# Patient Record
Sex: Female | Born: 1950
Health system: Southern US, Community
[De-identification: ages and names within clinical notes are randomized; demographics above are authoritative.]

## PROBLEM LIST (undated history)

## (undated) DIAGNOSIS — I4510 Unspecified right bundle-branch block: Secondary | ICD-10-CM

## (undated) DIAGNOSIS — F32A Depression, unspecified: Secondary | ICD-10-CM

## (undated) DIAGNOSIS — F329 Major depressive disorder, single episode, unspecified: Secondary | ICD-10-CM

## (undated) DIAGNOSIS — D649 Anemia, unspecified: Secondary | ICD-10-CM

## (undated) DIAGNOSIS — E785 Hyperlipidemia, unspecified: Secondary | ICD-10-CM

## (undated) DIAGNOSIS — R011 Cardiac murmur, unspecified: Secondary | ICD-10-CM

## (undated) DIAGNOSIS — R7303 Prediabetes: Secondary | ICD-10-CM

## (undated) DIAGNOSIS — G5600 Carpal tunnel syndrome, unspecified upper limb: Secondary | ICD-10-CM

## (undated) DIAGNOSIS — T7840XA Allergy, unspecified, initial encounter: Secondary | ICD-10-CM

## (undated) DIAGNOSIS — J381 Polyp of vocal cord and larynx: Secondary | ICD-10-CM

## (undated) DIAGNOSIS — F17200 Nicotine dependence, unspecified, uncomplicated: Secondary | ICD-10-CM

## (undated) DIAGNOSIS — M1990 Unspecified osteoarthritis, unspecified site: Secondary | ICD-10-CM

## (undated) DIAGNOSIS — K449 Diaphragmatic hernia without obstruction or gangrene: Secondary | ICD-10-CM

## (undated) DIAGNOSIS — K644 Residual hemorrhoidal skin tags: Secondary | ICD-10-CM

## (undated) DIAGNOSIS — K648 Other hemorrhoids: Secondary | ICD-10-CM

## (undated) DIAGNOSIS — F419 Anxiety disorder, unspecified: Secondary | ICD-10-CM

## (undated) DIAGNOSIS — I739 Peripheral vascular disease, unspecified: Secondary | ICD-10-CM

## (undated) DIAGNOSIS — I10 Essential (primary) hypertension: Secondary | ICD-10-CM

## (undated) DIAGNOSIS — K5790 Diverticulosis of intestine, part unspecified, without perforation or abscess without bleeding: Secondary | ICD-10-CM

## (undated) DIAGNOSIS — K5900 Constipation, unspecified: Secondary | ICD-10-CM

## (undated) DIAGNOSIS — D126 Benign neoplasm of colon, unspecified: Secondary | ICD-10-CM

## (undated) DIAGNOSIS — K219 Gastro-esophageal reflux disease without esophagitis: Secondary | ICD-10-CM

## (undated) HISTORY — DX: Diverticulosis of intestine, part unspecified, without perforation or abscess without bleeding: K57.90

## (undated) HISTORY — DX: Diaphragmatic hernia without obstruction or gangrene: K44.9

## (undated) HISTORY — PX: POLYPECTOMY: SHX149

## (undated) HISTORY — DX: Hyperlipidemia, unspecified: E78.5

## (undated) HISTORY — DX: Gastro-esophageal reflux disease without esophagitis: K21.9

## (undated) HISTORY — DX: Unspecified right bundle-branch block: I45.10

## (undated) HISTORY — DX: Depression, unspecified: F32.A

## (undated) HISTORY — DX: Unspecified osteoarthritis, unspecified site: M19.90

## (undated) HISTORY — DX: Benign neoplasm of colon, unspecified: D12.6

## (undated) HISTORY — DX: Essential (primary) hypertension: I10

## (undated) HISTORY — PX: COLONOSCOPY: SHX174

## (undated) HISTORY — PX: JOINT REPLACEMENT: SHX530

## (undated) HISTORY — DX: Allergy, unspecified, initial encounter: T78.40XA

## (undated) HISTORY — DX: Nicotine dependence, unspecified, uncomplicated: F17.200

## (undated) HISTORY — DX: Constipation, unspecified: K59.00

## (undated) HISTORY — PX: TUBAL LIGATION: SHX77

## (undated) HISTORY — DX: Cardiac murmur, unspecified: R01.1

## (undated) HISTORY — DX: Anemia, unspecified: D64.9

## (undated) HISTORY — PX: OTHER SURGICAL HISTORY: SHX169

## (undated) HISTORY — DX: Major depressive disorder, single episode, unspecified: F32.9

## (undated) HISTORY — DX: Peripheral vascular disease, unspecified: I73.9

## (undated) HISTORY — DX: Residual hemorrhoidal skin tags: K64.4

## (undated) HISTORY — DX: Other hemorrhoids: K64.8

## (undated) HISTORY — PX: HEMORRHOID SURGERY: SHX153

## (undated) HISTORY — DX: Polyp of vocal cord and larynx: J38.1

## (undated) HISTORY — DX: Anxiety disorder, unspecified: F41.9

---

## 1992-06-30 HISTORY — PX: ABDOMINAL HYSTERECTOMY: SHX81

## 2001-08-11 ENCOUNTER — Encounter: Admission: RE | Admit: 2001-08-11 | Discharge: 2001-08-11 | Payer: Self-pay | Admitting: Family Medicine

## 2001-08-11 ENCOUNTER — Encounter: Payer: Self-pay | Admitting: Family Medicine

## 2003-04-13 ENCOUNTER — Encounter: Admission: RE | Admit: 2003-04-13 | Discharge: 2003-04-13 | Payer: Self-pay | Admitting: Family Medicine

## 2003-04-13 ENCOUNTER — Encounter: Payer: Self-pay | Admitting: Family Medicine

## 2003-05-19 ENCOUNTER — Encounter: Admission: RE | Admit: 2003-05-19 | Discharge: 2003-05-19 | Payer: Self-pay | Admitting: Gastroenterology

## 2006-08-28 ENCOUNTER — Ambulatory Visit: Payer: Self-pay

## 2006-08-28 ENCOUNTER — Encounter: Payer: Self-pay | Admitting: Cardiology

## 2007-07-01 HISTORY — PX: COLONOSCOPY W/ POLYPECTOMY: SHX1380

## 2007-08-11 ENCOUNTER — Emergency Department (HOSPITAL_COMMUNITY): Admission: EM | Admit: 2007-08-11 | Discharge: 2007-08-12 | Payer: Self-pay | Admitting: Emergency Medicine

## 2007-08-22 ENCOUNTER — Ambulatory Visit (HOSPITAL_COMMUNITY): Admission: RE | Admit: 2007-08-22 | Discharge: 2007-08-22 | Payer: Self-pay | Admitting: Orthopaedic Surgery

## 2008-06-02 ENCOUNTER — Ambulatory Visit (HOSPITAL_COMMUNITY): Admission: RE | Admit: 2008-06-02 | Discharge: 2008-06-02 | Payer: Self-pay | Admitting: *Deleted

## 2008-06-02 ENCOUNTER — Encounter (INDEPENDENT_AMBULATORY_CARE_PROVIDER_SITE_OTHER): Payer: Self-pay | Admitting: *Deleted

## 2009-07-06 ENCOUNTER — Encounter
Admission: RE | Admit: 2009-07-06 | Discharge: 2009-07-06 | Payer: Self-pay | Admitting: Physical Medicine and Rehabilitation

## 2009-08-16 ENCOUNTER — Inpatient Hospital Stay (HOSPITAL_COMMUNITY): Admission: RE | Admit: 2009-08-16 | Discharge: 2009-08-18 | Payer: Self-pay | Admitting: Neurosurgery

## 2009-08-16 HISTORY — PX: SPINE SURGERY: SHX786

## 2010-09-19 LAB — BASIC METABOLIC PANEL
BUN: 3 mg/dL — ABNORMAL LOW (ref 6–23)
CO2: 32 mEq/L (ref 19–32)
Calcium: 9.9 mg/dL (ref 8.4–10.5)
Chloride: 99 mEq/L (ref 96–112)
Creatinine, Ser: 0.83 mg/dL (ref 0.4–1.2)
GFR calc Af Amer: >60 mL/min (ref >60)
GFR calc non Af Amer: >60 mL/min (ref >60)
Glucose, Bld: 104 mg/dL — ABNORMAL HIGH (ref 70–99)
Potassium: 3.5 mEq/L (ref 3.5–5.1)
Sodium: 138 mEq/L (ref 135–145)

## 2010-09-19 LAB — CBC
HCT: 40.4 % (ref 36.0–46.0)
Hemoglobin: 13.6 g/dL (ref 12.0–15.0)
MCHC: 33.7 g/dL (ref 30.0–36.0)
MCV: 86.6 fL (ref 78.0–100.0)
Platelets: 286 10*3/uL (ref 150–400)
RBC: 4.66 MIL/uL (ref 3.87–5.11)
RDW: 13 % (ref 11.5–15.5)
WBC: 8.3 10*3/uL (ref 4.0–10.5)

## 2010-09-19 LAB — TYPE AND SCREEN
ABO/RH(D): B POS
Antibody Screen: NEGATIVE

## 2010-09-19 LAB — ABO/RH: ABO/RH(D): B POS

## 2010-11-12 NOTE — Op Note (Signed)
NAMEIQRA, Hannah [name]               ACCOUNT NO.:  [id number]   MEDICAL RECORD NO.:  [id number]          PATIENT TYPE:  AMB   LOCATION:  DAY                          FACILITY:  [facility]   PHYSICIAN:  Alfonse Ras, MD   DATE OF BIRTH:  Dec 06, 1950   DATE OF PROCEDURE:  DATE OF DISCHARGE:                               OPERATIVE REPORT   PREOPERATIVE DIAGNOSES:  Fibroepithelial polyp and grade 3 internal  hemorrhoids with prolapse.   PROCEDURE:  Excision of hemorrhoidal polyp and PPH rectopexy.   SURGEON:  Alfonse Ras, M.D.   ANESTHESIA:  General.   ASSISTANT:  Lennie Muckle, M.D.   DESCRIPTION:  After informed consent was granted, the patient was taken  to the operating room and general anesthesia was induced.  Perianal and  rectal prep were undertaken.  Anal dilatation was accomplished to three  fingerbreadths and the hemorrhoidal bundles were injected with 0.25  Marcaine with Wydase.  The large posterior hemorrhoidal polyp was  clamped at its base and excised.  This was oversewn with a running 3-0  chromic suture.   A 2-0 Prolene purse-string suture was placed in submucosa approximately  5 cm proximal to the dentate line and circumferentially.  PPH stapler  was introduced and the purse-string was tied down around the anvil.  This was closed down, held in place for 45 seconds.  The vagina was  checked.  There was no evidence of rectovaginal involvement with a  stapler.  This was fired.  Staple line was inspected, was felt to be at  least 2.5 cm proximal to the dentate line.  Adequate hemostasis was  ensured.  Gelfoam packing was placed.  Internal and external sphincter  muscles were injected with the remaining 0.25% Marcaine.  The patient  tolerated the procedure well, went to PACU in good condition.      Alfonse Ras, MD  Electronically Signed     KRE/MEDQ  D:  06/02/2008  T:  06/02/2008  Job:  [id number]

## 2011-04-04 LAB — BASIC METABOLIC PANEL
BUN: 5 mg/dL — ABNORMAL LOW (ref 6–23)
CO2: 30 mEq/L (ref 19–32)
Calcium: 9.1 mg/dL (ref 8.4–10.5)
Chloride: 102 mEq/L (ref 96–112)
Creatinine, Ser: 0.73 mg/dL (ref 0.4–1.2)
GFR calc Af Amer: >60 mL/min (ref >60)
GFR calc non Af Amer: >60 mL/min (ref >60)
Glucose, Bld: 113 mg/dL — ABNORMAL HIGH (ref 70–99)
Potassium: 2.7 mEq/L — CL (ref 3.5–5.1)
Sodium: 140 mEq/L (ref 135–145)

## 2011-04-04 LAB — HEMOGLOBIN AND HEMATOCRIT, BLOOD
HCT: 38.9 % (ref 36.0–46.0)
Hemoglobin: 12.7 g/dL (ref 12.0–15.0)

## 2011-06-26 ENCOUNTER — Ambulatory Visit (INDEPENDENT_AMBULATORY_CARE_PROVIDER_SITE_OTHER): Payer: PRIVATE HEALTH INSURANCE

## 2011-06-26 DIAGNOSIS — M2340 Loose body in knee, unspecified knee: Secondary | ICD-10-CM

## 2011-06-26 DIAGNOSIS — M25569 Pain in unspecified knee: Secondary | ICD-10-CM

## 2011-10-01 ENCOUNTER — Ambulatory Visit (INDEPENDENT_AMBULATORY_CARE_PROVIDER_SITE_OTHER): Payer: PRIVATE HEALTH INSURANCE | Admitting: Emergency Medicine

## 2011-10-01 VITALS — BP 166/87 | HR 97 | Temp 98.2°F | Resp 18 | Ht 61.0 in | Wt 124.5 lb

## 2011-10-01 DIAGNOSIS — J40 Bronchitis, not specified as acute or chronic: Secondary | ICD-10-CM

## 2011-10-01 DIAGNOSIS — J209 Acute bronchitis, unspecified: Secondary | ICD-10-CM

## 2011-10-01 DIAGNOSIS — J029 Acute pharyngitis, unspecified: Secondary | ICD-10-CM

## 2011-10-01 LAB — POCT INFLUENZA A/B
Influenza A, POC: NEGATIVE
Influenza B, POC: NEGATIVE

## 2011-10-01 LAB — POCT RAPID STREP A (OFFICE): Rapid Strep A Screen: NEGATIVE

## 2011-10-01 MED ORDER — LEVOFLOXACIN 500 MG PO TABS: 500.0000 mg | ORAL_TABLET | Freq: Every day | ORAL | Status: AC

## 2011-10-01 MED ORDER — BENZONATATE 200 MG PO CAPS: 200.0000 mg | ORAL_CAPSULE | Freq: Three times a day (TID) | ORAL | Status: AC | PRN

## 2011-10-01 NOTE — Progress Notes (Signed)
  Subjective:    Patient ID: Hannah [name], female    DOB: Dec 14, 1950, 61 y.o.   MRN: [id number]  HPI patient enters with a one-week history of head congestion sore throat myalgias. She states she does not think she's had a fever but she has had chills. She feels weak no energy with a dry nonproductive cough.    Review of Systems noncontributory except as relates to the present illness     Objective:   Physical Exam  Constitutional: She appears well-developed and well-nourished.  HENT:  Right Ear: External ear normal.  Left Ear: External ear normal.  Eyes: Pupils are equal, round, and reactive to light.  Neck: Normal range of motion. No tracheal deviation present. No thyromegaly present.       Patient has bilateral anterior cervical nodes and a few shotty posterior cervical nodes  Cardiovascular: Normal rate and regular rhythm.   Pulmonary/Chest: No respiratory distress. She has no wheezes. She has no rales.  Lymphadenopathy:    She has cervical adenopathy.          Assessment & Plan:

## 2011-11-07 ENCOUNTER — Ambulatory Visit (INDEPENDENT_AMBULATORY_CARE_PROVIDER_SITE_OTHER): Payer: PRIVATE HEALTH INSURANCE | Admitting: Family Medicine

## 2011-11-07 ENCOUNTER — Ambulatory Visit: Payer: PRIVATE HEALTH INSURANCE

## 2011-11-07 VITALS — BP 168/83 | HR 85 | Temp 98.6°F | Resp 18 | Ht 61.25 in | Wt 124.0 lb

## 2011-11-07 DIAGNOSIS — M1710 Unilateral primary osteoarthritis, unspecified knee: Secondary | ICD-10-CM

## 2011-11-07 DIAGNOSIS — M79642 Pain in left hand: Secondary | ICD-10-CM

## 2011-11-07 DIAGNOSIS — M25569 Pain in unspecified knee: Secondary | ICD-10-CM

## 2011-11-07 DIAGNOSIS — M79609 Pain in unspecified limb: Secondary | ICD-10-CM

## 2011-11-07 DIAGNOSIS — IMO0002 Reserved for concepts with insufficient information to code with codable children: Secondary | ICD-10-CM

## 2011-11-07 DIAGNOSIS — M179 Osteoarthritis of knee, unspecified: Secondary | ICD-10-CM

## 2011-11-07 DIAGNOSIS — M25559 Pain in unspecified hip: Secondary | ICD-10-CM

## 2011-11-07 MED ORDER — ROBAXIN 500 MG PO TABS: 500.0000 mg | ORAL_TABLET | Freq: Two times a day (BID) | ORAL | Status: AC | PRN

## 2011-11-07 NOTE — Progress Notes (Signed)
[facility]:  Office Visit  Chief Complaint:  Chief Complaint  Patient presents with  . Hip Pain    fell on right side, is in a lot of pain    HPI: Hannah [name] is a 61 y.o. female who complains of  : 1. Bilateral hip pain s/p fall 2. Bilateral knee pain x 3 weeks s/p fall. 3. Left hand pain s/p fall  She fell out of SUV trying to get in b/c too tall. No weakness. + numbness on left hand. + pain, throbiing, constant. Worse when walking. H/o Oa but not osteoporosis.   Past Medical History  Diagnosis Date  . Anxiety   . Depression   . Arthritis   . Hypertension    Past Surgical History  Procedure Date  . Spine surgery    History   Social History  . Marital Status: Divorced    Spouse Name: N/A    Number of Children: N/A  . Years of Education: N/A   Social History Main Topics  . Smoking status: Current Everyday Smoker -- 0.2 packs/day for 20 years    Types: Cigarettes  . Smokeless tobacco: Never Used  . Alcohol Use: No  . Drug Use: No  . Sexually Active: None   Other Topics Concern  . None   Social History Narrative  . None   Family History  Problem Relation Age of Onset  . Cancer Father    Allergies  Allergen Reactions  . Amlodipine   . Chantix (Varenicline Tartrate) Other (See Comments)    Tongue swell,sob  . Clarithromycin Rash  . Lisinopril Hives  . Simvastatin Hives  . Wellbutrin (Bupropion Hcl) Hives   Prior to Admission medications   Medication Sig Start Date End Date Taking? Authorizing Provider  ALPRAZolam Prudy Feeler) 0.5 MG tablet Take 0.5 mg by mouth at bedtime as needed.   Yes Historical Provider, MD  divalproex (DEPAKOTE) 500 MG DR tablet Take 500 mg by mouth 1 day or 1 dose.   Yes Historical Provider, MD  estradiol (ESTRACE) 1 MG tablet Take 1 mg by mouth daily.   Yes Historical Provider, MD  fenofibrate 54 MG tablet Take 54 mg by mouth daily.   Yes Historical Provider, MD  lamoTRIgine (LAMICTAL) 150 MG tablet Take  150 mg by mouth daily.   Yes Historical Provider, MD  PARoxetine (PAXIL) 10 MG tablet Take 5 mg by mouth every morning.   Yes Historical Provider, MD     ROS: The patient denies fevers, chills, night sweats, unintentional weight loss, chest pain, palpitations, wheezing, dyspnea on exertion, nausea, vomiting, abdominal pain, dysuria, hematuria, melena, numbness, weakness, or tingling. + knee pain  All other systems have been reviewed and were otherwise negative with the exception of those mentioned in the HPI and as above.    PHYSICAL EXAM: Filed Vitals:   11/07/11 1533  BP: 168/83  Pulse: 85  Temp: 98.6 F (37 C)  Resp: 18   Filed Vitals:   11/07/11 1533  Height: 5' 1.25" (1.556 m)  Weight: 124 lb (56.246 kg)   Body mass index is 23.24 kg/(m^2).  General: Alert, no acute distress HEENT:  Normocephalic, atraumatic, oropharynx patent.  Cardiovascular:  Regular rate and rhythm, no rubs murmurs or gallops.  No Carotid bruits, radial pulse intact. No pedal edema.  Respiratory: Clear to auscultation bilaterally.  No wheezes, rales, or rhonchi.  No cyanosis, no use of accessory musculature GI: No organomegaly, abdomen is soft and non-tender, positive  bowel sounds.  No masses. Skin: No rashes. Neurologic: Facial musculature symmetric. Psychiatric: Patient is appropriate throughout our interaction. Lymphatic: No cervical lymphadenopathy Musculoskeletal: Gait intact. Bilateral knee: No effusion, decrease PROM/AROM in flexion, extension due to pain, + anterior knee tenderness on palpation specifically medial compartment, neg McMurray/jt line tenderness, Neg Lachmans Neurovascualrly intact: post tib a/BP , senstation, 5/5 strength. 2/2 DTr intact   LABS:    EKG/XRAY:   Primary read interpreted by Dr. Conley Rolls at [facility]. No fx/dislocation  DJD of knee bilaterally, djd of hip, sacroilitis  ASSESSMENT/PLAN: Encounter Diagnoses  Name Primary?  . Hip pain Yes  . Knee pain   . Hand pain,  left   . Osteoarthritis of knee    Knee and hand pain related to OA Patient verbally consented to bilateral knee injections. Risks and benefits d/w patient. No h/o diabetes. 1 ml of Depomedrol and 2 ml of 1% lidocaine without epi.  Per injection. Lateral knee approach. Prepped with betadine and alcohol. No complications.   Rx for Robaxen 500 mg BID prn She still has some hydrocodone from Dr. Renae Fickle, will not prescribe any narcotic meds.  Advise to check BP at home, if continues to be greater than 140/90 consistently then need f/u for BP meds.   Hamilton Capri PHUONG, DO 11/09/2011 6:39 AM

## 2011-12-29 ENCOUNTER — Ambulatory Visit (INDEPENDENT_AMBULATORY_CARE_PROVIDER_SITE_OTHER): Payer: PRIVATE HEALTH INSURANCE | Admitting: Internal Medicine

## 2011-12-29 VITALS — BP 154/96 | HR 84 | Temp 98.9°F | Resp 16 | Ht 62.0 in | Wt 126.0 lb

## 2011-12-29 DIAGNOSIS — Z720 Tobacco use: Secondary | ICD-10-CM

## 2011-12-29 DIAGNOSIS — J029 Acute pharyngitis, unspecified: Secondary | ICD-10-CM

## 2011-12-29 DIAGNOSIS — R490 Dysphonia: Secondary | ICD-10-CM

## 2011-12-29 DIAGNOSIS — J329 Chronic sinusitis, unspecified: Secondary | ICD-10-CM

## 2011-12-29 DIAGNOSIS — I10 Essential (primary) hypertension: Secondary | ICD-10-CM

## 2011-12-29 DIAGNOSIS — D649 Anemia, unspecified: Secondary | ICD-10-CM

## 2011-12-29 LAB — POCT CBC
Granulocyte percent: 46.9 %G (ref 37–80)
HCT, POC: 38.5 % (ref 37.7–47.9)
Hemoglobin: 11.3 g/dL — AB (ref 12.2–16.2)
Lymph, poc: 2.8 (ref 0.6–3.4)
MCH, POC: 25.7 pg — AB (ref 27–31.2)
MCHC: 29.4 g/dL — AB (ref 31.8–35.4)
MCV: 87.8 fL (ref 80–97)
MID (cbc): 0.5 (ref 0–0.9)
MPV: 8.3 fL (ref 0–99.8)
POC Granulocyte: 2.9 (ref 2–6.9)
POC LYMPH PERCENT: 45.1 %L (ref 10–50)
POC MID %: 8 %M (ref 0–12)
Platelet Count, POC: 269 10*3/uL (ref 142–424)
RBC: 4.39 M/uL (ref 4.04–5.48)
RDW, POC: 14.9 %
WBC: 6.1 10*3/uL (ref 4.6–10.2)

## 2011-12-29 LAB — POCT RAPID STREP A (OFFICE): Rapid Strep A Screen: NEGATIVE

## 2011-12-29 MED ORDER — AMOXICILLIN 875 MG PO TABS: 875.0000 mg | ORAL_TABLET | Freq: Two times a day (BID) | ORAL | Status: AC

## 2011-12-29 MED ORDER — BISOPROLOL-HYDROCHLOROTHIAZIDE 5-6.25 MG PO TABS: 1.0000 | ORAL_TABLET | Freq: Every day | ORAL | Status: DC

## 2011-12-29 NOTE — Progress Notes (Signed)
  Subjective:    Patient ID: Hannah [name], female    DOB: [birth year], 61 y.o.   MRN: [id number]  HPIHas a five-day history of headache with fullness in the ears and mild dizziness, nasal congestion with postnasal drainage, and mild sore throat. She also is concerned about prolonged worse with which was identified 4 years ago as being secondary to vocal polyps. She has not followed up with ear nose and throat and would like to see him again . Continues to smoke.  She has a history of hypertension but she discontinued her medicines last year.  She has a history of diagnosis of mood disorder treated with Depakote Lamictal and Paxil, but she was so lethargic on the medicine she stopped those last year as well and has actually done very well since then.    Review of SystemsThere is no problem list on file for this patient. No fever chills or night sweats No weight loss No cough No palpitations chest pain No peripheral edema       Objective:   Physical Exam In no acute distress Blood pressure 154/96 HEENT= pupils equal round reactive to light and accommodation with intact extraocular movements  TMs clear/nares Boggy with increased purulence/throat slightly injected No thyromegaly or lymphadenopathy Chest clear Heart regular with 2/6 systolic ejection murmur along the left sternal border and radiating to the carotids/she says this has been present for a long time/the chart reveals bilateral carotid stenosis with left worse than right/workup in [facility] has shown moderate aortic regurg with a sclerotic aortic valve and mild mitral regurg  No edema Neurological intact      Assessment & Plan:  Problem #1 sinusitis-With headache and pharyngitis Amoxicillin  Problem #2 hypertension Ziac 5/6.25  Problem #3 focal polyps Will arrange followup with Dr. Pollyann Kennedy  Problem #4 aortic regurg/carotid stenosis He is to followup with Dr. Alanda Amass  Problem #5 continued  nicotine abuse  2 followup here for blood pressure in 3 weeks

## 2011-12-30 ENCOUNTER — Telehealth: Payer: Self-pay

## 2011-12-30 NOTE — Telephone Encounter (Signed)
Ok for note today 

## 2011-12-30 NOTE — Telephone Encounter (Signed)
OOW note in pick up drawer, [facility] notifying patient.

## 2011-12-30 NOTE — Telephone Encounter (Signed)
Is it ok to give a work note.

## 2011-12-30 NOTE — Telephone Encounter (Signed)
PT STATES SHE WAS SEEN YESTERDAY AND DIDN'T GO TO WORK TODAY BECAUSE SHE STILL WASN'T FEELING GOOD NEED TO HAVE AN OOW NOTE. PLEASE CALL [phone]

## 2012-08-18 ENCOUNTER — Encounter: Payer: Self-pay | Admitting: Emergency Medicine

## 2012-10-08 ENCOUNTER — Ambulatory Visit: Payer: PRIVATE HEALTH INSURANCE

## 2012-10-08 ENCOUNTER — Ambulatory Visit (INDEPENDENT_AMBULATORY_CARE_PROVIDER_SITE_OTHER): Payer: PRIVATE HEALTH INSURANCE | Admitting: Emergency Medicine

## 2012-10-08 VITALS — BP 157/74 | HR 72 | Temp 98.2°F | Resp 18 | Ht 61.5 in | Wt 121.8 lb

## 2012-10-08 DIAGNOSIS — M25559 Pain in unspecified hip: Secondary | ICD-10-CM

## 2012-10-08 DIAGNOSIS — M25551 Pain in right hip: Secondary | ICD-10-CM

## 2012-10-08 DIAGNOSIS — M1990 Unspecified osteoarthritis, unspecified site: Secondary | ICD-10-CM | POA: Insufficient documentation

## 2012-10-08 DIAGNOSIS — I10 Essential (primary) hypertension: Secondary | ICD-10-CM

## 2012-10-08 DIAGNOSIS — M25569 Pain in unspecified knee: Secondary | ICD-10-CM

## 2012-10-08 DIAGNOSIS — J329 Chronic sinusitis, unspecified: Secondary | ICD-10-CM

## 2012-10-08 DIAGNOSIS — F39 Unspecified mood [affective] disorder: Secondary | ICD-10-CM | POA: Insufficient documentation

## 2012-10-08 DIAGNOSIS — M25561 Pain in right knee: Secondary | ICD-10-CM

## 2012-10-08 MED ORDER — BISOPROLOL-HYDROCHLOROTHIAZIDE 5-6.25 MG PO TABS: 1.0000 | ORAL_TABLET | Freq: Every day | ORAL | Status: DC

## 2012-10-08 MED ORDER — HYDROCODONE-ACETAMINOPHEN 5-325 MG PO TABS: 1.0000 | ORAL_TABLET | Freq: Four times a day (QID) | ORAL | Status: DC | PRN

## 2012-10-08 NOTE — Progress Notes (Signed)
  Subjective:    Patient ID: Hannah [name], female    DOB: 1950/12/08, 62 y.o.   MRN: [id number]  HPI Patient presents with pain in right knee x 2 weeks. Patient states there is fluid on her knee that causes the pain. She is also experiencing pain in her right hip from a fall one month ago. Patient has chronic problems with her right knee. She had an injection done in December by Dr. Renae Fickle and had fluid drawn off her knee as well as in injection of cortisone. She now has increasing knee pain but is having more pain in her lateral hip following her fall a month ago.  Review of Systems     Objective:   Physical Exam There is significant degenerative changes of both knees. There is inability to get full extension of the right knee there is a small amount of fluid in the knee itself. Drawer sign is negative McMurray testing is negative There is limited internal and external rotation of the right hip. There is significant tenderness over the trochanteric bursa.  The medial knee was prepped with Betadine numbed with ethyl chloride as well as 1 cc of 2% plain a 22-gauge needle was inserted and 10 cc of slightly yellow somewhat thickened fluid was removed and sent for analysis the   [facility] reading (PRIMARY) by  Dr.Kinza Gouveia there are degenerative changes around the right hip no fracture is seen.   Assessment & Plan:  Patient will take 3 Advil twice a day. I did give her #20 Norco to have her pain she will followup with Dr. Renae Fickle the end of May and have consideration for repeat steroid injection

## 2012-10-09 ENCOUNTER — Telehealth: Payer: Self-pay

## 2012-10-09 LAB — SYNOVIAL FLUID PANEL
Crystals, Fluid: NONE SEEN
Eosinophils-Synovial: 0 % (ref 0–1)
Glucose, Synovial Fluid: 74 mg/dL
Lymphocytes-Synovial Fld: 95 % — ABNORMAL HIGH (ref 0–20)
Monocyte/Macrophage: 0 % — ABNORMAL LOW (ref 50–90)
Neutrophil, Synovial: 5 % (ref 0–25)
Total protein, fluid: <3 g/dL
WBC, Synovial: 695 cu mm — ABNORMAL HIGH (ref 0–200)

## 2012-10-09 NOTE — Telephone Encounter (Signed)
Per pt seen by Dr Cleta Alberts, has questions about problem sweating with very bad odor. Please call Pt back to discuss. [phone]

## 2012-10-10 NOTE — Telephone Encounter (Signed)
Spoke with pt advised message from Dr. Daub. Pt understood. 

## 2012-10-10 NOTE — Telephone Encounter (Signed)
LMOM to CB. Dr Cleta Alberts states he can see her for this if she will RTC. It is sometimes caused by the medications people take. This would require another office visit to discuss.

## 2012-10-12 LAB — BODY FLUID CULTURE
Gram Stain: NONE SEEN
Organism ID, Bacteria: NO GROWTH

## 2012-11-24 ENCOUNTER — Encounter: Payer: Self-pay | Admitting: Emergency Medicine

## 2012-11-29 ENCOUNTER — Telehealth: Payer: Self-pay | Admitting: Family Medicine

## 2012-11-29 MED ORDER — ESTRADIOL 1 MG PO TABS: 1.0000 mg | ORAL_TABLET | Freq: Every day | ORAL | Status: DC

## 2012-11-29 NOTE — Telephone Encounter (Signed)
Rx Refilled  

## 2012-12-15 ENCOUNTER — Encounter: Payer: Self-pay | Admitting: Family Medicine

## 2012-12-15 ENCOUNTER — Ambulatory Visit (INDEPENDENT_AMBULATORY_CARE_PROVIDER_SITE_OTHER): Payer: PRIVATE HEALTH INSURANCE | Admitting: Family Medicine

## 2012-12-15 VITALS — BP 120/80 | HR 68 | Temp 97.7°F | Resp 18 | Ht 60.0 in | Wt 122.0 lb

## 2012-12-15 DIAGNOSIS — Z124 Encounter for screening for malignant neoplasm of cervix: Secondary | ICD-10-CM

## 2012-12-15 DIAGNOSIS — Z08 Encounter for follow-up examination after completed treatment for malignant neoplasm: Secondary | ICD-10-CM

## 2012-12-15 DIAGNOSIS — F17200 Nicotine dependence, unspecified, uncomplicated: Secondary | ICD-10-CM

## 2012-12-15 DIAGNOSIS — K5909 Other constipation: Secondary | ICD-10-CM

## 2012-12-15 DIAGNOSIS — Z720 Tobacco use: Secondary | ICD-10-CM

## 2012-12-15 DIAGNOSIS — K5900 Constipation, unspecified: Secondary | ICD-10-CM

## 2012-12-15 DIAGNOSIS — B977 Papillomavirus as the cause of diseases classified elsewhere: Secondary | ICD-10-CM

## 2012-12-15 DIAGNOSIS — Z01419 Encounter for gynecological examination (general) (routine) without abnormal findings: Secondary | ICD-10-CM

## 2012-12-15 DIAGNOSIS — Z13820 Encounter for screening for osteoporosis: Secondary | ICD-10-CM

## 2012-12-15 NOTE — Patient Instructions (Addendum)
Medical release for her psychiatrist- for labs and last office visit  Bone Density to be set up We will call if more labs are needed Work on the smoking Try senna-kot in the evening F/U 6 months

## 2012-12-16 DIAGNOSIS — B977 Papillomavirus as the cause of diseases classified elsewhere: Secondary | ICD-10-CM | POA: Insufficient documentation

## 2012-12-16 DIAGNOSIS — K5909 Other constipation: Secondary | ICD-10-CM | POA: Insufficient documentation

## 2012-12-16 DIAGNOSIS — K5900 Constipation, unspecified: Secondary | ICD-10-CM | POA: Insufficient documentation

## 2012-12-16 NOTE — Assessment & Plan Note (Signed)
Discussed tobacco cessation 

## 2012-12-16 NOTE — Assessment & Plan Note (Signed)
She has used a few meds, often will not take in AM due to her job Advised sennakot

## 2012-12-16 NOTE — Assessment & Plan Note (Signed)
Pap Smear with HPV to be done

## 2012-12-16 NOTE — Progress Notes (Signed)
  Subjective:    Patient ID: Hannah [name], female    DOB: 1951/04/06, 62 y.o.   MRN: [id number]  HPI  Pt here for GYN exam. History of hysterectomy secondary to fibroids and bleeding. She has had positive HPV and has continued to have yearly PAP exams. Last year PAP Smear normal , +High risk HPV Mammogram is UTD, immunizations UTD Pt reports colonoscopy , records show she needs in 2-3 years Currently being seen by pyschiatry, medications reviewed. She had fasting labs done by psych 4 weeks ago per report. Due for Bone Density  Review of Systems    GEN- denies fatigue, fever, weight loss,weakness, recent illness HEENT- denies eye drainage, change in vision, nasal discharge, CVS- denies chest pain, palpitations RESP- denies SOB, cough, wheeze ABD- denies N/V, change in stools, abd pain, +constipation GU- denies dysuria, hematuria, dribbling, incontinence MSK- denies joint pain, muscle aches, injury Neuro- denies headache, dizziness, syncope, seizure activity      Objective:   Physical Exam  GEN- NAD, alert and oriented x3 HEENT- PERRL, EOMI, non injected sclera, pink conjunctiva, MMM, oropharynx clear Neck- Supple, no thyromegaly CVS- RRR, ?soft 2/6 SEM- sternal border  RESP-CTAB ABD-NABS,soft,NT,ND Breast- normal symmetry, no nipple inversion,no nipple drainage, no nodules or lumps felt Nodes- no axillary nodes GU- normal external genitalia, vaginal mucosa pink and moist no lesions seen, cervix and uterus absent, ovaries not palpable, urethra normal position, bladder in tact  EXT- No edema Pulses- Radial, DP- 2+         Assessment & Plan:    GYN Exam- PAP Smear done on vaginal cuff, High RIsk HPV surveillance. Mammo UTD  Send for Bone Density. Obtain labs from her psychiatrist, if Lipids missing pt will return to have done. Non fasting today. I can not locate her colonoscopy. Needs colonoscopy in 2015 based on previous notes

## 2012-12-17 ENCOUNTER — Other Ambulatory Visit: Payer: Self-pay | Admitting: Family Medicine

## 2012-12-17 DIAGNOSIS — E785 Hyperlipidemia, unspecified: Secondary | ICD-10-CM

## 2012-12-17 LAB — PAP IG AND HPV HIGH-RISK: HPV DNA High Risk: NOT DETECTED

## 2012-12-20 ENCOUNTER — Other Ambulatory Visit: Payer: Self-pay | Admitting: Family Medicine

## 2012-12-20 DIAGNOSIS — Z1211 Encounter for screening for malignant neoplasm of colon: Secondary | ICD-10-CM

## 2012-12-31 ENCOUNTER — Ambulatory Visit (INDEPENDENT_AMBULATORY_CARE_PROVIDER_SITE_OTHER): Payer: PRIVATE HEALTH INSURANCE | Admitting: Emergency Medicine

## 2012-12-31 VITALS — BP 118/68 | HR 60 | Temp 98.0°F | Resp 17 | Ht 61.5 in | Wt 122.0 lb

## 2012-12-31 DIAGNOSIS — F0630 Mood disorder due to known physiological condition, unspecified: Secondary | ICD-10-CM

## 2012-12-31 DIAGNOSIS — R5381 Other malaise: Secondary | ICD-10-CM

## 2012-12-31 DIAGNOSIS — R400 Somnolence: Secondary | ICD-10-CM

## 2012-12-31 DIAGNOSIS — I10 Essential (primary) hypertension: Secondary | ICD-10-CM

## 2012-12-31 DIAGNOSIS — R5383 Other fatigue: Secondary | ICD-10-CM

## 2012-12-31 DIAGNOSIS — D649 Anemia, unspecified: Secondary | ICD-10-CM

## 2012-12-31 DIAGNOSIS — R404 Transient alteration of awareness: Secondary | ICD-10-CM

## 2012-12-31 LAB — POCT CBC
Granulocyte percent: 54.7 %G (ref 37–80)
HCT, POC: 36.6 % — AB (ref 37.7–47.9)
Hemoglobin: 10.8 g/dL — AB (ref 12.2–16.2)
Lymph, poc: 1.8 (ref 0.6–3.4)
MCH, POC: 27.1 pg (ref 27–31.2)
MCHC: 29.5 g/dL — AB (ref 31.8–35.4)
MCV: 91.8 fL (ref 80–97)
MID (cbc): 0.4 (ref 0–0.9)
MPV: 8 fL (ref 0–99.8)
POC Granulocyte: 2.7 (ref 2–6.9)
POC LYMPH PERCENT: 37.3 %L (ref 10–50)
POC MID %: 8 %M (ref 0–12)
Platelet Count, POC: 213 10*3/uL (ref 142–424)
RBC: 3.99 M/uL — AB (ref 4.04–5.48)
RDW, POC: 13.9 %
WBC: 4.9 10*3/uL (ref 4.6–10.2)

## 2012-12-31 LAB — COMPREHENSIVE METABOLIC PANEL
ALT: 12 U/L (ref 0–35)
AST: 18 U/L (ref 0–37)
Albumin: 4.3 g/dL (ref 3.5–5.2)
Alkaline Phosphatase: 59 U/L (ref 39–117)
BUN: 8 mg/dL (ref 6–23)
CO2: 32 mEq/L (ref 19–32)
Calcium: 9.7 mg/dL (ref 8.4–10.5)
Chloride: 105 mEq/L (ref 96–112)
Creat: 0.92 mg/dL (ref 0.50–1.10)
Glucose, Bld: 103 mg/dL — ABNORMAL HIGH (ref 70–99)
Potassium: 4.6 mEq/L (ref 3.5–5.3)
Sodium: 144 mEq/L (ref 135–145)
Total Bilirubin: 0.3 mg/dL (ref 0.3–1.2)
Total Protein: 6.6 g/dL (ref 6.0–8.3)

## 2012-12-31 LAB — T4, FREE: Free T4: 0.99 ng/dL (ref 0.80–1.80)

## 2012-12-31 LAB — TSH: TSH: 1.062 u[IU]/mL (ref 0.350–4.500)

## 2012-12-31 LAB — GLUCOSE, POCT (MANUAL RESULT ENTRY): POC Glucose: 102 mg/dl — AB (ref 70–99)

## 2012-12-31 MED ORDER — PAROXETINE HCL 10 MG PO TABS: 10.0000 mg | ORAL_TABLET | ORAL | Status: DC

## 2012-12-31 NOTE — Progress Notes (Signed)
  Subjective:    Patient ID: Hannah [name], female    DOB: 26-Jun-1951, 62 y.o.   MRN: [id number]  HPI patient carries a diagnosis of mood disorder she enters today for a checkup. Her biggest problem is that she is extremely sleepy through the day. She has been able to get off of chronic pain medication use. She is sleepy all day. For a long time she was addicted to pain medications. She is currently under the care of Dr. Tomasa Rand for her mood disorder and family is concerned that she is too sleepy during the day due to her medications the.    Review of Systems     Objective:   Physical Exam patient is alert and cooperative in no distress neck is supple. Chest clear heart regular rate no murmurs.  Results for orders placed in visit on 12/31/12  POCT CBC      Result Value Range   WBC 4.9  4.6 - 10.2 K/uL   Lymph, poc 1.8  0.6 - 3.4   POC LYMPH PERCENT 37.3  10 - 50 %L   MID (cbc) 0.4  0 - 0.9   POC MID % 8.0  0 - 12 %M   POC Granulocyte 2.7  2 - 6.9   Granulocyte percent 54.7  37 - 80 %G   RBC 3.99 (*) 4.04 - 5.48 M/uL   Hemoglobin 10.8 (*) 12.2 - 16.2 g/dL   HCT, POC 78.2 (*) 95.6 - 47.9 %   MCV 91.8  80 - 97 fL   MCH, POC 27.1  27 - 31.2 pg   MCHC 29.5 (*) 31.8 - 35.4 g/dL   RDW, POC 21.3     Platelet Count, POC 213  142 - 424 K/uL   MPV 8.0  0 - 99.8 fL  GLUCOSE, POCT (MANUAL RESULT ENTRY)      Result Value Range   POC Glucose 102 (*) 70 - 99 mg/dl        Assessment & Plan:  Patient has daytime somnolence. I suspect these are medication related. She would like a second opinion regarding her diagnosis of a mood disorder. Referral made to Dr. Donell Beers for his attenuation. In the interim we'll go ahead and check routine labs decrease Paxil to 10 mg a day. She is slightly anemic at 10.8 have advised her to take him all the vitamin with iron one a day. She's going to schedule a complete physical .

## 2013-01-04 ENCOUNTER — Other Ambulatory Visit: Payer: Self-pay | Admitting: Family Medicine

## 2013-01-04 DIAGNOSIS — Z13820 Encounter for screening for osteoporosis: Secondary | ICD-10-CM

## 2013-01-27 ENCOUNTER — Other Ambulatory Visit: Payer: Self-pay | Admitting: Family Medicine

## 2013-01-27 DIAGNOSIS — E2839 Other primary ovarian failure: Secondary | ICD-10-CM

## 2013-02-09 ENCOUNTER — Encounter: Payer: Self-pay | Admitting: Internal Medicine

## 2013-02-09 ENCOUNTER — Ambulatory Visit (INDEPENDENT_AMBULATORY_CARE_PROVIDER_SITE_OTHER): Payer: PRIVATE HEALTH INSURANCE | Admitting: Family Medicine

## 2013-02-09 VITALS — BP 122/68 | HR 50 | Temp 97.9°F | Resp 16 | Ht 61.5 in | Wt 120.0 lb

## 2013-02-09 DIAGNOSIS — M436 Torticollis: Secondary | ICD-10-CM

## 2013-02-09 DIAGNOSIS — R51 Headache: Secondary | ICD-10-CM

## 2013-02-09 MED ORDER — CYCLOBENZAPRINE HCL 5 MG PO TABS: 5.0000 mg | ORAL_TABLET | Freq: Three times a day (TID) | ORAL | Status: DC | PRN

## 2013-02-09 MED ORDER — PREDNISONE 20 MG PO TABS: 20.0000 mg | ORAL_TABLET | Freq: Three times a day (TID) | ORAL | Status: DC

## 2013-02-09 NOTE — Progress Notes (Signed)
62 yo woman with 4 days of right sided headache radiating into right shoulder.  It began upon awakening, no trauma.  Objective:  NAD Neck supple, but stiff and she cannot rotate well to the right. Reflexes in upper extremities are normal Neck is tender at insertion of right SCM muscle  Assessment:  Torticollis. Plan:   Torticollis, acute - Plan: cyclobenzaprine (FLEXERIL) 5 MG tablet, predniSONE (DELTASONE) 20 MG tablet  Signed, Elvina Sidle, MD

## 2013-02-23 ENCOUNTER — Encounter: Payer: Self-pay | Admitting: *Deleted

## 2013-04-29 ENCOUNTER — Telehealth: Payer: Self-pay | Admitting: Family Medicine

## 2013-04-29 NOTE — Telephone Encounter (Signed)
Patient needs her Estradial 1 mg refilled.     [facility] [phone]

## 2013-05-02 ENCOUNTER — Other Ambulatory Visit: Payer: Self-pay | Admitting: Family Medicine

## 2013-05-03 ENCOUNTER — Telehealth: Payer: Self-pay

## 2013-05-03 NOTE — Telephone Encounter (Signed)
PT STATES THAT SHE NO LONGER HAS INSURANCE COVERAGE DUE TO THE LOSS OF HER JOB RECENTLY, SHE WOULD LIKE TO HAVE A REFILL ON estradiol (ESTRACE) 1 MG tablet.  [phone]   [facility] [address]

## 2013-05-05 MED ORDER — ESTRADIOL 1 MG PO TABS: 1.0000 mg | ORAL_TABLET | Freq: Every day | ORAL | Status: DC

## 2013-05-05 NOTE — Telephone Encounter (Signed)
Meds ordered this encounter  Medications  . estradiol (ESTRACE) 1 MG tablet    Sig: Take 1 tablet (1 mg total) by mouth daily.    Dispense:  30 tablet    Refill:  1    Order Specific Question:  Supervising Provider    Answer:  DOOLITTLE, ROBERT P [[id number]]

## 2013-05-05 NOTE — Telephone Encounter (Signed)
Please advise on the request/ the message taken on 11/4 was not routed until today.

## 2013-05-05 NOTE — Telephone Encounter (Signed)
Pt is calling back to check on status of her medication request call back number is [phone]

## 2013-05-06 ENCOUNTER — Encounter: Payer: Self-pay | Admitting: Internal Medicine

## 2013-07-19 ENCOUNTER — Ambulatory Visit (INDEPENDENT_AMBULATORY_CARE_PROVIDER_SITE_OTHER): Payer: No Typology Code available for payment source | Admitting: Internal Medicine

## 2013-07-19 VITALS — BP 110/64 | HR 65 | Temp 98.3°F | Resp 18 | Ht 60.5 in | Wt 122.0 lb

## 2013-07-19 DIAGNOSIS — L089 Local infection of the skin and subcutaneous tissue, unspecified: Secondary | ICD-10-CM

## 2013-07-19 DIAGNOSIS — N760 Acute vaginitis: Secondary | ICD-10-CM

## 2013-07-19 DIAGNOSIS — F17200 Nicotine dependence, unspecified, uncomplicated: Secondary | ICD-10-CM

## 2013-07-19 DIAGNOSIS — R42 Dizziness and giddiness: Secondary | ICD-10-CM

## 2013-07-19 DIAGNOSIS — L723 Sebaceous cyst: Secondary | ICD-10-CM

## 2013-07-19 LAB — POCT WET PREP WITH KOH
KOH Prep POC: NEGATIVE
RBC Wet Prep HPF POC: NEGATIVE
Trichomonas, UA: NEGATIVE
WBC Wet Prep HPF POC: NEGATIVE
Yeast Wet Prep HPF POC: NEGATIVE

## 2013-07-19 MED ORDER — DOXYCYCLINE HYCLATE 100 MG PO TABS: 100.0000 mg | ORAL_TABLET | Freq: Two times a day (BID) | ORAL | Status: DC

## 2013-07-19 MED ORDER — METRONIDAZOLE 0.75 % VA GEL: 1.0000 | Freq: Two times a day (BID) | VAGINAL | Status: DC

## 2013-07-19 NOTE — Progress Notes (Signed)
   Subjective:    Patient ID: Hannah [name], female    DOB: Sep 07, 1950, 63 y.o.   MRN: [id number]  HPI 63 y.o. Female presents to clinic for a tender bump on the outside of her vaginal area.Area is drainning. Is also having trouble with discharge.  Having trouble with dizziness when bending over and going to sit up. Believes it to be due to sinuses. Continues to smoke No drainage, hearing loss. No syncope or falling.   Review of Systems     Objective:   Physical Exam  Vitals reviewed. Constitutional: She is oriented to person, place, and time. She appears well-developed and well-nourished. No distress.  HENT:  Head: Normocephalic.  Right Ear: External ear normal.  Left Ear: External ear normal.  Nose: Nose normal.  Mouth/Throat: Oropharynx is clear and moist.  Eyes: EOM are normal. Pupils are equal, round, and reactive to light.  Neck: Normal range of motion. Neck supple.  Cardiovascular: Normal rate, regular rhythm and normal heart sounds.   No murmur heard. Pulmonary/Chest: Effort normal and breath sounds normal.  Abdominal: Hernia confirmed negative in the right inguinal area and confirmed negative in the left inguinal area.  Genitourinary:    Pelvic exam was performed with patient in the knee-chest position. There is tenderness and lesion on the right labia. There is no rash or injury on the right labia. There is no rash, tenderness, lesion or injury on the left labia. No erythema, tenderness or bleeding around the vagina. No foreign body around the vagina. Vaginal discharge found.  2 sebaceous cysts Mobile cystic , mild tender Less than 1 cm  Musculoskeletal: Normal range of motion.  Lymphadenopathy:    She has no cervical adenopathy.       Right: No inguinal adenopathy present.       Left: No inguinal adenopathy present.  Neurological: She is alert and oriented to person, place, and time. She exhibits normal muscle tone. Coordination abnormal.  Skin: Rash noted.    Psychiatric: She has a normal mood and affect.    Results for orders placed in visit on 07/19/13  POCT WET PREP WITH KOH      Result Value Range   Trichomonas, UA Negative     Clue Cells Wet Prep HPF POC tntc     Epithelial Wet Prep HPF POC tntc     Yeast Wet Prep HPF POC neg     Bacteria Wet Prep HPF POC 2+     RBC Wet Prep HPF POC neg     WBC Wet Prep HPF POC neg     KOH Prep POC Negative           Assessment & Plan:  See family doctor to evaluate dizzyness further See gyn to consider removal of labial cysts Warm compresses and doxycycline for infected sebaceous cyst Vaginitis

## 2013-07-19 NOTE — Progress Notes (Signed)
   Subjective:    Patient ID: Hannah [name], female    DOB: [birth year], 63 y.o.   MRN: [id number]  HPI    Review of Systems     Objective:   Physical Exam        Assessment & Plan:

## 2013-07-19 NOTE — Patient Instructions (Signed)
Epidermal Cyst An epidermal cyst is sometimes called a sebaceous cyst, epidermal inclusion cyst, or infundibular cyst. These cysts usually contain a substance that looks "pasty" or "cheesy" and may have a bad smell. This substance is a protein called keratin. Epidermal cysts are usually found on the face, neck, or trunk. They may also occur in the vaginal area or other parts of the genitalia of both men and women. Epidermal cysts are usually small, painless, slow-growing bumps or lumps that move freely under the skin. It is important not to try to pop them. This may cause an infection and lead to tenderness and swelling. CAUSES  Epidermal cysts may be caused by a deep penetrating injury to the skin or a plugged hair follicle, often associated with acne. SYMPTOMS  Epidermal cysts can become inflamed and cause:  Redness.  Tenderness.  Increased temperature of the skin over the bumps or lumps.  Grayish-white, bad smelling material that drains from the bump or lump. DIAGNOSIS  Epidermal cysts are easily diagnosed by your caregiver during an exam. Rarely, a tissue sample (biopsy) may be taken to rule out other conditions that may resemble epidermal cysts. TREATMENT   Epidermal cysts often get better and disappear on their own. They are rarely ever cancerous.  If a cyst becomes infected, it may become inflamed and tender. This may require opening and draining the cyst. Treatment with antibiotics may be necessary. When the infection is gone, the cyst may be removed with minor surgery.  Small, inflamed cysts can often be treated with antibiotics or by injecting steroid medicines.  Sometimes, epidermal cysts become large and bothersome. If this happens, surgical removal in your caregiver's office may be necessary. HOME CARE INSTRUCTIONS  Only take over-the-counter or prescription medicines as directed by your caregiver.  Take your antibiotics as directed. Finish them even if you start to feel  better. SEEK MEDICAL CARE IF:   Your cyst becomes tender, red, or swollen.  Your condition is not improving or is getting worse.  You have any other questions or concerns. MAKE SURE YOU:  Understand these instructions.  Will watch your condition.  Will get help right away if you are not doing well or get worse. Document Released: 05/17/2004 Document Revised: 09/08/2011 Document Reviewed: 12/23/2010 [facility] Patient Information 2014 [place], [place]. Vaginitis Vaginitis is an inflammation of the vagina. It is most often caused by a change in the normal balance of the bacteria and yeast that live in the vagina. This change in balance causes an overgrowth of certain bacteria or yeast, which causes the inflammation. There are different types of vaginitis, but the most common types are:  Bacterial vaginosis.  Yeast infection (candidiasis).  Trichomoniasis vaginitis. This is a sexually transmitted infection (STI).  Viral vaginitis.  Atropic vaginitis.  Allergic vaginitis. CAUSES  The cause depends on the type of vaginitis. Vaginitis can be caused by:  Bacteria (bacterial vaginosis).  Yeast (yeast infection).  A parasite (trichomoniasis vaginitis)  A virus (viral vaginitis).  Low hormone levels (atrophic vaginitis). Low hormone levels can occur during pregnancy, breastfeeding, or after menopause.  Irritants, such as bubble baths, scented tampons, and feminine sprays (allergic vaginitis). Other factors can change the normal balance of the yeast and bacteria that live in the vagina. These include:  Antibiotic medicines.  Poor hygiene.  Diaphragms, vaginal sponges, spermicides, birth control pills, and intrauterine devices (IUD).  Sexual intercourse.  Infection.  Uncontrolled diabetes.  A weakened immune system. SYMPTOMS  Symptoms can vary depending on the cause  of the vaginitis. Common symptoms include:  Abnormal vaginal discharge.  The discharge is white, gray,  or yellow with bacterial vaginosis.  The discharge is thick, white, and cheesy with a yeast infection.  The discharge is frothy and yellow or greenish with trichomoniasis.  A bad vaginal odor.  The odor is fishy with bacterial vaginosis.  Vaginal itching, pain, or swelling.  Painful intercourse.  Pain or burning when urinating. Sometimes, there are no symptoms. TREATMENT  Treatment will vary depending on the type of infection.   Bacterial vaginosis and trichomoniasis are often treated with antibiotic creams or pills.  Yeast infections are often treated with antifungal medicines, such as vaginal creams or suppositories.  Viral vaginitis has no cure, but symptoms can be treated with medicines that relieve discomfort. Your sexual partner should be treated as well.  Atrophic vaginitis may be treated with an estrogen cream, pill, suppository, or vaginal ring. If vaginal dryness occurs, lubricants and moisturizing creams may help. You may be told to avoid scented soaps, sprays, or douches.  Allergic vaginitis treatment involves quitting the use of the product that is causing the problem. Vaginal creams can be used to treat the symptoms. HOME CARE INSTRUCTIONS   Take all medicines as directed by your caregiver.  Keep your genital area clean and dry. Avoid soap and only rinse the area with water.  Avoid douching. It can remove the healthy bacteria in the vagina.  Do not use tampons or have sexual intercourse until your vaginitis has been treated. Use sanitary pads while you have vaginitis.  Wipe from front to back. This avoids the spread of bacteria from the rectum to the vagina.  Let air reach your genital area.  Wear cotton underwear to decrease moisture buildup.  Avoid wearing underwear while you sleep until your vaginitis is gone.  Avoid tight pants and underwear or nylons without a cotton panel.  Take off wet clothing (especially bathing suits) as soon as possible.  Use  mild, non-scented products. Avoid using irritants, such as:  Scented feminine sprays.  Fabric softeners.  Scented detergents.  Scented tampons.  Scented soaps or bubble baths.  Practice safe sex and use condoms. Condoms may prevent the spread of trichomoniasis and viral vaginitis. SEEK MEDICAL CARE IF:   You have abdominal pain.  You have a fever or persistent symptoms for more than 2 3 days.  You have a fever and your symptoms suddenly get worse. Document Released: 04/13/2007 Document Revised: 03/10/2012 Document Reviewed: 11/27/2011 [facility] Patient Information 2014 [place].

## 2013-07-20 ENCOUNTER — Telehealth: Payer: Self-pay

## 2013-07-20 NOTE — Telephone Encounter (Signed)
Dr Elder Cyphers please advise.

## 2013-07-20 NOTE — Telephone Encounter (Signed)
PT STATES SHE WAS SEEN BY DR GUEST YESTERDAY AND FORGOT TO ASK FOR THE Hannah [name]. PLEASE CALL PT AT [phone]

## 2013-07-26 ENCOUNTER — Telehealth: Payer: Self-pay

## 2013-07-26 NOTE — Telephone Encounter (Signed)
Patient would like a refill on her estradiol she forgot to ask dr guest when she was here last [phone]

## 2013-07-26 NOTE — Telephone Encounter (Signed)
Patient calling saying it has been over a week since she asked for a refill for her ALPRAZolam Duanne Moron) 0.5 MG tablet She has also contacted her pharmacy [facility] on [address] to send [place] a request. Please advise, patient see's Guest,Daub and Lau[place].  - she is currently scheduling an appt with the appt center I had just transferred her there after we spoke.  Best: [phone]

## 2013-07-27 MED ORDER — ESTRADIOL 1 MG PO TABS: 1.0000 mg | ORAL_TABLET | Freq: Every day | ORAL | Status: DC

## 2013-07-27 MED ORDER — ALPRAZOLAM 0.5 MG PO TABS: 0.5000 mg | ORAL_TABLET | Freq: Every evening | ORAL | Status: DC | PRN

## 2013-07-27 NOTE — Telephone Encounter (Signed)
Pt will make an appt with Dr. Everlene Farrier in the next 30 days. Refill sent

## 2013-07-27 NOTE — Telephone Encounter (Signed)
Refill called in- ok per Dr. Elder Cyphers.

## 2013-07-27 NOTE — Telephone Encounter (Signed)
Ok to rf? 

## 2013-07-27 NOTE — Telephone Encounter (Signed)
Called in script to the pharmacy. Pt advised.

## 2013-08-24 ENCOUNTER — Ambulatory Visit (INDEPENDENT_AMBULATORY_CARE_PROVIDER_SITE_OTHER): Payer: Commercial Managed Care - HMO | Admitting: Emergency Medicine

## 2013-08-24 VITALS — BP 110/60 | HR 73 | Temp 97.9°F | Resp 16 | Ht 59.5 in | Wt 124.0 lb

## 2013-08-24 DIAGNOSIS — N949 Unspecified condition associated with female genital organs and menstrual cycle: Secondary | ICD-10-CM

## 2013-08-24 DIAGNOSIS — N898 Other specified noninflammatory disorders of vagina: Secondary | ICD-10-CM

## 2013-08-24 DIAGNOSIS — B9689 Other specified bacterial agents as the cause of diseases classified elsewhere: Secondary | ICD-10-CM

## 2013-08-24 DIAGNOSIS — A499 Bacterial infection, unspecified: Secondary | ICD-10-CM

## 2013-08-24 DIAGNOSIS — R300 Dysuria: Secondary | ICD-10-CM

## 2013-08-24 DIAGNOSIS — N760 Acute vaginitis: Secondary | ICD-10-CM

## 2013-08-24 LAB — POCT URINALYSIS DIPSTICK
Bilirubin, UA: NEGATIVE
Blood, UA: NEGATIVE
Glucose, UA: NEGATIVE
Ketones, UA: NEGATIVE
Leukocytes, UA: NEGATIVE
Nitrite, UA: NEGATIVE
Protein, UA: NEGATIVE
Spec Grav, UA: <=1.005
Urobilinogen, UA: 0.2
pH, UA: 6

## 2013-08-24 LAB — POCT UA - MICROSCOPIC ONLY
Bacteria, U Microscopic: NEGATIVE
Casts, Ur, LPF, POC: NEGATIVE
Crystals, Ur, HPF, POC: NEGATIVE
Mucus, UA: NEGATIVE
RBC, urine, microscopic: NEGATIVE
Yeast, UA: NEGATIVE

## 2013-08-24 LAB — POCT WET PREP WITH KOH
KOH Prep POC: NEGATIVE
RBC Wet Prep HPF POC: NEGATIVE
Trichomonas, UA: NEGATIVE
Yeast Wet Prep HPF POC: NEGATIVE

## 2013-08-24 MED ORDER — ALBUTEROL SULFATE HFA 108 (90 BASE) MCG/ACT IN AERS: 2.0000 | INHALATION_SPRAY | RESPIRATORY_TRACT | Status: DC | PRN

## 2013-08-24 MED ORDER — PROMETHAZINE-CODEINE 6.25-10 MG/5ML PO SYRP: 5.0000 mL | ORAL_SOLUTION | Freq: Four times a day (QID) | ORAL | Status: DC | PRN

## 2013-08-24 MED ORDER — AZITHROMYCIN 250 MG PO TABS: ORAL_TABLET | ORAL | Status: DC

## 2013-08-24 MED ORDER — FLUTICASONE PROPIONATE 50 MCG/ACT NA SUSP: 2.0000 | Freq: Every day | NASAL | Status: DC

## 2013-08-24 MED ORDER — CLINDAMYCIN HCL 300 MG PO CAPS: 300.0000 mg | ORAL_CAPSULE | Freq: Two times a day (BID) | ORAL | Status: DC

## 2013-08-24 NOTE — Patient Instructions (Addendum)
Bacterial Vaginosis Bacterial vaginosis is a vaginal infection that occurs when the normal balance of bacteria in the vagina is disrupted. It results from an overgrowth of certain bacteria. This is the most common vaginal infection in women of childbearing age. Treatment is important to prevent complications, especially in pregnant women, as it can cause a premature delivery. CAUSES  Bacterial vaginosis is caused by an increase in harmful bacteria that are normally present in smaller amounts in the vagina. Several different kinds of bacteria can cause bacterial vaginosis. However, the reason that the condition develops is not fully understood. RISK FACTORS Certain activities or behaviors can put you at an increased risk of developing bacterial vaginosis, including:  Having a new sex partner or multiple sex partners.  Douching.  Using an intrauterine device (IUD) for contraception. Women do not get bacterial vaginosis from toilet seats, bedding, swimming pools, or contact with objects around them. SIGNS AND SYMPTOMS  Some women with bacterial vaginosis have no signs or symptoms. Common symptoms include:  Grey vaginal discharge.  A fishlike odor with discharge, especially after sexual intercourse.  Itching or burning of the vagina and vulva.  Burning or pain with urination. DIAGNOSIS  Your health care provider will take a medical history and examine the vagina for signs of bacterial vaginosis. A sample of vaginal fluid may be taken. Your health care provider will look at this sample under a microscope to check for bacteria and abnormal cells. A vaginal pH test may also be done.  TREATMENT  Bacterial vaginosis may be treated with antibiotic medicines. These may be given in the form of a pill or a vaginal cream. A second round of antibiotics may be prescribed if the condition comes back after treatment.  HOME CARE INSTRUCTIONS   Only take over-the-counter or prescription medicines as  directed by your health care provider.  If antibiotic medicine was prescribed, take it as directed. Make sure you finish it even if you start to feel better.  Do not have sex until treatment is completed.  Tell all sexual partners that you have a vaginal infection. They should see their health care provider and be treated if they have problems, such as a mild rash or itching.  Practice safe sex by using condoms and only having one sex partner. SEEK MEDICAL CARE IF:   Your symptoms are not improving after 3 days of treatment.  You have increased discharge or pain.  You have a fever. MAKE SURE YOU:   Understand these instructions.  Will watch your condition.  Will get help right away if you are not doing well or get worse. FOR MORE INFORMATION  Centers for Disease Control and Prevention, Division of STD Prevention: Appraiser[employer]raud[employer]fi American Sexual Health Association (ASHA): www.ashastd.org  Document Released: 06/16/2005 Document Revised: 04/06/2013 Document Reviewed: 01/26/2013 [facility] Patient Information 2014 [place]. Metered Dose Inhaler (No Spacer Used) Inhaled medicines are the basis of asthma treatment and other breathing problems. Inhaled medicine can only be effective if used properly. Good technique assures that the medicine reaches the lungs. Metered dose inhalers (MDIs) are used to deliver a variety of inhaled medicines. These include quick relief or rescue medicines (such as bronchodilators) and controller medicines (such as corticosteroids). The medicine is delivered by pushing down on a metal canister to release a set amount of spray. If you are using different kinds of inhalers, use your quick relief medicine to open the airways 10 15 minutes before using a steroid if instructed to do so by your  health care provider. If you are unsure which inhalers to use and the order of using them, ask your health care provider, nurse, or respiratory therapist. HOW TO USE THE  INHALER 1. Remove cap from inhaler. 2. If you are using the inhaler for the first time, you will need to prime it. Shake the inhaler for 5 seconds and release four puffs into the air, away from your face. Ask your health care provider or pharmacist if you have questions about priming your inhaler. 3. Shake inhaler for 5 seconds before each breath in (inhalation). 4. Position the inhaler so that the top of the canister faces up. 5. Put your index finger on the top of the medicine canister. Your thumb supports the bottom of the inhaler. 6. Open your mouth. 7. Either place the inhaler between your teeth and place your lips tightly around the mouthpiece, or hold the inhaler 1 2 inches away from your open mouth. If you are unsure of which technique to use, ask your health care provider. 8. Breathe out (exhale) normally and as completely as possible. 9. Press the canister down with the index finger to release the medicine. 10. At the same time as the canister is pressed, inhale deeply and slowly until the lungs are completely filled. This should take 4 6 seconds. Keep your tongue down. 11. Hold the medicine in your lungs for up to 5 10 seconds (10 seconds is best). This helps the medicine get into the small airways of your lungs. 12. Breathe out slowly, through pursed lips. Whistling is an example of pursed lips. 13. Wait at least 1 minute between puffs. Continue with the above steps until you have taken the number of puffs your health care provider has ordered. Do not use the inhaler more than your health care provider directs you to. 14. Replace cap on inhaler. 15. Follow the directions from your health care provider or the inhaler insert for cleaning the inhaler. If you are using a steroid inhaler, rinse your mouth with water after your last puff, gargle, and spit out the water. Do not swallow the water. AVOID:  Inhaling before or after starting the spray of medicine. It takes practice to coordinate  your breathing with triggering the spray.  Inhaling through the nose (rather than the mouth) when triggering the spray. HOW TO DETERMINE IF YOUR INHALER IS FULL OR NEARLY EMPTY You cannot know when an inhaler is empty by shaking it. A few inhalers are now being made with dose counters. Ask your health care provider for a prescription that has a dose counter if you feel you need that extra help. If your inhaler does not have a counter, ask your health care provider to help you determine the date you need to refill your inhaler. Write the refill date on a calendar or your inhaler canister. Refill your inhaler 7 10 days before it runs out. Be sure to keep an adequate supply of medicine. This includes making sure it is not expired, and you have a spare inhaler.  SEEK MEDICAL CARE IF:   Symptoms are only partially relieved with your inhaler.  You are having trouble using your inhaler.  You experience some increase in phlegm. SEEK IMMEDIATE MEDICAL CARE IF:   You feel little or no relief with your inhalers. You are still wheezing and are feeling shortness of breath or tightness in your chest or both.  You have dizziness, headaches, or fast heart rate.  You have chills, fever, or night sweats.  There is a noticeable increase in phlegm production, or there is blood in the phlegm. Document Released: 04/13/2007 Document Revised: 02/16/2013 Document Reviewed: 12/02/2012 [facility] Patient Information 2014 [place], [place].

## 2013-08-24 NOTE — Progress Notes (Signed)
[facility] [address] [place] [address] [phone]  Date:  08/24/2013   Name:  Hannah [name]   DOB:  12-18-1950   MRN:  [id number]  PCP:  Vic Blackbird, MD    Chief Complaint: Cyst   History of Present Illness:  Hannah [name] is a 63 y.o. very pleasant female patient who presents with the following:  Patient is concerned that she has two "cysts" on her perineum these were treated with doxy last month and are unchanged.  They are not painful or tender.  She is concerned by there mere presence.  After examination of the masses was concluded she expressed concerns about a discharge and said she could not have an odor.  Treated last month for BV She offered that she has dysuria with no frequency or urgency.  No fever or chills.   Has nasal congestion and mucoid drainage.  Post nasal mucoid drainage.  No sore throat.  Has a cough productive some purulent sputum.  With wheezing.  Never treated for wheezing or asthma previously.   No improvement with over the counter medications or other home remedies. Denies other complaint or health concern today.   Patient Active Problem List   Diagnosis Date Noted  . High risk HPV infection 12/16/2012  . Chronic constipation 12/16/2012  . Arthritis 10/08/2012  . Unspecified episodic mood disorder 10/08/2012  . Nicotine abuse 12/29/2011    Past Medical History  Diagnosis Date  . Anxiety   . Depression   . Arthritis   . Hypertension   . Allergy   . Heart murmur     Past Surgical History  Procedure Laterality Date  . Spine surgery    . Abdominal hysterectomy    . Tubal ligation      History  Substance Use Topics  . Smoking status: Current Every Day Smoker -- 0.50 packs/day for 40 years    Types: Cigarettes  . Smokeless tobacco: Never Used  . Alcohol Use: No    Family History  Problem Relation Age of Onset  . Cancer Father   . Diabetes Sister   . Stroke Maternal Grandfather     Allergies   Allergen Reactions  . Amlodipine   . Chantix [Varenicline Tartrate] Other (See Comments)    Tongue swell,sob  . Clarithromycin Rash  . Lisinopril Hives  . Simvastatin Hives  . Wellbutrin [Bupropion Hcl] Hives    Medication list has been reviewed and updated.  Current Outpatient Prescriptions on File Prior to Visit  Medication Sig Dispense Refill  . ALPRAZolam (XANAX) 0.5 MG tablet Take 1 tablet (0.5 mg total) by mouth at bedtime as needed for sleep.  30 tablet  0  . bisoprolol-hydrochlorothiazide (ZIAC) 5-6.25 MG per tablet Take 1 tablet by mouth daily.  90 tablet  3  . cholecalciferol (VITAMIN D) 1000 UNITS tablet Take 1,000 Units by mouth daily.      Marland Kitchen estradiol (ESTRACE) 1 MG tablet Take 1 tablet (1 mg total) by mouth daily.  30 tablet  0  . HYDROcodone-acetaminophen (NORCO) 5-325 MG per tablet Take 1 tablet by mouth every 6 (six) hours as needed for pain.  30 tablet  0  . lamoTRIgine (LAMICTAL) 150 MG tablet Take 150 mg by mouth daily.      Marland Kitchen omeprazole (PRILOSEC) 40 MG capsule Take 40 mg by mouth daily.      . cyclobenzaprine (FLEXERIL) 5 MG tablet Take 1 tablet (5 mg total) by mouth 3 (  three) times daily as needed for muscle spasms.  30 tablet  1  . divalproex (DEPAKOTE) 500 MG DR tablet Take 500 mg by mouth 3 (three) times daily.      Marland Kitchen doxycycline (VIBRA-TABS) 100 MG tablet Take 1 tablet (100 mg total) by mouth 2 (two) times daily.  20 tablet  0  . meloxicam (MOBIC) 15 MG tablet Take 15 mg by mouth daily.      . metroNIDAZOLE (METROGEL VAGINAL) 0.75 % vaginal gel Place 1 Applicatorful vaginally 2 (two) times daily.  70 g  0  . PARoxetine (PAXIL) 10 MG tablet Take 1 tablet (10 mg total) by mouth every morning.  30 tablet  11  . predniSONE (DELTASONE) 20 MG tablet Take 1 tablet (20 mg total) by mouth 3 (three) times daily.  6 tablet  0   No current facility-administered medications on file prior to visit.    Review of Systems:  As per HPI, otherwise negative.    Physical  Examination: Filed Vitals:   08/24/13 1335  BP: 110/60  Pulse: 73  Temp: 97.9 F (36.6 C)  Resp: 16   Filed Vitals:   08/24/13 1335  Height: 4' 11.5" (1.511 m)  Weight: 124 lb (56.246 kg)   Body mass index is 24.64 kg/(m^2). Ideal Body Weight: Weight in (lb) to have BMI = 25: 125.6  GEN: WDWN, NAD, Non-toxic, A & O x 3 HEENT: Atraumatic, Normocephalic. Neck supple. No masses, No LAD. Ears and Nose: No external deformity. CV: RRR, No M/G/R. No JVD. No thrill. No extra heart sounds. PULM: CTA B, no wheezes, crackles, rhonchi. No retractions. No resp. distress. No accessory muscle use. ABD: S, NT, ND, +BS. No rebound. No HSM. EXTR: No c/c/e NEURO Normal gait.  PSYCH: Normally interactive. Conversant. Not depressed or anxious appearing.  Calm demeanor.    Assessment and Plan: Seasonal allergic rhinitis flonase Bronchitis zpak Phen c cod   Signed,  Ellison Carwin, MD   Results for orders placed in visit on 08/24/13  POCT WET PREP WITH KOH      Result Value Ref Range   Trichomonas, UA Negative     Clue Cells Wet Prep HPF POC 5-10     Epithelial Wet Prep HPF POC 10-15     Yeast Wet Prep HPF POC neg     Bacteria Wet Prep HPF POC 3+     RBC Wet Prep HPF POC neg     WBC Wet Prep HPF POC 0-1     KOH Prep POC Negative    POCT URINALYSIS DIPSTICK      Result Value Ref Range   Color, UA light yellow     Clarity, UA clear     Glucose, UA neg     Bilirubin, UA neg     Ketones, UA neg     Spec Grav, UA <=1.005     Blood, UA neg     pH, UA 6.0     Protein, UA neg     Urobilinogen, UA 0.2     Nitrite, UA neg     Leukocytes, UA Negative    POCT UA - MICROSCOPIC ONLY      Result Value Ref Range   WBC, Ur, HPF, POC 0-1     RBC, urine, microscopic neg     Bacteria, U Microscopic neg     Mucus, UA neg     Epithelial cells, urine per micros 0-4     Crystals, Ur, HPF, POC neg  Casts, Ur, LPF, POC neg     Yeast, UA neg

## 2013-08-26 ENCOUNTER — Telehealth: Payer: Self-pay

## 2013-08-26 DIAGNOSIS — L709 Acne, unspecified: Secondary | ICD-10-CM

## 2013-08-26 NOTE — Telephone Encounter (Signed)
Orders Placed This Encounter  Procedures  . Ambulatory referral to Dermatology    Referral Priority:  Routine    Referral Type:  Consultation    Referral Reason:  Specialty Services Required    Referred to Provider:  Simona Huh, MD    Requested Specialty:  Dermatology    Number of Visits Requested:  1

## 2013-08-26 NOTE — Telephone Encounter (Signed)
Pt requests referral for [facility]ology for her acne. She is an established pt at his office, but her [facility] insurance requires a referral.  Pt best: [phone] or [phone]  bf

## 2013-08-28 NOTE — Telephone Encounter (Signed)
Pt.notified

## 2013-09-05 ENCOUNTER — Other Ambulatory Visit: Payer: Self-pay | Admitting: Internal Medicine

## 2013-10-10 ENCOUNTER — Telehealth: Payer: Self-pay

## 2013-10-10 NOTE — Telephone Encounter (Signed)
Lm pt needs to RTC for referral to ortho.

## 2013-10-10 NOTE — Telephone Encounter (Signed)
Pt is needing an orthopedic referral for bilateral knee pain

## 2013-10-11 ENCOUNTER — Other Ambulatory Visit: Payer: Self-pay | Admitting: Family Medicine

## 2013-10-13 ENCOUNTER — Other Ambulatory Visit: Payer: Self-pay | Admitting: Internal Medicine

## 2013-10-15 ENCOUNTER — Ambulatory Visit (INDEPENDENT_AMBULATORY_CARE_PROVIDER_SITE_OTHER): Payer: Commercial Managed Care - HMO | Admitting: Emergency Medicine

## 2013-10-15 VITALS — BP 130/80 | HR 61 | Temp 98.5°F | Resp 16 | Ht 60.0 in | Wt 128.4 lb

## 2013-10-15 DIAGNOSIS — M25561 Pain in right knee: Secondary | ICD-10-CM

## 2013-10-15 DIAGNOSIS — Z1231 Encounter for screening mammogram for malignant neoplasm of breast: Secondary | ICD-10-CM

## 2013-10-15 DIAGNOSIS — B977 Papillomavirus as the cause of diseases classified elsewhere: Secondary | ICD-10-CM

## 2013-10-15 DIAGNOSIS — E781 Pure hyperglyceridemia: Secondary | ICD-10-CM

## 2013-10-15 DIAGNOSIS — F411 Generalized anxiety disorder: Secondary | ICD-10-CM

## 2013-10-15 DIAGNOSIS — I6529 Occlusion and stenosis of unspecified carotid artery: Secondary | ICD-10-CM

## 2013-10-15 DIAGNOSIS — R6889 Other general symptoms and signs: Secondary | ICD-10-CM

## 2013-10-15 DIAGNOSIS — F419 Anxiety disorder, unspecified: Secondary | ICD-10-CM

## 2013-10-15 DIAGNOSIS — M25569 Pain in unspecified knee: Secondary | ICD-10-CM

## 2013-10-15 DIAGNOSIS — I10 Essential (primary) hypertension: Secondary | ICD-10-CM

## 2013-10-15 DIAGNOSIS — R011 Cardiac murmur, unspecified: Secondary | ICD-10-CM

## 2013-10-15 DIAGNOSIS — M25562 Pain in left knee: Secondary | ICD-10-CM

## 2013-10-15 LAB — LIPID PANEL
Cholesterol: 300 mg/dL — ABNORMAL HIGH (ref 0–200)
HDL: 29 mg/dL — ABNORMAL LOW (ref >39)
Total CHOL/HDL Ratio: 10.3 Ratio
Triglycerides: 828 mg/dL — ABNORMAL HIGH (ref <150)

## 2013-10-15 LAB — COMPREHENSIVE METABOLIC PANEL
ALT: 19 U/L (ref 0–35)
AST: 25 U/L (ref 0–37)
Albumin: 4.2 g/dL (ref 3.5–5.2)
Alkaline Phosphatase: 62 U/L (ref 39–117)
BUN: 6 mg/dL (ref 6–23)
CO2: 28 mEq/L (ref 19–32)
Calcium: 9.8 mg/dL (ref 8.4–10.5)
Chloride: 103 mEq/L (ref 96–112)
Creat: 0.69 mg/dL (ref 0.50–1.10)
Glucose, Bld: 98 mg/dL (ref 70–99)
Potassium: 4.1 mEq/L (ref 3.5–5.3)
Sodium: 140 mEq/L (ref 135–145)
Total Bilirubin: 0.3 mg/dL (ref 0.2–1.2)
Total Protein: 6.6 g/dL (ref 6.0–8.3)

## 2013-10-15 LAB — POCT CBC
Granulocyte percent: 52.5 %G (ref 37–80)
HCT, POC: 36.8 % — AB (ref 37.7–47.9)
Hemoglobin: 11.7 g/dL — AB (ref 12.2–16.2)
Lymph, poc: 2.5 (ref 0.6–3.4)
MCH, POC: 27.1 pg (ref 27–31.2)
MCHC: 31.8 g/dL (ref 31.8–35.4)
MCV: 85.3 fL (ref 80–97)
MID (cbc): 0.5 (ref 0–0.9)
MPV: 8 fL (ref 0–99.8)
POC Granulocyte: 3.3 (ref 2–6.9)
POC LYMPH PERCENT: 39.6 %L (ref 10–50)
POC MID %: 7.9 %M (ref 0–12)
Platelet Count, POC: 293 10*3/uL (ref 142–424)
RBC: 4.32 M/uL (ref 4.04–5.48)
RDW, POC: 13.9 %
WBC: 6.2 10*3/uL (ref 4.6–10.2)

## 2013-10-15 LAB — T4, FREE: Free T4: 0.93 ng/dL (ref 0.80–1.80)

## 2013-10-15 LAB — TSH: TSH: 0.923 u[IU]/mL (ref 0.350–4.500)

## 2013-10-15 MED ORDER — BISOPROLOL-HYDROCHLOROTHIAZIDE 5-6.25 MG PO TABS: 1.0000 | ORAL_TABLET | Freq: Every day | ORAL | Status: DC

## 2013-10-15 NOTE — Telephone Encounter (Signed)
Pt seen in office today.

## 2013-10-15 NOTE — Patient Instructions (Signed)
He had multiple appointments in the process of being made. As we discussed you cannot take your hormone tablet 2 I would encourage you to stop smoking please make an appointment at 104 for a physical to

## 2013-10-15 NOTE — Telephone Encounter (Signed)
Patient needs estradiol (ESTRACE) 1 MG tablet  until she can come in on Monday   [facility] - [address]   [phone]

## 2013-10-15 NOTE — Progress Notes (Signed)
Subjective:    Patient ID: Hannah [name], female    DOB: 1951-05-10, 63 y.o.   MRN: [id number]  HPI  Chief Complaint  Patient presents with  . medicaiton refills    estriadol and ziac 90 day supply  . referrals    orthopedic specialist, gynecology   . pt feels cold at all times-concerned    This chart was scribed for Hannah Queen, MD by Thea Alken, ED Scribe. This patient was seen in room 4 and the patient's care was started at 11:46 AM.  HPI Comments: Hannah [name] is a 63 y.o. female who presents to the [facility] requesting a referral  to Dr. Eddie Dibbles the orthopedist for bilateral knee pain. Pt reports that she is looking an Materials engineer and is requesting for Dr.Dove. Pt reports that she has been take xanax and estradiol. Pt states that she has ran out of estradiol and is requesting more. She reports h/o of hysterectomy and that she has been on estradiol since 1994. She report taking it daily. Pt reports last mammogram 1 year ago. Otherwise pt reports that she has been doing well.   Pt is also complaining of constant coldness in her bilateral hands, feet and nose.    Past Medical History  Diagnosis Date  . Anxiety   . Depression   . Arthritis   . Hypertension   . Allergy   . Heart murmur    Allergies  Allergen Reactions  . Amlodipine   . Chantix [Varenicline Tartrate] Other (See Comments)    Tongue swell,sob  . Clarithromycin Rash  . Lisinopril Hives  . Simvastatin Hives  . Wellbutrin [Bupropion Hcl] Hives   Prior to Admission medications   Medication Sig Start Date End Date Taking? Authorizing Provider  albuterol (PROVENTIL HFA;VENTOLIN HFA) 108 (90 BASE) MCG/ACT inhaler Inhale 2 puffs into the lungs every 4 (four) hours as needed for wheezing or shortness of breath (cough, shortness of breath or wheezing.). 08/24/13   Ellison Carwin, MD  ALPRAZolam Duanne Moron) 0.5 MG tablet Take 1 tablet (0.5 mg total) by mouth at bedtime as needed for sleep. 07/27/13    Orma Flaming, MD  azithromycin (ZITHROMAX) 250 MG tablet Take 2 tabs PO x 1 dose, then 1 tab PO QD x 4 days 08/24/13   Ellison Carwin, MD  bisoprolol-hydrochlorothiazide [facility]) 5-6.25 MG per tablet Take 1 tablet by mouth daily. 10/08/12 10/08/13  Darlyne Russian, MD  cholecalciferol (VITAMIN D) 1000 UNITS tablet Take 1,000 Units by mouth daily.    Historical Provider, MD  clindamycin (CLEOCIN) 300 MG capsule Take 1 capsule (300 mg total) by mouth 2 (two) times daily. 08/24/13   Ellison Carwin, MD  cyclobenzaprine (FLEXERIL) 5 MG tablet Take 1 tablet (5 mg total) by mouth 3 (three) times daily as needed for muscle spasms. 02/09/13   Robyn Haber, MD  divalproex (DEPAKOTE) 500 MG DR tablet Take 500 mg by mouth 3 (three) times daily.    Historical Provider, MD  doxycycline (VIBRA-TABS) 100 MG tablet Take 1 tablet (100 mg total) by mouth 2 (two) times daily. 07/19/13   Orma Flaming, MD  estradiol (ESTRACE) 1 MG tablet TAKE ONE TABLET BY MOUTH ONCE DAILY--PT NEEDS OFFICE VISIT FOR ADDITIONAL REFILLS    Ryan M Dunn, PA-C  fluticasone (FLONASE) 50 MCG/ACT nasal spray Place 2 sprays into both nostrils daily. 08/24/13   Ellison Carwin, MD  HYDROcodone-acetaminophen (NORCO) 5-325 MG per tablet Take 1 tablet by mouth every 6 (  six) hours as needed for pain. 10/08/12   Darlyne Russian, MD  lamoTRIgine (LAMICTAL) 150 MG tablet Take 150 mg by mouth daily.    Historical Provider, MD  meloxicam (MOBIC) 15 MG tablet Take 15 mg by mouth daily.    Historical Provider, MD  metroNIDAZOLE (METROGEL VAGINAL) 0.75 % vaginal gel Place 1 Applicatorful vaginally 2 (two) times daily. 07/19/13   Orma Flaming, MD  omeprazole (PRILOSEC) 40 MG capsule Take 40 mg by mouth daily.    Historical Provider, MD  PARoxetine (PAXIL) 10 MG tablet Take 1 tablet (10 mg total) by mouth every morning. 12/31/12   Darlyne Russian, MD  predniSONE (DELTASONE) 20 MG tablet Take 1 tablet (20 mg total) by mouth 3 (three) times daily. 02/09/13   Robyn Haber, MD  promethazine-codeine [facility] WITH CODEINE) 6.25-10 MG/5ML syrup Take 5-10 mLs by mouth every 6 (six) hours as needed. 08/24/13   Ellison Carwin, MD   Review of Systems  Endocrine: Positive for cold intolerance.      Objective:   Physical Exam CONSTITUTIONAL: Well developed/well nourished HEAD: Normocephalic/atraumatic EYES: EOMI/PERRL ENMT: Mucous membranes moist NECK: supple no meningeal signs SPINE:entire spine nontender CV: S1/S2 noted, no murmurs/rubs/gallops noted there is a 2/6 systolic murmur at the base of the heart. LUNGS: Lungs are clear to auscultation bilaterally, no apparent distress ABDOMEN: soft, nontender, no rebound or guarding GU:no cva tenderness NEURO: Pt is awake/alert, moves all extremitiesx4 EXTREMITIES: pulses normal, full ROM SKIN: warm, color normal PSYCH: no abnormalities of mood noted   EKG right bundle branch block unchanged from previous with signs of LVH Assessment & Plan:  I told the patient she could not be on hormone replacement. She is a smoker and has known carotid disease by ultrasound. She has not followed up with the cardiologist or had a followup carotid study. Followup will be made at [facility] for reevaluation of this and repeat carotid studies. She was also referred to orthopedist for her knees in today and she went specialist she went checkup for known HPV. Mammogram was also ordered  I personally performed the services described in this documentation, which was scribed in my presence. The recorded information has been reviewed and is accurate.

## 2013-10-17 NOTE — Progress Notes (Signed)
Left a message for patient to return call to schedule an appointment.

## 2013-10-19 ENCOUNTER — Telehealth: Payer: Self-pay | Admitting: Family Medicine

## 2013-10-19 NOTE — Progress Notes (Signed)
Spoke with patient and she is going to come into [address] to see Dr. Everlene Farrier instead of waiting for appointment in June

## 2013-10-19 NOTE — Telephone Encounter (Signed)
Home number was busy x 2.  Left message on machine to call back on cell

## 2013-10-19 NOTE — Telephone Encounter (Signed)
The record says she is allergic to statins and so I did not put her on medication. It would make more sense to make an appointment with one of the other providers at [facility] because I will be out of the [place] for the next 3 weeks. If she wants to see me after she sees a cardiologist I would be happy to do a physical on her but it will be 6-8 weeks from now

## 2013-10-19 NOTE — Telephone Encounter (Signed)
Patient called concerning cholesterol.  She would like to know if she should be on medicine for this issue?  She would like to know if it is ok to schedule a CPE with you after cardiology appointment.  She stated that she is still feeling bad and I told her she is welcome to come to the walk in clinic.  Also gave her your hours.  Please advise.

## 2013-10-20 ENCOUNTER — Telehealth: Payer: Self-pay

## 2013-10-20 NOTE — Telephone Encounter (Signed)
Pt notified. Will call back for appt.  Unable to see any notes on any of the referrals we did on 10/15/13. We did 3. Can you please check on the status of these please. Thanks

## 2013-10-20 NOTE — Telephone Encounter (Signed)
Patient called stated she need medication Fenofibrate, Stated Dr. Jacelyn Grip prescribe medication but he is at another clinic. [phone]

## 2013-10-21 ENCOUNTER — Telehealth: Payer: Self-pay

## 2013-10-21 NOTE — Telephone Encounter (Signed)
Spoke to [place] regarding med refill.  After reviewing chart, Fenofibrate not on patient's current medlist.  Called patient advised to contact Dr. Jodi Mourning office for refill.  Patient said she would do so.

## 2013-10-21 NOTE — Telephone Encounter (Signed)
Spoke to patient's daughter.  She said her mother had been called with lab results.  She did not understand the results.  I reviewed patient's chart and advised that cholestrol was over 300 and that Dr. Everlene Farrier wanted patient to see the cardiologist to discuss this.  Assured her that a copy of the paperwork had been sent over and that referral had been made.  They are just waiting for a call with the appointment time.  She was very Patent attorney.

## 2013-10-21 NOTE — Telephone Encounter (Signed)
Daughter is very pleasant.  She requesting information so she can provide adequate care for her mom.   [phone]

## 2013-10-24 ENCOUNTER — Ambulatory Visit: Payer: Commercial Managed Care - HMO | Admitting: Family Medicine

## 2013-11-01 ENCOUNTER — Telehealth: Payer: Self-pay

## 2013-11-01 NOTE — Telephone Encounter (Signed)
Patient called me back. Since starting clindomyacin states she is having diarrhea and getting bumps on her tongue.  She only took the medication one time and discontinued it because of the side effects.  She is requesting a new antibiotic.

## 2013-11-01 NOTE — Telephone Encounter (Signed)
The clindamycin was prescribed to her in February.  Why is she taking this now?  If she having symptoms that she feels warrant an antibiotic, she should RTC

## 2013-11-01 NOTE — Telephone Encounter (Signed)
PT STATES THE CLEOCIN SHE WAS GIVEN IS MAKING HER SICK, SHE CAN'T TAKE IT. PLEASE CALL PT AT [place]

## 2013-11-01 NOTE — Telephone Encounter (Signed)
LMVM to CB. 

## 2013-11-02 NOTE — Telephone Encounter (Signed)
Patient states she is feeling better today.  She seemed hesitant to RTC.  Told her if her symptoms return to RTC.

## 2013-11-09 ENCOUNTER — Institutional Professional Consult (permissible substitution): Payer: Self-pay | Admitting: Cardiology

## 2013-11-10 ENCOUNTER — Ambulatory Visit (INDEPENDENT_AMBULATORY_CARE_PROVIDER_SITE_OTHER): Payer: Commercial Managed Care - HMO | Admitting: Cardiology

## 2013-11-10 ENCOUNTER — Encounter: Payer: Self-pay | Admitting: Cardiology

## 2013-11-10 VITALS — BP 130/66 | HR 58 | Ht 62.5 in | Wt 125.1 lb

## 2013-11-10 DIAGNOSIS — E7800 Pure hypercholesterolemia, unspecified: Secondary | ICD-10-CM

## 2013-11-10 DIAGNOSIS — R011 Cardiac murmur, unspecified: Secondary | ICD-10-CM

## 2013-11-10 DIAGNOSIS — I6529 Occlusion and stenosis of unspecified carotid artery: Secondary | ICD-10-CM

## 2013-11-10 NOTE — Patient Instructions (Signed)
The current medical regimen is effective;  continue present plan and medications.  Your physician has requested that you have a carotid duplex. This test is an ultrasound of the carotid arteries in your neck. It looks at blood flow through these arteries that supply the brain with blood. Allow one hour for this exam. There are no restrictions or special instructions.  Your physician has requested that you have an echocardiogram. Echocardiography is a painless test that uses sound waves to create images of your heart. It provides your doctor with information about the size and shape of your heart and how well your heart's chambers and valves are working. This procedure takes approximately one hour. There are no restrictions for this procedure.  You have been referred to the [facility].  Follow up with Dr Percival Spanish in 3 months.

## 2013-11-10 NOTE — Progress Notes (Signed)
HPI The patient presents for evaluation of multiple cardiovascular risk factors plus the history apparently of carotid stenosis plus a murmur. She reports having seen another cardiology group some years ago but doesn't recall the workup. It is mentioned that she might have some carotid stenosis but I don't have this result. I do have a stress echocardiogram from 2008 that demonstrated no evidence of wall motion abnormality. She reports that she does well but she does smoke. She has a very elevated triglyceride and total cholesterol. She is somewhat limited in activities because of knee pain and she might have surgery in the future. She does get around to do such things as vacuuming.  The patient denies any new symptoms such as chest discomfort, neck or arm discomfort. There has been no new shortness of breath, PND or orthopnea. There have been no reported palpitations, presyncope or syncope.  Allergies  Allergen Reactions  . Amlodipine   . Chantix [Varenicline Tartrate] Other (See Comments)    Tongue swell,sob  . Clarithromycin Rash  . Lisinopril Hives  . Simvastatin Hives  . Wellbutrin [Bupropion Hcl] Hives    Current Outpatient Prescriptions  Medication Sig Dispense Refill  . ALPRAZolam (XANAX) 0.5 MG tablet Take 1 tablet (0.5 mg total) by mouth at bedtime as needed for sleep.  30 tablet  0  . aspirin 81 MG tablet Take 81 mg by mouth daily.      . bisoprolol-hydrochlorothiazide (ZIAC) 5-6.25 MG per tablet Take 1 tablet by mouth daily.  90 tablet  3  . cholecalciferol (VITAMIN D) 1000 UNITS tablet Take 1,000 Units by mouth daily.      Marland Kitchen estradiol (ESTRACE) 1 MG tablet TAKE ONE TABLET BY MOUTH ONCE DAILY--PT NEEDS OFFICE VISIT FOR ADDITIONAL REFILLS  30 tablet  11  . Guaifenesin (MUCINEX MAXIMUM STRENGTH) 1200 MG TB12 Take 1,200 mg by mouth every 12 (twelve) hours.      Marland Kitchen HYDROcodone-acetaminophen (NORCO) 5-325 MG per tablet Take 1 tablet by mouth every 6 (six) hours as needed for pain.  30  tablet  0  . omeprazole (PRILOSEC) 40 MG capsule Take 40 mg by mouth daily.       No current facility-administered medications for this visit.    Past Medical History  Diagnosis Date  . Anxiety   . Depression   . Arthritis   . Hypertension   . Allergy   . Heart murmur     Past Surgical History  Procedure Laterality Date  . Spine surgery    . Abdominal hysterectomy    . Tubal ligation      Family History  Problem Relation Age of Onset  . Cancer Father   . Diabetes Sister   . Stroke Maternal Grandfather     History   Social History  . Marital Status: Divorced    Spouse Name: N/A    Number of Children: N/A  . Years of Education: N/A   Occupational History  . Not on file.   Social History Main Topics  . Smoking status: Current Every Day Smoker -- 0.50 packs/day for 40 years    Types: Cigarettes  . Smokeless tobacco: Never Used  . Alcohol Use: No  . Drug Use: No  . Sexual Activity: No   Other Topics Concern  . Not on file   Social History Narrative  . No narrative on file    ROS:  Positive for palpitations, reflux, difficulty swallowing, any pain. Otherwise as stated in the HPI and negative for  all other systems.  PHYSICAL EXAM BP 130/66  Pulse 58  Ht 5' 2.5" (1.588 m)  Wt 125 lb 1.9 oz (56.754 kg)  BMI 22.51 kg/m2 GENERAL:  Well appearing HEENT:  Pupils equal round and reactive, fundi not visualized, oral mucosa unremarkable NECK:  No jugular venous distention, waveform within normal limits, carotid upstroke brisk and symmetric, soft right systolic bruit versus transmitted murmur no thyromegaly LYMPHATICS:  No cervical, inguinal adenopathy LUNGS:  Clear to auscultation bilaterally BACK:  No CVA tenderness CHEST:  Unremarkable HEART:  PMI not displaced or sustained,S1 and S2 within normal limits, no S3, no S4, no clicks, no rubs, 2/6 apical systolic murmur radiating slightly out the aortic outflow tract murmurs ABD:  Flat, positive bowel sounds  normal in frequency in pitch, no bruits, no rebound, no guarding, no midline pulsatile mass, no hepatomegaly, no splenomegaly EXT:  2 plus pulses throughout, no edema, no cyanosis no clubbing SKIN:  No rashes no nodules NEURO:  Cranial nerves II through XII grossly intact, motor grossly intact throughout PSYCH:  Cognitively intact, oriented to person place and time   EKG:  Sinus rhythm, rate 57, right bundle branch block, left anterior fascicular block, no acute ST-T wave changes.  ASSESSMENT AND PLAN  MURMUR:  This is consistent with mild aortic stenosis/sclerosis. She will get an echocardiogram.  RBBB:  We discussed this.  CAROTID STENOSIS:  She will have carotid Dopplers to followup on this previous diagnosis.  DYSLIPIDEMIA:  She will need to be seen in the lipid clinic.  TOBACCO:  She does not want to quit smoking.    RISK REDUCTION:  I will consider routine screening and she can do an exercise treadmill test in the future given her multiple risk factors.

## 2013-11-21 ENCOUNTER — Telehealth: Payer: Self-pay | Admitting: Emergency Medicine

## 2013-11-21 NOTE — Telephone Encounter (Signed)
Patient got a VM on Friday instructing her to call back and ask for the clinical TL. Patient is not sure what this is about since she did not call the office recently with any concerns.   [phone]

## 2013-11-23 ENCOUNTER — Encounter: Payer: Self-pay | Admitting: *Deleted

## 2013-11-23 DIAGNOSIS — M1990 Unspecified osteoarthritis, unspecified site: Secondary | ICD-10-CM | POA: Insufficient documentation

## 2013-11-23 DIAGNOSIS — G5603 Carpal tunnel syndrome, bilateral upper limbs: Secondary | ICD-10-CM | POA: Insufficient documentation

## 2013-11-29 ENCOUNTER — Ambulatory Visit: Payer: 59 | Admitting: Pharmacist

## 2013-11-29 ENCOUNTER — Ambulatory Visit (HOSPITAL_BASED_OUTPATIENT_CLINIC_OR_DEPARTMENT_OTHER): Payer: Medicare HMO | Admitting: Cardiology

## 2013-11-29 ENCOUNTER — Ambulatory Visit (INDEPENDENT_AMBULATORY_CARE_PROVIDER_SITE_OTHER): Payer: Commercial Managed Care - HMO | Admitting: Pharmacist

## 2013-11-29 ENCOUNTER — Ambulatory Visit (HOSPITAL_COMMUNITY): Payer: Medicare HMO | Attending: Cardiology | Admitting: Cardiology

## 2013-11-29 VITALS — Wt 128.0 lb

## 2013-11-29 DIAGNOSIS — R011 Cardiac murmur, unspecified: Secondary | ICD-10-CM

## 2013-11-29 DIAGNOSIS — E1169 Type 2 diabetes mellitus with other specified complication: Secondary | ICD-10-CM | POA: Insufficient documentation

## 2013-11-29 DIAGNOSIS — I6529 Occlusion and stenosis of unspecified carotid artery: Secondary | ICD-10-CM | POA: Diagnosis not present

## 2013-11-29 DIAGNOSIS — E782 Mixed hyperlipidemia: Secondary | ICD-10-CM | POA: Insufficient documentation

## 2013-11-29 DIAGNOSIS — Z79899 Other long term (current) drug therapy: Secondary | ICD-10-CM

## 2013-11-29 MED ORDER — OMEPRAZOLE 40 MG PO CPDR: 40.0000 mg | DELAYED_RELEASE_CAPSULE | Freq: Every day | ORAL | Status: DC

## 2013-11-29 MED ORDER — FENOFIBRATE 160 MG PO TABS: ORAL_TABLET | ORAL | Status: DC

## 2013-11-29 NOTE — Assessment & Plan Note (Addendum)
Patient and I discussed risk of elevated TG and non-HDL and the importance of reducing her TG.  We discussed possible culprits of her elevated TG, including diet, genetics, and estrogen use.  She is due to see her gynecologist next week, and I will have her discuss changing oral estradiol to transdermal form in hopes of further reducing TG (I sent a staff message to Dr. Hulan Fray regarding this).  Patient agreeable to restart fenofibrate again since she tolerated it well, and TG are now > 800 mg/dL.  She has already made a lot of dietary improvement over past 4 weeks, so will continue with this diet until we recheck blood work in 2 months.   Plan: 1.  Start taking fenofibrate 160 mg once daily with your largest meal of the day. 2.  I will send a note to Dr. Hulan Fray about considering an estrogen patch instead of estrogen pill as this may help reduce triglycerides.  You see her in 10 days. 3.  Continue to avoid bacon, eggs, sausage in diet.  Limit desserts to no more than 2-3 times per week. 4.  Recheck cholesterol and liver in 2 months (01/30/14 - fasting labs at Dr. Rosezella [place] office on [address]) and see Ysidro Evert and Dr. Percival Spanish 1 week later on 02/06/14 at 9:00 am.

## 2013-11-29 NOTE — Progress Notes (Signed)
Patient is a pleasant 63 y.o. AAF who was referred to lipid clinic by Dr. Percival Spanish due to h/o elevated TG and non-HDL.  Patient states she has had allergic reactions to statins in the past (hives / dizziness).  She took fenofibrate in the past, but she took herself off this as she was trying to reduce medications she was taking.  She states she tolerated fenofibrate well.  She tried fish oil in the past, but had trouble swallowing these large capsules (never tried liquid fish oil or krill oil).  Her TG were > 800 mg/dL recently, and at that time she was told to cut out the eggs/sausage/bacon in her diet as she was eating this almost every morning.  Patient has been on oral estradiol for the past 20 years since her hysterectomy.  She tells me she tried to come off recently, but due to hot flashes, had to go right back on again.  She recently started using the nicotine patch as well as she is trying to quit smoking.  Failed Zyban and Chantix in the past due to side effects.  Patient has a h/o reflux and would like a 60 days supply of omeprazole refilled until she sees her PCP in 2 months.  RF:  Carotid stenosis, HTN, tobacco use (currently on NRT with patch), age, low HDL - LDL goal preferably < 100, and non-HDL goal < 130 Meds:  Not on lipid lowering meds currently. Intolerant:  Lipitor (dizziness), Zocor (hives)  Diet:  Previously ate eggs/sausage/bacon every morning, but in 09/2013 she stopped eating this.  Now eats fruit most mornings or toast.  She eats a sandwich most days for lunch, and a low fat dinner.  She eats out 2-3 times per week.  She does admit to eating desserts 2-3 times per week.  Rarely drinks soda.  Doesn't drink alcohol. Social history:  In process of trying to quit smoking.  On nicotine patch.  Doesn't drink alcohol. Exercise:  No routine exercise, but does all her chores and babysits for her 57 year old grandchild.  Labs: 09/2013:  TC 300, TG 828, HDL 29, LDL not calc, non-HDL 271, TSH  normal, glucose normal, LFTs normal (not on lipid lowering therapy.  On oral estradiol daily)  Current Outpatient Prescriptions  Medication Sig Dispense Refill  . ALPRAZolam (XANAX) 0.5 MG tablet Take 1 tablet (0.5 mg total) by mouth at bedtime as needed for sleep.  30 tablet  0  . aspirin 81 MG tablet Take 81 mg by mouth daily.      . bisoprolol-hydrochlorothiazide (ZIAC) 5-6.25 MG per tablet Take 1 tablet by mouth daily.  90 tablet  3  . cholecalciferol (VITAMIN D) 1000 UNITS tablet Take 1,000 Units by mouth daily.      Marland Kitchen estradiol (ESTRACE) 1 MG tablet TAKE ONE TABLET BY MOUTH ONCE DAILY--PT NEEDS OFFICE VISIT FOR ADDITIONAL REFILLS  30 tablet  11  . Guaifenesin (MUCINEX MAXIMUM STRENGTH) 1200 MG TB12 Take 1,200 mg by mouth every 12 (twelve) hours.      Marland Kitchen HYDROcodone-acetaminophen (NORCO) 5-325 MG per tablet Take 1 tablet by mouth every 6 (six) hours as needed for pain.  30 tablet  0  . omeprazole (PRILOSEC) 40 MG capsule Take 40 mg by mouth daily.       No current facility-administered medications for this visit.   Allergies  Allergen Reactions  . Amlodipine   . Chantix [Varenicline Tartrate] Other (See Comments)    Tongue swell,sob  . Clarithromycin Rash  .  Lisinopril Hives  . Simvastatin Hives  . Wellbutrin [Bupropion Hcl] Hives  . Lipitor [Atorvastatin]     Dizziness per patient   Family History  Problem Relation Age of Onset  . Cancer Father   . Diabetes Sister   . Stroke Maternal Grandfather

## 2013-11-29 NOTE — Progress Notes (Signed)
Echo performed. 

## 2013-11-29 NOTE — Patient Instructions (Signed)
1.  Start taking fenofibrate 160 mg once daily with your largest meal of the day. 2.  I will send a note to Dr. Hulan Fray about considering an estrogen patch instead of estrogen pill as this may help reduce triglycerides.  You see her in 10 days. 3.  Continue to avoid bacon, eggs, sausage in diet.  Limit desserts to no more than 2-3 times per week. 4.  Recheck cholesterol and liver in 2 months (01/30/14 - fasting labs at Dr. Rosezella Florida office on [address]) and see Hannah [name] and Dr. Percival Spanish 1 week later on 02/06/14 at 9:00 am.

## 2013-11-29 NOTE — Progress Notes (Signed)
Carotid duplex complete 

## 2013-12-01 ENCOUNTER — Encounter: Payer: Self-pay | Admitting: Family Medicine

## 2013-12-09 ENCOUNTER — Encounter: Payer: Self-pay | Admitting: Obstetrics & Gynecology

## 2013-12-09 ENCOUNTER — Ambulatory Visit (INDEPENDENT_AMBULATORY_CARE_PROVIDER_SITE_OTHER): Payer: Commercial Managed Care - HMO | Admitting: Family Medicine

## 2013-12-09 ENCOUNTER — Ambulatory Visit (INDEPENDENT_AMBULATORY_CARE_PROVIDER_SITE_OTHER): Payer: Commercial Managed Care - HMO | Admitting: Obstetrics & Gynecology

## 2013-12-09 VITALS — BP 126/74 | HR 66 | Temp 98.0°F | Resp 16 | Ht 61.5 in | Wt 129.2 lb

## 2013-12-09 VITALS — BP 117/67 | HR 70 | Temp 98.2°F | Ht 62.0 in | Wt 129.0 lb

## 2013-12-09 DIAGNOSIS — Z0000 Encounter for general adult medical examination without abnormal findings: Secondary | ICD-10-CM

## 2013-12-09 DIAGNOSIS — I10 Essential (primary) hypertension: Secondary | ICD-10-CM

## 2013-12-09 DIAGNOSIS — H6690 Otitis media, unspecified, unspecified ear: Secondary | ICD-10-CM

## 2013-12-09 MED ORDER — BISOPROLOL-HYDROCHLOROTHIAZIDE 5-6.25 MG PO TABS: 1.0000 | ORAL_TABLET | Freq: Every day | ORAL | Status: DC

## 2013-12-09 MED ORDER — ALPRAZOLAM 0.5 MG PO TABS: 0.5000 mg | ORAL_TABLET | Freq: Every evening | ORAL | Status: DC | PRN

## 2013-12-09 MED ORDER — AMOXICILLIN-POT CLAVULANATE 875-125 MG PO TABS: 1.0000 | ORAL_TABLET | Freq: Two times a day (BID) | ORAL | Status: DC

## 2013-12-09 NOTE — Progress Notes (Signed)
Subjective:    Hannah [name] is a 63 y.o. female who presents for an annual exam. The patient has no complaints today. The patient is not currently sexually active. GYN screening history: last pap: was normal. The patient wears seatbelts: yes. The patient participates in regular exercise: no. Has the patient ever been transfused or tattooed?: no. The patient reports that there is not domestic violence in her life.   Menstrual History: OB History   Grav Para Term Preterm Abortions TAB SAB Ect Mult Living                  Menarche age: 72  No LMP recorded. Patient has had a hysterectomy.    The following portions of the patient's history were reviewed and updated as appropriate: allergies, current medications, past family history, past medical history, past social history, past surgical history and problem list.  Review of Systems A comprehensive review of systems was negative.    Objective:    BP 117/67  Pulse 70  Temp(Src) 98.2 F (36.8 C) (Oral)  Ht 5\' 2"  (1.575 m)  Wt 129 lb (58.514 kg)  BMI 23.59 kg/m2  General Appearance:    Alert, cooperative, no distress, appears stated age  Head:    Normocephalic, without obvious abnormality, atraumatic  Eyes:    PERRL, conjunctiva/corneas clear, EOM's intact, fundi    benign, both eyes  Ears:    Normal TM's and external ear canals, both ears  Nose:   Nares normal, septum midline, mucosa normal, no drainage    or sinus tenderness  Throat:   Lips, mucosa, and tongue normal; teeth and gums normal  Neck:   Supple, symmetrical, trachea midline, no adenopathy;    thyroid:  no enlargement/tenderness/nodules; no carotid   bruit or JVD  Back:     Symmetric, no curvature, ROM normal, no CVA tenderness  Lungs:     Clear to auscultation bilaterally, respirations unlabored  Chest Wall:    No tenderness or deformity   Heart:    Regular rate and rhythm, S1 and S2 normal, no murmur, rub   or gallop  Breast Exam:    No tenderness, masses, or  nipple abnormality  Abdomen:     Soft, non-tender, bowel sounds active all four quadrants,    no masses, no organomegaly  Genitalia:    Normal female without lesion, discharge or tenderness, vulvovaginal atrophy, bimanual exam normal with no masses     Extremities:   Extremities normal, atraumatic, no cyanosis or edema  Pulses:   2+ and symmetric all extremities  Skin:   Skin color, texture, turgor normal, no rashes or lesions  Lymph nodes:   Cervical, supraclavicular, and axillary nodes normal  Neurologic:   CNII-XII intact, normal strength, sensation and reflexes    throughout  .    Assessment:    Healthy female exam.    Plan:     Breast self exam technique reviewed and patient encouraged to perform self-exam monthly. Mammogram.

## 2013-12-09 NOTE — Progress Notes (Signed)
Patient asked that I not schedule her mammogram because she usually has her PCP schedule this. Patient states that she will talk to her PCP and get it scheduled.

## 2013-12-09 NOTE — Progress Notes (Signed)
Patient ID: Hannah [name] MRN: [id number], DOB: March 03, 1951, 63 y.o. Date of Encounter: 12/09/2013, 10:39 AM  Primary Physician: Vic Blackbird, MD  Chief Complaint:  Chief Complaint  Patient presents with  . Sore Throat    x 2 days  . Otalgia    x 3 days  . Medication Refill    HPI: 63 y.o. year old female presents with 14 day history of otalgia. Symptoms began with nasal congestion, sore throat, and cough. Afebrile. Nasal congestion thick and green/yellow. Cough is  Sounds productive. Ear painful and  feels full, leading to sensation of muffled hearing. No drainage or discharge from affected ear. Has tried OTC cold preps without success. No GI complaints.  No sick contacts, recent antibiotics, or recent travels.   Here with otalgia Also needs refills on BP med and alprazolam  Past Medical History  Diagnosis Date  . Anxiety   . Depression   . Arthritis   . Hypertension   . Allergy   . Heart murmur      Home Meds: Prior to Admission medications   Medication Sig Start Date End Date Taking? Authorizing Provider  ALPRAZolam Duanne Moron) 0.5 MG tablet Take 1 tablet (0.5 mg total) by mouth at bedtime as needed for sleep. 07/27/13  Yes Orma Flaming, MD  aspirin 81 MG tablet Take 81 mg by mouth daily.   Yes Historical Provider, MD  bisoprolol-hydrochlorothiazide (ZIAC) 5-6.25 MG per tablet Take 1 tablet by mouth daily. 10/15/13 10/15/14 Yes Darlyne Russian, MD  cholecalciferol (VITAMIN D) 1000 UNITS tablet Take 1,000 Units by mouth daily.   Yes Historical Provider, MD  estradiol (ESTRACE) 1 MG tablet TAKE ONE TABLET BY MOUTH ONCE DAILY--PT NEEDS OFFICE VISIT FOR ADDITIONAL REFILLS   Yes Ryan M Dunn, PA-C  fenofibrate 160 MG tablet Take 1 tablet by mouth daily with largest meal of the day 11/29/13  Yes Minus Breeding, MD  Guaifenesin [facility] MAXIMUM STRENGTH) 1200 MG TB12 Take 1,200 mg by mouth every 12 (twelve) hours.   Yes Historical Provider, MD  HYDROcodone-acetaminophen (NORCO)  5-325 MG per tablet Take 1 tablet by mouth every 6 (six) hours as needed for pain. 10/08/12  Yes Darlyne Russian, MD  omeprazole (PRILOSEC) 40 MG capsule Take 1 capsule (40 mg total) by mouth daily. 11/29/13  Yes Minus Breeding, MD    Allergies:  Allergies  Allergen Reactions  . Amlodipine   . Chantix [Varenicline Tartrate] Other (See Comments)    Tongue swell,sob  . Clarithromycin Rash  . Lisinopril Hives  . Simvastatin Hives  . Wellbutrin [Bupropion Hcl] Hives  . Lipitor [Atorvastatin]     Dizziness per patient    History   Social History  . Marital Status: Divorced    Spouse Name: N/A    Number of Children: N/A  . Years of Education: N/A   Occupational History  . Not on file.   Social History Main Topics  . Smoking status: Former Smoker -- 0.50 packs/day for 40 years    Types: Cigarettes    Quit date: 11/18/2013  . Smokeless tobacco: Never Used  . Alcohol Use: No  . Drug Use: No  . Sexual Activity: No   Other Topics Concern  . Not on file   Social History Narrative  . No narrative on file     Review of Systems: Constitutional: negative for chills, fever, night sweats or weight changes HEENT: see above Respiratory: negative for hemoptysis, wheezing, or shortness of breath Abdominal: negative  for abdominal pain, nausea, vomiting or diarrhea Dermatological: negative for rash Neurologic: negative for headache   Physical Exam: Blood pressure 126/74, pulse 66, temperature 98 F (36.7 C), temperature source Oral, resp. rate 16, height 5' 1.5" (1.562 m), weight 129 lb 3.2 oz (58.605 kg), SpO2 100.00%., Body mass index is 24.02 kg/(m^2). General: Well developed, well nourished, in no acute distress. Head: Normocephalic, atraumatic, eyes without discharge, sclera non-icteric, nares are congested. Bilateral auditory canals clear. right TM erythematous, dull, and bulging with purulent effusion behind. No perforation visualized. Contralateral TM pearly grey with reflective  cone of light, no effusion or perforation visualized but there is what appears to be a white Q-tip in the canal. Oral cavity moist, dentition normal. Posterior pharynx with post nasal drip and mild erythema. No peritonsillar abscess or tonsillar exudate. Neck: Supple. No thyromegaly. Full ROM. No lymphadenopathy. Lungs: Clear bilaterally to auscultation without wheezes, rales, or rhonchi. Breathing is unlabored.  Heart: RRR with S1 S2. No murmurs, rubs, or gallops appreciated. Abdomen: Soft, non-tender, non-distended with normoactive bowel sounds. No hepatosplenomegaly. No rebound/guarding. No obvious abdominal masses. McBurney's, Rovsing's, Iliopsoas, and table jar all negative. Msk:  Strength and tone normal for age. Extremities: No clubbing or cyanosis. No edema. Neuro: Alert and oriented X 3. Moves all extremities spontaneously. CNII-XII grossly in tact. Psych:  Responds to questions appropriately with a normal affect.     ASSESSMENT AND PLAN:  63 y.o. year old female with acute otitis media of both ear without perforation. Otitis media - Plan: amoxicillin-clavulanate (AUGMENTIN) 875-125 MG per tablet  Unspecified essential hypertension - Plan: bisoprolol-hydrochlorothiazide (ZIAC) 5-6.25 MG per tablet  Alprazolam refilled for Dr. Casimiro Needle - -Mucinex for children -Tylenol prn -Rest/fluids -RTC precautions -RTC 3 days if no improvement  Signed, Robyn Haber, md 12/09/2013 10:39 AM

## 2013-12-09 NOTE — Progress Notes (Signed)
Patient states she is due for her annual check up today. Requests to be tested for trich as she was told she had it last year and was given treatment but it has been worrying her for the entire year about whether or not she still has it. States she was never symptomatic.

## 2013-12-19 ENCOUNTER — Ambulatory Visit (INDEPENDENT_AMBULATORY_CARE_PROVIDER_SITE_OTHER): Payer: Commercial Managed Care - HMO | Admitting: Family Medicine

## 2013-12-19 VITALS — BP 110/68 | HR 60 | Temp 98.0°F | Resp 18 | Ht 61.0 in | Wt 128.8 lb

## 2013-12-19 DIAGNOSIS — J0110 Acute frontal sinusitis, unspecified: Secondary | ICD-10-CM

## 2013-12-19 DIAGNOSIS — J0111 Acute recurrent frontal sinusitis: Secondary | ICD-10-CM

## 2013-12-19 MED ORDER — AMOXICILLIN 400 MG/5ML PO SUSR: ORAL | Status: DC

## 2013-12-19 NOTE — Patient Instructions (Signed)
Use the amoxicillin as directed.  Let me know if you are not feeling better soon!

## 2013-12-19 NOTE — Progress Notes (Signed)
[facility] [address] [place] [address] [phone]  Date:  12/19/2013   Name:  Hannah [name]   DOB:  March 06, 1951   MRN:  [id number]  PCP:  Vic Blackbird, MD    Chief Complaint: Otalgia   History of Present Illness:  Hannah [name] is a 63 y.o. very pleasant female patient who presents with the following:  She was here on 6/12 with OM and was started on augmentin.  It also says she had a q-tip in her left ear canal (or could have meant just appearance like a qtip?). She states that "my head hurts, its feels like it's 'this big.'" She took maybe 2 of the augmentin but they were too big to swallow, and crushing them seemed to upset her stomach.  Her left ear is still sore, but she now hurts more in her face.  No fever, some cough.  She did have a ST last night.  She is not blowing much material out of her nose No GI symptms.  She would like to use a liquid medication if needed  Patient Active Problem List   Diagnosis Date Noted  . Mixed hyperlipidemia 11/29/2013  . Carpal tunnel syndrome, bilateral 11/23/2013  . DJD (degenerative joint disease) 11/23/2013  . Murmur 11/10/2013  . Acne 08/26/2013  . High risk HPV infection 12/16/2012  . Chronic constipation 12/16/2012  . Arthritis 10/08/2012  . Unspecified episodic mood disorder 10/08/2012  . Nicotine abuse 12/29/2011    Past Medical History  Diagnosis Date  . Anxiety   . Depression   . Arthritis   . Hypertension   . Allergy   . Heart murmur     Past Surgical History  Procedure Laterality Date  . Spine surgery    . Abdominal hysterectomy    . Tubal ligation      History  Substance Use Topics  . Smoking status: Former Smoker -- 0.50 packs/day for 40 years    Types: Cigarettes    Quit date: 11/18/2013  . Smokeless tobacco: Never Used  . Alcohol Use: No    Family History  Problem Relation Age of Onset  . Cancer Father   . Diabetes Sister   . Stroke Maternal Grandfather      Allergies  Allergen Reactions  . Amlodipine   . Chantix [Varenicline Tartrate] Other (See Comments)    Tongue swell,sob  . Clarithromycin Rash  . Lisinopril Hives  . Simvastatin Hives  . Wellbutrin [Bupropion Hcl] Hives  . Lipitor [Atorvastatin]     Dizziness per patient    Medication list has been reviewed and updated.  Current Outpatient Prescriptions on File Prior to Visit  Medication Sig Dispense Refill  . ALPRAZolam (XANAX) 0.5 MG tablet Take 1 tablet (0.5 mg total) by mouth at bedtime as needed for sleep.  30 tablet  5  . aspirin 81 MG tablet Take 81 mg by mouth daily.      . bisoprolol-hydrochlorothiazide (ZIAC) 5-6.25 MG per tablet Take 1 tablet by mouth daily.  90 tablet  3  . cholecalciferol (VITAMIN D) 1000 UNITS tablet Take 1,000 Units by mouth daily.      Marland Kitchen estradiol (ESTRACE) 1 MG tablet TAKE ONE TABLET BY MOUTH ONCE DAILY--PT NEEDS OFFICE VISIT FOR ADDITIONAL REFILLS  30 tablet  11  . fenofibrate 160 MG tablet Take 1 tablet by mouth daily with largest meal of the day  30 tablet  5  . Guaifenesin (MUCINEX MAXIMUM STRENGTH) 1200  MG TB12 Take 1,200 mg by mouth every 12 (twelve) hours.      Marland Kitchen HYDROcodone-acetaminophen (NORCO) 5-325 MG per tablet Take 1 tablet by mouth every 6 (six) hours as needed for pain.  30 tablet  0  . omeprazole (PRILOSEC) 40 MG capsule Take 1 capsule (40 mg total) by mouth daily.  30 capsule  1   No current facility-administered medications on file prior to visit.    Review of Systems:  As per HPI- otherwise negative.   Physical Examination: Filed Vitals:   12/19/13 1149  BP: 110/68  Pulse: 60  Temp: 98 F (36.7 C)  Resp: 18   Filed Vitals:   12/19/13 1149  Height: 5\' 1"  (1.549 m)  Weight: 128 lb 12.8 oz (58.423 kg)   Body mass index is 24.35 kg/(m^2). Ideal Body Weight: Weight in (lb) to have BMI = 25: 132  GEN: WDWN, NAD, Non-toxic, A & O x 3, looks well HEENT: Atraumatic, Normocephalic. Neck supple. No masses, No LAD.   Bilateral TM wnl, oropharynx normal.  PEERL,EOMI.  Sinuses are tender to percussion- frontal Ears and Nose: No external deformity. CV: RRR, No M/G/R. No JVD. No thrill. No extra heart sounds. PULM: CTA B, no wheezes, crackles, rhonchi. No retractions. No resp. distress. No accessory muscle use. ABD: S, NT, ND EXTR: No c/c/e NEURO Normal gait.  PSYCH: Normally interactive. Conversant. Not depressed or anxious appearing.  Calm demeanor.    Assessment and Plan: Acute recurrent frontal sinusitis - Plan: amoxicillin (AMOXIL) 400 MG/5ML suspension  She is unable to tolerate augmentin.  Will try liquid amoxicillin as above for acute sinusitis.   Meds ordered this encounter  Medications  . DISCONTD: amoxicillin (AMOXIL) 400 MG/5ML suspension    Sig: Take 10 mg twice a day for 10 days    Dispense:  200 mL    Refill:  0  . amoxicillin (AMOXIL) 400 MG/5ML suspension    Sig: Take 10 ml twice a day for 10 days    Dispense:  200 mL    Refill:  0   Called pharm and clarified- 10mg , not 10 mg BID  Signed Lamar Blinks, MD

## 2013-12-23 ENCOUNTER — Other Ambulatory Visit: Payer: Self-pay

## 2013-12-23 MED ORDER — OMEPRAZOLE 40 MG PO CPDR: 40.0000 mg | DELAYED_RELEASE_CAPSULE | Freq: Every day | ORAL | Status: DC

## 2013-12-29 ENCOUNTER — Emergency Department (HOSPITAL_COMMUNITY)
Admission: EM | Admit: 2013-12-29 | Discharge: 2013-12-29 | Disposition: A | Discharge status: Home / Self Care | Payer: Medicare HMO | Attending: Emergency Medicine | Admitting: Emergency Medicine

## 2013-12-29 ENCOUNTER — Encounter (HOSPITAL_COMMUNITY): Payer: Self-pay | Admitting: Emergency Medicine

## 2013-12-29 DIAGNOSIS — Z9109 Other allergy status, other than to drugs and biological substances: Secondary | ICD-10-CM

## 2013-12-29 DIAGNOSIS — Z7982 Long term (current) use of aspirin: Secondary | ICD-10-CM | POA: Insufficient documentation

## 2013-12-29 DIAGNOSIS — F329 Major depressive disorder, single episode, unspecified: Secondary | ICD-10-CM | POA: Insufficient documentation

## 2013-12-29 DIAGNOSIS — Z792 Long term (current) use of antibiotics: Secondary | ICD-10-CM | POA: Insufficient documentation

## 2013-12-29 DIAGNOSIS — M129 Arthropathy, unspecified: Secondary | ICD-10-CM | POA: Insufficient documentation

## 2013-12-29 DIAGNOSIS — J309 Allergic rhinitis, unspecified: Secondary | ICD-10-CM | POA: Insufficient documentation

## 2013-12-29 DIAGNOSIS — R011 Cardiac murmur, unspecified: Secondary | ICD-10-CM | POA: Insufficient documentation

## 2013-12-29 DIAGNOSIS — F3289 Other specified depressive episodes: Secondary | ICD-10-CM | POA: Insufficient documentation

## 2013-12-29 DIAGNOSIS — I10 Essential (primary) hypertension: Secondary | ICD-10-CM | POA: Insufficient documentation

## 2013-12-29 DIAGNOSIS — F411 Generalized anxiety disorder: Secondary | ICD-10-CM | POA: Insufficient documentation

## 2013-12-29 DIAGNOSIS — Z79899 Other long term (current) drug therapy: Secondary | ICD-10-CM | POA: Insufficient documentation

## 2013-12-29 DIAGNOSIS — Z87891 Personal history of nicotine dependence: Secondary | ICD-10-CM | POA: Insufficient documentation

## 2013-12-29 MED ORDER — FEXOFENADINE HCL 180 MG PO TABS: 180.0000 mg | ORAL_TABLET | Freq: Every day | ORAL | Status: DC

## 2013-12-29 NOTE — Discharge Instructions (Signed)
Allergies °Allergies may happen from anything your body is sensitive to. This may be food, medicines, pollens, chemicals, and nearly anything around you in everyday life that produces allergens. An allergen is anything that causes an allergy producing substance. Heredity is often a factor in causing these problems. This means you may have some of the same allergies as your parents. °Food allergies happen in all age groups. Food allergies are some of the most severe and life threatening. Some common food allergies are cow's milk, seafood, eggs, nuts, wheat, and soybeans. °SYMPTOMS  °· Swelling around the mouth. °· An itchy red rash or hives. °· Vomiting or diarrhea. °· Difficulty breathing. °SEVERE ALLERGIC REACTIONS ARE LIFE-THREATENING. °This reaction is called anaphylaxis. It can cause the mouth and throat to swell and cause difficulty with breathing and swallowing. In severe reactions only a trace amount of food (for example, peanut oil in a salad) may cause death within seconds. °Seasonal allergies occur in all age groups. These are seasonal because they usually occur during the same season every year. They may be a reaction to molds, grass pollens, or tree pollens. Other causes of problems are house dust mite allergens, pet dander, and mold spores. The symptoms often consist of nasal congestion, a runny itchy nose associated with sneezing, and tearing itchy eyes. There is often an associated itching of the mouth and ears. The problems happen when you come in contact with pollens and other allergens. Allergens are the particles in the air that the body reacts to with an allergic reaction. This causes you to release allergic antibodies. Through a chain of events, these eventually cause you to release histamine into the blood stream. Although it is meant to be protective to the body, it is this release that causes your discomfort. This is why you were given anti-histamines to feel better.  If you are unable to  pinpoint the offending allergen, it may be determined by skin or blood testing. Allergies cannot be cured but can be controlled with medicine. °Hay fever is a collection of all or some of the seasonal allergy problems. It may often be treated with simple over-the-counter medicine such as diphenhydramine. Take medicine as directed. Do not drink alcohol or drive while taking this medicine. Check with your caregiver or package insert for child dosages. °If these medicines are not effective, there are many new medicines your caregiver can prescribe. Stronger medicine such as nasal spray, eye drops, and corticosteroids may be used if the first things you try do not work well. Other treatments such as immunotherapy or desensitizing injections can be used if all else fails. Follow up with your caregiver if problems continue. These seasonal allergies are usually not life threatening. They are generally more of a nuisance that can often be handled using medicine. °HOME CARE INSTRUCTIONS  °· If unsure what causes a reaction, keep a diary of foods eaten and symptoms that follow. Avoid foods that cause reactions. °· If hives or rash are present: °¨ Take medicine as directed. °¨ You may use an over-the-counter antihistamine (diphenhydramine) for hives and itching as needed. °¨ Apply cold compresses (cloths) to the skin or take baths in cool water. Avoid hot baths or showers. Heat will make a rash and itching worse. °· If you are severely allergic: °¨ Following a treatment for a severe reaction, hospitalization is often required for closer follow-up. °¨ Wear a medic-alert bracelet or necklace stating the allergy. °¨ You and your family must learn how to give adrenaline or use   an anaphylaxis kit.  If you have had a severe reaction, always carry your anaphylaxis kit or EpiPen with you. Use this medicine as directed by your caregiver if a severe reaction is occurring. Failure to do so could have a fatal outcome. SEEK MEDICAL  CARE IF:  You suspect a food allergy. Symptoms generally happen within 30 minutes of eating a food.  Your symptoms have not gone away within 2 days or are getting worse.  You develop new symptoms.  You want to retest yourself or your child with a food or drink you think causes an allergic reaction. Never do this if an anaphylactic reaction to that food or drink has happened before. Only do this under the care of a caregiver. SEEK IMMEDIATE MEDICAL CARE IF:   You have difficulty breathing, are wheezing, or have a tight feeling in your chest or throat.  You have a swollen mouth, or you have hives, swelling, or itching all over your body.  You have had a severe reaction that has responded to your anaphylaxis kit or an EpiPen. These reactions may return when the medicine has worn off. These reactions should be considered life threatening. MAKE SURE YOU:   Understand these instructions.  Will watch your condition.  Will get help right away if you are not doing well or get worse. Document Released: 09/09/2002 Document Revised: 10/11/2012 Document Reviewed: 02/14/2008 [facility] Patient Information 2015 [place], [place]. This information is not intended to replace advice given to you by your health care provider. Make sure you discuss any questions you have with your health care provider. Stop the antibiotic your ears do not appear infected at this time Make an appointment with Dr. Benjamine Mola for further evaluation of the your allergy symptoms, ear discomfort, and enlarged taste buds on the posterior portion of your tongue

## 2013-12-29 NOTE — ED Provider Notes (Signed)
CSN: [id number]     Arrival date & time 12/29/13  2121 History   First MD Initiated Contact with Patient 12/29/13 2137     This chart was scribed for non-physician practitioner, Junius Creamer, [place] working with Jasper Riling. Alvino Chapel, MD by Forrestine Him, ED Scribe. This patient was seen in room [place] and the patient's care was started at 10:07 PM.   Chief Complaint  Patient presents with  . Oral Swelling   The history is provided by the patient. No language interpreter was used.    HPI Comments: ADAHLIA STEMBRIDGE is a 63 y.o. Female with a HTN, Arthritis, or Anxiety who presents to the Emergency Department complaining of oral swelling onset this morning after brushing her teeth. She describes this as "bumps to the back of the tongue". She also mentions some trouble swallowing onset several weeks. She admits to a previous episode 09/2013. Currently she is on a 10 day course of Amoxicillin for L otalgia with 1 week remaining of her prescription. At this time she denies any fever or chills. She has no other pertinent past medical history. No other concerns this visit.  Past Medical History  Diagnosis Date  . Anxiety   . Depression   . Arthritis   . Hypertension   . Allergy   . Heart murmur    Past Surgical History  Procedure Laterality Date  . Spine surgery    . Abdominal hysterectomy    . Tubal ligation     Family History  Problem Relation Age of Onset  . Cancer Father   . Diabetes Sister   . Stroke Maternal Grandfather    History  Substance Use Topics  . Smoking status: Former Smoker -- 0.50 packs/day for 40 years    Types: Cigarettes    Quit date: 11/18/2013  . Smokeless tobacco: Never Used  . Alcohol Use: No   OB History   Grav Para Term Preterm Abortions TAB SAB Ect Mult Living                 Review of Systems  Constitutional: Negative for fever and chills.  HENT: Positive for trouble swallowing.        Several enlarged papillae on tongue      Allergies   Amlodipine; Chantix; Clarithromycin; Lisinopril; Simvastatin; Wellbutrin; and Lipitor  Home Medications   Prior to Admission medications   Medication Sig Start Date End Date Taking? Authorizing Provider  ALPRAZolam Duanne Moron) 0.5 MG tablet Take 1 tablet (0.5 mg total) by mouth at bedtime as needed for sleep. 12/09/13   Robyn Haber, MD  amoxicillin (AMOXIL) 400 MG/5ML suspension Take 10 ml twice a day for 10 days 12/19/13   Darreld Mclean, MD  aspirin 81 MG tablet Take 81 mg by mouth daily.    Historical Provider, MD  bisoprolol-hydrochlorothiazide [facility]) 5-6.25 MG per tablet Take 1 tablet by mouth daily. 12/09/13 12/09/14  Robyn Haber, MD  cholecalciferol (VITAMIN D) 1000 UNITS tablet Take 1,000 Units by mouth daily.    Historical Provider, MD  estradiol (ESTRACE) 1 MG tablet TAKE ONE TABLET BY MOUTH ONCE DAILY--PT NEEDS OFFICE VISIT FOR ADDITIONAL REFILLS    Ryan M Dunn, PA-C  fenofibrate 160 MG tablet Take 1 tablet by mouth daily with largest meal of the day 11/29/13   Minus Breeding, MD  fexofenadine (ALLEGRA) 180 MG tablet Take 1 tablet (180 mg total) by mouth daily. 12/29/13   Garald Balding, NP  Guaifenesin [facility] MAXIMUM STRENGTH) 1200 MG TB12  Take 1,200 mg by mouth every 12 (twelve) hours.    Historical Provider, MD  HYDROcodone-acetaminophen (NORCO) 5-325 MG per tablet Take 1 tablet by mouth every 6 (six) hours as needed for pain. 10/08/12   Darlyne Russian, MD  omeprazole (PRILOSEC) 40 MG capsule Take 1 capsule (40 mg total) by mouth daily. 12/23/13   Minus Breeding, MD   Triage Vitals: BP 122/68  Pulse 55  Temp(Src) 98.2 F (36.8 C) (Oral)  SpO2 100%   Physical Exam  Nursing note and vitals reviewed. Constitutional: She is oriented to person, place, and time. She appears well-developed and well-nourished.  HENT:  Head: Normocephalic.  Several enlarged papillae on tongue  Eyes: EOM are normal.  Neck: Normal range of motion.  Pulmonary/Chest: Effort normal.  Abdominal: She  exhibits no distension.  Musculoskeletal: Normal range of motion.  Neurological: She is alert and oriented to person, place, and time.  Psychiatric: She has a normal mood and affect.    ED Course  Procedures (including critical care time)  DIAGNOSTIC STUDIES: Oxygen Saturation is 100% on RA, Normal by my interpretation.    COORDINATION OF CARE: 11:54 PM-Discussed treatment plan with pt at bedside and pt agreed to plan.     Labs Review Labs Reviewed - No data to display  Imaging Review No results found.   EKG Interpretation None      MDM   Final diagnoses:  Allergy to environmental factors      I personally performed the services described in this documentation, which was scribed in my presence. The recorded information has been reviewed and is accurate.    Garald Balding, NP 12/29/13 2354

## 2013-12-29 NOTE — ED Notes (Signed)
Patient states that she was brushing her teeth today and notice bumps to the back of her tongue. She states this occurred back in April while she was taking an antibiotic. She stopped taking the medication and they went away. She is not currently on any medications at this time. She denies pain.

## 2013-12-29 NOTE — ED Notes (Signed)
MD at bedside. 

## 2013-12-30 NOTE — ED Provider Notes (Signed)
Medical screening examination/treatment/procedure(s) were performed by non-physician practitioner and as supervising physician I was immediately available for consultation/collaboration.   EKG Interpretation None       Jasper Riling. Alvino Chapel, MD 12/30/13 [phone]

## 2014-01-17 ENCOUNTER — Encounter: Payer: Self-pay | Admitting: Cardiology

## 2014-01-18 ENCOUNTER — Telehealth: Payer: Self-pay | Admitting: Cardiology

## 2014-01-18 NOTE — Telephone Encounter (Signed)
Please advise this pt. On an appt

## 2014-01-18 NOTE — Telephone Encounter (Signed)
New problem   Pt is having knee sx 02/13/14 and Dr Percival Spanish wanted to see pt before sx. Pt had to cancel 02/06/14 appt due to change in physician sched, please advise patient about an appt.

## 2014-01-30 ENCOUNTER — Encounter (HOSPITAL_COMMUNITY): Payer: Self-pay | Admitting: Pharmacy Technician

## 2014-01-30 ENCOUNTER — Other Ambulatory Visit (INDEPENDENT_AMBULATORY_CARE_PROVIDER_SITE_OTHER): Payer: Commercial Managed Care - HMO

## 2014-01-30 DIAGNOSIS — Z79899 Other long term (current) drug therapy: Secondary | ICD-10-CM

## 2014-01-30 DIAGNOSIS — E782 Mixed hyperlipidemia: Secondary | ICD-10-CM

## 2014-01-30 LAB — LIPID PANEL
Cholesterol: 196 mg/dL (ref 0–200)
HDL: 35.3 mg/dL — ABNORMAL LOW (ref >39.00)
NonHDL: 160.7
Total CHOL/HDL Ratio: 6
Triglycerides: 271 mg/dL — ABNORMAL HIGH (ref 0.0–149.0)
VLDL: 54.2 mg/dL — ABNORMAL HIGH (ref 0.0–40.0)

## 2014-01-30 LAB — HEPATIC FUNCTION PANEL
ALT: 19 U/L (ref 0–35)
AST: 28 U/L (ref 0–37)
Albumin: 4.2 g/dL (ref 3.5–5.2)
Alkaline Phosphatase: 41 U/L (ref 39–117)
Bilirubin, Direct: 0 mg/dL (ref 0.0–0.3)
Total Bilirubin: 0.5 mg/dL (ref 0.2–1.2)
Total Protein: 7 g/dL (ref 6.0–8.3)

## 2014-01-30 LAB — LDL CHOLESTEROL, DIRECT: Direct LDL: 115.5 mg/dL

## 2014-02-01 ENCOUNTER — Telehealth: Payer: Self-pay | Admitting: Cardiovascular Disease

## 2014-02-02 ENCOUNTER — Other Ambulatory Visit: Payer: Self-pay | Admitting: Orthopedic Surgery

## 2014-02-02 ENCOUNTER — Encounter: Payer: Commercial Managed Care - HMO | Admitting: Emergency Medicine

## 2014-02-02 NOTE — Telephone Encounter (Signed)
Closed encounter °

## 2014-02-06 ENCOUNTER — Ambulatory Visit (INDEPENDENT_AMBULATORY_CARE_PROVIDER_SITE_OTHER): Payer: Commercial Managed Care - HMO | Admitting: Pharmacist

## 2014-02-06 ENCOUNTER — Ambulatory Visit: Payer: 59 | Admitting: Cardiology

## 2014-02-06 DIAGNOSIS — Z79899 Other long term (current) drug therapy: Secondary | ICD-10-CM

## 2014-02-06 DIAGNOSIS — E782 Mixed hyperlipidemia: Secondary | ICD-10-CM

## 2014-02-06 MED ORDER — PRAVASTATIN SODIUM 20 MG PO TABS: 20.0000 mg | ORAL_TABLET | Freq: Every evening | ORAL | Status: DC

## 2014-02-06 NOTE — Assessment & Plan Note (Signed)
Her triglycerides have dropped from 828 mg/dL to 271 mg/dL since adding fenofibrate and improved diet.  She is tolerating regimen well.  Her non-HDL is 160 with goal of < 130.  Given h/o carotid stenosis, would like to get her on statin, but given failed simvastatin and lipitor, and generic preferred, will have her start low-moderate dose pravastatin 20 mg once daily.  Patient will call me if she develops side effects from this agent.  Will recheck lipid / liver in 3 months, and see me the following week.

## 2014-02-06 NOTE — Patient Instructions (Signed)
1.  Start pravastatin 20 mg once daily in the evening.   2.  Continue fenofibrate 160 mg once daily with largest meal of the day. 3.  Continue to avoid bacon/eggs/sausage/fried foods. 4.  Recheck blood work (cholesterol and liver) in 3 months (05/11/14 fasting labs - show up anytime after 7:30 am) and see Ysidro Evert a few days later on 05/16/14 at 9:00 am.  [phone]

## 2014-02-06 NOTE — Progress Notes (Signed)
Patient is a pleasant 63 y.o. AAF who was referred to lipid clinic by Dr. Percival Spanish due to h/o elevated TG and non-HDL.  Patient states she has had allergic reactions to statins in the past (hives / dizziness).  She started fenofibrate 160 mg qd 11/2013 due to TG of 800 mg/dL at that time.  She tried fish oil in the past, but had trouble swallowing these large capsules (never tried liquid fish oil or krill oil).  She was told to cut out the eggs/sausage/bacon in her diet as she was eating this almost every morning, and she has done this over past few months.  Now eating oatmeal most mornings.    Patient has been on oral estradiol for the past 20 years since her hysterectomy.  She tells me she tried to come off recently, but due to hot flashes, had to go right back on again.  She has cut her tobacco use down from 1 ppd down to 1/4 ppd.  Failed Zyban and Chantix in the past due to side effects.  Tried the patch, but non-compliant due to skin breaking out in a rash. Knee surgery next week.  RF:  Carotid stenosis, HTN, tobacco use (1/4 ppd), age, low HDL - LDL goal preferably < 100, and non-HDL goal < 130 Meds:  Fenofibrate 160 mg qd. Intolerant:  Lipitor (dizziness), Zocor (hives)  Diet:  Previously ate eggs/sausage/bacon every morning, but in 09/2013 she stopped eating this.  Now eats fruit most mornings or toast.  She eats a sandwich most days for lunch, and a low fat dinner.  She eats out 2-3 times per week.  She does admit to eating desserts 2-3 times per week.  Rarely drinks soda.  Doesn't drink alcohol. Social history:  In process of trying to quit smoking.  On nicotine patch.  Smoking 1/4 ppd.  Doesn't drink alcohol. Exercise:  No routine exercise.  Having total knee replacement next week.  Labs: 01/2014:  TC 196, TG 271, HDL 35, LDL 115, non-HDL 160, LFTs normal (fenofibrate 160 mg qd) 09/2013:  TC 300, TG 828, HDL 29, LDL not calc, non-HDL 271, TSH normal, glucose normal, LFTs normal (not on lipid  lowering therapy.  On oral estradiol daily)  Current Outpatient Prescriptions  Medication Sig Dispense Refill  . ALPRAZolam (XANAX) 0.5 MG tablet Take 0.5 mg by mouth at bedtime as needed for sleep.      Marland Kitchen aspirin 81 MG tablet Take 81 mg by mouth daily.      . bisoprolol-hydrochlorothiazide (ZIAC) 5-6.25 MG per tablet Take 1 tablet by mouth daily.  90 tablet  3  . cholecalciferol (VITAMIN D) 1000 UNITS tablet Take 1,000 Units by mouth daily.      Marland Kitchen estradiol (ESTRACE) 1 MG tablet Take 1 mg by mouth daily.      . fenofibrate 160 MG tablet Take 160 mg by mouth daily.      . fexofenadine (ALLEGRA) 180 MG tablet Take 180 mg by mouth daily.      Marland Kitchen HYDROcodone-acetaminophen (NORCO) 5-325 MG per tablet Take 1 tablet by mouth every 6 (six) hours as needed for pain.  30 tablet  0  . omeprazole (PRILOSEC) 40 MG capsule Take 40 mg by mouth daily.       No current facility-administered medications for this visit.   Allergies  Allergen Reactions  . Amlodipine   . Chantix [Varenicline Tartrate] Other (See Comments)    Tongue swell,sob  . Clarithromycin Rash  . Lisinopril Hives  .  Simvastatin Hives  . Wellbutrin [Bupropion Hcl] Hives  . Lipitor [Atorvastatin]     Dizziness per patient   Family History  Problem Relation Age of Onset  . Cancer Father   . Diabetes Sister   . Stroke Maternal Grandfather

## 2014-02-07 NOTE — Pre-Procedure Instructions (Signed)
BRAELYNN BENNING  02/07/2014   Your procedure is scheduled on:  Monday, August 17th  Report to [facility] at 1030 AM.  Call this number if you have problems the morning of surgery: [phone]   Remember:   Do not eat food or drink liquids after midnight.   Take these medicines the morning of surgery with A SIP OF WATER: prilosec, pain medication if needed   Do not wear jewelry, make-up or nail polish.  Do not wear lotions, powders, or perfumes. You may wear deodorant.  Do not shave 48 hours prior to surgery. Men may shave face and neck.  Do not bring valuables to the hospital.  [facility] is not responsible  for any belongings or valuables.               Contacts, dentures or bridgework may not be worn into surgery.  Leave suitcase in the car. After surgery it may be brought to your room.  For patients admitted to the hospital, discharge time is determined by your  treatment team.               Patients discharged the day of surgery will not be allowed to drive home.  Please read over the following fact sheets that you were given: Pain Booklet, Coughing and Deep Breathing, Blood Transfusion Information, MRSA Information and Surgical Site Infection Prevention [place] - Preparing for Surgery  Before surgery, you can play an important role.  Because skin is not sterile, your skin needs to be as free of germs as possible.  You can reduce the number of germs on you skin by washing with CHG (chlorahexidine gluconate) soap before surgery.  CHG is an antiseptic cleaner which kills germs and bonds with the skin to continue killing germs even after washing.  Please DO NOT use if you have an allergy to CHG or antibacterial soaps.  If your skin becomes reddened/irritated stop using the CHG and inform your nurse when you arrive at Short Stay.  Do not shave (including legs and underarms) for at least 48 hours prior to the first CHG shower.  You may shave your  face.  Please follow these instructions carefully:   1.  Shower with CHG Soap the night before surgery and the morning of Surgery.  2.  If you choose to wash your hair, wash your hair first as usual with your normal shampoo.  3.  After you shampoo, rinse your hair and body thoroughly to remove the shampoo.  4.  Use CHG as you would any other liquid soap.  You can apply CHG directly to the skin and wash gently with scrungie or a clean washcloth.  5.  Apply the CHG Soap to your body ONLY FROM THE NECK DOWN.  Do not use on open wounds or open sores.  Avoid contact with your eyes, ears, mouth and genitals (private parts).  Wash genitals (private parts) with your normal soap.  6.  Wash thoroughly, paying special attention to the area where your surgery will be performed.  7.  Thoroughly rinse your body with warm water from the neck down.  8.  DO NOT shower/wash with your normal soap after using and rinsing off the CHG Soap.  9.  Pat yourself dry with a clean towel.            10.  Wear clean pajamas.            11.  Place clean sheets  on your bed the night of your first shower and do not sleep with pets.  Day of Surgery  Do not apply any lotions/deoderants the morning of surgery.  Please wear clean clothes to the hospital/surgery center.

## 2014-02-08 ENCOUNTER — Encounter: Payer: Self-pay | Admitting: Cardiology

## 2014-02-08 ENCOUNTER — Encounter (HOSPITAL_COMMUNITY)
Admission: RE | Admit: 2014-02-08 | Discharge: 2014-02-08 | Disposition: A | Discharge status: Home / Self Care | Payer: Medicare HMO | Source: Ambulatory Visit | Attending: Orthopedic Surgery | Admitting: Orthopedic Surgery

## 2014-02-08 ENCOUNTER — Ambulatory Visit (INDEPENDENT_AMBULATORY_CARE_PROVIDER_SITE_OTHER): Payer: Commercial Managed Care - HMO | Admitting: Cardiology

## 2014-02-08 ENCOUNTER — Other Ambulatory Visit (HOSPITAL_COMMUNITY): Payer: Commercial Managed Care - HMO

## 2014-02-08 ENCOUNTER — Encounter (HOSPITAL_COMMUNITY): Payer: Self-pay

## 2014-02-08 VITALS — BP 128/70 | HR 59 | Ht 62.0 in | Wt 130.8 lb

## 2014-02-08 DIAGNOSIS — M170 Bilateral primary osteoarthritis of knee: Secondary | ICD-10-CM

## 2014-02-08 DIAGNOSIS — I739 Peripheral vascular disease, unspecified: Secondary | ICD-10-CM | POA: Insufficient documentation

## 2014-02-08 DIAGNOSIS — F17200 Nicotine dependence, unspecified, uncomplicated: Secondary | ICD-10-CM | POA: Insufficient documentation

## 2014-02-08 DIAGNOSIS — Z01812 Encounter for preprocedural laboratory examination: Secondary | ICD-10-CM | POA: Insufficient documentation

## 2014-02-08 DIAGNOSIS — I10 Essential (primary) hypertension: Secondary | ICD-10-CM | POA: Insufficient documentation

## 2014-02-08 DIAGNOSIS — Z01818 Encounter for other preprocedural examination: Secondary | ICD-10-CM

## 2014-02-08 DIAGNOSIS — I4510 Unspecified right bundle-branch block: Secondary | ICD-10-CM

## 2014-02-08 DIAGNOSIS — M1710 Unilateral primary osteoarthritis, unspecified knee: Secondary | ICD-10-CM

## 2014-02-08 DIAGNOSIS — K219 Gastro-esophageal reflux disease without esophagitis: Secondary | ICD-10-CM

## 2014-02-08 DIAGNOSIS — R000 Tachycardia, unspecified: Secondary | ICD-10-CM | POA: Diagnosis not present

## 2014-02-08 DIAGNOSIS — E782 Mixed hyperlipidemia: Secondary | ICD-10-CM

## 2014-02-08 HISTORY — DX: Carpal tunnel syndrome, unspecified upper limb: G56.00

## 2014-02-08 LAB — COMPREHENSIVE METABOLIC PANEL
ALT: 16 U/L (ref 0–35)
AST: 22 U/L (ref 0–37)
Albumin: 3.9 g/dL (ref 3.5–5.2)
Alkaline Phosphatase: 57 U/L (ref 39–117)
Anion gap: 11 (ref 5–15)
BUN: 6 mg/dL (ref 6–23)
CO2: 25 mEq/L (ref 19–32)
Calcium: 9.6 mg/dL (ref 8.4–10.5)
Chloride: 102 mEq/L (ref 96–112)
Creatinine, Ser: 0.79 mg/dL (ref 0.50–1.10)
GFR calc Af Amer: >90 mL/min (ref >90)
GFR calc non Af Amer: 86 mL/min — ABNORMAL LOW (ref >90)
Glucose, Bld: 105 mg/dL — ABNORMAL HIGH (ref 70–99)
Potassium: 3.9 mEq/L (ref 3.7–5.3)
Sodium: 138 mEq/L (ref 137–147)
Total Bilirubin: <0.2 mg/dL — ABNORMAL LOW (ref 0.3–1.2)
Total Protein: 6.6 g/dL (ref 6.0–8.3)

## 2014-02-08 LAB — CBC WITH DIFFERENTIAL/PLATELET
Basophils Absolute: 0 10*3/uL (ref 0.0–0.1)
Basophils Relative: 0 % (ref 0–1)
Eosinophils Absolute: 0.2 10*3/uL (ref 0.0–0.7)
Eosinophils Relative: 3 % (ref 0–5)
HCT: 35.9 % — ABNORMAL LOW (ref 36.0–46.0)
Hemoglobin: 11.5 g/dL — ABNORMAL LOW (ref 12.0–15.0)
Lymphocytes Relative: 54 % — ABNORMAL HIGH (ref 12–46)
Lymphs Abs: 2.8 10*3/uL (ref 0.7–4.0)
MCH: 27 pg (ref 26.0–34.0)
MCHC: 32 g/dL (ref 30.0–36.0)
MCV: 84.3 fL (ref 78.0–100.0)
Monocytes Absolute: 0.4 10*3/uL (ref 0.1–1.0)
Monocytes Relative: 8 % (ref 3–12)
Neutro Abs: 1.8 10*3/uL (ref 1.7–7.7)
Neutrophils Relative %: 35 % — ABNORMAL LOW (ref 43–77)
Platelets: 252 10*3/uL (ref 150–400)
RBC: 4.26 MIL/uL (ref 3.87–5.11)
RDW: 13.1 % (ref 11.5–15.5)
WBC: 5.2 10*3/uL (ref 4.0–10.5)

## 2014-02-08 LAB — SURGICAL PCR SCREEN
MRSA, PCR: NEGATIVE
Staphylococcus aureus: NEGATIVE

## 2014-02-08 LAB — APTT: aPTT: 36 seconds (ref 24–37)

## 2014-02-08 LAB — PROTIME-INR
INR: 1.06 (ref 0.00–1.49)
Prothrombin Time: 13.8 seconds (ref 11.6–15.2)

## 2014-02-08 LAB — TYPE AND SCREEN
ABO/RH(D): B POS
Antibody Screen: NEGATIVE

## 2014-02-08 MED ORDER — OMEPRAZOLE 40 MG PO CPDR: DELAYED_RELEASE_CAPSULE | ORAL | Status: DC

## 2014-02-08 NOTE — Patient Instructions (Addendum)
  You are cleared for surgery-02/13/14  Your physician wants you to follow-up in 3 months Dr Percival Spanish.  You will receive a reminder letter in the mail two months in advance. If you don't receive a letter, please call our office to schedule the follow-up appointment.

## 2014-02-08 NOTE — Assessment & Plan Note (Signed)
Pt to have Lt TKR 8/17 with Dr Berenice Primas

## 2014-02-08 NOTE — Progress Notes (Signed)
02/08/2014 Hannah [name]   [birth year]  [id number]  Primary Physicia DAUB, Lina Sayre, MD Primary Cardiologist: Dr Percival Spanish   HPI: 63 y/o African American female, previously followed by Dr Rollene Fare, now followed by Dr Percival Spanish. She has a history of dyslipidemia and is now followed at the lipid clinic. She has bilateral moderate carotid stenosis by doppler. Echo 12/12/13 showed Nl LVF with mild to moderate AR and mild MR. She has a chronic RBBB. She has no history of CAD or MI. She has no chest pain or unusual dyspnea. She is here for a follow visit and tells me she is having Lt TKR Monday by Dr Berenice Primas.    Current Outpatient Prescriptions  Medication Sig Dispense Refill  . ALPRAZolam (XANAX) 0.5 MG tablet Take 0.5 mg by mouth at bedtime as needed for sleep.      Marland Kitchen aspirin 81 MG tablet Take 81 mg by mouth daily.      . bisoprolol-hydrochlorothiazide (ZIAC) 5-6.25 MG per tablet Take 1 tablet by mouth daily.  90 tablet  3  . cholecalciferol (VITAMIN D) 1000 UNITS tablet Take 1,000 Units by mouth daily.      Marland Kitchen estradiol (ESTRACE) 1 MG tablet Take 1 mg by mouth daily.      . fenofibrate 160 MG tablet Take 160 mg by mouth daily.      . fexofenadine (ALLEGRA) 180 MG tablet Take 180 mg by mouth daily.      Marland Kitchen HYDROcodone-acetaminophen (NORCO) 5-325 MG per tablet Take 1 tablet by mouth every 6 (six) hours as needed for pain.  30 tablet  0  . omeprazole (PRILOSEC) 40 MG capsule TAKE 2 TABLETS DAILY      . pravastatin (PRAVACHOL) 20 MG tablet Take 1 tablet (20 mg total) by mouth every evening.  30 tablet  6   No current facility-administered medications for this visit.    Allergies  Allergen Reactions  . Amlodipine   . Chantix [Varenicline Tartrate] Other (See Comments)    Tongue swell,sob  . Clarithromycin Rash  . Lisinopril Hives  . Simvastatin Hives  . Wellbutrin [Bupropion Hcl] Hives  . Lipitor [Atorvastatin]     Dizziness per patient    History   Social History  . Marital Status:  Divorced    Spouse Name: N/A    Number of Children: N/A  . Years of Education: N/A   Occupational History  . Not on file.   Social History Main Topics  . Smoking status: Light Tobacco Smoker -- 0.50 packs/day for 40 years    Types: Cigarettes  . Smokeless tobacco: Never Used     Comment: PACK WILL LAST 4 DAYS  . Alcohol Use: No  . Drug Use: No  . Sexual Activity: No   Other Topics Concern  . Not on file   Social History Narrative  . No narrative on file     Review of Systems: General: negative for chills, fever, night sweats or weight changes.  Cardiovascular: negative for chest pain, dyspnea on exertion, edema, orthopnea, palpitations, paroxysmal nocturnal dyspnea or shortness of breath Dermatological: negative for rash Respiratory: negative for cough or wheezing Urologic: negative for hematuria Abdominal: negative for nausea, vomiting, diarrhea, bright red blood per rectum, melena, or hematemesis Neurologic: negative for visual changes, syncope, or dizziness All other systems reviewed and are otherwise negative except as noted above.    Blood pressure 128/70, pulse 59, height 5\' 2"  (1.575 m), weight 130 lb 12.8 oz (59.33 kg).  General  appearance: alert, cooperative and no distress Neck: no JVD and soft Lt carotid bruit Lungs: clear to auscultation bilaterally Heart: regular rate and rhythm and 2/6 systolic murmur Extremities: no edema  EKG NSR, SB, RBBB, LAD, LVH by voltage  ASSESSMENT AND PLAN:   Pre-operative clearance Pt to have Lt TKR 8/17 with Dr Berenice Primas  HTN (hypertension) Controlled  Mixed hyperlipidemia She is now being seen in the lipid clinic  PVD - bilateral 60-79% carotid strenosis No symptoms  RBBB Chronic  DJD (degenerative joint disease) .   PLAN  Discussed with Dr Sallyanne Kuster. She has multiple cardiovascular risk factors. He feels since she is asymptomatic and knee replacement is a relatively low risk  surgery she could have this without  a pre op Myoview. Dr Percival Spanish will see her in follow up in 3 months.  Allie Ousley KPA-C 02/08/2014 8:57 AM

## 2014-02-08 NOTE — Assessment & Plan Note (Signed)
No symptoms 

## 2014-02-08 NOTE — Assessment & Plan Note (Signed)
Controlled.  

## 2014-02-08 NOTE — Assessment & Plan Note (Signed)
Chronic. 

## 2014-02-08 NOTE — Progress Notes (Signed)
Urine specimen spilled out..will need a new sample the dos.  DA

## 2014-02-08 NOTE — Assessment & Plan Note (Signed)
She is now being seen in the lipid clinic

## 2014-02-09 ENCOUNTER — Other Ambulatory Visit: Payer: Self-pay | Admitting: *Deleted

## 2014-02-09 ENCOUNTER — Other Ambulatory Visit (HOSPITAL_COMMUNITY): Payer: Commercial Managed Care - HMO

## 2014-02-09 MED ORDER — OMEPRAZOLE 40 MG PO CPDR: 80.0000 mg | DELAYED_RELEASE_CAPSULE | Freq: Every day | ORAL | Status: DC

## 2014-02-12 MED ORDER — CEFAZOLIN SODIUM-DEXTROSE 2-3 GM-% IV SOLR
2.0000 g | INTRAVENOUS | Status: AC
  Administered 2014-02-13: 2 g via INTRAVENOUS

## 2014-02-12 MED ORDER — CHLORHEXIDINE GLUCONATE 4 % EX LIQD
60.0000 mL | Freq: Once | CUTANEOUS | Status: DC
  Filled 2014-02-12: qty 60

## 2014-02-13 ENCOUNTER — Inpatient Hospital Stay (HOSPITAL_COMMUNITY)
Admission: RE | Admit: 2014-02-13 | Discharge: 2014-02-15 | DRG: 470 | Disposition: A | Discharge status: Home Health | Payer: Medicare HMO | Source: Ambulatory Visit | Attending: Orthopedic Surgery | Admitting: Orthopedic Surgery

## 2014-02-13 ENCOUNTER — Encounter (HOSPITAL_COMMUNITY): Payer: Medicare HMO | Admitting: Vascular Surgery

## 2014-02-13 ENCOUNTER — Inpatient Hospital Stay (HOSPITAL_COMMUNITY): Payer: Medicare HMO | Admitting: Certified Registered Nurse Anesthetist

## 2014-02-13 ENCOUNTER — Encounter (HOSPITAL_COMMUNITY)
Admission: RE | Disposition: A | Discharge status: Home Health | Payer: Self-pay | Source: Ambulatory Visit | Attending: Orthopedic Surgery

## 2014-02-13 ENCOUNTER — Encounter (HOSPITAL_COMMUNITY): Payer: Self-pay | Admitting: Certified Registered Nurse Anesthetist

## 2014-02-13 DIAGNOSIS — G8918 Other acute postprocedural pain: Secondary | ICD-10-CM | POA: Diagnosis not present

## 2014-02-13 DIAGNOSIS — Z96659 Presence of unspecified artificial knee joint: Secondary | ICD-10-CM | POA: Insufficient documentation

## 2014-02-13 DIAGNOSIS — M1710 Unilateral primary osteoarthritis, unspecified knee: Principal | ICD-10-CM | POA: Diagnosis present

## 2014-02-13 DIAGNOSIS — F17200 Nicotine dependence, unspecified, uncomplicated: Secondary | ICD-10-CM | POA: Diagnosis present

## 2014-02-13 DIAGNOSIS — G5600 Carpal tunnel syndrome, unspecified upper limb: Secondary | ICD-10-CM | POA: Diagnosis present

## 2014-02-13 DIAGNOSIS — F329 Major depressive disorder, single episode, unspecified: Secondary | ICD-10-CM | POA: Diagnosis present

## 2014-02-13 DIAGNOSIS — I739 Peripheral vascular disease, unspecified: Secondary | ICD-10-CM | POA: Diagnosis present

## 2014-02-13 DIAGNOSIS — Z9851 Tubal ligation status: Secondary | ICD-10-CM | POA: Diagnosis not present

## 2014-02-13 DIAGNOSIS — K5900 Constipation, unspecified: Secondary | ICD-10-CM | POA: Diagnosis present

## 2014-02-13 DIAGNOSIS — F411 Generalized anxiety disorder: Secondary | ICD-10-CM | POA: Diagnosis present

## 2014-02-13 DIAGNOSIS — Z7982 Long term (current) use of aspirin: Secondary | ICD-10-CM

## 2014-02-13 DIAGNOSIS — I658 Occlusion and stenosis of other precerebral arteries: Secondary | ICD-10-CM | POA: Diagnosis present

## 2014-02-13 DIAGNOSIS — Z833 Family history of diabetes mellitus: Secondary | ICD-10-CM | POA: Diagnosis not present

## 2014-02-13 DIAGNOSIS — I6529 Occlusion and stenosis of unspecified carotid artery: Secondary | ICD-10-CM | POA: Diagnosis not present

## 2014-02-13 DIAGNOSIS — F3289 Other specified depressive episodes: Secondary | ICD-10-CM | POA: Diagnosis present

## 2014-02-13 DIAGNOSIS — I4510 Unspecified right bundle-branch block: Secondary | ICD-10-CM | POA: Diagnosis present

## 2014-02-13 DIAGNOSIS — K219 Gastro-esophageal reflux disease without esophagitis: Secondary | ICD-10-CM | POA: Diagnosis present

## 2014-02-13 DIAGNOSIS — E782 Mixed hyperlipidemia: Secondary | ICD-10-CM | POA: Diagnosis present

## 2014-02-13 DIAGNOSIS — Z79899 Other long term (current) drug therapy: Secondary | ICD-10-CM

## 2014-02-13 DIAGNOSIS — M25569 Pain in unspecified knee: Secondary | ICD-10-CM | POA: Diagnosis present

## 2014-02-13 DIAGNOSIS — M1712 Unilateral primary osteoarthritis, left knee: Secondary | ICD-10-CM | POA: Diagnosis present

## 2014-02-13 DIAGNOSIS — I10 Essential (primary) hypertension: Secondary | ICD-10-CM | POA: Diagnosis present

## 2014-02-13 DIAGNOSIS — Z823 Family history of stroke: Secondary | ICD-10-CM

## 2014-02-13 DIAGNOSIS — Z888 Allergy status to other drugs, medicaments and biological substances status: Secondary | ICD-10-CM | POA: Diagnosis not present

## 2014-02-13 HISTORY — PX: TOTAL KNEE ARTHROPLASTY: SHX125

## 2014-02-13 SURGERY — ARTHROPLASTY, KNEE, TOTAL
Anesthesia: General | Laterality: Left

## 2014-02-13 MED ORDER — METOCLOPRAMIDE HCL 5 MG PO TABS
5.0000 mg | ORAL_TABLET | Freq: Three times a day (TID) | ORAL | Status: DC | PRN
  Filled 2014-02-13: qty 2

## 2014-02-13 MED ORDER — OXYCODONE HCL 5 MG PO TABS
ORAL_TABLET | ORAL | Status: AC
  Filled 2014-02-13: qty 1

## 2014-02-13 MED ORDER — MIDAZOLAM HCL 5 MG/5ML IJ SOLN
INTRAMUSCULAR | Status: DC | PRN
  Administered 2014-02-13: 1 mg via INTRAVENOUS

## 2014-02-13 MED ORDER — SODIUM CHLORIDE 0.9 % IJ SOLN
INTRAMUSCULAR | Status: DC | PRN
  Administered 2014-02-13 (×4): 10 mL

## 2014-02-13 MED ORDER — MIDAZOLAM HCL 2 MG/2ML IJ SOLN
INTRAMUSCULAR | Status: AC
  Administered 2014-02-13: 1.5 mg
  Filled 2014-02-13: qty 2

## 2014-02-13 MED ORDER — ONDANSETRON HCL 4 MG/2ML IJ SOLN: 4.0000 mg | Freq: Four times a day (QID) | INTRAMUSCULAR | Status: DC | PRN

## 2014-02-13 MED ORDER — BISACODYL 10 MG RE SUPP: 10.0000 mg | Freq: Every day | RECTAL | Status: DC | PRN

## 2014-02-13 MED ORDER — SENNOSIDES-DOCUSATE SODIUM 8.6-50 MG PO TABS: 1.0000 | ORAL_TABLET | Freq: Every evening | ORAL | Status: DC | PRN

## 2014-02-13 MED ORDER — METHOCARBAMOL 500 MG PO TABS
500.0000 mg | ORAL_TABLET | Freq: Four times a day (QID) | ORAL | Status: DC | PRN
  Administered 2014-02-13 – 2014-02-15 (×6): 500 mg via ORAL
  Filled 2014-02-13 (×5): qty 1

## 2014-02-13 MED ORDER — HYDROMORPHONE HCL PF 1 MG/ML IJ SOLN
0.2500 mg | INTRAMUSCULAR | Status: DC | PRN
  Administered 2014-02-13 (×3): 0.5 mg via INTRAVENOUS

## 2014-02-13 MED ORDER — FENTANYL CITRATE 0.05 MG/ML IJ SOLN
INTRAMUSCULAR | Status: AC
  Administered 2014-02-13: 75 ug
  Filled 2014-02-13: qty 2

## 2014-02-13 MED ORDER — MENTHOL 3 MG MT LOZG: 1.0000 | LOZENGE | OROMUCOSAL | Status: DC | PRN

## 2014-02-13 MED ORDER — ARTIFICIAL TEARS OP OINT
TOPICAL_OINTMENT | OPHTHALMIC | Status: AC
  Filled 2014-02-13: qty 3.5

## 2014-02-13 MED ORDER — ONDANSETRON HCL 4 MG PO TABS: 4.0000 mg | ORAL_TABLET | Freq: Four times a day (QID) | ORAL | Status: DC | PRN

## 2014-02-13 MED ORDER — DOCUSATE SODIUM 100 MG PO CAPS
100.0000 mg | ORAL_CAPSULE | Freq: Two times a day (BID) | ORAL | Status: DC
  Administered 2014-02-13 – 2014-02-15 (×4): 100 mg via ORAL
  Filled 2014-02-13 (×4): qty 1

## 2014-02-13 MED ORDER — ASPIRIN EC 325 MG PO TBEC
325.0000 mg | DELAYED_RELEASE_TABLET | Freq: Two times a day (BID) | ORAL | Status: DC
  Administered 2014-02-14 – 2014-02-15 (×3): 325 mg via ORAL
  Filled 2014-02-13 (×5): qty 1

## 2014-02-13 MED ORDER — FLEET ENEMA 7-19 GM/118ML RE ENEM: 1.0000 | ENEMA | Freq: Once | RECTAL | Status: AC | PRN

## 2014-02-13 MED ORDER — HYDROMORPHONE HCL PF 1 MG/ML IJ SOLN: 1.0000 mg | INTRAMUSCULAR | Status: DC | PRN

## 2014-02-13 MED ORDER — BUPIVACAINE LIPOSOME 1.3 % IJ SUSP
20.0000 mL | Freq: Once | INTRAMUSCULAR | Status: AC
  Administered 2014-02-13: 20 mL
  Filled 2014-02-13: qty 20

## 2014-02-13 MED ORDER — LIDOCAINE HCL 1 % IJ SOLN
INTRAMUSCULAR | Status: DC | PRN
  Administered 2014-02-13: 70 mg via INTRADERMAL

## 2014-02-13 MED ORDER — EPHEDRINE SULFATE 50 MG/ML IJ SOLN
INTRAMUSCULAR | Status: DC | PRN
  Administered 2014-02-13: 10 mg via INTRAVENOUS
  Administered 2014-02-13 (×2): 5 mg via INTRAVENOUS

## 2014-02-13 MED ORDER — METHOCARBAMOL 500 MG PO TABS
ORAL_TABLET | ORAL | Status: AC
  Filled 2014-02-13: qty 1

## 2014-02-13 MED ORDER — MIDAZOLAM HCL 2 MG/2ML IJ SOLN
INTRAMUSCULAR | Status: AC
  Filled 2014-02-13: qty 2

## 2014-02-13 MED ORDER — OXYCODONE HCL 5 MG PO TABS
5.0000 mg | ORAL_TABLET | ORAL | Status: DC | PRN
  Administered 2014-02-13 – 2014-02-15 (×10): 10 mg via ORAL
  Filled 2014-02-13 (×10): qty 2

## 2014-02-13 MED ORDER — PHENYLEPHRINE 40 MCG/ML (10ML) SYRINGE FOR IV PUSH (FOR BLOOD PRESSURE SUPPORT)
PREFILLED_SYRINGE | INTRAVENOUS | Status: AC
  Filled 2014-02-13: qty 10

## 2014-02-13 MED ORDER — LACTATED RINGERS IV SOLN
INTRAVENOUS | Status: DC | PRN
  Administered 2014-02-13 (×2): via INTRAVENOUS

## 2014-02-13 MED ORDER — ASPIRIN EC 325 MG PO TBEC
325.0000 mg | DELAYED_RELEASE_TABLET | Freq: Every day | ORAL | Status: DC
  Filled 2014-02-13: qty 1

## 2014-02-13 MED ORDER — HYDROMORPHONE HCL PF 1 MG/ML IJ SOLN
INTRAMUSCULAR | Status: AC
  Filled 2014-02-13: qty 1

## 2014-02-13 MED ORDER — BUPIVACAINE-EPINEPHRINE (PF) 0.5% -1:200000 IJ SOLN
INTRAMUSCULAR | Status: DC | PRN
  Administered 2014-02-13: 25 mL via PERINEURAL

## 2014-02-13 MED ORDER — ACETAMINOPHEN 650 MG RE SUPP: 650.0000 mg | Freq: Four times a day (QID) | RECTAL | Status: DC | PRN

## 2014-02-13 MED ORDER — LIDOCAINE HCL (CARDIAC) 20 MG/ML IV SOLN
INTRAVENOUS | Status: AC
  Filled 2014-02-13: qty 5

## 2014-02-13 MED ORDER — PROMETHAZINE HCL 25 MG/ML IJ SOLN: 6.2500 mg | INTRAMUSCULAR | Status: DC | PRN

## 2014-02-13 MED ORDER — PROPOFOL 10 MG/ML IV BOLUS
INTRAVENOUS | Status: AC
  Filled 2014-02-13: qty 20

## 2014-02-13 MED ORDER — LACTATED RINGERS IV SOLN
INTRAVENOUS | Status: DC
  Administered 2014-02-13: 12:00:00 via INTRAVENOUS

## 2014-02-13 MED ORDER — PHENOL 1.4 % MT LIQD: 1.0000 | OROMUCOSAL | Status: DC | PRN

## 2014-02-13 MED ORDER — TRANEXAMIC ACID 100 MG/ML IV SOLN
1000.0000 mg | INTRAVENOUS | Status: DC | PRN
  Administered 2014-02-13: 1000 mg via INTRAVENOUS

## 2014-02-13 MED ORDER — ZOLPIDEM TARTRATE 5 MG PO TABS: 5.0000 mg | ORAL_TABLET | Freq: Every evening | ORAL | Status: DC | PRN

## 2014-02-13 MED ORDER — OXYCODONE HCL 5 MG/5ML PO SOLN: 5.0000 mg | Freq: Once | ORAL | Status: AC | PRN

## 2014-02-13 MED ORDER — ONDANSETRON HCL 4 MG/2ML IJ SOLN
INTRAMUSCULAR | Status: AC
  Filled 2014-02-13: qty 2

## 2014-02-13 MED ORDER — ALUM & MAG HYDROXIDE-SIMETH 200-200-20 MG/5ML PO SUSP
30.0000 mL | ORAL | Status: DC | PRN
  Administered 2014-02-13: 30 mL via ORAL
  Filled 2014-02-13: qty 30

## 2014-02-13 MED ORDER — FENTANYL CITRATE 0.05 MG/ML IJ SOLN
INTRAMUSCULAR | Status: AC
  Filled 2014-02-13: qty 5

## 2014-02-13 MED ORDER — TRANEXAMIC ACID 100 MG/ML IV SOLN
1000.0000 mg | INTRAVENOUS | Status: DC
  Filled 2014-02-13: qty 10

## 2014-02-13 MED ORDER — METHOCARBAMOL 1000 MG/10ML IJ SOLN: 500.0000 mg | Freq: Four times a day (QID) | INTRAVENOUS | Status: DC | PRN

## 2014-02-13 MED ORDER — 0.9 % SODIUM CHLORIDE (POUR BTL) OPTIME
TOPICAL | Status: DC | PRN
  Administered 2014-02-13: 1000 mL

## 2014-02-13 MED ORDER — ARTIFICIAL TEARS OP OINT
TOPICAL_OINTMENT | OPHTHALMIC | Status: DC | PRN
  Administered 2014-02-13: 1 via OPHTHALMIC

## 2014-02-13 MED ORDER — FENTANYL CITRATE 0.05 MG/ML IJ SOLN
INTRAMUSCULAR | Status: DC | PRN
  Administered 2014-02-13 (×3): 25 ug via INTRAVENOUS
  Administered 2014-02-13 (×2): 50 ug via INTRAVENOUS
  Administered 2014-02-13: 25 ug via INTRAVENOUS

## 2014-02-13 MED ORDER — METOCLOPRAMIDE HCL 5 MG/ML IJ SOLN: 5.0000 mg | Freq: Three times a day (TID) | INTRAMUSCULAR | Status: DC | PRN

## 2014-02-13 MED ORDER — PROPOFOL 10 MG/ML IV BOLUS
INTRAVENOUS | Status: DC | PRN
  Administered 2014-02-13: 100 mg via INTRAVENOUS

## 2014-02-13 MED ORDER — DIPHENHYDRAMINE HCL 12.5 MG/5ML PO ELIX: 12.5000 mg | ORAL_SOLUTION | ORAL | Status: DC | PRN

## 2014-02-13 MED ORDER — ACETAMINOPHEN 500 MG PO TABS
1000.0000 mg | ORAL_TABLET | Freq: Four times a day (QID) | ORAL | Status: AC
  Administered 2014-02-13 – 2014-02-14 (×2): 1000 mg via ORAL
  Filled 2014-02-13 (×2): qty 2

## 2014-02-13 MED ORDER — SODIUM CHLORIDE 0.9 % IR SOLN
Status: DC | PRN
  Administered 2014-02-13: 3000 mL

## 2014-02-13 MED ORDER — OXYCODONE HCL 5 MG PO TABS
5.0000 mg | ORAL_TABLET | Freq: Once | ORAL | Status: AC | PRN
  Administered 2014-02-13: 5 mg via ORAL

## 2014-02-13 MED ORDER — SODIUM CHLORIDE 0.45 % IV SOLN
INTRAVENOUS | Status: DC
  Administered 2014-02-13: 18:00:00 via INTRAVENOUS

## 2014-02-13 MED ORDER — ACETAMINOPHEN 325 MG PO TABS: 650.0000 mg | ORAL_TABLET | Freq: Four times a day (QID) | ORAL | Status: DC | PRN

## 2014-02-13 MED ORDER — ONDANSETRON HCL 4 MG/2ML IJ SOLN
INTRAMUSCULAR | Status: DC | PRN
  Administered 2014-02-13: 4 mg via INTRAVENOUS

## 2014-02-13 MED ORDER — FERROUS SULFATE 325 (65 FE) MG PO TABS
325.0000 mg | ORAL_TABLET | Freq: Three times a day (TID) | ORAL | Status: DC
  Filled 2014-02-13 (×2): qty 1

## 2014-02-13 SURGICAL SUPPLY — 60 items
16" UNIV BASIC KNEE SPLINT ×2 IMPLANT
BANDAGE ESMARK 6X9 LF (GAUZE/BANDAGES/DRESSINGS) ×1 IMPLANT
BENZOIN TINCTURE PRP APPL 2/3 (GAUZE/BANDAGES/DRESSINGS) ×2 IMPLANT
BLADE SAGITTAL 25.0X1.19X90 (BLADE) ×2 IMPLANT
BLADE SAW SAG 90X13X1.27 (BLADE) ×2 IMPLANT
BNDG ESMARK 6X9 LF (GAUZE/BANDAGES/DRESSINGS) ×2
BOWL SMART MIX CTS (DISPOSABLE) ×2 IMPLANT
CAPT RP KNEE ×2 IMPLANT
CEMENT HV SMART SET (Cement) ×4 IMPLANT
CLSR STERI-STRIP ANTIMIC 1/2X4 (GAUZE/BANDAGES/DRESSINGS) ×2 IMPLANT
COVER SURGICAL LIGHT HANDLE (MISCELLANEOUS) ×2 IMPLANT
CUFF TOURNIQUET SINGLE 24IN (TOURNIQUET CUFF) ×2 IMPLANT
CUFF TOURNIQUET SINGLE 34IN LL (TOURNIQUET CUFF) IMPLANT
CUFF TOURNIQUET SINGLE 44IN (TOURNIQUET CUFF) IMPLANT
DRAPE EXTREMITY T 121X128X90 (DRAPE) ×2 IMPLANT
DRAPE U-SHAPE 47X51 STRL (DRAPES) ×2 IMPLANT
DRSG PAD ABDOMINAL 8X10 ST (GAUZE/BANDAGES/DRESSINGS) ×2 IMPLANT
DURAPREP 26ML APPLICATOR (WOUND CARE) ×4 IMPLANT
ELECT REM PT RETURN 9FT ADLT (ELECTROSURGICAL) ×2
ELECTRODE REM PT RTRN 9FT ADLT (ELECTROSURGICAL) ×1 IMPLANT
EVACUATOR 1/8 PVC DRAIN (DRAIN) ×2 IMPLANT
FACESHIELD WRAPAROUND (MASK) ×4 IMPLANT
GAUZE SPONGE 4X4 12PLY STRL (GAUZE/BANDAGES/DRESSINGS) ×2 IMPLANT
GAUZE XEROFORM 5X9 LF (GAUZE/BANDAGES/DRESSINGS) ×2 IMPLANT
GLOVE BIOGEL PI IND STRL 8 (GLOVE) ×2 IMPLANT
GLOVE BIOGEL PI INDICATOR 8 (GLOVE) ×2
GLOVE ECLIPSE 7.5 STRL STRAW (GLOVE) ×4 IMPLANT
GOWN STRL REUS W/ TWL LRG LVL3 (GOWN DISPOSABLE) ×2 IMPLANT
GOWN STRL REUS W/ TWL XL LVL3 (GOWN DISPOSABLE) ×2 IMPLANT
GOWN STRL REUS W/TWL LRG LVL3 (GOWN DISPOSABLE) ×2
GOWN STRL REUS W/TWL XL LVL3 (GOWN DISPOSABLE) ×2
HANDPIECE INTERPULSE COAX TIP (DISPOSABLE) ×1
HOOD PEEL AWAY FACE SHEILD DIS (HOOD) ×4 IMPLANT
IMMOBILIZER KNEE 20 (SOFTGOODS) ×2 IMPLANT
IMMOBILIZER KNEE 22 UNIV (SOFTGOODS) IMPLANT
KIT BASIN OR (CUSTOM PROCEDURE TRAY) ×2 IMPLANT
KIT ROOM TURNOVER OR (KITS) ×2 IMPLANT
MANIFOLD NEPTUNE II (INSTRUMENTS) ×2 IMPLANT
NEEDLE HYPO 25GX1X1/2 BEV (NEEDLE) ×2 IMPLANT
NS IRRIG 1000ML POUR BTL (IV SOLUTION) ×2 IMPLANT
PACK TOTAL JOINT (CUSTOM PROCEDURE TRAY) ×2 IMPLANT
PAD ARMBOARD 7.5X6 YLW CONV (MISCELLANEOUS) ×2 IMPLANT
PAD CAST 4YDX4 CTTN HI CHSV (CAST SUPPLIES) ×1 IMPLANT
PADDING CAST COTTON 4X4 STRL (CAST SUPPLIES) ×1
SET HNDPC FAN SPRY TIP SCT (DISPOSABLE) ×1 IMPLANT
SPONGE GAUZE 4X4 12PLY STER LF (GAUZE/BANDAGES/DRESSINGS) ×2 IMPLANT
STAPLER VISISTAT 35W (STAPLE) ×2 IMPLANT
STRIP CLOSURE SKIN 1/2X4 (GAUZE/BANDAGES/DRESSINGS) ×2 IMPLANT
SUCTION FRAZIER TIP 10 FR DISP (SUCTIONS) ×2 IMPLANT
SUT MNCRL AB 3-0 PS2 18 (SUTURE) ×2 IMPLANT
SUT VIC AB 0 CTB1 27 (SUTURE) ×4 IMPLANT
SUT VIC AB 1 CT1 27 (SUTURE) ×2
SUT VIC AB 1 CT1 27XBRD ANBCTR (SUTURE) ×2 IMPLANT
SUT VIC AB 2-0 CTB1 (SUTURE) ×4 IMPLANT
SYR 50ML LL SCALE MARK (SYRINGE) ×2 IMPLANT
SYR CONTROL 10ML LL (SYRINGE) IMPLANT
TOWEL OR 17X24 6PK STRL BLUE (TOWEL DISPOSABLE) ×2 IMPLANT
TOWEL OR 17X26 10 PK STRL BLUE (TOWEL DISPOSABLE) ×2 IMPLANT
TRAY FOLEY CATH 16FRSI W/METER (SET/KITS/TRAYS/PACK) IMPLANT
WATER STERILE IRR 1000ML POUR (IV SOLUTION) IMPLANT

## 2014-02-13 NOTE — Progress Notes (Signed)
Orthopedic Tech Progress Note Patient Details:  Hannah [name] [birth year] [id number]  CPM Left Knee CPM Left Knee: On Left Knee Flexion (Degrees): 60 Left Knee Extension (Degrees): 0 Additional Comments: Trapeze bar and foot roll   [name] 02/13/2014, 4:03 PM

## 2014-02-13 NOTE — Progress Notes (Signed)
Orthopedic Tech Progress Note Patient Details:  Hannah [name] 07/05/1950 [id number] Ordered by Dr. Berenice Primas Ortho Devices Type of Ortho Device: Knee Immobilizer Ortho Device/Splint Interventions: Ordered   Braulio Bosch 02/13/2014, 3:05 PM

## 2014-02-13 NOTE — H&P (Signed)
TOTAL KNEE ADMISSION H&P  Patient is being admitted for left total knee arthroplasty.  Subjective:  Chief Complaint:left knee pain.  HPI: Hannah [name], 63 y.o. female, has a history of pain and functional disability in the left knee due to arthritis and has failed non-surgical conservative treatments for greater than 12 weeks to includeNSAID's and/or analgesics, corticosteriod injections, viscosupplementation injections, use of assistive devices, weight reduction as appropriate and activity modification.  Onset of symptoms was gradual, starting 5 years ago with gradually worsening course since that time. The patient noted no past surgery on the left knee(s).  Patient currently rates pain in the left knee(s) at 8 out of 10 with activity. Patient has night pain, worsening of pain with activity and weight bearing, pain that interferes with activities of daily living, pain with passive range of motion, crepitus and joint swelling.  Patient has evidence of subchondral sclerosis and joint space narrowing by imaging studies. This patient has had failure of all reasonable conservative care. There is no active infection.  Patient Active Problem List   Diagnosis Date Noted  . RBBB 02/08/2014  . HTN (hypertension) 02/08/2014  . PVD - bilateral 60-79% carotid strenosis 02/08/2014  . Pre-operative clearance 02/08/2014  . Mixed hyperlipidemia 11/29/2013  . Carpal tunnel syndrome, bilateral 11/23/2013  . DJD (degenerative joint disease) 11/23/2013  . Murmur- mild -mod AR, mild MR 11/10/2013  . Acne 08/26/2013  . High risk HPV infection 12/16/2012  . Chronic constipation 12/16/2012  . Arthritis 10/08/2012  . Unspecified episodic mood disorder 10/08/2012  . Nicotine abuse 12/29/2011   Past Medical History  Diagnosis Date  . Anxiety   . Depression   . Arthritis   . Hypertension   . Allergy   . Heart murmur     mild-moderate AR  . PVD (peripheral vascular disease)     moderate carotid disease   . Dyslipidemia   . RBBB   . Smoker   . Carpal tunnel syndrome     Past Surgical History  Procedure Laterality Date  . Spine surgery  08/16/2009  . Abdominal hysterectomy  1994  . Tubal ligation    . Colonoscopy w/ polypectomy  2009  . Hemorrhoid surgery      Prescriptions prior to admission  Medication Sig Dispense Refill  . ALPRAZolam (XANAX) 0.5 MG tablet Take 0.5 mg by mouth at bedtime as needed for sleep.      Marland Kitchen aspirin 81 MG tablet Take 81 mg by mouth daily.      . bisoprolol-hydrochlorothiazide (ZIAC) 5-6.25 MG per tablet Take 1 tablet by mouth daily.  90 tablet  3  . cholecalciferol (VITAMIN D) 1000 UNITS tablet Take 1,000 Units by mouth daily.      Marland Kitchen estradiol (ESTRACE) 1 MG tablet Take 1 mg by mouth daily.      . fenofibrate 160 MG tablet Take 160 mg by mouth daily.      . fexofenadine (ALLEGRA) 180 MG tablet Take 180 mg by mouth daily.      Marland Kitchen HYDROcodone-acetaminophen (NORCO) 5-325 MG per tablet Take 1 tablet by mouth every 6 (six) hours as needed for pain.  30 tablet  0  . omeprazole (PRILOSEC) 40 MG capsule Take 2 capsules (80 mg total) by mouth daily.  60 capsule  6  . pravastatin (PRAVACHOL) 20 MG tablet Take 1 tablet (20 mg total) by mouth every evening.  30 tablet  6   Allergies  Allergen Reactions  . Amlodipine   . Chantix [Varenicline  Tartrate] Other (See Comments)    Tongue swell,sob  . Clarithromycin Rash  . Lisinopril Hives  . Simvastatin Hives  . Wellbutrin [Bupropion Hcl] Hives  . Lipitor [Atorvastatin]     Dizziness per patient    History  Substance Use Topics  . Smoking status: Light Tobacco Smoker -- 0.50 packs/day for 40 years    Types: Cigarettes  . Smokeless tobacco: Never Used     Comment: PACK WILL LAST 4 DAYS  . Alcohol Use: No    Family History  Problem Relation Age of Onset  . Cancer Father   . Diabetes Sister   . Stroke Maternal Grandfather      ROS ROS: I have reviewed the patient's review of systems thoroughly and there are  no positive responses as relates to the HPI.  Objective:  Physical Exam  Vital signs in last 24 hours: Temp:  [98.5 F (36.9 C)] 98.5 F (36.9 C) (08/17 1033) Pulse Rate:  [53-74] 69 (08/17 1210) Resp:  [15-19] 19 (08/17 1210) BP: (120-145)/(56-72) 120/56 mmHg (08/17 1210) SpO2:  [95 %-100 %] 99 % (08/17 1210) Well-developed well-nourished patient in no acute distress. Alert and oriented x3 HEENT:within normal limits Cardiac: Regular rate and rhythm Pulmonary: Lungs clear to auscultation Abdomen: Soft and nontender.  Normal active bowel sounds  Musculoskeletal: (Left knee: Significant pain to range of motion.  Is no instability.  There is grinding through range of motion.  There is trace effusion. Labs: Recent Results (from the past 2160 hour(s))  LIPID PANEL     Status: Abnormal   Collection Time    01/30/14  8:48 AM      Result Value Ref Range   Cholesterol 196  0 - 200 mg/dL   Comment: ATP III Classification       Desirable:  < 200 mg/dL               Borderline High:  200 - 239 mg/dL          High:  > = 240 mg/dL   Triglycerides 271.0 (*) 0.0 - 149.0 mg/dL   Comment: Normal:  <150 mg/dLBorderline High:  150 - 199 mg/dL   HDL 35.30 (*) >39.00 mg/dL   VLDL 54.2 (*) 0.0 - 40.0 mg/dL   Total CHOL/HDL Ratio 6     Comment:                Men          Women1/2 Average Risk     3.4          3.3Average Risk          5.0          4.42X Average Risk          9.6          7.13X Average Risk          15.0          11.0                       NonHDL 160.70     Comment: NOTE:  Non-HDL goal should be 30 mg/dL higher than patient's LDL goal (i.e. LDL goal of < 70 mg/dL, would have non-HDL goal of < 100 mg/dL)  HEPATIC FUNCTION PANEL     Status: None   Collection Time    01/30/14  8:48 AM      Result Value Ref Range   Total Bilirubin 0.5  0.2 - 1.2 mg/dL   Bilirubin, Direct 0.0  0.0 - 0.3 mg/dL   Alkaline Phosphatase 41  39 - 117 U/L   AST 28  0 - 37 U/L   ALT 19  0 - 35 U/L   Total  Protein 7.0  6.0 - 8.3 g/dL   Albumin 4.2  3.5 - 5.2 g/dL  LDL CHOLESTEROL, DIRECT     Status: None   Collection Time    01/30/14  8:48 AM      Result Value Ref Range   Direct LDL 115.5     Comment: Optimal:  <100 mg/dLNear or Above Optimal:  100-129 mg/dLBorderline High:  130-159 mg/dLHigh:  160-189 mg/dLVery High:  >190 mg/dL  SURGICAL PCR SCREEN     Status: None   Collection Time    02/08/14 11:48 AM      Result Value Ref Range   MRSA, PCR NEGATIVE  NEGATIVE   Staphylococcus aureus NEGATIVE  NEGATIVE   Comment:            The Xpert SA Assay (FDA     approved for NASAL specimens     in patients over 25 years of age),     is one component of     a comprehensive surveillance     program.  Test performance has     been validated by [employer] for patients greater     than or equal to 64 year old.     It is not intended     to diagnose infection nor to     guide or monitor treatment.  APTT     Status: None   Collection Time    02/08/14 11:48 AM      Result Value Ref Range   aPTT 36  24 - 37 seconds  CBC WITH DIFFERENTIAL     Status: Abnormal   Collection Time    02/08/14 11:48 AM      Result Value Ref Range   WBC 5.2  4.0 - 10.5 K/uL   RBC 4.26  3.87 - 5.11 MIL/uL   Hemoglobin 11.5 (*) 12.0 - 15.0 g/dL   HCT 35.9 (*) 36.0 - 46.0 %   MCV 84.3  78.0 - 100.0 fL   MCH 27.0  26.0 - 34.0 pg   MCHC 32.0  30.0 - 36.0 g/dL   RDW 13.1  11.5 - 15.5 %   Platelets 252  150 - 400 K/uL   Neutrophils Relative % 35 (*) 43 - 77 %   Neutro Abs 1.8  1.7 - 7.7 K/uL   Lymphocytes Relative 54 (*) 12 - 46 %   Lymphs Abs 2.8  0.7 - 4.0 K/uL   Monocytes Relative 8  3 - 12 %   Monocytes Absolute 0.4  0.1 - 1.0 K/uL   Eosinophils Relative 3  0 - 5 %   Eosinophils Absolute 0.2  0.0 - 0.7 K/uL   Basophils Relative 0  0 - 1 %   Basophils Absolute 0.0  0.0 - 0.1 K/uL  COMPREHENSIVE METABOLIC PANEL     Status: Abnormal   Collection Time    02/08/14 11:48 AM      Result Value Ref Range    Sodium 138  137 - 147 mEq/L   Potassium 3.9  3.7 - 5.3 mEq/L   Chloride 102  96 - 112 mEq/L   CO2 25  19 - 32 mEq/L   Glucose, Bld 105 (*) 70 -  99 mg/dL   BUN 6  6 - 23 mg/dL   Creatinine, Ser 0.79  0.50 - 1.10 mg/dL   Calcium 9.6  8.4 - 10.5 mg/dL   Total Protein 6.6  6.0 - 8.3 g/dL   Albumin 3.9  3.5 - 5.2 g/dL   AST 22  0 - 37 U/L   ALT 16  0 - 35 U/L   Alkaline Phosphatase 57  39 - 117 U/L   Total Bilirubin <0.2 (*) 0.3 - 1.2 mg/dL   GFR calc non Af Amer 86 (*) >90 mL/min   GFR calc Af Amer >90  >90 mL/min   Comment: (NOTE)     The eGFR has been calculated using the CKD EPI equation.     This calculation has not been validated in all clinical situations.     eGFR's persistently <90 mL/min signify possible Chronic Kidney     Disease.   Anion gap 11  5 - 15  PROTIME-INR     Status: None   Collection Time    02/08/14 11:48 AM      Result Value Ref Range   Prothrombin Time 13.8  11.6 - 15.2 seconds   INR 1.06  0.00 - 1.49  TYPE AND SCREEN     Status: None   Collection Time    02/08/14 11:50 AM      Result Value Ref Range   ABO/RH(D) B POS     Antibody Screen NEG     Sample Expiration 02/22/2014      Estimated body mass index is 23.55 kg/(m^2) as calculated from the following:   Height as of 02/08/14: $RemoveBef'5\' 2"'MwKuGeEmEl$  (1.575 m).   Weight as of 12/19/13: 128 lb 12.8 oz (58.423 kg).   Imaging Review Plain radiographs demonstrate severe degenerative joint disease of the left knee(s). The overall alignment ismild varus. The bone quality appears to be good for age and reported activity level.  Assessment/Plan:  End stage arthritis, left knee   The patient history, physical examination, clinical judgment of the provider and imaging studies are consistent with end stage degenerative joint disease of the left knee(s) and total knee arthroplasty is deemed medically necessary. The treatment options including medical management, injection therapy arthroscopy and arthroplasty were discussed at  length. The risks and benefits of total knee arthroplasty were presented and reviewed. The risks due to aseptic loosening, infection, stiffness, patella tracking problems, thromboembolic complications and other imponderables were discussed. The patient acknowledged the explanation, agreed to proceed with the plan and consent was signed. Patient is being admitted for inpatient treatment for surgery, pain control, PT, OT, prophylactic antibiotics, VTE prophylaxis, progressive ambulation and ADL's and discharge planning. The patient is planning to be discharged home with home health services

## 2014-02-13 NOTE — Anesthesia Preprocedure Evaluation (Addendum)
Anesthesia Evaluation  Patient identified by MRN, date of birth, ID band Patient awake    Reviewed: Allergy & Precautions, H&P , NPO status , Patient's Chart, lab work & pertinent test results  History of Anesthesia Complications Negative for: history of anesthetic complications  Airway Mallampati: I  Neck ROM: Full    Dental  (+) Edentulous Lower, Edentulous Upper, Dental Advisory Given   Pulmonary neg pulmonary ROS, Current Smoker,  breath sounds clear to auscultation        Cardiovascular hypertension, + Peripheral Vascular Disease + dysrhythmias + Valvular Problems/Murmurs AI and MR Rhythm:Regular Rate:Normal + Systolic murmurs    Neuro/Psych    GI/Hepatic GERD-  ,  Endo/Other    Renal/GU      Musculoskeletal   Abdominal   Peds  Hematology   Anesthesia Other Findings   Reproductive/Obstetrics                         Anesthesia Physical Anesthesia Plan  ASA: II  Anesthesia Plan:    Post-op Pain Management:    Induction: Intravenous  Airway Management Planned: Oral ETT and LMA  Additional Equipment:   Intra-op Plan:   Post-operative Plan: Extubation in OR  Informed Consent: I have reviewed the patients History and Physical, chart, labs and discussed the procedure including the risks, benefits and alternatives for the proposed anesthesia with the patient or authorized representative who has indicated his/her understanding and acceptance.   Dental advisory given  Plan Discussed with: CRNA and Surgeon  Anesthesia Plan Comments:         Anesthesia Quick Evaluation

## 2014-02-13 NOTE — Transfer of Care (Signed)
Immediate Anesthesia Transfer of Care Note  Patient: Hannah [name]  Procedure(s) Performed: Procedure(s): TOTAL KNEE ARTHROPLASTY (Left)  Patient Location: PACU  Anesthesia Type:General  Level of Consciousness: awake, oriented, patient cooperative and lethargic  Airway & Oxygen Therapy: Patient Spontanous Breathing and Patient connected to nasal cannula oxygen  Post-op Assessment: Report given to PACU RN and Post -op Vital signs reviewed and stable  Post vital signs: Reviewed and stable  Complications: No apparent anesthesia complications

## 2014-02-13 NOTE — Anesthesia Procedure Notes (Addendum)
Anesthesia Regional Block:  Femoral nerve block  Pre-Anesthetic Checklist: ,, timeout performed, Correct Patient, Correct Site, Correct Laterality, Correct Procedure, Correct Position, site marked, Risks and benefits discussed, at surgeon's request and post-op pain management  Laterality: Left and Upper  Prep: chloraprep       Needles:  Injection technique: Single-shot  Needle Type: Echogenic Needle     Needle Length: 9cm 9 cm Needle Gauge: 22 and 22 G  Needle insertion depth: 4 cm   Additional Needles:  Procedures: ultrasound guided (picture in chart) and nerve stimulator Femoral nerve block  Nerve Stimulator or Paresthesia:  Response: Twitch elicited, 0.8 mA,   Additional Responses:   Narrative:  Start time: 02/13/2014 11:40 AM End time: 02/13/2014 11:55 AM Injection made incrementally with aspirations every 5 mL.  Performed by: Personally  Anesthesiologist: Bartolo Darter, MD  Additional Notes: BP cuff, EKG monitors applied. Sedation begun. Femoral artery palpated for location of nerve. After nerve location anesthetic injected incrementally, slowly , and after neg aspirations. Tolerated well.   Procedure Name: LMA Insertion Date/Time: 02/13/2014 12:58 PM Performed by: Ned Grace Pre-anesthesia Checklist: Patient identified, Patient being monitored, Emergency Drugs available, Timeout performed and Suction available Patient Re-evaluated:Patient Re-evaluated prior to inductionOxygen Delivery Method: Circle system utilized Preoxygenation: Pre-oxygenation with 100% oxygen Intubation Type: IV induction Ventilation: Mask ventilation without difficulty LMA: LMA inserted LMA Size: 4.0 Number of attempts: 1 Placement Confirmation: breath sounds checked- equal and bilateral and positive ETCO2 Tube secured with: Tape

## 2014-02-13 NOTE — Anesthesia Postprocedure Evaluation (Signed)
  Anesthesia Post-op Note  Patient: Hannah [name]  Procedure(s) Performed: Procedure(s): TOTAL KNEE ARTHROPLASTY (Left)  Patient Location: PACU  Anesthesia Type:General and Regional  Level of Consciousness: awake  Airway and Oxygen Therapy: Patient Spontanous Breathing  Post-op Pain: moderate  Post-op Assessment: Post-op Vital signs reviewed, Patient's Cardiovascular Status Stable, Respiratory Function Stable, Patent Airway, No signs of Nausea or vomiting and Pain level controlled  Post-op Vital Signs: Reviewed and stable  Last Vitals:  Filed Vitals:   02/13/14 1707  BP: 156/74  Pulse: 81  Temp: 36.4 C  Resp: 18    Complications: No apparent anesthesia complications

## 2014-02-13 NOTE — Progress Notes (Signed)
Patient unable to give urine sample.  DA

## 2014-02-13 NOTE — Op Note (Signed)
Hannah [name], Hannah [name]               ACCOUNT NO.:  [id number]  MEDICAL RECORD NO.:  [id number]  LOCATION:  5N06C                        FACILITY:  [facility]  PHYSICIAN:  Alta Corning, M.D.   DATE OF BIRTH:  [birth year]  DATE OF PROCEDURE:  02/13/2014 DATE OF DISCHARGE:                              OPERATIVE REPORT   PREOPERATIVE DIAGNOSIS:  End-stage degenerative joint disease, left knee.  POSTOPERATIVE DIAGNOSIS:  End-stage degenerative joint disease, left knee.  PRINCIPAL PROCEDURE:  Left total knee replacement with a Sigma system, size 2.5 femur, size 3 tibia, size 10-mm bridging bearing, and a 35-mm all-polyethylene patella.  SURGEON:  Alta Corning, M.D.  ASSISTANT:  Gary Fleet, P.A.  ANESTHESIA:  General.  BRIEF HISTORY:  Hannah [name] is a 63 year old female with long history of significant complaints of left knee pain.  She had been treated conservatively for prolonged period of time by Dr. Eddie Dibbles with injection therapy, activity modification, viscous supplementation, and physical therapy.  After failure of all conservative care and x-ray showing bone- on-bone change, she was sent to me for evaluation of possible total knee replacement.  At that point, we had a long discussion of treatment, but felt that total knee replacement was appropriate.  She had significant inability to flex the knee past about 90 degrees, and we discussed with her preoperatively that she would likely have the same issue postoperatively.  She was brought to the operating room for this procedure.  DESCRIPTION OF PROCEDURE:  The patient was brought to the operating room.  After adequate anesthesia was obtained with general anesthetic, the patient was placed supine on the operating table.  The left leg was then prepped and draped in usual sterile fashion.  Following this, the leg was exsanguinated, blood pressure tourniquet was inflated to 350 mmHg.  Following this, a midline incision was made in  the subcutaneous tissue, down the level of the extensor mechanism, and a median parapatellar arthrotomy was undertaken.  Once this was done, attention was turned to the knee where a synovectomy was performed followed by removal of anterior and posterior cruciates, medial and lateral meniscus, retropatellar fat pad, and synovium in the anterior aspect of the femur.  Once this was done, attention was turned to the tibia, which cut perpendicular to the long axis.  Attention was then turned to the femur, which was cut with a 5-degree valgus inclination and 10 mm of distal bone was taken at this point.  At this point, the femur was measured to a size 2.5, and anterior and posterior cuts were made, chamfers and box.  Attention was turned to the tibia, sized to a 3, it was drilled and keeled, and following this, attention was turned back to the knee where the trial components were put in place, size 2.5 femur, size 3 tibia with a 10-mm bridging bearing, and the patella was cut down to a level of 13 mm and a 35 paddle was chosen and then lugs were drilled.  Once this was completed, the knee was put through a range of motion.  Excellent stability and range of motion were achieved at this point.  The knee had the components removed at this  point, copiously and thoroughly lavaged and suctioned dry.  The posterior bone was removed as well as losing the posterior capsule as she was certainly tight posterolateral.  Once this was done, attention was turned back to the knee where the trial components were removed.  The knee was copiously and thoroughly lavaged, suctioned dry, and then the final components were cemented in place, size 3 tibia, followed by 2.5 femur, followed by a 10-mm bridging bearing trial and the 35 all poly patella was placed and held with a clamp.  All excess bone cements were removed with cement tools and the cement was allowed to completely harden.  Once it was completely hardened,  the knee was put through a range of motion and excellent stability had been achieved.  The trial component was removed and the final poly was opened and placed.  Medium Hemovac VAC drain was placed.  Medial parapatellar arthrotomy was closed with 1 Vicryl running, skin with 0 and 2-0 Vicryl and 3-0 Monocryl subcuticular. Benzoin and Steri-Strips were applied.  Sterile compressive dressing was applied and the patient was taken to the recovery room, she was noted to be in satisfactory condition.  Estimated blood loss for the procedure was minimal.     Alta Corning, M.D.     Corliss Skains  D:  02/13/2014  T:  02/13/2014  Job:  [id number]

## 2014-02-13 NOTE — Brief Op Note (Signed)
02/13/2014  2:53 PM  PATIENT:  Hannah [name]  63 y.o. female  PRE-OPERATIVE DIAGNOSIS:  DEGENERATIVE JOINT DISEASE L KNEE  POST-OPERATIVE DIAGNOSIS:  DEGENERATIVE JOINT DISEASE L KNEE  PROCEDURE:  Procedure(s): TOTAL KNEE ARTHROPLASTY (Left)  SURGEON:  Surgeon(s) and Role:    * Alta Corning, MD - Primary  PHYSICIAN ASSISTANT:   ASSISTANTS: justin   ANESTHESIA:   general  EBL:  Total I/O In: 1000 [I.V.:1000] Out: -   BLOOD ADMINISTERED:none  DRAINS: (1) Hemovact drain(s) in the l knee with  Suction Open   LOCAL MEDICATIONS USED:  OTHER experel  SPECIMEN:  No Specimen  DISPOSITION OF SPECIMEN:  N/A  COUNTS:  YES  TOURNIQUET:   Total Tourniquet Time Documented: Thigh (Left) - 79 minutes Total: Thigh (Left) - 79 minutes   DICTATION: .Other Dictation: Dictation Number [id number]  PLAN OF CARE: Admit to inpatient   PATIENT DISPOSITION:  PACU - hemodynamically stable.   Delay start of Pharmacological VTE agent (>24hrs) due to surgical blood loss or risk of bleeding: no

## 2014-02-14 LAB — CBC
HCT: 27.8 % — ABNORMAL LOW (ref 36.0–46.0)
Hemoglobin: 9.1 g/dL — ABNORMAL LOW (ref 12.0–15.0)
MCH: 27.2 pg (ref 26.0–34.0)
MCHC: 32.7 g/dL (ref 30.0–36.0)
MCV: 83 fL (ref 78.0–100.0)
Platelets: 210 10*3/uL (ref 150–400)
RBC: 3.35 MIL/uL — ABNORMAL LOW (ref 3.87–5.11)
RDW: 13.2 % (ref 11.5–15.5)
WBC: 7.6 10*3/uL (ref 4.0–10.5)

## 2014-02-14 LAB — BASIC METABOLIC PANEL
Anion gap: 9 (ref 5–15)
BUN: 7 mg/dL (ref 6–23)
CO2: 27 mEq/L (ref 19–32)
Calcium: 8.8 mg/dL (ref 8.4–10.5)
Chloride: 102 mEq/L (ref 96–112)
Creatinine, Ser: 0.7 mg/dL (ref 0.50–1.10)
GFR calc Af Amer: >90 mL/min (ref >90)
GFR calc non Af Amer: 89 mL/min — ABNORMAL LOW (ref >90)
Glucose, Bld: 134 mg/dL — ABNORMAL HIGH (ref 70–99)
Potassium: 3.6 mEq/L — ABNORMAL LOW (ref 3.7–5.3)
Sodium: 138 mEq/L (ref 137–147)

## 2014-02-14 MED ORDER — OXYCODONE-ACETAMINOPHEN 5-325 MG PO TABS: 1.0000 | ORAL_TABLET | ORAL | Status: DC | PRN

## 2014-02-14 MED ORDER — METHOCARBAMOL 750 MG PO TABS: 750.0000 mg | ORAL_TABLET | Freq: Four times a day (QID) | ORAL | Status: DC

## 2014-02-14 MED ORDER — ASPIRIN EC 325 MG PO TBEC: 325.0000 mg | DELAYED_RELEASE_TABLET | Freq: Two times a day (BID) | ORAL | Status: DC

## 2014-02-14 NOTE — Evaluation (Signed)
Occupational Therapy Evaluation Patient Details Name: Hannah [name] MRN: [id number] DOB: 1950-11-04 Today's Date: 02/14/2014    History of Present Illness 63 y.o. female s/p left total knee arthroplasty. Hx of RBBB, HTN, and PVD.   Clinical Impression   Pt admitted with the above diagnoses and presents with below problem list. Pt will benefit from continued acute OT to address the below listed deficits and maximize independence with basic ADLs prior to d/c home with family. PTA pt reports she was independent with ADLs. Pt currently at min guard level for LB ADLs. Pt's daughter will provide 24 hour supervision/assistance. ADL education provided.     Follow Up Recommendations  Supervision/Assistance - 24 hour;No OT follow up    Equipment Recommendations  Other (comment) (pt has recommended equipment)    Recommendations for Other Services       Precautions / Restrictions Precautions Precautions: Knee Precaution Comments: reviewed knee precautions Required Braces or Orthoses: Knee Immobilizer - Left Knee Immobilizer - Left: On except when in CPM Restrictions Weight Bearing Restrictions: Yes LLE Weight Bearing: Weight bearing as tolerated      Mobility Bed Mobility               General bed mobility comments: in recliner  Transfers Overall transfer level: Needs assistance Equipment used: Rolling walker (2 wheeled) Transfers: Sit to/from Stand Sit to Stand: Min guard              Balance Overall balance assessment: Needs assistance Sitting-balance support: Bilateral upper extremity supported;Feet supported Sitting balance-Leahy Scale: Good     Standing balance support: Bilateral upper extremity supported;During functional activity Standing balance-Leahy Scale: Fair                              ADL Overall ADL's : Needs assistance/impaired Eating/Feeding: Set up;Sitting   Grooming: Set up;Sitting;Standing   Upper Body Bathing: Set  up;Sitting   Lower Body Bathing: Min guard;With adaptive equipment;Sit to/from stand   Upper Body Dressing : Set up;Sitting   Lower Body Dressing: Min guard;With adaptive equipment;Sit to/from stand   Toilet Transfer: Min guard;Ambulation;RW (3n1 over toilet)   Toileting- Clothing Manipulation and Hygiene: Min guard;Sit to/from stand   Tub/ Shower Transfer: Min guard;Ambulation;3 in 1;Rolling walker   Functional mobility during ADLs: Min guard;Rolling walker General ADL Comments: Educated pt and daughter on techniques and AE for safe completion of ADLs. Pt's daughter practiced donning straps on KI. Educated on KI and zero knee     Vision                     Perception     Praxis      Pertinent Vitals/Pain Pain Assessment: 0-10 Pain Score: 8  Pain Location: left knee Pain Descriptors / Indicators: Aching Pain Intervention(s): Limited activity within patient's tolerance;Monitored during session;Repositioned;Utilized relaxation techniques     Hand Dominance Right   Extremity/Trunk Assessment Upper Extremity Assessment Upper Extremity Assessment: Overall WFL for tasks assessed   Lower Extremity Assessment Lower Extremity Assessment: Defer to PT evaluation   Cervical / Trunk Assessment Cervical / Trunk Assessment: Normal   Communication Communication Communication: No difficulties   Cognition Arousal/Alertness: Awake/alert Behavior During Therapy: WFL for tasks assessed/performed Overall Cognitive Status: Within Functional Limits for tasks assessed                     General Comments       Exercises  Shoulder Instructions      Home Living Family/patient expects to be discharged to:: Private residence Living Arrangements: Children Available Help at Discharge: Family;Available 24 hours/day Type of Home: Apartment Home Access: Level entry     Home Layout: One level     Bathroom Shower/Tub: Tub/shower unit;Curtain Shower/tub  characteristics: Architectural technologist: Handicapped height Bathroom Accessibility: Yes How Accessible: Accessible via walker Home Equipment: [place] - 2 wheels;Bedside commode          Prior Functioning/Environment Level of Independence: Independent             OT Diagnosis: Acute pain   OT Problem List: Decreased activity tolerance;Impaired balance (sitting and/or standing);Decreased knowledge of use of DME or AE;Decreased knowledge of precautions;Pain   OT Treatment/Interventions: Self-care/ADL training;Therapeutic exercise;DME and/or AE instruction;Therapeutic activities;Patient/family education;Balance training    OT Goals(Current goals can be found in the care plan section) Acute Rehab OT Goals Patient Stated Goal: walk better/faster, go home OT Goal Formulation: With patient Time For Goal Achievement: 02/21/14 Potential to Achieve Goals: Good ADL Goals Pt Will Perform Lower Body Bathing: with modified independence;with adaptive equipment;sit to/from stand Pt Will Perform Lower Body Dressing: with modified independence;with adaptive equipment;sit to/from stand Pt Will Transfer to Toilet: with modified independence;ambulating (3n1 over toilet) Pt Will Perform Toileting - Clothing Manipulation and hygiene: with modified independence;with adaptive equipment;sit to/from stand Pt Will Perform Tub/Shower Transfer: with modified independence;ambulating;3 in 1;rolling walker  OT Frequency: Min 2X/week   Barriers to D/C:            Co-evaluation              End of Session Equipment Utilized During Treatment: Gait belt;Rolling walker;Left knee immobilizer  Activity Tolerance: Patient tolerated treatment well Patient left: in chair;with call bell/phone within reach;with family/visitor present   Time: 6967-8938 OT Time Calculation (min): 27 min Charges:  OT General Charges $OT Visit: 1 Procedure OT Evaluation $Initial OT Evaluation Tier I: 1 Procedure OT  Treatments $Self Care/Home Management : 8-22 mins G-Codes:    Hortencia Pilar 2014/02/22, 4:05 PM

## 2014-02-14 NOTE — Progress Notes (Signed)
Utilization review completed.  

## 2014-02-14 NOTE — Progress Notes (Signed)
Physical Therapy Treatment Patient Details Name: Hannah [name] MRN: [id number] DOB: [birth year] Today's Date: 02/14/2014    History of Present Illness 63 y.o. female s/p left total knee arthroplasty. Hx of RBBB, HTN, and PVD.    PT Comments    Patient is progressing well towards physical therapy goals, ambulating up to 425 feet with supervision while using a rolling walker. Tolerating therapeutic exercises well and is motivated to return home today if able. All education has been reviewed and I feel she is adequate for d/c from a mobility standpoint when medically ready. Patient will continue to benefit from skilled physical therapy services at home with HHPT to further improve independence with functional mobility.    Follow Up Recommendations  Home health PT;Supervision for mobility/OOB     Equipment Recommendations  None recommended by PT    Recommendations for Other Services OT consult     Precautions / Restrictions Precautions Precautions: Knee Precaution Comments: reviewed knee precautions Required Braces or Orthoses: Knee Immobilizer - Left Knee Immobilizer - Left: On except when in CPM Restrictions Weight Bearing Restrictions: Yes LLE Weight Bearing: Weight bearing as tolerated    Mobility  Bed Mobility               General bed mobility comments: in recliner  Transfers Overall transfer level: Needs assistance Equipment used: Rolling walker (2 wheeled) Transfers: Sit to/from Stand Sit to Stand: Supervision         General transfer comment: Supervision for safety with VC for hand placement. performed from reclining chair. No loss of balance upon standing  Ambulation/Gait Ambulation/Gait assistance: Supervision Ambulation Distance (Feet): 425 Feet Assistive device: Rolling walker (2 wheeled) Gait Pattern/deviations: Step-through pattern;Decreased step length - right;Decreased stance time - left;Antalgic   Gait velocity interpretation: Below normal  speed for age/gender General Gait Details: VC for improving step symmetry and forward gaze.  Able to continuously push RW. No buckling with knee immobilizer in place. Occasional cues for walker placement.   Stairs            Wheelchair Mobility    Modified Rankin (Stroke Patients Only)       Balance Overall balance assessment: Needs assistance Sitting-balance support: Bilateral upper extremity supported;Feet supported Sitting balance-Leahy Scale: Good     Standing balance support: Bilateral upper extremity supported;During functional activity Standing balance-Leahy Scale: Fair                      Cognition Arousal/Alertness: Awake/alert Behavior During Therapy: WFL for tasks assessed/performed Overall Cognitive Status: Within Functional Limits for tasks assessed                      Exercises Total Joint Exercises Ankle Circles/Pumps: AROM;Both;10 reps;Seated Quad Sets: AROM;Both;10 reps;Seated Heel Slides: AAROM;Left;10 reps;Supine Straight Leg Raises: Left;10 reps;Supine;Strengthening Long Arc Quad: AROM;Left;10 reps;Seated Knee Flexion: AROM;Left;10 reps;Seated Goniometric ROM: 6-79 degrees left knee flexion    General Comments        Pertinent Vitals/Pain Pain Assessment: 0-10 Pain Score: 8  Pain Location: left knee Pain Descriptors / Indicators: Aching Pain Intervention(s): Limited activity within patient's tolerance;Monitored during session;Repositioned    Home Living Family/patient expects to be discharged to:: Private residence Living Arrangements: Children Available Help at Discharge: Family;Available 24 hours/day Type of Home: Apartment Home Access: Level entry   Home Layout: One level Home Equipment: Walker - 2 wheels;Bedside commode      Prior Function Level of Independence: Independent  PT Goals (current goals can now be found in the care plan section) Acute Rehab PT Goals Patient Stated Goal: go home PT  Goal Formulation: With patient Time For Goal Achievement: 02/21/14 Potential to Achieve Goals: Good Progress towards PT goals: Progressing toward goals    Frequency  7X/week    PT Plan Current plan remains appropriate    Co-evaluation             End of Session Equipment Utilized During Treatment: Left knee immobilizer Activity Tolerance: Patient tolerated treatment well Patient left: in chair;with call bell/phone within reach;with family/visitor present     Time: 8412-8208 PT Time Calculation (min): 28 min  Charges:  $Gait Training: 8-22 mins $Therapeutic Exercise: 8-22 mins                    G Codes:      [employer], [place]  [name] 02/14/2014, 4:21 PM

## 2014-02-14 NOTE — Progress Notes (Signed)
Subjective: 1 Day Post-Op Procedure(s) (LRB): TOTAL KNEE ARTHROPLASTY (Left) Patient reports pain as mild.    Objective: Vital signs in last 24 hours: Temp:  [97.5 F (36.4 C)-98.9 F (37.2 C)] 98.9 F (37.2 C) (08/18 0511) Pulse Rate:  [53-95] 88 (08/18 0511) Resp:  [14-19] 18 (08/17 1707) BP: (120-167)/(56-88) 126/66 mmHg (08/18 0511) SpO2:  [95 %-100 %] 98 % (08/18 0511) Weight:  [130 lb (58.968 kg)] 130 lb (58.968 kg) (08/17 1741)  Intake/Output from previous day: 08/17 0701 - 08/18 0700 In: 2650 [P.O.:240; I.V.:2410] Out: 760 [Drains:760] Intake/Output this shift:     Recent Labs  02/14/14 0350  HGB 9.1*    Recent Labs  02/14/14 0350  WBC 7.6  RBC 3.35*  HCT 27.8*  PLT 210    Recent Labs  02/14/14 0350  NA 138  K 3.6*  CL 102  CO2 27  BUN 7  CREATININE 0.70  GLUCOSE 134*  CALCIUM 8.8   No results found for this basename: LABPT, INR,  in the last 72 hours  Neurologically intact ABD soft Neurovascular intact Sensation intact distally Intact pulses distally No cellulitis present Compartment soft  Assessment/Plan: 1 Day Post-Op Procedure(s) (LRB): TOTAL KNEE ARTHROPLASTY (Left) Advance diet Up with therapy Discharge home with home health later today if clears therapy or tomorrow if needs extra therapy  Kami Kube L 02/14/2014, 9:24 AM

## 2014-02-14 NOTE — Care Management Note (Signed)
CARE MANAGEMENT NOTE 02/14/2014  Patient:  VICKII, VOLLAND   Account Number:  [id number]  Date Initiated:  02/14/2014  Documentation initiated by:  Ricki Miller  Subjective/Objective Assessment:   63 yr old female s/p left total knee arthroplasty.     Action/Plan:   Case manager spoke with patient concerning home health and DME needs at discharge. Patient preoperatively setup with [facility],no changes. Patient states she has support at discharge.   Anticipated DC Date:  02/15/2014   Anticipated DC Plan:  [place]  CM consult      [facility] Choice  HOME HEALTH  DURABLE MEDICAL EQUIPMENT   Choice offered to / List presented to:  C-1 Patient   DME arranged  3-N-1  [place]  CPM      DME agency  [employer]     [place] arranged  HH-2 PT      [facility] agency  [place]   Status of service:  Completed, signed off Medicare Important Message given?  NA - LOS <3 / Initial given by admissions (If response is "NO", the following Medicare IM given date fields will be blank) Date Medicare IM given:   Medicare IM given by:   Date Additional Medicare IM given:   Additional Medicare IM given by:    Discharge Disposition:  [place]  Per UR Regulation:  Reviewed for med. necessity/level of care/duration of stay

## 2014-02-14 NOTE — Progress Notes (Signed)
Dr Berenice Primas called on cell left message pt felt she was not ready to leave today

## 2014-02-14 NOTE — Evaluation (Signed)
Physical Therapy Evaluation Patient Details Name: Hannah [name] MRN: [id number] DOB: 1951-01-12 Today's Date: 02/14/2014   History of Present Illness  63 y.o. female s/p left total knee arthroplasty. Hx of RBBB, HTN, and PVD.  Clinical Impression  Pt is s/p left TKA resulting in the deficits listed below (see PT Problem List). Ambulates up to 180 feet with min guard while using a rolling walker. Will follow up this afternoon and feel that she will be ready for d/c from a mobility standpoint when medically ready. Pt will benefit from skilled PT to increase their independence and safety with mobility to allow discharge to the venue listed below.     Follow Up Recommendations Home health PT;Supervision for mobility/OOB    Equipment Recommendations  None recommended by PT    Recommendations for Other Services OT consult     Precautions / Restrictions Precautions Precautions: Knee Precaution Comments: Reviewed knee precautions and use of Zero knee. Required Braces or Orthoses: Knee Immobilizer - Left Knee Immobilizer - Left: On except when in CPM Restrictions Weight Bearing Restrictions: Yes LLE Weight Bearing: Weight bearing as tolerated      Mobility  Bed Mobility                  Transfers Overall transfer level: Needs assistance Equipment used: Rolling walker (2 wheeled) Transfers: Sit to/from Stand Sit to Stand: Supervision         General transfer comment: Supervision for safety with VC for hand placement. performed from reclining chair. Good stability upon standing.  Ambulation/Gait Ambulation/Gait assistance: Min guard Ambulation Distance (Feet): 180 Feet Assistive device: Rolling walker (2 wheeled) Gait Pattern/deviations: Step-through pattern;Decreased step length - right;Decreased stance time - left;Antalgic   Gait velocity interpretation: Below normal speed for age/gender General Gait Details: Mildly antalgic gait pattern. Educated on safe DME use  and patient was able to progress to step-through gait pattern while continuously pushing RW. VC for forward gaze.  Stairs            Wheelchair Mobility    Modified Rankin (Stroke Patients Only)       Balance Overall balance assessment: Needs assistance Sitting-balance support: No upper extremity supported;Feet supported Sitting balance-Leahy Scale: Good     Standing balance support: No upper extremity supported Standing balance-Leahy Scale: Fair                               Pertinent Vitals/Pain Pain Assessment: 0-10 Pain Score: 8  Pain Location: left knee Pain Intervention(s): Limited activity within patient's tolerance;Monitored during session;Repositioned    Home Living Family/patient expects to be discharged to:: Private residence Living Arrangements: Children Available Help at Discharge: Family;Available 24 hours/day Type of Home: Apartment Home Access: Level entry     Home Layout: One level Home Equipment: Walker - 2 wheels;Bedside commode      Prior Function Level of Independence: Independent with assistive device(s)         Comments: Occasional use of cane     Hand Dominance   Dominant Hand: Right    Extremity/Trunk Assessment   Upper Extremity Assessment: Defer to OT evaluation           Lower Extremity Assessment: LLE deficits/detail   LLE Deficits / Details: decreased strength and ROM as expected post op     Communication   Communication: No difficulties  Cognition Arousal/Alertness: Awake/alert Behavior During Therapy: WFL for tasks assessed/performed Overall Cognitive Status: Within  Functional Limits for tasks assessed                      General Comments General comments (skin integrity, edema, etc.): Reviewed use of knee immobilizer and incentive spirometer. SpO2 maintained 100% on room air throughout therapy session.    Exercises Total Joint Exercises Ankle Circles/Pumps: AROM;Both;10  reps;Seated Quad Sets: AROM;Both;10 reps;Seated      Assessment/Plan    PT Assessment Patient needs continued PT services  PT Diagnosis Difficulty walking;Abnormality of gait;Acute pain   PT Problem List Decreased strength;Decreased range of motion;Decreased activity tolerance;Decreased balance;Decreased mobility;Decreased knowledge of use of DME;Pain  PT Treatment Interventions DME instruction;Gait training;Functional mobility training;Therapeutic activities;Therapeutic exercise;Balance training;Neuromuscular re-education;Patient/family education;Modalities   PT Goals (Current goals can be found in the Care Plan section) Acute Rehab PT Goals Patient Stated Goal: go home PT Goal Formulation: With patient Time For Goal Achievement: 02/21/14 Potential to Achieve Goals: Good    Frequency 7X/week   Barriers to discharge        Co-evaluation               End of Session   Activity Tolerance: Patient tolerated treatment well Patient left: in chair;with call bell/phone within reach Nurse Communication: Mobility status         Time: 3300-7622 PT Time Calculation (min): 27 min   Charges:   PT Evaluation $Initial PT Evaluation Tier I: 1 Procedure PT Treatments $Gait Training: 8-22 mins   PT G Codes:        Elayne Snare, [place]   [name] 02/14/2014, 12:44 PM

## 2014-02-15 ENCOUNTER — Encounter (HOSPITAL_COMMUNITY): Payer: Self-pay | Admitting: Orthopedic Surgery

## 2014-02-15 LAB — CBC
HCT: 27.5 % — ABNORMAL LOW (ref 36.0–46.0)
Hemoglobin: 9 g/dL — ABNORMAL LOW (ref 12.0–15.0)
MCH: 27 pg (ref 26.0–34.0)
MCHC: 32.7 g/dL (ref 30.0–36.0)
MCV: 82.6 fL (ref 78.0–100.0)
Platelets: 227 10*3/uL (ref 150–400)
RBC: 3.33 MIL/uL — ABNORMAL LOW (ref 3.87–5.11)
RDW: 13.2 % (ref 11.5–15.5)
WBC: 9.6 10*3/uL (ref 4.0–10.5)

## 2014-02-15 NOTE — Discharge Instructions (Signed)
Total Knee Replacement Total knee replacement is a procedure to replace your knee joint with an artificial knee joint (prosthetic knee joint). The purpose of this surgery is to reduce pain and improve your knee function. LET [facility] CARE PROVIDER KNOW ABOUT:   Any allergies you have.  All medicines you are taking, including vitamins, herbs, eye drops, creams, and over-the-counter medicines.  Previous problems you or members of your family have had with the use of anesthetics.  Any blood disorders you have.  Previous surgeries you have had.  Medical conditions you have. RISKS AND COMPLICATIONS  Generally, total knee replacement is a safe procedure. However, problems can occur and include:  Loss of range of motion of the knee or instability.  Loosening of the prosthesis.  Infection.  Persistent pain. BEFORE THE PROCEDURE   Do not eat or drink anything after midnight on the night before the procedure or as directed by your health care provider.  Ask your health care provider about changing or stopping your regular medicines. This is especially important if you are taking diabetes medicines or blood thinners. PROCEDURE   Just before the procedure, you will receive medicine that will make you drowsy (sedative). This will be given through a tube that is inserted into one of your veins (IV tube).  Then you will be given one of the following:  A medicine injected into your spine that numbs your body below the waist (spinal anesthetic).  A medicine that makes you fall asleep (general anesthetic).  You may also receive medicine to block feeling in your leg (nerve block) to help ease pain after surgery.  An incision will be made in your knee. Your surgeon will take out any damaged cartilage and bone by sawing off the damaged surfaces.  The surgeon will then put a new metal liner over the sawed-off portion of your thigh bone (femur) and a plastic liner over the sawed-off portion  of one of the bones of your lower leg (tibia). This is to restore alignment and function to your knee. A plastic piece is often used to restore the surface of your knee cap. AFTER THE PROCEDURE   You will be taken to the recovery area.  You may have drainage tubes to drain excess fluid from your knee. These tubes attach to a device that removes these fluids.  Once you are awake, stable, and taking fluids well, you will be taken to your hospital room.  You will receive physical therapy as prescribed by your health care provider.  Your surgeon may recommend that you spend time (usually an additional 10-14 days) in an extended-care facility to help you begin walking again and improve your range of motion before you go home.  You may also be prescribed blood-thinning medicine to decrease your risk of developing blood clots in your leg. Document Released: 09/22/2000 Document Revised: 10/31/2013 Document Reviewed: 07/27/2011 [facility] Patient Information 2015 [place], [place]. This information is not intended to replace advice given to you by your health care provider. Make sure you discuss any questions you have with your health care provider.

## 2014-02-15 NOTE — Progress Notes (Signed)
Physical Therapy Treatment Patient Details Name: Hannah [name] MRN: [id number] DOB: [birth year] Today's Date: 02/15/2014    History of Present Illness 63 y.o. female s/p left total knee arthroplasty. Hx of RBBB, HTN, and PVD.    PT Comments    Patient progressing well with mobility this morning. Ambulated without KI with no knee buckling. Patient eager to DC home this morning.   Follow Up Recommendations  Home health PT;Supervision for mobility/OOB     Equipment Recommendations  None recommended by PT    Recommendations for Other Services       Precautions / Restrictions Precautions Precautions: Knee Knee Immobilizer - Left: On except when in CPM Restrictions Weight Bearing Restrictions: Yes LLE Weight Bearing: Weight bearing as tolerated    Mobility  Bed Mobility               General bed mobility comments: in recliner  Transfers Overall transfer level: Modified independent                  Ambulation/Gait Ambulation/Gait assistance: Supervision Ambulation Distance (Feet): 500 Feet Assistive device: Rolling walker (2 wheeled) Gait Pattern/deviations: Step-through pattern;Decreased stride length     General Gait Details: Some cues for safety with RW otherwise mobillizing well.    Stairs            Wheelchair Mobility    Modified Rankin (Stroke Patients Only)       Balance                                    Cognition Arousal/Alertness: Awake/alert Behavior During Therapy: WFL for tasks assessed/performed Overall Cognitive Status: Within Functional Limits for tasks assessed                      Exercises Total Joint Exercises Quad Sets: AROM;Both;10 reps Heel Slides: AAROM;Left;10 reps Hip ABduction/ADduction: AAROM;Left;10 reps Straight Leg Raises: Left;10 reps;Strengthening Long Arc Quad: AAROM;Left;10 reps    General Comments        Pertinent Vitals/Pain Pain Score: 7  Pain Location: L  knee Pain Descriptors / Indicators: Constant Pain Intervention(s): Patient requesting pain meds-RN notified;Monitored during session    Home Living                      Prior Function            PT Goals (current goals can now be found in the care plan section) Progress towards PT goals: Progressing toward goals    Frequency  7X/week    PT Plan Current plan remains appropriate    Co-evaluation             End of Session   Activity Tolerance: Patient tolerated treatment well Patient left: in chair;with call bell/phone within reach     Time: 0750-0815 PT Time Calculation (min): 25 min  Charges:  $Gait Training: 8-22 mins $Therapeutic Exercise: 8-22 mins                    G Codes:      Jacqualyn Posey 02/15/2014, 8:57 AM 02/15/2014 Jacqualyn Posey PTA [phone] pager [phone] office

## 2014-02-15 NOTE — Progress Notes (Signed)
Subjective: 2 Days Post-Op Procedure(s) (LRB): TOTAL KNEE ARTHROPLASTY (Left) Patient reports pain as mild.    Objective: Vital signs in last 24 hours: Temp:  [98.2 F (36.8 C)-98.3 F (36.8 C)] 98.2 F (36.8 C) (08/19 0526) Pulse Rate:  [93] 93 (08/19 0526) Resp:  [16-18] 17 (08/19 0526) BP: (125-128)/(66-69) 128/66 mmHg (08/19 0526) SpO2:  [96 %-98 %] 96 % (08/19 0526)  Intake/Output from previous day: 08/18 0701 - 08/19 0700 In: 8159 [P.O.:600; I.V.:1050] Out: -  Intake/Output this shift:     Recent Labs  02/14/14 0350 02/15/14 0410  HGB 9.1* 9.0*    Recent Labs  02/14/14 0350 02/15/14 0410  WBC 7.6 9.6  RBC 3.35* 3.33*  HCT 27.8* 27.5*  PLT 210 227    Recent Labs  02/14/14 0350  NA 138  K 3.6*  CL 102  CO2 27  BUN 7  CREATININE 0.70  GLUCOSE 134*  CALCIUM 8.8   No results found for this basename: LABPT, INR,  in the last 72 hours  Neurologically intact ABD soft Neurovascular intact Sensation intact distally Intact pulses distally No cellulitis present Compartment soft  Assessment/Plan: 2 Days Post-Op Procedure(s) (LRB): TOTAL KNEE ARTHROPLASTY (Left) Advance diet Up with therapy Discharge home with home health Dressing change prior to d/c  Hannah [name] L 02/15/2014, 9:31 AM

## 2014-02-19 DIAGNOSIS — M1712 Unilateral primary osteoarthritis, left knee: Secondary | ICD-10-CM | POA: Diagnosis present

## 2014-02-19 NOTE — Discharge Summary (Signed)
Patient ID: Hannah [name] MRN: [id number] DOB/AGE: 1950-11-11 63 y.o.  Admit date: 02/13/2014 Discharge date: 02/15/2014 Admission Diagnoses:  Principal Problem:   Osteoarthritis of left knee   Discharge Diagnoses:  Same  Past Medical History  Diagnosis Date  . Anxiety   . Depression   . Arthritis   . Hypertension   . Allergy   . Heart murmur     mild-moderate AR  . PVD (peripheral vascular disease)     moderate carotid disease  . Dyslipidemia   . RBBB   . Smoker   . Carpal tunnel syndrome     Surgeries: Procedure(s): Left TOTAL KNEE ARTHROPLASTY on 02/13/2014    Discharged Condition: Improved  Hospital Course: Hannah [name] is an 63 y.o. female who was admitted 02/13/2014 for operative treatment ofOsteoarthritis of left knee. Patient has severe unremitting pain that affects sleep, daily activities, and work/hobbies. After pre-op clearance the patient was taken to the operating room on 02/13/2014 and underwent  Procedure(s): Left TOTAL KNEE ARTHROPLASTY.    Patient was given perioperative antibiotics:  Anti-infectives   Start     Dose/Rate Route Frequency Ordered Stop   02/13/14 0600  ceFAZolin (ANCEF) IVPB 2 g/50 mL premix     2 g 100 mL/hr over 30 Minutes Intravenous On call to O.R. 02/12/14 1342 02/13/14 1300       Patient was given sequential compression devices, early ambulation, and chemoprophylaxis to prevent DVT.  Patient benefited maximally from hospital stay and there were no complications.    Recent vital signs: See chart for details.   Recent laboratory studies: See chart for details. Laboratory values were stable and reviewed. Hemoglobin & Hematocrit     Component Value Date/Time   HGB 9.0* 02/15/2014 0410   HGB 11.7* 10/15/2013 1313   HCT 27.5* 02/15/2014 0410   HCT 36.8* 10/15/2013 1313       Discharge Medications:     Medication List    STOP taking these medications       aspirin 81 MG tablet  Replaced by:  aspirin EC 325 MG tablet       TAKE these medications       ALPRAZolam 0.5 MG tablet  Commonly known as:  XANAX  Take 0.5 mg by mouth at bedtime as needed for sleep.     aspirin EC 325 MG tablet  Take 1 tablet (325 mg total) by mouth 2 (two) times daily.     bisoprolol-hydrochlorothiazide 5-6.25 MG per tablet  Commonly known as:  ZIAC  Take 1 tablet by mouth daily.     cholecalciferol 1000 UNITS tablet  Commonly known as:  VITAMIN D  Take 1,000 Units by mouth daily.     estradiol 1 MG tablet  Commonly known as:  ESTRACE  Take 1 mg by mouth daily.     fenofibrate 160 MG tablet  Take 160 mg by mouth daily.     fexofenadine 180 MG tablet  Commonly known as:  ALLEGRA  Take 180 mg by mouth daily.     HYDROcodone-acetaminophen 5-325 MG per tablet  Commonly known as:  NORCO  Take 1 tablet by mouth every 6 (six) hours as needed for pain.     methocarbamol 750 MG tablet  Commonly known as:  ROBAXIN-750  Take 1 tablet (750 mg total) by mouth 4 (four) times daily.     omeprazole 40 MG capsule  Commonly known as:  PRILOSEC  Take 2 capsules (80 mg total) by mouth daily.  oxyCODONE-acetaminophen 5-325 MG per tablet  Commonly known as:  ROXICET  Take 1-2 tablets by mouth every 4 (four) hours as needed for severe pain.     pravastatin 20 MG tablet  Commonly known as:  PRAVACHOL  Take 1 tablet (20 mg total) by mouth every evening.        Diagnostic Studies: Dg Chest 2 View  02/08/2014   CLINICAL DATA:  Preoperative respect or exam for total knee arthroplasty. Hypertension. Heart murmur. Smoking history.  EXAM: CHEST  2 VIEW  COMPARISON:  06/02/2008  FINDINGS: Heart size is normal. Mediastinal shadows are normal. The lungs are clear. No effusions. Ordinary degenerative changes affect the spine.  IMPRESSION: No active cardiopulmonary disease.   Electronically Signed   By: Nelson Chimes M.D.   On: 02/08/2014 14:51    Disposition: 06-Home-Health Care Svc        Follow-up Information   Follow up  with [place]. (Someone from [facility] will contact you concerning start date and time for therapy.)    Specialty:  Home Health Services   Contact information:   [address] [place] [address] [phone]       Follow up with GRAVES,JOHN L, MD In 2 weeks.   Specialty:  Orthopedic Surgery   Contact information:   [place] [address] [phone]        Signed: Erlene Senters 02/19/2014, 10:54 AM

## 2014-05-10 ENCOUNTER — Encounter: Payer: Self-pay | Admitting: *Deleted

## 2014-05-11 ENCOUNTER — Other Ambulatory Visit (INDEPENDENT_AMBULATORY_CARE_PROVIDER_SITE_OTHER): Payer: Commercial Managed Care - HMO | Admitting: *Deleted

## 2014-05-11 DIAGNOSIS — Z79899 Other long term (current) drug therapy: Secondary | ICD-10-CM

## 2014-05-11 DIAGNOSIS — E782 Mixed hyperlipidemia: Secondary | ICD-10-CM

## 2014-05-11 LAB — LIPID PANEL
Cholesterol: 188 mg/dL (ref 0–200)
HDL: 33 mg/dL — ABNORMAL LOW (ref >39.00)
NonHDL: 155
Total CHOL/HDL Ratio: 6
Triglycerides: 255 mg/dL — ABNORMAL HIGH (ref 0.0–149.0)
VLDL: 51 mg/dL — ABNORMAL HIGH (ref 0.0–40.0)

## 2014-05-11 LAB — HEPATIC FUNCTION PANEL
ALT: 14 U/L (ref 0–35)
AST: 21 U/L (ref 0–37)
Albumin: 3.7 g/dL (ref 3.5–5.2)
Alkaline Phosphatase: 59 U/L (ref 39–117)
Bilirubin, Direct: 0 mg/dL (ref 0.0–0.3)
Total Bilirubin: 0.2 mg/dL (ref 0.2–1.2)
Total Protein: 7.2 g/dL (ref 6.0–8.3)

## 2014-05-11 LAB — LDL CHOLESTEROL, DIRECT: Direct LDL: 102.7 mg/dL

## 2014-05-16 ENCOUNTER — Encounter: Payer: Self-pay | Admitting: Cardiology

## 2014-05-16 ENCOUNTER — Ambulatory Visit (INDEPENDENT_AMBULATORY_CARE_PROVIDER_SITE_OTHER): Payer: Commercial Managed Care - HMO | Admitting: Pharmacist

## 2014-05-16 ENCOUNTER — Ambulatory Visit (INDEPENDENT_AMBULATORY_CARE_PROVIDER_SITE_OTHER): Payer: Commercial Managed Care - HMO | Admitting: Cardiology

## 2014-05-16 VITALS — BP 126/74 | HR 58 | Ht 62.0 in | Wt 122.6 lb

## 2014-05-16 VITALS — Wt 123.5 lb

## 2014-05-16 DIAGNOSIS — E782 Mixed hyperlipidemia: Secondary | ICD-10-CM

## 2014-05-16 DIAGNOSIS — I4510 Unspecified right bundle-branch block: Secondary | ICD-10-CM

## 2014-05-16 DIAGNOSIS — I739 Peripheral vascular disease, unspecified: Secondary | ICD-10-CM

## 2014-05-16 NOTE — Progress Notes (Signed)
Patient is a pleasant 63 y.o. AAF who was referred to lipid clinic by Dr. Percival Spanish due to h/o elevated TG and non-HDL.  Patient states she has had allergic reactions to statins in the past (hives / dizziness).  She started fenofibrate 160 mg qd 11/2013 due to TG of 800 mg/dL at that time.  She tried fish oil in the past, but had trouble swallowing these large capsules (never tried liquid fish oil or krill oil).  At the last visit, we added pravastatin 20mg  daily.  Pt states she is doing well with it.  She has body aches and pains all the time that she attributes more to arthritis than anything else.  She had a total knee replacement in August.      Patient has been on oral estradiol for the past 20 years since her hysterectomy.  She has cut her tobacco use down from 1 ppd down to 1 pack every 5-6 days.  Failed Zyban and Chantix in the past due to side effects.  Tried the patch, but non-compliant due to skin breaking out in a rash.   RF:  Carotid stenosis, HTN, tobacco use (1/4 ppd), age, low HDL - LDL goal preferably < 100, and non-HDL goal < 130 Meds:  Fenofibrate 160 mg qd, pravastatin 20mg   Intolerant:  Lipitor (dizziness), Zocor (hives)  Diet:  Previously ate eggs/sausage/bacon every morning, but in 09/2013 she stopped eating this.  Now eats fruit most mornings or toast.  She eats a sandwich most days for lunch, and a low fat dinner.  She eats out 2-3 times per week.  She does admit to eating desserts 2-3 times per week.  Rarely drinks soda.  Doesn't drink alcohol.  She has started eating [place] stew with the cooler weather.  Social history:  Smoking 1/5 ppd.  Doesn't drink alcohol. Exercise: Recently had TKR.  [facility] finished [facility]   Labs: 03/2014: TC 188, TG 255, HDL 33, LDL 103, non-HDL 155, LFTs normal (fenofibrate 160mg  and pravastatin 20mg ) 01/2014:  TC 196, TG 271, HDL 35, LDL 115, non-HDL 160, LFTs normal (fenofibrate 160  mg qd) 09/2013:  TC 300, TG 828, HDL 29, LDL not calc, non-HDL 271, TSH normal, glucose normal, LFTs normal (not on lipid lowering therapy.  On oral estradiol daily)  Current Outpatient Prescriptions  Medication Sig Dispense Refill  . ALPRAZolam (XANAX) 0.5 MG tablet Take 0.5 mg by mouth at bedtime as needed for sleep.    Marland Kitchen aspirin EC 325 MG tablet Take 1 tablet (325 mg total) by mouth 2 (two) times daily. 30 tablet 0  . bisoprolol-hydrochlorothiazide (ZIAC) 5-6.25 MG per tablet Take 1 tablet by mouth daily. 90 tablet 3  . cholecalciferol (VITAMIN D) 1000 UNITS tablet Take 1,000 Units by mouth daily.    Marland Kitchen estradiol (ESTRACE) 1 MG tablet Take 1 mg by mouth daily.    . fenofibrate 160 MG tablet Take 160 mg by mouth daily.    . fexofenadine (ALLEGRA) 180 MG tablet Take 180 mg by mouth daily.    Marland Kitchen HYDROcodone-acetaminophen (NORCO) 5-325 MG per tablet Take 1 tablet by mouth every 6 (six) hours as needed for pain. 30 tablet 0  . methocarbamol (ROBAXIN-750) 750 MG tablet Take 1 tablet (750 mg total) by mouth 4 (four) times daily. 60 tablet 0  . omeprazole (PRILOSEC) 40 MG capsule Take 2 capsules (80 mg total) by mouth daily. 60 capsule 6  .  oxyCODONE-acetaminophen (ROXICET) 5-325 MG per tablet Take 1-2 tablets by mouth every 4 (four) hours as needed for severe pain. 50 tablet 0  . pravastatin (PRAVACHOL) 20 MG tablet Take 1 tablet (20 mg total) by mouth every evening. 30 tablet 6   No current facility-administered medications for this visit.   Allergies  Allergen Reactions  . Amlodipine   . Chantix [Varenicline Tartrate] Other (See Comments)    Tongue swell,sob  . Clarithromycin Rash  . Lisinopril Hives  . Simvastatin Hives  . Wellbutrin [Bupropion Hcl] Hives  . Lipitor [Atorvastatin]     Dizziness per patient   Family History  Problem Relation Age of Onset  . Cancer Father   . Diabetes Sister   . Stroke Maternal Grandfather

## 2014-05-16 NOTE — Assessment & Plan Note (Signed)
Pt's LDL improved slightly with addition of pravastatin.  She is right at her goal of <100.  Her TG remain elevated at 255 but much better than >800.  Discussed increasing pravastatin to 40mg  but will not do at this time given she is having some discomfort and unsure if it is medication related or not.  She is okay with staying on pravastatin at this time and given her history of intolerances, will not push the dose at this time.  Encouraged lifestyle improvements.  Will recheck labs in 6 months.

## 2014-05-16 NOTE — Patient Instructions (Signed)
Continue your current medications.  Your cholesterol is going in the right direction.   Try to increase the number of fresh fruits and vegetables in your diet and limit the starches and sugars.   [facility] 3-4 [facility] of the week.    Recheck labs in 6 months.

## 2014-05-16 NOTE — Patient Instructions (Signed)
Schedule Carotid Dopplers for 05/2014   Your physician wants you to follow-up in: 1 year. You will receive a reminder letter in the mail two months in advance. If you don't receive a letter, please call our office to schedule the follow-up appointment.

## 2014-05-16 NOTE — Progress Notes (Signed)
HPI The patient presents for follow up of PVD and a murmur.  She had mild sclerosis on her aortic valve with mild aortic  Insufficiency. She did have carotid artery disease as described below. Since I last saw her she has had knee surgery. She was seen preoperatively for this but she did not the further cardiovascular test. She did well with this. She's doing physical therapy.   The patient denies any new symptoms such as chest discomfort, neck or arm discomfort. There has been no new shortness of breath, PND or orthopnea. There have been no reported palpitations, presyncope or syncope.  Allergies  Allergen Reactions  . Amlodipine   . Chantix [Varenicline Tartrate] Other (See Comments)    Tongue swell,sob  . Clarithromycin Rash  . Lisinopril Hives  . Simvastatin Hives  . Wellbutrin [Bupropion Hcl] Hives  . Lipitor [Atorvastatin]     Dizziness per patient    Current Outpatient Prescriptions  Medication Sig Dispense Refill  . ALPRAZolam (XANAX) 0.5 MG tablet Take 0.5 mg by mouth at bedtime as needed for sleep.    Marland Kitchen aspirin 81 MG tablet Take 81 mg by mouth daily.    . bisoprolol-hydrochlorothiazide (ZIAC) 5-6.25 MG per tablet Take 1 tablet by mouth daily. 90 tablet 3  . cholecalciferol (VITAMIN D) 1000 UNITS tablet Take 1,000 Units by mouth daily.    Marland Kitchen estradiol (ESTRACE) 1 MG tablet Take 1 mg by mouth daily.    . fenofibrate 160 MG tablet Take 160 mg by mouth daily.    . fexofenadine (ALLEGRA) 180 MG tablet Take 180 mg by mouth daily.    Marland Kitchen HYDROcodone-acetaminophen (NORCO) 5-325 MG per tablet Take 1 tablet by mouth every 6 (six) hours as needed for pain. 30 tablet 0  . methocarbamol (ROBAXIN-750) 750 MG tablet Take 1 tablet (750 mg total) by mouth 4 (four) times daily. 60 tablet 0  . omeprazole (PRILOSEC) 40 MG capsule Take 2 capsules (80 mg total) by mouth daily. 60 capsule 6  . pravastatin (PRAVACHOL) 20 MG tablet Take 1 tablet (20 mg total) by mouth every evening. 30 tablet 6   No  current facility-administered medications for this visit.    Past Medical History  Diagnosis Date  . Anxiety   . Depression   . Arthritis   . Hypertension   . Allergy   . Heart murmur     mild-moderate AR  . PVD (peripheral vascular disease)     moderate carotid disease  . Dyslipidemia   . RBBB   . Smoker   . Carpal tunnel syndrome     Past Surgical History  Procedure Laterality Date  . Spine surgery  08/16/2009  . Abdominal hysterectomy  1994  . Tubal ligation    . Colonoscopy w/ polypectomy  2009  . Hemorrhoid surgery    . Total knee arthroplasty Left 02/13/2014    Procedure: TOTAL KNEE ARTHROPLASTY;  Surgeon: Alta Corning, MD;  Location: [place];  Service: Orthopedics;  Laterality: Left;     ROS:  Positive for palpitations, reflux, difficulty swallowing, any pain. Otherwise as stated in the HPI and negative for all other systems.  PHYSICAL EXAM BP 126/74 mmHg  Pulse 58  Ht 5\' 2"  (1.575 m)  Wt 122 lb 9.6 oz (55.611 kg)  BMI 22.42 kg/m2 GENERAL:  Well appearing NECK:  No jugular venous distention, waveform within normal limits, carotid upstroke brisk and symmetric, soft right systolic bruit versus transmitted murmur no thyromegaly and LUNGS:  Clear to  auscultation bilaterally CHEST:  Unremarkable HEART:  PMI not displaced or sustained,S1 and S2 within normal limits, no S3, no S4, no clicks, no rubs, 2/6 apical systolic murmur radiating slightly out the aortic outflow tract murmurs ABD:  Flat, positive bowel sounds normal in frequency in pitch, no bruits, no rebound, no guarding, no midline pulsatile mass, no hepatomegaly, no splenomegaly EXT:  2 plus pulses upper with decreased DP/PT bilaterally, no edema, no cyanosis no clubbing  EKG:  Sinus rhythm, rate 58, right bundle branch block, left anterior fascicular block, no acute ST-T wave changes.  ASSESSMENT AND PLAN  MURMUR:  This is mild aortic sclerosis. We will follow this clinically  RBBB:   No further  evaluation is indicated.   CAROTID STENOSIS:  She will have carotid Dopplers to followup.  This is scheduled for Dec.  DYSLIPIDEMIA:  She has had much improvement with follow up in our lipid clinic.  TOBACCO:  She will now think about quitting and we discussed specifics.

## 2014-05-22 ENCOUNTER — Telehealth: Payer: Self-pay | Admitting: Cardiology

## 2014-05-22 NOTE — Telephone Encounter (Signed)
Spoke with pt.  She now thinks all of her arthritis symptoms are related to her pravastatin.  She took her last dose on Friday and states her symptoms are better now.  Will have her stay off medication and continue fenofibrate only.  Will not be able to add any additional therapy at this time so her LDL will likely increase.  She is aware of this.  Will follow up as previously planned.

## 2014-05-22 NOTE — Telephone Encounter (Signed)
New msg    Patient is having side effects from medication and not sure what to do. Please call at [phone]

## 2014-05-30 ENCOUNTER — Ambulatory Visit (INDEPENDENT_AMBULATORY_CARE_PROVIDER_SITE_OTHER): Payer: Commercial Managed Care - HMO | Admitting: Emergency Medicine

## 2014-05-30 ENCOUNTER — Encounter: Payer: Self-pay | Admitting: Emergency Medicine

## 2014-05-30 VITALS — BP 110/74 | HR 59 | Temp 98.0°F | Resp 16 | Ht 61.0 in | Wt 121.2 lb

## 2014-05-30 DIAGNOSIS — E781 Pure hyperglyceridemia: Secondary | ICD-10-CM

## 2014-05-30 DIAGNOSIS — F419 Anxiety disorder, unspecified: Secondary | ICD-10-CM

## 2014-05-30 DIAGNOSIS — M25562 Pain in left knee: Secondary | ICD-10-CM

## 2014-05-30 DIAGNOSIS — Z13228 Encounter for screening for other metabolic disorders: Secondary | ICD-10-CM

## 2014-05-30 DIAGNOSIS — I10 Essential (primary) hypertension: Secondary | ICD-10-CM

## 2014-05-30 DIAGNOSIS — M25561 Pain in right knee: Secondary | ICD-10-CM

## 2014-05-30 DIAGNOSIS — Z889 Allergy status to unspecified drugs, medicaments and biological substances status: Secondary | ICD-10-CM

## 2014-05-30 DIAGNOSIS — Z23 Encounter for immunization: Secondary | ICD-10-CM

## 2014-05-30 DIAGNOSIS — Z1239 Encounter for other screening for malignant neoplasm of breast: Secondary | ICD-10-CM

## 2014-05-30 DIAGNOSIS — Z1211 Encounter for screening for malignant neoplasm of colon: Secondary | ICD-10-CM

## 2014-05-30 DIAGNOSIS — Z87898 Personal history of other specified conditions: Secondary | ICD-10-CM

## 2014-05-30 DIAGNOSIS — Z9109 Other allergy status, other than to drugs and biological substances: Secondary | ICD-10-CM

## 2014-05-30 DIAGNOSIS — Z130 Encounter for screening for diseases of the blood and blood-forming organs and certain disorders involving the immune mechanism: Secondary | ICD-10-CM

## 2014-05-30 DIAGNOSIS — Z1231 Encounter for screening mammogram for malignant neoplasm of breast: Secondary | ICD-10-CM

## 2014-05-30 DIAGNOSIS — Z9189 Other specified personal risk factors, not elsewhere classified: Secondary | ICD-10-CM

## 2014-05-30 LAB — COMPLETE METABOLIC PANEL WITH GFR
ALT: 11 U/L (ref 0–35)
AST: 19 U/L (ref 0–37)
Albumin: 4.3 g/dL (ref 3.5–5.2)
Alkaline Phosphatase: 56 U/L (ref 39–117)
BUN: 7 mg/dL (ref 6–23)
CO2: 32 mEq/L (ref 19–32)
Calcium: 10 mg/dL (ref 8.4–10.5)
Chloride: 103 mEq/L (ref 96–112)
Creat: 0.86 mg/dL (ref 0.50–1.10)
GFR, Est African American: 82 mL/min
GFR, Est Non African American: 71 mL/min
Glucose, Bld: 109 mg/dL — ABNORMAL HIGH (ref 70–99)
Potassium: 4.4 mEq/L (ref 3.5–5.3)
Sodium: 142 mEq/L (ref 135–145)
Total Bilirubin: 0.3 mg/dL (ref 0.2–1.2)
Total Protein: 6.7 g/dL (ref 6.0–8.3)

## 2014-05-30 LAB — CBC
HCT: 35.7 % — ABNORMAL LOW (ref 36.0–46.0)
Hemoglobin: 11.3 g/dL — ABNORMAL LOW (ref 12.0–15.0)
MCH: 24.6 pg — ABNORMAL LOW (ref 26.0–34.0)
MCHC: 31.7 g/dL (ref 30.0–36.0)
MCV: 77.8 fL — ABNORMAL LOW (ref 78.0–100.0)
MPV: 9.8 fL (ref 9.4–12.4)
Platelets: 328 10*3/uL (ref 150–400)
RBC: 4.59 MIL/uL (ref 3.87–5.11)
RDW: 14.5 % (ref 11.5–15.5)
WBC: 6.5 10*3/uL (ref 4.0–10.5)

## 2014-05-30 MED ORDER — VITAMIN D 1000 UNITS PO TABS: 1000.0000 [IU] | ORAL_TABLET | Freq: Every day | ORAL | Status: DC

## 2014-05-30 MED ORDER — BISOPROLOL-HYDROCHLOROTHIAZIDE 5-6.25 MG PO TABS: 1.0000 | ORAL_TABLET | Freq: Every day | ORAL | Status: DC

## 2014-05-30 MED ORDER — ALPRAZOLAM 0.5 MG PO TABS: 0.5000 mg | ORAL_TABLET | Freq: Every evening | ORAL | Status: DC | PRN

## 2014-05-30 MED ORDER — PRAVASTATIN SODIUM 20 MG PO TABS: 20.0000 mg | ORAL_TABLET | Freq: Every evening | ORAL | Status: DC

## 2014-05-30 MED ORDER — FENOFIBRATE 160 MG PO TABS: 160.0000 mg | ORAL_TABLET | Freq: Every day | ORAL | Status: DC

## 2014-05-30 MED ORDER — OMEPRAZOLE 40 MG PO CPDR: 80.0000 mg | DELAYED_RELEASE_CAPSULE | Freq: Every day | ORAL | Status: DC

## 2014-05-30 MED ORDER — FEXOFENADINE HCL 180 MG PO TABS: 180.0000 mg | ORAL_TABLET | Freq: Every day | ORAL | Status: DC

## 2014-05-30 MED ORDER — ZOSTER VACCINE LIVE 19400 UNT/0.65ML ~~LOC~~ SOLR: 0.6500 mL | Freq: Once | SUBCUTANEOUS | Status: DC

## 2014-05-30 MED ORDER — ESTRADIOL 1 MG PO TABS: 1.0000 mg | ORAL_TABLET | ORAL | Status: DC

## 2014-05-30 NOTE — Progress Notes (Deleted)
MRN: [id number] DOB: 1951/04/27  Subjective:   Hannah [name] is a 63 y.o. female with PMH listed below is here today for a complete physical exam.  August knee replacement, which is going fine.  TDAP ne -Off for a week.  August started the statin therapy, stopped for a week but is now back on it since 2 days ago. -Estradiol: She has taken it for more than 20 years following the hysterectomy.  She had tried to stop using it, but mood changes -Appetite change, not hungry.  Eating subs.   -Sleep: Okay xanax at bedtime, works for about 4-5 hrs and then back up.    4 cigarettes smoking for a while, 21 mg patches, but has not used them yet.  Estrace, estradiol for 2 months.  Hot flashes.  2 months.  1 mg since hysterectomy.   Prior to Admission medications   Medication Sig Start Date End Date Taking? Authorizing Provider  ALPRAZolam Duanne Moron) 0.5 MG tablet Take 0.5 mg by mouth at bedtime as needed for sleep. 12/09/13   Robyn Haber, MD  aspirin 81 MG tablet Take 81 mg by mouth daily.    Historical Provider, MD  bisoprolol-hydrochlorothiazide [facility]) 5-6.25 MG per tablet Take 1 tablet by mouth daily. 12/09/13 12/09/14  Robyn Haber, MD  cholecalciferol (VITAMIN D) 1000 UNITS tablet Take 1,000 Units by mouth daily.    Historical Provider, MD  estradiol (ESTRACE) 1 MG tablet Take 1 mg by mouth daily.    Historical Provider, MD  fenofibrate 160 MG tablet Take 160 mg by mouth daily.    Historical Provider, MD  fexofenadine (ALLEGRA) 180 MG tablet Take 180 mg by mouth daily.    Historical Provider, MD  HYDROcodone-acetaminophen (NORCO) 5-325 MG per tablet Take 1 tablet by mouth every 6 (six) hours as needed for pain. 10/08/12   Darlyne Russian, MD  methocarbamol (ROBAXIN-750) 750 MG tablet Take 1 tablet (750 mg total) by mouth 4 (four) times daily. 02/14/14   Alta Corning, MD  omeprazole (PRILOSEC) 40 MG capsule Take 2 capsules (80 mg total) by mouth daily. 02/09/14   Minus Breeding, MD  pravastatin  (PRAVACHOL) 20 MG tablet Take 1 tablet (20 mg total) by mouth every evening. 02/06/14   Minus Breeding, MD    Allergies  Allergen Reactions  . Amlodipine   . Chantix [Varenicline Tartrate] Other (See Comments)    Tongue swell,sob  . Clarithromycin Rash  . Lisinopril Hives  . Simvastatin Hives  . Wellbutrin [Bupropion Hcl] Hives  . Lipitor [Atorvastatin]     Dizziness per patient    Past Medical History  Diagnosis Date  . Anxiety   . Depression   . Arthritis   . Hypertension   . Allergy   . Heart murmur     mild-moderate AR  . PVD (peripheral vascular disease)     moderate carotid disease  . Dyslipidemia   . RBBB   . Smoker   . Carpal tunnel syndrome     Past Surgical History  Procedure Laterality Date  . Spine surgery  08/16/2009  . Abdominal hysterectomy  1994  . Tubal ligation    . Colonoscopy w/ polypectomy  2009  . Hemorrhoid surgery    . Total knee arthroplasty Left 02/13/2014    Procedure: TOTAL KNEE ARTHROPLASTY;  Surgeon: Alta Corning, MD;  Location: [place];  Service: Orthopedics;  Laterality: Left;    ROS   Objective:   Vitals: There were no vitals taken  for this visit.  Physical Exam    No results found for this or any previous visit (from the past 24 hour(s)).   Assessment and Plan :  63 year old female with PMH listed above is here today for a three month follow up.  Had a lengthy discussion of the cardiovascular and cancer risk of continuing the estradiol.  Patient refuses to be off of the estrace.  Patient is willing to decrease the estrace to taking every other day with much counseling.  She is fully aware of the risk, and would rather be on the medication to help her qol.    Screening for metabolic disorder COMPLETE METABOLIC PANEL WITH GFR  Screening for deficiency anemia - Plan: CBC  Need for Tdap vaccination - Plan: Tdap vaccine greater than or equal to 7yo IM  Need for prophylactic vaccination against Streptococcus pneumoniae  (pneumococcus) - Plan: Pneumococcal conjugate vaccine 13-valent IM  Screening mammogram for high-risk patient Need for shingles vaccine  zoster vaccine live, PF, (ZOSTAVAX) 03212 UNT/0.65ML injection,  Screening for breast cancer - Plan: MM Digital Screening  At risk for bone density loss - Plan: DG Bone Density, cholecalciferol (VITAMIN D) 1000 UNITS tablet  Essential hypertension - Plan: bisoprolol-hydrochlorothiazide (ZIAC) 5-6.25 MG per tablet  Essential hypertension, benign  Hypertriglyceridemia - Plan: fenofibrate 160 MG tablet, pravastatin (PRAVACHOL) 20 MG tablet  Knee pain, bilateral  Anxiety - Plan: ALPRAZolam (XANAX) 0.5 MG tablet  Multiple allergies - Plan: fexofenadine (ALLEGRA) 180 MG tablet  Screening for colon cancer - Plan: Ambulatory referral to Gastroenterology   Ivar Drape, PA-C [facility] 12/1/20151:08 PM     Colonoscopy-name of provider  Estrace pneumo Need tdap pneumococcal Shingles Declines flu vaccine

## 2014-05-30 NOTE — Progress Notes (Addendum)
MRN: [id number] DOB: 07/06/1950  Subjective:   Hannah [name] is a 63 y.o. female with PMH listed below is here today for follow up.  She states that she has no current concerns at this time.    Knee Pain: She had her left knee replaced about four months ago, and reports that it is feeling better, and she is progressing with home physical therapy independently.  She is followed by ortho with this.    Hyperlipidemia/Atherosclerosis: She has returned to taking the statin therapy after one week non-compliant.  She initially stopped due to increase of pain and immobile stiffness that came on acutely.  She thought it was due to the statin, but after discussion of the benefits and no alternatives, she restarted 2 days ago.    Hormone Replacement: She is currently on estradiol 1mg , after her hysterectomy 20 years ago.  She attempted to stop taking the medication for 2 mos., but states that the mood changes/hotflashes/odor were so bad, that she could not stop taking the medication.  Diet: Eating subs; she has less of an appetite, and prefers to eat what she likes when she eats.     Sleep: Okay.  Xanax at bedtime, works for about 4-5 hrs and then back up.       Prior to Admission medications   Medication Sig Start Date End Date Taking? Authorizing Provider  ALPRAZolam Duanne Moron) 0.5 MG tablet Take 0.5 mg by mouth at bedtime as needed for sleep. 12/09/13   Robyn Haber, MD  aspirin 81 MG tablet Take 81 mg by mouth daily.    Historical Provider, MD  bisoprolol-hydrochlorothiazide [facility]) 5-6.25 MG per tablet Take 1 tablet by mouth daily. 12/09/13 12/09/14  Robyn Haber, MD  cholecalciferol (VITAMIN D) 1000 UNITS tablet Take 1,000 Units by mouth daily.    Historical Provider, MD  estradiol (ESTRACE) 1 MG tablet Take 1 mg by mouth daily.    Historical Provider, MD  fenofibrate 160 MG tablet Take 160 mg by mouth daily.    Historical Provider, MD  fexofenadine (ALLEGRA) 180 MG tablet Take 180 mg by  mouth daily.    Historical Provider, MD  HYDROcodone-acetaminophen (NORCO) 5-325 MG per tablet Take 1 tablet by mouth every 6 (six) hours as needed for pain. 10/08/12   Darlyne Russian, MD  methocarbamol (ROBAXIN-750) 750 MG tablet Take 1 tablet (750 mg total) by mouth 4 (four) times daily. 02/14/14   Alta Corning, MD  omeprazole (PRILOSEC) 40 MG capsule Take 2 capsules (80 mg total) by mouth daily. 02/09/14   Minus Breeding, MD  pravastatin (PRAVACHOL) 20 MG tablet Take 1 tablet (20 mg total) by mouth every evening. 02/06/14   Minus Breeding, MD    Allergies  Allergen Reactions  . Amlodipine   . Chantix [Varenicline Tartrate] Other (See Comments)    Tongue swell,sob  . Clarithromycin Rash  . Lisinopril Hives  . Simvastatin Hives  . Wellbutrin [Bupropion Hcl] Hives  . Lipitor [Atorvastatin]     Dizziness per patient    Past Medical History  Diagnosis Date  . Anxiety   . Depression   . Arthritis   . Hypertension   . Allergy   . Heart murmur     mild-moderate AR  . PVD (peripheral vascular disease)     moderate carotid disease  . Dyslipidemia   . RBBB   . Smoker   . Carpal tunnel syndrome     Past Surgical History  Procedure Laterality Date  .  Spine surgery  08/16/2009  . Abdominal hysterectomy  1994  . Tubal ligation    . Colonoscopy w/ polypectomy  2009  . Hemorrhoid surgery    . Total knee arthroplasty Left 02/13/2014    Procedure: TOTAL KNEE ARTHROPLASTY;  Surgeon: Alta Corning, MD;  Location: [place];  Service: Orthopedics;  Laterality: Left;    ROS  ROS otherwise unremarkable unless listed above Objective:   Vitals: BP 110/74 mmHg  Pulse 59  Temp(Src) 98 F (36.7 C) (Oral)  Resp 16  Ht 5\' 1"  (1.549 m)  Wt 121 lb 3.2 oz (54.976 kg)  BMI 22.91 kg/m2  SpO2 100%  Physical Exam  Constitutional: She is well-developed, well-nourished, and in no distress. No distress.  Eyes: Pupils are equal, round, and reactive to light.  Neck: Carotid bruit is present (1+  Carotid bruit appreciated bilaterally).  Cardiovascular: Regular rhythm.   Murmur heard. Pulses:      Radial pulses are 2+ on the right side, and 2+ on the left side.       Dorsalis pedis pulses are 2+ on the right side, and 2+ on the left side.  Pulmonary/Chest: Breath sounds normal. No accessory muscle usage. No apnea. No respiratory distress. She has no decreased breath sounds. She has no wheezes. She has no rhonchi.  Abdominal: Soft. Normal appearance. She exhibits no distension, no abdominal bruit and no mass. Bowel sounds are hyperactive. There is no splenomegaly or hepatomegaly. There is no tenderness.  Lymphadenopathy:       Head (right side): No submandibular and no occipital adenopathy present.       Head (left side): No submandibular and no occipital adenopathy present.    She has cervical adenopathy.       Right cervical: No posterior cervical adenopathy present.      Left cervical: No posterior cervical adenopathy present.       Right: No supraclavicular adenopathy present.       Left: No supraclavicular adenopathy present.  Skin: She is not diaphoretic.      Results for orders placed or performed in visit on 05/30/14 (from the past 24 hour(s))  COMPLETE METABOLIC PANEL WITH GFR     Status: Abnormal   Collection Time: 05/30/14 10:13 AM  Result Value Ref Range   Sodium 142 135 - 145 mEq/L   Potassium 4.4 3.5 - 5.3 mEq/L   Chloride 103 96 - 112 mEq/L   CO2 32 19 - 32 mEq/L   Glucose, Bld 109 (H) 70 - 99 mg/dL   BUN 7 6 - 23 mg/dL   Creat 0.86 0.50 - 1.10 mg/dL   Total Bilirubin 0.3 0.2 - 1.2 mg/dL   Alkaline Phosphatase 56 39 - 117 U/L   AST 19 0 - 37 U/L   ALT 11 0 - 35 U/L   Total Protein 6.7 6.0 - 8.3 g/dL   Albumin 4.3 3.5 - 5.2 g/dL   Calcium 10.0 8.4 - 10.5 mg/dL   GFR, Est African American 82 mL/min   GFR, Est Non African American 71 mL/min   Narrative   Performed at:  [place], [address]                 , [place] [address]  CBC     Status: None (Preliminary result)   Collection Time: 05/30/14 10:13 AM  Result Value Ref Range  WBC  4.0 - 10.5 K/uL   RBC  3.87 - 5.11 MIL/uL   Hemoglobin  12.0 - 15.0 g/dL   HCT  36.0 - 46.0 %   MCV  78.0 - 100.0 fL   MCH  26.0 - 34.0 pg   MCHC  30.0 - 36.0 g/dL   RDW  11.5 - 15.5 %   Platelets  150 - 400 K/uL   MPV  9.4 - 12.4 fL   Narrative   Performed at:  [address], [address]                [place], [place] [address]     Assessment and Plan :  63 year old female with PMH listed above is here today for a three month follow up.   -Had a lengthy discussion of the cardiovascular and cancer risk of continuing the estradiol.  Patient refuses to be off of the estrace.  Patient is willing to decrease the estrace to taking every other day with much counseling.  She is fully aware of the risk, and would rather be on the medication to help her qol.    Need for Tdap vaccination  Tdap vaccine greater than or equal to 7yo IM Need for prophylactic vaccination against Streptococcus pneumoniae (pneumococcus)  Pneumococcal conjugate vaccine 13-valent IM Need for shingles vaccine  zoster vaccine live, PF, (ZOSTAVAX) 12458 UNT/0.65ML injection, Screening for colon cancer  Ambulatory referral to Gastroenterology Screening mammogram for high-risk patient Screening for breast cancer MM Digital Screening Screening for metabolic disorder COMPLETE METABOLIC PANEL WITH GFR Screening for deficiency anemia CBC At risk for bone density loss  DG Bone Density, cholecalciferol (VITAMIN D) 1000 UNITS tablet  Essential hypertension  bisoprolol-hydrochlorothiazide (ZIAC) 5-6.25 MG per tablet  Hypertriglyceridemia  -Discussed appropriate diet with patient, both verbally and written handout.   fenofibrate 160 MG tablet, pravastatin (PRAVACHOL) 20 MG tablet  Knee pain, bilateral Stable, followed by ortho   Anxiety Stable    ALPRAZolam (XANAX) 0.5 MG tablet  Multiple allergies  fexofenadine (ALLEGRA) 180 MG tablet  Ivar Drape, PA-C [facility] 12/1/20152:19 PM     Colonoscopy-name of provider  Estrace pneumo Need tdap pneumococcal Shingles Declines flu vaccine

## 2014-05-30 NOTE — Patient Instructions (Signed)
Health Maintenance Adopting a healthy lifestyle and getting preventive care can go a long way to promote health and wellness. Talk with your health care provider about what schedule of regular examinations is right for you. This is a good chance for you to check in with your provider about disease prevention and staying healthy. In between checkups, there are plenty of things you can do on your own. Experts have done a lot of research about which lifestyle changes and preventive measures are most likely to keep you healthy. Ask your health care provider for more information. WEIGHT AND DIET  Eat a healthy diet  Be sure to include plenty of vegetables, fruits, low-fat dairy products, and lean protein.  Do not eat a lot of foods high in solid fats, added sugars, or salt.  Get regular exercise. This is one of the most important things you can do for your health.  Most adults should exercise for at least 150 minutes each week. The exercise should increase your heart rate and make you sweat (moderate-intensity exercise).  Most adults should also do strengthening exercises at least twice a week. This is in addition to the moderate-intensity exercise.  Maintain a healthy weight  Body mass index (BMI) is a measurement that can be used to identify possible weight problems. It estimates body fat based on height and weight. Your health care provider can help determine your BMI and help you achieve or maintain a healthy weight.  For females 25 years of age and older:   A BMI below 18.5 is considered underweight.  A BMI of 18.5 to 24.9 is normal.  A BMI of 25 to 29.9 is considered overweight.  A BMI of 30 and above is considered obese.  Watch levels of cholesterol and blood lipids  You should start having your blood tested for lipids and cholesterol at 65 years of age, then have this test every 5 years.  You may need to have your cholesterol levels checked more often if:  Your lipid or  cholesterol levels are high.  You are older than 65 years of age.  You are at high risk for heart disease.  CANCER SCREENING   Lung Cancer  Lung cancer screening is recommended for adults 97-92 years old who are at high risk for lung cancer because of a history of smoking.  A yearly low-dose CT scan of the lungs is recommended for people who:  Currently smoke.  Have quit within the past 15 years.  Have at least a 30-pack-year history of smoking. A pack year is smoking an average of one pack of cigarettes a day for 1 year.  Yearly screening should continue until it has been 15 years since you quit.  Yearly screening should stop if you develop a health problem that would prevent you from having lung cancer treatment.  Breast Cancer  Practice breast self-awareness. This means understanding how your breasts normally appear and feel.  It also means doing regular breast self-exams. Let your health care provider know about any changes, no matter how small.  If you are in your 20s or 30s, you should have a clinical breast exam (CBE) by a health care provider every 1-3 years as part of a regular health exam.  If you are 76 or older, have a CBE every year. Also consider having a breast X-ray (mammogram) every year.  If you have a family history of breast cancer, talk to your health care provider about genetic screening.  If you are  at high risk for breast cancer, talk to your health care provider about having an MRI and a mammogram every year.  Breast cancer gene (BRCA) assessment is recommended for women who have family members with BRCA-related cancers. BRCA-related cancers include:  Breast.  Ovarian.  Tubal.  Peritoneal cancers.  Results of the assessment will determine the need for genetic counseling and BRCA1 and BRCA2 testing. Cervical Cancer Routine pelvic examinations to screen for cervical cancer are no longer recommended for nonpregnant women who are considered low  risk for cancer of the pelvic organs (ovaries, uterus, and vagina) and who do not have symptoms. A pelvic examination may be necessary if you have symptoms including those associated with pelvic infections. Ask your health care provider if a screening pelvic exam is right for you.   The Pap test is the screening test for cervical cancer for women who are considered at risk.  If you had a hysterectomy for a problem that was not cancer or a condition that could lead to cancer, then you no longer need Pap tests.  If you are older than 65 years, and you have had normal Pap tests for the past 10 years, you no longer need to have Pap tests.  If you have had past treatment for cervical cancer or a condition that could lead to cancer, you need Pap tests and screening for cancer for at least 20 years after your treatment.  If you no longer get a Pap test, assess your risk factors if they change (such as having a new sexual partner). This can affect whether you should start being screened again.  Some women have medical problems that increase their chance of getting cervical cancer. If this is the case for you, your health care provider may recommend more frequent screening and Pap tests.  The human papillomavirus (HPV) test is another test that may be used for cervical cancer screening. The HPV test looks for the virus that can cause cell changes in the cervix. The cells collected during the Pap test can be tested for HPV.  The HPV test can be used to screen women 30 years of age and older. Getting tested for HPV can extend the interval between normal Pap tests from three to five years.  An HPV test also should be used to screen women of any age who have unclear Pap test results.  After 65 years of age, women should have HPV testing as often as Pap tests.  Colorectal Cancer  This type of cancer can be detected and often prevented.  Routine colorectal cancer screening usually begins at 65 years of  age and continues through 65 years of age.  Your health care provider may recommend screening at an earlier age if you have risk factors for colon cancer.  Your health care provider may also recommend using home test kits to check for hidden blood in the stool.  A small camera at the end of a tube can be used to examine your colon directly (sigmoidoscopy or colonoscopy). This is done to check for the earliest forms of colorectal cancer.  Routine screening usually begins at age 50.  Direct examination of the colon should be repeated every 5-10 years through 65 years of age. However, you may need to be screened more often if early forms of precancerous polyps or small growths are found. Skin Cancer  Check your skin from head to toe regularly.  Tell your health care provider about any new moles or changes in   moles, especially if there is a change in a mole's shape or color.  Also tell your health care provider if you have a mole that is larger than the size of a pencil eraser.  Always use sunscreen. Apply sunscreen liberally and repeatedly throughout the day.  Protect yourself by wearing long sleeves, pants, a wide-brimmed hat, and sunglasses whenever you are outside. HEART DISEASE, DIABETES, AND HIGH BLOOD PRESSURE   Have your blood pressure checked at least every 1-2 years. High blood pressure causes heart disease and increases the risk of stroke.  If you are between 75 years and 42 years old, ask your health care provider if you should take aspirin to prevent strokes.  Have regular diabetes screenings. This involves taking a blood sample to check your fasting blood sugar level.  If you are at a normal weight and have a low risk for diabetes, have this test once every three years after 65 years of age.  If you are overweight and have a high risk for diabetes, consider being tested at a younger age or more often. PREVENTING INFECTION  Hepatitis B  If you have a higher risk for  hepatitis B, you should be screened for this virus. You are considered at high risk for hepatitis B if:  You were born in a country where hepatitis B is common. Ask your health care provider which countries are considered high risk.  Your parents were born in a high-risk country, and you have not been immunized against hepatitis B (hepatitis B vaccine).  You have HIV or AIDS.  You use needles to inject street drugs.  You live with someone who has hepatitis B.  You have had sex with someone who has hepatitis B.  You get hemodialysis treatment.  You take certain medicines for conditions, including cancer, organ transplantation, and autoimmune conditions. Hepatitis C  Blood testing is recommended for:  Everyone born from [birth year] through 1967.  Anyone with known risk factors for hepatitis C. Sexually transmitted infections (STIs)  You should be screened for sexually transmitted infections (STIs) including gonorrhea and chlamydia if:  You are sexually active and are younger than 65 years of age.  You are older than 65 years of age and your health care provider tells you that you are at risk for this type of infection.  Your sexual activity has changed since you were last screened and you are at an increased risk for chlamydia or gonorrhea. Ask your health care provider if you are at risk.  If you do not have HIV, but are at risk, it may be recommended that you take a prescription medicine daily to prevent HIV infection. This is called pre-exposure prophylaxis (PrEP). You are considered at risk if:  You are sexually active and do not regularly use condoms or know the HIV status of your partner(s).  You take drugs by injection.  You are sexually active with a partner who has HIV. Talk with your health care provider about whether you are at high risk of being infected with HIV. If you choose to begin PrEP, you should first be tested for HIV. You should then be tested every 3 months for  as long as you are taking PrEP.  PREGNANCY   If you are premenopausal and you may become pregnant, ask your health care provider about preconception counseling.  If you may become pregnant, take 400 to 800 micrograms (mcg) of folic acid every day.  If you want to prevent pregnancy, talk to your  health care provider about birth control (contraception). OSTEOPOROSIS AND MENOPAUSE   Osteoporosis is a disease in which the bones lose minerals and strength with aging. This can result in serious bone fractures. Your risk for osteoporosis can be identified using a bone density scan.  If you are 62 years of age or older, or if you are at risk for osteoporosis and fractures, ask your health care provider if you should be screened.  Ask your health care provider whether you should take a calcium or vitamin D supplement to lower your risk for osteoporosis.  Menopause may have certain physical symptoms and risks.  Hormone replacement therapy may reduce some of these symptoms and risks. Talk to your health care provider about whether hormone replacement therapy is right for you.  HOME CARE INSTRUCTIONS   Schedule regular health, dental, and eye exams.  Stay current with your immunizations.   Do not use any tobacco products including cigarettes, chewing tobacco, or electronic cigarettes.  If you are pregnant, do not drink alcohol.  If you are breastfeeding, limit how much and how often you drink alcohol.  Limit alcohol intake to no more than 1 drink per day for nonpregnant women. One drink equals 12 ounces of beer, 5 ounces of wine, or 1 ounces of hard liquor.  Do not use street drugs.  Do not share needles.  Ask your health care provider for help if you need support or information about quitting drugs.  Tell your health care provider if you often feel depressed.  Tell your health care provider if you have ever been abused or do not feel safe at home. Document Released: 12/30/2010  Document Revised: 10/31/2013 Document Reviewed: 05/18/2013 [facility] Patient Information 2015 [place], [place]. This information is not intended to replace advice given to you by your health care provider. Make sure you discuss any questions you have with your health care provider.   High Cholesterol High cholesterol refers to having a high level of cholesterol in your blood. Cholesterol is a white, waxy, fat-like protein that your body needs in small amounts. Your liver makes all the cholesterol you need. Excess cholesterol comes from the food you eat. Cholesterol travels in your bloodstream through your blood vessels. If you have high cholesterol, deposits (plaque) may build up on the walls of your blood vessels. This makes the arteries narrower and stiffer. Plaque increases your risk of heart attack and stroke. Work with your health care provider to keep your cholesterol levels in a healthy range. RISK FACTORS Several things can make you more likely to have high cholesterol. These include:   Eating foods high in animal fat (saturated fat) or cholesterol.  Being overweight.  Not getting enough exercise.  Having a family history of high cholesterol. SIGNS AND SYMPTOMS High cholesterol does not cause symptoms. DIAGNOSIS  Your health care provider can do a blood test to check whether you have high cholesterol. If you are older than 20, your health care provider may check your cholesterol every 4-6 years. You may be checked more often if you already have high cholesterol or other risk factors for heart disease. The blood test for cholesterol measures the following:  Bad cholesterol (LDL cholesterol). This is the type of cholesterol that causes heart disease. This number should be less than 100.  Good cholesterol (HDL cholesterol). This type helps protect against heart disease. A healthy level of HDL cholesterol is 60 or higher.  Total cholesterol. This is the combined number of LDL cholesterol  and  HDL cholesterol. A healthy number is less than 200. TREATMENT  High cholesterol can be treated with diet changes, lifestyle changes, and medicine.   Diet changes may include eating more whole grains, fruits, vegetables, nuts, and fish. You may also have to cut back on red meat and foods with a lot of added sugar.  Lifestyle changes may include getting at least 40 minutes of aerobic exercise three times a week. Aerobic exercises include walking, biking, and swimming. Aerobic exercise along with a healthy diet can help you maintain a healthy weight. Lifestyle changes may also include quitting smoking.  If diet and lifestyle changes are not enough to lower your cholesterol, your health care provider may prescribe a statin medicine. This medicine has been shown to lower cholesterol and also lower the risk of heart disease. HOME CARE INSTRUCTIONS  Only take over-the-counter or prescription medicines as directed by your health care provider.   Follow a healthy diet as directed by your health care provider. For instance:   Eat chicken (without skin), fish, veal, shellfish, ground [place] breast, and round or loin cuts of red meat.  Do not eat fried foods and fatty meats, such as hot dogs and salami.   Eat plenty of fruits, such as apples.   Eat plenty of vegetables, such as broccoli, potatoes, and carrots.   Eat beans, peas, and lentils.   Eat grains, such as barley, rice, couscous, and bulgur wheat.   Eat pasta without cream sauces.   Use skim or nonfat milk and low-fat or nonfat yogurt and cheeses. Do not eat or drink whole milk, cream, ice cream, egg yolks, and hard cheeses.   Do not eat stick margarine or tub margarines that contain trans fats (also called partially hydrogenated oils).   Do not eat cakes, cookies, crackers, or other baked goods that contain trans fats.   Do not eat saturated tropical oils, such as coconut and palm oil.   Exercise as directed by your  health care provider. Increase your activity level with activities such as gardening or walking.   Keep all follow-up appointments.  SEEK MEDICAL CARE IF:  You are struggling to maintain a healthy diet or weight.  You need help starting an exercise program.  You need help to stop smoking. SEEK IMMEDIATE MEDICAL CARE IF:  You have chest pain.  You have trouble breathing. Document Released: 06/16/2005 Document Revised: 10/31/2013 Document Reviewed: 04/08/2013 [facility] Patient Information 2015 [name], [place]. This information is not intended to replace advice given to you by your health care provider. Make sure you discuss any questions you have with your health care provider.

## 2014-06-02 ENCOUNTER — Telehealth: Payer: Self-pay | Admitting: Emergency Medicine

## 2014-06-02 ENCOUNTER — Other Ambulatory Visit: Payer: Self-pay | Admitting: Emergency Medicine

## 2014-06-02 DIAGNOSIS — Z9189 Other specified personal risk factors, not elsewhere classified: Secondary | ICD-10-CM

## 2014-06-02 DIAGNOSIS — Z1231 Encounter for screening mammogram for malignant neoplasm of breast: Secondary | ICD-10-CM

## 2014-06-02 NOTE — Telephone Encounter (Signed)
Call patient and let her know her hemoglobin is improved from 3 months ago. She still needs to follow-up with the cardiologist and GI specialist.

## 2014-06-05 NOTE — Telephone Encounter (Signed)
LM for rtn call. 

## 2014-06-06 NOTE — Telephone Encounter (Signed)
Pt has been advised and will be calling to schedule these appt.

## 2014-06-13 ENCOUNTER — Ambulatory Visit (HOSPITAL_COMMUNITY)
Admission: RE | Admit: 2014-06-13 | Discharge: 2014-06-13 | Disposition: A | Discharge status: Home / Self Care | Payer: Commercial Managed Care - HMO | Source: Ambulatory Visit | Attending: Cardiology | Admitting: Cardiology

## 2014-06-13 DIAGNOSIS — I779 Disorder of arteries and arterioles, unspecified: Secondary | ICD-10-CM

## 2014-06-13 DIAGNOSIS — I6523 Occlusion and stenosis of bilateral carotid arteries: Secondary | ICD-10-CM | POA: Diagnosis not present

## 2014-06-13 DIAGNOSIS — I4510 Unspecified right bundle-branch block: Secondary | ICD-10-CM | POA: Diagnosis present

## 2014-06-13 DIAGNOSIS — I739 Peripheral vascular disease, unspecified: Secondary | ICD-10-CM

## 2014-06-13 NOTE — Progress Notes (Signed)
Carotid Duplex Completed. Stable bilateral 50-69% stenosis. [name], BS, RDMS, RVT

## 2014-06-15 ENCOUNTER — Telehealth: Payer: Self-pay

## 2014-06-15 NOTE — Telephone Encounter (Signed)
Spoke to patient regarding flu shot.  Patient states she does not take the flu shot.

## 2014-06-19 ENCOUNTER — Telehealth: Payer: Self-pay | Admitting: Cardiology

## 2014-06-19 NOTE — Telephone Encounter (Signed)
Spoke with pt.  She states she has been able to continue pravastatin 20mg  daily with no problems.

## 2014-06-19 NOTE — Telephone Encounter (Signed)
New Msg    Pt calling, states she is still taking medication pravastatin 20 mg. States she wanted to get this info to [address] in case she runs out.

## 2014-07-14 ENCOUNTER — Encounter: Payer: Self-pay | Admitting: Internal Medicine

## 2014-08-30 ENCOUNTER — Telehealth: Payer: Self-pay | Admitting: Physician Assistant

## 2014-08-30 NOTE — Telephone Encounter (Signed)
Omeprazole 40mg  needs a prior authorization to be able to maintain getting this drug.  Please address

## 2014-08-31 NOTE — Telephone Encounter (Signed)
I spoke with Dr Everlene Farrier about this a couple of weeks ago. She is on Omeprazole 40mg  but she takes two a day to equal 80mg . Dr Everlene Farrier states he did not prescribe her that dose and he believes her GI doctor.did, so they would have to do the authorization. I tried to call patient.

## 2014-08-31 NOTE — Telephone Encounter (Signed)
I called the pharmacy and they say they are not getting a reject message, it is just saying the refill is too soon.

## 2014-09-06 ENCOUNTER — Telehealth: Payer: Self-pay

## 2014-09-06 DIAGNOSIS — I10 Essential (primary) hypertension: Secondary | ICD-10-CM

## 2014-09-06 DIAGNOSIS — E781 Pure hyperglyceridemia: Secondary | ICD-10-CM

## 2014-09-06 DIAGNOSIS — F419 Anxiety disorder, unspecified: Secondary | ICD-10-CM

## 2014-09-06 NOTE — Telephone Encounter (Signed)
Rx's pended.

## 2014-09-06 NOTE — Telephone Encounter (Signed)
Duplicate

## 2014-09-06 NOTE — Telephone Encounter (Signed)
Refill of estridiol, fenofibrate, xanax, bisoprolol, omeprazole, and pravastatin. She is requesting that we mail them to her home.

## 2014-09-07 NOTE — Telephone Encounter (Signed)
See if she can come in to see me this weekend so we can review all of her medications. She saw the PA last visit and I need to talk with her and make sure we refill her medications appropriately. I believe she saw the PA [place] her last visit. It would also be great if she could come in and discuss with her her medications.

## 2014-09-08 NOTE — Telephone Encounter (Signed)
Spoke to Hannah [name] and she is planning to RTC this weekend.

## 2014-09-09 ENCOUNTER — Ambulatory Visit (INDEPENDENT_AMBULATORY_CARE_PROVIDER_SITE_OTHER): Payer: Commercial Managed Care - HMO | Admitting: Family Medicine

## 2014-09-09 VITALS — BP 104/58 | HR 62 | Temp 97.9°F | Resp 12 | Ht 61.0 in | Wt 120.2 lb

## 2014-09-09 DIAGNOSIS — F419 Anxiety disorder, unspecified: Secondary | ICD-10-CM

## 2014-09-09 DIAGNOSIS — K219 Gastro-esophageal reflux disease without esophagitis: Secondary | ICD-10-CM | POA: Diagnosis not present

## 2014-09-09 DIAGNOSIS — M1711 Unilateral primary osteoarthritis, right knee: Secondary | ICD-10-CM

## 2014-09-09 DIAGNOSIS — N951 Menopausal and female climacteric states: Secondary | ICD-10-CM | POA: Diagnosis not present

## 2014-09-09 DIAGNOSIS — I10 Essential (primary) hypertension: Secondary | ICD-10-CM

## 2014-09-09 DIAGNOSIS — E781 Pure hyperglyceridemia: Secondary | ICD-10-CM

## 2014-09-09 MED ORDER — ALPRAZOLAM 0.5 MG PO TABS: 0.5000 mg | ORAL_TABLET | Freq: Every evening | ORAL | Status: DC | PRN

## 2014-09-09 MED ORDER — ALPRAZOLAM 0.5 MG PO TABS
0.5000 mg | ORAL_TABLET | Freq: Every evening | ORAL | Status: DC | PRN
Start: 2014-09-09 — End: 2015-02-23

## 2014-09-09 MED ORDER — BISOPROLOL-HYDROCHLOROTHIAZIDE 5-6.25 MG PO TABS: 1.0000 | ORAL_TABLET | Freq: Every day | ORAL | Status: DC

## 2014-09-09 MED ORDER — FENOFIBRATE 160 MG PO TABS: 160.0000 mg | ORAL_TABLET | Freq: Every day | ORAL | Status: DC

## 2014-09-09 MED ORDER — HYDROCODONE-ACETAMINOPHEN 5-325 MG PO TABS: 1.0000 | ORAL_TABLET | Freq: Four times a day (QID) | ORAL | Status: DC | PRN

## 2014-09-09 MED ORDER — ESTRADIOL 1 MG PO TABS
1.0000 mg | ORAL_TABLET | ORAL | Status: DC
Start: 2014-09-09 — End: 2015-07-25

## 2014-09-09 MED ORDER — PRAVASTATIN SODIUM 20 MG PO TABS: 20.0000 mg | ORAL_TABLET | Freq: Every evening | ORAL | Status: DC

## 2014-09-09 MED ORDER — OMEPRAZOLE 40 MG PO CPDR: 80.0000 mg | DELAYED_RELEASE_CAPSULE | Freq: Every day | ORAL | Status: DC

## 2014-09-09 MED ORDER — METHOCARBAMOL 750 MG PO TABS: 750.0000 mg | ORAL_TABLET | Freq: Four times a day (QID) | ORAL | Status: DC

## 2014-09-09 NOTE — Progress Notes (Signed)
   Subjective:    Patient ID: Hannah [name], female    DOB: 04/01/1951, 64 y.o.   MRN: [id number] This chart was scribed for Robyn Haber, MD by Zola Button, Medical Scribe. This patient was seen in Room 11 and the patient's care was started at 12:19 PM.   HPI HPI Comments: Hannah [name] is a 64 y.o. female with a hx of arthritis and HTN who presents to the [facility] for a medication refill. She gets her medications in the mail. Patient wants to get the shingles vaccine. She had her left knee replaced in August, 2015 and may also get her right knee replaced. She has been having some constipation that she thinks may be due to the medications she is on. Patient denies chest pain and difficulty breathing. She does smoke, but is working on quitting and has been using nicotine patches. She recently joined the [employer].  Patient is retired.  Review of Systems  Respiratory: Negative for shortness of breath.   Cardiovascular: Negative for chest pain.  Gastrointestinal: Positive for constipation.       Objective:   Physical Exam CONSTITUTIONAL: Well developed/well nourished HEAD: Normocephalic/atraumatic EYES: EOM/PERRL ENMT: Mucous membranes moist NECK: supple no meningeal signs SPINE: entire spine nontender ABDOMEN: soft, nontender, no rebound or guarding GU: no cva tenderness NEURO: Pt is awake/alert, moves all extremitiesx4 EXTREMITIES: pulses normal, full ROM. 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 Subluxed MCP joints of the thumbs. SKIN: warm, color normal PSYCH: no abnormalities of mood noted        Assessment & Plan:   This chart was scribed in my presence and reviewed by me personally.    ICD-9-CM ICD-10-CM   1. Anxiety 300.00 F41.9 ALPRAZolam (XANAX) 0.5 MG tablet     DISCONTINUED: ALPRAZolam (XANAX) 0.5 MG tablet  2. Essential hypertension 401.9 I10 bisoprolol-hydrochlorothiazide (ZIAC) 5-6.25 MG per tablet  3.  Hypertriglyceridemia 272.1 E78.1 fenofibrate 160 MG tablet     pravastatin (PRAVACHOL) 20 MG tablet  4. Menopausal symptoms 627.2 N95.1 estradiol (ESTRACE) 1 MG tablet  5. Primary osteoarthritis of right knee 715.16 M17.11 HYDROcodone-acetaminophen (NORCO) 5-325 MG per tablet     methocarbamol (ROBAXIN-750) 750 MG tablet  6. Gastroesophageal reflux disease without esophagitis 530.81 K21.9 omeprazole (PRILOSEC) 40 MG capsule     Signed, Robyn Haber, MD

## 2014-11-07 NOTE — Telephone Encounter (Signed)
Please check with Dr. Joseph Art. He saw her in March and be sure the these in the medications that he wants her to be on.

## 2014-11-09 ENCOUNTER — Other Ambulatory Visit (INDEPENDENT_AMBULATORY_CARE_PROVIDER_SITE_OTHER): Payer: Commercial Managed Care - HMO

## 2014-11-09 ENCOUNTER — Other Ambulatory Visit: Payer: Commercial Managed Care - HMO

## 2014-11-09 DIAGNOSIS — E782 Mixed hyperlipidemia: Secondary | ICD-10-CM

## 2014-11-09 LAB — HEPATIC FUNCTION PANEL
ALT: 12 U/L (ref 0–35)
AST: 20 U/L (ref 0–37)
Albumin: 4 g/dL (ref 3.5–5.2)
Alkaline Phosphatase: 59 U/L (ref 39–117)
Bilirubin, Direct: 0.1 mg/dL (ref 0.0–0.3)
Total Bilirubin: 0.3 mg/dL (ref 0.2–1.2)
Total Protein: 6.5 g/dL (ref 6.0–8.3)

## 2014-11-09 LAB — LIPID PANEL
Cholesterol: 177 mg/dL (ref 0–200)
HDL: 35.6 mg/dL — ABNORMAL LOW (ref >39.00)
NonHDL: 141.4
Total CHOL/HDL Ratio: 5
Triglycerides: 246 mg/dL — ABNORMAL HIGH (ref 0.0–149.0)
VLDL: 49.2 mg/dL — ABNORMAL HIGH (ref 0.0–40.0)

## 2014-11-09 LAB — LDL CHOLESTEROL, DIRECT: Direct LDL: 87 mg/dL

## 2014-11-14 ENCOUNTER — Ambulatory Visit: Payer: Commercial Managed Care - HMO | Admitting: Pharmacist

## 2014-11-15 ENCOUNTER — Other Ambulatory Visit: Payer: Commercial Managed Care - HMO

## 2014-12-05 ENCOUNTER — Encounter: Payer: Self-pay | Admitting: Emergency Medicine

## 2014-12-05 ENCOUNTER — Ambulatory Visit (INDEPENDENT_AMBULATORY_CARE_PROVIDER_SITE_OTHER): Payer: Commercial Managed Care - HMO | Admitting: Emergency Medicine

## 2014-12-05 VITALS — BP 111/65 | HR 55 | Temp 98.1°F | Resp 16 | Ht 62.5 in | Wt 124.0 lb

## 2014-12-05 DIAGNOSIS — R490 Dysphonia: Secondary | ICD-10-CM

## 2014-12-05 DIAGNOSIS — E781 Pure hyperglyceridemia: Secondary | ICD-10-CM | POA: Diagnosis not present

## 2014-12-05 DIAGNOSIS — I10 Essential (primary) hypertension: Secondary | ICD-10-CM | POA: Diagnosis not present

## 2014-12-05 DIAGNOSIS — Z720 Tobacco use: Secondary | ICD-10-CM

## 2014-12-05 DIAGNOSIS — I6529 Occlusion and stenosis of unspecified carotid artery: Secondary | ICD-10-CM | POA: Diagnosis not present

## 2014-12-05 DIAGNOSIS — Z1211 Encounter for screening for malignant neoplasm of colon: Secondary | ICD-10-CM

## 2014-12-05 DIAGNOSIS — J301 Allergic rhinitis due to pollen: Secondary | ICD-10-CM

## 2014-12-05 DIAGNOSIS — F17200 Nicotine dependence, unspecified, uncomplicated: Secondary | ICD-10-CM

## 2014-12-05 LAB — BASIC METABOLIC PANEL WITH GFR
BUN: 7 mg/dL (ref 6–23)
CO2: 27 mEq/L (ref 19–32)
Calcium: 10 mg/dL (ref 8.4–10.5)
Chloride: 105 mEq/L (ref 96–112)
Creat: 0.8 mg/dL (ref 0.50–1.10)
GFR, Est African American: 89 mL/min
GFR, Est Non African American: 77 mL/min
Glucose, Bld: 112 mg/dL — ABNORMAL HIGH (ref 70–99)
Potassium: 3.6 mEq/L (ref 3.5–5.3)
Sodium: 141 mEq/L (ref 135–145)

## 2014-12-05 LAB — CBC WITH DIFFERENTIAL/PLATELET
Basophils Absolute: 0 10*3/uL (ref 0.0–0.1)
Basophils Relative: 0 % (ref 0–1)
Eosinophils Absolute: 0.2 10*3/uL (ref 0.0–0.7)
Eosinophils Relative: 4 % (ref 0–5)
HCT: 37.3 % (ref 36.0–46.0)
Hemoglobin: 12.2 g/dL (ref 12.0–15.0)
Lymphocytes Relative: 41 % (ref 12–46)
Lymphs Abs: 2.3 10*3/uL (ref 0.7–4.0)
MCH: 26.6 pg (ref 26.0–34.0)
MCHC: 32.7 g/dL (ref 30.0–36.0)
MCV: 81.3 fL (ref 78.0–100.0)
MPV: 9.8 fL (ref 8.6–12.4)
Monocytes Absolute: 0.4 10*3/uL (ref 0.1–1.0)
Monocytes Relative: 7 % (ref 3–12)
Neutro Abs: 2.7 10*3/uL (ref 1.7–7.7)
Neutrophils Relative %: 48 % (ref 43–77)
Platelets: 297 10*3/uL (ref 150–400)
RBC: 4.59 MIL/uL (ref 3.87–5.11)
RDW: 13.5 % (ref 11.5–15.5)
WBC: 5.7 10*3/uL (ref 4.0–10.5)

## 2014-12-05 LAB — LIPID PANEL
Cholesterol: 168 mg/dL (ref 0–200)
HDL: 34 mg/dL — ABNORMAL LOW (ref >=46)
LDL Cholesterol: 91 mg/dL (ref 0–99)
Total CHOL/HDL Ratio: 4.9 Ratio
Triglycerides: 216 mg/dL — ABNORMAL HIGH (ref <150)
VLDL: 43 mg/dL — ABNORMAL HIGH (ref 0–40)

## 2014-12-05 MED ORDER — OLOPATADINE HCL 0.2 % OP SOLN
1.0000 [drp] | OPHTHALMIC | Status: DC
Start: 2014-12-05 — End: 2014-12-08

## 2014-12-05 NOTE — Progress Notes (Signed)
Subjective:  This chart was scribed for Darlyne Russian, MD by Tamsen Roers, at [facility]  This patient was seen in room 23 and the patient's care was started at 9:27 AM.     Patient ID: Hannah [name], female    DOB: 10-May-1951, 64 y.o.   MRN: [id number] Chief Complaint  Patient presents with  . Follow-up  . Hypertension    HPI  HPI Comments: Hannah [name] is a 64 y.o. female who presents to the [facility] for a follow up.  Patient states that her blood pressure medication works very well for her. She has a loss of appetite recently and states that she "just doesn't feel hungry". She is also having itchy and watery eyes recently.  She is going to go to the pharmacy to get her shingles shot.  She is up to date with her pneumonia vaccination.  She has not yet gotten her colonoscopy but is willing to go if she can have an appointment set up (prefers it to be on a Friday). She has not quit smoking yet (says she smokes "alot") and is taking estradiol every other day.  She has tried patches in order to quit but states that she feels like a "nervous wreck" after she takes them off. She has no other questions or concerns today.       Past Medical History  Diagnosis Date  . Anxiety   . Depression   . Arthritis   . Hypertension   . Allergy   . Heart murmur     mild-moderate AR  . PVD (peripheral vascular disease)     moderate carotid disease  . Dyslipidemia   . RBBB   . Smoker   . Carpal tunnel syndrome     Current Outpatient Prescriptions on File Prior to Visit  Medication Sig Dispense Refill  . ALPRAZolam (XANAX) 0.5 MG tablet Take 1 tablet (0.5 mg total) by mouth at bedtime as needed for sleep. 30 tablet 1  . aspirin 81 MG tablet Take 81 mg by mouth daily.    . bisoprolol-hydrochlorothiazide (ZIAC) 5-6.25 MG per tablet Take 1 tablet by mouth daily. 90 tablet 3  . cholecalciferol (VITAMIN D) 1000 UNITS tablet Take 1 tablet (1,000  Units total) by mouth daily. 30 tablet 5  . estradiol (ESTRACE) 1 MG tablet Take 1 tablet (1 mg total) by mouth every other day. 90 tablet 3  . fenofibrate 160 MG tablet Take 1 tablet (160 mg total) by mouth daily. 90 tablet 3  . fexofenadine (ALLEGRA) 180 MG tablet Take 1 tablet (180 mg total) by mouth daily. 30 tablet 5  . HYDROcodone-acetaminophen (NORCO) 5-325 MG per tablet Take 1 tablet by mouth every 6 (six) hours as needed. 90 tablet 0  . methocarbamol (ROBAXIN-750) 750 MG tablet Take 1 tablet (750 mg total) by mouth 4 (four) times daily. 60 tablet 3  . omeprazole (PRILOSEC) 40 MG capsule Take 2 capsules (80 mg total) by mouth daily. 90 capsule 3  . pravastatin (PRAVACHOL) 20 MG tablet Take 1 tablet (20 mg total) by mouth every evening. 90 tablet 3  . zoster vaccine live, PF, (ZOSTAVAX) 37048 UNT/0.65ML injection Inject 19,400 Units into the skin once. 1 each 0   No current facility-administered medications on file prior to visit.    Allergies  Allergen Reactions  . Amlodipine   . Chantix [Varenicline Tartrate] Other (See Comments)    Tongue swell,sob  . Clarithromycin  Rash  . Lisinopril Hives  . Simvastatin Hives  . Wellbutrin [Bupropion Hcl] Hives  . Lipitor [Atorvastatin]     Dizziness per patient    Recent Results (from the past 2160 hour(s))  Lipid panel     Status: Abnormal   Collection Time: 11/09/14  8:19 AM  Result Value Ref Range   Cholesterol 177 0 - 200 mg/dL    Comment: ATP III Classification       Desirable:  < 200 mg/dL               Borderline High:  200 - 239 mg/dL          High:  > = 240 mg/dL   Triglycerides 246.0 (H) 0.0 - 149.0 mg/dL    Comment: Normal:  <150 mg/dLBorderline High:  150 - 199 mg/dL   HDL 35.60 (L) >39.00 mg/dL   VLDL 49.2 (H) 0.0 - 40.0 mg/dL   Total CHOL/HDL Ratio 5     Comment:                Men          Women1/2 Average Risk     3.4          3.3Average Risk          5.0          4.42X Average Risk          9.6          7.13X Average  Risk          15.0          11.0                       NonHDL 141.40     Comment: NOTE:  Non-HDL goal should be 30 mg/dL higher than patient's LDL goal (i.e. LDL goal of < 70 mg/dL, would have non-HDL goal of < 100 mg/dL)  Hepatic function panel     Status: None   Collection Time: 11/09/14  8:19 AM  Result Value Ref Range   Total Bilirubin 0.3 0.2 - 1.2 mg/dL   Bilirubin, Direct 0.1 0.0 - 0.3 mg/dL   Alkaline Phosphatase 59 39 - 117 U/L   AST 20 0 - 37 U/L   ALT 12 0 - 35 U/L   Total Protein 6.5 6.0 - 8.3 g/dL   Albumin 4.0 3.5 - 5.2 g/dL  LDL cholesterol, direct     Status: None   Collection Time: 11/09/14  8:19 AM  Result Value Ref Range   Direct LDL 87.0 mg/dL    Comment: Optimal:  <100 mg/dLNear or Above Optimal:  100-129 mg/dLBorderline High:  130-159 mg/dLHigh:  160-189 mg/dLVery High:  >190 mg/dL    Review of Systems  Constitutional: Positive for appetite change. Negative for fever and chills.  Eyes: Positive for itching.  Respiratory: Negative for choking and shortness of breath.   Gastrointestinal: Negative for nausea and vomiting.  Musculoskeletal: Negative for neck pain and neck stiffness.       Objective:   Physical Exam  CONSTITUTIONAL: Well developed/well nourished HEAD: Normocephalic/atraumatic EYES: EOMI/PERRL ENMT: Mucous membranes moist NECK: supple no meningeal signs SPINE/BACK:entire spine nontender CV: S1/S2 noted, no murmurs/rubs/gallops noted LUNGS: Lungs are clear to auscultation bilaterally, no apparent distress, her voice is somewhat hoarse.  ABDOMEN: soft, nontender, no rebound or guarding, bowel sounds noted throughout abdomen GU:no cva tenderness NEURO: Pt is awake/alert/appropriate, moves all extremitiesx4.  No facial  droop.   EXTREMITIES: pulses normal/equal, full ROM SKIN: warm, color normal PSYCH: no abnormalities of mood noted, alert and oriented to situation  Filed Vitals:   12/05/14 0913  BP: 111/65  Pulse: 55  Temp: 98.1 F (36.7  C)  Resp: 16  Height: 5' 2.5" (1.588 m)  Weight: 124 lb (56.246 kg)  SpO2: 99%       Assessment & Plan:  1. Essential hypertension Blood pressure 111/65 at goal - CBC with Differential/Platelet - BASIC METABOLIC PANEL WITH GFR  2. Smoker She is going to try losenges to see if she can cut back on her smoking.  3. Hoarseness of voice  - Ambulatory referral to ENT  4. Hypertriglyceridemia  - Lipid panel  5. Screening for colon cancer  - Ambulatory referral to Gastroenterology  6. Carotid stenosis, unspecified laterality This is followed by Dr. Percival Spanish  7. Allergic rhinitis due to pollen She will continue over-the-counter anti-histamines. - Olopatadine HCl (PATADAY) 0.2 % SOLN; Apply 1 drop to eye 1 day or 1 dose.  Dispense: 2.5 mL; Refill: 11   I personally performed the services described in this documentation, which was scribed in my presence. The recorded information has been reviewed and is accurate.  [name], MD  [facility]  12/05/2014 9:51 AM

## 2014-12-08 ENCOUNTER — Other Ambulatory Visit: Payer: Self-pay

## 2014-12-08 DIAGNOSIS — J301 Allergic rhinitis due to pollen: Secondary | ICD-10-CM

## 2014-12-08 MED ORDER — OLOPATADINE HCL 0.2 % OP SOLN: OPHTHALMIC | Status: DC

## 2014-12-08 NOTE — Telephone Encounter (Signed)
Pharm sent req for clarification of instr's. Checked w/Dr Everlene Farrier who asked me to send in this new Rx.

## 2014-12-11 ENCOUNTER — Encounter: Payer: Self-pay | Admitting: *Deleted

## 2014-12-15 ENCOUNTER — Other Ambulatory Visit: Payer: Self-pay | Admitting: Cardiology

## 2014-12-15 ENCOUNTER — Telehealth (HOSPITAL_COMMUNITY): Payer: Self-pay | Admitting: *Deleted

## 2014-12-15 DIAGNOSIS — I739 Peripheral vascular disease, unspecified: Secondary | ICD-10-CM

## 2014-12-16 ENCOUNTER — Telehealth: Payer: Self-pay | Admitting: Emergency Medicine

## 2014-12-16 NOTE — Telephone Encounter (Signed)
Call patient and tell her she needs to have her follow-up carotid Doppler. She also needs to go ahead and have her mammogram.

## 2014-12-16 NOTE — Telephone Encounter (Signed)
Dr. Everlene Farrier please advise.

## 2014-12-17 NOTE — Telephone Encounter (Signed)
Pot to merit. It looks like she had her carotid Doppler but I cannot see that she had her mammogram. Please double check on this. I did not see a response to my note.

## 2014-12-18 ENCOUNTER — Telehealth: Payer: Self-pay

## 2014-12-18 ENCOUNTER — Other Ambulatory Visit: Payer: Self-pay | Admitting: Family Medicine

## 2014-12-18 ENCOUNTER — Other Ambulatory Visit: Payer: Self-pay | Admitting: Emergency Medicine

## 2014-12-18 DIAGNOSIS — M1711 Unilateral primary osteoarthritis, right knee: Secondary | ICD-10-CM

## 2014-12-18 MED ORDER — HYDROCODONE-ACETAMINOPHEN 5-325 MG PO TABS: 1.0000 | ORAL_TABLET | Freq: Four times a day (QID) | ORAL | Status: DC | PRN

## 2014-12-18 NOTE — Telephone Encounter (Signed)
Pharm faxed copy of 09/09/14 Rx for hydrocodone that reported that they filled this for only #30 on 11/01/14, and that #60 were still outstanding. However, they can not use the same Rx to fill the other #60. They need a new Rx. I have pended for the other #60.

## 2014-12-19 NOTE — Telephone Encounter (Signed)
Spoke with pt, advised message from Dr. Everlene Farrier. Pt understood. She will get the tests done. Sorry about the last message it was an error.

## 2015-01-02 DIAGNOSIS — Z1231 Encounter for screening mammogram for malignant neoplasm of breast: Secondary | ICD-10-CM | POA: Diagnosis not present

## 2015-01-10 ENCOUNTER — Other Ambulatory Visit: Payer: Self-pay | Admitting: Cardiology

## 2015-01-10 DIAGNOSIS — I739 Peripheral vascular disease, unspecified: Secondary | ICD-10-CM

## 2015-01-10 DIAGNOSIS — I6523 Occlusion and stenosis of bilateral carotid arteries: Secondary | ICD-10-CM

## 2015-01-15 ENCOUNTER — Ambulatory Visit (HOSPITAL_COMMUNITY)
Admission: RE | Admit: 2015-01-15 | Discharge: 2015-01-15 | Disposition: A | Discharge status: Home / Self Care | Payer: Commercial Managed Care - HMO | Source: Ambulatory Visit | Attending: Cardiology | Admitting: Cardiology

## 2015-01-15 ENCOUNTER — Encounter: Payer: Self-pay | Admitting: Emergency Medicine

## 2015-01-15 DIAGNOSIS — I739 Peripheral vascular disease, unspecified: Secondary | ICD-10-CM | POA: Insufficient documentation

## 2015-01-15 DIAGNOSIS — I6523 Occlusion and stenosis of bilateral carotid arteries: Secondary | ICD-10-CM

## 2015-01-15 NOTE — Progress Notes (Signed)
Carotid Duplex Completed. Stable 50-69% bilateral ICA stenosis.  Oda Cogan, BS, RDMS, RVT

## 2015-02-13 ENCOUNTER — Telehealth: Payer: Self-pay

## 2015-02-13 NOTE — Telephone Encounter (Signed)
Pt states her referral to Dr Allyson Sabal have expired and she have an appt with him in Sept but need another referral updated. Please call pt [phone]

## 2015-02-13 NOTE — Telephone Encounter (Signed)
[employer] referral.

## 2015-02-23 ENCOUNTER — Other Ambulatory Visit: Payer: Self-pay | Admitting: Family Medicine

## 2015-02-28 ENCOUNTER — Telehealth: Payer: Self-pay | Admitting: *Deleted

## 2015-02-28 NOTE — Telephone Encounter (Signed)
What pharmacy does she want prescription sent to

## 2015-03-06 DIAGNOSIS — L700 Acne vulgaris: Secondary | ICD-10-CM | POA: Diagnosis not present

## 2015-03-06 NOTE — Telephone Encounter (Signed)
Faxed xanax RF

## 2015-03-06 NOTE — Telephone Encounter (Signed)
Faxed to [facility] who req'd the RF. Notified pt on VM

## 2015-04-03 ENCOUNTER — Encounter: Payer: Self-pay | Admitting: Emergency Medicine

## 2015-04-17 DIAGNOSIS — R490 Dysphonia: Secondary | ICD-10-CM | POA: Diagnosis not present

## 2015-04-17 DIAGNOSIS — J381 Polyp of vocal cord and larynx: Secondary | ICD-10-CM | POA: Diagnosis not present

## 2015-04-17 DIAGNOSIS — K219 Gastro-esophageal reflux disease without esophagitis: Secondary | ICD-10-CM | POA: Diagnosis not present

## 2015-04-17 DIAGNOSIS — Z720 Tobacco use: Secondary | ICD-10-CM | POA: Diagnosis not present

## 2015-05-08 ENCOUNTER — Telehealth: Payer: Self-pay

## 2015-05-08 NOTE — Telephone Encounter (Signed)
[employer] referral completed.  Authorization number [id number].  Valid 05/08/2015 - 11/04/2015.  6 visits.

## 2015-05-08 NOTE — Telephone Encounter (Signed)
[employer] referral  Diagnosis    293.83 (ICD-9-CM) - Mood disorder in conditions classified elsewhere         dr Casimiro Needle

## 2015-05-08 NOTE — Telephone Encounter (Signed)
Patient would like to know if she needs a referral to see pychiatrist Dr. Domenica Reamer. Patient isn't sure of the office name but she knows it's on [address] Patient phone: [phone]

## 2015-05-17 ENCOUNTER — Telehealth: Payer: Self-pay

## 2015-05-17 ENCOUNTER — Other Ambulatory Visit: Payer: Self-pay | Admitting: Emergency Medicine

## 2015-05-17 MED ORDER — ALPRAZOLAM 0.5 MG PO TABS: 0.5000 mg | ORAL_TABLET | Freq: Every evening | ORAL | Status: DC | PRN

## 2015-05-17 NOTE — Telephone Encounter (Signed)
Xanax   [phone]

## 2015-05-18 NOTE — Telephone Encounter (Signed)
Faxed. Notified pt  

## 2015-06-05 ENCOUNTER — Ambulatory Visit: Payer: Commercial Managed Care - HMO | Admitting: Emergency Medicine

## 2015-07-04 ENCOUNTER — Other Ambulatory Visit: Payer: Self-pay | Admitting: Family Medicine

## 2015-07-23 ENCOUNTER — Other Ambulatory Visit: Payer: Self-pay | Admitting: Cardiology

## 2015-07-23 DIAGNOSIS — I6523 Occlusion and stenosis of bilateral carotid arteries: Secondary | ICD-10-CM

## 2015-07-24 ENCOUNTER — Ambulatory Visit (HOSPITAL_COMMUNITY)
Admission: RE | Admit: 2015-07-24 | Discharge: 2015-07-24 | Disposition: A | Discharge status: Home / Self Care | Payer: Commercial Managed Care - HMO | Source: Ambulatory Visit | Attending: Cardiology | Admitting: Cardiology

## 2015-07-24 ENCOUNTER — Telehealth: Payer: Self-pay

## 2015-07-24 DIAGNOSIS — E785 Hyperlipidemia, unspecified: Secondary | ICD-10-CM | POA: Insufficient documentation

## 2015-07-24 DIAGNOSIS — I6523 Occlusion and stenosis of bilateral carotid arteries: Secondary | ICD-10-CM

## 2015-07-24 DIAGNOSIS — I10 Essential (primary) hypertension: Secondary | ICD-10-CM | POA: Insufficient documentation

## 2015-07-24 DIAGNOSIS — N951 Menopausal and female climacteric states: Secondary | ICD-10-CM

## 2015-07-24 NOTE — Telephone Encounter (Signed)
Pt states she is out of all her medicines and refused to name them all, states we need to look in her chart which is about 10 different meds, pt was told she hadn't have a CPE since 2016 but She wanted someone to call her back to see if she really needed to come in. Please call pt at [facility]

## 2015-07-24 NOTE — Telephone Encounter (Signed)
Ok to refill Dr. Everlene Farrier?

## 2015-07-25 MED ORDER — ESTRADIOL 1 MG PO TABS: 1.0000 mg | ORAL_TABLET | ORAL | Status: DC

## 2015-07-25 MED ORDER — OMEPRAZOLE 40 MG PO CPDR: DELAYED_RELEASE_CAPSULE | ORAL | Status: DC

## 2015-07-25 NOTE — Telephone Encounter (Signed)
Ok spoke with Hannah [name] and refilled the medications she needed.

## 2015-07-25 NOTE — Telephone Encounter (Signed)
Hannah [name]  had a checkup in June. It is okay to refill her medication.

## 2015-08-21 ENCOUNTER — Telehealth: Payer: Self-pay

## 2015-08-21 ENCOUNTER — Other Ambulatory Visit: Payer: Self-pay | Admitting: Emergency Medicine

## 2015-08-21 DIAGNOSIS — I38 Endocarditis, valve unspecified: Secondary | ICD-10-CM

## 2015-08-21 NOTE — Telephone Encounter (Addendum)
Pt states she is Dr Perfecto Kingdom pt and would like him to refer her to see Dr. Charolette Forward [facility]. Please call [phone]

## 2015-08-21 NOTE — Telephone Encounter (Signed)
I put in the referral to Dr. Terrence Dupont . She was previously in the care of Dr. Percival Spanish.. If she is having immediate issues we would be happy to see her here. I did go ahead and place the referral to Dr. Terrence Dupont.Marland Kitchen

## 2015-08-23 NOTE — Telephone Encounter (Signed)
Unable to reach Pt. Left vm to call back

## 2015-09-05 ENCOUNTER — Other Ambulatory Visit: Payer: Self-pay | Admitting: Family Medicine

## 2015-09-07 ENCOUNTER — Encounter: Payer: Self-pay | Admitting: Cardiology

## 2015-09-07 ENCOUNTER — Ambulatory Visit (INDEPENDENT_AMBULATORY_CARE_PROVIDER_SITE_OTHER): Payer: Commercial Managed Care - HMO | Admitting: Cardiology

## 2015-09-07 VITALS — BP 96/56 | HR 46 | Ht 62.0 in | Wt 121.3 lb

## 2015-09-07 DIAGNOSIS — I351 Nonrheumatic aortic (valve) insufficiency: Secondary | ICD-10-CM

## 2015-09-07 DIAGNOSIS — Z5181 Encounter for therapeutic drug level monitoring: Secondary | ICD-10-CM | POA: Diagnosis not present

## 2015-09-07 DIAGNOSIS — E785 Hyperlipidemia, unspecified: Secondary | ICD-10-CM | POA: Diagnosis not present

## 2015-09-07 DIAGNOSIS — I779 Disorder of arteries and arterioles, unspecified: Secondary | ICD-10-CM

## 2015-09-07 DIAGNOSIS — I739 Peripheral vascular disease, unspecified: Secondary | ICD-10-CM

## 2015-09-07 NOTE — Progress Notes (Signed)
HPI The patient presents for follow up of PVD and a murmur.  She had mild sclerosis on her aortic valve with mild aortic  Insufficiency. She did have carotid artery disease as described below.  Since I last saw her she has done well.   The patient denies any new symptoms such as chest discomfort, neck or arm discomfort. There has been no new shortness of breath, PND or orthopnea. There have been no reported palpitations, presyncope or syncope.   She exercises routinely.  She might get a little dizzy at times but this is mild.  She might feel a little "draggy" at times.    Allergies  Allergen Reactions  . Amlodipine   . Chantix [Varenicline Tartrate] Other (See Comments)    Tongue swell,sob  . Clarithromycin Rash  . Lisinopril Hives  . Simvastatin Hives  . Wellbutrin [Bupropion Hcl] Hives  . Lipitor [Atorvastatin]     Dizziness per patient    Current Outpatient Prescriptions  Medication Sig Dispense Refill  . ALPRAZolam (XANAX) 0.5 MG tablet Take 1 tablet (0.5 mg total) by mouth at bedtime as needed. for sleep 30 tablet 5  . aspirin 81 MG tablet Take 81 mg by mouth daily.    . bisoprolol-hydrochlorothiazide (ZIAC) 5-6.25 MG tablet TAKE 1 TABLET EVERY DAY 90 tablet 0  . cholecalciferol (VITAMIN D) 1000 UNITS tablet Take 1 tablet (1,000 Units total) by mouth daily. 30 tablet 5  . estradiol (ESTRACE) 1 MG tablet Take 1 tablet (1 mg total) by mouth every other day. 90 tablet 1  . fenofibrate 160 MG tablet TAKE 1 TABLET EVERY DAY 90 tablet 0  . fexofenadine (ALLEGRA) 180 MG tablet Take 1 tablet (180 mg total) by mouth daily. 30 tablet 5  . omeprazole (PRILOSEC) 40 MG capsule TAKE 2 CAPSULES EVERY DAY 180 capsule 1  . pravastatin (PRAVACHOL) 20 MG tablet TAKE 1 TABLET EVERY EVENING 90 tablet 0   No current facility-administered medications for this visit.    Past Medical History  Diagnosis Date  . Anxiety   . Depression   . Arthritis   . Hypertension   . Allergy   . Heart murmur      mild-moderate AR  . PVD (peripheral vascular disease) (HCC)     moderate carotid disease  . Dyslipidemia   . RBBB   . Smoker   . Carpal tunnel syndrome     Past Surgical History  Procedure Laterality Date  . Spine surgery  08/16/2009  . Abdominal hysterectomy  1994  . Tubal ligation    . Colonoscopy w/ polypectomy  2009  . Hemorrhoid surgery    . Total knee arthroplasty Left 02/13/2014    Procedure: TOTAL KNEE ARTHROPLASTY;  Surgeon: Alta Corning, MD;  Location: [facility];  Service: Orthopedics;  Laterality: Left;     ROS:  Positive for palpitations, reflux, difficulty swallowing, any pain. Otherwise as stated in the HPI and negative for all other systems.  PHYSICAL EXAM BP 96/56 mmHg  Pulse 46  Ht '5\' 2"'$  (1.575 m)  Wt 121 lb 5 oz (55.027 kg)  BMI 22.18 kg/m2 GENERAL:  Well appearing NECK:  No jugular venous distention, waveform within normal limits, carotid upstroke brisk and symmetric, soft right systolic bruit versus transmitted murmur no thyromegaly and LUNGS:  Clear to auscultation bilaterally CHEST:  Unremarkable HEART:  PMI not displaced or sustained,S1 and S2 within normal limits, no S3, no S4, no clicks, no rubs, 2/6 apical systolic murmur radiating slightly out  the aortic outflow tract murmurs ABD:  Flat, positive bowel sounds normal in frequency in pitch, no bruits, no rebound, no guarding, no midline pulsatile mass, no hepatomegaly, no splenomegaly EXT:  2 plus pulses upper with decreased DP/PT bilaterally, no edema, no cyanosis no clubbing NEURO:  nonfocal  EKG:  Sinus rhythm, rate 47, right bundle branch block, left anterior fascicular block, no acute ST-T wave changes.       ASSESSMENT AND PLAN  MURMUR:  This is mild aortic sclerosis and AI.  I will check this with an echo next year  RBBB:   No further evaluation is indicated.   CAROTID STENOSIS:  She will have carotid Dopplers to followup.    DYSLIPIDEMIA:  She has had much improvement with follow up in  our lipid clinic.   I will repeat a lipid profile Lab Results  Component Value Date   CHOL 168 12/05/2014   TRIG 216* 12/05/2014   HDL 34* 12/05/2014   LDLCALC 91 12/05/2014   LDLDIRECT 87.0 11/09/2014    TOBACCO:  She will now think about quitting and we discussed specifics.   BRADYCARDIA:  She tolerates this.  If she starts to have symptoms I will reduce her beta blocker.  HYPERTENSION:  Her BP is low.  She will keep a diary.  If it remains low I will reduce the Ziac.

## 2015-09-07 NOTE — Patient Instructions (Signed)
Medication Instructions: Dr Percival Spanish recommends that you continue on your current medications as directed. Please refer to the Current Medication list given to you today.  Labwork: Your physician recommends that you return for lab work at your earliest [place].  Testing/Procedures: 1. Carotid Doppler NOW - Your physician has requested that you have a carotid duplex. This test is an ultrasound of the carotid arteries in your neck. It looks at blood flow through these arteries that supply the brain with blood. Allow one hour for this exam. There are no restrictions or special instructions.  2. Echocardiogram in 1 YEAR - Your physician has requested that you have an echocardiogram. Echocardiography is a painless test that uses sound waves to create images of your heart. It provides your doctor with information about the size and shape of your heart and how well your heart's chambers and valves are working. This procedure takes approximately one hour. There are no restrictions for this procedure.  Follow-up: Dr Percival Spanish recommends that you schedule a follow-up appointment in 1 year. You will receive a reminder letter in the mail two months in advance. If you don't receive a letter, please call our office to schedule the follow-up appointment.  If you need a refill on your cardiac medications before your next appointment, please call your pharmacy.

## 2015-09-12 ENCOUNTER — Inpatient Hospital Stay (HOSPITAL_COMMUNITY): Admission: RE | Admit: 2015-09-12 | Payer: Commercial Managed Care - HMO | Source: Ambulatory Visit

## 2015-09-13 DIAGNOSIS — E785 Hyperlipidemia, unspecified: Secondary | ICD-10-CM | POA: Diagnosis not present

## 2015-09-13 DIAGNOSIS — Z5181 Encounter for therapeutic drug level monitoring: Secondary | ICD-10-CM | POA: Diagnosis not present

## 2015-09-13 LAB — CBC
HCT: 36.4 % (ref 36.0–46.0)
Hemoglobin: 11.4 g/dL — ABNORMAL LOW (ref 12.0–15.0)
MCH: 26.2 pg (ref 26.0–34.0)
MCHC: 31.3 g/dL (ref 30.0–36.0)
MCV: 83.7 fL (ref 78.0–100.0)
MPV: 10.2 fL (ref 8.6–12.4)
Platelets: 244 10*3/uL (ref 150–400)
RBC: 4.35 MIL/uL (ref 3.87–5.11)
RDW: 13.4 % (ref 11.5–15.5)
WBC: 6.2 10*3/uL (ref 4.0–10.5)

## 2015-09-14 LAB — LIPID PANEL
Cholesterol: 140 mg/dL (ref 125–200)
HDL: 49 mg/dL (ref >=46)
LDL Cholesterol: 72 mg/dL (ref <130)
Total CHOL/HDL Ratio: 2.9 Ratio (ref <=5.0)
Triglycerides: 93 mg/dL (ref <150)
VLDL: 19 mg/dL (ref <30)

## 2015-09-16 ENCOUNTER — Ambulatory Visit (INDEPENDENT_AMBULATORY_CARE_PROVIDER_SITE_OTHER): Payer: Commercial Managed Care - HMO | Admitting: Emergency Medicine

## 2015-09-16 VITALS — BP 86/52 | HR 62 | Temp 98.1°F | Resp 14 | Ht 62.0 in | Wt 120.0 lb

## 2015-09-16 DIAGNOSIS — R6889 Other general symptoms and signs: Secondary | ICD-10-CM

## 2015-09-16 DIAGNOSIS — N951 Menopausal and female climacteric states: Secondary | ICD-10-CM

## 2015-09-16 DIAGNOSIS — J209 Acute bronchitis, unspecified: Secondary | ICD-10-CM | POA: Diagnosis not present

## 2015-09-16 DIAGNOSIS — F17200 Nicotine dependence, unspecified, uncomplicated: Secondary | ICD-10-CM

## 2015-09-16 DIAGNOSIS — Z720 Tobacco use: Secondary | ICD-10-CM | POA: Diagnosis not present

## 2015-09-16 DIAGNOSIS — I10 Essential (primary) hypertension: Secondary | ICD-10-CM | POA: Diagnosis not present

## 2015-09-16 LAB — BASIC METABOLIC PANEL WITH GFR
BUN: 11 mg/dL (ref 7–25)
CO2: 30 mmol/L (ref 20–31)
Calcium: 10.5 mg/dL — ABNORMAL HIGH (ref 8.6–10.4)
Chloride: 104 mmol/L (ref 98–110)
Creat: 0.91 mg/dL (ref 0.50–0.99)
GFR, Est African American: 76 mL/min (ref >=60)
GFR, Est Non African American: 65 mL/min (ref >=60)
Glucose, Bld: 85 mg/dL (ref 65–99)
Potassium: 4.5 mmol/L (ref 3.5–5.3)
Sodium: 142 mmol/L (ref 135–146)

## 2015-09-16 LAB — POCT CBC
Granulocyte percent: 50.2 %G (ref 37–80)
HCT, POC: 35.1 % — AB (ref 37.7–47.9)
Hemoglobin: 12 g/dL — AB (ref 12.2–16.2)
Lymph, poc: 3.1 (ref 0.6–3.4)
MCH, POC: 27.5 pg (ref 27–31.2)
MCHC: 34.1 g/dL (ref 31.8–35.4)
MCV: 80.8 fL (ref 80–97)
MID (cbc): 0.4 (ref 0–0.9)
MPV: 7.4 fL (ref 0–99.8)
POC Granulocyte: 3.5 (ref 2–6.9)
POC LYMPH PERCENT: 43.6 %L (ref 10–50)
POC MID %: 6.2 %M (ref 0–12)
Platelet Count, POC: 241 10*3/uL (ref 142–424)
RBC: 4.34 M/uL (ref 4.04–5.48)
RDW, POC: 13.4 %
WBC: 7 10*3/uL (ref 4.6–10.2)

## 2015-09-16 LAB — TSH: TSH: 0.94 mIU/L

## 2015-09-16 MED ORDER — ALPRAZOLAM 0.5 MG PO TABS: 0.5000 mg | ORAL_TABLET | Freq: Every evening | ORAL | Status: DC | PRN

## 2015-09-16 MED ORDER — ESTRADIOL 1 MG PO TABS: 1.0000 mg | ORAL_TABLET | ORAL | Status: DC

## 2015-09-16 MED ORDER — BISOPROLOL-HYDROCHLOROTHIAZIDE 5-6.25 MG PO TABS: 1.0000 | ORAL_TABLET | Freq: Every day | ORAL | Status: DC

## 2015-09-16 MED ORDER — OMEPRAZOLE 40 MG PO CPDR: DELAYED_RELEASE_CAPSULE | ORAL | Status: DC

## 2015-09-16 MED ORDER — DOXYCYCLINE HYCLATE 100 MG PO TABS: 100.0000 mg | ORAL_TABLET | Freq: Two times a day (BID) | ORAL | Status: DC

## 2015-09-16 NOTE — Patient Instructions (Signed)
Decrease your blood pressure pill to a half tablet a day. Monitor your pressure at home. Please work on stopping smoking.

## 2015-09-16 NOTE — Progress Notes (Signed)
By signing my name below, I, Hannah [name], attest that this documentation has been prepared under the direction and in the presence of Hannah Jordan, MD. Electronically Signed: Judithe [name], ER Scribe. 09/16/2015. 11:23 AM.  Chief Complaint:  Chief Complaint  Patient presents with  . Sore Throat    x 2 weeks  . Nasal Congestion  . Fatigue  . Nail Problem    On right foot     HPI: Hannah [name] is a 65 y.o. female who reports to [facility] today complaining of wheezing, scratchy throat, nasal congestion, fatigue, cough productive of green mucous for the last two weeks. She denies SOB. She smokes 1 pac every three days. Her appetite has been reduced, which she thinks may be due to her medication. She states she is feeling cold all the time and her blood pressure has been low. She is wondering if the two are related.    Past Medical History  Diagnosis Date  . Anxiety   . Depression   . Arthritis   . Hypertension   . Allergy   . Heart murmur     mild-moderate AR  . PVD (peripheral vascular disease) (HCC)     moderate carotid disease  . Dyslipidemia   . RBBB   . Smoker   . Carpal tunnel syndrome    Past Surgical History  Procedure Laterality Date  . Spine surgery  08/16/2009  . Abdominal hysterectomy  1994  . Tubal ligation    . Colonoscopy w/ polypectomy  2009  . Hemorrhoid surgery    . Total knee arthroplasty Left 02/13/2014    Procedure: TOTAL KNEE ARTHROPLASTY;  Surgeon: Alta Corning, MD;  Location: [facility];  Service: Orthopedics;  Laterality: Left;   Social History   Social History  . Marital Status: Divorced    Spouse Name: N/A  . Number of Children: N/A  . Years of Education: N/A   Social History Main Topics  . Smoking status: Light Tobacco Smoker -- 0.50 packs/day for 40 years    Types: Cigarettes  . Smokeless tobacco: Never Used     Comment: PACK WILL LAST 4 DAYS  . Alcohol Use: No  . Drug Use: No  . Sexual Activity: No   Other Topics Concern  .  None   Social History Narrative   Family History  Problem Relation Age of Onset  . Cancer Father   . Diabetes Sister   . Stroke Maternal Grandfather    Allergies  Allergen Reactions  . Amlodipine   . Chantix [Varenicline Tartrate] Other (See Comments)    Tongue swell,sob  . Clarithromycin Rash  . Lisinopril Hives  . Simvastatin Hives  . Wellbutrin [Bupropion Hcl] Hives  . Lipitor [Atorvastatin]     Dizziness per patient   Prior to Admission medications   Medication Sig Start Date End Date Taking? Authorizing Provider  ALPRAZolam Duanne Moron) 0.5 MG tablet Take 1 tablet (0.5 mg total) by mouth at bedtime as needed. for sleep 05/17/15  Yes Darlyne Russian, MD  aspirin 81 MG tablet Take 81 mg by mouth daily.   Yes Historical Provider, MD  bisoprolol-hydrochlorothiazide [facility]) 5-6.25 MG tablet TAKE 1 TABLET EVERY DAY 09/07/15  Yes Robyn Haber, MD  cholecalciferol (VITAMIN D) 1000 UNITS tablet Take 1 tablet (1,000 Units total) by mouth daily. 05/30/14  Yes Stephanie D English, PA  estradiol (ESTRACE) 1 MG tablet Take 1 tablet (1 mg total) by mouth every other day. 07/25/15  Yes Remo Lipps  A Madisan Bice, MD  fenofibrate 160 MG tablet TAKE 1 TABLET EVERY DAY 09/07/15  Yes Robyn Haber, MD  omeprazole (PRILOSEC) 40 MG capsule TAKE 2 CAPSULES EVERY DAY 07/25/15  Yes Darlyne Russian, MD  pravastatin (PRAVACHOL) 20 MG tablet TAKE 1 TABLET EVERY EVENING 09/07/15  Yes Robyn Haber, MD  fexofenadine (ALLEGRA) 180 MG tablet Take 1 tablet (180 mg total) by mouth daily. Patient not taking: Reported on 09/16/2015 05/30/14   Dorian Heckle English, PA     ROS: The patient denies fevers, chills, night sweats, unintentional weight loss, chest pain, palpitations, dyspnea on exertion, nausea, vomiting, abdominal pain, dysuria, hematuria, melena, numbness, weakness, or tingling.   All other systems have been reviewed and were otherwise negative with the exception of those mentioned in the HPI and as above.    PHYSICAL  EXAM: Filed Vitals:   09/16/15 0935  BP: 86/52  Pulse: 62  Temp: 98.1 F (36.7 C)  Resp: 14   Body mass index is 21.94 kg/(m^2).   General: Alert, no acute distress HEENT:  Normocephalic, atraumatic, oropharynx patent. Eye: Juliette Mangle [facility] Cardiovascular:  Regular rate and rhythm, no rubs murmurs or gallops.  No Carotid bruits, radial pulse intact. No pedal edema.  Respiratory: Clear to auscultation bilaterally.  No wheezes, rales, or rhonchi.  No cyanosis, no use of accessory musculature Abdominal: No organomegaly, abdomen is soft and non-tender, positive bowel sounds.  No masses. Musculoskeletal: Gait intact. No edema, tenderness Skin: No rashes. Neurologic: Facial musculature symmetric. Psychiatric: Patient acts appropriately throughout our interaction. Lymphatic: No cervical or submandibular lymphadenopathy   LABS: Meds ordered this encounter  Medications  . doxycycline (VIBRA-TABS) 100 MG tablet    Sig: Take 1 tablet (100 mg total) by mouth 2 (two) times daily.    Dispense:  20 tablet    Refill:  0    EKG/XRAY:   Primary read interpreted by Dr. Everlene Farrier at [facility].   ASSESSMENT/PLAN: We'll decrease her blood pressure medication to half tablet a day. She will monitor her pressure at home. I gave her doxycycline to take for her on going bronchitis. She was again advised to stop smoking.I personally performed the services described in this documentation, which was scribed in my presence. The recorded information has been reviewed and is accurate.   Gross sideeffects, risk and benefits, and alternatives of medications d/w patient. Patient is aware that all medications have potential sideeffects and we are unable to predict every sideeffect or drug-drug interaction that may occur.  [name] MD 09/16/2015 11:23 AM

## 2015-09-19 ENCOUNTER — Telehealth: Payer: Self-pay | Admitting: Cardiology

## 2015-09-19 NOTE — Telephone Encounter (Signed)
Pt called in to get the results to her blood work. Please f/u with her  Thanks

## 2015-09-19 NOTE — Telephone Encounter (Signed)
Pt aware of blood work

## 2015-10-04 ENCOUNTER — Other Ambulatory Visit: Payer: Self-pay | Admitting: Emergency Medicine

## 2015-10-11 ENCOUNTER — Ambulatory Visit: Payer: Commercial Managed Care - HMO | Admitting: Emergency Medicine

## 2015-11-27 ENCOUNTER — Telehealth: Payer: Self-pay

## 2015-11-27 NOTE — Telephone Encounter (Signed)
I would suggest one of the primary care doctors at the office on the [place]. She can talk to Clayborn Bigness who is a Environmental education officer there. Between the 2 of them they can pick would be appropriate for her.

## 2015-11-27 NOTE — Telephone Encounter (Signed)
Patient states that Dr. Everlene Farrier mentioned to patient that he would recommend a new primary doctor at [facility]. Patient would like to know who he would suggest. Please advise! [phone]

## 2015-11-27 NOTE — Telephone Encounter (Signed)
[facility] at [facility].

## 2015-11-29 NOTE — Telephone Encounter (Signed)
Advised pt to contact [facility] and speak with Almyra Free.

## 2015-12-10 DIAGNOSIS — H25013 Cortical age-related cataract, bilateral: Secondary | ICD-10-CM | POA: Diagnosis not present

## 2015-12-10 DIAGNOSIS — H2513 Age-related nuclear cataract, bilateral: Secondary | ICD-10-CM | POA: Diagnosis not present

## 2016-01-05 ENCOUNTER — Telehealth: Payer: Self-pay

## 2016-01-05 ENCOUNTER — Ambulatory Visit (INDEPENDENT_AMBULATORY_CARE_PROVIDER_SITE_OTHER): Payer: Commercial Managed Care - HMO

## 2016-01-05 ENCOUNTER — Ambulatory Visit (INDEPENDENT_AMBULATORY_CARE_PROVIDER_SITE_OTHER): Payer: Commercial Managed Care - HMO | Admitting: Osteopathic Medicine

## 2016-01-05 VITALS — BP 130/60 | HR 63 | Temp 97.7°F | Resp 16 | Ht 62.0 in | Wt 123.0 lb

## 2016-01-05 DIAGNOSIS — R05 Cough: Secondary | ICD-10-CM

## 2016-01-05 DIAGNOSIS — R059 Cough, unspecified: Secondary | ICD-10-CM

## 2016-01-05 DIAGNOSIS — M62838 Other muscle spasm: Secondary | ICD-10-CM

## 2016-01-05 DIAGNOSIS — M6248 Contracture of muscle, other site: Secondary | ICD-10-CM

## 2016-01-05 MED ORDER — KETOROLAC TROMETHAMINE 60 MG/2ML IM SOLN
60.0000 mg | Freq: Once | INTRAMUSCULAR | Status: AC
  Administered 2016-01-05: 60 mg via INTRAMUSCULAR

## 2016-01-05 MED ORDER — CYCLOBENZAPRINE HCL 5 MG PO TABS: 5.0000 mg | ORAL_TABLET | Freq: Three times a day (TID) | ORAL | Status: DC | PRN

## 2016-01-05 NOTE — Progress Notes (Signed)
HPI: Hannah [name] is a 65 y.o. female who presents to [facility] 01/05/2016 for chief complaint of:  Chief Complaint  Patient presents with  . Shoulder Pain    x1 week or more  . Cough    Shoulder/upper back pain . Context: No injury or significant muscle strain, patient is retired,  . Location: Upper back across the shoulders - no pain in the joint of the shoulders . Quality: Soreness, occasional sharp . Severity: not getting any better . Duration: about a week and a half  . Assoc signs/symptoms: coughing for about 2 weeks seems to make pain worse.   Coughing: . Duration/Timing: Has been coughing increasingly frequently for about 2 weeks or so, occasionally productive. Worse at night . Context: Patient does have positive history of tobacco use, she is currently trying to quit.  . Assoc signs/symptoms:No fever or chills, patient cannot recall upper respiratory infection/cold symptoms prior to starting this cough. She does suffer from seasonal allergies and takes Human resources officer generic   Past medical, social and family history reviewed: Past Medical History  Diagnosis Date  . Anxiety   . Depression   . Arthritis   . Hypertension   . Allergy   . Heart murmur     mild-moderate AR  . PVD (peripheral vascular disease) (HCC)     moderate carotid disease  . Dyslipidemia   . RBBB   . Smoker   . Carpal tunnel syndrome    Past Surgical History  Procedure Laterality Date  . Spine surgery  08/16/2009  . Abdominal hysterectomy  1994  . Tubal ligation    . Colonoscopy w/ polypectomy  2009  . Hemorrhoid surgery    . Total knee arthroplasty Left 02/13/2014    Procedure: TOTAL KNEE ARTHROPLASTY;  Surgeon: Alta Corning, MD;  Location: [facility];  Service: Orthopedics;  Laterality: Left;   Social History  Substance Use Topics  . Smoking status: Light Tobacco Smoker -- 0.50 packs/day for 40 years    Types: Cigarettes  . Smokeless tobacco: Never Used     Comment:  PACK WILL LAST 4 DAYS  . Alcohol Use: No   Family History  Problem Relation Age of Onset  . Cancer Father   . Diabetes Sister   . Stroke Maternal Grandfather     Current Outpatient Prescriptions  Medication Sig Dispense Refill  . ALPRAZolam (XANAX) 0.5 MG tablet Take 1 tablet (0.5 mg total) by mouth at bedtime as needed. for sleep 30 tablet 5  . aspirin 81 MG tablet Take 81 mg by mouth daily.    . bisoprolol-hydrochlorothiazide (ZIAC) 5-6.25 MG tablet Take 1 tablet by mouth daily. 90 tablet 3  . cholecalciferol (VITAMIN D) 1000 UNITS tablet Take 1 tablet (1,000 Units total) by mouth daily. 30 tablet 5  . doxycycline (VIBRA-TABS) 100 MG tablet Take 1 tablet (100 mg total) by mouth 2 (two) times daily. 20 tablet 0  . estradiol (ESTRACE) 1 MG tablet Take 1 tablet (1 mg total) by mouth every other day. 90 tablet 1  . fenofibrate 160 MG tablet TAKE 1 TABLET EVERY DAY 30 tablet 0  . fexofenadine (ALLEGRA) 180 MG tablet Take 1 tablet (180 mg total) by mouth daily. 30 tablet 5  . omeprazole (PRILOSEC) 40 MG capsule TAKE 2 CAPSULES EVERY DAY 180 capsule 1  . pravastatin (PRAVACHOL) 20 MG tablet TAKE 1 TABLET EVERY EVENING 30 tablet 0   No current facility-administered medications for this visit.  Allergies  Allergen Reactions  . Amlodipine   . Chantix [Varenicline Tartrate] Other (See Comments)    Tongue swell,sob  . Clarithromycin Rash  . Lisinopril Hives  . Simvastatin Hives  . Wellbutrin [Bupropion Hcl] Hives  . Lipitor [Atorvastatin]     Dizziness per patient      Review of Systems: CONSTITUTIONAL:  No  fever, no chills, No  unintentional weight changes HEAD/EYES/EARS/NOSE/THROAT: No  headache, no vision change, no hearing change, No  sore throat, (+) seasonal allergies and sinus pressure CARDIAC: No  chest pain, No  pressure, No palpitations, RESPIRATORY: (+) cough, No  shortness of breath/wheeze MUSCULOSKELETAL: (+) myalgia/arthralgia as per HPI SKIN: No   rash/wounds/concerning lesions NEUROLOGIC: No  weakness, No  dizziness  Exam:  BP 130/60 mmHg  Pulse 63  Temp(Src) 97.7 F (36.5 C) (Oral)  Resp 16  Ht '5\' 2"'$  (1.575 m)  Wt 123 lb (55.792 kg)  BMI 22.49 kg/m2  SpO2 100% Constitutional: VS see above. General Appearance: alert, well-developed, well-nourished, NAD Eyes: Normal lids and conjunctive, non-icteric sclera, PERRLA Ears, Nose, Mouth, Throat: MMM, Normal external inspection ears/nares/mouth/lips/gums,Pharynx no erythema, no exudate.  Neck: No masses, trachea midline. No thyroid enlargement/tenderness/mass appreciated. No lymphadenopathy Respiratory: Normal respiratory effort. no wheeze, no rhonchi, no rales Cardiovascular: S1/S2 normal, 2/6 systolic murmur (patient reports known/old murmur), no rub/gallop auscultated. RRR. No lower extremity edema. Musculoskeletal: Gait normal. No clubbing/cyanosis of digits. (+) thoracic kyphosis pronounced, (+) tenderness to palpation of trapezius in region of C7-T3 Neurological: No cranial nerve deficit on limited exam. Motor and sensation intact and symmetric  Skin: warm, dry, intact. No rash/ulcer Psychiatric: Normal judgment/insight. Normal mood and affect. Oriented x3.     Imaging personally reviewed, no significant abnormality noted, visible arthritis, see below for radiologist over-read.   Dg Chest 2 View  01/05/2016  CLINICAL DATA:  Persistent cough EXAM: CHEST  2 VIEW COMPARISON:  February 08, 2014 FINDINGS: Lungs are clear. The heart size and pulmonary vascularity are normal. No pneumothorax. No adenopathy. There is degenerative change in the thoracic spine. IMPRESSION: No edema or consolidation. Electronically Signed   By: Lowella Grip III M.D.   On: 01/05/2016 15:45      ASSESSMENT/PLAN:   Trapezius muscle spasm - Plan: cyclobenzaprine (FLEXERIL) 5 MG tablet, ketorolac (TORADOL) injection 60 mg Home exercises given for trapezius muscle rehabilitation. Advised continuation of  ibuprofen, can add Tylenol. Can also try Aleve as alternative to ibuprofen. Patient educated on importance of home rehabilitation exercises, just mentoposterior to minimize strain on the trapezius, consider physical therapy referral if no improvement, could also consider pain management referral for possible trigger point injections or spinal injections.  Cough - Plan: DG Chest 2 View Increased cough over the past 2 weeks, could certainly a component of seasonal allergies, patient denies GERD symptoms. She is also cutting back on cigarette smoking, she is educated that occasionally this will make cough worse as the lungs daily a reactivating clear out all tar/mucus. Advised trial Flonase in addition to Allegra for seasonal allergies, can consider treatment for acid reflux, consider return to clinic for pulmonary function tests possible evaluation for COPD. Chest x-ray is clear but given smoking history and also eventually consider lung CT for cancer screening.   Patient advised to follow up about this at her annual wellness visit - she'll be seeing her PCP next month, who can recheck these symptoms as well as her other musculoskeletal complaints, or we are happy to place any referrals in follow-up as  needed as well.    Visit summary printed and instructions reviewed with the patient. ER/RTC precautions were reviewed. All questions answered. Return if symptoms worsen or fail to improve w/ home exercise - call us and we can place referral to PT.

## 2016-01-05 NOTE — Telephone Encounter (Signed)
Patient was seen by Dr. Sheppard Coil.  She is requesting a muscle relaxer for her neck pain.   [facility] [place], [place].

## 2016-01-05 NOTE — Patient Instructions (Addendum)
IF you received an x-ray today, you will receive an invoice from [facility]. Please contact [facility] at [phone] with questions or concerns regarding your invoice.   IF you received labwork today, you will receive an invoice from [employer]. Please contact [facility] at [phone] with questions or concerns regarding your invoice.   Our billing staff will not be able to assist you with questions regarding bills from these companies.  You will be contacted with the lab results as soon as they are available. The fastest way to get your results is to activate your My Chart account. Instructions are located on the last page of this paperwork. If you have not heard from [place] regarding the results in 2 weeks, please contact this office.     Trapezius Pain With Rehab The trapezius is a large muscle of the upper back that helps to control the shoulder blade (scapula) and stabilize the spine. SYMPTOMS   Pain that is achy and in the shoulder and/or upper back.  Decreased shoulder function and/or strength. TREATMENT Treatment initially involves resting from any activities that aggravate the symptoms, and the use of ice and medications to help reduce pain and inflammation. The use of strengthening and stretching exercises may help reduce pain with activity. These exercises may be performed at home or with referral to a therapist. A therapist may recommend further treatment, such as:  The use of electric current to simulate the nerves (transcutaneous electronic nerve stimulation [TENS]).  Ultrasound. If symptoms are severe, your caregiver may recommend you wear a brace to decrease discomfort. If symptoms persist for more than 6 months despite nonsurgical treatment, surgery may be recommended (uncommon). MEDICATION   If pain medication is necessary, then nonsteroidal anti-inflammatory medications, such as aspirin and ibuprofen, or other minor pain  relievers, such as acetaminophen, are often recommended.  Do not take pain medication for 7 days before surgery.  Prescription pain relievers may be given if deemed necessary by your caregiver. Use only as directed and only as much as you need. HEAT AND COLD  Cold treatment (icing) relieves pain and reduces inflammation. Cold treatment should be applied for 10 to 15 minutes every 2 to 3 hours for inflammation and pain and immediately after any activity that aggravates your symptoms. Use ice packs or massage the area with a piece of ice (ice massage).  Heat treatment may be used prior to performing the stretching and strengthening activities prescribed by your caregiver, physical therapist, or athletic trainer. Use a heat pack or soak your injury in warm water. SEEK MEDICAL CARE IF:  Treatment seems to offer no benefit, or the condition worsens.  Any medications produce adverse side effects. EXERCISES RANGE OF MOTION (ROM) AND STRETCHING EXERCISES - Trapezius Palsy (Spinal Accessory Nerve Palsy) These exercises may help you when beginning to rehabilitate your injury. Your symptoms may resolve with or without further involvement from your physician, physical therapist or athletic trainer. While completing these exercises, remember:   Restoring tissue flexibility helps normal motion to return to the joints. This allows healthier, less painful movement and activity.  An effective stretch should be held for at least 30 seconds.  A stretch should never be painful. You should only feel a gentle lengthening or release in the stretched tissue. STRETCH - Flexion, Standing  Stand with good posture. With an underhand grip on your right / left and an overhand grip on the opposite hand, grasp a broomstick or cane so that your  hands are a little more than shoulder-width apart.  Keeping your right / left elbow straight and shoulder muscles relaxed, push the stick with your opposite hand to raise your  right / left arm in front of your body and then overhead. Raise your arm until you feel a stretch in your right / left shoulder, but before you have increased shoulder pain.  Try to avoid shrugging your right / left shoulder as your arm rises by keeping your shoulder blade tucked down and toward your mid-back spine. Hold __________ seconds.  Slowly return to the starting position. Repeat __________ times. Complete this exercise __________ times per day.  STRETCH - Abduction, Supine  Stand with good posture. With an underhand grip on your right / left and an overhand grip on the opposite hand, grasp a broomstick or cane so that your hands are a little more than shoulder-width apart.  Keeping your right / left elbow straight and shoulder muscles relaxed, push the stick with your opposite hand to raise your right / left arm out to the side of your body and then overhead. Raise your arm until you feel a stretch in your right / left shoulder, but before you have increased shoulder pain.  Try to avoid shrugging your right / left shoulder as your arm rises by keeping your shoulder blade tucked down and toward your mid-back spine. Hold __________ seconds.  Slowly return to the starting position. Repeat __________ times. Complete this exercise __________ times per day.  ROM - Flexion, Active-Assisted  Lie on your back. You may bend your knees for comfort.  Grasp a broomstick or cane so your hands are about shoulder-width apart. Your right / left hand should grip the end of the stick/cane so that your hand is positioned "thumbs-up," as if you were about to shake hands.  Using your healthy arm to lead, raise your right / left arm overhead until you feel a gentle stretch in your shoulder. Hold __________ seconds.  Use the stick/cane to assist in returning your right / left arm to its starting position. Repeat __________ times. Complete this exercise __________ times per day.  STRETCH - External  Rotation and Abduction  Stagger your stance through a doorframe. It does not matter which foot is forward.  Choose one of the following positions as instructed by your physician, physical therapist or athletic trainer: place your hands:  and forearms above your head and on the door frame.  and forearms at head-height and on the door frame.  at elbow-height and on the door frame.  Keeping your head and chest upright and your stomach muscles tight to prevent over-extending your low-back, slowly shift your weight onto your front foot until you feel a stretch across your chest and/or in the front of your shoulders.  Hold __________ seconds. Shift your weight to your back foot to release the stretch. Repeat __________ times. Complete this stretch __________ times per day.  STRENGTHENING EXERCISES - Trapezius Palsy (Spinal Accessory Nerve Palsy) These exercises may help you when beginning to rehabilitate your injury. They may resolve your symptoms with or without further involvement from your physician, physical therapist or athletic trainer. While completing these exercises, remember:   Muscles can gain both the endurance and the strength needed for everyday activities through controlled exercises.  Complete these exercises as instructed by your physician, physical therapist or athletic trainer. Progress with the resistance and repetition exercises only as your caregiver advises.  You may experience muscle soreness or fatigue, but  the pain or discomfort you are trying to eliminate should never worsen during these exercises. If this pain does worsen, stop and make certain you are following the directions exactly. If the pain is still present after adjustments, discontinue the exercise until you can discuss the trouble with your clinician. STRENGTH - Scapular Depression and Adduction  With good posture, sit on a firm chair. Support your arms in front of you with pillows, arm rests or a table top.  Have your elbows in line with the sides of your body.  Gently draw your shoulder blades down and toward your mid-back spine. Gradually increase the tension without tensing the muscles along the top of your shoulders and the back of your neck.  Hold for __________ seconds. Slowly release the tension and relax your muscles completely before completing the next repetition.  After you have practiced this exercise, remove the arm support and complete it while standing as well as sitting. Repeat __________ times. Complete this exercise __________ times per day.  STRENGTH - Shoulder Abductors, Isometric   With good posture, stand or sit about 4-6 inches from a wall with your right / left side facing the wall.  Bend your right / left elbow. Gently press your right / left elbow into the wall. Increase the pressure gradually until you are pressing as hard as you can without shrugging your shoulder or increasing any shoulder discomfort.  Hold __________ seconds.  Release the tension slowly. Relax your shoulder muscles completely before you do the next repetition. Repeat __________ times. Complete this exercise __________ times per day.  STRENGTH - Shoulder Flexion, Isometric  With good posture and facing a wall, stand or sit about 4-6 inches away.  Keeping your right / left elbow straight, gently press the top of your fist into the wall. Increase the pressure gradually until you are pressing as hard as you can without shrugging your shoulder or increasing any shoulder discomfort.  Hold __________ seconds.  Release the tension slowly. Relax your shoulder muscles completely before you do the next repetition. Repeat __________ times. Complete this exercise __________ times per day.  STRENGTH - Internal Rotators  Secure a rubber exercise band/tubing to a fixed object so that it is at the same height as your right / left elbow when you are standing or sitting on a firm surface.  Stand or sit so that  the secured exercise band/tubing is at your right / left side.  Bend your elbow 90 degrees. Place a folded towel or small pillow under your right / left arm so that your elbow is a few inches away from your side.  Keeping the tension on the exercise band/tubing, pull it across your body toward your abdomen. Be sure to keep your body steady so that the movement is only coming from your shoulder rotating.  Hold __________ seconds. Release the tension in a controlled manner as you return to the starting position. Repeat __________ times. Complete this exercise __________ times per day.  STRENGTH - External Rotators  Secure a rubber exercise band/tubing to a fixed object so that it is at the same height as your right / left elbow when you are standing or sitting on a firm surface.  Stand or sit so that the secured exercise band/tubing is at your side that is not injured.  Bend your elbow 90 degrees. Place a folded towel or small pillow under your right / left arm so that your elbow is a few inches away from your side.  Keeping the tension on the exercise band/tubing, pull it away from your body, as if pivoting on your elbow. Be sure to keep your body steady so that the movement is only coming from your shoulder rotating.  Hold __________ seconds. Release the tension in a controlled manner as you return to the starting position. Repeat __________ times. Complete this exercise __________ times per day.  STRENGTH - Shoulder Extensors  Secure a rubber exercise band/tubing so that it is at the height of your shoulders when you are either standing or sitting on a firm arm-less chair.  With a thumbs-up grip, grasp an end of the band/tubing in each hand. Straighten your elbows and lift your hands straight in front of you at shoulder height. Step back away from the secured end of band/tubing until it becomes tense.  Squeezing your shoulder blades together, pull your hands down to the sides of your  thighs. Do not allow your hands to go behind you.  Hold for __________ seconds. Slowly ease the tension on the band/tubing as you reverse the directions and return to the starting position. Repeat __________ times. Complete this exercise __________ times per day.  STRENGTH - Shoulder Extensors, Prone  Lie on your stomach on a firm surface so that your right / left arm overhangs the edge. Rest your forehead on your opposite forearm. With your thumb facing away from your body and your elbow straight, hold a __________ weight in your hand.  Squeeze your right / left shoulder blade to your mid-back spine and then slowly raise your arm behind you to the height of the bed.  Hold for __________ seconds. Slowly reverse the directions and return to the starting position, controlling the weight as you lower your arm. Repeat __________ times. Complete this exercise __________ times per day.  STRENGTH - Horizontal Abductors Choose one of the two oppositions to complete this exercise. Prone (lying on stomach):  Lie on your stomach on a firm surface so that your right / left arm overhangs the edge. Rest your forehead on your opposite forearm. With your palm facing the floor and your elbow straight, hold a __________ weight in your hand.  Squeeze your right / left shoulder blade to your mid-back spine and then slowly raise your arm to the height of the bed.  Hold for __________ seconds. Slowly reverse the directions and return to the starting position, controlling the weight as you lower your arm. Repeat __________ times. Complete this exercise __________ times per day. Standing:   Secure a rubber exercise band/tubing so that it is at the height of your shoulders when you are either standing or sitting on a firm arm-less chair.  Grasp an end of the band/tubing in each hand and have your palms face each other. Straighten your elbows and lift your hands straight in front of you at shoulder height. Step back  away from the secured end of band/tubing until it becomes tense.  Squeeze your shoulder blades together. Keeping your elbows locked and your hands at shoulder-height, bring your hands out to your side.  Hold __________ seconds. Slowly ease the tension on the band/tubing as you reverse the directions and return to the starting position. Repeat __________ times. Complete this exercise __________ times per day. STRENGTH - Scapular Retractors and Elevators  Secure a rubber exercise band/tubing so that it is at the height of your shoulders when you are either standing or sitting on a firm arm-less chair.  With a thumbs-up grip, grasp an end  of the band/tubing in each hand. Step back away from the secured end of band/tubing until it becomes tense.  Squeezing your shoulder blades together, straighten your elbows and lift your hands straight over your head.  Hold for __________ seconds. Slowly ease the tension on the band/tubing as you reverse the directions and return to the starting position. Repeat __________ times. Complete this exercise __________ times per day.    This information is not intended to replace advice given to you by your health care provider. Make sure you discuss any questions you have with your health care provider.   Document Released: 06/16/2005 Document Revised: 10/31/2014 Document Reviewed: 09/28/2008 Elsevier Interactive Patient Education [employer].

## 2016-01-21 ENCOUNTER — Encounter: Payer: Self-pay | Admitting: Emergency Medicine

## 2016-01-21 DIAGNOSIS — Z1231 Encounter for screening mammogram for malignant neoplasm of breast: Secondary | ICD-10-CM | POA: Diagnosis not present

## 2016-01-24 ENCOUNTER — Telehealth: Payer: Self-pay

## 2016-01-24 NOTE — Telephone Encounter (Signed)
Pt would like to get patches to help her quit smoking. Her insurance company-[employer] told her they would pay for it if a doctor will approved this for her. Please advise at [phone]

## 2016-01-25 MED ORDER — NICOTINE 14 MG/24HR TD PT24: 14.0000 mg | MEDICATED_PATCH | Freq: Every day | TRANSDERMAL | 3 refills | Status: DC

## 2016-01-25 NOTE — Telephone Encounter (Signed)
Please call in NicoDerm patches dose of 14  she can have #30 patches. 3 refills  to apply daily.

## 2016-01-25 NOTE — Telephone Encounter (Signed)
Rx sent.  Pt notified. 

## 2016-02-20 ENCOUNTER — Telehealth: Payer: Self-pay

## 2016-02-20 NOTE — Telephone Encounter (Signed)
Ok to order 

## 2016-02-20 NOTE — Telephone Encounter (Signed)
The patient is requesting a referral to gastroenterology for a colonoscopy.  CB#: [id number]

## 2016-02-21 ENCOUNTER — Other Ambulatory Visit: Payer: Self-pay | Admitting: Emergency Medicine

## 2016-02-21 DIAGNOSIS — Z1211 Encounter for screening for malignant neoplasm of colon: Secondary | ICD-10-CM

## 2016-02-21 NOTE — Telephone Encounter (Signed)
I referred her last year and she did not go. I put in another referral. Please encourage her to follow-up with the gastroenterologist and have the procedure done. Thank you.

## 2016-02-22 ENCOUNTER — Encounter: Payer: Self-pay | Admitting: Internal Medicine

## 2016-02-22 NOTE — Telephone Encounter (Signed)
Pt advised.

## 2016-02-27 ENCOUNTER — Ambulatory Visit (INDEPENDENT_AMBULATORY_CARE_PROVIDER_SITE_OTHER): Payer: Commercial Managed Care - HMO | Admitting: Internal Medicine

## 2016-02-27 ENCOUNTER — Ambulatory Visit: Payer: Commercial Managed Care - HMO | Admitting: Internal Medicine

## 2016-02-27 ENCOUNTER — Encounter: Payer: Self-pay | Admitting: Internal Medicine

## 2016-02-27 VITALS — BP 138/68 | HR 58 | Temp 98.0°F | Resp 16 | Ht 60.0 in | Wt 126.0 lb

## 2016-02-27 DIAGNOSIS — N951 Menopausal and female climacteric states: Secondary | ICD-10-CM

## 2016-02-27 DIAGNOSIS — F419 Anxiety disorder, unspecified: Secondary | ICD-10-CM | POA: Insufficient documentation

## 2016-02-27 DIAGNOSIS — K219 Gastro-esophageal reflux disease without esophagitis: Secondary | ICD-10-CM | POA: Insufficient documentation

## 2016-02-27 DIAGNOSIS — E782 Mixed hyperlipidemia: Secondary | ICD-10-CM

## 2016-02-27 DIAGNOSIS — I10 Essential (primary) hypertension: Secondary | ICD-10-CM | POA: Diagnosis not present

## 2016-02-27 DIAGNOSIS — K5909 Other constipation: Secondary | ICD-10-CM

## 2016-02-27 DIAGNOSIS — K5900 Constipation, unspecified: Secondary | ICD-10-CM | POA: Diagnosis not present

## 2016-02-27 DIAGNOSIS — I739 Peripheral vascular disease, unspecified: Secondary | ICD-10-CM

## 2016-02-27 DIAGNOSIS — Z23 Encounter for immunization: Secondary | ICD-10-CM | POA: Diagnosis not present

## 2016-02-27 DIAGNOSIS — Z79890 Hormone replacement therapy (postmenopausal): Secondary | ICD-10-CM | POA: Insufficient documentation

## 2016-02-27 DIAGNOSIS — Z720 Tobacco use: Secondary | ICD-10-CM

## 2016-02-27 DIAGNOSIS — F1910 Other psychoactive substance abuse, uncomplicated: Secondary | ICD-10-CM

## 2016-02-27 MED ORDER — FENOFIBRATE 160 MG PO TABS: 160.0000 mg | ORAL_TABLET | Freq: Every day | ORAL | 3 refills | Status: DC

## 2016-02-27 MED ORDER — PRAVASTATIN SODIUM 20 MG PO TABS: 20.0000 mg | ORAL_TABLET | Freq: Every evening | ORAL | 3 refills | Status: DC

## 2016-02-27 MED ORDER — OMEPRAZOLE 40 MG PO CPDR: 40.0000 mg | DELAYED_RELEASE_CAPSULE | Freq: Two times a day (BID) | ORAL | 1 refills | Status: DC

## 2016-02-27 MED ORDER — ESTRADIOL 1 MG PO TABS: 1.0000 mg | ORAL_TABLET | ORAL | 1 refills | Status: DC

## 2016-02-27 MED ORDER — BISOPROLOL-HYDROCHLOROTHIAZIDE 5-6.25 MG PO TABS: 0.5000 | ORAL_TABLET | Freq: Every day | ORAL | 3 refills | Status: DC

## 2016-02-27 MED ORDER — ALPRAZOLAM 0.5 MG PO TABS: 0.5000 mg | ORAL_TABLET | Freq: Every evening | ORAL | 5 refills | Status: DC | PRN

## 2016-02-27 NOTE — Progress Notes (Signed)
Subjective:    Patient ID: Hannah [name], female    DOB: 07-18-1950, 65 y.o.   MRN: [id number]  HPI She is here to establish with a new pcp.    Smoking:  She smokes 1/3 of a pack a day.  She has tried to quit, but has not been successful.    Hypertension: She is taking her medication daily. She is compliant with a low sodium diet.  She denies chest pain, edema, shortness of breath and regular headaches. She is exercising regularly.  She does not monitor her blood pressure at home.    Hyperlipidemia: She is taking her medication daily. She is compliant with a low fat/cholesterol diet. She is exercising regularly. She denies myalgias.   GERD:  She is taking her medication daily as prescribed.  She denies any GERD symptoms and feels her GERD is well controlled.   Estrogen replacement:  She has been on estradiol since her hysterectomy in 1994.  She recently started taking one pill every other day.  She is smoking.  She does not follow with gyn.    Medications and allergies reviewed with patient and updated if appropriate.  Patient Active Problem List   Diagnosis Date Noted  . Osteoarthritis of left knee 02/19/2014  . S/P total knee replacement 02/13/2014  . RBBB 02/08/2014  . HTN (hypertension) 02/08/2014  . PVD - bilateral 60-79% carotid strenosis 02/08/2014  . Mixed hyperlipidemia 11/29/2013  . Carpal tunnel syndrome, bilateral 11/23/2013  . DJD (degenerative joint disease) 11/23/2013  . Murmur- mild -mod AR, mild MR 11/10/2013  . Acne 08/26/2013  . Chronic constipation 12/16/2012  . Arthritis 10/08/2012  . Unspecified episodic mood disorder 10/08/2012  . Nicotine abuse 12/29/2011    Current Outpatient Prescriptions on File Prior to Visit  Medication Sig Dispense Refill  . ALPRAZolam (XANAX) 0.5 MG tablet Take 1 tablet (0.5 mg total) by mouth at bedtime as needed. for sleep 30 tablet 5  . aspirin 81 MG tablet Take 81 mg by mouth daily.    . bisoprolol-hydrochlorothiazide  (ZIAC) 5-6.25 MG tablet Take 1 tablet by mouth daily. 90 tablet 3  . cholecalciferol (VITAMIN D) 1000 UNITS tablet Take 1 tablet (1,000 Units total) by mouth daily. 30 tablet 5  . estradiol (ESTRACE) 1 MG tablet Take 1 tablet (1 mg total) by mouth every other day. 90 tablet 1  . fenofibrate 160 MG tablet TAKE 1 TABLET EVERY DAY 30 tablet 0  . omeprazole (PRILOSEC) 40 MG capsule TAKE 2 CAPSULES EVERY DAY 180 capsule 1  . pravastatin (PRAVACHOL) 20 MG tablet TAKE 1 TABLET EVERY EVENING 30 tablet 0   No current facility-administered medications on file prior to visit.     Past Medical History:  Diagnosis Date  . Allergy   . Anxiety   . Arthritis   . Carpal tunnel syndrome   . Depression   . Dyslipidemia   . Heart murmur    mild-moderate AR  . Hypertension   . PVD (peripheral vascular disease) (HCC)    moderate carotid disease  . RBBB   . Smoker     Past Surgical History:  Procedure Laterality Date  . ABDOMINAL HYSTERECTOMY  1994  . COLONOSCOPY W/ POLYPECTOMY  2009  . HEMORRHOID SURGERY    . SPINE SURGERY  08/16/2009  . TOTAL KNEE ARTHROPLASTY Left 02/13/2014   Procedure: TOTAL KNEE ARTHROPLASTY;  Surgeon: Alta Corning, MD;  Location: [facility];  Service: Orthopedics;  Laterality: Left;  . TUBAL  LIGATION      Social History   Social History  . Marital status: Divorced    Spouse name: N/A  . Number of children: N/A  . Years of education: N/A   Social History Main Topics  . Smoking status: Light Tobacco Smoker    Packs/day: 0.50    Years: 40.00    Types: Cigarettes  . Smokeless tobacco: Never Used     Comment: PACK WILL LAST 4 DAYS  . Alcohol use No  . Drug use: No  . Sexual activity: No   Other Topics Concern  . Not on file   Social History Narrative  . No narrative on file    Family History  Problem Relation Age of Onset  . Cancer Father   . Diabetes Sister   . Stroke Maternal Grandfather     Review of Systems  Constitutional: Positive for appetite  change (appetite varies). Negative for chills and fever.  HENT: Positive for congestion and sinus pressure (mild). Negative for ear pain, postnasal drip and sore throat.   Eyes: Negative for visual disturbance.  Respiratory: Positive for cough (dry at night) and wheezing (sometimes). Negative for shortness of breath.   Cardiovascular: Positive for palpitations (rare). Negative for chest pain and leg swelling.  Gastrointestinal: Positive for abdominal pain (rare), blood in stool (? hemorrhoids) and constipation.       Gerd controlled  Genitourinary: Negative for dysuria and hematuria.  Musculoskeletal: Positive for arthralgias (knee pain - OA, ) and neck pain.  Neurological: Negative for light-headedness and headaches.  Psychiatric/Behavioral: Negative for dysphoric mood. The patient is nervous/anxious.        Objective:   Vitals:   02/27/16 1331  BP: 138/68  Pulse: (!) 58  Resp: 16  Temp: 98 F (36.7 C)   Filed Weights   02/27/16 1331  Weight: 126 lb (57.2 kg)   Body mass index is 24.61 kg/m.   Physical Exam Constitutional: She appears well-developed and well-nourished. No distress.  HENT:  Head: Normocephalic and atraumatic.  Right Ear: External ear normal. Normal ear canal and TM Left Ear: External ear normal.  Normal ear canal and TM Mouth/Throat: Oropharynx is clear and moist.  Eyes: Conjunctivae normal.  Neck: Neck supple. No tracheal deviation present. No thyromegaly present.  Right sided carotid bruit  Cardiovascular: Normal rate, regular rhythm and normal heart sounds.   2/6 systolic murmur heard.  No edema. Pulmonary/Chest: Effort normal and breath sounds normal. No respiratory distress. She has no wheezes. She has no rales.  Abdominal: Soft. She exhibits no distension. There is no tenderness.  Lymphadenopathy: She has no cervical adenopathy.  Skin: Skin is warm and dry. She is not diaphoretic.  Psychiatric: She has a normal mood and affect. Her behavior is  normal.         Assessment & Plan:   See Problem List for Assessment and Plan of chronic medical problems.

## 2016-02-27 NOTE — Patient Instructions (Addendum)
  Test(s) ordered today. Your results will be released to [facility] (or called to you) after review, usually within 72hours after test completion. If any changes need to be made, you will be notified at that same time.  All other Health Maintenance issues reviewed.   All recommended immunizations and age-appropriate screenings are up-to-date or discussed.  A pneumonia vaccine administered today.   Medications reviewed and updated.  No changes recommended at this time.  Your prescription(s) have been submitted to your pharmacy. Please take as directed and contact our office if you believe you are having problem(s) with the medication(s).   Please followup in 6 months

## 2016-02-27 NOTE — Assessment & Plan Note (Addendum)
Stool softeners - not helpful Drinks water, exercising Taking probiotics Colonoscopy pre-visit scheduled - will have them discuss with them

## 2016-02-27 NOTE — Assessment & Plan Note (Signed)
Lipid panel controlled Continue current medication

## 2016-02-27 NOTE — Assessment & Plan Note (Signed)
Takes it at night and as needed - takes less than one pill when she takes it Discussed daily SSRI

## 2016-02-27 NOTE — Assessment & Plan Note (Signed)
Has been on HRT since 1994 after TAH Advised need to taper off slowly - she smokes and is at risk of a stroke/clot She will continue to taper off slowly

## 2016-02-27 NOTE — Assessment & Plan Note (Signed)
GERD controlled Continue daily medication  

## 2016-02-27 NOTE — Assessment & Plan Note (Signed)
BP well controlled Current regimen effective and well tolerated Continue current medications at current doses  

## 2016-02-27 NOTE — Assessment & Plan Note (Signed)
Stressed smoking cessation

## 2016-02-27 NOTE — Assessment & Plan Note (Signed)
No symptoms Monitored by cardiology

## 2016-02-27 NOTE — Progress Notes (Signed)
Pre visit review using our clinic review tool, if applicable. No additional management support is needed unless otherwise documented below in the visit note. 

## 2016-03-13 ENCOUNTER — Encounter: Payer: Self-pay | Admitting: Internal Medicine

## 2016-03-13 ENCOUNTER — Ambulatory Visit (INDEPENDENT_AMBULATORY_CARE_PROVIDER_SITE_OTHER): Payer: Commercial Managed Care - HMO | Admitting: Internal Medicine

## 2016-03-13 VITALS — BP 138/70 | HR 80 | Temp 99.4°F | Resp 20 | Wt 123.0 lb

## 2016-03-13 DIAGNOSIS — R059 Cough, unspecified: Secondary | ICD-10-CM | POA: Insufficient documentation

## 2016-03-13 DIAGNOSIS — R05 Cough: Secondary | ICD-10-CM

## 2016-03-13 DIAGNOSIS — I10 Essential (primary) hypertension: Secondary | ICD-10-CM

## 2016-03-13 MED ORDER — LEVOFLOXACIN 250 MG PO TABS: 250.0000 mg | ORAL_TABLET | Freq: Every day | ORAL | 0 refills | Status: DC

## 2016-03-13 MED ORDER — HYDROCODONE-HOMATROPINE 5-1.5 MG/5ML PO SYRP: 5.0000 mL | ORAL_SOLUTION | Freq: Four times a day (QID) | ORAL | 0 refills | Status: DC | PRN

## 2016-03-13 MED ORDER — AZITHROMYCIN 250 MG PO TABS: ORAL_TABLET | ORAL | 1 refills | Status: DC

## 2016-03-13 NOTE — Progress Notes (Signed)
Subjective:    Patient ID: Hannah [name], female    DOB: May 17, 1951, 65 y.o.   MRN: [id number]  HPI  Here with acute onset mild to mod 2-3 days ST, HA, general weakness and malaise, with prod cough greenish sputum, but Pt denies chest pain, increased sob or doe, wheezing, orthopnea, PND, increased LE swelling, palpitations, dizziness or syncope.  Pt denies new neurological symptoms such as new headache, or facial or extremity weakness or numbness   Pt denies polydipsia, polyuria, Past Medical History:  Diagnosis Date  . Allergy   . Anxiety   . Arthritis   . Carpal tunnel syndrome   . Depression   . Dyslipidemia   . Heart murmur    mild-moderate AR  . Hypertension   . PVD (peripheral vascular disease) (HCC)    moderate carotid disease  . RBBB   . Smoker    Past Surgical History:  Procedure Laterality Date  . ABDOMINAL HYSTERECTOMY  1994  . COLONOSCOPY W/ POLYPECTOMY  2009  . HEMORRHOID SURGERY    . SPINE SURGERY  08/16/2009  . TOTAL KNEE ARTHROPLASTY Left 02/13/2014   Procedure: TOTAL KNEE ARTHROPLASTY;  Surgeon: Alta Corning, MD;  Location: [facility];  Service: Orthopedics;  Laterality: Left;  . TUBAL LIGATION      reports that she has been smoking Cigarettes.  She has a 20.00 pack-year smoking history. She has never used smokeless tobacco. She reports that she does not drink alcohol or use drugs. family history includes Cancer in her father; Diabetes in her sister; Stroke in her maternal grandfather. Allergies  Allergen Reactions  . Amlodipine     Rash, swelling  . Chantix [Varenicline Tartrate] Other (See Comments)    Tongue swell,sob  . Clarithromycin Rash  . Lisinopril Hives  . Simvastatin Hives  . Wellbutrin [Bupropion Hcl] Hives  . Lipitor [Atorvastatin]     Dizziness per patient   Current Outpatient Prescriptions on File Prior to Visit  Medication Sig Dispense Refill  . ALPRAZolam (XANAX) 0.5 MG tablet Take 1 tablet (0.5 mg total) by mouth at bedtime as needed.  for sleep 30 tablet 5  . aspirin 81 MG tablet Take 81 mg by mouth daily.    . bisoprolol-hydrochlorothiazide (ZIAC) 5-6.25 MG tablet Take 0.5 tablets by mouth daily. 90 tablet 3  . cholecalciferol (VITAMIN D) 1000 UNITS tablet Take 1 tablet (1,000 Units total) by mouth daily. 30 tablet 5  . estradiol (ESTRACE) 1 MG tablet Take 1 tablet (1 mg total) by mouth every other day. 90 tablet 1  . fenofibrate 160 MG tablet Take 1 tablet (160 mg total) by mouth daily. 90 tablet 3  . omeprazole (PRILOSEC) 40 MG capsule Take 1 capsule (40 mg total) by mouth 2 (two) times daily before lunch and supper. TAKE 2 CAPSULES EVERY DAY 180 capsule 1  . pravastatin (PRAVACHOL) 20 MG tablet Take 1 tablet (20 mg total) by mouth every evening. 90 tablet 3   No current facility-administered medications on file prior to visit.    Review of Systems  All otherwise neg per pt     Objective:   Physical Exam BP 138/70   Pulse 80   Temp 99.4 F (37.4 C) (Oral)   Resp 20   Wt 123 lb (55.8 kg)   SpO2 98%   BMI 24.02 kg/m  VS noted, mild ill Constitutional: Pt appears in no apparent distress HENT: Head: NCAT.  Right Ear: External ear normal.  Left Ear: External ear  normal.  Eyes: . Pupils are equal, round, and reactive to light. Conjunctivae and EOM are normal Bilat tm's with mild erythema.  Max sinus areas non tender.  Pharynx with mild erythema, no exudate Neck: Normal range of motion. Neck supple.  Cardiovascular: Normal rate and regular rhythm.   Pulmonary/Chest: Effort normal and breath sounds without rales or wheezing.  Neurological: Pt is alert. Not confused , motor grossly intact Skin: Skin is warm. No rash, no LE edema Psychiatric: Pt behavior is normal. No agitation.   Influenza neg    Assessment & Plan:

## 2016-03-13 NOTE — Patient Instructions (Signed)
Your flu test was negative  Please take all new medication as prescribed - the antibiotic, and cough medicine  Please continue all other medications as before, and refills have been done if requested.  Please have the pharmacy call with any other refills you may need.  Please keep your appointments with your specialists as you may have planned

## 2016-03-13 NOTE — Progress Notes (Signed)
Pre visit review using our clinic review tool, if applicable. No additional management support is needed unless otherwise documented below in the visit note. 

## 2016-03-15 NOTE — Assessment & Plan Note (Signed)
stable overall by history and exam, recent data reviewed with pt, and pt to continue medical treatment as before,  to f/u any worsening symptoms or concerns BP Readings from Last 3 Encounters:  03/13/16 138/70  02/27/16 138/68  01/05/16 130/60

## 2016-03-15 NOTE — Assessment & Plan Note (Signed)
Mild to mod, c/w bronchitis vs pna, decliens cxr, for antibx, cough med prn,  to f/u any worsening symptoms or concerns

## 2016-03-19 ENCOUNTER — Telehealth: Payer: Self-pay | Admitting: Internal Medicine

## 2016-03-19 NOTE — Telephone Encounter (Signed)
Patient is requesting another round of antibiotic from last visit.  Patient states her ears are still not feeling clear.  She feels like water is in her ear.  She states she is also feeling weak.

## 2016-03-20 MED ORDER — LEVOFLOXACIN 250 MG PO TABS: 250.0000 mg | ORAL_TABLET | Freq: Every day | ORAL | 0 refills | Status: DC

## 2016-03-20 NOTE — Telephone Encounter (Signed)
Ok, done erx to [facility]

## 2016-03-28 ENCOUNTER — Other Ambulatory Visit: Payer: Self-pay | Admitting: Internal Medicine

## 2016-04-10 ENCOUNTER — Ambulatory Visit (AMBULATORY_SURGERY_CENTER): Payer: Self-pay | Admitting: *Deleted

## 2016-04-10 ENCOUNTER — Encounter (INDEPENDENT_AMBULATORY_CARE_PROVIDER_SITE_OTHER): Payer: Self-pay

## 2016-04-10 ENCOUNTER — Encounter: Payer: Self-pay | Admitting: Internal Medicine

## 2016-04-10 VITALS — Ht 60.0 in | Wt 122.0 lb

## 2016-04-10 DIAGNOSIS — Z1211 Encounter for screening for malignant neoplasm of colon: Secondary | ICD-10-CM

## 2016-04-10 MED ORDER — NA SULFATE-K SULFATE-MG SULF 17.5-3.13-1.6 GM/177ML PO SOLN: 1.0000 | Freq: Once | ORAL | 0 refills | Status: AC

## 2016-04-10 NOTE — Progress Notes (Signed)
No egg or soy allergy known to patient  No issues with past sedation with any surgeries  or procedures, no intubation problems  No diet pills per patient No home 02 use per patient  No blood thinners per patient   Pt states issues with constipation - she feels bloated all the time --uses probiotic but no help- she only stools if she takes a stool softener - will do a 2 day prep due to this issue   No A fib or A flutter

## 2016-04-24 ENCOUNTER — Encounter: Payer: Self-pay | Admitting: Internal Medicine

## 2016-04-24 ENCOUNTER — Ambulatory Visit (AMBULATORY_SURGERY_CENTER): Payer: Commercial Managed Care - HMO | Admitting: Internal Medicine

## 2016-04-24 VITALS — BP 132/64 | HR 40 | Temp 96.6°F | Resp 15 | Ht 60.0 in | Wt 122.0 lb

## 2016-04-24 DIAGNOSIS — D123 Benign neoplasm of transverse colon: Secondary | ICD-10-CM | POA: Diagnosis not present

## 2016-04-24 DIAGNOSIS — D125 Benign neoplasm of sigmoid colon: Secondary | ICD-10-CM | POA: Diagnosis not present

## 2016-04-24 DIAGNOSIS — Z1212 Encounter for screening for malignant neoplasm of rectum: Secondary | ICD-10-CM

## 2016-04-24 DIAGNOSIS — Z1211 Encounter for screening for malignant neoplasm of colon: Secondary | ICD-10-CM

## 2016-04-24 DIAGNOSIS — D124 Benign neoplasm of descending colon: Secondary | ICD-10-CM | POA: Diagnosis not present

## 2016-04-24 MED ORDER — SODIUM CHLORIDE 0.9 % IV SOLN: 500.0000 mL | INTRAVENOUS | Status: DC

## 2016-04-24 NOTE — Patient Instructions (Signed)
Information on polyps,diverticulosis,and hemorrhoids given to you today  Avoid Ibuprofen, Naproxen,or other non steroidal anti inflammatory medications for 3 weeks after polyp removal    YOU HAD AN ENDOSCOPIC PROCEDURE TODAY AT [facility]:   Refer to the procedure report that was given to you for any specific questions about what was found during the examination.  If the procedure report does not answer your questions, please call your gastroenterologist to clarify.  If you requested that your care partner not be given the details of your procedure findings, then the procedure report has been included in a sealed envelope for you to review at your convenience later.  YOU SHOULD EXPECT: Some feelings of bloating in the abdomen. Passage of more gas than usual.  Walking can help get rid of the air that was put into your GI tract during the procedure and reduce the bloating. If you had a lower endoscopy (such as a colonoscopy or flexible sigmoidoscopy) you may notice spotting of blood in your stool or on the toilet paper. If you underwent a bowel prep for your procedure, you may not have a normal bowel movement for a few days.  Please Note:  You might notice some irritation and congestion in your nose or some drainage.  This is from the oxygen used during your procedure.  There is no need for concern and it should clear up in a day or so.  SYMPTOMS TO REPORT IMMEDIATELY:   Following lower endoscopy (colonoscopy or flexible sigmoidoscopy):  Excessive amounts of blood in the stool  Significant tenderness or worsening of abdominal pains  Swelling of the abdomen that is new, acute  Fever of 100F or higher   Following upper endoscopy (EGD)  Vomiting of blood or coffee ground material  New chest pain or pain under the shoulder blades  Painful or persistently difficult swallowing  New shortness of breath  Fever of 100F or higher  Black, tarry-looking stools  For urgent or  emergent issues, a gastroenterologist can be reached at any hour by calling [phone].   DIET:  We do recommend a small meal at first, but then you may proceed to your regular diet.  Drink plenty of fluids but you should avoid alcoholic beverages for 24 hours.  ACTIVITY:  You should plan to take it easy for the rest of today and you should NOT DRIVE or use heavy machinery until tomorrow (because of the sedation medicines used during the test).    FOLLOW UP: Our staff will call the number listed on your records the next business day following your procedure to check on you and address any questions or concerns that you may have regarding the information given to you following your procedure. If we do not reach you, we will leave a message.  However, if you are feeling well and you are not experiencing any problems, there is no need to return our call.  We will assume that you have returned to your regular daily activities without incident.  If any biopsies were taken you will be contacted by phone or by letter within the next 1-3 weeks.  Please call us at [phone] if you have not heard about the biopsies in 3 weeks.    SIGNATURES/CONFIDENTIALITY: You and/or your care partner have signed paperwork which will be entered into your electronic medical record.  These signatures attest to the fact that that the information above on your After Visit Summary has been reviewed and is understood.  Full responsibility of the confidentiality of this discharge information lies with you and/or your care-partner.

## 2016-04-24 NOTE — Progress Notes (Signed)
A and O x3. Report to RN. Tolerated MAC anesthesia well. 

## 2016-04-24 NOTE — Op Note (Signed)
[facility] Patient Name: Hannah [name] Procedure Date: 04/24/2016 9:54 AM MRN: [id number] Endoscopist: Jerene Bears , MD Age: 65 Referring MD:  Date of Birth: 1951-04-29 Gender: Female Account #: [id number] Procedure:                Colonoscopy Indications:              Screening for colorectal malignant neoplasm, last                            colonoscopy approximately 12 years ago Medicines:                Monitored Anesthesia Care Procedure:                Pre-Anesthesia Assessment:                           - Prior to the procedure, a History and Physical                            was performed, and patient medications and                            allergies were reviewed. The patient's tolerance of                            previous anesthesia was also reviewed. The risks                            and benefits of the procedure and the sedation                            options and risks were discussed with the patient.                            All questions were answered, and informed consent                            was obtained. Prior Anticoagulants: The patient has                            taken no previous anticoagulant or antiplatelet                            agents. ASA Grade Assessment: II - A patient with                            mild systemic disease. After reviewing the risks                            and benefits, the patient was deemed in                            satisfactory condition to undergo the procedure.  After obtaining informed consent, the colonoscope                            was passed under direct vision. Throughout the                            procedure, the patient's blood pressure, pulse, and                            oxygen saturations were monitored continuously. The                            Model PCF-H190L [id number]) scope was introduced                            through the anus and  advanced to the the cecum,                            identified by appendiceal orifice and ileocecal                            valve. The colonoscopy was performed without                            difficulty. The patient tolerated the procedure                            well. The quality of the bowel preparation was                            good. The ileocecal valve, appendiceal orifice, and                            rectum were photographed. Scope In: 9:58:02 AM Scope Out: 10:27:47 AM Scope Withdrawal Time: 0 hours 21 minutes 40 seconds  Total Procedure Duration: 0 hours 29 minutes 45 seconds  Findings:                 The perianal exam findings include non-thrombosed                            external hemorrhoids and non-thrombosed internal                            hemorrhoids.                           Three sessile polyps were found in the transverse                            colon. The polyps were 5 to 7 mm in size. These                            polyps were removed with a cold snare. Resection  and retrieval were complete.                           A 3 mm polyp was found in the transverse colon. The                            polyp was sessile. The polyp was removed with a                            cold biopsy forceps. Resection and retrieval were                            complete.                           A 5 mm polyp was found in the descending colon. The                            polyp was sessile. The polyp was removed with a                            cold snare. Resection and retrieval were complete.                           A 8 mm polyp was found in the descending colon. The                            polyp was pedunculated. The polyp was removed with                            a hot snare. Resection and retrieval were complete.                           Two pedunculated polyps were found in the sigmoid                             colon. The polyps were 7 to 10 mm in size. These                            polyps were removed with a hot snare. Resection and                            retrieval were complete.                           External and internal hemorrhoids were found during                            retroflexion and during perianal exam. The                            hemorrhoids were small.  A few small-mouthed diverticula were found in the                            descending colon.                           An area of mild melanosis was found in the entire                            colon. Complications:            No immediate complications. Estimated Blood Loss:     Estimated blood loss was minimal. Impression:               - Three 5 to 7 mm polyps in the transverse colon,                            removed with a cold snare. Resected and retrieved.                           - One 3 mm polyp in the transverse colon, removed                            with a cold biopsy forceps. Resected and retrieved.                           - One 5 mm polyp in the descending colon, removed                            with a cold snare. Resected and retrieved.                           - One 8 mm polyp in the descending colon, removed                            with a hot snare. Resected and retrieved.                           - Two 7 to 10 mm polyps in the sigmoid colon,                            removed with a hot snare. Resected and retrieved.                           - Mild diverticulosis in the descending colon.                           - Melanosis in the colon.                           - External and internal hemorrhoids. Recommendation:           - Patient has a contact number available for  emergencies. The signs and symptoms of potential                            delayed complications were discussed with the                            patient.  Return to normal activities tomorrow.                            Written discharge instructions were provided to the                            patient.                           - Resume previous diet.                           - Continue present medications.                           - No ibuprofen, naproxen, or other non-steroidal                            anti-inflammatory drugs for 3 weeks after polyp                            removal.                           - Await pathology results.                           - Repeat colonoscopy is recommended for                            surveillance. The colonoscopy date will be                            determined after pathology results from today's                            exam become available for review. Jerene Bears, MD 04/24/2016 10:36:36 AM This report has been signed electronically.

## 2016-04-24 NOTE — Progress Notes (Signed)
Called to room to assist during endoscopic procedure.  Patient ID and intended procedure confirmed with present staff. Received instructions for my participation in the procedure from the performing physician.  

## 2016-04-25 ENCOUNTER — Telehealth: Payer: Self-pay

## 2016-04-25 ENCOUNTER — Telehealth: Payer: Self-pay | Admitting: *Deleted

## 2016-04-25 NOTE — Telephone Encounter (Signed)
  Follow up Call-  Call back number 04/24/2016  Post procedure Call Back phone  # [phone]  Permission to leave phone message Yes  Some recent data might be hidden    [facility]

## 2016-04-25 NOTE — Telephone Encounter (Signed)
  Follow up Call-  Call back number 04/24/2016  Post procedure Call Back phone  # [phone]  Permission to leave phone message Yes  Some recent data might be hidden    Patient was called for follow up after her procedure on 04/24/2016. No answer at the number given for follow up phone call. A message was left on the answering machine.

## 2016-05-01 ENCOUNTER — Encounter: Payer: Self-pay | Admitting: Internal Medicine

## 2016-06-02 ENCOUNTER — Other Ambulatory Visit: Payer: Self-pay | Admitting: *Deleted

## 2016-06-02 DIAGNOSIS — N951 Menopausal and female climacteric states: Secondary | ICD-10-CM

## 2016-06-02 MED ORDER — BISOPROLOL-HYDROCHLOROTHIAZIDE 5-6.25 MG PO TABS: 0.5000 | ORAL_TABLET | Freq: Every day | ORAL | 2 refills | Status: DC

## 2016-06-02 MED ORDER — OMEPRAZOLE 40 MG PO CPDR: 40.0000 mg | DELAYED_RELEASE_CAPSULE | Freq: Two times a day (BID) | ORAL | 2 refills | Status: DC

## 2016-06-02 MED ORDER — ESTRADIOL 1 MG PO TABS: 1.0000 mg | ORAL_TABLET | ORAL | 1 refills | Status: DC

## 2016-06-02 MED ORDER — PRAVASTATIN SODIUM 20 MG PO TABS: 20.0000 mg | ORAL_TABLET | Freq: Every evening | ORAL | 2 refills | Status: DC

## 2016-06-02 NOTE — Telephone Encounter (Signed)
Rec'd call pt is requesting refills to be sent to [facility]. Verified meds that she is needing sent electronically to [facility]!

## 2016-08-22 ENCOUNTER — Ambulatory Visit (HOSPITAL_COMMUNITY)
Admission: RE | Admit: 2016-08-22 | Discharge: 2016-08-22 | Disposition: A | Discharge status: Home / Self Care | Payer: Medicare HMO | Source: Ambulatory Visit | Attending: Cardiology | Admitting: Cardiology

## 2016-08-22 DIAGNOSIS — Z720 Tobacco use: Secondary | ICD-10-CM | POA: Diagnosis not present

## 2016-08-22 DIAGNOSIS — E785 Hyperlipidemia, unspecified: Secondary | ICD-10-CM | POA: Insufficient documentation

## 2016-08-22 DIAGNOSIS — I739 Peripheral vascular disease, unspecified: Secondary | ICD-10-CM | POA: Diagnosis not present

## 2016-08-22 DIAGNOSIS — I6523 Occlusion and stenosis of bilateral carotid arteries: Secondary | ICD-10-CM | POA: Diagnosis not present

## 2016-08-22 DIAGNOSIS — I10 Essential (primary) hypertension: Secondary | ICD-10-CM | POA: Insufficient documentation

## 2016-08-22 DIAGNOSIS — I779 Disorder of arteries and arterioles, unspecified: Secondary | ICD-10-CM

## 2016-08-28 ENCOUNTER — Ambulatory Visit (INDEPENDENT_AMBULATORY_CARE_PROVIDER_SITE_OTHER): Payer: Medicare HMO | Admitting: Internal Medicine

## 2016-08-28 ENCOUNTER — Encounter: Payer: Self-pay | Admitting: Internal Medicine

## 2016-08-28 ENCOUNTER — Other Ambulatory Visit (INDEPENDENT_AMBULATORY_CARE_PROVIDER_SITE_OTHER): Payer: Medicare HMO

## 2016-08-28 VITALS — BP 138/70 | HR 58 | Temp 98.2°F | Resp 16 | Ht 60.0 in | Wt 125.0 lb

## 2016-08-28 DIAGNOSIS — Z1159 Encounter for screening for other viral diseases: Secondary | ICD-10-CM

## 2016-08-28 DIAGNOSIS — Z0000 Encounter for general adult medical examination without abnormal findings: Secondary | ICD-10-CM

## 2016-08-28 DIAGNOSIS — N951 Menopausal and female climacteric states: Secondary | ICD-10-CM | POA: Diagnosis not present

## 2016-08-28 DIAGNOSIS — K5909 Other constipation: Secondary | ICD-10-CM | POA: Diagnosis not present

## 2016-08-28 DIAGNOSIS — Z720 Tobacco use: Secondary | ICD-10-CM | POA: Diagnosis not present

## 2016-08-28 DIAGNOSIS — E782 Mixed hyperlipidemia: Secondary | ICD-10-CM

## 2016-08-28 DIAGNOSIS — K219 Gastro-esophageal reflux disease without esophagitis: Secondary | ICD-10-CM

## 2016-08-28 DIAGNOSIS — Z13820 Encounter for screening for osteoporosis: Secondary | ICD-10-CM

## 2016-08-28 DIAGNOSIS — F419 Anxiety disorder, unspecified: Secondary | ICD-10-CM | POA: Diagnosis not present

## 2016-08-28 DIAGNOSIS — Z79890 Hormone replacement therapy (postmenopausal): Secondary | ICD-10-CM | POA: Diagnosis not present

## 2016-08-28 DIAGNOSIS — I10 Essential (primary) hypertension: Secondary | ICD-10-CM

## 2016-08-28 LAB — CBC WITH DIFFERENTIAL/PLATELET
Basophils Absolute: 0 10*3/uL (ref 0.0–0.1)
Basophils Relative: 0.7 % (ref 0.0–3.0)
Eosinophils Absolute: 0.1 10*3/uL (ref 0.0–0.7)
Eosinophils Relative: 2 % (ref 0.0–5.0)
HCT: 36.4 % (ref 36.0–46.0)
Hemoglobin: 11.7 g/dL — ABNORMAL LOW (ref 12.0–15.0)
Lymphocytes Relative: 47.7 % — ABNORMAL HIGH (ref 12.0–46.0)
Lymphs Abs: 2.7 10*3/uL (ref 0.7–4.0)
MCHC: 32.3 g/dL (ref 30.0–36.0)
MCV: 82.2 fl (ref 78.0–100.0)
Monocytes Absolute: 0.4 10*3/uL (ref 0.1–1.0)
Monocytes Relative: 8 % (ref 3.0–12.0)
Neutro Abs: 2.3 10*3/uL (ref 1.4–7.7)
Neutrophils Relative %: 41.6 % — ABNORMAL LOW (ref 43.0–77.0)
Platelets: 264 10*3/uL (ref 150.0–400.0)
RBC: 4.42 Mil/uL (ref 3.87–5.11)
RDW: 12.8 % (ref 11.5–15.5)
WBC: 5.6 10*3/uL (ref 4.0–10.5)

## 2016-08-28 LAB — COMPREHENSIVE METABOLIC PANEL
ALT: 13 U/L (ref 0–35)
AST: 19 U/L (ref 0–37)
Albumin: 4.4 g/dL (ref 3.5–5.2)
Alkaline Phosphatase: 37 U/L — ABNORMAL LOW (ref 39–117)
BUN: 9 mg/dL (ref 6–23)
CO2: 31 mEq/L (ref 19–32)
Calcium: 9.9 mg/dL (ref 8.4–10.5)
Chloride: 107 mEq/L (ref 96–112)
Creatinine, Ser: 0.85 mg/dL (ref 0.40–1.20)
GFR: 85.52 mL/min (ref >60.00)
Glucose, Bld: 97 mg/dL (ref 70–99)
Potassium: 3.8 mEq/L (ref 3.5–5.1)
Sodium: 142 mEq/L (ref 135–145)
Total Bilirubin: 0.4 mg/dL (ref 0.2–1.2)
Total Protein: 6.9 g/dL (ref 6.0–8.3)

## 2016-08-28 LAB — HEPATITIS C ANTIBODY: HCV Ab: NEGATIVE

## 2016-08-28 LAB — LIPID PANEL
Cholesterol: 159 mg/dL (ref 0–200)
HDL: 47.5 mg/dL (ref >39.00)
LDL Cholesterol: 85 mg/dL (ref 0–99)
NonHDL: 111.82
Total CHOL/HDL Ratio: 3
Triglycerides: 136 mg/dL (ref 0.0–149.0)
VLDL: 27.2 mg/dL (ref 0.0–40.0)

## 2016-08-28 LAB — TSH: TSH: 0.83 u[IU]/mL (ref 0.35–4.50)

## 2016-08-28 MED ORDER — ALPRAZOLAM 0.5 MG PO TABS: 0.5000 mg | ORAL_TABLET | Freq: Every evening | ORAL | 5 refills | Status: DC | PRN

## 2016-08-28 MED ORDER — ESTRADIOL 1 MG PO TABS: 0.5000 mg | ORAL_TABLET | ORAL | 1 refills | Status: DC

## 2016-08-28 NOTE — Assessment & Plan Note (Addendum)
She takes one xanax a day - sometimes takes it during day - sometimes at night night, but only takes one a day Will continue

## 2016-08-28 NOTE — Patient Instructions (Addendum)
Try colace/dulcolax - take three pills a day. Try senna or senokot - 2 pills at night.    Test(s) ordered today. Your results will be released to [place] (or called to you) after review, usually within 72hours after test completion. If any changes need to be made, you will be notified at that same time.  All other Health Maintenance issues reviewed.   All recommended immunizations and age-appropriate screenings are up-to-date or discussed.  No immunizations administered today.   Medications reviewed and updated.  Changes include decreasing the estrace to 0.5 mg every other day.  Your prescription(s) have been submitted to your pharmacy. Please take as directed and contact our office if you believe you are having problem(s) with the medication(s).   Please followup in 6 months   Health Maintenance, Female Adopting a healthy lifestyle and getting preventive care can go a long way to promote health and wellness. Talk with your health care provider about what schedule of regular examinations is right for you. This is a good chance for you to check in with your provider about disease prevention and staying healthy. In between checkups, there are plenty of things you can do on your own. Experts have done a lot of research about which lifestyle changes and preventive measures are most likely to keep you healthy. Ask your health care provider for more information. Weight and diet Eat a healthy diet  Be sure to include plenty of vegetables, fruits, low-fat dairy products, and lean protein.  Do not eat a lot of foods high in solid fats, added sugars, or salt.  Get regular exercise. This is one of the most important things you can do for your health.  Most adults should exercise for at least 150 minutes each week. The exercise should increase your heart rate and make you sweat (moderate-intensity exercise).  Most adults should also do strengthening exercises at least twice a week. This is in  addition to the moderate-intensity exercise. Maintain a healthy weight  Body mass index (BMI) is a measurement that can be used to identify possible weight problems. It estimates body fat based on height and weight. Your health care provider can help determine your BMI and help you achieve or maintain a healthy weight.  For females 51 years of age and older:  A BMI below 18.5 is considered underweight.  A BMI of 18.5 to 24.9 is normal.  A BMI of 25 to 29.9 is considered overweight.  A BMI of 30 and above is considered obese. Watch levels of cholesterol and blood lipids  You should start having your blood tested for lipids and cholesterol at 68 years of age, then have this test every 5 years.  You may need to have your cholesterol levels checked more often if:  Your lipid or cholesterol levels are high.  You are older than 68 years of age.  You are at high risk for heart disease. Cancer screening Lung Cancer  Lung cancer screening is recommended for adults 59-27 years old who are at high risk for lung cancer because of a history of smoking.  A yearly low-dose CT scan of the lungs is recommended for people who:  Currently smoke.  Have quit within the past 15 years.  Have at least a 30-pack-year history of smoking. A pack year is smoking an average of one pack of cigarettes a day for 1 year.  Yearly screening should continue until it has been 15 years since you quit.  Yearly screening should stop  if you develop a health problem that would prevent you from having lung cancer treatment. Breast Cancer  Practice breast self-awareness. This means understanding how your breasts normally appear and feel.  It also means doing regular breast self-exams. Let your health care provider know about any changes, no matter how small.  If you are in your 20s or 30s, you should have a clinical breast exam (CBE) by a health care provider every 1-3 years as part of a regular health  exam.  If you are 5 or older, have a CBE every year. Also consider having a breast X-ray (mammogram) every year.  If you have a family history of breast cancer, talk to your health care provider about genetic screening.  If you are at high risk for breast cancer, talk to your health care provider about having an MRI and a mammogram every year.  Breast cancer gene (BRCA) assessment is recommended for women who have family members with BRCA-related cancers. BRCA-related cancers include:  Breast.  Ovarian.  Tubal.  Peritoneal cancers.  Results of the assessment will determine the need for genetic counseling and BRCA1 and BRCA2 testing. Cervical Cancer  Your health care provider may recommend that you be screened regularly for cancer of the pelvic organs (ovaries, uterus, and vagina). This screening involves a pelvic examination, including checking for microscopic changes to the surface of your cervix (Pap test). You may be encouraged to have this screening done every 3 years, beginning at age 9.  For women ages 47-65, health care providers may recommend pelvic exams and Pap testing every 3 years, or they may recommend the Pap and pelvic exam, combined with testing for human papilloma virus (HPV), every 5 years. Some types of HPV increase your risk of cervical cancer. Testing for HPV may also be done on women of any age with unclear Pap test results.  Other health care providers may not recommend any screening for nonpregnant women who are considered low risk for pelvic cancer and who do not have symptoms. Ask your health care provider if a screening pelvic exam is right for you.  If you have had past treatment for cervical cancer or a condition that could lead to cancer, you need Pap tests and screening for cancer for at least 20 years after your treatment. If Pap tests have been discontinued, your risk factors (such as having a new sexual partner) need to be reassessed to determine if  screening should resume. Some women have medical problems that increase the chance of getting cervical cancer. In these cases, your health care provider may recommend more frequent screening and Pap tests. Colorectal Cancer  This type of cancer can be detected and often prevented.  Routine colorectal cancer screening usually begins at 68 years of age and continues through 68 years of age.  Your health care provider may recommend screening at an earlier age if you have risk factors for colon cancer.  Your health care provider may also recommend using home test kits to check for hidden blood in the stool.  A small camera at the end of a tube can be used to examine your colon directly (sigmoidoscopy or colonoscopy). This is done to check for the earliest forms of colorectal cancer.  Routine screening usually begins at age 39.  Direct examination of the colon should be repeated every 5-10 years through 68 years of age. However, you may need to be screened more often if early forms of precancerous polyps or small growths are found.  Skin Cancer  Check your skin from head to toe regularly.  Tell your health care provider about any new moles or changes in moles, especially if there is a change in a mole's shape or color.  Also tell your health care provider if you have a mole that is larger than the size of a pencil eraser.  Always use sunscreen. Apply sunscreen liberally and repeatedly throughout the day.  Protect yourself by wearing long sleeves, pants, a wide-brimmed hat, and sunglasses whenever you are outside. Heart disease, diabetes, and high blood pressure  High blood pressure causes heart disease and increases the risk of stroke. High blood pressure is more likely to develop in:  People who have blood pressure in the high end of the normal range (130-139/85-89 mm Hg).  People who are overweight or obese.  People who are African American.  If you are 76-39 years of age, have your  blood pressure checked every 3-5 years. If you are 31 years of age or older, have your blood pressure checked every year. You should have your blood pressure measured twice-once when you are at a hospital or clinic, and once when you are not at a hospital or clinic. Record the average of the two measurements. To check your blood pressure when you are not at a hospital or clinic, you can use:  An automated blood pressure machine at a pharmacy.  A home blood pressure monitor.  If you are between 65 years and 96 years old, ask your health care provider if you should take aspirin to prevent strokes.  Have regular diabetes screenings. This involves taking a blood sample to check your fasting blood sugar level.  If you are at a normal weight and have a low risk for diabetes, have this test once every three years after 68 years of age.  If you are overweight and have a high risk for diabetes, consider being tested at a younger age or more often. Preventing infection Hepatitis B  If you have a higher risk for hepatitis B, you should be screened for this virus. You are considered at high risk for hepatitis B if:  You were born in a country where hepatitis B is common. Ask your health care provider which countries are considered high risk.  Your parents were born in a high-risk country, and you have not been immunized against hepatitis B (hepatitis B vaccine).  You have HIV or AIDS.  You use needles to inject street drugs.  You live with someone who has hepatitis B.  You have had sex with someone who has hepatitis B.  You get hemodialysis treatment.  You take certain medicines for conditions, including cancer, organ transplantation, and autoimmune conditions. Hepatitis C  Blood testing is recommended for:  Everyone born from [birth year] through 1967.  Anyone with known risk factors for hepatitis C. Sexually transmitted infections (STIs)  You should be screened for sexually transmitted  infections (STIs) including gonorrhea and chlamydia if:  You are sexually active and are younger than 68 years of age.  You are older than 68 years of age and your health care provider tells you that you are at risk for this type of infection.  Your sexual activity has changed since you were last screened and you are at an increased risk for chlamydia or gonorrhea. Ask your health care provider if you are at risk.  If you do not have HIV, but are at risk, it may be recommended that you take a prescription  medicine daily to prevent HIV infection. This is called pre-exposure prophylaxis (PrEP). You are considered at risk if:  You are sexually active and do not regularly use condoms or know the HIV status of your partner(s).  You take drugs by injection.  You are sexually active with a partner who has HIV. Talk with your health care provider about whether you are at high risk of being infected with HIV. If you choose to begin PrEP, you should first be tested for HIV. You should then be tested every 3 months for as long as you are taking PrEP. Pregnancy  If you are premenopausal and you may become pregnant, ask your health care provider about preconception counseling.  If you may become pregnant, take 400 to 800 micrograms (mcg) of folic acid every day.  If you want to prevent pregnancy, talk to your health care provider about birth control (contraception). Osteoporosis and menopause  Osteoporosis is a disease in which the bones lose minerals and strength with aging. This can result in serious bone fractures. Your risk for osteoporosis can be identified using a bone density scan.  If you are 10 years of age or older, or if you are at risk for osteoporosis and fractures, ask your health care provider if you should be screened.  Ask your health care provider whether you should take a calcium or vitamin D supplement to lower your risk for osteoporosis.  Menopause may have certain physical  symptoms and risks.  Hormone replacement therapy may reduce some of these symptoms and risks. Talk to your health care provider about whether hormone replacement therapy is right for you. Follow these instructions at home:  Schedule regular health, dental, and eye exams.  Stay current with your immunizations.  Do not use any tobacco products including cigarettes, chewing tobacco, or electronic cigarettes.  If you are pregnant, do not drink alcohol.  If you are breastfeeding, limit how much and how often you drink alcohol.  Limit alcohol intake to no more than 1 drink per day for nonpregnant women. One drink equals 12 ounces of beer, 5 ounces of wine, or 1 ounces of hard liquor.  Do not use street drugs.  Do not share needles.  Ask your health care provider for help if you need support or information about quitting drugs.  Tell your health care provider if you often feel depressed.  Tell your health care provider if you have ever been abused or do not feel safe at home. This information is not intended to replace advice given to you by your health care provider. Make sure you discuss any questions you have with your health care provider. Document Released: 12/30/2010 Document Revised: 11/22/2015 Document Reviewed: 03/20/2015 Elsevier Interactive Patient Education  2017 [employer].

## 2016-08-28 NOTE — Progress Notes (Signed)
Subjective:    Patient ID: Hannah [name], female    DOB: 11-27-1950, 66 y.o.   MRN: [id number]  HPI She is here for a physical exam.   Her feet are cool to touch.  She denies pain or numbness/tingling in the feet.    She is still smoking, but smokes less - a pack may last one week.    She has been having intermittent lower abdominal pain.  She has some constipation - she is unsure if the pain is related to constipation.  She denies nausea, blood in the stool, dysuria and hematuria.  She will try paying more attention to when she has the discomfort.   She is not currently exercising.     Medications and allergies reviewed with patient and updated if appropriate.  Patient Active Problem List   Diagnosis Date Noted  . GERD (gastroesophageal reflux disease) 02/27/2016  . Hormone replacement therapy (HRT) 02/27/2016  . Anxiety 02/27/2016  . Osteoarthritis of left knee 02/19/2014  . S/P total knee replacement 02/13/2014  . RBBB 02/08/2014  . HTN (hypertension) 02/08/2014  . PVD - bilateral 60-79% carotid strenosis 02/08/2014  . Mixed hyperlipidemia 11/29/2013  . Carpal tunnel syndrome, bilateral 11/23/2013  . DJD (degenerative joint disease) 11/23/2013  . Murmur- mild -mod AR, mild MR 11/10/2013  . Acne 08/26/2013  . Chronic constipation 12/16/2012  . Arthritis 10/08/2012  . Nicotine abuse 12/29/2011    Current Outpatient Prescriptions on File Prior to Visit  Medication Sig Dispense Refill  . ALPRAZolam (XANAX) 0.5 MG tablet Take 1 tablet (0.5 mg total) by mouth at bedtime as needed. for sleep 30 tablet 5  . aspirin 81 MG tablet Take 81 mg by mouth daily.    . bisoprolol-hydrochlorothiazide (ZIAC) 5-6.25 MG tablet Take 0.5 tablets by mouth daily. 45 tablet 2  . cholecalciferol (VITAMIN D) 1000 UNITS tablet Take 1 tablet (1,000 Units total) by mouth daily. 30 tablet 5  . estradiol (ESTRACE) 1 MG tablet Take 1 tablet (1 mg total) by mouth every other day. 90 tablet 1  .  fenofibrate 160 MG tablet Take 1 tablet (160 mg total) by mouth daily. 90 tablet 3  . omeprazole (PRILOSEC) 40 MG capsule Take 1 capsule (40 mg total) by mouth 2 (two) times daily before lunch and supper. 180 capsule 2  . OVER THE COUNTER MEDICATION 1 tablet as needed. OTC stool softener prn    . pravastatin (PRAVACHOL) 20 MG tablet Take 1 tablet (20 mg total) by mouth every evening. 90 tablet 2  . Probiotic Product (PROBIOTIC DAILY PO) Take 1 capsule by mouth daily.     No current facility-administered medications on file prior to visit.     Past Medical History:  Diagnosis Date  . Allergy   . Anxiety   . Arthritis   . Carpal tunnel syndrome   . Depression   . Dyslipidemia   . Heart murmur    mild-moderate AR  . Hyperlipidemia    on meds   . Hypertension   . PVD (peripheral vascular disease) (HCC)    moderate carotid disease  . RBBB   . Smoker     Past Surgical History:  Procedure Laterality Date  . ABDOMINAL HYSTERECTOMY  1994  . COLONOSCOPY    . COLONOSCOPY W/ POLYPECTOMY  2009  . HEMORRHOID SURGERY    . SPINE SURGERY  08/16/2009  . TOTAL KNEE ARTHROPLASTY Left 02/13/2014   Procedure: TOTAL KNEE ARTHROPLASTY;  Surgeon: Alta Corning, MD;  Location: [place];  Service: Orthopedics;  Laterality: Left;  . TUBAL LIGATION      Social History   Social History  . Marital status: Divorced    Spouse name: N/A  . Number of children: N/A  . Years of education: N/A   Social History Main Topics  . Smoking status: Light Tobacco Smoker    Packs/day: 0.50    Years: 40.00    Types: Cigarettes  . Smokeless tobacco: Never Used     Comment: PACK WILL LAST 4 DAYS  . Alcohol use No  . Drug use: No  . Sexual activity: No   Other Topics Concern  . None   Social History Narrative  . None    Family History  Problem Relation Age of Onset  . Cancer Father   . Prostate cancer Father   . Diabetes Sister   . Stroke Maternal Grandfather   . Colon cancer Neg Hx   . Colon polyps  Neg Hx   . Rectal cancer Neg Hx   . Stomach cancer Neg Hx   . Esophageal cancer Neg Hx     Review of Systems  Constitutional: Negative for chills and fever.  Eyes: Negative for visual disturbance.  Respiratory: Negative for cough, shortness of breath and wheezing.   Cardiovascular: Negative for chest pain, palpitations and leg swelling.  Gastrointestinal: Positive for constipation. Negative for abdominal pain, blood in stool, diarrhea and nausea.       No gerd  Endocrine: Positive for cold intolerance.  Genitourinary: Negative for dysuria and hematuria.  Musculoskeletal: Positive for arthralgias (arthritis). Negative for back pain.  Skin: Positive for color change (left elbow is darker - sometimes a little tender, no swelling). Negative for rash.  Neurological: Negative for light-headedness and headaches.  Psychiatric/Behavioral: Negative for dysphoric mood. The patient is not nervous/anxious.        Objective:   Vitals:   08/28/16 0825  BP: 138/70  Pulse: (!) 58  Resp: 16  Temp: 98.2 F (36.8 C)   Filed Weights   08/28/16 0825  Weight: 125 lb (56.7 kg)   Body mass index is 24.41 kg/m.  Wt Readings from Last 3 Encounters:  08/28/16 125 lb (56.7 kg)  04/24/16 122 lb (55.3 kg)  04/10/16 122 lb (55.3 kg)     Physical Exam Constitutional: She appears well-developed and well-nourished. No distress.  HENT:  Head: Normocephalic and atraumatic.  Right Ear: External ear normal. Normal ear canal and TM Left Ear: External ear normal.  Normal ear canal and TM Mouth/Throat: Oropharynx is clear and moist.  Eyes: Conjunctivae and EOM are normal.  Neck: Neck supple. No tracheal deviation present. No thyromegaly present.  No carotid bruit  Cardiovascular: Normal rate, regular rhythm and normal heart sounds.   2/6 systolic murmur heard.  No edema. Pulmonary/Chest: Effort normal and breath sounds normal. No respiratory distress. She has no wheezes. She has no rales.  Breast:  deferred  Abdominal: Soft. She exhibits no distension. There is no tenderness.  no mass. Lymphadenopathy: She has no cervical adenopathy.  Skin: Skin is warm and dry. She is not diaphoretic. left elbow skin is darker than right and slightly thicker/dryer, no swelling or tenderness Psychiatric: She has a normal mood and affect. Her behavior is normal.         Assessment & Plan:   Physical exam: Screening blood work  ordered Immunizations  Flu vaccine deferred, other vaccines up to date Colonoscopy   Up to date  Mammogram  Up to date  Gyn - does not see gyn Dexa  Never had one -- ordered Eye exams  Up to date  EKG - done by cardio 2017 Exercise - none - stressed regular exercise Weight   -  Normal BMI Skin - dark area on left elbow - ? Cause - may see derm Substance abuse - smokes cigarettes  - stressed cessation  See Problem List for Assessment and Plan of chronic medical problems.

## 2016-08-28 NOTE — Progress Notes (Signed)
Pre visit review using our clinic review tool, if applicable. No additional management support is needed unless otherwise documented below in the visit note. 

## 2016-08-29 ENCOUNTER — Encounter: Payer: Self-pay | Admitting: Cardiology

## 2016-08-30 MED ORDER — ESTRADIOL 0.5 MG PO TABS: 0.5000 mg | ORAL_TABLET | ORAL | Status: DC

## 2016-08-30 NOTE — Assessment & Plan Note (Signed)
Discussed risk of HRT with smoking Need to slowly taper her off Decrease estrace to 0.5 mg QOD - slowly taper from there.

## 2016-08-30 NOTE — Assessment & Plan Note (Signed)
Stressed smoking cessation

## 2016-08-30 NOTE — Assessment & Plan Note (Signed)
GERD controlled Continue daily medication  

## 2016-08-30 NOTE — Assessment & Plan Note (Signed)
Check lipid panel  Continue daily statin Regular exercise and healthy diet encouraged  

## 2016-08-30 NOTE — Assessment & Plan Note (Signed)
Normal colonoscopy  Lower abdominal pain may be related to constipation Working on bowel regimen - increase water, fiber and exercise

## 2016-08-30 NOTE — Assessment & Plan Note (Signed)
BP controlled Current regimen effective and well tolerated Continue current medications at current doses Check labs

## 2016-09-02 ENCOUNTER — Ambulatory Visit (INDEPENDENT_AMBULATORY_CARE_PROVIDER_SITE_OTHER)
Admission: RE | Admit: 2016-09-02 | Discharge: 2016-09-02 | Disposition: A | Discharge status: Home / Self Care | Payer: Medicare HMO | Source: Ambulatory Visit | Attending: Internal Medicine | Admitting: Internal Medicine

## 2016-09-02 DIAGNOSIS — Z13820 Encounter for screening for osteoporosis: Secondary | ICD-10-CM | POA: Diagnosis not present

## 2016-09-04 NOTE — Progress Notes (Signed)
HPI The patient presents for follow up of PVD and a murmur.  She had mild sclerosis on her aortic valve with mild aortic  nsufficiency. She did have carotid artery disease as described below.  Since I last saw her she has been exercising and doing well.  The patient denies any new symptoms such as chest discomfort, neck or arm discomfort. There has been no new shortness of breath, PND or orthopnea. There have been no reported palpitations, presyncope or syncope.   She is still smoking.    Allergies  Allergen Reactions  . Amlodipine     Rash, swelling  . Chantix [Varenicline Tartrate] Other (See Comments)    Tongue swell,sob  . Clarithromycin Rash  . Lisinopril Hives  . Simvastatin Hives  . Wellbutrin [Bupropion Hcl] Hives  . Lipitor [Atorvastatin]     Dizziness per patient    Current Outpatient Prescriptions  Medication Sig Dispense Refill  . ALPRAZolam (XANAX) 0.5 MG tablet Take 1 tablet (0.5 mg total) by mouth at bedtime as needed. for sleep 30 tablet 5  . aspirin 81 MG tablet Take 81 mg by mouth daily.    . bisoprolol-hydrochlorothiazide (ZIAC) 5-6.25 MG tablet Take 0.5 tablets by mouth daily. 45 tablet 2  . cholecalciferol (VITAMIN D) 1000 UNITS tablet Take 1 tablet (1,000 Units total) by mouth daily. 30 tablet 5  . estradiol (ESTRACE) 0.5 MG tablet Take 1 tablet (0.5 mg total) by mouth every other day.    . fenofibrate 160 MG tablet Take 1 tablet (160 mg total) by mouth daily. 90 tablet 3  . omeprazole (PRILOSEC) 40 MG capsule Take 1 capsule (40 mg total) by mouth 2 (two) times daily before lunch and supper. 180 capsule 2  . OVER THE COUNTER MEDICATION 1 tablet as needed. OTC stool softener prn    . pravastatin (PRAVACHOL) 20 MG tablet Take 1 tablet (20 mg total) by mouth every evening. 90 tablet 2  . Probiotic Product (PROBIOTIC DAILY PO) Take 1 capsule by mouth daily.     No current facility-administered medications for this visit.     Past Medical History:  Diagnosis  Date  . Allergy   . Anxiety   . Arthritis   . Carpal tunnel syndrome   . Depression   . Dyslipidemia   . Heart murmur    mild-moderate AR  . Hyperlipidemia    on meds   . Hypertension   . PVD (peripheral vascular disease) (HCC)    moderate carotid disease  . RBBB   . Smoker     Past Surgical History:  Procedure Laterality Date  . ABDOMINAL HYSTERECTOMY  1994  . COLONOSCOPY    . COLONOSCOPY W/ POLYPECTOMY  2009  . HEMORRHOID SURGERY    . SPINE SURGERY  08/16/2009  . TOTAL KNEE ARTHROPLASTY Left 02/13/2014   Procedure: TOTAL KNEE ARTHROPLASTY;  Surgeon: Alta Corning, MD;  Location: [place];  Service: Orthopedics;  Laterality: Left;  . TUBAL LIGATION       ROS:  As stated in the HPI and negative for all other systems.  PHYSICAL EXAM BP 130/72   Pulse (!) 58   Ht '5\' 1"'$  (1.549 m)   Wt 129 lb (58.5 kg)   BMI 24.37 kg/m  GENERAL:  Well appearing NECK:  No jugular venous distention, waveform within normal limits, carotid upstroke brisk and symmetric, soft right systolic bruit versus transmitted murmur no thyromegaly and LUNGS:  Clear to auscultation bilaterally CHEST:  Unremarkable HEART:  PMI  not displaced or sustained,S1 and S2 within normal limits, no S3, no S4, no clicks, no rubs, 2/6 apical systolic murmur radiating slightly out the aortic outflow tract murmurs ABD:  Flat, positive bowel sounds normal in frequency in pitch, no bruits, no rebound, no guarding, no midline pulsatile mass, no hepatomegaly, no splenomegaly EXT:  2 plus pulses upper with decreased DP/PT bilaterally, no edema, no cyanosis no clubbing, left femoral bruit.  NEURO:  Nonfocal   EKG:  Sinus rhythm, rate 53, right bundle branch block, left anterior fascicular block, no acute ST-T wave changes.       Lab Results  Component Value Date   CHOL 159 08/28/2016   TRIG 136.0 08/28/2016   HDL 47.50 08/28/2016   LDLCALC 85 08/28/2016   LDLDIRECT 87.0 11/09/2014     ASSESSMENT AND PLAN  MURMUR:   This is mild aortic sclerosis and AI.  No follow up imaging is indicated at this point.   RBBB:   No further evaluation is indicated.     CAROTID STENOSIS:   She has 40 - 59% right carotid stenosis and 60 - 79% left stenosis.  I will follow her in six months.   DYSLIPIDEMIA:  She has had much improvement with follow up in our lipid clinic with results as above.    TOBACCO:  She will now think about quitting and we discussed specifics again today.   BRADYCARDIA:  She tolerates this.  No change in therapy is planned.   HYPERTENSION:  Her BP is OK.  No change in therapy is planned.

## 2016-09-05 ENCOUNTER — Encounter: Payer: Self-pay | Admitting: Cardiology

## 2016-09-05 ENCOUNTER — Ambulatory Visit (INDEPENDENT_AMBULATORY_CARE_PROVIDER_SITE_OTHER): Payer: Medicare HMO | Admitting: Cardiology

## 2016-09-05 VITALS — BP 130/72 | HR 58 | Ht 61.0 in | Wt 129.0 lb

## 2016-09-05 DIAGNOSIS — Z720 Tobacco use: Secondary | ICD-10-CM | POA: Diagnosis not present

## 2016-09-05 DIAGNOSIS — I779 Disorder of arteries and arterioles, unspecified: Secondary | ICD-10-CM | POA: Diagnosis not present

## 2016-09-05 DIAGNOSIS — I739 Peripheral vascular disease, unspecified: Principal | ICD-10-CM

## 2016-09-05 DIAGNOSIS — I10 Essential (primary) hypertension: Secondary | ICD-10-CM | POA: Diagnosis not present

## 2016-09-05 NOTE — Patient Instructions (Signed)
Medication Instructions:  Continue current medications  Labwork: None Ordered  Testing/Procedures: Your physician has requested that you have a carotid duplex in 6 Months. This test is an ultrasound of the carotid arteries in your neck. It looks at blood flow through these arteries that supply the brain with blood. Allow one hour for this exam. There are no restrictions or special instructions.  Follow-Up: Your physician wants you to follow-up in: 1 Year. You will receive a reminder letter in the mail two months in advance. If you don't receive a letter, please call our office to schedule the follow-up appointment.   Any Other Special Instructions Will Be Listed Below (If Applicable).   If you need a refill on your cardiac medications before your next appointment, please call your pharmacy.

## 2016-10-27 ENCOUNTER — Ambulatory Visit (INDEPENDENT_AMBULATORY_CARE_PROVIDER_SITE_OTHER)
Admission: RE | Admit: 2016-10-27 | Discharge: 2016-10-27 | Disposition: A | Discharge status: Home / Self Care | Payer: Medicare HMO | Source: Ambulatory Visit | Attending: Internal Medicine | Admitting: Internal Medicine

## 2016-10-27 ENCOUNTER — Encounter: Payer: Self-pay | Admitting: Internal Medicine

## 2016-10-27 ENCOUNTER — Ambulatory Visit (INDEPENDENT_AMBULATORY_CARE_PROVIDER_SITE_OTHER): Payer: Medicare HMO | Admitting: Internal Medicine

## 2016-10-27 VITALS — BP 152/72 | HR 89 | Temp 99.0°F | Resp 16 | Ht 60.0 in | Wt 126.2 lb

## 2016-10-27 DIAGNOSIS — R05 Cough: Secondary | ICD-10-CM | POA: Diagnosis not present

## 2016-10-27 DIAGNOSIS — R059 Cough, unspecified: Secondary | ICD-10-CM

## 2016-10-27 DIAGNOSIS — J988 Other specified respiratory disorders: Secondary | ICD-10-CM

## 2016-10-27 MED ORDER — HYDROCODONE-HOMATROPINE 5-1.5 MG/5ML PO SYRP: 5.0000 mL | ORAL_SOLUTION | Freq: Three times a day (TID) | ORAL | 0 refills | Status: DC | PRN

## 2016-10-27 MED ORDER — CEFDINIR 300 MG PO CAPS: 300.0000 mg | ORAL_CAPSULE | Freq: Two times a day (BID) | ORAL | 0 refills | Status: DC

## 2016-10-27 NOTE — Progress Notes (Signed)
Subjective:  Patient ID: Hannah [name], female    DOB: [birth year]  Age: 66 y.o. MRN: [id number]  CC: Cough   HPI Hannah [name] presents for a 3 day hx of cough that is productive of thick yellow phlegm with chills, LGF, ST, and muscle aches.  Outpatient Medications Prior to Visit  Medication Sig Dispense Refill  . ALPRAZolam (XANAX) 0.5 MG tablet Take 1 tablet (0.5 mg total) by mouth at bedtime as needed. for sleep 30 tablet 5  . aspirin 81 MG tablet Take 81 mg by mouth daily.    . bisoprolol-hydrochlorothiazide (ZIAC) 5-6.25 MG tablet Take 0.5 tablets by mouth daily. 45 tablet 2  . cholecalciferol (VITAMIN D) 1000 UNITS tablet Take 1 tablet (1,000 Units total) by mouth daily. 30 tablet 5  . fenofibrate 160 MG tablet Take 1 tablet (160 mg total) by mouth daily. 90 tablet 3  . omeprazole (PRILOSEC) 40 MG capsule Take 1 capsule (40 mg total) by mouth 2 (two) times daily before lunch and supper. 180 capsule 2  . OVER THE COUNTER MEDICATION 1 tablet as needed. OTC stool softener prn    . pravastatin (PRAVACHOL) 20 MG tablet Take 1 tablet (20 mg total) by mouth every evening. 90 tablet 2  . Probiotic Product (PROBIOTIC DAILY PO) Take 1 capsule by mouth daily.    Marland Kitchen estradiol (ESTRACE) 0.5 MG tablet Take 1 tablet (0.5 mg total) by mouth every other day.     No facility-administered medications prior to visit.     ROS Review of Systems  Constitutional: Positive for chills and fever. Negative for activity change, appetite change, diaphoresis, fatigue and unexpected weight change.  HENT: Positive for sore throat. Negative for facial swelling and sinus pressure.   Eyes: Negative.   Respiratory: Positive for cough. Negative for chest tightness, shortness of breath and wheezing.   Cardiovascular: Negative.  Negative for chest pain, palpitations and leg swelling.  Gastrointestinal: Negative for abdominal pain, constipation, diarrhea, nausea and vomiting.  Endocrine: Negative.     Genitourinary: Negative.   Musculoskeletal: Negative.  Negative for back pain and myalgias.  Skin: Negative.   Allergic/Immunologic: Negative.   Neurological: Negative.  Negative for dizziness, weakness and headaches.  Hematological: Negative for adenopathy. Does not bruise/bleed easily.  Psychiatric/Behavioral: Negative.     Objective:  BP (!) 152/72 (BP Location: Left Arm, Patient Position: Sitting, Cuff Size: Normal)   Pulse 89   Temp 99 F (37.2 C) (Oral)   Resp 16   Ht 5' (1.524 m)   Wt 126 lb 3 oz (57.2 kg)   SpO2 99%   BMI 24.64 kg/m   BP Readings from Last 3 Encounters:  10/27/16 (!) 152/72  09/05/16 130/72  08/28/16 138/70    Wt Readings from Last 3 Encounters:  10/27/16 126 lb 3 oz (57.2 kg)  09/05/16 129 lb (58.5 kg)  08/28/16 125 lb (56.7 kg)    Physical Exam  Constitutional: Hannah [name] is oriented to person, place, and time.  Non-toxic appearance. Hannah [name] does not have a sickly appearance. Hannah [name] does not appear ill. No distress.  HENT:  Mouth/Throat: Oropharynx is clear and moist. No oropharyngeal exudate.  Eyes: Conjunctivae are normal. Right eye exhibits no discharge. Left eye exhibits no discharge. No scleral icterus.  Neck: Normal range of motion. Neck supple. No JVD present. No tracheal deviation present. No thyromegaly present.  Cardiovascular: Normal rate, regular rhythm, normal heart sounds and intact distal pulses.  Exam reveals no gallop and no friction rub.  No murmur heard. Pulmonary/Chest: Effort normal and breath sounds normal. No stridor. No respiratory distress. Hannah [name] has no wheezes. Hannah [name] has no rales. Hannah [name] exhibits no tenderness.  Abdominal: Soft. Bowel sounds are normal. Hannah [name] exhibits no distension and no mass. There is no tenderness. There is no rebound and no guarding.  Musculoskeletal: Normal range of motion. Hannah [name] exhibits no edema, tenderness or deformity.  Lymphadenopathy:    Hannah [name] has no cervical adenopathy.  Neurological: Hannah [name] is oriented to person,  place, and time.  Skin: Skin is warm and dry. No rash noted. Hannah [name] is not diaphoretic. No erythema. No pallor.  Vitals reviewed.   Lab Results  Component Value Date   WBC 5.6 08/28/2016   HGB 11.7 (L) 08/28/2016   HCT 36.4 08/28/2016   PLT 264.0 08/28/2016   GLUCOSE 97 08/28/2016   CHOL 159 08/28/2016   TRIG 136.0 08/28/2016   HDL 47.50 08/28/2016   LDLDIRECT 87.0 11/09/2014   LDLCALC 85 08/28/2016   ALT 13 08/28/2016   AST 19 08/28/2016   NA 142 08/28/2016   K 3.8 08/28/2016   CL 107 08/28/2016   CREATININE 0.85 08/28/2016   BUN 9 08/28/2016   CO2 31 08/28/2016   TSH 0.83 08/28/2016   INR 1.06 02/08/2014    Dg Bone Density  Result Date: 09/07/2016 Date of study: 09/02/16 Exam: DUAL X-RAY ABSORPTIOMETRY (DXA) FOR BONE MINERAL DENSITY (BMD) Instrument: [employer] Provider: PCP Indication: screening for osteoporosis Comparison: none (please note that it is not possible to compare data from different instruments) Clinical data: Pt is a postmenopausal 66 y.o. female without previous nontraumatic fractures.  Hannah [name] takes estrogen. Results:  Lumbar spine (L1-L4) Femoral neck (FN) 33% distal radius T-score 3.9 RFN:0.6 LFN:1.1 1.1 Change in BMD from previous DXA test (%) n/a n/a n/a (*) statistically significant Assessment: the BMD is normal according to the [facility] classification for osteoporosis (see below). Fracture risk: low FRAX score: not calculated due to normal BMD Comments: the technical quality of the study is good Recommend optimizing calcium (1200 mg/day) and vitamin D (800 IU/day) intake. No pharmacological treatment is indicated. Followup: Repeat BMD is appropriate after 2 years. WHO criteria for diagnosis of osteoporosis in postmenopausal women and in men 30 y/o or older: - normal: T-score -1.0 to + 1.0 - osteopenia/low bone density: T-score between -2.5 and -1.0 - osteoporosis: T-score below -2.5 - severe osteoporosis: T-score below -2.5 with history of fragility  fracture Note: although not part of the WHO classification, the presence of a fragility fracture, regardless of the T-score, should be considered diagnostic of osteoporosis, provided other causes for the fracture have been excluded. Loura Pardon MD    Assessment & Plan:   Ardyn was seen today for cough.  Diagnoses and all orders for this visit:  Cough- CXR is neg for mass/PNA/edema -     HYDROcodone-homatropine (HYCODAN) 5-1.5 MG/5ML syrup; Take 5 mLs by mouth every 8 (eight) hours as needed for cough. -     DG Chest 2 View; Future  RTI (respiratory tract infection) -     cefdinir (OMNICEF) 300 MG capsule; Take 1 capsule (300 mg total) by mouth 2 (two) times daily. -     HYDROcodone-homatropine (HYCODAN) 5-1.5 MG/5ML syrup; Take 5 mLs by mouth every 8 (eight) hours as needed for cough.   I am having Hannah [name] start on cefdinir and HYDROcodone-homatropine. I am also having her maintain her aspirin, cholecalciferol, fenofibrate, Probiotic Product (PROBIOTIC DAILY PO), OVER THE COUNTER MEDICATION, bisoprolol-hydrochlorothiazide,  omeprazole, pravastatin, ALPRAZolam, and estradiol.  Meds ordered this encounter  Medications  . estradiol (ESTRACE) 1 MG tablet  . cefdinir (OMNICEF) 300 MG capsule    Sig: Take 1 capsule (300 mg total) by mouth 2 (two) times daily.    Dispense:  20 capsule    Refill:  0  . HYDROcodone-homatropine (HYCODAN) 5-1.5 MG/5ML syrup    Sig: Take 5 mLs by mouth every 8 (eight) hours as needed for cough.    Dispense:  120 mL    Refill:  0     Follow-up: Return in about 3 weeks (around 11/17/2016).  Scarlette Calico, MD

## 2016-10-27 NOTE — Progress Notes (Signed)
Pre visit review using our clinic review tool, if applicable. No additional management support is needed unless otherwise documented below in the visit note. 

## 2016-10-27 NOTE — Patient Instructions (Signed)
Cough, Adult Coughing is a reflex that clears your throat and your airways. Coughing helps to heal and protect your lungs. It is normal to cough occasionally, but a cough that happens with other symptoms or lasts a long time may be a sign of a condition that needs treatment. A cough may last only 2-3 weeks (acute), or it may last longer than 8 weeks (chronic). What are the causes? Coughing is commonly caused by:  Breathing in substances that irritate your lungs.  A viral or bacterial respiratory infection.  Allergies.  Asthma.  Postnasal drip.  Smoking.  Acid backing up from the stomach into the esophagus (gastroesophageal reflux).  Certain medicines.  Chronic lung problems, including COPD (or rarely, lung cancer).  Other medical conditions such as heart failure.  Follow these instructions at home: Pay attention to any changes in your symptoms. Take these actions to help with your discomfort:  Take medicines only as told by your health care provider. ? If you were prescribed an antibiotic medicine, take it as told by your health care provider. Do not stop taking the antibiotic even if you start to feel better. ? Talk with your health care provider before you take a cough suppressant medicine.  Drink enough fluid to keep your urine clear or pale yellow.  If the air is dry, use a cold steam vaporizer or humidifier in your bedroom or your home to help loosen secretions.  Avoid anything that causes you to cough at work or at home.  If your cough is worse at night, try sleeping in a semi-upright position.  Avoid cigarette smoke. If you smoke, quit smoking. If you need help quitting, ask your health care provider.  Avoid caffeine.  Avoid alcohol.  Rest as needed.  Contact a health care provider if:  You have new symptoms.  You cough up pus.  Your cough does not get better after 2-3 weeks, or your cough gets worse.  You cannot control your cough with suppressant  medicines and you are losing sleep.  You develop pain that is getting worse or pain that is not controlled with pain medicines.  You have a fever.  You have unexplained weight loss.  You have night sweats. Get help right away if:  You cough up blood.  You have difficulty breathing.  Your heartbeat is very fast. This information is not intended to replace advice given to you by your health care provider. Make sure you discuss any questions you have with your health care provider. Document Released: 12/13/2010 Document Revised: 11/22/2015 Document Reviewed: 08/23/2014 Elsevier Interactive Patient Education  2017 [employer].  

## 2016-10-31 ENCOUNTER — Telehealth: Payer: Self-pay | Admitting: Internal Medicine

## 2016-10-31 NOTE — Telephone Encounter (Signed)
Patient Name: Hannah [name]  Gender: Female  DOB: 15-Nov-1950   Age: 66 Y 2 M 27 D  Return Phone Number: [phone] (Primary), [phone] (Secondary)  Address:   City/State/Zip: [place]    Client [facility] - Client  Client Site [facility] - Day  Physician Scarlette Calico - MD  Contact Type Call  Who Is Calling Patient / Member / Family / Caregiver  Call Type Triage / Clinical  Relationship To Patient Self  Return Phone Number [phone] (Primary)  Chief Complaint Cough  Reason for Call Symptomatic / Request for [place] states c/o congested cough despite medication.  Appointment Disposition EMR Caller Not Reached  Info pasted into Epic Yes  Translation No   Nurse Assessment      Guidelines      Guideline Title Affirmed Question Affirmed Notes Nurse Date/Time (Eastern Time)         Disp. Time Eilene Ghazi Time) Disposition Final User   10/31/2016 10:11:52 AM Send To Clinical Follow Up Rich Brave, Amy   10/31/2016 10:16:53 AM Attempt made - message left  Kathi Ludwig RN, Tracie   10/31/2016 10:30:13 AM Send To RN Personal  Kathi Ludwig, RN, Tracie    10/31/2016 10:26:52 AM FINAL ATTEMPT MADE - message left Yes Kathi Ludwig, RN, Leana Roe

## 2016-11-05 NOTE — Telephone Encounter (Signed)
Spoke with pt, she states she could not tolerate Coricidin. Advised her she could take a cold medicine such as mucinex without the decongestant. Or a cough medicine without a decongestant.

## 2016-11-05 NOTE — Telephone Encounter (Addendum)
Patient called back in.  States that she still has congestion and cough.  Would like to know if she needs to take a decongestant?  Can be reached at [phone].

## 2016-11-05 NOTE — Telephone Encounter (Signed)
Would take coricidin cough products for congestion and cold symptoms

## 2016-12-05 ENCOUNTER — Ambulatory Visit: Payer: Medicare HMO | Admitting: Nurse Practitioner

## 2016-12-23 ENCOUNTER — Emergency Department (HOSPITAL_COMMUNITY): Payer: Medicare HMO

## 2016-12-23 ENCOUNTER — Emergency Department (HOSPITAL_COMMUNITY)
Admission: EM | Admit: 2016-12-23 | Discharge: 2016-12-23 | Disposition: A | Discharge status: Home / Self Care | Payer: Medicare HMO | Attending: Emergency Medicine | Admitting: Emergency Medicine

## 2016-12-23 ENCOUNTER — Telehealth: Payer: Self-pay | Admitting: Internal Medicine

## 2016-12-23 ENCOUNTER — Encounter (HOSPITAL_COMMUNITY): Payer: Self-pay | Admitting: Emergency Medicine

## 2016-12-23 DIAGNOSIS — R0602 Shortness of breath: Secondary | ICD-10-CM | POA: Diagnosis not present

## 2016-12-23 DIAGNOSIS — I10 Essential (primary) hypertension: Secondary | ICD-10-CM | POA: Diagnosis not present

## 2016-12-23 DIAGNOSIS — F17210 Nicotine dependence, cigarettes, uncomplicated: Secondary | ICD-10-CM | POA: Insufficient documentation

## 2016-12-23 DIAGNOSIS — Z7982 Long term (current) use of aspirin: Secondary | ICD-10-CM | POA: Insufficient documentation

## 2016-12-23 DIAGNOSIS — R0789 Other chest pain: Secondary | ICD-10-CM | POA: Diagnosis not present

## 2016-12-23 DIAGNOSIS — R079 Chest pain, unspecified: Secondary | ICD-10-CM | POA: Diagnosis not present

## 2016-12-23 DIAGNOSIS — Z96652 Presence of left artificial knee joint: Secondary | ICD-10-CM | POA: Insufficient documentation

## 2016-12-23 LAB — CBC
HCT: 38 % (ref 36.0–46.0)
Hemoglobin: 12.2 g/dL (ref 12.0–15.0)
MCH: 27 pg (ref 26.0–34.0)
MCHC: 32.1 g/dL (ref 30.0–36.0)
MCV: 84.1 fL (ref 78.0–100.0)
Platelets: 277 10*3/uL (ref 150–400)
RBC: 4.52 MIL/uL (ref 3.87–5.11)
RDW: 13 % (ref 11.5–15.5)
WBC: 7.6 10*3/uL (ref 4.0–10.5)

## 2016-12-23 LAB — POCT I-STAT TROPONIN I
Troponin i, poc: 0 ng/mL (ref 0.00–0.08)
Troponin i, poc: 0.01 ng/mL (ref 0.00–0.08)

## 2016-12-23 LAB — BASIC METABOLIC PANEL
Anion gap: 7 (ref 5–15)
BUN: 10 mg/dL (ref 6–20)
CO2: 29 mmol/L (ref 22–32)
Calcium: 10.5 mg/dL — ABNORMAL HIGH (ref 8.9–10.3)
Chloride: 105 mmol/L (ref 101–111)
Creatinine, Ser: 0.89 mg/dL (ref 0.44–1.00)
GFR calc Af Amer: >60 mL/min (ref >60)
GFR calc non Af Amer: >60 mL/min (ref >60)
Glucose, Bld: 109 mg/dL — ABNORMAL HIGH (ref 65–99)
Potassium: 3.9 mmol/L (ref 3.5–5.1)
Sodium: 141 mmol/L (ref 135–145)

## 2016-12-23 MED ORDER — TRAMADOL HCL 50 MG PO TABS
50.0000 mg | ORAL_TABLET | Freq: Once | ORAL | Status: AC
  Administered 2016-12-23: 50 mg via ORAL
  Filled 2016-12-23: qty 1

## 2016-12-23 MED ORDER — TRAMADOL HCL 50 MG PO TABS: 50.0000 mg | ORAL_TABLET | Freq: Four times a day (QID) | ORAL | 0 refills | Status: DC | PRN

## 2016-12-23 NOTE — ED Triage Notes (Signed)
Pt c/o left lower chest pain, SOB, left arm and neck pain onset yesterday, worsened today with new symptoms of racing heart and headache. No injury. Chest wall tender to palpation, small palpable mass at site of pain.

## 2016-12-23 NOTE — Telephone Encounter (Signed)
Pt agreed to go to ED - agree with evaluation in ED

## 2016-12-23 NOTE — Telephone Encounter (Signed)
Patient Name: Hannah [name]  DOB: 1950-07-11    Initial Comment Caller has discomfort in her chest and shoulder   Nurse Assessment  Nurse: Arthor Captain, RN, Margaret Date/Time (Eastern Time): 12/23/2016 4:07:47 PM  Confirm and document reason for call. If symptomatic, describe symptoms. ---Caller states that she is having chest discomfort in the center of her chest and discomfort in right shoulder. Started yesterday. Feels sore in the center of her chest. Pain is constant. No hx of heart disease and no SOB.  Does the patient have any new or worsening symptoms? ---Yes  Will a triage be completed? ---Yes  Related visit to physician within the last 2 weeks? ---No  Does the PT have any chronic conditions? (i.e. diabetes, asthma, etc.) ---Yes  List chronic conditions. ---hypertension  Is this a behavioral health or substance abuse call? ---No     Guidelines    Guideline Title Affirmed Question Affirmed Notes  Chest Pain Pain also present in shoulder(s) or arm(s) or jaw (Exception: pain is clearly made worse by movement)    Final Disposition User   Go to ED Now Cockrum, RN, Kendall    Referrals  [facility] - [facility]   Disagree/Comply: Comply

## 2016-12-23 NOTE — ED Provider Notes (Signed)
[facility] DEPT Provider Note   CSN: [id number] Arrival date & time: 12/23/16  1732     History   Chief Complaint Chief Complaint  Patient presents with  . Chest Pain    HPI Hannah [name] is a 66 y.o. female.  HPI Patient presents with central chest pain starting yesterday. Describes the pain as heaviness. Denies any trauma to the chest. No heavy lifting. Has had some mild shortness of breath. Denies cough, fever or chills. No new lower extremity swelling or pain. Patient states she's been taking half of her prescribed dose of Ziac. Was evaluated by cardiology earlier this year and had a normal stress test. Past Medical History:  Diagnosis Date  . Allergy   . Anxiety   . Arthritis   . Carpal tunnel syndrome   . Depression   . Dyslipidemia   . Heart murmur    mild-moderate AR  . Hyperlipidemia    on meds   . Hypertension   . PVD (peripheral vascular disease) (HCC)    moderate carotid disease  . RBBB   . Smoker     Patient Active Problem List   Diagnosis Date Noted  . RTI (respiratory tract infection) 10/27/2016  . Cough 03/13/2016  . GERD (gastroesophageal reflux disease) 02/27/2016  . Hormone replacement therapy (HRT) 02/27/2016  . Anxiety 02/27/2016  . Osteoarthritis of left knee 02/19/2014  . S/P total knee replacement 02/13/2014  . RBBB 02/08/2014  . HTN (hypertension) 02/08/2014  . PVD - bilateral 60-79% carotid strenosis 02/08/2014  . Mixed hyperlipidemia 11/29/2013  . Carpal tunnel syndrome, bilateral 11/23/2013  . DJD (degenerative joint disease) 11/23/2013  . Murmur- mild -mod AR, mild MR 11/10/2013  . Acne 08/26/2013  . Chronic constipation 12/16/2012  . Arthritis 10/08/2012  . Nicotine abuse 12/29/2011    Past Surgical History:  Procedure Laterality Date  . ABDOMINAL HYSTERECTOMY  1994  . COLONOSCOPY    . COLONOSCOPY W/ POLYPECTOMY  2009  . HEMORRHOID SURGERY    . SPINE SURGERY  08/16/2009  . TOTAL KNEE ARTHROPLASTY Left 02/13/2014   Procedure: TOTAL KNEE ARTHROPLASTY;  Surgeon: Alta Corning, MD;  Location: [facility];  Service: Orthopedics;  Laterality: Left;  . TUBAL LIGATION      OB History    No data available       Home Medications    Prior to Admission medications   Medication Sig Start Date End Date Taking? Authorizing Provider  ALPRAZolam Duanne Moron) 0.5 MG tablet Take 1 tablet (0.5 mg total) by mouth at bedtime as needed. for sleep 08/28/16  Yes Burns, Claudina Lick, MD  aspirin 81 MG tablet Take 81 mg by mouth daily.   Yes [provider]  bisoprolol-hydrochlorothiazide (ZIAC) 5-6.25 MG tablet Take 0.5 tablets by mouth daily. 06/02/16  Yes Burns, Claudina Lick, MD  cholecalciferol (VITAMIN D) 1000 UNITS tablet Take 1 tablet (1,000 Units total) by mouth daily. 05/30/14  Yes English, Colletta Maryland D, PA  estradiol (ESTRACE) 1 MG tablet Take 1 mg by mouth daily.  10/03/16  Yes [provider]  fenofibrate 160 MG tablet Take 1 tablet (160 mg total) by mouth daily. 02/27/16  Yes Burns, Claudina Lick, MD  fexofenadine (ALLEGRA) 180 MG tablet Take 180 mg by mouth daily.   Yes [provider]  omeprazole (PRILOSEC) 40 MG capsule Take 1 capsule (40 mg total) by mouth 2 (two) times daily before lunch and supper. 06/02/16  Yes Burns, Claudina Lick, MD  pravastatin (PRAVACHOL) 20 MG tablet Take  1 tablet (20 mg total) by mouth every evening. 06/02/16  Yes Burns, Claudina Lick, MD  Probiotic Product (PROBIOTIC DAILY PO) Take 1 capsule by mouth daily.   Yes [provider]  cefdinir (OMNICEF) 300 MG capsule Take 1 capsule (300 mg total) by mouth 2 (two) times daily. Patient not taking: Reported on 12/23/2016 10/27/16   Janith Lima, MD  HYDROcodone-homatropine [facility]) 5-1.5 MG/5ML syrup Take 5 mLs by mouth every 8 (eight) hours as needed for cough. Patient not taking: Reported on 12/23/2016 10/27/16   Janith Lima, MD  traMADol (ULTRAM) 50 MG tablet Take 1 tablet (50 mg total) by mouth every 6 (six) hours as needed for moderate pain  or severe pain. 12/23/16   Julianne Rice, MD    Family History Family History  Problem Relation Age of Onset  . Cancer Father   . Prostate cancer Father   . Diabetes Sister   . Stroke Maternal Grandfather   . Colon cancer Neg Hx   . Colon polyps Neg Hx   . Rectal cancer Neg Hx   . Stomach cancer Neg Hx   . Esophageal cancer Neg Hx     Social History Social History  Substance Use Topics  . Smoking status: Light Tobacco Smoker    Packs/day: 0.50    Years: 40.00    Types: Cigarettes  . Smokeless tobacco: Never Used     Comment: PACK WILL LAST 4 DAYS  . Alcohol use No     Allergies   Amlodipine; Chantix [varenicline tartrate]; Clarithromycin; Lisinopril; Simvastatin; Wellbutrin [bupropion hcl]; and Lipitor [atorvastatin]   Review of Systems Review of Systems  Constitutional: Negative for chills and fever.  HENT: Negative for congestion and facial swelling.   Respiratory: Positive for shortness of breath. Negative for cough and wheezing.   Cardiovascular: Positive for chest pain. Negative for palpitations and leg swelling.  Gastrointestinal: Negative for abdominal pain, constipation, diarrhea, nausea and vomiting.  Genitourinary: Negative for dysuria, flank pain, frequency and hematuria.  Musculoskeletal: Negative for back pain, neck pain and neck stiffness.  Skin: Negative for rash and wound.  Neurological: Negative for dizziness, weakness, light-headedness, numbness and headaches.  All other systems reviewed and are negative.    Physical Exam Updated Vital Signs BP (!) 184/88 (BP Location: Left Arm)   Pulse (!) 56   Temp 97.8 F (36.6 C) (Oral)   Resp 20   SpO2 100%   Physical Exam  Constitutional: She is oriented to person, place, and time. She appears well-developed and well-nourished. No distress.  HENT:  Head: Normocephalic and atraumatic.  Mouth/Throat: Oropharynx is clear and moist. No oropharyngeal exudate.  Eyes: EOM are normal. Pupils are equal,  round, and reactive to light.  Neck: Normal range of motion. Neck supple. No JVD present.  Cardiovascular: Regular rhythm.  Exam reveals no gallop and no friction rub.   No murmur heard. Bradycardia  Pulmonary/Chest: Effort normal and breath sounds normal. No respiratory distress. She has no wheezes. She has no rales. She exhibits tenderness.  Chest pain is completely produced with palpation of the inferior sternum. No crepitance or deformity.  Abdominal: Soft. Bowel sounds are normal. There is no tenderness. There is no rebound and no guarding.  Musculoskeletal: Normal range of motion. She exhibits no edema or tenderness.  No lower extremity swelling, asymmetry or tenderness. No midline thoracic or lumbar tenderness. No CVA tenderness bilaterally.  Neurological: She is alert and oriented to person, place, and time.  Skin: Skin is  warm and dry. Capillary refill takes less than 2 seconds. No rash noted. No erythema.  Psychiatric: She has a normal mood and affect. Her behavior is normal.  Nursing note and vitals reviewed.    ED Treatments / Results  Labs (all labs ordered are listed, but only abnormal results are displayed) Labs Reviewed  BASIC METABOLIC PANEL - Abnormal; Notable for the following:       Result Value   Glucose, Bld 109 (*)    Calcium 10.5 (*)    All other components within normal limits  CBC  I-STAT TROPOININ, ED  POCT I-STAT TROPONIN I  I-STAT TROPOININ, ED  POCT I-STAT TROPONIN I    EKG  EKG Interpretation  Date/Time:  Tuesday December 23 2016 17:57:56 EDT Ventricular Rate:  53 PR Interval:    QRS Duration: 138 QT Interval:  470 QTC Calculation: 442 R Axis:   -44 Text Interpretation:  Sinus rhythm Left atrial enlargement RBBB and LAFB Left ventricular hypertrophy Confirmed by Lita Mains  MD, Dulse Rutan ([id number]) on 12/23/2016 8:57:07 PM       Radiology Dg Chest 2 View  Result Date: 12/23/2016 CLINICAL DATA:  Left lower lobe chest pain with shortness of breath.  EXAM: CHEST  2 VIEW COMPARISON:  10/27/2016 FINDINGS: Heart size upper normal. The lungs are clear without focal pneumonia, edema, pneumothorax or pleural effusion. The visualized bony structures of the thorax are intact. IMPRESSION: No acute findings. Electronically Signed   By: Misty Stanley M.D.   On: 12/23/2016 18:35    Procedures Procedures (including critical care time)  Medications Ordered in ED Medications  traMADol (ULTRAM) tablet 50 mg (50 mg Oral Given 12/23/16 2227)     Initial Impression / Assessment and Plan / ED Course  I have reviewed the triage vital signs and the nursing notes.  Pertinent labs & imaging results that were available during my care of the patient were reviewed by me and considered in my medical decision making (see chart for details).    Chest pain is very atypical for corneal artery disease. Has troponin 2 which is normal and EKG unchanged from her previous. Her chest pain is reproduced with palpation over her sternum. Will treat symptomatically. Have advised following up with her primary physician. Return precautions have been given.   Final Clinical Impressions(s) / ED Diagnoses   Final diagnoses:  Chest wall pain    New Prescriptions New Prescriptions   TRAMADOL (ULTRAM) 50 MG TABLET    Take 1 tablet (50 mg total) by mouth every 6 (six) hours as needed for moderate pain or severe pain.     Julianne Rice, MD 12/23/16 [phone]

## 2016-12-28 NOTE — Progress Notes (Addendum)
Subjective:    Patient ID: Hannah [name], female    DOB: August 29, 1950, 66 y.o.   MRN: [id number]  HPI The patient is here for follow up from the ED  She went to the ED 6/26 for chest pain. The past started the day prior and was located in her central chest.  She described the pain as a heaviness associated with mild SOB.  She denies any trauma or injury.  She denied cough, fever, chills, leg swelling.  She had a normal stress test earlier this year.  She had only been taking 1/2 of her Ziac dose, but has been on this dose for a while.    BP in the ED was 184/88.  Chest pain was reproduced with palpation of inferior sternum.  She did not have any LE edema.  She received tramadol 50 mg x 1 in the ED.  She had troponin x 2 negative. Her CMP, CBC were normal.  Her CXR was normal, EKG was unchanged from prior.  Her pain was very atypical.  She was discharge home with a prescription for tramadol.  She used all the tramadol and wonders if she can have more.  She has also been taking advil.     Her pain is almost gone.  The pain felt like it was on the outside, not on the inside.  It is a soreness.   Anxiety:  She has had increased anxiety.  She takes xanax at night and sleeps ok as long as she takes it.  She feels anxious during the day.  She denies depression.  She thinks she needs something during the day.  She wonders if she needs to go back to see her psychiatrist.   Hypertension: She is taking her medication daily. She is compliant with a low sodium diet.  She denies chest pain, palpitations, edema, shortness of breath and regular headaches. She is exercising regularly.  She does not monitor her blood pressure at home.     Medications and allergies reviewed with patient and updated if appropriate.  Patient Active Problem List   Diagnosis Date Noted  . RTI (respiratory tract infection) 10/27/2016  . Cough 03/13/2016  . GERD (gastroesophageal reflux disease) 02/27/2016  . Hormone replacement  therapy (HRT) 02/27/2016  . Anxiety 02/27/2016  . Osteoarthritis of left knee 02/19/2014  . S/P total knee replacement 02/13/2014  . RBBB 02/08/2014  . HTN (hypertension) 02/08/2014  . PVD - bilateral 60-79% carotid strenosis 02/08/2014  . Mixed hyperlipidemia 11/29/2013  . Carpal tunnel syndrome, bilateral 11/23/2013  . DJD (degenerative joint disease) 11/23/2013  . Murmur- mild -mod AR, mild MR 11/10/2013  . Acne 08/26/2013  . Chronic constipation 12/16/2012  . Arthritis 10/08/2012  . Nicotine abuse 12/29/2011    Current Outpatient Prescriptions on File Prior to Visit  Medication Sig Dispense Refill  . ALPRAZolam (XANAX) 0.5 MG tablet Take 1 tablet (0.5 mg total) by mouth at bedtime as needed. for sleep 30 tablet 5  . aspirin 81 MG tablet Take 81 mg by mouth daily.    . bisoprolol-hydrochlorothiazide (ZIAC) 5-6.25 MG tablet Take 0.5 tablets by mouth daily. 45 tablet 2  . cholecalciferol (VITAMIN D) 1000 UNITS tablet Take 1 tablet (1,000 Units total) by mouth daily. 30 tablet 5  . estradiol (ESTRACE) 1 MG tablet Take 1 mg by mouth daily.     . fenofibrate 160 MG tablet Take 1 tablet (160 mg total) by mouth daily. 90 tablet 3  . fexofenadine (ALLEGRA)  180 MG tablet Take 180 mg by mouth daily.    Marland Kitchen omeprazole (PRILOSEC) 40 MG capsule Take 1 capsule (40 mg total) by mouth 2 (two) times daily before lunch and supper. 180 capsule 2  . pravastatin (PRAVACHOL) 20 MG tablet Take 1 tablet (20 mg total) by mouth every evening. 90 tablet 2  . Probiotic Product (PROBIOTIC DAILY PO) Take 1 capsule by mouth daily.     No current facility-administered medications on file prior to visit.     Past Medical History:  Diagnosis Date  . Allergy   . Anxiety   . Arthritis   . Carpal tunnel syndrome   . Depression   . Dyslipidemia   . Heart murmur    mild-moderate AR  . Hyperlipidemia    on meds   . Hypertension   . PVD (peripheral vascular disease) (HCC)    moderate carotid disease  . RBBB    . Smoker     Past Surgical History:  Procedure Laterality Date  . ABDOMINAL HYSTERECTOMY  1994  . COLONOSCOPY    . COLONOSCOPY W/ POLYPECTOMY  2009  . HEMORRHOID SURGERY    . SPINE SURGERY  08/16/2009  . TOTAL KNEE ARTHROPLASTY Left 02/13/2014   Procedure: TOTAL KNEE ARTHROPLASTY;  Surgeon: Alta Corning, MD;  Location: [facility];  Service: Orthopedics;  Laterality: Left;  . TUBAL LIGATION      Social History   Social History  . Marital status: Divorced    Spouse name: N/A  . Number of children: N/A  . Years of education: N/A   Social History Main Topics  . Smoking status: Light Tobacco Smoker    Packs/day: 0.50    Years: 40.00    Types: Cigarettes  . Smokeless tobacco: Never Used     Comment: PACK WILL LAST 4 DAYS  . Alcohol use No  . Drug use: No  . Sexual activity: No   Other Topics Concern  . None   Social History Narrative   No regular exercise    Family History  Problem Relation Age of Onset  . Cancer Father   . Prostate cancer Father   . Diabetes Sister   . Stroke Maternal Grandfather   . Colon cancer Neg Hx   . Colon polyps Neg Hx   . Rectal cancer Neg Hx   . Stomach cancer Neg Hx   . Esophageal cancer Neg Hx     Review of Systems  Constitutional: Negative for chills and fever.  Respiratory: Negative for cough, shortness of breath and wheezing.   Cardiovascular: Positive for chest pain and palpitations (occ, not often). Negative for leg swelling.  Gastrointestinal:       GERD controlled  Neurological: Negative for light-headedness and headaches.  Psychiatric/Behavioral: Negative for dysphoric mood. The patient is nervous/anxious.        Objective:   Vitals:   12/29/16 1014  BP: 126/70  Pulse: 62  Resp: 16  Temp: 98 F (36.7 C)   Wt Readings from Last 3 Encounters:  12/29/16 125 lb (56.7 kg)  10/27/16 126 lb 3 oz (57.2 kg)  09/05/16 129 lb (58.5 kg)   Body mass index is 24.41 kg/m.   Physical Exam    Constitutional: Appears  well-developed and well-nourished. No distress.  HENT:  Head: Normocephalic and atraumatic.  Neck: Neck supple. No tracheal deviation present. No thyromegaly present.  No cervical lymphadenopathy Cardiovascular: Normal rate, regular rhythm and normal heart sounds.   3/6 systolic murmur heard. No  carotid bruit .  No edema Pulmonary/Chest: upper- mid sternum tender on left edge with palpation,  Effort normal and breath sounds normal. No respiratory distress. No has no wheezes. No rales.  Skin: Skin is warm and dry. Not diaphoretic.  Psychiatric: anxious mood and affect. Behavior is normal.      Assessment & Plan:    See Problem List for Assessment and Plan of chronic medical problems.

## 2016-12-29 ENCOUNTER — Encounter: Payer: Self-pay | Admitting: Internal Medicine

## 2016-12-29 ENCOUNTER — Ambulatory Visit (INDEPENDENT_AMBULATORY_CARE_PROVIDER_SITE_OTHER): Payer: Medicare HMO | Admitting: Internal Medicine

## 2016-12-29 VITALS — BP 126/70 | HR 62 | Temp 98.0°F | Resp 16 | Wt 125.0 lb

## 2016-12-29 DIAGNOSIS — F419 Anxiety disorder, unspecified: Secondary | ICD-10-CM | POA: Diagnosis not present

## 2016-12-29 DIAGNOSIS — R079 Chest pain, unspecified: Secondary | ICD-10-CM | POA: Diagnosis not present

## 2016-12-29 DIAGNOSIS — Z720 Tobacco use: Secondary | ICD-10-CM

## 2016-12-29 DIAGNOSIS — Z79890 Hormone replacement therapy (postmenopausal): Secondary | ICD-10-CM

## 2016-12-29 DIAGNOSIS — I10 Essential (primary) hypertension: Secondary | ICD-10-CM

## 2016-12-29 MED ORDER — FLUOXETINE HCL 20 MG PO CAPS: 20.0000 mg | ORAL_CAPSULE | Freq: Every day | ORAL | 5 refills | Status: DC

## 2016-12-29 NOTE — Assessment & Plan Note (Signed)
BP was very high in ED, but well controlled here - advised her to monitor Will recheck BP at her next visit Continue current medication

## 2016-12-29 NOTE — Patient Instructions (Addendum)
  No immunizations administered today.   Medications reviewed and updated.  Changes include starting prozac for anxiety.   Your prescription(s) have been submitted to your pharmacy. Please take as directed and contact our office if you believe you are having problem(s) with the medication(s).   Please followup in 6 weeks

## 2016-12-29 NOTE — Assessment & Plan Note (Signed)
Not controlled Continue xanax at bedtime - has been on this for years Start prozac daily 20 mg Follow up in 6 weeks, sooner if needed

## 2016-12-29 NOTE — Assessment & Plan Note (Signed)
Evaluation in ED negative for cardiac or pulmonary cause - likely MSK in nature Finished tramadol - does not need additional rx for tramadol - she was under the impression this was the anti-inflammatory Continue advil

## 2016-12-29 NOTE — Assessment & Plan Note (Signed)
Taking 0.5 mg - not always taking daily Stressed tapering off the medication - discussed increased risk of blood clots

## 2016-12-29 NOTE — Assessment & Plan Note (Signed)
Stressed smoking cessation - discussed in detail consequences of smoking

## 2017-01-21 DIAGNOSIS — Z1231 Encounter for screening mammogram for malignant neoplasm of breast: Secondary | ICD-10-CM | POA: Diagnosis not present

## 2017-01-21 LAB — HM MAMMOGRAPHY

## 2017-02-10 ENCOUNTER — Encounter: Payer: Self-pay | Admitting: Internal Medicine

## 2017-02-10 ENCOUNTER — Ambulatory Visit: Payer: Medicare HMO | Admitting: Internal Medicine

## 2017-02-25 ENCOUNTER — Other Ambulatory Visit: Payer: Self-pay | Admitting: Internal Medicine

## 2017-03-03 ENCOUNTER — Ambulatory Visit: Payer: Medicare HMO | Admitting: Internal Medicine

## 2017-03-08 NOTE — Patient Instructions (Addendum)
  Test(s) ordered today. Your results will be released to [facility] (or called to you) after review, usually within 72hours after test completion. If any changes need to be made, you will be notified at that same time.  All other Health Maintenance issues reviewed.   All recommended immunizations and age-appropriate screenings are up-to-date or discussed.  No immunizations administered today.   Medications reviewed and updated.  Changes include increasing xanax to twice daily as needed.   Your prescription(s) have been submitted to your pharmacy. Please take as directed and contact our office if you believe you are having problem(s) with the medication(s).   Please followup in 6 months

## 2017-03-08 NOTE — Progress Notes (Signed)
Subjective:    Patient ID: Hannah [name], female    DOB: Sep 03, 1950, 66 y.o.   MRN: [id number]  HPI The patient is here for follow up.  Hypertension: She is taking her medication daily. She is compliant with a low sodium diet.  She denies chest pain, palpitations, edema, shortness of breath and regular headaches. She is exercising regularly - twice a week.  She does monitor her blood pressure at home periodically.    Hyperlipidemia: She is taking her medication daily. She is compliant with a low fat/cholesterol diet. She is exercising regularly. She denies myalgias.   GERD:  She is taking her medication daily as prescribed.  She denies any GERD symptoms and feels her GERD is well controlled.   Anxiety: She is taking her medication daily as prescribed. She denies any side effects from the medication. She feels her anxiety is well controlled and she is happy with her current dose of medication.   Hyperglycemia:  She has a family history of diabetes.  She is exercise and eats healthy.   Medications and allergies reviewed with patient and updated if appropriate.  Patient Active Problem List   Diagnosis Date Noted  . Chest pain 12/29/2016  . GERD (gastroesophageal reflux disease) 02/27/2016  . Hormone replacement therapy (HRT) 02/27/2016  . Anxiety 02/27/2016  . Osteoarthritis of left knee 02/19/2014  . S/P total knee replacement 02/13/2014  . RBBB 02/08/2014  . HTN (hypertension) 02/08/2014  . PVD - bilateral 60-79% carotid strenosis 02/08/2014  . Mixed hyperlipidemia 11/29/2013  . Carpal tunnel syndrome, bilateral 11/23/2013  . DJD (degenerative joint disease) 11/23/2013  . Murmur- mild -mod AR, mild MR 11/10/2013  . Chronic constipation 12/16/2012  . Arthritis 10/08/2012  . Nicotine abuse 12/29/2011    Current Outpatient Prescriptions on File Prior to Visit  Medication Sig Dispense Refill  . ALPRAZolam (XANAX) 0.5 MG tablet Take 1 tablet (0.5 mg total) by mouth at bedtime  as needed. for sleep 30 tablet 5  . aspirin 81 MG tablet Take 81 mg by mouth daily.    . bisoprolol-hydrochlorothiazide (ZIAC) 5-6.25 MG tablet Take 0.5 tablets by mouth daily. 45 tablet 2  . cholecalciferol (VITAMIN D) 1000 UNITS tablet Take 1 tablet (1,000 Units total) by mouth daily. 30 tablet 5  . estradiol (ESTRACE) 1 MG tablet Take 1 mg by mouth daily.     . fenofibrate 160 MG tablet Take 1 tablet (160 mg total) by mouth daily. 90 tablet 3  . fexofenadine (ALLEGRA) 180 MG tablet Take 180 mg by mouth daily.    Marland Kitchen FLUoxetine (PROZAC) 20 MG capsule Take 1 capsule (20 mg total) by mouth daily. 30 capsule 5  . omeprazole (PRILOSEC) 40 MG capsule TAKE 1 CAPSULE (40 MG TOTAL) BY MOUTH 2 (TWO) TIMES DAILY BEFORE LUNCH AND SUPPER. 180 capsule 1  . pravastatin (PRAVACHOL) 20 MG tablet Take 1 tablet (20 mg total) by mouth every evening. 90 tablet 2  . Probiotic Product (PROBIOTIC DAILY PO) Take 1 capsule by mouth daily.     No current facility-administered medications on file prior to visit.     Past Medical History:  Diagnosis Date  . Allergy   . Anxiety   . Arthritis   . Carpal tunnel syndrome   . Depression   . Dyslipidemia   . Heart murmur    mild-moderate AR  . Hyperlipidemia    on meds   . Hypertension   . PVD (peripheral vascular disease) ([facility])  moderate carotid disease  . RBBB   . Smoker     Past Surgical History:  Procedure Laterality Date  . ABDOMINAL HYSTERECTOMY  1994  . COLONOSCOPY    . COLONOSCOPY W/ POLYPECTOMY  2009  . HEMORRHOID SURGERY    . SPINE SURGERY  08/16/2009  . TOTAL KNEE ARTHROPLASTY Left 02/13/2014   Procedure: TOTAL KNEE ARTHROPLASTY;  Surgeon: Alta Corning, MD;  Location: [facility];  Service: Orthopedics;  Laterality: Left;  . TUBAL LIGATION      Social History   Social History  . Marital status: Divorced    Spouse name: N/A  . Number of children: N/A  . Years of education: N/A   Social History Main Topics  . Smoking status: Light Tobacco  Smoker    Packs/day: 0.50    Years: 40.00    Types: Cigarettes  . Smokeless tobacco: Never Used     Comment: PACK WILL LAST 4 DAYS  . Alcohol use No  . Drug use: No  . Sexual activity: No   Other Topics Concern  . Not on file   Social History Narrative   No regular exercise    Family History  Problem Relation Age of Onset  . Cancer Father   . Prostate cancer Father   . Diabetes Sister   . Stroke Maternal Grandfather   . Colon cancer Neg Hx   . Colon polyps Neg Hx   . Rectal cancer Neg Hx   . Stomach cancer Neg Hx   . Esophageal cancer Neg Hx     Review of Systems  Constitutional: Negative for chills and fever.  Respiratory: Negative for cough, shortness of breath and wheezing.   Cardiovascular: Negative for chest pain, palpitations and leg swelling.  Neurological: Negative for light-headedness and headaches.       Objective:   Vitals:   03/09/17 1623  BP: (!) 142/74  Pulse: 83  Resp: 16  Temp: 98.3 F (36.8 C)  SpO2: 98%   Wt Readings from Last 3 Encounters:  03/09/17 124 lb (56.2 kg)  12/29/16 125 lb (56.7 kg)  10/27/16 126 lb 3 oz (57.2 kg)   Body mass index is 24.22 kg/m.   Physical Exam    Constitutional: Appears well-developed and well-nourished. No distress.  HENT:  Head: Normocephalic and atraumatic.  Neck: Neck supple. No tracheal deviation present. No thyromegaly present.  No cervical lymphadenopathy Cardiovascular: Normal rate, regular rhythm and normal heart sounds.   No murmur heard. No carotid bruit .  No edema Pulmonary/Chest: Effort normal and breath sounds normal. No respiratory distress. No has no wheezes. No rales.  Skin: Skin is warm and dry. Not diaphoretic.  Psychiatric: Normal mood and affect. Behavior is normal.      Assessment & Plan:    See Problem List for Assessment and Plan of chronic medical problems.

## 2017-03-09 ENCOUNTER — Other Ambulatory Visit (INDEPENDENT_AMBULATORY_CARE_PROVIDER_SITE_OTHER): Payer: Medicare HMO

## 2017-03-09 ENCOUNTER — Ambulatory Visit (INDEPENDENT_AMBULATORY_CARE_PROVIDER_SITE_OTHER): Payer: Medicare HMO | Admitting: Internal Medicine

## 2017-03-09 ENCOUNTER — Encounter: Payer: Self-pay | Admitting: Internal Medicine

## 2017-03-09 VITALS — BP 142/74 | HR 83 | Temp 98.3°F | Resp 16 | Wt 124.0 lb

## 2017-03-09 DIAGNOSIS — K219 Gastro-esophageal reflux disease without esophagitis: Secondary | ICD-10-CM | POA: Diagnosis not present

## 2017-03-09 DIAGNOSIS — I10 Essential (primary) hypertension: Secondary | ICD-10-CM

## 2017-03-09 DIAGNOSIS — E782 Mixed hyperlipidemia: Secondary | ICD-10-CM

## 2017-03-09 DIAGNOSIS — F419 Anxiety disorder, unspecified: Secondary | ICD-10-CM

## 2017-03-09 DIAGNOSIS — E1169 Type 2 diabetes mellitus with other specified complication: Secondary | ICD-10-CM | POA: Insufficient documentation

## 2017-03-09 DIAGNOSIS — E119 Type 2 diabetes mellitus without complications: Secondary | ICD-10-CM | POA: Insufficient documentation

## 2017-03-09 DIAGNOSIS — R739 Hyperglycemia, unspecified: Secondary | ICD-10-CM | POA: Diagnosis not present

## 2017-03-09 DIAGNOSIS — R7303 Prediabetes: Secondary | ICD-10-CM

## 2017-03-09 LAB — LIPID PANEL
Cholesterol: 126 mg/dL (ref 0–200)
HDL: 41.1 mg/dL (ref >39.00)
LDL Cholesterol: 57 mg/dL (ref 0–99)
NonHDL: 85.21
Total CHOL/HDL Ratio: 3
Triglycerides: 139 mg/dL (ref 0.0–149.0)
VLDL: 27.8 mg/dL (ref 0.0–40.0)

## 2017-03-09 LAB — COMPREHENSIVE METABOLIC PANEL
ALT: 15 U/L (ref 0–35)
AST: 22 U/L (ref 0–37)
Albumin: 4.4 g/dL (ref 3.5–5.2)
Alkaline Phosphatase: 32 U/L — ABNORMAL LOW (ref 39–117)
BUN: 8 mg/dL (ref 6–23)
CO2: 31 mEq/L (ref 19–32)
Calcium: 10 mg/dL (ref 8.4–10.5)
Chloride: 105 mEq/L (ref 96–112)
Creatinine, Ser: 0.9 mg/dL (ref 0.40–1.20)
GFR: 79.94 mL/min (ref >60.00)
Glucose, Bld: 102 mg/dL — ABNORMAL HIGH (ref 70–99)
Potassium: 3.4 mEq/L — ABNORMAL LOW (ref 3.5–5.1)
Sodium: 143 mEq/L (ref 135–145)
Total Bilirubin: 0.3 mg/dL (ref 0.2–1.2)
Total Protein: 6.7 g/dL (ref 6.0–8.3)

## 2017-03-09 LAB — HEMOGLOBIN A1C: Hgb A1c MFr Bld: 6.4 % (ref 4.6–6.5)

## 2017-03-09 MED ORDER — PRAVASTATIN SODIUM 20 MG PO TABS: 20.0000 mg | ORAL_TABLET | Freq: Every evening | ORAL | 2 refills | Status: DC

## 2017-03-09 MED ORDER — FENOFIBRATE 160 MG PO TABS: 160.0000 mg | ORAL_TABLET | Freq: Every day | ORAL | 3 refills | Status: DC

## 2017-03-09 MED ORDER — ALPRAZOLAM 0.5 MG PO TABS: 0.5000 mg | ORAL_TABLET | Freq: Two times a day (BID) | ORAL | 5 refills | Status: DC | PRN

## 2017-03-09 MED ORDER — BISOPROLOL-HYDROCHLOROTHIAZIDE 5-6.25 MG PO TABS: 0.5000 | ORAL_TABLET | Freq: Every day | ORAL | 2 refills | Status: DC

## 2017-03-09 NOTE — Assessment & Plan Note (Signed)
Sister had diabetes Hyperglycemia - check a1c

## 2017-03-09 NOTE — Assessment & Plan Note (Signed)
GERD controlled Continue daily medication  

## 2017-03-09 NOTE — Assessment & Plan Note (Addendum)
Did not tolerate prozac -  Felt funny Taking xanax for years and anxiety not controlled  -- taking 1/4 of a pill throughout the day - taking 1 pill a day max Discussed options - will increase xanax to BID prn - she is low risk for abuse - she will only take as needed

## 2017-03-09 NOTE — Assessment & Plan Note (Addendum)
Lipids have been controlled Check lipids Continue statin

## 2017-03-09 NOTE — Assessment & Plan Note (Signed)
BP Readings from Last 3 Encounters:  03/09/17 (!) 142/74  12/29/16 126/70  12/23/16 (!) 143/81    BP well controlled Current regimen effective and well tolerated Continue current medications at current doses cmp

## 2017-03-11 ENCOUNTER — Ambulatory Visit (HOSPITAL_COMMUNITY)
Admission: RE | Admit: 2017-03-11 | Discharge: 2017-03-11 | Disposition: A | Discharge status: Home / Self Care | Payer: Medicare HMO | Source: Ambulatory Visit | Attending: Cardiovascular Disease | Admitting: Cardiovascular Disease

## 2017-03-11 ENCOUNTER — Encounter (HOSPITAL_COMMUNITY): Payer: Medicare HMO

## 2017-03-11 DIAGNOSIS — I779 Disorder of arteries and arterioles, unspecified: Secondary | ICD-10-CM | POA: Diagnosis not present

## 2017-03-11 DIAGNOSIS — I739 Peripheral vascular disease, unspecified: Secondary | ICD-10-CM

## 2017-03-13 ENCOUNTER — Ambulatory Visit: Payer: Medicare HMO | Admitting: Internal Medicine

## 2017-04-23 ENCOUNTER — Other Ambulatory Visit: Payer: Self-pay | Admitting: *Deleted

## 2017-04-23 DIAGNOSIS — I6523 Occlusion and stenosis of bilateral carotid arteries: Secondary | ICD-10-CM

## 2017-04-28 DIAGNOSIS — M79672 Pain in left foot: Secondary | ICD-10-CM | POA: Diagnosis not present

## 2017-04-28 DIAGNOSIS — M1711 Unilateral primary osteoarthritis, right knee: Secondary | ICD-10-CM | POA: Diagnosis not present

## 2017-04-28 DIAGNOSIS — M79641 Pain in right hand: Secondary | ICD-10-CM | POA: Diagnosis not present

## 2017-06-18 ENCOUNTER — Telehealth: Payer: Self-pay | Admitting: Internal Medicine

## 2017-06-18 NOTE — Telephone Encounter (Signed)
Copied from [place]. Topic: Quick Communication - See Telephone Encounter >> Jun 18, 2017 10:22 AM Cleaster Corin, NT wrote: CRM for notification. See Telephone encounter for:   06/18/17. Pt. Calling to get a refill on Estrace 1 mg tablet  [place], [place] [address] Phone: [phone] Fax: [phone] Not a 24 hour pharmacy; exact hours not known

## 2017-06-18 NOTE — Telephone Encounter (Signed)
Please call her - at our last visit she was taking 0.5 mg and trying to taper off -- what is she taking now.    She does need to taper off of it -- the risks of the medication at this point are becoming much higher. There is a 0.5 mg pill

## 2017-06-18 NOTE — Telephone Encounter (Signed)
Do not see active order. Last OV 08/28/16 dose was decreased to 0.5 mg and was supposed to taper to 0.5 every other day. Needs clarification, sent back to provider

## 2017-06-19 MED ORDER — ESTRADIOL 0.5 MG PO TABS: 0.5000 mg | ORAL_TABLET | Freq: Every day | ORAL | 0 refills | Status: DC

## 2017-06-19 NOTE — Telephone Encounter (Signed)
rx for 0.5 mg sent - she should continue to taper off of it

## 2017-06-19 NOTE — Telephone Encounter (Signed)
Notified pt w/MD response. Pt states she was taken 1/2 of the 1 mg every other day, but she is completely out so at this time she is not taking anything...Johny Chess

## 2017-06-19 NOTE — Telephone Encounter (Signed)
Notified pt w/MD response.../lmb 

## 2017-07-23 NOTE — Progress Notes (Signed)
Subjective:    Patient ID: Hannah [name], female    DOB: 07-22-1950, 67 y.o.   MRN: [id number]  HPI She is here for an acute visit.    Neck pain:  Her pain started in mid December.  She denies any injury.  She has been taking advil and using Aspercreme.  It is not working.  She has pain from right base of her skull down her right arm to the elbow.  The pain is constant.  The pain seems to be worse at night.  She denies any numbness, tingling or weakness in the arm or hand.  She is not experience any related headaches, lightheadedness/dizziness.  Her ears do feel stopped up  Her ears do feel stopped up.  She denies any ear pain.  Turning her head she does not feel stiffness, but she does hear cracking.    Medications and allergies reviewed with patient and updated if appropriate.  Patient Active Problem List   Diagnosis Date Noted  . Hyperglycemia 03/09/2017  . Prediabetes 03/09/2017  . Chest pain 12/29/2016  . GERD (gastroesophageal reflux disease) 02/27/2016  . Hormone replacement therapy (HRT) 02/27/2016  . Anxiety 02/27/2016  . Osteoarthritis of left knee 02/19/2014  . S/P total knee replacement 02/13/2014  . RBBB 02/08/2014  . HTN (hypertension) 02/08/2014  . PVD - bilateral 60-79% carotid strenosis 02/08/2014  . Mixed hyperlipidemia 11/29/2013  . Carpal tunnel syndrome, bilateral 11/23/2013  . DJD (degenerative joint disease) 11/23/2013  . Murmur- mild -mod AR, mild MR 11/10/2013  . Chronic constipation 12/16/2012  . Arthritis 10/08/2012  . Nicotine abuse 12/29/2011    Current Outpatient Medications on File Prior to Visit  Medication Sig Dispense Refill  . ALPRAZolam (XANAX) 0.5 MG tablet Take 1 tablet (0.5 mg total) by mouth 2 (two) times daily as needed. for sleep 60 tablet 5  . aspirin 81 MG tablet Take 81 mg by mouth daily.    . bisoprolol-hydrochlorothiazide (ZIAC) 5-6.25 MG tablet Take 0.5 tablets by mouth daily. 45 tablet 2  . cholecalciferol (VITAMIN D)  1000 UNITS tablet Take 1 tablet (1,000 Units total) by mouth daily. 30 tablet 5  . estradiol (ESTRACE) 0.5 MG tablet Take 1 tablet (0.5 mg total) by mouth daily. 90 tablet 0  . fenofibrate 160 MG tablet Take 1 tablet (160 mg total) by mouth daily. 90 tablet 3  . fexofenadine (ALLEGRA) 180 MG tablet Take 180 mg by mouth daily.    Marland Kitchen omeprazole (PRILOSEC) 40 MG capsule TAKE 1 CAPSULE (40 MG TOTAL) BY MOUTH 2 (TWO) TIMES DAILY BEFORE LUNCH AND SUPPER. 180 capsule 1  . pravastatin (PRAVACHOL) 20 MG tablet Take 1 tablet (20 mg total) by mouth every evening. 90 tablet 2  . Probiotic Product (PROBIOTIC DAILY PO) Take 1 capsule by mouth daily.     No current facility-administered medications on file prior to visit.     Past Medical History:  Diagnosis Date  . Allergy   . Anxiety   . Arthritis   . Carpal tunnel syndrome   . Depression   . Dyslipidemia   . Heart murmur    mild-moderate AR  . Hyperlipidemia    on meds   . Hypertension   . PVD (peripheral vascular disease) (HCC)    moderate carotid disease  . RBBB   . Smoker     Past Surgical History:  Procedure Laterality Date  . ABDOMINAL HYSTERECTOMY  1994  . COLONOSCOPY    . COLONOSCOPY W/ POLYPECTOMY  2009  . HEMORRHOID SURGERY    . SPINE SURGERY  08/16/2009  . TOTAL KNEE ARTHROPLASTY Left 02/13/2014   Procedure: TOTAL KNEE ARTHROPLASTY;  Surgeon: Alta Corning, MD;  Location: [facility];  Service: Orthopedics;  Laterality: Left;  . TUBAL LIGATION      Social History   Socioeconomic History  . Marital status: Divorced    Spouse name: Not on file  . Number of children: Not on file  . Years of education: Not on file  . Highest education level: Not on file  Social Needs  . Financial resource strain: Not on file  . Food insecurity - worry: Not on file  . Food insecurity - inability: Not on file  . Transportation needs - medical: Not on file  . Transportation needs - non-medical: Not on file  Occupational History  . Not on file    Tobacco Use  . Smoking status: Light Tobacco Smoker    Packs/day: 0.50    Years: 40.00    Pack years: 20.00    Types: Cigarettes  . Smokeless tobacco: Never Used  . Tobacco comment: PACK WILL LAST 4 DAYS  Substance and Sexual Activity  . Alcohol use: No  . Drug use: No  . Sexual activity: No  Other Topics Concern  . Not on file  Social History Narrative   No regular exercise    Family History  Problem Relation Age of Onset  . Cancer Father   . Prostate cancer Father   . Diabetes Sister   . Stroke Maternal Grandfather   . Colon cancer Neg Hx   . Colon polyps Neg Hx   . Rectal cancer Neg Hx   . Stomach cancer Neg Hx   . Esophageal cancer Neg Hx     Review of Systems  Constitutional: Negative for chills and fever.  Musculoskeletal: Positive for neck pain. Negative for neck stiffness.  Neurological: Negative for dizziness, light-headedness and headaches.       Objective:   Vitals:   07/24/17 1051  BP: 124/70  Pulse: 65  Resp: 16  Temp: 98.4 F (36.9 C)  SpO2: 99%   Wt Readings from Last 3 Encounters:  07/24/17 121 lb (54.9 kg)  03/09/17 124 lb (56.2 kg)  12/29/16 125 lb (56.7 kg)   Body mass index is 23.63 kg/m.   Physical Exam  Constitutional: She appears well-developed. No distress.  Musculoskeletal: She exhibits no edema.  Mild tenderness with palpation right SCM muscle, right trapezius from base of scale to shoulder; no increase pain with head movements, no cervical spine tenderness with palpation  Neurological: No cranial nerve deficit.  Normal sensation and strength bilateral upper extremities  Skin: Skin is warm and dry. She is not diaphoretic. No erythema.           Assessment & Plan:    See Problem List for Assessment and Plan of chronic medical problems.

## 2017-07-24 ENCOUNTER — Encounter: Payer: Self-pay | Admitting: Internal Medicine

## 2017-07-24 ENCOUNTER — Ambulatory Visit (INDEPENDENT_AMBULATORY_CARE_PROVIDER_SITE_OTHER): Payer: Medicare HMO | Admitting: Internal Medicine

## 2017-07-24 VITALS — BP 124/70 | HR 65 | Temp 98.4°F | Resp 16 | Wt 121.0 lb

## 2017-07-24 DIAGNOSIS — S161XXA Strain of muscle, fascia and tendon at neck level, initial encounter: Secondary | ICD-10-CM | POA: Insufficient documentation

## 2017-07-24 MED ORDER — METHOCARBAMOL 500 MG PO TABS: 500.0000 mg | ORAL_TABLET | Freq: Four times a day (QID) | ORAL | 0 refills | Status: DC | PRN

## 2017-07-24 MED ORDER — PREDNISONE 20 MG PO TABS: 20.0000 mg | ORAL_TABLET | Freq: Every day | ORAL | 0 refills | Status: DC

## 2017-07-24 MED ORDER — MELOXICAM 15 MG PO TABS: 15.0000 mg | ORAL_TABLET | Freq: Every day | ORAL | 0 refills | Status: DC

## 2017-07-24 NOTE — Assessment & Plan Note (Signed)
Neck pain is likely muscular in nature She does have some underlying arthritis We will try Robaxin and an anti-inflammatory-prednisone 20 mg daily for 4 days then meloxicam 50 mg daily as needed.  Advised to not take any other NSAIDs while taking either of these Continue heat Try stretching exercises She deferred referral to sports medicine/orthopedics and physical therapy at this time, but will let me know if her symptoms do not improve and I can refer

## 2017-07-24 NOTE — Patient Instructions (Signed)
Take the prednisone daily for 4 days.  While taking that do not take any advil / aleve / meloxicam while on this medication. Once you finish the medication you can use the meloxicam.   Take the muscle relaxer as prescribed.    Cervical Sprain A cervical sprain is a stretch or tear in one or more of the tough, cord-like tissues that connect bones (ligaments) in the neck. Cervical sprains can range from mild to severe. Severe cervical sprains can cause the spinal bones (vertebrae) in the neck to be unstable. This can lead to spinal cord damage and can result in serious nervous system problems. The amount of time that it takes for a cervical sprain to get better depends on the cause and extent of the injury. Most cervical sprains heal in 4-6 weeks. What are the causes? Cervical sprains may be caused by an injury (trauma), such as from a motor vehicle accident, a fall, or sudden forward and backward whipping movement of the head and neck (whiplash injury). Mild cervical sprains may be caused by wear and tear over time, such as from poor posture, sitting in a chair that does not provide support, or looking up or down for long periods of time. What increases the risk? The following factors may make you more likely to develop this condition:  Participating in activities that have a high risk of trauma to the neck. These include contact sports, auto racing, gymnastics, and diving.  Taking risks when driving or riding in a motor vehicle, such as speeding.  Having osteoarthritis of the spine.  Having poor strength and flexibility of the neck.  A previous neck injury.  Having poor posture.  Spending a lot of time in certain positions that put stress on the neck, such as sitting at a computer for long periods of time.  What are the signs or symptoms? Symptoms of this condition include:  Pain, soreness, stiffness, tenderness, swelling, or a burning sensation in the front, back, or sides of the  neck.  Sudden tightening of neck muscles that you cannot control (muscle spasms).  Pain in the shoulders or upper back.  Limited ability to move the neck.  Headache.  Dizziness.  Nausea.  Vomiting.  Weakness, numbness, or tingling in a hand or an arm.  Symptoms may develop right away after injury, or they may develop over a few days. In some cases, symptoms may go away with treatment and return (recur) over time. How is this diagnosed? This condition may be diagnosed based on:  Your medical history.  Your symptoms.  Any recent injuries or known neck problems that you have, such as arthritis in the neck.  A physical exam.  Imaging tests, such as: ? X-rays. ? MRI. ? CT scan.  How is this treated? This condition is treated by resting and icing the injured area and doing physical therapy exercises. Depending on the severity of your condition, treatment may also include:  Keeping your neck in place (immobilized) for periods of time. This may be done using: ? A cervical collar. This supports your chin and the back of your head. ? A cervical traction device. This is a sling that holds up your head. This removes weight and pressure from your neck, and it may help to relieve pain.  Medicines that help to relieve pain and inflammation.  Medicines that help to relax your muscles (muscle relaxants).  Surgery. This is rare.  Follow these instructions at home: If you have a cervical collar:  Wear it as told by your health care provider. Do not remove the collar unless instructed by your health care provider.  Ask your health care provider before you make any adjustments to your collar.  If you have long hair, keep it outside of the collar.  Ask your health care provider if you can remove the collar for cleaning and bathing. If you are allowed to remove the collar for cleaning or bathing: ? Follow instructions from your health care provider about how to remove the collar  safely. ? Clean the collar by wiping it with mild soap and water and drying it completely. ? If your collar has removable pads, remove them every 1-2 days and wash them by hand with soap and water. Let them air-dry completely before you put them back in the collar. ? Check your skin under the collar for irritation or sores. If you see any, tell your health care provider. Managing pain, stiffness, and swelling  If directed, use a cervical traction device as told by your health care provider.  If directed, apply heat to the affected area before you do your physical therapy or as often as told by your health care provider. Use the heat source that your health care provider recommends, such as a moist heat pack or a heating pad. ? Place a towel between your skin and the heat source. ? Leave the heat on for 20-30 minutes. ? Remove the heat if your skin turns bright red. This is especially important if you are unable to feel pain, heat, or cold. You may have a greater risk of getting burned.  If directed, put ice on the affected area: ? Put ice in a plastic bag. ? Place a towel between your skin and the bag. ? Leave the ice on for 20 minutes, 2-3 times a day. Activity  Do not drive while wearing a cervical collar. If you do not have a cervical collar, ask your health care provider if it is safe to drive while your neck heals.  Do not drive or use heavy machinery while taking prescription pain medicine or muscle relaxants, unless your health care provider approves.  Do not lift anything that is heavier than 10 lb (4.5 kg) until your health care provider tells you that it is safe.  Rest as directed by your health care provider. Avoid positions and activities that make your symptoms worse. Ask your health care provider what activities are safe for you.  If physical therapy was prescribed, do exercises as told by your health care provider or physical therapist. General instructions  Take  over-the-counter and prescription medicines only as told by your health care provider.  Do not use any products that contain nicotine or tobacco, such as cigarettes and e-cigarettes. These can delay healing. If you need help quitting, ask your health care provider.  Keep all follow-up visits as told by your health care provider or physical therapist. This is important. How is this prevented? To prevent a cervical sprain from happening again:  Use and maintain good posture. Make any needed adjustments to your workstation to help you use good posture.  Exercise regularly as directed by your health care provider or physical therapist.  Avoid risky activities that may cause a cervical sprain.  Contact a health care provider if:  You have symptoms that get worse or do not get better after 2 weeks of treatment.  You have pain that gets worse or does not get better with medicine.  You  develop new, unexplained symptoms.  You have sores or irritated skin on your neck from wearing your cervical collar. Get help right away if:  You have severe pain.  You develop numbness, tingling, or weakness in any part of your body.  You cannot move a part of your body (you have paralysis).  You have neck pain along with: ? Severe dizziness. ? Headache. Summary  A cervical sprain is a stretch or tear in one or more of the tough, cord-like tissues that connect bones (ligaments) in the neck.  Cervical sprains may be caused by an injury (trauma), such as from a motor vehicle accident, a fall, or sudden forward and backward whipping movement of the head and neck (whiplash injury).  Symptoms may develop right away after injury, or they may develop over a few days.  This condition is treated by resting and icing the injured area and doing physical therapy exercises. This information is not intended to replace advice given to you by your health care provider. Make sure you discuss any questions you have  with your health care provider. Document Released: 04/13/2007 Document Revised: 02/13/2016 Document Reviewed: 02/13/2016 Elsevier Interactive Patient Education  [employer].

## 2017-09-05 NOTE — Patient Instructions (Addendum)
  Test(s) ordered today. Your results will be released to [facility] (or called to you) after review, usually within 72hours after test completion. If any changes need to be made, you will be notified at that same time.  All other Health Maintenance issues reviewed.   All recommended immunizations and age-appropriate screenings are up-to-date or discussed.  No immunizations administered today.   Medications reviewed and updated.  No changes recommended at this time.  Your prescription(s) have been submitted to your pharmacy. Please take as directed and contact our office if you believe you are having problem(s) with the medication(s).  A referral was ordered for ENT and Gastroenterology.    Please followup in 6 months

## 2017-09-05 NOTE — Progress Notes (Signed)
Subjective:    Patient ID: Hannah [name], female    DOB: 06/24/1951, 67 y.o.   MRN: [id number]  HPI The patient is here for follow up.  Hypertension: She is taking her medication daily. She is compliant with a low sodium diet.  She denies chest pain, palpitations, edema, shortness of breath and regular headaches, but has had some headaches recently. She is exercising regularly.  She does not monitor her blood pressure at home.    Hyperlipidemia: She is taking her medication daily. She is compliant with a low fat/cholesterol diet. She is exercising regularly. She denies myalgias.   Prediabetes:  She is compliant with a low sugar/carbohydrate diet.  She is exercising regularly - gym 2/week.    Hormone replacement therapy:  She is taking estrace every other day.  She feel good on that dose.  She has not tried to decrease her dose further.    Anxiety: She is taking xanax  daily as prescribed. She denies any side effects from the medication. She feels her anxiety is well controlled and she is happy with her current dose of medication.   She wonders if she should have her vocal cord polyps rechecked.  She worries about if they have gotten worse.    Dysphagia:  She has had long term dysphagia.  She is not able to swallow pills.  She takes her GERD medication daily and denies GERD.   Medications and allergies reviewed with patient and updated if appropriate.  Patient Active Problem List   Diagnosis Date Noted  . Cervical strain 07/24/2017  . Prediabetes 03/09/2017  . Chest pain 12/29/2016  . GERD (gastroesophageal reflux disease) 02/27/2016  . Hormone replacement therapy (HRT) 02/27/2016  . Anxiety 02/27/2016  . Osteoarthritis of left knee 02/19/2014  . S/P total knee replacement 02/13/2014  . RBBB 02/08/2014  . HTN (hypertension) 02/08/2014  . PVD - bilateral 60-79% carotid strenosis 02/08/2014  . Mixed hyperlipidemia 11/29/2013  . Carpal tunnel syndrome, bilateral 11/23/2013  .  DJD (degenerative joint disease) 11/23/2013  . Murmur- mild -mod AR, mild MR 11/10/2013  . Chronic constipation 12/16/2012  . Arthritis 10/08/2012  . Nicotine abuse 12/29/2011    Current Outpatient Medications on File Prior to Visit  Medication Sig Dispense Refill  . ALPRAZolam (XANAX) 0.5 MG tablet Take 1 tablet (0.5 mg total) by mouth 2 (two) times daily as needed. for sleep 60 tablet 5  . aspirin 81 MG tablet Take 81 mg by mouth daily.    . bisoprolol-hydrochlorothiazide (ZIAC) 5-6.25 MG tablet Take 0.5 tablets by mouth daily. 45 tablet 2  . cholecalciferol (VITAMIN D) 1000 UNITS tablet Take 1 tablet (1,000 Units total) by mouth daily. 30 tablet 5  . estradiol (ESTRACE) 0.5 MG tablet Take 1 tablet (0.5 mg total) by mouth daily. 90 tablet 0  . fenofibrate 160 MG tablet Take 1 tablet (160 mg total) by mouth daily. 90 tablet 3  . fexofenadine (ALLEGRA) 180 MG tablet Take 180 mg by mouth daily.    . meloxicam (MOBIC) 15 MG tablet Take 1 tablet (15 mg total) by mouth daily. 30 tablet 0  . methocarbamol (ROBAXIN) 500 MG tablet Take 1 tablet (500 mg total) by mouth every 6 (six) hours as needed for muscle spasms. 40 tablet 0  . omeprazole (PRILOSEC) 40 MG capsule TAKE 1 CAPSULE (40 MG TOTAL) BY MOUTH 2 (TWO) TIMES DAILY BEFORE LUNCH AND SUPPER. 180 capsule 1  . pravastatin (PRAVACHOL) 20 MG tablet Take 1 tablet (  20 mg total) by mouth every evening. 90 tablet 2  . Probiotic Product (PROBIOTIC DAILY PO) Take 1 capsule by mouth daily.     No current facility-administered medications on file prior to visit.     Past Medical History:  Diagnosis Date  . Allergy   . Anxiety   . Arthritis   . Carpal tunnel syndrome   . Depression   . Dyslipidemia   . Heart murmur    mild-moderate AR  . Hyperlipidemia    on meds   . Hypertension   . PVD (peripheral vascular disease) (HCC)    moderate carotid disease  . RBBB   . Smoker     Past Surgical History:  Procedure Laterality Date  . ABDOMINAL  HYSTERECTOMY  1994  . COLONOSCOPY    . COLONOSCOPY W/ POLYPECTOMY  2009  . HEMORRHOID SURGERY    . SPINE SURGERY  08/16/2009  . TOTAL KNEE ARTHROPLASTY Left 02/13/2014   Procedure: TOTAL KNEE ARTHROPLASTY;  Surgeon: Alta Corning, MD;  Location: [facility];  Service: Orthopedics;  Laterality: Left;  . TUBAL LIGATION      Social History   Socioeconomic History  . Marital status: Divorced    Spouse name: None  . Number of children: None  . Years of education: None  . Highest education level: None  Social Needs  . Financial resource strain: None  . Food insecurity - worry: None  . Food insecurity - inability: None  . Transportation needs - medical: None  . Transportation needs - non-medical: None  Occupational History  . None  Tobacco Use  . Smoking status: Light Tobacco Smoker    Packs/day: 0.50    Years: 40.00    Pack years: 20.00    Types: Cigarettes  . Smokeless tobacco: Never Used  . Tobacco comment: PACK WILL LAST 4 DAYS  Substance and Sexual Activity  . Alcohol use: No  . Drug use: No  . Sexual activity: No  Other Topics Concern  . None  Social History Narrative   No regular exercise    Family History  Problem Relation Age of Onset  . Cancer Father   . Prostate cancer Father   . Diabetes Sister   . Stroke Maternal Grandfather   . Colon cancer Neg Hx   . Colon polyps Neg Hx   . Rectal cancer Neg Hx   . Stomach cancer Neg Hx   . Esophageal cancer Neg Hx     Review of Systems  Constitutional: Negative for chills and fever.  HENT: Positive for postnasal drip, trouble swallowing and voice change.   Respiratory: Negative for cough, shortness of breath and wheezing.   Cardiovascular: Positive for chest pain (last week - transient - 1 second). Negative for palpitations and leg swelling.  Gastrointestinal:       No gerd  Endocrine: Positive for cold intolerance.  Neurological: Positive for headaches (more in past three weeks). Negative for light-headedness.        Objective:   Vitals:   09/07/17 0845  BP: 120/82  Pulse: 65  Resp: 16  Temp: 98.4 F (36.9 C)  SpO2: 98%   BP Readings from Last 3 Encounters:  09/07/17 120/82  07/24/17 124/70  03/09/17 (!) 142/74   Wt Readings from Last 3 Encounters:  09/07/17 121 lb (54.9 kg)  07/24/17 121 lb (54.9 kg)  03/09/17 124 lb (56.2 kg)   Body mass index is 23.63 kg/m.   Physical Exam    Constitutional: Appears  well-developed and well-nourished. No distress.  HENT:  Head: Normocephalic and atraumatic.  Neck: Neck supple. No tracheal deviation present. No thyromegaly present.  No cervical lymphadenopathy Cardiovascular: Normal rate, regular rhythm and normal heart sounds.   No murmur heard. No carotid bruit .  No edema Pulmonary/Chest: Effort normal and breath sounds normal. No respiratory distress. No has no wheezes. No rales.  Skin: Skin is warm and dry. Not diaphoretic.  Psychiatric: Normal mood and affect. Behavior is normal.      Assessment & Plan:    See Problem List for Assessment and Plan of chronic medical problems.

## 2017-09-07 ENCOUNTER — Encounter: Payer: Self-pay | Admitting: Internal Medicine

## 2017-09-07 ENCOUNTER — Ambulatory Visit (INDEPENDENT_AMBULATORY_CARE_PROVIDER_SITE_OTHER): Payer: Medicare HMO | Admitting: Internal Medicine

## 2017-09-07 ENCOUNTER — Telehealth: Payer: Self-pay | Admitting: Cardiology

## 2017-09-07 ENCOUNTER — Other Ambulatory Visit (INDEPENDENT_AMBULATORY_CARE_PROVIDER_SITE_OTHER): Payer: Medicare HMO

## 2017-09-07 VITALS — BP 120/82 | HR 65 | Temp 98.4°F | Resp 16 | Wt 121.0 lb

## 2017-09-07 DIAGNOSIS — R7303 Prediabetes: Secondary | ICD-10-CM

## 2017-09-07 DIAGNOSIS — K219 Gastro-esophageal reflux disease without esophagitis: Secondary | ICD-10-CM

## 2017-09-07 DIAGNOSIS — Z79890 Hormone replacement therapy (postmenopausal): Secondary | ICD-10-CM | POA: Diagnosis not present

## 2017-09-07 DIAGNOSIS — I10 Essential (primary) hypertension: Secondary | ICD-10-CM

## 2017-09-07 DIAGNOSIS — R1310 Dysphagia, unspecified: Secondary | ICD-10-CM | POA: Diagnosis not present

## 2017-09-07 DIAGNOSIS — F419 Anxiety disorder, unspecified: Secondary | ICD-10-CM

## 2017-09-07 DIAGNOSIS — J381 Polyp of vocal cord and larynx: Secondary | ICD-10-CM | POA: Diagnosis not present

## 2017-09-07 DIAGNOSIS — E782 Mixed hyperlipidemia: Secondary | ICD-10-CM

## 2017-09-07 LAB — CBC WITH DIFFERENTIAL/PLATELET
Basophils Absolute: 0.1 10*3/uL (ref 0.0–0.1)
Basophils Relative: 0.9 % (ref 0.0–3.0)
Eosinophils Absolute: 0.2 10*3/uL (ref 0.0–0.7)
Eosinophils Relative: 4 % (ref 0.0–5.0)
HCT: 35 % — ABNORMAL LOW (ref 36.0–46.0)
Hemoglobin: 11.5 g/dL — ABNORMAL LOW (ref 12.0–15.0)
Lymphocytes Relative: 44.2 % (ref 12.0–46.0)
Lymphs Abs: 2.5 10*3/uL (ref 0.7–4.0)
MCHC: 32.8 g/dL (ref 30.0–36.0)
MCV: 83.1 fl (ref 78.0–100.0)
Monocytes Absolute: 0.5 10*3/uL (ref 0.1–1.0)
Monocytes Relative: 8.5 % (ref 3.0–12.0)
Neutro Abs: 2.4 10*3/uL (ref 1.4–7.7)
Neutrophils Relative %: 42.4 % — ABNORMAL LOW (ref 43.0–77.0)
Platelets: 257 10*3/uL (ref 150.0–400.0)
RBC: 4.22 Mil/uL (ref 3.87–5.11)
RDW: 13 % (ref 11.5–15.5)
WBC: 5.6 10*3/uL (ref 4.0–10.5)

## 2017-09-07 LAB — HEMOGLOBIN A1C: Hgb A1c MFr Bld: 6.3 % (ref 4.6–6.5)

## 2017-09-07 LAB — COMPREHENSIVE METABOLIC PANEL
ALT: 11 U/L (ref 0–35)
AST: 19 U/L (ref 0–37)
Albumin: 4.1 g/dL (ref 3.5–5.2)
Alkaline Phosphatase: 33 U/L — ABNORMAL LOW (ref 39–117)
BUN: 8 mg/dL (ref 6–23)
CO2: 30 mEq/L (ref 19–32)
Calcium: 10.2 mg/dL (ref 8.4–10.5)
Chloride: 106 mEq/L (ref 96–112)
Creatinine, Ser: 0.85 mg/dL (ref 0.40–1.20)
GFR: 85.26 mL/min (ref >60.00)
Glucose, Bld: 97 mg/dL (ref 70–99)
Potassium: 3.8 mEq/L (ref 3.5–5.1)
Sodium: 143 mEq/L (ref 135–145)
Total Bilirubin: 0.4 mg/dL (ref 0.2–1.2)
Total Protein: 6.6 g/dL (ref 6.0–8.3)

## 2017-09-07 LAB — LIPID PANEL
Cholesterol: 127 mg/dL (ref 0–200)
HDL: 48.9 mg/dL (ref >39.00)
LDL Cholesterol: 56 mg/dL (ref 0–99)
NonHDL: 77.71
Total CHOL/HDL Ratio: 3
Triglycerides: 110 mg/dL (ref 0.0–149.0)
VLDL: 22 mg/dL (ref 0.0–40.0)

## 2017-09-07 MED ORDER — ALPRAZOLAM 0.5 MG PO TABS: 0.5000 mg | ORAL_TABLET | Freq: Two times a day (BID) | ORAL | 5 refills | Status: DC | PRN

## 2017-09-07 MED ORDER — ESTRADIOL 0.5 MG PO TABS: 0.2500 mg | ORAL_TABLET | ORAL | 1 refills | Status: DC

## 2017-09-07 NOTE — Assessment & Plan Note (Signed)
Controlled, stable Continue current dose of medication  

## 2017-09-07 NOTE — Assessment & Plan Note (Signed)
Check a1c Low sugar / carb diet Stressed regular exercise   

## 2017-09-07 NOTE — Telephone Encounter (Signed)
Closed Encounter  °

## 2017-09-07 NOTE — Assessment & Plan Note (Signed)
BP well controlled Current regimen effective and well tolerated Continue current medications at current doses cmp  

## 2017-09-07 NOTE — Assessment & Plan Note (Signed)
Taking estrace 0.5 mg every other day - advised to slowly decrease dose to 1/2 pill and eventually taper off medication Discussed risks of being on medication - in particular blood clots

## 2017-09-07 NOTE — Assessment & Plan Note (Signed)
GERD controlled - no symptoms Continue daily medication

## 2017-09-07 NOTE — Assessment & Plan Note (Signed)
She is concerned they have gotten worse Follow up with ENT - referral ordered

## 2017-09-07 NOTE — Assessment & Plan Note (Signed)
Long term dysphagia Had swallowing eval at one point that was normal Taking GERD medication - denies GERD ? Silent GERD or esophageal dysfunction Will refer to GI

## 2017-09-07 NOTE — Assessment & Plan Note (Signed)
Check lipid panel  Continue daily statin Regular exercise and healthy diet encouraged  

## 2017-09-14 ENCOUNTER — Ambulatory Visit: Payer: Medicare HMO | Admitting: Cardiology

## 2017-09-16 ENCOUNTER — Encounter: Payer: Self-pay | Admitting: Cardiology

## 2017-09-22 DIAGNOSIS — F17210 Nicotine dependence, cigarettes, uncomplicated: Secondary | ICD-10-CM | POA: Diagnosis not present

## 2017-09-22 DIAGNOSIS — M5080 Other cervical disc disorders, unspecified cervical region: Secondary | ICD-10-CM | POA: Diagnosis not present

## 2017-09-22 DIAGNOSIS — K219 Gastro-esophageal reflux disease without esophagitis: Secondary | ICD-10-CM | POA: Diagnosis not present

## 2017-09-22 DIAGNOSIS — J381 Polyp of vocal cord and larynx: Secondary | ICD-10-CM | POA: Diagnosis not present

## 2017-09-23 NOTE — Progress Notes (Signed)
HPI The patient presents for follow up of PVD and a murmur.  She had mild sclerosis on her aortic valve with mild aortic  nsufficiency. She did have carotid artery disease as described below.  Since I last saw her she was in the ED last year for chest pain that was felt to be chest wall.    Since I last saw her she is done well.  She walks to the [facility] and does other exercises. The patient denies any new symptoms such as chest discomfort, neck or arm discomfort. There has been no new shortness of breath, PND or orthopnea. There have been no reported palpitations, presyncope or syncope.  She is still smoking.    Allergies  Allergen Reactions  . Amlodipine     Rash, swelling  . Chantix [Varenicline Tartrate] Other (See Comments)    Tongue swell,sob  . Clarithromycin Rash  . Lisinopril Hives  . Simvastatin Hives  . Wellbutrin [Bupropion Hcl] Hives  . Lipitor [Atorvastatin]     Dizziness per patient    Current Outpatient Medications  Medication Sig Dispense Refill  . ALPRAZolam (XANAX) 0.5 MG tablet Take 1 tablet (0.5 mg total) by mouth 2 (two) times daily as needed. for sleep 60 tablet 5  . aspirin 81 MG tablet Take 81 mg by mouth daily.    . bisoprolol-hydrochlorothiazide (ZIAC) 5-6.25 MG tablet Take 0.5 tablets by mouth daily. 45 tablet 2  . cholecalciferol (VITAMIN D) 1000 UNITS tablet Take 1 tablet (1,000 Units total) by mouth daily. 30 tablet 5  . estradiol (ESTRACE) 0.5 MG tablet Take 0.5 tablets (0.25 mg total) by mouth every other day. 45 tablet 1  . fenofibrate 160 MG tablet Take 1 tablet (160 mg total) by mouth daily. 90 tablet 3  . fexofenadine (ALLEGRA) 180 MG tablet Take 180 mg by mouth daily.    . meloxicam (MOBIC) 15 MG tablet Take 1 tablet (15 mg total) by mouth daily. 30 tablet 0  . methocarbamol (ROBAXIN) 500 MG tablet Take 1 tablet (500 mg total) by mouth every 6 (six) hours as needed for muscle spasms. 40 tablet 0  . omeprazole (PRILOSEC) 40 MG capsule TAKE 1  CAPSULE (40 MG TOTAL) BY MOUTH 2 (TWO) TIMES DAILY BEFORE LUNCH AND SUPPER. 180 capsule 1  . pravastatin (PRAVACHOL) 20 MG tablet Take 1 tablet (20 mg total) by mouth every evening. 90 tablet 2  . Probiotic Product (PROBIOTIC DAILY PO) Take 1 capsule by mouth daily.     No current facility-administered medications for this visit.     Past Medical History:  Diagnosis Date  . Allergy   . Anxiety   . Arthritis   . Carpal tunnel syndrome   . Depression   . Dyslipidemia   . Heart murmur    mild-moderate AR  . Hyperlipidemia    on meds   . Hypertension   . PVD (peripheral vascular disease) (HCC)    moderate carotid disease  . RBBB   . Smoker     Past Surgical History:  Procedure Laterality Date  . ABDOMINAL HYSTERECTOMY  1994  . COLONOSCOPY    . COLONOSCOPY W/ POLYPECTOMY  2009  . HEMORRHOID SURGERY    . SPINE SURGERY  08/16/2009  . TOTAL KNEE ARTHROPLASTY Left 02/13/2014   Procedure: TOTAL KNEE ARTHROPLASTY;  Surgeon: Alta Corning, MD;  Location: [facility];  Service: Orthopedics;  Laterality: Left;  . TUBAL LIGATION       ROS:  As stated  in the HPI and negative for all other systems.  PHYSICAL EXAM BP (!) 168/62   Pulse (!) 49   Ht 5' (1.524 m)   Wt 120 lb 9.6 oz (54.7 kg)   BMI 23.55 kg/m   GENERAL:  Well appearing NECK:  No jugular venous distention, waveform within normal limits, carotid upstroke brisk and symmetric, bilateral soft bruits, no thyromegaly LUNGS:  Clear to auscultation bilaterally CHEST:  Unremarkable HEART:  PMI not displaced or sustained,S1 and S2 within normal limits, no S3, no S4, no clicks, no rubs, soft apical systolic murmur early peaking nonradiating, no diastolic murmurs ABD:  Flat, positive bowel sounds normal in frequency in pitch, no bruits, no rebound, no guarding, no midline pulsatile mass, no hepatomegaly, no splenomegaly EXT:  2 plus pulses throughout, no edema, no cyanosis no clubbing   EKG:  Sinus rhythm, rate 49, right bundle branch  block, left anterior fascicular block, no acute ST-T wave changes.       Lab Results  Component Value Date   CHOL 127 09/07/2017   TRIG 110.0 09/07/2017   HDL 48.90 09/07/2017   LDLCALC 56 09/07/2017   LDLDIRECT 87.0 11/09/2014     ASSESSMENT AND PLAN  MURMUR:  This is mild aortic sclerosis and AI on echo in 2015.  There has been no change in her exam.  She has no symptoms.  No further imaging is indicated.  RBBB/LAFB:    We did discuss the fact that if she has any lightheadedness or presyncope or syncope that I would need to see her back to evaluate this.    CAROTID STENOSIS:   She has 40 - 59% right carotid stenosis and 60 - 79% left stenosis.  This will be followed in Sept.    DYSLIPIDEMIA:    She is at target.  No change in therapy.   TOBACCO:    I gave her the number for 1 normal pain  800 QUIT NOW  BRADYCARDIA:  She tolerates this.  See above.   HYPERTENSION:  Her BP is elevated today but this is isolated .  She will keep a home check.

## 2017-09-24 ENCOUNTER — Encounter: Payer: Self-pay | Admitting: Cardiology

## 2017-09-24 ENCOUNTER — Ambulatory Visit: Payer: Medicare HMO | Admitting: Cardiology

## 2017-09-24 VITALS — BP 168/62 | HR 49 | Ht 60.0 in | Wt 120.6 lb

## 2017-09-24 DIAGNOSIS — Z720 Tobacco use: Secondary | ICD-10-CM | POA: Diagnosis not present

## 2017-09-24 DIAGNOSIS — I6523 Occlusion and stenosis of bilateral carotid arteries: Secondary | ICD-10-CM | POA: Diagnosis not present

## 2017-09-24 DIAGNOSIS — I10 Essential (primary) hypertension: Secondary | ICD-10-CM | POA: Diagnosis not present

## 2017-09-24 DIAGNOSIS — I358 Other nonrheumatic aortic valve disorders: Secondary | ICD-10-CM | POA: Diagnosis not present

## 2017-09-24 DIAGNOSIS — E785 Hyperlipidemia, unspecified: Secondary | ICD-10-CM | POA: Insufficient documentation

## 2017-09-24 NOTE — Patient Instructions (Signed)
Medication Instructions:  Continue current medications  If you need a refill on your cardiac medications before your next appointment, please call your pharmacy.  Labwork: None Ordered   Testing/Procedures: None Ordered  Special Instructions: 1-800-QUIT-NOW  Follow-Up: Your physician wants you to follow-up in: 1 Year. You should receive a reminder letter in the mail two months in advance. If you do not receive a letter, please call our office [phone].     Thank you for choosing [facility] at [facility]!!

## 2017-09-24 NOTE — Addendum Note (Signed)
Addended by: Zebedee Iba on: 09/24/2017 12:17 PM   Modules accepted: Orders

## 2017-10-19 ENCOUNTER — Other Ambulatory Visit: Payer: Self-pay | Admitting: Internal Medicine

## 2017-10-26 ENCOUNTER — Encounter: Payer: Self-pay | Admitting: *Deleted

## 2017-12-01 ENCOUNTER — Ambulatory Visit (INDEPENDENT_AMBULATORY_CARE_PROVIDER_SITE_OTHER): Payer: Medicare HMO | Admitting: Internal Medicine

## 2017-12-01 ENCOUNTER — Encounter

## 2017-12-01 ENCOUNTER — Encounter: Payer: Self-pay | Admitting: Internal Medicine

## 2017-12-01 ENCOUNTER — Encounter (INDEPENDENT_AMBULATORY_CARE_PROVIDER_SITE_OTHER): Payer: Self-pay

## 2017-12-01 VITALS — BP 140/68 | HR 60 | Ht 60.5 in | Wt 119.1 lb

## 2017-12-01 DIAGNOSIS — K219 Gastro-esophageal reflux disease without esophagitis: Secondary | ICD-10-CM | POA: Diagnosis not present

## 2017-12-01 DIAGNOSIS — K5909 Other constipation: Secondary | ICD-10-CM

## 2017-12-01 DIAGNOSIS — R1310 Dysphagia, unspecified: Secondary | ICD-10-CM | POA: Diagnosis not present

## 2017-12-01 DIAGNOSIS — Z86010 Personal history of colonic polyps: Secondary | ICD-10-CM | POA: Diagnosis not present

## 2017-12-01 DIAGNOSIS — R1319 Other dysphagia: Secondary | ICD-10-CM

## 2017-12-01 MED ORDER — LINACLOTIDE 290 MCG PO CAPS: 290.0000 ug | ORAL_CAPSULE | Freq: Every day | ORAL | 3 refills | Status: DC

## 2017-12-01 NOTE — Progress Notes (Signed)
Patient ID: Hannah [name], female   DOB: 1951-03-03, 67 y.o.   MRN: [id number] HPI: Hannah [name] is a 67 year old female with a past medical history of adenomatous colon polyps, internal hemorrhoids, GERD, tobacco use, vocal cord polyps, hypertension, hyperlipidemia who is seen in consult at the request of Dr. Quay [name] to discuss dysphagia.  She is here alone today.    she is known to me from screening colonoscopy which I performed on 04/24/2016.  This colonoscopy revealed 8 polyps ranging in size from 3 to 10 mm which were removed, diverticulosis in the descending colon and hemorrhoids.  These polyps were found to be adenomatous with one being hyperplastic.  She reports that she has a history of indigestion and has been on omeprazole 40 mg daily for 4 to 5 years.  This controls her indigestion and she denies heartburn.  She does have issues with cough and also production of phlegm.  She wonders if this could relate to reflux.  She is been having issues with "choking" with swallowing pills and solid food.  No trouble with liquid.  Food at times feels as if it will not go down and she has to bring this back up.  Can happen with pills.  Been going on for more than a year.  Foods such as roast beef are particularly difficult for her to eat and so she avoids it.  No nausea or vomiting.  She does have long-standing constipation and is taking 14 vegetable laxatives or senna daily to produce a bowel movement.  She also takes 1 Gas-X daily.  She does have bloating which is worse after eating.  She states that she is "never felt as good" as when she was prepped for her colonoscopy.  She has slowly increase the number of senna that she is taking daily to produce a bowel movement.  No blood in her stool or melena.  Constipation is long-standing for her.  She was seen in March by Dr. Erik [name].  She had direct laryngoscopy and her vocal cord polyps were stable.  He recommended continued reflux management and smoking  cessation.  He also suggested physical therapy or chiropractor for her neck symptoms.  Past Medical History:  Diagnosis Date  . Allergy   . Anxiety   . Arthritis   . Carpal tunnel syndrome   . Depression   . Diverticulosis   . Dyslipidemia   . External hemorrhoids   . GERD (gastroesophageal reflux disease)   . Heart murmur    mild-moderate AR  . Hyperlipidemia    on meds   . Hypertension   . Internal hemorrhoids   . Osteoarthritis   . PVD (peripheral vascular disease) (HCC)    moderate carotid disease  . RBBB   . Smoker   . Tubular adenoma of colon   . Vocal cord polyps     Past Surgical History:  Procedure Laterality Date  . ABDOMINAL HYSTERECTOMY  1994  . COLONOSCOPY    . COLONOSCOPY W/ POLYPECTOMY  2009  . HEMORRHOID SURGERY    . SPINE SURGERY  08/16/2009  . TOTAL KNEE ARTHROPLASTY Left 02/13/2014   Procedure: TOTAL KNEE ARTHROPLASTY;  Surgeon: Alta Corning, MD;  Location: [facility];  Service: Orthopedics;  Laterality: Left;  . TUBAL LIGATION      Outpatient Medications Prior to Visit  Medication Sig Dispense Refill  . ALPRAZolam (XANAX) 0.5 MG tablet Take 1 tablet (0.5 mg total) by mouth 2 (two) times daily as needed. for sleep 60  tablet 5  . aspirin 81 MG tablet Take 81 mg by mouth daily.    . bisoprolol-hydrochlorothiazide (ZIAC) 5-6.25 MG tablet Take 0.5 tablets by mouth daily. 45 tablet 2  . cholecalciferol (VITAMIN D) 1000 UNITS tablet Take 1 tablet (1,000 Units total) by mouth daily. 30 tablet 5  . estradiol (ESTRACE) 0.5 MG tablet TAKE 1 TABLET EVERY DAY 90 tablet 1  . fenofibrate 160 MG tablet Take 1 tablet (160 mg total) by mouth daily. 90 tablet 3  . fexofenadine (ALLEGRA) 180 MG tablet Take 180 mg by mouth daily.    . meloxicam (MOBIC) 15 MG tablet Take 1 tablet (15 mg total) by mouth daily. 30 tablet 0  . methocarbamol (ROBAXIN) 500 MG tablet Take 1 tablet (500 mg total) by mouth every 6 (six) hours as needed for muscle spasms. 40 tablet 0  . omeprazole  (PRILOSEC) 40 MG capsule TAKE 1 CAPSULE 2 TIMES DAILY BEFORE LUNCH AND SUPPER. 180 capsule 1  . pravastatin (PRAVACHOL) 20 MG tablet Take 1 tablet (20 mg total) by mouth every evening. 90 tablet 2  . Probiotic Product (PROBIOTIC DAILY PO) Take 1 capsule by mouth daily.     No facility-administered medications prior to visit.     Allergies  Allergen Reactions  . Amlodipine     Rash, swelling  . Chantix [Varenicline Tartrate] Other (See Comments)    Tongue swell,sob  . Clarithromycin Rash  . Lisinopril Hives  . Simvastatin Hives  . Wellbutrin [Bupropion Hcl] Hives  . Lipitor [Atorvastatin]     Dizziness per patient    Family History  Problem Relation Age of Onset  . Cancer Father   . Prostate cancer Father   . Diabetes Sister   . Stroke Maternal Grandfather   . Colon cancer Neg Hx   . Colon polyps Neg Hx   . Rectal cancer Neg Hx   . Stomach cancer Neg Hx   . Esophageal cancer Neg Hx     Social History   Tobacco Use  . Smoking status: Light Tobacco Smoker    Packs/day: 0.50    Years: 40.00    Pack years: 20.00    Types: Cigarettes  . Smokeless tobacco: Never Used  . Tobacco comment: PACK WILL LAST 4 DAYS  Substance Use Topics  . Alcohol use: No  . Drug use: No    ROS: As per history of present illness, otherwise negative  BP 140/68 (BP Location: Left Arm, Patient Position: Sitting, Cuff Size: Normal)   Pulse 60   Ht 5' 0.5" (1.537 m) Comment: height measured without shoes  Wt 119 lb 2 oz (54 kg)   BMI 22.88 kg/m  Constitutional: Well-developed and well-nourished. No distress. HEENT: Normocephalic and atraumatic. Conjunctivae are normal.  No scleral icterus. Neck: Neck supple. Trachea midline. Cardiovascular: Normal rate, regular rhythm and intact distal pulses. No M/R/G Pulmonary/chest: Effort normal and breath sounds normal. No wheezing, rales or rhonchi. Abdominal: Soft, nontender, nondistended. Bowel sounds active throughout.  Extremities: no clubbing,  cyanosis, or edema Neurological: Alert and oriented to person place and time. Skin: Skin is warm and dry.  Psychiatric: Normal mood and affect. Behavior is normal.  RELEVANT LABS AND IMAGING: CBC    Component Value Date/Time   WBC 5.6 09/07/2017 0922   RBC 4.22 09/07/2017 0922   HGB 11.5 (L) 09/07/2017 0922   HCT 35.0 (L) 09/07/2017 0922   PLT 257.0 09/07/2017 0922   MCV 83.1 09/07/2017 0922   MCV 80.8 09/16/2015 1138  MCH 27.0 12/23/2016 1959   MCHC 32.8 09/07/2017 0922   RDW 13.0 09/07/2017 0922   LYMPHSABS 2.5 09/07/2017 0922   MONOABS 0.5 09/07/2017 0922   EOSABS 0.2 09/07/2017 0922   BASOSABS 0.1 09/07/2017 0922    CMP     Component Value Date/Time   NA 143 09/07/2017 0922   K 3.8 09/07/2017 0922   CL 106 09/07/2017 0922   CO2 30 09/07/2017 0922   GLUCOSE 97 09/07/2017 0922   BUN 8 09/07/2017 0922   CREATININE 0.85 09/07/2017 0922   CREATININE 0.91 09/16/2015 1135   CALCIUM 10.2 09/07/2017 0922   PROT 6.6 09/07/2017 0922   ALBUMIN 4.1 09/07/2017 0922   AST 19 09/07/2017 0922   ALT 11 09/07/2017 0922   ALKPHOS 33 (L) 09/07/2017 0922   BILITOT 0.4 09/07/2017 0922   GFRNONAA >60 12/23/2016 1959   GFRNONAA 65 09/16/2015 1135   GFRAA >60 12/23/2016 1959   GFRAA 76 09/16/2015 1135    ASSESSMENT/PLAN: 67 year old female with a past medical history of adenomatous colon polyps, internal hemorrhoids, GERD, tobacco use, vocal cord polyps, hypertension, hyperlipidemia who is seen in consult at the request of Dr. Quay [name] to discuss dysphagia.  1.  Esophageal dysphagia/history of GERD --endoscopy recommended with possible dilation to evaluate solid food dysphagia.  We discussed the risk, benefits and alternatives and she is agreeable and wishes to proceed.  Continue omeprazole 40 mg daily for now.  2.  Chronic constipation --using very high-dose senna to produce bowel movement.  I recommended she discontinue senna and try Linzess 290 mcg daily.  Notify me if ineffective or  if causing diarrhea.  She can continue Gas-X per box instruction if needed for bloating.  3.  History of adenomatous colon polyps --surveillance colonoscopy is recommended at the 3-year interval which for her would be October 2020   DT:OIZTI, Claudina Lick, Md [address] [place], [place] [address]

## 2017-12-01 NOTE — Patient Instructions (Signed)
You have been scheduled for an endoscopy. Please follow written instructions given to you at your visit today. If you use inhalers (even only as needed), please bring them with you on the day of your procedure. Your physician has requested that you go to www.startemmi.com and enter the access code given to you at your visit today. This web site gives a general overview about your procedure. However, you should still follow specific instructions given to you by our office regarding your preparation for the procedure.  Please discontinue your 14 senna you are taking daily, instead, we will try you on Linzess 290 mcg daily.  Please purchase the following medications over the counter and take as directed: Gas X as needed  We have sent the following medications to your pharmacy for you to pick up at your convenience: Linzess 290 mcg daily  If you are age 42 or older, your body mass index should be between 23-30. Your Body mass index is 22.88 kg/m. If this is out of the aforementioned range listed, please consider follow up with your Primary Care Provider.  If you are age 48 or younger, your body mass index should be between 19-25. Your Body mass index is 22.88 kg/m. If this is out of the aformentioned range listed, please consider follow up with your Primary Care Provider.

## 2017-12-21 ENCOUNTER — Ambulatory Visit (INDEPENDENT_AMBULATORY_CARE_PROVIDER_SITE_OTHER): Payer: Medicare HMO | Admitting: *Deleted

## 2017-12-21 VITALS — BP 122/66 | HR 52 | Resp 18 | Ht 61.0 in | Wt 121.0 lb

## 2017-12-21 DIAGNOSIS — Z0000 Encounter for general adult medical examination without abnormal findings: Secondary | ICD-10-CM

## 2017-12-21 NOTE — Progress Notes (Addendum)
Subjective:   Hannah [name] is a 67 y.o. female who presents for an Initial Medicare Annual Wellness Visit.  Review of Systems    No ROS.  Medicare Wellness Visit. Additional risk factors are reflected in the social history.   Cardiac Risk Factors include: advanced age (>94men, >30 women);dyslipidemia;hypertension;smoking/ tobacco exposure Sleep patterns: gets up 1 times nightly to void and sleeps 6 hours nightly.    Home Safety/Smoke Alarms: Feels safe in home. Smoke alarms in place.  Living environment; residence and Firearm Safety: 1-story house/ trailer, no firearms., Lives with daughter, no needs for DME, good support system Seat Belt Safety/Bike Helmet: Wears seat belt.      Objective:    Today's Vitals   12/21/17 0823  BP: 122/66  Pulse: (!) 52  Resp: 18  SpO2: 98%  Weight: 121 lb (54.9 kg)  Height: 5\' 1"  (1.549 m)   Body mass index is 22.86 kg/m.  Advanced Directives 12/21/2017 12/23/2016 04/24/2016 04/10/2016 02/13/2014 02/08/2014  Does Patient Have a Medical Advance Directive? No No No No No Patient would not like information  Does patient want to make changes to medical advance directive? Yes (ED - Information included in AVS) - - - - -    Current Medications (verified) Outpatient Encounter Medications as of 12/21/2017  Medication Sig  . ALPRAZolam (XANAX) 0.5 MG tablet Take 1 tablet (0.5 mg total) by mouth 2 (two) times daily as needed. for sleep  . Ascorbic Acid (VITAMIN C PO) Take 1 tablet by mouth daily.  Marland Kitchen aspirin 81 MG tablet Take 81 mg by mouth daily.  . bisoprolol-hydrochlorothiazide (ZIAC) 5-6.25 MG tablet Take 0.5 tablets by mouth daily.  . Calcium Carbonate (CALCIUM 600 PO) Take 1,200 mg by mouth daily.  . cholecalciferol (VITAMIN D) 1000 UNITS tablet Take 1 tablet (1,000 Units total) by mouth daily.  Marland Kitchen estradiol (ESTRACE) 0.5 MG tablet TAKE 1 TABLET EVERY DAY  . fenofibrate 160 MG tablet Take 1 tablet (160 mg total) by mouth daily.  . fexofenadine  (ALLEGRA) 180 MG tablet Take 180 mg by mouth daily.  Marland Kitchen linaclotide (LINZESS) 290 MCG CAPS capsule Take 1 capsule (290 mcg total) by mouth daily before breakfast.  . meloxicam (MOBIC) 15 MG tablet Take 1 tablet (15 mg total) by mouth daily.  . methocarbamol (ROBAXIN) 500 MG tablet Take 1 tablet (500 mg total) by mouth every 6 (six) hours as needed for muscle spasms.  Marland Kitchen omeprazole (PRILOSEC) 40 MG capsule TAKE 1 CAPSULE 2 TIMES DAILY BEFORE LUNCH AND SUPPER.  . pravastatin (PRAVACHOL) 20 MG tablet Take 1 tablet (20 mg total) by mouth every evening.  . Probiotic Product (PROBIOTIC DAILY PO) Take 1 capsule by mouth daily.   No facility-administered encounter medications on file as of 12/21/2017.     Allergies (verified) Amlodipine; Chantix [varenicline tartrate]; Clarithromycin; Lisinopril; Simvastatin; Wellbutrin [bupropion hcl]; and Lipitor [atorvastatin]   History: Past Medical History:  Diagnosis Date  . Allergy   . Anxiety   . Arthritis   . Carpal tunnel syndrome   . Depression   . Diverticulosis   . Dyslipidemia   . External hemorrhoids   . GERD (gastroesophageal reflux disease)   . Heart murmur    mild-moderate AR  . Hyperlipidemia    on meds   . Hypertension   . Internal hemorrhoids   . Osteoarthritis   . PVD (peripheral vascular disease) (HCC)    moderate carotid disease  . RBBB   . Smoker   . Tubular  adenoma of colon   . Vocal cord polyps    Past Surgical History:  Procedure Laterality Date  . ABDOMINAL HYSTERECTOMY  1994  . COLONOSCOPY    . COLONOSCOPY W/ POLYPECTOMY  2009  . HEMORRHOID SURGERY    . SPINE SURGERY  08/16/2009  . TOTAL KNEE ARTHROPLASTY Left 02/13/2014   Procedure: TOTAL KNEE ARTHROPLASTY;  Surgeon: Alta Corning, MD;  Location: [facility];  Service: Orthopedics;  Laterality: Left;  . TUBAL LIGATION     Family History  Problem Relation Age of Onset  . Cancer Father   . Prostate cancer Father   . Diabetes Sister   . Stroke Maternal Grandfather   .  Colon cancer Neg Hx   . Colon polyps Neg Hx   . Rectal cancer Neg Hx   . Stomach cancer Neg Hx   . Esophageal cancer Neg Hx    Social History   Socioeconomic History  . Marital status: Divorced    Spouse name: Not on file  . Number of children: 2  . Years of education: Not on file  . Highest education level: Not on file  Occupational History  . Not on file  Social Needs  . Financial resource strain: Not hard at all  . Food insecurity:    Worry: Never true    Inability: Never true  . Transportation needs:    Medical: No    Non-medical: No  Tobacco Use  . Smoking status: Light Tobacco Smoker    Packs/day: 0.50    Years: 40.00    Pack years: 20.00    Types: Cigarettes  . Smokeless tobacco: Never Used  . Tobacco comment: PACK WILL LAST 4 DAYS  Substance and Sexual Activity  . Alcohol use: No  . Drug use: No  . Sexual activity: Never  Lifestyle  . Physical activity:    Days per week: 2 days    Minutes per session: 60 min  . Stress: To some extent  Relationships  . Social connections:    Talks on phone: More than three times a week    Gets together: More than three times a week    Attends religious service: More than 4 times per year    Active member of club or organization: Yes    Attends meetings of clubs or organizations: More than 4 times per year    Relationship status: Married  Other Topics Concern  . Not on file  Social History Narrative  . Not on file    Tobacco Counseling Ready to quit: Not Answered Counseling given: Not Answered Comment: PACK WILL LAST 4 DAYS  Activities of Daily Living In your present state of health, do you have any difficulty performing the following activities: 12/21/2017  Hearing? N  Vision? N  Difficulty concentrating or making decisions? N  Walking or climbing stairs? N  Dressing or bathing? N  Doing errands, shopping? N  Preparing Food and eating ? N  Using the Toilet? N  In the past six months, have you accidently  leaked urine? N  Do you have problems with loss of bowel control? N  Managing your Medications? N  Managing your Finances? Y  Housekeeping or managing your Housekeeping? N  Some recent data might be hidden     Immunizations and Health Maintenance Immunization History  Administered Date(s) Administered  . Pneumococcal Conjugate-13 05/30/2014  . Pneumococcal Polysaccharide-23 02/27/2016  . Td 08/01/2004  . Tdap 05/30/2014   There are no preventive care reminders to  display for this patient.  Patient Care Team: [name], MD as PCP - General (Internal Medicine)  Indicate any recent Medical Services you may have received from other than [facility] providers in the past year (date may be approximate).     Assessment:   This is a routine wellness examination for Braden. Physical assessment deferred to PCP.   Hearing/Vision screen Hearing Screening Comments: Able to hear conversational tones w/o difficulty. No issues reported.  Passed whisper test Vision Screening Comments: appointment yearly Dr. Gershon Crane  Dietary issues and exercise activities discussed: Current Exercise Habits: Structured exercise class, Type of exercise: walking, Time (Minutes): 20, Frequency (Times/Week): 2, Weekly Exercise (Minutes/Week): 40, Intensity: Mild, Exercise limited by: None identified  Diet (meal preparation, eat out, water intake, caffeinated beverages, dairy products, fruits and vegetables): in general, a "healthy" diet  , well balanced, eats a variety of fruits and vegetables daily, limits salt, fat/cholesterol, sugar,carbohydrates,caffeine, drinks 6-8 glasses of water daily.  Goals    . LDL CALC < 100     LDL goal < 100 preferably.  Non-HDL goal < 130    . Patient Stated     I want to eat healthy to control cholesterol and prevent diabetes.      Depression Screen PHQ 2/9 Scores 12/21/2017 03/09/2017 01/05/2016 09/16/2015 12/05/2014 05/30/2014 05/30/2014  PHQ - 2 Score 0 2 0 0 1 0 0  PHQ- 9 Score 0  - - - - - -    Fall Risk Fall Risk  12/21/2017 03/09/2017 01/05/2016 12/05/2014 05/30/2014  Falls in the past year? No No No No Yes  Comment - - - - left knee had given out    Cognitive Function: MMSE - Mini Mental State Exam 12/21/2017  Orientation to time 5  Orientation to Place 5  Registration 3  Attention/ Calculation 5  Recall 3  Language- name 2 objects 2  Language- repeat 1  Language- follow 3 step command 3  Language- read & follow direction 1  Write a sentence 1  Copy design 1  Total score 30        Screening Tests Health Maintenance  Topic Date Due  . INFLUENZA VACCINE  01/28/2018  . MAMMOGRAM  01/22/2019  . COLONOSCOPY  04/25/2019  . DEXA SCAN  09/02/2021  . TETANUS/TDAP  05/30/2024  . Hepatitis C Screening  Completed  . PNA vac Low Risk Adult  Completed      Plan:     I have personally reviewed and noted the following in the patient's chart:   . Medical and social history . Use of alcohol, tobacco or illicit drugs  . Current medications and supplements . Functional ability and status . Nutritional status . Physical activity . Advanced directives . List of other physicians . Vitals . Screenings to include cognitive, depression, and falls . Referrals and appointments  In addition, I have reviewed and discussed with patient certain preventive protocols, quality metrics, and best practice recommendations. A written personalized care plan for preventive services as well as general preventive health recommendations were provided to patient.     Michiel Cowboy, RN   12/21/2017    Medical screening examination/treatment/procedure(s) were performed by non-physician practitioner and as supervising physician I was immediately available for consultation/collaboration. I agree with above. [name], MD

## 2017-12-21 NOTE — Patient Instructions (Addendum)
[employer] [facility] at [phone]  1-800-QUIT-NOW [phone]). [email or web address]  Continue doing brain stimulating activities (puzzles, reading, adult coloring books, staying active) to keep memory sharp.   Continue to eat heart healthy diet (full of fruits, vegetables, whole grains, lean protein, water--limit salt, fat, and sugar intake) and increase physical activity as tolerated.   Ms. Hannah [name] , Thank you for taking time to come for your Medicare Wellness Visit. I appreciate your ongoing commitment to your health goals. Please review the following plan we discussed and let me know if I can assist you in the future.   These are the goals we discussed: Goals    . LDL CALC < 100     LDL goal < 100 preferably.  Non-HDL goal < 130    . Patient Stated     I want to eat healthy to control cholesterol and prevent diabetes.       This is a list of the screening recommended for you and due dates:  Health Maintenance  Topic Date Due  . Flu Shot  01/28/2018  . Mammogram  01/22/2019  . Colon Cancer Screening  04/25/2019  . DEXA scan (bone density measurement)  09/02/2021  . Tetanus Vaccine  05/30/2024  .  Hepatitis C: One time screening is recommended by Center for Disease Control  (CDC) for  adults born from [birth year] through 1967.   Completed  . Pneumonia vaccines  Completed    Carbohydrate Counting for Diabetes Mellitus, Adult Carbohydrate counting is a method for keeping track of how many carbohydrates you eat. Eating carbohydrates naturally increases the amount of sugar (glucose) in the blood. Counting how many carbohydrates you eat helps keep your blood glucose within normal limits, which helps you manage your diabetes (diabetes mellitus). It is important to know how many carbohydrates you can safely have in each meal. This is different for every person. A diet and nutrition specialist  (registered dietitian) can help you make a meal plan and calculate how many carbohydrates you should have at each meal and snack. Carbohydrates are found in the following foods:  Grains, such as breads and cereals.  Dried beans and soy products.  Starchy vegetables, such as potatoes, peas, and corn.  Fruit and fruit juices.  Milk and yogurt.  Sweets and snack foods, such as cake, cookies, candy, chips, and soft drinks.  How do I count carbohydrates? There are two ways to count carbohydrates in food. You can use either of the methods or a combination of both. Reading "Nutrition Facts" on packaged food The "Nutrition Facts" list is included on the labels of almost all packaged foods and beverages in the [place] It includes:  The serving size.  Information about nutrients in each serving, including the grams (g) of carbohydrate per serving.  To use the "Nutrition Facts":  Decide how many servings you will have.  Multiply the number of servings by the number of carbohydrates per serving.  The resulting number is the total amount of carbohydrates that you will be having.  Learning standard serving sizes of other foods When you eat foods containing carbohydrates that are not packaged or do not include "Nutrition Facts" on the label, you need to measure the servings in order to count the amount of carbohydrates:  Measure the foods that you will eat with a food scale or measuring cup, if needed.  Decide how many standard-size servings you will eat.  Multiply the number of servings by 15. Most carbohydrate-rich foods have about 15 g of  carbohydrates per serving. ? For example, if you eat 8 oz (170 g) of strawberries, you will have eaten 2 servings and 30 g of carbohydrates (2 servings x 15 g = 30 g).  For foods that have more than one food mixed, such as soups and casseroles, you must count the carbohydrates in each food that is included.  The following list contains standard serving  sizes of common carbohydrate-rich foods. Each of these servings has about 15 g of carbohydrates:   hamburger bun or  English muffin.   oz (15 mL) syrup.   oz (14 g) jelly.  1 slice of bread.  1 six-inch tortilla.  3 oz (85 g) cooked rice or pasta.  4 oz (113 g) cooked dried beans.  4 oz (113 g) starchy vegetable, such as peas, corn, or potatoes.  4 oz (113 g) hot cereal.  4 oz (113 g) mashed potatoes or  of a large baked potato.  4 oz (113 g) canned or frozen fruit.  4 oz (120 mL) fruit juice.  4-6 crackers.  6 chicken nuggets.  6 oz (170 g) unsweetened dry cereal.  6 oz (170 g) plain fat-free yogurt or yogurt sweetened with artificial sweeteners.  8 oz (240 mL) milk.  8 oz (170 g) fresh fruit or one small piece of fruit.  24 oz (680 g) popped popcorn.  Example of carbohydrate counting Sample meal  3 oz (85 g) chicken breast.  6 oz (170 g) brown rice.  4 oz (113 g) corn.  8 oz (240 mL) milk.  8 oz (170 g) strawberries with sugar-free whipped topping. Carbohydrate calculation 1. Identify the foods that contain carbohydrates: ? Rice. ? Corn. ? Milk. ? Strawberries. 2. Calculate how many servings you have of each food: ? 2 servings rice. ? 1 serving corn. ? 1 serving milk. ? 1 serving strawberries. 3. Multiply each number of servings by 15 g: ? 2 servings rice x 15 g = 30 g. ? 1 serving corn x 15 g = 15 g. ? 1 serving milk x 15 g = 15 g. ? 1 serving strawberries x 15 g = 15 g. 4. Add together all of the amounts to find the total grams of carbohydrates eaten: ? 30 g + 15 g + 15 g + 15 g = 75 g of carbohydrates total. This information is not intended to replace advice given to you by your health care provider. Make sure you discuss any questions you have with your health care provider. Document Released: 06/16/2005 Document Revised: 01/04/2016 Document Reviewed: 11/28/2015 Elsevier Interactive Patient Education  2018 [employer].  Fat and  Cholesterol Restricted Diet Getting too much fat and cholesterol in your diet may cause health problems. Following this diet helps keep your fat and cholesterol at normal levels. This can keep you from getting sick. What types of fat should I choose?  Choose monosaturated and polyunsaturated fats. These are found in foods such as olive oil, canola oil, flaxseeds, walnuts, almonds, and seeds.  Eat more omega-3 fats. Good choices include salmon, mackerel, sardines, tuna, flaxseed oil, and ground flaxseeds.  Limit saturated fats. These are in animal products such as meats, butter, and cream. They can also be in plant products such as palm oil, palm kernel oil, and coconut oil.  Avoid foods with partially hydrogenated oils in them. These contain trans fats. Examples of foods that have trans fats are stick margarine, some tub margarines, cookies, crackers, and other baked goods. What general guidelines  do I need to follow?  Check food labels. Look for the words "trans fat" and "saturated fat."  When preparing a meal: ? Fill half of your plate with vegetables and green salads. ? Fill one fourth of your plate with whole grains. Look for the word "whole" as the first word in the ingredient list. ? Fill one fourth of your plate with lean protein foods.  Eat more foods that have fiber, like apples, carrots, beans, peas, and barley.  Eat more home-cooked foods. Eat less at restaurants and buffets.  Limit or avoid alcohol.  Limit foods high in starch and sugar.  Limit fried foods.  Cook foods without frying them. Baking, boiling, grilling, and broiling are all great options.  Lose weight if you are overweight. Losing even a small amount of weight can help your overall health. It can also help prevent diseases such as diabetes and heart disease. What foods can I eat? Grains Whole grains, such as whole wheat or whole grain breads, crackers, cereals, and pasta. Unsweetened oatmeal, bulgur,  barley, quinoa, or brown rice. Corn or whole wheat flour tortillas. Vegetables Fresh or frozen vegetables (raw, steamed, roasted, or grilled). Green salads. Fruits All fresh, canned (in natural juice), or frozen fruits. Meat and Other Protein Products Ground beef (85% or leaner), grass-fed beef, or beef trimmed of fat. Skinless chicken or [place]. Ground chicken or [place]. Pork trimmed of fat. All fish and seafood. Eggs. Dried beans, peas, or lentils. Unsalted nuts or seeds. Unsalted canned or dry beans. Dairy Low-fat dairy products, such as skim or 1% milk, 2% or reduced-fat cheeses, low-fat ricotta or cottage cheese, or plain low-fat yogurt. Fats and Oils Tub margarines without trans fats. Light or reduced-fat mayonnaise and salad dressings. Avocado. Olive, canola, sesame, or safflower oils. Natural peanut or almond butter (choose ones without added sugar and oil). The items listed above may not be a complete list of recommended foods or beverages. Contact your dietitian for more options. What foods are not recommended? Grains White bread. White pasta. White rice. Cornbread. Bagels, pastries, and croissants. Crackers that contain trans fat. Vegetables White potatoes. Corn. Creamed or fried vegetables. Vegetables in a cheese sauce. Fruits Dried fruits. Canned fruit in light or heavy syrup. Fruit juice. Meat and Other Protein Products Fatty cuts of meat. Ribs, chicken wings, bacon, sausage, bologna, salami, chitterlings, fatback, hot dogs, bratwurst, and packaged luncheon meats. Liver and organ meats. Dairy Whole or 2% milk, cream, half-and-half, and cream cheese. Whole milk cheeses. Whole-fat or sweetened yogurt. Full-fat cheeses. Nondairy creamers and whipped toppings. Processed cheese, cheese spreads, or cheese curds. Sweets and Desserts Corn syrup, sugars, honey, and molasses. Candy. Jam and jelly. Syrup. Sweetened cereals. Cookies, pies, cakes, donuts, muffins, and ice cream. Fats and  Oils Butter, stick margarine, lard, shortening, ghee, or bacon fat. Coconut, palm kernel, or palm oils. Beverages Alcohol. Sweetened drinks (such as sodas, lemonade, and fruit drinks or punches). The items listed above may not be a complete list of foods and beverages to avoid. Contact your dietitian for more information. This information is not intended to replace advice given to you by your health care provider. Make sure you discuss any questions you have with your health care provider. Document Released: 12/16/2011 Document Revised: 02/21/2016 Document Reviewed: 09/15/2013 Elsevier Interactive Patient Education  2018 [place] DASH stands for "Dietary Approaches to Stop Hypertension." The DASH eating plan is a healthy eating plan that has been shown to reduce high blood pressure (  hypertension). It may also reduce your risk for type 2 diabetes, heart disease, and stroke. The DASH eating plan may also help with weight loss. What are tips for following this plan? General guidelines  Avoid eating more than 2,300 mg (milligrams) of salt (sodium) a day. If you have hypertension, you may need to reduce your sodium intake to 1,500 mg a day.  Limit alcohol intake to no more than 1 drink a day for nonpregnant women and 2 drinks a day for men. One drink equals 12 oz of beer, 5 oz of Danyelle [place], or 1 oz of hard liquor.  Work with your health care provider to maintain a healthy body weight or to lose weight. Ask what an ideal weight is for you.  Get at least 30 minutes of exercise that causes your heart to beat faster (aerobic exercise) most days of the week. Activities may include walking, swimming, or biking.  Work with your health care provider or diet and nutrition specialist (dietitian) to adjust your eating plan to your individual calorie needs. Reading food labels  Check food labels for the amount of sodium per serving. Choose foods with less than 5 percent of the Daily Value  of sodium. Generally, foods with less than 300 mg of sodium per serving fit into this eating plan.  To find whole grains, look for the word "whole" as the first word in the ingredient list. Shopping  Buy products labeled as "low-sodium" or "no salt added."  Buy fresh foods. Avoid canned foods and premade or frozen meals. Cooking  Avoid adding salt when cooking. Use salt-free seasonings or herbs instead of table salt or sea salt. Check with your health care provider or pharmacist before using salt substitutes.  Do not fry foods. Cook foods using healthy methods such as baking, boiling, grilling, and broiling instead.  Cook with heart-healthy oils, such as olive, canola, soybean, or sunflower oil. Meal planning   Eat a balanced diet that includes: ? 5 or more servings of fruits and vegetables each day. At each meal, try to fill half of your plate with fruits and vegetables. ? Up to 6-8 servings of whole grains each day. ? Less than 6 oz of lean meat, poultry, or fish each day. A 3-oz serving of meat is about the same size as a deck of cards. One egg equals 1 oz. ? 2 servings of low-fat dairy each day. ? A serving of nuts, seeds, or beans 5 times each week. ? Heart-healthy fats. Healthy fats called Omega-3 fatty acids are found in foods such as flaxseeds and coldwater fish, like sardines, salmon, and mackerel.  Limit how much you eat of the following: ? Canned or prepackaged foods. ? Food that is high in trans fat, such as fried foods. ? Food that is high in saturated fat, such as fatty meat. ? Sweets, desserts, sugary drinks, and other foods with added sugar. ? Full-fat dairy products.  Do not salt foods before eating.  Try to eat at least 2 vegetarian meals each week.  Eat more home-cooked food and less restaurant, buffet, and fast food.  When eating at a restaurant, ask that your food be prepared with less salt or no salt, if possible. What foods are recommended? The items  listed may not be a complete list. Talk with your dietitian about what dietary choices are best for you. Grains Whole-grain or whole-wheat bread. Whole-grain or whole-wheat pasta. Brown rice. [place]. Bulgur. Whole-grain and low-sodium cereals. Pita bread. Low-fat, low-sodium  crackers. Whole-wheat flour tortillas. Vegetables Fresh or frozen vegetables (raw, steamed, roasted, or grilled). Low-sodium or reduced-sodium tomato and vegetable juice. Low-sodium or reduced-sodium tomato sauce and tomato paste. Low-sodium or reduced-sodium canned vegetables. Fruits All fresh, dried, or frozen fruit. Canned fruit in natural juice (without added sugar). Meat and other protein foods Skinless chicken or [place]. Ground chicken or [place]. Pork with fat trimmed off. Fish and seafood. Egg whites. Dried beans, peas, or lentils. Unsalted nuts, nut butters, and seeds. Unsalted canned beans. Lean cuts of beef with fat trimmed off. Low-sodium, lean deli meat. Dairy Low-fat (1%) or fat-free (skim) milk. Fat-free, low-fat, or reduced-fat cheeses. Nonfat, low-sodium ricotta or cottage cheese. Low-fat or nonfat yogurt. Low-fat, low-sodium cheese. Fats and oils Soft margarine without trans fats. Vegetable oil. Low-fat, reduced-fat, or light mayonnaise and salad dressings (reduced-sodium). Canola, safflower, olive, soybean, and sunflower oils. Avocado. Seasoning and other foods Herbs. Spices. Seasoning mixes without salt. Unsalted popcorn and pretzels. Fat-free sweets. What foods are not recommended? The items listed may not be a complete list. Talk with your dietitian about what dietary choices are best for you. Grains Baked goods made with fat, such as croissants, muffins, or some breads. Dry pasta or rice meal packs. Vegetables Creamed or fried vegetables. Vegetables in a cheese sauce. Regular canned vegetables (not low-sodium or reduced-sodium). Regular canned tomato sauce and paste (not low-sodium or  reduced-sodium). Regular tomato and vegetable juice (not low-sodium or reduced-sodium). Angie Fava. Olives. Fruits Canned fruit in a light or heavy syrup. Fried fruit. Fruit in cream or butter sauce. Meat and other protein foods Fatty cuts of meat. Ribs. Fried meat. Berniece Salines. Sausage. Bologna and other processed lunch meats. Salami. Fatback. Hotdogs. Bratwurst. Salted nuts and seeds. Canned beans with added salt. Canned or smoked fish. Whole eggs or egg yolks. Chicken or [place] with skin. Dairy Whole or 2% milk, cream, and half-and-half. Whole or full-fat cream cheese. Whole-fat or sweetened yogurt. Full-fat cheese. Nondairy creamers. Whipped toppings. Processed cheese and cheese spreads. Fats and oils Butter. Stick margarine. Lard. Shortening. Ghee. Bacon fat. Tropical oils, such as coconut, palm kernel, or palm oil. Seasoning and other foods Salted popcorn and pretzels. Onion salt, garlic salt, seasoned salt, table salt, and sea salt. Worcestershire sauce. Tartar sauce. Barbecue sauce. Teriyaki sauce. Soy sauce, including reduced-sodium. Steak sauce. Canned and packaged gravies. Fish sauce. Oyster sauce. Cocktail sauce. Horseradish that you find on the shelf. Ketchup. Mustard. Meat flavorings and tenderizers. Bouillon cubes. Hot sauce and Tabasco sauce. Premade or packaged marinades. Premade or packaged taco seasonings. Relishes. Regular salad dressings. Where to find more information:  National Heart, Lung, and [place]: https://wilson-eaton.com/  American Heart Association: www.heart.org Summary  The DASH eating plan is a healthy eating plan that has been shown to reduce high blood pressure (hypertension). It may also reduce your risk for type 2 diabetes, heart disease, and stroke.  With the DASH eating plan, you should limit salt (sodium) intake to 2,300 mg a day. If you have hypertension, you may need to reduce your sodium intake to 1,500 mg a day.  When on the DASH eating plan, aim to eat more  fresh fruits and vegetables, whole grains, lean proteins, low-fat dairy, and heart-healthy fats.  Work with your health care provider or diet and nutrition specialist (dietitian) to adjust your eating plan to your individual calorie needs. This information is not intended to replace advice given to you by your health care provider. Make sure you discuss any questions you have with your  health care provider. Document Released: 06/05/2011 Document Revised: 06/09/2016 Document Reviewed: 06/09/2016 Elsevier Interactive Patient Education  2018 [place].  High-Fiber Diet Fiber, also called dietary fiber, is a type of carbohydrate found in fruits, vegetables, whole grains, and beans. A high-fiber diet can have many health benefits. Your health care provider may recommend a high-fiber diet to help:  Prevent constipation. Fiber can make your bowel movements more regular.  Lower your cholesterol.  Relieve hemorrhoids, uncomplicated diverticulosis, or irritable bowel syndrome.  Prevent overeating as part of a weight-loss plan.  Prevent heart disease, type 2 diabetes, and certain cancers.  What is my plan? The recommended daily intake of fiber includes:  38 grams for men under age 38.  66 grams for men over age 59.  43 grams for women under age 65.  34 grams for women over age 65.  You can get the recommended daily intake of dietary fiber by eating a variety of fruits, vegetables, grains, and beans. Your health care provider may also recommend a fiber supplement if it is not possible to get enough fiber through your diet. What do I need to know about a high-fiber diet?  Fiber supplements have not been widely studied for their effectiveness, so it is better to get fiber through food sources.  Always check the fiber content on thenutrition facts label of any prepackaged food. Look for foods that contain at least 5 grams of fiber per serving.  Ask your dietitian if you have questions  about specific foods that are related to your condition, especially if those foods are not listed in the following section.  Increase your daily fiber consumption gradually. Increasing your intake of dietary fiber too quickly may cause bloating, cramping, or gas.  Drink plenty of water. Water helps you to digest fiber. What foods can I eat? Grains Whole-grain breads. Multigrain cereal. Oats and oatmeal. Brown rice. Barley. Bulgur wheat. [place]. Bran muffins. Popcorn. Rye wafer crackers. Vegetables Sweet potatoes. Spinach. Kale. Artichokes. Cabbage. Broccoli. Green peas. Carrots. Squash. Fruits Berries. Pears. Apples. Oranges. Avocados. Prunes and raisins. Dried figs. Meats and Other Protein Sources [place], kidney, pinto, and soy beans. Split peas. Lentils. Nuts and seeds. Dairy Fiber-fortified yogurt. Beverages Fiber-fortified soy milk. Fiber-fortified orange juice. Other Fiber bars. The items listed above may not be a complete list of recommended foods or beverages. Contact your dietitian for more options. What foods are not recommended? Grains White bread. Pasta made with refined flour. White rice. Vegetables Fried potatoes. Canned vegetables. Well-cooked vegetables. Fruits Fruit juice. Cooked, strained fruit. Meats and Other Protein Sources Fatty cuts of meat. Fried Sales executive or fried fish. Dairy Milk. Yogurt. Cream cheese. Sour cream. Beverages Soft drinks. Other Cakes and pastries. Butter and oils. The items listed above may not be a complete list of foods and beverages to avoid. Contact your dietitian for more information. What are some tips for including high-fiber foods in my diet?  Eat a wide variety of high-fiber foods.  Make sure that half of all grains consumed each day are whole grains.  Replace breads and cereals made from refined flour or white flour with whole-grain breads and cereals.  Replace white rice with brown rice, bulgur wheat, or millet.  Start the  day with a breakfast that is high in fiber, such as a cereal that contains at least 5 grams of fiber per serving.  Use beans in place of meat in soups, salads, or pasta.  Eat high-fiber snacks, such as berries, raw vegetables, nuts, or  popcorn. This information is not intended to replace advice given to you by your health care provider. Make sure you discuss any questions you have with your health care provider. Document Released: 06/16/2005 Document Revised: 11/22/2015 Document Reviewed: 11/29/2013 Elsevier Interactive Patient Education  [employer].

## 2018-01-08 ENCOUNTER — Other Ambulatory Visit: Payer: Self-pay

## 2018-01-08 ENCOUNTER — Ambulatory Visit (AMBULATORY_SURGERY_CENTER): Payer: Medicare HMO | Admitting: Internal Medicine

## 2018-01-08 ENCOUNTER — Encounter: Payer: Self-pay | Admitting: Internal Medicine

## 2018-01-08 VITALS — BP 145/56 | HR 60 | Temp 87.7°F | Resp 12 | Ht 65.0 in | Wt 119.0 lb

## 2018-01-08 DIAGNOSIS — R1319 Other dysphagia: Secondary | ICD-10-CM

## 2018-01-08 DIAGNOSIS — R1310 Dysphagia, unspecified: Secondary | ICD-10-CM

## 2018-01-08 MED ORDER — SODIUM CHLORIDE 0.9 % IV SOLN: 500.0000 mL | Freq: Once | INTRAVENOUS | Status: DC

## 2018-01-08 NOTE — Op Note (Signed)
[facility] Patient Name: Hannah [name] Procedure Date: 01/08/2018 2:50 PM MRN: [id number] Endoscopist: Jerene Bears , MD Age: 67 Referring MD:  Date of Birth: [birth year] Gender: Female Account #: [id number] Procedure:                Upper GI endoscopy Indications:              Dysphagia, Gastro-esophageal reflux disease Medicines:                Monitored Anesthesia Care Procedure:                Pre-Anesthesia Assessment:                           - Prior to the procedure, a History and Physical                            was performed, and patient medications and                            allergies were reviewed. The patient's tolerance of                            previous anesthesia was also reviewed. The risks                            and benefits of the procedure and the sedation                            options and risks were discussed with the patient.                            All questions were answered, and informed consent                            was obtained. Prior Anticoagulants: The patient has                            taken no previous anticoagulant or antiplatelet                            agents. ASA Grade Assessment: II - A patient with                            mild systemic disease. After reviewing the risks                            and benefits, the patient was deemed in                            satisfactory condition to undergo the procedure.                           After obtaining informed consent, the endoscope was  passed under direct vision. Throughout the                            procedure, the patient's blood pressure, pulse, and                            oxygen saturations were monitored continuously. The                            Endoscope was introduced through the mouth, and                            advanced to the second part of duodenum. The upper                            GI endoscopy  was accomplished without difficulty,                            though intubation of the upper endoscope through                            the UES was moderately difficult. On careful                            withdrawal no stricture was seen in this location.                            The patient tolerated the procedure well. Scope In: Scope Out: Findings:                 Normal mucosa was found in the entire esophagus.                           A 1 cm hiatal hernia was present.                           The entire examined stomach was normal.                           The examined duodenum was normal. Complications:            No immediate complications. Estimated Blood Loss:     Estimated blood loss: none. Impression:               - Normal mucosa was found in the entire esophagus.                           - 1 cm hiatal hernia.                           - Normal stomach.                           - Normal examined duodenum.                           -  No specimens collected. Recommendation:           - Patient has a contact number available for                            emergencies. The signs and symptoms of potential                            delayed complications were discussed with the                            patient. Return to normal activities tomorrow.                            Written discharge instructions were provided to the                            patient.                           - Resume previous diet.                           - Continue present medications.                           - Perform a barium swallow using barium in liquid                            and tablet form at appointment to be scheduled.                            Rule out Zenker's diverticulum. Jerene Bears, MD 01/08/2018 3:16:51 PM This report has been signed electronically.

## 2018-01-08 NOTE — Patient Instructions (Signed)
Dr Vena Rua nurse will schedule you for a barium swallow.  Her phone number is [phone].   YOU HAD AN ENDOSCOPIC PROCEDURE TODAY AT [facility]:   Refer to the procedure report that was given to you for any specific questions about what was found during the examination.  If the procedure report does not answer your questions, please call your gastroenterologist to clarify.  If you requested that your care partner not be given the details of your procedure findings, then the procedure report has been included in a sealed envelope for you to review at your convenience later.  YOU SHOULD EXPECT: Some feelings of bloating in the abdomen. Passage of more gas than usual.  Walking can help get rid of the air that was put into your GI tract during the procedure and reduce the bloating. If you had a lower endoscopy (such as a colonoscopy or flexible sigmoidoscopy) you may notice spotting of blood in your stool or on the toilet paper. If you underwent a bowel prep for your procedure, you may not have a normal bowel movement for a few days.  Please Note:  You might notice some irritation and congestion in your nose or some drainage.  This is from the oxygen used during your procedure.  There is no need for concern and it should clear up in a day or so.  SYMPTOMS TO REPORT IMMEDIATELY:    Following upper endoscopy (EGD)  Vomiting of blood or coffee ground material  New chest pain or pain under the shoulder blades  Painful or persistently difficult swallowing  New shortness of breath  Fever of 100F or higher  Black, tarry-looking stools  For urgent or emergent issues, a gastroenterologist can be reached at any hour by calling [phone].   DIET:  We do recommend a small meal at first, but then you may proceed to your regular diet.  Drink plenty of fluids but you should avoid alcoholic beverages for 24 hours.  ACTIVITY:  You should plan to take it easy for the rest of today and  you should NOT DRIVE or use heavy machinery until tomorrow (because of the sedation medicines used during the test).    FOLLOW UP: Our staff will call the number listed on your records the next business day following your procedure to check on you and address any questions or concerns that you may have regarding the information given to you following your procedure. If we do not reach you, we will leave a message.  However, if you are feeling well and you are not experiencing any problems, there is no need to return our call.  We will assume that you have returned to your regular daily activities without incident.  If any biopsies were taken you will be contacted by phone or by letter within the next 1-3 weeks.  Please call us at [phone] if you have not heard about the biopsies in 3 weeks.    SIGNATURES/CONFIDENTIALITY: You and/or your care partner have signed paperwork which will be entered into your electronic medical record.  These signatures attest to the fact that that the information above on your After Visit Summary has been reviewed and is understood.  Full responsibility of the confidentiality of this discharge information lies with you and/or your care-partner.

## 2018-01-08 NOTE — Progress Notes (Signed)
Alert and oriented x3, pleased with MAC, report to RN  

## 2018-01-11 ENCOUNTER — Other Ambulatory Visit: Payer: Self-pay

## 2018-01-11 ENCOUNTER — Telehealth: Payer: Self-pay | Admitting: *Deleted

## 2018-01-11 ENCOUNTER — Telehealth: Payer: Self-pay

## 2018-01-11 DIAGNOSIS — R1310 Dysphagia, unspecified: Secondary | ICD-10-CM

## 2018-01-11 NOTE — Telephone Encounter (Signed)
  Follow up Call-  Call back number 01/08/2018 04/24/2016  Post procedure Call Back phone  # [phone]  Permission to leave phone message Yes Yes  Some recent data might be hidden     Patient questions:  Do you have a fever, pain , or abdominal swelling? No. Pain Score  0 *  Have you tolerated food without any problems? Yes.    Have you been able to return to your normal activities? Yes.    Do you have any questions about your discharge instructions: Diet   No. Medications  No. Follow up visit  No.  Do you have questions or concerns about your Care? No.  Actions: * If pain score is 4 or above: No action needed, pain <4.

## 2018-01-11 NOTE — Telephone Encounter (Signed)
Pt scheduled for barium swallow at [facility] 01/26/18@11am , pt to arrive there at 10:45am. Pt to be NPO after 8am. Pt aware.

## 2018-01-14 DIAGNOSIS — M542 Cervicalgia: Secondary | ICD-10-CM | POA: Diagnosis not present

## 2018-01-26 ENCOUNTER — Ambulatory Visit (HOSPITAL_COMMUNITY)
Admission: RE | Admit: 2018-01-26 | Discharge: 2018-01-26 | Disposition: A | Discharge status: Home / Self Care | Payer: Medicare HMO | Source: Ambulatory Visit | Attending: Internal Medicine | Admitting: Internal Medicine

## 2018-01-26 DIAGNOSIS — R1310 Dysphagia, unspecified: Secondary | ICD-10-CM | POA: Diagnosis not present

## 2018-02-26 DIAGNOSIS — L720 Epidermal cyst: Secondary | ICD-10-CM | POA: Diagnosis not present

## 2018-02-26 DIAGNOSIS — L819 Disorder of pigmentation, unspecified: Secondary | ICD-10-CM | POA: Diagnosis not present

## 2018-03-01 ENCOUNTER — Other Ambulatory Visit: Payer: Self-pay | Admitting: Internal Medicine

## 2018-03-09 DIAGNOSIS — L723 Sebaceous cyst: Secondary | ICD-10-CM | POA: Diagnosis not present

## 2018-03-09 NOTE — Progress Notes (Signed)
Subjective:    Patient ID: Hannah [name], female    DOB: October 12, 1950, 67 y.o.   MRN: [id number]  HPI The patient is here for follow up.  Hypertension: She is taking her medication daily. She is compliant with a low sodium diet.  She denies chest pain, palpitations, edema, shortness of breath and regular headaches. She is exercising regularly - twice a week.  She does not monitor her blood pressure at home.    GERD:  She is taking her medication daily as prescribed.  She denies any GERD symptoms and feels her GERD is well controlled.   Hyperlipidemia: She is taking her medication daily. She is compliant with a low fat/cholesterol diet. She is exercising regularly. She denies myalgias.   Tobacco abuse:  She does want to quit. She smokes 7-8 cigs a day. She has done  Anxiety: She is taking the xanax as needed. She denies any side effects from the medication. She feels her anxiety is well controlled and she is happy with her current dose of medication.   HRT:  She is taking the estrace daily as prescribed.  We did discuss at her last visit that she should be taking half of the medication and slowly tapering off the medication, but she has not done that yet.  Discussed increased risks the older she gets, especially in light of smoking.  She is still having trouble gaining weight.  She thinks she is eating as much as possible.  She would like to gain a few pounds.  Medications and allergies reviewed with patient and updated if appropriate.  Patient Active Problem List   Diagnosis Date Noted  . Bilateral carotid artery stenosis 09/24/2017  . Aortic valve sclerosis 09/24/2017  . Tobacco abuse 09/24/2017  . Vocal cord polyps 09/07/2017  . Dysphagia 09/07/2017  . Cervical strain 07/24/2017  . Prediabetes 03/09/2017  . Chest pain 12/29/2016  . GERD (gastroesophageal reflux disease) 02/27/2016  . Hormone replacement therapy (HRT) 02/27/2016  . Anxiety 02/27/2016  . Osteoarthritis of left  knee 02/19/2014  . S/P total knee replacement 02/13/2014  . RBBB 02/08/2014  . HTN (hypertension) 02/08/2014  . PVD - bilateral 60-79% carotid strenosis 02/08/2014  . Mixed hyperlipidemia 11/29/2013  . Carpal tunnel syndrome, bilateral 11/23/2013  . DJD (degenerative joint disease) 11/23/2013  . Murmur- mild -mod AR, mild MR 11/10/2013  . Chronic constipation 12/16/2012  . Arthritis 10/08/2012  . Nicotine abuse 12/29/2011    Current Outpatient Medications on File Prior to Visit  Medication Sig Dispense Refill  . ALPRAZolam (XANAX) 0.5 MG tablet Take 1 tablet (0.5 mg total) by mouth 2 (two) times daily as needed. for sleep 60 tablet 5  . Ascorbic Acid (VITAMIN C PO) Take 1 tablet by mouth daily.    Marland Kitchen aspirin 81 MG tablet Take 81 mg by mouth daily.    . bisoprolol-hydrochlorothiazide (ZIAC) 5-6.25 MG tablet TAKE 1/2 TABLET EVERY DAY 45 tablet 1  . Calcium Carbonate (CALCIUM 600 PO) Take 1,200 mg by mouth daily.    . cholecalciferol (VITAMIN D) 1000 UNITS tablet Take 1 tablet (1,000 Units total) by mouth daily. 30 tablet 5  . estradiol (ESTRACE) 0.5 MG tablet TAKE 1 TABLET EVERY DAY 90 tablet 1  . fenofibrate 160 MG tablet Take 1 tablet (160 mg total) by mouth daily. 90 tablet 3  . fexofenadine (ALLEGRA) 180 MG tablet Take 180 mg by mouth daily.    Marland Kitchen linaclotide (LINZESS) 290 MCG CAPS capsule Take 1 capsule (  290 mcg total) by mouth daily before breakfast. 30 capsule 3  . meloxicam (MOBIC) 15 MG tablet Take 1 tablet (15 mg total) by mouth daily. 30 tablet 0  . methocarbamol (ROBAXIN) 500 MG tablet Take 1 tablet (500 mg total) by mouth every 6 (six) hours as needed for muscle spasms. 40 tablet 0  . omeprazole (PRILOSEC) 40 MG capsule TAKE 1 CAPSULE 2 TIMES DAILY BEFORE LUNCH AND SUPPER. 180 capsule 1  . pravastatin (PRAVACHOL) 20 MG tablet TAKE 1 TABLET EVERY EVENING 90 tablet 1  . Probiotic Product (PROBIOTIC DAILY PO) Take 1 capsule by mouth daily.     No current facility-administered  medications on file prior to visit.     Past Medical History:  Diagnosis Date  . Allergy   . Anxiety   . Arthritis   . Carpal tunnel syndrome   . Depression   . Diverticulosis   . Dyslipidemia   . External hemorrhoids   . GERD (gastroesophageal reflux disease)   . Heart murmur    mild-moderate AR  . Hyperlipidemia    on meds   . Hypertension   . Internal hemorrhoids   . Osteoarthritis   . PVD (peripheral vascular disease) (HCC)    moderate carotid disease  . RBBB   . Smoker   . Tubular adenoma of colon   . Vocal cord polyps     Past Surgical History:  Procedure Laterality Date  . ABDOMINAL HYSTERECTOMY  1994  . COLONOSCOPY    . COLONOSCOPY W/ POLYPECTOMY  2009  . HEMORRHOID SURGERY    . SPINE SURGERY  08/16/2009  . TOTAL KNEE ARTHROPLASTY Left 02/13/2014   Procedure: TOTAL KNEE ARTHROPLASTY;  Surgeon: Alta Corning, MD;  Location: [facility];  Service: Orthopedics;  Laterality: Left;  . TUBAL LIGATION      Social History   Socioeconomic History  . Marital status: Divorced    Spouse name: Not on file  . Number of children: 2  . Years of education: Not on file  . Highest education level: Not on file  Occupational History  . Not on file  Social Needs  . Financial resource strain: Not hard at all  . Food insecurity:    Worry: Never true    Inability: Never true  . Transportation needs:    Medical: No    Non-medical: No  Tobacco Use  . Smoking status: Light Tobacco Smoker    Packs/day: 0.50    Years: 40.00    Pack years: 20.00    Types: Cigarettes  . Smokeless tobacco: Never Used  . Tobacco comment: PACK WILL LAST 3 days  Substance and Sexual Activity  . Alcohol use: No  . Drug use: No  . Sexual activity: Never  Lifestyle  . Physical activity:    Days per week: 2 days    Minutes per session: 60 min  . Stress: To some extent  Relationships  . Social connections:    Talks on phone: More than three times a week    Gets together: More than three times a  week    Attends religious service: More than 4 times per year    Active member of club or organization: Yes    Attends meetings of clubs or organizations: More than 4 times per year    Relationship status: Married  Other Topics Concern  . Not on file  Social History Narrative  . Not on file    Family History  Problem Relation Age of  Onset  . Cancer Father   . Prostate cancer Father   . Diabetes Sister   . Stroke Maternal Grandfather   . Colon cancer Neg Hx   . Colon polyps Neg Hx   . Rectal cancer Neg Hx   . Stomach cancer Neg Hx   . Esophageal cancer Neg Hx     Review of Systems  Constitutional: Negative for fever.  Respiratory: Negative for cough, shortness of breath and wheezing.   Cardiovascular: Negative for chest pain, palpitations and leg swelling.  Neurological: Negative for light-headedness and headaches.       Objective:   Vitals:   03/10/18 0846  BP: 126/66  Pulse: (!) 56  Resp: 16  Temp: 98.5 F (36.9 C)  SpO2: 96%   BP Readings from Last 3 Encounters:  03/10/18 126/66  01/08/18 (!) 145/56  12/21/17 122/66   Wt Readings from Last 3 Encounters:  03/10/18 115 lb (52.2 kg)  01/08/18 119 lb (54 kg)  12/21/17 121 lb (54.9 kg)   Body mass index is 19.14 kg/m.   Physical Exam    Constitutional: Appears well-developed and well-nourished. No distress.  HENT:  Head: Normocephalic and atraumatic.  Neck: Neck supple. No tracheal deviation present. No thyromegaly present.  No cervical lymphadenopathy Cardiovascular: Normal rate, regular rhythm and normal heart sounds.   2/6 systolic murmur heard. No carotid bruit .  No edema Pulmonary/Chest: Effort normal and breath sounds normal. No respiratory distress. No has no wheezes. No rales.  Skin: Skin is warm and dry. Not diaphoretic.  Psychiatric: Normal mood and affect. Behavior is normal.      Assessment & Plan:    See Problem List for Assessment and Plan of chronic medical problems.

## 2018-03-09 NOTE — Patient Instructions (Addendum)
  Test(s) ordered today. Your results will be released to [place] (or called to you) after review, usually within 72hours after test completion. If any changes need to be made, you will be notified at that same time.   Medications reviewed and updated.  Changes include take 1/2 of an estrace daily for two months and then take 1/2 of a pill every other day for a month or two and then stop.   Wellbutrin started to help with smoking cessation.    Your prescription(s) have been submitted to your pharmacy. Please take as directed and contact our office if you believe you are having problem(s) with the medication(s).   Please followup in 6 months

## 2018-03-10 ENCOUNTER — Encounter: Payer: Self-pay | Admitting: Internal Medicine

## 2018-03-10 ENCOUNTER — Ambulatory Visit (INDEPENDENT_AMBULATORY_CARE_PROVIDER_SITE_OTHER): Payer: Medicare HMO | Admitting: Internal Medicine

## 2018-03-10 ENCOUNTER — Other Ambulatory Visit (INDEPENDENT_AMBULATORY_CARE_PROVIDER_SITE_OTHER): Payer: Medicare HMO

## 2018-03-10 VITALS — BP 126/66 | HR 56 | Temp 98.5°F | Resp 16 | Ht 65.0 in | Wt 115.0 lb

## 2018-03-10 DIAGNOSIS — R7303 Prediabetes: Secondary | ICD-10-CM | POA: Diagnosis not present

## 2018-03-10 DIAGNOSIS — K219 Gastro-esophageal reflux disease without esophagitis: Secondary | ICD-10-CM | POA: Diagnosis not present

## 2018-03-10 DIAGNOSIS — E782 Mixed hyperlipidemia: Secondary | ICD-10-CM

## 2018-03-10 DIAGNOSIS — Z79890 Hormone replacement therapy (postmenopausal): Secondary | ICD-10-CM

## 2018-03-10 DIAGNOSIS — Z720 Tobacco use: Secondary | ICD-10-CM

## 2018-03-10 DIAGNOSIS — F419 Anxiety disorder, unspecified: Secondary | ICD-10-CM | POA: Diagnosis not present

## 2018-03-10 DIAGNOSIS — F17210 Nicotine dependence, cigarettes, uncomplicated: Secondary | ICD-10-CM

## 2018-03-10 DIAGNOSIS — I10 Essential (primary) hypertension: Secondary | ICD-10-CM | POA: Diagnosis not present

## 2018-03-10 LAB — LIPID PANEL
Cholesterol: 138 mg/dL (ref 0–200)
HDL: 52 mg/dL (ref >39.00)
LDL Cholesterol: 60 mg/dL (ref 0–99)
NonHDL: 85.62
Total CHOL/HDL Ratio: 3
Triglycerides: 129 mg/dL (ref 0.0–149.0)
VLDL: 25.8 mg/dL (ref 0.0–40.0)

## 2018-03-10 LAB — COMPREHENSIVE METABOLIC PANEL
ALT: 17 U/L (ref 0–35)
AST: 24 U/L (ref 0–37)
Albumin: 4.3 g/dL (ref 3.5–5.2)
Alkaline Phosphatase: 33 U/L — ABNORMAL LOW (ref 39–117)
BUN: 9 mg/dL (ref 6–23)
CO2: 34 mEq/L — ABNORMAL HIGH (ref 19–32)
Calcium: 10 mg/dL (ref 8.4–10.5)
Chloride: 103 mEq/L (ref 96–112)
Creatinine, Ser: 0.84 mg/dL (ref 0.40–1.20)
GFR: 86.31 mL/min (ref >60.00)
Glucose, Bld: 86 mg/dL (ref 70–99)
Potassium: 4.1 mEq/L (ref 3.5–5.1)
Sodium: 142 mEq/L (ref 135–145)
Total Bilirubin: 0.4 mg/dL (ref 0.2–1.2)
Total Protein: 6.9 g/dL (ref 6.0–8.3)

## 2018-03-10 LAB — CBC WITH DIFFERENTIAL/PLATELET
Basophils Absolute: 0 10*3/uL (ref 0.0–0.1)
Basophils Relative: 0.6 % (ref 0.0–3.0)
Eosinophils Absolute: 0.2 10*3/uL (ref 0.0–0.7)
Eosinophils Relative: 3 % (ref 0.0–5.0)
HCT: 36.2 % (ref 36.0–46.0)
Hemoglobin: 11.7 g/dL — ABNORMAL LOW (ref 12.0–15.0)
Lymphocytes Relative: 42.1 % (ref 12.0–46.0)
Lymphs Abs: 2.6 10*3/uL (ref 0.7–4.0)
MCHC: 32.4 g/dL (ref 30.0–36.0)
MCV: 83.4 fl (ref 78.0–100.0)
Monocytes Absolute: 0.5 10*3/uL (ref 0.1–1.0)
Monocytes Relative: 8.7 % (ref 3.0–12.0)
Neutro Abs: 2.8 10*3/uL (ref 1.4–7.7)
Neutrophils Relative %: 45.6 % (ref 43.0–77.0)
Platelets: 250 10*3/uL (ref 150.0–400.0)
RBC: 4.34 Mil/uL (ref 3.87–5.11)
RDW: 13 % (ref 11.5–15.5)
WBC: 6.1 10*3/uL (ref 4.0–10.5)

## 2018-03-10 LAB — HEMOGLOBIN A1C: Hgb A1c MFr Bld: 6.2 % (ref 4.6–6.5)

## 2018-03-10 MED ORDER — ESTRADIOL 0.5 MG PO TABS: 0.2500 mg | ORAL_TABLET | Freq: Every day | ORAL | 1 refills | Status: DC

## 2018-03-10 MED ORDER — ALPRAZOLAM 0.5 MG PO TABS: 0.5000 mg | ORAL_TABLET | Freq: Two times a day (BID) | ORAL | 5 refills | Status: DC | PRN

## 2018-03-10 MED ORDER — FENOFIBRATE 160 MG PO TABS: 160.0000 mg | ORAL_TABLET | Freq: Every day | ORAL | 3 refills | Status: DC

## 2018-03-10 MED ORDER — BUPROPION HCL ER (XL) 150 MG PO TB24: 150.0000 mg | ORAL_TABLET | Freq: Every day | ORAL | 1 refills | Status: DC

## 2018-03-10 NOTE — Assessment & Plan Note (Signed)
Check lipid panel  Continue daily statin Regular exercise and healthy diet encouraged  

## 2018-03-10 NOTE — Assessment & Plan Note (Signed)
BP well controlled Current regimen effective and well tolerated Continue current medications at current doses cmp  

## 2018-03-10 NOTE — Assessment & Plan Note (Signed)
Check a1c Low sugar / carb diet Stressed regular exercise   

## 2018-03-10 NOTE — Assessment & Plan Note (Signed)
Stressed tapering off estrace -- advised her to take 1/2 tablet daily for a couple of months then every other day and then discontinue

## 2018-03-10 NOTE — Assessment & Plan Note (Addendum)
Smoking cessation was discussed for more than 3 minutes.  The patient was counseled on the dangers of tobacco use, and was advised to quit and wants to quit.  Reviewed ways of quitting smoking including nicotine replacement, vapping/e-cigarettes which I do not recommend, cold [place], weaning off cigarettes, and pharmacotherapy (wellbutrin and chantix).  Will try wellbutrin - has taken previously

## 2018-03-10 NOTE — Assessment & Plan Note (Signed)
GERD controlled Continue daily medication  

## 2018-03-10 NOTE — Assessment & Plan Note (Signed)
Taking xanax typically at nightly only - occasionally twice in one day

## 2018-03-15 ENCOUNTER — Ambulatory Visit (HOSPITAL_COMMUNITY)
Admission: RE | Admit: 2018-03-15 | Discharge: 2018-03-15 | Disposition: A | Discharge status: Home / Self Care | Payer: 59 | Source: Ambulatory Visit | Attending: Internal Medicine | Admitting: Internal Medicine

## 2018-03-15 DIAGNOSIS — I6523 Occlusion and stenosis of bilateral carotid arteries: Secondary | ICD-10-CM | POA: Insufficient documentation

## 2018-03-19 ENCOUNTER — Ambulatory Visit: Payer: Self-pay | Admitting: Internal Medicine

## 2018-03-19 NOTE — Telephone Encounter (Signed)
She is on a low dose of the medication and can just stop it.  No tapering is needed.

## 2018-03-19 NOTE — Telephone Encounter (Signed)
LVM for pt to call back in regards.  

## 2018-03-19 NOTE — Telephone Encounter (Signed)
Patient feels she is having side effects from medication- Wellbutrin- patient has cut down to 6 cigarettes/day.Praised patient her her efforts and continuation. Will send concerns to PCP for review and possible change of medication.  Reason for Disposition . Pharmacy calling with prescription questions and triager unable to answer question  Answer Assessment - Initial Assessment Questions 1. SYMPTOMS: "Do you have any symptoms?"     Patient started Wellbutrin on Sunday- and  She noticed she was mean yesterday. Patient feels nervous and jittery. Patient has cut back on her smoking - patient was smoking 8 cigarettes /day and now she is down to 6 /day 2. SEVERITY: If symptoms are present, ask "Are they mild, moderate or severe?"     Moderate- patient states when she smokes on the medication she gets a headache and racing heart.Patient states the mood swing are not her. Patient wants to stop smoking- patient is having trouble focusing and thinks it is the Wellbutrin. She does not want to give up- she either wants to change the Wellbutrin and try something else for her moods or continue.  Protocols used: MEDICATION QUESTION CALL-A-AH

## 2018-03-19 NOTE — Telephone Encounter (Signed)
What is the best way to have pt taper of medication?

## 2018-03-19 NOTE — Telephone Encounter (Signed)
Patient is experiencing terrible mood swings with medication. Wants to know how to ween herself off.

## 2018-03-22 NOTE — Telephone Encounter (Signed)
Spoke with pt and advised of note below. She stated that she was feeling better on the medication and she did not want to give up taking them just yet because of wanting to quit smoking.

## 2018-03-26 ENCOUNTER — Telehealth: Payer: Self-pay | Admitting: *Deleted

## 2018-03-26 DIAGNOSIS — I6523 Occlusion and stenosis of bilateral carotid arteries: Secondary | ICD-10-CM

## 2018-03-26 NOTE — Telephone Encounter (Addendum)
Advised patient of results, order placed, and recall in Epic   Notes recorded by Minus Breeding, MD on 03/25/2018 at 5:11 PM EDT Please note that the result was edited in error. There is no carotid body tumor on this patient. No further testing is indicated.  There is bilateral 40 - 59% stenosis in both carotids and she will need follow up carotid in one year.  Call Ms. Krempasky with the results and send results to [name], MD ------

## 2018-04-05 ENCOUNTER — Ambulatory Visit (INDEPENDENT_AMBULATORY_CARE_PROVIDER_SITE_OTHER): Payer: Medicare HMO | Admitting: Internal Medicine

## 2018-04-05 ENCOUNTER — Encounter: Payer: Self-pay | Admitting: Internal Medicine

## 2018-04-05 VITALS — BP 124/72 | HR 75 | Temp 97.9°F | Ht 65.0 in | Wt 114.0 lb

## 2018-04-05 DIAGNOSIS — J019 Acute sinusitis, unspecified: Secondary | ICD-10-CM | POA: Diagnosis not present

## 2018-04-05 DIAGNOSIS — I10 Essential (primary) hypertension: Secondary | ICD-10-CM

## 2018-04-05 DIAGNOSIS — R7303 Prediabetes: Secondary | ICD-10-CM | POA: Diagnosis not present

## 2018-04-05 DIAGNOSIS — Z720 Tobacco use: Secondary | ICD-10-CM

## 2018-04-05 MED ORDER — AZITHROMYCIN 250 MG PO TABS: ORAL_TABLET | ORAL | 1 refills | Status: DC

## 2018-04-05 MED ORDER — HYDROCODONE-HOMATROPINE 5-1.5 MG/5ML PO SYRP: 5.0000 mL | ORAL_SOLUTION | Freq: Four times a day (QID) | ORAL | 0 refills | Status: AC | PRN

## 2018-04-05 NOTE — Progress Notes (Signed)
Subjective:    Patient ID: Hannah [name], female    DOB: March 31, 1951, 67 y.o.   MRN: [id number]  HPI   Here with 8 days acute onset fever, facial pain, pressure, headache, general weakness and malaise, and greenish d/c, with mild ST and cough with bladder leakage, and left maxillary sinus and upper and lower lids puffiness, but pt denies chest pain, wheezing, increased sob or doe, orthopnea, PND, increased LE swelling, palpitations, dizziness or syncope.  Pt denies new neurological symptoms such as new headache, or facial or extremity weakness or numbness   Pt denies polydipsia, polyuria,  No other new complaints.  Still smoking, but less, could not tolerate the wellbutrin . Past Medical History:  Diagnosis Date  . Allergy   . Anxiety   . Arthritis   . Carpal tunnel syndrome   . Depression   . Diverticulosis   . Dyslipidemia   . External hemorrhoids   . GERD (gastroesophageal reflux disease)   . Heart murmur    mild-moderate AR  . Hyperlipidemia    on meds   . Hypertension   . Internal hemorrhoids   . Osteoarthritis   . PVD (peripheral vascular disease) (HCC)    moderate carotid disease  . RBBB   . Smoker   . Tubular adenoma of colon   . Vocal cord polyps    Past Surgical History:  Procedure Laterality Date  . ABDOMINAL HYSTERECTOMY  1994  . COLONOSCOPY    . COLONOSCOPY W/ POLYPECTOMY  2009  . HEMORRHOID SURGERY    . SPINE SURGERY  08/16/2009  . TOTAL KNEE ARTHROPLASTY Left 02/13/2014   Procedure: TOTAL KNEE ARTHROPLASTY;  Surgeon: Alta Corning, MD;  Location: [place];  Service: Orthopedics;  Laterality: Left;  . TUBAL LIGATION      reports that she has been smoking cigarettes. She has a 20.00 pack-year smoking history. She has never used smokeless tobacco. She reports that she does not drink alcohol or use drugs. family history includes Cancer in her father; Diabetes in her sister; Prostate cancer in her father; Stroke in her maternal grandfather. Allergies  Allergen  Reactions  . Amlodipine     Rash, swelling  . Chantix [Varenicline Tartrate] Other (See Comments)    Tongue swell,sob  . Clarithromycin Rash  . Lisinopril Hives  . Simvastatin Hives  . Wellbutrin [Bupropion Hcl] Hives  . Lipitor [Atorvastatin]     Dizziness per patient   Review of Systems  Constitutional: Negative for other unusual diaphoresis or sweats HENT: Negative for ear discharge or swelling Eyes: Negative for other worsening visual disturbances Respiratory: Negative for stridor or other swelling  Gastrointestinal: Negative for worsening distension or other blood Genitourinary: Negative for retention or other urinary change Musculoskeletal: Negative for other MSK pain or swelling Skin: Negative for color change or other new lesions Neurological: Negative for worsening tremors and other numbness  Psychiatric/Behavioral: Negative for worsening agitation or other fatigue All other system neg per pt    Objective:   Physical Exam BP 124/72   Pulse 75   Temp 97.9 F (36.6 C) (Oral)   Ht 5\' 5"  (1.651 m)   Wt 114 lb (51.7 kg)   SpO2 98%   BMI 18.97 kg/m  VS noted, mild ill Constitutional: Pt appears in NAD HENT: Head: NCAT.  Bilat tm's with mild erythema.  Max sinus areas mod tender left > right.  Pharynx with mild erythema, no exudate Right Ear: External ear normal.  Left Ear: External ear  normal.  Eyes: . Pupils are equal, round, and reactive to light. Conjunctivae and EOM are normal Nose: without d/c or deformity Neck: Neck supple. Gross normal ROM Cardiovascular: Normal rate and regular rhythm.   Pulmonary/Chest: Effort normal and breath sounds without rales or wheezing.  Neurological: Pt is alert. At baseline orientation, motor grossly intact Skin: Skin is warm. No rashes, other new lesions, no LE edema Psychiatric: Pt behavior is normal without agitation  No other exam findings Lab Results  Component Value Date   WBC 6.1 03/10/2018   HGB 11.7 (L) 03/10/2018     HCT 36.2 03/10/2018   PLT 250.0 03/10/2018   GLUCOSE 86 03/10/2018   CHOL 138 03/10/2018   TRIG 129.0 03/10/2018   HDL 52.00 03/10/2018   LDLDIRECT 87.0 11/09/2014   LDLCALC 60 03/10/2018   ALT 17 03/10/2018   AST 24 03/10/2018   NA 142 03/10/2018   K 4.1 03/10/2018   CL 103 03/10/2018   CREATININE 0.84 03/10/2018   BUN 9 03/10/2018   CO2 34 (H) 03/10/2018   TSH 0.83 08/28/2016   INR 1.06 02/08/2014   HGBA1C 6.2 03/10/2018        Assessment & Plan:

## 2018-04-05 NOTE — Assessment & Plan Note (Signed)
stable overall by history and exam, recent data reviewed with pt, and pt to continue medical treatment as before,  to f/u any worsening symptoms or concerns  

## 2018-04-05 NOTE — Assessment & Plan Note (Signed)
Urged to quit 

## 2018-04-05 NOTE — Patient Instructions (Addendum)
Please take all new medication as prescribed - the antibiotic, and cough medicine as needed  You can also take Delsym OTC for cough, and/or Mucinex (or it's generic off brand) for congestion, and tylenol as needed for pain.  Please continue all other medications as before, and refills have been done if requested.  Please have the pharmacy call with any other refills you may need.  Please keep your appointments with your specialists as you may have planned

## 2018-04-05 NOTE — Assessment & Plan Note (Signed)
Mild to mod, for antibx course,  to f/u any worsening symptoms or concerns 

## 2018-04-08 ENCOUNTER — Other Ambulatory Visit: Payer: Self-pay | Admitting: Orthopedic Surgery

## 2018-04-12 ENCOUNTER — Telehealth: Payer: Self-pay

## 2018-04-12 NOTE — Telephone Encounter (Signed)
   [facility] Pre-operative Risk Assessment    Request for surgical clearance:  1. What type of surgery is being performed? Right Total Knee Arthroplasty   2. When is this surgery scheduled? 04/30/18   3. What type of clearance is required (medical clearance vs. Pharmacy clearance to hold med vs. Both)? Both  4. Are there any medications that need to be held prior to surgery and how long?ASA   5. Practice name and name of physician performing surgery? [facility]   6. What is your office phone number [phone]    7.   What is your office fax number [phone] ATTN: Lacretia Nicks  8.   Anesthesia type (None, local, MAC, general) ? Spinal   Meryl Crutch 04/12/2018, 1:09 PM  _________________________________________________________________   (provider comments below)

## 2018-04-14 NOTE — Telephone Encounter (Signed)
   Primary Cardiologist: Minus Breeding, MD  Chart reviewed as part of pre-operative protocol coverage. Patient was contacted 04/14/2018 in reference to pre-operative risk assessment for pending surgery as outlined below.  [name] has a PMH of PVD, mild AS, and carotid artery stenosis and was last seen on 09/24/17 by Dr. Percival Spanish.  Since that day, ELOYSE CAUSEY has done well from a cardiac standpoint. She is limited in mobility by joint pain but is able to walk a couple blocks without experiencing chest pain or SOB. No complaints of dizziness or lightheadedness. No prior CAD, CHF, DM, or CKD history.   RCRI score is 0 with a 0.4%   Therefore, based on ACC/AHA guidelines, the patient would be at acceptable risk for the planned procedure without further cardiovascular testing.   She can hold aspirin 7 days prior to her procedure. She should restart this medication when cleared to do so by the surgery team.   I will route this recommendation to the requesting party via [facility] fax function and remove from pre-op pool.  Callback: - Please follow-up with Dr. Dorna Leitz' office to ensure they received preoperative clearance.   Abigail Butts, PA-C 04/14/2018, 5:01 PM

## 2018-04-15 NOTE — Telephone Encounter (Signed)
Clearance was resent.  Encounter closed.

## 2018-04-22 DIAGNOSIS — Z1231 Encounter for screening mammogram for malignant neoplasm of breast: Secondary | ICD-10-CM | POA: Diagnosis not present

## 2018-04-22 LAB — HM MAMMOGRAPHY

## 2018-04-27 ENCOUNTER — Encounter: Payer: Self-pay | Admitting: Internal Medicine

## 2018-04-27 DIAGNOSIS — R928 Other abnormal and inconclusive findings on diagnostic imaging of breast: Secondary | ICD-10-CM | POA: Diagnosis not present

## 2018-04-27 LAB — HM MAMMOGRAPHY

## 2018-05-04 ENCOUNTER — Encounter: Payer: Self-pay | Admitting: Internal Medicine

## 2018-05-17 ENCOUNTER — Encounter (HOSPITAL_COMMUNITY): Payer: Self-pay

## 2018-05-17 NOTE — Patient Instructions (Signed)
Hannah [name]  05/17/2018   Your procedure is scheduled on: 06-04-18  Report to [facility] Main  Entrance  Report to admitting at    1000   AM    Call this number if you have problems the morning of surgery [phone]    Remember: Do not eat food or drink liquids :After Midnight.   BRUSH YOUR TEETH MORNING OF SURGERY AND RINSE YOUR MOUTH OUT, NO CHEWING GUM CANDY OR MINTS.     Take these medicines the morning of surgery with A SIP OF WATER: omeprazole, fenofibrate                                You may not have any metal on your body including hair pins and              piercings  Do not wear jewelry, make-up, lotions, powders or perfumes, deodorant             Do not wear nail polish.  Do not shave  48 hours prior to surgery.           Do not bring valuables to the hospital. [place].  Contacts, dentures or bridgework may not be worn into surgery.  Leave suitcase in the car. After surgery it may be brought to your room.                  Please read over the following fact sheets you were given: ____________________________________________________________________            [facility] - Preparing for Surgery Before surgery, you can play an important role.  Because skin is not sterile, your skin needs to be as free of germs as possible.  You can reduce the number of germs on your skin by washing with CHG (chlorahexidine gluconate) soap before surgery.  CHG is an antiseptic cleaner which kills germs and bonds with the skin to continue killing germs even after washing. Please DO NOT use if you have an allergy to CHG or antibacterial soaps.  If your skin becomes reddened/irritated stop using the CHG and inform your nurse when you arrive at Short Stay. Do not shave (including legs and underarms) for at least 48 hours prior to the first CHG shower.  You may shave your face/neck. Please follow these  instructions carefully:  1.  Shower with CHG Soap the night before surgery and the  morning of Surgery.  2.  If you choose to wash your hair, wash your hair first as usual with your  normal  shampoo.  3.  After you shampoo, rinse your hair and body thoroughly to remove the  shampoo.                           4.  Use CHG as you would any other liquid soap.  You can apply chg directly  to the skin and wash                       Gently with a scrungie or clean washcloth.  5.  Apply the CHG Soap to your body ONLY FROM THE NECK DOWN.   Do  not use on face/ open                           Wound or open sores. Avoid contact with eyes, ears mouth and genitals (private parts).                       Wash face,  Genitals (private parts) with your normal soap.             6.  Wash thoroughly, paying special attention to the area where your surgery  will be performed.  7.  Thoroughly rinse your body with warm water from the neck down.  8.  DO NOT shower/wash with your normal soap after using and rinsing off  the CHG Soap.                9.  Pat yourself dry with a clean towel.            10.  Wear clean pajamas.            11.  Place clean sheets on your bed the night of your first shower and do not  sleep with pets. Day of Surgery : Do not apply any lotions/deodorants the morning of surgery.  Please wear clean clothes to the hospital/surgery center.  FAILURE TO FOLLOW THESE INSTRUCTIONS MAY RESULT IN THE CANCELLATION OF YOUR SURGERY PATIENT SIGNATURE_________________________________  NURSE SIGNATURE__________________________________  ________________________________________________________________________  WHAT IS A BLOOD TRANSFUSION? Blood Transfusion Information  A transfusion is the replacement of blood or some of its parts. Blood is made up of multiple cells which provide different functions.  Red blood cells carry oxygen and are used for blood loss replacement.  White blood cells fight against  infection.  Platelets control bleeding.  Plasma helps clot blood.  Other blood products are available for specialized needs, such as hemophilia or other clotting disorders. BEFORE THE TRANSFUSION  Who gives blood for transfusions?   Healthy volunteers who are fully evaluated to make sure their blood is safe. This is blood bank blood. Transfusion therapy is the safest it has ever been in the practice of medicine. Before blood is taken from a donor, a complete history is taken to make sure that person has no history of diseases nor engages in risky social behavior (examples are intravenous drug use or sexual activity with multiple partners). The donor's travel history is screened to minimize risk of transmitting infections, such as malaria. The donated blood is tested for signs of infectious diseases, such as HIV and hepatitis. The blood is then tested to be sure it is compatible with you in order to minimize the chance of a transfusion reaction. If you or a relative donates blood, this is often done in anticipation of surgery and is not appropriate for emergency situations. It takes many days to process the donated blood. RISKS AND COMPLICATIONS Although transfusion therapy is very safe and saves many lives, the main dangers of transfusion include:   Getting an infectious disease.  Developing a transfusion reaction. This is an allergic reaction to something in the blood you were given. Every precaution is taken to prevent this. The decision to have a blood transfusion has been considered carefully by your caregiver before blood is given. Blood is not given unless the benefits outweigh the risks. AFTER THE TRANSFUSION  Right after receiving a blood transfusion, you will usually feel much better and more energetic. This is especially true  if your red blood cells have gotten low (anemic). The transfusion raises the level of the red blood cells which carry oxygen, and this usually causes an energy  increase.  The nurse administering the transfusion will monitor you carefully for complications. HOME CARE INSTRUCTIONS  No special instructions are needed after a transfusion. You may find your energy is better. Speak with your caregiver about any limitations on activity for underlying diseases you may have. SEEK MEDICAL CARE IF:   Your condition is not improving after your transfusion.  You develop redness or irritation at the intravenous (IV) site. SEEK IMMEDIATE MEDICAL CARE IF:  Any of the following symptoms occur over the next 12 hours:  Shaking chills.  You have a temperature by mouth above 102 F (38.9 C), not controlled by medicine.  Chest, back, or muscle pain.  People around you feel you are not acting correctly or are confused.  Shortness of breath or difficulty breathing.  Dizziness and fainting.  You get a rash or develop hives.  You have a decrease in urine output.  Your urine turns a dark color or changes to pink, red, or brown. Any of the following symptoms occur over the next 10 days:  You have a temperature by mouth above 102 F (38.9 C), not controlled by medicine.  Shortness of breath.  Weakness after normal activity.  The white part of the eye turns yellow (jaundice).  You have a decrease in the amount of urine or are urinating less often.  Your urine turns a dark color or changes to pink, red, or brown. Document Released: 06/13/2000 Document Revised: 09/08/2011 Document Reviewed: 01/31/2008 ExitCare Patient Information 2014 [place].  _______________________________________________________________________  Incentive Spirometer  An incentive spirometer is a tool that can help keep your lungs clear and active. This tool measures how well you are filling your lungs with each breath. Taking long deep breaths may help reverse or decrease the chance of developing breathing (pulmonary) problems (especially infection) following:  A long  period of time when you are unable to move or be active. BEFORE THE PROCEDURE   If the spirometer includes an indicator to show your best effort, your nurse or respiratory therapist will set it to a desired goal.  If possible, sit up straight or lean slightly forward. Try not to slouch.  Hold the incentive spirometer in an upright position. INSTRUCTIONS FOR USE  1. Sit on the edge of your bed if possible, or sit up as far as you can in bed or on a chair. 2. Hold the incentive spirometer in an upright position. 3. Breathe out normally. 4. Place the mouthpiece in your mouth and seal your lips tightly around it. 5. Breathe in slowly and as deeply as possible, raising the piston or the ball toward the top of the column. 6. Hold your breath for 3-5 seconds or for as long as possible. Allow the piston or ball to fall to the bottom of the column. 7. Remove the mouthpiece from your mouth and breathe out normally. 8. Rest for a few seconds and repeat Steps 1 through 7 at least 10 times every 1-2 hours when you are awake. Take your time and take a few normal breaths between deep breaths. 9. The spirometer may include an indicator to show your best effort. Use the indicator as a goal to work toward during each repetition. 10. After each set of 10 deep breaths, practice coughing to be sure your lungs are clear. If you have an  incision (the cut made at the time of surgery), support your incision when coughing by placing a pillow or rolled up towels firmly against it. Once you are able to get out of bed, walk around indoors and cough well. You may stop using the incentive spirometer when instructed by your caregiver.  RISKS AND COMPLICATIONS  Take your time so you do not get dizzy or light-headed.  If you are in pain, you may need to take or ask for pain medication before doing incentive spirometry. It is harder to take a deep breath if you are having pain. AFTER USE  Rest and breathe slowly and  easily.  It can be helpful to keep track of a log of your progress. Your caregiver can provide you with a simple table to help with this. If you are using the spirometer at home, follow these instructions: [place] IF:   You are having difficultly using the spirometer.  You have trouble using the spirometer as often as instructed.  Your pain medication is not giving enough relief while using the spirometer.  You develop fever of 100.5 F (38.1 C) or higher. SEEK IMMEDIATE MEDICAL CARE IF:   You cough up bloody sputum that had not been present before.  You develop fever of 102 F (38.9 C) or greater.  You develop worsening pain at or near the incision site. MAKE SURE YOU:   Understand these instructions.  Will watch your condition.  Will get help right away if you are not doing well or get worse. Document Released: 10/27/2006 Document Revised: 09/08/2011 Document Reviewed: 12/28/2006 [facility] Patient Information 2014 [place], [place].   ________________________________________________________________________

## 2018-05-17 NOTE — Progress Notes (Signed)
Clearance 04-14-18 epic  Dr. Jenkins Rouge cards  ekg 09-24-17 epic

## 2018-05-18 ENCOUNTER — Encounter (HOSPITAL_COMMUNITY)
Admission: RE | Admit: 2018-05-18 | Discharge: 2018-05-18 | Disposition: A | Discharge status: Home / Self Care | Payer: 59 | Source: Ambulatory Visit | Attending: Orthopedic Surgery | Admitting: Orthopedic Surgery

## 2018-05-18 ENCOUNTER — Other Ambulatory Visit: Payer: Self-pay

## 2018-05-18 ENCOUNTER — Encounter (HOSPITAL_COMMUNITY): Payer: Self-pay

## 2018-05-18 ENCOUNTER — Ambulatory Visit (HOSPITAL_COMMUNITY)
Admission: RE | Admit: 2018-05-18 | Discharge: 2018-05-18 | Disposition: A | Discharge status: Home / Self Care | Payer: 59 | Source: Ambulatory Visit | Attending: Orthopedic Surgery | Admitting: Orthopedic Surgery

## 2018-05-18 DIAGNOSIS — Z01818 Encounter for other preprocedural examination: Secondary | ICD-10-CM | POA: Diagnosis not present

## 2018-05-18 DIAGNOSIS — M1711 Unilateral primary osteoarthritis, right knee: Secondary | ICD-10-CM | POA: Diagnosis not present

## 2018-05-18 DIAGNOSIS — J9811 Atelectasis: Secondary | ICD-10-CM | POA: Insufficient documentation

## 2018-05-18 DIAGNOSIS — I10 Essential (primary) hypertension: Secondary | ICD-10-CM | POA: Diagnosis not present

## 2018-05-18 DIAGNOSIS — I517 Cardiomegaly: Secondary | ICD-10-CM | POA: Insufficient documentation

## 2018-05-18 HISTORY — DX: Prediabetes: R73.03

## 2018-05-18 LAB — CBC WITH DIFFERENTIAL/PLATELET
Abs Immature Granulocytes: 0.02 10*3/uL (ref 0.00–0.07)
Basophils Absolute: 0 10*3/uL (ref 0.0–0.1)
Basophils Relative: 1 %
Eosinophils Absolute: 0.2 10*3/uL (ref 0.0–0.5)
Eosinophils Relative: 3 %
HCT: 36.1 % (ref 36.0–46.0)
Hemoglobin: 10.9 g/dL — ABNORMAL LOW (ref 12.0–15.0)
Immature Granulocytes: 0 %
Lymphocytes Relative: 43 %
Lymphs Abs: 2.8 10*3/uL (ref 0.7–4.0)
MCH: 26.8 pg (ref 26.0–34.0)
MCHC: 30.2 g/dL (ref 30.0–36.0)
MCV: 88.9 fL (ref 80.0–100.0)
Monocytes Absolute: 0.4 10*3/uL (ref 0.1–1.0)
Monocytes Relative: 6 %
Neutro Abs: 3.1 10*3/uL (ref 1.7–7.7)
Neutrophils Relative %: 47 %
Platelets: 223 10*3/uL (ref 150–400)
RBC: 4.06 MIL/uL (ref 3.87–5.11)
RDW: 12.6 % (ref 11.5–15.5)
WBC: 6.6 10*3/uL (ref 4.0–10.5)
nRBC: 0 % (ref 0.0–0.2)

## 2018-05-18 LAB — COMPREHENSIVE METABOLIC PANEL
ALT: 23 U/L (ref 0–44)
AST: 38 U/L (ref 15–41)
Albumin: 4.5 g/dL (ref 3.5–5.0)
Alkaline Phosphatase: 34 U/L — ABNORMAL LOW (ref 38–126)
Anion gap: 8 (ref 5–15)
BUN: 7 mg/dL — ABNORMAL LOW (ref 8–23)
CO2: 29 mmol/L (ref 22–32)
Calcium: 9.8 mg/dL (ref 8.9–10.3)
Chloride: 105 mmol/L (ref 98–111)
Creatinine, Ser: 0.8 mg/dL (ref 0.44–1.00)
GFR calc Af Amer: >60 mL/min (ref >60)
GFR calc non Af Amer: >60 mL/min (ref >60)
Glucose, Bld: 100 mg/dL — ABNORMAL HIGH (ref 70–99)
Potassium: 3.4 mmol/L — ABNORMAL LOW (ref 3.5–5.1)
Sodium: 142 mmol/L (ref 135–145)
Total Bilirubin: 0.7 mg/dL (ref 0.3–1.2)
Total Protein: 7.2 g/dL (ref 6.5–8.1)

## 2018-05-18 LAB — URINALYSIS, ROUTINE W REFLEX MICROSCOPIC
Bilirubin Urine: NEGATIVE
Glucose, UA: NEGATIVE mg/dL
Hgb urine dipstick: NEGATIVE
Ketones, ur: NEGATIVE mg/dL
Leukocytes, UA: NEGATIVE
Nitrite: NEGATIVE
Protein, ur: NEGATIVE mg/dL
Specific Gravity, Urine: 1.006 (ref 1.005–1.030)
pH: 5 (ref 5.0–8.0)

## 2018-05-18 LAB — SURGICAL PCR SCREEN
MRSA, PCR: NEGATIVE
Staphylococcus aureus: NEGATIVE

## 2018-05-18 LAB — HEMOGLOBIN A1C
Hgb A1c MFr Bld: 5.9 % — ABNORMAL HIGH (ref 4.8–5.6)
Mean Plasma Glucose: 122.63 mg/dL

## 2018-05-18 LAB — PROTIME-INR
INR: 0.95
Prothrombin Time: 12.6 seconds (ref 11.4–15.2)

## 2018-05-18 LAB — APTT: aPTT: 36 seconds (ref 24–36)

## 2018-05-18 NOTE — Progress Notes (Signed)
cxr  Done 05-18-18 routed to Dr. Berenice Primas via epic.

## 2018-06-04 ENCOUNTER — Ambulatory Visit (HOSPITAL_COMMUNITY): Payer: 59 | Admitting: Registered Nurse

## 2018-06-04 ENCOUNTER — Encounter (HOSPITAL_COMMUNITY): Payer: Self-pay | Admitting: *Deleted

## 2018-06-04 ENCOUNTER — Other Ambulatory Visit: Payer: Self-pay

## 2018-06-04 ENCOUNTER — Encounter (HOSPITAL_COMMUNITY)
Admission: RE | Disposition: A | Discharge status: Home / Self Care | Payer: Self-pay | Source: Ambulatory Visit | Attending: Orthopedic Surgery

## 2018-06-04 ENCOUNTER — Ambulatory Visit (HOSPITAL_COMMUNITY)
Admission: RE | Admit: 2018-06-04 | Discharge: 2018-06-05 | Disposition: A | Discharge status: Home / Self Care | Payer: 59 | Source: Ambulatory Visit | Attending: Orthopedic Surgery | Admitting: Orthopedic Surgery

## 2018-06-04 DIAGNOSIS — F17210 Nicotine dependence, cigarettes, uncomplicated: Secondary | ICD-10-CM | POA: Diagnosis not present

## 2018-06-04 DIAGNOSIS — E785 Hyperlipidemia, unspecified: Secondary | ICD-10-CM | POA: Insufficient documentation

## 2018-06-04 DIAGNOSIS — G8918 Other acute postprocedural pain: Secondary | ICD-10-CM | POA: Diagnosis not present

## 2018-06-04 DIAGNOSIS — I10 Essential (primary) hypertension: Secondary | ICD-10-CM | POA: Diagnosis not present

## 2018-06-04 DIAGNOSIS — M1711 Unilateral primary osteoarthritis, right knee: Secondary | ICD-10-CM | POA: Diagnosis not present

## 2018-06-04 DIAGNOSIS — I739 Peripheral vascular disease, unspecified: Secondary | ICD-10-CM | POA: Insufficient documentation

## 2018-06-04 DIAGNOSIS — Z79899 Other long term (current) drug therapy: Secondary | ICD-10-CM | POA: Diagnosis not present

## 2018-06-04 DIAGNOSIS — F419 Anxiety disorder, unspecified: Secondary | ICD-10-CM | POA: Diagnosis not present

## 2018-06-04 DIAGNOSIS — K219 Gastro-esophageal reflux disease without esophagitis: Secondary | ICD-10-CM | POA: Insufficient documentation

## 2018-06-04 DIAGNOSIS — M25561 Pain in right knee: Secondary | ICD-10-CM | POA: Diagnosis present

## 2018-06-04 DIAGNOSIS — Z7982 Long term (current) use of aspirin: Secondary | ICD-10-CM | POA: Diagnosis not present

## 2018-06-04 DIAGNOSIS — Z96652 Presence of left artificial knee joint: Secondary | ICD-10-CM | POA: Insufficient documentation

## 2018-06-04 DIAGNOSIS — Z79890 Hormone replacement therapy (postmenopausal): Secondary | ICD-10-CM | POA: Diagnosis not present

## 2018-06-04 HISTORY — PX: TOTAL KNEE ARTHROPLASTY: SHX125

## 2018-06-04 LAB — TYPE AND SCREEN
ABO/RH(D): B POS
Antibody Screen: NEGATIVE

## 2018-06-04 LAB — ABO/RH: ABO/RH(D): B POS

## 2018-06-04 SURGERY — ARTHROPLASTY, KNEE, TOTAL
Anesthesia: General | Site: Knee | Laterality: Right

## 2018-06-04 MED ORDER — SODIUM CHLORIDE 0.9 % IV SOLN
INTRAVENOUS | Status: DC
  Administered 2018-06-04 (×2): via INTRAVENOUS

## 2018-06-04 MED ORDER — FENTANYL CITRATE (PF) 100 MCG/2ML IJ SOLN
INTRAMUSCULAR | Status: DC | PRN
  Administered 2018-06-04: 50 ug via INTRAVENOUS
  Administered 2018-06-04: 100 ug via INTRAVENOUS
  Administered 2018-06-04: 50 ug via INTRAVENOUS

## 2018-06-04 MED ORDER — METHOCARBAMOL 500 MG IVPB - SIMPLE MED
INTRAVENOUS | Status: AC
  Administered 2018-06-04: 500 mg via INTRAVENOUS
  Filled 2018-06-04: qty 50

## 2018-06-04 MED ORDER — SODIUM CHLORIDE 0.9% FLUSH
INTRAVENOUS | Status: DC | PRN
  Administered 2018-06-04: 50 mL

## 2018-06-04 MED ORDER — BUPIVACAINE-EPINEPHRINE (PF) 0.25% -1:200000 IJ SOLN
INTRAMUSCULAR | Status: AC
  Filled 2018-06-04: qty 30

## 2018-06-04 MED ORDER — TIZANIDINE HCL 2 MG PO TABS: 2.0000 mg | ORAL_TABLET | Freq: Three times a day (TID) | ORAL | 0 refills | Status: DC | PRN

## 2018-06-04 MED ORDER — MIDAZOLAM HCL 5 MG/5ML IJ SOLN
INTRAMUSCULAR | Status: DC | PRN
  Administered 2018-06-04: 2 mg via INTRAVENOUS

## 2018-06-04 MED ORDER — ALPRAZOLAM 0.5 MG PO TABS
0.5000 mg | ORAL_TABLET | Freq: Two times a day (BID) | ORAL | Status: DC | PRN
  Administered 2018-06-05: 0.5 mg via ORAL
  Filled 2018-06-04: qty 1

## 2018-06-04 MED ORDER — PHENYLEPHRINE 40 MCG/ML (10ML) SYRINGE FOR IV PUSH (FOR BLOOD PRESSURE SUPPORT)
PREFILLED_SYRINGE | INTRAVENOUS | Status: AC
  Filled 2018-06-04: qty 10

## 2018-06-04 MED ORDER — ONDANSETRON HCL 4 MG/2ML IJ SOLN
INTRAMUSCULAR | Status: AC
  Filled 2018-06-04: qty 2

## 2018-06-04 MED ORDER — HYDRALAZINE HCL 20 MG/ML IJ SOLN
INTRAMUSCULAR | Status: DC | PRN
  Administered 2018-06-04: 5 mg via INTRAVENOUS

## 2018-06-04 MED ORDER — ROPIVACAINE HCL 7.5 MG/ML IJ SOLN
INTRAMUSCULAR | Status: DC | PRN
  Administered 2018-06-04: 25 mL via PERINEURAL

## 2018-06-04 MED ORDER — MORPHINE SULFATE (PF) 2 MG/ML IV SOLN: 0.5000 mg | INTRAVENOUS | Status: DC | PRN

## 2018-06-04 MED ORDER — DEXAMETHASONE SODIUM PHOSPHATE 10 MG/ML IJ SOLN
10.0000 mg | Freq: Two times a day (BID) | INTRAMUSCULAR | Status: DC
  Administered 2018-06-04 – 2018-06-05 (×2): 10 mg via INTRAVENOUS
  Filled 2018-06-04 (×2): qty 1

## 2018-06-04 MED ORDER — FENTANYL CITRATE (PF) 100 MCG/2ML IJ SOLN
INTRAMUSCULAR | Status: AC
  Filled 2018-06-04: qty 2

## 2018-06-04 MED ORDER — ALUM & MAG HYDROXIDE-SIMETH 200-200-20 MG/5ML PO SUSP: 30.0000 mL | ORAL | Status: DC | PRN

## 2018-06-04 MED ORDER — LACTATED RINGERS IV SOLN
INTRAVENOUS | Status: DC
  Administered 2018-06-04 (×3): via INTRAVENOUS

## 2018-06-04 MED ORDER — SODIUM CHLORIDE 0.9 % IV SOLN
INTRAVENOUS | Status: DC | PRN
  Administered 2018-06-04: 25 ug/min via INTRAVENOUS

## 2018-06-04 MED ORDER — HYDROCODONE-ACETAMINOPHEN 5-325 MG PO TABS: 1.0000 | ORAL_TABLET | Freq: Four times a day (QID) | ORAL | 0 refills | Status: DC | PRN

## 2018-06-04 MED ORDER — PROPOFOL 10 MG/ML IV BOLUS
INTRAVENOUS | Status: DC | PRN
  Administered 2018-06-04: 110 mg via INTRAVENOUS

## 2018-06-04 MED ORDER — SODIUM CHLORIDE (PF) 0.9 % IJ SOLN
INTRAMUSCULAR | Status: AC
  Filled 2018-06-04: qty 50

## 2018-06-04 MED ORDER — SODIUM CHLORIDE 0.9 % IR SOLN
Status: DC | PRN
  Administered 2018-06-04: 1000 mL

## 2018-06-04 MED ORDER — HYDROCODONE-ACETAMINOPHEN 5-325 MG PO TABS
1.0000 | ORAL_TABLET | ORAL | Status: DC | PRN
  Administered 2018-06-04 (×2): 1 via ORAL
  Administered 2018-06-05: 2 via ORAL
  Administered 2018-06-05: 1 via ORAL
  Filled 2018-06-04 (×2): qty 1
  Filled 2018-06-04: qty 2
  Filled 2018-06-04: qty 1

## 2018-06-04 MED ORDER — TRANEXAMIC ACID-NACL 1000-0.7 MG/100ML-% IV SOLN
1000.0000 mg | Freq: Once | INTRAVENOUS | Status: AC
  Administered 2018-06-04: 1000 mg via INTRAVENOUS
  Filled 2018-06-04: qty 100

## 2018-06-04 MED ORDER — ASPIRIN EC 325 MG PO TBEC
325.0000 mg | DELAYED_RELEASE_TABLET | Freq: Two times a day (BID) | ORAL | Status: DC
  Administered 2018-06-04 – 2018-06-05 (×2): 325 mg via ORAL
  Filled 2018-06-04 (×2): qty 1

## 2018-06-04 MED ORDER — CEFAZOLIN SODIUM-DEXTROSE 2-4 GM/100ML-% IV SOLN
2.0000 g | INTRAVENOUS | Status: AC
  Administered 2018-06-04: 2 g via INTRAVENOUS
  Filled 2018-06-04: qty 100

## 2018-06-04 MED ORDER — CEFAZOLIN SODIUM-DEXTROSE 2-4 GM/100ML-% IV SOLN
2.0000 g | Freq: Four times a day (QID) | INTRAVENOUS | Status: AC
  Administered 2018-06-04 – 2018-06-05 (×2): 2 g via INTRAVENOUS
  Filled 2018-06-04 (×2): qty 100

## 2018-06-04 MED ORDER — FENTANYL CITRATE (PF) 100 MCG/2ML IJ SOLN
INTRAMUSCULAR | Status: AC
  Administered 2018-06-04: 25 ug via INTRAVENOUS
  Filled 2018-06-04: qty 4

## 2018-06-04 MED ORDER — PHENYLEPHRINE HCL 10 MG/ML IJ SOLN
INTRAMUSCULAR | Status: AC
  Filled 2018-06-04: qty 2

## 2018-06-04 MED ORDER — CHLORHEXIDINE GLUCONATE 4 % EX LIQD: 60.0000 mL | Freq: Once | CUTANEOUS | Status: DC

## 2018-06-04 MED ORDER — BUPIVACAINE-EPINEPHRINE (PF) 0.25% -1:200000 IJ SOLN
INTRAMUSCULAR | Status: DC | PRN
  Administered 2018-06-04: 30 mL

## 2018-06-04 MED ORDER — TRANEXAMIC ACID-NACL 1000-0.7 MG/100ML-% IV SOLN
1000.0000 mg | INTRAVENOUS | Status: AC
  Administered 2018-06-04: 1000 mg via INTRAVENOUS
  Filled 2018-06-04 (×2): qty 100

## 2018-06-04 MED ORDER — ONDANSETRON HCL 4 MG/2ML IJ SOLN
INTRAMUSCULAR | Status: DC | PRN
  Administered 2018-06-04: 4 mg via INTRAVENOUS

## 2018-06-04 MED ORDER — GABAPENTIN 300 MG PO CAPS
300.0000 mg | ORAL_CAPSULE | Freq: Two times a day (BID) | ORAL | Status: DC
  Administered 2018-06-04 – 2018-06-05 (×2): 300 mg via ORAL
  Filled 2018-06-04 (×2): qty 1

## 2018-06-04 MED ORDER — FENOFIBRATE 160 MG PO TABS
160.0000 mg | ORAL_TABLET | Freq: Every day | ORAL | Status: DC
  Administered 2018-06-05: 160 mg via ORAL
  Filled 2018-06-04 (×2): qty 1

## 2018-06-04 MED ORDER — BISACODYL 5 MG PO TBEC: 5.0000 mg | DELAYED_RELEASE_TABLET | Freq: Every day | ORAL | Status: DC | PRN

## 2018-06-04 MED ORDER — POLYETHYLENE GLYCOL 3350 17 G PO PACK: 17.0000 g | PACK | Freq: Every day | ORAL | Status: DC | PRN

## 2018-06-04 MED ORDER — LIDOCAINE HCL (CARDIAC) PF 100 MG/5ML IV SOSY
PREFILLED_SYRINGE | INTRAVENOUS | Status: DC | PRN
  Administered 2018-06-04: 75 mg via INTRAVENOUS

## 2018-06-04 MED ORDER — DOCUSATE SODIUM 100 MG PO CAPS
100.0000 mg | ORAL_CAPSULE | Freq: Two times a day (BID) | ORAL | Status: DC
  Administered 2018-06-04 – 2018-06-05 (×2): 100 mg via ORAL
  Filled 2018-06-04 (×3): qty 1

## 2018-06-04 MED ORDER — WATER FOR IRRIGATION, STERILE IR SOLN
Status: DC | PRN
  Administered 2018-06-04: 2000 mL

## 2018-06-04 MED ORDER — METHOCARBAMOL 500 MG PO TABS
500.0000 mg | ORAL_TABLET | Freq: Four times a day (QID) | ORAL | Status: DC | PRN
  Administered 2018-06-05: 500 mg via ORAL
  Filled 2018-06-04: qty 1

## 2018-06-04 MED ORDER — PROPOFOL 10 MG/ML IV BOLUS
INTRAVENOUS | Status: AC
  Filled 2018-06-04: qty 40

## 2018-06-04 MED ORDER — MAGNESIUM CITRATE PO SOLN: 1.0000 | Freq: Once | ORAL | Status: DC | PRN

## 2018-06-04 MED ORDER — MIDAZOLAM HCL 2 MG/2ML IJ SOLN
1.0000 mg | INTRAMUSCULAR | Status: DC
  Administered 2018-06-04: 1 mg via INTRAVENOUS
  Filled 2018-06-04: qty 2

## 2018-06-04 MED ORDER — DIPHENHYDRAMINE HCL 12.5 MG/5ML PO ELIX: 12.5000 mg | ORAL_SOLUTION | ORAL | Status: DC | PRN

## 2018-06-04 MED ORDER — ESTRADIOL 0.5 MG PO TABS
0.2500 mg | ORAL_TABLET | Freq: Every day | ORAL | Status: DC
  Filled 2018-06-04: qty 1

## 2018-06-04 MED ORDER — BISOPROLOL-HYDROCHLOROTHIAZIDE 5-6.25 MG PO TABS
0.5000 | ORAL_TABLET | Freq: Every day | ORAL | Status: DC
  Administered 2018-06-04 – 2018-06-05 (×2): 0.5 via ORAL
  Filled 2018-06-04 (×2): qty 0.5

## 2018-06-04 MED ORDER — FENTANYL CITRATE (PF) 100 MCG/2ML IJ SOLN
50.0000 ug | INTRAMUSCULAR | Status: DC
  Administered 2018-06-04: 100 ug via INTRAVENOUS
  Filled 2018-06-04: qty 2

## 2018-06-04 MED ORDER — MIDAZOLAM HCL 2 MG/2ML IJ SOLN
INTRAMUSCULAR | Status: AC
  Filled 2018-06-04: qty 2

## 2018-06-04 MED ORDER — BUPIVACAINE LIPOSOME 1.3 % IJ SUSP
20.0000 mL | Freq: Once | INTRAMUSCULAR | Status: AC
  Administered 2018-06-04: 20 mL
  Filled 2018-06-04: qty 20

## 2018-06-04 MED ORDER — MEPERIDINE HCL 50 MG/ML IJ SOLN: 6.2500 mg | INTRAMUSCULAR | Status: DC | PRN

## 2018-06-04 MED ORDER — ONDANSETRON HCL 4 MG PO TABS: 4.0000 mg | ORAL_TABLET | Freq: Four times a day (QID) | ORAL | Status: DC | PRN

## 2018-06-04 MED ORDER — METHOCARBAMOL 500 MG IVPB - SIMPLE MED
500.0000 mg | Freq: Four times a day (QID) | INTRAVENOUS | Status: DC | PRN
  Administered 2018-06-04: 500 mg via INTRAVENOUS
  Filled 2018-06-04: qty 50

## 2018-06-04 MED ORDER — DOCUSATE SODIUM 100 MG PO CAPS: 100.0000 mg | ORAL_CAPSULE | Freq: Two times a day (BID) | ORAL | 0 refills | Status: DC

## 2018-06-04 MED ORDER — ONDANSETRON HCL 4 MG/2ML IJ SOLN: 4.0000 mg | Freq: Four times a day (QID) | INTRAMUSCULAR | Status: DC | PRN

## 2018-06-04 MED ORDER — ASPIRIN EC 325 MG PO TBEC: 325.0000 mg | DELAYED_RELEASE_TABLET | Freq: Two times a day (BID) | ORAL | 0 refills | Status: DC

## 2018-06-04 MED ORDER — PANTOPRAZOLE SODIUM 40 MG PO TBEC
40.0000 mg | DELAYED_RELEASE_TABLET | Freq: Two times a day (BID) | ORAL | Status: DC
  Administered 2018-06-04 – 2018-06-05 (×2): 40 mg via ORAL
  Filled 2018-06-04 (×2): qty 1

## 2018-06-04 MED ORDER — ESTRADIOL 1 MG PO TABS
0.5000 mg | ORAL_TABLET | Freq: Every day | ORAL | Status: DC
  Administered 2018-06-04 – 2018-06-05 (×2): 0.5 mg via ORAL
  Filled 2018-06-04 (×2): qty 0.5

## 2018-06-04 MED ORDER — PHENYLEPHRINE HCL 10 MG/ML IJ SOLN
INTRAMUSCULAR | Status: AC
  Filled 2018-06-04: qty 1

## 2018-06-04 MED ORDER — CLONIDINE HCL (ANALGESIA) 100 MCG/ML EP SOLN
EPIDURAL | Status: DC | PRN
  Administered 2018-06-04: 100 ug

## 2018-06-04 MED ORDER — FENTANYL CITRATE (PF) 250 MCG/5ML IJ SOLN
INTRAMUSCULAR | Status: AC
  Filled 2018-06-04: qty 5

## 2018-06-04 MED ORDER — FENTANYL CITRATE (PF) 100 MCG/2ML IJ SOLN
25.0000 ug | INTRAMUSCULAR | Status: DC | PRN
  Administered 2018-06-04 (×3): 25 ug via INTRAVENOUS

## 2018-06-04 MED ORDER — PROPOFOL 10 MG/ML IV BOLUS
INTRAVENOUS | Status: AC
  Filled 2018-06-04: qty 20

## 2018-06-04 MED ORDER — PRAVASTATIN SODIUM 20 MG PO TABS
20.0000 mg | ORAL_TABLET | Freq: Every evening | ORAL | Status: DC
  Administered 2018-06-04: 20 mg via ORAL
  Filled 2018-06-04: qty 1

## 2018-06-04 MED ORDER — ACETAMINOPHEN 325 MG PO TABS: 325.0000 mg | ORAL_TABLET | Freq: Four times a day (QID) | ORAL | Status: DC | PRN

## 2018-06-04 MED ORDER — DEXAMETHASONE SODIUM PHOSPHATE 10 MG/ML IJ SOLN
INTRAMUSCULAR | Status: AC
  Filled 2018-06-04: qty 1

## 2018-06-04 SURGICAL SUPPLY — 57 items
ATTUNE PS FEM RT SZ 4 CEM KNEE (Femur) ×2 IMPLANT
ATTUNE PSRP INSR SZ4 6 KNEE (Insert) ×2 IMPLANT
BAG ZIPLOCK 12X15 (MISCELLANEOUS) ×2 IMPLANT
BANDAGE ACE 6X5 VEL STRL LF (GAUZE/BANDAGES/DRESSINGS) ×2 IMPLANT
BASE TIBIAL ROT PLAT SZ 5 KNEE (Knees) ×1 IMPLANT
BENZOIN TINCTURE PRP APPL 2/3 (GAUZE/BANDAGES/DRESSINGS) ×2 IMPLANT
BLADE SAGITTAL 25.0X1.19X90 (BLADE) ×2 IMPLANT
BLADE SAW SGTL 11.0X1.19X90.0M (BLADE) ×2 IMPLANT
BLADE SURG SZ10 CARB STEEL (BLADE) ×4 IMPLANT
BOOTIES KNEE HIGH SLOAN (MISCELLANEOUS) ×2 IMPLANT
BOWL SMART MIX CTS (DISPOSABLE) ×2 IMPLANT
CEMENT HV SMART SET (Cement) ×4 IMPLANT
COVER WAND RF STERILE (DRAPES) ×2 IMPLANT
CUFF TOURN SGL QUICK 34 (TOURNIQUET CUFF) ×1
CUFF TRNQT CYL 34X4X40X1 (TOURNIQUET CUFF) ×1 IMPLANT
DECANTER SPIKE VIAL GLASS SM (MISCELLANEOUS) ×2 IMPLANT
DRAPE U-SHAPE 47X51 STRL (DRAPES) ×2 IMPLANT
DRESSING AQUACEL AG SP 3.5X10 (GAUZE/BANDAGES/DRESSINGS) ×1 IMPLANT
DRSG AQUACEL AG SP 3.5X10 (GAUZE/BANDAGES/DRESSINGS) ×2
DURAPREP 26ML APPLICATOR (WOUND CARE) ×2 IMPLANT
ELECT REM PT RETURN 15FT ADLT (MISCELLANEOUS) ×2 IMPLANT
GLOVE BIOGEL PI IND STRL 6.5 (GLOVE) ×1 IMPLANT
GLOVE BIOGEL PI IND STRL 7.0 (GLOVE) ×2 IMPLANT
GLOVE BIOGEL PI IND STRL 7.5 (GLOVE) ×1 IMPLANT
GLOVE BIOGEL PI IND STRL 8 (GLOVE) ×2 IMPLANT
GLOVE BIOGEL PI INDICATOR 6.5 (GLOVE) ×1
GLOVE BIOGEL PI INDICATOR 7.0 (GLOVE) ×2
GLOVE BIOGEL PI INDICATOR 7.5 (GLOVE) ×1
GLOVE BIOGEL PI INDICATOR 8 (GLOVE) ×2
GLOVE ECLIPSE 7.5 STRL STRAW (GLOVE) ×4 IMPLANT
GLOVE SURG SS PI 6.5 STRL IVOR (GLOVE) ×2 IMPLANT
GOWN STRL REUS W/TWL LRG LVL3 (GOWN DISPOSABLE) ×4 IMPLANT
GOWN STRL REUS W/TWL XL LVL3 (GOWN DISPOSABLE) ×4 IMPLANT
HANDPIECE INTERPULSE COAX TIP (DISPOSABLE) ×1
HOOD PEEL AWAY FLYTE STAYCOOL (MISCELLANEOUS) ×6 IMPLANT
IMMOBILIZER KNEE 20 (SOFTGOODS) ×2
IMMOBILIZER KNEE 20 THIGH 36 (SOFTGOODS) ×1 IMPLANT
MANIFOLD NEPTUNE II (INSTRUMENTS) ×2 IMPLANT
NEEDLE HYPO 22GX1.5 SAFETY (NEEDLE) ×2 IMPLANT
PACK ICE MAXI GEL EZY WRAP (MISCELLANEOUS) ×2 IMPLANT
PACK TOTAL KNEE CUSTOM (KITS) ×2 IMPLANT
PADDING CAST COTTON 6X4 STRL (CAST SUPPLIES) ×2 IMPLANT
PATELLA MEDIAL ATTUN 35MM KNEE (Knees) ×2 IMPLANT
PIN STEINMAN FIXATION KNEE (PIN) ×2 IMPLANT
PROTECTOR NERVE ULNAR (MISCELLANEOUS) ×2 IMPLANT
SET HNDPC FAN SPRY TIP SCT (DISPOSABLE) ×1 IMPLANT
STAPLER VISISTAT 35W (STAPLE) IMPLANT
STRIP CLOSURE SKIN 1/2X4 (GAUZE/BANDAGES/DRESSINGS) ×2 IMPLANT
SUT MNCRL AB 3-0 PS2 18 (SUTURE) ×2 IMPLANT
SUT VIC AB 0 CT1 36 (SUTURE) ×2 IMPLANT
SUT VIC AB 1 CT1 36 (SUTURE) ×4 IMPLANT
SUT VIC AB 2-0 CT1 27 (SUTURE) ×2
SUT VIC AB 2-0 CT1 TAPERPNT 27 (SUTURE) ×2 IMPLANT
SYR CONTROL 10ML LL (SYRINGE) ×4 IMPLANT
TIBIAL BASE ROT PLAT SZ 5 KNEE (Knees) ×2 IMPLANT
WRAP KNEE MAXI GEL POST OP (GAUZE/BANDAGES/DRESSINGS) ×2 IMPLANT
YANKAUER SUCT BULB TIP 10FT TU (MISCELLANEOUS) ×2 IMPLANT

## 2018-06-04 NOTE — Op Note (Signed)
NAME: Hannah [name], Hannah [name] MEDICAL RECORD [id number] ACCOUNT [id number] DATE OF BIRTH:April 28, 1951 FACILITY: [facility] LOCATION: [id number] PHYSICIAN:Shondrea Steinert L. Carlisa Eble, MD  OPERATIVE REPORT  DATE OF PROCEDURE:  06/04/2018  PREOPERATIVE DIAGNOSIS:  End-stage degenerative joint disease, right knee, with severe bone-on-bone change.  POSTOPERATIVE DIAGNOSIS:  End-stage degenerative joint disease, right knee, with severe bone-on-bone change.  PROCEDURE:  Right total knee replacement with an Attune system, size 4 femur, size 5 tibia, 6 mm bridging bearing, and a 38 mm all polyethylene patella.  SURGEON:  Dorna Leitz, MD  ASSISTANT:  Gaspar Skeeters PA-C, was present for the entire case and assisted by retraction, bone cuts, and closing to minimize OR time.  ANESTHESIA:  General.  BRIEF HISTORY:  The patient is a 66 year old female with a long history of significant complaints of right knee pain.  She has been treated conservatively for a prolonged period of time.  After failure of conservative care, is taken to the operating room  for right total knee replacement.  She had x-rays showing bone-on-bone change.  She was having night pain and light activity pain.    DESCRIPTION OF PROCEDURE:  The patient was taken to the operating room after adequate anesthesia was obtained with general anesthetic, the patient was taken to the operating table.  The right leg was prepped and draped in the usual sterile fashion.   Following this, the leg was exsanguinated, blood pressure tourniquet inflated to 300 mmHg.  Following this, an incision was made from midline approach to the knee subcutaneous tissue dissecting down to the extensor mechanism.  Medial parapatellar  arthrotomy was undertaken.  Medial and lateral meniscus were removed, retropatellar fat pad, synovium over the anterior aspect of the femur, and the anterior and posterior cruciates.  Once this was done, attention was turned to the femur.  An  intramedullary pilot  hole was drilled and a 4 degree valgus inclination cut is made and a 9 mm distal bone resected.  The femur was then sized to a 4.  Anterior and posterior cuts were made chamfers and box.  Attention was then turned to the tibia.   Tibia was cut perpendicular to its long axis and it was then sized to a 5.  It is drilled and keeled and the trial was put in place with a 6 mm bridging bearing.  Excellent range of motion and stability were achieved.  Attention was then turned to the  patella.  It was cut down to the level of 14 mm and a 35 paddle was chosen and lugs were drilled for the patella.  The knee was put through a range of motion, excellent stability and range of motion are achieved.  The trial components were removed.  The  knee was copiously lavaged, pulsatile lavage irrigation and suctioned dry.  The final components were then cemented into place, size 5 tibia, size 4 femur, a 6 mm bridging bearing trial was placed and a 35 all poly patella was placed and held with a  clamp.  Following this, the knee was put through a range of motion, excellent stability and range of motion are achieved.  The tourniquet was let down when the cement was completely hardened and all excess bone cement had been removed.  At this point,  the final poly was opened and placed.  The medial parapatellar arthrotomy was closed with #1 Vicryl running.  The skin was closed with 0 and 2-0 Vicryl and 3-0 Monocryl subcuticular.  Benzoin and Steri-Strips were applied.  Sterile compressive  dressing  was applied.  The patient was taken to recovery was noted to be in satisfactory condition.  Estimated blood loss for the procedure is minimal.  TN/NUANCE  D:06/04/2018 T:06/04/2018 JOB:[id number]

## 2018-06-04 NOTE — OR Nursing (Signed)
Dr. Berenice Primas decided not to follow total joint catheter protocol for his patient due to general anesthesia being administered.  Marquette Old, RN, BSN 06/04/18; 13:10

## 2018-06-04 NOTE — Anesthesia Postprocedure Evaluation (Signed)
Anesthesia Post Note  Patient: Hannah [name]  Procedure(s) Performed: RIGHT TOTAL KNEE ARTHROPLASTY (Right Knee)     Patient location during evaluation: PACU Anesthesia Type: General Level of consciousness: awake and alert Pain management: pain level controlled Vital Signs Assessment: post-procedure vital signs reviewed and stable Respiratory status: spontaneous breathing, nonlabored ventilation, respiratory function stable and patient connected to nasal cannula oxygen Cardiovascular status: blood pressure returned to baseline and stable Postop Assessment: no apparent nausea or vomiting Anesthetic complications: no    Last Vitals:  Vitals:   06/04/18 1651 06/04/18 1913  BP: 136/80 (!) 156/74  Pulse: 66 (!) 55  Resp: 16 16  Temp: 36.9 C 36.9 C  SpO2: 100% 100%    Last Pain:  Vitals:   06/04/18 1651  TempSrc: Oral  PainSc:                  Chelsey L Woodrum

## 2018-06-04 NOTE — Anesthesia Procedure Notes (Addendum)
Procedure Name: LMA Insertion Date/Time: 06/04/2018 12:37 PM Performed by: Lissa Morales, CRNA Pre-anesthesia Checklist: Patient identified, Emergency Drugs available, Suction available and Patient being monitored Patient Re-evaluated:Patient Re-evaluated prior to induction Oxygen Delivery Method: Circle system utilized Preoxygenation: Pre-oxygenation with 100% oxygen Induction Type: IV induction LMA: LMA with gastric port inserted LMA Size: 4.0 Tube type: Oral (sump down gastric port) Number of attempts: 1 Placement Confirmation: ETT inserted through vocal cords under direct vision,  positive ETCO2 and breath sounds checked- equal and bilateral Tube secured with: Tape Dental Injury: Teeth and Oropharynx as per pre-operative assessment

## 2018-06-04 NOTE — Transfer of Care (Signed)
Immediate Anesthesia Transfer of Care Note  Patient: Hannah [name]  Procedure(s) Performed: RIGHT TOTAL KNEE ARTHROPLASTY (Right Knee)  Patient Location: PACU  Anesthesia Type:General  Level of Consciousness: awake, alert , oriented and patient cooperative  Airway & Oxygen Therapy: Patient Spontanous Breathing and Patient connected to face mask oxygen  Post-op Assessment: Report given to RN, Post -op Vital signs reviewed and stable and Patient moving all extremities X 4  Post vital signs: stable  Last Vitals:  Vitals Value Taken Time  BP 146/73 06/04/2018  2:16 PM  Temp    Pulse 68 06/04/2018  2:21 PM  Resp 17 06/04/2018  2:21 PM  SpO2 100 % 06/04/2018  2:21 PM  Vitals shown include unvalidated device data.  Last Pain:  Vitals:   06/04/18 1213  TempSrc:   PainSc: 0-No pain      Patients Stated Pain Goal: 4 (01/19/56 5051)  Complications: No apparent anesthesia complications

## 2018-06-04 NOTE — Plan of Care (Signed)

## 2018-06-04 NOTE — Anesthesia Procedure Notes (Signed)
Anesthesia Regional Block: Adductor canal block   Pre-Anesthetic Checklist: ,, timeout performed, Correct Patient, Correct Site, Correct Laterality, Correct Procedure, Correct Position, site marked, Risks and benefits discussed,  Surgical consent,  Pre-op evaluation,  At surgeon's request and post-op pain management  Laterality: Right  Prep: chloraprep       Needles:  Injection technique: Single-shot  Needle Type: Echogenic Stimulator Needle     Needle Length: 5cm  Needle Gauge: 22     Additional Needles:   Procedures:, nerve stimulator,,, ultrasound used (permanent image in chart),,,,  Narrative:  Start time: 06/04/2018 12:06 PM End time: 06/04/2018 12:15 PM Injection made incrementally with aspirations every 5 mL.  Performed by: Personally  Anesthesiologist: Janeece Riggers, MD  Additional Notes: Functioning IV was confirmed and monitors were applied.  A 39mm 22ga Arrow echogenic stimulator needle was used. Sterile prep and drape,hand hygiene and sterile gloves were used. Ultrasound guidance: relevant anatomy identified, needle position confirmed, local anesthetic spread visualized around nerve(s)., vascular puncture avoided.  Image printed for medical record. Negative aspiration and negative test dose prior to incremental administration of local anesthetic. The patient tolerated the procedure well.

## 2018-06-04 NOTE — Anesthesia Preprocedure Evaluation (Addendum)
Anesthesia Evaluation  Patient identified by MRN, date of birth, ID band Patient awake    Reviewed: Allergy & Precautions, H&P , NPO status , Patient's Chart, lab work & pertinent test results  History of Anesthesia Complications Negative for: history of anesthetic complications  Airway Mallampati: I   Neck ROM: Full    Dental  (+) Edentulous Lower, Edentulous Upper, Dental Advisory Given   Pulmonary neg pulmonary ROS, Current Smoker,    breath sounds clear to auscultation       Cardiovascular hypertension, + Peripheral Vascular Disease  + dysrhythmias + Valvular Problems/Murmurs AI and MR  Rhythm:Regular Rate:Normal + Systolic murmurs ECHO 15 Normal LV size and systolic function, EF 74-45%. Normal RV size and systolic function. Mild to moderate AI. Mild MR.   Neuro/Psych Anxiety Depression B/L carotid stenosis ~ 50 %  Neuromuscular disease    GI/Hepatic Neg liver ROS, GERD  ,  Endo/Other  negative endocrine ROS  Renal/GU negative Renal ROS     Musculoskeletal  (+) Arthritis , Osteoarthritis,    Abdominal   Peds  Hematology negative hematology ROS (+)   Anesthesia Other Findings   Reproductive/Obstetrics                           Anesthesia Physical  Anesthesia Plan  ASA: III  Anesthesia Plan:    Post-op Pain Management:    Induction: Intravenous  PONV Risk Score and Plan: 1 and Treatment may vary due to age or medical condition and Ondansetron  Airway Management Planned: Oral ETT and LMA  Additional Equipment:   Intra-op Plan:   Post-operative Plan: Extubation in OR  Informed Consent: I have reviewed the patients History and Physical, chart, labs and discussed the procedure including the risks, benefits and alternatives for the proposed anesthesia with the patient or authorized representative who has indicated his/her understanding and acceptance.   Dental advisory  given  Plan Discussed with: CRNA and Surgeon  Anesthesia Plan Comments:         Anesthesia Quick Evaluation

## 2018-06-04 NOTE — Discharge Instructions (Signed)

## 2018-06-04 NOTE — H&P (Signed)
TOTAL KNEE ADMISSION H&P  Patient is being admitted for right total knee arthroplasty.  Subjective:  Chief Complaint:right knee pain.  HPI: Hannah [name], 67 y.o. female, has a history of pain and functional disability in the right knee due to arthritis and has failed non-surgical conservative treatments for greater than 12 weeks to includeNSAID's and/or analgesics, corticosteriod injections, viscosupplementation injections and activity modification.  Onset of symptoms was gradual, starting 5 years ago with gradually worsening course since that time. The patient noted no past surgery on the right knee(s).  Patient currently rates pain in the right knee(s) at 8 out of 10 with activity. Patient has night pain, worsening of pain with activity and weight bearing, pain that interferes with activities of daily living, pain with passive range of motion and joint swelling.  Patient has evidence of subchondral sclerosis, periarticular osteophytes and joint space narrowing by imaging studies. This patient has had failure of reasonable conservative care. There is no active infection.  Patient Active Problem List   Diagnosis Date Noted  . Acute sinus infection 04/05/2018  . Bilateral carotid artery stenosis 09/24/2017  . Aortic valve sclerosis 09/24/2017  . Tobacco abuse 09/24/2017  . Vocal cord polyps 09/07/2017  . Dysphagia 09/07/2017  . Cervical strain 07/24/2017  . Prediabetes 03/09/2017  . Chest pain 12/29/2016  . GERD (gastroesophageal reflux disease) 02/27/2016  . Hormone replacement therapy (HRT) 02/27/2016  . Anxiety 02/27/2016  . Osteoarthritis of left knee 02/19/2014  . S/P total knee replacement 02/13/2014  . RBBB 02/08/2014  . HTN (hypertension) 02/08/2014  . PVD - bilateral 60-79% carotid strenosis 02/08/2014  . Mixed hyperlipidemia 11/29/2013  . Carpal tunnel syndrome, bilateral 11/23/2013  . DJD (degenerative joint disease) 11/23/2013  . Murmur- mild -mod AR, mild MR 11/10/2013   . Chronic constipation 12/16/2012  . Arthritis 10/08/2012  . Nicotine abuse 12/29/2011   Past Medical History:  Diagnosis Date  . Allergy   . Anxiety   . Arthritis   . Carpal tunnel syndrome   . Depression   . Diverticulosis   . Dyslipidemia   . External hemorrhoids   . GERD (gastroesophageal reflux disease)   . Heart murmur    mild-moderate AR  . Hyperlipidemia    on meds   . Hypertension   . Internal hemorrhoids   . Osteoarthritis   . Pre-diabetes   . PVD (peripheral vascular disease) (HCC)    moderate carotid disease  . RBBB   . Smoker   . Tubular adenoma of colon   . Vocal cord polyps     Past Surgical History:  Procedure Laterality Date  . ABDOMINAL HYSTERECTOMY  1994  . COLONOSCOPY    . COLONOSCOPY W/ POLYPECTOMY  2009  . HEMORRHOID SURGERY    . right total knee arthroplasty     Dr. Emeterio Reeve 06-04-18  . SPINE SURGERY  08/16/2009  . TOTAL KNEE ARTHROPLASTY Left 02/13/2014   Procedure: TOTAL KNEE ARTHROPLASTY;  Surgeon: Alta Corning, MD;  Location: [facility];  Service: Orthopedics;  Laterality: Left;  . TUBAL LIGATION      No current facility-administered medications for this encounter.    Current Outpatient Medications  Medication Sig Dispense Refill Last Dose  . ALPRAZolam (XANAX) 0.5 MG tablet Take 1 tablet (0.5 mg total) by mouth 2 (two) times daily as needed. for sleep 60 tablet 5 Taking  . Ascorbic Acid (VITAMIN C PO) Take 1 tablet by mouth daily.   Taking  . aspirin 81 MG tablet Take 81  mg by mouth daily.   Taking  . bisoprolol-hydrochlorothiazide (ZIAC) 5-6.25 MG tablet TAKE 1/2 TABLET EVERY DAY (Patient taking differently: Take 0.5 tablets by mouth daily. ) 45 tablet 1 Taking  . Calcium Carbonate (CALCIUM 600 PO) Take 1,200 mg by mouth daily.   Taking  . cholecalciferol (VITAMIN D) 1000 UNITS tablet Take 1 tablet (1,000 Units total) by mouth daily. 30 tablet 5 Taking  . estradiol (ESTRACE) 0.5 MG tablet Take 0.5 tablets (0.25 mg total) by mouth daily.  (Patient taking differently: Take 0.25-0.5 mg by mouth daily. ) 90 tablet 1 Taking  . fenofibrate 160 MG tablet Take 1 tablet (160 mg total) by mouth daily. 90 tablet 3 Taking  . fexofenadine (ALLEGRA) 180 MG tablet Take 180 mg by mouth daily as needed for allergies.    Taking  . linaclotide (LINZESS) 290 MCG CAPS capsule Take 1 capsule (290 mcg total) by mouth daily before breakfast. (Patient taking differently: Take 290 mcg by mouth daily as needed (bowel movements). ) 30 capsule 3 Taking  . meloxicam (MOBIC) 15 MG tablet Take 1 tablet (15 mg total) by mouth daily. (Patient taking differently: Take 15 mg by mouth daily as needed for pain. ) 30 tablet 0 Taking  . omeprazole (PRILOSEC) 40 MG capsule TAKE 1 CAPSULE 2 TIMES DAILY BEFORE LUNCH AND SUPPER. (Patient taking differently: Take 40 mg by mouth 2 (two) times daily. ) 180 capsule 1 Taking  . pravastatin (PRAVACHOL) 20 MG tablet TAKE 1 TABLET EVERY EVENING 90 tablet 1 Taking  . Probiotic Product (PROBIOTIC DAILY PO) Take 1 capsule by mouth daily.   Taking   Allergies  Allergen Reactions  . Amlodipine     Rash, swelling  . Chantix [Varenicline Tartrate] Other (See Comments)    Tongue swell,sob  . Clarithromycin Rash  . Lisinopril Hives  . Simvastatin Hives  . Wellbutrin [Bupropion Hcl] Hives  . Lipitor [Atorvastatin]     Dizziness per patient    Social History   Tobacco Use  . Smoking status: Light Tobacco Smoker    Packs/day: 0.50    Years: 40.00    Pack years: 20.00    Types: Cigarettes  . Smokeless tobacco: Never Used  . Tobacco comment: PACK WILL LAST 3 days  Substance Use Topics  . Alcohol use: No    Family History  Problem Relation Age of Onset  . Cancer Father   . Prostate cancer Father   . Diabetes Sister   . Stroke Maternal Grandfather   . Colon cancer Neg Hx   . Colon polyps Neg Hx   . Rectal cancer Neg Hx   . Stomach cancer Neg Hx   . Esophageal cancer Neg Hx      ROS ROS: I have reviewed the patient's  review of systems thoroughly and there are no positive responses as relates to the HPI. Objective:  Physical Exam  Vital signs in last 24 hours:   Well-developed well-nourished patient in no acute distress. Alert and oriented x3 HEENT:within normal limits Cardiac: Regular rate and rhythm Pulmonary: Lungs clear to auscultation Abdomen: Soft and nontender.  Normal active bowel sounds  Musculoskeletal: r knee: limiteed rom painful rom no instability Labs: Recent Results (from the past 2160 hour(s))  CBC with Differential/Platelet     Status: Abnormal   Collection Time: 03/10/18  9:35 AM  Result Value Ref Range   WBC 6.1 4.0 - 10.5 K/uL   RBC 4.34 3.87 - 5.11 Mil/uL   Hemoglobin 11.7 (L) 12.0 -  15.0 g/dL   HCT 36.2 36.0 - 46.0 %   MCV 83.4 78.0 - 100.0 fl   MCHC 32.4 30.0 - 36.0 g/dL   RDW 13.0 11.5 - 15.5 %   Platelets 250.0 150.0 - 400.0 K/uL   Neutrophils Relative % 45.6 43.0 - 77.0 %   Lymphocytes Relative 42.1 12.0 - 46.0 %   Monocytes Relative 8.7 3.0 - 12.0 %   Eosinophils Relative 3.0 0.0 - 5.0 %   Basophils Relative 0.6 0.0 - 3.0 %   Neutro Abs 2.8 1.4 - 7.7 K/uL   Lymphs Abs 2.6 0.7 - 4.0 K/uL   Monocytes Absolute 0.5 0.1 - 1.0 K/uL   Eosinophils Absolute 0.2 0.0 - 0.7 K/uL   Basophils Absolute 0.0 0.0 - 0.1 K/uL  Lipid panel     Status: None   Collection Time: 03/10/18  9:35 AM  Result Value Ref Range   Cholesterol 138 0 - 200 mg/dL    Comment: ATP III Classification       Desirable:  < 200 mg/dL               Borderline High:  200 - 239 mg/dL          High:  > = 240 mg/dL   Triglycerides 129.0 0.0 - 149.0 mg/dL    Comment: Normal:  <150 mg/dLBorderline High:  150 - 199 mg/dL   HDL 52.00 >39.00 mg/dL   VLDL 25.8 0.0 - 40.0 mg/dL   LDL Cholesterol 60 0 - 99 mg/dL   Total CHOL/HDL Ratio 3     Comment:                Men          Women1/2 Average Risk     3.4          3.3Average Risk          5.0          4.42X Average Risk          9.6          7.13X Average Risk           15.0          11.0                       NonHDL 85.62     Comment: NOTE:  Non-HDL goal should be 30 mg/dL higher than patient's LDL goal (i.e. LDL goal of < 70 mg/dL, would have non-HDL goal of < 100 mg/dL)  Hemoglobin A1c     Status: None   Collection Time: 03/10/18  9:35 AM  Result Value Ref Range   Hgb A1c MFr Bld 6.2 4.6 - 6.5 %    Comment: Glycemic Control Guidelines for People with Diabetes:Non Diabetic:  <6%Goal of Therapy: <7%Additional Action Suggested:  >8%   Comprehensive metabolic panel     Status: Abnormal   Collection Time: 03/10/18  9:35 AM  Result Value Ref Range   Sodium 142 135 - 145 mEq/L   Potassium 4.1 3.5 - 5.1 mEq/L   Chloride 103 96 - 112 mEq/L   CO2 34 (H) 19 - 32 mEq/L   Glucose, Bld 86 70 - 99 mg/dL   BUN 9 6 - 23 mg/dL   Creatinine, Ser 0.84 0.40 - 1.20 mg/dL   Total Bilirubin 0.4 0.2 - 1.2 mg/dL   Alkaline Phosphatase 33 (L) 39 - 117 U/L   AST 24 0 -  37 U/L   ALT 17 0 - 35 U/L   Total Protein 6.9 6.0 - 8.3 g/dL   Albumin 4.3 3.5 - 5.2 g/dL   Calcium 10.0 8.4 - 10.5 mg/dL   GFR 86.31 >60.00 mL/min  HM MAMMOGRAPHY     Status: None   Collection Time: 04/22/18 12:00 AM  Result Value Ref Range   HM Mammogram 0-4 Bi-Rad 0-4 Bi-Rad, Self Reported Normal    Comment: Cat 0: Additional imaging needed  HM MAMMOGRAPHY     Status: None   Collection Time: 04/27/18 12:00 AM  Result Value Ref Range   HM Mammogram 0-4 Bi-Rad 0-4 Bi-Rad, Self Reported Normal  Urinalysis, Routine w reflex microscopic     Status: None   Collection Time: 05/18/18 11:23 AM  Result Value Ref Range   Color, Urine YELLOW YELLOW   APPearance CLEAR CLEAR   Specific Gravity, Urine 1.006 1.005 - 1.030   pH 5.0 5.0 - 8.0   Glucose, UA NEGATIVE NEGATIVE mg/dL   Hgb urine dipstick NEGATIVE NEGATIVE   Bilirubin Urine NEGATIVE NEGATIVE   Ketones, ur NEGATIVE NEGATIVE mg/dL   Protein, ur NEGATIVE NEGATIVE mg/dL   Nitrite NEGATIVE NEGATIVE   Leukocytes, UA NEGATIVE NEGATIVE     Comment: Performed at [facility], [place] [address], [place], [place] [address]  Surgical pcr screen     Status: None   Collection Time: 05/18/18 11:33 AM  Result Value Ref Range   MRSA, PCR NEGATIVE NEGATIVE   Staphylococcus aureus NEGATIVE NEGATIVE    Comment: (NOTE) The Xpert SA Assay (FDA approved for NASAL specimens in patients 29 years of age and older), is one component of a comprehensive surveillance program. It is not intended to diagnose infection nor to guide or monitor treatment. Performed at [facility], [place] [address], [place], [place] [address]   APTT     Status: None   Collection Time: 05/18/18 12:19 PM  Result Value Ref Range   aPTT 36 24 - 36 seconds    Comment: Performed at [facility], [place] [address] [place], [place] [address]  CBC WITH DIFFERENTIAL     Status: Abnormal   Collection Time: 05/18/18 12:19 PM  Result Value Ref Range   WBC 6.6 4.0 - 10.5 K/uL   RBC 4.06 3.87 - 5.11 MIL/uL   Hemoglobin 10.9 (L) 12.0 - 15.0 g/dL   HCT 36.1 36.0 - 46.0 %   MCV 88.9 80.0 - 100.0 fL   MCH 26.8 26.0 - 34.0 pg   MCHC 30.2 30.0 - 36.0 g/dL   RDW 12.6 11.5 - 15.5 %   Platelets 223 150 - 400 K/uL   nRBC 0.0 0.0 - 0.2 %   Neutrophils Relative % 47 %   Neutro Abs 3.1 1.7 - 7.7 K/uL   Lymphocytes Relative 43 %   Lymphs Abs 2.8 0.7 - 4.0 K/uL   Monocytes Relative 6 %   Monocytes Absolute 0.4 0.1 - 1.0 K/uL   Eosinophils Relative 3 %   Eosinophils Absolute 0.2 0.0 - 0.5 K/uL   Basophils Relative 1 %   Basophils Absolute 0.0 0.0 - 0.1 K/uL   Immature Granulocytes 0 %   Abs Immature Granulocytes 0.02 0.00 - 0.07 K/uL    Comment: Performed at [facility], [place] [address], [place], [place] [address]  Comprehensive metabolic panel     Status: Abnormal   Collection Time: 05/18/18 12:19 PM  Result Value Ref Range  Sodium 142 135 - 145 mmol/L   Potassium 3.4 (L) 3.5 - 5.1 mmol/L   Chloride 105 98  - 111 mmol/L   CO2 29 22 - 32 mmol/L   Glucose, Bld 100 (H) 70 - 99 mg/dL   BUN 7 (L) 8 - 23 mg/dL   Creatinine, Ser 0.80 0.44 - 1.00 mg/dL   Calcium 9.8 8.9 - 10.3 mg/dL   Total Protein 7.2 6.5 - 8.1 g/dL   Albumin 4.5 3.5 - 5.0 g/dL   AST 38 15 - 41 U/L   ALT 23 0 - 44 U/L   Alkaline Phosphatase 34 (L) 38 - 126 U/L   Total Bilirubin 0.7 0.3 - 1.2 mg/dL   GFR calc non Af Amer >60 >60 mL/min   GFR calc Af Amer >60 >60 mL/min    Comment: (NOTE) The eGFR has been calculated using the CKD EPI equation. This calculation has not been validated in all clinical situations. eGFR's persistently <60 mL/min signify possible Chronic Kidney Disease.    Anion gap 8 5 - 15    Comment: Performed at [facility], [place] [address], [place], [place] [address]  Protime-INR     Status: None   Collection Time: 05/18/18 12:19 PM  Result Value Ref Range   Prothrombin Time 12.6 11.4 - 15.2 seconds   INR 0.95     Comment: Performed at [facility], [place] [address], [place], [place] [address]  Hemoglobin A1c     Status: Abnormal   Collection Time: 05/18/18 12:19 PM  Result Value Ref Range   Hgb A1c MFr Bld 5.9 (H) 4.8 - 5.6 %    Comment: (NOTE) Pre diabetes:          5.7%-6.4% Diabetes:              >6.4% Glycemic control for   <7.0% adults with diabetes    Mean Plasma Glucose 122.63 mg/dL    Comment: Performed at [facility] [address], [place], [place] [address]    Estimated body mass index is 22.26 kg/m as calculated from the following:   Height as of 05/18/18: 5' (1.524 m).   Weight as of 05/18/18: 51.7 kg.   Imaging Review Plain radiographs demonstrate severe degenerative joint disease of the right knee(s). The overall alignment ismild varus. The bone quality appears to be fair for age and reported activity level.   Preoperative templating of the joint replacement has been completed, documented, and submitted to the Operating Room personnel  in order to optimize intra-operative equipment management.    Patient's anticipated LOS is less than 2 midnights, meeting these requirements: - Younger than 21 - Lives within 1 hour of care - Has a competent adult at home to recover with post-op recover - NO history of  - Chronic pain requiring opiods  - Diabetes  - Coronary Artery Disease  - Heart failure  - Heart attack  - Stroke  - DVT/VTE  - Cardiac arrhythmia  - Respiratory Failure/COPD  - Renal failure  - Anemia  - Advanced Liver disease        Assessment/Plan:  End stage arthritis, right knee   The patient history, physical examination, clinical judgment of the provider and imaging studies are consistent with end stage degenerative joint disease of the right knee(s) and total knee arthroplasty is deemed medically necessary. The treatment options including medical management, injection therapy arthroscopy and arthroplasty were discussed at length. The risks and benefits of total  knee arthroplasty were presented and reviewed. The risks due to aseptic loosening, infection, stiffness, patella tracking problems, thromboembolic complications and other imponderables were discussed. The patient acknowledged the explanation, agreed to proceed with the plan and consent was signed. Patient is being admitted for inpatient treatment for surgery, pain control, PT, OT, prophylactic antibiotics, VTE prophylaxis, progressive ambulation and ADL's and discharge planning. The patient is planning to be discharged home with home health services

## 2018-06-04 NOTE — Evaluation (Signed)
Physical Therapy Evaluation Patient Details Name: Hannah [name] MRN: [id number] DOB: 10-24-1950 Today's Date: 06/04/2018   History of Present Illness  67 yo female s/p R TKR on 06/04/18. PMH includes bilateral carotid artery stenosis, tobacco use, dysphagia, GERD, pre-dm, anxiety, OA, RBBB, HTN, HLD, PVD, L TKR 2015.  Clinical Impression   Pt presents with R knee pain, RLE weakness post-surgery, increased effort to perform mobility tasks. Pt to benefit from acute PT to address deficits. Pt able to stand pivot to [facility] and back to bed with min assist for steadying (pt without foley catheter). Pt reporting RLE feeling very heavy and weak, suspect due to block. PT deferred ambulation this session due to this. PT to progress mobility as tolerated, and will continue to follow acutely.      Follow Up Recommendations Follow surgeon's recommendation for DC plan and follow-up therapies;Supervision for mobility/OOB(HHPT )    Equipment Recommendations  None recommended by PT    Recommendations for Other Services       Precautions / Restrictions Precautions Precautions: Fall Required Braces or Orthoses: Knee Immobilizer - Right Knee Immobilizer - Right: On when out of bed or walking;Discontinue once straight leg raise with < 10 degree lag Restrictions Weight Bearing Restrictions: No Other Position/Activity Restrictions: WBAT       Mobility  Bed Mobility Overal bed mobility: Needs Assistance Bed Mobility: Supine to Sit     Supine to sit: Min guard;HOB elevated     General bed mobility comments: Min guard for safety, pt moving quickly to EOB due to needing to use BSC.   Transfers Overall transfer level: Needs assistance   Transfers: Stand Pivot Transfers   Stand pivot transfers: Min assist;From elevated surface       General transfer comment: Pt mod independent in power up and standing, standing before PT had sized walker and unexpectedly. min assist for stand pivot to [facility] for  steadying. Pt with continued complaints of RLE feeling very heavy, unsafe to attempt ambulation due to this.   Ambulation/Gait Ambulation/Gait assistance: (NT )              Stairs            Wheelchair Mobility    Modified Rankin (Stroke Patients Only)       Balance Overall balance assessment: Mild deficits observed, not formally tested                                           Pertinent Vitals/Pain Pain Assessment: Faces Faces Pain Scale: Hurts a little bit Pain Location: R knee, after mobility  Pain Descriptors / Indicators: Discomfort Pain Intervention(s): Limited activity within patient's tolerance;Repositioned;Monitored during session    Home Living Family/patient expects to be discharged to:: Private residence Living Arrangements: Spouse/significant other Available Help at Discharge: Family;Available 24 hours/day(husband works, but daughter will also be assisting to give pt 24/7 assist) Type of Home: House Home Access: Level entry     Home Layout: One level Home Equipment: Walker - 2 wheels      Prior Function Level of Independence: Independent               Hand Dominance   Dominant Hand: Right    Extremity/Trunk Assessment   Upper Extremity Assessment Upper Extremity Assessment: Overall WFL for tasks assessed    Lower Extremity Assessment Lower Extremity Assessment: RLE deficits/detail RLE Deficits /  Details: suspected post-surgical weakness; pt stating her RLE felt "like a log" and was heavy, pt unable to DF/PF, but able to perform SLR and quad set.     Cervical / Trunk Assessment Cervical / Trunk Assessment: Normal  Communication   Communication: No difficulties  Cognition Arousal/Alertness: Awake/alert Behavior During Therapy: WFL for tasks assessed/performed Overall Cognitive Status: Within Functional Limits for tasks assessed                                 General Comments: Pt reporting  feeling loopy, but states "I always feel loopy"      General Comments      Exercises     Assessment/Plan    PT Assessment Patient needs continued PT services  PT Problem List Decreased strength;Pain;Decreased range of motion;Decreased activity tolerance;Decreased knowledge of use of DME;Decreased balance;Decreased mobility       PT Treatment Interventions DME instruction;Therapeutic activities;Gait training;Therapeutic exercise;Patient/family education;Balance training;Functional mobility training    PT Goals (Current goals can be found in the Care Plan section)  Acute Rehab PT Goals PT Goal Formulation: With patient Time For Goal Achievement: 06/11/18 Potential to Achieve Goals: Good    Frequency 7X/week   Barriers to discharge        Co-evaluation               AM-PAC PT "6 Clicks" Mobility  Outcome Measure Help needed turning from your back to your side while in a flat bed without using bedrails?: A Little Help needed moving from lying on your back to sitting on the side of a flat bed without using bedrails?: A Little Help needed moving to and from a bed to a chair (including a wheelchair)?: A Little Help needed standing up from a chair using your arms (e.g., wheelchair or bedside chair)?: A Little Help needed to walk in hospital room?: A Little Help needed climbing 3-5 steps with a railing? : A Little 6 Click Score: 18    End of Session   Activity Tolerance: Patient tolerated treatment well;Other (comment)(limited by block ) Patient left: in bed;with bed alarm set;with call bell/phone within reach;with SCD's reapplied Nurse Communication: Mobility status PT Visit Diagnosis: Other abnormalities of gait and mobility (R26.89);Difficulty in walking, not elsewhere classified (R26.2)    Time: 3664-4034 PT Time Calculation (min) (ACUTE ONLY): 15 min   Charges:   PT Evaluation $PT Eval Low Complexity: 1 Low          Julien Girt, PT Acute Rehabilitation  Services Pager [phone]  Office [phone]   Candis Kabel D Elonda Husky 06/04/2018, 7:12 PM

## 2018-06-04 NOTE — Brief Op Note (Signed)
06/04/2018  1:52 PM  PATIENT:  Hannah [name]  67 y.o. female  PRE-OPERATIVE DIAGNOSIS:  OSTEOARTHRITIS RIGHT KNEE  POST-OPERATIVE DIAGNOSIS:  OSTEOARTHRITIS RIGHT KNEE  PROCEDURE:  Procedure(s) with comments: RIGHT TOTAL KNEE ARTHROPLASTY (Right) - Adductor Block  SURGEON:  Surgeon(s) and Role:    Dorna Leitz, MD - Primary  PHYSICIAN ASSISTANT:   ASSISTANTS: bethune   ANESTHESIA:   general  EBL:  minimal   BLOOD ADMINISTERED:none  DRAINS: none   LOCAL MEDICATIONS USED:  MARCAINE    and OTHER experel  SPECIMEN:  No Specimen  DISPOSITION OF SPECIMEN:  N/A  COUNTS:  YES  TOURNIQUET:   Total Tourniquet Time Documented: Thigh (Right) - 47 minutes Total: Thigh (Right) - 47 minutes   DICTATION: .Other Dictation: Dictation Number [id number]  PLAN OF CARE: Admit to inpatient   PATIENT DISPOSITION:  PACU - hemodynamically stable.   Delay start of Pharmacological VTE agent (>24hrs) due to surgical blood loss or risk of bleeding: no

## 2018-06-04 NOTE — Progress Notes (Signed)
Assisted Dr. Ambrose Pancoast with right, ultrasound guided, adductor canal block. Side rails up, monitors on throughout procedure. See vital signs in flow sheet. Tolerated Procedure well.

## 2018-06-05 DIAGNOSIS — Z96652 Presence of left artificial knee joint: Secondary | ICD-10-CM | POA: Diagnosis not present

## 2018-06-05 DIAGNOSIS — Z96651 Presence of right artificial knee joint: Secondary | ICD-10-CM | POA: Diagnosis not present

## 2018-06-05 DIAGNOSIS — K219 Gastro-esophageal reflux disease without esophagitis: Secondary | ICD-10-CM | POA: Diagnosis not present

## 2018-06-05 DIAGNOSIS — I10 Essential (primary) hypertension: Secondary | ICD-10-CM | POA: Diagnosis not present

## 2018-06-05 DIAGNOSIS — M1711 Unilateral primary osteoarthritis, right knee: Secondary | ICD-10-CM | POA: Diagnosis not present

## 2018-06-05 DIAGNOSIS — Z79890 Hormone replacement therapy (postmenopausal): Secondary | ICD-10-CM | POA: Diagnosis not present

## 2018-06-05 DIAGNOSIS — Z7982 Long term (current) use of aspirin: Secondary | ICD-10-CM | POA: Diagnosis not present

## 2018-06-05 DIAGNOSIS — F419 Anxiety disorder, unspecified: Secondary | ICD-10-CM | POA: Diagnosis not present

## 2018-06-05 DIAGNOSIS — Z79899 Other long term (current) drug therapy: Secondary | ICD-10-CM | POA: Diagnosis not present

## 2018-06-05 DIAGNOSIS — I739 Peripheral vascular disease, unspecified: Secondary | ICD-10-CM | POA: Diagnosis not present

## 2018-06-05 LAB — CBC
HCT: 33.4 % — ABNORMAL LOW (ref 36.0–46.0)
Hemoglobin: 9.8 g/dL — ABNORMAL LOW (ref 12.0–15.0)
MCH: 26.5 pg (ref 26.0–34.0)
MCHC: 29.3 g/dL — ABNORMAL LOW (ref 30.0–36.0)
MCV: 90.3 fL (ref 80.0–100.0)
Platelets: 211 10*3/uL (ref 150–400)
RBC: 3.7 MIL/uL — ABNORMAL LOW (ref 3.87–5.11)
RDW: 12.9 % (ref 11.5–15.5)
WBC: 10.3 10*3/uL (ref 4.0–10.5)
nRBC: 0 % (ref 0.0–0.2)

## 2018-06-05 NOTE — Progress Notes (Signed)
Physical Therapy Treatment Patient Details Name: Hannah [name] MRN: [id number] DOB: Nov 13, 1950 Today's Date: 06/05/2018    History of Present Illness 67 yo female s/p R TKR on 06/04/18. PMH includes bilateral carotid artery stenosis, tobacco use, dysphagia, GERD, pre-dm, anxiety, OA, RBBB, HTN, HLD, PVD, L TKR 2015.    PT Comments    New post op R foot drop noted, RN and ortho PA aware. Pt also has decreased sensation to light touch R foot. Pt is progressing well with mobility, she ambulated 160' with RW and verbal cues for safety. Instructed pt and her daughter in HEP.    Follow Up Recommendations  Follow surgeon's recommendation for DC plan and follow-up therapies;Supervision for mobility/OOB(HHPT )     Equipment Recommendations  None recommended by PT    Recommendations for Other Services       Precautions / Restrictions Precautions Precautions: Fall;Knee;Other (comment) Precaution Booklet Issued: Yes (comment) Precaution Comments: reviewed no pillow under knee; new post op R foot drop Required Braces or Orthoses: Knee Immobilizer - Right Knee Immobilizer - Right: On when out of bed or walking;Discontinue once straight leg raise with < 10 degree lag Restrictions Weight Bearing Restrictions: No Other Position/Activity Restrictions: WBAT     Mobility  Bed Mobility Overal bed mobility: Needs Assistance Bed Mobility: Supine to Sit     Supine to sit: HOB elevated;Supervision     General bed mobility comments: supervision for safety  Transfers Overall transfer level: Needs assistance Equipment used: Rolling walker (2 wheeled) Transfers: Sit to/from Stand Sit to Stand: Min guard         General transfer comment: VCs for hand placement and safety, pt attempted to stand prior to having RW in front of her  Ambulation/Gait Ambulation/Gait assistance: Min guard(NT ) Gait Distance (Feet): 160 Feet Assistive device: Rolling walker (2 wheeled) Gait Pattern/deviations:  Step-to pattern;Decreased dorsiflexion - right;Steppage Gait velocity: too fast   General Gait Details: new post op foot drop R (RN and ortho PA aware), VCs sequencing and safety, VCs to decrease velocity esp with turns   Marine scientist Rankin (Stroke Patients Only)       Balance Overall balance assessment: Modified Independent                                          Cognition Arousal/Alertness: Awake/alert Behavior During Therapy: WFL for tasks assessed/performed Overall Cognitive Status: Within Functional Limits for tasks assessed                                 General Comments: Pt reporting feeling loopy, but states "I always feel loopy"      Exercises Total Joint Exercises Ankle Circles/Pumps: PROM;Right;10 reps;Supine Quad Sets: AROM;Right;10 reps;Supine Short Arc Quad: AROM;Right;10 reps;Supine Heel Slides: AAROM;Right;10 reps;Supine Hip ABduction/ADduction: AROM;Right;10 reps;Supine Straight Leg Raises: AROM;Right;10 reps;Supine    General Comments        Pertinent Vitals/Pain Pain Score: 7  Pain Location: R knee, with mobility Pain Descriptors / Indicators: Aching Pain Intervention(s): Limited activity within patient's tolerance;Monitored during session;Premedicated before session;Ice applied    Home Living                      Prior Function  PT Goals (current goals can now be found in the care plan section) Acute Rehab PT Goals PT Goal Formulation: With patient/family Time For Goal Achievement: 06/11/18 Potential to Achieve Goals: Good Progress towards PT goals: Progressing toward goals    Frequency    7X/week      PT Plan Current plan remains appropriate    Co-evaluation              AM-PAC PT "6 Clicks" Mobility   Outcome Measure  Help needed turning from your back to your side while in a flat bed without using bedrails?:  None Help needed moving from lying on your back to sitting on the side of a flat bed without using bedrails?: None Help needed moving to and from a bed to a chair (including a wheelchair)?: A Little Help needed standing up from a chair using your arms (e.g., wheelchair or bedside chair)?: A Little Help needed to walk in hospital room?: A Little Help needed climbing 3-5 steps with a railing? : A Little 6 Click Score: 20    End of Session Equipment Utilized During Treatment: Gait belt Activity Tolerance: Patient tolerated treatment well Patient left: with call bell/phone within reach;in chair;with family/visitor present Nurse Communication: Mobility status PT Visit Diagnosis: Other abnormalities of gait and mobility (R26.89);Difficulty in walking, not elsewhere classified (R26.2)     Time: 8335-8251 PT Time Calculation (min) (ACUTE ONLY): 36 min  Charges:  $Gait Training: 8-22 mins $Therapeutic Exercise: 8-22 mins                     Blondell Reveal Kistler PT 06/05/2018  Acute Rehabilitation Services Pager [phone] Office [phone]

## 2018-06-05 NOTE — Progress Notes (Signed)
Physical Therapy Treatment Patient Details Name: Hannah [name] MRN: [id number] DOB: Sep 14, 1950 Today's Date: 06/05/2018    History of Present Illness 67 yo female s/p R TKR on 06/04/18. PMH includes bilateral carotid artery stenosis, tobacco use, dysphagia, GERD, pre-dm, anxiety, OA, RBBB, HTN, HLD, PVD, L TKR 2015.    PT Comments    New postop R foot drop and numbness is unchanged. Pt is progressing well with mobility and is ready to DC home from PT standpoint. She ambulated 180' with RW and demonstrates good understanding of HEP.   Follow Up Recommendations  Follow surgeon's recommendation for DC plan and follow-up therapies;Supervision for mobility/OOB(HHPT )     Equipment Recommendations  None recommended by PT    Recommendations for Other Services       Precautions / Restrictions Precautions Precautions: Fall;Knee;Other (comment) Precaution Booklet Issued: Yes (comment) Precaution Comments: reviewed no pillow under knee; new post op R foot drop Required Braces or Orthoses: (DCed KI, pt independent with SLR) Knee Immobilizer - Right: On when out of bed or walking;Discontinue once straight leg raise with < 10 degree lag Restrictions Weight Bearing Restrictions: No Other Position/Activity Restrictions: WBAT     Mobility  Bed Mobility Overal bed mobility: Needs Assistance Bed Mobility: Supine to Sit;Sit to Supine     Supine to sit: HOB elevated;Modified independent (Device/Increase time) Sit to supine: Modified independent (Device/Increase time)   General bed mobility comments: HOB up 20*  Transfers Overall transfer level: Needs assistance Equipment used: Rolling walker (2 wheeled) Transfers: Sit to/from Stand Sit to Stand: Supervision         General transfer comment: VCs for hand placement and safety  Ambulation/Gait Ambulation/Gait assistance: Supervision(NT ) Gait Distance (Feet): 180 Feet Assistive device: Rolling walker (2 wheeled) Gait  Pattern/deviations: Step-to pattern;Decreased dorsiflexion - right;Steppage Gait velocity: WFL   General Gait Details: new post op foot drop R (RN and ortho PA aware), VCs sequencing and safety, VCs to decrease velocity esp with turns   Marine scientist Rankin (Stroke Patients Only)       Balance Overall balance assessment: Modified Independent                                          Cognition Arousal/Alertness: Awake/alert Behavior During Therapy: WFL for tasks assessed/performed Overall Cognitive Status: Within Functional Limits for tasks assessed                                 General Comments: Pt reporting feeling loopy, but states "I always feel loopy"      Exercises Total Joint Exercises Ankle Circles/Pumps: PROM;Right;10 reps;Supine Quad Sets: AROM;Right;10 reps;Supine Short Arc Quad: AROM;Right;10 reps;Supine Heel Slides: AAROM;Right;10 reps;Supine Hip ABduction/ADduction: AROM;Right;10 reps;Supine Straight Leg Raises: AROM;Right;10 reps;Supine Long Arc Quad: AROM;10 reps;Right;Seated Knee Flexion: AROM;AAROM;Right;10 reps;Seated Goniometric ROM: 5-80* AAROM R knee    General Comments        Pertinent Vitals/Pain Pain Score: 7  Pain Location: R knee, with mobility Pain Descriptors / Indicators: Aching Pain Intervention(s): Limited activity within patient's tolerance;Monitored during session;Premedicated before session;Ice applied    Home Living                      Prior  Function            PT Goals (current goals can now be found in the care plan section) Acute Rehab PT Goals PT Goal Formulation: With patient/family Time For Goal Achievement: 06/11/18 Potential to Achieve Goals: Good Progress towards PT goals: Goals met/education completed, patient discharged from PT    Frequency    7X/week      PT Plan Current plan remains appropriate    Co-evaluation               AM-PAC PT "6 Clicks" Mobility   Outcome Measure  Help needed turning from your back to your side while in a flat bed without using bedrails?: None Help needed moving from lying on your back to sitting on the side of a flat bed without using bedrails?: None Help needed moving to and from a bed to a chair (including a wheelchair)?: None Help needed standing up from a chair using your arms (e.g., wheelchair or bedside chair)?: None Help needed to walk in hospital room?: None Help needed climbing 3-5 steps with a railing? : A Little 6 Click Score: 23    End of Session Equipment Utilized During Treatment: Gait belt Activity Tolerance: Patient tolerated treatment well Patient left: with call bell/phone within reach;in chair;with family/visitor present Nurse Communication: Mobility status PT Visit Diagnosis: Other abnormalities of gait and mobility (R26.89);Difficulty in walking, not elsewhere classified (R26.2)     Time: 3475-8307 PT Time Calculation (min) (ACUTE ONLY): 26 min  Charges:  $Gait Training: 8-22 mins $Therapeutic Exercise: 8-22 mins                    Blondell Reveal Kistler PT 06/05/2018  Acute Rehabilitation Services Pager [phone] Office [phone]

## 2018-06-05 NOTE — Care Management Note (Signed)
Case Management Note  Patient Details  Name: Hannah [name] MRN: [id number] Date of Birth: 1951/02/10  Subjective/Objective:   S/p R TKR                 Action/Plan: NCM spoke to pt and husband at bedside. Offered choice for HH/CMS list provided/placed on chart. Pt agreeable to [facility]. (preoperatively arranged from surgeon's office). States she has a RW at home. States she did get a 3n1 bedside commode from her insurance, but she misplaced.   Expected Discharge Date:  06/05/18               Expected Discharge Plan:  [place]  In-House Referral:  NA  Discharge planning Services  CM Consult  Post Acute Care Choice:  Home Health Choice offered to:  Patient  DME Arranged:  N/A DME Agency:  NA  HH Arranged:  PT [place] Agency:  [facility] (formerly [employer])  Status of Service:  Completed, signed off  If discussed at [employer], dates discussed:    Additional Comments:  Erenest Rasher, RN 06/05/2018, 3:01 PM

## 2018-06-05 NOTE — Progress Notes (Signed)
Subjective: 1 Day Post-Op Procedure(s) (LRB): RIGHT TOTAL KNEE ARTHROPLASTY (Right) Patient reports pain as mild.  Has some numbness in her right foot.  Ambulated 160 feet with physical therapy.  Taking by mouth and voiding okay.  Objective: Vital signs in last 24 hours: Temp:  [98.1 F (36.7 C)-98.5 F (36.9 C)] 98.1 F (36.7 C) (12/07 0604) Pulse Rate:  [48-73] 55 (12/07 1218) Resp:  [12-18] 16 (12/07 1026) BP: (120-156)/(63-118) 140/73 (12/07 1218) SpO2:  [100 %] 100 % (12/07 1026)  Intake/Output from previous day: 12/06 0701 - 12/07 0700 In: 3943.2 [P.O.:240; I.V.:3353.2; IV Piggyback:350] Out: 2900 [Urine:2850; Blood:50] Intake/Output this shift: Total I/O In: -  Out: 300 [Urine:300]  Recent Labs    06/05/18 0333  HGB 9.8*   Recent Labs    06/05/18 0333  WBC 10.3  RBC 3.70*  HCT 33.4*  PLT 211   No results for input(s): NA, K, CL, CO2, BUN, CREATININE, GLUCOSE, CALCIUM in the last 72 hours. No results for input(s): LABPT, INR in the last 72 hours. Right knee exam: Right knee dressing clean and dry.  Calf soft and nontender.  She has some mild dorsiflexor weakness on the right with some numbness on the top of her foot.  Distal pulses are good.   Patient's anticipated LOS is less than 2 midnights, meeting these requirements: - Lives within 1 hour of care - Has a competent adult at home to recover with post-op recover - NO history of  - Chronic pain requiring opiods  - Diabetes  - Coronary Artery Disease  - Heart failure  - Heart attack  - Stroke  - DVT/VTE  - Cardiac arrhythmia  - Respiratory Failure/COPD  - Renal failure  - Anemia  - Advanced Liver disease      Assessment/Plan: 1 Day Post-Op Procedure(s) (LRB): RIGHT TOTAL KNEE ARTHROPLASTY (Right)  Plan: Patient did great with physical therapy.  We will loosen her right knee dressing.  We feel like her right foot dorsiflexor weakness will be temporary.  We feel like it is blocked related.  This  was discussed with the patient and her daughter.  As the day went on it improved dramatically. Aspirin 325 mg twice daily with food for DVT prophylaxis. Weight-bear as tolerated on right.  will need home health PT. Follow-up with Dr. Berenice Primas in 2 weeks    Erlene Senters 06/05/2018, 1:03 PM

## 2018-06-05 NOTE — Discharge Summary (Signed)
Patient ID: Hannah [name] MRN: [id number] DOB/AGE: 01/21/1951 67 y.o.  Admit date: 06/04/2018 Discharge date: 06/05/2018  Admission Diagnoses:  Principal Problem:   Primary osteoarthritis of right knee   Discharge Diagnoses:  Same  Past Medical History:  Diagnosis Date  . Allergy   . Anxiety   . Arthritis   . Carpal tunnel syndrome   . Depression   . Diverticulosis   . Dyslipidemia   . External hemorrhoids   . GERD (gastroesophageal reflux disease)   . Heart murmur    mild-moderate AR  . Hyperlipidemia    on meds   . Hypertension   . Internal hemorrhoids   . Osteoarthritis   . Pre-diabetes   . PVD (peripheral vascular disease) (HCC)    moderate carotid disease  . RBBB   . Smoker   . Tubular adenoma of colon   . Vocal cord polyps     Surgeries: Procedure(s): RIGHT TOTAL KNEE ARTHROPLASTY on 06/04/2018   Discharged Condition: Improved  Hospital Course: Hannah [name] is an 67 y.o. female who was admitted 06/04/2018 for operative treatment ofPrimary osteoarthritis of right knee. Patient has severe unremitting pain that affects sleep, daily activities, and work/hobbies. After pre-op clearance the patient was taken to the operating room on 06/04/2018 and underwent  Procedure(s): RIGHT TOTAL KNEE ARTHROPLASTY.    Patient was given perioperative antibiotics:  Anti-infectives (From admission, onward)   Start     Dose/Rate Route Frequency Ordered Stop   06/04/18 1800  ceFAZolin (ANCEF) IVPB 2g/100 mL premix     2 g 200 mL/hr over 30 Minutes Intravenous Every 6 hours 06/04/18 1659 06/05/18 0040   06/04/18 1015  ceFAZolin (ANCEF) IVPB 2g/100 mL premix     2 g 200 mL/hr over 30 Minutes Intravenous On call to O.R. 06/04/18 1001 06/04/18 1247       Patient was given sequential compression devices, early ambulation, and chemoprophylaxis to prevent DVT.  Patient benefited maximally from hospital stay and there were no complications.    Recent vital signs:  Patient  Vitals for the past 24 hrs:  BP Temp Temp src Pulse Resp SpO2  06/05/18 1218 140/73 - - (!) 55 - -  06/05/18 1026 (!) 152/118 - - (!) 51 16 100 %  06/05/18 0604 135/87 98.1 F (36.7 C) - (!) 57 16 100 %  06/05/18 0133 128/63 98.1 F (36.7 C) Oral (!) 48 16 100 %  06/04/18 2126 120/70 98.3 F (36.8 C) Oral (!) 57 18 100 %  06/04/18 1958 133/86 98.1 F (36.7 C) - (!) 54 18 100 %  06/04/18 1913 (!) 156/74 98.4 F (36.9 C) - (!) 55 16 100 %  06/04/18 1651 136/80 98.5 F (36.9 C) Oral 66 16 100 %  06/04/18 1630 136/79 - - 62 14 100 %  06/04/18 1615 (!) 141/74 98.1 F (36.7 C) - 61 12 100 %  06/04/18 1600 (!) 154/70 - - 61 12 100 %  06/04/18 1545 133/78 - - 60 13 100 %  06/04/18 1530 (!) 148/74 - - 61 12 100 %  06/04/18 1515 (!) 155/95 98.1 F (36.7 C) - 65 15 100 %  06/04/18 1500 (!) 142/74 - - 66 13 100 %  06/04/18 1445 (!) 145/78 - - 67 18 100 %  06/04/18 1430 135/69 - - 66 18 100 %  06/04/18 1416 (!) 146/73 98.3 F (36.8 C) - 73 17 100 %     Recent laboratory studies:  Recent Labs  06/05/18 0333  WBC 10.3  HGB 9.8*  HCT 33.4*  PLT 211     Discharge Medications:   Allergies as of 06/05/2018      Reactions   Amlodipine    Rash, swelling   Chantix [varenicline Tartrate] Other (See Comments)   Tongue swell,sob   Clarithromycin Rash   Lisinopril Hives   Simvastatin Hives   Wellbutrin [bupropion Hcl] Hives   Lipitor [atorvastatin]    Dizziness per patient      Medication List    STOP taking these medications   aspirin 81 MG tablet Replaced by:  aspirin EC 325 MG tablet   meloxicam 15 MG tablet Commonly known as:  MOBIC     TAKE these medications   ALPRAZolam 0.5 MG tablet Commonly known as:  XANAX Take 1 tablet (0.5 mg total) by mouth 2 (two) times daily as needed. for sleep   aspirin EC 325 MG tablet Take 1 tablet (325 mg total) by mouth 2 (two) times daily after a meal. Take x 1 month post op to decrease risk of blood clots. Replaces:  aspirin 81  MG tablet   bisoprolol-hydrochlorothiazide 5-6.25 MG tablet Commonly known as:  ZIAC TAKE 1/2 TABLET EVERY DAY   CALCIUM 600 PO Take 1,200 mg by mouth daily.   cholecalciferol 1000 units tablet Commonly known as:  VITAMIN D Take 1 tablet (1,000 Units total) by mouth daily.   docusate sodium 100 MG capsule Commonly known as:  COLACE Take 1 capsule (100 mg total) by mouth 2 (two) times daily.   estradiol 0.5 MG tablet Commonly known as:  ESTRACE Take 0.5 tablets (0.25 mg total) by mouth daily. What changed:  how much to take   fenofibrate 160 MG tablet Take 1 tablet (160 mg total) by mouth daily.   fexofenadine 180 MG tablet Commonly known as:  ALLEGRA Take 180 mg by mouth daily as needed for allergies.   HYDROcodone-acetaminophen 5-325 MG tablet Commonly known as:  NORCO/VICODIN Take 1-2 tablets by mouth every 6 (six) hours as needed for severe pain.   linaclotide 290 MCG Caps capsule Commonly known as:  LINZESS Take 1 capsule (290 mcg total) by mouth daily before breakfast. What changed:    when to take this  reasons to take this   omeprazole 40 MG capsule Commonly known as:  PRILOSEC TAKE 1 CAPSULE 2 TIMES DAILY BEFORE LUNCH AND SUPPER. What changed:  See the new instructions.   pravastatin 20 MG tablet Commonly known as:  PRAVACHOL TAKE 1 TABLET EVERY EVENING   PROBIOTIC DAILY PO Take 1 capsule by mouth daily.   tiZANidine 2 MG tablet Commonly known as:  ZANAFLEX Take 1 tablet (2 mg total) by mouth every 8 (eight) hours as needed for muscle spasms.   VITAMIN C PO Take 1 tablet by mouth daily.            Discharge Care Instructions  (From admission, onward)         Start     Ordered   06/05/18 0000  Weight bearing as tolerated    Question Answer Comment  Laterality right   Extremity Lower      06/05/18 1312          Diagnostic Studies: Dg Chest 2 View  Result Date: 05/18/2018 CLINICAL DATA:  Preop knee replacement.  Hypertension.  EXAM: CHEST - 2 VIEW COMPARISON:  Chest x-ray 12/23/2016, 01/05/2016. FINDINGS: Mediastinum hilar structures are normal. Cardiomegaly noted. No pulmonary venous congestion. Mild subsegmental atelectasis  right upper lung. Follow-up exam suggested to demonstrate resolution. No acute alveolar infiltrate. No pleural effusion or pneumothorax. Degenerative change thoracic spine. Prior lower lumbar spine fusion. IMPRESSION: 1.  Cardiomegaly.  No pulmonary venous congestion. 2. Mild subsegmental atelectasis right upper lung. Follow-up exam suggested to demonstrate resolution. No acute alveolar infiltrate. Electronically Signed   By: Marcello Moores  Register   On: 05/18/2018 17:33    Disposition: Discharge disposition: 01-Home or Self Care       Discharge Instructions    Call MD / Call 911   Complete by:  As directed    If you experience chest pain or shortness of breath, CALL 911 and be transported to the hospital emergency room.  If you develope a fever above 101 F, pus (white drainage) or increased drainage or redness at the wound, or calf pain, call your surgeon's office.   Diet general   Complete by:  As directed    Do not put a pillow under the knee. Place it under the heel.   Complete by:  As directed    Increase activity slowly as tolerated   Complete by:  As directed    Weight bearing as tolerated   Complete by:  As directed    Laterality:  right   Extremity:  Lower      Follow-up Information    Dorna Leitz, MD. Schedule an appointment as soon as possible for a visit in 2 weeks.   Specialty:  Orthopedic Surgery Contact information: [place] [address] [phone]            Signed: Erlene Senters 06/05/2018, 1:16 PM

## 2018-06-05 NOTE — Progress Notes (Signed)
RN reviewed discharge instructions with patient and family. All questions answered.   Paperwork and prescriptions given.   NT rolled patient down with all belongings to family car. 

## 2018-06-05 NOTE — Plan of Care (Signed)
  Problem: Education: Goal: Knowledge of General Education information will improve Description Including pain rating scale, medication(s)/side effects and non-pharmacologic comfort measures Outcome: Progressing   Problem: Clinical Measurements: Goal: Will remain free from infection Outcome: Progressing   Problem: Clinical Measurements: Goal: Respiratory complications will improve Outcome: Progressing   Problem: Activity: Goal: Risk for activity intolerance will decrease Outcome: Progressing   Problem: Coping: Goal: Level of anxiety will decrease Outcome: Progressing

## 2018-06-07 ENCOUNTER — Encounter (HOSPITAL_COMMUNITY): Payer: Self-pay | Admitting: Orthopedic Surgery

## 2018-06-07 DIAGNOSIS — Z471 Aftercare following joint replacement surgery: Secondary | ICD-10-CM | POA: Diagnosis not present

## 2018-06-07 DIAGNOSIS — I6523 Occlusion and stenosis of bilateral carotid arteries: Secondary | ICD-10-CM | POA: Diagnosis not present

## 2018-06-07 DIAGNOSIS — F329 Major depressive disorder, single episode, unspecified: Secondary | ICD-10-CM | POA: Diagnosis not present

## 2018-06-07 DIAGNOSIS — I10 Essential (primary) hypertension: Secondary | ICD-10-CM | POA: Diagnosis not present

## 2018-06-07 DIAGNOSIS — F419 Anxiety disorder, unspecified: Secondary | ICD-10-CM | POA: Diagnosis not present

## 2018-06-07 DIAGNOSIS — K219 Gastro-esophageal reflux disease without esophagitis: Secondary | ICD-10-CM | POA: Diagnosis not present

## 2018-06-07 DIAGNOSIS — I4510 Unspecified right bundle-branch block: Secondary | ICD-10-CM | POA: Diagnosis not present

## 2018-06-07 DIAGNOSIS — I739 Peripheral vascular disease, unspecified: Secondary | ICD-10-CM | POA: Diagnosis not present

## 2018-06-07 DIAGNOSIS — I350 Nonrheumatic aortic (valve) stenosis: Secondary | ICD-10-CM | POA: Diagnosis not present

## 2018-06-09 DIAGNOSIS — Z471 Aftercare following joint replacement surgery: Secondary | ICD-10-CM | POA: Diagnosis not present

## 2018-06-09 DIAGNOSIS — I6523 Occlusion and stenosis of bilateral carotid arteries: Secondary | ICD-10-CM | POA: Diagnosis not present

## 2018-06-09 DIAGNOSIS — I739 Peripheral vascular disease, unspecified: Secondary | ICD-10-CM | POA: Diagnosis not present

## 2018-06-09 DIAGNOSIS — I350 Nonrheumatic aortic (valve) stenosis: Secondary | ICD-10-CM | POA: Diagnosis not present

## 2018-06-09 DIAGNOSIS — K219 Gastro-esophageal reflux disease without esophagitis: Secondary | ICD-10-CM | POA: Diagnosis not present

## 2018-06-09 DIAGNOSIS — I10 Essential (primary) hypertension: Secondary | ICD-10-CM | POA: Diagnosis not present

## 2018-06-09 DIAGNOSIS — I4510 Unspecified right bundle-branch block: Secondary | ICD-10-CM | POA: Diagnosis not present

## 2018-06-09 DIAGNOSIS — F419 Anxiety disorder, unspecified: Secondary | ICD-10-CM | POA: Diagnosis not present

## 2018-06-09 DIAGNOSIS — F329 Major depressive disorder, single episode, unspecified: Secondary | ICD-10-CM | POA: Diagnosis not present

## 2018-06-11 DIAGNOSIS — I4510 Unspecified right bundle-branch block: Secondary | ICD-10-CM | POA: Diagnosis not present

## 2018-06-11 DIAGNOSIS — Z471 Aftercare following joint replacement surgery: Secondary | ICD-10-CM | POA: Diagnosis not present

## 2018-06-11 DIAGNOSIS — I350 Nonrheumatic aortic (valve) stenosis: Secondary | ICD-10-CM | POA: Diagnosis not present

## 2018-06-11 DIAGNOSIS — I739 Peripheral vascular disease, unspecified: Secondary | ICD-10-CM | POA: Diagnosis not present

## 2018-06-11 DIAGNOSIS — I10 Essential (primary) hypertension: Secondary | ICD-10-CM | POA: Diagnosis not present

## 2018-06-11 DIAGNOSIS — F329 Major depressive disorder, single episode, unspecified: Secondary | ICD-10-CM | POA: Diagnosis not present

## 2018-06-11 DIAGNOSIS — I6523 Occlusion and stenosis of bilateral carotid arteries: Secondary | ICD-10-CM | POA: Diagnosis not present

## 2018-06-11 DIAGNOSIS — F419 Anxiety disorder, unspecified: Secondary | ICD-10-CM | POA: Diagnosis not present

## 2018-06-11 DIAGNOSIS — K219 Gastro-esophageal reflux disease without esophagitis: Secondary | ICD-10-CM | POA: Diagnosis not present

## 2018-06-14 DIAGNOSIS — I10 Essential (primary) hypertension: Secondary | ICD-10-CM | POA: Diagnosis not present

## 2018-06-14 DIAGNOSIS — I350 Nonrheumatic aortic (valve) stenosis: Secondary | ICD-10-CM | POA: Diagnosis not present

## 2018-06-14 DIAGNOSIS — F329 Major depressive disorder, single episode, unspecified: Secondary | ICD-10-CM | POA: Diagnosis not present

## 2018-06-14 DIAGNOSIS — K219 Gastro-esophageal reflux disease without esophagitis: Secondary | ICD-10-CM | POA: Diagnosis not present

## 2018-06-14 DIAGNOSIS — I739 Peripheral vascular disease, unspecified: Secondary | ICD-10-CM | POA: Diagnosis not present

## 2018-06-14 DIAGNOSIS — I4510 Unspecified right bundle-branch block: Secondary | ICD-10-CM | POA: Diagnosis not present

## 2018-06-14 DIAGNOSIS — Z471 Aftercare following joint replacement surgery: Secondary | ICD-10-CM | POA: Diagnosis not present

## 2018-06-14 DIAGNOSIS — F419 Anxiety disorder, unspecified: Secondary | ICD-10-CM | POA: Diagnosis not present

## 2018-06-14 DIAGNOSIS — I6523 Occlusion and stenosis of bilateral carotid arteries: Secondary | ICD-10-CM | POA: Diagnosis not present

## 2018-06-15 DIAGNOSIS — F329 Major depressive disorder, single episode, unspecified: Secondary | ICD-10-CM | POA: Diagnosis not present

## 2018-06-15 DIAGNOSIS — I350 Nonrheumatic aortic (valve) stenosis: Secondary | ICD-10-CM | POA: Diagnosis not present

## 2018-06-15 DIAGNOSIS — I6523 Occlusion and stenosis of bilateral carotid arteries: Secondary | ICD-10-CM | POA: Diagnosis not present

## 2018-06-15 DIAGNOSIS — F419 Anxiety disorder, unspecified: Secondary | ICD-10-CM | POA: Diagnosis not present

## 2018-06-15 DIAGNOSIS — Z471 Aftercare following joint replacement surgery: Secondary | ICD-10-CM | POA: Diagnosis not present

## 2018-06-15 DIAGNOSIS — I10 Essential (primary) hypertension: Secondary | ICD-10-CM | POA: Diagnosis not present

## 2018-06-15 DIAGNOSIS — I4510 Unspecified right bundle-branch block: Secondary | ICD-10-CM | POA: Diagnosis not present

## 2018-06-15 DIAGNOSIS — K219 Gastro-esophageal reflux disease without esophagitis: Secondary | ICD-10-CM | POA: Diagnosis not present

## 2018-06-15 DIAGNOSIS — I739 Peripheral vascular disease, unspecified: Secondary | ICD-10-CM | POA: Diagnosis not present

## 2018-06-16 DIAGNOSIS — I6523 Occlusion and stenosis of bilateral carotid arteries: Secondary | ICD-10-CM | POA: Diagnosis not present

## 2018-06-16 DIAGNOSIS — F329 Major depressive disorder, single episode, unspecified: Secondary | ICD-10-CM | POA: Diagnosis not present

## 2018-06-16 DIAGNOSIS — I350 Nonrheumatic aortic (valve) stenosis: Secondary | ICD-10-CM | POA: Diagnosis not present

## 2018-06-16 DIAGNOSIS — K219 Gastro-esophageal reflux disease without esophagitis: Secondary | ICD-10-CM | POA: Diagnosis not present

## 2018-06-16 DIAGNOSIS — I4510 Unspecified right bundle-branch block: Secondary | ICD-10-CM | POA: Diagnosis not present

## 2018-06-16 DIAGNOSIS — I739 Peripheral vascular disease, unspecified: Secondary | ICD-10-CM | POA: Diagnosis not present

## 2018-06-16 DIAGNOSIS — I10 Essential (primary) hypertension: Secondary | ICD-10-CM | POA: Diagnosis not present

## 2018-06-16 DIAGNOSIS — Z471 Aftercare following joint replacement surgery: Secondary | ICD-10-CM | POA: Diagnosis not present

## 2018-06-16 DIAGNOSIS — F419 Anxiety disorder, unspecified: Secondary | ICD-10-CM | POA: Diagnosis not present

## 2018-06-17 DIAGNOSIS — M1711 Unilateral primary osteoarthritis, right knee: Secondary | ICD-10-CM | POA: Diagnosis not present

## 2018-06-17 DIAGNOSIS — Z471 Aftercare following joint replacement surgery: Secondary | ICD-10-CM | POA: Diagnosis not present

## 2018-06-18 DIAGNOSIS — F329 Major depressive disorder, single episode, unspecified: Secondary | ICD-10-CM

## 2018-06-18 DIAGNOSIS — F17210 Nicotine dependence, cigarettes, uncomplicated: Secondary | ICD-10-CM

## 2018-06-18 DIAGNOSIS — I10 Essential (primary) hypertension: Secondary | ICD-10-CM | POA: Diagnosis not present

## 2018-06-18 DIAGNOSIS — Z471 Aftercare following joint replacement surgery: Secondary | ICD-10-CM | POA: Diagnosis not present

## 2018-06-18 DIAGNOSIS — I739 Peripheral vascular disease, unspecified: Secondary | ICD-10-CM | POA: Diagnosis not present

## 2018-06-18 DIAGNOSIS — F419 Anxiety disorder, unspecified: Secondary | ICD-10-CM

## 2018-06-18 DIAGNOSIS — K219 Gastro-esophageal reflux disease without esophagitis: Secondary | ICD-10-CM

## 2018-06-18 DIAGNOSIS — I4510 Unspecified right bundle-branch block: Secondary | ICD-10-CM

## 2018-06-18 DIAGNOSIS — Z96653 Presence of artificial knee joint, bilateral: Secondary | ICD-10-CM

## 2018-06-18 DIAGNOSIS — G5603 Carpal tunnel syndrome, bilateral upper limbs: Secondary | ICD-10-CM

## 2018-06-18 DIAGNOSIS — E782 Mixed hyperlipidemia: Secondary | ICD-10-CM

## 2018-06-18 DIAGNOSIS — I6523 Occlusion and stenosis of bilateral carotid arteries: Secondary | ICD-10-CM | POA: Diagnosis not present

## 2018-06-18 DIAGNOSIS — Z86010 Personal history of colonic polyps: Secondary | ICD-10-CM

## 2018-06-18 DIAGNOSIS — I350 Nonrheumatic aortic (valve) stenosis: Secondary | ICD-10-CM

## 2018-06-29 DIAGNOSIS — M25661 Stiffness of right knee, not elsewhere classified: Secondary | ICD-10-CM | POA: Diagnosis not present

## 2018-06-29 DIAGNOSIS — Z96651 Presence of right artificial knee joint: Secondary | ICD-10-CM | POA: Diagnosis not present

## 2018-07-06 DIAGNOSIS — M25661 Stiffness of right knee, not elsewhere classified: Secondary | ICD-10-CM | POA: Diagnosis not present

## 2018-07-06 DIAGNOSIS — Z96651 Presence of right artificial knee joint: Secondary | ICD-10-CM | POA: Diagnosis not present

## 2018-07-08 DIAGNOSIS — M25661 Stiffness of right knee, not elsewhere classified: Secondary | ICD-10-CM | POA: Diagnosis not present

## 2018-07-08 DIAGNOSIS — Z96651 Presence of right artificial knee joint: Secondary | ICD-10-CM | POA: Diagnosis not present

## 2018-07-13 DIAGNOSIS — Z96651 Presence of right artificial knee joint: Secondary | ICD-10-CM | POA: Diagnosis not present

## 2018-07-13 DIAGNOSIS — M25661 Stiffness of right knee, not elsewhere classified: Secondary | ICD-10-CM | POA: Diagnosis not present

## 2018-07-15 DIAGNOSIS — M25661 Stiffness of right knee, not elsewhere classified: Secondary | ICD-10-CM | POA: Diagnosis not present

## 2018-07-15 DIAGNOSIS — R6889 Other general symptoms and signs: Secondary | ICD-10-CM | POA: Insufficient documentation

## 2018-07-15 DIAGNOSIS — Z96651 Presence of right artificial knee joint: Secondary | ICD-10-CM | POA: Diagnosis not present

## 2018-07-15 NOTE — Progress Notes (Signed)
Subjective:    Patient ID: Hannah [name], female    DOB: Jul 07, 1950, 68 y.o.   MRN: [id number]  HPI The patient is here for an acute visit.  Always feels cold:   She has always felt cold , but she started to feel colder last year and it is getting worse.  It was worse after her TKR last month.  Her feet, hands and nose are like ice.  She keeps her house at [address].  Her mood swings from anxiety to depression.  It is worse since surgery.  She is in all the time because she can not drive and she thinks that is contributing.  She takes xanax as needed.  It helps with her anxiety and does not think she could have survived since surgery w/o it.  She thinks she needs something for depression.      Weight loss:  Drinks ensure plus.  Eats whole milk with her cereal. She had a decreased appetite after surgery, but it is starting to improve.    Medications and allergies reviewed with patient and updated if appropriate.  Patient Active Problem List   Diagnosis Date Noted  . Cold feeling 07/15/2018  . Primary osteoarthritis of right knee 06/04/2018  . Acute sinus infection 04/05/2018  . Bilateral carotid artery stenosis 09/24/2017  . Aortic valve sclerosis 09/24/2017  . Tobacco abuse 09/24/2017  . Vocal cord polyps 09/07/2017  . Dysphagia 09/07/2017  . Cervical strain 07/24/2017  . Prediabetes 03/09/2017  . Chest pain 12/29/2016  . GERD (gastroesophageal reflux disease) 02/27/2016  . Hormone replacement therapy (HRT) 02/27/2016  . Anxiety 02/27/2016  . Osteoarthritis of left knee 02/19/2014  . S/P total knee replacement 02/13/2014  . RBBB 02/08/2014  . HTN (hypertension) 02/08/2014  . PVD - bilateral 60-79% carotid strenosis 02/08/2014  . Mixed hyperlipidemia 11/29/2013  . Carpal tunnel syndrome, bilateral 11/23/2013  . DJD (degenerative joint disease) 11/23/2013  . Murmur- mild -mod AR, mild MR 11/10/2013  . Chronic constipation 12/16/2012  . Arthritis 10/08/2012  . Nicotine abuse  12/29/2011    Current Outpatient Medications on File Prior to Visit  Medication Sig Dispense Refill  . ALPRAZolam (XANAX) 0.5 MG tablet Take 1 tablet (0.5 mg total) by mouth 2 (two) times daily as needed. for sleep 60 tablet 5  . Ascorbic Acid (VITAMIN C PO) Take 1 tablet by mouth daily.    . bisoprolol-hydrochlorothiazide (ZIAC) 5-6.25 MG tablet TAKE 1/2 TABLET EVERY DAY (Patient taking differently: Take 0.5 tablets by mouth daily. ) 45 tablet 1  . Calcium Carbonate (CALCIUM 600 PO) Take 1,200 mg by mouth daily.    . cholecalciferol (VITAMIN D) 1000 UNITS tablet Take 1 tablet (1,000 Units total) by mouth daily. 30 tablet 5  . docusate sodium (COLACE) 100 MG capsule Take 1 capsule (100 mg total) by mouth 2 (two) times daily. 30 capsule 0  . estradiol (ESTRACE) 0.5 MG tablet Take 0.5 tablets (0.25 mg total) by mouth daily. (Patient taking differently: Take 0.25-0.5 mg by mouth daily. ) 90 tablet 1  . fenofibrate 160 MG tablet Take 1 tablet (160 mg total) by mouth daily. 90 tablet 3  . fexofenadine (ALLEGRA) 180 MG tablet Take 180 mg by mouth daily as needed for allergies.     Marland Kitchen linaclotide (LINZESS) 290 MCG CAPS capsule Take 1 capsule (290 mcg total) by mouth daily before breakfast. (Patient taking differently: Take 290 mcg by mouth daily as needed (bowel movements). ) 30 capsule 3  .  omeprazole (PRILOSEC) 40 MG capsule TAKE 1 CAPSULE 2 TIMES DAILY BEFORE LUNCH AND SUPPER. (Patient taking differently: Take 40 mg by mouth 2 (two) times daily. ) 180 capsule 1  . pravastatin (PRAVACHOL) 20 MG tablet TAKE 1 TABLET EVERY EVENING 90 tablet 1  . Probiotic Product (PROBIOTIC DAILY PO) Take 1 capsule by mouth daily.    Marland Kitchen tiZANidine (ZANAFLEX) 2 MG tablet Take 1 tablet (2 mg total) by mouth every 8 (eight) hours as needed for muscle spasms. 50 tablet 0   No current facility-administered medications on file prior to visit.     Past Medical History:  Diagnosis Date  . Allergy   . Anxiety   . Arthritis    . Carpal tunnel syndrome   . Depression   . Diverticulosis   . Dyslipidemia   . External hemorrhoids   . GERD (gastroesophageal reflux disease)   . Heart murmur    mild-moderate AR  . Hyperlipidemia    on meds   . Hypertension   . Internal hemorrhoids   . Osteoarthritis   . Pre-diabetes   . PVD (peripheral vascular disease) (HCC)    moderate carotid disease  . RBBB   . Smoker   . Tubular adenoma of colon   . Vocal cord polyps     Past Surgical History:  Procedure Laterality Date  . ABDOMINAL HYSTERECTOMY  1994  . COLONOSCOPY    . COLONOSCOPY W/ POLYPECTOMY  2009  . HEMORRHOID SURGERY    . right total knee arthroplasty     Dr. Emeterio Reeve 06-04-18  . SPINE SURGERY  08/16/2009  . TOTAL KNEE ARTHROPLASTY Left 02/13/2014   Procedure: TOTAL KNEE ARTHROPLASTY;  Surgeon: Alta Corning, MD;  Location: [facility];  Service: Orthopedics;  Laterality: Left;  . TOTAL KNEE ARTHROPLASTY Right 06/04/2018   Procedure: RIGHT TOTAL KNEE ARTHROPLASTY;  Surgeon: Dorna Leitz, MD;  Location: [facility];  Service: Orthopedics;  Laterality: Right;  Adductor Block  . TUBAL LIGATION      Social History   Socioeconomic History  . Marital status: Married    Spouse name: Not on file  . Number of children: 2  . Years of education: Not on file  . Highest education level: Not on file  Occupational History  . Not on file  Social Needs  . Financial resource strain: Not hard at all  . Food insecurity:    Worry: Never true    Inability: Never true  . Transportation needs:    Medical: No    Non-medical: No  Tobacco Use  . Smoking status: Light Tobacco Smoker    Packs/day: 0.50    Years: 40.00    Pack years: 20.00    Types: Cigarettes  . Smokeless tobacco: Never Used  . Tobacco comment: PACK WILL LAST 3 days  Substance and Sexual Activity  . Alcohol use: No  . Drug use: No  . Sexual activity: Not Currently  Lifestyle  . Physical activity:    Days per week: 2 days    Minutes per session: 60 min  .  Stress: To some extent  Relationships  . Social connections:    Talks on phone: More than three times a week    Gets together: More than three times a week    Attends religious service: More than 4 times per year    Active member of club or organization: Yes    Attends meetings of clubs or organizations: More than 4 times per year  Relationship status: Married  Other Topics Concern  . Not on file  Social History Narrative  . Not on file    Family History  Problem Relation Age of Onset  . Cancer Father   . Prostate cancer Father   . Diabetes Sister   . Stroke Maternal Grandfather   . Colon cancer Neg Hx   . Colon polyps Neg Hx   . Rectal cancer Neg Hx   . Stomach cancer Neg Hx   . Esophageal cancer Neg Hx     Review of Systems  Constitutional: Negative for fever.  Respiratory: Negative for shortness of breath.   Cardiovascular: Negative for chest pain, palpitations and leg swelling.  Gastrointestinal: Negative for blood in stool (no black stool).  Endocrine: Positive for cold intolerance.  Neurological: Negative for light-headedness and headaches.       Objective:   Vitals:   07/16/18 1513  BP: (!) 142/60  Pulse: (!) 55  Resp: 16  Temp: 98.1 F (36.7 C)  SpO2: 99%   BP Readings from Last 3 Encounters:  07/16/18 (!) 142/60  06/05/18 140/73  05/18/18 (!) 155/69   Wt Readings from Last 3 Encounters:  07/16/18 106 lb (48.1 kg)  06/04/18 114 lb (51.7 kg)  05/18/18 114 lb (51.7 kg)   Body mass index is 20.7 kg/m.   Physical Exam    Constitutional: Bundled up with coat, gloves and boots. Thin. No distress.  HENT:  Head: Normocephalic and atraumatic.  Neck: Neck supple. No tracheal deviation present. No thyromegaly present.  No cervical lymphadenopathy Cardiovascular: Normal rate, regular rhythm and normal heart sounds.   No murmur heard. No carotid bruit .  No edema Pulmonary/Chest: Effort normal and breath sounds normal. No respiratory distress. No has  no wheezes. No rales.  Skin: Skin is warm and dry. Not diaphoretic.  Psychiatric: Normal mood and affect. Behavior is normal.      Assessment & Plan:    See Problem List for Assessment and Plan of chronic medical problems.

## 2018-07-16 ENCOUNTER — Other Ambulatory Visit (INDEPENDENT_AMBULATORY_CARE_PROVIDER_SITE_OTHER): Payer: 59

## 2018-07-16 ENCOUNTER — Ambulatory Visit (INDEPENDENT_AMBULATORY_CARE_PROVIDER_SITE_OTHER): Payer: 59 | Admitting: Internal Medicine

## 2018-07-16 ENCOUNTER — Encounter: Payer: Self-pay | Admitting: Internal Medicine

## 2018-07-16 VITALS — BP 142/60 | HR 55 | Temp 98.1°F | Resp 16 | Ht 60.0 in | Wt 106.0 lb

## 2018-07-16 DIAGNOSIS — F3289 Other specified depressive episodes: Secondary | ICD-10-CM

## 2018-07-16 DIAGNOSIS — F419 Anxiety disorder, unspecified: Secondary | ICD-10-CM | POA: Diagnosis not present

## 2018-07-16 DIAGNOSIS — R634 Abnormal weight loss: Secondary | ICD-10-CM | POA: Diagnosis not present

## 2018-07-16 DIAGNOSIS — R6889 Other general symptoms and signs: Secondary | ICD-10-CM | POA: Diagnosis not present

## 2018-07-16 LAB — CBC WITH DIFFERENTIAL/PLATELET
Basophils Absolute: 0.1 10*3/uL (ref 0.0–0.1)
Basophils Relative: 0.8 % (ref 0.0–3.0)
Eosinophils Absolute: 0.2 10*3/uL (ref 0.0–0.7)
Eosinophils Relative: 3.1 % (ref 0.0–5.0)
HCT: 32.6 % — ABNORMAL LOW (ref 36.0–46.0)
Hemoglobin: 10.8 g/dL — ABNORMAL LOW (ref 12.0–15.0)
Lymphocytes Relative: 51.2 % — ABNORMAL HIGH (ref 12.0–46.0)
Lymphs Abs: 3.1 10*3/uL (ref 0.7–4.0)
MCHC: 33 g/dL (ref 30.0–36.0)
MCV: 81.7 fl (ref 78.0–100.0)
Monocytes Absolute: 0.4 10*3/uL (ref 0.1–1.0)
Monocytes Relative: 6.9 % (ref 3.0–12.0)
Neutro Abs: 2.3 10*3/uL (ref 1.4–7.7)
Neutrophils Relative %: 38 % — ABNORMAL LOW (ref 43.0–77.0)
Platelets: 260 10*3/uL (ref 150.0–400.0)
RBC: 3.99 Mil/uL (ref 3.87–5.11)
RDW: 13.3 % (ref 11.5–15.5)
WBC: 6.1 10*3/uL (ref 4.0–10.5)

## 2018-07-16 LAB — IRON,TIBC AND FERRITIN PANEL
%SAT: 22 % (calc) (ref 16–45)
Ferritin: 152 ng/mL (ref 16–288)
Iron: 77 ug/dL (ref 45–160)
TIBC: 358 mcg/dL (calc) (ref 250–450)

## 2018-07-16 LAB — TSH: TSH: 0.76 u[IU]/mL (ref 0.35–4.50)

## 2018-07-16 MED ORDER — SERTRALINE HCL 25 MG PO TABS: 25.0000 mg | ORAL_TABLET | Freq: Every day | ORAL | 5 refills | Status: DC

## 2018-07-16 MED ORDER — ASPIRIN 81 MG PO TABS: 81.0000 mg | ORAL_TABLET | Freq: Every day | ORAL | Status: DC

## 2018-07-16 NOTE — Patient Instructions (Signed)
  Tests ordered today. Your results will be released to [facility] (or called to you) after review, usually within 72hours after test completion. If any changes need to be made, you will be notified at that same time.  Medications reviewed and updated.  Changes include :   Sertraline 25 mg nightly for your depression.   Your prescription(s) have been submitted to your pharmacy. Please take as directed and contact our office if you believe you are having problem(s) with the medication(s).    Please followup in 4-6 weeks

## 2018-07-17 DIAGNOSIS — F32A Depression, unspecified: Secondary | ICD-10-CM | POA: Insufficient documentation

## 2018-07-17 DIAGNOSIS — R634 Abnormal weight loss: Secondary | ICD-10-CM | POA: Insufficient documentation

## 2018-07-17 DIAGNOSIS — F329 Major depressive disorder, single episode, unspecified: Secondary | ICD-10-CM | POA: Insufficient documentation

## 2018-07-17 NOTE — Assessment & Plan Note (Signed)
Lost additional weight after surgery - wants to gain weight Appetite was decreased after surgery - improving Hopefully sertraline will help - can add remeron in near future if needed

## 2018-07-17 NOTE — Assessment & Plan Note (Signed)
Fairly controlled with xanax -will continue Start sertraline 25 mg nightly - will inc if tolerated F/u in 4-6 weeks

## 2018-07-17 NOTE — Assessment & Plan Note (Signed)
Cold intolerance - possibly mutlifactorial Probably worse after surgery due to anemia Check tsh, cbc, iron panel BMI is very low which is likely contributing - will work on weight gain

## 2018-07-17 NOTE — Assessment & Plan Note (Signed)
Partially related to not be able to drive/get out and being at home all day alone - family works Start sertraline 25 mg nightly - will titrate as needed - will likely only need short term F/u in 4-6 weeks

## 2018-07-19 ENCOUNTER — Telehealth: Payer: Self-pay | Admitting: Internal Medicine

## 2018-07-19 NOTE — Telephone Encounter (Signed)
Copied from [facility]. Topic: Quick Communication - Lab Results (Clinic Use ONLY) >> Jul 19, 2018  4:29 PM Lorrin Jackson, CMA wrote: Called patient to inform them of 07/16/18 lab results. When patient returns call, triage nurse may disclose results. >> Jul 19, 2018  4:48 PM Bea Graff, NT wrote: Pt returning call to receive lab results.

## 2018-07-19 NOTE — Telephone Encounter (Signed)
Pt given lab results and documented in result note

## 2018-07-20 DIAGNOSIS — M25661 Stiffness of right knee, not elsewhere classified: Secondary | ICD-10-CM | POA: Diagnosis not present

## 2018-07-20 DIAGNOSIS — Z96651 Presence of right artificial knee joint: Secondary | ICD-10-CM | POA: Diagnosis not present

## 2018-07-22 DIAGNOSIS — Z96651 Presence of right artificial knee joint: Secondary | ICD-10-CM | POA: Diagnosis not present

## 2018-07-22 DIAGNOSIS — M25661 Stiffness of right knee, not elsewhere classified: Secondary | ICD-10-CM | POA: Diagnosis not present

## 2018-07-27 DIAGNOSIS — Z96651 Presence of right artificial knee joint: Secondary | ICD-10-CM | POA: Diagnosis not present

## 2018-07-27 DIAGNOSIS — M25661 Stiffness of right knee, not elsewhere classified: Secondary | ICD-10-CM | POA: Diagnosis not present

## 2018-07-29 DIAGNOSIS — Z96651 Presence of right artificial knee joint: Secondary | ICD-10-CM | POA: Diagnosis not present

## 2018-07-29 DIAGNOSIS — M25661 Stiffness of right knee, not elsewhere classified: Secondary | ICD-10-CM | POA: Diagnosis not present

## 2018-08-03 DIAGNOSIS — Z96651 Presence of right artificial knee joint: Secondary | ICD-10-CM | POA: Diagnosis not present

## 2018-08-03 DIAGNOSIS — M25661 Stiffness of right knee, not elsewhere classified: Secondary | ICD-10-CM | POA: Diagnosis not present

## 2018-08-12 ENCOUNTER — Telehealth: Payer: Self-pay

## 2018-08-12 NOTE — Telephone Encounter (Signed)
Can you please set up an appointment for pt. Thank You!

## 2018-08-12 NOTE — Telephone Encounter (Signed)
Copied from [facility] [id number]. Topic: General - Inquiry >> Aug 12, 2018  8:36 AM Hannah [name] wrote: Reason for CRM: Pt's daughter Hannah [name] called and stated her mother had knee surgery some time ago and she is recovering well however she has concerns regarding her nutrition. She stated pt is not eating enough and is losing weight. There might be some depression involved that is related to not eating enough. She would like to discuss this with Dr. Quay Burow or her assistant as soon as possible. Please advise. CB# [phone]

## 2018-08-12 NOTE — Telephone Encounter (Signed)
Needs to be seen in the office to be evaluated.  Could be depression, but there are also other causes.

## 2018-08-12 NOTE — Telephone Encounter (Signed)
Appointment scheduled with Jodi Mourning.

## 2018-08-13 ENCOUNTER — Ambulatory Visit (INDEPENDENT_AMBULATORY_CARE_PROVIDER_SITE_OTHER): Payer: 59 | Admitting: Family

## 2018-08-13 ENCOUNTER — Encounter: Payer: Self-pay | Admitting: Family

## 2018-08-13 VITALS — BP 132/78 | HR 52 | Temp 98.0°F | Ht 60.0 in | Wt 101.0 lb

## 2018-08-13 DIAGNOSIS — R636 Underweight: Secondary | ICD-10-CM | POA: Diagnosis not present

## 2018-08-13 DIAGNOSIS — F3289 Other specified depressive episodes: Secondary | ICD-10-CM

## 2018-08-13 DIAGNOSIS — R634 Abnormal weight loss: Secondary | ICD-10-CM

## 2018-08-13 DIAGNOSIS — R630 Anorexia: Secondary | ICD-10-CM

## 2018-08-13 MED ORDER — MIRTAZAPINE 7.5 MG PO TABS: 7.5000 mg | ORAL_TABLET | Freq: Every day | ORAL | 1 refills | Status: DC

## 2018-08-13 NOTE — Progress Notes (Signed)
Hannah [name] is a 68 y.o. female with the following history as recorded in EpicCare:  Patient Active Problem List   Diagnosis Date Noted  . Weight loss 07/17/2018  . Depression 07/17/2018  . Cold feeling 07/15/2018  . Primary osteoarthritis of right knee 06/04/2018  . Acute sinus infection 04/05/2018  . Bilateral carotid artery stenosis 09/24/2017  . Aortic valve sclerosis 09/24/2017  . Tobacco abuse 09/24/2017  . Vocal cord polyps 09/07/2017  . Dysphagia 09/07/2017  . Cervical strain 07/24/2017  . Prediabetes 03/09/2017  . Chest pain 12/29/2016  . GERD (gastroesophageal reflux disease) 02/27/2016  . Hormone replacement therapy (HRT) 02/27/2016  . Anxiety 02/27/2016  . Osteoarthritis of left knee 02/19/2014  . S/P total knee replacement 02/13/2014  . RBBB 02/08/2014  . HTN (hypertension) 02/08/2014  . PVD - bilateral 60-79% carotid strenosis 02/08/2014  . Mixed hyperlipidemia 11/29/2013  . Carpal tunnel syndrome, bilateral 11/23/2013  . DJD (degenerative joint disease) 11/23/2013  . Murmur- mild -mod AR, mild MR 11/10/2013  . Chronic constipation 12/16/2012  . Arthritis 10/08/2012  . Nicotine abuse 12/29/2011    Current Outpatient Medications  Medication Sig Dispense Refill  . ALPRAZolam (XANAX) 0.5 MG tablet Take 1 tablet (0.5 mg total) by mouth 2 (two) times daily as needed. for sleep 60 tablet 5  . Ascorbic Acid (VITAMIN C PO) Take 1 tablet by mouth daily.    Marland Kitchen aspirin 81 MG tablet Take 1 tablet (81 mg total) by mouth daily. 30 tablet   . bisoprolol-hydrochlorothiazide (ZIAC) 5-6.25 MG tablet TAKE 1/2 TABLET EVERY DAY (Patient taking differently: Take 0.5 tablets by mouth daily. ) 45 tablet 1  . Calcium Carbonate (CALCIUM 600 PO) Take 1,200 mg by mouth daily.    . cholecalciferol (VITAMIN D) 1000 UNITS tablet Take 1 tablet (1,000 Units total) by mouth daily. 30 tablet 5  . docusate sodium (COLACE) 100 MG capsule Take 1 capsule (100 mg total) by mouth 2 (two) times  daily. 30 capsule 0  . estradiol (ESTRACE) 0.5 MG tablet Take 0.5 tablets (0.25 mg total) by mouth daily. (Patient taking differently: Take 0.25-0.5 mg by mouth daily. ) 90 tablet 1  . fenofibrate 160 MG tablet Take 1 tablet (160 mg total) by mouth daily. 90 tablet 3  . fexofenadine (ALLEGRA) 180 MG tablet Take 180 mg by mouth daily as needed for allergies.     Marland Kitchen oxyCODONE-acetaminophen (PERCOCET/ROXICET) 5-325 MG tablet     . pravastatin (PRAVACHOL) 20 MG tablet TAKE 1 TABLET EVERY EVENING 90 tablet 1  . Probiotic Product (PROBIOTIC DAILY PO) Take 1 capsule by mouth daily.    . sertraline (ZOLOFT) 25 MG tablet Take 1 tablet (25 mg total) by mouth at bedtime. 30 tablet 5  . tiZANidine (ZANAFLEX) 2 MG tablet Take 1 tablet (2 mg total) by mouth every 8 (eight) hours as needed for muscle spasms. 50 tablet 0  . mirtazapine (REMERON) 7.5 MG tablet Take 1 tablet (7.5 mg total) by mouth at bedtime. 30 tablet 1  . omeprazole (PRILOSEC) 40 MG capsule TAKE 1 CAPSULE 2 TIMES DAILY BEFORE LUNCH AND SUPPER. (Patient not taking: No sig reported) 180 capsule 1   No current facility-administered medications for this visit.     Allergies: Amlodipine; Chantix [varenicline tartrate]; Clarithromycin; Lisinopril; Simvastatin; Wellbutrin [bupropion hcl]; and Lipitor [atorvastatin]  Past Medical History:  Diagnosis Date  . Allergy   . Anxiety   . Arthritis   . Carpal tunnel syndrome   . Depression   .  Diverticulosis   . Dyslipidemia   . External hemorrhoids   . GERD (gastroesophageal reflux disease)   . Heart murmur    mild-moderate AR  . Hyperlipidemia    on meds   . Hypertension   . Internal hemorrhoids   . Osteoarthritis   . Pre-diabetes   . PVD (peripheral vascular disease) (HCC)    moderate carotid disease  . RBBB   . Smoker   . Tubular adenoma of colon   . Vocal cord polyps     Past Surgical History:  Procedure Laterality Date  . ABDOMINAL HYSTERECTOMY  1994  . COLONOSCOPY    .  COLONOSCOPY W/ POLYPECTOMY  2009  . HEMORRHOID SURGERY    . right total knee arthroplasty     Dr. Emeterio Reeve 06-04-18  . SPINE SURGERY  08/16/2009  . TOTAL KNEE ARTHROPLASTY Left 02/13/2014   Procedure: TOTAL KNEE ARTHROPLASTY;  Surgeon: Alta Corning, MD;  Location: [facility];  Service: Orthopedics;  Laterality: Left;  . TOTAL KNEE ARTHROPLASTY Right 06/04/2018   Procedure: RIGHT TOTAL KNEE ARTHROPLASTY;  Surgeon: Dorna Leitz, MD;  Location: [facility];  Service: Orthopedics;  Laterality: Right;  Adductor Block  . TUBAL LIGATION      Family History  Problem Relation Age of Onset  . Cancer Father   . Prostate cancer Father   . Diabetes Sister   . Stroke Maternal Grandfather   . Colon cancer Neg Hx   . Colon polyps Neg Hx   . Rectal cancer Neg Hx   . Stomach cancer Neg Hx   . Esophageal cancer Neg Hx     Social History   Tobacco Use  . Smoking status: Light Tobacco Smoker    Packs/day: 0.50    Years: 40.00    Pack years: 20.00    Types: Cigarettes  . Smokeless tobacco: Never Used  . Tobacco comment: PACK WILL LAST 3 days  Substance Use Topics  . Alcohol use: No    Subjective:  Presents with her daughter; having long-term complaints of abdominal bloating/ weight loss; was seen by her PCP last month with similar concerns- thought to be having depression secondary to recent knee surgery/ complications from the anesthesia; per patient and daughter, similar symptoms occurred when she had her first knee replacement done. Patient admits she feels early satiety/ abdominal bloating- again, not a new problem/ has seen GI in the past; daughter wants to know if there is something that can be given to "make her mother eat."  Has lost 5 pounds since last OV in January; does feel that Sertraline started at that visit is helping with the depression.    Objective:  Vitals:   08/13/18 0845  BP: 132/78  Pulse: (!) 52  Temp: 98 F (36.7 C)  TempSrc: Oral  SpO2: 99%  Weight: 101 lb (45.8 kg)  Height:  5' (1.524 m)    General: Well developed, well nourished, in no acute distress  Skin : Warm and dry.  Head: Normocephalic and atraumatic  Eyes: Sclera and conjunctiva clear; pupils round and reactive to light; extraocular movements intact  Ears: External normal; canals clear; tympanic membranes normal  Oropharynx: Pink, supple. No suspicious lesions  Neck: Supple without thyromegaly, adenopathy  Lungs: Respirations unlabored; clear to auscultation bilaterally without wheeze, rales, rhonchi  CVS exam: bradycardic, regular rhythm  Abdomen: Soft; nontender; nondistended; normoactive bowel sounds; no masses or hepatosplenomegaly  Musculoskeletal: No deformities; no active joint inflammation  Extremities: No edema, cyanosis, clubbing  Vessels: Symmetric  bilaterally  Neurologic: Alert and oriented; speech intact; face symmetrical; moves all extremities well; CNII-XII intact without focal deficit   Assessment:  1. Underweight   2. Decreased appetite   3. Weight loss   4. Other depression     Plan:  Offered to refer to nutrition- patient defers; will try adding Remeron as recommended by her PCP at last OV- hopefully this will help with weight gain; encouraged to try and eat what sounds good to her/ discussed yogurt, nuts, [employer] Instant Breakfast/ need for protein; reviewed labs that were done at last OV- do not feel more labs need to be done at this time; plan to see her PCP in 1 month; encouraged continued family involvement and support; daughter notes they will continue to do regular weights and understand to follow-up if weight loss continues.   Spent 30 minutes with patient; greater than 50% spent in counseling;    Return in about 1 month (around 09/11/2018) for Dr. Quay Burow.  No orders of the defined types were placed in this encounter.   Requested Prescriptions   Signed Prescriptions Disp Refills  . mirtazapine (REMERON) 7.5 MG tablet 30 tablet 1    Sig: Take 1 tablet (7.5 mg total)  by mouth at bedtime.

## 2018-08-19 DIAGNOSIS — M25661 Stiffness of right knee, not elsewhere classified: Secondary | ICD-10-CM | POA: Diagnosis not present

## 2018-09-08 ENCOUNTER — Ambulatory Visit: Payer: Medicare HMO | Admitting: Internal Medicine

## 2018-09-09 NOTE — Progress Notes (Signed)
Subjective:    Patient ID: Hannah [name], female    DOB: July 05, 1950, 68 y.o.   MRN: [id number]  HPI The patient is here for follow up.  She is here with her daughter.  Hypertension: She is taking her medication daily. She is compliant with a low sodium diet.  She has occasional palpitations that are transient-this is not new.  She denies chest pain, edema, shortness of breath and regular headaches. She is exercising regularly.      GERD:  She is taking her medication daily as prescribed.  She denies any GERD symptoms and feels her GERD is well controlled.   Hyperlipidemia: She is taking her medication daily. She is compliant with a low fat/cholesterol diet. She is exercising regularly. She denies myalgias.   Tobacco abuse:  She is still smoking a little.  She knows she needs to quit.  HRT: She is taking estradiol daily.  She is still taking 1 full pill a day and has not decreased it down to half a pill as we had discussed.  Anxiety: She is taking her medication daily as prescribed. She takes xanax only if needed during the day.  She denies any side effects from the medication. She still has some anxiety at times.     Depression: She is taking her medication daily as prescribed. She denies any side effects from the medication. She feels depression every now and then.   Prediabetes:  She is somewhat compliant with a low sugar/carbohydrate diet.  She is exercising regularly.  Weight loss: Since she was here last and was started on Remeron by Mickel Baas she has noticed increased appetite and has been eating more.  She has gained weight and overall feels better.  She still sometimes has to force herself to eat.   Medications and allergies reviewed with patient and updated if appropriate.  Patient Active Problem List   Diagnosis Date Noted  . Weight loss 07/17/2018  . Depression 07/17/2018  . Cold feeling 07/15/2018  . Primary osteoarthritis of right knee 06/04/2018  . Acute sinus  infection 04/05/2018  . Bilateral carotid artery stenosis 09/24/2017  . Aortic valve sclerosis 09/24/2017  . Tobacco abuse 09/24/2017  . Vocal cord polyps 09/07/2017  . Dysphagia 09/07/2017  . Cervical strain 07/24/2017  . Prediabetes 03/09/2017  . Chest pain 12/29/2016  . GERD (gastroesophageal reflux disease) 02/27/2016  . Hormone replacement therapy (HRT) 02/27/2016  . Anxiety 02/27/2016  . Osteoarthritis of left knee 02/19/2014  . S/P total knee replacement 02/13/2014  . RBBB 02/08/2014  . HTN (hypertension) 02/08/2014  . PVD - bilateral 60-79% carotid strenosis 02/08/2014  . Mixed hyperlipidemia 11/29/2013  . Carpal tunnel syndrome, bilateral 11/23/2013  . DJD (degenerative joint disease) 11/23/2013  . Murmur- mild -mod AR, mild MR 11/10/2013  . Chronic constipation 12/16/2012  . Arthritis 10/08/2012  . Nicotine abuse 12/29/2011    Current Outpatient Medications on File Prior to Visit  Medication Sig Dispense Refill  . ALPRAZolam (XANAX) 0.5 MG tablet Take 1 tablet (0.5 mg total) by mouth 2 (two) times daily as needed. for sleep 60 tablet 5  . Ascorbic Acid (VITAMIN C PO) Take 1 tablet by mouth daily.    Marland Kitchen aspirin 81 MG tablet Take 1 tablet (81 mg total) by mouth daily. 30 tablet   . bisoprolol-hydrochlorothiazide (ZIAC) 5-6.25 MG tablet TAKE 1/2 TABLET EVERY DAY (Patient taking differently: Take 0.5 tablets by mouth daily. ) 45 tablet 1  . Calcium Carbonate (CALCIUM 600 PO)  Take 1,200 mg by mouth daily.    . cholecalciferol (VITAMIN D) 1000 UNITS tablet Take 1 tablet (1,000 Units total) by mouth daily. 30 tablet 5  . docusate sodium (COLACE) 100 MG capsule Take 1 capsule (100 mg total) by mouth 2 (two) times daily. 30 capsule 0  . estradiol (ESTRACE) 0.5 MG tablet Take 0.5 tablets (0.25 mg total) by mouth daily. (Patient taking differently: Take 0.25-0.5 mg by mouth daily. ) 90 tablet 1  . fenofibrate 160 MG tablet Take 1 tablet (160 mg total) by mouth daily. 90 tablet 3  .  fexofenadine (ALLEGRA) 180 MG tablet Take 180 mg by mouth daily as needed for allergies.     . mirtazapine (REMERON) 7.5 MG tablet Take 1 tablet (7.5 mg total) by mouth at bedtime. 30 tablet 1  . omeprazole (PRILOSEC) 40 MG capsule TAKE 1 CAPSULE 2 TIMES DAILY BEFORE LUNCH AND SUPPER. 180 capsule 1  . oxyCODONE-acetaminophen (PERCOCET/ROXICET) 5-325 MG tablet     . pravastatin (PRAVACHOL) 20 MG tablet TAKE 1 TABLET EVERY EVENING 90 tablet 1  . Probiotic Product (PROBIOTIC DAILY PO) Take 1 capsule by mouth daily.    . sertraline (ZOLOFT) 25 MG tablet Take 1 tablet (25 mg total) by mouth at bedtime. 30 tablet 5  . tiZANidine (ZANAFLEX) 2 MG tablet Take 1 tablet (2 mg total) by mouth every 8 (eight) hours as needed for muscle spasms. 50 tablet 0   No current facility-administered medications on file prior to visit.     Past Medical History:  Diagnosis Date  . Allergy   . Anxiety   . Arthritis   . Carpal tunnel syndrome   . Depression   . Diverticulosis   . Dyslipidemia   . External hemorrhoids   . GERD (gastroesophageal reflux disease)   . Heart murmur    mild-moderate AR  . Hyperlipidemia    on meds   . Hypertension   . Internal hemorrhoids   . Osteoarthritis   . Pre-diabetes   . PVD (peripheral vascular disease) (HCC)    moderate carotid disease  . RBBB   . Smoker   . Tubular adenoma of colon   . Vocal cord polyps     Past Surgical History:  Procedure Laterality Date  . ABDOMINAL HYSTERECTOMY  1994  . COLONOSCOPY    . COLONOSCOPY W/ POLYPECTOMY  2009  . HEMORRHOID SURGERY    . right total knee arthroplasty     Dr. Emeterio Reeve 06-04-18  . SPINE SURGERY  08/16/2009  . TOTAL KNEE ARTHROPLASTY Left 02/13/2014   Procedure: TOTAL KNEE ARTHROPLASTY;  Surgeon: Alta Corning, MD;  Location: [facility];  Service: Orthopedics;  Laterality: Left;  . TOTAL KNEE ARTHROPLASTY Right 06/04/2018   Procedure: RIGHT TOTAL KNEE ARTHROPLASTY;  Surgeon: Dorna Leitz, MD;  Location: [facility];  Service:  Orthopedics;  Laterality: Right;  Adductor Block  . TUBAL LIGATION      Social History   Socioeconomic History  . Marital status: Married    Spouse name: Not on file  . Number of children: 2  . Years of education: Not on file  . Highest education level: Not on file  Occupational History  . Not on file  Social Needs  . Financial resource strain: Not hard at all  . Food insecurity:    Worry: Never true    Inability: Never true  . Transportation needs:    Medical: No    Non-medical: No  Tobacco Use  . Smoking status:  Light Tobacco Smoker    Packs/day: 0.50    Years: 40.00    Pack years: 20.00    Types: Cigarettes  . Smokeless tobacco: Never Used  . Tobacco comment: PACK WILL LAST 3 days  Substance and Sexual Activity  . Alcohol use: No  . Drug use: No  . Sexual activity: Not Currently  Lifestyle  . Physical activity:    Days per week: 2 days    Minutes per session: 60 min  . Stress: To some extent  Relationships  . Social connections:    Talks on phone: More than three times a week    Gets together: More than three times a week    Attends religious service: More than 4 times per year    Active member of club or organization: Yes    Attends meetings of clubs or organizations: More than 4 times per year    Relationship status: Married  Other Topics Concern  . Not on file  Social History Narrative  . Not on file    Family History  Problem Relation Age of Onset  . Cancer Father   . Prostate cancer Father   . Diabetes Sister   . Stroke Maternal Grandfather   . Colon cancer Neg Hx   . Colon polyps Neg Hx   . Rectal cancer Neg Hx   . Stomach cancer Neg Hx   . Esophageal cancer Neg Hx     Review of Systems  Constitutional: Negative for chills and fever.  Respiratory: Negative for cough and shortness of breath.   Cardiovascular: Positive for palpitations (occ, transient). Negative for chest pain and leg swelling.  Neurological: Negative for light-headedness  and headaches.  Psychiatric/Behavioral: Negative for sleep disturbance (sleeping good).       Objective:   Vitals:   09/10/18 1357  BP: 122/68  Pulse: 70  Resp: 16  Temp: 98.4 F (36.9 C)  SpO2: 99%   BP Readings from Last 3 Encounters:  09/10/18 122/68  08/13/18 132/78  07/16/18 (!) 142/60   Wt Readings from Last 3 Encounters:  09/10/18 112 lb (50.8 kg)  08/13/18 101 lb (45.8 kg)  07/16/18 106 lb (48.1 kg)   Body mass index is 21.87 kg/m.   Physical Exam    Constitutional: Appears well-developed and well-nourished. No distress.  HENT:  Head: Normocephalic and atraumatic.  Neck: Neck supple. No tracheal deviation present. No thyromegaly present.  No cervical lymphadenopathy Cardiovascular: Normal rate, regular rhythm and normal heart sounds.   No murmur heard. No carotid bruit .  No edema Pulmonary/Chest: Effort normal and breath sounds normal. No respiratory distress. No has no wheezes. No rales.  Skin: Skin is warm and dry. Not diaphoretic.  Psychiatric: Normal mood and affect. Behavior is normal.      Assessment & Plan:    See Problem List for Assessment and Plan of chronic medical problems.

## 2018-09-10 ENCOUNTER — Other Ambulatory Visit: Payer: Self-pay

## 2018-09-10 ENCOUNTER — Ambulatory Visit (INDEPENDENT_AMBULATORY_CARE_PROVIDER_SITE_OTHER): Payer: 59 | Admitting: Internal Medicine

## 2018-09-10 ENCOUNTER — Encounter: Payer: Self-pay | Admitting: Internal Medicine

## 2018-09-10 ENCOUNTER — Other Ambulatory Visit (INDEPENDENT_AMBULATORY_CARE_PROVIDER_SITE_OTHER): Payer: 59

## 2018-09-10 VITALS — BP 122/68 | HR 70 | Temp 98.4°F | Resp 16 | Ht 60.0 in | Wt 112.0 lb

## 2018-09-10 DIAGNOSIS — R634 Abnormal weight loss: Secondary | ICD-10-CM

## 2018-09-10 DIAGNOSIS — F419 Anxiety disorder, unspecified: Secondary | ICD-10-CM

## 2018-09-10 DIAGNOSIS — R7303 Prediabetes: Secondary | ICD-10-CM

## 2018-09-10 DIAGNOSIS — I10 Essential (primary) hypertension: Secondary | ICD-10-CM | POA: Diagnosis not present

## 2018-09-10 DIAGNOSIS — K219 Gastro-esophageal reflux disease without esophagitis: Secondary | ICD-10-CM

## 2018-09-10 DIAGNOSIS — Z79890 Hormone replacement therapy (postmenopausal): Secondary | ICD-10-CM

## 2018-09-10 DIAGNOSIS — F3289 Other specified depressive episodes: Secondary | ICD-10-CM | POA: Diagnosis not present

## 2018-09-10 LAB — COMPREHENSIVE METABOLIC PANEL
ALT: 10 U/L (ref 0–35)
AST: 16 U/L (ref 0–37)
Albumin: 4.1 g/dL (ref 3.5–5.2)
Alkaline Phosphatase: 59 U/L (ref 39–117)
BUN: 13 mg/dL (ref 6–23)
CO2: 32 mEq/L (ref 19–32)
Calcium: 10 mg/dL (ref 8.4–10.5)
Chloride: 104 mEq/L (ref 96–112)
Creatinine, Ser: 0.8 mg/dL (ref 0.40–1.20)
GFR: 85.78 mL/min (ref >60.00)
Glucose, Bld: 107 mg/dL — ABNORMAL HIGH (ref 70–99)
Potassium: 3.6 mEq/L (ref 3.5–5.1)
Sodium: 143 mEq/L (ref 135–145)
Total Bilirubin: 0.2 mg/dL (ref 0.2–1.2)
Total Protein: 6.7 g/dL (ref 6.0–8.3)

## 2018-09-10 LAB — CBC WITH DIFFERENTIAL/PLATELET
Basophils Absolute: 0.1 10*3/uL (ref 0.0–0.1)
Basophils Relative: 1.1 % (ref 0.0–3.0)
Eosinophils Absolute: 0.2 10*3/uL (ref 0.0–0.7)
Eosinophils Relative: 2.2 % (ref 0.0–5.0)
HCT: 34.6 % — ABNORMAL LOW (ref 36.0–46.0)
Hemoglobin: 11.2 g/dL — ABNORMAL LOW (ref 12.0–15.0)
Lymphocytes Relative: 31.4 % (ref 12.0–46.0)
Lymphs Abs: 2.3 10*3/uL (ref 0.7–4.0)
MCHC: 32.3 g/dL (ref 30.0–36.0)
MCV: 82 fl (ref 78.0–100.0)
Monocytes Absolute: 0.6 10*3/uL (ref 0.1–1.0)
Monocytes Relative: 8.3 % (ref 3.0–12.0)
Neutro Abs: 4.2 10*3/uL (ref 1.4–7.7)
Neutrophils Relative %: 57 % (ref 43.0–77.0)
Platelets: 241 10*3/uL (ref 150.0–400.0)
RBC: 4.22 Mil/uL (ref 3.87–5.11)
RDW: 14.1 % (ref 11.5–15.5)
WBC: 7.4 10*3/uL (ref 4.0–10.5)

## 2018-09-10 LAB — HEMOGLOBIN A1C: Hgb A1c MFr Bld: 6.3 % (ref 4.6–6.5)

## 2018-09-10 MED ORDER — PRAVASTATIN SODIUM 20 MG PO TABS: 20.0000 mg | ORAL_TABLET | Freq: Every evening | ORAL | 1 refills | Status: DC

## 2018-09-10 MED ORDER — FENOFIBRATE 160 MG PO TABS: 160.0000 mg | ORAL_TABLET | Freq: Every day | ORAL | 1 refills | Status: DC

## 2018-09-10 MED ORDER — BISOPROLOL-HYDROCHLOROTHIAZIDE 5-6.25 MG PO TABS: 0.5000 | ORAL_TABLET | Freq: Every day | ORAL | 1 refills | Status: DC

## 2018-09-10 MED ORDER — ESTRADIOL 0.5 MG PO TABS: 0.2500 mg | ORAL_TABLET | Freq: Every day | ORAL | 1 refills | Status: DC

## 2018-09-10 MED ORDER — ASPIRIN 81 MG PO TABS: 81.0000 mg | ORAL_TABLET | Freq: Every day | ORAL | Status: DC

## 2018-09-10 MED ORDER — ALPRAZOLAM 0.5 MG PO TABS: 0.5000 mg | ORAL_TABLET | Freq: Two times a day (BID) | ORAL | 5 refills | Status: DC | PRN

## 2018-09-10 MED ORDER — OMEPRAZOLE 40 MG PO CPDR: DELAYED_RELEASE_CAPSULE | ORAL | 1 refills | Status: DC

## 2018-09-10 MED ORDER — SERTRALINE HCL 25 MG PO TABS: 25.0000 mg | ORAL_TABLET | Freq: Every day | ORAL | 1 refills | Status: DC

## 2018-09-10 NOTE — Patient Instructions (Signed)
  Tests ordered today. Your results will be released to [facility] (or called to you) after review, usually within 72hours after test completion. If any changes need to be made, you will be notified at that same time.   Medications reviewed and updated.  Changes include :   Decreasing estradiol to 1/2 pill daily  Your prescription(s) have been submitted to your pharmacy. Please take as directed and contact our office if you believe you are having problem(s) with the medication(s).   Please followup in 6 months

## 2018-09-10 NOTE — Assessment & Plan Note (Signed)
Taking estradiol 0.5 mg daily Will decrease to 0.25 mg daily and hopefully slowly taper her off Advised her to ideally decrease to every other day and taper off on her own Discussed risks especially as she is 68 years old and still smoking

## 2018-09-10 NOTE — Assessment & Plan Note (Signed)
GERD controlled Continue daily medication  

## 2018-09-10 NOTE — Assessment & Plan Note (Signed)
Check a1c Low sugar / carb diet Stressed regular exercise   

## 2018-09-10 NOTE — Assessment & Plan Note (Signed)
BP well controlled Current regimen effective and well tolerated Continue current medications at current doses cmp  

## 2018-09-10 NOTE — Assessment & Plan Note (Signed)
Anxiety is fairly controlled-does have some anxiety medication Discussed possibly increasing sertraline, but decided to keep it the same for now-can increase in the near future if needed Xanax as needed

## 2018-09-10 NOTE — Assessment & Plan Note (Signed)
Remeron added last month She has gained weight Overall feeling better Working on improving her nutrition We will continue to monitor weight

## 2018-09-10 NOTE — Assessment & Plan Note (Signed)
Still with some depression at times, but not regularly Depression is better Discussed if we should increase her medication-sertraline to 50 mg more monitor for now We will monitor, but at any point in time if she wants to increase the dose she will call me and we can increase over the phone Follow-up in 6 months, sooner if needed

## 2018-09-13 DIAGNOSIS — L821 Other seborrheic keratosis: Secondary | ICD-10-CM | POA: Diagnosis not present

## 2018-09-13 DIAGNOSIS — L819 Disorder of pigmentation, unspecified: Secondary | ICD-10-CM | POA: Diagnosis not present

## 2018-09-20 ENCOUNTER — Telehealth: Payer: Self-pay | Admitting: Cardiology

## 2018-09-20 NOTE — Telephone Encounter (Signed)
   Cardiac Questionnaire:    Since your last visit or hospitalization:    1. Have you been having new or worsening chest pain? No    2. Have you been having new or worsening shortness of breath? No  3. Have you been having new or worsening leg swelling, wt gain, or increase in abdominal girth (pants fitting more tightly)? No    4. Have you had any passing out spells? No     *A YES to any of these questions would result in the appointment being kept. *If all the answers to these questions are NO, we should indicate that given the current situation regarding the worldwide coronarvirus pandemic, at the recommendation of the CDC, we are looking to limit gatherings in our waiting area, and thus will reschedule their appointment beyond four weeks from today.   _____________     Primary Cardiologist:  Minus Breeding, MD   Patient contacted.  History reviewed.  No symptoms to suggest any unstable cardiac conditions.  Based on discussion, with current pandemic situation, we will be postponing this appointment for [facility].  If symptoms change, she has been instructed to contact our office.   Routing to C19 CANCEL pool for tracking (P CV DIV CV19 CANCEL) and assigning priority ( 3 = >12 wks).  Kathyrn Drown, NP  09/20/2018 12:42 PM         .

## 2018-09-24 ENCOUNTER — Ambulatory Visit: Payer: 59 | Admitting: Cardiology

## 2018-10-04 ENCOUNTER — Other Ambulatory Visit: Payer: Self-pay | Admitting: Family

## 2018-10-19 ENCOUNTER — Telehealth: Payer: Self-pay | Admitting: Cardiology

## 2018-10-19 NOTE — Progress Notes (Signed)
Virtual Visit via Telephone Note   This visit type was conducted due to national recommendations for restrictions regarding the COVID-19 Pandemic (e.g. social distancing) in an effort to limit this patient's exposure and mitigate transmission in our community.  Due to her co-morbid illnesses, this patient is at least at moderate risk for complications without adequate follow up.  This format is felt to be most appropriate for this patient at this time.  The patient did not have access to video technology/had technical difficulties with video requiring transitioning to audio format only (telephone).  All issues noted in this document were discussed and addressed.  No physical exam could be performed with this format.  Please refer to the patient's chart for her  consent to telehealth for [facility].   Evaluation Performed:  Follow-up visit  Date:  10/19/2018   ID:  Hannah [name], DOB [birth year], MRN [id number]  Patient Location: Home Provider Location: Home  PCP:  [name], MD  Cardiologist:  Minus Breeding, MD  Electrophysiologist:  None   Chief Complaint:    History of Present Illness:    Hannah [name] is a 68 y.o. female who presents for follow up of  PVD and a murmur.  She had mild sclerosis on her aortic valve with mild aortic insufficiency. She did have carotid artery disease as described below.  Since I last saw her she had TKR in December.    She was able to do some physical therapy but now she is limited because of the pandemic.  She says she is not having any cardiovascular complaints. The patient denies any new symptoms such as chest discomfort, neck or arm discomfort. There has been no new shortness of breath, PND or orthopnea. There have been no reported palpitations, presyncope or syncope.  She is still smoking.   The patient does not have symptoms concerning for COVID-19 infection (fever, chills, cough, or new shortness of breath).    Past Medical History:   Diagnosis Date  . Allergy   . Anxiety   . Arthritis   . Carpal tunnel syndrome   . Depression   . Diverticulosis   . Dyslipidemia   . External hemorrhoids   . GERD (gastroesophageal reflux disease)   . Heart murmur    mild-moderate AR  . Hyperlipidemia    on meds   . Hypertension   . Internal hemorrhoids   . Osteoarthritis   . Pre-diabetes   . PVD (peripheral vascular disease) (HCC)    moderate carotid disease  . RBBB   . Smoker   . Tubular adenoma of colon   . Vocal cord polyps    Past Surgical History:  Procedure Laterality Date  . ABDOMINAL HYSTERECTOMY  1994  . COLONOSCOPY    . COLONOSCOPY W/ POLYPECTOMY  2009  . HEMORRHOID SURGERY    . right total knee arthroplasty     Dr. Emeterio Reeve 06-04-18  . SPINE SURGERY  08/16/2009  . TOTAL KNEE ARTHROPLASTY Left 02/13/2014   Procedure: TOTAL KNEE ARTHROPLASTY;  Surgeon: Alta Corning, MD;  Location: [facility];  Service: Orthopedics;  Laterality: Left;  . TOTAL KNEE ARTHROPLASTY Right 06/04/2018   Procedure: RIGHT TOTAL KNEE ARTHROPLASTY;  Surgeon: Dorna Leitz, MD;  Location: [facility];  Service: Orthopedics;  Laterality: Right;  Adductor Block  . TUBAL LIGATION       Prior to Admission medications   Medication Sig Start Date End Date Taking? Authorizing Provider  ALPRAZolam Duanne Moron) 0.5 MG tablet Take  1 tablet (0.5 mg total) by mouth 2 (two) times daily as needed. for sleep 09/10/18   [name], MD  Ascorbic Acid (VITAMIN C PO) Take 1 tablet by mouth daily.    [provider]  aspirin 81 MG tablet Take 1 tablet (81 mg total) by mouth daily. 09/10/18   [name], MD  bisoprolol-hydrochlorothiazide (ZIAC) 5-6.25 MG tablet Take 0.5 tablets by mouth daily. 09/10/18   [name], MD  Calcium Carbonate (CALCIUM 600 PO) Take 1,200 mg by mouth every other day.     [provider]  cholecalciferol (VITAMIN D) 1000 UNITS tablet Take 1 tablet (1,000 Units total) by mouth daily. 05/30/14   Ivar Drape D, PA   estradiol (ESTRACE) 0.5 MG tablet Take 0.5 tablets (0.25 mg total) by mouth daily. 09/10/18   [name], MD  fenofibrate 160 MG tablet Take 1 tablet (160 mg total) by mouth daily. 09/10/18   [name], MD  mirtazapine (REMERON) 7.5 MG tablet TAKE 1 TABLET BY MOUTH AT BEDTIME 10/04/18   Burns, Claudina Lick, MD  omeprazole (PRILOSEC) 40 MG capsule TAKE 1 CAPSULE 2 TIMES DAILY BEFORE LUNCH AND SUPPER. 09/10/18   Burns, Claudina Lick, MD  pravastatin (PRAVACHOL) 20 MG tablet Take 1 tablet (20 mg total) by mouth every evening. 09/10/18   [name], MD  Probiotic Product (PROBIOTIC DAILY PO) Take 1 capsule by mouth daily.    [provider]  sertraline (ZOLOFT) 25 MG tablet Take 1 tablet (25 mg total) by mouth at bedtime. 09/10/18   [name], MD     Allergies:   Amlodipine; Chantix [varenicline tartrate]; Clarithromycin; Lisinopril; Simvastatin; Wellbutrin [bupropion hcl]; and Lipitor [atorvastatin]   Social History   Tobacco Use  . Smoking status: Light Tobacco Smoker    Packs/day: 0.50    Years: 40.00    Pack years: 20.00    Types: Cigarettes  . Smokeless tobacco: Never Used  . Tobacco comment: PACK WILL LAST 3 days  Substance Use Topics  . Alcohol use: No  . Drug use: No     Family Hx: The patient's family history includes Cancer in her father; Diabetes in her sister; Prostate cancer in her father; Stroke in her maternal grandfather. There is no history of Colon cancer, Colon polyps, Rectal cancer, Stomach cancer, or Esophageal cancer.  ROS:   Please see the history of present illness.    As stated in the HPI and negative for all other systems.   Prior CV studies:   The following studies were reviewed today:  Echo 2015 and carotid study from last year.  Labs/Other Tests and Data Reviewed:    EKG:  No ECG reviewed.  Recent Labs: 07/16/2018: TSH 0.76 09/10/2018: ALT 10; BUN 13; Creatinine, Ser 0.80; Hemoglobin 11.2; Platelets 241.0; Potassium 3.6; Sodium 143    Recent Lipid Panel Lab Results  Component Value Date/Time   CHOL 138 03/10/2018 09:35 AM   TRIG 129.0 03/10/2018 09:35 AM   HDL 52.00 03/10/2018 09:35 AM   CHOLHDL 3 03/10/2018 09:35 AM   LDLCALC 60 03/10/2018 09:35 AM   LDLDIRECT 87.0 11/09/2014 08:19 AM    Wt Readings from Last 3 Encounters:  09/10/18 112 lb (50.8 kg)  08/13/18 101 lb (45.8 kg)  07/16/18 106 lb (48.1 kg)     Objective:    Vital Signs:  There were no vitals taken for this visit.     ASSESSMENT & PLAN:    AS/AI:  This is mild aortic sclerosis and AI on echo in 2015.  I will see her in the office and after exam will decide on whether she needs a repeat echo.  She has no new symptoms to suggest progression.   RBBB/LAFB:    We talked about ready arrhythmias that could cause symptoms and she would let me know if she has any presyncope or lightheadedness or syncope and she is not had any of this.  CAROTID STENOSIS:   She has 40 - 59% bilateral carotid stenosis and she will need follow up in Sept.    DYSLIPIDEMIA:     LDL was 60 and HDL 52 last year she will come back and have this repeated in September.   TOBACCO:   She says she still smoking a few cigarettes and we talked about this again today and I encouraged complete cessation.  BRADYCARDIA:    She has no symptoms.  I will follow as above.  HYPERTENSION:  Her BP was elevated today but this was unusual and I looked back.  She reports it is unusual and it is typically 120/60.  She keep a blood pressure diary and let me know if it is running high.   COVID-19 Education: The signs and symptoms of COVID-19 were discussed with the patient and how to seek care for testing (follow up with PCP or arrange E-visit).  The importance of social distancing was discussed today.  Time:   Today, I have spent 20 minutes with the patient with telehealth technology discussing the above problems.     Medication Adjustments/Labs and Tests Ordered: Current medicines are  reviewed at length with the patient today.  Concerns regarding medicines are outlined above.   Tests Ordered: No orders of the defined types were placed in this encounter.   Medication Changes: No orders of the defined types were placed in this encounter.   Disposition:  Follow up in Sept  Signed, Minus Breeding, MD  10/19/2018 6:17 PM    [place]

## 2018-10-19 NOTE — Telephone Encounter (Signed)
Call [phone] my chart via email/ vitual consent/ pre reg completed

## 2018-10-20 ENCOUNTER — Encounter: Payer: Self-pay | Admitting: Cardiology

## 2018-10-20 ENCOUNTER — Telehealth (INDEPENDENT_AMBULATORY_CARE_PROVIDER_SITE_OTHER): Payer: 59 | Admitting: Cardiology

## 2018-10-20 VITALS — BP 169/70 | Ht 60.0 in | Wt 112.0 lb

## 2018-10-20 DIAGNOSIS — Z7189 Other specified counseling: Secondary | ICD-10-CM | POA: Insufficient documentation

## 2018-10-20 DIAGNOSIS — E785 Hyperlipidemia, unspecified: Secondary | ICD-10-CM

## 2018-10-20 DIAGNOSIS — I6523 Occlusion and stenosis of bilateral carotid arteries: Secondary | ICD-10-CM

## 2018-10-20 DIAGNOSIS — I358 Other nonrheumatic aortic valve disorders: Secondary | ICD-10-CM

## 2018-10-20 DIAGNOSIS — R9431 Abnormal electrocardiogram [ECG] [EKG]: Secondary | ICD-10-CM | POA: Insufficient documentation

## 2018-10-20 NOTE — Patient Instructions (Signed)
Medication Instructions:  Continue current medications  If you need a refill on your cardiac medications before your next appointment, please call your pharmacy.  Labwork: None Ordered   Testing/Procedures: Your physician has requested that you have a carotid duplex in September. This test is an ultrasound of the carotid arteries in your neck. It looks at blood flow through these arteries that supply the brain with blood. Allow one hour for this exam. There are no restrictions or special instructions.  Follow-Up: You will need a follow up appointment in 6 months.  Please call our office 2 months in advance to schedule this appointment.  You may see Minus Breeding, MD or one of the following Advanced Practice Providers on your designated Care Team:   Rosaria Ferries, PA-C . Jory Sims, DNP, ANP    At [facility], you and your health needs are our priority.  As part of our continuing mission to provide you with exceptional heart care, we have created designated Provider Care Teams.  These Care Teams include your primary Cardiologist (physician) and Advanced Practice Providers (APPs -  Physician Assistants and Nurse Practitioners) who all work together to provide you with the care you need, when you need it.  Thank you for choosing [facility] at [facility]!!

## 2018-11-08 ENCOUNTER — Other Ambulatory Visit: Payer: Self-pay | Admitting: Internal Medicine

## 2018-11-18 NOTE — Progress Notes (Signed)
Subjective:    Patient ID: Hannah [name], female    DOB: 09-Nov-1950, 68 y.o.   MRN: [id number]  HPI She is here for an acute visit for cold symptoms.  Her symptoms started about two weeks ago.   She is experiencing pressure in both ears, but the left is worse.  The left ear was sore but that has gotten better.  She has some nasal congestion, sinus pressure, PND and headaches.  She denies fever, cough.    She has taken anti-histamine and mucinex w/o improvement.  Medications and allergies reviewed with patient and updated if appropriate.  Patient Active Problem List   Diagnosis Date Noted  . Nonspecific abnormal electrocardiogram (ECG) (EKG) 10/20/2018  . Educated About Covid-19 Virus Infection 10/20/2018  . Weight loss 07/17/2018  . Depression 07/17/2018  . Cold feeling 07/15/2018  . Primary osteoarthritis of right knee 06/04/2018  . Acute sinus infection 04/05/2018  . Bilateral carotid artery stenosis 09/24/2017  . Aortic valve sclerosis 09/24/2017  . Tobacco abuse 09/24/2017  . Vocal cord polyps 09/07/2017  . Dysphagia 09/07/2017  . Cervical strain 07/24/2017  . Prediabetes 03/09/2017  . Chest pain 12/29/2016  . GERD (gastroesophageal reflux disease) 02/27/2016  . Hormone replacement therapy (HRT) 02/27/2016  . Anxiety 02/27/2016  . Osteoarthritis of left knee 02/19/2014  . S/P total knee replacement 02/13/2014  . RBBB 02/08/2014  . HTN (hypertension) 02/08/2014  . PVD - bilateral 60-79% carotid strenosis 02/08/2014  . Mixed hyperlipidemia 11/29/2013  . Carpal tunnel syndrome, bilateral 11/23/2013  . DJD (degenerative joint disease) 11/23/2013  . Murmur- mild -mod AR, mild MR 11/10/2013  . Chronic constipation 12/16/2012  . Arthritis 10/08/2012  . Nicotine abuse 12/29/2011    Current Outpatient Medications on File Prior to Visit  Medication Sig Dispense Refill  . ALPRAZolam (XANAX) 0.5 MG tablet Take 1 tablet (0.5 mg total) by mouth 2 (two) times daily as  needed. for sleep 60 tablet 5  . Ascorbic Acid (VITAMIN C PO) Take 1 tablet by mouth daily.    Marland Kitchen aspirin 81 MG tablet Take 1 tablet (81 mg total) by mouth daily. 30 tablet   . bisoprolol-hydrochlorothiazide (ZIAC) 5-6.25 MG tablet Take 0.5 tablets by mouth daily. 45 tablet 1  . Calcium Carbonate (CALCIUM 600 PO) Take 1,200 mg by mouth every other day.     . cholecalciferol (VITAMIN D) 1000 UNITS tablet Take 1 tablet (1,000 Units total) by mouth daily. 30 tablet 5  . estradiol (ESTRACE) 0.5 MG tablet Take 0.5 tablets (0.25 mg total) by mouth daily. 45 tablet 1  . fenofibrate 160 MG tablet Take 1 tablet (160 mg total) by mouth daily. 90 tablet 1  . mirtazapine (REMERON) 7.5 MG tablet TAKE 1 TABLET BY MOUTH AT BEDTIME 30 tablet 0  . omeprazole (PRILOSEC) 40 MG capsule TAKE 1 CAPSULE 2 TIMES DAILY BEFORE LUNCH AND SUPPER. 180 capsule 1  . pravastatin (PRAVACHOL) 20 MG tablet Take 1 tablet (20 mg total) by mouth every evening. 90 tablet 1  . Probiotic Product (PROBIOTIC DAILY PO) Take 1 capsule by mouth daily.    . sertraline (ZOLOFT) 25 MG tablet Take 1 tablet (25 mg total) by mouth at bedtime. 90 tablet 1   No current facility-administered medications on file prior to visit.     Past Medical History:  Diagnosis Date  . Allergy   . Anxiety   . Arthritis   . Carpal tunnel syndrome   . Depression   . Diverticulosis   .  Dyslipidemia   . External hemorrhoids   . GERD (gastroesophageal reflux disease)   . Heart murmur    mild-moderate AR  . Hyperlipidemia    on meds   . Hypertension   . Internal hemorrhoids   . Osteoarthritis   . Pre-diabetes   . PVD (peripheral vascular disease) (HCC)    moderate carotid disease  . RBBB   . Smoker   . Tubular adenoma of colon   . Vocal cord polyps     Past Surgical History:  Procedure Laterality Date  . ABDOMINAL HYSTERECTOMY  1994  . COLONOSCOPY    . COLONOSCOPY W/ POLYPECTOMY  2009  . HEMORRHOID SURGERY    . right total knee arthroplasty      Dr. Emeterio Reeve 06-04-18  . SPINE SURGERY  08/16/2009  . TOTAL KNEE ARTHROPLASTY Left 02/13/2014   Procedure: TOTAL KNEE ARTHROPLASTY;  Surgeon: Alta Corning, MD;  Location: [facility];  Service: Orthopedics;  Laterality: Left;  . TOTAL KNEE ARTHROPLASTY Right 06/04/2018   Procedure: RIGHT TOTAL KNEE ARTHROPLASTY;  Surgeon: Dorna Leitz, MD;  Location: [facility];  Service: Orthopedics;  Laterality: Right;  Adductor Block  . TUBAL LIGATION      Social History   Socioeconomic History  . Marital status: Married    Spouse name: Not on file  . Number of children: 2  . Years of education: Not on file  . Highest education level: Not on file  Occupational History  . Not on file  Social Needs  . Financial resource strain: Not hard at all  . Food insecurity:    Worry: Never true    Inability: Never true  . Transportation needs:    Medical: No    Non-medical: No  Tobacco Use  . Smoking status: Light Tobacco Smoker    Packs/day: 0.50    Years: 40.00    Pack years: 20.00    Types: Cigarettes  . Smokeless tobacco: Never Used  . Tobacco comment: PACK WILL LAST 3 days  Substance and Sexual Activity  . Alcohol use: No  . Drug use: No  . Sexual activity: Not Currently  Lifestyle  . Physical activity:    Days per week: 2 days    Minutes per session: 60 min  . Stress: To some extent  Relationships  . Social connections:    Talks on phone: More than three times a week    Gets together: More than three times a week    Attends religious service: More than 4 times per year    Active member of club or organization: Yes    Attends meetings of clubs or organizations: More than 4 times per year    Relationship status: Married  Other Topics Concern  . Not on file  Social History Narrative  . Not on file    Family History  Problem Relation Age of Onset  . Cancer Father   . Prostate cancer Father   . Diabetes Sister   . Stroke Maternal Grandfather   . Colon cancer Neg Hx   . Colon polyps Neg Hx    . Rectal cancer Neg Hx   . Stomach cancer Neg Hx   . Esophageal cancer Neg Hx     Review of Systems  Constitutional: Negative for chills and fever.  HENT: Positive for congestion (mild), postnasal drip and sinus pressure. Negative for ear pain (ear pressure) and sore throat.   Respiratory: Negative for cough, shortness of breath and wheezing.   Neurological: Positive  for headaches (slight). Negative for light-headedness.       Objective:   Vitals:   11/19/18 0738  BP: 124/72  Pulse: 78  Resp: 16  Temp: 98.6 F (37 C)  SpO2: 98%   Filed Weights   11/19/18 0738  Weight: 125 lb (56.7 kg)   Body mass index is 24.41 kg/m.  Wt Readings from Last 3 Encounters:  11/19/18 125 lb (56.7 kg)  10/20/18 112 lb (50.8 kg)  09/10/18 112 lb (50.8 kg)     Physical Exam GENERAL APPEARANCE: Appears stated age, well appearing, NAD EYES: conjunctiva clear, no icterus HEENT: bilateral tympanic membranes and ear canals normal, oropharynx with no erythema, no thyromegaly, trachea midline, no cervical or supraclavicular lymphadenopathy LUNGS: Clear to auscultation without wheeze or crackles, unlabored breathing, good air entry bilaterally CARDIOVASCULAR: Normal S1,S2 without murmurs, no edema SKIN: warm, dry        Assessment & Plan:   See Problem List for Assessment and Plan of chronic medical problems.

## 2018-11-19 ENCOUNTER — Encounter: Payer: Self-pay | Admitting: Internal Medicine

## 2018-11-19 ENCOUNTER — Ambulatory Visit (INDEPENDENT_AMBULATORY_CARE_PROVIDER_SITE_OTHER): Payer: 59 | Admitting: Internal Medicine

## 2018-11-19 ENCOUNTER — Other Ambulatory Visit: Payer: Self-pay

## 2018-11-19 VITALS — BP 124/72 | HR 78 | Temp 98.6°F | Resp 16 | Ht 60.0 in | Wt 125.0 lb

## 2018-11-19 DIAGNOSIS — J019 Acute sinusitis, unspecified: Secondary | ICD-10-CM

## 2018-11-19 MED ORDER — AMOXICILLIN-POT CLAVULANATE 875-125 MG PO TABS: 1.0000 | ORAL_TABLET | Freq: Two times a day (BID) | ORAL | 0 refills | Status: DC

## 2018-11-19 NOTE — Patient Instructions (Addendum)
Take the antibiotic as prescribed - complete the entire course.  Keep taking the anti-histamine.    Continue over the counter cold medication, advil and tylenol.  Increase your fluids and rest.    Call if no improvement       Sinusitis, Adult Sinusitis is inflammation of your sinuses. Sinuses are hollow spaces in the bones around your face. Your sinuses are located:  Around your eyes.  In the middle of your forehead.  Behind your nose.  In your cheekbones. Mucus normally drains out of your sinuses. When your nasal tissues become inflamed or swollen, mucus can become trapped or blocked. This allows bacteria, viruses, and fungi to grow, which leads to infection. Most infections of the sinuses are caused by a virus. Sinusitis can develop quickly. It can last for up to 4 weeks (acute) or for more than 12 weeks (chronic). Sinusitis often develops after a cold. What are the causes? This condition is caused by anything that creates swelling in the sinuses or stops mucus from draining. This includes:  Allergies.  Asthma.  Infection from bacteria or viruses.  Deformities or blockages in your nose or sinuses.  Abnormal growths in the nose (nasal polyps).  Pollutants, such as chemicals or irritants in the air.  Infection from fungi (rare). What increases the risk? You are more likely to develop this condition if you:  Have a weak body defense system (immune system).  Do a lot of swimming or diving.  Overuse nasal sprays.  Smoke. What are the signs or symptoms? The main symptoms of this condition are pain and a feeling of pressure around the affected sinuses. Other symptoms include:  Stuffy nose or congestion.  Thick drainage from your nose.  Swelling and warmth over the affected sinuses.  Headache.  Upper toothache.  A cough that may get worse at night.  Extra mucus that collects in the throat or the back of the nose (postnasal drip).  Decreased sense of smell  and taste.  Fatigue.  A fever.  Sore throat.  Bad breath. How is this diagnosed? This condition is diagnosed based on:  Your symptoms.  Your medical history.  A physical exam.  Tests to find out if your condition is acute or chronic. This may include: ? Checking your nose for nasal polyps. ? Viewing your sinuses using a device that has a light (endoscope). ? Testing for allergies or bacteria. ? Imaging tests, such as an MRI or CT scan. In rare cases, a bone biopsy may be done to rule out more serious types of fungal sinus disease. How is this treated? Treatment for sinusitis depends on the cause and whether your condition is chronic or acute.  If caused by a virus, your symptoms should go away on their own within 10 days. You may be given medicines to relieve symptoms. They include: ? Medicines that shrink swollen nasal passages (topical intranasal decongestants). ? Medicines that treat allergies (antihistamines). ? A spray that eases inflammation of the nostrils (topical intranasal corticosteroids). ? Rinses that help get rid of thick mucus in your nose (nasal saline washes).  If caused by bacteria, your health care provider may recommend waiting to see if your symptoms improve. Most bacterial infections will get better without antibiotic medicine. You may be given antibiotics if you have: ? A severe infection. ? A weak immune system.  If caused by narrow nasal passages or nasal polyps, you may need to have surgery. Follow these instructions at home: Medicines  Take, use,  or apply over-the-counter and prescription medicines only as told by your health care provider. These may include nasal sprays.  If you were prescribed an antibiotic medicine, take it as told by your health care provider. Do not stop taking the antibiotic even if you start to feel better. Hydrate and humidify   Drink enough fluid to keep your urine pale yellow. Staying hydrated will help to thin your  mucus.  Use a cool mist humidifier to keep the humidity level in your home above 50%.  Inhale steam for 10-15 minutes, 3-4 times a day, or as told by your health care provider. You can do this in the bathroom while a hot shower is running.  Limit your exposure to cool or dry air. Rest  Rest as much as possible.  Sleep with your head raised (elevated).  Make sure you get enough sleep each night. General instructions   Apply a warm, moist washcloth to your face 3-4 times a day or as told by your health care provider. This will help with discomfort.  Wash your hands often with soap and water to reduce your exposure to germs. If soap and water are not available, use hand sanitizer.  Do not smoke. Avoid being around people who are smoking (secondhand smoke).  Keep all follow-up visits as told by your health care provider. This is important. Contact a health care provider if:  You have a fever.  Your symptoms get worse.  Your symptoms do not improve within 10 days. Get help right away if:  You have a severe headache.  You have persistent vomiting.  You have severe pain or swelling around your face or eyes.  You have vision problems.  You develop confusion.  Your neck is stiff.  You have trouble breathing. Summary  Sinusitis is soreness and inflammation of your sinuses. Sinuses are hollow spaces in the bones around your face.  This condition is caused by nasal tissues that become inflamed or swollen. The swelling traps or blocks the flow of mucus. This allows bacteria, viruses, and fungi to grow, which leads to infection.  If you were prescribed an antibiotic medicine, take it as told by your health care provider. Do not stop taking the antibiotic even if you start to feel better.  Keep all follow-up visits as told by your health care provider. This is important. This information is not intended to replace advice given to you by your health care provider. Make sure you  discuss any questions you have with your health care provider. Document Released: 06/16/2005 Document Revised: 11/16/2017 Document Reviewed: 11/16/2017 Elsevier Interactive Patient Education  2019 [employer].

## 2018-11-19 NOTE — Assessment & Plan Note (Signed)
Likely bacterial  Start augmentin otc cold medications, allergy medication Rest, fluid Call if no improvement

## 2018-12-01 ENCOUNTER — Other Ambulatory Visit: Payer: Self-pay | Admitting: Internal Medicine

## 2018-12-04 ENCOUNTER — Other Ambulatory Visit: Payer: Self-pay | Admitting: *Deleted

## 2018-12-04 DIAGNOSIS — Z20822 Contact with and (suspected) exposure to covid-19: Secondary | ICD-10-CM

## 2018-12-06 LAB — NOVEL CORONAVIRUS, NAA: SARS-CoV-2, NAA: NOT DETECTED

## 2018-12-22 NOTE — Progress Notes (Addendum)
Subjective:   Hannah [name] is a 68 y.o. female who presents for Medicare Annual (Subsequent) preventive examination. I connected with patient by a telephone and verified that I am speaking with the correct person using two identifiers. Patient stated full name and DOB. Patient gave permission to continue with telephonic visit. Patient's location was at home and Nurse's location was at [place] office.   Review of Systems:     Sleep patterns: feels rested on waking, gets up 0-1 times nightly to void and sleeps 6-7 hours nightly.    Home Safety/Smoke Alarms: Feels safe in home. Smoke alarms in place.  Living environment; residence and Firearm Safety: 1-story house/ trailer. Lives with husband, no needs for DME, good support system  Seat Belt Safety/Bike Helmet: Wears seat belt.     Objective:     Vitals: There were no vitals taken for this visit.  There is no height or weight on file to calculate BMI.  Advanced Directives 06/04/2018 05/18/2018 12/21/2017 12/23/2016 04/24/2016 04/10/2016 02/13/2014  Does Patient Have a Medical Advance Directive? No No No No No No No  Does patient want to make changes to medical advance directive? - - Yes (ED - Information included in AVS) - - - -  Would patient like information on creating a medical advance directive? Yes (MAU/Ambulatory/Procedural Areas - Information given) Yes (MAU/Ambulatory/Procedural Areas - Information given) - - - - -    Tobacco Social History   Tobacco Use  Smoking Status Light Tobacco Smoker  . Packs/day: 0.50  . Years: 40.00  . Pack years: 20.00  . Types: Cigarettes  Smokeless Tobacco Never Used  Tobacco Comment   PACK WILL LAST 3 days     Ready to quit: Not Answered Counseling given: Not Answered Comment: PACK WILL LAST 3 days  Past Medical History:  Diagnosis Date  . Allergy   . Anxiety   . Arthritis   . Carpal tunnel syndrome   . Depression   . Diverticulosis   . Dyslipidemia   . External hemorrhoids    . GERD (gastroesophageal reflux disease)   . Heart murmur    mild-moderate AR  . Hyperlipidemia    on meds   . Hypertension   . Internal hemorrhoids   . Osteoarthritis   . Pre-diabetes   . PVD (peripheral vascular disease) (HCC)    moderate carotid disease  . RBBB   . Smoker   . Tubular adenoma of colon   . Vocal cord polyps    Past Surgical History:  Procedure Laterality Date  . ABDOMINAL HYSTERECTOMY  1994  . COLONOSCOPY    . COLONOSCOPY W/ POLYPECTOMY  2009  . HEMORRHOID SURGERY    . right total knee arthroplasty     Dr. Emeterio Reeve 06-04-18  . SPINE SURGERY  08/16/2009  . TOTAL KNEE ARTHROPLASTY Left 02/13/2014   Procedure: TOTAL KNEE ARTHROPLASTY;  Surgeon: Alta Corning, MD;  Location: [facility];  Service: Orthopedics;  Laterality: Left;  . TOTAL KNEE ARTHROPLASTY Right 06/04/2018   Procedure: RIGHT TOTAL KNEE ARTHROPLASTY;  Surgeon: Dorna Leitz, MD;  Location: [facility];  Service: Orthopedics;  Laterality: Right;  Adductor Block  . TUBAL LIGATION     Family History  Problem Relation Age of Onset  . Cancer Father   . Prostate cancer Father   . Diabetes Sister   . Stroke Maternal Grandfather   . Colon cancer Neg Hx   . Colon polyps Neg Hx   . Rectal cancer Neg Hx   .  Stomach cancer Neg Hx   . Esophageal cancer Neg Hx    Social History   Socioeconomic History  . Marital status: Married    Spouse name: Not on file  . Number of children: 2  . Years of education: Not on file  . Highest education level: Not on file  Occupational History  . Not on file  Social Needs  . Financial resource strain: Not hard at all  . Food insecurity    Worry: Never true    Inability: Never true  . Transportation needs    Medical: No    Non-medical: No  Tobacco Use  . Smoking status: Light Tobacco Smoker    Packs/day: 0.50    Years: 40.00    Pack years: 20.00    Types: Cigarettes  . Smokeless tobacco: Never Used  . Tobacco comment: PACK WILL LAST 3 days  Substance and Sexual  Activity  . Alcohol use: No  . Drug use: No  . Sexual activity: Not Currently  Lifestyle  . Physical activity    Days per week: 2 days    Minutes per session: 60 min  . Stress: To some extent  Relationships  . Social connections    Talks on phone: More than three times a week    Gets together: More than three times a week    Attends religious service: More than 4 times per year    Active member of club or organization: Yes    Attends meetings of clubs or organizations: More than 4 times per year    Relationship status: Married  Other Topics Concern  . Not on file  Social History Narrative  . Not on file    Outpatient Encounter Medications as of 12/23/2018  Medication Sig  . ALPRAZolam (XANAX) 0.5 MG tablet Take 1 tablet (0.5 mg total) by mouth 2 (two) times daily as needed. for sleep  . amoxicillin-clavulanate (AUGMENTIN) 875-125 MG tablet Take 1 tablet by mouth 2 (two) times daily.  . Ascorbic Acid (VITAMIN C PO) Take 1 tablet by mouth daily.  Marland Kitchen aspirin 81 MG tablet Take 1 tablet (81 mg total) by mouth daily.  . bisoprolol-hydrochlorothiazide (ZIAC) 5-6.25 MG tablet Take 0.5 tablets by mouth daily.  . Calcium Carbonate (CALCIUM 600 PO) Take 1,200 mg by mouth every other day.   . cholecalciferol (VITAMIN D) 1000 UNITS tablet Take 1 tablet (1,000 Units total) by mouth daily.  Marland Kitchen estradiol (ESTRACE) 0.5 MG tablet Take 0.5 tablets (0.25 mg total) by mouth daily.  . fenofibrate 160 MG tablet Take 1 tablet (160 mg total) by mouth daily.  . mirtazapine (REMERON) 7.5 MG tablet TAKE 1 TABLET BY MOUTH AT BEDTIME  . omeprazole (PRILOSEC) 40 MG capsule TAKE 1 CAPSULE 2 TIMES DAILY BEFORE LUNCH AND SUPPER.  . pravastatin (PRAVACHOL) 20 MG tablet Take 1 tablet (20 mg total) by mouth every evening.  . Probiotic Product (PROBIOTIC DAILY PO) Take 1 capsule by mouth daily.  . sertraline (ZOLOFT) 25 MG tablet Take 1 tablet (25 mg total) by mouth at bedtime.   No facility-administered encounter  medications on file as of 12/23/2018.     Activities of Daily Living In your present state of health, do you have any difficulty performing the following activities: 06/04/2018 05/18/2018  Hearing? N N  Vision? N N  Difficulty concentrating or making decisions? N N  Walking or climbing stairs? N N  Dressing or bathing? N N  Doing errands, shopping? N N  Some recent  data might be hidden    Patient Care Team: [name], MD as PCP - General (Internal Medicine) Minus Breeding, MD as PCP - Cardiology (Cardiology)    Assessment:   This is a routine wellness examination for Hadiya. Physical assessment deferred to PCP.  Exercise Activities and Dietary recommendations   Diet (meal preparation, eat out, water intake, caffeinated beverages, dairy products, fruits and vegetables): in general, a "healthy" diet  , well balanced eats a variety of fruits and vegetables daily, limits salt, fat/cholesterol, sugar,carbohydrates,caffeine, drinks 6-8 glasses of water daily.  Goals    . LDL CALC < 100     LDL goal < 100 preferably.  Non-HDL goal < 130    . Patient Stated     I want to eat healthy to control cholesterol and prevent diabetes.       Fall Risk Fall Risk  11/19/2018 12/21/2017 03/09/2017 01/05/2016 12/05/2014  Falls in the past year? 0 No No No No  Comment - - - - -  Number falls in past yr: 0 - - - -    Depression Screen PHQ 2/9 Scores 11/19/2018 12/21/2017 03/09/2017 01/05/2016  PHQ - 2 Score 0 0 2 0  PHQ- 9 Score 0 0 - -     Cognitive Function MMSE - Mini Mental State Exam 12/21/2017  Orientation to time 5  Orientation to Place 5  Registration 3  Attention/ Calculation 5  Recall 3  Language- name 2 objects 2  Language- repeat 1  Language- follow 3 step command 3  Language- read & follow direction 1  Write a sentence 1  Copy design 1  Total score 30       Ad8 score reviewed for issues:  Issues making decisions: no  Less interest in hobbies / activities: no   Repeats questions, stories (family complaining): no  Trouble using ordinary gadgets (microwave, computer, phone):no  Forgets the month or year: no  Mismanaging finances: no  Remembering appts: no  Daily problems with thinking and/or memory: no Ad8 score is= 0  Immunization History  Administered Date(s) Administered  . Pneumococcal Conjugate-13 05/30/2014  . Pneumococcal Polysaccharide-23 02/27/2016  . Td 08/01/2004  . Tdap 05/30/2014   Screening Tests Health Maintenance  Topic Date Due  . INFLUENZA VACCINE  01/29/2019  . COLONOSCOPY  04/25/2019  . MAMMOGRAM  04/27/2020  . DEXA SCAN  09/02/2021  . TETANUS/TDAP  05/30/2024  . Hepatitis C Screening  Completed  . PNA vac Low Risk Adult  Completed   Reviewed health maintenance screenings with patient today and relevant education, vaccines, and/or referrals were provided.   Continue to eat heart healthy diet (full of fruits, vegetables, whole grains, lean protein, water--limit salt, fat, and sugar intake) and increase physical activity as tolerated.  Continue doing brain stimulating activities (puzzles, reading, adult coloring books, staying active) to keep memory sharp.   I have personally reviewed and noted the following in the patient's chart:   . Medical and social history . Use of alcohol, tobacco or illicit drugs  . Current medications and supplements . Functional ability and status . Nutritional status . Physical activity . Advanced directives . List of other physicians . Screenings to include cognitive, depression, and falls . Referrals and appointments  In addition, I have reviewed and discussed with patient certain preventive protocols, quality metrics, and best practice recommendations. A written personalized care plan for preventive services as well as general preventive health recommendations were provided to patient.  Michiel Cowboy, RN  12/22/2018   Medical screening examination/treatment/procedure(s)  were performed by non-physician practitioner and as supervising physician I was immediately available for consultation/collaboration. I agree with above. [name], MD

## 2018-12-23 ENCOUNTER — Ambulatory Visit (INDEPENDENT_AMBULATORY_CARE_PROVIDER_SITE_OTHER): Payer: 59 | Admitting: *Deleted

## 2018-12-23 DIAGNOSIS — Z0000 Encounter for general adult medical examination without abnormal findings: Secondary | ICD-10-CM

## 2018-12-23 NOTE — Patient Instructions (Addendum)
Continue doing brain stimulating activities (puzzles, reading, adult coloring books, staying active) to keep memory sharp.   Continue to eat heart healthy diet (full of fruits, vegetables, whole grains, lean protein, water--limit salt, fat, and sugar intake) and increase physical activity as tolerated.   Hannah [name] , Thank you for taking time to come for your Medicare Wellness Visit. I appreciate your ongoing commitment to your health goals. Please review the following plan we discussed and let me know if I can assist you in the future.   These are the goals we discussed: Goals    . LDL CALC < 100     LDL goal < 100 preferably.  Non-HDL goal < 130    . Patient Stated     I want to eat healthy to control cholesterol and prevent diabetes.       This is a list of the screening recommended for you and due dates:  Health Maintenance  Topic Date Due  . Flu Shot  01/29/2019  . Colon Cancer Screening  04/25/2019  . Mammogram  04/27/2020  . DEXA scan (bone density measurement)  09/02/2021  . Tetanus Vaccine  05/30/2024  .  Hepatitis C: One time screening is recommended by Center for Disease Control  (CDC) for  adults born from [birth year] through 1967.   Completed  . Pneumonia vaccines  Completed    Preventive Care 8 Years and Older, Female Preventive care refers to lifestyle choices and visits with your health care provider that can promote health and wellness. What does preventive care include?  A yearly physical exam. This is also called an annual well check.  Dental exams once or twice a year.  Routine eye exams. Ask your health care provider how often you should have your eyes checked.  Personal lifestyle choices, including: ? Daily care of your teeth and gums. ? Regular physical activity. ? Eating a healthy diet. ? Avoiding tobacco and drug use. ? Limiting alcohol use. ? Practicing safe sex. ? Taking low-dose aspirin every day. ? Taking vitamin and mineral supplements as  recommended by your health care provider. What happens during an annual well check? The services and screenings done by your health care provider during your annual well check will depend on your age, overall health, lifestyle risk factors, and family history of disease. Counseling Your health care provider may ask you questions about your:  Alcohol use.  Tobacco use.  Drug use.  Emotional well-being.  Home and relationship well-being.  Sexual activity.  Eating habits.  History of falls.  Memory and ability to understand (cognition).  Work and work Statistician.  Reproductive health.  Screening You may have the following tests or measurements:  Height, weight, and BMI.  Blood pressure.  Lipid and cholesterol levels. These may be checked every 5 years, or more frequently if you are over 14 years old.  Skin check.  Lung cancer screening. You may have this screening every year starting at age 85 if you have a 30-pack-year history of smoking and currently smoke or have quit within the past 15 years.  Colorectal cancer screening. All adults should have this screening starting at age 58 and continuing until age 71. You will have tests every 1-10 years, depending on your results and the type of screening test. People at increased risk should start screening at an earlier age. Screening tests may include: ? Guaiac-based fecal occult blood testing. ? Fecal immunochemical test (FIT). ? Stool DNA test. ? Virtual colonoscopy. ? Sigmoidoscopy.  During this test, a flexible tube with a tiny camera (sigmoidoscope) is used to examine your rectum and lower colon. The sigmoidoscope is inserted through your anus into your rectum and lower colon. ? Colonoscopy. During this test, a long, thin, flexible tube with a tiny camera (colonoscope) is used to examine your entire colon and rectum.  Hepatitis C blood test.  Hepatitis B blood test.  Sexually transmitted disease (STD)  testing.  Diabetes screening. This is done by checking your blood sugar (glucose) after you have not eaten for a while (fasting). You may have this done every 1-3 years.  Bone density scan. This is done to screen for osteoporosis. You may have this done starting at age 69.  Mammogram. This may be done every 1-2 years. Talk to your health care provider about how often you should have regular mammograms. Talk with your health care provider about your test results, treatment options, and if necessary, the need for more tests. Vaccines Your health care provider may recommend certain vaccines, such as:  Influenza vaccine. This is recommended every year.  Tetanus, diphtheria, and acellular pertussis (Tdap, Td) vaccine. You may need a Td booster every 10 years.  Varicella vaccine. You may need this if you have not been vaccinated.  Zoster vaccine. You may need this after age 66.  Measles, mumps, and rubella (MMR) vaccine. You may need at least one dose of MMR if you were born in 1957 or later. You may also need a second dose.  Pneumococcal 13-valent conjugate (PCV13) vaccine. One dose is recommended after age 41.  Pneumococcal polysaccharide (PPSV23) vaccine. One dose is recommended after age 60.  Meningococcal vaccine. You may need this if you have certain conditions.  Hepatitis A vaccine. You may need this if you have certain conditions or if you travel or work in places where you may be exposed to hepatitis A.  Hepatitis B vaccine. You may need this if you have certain conditions or if you travel or work in places where you may be exposed to hepatitis B.  Haemophilus influenzae type b (Hib) vaccine. You may need this if you have certain conditions. Talk to your health care provider about which screenings and vaccines you need and how often you need them. This information is not intended to replace advice given to you by your health care provider. Make sure you discuss any questions you  have with your health care provider. Document Released: 07/13/2015 Document Revised: 08/06/2017 Document Reviewed: 04/17/2015 Elsevier Interactive Patient Education  2019 [employer].

## 2019-01-06 ENCOUNTER — Other Ambulatory Visit: Payer: Self-pay | Admitting: Internal Medicine

## 2019-01-10 ENCOUNTER — Other Ambulatory Visit: Payer: Self-pay

## 2019-01-10 ENCOUNTER — Emergency Department (HOSPITAL_COMMUNITY): Payer: 59

## 2019-01-10 ENCOUNTER — Encounter (HOSPITAL_COMMUNITY): Payer: Self-pay | Admitting: Emergency Medicine

## 2019-01-10 ENCOUNTER — Emergency Department (HOSPITAL_COMMUNITY)
Admission: EM | Admit: 2019-01-10 | Discharge: 2019-01-10 | Disposition: A | Discharge status: Home / Self Care | Payer: 59 | Attending: Emergency Medicine | Admitting: Emergency Medicine

## 2019-01-10 DIAGNOSIS — Z79899 Other long term (current) drug therapy: Secondary | ICD-10-CM | POA: Diagnosis not present

## 2019-01-10 DIAGNOSIS — F17210 Nicotine dependence, cigarettes, uncomplicated: Secondary | ICD-10-CM | POA: Diagnosis not present

## 2019-01-10 DIAGNOSIS — Z7982 Long term (current) use of aspirin: Secondary | ICD-10-CM | POA: Diagnosis not present

## 2019-01-10 DIAGNOSIS — I10 Essential (primary) hypertension: Secondary | ICD-10-CM | POA: Insufficient documentation

## 2019-01-10 DIAGNOSIS — R202 Paresthesia of skin: Secondary | ICD-10-CM | POA: Diagnosis not present

## 2019-01-10 LAB — BASIC METABOLIC PANEL
Anion gap: 7 (ref 5–15)
BUN: 7 mg/dL — ABNORMAL LOW (ref 8–23)
CO2: 27 mmol/L (ref 22–32)
Calcium: 9.8 mg/dL (ref 8.9–10.3)
Chloride: 106 mmol/L (ref 98–111)
Creatinine, Ser: 0.89 mg/dL (ref 0.44–1.00)
GFR calc Af Amer: >60 mL/min (ref >60)
GFR calc non Af Amer: >60 mL/min (ref >60)
Glucose, Bld: 99 mg/dL (ref 70–99)
Potassium: 3.6 mmol/L (ref 3.5–5.1)
Sodium: 140 mmol/L (ref 135–145)

## 2019-01-10 LAB — CBC WITH DIFFERENTIAL/PLATELET
Abs Immature Granulocytes: 0.02 10*3/uL (ref 0.00–0.07)
Basophils Absolute: 0 10*3/uL (ref 0.0–0.1)
Basophils Relative: 1 %
Eosinophils Absolute: 0.2 10*3/uL (ref 0.0–0.5)
Eosinophils Relative: 4 %
HCT: 35.3 % — ABNORMAL LOW (ref 36.0–46.0)
Hemoglobin: 10.9 g/dL — ABNORMAL LOW (ref 12.0–15.0)
Immature Granulocytes: 0 %
Lymphocytes Relative: 38 %
Lymphs Abs: 2.1 10*3/uL (ref 0.7–4.0)
MCH: 27.4 pg (ref 26.0–34.0)
MCHC: 30.9 g/dL (ref 30.0–36.0)
MCV: 88.7 fL (ref 80.0–100.0)
Monocytes Absolute: 0.5 10*3/uL (ref 0.1–1.0)
Monocytes Relative: 9 %
Neutro Abs: 2.6 10*3/uL (ref 1.7–7.7)
Neutrophils Relative %: 48 %
Platelets: 246 10*3/uL (ref 150–400)
RBC: 3.98 MIL/uL (ref 3.87–5.11)
RDW: 12.4 % (ref 11.5–15.5)
WBC: 5.5 10*3/uL (ref 4.0–10.5)
nRBC: 0 % (ref 0.0–0.2)

## 2019-01-10 MED ORDER — PREDNISONE 10 MG (21) PO TBPK: ORAL_TABLET | Freq: Every day | ORAL | 0 refills | Status: DC

## 2019-01-10 NOTE — ED Notes (Signed)
Patient transported to MRI 

## 2019-01-10 NOTE — ED Provider Notes (Signed)
[facility] EMERGENCY DEPARTMENT Provider Note   CSN: [id number] Arrival date & time: 01/10/19  1039    History   Chief Complaint Chief Complaint  Patient presents with   Fall    HPI Hannah [name] is a 68 y.o. female.     68 year old female with past medical history of hypertension, dyslipidemia, peripheral vascular disease, daily smoker presents with complaint of right side body numbness.  Patient states her symptoms started 3 weeks ago, she slipped getting into the shower and nearly fell, catching herself with her right arm.  Patient states her body twisted but she did not fall down.  Patient got into the shower and noticed that the water felt different on the right side of her body than it did on the left.  Patient continues to have this altered sensation affecting her right arm, right leg described as feeling like "blubber."  Patient is not on any blood thinners.  Patient denies changes in vision, speech, gait.  No other complaints or concerns.     Past Medical History:  Diagnosis Date   Allergy    Anxiety    Arthritis    Carpal tunnel syndrome    Depression    Diverticulosis    Dyslipidemia    External hemorrhoids    GERD (gastroesophageal reflux disease)    Heart murmur    mild-moderate AR   Hyperlipidemia    on meds    Hypertension    Internal hemorrhoids    Osteoarthritis    Pre-diabetes    PVD (peripheral vascular disease) (HCC)    moderate carotid disease   RBBB    Smoker    Tubular adenoma of colon    Vocal cord polyps     Patient Active Problem List   Diagnosis Date Noted   Nonspecific abnormal electrocardiogram (ECG) (EKG) 10/20/2018   Educated About Covid-19 Virus Infection 10/20/2018   Weight loss 07/17/2018   Depression 07/17/2018   Cold feeling 07/15/2018   Primary osteoarthritis of right knee 06/04/2018   Acute sinus infection 04/05/2018   Bilateral carotid artery stenosis 09/24/2017    Aortic valve sclerosis 09/24/2017   Tobacco abuse 09/24/2017   Vocal cord polyps 09/07/2017   Dysphagia 09/07/2017   Cervical strain 07/24/2017   Prediabetes 03/09/2017   Chest pain 12/29/2016   GERD (gastroesophageal reflux disease) 02/27/2016   Hormone replacement therapy (HRT) 02/27/2016   Anxiety 02/27/2016   Osteoarthritis of left knee 02/19/2014   S/P total knee replacement 02/13/2014   RBBB 02/08/2014   HTN (hypertension) 02/08/2014   PVD - bilateral 60-79% carotid strenosis 02/08/2014   Mixed hyperlipidemia 11/29/2013   Carpal tunnel syndrome, bilateral 11/23/2013   DJD (degenerative joint disease) 11/23/2013   Murmur- mild -mod AR, mild MR 11/10/2013   Chronic constipation 12/16/2012   Arthritis 10/08/2012   Nicotine abuse 12/29/2011    Past Surgical History:  Procedure Laterality Date   ABDOMINAL HYSTERECTOMY  1994   COLONOSCOPY     COLONOSCOPY W/ POLYPECTOMY  2009   HEMORRHOID SURGERY     JOINT REPLACEMENT     right total knee arthroplasty     Dr. Emeterio Reeve 06-04-18   SPINE SURGERY  08/16/2009   TOTAL KNEE ARTHROPLASTY Left 02/13/2014   Procedure: TOTAL KNEE ARTHROPLASTY;  Surgeon: Alta Corning, MD;  Location: [place];  Service: Orthopedics;  Laterality: Left;   TOTAL KNEE ARTHROPLASTY Right 06/04/2018   Procedure: RIGHT TOTAL KNEE ARTHROPLASTY;  Surgeon: Dorna Leitz, MD;  Location: [facility];  Service: Orthopedics;  Laterality: Right;  Adductor Block   TUBAL LIGATION       OB History   No obstetric history on file.      Home Medications    Prior to Admission medications   Medication Sig Start Date End Date Taking? Authorizing Provider  ALPRAZolam Duanne Moron) 0.5 MG tablet Take 1 tablet (0.5 mg total) by mouth 2 (two) times daily as needed. for sleep 09/10/18  Yes Burns, Claudina Lick, MD  Ascorbic Acid (VITAMIN C PO) Take 1,000 mg by mouth daily.    Yes [provider]  aspirin 81 MG tablet Take 1 tablet (81 mg total) by mouth  daily. 09/10/18  Yes Burns, Claudina Lick, MD  bisoprolol-hydrochlorothiazide (ZIAC) 5-6.25 MG tablet Take 0.5 tablets by mouth daily. 09/10/18  Yes Burns, Claudina Lick, MD  Calcium Carbonate (CALCIUM 600 PO) Take 1,200 mg by mouth every other day.    Yes [provider]  cholecalciferol (VITAMIN D) 1000 UNITS tablet Take 1 tablet (1,000 Units total) by mouth daily. 05/30/14  Yes English, Colletta [place] D, PA  estradiol (ESTRACE) 0.5 MG tablet Take 0.5 tablets (0.25 mg total) by mouth daily. Patient taking differently: Take 0.25 mg by mouth as needed (hysterectomy).  09/10/18  Yes Burns, Claudina Lick, MD  fenofibrate 160 MG tablet Take 1 tablet (160 mg total) by mouth daily. 09/10/18  Yes Burns, Claudina Lick, MD  Glycerin-Hypromellose-PEG 400 ([facility] DRY EYE RELIEF) 0.2-0.2-1 % SOLN Place 1 drop into both eyes 2 (two) times a day.   Yes [provider]  mirtazapine (REMERON) 7.5 MG tablet TAKE 1 TABLET BY MOUTH AT BEDTIME Patient taking differently: Take 7.5 mg by mouth at bedtime.  01/06/19  Yes Burns, Claudina Lick, MD  naphazoline-glycerin (CLEAR EYES REDNESS) 0.012-0.2 % SOLN Place 1-2 drops into both eyes 4 (four) times daily as needed for eye irritation. bausch and lomb   Yes [provider]  omeprazole (PRILOSEC) 40 MG capsule TAKE 1 CAPSULE 2 TIMES DAILY BEFORE LUNCH AND SUPPER. Patient taking differently: Take 40 mg by mouth 2 (two) times a day.  09/10/18  Yes Burns, Claudina Lick, MD  pravastatin (PRAVACHOL) 20 MG tablet Take 1 tablet (20 mg total) by mouth every evening. 09/10/18  Yes Burns, Claudina Lick, MD  Probiotic Product (PROBIOTIC DAILY PO) Take 1 capsule by mouth daily.   Yes [provider]  sertraline (ZOLOFT) 25 MG tablet Take 1 tablet (25 mg total) by mouth at bedtime. 09/10/18  Yes Burns, Claudina Lick, MD  predniSONE (STERAPRED UNI-PAK 21 TAB) 10 MG (21) TBPK tablet Take by mouth daily. Take 6 tabs by mouth daily  for 2 days, then 5 tabs for 2 days, then 4 tabs for 2 days, then 3 tabs for 2 days, 2  tabs for 2 days, then 1 tab by mouth daily for 2 days 01/10/19   Tacy Learn, PA-C    Family History Family History  Problem Relation Age of Onset   Cancer Father    Prostate cancer Father    Diabetes Sister    Stroke Maternal Grandfather    Colon cancer Neg Hx    Colon polyps Neg Hx    Rectal cancer Neg Hx    Stomach cancer Neg Hx    Esophageal cancer Neg Hx     Social History Social History   Tobacco Use   Smoking status: Light Tobacco Smoker    Packs/day: 0.25    Years: 40.00    Pack years: 10.00  Types: Cigarettes   Smokeless tobacco: Never Used   Tobacco comment: PACK WILL LAST 3 days  Substance Use Topics   Alcohol use: No   Drug use: No     Allergies   Amlodipine, Chantix [varenicline tartrate], Clarithromycin, Lisinopril, Simvastatin, Wellbutrin [bupropion hcl], and Lipitor [atorvastatin]   Review of Systems Review of Systems  Constitutional: Negative for fever.  Respiratory: Negative for shortness of breath.   Cardiovascular: Negative for chest pain.  Musculoskeletal: Negative for arthralgias and myalgias.  Skin: Negative for rash and wound.  Allergic/Immunologic: Negative for immunocompromised state.  Neurological: Positive for numbness. Negative for dizziness, facial asymmetry, speech difficulty, weakness and headaches.  Psychiatric/Behavioral: Negative for confusion.  All other systems reviewed and are negative.    Physical Exam Updated Vital Signs BP (!) 176/102 (BP Location: Right Arm)    Pulse (!) 55    Temp 98.3 F (36.8 C) (Oral)    Resp 14    Ht 5' (1.524 m)    Wt 57.6 kg    SpO2 99%    BMI 24.80 kg/m   Physical Exam Vitals signs and nursing note reviewed.  Constitutional:      Appearance: Normal appearance. She is not ill-appearing.  HENT:     Head: Normocephalic and atraumatic.     Nose: Nose normal.     Mouth/Throat:     Mouth: Mucous membranes are moist.  Eyes:     Extraocular Movements: Extraocular movements  intact.     Pupils: Pupils are equal, round, and reactive to light.  Neck:     Musculoskeletal: Neck supple.  Cardiovascular:     Rate and Rhythm: Normal rate and regular rhythm.     Pulses: Normal pulses.     Heart sounds: Normal heart sounds.  Pulmonary:     Effort: Pulmonary effort is normal.     Breath sounds: Normal breath sounds.  Musculoskeletal:        General: No swelling, tenderness, deformity or signs of injury.     Right lower leg: No edema.     Left lower leg: No edema.  Skin:    General: Skin is warm and dry.     Findings: No bruising, erythema or rash.  Neurological:     Mental Status: She is alert and oriented to person, place, and time.     Sensory: Sensory deficit present.     Motor: No weakness.     Coordination: Coordination normal.     Deep Tendon Reflexes: Reflexes normal.  Psychiatric:        Mood and Affect: Mood normal.        Behavior: Behavior normal.      ED Treatments / Results  Labs (all labs ordered are listed, but only abnormal results are displayed) Labs Reviewed  BASIC METABOLIC PANEL - Abnormal; Notable for the following components:      Result Value   BUN 7 (*)    All other components within normal limits  CBC WITH DIFFERENTIAL/PLATELET - Abnormal; Notable for the following components:   Hemoglobin 10.9 (*)    HCT 35.3 (*)    All other components within normal limits    EKG None  Radiology Mr Brain Wo Contrast  Result Date: 01/10/2019 CLINICAL DATA:  Numbness and tingling.  Right-sided numbness EXAM: MRI HEAD WITHOUT CONTRAST TECHNIQUE: Multiplanar, multiecho pulse sequences of the brain and surrounding structures were obtained without intravenous contrast. COMPARISON:  None. FINDINGS: Brain: Ventricle size and cerebral volume normal. Negative for acute  infarct. Mild white matter changes with scattered small white matter hyperintensities left greater than right. These are mostly in the deep white matter, with confluent  hyperintensity in the left periventricular white matter. Negative for hemorrhage mass or edema. No midline shift. Vascular: Normal arterial flow voids Skull and upper cervical spine: Negative Sinuses/Orbits: Negative Other: None IMPRESSION: No acute abnormality.  Negative for acute infarct or mass Mild chronic white matter changes. Given the patient's history this is most likely due to chronic microvascular ischemia, mild for age. Electronically Signed   By: Franchot Gallo M.D.   On: 01/10/2019 14:04   Mr Cervical Spine Wo Contrast  Result Date: 01/10/2019 CLINICAL DATA:  Numbness and tingling, paresthesia. Right-sided numbness. Right arm pain EXAM: MRI CERVICAL SPINE WITHOUT CONTRAST TECHNIQUE: Multiplanar, multisequence MR imaging of the cervical spine was performed. No intravenous contrast was administered. COMPARISON:  None. FINDINGS: Alignment: Normal alignment.  Mild cervical kyphosis. Vertebrae: Negative for fracture or mass. Cord: No cord lesion.  Normal cord signal. Posterior Fossa, vertebral arteries, paraspinal tissues: Negative Disc levels: C2-3: Disc bulging and spurring right greater than left. Moderate right foraminal encroachment and mild spinal stenosis C3-4: Moderate facet degeneration right greater than left. Disc degeneration and uncinate spurring bilaterally. Moderate foraminal encroachment right greater than left. Mild to moderate spinal stenosis C4-5: Disc degeneration and spondylosis with diffuse uncinate spurring. Bilateral facet hypertrophy. Moderate foraminal stenosis bilaterally with mild spinal stenosis. C5-6: Moderate disc degeneration and spondylosis with diffuse uncinate spurring and central disc protrusion. Cord flattening with mild spinal stenosis. Moderate foraminal stenosis bilaterally C6-7: Disc degeneration and spondylosis. Diffuse uncinate spurring causing moderate foraminal encroachment bilaterally. Mild spinal stenosis C7-T1: Mild disc degeneration. Negative for spinal or  foraminal stenosis T1-2: Central and right-sided disc protrusion. Severe right foraminal encroachment due to disc protrusion and spurring. Moderate left foraminal encroachment. IMPRESSION: Extensive disc and facet degeneration throughout the cervical spine. Multilevel cervical spine stenosis and foraminal stenosis at multiple levels as described above. Electronically Signed   By: Franchot Gallo M.D.   On: 01/10/2019 14:10    Procedures Procedures (including critical care time)  Medications Ordered in ED Medications - No data to display   Initial Impression / Assessment and Plan / ED Course  I have reviewed the triage vital signs and the nursing notes.  Pertinent labs & imaging results that were available during my care of the patient were reviewed by me and considered in my medical decision making (see chart for details).  Clinical Course as of Jan 09 1449  Mon Jan 10, 2019  1441 68yo female with complaint of right side body numbness x 3 weeks. On exam, normal arm and leg strength, reports altered sensation to diffuse right arm and leg as well as lower anterior abdominal area. MRI brain/c-spine, negative for CVA or traumatic changes. MRI and case reviewed with Dr. Gilford Raid, ER attending, patient will be dc on prensione course with referral to neurosurgery. Return to ER for new or worsening symptoms.    [LM]    Clinical Course User Index [LM] Tacy Learn, PA-C      Final Clinical Impressions(s) / ED Diagnoses   Final diagnoses:  Paresthesia    ED Discharge Orders         Ordered    predniSONE (STERAPRED UNI-PAK 21 TAB) 10 MG (21) TBPK tablet  Daily     01/10/19 1432           Tacy Learn, PA-C 01/10/19 1450  Isla Pence, MD 01/15/19 1504

## 2019-01-10 NOTE — Discharge Instructions (Addendum)
Follow up with neurosurgery, referral given. Take Prednisone as prescribed and complete the full course. Return to ER for new or worsening symptoms.

## 2019-01-10 NOTE — ED Triage Notes (Signed)
Pt slipped getting into tub about 3 week ago. States she twisted and grabbed wall to keep from falling. States her right side feels numb, pain in right arm, right foot is swollen and she is having difficulty having bowel movements.

## 2019-02-08 ENCOUNTER — Other Ambulatory Visit: Payer: Self-pay | Admitting: Internal Medicine

## 2019-02-15 ENCOUNTER — Other Ambulatory Visit: Payer: Self-pay

## 2019-02-15 MED ORDER — SERTRALINE HCL 25 MG PO TABS: 25.0000 mg | ORAL_TABLET | Freq: Every day | ORAL | 0 refills | Status: DC

## 2019-02-23 ENCOUNTER — Other Ambulatory Visit: Payer: Self-pay | Admitting: Internal Medicine

## 2019-03-13 NOTE — Progress Notes (Signed)
Subjective:    Patient ID: Hannah [name], female    DOB: 04/18/1951, 68 y.o.   MRN: [id number]  HPI The patient is here for follow up.  She is exercising some.    Hypertension: She is taking her medication daily. She is compliant with a low sodium diet.  She denies chest pain, palpitations, edema, shortness of breath and regular headaches. She does monitor her blood pressure at home - it has been elevated.    Hyperlipidemia: She is taking her medication daily. She is compliant with a low fat/cholesterol diet. She denies myalgias.   GERD:  She is taking her medication daily as prescribed.  She denies any GERD symptoms and feels her GERD is well controlled.  She does feel bloated after eating.    Anxiety: She is taking alprazolam as needed and that is very helpful.  She has been taking the sertraline nightly and is unsure if she really needs it or not.  She is interested in trying to go without it.  Depression: She has been taking both the mirtazapine and sertraline.  At this point she no longer feels depressed since that was more situational depression.  She is interested in coming off of medications if possible.  Prediabetes:  She is fairly compliant with a low sugar/carbohydrate diet.  She is exercising minimally.  HRT:  She has weaned off the estradiol.  She is doing ok without it.    Tobacco abuse:  She is still smoking.  At this point she does not want to quit.   Medications and allergies reviewed with patient and updated if appropriate.  Patient Active Problem List   Diagnosis Date Noted  . Nonspecific abnormal electrocardiogram (ECG) (EKG) 10/20/2018  . Educated About Covid-19 Virus Infection 10/20/2018  . Weight loss 07/17/2018  . Depression 07/17/2018  . Cold feeling 07/15/2018  . Primary osteoarthritis of right knee 06/04/2018  . Bilateral carotid artery stenosis 09/24/2017  . Aortic valve sclerosis 09/24/2017  . Vocal cord polyps 09/07/2017  . Dysphagia  09/07/2017  . Cervical strain 07/24/2017  . Prediabetes 03/09/2017  . Chest pain 12/29/2016  . GERD (gastroesophageal reflux disease) 02/27/2016  . Hormone replacement therapy (HRT) 02/27/2016  . Anxiety 02/27/2016  . Osteoarthritis of left knee 02/19/2014  . S/P total knee replacement 02/13/2014  . RBBB 02/08/2014  . HTN (hypertension) 02/08/2014  . PVD - bilateral 60-79% carotid strenosis 02/08/2014  . Mixed hyperlipidemia 11/29/2013  . Carpal tunnel syndrome, bilateral 11/23/2013  . DJD (degenerative joint disease) 11/23/2013  . Murmur- mild -mod AR, mild MR 11/10/2013  . Chronic constipation 12/16/2012  . Arthritis 10/08/2012  . Nicotine abuse 12/29/2011    Current Outpatient Medications on File Prior to Visit  Medication Sig Dispense Refill  . ALPRAZolam (XANAX) 0.5 MG tablet Take 1 tablet (0.5 mg total) by mouth 2 (two) times daily as needed. for sleep 60 tablet 5  . Ascorbic Acid (VITAMIN C PO) Take 1,000 mg by mouth daily.     Marland Kitchen aspirin 81 MG tablet Take 1 tablet (81 mg total) by mouth daily. 30 tablet   . bisoprolol-hydrochlorothiazide (ZIAC) 5-6.25 MG tablet Take 1/2 (one-half) tablet by mouth once daily 45 tablet 0  . Calcium Carbonate (CALCIUM 600 PO) Take 1,200 mg by mouth every other day.     . cholecalciferol (VITAMIN D) 1000 UNITS tablet Take 1 tablet (1,000 Units total) by mouth daily. 30 tablet 5  . fenofibrate 160 MG tablet Take 1 tablet (160 mg  total) by mouth daily. 90 tablet 1  . Glycerin-Hypromellose-PEG 400 ([facility] DRY EYE RELIEF) 0.2-0.2-1 % SOLN Place 1 drop into both eyes 2 (two) times a day.    . mirtazapine (REMERON) 7.5 MG tablet Take 1 tablet (7.5 mg total) by mouth at bedtime. Follow-up appt due in Sept must see provider for future refills 30 tablet 0  . naphazoline-glycerin (CLEAR EYES REDNESS) 0.012-0.2 % SOLN Place 1-2 drops into both eyes 4 (four) times daily as needed for eye irritation. bausch and lomb    . omeprazole (PRILOSEC) 40 MG capsule TAKE 1  CAPSULE 2 TIMES DAILY BEFORE LUNCH AND SUPPER. (Patient taking differently: Take 40 mg by mouth 2 (two) times a day. ) 180 capsule 1  . pravastatin (PRAVACHOL) 20 MG tablet Take 1 tablet (20 mg total) by mouth every evening. 90 tablet 1  . Probiotic Product (PROBIOTIC DAILY PO) Take 1 capsule by mouth daily.    . sertraline (ZOLOFT) 25 MG tablet Take 1 tablet (25 mg total) by mouth at bedtime. 90 tablet 0   No current facility-administered medications on file prior to visit.     Past Medical History:  Diagnosis Date  . Allergy   . Anxiety   . Arthritis   . Carpal tunnel syndrome   . Depression   . Diverticulosis   . Dyslipidemia   . External hemorrhoids   . GERD (gastroesophageal reflux disease)   . Heart murmur    mild-moderate AR  . Hyperlipidemia    on meds   . Hypertension   . Internal hemorrhoids   . Osteoarthritis   . Pre-diabetes   . PVD (peripheral vascular disease) (HCC)    moderate carotid disease  . RBBB   . Smoker   . Tubular adenoma of colon   . Vocal cord polyps     Past Surgical History:  Procedure Laterality Date  . ABDOMINAL HYSTERECTOMY  1994  . COLONOSCOPY    . COLONOSCOPY W/ POLYPECTOMY  2009  . HEMORRHOID SURGERY    . JOINT REPLACEMENT    . right total knee arthroplasty     Dr. Emeterio Reeve 06-04-18  . SPINE SURGERY  08/16/2009  . TOTAL KNEE ARTHROPLASTY Left 02/13/2014   Procedure: TOTAL KNEE ARTHROPLASTY;  Surgeon: Alta Corning, MD;  Location: [facility];  Service: Orthopedics;  Laterality: Left;  . TOTAL KNEE ARTHROPLASTY Right 06/04/2018   Procedure: RIGHT TOTAL KNEE ARTHROPLASTY;  Surgeon: Dorna Leitz, MD;  Location: [facility];  Service: Orthopedics;  Laterality: Right;  Adductor Block  . TUBAL LIGATION      Social History   Socioeconomic History  . Marital status: Married    Spouse name: Not on file  . Number of children: 2  . Years of education: Not on file  . Highest education level: Not on file  Occupational History  . Not on file  Social  Needs  . Financial resource strain: Not hard at all  . Food insecurity    Worry: Never true    Inability: Never true  . Transportation needs    Medical: No    Non-medical: No  Tobacco Use  . Smoking status: Light Tobacco Smoker    Packs/day: 0.25    Years: 40.00    Pack years: 10.00    Types: Cigarettes  . Smokeless tobacco: Never Used  . Tobacco comment: PACK WILL LAST 3 days  Substance and Sexual Activity  . Alcohol use: No  . Drug use: No  . Sexual activity: Not  Currently  Lifestyle  . Physical activity    Days per week: 2 days    Minutes per session: 60 min  . Stress: To some extent  Relationships  . Social connections    Talks on phone: More than three times a week    Gets together: More than three times a week    Attends religious service: More than 4 times per year    Active member of club or organization: Yes    Attends meetings of clubs or organizations: More than 4 times per year    Relationship status: Married  Other Topics Concern  . Not on file  Social History Narrative  . Not on file    Family History  Problem Relation Age of Onset  . Cancer Father   . Prostate cancer Father   . Diabetes Sister   . Stroke Maternal Grandfather   . Colon cancer Neg Hx   . Colon polyps Neg Hx   . Rectal cancer Neg Hx   . Stomach cancer Neg Hx   . Esophageal cancer Neg Hx     Review of Systems  Constitutional: Negative for chills and fever.  HENT: Positive for trouble swallowing (pills and food - chronic, no change).   Respiratory: Negative for cough, shortness of breath and wheezing.   Cardiovascular: Negative for chest pain, palpitations and leg swelling.  Gastrointestinal: Negative for abdominal pain.  Neurological: Negative for light-headedness and headaches.       Objective:   Vitals:   03/14/19 0848  BP: (!) 184/82  Pulse: 69  Resp: 16  Temp: 98.5 F (36.9 C)  SpO2: 96%   BP Readings from Last 3 Encounters:  03/14/19 (!) 184/82  01/10/19 (!)  176/102  11/19/18 124/72   Wt Readings from Last 3 Encounters:  03/14/19 129 lb (58.5 kg)  01/10/19 127 lb (57.6 kg)  11/19/18 125 lb (56.7 kg)   Body mass index is 25.19 kg/m.   Physical Exam    Constitutional: Appears well-developed and well-nourished. No distress.  HENT:  Head: Normocephalic and atraumatic.  Neck: Neck supple. No tracheal deviation present. No thyromegaly present.  No cervical lymphadenopathy Cardiovascular: Normal rate, regular rhythm and normal heart sounds.   2/6 systolic murmur heard.  Bilateral carotid bruit .  No edema Pulmonary/Chest: Effort normal and breath sounds normal. No respiratory distress. No has no wheezes. No rales.  Skin: Skin is warm and dry. Not diaphoretic.  Psychiatric: Normal mood and affect. Behavior is normal.      Assessment & Plan:    See Problem List for Assessment and Plan of chronic medical problems.

## 2019-03-13 NOTE — Patient Instructions (Addendum)
  Tests ordered today. Your results will be released to [facility] (or called to you) after review.  If any changes need to be made, you will be notified at that same time.    Medications reviewed and updated.  Changes include :   Increase your BP medication to 1 pill daily.  Stop the mirtazapine and then stop the sertraline.  Try to decrease omeprazole to once a day.     Your prescription(s) have been submitted to your pharmacy. Please take as directed and contact our office if you believe you are having problem(s) with the medication(s).   Please followup in 6 months

## 2019-03-14 ENCOUNTER — Other Ambulatory Visit: Payer: Self-pay

## 2019-03-14 ENCOUNTER — Other Ambulatory Visit (INDEPENDENT_AMBULATORY_CARE_PROVIDER_SITE_OTHER): Payer: Medicare HMO

## 2019-03-14 ENCOUNTER — Encounter: Payer: Self-pay | Admitting: Internal Medicine

## 2019-03-14 ENCOUNTER — Ambulatory Visit (INDEPENDENT_AMBULATORY_CARE_PROVIDER_SITE_OTHER): Payer: Medicare HMO | Admitting: Internal Medicine

## 2019-03-14 VITALS — BP 184/82 | HR 69 | Temp 98.5°F | Resp 16 | Ht 60.0 in | Wt 129.0 lb

## 2019-03-14 DIAGNOSIS — I10 Essential (primary) hypertension: Secondary | ICD-10-CM

## 2019-03-14 DIAGNOSIS — R7303 Prediabetes: Secondary | ICD-10-CM | POA: Diagnosis not present

## 2019-03-14 DIAGNOSIS — E782 Mixed hyperlipidemia: Secondary | ICD-10-CM

## 2019-03-14 DIAGNOSIS — F3289 Other specified depressive episodes: Secondary | ICD-10-CM | POA: Diagnosis not present

## 2019-03-14 DIAGNOSIS — Z23 Encounter for immunization: Secondary | ICD-10-CM | POA: Diagnosis not present

## 2019-03-14 DIAGNOSIS — F419 Anxiety disorder, unspecified: Secondary | ICD-10-CM

## 2019-03-14 DIAGNOSIS — Z79890 Hormone replacement therapy (postmenopausal): Secondary | ICD-10-CM

## 2019-03-14 DIAGNOSIS — K219 Gastro-esophageal reflux disease without esophagitis: Secondary | ICD-10-CM

## 2019-03-14 LAB — CBC WITH DIFFERENTIAL/PLATELET
Basophils Absolute: 0.1 10*3/uL (ref 0.0–0.1)
Basophils Relative: 1.3 % (ref 0.0–3.0)
Eosinophils Absolute: 0.3 10*3/uL (ref 0.0–0.7)
Eosinophils Relative: 5.2 % — ABNORMAL HIGH (ref 0.0–5.0)
HCT: 37.1 % (ref 36.0–46.0)
Hemoglobin: 11.9 g/dL — ABNORMAL LOW (ref 12.0–15.0)
Lymphocytes Relative: 41 % (ref 12.0–46.0)
Lymphs Abs: 2.2 10*3/uL (ref 0.7–4.0)
MCHC: 32.1 g/dL (ref 30.0–36.0)
MCV: 83 fl (ref 78.0–100.0)
Monocytes Absolute: 0.6 10*3/uL (ref 0.1–1.0)
Monocytes Relative: 10.6 % (ref 3.0–12.0)
Neutro Abs: 2.3 10*3/uL (ref 1.4–7.7)
Neutrophils Relative %: 41.9 % — ABNORMAL LOW (ref 43.0–77.0)
Platelets: 252 10*3/uL (ref 150.0–400.0)
RBC: 4.47 Mil/uL (ref 3.87–5.11)
RDW: 12.9 % (ref 11.5–15.5)
WBC: 5.4 10*3/uL (ref 4.0–10.5)

## 2019-03-14 LAB — COMPREHENSIVE METABOLIC PANEL
ALT: 11 U/L (ref 0–35)
AST: 19 U/L (ref 0–37)
Albumin: 4.1 g/dL (ref 3.5–5.2)
Alkaline Phosphatase: 41 U/L (ref 39–117)
BUN: 10 mg/dL (ref 6–23)
CO2: 31 mEq/L (ref 19–32)
Calcium: 9.9 mg/dL (ref 8.4–10.5)
Chloride: 105 mEq/L (ref 96–112)
Creatinine, Ser: 0.85 mg/dL (ref 0.40–1.20)
GFR: 79.87 mL/min (ref >60.00)
Glucose, Bld: 93 mg/dL (ref 70–99)
Potassium: 3.7 mEq/L (ref 3.5–5.1)
Sodium: 143 mEq/L (ref 135–145)
Total Bilirubin: 0.3 mg/dL (ref 0.2–1.2)
Total Protein: 6.6 g/dL (ref 6.0–8.3)

## 2019-03-14 LAB — LIPID PANEL
Cholesterol: 151 mg/dL (ref 0–200)
HDL: 37.4 mg/dL — ABNORMAL LOW (ref >39.00)
LDL Cholesterol: 74 mg/dL (ref 0–99)
NonHDL: 113.65
Total CHOL/HDL Ratio: 4
Triglycerides: 197 mg/dL — ABNORMAL HIGH (ref 0.0–149.0)
VLDL: 39.4 mg/dL (ref 0.0–40.0)

## 2019-03-14 LAB — HEMOGLOBIN A1C: Hgb A1c MFr Bld: 6.6 % — ABNORMAL HIGH (ref 4.6–6.5)

## 2019-03-14 MED ORDER — BISOPROLOL-HYDROCHLOROTHIAZIDE 5-6.25 MG PO TABS: 1.0000 | ORAL_TABLET | Freq: Every day | ORAL | 1 refills | Status: DC

## 2019-03-14 MED ORDER — PRAVASTATIN SODIUM 20 MG PO TABS: 20.0000 mg | ORAL_TABLET | Freq: Every evening | ORAL | 1 refills | Status: DC

## 2019-03-14 MED ORDER — OMEPRAZOLE 40 MG PO CPDR: 40.0000 mg | DELAYED_RELEASE_CAPSULE | Freq: Two times a day (BID) | ORAL | 1 refills | Status: DC

## 2019-03-14 MED ORDER — FENOFIBRATE 160 MG PO TABS: 160.0000 mg | ORAL_TABLET | Freq: Every day | ORAL | 1 refills | Status: DC

## 2019-03-14 MED ORDER — ALPRAZOLAM 0.5 MG PO TABS: 0.5000 mg | ORAL_TABLET | Freq: Two times a day (BID) | ORAL | 5 refills | Status: DC | PRN

## 2019-03-14 NOTE — Assessment & Plan Note (Signed)
Check a1c Low sugar / carb diet Stressed regular exercise   

## 2019-03-14 NOTE — Assessment & Plan Note (Signed)
Denies any depression I think it is reasonable to discontinue the mirtazapine and sertraline since her depression was more situational She will go ahead and stop the mirtazapine and then wait before stopping the sertraline

## 2019-03-14 NOTE — Assessment & Plan Note (Signed)
Check lipid panel  Continue daily statin Regular exercise and healthy diet encouraged  

## 2019-03-14 NOTE — Assessment & Plan Note (Signed)
Successfully weaned off estradiol and doing okay

## 2019-03-14 NOTE — Assessment & Plan Note (Signed)
GERD controlled Continue Omeprazole twice daily - consider trying once a day

## 2019-03-14 NOTE — Assessment & Plan Note (Signed)
Not controlled A while ago we decreased her blood pressure medication because her blood pressure was low, but since elevated we will increase back to 1 pill daily She will monitor at home and if it is not better controlled she will call or return CMP

## 2019-03-14 NOTE — Assessment & Plan Note (Signed)
She would like to discontinue the sertraline, which I think is reasonable She will continue the alprazolam as needed only

## 2019-03-17 ENCOUNTER — Other Ambulatory Visit: Payer: Self-pay

## 2019-03-17 ENCOUNTER — Other Ambulatory Visit (HOSPITAL_COMMUNITY): Payer: Self-pay | Admitting: Cardiology

## 2019-03-17 ENCOUNTER — Ambulatory Visit (HOSPITAL_COMMUNITY)
Admission: RE | Admit: 2019-03-17 | Discharge: 2019-03-17 | Disposition: A | Discharge status: Home / Self Care | Payer: Medicare HMO | Source: Ambulatory Visit | Attending: Cardiology | Admitting: Cardiology

## 2019-03-17 DIAGNOSIS — I6523 Occlusion and stenosis of bilateral carotid arteries: Secondary | ICD-10-CM | POA: Insufficient documentation

## 2019-04-05 ENCOUNTER — Encounter: Payer: Self-pay | Admitting: Internal Medicine

## 2019-04-11 ENCOUNTER — Telehealth: Payer: Self-pay | Admitting: Internal Medicine

## 2019-04-11 ENCOUNTER — Encounter: Payer: Self-pay | Admitting: Internal Medicine

## 2019-04-11 MED ORDER — SERTRALINE HCL 25 MG PO TABS: 25.0000 mg | ORAL_TABLET | Freq: Every day | ORAL | 1 refills | Status: DC

## 2019-04-11 MED ORDER — MIRTAZAPINE 7.5 MG PO TABS: 7.5000 mg | ORAL_TABLET | Freq: Every day | ORAL | 0 refills | Status: DC

## 2019-04-11 NOTE — Telephone Encounter (Signed)
Ok to fill 

## 2019-04-11 NOTE — Telephone Encounter (Signed)
Ok to refill 

## 2019-04-11 NOTE — Telephone Encounter (Signed)
Rx sent to pharmacy   

## 2019-04-11 NOTE — Telephone Encounter (Signed)
[facility] nurse to give pt a call, wanting refills on Mirtazapine and Sertraline, not in listed  Meds, pt states that she had them taken off the list but Dr Quay Burow states she can have refills anytime she wants. FU with pt

## 2019-04-25 DIAGNOSIS — Z1231 Encounter for screening mammogram for malignant neoplasm of breast: Secondary | ICD-10-CM | POA: Diagnosis not present

## 2019-05-01 NOTE — Progress Notes (Signed)
Cardiology Office Note   Date:  05/02/2019   ID:  PROMISE Hannah [name], DOB [birth year], MRN [id number]  PCP:  [name], MD  Cardiologist:   Minus Breeding, MD   No chief complaint on file.    History of Present Illness: Hannah [name] is a 68 y.o. female who presents for who presents for follow up of  PVD and a murmur. She had mild sclerosis on her aortic valve with mild aortic insufficiency.    Since I last saw her she has done well.  She walks twice a week.  She did have an episode of right-sided numbness after twisting.  She was in the emergency room in July and I reviewed this.  It was not thought to be an acute central neurologic etiology.  She has extensive disc and facet degenerative changes in her cervical spine thought possibly to be related.  The patient denies any new symptoms such as chest discomfort, neck or arm discomfort. There has been no new shortness of breath, PND or orthopnea. There have been no reported palpitations, presyncope or syncope.  I reviewed the ER records for this presentation.  Past Medical History:  Diagnosis Date  . Allergy   . Anxiety   . Arthritis   . Carpal tunnel syndrome   . Depression   . Diverticulosis   . Dyslipidemia   . External hemorrhoids   . GERD (gastroesophageal reflux disease)   . Heart murmur    mild-moderate AR  . Hyperlipidemia    on meds   . Hypertension   . Internal hemorrhoids   . Osteoarthritis   . Pre-diabetes   . PVD (peripheral vascular disease) (HCC)    moderate carotid disease  . RBBB   . Smoker   . Tubular adenoma of colon   . Vocal cord polyps     Past Surgical History:  Procedure Laterality Date  . ABDOMINAL HYSTERECTOMY  1994  . COLONOSCOPY    . COLONOSCOPY W/ POLYPECTOMY  2009  . HEMORRHOID SURGERY    . JOINT REPLACEMENT    . right total knee arthroplasty     Dr. Emeterio Reeve 06-04-18  . SPINE SURGERY  08/16/2009  . TOTAL KNEE ARTHROPLASTY Left 02/13/2014   Procedure: TOTAL KNEE ARTHROPLASTY;   Surgeon: Alta Corning, MD;  Location: [facility];  Service: Orthopedics;  Laterality: Left;  . TOTAL KNEE ARTHROPLASTY Right 06/04/2018   Procedure: RIGHT TOTAL KNEE ARTHROPLASTY;  Surgeon: Dorna Leitz, MD;  Location: [facility];  Service: Orthopedics;  Laterality: Right;  Adductor Block  . TUBAL LIGATION       Current Outpatient Medications  Medication Sig Dispense Refill  . ALPRAZolam (XANAX) 0.5 MG tablet Take 1 tablet (0.5 mg total) by mouth 2 (two) times daily as needed. for sleep 60 tablet 5  . Ascorbic Acid (VITAMIN C PO) Take 1,000 mg by mouth daily.     Marland Kitchen aspirin 81 MG tablet Take 1 tablet (81 mg total) by mouth daily. 30 tablet   . bisoprolol-hydrochlorothiazide (ZIAC) 5-6.25 MG tablet Take 1 tablet by mouth daily. 90 tablet 1  . Calcium Carbonate (CALCIUM 600 PO) Take 1,200 mg by mouth every other day.     . cholecalciferol (VITAMIN D) 1000 UNITS tablet Take 1 tablet (1,000 Units total) by mouth daily. 30 tablet 5  . fenofibrate 160 MG tablet Take 1 tablet (160 mg total) by mouth daily. 90 tablet 1  . Glycerin-Hypromellose-PEG 400 ([facility] DRY EYE RELIEF) 0.2-0.2-1 % SOLN Place  1 drop into both eyes 2 (two) times a day.    . mirtazapine (REMERON) 7.5 MG tablet Take 1 tablet (7.5 mg total) by mouth at bedtime. 90 tablet 0  . naphazoline-glycerin (CLEAR EYES REDNESS) 0.012-0.2 % SOLN Place 1-2 drops into both eyes 4 (four) times daily as needed for eye irritation. bausch and lomb    . omeprazole (PRILOSEC) 40 MG capsule Take 1 capsule (40 mg total) by mouth 2 (two) times daily before a meal. 180 capsule 1  . pravastatin (PRAVACHOL) 40 MG tablet Take 1 tablet (40 mg total) by mouth every evening. 90 tablet 3  . Probiotic Product (PROBIOTIC DAILY PO) Take 1 capsule by mouth daily.    . sertraline (ZOLOFT) 25 MG tablet Take 1 tablet (25 mg total) by mouth at bedtime. 90 tablet 1   No current facility-administered medications for this visit.     Allergies:   Amlodipine, Chantix [varenicline  tartrate], Clarithromycin, Lisinopril, Simvastatin, Wellbutrin [bupropion hcl], and Lipitor [atorvastatin]    ROS:  Please see the history of present illness.   Otherwise, review of systems are positive for none.   All other systems are reviewed and negative.    PHYSICAL EXAM: VS:  BP 136/71   Pulse 60   Temp (!) 96.8 F (36 C)   Ht 5' (1.524 m)   Wt 124 lb (56.2 kg)   SpO2 100%   BMI 24.22 kg/m  , BMI Body mass index is 24.22 kg/m. GENERAL:  Well appearing NECK:  No jugular venous distention, waveform within normal limits, carotid upstroke brisk and symmetric, no bruits, no thyromegaly LUNGS:  Clear to auscultation bilaterally CHEST:  Unremarkable HEART:  PMI not displaced or sustained,S1 and S2 within normal limits, no S3, no S4, no clicks, no rubs, 2 out of 6 apical systolic murmur early peaking radiating slightly at the aortic outflow tract, no diastolic murmurs ABD:  Flat, positive bowel sounds normal in frequency in pitch, no bruits, no rebound, no guarding, no midline pulsatile mass, no hepatomegaly, no splenomegaly EXT:  2 plus pulses throughout, no edema, no cyanosis no clubbing   EKG:  EKG is ordered today. The ekg ordered today demonstrates sinus rhythm, rate 62, right bundle branch block, left anterior fascicular block no change from previous.   Recent Labs: 07/16/2018: TSH 0.76 03/14/2019: ALT 11; BUN 10; Creatinine, Ser 0.85; Hemoglobin 11.9; Platelets 252.0; Potassium 3.7; Sodium 143    Lipid Panel    Component Value Date/Time   CHOL 151 03/14/2019 0945   TRIG 197.0 (H) 03/14/2019 0945   HDL 37.40 (L) 03/14/2019 0945   CHOLHDL 4 03/14/2019 0945   VLDL 39.4 03/14/2019 0945   LDLCALC 74 03/14/2019 0945   LDLDIRECT 87.0 11/09/2014 0819      Wt Readings from Last 3 Encounters:  05/02/19 124 lb (56.2 kg)  03/14/19 129 lb (58.5 kg)  01/10/19 127 lb (57.6 kg)      Other studies Reviewed: Additional studies/ records that were reviewed today include: ED  records. Review of the above records demonstrates:  Please see elsewhere in the note.     ASSESSMENT AND PLAN:  AS/AI: This is mild aortic sclerosis and AI on echo in 2015.  I would not suspect that this is change clinically.  No further imaging is indicated.   RBBB/LAFB:   We talked before about any bradycardia arrhythmia symptoms and she is not having these.  She would let me know.   CAROTID STENOSIS: She has 40 - 59%  bilateral carotid stenosis in Sept.  I will follow up in one year.  I reviewed these results with her.  DYSLIPIDEMIA: LDL was slightly elevated at 74.  Again increase her pravastatin to 40 mg daily.   DM: Her hemoglobin A1c is 6.6 recently and she is changing her diet.  We talked about specifics of this.  TOBACCO:  Have asked her to quit smoking completely again.   BRADYCARDIA:    As above.  HYPERTENSION: Her BP was controlled today but she thinks it is elevated at home and she is going to keep a 2-week blood pressure diary.  Make adjustments based on this.    Current medicines are reviewed at length with the patient today.  The patient does not have concerns regarding medicines.  The following changes have been made:  As above  Labs/ tests ordered today include: None  Orders Placed This Encounter  Procedures  . EKG 12-Lead     Disposition:   FU with me in six months.     Signed, Minus Breeding, MD  05/02/2019 10:53 AM    [facility]

## 2019-05-02 ENCOUNTER — Ambulatory Visit: Payer: Medicare HMO | Admitting: Cardiology

## 2019-05-02 ENCOUNTER — Encounter: Payer: Self-pay | Admitting: Cardiology

## 2019-05-02 ENCOUNTER — Other Ambulatory Visit: Payer: Self-pay

## 2019-05-02 VITALS — BP 136/71 | HR 60 | Temp 96.8°F | Ht 60.0 in | Wt 124.0 lb

## 2019-05-02 DIAGNOSIS — I6523 Occlusion and stenosis of bilateral carotid arteries: Secondary | ICD-10-CM | POA: Diagnosis not present

## 2019-05-02 DIAGNOSIS — I10 Essential (primary) hypertension: Secondary | ICD-10-CM

## 2019-05-02 DIAGNOSIS — Z720 Tobacco use: Secondary | ICD-10-CM | POA: Diagnosis not present

## 2019-05-02 DIAGNOSIS — E785 Hyperlipidemia, unspecified: Secondary | ICD-10-CM | POA: Diagnosis not present

## 2019-05-02 DIAGNOSIS — I359 Nonrheumatic aortic valve disorder, unspecified: Secondary | ICD-10-CM | POA: Diagnosis not present

## 2019-05-02 DIAGNOSIS — R9431 Abnormal electrocardiogram [ECG] [EKG]: Secondary | ICD-10-CM

## 2019-05-02 MED ORDER — PRAVASTATIN SODIUM 40 MG PO TABS: 40.0000 mg | ORAL_TABLET | Freq: Every evening | ORAL | 3 refills | Status: DC

## 2019-05-02 NOTE — Patient Instructions (Signed)
Medication Instructions:  Your physician has recommended you make the following change in your medication:   INCREASE YOUR PRAVASTATIN TO 40 MG BY MOUTH DAILY  *If you need a refill on your cardiac medications before your next appointment, please call your pharmacy*  Lab Work: NONE If you have labs (blood work) drawn today and your tests are completely normal, you will receive your results only by: Marland Kitchen MyChart Message (if you have MyChart) OR . A paper copy in the mail If you have any lab test that is abnormal or we need to change your treatment, we will call you to review the results.  Testing/Procedures: NONE  Follow-Up: At [facility], you and your health needs are our priority.  As part of our continuing mission to provide you with exceptional heart care, we have created designated Provider Care Teams.  These Care Teams include your primary Cardiologist (physician) and Advanced Practice Providers (APPs -  Physician Assistants and Nurse Practitioners) who all work together to provide you with the care you need, when you need it.  Your next appointment:   6 months  The format for your next appointment:   In Person  Provider:   Minus Breeding, MD

## 2019-05-06 ENCOUNTER — Ambulatory Visit (AMBULATORY_SURGERY_CENTER): Payer: Medicare HMO | Admitting: *Deleted

## 2019-05-06 ENCOUNTER — Other Ambulatory Visit: Payer: Self-pay

## 2019-05-06 ENCOUNTER — Encounter: Payer: Self-pay | Admitting: Internal Medicine

## 2019-05-06 VITALS — Temp 97.1°F | Ht 61.5 in | Wt 121.0 lb

## 2019-05-06 DIAGNOSIS — Z86010 Personal history of colonic polyps: Secondary | ICD-10-CM

## 2019-05-06 DIAGNOSIS — Z1159 Encounter for screening for other viral diseases: Secondary | ICD-10-CM

## 2019-05-06 MED ORDER — SUPREP BOWEL PREP KIT 17.5-3.13-1.6 GM/177ML PO SOLN: 1.0000 | Freq: Once | ORAL | 0 refills | Status: AC

## 2019-05-06 NOTE — Progress Notes (Signed)
covid test 11-17 at 0940 am   No egg or soy allergy known to patient  No issues with past sedation with any surgeries  or procedures, no intubation problems  No diet pills per patient No home 02 use per patient  No blood thinners per patient  Pt states  issues with constipation - uses Senna C and Linzess as needed for this issue  No A fib or A flutter  EMMI video sent to pt's e mail   Due to the COVID-19 pandemic we are asking patients to follow these guidelines. Please only bring one care partner. Please be aware that your care partner may wait in the car in the parking lot or if they feel like they will be too hot to wait in the car, they may wait in the lobby on the 4th floor. All care partners are required to wear a mask the entire time (we do not have any that we can provide them), they need to practice social distancing, and we will do a Covid check for all patient's and care partners when you arrive. Also we will check their temperature and your temperature. If the care partner waits in their car they need to stay in the parking lot the entire time and we will call them on their cell phone when the patient is ready for discharge so they can bring the car to the front of the building. Also all patient's will need to wear a mask into building.

## 2019-05-18 ENCOUNTER — Other Ambulatory Visit: Payer: Self-pay | Admitting: Internal Medicine

## 2019-05-18 DIAGNOSIS — Z1159 Encounter for screening for other viral diseases: Secondary | ICD-10-CM | POA: Diagnosis not present

## 2019-05-19 LAB — SARS CORONAVIRUS 2 (TAT 6-24 HRS): SARS Coronavirus 2: NEGATIVE

## 2019-05-20 ENCOUNTER — Other Ambulatory Visit: Payer: Self-pay

## 2019-05-20 ENCOUNTER — Ambulatory Visit (AMBULATORY_SURGERY_CENTER): Payer: Medicare HMO | Admitting: Internal Medicine

## 2019-05-20 ENCOUNTER — Encounter: Payer: Self-pay | Admitting: Internal Medicine

## 2019-05-20 VITALS — BP 132/49 | HR 66 | Temp 98.0°F | Resp 18 | Ht 61.5 in | Wt 121.0 lb

## 2019-05-20 DIAGNOSIS — I10 Essential (primary) hypertension: Secondary | ICD-10-CM | POA: Diagnosis not present

## 2019-05-20 DIAGNOSIS — K6289 Other specified diseases of anus and rectum: Secondary | ICD-10-CM | POA: Diagnosis not present

## 2019-05-20 DIAGNOSIS — Z86010 Personal history of colonic polyps: Secondary | ICD-10-CM

## 2019-05-20 DIAGNOSIS — D123 Benign neoplasm of transverse colon: Secondary | ICD-10-CM | POA: Diagnosis not present

## 2019-05-20 DIAGNOSIS — D128 Benign neoplasm of rectum: Secondary | ICD-10-CM

## 2019-05-20 DIAGNOSIS — D127 Benign neoplasm of rectosigmoid junction: Secondary | ICD-10-CM | POA: Diagnosis not present

## 2019-05-20 DIAGNOSIS — I739 Peripheral vascular disease, unspecified: Secondary | ICD-10-CM | POA: Diagnosis not present

## 2019-05-20 DIAGNOSIS — K621 Rectal polyp: Secondary | ICD-10-CM | POA: Diagnosis not present

## 2019-05-20 DIAGNOSIS — K623 Rectal prolapse: Secondary | ICD-10-CM

## 2019-05-20 DIAGNOSIS — D125 Benign neoplasm of sigmoid colon: Secondary | ICD-10-CM

## 2019-05-20 MED ORDER — SODIUM CHLORIDE 0.9 % IV SOLN: 500.0000 mL | INTRAVENOUS | Status: DC

## 2019-05-20 NOTE — Progress Notes (Signed)
Called to room to assist during endoscopic procedure.  Patient ID and intended procedure confirmed with present staff. Received instructions for my participation in the procedure from the performing physician.  

## 2019-05-20 NOTE — Op Note (Signed)
[place] Patient Name: Hannah [name] Procedure Date: 05/20/2019 12:11 PM MRN: [id number] Endoscopist: Jerene Bears , MD Age: 68 Referring MD:  Date of Birth: [birth year] Gender: Female Account #: [id number] Procedure:                Colonoscopy Indications:              High risk colon cancer surveillance: Personal                            history of multiple (3 or more) adenomas, Last                            colonoscopy 3 years ago Medicines:                Monitored Anesthesia Care Procedure:                Pre-Anesthesia Assessment:                           - Prior to the procedure, a History and Physical                            was performed, and patient medications and                            allergies were reviewed. The patient's tolerance of                            previous anesthesia was also reviewed. The risks                            and benefits of the procedure and the sedation                            options and risks were discussed with the patient.                            All questions were answered, and informed consent                            was obtained. Prior Anticoagulants: The patient has                            taken no previous anticoagulant or antiplatelet                            agents. ASA Grade Assessment: II - A patient with                            mild systemic disease. After reviewing the risks                            and benefits, the patient was deemed in  satisfactory condition to undergo the procedure.                           After obtaining informed consent, the colonoscope                            was passed under direct vision. Throughout the                            procedure, the patient's blood pressure, pulse, and                            oxygen saturations were monitored continuously. The                            Colonoscope was introduced through the anus  and                            advanced to the cecum, identified by appendiceal                            orifice and ileocecal valve. The colonoscopy was                            performed without difficulty. The patient tolerated                            the procedure well. The quality of the bowel                            preparation was good. The ileocecal valve,                            appendiceal orifice, and rectum were photographed.                            The bowel preparation used was 2 day MiraLax +                            Suprep via split dose instruction. Scope In: 12:20:21 PM Scope Out: 12:38:58 PM Scope Withdrawal Time: 0 hours 14 minutes 53 seconds  Total Procedure Duration: 0 hours 18 minutes 37 seconds  Findings:                 Hemorrhoids were found on perianal exam.                           Two sessile polyps were found in the transverse                            colon. The polyps were 3 to 4 mm in size. These                            polyps were removed with a cold  snare. Resection                            and retrieval were complete.                           A 5 mm polyp was found in the sigmoid colon. The                            polyp was sessile. The polyp was removed with a                            cold snare. Resection and retrieval were complete.                           A 4 mm polyp was found in the proximal rectum. The                            polyp was sessile. The polyp was removed with a                            cold snare. Resection and retrieval were complete.                           An area of congested mucosa was found in the distal                            rectum. Query prolapse versus stercoral change.                            This was biopsied with a cold forceps for histology                            to exclude adenomatous change.                           Multiple small-mouthed diverticula were found in                             the sigmoid colon and descending colon.                           Internal hemorrhoids were found during                            retroflexion. The hemorrhoids were small. Complications:            No immediate complications. Estimated Blood Loss:     Estimated blood loss was minimal. Impression:               - Hemorrhoids found on perianal exam.                           - Two 3 to 4 mm polyps in the transverse colon,  removed with a cold snare. Resected and retrieved.                           - One 5 mm polyp in the sigmoid colon, removed with                            a cold snare. Resected and retrieved.                           - One 4 mm polyp in the proximal rectum, removed                            with a cold snare. Resected and retrieved.                           - Congested mucosa in the distal rectum. Biopsied.                           - Diverticulosis in the sigmoid colon and in the                            descending colon.                           - Internal hemorrhoids. Recommendation:           - Patient has a contact number available for                            emergencies. The signs and symptoms of potential                            delayed complications were discussed with the                            patient. Return to normal activities tomorrow.                            Written discharge instructions were provided to the                            patient.                           - Resume previous diet.                           - Continue present medications.                           - Await pathology results.                           - Repeat colonoscopy is recommended for  surveillance. The colonoscopy date will be                            determined after pathology results from today's                            exam become available for review. Jerene Bears,  MD 05/20/2019 12:43:34 PM This report has been signed electronically.

## 2019-05-20 NOTE — Progress Notes (Signed)
Robbin checked in, Milam IV, JB temp. KA VS. Pt's states no medical or surgical changes since previsit

## 2019-05-20 NOTE — Progress Notes (Signed)
Report given to PACU, vss 

## 2019-05-20 NOTE — Patient Instructions (Signed)
YOU HAD AN ENDOSCOPIC PROCEDURE TODAY AT THE [facility]:   Refer to the procedure report that was given to you for any specific questions about what was found during the examination.  If the procedure report does not answer your questions, please call your gastroenterologist to clarify.  If you requested that your care partner not be given the details of your procedure findings, then the procedure report has been included in a sealed envelope for you to review at your convenience later.  YOU SHOULD EXPECT: Some feelings of bloating in the abdomen. Passage of more gas than usual.  Walking can help get rid of the air that was put into your GI tract during the procedure and reduce the bloating. If you had a lower endoscopy (such as a colonoscopy or flexible sigmoidoscopy) you may notice spotting of blood in your stool or on the toilet paper. If you underwent a bowel prep for your procedure, you may not have a normal bowel movement for a few days.  Please Note:  You might notice some irritation and congestion in your nose or some drainage.  This is from the oxygen used during your procedure.  There is no need for concern and it should clear up in a day or so.  SYMPTOMS TO REPORT IMMEDIATELY:   Following lower endoscopy (colonoscopy or flexible sigmoidoscopy):  Excessive amounts of blood in the stool  Significant tenderness or worsening of abdominal pains  Swelling of the abdomen that is new, acute  Fever of 100F or higher  For urgent or emergent issues, a gastroenterologist can be reached at any hour by calling [phone].   DIET:  We do recommend a small meal at first, but then you may proceed to your regular diet.  Drink plenty of fluids but you should avoid alcoholic beverages for 24 hours.  ACTIVITY:  You should plan to take it easy for the rest of today and you should NOT DRIVE or use heavy machinery until tomorrow (because of the sedation medicines used during the test).     FOLLOW UP: Our staff will call the number listed on your records 48-72 hours following your procedure to check on you and address any questions or concerns that you may have regarding the information given to you following your procedure. If we do not reach you, we will leave a message.  We will attempt to reach you two times.  During this call, we will ask if you have developed any symptoms of COVID 19. If you develop any symptoms (ie: fever, flu-like symptoms, shortness of breath, cough etc.) before then, please call [phone].  If you test positive for Covid 19 in the 2 weeks post procedure, please call and report this information to us.    If any biopsies were taken you will be contacted by phone or by letter within the next 1-3 weeks.  Please call us at [phone] if you have not heard about the biopsies in 3 weeks.    SIGNATURES/CONFIDENTIALITY: You and/or your care partner have signed paperwork which will be entered into your electronic medical record.  These signatures attest to the fact that that the information above on your After Visit Summary has been reviewed and is understood.  Full responsibility of the confidentiality of this discharge information lies with you and/or your care-partner. 

## 2019-05-24 ENCOUNTER — Telehealth: Payer: Self-pay

## 2019-05-24 NOTE — Telephone Encounter (Signed)
  Follow up Call-  Call back number 05/20/2019 01/08/2018  Post procedure Call Back phone  # [phone]  Permission to leave phone message Yes Yes  Some recent data might be hidden     Patient questions:  Do you have a fever, pain , or abdominal swelling? No. Pain Score  0 *  Have you tolerated food without any problems? Yes.    Have you been able to return to your normal activities? Yes.    Do you have any questions about your discharge instructions: Diet   No. Medications  No. Follow up visit  No.  Do you have questions or concerns about your Care? No.  Actions: * If pain score is 4 or above: No action needed, pain <4.  1. Have you developed a fever since your procedure? no  2.   Have you had an respiratory symptoms (SOB or cough) since your procedure? no  3.   Have you tested positive for COVID 19 since your procedure no  4.   Have you had any family members/close contacts diagnosed with the COVID 19 since your procedure?  no   If yes to any of these questions please route to Joylene John, RN and Alphonsa Gin, Therapist, sports.

## 2019-05-25 ENCOUNTER — Encounter: Payer: Self-pay | Admitting: *Deleted

## 2019-05-25 ENCOUNTER — Other Ambulatory Visit: Payer: Self-pay

## 2019-05-31 ENCOUNTER — Encounter: Payer: Self-pay | Admitting: Internal Medicine

## 2019-05-31 ENCOUNTER — Ambulatory Visit (INDEPENDENT_AMBULATORY_CARE_PROVIDER_SITE_OTHER): Payer: Medicare HMO | Admitting: Internal Medicine

## 2019-05-31 ENCOUNTER — Other Ambulatory Visit: Payer: Self-pay

## 2019-05-31 VITALS — BP 110/60 | HR 68 | Temp 97.4°F | Ht 61.5 in | Wt 123.0 lb

## 2019-05-31 DIAGNOSIS — Z86010 Personal history of colonic polyps: Secondary | ICD-10-CM

## 2019-05-31 DIAGNOSIS — K219 Gastro-esophageal reflux disease without esophagitis: Secondary | ICD-10-CM

## 2019-05-31 DIAGNOSIS — R198 Other specified symptoms and signs involving the digestive system and abdomen: Secondary | ICD-10-CM | POA: Diagnosis not present

## 2019-05-31 DIAGNOSIS — K623 Rectal prolapse: Secondary | ICD-10-CM

## 2019-05-31 DIAGNOSIS — K5909 Other constipation: Secondary | ICD-10-CM

## 2019-05-31 NOTE — Patient Instructions (Addendum)
If you are age 70 or older, your body mass index should be between 23-30. Your Body mass index is 22.86 kg/m. If this is out of the aforementioned range listed, please consider follow up with your Primary Care Provider.  If you are age 75 or younger, your body mass index should be between 19-25. Your Body mass index is 22.86 kg/m. If this is out of the aformentioned range listed, please consider follow up with your Primary Care Provider.   Continue senna but try to reduce your intake to 8 tablets daily.  Continue Prilosec.  You will be contacted by [facility] to be scheduled for an MRI of the pelvis to evaluate defecatory dysfunction. They are located at [address] . Their number is [phone].  Thank you for entrusting me with your care and for choosing [facility], Dr. Zenovia Jarred

## 2019-05-31 NOTE — Progress Notes (Signed)
Subjective:    Patient ID: Hannah [name], female    DOB: 19-May-1951, 68 y.o.   MRN: [id number]  HPI Hannah [name] is a 68 year old female with a history of chronic constipation, hemorrhoids, adenomatous colon polyps, probable rectal prolapse, GERD, hypertension, hyperlipidemia who is here for follow-up today.  She is here today with her daughter.  I saw her recently for a colonoscopy performed for surveillance on 05/20/2019.  This colonoscopy revealed hemorrhoids.  4 polyps being removed from the transverse colon, sigmoid colon and proximal rectum.  2 of the 4 were adenomatous the other 2 hyperplastic.  There was an area of discrete congested and edematous mucosa in the distal rectum.  This was biopsied and showed prolapse related changes and fibromuscular hypertrophy.  No dysplasia.  There was left-sided diverticulosis and small hemorrhoids.  She continues to have issue with chronic constipation.  She takes a very high dose of senna.  She takes 10 senna tablets at bedtime.  She is worked her way up slowly through the years to help with her constipation.  She used to take 14 tablets a day but is decreased to 10.  She has 2-3 bowel movements per day which are soft but formed.  This does not cause diarrhea.  She does see mucus in her stool feels often incomplete defecation and pressure in her pelvis and rectum.  She does see occasional bright red blood with bowel movement and wiping.  She is not having anterior abdominal pain.  Her reflux is well controlled and she is not having any further dysphagia.  She is using omeprazole 40 mg twice a day.   Review of Systems As per HPI, otherwise negative  Current Medications, Allergies, Past Medical History, Past Surgical History, Family History and Social History were reviewed in [employer] record.     Objective:   Physical Exam BP 110/60   Pulse 68   Temp (!) 97.4 F (36.3 C)   Ht 5' 1.5" (1.562 m)   Wt 123 lb (55.8 kg)    BMI 22.86 kg/m  Gen: awake, alert, NAD HEENT: anicteric Neuro: nonfocal  CBC    Component Value Date/Time   WBC 5.4 03/14/2019 0945   RBC 4.47 03/14/2019 0945   HGB 11.9 (L) 03/14/2019 0945   HCT 37.1 03/14/2019 0945   PLT 252.0 03/14/2019 0945   MCV 83.0 03/14/2019 0945   MCV 80.8 09/16/2015 1138   MCH 27.4 01/10/2019 1150   MCHC 32.1 03/14/2019 0945   RDW 12.9 03/14/2019 0945   LYMPHSABS 2.2 03/14/2019 0945   MONOABS 0.6 03/14/2019 0945   EOSABS 0.3 03/14/2019 0945   BASOSABS 0.1 03/14/2019 0945   CMP     Component Value Date/Time   NA 143 03/14/2019 0945   K 3.7 03/14/2019 0945   CL 105 03/14/2019 0945   CO2 31 03/14/2019 0945   GLUCOSE 93 03/14/2019 0945   BUN 10 03/14/2019 0945   CREATININE 0.85 03/14/2019 0945   CREATININE 0.91 09/16/2015 1135   CALCIUM 9.9 03/14/2019 0945   PROT 6.6 03/14/2019 0945   ALBUMIN 4.1 03/14/2019 0945   AST 19 03/14/2019 0945   ALT 11 03/14/2019 0945   ALKPHOS 41 03/14/2019 0945   BILITOT 0.3 03/14/2019 0945   GFRNONAA >60 01/10/2019 1150   GFRNONAA 65 09/16/2015 1135   GFRAA >60 01/10/2019 1150   GFRAA 76 09/16/2015 1135       Assessment & Plan:  68 year old female with a history of chronic constipation,  hemorrhoids, adenomatous colon polyps, probable rectal prolapse, GERD, hypertension, hyperlipidemia who is here for follow-up today.   1.  Chronic constipation/rectal prolapse and pressure/dysfunctional defecation --we had a long discussion today regarding her chronic constipation and the rectal prolapse change seen at colonoscopy.  Histology supported prolapse change in the rectum.  While there are likely 2 issues one being chronic constipation the other being rectal prolapse and pelvic floor dysfunction leading to abnormal defecation.  She is on a very high dose of senna, higher than I would like her to be but it is effective for her to have bowel movement.  She had tried Linzess before but this just caused diarrhea.  We  discussed how I would like to better objectify what is going on with the rectum as it relates to her bowel movement.  We discussed dynamic pelvic MRI versus anorectal manometry.  She may benefit from pelvic floor physical therapy or even rectal prolapse surgery.  We will proceed as follows --MR defecography  --We will continue senna today.  Though I suggested she try to reduce the dose slowly to determine what is actually effective to have 1-2 soft but formed stools daily --Further treatment based on findings of MRI  2.  GERD --currently well controlled continue omeprazole 40 mg 1-2 times daily  3.  History of colon polyps --2 polyps adenomatous at last colonoscopy though history of multiple prior adenomatous. --Surveillance colonoscopy in 5 years, November 2025

## 2019-06-14 ENCOUNTER — Ambulatory Visit: Payer: Medicare HMO | Admitting: Internal Medicine

## 2019-06-18 ENCOUNTER — Ambulatory Visit
Admission: RE | Admit: 2019-06-18 | Discharge: 2019-06-18 | Disposition: A | Discharge status: Home / Self Care | Payer: Medicare HMO | Source: Ambulatory Visit | Attending: Internal Medicine | Admitting: Internal Medicine

## 2019-06-18 ENCOUNTER — Other Ambulatory Visit: Payer: Self-pay

## 2019-06-18 DIAGNOSIS — R198 Other specified symptoms and signs involving the digestive system and abdomen: Secondary | ICD-10-CM

## 2019-06-18 DIAGNOSIS — N8110 Cystocele, unspecified: Secondary | ICD-10-CM | POA: Diagnosis not present

## 2019-06-18 DIAGNOSIS — K5909 Other constipation: Secondary | ICD-10-CM

## 2019-06-18 DIAGNOSIS — K623 Rectal prolapse: Secondary | ICD-10-CM

## 2019-07-06 DIAGNOSIS — K469 Unspecified abdominal hernia without obstruction or gangrene: Secondary | ICD-10-CM | POA: Diagnosis not present

## 2019-07-06 DIAGNOSIS — K623 Rectal prolapse: Secondary | ICD-10-CM | POA: Diagnosis not present

## 2019-07-06 DIAGNOSIS — K5902 Outlet dysfunction constipation: Secondary | ICD-10-CM | POA: Diagnosis not present

## 2019-07-06 DIAGNOSIS — N816 Rectocele: Secondary | ICD-10-CM | POA: Diagnosis not present

## 2019-07-20 ENCOUNTER — Telehealth: Payer: Self-pay

## 2019-07-20 NOTE — Telephone Encounter (Signed)
Copied from [place] [id number]. Topic: General - Inquiry >> Jul 20, 2019 10:00 AM Virl Axe D wrote: Reason for CRM: Pt would like to know if Dr. Quay Burow thinks it is okay for her to receive the Covid 19 vaccine. Requesting callback.

## 2019-07-20 NOTE — Telephone Encounter (Signed)
Pt aware of response.

## 2019-07-20 NOTE — Telephone Encounter (Signed)
Yes, she should get the covid vaccine.

## 2019-08-18 ENCOUNTER — Ambulatory Visit: Payer: Medicare HMO

## 2019-08-22 ENCOUNTER — Ambulatory Visit: Payer: Medicare HMO | Attending: Family

## 2019-08-22 DIAGNOSIS — Z23 Encounter for immunization: Secondary | ICD-10-CM | POA: Insufficient documentation

## 2019-08-22 NOTE — Progress Notes (Signed)
   Covid-19 Vaccination Clinic  Name:  Hannah [name]    MRN: [id number] DOB: 1951-05-24  08/22/2019  [name] was observed post Covid-19 immunization for 15 minutes without incidence. She was provided with Vaccine Information Sheet and instruction to access the V-Safe system.   [name] was instructed to call 911 with any severe reactions post vaccine: Marland Kitchen Difficulty breathing  . Swelling of your face and throat  . A fast heartbeat  . A bad rash all over your body  . Dizziness and weakness    Immunizations Administered    Name Date Dose VIS Date Route   Moderna COVID-19 Vaccine 08/22/2019  9:53 AM 0.5 mL 05/31/2019 Intramuscular   Manufacturer: Moderna   Lot: [id number]   [place]: [id number]

## 2019-08-29 ENCOUNTER — Other Ambulatory Visit: Payer: Self-pay | Admitting: Internal Medicine

## 2019-09-11 NOTE — Progress Notes (Signed)
Subjective:    Patient ID: Hannah [name], female    DOB: 01-May-1951, 69 y.o.   MRN: [id number]  HPI The patient is here for follow up of their chronic medical problems, including hypertension, hyperlipidemia, GERD, anxiety, depression, prediabetes, tobacco abuse.    She is taking all of her medications as prescribed.   She is exercising a little - walking.     Dysuria - it started two days ago.  She denies other urine symptoms.  She denies fever, abdominal pain and nausea.  Diffuse achiness.  She thinks this is probably arthritis.  She does have significant arthritis and takes about 800 Advil once a day and that seems to help.  Right clavicle larger and is intermittently sore.     Medications and allergies reviewed with patient and updated if appropriate.  Patient Active Problem List   Diagnosis Date Noted  . Dysuria 09/12/2019  . Nonspecific abnormal electrocardiogram (ECG) (EKG) 10/20/2018  . Educated about COVID-19 virus infection 10/20/2018  . Depression 07/17/2018  . Primary osteoarthritis of right knee 06/04/2018  . Bilateral carotid artery stenosis 09/24/2017  . Aortic valve sclerosis 09/24/2017  . Vocal cord polyps 09/07/2017  . Dysphagia 09/07/2017  . Cervical strain 07/24/2017  . Diabetes mellitus without complication ([place]) 93/71/6967  . Chest pain 12/29/2016  . GERD (gastroesophageal reflux disease) 02/27/2016  . Anxiety 02/27/2016  . Osteoarthritis of left knee 02/19/2014  . S/P total knee replacement 02/13/2014  . RBBB 02/08/2014  . HTN (hypertension) 02/08/2014  . PVD - bilateral 60-79% carotid strenosis 02/08/2014  . Mixed hyperlipidemia 11/29/2013  . Carpal tunnel syndrome, bilateral 11/23/2013  . DJD (degenerative joint disease) 11/23/2013  . Murmur- mild -mod AR, mild MR 11/10/2013  . Chronic constipation 12/16/2012  . Arthritis 10/08/2012  . Nicotine abuse 12/29/2011    Current Outpatient Medications on File Prior to Visit  Medication Sig  Dispense Refill  . Ascorbic Acid (VITAMIN C PO) Take 1,000 mg by mouth daily.     Marland Kitchen aspirin 81 MG tablet Take 1 tablet (81 mg total) by mouth daily. 30 tablet   . bisoprolol-hydrochlorothiazide (ZIAC) 5-6.25 MG tablet TAKE 1 TABLET EVERY DAY 90 tablet 0  . Calcium Carbonate (CALCIUM 600 PO) Take 1,200 mg by mouth every other day.     . cholecalciferol (VITAMIN D) 1000 UNITS tablet Take 1 tablet (1,000 Units total) by mouth daily. 30 tablet 5  . fenofibrate 160 MG tablet TAKE 1 TABLET EVERY DAY 90 tablet 1  . Glycerin-Hypromellose-PEG 400 ([facility] DRY EYE RELIEF) 0.2-0.2-1 % SOLN Place 1 drop into both eyes 2 (two) times a day.    . naphazoline-glycerin (CLEAR EYES REDNESS) 0.012-0.2 % SOLN Place 1-2 drops into both eyes 4 (four) times daily as needed for eye irritation. bausch and lomb    . omeprazole (PRILOSEC) 40 MG capsule TAKE 1 CAPSULE TWICE DAILY BEFORE MEALS 180 capsule 0  . pravastatin (PRAVACHOL) 40 MG tablet Take 1 tablet (40 mg total) by mouth every evening. 90 tablet 3  . Probiotic Product (PROBIOTIC DAILY PO) Take 1 capsule by mouth daily.     No current facility-administered medications on file prior to visit.    Past Medical History:  Diagnosis Date  . Allergy   . Anemia   . Anxiety   . Arthritis   . Carpal tunnel syndrome   . Constipation    senna C stool softeners help   . Depression   . Diverticulosis   . Dyslipidemia   .  External hemorrhoids   . GERD (gastroesophageal reflux disease)   . Heart murmur    mild-moderate AR  . Hiatal hernia   . Hyperlipidemia    on meds   . Hypertension   . Internal hemorrhoids   . Osteoarthritis   . Pre-diabetes   . PVD (peripheral vascular disease) ([facility])    moderate carotid disease  . RBBB   . Smoker   . Tubular adenoma of colon   . Vocal cord polyps     Past Surgical History:  Procedure Laterality Date  . ABDOMINAL HYSTERECTOMY  1994  . COLONOSCOPY    . COLONOSCOPY W/ POLYPECTOMY  2009  . HEMORRHOID SURGERY    .  JOINT REPLACEMENT    . POLYPECTOMY    . right total knee arthroplasty     Dr. Emeterio Reeve 06-04-18  . SPINE SURGERY  08/16/2009  . TOTAL KNEE ARTHROPLASTY Left 02/13/2014   Procedure: TOTAL KNEE ARTHROPLASTY;  Surgeon: Alta Corning, MD;  Location: [place];  Service: Orthopedics;  Laterality: Left;  . TOTAL KNEE ARTHROPLASTY Right 06/04/2018   Procedure: RIGHT TOTAL KNEE ARTHROPLASTY;  Surgeon: Dorna Leitz, MD;  Location: [facility];  Service: Orthopedics;  Laterality: Right;  Adductor Block  . TUBAL LIGATION      Social History   Socioeconomic History  . Marital status: Married    Spouse name: Not on file  . Number of children: 2  . Years of education: Not on file  . Highest education level: Not on file  Occupational History  . Not on file  Tobacco Use  . Smoking status: Light Tobacco Smoker    Packs/day: 0.25    Years: 40.00    Pack years: 10.00    Types: Cigarettes  . Smokeless tobacco: Never Used  . Tobacco comment: PACK WILL LAST 3 days  Substance and Sexual Activity  . Alcohol use: No  . Drug use: No  . Sexual activity: Not Currently  Other Topics Concern  . Not on file  Social History Narrative  . Not on file   Social Determinants of Health   Financial Resource Strain:   . Difficulty of Paying Living Expenses:   Food Insecurity:   . Worried About Charity fundraiser in the Last Year:   . Arboriculturist in the Last Year:   Transportation Needs:   . Film/video editor (Medical):   Marland Kitchen Lack of Transportation (Non-Medical):   Physical Activity:   . Days of Exercise per Week:   . Minutes of Exercise per Session:   Stress:   . Feeling of Stress :   Social Connections:   . Frequency of Communication with Friends and Family:   . Frequency of Social Gatherings with Friends and Family:   . Attends Religious Services:   . Active Member of Clubs or Organizations:   . Attends Archivist Meetings:   Marland Kitchen Marital Status:     Family History  Problem Relation Age of  Onset  . Cancer Father   . Prostate cancer Father   . Diabetes Sister   . Stroke Maternal Grandfather   . Colitis Maternal Aunt   . Colon cancer Neg Hx   . Colon polyps Neg Hx   . Rectal cancer Neg Hx   . Stomach cancer Neg Hx   . Esophageal cancer Neg Hx     Review of Systems  Constitutional: Negative for chills and fever.  Respiratory: Positive for wheezing (occ). Negative for cough and  shortness of breath.   Cardiovascular: Negative for chest pain, palpitations and leg swelling.  Gastrointestinal: Negative for abdominal pain and nausea.  Genitourinary: Positive for dysuria. Negative for frequency, hematuria and urgency.  Musculoskeletal: Positive for arthralgias. Negative for back pain.       Right clavicle soreness intermittently  Neurological: Negative for light-headedness and headaches (occ, sharp pain lasts seconds).       Objective:   Vitals:   09/12/19 0912  BP: (!) 144/68  Pulse: (!) 58  Resp: 16  Temp: 98.6 F (37 C)  SpO2: 99%   BP Readings from Last 3 Encounters:  09/12/19 (!) 144/68  05/31/19 110/60  05/20/19 (!) 132/49   Wt Readings from Last 3 Encounters:  09/12/19 123 lb (55.8 kg)  05/31/19 123 lb (55.8 kg)  05/20/19 121 lb (54.9 kg)   Body mass index is 22.86 kg/m.   Physical Exam    Constitutional: Appears well-developed and well-nourished. No distress.  HENT:  Head: Normocephalic and atraumatic.  Neck: Neck supple. No tracheal deviation present. No thyromegaly present.  No cervical lymphadenopathy Cardiovascular: Normal rate, regular rhythm and normal heart sounds.   No murmur heard. Right carotid bruit .  No edema Pulmonary/Chest: right clavicle at sternum larger.  Effort normal and breath sounds normal. No respiratory distress. No has no wheezes. No rales. Abdomen: Soft, nontender, nondistended Skin: Skin is warm and dry. Not diaphoretic.  Psychiatric: Normal mood and affect. Behavior is normal.      Assessment & Plan:    See  Problem List for Assessment and Plan of chronic medical problems.    This visit occurred during the SARS-CoV-2 public health emergency.  Safety protocols were in place, including screening questions prior to the visit, additional usage of staff PPE, and extensive cleaning of exam room while observing appropriate contact time as indicated for disinfecting solutions.

## 2019-09-11 NOTE — Patient Instructions (Addendum)
  Blood work was ordered.   Xray ordered.     Medications reviewed and updated.  Changes include :   Start Keflex for your UTI.  Your prescription(s) have been submitted to your pharmacy. Please take as directed and contact our office if you believe you are having problem(s) with the medication(s).    Please followup in 6 months

## 2019-09-12 ENCOUNTER — Ambulatory Visit (INDEPENDENT_AMBULATORY_CARE_PROVIDER_SITE_OTHER): Payer: Medicare HMO | Admitting: Internal Medicine

## 2019-09-12 ENCOUNTER — Encounter: Payer: Self-pay | Admitting: Internal Medicine

## 2019-09-12 ENCOUNTER — Other Ambulatory Visit: Payer: Self-pay

## 2019-09-12 ENCOUNTER — Ambulatory Visit (INDEPENDENT_AMBULATORY_CARE_PROVIDER_SITE_OTHER): Payer: Medicare HMO

## 2019-09-12 VITALS — BP 144/68 | HR 58 | Temp 98.6°F | Resp 16 | Ht 61.5 in | Wt 123.0 lb

## 2019-09-12 DIAGNOSIS — J381 Polyp of vocal cord and larynx: Secondary | ICD-10-CM | POA: Diagnosis not present

## 2019-09-12 DIAGNOSIS — M898X1 Other specified disorders of bone, shoulder: Secondary | ICD-10-CM

## 2019-09-12 DIAGNOSIS — K5909 Other constipation: Secondary | ICD-10-CM | POA: Diagnosis not present

## 2019-09-12 DIAGNOSIS — R300 Dysuria: Secondary | ICD-10-CM

## 2019-09-12 DIAGNOSIS — I10 Essential (primary) hypertension: Secondary | ICD-10-CM

## 2019-09-12 DIAGNOSIS — Z720 Tobacco use: Secondary | ICD-10-CM | POA: Diagnosis not present

## 2019-09-12 DIAGNOSIS — F3289 Other specified depressive episodes: Secondary | ICD-10-CM

## 2019-09-12 DIAGNOSIS — K219 Gastro-esophageal reflux disease without esophagitis: Secondary | ICD-10-CM

## 2019-09-12 DIAGNOSIS — R490 Dysphonia: Secondary | ICD-10-CM | POA: Diagnosis not present

## 2019-09-12 DIAGNOSIS — E782 Mixed hyperlipidemia: Secondary | ICD-10-CM | POA: Diagnosis not present

## 2019-09-12 DIAGNOSIS — E119 Type 2 diabetes mellitus without complications: Secondary | ICD-10-CM | POA: Diagnosis not present

## 2019-09-12 DIAGNOSIS — M19011 Primary osteoarthritis, right shoulder: Secondary | ICD-10-CM | POA: Diagnosis not present

## 2019-09-12 DIAGNOSIS — F419 Anxiety disorder, unspecified: Secondary | ICD-10-CM

## 2019-09-12 LAB — POCT URINALYSIS DIPSTICK
Bilirubin, UA: NEGATIVE
Glucose, UA: NEGATIVE
Ketones, UA: NEGATIVE
Nitrite, UA: NEGATIVE
Protein, UA: NEGATIVE
Spec Grav, UA: >=1.03 — AB (ref 1.010–1.025)
Urobilinogen, UA: NEGATIVE E.U./dL — AB
pH, UA: 6 (ref 5.0–8.0)

## 2019-09-12 LAB — LIPID PANEL
Cholesterol: 142 mg/dL (ref 0–200)
HDL: 44.6 mg/dL (ref >39.00)
LDL Cholesterol: 63 mg/dL (ref 0–99)
NonHDL: 97.43
Total CHOL/HDL Ratio: 3
Triglycerides: 174 mg/dL — ABNORMAL HIGH (ref 0.0–149.0)
VLDL: 34.8 mg/dL (ref 0.0–40.0)

## 2019-09-12 LAB — COMPREHENSIVE METABOLIC PANEL
ALT: 15 U/L (ref 0–35)
AST: 23 U/L (ref 0–37)
Albumin: 4.4 g/dL (ref 3.5–5.2)
Alkaline Phosphatase: 43 U/L (ref 39–117)
BUN: 16 mg/dL (ref 6–23)
CO2: 32 mEq/L (ref 19–32)
Calcium: 10.2 mg/dL (ref 8.4–10.5)
Chloride: 102 mEq/L (ref 96–112)
Creatinine, Ser: 0.81 mg/dL (ref 0.40–1.20)
GFR: 84.32 mL/min (ref >60.00)
Glucose, Bld: 96 mg/dL (ref 70–99)
Potassium: 3.7 mEq/L (ref 3.5–5.1)
Sodium: 140 mEq/L (ref 135–145)
Total Bilirubin: 0.4 mg/dL (ref 0.2–1.2)
Total Protein: 7.4 g/dL (ref 6.0–8.3)

## 2019-09-12 LAB — HEMOGLOBIN A1C: Hgb A1c MFr Bld: 6.3 % (ref 4.6–6.5)

## 2019-09-12 MED ORDER — SERTRALINE HCL 25 MG PO TABS: 25.0000 mg | ORAL_TABLET | Freq: Every day | ORAL | 1 refills | Status: DC

## 2019-09-12 MED ORDER — ALPRAZOLAM 0.5 MG PO TABS: 0.5000 mg | ORAL_TABLET | Freq: Two times a day (BID) | ORAL | 5 refills | Status: DC | PRN

## 2019-09-12 MED ORDER — CEPHALEXIN 500 MG PO CAPS: 500.0000 mg | ORAL_CAPSULE | Freq: Two times a day (BID) | ORAL | 0 refills | Status: DC

## 2019-09-12 NOTE — Assessment & Plan Note (Addendum)
Chronic GERD controlled - has bloating every time she eats Denies GERD - will try to slowly taper off the prilosec to see if she needs it or not

## 2019-09-12 NOTE — Assessment & Plan Note (Signed)
Chronic Controlled, stable Continue current dose of medication - zoloft 25 mg nightly

## 2019-09-12 NOTE — Assessment & Plan Note (Signed)
Chronic Check lipid panel  Continue daily statin Regular exercise and healthy diet encouraged  

## 2019-09-12 NOTE — Assessment & Plan Note (Signed)
Acute Urine dip suggestive of UTI Will start Keflex twice daily x7 days Send urine for culture

## 2019-09-12 NOTE — Assessment & Plan Note (Signed)
Right clavicle at sternal joint is much larger than her left There is some intermittent pain Most likely this is arthritic in nature, but given prominence on appearance will check x-ray

## 2019-09-12 NOTE — Assessment & Plan Note (Signed)
Chronic Diagnosed 03/2019 Diet controlled Check a1c Low sugar / carb diet Stressed regular exercise

## 2019-09-12 NOTE — Assessment & Plan Note (Signed)
Controlled Takes 8 stool softeners daily Drinks lots of water

## 2019-09-12 NOTE — Assessment & Plan Note (Addendum)
Chronic Controlled, stable Continue current dose of medication - sertraline 25 mg nightly, xanax prn Discussed possible long term consequences of taking xanax

## 2019-09-12 NOTE — Assessment & Plan Note (Signed)
Advised smoking cessation

## 2019-09-12 NOTE — Assessment & Plan Note (Signed)
BP Readings from Last 3 Encounters:  09/12/19 (!) 144/68  05/31/19 110/60  05/20/19 (!) 132/49   Chronic BP well controlled Current regimen effective and well tolerated Continue current medications at current doses cmp

## 2019-09-15 ENCOUNTER — Telehealth: Payer: Self-pay

## 2019-09-15 NOTE — Telephone Encounter (Signed)
Pt aware of results 

## 2019-09-15 NOTE — Telephone Encounter (Signed)
New message    The patient returning a call to the nurse from yesterday.

## 2019-09-20 ENCOUNTER — Ambulatory Visit: Payer: Medicare HMO | Attending: Family

## 2019-09-20 DIAGNOSIS — Z23 Encounter for immunization: Secondary | ICD-10-CM

## 2019-09-20 NOTE — Progress Notes (Signed)
   Covid-19 Vaccination Clinic  Name:  Hannah [name]    MRN: [id number] DOB: 03/12/1951  09/20/2019  [name] was observed post Covid-19 immunization for 15 minutes without incident. She was provided with Vaccine Information Sheet and instruction to access the V-Safe system.   [name] was instructed to call 911 with any severe reactions post vaccine: Marland Kitchen Difficulty breathing  . Swelling of face and throat  . A fast heartbeat  . A bad rash all over body  . Dizziness and weakness   Immunizations Administered    Name Date Dose VIS Date Route   Moderna COVID-19 Vaccine 09/20/2019 11:01 AM 0.5 mL 05/31/2019 Intramuscular   Manufacturer: Moderna   Lot: [id number]   [place]: 95188-416-60

## 2019-10-14 ENCOUNTER — Other Ambulatory Visit: Payer: Self-pay

## 2019-10-14 MED ORDER — PRAVASTATIN SODIUM 40 MG PO TABS: 40.0000 mg | ORAL_TABLET | Freq: Every evening | ORAL | 1 refills | Status: DC

## 2019-10-16 MED ORDER — BISOPROLOL-HYDROCHLOROTHIAZIDE 5-6.25 MG PO TABS: 1.0000 | ORAL_TABLET | Freq: Every day | ORAL | 0 refills | Status: DC

## 2019-10-16 MED ORDER — OMEPRAZOLE 40 MG PO CPDR: DELAYED_RELEASE_CAPSULE | ORAL | 0 refills | Status: DC

## 2019-11-14 ENCOUNTER — Telehealth (INDEPENDENT_AMBULATORY_CARE_PROVIDER_SITE_OTHER): Payer: Medicare HMO | Admitting: Family

## 2019-11-14 DIAGNOSIS — J019 Acute sinusitis, unspecified: Secondary | ICD-10-CM

## 2019-11-14 MED ORDER — FLUTICASONE PROPIONATE 50 MCG/ACT NA SUSP
2.0000 | Freq: Every day | NASAL | 6 refills | Status: DC
Start: 2019-11-14 — End: 2020-03-14

## 2019-11-14 MED ORDER — AMOXICILLIN-POT CLAVULANATE 875-125 MG PO TABS: 1.0000 | ORAL_TABLET | Freq: Two times a day (BID) | ORAL | 0 refills | Status: AC

## 2019-11-14 NOTE — Progress Notes (Signed)
Hannah [name] is a 69 y.o. female with the following history as recorded in EpicCare:  Patient Active Problem List   Diagnosis Date Noted  . Dysuria 09/12/2019  . Pain of right clavicle 09/12/2019  . Nonspecific abnormal electrocardiogram (ECG) (EKG) 10/20/2018  . Educated about COVID-19 virus infection 10/20/2018  . Depression 07/17/2018  . Primary osteoarthritis of right knee 06/04/2018  . Bilateral carotid artery stenosis 09/24/2017  . Aortic valve sclerosis 09/24/2017  . Vocal cord polyps 09/07/2017  . Dysphagia 09/07/2017  . Cervical strain 07/24/2017  . Diabetes mellitus without complication ([place]) 45/40/9811  . Chest pain 12/29/2016  . GERD (gastroesophageal reflux disease) 02/27/2016  . Anxiety 02/27/2016  . Osteoarthritis of left knee 02/19/2014  . S/P total knee replacement 02/13/2014  . RBBB 02/08/2014  . HTN (hypertension) 02/08/2014  . PVD - bilateral 60-79% carotid strenosis 02/08/2014  . Mixed hyperlipidemia 11/29/2013  . Carpal tunnel syndrome, bilateral 11/23/2013  . DJD (degenerative joint disease) 11/23/2013  . Murmur- mild -mod AR, mild MR 11/10/2013  . Chronic constipation 12/16/2012  . Arthritis 10/08/2012  . Nicotine abuse 12/29/2011    Current Outpatient Medications  Medication Sig Dispense Refill  . ALPRAZolam (XANAX) 0.5 MG tablet Take 1 tablet (0.5 mg total) by mouth 2 (two) times daily as needed. for sleep 60 tablet 5  . amoxicillin-clavulanate (AUGMENTIN) 875-125 MG tablet Take 1 tablet by mouth 2 (two) times daily for 10 days. 20 tablet 0  . Ascorbic Acid (VITAMIN C PO) Take 1,000 mg by mouth daily.     Marland Kitchen aspirin 81 MG tablet Take 1 tablet (81 mg total) by mouth daily. 30 tablet   . bisoprolol-hydrochlorothiazide (ZIAC) 5-6.25 MG tablet Take 1 tablet by mouth daily. 90 tablet 0  . Calcium Carbonate (CALCIUM 600 PO) Take 1,200 mg by mouth every other day.     . cholecalciferol (VITAMIN D) 1000 UNITS tablet Take 1 tablet (1,000 Units total) by  mouth daily. 30 tablet 5  . fenofibrate 160 MG tablet TAKE 1 TABLET EVERY DAY 90 tablet 1  . fluticasone (FLONASE) 50 MCG/ACT nasal spray Place 2 sprays into both nostrils daily. 16 g 6  . Glycerin-Hypromellose-PEG 400 ([facility] DRY EYE RELIEF) 0.2-0.2-1 % SOLN Place 1 drop into both eyes 2 (two) times a day.    . naphazoline-glycerin (CLEAR EYES REDNESS) 0.012-0.2 % SOLN Place 1-2 drops into both eyes 4 (four) times daily as needed for eye irritation. bausch and lomb    . omeprazole (PRILOSEC) 40 MG capsule TAKE 1 CAPSULE TWICE DAILY BEFORE MEALS 180 capsule 0  . pravastatin (PRAVACHOL) 40 MG tablet Take 1 tablet (40 mg total) by mouth every evening. 90 tablet 1  . Probiotic Product (PROBIOTIC DAILY PO) Take 1 capsule by mouth daily.    . sertraline (ZOLOFT) 25 MG tablet Take 1 tablet (25 mg total) by mouth at bedtime. 90 tablet 1   No current facility-administered medications for this visit.    Allergies: Amlodipine, Chantix [varenicline tartrate], Clarithromycin, Lisinopril, Simvastatin, Wellbutrin [bupropion hcl], and Lipitor [atorvastatin]  Past Medical History:  Diagnosis Date  . Allergy   . Anemia   . Anxiety   . Arthritis   . Carpal tunnel syndrome   . Constipation    senna C stool softeners help   . Depression   . Diverticulosis   . Dyslipidemia   . External hemorrhoids   . GERD (gastroesophageal reflux disease)   . Heart murmur    mild-moderate AR  . Hiatal hernia   .  Hyperlipidemia    on meds   . Hypertension   . Internal hemorrhoids   . Osteoarthritis   . Pre-diabetes   . PVD (peripheral vascular disease) ([facility])    moderate carotid disease  . RBBB   . Smoker   . Tubular adenoma of colon   . Vocal cord polyps     Past Surgical History:  Procedure Laterality Date  . ABDOMINAL HYSTERECTOMY  1994  . COLONOSCOPY    . COLONOSCOPY W/ POLYPECTOMY  2009  . HEMORRHOID SURGERY    . JOINT REPLACEMENT    . POLYPECTOMY    . right total knee arthroplasty     Dr. Emeterio Reeve  06-04-18  . SPINE SURGERY  08/16/2009  . TOTAL KNEE ARTHROPLASTY Left 02/13/2014   Procedure: TOTAL KNEE ARTHROPLASTY;  Surgeon: Alta Corning, MD;  Location: [place];  Service: Orthopedics;  Laterality: Left;  . TOTAL KNEE ARTHROPLASTY Right 06/04/2018   Procedure: RIGHT TOTAL KNEE ARTHROPLASTY;  Surgeon: Dorna Leitz, MD;  Location: [facility];  Service: Orthopedics;  Laterality: Right;  Adductor Block  . TUBAL LIGATION      Family History  Problem Relation Age of Onset  . Cancer Father   . Prostate cancer Father   . Diabetes Sister   . Stroke Maternal Grandfather   . Colitis Maternal Aunt   . Colon cancer Neg Hx   . Colon polyps Neg Hx   . Rectal cancer Neg Hx   . Stomach cancer Neg Hx   . Esophageal cancer Neg Hx     Social History   Tobacco Use  . Smoking status: Light Tobacco Smoker    Packs/day: 0.25    Years: 40.00    Pack years: 10.00    Types: Cigarettes  . Smokeless tobacco: Never Used  . Tobacco comment: PACK WILL LAST 3 days  Substance Use Topics  . Alcohol use: No    Subjective:   I connected with Hannah [name] on 11/14/19 at  3:20 PM EDT by a telephone call and verified that I am speaking with the correct person using two identifiers.   I discussed the limitations of evaluation and management by telemedicine and the availability of in person appointments. The patient expressed understanding and agreed to proceed. Provider in office/ patient is at home; provider and patient are only 2 people on telephone call.   1 week history of sinus pain/ pressure; "bad taste in my mouth." Using saline rinse with limited benefit; feels drainage in back of throat; no fever, chest pain or shortness of breath;     Objective:  There were no vitals filed for this visit.  Lungs: Respirations unlabored;  Neurologic: Alert and oriented; speech intact;   Assessment:  1. Acute sinusitis, recurrence not specified, unspecified location     Plan:  Rx for Augmentin 875 mg bid x 10  days, Flonase NS; increase fluids, rest and follow-up worse, no better;  Time spent 8 minutes;   No follow-ups on file.  No orders of the defined types were placed in this encounter.   Requested Prescriptions   Signed Prescriptions Disp Refills  . amoxicillin-clavulanate (AUGMENTIN) 875-125 MG tablet 20 tablet 0    Sig: Take 1 tablet by mouth 2 (two) times daily for 10 days.  . fluticasone (FLONASE) 50 MCG/ACT nasal spray 16 g 6    Sig: Place 2 sprays into both nostrils daily.

## 2019-12-22 LAB — HOUSE ACCOUNT TRACKING

## 2019-12-22 LAB — URINE CULTURE
MICRO NUMBER:: 10251578
SPECIMEN QUALITY:: ADEQUATE

## 2020-01-03 ENCOUNTER — Ambulatory Visit: Payer: 59

## 2020-01-10 ENCOUNTER — Ambulatory Visit: Payer: 59

## 2020-01-11 ENCOUNTER — Ambulatory Visit (INDEPENDENT_AMBULATORY_CARE_PROVIDER_SITE_OTHER): Payer: Medicare HMO

## 2020-01-11 DIAGNOSIS — Z0000 Encounter for general adult medical examination without abnormal findings: Secondary | ICD-10-CM

## 2020-01-11 NOTE — Progress Notes (Signed)
I connected with Hannah [name] today by telephone and verified that I am speaking with the correct person using two identifiers. Location patient: home Location provider: work Persons participating in the virtual visit: Maryama Kuriakose and Lisette Abu, LPN   I discussed the limitations, risks, security and privacy concerns of performing an evaluation and management service by telephone and the availability of in person appointments. I also discussed with the patient that there may be a patient responsible charge related to this service. The patient expressed understanding and verbally consented to this telephonic visit.    Interactive audio and video telecommunications were attempted between this provider and patient, however failed, due to patient having technical difficulties OR patient did not have access to video capability.  We continued and completed visit with audio only.  Some vital signs may be absent or patient reported.   Time Spent with patient on telephone encounter: 30 minutes  Subjective:   Hannah [name] is a 69 y.o. female who presents for Medicare Annual (Subsequent) preventive examination.  Review of Systems    No ROS. Medicare Wellness Visit Cardiac Risk Factors include: advanced age (>43men, >60 women);diabetes mellitus;dyslipidemia;family history of premature cardiovascular disease;hypertension;smoking/ tobacco exposure     Objective:    There were no vitals filed for this visit. There is no height or weight on file to calculate BMI.  Advanced Directives 01/11/2020 01/10/2019 12/23/2018 06/04/2018 05/18/2018 12/21/2017 12/23/2016  Does Patient Have a Medical Advance Directive? No No No No No No No  Does patient want to make changes to medical advance directive? - No - Patient declined Yes (ED - Information included in AVS) - - Yes (ED - Information included in AVS) -  Would patient like information on creating a medical advance directive? No - Patient declined No  - Patient declined - Yes (MAU/Ambulatory/Procedural Areas - Information given) Yes (MAU/Ambulatory/Procedural Areas - Information given) - -    Current Medications (verified) Outpatient Encounter Medications as of 01/11/2020  Medication Sig  . ALPRAZolam (XANAX) 0.5 MG tablet Take 1 tablet (0.5 mg total) by mouth 2 (two) times daily as needed. for sleep  . Ascorbic Acid (VITAMIN C PO) Take 1,000 mg by mouth daily.   Marland Kitchen aspirin 81 MG tablet Take 1 tablet (81 mg total) by mouth daily.  . bisoprolol-hydrochlorothiazide (ZIAC) 5-6.25 MG tablet Take 1 tablet by mouth daily.  . Calcium Carbonate (CALCIUM 600 PO) Take 1,200 mg by mouth every other day.   . cholecalciferol (VITAMIN D) 1000 UNITS tablet Take 1 tablet (1,000 Units total) by mouth daily.  . fenofibrate 160 MG tablet TAKE 1 TABLET EVERY DAY  . fluticasone (FLONASE) 50 MCG/ACT nasal spray Place 2 sprays into both nostrils daily.  . Glycerin-Hypromellose-PEG 400 ([facility] DRY EYE RELIEF) 0.2-0.2-1 % SOLN Place 1 drop into both eyes 2 (two) times a day.  . naphazoline-glycerin (CLEAR EYES REDNESS) 0.012-0.2 % SOLN Place 1-2 drops into both eyes 4 (four) times daily as needed for eye irritation. bausch and lomb  . omeprazole (PRILOSEC) 40 MG capsule TAKE 1 CAPSULE TWICE DAILY BEFORE MEALS  . pravastatin (PRAVACHOL) 40 MG tablet Take 1 tablet (40 mg total) by mouth every evening.  . Probiotic Product (PROBIOTIC DAILY PO) Take 1 capsule by mouth daily.  . sertraline (ZOLOFT) 25 MG tablet Take 1 tablet (25 mg total) by mouth at bedtime.   No facility-administered encounter medications on file as of 01/11/2020.    Allergies (verified) Amlodipine, Chantix [varenicline tartrate], Clarithromycin, Lisinopril, Simvastatin, Wellbutrin [  bupropion hcl], and Lipitor [atorvastatin]   History: Past Medical History:  Diagnosis Date  . Allergy   . Anemia   . Anxiety   . Arthritis   . Carpal tunnel syndrome   . Constipation    senna C stool softeners help    . Depression   . Diverticulosis   . Dyslipidemia   . External hemorrhoids   . GERD (gastroesophageal reflux disease)   . Heart murmur    mild-moderate AR  . Hiatal hernia   . Hyperlipidemia    on meds   . Hypertension   . Internal hemorrhoids   . Osteoarthritis   . Pre-diabetes   . PVD (peripheral vascular disease) (HCC)    moderate carotid disease  . RBBB   . Smoker   . Tubular adenoma of colon   . Vocal cord polyps    Past Surgical History:  Procedure Laterality Date  . ABDOMINAL HYSTERECTOMY  1994  . COLONOSCOPY    . COLONOSCOPY W/ POLYPECTOMY  2009  . HEMORRHOID SURGERY    . JOINT REPLACEMENT    . POLYPECTOMY    . right total knee arthroplasty     Dr. Emeterio Reeve 06-04-18  . SPINE SURGERY  08/16/2009  . TOTAL KNEE ARTHROPLASTY Left 02/13/2014   Procedure: TOTAL KNEE ARTHROPLASTY;  Surgeon: Alta Corning, MD;  Location: [facility];  Service: Orthopedics;  Laterality: Left;  . TOTAL KNEE ARTHROPLASTY Right 06/04/2018   Procedure: RIGHT TOTAL KNEE ARTHROPLASTY;  Surgeon: Dorna Leitz, MD;  Location: [facility];  Service: Orthopedics;  Laterality: Right;  Adductor Block  . TUBAL LIGATION     Family History  Problem Relation Age of Onset  . Cancer Father   . Prostate cancer Father   . Diabetes Sister   . Stroke Maternal Grandfather   . Colitis Maternal Aunt   . Colon cancer Neg Hx   . Colon polyps Neg Hx   . Rectal cancer Neg Hx   . Stomach cancer Neg Hx   . Esophageal cancer Neg Hx    Social History   Socioeconomic History  . Marital status: Married    Spouse name: Not on file  . Number of children: 2  . Years of education: Not on file  . Highest education level: Not on file  Occupational History  . Not on file  Tobacco Use  . Smoking status: Light Tobacco Smoker    Packs/day: 0.25    Years: 40.00    Pack years: 10.00    Types: Cigarettes  . Smokeless tobacco: Never Used  . Tobacco comment: PACK WILL LAST 3 days  Vaping Use  . Vaping Use: Never used  Substance  and Sexual Activity  . Alcohol use: No  . Drug use: No  . Sexual activity: Not Currently  Other Topics Concern  . Not on file  Social History Narrative  . Not on file   Social Determinants of Health   Financial Resource Strain: Low Risk   . Difficulty of Paying Living Expenses: Not hard at all  Food Insecurity: No Food Insecurity  . Worried About Charity fundraiser in the Last Year: Never true  . Ran Out of Food in the Last Year: Never true  Transportation Needs: No Transportation Needs  . Lack of Transportation (Medical): No  . Lack of Transportation (Non-Medical): No  Physical Activity: Sufficiently Active  . Days of Exercise per Week: 4 days  . Minutes of Exercise per Session: 50 min  Stress: No Stress  Concern Present  . Feeling of Stress : Not at all  Social Connections: Socially Integrated  . Frequency of Communication with Friends and Family: More than three times a week  . Frequency of Social Gatherings with Friends and Family: More than three times a week  . Attends Religious Services: More than 4 times per year  . Active Member of Clubs or Organizations: Yes  . Attends Archivist Meetings: More than 4 times per year  . Marital Status: Married    Tobacco Counseling Ready to quit: Not Answered Counseling given: Not Answered Comment: PACK WILL LAST 3 days   Clinical Intake:  Pre-visit preparation completed: Yes  Pain : No/denies pain     Nutritional Risks: None Diabetes: Yes CBG done?: No Did pt. bring in CBG monitor from home?: No  How often do you need to have someone help you when you read instructions, pamphlets, or other written materials from your doctor or pharmacy?: 1 - Never What is the last grade level you completed in school?: HSG  Diabetic? yes  Interpreter Needed?: No  Information entered by :: Clebert Wenger N. Laval Cafaro, LPN   Activities of Daily Living In your present state of health, do you have any difficulty performing the  following activities: 01/11/2020  Hearing? N  Vision? N  Difficulty concentrating or making decisions? N  Walking or climbing stairs? N  Dressing or bathing? N  Doing errands, shopping? N  Preparing Food and eating ? N  Using the Toilet? N  In the past six months, have you accidently leaked urine? N  Do you have problems with loss of bowel control? N  Managing your Medications? N  Managing your Finances? N  Housekeeping or managing your Housekeeping? N  Some recent data might be hidden    Patient Care Team: [name], MD as PCP - General (Internal Medicine) Minus Breeding, MD as PCP - Cardiology (Cardiology)  Indicate any recent Medical Services you may have received from other than [facility] providers in the past year (date may be approximate).     Assessment:   This is a routine wellness examination for Hannah [name].  Hearing/Vision screen No exam data present  Dietary issues and exercise activities discussed: Current Exercise Habits: Home exercise routine, Type of exercise: walking, Time (Minutes): 50, Frequency (Times/Week): 4, Weekly Exercise (Minutes/Week): 200, Intensity: Moderate, Exercise limited by: None identified  Goals    . LDL CALC < 100     LDL goal < 100 preferably.  Non-HDL goal < 130    . Patient Stated     I want to eat healthy to control cholesterol and prevent diabetes.      Depression Screen PHQ 2/9 Scores 01/11/2020 09/12/2019 12/23/2018 11/19/2018 12/21/2017 03/09/2017 01/05/2016  PHQ - 2 Score 0 2 1 0 0 2 0  PHQ- 9 Score - 4 - 0 0 - -    Fall Risk Fall Risk  01/11/2020 12/23/2018 11/19/2018 12/21/2017 03/09/2017  Falls in the past year? 0 0 0 No No  Comment - - - - -  Number falls in past yr: 0 0 0 - -  Injury with Fall? 0 - - - -  Risk for fall due to : No Fall Risks - - - -  Follow up Falls evaluation completed - - - -    Any stairs in or around the home? No  If so, are there any without handrails? No  Home free of loose throw rugs in walkways, pet  beds, electrical cords, etc? Yes  Adequate lighting in your home to reduce risk of falls? Yes   ASSISTIVE DEVICES UTILIZED TO PREVENT FALLS:  Life alert? No  Use of a cane, walker or w/c? No  Grab bars in the bathroom? No  Shower chair or bench in shower? No  Elevated toilet seat or a handicapped toilet? No   TIMED UP AND GO:  Was the test performed? No .  Length of time to ambulate 10 feet: 0 sec.   Gait steady and fast without use of assistive device  Cognitive Function: MMSE - Mini Mental State Exam 12/21/2017  Orientation to time 5  Orientation to Place 5  Registration 3  Attention/ Calculation 5  Recall 3  Language- name 2 objects 2  Language- repeat 1  Language- follow 3 step command 3  Language- read & follow direction 1  Write a sentence 1  Copy design 1  Total score 30        Immunizations Immunization History  Administered Date(s) Administered  . Fluad Quad(high Dose 65+) 03/14/2019  . Moderna SARS-COVID-2 Vaccination 08/22/2019, 09/20/2019  . Pneumococcal Conjugate-13 05/30/2014  . Pneumococcal Polysaccharide-23 02/27/2016  . Td 08/01/2004  . Tdap 05/30/2014    TDAP status: Up to date Flu Vaccine status: Up to date Pneumococcal vaccine status: Up to date Covid-19 vaccine status: Completed vaccines  Qualifies for Shingles Vaccine? Yes   Zostavax completed No   Shingrix Completed?: No.    Education has been provided regarding the importance of this vaccine. Patient has been advised to call insurance company to determine out of pocket expense if they have not yet received this vaccine. Advised may also receive vaccine at local pharmacy or Health Dept. Verbalized acceptance and understanding.  Screening Tests Health Maintenance  Topic Date Due  . FOOT EXAM  Never done  . OPHTHALMOLOGY EXAM  Never done  . INFLUENZA VACCINE  01/29/2020  . HEMOGLOBIN A1C  03/14/2020  . MAMMOGRAM  04/27/2020  . DEXA SCAN  09/02/2021  . COLONOSCOPY  05/19/2024  .  TETANUS/TDAP  05/30/2024  . COVID-19 Vaccine  Completed  . Hepatitis C Screening  Completed  . PNA vac Low Risk Adult  Completed    Health Maintenance  Health Maintenance Due  Topic Date Due  . FOOT EXAM  Never done  . OPHTHALMOLOGY EXAM  Never done    Colorectal cancer screening: Completed 05/20/2019. Repeat every 5 years Mammogram status: Completed 04/25/2019. Repeat every year Bone Density status: Completed 09/02/2016. Results reflect: Bone density results: NORMAL. Repeat every 5 years.  Lung Cancer Screening: (Low Dose CT Chest recommended if Age 44-80 years, 30 pack-year currently smoking OR have quit w/in 15years.) does qualify.   Lung Cancer Screening Referral: no  Additional Screening:  Hepatitis C Screening: does qualify; Completed yes  Vision Screening: Recommended annual ophthalmology exams for early detection of glaucoma and other disorders of the eye. Is the patient up to date with their annual eye exam?  No  Who is the provider or what is the name of the office in which the patient attends annual eye exams? [facility] If pt is not established with a provider, would they like to be referred to a provider to establish care? No .   Dental Screening: Recommended annual dental exams for proper oral hygiene  Community Resource Referral / Chronic Care Management: CRR required this visit?  No   CCM required this visit?  No      Plan:  I have personally reviewed and noted the following in the patient's chart:   . Medical and social history . Use of alcohol, tobacco or illicit drugs  . Current medications and supplements . Functional ability and status . Nutritional status . Physical activity . Advanced directives . List of other physicians . Hospitalizations, surgeries, and ER visits in previous 12 months . Vitals . Screenings to include cognitive, depression, and falls . Referrals and appointments  In addition, I have reviewed and discussed with  patient certain preventive protocols, quality metrics, and best practice recommendations. A written personalized care plan for preventive services as well as general preventive health recommendations were provided to patient.     Sheral Flow, LPN   8/59/0931   Nurse Notes:  Patient is cogitatively intact. There were no vitals filed for this visit. There is no height or weight on file to calculate BMI. Patient stated that she has no issues with gait or balance; does not use any assistive devices. Patient stated that she will check with local pharmacy for Shingrix vaccine (CDC printout mailed)

## 2020-01-11 NOTE — Patient Instructions (Signed)
Hannah [name] , Thank you for taking time to come for your Medicare Wellness Visit. I appreciate your ongoing commitment to your health goals. Please review the following plan we discussed and let me know if I can assist you in the future.   Screening recommendations/referrals: Colonoscopy: 05/20/2019; due every 5 years Mammogram: 04/25/2019; due every 2 years Bone Density: 09/02/2016; due every 5 years Recommended yearly ophthalmology/optometry visit for glaucoma screening and checkup Recommended yearly dental visit for hygiene and checkup  Vaccinations: Influenza vaccine: 03/14/2019 Pneumococcal vaccine: completed Tdap vaccine: 05/30/2014; due every 10 years Shingles vaccine: never done; please check with local pharmacy   Covid-19: completed  Advanced directives: Advance directive discussed with you today. Even though you declined this today please call our office should you change your mind and we can give you the proper paperwork for you to fill out.  Conditions/risks identified: Please continue to do your personal lifestyle choices by: daily care of teeth and gums, regular physical activity (goal should be 5 days a week for 30 minutes), eat a healthy diet, avoid tobacco and drug use, limiting any alcohol intake, taking a low-dose aspirin (if not allergic or have been advised by your provider otherwise) and taking vitamins and minerals as recommended by your provider. Continue doing brain stimulating activities (puzzles, reading, adult coloring books, staying active) to keep memory sharp. Continue to eat heart healthy diet (full of fruits, vegetables, whole grains, lean protein, water--limit salt, fat, and sugar intake) and increase physical activity as tolerated.  Next appointment: Please schedule your next Medicare Wellness Visit with your Nurse Health Advisor in 1 year.   Preventive Care 21 Years and Older, Female Preventive care refers to lifestyle choices and visits with your health care  provider that can promote health and wellness. What does preventive care include?  A yearly physical exam. This is also called an annual well check.  Dental exams once or twice a year.  Routine eye exams. Ask your health care provider how often you should have your eyes checked.  Personal lifestyle choices, including:  Daily care of your teeth and gums.  Regular physical activity.  Eating a healthy diet.  Avoiding tobacco and drug use.  Limiting alcohol use.  Practicing safe sex.  Taking low-dose aspirin every day.  Taking vitamin and mineral supplements as recommended by your health care provider. What happens during an annual well check? The services and screenings done by your health care provider during your annual well check will depend on your age, overall health, lifestyle risk factors, and family history of disease. Counseling  Your health care provider may ask you questions about your:  Alcohol use.  Tobacco use.  Drug use.  Emotional well-being.  Home and relationship well-being.  Sexual activity.  Eating habits.  History of falls.  Memory and ability to understand (cognition).  Work and work Statistician.  Reproductive health. Screening  You may have the following tests or measurements:  Height, weight, and BMI.  Blood pressure.  Lipid and cholesterol levels. These may be checked every 5 years, or more frequently if you are over 54 years old.  Skin check.  Lung cancer screening. You may have this screening every year starting at age 31 if you have a 30-pack-year history of smoking and currently smoke or have quit within the past 15 years.  Fecal occult blood test (FOBT) of the stool. You may have this test every year starting at age 40.  Flexible sigmoidoscopy or colonoscopy. You may have  a sigmoidoscopy every 5 years or a colonoscopy every 10 years starting at age 58.  Hepatitis C blood test.  Hepatitis B blood test.  Sexually  transmitted disease (STD) testing.  Diabetes screening. This is done by checking your blood sugar (glucose) after you have not eaten for a while (fasting). You may have this done every 1-3 years.  Bone density scan. This is done to screen for osteoporosis. You may have this done starting at age 81.  Mammogram. This may be done every 1-2 years. Talk to your health care provider about how often you should have regular mammograms. Talk with your health care provider about your test results, treatment options, and if necessary, the need for more tests. Vaccines  Your health care provider may recommend certain vaccines, such as:  Influenza vaccine. This is recommended every year.  Tetanus, diphtheria, and acellular pertussis (Tdap, Td) vaccine. You may need a Td booster every 10 years.  Zoster vaccine. You may need this after age 85.  Pneumococcal 13-valent conjugate (PCV13) vaccine. One dose is recommended after age 96.  Pneumococcal polysaccharide (PPSV23) vaccine. One dose is recommended after age 74. Talk to your health care provider about which screenings and vaccines you need and how often you need them. This information is not intended to replace advice given to you by your health care provider. Make sure you discuss any questions you have with your health care provider. Document Released: 07/13/2015 Document Revised: 03/05/2016 Document Reviewed: 04/17/2015 Elsevier Interactive Patient Education  2017 [place] Prevention in the Home Falls can cause injuries. They can happen to people of all ages. There are many things you can do to make your home safe and to help prevent falls. What can I do on the outside of my home?  Regularly fix the edges of walkways and driveways and fix any cracks.  Remove anything that might make you trip as you walk through a door, such as a raised step or threshold.  Trim any bushes or trees on the path to your home.  Use bright outdoor  lighting.  Clear any walking paths of anything that might make someone trip, such as rocks or tools.  Regularly check to see if handrails are loose or broken. Make sure that both sides of any steps have handrails.  Any raised decks and porches should have guardrails on the edges.  Have any leaves, snow, or ice cleared regularly.  Use sand or salt on walking paths during winter.  Clean up any spills in your garage right away. This includes oil or grease spills. What can I do in the bathroom?  Use night lights.  Install grab bars by the toilet and in the tub and shower. Do not use towel bars as grab bars.  Use non-skid mats or decals in the tub or shower.  If you need to sit down in the shower, use a plastic, non-slip stool.  Keep the floor dry. Clean up any water that spills on the floor as soon as it happens.  Remove soap buildup in the tub or shower regularly.  Attach bath mats securely with double-sided non-slip rug tape.  Do not have throw rugs and other things on the floor that can make you trip. What can I do in the bedroom?  Use night lights.  Make sure that you have a light by your bed that is easy to reach.  Do not use any sheets or blankets that are too big for your bed.  They should not hang down onto the floor.  Have a firm chair that has side arms. You can use this for support while you get dressed.  Do not have throw rugs and other things on the floor that can make you trip. What can I do in the kitchen?  Clean up any spills right away.  Avoid walking on wet floors.  Keep items that you use a lot in easy-to-reach places.  If you need to reach something above you, use a strong step stool that has a grab bar.  Keep electrical cords out of the way.  Do not use floor polish or wax that makes floors slippery. If you must use wax, use non-skid floor wax.  Do not have throw rugs and other things on the floor that can make you trip. What can I do with my  stairs?  Do not leave any items on the stairs.  Make sure that there are handrails on both sides of the stairs and use them. Fix handrails that are broken or loose. Make sure that handrails are as long as the stairways.  Check any carpeting to make sure that it is firmly attached to the stairs. Fix any carpet that is loose or worn.  Avoid having throw rugs at the top or bottom of the stairs. If you do have throw rugs, attach them to the floor with carpet tape.  Make sure that you have a light switch at the top of the stairs and the bottom of the stairs. If you do not have them, ask someone to add them for you. What else can I do to help prevent falls?  Wear shoes that:  Do not have high heels.  Have rubber bottoms.  Are comfortable and fit you well.  Are closed at the toe. Do not wear sandals.  If you use a stepladder:  Make sure that it is fully opened. Do not climb a closed stepladder.  Make sure that both sides of the stepladder are locked into place.  Ask someone to hold it for you, if possible.  Clearly mark and make sure that you can see:  Any grab bars or handrails.  First and last steps.  Where the edge of each step is.  Use tools that help you move around (mobility aids) if they are needed. These include:  Canes.  Walkers.  Scooters.  Crutches.  Turn on the lights when you go into a dark area. Replace any light bulbs as soon as they burn out.  Set up your furniture so you have a clear path. Avoid moving your furniture around.  If any of your floors are uneven, fix them.  If there are any pets around you, be aware of where they are.  Review your medicines with your doctor. Some medicines can make you feel dizzy. This can increase your chance of falling. Ask your doctor what other things that you can do to help prevent falls. This information is not intended to replace advice given to you by your health care provider. Make sure you discuss any  questions you have with your health care provider. Document Released: 04/12/2009 Document Revised: 11/22/2015 Document Reviewed: 07/21/2014 Elsevier Interactive Patient Education  2017 [employer].

## 2020-01-16 ENCOUNTER — Telehealth: Payer: Self-pay

## 2020-01-16 NOTE — Telephone Encounter (Signed)
Left message for patient today. 

## 2020-01-16 NOTE — Telephone Encounter (Signed)
It should be fine to take either one. She could talk to her pharmacist to get more information if she continues to have a lot of concerns/ questions.

## 2020-01-16 NOTE — Telephone Encounter (Signed)
New message    The patient voiced [facility] has generic Shingles shot the cost is  $ 45.00 the original cost is  $ 95.00 wants to know which would be best for her to take.

## 2020-02-15 ENCOUNTER — Other Ambulatory Visit: Payer: Self-pay | Admitting: Cardiology

## 2020-03-13 NOTE — Progress Notes (Signed)
Subjective:    Patient ID: Hannah [name], female    DOB: [birth year], 69 y.o.   MRN: [id number]  HPI She is here for a physical exam.   She denies any changes since she was here last and has no concerns.  Medications and allergies reviewed with patient and updated if appropriate.  Patient Active Problem List   Diagnosis Date Noted  . Dysuria 09/12/2019  . Pain of right clavicle 09/12/2019  . Nonspecific abnormal electrocardiogram (ECG) (EKG) 10/20/2018  . Educated about COVID-19 virus infection 10/20/2018  . Depression 07/17/2018  . Primary osteoarthritis of right knee 06/04/2018  . Bilateral carotid artery stenosis 09/24/2017  . Aortic valve sclerosis 09/24/2017  . Vocal cord polyps 09/07/2017  . Dysphagia 09/07/2017  . Cervical strain 07/24/2017  . Diabetes mellitus without complication ([place]) 67/59/1638  . Chest pain 12/29/2016  . GERD (gastroesophageal reflux disease) 02/27/2016  . Anxiety 02/27/2016  . Osteoarthritis of left knee 02/19/2014  . S/P total knee replacement 02/13/2014  . RBBB 02/08/2014  . HTN (hypertension) 02/08/2014  . PVD - bilateral 60-79% carotid strenosis 02/08/2014  . Mixed hyperlipidemia 11/29/2013  . Carpal tunnel syndrome, bilateral 11/23/2013  . DJD (degenerative joint disease) 11/23/2013  . Murmur- mild -mod AR, mild MR 11/10/2013  . Chronic constipation 12/16/2012  . Arthritis 10/08/2012  . Nicotine abuse 12/29/2011    Current Outpatient Medications on File Prior to Visit  Medication Sig Dispense Refill  . ALPRAZolam (XANAX) 0.5 MG tablet Take 1 tablet (0.5 mg total) by mouth 2 (two) times daily as needed. for sleep 60 tablet 5  . Ascorbic Acid (VITAMIN C PO) Take 1,000 mg by mouth daily.     Marland Kitchen aspirin 81 MG tablet Take 1 tablet (81 mg total) by mouth daily. 30 tablet   . bisoprolol-hydrochlorothiazide (ZIAC) 5-6.25 MG tablet TAKE 1 TABLET EVERY DAY 90 tablet 1  . Calcium Carbonate (CALCIUM 600 PO) Take 1,200 mg by mouth every other  day.     . cholecalciferol (VITAMIN D) 1000 UNITS tablet Take 1 tablet (1,000 Units total) by mouth daily. 30 tablet 5  . Glycerin-Hypromellose-PEG 400 ([facility] DRY EYE RELIEF) 0.2-0.2-1 % SOLN Place 1 drop into both eyes 2 (two) times a day.    . naphazoline-glycerin (CLEAR EYES REDNESS) 0.012-0.2 % SOLN Place 1-2 drops into both eyes 4 (four) times daily as needed for eye irritation. bausch and lomb    . omeprazole (PRILOSEC) 40 MG capsule TAKE 1 CAPSULE TWICE DAILY BEFORE MEALS 180 capsule 1  . pravastatin (PRAVACHOL) 40 MG tablet Take 1 tablet (40 mg total) by mouth every evening. 90 tablet 1  . Probiotic Product (PROBIOTIC DAILY PO) Take 1 capsule by mouth daily.     No current facility-administered medications on file prior to visit.    Past Medical History:  Diagnosis Date  . Allergy   . Anemia   . Anxiety   . Arthritis   . Carpal tunnel syndrome   . Constipation    senna C stool softeners help   . Depression   . Diverticulosis   . Dyslipidemia   . External hemorrhoids   . GERD (gastroesophageal reflux disease)   . Heart murmur    mild-moderate AR  . Hiatal hernia   . Hyperlipidemia    on meds   . Hypertension   . Internal hemorrhoids   . Osteoarthritis   . Pre-diabetes   . PVD (peripheral vascular disease) ([facility])    moderate carotid disease  .  RBBB   . Smoker   . Tubular adenoma of colon   . Vocal cord polyps     Past Surgical History:  Procedure Laterality Date  . ABDOMINAL HYSTERECTOMY  1994  . COLONOSCOPY    . COLONOSCOPY W/ POLYPECTOMY  2009  . HEMORRHOID SURGERY    . JOINT REPLACEMENT    . POLYPECTOMY    . right total knee arthroplasty     Dr. Emeterio Reeve 06-04-18  . SPINE SURGERY  08/16/2009  . TOTAL KNEE ARTHROPLASTY Left 02/13/2014   Procedure: TOTAL KNEE ARTHROPLASTY;  Surgeon: Alta Corning, MD;  Location: [facility];  Service: Orthopedics;  Laterality: Left;  . TOTAL KNEE ARTHROPLASTY Right 06/04/2018   Procedure: RIGHT TOTAL KNEE ARTHROPLASTY;  Surgeon:  Dorna Leitz, MD;  Location: [facility];  Service: Orthopedics;  Laterality: Right;  Adductor Block  . TUBAL LIGATION      Social History   Socioeconomic History  . Marital status: Married    Spouse name: Not on file  . Number of children: 2  . Years of education: Not on file  . Highest education level: Not on file  Occupational History  . Not on file  Tobacco Use  . Smoking status: Light Tobacco Smoker    Packs/day: 0.25    Years: 40.00    Pack years: 10.00    Types: Cigarettes  . Smokeless tobacco: Never Used  . Tobacco comment: PACK WILL LAST 3 days  Vaping Use  . Vaping Use: Never used  Substance and Sexual Activity  . Alcohol use: No  . Drug use: No  . Sexual activity: Not Currently  Other Topics Concern  . Not on file  Social History Narrative  . Not on file   Social Determinants of Health   Financial Resource Strain: Low Risk   . Difficulty of Paying Living Expenses: Not hard at all  Food Insecurity: No Food Insecurity  . Worried About Charity fundraiser in the Last Year: Never true  . Ran Out of Food in the Last Year: Never true  Transportation Needs: No Transportation Needs  . Lack of Transportation (Medical): No  . Lack of Transportation (Non-Medical): No  Physical Activity: Sufficiently Active  . Days of Exercise per Week: 4 days  . Minutes of Exercise per Session: 50 min  Stress: No Stress Concern Present  . Feeling of Stress : Not at all  Social Connections: Socially Integrated  . Frequency of Communication with Friends and Family: More than three times a week  . Frequency of Social Gatherings with Friends and Family: More than three times a week  . Attends Religious Services: More than 4 times per year  . Active Member of Clubs or Organizations: Yes  . Attends Archivist Meetings: More than 4 times per year  . Marital Status: Married    Family History  Problem Relation Age of Onset  . Cancer Father   . Prostate cancer Father   .  Diabetes Sister   . Stroke Maternal Grandfather   . Colitis Maternal Aunt   . Colon cancer Neg Hx   . Colon polyps Neg Hx   . Rectal cancer Neg Hx   . Stomach cancer Neg Hx   . Esophageal cancer Neg Hx     Review of Systems  Constitutional: Negative for chills and fever.  HENT: Positive for trouble swallowing (pills only - not with food).   Eyes: Negative for visual disturbance.  Respiratory: Negative for cough, shortness of  breath and wheezing.   Cardiovascular: Positive for chest pain (occ, comes and goes). Negative for palpitations and leg swelling.  Gastrointestinal: Negative for abdominal pain, blood in stool, constipation, diarrhea and nausea.       GERd controlled  Genitourinary: Negative for dysuria and hematuria.  Musculoskeletal: Positive for arthralgias (OA). Negative for back pain.  Skin: Negative for rash.  Neurological: Positive for light-headedness (occ). Negative for headaches (rare).  Psychiatric/Behavioral: Positive for dysphoric mood. The patient is nervous/anxious.        Objective:   Vitals:   03/14/20 0935  BP: 112/68  Pulse: 68  Temp: 98.6 F (37 C)  SpO2: 98%   Filed Weights   03/14/20 0935  Weight: 116 lb 3.2 oz (52.7 kg)   Body mass index is 22.69 kg/m.  BP Readings from Last 3 Encounters:  03/14/20 112/68  09/12/19 (!) 144/68  05/31/19 110/60    Wt Readings from Last 3 Encounters:  03/14/20 116 lb 3.2 oz (52.7 kg)  09/12/19 123 lb (55.8 kg)  05/31/19 123 lb (55.8 kg)     Physical Exam Constitutional: She appears well-developed and well-nourished. No distress.  HENT:  Head: Normocephalic and atraumatic.  Right Ear: External ear normal. Normal ear canal and TM Left Ear: External ear normal.  Normal ear canal and TM Mouth/Throat: Oropharynx is clear and moist.  Eyes: Conjunctivae and EOM are normal.  Neck: Neck supple. No tracheal deviation present. No thyromegaly present.  No carotid bruit  Cardiovascular: Normal rate, regular  rhythm and normal heart sounds.   No murmur heard.  No edema. Pulmonary/Chest: Effort normal and breath sounds normal. No respiratory distress. She has no wheezes. She has no rales.  Breast: deferred   Abdominal: Soft. She exhibits no distension. There is no tenderness.  Lymphadenopathy: She has no cervical adenopathy.  Skin: Skin is warm and dry. She is not diaphoretic.  Psychiatric: She has a normal mood and affect. Her behavior is normal.   Diabetic Foot Exam - Simple   Simple Foot Form Diabetic Foot exam was performed with the following findings: Yes 03/14/2020 10:09 AM  Visual Inspection No deformities, no ulcerations, no other skin breakdown bilaterally: Yes Sensation Testing Intact to touch and monofilament testing bilaterally: Yes Pulse Check Posterior Tibialis and Dorsalis pulse intact bilaterally: Yes Comments         Assessment & Plan:   Physical exam: Screening blood work    ordered Immunizations  Flu vaccine today, discussed shingrix Colonoscopy  Up to date  Mammogram  Up to date  Gyn  No longer sees one Dexa  Up to date  Eye exams  Due - will schedule Exercise  Walking outside  Weight  Normal weight Substance abuse  smoking-knows she should quit and trying to cut down.  Does not want any assistance at this time  See Problem List for Assessment and Plan of chronic medical problems.   This visit occurred during the SARS-CoV-2 public health emergency.  Safety protocols were in place, including screening questions prior to the visit, additional usage of staff PPE, and extensive cleaning of exam room while observing appropriate contact time as indicated for disinfecting solutions.

## 2020-03-13 NOTE — Patient Instructions (Addendum)
Blood work was ordered.    All other Health Maintenance issues reviewed.   All recommended immunizations and age-appropriate screenings are up-to-date or discussed.  Flu immunization administered today.    Medications reviewed and updated.  Changes include :   Consider restarting the sertraline.  Your prescription(s) have been submitted to your pharmacy. Please take as directed and contact our office if you believe you are having problem(s) with the medication(s).    Please followup in 6 months    Health Maintenance, Female Adopting a healthy lifestyle and getting preventive care are important in promoting health and wellness. Ask your health care provider about:  The right schedule for you to have regular tests and exams.  Things you can do on your own to prevent diseases and keep yourself healthy. What should I know about diet, weight, and exercise? Eat a healthy diet   Eat a diet that includes plenty of vegetables, fruits, low-fat dairy products, and lean protein.  Do not eat a lot of foods that are high in solid fats, added sugars, or sodium. Maintain a healthy weight Body mass index (BMI) is used to identify weight problems. It estimates body fat based on height and weight. Your health care provider can help determine your BMI and help you achieve or maintain a healthy weight. Get regular exercise Get regular exercise. This is one of the most important things you can do for your health. Most adults should:  Exercise for at least 150 minutes each week. The exercise should increase your heart rate and make you sweat (moderate-intensity exercise).  Do strengthening exercises at least twice a week. This is in addition to the moderate-intensity exercise.  Spend less time sitting. Even light physical activity can be beneficial. Watch cholesterol and blood lipids Have your blood tested for lipids and cholesterol at 71 years of age, then have this test every 5 years. Have your  cholesterol levels checked more often if:  Your lipid or cholesterol levels are high.  You are older than 71 years of age.  You are at high risk for heart disease. What should I know about cancer screening? Depending on your health history and family history, you may need to have cancer screening at various ages. This may include screening for:  Breast cancer.  Cervical cancer.  Colorectal cancer.  Skin cancer.  Lung cancer. What should I know about heart disease, diabetes, and high blood pressure? Blood pressure and heart disease  High blood pressure causes heart disease and increases the risk of stroke. This is more likely to develop in people who have high blood pressure readings, are of African descent, or are overweight.  Have your blood pressure checked: ? Every 3-5 years if you are 30-41 years of age. ? Every year if you are 5 years old or older. Diabetes Have regular diabetes screenings. This checks your fasting blood sugar level. Have the screening done:  Once every three years after age 57 if you are at a normal weight and have a low risk for diabetes.  More often and at a younger age if you are overweight or have a high risk for diabetes. What should I know about preventing infection? Hepatitis B If you have a higher risk for hepatitis B, you should be screened for this virus. Talk with your health care provider to find out if you are at risk for hepatitis B infection. Hepatitis C Testing is recommended for:  Everyone born from [birth year] through 1967.  Anyone with known  risk factors for hepatitis C. Sexually transmitted infections (STIs)  Get screened for STIs, including gonorrhea and chlamydia, if: ? You are sexually active and are younger than 71 years of age. ? You are older than 71 years of age and your health care provider tells you that you are at risk for this type of infection. ? Your sexual activity has changed since you were last screened, and you are  at increased risk for chlamydia or gonorrhea. Ask your health care provider if you are at risk.  Ask your health care provider about whether you are at high risk for HIV. Your health care provider may recommend a prescription medicine to help prevent HIV infection. If you choose to take medicine to prevent HIV, you should first get tested for HIV. You should then be tested every 3 months for as long as you are taking the medicine. Pregnancy  If you are about to stop having your period (premenopausal) and you may become pregnant, seek counseling before you get pregnant.  Take 400 to 800 micrograms (mcg) of folic acid every day if you become pregnant.  Ask for birth control (contraception) if you want to prevent pregnancy. Osteoporosis and menopause Osteoporosis is a disease in which the bones lose minerals and strength with aging. This can result in bone fractures. If you are 55 years old or older, or if you are at risk for osteoporosis and fractures, ask your health care provider if you should:  Be screened for bone loss.  Take a calcium or vitamin D supplement to lower your risk of fractures.  Be given hormone replacement therapy (HRT) to treat symptoms of menopause. Follow these instructions at home: Lifestyle  Do not use any products that contain nicotine or tobacco, such as cigarettes, e-cigarettes, and chewing tobacco. If you need help quitting, ask your health care provider.  Do not use street drugs.  Do not share needles.  Ask your health care provider for help if you need support or information about quitting drugs. Alcohol use  Do not drink alcohol if: ? Your health care provider tells you not to drink. ? You are pregnant, may be pregnant, or are planning to become pregnant.  If you drink alcohol: ? Limit how much you use to 0-1 drink a day. ? Limit intake if you are breastfeeding.  Be aware of how much alcohol is in your drink. In the [place] one drink equals one 12 oz  bottle of beer (355 mL), one 5 oz glass of wine (148 mL), or one 1 oz glass of hard liquor (44 mL). General instructions  Schedule regular health, dental, and eye exams.  Stay current with your vaccines.  Tell your health care provider if: ? You often feel depressed. ? You have ever been abused or do not feel safe at home. Summary  Adopting a healthy lifestyle and getting preventive care are important in promoting health and wellness.  Follow your health care provider's instructions about healthy diet, exercising, and getting tested or screened for diseases.  Follow your health care provider's instructions on monitoring your cholesterol and blood pressure. This information is not intended to replace advice given to you by your health care provider. Make sure you discuss any questions you have with your health care provider. Document Revised: 06/09/2018 Document Reviewed: 06/09/2018 Elsevier Patient Education  2020 [employer].

## 2020-03-14 ENCOUNTER — Ambulatory Visit (INDEPENDENT_AMBULATORY_CARE_PROVIDER_SITE_OTHER): Payer: Medicare HMO | Admitting: Internal Medicine

## 2020-03-14 ENCOUNTER — Encounter: Payer: Self-pay | Admitting: Internal Medicine

## 2020-03-14 ENCOUNTER — Other Ambulatory Visit: Payer: Self-pay

## 2020-03-14 VITALS — BP 112/68 | HR 68 | Temp 98.6°F | Ht 60.0 in | Wt 116.2 lb

## 2020-03-14 DIAGNOSIS — Z0000 Encounter for general adult medical examination without abnormal findings: Secondary | ICD-10-CM

## 2020-03-14 DIAGNOSIS — K219 Gastro-esophageal reflux disease without esophagitis: Secondary | ICD-10-CM | POA: Diagnosis not present

## 2020-03-14 DIAGNOSIS — Z23 Encounter for immunization: Secondary | ICD-10-CM

## 2020-03-14 DIAGNOSIS — F419 Anxiety disorder, unspecified: Secondary | ICD-10-CM | POA: Diagnosis not present

## 2020-03-14 DIAGNOSIS — F3289 Other specified depressive episodes: Secondary | ICD-10-CM | POA: Diagnosis not present

## 2020-03-14 DIAGNOSIS — E782 Mixed hyperlipidemia: Secondary | ICD-10-CM

## 2020-03-14 DIAGNOSIS — E119 Type 2 diabetes mellitus without complications: Secondary | ICD-10-CM

## 2020-03-14 DIAGNOSIS — I10 Essential (primary) hypertension: Secondary | ICD-10-CM | POA: Diagnosis not present

## 2020-03-14 MED ORDER — FLUTICASONE PROPIONATE 50 MCG/ACT NA SUSP: 2.0000 | Freq: Every day | NASAL | 1 refills | Status: DC

## 2020-03-14 MED ORDER — SERTRALINE HCL 25 MG PO TABS
25.0000 mg | ORAL_TABLET | Freq: Every day | ORAL | 3 refills | Status: DC
Start: 2020-03-14 — End: 2020-07-24

## 2020-03-14 MED ORDER — ALPRAZOLAM 0.5 MG PO TABS
0.5000 mg | ORAL_TABLET | Freq: Two times a day (BID) | ORAL | 5 refills | Status: DC | PRN
Start: 2020-03-14 — End: 2020-09-13

## 2020-03-14 MED ORDER — SERTRALINE HCL 25 MG PO TABS: 25.0000 mg | ORAL_TABLET | Freq: Every day | ORAL | 3 refills | Status: DC

## 2020-03-14 MED ORDER — FENOFIBRATE 160 MG PO TABS: 160.0000 mg | ORAL_TABLET | Freq: Every day | ORAL | 3 refills | Status: DC

## 2020-03-14 NOTE — Assessment & Plan Note (Signed)
Chronic BP well controlled Current regimen effective and well tolerated Continue current medications at current doses cmp  

## 2020-03-14 NOTE — Assessment & Plan Note (Signed)
Chronic Check lipid panel  Continue daily statin Continue exercise and healthy diet encouraged

## 2020-03-14 NOTE — Assessment & Plan Note (Signed)
Chronic Diet controlled Check A1c, urine microalbumin Continue regular exercise Continue low sugar/carb diet

## 2020-03-14 NOTE — Assessment & Plan Note (Signed)
Chronic GERD controlled Continue daily medication prilosec daily

## 2020-03-14 NOTE — Assessment & Plan Note (Signed)
Chronic Fairly controlled Has anxiety at times Discussed xanax vs sertraline Talked about restarting sertraline for better control - can still take xanax - can not take more than prescribed

## 2020-03-14 NOTE — Assessment & Plan Note (Signed)
Currently denies depression

## 2020-03-15 LAB — CBC WITH DIFFERENTIAL/PLATELET
Absolute Monocytes: 558 cells/uL (ref 200–950)
Basophils Absolute: 50 cells/uL (ref 0–200)
Basophils Relative: 0.8 %
Eosinophils Absolute: 248 cells/uL (ref 15–500)
Eosinophils Relative: 4 %
HCT: 35.8 % (ref 35.0–45.0)
Hemoglobin: 11.3 g/dL — ABNORMAL LOW (ref 11.7–15.5)
Lymphs Abs: 2238 cells/uL (ref 850–3900)
MCH: 26.5 pg — ABNORMAL LOW (ref 27.0–33.0)
MCHC: 31.6 g/dL — ABNORMAL LOW (ref 32.0–36.0)
MCV: 84 fL (ref 80.0–100.0)
MPV: 10.7 fL (ref 7.5–12.5)
Monocytes Relative: 9 %
Neutro Abs: 3106 cells/uL (ref 1500–7800)
Neutrophils Relative %: 50.1 %
Platelets: 300 10*3/uL (ref 140–400)
RBC: 4.26 10*6/uL (ref 3.80–5.10)
RDW: 12.1 % (ref 11.0–15.0)
Total Lymphocyte: 36.1 %
WBC: 6.2 10*3/uL (ref 3.8–10.8)

## 2020-03-15 LAB — COMPLETE METABOLIC PANEL WITH GFR
AG Ratio: 1.8 (calc) (ref 1.0–2.5)
ALT: 13 U/L (ref 6–29)
AST: 19 U/L (ref 10–35)
Albumin: 4.4 g/dL (ref 3.6–5.1)
Alkaline phosphatase (APISO): 46 U/L (ref 37–153)
BUN: 10 mg/dL (ref 7–25)
CO2: 30 mmol/L (ref 20–32)
Calcium: 10.1 mg/dL (ref 8.6–10.4)
Chloride: 104 mmol/L (ref 98–110)
Creat: 0.79 mg/dL (ref 0.60–0.93)
GFR, Est African American: 87 mL/min/{1.73_m2} (ref >=60)
GFR, Est Non African American: 75 mL/min/{1.73_m2} (ref >=60)
Globulin: 2.4 g/dL (calc) (ref 1.9–3.7)
Glucose, Bld: 76 mg/dL (ref 65–99)
Potassium: 3.9 mmol/L (ref 3.5–5.3)
Sodium: 141 mmol/L (ref 135–146)
Total Bilirubin: 0.4 mg/dL (ref 0.2–1.2)
Total Protein: 6.8 g/dL (ref 6.1–8.1)

## 2020-03-15 LAB — LIPID PANEL
Cholesterol: 132 mg/dL (ref <200)
HDL: 51 mg/dL (ref >=50)
LDL Cholesterol (Calc): 62 mg/dL (calc)
Non-HDL Cholesterol (Calc): 81 mg/dL (calc) (ref <130)
Total CHOL/HDL Ratio: 2.6 (calc) (ref <5.0)
Triglycerides: 104 mg/dL (ref <150)

## 2020-03-15 LAB — MICROALBUMIN / CREATININE URINE RATIO
Creatinine, Urine: 141 mg/dL (ref 20–275)
Microalb Creat Ratio: 4 mcg/mg creat (ref <30)
Microalb, Ur: 0.6 mg/dL

## 2020-03-15 LAB — HEMOGLOBIN A1C
Hgb A1c MFr Bld: 6.5 % of total Hgb — ABNORMAL HIGH (ref <5.7)
Mean Plasma Glucose: 140 (calc)
eAG (mmol/L): 7.7 (calc)

## 2020-03-16 ENCOUNTER — Ambulatory Visit (HOSPITAL_COMMUNITY)
Admission: RE | Admit: 2020-03-16 | Discharge: 2020-03-16 | Disposition: A | Discharge status: Home / Self Care | Payer: Medicare HMO | Source: Ambulatory Visit | Attending: Cardiovascular Disease | Admitting: Cardiovascular Disease

## 2020-03-16 ENCOUNTER — Other Ambulatory Visit: Payer: Self-pay

## 2020-03-16 ENCOUNTER — Other Ambulatory Visit (HOSPITAL_COMMUNITY): Payer: Self-pay | Admitting: Cardiology

## 2020-03-16 DIAGNOSIS — I6523 Occlusion and stenosis of bilateral carotid arteries: Secondary | ICD-10-CM

## 2020-04-16 DIAGNOSIS — H1045 Other chronic allergic conjunctivitis: Secondary | ICD-10-CM | POA: Diagnosis not present

## 2020-04-16 DIAGNOSIS — H04123 Dry eye syndrome of bilateral lacrimal glands: Secondary | ICD-10-CM | POA: Diagnosis not present

## 2020-04-16 DIAGNOSIS — H25813 Combined forms of age-related cataract, bilateral: Secondary | ICD-10-CM | POA: Diagnosis not present

## 2020-04-30 DIAGNOSIS — Z1231 Encounter for screening mammogram for malignant neoplasm of breast: Secondary | ICD-10-CM | POA: Diagnosis not present

## 2020-05-01 ENCOUNTER — Ambulatory Visit: Payer: Medicare HMO | Attending: Internal Medicine

## 2020-05-01 DIAGNOSIS — Z23 Encounter for immunization: Secondary | ICD-10-CM

## 2020-05-01 NOTE — Progress Notes (Signed)
   Cov[facility] Vaccination [facility]  Name:  Hannah [name]    MRN: [id number] DOB: 25-May-1951  05/01/2020  [name] was observed post Covid-19 immunization for 15 minutes without incident. She was provided with Vaccine Information Sheet and instruction to access the V-Safe system.   [name] was instructed to call 911 with any severe reactions post vaccine: Marland Kitchen Difficulty breathing  . Swelling of face and throat  . A fast heartbeat  . A bad rash all over body  . Dizziness and weakness

## 2020-05-15 DIAGNOSIS — R928 Other abnormal and inconclusive findings on diagnostic imaging of breast: Secondary | ICD-10-CM | POA: Diagnosis not present

## 2020-05-28 ENCOUNTER — Other Ambulatory Visit: Payer: Self-pay | Admitting: Cardiology

## 2020-07-23 NOTE — Progress Notes (Signed)
Subjective:    Patient ID: Hannah [name], female    DOB: [birth year], 70 y.o.   MRN: [id number]  HPI The patient is here for an acute visit.  She is here with her daughter.     Lump near L groin -  She has a lump for several months.  It is painful.  No change in size. Not red.  She just recently mentioned it to her daughter the other day.  She has been worried about this and has not been eating.   She has been worrying.  She is anxious and depressed.  She is not sleeping well  She is not eating and has lost weight.  She is feeling very weak.   Her daughter is concerned about her memory.  She often says yes when she means no and vice versa.  She is not a good historian.     Medications and allergies reviewed with patient and updated if appropriate.  Patient Active Problem List   Diagnosis Date Noted  . Pain of right clavicle 09/12/2019  . Nonspecific abnormal electrocardiogram (ECG) (EKG) 10/20/2018  . Educated about COVID-19 virus infection 10/20/2018  . Depression 07/17/2018  . Primary osteoarthritis of right knee 06/04/2018  . Bilateral carotid artery stenosis 09/24/2017  . Aortic valve sclerosis 09/24/2017  . Vocal cord polyps 09/07/2017  . Dysphagia 09/07/2017  . Diabetes mellitus without complication ([facility]) 40/98/1191  . GERD (gastroesophageal reflux disease) 02/27/2016  . Anxiety 02/27/2016  . Osteoarthritis of left knee 02/19/2014  . S/P total knee replacement 02/13/2014  . RBBB 02/08/2014  . HTN (hypertension) 02/08/2014  . PVD - bilateral 60-79% carotid strenosis 02/08/2014  . Mixed hyperlipidemia 11/29/2013  . Carpal tunnel syndrome, bilateral 11/23/2013  . DJD (degenerative joint disease) 11/23/2013  . Murmur- mild -mod AR, mild MR 11/10/2013  . Chronic constipation 12/16/2012  . Arthritis 10/08/2012  . Nicotine abuse 12/29/2011    Current Outpatient Medications on File Prior to Visit  Medication Sig Dispense Refill  . ALPRAZolam (XANAX) 0.5 MG tablet  Take 1 tablet (0.5 mg total) by mouth 2 (two) times daily as needed. for sleep 60 tablet 5  . Ascorbic Acid (VITAMIN C PO) Take 1,000 mg by mouth daily.     Marland Kitchen aspirin 81 MG tablet Take 1 tablet (81 mg total) by mouth daily. 30 tablet   . bisoprolol-hydrochlorothiazide (ZIAC) 5-6.25 MG tablet TAKE 1 TABLET EVERY DAY 90 tablet 1  . Calcium Carbonate (CALCIUM 600 PO) Take 1,200 mg by mouth every other day.     . cholecalciferol (VITAMIN D) 1000 UNITS tablet Take 1 tablet (1,000 Units total) by mouth daily. 30 tablet 5  . fenofibrate 160 MG tablet Take 1 tablet (160 mg total) by mouth daily. 90 tablet 3  . Glycerin-Hypromellose-PEG 400 ([facility] DRY EYE RELIEF) 0.2-0.2-1 % SOLN Place 1 drop into both eyes 2 (two) times a day.    . naphazoline-glycerin (CLEAR EYES REDNESS) 0.012-0.2 % SOLN Place 1-2 drops into both eyes 4 (four) times daily as needed for eye irritation. bausch and lomb    . omeprazole (PRILOSEC) 40 MG capsule TAKE 1 CAPSULE TWICE DAILY BEFORE MEALS 180 capsule 1  . pravastatin (PRAVACHOL) 40 MG tablet TAKE 1 TABLET EVERY EVENING 90 tablet 1  . Probiotic Product (PROBIOTIC DAILY PO) Take 1 capsule by mouth daily.     No current facility-administered medications on file prior to visit.    Past Medical History:  Diagnosis Date  . Allergy   .  Anemia   . Anxiety   . Arthritis   . Carpal tunnel syndrome   . Constipation    senna C stool softeners help   . Depression   . Diverticulosis   . Dyslipidemia   . External hemorrhoids   . GERD (gastroesophageal reflux disease)   . Heart murmur    mild-moderate AR  . Hiatal hernia   . Hyperlipidemia    on meds   . Hypertension   . Internal hemorrhoids   . Osteoarthritis   . Pre-diabetes   . PVD (peripheral vascular disease) ([facility])    moderate carotid disease  . RBBB   . Smoker   . Tubular adenoma of colon   . Vocal cord polyps     Past Surgical History:  Procedure Laterality Date  . ABDOMINAL HYSTERECTOMY  1994  . COLONOSCOPY     . COLONOSCOPY W/ POLYPECTOMY  2009  . HEMORRHOID SURGERY    . JOINT REPLACEMENT    . POLYPECTOMY    . right total knee arthroplasty     Dr. Emeterio Reeve 06-04-18  . SPINE SURGERY  08/16/2009  . TOTAL KNEE ARTHROPLASTY Left 02/13/2014   Procedure: TOTAL KNEE ARTHROPLASTY;  Surgeon: Alta Corning, MD;  Location: [facility];  Service: Orthopedics;  Laterality: Left;  . TOTAL KNEE ARTHROPLASTY Right 06/04/2018   Procedure: RIGHT TOTAL KNEE ARTHROPLASTY;  Surgeon: Dorna Leitz, MD;  Location: [facility];  Service: Orthopedics;  Laterality: Right;  Adductor Block  . TUBAL LIGATION      Social History   Socioeconomic History  . Marital status: Married    Spouse name: Not on file  . Number of children: 2  . Years of education: Not on file  . Highest education level: Not on file  Occupational History  . Not on file  Tobacco Use  . Smoking status: Light Tobacco Smoker    Packs/day: 0.25    Years: 40.00    Pack years: 10.00    Types: Cigarettes  . Smokeless tobacco: Never Used  . Tobacco comment: PACK WILL LAST 3 days  Vaping Use  . Vaping Use: Never used  Substance and Sexual Activity  . Alcohol use: No  . Drug use: No  . Sexual activity: Not Currently  Other Topics Concern  . Not on file  Social History Narrative  . Not on file   Social Determinants of Health   Financial Resource Strain: Low Risk   . Difficulty of Paying Living Expenses: Not hard at all  Food Insecurity: No Food Insecurity  . Worried About Charity fundraiser in the Last Year: Never true  . Ran Out of Food in the Last Year: Never true  Transportation Needs: No Transportation Needs  . Lack of Transportation (Medical): No  . Lack of Transportation (Non-Medical): No  Physical Activity: Sufficiently Active  . Days of Exercise per Week: 4 days  . Minutes of Exercise per Session: 50 min  Stress: No Stress Concern Present  . Feeling of Stress : Not at all  Social Connections: Socially Integrated  . Frequency of  Communication with Friends and Family: More than three times a week  . Frequency of Social Gatherings with Friends and Family: More than three times a week  . Attends Religious Services: More than 4 times per year  . Active Member of Clubs or Organizations: Yes  . Attends Archivist Meetings: More than 4 times per year  . Marital Status: Married    Family History  Problem Relation Age of Onset  . Cancer Father   . Prostate cancer Father   . Diabetes Sister   . Stroke Maternal Grandfather   . Colitis Maternal Aunt   . Colon cancer Neg Hx   . Colon polyps Neg Hx   . Rectal cancer Neg Hx   . Stomach cancer Neg Hx   . Esophageal cancer Neg Hx     Review of Systems  Constitutional: Positive for appetite change and unexpected weight change. Negative for fever.  Respiratory: Negative for cough, shortness of breath and wheezing.   Cardiovascular: Positive for palpitations (when she does not eat). Negative for chest pain and leg swelling.  Neurological: Negative for light-headedness and headaches.  Psychiatric/Behavioral: Positive for dysphoric mood and sleep disturbance. The patient is nervous/anxious.        Memory issues       Objective:   Vitals:   07/24/20 0800  BP: 120/78  Pulse: (!) 59  Temp: 98.2 F (36.8 C)  SpO2: 99%   BP Readings from Last 3 Encounters:  07/24/20 120/78  03/14/20 112/68  09/12/19 (!) 144/68   Wt Readings from Last 3 Encounters:  07/24/20 111 lb (50.3 kg)  03/14/20 116 lb 3.2 oz (52.7 kg)  09/12/19 123 lb (55.8 kg)   Body mass index is 21.68 kg/m.   Physical Exam Constitutional:      General: She is not in acute distress.    Appearance: Normal appearance. She is not ill-appearing.  HENT:     Head: Normocephalic and atraumatic.  Cardiovascular:     Rate and Rhythm: Normal rate and regular rhythm.  Pulmonary:     Effort: Pulmonary effort is normal.     Breath sounds: Normal breath sounds.  Abdominal:     General: There is no  distension.     Palpations: Abdomen is soft.     Tenderness: There is no abdominal tenderness. There is no guarding or rebound.     Hernia: A hernia (left inguinal region - non-tender on exam) is present.  Musculoskeletal:     Right lower leg: No edema.     Left lower leg: No edema.  Skin:    General: Skin is warm and dry.     Findings: No erythema.  Neurological:     General: No focal deficit present.     Mental Status: She is alert.  Psychiatric:     Comments: Depressed mood            Assessment & Plan:    See Problem List for Assessment and Plan of chronic medical problems.    This visit occurred during the SARS-CoV-2 public health emergency.  Safety protocols were in place, including screening questions prior to the visit, additional usage of staff PPE, and extensive cleaning of exam room while observing appropriate contact time as indicated for disinfecting solutions.

## 2020-07-24 ENCOUNTER — Encounter: Payer: Self-pay | Admitting: Internal Medicine

## 2020-07-24 ENCOUNTER — Ambulatory Visit (INDEPENDENT_AMBULATORY_CARE_PROVIDER_SITE_OTHER): Payer: Medicare HMO | Admitting: Internal Medicine

## 2020-07-24 ENCOUNTER — Other Ambulatory Visit: Payer: Self-pay

## 2020-07-24 VITALS — BP 120/78 | HR 59 | Temp 98.2°F | Ht 60.0 in | Wt 111.0 lb

## 2020-07-24 DIAGNOSIS — F3289 Other specified depressive episodes: Secondary | ICD-10-CM | POA: Diagnosis not present

## 2020-07-24 DIAGNOSIS — E119 Type 2 diabetes mellitus without complications: Secondary | ICD-10-CM

## 2020-07-24 DIAGNOSIS — F419 Anxiety disorder, unspecified: Secondary | ICD-10-CM

## 2020-07-24 DIAGNOSIS — I10 Essential (primary) hypertension: Secondary | ICD-10-CM

## 2020-07-24 DIAGNOSIS — R634 Abnormal weight loss: Secondary | ICD-10-CM | POA: Diagnosis not present

## 2020-07-24 DIAGNOSIS — R413 Other amnesia: Secondary | ICD-10-CM

## 2020-07-24 DIAGNOSIS — K4090 Unilateral inguinal hernia, without obstruction or gangrene, not specified as recurrent: Secondary | ICD-10-CM | POA: Diagnosis not present

## 2020-07-24 LAB — CBC WITH DIFFERENTIAL/PLATELET
Basophils Absolute: 0.1 10*3/uL (ref 0.0–0.1)
Basophils Relative: 0.9 % (ref 0.0–3.0)
Eosinophils Absolute: 0.2 10*3/uL (ref 0.0–0.7)
Eosinophils Relative: 2.7 % (ref 0.0–5.0)
HCT: 33.8 % — ABNORMAL LOW (ref 36.0–46.0)
Hemoglobin: 10.8 g/dL — ABNORMAL LOW (ref 12.0–15.0)
Lymphocytes Relative: 30.3 % (ref 12.0–46.0)
Lymphs Abs: 2.2 10*3/uL (ref 0.7–4.0)
MCHC: 31.9 g/dL (ref 30.0–36.0)
MCV: 81.5 fl (ref 78.0–100.0)
Monocytes Absolute: 0.5 10*3/uL (ref 0.1–1.0)
Monocytes Relative: 6.5 % (ref 3.0–12.0)
Neutro Abs: 4.3 10*3/uL (ref 1.4–7.7)
Neutrophils Relative %: 59.6 % (ref 43.0–77.0)
Platelets: 322 10*3/uL (ref 150.0–400.0)
RBC: 4.14 Mil/uL (ref 3.87–5.11)
RDW: 13.5 % (ref 11.5–15.5)
WBC: 7.3 10*3/uL (ref 4.0–10.5)

## 2020-07-24 LAB — TSH: TSH: 0.7 u[IU]/mL (ref 0.35–4.50)

## 2020-07-24 LAB — COMPREHENSIVE METABOLIC PANEL
ALT: 11 U/L (ref 0–35)
AST: 18 U/L (ref 0–37)
Albumin: 4.3 g/dL (ref 3.5–5.2)
Alkaline Phosphatase: 37 U/L — ABNORMAL LOW (ref 39–117)
BUN: 15 mg/dL (ref 6–23)
CO2: 32 mEq/L (ref 19–32)
Calcium: 10.5 mg/dL (ref 8.4–10.5)
Chloride: 105 mEq/L (ref 96–112)
Creatinine, Ser: 0.69 mg/dL (ref 0.40–1.20)
GFR: 86.98 mL/min (ref >60.00)
Glucose, Bld: 100 mg/dL — ABNORMAL HIGH (ref 70–99)
Potassium: 4 mEq/L (ref 3.5–5.1)
Sodium: 142 mEq/L (ref 135–145)
Total Bilirubin: 0.4 mg/dL (ref 0.2–1.2)
Total Protein: 7.3 g/dL (ref 6.0–8.3)

## 2020-07-24 LAB — VITAMIN B12: Vitamin B-12: 317 pg/mL (ref 211–911)

## 2020-07-24 LAB — HEMOGLOBIN A1C: Hgb A1c MFr Bld: 6.5 % (ref 4.6–6.5)

## 2020-07-24 MED ORDER — MIRTAZAPINE 15 MG PO TABS: 7.5000 mg | ORAL_TABLET | Freq: Every day | ORAL | 3 refills | Status: DC

## 2020-07-24 NOTE — Assessment & Plan Note (Signed)
New problem Daughter is concerned and I feel this needs to be further evaluated  Referred to dr Delice Lesch and neuro-psychology Tsh, B12, cbc, cmp Advised quitting smoking

## 2020-07-24 NOTE — Assessment & Plan Note (Signed)
Chronic BP well controlled Continue ziac 5-6.25 mg daily,  cmp

## 2020-07-24 NOTE — Assessment & Plan Note (Signed)
Chronic Diet controlled Check a1c Low sugar / carb diet

## 2020-07-24 NOTE — Patient Instructions (Addendum)
°  Blood work was ordered.     Medications changes include :   Start mirtazapine 7.5 mg nightly  Your prescription(s) have been submitted to your pharmacy. Please take as directed and contact our office if you believe you are having problem(s) with the medication(s).   A referral was ordered for surgery and neurology       Someone from their office will call you to schedule an appointment.    Please followup in 1 month

## 2020-07-24 NOTE — Assessment & Plan Note (Signed)
Chronic No longer taking sertraline - did not feel good on it Taking alprazolam 0.5 mg BID prn Not controlled Start remeron 7.5 mg nightly

## 2020-07-24 NOTE — Assessment & Plan Note (Signed)
Acute  New problem Refer to surgery - she is having pain and likely will need surgery

## 2020-07-24 NOTE — Assessment & Plan Note (Signed)
Acute Has had this in the past Dec appetite, not eating much Has anxiety,depression - both not controlled Daughter has gotten her some boost Stressed eating and making herself eat Start remeron 7.5 mg nightly - this has helped in the past

## 2020-07-24 NOTE — Assessment & Plan Note (Signed)
Chronic Not controlled Start remeron 7.5 mg nightly

## 2020-07-25 ENCOUNTER — Encounter: Payer: Self-pay | Admitting: Counselor

## 2020-07-26 ENCOUNTER — Telehealth: Payer: Self-pay | Admitting: Internal Medicine

## 2020-07-26 DIAGNOSIS — R413 Other amnesia: Secondary | ICD-10-CM

## 2020-07-26 NOTE — Telephone Encounter (Signed)
MRI ordered.  She can take one - two of her xanax prior to the MRI if needed

## 2020-07-26 NOTE — Telephone Encounter (Signed)
We can do an MRI of her brain - does she think her mom would tolerate that?  If not we could do a Ct scan which is a quicker test but gives a little less info.

## 2020-07-26 NOTE — Telephone Encounter (Signed)
Spoke to pt's dtr and she states the more she observes pt the more she thinks she probably did have a stroke. Pt isn't scheduled till March with Neurology and is wondering if there is something they need to do in the meantime. She states neurology is going to try to get her in earlier.  Can you please call dtr to discuss her concerns? thanks

## 2020-07-26 NOTE — Addendum Note (Signed)
Addended by: [name] on: 07/26/2020 07:06 AM   Modules accepted: Orders

## 2020-07-27 NOTE — Telephone Encounter (Signed)
Message left for daughter today.

## 2020-07-31 ENCOUNTER — Other Ambulatory Visit: Payer: Self-pay

## 2020-07-31 ENCOUNTER — Other Ambulatory Visit: Payer: Self-pay | Admitting: Internal Medicine

## 2020-07-31 ENCOUNTER — Telehealth: Payer: Self-pay | Admitting: Internal Medicine

## 2020-07-31 ENCOUNTER — Ambulatory Visit
Admission: RE | Admit: 2020-07-31 | Discharge: 2020-07-31 | Disposition: A | Discharge status: Home / Self Care | Payer: Medicare HMO | Source: Ambulatory Visit | Attending: Internal Medicine | Admitting: Internal Medicine

## 2020-07-31 DIAGNOSIS — I6782 Cerebral ischemia: Secondary | ICD-10-CM | POA: Diagnosis not present

## 2020-07-31 DIAGNOSIS — C801 Malignant (primary) neoplasm, unspecified: Secondary | ICD-10-CM

## 2020-07-31 DIAGNOSIS — G9389 Other specified disorders of brain: Secondary | ICD-10-CM | POA: Diagnosis not present

## 2020-07-31 DIAGNOSIS — G939 Disorder of brain, unspecified: Secondary | ICD-10-CM

## 2020-07-31 DIAGNOSIS — R413 Other amnesia: Secondary | ICD-10-CM | POA: Diagnosis not present

## 2020-07-31 DIAGNOSIS — G936 Cerebral edema: Secondary | ICD-10-CM | POA: Diagnosis not present

## 2020-07-31 HISTORY — DX: Malignant (primary) neoplasm, unspecified: C80.1

## 2020-07-31 NOTE — Telephone Encounter (Signed)
sPoke to daughter t lunch

## 2020-07-31 NOTE — Addendum Note (Signed)
Addended by: [name] on: 07/31/2020 12:33 PM   Modules accepted: Orders

## 2020-07-31 NOTE — Telephone Encounter (Signed)
Please call daughter with MRI results

## 2020-08-02 ENCOUNTER — Ambulatory Visit
Admission: RE | Admit: 2020-08-02 | Discharge: 2020-08-02 | Disposition: A | Discharge status: Home / Self Care | Payer: Medicare HMO | Source: Ambulatory Visit | Attending: Internal Medicine | Admitting: Internal Medicine

## 2020-08-02 ENCOUNTER — Telehealth: Payer: Self-pay | Admitting: Internal Medicine

## 2020-08-02 ENCOUNTER — Other Ambulatory Visit: Payer: Self-pay

## 2020-08-02 ENCOUNTER — Other Ambulatory Visit: Payer: Self-pay | Admitting: Internal Medicine

## 2020-08-02 ENCOUNTER — Ambulatory Visit (INDEPENDENT_AMBULATORY_CARE_PROVIDER_SITE_OTHER)
Admission: RE | Admit: 2020-08-02 | Discharge: 2020-08-02 | Disposition: A | Discharge status: Home / Self Care | Payer: Medicare HMO | Source: Ambulatory Visit | Attending: Internal Medicine | Admitting: Internal Medicine

## 2020-08-02 ENCOUNTER — Other Ambulatory Visit: Payer: Self-pay | Admitting: Radiation Therapy

## 2020-08-02 DIAGNOSIS — G939 Disorder of brain, unspecified: Secondary | ICD-10-CM

## 2020-08-02 DIAGNOSIS — R634 Abnormal weight loss: Secondary | ICD-10-CM

## 2020-08-02 DIAGNOSIS — J432 Centrilobular emphysema: Secondary | ICD-10-CM | POA: Diagnosis not present

## 2020-08-02 DIAGNOSIS — R918 Other nonspecific abnormal finding of lung field: Secondary | ICD-10-CM

## 2020-08-02 DIAGNOSIS — G9389 Other specified disorders of brain: Secondary | ICD-10-CM | POA: Diagnosis not present

## 2020-08-02 DIAGNOSIS — I77810 Thoracic aortic ectasia: Secondary | ICD-10-CM | POA: Diagnosis not present

## 2020-08-02 DIAGNOSIS — Z8669 Personal history of other diseases of the nervous system and sense organs: Secondary | ICD-10-CM | POA: Diagnosis not present

## 2020-08-02 DIAGNOSIS — G936 Cerebral edema: Secondary | ICD-10-CM | POA: Diagnosis not present

## 2020-08-02 DIAGNOSIS — C7931 Secondary malignant neoplasm of brain: Secondary | ICD-10-CM

## 2020-08-02 DIAGNOSIS — I2510 Atherosclerotic heart disease of native coronary artery without angina pectoris: Secondary | ICD-10-CM | POA: Diagnosis not present

## 2020-08-02 DIAGNOSIS — K8020 Calculus of gallbladder without cholecystitis without obstruction: Secondary | ICD-10-CM | POA: Diagnosis not present

## 2020-08-02 MED ORDER — IOHEXOL 300 MG/ML  SOLN
100.0000 mL | Freq: Once | INTRAMUSCULAR | Status: AC | PRN
  Administered 2020-08-02: 100 mL via INTRAVENOUS

## 2020-08-02 MED ORDER — DEXAMETHASONE 4 MG PO TABS: 4.0000 mg | ORAL_TABLET | Freq: Every day | ORAL | 0 refills | Status: DC

## 2020-08-02 MED ORDER — GADOBENATE DIMEGLUMINE 529 MG/ML IV SOLN
10.0000 mL | Freq: Once | INTRAVENOUS | Status: AC | PRN
  Administered 2020-08-02: 10 mL via INTRAVENOUS

## 2020-08-02 NOTE — Telephone Encounter (Signed)
Called daughter - discussed MRI results.    Ct ab/p and Ct chest ordered to evaluate for primary or other metastatic lesions.    Current symptoms  - right hand weakness.  Not eating much.  Quieter than usual.    Daughter would like to avoid the hospital.   Will see if we can get Ct 's done asap.     Advised daughter if neuro symptoms worsen - needs to take her mom to the ED.

## 2020-08-02 NOTE — Addendum Note (Signed)
Addended by: [name] on: 08/02/2020 02:59 PM   Modules accepted: Orders

## 2020-08-02 NOTE — Telephone Encounter (Signed)
Discussed case with Dr Sherrilee Gilles - will start dexamethasone 4 mg daily.      Case to be discussed at spine/tumor board on Monday

## 2020-08-03 ENCOUNTER — Other Ambulatory Visit: Payer: Self-pay | Admitting: Internal Medicine

## 2020-08-03 ENCOUNTER — Encounter: Payer: Self-pay | Admitting: *Deleted

## 2020-08-03 ENCOUNTER — Telehealth: Payer: Self-pay | Admitting: Physician Assistant

## 2020-08-03 ENCOUNTER — Telehealth: Payer: Self-pay | Admitting: Internal Medicine

## 2020-08-03 DIAGNOSIS — R918 Other nonspecific abnormal finding of lung field: Secondary | ICD-10-CM

## 2020-08-03 MED ORDER — DEXAMETHASONE 4 MG PO TABS: 4.0000 mg | ORAL_TABLET | Freq: Two times a day (BID) | ORAL | 0 refills | Status: DC

## 2020-08-03 NOTE — Progress Notes (Signed)
I received referral on Hannah [name].  I updated new patient scheduler to call and schedule her to be seen on 08/06/20 with Cassie and Dr. Julien Nordmann.

## 2020-08-03 NOTE — Telephone Encounter (Signed)
Hannah [name] has been cld and scheduled to see Cassie on 2/8 at 930am w/labs at 9am. Appt date and time has been given tot he pt's daughter

## 2020-08-03 NOTE — Telephone Encounter (Signed)
Received a new pt referal from Dr. Quay Burow for brain mets. Hannah [name] has been scheduled to see Dr. Mickeal Skinner on 2/7 at 930am. Appt date and time has been given to the pt's daughter. Aware to arrive 20 minutes early.

## 2020-08-03 NOTE — Progress Notes (Addendum)
[place] [phone]   [phone]  CONSULT NOTE  REFERRING PHYSICIAN: Dr. Quay Burow  REASON FOR CONSULTATION:  Stage IV Lung Cancer  HPI Hannah [name] is a 70 y.o. female with a past medical history significant for tobacco abuse, hypertension, right bundle branch block, hyperlipidemia, GERD, diabetes mellitus, carpal tunnel, arthritis, degenerative joint disease, osteoarthritis, heart murmur, hemorrhoids, anxiety, and depression is referred to the clinic for evaluation of newly diagnosed lung cancer.  The patient's evaluation began on 07/24/2020. The patient presented to her primary care provider's office for an acute visit for the chief complaint of left groin lump, decreased appetite, weight loss, weakness, and memory changes. Regarding the inguinal lump, the patient was referred to surgery for possible inguinal hernia. Regarding her decreased memory, the patient was referred to her a neuropsychologist. Following the office visit, the patient's daughter called back concerned that her mother may have had a stroke due to right hand weakness and inability to verbally express herself. The patient then had a stat MRI performed on 07/31/2020 which showed a left frontal lobe lesion measuring 2.2 x 1.8 x 1.4 cm with surrounding edema he had a 3 to 4 mm right midline shift. The findings are favored to be secondary to metastatic disease. Dr. Quay Burow reached out to neuro oncologist, Dr. Mickeal Skinner who recommended starting the patient on Decadron 4 mg daily. The patient had a formal visit with neuro oncology yesterday who arranged for a steroid taper. Since starting steroids, the patient has noticed increased ability/movement in her right hand. Her speech is also improving as well.  The patient's PCP then ordered a CT scan the chest, abdomen, and pelvis to find out the primary source of malignancy. The patient's CT showed a 5.4 cm right upper lobe lung mass abutting and mildly  distorting the minor fissure. There is also a tiny lingular nodule that warrants follow-up. There is no other evidence of metastatic disease in the abdomen and pelvis.   The patient was subsequently referred to the clinic for further evaluation and management of her findings.  Overall, the patient's main concern was related to the right hand weakness and speech concerns. She also reportedly lost approximately 15 pounds in the last 16 months. She denies any changes in her breathing including chest pain, shortness of breath, cough, or hemoptysis. She reports her baseline constipation and hemorrhoids. The patient denies any nausea, vomiting, or diarrhea. She denies any headaches but reports soreness in the right neck and she previously was experiencing a "ticking sound in her ear which has since subsided.  The patient's family history consists of a mother who had dementia. The patient's father had prostate cancer and heart valve issues. The patient has 1 sister with diabetes mellitus. The patient has another sister who had lymphoma. The patient has another sister with arthritis.  The patient used to work in a warehouse. She is married. She is has 3 children. She had been smoking for proximately 56 years averaging 2 packs/day. The patient quit smoking approximately 2 weeks ago. Patient denies any drug or alcohol use.   HPI  Past Medical History:  Diagnosis Date  . Allergy   . Anemia   . Anxiety   . Arthritis   . Carpal tunnel syndrome   . Constipation    senna C stool softeners help   . Depression   . Diverticulosis   . Dyslipidemia   . External hemorrhoids   . GERD (gastroesophageal reflux disease)   .  Heart murmur    mild-moderate AR  . Hiatal hernia   . Hyperlipidemia    on meds   . Hypertension   . Internal hemorrhoids   . Osteoarthritis   . Pre-diabetes   . PVD (peripheral vascular disease) (HCC)    moderate carotid disease  . RBBB   . Smoker   . Tubular adenoma of colon   .  Vocal cord polyps     Past Surgical History:  Procedure Laterality Date  . ABDOMINAL HYSTERECTOMY  1994  . COLONOSCOPY    . COLONOSCOPY W/ POLYPECTOMY  2009  . HEMORRHOID SURGERY    . JOINT REPLACEMENT    . POLYPECTOMY    . right total knee arthroplasty     Dr. Emeterio Reeve 06-04-18  . SPINE SURGERY  08/16/2009  . TOTAL KNEE ARTHROPLASTY Left 02/13/2014   Procedure: TOTAL KNEE ARTHROPLASTY;  Surgeon: Alta Corning, MD;  Location: [facility];  Service: Orthopedics;  Laterality: Left;  . TOTAL KNEE ARTHROPLASTY Right 06/04/2018   Procedure: RIGHT TOTAL KNEE ARTHROPLASTY;  Surgeon: Dorna Leitz, MD;  Location: [facility];  Service: Orthopedics;  Laterality: Right;  Adductor Block  . TUBAL LIGATION      Family History  Problem Relation Age of Onset  . Cancer Father   . Prostate cancer Father   . Diabetes Sister   . Stroke Maternal Grandfather   . Colitis Maternal Aunt   . Colon cancer Neg Hx   . Colon polyps Neg Hx   . Rectal cancer Neg Hx   . Stomach cancer Neg Hx   . Esophageal cancer Neg Hx     Social History Social History   Tobacco Use  . Smoking status: Light Tobacco Smoker    Packs/day: 0.25    Years: 40.00    Pack years: 10.00    Types: Cigarettes  . Smokeless tobacco: Never Used  . Tobacco comment: PACK WILL LAST 3 days  Vaping Use  . Vaping Use: Never used  Substance Use Topics  . Alcohol use: No  . Drug use: No    Allergies  Allergen Reactions  . Amlodipine     Rash, swelling  . Chantix [Varenicline Tartrate] Other (See Comments)    Tongue swell,sob  . Clarithromycin Rash  . Lisinopril Hives  . Simvastatin Hives  . Wellbutrin [Bupropion Hcl] Hives  . Lipitor [Atorvastatin]     Dizziness per patient  . Sertraline     Makes her feel like she is going to kill someone    Current Outpatient Medications  Medication Sig Dispense Refill  . ALPRAZolam (XANAX) 0.5 MG tablet Take 1 tablet (0.5 mg total) by mouth 2 (two) times daily as needed. for sleep 60 tablet 5  .  Ascorbic Acid (VITAMIN C PO) Take 1,000 mg by mouth daily.     Marland Kitchen aspirin 81 MG tablet Take 1 tablet (81 mg total) by mouth daily. 30 tablet   . bisoprolol-hydrochlorothiazide (ZIAC) 5-6.25 MG tablet TAKE 1 TABLET EVERY DAY 90 tablet 1  . Calcium Carbonate (CALCIUM 600 PO) Take 1,200 mg by mouth every other day.     . cholecalciferol (VITAMIN D) 1000 UNITS tablet Take 1 tablet (1,000 Units total) by mouth daily. 30 tablet 5  . dexamethasone (DECADRON) 4 MG tablet Take 1 tablet (4 mg total) by mouth daily. Take with food. 30 tablet 0  . fenofibrate 160 MG tablet Take 1 tablet (160 mg total) by mouth daily. 90 tablet 3  . Glycerin-Hypromellose-PEG 400 ([facility]  DRY EYE RELIEF) 0.2-0.2-1 % SOLN Place 1 drop into both eyes 2 (two) times a day.    . mirtazapine (REMERON) 15 MG tablet Take 0.5 tablets (7.5 mg total) by mouth at bedtime. 15 tablet 3  . naphazoline-glycerin (CLEAR EYES REDNESS) 0.012-0.2 % SOLN Place 1-2 drops into both eyes 4 (four) times daily as needed for eye irritation. bausch and lomb    . omeprazole (PRILOSEC) 40 MG capsule TAKE 1 CAPSULE TWICE DAILY BEFORE MEALS 180 capsule 1  . pravastatin (PRAVACHOL) 40 MG tablet TAKE 1 TABLET EVERY EVENING 90 tablet 1  . Probiotic Product (PROBIOTIC DAILY PO) Take 1 capsule by mouth daily.     No current facility-administered medications for this visit.    REVIEW OF SYSTEMS:   Review of Systems  Constitutional: Positive for fatigue, weight loss, and appetite change. Negative for chills and fever. HENT: Negative for mouth sores, nosebleeds, sore throat and trouble swallowing.   Eyes: Negative for eye problems and icterus.  Respiratory: Negative for cough, hemoptysis, shortness of breath and wheezing.   Cardiovascular: Negative for chest pain and leg swelling.  Gastrointestinal: Positive for hemorrhoids and constipation. Negative for abdominal pain, diarrhea, nausea and vomiting.  Genitourinary: Negative for bladder incontinence, difficulty  urinating, dysuria, frequency and hematuria.   Musculoskeletal: Negative for back pain, gait problem, neck pain and neck stiffness.  Skin: Negative for itching and rash.  Neurological: Positive for right hand weakness. Positive for expressive speech changes. Negative for dizziness, gait problem, headaches, light-headedness and seizures.  Hematological: Negative for adenopathy. Does not bruise/bleed easily.  Psychiatric/Behavioral: Negative for confusion, depression and sleep disturbance. The patient is not nervous/anxious.     PHYSICAL EXAMINATION:  Blood pressure (!) 143/67, pulse (!) 41, temperature 98.7 F (37.1 C), temperature source Tympanic, resp. rate 19, height 5' (1.524 m), weight 106 lb 4.8 oz (48.2 kg), SpO2 100 %.  ECOG PERFORMANCE STATUS: 1 - Symptomatic but completely ambulatory  Physical Exam  Constitutional: Oriented to person, place, and time and thin appearing female and in no distress.   HENT:  Head: Normocephalic and atraumatic.  Mouth/Throat: Oropharynx is clear and moist. No oropharyngeal exudate.  Eyes: Conjunctivae are normal. Right eye exhibits no discharge. Left eye exhibits no discharge. No scleral icterus.  Neck: Normal range of motion. Neck supple.  Cardiovascular: Normal rate, regular rhythm, normal heart sounds and intact distal pulses.   Pulmonary/Chest: Effort normal. Decreased breath sounds in the right lung. No respiratory distress. No wheezes. No rales.  Abdominal: Soft. Bowel sounds are normal. Exhibits no distension and no mass. There is no tenderness.  Musculoskeletal: Normal range of motion. Exhibits no edema.  Lymphadenopathy:    No cervical adenopathy.  Neurological: Alert and oriented to person, place, and time. Exhibits normal muscle weakness. gait normal. Coordination normal.  Skin: Skin is warm and dry. No rash noted. Not diaphoretic. No erythema. No pallor.  Psychiatric: Mood, memory and judgment normal.  Vitals reviewed.  LABORATORY  DATA: Lab Results  Component Value Date   WBC 9.1 08/07/2020   HGB 12.1 08/07/2020   HCT 39.3 08/07/2020   MCV 83.6 08/07/2020   PLT 312 08/07/2020      Chemistry      Component Value Date/Time   NA 137 08/07/2020 0856   K 4.5 08/07/2020 0856   CL 101 08/07/2020 0856   CO2 29 08/07/2020 0856   BUN 26 (H) 08/07/2020 0856   CREATININE 0.92 08/07/2020 0856   CREATININE 0.79 03/14/2020 1016  Component Value Date/Time   CALCIUM 10.4 (H) 08/07/2020 0856   ALKPHOS 42 08/07/2020 0856   AST 17 08/07/2020 0856   ALT 11 08/07/2020 0856   BILITOT 0.3 08/07/2020 0856       RADIOGRAPHIC STUDIES: MR Brain Wo Contrast  Result Date: 07/31/2020 CLINICAL DATA:  Memory changes. Memory loss. Additional history provided by scanning technologist: Memory changes, right-sided weakness, worsening over the past 2 weeks. EXAM: MRI HEAD WITHOUT CONTRAST TECHNIQUE: Multiplanar, multiecho pulse sequences of the brain and surrounding structures were obtained without intravenous contrast. COMPARISON:  Brain MRI 01/10/2019. FINDINGS: Brain: Cerebral volume is normal for age. There is a 1.5 cm lesion within the left superior frontal gyrus which is predominantly T2 hyperintense and T1 hypointense. There is corresponding restricted diffusion. Prominent surrounding vasogenic edema within the left frontal lobe and extending slightly into the corpus callosum. Associated mass effect with early rightward subfalcine herniation (series 12, image 17). 3 mm rightward midline shift at the level of the septum pellucidum. Partial effacement of the left lateral ventricle. Background mild multifocal T2/FLAIR hyperintensity within the cerebral white matter is nonspecific, but compatible with chronic small vessel ischemic disease. There is no acute infarct. No chronic intracranial blood products. No extra-axial fluid collection. No midline shift. Vascular: Expected proximal arterial flow voids. Skull and upper cervical spine: No  focal marrow lesion. Sinuses/Orbits: Visualized orbits show no acute finding. Trace ethmoid sinus mucosal thickening. These results were called by telephone at the time of interpretation on 07/31/2020 at 8:39 am to provider [place] , who verbally acknowledged these results. IMPRESSION: 1.5 cm lesion within the left superior frontal gyrus with associated restricted diffusion and prominent surrounding vasogenic edema. This is favored to reflect a metastatic lesion. However, a brain abscess cannot be excluded. Postcontrast MR imaging of the brain is recommended for further evaluation. Associated mass effect with early rightward subfalcine herniation. 3 mm rightward midline shift at the level of the septum pellucidum. Partial effacement of the left lateral ventricle. Mild cerebral white matter chronic small vessel ischemic disease. Electronically Signed   By: Kellie Simmering DO   On: 07/31/2020 08:40   MR BRAIN W CONTRAST  Result Date: 08/02/2020 CLINICAL DATA:  Brain mass or lesion. EXAM: MRI HEAD WITH CONTRAST TECHNIQUE: Multiplanar, multiecho pulse sequences of the brain and surrounding structures were obtained with intravenous contrast. CONTRAST:  29mL MULTIHANCE GADOBENATE DIMEGLUMINE 529 MG/ML IV SOLN COMPARISON:  MRI of the brain July 31, 2020 FINDINGS: Postcontrast images of the brain show a lobulated left frontal lesion with necrotic center and irregular peripheral contrast enhancement measuring approximately 2.2 x 1.8 x 1.4 cm. Sulcal contrast enhancement is also noted posterior to the lesion (series 3, image 14). Prominent surrounding edema with a 3-4 mm rightward midline shift are again noted. No other enhancing lesion identified. IMPRESSION: Rim enhancing necrotic lesion in the left frontal lobe. Differential diagnosis remains the same and favors metastatic disease. Abscess remains a consideration given restricted diffusion seen on prior MRI. Electronically Signed   By: Pedro Earls  M.D.   On: 08/02/2020 13:02   CT CHEST ABDOMEN PELVIS W CONTRAST  Result Date: 08/02/2020 CLINICAL DATA:  New brain lesion on MRI.  Weight loss. EXAM: CT CHEST, ABDOMEN, AND PELVIS WITH CONTRAST TECHNIQUE: Multidetector CT imaging of the chest, abdomen and pelvis was performed following the standard protocol during bolus administration of intravenous contrast. CONTRAST:  131mL OMNIPAQUE IOHEXOL 300 MG/ML  SOLN COMPARISON:  None. FINDINGS: CT CHEST FINDINGS Cardiovascular: Normal  heart size. No significant pericardial effusion/thickening. Left anterior descending and left circumflex coronary atherosclerosis. Atherosclerotic nonaneurysmal thoracic aorta. Normal caliber pulmonary arteries. No central pulmonary emboli. Mediastinum/Nodes: Subcentimeter hypodense right thyroid nodule. Not clinically significant; no follow-up imaging recommended (ref: J Am Coll Radiol. 2015 Feb;12(2): 143-50). Unremarkable esophagus. No pathologically enlarged axillary, mediastinal or hilar lymph nodes. Lungs/Pleura: No pneumothorax. No pleural effusion. Irregular solid 5.4 x 4.4 cm peripheral right upper lobe lung mass abutting and mildly distorting the minor fissure (series 3/image 40). Tiny solid 0.2 cm lingular pulmonary nodule (series 3/image 70). No additional significant pulmonary nodules. Mild centrilobular and paraseptal emphysema. Musculoskeletal: No aggressive appearing focal osseous lesions. Moderate thoracic spondylosis. CT ABDOMEN PELVIS FINDINGS Hepatobiliary: Normal liver with no liver mass. Cholelithiasis. Contracted gallbladder with no definite gallbladder wall thickening. No biliary ductal dilatation. Pancreas: Normal, with no mass or duct dilation. Spleen: Normal size. No mass. Adrenals/Urinary Tract: Normal adrenals. Normal kidneys with no hydronephrosis and no renal mass. Normal bladder. Stomach/Bowel: Normal non-distended stomach. Normal caliber small bowel with no small bowel wall thickening. Normal appendix.  Oral contrast transits to the colon. Normal large bowel with no diverticulosis, large bowel wall thickening or pericolonic fat stranding. Vascular/Lymphatic: Atherosclerotic abdominal aorta with ectatic 2.6 cm infrarenal abdominal aorta. Patent portal, splenic, hepatic and renal veins. No pathologically enlarged lymph nodes in the abdomen or pelvis. Reproductive: Status post hysterectomy, with no abnormal findings at the vaginal cuff. No adnexal mass. Other: No pneumoperitoneum, ascites or focal fluid collection. Musculoskeletal: No aggressive appearing focal osseous lesions. Moderate multilevel lumbar degenerative disc disease. Interbody spacers at L3-4 and L4-5. Surgical hardware between the L3-4 and L4-5 spinous processes. IMPRESSION: 1. Irregular solid 5.4 cm peripheral right upper lobe lung mass abutting and mildly distorting the minor fissure, compatible with primary bronchogenic carcinoma. Multi disciplinary thoracic oncology consultation suggested. 2. Tiny 0.2 cm lingular pulmonary nodule, for which follow-up chest CT is recommended in 3 months. 3. No additional potential findings of metastatic disease in the chest, abdomen or pelvis. No thoracic adenopathy. 4. Two-vessel coronary atherosclerosis. 5. Cholelithiasis. 6. Ectatic 2.6 cm infrarenal abdominal aorta. Recommend follow-up ultrasound every 5 years. This recommendation follows ACR consensus guidelines: White Paper of the ACR Incidental Findings Committee II on Vascular Findings. J Am Coll Radiol 2013; 10:789-794. 7. Aortic Atherosclerosis (ICD10-I70.0) and Emphysema (ICD10-J43.9). Electronically Signed   By: Ilona Sorrel M.D.   On: 08/02/2020 17:37    ASSESSMENT: This is a very pleasant 70 year old African-American female with likely stage IV bronchogenic carcinoma. No tissue diagnosis at this time. The patient presented with a right upper lobe lung mass and a solitary brain metastasis to the left frontal lobe. The patient was diagnosed in February  2022.   PLAN: The patient seen with Dr. Julien Nordmann today. Dr. Julien Nordmann had a lengthy discussion today with the patient about her current condition and required work-up. The patient still requires a staging PET scan as well as tissue confirmation to confirm the diagnosis and histological subtype of her malignancy.   We will arrange for the patient to have a CT guided biopsy of the right lung mass.  In the meantime, we will arrange for the patient to have her staging PET scan performed.  We will see the patient back for follow-up visit in approximately 2 weeks for evaluation and to the review the results of these studies. At that time, we will have a more detailed discussion about the patient's current condition and recommended treatment options  We will refer the patient  to radiation oncology regarding the brain metastasis.  I also discussed constipation education with the patient today. We also discussed increasing her caloric intake.  The patient voices understanding of current disease status and treatment options and is in agreement with the current care plan.  All questions were answered. The patient knows to call the clinic with any problems, questions or concerns. We can certainly see the patient much sooner if necessary.  Thank you so much for allowing me to participate in the care of Hannah [name]. I will continue to follow up the patient with you and assist in her care.  I spent 40-54 minutes in this encounter.   Disclaimer: This note was dictated with voice recognition software. Similar sounding words can inadvertently be transcribed and may not be corrected upon review.   Cordarrius Coad L Amana Bouska August 07, 2020, 10:33 AM   ADDENDUM: Hematology/Oncology Attending: I had a face-to-face encounter with the patient today.  I reviewed her record and scan and recommended her care plan.  This is a very pleasant 70 years old African-American female who presented to the clinic today  for evaluation accompanied by her daughter.  The patient mentions that on August 01, 2019 she presented to her primary care physician complaining of memory loss as well as right-sided weakness and slurred speech over several days.  She had MRI of the brain without contrast on July 31, 2020 and that showed 1.5 cm lesion within the left superior frontal gyrus with associated restricted diffusion and prominent surrounding vasogenic edema favored to reflect a metastatic lesion.  This was followed by MRI of the brain with contrast on 08/02/2020 and it showed the postcontrast images with a lobulated left frontal lesion with necrotic center and irregular peripheral contrast enhancement measuring approximately 2.2 x 1.8 x 1.4 cm.  There was sulcal contrast enhancement noted posterior to the lesion and prominent surrounding edema with 3-4 mm rightward midline shift and no other enhancing lesions identified.  Her primary care physician Dr. Lawerance Bach order CT scan of the chest, abdomen pelvis which was performed on August 02, 2020 and that showed irregular solid 5.4 cm peripheral right upper lobe lung mass abutting and mildly distorting the major fissure compatible with primary bronchogenic carcinoma.  There was also a tiny 0.2 cm irregular pulmonary nodule.  No additional potential findings of metastatic disease in the chest, abdomen or pelvis and no thoracic adenopathy.  The patient was seen by Dr. Barbaraann Cao with neuro-oncology and he started her on a tapered dose of Decadron and she started feeling a little bit better after starting Decadron 4 mg p.o. twice daily. The patient was referred to [place] today for evaluation and recommendation regarding her condition. I had a lengthy discussion with the patient and her daughter about her condition and further investigation to confirm a diagnosis as well as potential treatment options. This is a 70 years old white female with likely stage IV (T3, N0, M1 C) lung cancer pending tissue  diagnosis as well as further staging work-up. I recommended for the patient to have a PET scan to complete the staging work-up for her disease. I also recommended for the patient referral to IR for consideration of CT-guided core biopsy of the large peripheral lung mass for tissue diagnosis. For the metastatic brain lesion, she will continue her current treatment with Decadron and we will refer the patient to radiation oncology for consideration of SRS to the solitary brain metastasis. I will arrange for the patient to come back  for follow-up visit in around 2 weeks for more detailed discussion of her treatment options based on the final staging work-up and tissue diagnosis. If the final pathology is consistent with non-small cell lung cancer, adenocarcinoma, we will send a tissue block for molecular studies and PD-L1 expression. The patient was advised to call immediately if she has any other concerning symptoms in the interval.  Disclaimer: This note was dictated with voice recognition software. Similar sounding words can inadvertently be transcribed and may be missed upon review. Eilleen Kempf, MD 08/07/20

## 2020-08-06 ENCOUNTER — Other Ambulatory Visit: Payer: Self-pay

## 2020-08-06 ENCOUNTER — Inpatient Hospital Stay: Payer: Medicare HMO

## 2020-08-06 ENCOUNTER — Inpatient Hospital Stay: Payer: Medicare HMO | Attending: Internal Medicine | Admitting: Internal Medicine

## 2020-08-06 ENCOUNTER — Other Ambulatory Visit: Payer: Self-pay | Admitting: Physician Assistant

## 2020-08-06 DIAGNOSIS — K219 Gastro-esophageal reflux disease without esophagitis: Secondary | ICD-10-CM | POA: Diagnosis not present

## 2020-08-06 DIAGNOSIS — M5136 Other intervertebral disc degeneration, lumbar region: Secondary | ICD-10-CM | POA: Diagnosis not present

## 2020-08-06 DIAGNOSIS — Z7952 Long term (current) use of systemic steroids: Secondary | ICD-10-CM | POA: Insufficient documentation

## 2020-08-06 DIAGNOSIS — I10 Essential (primary) hypertension: Secondary | ICD-10-CM

## 2020-08-06 DIAGNOSIS — I700 Atherosclerosis of aorta: Secondary | ICD-10-CM | POA: Insufficient documentation

## 2020-08-06 DIAGNOSIS — Z888 Allergy status to other drugs, medicaments and biological substances status: Secondary | ICD-10-CM | POA: Insufficient documentation

## 2020-08-06 DIAGNOSIS — Z8719 Personal history of other diseases of the digestive system: Secondary | ICD-10-CM | POA: Insufficient documentation

## 2020-08-06 DIAGNOSIS — Z833 Family history of diabetes mellitus: Secondary | ICD-10-CM | POA: Insufficient documentation

## 2020-08-06 DIAGNOSIS — Z79899 Other long term (current) drug therapy: Secondary | ICD-10-CM | POA: Diagnosis not present

## 2020-08-06 DIAGNOSIS — R413 Other amnesia: Secondary | ICD-10-CM | POA: Insufficient documentation

## 2020-08-06 DIAGNOSIS — E785 Hyperlipidemia, unspecified: Secondary | ICD-10-CM

## 2020-08-06 DIAGNOSIS — R911 Solitary pulmonary nodule: Secondary | ICD-10-CM | POA: Diagnosis not present

## 2020-08-06 DIAGNOSIS — I6782 Cerebral ischemia: Secondary | ICD-10-CM

## 2020-08-06 DIAGNOSIS — Z809 Family history of malignant neoplasm, unspecified: Secondary | ICD-10-CM

## 2020-08-06 DIAGNOSIS — C7931 Secondary malignant neoplasm of brain: Secondary | ICD-10-CM | POA: Insufficient documentation

## 2020-08-06 DIAGNOSIS — F419 Anxiety disorder, unspecified: Secondary | ICD-10-CM | POA: Insufficient documentation

## 2020-08-06 DIAGNOSIS — R531 Weakness: Secondary | ICD-10-CM | POA: Insufficient documentation

## 2020-08-06 DIAGNOSIS — C3411 Malignant neoplasm of upper lobe, right bronchus or lung: Secondary | ICD-10-CM | POA: Diagnosis not present

## 2020-08-06 DIAGNOSIS — J439 Emphysema, unspecified: Secondary | ICD-10-CM | POA: Diagnosis not present

## 2020-08-06 DIAGNOSIS — R634 Abnormal weight loss: Secondary | ICD-10-CM | POA: Diagnosis not present

## 2020-08-06 DIAGNOSIS — C801 Malignant (primary) neoplasm, unspecified: Secondary | ICD-10-CM | POA: Diagnosis not present

## 2020-08-06 DIAGNOSIS — Z9889 Other specified postprocedural states: Secondary | ICD-10-CM | POA: Diagnosis not present

## 2020-08-06 DIAGNOSIS — M47814 Spondylosis without myelopathy or radiculopathy, thoracic region: Secondary | ICD-10-CM | POA: Insufficient documentation

## 2020-08-06 DIAGNOSIS — K8020 Calculus of gallbladder without cholecystitis without obstruction: Secondary | ICD-10-CM | POA: Insufficient documentation

## 2020-08-06 DIAGNOSIS — F32A Depression, unspecified: Secondary | ICD-10-CM | POA: Insufficient documentation

## 2020-08-06 DIAGNOSIS — Z8042 Family history of malignant neoplasm of prostate: Secondary | ICD-10-CM | POA: Insufficient documentation

## 2020-08-06 DIAGNOSIS — F17210 Nicotine dependence, cigarettes, uncomplicated: Secondary | ICD-10-CM | POA: Diagnosis not present

## 2020-08-06 DIAGNOSIS — Z823 Family history of stroke: Secondary | ICD-10-CM

## 2020-08-06 DIAGNOSIS — C3490 Malignant neoplasm of unspecified part of unspecified bronchus or lung: Secondary | ICD-10-CM | POA: Insufficient documentation

## 2020-08-06 DIAGNOSIS — Z8379 Family history of other diseases of the digestive system: Secondary | ICD-10-CM | POA: Insufficient documentation

## 2020-08-06 MED ORDER — DEXAMETHASONE 4 MG PO TABS: 4.0000 mg | ORAL_TABLET | Freq: Every day | ORAL | 0 refills | Status: DC

## 2020-08-06 NOTE — Progress Notes (Signed)
[facility], [place] [address] [phone]   New Patient Evaluation  Date of Service: 08/06/20 Patient Name: Hannah [name] Patient MRN: [id number] Patient DOB: [birth year] Provider: Ventura Sellers, MD  Identifying Statement:  Hannah [name] is a 70 y.o. female with brain metastasis who presents for initial consultation and evaluation regarding cancer associated neurologic deficits.    Referring Provider: [name], MD [place],  [place] [address]  Primary Cancer: Lung NOS  Oncologic History 08/02/20: Presents with dyshpasia, right sided weakness; MRI demonstrates L frontal mass  History of Present Illness: The patient's records from the referring physician were obtained and reviewed and the patient interviewed to confirm this HPI.  Hannah [name] presented to medical attention this past week with several days rapidly progressive speech impairment and right sided weakness.  She and her family describe difficulty putting complete sentences together, impaired use of right arm (for using fork, zipper, brushing teeth), and dragging of the right leg while walking.  She never needed assistance to walk.  CNS imaging demonstrated left frontal mass, and decadron was started over the weekend 4mg  twice per day.  She feels improved with regards to the right sided weakness with the steroids.  Otherwise no seizures, headaches.  Medications: Current Outpatient Medications on File Prior to Visit  Medication Sig Dispense Refill  . ALPRAZolam (XANAX) 0.5 MG tablet Take 1 tablet (0.5 mg total) by mouth 2 (two) times daily as needed. for sleep 60 tablet 5  . Ascorbic Acid (VITAMIN C PO) Take 1,000 mg by mouth daily.     Marland Kitchen aspirin 81 MG tablet Take 1 tablet (81 mg total) by mouth daily. 30 tablet   . bisoprolol-hydrochlorothiazide (ZIAC) 5-6.25 MG tablet TAKE 1 TABLET EVERY DAY 90 tablet 1  . Calcium Carbonate (CALCIUM  600 PO) Take 1,200 mg by mouth every other day.     . cholecalciferol (VITAMIN D) 1000 UNITS tablet Take 1 tablet (1,000 Units total) by mouth daily. 30 tablet 5  . dexamethasone (DECADRON) 4 MG tablet Take 1 tablet (4 mg total) by mouth 2 (two) times daily. Take with food. 30 tablet 0  . fenofibrate 160 MG tablet Take 1 tablet (160 mg total) by mouth daily. 90 tablet 3  . Glycerin-Hypromellose-PEG 400 ([employer] DRY EYE RELIEF) 0.2-0.2-1 % SOLN Place 1 drop into both eyes 2 (two) times a day.    . mirtazapine (REMERON) 15 MG tablet Take 0.5 tablets (7.5 mg total) by mouth at bedtime. 15 tablet 3  . naphazoline-glycerin (CLEAR EYES REDNESS) 0.012-0.2 % SOLN Place 1-2 drops into both eyes 4 (four) times daily as needed for eye irritation. bausch and lomb    . omeprazole (PRILOSEC) 40 MG capsule TAKE 1 CAPSULE TWICE DAILY BEFORE MEALS 180 capsule 1  . pravastatin (PRAVACHOL) 40 MG tablet TAKE 1 TABLET EVERY EVENING 90 tablet 1  . Probiotic Product (PROBIOTIC DAILY PO) Take 1 capsule by mouth daily.     No current facility-administered medications on file prior to visit.    Allergies:  Allergies  Allergen Reactions  . Amlodipine     Rash, swelling  . Chantix [Varenicline Tartrate] Other (See Comments)    Tongue swell,sob  . Clarithromycin Rash  . Lisinopril Hives  . Simvastatin Hives  . Wellbutrin [Bupropion Hcl] Hives  . Lipitor [Atorvastatin]     Dizziness per patient  . Sertraline     Makes her  feel like she is going to kill someone   Past Medical History:  Past Medical History:  Diagnosis Date  . Allergy   . Anemia   . Anxiety   . Arthritis   . Carpal tunnel syndrome   . Constipation    senna C stool softeners help   . Depression   . Diverticulosis   . Dyslipidemia   . External hemorrhoids   . GERD (gastroesophageal reflux disease)   . Heart murmur    mild-moderate AR  . Hiatal hernia   . Hyperlipidemia    on meds   . Hypertension   . Internal hemorrhoids   .  Osteoarthritis   . Pre-diabetes   . PVD (peripheral vascular disease) (HCC)    moderate carotid disease  . RBBB   . Smoker   . Tubular adenoma of colon   . Vocal cord polyps    Past Surgical History:  Past Surgical History:  Procedure Laterality Date  . ABDOMINAL HYSTERECTOMY  1994  . COLONOSCOPY    . COLONOSCOPY W/ POLYPECTOMY  2009  . HEMORRHOID SURGERY    . JOINT REPLACEMENT    . POLYPECTOMY    . right total knee arthroplasty     Dr. Emeterio Reeve 06-04-18  . SPINE SURGERY  08/16/2009  . TOTAL KNEE ARTHROPLASTY Left 02/13/2014   Procedure: TOTAL KNEE ARTHROPLASTY;  Surgeon: Alta Corning, MD;  Location: [facility];  Service: Orthopedics;  Laterality: Left;  . TOTAL KNEE ARTHROPLASTY Right 06/04/2018   Procedure: RIGHT TOTAL KNEE ARTHROPLASTY;  Surgeon: Dorna Leitz, MD;  Location: [facility];  Service: Orthopedics;  Laterality: Right;  Adductor Block  . TUBAL LIGATION     Social History:  Social History   Socioeconomic History  . Marital status: Married    Spouse name: Not on file  . Number of children: 2  . Years of education: Not on file  . Highest education level: Not on file  Occupational History  . Not on file  Tobacco Use  . Smoking status: Light Tobacco Smoker    Packs/day: 0.25    Years: 40.00    Pack years: 10.00    Types: Cigarettes  . Smokeless tobacco: Never Used  . Tobacco comment: PACK WILL LAST 3 days  Vaping Use  . Vaping Use: Never used  Substance and Sexual Activity  . Alcohol use: No  . Drug use: No  . Sexual activity: Not Currently  Other Topics Concern  . Not on file  Social History Narrative  . Not on file   Social Determinants of Health   Financial Resource Strain: Low Risk   . Difficulty of Paying Living Expenses: Not hard at all  Food Insecurity: No Food Insecurity  . Worried About Charity fundraiser in the Last Year: Never true  . Ran Out of Food in the Last Year: Never true  Transportation Needs: No Transportation Needs  . Lack of  Transportation (Medical): No  . Lack of Transportation (Non-Medical): No  Physical Activity: Sufficiently Active  . Days of Exercise per Week: 4 days  . Minutes of Exercise per Session: 50 min  Stress: No Stress Concern Present  . Feeling of Stress : Not at all  Social Connections: Socially Integrated  . Frequency of Communication with Friends and Family: More than three times a week  . Frequency of Social Gatherings with Friends and Family: More than three times a week  . Attends Religious Services: More than 4 times per year  .  Active Member of Clubs or Organizations: Yes  . Attends Archivist Meetings: More than 4 times per year  . Marital Status: Married  Human resources officer Violence: Not on file   Family History:  Family History  Problem Relation Age of Onset  . Cancer Father   . Prostate cancer Father   . Diabetes Sister   . Stroke Maternal Grandfather   . Colitis Maternal Aunt   . Colon cancer Neg Hx   . Colon polyps Neg Hx   . Rectal cancer Neg Hx   . Stomach cancer Neg Hx   . Esophageal cancer Neg Hx     Review of Systems: Constitutional: Doesn't report fevers, chills or abnormal weight loss Eyes: Doesn't report blurriness of vision Ears, nose, mouth, throat, and face: Doesn't report sore throat Respiratory: Doesn't report cough, dyspnea or wheezes Cardiovascular: Doesn't report palpitation, chest discomfort  Gastrointestinal:  Doesn't report nausea, constipation, diarrhea GU: Doesn't report incontinence Skin: Doesn't report skin rashes Neurological: Per HPI Musculoskeletal: Doesn't report joint pain Behavioral/Psych: Doesn't report anxiety  Physical Exam: Vitals:   08/06/20 0955  BP: 134/64  Pulse: (!) 45  Resp: 16  Temp: 98.2 F (36.8 C)  SpO2: 100%   KPS: 70. General: Alert, cooperative, pleasant, in no acute distress Head: Normal EENT: No conjunctival injection or scleral icterus.  Lungs: Resp effort normal Cardiac: Regular rate Abdomen:  Non-distended abdomen Skin: No rashes cyanosis or petechiae. Extremities: No clubbing or edema  Neurologic Exam: Mental Status: Awake, alert, attentive to examiner. Oriented to self and environment. Language is impaired with regards to fluency, with intact comprehension.  Cranial Nerves: Visual acuity is grossly normal. Visual fields are full. Extra-ocular movements intact. No ptosis. Face is symmetric Motor: Tone and bulk are normal. Power is 4/5 in right arm and 4+/5 in R leg. Reflexes are symmetric, no pathologic reflexes present.  Sensory: Intact to light touch Gait: Hemiparetic but independent  Labs: I have reviewed the data as listed    Component Value Date/Time   NA 142 07/24/2020 0846   K 4.0 07/24/2020 0846   CL 105 07/24/2020 0846   CO2 32 07/24/2020 0846   GLUCOSE 100 (H) 07/24/2020 0846   BUN 15 07/24/2020 0846   CREATININE 0.69 07/24/2020 0846   CREATININE 0.79 03/14/2020 1016   CALCIUM 10.5 07/24/2020 0846   PROT 7.3 07/24/2020 0846   ALBUMIN 4.3 07/24/2020 0846   AST 18 07/24/2020 0846   ALT 11 07/24/2020 0846   ALKPHOS 37 (L) 07/24/2020 0846   BILITOT 0.4 07/24/2020 0846   GFRNONAA 75 03/14/2020 1016   GFRAA 87 03/14/2020 1016   Lab Results  Component Value Date   WBC 7.3 07/24/2020   NEUTROABS 4.3 07/24/2020   HGB 10.8 (L) 07/24/2020   HCT 33.8 (L) 07/24/2020   MCV 81.5 07/24/2020   PLT 322.0 07/24/2020    Imaging:  MR Brain Wo Contrast  Result Date: 07/31/2020 CLINICAL DATA:  Memory changes. Memory loss. Additional history provided by scanning technologist: Memory changes, right-sided weakness, worsening over the past 2 weeks. EXAM: MRI HEAD WITHOUT CONTRAST TECHNIQUE: Multiplanar, multiecho pulse sequences of the brain and surrounding structures were obtained without intravenous contrast. COMPARISON:  Brain MRI 01/10/2019. FINDINGS: Brain: Cerebral volume is normal for age. There is a 1.5 cm lesion within the left superior frontal gyrus which is  predominantly T2 hyperintense and T1 hypointense. There is corresponding restricted diffusion. Prominent surrounding vasogenic edema within the left frontal lobe and extending slightly into the  corpus callosum. Associated mass effect with early rightward subfalcine herniation (series 12, image 17). 3 mm rightward midline shift at the level of the septum pellucidum. Partial effacement of the left lateral ventricle. Background mild multifocal T2/FLAIR hyperintensity within the cerebral white matter is nonspecific, but compatible with chronic small vessel ischemic disease. There is no acute infarct. No chronic intracranial blood products. No extra-axial fluid collection. No midline shift. Vascular: Expected proximal arterial flow voids. Skull and upper cervical spine: No focal marrow lesion. Sinuses/Orbits: Visualized orbits show no acute finding. Trace ethmoid sinus mucosal thickening. These results were called by telephone at the time of interpretation on 07/31/2020 at 8:39 am to provider [place] , who verbally acknowledged these results. IMPRESSION: 1.5 cm lesion within the left superior frontal gyrus with associated restricted diffusion and prominent surrounding vasogenic edema. This is favored to reflect a metastatic lesion. However, a brain abscess cannot be excluded. Postcontrast MR imaging of the brain is recommended for further evaluation. Associated mass effect with early rightward subfalcine herniation. 3 mm rightward midline shift at the level of the septum pellucidum. Partial effacement of the left lateral ventricle. Mild cerebral white matter chronic small vessel ischemic disease. Electronically Signed   By: Kellie Simmering DO   On: 07/31/2020 08:40   MR BRAIN W CONTRAST  Result Date: 08/02/2020 CLINICAL DATA:  Brain mass or lesion. EXAM: MRI HEAD WITH CONTRAST TECHNIQUE: Multiplanar, multiecho pulse sequences of the brain and surrounding structures were obtained with intravenous contrast. CONTRAST:   14mL MULTIHANCE GADOBENATE DIMEGLUMINE 529 MG/ML IV SOLN COMPARISON:  MRI of the brain July 31, 2020 FINDINGS: Postcontrast images of the brain show a lobulated left frontal lesion with necrotic center and irregular peripheral contrast enhancement measuring approximately 2.2 x 1.8 x 1.4 cm. Sulcal contrast enhancement is also noted posterior to the lesion (series 3, image 14). Prominent surrounding edema with a 3-4 mm rightward midline shift are again noted. No other enhancing lesion identified. IMPRESSION: Rim enhancing necrotic lesion in the left frontal lobe. Differential diagnosis remains the same and favors metastatic disease. Abscess remains a consideration given restricted diffusion seen on prior MRI. Electronically Signed   By: Pedro Earls M.D.   On: 08/02/2020 13:02   CT CHEST ABDOMEN PELVIS W CONTRAST  Result Date: 08/02/2020 CLINICAL DATA:  New brain lesion on MRI.  Weight loss. EXAM: CT CHEST, ABDOMEN, AND PELVIS WITH CONTRAST TECHNIQUE: Multidetector CT imaging of the chest, abdomen and pelvis was performed following the standard protocol during bolus administration of intravenous contrast. CONTRAST:  124mL OMNIPAQUE IOHEXOL 300 MG/ML  SOLN COMPARISON:  None. FINDINGS: CT CHEST FINDINGS Cardiovascular: Normal heart size. No significant pericardial effusion/thickening. Left anterior descending and left circumflex coronary atherosclerosis. Atherosclerotic nonaneurysmal thoracic aorta. Normal caliber pulmonary arteries. No central pulmonary emboli. Mediastinum/Nodes: Subcentimeter hypodense right thyroid nodule. Not clinically significant; no follow-up imaging recommended (ref: J Am Coll Radiol. 2015 Feb;12(2): 143-50). Unremarkable esophagus. No pathologically enlarged axillary, mediastinal or hilar lymph nodes. Lungs/Pleura: No pneumothorax. No pleural effusion. Irregular solid 5.4 x 4.4 cm peripheral right upper lobe lung mass abutting and mildly distorting the minor fissure  (series 3/image 40). Tiny solid 0.2 cm lingular pulmonary nodule (series 3/image 70). No additional significant pulmonary nodules. Mild centrilobular and paraseptal emphysema. Musculoskeletal: No aggressive appearing focal osseous lesions. Moderate thoracic spondylosis. CT ABDOMEN PELVIS FINDINGS Hepatobiliary: Normal liver with no liver mass. Cholelithiasis. Contracted gallbladder with no definite gallbladder wall thickening. No biliary ductal dilatation. Pancreas: Normal, with no mass or  duct dilation. Spleen: Normal size. No mass. Adrenals/Urinary Tract: Normal adrenals. Normal kidneys with no hydronephrosis and no renal mass. Normal bladder. Stomach/Bowel: Normal non-distended stomach. Normal caliber small bowel with no small bowel wall thickening. Normal appendix. Oral contrast transits to the colon. Normal large bowel with no diverticulosis, large bowel wall thickening or pericolonic fat stranding. Vascular/Lymphatic: Atherosclerotic abdominal aorta with ectatic 2.6 cm infrarenal abdominal aorta. Patent portal, splenic, hepatic and renal veins. No pathologically enlarged lymph nodes in the abdomen or pelvis. Reproductive: Status post hysterectomy, with no abnormal findings at the vaginal cuff. No adnexal mass. Other: No pneumoperitoneum, ascites or focal fluid collection. Musculoskeletal: No aggressive appearing focal osseous lesions. Moderate multilevel lumbar degenerative disc disease. Interbody spacers at L3-4 and L4-5. Surgical hardware between the L3-4 and L4-5 spinous processes. IMPRESSION: 1. Irregular solid 5.4 cm peripheral right upper lobe lung mass abutting and mildly distorting the minor fissure, compatible with primary bronchogenic carcinoma. Multi disciplinary thoracic oncology consultation suggested. 2. Tiny 0.2 cm lingular pulmonary nodule, for which follow-up chest CT is recommended in 3 months. 3. No additional potential findings of metastatic disease in the chest, abdomen or pelvis. No  thoracic adenopathy. 4. Two-vessel coronary atherosclerosis. 5. Cholelithiasis. 6. Ectatic 2.6 cm infrarenal abdominal aorta. Recommend follow-up ultrasound every 5 years. This recommendation follows ACR consensus guidelines: White Paper of the ACR Incidental Findings Committee II on Vascular Findings. J Am Coll Radiol 2013; 10:789-794. 7. Aortic Atherosclerosis (ICD10-I70.0) and Emphysema (ICD10-J43.9). Electronically Signed   By: Ilona Sorrel M.D.   On: 08/02/2020 17:37     Assessment/Plan Left Frontal Brain Metastasis  Hannah [name] presents today with clinical and radiographic syndrome localizing to the left frontal lobe, etiology is oligometastasis secondary to primary lung cancer.  We counseled her and her family regarding cancer diagnosis and treatment pathways available for either NSCLC or small cell.  They understand this will depend fully on histology from lung mass biopsy.  Plan of care will likely include either localized pre-operative SRS and craniotomy vs WBRT.  Motor function improved on 8mg  daily decadron; we recommended dropping to 4mg  daily starting on 08/08/20.  We will give her a call next week to continue steroid titration based on clinical status.  We spent twenty additional minutes teaching regarding the natural history, biology, and historical experience in the treatment of neurologic complications of cancer.   We appreciate the opportunity to participate in the care of Hannah [name].  She will continue to follow up with [place] as next steps are reached regarding lung mass and brain tumor radiation/surgery.  All questions were answered. The patient knows to call the clinic with any problems, questions or concerns. No barriers to learning were detected.  The total time spent in the encounter was 45 minutes and more than 50% was on counseling and review of test results   Ventura Sellers, MD Medical Director of Neuro-Oncology [facility] 08/06/20 10:41 AM

## 2020-08-07 ENCOUNTER — Encounter (HOSPITAL_COMMUNITY): Payer: Self-pay | Admitting: Radiology

## 2020-08-07 ENCOUNTER — Telehealth: Payer: Self-pay | Admitting: Internal Medicine

## 2020-08-07 ENCOUNTER — Inpatient Hospital Stay: Payer: Medicare HMO | Admitting: Physician Assistant

## 2020-08-07 ENCOUNTER — Other Ambulatory Visit: Payer: Self-pay

## 2020-08-07 ENCOUNTER — Inpatient Hospital Stay: Payer: Medicare HMO

## 2020-08-07 ENCOUNTER — Other Ambulatory Visit: Payer: Self-pay | Admitting: Neurological Surgery

## 2020-08-07 ENCOUNTER — Other Ambulatory Visit: Payer: Self-pay | Admitting: Radiation Therapy

## 2020-08-07 VITALS — BP 143/67 | HR 41 | Temp 98.7°F | Resp 19 | Ht 60.0 in | Wt 106.3 lb

## 2020-08-07 DIAGNOSIS — K219 Gastro-esophageal reflux disease without esophagitis: Secondary | ICD-10-CM

## 2020-08-07 DIAGNOSIS — Z8042 Family history of malignant neoplasm of prostate: Secondary | ICD-10-CM

## 2020-08-07 DIAGNOSIS — J439 Emphysema, unspecified: Secondary | ICD-10-CM | POA: Diagnosis not present

## 2020-08-07 DIAGNOSIS — F419 Anxiety disorder, unspecified: Secondary | ICD-10-CM

## 2020-08-07 DIAGNOSIS — R911 Solitary pulmonary nodule: Secondary | ICD-10-CM | POA: Diagnosis not present

## 2020-08-07 DIAGNOSIS — C7931 Secondary malignant neoplasm of brain: Secondary | ICD-10-CM

## 2020-08-07 DIAGNOSIS — M1990 Unspecified osteoarthritis, unspecified site: Secondary | ICD-10-CM | POA: Diagnosis not present

## 2020-08-07 DIAGNOSIS — E785 Hyperlipidemia, unspecified: Secondary | ICD-10-CM

## 2020-08-07 DIAGNOSIS — Z8379 Family history of other diseases of the digestive system: Secondary | ICD-10-CM

## 2020-08-07 DIAGNOSIS — I10 Essential (primary) hypertension: Secondary | ICD-10-CM

## 2020-08-07 DIAGNOSIS — E119 Type 2 diabetes mellitus without complications: Secondary | ICD-10-CM

## 2020-08-07 DIAGNOSIS — R011 Cardiac murmur, unspecified: Secondary | ICD-10-CM

## 2020-08-07 DIAGNOSIS — C3411 Malignant neoplasm of upper lobe, right bronchus or lung: Secondary | ICD-10-CM | POA: Insufficient documentation

## 2020-08-07 DIAGNOSIS — Z823 Family history of stroke: Secondary | ICD-10-CM

## 2020-08-07 DIAGNOSIS — R918 Other nonspecific abnormal finding of lung field: Secondary | ICD-10-CM

## 2020-08-07 DIAGNOSIS — Z833 Family history of diabetes mellitus: Secondary | ICD-10-CM

## 2020-08-07 DIAGNOSIS — I700 Atherosclerosis of aorta: Secondary | ICD-10-CM | POA: Diagnosis not present

## 2020-08-07 DIAGNOSIS — K8020 Calculus of gallbladder without cholecystitis without obstruction: Secondary | ICD-10-CM | POA: Diagnosis not present

## 2020-08-07 DIAGNOSIS — R413 Other amnesia: Secondary | ICD-10-CM | POA: Diagnosis not present

## 2020-08-07 DIAGNOSIS — C3490 Malignant neoplasm of unspecified part of unspecified bronchus or lung: Secondary | ICD-10-CM

## 2020-08-07 DIAGNOSIS — I6782 Cerebral ischemia: Secondary | ICD-10-CM | POA: Diagnosis not present

## 2020-08-07 DIAGNOSIS — F17210 Nicotine dependence, cigarettes, uncomplicated: Secondary | ICD-10-CM

## 2020-08-07 DIAGNOSIS — Z79899 Other long term (current) drug therapy: Secondary | ICD-10-CM

## 2020-08-07 DIAGNOSIS — R531 Weakness: Secondary | ICD-10-CM | POA: Diagnosis not present

## 2020-08-07 DIAGNOSIS — K648 Other hemorrhoids: Secondary | ICD-10-CM

## 2020-08-07 DIAGNOSIS — Z809 Family history of malignant neoplasm, unspecified: Secondary | ICD-10-CM

## 2020-08-07 DIAGNOSIS — F32A Depression, unspecified: Secondary | ICD-10-CM

## 2020-08-07 LAB — CMP (CANCER CENTER ONLY)
ALT: 11 U/L (ref 0–44)
AST: 17 U/L (ref 15–41)
Albumin: 4.1 g/dL (ref 3.5–5.0)
Alkaline Phosphatase: 42 U/L (ref 38–126)
Anion gap: 7 (ref 5–15)
BUN: 26 mg/dL — ABNORMAL HIGH (ref 8–23)
CO2: 29 mmol/L (ref 22–32)
Calcium: 10.4 mg/dL — ABNORMAL HIGH (ref 8.9–10.3)
Chloride: 101 mmol/L (ref 98–111)
Creatinine: 0.92 mg/dL (ref 0.44–1.00)
GFR, Estimated: >60 mL/min (ref >60)
Glucose, Bld: 109 mg/dL — ABNORMAL HIGH (ref 70–99)
Potassium: 4.5 mmol/L (ref 3.5–5.1)
Sodium: 137 mmol/L (ref 135–145)
Total Bilirubin: 0.3 mg/dL (ref 0.3–1.2)
Total Protein: 7.7 g/dL (ref 6.5–8.1)

## 2020-08-07 LAB — CBC WITH DIFFERENTIAL (CANCER CENTER ONLY)
Abs Immature Granulocytes: 0.04 10*3/uL (ref 0.00–0.07)
Basophils Absolute: 0 10*3/uL (ref 0.0–0.1)
Basophils Relative: 0 %
Eosinophils Absolute: 0 10*3/uL (ref 0.0–0.5)
Eosinophils Relative: 0 %
HCT: 39.3 % (ref 36.0–46.0)
Hemoglobin: 12.1 g/dL (ref 12.0–15.0)
Immature Granulocytes: 0 %
Lymphocytes Relative: 19 %
Lymphs Abs: 1.7 10*3/uL (ref 0.7–4.0)
MCH: 25.7 pg — ABNORMAL LOW (ref 26.0–34.0)
MCHC: 30.8 g/dL (ref 30.0–36.0)
MCV: 83.6 fL (ref 80.0–100.0)
Monocytes Absolute: 0.6 10*3/uL (ref 0.1–1.0)
Monocytes Relative: 6 %
Neutro Abs: 6.8 10*3/uL (ref 1.7–7.7)
Neutrophils Relative %: 75 %
Platelet Count: 312 10*3/uL (ref 150–400)
RBC: 4.7 MIL/uL (ref 3.87–5.11)
RDW: 12.6 % (ref 11.5–15.5)
WBC Count: 9.1 10*3/uL (ref 4.0–10.5)
nRBC: 0 % (ref 0.0–0.2)

## 2020-08-07 NOTE — Telephone Encounter (Signed)
Yes, ok 

## 2020-08-07 NOTE — Telephone Encounter (Signed)
Yes it could be.  She is on a blood pressure medication-1 pill that contains 2 medications.  One of the medications can lower her heart rate.  What has her blood pressure been?.  We may need to revise the medication.

## 2020-08-07 NOTE — Progress Notes (Signed)
Brieann N. Raver Female, 70 y.o., 1950/11/16  MRN:  [id number] Phone:  [phone] [name])       PCP:  [name], MD Coverage:  [facility] Medicare/[employer] Medicare Hmo  Next Appt With Radiology ([facility] PET) 08/08/2020 at 10:30 AM           RE: CT Lung Biopsy Received: Today Criselda Peaches, MD  [name] D  Yes, I agreed to do her as an exception due to brain mets and need for diagnosis ASAP.   HKM        Previous Messages   ----- Message -----  From: [name] D  Sent: 08/07/2020  2:38 PM EST  To: Criselda Peaches, MD  Subject: CT Lung Biopsy                  Hi Dr. Laurence Ferrari I just received and email from Debbra Riding from [place] at [facility]. I was told that you approved a Lung Biopsy at [facility]. I just want to make sure before I call short stay to schedule, since I was asked to do so. Thanks Aniceto Boss

## 2020-08-07 NOTE — Telephone Encounter (Signed)
Patients daughter calling, states that the past two days the patient has went to the oncologist and her BP was good but her HR was low so they wanted her to follow up with her PCP. Offered in office visit to discuss this and they were wondering if it could be virtual because they have a way to check the patients BP and HR at home.  [phone]

## 2020-08-07 NOTE — Patient Instructions (Addendum)
-  It was nice meeting you today.  -We still need to complete part of the staging workup. We need a sample (biopsy) of the spot in the lung so we know the name of the cancer if this proves to be lung cancer. The treatment depends on the name of the cancer. We will arrange for a CT guided biopsy of the lung. The spot is close to the surface of the lung. So what they will do, is they will use a needle from the outside and go into the lung for a sample -We also need a PET scan. This helps [place] look from the head to the knees just to make sure we know all the sites that maybe or may not be involved. We want to make sure we are not missing any spots. Someone from radiology scheduling should call you about this. If you need to reach them, their number is [phone]. If you need to call, just tell them your name and date of birth and that you are calling to schedule the scan.  -I placed a referral to radiation oncology. They are located in the basement of this building. They will likely contact you soon about setting up a consultation with one of the radiation doctors down there. They will review your records and come up with the best plan to treat the spot in the brain -We will see you back in about 10-14 days to review all of these results. We will have a better idea what the treatment plan is depending on these results. If you need to reach [place] at anytime, please call us at [phone]. Ask to talk to [facility] or Dr. Worthy Flank nurse.

## 2020-08-07 NOTE — Progress Notes (Signed)
Hannah [name] Female, 70 y.o., 03-Mar-1951  MRN:  [id number] Phone:  [phone] [name])       PCP:  [name], MD Coverage:  [facility] Medicare/[employer] Medicare Hmo  Next Appt With Radiology ([facility] PET) 08/16/2020 at 12:00 PM           RE: CT Biopsy Received: Today [name], MD  [name]  Ok to schedule CT guided right lung mass biopsy. However, please schedule after the PET CT. Would like to have PET CT before the biopsy if possible.   [name]        Previous Messages   ----- Message -----  From: [name] D  Sent: 08/07/2020 10:37 AM EST  To: Ir Procedure Requests  Subject: CT Biopsy                     Procedure:  CT Biopsy   Reason: Lung mass, Right Upper lobe Lung mass, suspicious lung cancer   History: CT in computer, NM PET to be scheduled   Provider: Gracelyn Nurse   Provider Contact: [phone]

## 2020-08-07 NOTE — Progress Notes (Signed)
[place]         [phone] ________________________________  Initial outpatient Consultation  Name: Hannah [name] MRN: [id number]  Date: 08/09/2020  DOB: 1951-06-06  REFERRING PHYSICIAN: Ventura Sellers, MD  DIAGNOSIS: 70 yo woman with a 2.2 cm left frontal brain metastasis from putative T2b (5.4 cm) N0 M1b (isolated solitary brain) primary lung cancer, pending biopsy and staging.    ICD-10-CM   1. Brain metastasis ([place])  C79.31   2. Lung mass  R91.8     HISTORY OF PRESENT ILLNESS::Hannah [name] is a 70 y.o. female who presented with several days rapidly progressive speech impairment and right sided weakness.  She and her family describe difficulty putting complete sentences together, impaired use of right arm, and dragging of the right leg while walking.  Her PCP, Dr. Quay Burow ordered imaging, initially with a noncontrast brain MRI performed on 07/31/2020 which showed a 1.5 cm left frontal lobe lesion with prominent surrounding edema and mass-effect with a 3 mm rightward midline shift.  Brain MRI with contrast was performed for further evaluation on 08/02/20 and demonstrated a lobulated left frontal lesion with necrotic center and irregular peripheral contrast enhancement measuring approximately 2.2 x 1.8 x 1.4 cm, with surrounding edema and 3 to 4 mm rightward midline shift.  No additional brain lesions were noted.     Decadron was started over the weekend at $RemoveBe'4mg'SDensilma$  twice per day.  She feels improved with regards to the right sided weakness and speech is gradually improving as well since starting the steroids. She met with Dr. Mickeal Skinner on 08/06/20 and the Decadron dose was lowered to $RemoveBe'4mg'ezEhkOjqD$  daily, starting on 08/08/20, which she is tolerating well.   CT C/A/P was obtained on 08/02/20 to assess for a potential primary source of the apparent brain metastasis since she had no prior history of cancer to her knowledge.  The chest imaging revealed an irregular, solid 5.4 x 4.4 cm  peripheral right upper lobe lung mass, consistent with bronchogenic carcinoma.  There were no enlarged mediastinal nodes or other evidence of metastatic cancer in the chest, abdomen or pelvis.    The patient underwent PET scan on 08/08/20 which confirmed the hypermetabolic mass in the right upper lobe but no other sites of active disease.  She met with Dr. Julien Nordmann in medical oncology on 08/07/20 and is scheduled for CT-Biopsy of the right upper lobe lung mass on 08/10/20 for tissue confirmation. They will meet back with him on 08/21/20 to discuss results and further treatment recommendations at that time.  She has kindly been referred today to discuss treatment options related to radiation therapy for her brain metastasis.   PREVIOUS RADIATION THERAPY: No  Past Medical History:  Diagnosis Date  . Allergy   . Anemia   . Anxiety   . Arthritis   . Carpal tunnel syndrome   . Constipation    senna C stool softeners help   . Depression   . Diverticulosis   . Dyslipidemia   . External hemorrhoids   . GERD (gastroesophageal reflux disease)   . Heart murmur    mild-moderate AR  . Hiatal hernia   . Hyperlipidemia    on meds   . Hypertension   . Internal hemorrhoids   . Osteoarthritis   . Pre-diabetes   . PVD (peripheral vascular disease) (HCC)    moderate carotid disease  . RBBB   . Smoker   . Tubular adenoma of colon   . Vocal  cord polyps   :  Past Surgical History:  Procedure Laterality Date  . ABDOMINAL HYSTERECTOMY  1994  . COLONOSCOPY    . COLONOSCOPY W/ POLYPECTOMY  2009  . HEMORRHOID SURGERY    . JOINT REPLACEMENT    . POLYPECTOMY    . right total knee arthroplasty     Dr. Emeterio Reeve 06-04-18  . SPINE SURGERY  08/16/2009  . TOTAL KNEE ARTHROPLASTY Left 02/13/2014   Procedure: TOTAL KNEE ARTHROPLASTY;  Surgeon: Alta Corning, MD;  Location: [facility];  Service: Orthopedics;  Laterality: Left;  . TOTAL KNEE ARTHROPLASTY Right 06/04/2018   Procedure: RIGHT TOTAL KNEE ARTHROPLASTY;   Surgeon: Dorna Leitz, MD;  Location: [facility];  Service: Orthopedics;  Laterality: Right;  Adductor Block  . TUBAL LIGATION    :   Current Outpatient Medications:  .  ALPRAZolam (XANAX) 0.5 MG tablet, Take 1 tablet (0.5 mg total) by mouth 2 (two) times daily as needed. for sleep, Disp: 60 tablet, Rfl: 5 .  Ascorbic Acid (VITAMIN C PO), Take 1,000 mg by mouth daily. , Disp: , Rfl:  .  aspirin 81 MG tablet, Take 1 tablet (81 mg total) by mouth daily., Disp: 30 tablet, Rfl:  .  bisoprolol-hydrochlorothiazide (ZIAC) 5-6.25 MG tablet, TAKE 1 TABLET EVERY DAY, Disp: 90 tablet, Rfl: 1 .  Calcium Carbonate (CALCIUM 600 PO), Take 1,200 mg by mouth every other day. , Disp: , Rfl:  .  cholecalciferol (VITAMIN D) 1000 UNITS tablet, Take 1 tablet (1,000 Units total) by mouth daily., Disp: 30 tablet, Rfl: 5 .  dexamethasone (DECADRON) 4 MG tablet, Take 1 tablet (4 mg total) by mouth daily. Take with food., Disp: 30 tablet, Rfl: 0 .  fenofibrate 160 MG tablet, Take 1 tablet (160 mg total) by mouth daily., Disp: 90 tablet, Rfl: 3 .  Glycerin-Hypromellose-PEG 400 ([employer] DRY EYE RELIEF) 0.2-0.2-1 % SOLN, Place 1 drop into both eyes 2 (two) times a day., Disp: , Rfl:  .  mirtazapine (REMERON) 15 MG tablet, Take 0.5 tablets (7.5 mg total) by mouth at bedtime., Disp: 15 tablet, Rfl: 3 .  naphazoline-glycerin (CLEAR EYES REDNESS) 0.012-0.2 % SOLN, Place 1-2 drops into both eyes 4 (four) times daily as needed for eye irritation. bausch and lomb, Disp: , Rfl:  .  omeprazole (PRILOSEC) 40 MG capsule, TAKE 1 CAPSULE TWICE DAILY BEFORE MEALS, Disp: 180 capsule, Rfl: 1 .  pravastatin (PRAVACHOL) 40 MG tablet, TAKE 1 TABLET EVERY EVENING, Disp: 90 tablet, Rfl: 1 .  Probiotic Product (PROBIOTIC DAILY PO), Take 1 capsule by mouth daily., Disp: , Rfl: :  Allergies  Allergen Reactions  . Amlodipine     Rash, swelling  . Chantix [Varenicline Tartrate] Other (See Comments)    Tongue swell,sob  . Clarithromycin Rash  .  Lisinopril Hives  . Simvastatin Hives  . Wellbutrin [Bupropion Hcl] Hives  . Lipitor [Atorvastatin]     Dizziness per patient  . Sertraline     Makes her feel like she is going to kill someone  :  Family History  Problem Relation Age of Onset  . Cancer Father   . Prostate cancer Father   . Diabetes Sister   . Stroke Maternal Grandfather   . Colitis Maternal Aunt   . Colon cancer Neg Hx   . Colon polyps Neg Hx   . Rectal cancer Neg Hx   . Stomach cancer Neg Hx   . Esophageal cancer Neg Hx   :  Social History   Socioeconomic  History  . Marital status: Married    Spouse name: Not on file  . Number of children: 2  . Years of education: Not on file  . Highest education level: Not on file  Occupational History  . Not on file  Tobacco Use  . Smoking status: Light Tobacco Smoker    Packs/day: 0.25    Years: 40.00    Pack years: 10.00    Types: Cigarettes  . Smokeless tobacco: Never Used  . Tobacco comment: PACK WILL LAST 3 days  Vaping Use  . Vaping Use: Never used  Substance and Sexual Activity  . Alcohol use: No  . Drug use: No  . Sexual activity: Not Currently  Other Topics Concern  . Not on file  Social History Narrative  . Not on file   Social Determinants of Health   Financial Resource Strain: Low Risk   . Difficulty of Paying Living Expenses: Not hard at all  Food Insecurity: No Food Insecurity  . Worried About Charity fundraiser in the Last Year: Never true  . Ran Out of Food in the Last Year: Never true  Transportation Needs: No Transportation Needs  . Lack of Transportation (Medical): No  . Lack of Transportation (Non-Medical): No  Physical Activity: Sufficiently Active  . Days of Exercise per Week: 4 days  . Minutes of Exercise per Session: 50 min  Stress: No Stress Concern Present  . Feeling of Stress : Not at all  Social Connections: Socially Integrated  . Frequency of Communication with Friends and Family: More than three times a week  .  Frequency of Social Gatherings with Friends and Family: More than three times a week  . Attends Religious Services: More than 4 times per year  . Active Member of Clubs or Organizations: Yes  . Attends Archivist Meetings: More than 4 times per year  . Marital Status: Married  Human resources officer Violence: Not on file  :  REVIEW OF SYSTEMS:  A 15 point review of systems is documented in the electronic medical record. This was obtained by the nursing staff. However, I reviewed this with the patient to discuss relevant findings and make appropriate changes.  Pertinent items noted in HPI and remainder of comprehensive ROS otherwise negative.   PHYSICAL EXAM:  There were no vitals taken for this visit. In general this is a well appearing African American female in no acute distress.  She's alert and oriented x4 and appropriate throughout the examination. Cardiopulmonary assessment is negative for acute distress and she exhibits normal effort.    KPS = 60  100 - Normal; no complaints; no evidence of disease. 90   - Able to carry on normal activity; minor signs or symptoms of disease. 80   - Normal activity with effort; some signs or symptoms of disease. 80   - Cares for self; unable to carry on normal activity or to do active work. 60   - Requires occasional assistance, but is able to care for most of his personal needs. 50   - Requires considerable assistance and frequent medical care. 62   - Disabled; requires special care and assistance. 52   - Severely disabled; hospital admission is indicated although death not imminent. 60   - Very sick; hospital admission necessary; active supportive treatment necessary. 10   - Moribund; fatal processes progressing rapidly. 0     - Dead  Karnofsky DA, Abelmann WH, Craver LS and Burchenal [facility] [phone]) The use of  the nitrogen mustards in the palliative treatment of carcinoma: with particular reference to bronchogenic carcinoma Cancer 1  [id number]  LABORATORY DATA:  Lab Results  Component Value Date   WBC 9.1 08/07/2020   HGB 12.1 08/07/2020   HCT 39.3 08/07/2020   MCV 83.6 08/07/2020   PLT 312 08/07/2020   Lab Results  Component Value Date   NA 137 08/07/2020   K 4.5 08/07/2020   CL 101 08/07/2020   CO2 29 08/07/2020   Lab Results  Component Value Date   ALT 11 08/07/2020   AST 17 08/07/2020   ALKPHOS 42 08/07/2020   BILITOT 0.3 08/07/2020     RADIOGRAPHY: MR Brain Wo Contrast  Result Date: 07/31/2020 CLINICAL DATA:  Memory changes. Memory loss. Additional history provided by scanning technologist: Memory changes, right-sided weakness, worsening over the past 2 weeks. EXAM: MRI HEAD WITHOUT CONTRAST TECHNIQUE: Multiplanar, multiecho pulse sequences of the brain and surrounding structures were obtained without intravenous contrast. COMPARISON:  Brain MRI 01/10/2019. FINDINGS: Brain: Cerebral volume is normal for age. There is a 1.5 cm lesion within the left superior frontal gyrus which is predominantly T2 hyperintense and T1 hypointense. There is corresponding restricted diffusion. Prominent surrounding vasogenic edema within the left frontal lobe and extending slightly into the corpus callosum. Associated mass effect with early rightward subfalcine herniation (series 12, image 17). 3 mm rightward midline shift at the level of the septum pellucidum. Partial effacement of the left lateral ventricle. Background mild multifocal T2/FLAIR hyperintensity within the cerebral white matter is nonspecific, but compatible with chronic small vessel ischemic disease. There is no acute infarct. No chronic intracranial blood products. No extra-axial fluid collection. No midline shift. Vascular: Expected proximal arterial flow voids. Skull and upper cervical spine: No focal marrow lesion. Sinuses/Orbits: Visualized orbits show no acute finding. Trace ethmoid sinus mucosal thickening. These results were called by telephone at the time of  interpretation on 07/31/2020 at 8:39 am to provider [place] , who verbally acknowledged these results. IMPRESSION: 1.5 cm lesion within the left superior frontal gyrus with associated restricted diffusion and prominent surrounding vasogenic edema. This is favored to reflect a metastatic lesion. However, a brain abscess cannot be excluded. Postcontrast MR imaging of the brain is recommended for further evaluation. Associated mass effect with early rightward subfalcine herniation. 3 mm rightward midline shift at the level of the septum pellucidum. Partial effacement of the left lateral ventricle. Mild cerebral white matter chronic small vessel ischemic disease. Electronically Signed   By: Kellie Simmering DO   On: 07/31/2020 08:40   MR BRAIN W CONTRAST  Result Date: 08/02/2020 CLINICAL DATA:  Brain mass or lesion. EXAM: MRI HEAD WITH CONTRAST TECHNIQUE: Multiplanar, multiecho pulse sequences of the brain and surrounding structures were obtained with intravenous contrast. CONTRAST:  76mL MULTIHANCE GADOBENATE DIMEGLUMINE 529 MG/ML IV SOLN COMPARISON:  MRI of the brain July 31, 2020 FINDINGS: Postcontrast images of the brain show a lobulated left frontal lesion with necrotic center and irregular peripheral contrast enhancement measuring approximately 2.2 x 1.8 x 1.4 cm. Sulcal contrast enhancement is also noted posterior to the lesion (series 3, image 14). Prominent surrounding edema with a 3-4 mm rightward midline shift are again noted. No other enhancing lesion identified. IMPRESSION: Rim enhancing necrotic lesion in the left frontal lobe. Differential diagnosis remains the same and favors metastatic disease. Abscess remains a consideration given restricted diffusion seen on prior MRI. Electronically Signed   By: Pedro Earls M.D.   On: 08/02/2020  13:02   CT CHEST ABDOMEN PELVIS W CONTRAST  Result Date: 08/02/2020 CLINICAL DATA:  New brain lesion on MRI.  Weight loss. EXAM: CT CHEST, ABDOMEN,  AND PELVIS WITH CONTRAST TECHNIQUE: Multidetector CT imaging of the chest, abdomen and pelvis was performed following the standard protocol during bolus administration of intravenous contrast. CONTRAST:  119mL OMNIPAQUE IOHEXOL 300 MG/ML  SOLN COMPARISON:  None. FINDINGS: CT CHEST FINDINGS Cardiovascular: Normal heart size. No significant pericardial effusion/thickening. Left anterior descending and left circumflex coronary atherosclerosis. Atherosclerotic nonaneurysmal thoracic aorta. Normal caliber pulmonary arteries. No central pulmonary emboli. Mediastinum/Nodes: Subcentimeter hypodense right thyroid nodule. Not clinically significant; no follow-up imaging recommended (ref: J Am Coll Radiol. 2015 Feb;12(2): 143-50). Unremarkable esophagus. No pathologically enlarged axillary, mediastinal or hilar lymph nodes. Lungs/Pleura: No pneumothorax. No pleural effusion. Irregular solid 5.4 x 4.4 cm peripheral right upper lobe lung mass abutting and mildly distorting the minor fissure (series 3/image 40). Tiny solid 0.2 cm lingular pulmonary nodule (series 3/image 70). No additional significant pulmonary nodules. Mild centrilobular and paraseptal emphysema. Musculoskeletal: No aggressive appearing focal osseous lesions. Moderate thoracic spondylosis. CT ABDOMEN PELVIS FINDINGS Hepatobiliary: Normal liver with no liver mass. Cholelithiasis. Contracted gallbladder with no definite gallbladder wall thickening. No biliary ductal dilatation. Pancreas: Normal, with no mass or duct dilation. Spleen: Normal size. No mass. Adrenals/Urinary Tract: Normal adrenals. Normal kidneys with no hydronephrosis and no renal mass. Normal bladder. Stomach/Bowel: Normal non-distended stomach. Normal caliber small bowel with no small bowel wall thickening. Normal appendix. Oral contrast transits to the colon. Normal large bowel with no diverticulosis, large bowel wall thickening or pericolonic fat stranding. Vascular/Lymphatic: Atherosclerotic  abdominal aorta with ectatic 2.6 cm infrarenal abdominal aorta. Patent portal, splenic, hepatic and renal veins. No pathologically enlarged lymph nodes in the abdomen or pelvis. Reproductive: Status post hysterectomy, with no abnormal findings at the vaginal cuff. No adnexal mass. Other: No pneumoperitoneum, ascites or focal fluid collection. Musculoskeletal: No aggressive appearing focal osseous lesions. Moderate multilevel lumbar degenerative disc disease. Interbody spacers at L3-4 and L4-5. Surgical hardware between the L3-4 and L4-5 spinous processes. IMPRESSION: 1. Irregular solid 5.4 cm peripheral right upper lobe lung mass abutting and mildly distorting the minor fissure, compatible with primary bronchogenic carcinoma. Multi disciplinary thoracic oncology consultation suggested. 2. Tiny 0.2 cm lingular pulmonary nodule, for which follow-up chest CT is recommended in 3 months. 3. No additional potential findings of metastatic disease in the chest, abdomen or pelvis. No thoracic adenopathy. 4. Two-vessel coronary atherosclerosis. 5. Cholelithiasis. 6. Ectatic 2.6 cm infrarenal abdominal aorta. Recommend follow-up ultrasound every 5 years. This recommendation follows ACR consensus guidelines: White Paper of the ACR Incidental Findings Committee II on Vascular Findings. J Am Coll Radiol 2013; 10:789-794. 7. Aortic Atherosclerosis (ICD10-I70.0) and Emphysema (ICD10-J43.9). Electronically Signed   By: Ilona Sorrel M.D.   On: 08/02/2020 17:37      IMPRESSION: 70 yo woman with a 2.2 cm left frontal brain metastasis from putative T2b (5.4 cm) N0 M1b (isolated solitary brain) primary lung cancer, pending biopsy and staging.   Presuming her biopsy shows non-small cell lung cancer, the patient would benefit from surgical resection of the brain metastasis. In addition, the patient would potentially benefit from radiotherapy. The options include whole brain irradiation versus stereotactic radiosurgery. There are pros  and cons associated with each of these potential treatment options. Whole brain radiotherapy would treat the known metastasis and help provide some reduction of risk for future brain metastases. However, whole brain radiotherapy carries potential risks including hair  loss, subacute somnolence, and neurocognitive changes including a possible reduction in short-term memory. Whole brain radiotherapy also may carry a lower likelihood of tumor control at the treatment sites because of the low-dose used. Stereotactic radiosurgery carries a higher likelihood for local tumor control at the targeted sites with lower associated risk for neurocognitive changes such as memory loss. However, the use of stereotactic radiosurgery in this setting may leave the patient at increased risk for new brain metastases elsewhere in the brain as high as 50-60%. Accordingly, patients who receive stereotactic radiosurgery in this setting should undergo ongoing surveillance imaging with brain MRI more frequently in order to identify and treat new small brain metastases before they become symptomatic. Stereotactic radiosurgery does carry some different risks, including a risk of radionecrosis.  PLAN: Today, we reviewed the findings and workup thus far with the patient and her family. We discussed the dilemma regarding whole brain radiotherapy versus stereotactic radiosurgery. We discussed the pros and cons of each. We also discussed the logistics and delivery of each. We reviewed the results associated with each of the treatments described above. The patient seems to understand the treatment options and would like to proceed with stereotactic radiosurgery. She will continue on the low dose steroid until brain treatment has been completed. We appreciate Dr. Renda Rolls input and assistance with management of her neurologic symptoms.  In terms of timing of the stereotactic radiosurgery, evidence suggests that the risk of radionecrosis and  leptomeningeal recurrence is lower when used in the pre-operative setting as opposed to post-operative SRS.  Tentatively, the patient is set up for [place] on 08/16/2020 at 12:45 PM, with treatment coordinated with Dr. Venetia Constable on 08/23/2020 at 3:15 PM , and surgical resection with Dr. Venetia Constable on 08/24/2020.  Following up-front management of the isolated solitary brain metastasis, the patient should also be evaluated for possible right upper lobectomy.  In several series, the definitive surgical management of lung and isolated brain metastasis can be associated with long term survival.  We spent 60 min on this encounter.    Nicholos Johns, PA-C    Tyler Pita, MD  [facility] Direct Dial: [phone]  Fax: [phone] [email or web address]  Skype  LinkedIn    *References:  1: Patchell RA, Tibbs PA, Helene Shoe, [address] RJ, [place], Guy Sandifer JS, [place] B. A randomized trial of surgery in the treatment of single metastases to the brain. [place] Feb 22;322(8):494-500. PubMed PMID: [id number].   2: Patchell RA, Tibbs PA, Regine WF, Jonita Albee, Mohiuddin M, Arrie Eastern, [place], [place], [place]. Postoperative radiotherapy in the treatment of single metastases to the brain: a randomized trial. JAMA. 1998 Nov 4;280(17):1485-9. PubMed PMID: 2683419.   3: Erlene Senters, Wenda Low, Hess KR, Tomie China, Lang FF, Kornguth DG, [place], Swint JM, Shiu AS, Maor MH, [place]. Neurocognition in patients with brain metastases treated with radiosurgery or radiosurgery plus whole-brain irradiation: a randomised controlled trial. Lancet Oncol. 2009 Nov;10(11):1037-44. doi: 10.1016/S1470-2045(09)70263-3. Epub 2009 Oct 2. PubMed PMID: 62229798.  4: Weyman Rodney, Estill Dooms, Coralee Pesa, Crocker IR, Lorie Phenix, Charlesetta Garibaldi, Press RH, Tanya Nones, [place] NM, Wait SD, Higinio Plan, Shu HG, [place]. Comparing Preoperative With  Postoperative Stereotactic Radiosurgery for Resectable Brain Metastases: A Multi-institutional Analysis. Neurosurgery. 2015 Nov 2. [Epub ahead of print] PubMed PMID: 92119417.    5.  Prabhu RS, Byars, Vaslow ZK, Amie Critchley  MV, Ocie Bob, Patel TR, Asher AL, Unk Pinto MA, Garlan Fair, Patel AR, [place] Z, Erlinda Hong, Vida ST, [place] [facility].  Preoperative Radiosurgery for Resected Brain Metastases: The PROPS-BM Multicenter Cohort Study.  Int Zada Finders Oncol Biol Phys. 2021 Nov 1;111(3):764-772. doi: 10.1016/j.ijrobp.2021.05.124. Epub 2021 May 29. PMID: [id number]

## 2020-08-08 ENCOUNTER — Ambulatory Visit (HOSPITAL_COMMUNITY)
Admission: RE | Admit: 2020-08-08 | Discharge: 2020-08-08 | Disposition: A | Discharge status: Home / Self Care | Payer: Medicare HMO | Source: Ambulatory Visit | Attending: Physician Assistant | Admitting: Physician Assistant

## 2020-08-08 ENCOUNTER — Other Ambulatory Visit (HOSPITAL_COMMUNITY)
Admission: RE | Admit: 2020-08-08 | Discharge: 2020-08-08 | Disposition: A | Discharge status: Home / Self Care | Payer: Medicare HMO | Source: Ambulatory Visit | Attending: Physician Assistant | Admitting: Physician Assistant

## 2020-08-08 ENCOUNTER — Telehealth: Payer: Self-pay | Admitting: Physician Assistant

## 2020-08-08 DIAGNOSIS — R918 Other nonspecific abnormal finding of lung field: Secondary | ICD-10-CM

## 2020-08-08 DIAGNOSIS — C3411 Malignant neoplasm of upper lobe, right bronchus or lung: Secondary | ICD-10-CM | POA: Insufficient documentation

## 2020-08-08 DIAGNOSIS — Z01818 Encounter for other preprocedural examination: Secondary | ICD-10-CM | POA: Insufficient documentation

## 2020-08-08 DIAGNOSIS — I700 Atherosclerosis of aorta: Secondary | ICD-10-CM | POA: Diagnosis not present

## 2020-08-08 DIAGNOSIS — Z20822 Contact with and (suspected) exposure to covid-19: Secondary | ICD-10-CM | POA: Diagnosis not present

## 2020-08-08 DIAGNOSIS — I6523 Occlusion and stenosis of bilateral carotid arteries: Secondary | ICD-10-CM | POA: Diagnosis not present

## 2020-08-08 DIAGNOSIS — C3490 Malignant neoplasm of unspecified part of unspecified bronchus or lung: Secondary | ICD-10-CM

## 2020-08-08 LAB — GLUCOSE, CAPILLARY: Glucose-Capillary: 87 mg/dL (ref 70–99)

## 2020-08-08 LAB — SARS CORONAVIRUS 2 (TAT 6-24 HRS): SARS Coronavirus 2: NEGATIVE

## 2020-08-08 MED ORDER — FLUDEOXYGLUCOSE F - 18 (FDG) INJECTION
5.2000 | Freq: Once | INTRAVENOUS | Status: AC | PRN
  Administered 2020-08-08: 5.2 via INTRAVENOUS

## 2020-08-08 MED ORDER — HYDROCHLOROTHIAZIDE 12.5 MG PO TABS: 12.5000 mg | ORAL_TABLET | Freq: Every day | ORAL | 1 refills | Status: DC

## 2020-08-08 NOTE — Telephone Encounter (Signed)
Scheduled appointment per 2/8 los. Called patient, no answer. Left message with appointment date and time.

## 2020-08-08 NOTE — Telephone Encounter (Signed)
Spoke with patient's daughter and info given.  

## 2020-08-08 NOTE — Telephone Encounter (Signed)
Stop current BP med - bisoprolol-hydrochlorothiazide  Start hydrochlorothiazide 12.5 mg daily.  Monitor BP and heart rate  New medication sent to pharmacy

## 2020-08-08 NOTE — Addendum Note (Signed)
Addended by: [name] on: 08/08/2020 04:20 PM   Modules accepted: Orders

## 2020-08-09 ENCOUNTER — Institutional Professional Consult (permissible substitution): Payer: Medicare HMO | Admitting: Radiation Oncology

## 2020-08-09 ENCOUNTER — Encounter: Payer: Self-pay | Admitting: *Deleted

## 2020-08-09 ENCOUNTER — Other Ambulatory Visit: Payer: Self-pay | Admitting: Student

## 2020-08-09 ENCOUNTER — Ambulatory Visit
Admission: RE | Admit: 2020-08-09 | Discharge: 2020-08-09 | Disposition: A | Discharge status: Home / Self Care | Payer: Medicare HMO | Source: Ambulatory Visit | Attending: Radiation Oncology | Admitting: Radiation Oncology

## 2020-08-09 ENCOUNTER — Encounter: Payer: Self-pay | Admitting: Radiation Oncology

## 2020-08-09 VITALS — Ht 60.0 in | Wt 106.0 lb

## 2020-08-09 DIAGNOSIS — C7931 Secondary malignant neoplasm of brain: Secondary | ICD-10-CM | POA: Diagnosis not present

## 2020-08-09 DIAGNOSIS — R918 Other nonspecific abnormal finding of lung field: Secondary | ICD-10-CM | POA: Diagnosis not present

## 2020-08-09 NOTE — Progress Notes (Signed)
Location/Histology of Brain Tumor: 2.2 cm left frontal brain metastasis from putative T2b (5.4 cm) N0 M1b (isolated solitary brain) primary lung cancer, pending biopsy and staging  Patient presented with symptoms of:  several days rapidly progressive speech impairment and right sided weakness.  She and her family describe difficulty putting complete sentences together, impaired use of right arm, and dragging of the right leg while walking.  Her PCP, Dr. Quay Burow ordered imaging.  Past or anticipated interventions, if any, per neurosurgery: referral to Dr. Tammi Klippel for pre operative SRS  Past or anticipated interventions, if any, per medical oncology: Arranged CT guided lung biopsy, arranged staging PET, scheduled follow up for 2 weeks  Dose of Decadron, if applicable: yes, 4 mg began taking once per day instead of bid yesterday  Recent neurologic symptoms, if any:   Seizures: denies  Headaches: no persistent headaches  Nausea: denies  Dizziness/ataxia: denies  Difficulty with hand coordination: Left side working fine. Movement of right side improved with decadron.  Focal numbness/weakness: denies  Visual deficits/changes: denies  Confusion/Memory deficits: Word finding improved with decadron. Reports short term recall is normal  Painful bone metastases at present, if any: denies  SAFETY ISSUES:  Prior radiation? denies  Pacemaker/ICD? denies  Possible current pregnancy? no, postmenopausal  Is the patient on methotrexate? no  Additional Complaints / other details: 70 year old female. Married. Sisters rotate caregiving.

## 2020-08-09 NOTE — Progress Notes (Signed)
I followed up on Hannah [name] schedule.  She is set up for her bx and MR brain.

## 2020-08-10 ENCOUNTER — Ambulatory Visit (HOSPITAL_COMMUNITY)
Admission: RE | Admit: 2020-08-10 | Discharge: 2020-08-10 | Disposition: A | Discharge status: Home / Self Care | Payer: Medicare HMO | Source: Ambulatory Visit | Attending: Physician Assistant | Admitting: Physician Assistant

## 2020-08-10 ENCOUNTER — Other Ambulatory Visit: Payer: Self-pay

## 2020-08-10 ENCOUNTER — Encounter (HOSPITAL_COMMUNITY): Payer: Self-pay

## 2020-08-10 ENCOUNTER — Ambulatory Visit (HOSPITAL_COMMUNITY)
Admission: RE | Admit: 2020-08-10 | Discharge: 2020-08-10 | Disposition: A | Discharge status: Home / Self Care | Payer: Medicare HMO | Source: Ambulatory Visit | Attending: Interventional Radiology | Admitting: Interventional Radiology

## 2020-08-10 DIAGNOSIS — C3490 Malignant neoplasm of unspecified part of unspecified bronchus or lung: Secondary | ICD-10-CM | POA: Diagnosis present

## 2020-08-10 DIAGNOSIS — E785 Hyperlipidemia, unspecified: Secondary | ICD-10-CM | POA: Insufficient documentation

## 2020-08-10 DIAGNOSIS — Z7982 Long term (current) use of aspirin: Secondary | ICD-10-CM | POA: Insufficient documentation

## 2020-08-10 DIAGNOSIS — R918 Other nonspecific abnormal finding of lung field: Secondary | ICD-10-CM | POA: Diagnosis not present

## 2020-08-10 DIAGNOSIS — I10 Essential (primary) hypertension: Secondary | ICD-10-CM | POA: Diagnosis not present

## 2020-08-10 DIAGNOSIS — Z803 Family history of malignant neoplasm of breast: Secondary | ICD-10-CM | POA: Diagnosis not present

## 2020-08-10 DIAGNOSIS — C3411 Malignant neoplasm of upper lobe, right bronchus or lung: Secondary | ICD-10-CM | POA: Insufficient documentation

## 2020-08-10 DIAGNOSIS — Z96653 Presence of artificial knee joint, bilateral: Secondary | ICD-10-CM | POA: Insufficient documentation

## 2020-08-10 DIAGNOSIS — Z809 Family history of malignant neoplasm, unspecified: Secondary | ICD-10-CM | POA: Diagnosis not present

## 2020-08-10 DIAGNOSIS — Z79899 Other long term (current) drug therapy: Secondary | ICD-10-CM | POA: Insufficient documentation

## 2020-08-10 DIAGNOSIS — Z87891 Personal history of nicotine dependence: Secondary | ICD-10-CM | POA: Insufficient documentation

## 2020-08-10 DIAGNOSIS — Z90710 Acquired absence of both cervix and uterus: Secondary | ICD-10-CM | POA: Diagnosis not present

## 2020-08-10 DIAGNOSIS — Z8042 Family history of malignant neoplasm of prostate: Secondary | ICD-10-CM | POA: Insufficient documentation

## 2020-08-10 DIAGNOSIS — R7303 Prediabetes: Secondary | ICD-10-CM | POA: Diagnosis not present

## 2020-08-10 DIAGNOSIS — I351 Nonrheumatic aortic (valve) insufficiency: Secondary | ICD-10-CM | POA: Insufficient documentation

## 2020-08-10 DIAGNOSIS — G939 Disorder of brain, unspecified: Secondary | ICD-10-CM | POA: Diagnosis not present

## 2020-08-10 DIAGNOSIS — Z9889 Other specified postprocedural states: Secondary | ICD-10-CM

## 2020-08-10 DIAGNOSIS — C3491 Malignant neoplasm of unspecified part of right bronchus or lung: Secondary | ICD-10-CM | POA: Diagnosis not present

## 2020-08-10 DIAGNOSIS — R911 Solitary pulmonary nodule: Secondary | ICD-10-CM | POA: Diagnosis not present

## 2020-08-10 LAB — CBC
HCT: 39.7 % (ref 36.0–46.0)
Hemoglobin: 12.4 g/dL (ref 12.0–15.0)
MCH: 26.3 pg (ref 26.0–34.0)
MCHC: 31.2 g/dL (ref 30.0–36.0)
MCV: 84.3 fL (ref 80.0–100.0)
Platelets: 282 10*3/uL (ref 150–400)
RBC: 4.71 MIL/uL (ref 3.87–5.11)
RDW: 12.8 % (ref 11.5–15.5)
WBC: 9.9 10*3/uL (ref 4.0–10.5)
nRBC: 0 % (ref 0.0–0.2)

## 2020-08-10 LAB — PROTIME-INR
INR: 1 (ref 0.8–1.2)
Prothrombin Time: 12.8 seconds (ref 11.4–15.2)

## 2020-08-10 MED ORDER — FENTANYL CITRATE (PF) 100 MCG/2ML IJ SOLN
INTRAMUSCULAR | Status: AC
  Filled 2020-08-10: qty 2

## 2020-08-10 MED ORDER — MIDAZOLAM HCL 2 MG/2ML IJ SOLN
INTRAMUSCULAR | Status: AC
  Filled 2020-08-10: qty 4

## 2020-08-10 MED ORDER — MIDAZOLAM HCL 2 MG/2ML IJ SOLN
INTRAMUSCULAR | Status: AC | PRN
  Administered 2020-08-10: 0.5 mg via INTRAVENOUS
  Administered 2020-08-10: 1 mg via INTRAVENOUS

## 2020-08-10 MED ORDER — SODIUM CHLORIDE 0.9 % IV SOLN: INTRAVENOUS | Status: DC

## 2020-08-10 MED ORDER — LIDOCAINE HCL (PF) 1 % IJ SOLN
INTRAMUSCULAR | Status: AC | PRN
  Administered 2020-08-10: 10 mL via INTRADERMAL

## 2020-08-10 MED ORDER — FENTANYL CITRATE (PF) 100 MCG/2ML IJ SOLN
INTRAMUSCULAR | Status: AC | PRN
  Administered 2020-08-10: 50 ug via INTRAVENOUS

## 2020-08-10 NOTE — Procedures (Signed)
Interventional Radiology Procedure Note  Procedure: CT guided biopsy of RUL lung mass. Complications: No immediate Recommendations: - Bedrest until CXR cleared.  Minimize talking, coughing or otherwise straining.  - Follow up CXR pending   Signed,  Criselda Peaches, MD

## 2020-08-10 NOTE — Progress Notes (Signed)
1100-Post Lung biopsy- CXR with NO Pneumothorax noted.   Patient Stable with NO chest discomfort/SOB noted

## 2020-08-10 NOTE — H&P (Signed)
Chief Complaint: Patient was seen in consultation today for image guided right lung mass biopsy at the request of [place]  Referring Physician(s): Heilingoetter,Cassandra L  Supervising Physician: Jacqulynn Cadet  Patient Status: [facility] - Out-pt  History of Present Illness: KENADIE ROYCE is a 70 y.o. female with past medical history of prediabetes, hypertension, hyperlipidemia, mild/moderate aortic regurgitation, and newly diagnosed metastatic lung cancer who presents to interventional radiology for right lung mass biopsy. Patient started to having decreased appetite, weight loss, weakness, memory changes, and speech difficulty.  MRI performed on July 31, 2020 showed a left frontal lobe mass with surrounding edema.  The findings were favored to be secondary to metastatic disease.  Patient was referred to oncology for further work up. Oncology workup including CT and PET scan revealed an irregular, solid peripheral right long lobe lung mass consistent with bronchogenic carcinoma.   IR was requested by Dr. Tammi Klippel for image guided right lung mass biopsy for tissue confirmation.  Patient laying in bed, has no complaints today.  Denise fever, chills, headache, chest pain, short of breath, abdominal pain, nausea, vomiting.    Past Medical History:  Diagnosis Date  . Allergy   . Anemia   . Anxiety   . Arthritis   . Carpal tunnel syndrome   . Constipation    senna C stool softeners help   . Depression   . Diverticulosis   . Dyslipidemia   . External hemorrhoids   . GERD (gastroesophageal reflux disease)   . Heart murmur    mild-moderate AR  . Hiatal hernia   . Hyperlipidemia    on meds   . Hypertension   . Internal hemorrhoids   . Osteoarthritis   . Pre-diabetes   . PVD (peripheral vascular disease) (HCC)    moderate carotid disease  . RBBB   . Smoker   . Vocal cord polyps     Past Surgical History:  Procedure Laterality Date  . ABDOMINAL  HYSTERECTOMY  1994  . COLONOSCOPY    . COLONOSCOPY W/ POLYPECTOMY  2009  . HEMORRHOID SURGERY    . JOINT REPLACEMENT    . POLYPECTOMY    . right total knee arthroplasty     Dr. Emeterio Reeve 06-04-18  . SPINE SURGERY  08/16/2009  . TOTAL KNEE ARTHROPLASTY Left 02/13/2014   Procedure: TOTAL KNEE ARTHROPLASTY;  Surgeon: Alta Corning, MD;  Location: [facility];  Service: Orthopedics;  Laterality: Left;  . TOTAL KNEE ARTHROPLASTY Right 06/04/2018   Procedure: RIGHT TOTAL KNEE ARTHROPLASTY;  Surgeon: Dorna Leitz, MD;  Location: [facility];  Service: Orthopedics;  Laterality: Right;  Adductor Block  . TUBAL LIGATION      Allergies: Amlodipine, Chantix [varenicline tartrate], Clarithromycin, Lisinopril, Simvastatin, Wellbutrin [bupropion hcl], Lipitor [atorvastatin], and Sertraline  Medications: Prior to Admission medications   Medication Sig Start Date End Date Taking? Authorizing Provider  ALPRAZolam Duanne Moron) 0.5 MG tablet Take 1 tablet (0.5 mg total) by mouth 2 (two) times daily as needed. for sleep 03/14/20  Yes Burns, Claudina Lick, MD  dexamethasone (DECADRON) 4 MG tablet Take 1 tablet (4 mg total) by mouth daily. Take with food. 08/06/20  Yes Vaslow, Acey Lav, MD  fenofibrate 160 MG tablet Take 1 tablet (160 mg total) by mouth daily. 03/14/20  Yes Burns, Claudina Lick, MD  mirtazapine (REMERON) 15 MG tablet Take 0.5 tablets (7.5 mg total) by mouth at bedtime. 07/24/20  Yes Burns, Claudina Lick, MD  naphazoline-glycerin (CLEAR EYES REDNESS) 0.012-0.2 % SOLN Place 1-2  drops into both eyes 4 (four) times daily as needed for eye irritation. bausch and lomb   Yes [provider]  omeprazole (PRILOSEC) 40 MG capsule TAKE 1 CAPSULE TWICE DAILY BEFORE MEALS 02/16/20  Yes Hochrein, Jeneen Rinks, MD  pravastatin (PRAVACHOL) 40 MG tablet TAKE 1 TABLET EVERY EVENING 05/28/20  Yes Minus Breeding, MD  Ascorbic Acid (VITAMIN C PO) Take 1,000 mg by mouth daily.  Patient not taking: Reported on 08/09/2020    [provider]   aspirin 81 MG tablet Take 1 tablet (81 mg total) by mouth daily. Patient not taking: Reported on 08/09/2020 09/10/18   [name], MD  Calcium Carbonate (CALCIUM 600 PO) Take 1,200 mg by mouth every other day.  Patient not taking: Reported on 08/09/2020    [provider]  cholecalciferol (VITAMIN D) 1000 UNITS tablet Take 1 tablet (1,000 Units total) by mouth daily. Patient not taking: Reported on 08/09/2020 05/30/14   Ivar Drape D, [place]  Glycerin-Hypromellose-PEG 400 ([facility] DRY EYE RELIEF) 0.2-0.2-1 % SOLN Place 1 drop into both eyes 2 (two) times a day. Patient not taking: Reported on 08/09/2020    [provider]  hydrochlorothiazide (HYDRODIURIL) 12.5 MG tablet Take 1 tablet (12.5 mg total) by mouth daily. Patient not taking: Reported on 08/09/2020 08/08/20   [name], MD  Probiotic Product (PROBIOTIC DAILY PO) Take 1 capsule by mouth daily. Patient not taking: Reported on 08/09/2020    [provider]     Family History  Problem Relation Age of Onset  . Cancer Father   . Prostate cancer Father   . Diabetes Sister   . Cancer Sister   . Stroke Maternal Grandfather   . Colitis Maternal Aunt   . Breast cancer Maternal Aunt   . Colon cancer Neg Hx   . Colon polyps Neg Hx   . Rectal cancer Neg Hx   . Stomach cancer Neg Hx   . Esophageal cancer Neg Hx     Social History   Socioeconomic History  . Marital status: Married    Spouse name: Not on file  . Number of children: 2  . Years of education: Not on file  . Highest education level: Not on file  Occupational History  . Not on file  Tobacco Use  . Smoking status: Former Smoker    Packs/day: 0.25    Years: 40.00    Pack years: 10.00    Types: Cigarettes    Quit date: 07/09/2020    Years since quitting: 0.0  . Smokeless tobacco: Never Used  . Tobacco comment: PACK WILL LAST 3 days  Vaping Use  . Vaping Use: Never used  Substance and Sexual Activity  . Alcohol use: No  . Drug use: No   . Sexual activity: Not Currently  Other Topics Concern  . Not on file  Social History Narrative  . Not on file   Social Determinants of Health   Financial Resource Strain: Low Risk   . Difficulty of Paying Living Expenses: Not hard at all  Food Insecurity: No Food Insecurity  . Worried About Charity fundraiser in the Last Year: Never true  . Ran Out of Food in the Last Year: Never true  Transportation Needs: No Transportation Needs  . Lack of Transportation (Medical): No  . Lack of Transportation (Non-Medical): No  Physical Activity: Sufficiently Active  . Days of Exercise per Week: 4 days  . Minutes of Exercise per Session: 50 min  Stress:  No Stress Concern Present  . Feeling of Stress : Not at all  Social Connections: Socially Integrated  . Frequency of Communication with Friends and Family: More than three times a week  . Frequency of Social Gatherings with Friends and Family: More than three times a week  . Attends Religious Services: More than 4 times per year  . Active Member of Clubs or Organizations: Yes  . Attends Archivist Meetings: More than 4 times per year  . Marital Status: Married    Review of Systems: A 12 point ROS discussed and pertinent positives are indicated in the HPI above.  All other systems are negative.  Vital Signs: BP 134/65   Pulse (!) 39   Temp 97.8 F (36.6 C) (Oral)   Resp 18   SpO2 100%   Physical Exam Cardiovascular:     Rate and Rhythm: Regular rhythm. Bradycardia present.     Heart sounds: Murmur heard.      Comments: Patient bradycardic at baseline.  History of AR Pulmonary:     Effort: Pulmonary effort is normal.     Breath sounds: Normal breath sounds.  Abdominal:     General: Abdomen is flat. Bowel sounds are normal.     Palpations: Abdomen is soft.  Neurological:     Mental Status: She is alert and oriented to person, place, and time.  Psychiatric:        Mood and Affect: Mood normal.        Behavior:  Behavior normal.     Imaging: MR Brain Wo Contrast  Result Date: 07/31/2020 CLINICAL DATA:  Memory changes. Memory loss. Additional history provided by scanning technologist: Memory changes, right-sided weakness, worsening over the past 2 weeks. EXAM: MRI HEAD WITHOUT CONTRAST TECHNIQUE: Multiplanar, multiecho pulse sequences of the brain and surrounding structures were obtained without intravenous contrast. COMPARISON:  Brain MRI 01/10/2019. FINDINGS: Brain: Cerebral volume is normal for age. There is a 1.5 cm lesion within the left superior frontal gyrus which is predominantly T2 hyperintense and T1 hypointense. There is corresponding restricted diffusion. Prominent surrounding vasogenic edema within the left frontal lobe and extending slightly into the corpus callosum. Associated mass effect with early rightward subfalcine herniation (series 12, image 17). 3 mm rightward midline shift at the level of the septum pellucidum. Partial effacement of the left lateral ventricle. Background mild multifocal T2/FLAIR hyperintensity within the cerebral white matter is nonspecific, but compatible with chronic small vessel ischemic disease. There is no acute infarct. No chronic intracranial blood products. No extra-axial fluid collection. No midline shift. Vascular: Expected proximal arterial flow voids. Skull and upper cervical spine: No focal marrow lesion. Sinuses/Orbits: Visualized orbits show no acute finding. Trace ethmoid sinus mucosal thickening. These results were called by telephone at the time of interpretation on 07/31/2020 at 8:39 am to provider [employer] , who verbally acknowledged these results. IMPRESSION: 1.5 cm lesion within the left superior frontal gyrus with associated restricted diffusion and prominent surrounding vasogenic edema. This is favored to reflect a metastatic lesion. However, a brain abscess cannot be excluded. Postcontrast MR imaging of the brain is recommended for further evaluation.  Associated mass effect with early rightward subfalcine herniation. 3 mm rightward midline shift at the level of the septum pellucidum. Partial effacement of the left lateral ventricle. Mild cerebral white matter chronic small vessel ischemic disease. Electronically Signed   By: Kellie Simmering DO   On: 07/31/2020 08:40   MR BRAIN W CONTRAST  Result Date: 08/02/2020 CLINICAL  DATA:  Brain mass or lesion. EXAM: MRI HEAD WITH CONTRAST TECHNIQUE: Multiplanar, multiecho pulse sequences of the brain and surrounding structures were obtained with intravenous contrast. CONTRAST:  59mL MULTIHANCE GADOBENATE DIMEGLUMINE 529 MG/ML IV SOLN COMPARISON:  MRI of the brain July 31, 2020 FINDINGS: Postcontrast images of the brain show a lobulated left frontal lesion with necrotic center and irregular peripheral contrast enhancement measuring approximately 2.2 x 1.8 x 1.4 cm. Sulcal contrast enhancement is also noted posterior to the lesion (series 3, image 14). Prominent surrounding edema with a 3-4 mm rightward midline shift are again noted. No other enhancing lesion identified. IMPRESSION: Rim enhancing necrotic lesion in the left frontal lobe. Differential diagnosis remains the same and favors metastatic disease. Abscess remains a consideration given restricted diffusion seen on prior MRI. Electronically Signed   By: Pedro Earls M.D.   On: 08/02/2020 13:02   CT CHEST ABDOMEN PELVIS W CONTRAST  Result Date: 08/02/2020 CLINICAL DATA:  New brain lesion on MRI.  Weight loss. EXAM: CT CHEST, ABDOMEN, AND PELVIS WITH CONTRAST TECHNIQUE: Multidetector CT imaging of the chest, abdomen and pelvis was performed following the standard protocol during bolus administration of intravenous contrast. CONTRAST:  155mL OMNIPAQUE IOHEXOL 300 MG/ML  SOLN COMPARISON:  None. FINDINGS: CT CHEST FINDINGS Cardiovascular: Normal heart size. No significant pericardial effusion/thickening. Left anterior descending and left circumflex  coronary atherosclerosis. Atherosclerotic nonaneurysmal thoracic aorta. Normal caliber pulmonary arteries. No central pulmonary emboli. Mediastinum/Nodes: Subcentimeter hypodense right thyroid nodule. Not clinically significant; no follow-up imaging recommended (ref: J Am Coll Radiol. 2015 Feb;12(2): 143-50). Unremarkable esophagus. No pathologically enlarged axillary, mediastinal or hilar lymph nodes. Lungs/Pleura: No pneumothorax. No pleural effusion. Irregular solid 5.4 x 4.4 cm peripheral right upper lobe lung mass abutting and mildly distorting the minor fissure (series 3/image 40). Tiny solid 0.2 cm lingular pulmonary nodule (series 3/image 70). No additional significant pulmonary nodules. Mild centrilobular and paraseptal emphysema. Musculoskeletal: No aggressive appearing focal osseous lesions. Moderate thoracic spondylosis. CT ABDOMEN PELVIS FINDINGS Hepatobiliary: Normal liver with no liver mass. Cholelithiasis. Contracted gallbladder with no definite gallbladder wall thickening. No biliary ductal dilatation. Pancreas: Normal, with no mass or duct dilation. Spleen: Normal size. No mass. Adrenals/Urinary Tract: Normal adrenals. Normal kidneys with no hydronephrosis and no renal mass. Normal bladder. Stomach/Bowel: Normal non-distended stomach. Normal caliber small bowel with no small bowel wall thickening. Normal appendix. Oral contrast transits to the colon. Normal large bowel with no diverticulosis, large bowel wall thickening or pericolonic fat stranding. Vascular/Lymphatic: Atherosclerotic abdominal aorta with ectatic 2.6 cm infrarenal abdominal aorta. Patent portal, splenic, hepatic and renal veins. No pathologically enlarged lymph nodes in the abdomen or pelvis. Reproductive: Status post hysterectomy, with no abnormal findings at the vaginal cuff. No adnexal mass. Other: No pneumoperitoneum, ascites or focal fluid collection. Musculoskeletal: No aggressive appearing focal osseous lesions. Moderate  multilevel lumbar degenerative disc disease. Interbody spacers at L3-4 and L4-5. Surgical hardware between the L3-4 and L4-5 spinous processes. IMPRESSION: 1. Irregular solid 5.4 cm peripheral right upper lobe lung mass abutting and mildly distorting the minor fissure, compatible with primary bronchogenic carcinoma. Multi disciplinary thoracic oncology consultation suggested. 2. Tiny 0.2 cm lingular pulmonary nodule, for which follow-up chest CT is recommended in 3 months. 3. No additional potential findings of metastatic disease in the chest, abdomen or pelvis. No thoracic adenopathy. 4. Two-vessel coronary atherosclerosis. 5. Cholelithiasis. 6. Ectatic 2.6 cm infrarenal abdominal aorta. Recommend follow-up ultrasound every 5 years. This recommendation follows ACR consensus guidelines: White Paper of  the ACR Incidental Findings Committee II on Vascular Findings. J Am Coll Radiol 2013; 10:789-794. 7. Aortic Atherosclerosis (ICD10-I70.0) and Emphysema (ICD10-J43.9). Electronically Signed   By: Ilona Sorrel M.D.   On: 08/02/2020 17:37   NM PET Image Initial (PI) Skull Base To Thigh  Result Date: 08/08/2020 CLINICAL DATA:  Initial treatment strategy for recently diagnosed right upper lobe lung cancer. Intracranial metastatic disease. EXAM: NUCLEAR MEDICINE PET SKULL BASE TO THIGH TECHNIQUE: 5.2 mCi F-18 FDG was injected intravenously. Full-ring PET imaging was performed from the skull base to thigh after the radiotracer. CT data was obtained and used for attenuation correction and anatomic localization. Fasting blood glucose: 87 mg/dl COMPARISON:  Chest CT 08/02/2020. FINDINGS: Mediastinal blood pool activity: SUV max 2.4 NECK: No hypermetabolic cervical lymph nodes are identified.There is asymmetric hypermetabolic activity in the region of the right arytenoid cartilage (SUV max 6.4). No focal corresponding mucosal abnormality identified on the CT images. No other lesions of the pharyngeal mucosal space. Incidental  CT findings: Bilateral carotid atherosclerosis. CHEST: There are no hypermetabolic mediastinal, hilar or axillary lymph nodes. The previously demonstrated large peripheral right upper lobe mass demonstrates peripheral hypermetabolic activity with an SUV max of 11.0. This lesion is centrally necrotic with areas of fluid density and gas. It measures approximately 5.3 x 4.3 cm on image 26/8. The lesion extends to the visceral pleural surface, although demonstrates no chest wall invasion. Tiny lingular nodule on image 38/8 is unchanged. Incidental CT findings: Mild centrilobular and paraseptal emphysema. Mild atherosclerosis of the aorta, great vessels and coronary arteries. ABDOMEN/PELVIS: There is no hypermetabolic activity within the liver, adrenal glands, spleen or pancreas. There is no hypermetabolic nodal activity. There is nonspecific low-level and erectile activity (SUV max 6.2). No other abnormal bowel activity. Incidental CT findings: Calcified gallstones. Aortic and branch vessel atherosclerosis. A degree of rectal prolapse is suspected. SKELETON: There is no hypermetabolic activity to suggest osseous metastatic disease. Incidental CT findings: none IMPRESSION: 1. The previously demonstrated large right upper lobe mass is peripherally hypermetabolic, consistent with bronchogenic carcinoma. This lesion demonstrates progressive central necrosis. 2. No evidence of metastatic disease. By imaging, this corresponds with stage II B disease (T3 N0 M0). 3. Nonspecific asymmetric activity associated with the arytenoid cartilage, potentially physiologic. Correlate with direct visualization if there are symptoms referable to this region. 4. Nonspecific anorectal activity, suspected to be related to a degree of rectal prolapse. Correlate clinically. 5. Incidental findings as previously described, including cholelithiasis and Aortic Atherosclerosis (ICD10-I70.0). Electronically Signed   By: Richardean Sale M.D.   On:  08/08/2020 16:06    Labs:  CBC: Recent Labs    03/14/20 1016 07/24/20 0846 08/07/20 0856 08/10/20 0730  WBC 6.2 7.3 9.1 9.9  HGB 11.3* 10.8* 12.1 12.4  HCT 35.8 33.8* 39.3 39.7  PLT 300 322.0 312 282    COAGS: Recent Labs    08/10/20 0730  INR 1.0    BMP: Recent Labs    09/12/19 1027 03/14/20 1016 07/24/20 0846 08/07/20 0856  NA 140 141 142 137  K 3.7 3.9 4.0 4.5  CL 102 104 105 101  CO2 32 30 32 29  GLUCOSE 96 76 100* 109*  BUN 16 10 15  26*  CALCIUM 10.2 10.1 10.5 10.4*  CREATININE 0.81 0.79 0.69 0.92  GFRNONAA  --  75  --  >60  GFRAA  --  87  --   --     LIVER FUNCTION TESTS: Recent Labs    09/12/19 1027 03/14/20  1016 07/24/20 0846 08/07/20 0856  BILITOT 0.4 0.4 0.4 0.3  AST 23 19 18 17   ALT 15 13 11 11   ALKPHOS 43  --  37* 42  PROT 7.4 6.8 7.3 7.7  ALBUMIN 4.4  --  4.3 4.1    TUMOR MARKERS: No results for input(s): AFPTM, CEA, CA199, CHROMGRNA in the last 8760 hours.  Assessment and Plan:  70 y.o. female with recent diagnosis of lung cancer.  Imaging showed right mass with metastatic lesion on the left frontal lobe of the brain.  NPO since midnight.  INR 1.0 CBC unremarkable.   Patient presents to IR today for image guided right lung mass biopsy for tissue confirmation.   Risks and benefits of lung biopsy was discussed with the patient and/or patient's family including, but not limited to bleeding, infection, damage to adjacent structures or low yield requiring additional tests.  All of the questions were answered and there is agreement to proceed.  Consent signed and in chart.  Thank you for this interesting consult.  I greatly enjoyed meeting CATALINA SALASAR and look forward to participating in their care.  A copy of this report was sent to the requesting provider on this date.  Electronically Signed: Tera Mater, PA-C 08/10/2020, 8:25 AM   I spent a total of  30 Minutes   in face to face in clinical consultation, greater than  50% of which was counseling/coordinating care for  image guided right lung mass biopsy for tissue confirmation.

## 2020-08-10 NOTE — Discharge Instructions (Signed)
Lung Biopsy, Care After This information will help you take care of yourself after your procedure. Your health care provider may also give you more specific instructions depending on the type of biopsy you had. If you have problems or questions, contact your health care provider. What can I expect after the procedure? After the procedure, it is common to have:  A cough.  A sore throat.  Pain where a needle or incision was used to collect a biopsy sample (biopsy site). Follow these instructions at home: Medicines  Take over-the-counter and prescription medicines only as told by your health care provider.  Talk to your health care provider before you take any medicines that contain aspirin or NSAIDS, such as ibuprofen. These medicines can increase your risk of bleeding.  Ask your health care provider if the medicine prescribed to you: ? Requires you to avoid driving or using machinery. ? Can cause constipation. You may need to take these actions to prevent or treat constipation:  Drink enough fluid to keep your urine pale yellow.  Take over-the-counter or prescription medicines.  Eat foods that are high in fiber, such as beans, whole grains, fresh fruits and vegetables.  Limit foods that are high in fat and processed sugars, such as fried or sweet foods. Biopsy site care  If you had a needle or open biopsy, follow instructions from your health care provider about how to take care of your biopsy site. Make sure you: ? Wash your hands with soap and water for at least 20 seconds before and after you change your bandage (dressing). If soap and water are not available, use hand sanitizer. ? Change your dressing as told by your health care provider. ? Leave stitches (sutures), skin glue, or adhesive strips in place for as long as you are told. If adhesive strip edges start to loosen and curl up, you may trim the loose edges. Do not remove adhesive strips completely unless your health care  provider tells you to do that.  Do not take baths, swim, or use a hot tub until your health care provider approves. Ask your health care provider if you may take showers. You may only be allowed to take sponge baths.  Check your biopsy site every day for signs of infection. Check for: ? Redness, swelling, or more pain. ? Fluid or blood. ? Warmth. ? Pus or a bad smell.   General instructions  Do not drink alcohol if your health care provider tells you not to drink.  If you were given a sedative during the procedure, it can affect you for several hours. Do not drive or operate machinery until your health care provider says that it is safe.  Return to your normal activities as told by your health care provider. Ask your health care provider what activities are safe for you.  It is up to you to get the results of your procedure. Ask your health care provider, or the department that is doing the procedure, when your results will be ready.  Keep all follow-up visits as told by your health care provider. This is important. Contact a health care provider if:  You have a fever.  You have redness, swelling, or more pain around your biopsy site.  You have fluid or blood coming from your biopsy site.  Your biopsy site feels warm to the touch.  You have pus or a bad smell coming from your biopsy site.  You have pain that does not get better with medicine.  Get help right away if:  You cough up blood.  You have trouble breathing.  You have chest pain.  You lose consciousness. These symptoms may represent a serious problem that is an emergency. Do not wait to see if the symptoms will go away. Get medical help right away. Call your local emergency services (911 in the [place] Do not drive yourself to the hospital. Summary  It is common to have some pain where a needle or incision was used to collect a biopsy sample (biopsy site).  Return to your normal activities as told by your health  care provider. Ask your health care provider what activities are safe for you.  Take over-the-counter and prescription medicines only as told by your health care provider.  Report any unusual symptoms to your health care provider. This information is not intended to replace advice given to you by your health care provider. Make sure you discuss any questions you have with your health care provider.   Moderate Conscious Sedation, Adult, Care After This sheet gives you information about how to care for yourself after your procedure. Your health care provider may also give you more specific instructions. If you have problems or questions, contact your health care provider. What can I expect after the procedure? After the procedure, it is common to have:  Sleepiness for several hours.  Impaired judgment for several hours.  Difficulty with balance.  Vomiting if you eat too soon. Follow these instructions at home: For the time period you were told by your health care provider:  Rest.  Do not participate in activities where you could fall or become injured.  Do not drive or use machinery.  Do not drink alcohol.  Do not take sleeping pills or medicines that cause drowsiness.  Do not make important decisions or sign legal documents.  Do not take care of children on your own.      Eating and drinking  Follow the diet recommended by your health care provider.  Drink enough fluid to keep your urine pale yellow.  If you vomit: ? Drink water, juice, or soup when you can drink without vomiting. ? Make sure you have little or no nausea before eating solid foods.   General instructions  Take over-the-counter and prescription medicines only as told by your health care provider.  Have a responsible adult stay with you for the time you are told. It is important to have someone help care for you until you are awake and alert.  Do not smoke.  Keep all follow-up visits as told by your  health care provider. This is important. Contact a health care provider if:  You are still sleepy or having trouble with balance after 24 hours.  You feel light-headed.  You keep feeling nauseous or you keep vomiting.  You develop a rash.  You have a fever.  You have redness or swelling around the IV site. Get help right away if:  You have trouble breathing.  You have new-onset confusion at home. Summary  After the procedure, it is common to feel sleepy, have impaired judgment, or feel nauseous if you eat too soon.  Rest after you get home. Know the things you should not do after the procedure.  Follow the diet recommended by your health care provider and drink enough fluid to keep your urine pale yellow.  Get help right away if you have trouble breathing or new-onset confusion at home. This information is not intended to replace advice given  to you by your health care provider. Make sure you discuss any questions you have with your health care provider. Document Revised: 10/14/2019 Document Reviewed: 05/12/2019 Elsevier Patient Education  2021 [employer].

## 2020-08-14 ENCOUNTER — Encounter: Payer: Self-pay | Admitting: Internal Medicine

## 2020-08-14 ENCOUNTER — Encounter: Payer: Self-pay | Admitting: *Deleted

## 2020-08-14 LAB — SURGICAL PATHOLOGY

## 2020-08-14 NOTE — Progress Notes (Signed)
I received a message from Dr. Jeannie Done. She updated me that bx is lung primary adeno. [facility] for molecular and PDL 1 requested.

## 2020-08-15 ENCOUNTER — Ambulatory Visit
Admission: RE | Admit: 2020-08-15 | Discharge: 2020-08-15 | Disposition: A | Discharge status: Home / Self Care | Payer: Medicare HMO | Source: Ambulatory Visit | Attending: Radiation Oncology | Admitting: Radiation Oncology

## 2020-08-15 ENCOUNTER — Other Ambulatory Visit: Payer: Self-pay

## 2020-08-15 DIAGNOSIS — C7931 Secondary malignant neoplasm of brain: Secondary | ICD-10-CM

## 2020-08-15 DIAGNOSIS — G936 Cerebral edema: Secondary | ICD-10-CM | POA: Diagnosis not present

## 2020-08-15 MED ORDER — GADOBENATE DIMEGLUMINE 529 MG/ML IV SOLN
9.0000 mL | Freq: Once | INTRAVENOUS | Status: AC | PRN
  Administered 2020-08-15: 9 mL via INTRAVENOUS

## 2020-08-16 ENCOUNTER — Other Ambulatory Visit: Payer: Self-pay

## 2020-08-16 ENCOUNTER — Ambulatory Visit (HOSPITAL_COMMUNITY): Payer: Medicare HMO

## 2020-08-16 ENCOUNTER — Inpatient Hospital Stay (HOSPITAL_BASED_OUTPATIENT_CLINIC_OR_DEPARTMENT_OTHER): Payer: Medicare HMO | Admitting: Internal Medicine

## 2020-08-16 ENCOUNTER — Other Ambulatory Visit: Payer: Self-pay | Admitting: Internal Medicine

## 2020-08-16 ENCOUNTER — Ambulatory Visit
Admission: RE | Admit: 2020-08-16 | Discharge: 2020-08-16 | Disposition: A | Discharge status: Home / Self Care | Payer: Medicare HMO | Source: Ambulatory Visit | Attending: Radiation Oncology | Admitting: Radiation Oncology

## 2020-08-16 VITALS — BP 133/81 | HR 56 | Temp 97.0°F | Resp 18 | Ht 60.0 in | Wt 106.4 lb

## 2020-08-16 DIAGNOSIS — C7931 Secondary malignant neoplasm of brain: Secondary | ICD-10-CM

## 2020-08-16 DIAGNOSIS — C3411 Malignant neoplasm of upper lobe, right bronchus or lung: Secondary | ICD-10-CM | POA: Insufficient documentation

## 2020-08-16 MED ORDER — DEXAMETHASONE 2 MG PO TABS: 2.0000 mg | ORAL_TABLET | Freq: Every day | ORAL | Status: DC

## 2020-08-16 MED ORDER — SODIUM CHLORIDE 0.9% FLUSH
10.0000 mL | Freq: Once | INTRAVENOUS | Status: AC
  Administered 2020-08-16: 10 mL via INTRAVENOUS

## 2020-08-16 NOTE — Progress Notes (Signed)
Has armband been applied?  Yes.    Does patient have an allergy to IV contrast dye?: No.   Has patient ever received premedication for IV contrast dye?: No.   Does patient take metformin?: No.  If patient does take metformin when was the last dose: n/a  Date of lab work: 08/07/20   BUN: 26 CR: 0.92  IV site: antecubital left, condition no redness and upper arm, patent  Has IV site been added to flowsheet?  Yes.    BP 133/81   Pulse (!) 56   Temp (!) 97 F (36.1 C)   Resp 18   Ht 5' (1.524 m)   Wt 106 lb 6.4 oz (48.3 kg)   SpO2 100%   BMI 20.78 kg/m

## 2020-08-16 NOTE — Progress Notes (Signed)
I connected with [name] on 08/16/20 at 10:00 AM EST by telephone visit and verified that I am speaking with the correct person using two identifiers.  I discussed the limitations, risks, security and privacy concerns of performing an evaluation and management service by telemedicine and the availability of in-person appointments. I also discussed with the patient that there may be a patient responsible charge related to this service. The patient expressed understanding and agreed to proceed.  Other persons participating in the visit and their role in the encounter:  daughter  Patient's location:  Home  Provider's location:  Office  Chief Complaint:  Brain metastasis [facility])  History of Present Ilness: Hannah [name] and her daughter describe clear improvement in speech and right sided weakness since initiating the decadron 4mg  each day.  She denies new or progressive symptoms, no headaches.  She is set up for pre-op SRS and surgery next week with Tammi Klippel and [place], after path returned adenocarcinoma. Observations: Language and cognition at baseline. Assessment and Plan: Brain metastasis ([place])  Clinically improved, recommend moving forward with SRS+craniotomy next week.  Should decrease decadron to 2mg  daily if tolerated, taper can be completed post-operatively  Follow Up Instructions: Will stop by and see her in hospital after resection. Otherwise RTC in 3 months after post-craniotomy MRI.  I discussed the assessment and treatment plan with the patient.  The patient was provided an opportunity to ask questions and all were answered.  The patient agreed with the plan and demonstrated understanding of the instructions.    The patient was advised to call back or seek an in-person evaluation if the symptoms worsen or if the condition fails to improve as anticipated.  I provided 5-10 minutes of non-face-to-face time during this enocunter.  Ventura Sellers, MD   I provided 15 minutes of  non face-to-face telephone visit time during this encounter, and > 50% was spent counseling as documented under my assessment & plan.

## 2020-08-16 NOTE — Progress Notes (Signed)
  Radiation Oncology         ([phone]) [phone] ________________________________  Name: Hannah [name] MRN: [id number]  Date: 08/16/2020  DOB: [birth year]  SIMULATION AND TREATMENT PLANNING NOTE    ICD-10-CM   1. Brain metastasis ([place])  C79.31   2. 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  Identity was confirmed.  All relevant records and images related to the planned course of therapy were reviewed.  The patient freely provided informed written consent to proceed with treatment after reviewing the details related to the planned course of therapy. The consent form was witnessed and verified by the simulation staff. Intravenous access was established for contrast administration. Then, the patient was set-up in a stable reproducible supine position for radiation therapy.  A relocatable thermoplastic stereotactic head frame was fabricated for precise immobilization.  CT images were obtained.  Surface markings were placed.  The CT images were loaded into the planning software and fused with the patient's targeting MRI scan.  Then the target and avoidance structures were contoured.  Treatment planning then occurred.  The radiation prescription was entered and confirmed.  I have requested 3D planning  I have requested a DVH of the following structures: Brain stem, brain, left eye, right eye, lenses, optic chiasm, target volumes, uninvolved brain, and normal tissue.    SPECIAL TREATMENT PROCEDURE:  The planned course of therapy using radiation constitutes a special treatment procedure. Special care is required in the management of this patient for the following reasons. This treatment constitutes a Special Treatment Procedure for the following reason: High  dose per fraction requiring special monitoring for increased toxicities of treatment including daily imaging.  The special nature of the planned course of radiotherapy will require increased physician supervision and oversight to ensure patient's safety with optimal treatment outcomes.  PLAN:  The patient will receive 18 Gy in 1 fraction on 08/23/20 to be followed by planned brain met resection on 08/24/20 with Dr. Zada Finders.  ________________________________  Sheral Apley Tammi Klippel, M.D.

## 2020-08-17 ENCOUNTER — Ambulatory Visit (HOSPITAL_COMMUNITY): Payer: Medicare HMO

## 2020-08-17 ENCOUNTER — Other Ambulatory Visit: Payer: Self-pay

## 2020-08-17 ENCOUNTER — Telehealth: Payer: Self-pay | Admitting: Internal Medicine

## 2020-08-17 ENCOUNTER — Observation Stay (HOSPITAL_COMMUNITY)
Admission: EM | Admit: 2020-08-17 | Discharge: 2020-08-19 | Disposition: A | Discharge status: Home / Self Care | Payer: Medicare HMO | Attending: Internal Medicine | Admitting: Internal Medicine

## 2020-08-17 ENCOUNTER — Encounter (HOSPITAL_COMMUNITY): Payer: Self-pay | Admitting: Emergency Medicine

## 2020-08-17 ENCOUNTER — Emergency Department (HOSPITAL_COMMUNITY): Payer: Medicare HMO

## 2020-08-17 DIAGNOSIS — K921 Melena: Principal | ICD-10-CM | POA: Diagnosis present

## 2020-08-17 DIAGNOSIS — D62 Acute posthemorrhagic anemia: Secondary | ICD-10-CM | POA: Diagnosis present

## 2020-08-17 DIAGNOSIS — Z87891 Personal history of nicotine dependence: Secondary | ICD-10-CM | POA: Diagnosis not present

## 2020-08-17 DIAGNOSIS — C7931 Secondary malignant neoplasm of brain: Secondary | ICD-10-CM | POA: Diagnosis not present

## 2020-08-17 DIAGNOSIS — I10 Essential (primary) hypertension: Secondary | ICD-10-CM | POA: Diagnosis present

## 2020-08-17 DIAGNOSIS — Z7982 Long term (current) use of aspirin: Secondary | ICD-10-CM | POA: Diagnosis not present

## 2020-08-17 DIAGNOSIS — K219 Gastro-esophageal reflux disease without esophagitis: Secondary | ICD-10-CM | POA: Diagnosis present

## 2020-08-17 DIAGNOSIS — E119 Type 2 diabetes mellitus without complications: Secondary | ICD-10-CM | POA: Diagnosis not present

## 2020-08-17 DIAGNOSIS — C3411 Malignant neoplasm of upper lobe, right bronchus or lung: Secondary | ICD-10-CM | POA: Diagnosis present

## 2020-08-17 DIAGNOSIS — K625 Hemorrhage of anus and rectum: Secondary | ICD-10-CM | POA: Diagnosis not present

## 2020-08-17 DIAGNOSIS — Z96653 Presence of artificial knee joint, bilateral: Secondary | ICD-10-CM | POA: Insufficient documentation

## 2020-08-17 DIAGNOSIS — C3490 Malignant neoplasm of unspecified part of unspecified bronchus or lung: Secondary | ICD-10-CM | POA: Diagnosis present

## 2020-08-17 DIAGNOSIS — Z20822 Contact with and (suspected) exposure to covid-19: Secondary | ICD-10-CM | POA: Insufficient documentation

## 2020-08-17 DIAGNOSIS — I700 Atherosclerosis of aorta: Secondary | ICD-10-CM | POA: Diagnosis not present

## 2020-08-17 DIAGNOSIS — D496 Neoplasm of unspecified behavior of brain: Secondary | ICD-10-CM | POA: Diagnosis not present

## 2020-08-17 DIAGNOSIS — E1169 Type 2 diabetes mellitus with other specified complication: Secondary | ICD-10-CM | POA: Diagnosis present

## 2020-08-17 DIAGNOSIS — Z79899 Other long term (current) drug therapy: Secondary | ICD-10-CM | POA: Diagnosis not present

## 2020-08-17 DIAGNOSIS — K8020 Calculus of gallbladder without cholecystitis without obstruction: Secondary | ICD-10-CM | POA: Diagnosis not present

## 2020-08-17 DIAGNOSIS — K5730 Diverticulosis of large intestine without perforation or abscess without bleeding: Secondary | ICD-10-CM | POA: Diagnosis not present

## 2020-08-17 DIAGNOSIS — K5900 Constipation, unspecified: Secondary | ICD-10-CM | POA: Diagnosis not present

## 2020-08-17 DIAGNOSIS — M47817 Spondylosis without myelopathy or radiculopathy, lumbosacral region: Secondary | ICD-10-CM | POA: Diagnosis not present

## 2020-08-17 DIAGNOSIS — I739 Peripheral vascular disease, unspecified: Secondary | ICD-10-CM | POA: Diagnosis present

## 2020-08-17 DIAGNOSIS — E782 Mixed hyperlipidemia: Secondary | ICD-10-CM | POA: Diagnosis present

## 2020-08-17 LAB — RESP PANEL BY RT-PCR (FLU A&B, COVID) ARPGX2
Influenza A by PCR: NEGATIVE
Influenza B by PCR: NEGATIVE
SARS Coronavirus 2 by RT PCR: NEGATIVE

## 2020-08-17 LAB — CBC WITH DIFFERENTIAL/PLATELET
Abs Immature Granulocytes: 0.14 10*3/uL — ABNORMAL HIGH (ref 0.00–0.07)
Basophils Absolute: 0 10*3/uL (ref 0.0–0.1)
Basophils Relative: 0 %
Eosinophils Absolute: 0.1 10*3/uL (ref 0.0–0.5)
Eosinophils Relative: 0 %
HCT: 38.4 % (ref 36.0–46.0)
Hemoglobin: 12 g/dL (ref 12.0–15.0)
Immature Granulocytes: 1 %
Lymphocytes Relative: 13 %
Lymphs Abs: 2.5 10*3/uL (ref 0.7–4.0)
MCH: 26.2 pg (ref 26.0–34.0)
MCHC: 31.3 g/dL (ref 30.0–36.0)
MCV: 83.8 fL (ref 80.0–100.0)
Monocytes Absolute: 1.2 10*3/uL — ABNORMAL HIGH (ref 0.1–1.0)
Monocytes Relative: 6 %
Neutro Abs: 14.5 10*3/uL — ABNORMAL HIGH (ref 1.7–7.7)
Neutrophils Relative %: 80 %
Platelets: 236 10*3/uL (ref 150–400)
RBC: 4.58 MIL/uL (ref 3.87–5.11)
RDW: 13.2 % (ref 11.5–15.5)
WBC: 18.4 10*3/uL — ABNORMAL HIGH (ref 4.0–10.5)
nRBC: 0 % (ref 0.0–0.2)

## 2020-08-17 LAB — COMPREHENSIVE METABOLIC PANEL
ALT: 15 U/L (ref 0–44)
AST: 23 U/L (ref 15–41)
Albumin: 4.1 g/dL (ref 3.5–5.0)
Alkaline Phosphatase: 44 U/L (ref 38–126)
Anion gap: 12 (ref 5–15)
BUN: 24 mg/dL — ABNORMAL HIGH (ref 8–23)
CO2: 30 mmol/L (ref 22–32)
Calcium: 10.2 mg/dL (ref 8.9–10.3)
Chloride: 91 mmol/L — ABNORMAL LOW (ref 98–111)
Creatinine, Ser: 0.78 mg/dL (ref 0.44–1.00)
GFR, Estimated: >60 mL/min (ref >60)
Glucose, Bld: 167 mg/dL — ABNORMAL HIGH (ref 70–99)
Potassium: 3.9 mmol/L (ref 3.5–5.1)
Sodium: 133 mmol/L — ABNORMAL LOW (ref 135–145)
Total Bilirubin: 0.4 mg/dL (ref 0.3–1.2)
Total Protein: 7.1 g/dL (ref 6.5–8.1)

## 2020-08-17 LAB — TYPE AND SCREEN
ABO/RH(D): B POS
Antibody Screen: NEGATIVE

## 2020-08-17 LAB — POC OCCULT BLOOD, ED: Fecal Occult Bld: POSITIVE — AB

## 2020-08-17 MED ORDER — SODIUM CHLORIDE 0.9 % IV BOLUS
1000.0000 mL | Freq: Once | INTRAVENOUS | Status: AC
  Administered 2020-08-17: 1000 mL via INTRAVENOUS

## 2020-08-17 MED ORDER — ONDANSETRON HCL 4 MG PO TABS: 4.0000 mg | ORAL_TABLET | Freq: Four times a day (QID) | ORAL | Status: DC | PRN

## 2020-08-17 MED ORDER — IOHEXOL 300 MG/ML  SOLN
100.0000 mL | Freq: Once | INTRAMUSCULAR | Status: AC | PRN
  Administered 2020-08-17: 100 mL via INTRAVENOUS

## 2020-08-17 MED ORDER — SODIUM CHLORIDE 0.9 % IV SOLN: INTRAVENOUS | Status: AC

## 2020-08-17 MED ORDER — ONDANSETRON HCL 4 MG/2ML IJ SOLN: 4.0000 mg | Freq: Four times a day (QID) | INTRAMUSCULAR | Status: DC | PRN

## 2020-08-17 NOTE — Progress Notes (Signed)
[place] OFFICE PROGRESS NOTE  [name], MD [place] [id number]  DIAGNOSIS: Stage IV non-small cell lung cancer (T, N0, M1C) adenocarcinoma.  The patient presented with a and solitary brain metastasis.  The patient was diagnosed in February 2022.  PRIOR THERAPY: None  CURRENT THERAPY: 1) SRS to the metastatic brain lesion on 08/23/2020 under the care of Dr. Tammi Klippel and craniotomy under the care of Dr. Zada Finders scheduled for 08/24/2020.   INTERVAL HISTORY: Hannah [name] 70 y.o. female returns to the clinic today for a follow-up visit accompanied by her daughter.  The patient was recently found to have a solitary lung mass without any lymphadenopathy and a solitary brain metastasis.  The patient was seen by radiation oncology and neurosurgery for the solitary brain metastasis.  The patient will be undergoing SRS to the brain lesion on 08/23/2020 and resection/craniotomy on 08/24/20 under the care of Dr. Venetia Constable.  In the interval, the patient presented to the ER with rectal bleeding. She was in the hospital from 2/18-2/20/22. She is on a bowel regimen for her constipation.   The patient is also followed by Dr. Mickeal Skinner from neuro-oncology regarding her neurologic concerns related to the metastatic disease to the brain.  She has been on Decadron and her language and cognition have returned to baseline.  She notes improvement in her right hand weakness.  Otherwise the patient denies any recent fever, chills or night sweats. She is interested in meeting with a member of the nutritionist team for her weight loss. She denies any chest pain, shortness of breath, cough, or hemoptysis. The patient denies any nausea, vomiting, or diarrhea.  She denies any headache or visual changes. In the interval, the patient had a staging PET scan and CT-guided biopsy of the lung lesion in which the pathology was consistent with non-small cell lung cancer, adenocarcinoma.  The  patient is here today to review her PET scan and for more detailed discussion about the recommended treatment options for her lung mass.   MEDICAL HISTORY: Past Medical History:  Diagnosis Date  . Allergy   . Anemia   . Anxiety   . Arthritis   . Carpal tunnel syndrome   . Constipation    senna C stool softeners help   . Depression   . Diverticulosis   . Dyslipidemia   . External hemorrhoids   . GERD (gastroesophageal reflux disease)   . Heart murmur    mild-moderate AR  . Hiatal hernia   . Hyperlipidemia    on meds   . Hypertension   . Internal hemorrhoids   . Osteoarthritis   . Pre-diabetes   . PVD (peripheral vascular disease) (HCC)    moderate carotid disease  . RBBB   . Smoker   . Vocal cord polyps     ALLERGIES:  is allergic to amlodipine, chantix [varenicline tartrate], clarithromycin, lisinopril, simvastatin, wellbutrin [bupropion hcl], lipitor [atorvastatin], and sertraline.  MEDICATIONS:  Current Outpatient Medications  Medication Sig Dispense Refill  . ALPRAZolam (XANAX) 0.5 MG tablet Take 1 tablet (0.5 mg total) by mouth 2 (two) times daily as needed. for sleep (Patient taking differently: Take 0.25 mg by mouth 2 (two) times daily as needed. for sleep) 60 tablet 5  . Ascorbic Acid (VITAMIN C PO) Take 1,000 mg by mouth daily.    Marland Kitchen aspirin 81 MG tablet Take 1 tablet (81 mg total) by mouth daily. 30 tablet   . calcium citrate-vitamin D (CITRACAL+D) 315-200  MG-UNIT tablet Take 1 tablet by mouth daily.    . carboxymethylcellulose (REFRESH PLUS) 0.5 % SOLN Place 1-2 drops into both eyes daily.    Marland Kitchen dexamethasone (DECADRON) 2 MG tablet Take 1 tablet (2 mg total) by mouth daily. Take with food.    . fenofibrate 160 MG tablet Take 1 tablet (160 mg total) by mouth daily. 90 tablet 3  . hydrochlorothiazide (HYDRODIURIL) 12.5 MG tablet Take 1 tablet (12.5 mg total) by mouth daily. 90 tablet 1  . magnesium hydroxide (MILK OF MAGNESIA) 400 MG/5ML suspension Take 30 mLs by  mouth daily as needed for mild constipation.    . mirtazapine (REMERON) 15 MG tablet Take 0.5 tablets (7.5 mg total) by mouth at bedtime. 15 tablet 3  . naphazoline-glycerin (CLEAR EYES REDNESS) 0.012-0.2 % SOLN Place 1-2 drops into both eyes 4 (four) times daily as needed for eye irritation. bausch and lomb    . omeprazole (PRILOSEC) 40 MG capsule TAKE 1 CAPSULE TWICE DAILY BEFORE MEALS (Patient taking differently: Take 40 mg by mouth in the morning and at bedtime. TAKE 1 CAPSULE TWICE DAILY BEFORE MEALS) 180 capsule 1  . pravastatin (PRAVACHOL) 40 MG tablet TAKE 1 TABLET EVERY EVENING (Patient taking differently: Take 40 mg by mouth daily.) 90 tablet 1  . Probiotic Product (PROBIOTIC DAILY PO) Take 1 capsule by mouth daily.    . Wheat Dextrin (BENEFIBER) POWD Take 2 Scoops by mouth 3 (three) times daily as needed.     No current facility-administered medications for this visit.    SURGICAL HISTORY:  Past Surgical History:  Procedure Laterality Date  . ABDOMINAL HYSTERECTOMY  1994  . COLONOSCOPY    . COLONOSCOPY W/ POLYPECTOMY  2009  . HEMORRHOID SURGERY    . JOINT REPLACEMENT    . POLYPECTOMY    . right total knee arthroplasty     Dr. Emeterio Reeve 06-04-18  . SPINE SURGERY  08/16/2009  . TOTAL KNEE ARTHROPLASTY Left 02/13/2014   Procedure: TOTAL KNEE ARTHROPLASTY;  Surgeon: Alta Corning, MD;  Location: [facility];  Service: Orthopedics;  Laterality: Left;  . TOTAL KNEE ARTHROPLASTY Right 06/04/2018   Procedure: RIGHT TOTAL KNEE ARTHROPLASTY;  Surgeon: Dorna Leitz, MD;  Location: [facility];  Service: Orthopedics;  Laterality: Right;  Adductor Block  . TUBAL LIGATION      REVIEW OF SYSTEMS:   Constitutional: Positive for fatigue, weight loss, and appetite change. Negative for chills and fever. HENT: Negative for mouth sores, nosebleeds, sore throat and trouble swallowing.   Eyes: Negative for eye problems and icterus.  Respiratory: Negative for cough, hemoptysis, shortness of breath and wheezing.    Cardiovascular: Negative for chest pain and leg swelling.  Gastrointestinal: Positive for constipation. Negative for abdominal pain, diarrhea, nausea and vomiting.  Genitourinary: Negative for bladder incontinence, difficulty urinating, dysuria, frequency and hematuria.   Musculoskeletal: Negative for back pain, gait problem, neck pain and neck stiffness.  Skin: Negative for itching and rash.  Neurological: Positive for right hand weakness (improved near baseline). Positive for expressive speech changes (improved close to baseline). Negative for dizziness, gait problem, headaches, light-headedness and seizures.  Hematological: Negative for adenopathy. Does not bruise/bleed easily.  Psychiatric/Behavioral: Negative for confusion, depression and sleep disturbance. The patient is not nervous/anxious.   PHYSICAL EXAMINATION:  There were no vitals taken for this visit.  ECOG PERFORMANCE STATUS: 1 - Symptomatic but completely ambulatory  Physical Exam  Constitutional: Oriented to person, place, and time and thin appearing female and in no distress.  HENT:  Head: Normocephalic and atraumatic.  Mouth/Throat: Oropharynx is clear and moist. No oropharyngeal exudate.  Eyes: Conjunctivae are normal. Right eye exhibits no discharge. Left eye exhibits no discharge. No scleral icterus.  Neck: Normal range of motion. Neck supple.  Cardiovascular: Normal rate, regular rhythm, normal heart sounds and intact distal pulses.   Pulmonary/Chest: Effort normal. Decreased breath sounds in the right lung. No respiratory distress. No wheezes. No rales.  Abdominal: Soft. Bowel sounds are normal. Exhibits no distension and no mass. There is no tenderness.  Musculoskeletal: Normal range of motion. Exhibits no edema.  Lymphadenopathy:    No cervical adenopathy.  Neurological: Alert and oriented to person, place, and time. Exhibits normal muscle weakness. gait normal. Coordination normal.  Skin: Skin is warm and  dry. No rash noted. Not diaphoretic. No erythema. No pallor.  Psychiatric: Mood, memory and judgment normal.  Vitals reviewed.  LABORATORY DATA: Lab Results  Component Value Date   WBC 9.9 08/10/2020   HGB 12.4 08/10/2020   HCT 39.7 08/10/2020   MCV 84.3 08/10/2020   PLT 282 08/10/2020      Chemistry      Component Value Date/Time   NA 137 08/07/2020 0856   K 4.5 08/07/2020 0856   CL 101 08/07/2020 0856   CO2 29 08/07/2020 0856   BUN 26 (H) 08/07/2020 0856   CREATININE 0.92 08/07/2020 0856   CREATININE 0.79 03/14/2020 1016      Component Value Date/Time   CALCIUM 10.4 (H) 08/07/2020 0856   ALKPHOS 42 08/07/2020 0856   AST 17 08/07/2020 0856   ALT 11 08/07/2020 0856   BILITOT 0.3 08/07/2020 0856       RADIOGRAPHIC STUDIES:  MR Brain Wo Contrast  Result Date: 07/31/2020 CLINICAL DATA:  Memory changes. Memory loss. Additional history provided by scanning technologist: Memory changes, right-sided weakness, worsening over the past 2 weeks. EXAM: MRI HEAD WITHOUT CONTRAST TECHNIQUE: Multiplanar, multiecho pulse sequences of the brain and surrounding structures were obtained without intravenous contrast. COMPARISON:  Brain MRI 01/10/2019. FINDINGS: Brain: Cerebral volume is normal for age. There is a 1.5 cm lesion within the left superior frontal gyrus which is predominantly T2 hyperintense and T1 hypointense. There is corresponding restricted diffusion. Prominent surrounding vasogenic edema within the left frontal lobe and extending slightly into the corpus callosum. Associated mass effect with early rightward subfalcine herniation (series 12, image 17). 3 mm rightward midline shift at the level of the septum pellucidum. Partial effacement of the left lateral ventricle. Background mild multifocal T2/FLAIR hyperintensity within the cerebral white matter is nonspecific, but compatible with chronic small vessel ischemic disease. There is no acute infarct. No chronic intracranial blood  products. No extra-axial fluid collection. No midline shift. Vascular: Expected proximal arterial flow voids. Skull and upper cervical spine: No focal marrow lesion. Sinuses/Orbits: Visualized orbits show no acute finding. Trace ethmoid sinus mucosal thickening. These results were called by telephone at the time of interpretation on 07/31/2020 at 8:39 am to provider [place] , who verbally acknowledged these results. IMPRESSION: 1.5 cm lesion within the left superior frontal gyrus with associated restricted diffusion and prominent surrounding vasogenic edema. This is favored to reflect a metastatic lesion. However, a brain abscess cannot be excluded. Postcontrast MR imaging of the brain is recommended for further evaluation. Associated mass effect with early rightward subfalcine herniation. 3 mm rightward midline shift at the level of the septum pellucidum. Partial effacement of the left lateral ventricle. Mild cerebral white matter chronic small vessel ischemic  disease. Electronically Signed   By: Kellie Simmering DO   On: 07/31/2020 08:40   MR BRAIN W CONTRAST  Result Date: 08/02/2020 CLINICAL DATA:  Brain mass or lesion. EXAM: MRI HEAD WITH CONTRAST TECHNIQUE: Multiplanar, multiecho pulse sequences of the brain and surrounding structures were obtained with intravenous contrast. CONTRAST:  4mL MULTIHANCE GADOBENATE DIMEGLUMINE 529 MG/ML IV SOLN COMPARISON:  MRI of the brain July 31, 2020 FINDINGS: Postcontrast images of the brain show a lobulated left frontal lesion with necrotic center and irregular peripheral contrast enhancement measuring approximately 2.2 x 1.8 x 1.4 cm. Sulcal contrast enhancement is also noted posterior to the lesion (series 3, image 14). Prominent surrounding edema with a 3-4 mm rightward midline shift are again noted. No other enhancing lesion identified. IMPRESSION: Rim enhancing necrotic lesion in the left frontal lobe. Differential diagnosis remains the same and favors metastatic  disease. Abscess remains a consideration given restricted diffusion seen on prior MRI. Electronically Signed   By: Pedro Earls M.D.   On: 08/02/2020 13:02   MR Brain W Wo Contrast  Result Date: 08/15/2020 CLINICAL DATA:  SRS treatment planning. Necrotic left frontal lesion. EXAM: MRI HEAD WITHOUT AND WITH CONTRAST TECHNIQUE: Multiplanar, multiecho pulse sequences of the brain and surrounding structures were obtained without and with intravenous contrast. CONTRAST:  37mL MULTIHANCE GADOBENATE DIMEGLUMINE 529 MG/ML IV SOLN COMPARISON:  Brain MRI 08/02/2020 and 07/31/2020 FINDINGS: Brain: Contrast-enhancing lesion in the left frontal lobe is unchanged in size measuring 1.8 x 1.3 cm (series 12, image 123). The degree of vasogenic edema surrounding the lesion has slightly decreased. There are no new lesions. Otherwise, there are scattered hyperintense T2-weighted signal foci within the white matter suggestive of chronic small vessel disease. Brain volume is normal for age. No acute or chronic hemorrhage. Vascular: Normal flow voids. Skull and upper cervical spine: Normal marrow signal. Sinuses/Orbits: Negative. Other: None. IMPRESSION: Unchanged size of left frontal lobe contrast-enhancing lesion with slightly decreased surrounding vasogenic edema. Electronically Signed   By: Ulyses Jarred M.D.   On: 08/15/2020 19:49   CT CHEST ABDOMEN PELVIS W CONTRAST  Result Date: 08/02/2020 CLINICAL DATA:  New brain lesion on MRI.  Weight loss. EXAM: CT CHEST, ABDOMEN, AND PELVIS WITH CONTRAST TECHNIQUE: Multidetector CT imaging of the chest, abdomen and pelvis was performed following the standard protocol during bolus administration of intravenous contrast. CONTRAST:  120mL OMNIPAQUE IOHEXOL 300 MG/ML  SOLN COMPARISON:  None. FINDINGS: CT CHEST FINDINGS Cardiovascular: Normal heart size. No significant pericardial effusion/thickening. Left anterior descending and left circumflex coronary atherosclerosis.  Atherosclerotic nonaneurysmal thoracic aorta. Normal caliber pulmonary arteries. No central pulmonary emboli. Mediastinum/Nodes: Subcentimeter hypodense right thyroid nodule. Not clinically significant; no follow-up imaging recommended (ref: J Am Coll Radiol. 2015 Feb;12(2): 143-50). Unremarkable esophagus. No pathologically enlarged axillary, mediastinal or hilar lymph nodes. Lungs/Pleura: No pneumothorax. No pleural effusion. Irregular solid 5.4 x 4.4 cm peripheral right upper lobe lung mass abutting and mildly distorting the minor fissure (series 3/image 40). Tiny solid 0.2 cm lingular pulmonary nodule (series 3/image 70). No additional significant pulmonary nodules. Mild centrilobular and paraseptal emphysema. Musculoskeletal: No aggressive appearing focal osseous lesions. Moderate thoracic spondylosis. CT ABDOMEN PELVIS FINDINGS Hepatobiliary: Normal liver with no liver mass. Cholelithiasis. Contracted gallbladder with no definite gallbladder wall thickening. No biliary ductal dilatation. Pancreas: Normal, with no mass or duct dilation. Spleen: Normal size. No mass. Adrenals/Urinary Tract: Normal adrenals. Normal kidneys with no hydronephrosis and no renal mass. Normal bladder. Stomach/Bowel: Normal non-distended stomach. Normal caliber  small bowel with no small bowel wall thickening. Normal appendix. Oral contrast transits to the colon. Normal large bowel with no diverticulosis, large bowel wall thickening or pericolonic fat stranding. Vascular/Lymphatic: Atherosclerotic abdominal aorta with ectatic 2.6 cm infrarenal abdominal aorta. Patent portal, splenic, hepatic and renal veins. No pathologically enlarged lymph nodes in the abdomen or pelvis. Reproductive: Status post hysterectomy, with no abnormal findings at the vaginal cuff. No adnexal mass. Other: No pneumoperitoneum, ascites or focal fluid collection. Musculoskeletal: No aggressive appearing focal osseous lesions. Moderate multilevel lumbar  degenerative disc disease. Interbody spacers at L3-4 and L4-5. Surgical hardware between the L3-4 and L4-5 spinous processes. IMPRESSION: 1. Irregular solid 5.4 cm peripheral right upper lobe lung mass abutting and mildly distorting the minor fissure, compatible with primary bronchogenic carcinoma. Multi disciplinary thoracic oncology consultation suggested. 2. Tiny 0.2 cm lingular pulmonary nodule, for which follow-up chest CT is recommended in 3 months. 3. No additional potential findings of metastatic disease in the chest, abdomen or pelvis. No thoracic adenopathy. 4. Two-vessel coronary atherosclerosis. 5. Cholelithiasis. 6. Ectatic 2.6 cm infrarenal abdominal aorta. Recommend follow-up ultrasound every 5 years. This recommendation follows ACR consensus guidelines: White Paper of the ACR Incidental Findings Committee II on Vascular Findings. J Am Coll Radiol 2013; 10:789-794. 7. Aortic Atherosclerosis (ICD10-I70.0) and Emphysema (ICD10-J43.9). Electronically Signed   By: Ilona Sorrel M.D.   On: 08/02/2020 17:37   NM PET Image Initial (PI) Skull Base To Thigh  Result Date: 08/08/2020 CLINICAL DATA:  Initial treatment strategy for recently diagnosed right upper lobe lung cancer. Intracranial metastatic disease. EXAM: NUCLEAR MEDICINE PET SKULL BASE TO THIGH TECHNIQUE: 5.2 mCi F-18 FDG was injected intravenously. Full-ring PET imaging was performed from the skull base to thigh after the radiotracer. CT data was obtained and used for attenuation correction and anatomic localization. Fasting blood glucose: 87 mg/dl COMPARISON:  Chest CT 08/02/2020. FINDINGS: Mediastinal blood pool activity: SUV max 2.4 NECK: No hypermetabolic cervical lymph nodes are identified.There is asymmetric hypermetabolic activity in the region of the right arytenoid cartilage (SUV max 6.4). No focal corresponding mucosal abnormality identified on the CT images. No other lesions of the pharyngeal mucosal space. Incidental CT findings:  Bilateral carotid atherosclerosis. CHEST: There are no hypermetabolic mediastinal, hilar or axillary lymph nodes. The previously demonstrated large peripheral right upper lobe mass demonstrates peripheral hypermetabolic activity with an SUV max of 11.0. This lesion is centrally necrotic with areas of fluid density and gas. It measures approximately 5.3 x 4.3 cm on image 26/8. The lesion extends to the visceral pleural surface, although demonstrates no chest wall invasion. Tiny lingular nodule on image 38/8 is unchanged. Incidental CT findings: Mild centrilobular and paraseptal emphysema. Mild atherosclerosis of the aorta, great vessels and coronary arteries. ABDOMEN/PELVIS: There is no hypermetabolic activity within the liver, adrenal glands, spleen or pancreas. There is no hypermetabolic nodal activity. There is nonspecific low-level and erectile activity (SUV max 6.2). No other abnormal bowel activity. Incidental CT findings: Calcified gallstones. Aortic and branch vessel atherosclerosis. A degree of rectal prolapse is suspected. SKELETON: There is no hypermetabolic activity to suggest osseous metastatic disease. Incidental CT findings: none IMPRESSION: 1. The previously demonstrated large right upper lobe mass is peripherally hypermetabolic, consistent with bronchogenic carcinoma. This lesion demonstrates progressive central necrosis. 2. No evidence of metastatic disease. By imaging, this corresponds with stage II B disease (T3 N0 M0). 3. Nonspecific asymmetric activity associated with the arytenoid cartilage, potentially physiologic. Correlate with direct visualization if there are symptoms referable to this  region. 4. Nonspecific anorectal activity, suspected to be related to a degree of rectal prolapse. Correlate clinically. 5. Incidental findings as previously described, including cholelithiasis and Aortic Atherosclerosis (ICD10-I70.0). Electronically Signed   By: Richardean Sale M.D.   On: 08/08/2020 16:06    CT Biopsy  Result Date: 08/10/2020 INDICATION: Right upper lobe pulmonary mass with brain Mets concerning for metastatic lung disease. She presents for CT-guided biopsy of the lung mass. EXAM: CT-guided biopsy right upper lobe pulmonary nodule Interventional Radiologist:  Criselda Peaches, MD MEDICATIONS: None. ANESTHESIA/SEDATION: Fentanyl 50 mcg IV; Versed 1.5 mg IV Moderate Sedation Time:  15 minutes The patient was continuously monitored during the procedure by the interventional radiology nurse under my direct supervision. FLUOROSCOPY TIME:  None. COMPLICATIONS: None immediate. Estimated blood loss:  0 PROCEDURE: Informed written consent was obtained from the patient after a thorough discussion of the procedural risks, benefits and alternatives. All questions were addressed. Maximal Sterile Barrier Technique was utilized including caps, mask, sterile gowns, sterile gloves, sterile drape, hand hygiene and skin antiseptic. A timeout was performed prior to the initiation of the procedure. A planning axial CT scan was performed. The nodule in the right upper lobe was successfully identified. A suitable skin entry site was selected and marked. The region was then sterilely prepped and draped in standard fashion using Betadine skin prep. Local anesthesia was attained by infiltration with 1% lidocaine. A small dermatotomy was made. Under intermittent CT fluoroscopic guidance, a 17 gauge trocar needle was advanced into the lung and positioned at the margin of the nodule. Multiple 18 gauge core biopsies were then coaxially obtained using the BioPince automated biopsy device. Biopsy specimens were placed in formalin and delivered to pathology for further analysis. The biopsy device and introducer needle were removed. A bio sentry device was deployed. Post biopsy axial CT imaging demonstrates no evidence of immediate complication. There is no pneumothorax. The patient tolerated the procedure well. IMPRESSION:  Technically successful CT-guided biopsy right upper lobe pulmonary nodule. Electronically Signed   By: Jacqulynn Cadet M.D.   On: 08/10/2020 14:51   DG Chest Port 1 View  Result Date: 08/10/2020 CLINICAL DATA:  Status post lung biopsy EXAM: PORTABLE CHEST 1 VIEW COMPARISON:  Chest radiograph May 18, 2018; PET-CT August 08, 2020 FINDINGS: No pneumothorax. Right upper lobe mass again noted measuring 4.6 x 3.9 cm. Lungs elsewhere clear. Heart size and pulmonary vascularity are normal. No adenopathy. No bone lesions. IMPRESSION: No pneumothorax. Essentially stable mass right upper lobe. Lungs elsewhere clear. Cardiac silhouette within normal limits. Electronically Signed   By: Lowella Grip III M.D.   On: 08/10/2020 11:05     ASSESSMENT/PLAN:  This is a very pleasant 70 year old African-American female diagnosed with stage IV (T3, N0, M1c) non-small cell lung cancer, adenocarcinoma.  The patient presented with a right upper lobe lung mass and a solitary brain metastasis.  The patient was diagnosed in February 2022.  Radiation oncology, neurosurgery, and neuro-oncology recommend SRS to the metastatic brain lesion and a craniotomy.  The patient is scheduled for SRS on 08/23/2020 under the care of Dr. Tammi Klippel and resection under the care of Dr. Zada Finders on 08/24/2020.  The patient was seen with Dr. Julien Nordmann today. Dr. Julien Nordmann reviewed the results of the PET scan which showed large right upper lobe mass is peripherally hypermetabolic, consistent with bronchogenic carcinoma. No evidence of metastatic disease.  The patient's recent PET scan was discussed at the multidisciplinary thoracic conference. Given the absence of any lymphadenopathy or  other signs of metastatic disease Dr. Julien Nordmann believes this patient may be a candidate for surgical resection of the primary lung lesion. We will refer the patient to cardiothoracic surgery for consideration of surgical resection of the right upper lobe lung  mass. We may need to consider adjuvant or neoadjuvant chemotherapy depending on his evaluation with surgery and if she is a candidate for section at this time.   I will arrange for a referral to CT surgery. I also referred the patient to the [facility] nutritionist per patient request for her weight loss and decreased appetite.   We will see her back for a follow up visit in 1 month for evaluation.   The patient was advised to call immediately if she has any concerning symptoms in the interval. The patient voices understanding of current disease status and treatment options and is in agreement with the current care plan. All questions were answered. The patient knows to call the clinic with any problems, questions or concerns. We can certainly see the patient much sooner if necessary  No orders of the defined types were placed in this encounter.    I spent 30-39 minutes in this encounter.   Sigifredo Pignato L Keefe Zawistowski, PA-C 08/17/20  ADDENDUM: Hematology/Oncology Attending: I had a face-to-face encounter with the patient today.  I reviewed her records, scan and recommended her care plan.  This is a very pleasant 70 years old African-American female recently diagnosed with a stage IV (T3, N0, M1 C) non-small cell lung cancer, adenocarcinoma presented with large right upper lobe lung mass as well as large solitary brain metastasis diagnosed in February 2022.  The patient underwent a PET scan recently that showed no concerning finding for other metastatic disease outside the right upper lobe lung mass. She is scheduled to have SRS to the solitary brain metastasis followed by surgical resection later this week. I had a lengthy discussion with the patient today about her condition and I recommended for her to see cardiothoracic surgery after her brain surgery for consideration of surgical resection of the right upper lobe lung mass.  The patient understand that this is not the standard treatment for stage  IV non-small cell lung cancer but with the solitary lesions in both the brain and lung it is an acceptable option which may give her a better survival with the addition of systemic chemotherapy/immunotherapy in the future. The patient and her daughter agreed to the current plan. We will arrange for her to come back for follow-up visit in around 1 months for reevaluation and more detailed discussion of her treatment options after her surgical resections.  We will consider sending the resected tissue block for molecular studies and PD-L1 expression. The patient was advised to call immediately if she has any other concerning symptoms in the interval.  Disclaimer: This note was dictated with voice recognition software. Similar sounding words can inadvertently be transcribed and may be missed upon review. Eilleen Kempf, MD  08/21/20

## 2020-08-17 NOTE — ED Triage Notes (Signed)
Patient reports significant bleeding from hemorrhoids today.

## 2020-08-17 NOTE — Telephone Encounter (Signed)
Patients daughter called and said that the patient is having a lot of bleeding from Hemorrhoids. She said that there was a lot of blood. Transferred to [facility]

## 2020-08-17 NOTE — ED Provider Notes (Signed)
[facility] DEPT Provider Note   CSN: [id number] Arrival date & time: 08/17/20  1643     History Chief Complaint  Patient presents with  . Rectal Bleeding    Hannah [name] is a 70 y.o. female.  The history is provided by the patient.  Rectal Bleeding Quality:  Bright red Amount:  Moderate Timing:  Intermittent Chronicity:  New Context: hemorrhoids, rectal pain and spontaneously   Pain details:    Quality:  Aching   Severity:  Mild   Timing:  Only with defecation   Progression:  Unchanged Relieved by:  Nothing Worsened by:  Nothing Associated symptoms: no abdominal pain, no fever, no hematemesis and no vomiting   Risk factors: no hx of colorectal cancer (currently diagnosed with lung cancer with brain mets, no treatment started yet. No evidence of colon spread in original workup. Hx of hemorrhoids, has been constipated which is chronic problem. Took enema and milk of mag today)        Past Medical History:  Diagnosis Date  . Allergy   . Anemia   . Anxiety   . Arthritis   . Carpal tunnel syndrome   . Constipation    senna C stool softeners help   . Depression   . Diverticulosis   . Dyslipidemia   . External hemorrhoids   . GERD (gastroesophageal reflux disease)   . Heart murmur    mild-moderate AR  . Hiatal hernia   . Hyperlipidemia    on meds   . Hypertension   . Internal hemorrhoids   . Osteoarthritis   . Pre-diabetes   . PVD (peripheral vascular disease) (H[facility])    moderate carotid disease  . RBBB   . Smoker   . Vocal cord polyps     Patient Active Problem List   Diagnosis Date Noted  . Primary cancer of right upper lobe of lung ([place]) 08/07/2020  . Brain metastasis ([place]) 08/06/2020  . Memory changes 07/24/2020  . Non-recurrent unilateral inguinal hernia without obstruction or gangrene 07/24/2020  . Pain of right clavicle 09/12/2019  . Nonspecific abnormal electrocardiogram (ECG) (EKG) 10/20/2018  . Educated about  COVID-19 virus infection 10/20/2018  . Weight loss 07/17/2018  . Depression 07/17/2018  . Primary osteoarthritis of right knee 06/04/2018  . Bilateral carotid artery stenosis 09/24/2017  . Aortic valve sclerosis 09/24/2017  . Vocal cord polyps 09/07/2017  . Dysphagia 09/07/2017  . Diabetes mellitus without complication ([place]) 49/70/2637  . GERD (gastroesophageal reflux disease) 02/27/2016  . Anxiety 02/27/2016  . Osteoarthritis of left knee 02/19/2014  . S/P total knee replacement 02/13/2014  . RBBB 02/08/2014  . HTN (hypertension) 02/08/2014  . PVD - bilateral 60-79% carotid strenosis 02/08/2014  . Mixed hyperlipidemia 11/29/2013  . Carpal tunnel syndrome, bilateral 11/23/2013  . DJD (degenerative joint disease) 11/23/2013  . Murmur- mild -mod AR, mild MR 11/10/2013  . Chronic constipation 12/16/2012  . Arthritis 10/08/2012  . Nicotine abuse 12/29/2011    Past Surgical History:  Procedure Laterality Date  . ABDOMINAL HYSTERECTOMY  1994  . COLONOSCOPY    . COLONOSCOPY W/ POLYPECTOMY  2009  . HEMORRHOID SURGERY    . JOINT REPLACEMENT    . POLYPECTOMY    . right total knee arthroplasty     Dr. Emeterio Reeve 06-04-18  . SPINE SURGERY  08/16/2009  . TOTAL KNEE ARTHROPLASTY Left 02/13/2014   Procedure: TOTAL KNEE ARTHROPLASTY;  Surgeon: Alta Corning, MD;  Location: [facility];  Service: Orthopedics;  Laterality:  Left;  . TOTAL KNEE ARTHROPLASTY Right 06/04/2018   Procedure: RIGHT TOTAL KNEE ARTHROPLASTY;  Surgeon: Dorna Leitz, MD;  Location: [facility];  Service: Orthopedics;  Laterality: Right;  Adductor Block  . TUBAL LIGATION       OB History   No obstetric history on file.     Family History  Problem Relation Age of Onset  . Cancer Father   . Prostate cancer Father   . Diabetes Sister   . Cancer Sister   . Stroke Maternal Grandfather   . Colitis Maternal Aunt   . Breast cancer Maternal Aunt   . Colon cancer Neg Hx   . Colon polyps Neg Hx   . Rectal cancer Neg Hx   . Stomach  cancer Neg Hx   . Esophageal cancer Neg Hx     Social History   Tobacco Use  . Smoking status: Former Smoker    Packs/day: 0.25    Years: 40.00    Pack years: 10.00    Types: Cigarettes    Quit date: 07/09/2020    Years since quitting: 0.1  . Smokeless tobacco: Never Used  . Tobacco comment: PACK WILL LAST 3 days  Vaping Use  . Vaping Use: Never used  Substance Use Topics  . Alcohol use: No  . Drug use: No    Home Medications Prior to Admission medications   Medication Sig Start Date End Date Taking? Authorizing Provider  ALPRAZolam Duanne Moron) 0.5 MG tablet Take 1 tablet (0.5 mg total) by mouth 2 (two) times daily as needed. for sleep Patient taking differently: Take 0.25 mg by mouth 2 (two) times daily as needed. for sleep 03/14/20   [name], MD  Ascorbic Acid (VITAMIN C PO) Take 1,000 mg by mouth daily.    [provider]  aspirin 81 MG tablet Take 1 tablet (81 mg total) by mouth daily. 09/10/18   [name], MD  calcium citrate-vitamin D (CITRACAL+D) 315-200 MG-UNIT tablet Take 1 tablet by mouth daily.    [provider]  carboxymethylcellulose (REFRESH PLUS) 0.5 % SOLN Place 1-2 drops into both eyes daily.    [provider]  dexamethasone (DECADRON) 2 MG tablet Take 1 tablet (2 mg total) by mouth daily. Take with food. 08/16/20   Ventura Sellers, MD  fenofibrate 160 MG tablet Take 1 tablet (160 mg total) by mouth daily. 03/14/20   [name], MD  hydrochlorothiazide (HYDRODIURIL) 12.5 MG tablet Take 1 tablet (12.5 mg total) by mouth daily. 08/08/20   [name], MD  magnesium hydroxide (MILK OF MAGNESIA) 400 MG/5ML suspension Take 30 mLs by mouth daily as needed for mild constipation.    [provider]  mirtazapine (REMERON) 15 MG tablet Take 0.5 tablets (7.5 mg total) by mouth at bedtime. 07/24/20   [name], MD  naphazoline-glycerin (CLEAR EYES REDNESS) 0.012-0.2 % SOLN Place 1-2 drops into both eyes 4 (four) times daily  as needed for eye irritation. bausch and lomb    [provider]  omeprazole (PRILOSEC) 40 MG capsule TAKE 1 CAPSULE TWICE DAILY BEFORE MEALS Patient taking differently: Take 40 mg by mouth in the morning and at bedtime. TAKE 1 CAPSULE TWICE DAILY BEFORE MEALS 02/16/20   Minus Breeding, MD  pravastatin (PRAVACHOL) 40 MG tablet TAKE 1 TABLET EVERY EVENING Patient taking differently: Take 40 mg by mouth daily. 05/28/20   Minus Breeding, MD  Probiotic Product (PROBIOTIC DAILY PO) Take 1 capsule by mouth daily.  [provider]  Wheat Dextrin (BENEFIBER) POWD Take 2 Scoops by mouth 3 (three) times daily as needed.    [provider]    Allergies    Amlodipine, Chantix [varenicline tartrate], Clarithromycin, Lisinopril, Simvastatin, Wellbutrin [bupropion hcl], Lipitor [atorvastatin], and Sertraline  Review of Systems   Review of Systems  Constitutional: Negative for chills and fever.  HENT: Negative for ear pain and sore throat.   Eyes: Negative for pain and visual disturbance.  Respiratory: Negative for cough and shortness of breath.   Cardiovascular: Negative for chest pain and palpitations.  Gastrointestinal: Positive for blood in stool, constipation, hematochezia and rectal pain. Negative for abdominal pain, hematemesis and vomiting.  Genitourinary: Negative for dysuria and hematuria.  Musculoskeletal: Negative for arthralgias and back pain.  Skin: Negative for color change and rash.  Neurological: Negative for seizures and syncope.  All other systems reviewed and are negative.   Physical Exam Updated Vital Signs  ED Triage Vitals [08/17/20 1649]  Enc Vitals Group     BP 132/79     Pulse Rate 94     Resp 18     Temp (!) 97.4 F (36.3 C)     Temp Source Oral     SpO2 99 %     Weight      Height      Head Circumference      Peak Flow      Pain Score      Pain Loc      Pain Edu?      Excl. in [place]?     Physical Exam Vitals and nursing note  reviewed.  Constitutional:      General: She is not in acute distress.    Appearance: She is well-developed and well-nourished. She is not ill-appearing.  HENT:     Head: Normocephalic and atraumatic.     Mouth/Throat:     Mouth: Mucous membranes are moist.  Eyes:     Extraocular Movements: Extraocular movements intact.     Conjunctiva/sclera: Conjunctivae normal.     Pupils: Pupils are equal, round, and reactive to light.  Cardiovascular:     Rate and Rhythm: Normal rate and regular rhythm.     Pulses: Normal pulses.     Heart sounds: Normal heart sounds. No murmur heard.   Pulmonary:     Effort: Pulmonary effort is normal. No respiratory distress.     Breath sounds: Normal breath sounds.  Abdominal:     General: There is no distension.     Palpations: Abdomen is soft.     Tenderness: There is no abdominal tenderness.  Genitourinary:    Rectum: Guaiac result positive.     Comments: Some external hemorrhoids on exam but no obvious bleeding from there, patient's depends in covered in bright red blood with some clots, patient did have pain with rectal exam, there was stool ball high up in the rectum but given heavy amount of bleeding exam is stopped Musculoskeletal:        General: No edema.     Cervical back: Neck supple.  Skin:    General: Skin is warm and dry.     Capillary Refill: Capillary refill takes less than 2 seconds.  Neurological:     General: No focal deficit present.     Mental Status: She is alert.  Psychiatric:        Mood and Affect: Mood and affect and mood normal.     ED Results / Procedures /  Treatments   Labs (all labs ordered are listed, but only abnormal results are displayed) Labs Reviewed  CBC WITH DIFFERENTIAL/PLATELET - Abnormal; Notable for the following components:      Result Value   WBC 18.4 (*)    Neutro Abs 14.5 (*)    Monocytes Absolute 1.2 (*)    Abs Immature Granulocytes 0.14 (*)    All other components within normal limits   COMPREHENSIVE METABOLIC PANEL - Abnormal; Notable for the following components:   Sodium 133 (*)    Chloride 91 (*)    Glucose, Bld 167 (*)    BUN 24 (*)    All other components within normal limits  POC OCCULT BLOOD, ED - Abnormal; Notable for the following components:   Fecal Occult Bld POSITIVE (*)    All other components within normal limits  RESP PANEL BY RT-PCR (FLU A&B, COVID) ARPGX2  TYPE AND SCREEN    EKG None  Radiology CT ABDOMEN PELVIS W CONTRAST  Result Date: 08/17/2020 CLINICAL DATA:  Heavy rectal bleeding. EXAM: CT ABDOMEN AND PELVIS WITH CONTRAST TECHNIQUE: Multidetector CT imaging of the abdomen and pelvis was performed using the standard protocol following bolus administration of intravenous contrast. CONTRAST:  147mL OMNIPAQUE IOHEXOL 300 MG/ML  SOLN COMPARISON:  August 02, 2020 FINDINGS: Lower chest: No acute abnormality. Hepatobiliary: There is mild diffuse fatty infiltration of the liver parenchyma. No focal liver abnormality is seen. Ill-defined gallstones are seen within the lumen of a contracted gallbladder. Pancreas: Unremarkable. No pancreatic ductal dilatation or surrounding inflammatory changes. Spleen: Normal in size without focal abnormality. Adrenals/Urinary Tract: Adrenal glands are unremarkable. Kidneys are normal, without renal calculi, focal lesion, or hydronephrosis. The urinary bladder is moderately distended and otherwise normal in appearance. Stomach/Bowel: Stomach is within normal limits. Appendix appears normal. A large amount of stool is seen throughout the colon. This is most prominent within the distal sigmoid colon and rectum. No evidence of bowel wall thickening, distention, or inflammatory changes. Noninflamed diverticula are seen within the descending and sigmoid colon. Vascular/Lymphatic: Aortic atherosclerosis. No enlarged abdominal or pelvic lymph nodes. Reproductive: Status post hysterectomy. No adnexal masses. Other: No abdominal wall hernia  or abnormality. No abdominopelvic ascites. Musculoskeletal: Extensive postoperative changes are seen at the levels of L3-L4 and L4-L5. Marked severity degenerative changes are noted at the level of L5-S1. IMPRESSION: 1. Large stool burden, most prominent within the distal sigmoid colon and rectum. 2. Colonic diverticulosis. 3. Cholelithiasis. 4. Extensive postoperative changes within the lower lumbar spine. 5. Marked severity degenerative changes at the level of L5-S1. 6. Aortic atherosclerosis. Aortic Atherosclerosis (ICD10-I70.0). Electronically Signed   By: Virgina Norfolk M.D.   On: 08/17/2020 20:44    Procedures Procedures   Medications Ordered in ED Medications  sodium chloride 0.9 % bolus 1,000 mL (1,000 mLs Intravenous New Bag/Given 08/17/20 1809)  iohexol (OMNIPAQUE) 300 MG/ML solution 100 mL (100 mLs Intravenous Contrast Given 08/17/20 2021)    ED Course  I have reviewed the triage vital signs and the nursing notes.  Pertinent labs & imaging results that were available during my care of the patient were reviewed by me and considered in my medical decision making (see chart for details).    MDM Rules/Calculators/A&P                          Hannah [name] is a 70 year old female with history of anxiety, depression, hemorrhoids, newly diagnosed lung cancer with brain metastasis yet to  start any treatment who presents the ED with rectal bleeding.  Has had heavy rectal bleeding today.  Multiple episodes while trying to use a commode.  Has had pain.  Has been constipated which is a chronic issue.  She used enema and milk of magnesia today already.  Patient has bright red blood in her underwear on exam.  She has some external hemorrhoids but no active bleeding from any of them.  She has stool ball high up in the rectum but given the amount of blood concern for stercoral colitis or possibly internal hemorrhoid bleeding.  We will get a CT scan to further evaluate.  It appears that she did not  have any colon masses on her cancer work-up.  May be less related.  Dr. Raquel James is her GI doctor who I contacted and he reviewed her colonoscopy from 2 years ago and had some rectal prolapse at that time and suspect that she will likely need a flex sigmoidoscopy.  Bleeding could be from hemorrhoids or possibly stercoral colitis.  We will get basic labs and anticipate admission for further work-up.  Hemoglobin 12.  White count of 18.  Covid test negative.  Otherwise electrolytes unremarkable.  CT scan confirmed large stool burden most prominently within the distal sigmoid and rectum.  Otherwise CT scan unremarkable.  Will admit to medicine for further care.  Hemodynamically stable.  Anticipate likely flex sig tomorrow with GI.  This chart was dictated using voice recognition software.  Despite best efforts to proofread,  errors can occur which can change the documentation meaning.    Final Clinical Impression(s) / ED Diagnoses Final diagnoses:  Rectal bleeding  Constipation, unspecified constipation type    Rx / DC Orders ED Discharge Orders    None       Lennice Sites, DO 08/17/20 2059

## 2020-08-17 NOTE — Telephone Encounter (Signed)
Patient states she has had hemorrhoid surgery in the past, she was dx with brain cancer. She is currently struggling to have a BM and has an impaction and blood coming from her rectum  Patient has went to the ED

## 2020-08-17 NOTE — ED Notes (Signed)
Called report to [place] at [facility]

## 2020-08-17 NOTE — H&P (Signed)
History and Physical    Hannah [name] MPN:[id number] DOB: 04-11-1951 DOA: 08/17/2020  PCP: [name], MD  Patient coming from: Home  I have personally briefly reviewed patient's old medical records in [place]  Chief Complaint: Rectal bleeding  HPI: Hannah [name] is a 70 y.o. female with medical history significant for recently diagnosed stage IV right lung adenocarcinoma with brain metastases, HTN, HLD, RBBB, depression/anxiety, hemorrhoids and diverticulosis who presents to the ED for evaluation of rectal bleeding.  Patient states she has been constipated for the last few days with last bowel movement 4 days ago.  She has tried enemas and milk of magnesia without significant improvement.  She has been noticing large amount of bright red blood per rectum when attempting to have bowel movements.  She has had associated rectal pain.  She denies any similar episodes in the past.  She does take a low-dose aspirin 81 mg daily.  She is not on any other blood thinners.  She otherwise denies any lightheadedness/dizziness, subjective fevers, chills, diaphoresis, nausea, vomiting, abdominal pain, or dysuria.  She has recently diagnosed right lung adenocarcinoma with brain metastases with plans for SRS+ craniotomy next week.  She has been on Decadron 2 mg daily.  ED Course:  Initial vitals showed BP 132/79, pulse 94, RR 18, temp 97.4 F, SPO2 99% on room air.  Labs show WBC 18.4, hemoglobin 12.0, platelets 236,000, sodium 133, potassium 3.9, bicarb 30, BUN 24, creatinine 0.70, LFTs within normal limits, serum glucose 167.  FOBT is positive.  SARS-CoV-2 PCR is negative.  Influenza A/B PCR's are negative.  CT abdomen/pelvis with contrast shows large stool burden most prominent in the distal sigmoid colon and rectum.  Colonic diverticulosis, cholelithiasis also noted.  Patient was given 1 L normal saline.  EDP discussed with on-call GI who recommended medical admission for possible  flexible sigmoidoscopy tomorrow.  The hospitalist service was consulted to admit for further evaluation management.  Review of Systems: All systems reviewed and are negative except as documented in history of present illness above.   Past Medical History:  Diagnosis Date  . Allergy   . Anemia   . Anxiety   . Arthritis   . Carpal tunnel syndrome   . Constipation    senna C stool softeners help   . Depression   . Diverticulosis   . Dyslipidemia   . External hemorrhoids   . GERD (gastroesophageal reflux disease)   . Heart murmur    mild-moderate AR  . Hiatal hernia   . Hyperlipidemia    on meds   . Hypertension   . Internal hemorrhoids   . Osteoarthritis   . Pre-diabetes   . PVD (peripheral vascular disease) (HCC)    moderate carotid disease  . RBBB   . Smoker   . Vocal cord polyps     Past Surgical History:  Procedure Laterality Date  . ABDOMINAL HYSTERECTOMY  1994  . COLONOSCOPY    . COLONOSCOPY W/ POLYPECTOMY  2009  . HEMORRHOID SURGERY    . JOINT REPLACEMENT    . POLYPECTOMY    . right total knee arthroplasty     Dr. Emeterio Reeve 06-04-18  . SPINE SURGERY  08/16/2009  . TOTAL KNEE ARTHROPLASTY Left 02/13/2014   Procedure: TOTAL KNEE ARTHROPLASTY;  Surgeon: Alta Corning, MD;  Location: [facility];  Service: Orthopedics;  Laterality: Left;  . TOTAL KNEE ARTHROPLASTY Right 06/04/2018   Procedure: RIGHT TOTAL KNEE ARTHROPLASTY;  Surgeon: Dorna Leitz, MD;  Location: [facility];  Service: Orthopedics;  Laterality: Right;  Adductor Block  . TUBAL LIGATION      Social History:  reports that she quit smoking about 5 weeks ago. Her smoking use included cigarettes. She has a 10.00 pack-year smoking history. She has never used smokeless tobacco. She reports that she does not drink alcohol and does not use drugs.  Allergies  Allergen Reactions  . Amlodipine     Rash, swelling  . Chantix [Varenicline Tartrate] Other (See Comments)    Tongue swell,sob  . Clarithromycin Rash  .  Lisinopril Hives  . Simvastatin Hives  . Wellbutrin [Bupropion Hcl] Hives  . Lipitor [Atorvastatin]     Dizziness per patient  . Sertraline     Makes her feel like she is going to kill someone    Family History  Problem Relation Age of Onset  . Cancer Father   . Prostate cancer Father   . Diabetes Sister   . Cancer Sister   . Stroke Maternal Grandfather   . Colitis Maternal Aunt   . Breast cancer Maternal Aunt   . Colon cancer Neg Hx   . Colon polyps Neg Hx   . Rectal cancer Neg Hx   . Stomach cancer Neg Hx   . Esophageal cancer Neg Hx      Prior to Admission medications   Medication Sig Start Date End Date Taking? Authorizing Provider  ALPRAZolam Duanne Moron) 0.5 MG tablet Take 1 tablet (0.5 mg total) by mouth 2 (two) times daily as needed. for sleep Patient taking differently: Take 0.25 mg by mouth 2 (two) times daily as needed. for sleep 03/14/20   [name], MD  Ascorbic Acid (VITAMIN C PO) Take 1,000 mg by mouth daily.    [provider]  aspirin 81 MG tablet Take 1 tablet (81 mg total) by mouth daily. 09/10/18   [name], MD  calcium citrate-vitamin D (CITRACAL+D) 315-200 MG-UNIT tablet Take 1 tablet by mouth daily.    [provider]  carboxymethylcellulose (REFRESH PLUS) 0.5 % SOLN Place 1-2 drops into both eyes daily.    [provider]  dexamethasone (DECADRON) 2 MG tablet Take 1 tablet (2 mg total) by mouth daily. Take with food. 08/16/20   Ventura Sellers, MD  fenofibrate 160 MG tablet Take 1 tablet (160 mg total) by mouth daily. 03/14/20   [name], MD  hydrochlorothiazide (HYDRODIURIL) 12.5 MG tablet Take 1 tablet (12.5 mg total) by mouth daily. 08/08/20   [name], MD  magnesium hydroxide (MILK OF MAGNESIA) 400 MG/5ML suspension Take 30 mLs by mouth daily as needed for mild constipation.    [provider]  mirtazapine (REMERON) 15 MG tablet Take 0.5 tablets (7.5 mg total) by mouth at bedtime. 07/24/20   [name], MD  naphazoline-glycerin (CLEAR EYES REDNESS) 0.012-0.2 % SOLN Place 1-2 drops into both eyes 4 (four) times daily as needed for eye irritation. bausch and lomb    [provider]  omeprazole (PRILOSEC) 40 MG capsule TAKE 1 CAPSULE TWICE DAILY BEFORE MEALS Patient taking differently: Take 40 mg by mouth in the morning and at bedtime. TAKE 1 CAPSULE TWICE DAILY BEFORE MEALS 02/16/20   Minus Breeding, MD  pravastatin (PRAVACHOL) 40 MG tablet TAKE 1 TABLET EVERY EVENING Patient taking differently: Take 40 mg by mouth daily. 05/28/20   Minus Breeding, MD  Probiotic Product (PROBIOTIC DAILY PO) Take 1 capsule by mouth daily.    [provider]  Wheat Dextrin (BENEFIBER) POWD Take 2 Scoops by mouth 3 (three) times daily as needed.    [provider]    Physical Exam: Vitals:   08/17/20 1800 08/17/20 1900 08/17/20 2000 08/17/20 2220  BP: 130/66 (!) 142/61 (!) 143/59 (!) 158/75  Pulse: 75 74 73 64  Resp:  (!) 23 15 16   Temp:    98.6 F (37 C)  TempSrc:    Oral  SpO2: 100% 99% 100% 99%   Constitutional: Resting supine in bed, NAD, calm, comfortable Eyes: PERRL, lids and conjunctivae normal ENMT: Mucous membranes are dry. Posterior pharynx clear of any exudate or lesions.Normal dentition.  Neck: normal, supple, no masses. Respiratory: clear to auscultation bilaterally, no wheezing, no crackles. Normal respiratory effort. No accessory muscle use.  Cardiovascular: Regular rate and rhythm, 2/6 systolic murmur present. No extremity edema. 2+ pedal pulses. Abdomen: no tenderness, no masses palpated. No hepatosplenomegaly. Bowel sounds positive.  Musculoskeletal: no clubbing / cyanosis. No joint deformity upper and lower extremities. Good ROM, no contractures. Normal muscle tone.  Skin: no rashes, lesions, ulcers. No induration Neurologic: CN 2-12 grossly intact. Sensation intact. Strength 5/5 in all 4.  Psychiatric: Normal judgment and insight. Alert and oriented  x 3. Normal mood.   Labs on Admission: I have personally reviewed following labs and imaging studies  CBC: Recent Labs  Lab 08/17/20 1808  WBC 18.4*  NEUTROABS 14.5*  HGB 12.0  HCT 38.4  MCV 83.8  PLT 785   Basic Metabolic Panel: Recent Labs  Lab 08/17/20 1808  NA 133*  K 3.9  CL 91*  CO2 30  GLUCOSE 167*  BUN 24*  CREATININE 0.78  CALCIUM 10.2   GFR: Estimated Creatinine Clearance: 45.7 mL/min (by C-G formula based on SCr of 0.78 mg/dL). Liver Function Tests: Recent Labs  Lab 08/17/20 1808  AST 23  ALT 15  ALKPHOS 44  BILITOT 0.4  PROT 7.1  ALBUMIN 4.1   No results for input(s): LIPASE, AMYLASE in the last 168 hours. No results for input(s): AMMONIA in the last 168 hours. Coagulation Profile: No results for input(s): INR, PROTIME in the last 168 hours. Cardiac Enzymes: No results for input(s): CKTOTAL, CKMB, CKMBINDEX, TROPONINI in the last 168 hours. BNP (last 3 results) No results for input(s): PROBNP in the last 8760 hours. HbA1C: No results for input(s): HGBA1C in the last 72 hours. CBG: No results for input(s): GLUCAP in the last 168 hours. Lipid Profile: No results for input(s): CHOL, HDL, LDLCALC, TRIG, CHOLHDL, LDLDIRECT in the last 72 hours. Thyroid Function Tests: No results for input(s): TSH, T4TOTAL, FREET4, T3FREE, THYROIDAB in the last 72 hours. Anemia Panel: No results for input(s): VITAMINB12, FOLATE, FERRITIN, TIBC, IRON, RETICCTPCT in the last 72 hours. Urine analysis:    Component Value Date/Time   COLORURINE YELLOW 05/18/2018 [place] 05/18/2018 1123   LABSPEC 1.006 05/18/2018 1123   PHURINE 5.0 05/18/2018 1123   GLUCOSEU NEGATIVE 05/18/2018 1123   HGBUR NEGATIVE 05/18/2018 1123   BILIRUBINUR negative 09/12/2019 1001   KETONESUR NEGATIVE 05/18/2018 1123   PROTEINUR Negative 09/12/2019 1001   PROTEINUR NEGATIVE 05/18/2018 1123   UROBILINOGEN negative (A) 09/12/2019 1001   NITRITE negative 09/12/2019 1001    NITRITE NEGATIVE 05/18/2018 1123   LEUKOCYTESUR Small (1+) (A) 09/12/2019 1001    Radiological Exams on Admission: CT ABDOMEN PELVIS W CONTRAST  Result Date: 08/17/2020 CLINICAL DATA:  Heavy rectal bleeding. EXAM: CT ABDOMEN AND PELVIS WITH CONTRAST TECHNIQUE: Multidetector CT  imaging of the abdomen and pelvis was performed using the standard protocol following bolus administration of intravenous contrast. CONTRAST:  145mL OMNIPAQUE IOHEXOL 300 MG/ML  SOLN COMPARISON:  August 02, 2020 FINDINGS: Lower chest: No acute abnormality. Hepatobiliary: There is mild diffuse fatty infiltration of the liver parenchyma. No focal liver abnormality is seen. Ill-defined gallstones are seen within the lumen of a contracted gallbladder. Pancreas: Unremarkable. No pancreatic ductal dilatation or surrounding inflammatory changes. Spleen: Normal in size without focal abnormality. Adrenals/Urinary Tract: Adrenal glands are unremarkable. Kidneys are normal, without renal calculi, focal lesion, or hydronephrosis. The urinary bladder is moderately distended and otherwise normal in appearance. Stomach/Bowel: Stomach is within normal limits. Appendix appears normal. A large amount of stool is seen throughout the colon. This is most prominent within the distal sigmoid colon and rectum. No evidence of bowel wall thickening, distention, or inflammatory changes. Noninflamed diverticula are seen within the descending and sigmoid colon. Vascular/Lymphatic: Aortic atherosclerosis. No enlarged abdominal or pelvic lymph nodes. Reproductive: Status post hysterectomy. No adnexal masses. Other: No abdominal wall hernia or abnormality. No abdominopelvic ascites. Musculoskeletal: Extensive postoperative changes are seen at the levels of L3-L4 and L4-L5. Marked severity degenerative changes are noted at the level of L5-S1. IMPRESSION: 1. Large stool burden, most prominent within the distal sigmoid colon and rectum. 2. Colonic diverticulosis. 3.  Cholelithiasis. 4. Extensive postoperative changes within the lower lumbar spine. 5. Marked severity degenerative changes at the level of L5-S1. 6. Aortic atherosclerosis. Aortic Atherosclerosis (ICD10-I70.0). Electronically Signed   By: Virgina Norfolk M.D.   On: 08/17/2020 20:44    EKG: Personally reviewed. Sinus rhythm, RBBB and LAFB.  Not significantly changed when compared to prior.  Assessment/Plan Principal Problem:   Rectal bleeding Active Problems:   Mixed hyperlipidemia   HTN (hypertension)   Brain metastasis (HCC)   Primary cancer of right upper lobe of lung (HCC)   Hannah [name] is a 70 y.o. female with medical history significant for recently diagnosed stage IV right lung adenocarcinoma with brain metastases, HTN, HLD, RBBB, depression/anxiety, hemorrhoids and diverticulosis who is admitted with rectal bleeding.  Rectal bleeding: Patient with painful rectal bleeding.  CT scan with large stool burden most prominent in the distal sigmoid colon and rectum.  Colonic diverticulosis also noted. -GI consulted, plan for possible flexible sigmoidoscopy tomorrow -Keep n.p.o. after midnight -Hold home aspirin and other blood thinners -Placed on gentle IV fluid hydration overnight  Stage IV bronchogenic adenocarcinoma with brain metastases: Recently diagnosed and following with oncology, Dr. Mickeal Skinner and radiation oncology Dr. Tammi Klippel.  Not yet started on treatment.  Plans for SRS+ craniotomy on 08/14/2020.  Resume home Decadron when able.  Leukocytosis: Possibly reactive versus related to underlying cancer and steroid use.  No obvious infectious source.  Check urinalysis.  Hypertension: Currently stable.  Resume HCTZ when cleared for oral intake.  Hyperlipidemia: Resume home pravastatin when able.  Depression/anxiety: Resume as needed Xanax as able.  DVT prophylaxis: SCDs Code Status: Full code Family Communication: Discussed with patient, she has discussed with  family Disposition Plan: From home, dispo pending further GI evaluation of rectal bleeding Consults called: Gastroenterology Level of care: Med-Surg Admission status: Status is: Observation  The patient remains OBS appropriate and will d/c before 2 midnights.  Dispo: The patient is from: Home              Anticipated d/c is to: Home              Anticipated d/c date is:  1 day              Patient currently is not medically stable to d/c.   Difficult to place patient No   Zada Finders MD [facility]  If 7PM-7AM, please contact night-coverage [email or web address]  08/17/2020, 10:22 PM

## 2020-08-18 ENCOUNTER — Other Ambulatory Visit: Payer: Self-pay

## 2020-08-18 DIAGNOSIS — K921 Melena: Secondary | ICD-10-CM | POA: Diagnosis not present

## 2020-08-18 DIAGNOSIS — K625 Hemorrhage of anus and rectum: Secondary | ICD-10-CM | POA: Diagnosis not present

## 2020-08-18 DIAGNOSIS — E782 Mixed hyperlipidemia: Secondary | ICD-10-CM

## 2020-08-18 DIAGNOSIS — I10 Essential (primary) hypertension: Secondary | ICD-10-CM | POA: Diagnosis not present

## 2020-08-18 DIAGNOSIS — N816 Rectocele: Secondary | ICD-10-CM | POA: Diagnosis not present

## 2020-08-18 DIAGNOSIS — C7931 Secondary malignant neoplasm of brain: Secondary | ICD-10-CM

## 2020-08-18 DIAGNOSIS — M6289 Other specified disorders of muscle: Secondary | ICD-10-CM | POA: Diagnosis not present

## 2020-08-18 DIAGNOSIS — K5900 Constipation, unspecified: Secondary | ICD-10-CM

## 2020-08-18 DIAGNOSIS — C3411 Malignant neoplasm of upper lobe, right bronchus or lung: Secondary | ICD-10-CM

## 2020-08-18 DIAGNOSIS — K219 Gastro-esophageal reflux disease without esophagitis: Secondary | ICD-10-CM | POA: Diagnosis not present

## 2020-08-18 DIAGNOSIS — D62 Acute posthemorrhagic anemia: Secondary | ICD-10-CM | POA: Diagnosis present

## 2020-08-18 LAB — URINALYSIS, ROUTINE W REFLEX MICROSCOPIC
Bilirubin Urine: NEGATIVE
Glucose, UA: NEGATIVE mg/dL
Ketones, ur: NEGATIVE mg/dL
Nitrite: NEGATIVE
Protein, ur: NEGATIVE mg/dL
RBC / HPF: >50 RBC/hpf — ABNORMAL HIGH (ref 0–5)
Specific Gravity, Urine: 1.011 (ref 1.005–1.030)
pH: 8 (ref 5.0–8.0)

## 2020-08-18 LAB — BASIC METABOLIC PANEL WITH GFR
Anion gap: 9 (ref 5–15)
BUN: 17 mg/dL (ref 8–23)
CO2: 26 mmol/L (ref 22–32)
Calcium: 9.4 mg/dL (ref 8.9–10.3)
Chloride: 102 mmol/L (ref 98–111)
Creatinine, Ser: 0.66 mg/dL (ref 0.44–1.00)
GFR, Estimated: >60 mL/min
Glucose, Bld: 105 mg/dL — ABNORMAL HIGH (ref 70–99)
Potassium: 4.1 mmol/L (ref 3.5–5.1)
Sodium: 137 mmol/L (ref 135–145)

## 2020-08-18 LAB — CBC
HCT: 34.4 % — ABNORMAL LOW (ref 36.0–46.0)
Hemoglobin: 10.8 g/dL — ABNORMAL LOW (ref 12.0–15.0)
MCH: 26.3 pg (ref 26.0–34.0)
MCHC: 31.4 g/dL (ref 30.0–36.0)
MCV: 83.7 fL (ref 80.0–100.0)
Platelets: 225 10*3/uL (ref 150–400)
RBC: 4.11 MIL/uL (ref 3.87–5.11)
RDW: 13.2 % (ref 11.5–15.5)
WBC: 14.4 10*3/uL — ABNORMAL HIGH (ref 4.0–10.5)
nRBC: 0 % (ref 0.0–0.2)

## 2020-08-18 LAB — FOLATE: Folate: 16.8 ng/mL (ref >5.9)

## 2020-08-18 LAB — IRON AND TIBC
Iron: 26 ug/dL — ABNORMAL LOW (ref 28–170)
Saturation Ratios: 5 % — ABNORMAL LOW (ref 10.4–31.8)
TIBC: 523 ug/dL — ABNORMAL HIGH (ref 250–450)
UIBC: 497 ug/dL

## 2020-08-18 LAB — VITAMIN B12: Vitamin B-12: 331 pg/mL (ref 180–914)

## 2020-08-18 LAB — FERRITIN: Ferritin: 122 ng/mL (ref 11–307)

## 2020-08-18 LAB — HEMOGLOBIN AND HEMATOCRIT, BLOOD
HCT: 35.4 % — ABNORMAL LOW (ref 36.0–46.0)
Hemoglobin: 11.1 g/dL — ABNORMAL LOW (ref 12.0–15.0)

## 2020-08-18 MED ORDER — SODIUM CHLORIDE 0.9 % IV SOLN: INTRAVENOUS | Status: DC

## 2020-08-18 MED ORDER — ASCORBIC ACID 500 MG PO TABS
1000.0000 mg | ORAL_TABLET | Freq: Every day | ORAL | Status: DC
  Administered 2020-08-18 – 2020-08-19 (×2): 1000 mg via ORAL
  Filled 2020-08-18 (×2): qty 2

## 2020-08-18 MED ORDER — POLYETHYLENE GLYCOL 3350 17 G PO PACK
17.0000 g | PACK | Freq: Two times a day (BID) | ORAL | Status: DC
  Administered 2020-08-18 – 2020-08-19 (×3): 17 g via ORAL
  Filled 2020-08-18 (×3): qty 1

## 2020-08-18 MED ORDER — MIRTAZAPINE 15 MG PO TABS
7.5000 mg | ORAL_TABLET | Freq: Every day | ORAL | Status: DC
  Administered 2020-08-18: 7.5 mg via ORAL
  Filled 2020-08-18: qty 1

## 2020-08-18 MED ORDER — SORBITOL 70 % SOLN
960.0000 mL | TOPICAL_OIL | Freq: Once | ORAL | Status: AC
  Administered 2020-08-18: 960 mL via RECTAL
  Filled 2020-08-18: qty 473

## 2020-08-18 MED ORDER — SENNOSIDES-DOCUSATE SODIUM 8.6-50 MG PO TABS
1.0000 | ORAL_TABLET | Freq: Two times a day (BID) | ORAL | Status: DC
  Administered 2020-08-18 – 2020-08-19 (×3): 1 via ORAL
  Filled 2020-08-18 (×3): qty 1

## 2020-08-18 MED ORDER — WITCH HAZEL-GLYCERIN EX PADS
MEDICATED_PAD | CUTANEOUS | Status: DC | PRN
  Filled 2020-08-18: qty 100

## 2020-08-18 MED ORDER — DEXAMETHASONE 4 MG PO TABS
4.0000 mg | ORAL_TABLET | Freq: Every day | ORAL | Status: DC
  Administered 2020-08-18 – 2020-08-19 (×2): 4 mg via ORAL
  Filled 2020-08-18 (×2): qty 1

## 2020-08-18 MED ORDER — ALPRAZOLAM 0.25 MG PO TABS
0.2500 mg | ORAL_TABLET | Freq: Two times a day (BID) | ORAL | Status: DC | PRN
  Administered 2020-08-18: 0.25 mg via ORAL
  Filled 2020-08-18: qty 1

## 2020-08-18 MED ORDER — HYDRALAZINE HCL 20 MG/ML IJ SOLN: 5.0000 mg | Freq: Four times a day (QID) | INTRAMUSCULAR | Status: DC | PRN

## 2020-08-18 MED ORDER — PANTOPRAZOLE SODIUM 40 MG PO TBEC
40.0000 mg | DELAYED_RELEASE_TABLET | Freq: Every day | ORAL | Status: DC
  Administered 2020-08-18: 40 mg via ORAL
  Filled 2020-08-18: qty 1

## 2020-08-18 MED ORDER — PANTOPRAZOLE SODIUM 40 MG PO TBEC
40.0000 mg | DELAYED_RELEASE_TABLET | Freq: Two times a day (BID) | ORAL | Status: DC
  Administered 2020-08-18 – 2020-08-19 (×2): 40 mg via ORAL
  Filled 2020-08-18 (×2): qty 1

## 2020-08-18 MED ORDER — NAPHAZOLINE-GLYCERIN 0.012-0.2 % OP SOLN
1.0000 [drp] | Freq: Four times a day (QID) | OPHTHALMIC | Status: DC | PRN
  Filled 2020-08-18: qty 15

## 2020-08-18 MED ORDER — ACETAMINOPHEN 325 MG PO TABS
650.0000 mg | ORAL_TABLET | ORAL | Status: DC | PRN
  Administered 2020-08-18: 650 mg via ORAL
  Filled 2020-08-18: qty 2

## 2020-08-18 NOTE — Progress Notes (Signed)
Handbook has been given to patient.

## 2020-08-18 NOTE — Progress Notes (Signed)
PROGRESS NOTE    Hannah [name]  PNT:[id number] DOB: 1950-09-11 DOA: 08/17/2020 PCP: [name], MD    Chief Complaint  Patient presents with  . Rectal Bleeding    Brief Narrative:  Patient is pleasant 70 year old female history of recently diagnosed stage IV right lung adenocarcinoma with brain mets, hypertension, hyperlipidemia, RBBB, depression/anxiety, hemorrhoids, diverticulosis presenting with bright red blood per rectum.  Patient also endorsed some constipation with last bowel movement 4 days prior to admission despite enemas and milk of magnesia.  Patient noticed intermittent large amounts of bright red blood per rectum when attempting to have bowel movements with some associated rectal pain.  Patient denies any similar episodes in the past.  On low-dose aspirin.  Not on blood thinners. Patient admitted.  GI consultation pending.  Patient for probable flexible sigmoidoscopy.   Assessment & Plan:   Principal Problem:   Rectal bleeding Active Problems:   Mixed hyperlipidemia   HTN (hypertension)   Brain metastasis (HCC)   Primary cancer of right upper lobe of lung (HCC)  #1 rectal bleeding/GI bleed/acute blood loss anemia Questionable etiology.  Patient presented with painful rectal bleeding.?  Etiology.  Hemorrhoidal versus diverticular, versus secondary to constipation.  CT abdomen and pelvis done with large stool burden most prominent in the distal sigmoid colon and rectum.  Colonic diverticulosis noted.  Patient with ongoing rectal bleeding this morning.  Hemoglobin currently at 10.8 from 12.0 on admission.  Check an anemia panel.  Repeat H&H at 2 PM.  Continue gentle hydration.  Transfusion threshold hemoglobin < 7.  GI consulted and consultation currently pending.  Patient for probable flexible sigmoidoscopy for further evaluation and management.  Continue to hold aspirin.  Per GI.  2.  Recently diagnosed stage IV bronchogenic adenocarcinoma with brain mets Recently  diagnosed and being followed by neuro oncology, Dr. Mickeal Skinner and radiation oncology Dr. Tammi Klippel.  Has not started treatment yet.  Per daughter plans for [facility] plus craniotomy next week.  Decadron currently on hold could likely resume in the next 1 to 2 days or when okay with GI.  3.  Leukocytosis Likely reactive versus secondary to underlying cancer and chronic steroid use.  Patient afebrile.  No signs of infection.  Patient with no pulmonary symptoms concerning for infiltrate.  No cough.  Patient denies any urinary symptoms.  Urinalysis with small leukocytes, nitrite negative, 6-10 WBCs.  Leukocytosis trending down.  Follow.  4.  Hypertension Stable.  Continue to hold HCTZ due to problem #1.  Blood pressure currently stable.  If further blood pressure control needed will start Norvasc.  Follow.  5.  Hyperlipidemia Resume statin on discharge.  6.  Depression/anxiety Continue Xanax as needed.  7.  Constipation Placed on Senokot-S twice daily.  Patient for flex sig possibly today.  Will defer further laxatives versus use of enema to GI.  Follow.   DVT prophylaxis: SCDs Code Status: Full Family Communication: Updated patient and daughter at bedside. Disposition:   Status is: Observation    Dispo: The patient is from: Home              Anticipated d/c is to: Home              Anticipated d/c date is: 2 to 3 days.              Patient currently being worked up for bright red blood per rectum.  Not stable for discharge.   Difficult to place patient no  Consultants:   GI pending  Procedures:   CT abdomen and pelvis 08/17/2020    Antimicrobials:   None   Subjective: Patient lying in bed.  Stated had bloody bowel movement this morning which was bright red.  Bright red blood noted in hat and bathroom.  No shortness of breath.  No chest pain.  Daughter at bedside.  Objective: Vitals:   08/17/20 2220 08/17/20 2230 08/18/20 0213 08/18/20 0608  BP: (!) 158/75  (!) 149/73  132/76  Pulse: 64  69 73  Resp: 16  20 16   Temp: 98.6 F (37 C)  97.9 F (36.6 C) 98.1 F (36.7 C)  TempSrc: Oral  Oral   SpO2: 99%  100% 99%  Weight:  49.3 kg    Height:  5' (1.524 m)      Intake/Output Summary (Last 24 hours) at 08/18/2020 0902 Last data filed at 08/18/2020 0740 Gross per 24 hour  Intake 721.02 ml  Output 600 ml  Net 121.02 ml   Filed Weights   08/17/20 2230  Weight: 49.3 kg    Examination:  General exam: Appears calm and comfortable  Respiratory system: Clear to auscultation. Respiratory effort normal. Cardiovascular system: S1 & S2 heard, RRR. No JVD, murmurs, rubs, gallops or clicks. No pedal edema. Gastrointestinal system: Abdomen is nondistended, soft and tender to deep palpation in the left lower quadrant.  Positive bowel sounds.  No rebound.  No guarding.   Central nervous system: Alert and oriented. No focal neurological deficits. Extremities: Symmetric 5 x 5 power. Skin: No rashes, lesions or ulcers Psychiatry: Judgement and insight appear normal. Mood & affect appropriate.     Data Reviewed: I have personally reviewed following labs and imaging studies  CBC: Recent Labs  Lab 08/17/20 1808 08/18/20 0358  WBC 18.4* 14.4*  NEUTROABS 14.5*  --   HGB 12.0 10.8*  HCT 38.4 34.4*  MCV 83.8 83.7  PLT 236 161    Basic Metabolic Panel: Recent Labs  Lab 08/17/20 1808 08/18/20 0358  NA 133* 137  K 3.9 4.1  CL 91* 102  CO2 30 26  GLUCOSE 167* 105*  BUN 24* 17  CREATININE 0.78 0.66  CALCIUM 10.2 9.4    GFR: Estimated Creatinine Clearance: 45.7 mL/min (by C-G formula based on SCr of 0.66 mg/dL).  Liver Function Tests: Recent Labs  Lab 08/17/20 1808  AST 23  ALT 15  ALKPHOS 44  BILITOT 0.4  PROT 7.1  ALBUMIN 4.1    CBG: No results for input(s): GLUCAP in the last 168 hours.   Recent Results (from the past 240 hour(s))  SARS CORONAVIRUS 2 (TAT 6-24 HRS) Nasopharyngeal Nasopharyngeal Swab     Status: None   Collection  Time: 08/08/20 12:55 PM   Specimen: Nasopharyngeal Swab  Result Value Ref Range Status   SARS Coronavirus 2 NEGATIVE NEGATIVE Final    Comment: (NOTE) SARS-CoV-2 target nucleic acids are NOT DETECTED.  The SARS-CoV-2 RNA is generally detectable in upper and lower respiratory specimens during the acute phase of infection. Negative results do not preclude SARS-CoV-2 infection, do not rule out co-infections with other pathogens, and should not be used as the sole basis for treatment or other patient management decisions. Negative results must be combined with clinical observations, patient history, and epidemiological information. The expected result is Negative.  Fact Sheet for Patients: SugarRoll.be  Fact Sheet for Healthcare Providers: https://www.woods-mathews.com/  This test is not yet approved or cleared by the [place] FDA and  has been  authorized for detection and/or diagnosis of SARS-CoV-2 by FDA under an Emergency Use Authorization (EUA). This EUA will remain  in effect (meaning this test can be used) for the duration of the COVID-19 declaration under Se ction 564(b)(1) of the Act, 21 U.S.C. section 360bbb-3(b)(1), unless the authorization is terminated or revoked sooner.  Performed at [facility], [place] [address] [place], [place] [address]   Resp Panel by RT-PCR (Flu A&B, Covid) Nasopharyngeal Swab     Status: None   Collection Time: 08/17/20  7:30 PM   Specimen: Nasopharyngeal Swab; Nasopharyngeal(NP) swabs in vial transport medium  Result Value Ref Range Status   SARS Coronavirus 2 by RT PCR NEGATIVE NEGATIVE Final    Comment: (NOTE) SARS-CoV-2 target nucleic acids are NOT DETECTED.  The SARS-CoV-2 RNA is generally detectable in upper respiratory specimens during the acute phase of infection. The lowest concentration of SARS-CoV-2 viral copies this assay can detect is 138 copies/mL. A negative result does not  preclude SARS-Cov-2 infection and should not be used as the sole basis for treatment or other patient management decisions. A negative result may occur with  improper specimen collection/handling, submission of specimen other than nasopharyngeal swab, presence of viral mutation(s) within the areas targeted by this assay, and inadequate number of viral copies(<138 copies/mL). A negative result must be combined with clinical observations, patient history, and epidemiological information. The expected result is Negative.  Fact Sheet for Patients:  EntrepreneurPulse.com.au  Fact Sheet for Healthcare Providers:  IncredibleEmployment.be  This test is no t yet approved or cleared by the [place] FDA and  has been authorized for detection and/or diagnosis of SARS-CoV-2 by FDA under an Emergency Use Authorization (EUA). This EUA will remain  in effect (meaning this test can be used) for the duration of the COVID-19 declaration under Section 564(b)(1) of the Act, 21 U.S.C.section 360bbb-3(b)(1), unless the authorization is terminated  or revoked sooner.       Influenza A by PCR NEGATIVE NEGATIVE Final   Influenza B by PCR NEGATIVE NEGATIVE Final    Comment: (NOTE) The Xpert Xpress SARS-CoV-2/FLU/RSV plus assay is intended as an aid in the diagnosis of influenza from Nasopharyngeal swab specimens and should not be used as a sole basis for treatment. Nasal washings and aspirates are unacceptable for Xpert Xpress SARS-CoV-2/FLU/RSV testing.  Fact Sheet for Patients: EntrepreneurPulse.com.au  Fact Sheet for Healthcare Providers: IncredibleEmployment.be  This test is not yet approved or cleared by the [place] FDA and has been authorized for detection and/or diagnosis of SARS-CoV-2 by FDA under an Emergency Use Authorization (EUA). This EUA will remain in effect (meaning this test can be used) for the  duration of the COVID-19 declaration under Section 564(b)(1) of the Act, 21 U.S.C. section 360bbb-3(b)(1), unless the authorization is terminated or revoked.  Performed at [facility], [place] [address], [place], [place] [address]          Radiology Studies: CT ABDOMEN PELVIS W CONTRAST  Result Date: 08/17/2020 CLINICAL DATA:  Heavy rectal bleeding. EXAM: CT ABDOMEN AND PELVIS WITH CONTRAST TECHNIQUE: Multidetector CT imaging of the abdomen and pelvis was performed using the standard protocol following bolus administration of intravenous contrast. CONTRAST:  125mL OMNIPAQUE IOHEXOL 300 MG/ML  SOLN COMPARISON:  August 02, 2020 FINDINGS: Lower chest: No acute abnormality. Hepatobiliary: There is mild diffuse fatty infiltration of the liver parenchyma. No focal liver abnormality is seen. Ill-defined gallstones are seen within the lumen of a contracted gallbladder. Pancreas: Unremarkable. No  pancreatic ductal dilatation or surrounding inflammatory changes. Spleen: Normal in size without focal abnormality. Adrenals/Urinary Tract: Adrenal glands are unremarkable. Kidneys are normal, without renal calculi, focal lesion, or hydronephrosis. The urinary bladder is moderately distended and otherwise normal in appearance. Stomach/Bowel: Stomach is within normal limits. Appendix appears normal. A large amount of stool is seen throughout the colon. This is most prominent within the distal sigmoid colon and rectum. No evidence of bowel wall thickening, distention, or inflammatory changes. Noninflamed diverticula are seen within the descending and sigmoid colon. Vascular/Lymphatic: Aortic atherosclerosis. No enlarged abdominal or pelvic lymph nodes. Reproductive: Status post hysterectomy. No adnexal masses. Other: No abdominal wall hernia or abnormality. No abdominopelvic ascites. Musculoskeletal: Extensive postoperative changes are seen at the levels of L3-L4 and L4-L5. Marked severity  degenerative changes are noted at the level of L5-S1. IMPRESSION: 1. Large stool burden, most prominent within the distal sigmoid colon and rectum. 2. Colonic diverticulosis. 3. Cholelithiasis. 4. Extensive postoperative changes within the lower lumbar spine. 5. Marked severity degenerative changes at the level of L5-S1. 6. Aortic atherosclerosis. Aortic Atherosclerosis (ICD10-I70.0). Electronically Signed   By: Virgina Norfolk M.D.   On: 08/17/2020 20:44        Scheduled Meds: . pantoprazole  40 mg Oral Q0600  . senna-docusate  1 tablet Oral BID   Continuous Infusions: . sodium chloride       LOS: 0 days    Time spent: 40 minutes    Irine Seal, MD [facility]   To contact the attending provider between 7A-7P or the covering provider during after hours 7P-7A, please log into the web site [email or web address] and access using universal [place] password for that web site. If you do not have the password, please call the hospital operator.  08/18/2020, 9:02 AM

## 2020-08-18 NOTE — Plan of Care (Signed)
  Problem: Clinical Measurements: Goal: Respiratory complications will improve Outcome: Progressing   Problem: Clinical Measurements: Goal: Cardiovascular complication will be avoided Outcome: Progressing   Problem: Coping: Goal: Level of anxiety will decrease Outcome: Progressing   Problem: Pain Managment: Goal: General experience of comfort will improve Outcome: Progressing   Problem: Safety: Goal: Ability to remain free from injury will improve Outcome: Progressing   Problem: Skin Integrity: Goal: Risk for impaired skin integrity will decrease Outcome: Progressing

## 2020-08-18 NOTE — Consult Note (Signed)
Referring Provider:  [facility]         Primary Care Physician:  [name], MD Primary Gastroenterologist: Zenovia Jarred, MD            We were asked to see this patient for:  Rectal bleeding                ASSESSMENT / PLAN:   # 70 yo female with acute on chronic constipation,  rectal pain and bleeding. She is impacted on exam. Patient has known  rectocele, enterocele and rectal prolapse on MRI. She had recent surgical evaluation but decided against it for now. On DRE she is impacted with stool. The rectal pain, bleeding may be secondary to stercoral ulcer. WBC elevated, maybe from steroids and or stercoral ulcer --Will give SMOG enema now. She may need flexible sigmoidoscopy in am. --Clear liquid for now   # Recently diagnosed lung cancer with brain mets, hasn't started treatment yet.      HPI:                                                                                                                             Chief Complaint:  Rectal pain and bleeding, constipation.   Hannah [name] is a 70 y.o. female with history of colon polyps, rectal prolapse, lung cancer with mets ( no treatment started next), HTN, hyperlipidemia, chronic constipation .   Patient has been followed by [place] for chronic constipation / rectal prolapse. She was found to have a rectocele, enterocele and rectal mucosal prolapse noted on MRI. We referred her to Urogynecologist at [facility] and she was seen by Dr. [place] Pink on 07/05/20.  Treatment option were discussed including robotic-assisted ventral mesh rectopexy with concomitant sacral colpopexy. Dr. Zigmund Daniel, patient and her daughter were hesitant to proceed with such an extensive surgery so hich is a relatively large operation. Therefore she was advised to takes Senna, use squatty potty and do post evacuation enemas.  Patient present to ED yesterday with constipation, rectal pain and rectal bleeding. She has also been Senna and MOM as needed.  Tried Miralax in past but it caused stomach discomfort and Linzess caused urgency. Senna causes purging of bowels leading to hemorrhoid flare up and she hasn't taken it in several days and hasn't had a BM in over a week now despite a dose of MOM yesterday.  She is having rectal bleeding despite no BM in a week. Her WBC is elevated at 14.4. CSR shows stable right lung mass Hgb is 10.8. No abdominal pain    PREVIOUS ENDOSCOPIC EVALUATIONS / PERTINENT STUDIES    Nov 2020 polyp surveillance  --Hemorrhoids found on perianal exam. - Two 3 to 4 mm polyps in the transverse colon, removed with a cold snare. Resected and retrieved. - One 5 mm polyp in the sigmoid colon, removed with a cold snare. Resected and retrieved. - One 4 mm polyp in the proximal rectum, removed  with a cold snare. Resected and retrieved. - Congested mucosa in the distal rectum. Biopsied. - Diverticulosis in the sigmoid colon and in the descending colon. - Internal hemorrhoids.   Past Medical History:  Diagnosis Date  . Allergy   . Anemia   . Anxiety   . Arthritis   . Carpal tunnel syndrome   . Constipation    senna C stool softeners help   . Depression   . Diverticulosis   . Dyslipidemia   . External hemorrhoids   . GERD (gastroesophageal reflux disease)   . Heart murmur    mild-moderate AR  . Hiatal hernia   . Hyperlipidemia    on meds   . Hypertension   . Internal hemorrhoids   . Osteoarthritis   . Pre-diabetes   . PVD (peripheral vascular disease) (HCC)    moderate carotid disease  . RBBB   . Smoker   . Vocal cord polyps     Past Surgical History:  Procedure Laterality Date  . ABDOMINAL HYSTERECTOMY  1994  . COLONOSCOPY    . COLONOSCOPY W/ POLYPECTOMY  2009  . HEMORRHOID SURGERY    . JOINT REPLACEMENT    . POLYPECTOMY    . right total knee arthroplasty     Dr. Emeterio Reeve 06-04-18  . SPINE SURGERY  08/16/2009  . TOTAL KNEE ARTHROPLASTY Left 02/13/2014   Procedure: TOTAL KNEE ARTHROPLASTY;  Surgeon:  Alta Corning, MD;  Location: [place];  Service: Orthopedics;  Laterality: Left;  . TOTAL KNEE ARTHROPLASTY Right 06/04/2018   Procedure: RIGHT TOTAL KNEE ARTHROPLASTY;  Surgeon: Dorna Leitz, MD;  Location: [facility];  Service: Orthopedics;  Laterality: Right;  Adductor Block  . TUBAL LIGATION      Prior to Admission medications   Medication Sig Start Date End Date Taking? Authorizing Provider  ALPRAZolam Duanne Moron) 0.5 MG tablet Take 1 tablet (0.5 mg total) by mouth 2 (two) times daily as needed. for sleep Patient taking differently: Take 0.25 mg by mouth 2 (two) times daily as needed. for sleep 03/14/20  Yes Burns, Claudina Lick, MD  Ascorbic Acid (VITAMIN C PO) Take 1,000 mg by mouth daily.   Yes [provider]  aspirin 81 MG tablet Take 1 tablet (81 mg total) by mouth daily. 09/10/18  Yes Burns, Claudina Lick, MD  calcium citrate-vitamin D (CITRACAL+D) 315-200 MG-UNIT tablet Take 1 tablet by mouth daily.   Yes [provider]  carboxymethylcellulose (REFRESH PLUS) 0.5 % SOLN Place 1-2 drops into both eyes daily.   Yes [provider]  dexamethasone (DECADRON) 2 MG tablet Take 1 tablet (2 mg total) by mouth daily. Take with food. Patient taking differently: Take 4 mg by mouth daily. Take with food. 08/16/20  Yes Vaslow, Acey Lav, MD  hydrochlorothiazide (HYDRODIURIL) 12.5 MG tablet Take 1 tablet (12.5 mg total) by mouth daily. 08/08/20  Yes Burns, Claudina Lick, MD  magnesium hydroxide (MILK OF MAGNESIA) 400 MG/5ML suspension Take 30 mLs by mouth daily as needed for mild constipation.   Yes [provider]  mirtazapine (REMERON) 15 MG tablet Take 0.5 tablets (7.5 mg total) by mouth at bedtime. 07/24/20  Yes Burns, Claudina Lick, MD  naphazoline-glycerin (CLEAR EYES REDNESS) 0.012-0.2 % SOLN Place 1-2 drops into both eyes 4 (four) times daily as needed for eye irritation. bausch and lomb   Yes [provider]  omeprazole (PRILOSEC) 40 MG capsule TAKE 1 CAPSULE TWICE DAILY BEFORE  MEALS Patient taking differently: Take 40 mg by mouth in the  morning and at bedtime. TAKE 1 CAPSULE TWICE DAILY BEFORE MEALS 02/16/20  Yes Hochrein, Jeneen Rinks, MD  pravastatin (PRAVACHOL) 40 MG tablet TAKE 1 TABLET EVERY EVENING Patient taking differently: Take 40 mg by mouth daily. 05/28/20  Yes Minus Breeding, MD  Probiotic Product (PROBIOTIC DAILY PO) Take 1 capsule by mouth daily.   Yes [provider]  Wheat Dextrin (BENEFIBER) POWD Take 2 Scoops by mouth in the morning and at bedtime.   Yes [provider]  fenofibrate 160 MG tablet Take 1 tablet (160 mg total) by mouth daily. Patient not taking: No sig reported 03/14/20   [name], MD    Current Facility-Administered Medications  Medication Dose Route Frequency Provider Last Rate Last Admin  . 0.9 %  sodium chloride infusion   Intravenous Continuous Eugenie Filler, MD      . hydrALAZINE (APRESOLINE) injection 5 mg  5 mg Intravenous Q6H PRN Eugenie Filler, MD      . ondansetron [facility]) tablet 4 mg  4 mg Oral Q6H PRN Lenore Cordia, MD       Or  . ondansetron (ZOFRAN) injection 4 mg  4 mg Intravenous Q6H PRN Lenore Cordia, MD      . pantoprazole (PROTONIX) EC tablet 40 mg  40 mg Oral Q0600 Eugenie Filler, MD      . senna-docusate (Senokot-S) tablet 1 tablet  1 tablet Oral BID Eugenie Filler, MD        Allergies as of 08/17/2020 - Review Complete 08/17/2020  Allergen Reaction Noted  . Amlodipine  10/01/2011  . Chantix [varenicline tartrate] Other (See Comments) 10/01/2011  . Clarithromycin Rash 10/01/2011  . Lisinopril Hives 10/01/2011  . Simvastatin Hives 10/01/2011  . Wellbutrin [bupropion hcl] Hives 10/01/2011  . Lipitor [atorvastatin]  11/29/2013  . Sertraline  07/24/2020    Family History  Problem Relation Age of Onset  . Cancer Father   . Prostate cancer Father   . Diabetes Sister   . Cancer Sister   . Stroke Maternal Grandfather   . Colitis Maternal Aunt   . Breast cancer  Maternal Aunt   . Colon cancer Neg Hx   . Colon polyps Neg Hx   . Rectal cancer Neg Hx   . Stomach cancer Neg Hx   . Esophageal cancer Neg Hx     Social History   Socioeconomic History  . Marital status: Married    Spouse name: Not on file  . Number of children: 2  . Years of education: Not on file  . Highest education level: Not on file  Occupational History  . Not on file  Tobacco Use  . Smoking status: Former Smoker    Packs/day: 0.25    Years: 40.00    Pack years: 10.00    Types: Cigarettes    Quit date: 07/09/2020    Years since quitting: 0.1  . Smokeless tobacco: Never Used  . Tobacco comment: PACK WILL LAST 3 days  Vaping Use  . Vaping Use: Never used  Substance and Sexual Activity  . Alcohol use: No  . Drug use: No  . Sexual activity: Not Currently  Other Topics Concern  . Not on file  Social History Narrative  . Not on file   Social Determinants of Health   Financial Resource Strain: Low Risk   . Difficulty of Paying Living Expenses: Not hard at all  Food Insecurity: No Food Insecurity  . Worried About Charity fundraiser in  the Last Year: Never true  . Ran Out of Food in the Last Year: Never true  Transportation Needs: No Transportation Needs  . Lack of Transportation (Medical): No  . Lack of Transportation (Non-Medical): No  Physical Activity: Sufficiently Active  . Days of Exercise per Week: 4 days  . Minutes of Exercise per Session: 50 min  Stress: No Stress Concern Present  . Feeling of Stress : Not at all  Social Connections: Socially Integrated  . Frequency of Communication with Friends and Family: More than three times a week  . Frequency of Social Gatherings with Friends and Family: More than three times a week  . Attends Religious Services: More than 4 times per year  . Active Member of Clubs or Organizations: Yes  . Attends Archivist Meetings: More than 4 times per year  . Marital Status: Married  Human resources officer Violence:  Not on file    Review of Systems: All systems reviewed and negative except where noted in HPI.    OBJECTIVE:    Physical Exam: Vital signs in last 24 hours: Temp:  [97.4 F (36.3 C)-98.6 F (37 C)] 98.1 F (36.7 C) (02/19 0608) Pulse Rate:  [64-94] 73 (02/19 0608) Resp:  [15-23] 16 (02/19 0608) BP: (130-158)/(59-79) 132/76 (02/19 0608) SpO2:  [99 %-100 %] 99 % (02/19 9163) Weight:  [49.3 kg] 49.3 kg (02/18 2230) Last BM Date: 08/13/20 General:   Alert thin female in NAD Psych:  Pleasant, cooperative. Normal mood and affect. Eyes:  Pupils equal, sclera clear, no icterus.   Conjunctiva pink. Ears:  Normal auditory acuity. Nose:  No deformity, discharge,  or lesions. Neck:  Supple; no masses Lungs:  Clear throughout to auscultation.   No wheezes, crackles, or rhonchi.  Heart:  Regular rate and rhythm; no murmurs, no lower extremity edema Abdomen:  Soft, non-distended, nontender, BS active, no palp mass   Rectal:  Small amount of red blood on sheets and around anus. Mild bulging of left perianal area. On DRE she is impacted with soft soft. No obvious fissures Msk:  Symmetrical without gross deformities. . Neurologic:  Alert and  oriented x4;  grossly normal neurologically. Skin:  Intact without significant lesions or rashes.  Filed Weights   08/17/20 2230  Weight: 49.3 kg     Scheduled inpatient medications . pantoprazole  40 mg Oral Q0600  . senna-docusate  1 tablet Oral BID      Intake/Output from previous day: 02/18 0701 - 02/19 0700 In: 721 [I.V.:721] Out: -  Intake/Output this shift: Total I/O In: 0  Out: 600 [Urine:600]   Lab Results: Recent Labs    08/17/20 1808 08/18/20 0358  WBC 18.4* 14.4*  HGB 12.0 10.8*  HCT 38.4 34.4*  PLT 236 225   BMET Recent Labs    08/17/20 1808 08/18/20 0358  NA 133* 137  K 3.9 4.1  CL 91* 102  CO2 30 26  GLUCOSE 167* 105*  BUN 24* 17  CREATININE 0.78 0.66  CALCIUM 10.2 9.4   LFT Recent Labs     08/17/20 1808  PROT 7.1  ALBUMIN 4.1  AST 23  ALT 15  ALKPHOS 44  BILITOT 0.4   PT/INR No results for input(s): LABPROT, INR in the last 72 hours. Hepatitis Panel No results for input(s): HEPBSAG, HCVAB, HEPAIGM, HEPBIGM in the last 72 hours.   . CBC Latest Ref Rng & Units 08/18/2020 08/17/2020 08/10/2020  WBC 4.0 - 10.5 K/uL 14.4(H) 18.4(H) 9.9  Hemoglobin 12.0 -  15.0 g/dL 10.8(L) 12.0 12.4  Hematocrit 36.0 - 46.0 % 34.4(L) 38.4 39.7  Platelets 150 - 400 K/uL 225 236 282    . CMP Latest Ref Rng & Units 08/18/2020 08/17/2020 08/07/2020  Glucose 70 - 99 mg/dL 105(H) 167(H) 109(H)  BUN 8 - 23 mg/dL 17 24(H) 26(H)  Creatinine 0.44 - 1.00 mg/dL 0.66 0.78 0.92  Sodium 135 - 145 mmol/L 137 133(L) 137  Potassium 3.5 - 5.1 mmol/L 4.1 3.9 4.5  Chloride 98 - 111 mmol/L 102 91(L) 101  CO2 22 - 32 mmol/L 26 30 29   Calcium 8.9 - 10.3 mg/dL 9.4 10.2 10.4(H)  Total Protein 6.5 - 8.1 g/dL - 7.1 7.7  Total Bilirubin 0.3 - 1.2 mg/dL - 0.4 0.3  Alkaline Phos 38 - 126 U/L - 44 42  AST 15 - 41 U/L - 23 17  ALT 0 - 44 U/L - 15 11   Studies/Results: CT ABDOMEN PELVIS W CONTRAST  Result Date: 08/17/2020 CLINICAL DATA:  Heavy rectal bleeding. EXAM: CT ABDOMEN AND PELVIS WITH CONTRAST TECHNIQUE: Multidetector CT imaging of the abdomen and pelvis was performed using the standard protocol following bolus administration of intravenous contrast. CONTRAST:  137mL OMNIPAQUE IOHEXOL 300 MG/ML  SOLN COMPARISON:  August 02, 2020 FINDINGS: Lower chest: No acute abnormality. Hepatobiliary: There is mild diffuse fatty infiltration of the liver parenchyma. No focal liver abnormality is seen. Ill-defined gallstones are seen within the lumen of a contracted gallbladder. Pancreas: Unremarkable. No pancreatic ductal dilatation or surrounding inflammatory changes. Spleen: Normal in size without focal abnormality. Adrenals/Urinary Tract: Adrenal glands are unremarkable. Kidneys are normal, without renal calculi, focal  lesion, or hydronephrosis. The urinary bladder is moderately distended and otherwise normal in appearance. Stomach/Bowel: Stomach is within normal limits. Appendix appears normal. A large amount of stool is seen throughout the colon. This is most prominent within the distal sigmoid colon and rectum. No evidence of bowel wall thickening, distention, or inflammatory changes. Noninflamed diverticula are seen within the descending and sigmoid colon. Vascular/Lymphatic: Aortic atherosclerosis. No enlarged abdominal or pelvic lymph nodes. Reproductive: Status post hysterectomy. No adnexal masses. Other: No abdominal wall hernia or abnormality. No abdominopelvic ascites. Musculoskeletal: Extensive postoperative changes are seen at the levels of L3-L4 and L4-L5. Marked severity degenerative changes are noted at the level of L5-S1. IMPRESSION: 1. Large stool burden, most prominent within the distal sigmoid colon and rectum. 2. Colonic diverticulosis. 3. Cholelithiasis. 4. Extensive postoperative changes within the lower lumbar spine. 5. Marked severity degenerative changes at the level of L5-S1. 6. Aortic atherosclerosis. Aortic Atherosclerosis (ICD10-I70.0). Electronically Signed   By: Virgina Norfolk M.D.   On: 08/17/2020 20:44    Principal Problem:   Rectal bleeding Active Problems:   Mixed hyperlipidemia   HTN (hypertension)   Brain metastasis ([place])   Primary cancer of right upper lobe of lung (HCC)    Tye Savoy, NP-C @  08/18/2020, 8:47 AM

## 2020-08-18 NOTE — Progress Notes (Signed)
Great results with smog enema given. Very little bright right blood present with bowel movement after enema given. Pt stated she felt relief after enema. Md notified of these results and need for home medications to be addressed.

## 2020-08-18 NOTE — Plan of Care (Signed)
  Problem: Education: Goal: Knowledge of General Education information will improve Description Including pain rating scale, medication(s)/side effects and non-pharmacologic comfort measures Outcome: Progressing   

## 2020-08-19 DIAGNOSIS — C3411 Malignant neoplasm of upper lobe, right bronchus or lung: Secondary | ICD-10-CM | POA: Diagnosis not present

## 2020-08-19 DIAGNOSIS — K625 Hemorrhage of anus and rectum: Secondary | ICD-10-CM

## 2020-08-19 DIAGNOSIS — I739 Peripheral vascular disease, unspecified: Secondary | ICD-10-CM

## 2020-08-19 DIAGNOSIS — K921 Melena: Secondary | ICD-10-CM | POA: Diagnosis not present

## 2020-08-19 DIAGNOSIS — K219 Gastro-esophageal reflux disease without esophagitis: Secondary | ICD-10-CM | POA: Diagnosis not present

## 2020-08-19 DIAGNOSIS — K5900 Constipation, unspecified: Secondary | ICD-10-CM | POA: Diagnosis not present

## 2020-08-19 DIAGNOSIS — E782 Mixed hyperlipidemia: Secondary | ICD-10-CM | POA: Diagnosis not present

## 2020-08-19 DIAGNOSIS — D62 Acute posthemorrhagic anemia: Secondary | ICD-10-CM | POA: Diagnosis not present

## 2020-08-19 DIAGNOSIS — I10 Essential (primary) hypertension: Secondary | ICD-10-CM | POA: Diagnosis not present

## 2020-08-19 DIAGNOSIS — C7931 Secondary malignant neoplasm of brain: Secondary | ICD-10-CM | POA: Diagnosis not present

## 2020-08-19 LAB — CBC WITH DIFFERENTIAL/PLATELET
Abs Immature Granulocytes: 0.04 10*3/uL (ref 0.00–0.07)
Basophils Absolute: 0 10*3/uL (ref 0.0–0.1)
Basophils Relative: 0 %
Eosinophils Absolute: 0 10*3/uL (ref 0.0–0.5)
Eosinophils Relative: 0 %
HCT: 32.6 % — ABNORMAL LOW (ref 36.0–46.0)
Hemoglobin: 10.2 g/dL — ABNORMAL LOW (ref 12.0–15.0)
Immature Granulocytes: 1 %
Lymphocytes Relative: 15 %
Lymphs Abs: 1.3 10*3/uL (ref 0.7–4.0)
MCH: 26.8 pg (ref 26.0–34.0)
MCHC: 31.3 g/dL (ref 30.0–36.0)
MCV: 85.6 fL (ref 80.0–100.0)
Monocytes Absolute: 0.5 10*3/uL (ref 0.1–1.0)
Monocytes Relative: 5 %
Neutro Abs: 7 10*3/uL (ref 1.7–7.7)
Neutrophils Relative %: 79 %
Platelets: 198 10*3/uL (ref 150–400)
RBC: 3.81 MIL/uL — ABNORMAL LOW (ref 3.87–5.11)
RDW: 13.7 % (ref 11.5–15.5)
WBC: 8.8 10*3/uL (ref 4.0–10.5)
nRBC: 0 % (ref 0.0–0.2)

## 2020-08-19 LAB — BASIC METABOLIC PANEL
Anion gap: 6 (ref 5–15)
BUN: 9 mg/dL (ref 8–23)
CO2: 27 mmol/L (ref 22–32)
Calcium: 9.3 mg/dL (ref 8.9–10.3)
Chloride: 106 mmol/L (ref 98–111)
Creatinine, Ser: 0.71 mg/dL (ref 0.44–1.00)
GFR, Estimated: >60 mL/min (ref >60)
Glucose, Bld: 118 mg/dL — ABNORMAL HIGH (ref 70–99)
Potassium: 4.8 mmol/L (ref 3.5–5.1)
Sodium: 139 mmol/L (ref 135–145)

## 2020-08-19 LAB — MAGNESIUM: Magnesium: 2.4 mg/dL (ref 1.7–2.4)

## 2020-08-19 MED ORDER — SODIUM CHLORIDE 0.9 % IV SOLN: INTRAVENOUS | Status: DC

## 2020-08-19 MED ORDER — POLYETHYLENE GLYCOL 3350 17 G PO PACK: 17.0000 g | PACK | Freq: Two times a day (BID) | ORAL | 0 refills | Status: AC

## 2020-08-19 MED ORDER — ASPIRIN 81 MG PO CHEW
81.0000 mg | CHEWABLE_TABLET | Freq: Every day | ORAL | Status: DC
  Administered 2020-08-19: 81 mg via ORAL
  Filled 2020-08-19: qty 1

## 2020-08-19 MED ORDER — HYDROCHLOROTHIAZIDE 12.5 MG PO TABS: 12.5000 mg | ORAL_TABLET | Freq: Every day | ORAL | 1 refills | Status: DC

## 2020-08-19 MED ORDER — SENNOSIDES-DOCUSATE SODIUM 8.6-50 MG PO TABS: 1.0000 | ORAL_TABLET | Freq: Two times a day (BID) | ORAL | Status: DC

## 2020-08-19 NOTE — Plan of Care (Signed)
  Problem: Clinical Measurements: Goal: Diagnostic test results will improve Outcome: Progressing   Problem: Clinical Measurements: Goal: Respiratory complications will improve Outcome: Progressing   Problem: Clinical Measurements: Goal: Cardiovascular complication will be avoided Outcome: Progressing   Problem: Nutrition: Goal: Adequate nutrition will be maintained Outcome: Progressing   Problem: Coping: Goal: Level of anxiety will decrease Outcome: Progressing   

## 2020-08-19 NOTE — Progress Notes (Signed)
Initial Nutrition Assessment  RD working remotely.  DOCUMENTATION CODES:   Not applicable  INTERVENTION:  - diet advancement as medically feasible.   NUTRITION DIAGNOSIS:   Inadequate oral intake related to inability to eat as evidenced by NPO status  GOAL:   Patient will meet greater than or equal to 90% of their needs  MONITOR:   Diet advancement,Labs,Weight trends  REASON FOR ASSESSMENT:   Malnutrition Screening Tool  ASSESSMENT:   70 year old female with medical history of recently diagnosed stage IV R lung adenocarcinoma with brain mets, HTN, HLD, RBBB, depression, anxiety, hemorrhoids, and diverticulosis. She presented to the ED due to BRBPR, rectal pain, and constipation with last BM 4 days PTA despite enemas and milk of magnesia. GI consulted. Probable flex sig.  Patient is OBV status.   Diet advanced from NPO to CLD yesterday at 84, to [place] at 1120, and returned to NPO at midnight. Flow sheet documentation indicates that she consumed 100% of lunch and dinner.  She has not been seen by a [place] RD at any time in the past.   Weight on 2/18 was 109 lb, weight on 07/24/20 was 111 lb, and weight on 03/14/20 was 116 lb. This indicates 7 lb weight loss (6% body weight) in the past 5 months; not significant for time frame.   MD note from yesterday AM stated likely d/c in 2-3 days.    Labs reviewed. Medications reviewed; 1000 mcg oral ascorbic acid/day, 40 mg oral protonix BID, 17 g miralax BID, 1 tablet senokot BID.  IVF; NS @ 75 ml/hr.     NUTRITION - FOCUSED PHYSICAL EXAM:  unable to complete at this time.  Diet Order:   Diet Order            Diet NPO time specified Except for: Sips with Meds  Diet effective midnight                 EDUCATION NEEDS:   No education needs have been identified at this time  Skin:  Skin Assessment: Reviewed RN Assessment  Last BM:  2/19  Height:   Ht Readings from Last 1 Encounters:  08/17/20 5' (1.524 m)     Weight:   Wt Readings from Last 1 Encounters:  08/17/20 49.3 kg    Estimated Nutritional Needs:  Kcal:  1450-1650 kcal Protein:  70-80 grams Fluid:  >/= 1.6 L/day      Jarome Matin, MS, RD, LDN, CNSC Inpatient Clinical Dietitian RD pager # available in [facility]  After hours/weekend pager # available in [facility]

## 2020-08-19 NOTE — Progress Notes (Addendum)
Progress Note   Subjective  Feels much better. Multiple BMs over past 24 hours. Only traces of blood with last few BMs.    Objective  Vital signs in last 24 hours: Temp:  [98.2 F (36.8 C)-99 F (37.2 C)] 98.2 F (36.8 C) (02/20 9628) Pulse Rate:  [55-68] 56 (02/20 0632) Resp:  [14-16] 16 (02/20 3662) BP: (100-141)/(54-71) 124/54 (02/20 9476) SpO2:  [99 %-100 %] 100 % (02/20 5465) Last BM Date: 08/18/20  General: Alert, well-developed, elderly in NAD Heart:  Regular rate and rhythm; no murmurs Chest: Clear to ascultation bilaterally Abdomen:  Soft, nontender and nondistended. Normal bowel sounds, without guarding, and without rebound.   Extremities:  Without edema. Neurologic:  Alert and  oriented x4; grossly normal neurologically. Psych:  Alert and cooperative. Normal mood and affect.  Intake/Output from previous day: 02/19 0701 - 02/20 0700 In: 4124.7 [P.O.:1800; I.V.:2324.7] Out: 1200 [Urine:1200] Intake/Output this shift: No intake/output data recorded.  Lab Results: Recent Labs    08/17/20 1808 08/18/20 0358 08/18/20 1356 08/19/20 0321  WBC 18.4* 14.4*  --  8.8  HGB 12.0 10.8* 11.1* 10.2*  HCT 38.4 34.4* 35.4* 32.6*  PLT 236 225  --  198   BMET Recent Labs    08/17/20 1808 08/18/20 0358 08/19/20 0321  NA 133* 137 139  K 3.9 4.1 4.8  CL 91* 102 106  CO2 30 26 27   GLUCOSE 167* 105* 118*  BUN 24* 17 9  CREATININE 0.78 0.66 0.71  CALCIUM 10.2 9.4 9.3   LFT Recent Labs    08/17/20 1808  PROT 7.1  ALBUMIN 4.1  AST 23  ALT 15  ALKPHOS 44  BILITOT 0.4    Studies/Results: CT ABDOMEN PELVIS W CONTRAST  Result Date: 08/17/2020 CLINICAL DATA:  Heavy rectal bleeding. EXAM: CT ABDOMEN AND PELVIS WITH CONTRAST TECHNIQUE: Multidetector CT imaging of the abdomen and pelvis was performed using the standard protocol following bolus administration of intravenous contrast. CONTRAST:  1103mL OMNIPAQUE IOHEXOL 300 MG/ML  SOLN COMPARISON:  August 02, 2020  FINDINGS: Lower chest: No acute abnormality. Hepatobiliary: There is mild diffuse fatty infiltration of the liver parenchyma. No focal liver abnormality is seen. Ill-defined gallstones are seen within the lumen of a contracted gallbladder. Pancreas: Unremarkable. No pancreatic ductal dilatation or surrounding inflammatory changes. Spleen: Normal in size without focal abnormality. Adrenals/Urinary Tract: Adrenal glands are unremarkable. Kidneys are normal, without renal calculi, focal lesion, or hydronephrosis. The urinary bladder is moderately distended and otherwise normal in appearance. Stomach/Bowel: Stomach is within normal limits. Appendix appears normal. A large amount of stool is seen throughout the colon. This is most prominent within the distal sigmoid colon and rectum. No evidence of bowel wall thickening, distention, or inflammatory changes. Noninflamed diverticula are seen within the descending and sigmoid colon. Vascular/Lymphatic: Aortic atherosclerosis. No enlarged abdominal or pelvic lymph nodes. Reproductive: Status post hysterectomy. No adnexal masses. Other: No abdominal wall hernia or abnormality. No abdominopelvic ascites. Musculoskeletal: Extensive postoperative changes are seen at the levels of L3-L4 and L4-L5. Marked severity degenerative changes are noted at the level of L5-S1. IMPRESSION: 1. Large stool burden, most prominent within the distal sigmoid colon and rectum. 2. Colonic diverticulosis. 3. Cholelithiasis. 4. Extensive postoperative changes within the lower lumbar spine. 5. Marked severity degenerative changes at the level of L5-S1. 6. Aortic atherosclerosis. Aortic Atherosclerosis (ICD10-I70.0). Electronically Signed   By: Virgina Norfolk M.D.   On: 08/17/2020 20:44      Assessment & Recommendations  1. Hematochezia in setting of severe constipation, both are resolving. Stercoral ulcer suspected. Colonoscopy in November 2020 with prolapse related change and inflammation in  the rectum.  History of constipation, rectocele, pelvic floor dysfunction. Multiple BMs over past 24 hours with only traces of blood on last few BMs. Hgb appears to be stabilizing. Discussion with patient and her daughter in the room and they prefer to defer flex sigmoidoscopy as bleeding is resolving. They are both anxious to get home to prepare for Rad Therapy Thurs and craniotomy Friday. Continue Miralax at home titrated between 1 and 3 times daily. OK for discharge today from GI standpoint. Outpatient GI follow up with Dr. Hilarie Fredrickson as needed.    LOS: 0 days   Antanisha Mohs T. Fuller Plan MD 08/19/2020, 9:26 AM [phone]

## 2020-08-19 NOTE — Discharge Summary (Signed)
Physician Discharge Summary  Hannah [name] EXB:[id number] DOB: 10-Aug-1950 DOA: 08/17/2020  PCP: [name], MD  Admit date: 08/17/2020 Discharge date: 08/19/2020  Time spent: 55 minutes  Recommendations for Outpatient Follow-up:  1. Follow-up with [name], MD in 2 weeks.  On follow-up patient will need a CBC done to follow-up on H&H.  Patient's constipation will need to be followed up upon as patient was discharged on a bowel regimen. 2. Follow-up with Dr. Hilarie Fredrickson, GI in 3 to 4 weeks.   Discharge Diagnoses:  Principal Problem:   Hematochezia Active Problems:   Constipation   Rectal bleeding   Acute blood loss anemia   Mixed hyperlipidemia   HTN (hypertension)   PVD - bilateral 60-79% carotid strenosis   GERD (gastroesophageal reflux disease)   Brain metastasis (HCC)   Primary cancer of right upper lobe of lung ([place])   Discharge Condition: Stable and improved  Diet recommendation: Heart healthy  Filed Weights   08/17/20 2230  Weight: 49.3 kg    History of present illness:  HPI per Dr. Lazaro Arms is a 70 y.o. female with medical history significant for recently diagnosed stage IV right lung adenocarcinoma with brain metastases, HTN, HLD, RBBB, depression/anxiety, hemorrhoids and diverticulosis who presents to the ED for evaluation of rectal bleeding.  Patient stated she had been constipated for the last few days with last bowel movement 4 days ago.  She has tried enemas and milk of magnesia without significant improvement.  She had been noticing large amount of bright red blood per rectum when attempting to have bowel movements.  She has had associated rectal pain.  She denied any similar episodes in the past.  She does take a low-dose aspirin 81 mg daily.  She is not on any other blood thinners.  She otherwise denied any lightheadedness/dizziness, subjective fevers, chills, diaphoresis, nausea, vomiting, abdominal pain, or dysuria.  She has recently  diagnosed right lung adenocarcinoma with brain metastases with plans for SRS+ craniotomy next week.  She has been on Decadron 2 mg daily.  ED Course:  Initial vitals showed BP 132/79, pulse 94, RR 18, temp 97.4 F, SPO2 99% on room air.  Labs show WBC 18.4, hemoglobin 12.0, platelets 236,000, sodium 133, potassium 3.9, bicarb 30, BUN 24, creatinine 0.70, LFTs within normal limits, serum glucose 167.  FOBT is positive.  SARS-CoV-2 PCR is negative.  Influenza A/B PCR's are negative.  CT abdomen/pelvis with contrast shows large stool burden most prominent in the distal sigmoid colon and rectum.  Colonic diverticulosis, cholelithiasis also noted.  Patient was given 1 L normal saline.  EDP discussed with on-call GI who recommended medical admission for possible flexible sigmoidoscopy tomorrow.  The hospitalist service was consulted to admit for further evaluation management.  Hospital Course:  #1  Hematochezia likely secondary to severe constipation/rectal bleeding/GI bleed/acute blood loss anemia Patient had presented with hematochezia felt likely secondary to severe constipation and likely secondary to probable sterile coral ulcer which is suspected.   CT abdomen and pelvis done with large stool burden most prominent in the distal sigmoid colon and rectum.  Colonic diverticulosis noted.  Patient with ongoing rectal bleeding this morning.  Patient noted with history of known rectocele,  history of constipation, pelvic floor dysfunction.  Patient noted to have had a prior colonoscopy with prolapse related change and inflammation noted in the rectum.   Patient admitted monitored with serial hemoglobins.  Patient seen in consultation by GI.  Patient was noted  by GI on DRE to have impacted stool.  Patient was placed on a smog enema as well as a bowel regimen of MiraLAX twice daily, Senokot-S twice daily.  Patient had multiple bowel movements and had improved clinically.  Bleeding was resolving.   Hemoglobin stabilized at 10.2 by day of discharge.  Anemia panel which was obtained was consistent with anemia of chronic disease with iron of 26, TIBC of 523, ferritin of 122, folate of 16.8, vitamin B12 331.  Patient improved clinically and was to have a flexible sigmoidoscopy done however per GI note as patient's bleeding was resolving, improvement with constipation, patient and family preferred to defer flexible sigmoidoscopy at this time as they were both anxious to get home to prepare for radiation therapy and craniotomy.   Patient was cleared by GI as hemoglobin had stabilized and bleeding seem to be resolving for discharge.   Follow-up with GI in the outpatient setting.  2.  Recently diagnosed stage IV bronchogenic adenocarcinoma with brain mets Recently diagnosed and being followed by neuro oncology, Dr. Mickeal Skinner and radiation oncology Dr. Tammi Klippel.  Has not started treatment yet.  Per daughter plans for [facility] plus craniotomy next week.  Decadron held on admission but resumed during the hospitalization.  Outpatient follow-up with oncology.   3.  Leukocytosis Likely reactive versus secondary to underlying cancer and chronic steroid use.  Patient afebrile.  No signs of infection.  Patient with no pulmonary symptoms concerning for infiltrate.  No cough.  Patient denies any urinary symptoms.  Urinalysis with small leukocytes, nitrite negative, 6-10 WBCs.  Leukocytosis trended down and had resolved by day of discharge.  4.  Hypertension Stable.    Patient's HCTZ was held during the hospitalization secondary to problem #1.  Blood pressure remained stable.  HCTZ will be resumed 2 to 3 days post discharge.  Outpatient follow-up.    5.  Hyperlipidemia Statin was held during the hospitalization will be resumed on discharge.    6.  Depression/anxiety Patient maintained on home regimen of Xanax as needed.  7.    Severe constipation Patient presented with severe constipation felt leading to  hematochezia.  Patient was given a smog enema as well as placed on a bowel regimen of MiraLAX twice daily, Senokot-S twice daily.  Patient seen in consultation by GI who assessed the patient and had recommended a flexible sigmoidoscopy.  Patient had multiple good bowel movements with significant clinical improvement with improving hematochezia.  On day of discharge GI discussed with family and they would prefer to defer flexible sigmoidoscopy as bleeding was resolving.  Patient was cleared by GI for outpatient follow-up.  Patient will be discharged home on a bowel regimen of MiraLAX twice daily, Senokot-S twice daily.   Procedures:  CT abdomen and pelvis 08/17/2020    Consultations:  Gastroenterology: Dr. Hilarie Fredrickson 08/18/2020  Discharge Exam: Vitals:   08/18/20 2155 08/19/20 0632  BP: (!) 100/57 (!) 124/54  Pulse: (!) 55 (!) 56  Resp: 16 16  Temp: 98.2 F (36.8 C) 98.2 F (36.8 C)  SpO2: 99% 100%    General: NAD Cardiovascular: RRR Respiratory: CTAB  Discharge Instructions   Discharge Instructions    Diet - low sodium heart healthy   Complete by: As directed    Increase activity slowly   Complete by: As directed      Allergies as of 08/19/2020      Reactions   Amlodipine    Rash, swelling   Chantix [varenicline Tartrate] Other (See Comments)  Tongue swell,sob   Clarithromycin Rash   Lisinopril Hives   Simvastatin Hives   Wellbutrin [bupropion Hcl] Hives   Lipitor [atorvastatin]    Dizziness per patient   Sertraline    Makes her feel like she is going to kill someone      Medication List    TAKE these medications   ALPRAZolam 0.5 MG tablet Commonly known as: XANAX Take 1 tablet (0.5 mg total) by mouth 2 (two) times daily as needed. for sleep What changed: how much to take   aspirin 81 MG tablet Take 1 tablet (81 mg total) by mouth daily.   Benefiber Powd Take 2 Scoops by mouth in the morning and at bedtime.   calcium citrate-vitamin D 315-200 MG-UNIT  tablet Commonly known as: CITRACAL+D Take 1 tablet by mouth daily.   carboxymethylcellulose 0.5 % Soln Commonly known as: REFRESH PLUS Place 1-2 drops into both eyes daily.   dexamethasone 2 MG tablet Commonly known as: DECADRON Take 1 tablet (2 mg total) by mouth daily. Take with food. What changed: how much to take   fenofibrate 160 MG tablet Take 1 tablet (160 mg total) by mouth daily.   hydrochlorothiazide 12.5 MG tablet Commonly known as: HYDRODIURIL Take 1 tablet (12.5 mg total) by mouth daily. Start taking on: August 21, 2020 What changed: These instructions start on August 21, 2020. If you are unsure what to do until then, ask your doctor or other care provider.   magnesium hydroxide 400 MG/5ML suspension Commonly known as: MILK OF MAGNESIA Take 30 mLs by mouth daily as needed for mild constipation.   mirtazapine 15 MG tablet Commonly known as: Remeron Take 0.5 tablets (7.5 mg total) by mouth at bedtime.   naphazoline-glycerin 0.012-0.2 % Soln Commonly known as: CLEAR EYES REDNESS Place 1-2 drops into both eyes 4 (four) times daily as needed for eye irritation. bausch and lomb   omeprazole 40 MG capsule Commonly known as: PRILOSEC TAKE 1 CAPSULE TWICE DAILY BEFORE MEALS What changed: See the new instructions.   polyethylene glycol 17 g packet Commonly known as: MIRALAX / GLYCOLAX Take 17 g by mouth 2 (two) times daily.   pravastatin 40 MG tablet Commonly known as: PRAVACHOL TAKE 1 TABLET EVERY EVENING What changed: when to take this   PROBIOTIC DAILY PO Take 1 capsule by mouth daily.   senna-docusate 8.6-50 MG tablet Commonly known as: Senokot-S Take 1 tablet by mouth 2 (two) times daily.   VITAMIN C PO Take 1,000 mg by mouth daily.      Allergies  Allergen Reactions  . Amlodipine     Rash, swelling  . Chantix [Varenicline Tartrate] Other (See Comments)    Tongue swell,sob  . Clarithromycin Rash  . Lisinopril Hives  . Simvastatin Hives   . Wellbutrin [Bupropion Hcl] Hives  . Lipitor [Atorvastatin]     Dizziness per patient  . Sertraline     Makes her feel like she is going to kill someone    Follow-up Information    Pyrtle, Lajuan Lines, MD. Schedule an appointment as soon as possible for a visit in 3 week(s).   Specialty: Gastroenterology Why: f/u in 3-4 weeks. Contact information: [address] [place] [address] [phone]        [name], MD. Schedule an appointment as soon as possible for a visit in 2 week(s).   Specialty: Internal Medicine Contact information: [place] [address] [phone]  Minus Breeding, MD .   Specialty: Cardiology Contact information: [address] [place] [address] [phone]                The results of significant diagnostics from this hospitalization (including imaging, microbiology, ancillary and laboratory) are listed below for reference.    Significant Diagnostic Studies: MR Brain Wo Contrast  Result Date: 07/31/2020 CLINICAL DATA:  Memory changes. Memory loss. Additional history provided by scanning technologist: Memory changes, right-sided weakness, worsening over the past 2 weeks. EXAM: MRI HEAD WITHOUT CONTRAST TECHNIQUE: Multiplanar, multiecho pulse sequences of the brain and surrounding structures were obtained without intravenous contrast. COMPARISON:  Brain MRI 01/10/2019. FINDINGS: Brain: Cerebral volume is normal for age. There is a 1.5 cm lesion within the left superior frontal gyrus which is predominantly T2 hyperintense and T1 hypointense. There is corresponding restricted diffusion. Prominent surrounding vasogenic edema within the left frontal lobe and extending slightly into the corpus callosum. Associated mass effect with early rightward subfalcine herniation (series 12, image 17). 3 mm rightward midline shift at the level of the septum pellucidum. Partial effacement of the left lateral  ventricle. Background mild multifocal T2/FLAIR hyperintensity within the cerebral white matter is nonspecific, but compatible with chronic small vessel ischemic disease. There is no acute infarct. No chronic intracranial blood products. No extra-axial fluid collection. No midline shift. Vascular: Expected proximal arterial flow voids. Skull and upper cervical spine: No focal marrow lesion. Sinuses/Orbits: Visualized orbits show no acute finding. Trace ethmoid sinus mucosal thickening. These results were called by telephone at the time of interpretation on 07/31/2020 at 8:39 am to provider Carlos , who verbally acknowledged these results. IMPRESSION: 1.5 cm lesion within the left superior frontal gyrus with associated restricted diffusion and prominent surrounding vasogenic edema. This is favored to reflect a metastatic lesion. However, a brain abscess cannot be excluded. Postcontrast MR imaging of the brain is recommended for further evaluation. Associated mass effect with early rightward subfalcine herniation. 3 mm rightward midline shift at the level of the septum pellucidum. Partial effacement of the left lateral ventricle. Mild cerebral white matter chronic small vessel ischemic disease. Electronically Signed   By: Kellie Simmering DO   On: 07/31/2020 08:40   MR BRAIN W CONTRAST  Result Date: 08/02/2020 CLINICAL DATA:  Brain mass or lesion. EXAM: MRI HEAD WITH CONTRAST TECHNIQUE: Multiplanar, multiecho pulse sequences of the brain and surrounding structures were obtained with intravenous contrast. CONTRAST:  13mL MULTIHANCE GADOBENATE DIMEGLUMINE 529 MG/ML IV SOLN COMPARISON:  MRI of the brain July 31, 2020 FINDINGS: Postcontrast images of the brain show a lobulated left frontal lesion with necrotic center and irregular peripheral contrast enhancement measuring approximately 2.2 x 1.8 x 1.4 cm. Sulcal contrast enhancement is also noted posterior to the lesion (series 3, image 14). Prominent surrounding  edema with a 3-4 mm rightward midline shift are again noted. No other enhancing lesion identified. IMPRESSION: Rim enhancing necrotic lesion in the left frontal lobe. Differential diagnosis remains the same and favors metastatic disease. Abscess remains a consideration given restricted diffusion seen on prior MRI. Electronically Signed   By: Pedro Earls M.D.   On: 08/02/2020 13:02   MR Brain W Wo Contrast  Result Date: 08/15/2020 CLINICAL DATA:  SRS treatment planning. Necrotic left frontal lesion. EXAM: MRI HEAD WITHOUT AND WITH CONTRAST TECHNIQUE: Multiplanar, multiecho pulse sequences of the brain and surrounding structures were obtained without and with intravenous contrast. CONTRAST:  49mL MULTIHANCE GADOBENATE DIMEGLUMINE 529 MG/ML  IV SOLN COMPARISON:  Brain MRI 08/02/2020 and 07/31/2020 FINDINGS: Brain: Contrast-enhancing lesion in the left frontal lobe is unchanged in size measuring 1.8 x 1.3 cm (series 12, image 123). The degree of vasogenic edema surrounding the lesion has slightly decreased. There are no new lesions. Otherwise, there are scattered hyperintense T2-weighted signal foci within the white matter suggestive of chronic small vessel disease. Brain volume is normal for age. No acute or chronic hemorrhage. Vascular: Normal flow voids. Skull and upper cervical spine: Normal marrow signal. Sinuses/Orbits: Negative. Other: None. IMPRESSION: Unchanged size of left frontal lobe contrast-enhancing lesion with slightly decreased surrounding vasogenic edema. Electronically Signed   By: Ulyses Jarred M.D.   On: 08/15/2020 19:49   CT ABDOMEN PELVIS W CONTRAST  Result Date: 08/17/2020 CLINICAL DATA:  Heavy rectal bleeding. EXAM: CT ABDOMEN AND PELVIS WITH CONTRAST TECHNIQUE: Multidetector CT imaging of the abdomen and pelvis was performed using the standard protocol following bolus administration of intravenous contrast. CONTRAST:  128mL OMNIPAQUE IOHEXOL 300 MG/ML  SOLN COMPARISON:   August 02, 2020 FINDINGS: Lower chest: No acute abnormality. Hepatobiliary: There is mild diffuse fatty infiltration of the liver parenchyma. No focal liver abnormality is seen. Ill-defined gallstones are seen within the lumen of a contracted gallbladder. Pancreas: Unremarkable. No pancreatic ductal dilatation or surrounding inflammatory changes. Spleen: Normal in size without focal abnormality. Adrenals/Urinary Tract: Adrenal glands are unremarkable. Kidneys are normal, without renal calculi, focal lesion, or hydronephrosis. The urinary bladder is moderately distended and otherwise normal in appearance. Stomach/Bowel: Stomach is within normal limits. Appendix appears normal. A large amount of stool is seen throughout the colon. This is most prominent within the distal sigmoid colon and rectum. No evidence of bowel wall thickening, distention, or inflammatory changes. Noninflamed diverticula are seen within the descending and sigmoid colon. Vascular/Lymphatic: Aortic atherosclerosis. No enlarged abdominal or pelvic lymph nodes. Reproductive: Status post hysterectomy. No adnexal masses. Other: No abdominal wall hernia or abnormality. No abdominopelvic ascites. Musculoskeletal: Extensive postoperative changes are seen at the levels of L3-L4 and L4-L5. Marked severity degenerative changes are noted at the level of L5-S1. IMPRESSION: 1. Large stool burden, most prominent within the distal sigmoid colon and rectum. 2. Colonic diverticulosis. 3. Cholelithiasis. 4. Extensive postoperative changes within the lower lumbar spine. 5. Marked severity degenerative changes at the level of L5-S1. 6. Aortic atherosclerosis. Aortic Atherosclerosis (ICD10-I70.0). Electronically Signed   By: Virgina Norfolk M.D.   On: 08/17/2020 20:44   CT CHEST ABDOMEN PELVIS W CONTRAST  Result Date: 08/02/2020 CLINICAL DATA:  New brain lesion on MRI.  Weight loss. EXAM: CT CHEST, ABDOMEN, AND PELVIS WITH CONTRAST TECHNIQUE: Multidetector CT  imaging of the chest, abdomen and pelvis was performed following the standard protocol during bolus administration of intravenous contrast. CONTRAST:  166mL OMNIPAQUE IOHEXOL 300 MG/ML  SOLN COMPARISON:  None. FINDINGS: CT CHEST FINDINGS Cardiovascular: Normal heart size. No significant pericardial effusion/thickening. Left anterior descending and left circumflex coronary atherosclerosis. Atherosclerotic nonaneurysmal thoracic aorta. Normal caliber pulmonary arteries. No central pulmonary emboli. Mediastinum/Nodes: Subcentimeter hypodense right thyroid nodule. Not clinically significant; no follow-up imaging recommended (ref: J Am Coll Radiol. 2015 Feb;12(2): 143-50). Unremarkable esophagus. No pathologically enlarged axillary, mediastinal or hilar lymph nodes. Lungs/Pleura: No pneumothorax. No pleural effusion. Irregular solid 5.4 x 4.4 cm peripheral right upper lobe lung mass abutting and mildly distorting the minor fissure (series 3/image 40). Tiny solid 0.2 cm lingular pulmonary nodule (series 3/image 70). No additional significant pulmonary nodules. Mild centrilobular and paraseptal emphysema. Musculoskeletal: No aggressive appearing  focal osseous lesions. Moderate thoracic spondylosis. CT ABDOMEN PELVIS FINDINGS Hepatobiliary: Normal liver with no liver mass. Cholelithiasis. Contracted gallbladder with no definite gallbladder wall thickening. No biliary ductal dilatation. Pancreas: Normal, with no mass or duct dilation. Spleen: Normal size. No mass. Adrenals/Urinary Tract: Normal adrenals. Normal kidneys with no hydronephrosis and no renal mass. Normal bladder. Stomach/Bowel: Normal non-distended stomach. Normal caliber small bowel with no small bowel wall thickening. Normal appendix. Oral contrast transits to the colon. Normal large bowel with no diverticulosis, large bowel wall thickening or pericolonic fat stranding. Vascular/Lymphatic: Atherosclerotic abdominal aorta with ectatic 2.6 cm infrarenal  abdominal aorta. Patent portal, splenic, hepatic and renal veins. No pathologically enlarged lymph nodes in the abdomen or pelvis. Reproductive: Status post hysterectomy, with no abnormal findings at the vaginal cuff. No adnexal mass. Other: No pneumoperitoneum, ascites or focal fluid collection. Musculoskeletal: No aggressive appearing focal osseous lesions. Moderate multilevel lumbar degenerative disc disease. Interbody spacers at L3-4 and L4-5. Surgical hardware between the L3-4 and L4-5 spinous processes. IMPRESSION: 1. Irregular solid 5.4 cm peripheral right upper lobe lung mass abutting and mildly distorting the minor fissure, compatible with primary bronchogenic carcinoma. Multi disciplinary thoracic oncology consultation suggested. 2. Tiny 0.2 cm lingular pulmonary nodule, for which follow-up chest CT is recommended in 3 months. 3. No additional potential findings of metastatic disease in the chest, abdomen or pelvis. No thoracic adenopathy. 4. Two-vessel coronary atherosclerosis. 5. Cholelithiasis. 6. Ectatic 2.6 cm infrarenal abdominal aorta. Recommend follow-up ultrasound every 5 years. This recommendation follows ACR consensus guidelines: White Paper of the ACR Incidental Findings Committee II on Vascular Findings. J Am Coll Radiol 2013; 10:789-794. 7. Aortic Atherosclerosis (ICD10-I70.0) and Emphysema (ICD10-J43.9). Electronically Signed   By: Ilona Sorrel M.D.   On: 08/02/2020 17:37   NM PET Image Initial (PI) Skull Base To Thigh  Result Date: 08/08/2020 CLINICAL DATA:  Initial treatment strategy for recently diagnosed right upper lobe lung cancer. Intracranial metastatic disease. EXAM: NUCLEAR MEDICINE PET SKULL BASE TO THIGH TECHNIQUE: 5.2 mCi F-18 FDG was injected intravenously. Full-ring PET imaging was performed from the skull base to thigh after the radiotracer. CT data was obtained and used for attenuation correction and anatomic localization. Fasting blood glucose: 87 mg/dl COMPARISON:   Chest CT 08/02/2020. FINDINGS: Mediastinal blood pool activity: SUV max 2.4 NECK: No hypermetabolic cervical lymph nodes are identified.There is asymmetric hypermetabolic activity in the region of the right arytenoid cartilage (SUV max 6.4). No focal corresponding mucosal abnormality identified on the CT images. No other lesions of the pharyngeal mucosal space. Incidental CT findings: Bilateral carotid atherosclerosis. CHEST: There are no hypermetabolic mediastinal, hilar or axillary lymph nodes. The previously demonstrated large peripheral right upper lobe mass demonstrates peripheral hypermetabolic activity with an SUV max of 11.0. This lesion is centrally necrotic with areas of fluid density and gas. It measures approximately 5.3 x 4.3 cm on image 26/8. The lesion extends to the visceral pleural surface, although demonstrates no chest wall invasion. Tiny lingular nodule on image 38/8 is unchanged. Incidental CT findings: Mild centrilobular and paraseptal emphysema. Mild atherosclerosis of the aorta, great vessels and coronary arteries. ABDOMEN/PELVIS: There is no hypermetabolic activity within the liver, adrenal glands, spleen or pancreas. There is no hypermetabolic nodal activity. There is nonspecific low-level and erectile activity (SUV max 6.2). No other abnormal bowel activity. Incidental CT findings: Calcified gallstones. Aortic and branch vessel atherosclerosis. A degree of rectal prolapse is suspected. SKELETON: There is no hypermetabolic activity to suggest osseous metastatic disease. Incidental CT findings:  none IMPRESSION: 1. The previously demonstrated large right upper lobe mass is peripherally hypermetabolic, consistent with bronchogenic carcinoma. This lesion demonstrates progressive central necrosis. 2. No evidence of metastatic disease. By imaging, this corresponds with stage II B disease (T3 N0 M0). 3. Nonspecific asymmetric activity associated with the arytenoid cartilage, potentially  physiologic. Correlate with direct visualization if there are symptoms referable to this region. 4. Nonspecific anorectal activity, suspected to be related to a degree of rectal prolapse. Correlate clinically. 5. Incidental findings as previously described, including cholelithiasis and Aortic Atherosclerosis (ICD10-I70.0). Electronically Signed   By: Richardean Sale M.D.   On: 08/08/2020 16:06   CT Biopsy  Result Date: 08/10/2020 INDICATION: Right upper lobe pulmonary mass with brain Mets concerning for metastatic lung disease. She presents for CT-guided biopsy of the lung mass. EXAM: CT-guided biopsy right upper lobe pulmonary nodule Interventional Radiologist:  Criselda Peaches, MD MEDICATIONS: None. ANESTHESIA/SEDATION: Fentanyl 50 mcg IV; Versed 1.5 mg IV Moderate Sedation Time:  15 minutes The patient was continuously monitored during the procedure by the interventional radiology nurse under my direct supervision. FLUOROSCOPY TIME:  None. COMPLICATIONS: None immediate. Estimated blood loss:  0 PROCEDURE: Informed written consent was obtained from the patient after a thorough discussion of the procedural risks, benefits and alternatives. All questions were addressed. Maximal Sterile Barrier Technique was utilized including caps, mask, sterile gowns, sterile gloves, sterile drape, hand hygiene and skin antiseptic. A timeout was performed prior to the initiation of the procedure. A planning axial CT scan was performed. The nodule in the right upper lobe was successfully identified. A suitable skin entry site was selected and marked. The region was then sterilely prepped and draped in standard fashion using Betadine skin prep. Local anesthesia was attained by infiltration with 1% lidocaine. A small dermatotomy was made. Under intermittent CT fluoroscopic guidance, a 17 gauge trocar needle was advanced into the lung and positioned at the margin of the nodule. Multiple 18 gauge core biopsies were then  coaxially obtained using the BioPince automated biopsy device. Biopsy specimens were placed in formalin and delivered to pathology for further analysis. The biopsy device and introducer needle were removed. A bio sentry device was deployed. Post biopsy axial CT imaging demonstrates no evidence of immediate complication. There is no pneumothorax. The patient tolerated the procedure well. IMPRESSION: Technically successful CT-guided biopsy right upper lobe pulmonary nodule. Electronically Signed   By: Jacqulynn Cadet M.D.   On: 08/10/2020 14:51   DG Chest Port 1 View  Result Date: 08/10/2020 CLINICAL DATA:  Status post lung biopsy EXAM: PORTABLE CHEST 1 VIEW COMPARISON:  Chest radiograph May 18, 2018; PET-CT August 08, 2020 FINDINGS: No pneumothorax. Right upper lobe mass again noted measuring 4.6 x 3.9 cm. Lungs elsewhere clear. Heart size and pulmonary vascularity are normal. No adenopathy. No bone lesions. IMPRESSION: No pneumothorax. Essentially stable mass right upper lobe. Lungs elsewhere clear. Cardiac silhouette within normal limits. Electronically Signed   By: Lowella Grip III M.D.   On: 08/10/2020 11:05    Microbiology: Recent Results (from the past 240 hour(s))  Resp Panel by RT-PCR (Flu A&B, Covid) Nasopharyngeal Swab     Status: None   Collection Time: 08/17/20  7:30 PM   Specimen: Nasopharyngeal Swab; Nasopharyngeal(NP) swabs in vial transport medium  Result Value Ref Range Status   SARS Coronavirus 2 by RT PCR NEGATIVE NEGATIVE Final    Comment: (NOTE) SARS-CoV-2 target nucleic acids are NOT DETECTED.  The SARS-CoV-2 RNA is generally detectable in upper  respiratory specimens during the acute phase of infection. The lowest concentration of SARS-CoV-2 viral copies this assay can detect is 138 copies/mL. A negative result does not preclude SARS-Cov-2 infection and should not be used as the sole basis for treatment or other patient management decisions. A negative result  may occur with  improper specimen collection/handling, submission of specimen other than nasopharyngeal swab, presence of viral mutation(s) within the areas targeted by this assay, and inadequate number of viral copies(<138 copies/mL). A negative result must be combined with clinical observations, patient history, and epidemiological information. The expected result is Negative.  Fact Sheet for Patients:  EntrepreneurPulse.com.au  Fact Sheet for Healthcare Providers:  IncredibleEmployment.be  This test is no t yet approved or cleared by the [place] FDA and  has been authorized for detection and/or diagnosis of SARS-CoV-2 by FDA under an Emergency Use Authorization (EUA). This EUA will remain  in effect (meaning this test can be used) for the duration of the COVID-19 declaration under Section 564(b)(1) of the Act, 21 U.S.C.section 360bbb-3(b)(1), unless the authorization is terminated  or revoked sooner.       Influenza A by PCR NEGATIVE NEGATIVE Final   Influenza B by PCR NEGATIVE NEGATIVE Final    Comment: (NOTE) The Xpert Xpress SARS-CoV-2/FLU/RSV plus assay is intended as an aid in the diagnosis of influenza from Nasopharyngeal swab specimens and should not be used as a sole basis for treatment. Nasal washings and aspirates are unacceptable for Xpert Xpress SARS-CoV-2/FLU/RSV testing.  Fact Sheet for Patients: EntrepreneurPulse.com.au  Fact Sheet for Healthcare Providers: IncredibleEmployment.be  This test is not yet approved or cleared by the [place] FDA and has been authorized for detection and/or diagnosis of SARS-CoV-2 by FDA under an Emergency Use Authorization (EUA). This EUA will remain in effect (meaning this test can be used) for the duration of the COVID-19 declaration under Section 564(b)(1) of the Act, 21 U.S.C. section 360bbb-3(b)(1), unless the authorization is terminated  or revoked.  Performed at [facility], [place] [address], [place], [place] [address]      Labs: Basic Metabolic Panel: Recent Labs  Lab 08/17/20 1808 08/18/20 0358 08/19/20 0321  NA 133* 137 139  K 3.9 4.1 4.8  CL 91* 102 106  CO2 30 26 27   GLUCOSE 167* 105* 118*  BUN 24* 17 9  CREATININE 0.78 0.66 0.71  CALCIUM 10.2 9.4 9.3  MG  --   --  2.4   Liver Function Tests: Recent Labs  Lab 08/17/20 1808  AST 23  ALT 15  ALKPHOS 44  BILITOT 0.4  PROT 7.1  ALBUMIN 4.1   No results for input(s): LIPASE, AMYLASE in the last 168 hours. No results for input(s): AMMONIA in the last 168 hours. CBC: Recent Labs  Lab 08/17/20 1808 08/18/20 0358 08/18/20 1356 08/19/20 0321  WBC 18.4* 14.4*  --  8.8  NEUTROABS 14.5*  --   --  7.0  HGB 12.0 10.8* 11.1* 10.2*  HCT 38.4 34.4* 35.4* 32.6*  MCV 83.8 83.7  --  85.6  PLT 236 225  --  198   Cardiac Enzymes: No results for input(s): CKTOTAL, CKMB, CKMBINDEX, TROPONINI in the last 168 hours. BNP: BNP (last 3 results) No results for input(s): BNP in the last 8760 hours.  ProBNP (last 3 results) No results for input(s): PROBNP in the last 8760 hours.  CBG: No results for input(s): GLUCAP in the last 168 hours.     Signed:  Irine Seal MD.  [facility] 08/19/2020, 10:41 AM

## 2020-08-19 NOTE — Progress Notes (Signed)
Pt tolerated breakfast well. No needs at this time.

## 2020-08-20 ENCOUNTER — Ambulatory Visit (HOSPITAL_COMMUNITY): Payer: Medicare HMO

## 2020-08-20 DIAGNOSIS — C7931 Secondary malignant neoplasm of brain: Secondary | ICD-10-CM | POA: Diagnosis not present

## 2020-08-20 DIAGNOSIS — C3411 Malignant neoplasm of upper lobe, right bronchus or lung: Secondary | ICD-10-CM | POA: Diagnosis not present

## 2020-08-21 ENCOUNTER — Encounter: Payer: Self-pay | Admitting: Physician Assistant

## 2020-08-21 ENCOUNTER — Other Ambulatory Visit: Payer: Self-pay

## 2020-08-21 ENCOUNTER — Inpatient Hospital Stay: Payer: Medicare HMO | Admitting: Physician Assistant

## 2020-08-21 ENCOUNTER — Ambulatory Visit: Payer: Medicare HMO | Admitting: Radiation Oncology

## 2020-08-21 VITALS — BP 131/63 | HR 59 | Temp 98.8°F | Resp 12 | Ht 65.0 in | Wt 105.9 lb

## 2020-08-21 DIAGNOSIS — I10 Essential (primary) hypertension: Secondary | ICD-10-CM | POA: Diagnosis present

## 2020-08-21 DIAGNOSIS — R634 Abnormal weight loss: Secondary | ICD-10-CM | POA: Diagnosis not present

## 2020-08-21 DIAGNOSIS — C3490 Malignant neoplasm of unspecified part of unspecified bronchus or lung: Secondary | ICD-10-CM | POA: Diagnosis present

## 2020-08-21 DIAGNOSIS — C7931 Secondary malignant neoplasm of brain: Secondary | ICD-10-CM | POA: Diagnosis present

## 2020-08-21 DIAGNOSIS — I4510 Unspecified right bundle-branch block: Secondary | ICD-10-CM | POA: Diagnosis not present

## 2020-08-21 DIAGNOSIS — C3491 Malignant neoplasm of unspecified part of right bronchus or lung: Secondary | ICD-10-CM | POA: Diagnosis not present

## 2020-08-21 DIAGNOSIS — E785 Hyperlipidemia, unspecified: Secondary | ICD-10-CM | POA: Diagnosis present

## 2020-08-21 DIAGNOSIS — Z87891 Personal history of nicotine dependence: Secondary | ICD-10-CM | POA: Diagnosis not present

## 2020-08-21 DIAGNOSIS — Z8042 Family history of malignant neoplasm of prostate: Secondary | ICD-10-CM | POA: Diagnosis not present

## 2020-08-21 DIAGNOSIS — R220 Localized swelling, mass and lump, head: Secondary | ICD-10-CM | POA: Diagnosis not present

## 2020-08-21 DIAGNOSIS — G936 Cerebral edema: Secondary | ICD-10-CM | POA: Diagnosis not present

## 2020-08-21 DIAGNOSIS — K219 Gastro-esophageal reflux disease without esophagitis: Secondary | ICD-10-CM | POA: Diagnosis present

## 2020-08-21 DIAGNOSIS — Z9889 Other specified postprocedural states: Secondary | ICD-10-CM | POA: Diagnosis not present

## 2020-08-21 DIAGNOSIS — I739 Peripheral vascular disease, unspecified: Secondary | ICD-10-CM | POA: Diagnosis present

## 2020-08-21 DIAGNOSIS — D496 Neoplasm of unspecified behavior of brain: Secondary | ICD-10-CM | POA: Diagnosis not present

## 2020-08-21 DIAGNOSIS — G9389 Other specified disorders of brain: Secondary | ICD-10-CM | POA: Diagnosis not present

## 2020-08-21 DIAGNOSIS — C3411 Malignant neoplasm of upper lobe, right bronchus or lung: Secondary | ICD-10-CM

## 2020-08-21 DIAGNOSIS — C711 Malignant neoplasm of frontal lobe: Secondary | ICD-10-CM | POA: Diagnosis not present

## 2020-08-21 DIAGNOSIS — D62 Acute posthemorrhagic anemia: Secondary | ICD-10-CM | POA: Diagnosis not present

## 2020-08-21 DIAGNOSIS — Z833 Family history of diabetes mellitus: Secondary | ICD-10-CM | POA: Diagnosis not present

## 2020-08-21 DIAGNOSIS — Z823 Family history of stroke: Secondary | ICD-10-CM | POA: Diagnosis not present

## 2020-08-21 DIAGNOSIS — Z20822 Contact with and (suspected) exposure to covid-19: Secondary | ICD-10-CM | POA: Diagnosis present

## 2020-08-21 DIAGNOSIS — Z85118 Personal history of other malignant neoplasm of bronchus and lung: Secondary | ICD-10-CM | POA: Diagnosis not present

## 2020-08-21 DIAGNOSIS — Z803 Family history of malignant neoplasm of breast: Secondary | ICD-10-CM | POA: Diagnosis not present

## 2020-08-21 NOTE — Patient Instructions (Signed)
Summary:  -There are two main categories of lung cancer, they are named based on the size of the cancer cell. One is called Non-Small cell lung cancer. The other type is Small Cell Lung Cancer -The sample (biopsy) that they took of your tumor was consistent with a subtype of Non-small cell lung cancer called Adenocarcinoma. This is the most common type of lung cancer.  -We covered a lot of important information at your appointment today regarding what the treatment plan is moving forward. Here are the the main points that were discussed at your office visit with [place] today:  -Please continue to follow with radiation oncology regarding treatment to the spot in the brain -We will refer you to a surgeon to discuss with you possibly taking out the spot in the lung.  -I will put in a referral to a nutritionist.  -We will see you in about a month to touch base and to discuss additional plan for treatment after surgery.   Follow up:  -If you need to reach [place] at any time, the main office number to the cancer center is [phone], when you call, ask to speak to either Cassie's or Dr. Worthy Flank nurse.

## 2020-08-21 NOTE — Pre-Procedure Instructions (Signed)
Surgical Instructions    Your procedure is scheduled on Friday, February 25th.  Report to [facility] Main Entrance "A" at 10:00 A.M., then check in with the Admitting office.  Call this number if you have problems the morning of surgery:  [phone]   If you have any questions prior to your surgery date call [phone]: Open Monday-Friday 8am-4pm    Remember:  Do not eat or drink after midnight the night before your surgery    Take these medicines the morning of surgery with A SIP OF WATER  ALPRAZolam (XANAX) dexamethasone (DECADRON) carboxymethylcellulose (REFRESH PLUS) omeprazole (PRILOSEC)  pravastatin (PRAVACHOL)  naphazoline-glycerin (CLEAR EYES REDNESS)-use as needed  Follow your surgeon's instructions on when to stop Aspirin.  If no instructions were given by your surgeon then you will need to call the office to get those instructions.    As of today, STOP taking any Aspirin (unless otherwise instructed by your surgeon) Aleve, Naproxen, Ibuprofen, Motrin, Advil, Goody's, BC's, all herbal medications, fish oil, and all vitamins.                     Do not wear jewelry, make up, or nail polish            Do not wear lotions, powders, perfumes, or deodorant.            Do not shave 48 hours prior to surgery.              Do not bring valuables to the hospital.            [facility] is not responsible for any belongings or valuables.  Do NOT Smoke (Tobacco/Vaping) or drink Alcohol 24 hours prior to your procedure If you use a CPAP at night, you may bring all equipment for your overnight stay.   Contacts, glasses, dentures or bridgework may not be worn into surgery, please bring cases for these belongings   For patients admitted to the hospital, discharge time will be determined by your treatment team.   Patients discharged the day of surgery will not be allowed to drive home, and someone needs to stay with them for 24 hours.    Special instructions:   [facility]-  Preparing For Surgery  Before surgery, you can play an important role. Because skin is not sterile, your skin needs to be as free of germs as possible. You can reduce the number of germs on your skin by washing with CHG (chlorahexidine gluconate) Soap before surgery.  CHG is an antiseptic cleaner which kills germs and bonds with the skin to continue killing germs even after washing.    Oral Hygiene is also important to reduce your risk of infection.  Remember - BRUSH YOUR TEETH THE MORNING OF SURGERY WITH YOUR REGULAR TOOTHPASTE  Please do not use if you have an allergy to CHG or antibacterial soaps. If your skin becomes reddened/irritated stop using the CHG.  Do not shave (including legs and underarms) for at least 48 hours prior to first CHG shower. It is OK to shave your face.  Please follow these instructions carefully.   1. Shower the NIGHT BEFORE SURGERY and the MORNING OF SURGERY  2. If you chose to wash your hair, wash your hair first as usual with your normal shampoo.  3. After you shampoo, rinse your hair and body thoroughly to remove the shampoo.  4. Use CHG Soap as you would any other liquid soap. You can apply CHG directly  to the skin and wash gently with a scrungie or a clean washcloth.   5. Apply the CHG Soap to your body ONLY FROM THE NECK DOWN.  Do not use on open wounds or open sores. Avoid contact with your eyes, ears, mouth and genitals (private parts). Wash Face and genitals (private parts)  with your normal soap.   6. Wash thoroughly, paying special attention to the area where your surgery will be performed.  7. Thoroughly rinse your body with warm water from the neck down.  8. DO NOT shower/wash with your normal soap after using and rinsing off the CHG Soap.  9. Pat yourself dry with a CLEAN TOWEL.  10. Wear CLEAN PAJAMAS to bed the night before surgery  11. Place CLEAN SHEETS on your bed the night before your surgery  12. DO NOT SLEEP WITH PETS.   Day of  Surgery: Wear Clean/Comfortable clothing the morning of surgery Do not apply any deodorants/lotions.   Remember to brush your teeth WITH YOUR REGULAR TOOTHPASTE.   Please read over the following fact sheets that you were given.

## 2020-08-22 ENCOUNTER — Other Ambulatory Visit (HOSPITAL_COMMUNITY)
Admission: RE | Admit: 2020-08-22 | Discharge: 2020-08-22 | Disposition: A | Discharge status: Home / Self Care | Payer: Medicare HMO | Source: Ambulatory Visit | Attending: Neurological Surgery | Admitting: Neurological Surgery

## 2020-08-22 ENCOUNTER — Encounter (HOSPITAL_COMMUNITY): Payer: Self-pay

## 2020-08-22 ENCOUNTER — Telehealth: Payer: Self-pay | Admitting: Physician Assistant

## 2020-08-22 ENCOUNTER — Encounter (HOSPITAL_COMMUNITY)
Admission: RE | Admit: 2020-08-22 | Discharge: 2020-08-22 | Disposition: A | Discharge status: Home / Self Care | Payer: Medicare HMO | Source: Ambulatory Visit | Attending: Neurological Surgery | Admitting: Neurological Surgery

## 2020-08-22 DIAGNOSIS — Z01812 Encounter for preprocedural laboratory examination: Secondary | ICD-10-CM | POA: Insufficient documentation

## 2020-08-22 DIAGNOSIS — Z20822 Contact with and (suspected) exposure to covid-19: Secondary | ICD-10-CM | POA: Insufficient documentation

## 2020-08-22 LAB — TYPE AND SCREEN
ABO/RH(D): B POS
Antibody Screen: NEGATIVE

## 2020-08-22 LAB — SARS CORONAVIRUS 2 (TAT 6-24 HRS): SARS Coronavirus 2: NEGATIVE

## 2020-08-22 NOTE — Progress Notes (Signed)
PCP - Billey Gosling Cardiologist - Dr. Percival Spanish See last note on 05/02/19  Chest x-ray - 08/10/20 (1 view) EKG - 08/17/20 ECHO - 2015  Aspirin Instructions: last dose on 08/20/20  COVID TEST- 08/22/20   Anesthesia review: yes, heart history See Dr. Rosezella Florida note on 05/02/19  Patient denies shortness of breath, fever, cough and chest pain at PAT appointment   All instructions explained to the patient, with a verbal understanding of the material. Patient agrees to go over the instructions while at home for a better understanding. Patient also instructed to self quarantine after being tested for COVID-19. The opportunity to ask questions was provided.   Patient's daughter concerned that she was not able to come back with mother at PAT appointment.  At PAT appointment, patient was able to answer questions appropriately and showed no signs of impairment or confusion.

## 2020-08-22 NOTE — Telephone Encounter (Signed)
Scheduled appointments per 2/22 los. Spoke to patient's daughter who is aware of appointments dates and times.

## 2020-08-23 ENCOUNTER — Other Ambulatory Visit: Payer: Self-pay

## 2020-08-23 ENCOUNTER — Ambulatory Visit
Admission: RE | Admit: 2020-08-23 | Discharge: 2020-08-23 | Disposition: A | Discharge status: Home / Self Care | Payer: Medicare HMO | Source: Ambulatory Visit | Attending: Radiation Oncology | Admitting: Radiation Oncology

## 2020-08-23 ENCOUNTER — Telehealth: Payer: Self-pay | Admitting: Radiation Oncology

## 2020-08-23 ENCOUNTER — Encounter: Payer: Self-pay | Admitting: Radiation Oncology

## 2020-08-23 VITALS — BP 126/67 | HR 61 | Temp 97.9°F | Resp 18

## 2020-08-23 DIAGNOSIS — C7931 Secondary malignant neoplasm of brain: Secondary | ICD-10-CM | POA: Diagnosis not present

## 2020-08-23 DIAGNOSIS — Z85118 Personal history of other malignant neoplasm of bronchus and lung: Secondary | ICD-10-CM | POA: Diagnosis not present

## 2020-08-23 DIAGNOSIS — C3411 Malignant neoplasm of upper lobe, right bronchus or lung: Secondary | ICD-10-CM

## 2020-08-23 NOTE — Progress Notes (Signed)
Nurse monitoring following SRS treatment complete. Patient accompanied by her daughter, Hannah [name], who is presently at the bedside. Vitals stable. Patient is alert and oriented x 3. No distress noted. Denies any new or worsening neurologic symptoms. Answered all patient and daughter questions to the best of my ability. Instructed patient to avoid strenuous activity for the next 24 hours. Explained that should she develop concerns, new neurologic symptoms or an unrelieved headache she should call [phone] and request the radiation oncologist on call. Patient and daughter verbalized understanding. Patient discharge home with her daughter being pushed in a wheelchair. No distress noted at discharge.  BP 126/67 (BP Location: Right Arm, Patient Position: Sitting, Cuff Size: Normal)   Pulse 61   Temp 97.9 F (36.6 C)   Resp 18   SpO2 100%

## 2020-08-23 NOTE — Progress Notes (Signed)
Anesthesia Chart Review:  Follows with cardiologist Dr. Percival Spanish for history of carotid disease and mild valvular disease, mild aortic sclerosis with mild to moderate aortic insufficiency.  Last seen 05/02/2019, doing well from cardiac standpoint.  Recent diagnosis of stage IV non-small cell lung cancer with solitary brain metastasis.  Diagnosed in February 2022.  Preop labs reviewed, mild anemia with hemoglobin 10.2, otherwise unremarkable.  EKG 08/17/2020: Sinus rhythm. Rate 62.  Consider left atrial argument.  Right bundle branch block and left anterior fascicular block.  LVH.  CT chest abdomen pelvis 08/02/2020: IMPRESSION: 1. Irregular solid 5.4 cm peripheral right upper lobe lung mass abutting and mildly distorting the minor fissure, compatible with primary bronchogenic carcinoma. Multi disciplinary thoracic oncology consultation suggested. 2. Tiny 0.2 cm lingular pulmonary nodule, for which follow-up chest CT is recommended in 3 months. 3. No additional potential findings of metastatic disease in the chest, abdomen or pelvis. No thoracic adenopathy. 4. Two-vessel coronary atherosclerosis. 5. Cholelithiasis. 6. Ectatic 2.6 cm infrarenal abdominal aorta. Recommend follow-up ultrasound every 5 years. This recommendation follows ACR consensus guidelines: White Paper of the ACR Incidental Findings Committee II on Vascular Findings. J Am Coll Radiol 2013; 10:789-794. 7. Aortic Atherosclerosis (ICD10-I70.0) and Emphysema (ICD10-J43.9).  Carotid duplex 03/16/2020: Summary:  Right Carotid: Velocities in the right ICA are consistent with a 40-59% stenosis.  Left Carotid: Velocities in the left ICA are consistent with a 60-79% stenosis.  Vertebrals: Bilateral vertebral arteries demonstrate antegrade flow.  Subclavians: Bilateral subclavian artery flow was disturbed.   *See table(s) above for measurements and observations.  Suggest follow up study in 12 months.   TTE 11/29/2013: - Left  ventricle: The cavity size was normal. Wall thickness was  normal. Systolic function was normal. The estimated ejection  fraction was in the range of 55% to 60%. Wall motion was normal;  there were no regional wall motion abnormalities. Doppler  parameters are consistent with abnormal left ventricular  relaxation (grade 1 diastolic dysfunction).  - Aortic valve: Trileaflet; mildly calcified leaflets. There was  mild to moderate regurgitation.  - Mitral valve: Mildly calcified annulus. Mildly calcified leaflets  . There was mild regurgitation.  - Left atrium: The atrium was mildly dilated.  - Right ventricle: The cavity size was normal. Systolic function  was normal.  - Right atrium: The atrium was mildly dilated.  - Tricuspid valve: Peak RV-RA gradient (S): 25 mm Hg.  - Pulmonary arteries: PA peak pressure: 28 mm Hg (S).  - Inferior vena cava: The vessel was normal in size. The  respirophasic diameter changes were in the normal range (>= 50%),  consistent with normal central venous pressure.   Impressions:   - Normal LV size and systolic function, EF 62-13%. Normal RV size  and systolic function. Mild to moderate AI. Mild MR.   Wynonia Musty [facility]/Anesthesiology Phone [phone] 08/23/2020 10:07 AM

## 2020-08-23 NOTE — Telephone Encounter (Signed)
Patient scheduled for pre op srs treatment today. Received voicemail from patient's daughter, [name], inquiring about restrictions. Phoned her back promptly. Explained no foods or medications need to be held prior to [facility]; normal activity is fine. Explained that following treatment today she will remain in nursing for monitoring (approximately 30 minutes). Explained her only restriction following treatment will be to avoid strenuous activity for the next 24 hours. [name] verbalized understanding and appreciation for the return call.

## 2020-08-23 NOTE — Anesthesia Preprocedure Evaluation (Addendum)
Anesthesia Evaluation  Patient identified by MRN, date of birth, ID band Patient awake    Reviewed: Allergy & Precautions, NPO status , Patient's Chart, lab work & pertinent test results  Airway Mallampati: II  TM Distance: >3 FB Neck ROM: Full    Dental  (+) Edentulous Upper, Edentulous Lower   Pulmonary Patient abstained from smoking., former smoker,    breath sounds clear to auscultation       Cardiovascular hypertension,  Rhythm:Regular Rate:Normal     Neuro/Psych    GI/Hepatic   Endo/Other    Renal/GU      Musculoskeletal   Abdominal   Peds  Hematology   Anesthesia Other Findings   Reproductive/Obstetrics                            Anesthesia Physical Anesthesia Plan  ASA: III  Anesthesia Plan: General   Post-op Pain Management:    Induction: Intravenous  PONV Risk Score and Plan: Ondansetron and Dexamethasone  Airway Management Planned: Oral ETT  Additional Equipment: Arterial line  Intra-op Plan:   Post-operative Plan: Extubation in OR  Informed Consent: I have reviewed the patients History and Physical, chart, labs and discussed the procedure including the risks, benefits and alternatives for the proposed anesthesia with the patient or authorized representative who has indicated his/her understanding and acceptance.       Plan Discussed with: CRNA and Anesthesiologist  Anesthesia Plan Comments: (PAT note by Karoline Caldwell, PA-C: Follows with cardiologist Dr. Percival Spanish for history of carotid disease and mild valvular disease, mild aortic sclerosis with mild to moderate aortic insufficiency.  Last seen 05/02/2019, doing well from cardiac standpoint.  Recent diagnosis of stage IV non-small cell lung cancer with solitary brain metastasis.  Diagnosed in February 2022.  Preop labs reviewed, mild anemia with hemoglobin 10.2, otherwise unremarkable.  EKG 08/17/2020: Sinus  rhythm. Rate 62.  Consider left atrial argument.  Right bundle branch block and left anterior fascicular block.  LVH.  CT chest abdomen pelvis 08/02/2020: IMPRESSION: 1. Irregular solid 5.4 cm peripheral right upper lobe lung mass abutting and mildly distorting the minor fissure, compatible with primary bronchogenic carcinoma. Multi disciplinary thoracic oncology consultation suggested. 2. Tiny 0.2 cm lingular pulmonary nodule, for which follow-up chest CT is recommended in 3 months. 3. No additional potential findings of metastatic disease in the chest, abdomen or pelvis. No thoracic adenopathy. 4. Two-vessel coronary atherosclerosis. 5. Cholelithiasis. 6. Ectatic 2.6 cm infrarenal abdominal aorta. Recommend follow-up ultrasound every 5 years. This recommendation follows ACR consensus guidelines: White Paper of the ACR Incidental Findings Committee II on Vascular Findings. J Am Coll Radiol 2013; 10:789-794. 7. Aortic Atherosclerosis (ICD10-I70.0) and Emphysema (ICD10-J43.9).  Carotid duplex 03/16/2020: Summary:  Right Carotid: Velocities in the right ICA are consistent with a 40-59% stenosis.  Left Carotid: Velocities in the left ICA are consistent with a 60-79% stenosis.  Vertebrals: Bilateral vertebral arteries demonstrate antegrade flow.  Subclavians: Bilateral subclavian artery flow was disturbed.   *See table(s) above for measurements and observations.  Suggest follow up study in 12 months.   TTE 11/29/2013: - Left ventricle: The cavity size was normal. Wall thickness was  normal. Systolic function was normal. The estimated ejection  fraction was in the range of 55% to 60%. Wall motion was normal;  there were no regional wall motion abnormalities. Doppler  parameters are consistent with abnormal left ventricular  relaxation (grade 1 diastolic dysfunction).  - Aortic valve: Trileaflet; mildly  calcified leaflets. There was  mild to moderate regurgitation.  - Mitral  valve: Mildly calcified annulus. Mildly calcified leaflets  . There was mild regurgitation.  - Left atrium: The atrium was mildly dilated.  - Right ventricle: The cavity size was normal. Systolic function  was normal.  - Right atrium: The atrium was mildly dilated.  - Tricuspid valve: Peak RV-RA gradient (S): 25 mm Hg.  - Pulmonary arteries: PA peak pressure: 28 mm Hg (S).  - Inferior vena cava: The vessel was normal in size. The  respirophasic diameter changes were in the normal range (>= 50%),  consistent with normal central venous pressure.   Impressions:   - Normal LV size and systolic function, EF 47-12%. Normal RV size  and systolic function. Mild to moderate AI. Mild MR.  )       Anesthesia Quick Evaluation

## 2020-08-23 NOTE — Patient Instructions (Signed)
Today, you received stereotactic radiosurgery. Please avoid strenuous activity for 24 hours. It is not uncommon to have a headache following this procedure. Should you develop a headache please take whatever you normally take to manage a headache and find a cool dark room to rest in. If your headache becomes worse, you develop new/worsening neurologic symptoms or have concerns please call [phone] and request the radiation oncologist on call.

## 2020-08-24 ENCOUNTER — Inpatient Hospital Stay (HOSPITAL_COMMUNITY): Payer: Medicare HMO | Admitting: Certified Registered Nurse Anesthetist

## 2020-08-24 ENCOUNTER — Telehealth: Payer: Medicare HMO | Admitting: Internal Medicine

## 2020-08-24 ENCOUNTER — Encounter (HOSPITAL_COMMUNITY)
Admission: RE | Disposition: A | Discharge status: Home Health | Payer: Self-pay | Source: Home / Self Care | Attending: Neurological Surgery

## 2020-08-24 ENCOUNTER — Encounter (HOSPITAL_COMMUNITY): Payer: Self-pay | Admitting: Neurological Surgery

## 2020-08-24 ENCOUNTER — Inpatient Hospital Stay (HOSPITAL_COMMUNITY)
Admission: RE | Admit: 2020-08-24 | Discharge: 2020-08-25 | DRG: 026 | Disposition: A | Discharge status: Home Health | Payer: Medicare HMO | Attending: Neurological Surgery | Admitting: Neurological Surgery

## 2020-08-24 ENCOUNTER — Other Ambulatory Visit: Payer: Self-pay

## 2020-08-24 ENCOUNTER — Inpatient Hospital Stay (HOSPITAL_COMMUNITY): Payer: Medicare HMO | Admitting: Physician Assistant

## 2020-08-24 DIAGNOSIS — Z20822 Contact with and (suspected) exposure to covid-19: Secondary | ICD-10-CM | POA: Diagnosis present

## 2020-08-24 DIAGNOSIS — Z833 Family history of diabetes mellitus: Secondary | ICD-10-CM

## 2020-08-24 DIAGNOSIS — I739 Peripheral vascular disease, unspecified: Secondary | ICD-10-CM | POA: Diagnosis present

## 2020-08-24 DIAGNOSIS — Z823 Family history of stroke: Secondary | ICD-10-CM

## 2020-08-24 DIAGNOSIS — C7931 Secondary malignant neoplasm of brain: Secondary | ICD-10-CM | POA: Diagnosis not present

## 2020-08-24 DIAGNOSIS — Z8042 Family history of malignant neoplasm of prostate: Secondary | ICD-10-CM | POA: Diagnosis not present

## 2020-08-24 DIAGNOSIS — K219 Gastro-esophageal reflux disease without esophagitis: Secondary | ICD-10-CM | POA: Diagnosis not present

## 2020-08-24 DIAGNOSIS — Z803 Family history of malignant neoplasm of breast: Secondary | ICD-10-CM | POA: Diagnosis not present

## 2020-08-24 DIAGNOSIS — I10 Essential (primary) hypertension: Secondary | ICD-10-CM | POA: Diagnosis not present

## 2020-08-24 DIAGNOSIS — Z9889 Other specified postprocedural states: Secondary | ICD-10-CM

## 2020-08-24 DIAGNOSIS — D496 Neoplasm of unspecified behavior of brain: Secondary | ICD-10-CM | POA: Diagnosis present

## 2020-08-24 DIAGNOSIS — C3490 Malignant neoplasm of unspecified part of unspecified bronchus or lung: Secondary | ICD-10-CM | POA: Diagnosis not present

## 2020-08-24 DIAGNOSIS — E785 Hyperlipidemia, unspecified: Secondary | ICD-10-CM | POA: Diagnosis not present

## 2020-08-24 DIAGNOSIS — Z87891 Personal history of nicotine dependence: Secondary | ICD-10-CM | POA: Diagnosis not present

## 2020-08-24 HISTORY — PX: CRANIOTOMY: SHX93

## 2020-08-24 HISTORY — PX: APPLICATION OF CRANIAL NAVIGATION: SHX6578

## 2020-08-24 LAB — CBC
HCT: 30.8 % — ABNORMAL LOW (ref 36.0–46.0)
Hemoglobin: 9.8 g/dL — ABNORMAL LOW (ref 12.0–15.0)
MCH: 27 pg (ref 26.0–34.0)
MCHC: 31.8 g/dL (ref 30.0–36.0)
MCV: 84.8 fL (ref 80.0–100.0)
Platelets: 193 10*3/uL (ref 150–400)
RBC: 3.63 MIL/uL — ABNORMAL LOW (ref 3.87–5.11)
RDW: 13.2 % (ref 11.5–15.5)
WBC: 6.1 10*3/uL (ref 4.0–10.5)
nRBC: 0 % (ref 0.0–0.2)

## 2020-08-24 LAB — GLUCOSE, CAPILLARY: Glucose-Capillary: 97 mg/dL (ref 70–99)

## 2020-08-24 LAB — CREATININE, SERUM
Creatinine, Ser: 0.76 mg/dL (ref 0.44–1.00)
GFR, Estimated: >60 mL/min (ref >60)

## 2020-08-24 SURGERY — CRANIOTOMY TUMOR EXCISION
Anesthesia: General | Site: Head

## 2020-08-24 MED ORDER — FENTANYL CITRATE (PF) 100 MCG/2ML IJ SOLN
INTRAMUSCULAR | Status: DC | PRN
  Administered 2020-08-24: 100 ug via INTRAVENOUS
  Administered 2020-08-24: 50 ug via INTRAVENOUS
  Administered 2020-08-24: 100 ug via INTRAVENOUS

## 2020-08-24 MED ORDER — CHLORHEXIDINE GLUCONATE CLOTH 2 % EX PADS: 6.0000 | MEDICATED_PAD | Freq: Once | CUTANEOUS | Status: DC

## 2020-08-24 MED ORDER — ONDANSETRON HCL 4 MG/2ML IJ SOLN
INTRAMUSCULAR | Status: AC
  Filled 2020-08-24: qty 2

## 2020-08-24 MED ORDER — DEXAMETHASONE 4 MG PO TABS
4.0000 mg | ORAL_TABLET | Freq: Every day | ORAL | Status: DC
  Administered 2020-08-25: 4 mg via ORAL
  Filled 2020-08-24 (×2): qty 1

## 2020-08-24 MED ORDER — THROMBIN 5000 UNITS EX SOLR: OROMUCOSAL | Status: DC | PRN

## 2020-08-24 MED ORDER — BACITRACIN ZINC 500 UNIT/GM EX OINT
TOPICAL_OINTMENT | CUTANEOUS | Status: DC | PRN
  Administered 2020-08-24: 1 via TOPICAL

## 2020-08-24 MED ORDER — OXYCODONE HCL 5 MG/5ML PO SOLN: 5.0000 mg | Freq: Once | ORAL | Status: DC | PRN

## 2020-08-24 MED ORDER — LIDOCAINE-EPINEPHRINE 1 %-1:100000 IJ SOLN
INTRAMUSCULAR | Status: DC | PRN
  Administered 2020-08-24: 6 mL

## 2020-08-24 MED ORDER — FENOFIBRATE 160 MG PO TABS
160.0000 mg | ORAL_TABLET | Freq: Every day | ORAL | Status: DC
  Administered 2020-08-25: 160 mg via ORAL
  Filled 2020-08-24 (×2): qty 1

## 2020-08-24 MED ORDER — PHENYLEPHRINE 40 MCG/ML (10ML) SYRINGE FOR IV PUSH (FOR BLOOD PRESSURE SUPPORT)
PREFILLED_SYRINGE | INTRAVENOUS | Status: DC | PRN
  Administered 2020-08-24: 160 ug via INTRAVENOUS

## 2020-08-24 MED ORDER — ACETAMINOPHEN 325 MG PO TABS: 650.0000 mg | ORAL_TABLET | ORAL | Status: DC | PRN

## 2020-08-24 MED ORDER — HEMOSTATIC AGENTS (NO CHARGE) OPTIME
TOPICAL | Status: DC | PRN
Start: 2020-08-24 — End: 2020-08-24
  Administered 2020-08-24: 1 via TOPICAL

## 2020-08-24 MED ORDER — ORAL CARE MOUTH RINSE: 15.0000 mL | Freq: Once | OROMUCOSAL | Status: AC

## 2020-08-24 MED ORDER — CEFAZOLIN SODIUM-DEXTROSE 2-4 GM/100ML-% IV SOLN
2.0000 g | Freq: Three times a day (TID) | INTRAVENOUS | Status: AC
Start: 2020-08-24 — End: 2020-08-25
  Administered 2020-08-24 – 2020-08-25 (×2): 2 g via INTRAVENOUS
  Filled 2020-08-24 (×2): qty 100

## 2020-08-24 MED ORDER — LIDOCAINE 2% (20 MG/ML) 5 ML SYRINGE
INTRAMUSCULAR | Status: AC
  Filled 2020-08-24: qty 5

## 2020-08-24 MED ORDER — THROMBIN 20000 UNITS EX SOLR
CUTANEOUS | Status: AC
  Filled 2020-08-24: qty 20000

## 2020-08-24 MED ORDER — HYDRALAZINE HCL 20 MG/ML IJ SOLN
INTRAMUSCULAR | Status: AC
  Filled 2020-08-24: qty 1

## 2020-08-24 MED ORDER — THROMBIN 5000 UNITS EX SOLR
CUTANEOUS | Status: AC
  Filled 2020-08-24: qty 5000

## 2020-08-24 MED ORDER — THROMBIN 20000 UNITS EX SOLR: CUTANEOUS | Status: DC | PRN

## 2020-08-24 MED ORDER — POLYETHYLENE GLYCOL 3350 17 G PO PACK
17.0000 g | PACK | Freq: Two times a day (BID) | ORAL | Status: DC
  Administered 2020-08-24 – 2020-08-25 (×2): 17 g via ORAL
  Filled 2020-08-24 (×2): qty 1

## 2020-08-24 MED ORDER — ESMOLOL HCL 100 MG/10ML IV SOLN
INTRAVENOUS | Status: DC | PRN
  Administered 2020-08-24: 20 mg via INTRAVENOUS
  Administered 2020-08-24: 10 mg via INTRAVENOUS
  Administered 2020-08-24: 30 mg via INTRAVENOUS
  Administered 2020-08-24: 10 mg via INTRAVENOUS

## 2020-08-24 MED ORDER — PROPOFOL 10 MG/ML IV BOLUS
INTRAVENOUS | Status: DC | PRN
  Administered 2020-08-24: 20 mg via INTRAVENOUS
  Administered 2020-08-24: 30 mg via INTRAVENOUS
  Administered 2020-08-24: 150 mg via INTRAVENOUS

## 2020-08-24 MED ORDER — PANTOPRAZOLE SODIUM 40 MG PO TBEC
40.0000 mg | DELAYED_RELEASE_TABLET | Freq: Every day | ORAL | Status: DC
  Administered 2020-08-25: 40 mg via ORAL
  Filled 2020-08-24 (×2): qty 1

## 2020-08-24 MED ORDER — ROCURONIUM BROMIDE 10 MG/ML (PF) SYRINGE
PREFILLED_SYRINGE | INTRAVENOUS | Status: DC | PRN
  Administered 2020-08-24: 50 mg via INTRAVENOUS
  Administered 2020-08-24: 10 mg via INTRAVENOUS

## 2020-08-24 MED ORDER — FENTANYL CITRATE (PF) 250 MCG/5ML IJ SOLN
INTRAMUSCULAR | Status: AC
  Filled 2020-08-24: qty 5

## 2020-08-24 MED ORDER — PROMETHAZINE HCL 25 MG PO TABS: 12.5000 mg | ORAL_TABLET | ORAL | Status: DC | PRN

## 2020-08-24 MED ORDER — SUGAMMADEX SODIUM 200 MG/2ML IV SOLN
INTRAVENOUS | Status: DC | PRN
  Administered 2020-08-24: 200 mg via INTRAVENOUS

## 2020-08-24 MED ORDER — FENTANYL CITRATE (PF) 100 MCG/2ML IJ SOLN
INTRAMUSCULAR | Status: AC
  Filled 2020-08-24: qty 2

## 2020-08-24 MED ORDER — LIDOCAINE-EPINEPHRINE 1 %-1:100000 IJ SOLN
INTRAMUSCULAR | Status: AC
  Filled 2020-08-24: qty 1

## 2020-08-24 MED ORDER — HYDROMORPHONE HCL 1 MG/ML IJ SOLN: 0.5000 mg | INTRAMUSCULAR | Status: DC | PRN

## 2020-08-24 MED ORDER — FENTANYL CITRATE (PF) 100 MCG/2ML IJ SOLN
25.0000 ug | INTRAMUSCULAR | Status: DC | PRN
  Administered 2020-08-24: 25 ug via INTRAVENOUS

## 2020-08-24 MED ORDER — ONDANSETRON HCL 4 MG PO TABS
4.0000 mg | ORAL_TABLET | ORAL | Status: DC | PRN
  Administered 2020-08-25: 4 mg via ORAL
  Filled 2020-08-24: qty 1

## 2020-08-24 MED ORDER — HYPROMELLOSE (GONIOSCOPIC) 2.5 % OP SOLN
1.0000 [drp] | OPHTHALMIC | Status: DC | PRN
Start: 2020-08-24 — End: 2020-08-25
  Filled 2020-08-24: qty 15

## 2020-08-24 MED ORDER — HYDROCODONE-ACETAMINOPHEN 5-325 MG PO TABS
1.0000 | ORAL_TABLET | ORAL | Status: DC | PRN
Start: 2020-08-24 — End: 2020-08-25
  Administered 2020-08-24: 1 via ORAL
  Filled 2020-08-24: qty 1

## 2020-08-24 MED ORDER — DEXAMETHASONE SODIUM PHOSPHATE 10 MG/ML IJ SOLN
INTRAMUSCULAR | Status: AC
  Filled 2020-08-24: qty 1

## 2020-08-24 MED ORDER — PROPOFOL 10 MG/ML IV BOLUS
INTRAVENOUS | Status: AC
  Filled 2020-08-24: qty 20

## 2020-08-24 MED ORDER — GLYCOPYRROLATE PF 0.2 MG/ML IJ SOSY
PREFILLED_SYRINGE | INTRAMUSCULAR | Status: DC | PRN
  Administered 2020-08-24: .2 mg via INTRAVENOUS

## 2020-08-24 MED ORDER — SODIUM CHLORIDE 0.9 % IV SOLN: INTRAVENOUS | Status: DC

## 2020-08-24 MED ORDER — ONDANSETRON HCL 4 MG/2ML IJ SOLN: 4.0000 mg | Freq: Once | INTRAMUSCULAR | Status: DC | PRN

## 2020-08-24 MED ORDER — LABETALOL HCL 5 MG/ML IV SOLN: 10.0000 mg | INTRAVENOUS | Status: DC | PRN

## 2020-08-24 MED ORDER — 0.9 % SODIUM CHLORIDE (POUR BTL) OPTIME
TOPICAL | Status: DC | PRN
  Administered 2020-08-24: 3000 mL

## 2020-08-24 MED ORDER — PRAVASTATIN SODIUM 40 MG PO TABS
40.0000 mg | ORAL_TABLET | Freq: Every evening | ORAL | Status: DC
  Filled 2020-08-24: qty 1

## 2020-08-24 MED ORDER — PHENYLEPHRINE 40 MCG/ML (10ML) SYRINGE FOR IV PUSH (FOR BLOOD PRESSURE SUPPORT)
PREFILLED_SYRINGE | INTRAVENOUS | Status: AC
  Filled 2020-08-24: qty 10

## 2020-08-24 MED ORDER — CEFAZOLIN SODIUM-DEXTROSE 2-4 GM/100ML-% IV SOLN
2.0000 g | INTRAVENOUS | Status: AC
  Administered 2020-08-24: 2 g via INTRAVENOUS
  Filled 2020-08-24: qty 100

## 2020-08-24 MED ORDER — LACTATED RINGERS IV SOLN: INTRAVENOUS | Status: DC

## 2020-08-24 MED ORDER — CARBOXYMETHYLCELLULOSE SODIUM 0.5 % OP SOLN: 1.0000 [drp] | Freq: Every day | OPHTHALMIC | Status: DC

## 2020-08-24 MED ORDER — HEPARIN SODIUM (PORCINE) 5000 UNIT/ML IJ SOLN: 5000.0000 [IU] | Freq: Three times a day (TID) | INTRAMUSCULAR | Status: DC

## 2020-08-24 MED ORDER — ONDANSETRON HCL 4 MG/2ML IJ SOLN
INTRAMUSCULAR | Status: DC | PRN
  Administered 2020-08-24: 4 mg via INTRAVENOUS

## 2020-08-24 MED ORDER — GLYCOPYRROLATE PF 0.2 MG/ML IJ SOSY
PREFILLED_SYRINGE | INTRAMUSCULAR | Status: AC
  Filled 2020-08-24: qty 1

## 2020-08-24 MED ORDER — ALPRAZOLAM 0.25 MG PO TABS
0.2500 mg | ORAL_TABLET | Freq: Two times a day (BID) | ORAL | Status: DC | PRN
  Administered 2020-08-24: 0.25 mg via ORAL
  Filled 2020-08-24: qty 1

## 2020-08-24 MED ORDER — ONDANSETRON HCL 4 MG/2ML IJ SOLN: 4.0000 mg | INTRAMUSCULAR | Status: DC | PRN

## 2020-08-24 MED ORDER — ACETAMINOPHEN 650 MG RE SUPP: 650.0000 mg | RECTAL | Status: DC | PRN

## 2020-08-24 MED ORDER — PHENYLEPHRINE HCL-NACL 10-0.9 MG/250ML-% IV SOLN
INTRAVENOUS | Status: DC | PRN
  Administered 2020-08-24: 40 ug/min via INTRAVENOUS

## 2020-08-24 MED ORDER — CHLORHEXIDINE GLUCONATE 0.12 % MT SOLN
15.0000 mL | Freq: Once | OROMUCOSAL | Status: AC
  Administered 2020-08-24: 15 mL via OROMUCOSAL
  Filled 2020-08-24: qty 15

## 2020-08-24 MED ORDER — LIDOCAINE 2% (20 MG/ML) 5 ML SYRINGE
INTRAMUSCULAR | Status: DC | PRN
  Administered 2020-08-24: 40 mg via INTRAVENOUS

## 2020-08-24 MED ORDER — HYDRALAZINE HCL 20 MG/ML IJ SOLN
10.0000 mg | Freq: Once | INTRAMUSCULAR | Status: AC
  Administered 2020-08-24: 10 mg via INTRAVENOUS

## 2020-08-24 MED ORDER — DOCUSATE SODIUM 100 MG PO CAPS
100.0000 mg | ORAL_CAPSULE | Freq: Two times a day (BID) | ORAL | Status: DC
  Administered 2020-08-24 – 2020-08-25 (×2): 100 mg via ORAL
  Filled 2020-08-24 (×2): qty 1

## 2020-08-24 MED ORDER — SODIUM CHLORIDE 0.9 % IV SOLN: INTRAVENOUS | Status: DC | PRN

## 2020-08-24 MED ORDER — MIRTAZAPINE 15 MG PO TABS
7.5000 mg | ORAL_TABLET | Freq: Every day | ORAL | Status: DC
Start: 2020-08-24 — End: 2020-08-25
  Administered 2020-08-24: 7.5 mg via ORAL
  Filled 2020-08-24: qty 1

## 2020-08-24 MED ORDER — SENNOSIDES-DOCUSATE SODIUM 8.6-50 MG PO TABS
1.0000 | ORAL_TABLET | Freq: Two times a day (BID) | ORAL | Status: DC
  Administered 2020-08-24 – 2020-08-25 (×2): 1 via ORAL
  Filled 2020-08-24 (×2): qty 1

## 2020-08-24 MED ORDER — OXYCODONE HCL 5 MG PO TABS: 5.0000 mg | ORAL_TABLET | Freq: Once | ORAL | Status: DC | PRN

## 2020-08-24 MED ORDER — HYDROCHLOROTHIAZIDE 25 MG PO TABS
12.5000 mg | ORAL_TABLET | Freq: Every day | ORAL | Status: DC
  Administered 2020-08-25: 12.5 mg via ORAL
  Filled 2020-08-24 (×2): qty 1

## 2020-08-24 MED ORDER — ROCURONIUM BROMIDE 10 MG/ML (PF) SYRINGE
PREFILLED_SYRINGE | INTRAVENOUS | Status: AC
  Filled 2020-08-24: qty 10

## 2020-08-24 SURGICAL SUPPLY — 74 items
BAND RUBBER #18 3X1/16 STRL (MISCELLANEOUS) IMPLANT
BLADE CLIPPER SURG (BLADE) ×3 IMPLANT
BUR ACORN 9.0 PRECISION (BURR) ×3 IMPLANT
BUR SPIRAL ROUTER 2.3 (BUR) ×3 IMPLANT
CANISTER SUCT 3000ML PPV (MISCELLANEOUS) ×6 IMPLANT
CLIP VESOCCLUDE MED 6/CT (CLIP) IMPLANT
CNTNR URN SCR LID CUP LEK RST (MISCELLANEOUS) ×4 IMPLANT
CONT SPEC 4OZ STRL OR WHT (MISCELLANEOUS) ×6
COVER BURR HOLE 7 (Orthopedic Implant) ×3 IMPLANT
DECANTER SPIKE VIAL GLASS SM (MISCELLANEOUS) ×3 IMPLANT
DRAPE HALF SHEET 40X57 (DRAPES) ×3 IMPLANT
DRAPE MICROSCOPE LEICA (MISCELLANEOUS) IMPLANT
DRAPE NEUROLOGICAL W/INCISE (DRAPES) ×3 IMPLANT
DRAPE WARM FLUID 44X44 (DRAPES) ×3 IMPLANT
DRSG ADAPTIC 3X8 NADH LF (GAUZE/BANDAGES/DRESSINGS) IMPLANT
DRSG TELFA 3X8 NADH (GAUZE/BANDAGES/DRESSINGS) IMPLANT
DURAPREP 6ML APPLICATOR 50/CS (WOUND CARE) ×3 IMPLANT
ELECT REM PT RETURN 9FT ADLT (ELECTROSURGICAL) ×3
ELECTRODE REM PT RTRN 9FT ADLT (ELECTROSURGICAL) ×2 IMPLANT
EVACUATOR 1/8 PVC DRAIN (DRAIN) IMPLANT
EVACUATOR SILICONE 100CC (DRAIN) IMPLANT
FORCEPS BIPOLAR SPETZLER 8 1.0 (NEUROSURGERY SUPPLIES) ×3 IMPLANT
GAUZE 4X4 16PLY RFD (DISPOSABLE) IMPLANT
GAUZE SPONGE 4X4 12PLY STRL (GAUZE/BANDAGES/DRESSINGS) IMPLANT
GLOVE BIO SURGEON STRL SZ7 (GLOVE) IMPLANT
GLOVE BIOGEL PI IND STRL 7.5 (GLOVE) ×2 IMPLANT
GLOVE BIOGEL PI INDICATOR 7.5 (GLOVE) ×1
GLOVE ECLIPSE 7.5 STRL STRAW (GLOVE) ×3 IMPLANT
GLOVE EXAM NITRILE LRG STRL (GLOVE) IMPLANT
GLOVE EXAM NITRILE XS STR PU (GLOVE) IMPLANT
GLOVE SURG UNDER POLY LF SZ7 (GLOVE) IMPLANT
GOWN STRL REUS W/ TWL LRG LVL3 (GOWN DISPOSABLE) ×4 IMPLANT
GOWN STRL REUS W/ TWL XL LVL3 (GOWN DISPOSABLE) IMPLANT
GOWN STRL REUS W/TWL 2XL LVL3 (GOWN DISPOSABLE) IMPLANT
GOWN STRL REUS W/TWL LRG LVL3 (GOWN DISPOSABLE) ×6
GOWN STRL REUS W/TWL XL LVL3 (GOWN DISPOSABLE)
HEMOSTAT POWDER KIT SURGIFOAM (HEMOSTASIS) ×3 IMPLANT
HEMOSTAT SURGICEL 2X14 (HEMOSTASIS) ×3 IMPLANT
IV NS 1000ML (IV SOLUTION) ×3
IV NS 1000ML BAXH (IV SOLUTION) ×2 IMPLANT
KIT BASIN OR (CUSTOM PROCEDURE TRAY) ×3 IMPLANT
KIT DRAIN CSF ACCUDRAIN (MISCELLANEOUS) IMPLANT
KIT TURNOVER KIT B (KITS) ×3 IMPLANT
MARKER SPHERE PSV REFLC 13MM (MARKER) ×9 IMPLANT
NEEDLE HYPO 22GX1.5 SAFETY (NEEDLE) ×3 IMPLANT
NEEDLE SPNL 18GX3.5 QUINCKE PK (NEEDLE) IMPLANT
NS IRRIG 1000ML POUR BTL (IV SOLUTION) ×9 IMPLANT
PACK CRANIOTOMY CUSTOM (CUSTOM PROCEDURE TRAY) ×3 IMPLANT
PATTIES SURGICAL .25X.25 (GAUZE/BANDAGES/DRESSINGS) IMPLANT
PATTIES SURGICAL .5 X.5 (GAUZE/BANDAGES/DRESSINGS) IMPLANT
PATTIES SURGICAL .5 X3 (DISPOSABLE) IMPLANT
PATTIES SURGICAL 1/4 X 3 (GAUZE/BANDAGES/DRESSINGS) IMPLANT
PATTIES SURGICAL 1X1 (DISPOSABLE) IMPLANT
PIN MAYFIELD SKULL DISP (PIN) ×3 IMPLANT
PLATE DOUBLE Y CMF 6H (Plate) ×6 IMPLANT
SCREW UNIII AXS SD 1.5X4 (Screw) ×33 IMPLANT
SPECIMEN JAR SMALL (MISCELLANEOUS) IMPLANT
SPONGE NEURO XRAY DETECT 1X3 (DISPOSABLE) IMPLANT
SPONGE SURGIFOAM ABS GEL 100 (HEMOSTASIS) ×3 IMPLANT
STAPLER VISISTAT 35W (STAPLE) ×3 IMPLANT
SUT ETHILON 3 0 FSL (SUTURE) IMPLANT
SUT ETHILON 3 0 PS 1 (SUTURE) IMPLANT
SUT MNCRL AB 3-0 PS2 18 (SUTURE) IMPLANT
SUT MON AB 3-0 SH 27 (SUTURE)
SUT MON AB 3-0 SH27 (SUTURE) IMPLANT
SUT NURALON 4 0 TR CR/8 (SUTURE) ×9 IMPLANT
SUT SILK 0 TIES 10X30 (SUTURE) IMPLANT
SUT VIC AB 2-0 CP2 18 (SUTURE) ×3 IMPLANT
TOWEL GREEN STERILE (TOWEL DISPOSABLE) ×3 IMPLANT
TOWEL GREEN STERILE FF (TOWEL DISPOSABLE) ×3 IMPLANT
TRAY FOLEY MTR SLVR 16FR STAT (SET/KITS/TRAYS/PACK) ×3 IMPLANT
TUBE CONNECTING 12X1/4 (SUCTIONS) ×3 IMPLANT
UNDERPAD 30X36 HEAVY ABSORB (UNDERPADS AND DIAPERS) ×3 IMPLANT
WATER STERILE IRR 1000ML POUR (IV SOLUTION) ×3 IMPLANT

## 2020-08-24 NOTE — Progress Notes (Signed)
Neurosurgery Service Post-operative progress note  Assessment & Plan: 70 y.o. woman s/p L F crani for tumor resection, seen in PACU, MAEx4 with normal speech.  -transfer to 4NP -MRI w/wo to confirm GTR -advance activity and diet as tolerated -PT/OT in AM, can go home tomorrow if doing well  Judith Part  08/24/20 3:00 PM

## 2020-08-24 NOTE — Transfer of Care (Signed)
Immediate Anesthesia Transfer of Care Note  Patient: Hannah [name]  Procedure(s) Performed: Left Craniotomy for Tumor Resection with Brainlab (Left Head) APPLICATION OF CRANIAL NAVIGATION (N/A )  Patient Location: PACU  Anesthesia Type:General  Level of Consciousness: drowsy and patient cooperative  Airway & Oxygen Therapy: Patient Spontanous Breathing  Post-op Assessment: Report given to RN and Patient moving all extremities X 4  Post vital signs: Reviewed and stable  Last Vitals:  Vitals Value Taken Time  BP 162/75 08/24/20 1446  Temp    Pulse 70 08/24/20 1446  Resp 13 08/24/20 1446  SpO2 100 % 08/24/20 1446  Vitals shown include unvalidated device data.  Last Pain:  Vitals:   08/24/20 1032  TempSrc:   PainSc: 0-No pain         Complications: No complications documented.

## 2020-08-24 NOTE — Brief Op Note (Signed)
08/24/2020  2:40 PM  PATIENT:  Hannah [name]  70 y.o. female  PRE-OPERATIVE DIAGNOSIS:  Brain tumor  POST-OPERATIVE DIAGNOSIS:  Brain tumor  PROCEDURE:  Procedure(s): Left Craniotomy for Tumor Resection with Brainlab (Left) APPLICATION OF CRANIAL NAVIGATION (N/A)  SURGEON:  Surgeon(s) and Role:    * Judith Part, MD - Primary  PHYSICIAN ASSISTANT:   ASSISTANTS: none   ANESTHESIA:   general  EBL:  75 mL   BLOOD ADMINISTERED:none  DRAINS: none   LOCAL MEDICATIONS USED:  LIDOCAINE   SPECIMEN:  Source of Specimen:  Left frontal brain tumor  DISPOSITION OF SPECIMEN:  PATHOLOGY  COUNTS:  YES  TOURNIQUET:  * No tourniquets in log *  DICTATION: .Note written in EPIC  PLAN OF CARE: Admit to inpatient   PATIENT DISPOSITION:  PACU - hemodynamically stable.   Delay start of Pharmacological VTE agent (>24hrs) due to surgical blood loss or risk of bleeding: yes

## 2020-08-24 NOTE — Anesthesia Procedure Notes (Signed)
Arterial Line Insertion Start/End2/25/2022 12:27 PM, 08/24/2020 12:37 PM Performed by: Reece Agar, CRNA, CRNA  Patient location: Pre-op. Preanesthetic checklist: patient identified, IV checked, site marked, risks and benefits discussed, surgical consent, monitors and equipment checked, pre-op evaluation, timeout performed and anesthesia consent Lidocaine 1% used for infiltration Right, radial was placed Catheter size: 20 G Hand hygiene performed , maximum sterile barriers used  and Seldinger technique used Allen's test indicative of satisfactory collateral circulation Attempts: 1 Procedure performed without using ultrasound guided technique. Following insertion, dressing applied and Biopatch. Post procedure assessment: normal and unchanged

## 2020-08-24 NOTE — Anesthesia Procedure Notes (Signed)
Procedure Name: Intubation Date/Time: 08/24/2020 1:08 PM Performed by: Barrington Ellison, CRNA Pre-anesthesia Checklist: Patient identified, Emergency Drugs available, Suction available and Patient being monitored Patient Re-evaluated:Patient Re-evaluated prior to induction Oxygen Delivery Method: Circle System Utilized Preoxygenation: Pre-oxygenation with 100% oxygen Induction Type: IV induction Ventilation: Mask ventilation without difficulty Laryngoscope Size: Mac and 3 Grade View: Grade I Tube type: Oral Tube size: 7.0 mm Number of attempts: 1 Airway Equipment and Method: Stylet and Oral airway Placement Confirmation: ETT inserted through vocal cords under direct vision,  positive ETCO2 and breath sounds checked- equal and bilateral Secured at: 21 cm Tube secured with: Tape Dental Injury: Teeth and Oropharynx as per pre-operative assessment

## 2020-08-24 NOTE — Progress Notes (Signed)
  Radiation Oncology         [phone] ________________________________  Stereotactic Treatment Procedure Note  Name: RENUKA FARFAN MRN: [id number]  Date: 08/23/2020  DOB: 02-01-1951  SPECIAL TREATMENT PROCEDURE    ICD-10-CM   1. Brain metastasis ([place])  C79.31   2. Primary cancer of right upper lobe of lung ([place])  C34.11     3D TREATMENT PLANNING AND DOSIMETRY:  The patient's radiation plan was reviewed and approved by neurosurgery and radiation oncology prior to treatment.  It showed 3-dimensional radiation distributions overlaid onto the planning CT/MRI image set.  The [facility] for the target structures as well as the organs at risk were reviewed. The documentation of the 3D plan and dosimetry are filed in the radiation oncology EMR.  NARRATIVE:  KAYLEN MOTL was brought to the TrueBeam stereotactic radiation treatment machine and placed supine on the CT couch. The head frame was applied, and the patient was set up for stereotactic radiosurgery.  Neurosurgery was present for the set-up and delivery  SIMULATION VERIFICATION:  In the couch zero-angle position, the patient underwent Exactrac imaging using the Brainlab system with orthogonal KV images.  These were carefully aligned and repeated to confirm treatment position for each of the isocenters.  The Exactrac snap film verification was repeated at each couch angle.  PROCEDURE: [name] received stereotactic radiosurgery to the following targets: Left frontal 2.5 cm target was treated using 4 Rapid Arc VMAT Beams to a prescription dose of 18 Gy.  ExacTrac registration was performed for each couch angle.  The 100% isodose line was prescribed.  6 MV X-rays were delivered in the flattening filter free beam mode.beam mode.  STEREOTACTIC TREATMENT MANAGEMENT:  Following delivery, the patient was transported to nursing in stable condition and monitored for possible acute effects.  Vital signs were recorded BP 126/67 (BP Location: Right  Arm, Patient Position: Sitting, Cuff Size: Normal)   Pulse 61   Temp 97.9 F (36.6 C)   Resp 18   SpO2 100% . The patient tolerated treatment without significant acute effects, and was discharged to home in stable condition.    PLAN: Proceed with craniotomy for image guided resection tomorrow and then Follow-up in one month.  Also proceed with cardiothoracic surgery assessment for possible definitive resection of the primary tumor.  ________________________________  Sheral Apley. Tammi Klippel, M.D.

## 2020-08-24 NOTE — H&P (Signed)
Surgical H&P Update  HPI: 70 y.o. woman with lung adenocarcinoma with a solitary brain metastasis. She had progressive difficulty with speech and right sided weakness, improved with steroids, diagnosed with left frontal metastasis, she had preop SRS yesterday, presents today for resection. No changes in health since she was last seen, no new complaints.  PMHx:  Past Medical History:  Diagnosis Date  . Allergy   . Anemia   . Anxiety   . Arthritis   . Carpal tunnel syndrome   . Constipation    senna C stool softeners help   . Depression   . Diverticulosis   . Dyslipidemia   . External hemorrhoids   . GERD (gastroesophageal reflux disease)   . Heart murmur    mild-moderate AR  . Hiatal hernia   . Hyperlipidemia    on meds   . Hypertension   . Internal hemorrhoids   . Osteoarthritis   . Pre-diabetes   . PVD (peripheral vascular disease) (HCC)    moderate carotid disease  . RBBB   . Smoker   . Vocal cord polyps    FamHx:  Family History  Problem Relation Age of Onset  . Cancer Father   . Prostate cancer Father   . Diabetes Sister   . Cancer Sister   . Stroke Maternal Grandfather   . Colitis Maternal Aunt   . Breast cancer Maternal Aunt   . Colon cancer Neg Hx   . Colon polyps Neg Hx   . Rectal cancer Neg Hx   . Stomach cancer Neg Hx   . Esophageal cancer Neg Hx    SocHx:  reports that she quit smoking about 6 weeks ago. Her smoking use included cigarettes. She has a 10.00 pack-year smoking history. She has never used smokeless tobacco. She reports that she does not drink alcohol and does not use drugs.  Physical Exam: AOx3, PERRL, FS, TM  Strength 5/5 x4, SILTx4  Assesment/Plan: 70 y.o. woman with solitary brain met 2/2 NSCLC, here for resection. Risks, benefits, and alternatives discussed and the patient would like to continue with surgery.  -OR today -4NP post-op  Judith Part, MD 08/24/20 12:42 PM

## 2020-08-24 NOTE — Anesthesia Postprocedure Evaluation (Signed)
Anesthesia Post Note  Patient: Hannah [name]  Procedure(s) Performed: Left Craniotomy for Tumor Resection with Brainlab (Left Head) APPLICATION OF CRANIAL NAVIGATION (N/A )     Patient location during evaluation: PACU Anesthesia Type: General Level of consciousness: awake and alert Pain management: pain level controlled Vital Signs Assessment: post-procedure vital signs reviewed and stable Respiratory status: spontaneous breathing, nonlabored ventilation, respiratory function stable and patient connected to nasal cannula oxygen Cardiovascular status: blood pressure returned to baseline and stable Postop Assessment: no apparent nausea or vomiting Anesthetic complications: no   No complications documented.  Last Vitals:  Vitals:   08/24/20 1545 08/24/20 1610  BP: 124/63 131/69  Pulse: 77 66  Resp: 20 14  Temp: 36.6 C (!) 36.3 C  SpO2: 100% 100%    Last Pain:  Vitals:   08/24/20 1610  TempSrc: Oral  PainSc: 0-No pain                 JOSLIN,DAVID COKER

## 2020-08-24 NOTE — Op Note (Signed)
PATIENT: Hannah [name]  DAY OF SURGERY: 08/24/20   PRE-OPERATIVE DIAGNOSIS:  Left frontal brain tumor   POST-OPERATIVE DIAGNOSIS:  Same   PROCEDURE:  Left frontal craniotomy for tumor resection   SURGEON:  Surgeon(s) and Role:    Judith Part, MD - Primary   ANESTHESIA: ETGA   BRIEF HISTORY: This is a 70 year old woman who presented with right sided weakness and speech difficulty. The patient was found to have NSCLC with a solitary met and an impressive amount of edema. Her symptoms improved with steroids and it was recommended to have preop SRS and resection. This was discussed with the patient as well as risks, benefits, and alternatives and wished to proceed with surgery.   OPERATIVE DETAIL: The patient was taken to the operating room and placed on the OR table in the supine position. A formal time out was performed with two patient identifiers and confirmed the operative site. Anesthesia was induced by the anesthesia team. The [place] head holder was applied to the head and a registration array was attached to the [place]. This was co-registered with the patient's preoperative imaging, the fit appeared to be acceptable. Using frameless stereotaxy, the operative trajectory was planned and the incision was marked. Hair was clipped with surgical clippers over the incision and the area was then prepped and draped in a sterile fashion.  A linear incision was placed in the left frontal region. Soft tissues were dissected and retracted, a craniotomy flap was turned with a high speed drill, the dura was opened and flapped medially and the tumor was located using visual landmarks as well as confirmation with frameless stereotaxy. It was firm and clearly abnormal and easy to distinguish from the adjacent tissue. It was dissected circumferentially and resected en bloc, then sent for pathology. Hemostasis was confirmed, the wound was copiously irrigated, the dura was closed with suture, and the  bone flap was replaced with titanium plates and screws.   All instrument and sponge counts were correct, the incision was then closed in layers. The patient was then returned to anesthesia for emergence. No apparent complications at the completion of the procedure.   EBL:  54mL   DRAINS: none   SPECIMENS: Left frontal brain tumor   Judith Part, MD 08/24/20 2:37 PM

## 2020-08-25 ENCOUNTER — Inpatient Hospital Stay (HOSPITAL_COMMUNITY): Payer: Medicare HMO

## 2020-08-25 MED ORDER — GADOBUTROL 1 MMOL/ML IV SOLN
4.5000 mL | Freq: Once | INTRAVENOUS | Status: AC | PRN
  Administered 2020-08-25: 4.5 mL via INTRAVENOUS

## 2020-08-25 MED ORDER — ASPIRIN 81 MG PO TABS: 81.0000 mg | ORAL_TABLET | Freq: Every day | ORAL | Status: AC

## 2020-08-25 MED ORDER — HYDROCODONE-ACETAMINOPHEN 5-325 MG PO TABS: 1.0000 | ORAL_TABLET | ORAL | 0 refills | Status: DC | PRN

## 2020-08-25 NOTE — Discharge Instructions (Signed)
Discharge Instructions  No restriction in activities, slowly increase your activity back to normal.   Your incision is closed with absorbable sutures. These will naturally fall off over the next 4-6 weeks. If they become bothersome or cause discomfort, apply some antibiotic ointment like bacitracin or neosporin on the sutures. This will soften them up and usually makes them more comfortable while they dissolve.  Okay to shower on the day of discharge. Be gentle when cleaning your incision. Use regular soap and water. If that is uncomfortable, try using baby shampoo. Do not submerge the wound under water for 2 weeks after surgery.  Follow up with Dr. Zada Finders in 2 weeks after discharge. If you do not already have a discharge appointment, please call his office at [phone] to schedule a follow up appointment. If you have any concerns or questions, please call the office and let us know.

## 2020-08-25 NOTE — Discharge Summary (Signed)
Discharge Summary  Date of Admission: 08/24/2020  Date of Discharge: 08/25/20  Attending Physician: Emelda Brothers, MD  Hospital Course: Patient was admitted following an uncomplicated left frontal craniotomy for tumor resection of a suspected NSCLC met. She was recovered in PACU and transferred to 4NP. A post-op MRI showed GTR of the lesion. Her hospital course was uncomplicated and the patient was discharged home on 08/25/20. She will follow up in clinic with me in 2 weeks.  Neurologic exam at discharge:  AOx3, PERRL, EOMI, FS, TM Strength 5/5 x4, SILTx4  Discharge diagnosis: Brain tumor  Judith Part, MD 08/25/20 8:30 AM

## 2020-08-25 NOTE — Progress Notes (Addendum)
0900 Patient is doing well, she was able to move from her bed to chair and chair to bathroom minimal assistance. Patients catheter has been removed. Family is in the room with patient waiting on her MRI results.   1215 Patient daughter describes Patient a little groggy, anxious (possible due to readiness for discharge & slight hand tremor in her right arm). I educated her on her discharge and gave her a boost resource brew beverage.  Shelbie Proctor, RN

## 2020-08-25 NOTE — Evaluation (Signed)
Physical Therapy Evaluation Patient Details Name: Hannah [name] MRN: [id number] DOB: 10-30-1950 Today's Date: 08/25/2020   History of Present Illness  The pt is a 70 yo female presenting s/p L frontal craniotomy for tumor resection on 2/25. PMH includes lung adenocarcinoma with solitary brain metastasis resulting in progressive speech difficulties and R-sided weakness. Other PMH includes: anxiety, depression, GERD, HLD, HTN, and PVD.    Clinical Impression  Pt in bed upon arrival of PT, agreeable to evaluation at this time. Prior to admission the pt was independent with mobility without use of AD,living at home with her husband. The pt now presents with minor limitations in functional mobility, power, and activity tolerance due to above dx, and will continue to benefit from skilled PT to address these deficits, but is safe to d/c home with continued therapies and family support. The pt was able to complete bed mobility and initial transfers without physical assist, and hallway ambulation without assist or need for AD. The pt also completed stair navigation without use of rail or assistance. The pt states her mobility feels to be at baseline, but would benefit from higher level balance challenge to ensure safety and facilitate return to full independence.      Follow Up Recommendations Home health PT;Supervision for mobility/OOB    Equipment Recommendations  3in1 (PT)    Recommendations for Other Services       Precautions / Restrictions Precautions Precautions: Fall Restrictions Weight Bearing Restrictions: No      Mobility  Bed Mobility Overal bed mobility: Needs Assistance Bed Mobility: Supine to Sit     Supine to sit: Supervision     General bed mobility comments: supervision for line management    Transfers Overall transfer level: Needs assistance Equipment used: None Transfers: Sit to/from Stand Sit to Stand: Min guard         General transfer comment: minG for  safety, no physical assist given  Ambulation/Gait Ambulation/Gait assistance: Min guard;Supervision Gait Distance (Feet): 250 Feet Assistive device: None Gait Pattern/deviations: Step-through pattern;Decreased stride length Gait velocity: 0.66 m/s Gait velocity interpretation: 1.31 - 2.62 ft/sec, indicative of limited community ambulator General Gait Details: pt with slightly slowed gait and slightly narrow BOS, most challenged by way-finding with mobility, no LOB with changes in direction  Stairs Stairs: Yes Stairs assistance: Min guard Stair Management: No rails;Step to pattern;Forwards Number of Stairs: 5 General stair comments: pt able to complete without UE support, able to step up with either LE. No physical assist, but pt and daughter educated on supervision and use of rail for increased safety         Balance Overall balance assessment: Needs assistance;Mild deficits observed, not formally tested Sitting-balance support: No upper extremity supported;Feet supported Sitting balance-Leahy Scale: Good     Standing balance support: No upper extremity supported Standing balance-Leahy Scale: Good                               Pertinent Vitals/Pain Pain Assessment: No/denies pain    Home Living Family/patient expects to be discharged to:: Private residence Living Arrangements: Spouse/significant other Available Help at Discharge: Family;Available 24 hours/day Type of Home: House Home Access: Stairs to enter Entrance Stairs-Rails: Psychiatric nurse of Steps: 2 Home Layout: One level Home Equipment: Walker - 2 wheels;Cane - single point Additional Comments: pt has 2 daughters that live nearby    Prior Function Level of Independence: Independent  Comments: pt reports independent with mobility and ADLs, no recent falls. not driving as of reccently. Daughter present and confirms this information.     Hand Dominance   Dominant  Hand: Right    Extremity/Trunk Assessment   Upper Extremity Assessment Upper Extremity Assessment: Defer to OT evaluation    Lower Extremity Assessment Lower Extremity Assessment: Overall WFL for tasks assessed (pt denies difference in sensation, generally 4+/5 throughout LE)    Cervical / Trunk Assessment Cervical / Trunk Assessment: Normal  Communication   Communication: Expressive difficulties  Cognition Arousal/Alertness: Awake/alert Behavior During Therapy: WFL for tasks assessed/performed;Flat affect Overall Cognitive Status: Impaired/Different from baseline Area of Impairment: Memory;Safety/judgement;Problem solving                     Memory: Decreased recall of precautions;Decreased short-term memory   Safety/Judgement: Decreased awareness of deficits   Problem Solving: Slow processing;Difficulty sequencing;Requires verbal cues General Comments: Pt pleasant and sweet, but with intermittent difficulties with expression/word-finding. Able to self correct with time. Pt also with decreased memory within session, required multiple cues to recall room number and sequential assist/cues to complete wayfinding back to her room      General Comments General comments (skin integrity, edema, etc.): VSS through session, daughter present and supportive    Exercises     Assessment/Plan    PT Assessment Patient needs continued PT services  PT Problem List Decreased activity tolerance;Decreased balance;Decreased mobility;Decreased coordination;Decreased safety awareness       PT Treatment Interventions DME instruction;Gait training;Stair training;Functional mobility training;Therapeutic activities;Therapeutic exercise;Balance training;Cognitive remediation;Patient/family education    PT Goals (Current goals can be found in the Care Plan section)  Acute Rehab PT Goals Patient Stated Goal: return to doing things for herself (showering and cleaning the home) PT Goal  Formulation: With patient/family Time For Goal Achievement: 09/08/20 Potential to Achieve Goals: Good    Frequency Min 3X/week    AM-PAC PT "6 Clicks" Mobility  Outcome Measure Help needed turning from your back to your side while in a flat bed without using bedrails?: None Help needed moving from lying on your back to sitting on the side of a flat bed without using bedrails?: A Little Help needed moving to and from a bed to a chair (including a wheelchair)?: A Little Help needed standing up from a chair using your arms (e.g., wheelchair or bedside chair)?: A Little Help needed to walk in hospital room?: A Little Help needed climbing 3-5 steps with a railing? : A Little 6 Click Score: 19    End of Session Equipment Utilized During Treatment: Gait belt Activity Tolerance: Patient tolerated treatment well Patient left: in chair;with call bell/phone within reach;with chair alarm set;with family/visitor present Nurse Communication: Mobility status PT Visit Diagnosis: Unsteadiness on feet (R26.81);Other abnormalities of gait and mobility (R26.89)    Time: 0822-0901 PT Time Calculation (min) (ACUTE ONLY): 39 min   Charges:   PT Evaluation $PT Eval Low Complexity: 1 Low PT Treatments $Gait Training: 8-22 mins $Therapeutic Activity: 8-22 mins        Karma Ganja, PT, DPT   Acute Rehabilitation Department Pager #: [phone]  Otho Bellows 08/25/2020, 9:18 AM

## 2020-08-25 NOTE — TOC Transition Note (Signed)
Transition of Care [facility]) - CM/SW Discharge Note   Patient Details  Name: Hannah [name] MRN: [id number] Date of Birth: [birth year]  [facility] CM/SW Contact:  Ella Bodo, RN Phone Number: 08/25/2020, 2:17 PM   Clinical Narrative:   The pt is a 70 yo female presenting s/p L frontal craniotomy for tumor resection on 2/25. PMH includes lung adenocarcinoma with solitary brain metastasis resulting in progressive speech difficulties and R-sided weakness.  PTA, pt independent and living at home with spouse.  Husband and daughters to provide 24h assistance at home.  PT/OT recommending [place] follow up and patient agreeable to services.  Referral to [facility] for f/u, per pt choice.  Pt/daughter requests 3 in 1 BSC, but they prefer to have DME delivered to home.  Referral to [place] for DME needs.      Final next level of care: [place] Barriers to Discharge: Barriers Resolved   Patient Goals and CMS Choice Patient states their goals for this hospitalization and ongoing recovery are:: to go home CMS Medicare.gov Compare Post Acute Care list provided to:: Patient Choice offered to / list presented to : Patient  Discharge Placement                       Discharge Plan and Services   Discharge Planning Services: CM Consult Post Acute Care Choice: Home Health          DME Arranged: 3-N-1   Date DME Agency Contacted: 08/25/20 Time DME Agency Contacted: [id number] Representative spoke with at DME Agency: [place]: PT,OT [place]: [place] Date [place]: 08/25/20 Time [place]: 1416 Representative spoke with at [place]: [place] ([place]) Interventions     Readmission Risk Interventions No flowsheet data found.  Reinaldo Raddle, RN, BSN  Trauma/Neuro ICU Case Manager [phone]

## 2020-08-25 NOTE — Evaluation (Signed)
Occupational Therapy Evaluation Patient Details Name: Hannah [name] MRN: [id number] DOB: [birth year] Today's Date: 08/25/2020    History of Present Illness The pt is a 70 yo female presenting s/p L frontal craniotomy for tumor resection on 2/25. PMH includes lung adenocarcinoma with solitary brain metastasis resulting in progressive speech difficulties and R-sided weakness. Other PMH includes: anxiety, depression, GERD, HLD, HTN, and PVD.   Clinical Impression   Pt PTA: Pt was independent with ADL and mobility. Pt currently requiring supervision for safety. Pt performing all ADL tasks with no focal deficits and no weakness noted. Pt standing for toileting and grooming tasks at sink with no LOB episodes. Pt with decreased memory and deficits seen when following directions and unable to recall 2 mins after.pt states "I think I am groggy from the vicodin." Pt's daughter in room at end of session and pt will have 24/7 supervisionA at home. Pt would benefit from continued OT skilled services for cognition. OT following acutely.    Follow Up Recommendations  Home health OT;Supervision/Assistance - 24 hour    Equipment Recommendations  None recommended by OT    Recommendations for Other Services       Precautions / Restrictions Precautions Precautions: Fall Restrictions Weight Bearing Restrictions: No      Mobility Bed Mobility Overal bed mobility: Needs Assistance Bed Mobility: Supine to Sit     Supine to sit: Supervision     General bed mobility comments: supervision for line management    Transfers Overall transfer level: Needs assistance Equipment used: None Transfers: Sit to/from Stand Sit to Stand: Supervision         General transfer comment: supervisionA for safety, no LOB episodes    Balance Overall balance assessment: Needs assistance;Mild deficits observed, not formally tested Sitting-balance support: No upper extremity supported;Feet supported Sitting  balance-Leahy Scale: Good     Standing balance support: No upper extremity supported Standing balance-Leahy Scale: Good                             ADL either performed or assessed with clinical judgement   ADL Overall ADL's : At baseline                                       General ADL Comments: Pt requiring supervision for safety. pt performing all ADL tasks with no focal deficits and no weakness noted. Pt standing for toileting and grooming tasks at sink with no LOB episodes.     Vision Baseline Vision/History: No visual deficits Patient Visual Report: No change from baseline Vision Assessment?: No apparent visual deficits     Perception     Praxis      Pertinent Vitals/Pain Pain Assessment: No/denies pain     Hand Dominance Right   Extremity/Trunk Assessment Upper Extremity Assessment Upper Extremity Assessment: Generalized weakness   Lower Extremity Assessment Lower Extremity Assessment: Overall WFL for tasks assessed   Cervical / Trunk Assessment Cervical / Trunk Assessment: Normal   Communication Communication Communication: Expressive difficulties   Cognition Arousal/Alertness: Awake/alert Behavior During Therapy: WFL for tasks assessed/performed;Flat affect Overall Cognitive Status: Impaired/Different from baseline Area of Impairment: Memory                     Memory: Decreased recall of precautions;Decreased short-term memory         General  Comments: Pt pleasan, but difficulty with memory. Pt asked "please do not get up with me in the bathroom" and pt had risen up with no recollection of OTR asking to holler when she needed to stand.   General Comments  VSS on RA    Exercises     Shoulder Instructions      Home Living Family/patient expects to be discharged to:: Private residence Living Arrangements: Spouse/significant other Available Help at Discharge: Family;Available 24 hours/day Type of Home:  House Home Access: Stairs to enter [employer] of Steps: 2 Entrance Stairs-Rails: Right;Left Home Layout: One level     Bathroom Shower/Tub: Teacher, early years/pre: Standard     Home Equipment: Environmental consultant - 2 wheels;Cane - single point   Additional Comments: pt has 2 daughters that live nearby      Prior Functioning/Environment Level of Independence: Independent        Comments: pt reports independent with mobility and ADLs, no recent falls. not driving as of reccently. Daughter present and confirms this information.        OT Problem List: Decreased safety awareness;Decreased cognition      OT Treatment/Interventions:      OT Goals(Current goals can be found in the care plan section) Acute Rehab OT Goals Patient Stated Goal: return to doing things for herself (showering and cleaning the home) OT Goal Formulation: With patient Time For Goal Achievement: 09/08/20 Potential to Achieve Goals: Good ADL Goals Additional ADL Goal #1: Pt will sequence through ADL task x5 mins with no verbal cues to assist.  OT Frequency:     Barriers to D/C:            Co-evaluation              AM-PAC OT "6 Clicks" Daily Activity     Outcome Measure Help from another person eating meals?: None Help from another person taking care of personal grooming?: A Little Help from another person toileting, which includes using toliet, bedpan, or urinal?: A Little Help from another person bathing (including washing, rinsing, drying)?: A Little Help from another person to put on and taking off regular upper body clothing?: None Help from another person to put on and taking off regular lower body clothing?: A Little 6 Click Score: 20   End of Session Equipment Utilized During Treatment: Gait belt Nurse Communication: Mobility status  Activity Tolerance: Patient tolerated treatment well Patient left: in bed;with call bell/phone within reach;with bed alarm set;with  family/visitor present  OT Visit Diagnosis: Other symptoms and signs involving cognitive function                Time: 1245-1305 OT Time Calculation (min): 20 min Charges:  OT General Charges $OT Visit: 1 Visit OT Evaluation $OT Eval Moderate Complexity: 1 Mod  Jefferey Pica, OTR/L Acute Rehabilitation Services Pager: [phone] Office: [phone]  Shlomie Romig C 08/25/2020, 1:28 PM

## 2020-08-27 ENCOUNTER — Other Ambulatory Visit: Payer: Self-pay | Admitting: Radiation Therapy

## 2020-08-27 DIAGNOSIS — D496 Neoplasm of unspecified behavior of brain: Secondary | ICD-10-CM | POA: Diagnosis not present

## 2020-08-27 DIAGNOSIS — R531 Weakness: Secondary | ICD-10-CM | POA: Diagnosis not present

## 2020-08-28 ENCOUNTER — Encounter (HOSPITAL_COMMUNITY): Payer: Self-pay

## 2020-08-28 ENCOUNTER — Encounter (HOSPITAL_COMMUNITY): Payer: Self-pay | Admitting: Neurological Surgery

## 2020-08-28 ENCOUNTER — Telehealth: Payer: Self-pay | Admitting: Internal Medicine

## 2020-08-28 LAB — SURGICAL PATHOLOGY

## 2020-08-28 NOTE — Telephone Encounter (Signed)
Contacted patient about rescheduled appointment per provider on call scheduled. Spoke with daughter. Patient is aware.

## 2020-08-31 ENCOUNTER — Encounter: Payer: Self-pay | Admitting: *Deleted

## 2020-08-31 NOTE — Progress Notes (Signed)
[facility] report completed will place on Dr. Worthy Flank desk.

## 2020-09-03 ENCOUNTER — Inpatient Hospital Stay: Payer: Medicare HMO | Attending: Internal Medicine

## 2020-09-03 ENCOUNTER — Encounter: Payer: Medicare HMO | Admitting: Counselor

## 2020-09-03 DIAGNOSIS — Z79899 Other long term (current) drug therapy: Secondary | ICD-10-CM | POA: Insufficient documentation

## 2020-09-03 DIAGNOSIS — S0003XA Contusion of scalp, initial encounter: Secondary | ICD-10-CM | POA: Insufficient documentation

## 2020-09-03 DIAGNOSIS — C7931 Secondary malignant neoplasm of brain: Secondary | ICD-10-CM | POA: Insufficient documentation

## 2020-09-03 DIAGNOSIS — Z888 Allergy status to other drugs, medicaments and biological substances status: Secondary | ICD-10-CM | POA: Insufficient documentation

## 2020-09-03 DIAGNOSIS — X58XXXA Exposure to other specified factors, initial encounter: Secondary | ICD-10-CM | POA: Insufficient documentation

## 2020-09-03 DIAGNOSIS — C3411 Malignant neoplasm of upper lobe, right bronchus or lung: Secondary | ICD-10-CM | POA: Insufficient documentation

## 2020-09-03 DIAGNOSIS — Z8719 Personal history of other diseases of the digestive system: Secondary | ICD-10-CM | POA: Insufficient documentation

## 2020-09-03 DIAGNOSIS — I10 Essential (primary) hypertension: Secondary | ICD-10-CM | POA: Insufficient documentation

## 2020-09-03 DIAGNOSIS — F419 Anxiety disorder, unspecified: Secondary | ICD-10-CM | POA: Insufficient documentation

## 2020-09-03 DIAGNOSIS — K219 Gastro-esophageal reflux disease without esophagitis: Secondary | ICD-10-CM | POA: Insufficient documentation

## 2020-09-03 DIAGNOSIS — F32A Depression, unspecified: Secondary | ICD-10-CM | POA: Insufficient documentation

## 2020-09-03 DIAGNOSIS — E785 Hyperlipidemia, unspecified: Secondary | ICD-10-CM | POA: Insufficient documentation

## 2020-09-03 DIAGNOSIS — Z87891 Personal history of nicotine dependence: Secondary | ICD-10-CM | POA: Insufficient documentation

## 2020-09-04 ENCOUNTER — Other Ambulatory Visit: Payer: Self-pay | Admitting: *Deleted

## 2020-09-04 ENCOUNTER — Encounter: Payer: Self-pay | Admitting: Thoracic Surgery (Cardiothoracic Vascular Surgery)

## 2020-09-04 ENCOUNTER — Other Ambulatory Visit: Payer: Self-pay | Admitting: Thoracic Surgery (Cardiothoracic Vascular Surgery)

## 2020-09-04 ENCOUNTER — Institutional Professional Consult (permissible substitution): Payer: Medicare HMO | Admitting: Thoracic Surgery (Cardiothoracic Vascular Surgery)

## 2020-09-04 ENCOUNTER — Other Ambulatory Visit: Payer: Self-pay

## 2020-09-04 DIAGNOSIS — R918 Other nonspecific abnormal finding of lung field: Secondary | ICD-10-CM

## 2020-09-04 NOTE — Progress Notes (Signed)
PCP is Burns, Claudina Lick, MD Referring Provider is Heilingoetter, Cassandr*  Chief Complaint  Patient presents with  . Lung Mass    New patient consultation, CT biopsy 08/10/20, PET 08/08/20, Chest ct 08/02/20    HPI: Hannah [name] is sent for consultation regarding a right upper lobe squamous cell carcinoma.  Hannah [name] is a 70 year old woman with a history of tobacco abuse, right bundle branch block, mild to moderate AI, dyslipidemia, hypertension, reflux, anemia, osteoarthritis, glucose intolerance, moderate carotid disease, anxiety and depression.  She presented with expressive aphasia and right arm weakness.  She was found to have a brain metastasis with edema.  Her symptoms improved significantly with steroids.  She then underwent SRS followed by craniotomy and resection.  In addition her work-up included a CT of the chest abdomen pelvis which showed a 4.8 cm right upper lobe mass.  On PET CT the mass was hypermetabolic.  There was no mediastinal or hilar adenopathy and no evidence of distant metastases other than the isolated brain metastasis.  She had her SRS and then had a craniotomy on 08/24/2020.  She did well with that and went home the next day.  She has been walking without difficulty.  She did quit smoking prior to that admission.  She had about a 40-pack-year history of smoking prior to quitting.  She has had a 20 pound weight loss over 6 months with a 10 pound weight loss over the past 3 months.  She does complain of some decreased energy.  She has difficulty swallowing large pills but no other issues with that.  She was treated for some bleeding hemorrhoids in the past month as well.  Zubrod Score: At the time of surgery this patient's most appropriate activity status/level should be described as: []     0    Normal activity, no symptoms [x]     1    Restricted in physical strenuous activity but ambulatory, able to do out light work []     2    Ambulatory and capable of self care, unable  to do work activities, up and about >50 % of waking hours                              []     3    Only limited self care, in bed greater than 50% of waking hours []     4    Completely disabled, no self care, confined to bed or chair []     5    Moribund  Past Medical History:  Diagnosis Date  . Allergy   . Anemia   . Anxiety   . Arthritis   . Carpal tunnel syndrome   . Constipation    senna C stool softeners help   . Depression   . Diverticulosis   . Dyslipidemia   . External hemorrhoids   . GERD (gastroesophageal reflux disease)   . Heart murmur    mild-moderate AR  . Hiatal hernia   . Hyperlipidemia    on meds   . Hypertension   . Internal hemorrhoids   . Osteoarthritis   . Pre-diabetes   . PVD (peripheral vascular disease) (HCC)    moderate carotid disease  . RBBB   . Smoker   . Vocal cord polyps     Past Surgical History:  Procedure Laterality Date  . ABDOMINAL HYSTERECTOMY  1994  . APPLICATION OF CRANIAL NAVIGATION N/A 08/24/2020   Procedure:  APPLICATION OF CRANIAL NAVIGATION;  Surgeon: Judith Part, MD;  Location: [facility];  Service: Neurosurgery;  Laterality: N/A;  . COLONOSCOPY    . COLONOSCOPY W/ POLYPECTOMY  2009  . CRANIOTOMY Left 08/24/2020   Procedure: Left Craniotomy for Tumor Resection with Brainlab;  Surgeon: Judith Part, MD;  Location: [facility];  Service: Neurosurgery;  Laterality: Left;  . HEMORRHOID SURGERY    . JOINT REPLACEMENT    . POLYPECTOMY    . right total knee arthroplasty     Dr. Emeterio Reeve 06-04-18  . SPINE SURGERY  08/16/2009  . TOTAL KNEE ARTHROPLASTY Left 02/13/2014   Procedure: TOTAL KNEE ARTHROPLASTY;  Surgeon: Alta Corning, MD;  Location: [facility];  Service: Orthopedics;  Laterality: Left;  . TOTAL KNEE ARTHROPLASTY Right 06/04/2018   Procedure: RIGHT TOTAL KNEE ARTHROPLASTY;  Surgeon: Dorna Leitz, MD;  Location: [facility];  Service: Orthopedics;  Laterality: Right;  Adductor Block  . TUBAL LIGATION      Family History  Problem  Relation Age of Onset  . Cancer Father   . Prostate cancer Father   . Diabetes Sister   . Cancer Sister   . Stroke Maternal Grandfather   . Colitis Maternal Aunt   . Breast cancer Maternal Aunt   . Colon cancer Neg Hx   . Colon polyps Neg Hx   . Rectal cancer Neg Hx   . Stomach cancer Neg Hx   . Esophageal cancer Neg Hx     Social History Social History   Tobacco Use  . Smoking status: Former Smoker    Packs/day: 0.25    Years: 40.00    Pack years: 10.00    Types: Cigarettes    Quit date: 07/09/2020    Years since quitting: 0.1  . Smokeless tobacco: Never Used  . Tobacco comment: PACK WILL LAST 3 days  Vaping Use  . Vaping Use: Never used  Substance Use Topics  . Alcohol use: No  . Drug use: No    Current Outpatient Medications  Medication Sig Dispense Refill  . ALPRAZolam (XANAX) 0.5 MG tablet Take 1 tablet (0.5 mg total) by mouth 2 (two) times daily as needed. for sleep (Patient taking differently: Take 0.25 mg by mouth 2 (two) times daily as needed. for sleep) 60 tablet 5  . Ascorbic Acid (VITAMIN C PO) Take 1,000 mg by mouth daily.    Marland Kitchen aspirin 81 MG tablet Take 1 tablet (81 mg total) by mouth daily. Restart on 08/31/20 30 tablet   . calcium citrate-vitamin D (CITRACAL+D) 315-200 MG-UNIT tablet Take 1 tablet by mouth daily.    . carboxymethylcellulose (REFRESH PLUS) 0.5 % SOLN Place 1-2 drops into both eyes daily.    Marland Kitchen dexamethasone (DECADRON) 2 MG tablet Take 1 tablet (2 mg total) by mouth daily. Take with food. (Patient taking differently: Take 4 mg by mouth daily. Take with food.)    . fenofibrate 160 MG tablet Take 1 tablet (160 mg total) by mouth daily. 90 tablet 3  . hydrochlorothiazide (HYDRODIURIL) 12.5 MG tablet Take 1 tablet (12.5 mg total) by mouth daily. 90 tablet 1  . HYDROcodone-acetaminophen (NORCO/VICODIN) 5-325 MG tablet Take 1 tablet by mouth every 4 (four) hours as needed for moderate pain. 30 tablet 0  . magnesium hydroxide (MILK OF MAGNESIA) 400  MG/5ML suspension Take 30 mLs by mouth daily as needed for mild constipation.    . mirtazapine (REMERON) 15 MG tablet Take 0.5 tablets (7.5 mg total) by mouth at bedtime.  15 tablet 3  . naphazoline-glycerin (CLEAR EYES REDNESS) 0.012-0.2 % SOLN Place 1-2 drops into both eyes 4 (four) times daily as needed for eye irritation. bausch and lomb    . omeprazole (PRILOSEC) 40 MG capsule TAKE 1 CAPSULE TWICE DAILY BEFORE MEALS (Patient taking differently: Take 40 mg by mouth in the morning and at bedtime. TAKE 1 CAPSULE TWICE DAILY BEFORE MEALS) 180 capsule 1  . polyethylene glycol (MIRALAX / GLYCOLAX) 17 g packet Take 17 g by mouth 2 (two) times daily. 72 each 0  . pravastatin (PRAVACHOL) 40 MG tablet TAKE 1 TABLET EVERY EVENING (Patient taking differently: Take 40 mg by mouth daily.) 90 tablet 1  . Probiotic Product (PROBIOTIC DAILY PO) Take 1 capsule by mouth daily.    Marland Kitchen senna-docusate (SENOKOT-S) 8.6-50 MG tablet Take 1 tablet by mouth 2 (two) times daily.    . Wheat Dextrin (BENEFIBER) POWD Take 2 Scoops by mouth in the morning and at bedtime.     No current facility-administered medications for this visit.    Allergies  Allergen Reactions  . Amlodipine     Rash, swelling  . Chantix [Varenicline Tartrate] Other (See Comments)    Tongue swell,sob  . Clarithromycin Rash  . Lisinopril Hives  . Simvastatin Hives  . Wellbutrin [Bupropion Hcl] Hives  . Lipitor [Atorvastatin]     Dizziness per patient  . Sertraline     Makes her feel like she is going to kill someone    Review of Systems  Constitutional: Positive for activity change and unexpected weight change (Lost 20 pounds in 6 months, 10 pounds in 3 months).  HENT: Positive for trouble swallowing (Large pills). Negative for voice change.   Eyes: Negative for visual disturbance.  Respiratory: Negative for cough, shortness of breath and wheezing.   Cardiovascular: Positive for palpitations. Negative for chest pain and leg swelling.   Gastrointestinal: Positive for constipation. Negative for abdominal distention and abdominal pain.  Genitourinary: Negative for difficulty urinating and dyspareunia.  Musculoskeletal: Positive for arthralgias and joint swelling.  Neurological: Positive for speech difficulty (Resolved) and weakness (Resolved).       Recent brain surgery  Psychiatric/Behavioral: The patient is nervous/anxious.   All other systems reviewed and are negative.   BP (!) 154/76 (BP Location: Right Arm, Patient Position: Sitting, Cuff Size: Small)   Pulse 69   Temp 98.2 F (36.8 C) (Skin)   Resp 20   Ht 5' (1.524 m)   Wt 103 lb 12.8 oz (47.1 kg)   SpO2 97% Comment: RA  BMI 20.27 kg/m  Physical Exam Vitals reviewed.  Constitutional:      General: She is not in acute distress.    Appearance: Normal appearance.  HENT:     Head: Normocephalic.     Comments: Craniotomy wound clean Eyes:     General: No scleral icterus.    Extraocular Movements: Extraocular movements intact.  Neck:     Vascular: No carotid bruit.  Cardiovascular:     Rate and Rhythm: Normal rate and regular rhythm.     Heart sounds: Normal heart sounds. No murmur heard. No friction rub. No gallop.   Pulmonary:     Effort: Pulmonary effort is normal. No respiratory distress.     Breath sounds: Normal breath sounds. No wheezing.  Abdominal:     General: There is no distension.     Palpations: Abdomen is soft.     Tenderness: There is no abdominal tenderness.  Musculoskeletal:  Cervical back: Neck supple.  Lymphadenopathy:     Cervical: No cervical adenopathy.  Skin:    General: Skin is warm and dry.  Neurological:     General: No focal deficit present.     Mental Status: She is alert and oriented to person, place, and time.     Cranial Nerves: No cranial nerve deficit.     Motor: No weakness.    Diagnostic Tests:  NUCLEAR MEDICINE PET SKULL BASE TO THIGH  TECHNIQUE: 5.2 mCi F-18 FDG was injected intravenously.  Full-ring PET imaging was performed from the skull base to thigh after the radiotracer. CT data was obtained and used for attenuation correction and anatomic localization.  Fasting blood glucose: 87 mg/dl  COMPARISON:  Chest CT 08/02/2020.  FINDINGS: Mediastinal blood pool activity: SUV max 2.4  NECK:  No hypermetabolic cervical lymph nodes are identified.There is asymmetric hypermetabolic activity in the region of the right arytenoid cartilage (SUV max 6.4). No focal corresponding mucosal abnormality identified on the CT images. No other lesions of the pharyngeal mucosal space.  Incidental CT findings: Bilateral carotid atherosclerosis.  CHEST:  There are no hypermetabolic mediastinal, hilar or axillary lymph nodes. The previously demonstrated large peripheral right upper lobe mass demonstrates peripheral hypermetabolic activity with an SUV max of 11.0. This lesion is centrally necrotic with areas of fluid density and gas. It measures approximately 5.3 x 4.3 cm on image 26/8. The lesion extends to the visceral pleural surface, although demonstrates no chest wall invasion. Tiny lingular nodule on image 38/8 is unchanged.  Incidental CT findings: Mild centrilobular and paraseptal emphysema. Mild atherosclerosis of the aorta, great vessels and coronary arteries.  ABDOMEN/PELVIS:  There is no hypermetabolic activity within the liver, adrenal glands, spleen or pancreas. There is no hypermetabolic nodal activity. There is nonspecific low-level and erectile activity (SUV max 6.2). No other abnormal bowel activity.  Incidental CT findings: Calcified gallstones. Aortic and branch vessel atherosclerosis. A degree of rectal prolapse is suspected.  SKELETON:  There is no hypermetabolic activity to suggest osseous metastatic disease.  Incidental CT findings: none  IMPRESSION: 1. The previously demonstrated large right upper lobe mass is peripherally  hypermetabolic, consistent with bronchogenic carcinoma. This lesion demonstrates progressive central necrosis. 2. No evidence of metastatic disease. By imaging, this corresponds with stage II B disease (T3 N0 M0). 3. Nonspecific asymmetric activity associated with the arytenoid cartilage, potentially physiologic. Correlate with direct visualization if there are symptoms referable to this region. 4. Nonspecific anorectal activity, suspected to be related to a degree of rectal prolapse. Correlate clinically. 5. Incidental findings as previously described, including cholelithiasis and Aortic Atherosclerosis (ICD10-I70.0).   Electronically Signed   By: Richardean Sale M.D.   On: 08/08/2020 16:06 I personally reviewed the PET/CT images and also the CT chest images.  There is a 5 cm right upper lobe mass that is hypermetabolic and has central necrosis.  Findings consistent with a T3 primary.  No evidence of hilar or mediastinal nodes.  Impression: Hannah [name] is a 70 year old woman with a history of tobacco abuse, right bundle branch block, mild to moderate AI, dyslipidemia, hypertension, reflux, anemia, osteoarthritis, glucose intolerance, moderate carotid disease, anxiety and depression.  She presented with right arm weakness and expressive aphasia.  She was found to have a metastatic tumor in the left brain with significant edema.  Her neurologic symptoms improved with steroids.  A CT of the chest abdomen pelvis revealed a 5 cm right upper lobe mass.  CT-guided biopsy of showed  poorly differentiated adenocarcinoma.  She underwent SRS followed by resection.  Pathology showed a poorly differentiated carcinoma consistent with a lung primary.  Stage IV adenocarcinoma of the lung-she has stage IV disease with a solitary brain metastasis.  I discussed with Hannah [name] and her family that the data indicates that surgery can be beneficial in the setting with approximately 25% chance of cure.  We also  discussed the alternative of chemotherapy and radiation.  In all likelihood they will want to give chemotherapy after surgical resection as well.  She and her family strongly favor proceeding with surgical resection.  From talking with her and her family I think she will have adequate pulmonary reserve but we do need to check pulmonary function testing with and without bronchodilators.  Aortic insufficiency -she has a history of mild to moderate AI and mild MR on echocardiogram from 2015.  No interval follow-up of that.  We will repeat a 2D echo to make sure there has not been progression of that prior to surgery.  Brain metastasis -symptomatic improvement with steroids.  Follow-up with Dr. Zada Finders as scheduled.  Unclear how long she will need to remain on steroids.  We discussed the surgical procedure which would be robotic right upper lobectomy.  The lesion does abut the chest wall there is no clear evidence of invasion but it is possible that a partial chest wall resection would be necessary.  We will also do a complete node dissection with a lobectomy.  We discussed the need for general anesthesia, the incisions to be used, the use of drains to postoperatively, the expected hospital stay, and the overall recovery.  I informed him of the indications, risks, benefits, and alternatives.  They understand the risks include, but not limited to death, MI, DVT, PE, bleeding, possible need for transfusion, infection, prolonged air leak, cardiac arrhythmias, as well as possibility of other unforeseeable complications.    Plan: 2D echo to follow-up mild to moderate AI and mild MR seen on echo in 2015 Pulmonary function testing with and without bronchodilators Tentatively plan robotic right upper lobectomy on Monday, 09/24/2020  Melrose Nakayama, MD [facility] [phone]

## 2020-09-04 NOTE — H&P (View-Only) (Signed)
PCP is Burns, Claudina Lick, MD Referring Provider is Heilingoetter, Cassandr*  Chief Complaint  Patient presents with  . Lung Mass    New patient consultation, CT biopsy 08/10/20, PET 08/08/20, Chest ct 08/02/20    HPI: [name] is sent for consultation regarding a right upper lobe squamous cell carcinoma.  Hannah [name] is a 70 year old woman with a history of tobacco abuse, right bundle branch block, mild to moderate AI, dyslipidemia, hypertension, reflux, anemia, osteoarthritis, glucose intolerance, moderate carotid disease, anxiety and depression.  She presented with expressive aphasia and right arm weakness.  She was found to have a brain metastasis with edema.  Her symptoms improved significantly with steroids.  She then underwent SRS followed by craniotomy and resection.  In addition her work-up included a CT of the chest abdomen pelvis which showed a 4.8 cm right upper lobe mass.  On PET CT the mass was hypermetabolic.  There was no mediastinal or hilar adenopathy and no evidence of distant metastases other than the isolated brain metastasis.  She had her SRS and then had a craniotomy on 08/24/2020.  She did well with that and went home the next day.  She has been walking without difficulty.  She did quit smoking prior to that admission.  She had about a 40-pack-year history of smoking prior to quitting.  She has had a 20 pound weight loss over 6 months with a 10 pound weight loss over the past 3 months.  She does complain of some decreased energy.  She has difficulty swallowing large pills but no other issues with that.  She was treated for some bleeding hemorrhoids in the past month as well.  Zubrod Score: At the time of surgery this patient's most appropriate activity status/level should be described as: []     0    Normal activity, no symptoms [x]     1    Restricted in physical strenuous activity but ambulatory, able to do out light work []     2    Ambulatory and capable of self care, unable  to do work activities, up and about >50 % of waking hours                              []     3    Only limited self care, in bed greater than 50% of waking hours []     4    Completely disabled, no self care, confined to bed or chair []     5    Moribund  Past Medical History:  Diagnosis Date  . Allergy   . Anemia   . Anxiety   . Arthritis   . Carpal tunnel syndrome   . Constipation    senna C stool softeners help   . Depression   . Diverticulosis   . Dyslipidemia   . External hemorrhoids   . GERD (gastroesophageal reflux disease)   . Heart murmur    mild-moderate AR  . Hiatal hernia   . Hyperlipidemia    on meds   . Hypertension   . Internal hemorrhoids   . Osteoarthritis   . Pre-diabetes   . PVD (peripheral vascular disease) (HCC)    moderate carotid disease  . RBBB   . Smoker   . Vocal cord polyps     Past Surgical History:  Procedure Laterality Date  . ABDOMINAL HYSTERECTOMY  1994  . APPLICATION OF CRANIAL NAVIGATION N/A 08/24/2020   Procedure:  APPLICATION OF CRANIAL NAVIGATION;  Surgeon: Judith Part, MD;  Location: [facility];  Service: Neurosurgery;  Laterality: N/A;  . COLONOSCOPY    . COLONOSCOPY W/ POLYPECTOMY  2009  . CRANIOTOMY Left 08/24/2020   Procedure: Left Craniotomy for Tumor Resection with Brainlab;  Surgeon: Judith Part, MD;  Location: [facility];  Service: Neurosurgery;  Laterality: Left;  . HEMORRHOID SURGERY    . JOINT REPLACEMENT    . POLYPECTOMY    . right total knee arthroplasty     Dr. Emeterio Reeve 06-04-18  . SPINE SURGERY  08/16/2009  . TOTAL KNEE ARTHROPLASTY Left 02/13/2014   Procedure: TOTAL KNEE ARTHROPLASTY;  Surgeon: Alta Corning, MD;  Location: [facility];  Service: Orthopedics;  Laterality: Left;  . TOTAL KNEE ARTHROPLASTY Right 06/04/2018   Procedure: RIGHT TOTAL KNEE ARTHROPLASTY;  Surgeon: Dorna Leitz, MD;  Location: [facility];  Service: Orthopedics;  Laterality: Right;  Adductor Block  . TUBAL LIGATION      Family History  Problem  Relation Age of Onset  . Cancer Father   . Prostate cancer Father   . Diabetes Sister   . Cancer Sister   . Stroke Maternal Grandfather   . Colitis Maternal Aunt   . Breast cancer Maternal Aunt   . Colon cancer Neg Hx   . Colon polyps Neg Hx   . Rectal cancer Neg Hx   . Stomach cancer Neg Hx   . Esophageal cancer Neg Hx     Social History Social History   Tobacco Use  . Smoking status: Former Smoker    Packs/day: 0.25    Years: 40.00    Pack years: 10.00    Types: Cigarettes    Quit date: 07/09/2020    Years since quitting: 0.1  . Smokeless tobacco: Never Used  . Tobacco comment: PACK WILL LAST 3 days  Vaping Use  . Vaping Use: Never used  Substance Use Topics  . Alcohol use: No  . Drug use: No    Current Outpatient Medications  Medication Sig Dispense Refill  . ALPRAZolam (XANAX) 0.5 MG tablet Take 1 tablet (0.5 mg total) by mouth 2 (two) times daily as needed. for sleep (Patient taking differently: Take 0.25 mg by mouth 2 (two) times daily as needed. for sleep) 60 tablet 5  . Ascorbic Acid (VITAMIN C PO) Take 1,000 mg by mouth daily.    Marland Kitchen aspirin 81 MG tablet Take 1 tablet (81 mg total) by mouth daily. Restart on 08/31/20 30 tablet   . calcium citrate-vitamin D (CITRACAL+D) 315-200 MG-UNIT tablet Take 1 tablet by mouth daily.    . carboxymethylcellulose (REFRESH PLUS) 0.5 % SOLN Place 1-2 drops into both eyes daily.    Marland Kitchen dexamethasone (DECADRON) 2 MG tablet Take 1 tablet (2 mg total) by mouth daily. Take with food. (Patient taking differently: Take 4 mg by mouth daily. Take with food.)    . fenofibrate 160 MG tablet Take 1 tablet (160 mg total) by mouth daily. 90 tablet 3  . hydrochlorothiazide (HYDRODIURIL) 12.5 MG tablet Take 1 tablet (12.5 mg total) by mouth daily. 90 tablet 1  . HYDROcodone-acetaminophen (NORCO/VICODIN) 5-325 MG tablet Take 1 tablet by mouth every 4 (four) hours as needed for moderate pain. 30 tablet 0  . magnesium hydroxide (MILK OF MAGNESIA) 400  MG/5ML suspension Take 30 mLs by mouth daily as needed for mild constipation.    . mirtazapine (REMERON) 15 MG tablet Take 0.5 tablets (7.5 mg total) by mouth at bedtime.  15 tablet 3  . naphazoline-glycerin (CLEAR EYES REDNESS) 0.012-0.2 % SOLN Place 1-2 drops into both eyes 4 (four) times daily as needed for eye irritation. bausch and lomb    . omeprazole (PRILOSEC) 40 MG capsule TAKE 1 CAPSULE TWICE DAILY BEFORE MEALS (Patient taking differently: Take 40 mg by mouth in the morning and at bedtime. TAKE 1 CAPSULE TWICE DAILY BEFORE MEALS) 180 capsule 1  . polyethylene glycol (MIRALAX / GLYCOLAX) 17 g packet Take 17 g by mouth 2 (two) times daily. 72 each 0  . pravastatin (PRAVACHOL) 40 MG tablet TAKE 1 TABLET EVERY EVENING (Patient taking differently: Take 40 mg by mouth daily.) 90 tablet 1  . Probiotic Product (PROBIOTIC DAILY PO) Take 1 capsule by mouth daily.    Marland Kitchen senna-docusate (SENOKOT-S) 8.6-50 MG tablet Take 1 tablet by mouth 2 (two) times daily.    . Wheat Dextrin (BENEFIBER) POWD Take 2 Scoops by mouth in the morning and at bedtime.     No current facility-administered medications for this visit.    Allergies  Allergen Reactions  . Amlodipine     Rash, swelling  . Chantix [Varenicline Tartrate] Other (See Comments)    Tongue swell,sob  . Clarithromycin Rash  . Lisinopril Hives  . Simvastatin Hives  . Wellbutrin [Bupropion Hcl] Hives  . Lipitor [Atorvastatin]     Dizziness per patient  . Sertraline     Makes her feel like she is going to kill someone    Review of Systems  Constitutional: Positive for activity change and unexpected weight change (Lost 20 pounds in 6 months, 10 pounds in 3 months).  HENT: Positive for trouble swallowing (Large pills). Negative for voice change.   Eyes: Negative for visual disturbance.  Respiratory: Negative for cough, shortness of breath and wheezing.   Cardiovascular: Positive for palpitations. Negative for chest pain and leg swelling.   Gastrointestinal: Positive for constipation. Negative for abdominal distention and abdominal pain.  Genitourinary: Negative for difficulty urinating and dyspareunia.  Musculoskeletal: Positive for arthralgias and joint swelling.  Neurological: Positive for speech difficulty (Resolved) and weakness (Resolved).       Recent brain surgery  Psychiatric/Behavioral: The patient is nervous/anxious.   All other systems reviewed and are negative.   BP (!) 154/76 (BP Location: Right Arm, Patient Position: Sitting, Cuff Size: Small)   Pulse 69   Temp 98.2 F (36.8 C) (Skin)   Resp 20   Ht 5' (1.524 m)   Wt 103 lb 12.8 oz (47.1 kg)   SpO2 97% Comment: RA  BMI 20.27 kg/m  Physical Exam Vitals reviewed.  Constitutional:      General: She is not in acute distress.    Appearance: Normal appearance.  HENT:     Head: Normocephalic.     Comments: Craniotomy wound clean Eyes:     General: No scleral icterus.    Extraocular Movements: Extraocular movements intact.  Neck:     Vascular: No carotid bruit.  Cardiovascular:     Rate and Rhythm: Normal rate and regular rhythm.     Heart sounds: Normal heart sounds. No murmur heard. No friction rub. No gallop.   Pulmonary:     Effort: Pulmonary effort is normal. No respiratory distress.     Breath sounds: Normal breath sounds. No wheezing.  Abdominal:     General: There is no distension.     Palpations: Abdomen is soft.     Tenderness: There is no abdominal tenderness.  Musculoskeletal:  Cervical back: Neck supple.  Lymphadenopathy:     Cervical: No cervical adenopathy.  Skin:    General: Skin is warm and dry.  Neurological:     General: No focal deficit present.     Mental Status: She is alert and oriented to person, place, and time.     Cranial Nerves: No cranial nerve deficit.     Motor: No weakness.    Diagnostic Tests:  NUCLEAR MEDICINE PET SKULL BASE TO THIGH  TECHNIQUE: 5.2 mCi F-18 FDG was injected intravenously.  Full-ring PET imaging was performed from the skull base to thigh after the radiotracer. CT data was obtained and used for attenuation correction and anatomic localization.  Fasting blood glucose: 87 mg/dl  COMPARISON:  Chest CT 08/02/2020.  FINDINGS: Mediastinal blood pool activity: SUV max 2.4  NECK:  No hypermetabolic cervical lymph nodes are identified.There is asymmetric hypermetabolic activity in the region of the right arytenoid cartilage (SUV max 6.4). No focal corresponding mucosal abnormality identified on the CT images. No other lesions of the pharyngeal mucosal space.  Incidental CT findings: Bilateral carotid atherosclerosis.  CHEST:  There are no hypermetabolic mediastinal, hilar or axillary lymph nodes. The previously demonstrated large peripheral right upper lobe mass demonstrates peripheral hypermetabolic activity with an SUV max of 11.0. This lesion is centrally necrotic with areas of fluid density and gas. It measures approximately 5.3 x 4.3 cm on image 26/8. The lesion extends to the visceral pleural surface, although demonstrates no chest wall invasion. Tiny lingular nodule on image 38/8 is unchanged.  Incidental CT findings: Mild centrilobular and paraseptal emphysema. Mild atherosclerosis of the aorta, great vessels and coronary arteries.  ABDOMEN/PELVIS:  There is no hypermetabolic activity within the liver, adrenal glands, spleen or pancreas. There is no hypermetabolic nodal activity. There is nonspecific low-level and erectile activity (SUV max 6.2). No other abnormal bowel activity.  Incidental CT findings: Calcified gallstones. Aortic and branch vessel atherosclerosis. A degree of rectal prolapse is suspected.  SKELETON:  There is no hypermetabolic activity to suggest osseous metastatic disease.  Incidental CT findings: none  IMPRESSION: 1. The previously demonstrated large right upper lobe mass is peripherally  hypermetabolic, consistent with bronchogenic carcinoma. This lesion demonstrates progressive central necrosis. 2. No evidence of metastatic disease. By imaging, this corresponds with stage II B disease (T3 N0 M0). 3. Nonspecific asymmetric activity associated with the arytenoid cartilage, potentially physiologic. Correlate with direct visualization if there are symptoms referable to this region. 4. Nonspecific anorectal activity, suspected to be related to a degree of rectal prolapse. Correlate clinically. 5. Incidental findings as previously described, including cholelithiasis and Aortic Atherosclerosis (ICD10-I70.0).   Electronically Signed   By: Richardean Sale M.D.   On: 08/08/2020 16:06 I personally reviewed the PET/CT images and also the CT chest images.  There is a 5 cm right upper lobe mass that is hypermetabolic and has central necrosis.  Findings consistent with a T3 primary.  No evidence of hilar or mediastinal nodes.  Impression: Hannah [name] is a 70 year old woman with a history of tobacco abuse, right bundle branch block, mild to moderate AI, dyslipidemia, hypertension, reflux, anemia, osteoarthritis, glucose intolerance, moderate carotid disease, anxiety and depression.  She presented with right arm weakness and expressive aphasia.  She was found to have a metastatic tumor in the left brain with significant edema.  Her neurologic symptoms improved with steroids.  A CT of the chest abdomen pelvis revealed a 5 cm right upper lobe mass.  CT-guided biopsy of showed  poorly differentiated adenocarcinoma.  She underwent SRS followed by resection.  Pathology showed a poorly differentiated carcinoma consistent with a lung primary.  Stage IV adenocarcinoma of the lung-she has stage IV disease with a solitary brain metastasis.  I discussed with Mrs. Follmer and her family that the data indicates that surgery can be beneficial in the setting with approximately 25% chance of cure.  We also  discussed the alternative of chemotherapy and radiation.  In all likelihood they will want to give chemotherapy after surgical resection as well.  She and her family strongly favor proceeding with surgical resection.  From talking with her and her family I think she will have adequate pulmonary reserve but we do need to check pulmonary function testing with and without bronchodilators.  Aortic insufficiency -she has a history of mild to moderate AI and mild MR on echocardiogram from 2015.  No interval follow-up of that.  We will repeat a 2D echo to make sure there has not been progression of that prior to surgery.  Brain metastasis -symptomatic improvement with steroids.  Follow-up with Dr. Zada Finders as scheduled.  Unclear how long she will need to remain on steroids.  We discussed the surgical procedure which would be robotic right upper lobectomy.  The lesion does abut the chest wall there is no clear evidence of invasion but it is possible that a partial chest wall resection would be necessary.  We will also do a complete node dissection with a lobectomy.  We discussed the need for general anesthesia, the incisions to be used, the use of drains to postoperatively, the expected hospital stay, and the overall recovery.  I informed him of the indications, risks, benefits, and alternatives.  They understand the risks include, but not limited to death, MI, DVT, PE, bleeding, possible need for transfusion, infection, prolonged air leak, cardiac arrhythmias, as well as possibility of other unforeseeable complications.    Plan: 2D echo to follow-up mild to moderate AI and mild MR seen on echo in 2015 Pulmonary function testing with and without bronchodilators Tentatively plan robotic right upper lobectomy on Monday, 09/24/2020  Melrose Nakayama, MD [facility] [phone]

## 2020-09-05 ENCOUNTER — Telehealth: Payer: Self-pay | Admitting: Medical Oncology

## 2020-09-05 NOTE — Telephone Encounter (Signed)
authorization approved for " 96283-M unit" has been approved from 08/10/20-11/07/20 Confirmation # [id number].

## 2020-09-06 ENCOUNTER — Ambulatory Visit: Payer: Medicare HMO | Admitting: Nutrition

## 2020-09-06 ENCOUNTER — Inpatient Hospital Stay: Payer: Medicare HMO | Admitting: Nutrition

## 2020-09-06 ENCOUNTER — Other Ambulatory Visit: Payer: Self-pay

## 2020-09-06 ENCOUNTER — Ambulatory Visit (HOSPITAL_COMMUNITY)
Admission: RE | Admit: 2020-09-06 | Discharge: 2020-09-06 | Disposition: A | Discharge status: Home / Self Care | Payer: Medicare HMO | Source: Ambulatory Visit | Attending: Thoracic Surgery (Cardiothoracic Vascular Surgery) | Admitting: Thoracic Surgery (Cardiothoracic Vascular Surgery)

## 2020-09-06 DIAGNOSIS — Z87891 Personal history of nicotine dependence: Secondary | ICD-10-CM | POA: Insufficient documentation

## 2020-09-06 DIAGNOSIS — E785 Hyperlipidemia, unspecified: Secondary | ICD-10-CM | POA: Insufficient documentation

## 2020-09-06 DIAGNOSIS — I358 Other nonrheumatic aortic valve disorders: Secondary | ICD-10-CM | POA: Diagnosis not present

## 2020-09-06 DIAGNOSIS — I517 Cardiomegaly: Secondary | ICD-10-CM | POA: Diagnosis not present

## 2020-09-06 DIAGNOSIS — I359 Nonrheumatic aortic valve disorder, unspecified: Secondary | ICD-10-CM | POA: Diagnosis not present

## 2020-09-06 DIAGNOSIS — R918 Other nonspecific abnormal finding of lung field: Secondary | ICD-10-CM | POA: Insufficient documentation

## 2020-09-06 DIAGNOSIS — I4510 Unspecified right bundle-branch block: Secondary | ICD-10-CM | POA: Insufficient documentation

## 2020-09-06 DIAGNOSIS — I348 Other nonrheumatic mitral valve disorders: Secondary | ICD-10-CM | POA: Diagnosis not present

## 2020-09-06 DIAGNOSIS — I119 Hypertensive heart disease without heart failure: Secondary | ICD-10-CM | POA: Diagnosis not present

## 2020-09-06 DIAGNOSIS — I351 Nonrheumatic aortic (valve) insufficiency: Secondary | ICD-10-CM | POA: Diagnosis not present

## 2020-09-06 LAB — ECHOCARDIOGRAM COMPLETE
AR max vel: 2.46 cm2
AV Area VTI: 2.33 cm2
AV Area mean vel: 2.23 cm2
AV Mean grad: 4 mmHg
AV Peak grad: 8.1 mmHg
Ao pk vel: 1.42 m/s
Area-P 1/2: 3.65 cm2
P 1/2 time: 557 msec
S' Lateral: 2.2 cm

## 2020-09-06 NOTE — Progress Notes (Signed)
70 year old female diagnosed with metastatic lung cancer.  She is followed by Dr. Julien Nordmann.  Past medical history includes prediabetes, hypertension, hyperlipidemia, GERD, diverticulitis, depression, constipation, and anxiety.  Medications include Xanax, vitamin C, vitamin D, Decadron, milk of magnesia, Remeron, Prilosec, probiotic, Senokot-S, Benefiber.  Labs were reviewed.  Height: 5 feet 0 inches. Weight: 103.8 pounds September 04, 2020. Usual body weight: 123 pounds in March 2021. BMI: 20.12.  Patient reports decreased appetite associated with weight loss.  She has had recent hospitalization and reports constipation/impaction.  She never wants to go through that again and is fearful of eating.  She has never been a big eater anyway.  She denies nausea and vomiting.  Reports she loves vanilla Carnation breakfast essentials and drinks 3-4 bottles daily.  She is concerned about sugar and cancer.  Nutrition diagnosis: Unintended weight loss related to decreased appetite and inadequate oral intake as evidenced by 15% weight loss from usual body weight.  Intervention: Educated to consume smaller more frequent meals and snacks with high-calorie, high-protein foods. Increase fiber gradually.  Drink minimum of 64-80 ounces of water a day.  Continue bowel regimen. Continue Carnation breakfast essentials 4 times daily.  Provided samples of other high-calorie high-protein supplements. Educated on snack choices. Educated on sugar and cancer. Provided nutrition facts sheets.  Questions were answered.  Teach back method used.  Contact information provided.  Monitoring, evaluation, goals: Patient will tolerate increased calories and protein to minimize weight loss.  Next visit: To be scheduled as needed.  **Disclaimer: This note was dictated with voice recognition software. Similar sounding words can inadvertently be transcribed and this note may contain transcription errors which may not have been  corrected upon publication of note.**

## 2020-09-06 NOTE — Progress Notes (Signed)
  Echocardiogram 2D Echocardiogram has been performed.  Hannah [name] 09/06/2020, 3:02 PM

## 2020-09-10 ENCOUNTER — Encounter: Payer: Medicare HMO | Admitting: Counselor

## 2020-09-11 ENCOUNTER — Encounter: Payer: Medicare HMO | Admitting: Nutrition

## 2020-09-12 DIAGNOSIS — I700 Atherosclerosis of aorta: Secondary | ICD-10-CM | POA: Insufficient documentation

## 2020-09-12 NOTE — Progress Notes (Signed)
Subjective:    Patient ID: Hannah [name], female    DOB: [birth year], 70 y.o.   MRN: [id number]  HPI The patient is here for follow up of their chronic medical problems, including metastatic lung cancer, DM, htn, hyperlipidemia, GERD, depression, anxiety, decreased appetite  Had PET scan and Ct guided biopsy of lung lesion  Had SRS to brain lesion and left craniotomy and tumor resection  To have robotic assisted thorascopy RUL lobectomy on the 28th   She is eating good.  She is on 1 mg of steroids.  Coming off the steroids have made her very emotional.  Overall, she feels she is doing well.      Medications and allergies reviewed with patient and updated if appropriate.  Patient Active Problem List   Diagnosis Date Noted   Aortic atherosclerosis ([place]) 09/12/2020   Lung mass 09/04/2020   Status post craniotomy 08/24/2020   Brain tumor ([place]) 08/24/2020   Hematochezia 08/19/2020   Acute blood loss anemia    Rectal bleeding 08/17/2020   Primary cancer of right upper lobe of lung ([place]) 08/07/2020   Brain metastasis ([place]) 08/06/2020   Memory changes 07/24/2020   Non-recurrent unilateral inguinal hernia without obstruction or gangrene 07/24/2020   Pain of right clavicle 09/12/2019   Nonspecific abnormal electrocardiogram (ECG) (EKG) 10/20/2018   Educated about COVID-19 virus infection 10/20/2018   Weight loss 07/17/2018   Depression 07/17/2018   Primary osteoarthritis of right knee 06/04/2018   Bilateral carotid artery stenosis 09/24/2017   Aortic valve sclerosis 09/24/2017   Vocal cord polyps 09/07/2017   Dysphagia 09/07/2017   Diabetes mellitus without complication ([place]) 51/07/5850   GERD (gastroesophageal reflux disease) 02/27/2016   Anxiety 02/27/2016   S/P total knee replacement 02/13/2014   RBBB 02/08/2014   HTN (hypertension) 02/08/2014   PVD - bilateral 60-79% carotid strenosis 02/08/2014   Mixed hyperlipidemia 11/29/2013   Carpal  tunnel syndrome, bilateral 11/23/2013   Murmur- mild -mod AR, mild MR 11/10/2013   Constipation 12/16/2012    Current Outpatient Medications on File Prior to Visit  Medication Sig Dispense Refill   Ascorbic Acid (VITAMIN C) 1000 MG tablet Take 1,000 mg by mouth daily.     aspirin 81 MG tablet Take 1 tablet (81 mg total) by mouth daily. Restart on 08/31/20 30 tablet    carboxymethylcellulose (REFRESH PLUS) 0.5 % SOLN Place 1-2 drops into both eyes daily.     dexamethasone (DECADRON) 2 MG tablet Take 1 tablet (2 mg total) by mouth daily. Take with food. (Patient taking differently: Take 1 mg by mouth daily. Take with food.)     HYDROcodone-acetaminophen (NORCO/VICODIN) 5-325 MG tablet Take 1 tablet by mouth every 4 (four) hours as needed for moderate pain. 30 tablet 0   loratadine (CLARITIN) 10 MG tablet Take 10 mg by mouth daily as needed for allergies.     neomycin-bacitracin-polymyxin (NEOSPORIN) ointment Apply 1 application topically as needed for wound care.     polyethylene glycol (MIRALAX / GLYCOLAX) 17 g packet Take 17 g by mouth 2 (two) times daily. 72 each 0   Probiotic Product (PROBIOTIC DAILY PO) Take 1 capsule by mouth daily.     senna-docusate (SENOKOT-S) 8.6-50 MG tablet Take 1 tablet by mouth 2 (two) times daily. (Patient taking differently: Take 1 tablet by mouth daily as needed for mild constipation.)     Wheat Dextrin (BENEFIBER) POWD Take 1 Scoop by mouth 2 (two) times daily as needed (constipation).  No current facility-administered medications on file prior to visit.    Past Medical History:  Diagnosis Date   Allergy    Anemia    Anxiety    Arthritis    Carpal tunnel syndrome    Constipation    senna C stool softeners help    Depression    Diverticulosis    Dyslipidemia    External hemorrhoids    GERD (gastroesophageal reflux disease)    Heart murmur    mild-moderate AR   Hiatal hernia    Hyperlipidemia    on meds     Hypertension    Internal hemorrhoids    Osteoarthritis    Pre-diabetes    PVD (peripheral vascular disease) ([facility])    moderate carotid disease   RBBB    Smoker    Vocal cord polyps     Past Surgical History:  Procedure Laterality Date   ABDOMINAL HYSTERECTOMY  3151   APPLICATION OF CRANIAL NAVIGATION N/A 08/24/2020   Procedure: APPLICATION OF CRANIAL NAVIGATION;  Surgeon: Judith Part, MD;  Location: [place];  Service: Neurosurgery;  Laterality: N/A;   COLONOSCOPY     COLONOSCOPY W/ POLYPECTOMY  2009   CRANIOTOMY Left 08/24/2020   Procedure: Left Craniotomy for Tumor Resection with Brainlab;  Surgeon: Judith Part, MD;  Location: [place];  Service: Neurosurgery;  Laterality: Left;   HEMORRHOID SURGERY     JOINT REPLACEMENT     POLYPECTOMY     right total knee arthroplasty     Dr. Emeterio Reeve 06-04-18   SPINE SURGERY  08/16/2009   TOTAL KNEE ARTHROPLASTY Left 02/13/2014   Procedure: TOTAL KNEE ARTHROPLASTY;  Surgeon: Alta Corning, MD;  Location: [place];  Service: Orthopedics;  Laterality: Left;   TOTAL KNEE ARTHROPLASTY Right 06/04/2018   Procedure: RIGHT TOTAL KNEE ARTHROPLASTY;  Surgeon: Dorna Leitz, MD;  Location: [facility];  Service: Orthopedics;  Laterality: Right;  Adductor Block   TUBAL LIGATION      Social History   Socioeconomic History   Marital status: Married    Spouse name: Not on file   Number of children: 2   Years of education: Not on file   Highest education level: Not on file  Occupational History   Not on file  Tobacco Use   Smoking status: Former Smoker    Packs/day: 0.25    Years: 40.00    Pack years: 10.00    Types: Cigarettes    Quit date: 07/09/2020    Years since quitting: 0.1   Smokeless tobacco: Never Used   Tobacco comment: PACK WILL LAST 3 days  Vaping Use   Vaping Use: Never used  Substance and Sexual Activity   Alcohol use: No   Drug use: No   Sexual activity: Not Currently  Other Topics Concern    Not on file  Social History Narrative   Not on file   Social Determinants of Health   Financial Resource Strain: Low Risk    Difficulty of Paying Living Expenses: Not hard at all  Food Insecurity: No Food Insecurity   Worried About Charity fundraiser in the Last Year: Never true   [place] in the Last Year: Never true  Transportation Needs: No Transportation Needs   Lack of Transportation (Medical): No   Lack of Transportation (Non-Medical): No  Physical Activity: Sufficiently Active   Days of Exercise per Week: 4 days   Minutes of Exercise per Session: 50 min  Stress: No Stress  Concern Present   Feeling of Stress : Not at all  Social Connections: Socially Integrated   Frequency of Communication with Friends and Family: More than three times a week   Frequency of Social Gatherings with Friends and Family: More than three times a week   Attends Religious Services: More than 4 times per year   Active Member of [employer] or Organizations: Yes   Attends Music therapist: More than 4 times per year   Marital Status: Married    Family History  Problem Relation Age of Onset   Cancer Father    Prostate cancer Father    Diabetes Sister    Cancer Sister    Stroke Maternal Grandfather    Colitis Maternal Aunt    Breast cancer Maternal Aunt    Colon cancer Neg Hx    Colon polyps Neg Hx    Rectal cancer Neg Hx    Stomach cancer Neg Hx    Esophageal cancer Neg Hx     Review of Systems  Constitutional: Negative for chills and fever.  HENT: Positive for congestion (from allergies).   Respiratory: Negative for cough, shortness of breath and wheezing.   Cardiovascular: Positive for palpitations (occ with anxiety). Negative for chest pain and leg swelling.  Neurological: Negative for dizziness, light-headedness and headaches.       Objective:   Vitals:   09/13/20 1030  BP: 136/62  Pulse: 66  Temp: 98 F (36.7 C)  SpO2: 99%   BP  Readings from Last 3 Encounters:  09/13/20 136/62  09/04/20 (!) 154/76  08/25/20 (!) 141/76   Wt Readings from Last 3 Encounters:  09/13/20 105 lb (47.6 kg)  09/04/20 103 lb 12.8 oz (47.1 kg)  08/24/20 103 lb (46.7 kg)   Body mass index is 20.51 kg/m.   Physical Exam    Constitutional: Appears well-developed and well-nourished. No distress.  HENT:  Head: Normocephalic and atraumatic.  Neck: Neck supple. No tracheal deviation present. No thyromegaly present.  No cervical lymphadenopathy Cardiovascular: Normal rate, regular rhythm and normal heart sounds.   2/6 sys murmur heard. No carotid bruit .  No edema Pulmonary/Chest: Effort normal and breath sounds normal. No respiratory distress. No has no wheezes. No rales.  Skin: Skin is warm and dry. Not diaphoretic.  Psychiatric: Normal mood and affect. Behavior is normal.      Assessment & Plan:    See Problem List for Assessment and Plan of chronic medical problems.    This visit occurred during the SARS-CoV-2 public health emergency.  Safety protocols were in place, including screening questions prior to the visit, additional usage of staff PPE, and extensive cleaning of exam room while observing appropriate contact time as indicated for disinfecting solutions.

## 2020-09-13 ENCOUNTER — Other Ambulatory Visit: Payer: Self-pay

## 2020-09-13 ENCOUNTER — Ambulatory Visit (INDEPENDENT_AMBULATORY_CARE_PROVIDER_SITE_OTHER): Payer: Medicare HMO | Admitting: Internal Medicine

## 2020-09-13 ENCOUNTER — Encounter: Payer: Self-pay | Admitting: Internal Medicine

## 2020-09-13 VITALS — BP 136/62 | HR 66 | Temp 98.0°F | Ht 60.0 in | Wt 105.0 lb

## 2020-09-13 DIAGNOSIS — E119 Type 2 diabetes mellitus without complications: Secondary | ICD-10-CM | POA: Diagnosis not present

## 2020-09-13 DIAGNOSIS — E782 Mixed hyperlipidemia: Secondary | ICD-10-CM | POA: Diagnosis not present

## 2020-09-13 DIAGNOSIS — I700 Atherosclerosis of aorta: Secondary | ICD-10-CM | POA: Diagnosis not present

## 2020-09-13 DIAGNOSIS — K219 Gastro-esophageal reflux disease without esophagitis: Secondary | ICD-10-CM

## 2020-09-13 DIAGNOSIS — I10 Essential (primary) hypertension: Secondary | ICD-10-CM | POA: Diagnosis not present

## 2020-09-13 DIAGNOSIS — F419 Anxiety disorder, unspecified: Secondary | ICD-10-CM

## 2020-09-13 DIAGNOSIS — F3289 Other specified depressive episodes: Secondary | ICD-10-CM

## 2020-09-13 MED ORDER — HYDROCHLOROTHIAZIDE 12.5 MG PO TABS: 12.5000 mg | ORAL_TABLET | Freq: Every day | ORAL | 1 refills | Status: DC

## 2020-09-13 MED ORDER — ALPRAZOLAM 0.5 MG PO TABS: 0.5000 mg | ORAL_TABLET | Freq: Two times a day (BID) | ORAL | 5 refills | Status: DC | PRN

## 2020-09-13 MED ORDER — MIRTAZAPINE 15 MG PO TABS: 15.0000 mg | ORAL_TABLET | Freq: Every day | ORAL | 5 refills | Status: DC

## 2020-09-13 MED ORDER — OMEPRAZOLE 40 MG PO CPDR: 40.0000 mg | DELAYED_RELEASE_CAPSULE | Freq: Two times a day (BID) | ORAL | 1 refills | Status: DC

## 2020-09-13 MED ORDER — PRAVASTATIN SODIUM 40 MG PO TABS: 40.0000 mg | ORAL_TABLET | Freq: Every evening | ORAL | 1 refills | Status: DC

## 2020-09-13 NOTE — Assessment & Plan Note (Addendum)
Chronic Continue pravastatin 40 mg daily Having difficulty swallowing fenofibrate - will stop and re-evaluate in near future Regular exercise and healthy diet encouraged

## 2020-09-13 NOTE — Assessment & Plan Note (Signed)
Chronic Not controlled - more emotional coming off steroid  Continue remeron, but will increase to 15 mg q hs

## 2020-09-13 NOTE — Assessment & Plan Note (Addendum)
Chronic Not ideally controlled - more emotional with steroid - being tapered off now Continue remeron but increase to 15 mg qhs Continue xanax 0.5 mg bid prn

## 2020-09-13 NOTE — Assessment & Plan Note (Signed)
Chronic Continue pravastatin 40 mg daily Encouraged healthy diet and regular activity

## 2020-09-13 NOTE — Assessment & Plan Note (Addendum)
Chronic BP well controlled Continue hctz 12.5 mg

## 2020-09-13 NOTE — Assessment & Plan Note (Signed)
Chronic GERD controlled Continue omeprazole 40 mg BID

## 2020-09-13 NOTE — Patient Instructions (Addendum)
   Medications changes include :   Increase remeron to 15 mg nightly.  Stop fenofibrate.  Stop calcium supplementation.   Your prescription(s) have been submitted to your pharmacy. Please take as directed and contact our office if you believe you are having problem(s) with the medication(s).     Please followup in 6 months

## 2020-09-13 NOTE — Assessment & Plan Note (Addendum)
Chronic Diet controlled Lab Results  Component Value Date   HGBA1C 6.5 07/24/2020

## 2020-09-18 ENCOUNTER — Other Ambulatory Visit (HOSPITAL_COMMUNITY)
Admission: RE | Admit: 2020-09-18 | Discharge: 2020-09-18 | Disposition: A | Discharge status: Home / Self Care | Payer: Medicare HMO | Source: Ambulatory Visit | Attending: Thoracic Surgery (Cardiothoracic Vascular Surgery) | Admitting: Thoracic Surgery (Cardiothoracic Vascular Surgery)

## 2020-09-18 DIAGNOSIS — Z01812 Encounter for preprocedural laboratory examination: Secondary | ICD-10-CM | POA: Insufficient documentation

## 2020-09-18 DIAGNOSIS — Z20822 Contact with and (suspected) exposure to covid-19: Secondary | ICD-10-CM | POA: Insufficient documentation

## 2020-09-18 LAB — SARS CORONAVIRUS 2 (TAT 6-24 HRS): SARS Coronavirus 2: NEGATIVE

## 2020-09-19 ENCOUNTER — Ambulatory Visit: Payer: Medicare HMO | Admitting: Internal Medicine

## 2020-09-19 NOTE — Pre-Procedure Instructions (Signed)
Hannah [name]  09/19/2020    Your procedure is scheduled on Mon., September 24, 2020 from 7:30AM-11:26AM  Report to [facility] Entrance "A" at 5:30AM  Call this number if you have problems the morning of surgery:  [phone]   Remember:  Do not eat or drink after midnight on March 27th    Take these medicines the morning of surgery with A SIP OF WATER: Carboxymethylcellulose (REFRESH PLUS) Dexamethasone (DECADRON)  Omeprazole (PRILOSEC)  If Needed: HYDROcodone-acetaminophen (NORCO/VICODIN)  Loratadine (CLARITIN)   As of today, STOP taking all Aspirin (Unless instructed by your doctor) and Other Aspirin containing products, Vitamins, Fish oils, and Herbal medications. Also stop all NSAIDS i.e. Advil, Ibuprofen, Motrin, Aleve, Anaprox, Naproxen, BC, Goody Powders, and all Supplements.   No Smoking of any kind, Tobacco/Vaping, or Alcohol products 24 hours prior to your procedure. If you use a Cpap at night, you may bring all equipment for your overnight stay.    Day of Surgery:  Do not wear jewelry, make-up or nail polish.  Do not wear lotions, powders, or perfumes, or deodorant.  Do not shave 48 hours prior to surgery.    Do not bring valuables to the hospital.  [facility] is not responsible for any belongings or valuables.  Contacts, dentures or bridgework may not be worn into surgery.    For patients admitted to the hospital, discharge time will be determined by your treatment team.  Patients discharged the day of surgery will not be allowed to drive home, and someone age 72 and over needs to stay with them for 24 hours.   Special instructions:   [facility]- Preparing For Surgery  Before surgery, you can play an important role. Because skin is not sterile, your skin needs to be as free of germs as possible. You can reduce the number of germs on your skin by washing with CHG (chlorahexidine gluconate) Soap before surgery.  CHG is an antiseptic cleaner which  kills germs and bonds with the skin to continue killing germs even after washing.    Oral Hygiene is also important to reduce your risk of infection.  Remember - BRUSH YOUR TEETH THE MORNING OF SURGERY WITH YOUR REGULAR TOOTHPASTE  Please do not use if you have an allergy to CHG or antibacterial soaps. If your skin becomes reddened/irritated stop using the CHG.  Do not shave (including legs and underarms) for at least 48 hours prior to first CHG shower. It is OK to shave your face.  Please follow these instructions carefully.   1. Shower the NIGHT BEFORE SURGERY and the MORNING OF SURGERY with CHG.   2. If you chose to wash your hair, wash your hair first as usual with your normal shampoo.  3. After you shampoo, rinse your hair and body thoroughly to remove the shampoo.  4. Use CHG as you would any other liquid soap. You can apply CHG directly to the skin and wash gently with a scrungie or a clean washcloth.   5. Apply the CHG Soap to your body ONLY FROM THE NECK DOWN.  Do not use on open wounds or open sores. Avoid contact with your eyes, ears, mouth and genitals (private parts). Wash Face and genitals (private parts)  with your normal soap.  6. Wash thoroughly, paying special attention to the area where your surgery will be performed.  7. Thoroughly rinse your body with warm water from the neck down.  8. DO NOT shower/wash with your normal  soap after using and rinsing off the CHG Soap.  9. Pat yourself dry with a CLEAN TOWEL.  10. Wear CLEAN PAJAMAS to bed the night before surgery, wear comfortable clothes the morning of surgery  11. Place CLEAN SHEETS on your bed the night of your first shower and DO NOT SLEEP WITH PETS.  Reminders: Do not apply any deodorants/lotions.  Please wear clean clothes to the hospital/surgery center.   Remember to brush your teeth WITH YOUR REGULAR TOOTHPASTE.  Please read over the following fact sheets that you were given.

## 2020-09-20 ENCOUNTER — Ambulatory Visit (HOSPITAL_COMMUNITY)
Admission: RE | Admit: 2020-09-20 | Discharge: 2020-09-20 | Disposition: A | Discharge status: Home / Self Care | Payer: Medicare HMO | Source: Ambulatory Visit | Attending: Thoracic Surgery (Cardiothoracic Vascular Surgery) | Admitting: Thoracic Surgery (Cardiothoracic Vascular Surgery)

## 2020-09-20 ENCOUNTER — Inpatient Hospital Stay: Payer: Medicare HMO | Admitting: Internal Medicine

## 2020-09-20 ENCOUNTER — Encounter (HOSPITAL_COMMUNITY): Payer: Self-pay

## 2020-09-20 ENCOUNTER — Other Ambulatory Visit (HOSPITAL_COMMUNITY): Payer: Medicare HMO

## 2020-09-20 ENCOUNTER — Encounter (HOSPITAL_COMMUNITY)
Admission: RE | Admit: 2020-09-20 | Discharge: 2020-09-20 | Disposition: A | Discharge status: Home / Self Care | Payer: Medicare HMO | Source: Ambulatory Visit | Attending: Thoracic Surgery (Cardiothoracic Vascular Surgery) | Admitting: Thoracic Surgery (Cardiothoracic Vascular Surgery)

## 2020-09-20 ENCOUNTER — Other Ambulatory Visit: Payer: Self-pay

## 2020-09-20 VITALS — BP 124/67 | HR 77 | Temp 97.8°F | Resp 14 | Ht 60.0 in | Wt 109.1 lb

## 2020-09-20 DIAGNOSIS — I10 Essential (primary) hypertension: Secondary | ICD-10-CM | POA: Insufficient documentation

## 2020-09-20 DIAGNOSIS — C3411 Malignant neoplasm of upper lobe, right bronchus or lung: Secondary | ICD-10-CM

## 2020-09-20 DIAGNOSIS — R918 Other nonspecific abnormal finding of lung field: Secondary | ICD-10-CM

## 2020-09-20 DIAGNOSIS — Z20822 Contact with and (suspected) exposure to covid-19: Secondary | ICD-10-CM | POA: Diagnosis not present

## 2020-09-20 DIAGNOSIS — C3490 Malignant neoplasm of unspecified part of unspecified bronchus or lung: Secondary | ICD-10-CM | POA: Insufficient documentation

## 2020-09-20 DIAGNOSIS — R7303 Prediabetes: Secondary | ICD-10-CM | POA: Insufficient documentation

## 2020-09-20 DIAGNOSIS — F419 Anxiety disorder, unspecified: Secondary | ICD-10-CM | POA: Diagnosis not present

## 2020-09-20 DIAGNOSIS — I700 Atherosclerosis of aorta: Secondary | ICD-10-CM | POA: Diagnosis not present

## 2020-09-20 DIAGNOSIS — Z96651 Presence of right artificial knee joint: Secondary | ICD-10-CM | POA: Insufficient documentation

## 2020-09-20 DIAGNOSIS — D649 Anemia, unspecified: Secondary | ICD-10-CM | POA: Insufficient documentation

## 2020-09-20 DIAGNOSIS — E785 Hyperlipidemia, unspecified: Secondary | ICD-10-CM | POA: Insufficient documentation

## 2020-09-20 DIAGNOSIS — K219 Gastro-esophageal reflux disease without esophagitis: Secondary | ICD-10-CM | POA: Diagnosis not present

## 2020-09-20 DIAGNOSIS — C7931 Secondary malignant neoplasm of brain: Secondary | ICD-10-CM | POA: Diagnosis not present

## 2020-09-20 DIAGNOSIS — Z7982 Long term (current) use of aspirin: Secondary | ICD-10-CM | POA: Insufficient documentation

## 2020-09-20 DIAGNOSIS — Z888 Allergy status to other drugs, medicaments and biological substances status: Secondary | ICD-10-CM | POA: Diagnosis not present

## 2020-09-20 DIAGNOSIS — Z79899 Other long term (current) drug therapy: Secondary | ICD-10-CM | POA: Insufficient documentation

## 2020-09-20 DIAGNOSIS — Z87891 Personal history of nicotine dependence: Secondary | ICD-10-CM | POA: Diagnosis not present

## 2020-09-20 DIAGNOSIS — I351 Nonrheumatic aortic (valve) insufficiency: Secondary | ICD-10-CM | POA: Insufficient documentation

## 2020-09-20 DIAGNOSIS — I779 Disorder of arteries and arterioles, unspecified: Secondary | ICD-10-CM | POA: Diagnosis not present

## 2020-09-20 DIAGNOSIS — I4510 Unspecified right bundle-branch block: Secondary | ICD-10-CM | POA: Insufficient documentation

## 2020-09-20 DIAGNOSIS — S0003XA Contusion of scalp, initial encounter: Secondary | ICD-10-CM | POA: Diagnosis not present

## 2020-09-20 DIAGNOSIS — X58XXXA Exposure to other specified factors, initial encounter: Secondary | ICD-10-CM | POA: Diagnosis not present

## 2020-09-20 DIAGNOSIS — Z01818 Encounter for other preprocedural examination: Secondary | ICD-10-CM | POA: Diagnosis not present

## 2020-09-20 DIAGNOSIS — F32A Depression, unspecified: Secondary | ICD-10-CM | POA: Diagnosis not present

## 2020-09-20 DIAGNOSIS — Z8719 Personal history of other diseases of the digestive system: Secondary | ICD-10-CM | POA: Diagnosis not present

## 2020-09-20 LAB — URINALYSIS, ROUTINE W REFLEX MICROSCOPIC
Bilirubin Urine: NEGATIVE
Glucose, UA: NEGATIVE mg/dL
Hgb urine dipstick: NEGATIVE
Ketones, ur: NEGATIVE mg/dL
Leukocytes,Ua: NEGATIVE
Nitrite: NEGATIVE
Protein, ur: NEGATIVE mg/dL
Specific Gravity, Urine: 1.017 (ref 1.005–1.030)
pH: 6 (ref 5.0–8.0)

## 2020-09-20 LAB — PULMONARY FUNCTION TEST
DL/VA % pred: 91 %
DL/VA: 3.91 ml/min/mmHg/L
DLCO cor % pred: 92 %
DLCO cor: 15.56 ml/min/mmHg
DLCO unc % pred: 87 %
DLCO unc: 14.63 ml/min/mmHg
FEF 25-75 Post: 2.15 L/sec
FEF 25-75 Pre: 1.41 L/sec
FEF2575-%Change-Post: 52 %
FEF2575-%Pred-Post: 160 %
FEF2575-%Pred-Pre: 105 %
FEV1-%Change-Post: 12 %
FEV1-%Pred-Post: 145 %
FEV1-%Pred-Pre: 129 %
FEV1-Post: 2.1 L
FEV1-Pre: 1.87 L
FEV1FVC-%Change-Post: 9 %
FEV1FVC-%Pred-Pre: 95 %
FEV6-%Change-Post: 2 %
FEV6-%Pred-Post: 146 %
FEV6-%Pred-Pre: 143 %
FEV6-Post: 2.6 L
FEV6-Pre: 2.55 L
FEV6FVC-%Pred-Post: 105 %
FEV6FVC-%Pred-Pre: 105 %
FVC-%Change-Post: 2 %
FVC-%Pred-Post: 139 %
FVC-%Pred-Pre: 136 %
FVC-Post: 2.6 L
FVC-Pre: 2.55 L
Post FEV1/FVC ratio: 80 %
Post FEV6/FVC ratio: 100 %
Pre FEV1/FVC ratio: 73 %
Pre FEV6/FVC Ratio: 100 %
RV % pred: 149 %
RV: 3.04 L
TLC % pred: 125 %
TLC: 5.6 L

## 2020-09-20 LAB — SURGICAL PCR SCREEN
MRSA, PCR: NEGATIVE
Staphylococcus aureus: NEGATIVE

## 2020-09-20 LAB — COMPREHENSIVE METABOLIC PANEL
ALT: 15 U/L (ref 0–44)
AST: 21 U/L (ref 15–41)
Albumin: 3.7 g/dL (ref 3.5–5.0)
Alkaline Phosphatase: 44 U/L (ref 38–126)
Anion gap: 11 (ref 5–15)
BUN: 13 mg/dL (ref 8–23)
CO2: 24 mmol/L (ref 22–32)
Calcium: 9.9 mg/dL (ref 8.9–10.3)
Chloride: 103 mmol/L (ref 98–111)
Creatinine, Ser: 0.7 mg/dL (ref 0.44–1.00)
GFR, Estimated: >60 mL/min (ref >60)
Glucose, Bld: 98 mg/dL (ref 70–99)
Potassium: 3.7 mmol/L (ref 3.5–5.1)
Sodium: 138 mmol/L (ref 135–145)
Total Bilirubin: 0.3 mg/dL (ref 0.3–1.2)
Total Protein: 7 g/dL (ref 6.5–8.1)

## 2020-09-20 LAB — BLOOD GAS, ARTERIAL
Acid-Base Excess: 1.1 mmol/L (ref 0.0–2.0)
Bicarbonate: 24.9 mmol/L (ref 20.0–28.0)
Drawn by: 58793
FIO2: 21
O2 Saturation: 98.4 %
Patient temperature: 37
pCO2 arterial: 38 mmHg (ref 32.0–48.0)
pH, Arterial: 7.432 (ref 7.350–7.450)
pO2, Arterial: 108 mmHg (ref 83.0–108.0)

## 2020-09-20 LAB — CBC
HCT: 37.5 % (ref 36.0–46.0)
Hemoglobin: 11.6 g/dL — ABNORMAL LOW (ref 12.0–15.0)
MCH: 26.5 pg (ref 26.0–34.0)
MCHC: 30.9 g/dL (ref 30.0–36.0)
MCV: 85.6 fL (ref 80.0–100.0)
Platelets: 285 10*3/uL (ref 150–400)
RBC: 4.38 MIL/uL (ref 3.87–5.11)
RDW: 13.2 % (ref 11.5–15.5)
WBC: 7.4 10*3/uL (ref 4.0–10.5)
nRBC: 0 % (ref 0.0–0.2)

## 2020-09-20 LAB — APTT: aPTT: 35 seconds (ref 24–36)

## 2020-09-20 LAB — PROTIME-INR
INR: 1 (ref 0.8–1.2)
Prothrombin Time: 12.4 seconds (ref 11.4–15.2)

## 2020-09-20 LAB — SARS CORONAVIRUS 2 (TAT 6-24 HRS): SARS Coronavirus 2: NEGATIVE

## 2020-09-20 MED ORDER — ALBUTEROL SULFATE (2.5 MG/3ML) 0.083% IN NEBU
2.5000 mg | INHALATION_SOLUTION | Freq: Once | RESPIRATORY_TRACT | Status: AC
  Administered 2020-09-20: 2.5 mg via RESPIRATORY_TRACT

## 2020-09-20 NOTE — Progress Notes (Signed)
Hannah Hannah [name] [phone]   [phone]  OFFICE PROGRESS NOTE  [name], MD [place] [address]  DIAGNOSIS: Stage IV non-small cell lung cancer (T, N0, M1C) adenocarcinoma.  The patient presented with a and solitary brain metastasis.  The patient was diagnosed in February 2022.  PRIOR THERAPY: 1) SRS to the metastatic brain lesion on 08/23/2020 under the care of Dr. Tammi Klippel and craniotomy under the care of Dr. Zada Finders scheduled for 08/24/2020.  CURRENT THERAPY: Expected right upper lobectomy with lymph node dissection on September 24, 2020.   INTERVAL HISTORY: Hannah Hannah [name] 70 y.o. female returns to the clinic today for follow-up visit accompanied by her daughter.  The patient recovered from her recent brain surgery fairly well.  She was seen by Dr. Roxan Hockey and expected to have right upper lobectomy on September 24, 2020.  She denied having any current chest pain, shortness of breath, cough or hemoptysis.  She denied having any fever or chills.  She has no nausea, vomiting, diarrhea or constipation.  She has no headache or visual changes.  She is here today for evaluation and recommendation regarding her condition.  MEDICAL HISTORY: Past Medical History:  Diagnosis Hannah [name]  . Allergy   . Anemia   . Anxiety   . Arthritis   . Carpal tunnel syndrome   . Constipation    senna C stool softeners help   . Depression   . Diverticulosis   . Dyslipidemia   . External hemorrhoids   . GERD (gastroesophageal reflux disease)   . Heart murmur    mild-moderate AR  . Hiatal hernia   . Hyperlipidemia    on meds   . Hypertension   . Internal hemorrhoids   . Osteoarthritis   . Pre-diabetes   . PVD (peripheral vascular disease) (HCC)    moderate carotid disease  . RBBB   . Smoker   . Vocal cord polyps     ALLERGIES:  is allergic to amlodipine, chantix [varenicline tartrate], clarithromycin, lisinopril, simvastatin, wellbutrin  [bupropion hcl], lipitor [atorvastatin], and sertraline.  MEDICATIONS:  Current Outpatient Medications  Medication Sig Dispense Refill  . ALPRAZolam (XANAX) 0.5 MG tablet Take 1 tablet (0.5 mg total) by mouth 2 (two) times daily as needed. for sleep 60 tablet 5  . Ascorbic Acid (VITAMIN C) 1000 MG tablet Take 1,000 mg by mouth daily.    Marland Kitchen aspirin 81 MG tablet Take 1 tablet (81 mg total) by mouth daily. Restart on 08/31/20 30 tablet   . carboxymethylcellulose (REFRESH PLUS) 0.5 % SOLN Place 1-2 drops into both eyes daily.    . hydrochlorothiazide (HYDRODIURIL) 12.5 MG tablet Take 1 tablet (12.5 mg total) by mouth daily. 90 tablet 1  . loratadine (CLARITIN) 10 MG tablet Take 10 mg by mouth daily as needed for allergies.    . mirtazapine (REMERON) 15 MG tablet Take 1 tablet (15 mg total) by mouth at bedtime. 30 tablet 5  . neomycin-bacitracin-polymyxin (NEOSPORIN) ointment Apply 1 application topically as needed for wound care.    Marland Kitchen omeprazole (PRILOSEC) 40 MG capsule Take 1 capsule (40 mg total) by mouth 2 (two) times daily before a meal. 180 capsule 1  . polyethylene glycol (MIRALAX / GLYCOLAX) 17 g packet Take 17 g by mouth 2 (two) times daily. 72 each 0  . pravastatin (PRAVACHOL) 40 MG tablet Take 1 tablet (40 mg total) by mouth every evening. 90 tablet 1  . Probiotic Product (  PROBIOTIC DAILY PO) Take 1 capsule by mouth daily.    Marland Kitchen senna-docusate (SENOKOT-S) 8.6-50 MG tablet Take 1 tablet by mouth 2 (two) times daily. (Patient taking differently: Take 1 tablet by mouth daily as needed for mild constipation.)    . Wheat Dextrin (BENEFIBER) POWD Take 1 Scoop by mouth 2 (two) times daily as needed (constipation).     No current facility-administered medications for this visit.    SURGICAL HISTORY:  Past Surgical History:  Procedure Laterality Hannah [name]  . ABDOMINAL HYSTERECTOMY  1994  . APPLICATION OF CRANIAL NAVIGATION N/A 08/24/2020   Procedure: APPLICATION OF CRANIAL NAVIGATION;  Surgeon:  Judith Part, MD;  Location: [facility];  Service: Neurosurgery;  Laterality: N/A;  . COLONOSCOPY    . COLONOSCOPY W/ POLYPECTOMY  2009  . CRANIOTOMY Left 08/24/2020   Procedure: Left Craniotomy for Tumor Resection with Brainlab;  Surgeon: Judith Part, MD;  Location: [facility];  Service: Neurosurgery;  Laterality: Left;  . HEMORRHOID SURGERY    . JOINT REPLACEMENT    . POLYPECTOMY    . right total knee arthroplasty     Dr. Emeterio Reeve 06-04-18  . SPINE SURGERY  08/16/2009  . TOTAL KNEE ARTHROPLASTY Left 02/13/2014   Procedure: TOTAL KNEE ARTHROPLASTY;  Surgeon: Alta Corning, MD;  Location: [facility];  Service: Orthopedics;  Laterality: Left;  . TOTAL KNEE ARTHROPLASTY Right 06/04/2018   Procedure: RIGHT TOTAL KNEE ARTHROPLASTY;  Surgeon: Dorna Leitz, MD;  Location: [facility];  Service: Orthopedics;  Laterality: Right;  Adductor Block  . TUBAL LIGATION      REVIEW OF SYSTEMS:  A comprehensive review of systems was negative except for: Behavioral/Psych: positive for depression   PHYSICAL EXAMINATION: General appearance: alert, cooperative, fatigued and no distress Head: Normocephalic, without obvious abnormality, atraumatic Neck: no adenopathy, no JVD, supple, symmetrical, trachea midline and thyroid not enlarged, symmetric, no tenderness/mass/nodules Lymph nodes: Cervical, supraclavicular, and axillary nodes normal. Resp: clear to auscultation bilaterally Back: symmetric, no curvature. ROM normal. No CVA tenderness. Cardio: regular rate and rhythm, S1, S2 normal, no murmur, click, rub or gallop GI: soft, non-tender; bowel sounds normal; no masses,  no organomegaly Extremities: extremities normal, atraumatic, no cyanosis or edema  ECOG PERFORMANCE STATUS: 1 - Symptomatic but completely ambulatory  Blood pressure 124/67, pulse 77, temperature 97.8 F (36.6 C), temperature source Tympanic, resp. rate 14, height 5' (1.524 m), weight 109 lb 1.6 oz (49.5 kg), SpO2 100 %.  LABORATORY DATA: Lab  Results  Component Value Hannah [name]   WBC 6.1 08/24/2020   HGB 9.8 (L) 08/24/2020   HCT 30.8 (L) 08/24/2020   MCV 84.8 08/24/2020   PLT 193 08/24/2020      Chemistry      Component Value Hannah [name]/Time   NA 139 08/19/2020 0321   K 4.8 08/19/2020 0321   CL 106 08/19/2020 0321   CO2 27 08/19/2020 0321   BUN 9 08/19/2020 0321   CREATININE 0.76 08/24/2020 1453   CREATININE 0.92 08/07/2020 0856   CREATININE 0.79 03/14/2020 1016      Component Value Hannah [name]/Time   CALCIUM 9.3 08/19/2020 0321   ALKPHOS 44 08/17/2020 1808   AST 23 08/17/2020 1808   AST 17 08/07/2020 0856   ALT 15 08/17/2020 1808   ALT 11 08/07/2020 0856   BILITOT 0.4 08/17/2020 1808   BILITOT 0.3 08/07/2020 0856       RADIOGRAPHIC STUDIES: MR BRAIN W WO CONTRAST  Result Hannah [name]: 08/25/2020 CLINICAL DATA:  70 year old female with left frontal lobe  enhancing mass. Postoperative day 1 status post left frontal craniotomy and tumor resection. EXAM: MRI HEAD WITHOUT AND WITH CONTRAST TECHNIQUE: Multiplanar, multiecho pulse sequences of the brain and surrounding structures were obtained without and with intravenous contrast. CONTRAST:  4.70mL GADAVIST GADOBUTROL 1 MMOL/ML IV SOLN COMPARISON:  Preoperative MRI 08/15/2020 and earlier. FINDINGS: Brain: Interval left superior craniotomy. Gross total resection of rim enhancing mass from the left superior frontal gyrus (series 10, image 37 today versus series 12, image 127 on 08/15/2020. Blood products in the surgical cavity. Note that there is a small volume of diffusion restriction located primarily along the posterior and inferior surgical cavity (series 2, image 38) and is somewhat nodular in configuration. Mild overlying dural thickening which is likely postoperative, and is associated with trace extra-axial collection underlying the craniotomy. Regional confluent T2 and FLAIR hyperintense vasogenic edema has slightly regressed. Regional mass effect is mildly improved. No other restricted  diffusion. No other abnormal intracranial enhancement. No midline shift, ventriculomegaly, or other intracranial hemorrhage. Cervicomedullary junction and pituitary are within normal limits. Stable gray and white matter signal elsewhere. Vascular: Major intracranial vascular flow voids are stable. The major dural venous sinuses are enhancing and appear to be patent. Skull and upper cervical spine: Expected appearance of craniotomy. Background bone marrow signal remains within normal limits. Negative for age visible cervical spine. Sinuses/Orbits: Stable, negative. Other: Mastoids remain clear. Broad-based postoperative vertex scalp hematoma. IMPRESSION: 1. Gross total resection of the left superior frontal gyrus mass. Note small volume, slightly nodular ischemia along the posterior and inferior resection margin which will likely enhance on follow-up. Mildly improved vasogenic edema and regional mass effect. 2. No new intracranial abnormality. Electronically Signed   By: Genevie Ann M.D.   On: 08/25/2020 06:20   ECHOCARDIOGRAM COMPLETE  Result Hannah [name]: 09/06/2020    ECHOCARDIOGRAM REPORT   Patient Name:   Hannah Hannah [name] Hannah [name] of Exam: 09/06/2020 Medical Rec #:  [id number]       Height:       60.0 in Accession #:    [id number]      Weight:       103.8 lb Hannah [name] of Birth:  [birth year]        BSA:          1.413 m Patient Age:    21 years        BP:           122/72 mmHg Patient Gender: F               HR:           74 bpm. Exam Location:  Outpatient Procedure: 2D Echo, Cardiac Doppler and Color Doppler Indications:    Aortic valve disorder  History:        Patient has prior history of Echocardiogram examinations, most                 recent 11/29/2013. Aortic Valve Disease, Arrythmias:RBBB; Risk                 Factors:Hypertension, Former Smoker and Dyslipidemia.  Sonographer:    Clayton Lefort RDCS (AE) Referring Phys: [place]  1. Left ventricular ejection fraction, by estimation, is 65 to 70%. The  left ventricle has normal function. The left ventricle has no regional wall motion abnormalities. There is severe left ventricular hypertrophy. Left ventricular diastolic parameters  are consistent with Grade I diastolic dysfunction (impaired relaxation).  2. Right ventricular systolic function is normal.  The right ventricular size is normal. There is normal pulmonary artery systolic pressure. The estimated right ventricular systolic pressure is 14.4 mmHg.  3. The mitral valve is abnormal. No evidence of mitral valve regurgitation. Moderate mitral annular calcification.  4. The aortic valve is tricuspid. Aortic valve regurgitation is mild to moderate. Mild to moderate aortic valve sclerosis/calcification is present, without any evidence of aortic stenosis.  5. The inferior vena cava is normal in size with greater than 50% respiratory variability, suggesting right atrial pressure of 3 mmHg. FINDINGS  Left Ventricle: Left ventricular ejection fraction, by estimation, is 65 to 70%. The left ventricle has normal function. The left ventricle has no regional wall motion abnormalities. The left ventricular internal cavity size was small. There is severe left ventricular hypertrophy. Left ventricular diastolic parameters are consistent with Grade I diastolic dysfunction (impaired relaxation). Right Ventricle: The right ventricular size is normal. Right vetricular wall thickness was not well visualized. Right ventricular systolic function is normal. There is normal pulmonary artery systolic pressure. The tricuspid regurgitant velocity is 2.48 m/s, and with an assumed right atrial pressure of 3 mmHg, the estimated right ventricular systolic pressure is 81.8 mmHg. Left Atrium: Left atrial size was normal in size. Right Atrium: Right atrial size was normal in size. Pericardium: Trivial pericardial effusion is present. Mitral Valve: The mitral valve is abnormal. Moderate mitral annular calcification. No evidence of mitral valve  regurgitation. Tricuspid Valve: The tricuspid valve is normal in structure. Tricuspid valve regurgitation is mild. Aortic Valve: The aortic valve is tricuspid. Aortic valve regurgitation is mild to moderate. Aortic regurgitation PHT measures 557 msec. Mild to moderate aortic valve sclerosis/calcification is present, without any evidence of aortic stenosis. Aortic valve mean gradient measures 4.0 mmHg. Aortic valve peak gradient measures 8.1 mmHg. Aortic valve area, by VTI measures 2.33 cm. Pulmonic Valve: The pulmonic valve was not well visualized. Pulmonic valve regurgitation is trivial. Aorta: The aortic root is normal in size and structure. Venous: The inferior vena cava is normal in size with greater than 50% respiratory variability, suggesting right atrial pressure of 3 mmHg. IAS/Shunts: No atrial level shunt detected by color flow Doppler.  LEFT VENTRICLE PLAX 2D LVIDd:         3.40 cm  Diastology LVIDs:         2.20 cm  LV e' medial:    4.35 cm/s LV PW:         1.60 cm  LV E/e' medial:  10.7 LV IVS:        1.60 cm  LV e' lateral:   3.83 cm/s LVOT diam:     1.90 cm  LV E/e' lateral: 12.2 LV SV:         64 LV SV Index:   45 LVOT Area:     2.84 cm  RIGHT VENTRICLE            IVC RV Basal diam:  3.00 cm    IVC diam: 1.00 cm RV S prime:     9.98 cm/s TAPSE (M-mode): 2.6 cm LEFT ATRIUM             Index       RIGHT ATRIUM          Index LA diam:        3.00 cm 2.12 cm/m  RA Area:     9.96 cm LA Vol (A2C):   22.7 ml 16.07 ml/m RA Volume:   20.40 ml 14.44 ml/m LA Vol (A4C):  26.7 ml 18.90 ml/m LA Biplane Vol: 27.1 ml 19.19 ml/m  AORTIC VALVE AV Area (Vmax):    2.46 cm AV Area (Vmean):   2.23 cm AV Area (VTI):     2.33 cm AV Vmax:           142.00 cm/s AV Vmean:          86.600 cm/s AV VTI:            0.273 m AV Peak Grad:      8.1 mmHg AV Mean Grad:      4.0 mmHg LVOT Vmax:         123.00 cm/s LVOT Vmean:        68.000 cm/s LVOT VTI:          0.224 m LVOT/AV VTI ratio: 0.82 AI PHT:            557 msec   AORTA Ao Root diam: 3.30 cm MITRAL VALVE               TRICUSPID VALVE MV Area (PHT): 3.65 cm    TR Peak grad:   24.6 mmHg MV Decel Time: 208 msec    TR Vmax:        248.00 cm/s MV E velocity: 46.70 cm/s MV A velocity: 81.40 cm/s  SHUNTS MV E/A ratio:  0.57        Systemic VTI:  0.22 m                            Systemic Diam: 1.90 cm Oswaldo Milian MD Electronically signed by Oswaldo Milian MD Signature Hannah [name]/Time: 09/06/2020/9:30:17 PM    Final     ASSESSMENT AND PLAN: This is a very pleasant 70 years old African-American female diagnosed with a stage IV (T3, N0, M1 C) non-small cell lung cancer, adenocarcinoma presented with right upper lobe lung mass with solitary brain metastasis diagnosed in February 2022 status post SRS to the brain lesion followed by craniotomy and resection. The patient also has a solitary lung mass. I recommended for her to proceed with the surgical resection as recommended by Dr. Roxan Hockey. I will see the patient back for follow-up visit in 1 months for evaluation and discussion of systemic treatment option after the surgery. The patient was advised to continue with her current antidepression medication with Remeron 15 mg p.o. nightly. She was also advised to call immediately if she has any concerning symptoms in the interval. The patient voices understanding of current disease status and treatment options and is in agreement with the current care plan.  All questions were answered. The patient knows to call the clinic with any problems, questions or concerns. We can certainly see the patient much sooner if necessary.  The total time spent in the appointment was 20 minutes.  Disclaimer: This note was dictated with voice recognition software. Similar sounding words can inadvertently be transcribed and may not be corrected upon review.

## 2020-09-20 NOTE — Progress Notes (Signed)
PCP - Billey Gosling Cardiologist - Dr. Percival Spanish  PPM/ICD - n/a Device Orders -  Rep Notified -   Chest x-ray - DOS EKG - 09/20/20 Stress Test - patient denies ECHO - 09/06/20 Cardiac Cath - patient denies  Sleep Study - patient denies, negative stop bang CPAP -   Fasting Blood Sugar - n/a Checks Blood Sugar _____ times a day  Blood Thinner Instructions: n/a Aspirin Instructions: LD 09/19/20  ERAS Protcol - npo p mn PRE-SURGERY Ensure or G2-   COVID TEST- at PAT 09/20/20   Anesthesia review: yes, heart history; recent echo  Patient denies shortness of breath, fever, cough and chest pain at PAT appointment   All instructions explained to the patient, with a verbal understanding of the material. Patient agrees to go over the instructions while at home for a better understanding. Patient also instructed to self quarantine after being tested for COVID-19. The opportunity to ask questions was provided.

## 2020-09-21 ENCOUNTER — Encounter (HOSPITAL_COMMUNITY): Payer: Self-pay | Admitting: Thoracic Surgery (Cardiothoracic Vascular Surgery)

## 2020-09-21 NOTE — Progress Notes (Signed)
Anesthesia Chart Review:  Case: [id number] Date/Time: 09/24/20 0715   Procedure: XI ROBOTIC ASSISTED THORASCOPY-RIGHT UPPER LOBECTOMY (Right Chest)   Anesthesia type: General   Pre-op diagnosis: RUL MASS   Location: [facility] OR ROOM 10 / [place] [facility]   Surgeons: Melrose Nakayama, MD      DISCUSSION: Patient is a 70 year old female scheduled for the above procedure.  History includes former smoker (quitu 07/09/20), HTN, murmur/AI (mild-moderate 09/06/20), RBBB, carotid artery disease, dyslipidemia, pre-diabetes, hiatal hernia, GERD, vocal cord polyps, anemia, TKA (left 02/13/14, right 06/04/18), stage IV non-small cell lung cancer (with brain mets, s/p SRS 08/23/20, left frontal craniotomy for tumor resection 08/24/20).   Follows with cardiologist Dr. Percival Spanish for history of carotid disease and AI (mild to moderate 2015, 09/06/20). Last seen 05/02/2019, doing well from cardiac standpoint.  Last aspirin 09/19/2020.  Presurgical COVID-19 test negative on 09/20/2020.  Anesthesia team to evaluate on the day of surgery.   VS: BP 112/78   Pulse 81   Temp 37.1 C (Oral)   Resp 18   Ht 5' (1.524 m)   Wt 49.2 kg   SpO2 100%   BMI 21.19 kg/m     PROVIDERS: [name], MD is PCP  Minus Breeding, MD is cardiologist Curt Bears, MD is HEM-[facility] Tyler Pita, MD is RAD-ON[facility] Cecil Cobbs, MD is Gavin Potters, MD is neurosurgeon Zenovia Jarred, MD is GI   LABS: Labs reviewed: Acceptable for surgery. (all labs ordered are listed, but only abnormal results are displayed)  Labs Reviewed  CBC - Abnormal; Notable for the following components:      Result Value   Hemoglobin 11.6 (*)    All other components within normal limits  SURGICAL PCR SCREEN  SARS CORONAVIRUS 2 (TAT 6-24 HRS)  COMPREHENSIVE METABOLIC PANEL  BLOOD GAS, ARTERIAL  PROTIME-INR  APTT  URINALYSIS, ROUTINE W REFLEX MICROSCOPIC  TYPE AND SCREEN    PFTs 09/20/20: FVC 2.55 (136%), post 2.60 (139%). FEV1 1.87  (129%), post 2.10 (145%). DLCO unc 14.63 (87%), cor 15.56 (92%).   IMAGES: For CXR on the day of surgery.   MRI Brain 08/25/20: IMPRESSION: 1. Gross total resection of the left superior frontal gyrus mass. Note small volume, slightly nodular ischemia along the posterior and inferior resection margin which will likely enhance on follow-up. Mildly improved vasogenic edema and regional mass effect. 2. No new intracranial abnormality.  PET Scan 08/08/20: IMPRESSION: 1. The previously demonstrated large right upper lobe mass is peripherally hypermetabolic, consistent with bronchogenic carcinoma. This lesion demonstrates progressive central necrosis. 2. No evidence of metastatic disease. By imaging, this corresponds with stage II B disease (T3 N0 M0). 3. Nonspecific asymmetric activity associated with the arytenoid cartilage, potentially physiologic. Correlate with direct visualization if there are symptoms referable to this region. 4. Nonspecific anorectal activity, suspected to be related to a degree of rectal prolapse. Correlate clinically. 5. Incidental findings as previously described, including cholelithiasis and Aortic Atherosclerosis (ICD10-I70.0).  CT chest/abdomen/pelvis 08/02/20: IMPRESSION: 1. Irregular solid 5.4 cm peripheral right upper lobe lung mass abutting and mildly distorting the minor fissure, compatible with primary bronchogenic carcinoma. Multi disciplinary thoracic oncology consultation suggested. 2. Tiny 0.2 cm lingular pulmonary nodule, for which follow-up chest CT is recommended in 3 months. 3. No additional potential findings of metastatic disease in the chest, abdomen or pelvis. No thoracic adenopathy. 4. Two-vessel coronary atherosclerosis. 5. Cholelithiasis. 6. Ectatic 2.6 cm infrarenal abdominal aorta. Recommend follow-up ultrasound every 5 years. This recommendation follows ACR consensus guidelines: White  Paper of the ACR Incidental Findings Committee  II on Vascular Findings. J Am Coll Radiol 2013; 10:789-794. 7. Aortic Atherosclerosis (ICD10-I70.0) and Emphysema (ICD10-J43.9).   EKG: 09/20/20: Normal sinus rhythm Possible Left atrial enlargement Incomplete right bundle branch block Left axis deviation Left ventricular hypertrophy with QRS widening ( R in aVL , Cornell product ) Cannot rule out Septal infarct , age undetermined Abnormal ECG Confirmed by Quay Burow [phone]) on 09/20/2020 5:43:39 PM   CV: Echo 09/06/2020: IMPRESSIONS  1. Left ventricular ejection fraction, by estimation, is 65 to 70%. The  left ventricle has normal function. The left ventricle has no regional  wall motion abnormalities. There is severe left ventricular hypertrophy.  Left ventricular diastolic parameters  are consistent with Grade I diastolic dysfunction (impaired relaxation).  2. Right ventricular systolic function is normal. The right ventricular  size is normal. There is normal pulmonary artery systolic pressure. The  estimated right ventricular systolic pressure is 01.0 mmHg.  3. The mitral valve is abnormal. No evidence of mitral valve  regurgitation. Moderate mitral annular calcification.  4. The aortic valve is tricuspid. Aortic valve regurgitation is mild to  moderate. Mild to moderate aortic valve sclerosis/calcification is  present, without any evidence of aortic stenosis.  5. The inferior vena cava is normal in size with greater than 50%  respiratory variability, suggesting right atrial pressure of 3 mmHg.    Carotid duplex 03/16/2020: Summary:  Right Carotid: Velocities in the right ICA are consistent with a 40-59% stenosis.  Left Carotid: Velocities in the left ICA are consistent with a 60-79% stenosis.  Vertebrals: Bilateral vertebral arteries demonstrate antegrade flow.  Subclavians: Bilateral subclavian artery flow was disturbed.     Past Medical History:  Diagnosis Date  . Allergy   . Anemia   . Anxiety   .  Arthritis   . Carpal tunnel syndrome   . Constipation    senna C stool softeners help   . Depression   . Diverticulosis   . Dyslipidemia   . External hemorrhoids   . GERD (gastroesophageal reflux disease)   . Heart murmur    mild-moderate AR  . Hiatal hernia   . Hyperlipidemia    on meds   . Hypertension   . Internal hemorrhoids   . Osteoarthritis   . Pre-diabetes   . PVD (peripheral vascular disease) (HCC)    moderate carotid disease  . RBBB   . Smoker   . Vocal cord polyps     Past Surgical History:  Procedure Laterality Date  . ABDOMINAL HYSTERECTOMY  1994  . APPLICATION OF CRANIAL NAVIGATION N/A 08/24/2020   Procedure: APPLICATION OF CRANIAL NAVIGATION;  Surgeon: Judith Part, MD;  Location: [facility];  Service: Neurosurgery;  Laterality: N/A;  . COLONOSCOPY    . COLONOSCOPY W/ POLYPECTOMY  2009  . CRANIOTOMY Left 08/24/2020   Procedure: Left Craniotomy for Tumor Resection with Brainlab;  Surgeon: Judith Part, MD;  Location: [facility];  Service: Neurosurgery;  Laterality: Left;  . HEMORRHOID SURGERY    . JOINT REPLACEMENT    . POLYPECTOMY    . right total knee arthroplasty     Dr. Emeterio Reeve 06-04-18  . SPINE SURGERY  08/16/2009  . TOTAL KNEE ARTHROPLASTY Left 02/13/2014   Procedure: TOTAL KNEE ARTHROPLASTY;  Surgeon: Alta Corning, MD;  Location: [facility];  Service: Orthopedics;  Laterality: Left;  . TOTAL KNEE ARTHROPLASTY Right 06/04/2018   Procedure: RIGHT TOTAL KNEE ARTHROPLASTY;  Surgeon: Dorna Leitz, MD;  Location:  [facility];  Service: Orthopedics;  Laterality: Right;  Adductor Block  . TUBAL LIGATION      MEDICATIONS: . aspirin 81 MG tablet  . ALPRAZolam (XANAX) 0.5 MG tablet  . Ascorbic Acid (VITAMIN C) 1000 MG tablet  . carboxymethylcellulose (REFRESH PLUS) 0.5 % SOLN  . hydrochlorothiazide (HYDRODIURIL) 12.5 MG tablet  . loratadine (CLARITIN) 10 MG tablet  . mirtazapine (REMERON) 15 MG tablet  . neomycin-bacitracin-polymyxin (NEOSPORIN) ointment  .  omeprazole (PRILOSEC) 40 MG capsule  . polyethylene glycol (MIRALAX / GLYCOLAX) 17 g packet  . pravastatin (PRAVACHOL) 40 MG tablet  . Probiotic Product (PROBIOTIC DAILY PO)  . senna-docusate (SENOKOT-S) 8.6-50 MG tablet  . Wheat Dextrin (BENEFIBER) POWD   No current facility-administered medications for this encounter.    Myra Gianotti, PA-C Surgical Short Stay/Anesthesiology [facility] Phone [phone] [facility] Phone [phone] 09/21/2020 10:52 AM

## 2020-09-21 NOTE — Anesthesia Preprocedure Evaluation (Addendum)
Anesthesia Evaluation  Patient identified by MRN, date of birth, ID band Patient awake    Reviewed: Allergy & Precautions, NPO status , Patient's Chart, lab work & pertinent test results  History of Anesthesia Complications Negative for: history of anesthetic complications  Airway Mallampati: I  TM Distance: >3 FB Neck ROM: Full    Dental  (+) Edentulous Upper, Edentulous Lower   Pulmonary COPD, former smoker,  09/20/2020 SARS coronavirus NEG Stage IV non-small cell lung ca (brain mets)   breath sounds clear to auscultation       Cardiovascular hypertension, Pt. on medications (-) angina+ Peripheral Vascular Disease   Rhythm:Regular Rate:Normal  09/06/2020 ECHO: EF 65-70%, severe LVH, grade 1 DD, mild-mod AI   Neuro/Psych Anxiety Depression Recent craniotomy for tumor removal    GI/Hepatic Neg liver ROS, hiatal hernia, GERD  Medicated and Controlled,  Endo/Other  negative endocrine ROS  Renal/GU negative Renal ROS     Musculoskeletal  (+) Arthritis ,   Abdominal   Peds  Hematology negative hematology ROS (+)   Anesthesia Other Findings   Reproductive/Obstetrics                           Anesthesia Physical Anesthesia Plan  ASA: IV  Anesthesia Plan: General   Post-op Pain Management:    Induction: Intravenous  PONV Risk Score and Plan: 3 and Dexamethasone and Treatment may vary due to age or medical condition  Airway Management Planned: Oral ETT and Double Lumen EBT  Additional Equipment: Arterial line  Intra-op Plan:   Post-operative Plan: Extubation in OR  Informed Consent: I have reviewed the patients History and Physical, chart, labs and discussed the procedure including the risks, benefits and alternatives for the proposed anesthesia with the patient or authorized representative who has indicated his/her understanding and acceptance.       Plan Discussed with: CRNA  and Surgeon  Anesthesia Plan Comments: (PAT note written 09/21/2020 by Myra Gianotti, PA-C. )      Anesthesia Quick Evaluation

## 2020-09-24 ENCOUNTER — Other Ambulatory Visit: Payer: Self-pay

## 2020-09-24 ENCOUNTER — Encounter (HOSPITAL_COMMUNITY): Payer: Self-pay | Admitting: Thoracic Surgery (Cardiothoracic Vascular Surgery)

## 2020-09-24 ENCOUNTER — Inpatient Hospital Stay (HOSPITAL_COMMUNITY): Payer: Medicare HMO

## 2020-09-24 ENCOUNTER — Inpatient Hospital Stay (HOSPITAL_COMMUNITY): Payer: Medicare HMO | Admitting: Certified Registered Nurse Anesthetist

## 2020-09-24 ENCOUNTER — Inpatient Hospital Stay (HOSPITAL_COMMUNITY): Payer: Medicare HMO | Admitting: Vascular Surgery

## 2020-09-24 ENCOUNTER — Inpatient Hospital Stay (HOSPITAL_COMMUNITY)
Admission: RE | Admit: 2020-09-24 | Discharge: 2020-09-28 | DRG: 164 | Disposition: A | Discharge status: Home / Self Care | Payer: Medicare HMO | Attending: Thoracic Surgery (Cardiothoracic Vascular Surgery) | Admitting: Thoracic Surgery (Cardiothoracic Vascular Surgery)

## 2020-09-24 ENCOUNTER — Encounter (HOSPITAL_COMMUNITY)
Admission: RE | Disposition: A | Discharge status: Home / Self Care | Payer: Self-pay | Source: Home / Self Care | Attending: Thoracic Surgery (Cardiothoracic Vascular Surgery)

## 2020-09-24 DIAGNOSIS — I10 Essential (primary) hypertension: Secondary | ICD-10-CM | POA: Diagnosis present

## 2020-09-24 DIAGNOSIS — K219 Gastro-esophageal reflux disease without esophagitis: Secondary | ICD-10-CM | POA: Diagnosis present

## 2020-09-24 DIAGNOSIS — I4510 Unspecified right bundle-branch block: Secondary | ICD-10-CM | POA: Diagnosis not present

## 2020-09-24 DIAGNOSIS — J432 Centrilobular emphysema: Secondary | ICD-10-CM | POA: Diagnosis present

## 2020-09-24 DIAGNOSIS — Z4682 Encounter for fitting and adjustment of non-vascular catheter: Secondary | ICD-10-CM | POA: Diagnosis not present

## 2020-09-24 DIAGNOSIS — Z96653 Presence of artificial knee joint, bilateral: Secondary | ICD-10-CM | POA: Diagnosis present

## 2020-09-24 DIAGNOSIS — R001 Bradycardia, unspecified: Secondary | ICD-10-CM | POA: Diagnosis not present

## 2020-09-24 DIAGNOSIS — D62 Acute posthemorrhagic anemia: Secondary | ICD-10-CM | POA: Diagnosis not present

## 2020-09-24 DIAGNOSIS — Z833 Family history of diabetes mellitus: Secondary | ICD-10-CM

## 2020-09-24 DIAGNOSIS — J449 Chronic obstructive pulmonary disease, unspecified: Secondary | ICD-10-CM | POA: Diagnosis not present

## 2020-09-24 DIAGNOSIS — I6523 Occlusion and stenosis of bilateral carotid arteries: Secondary | ICD-10-CM | POA: Diagnosis present

## 2020-09-24 DIAGNOSIS — I351 Nonrheumatic aortic (valve) insufficiency: Secondary | ICD-10-CM | POA: Diagnosis not present

## 2020-09-24 DIAGNOSIS — E785 Hyperlipidemia, unspecified: Secondary | ICD-10-CM | POA: Diagnosis not present

## 2020-09-24 DIAGNOSIS — Z79899 Other long term (current) drug therapy: Secondary | ICD-10-CM

## 2020-09-24 DIAGNOSIS — I517 Cardiomegaly: Secondary | ICD-10-CM | POA: Diagnosis not present

## 2020-09-24 DIAGNOSIS — C7931 Secondary malignant neoplasm of brain: Secondary | ICD-10-CM | POA: Diagnosis not present

## 2020-09-24 DIAGNOSIS — Z8042 Family history of malignant neoplasm of prostate: Secondary | ICD-10-CM | POA: Diagnosis not present

## 2020-09-24 DIAGNOSIS — J939 Pneumothorax, unspecified: Secondary | ICD-10-CM | POA: Diagnosis not present

## 2020-09-24 DIAGNOSIS — E7439 Other disorders of intestinal carbohydrate absorption: Secondary | ICD-10-CM | POA: Diagnosis present

## 2020-09-24 DIAGNOSIS — J90 Pleural effusion, not elsewhere classified: Secondary | ICD-10-CM | POA: Diagnosis not present

## 2020-09-24 DIAGNOSIS — T797XXA Traumatic subcutaneous emphysema, initial encounter: Secondary | ICD-10-CM | POA: Diagnosis not present

## 2020-09-24 DIAGNOSIS — J9811 Atelectasis: Secondary | ICD-10-CM | POA: Diagnosis not present

## 2020-09-24 DIAGNOSIS — Z823 Family history of stroke: Secondary | ICD-10-CM | POA: Diagnosis not present

## 2020-09-24 DIAGNOSIS — Z902 Acquired absence of lung [part of]: Secondary | ICD-10-CM

## 2020-09-24 DIAGNOSIS — R918 Other nonspecific abnormal finding of lung field: Secondary | ICD-10-CM

## 2020-09-24 DIAGNOSIS — F419 Anxiety disorder, unspecified: Secondary | ICD-10-CM | POA: Diagnosis present

## 2020-09-24 DIAGNOSIS — R739 Hyperglycemia, unspecified: Secondary | ICD-10-CM | POA: Diagnosis not present

## 2020-09-24 DIAGNOSIS — Z803 Family history of malignant neoplasm of breast: Secondary | ICD-10-CM | POA: Diagnosis not present

## 2020-09-24 DIAGNOSIS — Z87891 Personal history of nicotine dependence: Secondary | ICD-10-CM

## 2020-09-24 DIAGNOSIS — T8182XA Emphysema (subcutaneous) resulting from a procedure, initial encounter: Secondary | ICD-10-CM | POA: Diagnosis not present

## 2020-09-24 DIAGNOSIS — Z7952 Long term (current) use of systemic steroids: Secondary | ICD-10-CM

## 2020-09-24 DIAGNOSIS — Z9889 Other specified postprocedural states: Secondary | ICD-10-CM

## 2020-09-24 DIAGNOSIS — F32A Depression, unspecified: Secondary | ICD-10-CM | POA: Diagnosis present

## 2020-09-24 DIAGNOSIS — J930 Spontaneous tension pneumothorax: Secondary | ICD-10-CM | POA: Diagnosis not present

## 2020-09-24 DIAGNOSIS — C3490 Malignant neoplasm of unspecified part of unspecified bronchus or lung: Secondary | ICD-10-CM | POA: Diagnosis not present

## 2020-09-24 DIAGNOSIS — R7303 Prediabetes: Secondary | ICD-10-CM | POA: Diagnosis present

## 2020-09-24 DIAGNOSIS — J439 Emphysema, unspecified: Secondary | ICD-10-CM | POA: Diagnosis not present

## 2020-09-24 DIAGNOSIS — Z90710 Acquired absence of both cervix and uterus: Secondary | ICD-10-CM

## 2020-09-24 DIAGNOSIS — C3411 Malignant neoplasm of upper lobe, right bronchus or lung: Principal | ICD-10-CM | POA: Diagnosis present

## 2020-09-24 DIAGNOSIS — T380X5A Adverse effect of glucocorticoids and synthetic analogues, initial encounter: Secondary | ICD-10-CM | POA: Diagnosis not present

## 2020-09-24 DIAGNOSIS — Z881 Allergy status to other antibiotic agents status: Secondary | ICD-10-CM

## 2020-09-24 DIAGNOSIS — E119 Type 2 diabetes mellitus without complications: Secondary | ICD-10-CM | POA: Diagnosis not present

## 2020-09-24 DIAGNOSIS — J95811 Postprocedural pneumothorax: Secondary | ICD-10-CM | POA: Diagnosis not present

## 2020-09-24 DIAGNOSIS — Z888 Allergy status to other drugs, medicaments and biological substances status: Secondary | ICD-10-CM

## 2020-09-24 DIAGNOSIS — Z7982 Long term (current) use of aspirin: Secondary | ICD-10-CM

## 2020-09-24 DIAGNOSIS — C3491 Malignant neoplasm of unspecified part of right bronchus or lung: Secondary | ICD-10-CM | POA: Diagnosis not present

## 2020-09-24 DIAGNOSIS — Z419 Encounter for procedure for purposes other than remedying health state, unspecified: Secondary | ICD-10-CM

## 2020-09-24 HISTORY — PX: INTERCOSTAL NERVE BLOCK: SHX5021

## 2020-09-24 HISTORY — PX: LYMPH NODE DISSECTION: SHX5087

## 2020-09-24 LAB — PREPARE RBC (CROSSMATCH)

## 2020-09-24 LAB — GLUCOSE, CAPILLARY
Glucose-Capillary: 104 mg/dL — ABNORMAL HIGH (ref 70–99)
Glucose-Capillary: 181 mg/dL — ABNORMAL HIGH (ref 70–99)
Glucose-Capillary: 166 mg/dL — ABNORMAL HIGH (ref 70–99)

## 2020-09-24 SURGERY — LOBECTOMY, LUNG, ROBOT-ASSISTED, USING VATS
Anesthesia: General | Site: Chest | Laterality: Right

## 2020-09-24 MED ORDER — POLYETHYLENE GLYCOL 3350 17 G PO PACK
17.0000 g | PACK | Freq: Two times a day (BID) | ORAL | Status: DC
  Administered 2020-09-25 – 2020-09-27 (×3): 17 g via ORAL
  Filled 2020-09-24 (×5): qty 1

## 2020-09-24 MED ORDER — ASPIRIN EC 81 MG PO TBEC
81.0000 mg | DELAYED_RELEASE_TABLET | Freq: Every day | ORAL | Status: DC
  Administered 2020-09-25 – 2020-09-28 (×4): 81 mg via ORAL
  Filled 2020-09-24 (×4): qty 1

## 2020-09-24 MED ORDER — PHENYLEPHRINE HCL-NACL 10-0.9 MG/250ML-% IV SOLN
INTRAVENOUS | Status: AC
  Filled 2020-09-24: qty 500

## 2020-09-24 MED ORDER — SENNOSIDES-DOCUSATE SODIUM 8.6-50 MG PO TABS
1.0000 | ORAL_TABLET | Freq: Every day | ORAL | Status: DC
  Filled 2020-09-24 (×2): qty 1

## 2020-09-24 MED ORDER — 0.9 % SODIUM CHLORIDE (POUR BTL) OPTIME
TOPICAL | Status: DC | PRN
  Administered 2020-09-24: 2000 mL

## 2020-09-24 MED ORDER — DEXAMETHASONE SODIUM PHOSPHATE 10 MG/ML IJ SOLN
INTRAMUSCULAR | Status: DC | PRN
  Administered 2020-09-24: 10 mg via INTRAVENOUS

## 2020-09-24 MED ORDER — ROCURONIUM BROMIDE 10 MG/ML (PF) SYRINGE
PREFILLED_SYRINGE | INTRAVENOUS | Status: AC
  Filled 2020-09-24: qty 10

## 2020-09-24 MED ORDER — KETOROLAC TROMETHAMINE 15 MG/ML IJ SOLN
15.0000 mg | Freq: Four times a day (QID) | INTRAMUSCULAR | Status: AC
  Administered 2020-09-24 – 2020-09-27 (×10): 15 mg via INTRAVENOUS
  Filled 2020-09-24 (×10): qty 1

## 2020-09-24 MED ORDER — CHLORHEXIDINE GLUCONATE CLOTH 2 % EX PADS
6.0000 | MEDICATED_PAD | Freq: Every day | CUTANEOUS | Status: DC
  Administered 2020-09-27 – 2020-09-28 (×2): 6 via TOPICAL

## 2020-09-24 MED ORDER — CEFAZOLIN SODIUM-DEXTROSE 2-4 GM/100ML-% IV SOLN
2.0000 g | Freq: Three times a day (TID) | INTRAVENOUS | Status: AC
  Administered 2020-09-24 – 2020-09-25 (×2): 2 g via INTRAVENOUS
  Filled 2020-09-24 (×2): qty 100

## 2020-09-24 MED ORDER — PROMETHAZINE HCL 25 MG/ML IJ SOLN: 6.2500 mg | INTRAMUSCULAR | Status: DC | PRN

## 2020-09-24 MED ORDER — MIDAZOLAM HCL 2 MG/2ML IJ SOLN: 0.5000 mg | Freq: Once | INTRAMUSCULAR | Status: DC | PRN

## 2020-09-24 MED ORDER — PROPOFOL 10 MG/ML IV BOLUS
INTRAVENOUS | Status: AC
  Filled 2020-09-24: qty 20

## 2020-09-24 MED ORDER — PROPOFOL 10 MG/ML IV BOLUS
INTRAVENOUS | Status: DC | PRN
  Administered 2020-09-24: 70 mg via INTRAVENOUS
  Administered 2020-09-24: 30 mg via INTRAVENOUS

## 2020-09-24 MED ORDER — ENOXAPARIN SODIUM 40 MG/0.4ML ~~LOC~~ SOLN
40.0000 mg | Freq: Every day | SUBCUTANEOUS | Status: DC
  Administered 2020-09-24 – 2020-09-27 (×4): 40 mg via SUBCUTANEOUS
  Filled 2020-09-24 (×4): qty 0.4

## 2020-09-24 MED ORDER — DEXMEDETOMIDINE (PRECEDEX) IN NS 20 MCG/5ML (4 MCG/ML) IV SYRINGE
PREFILLED_SYRINGE | INTRAVENOUS | Status: DC | PRN
  Administered 2020-09-24 (×2): 4 ug via INTRAVENOUS
  Administered 2020-09-24: 8 ug via INTRAVENOUS
  Administered 2020-09-24: 4 ug via INTRAVENOUS

## 2020-09-24 MED ORDER — OXYCODONE HCL 5 MG/5ML PO SOLN: 5.0000 mg | Freq: Once | ORAL | Status: DC | PRN

## 2020-09-24 MED ORDER — ACETAMINOPHEN 500 MG PO TABS
1000.0000 mg | ORAL_TABLET | Freq: Four times a day (QID) | ORAL | Status: DC
  Administered 2020-09-24 – 2020-09-28 (×13): 1000 mg via ORAL
  Filled 2020-09-24 (×14): qty 2

## 2020-09-24 MED ORDER — FENTANYL CITRATE (PF) 250 MCG/5ML IJ SOLN
INTRAMUSCULAR | Status: DC | PRN
  Administered 2020-09-24: 150 ug via INTRAVENOUS
  Administered 2020-09-24 (×4): 50 ug via INTRAVENOUS

## 2020-09-24 MED ORDER — DEXAMETHASONE 4 MG PO TABS
4.0000 mg | ORAL_TABLET | Freq: Every day | ORAL | Status: DC
  Filled 2020-09-24: qty 1

## 2020-09-24 MED ORDER — ACETAMINOPHEN 160 MG/5ML PO SOLN
1000.0000 mg | Freq: Four times a day (QID) | ORAL | Status: DC
  Administered 2020-09-24: 1000 mg via ORAL
  Filled 2020-09-24: qty 40.6

## 2020-09-24 MED ORDER — CEFAZOLIN SODIUM-DEXTROSE 2-4 GM/100ML-% IV SOLN
2.0000 g | INTRAVENOUS | Status: AC
  Administered 2020-09-24: 2 g via INTRAVENOUS
  Filled 2020-09-24: qty 100

## 2020-09-24 MED ORDER — LIDOCAINE 2% (20 MG/ML) 5 ML SYRINGE
INTRAMUSCULAR | Status: AC
  Filled 2020-09-24: qty 5

## 2020-09-24 MED ORDER — OXYCODONE HCL 5 MG PO TABS: 5.0000 mg | ORAL_TABLET | Freq: Once | ORAL | Status: DC | PRN

## 2020-09-24 MED ORDER — INSULIN ASPART 100 UNIT/ML ~~LOC~~ SOLN
0.0000 [IU] | SUBCUTANEOUS | Status: DC
  Administered 2020-09-24 (×2): 4 [IU] via SUBCUTANEOUS

## 2020-09-24 MED ORDER — BISACODYL 5 MG PO TBEC
10.0000 mg | DELAYED_RELEASE_TABLET | Freq: Every day | ORAL | Status: DC
  Administered 2020-09-25 – 2020-09-27 (×3): 10 mg via ORAL
  Filled 2020-09-24 (×3): qty 2

## 2020-09-24 MED ORDER — ONDANSETRON HCL 4 MG/2ML IJ SOLN
INTRAMUSCULAR | Status: AC
  Filled 2020-09-24: qty 2

## 2020-09-24 MED ORDER — FENTANYL CITRATE (PF) 250 MCG/5ML IJ SOLN
INTRAMUSCULAR | Status: AC
  Filled 2020-09-24: qty 5

## 2020-09-24 MED ORDER — SUGAMMADEX SODIUM 200 MG/2ML IV SOLN
INTRAVENOUS | Status: DC | PRN
  Administered 2020-09-24: 150 mg via INTRAVENOUS

## 2020-09-24 MED ORDER — BUPIVACAINE HCL (PF) 0.5 % IJ SOLN
INTRAMUSCULAR | Status: AC
  Filled 2020-09-24: qty 30

## 2020-09-24 MED ORDER — HYDROMORPHONE HCL 1 MG/ML IJ SOLN: 0.2500 mg | INTRAMUSCULAR | Status: DC | PRN

## 2020-09-24 MED ORDER — ALPRAZOLAM 0.5 MG PO TABS
0.5000 mg | ORAL_TABLET | Freq: Two times a day (BID) | ORAL | Status: DC | PRN
  Administered 2020-09-24 – 2020-09-27 (×6): 0.5 mg via ORAL
  Filled 2020-09-24 (×7): qty 1

## 2020-09-24 MED ORDER — ONDANSETRON HCL 4 MG/2ML IJ SOLN: 4.0000 mg | Freq: Four times a day (QID) | INTRAMUSCULAR | Status: DC | PRN

## 2020-09-24 MED ORDER — ACETAMINOPHEN 500 MG PO TABS
1000.0000 mg | ORAL_TABLET | Freq: Once | ORAL | Status: AC
  Administered 2020-09-24: 1000 mg via ORAL
  Filled 2020-09-24: qty 2

## 2020-09-24 MED ORDER — MIRTAZAPINE 7.5 MG PO TABS
15.0000 mg | ORAL_TABLET | Freq: Every day | ORAL | Status: DC
  Administered 2020-09-25 – 2020-09-27 (×3): 15 mg via ORAL
  Filled 2020-09-24 (×3): qty 2

## 2020-09-24 MED ORDER — BUPIVACAINE LIPOSOME 1.3 % IJ SUSP
INTRAMUSCULAR | Status: AC
  Filled 2020-09-24: qty 20

## 2020-09-24 MED ORDER — LACTATED RINGERS IV SOLN: INTRAVENOUS | Status: DC | PRN

## 2020-09-24 MED ORDER — DEXAMETHASONE SODIUM PHOSPHATE 10 MG/ML IJ SOLN
INTRAMUSCULAR | Status: AC
  Filled 2020-09-24: qty 1

## 2020-09-24 MED ORDER — ORAL CARE MOUTH RINSE: 15.0000 mL | Freq: Once | OROMUCOSAL | Status: AC

## 2020-09-24 MED ORDER — SODIUM CHLORIDE 0.9 % IV SOLN: INTRAVENOUS | Status: DC

## 2020-09-24 MED ORDER — LORATADINE 10 MG PO TABS
10.0000 mg | ORAL_TABLET | Freq: Every day | ORAL | Status: DC | PRN
  Administered 2020-09-26: 10 mg via ORAL
  Filled 2020-09-24: qty 1

## 2020-09-24 MED ORDER — BUPIVACAINE LIPOSOME 1.3 % IJ SUSP
INTRAMUSCULAR | Status: DC | PRN
  Administered 2020-09-24: 95 mL

## 2020-09-24 MED ORDER — LACTATED RINGERS IV SOLN: INTRAVENOUS | Status: DC

## 2020-09-24 MED ORDER — SODIUM CHLORIDE 0.9 % IV SOLN
INTRAVENOUS | Status: AC | PRN
  Administered 2020-09-24: 1000 mL via INTRAMUSCULAR

## 2020-09-24 MED ORDER — CHLORHEXIDINE GLUCONATE 0.12 % MT SOLN
15.0000 mL | Freq: Once | OROMUCOSAL | Status: AC
  Administered 2020-09-24: 15 mL via OROMUCOSAL
  Filled 2020-09-24: qty 15

## 2020-09-24 MED ORDER — LIDOCAINE 2% (20 MG/ML) 5 ML SYRINGE
INTRAMUSCULAR | Status: DC | PRN
  Administered 2020-09-24: 30 mg via INTRAVENOUS

## 2020-09-24 MED ORDER — DEXMEDETOMIDINE (PRECEDEX) IN NS 20 MCG/5ML (4 MCG/ML) IV SYRINGE
PREFILLED_SYRINGE | INTRAVENOUS | Status: AC
  Filled 2020-09-24: qty 5

## 2020-09-24 MED ORDER — PHENYLEPHRINE 40 MCG/ML (10ML) SYRINGE FOR IV PUSH (FOR BLOOD PRESSURE SUPPORT)
PREFILLED_SYRINGE | INTRAVENOUS | Status: AC
  Filled 2020-09-24: qty 10

## 2020-09-24 MED ORDER — TRAMADOL HCL 50 MG PO TABS
50.0000 mg | ORAL_TABLET | Freq: Four times a day (QID) | ORAL | Status: DC | PRN
  Administered 2020-09-25: 50 mg via ORAL
  Administered 2020-09-26 – 2020-09-27 (×2): 100 mg via ORAL
  Filled 2020-09-24: qty 2
  Filled 2020-09-24: qty 1
  Filled 2020-09-24: qty 2

## 2020-09-24 MED ORDER — FENTANYL CITRATE (PF) 100 MCG/2ML IJ SOLN
12.5000 ug | INTRAMUSCULAR | Status: DC | PRN
Start: 2020-09-24 — End: 2020-09-28

## 2020-09-24 MED ORDER — PRAVASTATIN SODIUM 40 MG PO TABS
40.0000 mg | ORAL_TABLET | Freq: Every evening | ORAL | Status: DC
  Administered 2020-09-25 – 2020-09-27 (×3): 40 mg via ORAL
  Filled 2020-09-24 (×3): qty 1

## 2020-09-24 MED ORDER — MIRTAZAPINE 7.5 MG PO TABS: 15.0000 mg | ORAL_TABLET | Freq: Every day | ORAL | Status: DC

## 2020-09-24 MED ORDER — MEPERIDINE HCL 25 MG/ML IJ SOLN: 6.2500 mg | INTRAMUSCULAR | Status: DC | PRN

## 2020-09-24 MED ORDER — ROCURONIUM BROMIDE 10 MG/ML (PF) SYRINGE
PREFILLED_SYRINGE | INTRAVENOUS | Status: DC | PRN
  Administered 2020-09-24: 30 mg via INTRAVENOUS
  Administered 2020-09-24: 60 mg via INTRAVENOUS

## 2020-09-24 MED ORDER — ONDANSETRON HCL 4 MG/2ML IJ SOLN
INTRAMUSCULAR | Status: DC | PRN
  Administered 2020-09-24: 4 mg via INTRAVENOUS

## 2020-09-24 SURGICAL SUPPLY — 107 items
BLADE CLIPPER SURG (BLADE) IMPLANT
CANISTER SUCT 3000ML PPV (MISCELLANEOUS) ×4 IMPLANT
CANNULA REDUC XI 12-8 STAPL (CANNULA) ×4
CANNULA REDUCER 12-8 DVNC XI (CANNULA) ×2 IMPLANT
CATH THORACIC 28FR (CATHETERS) IMPLANT
CATH THORACIC 28FR RT ANG (CATHETERS) IMPLANT
CATH THORACIC 36FR (CATHETERS) IMPLANT
CATH THORACIC 36FR RT ANG (CATHETERS) IMPLANT
CLIP VESOCCLUDE MED 6/CT (CLIP) IMPLANT
CNTNR URN SCR LID CUP LEK RST (MISCELLANEOUS) ×22 IMPLANT
CONN ST 1/4X3/8  BEN (MISCELLANEOUS) ×2
CONN ST 1/4X3/8 BEN (MISCELLANEOUS) ×1 IMPLANT
CONN Y 3/8X3/8X3/8  BEN (MISCELLANEOUS)
CONN Y 3/8X3/8X3/8 BEN (MISCELLANEOUS) IMPLANT
CONT SPEC 4OZ STRL OR WHT (MISCELLANEOUS) ×44
DEFOGGER SCOPE WARMER CLEARIFY (MISCELLANEOUS) ×2 IMPLANT
DERMABOND ADVANCED (GAUZE/BANDAGES/DRESSINGS) ×1
DERMABOND ADVANCED .7 DNX12 (GAUZE/BANDAGES/DRESSINGS) ×1 IMPLANT
DRAIN CHANNEL 28F RND 3/8 FF (WOUND CARE) ×2 IMPLANT
DRAIN CHANNEL 32F RND 10.7 FF (WOUND CARE) IMPLANT
DRAPE ARM DVNC X/XI (DISPOSABLE) ×4 IMPLANT
DRAPE COLUMN DVNC XI (DISPOSABLE) ×1 IMPLANT
DRAPE CV SPLIT W-CLR ANES SCRN (DRAPES) ×2 IMPLANT
DRAPE DA VINCI XI ARM (DISPOSABLE) ×8
DRAPE DA VINCI XI COLUMN (DISPOSABLE) ×2
DRAPE INCISE IOBAN 66X45 STRL (DRAPES) IMPLANT
DRAPE ORTHO SPLIT 77X108 STRL (DRAPES) ×2
DRAPE SURG ORHT 6 SPLT 77X108 (DRAPES) ×1 IMPLANT
ELECT BLADE 6.5 EXT (BLADE) ×2 IMPLANT
ELECT REM PT RETURN 9FT ADLT (ELECTROSURGICAL) ×2
ELECTRODE REM PT RTRN 9FT ADLT (ELECTROSURGICAL) ×1 IMPLANT
GAUZE KITTNER 4X5 RF (MISCELLANEOUS) ×4 IMPLANT
GAUZE SPONGE 4X4 12PLY STRL (GAUZE/BANDAGES/DRESSINGS) ×2 IMPLANT
GAUZE SPONGE 4X4 12PLY STRL LF (GAUZE/BANDAGES/DRESSINGS) ×2 IMPLANT
GLOVE SURG SS PI 8.0 STRL IVOR (GLOVE) ×2 IMPLANT
GLOVE SURG SYN 7.5  E (GLOVE)
GLOVE SURG SYN 7.5 E (GLOVE) IMPLANT
GLOVE TRIUMPH SURG SIZE 7.5 (KITS) IMPLANT
GOWN STRL REUS W/ TWL LRG LVL3 (GOWN DISPOSABLE) ×2 IMPLANT
GOWN STRL REUS W/ TWL XL LVL3 (GOWN DISPOSABLE) ×3 IMPLANT
GOWN STRL REUS W/TWL 2XL LVL3 (GOWN DISPOSABLE) ×2 IMPLANT
GOWN STRL REUS W/TWL LRG LVL3 (GOWN DISPOSABLE) ×4
GOWN STRL REUS W/TWL XL LVL3 (GOWN DISPOSABLE) ×6
HEMOSTAT SURGICEL 2X14 (HEMOSTASIS) ×4 IMPLANT
IRRIGATION STRYKERFLOW (MISCELLANEOUS) ×1 IMPLANT
IRRIGATOR STRYKERFLOW (MISCELLANEOUS) ×2
KIT BASIN OR (CUSTOM PROCEDURE TRAY) ×2 IMPLANT
KIT SUCTION CATH 14FR (SUCTIONS) IMPLANT
KIT TURNOVER KIT B (KITS) ×2 IMPLANT
LOOP VESSEL SUPERMAXI WHITE (MISCELLANEOUS) IMPLANT
NEEDLE HYPO 25GX1X1/2 BEV (NEEDLE) ×2 IMPLANT
NEEDLE SPNL 22GX3.5 QUINCKE BK (NEEDLE) ×2 IMPLANT
NS IRRIG 1000ML POUR BTL (IV SOLUTION) ×4 IMPLANT
PACK CHEST (CUSTOM PROCEDURE TRAY) ×2 IMPLANT
PAD ARMBOARD 7.5X6 YLW CONV (MISCELLANEOUS) ×4 IMPLANT
PORT ACCESS TROCAR AIRSEAL 12 (TROCAR) ×1 IMPLANT
PORT ACCESS TROCAR AIRSEAL 5M (TROCAR) ×1
RELOAD STAPLER 2.5X45 WHT DVNC (STAPLE) ×3 IMPLANT
RELOAD STAPLER 3.5X45 BLU DVNC (STAPLE) ×7 IMPLANT
RELOAD STAPLER 4.3X45 GRN DVNC (STAPLE) ×1 IMPLANT
SCISSORS LAP 5X35 DISP (ENDOMECHANICALS) IMPLANT
SEAL CANN UNIV 5-8 DVNC XI (MISCELLANEOUS) ×2 IMPLANT
SEAL XI 5MM-8MM UNIVERSAL (MISCELLANEOUS) ×4
SEALANT PROGEL (MISCELLANEOUS) IMPLANT
SEALANT SURG COSEAL 4ML (VASCULAR PRODUCTS) IMPLANT
SEALANT SURG COSEAL 8ML (VASCULAR PRODUCTS) IMPLANT
SET TRI-LUMEN FLTR TB AIRSEAL (TUBING) ×2 IMPLANT
SHEARS HARMONIC HDI 20CM (ELECTROSURGICAL) IMPLANT
SOLUTION ELECTROLUBE (MISCELLANEOUS) ×2 IMPLANT
SPONGE INTESTINAL PEANUT (DISPOSABLE) IMPLANT
SPONGE TONSIL TAPE 1 RFD (DISPOSABLE) IMPLANT
STAPLER 45 SUREFORM CVD (STAPLE) ×2
STAPLER 45 SUREFORM CVD DVNC (STAPLE) ×1 IMPLANT
STAPLER CANNULA SEAL DVNC XI (STAPLE) ×2 IMPLANT
STAPLER CANNULA SEAL XI (STAPLE) ×4
STAPLER RELOAD 2.5X45 WHITE (STAPLE) ×6
STAPLER RELOAD 2.5X45 WHT DVNC (STAPLE) ×3
STAPLER RELOAD 3.5X45 BLU DVNC (STAPLE) ×7
STAPLER RELOAD 3.5X45 BLUE (STAPLE) ×14
STAPLER RELOAD 4.3X45 GREEN (STAPLE) ×2
STAPLER RELOAD 4.3X45 GRN DVNC (STAPLE) ×1
SUT PDS AB 3-0 SH 27 (SUTURE) IMPLANT
SUT PROLENE 4 0 RB 1 (SUTURE)
SUT PROLENE 4-0 RB1 .5 CRCL 36 (SUTURE) IMPLANT
SUT SILK  1 MH (SUTURE) ×4
SUT SILK 1 MH (SUTURE) ×2 IMPLANT
SUT SILK 1 TIES 10X30 (SUTURE) ×2 IMPLANT
SUT SILK 2 0 SH (SUTURE) ×2 IMPLANT
SUT SILK 2 0SH CR/8 30 (SUTURE) ×2 IMPLANT
SUT SILK 3 0SH CR/8 30 (SUTURE) IMPLANT
SUT VIC AB 1 CTX 36 (SUTURE) ×2
SUT VIC AB 1 CTX36XBRD ANBCTR (SUTURE) ×1 IMPLANT
SUT VIC AB 2-0 CTX 36 (SUTURE) ×2 IMPLANT
SUT VIC AB 3-0 MH 27 (SUTURE) IMPLANT
SUT VIC AB 3-0 X1 27 (SUTURE) ×4 IMPLANT
SUT VICRYL 0 TIES 12 18 (SUTURE) ×2 IMPLANT
SUT VICRYL 0 UR6 27IN ABS (SUTURE) ×4 IMPLANT
SUT VICRYL 2 TP 1 (SUTURE) IMPLANT
SYR 20CC LL (SYRINGE) ×6 IMPLANT
SYSTEM RETRIEVAL ANCHOR 15 (MISCELLANEOUS) ×2 IMPLANT
SYSTEM SAHARA CHEST DRAIN ATS (WOUND CARE) ×2 IMPLANT
TAPE CLOTH 4X10 WHT NS (GAUZE/BANDAGES/DRESSINGS) ×2 IMPLANT
TIP APPLICATOR SPRAY EXTEND 16 (VASCULAR PRODUCTS) IMPLANT
TOWEL GREEN STERILE (TOWEL DISPOSABLE) ×2 IMPLANT
TRAY FOLEY MTR SLVR 14FR STAT (SET/KITS/TRAYS/PACK) ×2 IMPLANT
TRAY FOLEY MTR SLVR 16FR STAT (SET/KITS/TRAYS/PACK) IMPLANT
WATER STERILE IRR 1000ML POUR (IV SOLUTION) ×2 IMPLANT

## 2020-09-24 NOTE — Transfer of Care (Signed)
Immediate Anesthesia Transfer of Care Note  Patient: Hannah [name]  Procedure(s) Performed: XI ROBOTIC ASSISTED THORASCOPY-RIGHT UPPER LOBECTOMY (Right Chest) INTERCOSTAL NERVE BLOCK (Right Chest) LYMPH NODE DISSECTION (Right Chest)  Patient Location: PACU  Anesthesia Type:General  Level of Consciousness: drowsy and patient cooperative  Airway & Oxygen Therapy: Patient Spontanous Breathing and Patient connected to face mask oxygen  Post-op Assessment: Report given to RN and Post -op Vital signs reviewed and stable  Post vital signs: Reviewed and stable  Last Vitals:  Vitals Value Taken Time  BP 151/80 09/24/20 1125  Temp    Pulse 60 09/24/20 1127  Resp 19 09/24/20 1127  SpO2 100 % 09/24/20 1127  Vitals shown include unvalidated device data.  Last Pain:  Vitals:   09/24/20 0642  TempSrc:   PainSc: 0-No pain      Patients Stated Pain Goal: 3 (47/07/61 5183)  Complications: No complications documented.

## 2020-09-24 NOTE — Plan of Care (Signed)

## 2020-09-24 NOTE — Anesthesia Procedure Notes (Signed)
Arterial Line Insertion Start/End3/28/2022 6:55 AM, 09/24/2020 7:02 AM Performed by: Lowella Dell, CRNA, CRNA  Patient location: Pre-op. Preanesthetic checklist: patient identified, IV checked, site marked, risks and benefits discussed, surgical consent, monitors and equipment checked, pre-op evaluation, timeout performed and anesthesia consent Lidocaine 1% used for infiltration Left, radial was placed Catheter size: 20 G Hand hygiene performed  and maximum sterile barriers used   Attempts: 2 Procedure performed without using ultrasound guided technique. Following insertion, dressing applied and Biopatch. Post procedure assessment: normal  Patient tolerated the procedure well with no immediate complications. Additional procedure comments: Placed by Chanetta Marshall.

## 2020-09-24 NOTE — Hospital Course (Addendum)
HPI: Mrs. Hannah [name] is sent for consultation regarding a right upper lobe squamous cell carcinoma.   Hannah [name] is a 70 year old woman with a history of tobacco abuse, right bundle branch block, mild to moderate AI, dyslipidemia, hypertension, reflux, anemia, osteoarthritis, glucose intolerance, moderate carotid disease, anxiety and depression.  She presented with expressive aphasia and right arm weakness.  She was found to have a brain metastasis with edema.  Her symptoms improved significantly with steroids.  She then underwent SRS followed by craniotomy and resection.  In addition her work-up included a CT of the chest abdomen pelvis which showed a 4.8 cm right upper lobe mass.  On PET CT the mass was hypermetabolic.  There was no mediastinal or hilar adenopathy and no evidence of distant metastases other than the isolated brain metastasis.   She had her SRS and then had a craniotomy on 08/24/2020.  She did well with that and went home the next day.  She has been walking without difficulty.  She did quit smoking prior to that admission.  She had about a 40-pack-year history of smoking prior to quitting.  She has had a 20 pound weight loss over 6 months with a 10 pound weight loss over the past 3 months.  She does complain of some decreased energy.  She has difficulty swallowing large pills but no other issues with that.  She was treated for some bleeding hemorrhoids in the past month as well.  Hospital Course:   On 09/24/2020 Hannah [name] underwent a robotic-assisted right upper lobectomy with Dr. Roxan Hockey. The patient tolerated the procedure well and was transferred to the progressive care unit in stable condition.  Post operative CXR showed an apical space vs. Pneumothorax.  Her chest tube was placed on suction.  Her blood glucose levels were elevated due to decadron administered IV Decadron.  Her sliding scale was adjusted accordingly.  The patient's follow up CXR showed resolution of apical  space/pneumothorax.  Her chest tube was again placed on water seal.  Follow up CXR showed trace apical space.  She had no air leak present.  The output decreased and chest tube was able to be removed on 09/27/2020.  Follow up CXR showed ***.  Her hemoglobin level dropped post operatively.  She required transfusion of 2 units of packed cells.  Her repeat H/H was 9.6/29.9.  The patient has moved her bowels.  She is ambulating without difficulty.  Her surgical incisions show no evidence of infection.  She is medically stable for discharge home today.

## 2020-09-24 NOTE — Progress Notes (Signed)
      [address]       [place] [id number]             [phone]      Vagal spell with getting up to go to bathroom Now resting comfortably BP (!) 90/58 (BP Location: Right Arm)   Pulse 75   Temp (!) 97.2 F (36.2 C) (Oral)   Resp 19   Ht 5' (1.524 m)   Wt 49.4 kg   SpO2 99%   BMI 21.29 kg/m   Intake/Output Summary (Last 24 hours) at 09/24/2020 1746 Last data filed at 09/24/2020 1500 Gross per 24 hour  Intake 1400 ml  Output 985 ml  Net 415 ml   No air leak  Doing well early postop  Remo Lipps C. Roxan Hockey, MD [facility] [phone]

## 2020-09-24 NOTE — Interval H&P Note (Signed)
History and Physical Interval Note:  FEV1 1.87, DLCO 92% of predicted  Echo- normal EF, mild-moderate AI, no AS  09/24/2020 7:18 AM  Hannah [name]  has presented today for surgery, with the diagnosis of RUL MASS.  The various methods of treatment have been discussed with the patient and family. After consideration of risks, benefits and other options for treatment, the patient has consented to  Procedure(s): XI ROBOTIC ASSISTED THORASCOPY-RIGHT UPPER LOBECTOMY (Right) as a surgical intervention.  The patient's history has been reviewed, patient examined, no change in status, stable for surgery.  I have reviewed the patient's chart and labs.  Questions were answered to the patient's satisfaction.     Melrose Nakayama

## 2020-09-24 NOTE — Progress Notes (Signed)
Art line discontinued at 1220 pressure held and dressing in place

## 2020-09-24 NOTE — Brief Op Note (Addendum)
09/24/2020  10:57 AM  PATIENT:  Hannah [name]  70 y.o. female  PRE-OPERATIVE DIAGNOSIS:  RUL MASS- Stage IV adenocarcinoma ([id number])  POST-OPERATIVE DIAGNOSIS:  RUL MASS- Stage IV adenocarcinoma ([id number])  PROCEDURE:  Procedure(s): XI ROBOTIC ASSISTED THORASCOPY-RIGHT UPPER LOBECTOMY (Right) INTERCOSTAL NERVE BLOCK (Right) LYMPH NODE DISSECTION (Right)  SURGEON:  Surgeon(s) and Role:    * Melrose Nakayama, MD - Primary  PHYSICIAN ASSISTANT:  Nicholes Rough, PA-C  ANESTHESIA:   general  EBL:  30 mL   BLOOD ADMINISTERED:none  DRAINS: routine   LOCAL MEDICATIONS USED:  BUPIVICAINE   SPECIMEN:  Source of Specimen:  right upper lobe, lymph nodes  DISPOSITION OF SPECIMEN:  PATHOLOGY  COUNTS:  YES  DICTATION: .Dragon Dictation  PLAN OF CARE: Admit to inpatient   PATIENT DISPOSITION:  PACU - hemodynamically stable.   Delay start of Pharmacological VTE agent (>24hrs) due to surgical blood loss or risk of bleeding: no

## 2020-09-24 NOTE — Op Note (Signed)
NAMEKENDI, DEFALCO MEDICAL RECORD NO: [id number] ACCOUNT NO: [id number] DATE OF BIRTH: 1951/06/15 FACILITY: [facility] LOCATION: [facility] PHYSICIAN: Revonda Standard. Roxan Hockey, MD  Operative Report   DATE OF PROCEDURE: 09/24/2020  PREOPERATIVE DIAGNOSIS:  Adenocarcinoma, right upper lobe, stage IV (T3 N0 M1b).  POSTOPERATIVE DIAGNOSIS:  Adenocarcinoma, right upper lobe, stage IV (T3 N0 M1b).  PROCEDURE:   Xi-Robotic-assisted right upper lobectomy with en bloc wedge resection of right middle lobe, Lymph node dissection, and Intercostal nerve blocks at levels 3 through 10.  SURGEON:  Revonda Standard. Roxan Hockey, MD  ASSISTANT:  Nicholes Rough, PA  ANESTHESIA:  General.  FINDINGS:  No significant pleural effusion. No involvement of parietal pleura.  Mass with adhesions across minor fissure.  Multiple enlarged lymph nodes.  Bronchial margin negative for tumor.  CLINICAL NOTE:  Mrs. Didion is a 70 year old woman who initially presented with right arm weakness and expressive aphasia.  She was found to have left brain metastasis, which was treated and was found to be a poorly differentiated adenocarcinoma  consistent with lung primary.  Further workup revealed a 5 cm right upper lobe mass that was hypermetabolic with central necrosis.  There was no evidence of lymph node involvement or other distant metastases.  She was offered the option of surgical  resection.  She understood that in the setting of solitary brain metastasis there is a possibility of a cure, but there was no guarantee.  She understood and accepted the risks and wished to proceed with surgical resection.  The indications, risks,  benefits, and alternatives were discussed in detail with her.  She understood and accepted them and agreed to proceed.  OPERATIVE NOTE:  Mrs. Demeo was brought to the preoperative holding area on 09/24/2020.  Anesthesia established central venous access and placed an arterial blood pressure monitoring line.  She  was taken to the operating room, anesthetized, and  intubated with a double-lumen endotracheal tube.  Intravenous antibiotics were administered.  Foley catheter was placed.  Sequential compression devices were placed on the calves for DVT prophylaxis.  She was placed in a left lateral decubitus position.   A Bair Hugger was placed for active warming.  The right chest was prepped and draped in the usual sterile fashion.  Single lung ventilation of the left lung was initiated and was tolerated well throughout the procedure.  A timeout was performed.  A solution containing 20 mL of liposomal bupivacaine, 30 mL of 0.5% bupivacaine, and 50 mL of saline was prepared.  It was used for local at the incision sites as well as for the intercostal nerve blocks.  An incision was made  in the 8th interspace in the midaxillary line.  An 8 mm port was inserted.  The thoracoscope was advanced into the chest.  There was good isolation of the right lung.  There was no involvement of the parietal pleura and no pleural effusion.  A 12 mm port  was placed in the 8th interspace anterior to the camera port. Carbon dioxide was insufflated per protocol.  Intercostal nerve blocks then were performed from the 3rd to the 10th interspace. 10 mL of the bupivacaine solution was injected into a  subpleural plane at each level.  Two additional ports were placed in the 8th interspace, and then a 12 mm AirSeal port was placed in the 10th interspace posterolaterally.  Robotic instruments were inserted with thoracoscopic visualization.  The lung was inspected.  The mass was clearly visible on the lateral aspect of the upper lobe.  There was no involvement of the major fissure and no abnormality of the parietal pleura.  The lung was retracted superiorly, and the inferior ligament was  divided with bipolar cautery.  No lymph node was seen in that region.  Dissection was carried to the pleural reflection posteriorly and multiple level 7 and a  level 8 node were removed.  Multiple level 11 nodes and also a level 10 node were dissected out  as well. Moving more superiorly, the pleural reflection was divided.  An additional level 10 node was removed and then multiple 4R nodes were removed.  The phrenic nerve was clearly visualized and no cautery was used in the vicinity of it.  At this  point, attention was turned to the fissure.  The fissure was relatively complete.  At the confluence of the fissures, there was a node visible.  This was dissected out and removed.  This exposed the main pulmonary artery.  The posterior ascending branch  was visible, and it had been partially dissected out while removing the level 11 nodes from the posterior approach.  The fissure was completed posteriorly primarily using bipolar cautery.  The posterior ascending branch was essentially completely  dissected out.  It was encircled and then divided with the robotic stapler using a vascular cartridge.  Attention then was turned to the minor fissure.  It was clear that there was a true minor fissure. Then, there were adhesions of the middle lobe to the  tumor that was separate and distinct from that.  Stapler was used from a posterior approach to remove a wedge of the middle lobe in continuity with the tumor maintaining a wide gross margin.  The minor fissure proper then was inspected.  It was divided  with multiple firings of the robotic stapler using blue cartridges.  The right middle lobe pulmonary vein was identified and preserved.  The upper lobe superior pulmonary vein branches were dissected out and divided with the robotic stapler using a white  cartridge.  Dissection was carried out along the main pulmonary artery, and it was at this point that the final stapling of the minor fissure was completed which left only a large common anterior apical trunk and the right upper lobe bronchus.  The  anterior apical pulmonary artery trunk was divided after carefully being  dissected out and then the stapler was placed across the right upper lobe bronchus at its origin and closed.  A green cartridge was used for the bronchus.  A test inflation showed  good aeration of the lower and middle lobes.  The stapler was fired transecting the bronchus.  The vessel loop and sponges that had been used during the dissection were removed.  The chest was copiously irrigated with warm saline.  A test inflation to 30  cm of water revealed no leakage from the staple lines or the bronchial stump.  The lower and middle lobes were stapled together using the robotic stapler with a blue cartridge to prevent torsion of the middle lobe.  The AirSeal port then was removed and  a 15 mm endoscopic retrieval bag was inserted.  The upper lobe was placed into this bag, which was then closed, and brought down to the inferior aspect of the chest.  The robotic instruments were removed, and the robot was undocked.  The anterior 8th interspace incision then was lengthened to approximately 4-5 cm in length and the upper lobe was removed and sent for frozen section of the bronchial margin, which subsequently  returned with no tumor seen.  Chest was again copiously  irrigated with warm saline.  A second test inflation revealed no air leakage.  A 28-French Blake drain was placed through the original 8th interspace incision and secured with a #1 silk suture.  Dual lung ventilation was resumed.  There was good  reexpansion of the lower and middle lobes.  The incisions were closed in standard fashion.  Dermabond was applied.  The chest tube was placed to a Pleur-Evac on waterseal.  The patient then was extubated in the operating room and taken to the  postanesthetic care unit in good condition.  An x-ray was performed in the operating room because there was a snapped cable on one of the robotic instruments.  No foreign body was seen.  The sponge, needle, and instrument counts were correct.   ROH D: 09/24/2020  3:22:43 pm T: 09/24/2020 9:11:00 pm  JOB: [id number]/ [id number]

## 2020-09-24 NOTE — Anesthesia Procedure Notes (Signed)
Procedure Name: Intubation Date/Time: 09/24/2020 7:44 AM Performed by: Lowella Dell, CRNA Pre-anesthesia Checklist: Patient identified, Emergency Drugs available, Suction available and Patient being monitored Patient Re-evaluated:Patient Re-evaluated prior to induction Oxygen Delivery Method: Circle system utilized Preoxygenation: Pre-oxygenation with 100% oxygen Induction Type: IV induction Ventilation: Mask ventilation without difficulty and Oral airway inserted - appropriate to patient size Laryngoscope Size: Sabra Heck and 2 Grade View: Grade I Tube type: Oral Endobronchial tube: Double lumen EBT and EBT position confirmed by auscultation and 35 Fr Number of attempts: 1 Airway Equipment and Method: Stylet and Oral airway Placement Confirmation: ETT inserted through vocal cords under direct vision,  positive ETCO2 and breath sounds checked- equal and bilateral Tube secured with: Tape Dental Injury: Teeth and Oropharynx as per pre-operative assessment  Comments: Performed by Dario Guardian

## 2020-09-24 NOTE — Anesthesia Postprocedure Evaluation (Signed)
Anesthesia Post Note  Patient: Hannah [name]  Procedure(s) Performed: XI ROBOTIC ASSISTED THORASCOPY-RIGHT UPPER LOBECTOMY (Right Chest) INTERCOSTAL NERVE BLOCK (Right Chest) LYMPH NODE DISSECTION (Right Chest)     Patient location during evaluation: PACU Anesthesia Type: General Level of consciousness: awake and alert, patient cooperative and oriented Pain management: pain level controlled Vital Signs Assessment: post-procedure vital signs reviewed and stable Respiratory status: spontaneous breathing, nonlabored ventilation, respiratory function stable and patient connected to nasal cannula oxygen Cardiovascular status: blood pressure returned to baseline and stable Postop Assessment: no apparent nausea or vomiting Anesthetic complications: no   No complications documented.  Last Vitals:  Vitals:   09/24/20 1352 09/24/20 1422  BP: 103/65 103/75  Pulse: 64 75  Resp: 15 17  Temp: (!) 36.2 C (!) 36.2 C  SpO2: 96% 99%    Last Pain:  Vitals:   09/24/20 1422  TempSrc:   PainSc: Asleep                 Chauncey Bruno,E. Reegan Bouffard

## 2020-09-24 NOTE — Plan of Care (Signed)
  Problem: Education: Goal: Knowledge of General Education information will improve Description: Including pain rating scale, medication(s)/side effects and non-pharmacologic comfort measures Outcome: Progressing   Problem: Clinical Measurements: Goal: Will remain free from infection Outcome: Progressing Goal: Respiratory complications will improve Outcome: Progressing   Problem: Coping: Goal: Level of anxiety will decrease Outcome: Progressing   Problem: Pain Managment: Goal: General experience of comfort will improve Outcome: Progressing

## 2020-09-25 ENCOUNTER — Encounter (HOSPITAL_COMMUNITY): Payer: Self-pay | Admitting: Thoracic Surgery (Cardiothoracic Vascular Surgery)

## 2020-09-25 ENCOUNTER — Inpatient Hospital Stay (HOSPITAL_COMMUNITY): Payer: Medicare HMO

## 2020-09-25 LAB — BASIC METABOLIC PANEL
Anion gap: 9 (ref 5–15)
BUN: 14 mg/dL (ref 8–23)
CO2: 25 mmol/L (ref 22–32)
Calcium: 8.7 mg/dL — ABNORMAL LOW (ref 8.9–10.3)
Chloride: 100 mmol/L (ref 98–111)
Creatinine, Ser: 0.9 mg/dL (ref 0.44–1.00)
GFR, Estimated: >60 mL/min (ref >60)
Glucose, Bld: 241 mg/dL — ABNORMAL HIGH (ref 70–99)
Potassium: 4.3 mmol/L (ref 3.5–5.1)
Sodium: 134 mmol/L — ABNORMAL LOW (ref 135–145)

## 2020-09-25 LAB — GLUCOSE, CAPILLARY
Glucose-Capillary: 101 mg/dL — ABNORMAL HIGH (ref 70–99)
Glucose-Capillary: 108 mg/dL — ABNORMAL HIGH (ref 70–99)
Glucose-Capillary: 118 mg/dL — ABNORMAL HIGH (ref 70–99)
Glucose-Capillary: 136 mg/dL — ABNORMAL HIGH (ref 70–99)
Glucose-Capillary: 143 mg/dL — ABNORMAL HIGH (ref 70–99)
Glucose-Capillary: 150 mg/dL — ABNORMAL HIGH (ref 70–99)
Glucose-Capillary: 95 mg/dL (ref 70–99)

## 2020-09-25 LAB — CBC
HCT: 21.5 % — ABNORMAL LOW (ref 36.0–46.0)
HCT: 22.8 % — ABNORMAL LOW (ref 36.0–46.0)
Hemoglobin: 6.8 g/dL — CL (ref 12.0–15.0)
Hemoglobin: 7.1 g/dL — ABNORMAL LOW (ref 12.0–15.0)
MCH: 26.9 pg (ref 26.0–34.0)
MCH: 26.7 pg (ref 26.0–34.0)
MCHC: 31.6 g/dL (ref 30.0–36.0)
MCHC: 31.1 g/dL (ref 30.0–36.0)
MCV: 85 fL (ref 80.0–100.0)
MCV: 85.7 fL (ref 80.0–100.0)
Platelets: 194 10*3/uL (ref 150–400)
Platelets: 188 10*3/uL (ref 150–400)
RBC: 2.53 MIL/uL — ABNORMAL LOW (ref 3.87–5.11)
RBC: 2.66 MIL/uL — ABNORMAL LOW (ref 3.87–5.11)
RDW: 13.2 % (ref 11.5–15.5)
RDW: 13.4 % (ref 11.5–15.5)
WBC: 8.4 10*3/uL (ref 4.0–10.5)
WBC: 8.8 10*3/uL (ref 4.0–10.5)
nRBC: 0 % (ref 0.0–0.2)
nRBC: 0 % (ref 0.0–0.2)

## 2020-09-25 LAB — HEMOGLOBIN A1C
Hgb A1c MFr Bld: 6.6 % — ABNORMAL HIGH (ref 4.8–5.6)
Mean Plasma Glucose: 142.72 mg/dL

## 2020-09-25 MED ORDER — PANTOPRAZOLE SODIUM 40 MG PO TBEC
40.0000 mg | DELAYED_RELEASE_TABLET | Freq: Two times a day (BID) | ORAL | Status: DC
  Administered 2020-09-25 – 2020-09-28 (×6): 40 mg via ORAL
  Filled 2020-09-25 (×6): qty 1

## 2020-09-25 MED ORDER — INSULIN ASPART 100 UNIT/ML ~~LOC~~ SOLN
0.0000 [IU] | Freq: Three times a day (TID) | SUBCUTANEOUS | Status: DC
  Administered 2020-09-25: 2 [IU] via SUBCUTANEOUS

## 2020-09-25 MED ORDER — GUAIFENESIN ER 600 MG PO TB12
600.0000 mg | ORAL_TABLET | Freq: Two times a day (BID) | ORAL | Status: DC
  Administered 2020-09-25 – 2020-09-28 (×7): 600 mg via ORAL
  Filled 2020-09-25 (×7): qty 1

## 2020-09-25 MED ORDER — INSULIN ASPART 100 UNIT/ML ~~LOC~~ SOLN: 0.0000 [IU] | Freq: Every day | SUBCUTANEOUS | Status: DC

## 2020-09-25 MED ORDER — PHENYLEPHRINE HCL-NACL 10-0.9 MG/250ML-% IV SOLN
INTRAVENOUS | Status: AC
  Filled 2020-09-25: qty 250

## 2020-09-25 NOTE — Progress Notes (Signed)
CBC redraw back at 7.1. Paged Dr. Roxan Hockey. He returned page. No new orders. Continue to monitor.

## 2020-09-25 NOTE — Discharge Instructions (Signed)
[facility] office [phone]   Robot-Assisted Thoracic Surgery, Care After The following information offers guidance on how to care for yourself after your procedure. Your health care provider may also give you more specific instructions. If you have problems or questions, contact your health care provider. What can I expect after the procedure? After the procedure, it is common to have:  Some pain and aches in the area of your surgical incisions.  Pain when breathing in (inhaling) and coughing.  Tiredness (fatigue).  Trouble sleeping.  Constipation. Follow these instructions at home: Medicines  Take over-the-counter and prescription medicines only as told by your health care provider.  If you were prescribed an antibiotic medicine, take it as told by your health care provider. Do not stop taking the antibiotic even if you start to feel better.  Talk with your health care provider about safe and effective ways to manage pain after your procedure. Pain management should fit your specific health needs.  Take pain medicine before pain becomes severe. Relieving and controlling your pain will make breathing easier for you.  Ask your health care provider if the medicine prescribed to you requires you to avoid driving or using machinery. Eating and drinking Follow instructions from your health care provider about eating or drinking restrictions. These will vary depending on what procedure you had. Your health care provider may recommend:  A liquid diet or soft diet for the first few days.  Meals that are smaller and more frequent.  A diet of fruits, vegetables, whole grains, and low-fat proteins.  Limiting foods that are high in fat and processed sugar, including fried or sweet foods. Incision care  Follow instructions from your health care provider about how to take care of your incisions. Make sure you: ? Wash your hands with soap and water for at least 20 seconds before and after  you change your bandage (dressing). If soap and water are not available, use hand sanitizer. ? Change your dressing as told by your health care provider. ? Leave stitches (sutures), skin glue, or adhesive strips in place. These skin closures may need to stay in place for 2 weeks or longer. If adhesive strip edges start to loosen and curl up, you may trim the loose edges. Do not remove adhesive strips completely unless your health care provider tells you to do that.  Check your incision area every day for signs of infection. Check for: ? Redness, swelling, or more pain. ? Fluid or blood. ? Warmth. ? Pus or a bad smell. Activity  Return to your normal activities as told by your health care provider. Ask your health care provider what activities are safe for you.  Ask your health care provider when it is safe for you to drive.  Do not lift anything that is heavier than 10 lb (4.5 kg), or the limit that you are told, until your health care provider says that it is safe.  Rest as told by your health care provider.  Avoid sitting for a long time without moving. Get up to take short walks every 1-2 hours. This is important to improve blood flow and breathing. Ask for help if you feel weak or unsteady.  Do exercises as told by your health care provider. Pneumonia prevention  Do deep breathing exercises and cough regularly as directed. This helps clear mucus and opens your lungs. Doing this helps prevent lung infection (pneumonia).  If you were given an incentive spirometer, use it as told. An incentive spirometer  is a tool that measures how well you are filling your lungs with each breath.  Coughing may hurt less if you try to support your chest. This is called splinting. Try one of these when you cough: ? Hold a pillow against your chest. ? Place the palms of both hands on top of your incision area.  Do not use any products that contain nicotine or tobacco. These products include cigarettes,  chewing tobacco, and vaping devices, such as e-cigarettes. If you need help quitting, ask your health care provider.  Avoid secondhand smoke.   General instructions  If you have a drainage tube: ? Follow instructions from your health care provider about how to take care of it. ? Do not travel by airplane after your tube is removed until your health care provider tells you it is safe.  You may need to take these actions to prevent or treat constipation: ? Drink enough fluid to keep your urine pale yellow. ? Take over-the-counter or prescription medicines. ? Eat foods that are high in fiber, such as beans, whole grains, and fresh fruits and vegetables. ? Limit foods that are high in fat and processed sugars, such as fried or sweet foods.  Keep all follow-up visits. This is important. Contact a health care provider if:  You have redness, swelling, or more pain around an incision.  You have fluid or blood coming from an incision.  An incision feels warm to the touch.  You have pus or a bad smell coming from an incision.  You have a fever.  You cannot eat or drink without vomiting.  Your pain medicine is not controlling your pain. Get help right away if:  You have chest pain.  Your heart is beating quickly.  You have trouble breathing.  You have trouble speaking.  You are confused.  You feel weak or dizzy, or you faint. These symptoms may represent a serious problem that is an emergency. Do not wait to see if the symptoms will go away. Get medical help right away. Call your local emergency services (911 in the [place] Do not drive yourself to the hospital. Summary  Talk with your health care provider about safe and effective ways to manage pain after your procedure. Pain management should fit your specific health needs.  Return to your normal activities as told by your health care provider. Ask your health care provider what activities are safe for you.  Do deep breathing  exercises and cough regularly as directed. This helps to clear mucus and prevent pneumonia. If it hurts to cough, ease pain by holding a pillow against your chest or by placing the palms of both hands over your incisions. This information is not intended to replace advice given to you by your health care provider. Make sure you discuss any questions you have with your health care provider. Document Revised: 03/09/2020 Document Reviewed: 03/09/2020 [employer]vier Patient Education  2021 [employer].

## 2020-09-25 NOTE — Discharge Summary (Addendum)
Physician Discharge Summary  Patient ID: EDITHE DOBBIN MRN: [id number] DOB/AGE: [birth year] 72 y.o.  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 S/P robot-assisted surgical procedure 09/24/2020  . Aortic atherosclerosis ([place]) 09/12/2020  . Lung mass 09/04/2020  . Status post craniotomy 08/24/2020  . Brain tumor ([place]) 08/24/2020  . Hematochezia 08/19/2020  . Acute blood loss anemia   . Rectal bleeding 08/17/2020  . Primary cancer of right upper lobe of lung ([place]) 08/07/2020  . Brain metastasis ([place]) 08/06/2020  . Memory changes 07/24/2020  . Non-recurrent unilateral inguinal hernia without obstruction or gangrene 07/24/2020  . Pain of right clavicle 09/12/2019  . Nonspecific abnormal electrocardiogram (ECG) (EKG) 10/20/2018  . Educated about COVID-19 virus infection 10/20/2018  . Weight loss 07/17/2018  . Depression 07/17/2018  . Primary osteoarthritis of right knee 06/04/2018  . Bilateral carotid artery stenosis 09/24/2017  . Aortic valve sclerosis 09/24/2017  . Vocal cord polyps 09/07/2017  . Dysphagia 09/07/2017  . Diabetes mellitus without complication ([place]) 74/01/1447  . GERD (gastroesophageal reflux disease) 02/27/2016  . Anxiety 02/27/2016  . S/P total knee replacement 02/13/2014  . RBBB 02/08/2014  . HTN (hypertension) 02/08/2014  . PVD - bilateral 60-79% carotid strenosis 02/08/2014  . Mixed hyperlipidemia 11/29/2013  . Carpal tunnel syndrome, bilateral 11/23/2013  . Murmur- mild -mod AR, mild MR 11/10/2013  . Constipation 12/16/2012   HPI: at time of surgical evaluation   HPI: Mrs. Hewlett is sent for consultation regarding a right upper lobe squamous cell carcinoma.   Janalyn Higby is a 70 year old woman  with a history of tobacco abuse, right bundle branch block, mild to moderate AI, dyslipidemia, hypertension, reflux, anemia, osteoarthritis, glucose intolerance, moderate carotid disease, anxiety and depression.  She presented with expressive aphasia and right arm weakness.  She was found to have a brain metastasis with edema.  Her symptoms improved significantly with steroids.  She then underwent SRS followed by craniotomy and resection.  In addition her work-up included a CT of the chest abdomen pelvis which showed a 4.8 cm right upper lobe mass.  On PET CT the mass was hypermetabolic.  There was no mediastinal or hilar adenopathy and no evidence of distant metastases other than the isolated brain metastasis.   She had her SRS and then had a craniotomy on 08/24/2020.  She did well with that and went home the next day.  She has been walking without difficulty.  She did quit smoking prior to that admission.  She had about a 40-pack-year history of smoking prior to quitting.  She has had a 20 pound weight loss over 6 months with a 10 pound weight loss over the past 3 months.  She does complain of some decreased energy.  She has difficulty swallowing large pills but no other issues with that.  She was treated for some bleeding hemorrhoids in the past month as well.  Hospital Course:   On 09/24/2020 Ms. Neeb underwent a robotic-assisted right upper lobectomy with Dr. Roxan Hockey. The patient tolerated the procedure well and was transferred to the progressive care unit in stable condition.  Post operative CXR showed an apical space vs. Pneumothorax.  Her chest tube was placed on suction.  Her blood glucose levels were elevated due to decadron administered IV Decadron.  Her sliding  scale was adjusted accordingly.  The patient's follow up CXR showed resolution of apical space/pneumothorax.  Her chest tube was again placed on water seal.  Follow up CXR showed trace apical space.  She had no air leak present.  The  output decreased and chest tube was able to be removed on 09/27/2020.  Follow up CXR showed tiny right apical pneumo stable from the previous CXR.  Her hemoglobin level dropped post operatively.  She required transfusion of 2 units of packed cells.  Her repeat H/H was 9.6/29.9.  The patient has moved her bowels.  She is ambulating without difficulty.  Her surgical incisions show no evidence of infection.  She is medically stable for discharge home today.    Discharged Condition: good  Significant Diagnostic Studies: nuclear medicine:   IMPRESSION: 1. The previously demonstrated large right upper lobe mass is peripherally hypermetabolic, consistent with bronchogenic carcinoma. This lesion demonstrates progressive central necrosis. 2. No evidence of metastatic disease. By imaging, this corresponds with stage II B disease (T3 N0 M0). 3. Nonspecific asymmetric activity associated with the arytenoid cartilage, potentially physiologic. Correlate with direct visualization if there are symptoms referable to this region. 4. Nonspecific anorectal activity, suspected to be related to a degree of rectal prolapse. Correlate clinically. 5. Incidental findings as previously described, including cholelithiasis and Aortic Atherosclerosis (ICD10-I70.0).  Treatments: surgery:   Operative Report   DATE OF PROCEDURE: 09/24/2020  PREOPERATIVE DIAGNOSIS:  Adenocarcinoma, right upper lobe, stage IV (T3 N0 M1b).  POSTOPERATIVE DIAGNOSIS:  Adenocarcinoma, right upper lobe, stage IV (T3 N0 M1b).  PROCEDURE:  Robotic-assisted right upper lobectomy with en bloc wedge resection of right middle lobe, lymph node dissection, and intercostal nerve blocks at levels 3 through 10.  SURGEON:  Revonda Standard. Roxan Hockey, MD  ASSISTANT:  Nicholes Rough, PA  ANESTHESIA:  General.  FINDINGS:  No significant pleural effusion. No involvement of parietal pleura mass with adhesions across minor fissure.  Multiple enlarged  lymph nodes.  Bronchial margin negative for tumor.  PATHOLOGY:  PATHOLOGY SURGICAL PATHOLOGY  CASE: [id number]  PATIENT: [place]  Surgical Pathology Report      Clinical History: RUL mass (cm)      FINAL MICROSCOPIC DIAGNOSIS:   A. LUNG, RIGHT UPPER LOBE, LOBECTOMY:  - Adenosquamous carcinoma, spanning 5.5 cm.  - Tumor invades pleura.  - Resection margins are negative.  - Two of two lymph nodes negative for carcinoma (0/2).  - See oncology table.   B. LYMPH NODE, LEVEL 9, EXCISION:  - Benign fibroadipose tissue.  - No lymphoid tissue.   C. LYMPH NODE, LEVEL 7, EXCISION:  - One of one lymph nodes negative for carcinoma (0/1).   D. LYMPH NODE, LEVEL 7 #2, EXCISION:  - One of one lymph nodes negative for carcinoma (0/1).   E. LYMPH NODE, LEVEL 8, EXCISION:  - One of one lymph nodes negative for carcinoma (0/1).   F. LYMPH NODE, LEVEL 7 #3, EXCISION:  - One of one lymph nodes negative for carcinoma (0/1).   G. LYMPH NODE, LEVEL 11, EXCISION:  - One of one lymph nodes negative for carcinoma (0/1).   H. LYMPH NODE, LEVEL 11 #2, EXCISION:  - One of one lymph nodes negative for carcinoma (0/1).   I. LYMPH NODE, LEVEL 11 #3, EXCISION:  - One of one lymph nodes negative for carcinoma (0/1).   J. LYMPH NODE, LEVEL 12, EXCISION:  - One of one lymph nodes negative for carcinoma (0/1).   K. LYMPH NODE, LEVEL  12 #2, EXCISION:  - One of one lymph nodes negative for carcinoma (0/1).   L. LYMPH NODE, LEVEL 12 #3, EXCISION:  - One of one lymph nodes negative for carcinoma (0/1).   M. LYMPH NODE, LEVEL 10, EXCISION:  - One of one lymph nodes negative for carcinoma (0/1).   N. LYMPH NODE, LEVEL 10 #2, EXCISION:  - One of one lymph nodes negative for carcinoma (0/1).   O. LYMPH NODE, LEVEL 10 #3, EXCISION:  - One of one lymph nodes negative for carcinoma (0/1).   P. LYMPH NODE, LEVEL 10 #4, EXCISION:  - One of one lymph nodes negative for carcinoma (0/1).    Q. LYMPH NODE, 4R, EXCISION:  - One of one lymph nodes negative for carcinoma (0/1).   R. LYMPH NODE, 4R #2, EXCISION:  - One of one lymph nodes negative for carcinoma (0/1).   S. LYMPH NODE, 4R #3, EXCISION:  - One of one lymph nodes negative for carcinoma (0/1).   T. LYMPH NODE, LEVEL 12 #4, EXCISION:  - One of one lymph nodes negative for carcinoma (0/1).   U. LYMPH NODE, LEVEL 12 #5, EXCISION:  - One of one lymph nodes negative for carcinoma (0/1).   V. LYMPH NODE, LEVEL 10 #5, EXCISION:  - One of one lymph nodes negative for carcinoma (0/1).     Discharge Exam: Blood pressure 123/75, pulse 68, temperature 97.8 F (36.6 C), temperature source Oral, resp. rate 15, height 5' (1.524 m), weight 49.4 kg, SpO2 98 %.  General appearance: alert, cooperative and no distress Heart: regular rate and rhythm, S1, S2 normal, no murmur, click, rub or gallop Lungs: clear to auscultation bilaterally Abdomen: soft, non-tender; bowel sounds normal; no masses,  no organomegaly Extremities: extremities normal, atraumatic, no cyanosis or edema Wound: clean and dry   Disposition: Discharge disposition: 01-Home or Self Care        Allergies as of 09/28/2020      Reactions   Amlodipine Swelling, Rash   Rash, swelling   Chantix [varenicline Tartrate] Shortness Of Breath, Swelling, Other (See Comments)   Tongue swell,sob   Clarithromycin Rash   Lisinopril Hives   Simvastatin Hives   Wellbutrin [bupropion Hcl] Hives   Lipitor [atorvastatin]    Dizziness per patient   Sertraline    Makes her feel like she is going to kill someone      Medication List    TAKE these medications   acetaminophen 500 MG tablet Commonly known as: TYLENOL Take 2 tablets (1,000 mg total) by mouth every 6 (six) hours.   ALPRAZolam 0.5 MG tablet Commonly known as: XANAX Take 1 tablet (0.5 mg total) by mouth 2 (two) times daily as needed. for sleep   aspirin 81 MG tablet Take 1 tablet (81 mg total)  by mouth daily. Restart on 08/31/20   Benefiber Powd Take 1 Scoop by mouth 2 (two) times daily as needed (constipation).   carboxymethylcellulose 0.5 % Soln Commonly known as: REFRESH PLUS Place 1-2 drops into both eyes daily.   hydrochlorothiazide 12.5 MG tablet Commonly known as: HYDRODIURIL Take 1 tablet (12.5 mg total) by mouth daily.   loratadine 10 MG tablet Commonly known as: CLARITIN Take 10 mg by mouth daily as needed for allergies.   mirtazapine 15 MG tablet Commonly known as: Remeron Take 1 tablet (15 mg total) by mouth at bedtime.   neomycin-bacitracin-polymyxin ointment Commonly known as: NEOSPORIN Apply 1 application topically as needed for wound care.   omeprazole  40 MG capsule Commonly known as: PRILOSEC Take 1 capsule (40 mg total) by mouth 2 (two) times daily before a meal.   polyethylene glycol 17 g packet Commonly known as: MIRALAX / GLYCOLAX Take 17 g by mouth 2 (two) times daily.   pravastatin 40 MG tablet Commonly known as: PRAVACHOL Take 1 tablet (40 mg total) by mouth every evening.   PROBIOTIC DAILY PO Take 1 capsule by mouth daily.   senna-docusate 8.6-50 MG tablet Commonly known as: Senokot-S Take 1 tablet by mouth 2 (two) times daily. What changed:   when to take this  reasons to take this   simethicone 80 MG chewable tablet Commonly known as: MYLICON Chew 1 tablet (80 mg total) by mouth 4 (four) times daily as needed for flatulence (Bloating).   traMADol 50 MG tablet Commonly known as: ULTRAM Take 1-2 tablets (50-100 mg total) by mouth every 6 (six) hours as needed (mild pain).   vitamin C 1000 MG tablet Take 1,000 mg by mouth daily.       Follow-up Information    Melrose Nakayama, MD Follow up.   Specialty: Cardiothoracic Surgery Why: Please see discharge paperwork for follow-up appointment with surgeon.  Please obtain a chest x-ray at [facility] 1/2-hour prior to this appointment.  It is located in the same  office complex on the first floor. Contact information: [address] [place] [address] [phone]        [name], MD. Call in 1 day(s).   Specialty: Internal Medicine Contact information: [place] [address] [phone]        Minus Breeding, MD .   Specialty: Cardiology Contact information: [address] [place] [address] [phone]               Signed: Elgie Collard 09/28/2020, 7:52 AM

## 2020-09-25 NOTE — Progress Notes (Signed)
   09/24/20 1800  What Happened  Was fall witnessed? Yes  Who witnessed fall? Nathanial Rancher, NT  Patients activity before fall ambulating-assisted;bathroom-assisted  Point of contact buttocks  Was patient injured? No  Follow Up  MD notified Dr. Roxan Hockey  Time MD notified [phone]  Family notified Yes - comment  Time family notified 1715  Additional tests No  Adult Fall Risk Assessment  Risk Factor Category (scoring not indicated) Fall has occurred during this admission (document High fall risk)  Patient Fall Risk Level High fall risk  Adult Fall Risk Interventions  Required Bundle Interventions *See Row Information* High fall risk - low, moderate, and high requirements implemented  Additional Interventions Use of appropriate toileting equipment (bedpan, BSC, etc.)  Screening for Fall Injury Risk (To be completed on HIGH fall risk patients) - Assessing Need for Floor Mats  Risk For Fall Injury- Criteria for Floor Mats None identified - No additional interventions needed  Vitals  BP 114/63  MAP (mmHg) 79  BP Location Right Arm  BP Method Automatic  Patient Position (if appropriate) Lying  Pulse Rate Source Monitor  ECG Heart Rate 63  New onset of dysrhythmia? No  Resp (!) 21  Oxygen Therapy  O2 Device Room Air  Neurological  Neuro (WDL) WDL  Level of Consciousness Alert  Orientation Level Oriented X4  Cognition Appropriate at baseline  Speech Clear  Pupil Assessment  No  Musculoskeletal  Assistive Device BSC;Front wheel walker   Pt called out for assist to bathroom. NT assisted patient to bathroom; NT then noticed pts eyes rolling back; Pt became limp; NT and patient daughter assisted pt down to floor; NT hit the distress button to call for assistance. Pt then begin to open eyes and became alert; Pt was then assisted back to bed with the help of three RN's who responded to distress call.

## 2020-09-25 NOTE — Progress Notes (Signed)
Call received from lab at 1350. Hgb 6.8. Patient assessed and no change from previous. Reports no light-headedness. Surgical site does have moderate serosanguinous drainage. Dressing changed. Blood pressure soft; however, not a change since this afternoon. Lab redrawn. Awaiting results.

## 2020-09-25 NOTE — Progress Notes (Signed)
1 Day Post-Op Procedure(s) (LRB): XI ROBOTIC ASSISTED THORASCOPY-RIGHT UPPER LOBECTOMY (Right) INTERCOSTAL NERVE BLOCK (Right) LYMPH NODE DISSECTION (Right) Subjective: Didn't sleep well last night, some pain  Objective: Vital signs in last 24 hours: Temp:  [97.1 F (36.2 C)-98.6 F (37 C)] 98.1 F (36.7 C) (03/29 0441) Pulse Rate:  [56-80] 80 (03/29 0441) Cardiac Rhythm: Normal sinus rhythm;Bundle branch block (03/28 1904) Resp:  [10-25] 16 (03/29 0441) BP: (90-154)/(47-116) 102/56 (03/29 0441) SpO2:  [96 %-100 %] 99 % (03/29 0441) Arterial Line BP: (145-168)/(63-72) 168/72 (03/28 1207)  Hemodynamic parameters for last 24 hours:    Intake/Output from previous day: 03/28 0701 - 03/29 0700 In: 1851.4 [P.O.:237; I.V.:1514.4; IV Piggyback:100] Out: 1224 [Urine:595; Blood:30; Chest Tube:540] Intake/Output this shift: No intake/output data recorded.  General appearance: alert, cooperative and no distress Neurologic: intact Heart: regular rate and rhythm Lungs: slightly diminished on R, no wheezing Abdomen: normal findings: soft, non-tender Serosanguinous drainage from tube, no air leak  Lab Results: Recent Labs    09/25/20 0031 09/25/20 0331  WBC 8.4 8.8  HGB 6.8* 7.1*  HCT 21.5* 22.8*  PLT 194 188   BMET:  Recent Labs    09/25/20 0031  NA 134*  K 4.3  CL 100  CO2 25  GLUCOSE 241*  BUN 14  CREATININE 0.90  CALCIUM 8.7*    PT/INR: No results for input(s): LABPROT, INR in the last 72 hours. ABG    Component Value Date/Time   PHART 7.432 09/20/2020 1202   HCO3 24.9 09/20/2020 1202   O2SAT 98.4 09/20/2020 1202   CBG (last 3)  Recent Labs    09/24/20 2040 09/25/20 0012 09/25/20 0458  GLUCAP 166* 150* 101*    Assessment/Plan: S/P Procedure(s) (LRB): XI ROBOTIC ASSISTED THORASCOPY-RIGHT UPPER LOBECTOMY (Right) INTERCOSTAL NERVE BLOCK (Right) LYMPH NODE DISSECTION (Right) -POD # 1   Overall looks good. Pain well controlled CXR showed an apical  space/ pneumothorax, now on 20 cm of suction with no air leak. Keep on suction today Drained only 80 ml overnight Anemia secondary to ABL- drop in Hgb out of proportion to blood loss which was minimal intraop. Moderate drainage from CT initially but minimal overnight. Possible tube was kinked at some point but is not currently. Keep on suction today. CBG elevated initially, probably due to IV decadron intraop- change to [facility] and HS. Ambulate IS SCD + enoxaparin for DVTprophylaxis    LOS: 1 day    Melrose Nakayama 09/25/2020

## 2020-09-26 ENCOUNTER — Encounter (HOSPITAL_COMMUNITY): Payer: Self-pay | Admitting: Thoracic Surgery (Cardiothoracic Vascular Surgery)

## 2020-09-26 ENCOUNTER — Inpatient Hospital Stay (HOSPITAL_COMMUNITY): Payer: Medicare HMO

## 2020-09-26 LAB — COMPREHENSIVE METABOLIC PANEL
ALT: 13 U/L (ref 0–44)
AST: 21 U/L (ref 15–41)
Albumin: 2.5 g/dL — ABNORMAL LOW (ref 3.5–5.0)
Alkaline Phosphatase: 35 U/L — ABNORMAL LOW (ref 38–126)
Anion gap: 4 — ABNORMAL LOW (ref 5–15)
BUN: 17 mg/dL (ref 8–23)
CO2: 30 mmol/L (ref 22–32)
Calcium: 9 mg/dL (ref 8.9–10.3)
Chloride: 105 mmol/L (ref 98–111)
Creatinine, Ser: 0.69 mg/dL (ref 0.44–1.00)
GFR, Estimated: >60 mL/min (ref >60)
Glucose, Bld: 115 mg/dL — ABNORMAL HIGH (ref 70–99)
Potassium: 3.9 mmol/L (ref 3.5–5.1)
Sodium: 139 mmol/L (ref 135–145)
Total Bilirubin: 0.6 mg/dL (ref 0.3–1.2)
Total Protein: 5 g/dL — ABNORMAL LOW (ref 6.5–8.1)

## 2020-09-26 LAB — SURGICAL PATHOLOGY

## 2020-09-26 LAB — CBC
HCT: 19.5 % — ABNORMAL LOW (ref 36.0–46.0)
Hemoglobin: 6.3 g/dL — CL (ref 12.0–15.0)
MCH: 27.5 pg (ref 26.0–34.0)
MCHC: 32.3 g/dL (ref 30.0–36.0)
MCV: 85.2 fL (ref 80.0–100.0)
Platelets: 175 10*3/uL (ref 150–400)
RBC: 2.29 MIL/uL — ABNORMAL LOW (ref 3.87–5.11)
RDW: 13.7 % (ref 11.5–15.5)
WBC: 7.1 10*3/uL (ref 4.0–10.5)
nRBC: 0 % (ref 0.0–0.2)

## 2020-09-26 LAB — GLUCOSE, CAPILLARY
Glucose-Capillary: 106 mg/dL — ABNORMAL HIGH (ref 70–99)
Glucose-Capillary: 95 mg/dL (ref 70–99)
Glucose-Capillary: 107 mg/dL — ABNORMAL HIGH (ref 70–99)
Glucose-Capillary: 97 mg/dL (ref 70–99)

## 2020-09-26 LAB — PREPARE RBC (CROSSMATCH)

## 2020-09-26 MED ORDER — SIMETHICONE 80 MG PO CHEW: 80.0000 mg | CHEWABLE_TABLET | Freq: Four times a day (QID) | ORAL | Status: DC | PRN

## 2020-09-26 MED ORDER — SODIUM CHLORIDE 0.9% IV SOLUTION: Freq: Once | INTRAVENOUS | Status: AC

## 2020-09-26 NOTE — Consult Note (Signed)
   [facility] Inpatient Consult   09/26/2020  JEILANI GRUPE [birth year] [id number]  [facility] [ACO] Patient: [employer]   Primary Care Provider: Billey Gosling, MD, [employer], an Embedded practice, this office is listed to provide the [facility] calls and follow up.  Patient screened for less than 30 days readmission with 3 hospitalizations in the past 6 months.  Also, to assess for potential [place] Management service needs for post hospital transition.    Plan:  Continue to follow progress and disposition to assess for post hospital care management needs.    For questions contact:   Natividad Brood, RN BSN [facility] Liaison  [phone] business mobile phone Toll free office [phone]  Fax number: [phone] [email or web address]

## 2020-09-26 NOTE — Progress Notes (Addendum)
Pt Hemoglobin 6.3 @0052 . Notified provider.   Verbal order 2 units RBC.

## 2020-09-26 NOTE — Progress Notes (Addendum)
      [address]       [place] [address]             [phone]      2 Days Post-Op Procedure(s) (LRB): XI ROBOTIC ASSISTED THORASCOPY-RIGHT UPPER LOBECTOMY (Right) INTERCOSTAL NERVE BLOCK (Right) LYMPH NODE DISSECTION (Right)   Subjective:  No new complaints. Currently denies pain.  Denies N/V.  Has not yet moved her bowels, but is having a lot of gas and request gas aide to help with this.  Patient also notes she was taken off steroids by Dr. Zada Finders on 3/19.   Objective: Vital signs in last 24 hours: Temp:  [97.6 F (36.4 C)-98.7 F (37.1 C)] 97.8 F (36.6 C) (03/30 0535) Pulse Rate:  [66-98] 67 (03/30 0535) Cardiac Rhythm: Normal sinus rhythm;Bundle branch block (03/29 1900) Resp:  [15-20] 15 (03/30 0535) BP: (95-129)/(59-69) 127/69 (03/30 0535) SpO2:  [96 %-100 %] 97 % (03/30 0535)  Intake/Output from previous day: 03/29 0701 - 03/30 0700 In: 1260 [P.O.:480; I.V.:250; Blood:530] Out: 790 [Urine:600; Chest Tube:190] Intake/Output this shift: Total I/O In: -  Out: 130 [Chest Tube:130]  General appearance: alert, cooperative and no distress Heart: regular rate and rhythm Lungs: clear to auscultation bilaterally Abdomen: soft, non-tender; bowel sounds normal; no masses,  no organomegaly Extremities: extremities normal, atraumatic, no cyanosis or edema Wound: clean and dry  Lab Results: Recent Labs    09/25/20 0331 09/26/20 0052  WBC 8.8 7.1  HGB 7.1* 6.3*  HCT 22.8* 19.5*  PLT 188 175   BMET:  Recent Labs    09/25/20 0031 09/26/20 0052  NA 134* 139  K 4.3 3.9  CL 100 105  CO2 25 30  GLUCOSE 241* 115*  BUN 14 17  CREATININE 0.90 0.69  CALCIUM 8.7* 9.0    PT/INR: No results for input(s): LABPROT, INR in the last 72 hours. ABG    Component Value Date/Time   PHART 7.432 09/20/2020 1202   HCO3 24.9 09/20/2020 1202   O2SAT 98.4 09/20/2020 1202   CBG (last 3)  Recent Labs    09/25/20 1935 09/25/20 2351 09/26/20 0437   GLUCAP 143* 118* 106*    Assessment/Plan: S/P Procedure(s) (LRB): XI ROBOTIC ASSISTED THORASCOPY-RIGHT UPPER LOBECTOMY (Right) INTERCOSTAL NERVE BLOCK (Right) LYMPH NODE DISSECTION (Right)  1. CV- Sinus Bradycardia, BP stable 2. Pulm- CT placed back on suction yesterday, no air leak present, 190 cc output yesterday, CXR with improvement of right apcial space... possibly place back to water seal today will defer to Dr. Roxan Hockey 3. GI- no BM yet, taking Miralax daily, will add Simethicone as requested to help with gas 4. Post operative blood loss anemia- Hgb down to 6.6, she has blood ordered to be transfused today. Source of blood loss, ? GI workup? 5. CBGs controlled, will stop SSIP, continue diabetic diet, patient is borderline diabetic and is followed closely by her PCP 6. Lovenox for DVT 7. Dispo- patient stable, apical pneumothorax/space resolved with use of suction, no air leak present, can possibly place chest tube back on water seal today, continue current care   LOS: 2 days    Ellwood Handler, PA-C 09/26/2020 Patient seen and examined, agree with above Will try on water seal again today Path pending Transfused one unit earlier for anemia secondary to [place]. Roxan Hockey, MD [facility] [phone]

## 2020-09-27 ENCOUNTER — Inpatient Hospital Stay (HOSPITAL_COMMUNITY): Payer: Medicare HMO

## 2020-09-27 LAB — GLUCOSE, CAPILLARY
Glucose-Capillary: 110 mg/dL — ABNORMAL HIGH (ref 70–99)
Glucose-Capillary: 113 mg/dL — ABNORMAL HIGH (ref 70–99)
Glucose-Capillary: 124 mg/dL — ABNORMAL HIGH (ref 70–99)
Glucose-Capillary: 90 mg/dL (ref 70–99)
Glucose-Capillary: 99 mg/dL (ref 70–99)

## 2020-09-27 LAB — CBC
HCT: 29.9 % — ABNORMAL LOW (ref 36.0–46.0)
Hemoglobin: 9.6 g/dL — ABNORMAL LOW (ref 12.0–15.0)
MCH: 27 pg (ref 26.0–34.0)
MCHC: 32.1 g/dL (ref 30.0–36.0)
MCV: 84.2 fL (ref 80.0–100.0)
Platelets: 199 10*3/uL (ref 150–400)
RBC: 3.55 MIL/uL — ABNORMAL LOW (ref 3.87–5.11)
RDW: 15.6 % — ABNORMAL HIGH (ref 11.5–15.5)
WBC: 8.5 10*3/uL (ref 4.0–10.5)
nRBC: 0 % (ref 0.0–0.2)

## 2020-09-27 LAB — TYPE AND SCREEN
ABO/RH(D): B POS
Antibody Screen: NEGATIVE
Unit division: 0
Unit division: 0

## 2020-09-27 LAB — BPAM RBC
Blood Product Expiration Date: 202204212359
Blood Product Expiration Date: 202204212359
ISSUE DATE / TIME: 202203300241
ISSUE DATE / TIME: 202203300810
Unit Type and Rh: 7300
Unit Type and Rh: 7300

## 2020-09-27 MED ORDER — SIMETHICONE 80 MG PO CHEW: 80.0000 mg | CHEWABLE_TABLET | Freq: Four times a day (QID) | ORAL | Status: DC | PRN

## 2020-09-27 MED ORDER — SIMETHICONE 80 MG PO CHEW
80.0000 mg | CHEWABLE_TABLET | Freq: Four times a day (QID) | ORAL | Status: DC | PRN
  Administered 2020-09-27: 80 mg via ORAL
  Filled 2020-09-27: qty 1

## 2020-09-27 MED ORDER — LACTULOSE 10 GM/15ML PO SOLN
20.0000 g | Freq: Every day | ORAL | Status: DC | PRN
  Administered 2020-09-27: 20 g via ORAL
  Filled 2020-09-27: qty 30

## 2020-09-27 NOTE — Progress Notes (Signed)
      [address]       [place] [address]             [phone]      CT with minimal serosanguinous drainage since this AM, + tidal movement but no air leak with cough  Will dc chest tube  Remo Lipps C. Roxan Hockey, MD [facility] [phone]

## 2020-09-27 NOTE — Progress Notes (Signed)
Chest tube removed tolerated well. Radiology made aware about chest x-ray to be done after removal.

## 2020-09-27 NOTE — Progress Notes (Signed)
Complained of constipation  And feeling bloated. Myralax, dulcolax and lactulose given with good result.

## 2020-09-27 NOTE — Progress Notes (Addendum)
      [address]       [place] [address]             [phone]      3 Days Post-Op Procedure(s) (LRB): XI ROBOTIC ASSISTED THORASCOPY-RIGHT UPPER LOBECTOMY (Right) INTERCOSTAL NERVE BLOCK (Right) LYMPH NODE DISSECTION (Right)   Subjective:  No new complaints.  Still hasn't moved bowels.  States Miralax usually works for her at home.  She did have recent impaction and does not want to experience that again.  + ambulation  Objective: Vital signs in last 24 hours: Temp:  [97.5 F (36.4 C)-98.2 F (36.8 C)] 97.6 F (36.4 C) (03/31 0434) Pulse Rate:  [57-75] 64 (03/31 0434) Cardiac Rhythm: Normal sinus rhythm;Bundle branch block (03/30 1900) Resp:  [13-20] 18 (03/31 0434) BP: (125-156)/(68-86) 143/86 (03/31 0434) SpO2:  [95 %-99 %] 99 % (03/31 0434)  Intake/Output from previous day: 03/30 0701 - 03/31 0700 In: 555 [P.O.:240; Blood:315] Out: 440 [Urine:150; Chest Tube:290]  General appearance: alert, cooperative and no distress Heart: regular rate and rhythm Lungs: clear to auscultation bilaterally Abdomen: soft, non-tender; bowel sounds normal; no masses,  no organomegaly Extremities: extremities normal, atraumatic, no cyanosis or edema Wound: clean and dry  Lab Results: Recent Labs    09/26/20 0052 09/27/20 0038  WBC 7.1 8.5  HGB 6.3* 9.6*  HCT 19.5* 29.9*  PLT 175 199   BMET:  Recent Labs    09/25/20 0031 09/26/20 0052  NA 134* 139  K 4.3 3.9  CL 100 105  CO2 25 30  GLUCOSE 241* 115*  BUN 14 17  CREATININE 0.90 0.69  CALCIUM 8.7* 9.0    PT/INR: No results for input(s): LABPROT, INR in the last 72 hours. ABG    Component Value Date/Time   PHART 7.432 09/20/2020 1202   HCO3 24.9 09/20/2020 1202   O2SAT 98.4 09/20/2020 1202   CBG (last 3)  Recent Labs    09/26/20 2005 09/27/20 0017 09/27/20 0432  GLUCAP 97 124* 99    Assessment/Plan: S/P Procedure(s) (LRB): XI ROBOTIC ASSISTED THORASCOPY-RIGHT UPPER LOBECTOMY  (Right) INTERCOSTAL NERVE BLOCK (Right) LYMPH NODE DISSECTION (Right)  1. CV- hemodynamically stable in NSR 2. Pulm- CT on water seal, no air leak present, 290 cc output yesterday, CXR with trace apical space, sub q air remains stable.. possibly remove chest tube today 3. GI- on Miralax daily, no BM yet, Simethicone ordered to help with gas, will add Lactulose prn  4. Post operative blood loss anemia, Hgb at 9.6 this morning after transfusion 5. Lovenox for DVT prophylaxis 6. Dispo- patient stable, CT on water seal w/o air leak, CXR with trace apical space on right, sub q air stable, possibly remove chest tube today will defer to Dr. Roxan Hockey, continue bowel agents, overall patient doing well   LOS: 3 days    Ellwood Handler, PA-C 09/27/2020 Patient seen and examined, agree with above There is no air leak. CT with 440 ml reported output over past 24 hours- It looks more like 300 ml from when I saw her yesterday Will check back this afternoon and possibly remove tube Path T3N0 adenosquamous cell carcinoma Otherwise looks great  Remo Lipps C. Roxan Hockey, MD [facility] [phone]

## 2020-09-28 ENCOUNTER — Inpatient Hospital Stay (HOSPITAL_COMMUNITY): Payer: Medicare HMO

## 2020-09-28 LAB — GLUCOSE, CAPILLARY: Glucose-Capillary: 95 mg/dL (ref 70–99)

## 2020-09-28 MED ORDER — ACETAMINOPHEN 500 MG PO TABS: 1000.0000 mg | ORAL_TABLET | Freq: Four times a day (QID) | ORAL | 0 refills | Status: DC

## 2020-09-28 MED ORDER — SIMETHICONE 80 MG PO CHEW: 80.0000 mg | CHEWABLE_TABLET | Freq: Four times a day (QID) | ORAL | 0 refills | Status: AC | PRN

## 2020-09-28 MED ORDER — TRAMADOL HCL 50 MG PO TABS: 50.0000 mg | ORAL_TABLET | Freq: Four times a day (QID) | ORAL | 0 refills | Status: DC | PRN

## 2020-09-28 NOTE — Care Management Important Message (Signed)
Important Message  Patient Details  Name: MELANY WIESMAN MRN: [id number] Date of Birth: 1950/11/28   Medicare Important Message Given:  Yes   Patient left prior to IM delivery.  IM mailed to the patient home address.  Excell Neyland 09/28/2020, 1:40 PM

## 2020-09-28 NOTE — Progress Notes (Signed)
      [address]       [place] [address]             [phone]      4 Days Post-Op Procedure(s) (LRB): XI ROBOTIC ASSISTED THORASCOPY-RIGHT UPPER LOBECTOMY (Right) INTERCOSTAL NERVE BLOCK (Right) LYMPH NODE DISSECTION (Right) Subjective: She feels okay this morning, no issues overnight.   Objective: Vital signs in last 24 hours: Temp:  [97.8 F (36.6 C)-98.2 F (36.8 C)] 97.8 F (36.6 C) (04/01 0339) Pulse Rate:  [64-75] 68 (04/01 0339) Cardiac Rhythm: Normal sinus rhythm (04/01 0700) Resp:  [15-26] 15 (04/01 0339) BP: (123-150)/(72-85) 123/75 (04/01 0339) SpO2:  [97 %-99 %] 97 % (04/01 0339)    Intake/Output from previous day: 03/31 0701 - 04/01 0700 In: 870 [P.O.:870] Out: -  Intake/Output this shift: No intake/output data recorded.  General appearance: alert, cooperative and no distress Heart: regular rate and rhythm, S1, S2 normal, no murmur, click, rub or gallop Lungs: clear to auscultation bilaterally Abdomen: soft, non-tender; bowel sounds normal; no masses,  no organomegaly Extremities: extremities normal, atraumatic, no cyanosis or edema Wound: clean and dry  Lab Results: Recent Labs    09/26/20 0052 09/27/20 0038  WBC 7.1 8.5  HGB 6.3* 9.6*  HCT 19.5* 29.9*  PLT 175 199   BMET:  Recent Labs    09/26/20 0052  NA 139  K 3.9  CL 105  CO2 30  GLUCOSE 115*  BUN 17  CREATININE 0.69  CALCIUM 9.0    PT/INR: No results for input(s): LABPROT, INR in the last 72 hours. ABG    Component Value Date/Time   PHART 7.432 09/20/2020 1202   HCO3 24.9 09/20/2020 1202   O2SAT 98.4 09/20/2020 1202   CBG (last 3)  Recent Labs    09/27/20 1621 09/27/20 2144 09/28/20 0607  GLUCAP 90 110* 95    Assessment/Plan: S/P Procedure(s) (LRB): XI ROBOTIC ASSISTED THORASCOPY-RIGHT UPPER LOBECTOMY (Right) INTERCOSTAL NERVE BLOCK (Right) LYMPH NODE DISSECTION (Right)  1. CV-NSR in the 60s, BP well controlled 2. Pulm-tolerating room  air with good oxygen saturation. CXR shows right apical pneumo which is stable compared to yesterday's study.  3. Renal-creatinine 0.69, electrolytes okay 4. Anxiety-continue home xanax 5. Endo-blood glucose has been well controlled 6. + BM 7. Path T3N0 adenosquamous cell carcinoma 8. Lovenox for DVT prophylaxis  Plan: Discharge instructions reviewed with the patient. She is ready for discharge.      LOS: 4 days    Elgie Collard 09/28/2020

## 2020-09-28 NOTE — Progress Notes (Signed)
Discharged home accompanied by daughter, belongings taken home. Discharged instructions given to pt.

## 2020-10-01 ENCOUNTER — Other Ambulatory Visit: Payer: Self-pay

## 2020-10-01 DIAGNOSIS — D496 Neoplasm of unspecified behavior of brain: Secondary | ICD-10-CM

## 2020-10-01 DIAGNOSIS — C3411 Malignant neoplasm of upper lobe, right bronchus or lung: Secondary | ICD-10-CM

## 2020-10-02 ENCOUNTER — Encounter: Payer: Self-pay | Admitting: Internal Medicine

## 2020-10-02 MED ORDER — ALPRAZOLAM 0.5 MG PO TABS: 0.5000 mg | ORAL_TABLET | Freq: Three times a day (TID) | ORAL | 5 refills | Status: DC | PRN

## 2020-10-03 ENCOUNTER — Telehealth: Payer: Self-pay | Admitting: Internal Medicine

## 2020-10-03 ENCOUNTER — Telehealth: Payer: Self-pay | Admitting: *Deleted

## 2020-10-03 NOTE — Progress Notes (Signed)
  Chronic Care Management   Outreach Note  10/03/2020 Name: Hannah [name] MRN: [id number] DOB: 08/15/1950  Referred by: [name], MD Reason for referral : No chief complaint on file.   An unsuccessful telephone outreach was attempted today. The patient was referred to the pharmacist for assistance with care management and care coordination.   Follow Up Plan:   Carley Perdue [employer] Scheduler

## 2020-10-03 NOTE — Progress Notes (Signed)
  Chronic Care Management   Note  10/03/2020 Name: Hannah [name] MRN: [id number] DOB: May 08, 1951  Hannah [name] is a 70 y.o. year old female who is a primary care patient of Burns, Claudina Lick, MD. I reached out to Hannah [name] by phone today in response to a referral sent by Ms. Lina Sar PCP, [name], MD.   [name] was given information about Chronic Care Management services today including:  1. CCM service includes personalized support from designated clinical staff supervised by her physician, including individualized plan of care and coordination with other care providers 2. 24/7 contact phone numbers for assistance for urgent and routine care needs. 3. Service will only be billed when office clinical staff spend 20 minutes or more in a month to coordinate care. 4. Only one practitioner may furnish and bill the service in a calendar month. 5. The patient may stop CCM services at any time (effective at the end of the month) by phone call to the office staff.   Patient agreed to services and verbal consent obtained.   Follow up plan:   Carley Perdue [employer] Scheduler

## 2020-10-03 NOTE — Chronic Care Management (AMB) (Signed)
  Chronic Care Management   Note  10/03/2020 Name: Hannah [name] MRN: [id number] DOB: 11-Nov-1950  Hannah [name] is a 70 y.o. year old female who is a primary care patient of Burns, Claudina Lick, MD. I reached out to Hannah [name] by phone today in response to a referral sent by Ms. Lina Sar PCP, Dr. Quay Burow.     Ms. Ruppert was given information about Chronic Care Management services today including:  1. CCM service includes personalized support from designated clinical staff supervised by her physician, including individualized plan of care and coordination with other care providers 2. 24/7 contact phone numbers for assistance for urgent and routine care needs. 3. Service will only be billed when office clinical staff spend 20 minutes or more in a month to coordinate care. 4. Only one practitioner may furnish and bill the service in a calendar month. 5. The patient may stop CCM services at any time (effective at the end of the month) by phone call to the office staff. 6. The patient will be responsible for cost sharing (co-pay) of up to 20% of the service fee (after annual deductible is met).  Patient daughter Hannah [name] DPR on file verbally agreed to assistance and services provided by embedded care coordination/care management team today.  Follow up plan: Telephone appointment with care management team member scheduled for:10/10/2020  [facility]

## 2020-10-08 ENCOUNTER — Other Ambulatory Visit: Payer: Self-pay | Admitting: Thoracic Surgery (Cardiothoracic Vascular Surgery)

## 2020-10-08 DIAGNOSIS — C3411 Malignant neoplasm of upper lobe, right bronchus or lung: Secondary | ICD-10-CM

## 2020-10-09 ENCOUNTER — Other Ambulatory Visit: Payer: Self-pay

## 2020-10-09 ENCOUNTER — Ambulatory Visit
Admission: RE | Admit: 2020-10-09 | Discharge: 2020-10-09 | Disposition: A | Discharge status: Home / Self Care | Payer: Medicare HMO | Source: Ambulatory Visit | Attending: Thoracic Surgery (Cardiothoracic Vascular Surgery) | Admitting: Thoracic Surgery (Cardiothoracic Vascular Surgery)

## 2020-10-09 ENCOUNTER — Ambulatory Visit (INDEPENDENT_AMBULATORY_CARE_PROVIDER_SITE_OTHER): Payer: Self-pay | Admitting: Thoracic Surgery (Cardiothoracic Vascular Surgery)

## 2020-10-09 ENCOUNTER — Encounter: Payer: Self-pay | Admitting: Thoracic Surgery (Cardiothoracic Vascular Surgery)

## 2020-10-09 VITALS — BP 117/70 | HR 90 | Temp 97.7°F | Resp 20 | Ht 60.0 in | Wt 108.2 lb

## 2020-10-09 DIAGNOSIS — Z981 Arthrodesis status: Secondary | ICD-10-CM | POA: Diagnosis not present

## 2020-10-09 DIAGNOSIS — C3411 Malignant neoplasm of upper lobe, right bronchus or lung: Secondary | ICD-10-CM

## 2020-10-09 DIAGNOSIS — J939 Pneumothorax, unspecified: Secondary | ICD-10-CM | POA: Diagnosis not present

## 2020-10-09 DIAGNOSIS — C3490 Malignant neoplasm of unspecified part of unspecified bronchus or lung: Secondary | ICD-10-CM | POA: Diagnosis not present

## 2020-10-09 DIAGNOSIS — Z9889 Other specified postprocedural states: Secondary | ICD-10-CM

## 2020-10-09 NOTE — Progress Notes (Signed)
[address]       [place] [address]             [phone]     HPI: [name] returns for follow-up after recent right upper lobectomy  Hannah [name] is a 70 year old woman with a history of tobacco abuse, right bundle branch block, aortic insufficiency, hypertension, hyperlipidemia, reflux, anemia, osteoarthritis, glucose intolerance, anxiety, and depression.  She presented with expressive aphasia and right arm weakness.  She was found to have a brain metastasis.  She had SRS followed by resection on 08/04/2020.  That showed a poorly differentiated carcinoma.  Her work-up also revealed a 4.8 cm right upper lobe mass.  I did a robotic assisted right upper lobectomy and node dissection on 09/24/2020.  The final pathology was T3, N0, M1b.  She did well postoperatively and went home on day 4.  She does have some incisional discomfort.  She says she took Tylenol 4 times yesterday.  She has not used tramadol since she left the hospital.  She is not had any respiratory issues.  She has an appointment scheduled with Dr. Julien Nordmann on the 26th.  She has had some mood swings and some depression since surgery.  Past Medical History:  Diagnosis Date  . Allergy   . Anemia   . Anxiety   . Arthritis   . Carpal tunnel syndrome   . Constipation    senna C stool softeners help   . Depression   . Diverticulosis   . Dyslipidemia   . External hemorrhoids   . GERD (gastroesophageal reflux disease)   . Heart murmur    mild-moderate AR  . Hiatal hernia   . Hyperlipidemia    on meds   . Hypertension   . Internal hemorrhoids   . Osteoarthritis   . Pre-diabetes   . PVD (peripheral vascular disease) (HCC)    moderate carotid disease  . RBBB   . Smoker   . Vocal cord polyps     Current Outpatient Medications  Medication Sig Dispense Refill  . acetaminophen (TYLENOL) 500 MG tablet Take 2 tablets (1,000 mg total) by mouth every 6 (six) hours. 30 tablet 0  . ALPRAZolam  (XANAX) 0.5 MG tablet Take 1 tablet (0.5 mg total) by mouth 3 (three) times daily as needed for anxiety. 90 tablet 5  . Ascorbic Acid (VITAMIN C) 1000 MG tablet Take 1,000 mg by mouth daily.    Marland Kitchen aspirin 81 MG tablet Take 1 tablet (81 mg total) by mouth daily. Restart on 08/31/20 30 tablet   . carboxymethylcellulose (REFRESH PLUS) 0.5 % SOLN Place 1-2 drops into both eyes daily.    . hydrochlorothiazide (HYDRODIURIL) 12.5 MG tablet Take 1 tablet (12.5 mg total) by mouth daily. 90 tablet 1  . loratadine (CLARITIN) 10 MG tablet Take 10 mg by mouth daily as needed for allergies.    . mirtazapine (REMERON) 15 MG tablet Take 1 tablet (15 mg total) by mouth at bedtime. 30 tablet 5  . neomycin-bacitracin-polymyxin (NEOSPORIN) ointment Apply 1 application topically as needed for wound care.    Marland Kitchen omeprazole (PRILOSEC) 40 MG capsule Take 1 capsule (40 mg total) by mouth 2 (two) times daily before a meal. 180 capsule 1  . polyethylene glycol (MIRALAX / GLYCOLAX) 17 g packet Take 17 g by mouth 2 (two) times daily. 72 each 0  . pravastatin (PRAVACHOL) 40 MG tablet Take 1 tablet (40 mg total) by mouth every evening. 90 tablet  1  . Probiotic Product (PROBIOTIC DAILY PO) Take 1 capsule by mouth daily.    Marland Kitchen senna-docusate (SENOKOT-S) 8.6-50 MG tablet Take 1 tablet by mouth 2 (two) times daily. (Patient taking differently: Take 1 tablet by mouth daily as needed for mild constipation.)    . simethicone (MYLICON) 80 MG chewable tablet Chew 1 tablet (80 mg total) by mouth 4 (four) times daily as needed for flatulence (Bloating). 30 tablet 0  . traMADol (ULTRAM) 50 MG tablet Take 1-2 tablets (50-100 mg total) by mouth every 6 (six) hours as needed (mild pain). 30 tablet 0  . Wheat Dextrin (BENEFIBER) POWD Take 1 Scoop by mouth 2 (two) times daily as needed (constipation).     No current facility-administered medications for this visit.    Physical Exam BP 117/70 (BP Location: Right Arm, Patient Position: Sitting, Cuff  Size: Normal)   Pulse 90   Temp 97.7 F (36.5 C) (Skin)   Resp 20   Ht 5' (1.524 m)   Wt 108 lb 3.2 oz (49.1 kg)   SpO2 97% Comment: RA  BMI 21.81 kg/m  70 year old woman in no acute distress Alert and oriented x3 with no focal deficits Cardiac regular rate and rhythm Lungs diminished at right base but otherwise clear Incisions well-healed  Diagnostic Tests: I personally reviewed the chest x-ray.  Shows postoperative changes following right upper lobectomy  Impression: Hannah [name] is a 70 year old woman with stage IV lung cancer with a solitary metastasis to the brain.  She initially presented with aphasia and right-sided weakness.  She had SRS and then resection of a isolated brain metastasis.  Further work-up showed a right upper lobe mass with no other sites of disease.    She underwent robotic assisted right upper lobectomy and node dissection on 09/24/2020.  Her postoperative course was uncomplicated and she went home on day 4.  Currently she is doing well.  She has had some mood swings and some depression.  She has a history of anxiety and depression.  Certainly with what she has been through in the past couple of months that is understandable.  She asked when that would resolve but I cannot answer that question.  It should improve with time.  She does have some incisional pain but is only been using Tylenol.  She usually takes it 2-3 times a day but did take it every 6 hours yesterday.  She is not taking any narcotics since she left the hospital.  She may continue to use Tylenol as needed.  There are no restrictions on her activities at this point but she was cautioned to build into new activities gradually.   Plan: Follow-up with Dr. Julien Nordmann as scheduled on 10/23/2020 Return in 6 weeks with PA lateral chest x-ray  Hannah Nakayama, MD [facility] [phone]

## 2020-10-10 ENCOUNTER — Ambulatory Visit (INDEPENDENT_AMBULATORY_CARE_PROVIDER_SITE_OTHER): Payer: Medicare HMO | Admitting: *Deleted

## 2020-10-10 DIAGNOSIS — I10 Essential (primary) hypertension: Secondary | ICD-10-CM

## 2020-10-10 DIAGNOSIS — C3411 Malignant neoplasm of upper lobe, right bronchus or lung: Secondary | ICD-10-CM

## 2020-10-11 NOTE — Patient Instructions (Signed)
Visit Information   Hannah [name], it was nice talking with you and your daughter today.   Please read over the attached information, and let your care providers know if you have questions or concerns    I look forward to talking to you again for an update on Oct 30, 2020 at 1:00 pm- please be listening out for my call that day.  I will call as close to 1:00 pm as possible; I look forward to hearing about your progress.   Please don't hesitate to contact me if I can be of assistance to you before our next scheduled appointment.   Hannah Rack, RN, BSN, [facility] RN Care Coordination- [place] [phone]: direct office [phone]: mobile      PATIENT GOALS:  Goals Addressed            This Visit's Progress   . Keep Pain Under Control-Cancer Posttreatment   On track    Timeframe:  Long-Range Goal Priority:  Medium Start Date:           10/10/20                  Expected End Date:      01/10/21                 Follow Up Date 10/30/20   . Call for prescription refill 3 days before needed . Develop a personal pain management plan . Do something like read, listen to music or watch television to keep mind off pain . Plan exercise or activity when pain is best controlled . Prioritize tasks for the day . Track times pain is worst and when it is best . Track what makes the pain worse and what makes it better . Work slower and less intense when having pain   Why is this important?    Day-to-day life can be hard when you have pain.   Even a small change in emotion or a physical problem can make pain better or worse.   Coping with pain depends on how the mind and body react to pain.   Pain medicine is just one piece of the treatment puzzle.   There are many tools to help manage pain. A combination of them can be used to best meet your needs.         . Track and Manage My Blood Pressure-Hypertension   On track    Timeframe:  Long-Range  Goal Priority:  Medium Start Date:       10/10/20                      Expected End Date:   04/12/21                    Follow Up Date 10/30/20   . Great job monitoring your blood pressure regularly- keep up the great work . Continue checking blood pressure at least 3 times per week . Write blood pressure results in a log or diary- we will review your blood pressures during our follow up calls . Continue taking your medications as prescribed . Continue following a low salt heart healthy diet . As you continue recuperating form your recent surgeries, try to gradually increase your activity; pace activity with periods of rest and don't over-do activity . Contact your care providers/ doctors if you have changes or concerns about your blood pressure readings at home   Why  is this important?    You won't feel high blood pressure, but it can still hurt your blood vessels.   High blood pressure can cause heart or kidney problems. It can also cause a stroke.   Making lifestyle changes like losing a little weight or eating less salt will help.   Checking your blood pressure at home and at different times of the day can help to control blood pressure.   If the doctor prescribes medicine remember to take it the way the doctor ordered.   Call the office if you cannot afford the medicine or if there are questions about it.     Notes:        Caring for [name] health is emotional, psychological, and social well-being. Mental health is just as important as physical health. In fact, mental and physical health are connected, and you need both to be healthy. Some signs of good mental health (well-being) include:  Being able to attend to tasks at home, school, or work.  Being able to manage stress and emotions.  Practicing self-care, which may include: ? A regular exercise pattern. ? A reasonably healthy diet. ? Supportive and trusting relationships. ? The ability to relax  and calm yourself (self-calm).  Having pleasurable hobbies and activities to do.  Believing that you have meaning and purpose in your life.  Recovering and adjusting after facing challenges (resilience). You can take steps to build or strengthen these mentally healthy behaviors. There are resources and support to help you with this. Why is caring for mental health important? Caring for your mental health is a big part of staying healthy. Everyone has times when feelings, thoughts, or situations feel overwhelming. Mental health means having the skills to manage what feels overwhelming. If this sense of being overwhelmed persists, however, you might need some help. If you have some of the following signs, you may need to take better care of your mental health or seek help from a health care provider or mental health professional:  Problems with energy or focus.  Changes in eating habits.  Problems sleeping, such as sleeping too much or not enough.  Emotional distress, such as anger, sadness, depression, or anxiety.  Major changes in your relationships.  Losing interest in life or activities that you used to enjoy. If you have any of these symptoms on most days for 2 weeks or longer:  Talk with a close friend or family member about how you are feeling.  Contact your health care provider to discuss your symptoms.  Consider working with a Education officer, community. Your health care provider, family, or friends may be able to recommend a therapist. What can I do to promote emotional and mental health? Managing emotions  Learn to identify emotions and deal with them. Recognizing your emotions is the first step in learning to deal with them.  Practice ways to appropriately express feelings. Remember that you can control your feelings. They do not control you.  Practice stress management techniques, such as: ? Relaxation techniques, like breathing or muscle relaxation  exercises. ? Exercise. Regular activity can lower your stress level. ? Changing what you can change and accepting what you cannot change.  Build up your resilience so that you can recover and adjust after big problems or challenges. Practice resilient behaviors and attitudes: ? Set and focus on long-term goals. ? Develop and maintain healthy, supportive relationships. ? Learn to accept change and make the best of the situation. ?  Take care of yourself physically by eating a healthy diet, getting plenty of sleep, and exercising regularly. ? Develop self-awareness. Ask others to give feedback about how they see you. ? Practice mindfulness meditation to help you stay calm when dealing with daily challenges. ? Learn to respond to situations in healthy ways, rather than reacting with your emotions. ? Keep a positive attitude, and believe in yourself. Your view of yourself affects your mental health. ? Develop your listening and empathy skills. These will help you deal with difficult situations and communications.  Remember that emotions can be used as a good source of communication and are a great source of energy. Try to laugh and find humor in life. Sleeping  Get the right amount and quality of sleep. Sleep has a big impact on physical and mental health. To improve your sleep: ? Go to bed and wake up around the same time every day. ? Limit screen time before bedtime. This includes the use of your cell phone, TV, computer, and tablet. ? Keep your bedroom dark and cool. Activity  Exercise or do some physical activity regularly. This helps: ? Keep your body strong, especially during times of stress. ? Get rid of chemicals in your body (hormones) that build up when you are stressed. ? Build up your resilience.   Eating and drinking  Eat a healthy diet that includes whole grains, vegetables, fresh fruits, and lean proteins. If you have questions about what foods are best for you, ask your  health care provider.  Try not to turn to sweet, salty, or otherwise unhealthy foods when you are tired or unhappy. This can lead to unwanted weight gain and is not a healthy way to cope with emotions.   Where to find more information You can find more information about how to care for your mental health from:  [employer] on Mental Illness (NAMI): www.nami.[place]: https://carter.com/  Centers for Disease Control and Prevention: RapLives.dk Contact a health care provider if:  You lose interest in being with others or you do not want to leave the house.  You have a hard time completing your normal activities or you have less energy than normal.  You cannot stay focused or you have problems with memory.  You feel that your senses are heightened, and this makes you upset or concerned.  You feel nervous or have rapid mood changes.  You are sleeping or eating more or less than normal.  You question reality or you show odd behavior that disturbs you or others. Get help right away if:  You have thoughts about hurting yourself or others. If you ever feel like you may hurt yourself or others, or have thoughts about taking your own life, get help right away. You can go to your nearest emergency department or call:  Your local emergency services (911 in the [place]  A suicide crisis helpline, such as the [facility] at 684-081-3787. This is open 24 hours a day. Summary  Mental health is not just the absence of mental illness. It involves understanding your emotions and behaviors, and taking steps to cope with them in a healthy way.  If you have symptoms of mental or emotional distress, get help from family, friends, a health care provider, or a mental health professional.  Practice good mental health behaviors such as stress management skills, self-calming skills, exercise, and healthy sleeping and  eating. This information is not intended to replace advice  given to you by your health care provider. Make sure you discuss any questions you have with your health care provider. Document Revised: 12/08/2019 Document Reviewed: 12/08/2019 Elsevier Patient Education  2021 [place] and Exercise For a cancer survivor, the right exercise program can provide many benefits. It can improve overall health and can help prevent or reduce symptoms such as fatigue or depression. Exercise has also been shown to help keep certain kinds of cancer from coming back and to improve cancer survival rates. Before you begin an exercise program, make sure it is designed with your safety and needs in mind. Be sure to talk with your cancer care team about what activities and exercises are safe for you. How can I develop a safe and effective exercise plan? Meet with an exercise or rehabilitation specialist. This person can help you create an exercise plan that is right for you. Together, you should think about:  The best types of activity for you. Most people benefit from a mix of different kinds of activities, such as walking and strength training. Think about safety, health benefits, and what you prefer.  What types of activity you should avoid. For instance: ? You might need to avoid lifting more than a certain amount of weight. Ask your health care provider what weight limit is best. ? If you are at a high risk for infection, you might need to stay away from public places. ? If you recently had radiation, you might need to avoid swimming pool water because it can irritate your skin.  How often you should exercise and for how long. For most cancer survivors, 2 hours and 30 minutes of moderate exercise, such as brisk walking, per week is fine. Try to spread out the exercise time during the week.  Available resources. Look for local facilities with classes, activities, or programs that work for  your age group or interests. Health clubs in your community may also have programs that are designed for cancer survivors.  What activities you can do at home or in your neighborhood.   What types of exercise should I do? Talk with your health care provider about what types of exercise are safe for you. You may do:  Aerobic exercises. These can include walking, swimming, or bike riding.  Strength and balance exercises. These can include yoga, tai chi, or using resistance bands and light weights. What are some tips for exercising safely? The goal of any exercise is to stay active as safely as possible.  If you were not active before cancer, you may need to start out slowly and work up to the level of activity that your health care provider recommends.  If you exercised regularly before cancer, you may need to reduce the intensity of your program until you fully recover.  Make sure you stay hydrated. Drink 8-10 glasses of water a day. Drink water before, during, and after exercise.  If you experience pain while exercising, stop and rest. Talk with your health care provider about the pain before you go back to exercising.  Protect yourself from sun exposure when exercising outdoors.  Balance activity with rest, but remember that naps can interfere with nighttime sleep.   What are some other ways to be physically active? Besides an exercise program, you can find other ways to work physical activity into your day. This could include:  Gardening or mowing the lawn.  Housecleaning.  Dancing.  Playing with children.  Riding a bike. Contact a  health care provider if:  You are not sure whether an activity is safe for you.  You have any new symptoms or any changes in how you feel. Get help right away if:  You have a fever.  You have trouble breathing.  You have dizziness or a bad headache.  You have any other symptoms during exercise that your health care provider has said is an  emergency. These symptoms may represent a serious problem that is an emergency. Do not wait to see if the symptoms will go away. Get medical help right away. Call your local emergency services (911 in the [place] Do not drive yourself to the hospital. Summary  For a cancer survivor, the right exercise program can help prevent or reduce symptoms such as fatigue or depression. It may also help keep the cancer from coming back.  Talk with your cancer care team about what activities and exercises are safe for you.  Moderate exercise can include walking, swimming, or bike riding.  Find simple and enjoyable ways to work physical activity into your day. This could include gardening or dancing.  Contact a health care provider if you have any new symptoms or any changes in how you feel. This information is not intended to replace advice given to you by your health care provider. Make sure you discuss any questions you have with your health care provider. Document Revised: 05/17/2019 Document Reviewed: 05/17/2019 Elsevier Patient Education  2021 [place].   Consent to CCM Services: Ms. Somes was given information about Chronic Care Management services today including:  1. CCM service includes personalized support from designated clinical staff supervised by her physician, including individualized plan of care and coordination with other care providers 2. 24/7 contact phone numbers for assistance for urgent and routine care needs. 3. Service will only be billed when office clinical staff spend 20 minutes or more in a month to coordinate care. 4. Only one practitioner may furnish and bill the service in a calendar month. 5. The patient may stop CCM services at any time (effective at the end of the month) by phone call to the office staff. 6. The patient will be responsible for cost sharing (co-pay) of up to 20% of the service fee (after annual deductible is met).  Patient agreed to services and  verbal consent obtained.    Patient verbalizes understanding of instructions provided today and agrees to view in [place].   Telephone follow up appointment with care management team member scheduled for: Oct 30, 2020 at 1:00 pm  The patient has been provided with contact information for the care management team and has been advised to call with any health related questions or concerns.   Hannah Rack, RN, BSN, [facility] RN Care Coordination- [place] [phone]: direct office [phone]: mobile    CLINICAL CARE PLAN: Patient Care Plan: Cancer Posttreatment Phase (Adult)    Problem Identified: Pain   Priority: Medium    Long-Range Goal: Pain Managed   Start Date: 10/10/2020  Expected End Date: 01/10/2021  This Visit's Progress: On track  Priority: Medium  Note:   Current Barriers:   Recent surgery x 2 for cancer treatment- ongoing recuperation/ pain  Clinical Goal(s):  Marland Kitchen Collaboration with [name], MD regarding development and update of comprehensive plan of care as evidenced by provider attestation and co-signature . Inter-disciplinary care team collaboration (see longitudinal plan of care) Over the next 3 months, patient will: . work with  care management team to address care coordination and chronic disease management needs related to diagnosis and treatment plan of cancer with metastases to brain and pain control after 2 recent surgeries Interventions:   Evaluation of current treatment plan related to cancer treatment, post-op course, pain control, emotional health self-management and patient's adherence to plan as established by provider.  Collaboration with [name], MD regarding development and update of comprehensive plan of care as evidenced by provider attestation       and co-signature  Inter-disciplinary care team collaboration (see longitudinal plan of care) . Chart reviewed including relevant office notes,  upcoming scheduled appointments, and lab results . Discussed current  clinical condition with patient and daughter/ caregiver and confirmed no current clinical or medication concerns; confirmed patient continues to manage medications at home with minimal assistance from family members and reports adherence to medication regimen; discussed need for medication review at time of next scheduled outreach- patient/ caregiver agreeable . Discussed recent hospitalizations/ surgeries with patient and caregiver and confirmed that both have good understanding of post-op plan of care . Reviewed with patient/ caregiver recent post-op cardiothoracic surgical provider office visit yesterday; confirmed no changes/ questions; confirmed that patient's post-op incisions are healing well . Discussed with patient current pain levels, management of pain at home and confirmed that pain has been managed well thus far; discussed side effect of constipation with narcotic medications, and encouraged patient to continue using non-narcotic pain medication for pain relief when possible . Discussed with patient emotional response after recent diagnosis/ treatment/ surgery: depression screening completed, patient encouraged to continue to keep PCP/ care team updated around any changes or concerns . Discussed benefit of gradual increasing/ gentle exercise/ activity on post-op healing and recuperation . Discussed alternative pain control management strategies with patient and encouraged patient to consider trying some that she may be interested in and continue those she is already doing . Discussed importance of pacing activity with periods of rest and ensuring good sleep at night . Reviewed upcoming provider appointments with patient and confirmed that patient has plans to attend all as scheduled  Discussed plans with patient for ongoing care management follow up and provided patient with direct contact information for care management  team Self Care Activities:  . Patient verbalizes understanding of plan to continue keeping care providers updated on her progress/ and to call for any questions or concerns . Attends all scheduled provider appointments . Calls pharmacy for medication refills . Performs ADL's and iADL's independently with minimal assistance as indicated from family members Patient Goals: . Call for prescription refill 3 days before needed . Develop a personal pain management plan . Do something like read, listen to music or watch television to keep mind off pain . Plan exercise or activity when pain is best controlled . Prioritize tasks for the day . Track times pain is worst and when it is best . Track what makes the pain worse and what makes it better . Work slower and less intense when having pain  Follow Up Plan:  . Telephone follow up appointment with care management team member scheduled for: Oct 30, 2020 at 1:00 pm . The patient has been provided with contact information for the care management team and has been advised to call with any health related questions or concerns.     Patient Care Plan: Hypertension (Adult)    Problem Identified: Hypertension (Hypertension)   Priority: Medium    Long-Range Goal: Hypertension Monitored   Start Date:  10/10/2020  Expected End Date: 04/12/2021  This Visit's Progress: On track  Priority: Medium  Note:   Objective:  . Last practice recorded BP readings:  BP Readings from Last 3 Encounters:  10/09/20 117/70  09/28/20 131/69  09/20/20 112/78   . Most recent eGFR/CrCl: No results found for: EGFR  No components found for: CRCL Current Barriers:  Marland Kitchen Knowledge Deficits related to basic understanding of hypertension pathophysiology and self care management- patient/ caregiver could benefit form ongoing reinforcement of HTN management/ plan of care . Recent surgeries for new diagnosis of cancer with metastases to brain; ongoing recuperation with physical and  emotional aspects of dealing with new diagnosis while managing chronic conditions Case Manager Clinical Goal(s):  Over the next 6 months, patient will: Marland Kitchen Demonstrate ongoing adherence to prescribed treatment plan for hypertension in setting of new cancer diagnosis/ treatment plan, as evidenced by taking all medications as prescribed, monitoring and recording blood pressure as directed, adhering to low sodium/DASH diet . Demonstrate improved health management independence as evidenced by checking blood pressure as directed and notifying care providers for concerns and /or questions Interventions:  . Collaboration with [name], MD regarding development and update of comprehensive plan of care as evidenced by provider attestation and co-signature . Inter-disciplinary care team collaboration (see longitudinal plan of care) . Chart reviewed including relevant office notes, upcoming scheduled appointments, and lab results . Reinforced patient's and caregivers baseline understanding of importance of ongoing monitoring/ recording of blood pressures at home- encouraged patient and caregiver to continue monitoring and recording blood pressures at home as per baseline . Reviewed with patient and caregiver recent blood pressure readings at home: both report no concerns with recent blood pressures between 115-130/70-80 . Discussed blood pressure management during periods of pain, non-narcotic strategies to manage pain to keep blood pressures stable, including incorporating activity gradually into routine as recuperation period progresses . Confirmed patient endorses following following low salt, heart healthy diet . Reviewed upcoming provider appointments with patient and confirmed that patient has plans to attend all as scheduled: oncology office visit October 23, 2020 Self-Care Activities: . Self administers medications as prescribed- receives minimal assistance from caregivers with fillinig of weekly pill  box and reminding to take when due . Attends all scheduled provider appointments . Calls provider office for new concerns, questions, or BP outside discussed parameters . Checks BP and records as discussed . Follows a low sodium diet/DASH diet Patient Goals: Marland Kitchen Great job monitoring your blood pressure regularly- keep up the great work . Continue checking blood pressure at least 3 times per week . Write blood pressure results in a log or diary- we will review your blood pressures during our follow up calls . Continue taking your medications as prescribed . Continue following a low salt heart healthy diet . As you continue recuperating form your recent surgeries, try to gradually increase your activity; pace activity with periods of rest and don't over-do activity . Contact your care providers/ doctors if you have changes or concerns about your blood pressure readings at home Follow Up Plan:  Telephone follow up appointment with care management team member scheduled for: Oct 30, 2020 at 1:00 pm The patient has been provided with contact information for the care management team and has been advised to call with any health related questions or concerns.

## 2020-10-11 NOTE — Chronic Care Management (AMB) (Signed)
Chronic Care Management   CCM RN Visit Note  10/11/2020 Name: Hannah [name] MRN: [id number] DOB: 01-13-1951  Subjective: Hannah [name] is a 70 y.o. year old female who is a primary care patient of Burns, Claudina Lick, MD. The care management team was consulted for assistance with disease management and care coordination needs.    Engaged with patient and her daughter/ caregiver Hannah [name] by telephone for initial visit in response to provider referral for case management and/or care coordination services. Patient provides verbal consent to proceed with call with Hannah [name] present, provides verbal consent to speak with Hannah [name] "any time" in future.  Consent to Services:  The patient was given information about Chronic Care Management services 10/03/20, agreed to services, and gave verbal consent prior to initiation of services.  Please see initial visit note for detailed documentation.   Patient agreed to services and verbal consent obtained.   Assessment: Review of patient past medical history, allergies, medications, health status, including review of consultants reports, laboratory and other test data, was performed as part of comprehensive evaluation and provision of chronic care management services.   SDOH (Social Determinants of Health) assessments and interventions performed:  SDOH Interventions   Flowsheet Row Most Recent Value  SDOH Interventions   Food Insecurity Interventions Intervention Not Indicated  Financial Strain Interventions Intervention Not Indicated  Transportation Interventions Intervention Not Indicated      CCM Care Plan  Allergies  Allergen Reactions  . Amlodipine Swelling and Rash    Rash, swelling  . Chantix [Varenicline Tartrate] Shortness Of Breath, Swelling and Other (See Comments)    Tongue swell,sob  . Clarithromycin Rash  . Lisinopril Hives  . Simvastatin Hives  . Wellbutrin [Bupropion Hcl] Hives  . Lipitor [Atorvastatin]     Dizziness per patient  .  Sertraline     Makes her feel like she is going to kill someone    Outpatient Encounter Medications as of 10/10/2020  Medication Sig  . acetaminophen (TYLENOL) 500 MG tablet Take 2 tablets (1,000 mg total) by mouth every 6 (six) hours.  . ALPRAZolam (XANAX) 0.5 MG tablet Take 1 tablet (0.5 mg total) by mouth 3 (three) times daily as needed for anxiety.  . Ascorbic Acid (VITAMIN C) 1000 MG tablet Take 1,000 mg by mouth daily.  Marland Kitchen aspirin 81 MG tablet Take 1 tablet (81 mg total) by mouth daily. Restart on 08/31/20  . carboxymethylcellulose (REFRESH PLUS) 0.5 % SOLN Place 1-2 drops into both eyes daily.  . hydrochlorothiazide (HYDRODIURIL) 12.5 MG tablet Take 1 tablet (12.5 mg total) by mouth daily.  Marland Kitchen loratadine (CLARITIN) 10 MG tablet Take 10 mg by mouth daily as needed for allergies.  . mirtazapine (REMERON) 15 MG tablet Take 1 tablet (15 mg total) by mouth at bedtime.  Marland Kitchen neomycin-bacitracin-polymyxin (NEOSPORIN) ointment Apply 1 application topically as needed for wound care.  Marland Kitchen omeprazole (PRILOSEC) 40 MG capsule Take 1 capsule (40 mg total) by mouth 2 (two) times daily before a meal.  . polyethylene glycol (MIRALAX / GLYCOLAX) 17 g packet Take 17 g by mouth 2 (two) times daily.  . pravastatin (PRAVACHOL) 40 MG tablet Take 1 tablet (40 mg total) by mouth every evening.  . Probiotic Product (PROBIOTIC DAILY PO) Take 1 capsule by mouth daily.  Marland Kitchen senna-docusate (SENOKOT-S) 8.6-50 MG tablet Take 1 tablet by mouth 2 (two) times daily. (Patient taking differently: Take 1 tablet by mouth daily as needed for mild constipation.)  . simethicone (MYLICON) 80 MG chewable tablet  Chew 1 tablet (80 mg total) by mouth 4 (four) times daily as needed for flatulence (Bloating).  . traMADol (ULTRAM) 50 MG tablet Take 1-2 tablets (50-100 mg total) by mouth every 6 (six) hours as needed (mild pain).  . Wheat Dextrin (BENEFIBER) POWD Take 1 Scoop by mouth 2 (two) times daily as needed (constipation).   No  facility-administered encounter medications on file as of 10/10/2020.    Patient Active Problem List   Diagnosis Date Noted  . S/P robot-assisted surgical procedure 09/24/2020  . Aortic atherosclerosis ([place]) 09/12/2020  . Lung mass 09/04/2020  . Status post craniotomy 08/24/2020  . Brain tumor ([place]) 08/24/2020  . Hematochezia 08/19/2020  . Acute blood loss anemia   . Rectal bleeding 08/17/2020  . Primary cancer of right upper lobe of lung ([place]) 08/07/2020  . Brain metastasis ([place]) 08/06/2020  . Memory changes 07/24/2020  . Non-recurrent unilateral inguinal hernia without obstruction or gangrene 07/24/2020  . Pain of right clavicle 09/12/2019  . Nonspecific abnormal electrocardiogram (ECG) (EKG) 10/20/2018  . Educated about COVID-19 virus infection 10/20/2018  . Weight loss 07/17/2018  . Depression 07/17/2018  . Primary osteoarthritis of right knee 06/04/2018  . Bilateral carotid artery stenosis 09/24/2017  . Aortic valve sclerosis 09/24/2017  . Vocal cord polyps 09/07/2017  . Dysphagia 09/07/2017  . Diabetes mellitus without complication ([place]) 12/75/1700  . GERD (gastroesophageal reflux disease) 02/27/2016  . Anxiety 02/27/2016  . S/P total knee replacement 02/13/2014  . RBBB 02/08/2014  . HTN (hypertension) 02/08/2014  . PVD - bilateral 60-79% carotid strenosis 02/08/2014  . Mixed hyperlipidemia 11/29/2013  . Carpal tunnel syndrome, bilateral 11/23/2013  . Murmur- mild -mod AR, mild MR 11/10/2013  . Constipation 12/16/2012    Conditions to be addressed/monitored:HTN and Cancer  Care Plan : Cancer Posttreatment Phase (Adult)  Updates made by Knox Royalty, RN since 10/11/2020 12:00 AM    Problem: Pain   Priority: Medium    Long-Range Goal: Pain Managed   Start Date: 10/10/2020  Expected End Date: 01/10/2021  This Visit's Progress: On track  Priority: Medium  Note:   Current Barriers:   Recent surgery x 2 for cancer treatment- ongoing recuperation/ pain  Clinical  Goal(s):  Marland Kitchen Collaboration with [name], MD regarding development and update of comprehensive plan of care as evidenced by provider attestation and co-signature . Inter-disciplinary care team collaboration (see longitudinal plan of care) Over the next 3 months, patient will: . work with care management team to address care coordination and chronic disease management needs related to diagnosis and treatment plan of cancer with metastases to brain and pain control after 2 recent surgeries Interventions:   Evaluation of current treatment plan related to cancer treatment, post-op course, pain control, emotional health self-management and patient's adherence to plan as established by provider.  Collaboration with [name], MD regarding development and update of comprehensive plan of care as evidenced by provider attestation       and co-signature  Inter-disciplinary care team collaboration (see longitudinal plan of care) . Chart reviewed including relevant office notes, upcoming scheduled appointments, and lab results . Discussed current  clinical condition with patient and daughter/ caregiver and confirmed no current clinical or medication concerns; confirmed patient continues to manage medications at home with minimal assistance from family members and reports adherence to medication regimen; discussed need for medication review at time of next scheduled outreach- patient/ caregiver agreeable . Discussed recent hospitalizations/ surgeries with patient and caregiver and confirmed that  both have good understanding of post-op plan of care . Reviewed with patient/ caregiver recent post-op cardiothoracic surgical provider office visit yesterday; confirmed no changes/ questions; confirmed that patient's post-op incisions are healing well . Discussed with patient current pain levels, management of pain at home and confirmed that pain has been managed well thus far; discussed side effect of  constipation with narcotic medications, and encouraged patient to continue using non-narcotic pain medication for pain relief when possible . Discussed with patient emotional response after recent diagnosis/ treatment/ surgery: depression screening completed, patient encouraged to continue to keep PCP/ care team updated around any changes or concerns . Discussed benefit of gradual increasing/ gentle exercise/ activity on post-op healing and recuperation . Discussed alternative pain control management strategies with patient and encouraged patient to consider trying some that she may be interested in and continue those she is already doing . Discussed importance of pacing activity with periods of rest and ensuring good sleep at night . Reviewed upcoming provider appointments with patient and confirmed that patient has plans to attend all as scheduled  Discussed plans with patient for ongoing care management follow up and provided patient with direct contact information for care management team Self Care Activities:  . Patient verbalizes understanding of plan to continue keeping care providers updated on her progress/ and to call for any questions or concerns . Attends all scheduled provider appointments . Calls pharmacy for medication refills . Performs ADL's and iADL's independently with minimal assistance as indicated from family members Patient Goals: . Call for prescription refill 3 days before needed . Develop a personal pain management plan . Do something like read, listen to music or watch television to keep mind off pain . Plan exercise or activity when pain is best controlled . Prioritize tasks for the day . Track times pain is worst and when it is best . Track what makes the pain worse and what makes it better . Work slower and less intense when having pain  Follow Up Plan:  . Telephone follow up appointment with care management team member scheduled for: Oct 30, 2020 at 1:00 pm . The  patient has been provided with contact information for the care management team and has been advised to call with any health related questions or concerns.     Care Plan : Hypertension (Adult)  Updates made by Knox Royalty, RN since 10/11/2020 12:00 AM    Problem: Hypertension (Hypertension)   Priority: Medium    Long-Range Goal: Hypertension Monitored   Start Date: 10/10/2020  Expected End Date: 04/12/2021  This Visit's Progress: On track  Priority: Medium  Note:   Objective:  . Last practice recorded BP readings:  BP Readings from Last 3 Encounters:  10/09/20 117/70  09/28/20 131/69  09/20/20 112/78   . Most recent eGFR/CrCl: No results found for: EGFR  No components found for: CRCL Current Barriers:  Marland Kitchen Knowledge Deficits related to basic understanding of hypertension pathophysiology and self care management- patient/ caregiver could benefit form ongoing reinforcement of HTN management/ plan of care . Recent surgeries for new diagnosis of cancer with metastases to brain; ongoing recuperation with physical and emotional aspects of dealing with new diagnosis while managing chronic conditions Case Manager Clinical Goal(s):  Over the next 6 months, patient will: Marland Kitchen Demonstrate ongoing adherence to prescribed treatment plan for hypertension in setting of new cancer diagnosis/ treatment plan, as evidenced by taking all medications as prescribed, monitoring and recording blood pressure as directed, adhering to  low sodium/DASH diet . Demonstrate improved health management independence as evidenced by checking blood pressure as directed and notifying care providers for concerns and /or questions Interventions:  . Collaboration with [name], MD regarding development and update of comprehensive plan of care as evidenced by provider attestation and co-signature . Inter-disciplinary care team collaboration (see longitudinal plan of care) . Chart reviewed including relevant office notes,  upcoming scheduled appointments, and lab results . Reinforced patient's and caregivers baseline understanding of importance of ongoing monitoring/ recording of blood pressures at home- encouraged patient and caregiver to continue monitoring and recording blood pressures at home as per baseline . Reviewed with patient and caregiver recent blood pressure readings at home: both report no concerns with recent blood pressures between 115-130/70-80 . Discussed blood pressure management during periods of pain, non-narcotic strategies to manage pain to keep blood pressures stable, including incorporating activity gradually into routine as recuperation period progresses . Confirmed patient endorses following following low salt, heart healthy diet . Reviewed upcoming provider appointments with patient and confirmed that patient has plans to attend all as scheduled: oncology office visit October 23, 2020 Self-Care Activities: . Self administers medications as prescribed- receives minimal assistance from caregivers with fillinig of weekly pill box and reminding to take when due . Attends all scheduled provider appointments . Calls provider office for new concerns, questions, or BP outside discussed parameters . Checks BP and records as discussed . Follows a low sodium diet/DASH diet Patient Goals: Marland Kitchen Great job monitoring your blood pressure regularly- keep up the great work . Continue checking blood pressure at least 3 times per week . Write blood pressure results in a log or diary- we will review your blood pressures during our follow up calls . Continue taking your medications as prescribed . Continue following a low salt heart healthy diet . As you continue recuperating form your recent surgeries, try to gradually increase your activity; pace activity with periods of rest and don't over-do activity . Contact your care providers/ doctors if you have changes or concerns about your blood pressure readings at  home Follow Up Plan:  Telephone follow up appointment with care management team member scheduled for: Oct 30, 2020 at 1:00 pm The patient has been provided with contact information for the care management team and has been advised to call with any health related questions or concerns.       Plan:  Telephone follow up appointment with care management team member scheduled for:  Oct 30, 2020 at 1:00 pm  The patient has been provided with contact information for the care management team and has been advised to call with any health related questions or concerns.   [name], RN, BSN, [facility] RN Care Coordination- [place] [phone]: direct office [phone]: mobile

## 2020-10-15 ENCOUNTER — Ambulatory Visit: Payer: Medicare HMO | Admitting: *Deleted

## 2020-10-15 DIAGNOSIS — I10 Essential (primary) hypertension: Secondary | ICD-10-CM

## 2020-10-15 DIAGNOSIS — C3411 Malignant neoplasm of upper lobe, right bronchus or lung: Secondary | ICD-10-CM

## 2020-10-15 NOTE — Patient Instructions (Signed)
Visit Information  It was nice talking with you today, Hannah [name] about your Mom's recent development of blisters on her right foot.   Please read over the attached information, and start now to    I look forward to talking to you again for an update on Tuesday Oct 30, 2020 at 1:00 pm- please be listening out for my call that day.  I will call as close to 1:00 pm as possible; I look forward to hearing about your progress.   Please don't hesitate to contact me if I can be of assistance to you before our next scheduled appointment.   [name], RN, BSN, [place] [phone]: direct office [phone]: mobile     Blisters, Adult  A blister is a raised bubble of skin filled with liquid. Blisters often develop on skin that rubs or presses against another surface repeatedly (friction blister). Friction blisters can occur on any part of the body, but they usually form on the hands or the feet. Long-term pressure on that same area of skin can also cause the area of skin to become hardened. This hardened skin is called a callus. What are the causes? Besides friction, blisters can be caused by:  An injury, such as a burn.  An allergic reaction.  An infection.  Exposure to irritating chemicals. Friction blisters often occur in areas with a lot of heat and moisture. Friction blisters often result from:  Sports.  Repetitive activities.  Using tools and doing other activities without wearing gloves.  Shoes that are too tight or too loose. What are the signs or symptoms? A blister is often round and looks like a bump. It may hurt or feel itchy. Before a blister forms, your skin may:  Turn red.  Feel warm.  Itch.  Be painful to the touch. How is this diagnosed? A blister is diagnosed with a physical exam. How is this treated? Treatment usually involves protecting the area where the blister has formed until your  skin has healed. Treatments may include:  Using a bandage (dressing) to cover your blister.  Putting extra padding around and over your blister so that the blister does not rub on anything.  Applying antibiotic ointment. Most blisters break open, dry up, and go away on their own within 1-2 weeks. Blisters that are very painful may be drained before they break open on their own. If your blister is large or painful, your health care provider can drain it. Follow these instructions at home: Medicines  Take or apply over-the-counter and prescription medicines only as told by your health care provider.  If you were prescribed an antibiotic medicine, take or apply it as told by your health care provider. Do not stop using the antibiotic even if you start to feel better. Skin care  Do not pop your blister. This can cause infection.  Keep your blister clean and dry. This helps to prevent infection.  Before you swim or use a hot tub, cover your blister with a waterproof dressing.  Protect the area where your blister has formed as told by your health care provider.  Follow instructions from your health care provider about how to take care of your blister. Make sure you: ? Wash your hands with soap and water for at least 20 seconds before and after you change your dressing. If soap and water are not available, use hand sanitizer. ? Change your dressing as told by your  health care provider. Infection signs Check your blister every day for signs of infection. Check for:  More redness, swelling, or pain.  More fluid or blood.  Warmth.  Pus or a bad smell. General instructions  If you have a blister on a foot or toe, wear different shoes until your blister heals.  Avoid the activity that caused the blister until your blister heals.  Keep all follow-up visits as told by your health care provider. This is important. How is this prevented? Taking these steps can help to prevent blisters  that are caused by friction. Make sure you:  Wear comfortable shoes that fit well.  Always wear socks with shoes.  Wear extra socks or use tape, dressings, or pads over blister-prone areas as needed. You may also apply petroleum jelly under dressings in blister-prone areas.  Wear protective gear, such as gloves, when taking part in sports or activities that can cause blisters.  Wear loose-fitting, moisture-wicking clothes when taking part in sports or activities.  Use powders as needed to keep your feet dry. Contact a health care provider if:  You have more redness, swelling, or pain around your blister.  You have more fluid or blood coming from your blister.  Your blister feels warm to the touch.  You have pus or a bad smell coming from your blister.  You have a fever or chills.  Your blister gets better and then it gets worse. Summary  A blister is a raised bubble of skin filled with liquid. Blisters often develop on skin that rubs or presses against another surface repeatedly (friction blister).  Most blisters break open, dry up, and go away on their own within 1-2 weeks.  Keep your blister clean and dry. This helps to prevent infection.  Take steps to help prevent blisters that are caused by friction.  Contact a health care provider if you have signs of infection. This information is not intended to replace advice given to you by your health care provider. Make sure you discuss any questions you have with your health care provider. Document Revised: 05/05/2019 Document Reviewed: 05/05/2019 Elsevier Patient Education  2021 [employer].  PATIENT GOALS: Goals Addressed            This Visit's Progress   . Keep Pain Under Control-Cancer Posttreatment   On track    Timeframe:  Long-Range Goal Priority:  Medium Start Date:           10/10/20                  Expected End Date:      01/10/21                 Follow Up Date 10/30/20   . Call for prescription refill 3  days before needed . Develop a personal pain management plan . Do something like read, listen to music or watch television to keep mind off pain . Plan exercise or activity when pain is best controlled . Prioritize tasks for the day . Track times pain is worst and when it is best . Track what makes the pain worse and what makes it better . Work slower and less intense when having pain  . Keep a close eye on the foot blisters that you have noticed recently on your right foot- please call your PCP if you notice the areas are not improving after the care strategies discussed today, or if you believe the areas are worsening.  Wear comfortable  socks and keep the areas dry; don't over-do with friction when you clean your foot.  Avoid scrubbing the areas too hard to keep the skin from breaking open . Consider whether you think it would be helpful to follow up with Dr. Mickeal Skinner about your ongoing ability to cope with the emotions you are feeling after your recent cancer treatments  Why is this important?    Day-to-day life can be hard when you have pain.   Even a small change in emotion or a physical problem can make pain better or worse.   Coping with pain depends on how the mind and body react to pain.   Pain medicine is just one piece of the treatment puzzle.   There are many tools to help manage pain. A combination of them can be used to best meet your needs.         . Track and Manage My Blood Pressure-Hypertension   On track    Timeframe:  Long-Range Goal Priority:  Medium Start Date:       10/10/20                      Expected End Date:   04/12/21                    Follow Up Date 10/30/20   . Great job monitoring your blood pressure regularly- keep up the great work . Continue checking blood pressure at least 3 times per week . Write blood pressure results in a log or diary- we will review your blood pressures during our follow up calls . Continue taking your medications as  prescribed . Continue following a low salt heart healthy diet . As you continue recuperating form your recent surgeries, try to gradually increase your activity; pace activity with periods of rest and don't over-do activity . Contact your care providers/ doctors if you have changes or concerns about your blood pressure readings at home   Why is this important?    You won't feel high blood pressure, but it can still hurt your blood vessels.   High blood pressure can cause heart or kidney problems. It can also cause a stroke.   Making lifestyle changes like losing a little weight or eating less salt will help.   Checking your blood pressure at home and at different times of the day can help to control blood pressure.   If the doctor prescribes medicine remember to take it the way the doctor ordered.   Call the office if you cannot afford the medicine or if there are questions about it.     Notes:         Patient's caregiver verbalizes understanding of instructions provided today and agrees to view in [place].   Telephone follow up appointment with care management team member scheduled for:  The patient has been provided with contact information for the care management team and has been advised to call with any health related questions or concerns.   [name], RN, BSN, [facility] RN Care Coordination- [place] [phone]: direct office [phone]: mobile

## 2020-10-15 NOTE — Chronic Care Management (AMB) (Signed)
Chronic Care Management   CCM RN Visit Note  10/15/2020 Name: Hannah [name] MRN: [id number] DOB: January 06, 1951  Subjective: Hannah [name] is a 70 y.o. year old female who is a primary care patient of Burns, Claudina Lick, MD. The care management team was consulted for assistance with disease management and care coordination needs.    Engaged with patient' caregiver/ daughter [name] by telephone for acute follow up visit in response to provider referral for case management and/or care coordination services.   Consent to Services:  The patient was given information about Chronic Care Management services, agreed to services, and gave verbal consent prior to initiation of services.  Please see initial visit note for detailed documentation.   Patient agreed to services and verbal consent obtained.   Assessment: Review of patient past medical history, allergies, medications, health status, including review of consultants reports, laboratory and other test data, was performed as part of comprehensive evaluation and provision of chronic care management services.    CCM Care Plan  Allergies  Allergen Reactions  . Amlodipine Swelling and Rash    Rash, swelling  . Chantix [Varenicline Tartrate] Shortness Of Breath, Swelling and Other (See Comments)    Tongue swell,sob  . Clarithromycin Rash  . Lisinopril Hives  . Simvastatin Hives  . Wellbutrin [Bupropion Hcl] Hives  . Lipitor [Atorvastatin]     Dizziness per patient  . Sertraline     Makes her feel like she is going to kill someone    Outpatient Encounter Medications as of 10/15/2020  Medication Sig  . acetaminophen (TYLENOL) 500 MG tablet Take 2 tablets (1,000 mg total) by mouth every 6 (six) hours.  . ALPRAZolam (XANAX) 0.5 MG tablet Take 1 tablet (0.5 mg total) by mouth 3 (three) times daily as needed for anxiety.  . Ascorbic Acid (VITAMIN C) 1000 MG tablet Take 1,000 mg by mouth daily.  Marland Kitchen aspirin 81 MG tablet Take 1 tablet (81 mg total)  by mouth daily. Restart on 08/31/20  . carboxymethylcellulose (REFRESH PLUS) 0.5 % SOLN Place 1-2 drops into both eyes daily.  . hydrochlorothiazide (HYDRODIURIL) 12.5 MG tablet Take 1 tablet (12.5 mg total) by mouth daily.  Marland Kitchen loratadine (CLARITIN) 10 MG tablet Take 10 mg by mouth daily as needed for allergies.  . mirtazapine (REMERON) 15 MG tablet Take 1 tablet (15 mg total) by mouth at bedtime.  Marland Kitchen neomycin-bacitracin-polymyxin (NEOSPORIN) ointment Apply 1 application topically as needed for wound care.  Marland Kitchen omeprazole (PRILOSEC) 40 MG capsule Take 1 capsule (40 mg total) by mouth 2 (two) times daily before a meal.  . polyethylene glycol (MIRALAX / GLYCOLAX) 17 g packet Take 17 g by mouth 2 (two) times daily.  . pravastatin (PRAVACHOL) 40 MG tablet Take 1 tablet (40 mg total) by mouth every evening.  . Probiotic Product (PROBIOTIC DAILY PO) Take 1 capsule by mouth daily.  Marland Kitchen senna-docusate (SENOKOT-S) 8.6-50 MG tablet Take 1 tablet by mouth 2 (two) times daily. (Patient taking differently: Take 1 tablet by mouth daily as needed for mild constipation.)  . simethicone (MYLICON) 80 MG chewable tablet Chew 1 tablet (80 mg total) by mouth 4 (four) times daily as needed for flatulence (Bloating).  . traMADol (ULTRAM) 50 MG tablet Take 1-2 tablets (50-100 mg total) by mouth every 6 (six) hours as needed (mild pain).  . Wheat Dextrin (BENEFIBER) POWD Take 1 Scoop by mouth 2 (two) times daily as needed (constipation).   No facility-administered encounter medications on file as of 10/15/2020.  Patient Active Problem List   Diagnosis Date Noted  . S/P robot-assisted surgical procedure 09/24/2020  . Aortic atherosclerosis ([place]) 09/12/2020  . Lung mass 09/04/2020  . Status post craniotomy 08/24/2020  . Brain tumor ([place]) 08/24/2020  . Hematochezia 08/19/2020  . Acute blood loss anemia   . Rectal bleeding 08/17/2020  . Primary cancer of right upper lobe of lung ([place]) 08/07/2020  . Brain metastasis ([place])  08/06/2020  . Memory changes 07/24/2020  . Non-recurrent unilateral inguinal hernia without obstruction or gangrene 07/24/2020  . Pain of right clavicle 09/12/2019  . Nonspecific abnormal electrocardiogram (ECG) (EKG) 10/20/2018  . Educated about COVID-19 virus infection 10/20/2018  . Weight loss 07/17/2018  . Depression 07/17/2018  . Primary osteoarthritis of right knee 06/04/2018  . Bilateral carotid artery stenosis 09/24/2017  . Aortic valve sclerosis 09/24/2017  . Vocal cord polyps 09/07/2017  . Dysphagia 09/07/2017  . Diabetes mellitus without complication ([place]) 15/72/6203  . GERD (gastroesophageal reflux disease) 02/27/2016  . Anxiety 02/27/2016  . S/P total knee replacement 02/13/2014  . RBBB 02/08/2014  . HTN (hypertension) 02/08/2014  . PVD - bilateral 60-79% carotid strenosis 02/08/2014  . Mixed hyperlipidemia 11/29/2013  . Carpal tunnel syndrome, bilateral 11/23/2013  . Murmur- mild -mod AR, mild MR 11/10/2013  . Constipation 12/16/2012    Conditions to be addressed/monitored:HTN and Cancer   Care Plan : Cancer Posttreatment Phase (Adult)  Updates made by Knox Royalty, RN since 10/15/2020 12:00 AM    Problem: Pain   Priority: Medium    Long-Range Goal: Pain Managed   Start Date: 10/10/2020  Expected End Date: 01/10/2021  This Visit's Progress: On track  Recent Progress: On track  Priority: Medium  Note:   Current Barriers:   Recent surgery x 2 for cancer treatment- ongoing recuperation/ pain  Clinical Goal(s):  Marland Kitchen Collaboration with [name], MD regarding development and update of comprehensive plan of care as evidenced by provider attestation and co-signature . Inter-disciplinary care team collaboration (see longitudinal plan of care) Over the next 3 months, patient will: . work with care management team to address care coordination and chronic disease management needs related to diagnosis and treatment plan of cancer with metastases to brain and pain  control after 2 recent surgeries Interventions:   Evaluation of current treatment plan related to cancer treatment, post-op course, pain control, emotional health self-management and patient's adherence to plan as established by provider.  Collaboration with [name], MD regarding development and update of comprehensive plan of care as evidenced by provider attestation       and co-signature  Inter-disciplinary care team collaboration (see longitudinal plan of care) . Chart reviewed including relevant office notes, upcoming scheduled appointments, and lab results . Acute call from daughter/ caregiver [name] on [facility] DPR received: [name] reports that over last week, patient has developed blitering areas on her right foot; initially, patient clipped dead skin off and the areas experienced bleeding; this was promptly resolved at home with application of pressure/ cleaning/ basic care, however patient developed second area near little toe on right foot and is now concerned.  Basic triage completed: denies fever, pain/ discomfort, decreased mobility, open wound-- confirms skin is intact under blistering areas.  We discussed need for patient not to vigorously scrub areas as reported she has been doing; discussed need to keep areas clean, dry, with daily progression assessments by patient/ family.  Discussed need to ensure patient has proper dressing/ socks.  Discussed option of contacting podiatry  to schedule office visit and reasons to contact PCP should areas worsen.  Caregiver reports that she has been taking photos of these areas and this was encouraged- advised that if she/ patient believe the areas are worsening to send photos to Dr. Quay Burow through [facility] for MD advice; daughter agreeable.  Acute care coordination/ triage call completed with caregiver. . Discussed with caregiver patient's ongoing pain, emotionally lability: caregiver confirms "continues to get slowly better:" and we discussed option  of scheduling another appointment with Dr. Mickeal Skinner, oncology psychiatry, whom patient has seen before and was very comfortable with- this was encouraged . Reviewed upcoming provider appointments with patient and confirmed that patient has plans to attend all as scheduled  Discussed plans with patient for ongoing care management follow up and provided patient with direct contact information for care management team Self Care Activities:  . Patient verbalizes understanding of plan to continue keeping care providers updated on her progress/ and to call for any questions or concerns . Attends all scheduled provider appointments . Calls pharmacy for medication refills . Performs ADL's and iADL's independently with minimal assistance as indicated from family members Patient Goals: . Call for prescription refill 3 days before needed . Develop a personal pain management plan . Do something like read, listen to music or watch television to keep mind off pain . Plan exercise or activity when pain is best controlled . Prioritize tasks for the day . Track times pain is worst and when it is best . Track what makes the pain worse and what makes it better . Work slower and less intense when having pain  . Keep a close eye on the foot blisters that you have noticed recently on your right foot- please call your PCP if you notice the areas are not improving after the care strategies discussed today, or if you believe the areas are worsening.  Wear comfortable socks and keep the areas dry; don't over-do with friction when you clean your foot.  Avoid scrubbing the areas too hard to keep the skin from breaking open . Consider whether you think it would be helpful to follow up with Dr. Mickeal Skinner about your ongoing ability to cope with the emotions you are feeling after your recent cancer treatments Follow Up Plan:  . Telephone follow up appointment with care management team member scheduled for: Oct 30, 2020 at 1:00  pm . The patient has been provided with contact information for the care management team and has been advised to call with any health related questions or concerns.     Care Plan : Hypertension (Adult)  Updates made by Knox Royalty, RN since 10/15/2020 12:00 AM    Problem: Hypertension (Hypertension)   Priority: Medium    Long-Range Goal: Hypertension Monitored   Start Date: 10/10/2020  Expected End Date: 04/12/2021  This Visit's Progress: On track  Recent Progress: On track  Priority: Medium  Note:   Objective:  . Last practice recorded BP readings:  BP Readings from Last 3 Encounters:  10/09/20 117/70  09/28/20 131/69  09/20/20 112/78   . Most recent eGFR/CrCl: No results found for: EGFR  No components found for: CRCL Current Barriers:  Marland Kitchen Knowledge Deficits related to basic understanding of hypertension pathophysiology and self care management- patient/ caregiver could benefit form ongoing reinforcement of HTN management/ plan of care . Recent surgeries for new diagnosis of cancer with metastases to brain; ongoing recuperation with physical and emotional aspects of dealing with new diagnosis while managing  chronic conditions Case Manager Clinical Goal(s):  Over the next 6 months, patient will: Marland Kitchen Demonstrate ongoing adherence to prescribed treatment plan for hypertension in setting of new cancer diagnosis/ treatment plan, as evidenced by taking all medications as prescribed, monitoring and recording blood pressure as directed, adhering to low sodium/DASH diet . Demonstrate improved health management independence as evidenced by checking blood pressure as directed and notifying care providers for concerns and /or questions Interventions:  . Collaboration with [name], MD regarding development and update of comprehensive plan of care as evidenced by provider attestation and co-signature . Inter-disciplinary care team collaboration (see longitudinal plan of care) . Chart  reviewed including relevant office notes, upcoming scheduled appointments, and lab results . Confirmed caregiver continues monitoring blood pressures at home: denies concerns around same today; positive reinforcement provided . Reviewed upcoming provider appointments with patient and confirmed that patient has plans to attend all as scheduled: oncology office visit October 23, 2020 Self-Care Activities: . Self administers medications as prescribed- receives minimal assistance from caregivers with fillinig of weekly pill box and reminding to take when due . Attends all scheduled provider appointments . Calls provider office for new concerns, questions, or BP outside discussed parameters . Checks BP and records as discussed . Follows a low sodium diet/DASH diet Patient Goals: Marland Kitchen Great job monitoring your blood pressure regularly- keep up the great work . Continue checking blood pressure at least 3 times per week . Write blood pressure results in a log or diary- we will review your blood pressures during our follow up calls . Continue taking your medications as prescribed . Continue following a low salt heart healthy diet . As you continue recuperating form your recent surgeries, try to gradually increase your activity; pace activity with periods of rest and don't over-do activity . Contact your care providers/ doctors if you have changes or concerns about your blood pressure readings at home Follow Up Plan:  Telephone follow up appointment with care management team member scheduled for: Oct 30, 2020 at 1:00 pm The patient has been provided with contact information for the care management team and has been advised to call with any health related questions or concerns.      Plan:   Telephone follow up appointment with care management team member scheduled for:  Tuesday Oct 30, 2020 at 1:00 pm  The patient has been provided with contact information for the care management team and has been advised  to call with any health related questions or concerns.   [name], RN, BSN, [facility] RN Care Coordination- [place] [phone]: direct office [phone]: mobile

## 2020-10-16 ENCOUNTER — Encounter: Payer: Self-pay | Admitting: Internal Medicine

## 2020-10-17 NOTE — Telephone Encounter (Signed)
I spoke with pts daughter to gain clarity of her request. She shared that pt has on-going depression and anxiety and wants to know if she should follow-up with her PCP who has been prescribing her medication or come to the [place]. She denies suicide concerns or attempts.  I advised her to follow-up with her PCP for reassessment, medication adjustments or appropriate referrals. She shared the pt has an appt with her PCP tomorrow and they will have this addressed.

## 2020-10-18 ENCOUNTER — Ambulatory Visit: Payer: Medicare HMO | Admitting: *Deleted

## 2020-10-18 DIAGNOSIS — C3411 Malignant neoplasm of upper lobe, right bronchus or lung: Secondary | ICD-10-CM | POA: Diagnosis not present

## 2020-10-18 DIAGNOSIS — I10 Essential (primary) hypertension: Secondary | ICD-10-CM | POA: Diagnosis not present

## 2020-10-18 NOTE — Chronic Care Management (AMB) (Signed)
Chronic Care Management   CCM RN Visit Note  10/18/2020 Name: Hannah [name] MRN: [id number] DOB: 1950-10-22  Subjective: Hannah [name] is a 70 y.o. year old female who is a primary care patient of Burns, Claudina Lick, MD. The care management team was consulted for assistance with disease management and care coordination needs.    Engaged with patient's caregiver/ daughter Hannah [name], on [facility] by telephone for acute follow up visit in response to provider referral for case management and/or care coordination services. Hannah [name] left me an off-hours voice mail, requesting call back for assistance in scheduling outpatient psychiatry office visit with Dr. Casimiro Needle, whom patient has seen in the past, at his [address] [facility] office, as that is the only location that patient's insurance will cover; Caregiver reports Hannah [name] would like to follow up with Dr. Casimiro Needle at this office location around ongoing management of her labile emotions post- recent cancer diagnosis/ treatment.  Consent to Services:  The patient was given information about Chronic Care Management services, agreed to services, and gave verbal consent prior to initiation of services.  Please see initial visit note for detailed documentation.  Patient agreed to services and verbal consent obtained.   Assessment: Review of patient past medical history, allergies, medications, health status, including review of consultants reports, laboratory and other test data, was performed as part of comprehensive evaluation and provision of chronic care management services.   CCM Care Plan  Allergies  Allergen Reactions  . Amlodipine Swelling and Rash    Rash, swelling  . Chantix [Varenicline Tartrate] Shortness Of Breath, Swelling and Other (See Comments)    Tongue swell,sob  . Clarithromycin Rash  . Lisinopril Hives  . Simvastatin Hives  . Wellbutrin [Bupropion Hcl] Hives  . Lipitor [Atorvastatin]     Dizziness per patient  . Sertraline     Makes her  feel like she is going to kill someone    Outpatient Encounter Medications as of 10/18/2020  Medication Sig  . acetaminophen (TYLENOL) 500 MG tablet Take 2 tablets (1,000 mg total) by mouth every 6 (six) hours.  . ALPRAZolam (XANAX) 0.5 MG tablet Take 1 tablet (0.5 mg total) by mouth 3 (three) times daily as needed for anxiety.  . Ascorbic Acid (VITAMIN C) 1000 MG tablet Take 1,000 mg by mouth daily.  Marland Kitchen aspirin 81 MG tablet Take 1 tablet (81 mg total) by mouth daily. Restart on 08/31/20  . carboxymethylcellulose (REFRESH PLUS) 0.5 % SOLN Place 1-2 drops into both eyes daily.  . hydrochlorothiazide (HYDRODIURIL) 12.5 MG tablet Take 1 tablet (12.5 mg total) by mouth daily.  Marland Kitchen loratadine (CLARITIN) 10 MG tablet Take 10 mg by mouth daily as needed for allergies.  . mirtazapine (REMERON) 15 MG tablet Take 1 tablet (15 mg total) by mouth at bedtime.  Marland Kitchen neomycin-bacitracin-polymyxin (NEOSPORIN) ointment Apply 1 application topically as needed for wound care.  Marland Kitchen omeprazole (PRILOSEC) 40 MG capsule Take 1 capsule (40 mg total) by mouth 2 (two) times daily before a meal.  . polyethylene glycol (MIRALAX / GLYCOLAX) 17 g packet Take 17 g by mouth 2 (two) times daily.  . pravastatin (PRAVACHOL) 40 MG tablet Take 1 tablet (40 mg total) by mouth every evening.  . Probiotic Product (PROBIOTIC DAILY PO) Take 1 capsule by mouth daily.  Marland Kitchen senna-docusate (SENOKOT-S) 8.6-50 MG tablet Take 1 tablet by mouth 2 (two) times daily. (Patient taking differently: Take 1 tablet by mouth daily as needed for mild constipation.)  . simethicone (MYLICON) 80  MG chewable tablet Chew 1 tablet (80 mg total) by mouth 4 (four) times daily as needed for flatulence (Bloating).  . traMADol (ULTRAM) 50 MG tablet Take 1-2 tablets (50-100 mg total) by mouth every 6 (six) hours as needed (mild pain).  . Wheat Dextrin (BENEFIBER) POWD Take 1 Scoop by mouth 2 (two) times daily as needed (constipation).   No facility-administered encounter  medications on file as of 10/18/2020.    Patient Active Problem List   Diagnosis Date Noted  . S/P robot-assisted surgical procedure 09/24/2020  . Aortic atherosclerosis ([place]) 09/12/2020  . Lung mass 09/04/2020  . Status post craniotomy 08/24/2020  . Brain tumor ([place]) 08/24/2020  . Hematochezia 08/19/2020  . Acute blood loss anemia   . Rectal bleeding 08/17/2020  . Primary cancer of right upper lobe of lung ([place]) 08/07/2020  . Brain metastasis ([place]) 08/06/2020  . Memory changes 07/24/2020  . Non-recurrent unilateral inguinal hernia without obstruction or gangrene 07/24/2020  . Pain of right clavicle 09/12/2019  . Nonspecific abnormal electrocardiogram (ECG) (EKG) 10/20/2018  . Educated about COVID-19 virus infection 10/20/2018  . Weight loss 07/17/2018  . Depression 07/17/2018  . Primary osteoarthritis of right knee 06/04/2018  . Bilateral carotid artery stenosis 09/24/2017  . Aortic valve sclerosis 09/24/2017  . Vocal cord polyps 09/07/2017  . Dysphagia 09/07/2017  . Diabetes mellitus without complication ([place]) 88/28/0034  . GERD (gastroesophageal reflux disease) 02/27/2016  . Anxiety 02/27/2016  . S/P total knee replacement 02/13/2014  . RBBB 02/08/2014  . HTN (hypertension) 02/08/2014  . PVD - bilateral 60-79% carotid strenosis 02/08/2014  . Mixed hyperlipidemia 11/29/2013  . Carpal tunnel syndrome, bilateral 11/23/2013  . Murmur- mild -mod AR, mild MR 11/10/2013  . Constipation 12/16/2012    Conditions to be addressed/monitored:HTN and Cancer  Care Plan : Cancer Posttreatment Phase (Adult)  Updates made by Knox Royalty, RN since 10/18/2020 12:00 AM    Problem: Pain   Priority: Medium    Long-Range Goal: Pain Managed   Start Date: 10/10/2020  Expected End Date: 01/10/2021  This Visit's Progress: On track  Recent Progress: On track  Priority: Medium  Note:   Current Barriers:   Recent surgery x 2 for cancer treatment- ongoing recuperation/ pain  Clinical  Goal(s):  Marland Kitchen Collaboration with [name], MD regarding development and update of comprehensive plan of care as evidenced by provider attestation and co-signature . Inter-disciplinary care team collaboration (see longitudinal plan of care) Over the next 3 months, patient will: . work with care management team to address care coordination and chronic disease management needs related to diagnosis and treatment plan of cancer with metastases to brain and pain control after 2 recent surgeries Interventions:   Evaluation of current treatment plan related to cancer treatment, post-op course, pain control, emotional health self-management and patient's adherence to plan as established by provider.  Collaboration with [name], MD regarding development and update of comprehensive plan of care as evidenced by provider attestation       and co-signature  Inter-disciplinary care team collaboration (see longitudinal plan of care) . Chart reviewed including relevant office notes, upcoming scheduled appointments, and lab results . Received off-hours acute call from patient's daughter/ caregiver on [facility] DPR-- she requested assistance in obtaining a referral to the outpatient [facility] (Dr. Casimiro Needle) at [address], [address]: stated that patient's insurance will only cover visits with Dr. Casimiro Needle at this location . 09:45 am: placed care coordination outreach to [facility]  [place] and spoke with "Butch Penny;" Butch Penny agrees to speak with Dr. Casimiro Needle to request he consider adding patient to his schedule at this location; she agrees to call patient's caregiver/ daughter Hannah [name] once she speaks with Dr. Casimiro Needle.  Butch Penny confirms that IF Dr. Casimiro Needle agrees to see patient at this location, a formal referral from PCP will be required . 10:00 am: placed care coordination outreach to patient's caregiver and informed of update received from Butch Penny at [facility], as above; daughter  confirms that she prefers for patient to visit Dr. Casimiro Needle at this location so insurance will cover the visit, however adds that they will consider alternate locations if Dr. Casimiro Needle does not agree to schedule patient at [address] office location; she agrees to discuss all of this with Dr. Quay Burow tomorrow during scheduled office visit-- I will also message Dr. Kem Kays as an FYI/ reminder to address . Confirmed with caregiver that patient's pain is under control with currently prescribed treatment/ medications; continues to have pain, but therapies are helping . Confirmed with caregiver that patient's previously reported new (R) foot blisters "are no worse, some better;" and confirmed that they plan to attend PCP office visit scheduled for tomorrow and new patient appointment to podiatrist on October 25, 2020: positive reinforcement provided for patient/ caregiver acting promptly to have new blisters evaluated . Confirmed that caregiver contacted patient's insurance provider and have arranged in-home meal delivery through insurance provider: meals should begin arriving today . Reviewed upcoming provider appointments with patient and confirmed that patient has plans to attend all as scheduled: PCP office visit tomorrow, October 19, 2020; oncology office visit October 23, 2020; podiatry office vitis October 25, 2020  Discussed plans with patient for ongoing care management follow up and provided patient with direct contact information for care management team Self Care Activities:  . Patient verbalizes understanding of plan to continue keeping care providers updated on her progress/ and to call for any questions or concerns . Attends all scheduled provider appointments . Calls pharmacy for medication refills . Performs ADL's and iADL's independently with minimal assistance as indicated from family members Patient Goals: . Call for prescription refill 3 days before needed . Develop a personal pain management  plan . Do something like read, listen to music or watch television to keep mind off pain . Plan exercise or activity when pain is best controlled . Prioritize tasks for the day . Track times pain is worst and when it is best . Track what makes the pain worse and what makes it better . Work slower and less intense when having pain  . I am very glad that Ernest's pain is under control: please keep Dr. Quay Burow and the oncology team updated on her pain levels, especially if there are changes  . Glad to hear that Cyann's foot blisters are not worse-- Continue to keep a close eye on the foot blisters on your right foot-  I am glad you have made an appointment with Dr. Quay Burow.  Continue to wear comfortable socks and keep the areas dry; don't over-do with friction when you clean your foot.  Avoid scrubbing the areas too hard to keep the skin from breaking open; be sure to tell the podiatrist about your concerns when you visit on October 25, 2020 . I have contacted the outpatient behavioral health team [phone]) and they have agreed to speak with Dr. Casimiro Needle to see if he will agree to see Gaylon at the [place] [place]  [place] office-- please listen out for a call next week from the outpatient [facility] to follow up on this request.  I have made Dr. Quay Burow aware that the outpatient [facility] will need a referral if Dr. Casimiro Needle agrees to schedule appointment.  Please discuss this with Dr. Quay Burow during your office visit with her tomorrow. Follow Up Plan:  . Telephone follow up appointment with care management team member scheduled for: Oct 30, 2020 at 1:00 pm . The patient has been provided with contact information for the care management team and has been advised to call with any health related questions or concerns.     Care Plan : Hypertension (Adult)  Updates made by Knox Royalty, RN since 10/18/2020 12:00 AM    Problem: Hypertension (Hypertension)   Priority: Medium    Long-Range  Goal: Hypertension Monitored   Start Date: 10/10/2020  Expected End Date: 04/12/2021  This Visit's Progress: On track  Recent Progress: On track  Priority: Medium  Note:   Objective:  . Last practice recorded BP readings:  BP Readings from Last 3 Encounters:  10/09/20 117/70  09/28/20 131/69  09/20/20 112/78   . Most recent eGFR/CrCl: No results found for: EGFR  No components found for: CRCL Current Barriers:  Marland Kitchen Knowledge Deficits related to basic understanding of hypertension pathophysiology and self care management- patient/ caregiver could benefit form ongoing reinforcement of HTN management/ plan of care . Recent surgeries for new diagnosis of cancer with metastases to brain; ongoing recuperation with physical and emotional aspects of dealing with new diagnosis while managing chronic conditions Case Manager Clinical Goal(s):  Over the next 6 months, patient will: Marland Kitchen Demonstrate ongoing adherence to prescribed treatment plan for hypertension in setting of new cancer diagnosis/ treatment plan, as evidenced by taking all medications as prescribed, monitoring and recording blood pressure as directed, adhering to low sodium/DASH diet . Demonstrate improved health management independence as evidenced by checking blood pressure as directed and notifying care providers for concerns and /or questions Interventions:  . Collaboration with [name], MD regarding development and update of comprehensive plan of care as evidenced by provider attestation and co-signature . Inter-disciplinary care team collaboration (see longitudinal plan of care) . Chart reviewed including relevant office notes, upcoming scheduled appointments, and lab results . Re-confirmed caregiver continues monitoring blood pressures at home: denies concerns around same today; positive reinforcement provided- encouraged to continue monitoring blood pressures at home . Reviewed upcoming provider appointments with patient and  confirmed that patient has plans to attend all as scheduled: PCP office visit tomorrow, October 19, 2020; oncology office visit October 23, 2020; podiatry office vitis October 25, 2020 Self-Care Activities: . Self administers medications as prescribed- receives minimal assistance from caregivers with fillinig of weekly pill box and reminding to take when due . Attends all scheduled provider appointments . Calls provider office for new concerns, questions, or BP outside discussed parameters . Checks BP and records as discussed . Follows a low sodium diet/DASH diet Patient Goals: Marland Kitchen Great job monitoring your blood pressure regularly- keep up the great work: glad to hear that recent blood pressures at home have been stable! . Continue checking blood pressure at least 3 times per week . Write blood pressure results in a log or diary- we will review your blood pressures during our follow up calls . Continue taking your medications as prescribed . Continue following a low salt heart healthy diet . As you continue recuperating form your recent  surgeries, try to gradually increase your activity; pace activity with periods of rest and don't over-do activity . Contact your care providers/ doctors if you have changes or concerns about your blood pressure readings at home Follow Up Plan:  Telephone follow up appointment with care management team member scheduled for: Oct 30, 2020 at 1:00 pm The patient has been provided with contact information for the care management team and has been advised to call with any health related questions or concerns.       Plan: Telephone follow up appointment with caregiver/ patient and care management team member scheduled for:  Tuesday Oct 30, 2020 at 1:00 pm The patient and caregiver has been provided with contact information for the care management team and has been advised to call with any health related questions or concerns.   [name], RN, BSN, [facility] RN Care Coordination- [place] [phone]: direct office [phone]: mobile

## 2020-10-18 NOTE — Progress Notes (Signed)
Subjective:    Patient ID: Hannah [name], female    DOB: [birth year], 70 y.o.   MRN: [id number]  HPI The patient is here for follow up.  She is here with her daughter.   She continues to feel depressed and anxious.  This seems to be worse after her surgery and being on steroids.   This morning she woke up very anxious.  She feels teary.  She is waking frequently.  Her daughter states that she was sleeping well for a little while, but feels coming off of the steroids and with her surgery everything got worse again.  She feels depressed.    Fear and anxiety with doing more things -  Cooking breakfast, going to the store.  She wants to do more things and be more independent, but does seem to get anxious when she goes to do them.  She is decreased appetite, but forcing herself to eat and she has not lost weight.  Daughter ordered meals from insurance- ordered 12 meals.  May be able to get additional meals.    Medications and allergies reviewed with patient and updated if appropriate.  Patient Active Problem List   Diagnosis Date Noted  . S/P robot-assisted surgical procedure 09/24/2020  . Aortic atherosclerosis ([facility]) 09/12/2020  . Lung mass 09/04/2020  . Status post craniotomy 08/24/2020  . Brain tumor ([facility]) 08/24/2020  . Hematochezia 08/19/2020  . Acute blood loss anemia   . Rectal bleeding 08/17/2020  . Primary cancer of right upper lobe of lung ([facility]) 08/07/2020  . Brain metastasis ([facility]) 08/06/2020  . Memory changes 07/24/2020  . Non-recurrent unilateral inguinal hernia without obstruction or gangrene 07/24/2020  . Pain of right clavicle 09/12/2019  . Nonspecific abnormal electrocardiogram (ECG) (EKG) 10/20/2018  . Educated about COVID-19 virus infection 10/20/2018  . Weight loss 07/17/2018  . Depression 07/17/2018  . Primary osteoarthritis of right knee 06/04/2018  . Bilateral carotid artery stenosis 09/24/2017  . Aortic valve sclerosis 09/24/2017  . Vocal cord polyps  09/07/2017  . Dysphagia 09/07/2017  . Diabetes mellitus without complication ([facility]) 54/62/7035  . GERD (gastroesophageal reflux disease) 02/27/2016  . Anxiety 02/27/2016  . S/P total knee replacement 02/13/2014  . RBBB 02/08/2014  . HTN (hypertension) 02/08/2014  . PVD - bilateral 60-79% carotid strenosis 02/08/2014  . Mixed hyperlipidemia 11/29/2013  . Carpal tunnel syndrome, bilateral 11/23/2013  . Murmur- mild -mod AR, mild MR 11/10/2013  . Constipation 12/16/2012    Current Outpatient Medications on File Prior to Visit  Medication Sig Dispense Refill  . acetaminophen (TYLENOL) 500 MG tablet Take 2 tablets (1,000 mg total) by mouth every 6 (six) hours. 30 tablet 0  . ALPRAZolam (XANAX) 0.5 MG tablet Take 1 tablet (0.5 mg total) by mouth 3 (three) times daily as needed for anxiety. 90 tablet 5  . Ascorbic Acid (VITAMIN C) 1000 MG tablet Take 1,000 mg by mouth daily.    Marland Kitchen aspirin 81 MG tablet Take 1 tablet (81 mg total) by mouth daily. Restart on 08/31/20 30 tablet   . carboxymethylcellulose (REFRESH PLUS) 0.5 % SOLN Place 1-2 drops into both eyes daily.    . hydrochlorothiazide (HYDRODIURIL) 12.5 MG tablet Take 1 tablet (12.5 mg total) by mouth daily. 90 tablet 1  . loratadine (CLARITIN) 10 MG tablet Take 10 mg by mouth daily as needed for allergies.    . mirtazapine (REMERON) 15 MG tablet Take 1 tablet (15 mg total) by mouth at bedtime. 30 tablet 5  .  neomycin-bacitracin-polymyxin (NEOSPORIN) ointment Apply 1 application topically as needed for wound care.    Marland Kitchen omeprazole (PRILOSEC) 40 MG capsule Take 1 capsule (40 mg total) by mouth 2 (two) times daily before a meal. 180 capsule 1  . polyethylene glycol (MIRALAX / GLYCOLAX) 17 g packet Take 17 g by mouth 2 (two) times daily. 72 each 0  . pravastatin (PRAVACHOL) 40 MG tablet Take 1 tablet (40 mg total) by mouth every evening. 90 tablet 1  . Probiotic Product (PROBIOTIC DAILY PO) Take 1 capsule by mouth daily.    Marland Kitchen senna-docusate  (SENOKOT-S) 8.6-50 MG tablet Take 1 tablet by mouth 2 (two) times daily. (Patient taking differently: Take 1 tablet by mouth daily as needed for mild constipation.)    . simethicone (MYLICON) 80 MG chewable tablet Chew 1 tablet (80 mg total) by mouth 4 (four) times daily as needed for flatulence (Bloating). 30 tablet 0  . Wheat Dextrin (BENEFIBER) POWD Take 1 Scoop by mouth 2 (two) times daily as needed (constipation).    . traMADol (ULTRAM) 50 MG tablet Take 1-2 tablets (50-100 mg total) by mouth every 6 (six) hours as needed (mild pain). (Patient not taking: Reported on 10/19/2020) 30 tablet 0   No current facility-administered medications on file prior to visit.    Past Medical History:  Diagnosis Date  . Allergy   . Anemia   . Anxiety   . Arthritis   . Carpal tunnel syndrome   . Constipation    senna C stool softeners help   . Depression   . Diverticulosis   . Dyslipidemia   . External hemorrhoids   . GERD (gastroesophageal reflux disease)   . Heart murmur    mild-moderate AR  . Hiatal hernia   . Hyperlipidemia    on meds   . Hypertension   . Internal hemorrhoids   . Osteoarthritis   . Pre-diabetes   . PVD (peripheral vascular disease) ([facility])    moderate carotid disease  . RBBB   . Smoker   . Vocal cord polyps     Past Surgical History:  Procedure Laterality Date  . ABDOMINAL HYSTERECTOMY  1994  . APPLICATION OF CRANIAL NAVIGATION N/A 08/24/2020   Procedure: APPLICATION OF CRANIAL NAVIGATION;  Surgeon: Judith Part, MD;  Location: [facility];  Service: Neurosurgery;  Laterality: N/A;  . COLONOSCOPY    . COLONOSCOPY W/ POLYPECTOMY  2009  . CRANIOTOMY Left 08/24/2020   Procedure: Left Craniotomy for Tumor Resection with Brainlab;  Surgeon: Judith Part, MD;  Location: [facility];  Service: Neurosurgery;  Laterality: Left;  . HEMORRHOID SURGERY    . INTERCOSTAL NERVE BLOCK Right 09/24/2020   Procedure: INTERCOSTAL NERVE BLOCK;  Surgeon: Melrose Nakayama, MD;   Location: [facility];  Service: Thoracic;  Laterality: Right;  . JOINT REPLACEMENT    . LYMPH NODE DISSECTION Right 09/24/2020   Procedure: LYMPH NODE DISSECTION;  Surgeon: Melrose Nakayama, MD;  Location: [facility];  Service: Thoracic;  Laterality: Right;  . POLYPECTOMY    . right total knee arthroplasty     Dr. Emeterio Reeve 06-04-18  . SPINE SURGERY  08/16/2009  . TOTAL KNEE ARTHROPLASTY Left 02/13/2014   Procedure: TOTAL KNEE ARTHROPLASTY;  Surgeon: Alta [facility], MD;  Location: [facility];  Service: Orthopedics;  Laterality: Left;  . TOTAL KNEE ARTHROPLASTY Right 06/04/2018   Procedure: RIGHT TOTAL KNEE ARTHROPLASTY;  Surgeon: Dorna Leitz, MD;  Location: [facility];  Service: Orthopedics;  Laterality: Right;  Adductor Block  .  TUBAL LIGATION      Social History   Socioeconomic History  . Marital status: Married    Spouse name: Not on file  . Number of children: 2  . Years of education: Not on file  . Highest education level: Not on file  Occupational History  . Not on file  Tobacco Use  . Smoking status: Former Smoker    Packs/day: 0.25    Years: 40.00    Pack years: 10.00    Types: Cigarettes    Quit date: 07/09/2020    Years since quitting: 0.2  . Smokeless tobacco: Never Used  . Tobacco comment: PACK WILL LAST 3 days  Vaping Use  . Vaping Use: Never used  Substance and Sexual Activity  . Alcohol use: No  . Drug use: No  . Sexual activity: Not Currently  Other Topics Concern  . Not on file  Social History Narrative  . Not on file   Social Determinants of Health   Financial Resource Strain: Low Risk   . Difficulty of Paying Living Expenses: Not hard at all  Food Insecurity: No Food Insecurity  . Worried About Charity fundraiser in the Last Year: Never true  . Ran Out of Food in the Last Year: Never true  Transportation Needs: No Transportation Needs  . Lack of Transportation (Medical): No  . Lack of Transportation (Non-Medical): No  Physical Activity: Sufficiently Active  .  Days of Exercise per Week: 4 days  . Minutes of Exercise per Session: 50 min  Stress: No Stress Concern Present  . Feeling of Stress : Not at all  Social Connections: Socially Integrated  . Frequency of Communication with Friends and Family: More than three times a week  . Frequency of Social Gatherings with Friends and Family: More than three times a week  . Attends Religious Services: More than 4 times per year  . Active Member of Clubs or Organizations: Yes  . Attends Archivist Meetings: More than 4 times per year  . Marital Status: Married    Family History  Problem Relation Age of Onset  . Cancer Father   . Prostate cancer Father   . Diabetes Sister   . Cancer Sister   . Stroke Maternal Grandfather   . Colitis Maternal Aunt   . Breast cancer Maternal Aunt   . Colon cancer Neg Hx   . Colon polyps Neg Hx   . Rectal cancer Neg Hx   . Stomach cancer Neg Hx   . Esophageal cancer Neg Hx     Review of Systems  Constitutional: Positive for fatigue. Negative for fever.  Respiratory: Negative for shortness of breath.   Cardiovascular: Negative for chest pain and palpitations.  Neurological: Negative for light-headedness.  Psychiatric/Behavioral: Positive for dysphoric mood and sleep disturbance. The patient is nervous/anxious.        Objective:   Vitals:   10/19/20 0802  BP: 128/82  Pulse: 89  Temp: 98.3 F (36.8 C)  SpO2: 99%   BP Readings from Last 3 Encounters:  10/19/20 128/82  10/09/20 117/70  09/28/20 131/69   Wt Readings from Last 3 Encounters:  10/19/20 109 lb (49.4 kg)  10/09/20 108 lb 3.2 oz (49.1 kg)  09/24/20 109 lb (49.4 kg)   Body mass index is 21.29 kg/m.   Physical Exam    Constitutional: Appears well-developed and well-nourished. No distress.  HENT:  Head: Normocephalic and atraumatic.  Neck: Neck supple. No tracheal deviation present. No thyromegaly  present.  No cervical lymphadenopathy Cardiovascular: Normal rate, regular  rhythm and normal heart sounds.   No murmur heard. No carotid bruit .  No edema Pulmonary/Chest: Effort normal and breath sounds normal. No respiratory distress. No has no wheezes. No rales.  Skin: Skin is warm and dry. Not diaphoretic.  Psychiatric: Normal mood and affect. Behavior is normal.      Assessment & Plan:    See Problem List for Assessment and Plan of chronic medical problems.    This visit occurred during the SARS-CoV-2 public health emergency.  Safety protocols were in place, including screening questions prior to the visit, additional usage of staff PPE, and extensive cleaning of exam room while observing appropriate contact time as indicated for disinfecting solutions.

## 2020-10-18 NOTE — Patient Instructions (Signed)
Visit Information  Hannah [name], it was nice talking with you today for an update on Hannah [name]'s condition.   I am very gklad to hear that you have scheduled follow up visits with Dr. Quay Burow and the podiatrist.  As we discussed today, please listen out for a call next week from the [facility] team at [facility]. [address], [place] they are checking with Dr. Casimiro Needle to see if he will schedule an appointment with [facility] at that office location   I look forward to talking to you again for an update on Tuesday Oct 30, 2020 at 1:00 pm- please be listening out for my call that day.  I will call as close to 1:00 pm as possible; I look forward to hearing about your progress.   Please don't hesitate to contact me if I can be of assistance to you before our next scheduled appointment.   [name], RN, BSN, [facility] RN Care Coordination- [place] [phone]: direct office [phone]: mobile    PATIENT GOALS: Goals Addressed            This Visit's Progress   . Keep Pain Under Control-Cancer Posttreatment   On track    Timeframe:  Long-Range Goal Priority:  Medium Start Date:           10/10/20                  Expected End Date:      01/10/21                 Follow Up Date 10/30/20   . Call for prescription refill 3 days before needed . Develop a personal pain management plan . Do something like read, listen to music or watch television to keep mind off pain . Plan exercise or activity when pain is best controlled . Prioritize tasks for the day . Track times pain is worst and when it is best . Track what makes the pain worse and what makes it better . Work slower and less intense when having pain  . I am very glad that Hannah [name]'s pain is under control: please keep Dr. Quay Burow and the oncology team updated on her pain levels, especially if there are changes  . Glad to hear that Hannah [name]'s foot blisters are not worse-- Continue to keep a close eye on  the foot blisters on your right foot-  I am glad you have made an appointment with Dr. Quay Burow.  Continue to wear comfortable socks and keep the areas dry; don't over-do with friction when you clean your foot.  Avoid scrubbing the areas too hard to keep the skin from breaking open; be sure to tell the podiatrist about your concerns when you visit on October 25, 2020 . I have contacted the outpatient behavioral health team [phone]) and they have agreed to speak with Dr. Casimiro Needle to see if he will agree to see Hannah [name] at the [facility]. [facility] office-- please listen out for a call next week from the outpatient [facility] to follow up on this request.  I have made Dr. Quay Burow aware that the outpatient [facility] will need a referral if Dr. Casimiro Needle agrees to schedule appointment.  Please discuss this with Dr. Quay Burow during your office visit with her tomorrow.  Why is this important?    Day-to-day life can be hard when you have pain.   Even a small change in emotion or  a physical problem can make pain better or worse.   Coping with pain depends on how the mind and body react to pain.   Pain medicine is just one piece of the treatment puzzle.   There are many tools to help manage pain. A combination of them can be used to best meet your needs.         . Track and Manage My Blood Pressure-Hypertension   On track    Timeframe:  Long-Range Goal Priority:  Medium Start Date:       10/10/20                      Expected End Date:   04/12/21                    Follow Up Date 10/30/20   . Great job monitoring your blood pressure regularly- keep up the great work: glad to hear that recent blood pressures at home have been stable! . Continue checking blood pressure at least 3 times per week . Write blood pressure results in a log or diary- we will review your blood pressures during our follow up calls . Continue taking your medications as prescribed . Continue following a low salt  heart healthy diet . As you continue recuperating form your recent surgeries, try to gradually increase your activity; pace activity with periods of rest and don't over-do activity . Contact your care providers/ doctors if you have changes or concerns about your blood pressure readings at home   Why is this important?    You won't feel high blood pressure, but it can still hurt your blood vessels.   High blood pressure can cause heart or kidney problems. It can also cause a stroke.   Making lifestyle changes like losing a little weight or eating less salt will help.   Checking your blood pressure at home and at different times of the day can help to control blood pressure.   If the doctor prescribes medicine remember to take it the way the doctor ordered.   Call the office if you cannot afford the medicine or if there are questions about it.           Cancer Survivorship and Stress Management Being diagnosed with cancer is very stressful. As a cancer survivor, you are likely facing a lot of health and lifestyle changes. How you handle the stress that comes with these challenges can make a big difference in your health and well-being. What types of stress are common for a cancer survivor? Cancer survivors may experience many types of stress: Emotional stress You may have a wide range of emotions. You may:  Feel thankful and relieved.  Feel angry, afraid, guilty, or depressed.  Have survivor guilt. This is when you feel guilty that you survived when others with cancer did not.  Have post-traumatic stress disorder (PTSD). This is an anxiety disorder that can affect people who have survived a life-threatening event. Your caregivers and loved ones may get this disorder too. Financial stress Financial hardship may come from:  Lost income from time off work.  Treatment costs, especially for people who lack health insurance coverage.  The cost of nursing care or child care if  family support is lacking. Interpersonal stress Stress can affect the whole family in these ways:  Family and friends may feel helpless.  Spouses or others often take on more duties.  Children may worry about you and  the family's future.  Your own stress may change the way you interact with others. Work-related stress Going back to work may cause stress in these ways:  It takes planning and can be tiring.  Co-workers may not know what to say, or they may wonder how your return will affect them.  Answering questions about your cancer and treatment may make you feel uncomfortable. Why is it important to manage stress? When you experience high levels of stress over a long period, it can affect your mind and body. It may affect your body's ability to heal properly and to fight off illness. It can also affect how your body responds to cancer treatments. Stress management can lower levels of depression and anxiety. It may also improve the body's ability to heal. How can I effectively manage stress? The following behaviors may help manage the various types of stress you may be experiencing: Managing emotional stress Talk with your health care provider about your stress and if it might make sense to see a mental health provider. These specialists can help identify ways to deal with your stress. You can also try:  Relaxation techniques, such as meditation or deep breathing.  Medicines for depression or anxiety.  Writing about your feelings. Keeping a journal can help you let go of worries and fears.  Exercise or other physical activities. The following options may be helpful: ? Walking for a few minutes each day. ? Dancing, gardening, or other enjoyable activities. ? Strength training. ? Yoga or tai chi.   Managing financial stress  Talk with the hospital's social worker, business office, or Chief Strategy Officer. They may be able to help you set up a payment plan or identify resources  that can help with transportation, home care, and other expenses.  Discuss treatment with your insurance company in order to understand your costs. Ask for a case manager who can help you with decisions and questions you may have.  Talk with your health care provider about generic medicines, patient assistance programs, and other measures that can help reduce costs in your treatment plan.  Stay on track with your follow-up plan of care to help avoid more health problems and hospital stays.  Reach out to your bank and the companies you owe. They may be able to set up payment plans and other options during financial hardship.  Look for support services that can help you manage financially. A good place to find resources is the [employer] website: supportorgs.cancer.gov  Do your best to stay organized. This can help to avoid missing important payments and the emotional anxiety that comes with chaos. Managing interpersonal stress  Accept help when it is offered. Make a list of things you might need help with, and let others choose what they can do.  Ask for help if you need it. Friends and family may want to help, but they do not always know what you need. You can also seek help from organizations in the community, like churches or fraternal organizations, to take pressure off your loved ones.  Stay involved by doing as much as you can for yourself, with approval from your health care provider.  Let your caregivers, family, and friends know that you are thankful for them and their help.  Stay as socially active as possible. Support can come from many different people. Managing work-related stress  Do not take on too much too soon. Talk with your employer about options such as part-time hours, job sharing, unpaid time  off, or frequent breaks to take medicine or to see a health care provider. You may be protected by laws for disabled employees.  Let trusted co-workers know what  you need from them. Keep your explanation simple and positive so your co-workers feel more at ease.  If you are not yet comfortable with certain questions or comments, politely say so. Set appropriate limits on who or what they need to know. Contact a health care provider if:  Your stress is overwhelming.  You would like a referral to a mental health specialist or other resources.  You need help with transportation, home care, or other needs. Your health care team may know of resources in the community. Get help right away if:  You have a sense of panic, depression, or anxiety. If you ever feel like you may hurt yourself or others, or have thoughts about taking your own life, get help right away. Go to your nearest emergency department or:  Call your local emergency services (911 in the [place]  Call a suicide crisis helpline, such as the [facility] at (972)551-7042. This is open 24 hours a day in the [place]  Text the Crisis Text Line at 281-290-4316 (in the [place].). Summary  Recovering from cancer is stressful. Using coping strategies may make a difference.  Your finances, relationships, and work life will change, and you may need help and time to adjust.  Seek and accept help from your health care provider, mental health professionals, loved ones, and community resources if needed.  Do what you can for yourself, and let others know that you are thankful for their help.  If you feel overwhelmed by stress, sadness, or anxiety, get professional help right away. This information is not intended to replace advice given to you by your health care provider. Make sure you discuss any questions you have with your health care provider. Document Revised: 05/11/2019 Document Reviewed: 05/11/2019 Elsevier Patient Education  2021 [place]:   Patient verbalizes understanding of instructions provided today and agrees to view in [place].   Telephone follow up  appointment with patient/ caregiver and care management team member scheduled for: Oct 30, 2020 at 1:00 pm  The patient/ caregiver has been provided with contact information for the care management team and has been advised to call with any health related questions or concerns.   [name], RN, BSN, [facility] RN Care Coordination- [place] [phone]: direct office [phone]: mobile

## 2020-10-19 ENCOUNTER — Encounter: Payer: Self-pay | Admitting: Internal Medicine

## 2020-10-19 ENCOUNTER — Inpatient Hospital Stay: Payer: Medicare HMO | Admitting: Internal Medicine

## 2020-10-19 ENCOUNTER — Ambulatory Visit (INDEPENDENT_AMBULATORY_CARE_PROVIDER_SITE_OTHER): Payer: Medicare HMO | Admitting: Internal Medicine

## 2020-10-19 ENCOUNTER — Other Ambulatory Visit: Payer: Self-pay

## 2020-10-19 VITALS — BP 128/82 | HR 89 | Temp 98.3°F | Ht 60.0 in | Wt 109.0 lb

## 2020-10-19 DIAGNOSIS — G479 Sleep disorder, unspecified: Secondary | ICD-10-CM | POA: Diagnosis not present

## 2020-10-19 DIAGNOSIS — F419 Anxiety disorder, unspecified: Secondary | ICD-10-CM | POA: Diagnosis not present

## 2020-10-19 DIAGNOSIS — F3289 Other specified depressive episodes: Secondary | ICD-10-CM | POA: Diagnosis not present

## 2020-10-19 DIAGNOSIS — D649 Anemia, unspecified: Secondary | ICD-10-CM | POA: Diagnosis not present

## 2020-10-19 LAB — CBC WITH DIFFERENTIAL/PLATELET
Basophils Absolute: 0.1 10*3/uL (ref 0.0–0.1)
Basophils Relative: 1.2 % (ref 0.0–3.0)
Eosinophils Absolute: 0.5 10*3/uL (ref 0.0–0.7)
Eosinophils Relative: 5.5 % — ABNORMAL HIGH (ref 0.0–5.0)
HCT: 34.8 % — ABNORMAL LOW (ref 36.0–46.0)
Hemoglobin: 11.4 g/dL — ABNORMAL LOW (ref 12.0–15.0)
Lymphocytes Relative: 22.4 % (ref 12.0–46.0)
Lymphs Abs: 1.9 10*3/uL (ref 0.7–4.0)
MCHC: 32.7 g/dL (ref 30.0–36.0)
MCV: 80.6 fl (ref 78.0–100.0)
Monocytes Absolute: 0.9 10*3/uL (ref 0.1–1.0)
Monocytes Relative: 10.8 % (ref 3.0–12.0)
Neutro Abs: 5.1 10*3/uL (ref 1.4–7.7)
Neutrophils Relative %: 60.1 % (ref 43.0–77.0)
Platelets: 401 10*3/uL — ABNORMAL HIGH (ref 150.0–400.0)
RBC: 4.32 Mil/uL (ref 3.87–5.11)
RDW: 15 % (ref 11.5–15.5)
WBC: 8.5 10*3/uL (ref 4.0–10.5)

## 2020-10-19 MED ORDER — CLONAZEPAM 0.5 MG PO TABS: 0.5000 mg | ORAL_TABLET | Freq: Two times a day (BID) | ORAL | 1 refills | Status: DC | PRN

## 2020-10-19 MED ORDER — MIRTAZAPINE 30 MG PO TABS: 30.0000 mg | ORAL_TABLET | Freq: Every day | ORAL | 5 refills | Status: DC

## 2020-10-19 NOTE — Patient Instructions (Addendum)
  Blood work was ordered.     Medications changes include :   Stop the xanax and start clonazepam 0.5 mg twice daily.  Increase remeron to 30 mg nightly.   Your prescription(s) have been submitted to your pharmacy. Please take as directed and contact our office if you believe you are having problem(s) with the medication(s).  A referral was ordered for Dr Casimiro Needle.    Follow up in September as scheduled, sooner if needed

## 2020-10-19 NOTE — Assessment & Plan Note (Signed)
Chronic Not ideally controlled The steroids, anesthesia, surgery and everything she has been through all likely contributing Discussed increasing Remeron versus considering restarting Lexapro.  We will try increasing alert Remeron first since she is already on it, but if this is not effective we will consider changing this to Lexapro Referral order for Dr. Casimiro Needle who she has seen in the past

## 2020-10-19 NOTE — Assessment & Plan Note (Signed)
Acute Likely related to recent surgery-acute blood loss She did receive transfusion while in the hospital and blood counts responded as expected Will recheck CBC today to make sure that they have continued to improve

## 2020-10-19 NOTE — Assessment & Plan Note (Signed)
Chronic Not ideally controlled She is still very anxious, which is likely multifactorial Will try discontinuing the alprazolam and starting clonazepam-hopefully the longer acting benzodiazepine will be more effective Start clonazepam 0.5 mg twice daily Discussed increasing Remeron versus considering restarting Lexapro.  We will try increasing alert Remeron first since she is already on it, but if this is not effective we will consider changing this to Lexapro Referral order for Dr. Casimiro Needle who she has seen in the past

## 2020-10-19 NOTE — Assessment & Plan Note (Signed)
The Remeron did help some with her sleep, but last night she did not sleep good, which her daughter feels was related to coming here today we will continue Remeron, but increase to 30 mg nightly

## 2020-10-21 MED ORDER — CLONAZEPAM 0.5 MG PO TABS: 0.5000 mg | ORAL_TABLET | Freq: Two times a day (BID) | ORAL | 1 refills | Status: DC | PRN

## 2020-10-23 ENCOUNTER — Inpatient Hospital Stay: Payer: Medicare HMO | Admitting: Internal Medicine

## 2020-10-23 ENCOUNTER — Other Ambulatory Visit: Payer: Self-pay

## 2020-10-23 ENCOUNTER — Inpatient Hospital Stay: Payer: Medicare HMO | Attending: Internal Medicine

## 2020-10-23 VITALS — BP 136/77 | HR 87 | Temp 98.1°F | Resp 18 | Ht 60.0 in | Wt 110.7 lb

## 2020-10-23 DIAGNOSIS — M47814 Spondylosis without myelopathy or radiculopathy, thoracic region: Secondary | ICD-10-CM | POA: Diagnosis not present

## 2020-10-23 DIAGNOSIS — Z902 Acquired absence of lung [part of]: Secondary | ICD-10-CM | POA: Insufficient documentation

## 2020-10-23 DIAGNOSIS — E785 Hyperlipidemia, unspecified: Secondary | ICD-10-CM | POA: Diagnosis not present

## 2020-10-23 DIAGNOSIS — Z5112 Encounter for antineoplastic immunotherapy: Secondary | ICD-10-CM | POA: Diagnosis not present

## 2020-10-23 DIAGNOSIS — C7931 Secondary malignant neoplasm of brain: Secondary | ICD-10-CM | POA: Insufficient documentation

## 2020-10-23 DIAGNOSIS — Z888 Allergy status to other drugs, medicaments and biological substances status: Secondary | ICD-10-CM | POA: Insufficient documentation

## 2020-10-23 DIAGNOSIS — I517 Cardiomegaly: Secondary | ICD-10-CM | POA: Diagnosis not present

## 2020-10-23 DIAGNOSIS — R5383 Other fatigue: Secondary | ICD-10-CM | POA: Insufficient documentation

## 2020-10-23 DIAGNOSIS — Z8719 Personal history of other diseases of the digestive system: Secondary | ICD-10-CM | POA: Insufficient documentation

## 2020-10-23 DIAGNOSIS — K219 Gastro-esophageal reflux disease without esophagitis: Secondary | ICD-10-CM | POA: Diagnosis not present

## 2020-10-23 DIAGNOSIS — C3411 Malignant neoplasm of upper lobe, right bronchus or lung: Secondary | ICD-10-CM

## 2020-10-23 DIAGNOSIS — I10 Essential (primary) hypertension: Secondary | ICD-10-CM | POA: Insufficient documentation

## 2020-10-23 DIAGNOSIS — Z79899 Other long term (current) drug therapy: Secondary | ICD-10-CM | POA: Insufficient documentation

## 2020-10-23 DIAGNOSIS — Z5111 Encounter for antineoplastic chemotherapy: Secondary | ICD-10-CM

## 2020-10-23 LAB — CMP (CANCER CENTER ONLY)
ALT: <6 U/L (ref 0–44)
AST: 10 U/L — ABNORMAL LOW (ref 15–41)
Albumin: 3.5 g/dL (ref 3.5–5.0)
Alkaline Phosphatase: 76 U/L (ref 38–126)
Anion gap: 11 (ref 5–15)
BUN: 12 mg/dL (ref 8–23)
CO2: 29 mmol/L (ref 22–32)
Calcium: 10 mg/dL (ref 8.9–10.3)
Chloride: 101 mmol/L (ref 98–111)
Creatinine: 0.79 mg/dL (ref 0.44–1.00)
GFR, Estimated: >60 mL/min (ref >60)
Glucose, Bld: 135 mg/dL — ABNORMAL HIGH (ref 70–99)
Potassium: 4 mmol/L (ref 3.5–5.1)
Sodium: 141 mmol/L (ref 135–145)
Total Bilirubin: <0.2 mg/dL — ABNORMAL LOW (ref 0.3–1.2)
Total Protein: 7.2 g/dL (ref 6.5–8.1)

## 2020-10-23 LAB — CBC WITH DIFFERENTIAL (CANCER CENTER ONLY)
Abs Immature Granulocytes: 0.03 10*3/uL (ref 0.00–0.07)
Basophils Absolute: 0.1 10*3/uL (ref 0.0–0.1)
Basophils Relative: 1 %
Eosinophils Absolute: 0.6 10*3/uL — ABNORMAL HIGH (ref 0.0–0.5)
Eosinophils Relative: 8 %
HCT: 34.4 % — ABNORMAL LOW (ref 36.0–46.0)
Hemoglobin: 10.7 g/dL — ABNORMAL LOW (ref 12.0–15.0)
Immature Granulocytes: 0 %
Lymphocytes Relative: 33 %
Lymphs Abs: 2.5 10*3/uL (ref 0.7–4.0)
MCH: 26.4 pg (ref 26.0–34.0)
MCHC: 31.1 g/dL (ref 30.0–36.0)
MCV: 84.9 fL (ref 80.0–100.0)
Monocytes Absolute: 0.7 10*3/uL (ref 0.1–1.0)
Monocytes Relative: 9 %
Neutro Abs: 3.7 10*3/uL (ref 1.7–7.7)
Neutrophils Relative %: 49 %
Platelet Count: 382 10*3/uL (ref 150–400)
RBC: 4.05 MIL/uL (ref 3.87–5.11)
RDW: 13.4 % (ref 11.5–15.5)
WBC Count: 7.5 10*3/uL (ref 4.0–10.5)
nRBC: 0 % (ref 0.0–0.2)

## 2020-10-23 MED ORDER — CYANOCOBALAMIN 1000 MCG/ML IJ SOLN
INTRAMUSCULAR | Status: AC
  Filled 2020-10-23: qty 1

## 2020-10-23 MED ORDER — PROCHLORPERAZINE MALEATE 10 MG PO TABS: 10.0000 mg | ORAL_TABLET | Freq: Four times a day (QID) | ORAL | 0 refills | Status: DC | PRN

## 2020-10-23 MED ORDER — FOLIC ACID 1 MG PO TABS: 1.0000 mg | ORAL_TABLET | Freq: Every day | ORAL | 4 refills | Status: DC

## 2020-10-23 MED ORDER — CYANOCOBALAMIN 1000 MCG/ML IJ SOLN
1000.0000 ug | Freq: Once | INTRAMUSCULAR | Status: AC
  Administered 2020-10-23: 1000 ug via INTRAMUSCULAR

## 2020-10-23 NOTE — Patient Instructions (Signed)

## 2020-10-23 NOTE — Progress Notes (Signed)
START ON PATHWAY REGIMEN - Non-Small Cell Lung     A cycle is every 21 days:     Pembrolizumab      Pemetrexed      Carboplatin   **Always confirm dose/schedule in your pharmacy ordering system**  Patient Characteristics: Stage IV Metastatic, Nonsquamous, Molecular Analysis Completed, Molecular Alteration Present and Targeted Therapy Exhausted OR EGFR Exon 20+ or KRAS G12C+ Present and No Prior Chemo/Immunotherapy OR No Alteration Present, Initial  Chemotherapy/Immunotherapy, PS = 0, 1, No Alteration Present, No Alteration Present, Candidate for Immunotherapy, PD-L1 Expression Positive  ? 50% (TPS) and Immunotherapy Candidate Therapeutic Status: Stage IV Metastatic Histology: Nonsquamous Cell Broad Molecular Profiling Status: Engineer, manufacturing Analysis Results: No Alteration Present ECOG Performance Status: 1 Chemotherapy/Immunotherapy Line of Therapy: Initial Chemotherapy/Immunotherapy EGFR Exons 18-21 Mutation Testing Status: Completed and Negative ALK Fusion/Rearrangement Testing Status: Completed and Negative BRAF V600 Mutation Testing Status: Completed and Negative KRAS G12C Mutation Testing Status: Completed and Negative MET Exon 14 Mutation Testing Status: Completed and Negative RET Fusion/Rearrangement Testing Status: Completed and Negative NTRK Fusion/Rearrangement Testing Status: Completed and Negative ROS1 Fusion/Rearrangement Testing Status: Completed and Negative Immunotherapy Candidate Status: Candidate for Immunotherapy PD-L1 Expression Status: PD-L1 Positive ? 50% (TPS) Intent of Therapy: Non-Curative / Palliative Intent, Discussed with Patient

## 2020-10-23 NOTE — Progress Notes (Signed)
[facility] [phone]   [phone]  OFFICE PROGRESS NOTE  [name], MD [place] [address]  DIAGNOSIS: Stage IV non-small cell lung cancer (T, N0, M1C) adenocarcinoma.  The patient presented with a and solitary brain metastasis.  The patient was diagnosed in February 2022.  Biomarker Findings Microsatellite status - MS-Stable Tumor Mutational Burden - 8 Muts/Mb Genomic Findings For a complete list of the genes assayed, please refer to the Appendix. KRAS G12V KEAP1 S224F TP53 P151T 7 Disease relevant genes with no reportable alterations: ALK, BRAF, EGFR, ERBB2, MET, RET, ROS1  PDL1 Expression: 50%   PRIOR THERAPY: 1) SRS to the metastatic brain lesion on 08/23/2020 under the care of Dr. Tammi Klippel and craniotomy under the care of Dr. Zada Finders scheduled for 08/24/2020. 2) S/p robotic assisted right upper lobectomy with en bloc wedge resection of the right middle lobe and lymph node dissection under the care of Dr. Roxan Hockey on September 24, 2020  CURRENT THERAPY: Systemic chemotherapy with carboplatin for AUC of 5, Alimta 500 Mg/M2 and Keytruda 200 mg IV every 3 weeks.  First dose Nov 07, 2020.   INTERVAL HISTORY: Hannah [name] 70 y.o. female returns to the clinic today for follow-up visit accompanied by her daughter.  The patient is feeling fine today with no concerning complaints.  She recently underwent right upper lobectomy with en bloc resection of the right middle lobe and lymph node dissection under the care of Dr. Roxan Hockey on September 24, 2020.  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  C dissected lymph nodes were negative for malignancy.  The patient is recovering well from her surgery and she is here today for evaluation and discussion of her treatment options.  She is feeling fine today with no concerning  complaints except for fatigue.  She has no chest pain, shortness of breath, cough or hemoptysis.  She denied having any fever or chills.  She has no nausea, vomiting, diarrhea or constipation.  She has no headache or visual changes.  MEDICAL HISTORY: Past Medical History:  Diagnosis Date  . Allergy   . Anemia   . Anxiety   . Arthritis   . Carpal tunnel syndrome   . Constipation    senna C stool softeners help   . Depression   . Diverticulosis   . Dyslipidemia   . External hemorrhoids   . GERD (gastroesophageal reflux disease)   . Heart murmur    mild-moderate AR  . Hiatal hernia   . Hyperlipidemia    on meds   . Hypertension   . Internal hemorrhoids   . Osteoarthritis   . Pre-diabetes   . PVD (peripheral vascular disease) (HCC)    moderate carotid disease  . RBBB   . Smoker   . Vocal cord polyps     ALLERGIES:  is allergic to amlodipine, chantix [varenicline tartrate], clarithromycin, lisinopril, simvastatin, wellbutrin [bupropion hcl], lipitor [atorvastatin], and sertraline.  MEDICATIONS:  Current Outpatient Medications  Medication Sig Dispense Refill  . acetaminophen (TYLENOL) 500 MG tablet Take 2 tablets (1,000 mg total) by mouth every 6 (six) hours. 30 tablet 0  . Ascorbic Acid (VITAMIN C) 1000 MG tablet Take 1,000 mg by mouth daily.    Marland Kitchen aspirin 81 MG tablet Take 1 tablet (81 mg total) by mouth daily. Restart on 08/31/20 30 tablet   . carboxymethylcellulose (  REFRESH PLUS) 0.5 % SOLN Place 1-2 drops into both eyes daily.    . clonazePAM (KLONOPIN) 0.5 MG tablet Take 1 tablet (0.5 mg total) by mouth 2 (two) times daily as needed for anxiety. 60 tablet 1  . hydrochlorothiazide (HYDRODIURIL) 12.5 MG tablet Take 1 tablet (12.5 mg total) by mouth daily. 90 tablet 1  . loratadine (CLARITIN) 10 MG tablet Take 10 mg by mouth daily as needed for allergies.    . mirtazapine (REMERON) 30 MG tablet Take 1 tablet (30 mg total) by mouth at bedtime. 30 tablet 5  .  neomycin-bacitracin-polymyxin (NEOSPORIN) ointment Apply 1 application topically as needed for wound care.    Marland Kitchen omeprazole (PRILOSEC) 40 MG capsule Take 1 capsule (40 mg total) by mouth 2 (two) times daily before a meal. 180 capsule 1  . polyethylene glycol (MIRALAX / GLYCOLAX) 17 g packet Take 17 g by mouth 2 (two) times daily. 72 each 0  . pravastatin (PRAVACHOL) 40 MG tablet Take 1 tablet (40 mg total) by mouth every evening. 90 tablet 1  . Probiotic Product (PROBIOTIC DAILY PO) Take 1 capsule by mouth daily.    Marland Kitchen senna-docusate (SENOKOT-S) 8.6-50 MG tablet Take 1 tablet by mouth 2 (two) times daily. (Patient taking differently: Take 1 tablet by mouth daily as needed for mild constipation.)    . simethicone (MYLICON) 80 MG chewable tablet Chew 1 tablet (80 mg total) by mouth 4 (four) times daily as needed for flatulence (Bloating). 30 tablet 0  . Wheat Dextrin (BENEFIBER) POWD Take 1 Scoop by mouth 2 (two) times daily as needed (constipation).    . traMADol (ULTRAM) 50 MG tablet Take 1-2 tablets (50-100 mg total) by mouth every 6 (six) hours as needed (mild pain). (Patient not taking: No sig reported) 30 tablet 0   No current facility-administered medications for this visit.    SURGICAL HISTORY:  Past Surgical History:  Procedure Laterality Date  . ABDOMINAL HYSTERECTOMY  1994  . APPLICATION OF CRANIAL NAVIGATION N/A 08/24/2020   Procedure: APPLICATION OF CRANIAL NAVIGATION;  Surgeon: Judith Part, MD;  Location: [facility];  Service: Neurosurgery;  Laterality: N/A;  . COLONOSCOPY    . COLONOSCOPY W/ POLYPECTOMY  2009  . CRANIOTOMY Left 08/24/2020   Procedure: Left Craniotomy for Tumor Resection with Brainlab;  Surgeon: Judith Part, MD;  Location: [facility];  Service: Neurosurgery;  Laterality: Left;  . HEMORRHOID SURGERY    . INTERCOSTAL NERVE BLOCK Right 09/24/2020   Procedure: INTERCOSTAL NERVE BLOCK;  Surgeon: Melrose Nakayama, MD;  Location: [facility];  Service: Thoracic;   Laterality: Right;  . JOINT REPLACEMENT    . LYMPH NODE DISSECTION Right 09/24/2020   Procedure: LYMPH NODE DISSECTION;  Surgeon: Melrose Nakayama, MD;  Location: [facility];  Service: Thoracic;  Laterality: Right;  . POLYPECTOMY    . right total knee arthroplasty     Dr. Emeterio Reeve 06-04-18  . SPINE SURGERY  08/16/2009  . TOTAL KNEE ARTHROPLASTY Left 02/13/2014   Procedure: TOTAL KNEE ARTHROPLASTY;  Surgeon: Alta Corning, MD;  Location: [facility];  Service: Orthopedics;  Laterality: Left;  . TOTAL KNEE ARTHROPLASTY Right 06/04/2018   Procedure: RIGHT TOTAL KNEE ARTHROPLASTY;  Surgeon: Dorna Leitz, MD;  Location: [facility];  Service: Orthopedics;  Laterality: Right;  Adductor Block  . TUBAL LIGATION      REVIEW OF SYSTEMS:  Constitutional: positive for fatigue Eyes: negative Ears, nose, mouth, throat, and face: negative Respiratory: negative Cardiovascular: negative Gastrointestinal: negative Genitourinary:negative  Integument/breast: negative Hematologic/lymphatic: negative Musculoskeletal:negative Neurological: negative Behavioral/Psych: positive for anxiety Endocrine: negative Allergic/Immunologic: negative   PHYSICAL EXAMINATION: General appearance: alert, cooperative, fatigued and no distress Head: Normocephalic, without obvious abnormality, atraumatic Neck: no adenopathy, no JVD, supple, symmetrical, trachea midline and thyroid not enlarged, symmetric, no tenderness/mass/nodules Lymph nodes: Cervical, supraclavicular, and axillary nodes normal. Resp: clear to auscultation bilaterally Back: symmetric, no curvature. ROM normal. No CVA tenderness. Cardio: regular rate and rhythm, S1, S2 normal, no murmur, click, rub or gallop GI: soft, non-tender; bowel sounds normal; no masses,  no organomegaly Extremities: extremities normal, atraumatic, no cyanosis or edema Neurologic: Alert and oriented X 3, normal strength and tone. Normal symmetric reflexes. Normal coordination and gait  ECOG  PERFORMANCE STATUS: 1 - Symptomatic but completely ambulatory  Blood pressure 136/77, pulse 87, temperature 98.1 F (36.7 C), temperature source Tympanic, resp. rate 18, height 5' (1.524 m), weight 110 lb 11.2 oz (50.2 kg), SpO2 100 %.  LABORATORY DATA: Lab Results  Component Value Date   WBC 7.5 10/23/2020   HGB 10.7 (L) 10/23/2020   HCT 34.4 (L) 10/23/2020   MCV 84.9 10/23/2020   PLT 382 10/23/2020      Chemistry      Component Value Date/Time   NA 139 09/26/2020 0052   K 3.9 09/26/2020 0052   CL 105 09/26/2020 0052   CO2 30 09/26/2020 0052   BUN 17 09/26/2020 0052   CREATININE 0.69 09/26/2020 0052   CREATININE 0.92 08/07/2020 0856   CREATININE 0.79 03/14/2020 1016      Component Value Date/Time   CALCIUM 9.0 09/26/2020 0052   ALKPHOS 35 (L) 09/26/2020 0052   AST 21 09/26/2020 0052   AST 17 08/07/2020 0856   ALT 13 09/26/2020 0052   ALT 11 08/07/2020 0856   BILITOT 0.6 09/26/2020 0052   BILITOT 0.3 08/07/2020 0856       RADIOGRAPHIC STUDIES: DG Chest 1 View  Result Date: 09/25/2020 CLINICAL DATA:  Chest tube.  Status post lung surgery.  Sore chest. EXAM: CHEST  1 VIEW COMPARISON:  09/24/2020. FINDINGS: Postsurgical changes right chest. Interim removal of endotracheal tube. Right chest tube in stable position. Prominent right upper pneumothorax noted on today's exam. No evidence of tension. Atelectatic changes right upper lung. No pleural effusion. Heart size normal. Degenerative change thoracic spine. No acute bony abnormality identified. Right chest wall subcutaneous emphysema noted. IMPRESSION: Postsurgical changes right chest. Interim removal of endotracheal tube. Right chest tube in stable position. Prominent right upper pneumothorax noted on today's exam. No evidence of tension. Critical Value/emergent results were called by telephone at the time of interpretation on 09/25/2020 at 6:11 am to nurse Lavella Lemons , who verbally acknowledged these results. Electronically Signed    By: Marcello Moores  Register   On: 09/25/2020 06:15   DG Chest 2 View  Result Date: 10/09/2020 CLINICAL DATA:  Lung cancer, status post right upper lobectomy EXAM: CHEST - 2 VIEW COMPARISON:  09/28/2020 FINDINGS: Previous right upper lobectomy with volume loss in the right hemithorax and elevation of the right hemidiaphragm. Resolved right apical pneumothorax. No large effusion. Stable aeration without focal pneumonia or collapse. Normal heart size and vascularity. Trachea midline. Degenerative changes of the spine. Lumbar fusion hardware partially imaged. IMPRESSION: Resolved right apical pneumothorax. Postoperative findings as above. No acute finding by plain radiography. Electronically Signed   By: Jerilynn Mages.  Shick M.D.   On: 10/09/2020 10:04   DG Chest 2 View  Result Date: 09/28/2020 CLINICAL DATA:  Status post right upper  lobectomy with small pneumothorax EXAM: CHEST - 2 VIEW COMPARISON:  September 27, 2020 FINDINGS: Persistent small right apical and apicolateral pneumothorax that tension component. Postoperative change on the right with suspected loculated fluid right upper lung region posteriorly. No edema or airspace opacity. Heart size and pulmonary vascularity are normal. No adenopathy. IMPRESSION: Persistent small right-sided pneumothorax that tension component. Loculated fluid posterior right upper hemithorax. No edema or consolidation. Stable cardiac silhouette. Electronically Signed   By: Lowella Grip III M.D.   On: 09/28/2020 08:02   DG Chest 2 View  Result Date: 09/24/2020 CLINICAL DATA:  Lung cancer EXAM: CHEST - 2 VIEW COMPARISON:  None. FINDINGS: The lungs are symmetrically well expanded. 4.5 x 5.2 cm mass is seen within the right upper lung zone peripherally, better seen on CT examination of 08/02/2020. The lungs are otherwise clear. No pneumothorax or pleural effusion. Cardiac size within normal limits. Pulmonary vascularity is normal. No acute bone abnormality. No lytic lesions identified within  the thorax. IMPRESSION: 5.2 cm right upper lobe pulmonary mass, better seen on prior CT examination. No radiographic evidence of acute cardiopulmonary disease. Electronically Signed   By: Fidela Salisbury MD   On: 09/24/2020 06:31   DG Chest 1V REPEAT Same Day  Result Date: 09/27/2020 CLINICAL DATA:  Status post chest tube removal. EXAM: CHEST - 1 VIEW SAME DAY COMPARISON:  Same day. FINDINGS: Right-sided chest tube has been removed. Grossly stable small right apical pneumothorax. Left lung is clear. Bony thorax is unremarkable. Subcutaneous emphysema is seen over right lateral chest wall. IMPRESSION: Grossly stable small right apical pneumothorax status post right-sided chest tube removal. Electronically Signed   By: Marijo Conception M.D.   On: 09/27/2020 15:49   DG Chest Port 1 View  Result Date: 09/27/2020 CLINICAL DATA:  Status post lobectomy.  Chest tube. EXAM: PORTABLE CHEST 1 VIEW COMPARISON:  Chest x-ray 09/26/2020, 09/25/2020. FINDINGS: Right chest tube noted in stable position. Tiny right apical pneumothorax noted on today's exam. Pneumothorax is much smaller than noted on prior study of 09/25/2020. Postsurgical changes right upper lung with stable right apical atelectasis/postsurgical change. No prominent pleural effusion. Heart size stable. Stable right chest wall subcutaneous emphysema. IMPRESSION: 1. Right chest tube in stable position. Tiny right apical pneumothorax noted on today's exam. Pneumothorax is much smaller than noted on prior study 09/25/2020. 2. Postsurgical changes right upper lung with stable right apical atelectasis/postsurgical change. These results will be called to the ordering clinician or representative by the Radiologist Assistant, and communication documented in the PACS or [employer]. Electronically Signed   By: Marcello Moores  Register   On: 09/27/2020 06:56   DG Chest Port 1 View  Result Date: 09/26/2020 CLINICAL DATA:  Chest tube.  Sore chest. EXAM: PORTABLE CHEST 1  VIEW COMPARISON:  09/25/2020. FINDINGS: Right chest tube noted in stable position. Previously identified right pneumothorax has resolved. Postsurgical changes right lung. Persistent right apical atelectasis and or loculated right pleural effusion. Stable cardiomegaly. Stable right chest wall subcutaneous emphysema. IMPRESSION: 1. Right chest tube in stable position. Interim resolution of right pneumothorax. 2. Postsurgical changes right lung. Persistent right apical atelectasis and or loculated apical pleural effusion. Electronically Signed   By: Marcello Moores  Register   On: 09/26/2020 06:46   DG Chest Port 1 View  Result Date: 09/24/2020 CLINICAL DATA:  Rule out retained foreign body, intraop. EXAM: PORTABLE CHEST 1 VIEW COMPARISON:  09/24/2020. FINDINGS: No unexpected metallic radiopaque foreign body. Endotracheal tube in place with selective ventilation on  the left, terminating in the left mainstem bronchus. Right chest tube terminates in the medial right lung apex. Right upper lobectomy. Small amount of pleural air at the base of the right hemithorax. Subcutaneous air along the right chest wall. Streaky atelectasis in the left lung. IMPRESSION: 1. No metallic radiopaque foreign body. 2. Right upper lobectomy. Trace right pneumothorax with right chest tube in place. 3. Streaky atelectasis in the left lung. Electronically Signed   By: Lorin Picket M.D.   On: 09/24/2020 11:12    ASSESSMENT AND PLAN: This is a very pleasant 70 years old African-American female diagnosed with a stage IV (T3, N0, M1b) non-small cell lung cancer, adenocarcinoma presented with right upper lobe lung mass with solitary brain metastasis diagnosed in February 2022 status post SRS to the brain lesion followed by craniotomy and resection. The patient also has a solitary lung mass. S/p robotic assisted right upper lobectomy with en bloc wedge resection of the right middle lobe and lymph node dissection under the care of Dr. Roxan Hockey  on September 24, 2020. The patient is recovering well from her recent surgery. I had a lengthy discussion with the patient and her daughter about her current disease stage, prognosis and treatment options. Explained to the patient and her daughter that she has stage IV lung cancer and she definitely would benefit from adjuvant systemic chemotherapy plus immunotherapy after her surgical resection for the lung and brain lesion. I recommended for the patient systemic chemotherapy with carboplatin for AUC of 5, Alimta 500 Mg/M2 and Keytruda 200 mg IV every 3 weeks for 4-6 cycles. I discussed with the patient and her daughter the adverse effect of this treatment including but not limited to alopecia, myelosuppression, nausea and vomiting, peripheral neuropathy, liver or renal dysfunction as well as immunotherapy adverse effects. She is expected to start the first cycle of this treatment in around 2 weeks. The patient will have a chemotherapy education class before the first dose of her treatment. She will receive vitamin B12 injection today. I will call her pharmacy with prescription for Compazine 10 mg p.o. every 6 hours as needed for nausea in addition to folic acid 1 mg p.o. daily. The patient will come back for follow-up visit with the start of the first cycle of her treatment. She was advised to call immediately if she has any concerning symptoms in the interval. The patient voices understanding of current disease status and treatment options and is in agreement with the current care plan.  All questions were answered. The patient knows to call the clinic with any problems, questions or concerns. We can certainly see the patient much sooner if necessary.  The total time spent in the appointment was 40 minutes.  Disclaimer: This note was dictated with voice recognition software. Similar sounding words can inadvertently be transcribed and may not be corrected upon review.

## 2020-10-25 ENCOUNTER — Ambulatory Visit: Payer: Medicare HMO | Admitting: Podiatry

## 2020-10-25 ENCOUNTER — Telehealth: Payer: Self-pay | Admitting: *Deleted

## 2020-10-25 ENCOUNTER — Other Ambulatory Visit: Payer: Self-pay

## 2020-10-25 ENCOUNTER — Telehealth: Payer: Self-pay | Admitting: Internal Medicine

## 2020-10-25 ENCOUNTER — Encounter: Payer: Self-pay | Admitting: Podiatry

## 2020-10-25 DIAGNOSIS — B353 Tinea pedis: Secondary | ICD-10-CM

## 2020-10-25 DIAGNOSIS — M2012 Hallux valgus (acquired), left foot: Secondary | ICD-10-CM

## 2020-10-25 DIAGNOSIS — M2011 Hallux valgus (acquired), right foot: Secondary | ICD-10-CM | POA: Diagnosis not present

## 2020-10-25 DIAGNOSIS — M6740 Ganglion, unspecified site: Secondary | ICD-10-CM | POA: Diagnosis not present

## 2020-10-25 MED ORDER — KETOCONAZOLE 2 % EX CREA: 1.0000 "application " | TOPICAL_CREAM | Freq: Two times a day (BID) | CUTANEOUS | 2 refills | Status: DC

## 2020-10-25 NOTE — Telephone Encounter (Signed)
Scheduled per los. Called and spoke with patients daughter, [name]. Confirmed all appts

## 2020-10-25 NOTE — Telephone Encounter (Signed)
I received a message from scheduling that Ms. [place] daughter would like to speak to someone to help with a few questions. I called and clarified questions.

## 2020-10-27 NOTE — Progress Notes (Signed)
Subjective:  Patient ID: Hannah [name], female    DOB: 1951-02-18,  MRN: [id number] HPI Chief Complaint  Patient presents with  . Skin Problem    Plantar 1st toe/MPJ right - "rash" x 1-2 weeks, pulled skin off and bled some, also "blister" dorsal 4th toe at base of nail right  . New Patient (Initial Visit)    70 y.o. female presents with the above complaint.   ROS: Denies fever chills nausea vomiting muscle aches pains calf pain back pain chest pain shortness of breath.  Past Medical History:  Diagnosis Date  . Allergy   . Anemia   . Anxiety   . Arthritis   . Carpal tunnel syndrome   . Constipation    senna C stool softeners help   . Depression   . Diverticulosis   . Dyslipidemia   . External hemorrhoids   . GERD (gastroesophageal reflux disease)   . Heart murmur    mild-moderate AR  . Hiatal hernia   . Hyperlipidemia    on meds   . Hypertension   . Internal hemorrhoids   . Osteoarthritis   . Pre-diabetes   . PVD (peripheral vascular disease) (HCC)    moderate carotid disease  . RBBB   . Smoker   . Vocal cord polyps    Past Surgical History:  Procedure Laterality Date  . ABDOMINAL HYSTERECTOMY  1994  . APPLICATION OF CRANIAL NAVIGATION N/A 08/24/2020   Procedure: APPLICATION OF CRANIAL NAVIGATION;  Surgeon: Judith Part, MD;  Location: [facility];  Service: Neurosurgery;  Laterality: N/A;  . COLONOSCOPY    . COLONOSCOPY W/ POLYPECTOMY  2009  . CRANIOTOMY Left 08/24/2020   Procedure: Left Craniotomy for Tumor Resection with Brainlab;  Surgeon: Judith Part, MD;  Location: [facility];  Service: Neurosurgery;  Laterality: Left;  . HEMORRHOID SURGERY    . INTERCOSTAL NERVE BLOCK Right 09/24/2020   Procedure: INTERCOSTAL NERVE BLOCK;  Surgeon: Melrose Nakayama, MD;  Location: [facility];  Service: Thoracic;  Laterality: Right;  . JOINT REPLACEMENT    . LYMPH NODE DISSECTION Right 09/24/2020   Procedure: LYMPH NODE DISSECTION;  Surgeon: Melrose Nakayama, MD;   Location: [facility];  Service: Thoracic;  Laterality: Right;  . POLYPECTOMY    . right total knee arthroplasty     Dr. Emeterio Reeve 06-04-18  . SPINE SURGERY  08/16/2009  . TOTAL KNEE ARTHROPLASTY Left 02/13/2014   Procedure: TOTAL KNEE ARTHROPLASTY;  Surgeon: Alta Corning, MD;  Location: [facility];  Service: Orthopedics;  Laterality: Left;  . TOTAL KNEE ARTHROPLASTY Right 06/04/2018   Procedure: RIGHT TOTAL KNEE ARTHROPLASTY;  Surgeon: Dorna Leitz, MD;  Location: [facility];  Service: Orthopedics;  Laterality: Right;  Adductor Block  . TUBAL LIGATION      Current Outpatient Medications:  .  ketoconazole (NIZORAL) 2 % cream, Apply 1 application topically 2 (two) times daily., Disp: 15 g, Rfl: 2 .  acetaminophen (TYLENOL) 500 MG tablet, Take 2 tablets (1,000 mg total) by mouth every 6 (six) hours., Disp: 30 tablet, Rfl: 0 .  Ascorbic Acid (VITAMIN C) 1000 MG tablet, Take 1,000 mg by mouth daily., Disp: , Rfl:  .  aspirin 81 MG tablet, Take 1 tablet (81 mg total) by mouth daily. Restart on 08/31/20, Disp: 30 tablet, Rfl:  .  carboxymethylcellulose (REFRESH PLUS) 0.5 % SOLN, Place 1-2 drops into both eyes daily., Disp: , Rfl:  .  clonazePAM (KLONOPIN) 0.5 MG tablet, Take 1 tablet (0.5 mg total)  by mouth 2 (two) times daily as needed for anxiety., Disp: 60 tablet, Rfl: 1 .  folic acid (FOLVITE) 1 MG tablet, Take 1 tablet (1 mg total) by mouth daily., Disp: 30 tablet, Rfl: 4 .  hydrochlorothiazide (HYDRODIURIL) 12.5 MG tablet, Take 1 tablet (12.5 mg total) by mouth daily., Disp: 90 tablet, Rfl: 1 .  loratadine (CLARITIN) 10 MG tablet, Take 10 mg by mouth daily as needed for allergies., Disp: , Rfl:  .  magnesium oxide 400 (240 Mg) MG TABS, , Disp: , Rfl:  .  mirtazapine (REMERON) 30 MG tablet, Take 1 tablet (30 mg total) by mouth at bedtime., Disp: 30 tablet, Rfl: 5 .  neomycin-bacitracin-polymyxin (NEOSPORIN) ointment, Apply 1 application topically as needed for wound care., Disp: , Rfl:  .  omeprazole (PRILOSEC) 40  MG capsule, Take 1 capsule (40 mg total) by mouth 2 (two) times daily before a meal., Disp: 180 capsule, Rfl: 1 .  polyethylene glycol (MIRALAX / GLYCOLAX) 17 g packet, Take 17 g by mouth 2 (two) times daily., Disp: 72 each, Rfl: 0 .  pravastatin (PRAVACHOL) 40 MG tablet, Take 1 tablet (40 mg total) by mouth every evening., Disp: 90 tablet, Rfl: 1 .  Probiotic Product (PROBIOTIC DAILY PO), Take 1 capsule by mouth daily., Disp: , Rfl:  .  prochlorperazine (COMPAZINE) 10 MG tablet, Take 1 tablet (10 mg total) by mouth every 6 (six) hours as needed for nausea or vomiting., Disp: 30 tablet, Rfl: 0 .  senna-docusate (SENOKOT-S) 8.6-50 MG tablet, Take 1 tablet by mouth 2 (two) times daily. (Patient taking differently: Take 1 tablet by mouth daily as needed for mild constipation.), Disp: , Rfl:  .  simethicone (MYLICON) 80 MG chewable tablet, Chew 1 tablet (80 mg total) by mouth 4 (four) times daily as needed for flatulence (Bloating)., Disp: 30 tablet, Rfl: 0 .  traMADol (ULTRAM) 50 MG tablet, Take 1-2 tablets (50-100 mg total) by mouth every 6 (six) hours as needed (mild pain). (Patient not taking: No sig reported), Disp: 30 tablet, Rfl: 0 .  Wheat Dextrin (BENEFIBER) POWD, Take 1 Scoop by mouth 2 (two) times daily as needed (constipation)., Disp: , Rfl:   Allergies  Allergen Reactions  . Amlodipine Swelling and Rash    Rash, swelling  . Chantix [Varenicline Tartrate] Shortness Of Breath, Swelling and Other (See Comments)    Tongue swell,sob  . Clarithromycin Rash  . Lisinopril Hives  . Simvastatin Hives  . Wellbutrin [Bupropion Hcl] Hives  . Lipitor [Atorvastatin]     Dizziness per patient  . Sertraline     Makes her feel like she is going to kill someone   Review of Systems Objective:  There were no vitals filed for this visit.  General: Well developed, nourished, in no acute distress, alert and oriented x3   Dermatological: Skin is warm, dry and supple bilateral. Nails x 10 are well  maintained; remaining integument appears unremarkable at this time. There are no open sores, no preulcerative lesions, no rash or signs of infection present.  Vesicular eruption of tenia pedis plantar aspect of the first metatarsophalangeal joint of the right foot.  She also has a mucoid cyst lesser digit DIPJ into the eponychium.  No signs of bacterial infection no open lesions or wounds are noted.  Vascular: Dorsalis Pedis artery and Posterior Tibial artery pedal pulses are 2/4 bilateral with immedate capillary fill time. Pedal hair growth present. No varicosities and no lower extremity edema present bilateral.   Neruologic: Grossly  intact via light touch bilateral. Vibratory intact via tuning fork bilateral. Protective threshold with Semmes Wienstein monofilament intact to all pedal sites bilateral. Patellar and Achilles deep tendon reflexes 2+ bilateral. No Babinski or clonus noted bilateral.   Musculoskeletal: No gross boney pedal deformities bilateral. No pain, crepitus, or limitation noted with foot and ankle range of motion bilateral. Muscular strength 5/5 in all groups tested bilateral.  Hallux abductovalgus deformity bilateral no crepitation slight limitation range of motion  Gait: Unassisted, Nonantalgic.    Radiographs:  None taken  Assessment & Plan:   Assessment: Tinea pedis right first metatarsophalangeal joint plantarly.  Mucoid cyst lesser digit right foot  Hallux abductovalgus bilateral  Plan: Started her on ketoconazole I will follow-up with her in 1 month to 6 weeks if necessary.     Briget Shaheed T. [place], [place]

## 2020-10-29 ENCOUNTER — Encounter: Payer: Self-pay | Admitting: Internal Medicine

## 2020-10-29 NOTE — Progress Notes (Signed)
  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  ExacTrac registration was performed for each couch angle.  The 100% isodose line was prescribed.  6 MV X-rays were delivered in the flattening filter free beam mode.beam mode.  Narrative: The patient tolerated radiation treatment relatively well.     Plan: The patient has completed radiation treatment. She'll undergo resection of the brain metastasis.  The patient will return to radiation oncology clinic for routine followup in one month. I advised her to call or return sooner if she has any questions or concerns related to her recovery or treatment. ________________________________  Sheral Apley. Tammi Klippel, M.D.

## 2020-10-30 ENCOUNTER — Telehealth: Payer: Medicare HMO

## 2020-10-30 ENCOUNTER — Ambulatory Visit (INDEPENDENT_AMBULATORY_CARE_PROVIDER_SITE_OTHER): Payer: Medicare HMO | Admitting: *Deleted

## 2020-10-30 ENCOUNTER — Encounter: Payer: Self-pay | Admitting: Internal Medicine

## 2020-10-30 DIAGNOSIS — C3411 Malignant neoplasm of upper lobe, right bronchus or lung: Secondary | ICD-10-CM | POA: Diagnosis not present

## 2020-10-30 DIAGNOSIS — I10 Essential (primary) hypertension: Secondary | ICD-10-CM | POA: Diagnosis not present

## 2020-10-30 NOTE — Patient Instructions (Signed)
Visit Information  Hannah [name], it was nice talking with you and [name] today.   Please read over the attached information; I am glad that your foot blisters are healing and that you have a scheduled appointment with Dr. Casimiro Needle- great job staying on top of your health needs!    I look forward to talking to you again for an update on Thursday December 27, 2020 at 2:00 pm- please be listening out for my call that day.  I will call as close to 2:00 pm as possible; I look forward to hearing about your progress.   Please don't hesitate to contact me if I can be of assistance to you before our next scheduled appointment.   [name], RN, BSN, [facility] RN Care Coordination- [place] [phone]: direct office [phone]: mobile    PATIENT GOALS: Goals Addressed            This Visit's Progress   . Keep Pain and emotions Under Control-Cancer   On track    Timeframe:  Long-Range Goal Priority:  Medium Start Date:           10/10/20                  Expected End Date:      01/10/21                 Follow Up Date 12/27/20   . Great job managing your medications at home; continue to call for prescription refill 3 days before needed . I am glad that your personal pain management plan has been working- if you have changes in your pain levels, please call your care providers . Do something like read, listen to music or watch television to keep mind off pain and emotions . Plan exercise or activity when pain is best controlled: great job being active and getting out when you can each week . Prioritize tasks for the day . Track times pain and emotional distress is worst and when it is best . Track what makes the pain and emotional distress worse and what makes it better . Work slower and less intense when having pain- keep taking your time and be conservative on the days that you aren't feeling 100%  . I am very glad that Hannah [name]'s pain is under control: please keep  Dr. Quay Burow and the oncology team updated on her pain levels, especially if there are changes  . Glad to hear that Hannah [name]'s foot blisters were evaluated by the podiatrist and are better! . Keep completing your journal about your emotions/ rest/ feelings so you can share this with Dr. Casimiro Needle during your upcoming scheduled appointment  Why is this important?    Day-to-day life can be hard when you have pain.   Even a small change in emotion or a physical problem can make pain better or worse.   Coping with pain depends on how the mind and body react to pain.   Pain medicine is just one piece of the treatment puzzle.   There are many tools to help manage pain. A combination of them can be used to best meet your needs.         . Track and Manage My Blood Pressure-Hypertension   On track    Timeframe:  Long-Range Goal Priority:  Medium Start Date:       10/10/20  Expected End Date:   04/12/21                    Follow Up Date 12/27/20   . Great job monitoring your blood pressure regularly- keep up the great work: glad to hear that recent blood pressures at home have been stable! . Continue checking blood pressure at least 3 times per week . Write blood pressure results in a log or diary- we will review your blood pressures during our follow up calls . Continue taking your medications as prescribed . Continue following a low salt heart healthy diet- I am glad you have seen an improvement in your appetite and have gained more weight . As you continue recuperating from your recent surgeries, try to gradually increase your activity; pace activity with periods of rest and don't over-do activity . Contact your care providers/ doctors if you have changes or concerns about your blood pressure readings at home   Why is this important?    You won't feel high blood pressure, but it can still hurt your blood vessels.   High blood pressure can cause heart or kidney problems. It  can also cause a stroke.   Making lifestyle changes like losing a little weight or eating less salt will help.   Checking your blood pressure at home and at different times of the day can help to control blood pressure.   If the doctor prescribes medicine remember to take it the way the doctor ordered.   Call the office if you cannot afford the medicine or if there are questions about it.           Plan:  Patient verbalizes understanding of instructions provided today and agrees to view in [place].   Telephone follow up appointment with care management team member scheduled for: Thursday December 27, 2020 at 2:00 pm  The patient has been provided with contact information for the care management team and has been advised to call with any health related questions or concerns.   [name], RN, BSN, [facility] RN Care Coordination- [place] [phone]: direct office [phone]: mobile

## 2020-10-30 NOTE — Chronic Care Management (AMB) (Signed)
Chronic Care Management   CCM RN Visit Note  10/30/2020 Name: Hannah [name] MRN: [id number] DOB: [birth year]  Subjective: Hannah [name] is a 70 y.o. year old female who is a primary care patient of Burns, Claudina Lick, MD. The care management team was consulted for assistance with disease management and care coordination needs.    Engaged with patient and daughter/ caregiver by telephone for follow up visit in response to provider referral for case management and/or care coordination services.   Consent to Services:  The patient was given information about Chronic Care Management services, agreed to services, and gave verbal consent prior to initiation of services.  Please see initial visit note for detailed documentation.  Patient agreed to services and verbal consent obtained.   Assessment: Review of patient past medical history, allergies, medications, health status, including review of consultants reports, laboratory and other test data, was performed as part of comprehensive evaluation and provision of chronic care management services.   CCM Care Plan  Allergies  Allergen Reactions  . Amlodipine Swelling and Rash    Rash, swelling  . Chantix [Varenicline Tartrate] Shortness Of Breath, Swelling and Other (See Comments)    Tongue swell,sob  . Clarithromycin Rash  . Lisinopril Hives  . Simvastatin Hives  . Wellbutrin [Bupropion Hcl] Hives  . Lipitor [Atorvastatin]     Dizziness per patient  . Sertraline     Makes her feel like she is going to kill someone    Outpatient Encounter Medications as of 10/30/2020  Medication Sig Note  . acetaminophen (TYLENOL) 500 MG tablet Take 2 tablets (1,000 mg total) by mouth every 6 (six) hours.   . Ascorbic Acid (VITAMIN C) 1000 MG tablet Take 1,000 mg by mouth daily.   Marland Kitchen aspirin 81 MG tablet Take 1 tablet (81 mg total) by mouth daily. Restart on 08/31/20   . carboxymethylcellulose (REFRESH PLUS) 0.5 % SOLN Place 1-2 drops into both eyes daily.    . clonazePAM (KLONOPIN) 0.5 MG tablet Take 1 tablet (0.5 mg total) by mouth 2 (two) times daily as needed for anxiety.   . folic acid (FOLVITE) 1 MG tablet Take 1 tablet (1 mg total) by mouth daily.   . hydrochlorothiazide (HYDRODIURIL) 12.5 MG tablet Take 1 tablet (12.5 mg total) by mouth daily.   Marland Kitchen ketoconazole (NIZORAL) 2 % cream Apply 1 application topically 2 (two) times daily.   Marland Kitchen loratadine (CLARITIN) 10 MG tablet Take 10 mg by mouth daily as needed for allergies.   . mirtazapine (REMERON) 30 MG tablet Take 1 tablet (30 mg total) by mouth at bedtime.   Marland Kitchen omeprazole (PRILOSEC) 40 MG capsule Take 1 capsule (40 mg total) by mouth 2 (two) times daily before a meal.   . polyethylene glycol (MIRALAX / GLYCOLAX) 17 g packet Take 17 g by mouth 2 (two) times daily.   . pravastatin (PRAVACHOL) 40 MG tablet Take 1 tablet (40 mg total) by mouth every evening.   . Probiotic Product (PROBIOTIC DAILY PO) Take 1 capsule by mouth daily.   . prochlorperazine (COMPAZINE) 10 MG tablet Take 1 tablet (10 mg total) by mouth every 6 (six) hours as needed for nausea or vomiting. 10/30/2020: 10/30/20: reports has not needed recently  . senna-docusate (SENOKOT-S) 8.6-50 MG tablet Take 1 tablet by mouth 2 (two) times daily. (Patient taking differently: Take 1 tablet by mouth daily as needed for mild constipation.)   . simethicone (MYLICON) 80 MG chewable tablet Chew 1 tablet (80 mg total)  by mouth 4 (four) times daily as needed for flatulence (Bloating).   . Wheat Dextrin (BENEFIBER) POWD Take 1 Scoop by mouth 2 (two) times daily as needed (constipation). 10/30/2020: 10/30/20: patient reports has not needed recently   . magnesium oxide 400 (240 Mg) MG TABS  (Patient not taking: Reported on 10/30/2020) 10/30/2020: 10/30/20: Patient reports this was not prescribed for her at time of 10/25/20 podiatry visit: patient is not taking  . neomycin-bacitracin-polymyxin (NEOSPORIN) ointment Apply 1 application topically as needed for wound  care. (Patient not taking: Reported on 10/30/2020)   . traMADol (ULTRAM) 50 MG tablet Take 1-2 tablets (50-100 mg total) by mouth every 6 (six) hours as needed (mild pain). (Patient not taking: No sig reported)    No facility-administered encounter medications on file as of 10/30/2020.    Patient Active Problem List   Diagnosis Date Noted  . Encounter for antineoplastic chemotherapy 10/23/2020  . Encounter for antineoplastic immunotherapy 10/23/2020  . Anemia 10/19/2020  . Sleep difficulties 10/19/2020  . S/P robot-assisted surgical procedure 09/24/2020  . Aortic atherosclerosis ([place]) 09/12/2020  . Lung mass 09/04/2020  . Status post craniotomy 08/24/2020  . Brain tumor ([place]) 08/24/2020  . Hematochezia 08/19/2020  . Acute blood loss anemia   . Rectal bleeding 08/17/2020  . Primary cancer of right upper lobe of lung ([place]) 08/07/2020  . Brain metastasis ([place]) 08/06/2020  . Memory changes 07/24/2020  . Non-recurrent unilateral inguinal hernia without obstruction or gangrene 07/24/2020  . Pain of right clavicle 09/12/2019  . Nonspecific abnormal electrocardiogram (ECG) (EKG) 10/20/2018  . Educated about COVID-19 virus infection 10/20/2018  . Weight loss 07/17/2018  . Depression 07/17/2018  . Primary osteoarthritis of right knee 06/04/2018  . Bilateral carotid artery stenosis 09/24/2017  . Aortic valve sclerosis 09/24/2017  . Vocal cord polyps 09/07/2017  . Dysphagia 09/07/2017  . Diabetes mellitus without complication ([place]) 18/29/9371  . GERD (gastroesophageal reflux disease) 02/27/2016  . Anxiety 02/27/2016  . S/P total knee replacement 02/13/2014  . RBBB 02/08/2014  . HTN (hypertension) 02/08/2014  . PVD - bilateral 60-79% carotid strenosis 02/08/2014  . Mixed hyperlipidemia 11/29/2013  . Carpal tunnel syndrome, bilateral 11/23/2013  . Murmur- mild -mod AR, mild MR 11/10/2013  . Constipation 12/16/2012    Conditions to be addressed/monitored:HTN and Cancer  Care Plan :  Cancer Posttreatment Phase (Adult)  Updates made by Knox Royalty, RN since 10/30/2020 12:00 AM    Problem: Pain   Priority: Medium    Long-Range Goal: Pain and emotions managed   Start Date: 10/10/2020  Expected End Date: 01/10/2021  This Visit's Progress: On track  Recent Progress: On track  Priority: Medium  Note:   Current Barriers:   Recent surgery x 2 for cancer treatment- ongoing recuperation/ pain  Clinical Goal(s):  Marland Kitchen Collaboration with [name], MD regarding development and update of comprehensive plan of care as evidenced by provider attestation and co-signature . Inter-disciplinary care team collaboration (see longitudinal plan of care) Over the next 3 months, patient will: . work with care management team to address care coordination and chronic disease management needs related to diagnosis and treatment plan of cancer with metastases to brain and pain control/ emotional lability after 2 recent surgeries Interventions:   Evaluation of current treatment plan related to cancer treatment, post-op course, pain control, emotional health self-management and patient's adherence to plan as established by provider.  Collaboration with [name], MD regarding development and update of comprehensive plan of care as  evidenced by provider attestation       and co-signature  Inter-disciplinary care team collaboration (see longitudinal plan of care) . Chart reviewed including relevant office notes, upcoming scheduled appointments, and lab results . Discussed current clinical condition with patient and caregiver/ daughter: both deny current clinical concerns; patient acknowledges that her pain is under good control with current therapies; she confirms that she has a scheduled appointment with psychiatric provider; confirmed that patient's previously reported foot blisters are "better/ improved" after office visit with podiatry . Reviewed recent provider appointments with patient/  caregiver . Full medication review, with confirmation that patient has started recently prescribed medications; no medication discrepancies/ issues identified; patient and caregiver deny medication concerns; medication list updated in EHR according to patient/ caregiver report . Discussed patient's progress with managing emotional lability: she confirms "good/ bad days" with minimal improvement since starting clonopin several days ago- encouraged her to give medication time to get in system/ begin working; patient reports appetite better with increased Remeron qHS, as well as slight improvement in sleep hygiene.  Encouraged ongoing journaling and self-grace practices while she awaits scheduled psychiatry provider appointment: confirmed patient is on cancellation list with psychiatry provider, in hopes of obtaining sooner appointment . Reviewed upcoming provider appointments with patient and confirmed that patient has plans to attend all as scheduled: new round of chemo to start 11/08/20; MRI brain 11/16/20; Dr. Roxan Hockey 11/20/20; Dr. Amalia Greenhouse 11/21/20; Dr. Mickeal Skinner 11/22/20; CCM pharmacy team 11/23/20; confirmed daughter continues assisting with transportation/ attending appointments with patient  Discussed plans with patient for ongoing care management follow up and provided patient with direct contact information for care management team Self Care Activities:  . Patient verbalizes understanding of plan to continue keeping care providers updated on her progress/ and to call for any questions or concerns . Attends all scheduled provider appointments . Calls pharmacy for medication refills . Performs ADL's and iADL's independently with minimal assistance as indicated from family members Patient Goals: Marland Kitchen Great job managing your medications at home; continue to call for prescription refill 3 days before needed . I am glad that your personal pain management plan has been working- if you have changes in your pain  levels, please call your care providers . Do something like read, listen to music or watch television to keep mind off pain and emotions . Plan exercise or activity when pain is best controlled: great job being active and getting out when you can each week . Prioritize tasks for the day . Track times pain and emotional distress is worst and when it is best . Track what makes the pain and emotional distress worse and what makes it better . Work slower and less intense when having pain- keep taking your time and be conservative on the days that you aren't feeling 100%  . I am very glad that Alden's pain is under control: please keep Dr. Quay Burow and the oncology team updated on her pain levels, especially if there are changes  . Glad to hear that Meliza's foot blisters were evaluated by the podiatrist and are better! . Keep completing your journal about your emotions/ rest/ feelings so you can share this with Dr. Casimiro Needle during your upcoming scheduled appointment Follow Up Plan:  . Telephone follow up appointment with care management team member scheduled for: December 27, 2020 at 2:00 pm . The patient has been provided with contact information for the care management team and has been advised to call with any health related questions or  concerns.     Care Plan : Hypertension (Adult)  Updates made by Knox Royalty, RN since 10/30/2020 12:00 AM    Problem: Hypertension (Hypertension)   Priority: Medium    Long-Range Goal: Hypertension Monitored   Start Date: 10/10/2020  Expected End Date: 04/12/2021  This Visit's Progress: On track  Recent Progress: On track  Priority: Medium  Note:   Objective:  . Last practice recorded BP readings:  BP Readings from Last 3 Encounters:  10/09/20 117/70  09/28/20 131/69  09/20/20 112/78   . Most recent eGFR/CrCl: No results found for: EGFR  No components found for: CRCL Current Barriers:  Marland Kitchen Knowledge Deficits related to basic understanding of hypertension  pathophysiology and self care management- patient/ caregiver could benefit form ongoing reinforcement of HTN management/ plan of care . Recent surgeries for new diagnosis of cancer with metastases to brain; ongoing recuperation with physical and emotional aspects of dealing with new diagnosis while managing chronic conditions Case Manager Clinical Goal(s):  Over the next 6 months, patient will: Marland Kitchen Demonstrate ongoing adherence to prescribed treatment plan for hypertension in setting of new cancer diagnosis/ treatment plan, as evidenced by taking all medications as prescribed, monitoring and recording blood pressure as directed, adhering to low sodium/DASH diet . Demonstrate improved health management independence as evidenced by checking blood pressure as directed and notifying care providers for concerns and /or questions Interventions:  . Collaboration with [name], MD regarding development and update of comprehensive plan of care as evidenced by provider attestation and co-signature . Inter-disciplinary care team collaboration (see longitudinal plan of care) . Chart reviewed including relevant office notes, upcoming scheduled appointments, and lab results . Re-confirmed caregiver continues monitoring blood pressures at home: denies concerns around same today; positive reinforcement provided- encouraged to continue monitoring blood pressures at home . Reviewed recent provider appointments, recent blood pressures during appointments and at home- no concerns identified: blood pressures maintained between 110-140/65-80 . Confirmed patient continues to follow heart healthy, low sodium diet and that her appetite has slowly starting improving, along with desired weight gain; reports most recent weight of 111 lbs- positive reinforcement provided Self-Care Activities: . Self administers medications as prescribed- receives minimal assistance from caregivers with fillinig of weekly pill box and reminding  to take when due . Attends all scheduled provider appointments . Calls provider office for new concerns, questions, or BP outside discussed parameters . Checks BP and records as discussed . Follows a low sodium diet/DASH diet Patient Goals: Marland Kitchen Great job monitoring your blood pressure regularly- keep up the great work: glad to hear that recent blood pressures at home have been stable! . Continue checking blood pressure at least 3 times per week . Write blood pressure results in a log or diary- we will review your blood pressures during our follow up calls . Continue taking your medications as prescribed . Continue following a low salt heart healthy diet- I am glad you have seen an improvement in your appetite and have gained more weight . As you continue recuperating from your recent surgeries, try to gradually increase your activity; pace activity with periods of rest and don't over-do activity . Contact your care providers/ doctors if you have changes or concerns about your blood pressure readings at home Follow Up Plan:  Telephone follow up appointment with care management team member scheduled for: December 27, 2020 at 2:00 pm The patient has been provided with contact information for the care management team and has been advised  to call with any health related questions or concerns.       Plan:  Telephone follow up appointment with care management team member scheduled for: Thursday December 27, 2020 at 2:00 pm  The patient has been provided with contact information for the care management team and has been advised to call with any health related questions or concerns.   [name], RN, BSN, [facility] RN Care Coordination- [place] [phone]: direct office [phone]: mobile

## 2020-11-06 ENCOUNTER — Encounter (HOSPITAL_COMMUNITY): Payer: Self-pay | Admitting: Internal Medicine

## 2020-11-06 ENCOUNTER — Inpatient Hospital Stay: Payer: Medicare HMO | Attending: Internal Medicine

## 2020-11-06 ENCOUNTER — Other Ambulatory Visit: Payer: Self-pay

## 2020-11-06 ENCOUNTER — Inpatient Hospital Stay: Payer: Medicare HMO

## 2020-11-06 DIAGNOSIS — K219 Gastro-esophageal reflux disease without esophagitis: Secondary | ICD-10-CM | POA: Insufficient documentation

## 2020-11-06 DIAGNOSIS — Z5111 Encounter for antineoplastic chemotherapy: Secondary | ICD-10-CM | POA: Diagnosis not present

## 2020-11-06 DIAGNOSIS — F32A Depression, unspecified: Secondary | ICD-10-CM | POA: Insufficient documentation

## 2020-11-06 DIAGNOSIS — C3411 Malignant neoplasm of upper lobe, right bronchus or lung: Secondary | ICD-10-CM | POA: Diagnosis not present

## 2020-11-06 DIAGNOSIS — R635 Abnormal weight gain: Secondary | ICD-10-CM | POA: Diagnosis not present

## 2020-11-06 DIAGNOSIS — Z8379 Family history of other diseases of the digestive system: Secondary | ICD-10-CM | POA: Insufficient documentation

## 2020-11-06 DIAGNOSIS — Z87891 Personal history of nicotine dependence: Secondary | ICD-10-CM | POA: Diagnosis not present

## 2020-11-06 DIAGNOSIS — C7931 Secondary malignant neoplasm of brain: Secondary | ICD-10-CM | POA: Diagnosis not present

## 2020-11-06 DIAGNOSIS — Z803 Family history of malignant neoplasm of breast: Secondary | ICD-10-CM | POA: Insufficient documentation

## 2020-11-06 DIAGNOSIS — I10 Essential (primary) hypertension: Secondary | ICD-10-CM | POA: Diagnosis not present

## 2020-11-06 DIAGNOSIS — Z5112 Encounter for antineoplastic immunotherapy: Secondary | ICD-10-CM | POA: Insufficient documentation

## 2020-11-06 DIAGNOSIS — Z8719 Personal history of other diseases of the digestive system: Secondary | ICD-10-CM | POA: Insufficient documentation

## 2020-11-06 DIAGNOSIS — Z888 Allergy status to other drugs, medicaments and biological substances status: Secondary | ICD-10-CM | POA: Insufficient documentation

## 2020-11-06 DIAGNOSIS — Z79899 Other long term (current) drug therapy: Secondary | ICD-10-CM | POA: Insufficient documentation

## 2020-11-06 DIAGNOSIS — F419 Anxiety disorder, unspecified: Secondary | ICD-10-CM | POA: Diagnosis not present

## 2020-11-06 DIAGNOSIS — Z823 Family history of stroke: Secondary | ICD-10-CM | POA: Diagnosis not present

## 2020-11-06 DIAGNOSIS — Z902 Acquired absence of lung [part of]: Secondary | ICD-10-CM | POA: Diagnosis not present

## 2020-11-06 DIAGNOSIS — Z8042 Family history of malignant neoplasm of prostate: Secondary | ICD-10-CM | POA: Insufficient documentation

## 2020-11-06 DIAGNOSIS — E785 Hyperlipidemia, unspecified: Secondary | ICD-10-CM | POA: Insufficient documentation

## 2020-11-06 DIAGNOSIS — E119 Type 2 diabetes mellitus without complications: Secondary | ICD-10-CM | POA: Insufficient documentation

## 2020-11-06 DIAGNOSIS — Z833 Family history of diabetes mellitus: Secondary | ICD-10-CM | POA: Insufficient documentation

## 2020-11-06 LAB — CMP (CANCER CENTER ONLY)
ALT: 8 U/L (ref 0–44)
AST: 15 U/L (ref 15–41)
Albumin: 3.8 g/dL (ref 3.5–5.0)
Alkaline Phosphatase: 78 U/L (ref 38–126)
Anion gap: 10 (ref 5–15)
BUN: 13 mg/dL (ref 8–23)
CO2: 32 mmol/L (ref 22–32)
Calcium: 10 mg/dL (ref 8.9–10.3)
Chloride: 103 mmol/L (ref 98–111)
Creatinine: 0.73 mg/dL (ref 0.44–1.00)
GFR, Estimated: >60 mL/min (ref >60)
Glucose, Bld: 105 mg/dL — ABNORMAL HIGH (ref 70–99)
Potassium: 4 mmol/L (ref 3.5–5.1)
Sodium: 145 mmol/L (ref 135–145)
Total Bilirubin: <0.2 mg/dL — ABNORMAL LOW (ref 0.3–1.2)
Total Protein: 7.1 g/dL (ref 6.5–8.1)

## 2020-11-06 LAB — CBC WITH DIFFERENTIAL (CANCER CENTER ONLY)
Abs Immature Granulocytes: 0.01 10*3/uL (ref 0.00–0.07)
Basophils Absolute: 0 10*3/uL (ref 0.0–0.1)
Basophils Relative: 1 %
Eosinophils Absolute: 0.3 10*3/uL (ref 0.0–0.5)
Eosinophils Relative: 4 %
HCT: 34.9 % — ABNORMAL LOW (ref 36.0–46.0)
Hemoglobin: 11 g/dL — ABNORMAL LOW (ref 12.0–15.0)
Immature Granulocytes: 0 %
Lymphocytes Relative: 37 %
Lymphs Abs: 2.3 10*3/uL (ref 0.7–4.0)
MCH: 26.7 pg (ref 26.0–34.0)
MCHC: 31.5 g/dL (ref 30.0–36.0)
MCV: 84.7 fL (ref 80.0–100.0)
Monocytes Absolute: 0.6 10*3/uL (ref 0.1–1.0)
Monocytes Relative: 10 %
Neutro Abs: 3 10*3/uL (ref 1.7–7.7)
Neutrophils Relative %: 48 %
Platelet Count: 252 10*3/uL (ref 150–400)
RBC: 4.12 MIL/uL (ref 3.87–5.11)
RDW: 13.9 % (ref 11.5–15.5)
WBC Count: 6.2 10*3/uL (ref 4.0–10.5)
nRBC: 0 % (ref 0.0–0.2)

## 2020-11-07 ENCOUNTER — Telehealth: Payer: Self-pay | Admitting: Medical Oncology

## 2020-11-07 NOTE — Telephone Encounter (Signed)
I returned Hannah [name]'s and left message to return my call.Hannah [name]

## 2020-11-07 NOTE — Telephone Encounter (Signed)
Pts daughter has now called back and advised after they "slept on it" they are okay with her having dexamethasone because she did have good response to it previously and they no longer have concerns about it. She states she wants to make sure everyone is aware that it is okay for the pt to have the medication.  I have called pts daughter back and acknowledged that her message has been received.

## 2020-11-07 NOTE — Progress Notes (Signed)
[place] OFFICE PROGRESS NOTE  [name], MD [place] [address]  DIAGNOSIS: Stage IVnon-small cell lung cancer(T, N0, M1C)adenocarcinoma. The patient presented with a right upper lobe lung mass and solitary brain metastasis. The patient was diagnosed in February 2022.  Biomarker Findings Microsatellite status - MS-Stable Tumor Mutational Burden - 8 Muts/Mb Genomic Findings For a complete list of the genes assayed, please refer to the Appendix. KRAS G12V KEAP1 S224F TP53 P151T 7 Disease relevant genes with no reportable alterations: ALK, BRAF, EGFR, ERBB2, MET, RET, ROS1  PDL1 Expression: 90%  PRIOR THERAPY: 1)SRS to the metastatic brain lesion on 2/24/2022under the care of Dr. Lenice Llamas craniotomy under the care of Dr. Zada Finders on 08/24/2020. 2) S/p robotic assisted right upper lobectomy with en bloc wedge resection of the right middle lobe and lymph node dissection under the care of Dr. Roxan Hockey on September 24, 2020  CURRENT THERAPY: Systemic chemotherapy with carboplatin for AUC of 5, Alimta 500 Mg/M2 and Keytruda 200 mg IV every 3 weeks.  First dose Nov 08, 2020.   INTERVAL HISTORY: Hannah [name] 70 y.o. female returns to the clinic today for a follow-up visit accompanied by her daughter.  The patient is feeling fairly well today without any concerning complaints. The patient is here today to start her first cycle of adjuvant chemotherapy. She denies any fever, chills, night sweats, or weight loss. She has been gaining weight. She denies any chest pain, shortness of breath, cough, or hemoptysis. She has recovered well from her surgery and no longer has to take any pain medication or tylenol. She she denies any nausea, vomiting, diarrhea, or constipation.  She denies any rashes or skin changes.  She is concerned about the steroid premedication due to it making her anxious. However, even when she completed her steroid taper she  still has some anxiety. She takes klonopin for anxiety and is scheduled to see a psychiatrist later this month. She denies headaches or visual changes. She is followed closely by neuro-oncology for her history of brain metastasis and has a follow up brain MRI and appointment with Dr. Mickeal Skinner later this month.   The patient is here today for evaluation and repeat blood work before starting her first cycle of treatment.     MEDICAL HISTORY: Past Medical History:  Diagnosis Date  . Allergy   . Anemia   . Anxiety   . Arthritis   . Carpal tunnel syndrome   . Constipation    senna C stool softeners help   . Depression   . Diverticulosis   . Dyslipidemia   . External hemorrhoids   . GERD (gastroesophageal reflux disease)   . Heart murmur    mild-moderate AR  . Hiatal hernia   . Hyperlipidemia    on meds   . Hypertension   . Internal hemorrhoids   . Osteoarthritis   . Pre-diabetes   . PVD (peripheral vascular disease) (HCC)    moderate carotid disease  . RBBB   . Smoker   . Vocal cord polyps     ALLERGIES:  is allergic to amlodipine, chantix [varenicline tartrate], clarithromycin, lisinopril, simvastatin, wellbutrin [bupropion hcl], lipitor [atorvastatin], and sertraline.  MEDICATIONS:  Current Outpatient Medications  Medication Sig Dispense Refill  . acetaminophen (TYLENOL) 500 MG tablet Take 2 tablets (1,000 mg total) by mouth every 6 (six) hours. 30 tablet 0  . Ascorbic Acid (VITAMIN C) 1000 MG tablet Take 1,000 mg by mouth daily.    Marland Kitchen  aspirin 81 MG tablet Take 1 tablet (81 mg total) by mouth daily. Restart on 08/31/20 30 tablet   . carboxymethylcellulose (REFRESH PLUS) 0.5 % SOLN Place 1-2 drops into both eyes daily.    . clonazePAM (KLONOPIN) 0.5 MG tablet Take 1 tablet (0.5 mg total) by mouth 2 (two) times daily as needed for anxiety. 60 tablet 1  . folic acid (FOLVITE) 1 MG tablet Take 1 tablet (1 mg total) by mouth daily. 30 tablet 4  . hydrochlorothiazide (HYDRODIURIL)  12.5 MG tablet Take 1 tablet (12.5 mg total) by mouth daily. 90 tablet 1  . ketoconazole (NIZORAL) 2 % cream Apply 1 application topically 2 (two) times daily. 15 g 2  . loratadine (CLARITIN) 10 MG tablet Take 10 mg by mouth daily as needed for allergies.    . mirtazapine (REMERON) 30 MG tablet Take 1 tablet (30 mg total) by mouth at bedtime. 30 tablet 5  . neomycin-bacitracin-polymyxin (NEOSPORIN) ointment Apply 1 application topically as needed for wound care. (Patient not taking: Reported on 10/30/2020)    . omeprazole (PRILOSEC) 40 MG capsule Take 1 capsule (40 mg total) by mouth 2 (two) times daily before a meal. 180 capsule 1  . polyethylene glycol (MIRALAX / GLYCOLAX) 17 g packet Take 17 g by mouth 2 (two) times daily. 72 each 0  . pravastatin (PRAVACHOL) 40 MG tablet Take 1 tablet (40 mg total) by mouth every evening. 90 tablet 1  . Probiotic Product (PROBIOTIC DAILY PO) Take 1 capsule by mouth daily.    . prochlorperazine (COMPAZINE) 10 MG tablet Take 1 tablet (10 mg total) by mouth every 6 (six) hours as needed for nausea or vomiting. 30 tablet 0  . senna-docusate (SENOKOT-S) 8.6-50 MG tablet Take 1 tablet by mouth 2 (two) times daily. (Patient taking differently: Take 1 tablet by mouth daily as needed for mild constipation.)    . simethicone (MYLICON) 80 MG chewable tablet Chew 1 tablet (80 mg total) by mouth 4 (four) times daily as needed for flatulence (Bloating). 30 tablet 0  . traMADol (ULTRAM) 50 MG tablet Take 1-2 tablets (50-100 mg total) by mouth every 6 (six) hours as needed (mild pain). (Patient not taking: No sig reported) 30 tablet 0  . Wheat Dextrin (BENEFIBER) POWD Take 1 Scoop by mouth 2 (two) times daily as needed (constipation).     No current facility-administered medications for this visit.    SURGICAL HISTORY:  Past Surgical History:  Procedure Laterality Date  . ABDOMINAL HYSTERECTOMY  1994  . APPLICATION OF CRANIAL NAVIGATION N/A 08/24/2020   Procedure:  APPLICATION OF CRANIAL NAVIGATION;  Surgeon: Judith Part, MD;  Location: [facility];  Service: Neurosurgery;  Laterality: N/A;  . COLONOSCOPY    . COLONOSCOPY W/ POLYPECTOMY  2009  . CRANIOTOMY Left 08/24/2020   Procedure: Left Craniotomy for Tumor Resection with Brainlab;  Surgeon: Judith Part, MD;  Location: [facility];  Service: Neurosurgery;  Laterality: Left;  . HEMORRHOID SURGERY    . INTERCOSTAL NERVE BLOCK Right 09/24/2020   Procedure: INTERCOSTAL NERVE BLOCK;  Surgeon: Melrose Nakayama, MD;  Location: [facility];  Service: Thoracic;  Laterality: Right;  . JOINT REPLACEMENT    . LYMPH NODE DISSECTION Right 09/24/2020   Procedure: LYMPH NODE DISSECTION;  Surgeon: Melrose Nakayama, MD;  Location: [facility];  Service: Thoracic;  Laterality: Right;  . POLYPECTOMY    . right total knee arthroplasty     Dr. Emeterio Reeve 06-04-18  . SPINE SURGERY  08/16/2009  .  TOTAL KNEE ARTHROPLASTY Left 02/13/2014   Procedure: TOTAL KNEE ARTHROPLASTY;  Surgeon: Alta Corning, MD;  Location: [facility];  Service: Orthopedics;  Laterality: Left;  . TOTAL KNEE ARTHROPLASTY Right 06/04/2018   Procedure: RIGHT TOTAL KNEE ARTHROPLASTY;  Surgeon: Dorna Leitz, MD;  Location: [facility];  Service: Orthopedics;  Laterality: Right;  Adductor Block  . TUBAL LIGATION      REVIEW OF SYSTEMS:   Review of Systems  Constitutional: Negative for appetite change, chills, fatigue, fever and unexpected weight change.  HENT: Negative for mouth sores, nosebleeds, sore throat and trouble swallowing.   Eyes: Negative for eye problems and icterus.  Respiratory: Negative for cough, hemoptysis, shortness of breath and wheezing.   Cardiovascular: Negative for chest pain and leg swelling.  Gastrointestinal: Negative for abdominal pain, constipation, diarrhea, nausea and vomiting.  Genitourinary: Negative for bladder incontinence, difficulty urinating, dysuria, frequency and hematuria.   Musculoskeletal: Negative for back pain, gait problem,  neck pain and neck stiffness.  Skin: Negative for itching and rash.  Neurological: Negative for dizziness, extremity weakness, gait problem, headaches, light-headedness and seizures.  Hematological: Negative for adenopathy. Does not bruise/bleed easily.  Psychiatric/Behavioral: Negative for confusion, depression and sleep disturbance. The patient is not nervous/anxious.     PHYSICAL EXAMINATION:  Blood pressure 140/76, pulse 65, temperature (!) 97.2 F (36.2 C), temperature source Tympanic, resp. rate 18, height 5' (1.524 m), weight 116 lb 1.6 oz (52.7 kg), SpO2 100 %.  ECOG PERFORMANCE STATUS: 1 - Symptomatic but completely ambulatory  Physical Exam  Constitutional: Oriented to person, place, and time and thin appearing female and in no distress.  HENT:  Head: Normocephalic and atraumatic.  Mouth/Throat: Oropharynx is clear and moist. No oropharyngeal exudate.  Eyes: Conjunctivae are normal. Right eye exhibits no discharge. Left eye exhibits no discharge. No scleral icterus.  Neck: Normal range of motion. Neck supple.  Cardiovascular: Normal rate, regular rhythm, mild systolic murmur noted and intact distal pulses.   Pulmonary/Chest: Effort normal and breath sounds normal. No respiratory distress. No wheezes. No rales.  Abdominal: Soft. Bowel sounds are normal. Exhibits no distension and no mass. There is no tenderness.  Musculoskeletal: Normal range of motion. Exhibits no edema.  Lymphadenopathy:    No cervical adenopathy.  Neurological: Alert and oriented to person, place, and time. Exhibits normal muscle tone. Gait normal. Coordination normal.  Skin: Skin is warm and dry. No rash noted. Not diaphoretic. No erythema. No pallor.  Psychiatric: Mood, memory and judgment normal.  Vitals reviewed.  LABORATORY DATA: Lab Results  Component Value Date   WBC 6.2 11/06/2020   HGB 11.0 (L) 11/06/2020   HCT 34.9 (L) 11/06/2020   MCV 84.7 11/06/2020   PLT 252 11/06/2020      Chemistry       Component Value Date/Time   NA 145 11/06/2020 1542   K 4.0 11/06/2020 1542   CL 103 11/06/2020 1542   CO2 32 11/06/2020 1542   BUN 13 11/06/2020 1542   CREATININE 0.73 11/06/2020 1542   CREATININE 0.79 03/14/2020 1016      Component Value Date/Time   CALCIUM 10.0 11/06/2020 1542   ALKPHOS 78 11/06/2020 1542   AST 15 11/06/2020 1542   ALT 8 11/06/2020 1542   BILITOT <0.2 (L) 11/06/2020 1542       RADIOGRAPHIC STUDIES:  DG Chest 2 View  Result Date: 10/09/2020 CLINICAL DATA:  Lung cancer, status post right upper lobectomy EXAM: CHEST - 2 VIEW COMPARISON:  09/28/2020 FINDINGS: Previous right  upper lobectomy with volume loss in the right hemithorax and elevation of the right hemidiaphragm. Resolved right apical pneumothorax. No large effusion. Stable aeration without focal pneumonia or collapse. Normal heart size and vascularity. Trachea midline. Degenerative changes of the spine. Lumbar fusion hardware partially imaged. IMPRESSION: Resolved right apical pneumothorax. Postoperative findings as above. No acute finding by plain radiography. Electronically Signed   By: Jerilynn Mages.  Shick M.D.   On: 10/09/2020 10:04     ASSESSMENT/PLAN:  This is a very pleasant 69 year old African-American female diagnosed with stage IV (T3, N0, M1 B) non-small cell lung cancer, adenocarcinoma.  She presented with a right upper lobe lung mass with a solitary brain metastasis.  She was diagnosed in February 2022.  Her PD-L1 expression is 90%.  She does not have any actionable mutations.  The patient completed SRS followed by craniotomy and resection on 2/24/2022under the care of Dr. Lenice Llamas craniotomy under the care of Dr. Zada Finders on 08/24/2020.  She then had a robot-assisted right upper lobectomy with en bloc wedge resection of the right middle lobe and lymph node dissection under the care of Dr. Roxan Hockey on September 24, 2020.  The patient is currently undergoing systemic chemotherapy with 4-6 cycles of  carboplatin for an AUC of 5, Alimta 500 mg per metered squared, Keytruda 200 mg IV every 3 weeks.  She is here today for her first cycle of treatment.   Labs were reviewed.  Recommend that she proceed with cycle #1 today scheduled.  Discussed the purpose of the decadron pre-medication. She is agreeable to proceed with this as planned. If she does not tolerate this, then we can always adjust it in the future.   We will see her back for a 1 week follow-up visit to manage any adverse side effects of treatment.  She takes klonopin for anxiety as prescribed by her PCP. She will meet with her psychiatrist later this month for anxiety.   She also will have a repeat brain MRI as planned and follow up with Dr. Mickeal Skinner as scheduled.   The patient was advised to call immediately if she has any concerning symptoms in the interval. The patient voices understanding of current disease status and treatment options and is in agreement with the current care plan. All questions were answered. The patient knows to call the clinic with any problems, questions or concerns. We can certainly see the patient much sooner if necessary      Orders Placed This Encounter  Procedures  . CBC with Differential (Cancer Center Only)    Standing Status:   Standing    Number of Occurrences:   12    Standing Expiration Date:   11/08/2021  . CMP ([place] [facility] only)    Standing Status:   Standing    Number of Occurrences:   12    Standing Expiration Date:   11/08/2021  . TSH    Standing Status:   Standing    Number of Occurrences:   4    Standing Expiration Date:   11/08/2021     I spent 20-29 minutes in the encounter.   Ameyah Bangura L Kellin Fifer, PA-C 11/08/20

## 2020-11-07 NOTE — Telephone Encounter (Signed)
Dtr states ,Hannah [name] was intolerant of Decadron 4 mg after one and only one dose after her surgery- "extreme anxiety , jittery ,fear, heart palpitations".   She is asking if it  is necessary  to give her decadron as a premed.   PCP prescribed Clonidine.for her anxiety,

## 2020-11-08 ENCOUNTER — Encounter: Payer: Self-pay | Admitting: Physician Assistant

## 2020-11-08 ENCOUNTER — Inpatient Hospital Stay: Payer: Medicare HMO

## 2020-11-08 ENCOUNTER — Inpatient Hospital Stay: Payer: Medicare HMO | Admitting: Physician Assistant

## 2020-11-08 ENCOUNTER — Other Ambulatory Visit: Payer: Self-pay

## 2020-11-08 VITALS — BP 140/76 | HR 65 | Temp 97.2°F | Resp 18 | Ht 60.0 in | Wt 116.1 lb

## 2020-11-08 DIAGNOSIS — Z5111 Encounter for antineoplastic chemotherapy: Secondary | ICD-10-CM

## 2020-11-08 DIAGNOSIS — Z5112 Encounter for antineoplastic immunotherapy: Secondary | ICD-10-CM

## 2020-11-08 DIAGNOSIS — C3411 Malignant neoplasm of upper lobe, right bronchus or lung: Secondary | ICD-10-CM

## 2020-11-08 DIAGNOSIS — R635 Abnormal weight gain: Secondary | ICD-10-CM | POA: Diagnosis not present

## 2020-11-08 DIAGNOSIS — E785 Hyperlipidemia, unspecified: Secondary | ICD-10-CM | POA: Diagnosis not present

## 2020-11-08 DIAGNOSIS — E119 Type 2 diabetes mellitus without complications: Secondary | ICD-10-CM | POA: Diagnosis not present

## 2020-11-08 DIAGNOSIS — F419 Anxiety disorder, unspecified: Secondary | ICD-10-CM | POA: Diagnosis not present

## 2020-11-08 DIAGNOSIS — C7931 Secondary malignant neoplasm of brain: Secondary | ICD-10-CM

## 2020-11-08 DIAGNOSIS — I10 Essential (primary) hypertension: Secondary | ICD-10-CM | POA: Diagnosis not present

## 2020-11-08 MED ORDER — SODIUM CHLORIDE 0.9 % IV SOLN
330.0000 mg | Freq: Once | INTRAVENOUS | Status: AC
  Administered 2020-11-08: 330 mg via INTRAVENOUS
  Filled 2020-11-08: qty 33

## 2020-11-08 MED ORDER — SODIUM CHLORIDE 0.9 % IV SOLN
500.0000 mg/m2 | Freq: Once | INTRAVENOUS | Status: AC
  Administered 2020-11-08: 700 mg via INTRAVENOUS
  Filled 2020-11-08: qty 20

## 2020-11-08 MED ORDER — SODIUM CHLORIDE 0.9 % IV SOLN
Freq: Once | INTRAVENOUS | Status: AC
  Filled 2020-11-08: qty 250

## 2020-11-08 MED ORDER — SODIUM CHLORIDE 0.9 % IV SOLN
200.0000 mg | Freq: Once | INTRAVENOUS | Status: AC
  Administered 2020-11-08: 200 mg via INTRAVENOUS
  Filled 2020-11-08: qty 8

## 2020-11-08 MED ORDER — SODIUM CHLORIDE 0.9 % IV SOLN
150.0000 mg | Freq: Once | INTRAVENOUS | Status: AC
  Administered 2020-11-08: 150 mg via INTRAVENOUS
  Filled 2020-11-08: qty 150

## 2020-11-08 MED ORDER — SODIUM CHLORIDE 0.9 % IV SOLN
10.0000 mg | Freq: Once | INTRAVENOUS | Status: AC
  Administered 2020-11-08: 10 mg via INTRAVENOUS
  Filled 2020-11-08: qty 10

## 2020-11-08 MED ORDER — PALONOSETRON HCL INJECTION 0.25 MG/5ML
0.2500 mg | Freq: Once | INTRAVENOUS | Status: AC
  Administered 2020-11-08: 0.25 mg via INTRAVENOUS

## 2020-11-08 MED ORDER — PALONOSETRON HCL INJECTION 0.25 MG/5ML
INTRAVENOUS | Status: AC
  Filled 2020-11-08: qty 5

## 2020-11-08 NOTE — Progress Notes (Signed)
Note charted in [place]

## 2020-11-08 NOTE — Patient Instructions (Addendum)
[facility]  Discharge Instructions: Thank you for choosing [facility] to provide your oncology and hematology care.   If you have a lab appointment with the [facility], please go directly to the [facility] and check in at the registration area.   Wear comfortable clothing and clothing appropriate for easy access to any Portacath or PICC line.   We strive to give you quality time with your provider. You may need to reschedule your appointment if you arrive late (15 or more minutes).  Arriving late affects you and other patients whose appointments are after yours.  Also, if you miss three or more appointments without notifying the office, you may be dismissed from the clinic at the provider's discretion.      For prescription refill requests, have your pharmacy contact our office and allow 72 hours for refills to be completed.    Today you received the following chemotherapy and/or immunotherapy agents keytruda; alimnta; carboplatin    To help prevent nausea and vomiting after your treatment, we encourage you to take your nausea medication as directed.  BELOW ARE SYMPTOMS THAT SHOULD BE REPORTED IMMEDIATELY: . *FEVER GREATER THAN 100.4 F (38 C) OR HIGHER . *CHILLS OR SWEATING . *NAUSEA AND VOMITING THAT IS NOT CONTROLLED WITH YOUR NAUSEA MEDICATION . *UNUSUAL SHORTNESS OF BREATH . *UNUSUAL BRUISING OR BLEEDING . *URINARY PROBLEMS (pain or burning when urinating, or frequent urination) . *BOWEL PROBLEMS (unusual diarrhea, constipation, pain near the anus) . TENDERNESS IN MOUTH AND THROAT WITH OR WITHOUT PRESENCE OF ULCERS (sore throat, sores in mouth, or a toothache) . UNUSUAL RASH, SWELLING OR PAIN  . UNUSUAL VAGINAL DISCHARGE OR ITCHING   Items with * indicate a potential emergency and should be followed up as soon as possible or go to the Emergency Department if any problems should occur.  Please show the CHEMOTHERAPY ALERT CARD or  IMMUNOTHERAPY ALERT CARD at check-in to the Emergency Department and triage nurse.  Should you have questions after your visit or need to cancel or reschedule your appointment, please contact [facility]  Dept: [phone]  and follow the prompts.  Office hours are 8:00 a.m. to 4:30 p.m. Monday - Friday. Please note that voicemails left after 4:00 p.m. may not be returned until the following business day.  We are closed weekends and major holidays. You have access to a nurse at all times for urgent questions. Please call the main number to the clinic Dept: [phone] and follow the prompts.   For any non-urgent questions, you may also contact your provider using MyChart. We now offer e-Visits for anyone 70 and older to request care online for non-urgent symptoms. For details visit [email or web address].   Also download the MyChart app! Go to the app store, search "MyChart", open the app, select [facility], and log in with your MyChart username and password.  Due to Covid, a mask is required upon entering the hospital/clinic. If you do not have a mask, one will be given to you upon arrival. For doctor visits, patients may have 1 support person aged 95 or older with them. For treatment visits, patients cannot have anyone with them due to current Covid guidelines and our immunocompromised population.   Pembrolizumab injection What is this medicine? PEMBROLIZUMAB (pem broe liz ue mab) is a monoclonal antibody. It is used to treat certain types of cancer. This medicine may be used for other purposes; ask your health care provider or  pharmacist if you have questions. COMMON BRAND NAME(S): Keytruda What should I tell my health care provider before I take this medicine? They need to know if you have any of these conditions:  autoimmune diseases like Crohn's disease, ulcerative colitis, or lupus  have had or planning to have an allogeneic stem cell transplant (uses  someone else's stem cells)  history of organ transplant  history of chest radiation  nervous system problems like myasthenia gravis or Guillain-Barre syndrome  an unusual or allergic reaction to pembrolizumab, other medicines, foods, dyes, or preservatives  pregnant or trying to get pregnant  breast-feeding How should I use this medicine? This medicine is for infusion into a vein. It is given by a health care professional in a hospital or clinic setting. A special MedGuide will be given to you before each treatment. Be sure to read this information carefully each time. Talk to your pediatrician regarding the use of this medicine in children. While this drug may be prescribed for children as young as 6 months for selected conditions, precautions do apply. Overdosage: If you think you have taken too much of this medicine contact a poison control center or emergency room at once. NOTE: This medicine is only for you. Do not share this medicine with others. What if I miss a dose? It is important not to miss your dose. Call your doctor or health care professional if you are unable to keep an appointment. What may interact with this medicine? Interactions have not been studied. This list may not describe all possible interactions. Give your health care provider a list of all the medicines, herbs, non-prescription drugs, or dietary supplements you use. Also tell them if you smoke, drink alcohol, or use illegal drugs. Some items may interact with your medicine. What should I watch for while using this medicine? Your condition will be monitored carefully while you are receiving this medicine. You may need blood work done while you are taking this medicine. Do not become pregnant while taking this medicine or for 4 months after stopping it. Women should inform their doctor if they wish to become pregnant or think they might be pregnant. There is a potential for serious side effects to an unborn  child. Talk to your health care professional or pharmacist for more information. Do not breast-feed an infant while taking this medicine or for 4 months after the last dose. What side effects may I notice from receiving this medicine? Side effects that you should report to your doctor or health care professional as soon as possible:  allergic reactions like skin rash, itching or hives, swelling of the face, lips, or tongue  bloody or black, tarry  breathing problems  changes in vision  chest pain  chills  confusion  constipation  cough  diarrhea  dizziness or feeling faint or lightheaded  fast or irregular heartbeat  fever  flushing  joint pain  low blood counts - this medicine may decrease the number of white blood cells, red blood cells and platelets. You may be at increased risk for infections and bleeding.  muscle pain  muscle weakness  pain, tingling, numbness in the hands or feet  persistent headache  redness, blistering, peeling or loosening of the skin, including inside the mouth  signs and symptoms of high blood sugar such as dizziness; dry mouth; dry skin; fruity breath; nausea; stomach pain; increased hunger or thirst; increased urination  signs and symptoms of kidney injury like trouble passing urine or change in the  amount of urine  signs and symptoms of liver injury like dark urine, light-colored stools, loss of appetite, nausea, right upper belly pain, yellowing of the eyes or skin  sweating  swollen lymph nodes  weight loss Side effects that usually do not require medical attention (report to your doctor or health care professional if they continue or are bothersome):  decreased appetite  hair loss  tiredness This list may not describe all possible side effects. Call your doctor for medical advice about side effects. You may report side effects to FDA at 1-800-FDA-1088. Where should I keep my medicine? This drug is given in a hospital  or clinic and will not be stored at home. NOTE: This sheet is a summary. It may not cover all possible information. If you have questions about this medicine, talk to your doctor, pharmacist, or health care provider.  2021 Elsevier/Gold Standard (2019-05-18 21:44:53)  Pemetrexed injection What is this medicine? PEMETREXED (PEM e TREX ed) is a chemotherapy drug used to treat lung cancers like non-small cell lung cancer and mesothelioma. It may also be used to treat other cancers. This medicine may be used for other purposes; ask your health care provider or pharmacist if you have questions. COMMON BRAND NAME(S): Alimta What should I tell my health care provider before I take this medicine? They need to know if you have any of these conditions:  infection (especially a virus infection such as chickenpox, cold sores, or herpes)  kidney disease  low blood counts, like low white cell, platelet, or red cell counts  lung or breathing disease, like asthma  radiation therapy  an unusual or allergic reaction to pemetrexed, other medicines, foods, dyes, or preservative  pregnant or trying to get pregnant  breast-feeding How should I use this medicine? This drug is given as an infusion into a vein. It is administered in a hospital or clinic by a specially trained health care professional. Talk to your pediatrician regarding the use of this medicine in children. Special care may be needed. Overdosage: If you think you have taken too much of this medicine contact a poison control center or emergency room at once. NOTE: This medicine is only for you. Do not share this medicine with others. What if I miss a dose? It is important not to miss your dose. Call your doctor or health care professional if you are unable to keep an appointment. What may interact with this medicine? This medicine may interact with the following medications:  Ibuprofen This list may not describe all possible  interactions. Give your health care provider a list of all the medicines, herbs, non-prescription drugs, or dietary supplements you use. Also tell them if you smoke, drink alcohol, or use illegal drugs. Some items may interact with your medicine. What should I watch for while using this medicine? Visit your doctor for checks on your progress. This drug may make you feel generally unwell. This is not uncommon, as chemotherapy can affect healthy cells as well as cancer cells. Report any side effects. Continue your course of treatment even though you feel ill unless your doctor tells you to stop. In some cases, you may be given additional medicines to help with side effects. Follow all directions for their use. Call your doctor or health care professional for advice if you get a fever, chills or sore throat, or other symptoms of a cold or flu. Do not treat yourself. This drug decreases your body's ability to fight infections. Try to avoid being  around people who are sick. This medicine may increase your risk to bruise or bleed. Call your doctor or health care professional if you notice any unusual bleeding. Be careful brushing and flossing your teeth or using a toothpick because you may get an infection or bleed more easily. If you have any dental work done, tell your dentist you are receiving this medicine. Avoid taking products that contain aspirin, acetaminophen, ibuprofen, naproxen, or ketoprofen unless instructed by your doctor. These medicines may hide a fever. Call your doctor or health care professional if you get diarrhea or mouth sores. Do not treat yourself. To protect your kidneys, drink water or other fluids as directed while you are taking this medicine. Do not become pregnant while taking this medicine or for 6 months after stopping it. Women should inform their doctor if they wish to become pregnant or think they might be pregnant. Men should not father a child while taking this medicine and  for 3 months after stopping it. This may interfere with the ability to father a child. You should talk to your doctor or health care professional if you are concerned about your fertility. There is a potential for serious side effects to an unborn child. Talk to your health care professional or pharmacist for more information. Do not breast-feed an infant while taking this medicine or for 1 week after stopping it. What side effects may I notice from receiving this medicine? Side effects that you should report to your doctor or health care professional as soon as possible:  allergic reactions like skin rash, itching or hives, swelling of the face, lips, or tongue  breathing problems  redness, blistering, peeling or loosening of the skin, including inside the mouth  signs and symptoms of bleeding such as bloody or black, tarry stools; red or dark-brown urine; spitting up blood or brown material that looks like coffee grounds; red spots on the skin; unusual bruising or bleeding from the eye, gums, or nose  signs and symptoms of infection like fever or chills; cough; sore throat; pain or trouble passing urine  signs and symptoms of kidney injury like trouble passing urine or change in the amount of urine  signs and symptoms of liver injury like dark yellow or brown urine; general ill feeling or flu-like symptoms; light-colored stools; loss of appetite; nausea; right upper belly pain; unusually weak or tired; yellowing of the eyes or skin Side effects that usually do not require medical attention (report to your doctor or health care professional if they continue or are bothersome):  constipation  mouth sores  nausea, vomiting  unusually weak or tired This list may not describe all possible side effects. Call your doctor for medical advice about side effects. You may report side effects to FDA at 1-800-FDA-1088. Where should I keep my medicine? This drug is given in a hospital or clinic and  will not be stored at home. NOTE: This sheet is a summary. It may not cover all possible information. If you have questions about this medicine, talk to your doctor, pharmacist, or health care provider.  2021 Elsevier/Gold Standard (2017-08-05 16:11:33)  Carboplatin injection What is this medicine? CARBOPLATIN (KAR boe pla tin) is a chemotherapy drug. It targets fast dividing cells, like cancer cells, and causes these cells to die. This medicine is used to treat ovarian cancer and many other cancers. This medicine may be used for other purposes; ask your health care provider or pharmacist if you have questions. COMMON BRAND NAME(S): Paraplatin  What should I tell my health care provider before I take this medicine? They need to know if you have any of these conditions:  blood disorders  hearing problems  kidney disease  recent or ongoing radiation therapy  an unusual or allergic reaction to carboplatin, cisplatin, other chemotherapy, other medicines, foods, dyes, or preservatives  pregnant or trying to get pregnant  breast-feeding How should I use this medicine? This drug is usually given as an infusion into a vein. It is administered in a hospital or clinic by a specially trained health care professional. Talk to your pediatrician regarding the use of this medicine in children. Special care may be needed. Overdosage: If you think you have taken too much of this medicine contact a poison control center or emergency room at once. NOTE: This medicine is only for you. Do not share this medicine with others. What if I miss a dose? It is important not to miss a dose. Call your doctor or health care professional if you are unable to keep an appointment. What may interact with this medicine?  medicines for seizures  medicines to increase blood counts like filgrastim, pegfilgrastim, sargramostim  some antibiotics like amikacin, gentamicin, neomycin, streptomycin,  tobramycin  vaccines Talk to your doctor or health care professional before taking any of these medicines:  acetaminophen  aspirin  ibuprofen  ketoprofen  naproxen This list may not describe all possible interactions. Give your health care provider a list of all the medicines, herbs, non-prescription drugs, or dietary supplements you use. Also tell them if you smoke, drink alcohol, or use illegal drugs. Some items may interact with your medicine. What should I watch for while using this medicine? Your condition will be monitored carefully while you are receiving this medicine. You will need important blood work done while you are taking this medicine. This drug may make you feel generally unwell. This is not uncommon, as chemotherapy can affect healthy cells as well as cancer cells. Report any side effects. Continue your course of treatment even though you feel ill unless your doctor tells you to stop. In some cases, you may be given additional medicines to help with side effects. Follow all directions for their use. Call your doctor or health care professional for advice if you get a fever, chills or sore throat, or other symptoms of a cold or flu. Do not treat yourself. This drug decreases your body's ability to fight infections. Try to avoid being around people who are sick. This medicine may increase your risk to bruise or bleed. Call your doctor or health care professional if you notice any unusual bleeding. Be careful brushing and flossing your teeth or using a toothpick because you may get an infection or bleed more easily. If you have any dental work done, tell your dentist you are receiving this medicine. Avoid taking products that contain aspirin, acetaminophen, ibuprofen, naproxen, or ketoprofen unless instructed by your doctor. These medicines may hide a fever. Do not become pregnant while taking this medicine. Women should inform their doctor if they wish to become pregnant or  think they might be pregnant. There is a potential for serious side effects to an unborn child. Talk to your health care professional or pharmacist for more information. Do not breast-feed an infant while taking this medicine. What side effects may I notice from receiving this medicine? Side effects that you should report to your doctor or health care professional as soon as possible:  allergic reactions like skin rash, itching  or hives, swelling of the face, lips, or tongue  signs of infection - fever or chills, cough, sore throat, pain or difficulty passing urine  signs of decreased platelets or bleeding - bruising, pinpoint red spots on the skin, black, tarry stools, nosebleeds  signs of decreased red blood cells - unusually weak or tired, fainting spells, lightheadedness  breathing problems  changes in hearing  changes in vision  chest pain  high blood pressure  low blood counts - This drug may decrease the number of white blood cells, red blood cells and platelets. You may be at increased risk for infections and bleeding.  nausea and vomiting  pain, swelling, redness or irritation at the injection site  pain, tingling, numbness in the hands or feet  problems with balance, talking, walking  trouble passing urine or change in the amount of urine Side effects that usually do not require medical attention (report to your doctor or health care professional if they continue or are bothersome):  hair loss  loss of appetite  metallic taste in the mouth or changes in taste This list may not describe all possible side effects. Call your doctor for medical advice about side effects. You may report side effects to FDA at 1-800-FDA-1088. Where should I keep my medicine? This drug is given in a hospital or clinic and will not be stored at home. NOTE: This sheet is a summary. It may not cover all possible information. If you have questions about this medicine, talk to your doctor,  pharmacist, or health care provider.  2021 Elsevier/Gold Standard (2007-09-21 14:38:05)

## 2020-11-09 ENCOUNTER — Encounter (HOSPITAL_COMMUNITY): Payer: Self-pay

## 2020-11-09 ENCOUNTER — Telehealth: Payer: Self-pay | Admitting: *Deleted

## 2020-11-09 NOTE — Telephone Encounter (Signed)
Called & spoke to pt's daughter who reports that pt did well with treatment & slept well, & had no n/v, diarrhea/constipation, etc.  She reports knowing how to reach [place] if needed & knows she can call with any concerns.

## 2020-11-09 NOTE — Telephone Encounter (Signed)
-----   Message from Priscille Loveless, RN sent at 11/08/2020 11:51 AM EDT ----- Regarding: First Chemo [facility] [place]; Pemetrexid; Carboplatin follow-up

## 2020-11-12 ENCOUNTER — Encounter (HOSPITAL_COMMUNITY): Payer: Self-pay | Admitting: Internal Medicine

## 2020-11-12 ENCOUNTER — Telehealth: Payer: Self-pay | Admitting: Physician Assistant

## 2020-11-12 DIAGNOSIS — C3491 Malignant neoplasm of unspecified part of right bronchus or lung: Secondary | ICD-10-CM | POA: Diagnosis not present

## 2020-11-12 NOTE — Telephone Encounter (Signed)
Scheduled appts per 5/16 sch msg. Pt's daughter is aware.  

## 2020-11-14 NOTE — Progress Notes (Signed)
Hannah [name] OFFICE PROGRESS NOTE  [name], [place] [address]  DIAGNOSIS: Stage IVnon-small cell lung cancer(T, N0, M1C)adenocarcinoma. The patient presented with a right upper lobe lung mass and solitary brain metastasis. The patient was diagnosed in February 2022.  Biomarker Findings Microsatellite status - MS-Stable Tumor Mutational Burden - 8 Muts/Mb Genomic Findings For a complete list of the genes assayed, please refer to the Appendix. KRAS G12V KEAP1 S224F TP53 P151T 7 Disease relevant genes with no reportable alterations: ALK, BRAF, EGFR, ERBB2, MET, RET, ROS1  PDL1 Expression: 90%  PRIOR THERAPY: 1)SRS to the metastatic brain lesion on 2/24/2022under the care of Dr. Lenice Llamas craniotomy under the care of Dr. Zada Finders on 08/24/2020. 2)S/p robotic assisted right upper lobectomy with en bloc wedge resection of the right middle lobe and lymph node dissection under the care of Dr. Roxan Hockey on September 24, 2020  CURRENT THERAPY: Systemic chemotherapy with carboplatin for AUC of 5, Alimta 500 Mg/M2 and Keytruda 200 mg IV every 3 weeks. First dose Nov 08, 2020. Status post 1 cycle.   INTERVAL HISTORY: Hannah [name] 70 y.o. female returns to the clinic today for a follow-up visit accompanied by her family member.  The patient is feeling fairly well today without any concerning complaints except she notes her MCP joint on her right hand is a little painful. She has a history of rheumatoid arthritis. She takes she just has been taking tylenol. She denies weakness in this area except with opening lids on jars. She denies decreased sensation or numbness/tingling. There is no erythema or swelling. No overlying skin changes. She denies falls or recent injuries to the hand. She believes this may have been present prior to treatment but may have gotten worse after her infusion. The patient underwent her first cycle of treatment last  week and tolerated it well without any concerning adverse side effects besides the joint paint. She also continues to have some stress and anxiety adjusting to her recent diagnosis. She is seeing psychiatry next week. She denies any fever, chills, night sweats, or weight loss. She denies any shortness of breath, cough, or hemoptysis. She sometimes has chest discomfort over her right ribs at the site of her prior surgery. She she denies any nausea, vomiting, diarrhea, or constipation.  She denies any rashes or skin changes. She is here for evaluation and a 1 week follow up visit to manage any adverse side effects of treatment.   MEDICAL HISTORY: Past Medical History:  Diagnosis Date  . Allergy   . Anemia   . Anxiety   . Arthritis   . Carpal tunnel syndrome   . Constipation    senna C stool softeners help   . Depression   . Diverticulosis   . Dyslipidemia   . External hemorrhoids   . GERD (gastroesophageal reflux disease)   . Heart murmur    mild-moderate AR  . Hiatal hernia   . Hyperlipidemia    on meds   . Hypertension   . Internal hemorrhoids   . Osteoarthritis   . Pre-diabetes   . PVD (peripheral vascular disease) (HCC)    moderate carotid disease  . RBBB   . Smoker   . Vocal cord polyps     ALLERGIES:  is allergic to amlodipine, chantix [varenicline tartrate], clarithromycin, lisinopril, simvastatin, wellbutrin [bupropion hcl], lipitor [atorvastatin], and sertraline.  MEDICATIONS:  Current Outpatient Medications  Medication Sig Dispense Refill  . acetaminophen (TYLENOL) 500 MG tablet  Take 2 tablets (1,000 mg total) by mouth every 6 (six) hours. 30 tablet 0  . Ascorbic Acid (VITAMIN C) 1000 MG tablet Take 1,000 mg by mouth daily.    Marland Kitchen aspirin 81 MG tablet Take 1 tablet (81 mg total) by mouth daily. Restart on 08/31/20 30 tablet   . carboxymethylcellulose (REFRESH PLUS) 0.5 % SOLN Place 1-2 drops into both eyes daily.    . clonazePAM (KLONOPIN) 0.5 MG tablet Take 1 tablet  (0.5 mg total) by mouth 2 (two) times daily as needed for anxiety. 60 tablet 1  . folic acid (FOLVITE) 1 MG tablet Take 1 tablet (1 mg total) by mouth daily. 30 tablet 4  . hydrochlorothiazide (HYDRODIURIL) 12.5 MG tablet Take 1 tablet (12.5 mg total) by mouth daily. 90 tablet 1  . ketoconazole (NIZORAL) 2 % cream Apply 1 application topically 2 (two) times daily. 15 g 2  . loratadine (CLARITIN) 10 MG tablet Take 10 mg by mouth daily as needed for allergies.    . mirtazapine (REMERON) 30 MG tablet Take 1 tablet (30 mg total) by mouth at bedtime. 30 tablet 5  . neomycin-bacitracin-polymyxin (NEOSPORIN) ointment Apply 1 application topically as needed for wound care.    Marland Kitchen omeprazole (PRILOSEC) 40 MG capsule Take 1 capsule (40 mg total) by mouth 2 (two) times daily before a meal. 180 capsule 1  . polyethylene glycol (MIRALAX / GLYCOLAX) 17 g packet Take 17 g by mouth 2 (two) times daily. 72 each 0  . pravastatin (PRAVACHOL) 40 MG tablet Take 1 tablet (40 mg total) by mouth every evening. 90 tablet 1  . Probiotic Product (PROBIOTIC DAILY PO) Take 1 capsule by mouth daily.    . prochlorperazine (COMPAZINE) 10 MG tablet Take 1 tablet (10 mg total) by mouth every 6 (six) hours as needed for nausea or vomiting. 30 tablet 0  . senna-docusate (SENOKOT-S) 8.6-50 MG tablet Take 1 tablet by mouth 2 (two) times daily. (Patient taking differently: Take 1 tablet by mouth daily as needed for mild constipation.)    . simethicone (MYLICON) 80 MG chewable tablet Chew 1 tablet (80 mg total) by mouth 4 (four) times daily as needed for flatulence (Bloating). 30 tablet 0  . traMADol (ULTRAM) 50 MG tablet Take 1-2 tablets (50-100 mg total) by mouth every 6 (six) hours as needed (mild pain). 30 tablet 0  . Wheat Dextrin (BENEFIBER) POWD Take 1 Scoop by mouth 2 (two) times daily as needed (constipation).     No current facility-administered medications for this visit.    SURGICAL HISTORY:  Past Surgical History:   Procedure Laterality Date  . ABDOMINAL HYSTERECTOMY  1994  . APPLICATION OF CRANIAL NAVIGATION N/A 08/24/2020   Procedure: APPLICATION OF CRANIAL NAVIGATION;  Surgeon: Judith Part, MD;  Location: [facility];  Service: Neurosurgery;  Laterality: N/A;  . COLONOSCOPY    . COLONOSCOPY W/ POLYPECTOMY  2009  . CRANIOTOMY Left 08/24/2020   Procedure: Left Craniotomy for Tumor Resection with Brainlab;  Surgeon: Judith Part, MD;  Location: [facility];  Service: Neurosurgery;  Laterality: Left;  . HEMORRHOID SURGERY    . INTERCOSTAL NERVE BLOCK Right 09/24/2020   Procedure: INTERCOSTAL NERVE BLOCK;  Surgeon: Melrose Nakayama, MD;  Location: [facility];  Service: Thoracic;  Laterality: Right;  . JOINT REPLACEMENT    . LYMPH NODE DISSECTION Right 09/24/2020   Procedure: LYMPH NODE DISSECTION;  Surgeon: Melrose Nakayama, MD;  Location: [facility];  Service: Thoracic;  Laterality: Right;  .  POLYPECTOMY    . right total knee arthroplasty     Dr. Emeterio Reeve 06-04-18  . SPINE SURGERY  08/16/2009  . TOTAL KNEE ARTHROPLASTY Left 02/13/2014   Procedure: TOTAL KNEE ARTHROPLASTY;  Surgeon: Alta Corning, MD;  Location: [facility];  Service: Orthopedics;  Laterality: Left;  . TOTAL KNEE ARTHROPLASTY Right 06/04/2018   Procedure: RIGHT TOTAL KNEE ARTHROPLASTY;  Surgeon: Dorna Leitz, MD;  Location: [facility];  Service: Orthopedics;  Laterality: Right;  Adductor Block  . TUBAL LIGATION      REVIEW OF SYSTEMS:   Review of Systems  Constitutional: Negative for appetite change, chills, fatigue, fever and unexpected weight change.  HENT: Negative for mouth sores, nosebleeds, sore throat and trouble swallowing.   Eyes: Negative for eye problems and icterus.  Respiratory: Negative for cough, hemoptysis, shortness of breath and wheezing.   Cardiovascular: Negative for chest pain and leg swelling.  Gastrointestinal: Negative for abdominal pain, constipation, diarrhea, nausea and vomiting.  Genitourinary: Negative for bladder  incontinence, difficulty urinating, dysuria, frequency and hematuria.   Musculoskeletal: Positive for pain in MCP in right hand. 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 Negative for back pain, gait problem, neck pain and neck stiffness.  Skin: Negative for itching and rash.  Neurological: Negative for dizziness, extremity weakness, gait problem, headaches, light-headedness and seizures.  Hematological: Negative for adenopathy. Does not bruise/bleed easily.  Psychiatric/Behavioral: Negative for confusion, depression and sleep disturbance. The patient is not nervous/anxious.    PHYSICAL EXAMINATION:  Blood pressure (!) 149/73, pulse 82, temperature 98.4 F (36.9 C), temperature source Tympanic, resp. rate 18, height 5' (1.524 m), weight 114 lb 12.8 oz (52.1 kg), SpO2 100 %.  ECOG PERFORMANCE STATUS: 1 - Symptomatic but completely ambulatory  Physical Exam  Constitutional: Oriented to person, place, and time and thin appearing female and in no distress.  HENT:  Head: Normocephalic and atraumatic.  Mouth/Throat: Oropharynx is clear and moist. No oropharyngeal exudate.  Eyes: Conjunctivae are normal. Right eye exhibits no discharge. Left eye exhibits no discharge. No scleral icterus.  Neck: Normal range of motion. Neck supple.  Cardiovascular: Normal rate, regular rhythm, mild systolic murmur noted and intact distal pulses.   Pulmonary/Chest: Effort normal and breath sounds normal. No respiratory distress. No wheezes. No rales.  Abdominal: Soft. Bowel sounds are normal. Exhibits no distension and no mass. There is no tenderness.  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 No weakness in hands. No decreased sensation. No swelling or erythema. Some tenderness to palpation over MCP. Normal range of motion. Exhibits no edema.  Lymphadenopathy:    No cervical adenopathy.  Neurological: Alert and oriented to person, place, and time. Exhibits normal muscle tone. Gait normal. Coordination normal.  Skin:  Skin is warm and dry. No rash noted. Not diaphoretic. No erythema. No pallor.  Psychiatric: Mood, memory and judgment normal.  Vitals reviewed  LABORATORY DATA: Lab Results  Component Value Date   WBC 3.4 (L) 11/15/2020   HGB 11.1 (L) 11/15/2020   HCT 35.6 (L) 11/15/2020   MCV 84.8 11/15/2020   PLT 191 11/15/2020      Chemistry      Component Value Date/Time   NA 142 11/15/2020 1041   K 3.9 11/15/2020 1041   CL 102 11/15/2020 1041   CO2 30 11/15/2020 1041   BUN 13 11/15/2020 1041   CREATININE 0.74 11/15/2020 1041   CREATININE 0.79 03/14/2020 1016      Component Value Date/Time   CALCIUM 9.9 11/15/2020 1041   ALKPHOS  81 11/15/2020 1041   AST 20 11/15/2020 1041   ALT 16 11/15/2020 1041   BILITOT 0.3 11/15/2020 1041       RADIOGRAPHIC STUDIES:  No results found.   ASSESSMENT/PLAN:  This is a very pleasant 70 year old African-American female diagnosed with stage IV (T3, N0, M1 B) non-small cell lung cancer, adenocarcinoma.  She presented with a right upper lobe lung mass with a solitary brain metastasis.  She was diagnosed in February 2022.  Her PD-L1 expression is 90%.  She does not have any actionable mutations.  The patient completed SRS followed by craniotomy and resection on 2/24/2022under the care of Dr. Lenice Llamas craniotomy under the care of Dr. Zada Finders on 08/24/2020.  She then had a robot-assisted right upper lobectomy with en bloc wedge resection of the right middle lobe and lymph node dissection under the care of Dr. Roxan Hockey on September 24, 2020.  The patient is currently undergoing systemic chemotherapy with 4-6 cycles of carboplatin for an AUC of 5, Alimta 500 mg per metered squared, Keytruda 200 mg IV every 3 weeks.  She is status post 1 cycle and tolerated it well without any adverse side effects.   Labs were reviewed. Recommend that she continue on the same treatment at the same dose. Her right MCP joint pain may be secondary to her immunotherapy use  with Keytruda. She is able to manage her symptoms for now with tylenol; however, if she develops significant joint pains in the future, we may have to discuss holding her immunotherapy.   We will see her back for a follow up visit in 2 weeks for evaluation before starting cycle #2.  She takes klonopin for anxiety as prescribed by her PCP. She will meet with her psychiatrist later this month for anxiety.    I will arrange for the COVID-19 booster vaccine next week after her labs as requested. She also was told that she can receive her shingles shot if needed through her PCP.   She is scheduled for a repeat brain MRI tomorrow and a follow up with neuro-oncology next week.   The patient was advised to call immediately if she has any concerning symptoms in the interval. The patient voices understanding of current disease status and treatment options and is in agreement with the current care plan. All questions were answered. The patient knows to call the clinic with any problems, questions or concerns. We can certainly see the patient much sooner if necessary   No orders of the defined types were placed in this encounter.    Maple Odaniel L Jazelle Achey, PA-C 11/15/20  ADDENDUM: Hematology/Oncology Attending: I had a face-to-face encounter with the patient.  I reviewed her records and recommended her care plan.  This is a very pleasant 70 years old African-American female with metastatic non-small cell lung cancer, adenocarcinoma with no actionable mutation and PD-L1 expression of 90%.  The patient is status post resection of the solitary brain metastasis as well as surgical resection of the right upper lobe with en bloc wedge resection of the right middle lobe and lymph node dissection under the care of Dr. Roxan Hockey in March 2022. The patient is currently undergoing adjuvant treatment with systemic chemotherapy with carboplatin for AUC of 5, Alimta 500 Mg/M2 and Keytruda 200 Mg IV every 3 weeks  status post 1 cycle started last week.  She tolerated the first week of her treatment well with no concerning adverse effects. I recommended for the patient to continue her treatment with chemotherapy as planned  and she is expected to come back for follow-up visit in 2 weeks for evaluation before starting cycle #2. Regarding the brain metastasis she is scheduled to have MRI of the brain tomorrow as well as follow-up by Dr. Mickeal Skinner. The patient was advised to call immediately if she has any concerning symptoms in the interval.  Disclaimer: This note was dictated with voice recognition software. Similar sounding words can inadvertently be transcribed and may be missed upon review. Eilleen Kempf, MD 11/16/20

## 2020-11-15 ENCOUNTER — Other Ambulatory Visit: Payer: Self-pay

## 2020-11-15 ENCOUNTER — Telehealth: Payer: Self-pay | Admitting: Internal Medicine

## 2020-11-15 ENCOUNTER — Inpatient Hospital Stay: Payer: Medicare HMO

## 2020-11-15 ENCOUNTER — Inpatient Hospital Stay: Payer: Medicare HMO | Admitting: Physician Assistant

## 2020-11-15 ENCOUNTER — Encounter: Payer: Self-pay | Admitting: Physician Assistant

## 2020-11-15 VITALS — BP 149/73 | HR 82 | Temp 98.4°F | Resp 18 | Ht 60.0 in | Wt 114.8 lb

## 2020-11-15 DIAGNOSIS — I10 Essential (primary) hypertension: Secondary | ICD-10-CM | POA: Diagnosis not present

## 2020-11-15 DIAGNOSIS — E785 Hyperlipidemia, unspecified: Secondary | ICD-10-CM | POA: Diagnosis not present

## 2020-11-15 DIAGNOSIS — F419 Anxiety disorder, unspecified: Secondary | ICD-10-CM | POA: Diagnosis not present

## 2020-11-15 DIAGNOSIS — Z5111 Encounter for antineoplastic chemotherapy: Secondary | ICD-10-CM | POA: Diagnosis not present

## 2020-11-15 DIAGNOSIS — E119 Type 2 diabetes mellitus without complications: Secondary | ICD-10-CM | POA: Diagnosis not present

## 2020-11-15 DIAGNOSIS — C3411 Malignant neoplasm of upper lobe, right bronchus or lung: Secondary | ICD-10-CM

## 2020-11-15 DIAGNOSIS — R635 Abnormal weight gain: Secondary | ICD-10-CM | POA: Diagnosis not present

## 2020-11-15 DIAGNOSIS — Z5112 Encounter for antineoplastic immunotherapy: Secondary | ICD-10-CM | POA: Diagnosis not present

## 2020-11-15 DIAGNOSIS — C7931 Secondary malignant neoplasm of brain: Secondary | ICD-10-CM | POA: Diagnosis not present

## 2020-11-15 LAB — CMP (CANCER CENTER ONLY)
ALT: 16 U/L (ref 0–44)
AST: 20 U/L (ref 15–41)
Albumin: 3.7 g/dL (ref 3.5–5.0)
Alkaline Phosphatase: 81 U/L (ref 38–126)
Anion gap: 10 (ref 5–15)
BUN: 13 mg/dL (ref 8–23)
CO2: 30 mmol/L (ref 22–32)
Calcium: 9.9 mg/dL (ref 8.9–10.3)
Chloride: 102 mmol/L (ref 98–111)
Creatinine: 0.74 mg/dL (ref 0.44–1.00)
GFR, Estimated: >60 mL/min (ref >60)
Glucose, Bld: 107 mg/dL — ABNORMAL HIGH (ref 70–99)
Potassium: 3.9 mmol/L (ref 3.5–5.1)
Sodium: 142 mmol/L (ref 135–145)
Total Bilirubin: 0.3 mg/dL (ref 0.3–1.2)
Total Protein: 7.2 g/dL (ref 6.5–8.1)

## 2020-11-15 LAB — CBC WITH DIFFERENTIAL (CANCER CENTER ONLY)
Abs Immature Granulocytes: 0 10*3/uL (ref 0.00–0.07)
Basophils Absolute: 0 10*3/uL (ref 0.0–0.1)
Basophils Relative: 1 %
Eosinophils Absolute: 0.2 10*3/uL (ref 0.0–0.5)
Eosinophils Relative: 5 %
HCT: 35.6 % — ABNORMAL LOW (ref 36.0–46.0)
Hemoglobin: 11.1 g/dL — ABNORMAL LOW (ref 12.0–15.0)
Immature Granulocytes: 0 %
Lymphocytes Relative: 41 %
Lymphs Abs: 1.4 10*3/uL (ref 0.7–4.0)
MCH: 26.4 pg (ref 26.0–34.0)
MCHC: 31.2 g/dL (ref 30.0–36.0)
MCV: 84.8 fL (ref 80.0–100.0)
Monocytes Absolute: 0.2 10*3/uL (ref 0.1–1.0)
Monocytes Relative: 7 %
Neutro Abs: 1.6 10*3/uL — ABNORMAL LOW (ref 1.7–7.7)
Neutrophils Relative %: 46 %
Platelet Count: 191 10*3/uL (ref 150–400)
RBC: 4.2 MIL/uL (ref 3.87–5.11)
RDW: 13.6 % (ref 11.5–15.5)
WBC Count: 3.4 10*3/uL — ABNORMAL LOW (ref 4.0–10.5)
nRBC: 0 % (ref 0.0–0.2)

## 2020-11-15 LAB — TSH: TSH: 0.659 u[IU]/mL (ref 0.308–3.960)

## 2020-11-15 NOTE — Telephone Encounter (Signed)
Patients daughter has called in regards to getting the patient set up for a shingles shot.   Never had one before. Insurance- [employer]   Please advise  Terisa Starr (daughter)- [phone]

## 2020-11-16 ENCOUNTER — Ambulatory Visit
Admission: RE | Admit: 2020-11-16 | Discharge: 2020-11-16 | Disposition: A | Discharge status: Home / Self Care | Payer: Medicare HMO | Source: Ambulatory Visit | Attending: Internal Medicine | Admitting: Internal Medicine

## 2020-11-16 DIAGNOSIS — C3490 Malignant neoplasm of unspecified part of unspecified bronchus or lung: Secondary | ICD-10-CM | POA: Diagnosis not present

## 2020-11-16 DIAGNOSIS — C7931 Secondary malignant neoplasm of brain: Secondary | ICD-10-CM

## 2020-11-16 DIAGNOSIS — R220 Localized swelling, mass and lump, head: Secondary | ICD-10-CM | POA: Diagnosis not present

## 2020-11-16 MED ORDER — GADOBENATE DIMEGLUMINE 529 MG/ML IV SOLN
10.0000 mL | Freq: Once | INTRAVENOUS | Status: AC | PRN
  Administered 2020-11-16: 10 mL via INTRAVENOUS

## 2020-11-16 NOTE — Telephone Encounter (Signed)
Spoke with daughter today.

## 2020-11-19 ENCOUNTER — Inpatient Hospital Stay: Payer: Medicare HMO

## 2020-11-20 ENCOUNTER — Other Ambulatory Visit: Payer: Self-pay | Admitting: Thoracic Surgery (Cardiothoracic Vascular Surgery)

## 2020-11-20 ENCOUNTER — Encounter: Payer: Self-pay | Admitting: *Deleted

## 2020-11-20 ENCOUNTER — Telehealth: Payer: Self-pay | Admitting: *Deleted

## 2020-11-20 ENCOUNTER — Ambulatory Visit
Admission: RE | Admit: 2020-11-20 | Discharge: 2020-11-20 | Disposition: A | Discharge status: Home / Self Care | Payer: Medicare HMO | Source: Ambulatory Visit | Attending: Urology | Admitting: Urology

## 2020-11-20 ENCOUNTER — Ambulatory Visit: Payer: Medicare HMO | Admitting: *Deleted

## 2020-11-20 ENCOUNTER — Other Ambulatory Visit: Payer: Self-pay

## 2020-11-20 ENCOUNTER — Ambulatory Visit
Admission: RE | Admit: 2020-11-20 | Discharge: 2020-11-20 | Disposition: A | Discharge status: Home / Self Care | Payer: Medicare HMO | Source: Ambulatory Visit | Attending: Thoracic Surgery (Cardiothoracic Vascular Surgery) | Admitting: Thoracic Surgery (Cardiothoracic Vascular Surgery)

## 2020-11-20 ENCOUNTER — Ambulatory Visit (INDEPENDENT_AMBULATORY_CARE_PROVIDER_SITE_OTHER): Payer: Self-pay | Admitting: Thoracic Surgery (Cardiothoracic Vascular Surgery)

## 2020-11-20 ENCOUNTER — Encounter: Payer: Self-pay | Admitting: Internal Medicine

## 2020-11-20 VITALS — BP 128/69 | HR 82 | Resp 20 | Ht 60.0 in | Wt 117.0 lb

## 2020-11-20 DIAGNOSIS — R918 Other nonspecific abnormal finding of lung field: Secondary | ICD-10-CM

## 2020-11-20 DIAGNOSIS — I10 Essential (primary) hypertension: Secondary | ICD-10-CM | POA: Diagnosis not present

## 2020-11-20 DIAGNOSIS — Z85118 Personal history of other malignant neoplasm of bronchus and lung: Secondary | ICD-10-CM | POA: Diagnosis not present

## 2020-11-20 DIAGNOSIS — C7931 Secondary malignant neoplasm of brain: Secondary | ICD-10-CM

## 2020-11-20 DIAGNOSIS — Z9889 Other specified postprocedural states: Secondary | ICD-10-CM | POA: Diagnosis not present

## 2020-11-20 DIAGNOSIS — J986 Disorders of diaphragm: Secondary | ICD-10-CM | POA: Diagnosis not present

## 2020-11-20 DIAGNOSIS — C3411 Malignant neoplasm of upper lobe, right bronchus or lung: Secondary | ICD-10-CM | POA: Diagnosis not present

## 2020-11-20 NOTE — Progress Notes (Signed)
I received a call from Ms. [place] daughter. Per her request I called [facility] and updated on her needs. I then called her back to update.

## 2020-11-20 NOTE — Progress Notes (Signed)
[address]annah [address]       [name] [address]             [phone]     HPI: Hannah [name] presents for a scheduled follow-up visit after having a right upper lobectomy  Hannah [name] is a 70 year old woman with history of tobacco abuse, right bundle branch block, aortic insufficiency, hypertension, hyperlipidemia, reflux, anemia, arthritis, glucose intolerance, anxiety, depression, and a T3, N0, M1b lung cancer.  She initially presented with expressive aphasia and right arm weakness.  She was found to have a brain metastasis.  I was treated with stereotactic radiation followed by resection.  Further work-up revealed a 4.8 cm right upper lobe mass.  There was no evidence of other metastatic disease.  She had a right upper lobectomy on 09/24/2020.  I last saw her on 10/09/2020.  She was having some incisional discomfort.  She also was having some issues with mood swings and some depression.  She continues to have pain that is mostly related to eating at this point.  She does experience pain in the right subcostal region when she eats.  She cannot related to any particular for food items.  She says it happens about once a week.  Past Medical History:  Diagnosis Date  . Allergy   . Anemia   . Anxiety   . Arthritis   . Carpal tunnel syndrome   . Constipation    senna C stool softeners help   . Depression   . Diverticulosis   . Dyslipidemia   . External hemorrhoids   . GERD (gastroesophageal reflux disease)   . Heart murmur    mild-moderate AR  . Hiatal hernia   . Hyperlipidemia    on meds   . Hypertension   . Internal hemorrhoids   . Osteoarthritis   . Pre-diabetes   . PVD (peripheral vascular disease) (HCC)    moderate carotid disease  . RBBB   . Smoker   . Vocal cord polyps     Current Outpatient Medications  Medication Sig Dispense Refill  . acetaminophen (TYLENOL) 500 MG tablet Take 2 tablets (1,000 mg total) by mouth every 6 (six) hours. 30 tablet 0   . Ascorbic Acid (VITAMIN C) 1000 MG tablet Take 1,000 mg by mouth daily.    Marland Kitchen aspirin 81 MG tablet Take 1 tablet (81 mg total) by mouth daily. Restart on 08/31/20 30 tablet   . carboxymethylcellulose (REFRESH PLUS) 0.5 % SOLN Place 1-2 drops into both eyes daily.    . clonazePAM (KLONOPIN) 0.5 MG tablet Take 1 tablet (0.5 mg total) by mouth 2 (two) times daily as needed for anxiety. 60 tablet 1  . folic acid (FOLVITE) 1 MG tablet Take 1 tablet (1 mg total) by mouth daily. 30 tablet 4  . hydrochlorothiazide (HYDRODIURIL) 12.5 MG tablet Take 1 tablet (12.5 mg total) by mouth daily. 90 tablet 1  . ketoconazole (NIZORAL) 2 % cream Apply 1 application topically 2 (two) times daily. 15 g 2  . loratadine (CLARITIN) 10 MG tablet Take 10 mg by mouth daily as needed for allergies.    . mirtazapine (REMERON) 30 MG tablet Take 1 tablet (30 mg total) by mouth at bedtime. 30 tablet 5  . neomycin-bacitracin-polymyxin (NEOSPORIN) ointment Apply 1 application topically as needed for wound care.    Marland Kitchen omeprazole (PRILOSEC) 40 MG capsule Take 1 capsule (40 mg total) by mouth 2 (two) times daily before a meal. 180 capsule 1  .  polyethylene glycol (MIRALAX / GLYCOLAX) 17 g packet Take 17 g by mouth 2 (two) times daily. 72 each 0  . pravastatin (PRAVACHOL) 40 MG tablet Take 1 tablet (40 mg total) by mouth every evening. 90 tablet 1  . Probiotic Product (PROBIOTIC DAILY PO) Take 1 capsule by mouth daily.    . prochlorperazine (COMPAZINE) 10 MG tablet Take 1 tablet (10 mg total) by mouth every 6 (six) hours as needed for nausea or vomiting. 30 tablet 0  . senna-docusate (SENOKOT-S) 8.6-50 MG tablet Take 1 tablet by mouth 2 (two) times daily. (Patient taking differently: Take 1 tablet by mouth daily as needed for mild constipation.)    . simethicone (MYLICON) 80 MG chewable tablet Chew 1 tablet (80 mg total) by mouth 4 (four) times daily as needed for flatulence (Bloating). 30 tablet 0  . traMADol (ULTRAM) 50 MG tablet Take  1-2 tablets (50-100 mg total) by mouth every 6 (six) hours as needed (mild pain). 30 tablet 0  . Wheat Dextrin (BENEFIBER) POWD Take 1 Scoop by mouth 2 (two) times daily as needed (constipation).     No current facility-administered medications for this visit.    Physical Exam BP 128/69   Pulse 82   Resp 20   Ht 5' (1.524 m)   Wt 117 lb (53.1 kg)   SpO2 99% Comment: RA  BMI 22.17 kg/m  70 year old woman in no acute distress Alert and oriented x3 with no focal deficits Lungs diminished right base but otherwise clear Cardiac regular rate and rhythm Incisions well-healed  Diagnostic Tests: I personally reviewed her chest x-ray.  Shows postoperative changes.  Impression: Hannah [name] is a 70 year old woman who had a right upper lobectomy with a history of tobacco abuse, right bundle branch block, aortic insufficiency, hypertension, hyperlipidemia, reflux, anemia, arthritis, glucose intolerance, anxiety, depression, and stage IV lung cancer.   She initially presented with neurologic symptoms.  She was found to have a brain metastasis.  She also was found to have a lung mass.  Work-up showed no other signs of disease so she was offered the option of surgical resection.  She had a right upper lobectomy about 2 months ago.  She now is on chemotherapy.  Overall she is doing well.  I am not sure exactly the mechanism of her pain that is associated with the eating.  It only happens occasionally.  She is not able to related to any certain type of foods.  She did have some gallstones on her CT from February so possibly is related to that.  If the pain were to become more frequent or severe she should contact her primary or a gastroenterologist.  From a surgical standpoint she is doing well.  Plan: Follow-up as scheduled with Hannah [name] Return in 6 months with PA lateral chest x-ray  Hannah Nakayama, MD [facility] [phone]

## 2020-11-20 NOTE — Progress Notes (Signed)
Radiation Oncology         [phone] ________________________________  Name: Hannah [name] MRN: [id number]  Date: 11/20/2020  DOB: 31-Oct-1950  Post Treatment Note  CC: Hannah Rail, MD  Hannah Rail, MD  Diagnosis:   70 yo woman with brain metastasis from Stage IVnon-small cell lung cancer(T3, N0, M1C)adenocarcinoma of the right upper lung.   Interval Since Last Radiation:  3 months 08/23/20//pre-op SRS:  The Left frontal 2.5 cm target was treated to a prescription dose of 18 Gy in a single fraction (Pre-op).   Narrative:  I spoke with the patient/her daughter, Hannah [name], to conduct her routine scheduled 3 month follow up visit via telephone to spare the patient unnecessary potential exposure in the healthcare setting during the current COVID-19 pandemic.   She tolerated the pre-op SRS treatment very well without any ill side effects and is currently without complaints.  Today's visit is to review results of her recent post-treatment MRI brain from 11/16/20.  In brief summary, she initially presented to her PCP in late Jan. 2022 with complaints of several days of rapidly progressive speech impairment and right sided weakness. She and her family described difficulty putting complete sentences together, impaired use of the right arm, and dragging of the right leg while walking.  Her PCP, Dr. Quay [name] ordered imaging, initially with a noncontrast brain MRI performed on 07/31/2020 which showed a 1.5 cm left frontal lobe lesion with prominent surrounding edema and mass-effect with a 3 mm rightward midline shift.  Brain MRI with contrast was performed for further evaluation on 08/02/20 and demonstrated a lobulated left frontal lesion with necrotic center and irregular peripheral contrast enhancement measuring approximately 2.2 x 1.8 x 1.4 cm, with surrounding edema and 3 to 4 mm rightward midline shift.  No additional brain lesions were noted.  She was started on Decadron at 89m twice per day and  did notice improvement with regards to the right sided weakness and speech. She met with Dr. VMickeal Skinneron 08/06/20 and the Decadron dose was lowered to 410mdaily, starting on 08/08/20, which she tolerated well.  A CT C/A/P was obtained on 08/02/20 to assess for a potential primary source of the apparent brain metastasis since she had no prior history of cancer to her knowledge.  The chest imaging revealed an irregular, solid 5.4 x 4.4 cm peripheral right upper lobe lung mass, consistent with bronchogenic carcinoma.  There were no enlarged mediastinal nodes or other evidence of metastatic cancer in the chest, abdomen or pelvis.  A PET scan on 08/08/20 confirmed the hypermetabolic mass in the right upper lobe but no other sites of active disease.     We met with the patient and her daughter, ReLidia Collumon 08/09/20 to discuss radiation options for management of the brain disease. Her case was discussed in multidisciplinary brain conference and the consensus recommendation was to proceed with pre-op SRS followed by surgical resection which she was in agreement with. She had a single fraction of SRS to the solitary met in the left frontal lobe on 08/23/20 which was tolerated well and then proceeded to surgical resection, under the care of Dr. OsZada Findersn 08/24/20.  She did well with surgery and was able to go home the following day. Final surgical pathology showed a poorly differentiated carcinoma consistent with a lung primary.  She met with Dr. HeRoxan Hockeyn 09/04/20 to discuss robotic assisted thorascopy-right upper lobectomy with en bloc wedge resection of the right middle lobe and node resection  which was performed on 09/24/20. 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  She met back with Dr. Julien [name] on 10/23/20 and the recommendation was to proceed with adjuvant systemic chemotherapy with immunotherapy which she  recently started 11/07/20 with carboplatin, Alimta and Keytruda 200 mg IV every 3 weeks and so far is tolerating well.        She had a recent follow up/surveillance MRI brain on 11/16/20 which unfortunately shows two new subcentimeter metastases in the bilateral cerebrum with a 46mm right low parietal lesion and a 4 mm left parietal lesion. Evolving left frontal treatment site without findings worrisome for residual disease.  These images were reviewed in our recent multidisciplinary brain conference on 11/19/20 and consensus recommendation is to proceed with a single fraction of SRS to treat the two new lesions. We have reviewed these results and recommendations with the patient's daughter, Hannah [name] today.  On review of systems, the patient states that she is doing well in general. She is struggling with increased anxiety/depression related to her new diagnosis and she is having occasional, intermittent headaches. The headaches are mild and resolve with Tylenol/Advil prn. She denies any changes in visual or auditory acuity, progressive focal weakness, paraesthesias, difficulty with speech, tremor or seizure activity. She was scheduled to see psychiatry this week but unfortunately, the office called to reschedule due to provider conflict. She denies any fever, chills, night sweats, or weight loss.She denies any shortness of breath, cough, or hemoptysis. She sometimes has chest discomfort over her right ribs at the site of her prior surgery but this is improving. She she denies any nausea, vomiting, diarrhea, or constipation.  ALLERGIES:  is allergic to amlodipine, chantix [varenicline tartrate], clarithromycin, lisinopril, simvastatin, wellbutrin [bupropion hcl], lipitor [atorvastatin], and sertraline.  Meds: Current Outpatient Medications  Medication Sig Dispense Refill  . acetaminophen (TYLENOL) 500 MG tablet Take 2 tablets (1,000 mg total) by mouth every 6 (six) hours. 30 tablet 0  . Ascorbic Acid  (VITAMIN C) 1000 MG tablet Take 1,000 mg by mouth daily.    Marland Kitchen aspirin 81 MG tablet Take 1 tablet (81 mg total) by mouth daily. Restart on 08/31/20 30 tablet   . carboxymethylcellulose (REFRESH PLUS) 0.5 % SOLN Place 1-2 drops into both eyes daily.    . clonazePAM (KLONOPIN) 0.5 MG tablet Take 1 tablet (0.5 mg total) by mouth 2 (two) times daily as needed for anxiety. 60 tablet 1  . folic acid (FOLVITE) 1 MG tablet Take 1 tablet (1 mg total) by mouth daily. 30 tablet 4  . hydrochlorothiazide (HYDRODIURIL) 12.5 MG tablet Take 1 tablet (12.5 mg total) by mouth daily. 90 tablet 1  . ketoconazole (NIZORAL) 2 % cream Apply 1 application topically 2 (two) times daily. 15 g 2  . loratadine (CLARITIN) 10 MG tablet Take 10 mg by mouth daily as needed for allergies.    . mirtazapine (REMERON) 30 MG tablet Take 1 tablet (30 mg total) by mouth at bedtime. 30 tablet 5  . neomycin-bacitracin-polymyxin (NEOSPORIN) ointment Apply 1 application topically as needed for wound care.    Marland Kitchen omeprazole (PRILOSEC) 40 MG capsule Take 1 capsule (40 mg total) by mouth 2 (two) times daily before a meal. 180 capsule 1  . polyethylene glycol (MIRALAX / GLYCOLAX) 17 g packet Take 17 g by mouth 2 (two) times daily. 72 each 0  . pravastatin (PRAVACHOL) 40 MG tablet Take 1  tablet (40 mg total) by mouth every evening. 90 tablet 1  . Probiotic Product (PROBIOTIC DAILY PO) Take 1 capsule by mouth daily.    . prochlorperazine (COMPAZINE) 10 MG tablet Take 1 tablet (10 mg total) by mouth every 6 (six) hours as needed for nausea or vomiting. 30 tablet 0  . senna-docusate (SENOKOT-S) 8.6-50 MG tablet Take 1 tablet by mouth 2 (two) times daily. (Patient taking differently: Take 1 tablet by mouth daily as needed for mild constipation.)    . simethicone (MYLICON) 80 MG chewable tablet Chew 1 tablet (80 mg total) by mouth 4 (four) times daily as needed for flatulence (Bloating). 30 tablet 0  . traMADol (ULTRAM) 50 MG tablet Take 1-2 tablets  (50-100 mg total) by mouth every 6 (six) hours as needed (mild pain). 30 tablet 0  . Wheat Dextrin (BENEFIBER) POWD Take 1 Scoop by mouth 2 (two) times daily as needed (constipation).     No current facility-administered medications for this encounter.    Physical Findings:  vitals were not taken for this visit.   /Unable to assess due to telephone follow up visit format.  Lab Findings: Lab Results  Component Value Date   WBC 3.4 (L) 11/15/2020   HGB 11.1 (L) 11/15/2020   HCT 35.6 (L) 11/15/2020   MCV 84.8 11/15/2020   PLT 191 11/15/2020     Radiographic Findings: MR BRAIN W WO CONTRAST  Result Date: 11/17/2020 CLINICAL DATA:  Assess treatment response. Follow-up SRS. Solitary metastasis with resection. Lung cancer, non-small cell. EXAM: MRI HEAD WITHOUT AND WITH CONTRAST TECHNIQUE: Multiplanar, multiecho pulse sequences of the brain and surrounding structures were obtained without and with intravenous contrast. CONTRAST:  15mL MULTIHANCE GADOBENATE DIMEGLUMINE 529 MG/ML IV SOLN COMPARISON:  08/25/2020 FINDINGS: BRAIN New Lesions: 1. Ring-enhancing cortically based mass in the low right parietal region measuring 9 mm on 11:80 2. 4 mm nodule in the left parietal cortex measured on 11:93. Larger lesions: None. Stable or Smaller lesions: The high left frontal resected metastasis shows regression of the hematoma cavity. No worrisome enhancement about this treatment site. Other Brain findings: Decreased edema associated with the left frontal treatment site. Mild edema is associated with the right more than left new cerebral metastases. No incidental infarct, acute hemorrhage, hydrocephalus, or collection. Vascular: Preserved flow voids and vascular enhancements Skull and upper cervical spine: Unremarkable craniotomy. No new or worrisome marrow enhancement. Sinuses/Orbits: Negative IMPRESSION: 1. Two new subcentimeter metastases in the bilateral cerebrum. 2. Evolving left frontal treatment site  without findings worrisome for residual. Electronically Signed   By: Monte Fantasia M.D.   On: 11/17/2020 15:46    Impression/Plan: 41.  70 yo woman with brain metastasis from Stage IVnon-small cell lung cancer(T3, N0, M1C)adenocarcinoma of the right upper lung.  She appears to have recovered well from the effects of her recent [facility] and is currently without complaints. She has recently started combination chemo-immunotherapy with carboplatin, Alimta and Keytruda 200 mg IV every 3 weeks and so far is tolerating well. We reviewed the results of her recent follow up/surveillance MRI brain from 11/16/20 which unfortunately shows two new subcentimeter metastases in the bilateral cerebrum with a 28mm right low parietal lesion and a 4 mm left parietal lesion.   We discussed the consensus recommendation from multidisciplinary brain tumor board to proceed with a single fraction of SRS to treat the two new lesions. We discussed the available radiation techniques, and focused on the details and logistics of delivery. We reviewed the anticipated  acute and late sequelae associated with radiation in this setting. The patient and her daughter, Hannah [name] were encouraged to ask questions that were answered to their stated satisfaction.  At the conclusion of our conversation, they are in agreement to proceed with the recommended single fraction course of SRS to treat the two new metastases in the right and left parietal lobes. She has a scheduled follow up with Dr. Mickeal [name] on 11/22/20 so we have schedule CT SIM to follow that afternoon at 2:30pm. We will share our discussion with her team and proceed with treatment planning accordingly, in anticipation of proceeding with treatment in the near future. She and her daughter know that they are welcome to call at any time in the interim with questions or concerns related to radiotherapy.      Nicholos Johns, PA-C

## 2020-11-20 NOTE — Telephone Encounter (Signed)
I received another vm message from Ms. Laclede daughter.  She had questions about her mother's appt with rad onc. I called her back but was unable to reach. I did leave vm message with the number to rad onc for clarification.

## 2020-11-20 NOTE — Chronic Care Management (AMB) (Signed)
Chronic Care Management   CCM RN Visit Note  11/20/2020 Name: Hannah [name] MRN: [id number] DOB: [birth year]  Subjective: Hannah [name] is a 70 y.o. year old female who is a primary care patient of Burns, Claudina Lick, MD. The care management team was consulted for assistance with disease management and care coordination needs.    Engaged with patient's caregiver/ daughter Hannah [name], on [place], by telephone for follow up (acute) visit in response to provider referral for case management and/or care coordination services.   Consent to Services:  The patient was given information about Chronic Care Management services, agreed to services, and gave verbal consent prior to initiation of services.  Please see initial visit note for detailed documentation.  Patient agreed to services and verbal consent obtained.   Assessment: Review of patient past medical history, allergies, medications, health status, including review of consultants reports, laboratory and other test data, was performed as part of comprehensive evaluation and provision of chronic care management services.   CCM Care Plan  Allergies  Allergen Reactions  . Amlodipine Swelling and Rash    Rash, swelling  . Chantix [Varenicline Tartrate] Shortness Of Breath, Swelling and Other (See Comments)    Tongue swell,sob  . Clarithromycin Rash  . Lisinopril Hives  . Simvastatin Hives  . Wellbutrin [Bupropion Hcl] Hives  . Lipitor [Atorvastatin]     Dizziness per patient  . Sertraline     Makes her feel like she is going to kill someone    Outpatient Encounter Medications as of 11/20/2020  Medication Sig Note  . acetaminophen (TYLENOL) 500 MG tablet Take 2 tablets (1,000 mg total) by mouth every 6 (six) hours.   . Ascorbic Acid (VITAMIN C) 1000 MG tablet Take 1,000 mg by mouth daily.   Marland Kitchen aspirin 81 MG tablet Take 1 tablet (81 mg total) by mouth daily. Restart on 08/31/20   . carboxymethylcellulose (REFRESH PLUS) 0.5 % SOLN Place 1-2  drops into both eyes daily.   . clonazePAM (KLONOPIN) 0.5 MG tablet Take 1 tablet (0.5 mg total) by mouth 2 (two) times daily as needed for anxiety.   . folic acid (FOLVITE) 1 MG tablet Take 1 tablet (1 mg total) by mouth daily.   . hydrochlorothiazide (HYDRODIURIL) 12.5 MG tablet Take 1 tablet (12.5 mg total) by mouth daily.   Marland Kitchen ketoconazole (NIZORAL) 2 % cream Apply 1 application topically 2 (two) times daily.   Marland Kitchen loratadine (CLARITIN) 10 MG tablet Take 10 mg by mouth daily as needed for allergies.   . mirtazapine (REMERON) 30 MG tablet Take 1 tablet (30 mg total) by mouth at bedtime.   Marland Kitchen neomycin-bacitracin-polymyxin (NEOSPORIN) ointment Apply 1 application topically as needed for wound care.   Marland Kitchen omeprazole (PRILOSEC) 40 MG capsule Take 1 capsule (40 mg total) by mouth 2 (two) times daily before a meal.   . polyethylene glycol (MIRALAX / GLYCOLAX) 17 g packet Take 17 g by mouth 2 (two) times daily.   . pravastatin (PRAVACHOL) 40 MG tablet Take 1 tablet (40 mg total) by mouth every evening.   . Probiotic Product (PROBIOTIC DAILY PO) Take 1 capsule by mouth daily.   . prochlorperazine (COMPAZINE) 10 MG tablet Take 1 tablet (10 mg total) by mouth every 6 (six) hours as needed for nausea or vomiting. 10/30/2020: 10/30/20: reports has not needed recently  . senna-docusate (SENOKOT-S) 8.6-50 MG tablet Take 1 tablet by mouth 2 (two) times daily. (Patient taking differently: Take 1 tablet by mouth daily as  needed for mild constipation.)   . simethicone (MYLICON) 80 MG chewable tablet Chew 1 tablet (80 mg total) by mouth 4 (four) times daily as needed for flatulence (Bloating).   . traMADol (ULTRAM) 50 MG tablet Take 1-2 tablets (50-100 mg total) by mouth every 6 (six) hours as needed (mild pain).   . Wheat Dextrin (BENEFIBER) POWD Take 1 Scoop by mouth 2 (two) times daily as needed (constipation). 10/30/2020: 10/30/20: patient reports has not needed recently    No facility-administered encounter medications on  file as of 11/20/2020.    Patient Active Problem List   Diagnosis Date Noted  . Encounter for antineoplastic chemotherapy 10/23/2020  . Encounter for antineoplastic immunotherapy 10/23/2020  . Anemia 10/19/2020  . Sleep difficulties 10/19/2020  . S/P robot-assisted surgical procedure 09/24/2020  . Aortic atherosclerosis ([place]) 09/12/2020  . Lung mass 09/04/2020  . Status post craniotomy 08/24/2020  . Brain tumor ([place]) 08/24/2020  . Hematochezia 08/19/2020  . Acute blood loss anemia   . Rectal bleeding 08/17/2020  . Primary cancer of right upper lobe of lung ([place]) 08/07/2020  . Brain metastasis ([place]) 08/06/2020  . Memory changes 07/24/2020  . Non-recurrent unilateral inguinal hernia without obstruction or gangrene 07/24/2020  . Pain of right clavicle 09/12/2019  . Nonspecific abnormal electrocardiogram (ECG) (EKG) 10/20/2018  . Educated about COVID-19 virus infection 10/20/2018  . Weight loss 07/17/2018  . Depression 07/17/2018  . Primary osteoarthritis of right knee 06/04/2018  . Bilateral carotid artery stenosis 09/24/2017  . Aortic valve sclerosis 09/24/2017  . Vocal cord polyps 09/07/2017  . Dysphagia 09/07/2017  . Diabetes mellitus without complication ([place]) 29/51/8841  . GERD (gastroesophageal reflux disease) 02/27/2016  . Anxiety 02/27/2016  . S/P total knee replacement 02/13/2014  . RBBB 02/08/2014  . HTN (hypertension) 02/08/2014  . PVD - bilateral 60-79% carotid strenosis 02/08/2014  . Mixed hyperlipidemia 11/29/2013  . Carpal tunnel syndrome, bilateral 11/23/2013  . Murmur- mild -mod AR, mild MR 11/10/2013  . Constipation 12/16/2012    Conditions to be addressed/monitored: HTN and Cancer  Care Plan : Cancer Posttreatment Phase (Adult)  Updates made by Knox Royalty, RN since 11/20/2020 12:00 AM    Problem: Pain   Priority: Medium    Long-Range Goal: Pain and emotions managed   Start Date: 10/10/2020  Expected End Date: 01/10/2021  This Visit's Progress:  On track  Recent Progress: On track  Priority: Medium  Note:   Current Barriers:   Recent surgery x 2 for cancer treatment- ongoing recuperation/ pain  Clinical Goal(s):  Marland Kitchen Collaboration with [name], MD regarding development and update of comprehensive plan of care as evidenced by provider attestation and co-signature . Inter-disciplinary care team collaboration (see longitudinal plan of care) Over the next 3 months, patient will: . work with care management team to address care coordination and chronic disease management needs related to diagnosis and treatment plan of cancer with metastases to brain and pain control/ emotional lability after 2 recent surgeries Interventions:   Evaluation of current treatment plan related to cancer treatment, post-op course, pain control, emotional health self-management and patient's adherence to plan as established by provider.  Collaboration with [name], MD regarding development and update of comprehensive plan of care as evidenced by provider attestation       and co-signature  Inter-disciplinary care team collaboration (see longitudinal plan of care) . Chart reviewed including relevant office notes, upcoming scheduled appointments, and lab results . Discussed current clinical condition with caregiver/ daughter (  acute call): she denies current clinical concerns; however caregiver reports that patient has had increased anxiety over several days to re-scheduled psychiatry appointment.  Reports patient believes her anxiety is better controlled with Xanax than currently prescribed Clonopin.  Caregiver verbalizes plans to communicate with PCP via My-Chart to authorize that patient stop Clonopin and resume Xanax: this was encouraged- will message PCP and CCM Pharmacy team to reinforce patient's request . Caregiver/ daughter reports that she (caregiver) currently has been diagnosed with COVID: caregiver may not be able to attend upcoming scheduled  appointment with patient at oncology provider office visit, where she anticipates that patient will be given updated MRI results, which indicate new brain lesions: she is uncertain how to navigate this; confirmed that patient/ caregiver are active with oncology nurse navigator. Daughter was encouraged to reach out to oncology navigator to discuss her concerns around patient being provided MRI results when caregiver may not be present; she was encouraged to outreach oncology nurse navigator for guidance around these valid concerns, for specific direction: caregiver will contact oncology navigator for specific guidance around managing patient's anxiety during upcoming oncology provider appointment 11/22/20 . Confirmed caregiver has other family members available to provide transportation to provider appointments while caregiver is quarantined . Reviewed upcoming provider appointments with caregiver and confirmed that patient has plans to attend all as scheduled:  Dr. Roxan Hockey 11/20/20; Dr. Amalia Greenhouse 11/30/20; Dr. Mickeal Skinner 11/22/20; CCM pharmacy team 11/23/20 . Discussed plans with patient for ongoing care management follow up and provided patient with direct contact information for care management team Self Care Activities:  . Patient verbalizes understanding of plan to continue keeping care providers updated on her progress/ and to call for any questions or concerns . Attends all scheduled provider appointments . Calls pharmacy for medication refills . Performs ADL's and iADL's independently with minimal assistance as indicated from family members Patient Goals: Marland Kitchen Great job managing your medications at home; continue to call for prescription refill 3 days before needed . I am glad that your personal pain management plan has been working- if you have changes in your pain levels, please call your care providers . Do something like read, listen to music or watch television to keep mind off pain and  emotions . Plan exercise or activity when pain is best controlled: great job being active and getting out when you can each week . Prioritize tasks for the day . Track times pain and emotional distress is worst and when it is best . Track what makes the pain and emotional distress worse and what makes it better . Work slower and less intense when having pain- keep taking your time and be conservative on the days that you aren't feeling 100%  . I am very glad that Hannah [name]'s pain is under control: please keep Dr. Quay Burow and the oncology team updated on her pain levels, especially if there are changes  . Keep completing your journal about your emotions/ rest/ feelings so you can share this with Dr. Casimiro Needle during your upcoming scheduled appointment Follow Up Plan:  . Telephone follow up appointment with care management team member scheduled for: December 27, 2020 at 2:00 pm . The patient has been provided with contact information for the care management team and has been advised to call with any health related questions or concerns.     Care Plan : Hypertension (Adult)  Updates made by Knox Royalty, RN since 11/20/2020 12:00 AM    Problem: Hypertension (Hypertension)   Priority: Medium  Long-Range Goal: Hypertension Monitored   Start Date: 10/10/2020  Expected End Date: 04/12/2021  This Visit's Progress: On track  Recent Progress: On track  Priority: Medium  Note:   Objective:  . Last practice recorded BP readings:  BP Readings from Last 3 Encounters:  10/09/20 117/70  09/28/20 131/69  09/20/20 112/78   . Most recent eGFR/CrCl: No results found for: EGFR  No components found for: CRCL Current Barriers:  Marland Kitchen Knowledge Deficits related to basic understanding of hypertension pathophysiology and self care management- patient/ caregiver could benefit form ongoing reinforcement of HTN management/ plan of care . Recent surgeries for new diagnosis of cancer with metastases to brain; ongoing  recuperation with physical and emotional aspects of dealing with new diagnosis while managing chronic conditions Case Manager Clinical Goal(s):  Over the next 6 months, patient will: Marland Kitchen Demonstrate ongoing adherence to prescribed treatment plan for hypertension in setting of new cancer diagnosis/ treatment plan, as evidenced by taking all medications as prescribed, monitoring and recording blood pressure as directed, adhering to low sodium/DASH diet . Demonstrate improved health management independence as evidenced by checking blood pressure as directed and notifying care providers for concerns and /or questions Interventions:  . Collaboration with [name], MD regarding development and update of comprehensive plan of care as evidenced by provider attestation and co-signature . Inter-disciplinary care team collaboration (see longitudinal plan of care) . Chart reviewed including relevant office notes, upcoming scheduled appointments, and lab results . Re-confirmed caregiver continues monitoring blood pressures at home: denies concerns around same today; positive reinforcement provided- encouraged to continue monitoring blood pressures at home From 10/30/20 outreach: . Reviewed recent provider appointments, recent blood pressures during appointments and at home- no concerns identified: blood pressures maintained between 110-140/65-80 . Confirmed patient continues to follow heart healthy, low sodium diet and that her appetite has slowly starting improving, along with desired weight gain; reports most recent weight of 111 lbs- positive reinforcement provided Self-Care Activities: . Self administers medications as prescribed- receives minimal assistance from caregivers with fillinig of weekly pill box and reminding to take when due . Attends all scheduled provider appointments . Calls provider office for new concerns, questions, or BP outside discussed parameters . Checks BP and records as  discussed . Follows a low sodium diet/DASH diet Patient Goals: Marland Kitchen Great job monitoring your blood pressure regularly- keep up the great work: glad to hear that recent blood pressures at home have been stable! . Continue checking blood pressure at least 3 times per week . Write blood pressure results in a log or diary- we will review your blood pressures during our follow up calls . Continue taking your medications as prescribed . Continue following a low salt heart healthy diet- I am glad you have seen an improvement in your appetite and have gained more weight . As you continue recuperating from your recent surgeries, try to gradually increase your activity; pace activity with periods of rest and don't over-do activity . Contact your care providers/ doctors if you have changes or concerns about your blood pressure readings at home Follow Up Plan:  Telephone follow up appointment with care management team member scheduled for: December 27, 2020 at 2:00 pm The patient has been provided with contact information for the care management team and has been advised to call with any health related questions or concerns.       Plan:  Telephone follow up appointment with patient/ caregiver and care management team member scheduled for:  December 27, 2020 at 2:00 pm  The patient/ caregiver has been provided with contact information for the care management team and has been advised to call with any health related questions or concerns.   [name], RN, BSN, [facility] RN Care Coordination- [place] [phone]: direct office [phone]: mobile

## 2020-11-20 NOTE — Patient Instructions (Signed)
Visit Information  Hannah [name], it was nice talking with you today about your mother's health care.    I look forward to talking to you again for an update on December 27, 2020 at 2:00 pm- please be listening out for my call that day.  I will call as close to 2:00 pm as possible; I look forward to hearing about Hannah [name]'s progress.   Please don't hesitate to contact me if I can be of assistance to you before our next scheduled appointment.   [name], RN, BSN, [facility] RN Care Coordination- [place] [phone]: direct office [phone]: mobile    PATIENT GOALS: Goals Addressed            This Visit's Progress   . Keep Pain and emotions Under Control-Cancer   On track    Timeframe:  Long-Range Goal Priority:  Medium Start Date:           10/10/20                  Expected End Date:      01/10/21                 Follow Up Date 12/27/20   . Great job managing your medications at home; continue to call for prescription refill 3 days before needed . I am glad that your personal pain management plan has been working- if you have changes in your pain levels, please call your care providers . Do something like read, listen to music or watch television to keep mind off pain and emotions . Plan exercise or activity when pain is best controlled: great job being active and getting out when you can each week . Prioritize tasks for the day . Track times pain and emotional distress is worst and when it is best . Track what makes the pain and emotional distress worse and what makes it better . Work slower and less intense when having pain- keep taking your time and be conservative on the days that you aren't feeling 100%  . I am very glad that Hannah [name]'s pain is under control: please keep Dr. Quay Burow and the oncology team updated on her pain levels, especially if there are changes  . Keep completing your journal about your emotions/ rest/ feelings so you can share this  with Dr. Casimiro Needle during your upcoming scheduled appointment  Why is this important?    Day-to-day life can be hard when you have pain.   Even a small change in emotion or a physical problem can make pain better or worse.   Coping with pain depends on how the mind and body react to pain.   Pain medicine is just one piece of the treatment puzzle.   There are many tools to help manage pain. A combination of them can be used to best meet your needs.         . Track and Manage My Blood Pressure-Hypertension   On track    Timeframe:  Long-Range Goal Priority:  Medium Start Date:       10/10/20                      Expected End Date:   04/12/21                    Follow Up Date 12/27/20   . Great job monitoring your blood pressure regularly- keep up the great work:  glad to hear that recent blood pressures at home have been stable! . Continue checking blood pressure at least 3 times per week . Write blood pressure results in a log or diary- we will review your blood pressures during our follow up calls . Continue taking your medications as prescribed . Continue following a low salt heart healthy diet- I am glad you have seen an improvement in your appetite and have gained more weight . As you continue recuperating from your recent surgeries, try to gradually increase your activity; pace activity with periods of rest and don't over-do activity . Contact your care providers/ doctors if you have changes or concerns about your blood pressure readings at home   Why is this important?    You won't feel high blood pressure, but it can still hurt your blood vessels.   High blood pressure can cause heart or kidney problems. It can also cause a stroke.   Making lifestyle changes like losing a little weight or eating less salt will help.   Checking your blood pressure at home and at different times of the day can help to control blood pressure.   If the doctor prescribes medicine remember to  take it the way the doctor ordered.   Call the office if you cannot afford the medicine or if there are questions about it.            Patient's caregiver verbalizes understanding of instructions provided today and agrees to view in [place].   Telephone follow up appointment with patient/ caregiver and care management team member scheduled for: December 27, 2020 at 2:00 pm  The patient/ caregiver has been provided with contact information for the care management team and has been advised to call with any health related questions or concerns.   [name], RN, BSN, [facility] RN Care Coordination- [place] [phone]: direct office [phone]: mobile

## 2020-11-21 ENCOUNTER — Ambulatory Visit (HOSPITAL_COMMUNITY): Payer: Self-pay | Admitting: Psychiatry

## 2020-11-21 ENCOUNTER — Encounter: Payer: Self-pay | Admitting: Internal Medicine

## 2020-11-21 ENCOUNTER — Telehealth: Payer: Self-pay | Admitting: Pharmacist

## 2020-11-21 MED ORDER — ALPRAZOLAM 0.5 MG PO TABS: 0.5000 mg | ORAL_TABLET | Freq: Three times a day (TID) | ORAL | 5 refills | Status: DC | PRN

## 2020-11-21 NOTE — Progress Notes (Signed)
Called pt to introduce myself as her Arboriculturist.  Unfortunately there aren't any foundations offering copay assistance for her Dx and the type of ins she has.  Because the pt was unavailable I talked to her daughter and informed her of the [employer], went over what it covers and gave her the income requirement.  She thinks they are over the income requirement so she will check on it and call me if they qualify.

## 2020-11-22 ENCOUNTER — Inpatient Hospital Stay (HOSPITAL_BASED_OUTPATIENT_CLINIC_OR_DEPARTMENT_OTHER): Payer: Medicare HMO | Admitting: Internal Medicine

## 2020-11-22 ENCOUNTER — Ambulatory Visit
Admission: RE | Admit: 2020-11-22 | Discharge: 2020-11-22 | Disposition: A | Discharge status: Home / Self Care | Payer: Medicare HMO | Source: Ambulatory Visit | Attending: Radiation Oncology | Admitting: Radiation Oncology

## 2020-11-22 ENCOUNTER — Inpatient Hospital Stay: Payer: Medicare HMO

## 2020-11-22 ENCOUNTER — Other Ambulatory Visit: Payer: Self-pay

## 2020-11-22 VITALS — BP 135/73 | HR 67 | Temp 97.6°F | Resp 17 | Wt 116.6 lb

## 2020-11-22 DIAGNOSIS — C7931 Secondary malignant neoplasm of brain: Secondary | ICD-10-CM

## 2020-11-22 DIAGNOSIS — Z5112 Encounter for antineoplastic immunotherapy: Secondary | ICD-10-CM | POA: Diagnosis not present

## 2020-11-22 DIAGNOSIS — C3411 Malignant neoplasm of upper lobe, right bronchus or lung: Secondary | ICD-10-CM | POA: Diagnosis not present

## 2020-11-22 DIAGNOSIS — I10 Essential (primary) hypertension: Secondary | ICD-10-CM | POA: Diagnosis not present

## 2020-11-22 DIAGNOSIS — F419 Anxiety disorder, unspecified: Secondary | ICD-10-CM | POA: Diagnosis not present

## 2020-11-22 DIAGNOSIS — R635 Abnormal weight gain: Secondary | ICD-10-CM | POA: Diagnosis not present

## 2020-11-22 DIAGNOSIS — E119 Type 2 diabetes mellitus without complications: Secondary | ICD-10-CM | POA: Diagnosis not present

## 2020-11-22 DIAGNOSIS — E785 Hyperlipidemia, unspecified: Secondary | ICD-10-CM | POA: Diagnosis not present

## 2020-11-22 DIAGNOSIS — Z5111 Encounter for antineoplastic chemotherapy: Secondary | ICD-10-CM | POA: Diagnosis not present

## 2020-11-22 LAB — CMP (CANCER CENTER ONLY)
ALT: 14 U/L (ref 0–44)
AST: 19 U/L (ref 15–41)
Albumin: 3.7 g/dL (ref 3.5–5.0)
Alkaline Phosphatase: 83 U/L (ref 38–126)
Anion gap: 9 (ref 5–15)
BUN: 15 mg/dL (ref 8–23)
CO2: 31 mmol/L (ref 22–32)
Calcium: 10 mg/dL (ref 8.9–10.3)
Chloride: 102 mmol/L (ref 98–111)
Creatinine: 0.68 mg/dL (ref 0.44–1.00)
GFR, Estimated: >60 mL/min (ref >60)
Glucose, Bld: 79 mg/dL (ref 70–99)
Potassium: 4.1 mmol/L (ref 3.5–5.1)
Sodium: 142 mmol/L (ref 135–145)
Total Bilirubin: <0.2 mg/dL — ABNORMAL LOW (ref 0.3–1.2)
Total Protein: 7.1 g/dL (ref 6.5–8.1)

## 2020-11-22 LAB — CBC WITH DIFFERENTIAL (CANCER CENTER ONLY)
Abs Immature Granulocytes: 0.01 10*3/uL (ref 0.00–0.07)
Basophils Absolute: 0 10*3/uL (ref 0.0–0.1)
Basophils Relative: 1 %
Eosinophils Absolute: 0.2 10*3/uL (ref 0.0–0.5)
Eosinophils Relative: 4 %
HCT: 33.6 % — ABNORMAL LOW (ref 36.0–46.0)
Hemoglobin: 10.7 g/dL — ABNORMAL LOW (ref 12.0–15.0)
Immature Granulocytes: 0 %
Lymphocytes Relative: 40 %
Lymphs Abs: 1.8 10*3/uL (ref 0.7–4.0)
MCH: 26.8 pg (ref 26.0–34.0)
MCHC: 31.8 g/dL (ref 30.0–36.0)
MCV: 84 fL (ref 80.0–100.0)
Monocytes Absolute: 0.6 10*3/uL (ref 0.1–1.0)
Monocytes Relative: 14 %
Neutro Abs: 1.8 10*3/uL (ref 1.7–7.7)
Neutrophils Relative %: 41 %
Platelet Count: 225 10*3/uL (ref 150–400)
RBC: 4 MIL/uL (ref 3.87–5.11)
RDW: 13.7 % (ref 11.5–15.5)
WBC Count: 4.3 10*3/uL (ref 4.0–10.5)
nRBC: 0 % (ref 0.0–0.2)

## 2020-11-22 MED ORDER — SODIUM CHLORIDE 0.9% FLUSH
10.0000 mL | Freq: Once | INTRAVENOUS | Status: AC
  Administered 2020-11-22: 10 mL via INTRAVENOUS

## 2020-11-22 NOTE — Progress Notes (Signed)
Hannah [name] at Hannah [name], Hannah [name] [phone]   Interval Evaluation  Date of Service: 11/22/20 Patient Name: Hannah [name] Patient MRN: [id number] Patient DOB: May 28, 1951 Provider: Ventura Sellers, MD  Identifying Statement:  Hannah [name] is a 70 y.o. female with brain metastasis   Primary Cancer:  Oncology History  Primary cancer of right upper lobe of lung ([place])  08/07/2020 Initial Diagnosis   Primary cancer of right upper lobe of lung ([place])   09/20/2020 Cancer Staging   Staging form: Lung, AJCC 8th Edition - Clinical: Stage IVB (cT3, cN0, cM1c) - Signed by Curt Bears, MD on 09/20/2020   11/08/2020 -  Chemotherapy    Patient is on Treatment Plan: LUNG CARBOPLATIN / PEMETREXED / PEMBROLIZUMAB Q21D INDUCTION X 4 CYCLES / MAINTENANCE PEMETREXED + PEMBROLIZUMAB       Oncologic History 08/23/20: Pre-op SRS to L frontal mass Tammi Klippel) 08/24/20: Craniotomy, resection ([place])  Interval History:  Hannah [name] presents today for follow up after recent MRI brain.  She is walking well, doesn't have new issues with memory or cognition.  She is tolerating chemotherapy well overall.  Denies new or progressive neurologic deficits, seizures, headaches.  H+P (08/02/20) Patient presented to medical attention this past week with several days rapidly progressive speech impairment and right sided weakness.  She and her family describe difficulty putting complete sentences together, impaired use of right arm (for using fork, zipper, brushing teeth), and dragging of the right leg while walking.  She never needed assistance to walk.  CNS imaging demonstrated left frontal mass, and decadron was started over the weekend 4mg  twice per day.  She feels improved with regards to the right sided weakness with the steroids.  Otherwise no seizures, headaches.  Medications: Current Outpatient Medications on File Prior to Visit  Medication  Sig Dispense Refill  . acetaminophen (TYLENOL) 500 MG tablet Take 2 tablets (1,000 mg total) by mouth every 6 (six) hours. 30 tablet 0  . ALPRAZolam (XANAX) 0.5 MG tablet Take 1 tablet (0.5 mg total) by mouth 3 (three) times daily as needed for anxiety. 90 tablet 5  . Ascorbic Acid (VITAMIN C) 1000 MG tablet Take 1,000 mg by mouth daily.    Marland Kitchen aspirin 81 MG tablet Take 1 tablet (81 mg total) by mouth daily. Restart on 08/31/20 30 tablet   . carboxymethylcellulose (REFRESH PLUS) 0.5 % SOLN Place 1-2 drops into both eyes daily.    . folic acid (FOLVITE) 1 MG tablet Take 1 tablet (1 mg total) by mouth daily. 30 tablet 4  . hydrochlorothiazide (HYDRODIURIL) 12.5 MG tablet Take 1 tablet (12.5 mg total) by mouth daily. 90 tablet 1  . ketoconazole (NIZORAL) 2 % cream Apply 1 application topically 2 (two) times daily. 15 g 2  . loratadine (CLARITIN) 10 MG tablet Take 10 mg by mouth daily as needed for allergies.    . mirtazapine (REMERON) 30 MG tablet Take 1 tablet (30 mg total) by mouth at bedtime. 30 tablet 5  . neomycin-bacitracin-polymyxin (NEOSPORIN) ointment Apply 1 application topically as needed for wound care.    Marland Kitchen omeprazole (PRILOSEC) 40 MG capsule Take 1 capsule (40 mg total) by mouth 2 (two) times daily before a meal. 180 capsule 1  . polyethylene glycol (MIRALAX / GLYCOLAX) 17 g packet Take 17 g by mouth 2 (two) times daily. 72 each 0  . pravastatin (PRAVACHOL) 40 MG tablet Take 1 tablet (40 mg total)  by mouth every evening. 90 tablet 1  . Probiotic Product (PROBIOTIC DAILY PO) Take 1 capsule by mouth daily.    . prochlorperazine (COMPAZINE) 10 MG tablet Take 1 tablet (10 mg total) by mouth every 6 (six) hours as needed for nausea or vomiting. 30 tablet 0  . senna-docusate (SENOKOT-S) 8.6-50 MG tablet Take 1 tablet by mouth 2 (two) times daily. (Patient taking differently: Take 1 tablet by mouth daily as needed for mild constipation.)    . simethicone (MYLICON) 80 MG chewable tablet Chew 1  tablet (80 mg total) by mouth 4 (four) times daily as needed for flatulence (Bloating). 30 tablet 0  . traMADol (ULTRAM) 50 MG tablet Take 1-2 tablets (50-100 mg total) by mouth every 6 (six) hours as needed (mild pain). 30 tablet 0  . Wheat Dextrin (BENEFIBER) POWD Take 1 Scoop by mouth 2 (two) times daily as needed (constipation).     No current facility-administered medications on file prior to visit.    Allergies:  Allergies  Allergen Reactions  . Amlodipine Swelling and Rash    Rash, swelling  . Chantix [Varenicline Tartrate] Shortness Of Breath, Swelling and Other (See Comments)    Tongue swell,sob  . Clarithromycin Rash  . Lisinopril Hives  . Simvastatin Hives  . Wellbutrin [Bupropion Hcl] Hives  . Lipitor [Atorvastatin]     Dizziness per patient  . Sertraline     Makes her feel like she is going to kill someone   Past Medical History:  Past Medical History:  Diagnosis Date  . Allergy   . Anemia   . Anxiety   . Arthritis   . Carpal tunnel syndrome   . Constipation    senna C stool softeners help   . Depression   . Diverticulosis   . Dyslipidemia   . External hemorrhoids   . GERD (gastroesophageal reflux disease)   . Heart murmur    mild-moderate AR  . Hiatal hernia   . Hyperlipidemia    on meds   . Hypertension   . Internal hemorrhoids   . Osteoarthritis   . Pre-diabetes   . PVD (peripheral vascular disease) (HCC)    moderate carotid disease  . RBBB   . Smoker   . Vocal cord polyps    Past Surgical History:  Past Surgical History:  Procedure Laterality Date  . ABDOMINAL HYSTERECTOMY  1994  . APPLICATION OF CRANIAL NAVIGATION N/A 08/24/2020   Procedure: APPLICATION OF CRANIAL NAVIGATION;  Surgeon: Judith Part, MD;  Location: [place];  Service: Neurosurgery;  Laterality: N/A;  . COLONOSCOPY    . COLONOSCOPY W/ POLYPECTOMY  2009  . CRANIOTOMY Left 08/24/2020   Procedure: Left Craniotomy for Tumor Resection with Brainlab;  Surgeon: Judith Part, MD;  Location: [place];  Service: Neurosurgery;  Laterality: Left;  . HEMORRHOID SURGERY    . INTERCOSTAL NERVE BLOCK Right 09/24/2020   Procedure: INTERCOSTAL NERVE BLOCK;  Surgeon: Melrose Nakayama, MD;  Location: [place];  Service: Thoracic;  Laterality: Right;  . JOINT REPLACEMENT    . LYMPH NODE DISSECTION Right 09/24/2020   Procedure: LYMPH NODE DISSECTION;  Surgeon: Melrose Nakayama, MD;  Location: [place];  Service: Thoracic;  Laterality: Right;  . POLYPECTOMY    . right total knee arthroplasty     Dr. Emeterio Reeve 06-04-18  . SPINE SURGERY  08/16/2009  . TOTAL KNEE ARTHROPLASTY Left 02/13/2014   Procedure: TOTAL KNEE ARTHROPLASTY;  Surgeon: Alta Corning, MD;  Location: [place];  Service: Orthopedics;  Laterality: Left;  . TOTAL KNEE ARTHROPLASTY Right 06/04/2018   Procedure: RIGHT TOTAL KNEE ARTHROPLASTY;  Surgeon: Dorna Leitz, MD;  Location: [facility];  Service: Orthopedics;  Laterality: Right;  Adductor Block  . TUBAL LIGATION     Social History:  Social History   Socioeconomic History  . Marital status: Married    Spouse name: Not on file  . Number of children: 2  . Years of education: Not on file  . Highest education level: Not on file  Occupational History  . Not on file  Tobacco Use  . Smoking status: Former Smoker    Packs/day: 0.25    Years: 40.00    Pack years: 10.00    Types: Cigarettes    Quit date: 07/09/2020    Years since quitting: 0.3  . Smokeless tobacco: Never Used  . Tobacco comment: PACK WILL LAST 3 days  Vaping Use  . Vaping Use: Never used  Substance and Sexual Activity  . Alcohol use: No  . Drug use: No  . Sexual activity: Not Currently  Other Topics Concern  . Not on file  Social History Narrative  . Not on file   Social Determinants of Health   Financial Resource Strain: Low Risk   . Difficulty of Paying Living Expenses: Not hard at all  Food Insecurity: No Food Insecurity  . Worried About Charity fundraiser in the Last Year:  Never true  . Ran Out of Food in the Last Year: Never true  Transportation Needs: No Transportation Needs  . Lack of Transportation (Medical): No  . Lack of Transportation (Non-Medical): No  Physical Activity: Sufficiently Active  . Days of Exercise per Week: 4 days  . Minutes of Exercise per Session: 50 min  Stress: No Stress Concern Present  . Feeling of Stress : Not at all  Social Connections: Socially Integrated  . Frequency of Communication with Friends and Family: More than three times a week  . Frequency of Social Gatherings with Friends and Family: More than three times a week  . Attends Religious Services: More than 4 times per year  . Active Member of Clubs or Organizations: Yes  . Attends Archivist Meetings: More than 4 times per year  . Marital Status: Married  Human resources officer Violence: Not on file   Family History:  Family History  Problem Relation Age of Onset  . Cancer Father   . Prostate cancer Father   . Diabetes Sister   . Cancer Sister   . Stroke Maternal Grandfather   . Colitis Maternal Aunt   . Breast cancer Maternal Aunt   . Colon cancer Neg Hx   . Colon polyps Neg Hx   . Rectal cancer Neg Hx   . Stomach cancer Neg Hx   . Esophageal cancer Neg Hx     Review of Systems: Constitutional: Doesn't report fevers, chills or abnormal weight loss Eyes: Doesn't report blurriness of vision Ears, nose, mouth, throat, and face: Doesn't report sore throat Respiratory: Doesn't report cough, dyspnea or wheezes Cardiovascular: Doesn't report palpitation, chest discomfort  Gastrointestinal:  Doesn't report nausea, constipation, diarrhea GU: Doesn't report incontinence Skin: Doesn't report skin rashes Neurological: Per HPI Musculoskeletal: Doesn't report joint pain Behavioral/Psych: ++anxiety  Physical Exam: Vitals:   11/22/20 1148  BP: 135/73  Pulse: 67  Resp: 17  Temp: 97.6 F (36.4 C)  SpO2: 100%   KPS: 80. General: Alert, cooperative,  pleasant, in no acute distress Head: Normal  EENT: No conjunctival injection or scleral icterus.  Lungs: Resp effort normal Cardiac: Regular rate Abdomen: Non-distended abdomen Skin: No rashes cyanosis or petechiae. Extremities: No clubbing or edema  Neurologic Exam: Mental Status: Awake, alert, attentive to examiner. Oriented to self and environment. Language is intact with regards to fluency, comprehension.  Cranial Nerves: Visual acuity is grossly normal. Visual fields are full. Extra-ocular movements intact. No ptosis. Face is symmetric Motor: Tone and bulk are normal. Power is 5/5 throughout. Reflexes are symmetric, no pathologic reflexes present.  Sensory: Intact to light touch Gait: Independent  Labs: I have reviewed the data as listed    Component Value Date/Time   NA 142 11/15/2020 1041   K 3.9 11/15/2020 1041   CL 102 11/15/2020 1041   CO2 30 11/15/2020 1041   GLUCOSE 107 (H) 11/15/2020 1041   BUN 13 11/15/2020 1041   CREATININE 0.74 11/15/2020 1041   CREATININE 0.79 03/14/2020 1016   CALCIUM 9.9 11/15/2020 1041   PROT 7.2 11/15/2020 1041   ALBUMIN 3.7 11/15/2020 1041   AST 20 11/15/2020 1041   ALT 16 11/15/2020 1041   ALKPHOS 81 11/15/2020 1041   BILITOT 0.3 11/15/2020 1041   GFRNONAA >60 11/15/2020 1041   GFRNONAA 75 03/14/2020 1016   GFRAA 87 03/14/2020 1016   Lab Results  Component Value Date   WBC 3.4 (L) 11/15/2020   NEUTROABS 1.6 (L) 11/15/2020   HGB 11.1 (L) 11/15/2020   HCT 35.6 (L) 11/15/2020   MCV 84.8 11/15/2020   PLT 191 11/15/2020    Imaging:  [facility] Clinician Interpretation: I have personally reviewed the CNS images as listed.  My interpretation, in the context of the patient's clinical presentation, is progressive disease  DG Chest 2 View  Result Date: 11/20/2020 CLINICAL DATA:  History of lung carcinoma, initial encounter EXAM: CHEST - 2 VIEW COMPARISON:  10/09/2020 FINDINGS: Cardiac shadow is stable. Elevation of the right hemidiaphragm  is noted stable from the prior exam. Lungs are well aerated bilaterally. No sizable effusion is seen. No pneumothorax is noted. Postsurgical changes in the right hilum are noted contributing to the volume loss. Left lung is clear. Postsurgical changes in the upper lumbar spine are noted. IMPRESSION: Postsurgical changes on the right. No pneumothorax is noted. No sizable effusion is seen. Electronically Signed   By: Inez Catalina M.D.   On: 11/20/2020 16:08   MR BRAIN W WO CONTRAST  Result Date: 11/17/2020 CLINICAL DATA:  Assess treatment response. Follow-up SRS. Solitary metastasis with resection. Lung cancer, non-small cell. EXAM: MRI HEAD WITHOUT AND WITH CONTRAST TECHNIQUE: Multiplanar, multiecho pulse sequences of the brain and surrounding structures were obtained without and with intravenous contrast. CONTRAST:  27mL MULTIHANCE GADOBENATE DIMEGLUMINE 529 MG/ML IV SOLN COMPARISON:  08/25/2020 FINDINGS: BRAIN New Lesions: 1. Ring-enhancing cortically based mass in the low right parietal region measuring 9 mm on 11:80 2. 4 mm nodule in the left parietal cortex measured on 11:93. Larger lesions: None. Stable or Smaller lesions: The high left frontal resected metastasis shows regression of the hematoma cavity. No worrisome enhancement about this treatment site. Other Brain findings: Decreased edema associated with the left frontal treatment site. Mild edema is associated with the right more than left new cerebral metastases. No incidental infarct, acute hemorrhage, hydrocephalus, or collection. Vascular: Preserved flow voids and vascular enhancements Skull and upper cervical spine: Unremarkable craniotomy. No new or worrisome marrow enhancement. Sinuses/Orbits: Negative IMPRESSION: 1. Two new subcentimeter metastases in the bilateral cerebrum. 2. Evolving left frontal treatment  site without findings worrisome for residual. Electronically Signed   By: Monte Fantasia M.D.   On: 11/17/2020 15:46      Assessment/Plan Brain metastasis (HCC)  Antonio N Filippi is clinically stable today.  Brain MRI demonstrates good control of disease locally, but progression of disease distal to craniotomy site; two new small metastases within supratentorium which are currently asymptomatic.    Plan of care will likely include salvage SRS.  She is due for CT-simulation later today.  She is agreeable with this plan.  Not recommending steroids at this time due to low burden of edema, lack of symptoms.  We appreciate the opportunity to participate in the care of Hannah [name].    We ask that Hannah [name] return to clinic in 3 months following next brain MRI, or sooner as needed.  All questions were answered. The patient knows to call the clinic with any problems, questions or concerns. No barriers to learning were detected.  The total time spent in the encounter was 30 minutes and more than 50% was on counseling and review of test results   Ventura Sellers, MD Medical Director of Neuro-Oncology [facility] [place] 11/22/20 11:52 AM

## 2020-11-22 NOTE — Progress Notes (Signed)
Has armband been applied?  Yes.    Does patient have an allergy to IV contrast dye?: No.   Has patient ever received premedication for IV contrast dye?: No.   Does patient take metformin?: No.  If patient does take metformin when was the last dose: n/a  Date of lab work: 11/22/20 BUN: 13 CR: 0.68  IV site: antecubital right, condition patent  Has IV site been added to flowsheet?  Yes.

## 2020-11-23 ENCOUNTER — Telehealth: Payer: Self-pay | Admitting: Internal Medicine

## 2020-11-23 ENCOUNTER — Telehealth: Payer: Medicare HMO

## 2020-11-23 NOTE — Telephone Encounter (Signed)
R/s appt sper 5/26 sch msg. Pt's daughter is aware.

## 2020-11-23 NOTE — Progress Notes (Signed)
  Radiation Oncology         ([phone]) [phone] ________________________________  Name: Hannah [name] MRN: [id number]  Date: 11/22/2020  DOB: [birth year]  SIMULATION AND TREATMENT PLANNING NOTE    ICD-10-CM   1. Brain metastasis ([place])  C79.31     DIAGNOSIS:  70 yo woman with two subcentimeter brain metastases from adenocarcinoma of the right upper lung  NARRATIVE:  The patient was brought to the [facility].  Identity was confirmed.  All relevant records and images related to the planned course of therapy were reviewed.  The patient freely provided informed written consent to proceed with treatment after reviewing the details related to the planned course of therapy. The consent form was witnessed and verified by the simulation staff. Intravenous access was established for contrast administration. Then, the patient was set-up in a stable reproducible supine position for radiation therapy.  A relocatable thermoplastic stereotactic head frame was fabricated for precise immobilization.  CT images were obtained.  Surface markings were placed.  The CT images were loaded into the planning software and fused with the patient's targeting MRI scan.  Then the target and avoidance structures were contoured.  Treatment planning then occurred.  The radiation prescription was entered and confirmed.  I have requested 3D planning  I have requested a DVH of the following structures: Brain stem, brain, left eye, right eye, lenses, optic chiasm, target volumes, uninvolved brain, and normal tissue.    SPECIAL TREATMENT PROCEDURE:  The planned course of therapy using radiation constitutes a special treatment procedure. Special care is required in the management of this patient for the following reasons. This treatment constitutes a Special Treatment Procedure for the following reason: High dose per fraction requiring special monitoring for increased toxicities of treatment including daily imaging.  The special  nature of the planned course of radiotherapy will require increased physician supervision and oversight to ensure patient's safety with optimal treatment outcomes.  PLAN:  The patient will receive 20 Gy in 1 fraction to two targets  ________________________________  Sheral Apley. Tammi Klippel, M.D.

## 2020-11-27 DIAGNOSIS — C3411 Malignant neoplasm of upper lobe, right bronchus or lung: Secondary | ICD-10-CM | POA: Diagnosis not present

## 2020-11-27 DIAGNOSIS — C7931 Secondary malignant neoplasm of brain: Secondary | ICD-10-CM | POA: Diagnosis not present

## 2020-11-27 NOTE — Progress Notes (Signed)
    Chronic Care Management Pharmacy Assistant   Name: Hannah [name]  MRN: [id number] DOB: 12/21/1950   Medications: Outpatient Encounter Medications as of 11/21/2020  Medication Sig Note  . acetaminophen (TYLENOL) 500 MG tablet Take 2 tablets (1,000 mg total) by mouth every 6 (six) hours.   . ALPRAZolam (XANAX) 0.5 MG tablet Take 1 tablet (0.5 mg total) by mouth 3 (three) times daily as needed for anxiety.   . Ascorbic Acid (VITAMIN C) 1000 MG tablet Take 1,000 mg by mouth daily.   Marland Kitchen aspirin 81 MG tablet Take 1 tablet (81 mg total) by mouth daily. Restart on 08/31/20   . carboxymethylcellulose (REFRESH PLUS) 0.5 % SOLN Place 1-2 drops into both eyes daily.   . folic acid (FOLVITE) 1 MG tablet Take 1 tablet (1 mg total) by mouth daily.   . hydrochlorothiazide (HYDRODIURIL) 12.5 MG tablet Take 1 tablet (12.5 mg total) by mouth daily.   Marland Kitchen ketoconazole (NIZORAL) 2 % cream Apply 1 application topically 2 (two) times daily.   Marland Kitchen loratadine (CLARITIN) 10 MG tablet Take 10 mg by mouth daily as needed for allergies.   . mirtazapine (REMERON) 30 MG tablet Take 1 tablet (30 mg total) by mouth at bedtime.   Marland Kitchen neomycin-bacitracin-polymyxin (NEOSPORIN) ointment Apply 1 application topically as needed for wound care.   Marland Kitchen omeprazole (PRILOSEC) 40 MG capsule Take 1 capsule (40 mg total) by mouth 2 (two) times daily before a meal.   . polyethylene glycol (MIRALAX / GLYCOLAX) 17 g packet Take 17 g by mouth 2 (two) times daily.   . pravastatin (PRAVACHOL) 40 MG tablet Take 1 tablet (40 mg total) by mouth every evening.   . Probiotic Product (PROBIOTIC DAILY PO) Take 1 capsule by mouth daily.   . prochlorperazine (COMPAZINE) 10 MG tablet Take 1 tablet (10 mg total) by mouth every 6 (six) hours as needed for nausea or vomiting. (Patient not taking: Reported on 11/22/2020) 10/30/2020: 10/30/20: reports has not needed recently  . senna-docusate (SENOKOT-S) 8.6-50 MG tablet Take 1 tablet by mouth 2 (two) times daily.  (Patient taking differently: Take 1 tablet by mouth daily as needed for mild constipation.)   . simethicone (MYLICON) 80 MG chewable tablet Chew 1 tablet (80 mg total) by mouth 4 (four) times daily as needed for flatulence (Bloating).   . Wheat Dextrin (BENEFIBER) POWD Take 1 Scoop by mouth 2 (two) times daily as needed (constipation). (Patient not taking: Reported on 11/22/2020) 10/30/2020: 10/30/20: patient reports has not needed recently   . [DISCONTINUED] traMADol (ULTRAM) 50 MG tablet Take 1-2 tablets (50-100 mg total) by mouth every 6 (six) hours as needed (mild pain).    No facility-administered encounter medications on file as of 11/21/2020.    Made 3 attempts to call patient for and initial visit and to reschedule appointment with the clinical pharmacist Mendel Ryder. Left message  [facility] Pharmacist Assistant [phone]  Time spent:16

## 2020-11-28 ENCOUNTER — Telehealth: Payer: Self-pay | Admitting: Medical Oncology

## 2020-11-28 NOTE — Telephone Encounter (Signed)
Lab appt for tomorrow confirmed with pts daughter.

## 2020-11-29 ENCOUNTER — Other Ambulatory Visit: Payer: Self-pay

## 2020-11-29 ENCOUNTER — Inpatient Hospital Stay: Payer: Medicare HMO | Attending: Internal Medicine

## 2020-11-29 ENCOUNTER — Ambulatory Visit
Admission: RE | Admit: 2020-11-29 | Discharge: 2020-11-29 | Disposition: A | Discharge status: Home / Self Care | Payer: Medicare HMO | Source: Ambulatory Visit | Attending: Radiation Oncology | Admitting: Radiation Oncology

## 2020-11-29 ENCOUNTER — Other Ambulatory Visit: Payer: Medicare HMO

## 2020-11-29 ENCOUNTER — Ambulatory Visit: Payer: Medicare HMO

## 2020-11-29 ENCOUNTER — Encounter: Payer: Self-pay | Admitting: Radiation Oncology

## 2020-11-29 ENCOUNTER — Ambulatory Visit: Payer: Medicare HMO | Admitting: Physician Assistant

## 2020-11-29 VITALS — BP 138/80 | HR 79 | Temp 96.7°F | Resp 18

## 2020-11-29 DIAGNOSIS — Z8719 Personal history of other diseases of the digestive system: Secondary | ICD-10-CM | POA: Insufficient documentation

## 2020-11-29 DIAGNOSIS — F419 Anxiety disorder, unspecified: Secondary | ICD-10-CM | POA: Insufficient documentation

## 2020-11-29 DIAGNOSIS — C7931 Secondary malignant neoplasm of brain: Secondary | ICD-10-CM | POA: Diagnosis not present

## 2020-11-29 DIAGNOSIS — I10 Essential (primary) hypertension: Secondary | ICD-10-CM | POA: Diagnosis not present

## 2020-11-29 DIAGNOSIS — C3411 Malignant neoplasm of upper lobe, right bronchus or lung: Secondary | ICD-10-CM

## 2020-11-29 DIAGNOSIS — Z5111 Encounter for antineoplastic chemotherapy: Secondary | ICD-10-CM | POA: Diagnosis not present

## 2020-11-29 DIAGNOSIS — E785 Hyperlipidemia, unspecified: Secondary | ICD-10-CM | POA: Diagnosis not present

## 2020-11-29 DIAGNOSIS — K219 Gastro-esophageal reflux disease without esophagitis: Secondary | ICD-10-CM | POA: Insufficient documentation

## 2020-11-29 DIAGNOSIS — R5383 Other fatigue: Secondary | ICD-10-CM | POA: Insufficient documentation

## 2020-11-29 DIAGNOSIS — Z888 Allergy status to other drugs, medicaments and biological substances status: Secondary | ICD-10-CM | POA: Insufficient documentation

## 2020-11-29 DIAGNOSIS — Z87891 Personal history of nicotine dependence: Secondary | ICD-10-CM | POA: Diagnosis not present

## 2020-11-29 DIAGNOSIS — Z79899 Other long term (current) drug therapy: Secondary | ICD-10-CM | POA: Insufficient documentation

## 2020-11-29 DIAGNOSIS — Z5112 Encounter for antineoplastic immunotherapy: Secondary | ICD-10-CM | POA: Insufficient documentation

## 2020-11-29 DIAGNOSIS — F32A Depression, unspecified: Secondary | ICD-10-CM | POA: Insufficient documentation

## 2020-11-29 LAB — CBC WITH DIFFERENTIAL (CANCER CENTER ONLY)
Abs Immature Granulocytes: 0.02 10*3/uL (ref 0.00–0.07)
Basophils Absolute: 0.1 10*3/uL (ref 0.0–0.1)
Basophils Relative: 1 %
Eosinophils Absolute: 0.1 10*3/uL (ref 0.0–0.5)
Eosinophils Relative: 3 %
HCT: 35.8 % — ABNORMAL LOW (ref 36.0–46.0)
Hemoglobin: 11.5 g/dL — ABNORMAL LOW (ref 12.0–15.0)
Immature Granulocytes: 1 %
Lymphocytes Relative: 40 %
Lymphs Abs: 1.8 10*3/uL (ref 0.7–4.0)
MCH: 26.6 pg (ref 26.0–34.0)
MCHC: 32.1 g/dL (ref 30.0–36.0)
MCV: 82.9 fL (ref 80.0–100.0)
Monocytes Absolute: 0.5 10*3/uL (ref 0.1–1.0)
Monocytes Relative: 12 %
Neutro Abs: 1.9 10*3/uL (ref 1.7–7.7)
Neutrophils Relative %: 43 %
Platelet Count: 287 10*3/uL (ref 150–400)
RBC: 4.32 MIL/uL (ref 3.87–5.11)
RDW: 13.5 % (ref 11.5–15.5)
WBC Count: 4.4 10*3/uL (ref 4.0–10.5)
nRBC: 0 % (ref 0.0–0.2)

## 2020-11-29 LAB — CMP (CANCER CENTER ONLY)
ALT: 13 U/L (ref 0–44)
AST: 21 U/L (ref 15–41)
Albumin: 3.9 g/dL (ref 3.5–5.0)
Alkaline Phosphatase: 99 U/L (ref 38–126)
Anion gap: 12 (ref 5–15)
BUN: 11 mg/dL (ref 8–23)
CO2: 27 mmol/L (ref 22–32)
Calcium: 10.4 mg/dL — ABNORMAL HIGH (ref 8.9–10.3)
Chloride: 102 mmol/L (ref 98–111)
Creatinine: 0.78 mg/dL (ref 0.44–1.00)
GFR, Estimated: >60 mL/min (ref >60)
Glucose, Bld: 110 mg/dL — ABNORMAL HIGH (ref 70–99)
Potassium: 4.1 mmol/L (ref 3.5–5.1)
Sodium: 141 mmol/L (ref 135–145)
Total Bilirubin: <0.2 mg/dL — ABNORMAL LOW (ref 0.3–1.2)
Total Protein: 7.7 g/dL (ref 6.5–8.1)

## 2020-11-29 NOTE — Op Note (Signed)
  Name: Hannah [name]  MRN: [id number]  Date: 11/29/2020   DOB: [birth year]  Stereotactic Radiosurgery Operative Note  PRE-OPERATIVE DIAGNOSIS:  Multiple Brain Metastases  POST-OPERATIVE DIAGNOSIS:  Multiple Brain Metastases  PROCEDURE:  Stereotactic Radiosurgery  SURGEON:  Peggyann Shoals, MD  NARRATIVE: The patient underwent a radiation treatment planning session in the radiation oncology simulation suite under the care of the radiation oncology physician and physicist.  I participated closely in the radiation treatment planning afterwards. The patient underwent planning CT which was fused to 3T high resolution MRI with 1 mm axial slices.  These images were fused on the planning system.  We contoured the gross target volumes and subsequently expanded this to yield the Planning Target Volume. I actively participated in the planning process.  I helped to define and review the target contours and also the contours of the optic pathway, eyes, brainstem and selected nearby organs at risk.  All the dose constraints for critical structures were reviewed and compared to AAPM Task Group 101.  The prescription dose conformity was reviewed.  I approved the plan electronically.    Accordingly, Hannah [name] was brought to the TrueBeam stereotactic radiation treatment linac and placed in the custom immobilization mask.  The patient was aligned according to the IR fiducial markers with BrainLab Exactrac, then orthogonal x-rays were used in ExacTrac with the 6DOF robotic table and the shifts were made to align the patient  Hannah [name] received stereotactic radiosurgery uneventfully.    Lesions treated:  2   Complex lesions treated:  0 (>3.5 cm, <53mm of optic path, or within the brainstem)   The detailed description of the procedure is recorded in the radiation oncology procedure note.  I was present for the duration of the procedure.  DISPOSITION:  Following delivery, the patient was transported to  nursing in stable condition and monitored for possible acute effects to be discharged to home in stable condition with follow-up in one month.  Peggyann Shoals, MD 11/29/2020 12:06 PM

## 2020-11-29 NOTE — Progress Notes (Signed)
  Radiation Oncology         [phone] ________________________________  Stereotactic Treatment Procedure Note  Name: SIMCHA FARRINGTON MRN: [id number]  Date: 11/29/2020  DOB: 1950/12/30  SPECIAL TREATMENT PROCEDURE    ICD-10-CM   1. Brain metastasis ([place])  C79.31     3D TREATMENT PLANNING AND DOSIMETRY:  The patient's radiation plan was reviewed and approved by neurosurgery and radiation oncology prior to treatment.  It showed 3-dimensional radiation distributions overlaid onto the planning CT/MRI image set.  The [facility] for the target structures as well as the organs at risk were reviewed. The documentation of the 3D plan and dosimetry are filed in the radiation oncology EMR.  NARRATIVE:  TWANNA RESH was brought to the TrueBeam stereotactic radiation treatment machine and placed supine on the CT couch. The head frame was applied, and the patient was set up for stereotactic radiosurgery.  Neurosurgery was present for the set-up and delivery  SIMULATION VERIFICATION:  In the couch zero-angle position, the patient underwent Exactrac imaging using the Brainlab system with orthogonal KV images.  These were carefully aligned and repeated to confirm treatment position for each of the isocenters.  The Exactrac snap film verification was repeated at each couch angle.  PROCEDURE: [name] received stereotactic radiosurgery to the following targets: Right parietal 9 mm target was treated using 3 Dynamic Conformal Arcs to a prescription dose of 20 Gy.  ExacTrac registration was performed for each couch angle.  The 100% isodose line was prescribed.  6 MV X-rays were delivered in the flattening filter free beam mode. Left parietal 4 mm target was treated using 3 Dynamic Conformal Arcs to a prescription dose of 20 Gy.  ExacTrac registration was performed for each couch angle.  The 100% isodose line was prescribed.  6 MV X-rays were delivered in the flattening filter free beam mode.  STEREOTACTIC  TREATMENT MANAGEMENT:  Following delivery, the patient was transported to nursing in stable condition and monitored for possible acute effects.  Vital signs were recorded BP 138/80   Pulse 79   Temp (!) 96.7 F (35.9 C)   Resp 18 . The patient tolerated treatment without significant acute effects, and was discharged to home in stable condition.    PLAN: Follow-up in one month.  ________________________________  Sheral Apley. Tammi Klippel, M.D.

## 2020-11-29 NOTE — Progress Notes (Signed)
Nurse monitoring following SRS treatment complete. Vitals stable. Patient denies development of any new neurologic symptoms. Instructed patient to avoid strenuous activity for the next 24 hours . Instructed patient to call [phone] with needs or concerns related to today's treatment. Answered all patient questions to the best of my ability. Patient and sister verbalized understanding of all reviewed.Patient discharged home with her sister. Patient exited the clinic ambulatory and in stable condition.

## 2020-11-30 ENCOUNTER — Ambulatory Visit (INDEPENDENT_AMBULATORY_CARE_PROVIDER_SITE_OTHER): Payer: Medicare HMO | Admitting: Psychiatry

## 2020-11-30 DIAGNOSIS — F4323 Adjustment disorder with mixed anxiety and depressed mood: Secondary | ICD-10-CM | POA: Diagnosis not present

## 2020-11-30 MED ORDER — MIRTAZAPINE 45 MG PO TABS: 45.0000 mg | ORAL_TABLET | Freq: Every day | ORAL | 3 refills | Status: DC

## 2020-11-30 NOTE — Progress Notes (Signed)
[place] OFFICE PROGRESS NOTE  [name], [place] [address]  DIAGNOSIS: Stage IVnon-small cell lung cancer(T, N0, M1C)adenocarcinoma. The patient presented with aright upper lobe lung massand solitary brain metastasis. The patient was diagnosed in February 2022.  Biomarker Findings Microsatellite status - MS-Stable Tumor Mutational Burden - 8 Muts/Mb Genomic Findings For a complete list of the genes assayed, please refer to the Appendix. KRAS G12V KEAP1 S224F TP53 P151T 7 Disease relevant genes with no reportable alterations: ALK, BRAF, EGFR, ERBB2, MET, RET, ROS1  PDL1 Expression:90%  PRIOR THERAPY: 1)SRS to the metastatic brain lesion on 2/24/2022under the care of Dr. Lenice Llamas craniotomy under the care of Dr. Ostergardon2/25/2022. 2)S/p robotic assisted right upper lobectomy with en bloc wedge resection of the right middle lobe and lymph node dissection under the care of Dr. Roxan Hockey on September 24, 2020 3) SRS to the two new subcentimeter metastases under the care of Dr. Tammi Klippel on 11/29/20.   CURRENT THERAPY: Systemic chemotherapy with carboplatin for AUC of 5, Alimta 500 Mg/M2 and Keytruda 200 mg IV every 3 weeks. First dose Nov 08, 2020. Status post 1 cycle.    INTERVAL HISTORY: HEAVENLEE MAIORANA 70 y.o. female returns  to the clinic today forafollow-up visitaccompanied by her family member. The patient is feeling "pretty good" today. In the interval since her last appointment, she was found to have two new subcentimeter brain metastases. She had SRS on 11/29/20 and tolerated it well without any concerning adverse side effects.   Regarding her chemotherapy, she is status post 1 cycle and tolerated it well. She also continues to have some stress and anxiety adjusting to her recent diagnosis. She was able to see a psychiatrist last week which was helpful to her. She is going to get therapy next month. They also  increased her remeron. She denies changes with her breathing. She denies chest pain, shortness of breath, cough, or hemoptysis.   She denies fevers or chills. Her weight is stable and her appetite is good. She has some baseline night sweats. She she denies any nausea, vomiting, diarrhea, or constipation. She denies any rashes or skin changes. She is here for evaluation before starting cycle #2.    MEDICAL HISTORY: Past Medical History:  Diagnosis Date  . Allergy   . Anemia   . Anxiety   . Arthritis   . Carpal tunnel syndrome   . Constipation    senna C stool softeners help   . Depression   . Diverticulosis   . Dyslipidemia   . External hemorrhoids   . GERD (gastroesophageal reflux disease)   . Heart murmur    mild-moderate AR  . Hiatal hernia   . Hyperlipidemia    on meds   . Hypertension   . Internal hemorrhoids   . Osteoarthritis   . Pre-diabetes   . PVD (peripheral vascular disease) (HCC)    moderate carotid disease  . RBBB   . Smoker   . Vocal cord polyps     ALLERGIES:  is allergic to amlodipine, chantix [varenicline tartrate], clarithromycin, lisinopril, simvastatin, wellbutrin [bupropion hcl], lipitor [atorvastatin], and sertraline.  MEDICATIONS:  Current Outpatient Medications  Medication Sig Dispense Refill  . acetaminophen (TYLENOL) 500 MG tablet Take 2 tablets (1,000 mg total) by mouth every 6 (six) hours. 30 tablet 0  . ALPRAZolam (XANAX) 0.5 MG tablet Take 1 tablet (0.5 mg total) by mouth 3 (three) times daily as needed for anxiety. 90 tablet 5  .  Ascorbic Acid (VITAMIN C) 1000 MG tablet Take 1,000 mg by mouth daily.    Marland Kitchen aspirin 81 MG tablet Take 1 tablet (81 mg total) by mouth daily. Restart on 08/31/20 30 tablet   . carboxymethylcellulose (REFRESH PLUS) 0.5 % SOLN Place 1-2 drops into both eyes daily.    . folic acid (FOLVITE) 1 MG tablet Take 1 tablet (1 mg total) by mouth daily. 30 tablet 4  . hydrochlorothiazide (HYDRODIURIL) 12.5 MG tablet Take 1 tablet  (12.5 mg total) by mouth daily. 90 tablet 1  . ketoconazole (NIZORAL) 2 % cream Apply 1 application topically 2 (two) times daily. 15 g 2  . loratadine (CLARITIN) 10 MG tablet Take 10 mg by mouth daily as needed for allergies.    . mirtazapine (REMERON) 45 MG tablet Take 1 tablet (45 mg total) by mouth at bedtime. 30 tablet 3  . neomycin-bacitracin-polymyxin (NEOSPORIN) ointment Apply 1 application topically as needed for wound care.    Marland Kitchen omeprazole (PRILOSEC) 40 MG capsule Take 1 capsule (40 mg total) by mouth 2 (two) times daily before a meal. 180 capsule 1  . polyethylene glycol (MIRALAX / GLYCOLAX) 17 g packet Take 17 g by mouth 2 (two) times daily. 72 each 0  . pravastatin (PRAVACHOL) 40 MG tablet Take 1 tablet (40 mg total) by mouth every evening. 90 tablet 1  . Probiotic Product (PROBIOTIC DAILY PO) Take 1 capsule by mouth daily.    . prochlorperazine (COMPAZINE) 10 MG tablet Take 1 tablet (10 mg total) by mouth every 6 (six) hours as needed for nausea or vomiting. (Patient not taking: Reported on 11/22/2020) 30 tablet 0  . senna-docusate (SENOKOT-S) 8.6-50 MG tablet Take 1 tablet by mouth 2 (two) times daily. (Patient taking differently: Take 1 tablet by mouth daily as needed for mild constipation.)    . simethicone (MYLICON) 80 MG chewable tablet Chew 1 tablet (80 mg total) by mouth 4 (four) times daily as needed for flatulence (Bloating). 30 tablet 0  . Wheat Dextrin (BENEFIBER) POWD Take 1 Scoop by mouth 2 (two) times daily as needed (constipation). (Patient not taking: Reported on 11/22/2020)     No current facility-administered medications for this visit.    SURGICAL HISTORY:  Past Surgical History:  Procedure Laterality Date  . ABDOMINAL HYSTERECTOMY  1994  . APPLICATION OF CRANIAL NAVIGATION N/A 08/24/2020   Procedure: APPLICATION OF CRANIAL NAVIGATION;  Surgeon: Judith Part, MD;  Location: [place];  Service: Neurosurgery;  Laterality: N/A;  . COLONOSCOPY    . COLONOSCOPY W/  POLYPECTOMY  2009  . CRANIOTOMY Left 08/24/2020   Procedure: Left Craniotomy for Tumor Resection with Brainlab;  Surgeon: Judith Part, MD;  Location: [facility];  Service: Neurosurgery;  Laterality: Left;  . HEMORRHOID SURGERY    . INTERCOSTAL NERVE BLOCK Right 09/24/2020   Procedure: INTERCOSTAL NERVE BLOCK;  Surgeon: Melrose Nakayama, MD;  Location: [facility];  Service: Thoracic;  Laterality: Right;  . JOINT REPLACEMENT    . LYMPH NODE DISSECTION Right 09/24/2020   Procedure: LYMPH NODE DISSECTION;  Surgeon: Melrose Nakayama, MD;  Location: [facility];  Service: Thoracic;  Laterality: Right;  . POLYPECTOMY    . right total knee arthroplasty     Dr. Emeterio Reeve 06-04-18  . SPINE SURGERY  08/16/2009  . TOTAL KNEE ARTHROPLASTY Left 02/13/2014   Procedure: TOTAL KNEE ARTHROPLASTY;  Surgeon: Alta Corning, MD;  Location: [facility];  Service: Orthopedics;  Laterality: Left;  . TOTAL KNEE ARTHROPLASTY Right  06/04/2018   Procedure: RIGHT TOTAL KNEE ARTHROPLASTY;  Surgeon: Dorna Leitz, MD;  Location: [facility];  Service: Orthopedics;  Laterality: Right;  Adductor Block  . TUBAL LIGATION      REVIEW OF SYSTEMS:   Review of Systems  Constitutional: Negative for appetite change, chills, fatigue, fever and unexpected weight change.  HENT: Negative for mouth sores, nosebleeds, sore throat and trouble swallowing.   Eyes: Negative for eye problems and icterus.  Respiratory: Negative for cough, hemoptysis, shortness of breath and wheezing.   Cardiovascular: Negative for chest pain and leg swelling.  Gastrointestinal: Negative for abdominal pain, constipation, diarrhea, nausea and vomiting.  Genitourinary: Negative for bladder incontinence, difficulty urinating, dysuria, frequency and hematuria.   Musculoskeletal: Negative for back pain, gait problem, neck pain and neck stiffness.  Skin: Negative for itching and rash.  Neurological: Negative for dizziness, extremity weakness, gait problem, headaches,  light-headedness and seizures.  Hematological: Negative for adenopathy. Does not bruise/bleed easily.  Psychiatric/Behavioral: Negative for confusion, depression and sleep disturbance. The patient is not nervous/anxious.     PHYSICAL EXAMINATION:  Blood pressure (!) 144/73, pulse 76, temperature 98 F (36.7 C), temperature source Oral, resp. rate 18, weight 116 lb 9.6 oz (52.9 kg), SpO2 100 %.  ECOG PERFORMANCE STATUS: 1 - Symptomatic but completely ambulatory  Physical Exam  Constitutional: Oriented to person, place, and time andthin appearing femaleand in no distress.  HENT:  Head: Normocephalic and atraumatic.  Mouth/Throat: Oropharynx is clear and moist. No oropharyngeal exudate.  Eyes: Conjunctivae are normal. Right eye exhibits no discharge. Left eye exhibits no discharge. No scleral icterus.  Neck: Normal range of motion. Neck supple.  Cardiovascular: Normal rate, regular rhythm,mild systolic murmur notedand intact distal pulses.  Pulmonary/Chest: Effort normal and breath sounds normal. No respiratory distress. No wheezes. No rales.  Abdominal: Soft. Bowel sounds are normal. Exhibits no distension and no mass. There is no tenderness.  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 No weakness in hands. No decreased sensation. No swelling or erythema. Some tenderness to palpation over MCP. Normal range of motion. Exhibits no edema.  Lymphadenopathy:  No cervical adenopathy.  Neurological: Alert and oriented to person, place, and time. Exhibits normal muscle tone. Gait normal. Coordination normal.  Skin: Skin is warm and dry. No rash noted. Not diaphoretic. No erythema. No pallor.  Psychiatric: Mood, memory and judgment normal.  Vitals reviewed  LABORATORY DATA: Lab Results  Component Value Date   WBC 5.0 12/05/2020   HGB 11.7 (L) 12/05/2020   HCT 36.7 12/05/2020   MCV 83.0 12/05/2020   PLT 258 12/05/2020      Chemistry      Component Value Date/Time   NA 142  12/05/2020 0737   K 4.2 12/05/2020 0737   CL 102 12/05/2020 0737   CO2 26 12/05/2020 0737   BUN 11 12/05/2020 0737   CREATININE 0.78 12/05/2020 0737   CREATININE 0.79 03/14/2020 1016      Component Value Date/Time   CALCIUM 10.3 12/05/2020 0737   ALKPHOS 79 12/05/2020 0737   AST 21 12/05/2020 0737   ALT 13 12/05/2020 0737   BILITOT 0.2 (L) 12/05/2020 0737       RADIOGRAPHIC STUDIES:  DG Chest 2 View  Result Date: 11/20/2020 CLINICAL DATA:  History of lung carcinoma, initial encounter EXAM: CHEST - 2 VIEW COMPARISON:  10/09/2020 FINDINGS: Cardiac shadow is stable. Elevation of the right hemidiaphragm is noted stable from the prior exam. Lungs are well aerated bilaterally. No sizable effusion is  seen. No pneumothorax is noted. Postsurgical changes in the right hilum are noted contributing to the volume loss. Left lung is clear. Postsurgical changes in the upper lumbar spine are noted. IMPRESSION: Postsurgical changes on the right. No pneumothorax is noted. No sizable effusion is seen. Electronically Signed   By: Inez Catalina M.D.   On: 11/20/2020 16:08   MR BRAIN W WO CONTRAST  Result Date: 11/17/2020 CLINICAL DATA:  Assess treatment response. Follow-up SRS. Solitary metastasis with resection. Lung cancer, non-small cell. EXAM: MRI HEAD WITHOUT AND WITH CONTRAST TECHNIQUE: Multiplanar, multiecho pulse sequences of the brain and surrounding structures were obtained without and with intravenous contrast. CONTRAST:  48mL MULTIHANCE GADOBENATE DIMEGLUMINE 529 MG/ML IV SOLN COMPARISON:  08/25/2020 FINDINGS: BRAIN New Lesions: 1. Ring-enhancing cortically based mass in the low right parietal region measuring 9 mm on 11:80 2. 4 mm nodule in the left parietal cortex measured on 11:93. Larger lesions: None. Stable or Smaller lesions: The high left frontal resected metastasis shows regression of the hematoma cavity. No worrisome enhancement about this treatment site. Other Brain findings: Decreased  edema associated with the left frontal treatment site. Mild edema is associated with the right more than left new cerebral metastases. No incidental infarct, acute hemorrhage, hydrocephalus, or collection. Vascular: Preserved flow voids and vascular enhancements Skull and upper cervical spine: Unremarkable craniotomy. No new or worrisome marrow enhancement. Sinuses/Orbits: Negative IMPRESSION: 1. Two new subcentimeter metastases in the bilateral cerebrum. 2. Evolving left frontal treatment site without findings worrisome for residual. Electronically Signed   By: Monte Fantasia M.D.   On: 11/17/2020 15:46     ASSESSMENT/PLAN:  This is a very pleasant 70 year old African-American female diagnosed with stage IV (T3, N0, M1 B) non-small cell lung cancer, adenocarcinoma. She presented with a right upper lobe lung mass with a solitary brain metastasis. She was diagnosed in February 2022. Her PD-L1 expression is 90%.She does not have any actionable mutations.  The patient completed SRS followed by craniotomy and resectionon 2/24/2022under the care of Dr. Lenice Llamas craniotomy under the care of Dr. Ostergardon2/25/2022.  She then had a robot-assisted right upper lobectomy with en bloc wedge resection of the right middle lobe and lymph node dissection under the care of Dr. Roxan Hockey on September 24, 2020.  The patient is currently undergoing systemic chemotherapy with 4-6 cycles of carboplatin for an AUC of 5, Alimta 500 mg per metered squared, Keytruda 200 mg IV every 3 weeks. She is status post 1 cycle and tolerated it well without any adverse side effects.   She underwent SRS to the two new subcentimeter brain metastases on 11/29/20.    Labs were reviewed. Recommend that she proceed with cycle #2 today as scheduled.   We will see her back for a follow up visit in 3 weeks for evaluation before starting cycle #3.   The patient was advised to call immediately if she has any concerning symptoms  in the interval. The patient voices understanding of current disease status and treatment options and is in agreement with the current care plan. All questions were answered. The patient knows to call the clinic with any problems, questions or concerns. We can certainly see the patient much sooner if necessary        No orders of the defined types were placed in this encounter.    I spent 20-29 minutes in this encounter.   Arasely Akkerman L Denine Brotz, PA-C 12/05/20

## 2020-11-30 NOTE — Progress Notes (Signed)
Psychiatric Initial Adult Assessment   Patient Identification: Hannah [name] MRN:  [id number] Date of Evaluation:  11/30/2020 Referral Source: From the community Chief Complaint:   Visit Diagnosis: No diagnosis found.  History of Present Illness:   This patient is a 70 year old married African-American female with this year was diagnosed with lung cancer.  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  Her family believes it is the left frontal area.  Since that the patient has been experiencing persistent daily depression.  She has no other vegetative symptoms.  She is sleeping and eating fairly well.  It is believed that since being on the Remeron she has been sleeping better and eating better.  Her energy level is good.  She denies worthlessness.  She has no problems with thinking and concentrating.  According to her family she demonstrated no cognitive problems.  She is not suicidal now and never has been.  She denies any use of alcohol or drugs.  She has never had any psychotic symptoms.  The patient denies symptoms of generalized anxiety disorder or panic disorder or obsessive-compulsive disorder.  It is not clear if she is ever had a distinct episode of major depression in the past.  The patient been married for 56 years and a good marriage.  Today she is seen with her daughter and her husband.  The patient has 3 children and 2 grandchildren and 1 great grandson.  Her medical history includes lung cancer status post lung and brain surgery.  She has hypertension and hypercholesterolemia.  She has never been a patient in a psychiatric hospital before she never been in psychotherapy before.  The patient denies chest pain or shortness of breath or any neurological symptoms at this time.  At 1 point she was on steroids after the brain surgery but that has ended over a month ago.  Associated Signs/Symptoms: Depression Symptoms:  depressed  mood, (Hypo) Manic Symptoms:   Anxiety Symptoms:   Psychotic Symptoms:   PTSD Symptoms:   Past Psychiatric History:   Previous Psychotropic Medications:   Substance Abuse History in the last 12 months:    Consequences of Substance Abuse:   Past Medical History:  Past Medical History:  Diagnosis Date  . Allergy   . Anemia   . Anxiety   . Arthritis   . Carpal tunnel syndrome   . Constipation    senna C stool softeners help   . Depression   . Diverticulosis   . Dyslipidemia   . External hemorrhoids   . GERD (gastroesophageal reflux disease)   . Heart murmur    mild-moderate AR  . Hiatal hernia   . Hyperlipidemia    on meds   . Hypertension   . Internal hemorrhoids   . Osteoarthritis   . Pre-diabetes   . PVD (peripheral vascular disease) (HCC)    moderate carotid disease  . RBBB   . Smoker   . Vocal cord polyps     Past Surgical History:  Procedure Laterality Date  . ABDOMINAL HYSTERECTOMY  1994  . APPLICATION OF CRANIAL NAVIGATION N/A 08/24/2020   Procedure: APPLICATION OF CRANIAL NAVIGATION;  Surgeon: Judith Part, MD;  Location: [facility];  Service: Neurosurgery;  Laterality: N/A;  . COLONOSCOPY    . COLONOSCOPY W/ POLYPECTOMY  2009  . CRANIOTOMY Left 08/24/2020   Procedure: Left Craniotomy for Tumor Resection with Brainlab;  Surgeon: Judith Part, MD;  Location:  [facility];  Service: Neurosurgery;  Laterality: Left;  . HEMORRHOID SURGERY    . INTERCOSTAL NERVE BLOCK Right 09/24/2020   Procedure: INTERCOSTAL NERVE BLOCK;  Surgeon: Melrose Nakayama, MD;  Location: [facility];  Service: Thoracic;  Laterality: Right;  . JOINT REPLACEMENT    . LYMPH NODE DISSECTION Right 09/24/2020   Procedure: LYMPH NODE DISSECTION;  Surgeon: Melrose Nakayama, MD;  Location: [facility];  Service: Thoracic;  Laterality: Right;  . POLYPECTOMY    . right total knee arthroplasty     Dr. Emeterio Reeve 06-04-18  . SPINE SURGERY  08/16/2009  . TOTAL KNEE ARTHROPLASTY Left 02/13/2014    Procedure: TOTAL KNEE ARTHROPLASTY;  Surgeon: Alta Corning, MD;  Location: [facility];  Service: Orthopedics;  Laterality: Left;  . TOTAL KNEE ARTHROPLASTY Right 06/04/2018   Procedure: RIGHT TOTAL KNEE ARTHROPLASTY;  Surgeon: Dorna Leitz, MD;  Location: [facility];  Service: Orthopedics;  Laterality: Right;  Adductor Block  . TUBAL LIGATION      Family Psychiatric History:   Family History:  Family History  Problem Relation Age of Onset  . Cancer Father   . Prostate cancer Father   . Diabetes Sister   . Cancer Sister   . Stroke Maternal Grandfather   . Colitis Maternal Aunt   . Breast cancer Maternal Aunt   . Colon cancer Neg Hx   . Colon polyps Neg Hx   . Rectal cancer Neg Hx   . Stomach cancer Neg Hx   . Esophageal cancer Neg Hx     Social History:   Social History   Socioeconomic History  . Marital status: Married    Spouse name: Not on file  . Number of children: 2  . Years of education: Not on file  . Highest education level: Not on file  Occupational History  . Not on file  Tobacco Use  . Smoking status: Former Smoker    Packs/day: 0.25    Years: 40.00    Pack years: 10.00    Types: Cigarettes    Quit date: 07/09/2020    Years since quitting: 0.3  . Smokeless tobacco: Never Used  . Tobacco comment: PACK WILL LAST 3 days  Vaping Use  . Vaping Use: Never used  Substance and Sexual Activity  . Alcohol use: No  . Drug use: No  . Sexual activity: Not Currently  Other Topics Concern  . Not on file  Social History Narrative  . Not on file   Social Determinants of Health   Financial Resource Strain: Low Risk   . Difficulty of Paying Living Expenses: Not hard at all  Food Insecurity: No Food Insecurity  . Worried About Charity fundraiser in the Last Year: Never true  . Ran Out of Food in the Last Year: Never true  Transportation Needs: No Transportation Needs  . Lack of Transportation (Medical): No  . Lack of Transportation (Non-Medical): No  Physical  Activity: Sufficiently Active  . Days of Exercise per Week: 4 days  . Minutes of Exercise per Session: 50 min  Stress: No Stress Concern Present  . Feeling of Stress : Not at all  Social Connections: Socially Integrated  . Frequency of Communication with Friends and Family: More than three times a week  . Frequency of Social Gatherings with Friends and Family: More than three times a week  . Attends Religious Services: More than 4 times per year  . Active Member of Clubs or Organizations: Yes  .  Attends Archivist Meetings: More than 4 times per year  . Marital Status: Married    Additional Social History:   Allergies:   Allergies  Allergen Reactions  . Amlodipine Swelling and Rash    Rash, swelling  . Chantix [Varenicline Tartrate] Shortness Of Breath, Swelling and Other (See Comments)    Tongue swell,sob  . Clarithromycin Rash  . Lisinopril Hives  . Simvastatin Hives  . Wellbutrin [Bupropion Hcl] Hives  . Lipitor [Atorvastatin]     Dizziness per patient  . Sertraline     Makes her feel like she is going to kill someone    Metabolic Disorder Labs: Lab Results  Component Value Date   HGBA1C 6.6 (H) 09/25/2020   MPG 142.72 09/25/2020   MPG 140 03/14/2020   No results found for: PROLACTIN Lab Results  Component Value Date   CHOL 132 03/14/2020   TRIG 104 03/14/2020   HDL 51 03/14/2020   CHOLHDL 2.6 03/14/2020   VLDL 34.8 09/12/2019   LDLCALC 62 03/14/2020   LDLCALC 63 09/12/2019   Lab Results  Component Value Date   TSH 0.659 11/15/2020    Therapeutic Level Labs: No results found for: LITHIUM No results found for: CBMZ No results found for: VALPROATE  Current Medications: Current Outpatient Medications  Medication Sig Dispense Refill  . acetaminophen (TYLENOL) 500 MG tablet Take 2 tablets (1,000 mg total) by mouth every 6 (six) hours. 30 tablet 0  . ALPRAZolam (XANAX) 0.5 MG tablet Take 1 tablet (0.5 mg total) by mouth 3 (three) times daily as  needed for anxiety. 90 tablet 5  . Ascorbic Acid (VITAMIN C) 1000 MG tablet Take 1,000 mg by mouth daily.    Marland Kitchen aspirin 81 MG tablet Take 1 tablet (81 mg total) by mouth daily. Restart on 08/31/20 30 tablet   . carboxymethylcellulose (REFRESH PLUS) 0.5 % SOLN Place 1-2 drops into both eyes daily.    . folic acid (FOLVITE) 1 MG tablet Take 1 tablet (1 mg total) by mouth daily. 30 tablet 4  . hydrochlorothiazide (HYDRODIURIL) 12.5 MG tablet Take 1 tablet (12.5 mg total) by mouth daily. 90 tablet 1  . ketoconazole (NIZORAL) 2 % cream Apply 1 application topically 2 (two) times daily. 15 g 2  . loratadine (CLARITIN) 10 MG tablet Take 10 mg by mouth daily as needed for allergies.    . mirtazapine (REMERON) 45 MG tablet Take 1 tablet (45 mg total) by mouth at bedtime. 30 tablet 3  . neomycin-bacitracin-polymyxin (NEOSPORIN) ointment Apply 1 application topically as needed for wound care.    Marland Kitchen omeprazole (PRILOSEC) 40 MG capsule Take 1 capsule (40 mg total) by mouth 2 (two) times daily before a meal. 180 capsule 1  . polyethylene glycol (MIRALAX / GLYCOLAX) 17 g packet Take 17 g by mouth 2 (two) times daily. 72 each 0  . pravastatin (PRAVACHOL) 40 MG tablet Take 1 tablet (40 mg total) by mouth every evening. 90 tablet 1  . Probiotic Product (PROBIOTIC DAILY PO) Take 1 capsule by mouth daily.    . prochlorperazine (COMPAZINE) 10 MG tablet Take 1 tablet (10 mg total) by mouth every 6 (six) hours as needed for nausea or vomiting. (Patient not taking: Reported on 11/22/2020) 30 tablet 0  . senna-docusate (SENOKOT-S) 8.6-50 MG tablet Take 1 tablet by mouth 2 (two) times daily. (Patient taking differently: Take 1 tablet by mouth daily as needed for mild constipation.)    . simethicone (MYLICON) 80 MG chewable tablet Chew  1 tablet (80 mg total) by mouth 4 (four) times daily as needed for flatulence (Bloating). 30 tablet 0  . Wheat Dextrin (BENEFIBER) POWD Take 1 Scoop by mouth 2 (two) times daily as needed  (constipation). (Patient not taking: Reported on 11/22/2020)     No current facility-administered medications for this visit.    Musculoskeletal: Strength & Muscle Tone: within normal limits Gait & Station: normal Patient leans: N/A  Psychiatric Specialty Exam: Review of Systems  There were no vitals taken for this visit.There is no height or weight on file to calculate BMI.  General Appearance: Casual  Eye Contact:  Good  Speech:  Clear and Coherent  Volume:  Normal  Mood:  Depressed  Affect:  Appropriate  Thought Process:  Goal Directed  Orientation:  NA  Thought Content:  Logical  Suicidal Thoughts:  No  Homicidal Thoughts:  No  Memory:  NA  Judgement:  Good  Insight:  Good  Psychomotor Activity:  NA and Normal  Concentration:    Recall:  Good  Fund of Knowledge:Good  Language: Good  Akathisia:  No  Handed:  Right  AIMS (if indicated):  not done  Assets:  Desire for Improvement  ADL's:  Intact  Cognition: WNL  Sleep:  Good   Screenings: GAD-7   Flowsheet Row Office Visit from 09/12/2019 in [facility] at [facility]  Total GAD-7 Score 7    [place] [facility] from 12/21/2017 in [facility]  Total Score (max 30 points ) 30    PHQ2-9   Flowsheet Row Chronic Care Management from 10/10/2020 in [facility] from 01/11/2020 in [facility] at [facility] Visit from 09/12/2019 in [facility] from 12/23/2018 in [facility] Visit from 11/19/2018 in [facility]  PHQ-2 Total Score 1 0 2 1 0  PHQ-9 Total Score -- -- 4 -- 0    Flowsheet Row Admission (Discharged) from 09/24/2020 in [facility] from 09/20/2020 in [facility] PREADMISSION TESTING Admission (Discharged) from 08/24/2020 in [facility] No Risk No Risk No Risk      Assessment and Plan:   At this time the patient's 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  I believe there are multiple factors that are affecting this patient's mood state.  This includes having a portion of her left frontal area of her brain removed or having a surgical intervention.  It also includes the fact that she presently is in chemotherapy.  The patient will continue taking Xanax 0.5 mg 3 times daily and today we will increase her Remeron from 30 mg up to 45 mg.  I strongly encouraged the patient to be patient.  The possibility of having some type of supportive psychotherapy should also be considered at her next visit in approximately 2 months.  The patient is not having any real cognitive problems and I suspect getting back to life as best she can is important.  This includes driving short distances with her family.  The patient is functioning reasonably well.  Her chemotherapy should be over within the next week or 2.    Jerral Ralph, MD 6/3/202211:49 AM

## 2020-12-05 ENCOUNTER — Encounter: Payer: Self-pay | Admitting: Internal Medicine

## 2020-12-05 ENCOUNTER — Inpatient Hospital Stay: Payer: Medicare HMO

## 2020-12-05 ENCOUNTER — Inpatient Hospital Stay (HOSPITAL_BASED_OUTPATIENT_CLINIC_OR_DEPARTMENT_OTHER): Payer: Medicare HMO | Admitting: Physician Assistant

## 2020-12-05 ENCOUNTER — Other Ambulatory Visit: Payer: Self-pay

## 2020-12-05 VITALS — BP 144/73 | HR 76 | Temp 98.0°F | Resp 18 | Wt 116.6 lb

## 2020-12-05 DIAGNOSIS — E785 Hyperlipidemia, unspecified: Secondary | ICD-10-CM | POA: Diagnosis not present

## 2020-12-05 DIAGNOSIS — C3411 Malignant neoplasm of upper lobe, right bronchus or lung: Secondary | ICD-10-CM

## 2020-12-05 DIAGNOSIS — Z5111 Encounter for antineoplastic chemotherapy: Secondary | ICD-10-CM

## 2020-12-05 DIAGNOSIS — K219 Gastro-esophageal reflux disease without esophagitis: Secondary | ICD-10-CM | POA: Diagnosis not present

## 2020-12-05 DIAGNOSIS — C7931 Secondary malignant neoplasm of brain: Secondary | ICD-10-CM | POA: Diagnosis not present

## 2020-12-05 DIAGNOSIS — R5383 Other fatigue: Secondary | ICD-10-CM | POA: Diagnosis not present

## 2020-12-05 DIAGNOSIS — Z5112 Encounter for antineoplastic immunotherapy: Secondary | ICD-10-CM | POA: Diagnosis not present

## 2020-12-05 DIAGNOSIS — I10 Essential (primary) hypertension: Secondary | ICD-10-CM | POA: Diagnosis not present

## 2020-12-05 DIAGNOSIS — F419 Anxiety disorder, unspecified: Secondary | ICD-10-CM | POA: Diagnosis not present

## 2020-12-05 LAB — CBC WITH DIFFERENTIAL (CANCER CENTER ONLY)
Abs Immature Granulocytes: 0.04 10*3/uL (ref 0.00–0.07)
Basophils Absolute: 0.1 10*3/uL (ref 0.0–0.1)
Basophils Relative: 1 %
Eosinophils Absolute: 0.1 10*3/uL (ref 0.0–0.5)
Eosinophils Relative: 3 %
HCT: 36.7 % (ref 36.0–46.0)
Hemoglobin: 11.7 g/dL — ABNORMAL LOW (ref 12.0–15.0)
Immature Granulocytes: 1 %
Lymphocytes Relative: 32 %
Lymphs Abs: 1.6 10*3/uL (ref 0.7–4.0)
MCH: 26.5 pg (ref 26.0–34.0)
MCHC: 31.9 g/dL (ref 30.0–36.0)
MCV: 83 fL (ref 80.0–100.0)
Monocytes Absolute: 0.7 10*3/uL (ref 0.1–1.0)
Monocytes Relative: 13 %
Neutro Abs: 2.5 10*3/uL (ref 1.7–7.7)
Neutrophils Relative %: 50 %
Platelet Count: 258 10*3/uL (ref 150–400)
RBC: 4.42 MIL/uL (ref 3.87–5.11)
RDW: 13.6 % (ref 11.5–15.5)
WBC Count: 5 10*3/uL (ref 4.0–10.5)
nRBC: 0 % (ref 0.0–0.2)

## 2020-12-05 LAB — TSH: TSH: 0.827 u[IU]/mL (ref 0.308–3.960)

## 2020-12-05 LAB — CMP (CANCER CENTER ONLY)
ALT: 13 U/L (ref 0–44)
AST: 21 U/L (ref 15–41)
Albumin: 4 g/dL (ref 3.5–5.0)
Alkaline Phosphatase: 79 U/L (ref 38–126)
Anion gap: 14 (ref 5–15)
BUN: 11 mg/dL (ref 8–23)
CO2: 26 mmol/L (ref 22–32)
Calcium: 10.3 mg/dL (ref 8.9–10.3)
Chloride: 102 mmol/L (ref 98–111)
Creatinine: 0.78 mg/dL (ref 0.44–1.00)
GFR, Estimated: >60 mL/min (ref >60)
Glucose, Bld: 94 mg/dL (ref 70–99)
Potassium: 4.2 mmol/L (ref 3.5–5.1)
Sodium: 142 mmol/L (ref 135–145)
Total Bilirubin: 0.2 mg/dL — ABNORMAL LOW (ref 0.3–1.2)
Total Protein: 7.7 g/dL (ref 6.5–8.1)

## 2020-12-05 MED ORDER — PALONOSETRON HCL INJECTION 0.25 MG/5ML
INTRAVENOUS | Status: AC
  Filled 2020-12-05: qty 5

## 2020-12-05 MED ORDER — DEXAMETHASONE SODIUM PHOSPHATE 100 MG/10ML IJ SOLN
10.0000 mg | Freq: Once | INTRAMUSCULAR | Status: AC
  Administered 2020-12-05: 10 mg via INTRAVENOUS
  Filled 2020-12-05: qty 10

## 2020-12-05 MED ORDER — PALONOSETRON HCL INJECTION 0.25 MG/5ML
0.2500 mg | Freq: Once | INTRAVENOUS | Status: AC
  Administered 2020-12-05: 0.25 mg via INTRAVENOUS

## 2020-12-05 MED ORDER — SODIUM CHLORIDE 0.9 % IV SOLN
326.5000 mg | Freq: Once | INTRAVENOUS | Status: AC
  Administered 2020-12-05: 330 mg via INTRAVENOUS
  Filled 2020-12-05: qty 33

## 2020-12-05 MED ORDER — SODIUM CHLORIDE 0.9 % IV SOLN
200.0000 mg | Freq: Once | INTRAVENOUS | Status: AC
  Administered 2020-12-05: 200 mg via INTRAVENOUS
  Filled 2020-12-05: qty 8

## 2020-12-05 MED ORDER — HEPARIN SOD (PORK) LOCK FLUSH 100 UNIT/ML IV SOLN
500.0000 [IU] | Freq: Once | INTRAVENOUS | Status: DC | PRN
  Filled 2020-12-05: qty 5

## 2020-12-05 MED ORDER — SODIUM CHLORIDE 0.9 % IV SOLN
500.0000 mg/m2 | Freq: Once | INTRAVENOUS | Status: AC
  Administered 2020-12-05: 700 mg via INTRAVENOUS
  Filled 2020-12-05: qty 20

## 2020-12-05 MED ORDER — SODIUM CHLORIDE 0.9 % IV SOLN
150.0000 mg | Freq: Once | INTRAVENOUS | Status: AC
  Administered 2020-12-05: 150 mg via INTRAVENOUS
  Filled 2020-12-05: qty 150

## 2020-12-05 MED ORDER — SODIUM CHLORIDE 0.9 % IV SOLN
Freq: Once | INTRAVENOUS | Status: AC
Start: 2020-12-05 — End: 2020-12-05
  Filled 2020-12-05: qty 250

## 2020-12-05 MED ORDER — SODIUM CHLORIDE 0.9% FLUSH
10.0000 mL | INTRAVENOUS | Status: DC | PRN
  Filled 2020-12-05: qty 10

## 2020-12-05 NOTE — Progress Notes (Signed)
Pt is approved for the $1000 [employer] grant.  

## 2020-12-05 NOTE — Patient Instructions (Signed)
[facility]  Discharge Instructions: Thank you for choosing [facility] to provide your oncology and hematology care.   If you have a lab appointment with the [facility], please go directly to the [facility] and check in at the registration area.   Wear comfortable clothing and clothing appropriate for easy access to any Portacath or PICC line.   We strive to give you quality time with your provider. You may need to reschedule your appointment if you arrive late (15 or more minutes).  Arriving late affects you and other patients whose appointments are after yours.  Also, if you miss three or more appointments without notifying the office, you may be dismissed from the clinic at the provider's discretion.      For prescription refill requests, have your pharmacy contact our office and allow 72 hours for refills to be completed.    Today you received the following chemotherapy and/or immunotherapy agents: Keytruda, Alimta, Carboplatin      To help prevent nausea and vomiting after your treatment, we encourage you to take your nausea medication as directed.  BELOW ARE SYMPTOMS THAT SHOULD BE REPORTED IMMEDIATELY: . *FEVER GREATER THAN 100.4 F (38 C) OR HIGHER . *CHILLS OR SWEATING . *NAUSEA AND VOMITING THAT IS NOT CONTROLLED WITH YOUR NAUSEA MEDICATION . *UNUSUAL SHORTNESS OF BREATH . *UNUSUAL BRUISING OR BLEEDING . *URINARY PROBLEMS (pain or burning when urinating, or frequent urination) . *BOWEL PROBLEMS (unusual diarrhea, constipation, pain near the anus) . TENDERNESS IN MOUTH AND THROAT WITH OR WITHOUT PRESENCE OF ULCERS (sore throat, sores in mouth, or a toothache) . UNUSUAL RASH, SWELLING OR PAIN  . UNUSUAL VAGINAL DISCHARGE OR ITCHING   Items with * indicate a potential emergency and should be followed up as soon as possible or go to the Emergency Department if any problems should occur.  Please show the CHEMOTHERAPY ALERT CARD or  IMMUNOTHERAPY ALERT CARD at check-in to the Emergency Department and triage nurse.  Should you have questions after your visit or need to cancel or reschedule your appointment, please contact [facility]  Dept: [phone]  and follow the prompts.  Office hours are 8:00 a.m. to 4:30 p.m. Monday - Friday. Please note that voicemails left after 4:00 p.m. may not be returned until the following business day.  We are closed weekends and major holidays. You have access to a nurse at all times for urgent questions. Please call the main number to the clinic Dept: [phone] and follow the prompts.   For any non-urgent questions, you may also contact your provider using MyChart. We now offer e-Visits for anyone 60 and older to request care online for non-urgent symptoms. For details visit [email or web address].   Also download the MyChart app! Go to the app store, search "MyChart", open the app, select , and log in with your MyChart username and password.  Due to Covid, a mask is required upon entering the hospital/clinic. If you do not have a mask, one will be given to you upon arrival. For doctor visits, patients may have 1 support person aged 72 or older with them. For treatment visits, patients cannot have anyone with them due to current Covid guidelines and our immunocompromised population.

## 2020-12-06 ENCOUNTER — Telehealth: Payer: Medicare HMO

## 2020-12-06 ENCOUNTER — Other Ambulatory Visit: Payer: Medicare HMO

## 2020-12-06 ENCOUNTER — Inpatient Hospital Stay: Payer: Medicare HMO

## 2020-12-10 ENCOUNTER — Encounter: Payer: Self-pay | Admitting: Internal Medicine

## 2020-12-10 ENCOUNTER — Telehealth: Payer: Self-pay | Admitting: Medical Oncology

## 2020-12-10 NOTE — Telephone Encounter (Signed)
Unable to reach dtr.

## 2020-12-12 ENCOUNTER — Other Ambulatory Visit: Payer: Self-pay

## 2020-12-12 ENCOUNTER — Inpatient Hospital Stay: Payer: Medicare HMO

## 2020-12-12 DIAGNOSIS — C3411 Malignant neoplasm of upper lobe, right bronchus or lung: Secondary | ICD-10-CM

## 2020-12-12 DIAGNOSIS — Z5112 Encounter for antineoplastic immunotherapy: Secondary | ICD-10-CM | POA: Diagnosis not present

## 2020-12-12 DIAGNOSIS — Z5111 Encounter for antineoplastic chemotherapy: Secondary | ICD-10-CM | POA: Diagnosis not present

## 2020-12-12 DIAGNOSIS — C7931 Secondary malignant neoplasm of brain: Secondary | ICD-10-CM | POA: Diagnosis not present

## 2020-12-12 DIAGNOSIS — F419 Anxiety disorder, unspecified: Secondary | ICD-10-CM | POA: Diagnosis not present

## 2020-12-12 DIAGNOSIS — E785 Hyperlipidemia, unspecified: Secondary | ICD-10-CM | POA: Diagnosis not present

## 2020-12-12 DIAGNOSIS — R5383 Other fatigue: Secondary | ICD-10-CM | POA: Diagnosis not present

## 2020-12-12 DIAGNOSIS — K219 Gastro-esophageal reflux disease without esophagitis: Secondary | ICD-10-CM | POA: Diagnosis not present

## 2020-12-12 DIAGNOSIS — I10 Essential (primary) hypertension: Secondary | ICD-10-CM | POA: Diagnosis not present

## 2020-12-12 LAB — CBC WITH DIFFERENTIAL (CANCER CENTER ONLY)
Abs Immature Granulocytes: 0.01 10*3/uL (ref 0.00–0.07)
Basophils Absolute: 0 10*3/uL (ref 0.0–0.1)
Basophils Relative: 1 %
Eosinophils Absolute: 0 10*3/uL (ref 0.0–0.5)
Eosinophils Relative: 1 %
HCT: 36.4 % (ref 36.0–46.0)
Hemoglobin: 11.5 g/dL — ABNORMAL LOW (ref 12.0–15.0)
Immature Granulocytes: 0 %
Lymphocytes Relative: 43 %
Lymphs Abs: 1.4 10*3/uL (ref 0.7–4.0)
MCH: 26.4 pg (ref 26.0–34.0)
MCHC: 31.6 g/dL (ref 30.0–36.0)
MCV: 83.5 fL (ref 80.0–100.0)
Monocytes Absolute: 0.2 10*3/uL (ref 0.1–1.0)
Monocytes Relative: 6 %
Neutro Abs: 1.6 10*3/uL — ABNORMAL LOW (ref 1.7–7.7)
Neutrophils Relative %: 49 %
Platelet Count: 170 10*3/uL (ref 150–400)
RBC: 4.36 MIL/uL (ref 3.87–5.11)
RDW: 13.1 % (ref 11.5–15.5)
WBC Count: 3.2 10*3/uL — ABNORMAL LOW (ref 4.0–10.5)
nRBC: 0 % (ref 0.0–0.2)

## 2020-12-12 LAB — CMP (CANCER CENTER ONLY)
ALT: 18 U/L (ref 0–44)
AST: 21 U/L (ref 15–41)
Albumin: 4.1 g/dL (ref 3.5–5.0)
Alkaline Phosphatase: 80 U/L (ref 38–126)
Anion gap: 11 (ref 5–15)
BUN: 19 mg/dL (ref 8–23)
CO2: 27 mmol/L (ref 22–32)
Calcium: 10.4 mg/dL — ABNORMAL HIGH (ref 8.9–10.3)
Chloride: 100 mmol/L (ref 98–111)
Creatinine: 0.78 mg/dL (ref 0.44–1.00)
GFR, Estimated: >60 mL/min (ref >60)
Glucose, Bld: 111 mg/dL — ABNORMAL HIGH (ref 70–99)
Potassium: 4.4 mmol/L (ref 3.5–5.1)
Sodium: 138 mmol/L (ref 135–145)
Total Bilirubin: 0.4 mg/dL (ref 0.3–1.2)
Total Protein: 7.8 g/dL (ref 6.5–8.1)

## 2020-12-13 ENCOUNTER — Other Ambulatory Visit: Payer: Medicare HMO

## 2020-12-13 ENCOUNTER — Inpatient Hospital Stay: Payer: Medicare HMO

## 2020-12-19 ENCOUNTER — Other Ambulatory Visit: Payer: Self-pay | Admitting: *Deleted

## 2020-12-20 ENCOUNTER — Other Ambulatory Visit: Payer: Medicare HMO

## 2020-12-20 ENCOUNTER — Ambulatory Visit: Payer: Medicare HMO

## 2020-12-20 ENCOUNTER — Other Ambulatory Visit: Payer: Self-pay

## 2020-12-20 ENCOUNTER — Inpatient Hospital Stay: Payer: Medicare HMO

## 2020-12-20 ENCOUNTER — Ambulatory Visit: Payer: Medicare HMO | Admitting: Internal Medicine

## 2020-12-20 DIAGNOSIS — F419 Anxiety disorder, unspecified: Secondary | ICD-10-CM | POA: Diagnosis not present

## 2020-12-20 DIAGNOSIS — E785 Hyperlipidemia, unspecified: Secondary | ICD-10-CM | POA: Diagnosis not present

## 2020-12-20 DIAGNOSIS — R5383 Other fatigue: Secondary | ICD-10-CM | POA: Diagnosis not present

## 2020-12-20 DIAGNOSIS — Z5111 Encounter for antineoplastic chemotherapy: Secondary | ICD-10-CM | POA: Diagnosis not present

## 2020-12-20 DIAGNOSIS — K219 Gastro-esophageal reflux disease without esophagitis: Secondary | ICD-10-CM | POA: Diagnosis not present

## 2020-12-20 DIAGNOSIS — I10 Essential (primary) hypertension: Secondary | ICD-10-CM | POA: Diagnosis not present

## 2020-12-20 DIAGNOSIS — C3411 Malignant neoplasm of upper lobe, right bronchus or lung: Secondary | ICD-10-CM | POA: Diagnosis not present

## 2020-12-20 DIAGNOSIS — C7931 Secondary malignant neoplasm of brain: Secondary | ICD-10-CM | POA: Diagnosis not present

## 2020-12-20 DIAGNOSIS — Z5112 Encounter for antineoplastic immunotherapy: Secondary | ICD-10-CM | POA: Diagnosis not present

## 2020-12-20 LAB — CMP (CANCER CENTER ONLY)
ALT: 14 U/L (ref 0–44)
AST: 19 U/L (ref 15–41)
Albumin: 4 g/dL (ref 3.5–5.0)
Alkaline Phosphatase: 86 U/L (ref 38–126)
Anion gap: 12 (ref 5–15)
BUN: 15 mg/dL (ref 8–23)
CO2: 28 mmol/L (ref 22–32)
Calcium: 10 mg/dL (ref 8.9–10.3)
Chloride: 101 mmol/L (ref 98–111)
Creatinine: 0.73 mg/dL (ref 0.44–1.00)
GFR, Estimated: >60 mL/min (ref >60)
Glucose, Bld: 84 mg/dL (ref 70–99)
Potassium: 4 mmol/L (ref 3.5–5.1)
Sodium: 141 mmol/L (ref 135–145)
Total Bilirubin: <0.2 mg/dL — ABNORMAL LOW (ref 0.3–1.2)
Total Protein: 7.4 g/dL (ref 6.5–8.1)

## 2020-12-20 LAB — CBC WITH DIFFERENTIAL (CANCER CENTER ONLY)
Abs Immature Granulocytes: 0.02 10*3/uL (ref 0.00–0.07)
Basophils Absolute: 0 10*3/uL (ref 0.0–0.1)
Basophils Relative: 0 %
Eosinophils Absolute: 0.1 10*3/uL (ref 0.0–0.5)
Eosinophils Relative: 1 %
HCT: 34.9 % — ABNORMAL LOW (ref 36.0–46.0)
Hemoglobin: 11.1 g/dL — ABNORMAL LOW (ref 12.0–15.0)
Immature Granulocytes: 1 %
Lymphocytes Relative: 44 %
Lymphs Abs: 1.9 10*3/uL (ref 0.7–4.0)
MCH: 26.6 pg (ref 26.0–34.0)
MCHC: 31.8 g/dL (ref 30.0–36.0)
MCV: 83.5 fL (ref 80.0–100.0)
Monocytes Absolute: 0.7 10*3/uL (ref 0.1–1.0)
Monocytes Relative: 16 %
Neutro Abs: 1.6 10*3/uL — ABNORMAL LOW (ref 1.7–7.7)
Neutrophils Relative %: 38 %
Platelet Count: 214 10*3/uL (ref 150–400)
RBC: 4.18 MIL/uL (ref 3.87–5.11)
RDW: 13.5 % (ref 11.5–15.5)
WBC Count: 4.2 10*3/uL (ref 4.0–10.5)
nRBC: 0 % (ref 0.0–0.2)

## 2020-12-24 ENCOUNTER — Encounter: Payer: Self-pay | Admitting: Internal Medicine

## 2020-12-26 ENCOUNTER — Inpatient Hospital Stay (HOSPITAL_BASED_OUTPATIENT_CLINIC_OR_DEPARTMENT_OTHER): Payer: Medicare HMO | Admitting: Internal Medicine

## 2020-12-26 ENCOUNTER — Other Ambulatory Visit: Payer: Self-pay

## 2020-12-26 ENCOUNTER — Inpatient Hospital Stay: Payer: Medicare HMO

## 2020-12-26 ENCOUNTER — Other Ambulatory Visit: Payer: Self-pay | Admitting: Internal Medicine

## 2020-12-26 ENCOUNTER — Encounter: Payer: Self-pay | Admitting: Internal Medicine

## 2020-12-26 VITALS — BP 135/75 | HR 70 | Temp 97.9°F | Resp 18 | Ht 60.0 in | Wt 113.5 lb

## 2020-12-26 DIAGNOSIS — C3490 Malignant neoplasm of unspecified part of unspecified bronchus or lung: Secondary | ICD-10-CM | POA: Diagnosis not present

## 2020-12-26 DIAGNOSIS — R5383 Other fatigue: Secondary | ICD-10-CM | POA: Diagnosis not present

## 2020-12-26 DIAGNOSIS — Z5112 Encounter for antineoplastic immunotherapy: Secondary | ICD-10-CM

## 2020-12-26 DIAGNOSIS — E785 Hyperlipidemia, unspecified: Secondary | ICD-10-CM | POA: Diagnosis not present

## 2020-12-26 DIAGNOSIS — F419 Anxiety disorder, unspecified: Secondary | ICD-10-CM | POA: Diagnosis not present

## 2020-12-26 DIAGNOSIS — Z5111 Encounter for antineoplastic chemotherapy: Secondary | ICD-10-CM

## 2020-12-26 DIAGNOSIS — C7931 Secondary malignant neoplasm of brain: Secondary | ICD-10-CM | POA: Diagnosis not present

## 2020-12-26 DIAGNOSIS — C3411 Malignant neoplasm of upper lobe, right bronchus or lung: Secondary | ICD-10-CM

## 2020-12-26 DIAGNOSIS — I10 Essential (primary) hypertension: Secondary | ICD-10-CM | POA: Diagnosis not present

## 2020-12-26 DIAGNOSIS — K219 Gastro-esophageal reflux disease without esophagitis: Secondary | ICD-10-CM | POA: Diagnosis not present

## 2020-12-26 LAB — CMP (CANCER CENTER ONLY)
ALT: 14 U/L (ref 0–44)
AST: 22 U/L (ref 15–41)
Albumin: 4.1 g/dL (ref 3.5–5.0)
Alkaline Phosphatase: 84 U/L (ref 38–126)
Anion gap: 13 (ref 5–15)
BUN: 12 mg/dL (ref 8–23)
CO2: 26 mmol/L (ref 22–32)
Calcium: 10.2 mg/dL (ref 8.9–10.3)
Chloride: 100 mmol/L (ref 98–111)
Creatinine: 0.72 mg/dL (ref 0.44–1.00)
GFR, Estimated: >60 mL/min (ref >60)
Glucose, Bld: 86 mg/dL (ref 70–99)
Potassium: 4 mmol/L (ref 3.5–5.1)
Sodium: 139 mmol/L (ref 135–145)
Total Bilirubin: 0.3 mg/dL (ref 0.3–1.2)
Total Protein: 7.7 g/dL (ref 6.5–8.1)

## 2020-12-26 LAB — CBC WITH DIFFERENTIAL (CANCER CENTER ONLY)
Abs Immature Granulocytes: 0.01 10*3/uL (ref 0.00–0.07)
Basophils Absolute: 0 10*3/uL (ref 0.0–0.1)
Basophils Relative: 1 %
Eosinophils Absolute: 0 10*3/uL (ref 0.0–0.5)
Eosinophils Relative: 1 %
HCT: 35.8 % — ABNORMAL LOW (ref 36.0–46.0)
Hemoglobin: 11.4 g/dL — ABNORMAL LOW (ref 12.0–15.0)
Immature Granulocytes: 0 %
Lymphocytes Relative: 41 %
Lymphs Abs: 1.7 10*3/uL (ref 0.7–4.0)
MCH: 26.5 pg (ref 26.0–34.0)
MCHC: 31.8 g/dL (ref 30.0–36.0)
MCV: 83.1 fL (ref 80.0–100.0)
Monocytes Absolute: 0.5 10*3/uL (ref 0.1–1.0)
Monocytes Relative: 12 %
Neutro Abs: 1.9 10*3/uL (ref 1.7–7.7)
Neutrophils Relative %: 45 %
Platelet Count: 358 10*3/uL (ref 150–400)
RBC: 4.31 MIL/uL (ref 3.87–5.11)
RDW: 13.7 % (ref 11.5–15.5)
WBC Count: 4.1 10*3/uL (ref 4.0–10.5)
nRBC: 0 % (ref 0.0–0.2)

## 2020-12-26 LAB — TSH: TSH: 0.666 u[IU]/mL (ref 0.308–3.960)

## 2020-12-26 MED ORDER — SODIUM CHLORIDE 0.9 % IV SOLN
10.0000 mg | Freq: Once | INTRAVENOUS | Status: AC
  Administered 2020-12-26: 10 mg via INTRAVENOUS
  Filled 2020-12-26: qty 10

## 2020-12-26 MED ORDER — SODIUM CHLORIDE 0.9 % IV SOLN
200.0000 mg | Freq: Once | INTRAVENOUS | Status: AC
  Administered 2020-12-26: 200 mg via INTRAVENOUS
  Filled 2020-12-26: qty 8

## 2020-12-26 MED ORDER — PALONOSETRON HCL INJECTION 0.25 MG/5ML
INTRAVENOUS | Status: AC
  Filled 2020-12-26: qty 5

## 2020-12-26 MED ORDER — CYANOCOBALAMIN 1000 MCG/ML IJ SOLN
1000.0000 ug | Freq: Once | INTRAMUSCULAR | Status: AC
  Administered 2020-12-26: 1000 ug via INTRAMUSCULAR

## 2020-12-26 MED ORDER — SODIUM CHLORIDE 0.9 % IV SOLN
326.5000 mg | Freq: Once | INTRAVENOUS | Status: AC
  Administered 2020-12-26: 330 mg via INTRAVENOUS
  Filled 2020-12-26: qty 33

## 2020-12-26 MED ORDER — CYANOCOBALAMIN 1000 MCG/ML IJ SOLN
INTRAMUSCULAR | Status: AC
  Filled 2020-12-26: qty 1

## 2020-12-26 MED ORDER — SODIUM CHLORIDE 0.9 % IV SOLN
Freq: Once | INTRAVENOUS | Status: AC
  Filled 2020-12-26: qty 250

## 2020-12-26 MED ORDER — SODIUM CHLORIDE 0.9 % IV SOLN
500.0000 mg/m2 | Freq: Once | INTRAVENOUS | Status: AC
  Administered 2020-12-26: 700 mg via INTRAVENOUS
  Filled 2020-12-26: qty 20

## 2020-12-26 MED ORDER — SODIUM CHLORIDE 0.9 % IV SOLN
150.0000 mg | Freq: Once | INTRAVENOUS | Status: AC
  Administered 2020-12-26: 150 mg via INTRAVENOUS
  Filled 2020-12-26: qty 150

## 2020-12-26 MED ORDER — PALONOSETRON HCL INJECTION 0.25 MG/5ML
0.2500 mg | Freq: Once | INTRAVENOUS | Status: AC
  Administered 2020-12-26: 0.25 mg via INTRAVENOUS

## 2020-12-26 NOTE — Progress Notes (Signed)
Called Patient to do meaningful use .Daughter  states that all her mothers medication was updated today  when they had their appointment in  medical oncology.

## 2020-12-26 NOTE — Progress Notes (Signed)
[facility] [phone]   [phone]  OFFICE PROGRESS NOTE  [name], MD [place] [address]  DIAGNOSIS: Stage IV non-small cell lung cancer (T, N0, M1C) adenocarcinoma.  The patient presented with a and solitary brain metastasis.  The patient was diagnosed in February 2022.  Biomarker Findings Microsatellite status - MS-Stable Tumor Mutational Burden - 8 Muts/Mb Genomic Findings For a complete list of the genes assayed, please refer to the Appendix. KRAS G12V KEAP1 S224F TP53 P151T 7 Disease relevant genes with no reportable alterations: ALK, BRAF, EGFR, ERBB2, MET, RET, ROS1  PDL1 Expression: 50%    PRIOR THERAPY: 1) SRS to the metastatic brain lesion on 08/23/2020 under the care of Dr. Tammi Klippel and craniotomy under the care of Dr. Zada Finders scheduled for 08/24/2020. 2) S/p robotic assisted right upper lobectomy with en bloc wedge resection of the right middle lobe and lymph node dissection under the care of Dr. Roxan Hockey on September 24, 2020. 3) status post SRS to a new subcentimeter brain lesions under the care of Dr. Lisbeth Renshaw.   CURRENT THERAPY: Systemic chemotherapy with carboplatin for AUC of 5, Alimta 500 Mg/M2 and Keytruda 200 mg IV every 3 weeks.  First dose Nov 07, 2020.  Status post 2 cycles   INTERVAL HISTORY: Hannah [name] 70 y.o. female returns to the clinic today for follow-up visit accompanied by her daughter.  The patient is feeling fine today with no concerning complaints except for mild fatigue.  She has been tolerating her treatment with systemic chemotherapy fairly well.  She denied having any current chest pain, shortness of breath, cough or hemoptysis.  She denied having any fever or chills.  She has no nausea, vomiting, diarrhea or constipation.  She is here today for evaluation before starting cycle #3.   MEDICAL HISTORY: Past Medical History:  Diagnosis Date   Allergy    Anemia    Anxiety     Arthritis    Carpal tunnel syndrome    Constipation    senna C stool softeners help    Depression    Diverticulosis    Dyslipidemia    External hemorrhoids    GERD (gastroesophageal reflux disease)    Heart murmur    mild-moderate AR   Hiatal hernia    Hyperlipidemia    on meds    Hypertension    Internal hemorrhoids    Osteoarthritis    Pre-diabetes    PVD (peripheral vascular disease) (HCC)    moderate carotid disease   RBBB    Smoker    Vocal cord polyps     ALLERGIES:  is allergic to amlodipine, chantix [varenicline tartrate], clarithromycin, lisinopril, simvastatin, wellbutrin [bupropion hcl], lipitor [atorvastatin], and sertraline.  MEDICATIONS:  Current Outpatient Medications  Medication Sig Dispense Refill   acetaminophen (TYLENOL) 500 MG tablet Take 2 tablets (1,000 mg total) by mouth every 6 (six) hours. 30 tablet 0   ALPRAZolam (XANAX) 0.5 MG tablet Take 1 tablet (0.5 mg total) by mouth 3 (three) times daily as needed for anxiety. 90 tablet 5   Ascorbic Acid (VITAMIN C) 1000 MG tablet Take 1,000 mg by mouth daily.     aspirin 81 MG tablet Take 1 tablet (81 mg total) by mouth daily. Restart on 08/31/20 30 tablet    carboxymethylcellulose (REFRESH PLUS) 0.5 % SOLN Place 1-2 drops into both eyes daily.     folic acid (FOLVITE) 1 MG tablet Take 1 tablet (1  mg total) by mouth daily. 30 tablet 4   hydrochlorothiazide (HYDRODIURIL) 12.5 MG tablet Take 1 tablet (12.5 mg total) by mouth daily. 90 tablet 1   ketoconazole (NIZORAL) 2 % cream Apply 1 application topically 2 (two) times daily. 15 g 2   loratadine (CLARITIN) 10 MG tablet Take 10 mg by mouth daily as needed for allergies.     mirtazapine (REMERON) 45 MG tablet Take 1 tablet (45 mg total) by mouth at bedtime. 30 tablet 3   neomycin-bacitracin-polymyxin (NEOSPORIN) ointment Apply 1 application topically as needed for wound care.     omeprazole (PRILOSEC) 40 MG capsule Take 1 capsule (40 mg total) by mouth 2 (two)  times daily before a meal. 180 capsule 1   polyethylene glycol (MIRALAX / GLYCOLAX) 17 g packet Take 17 g by mouth 2 (two) times daily. 72 each 0   pravastatin (PRAVACHOL) 40 MG tablet Take 1 tablet (40 mg total) by mouth every evening. 90 tablet 1   Probiotic Product (PROBIOTIC DAILY PO) Take 1 capsule by mouth daily.     prochlorperazine (COMPAZINE) 10 MG tablet Take 1 tablet (10 mg total) by mouth every 6 (six) hours as needed for nausea or vomiting. (Patient not taking: Reported on 11/22/2020) 30 tablet 0   senna-docusate (SENOKOT-S) 8.6-50 MG tablet Take 1 tablet by mouth 2 (two) times daily. (Patient taking differently: Take 1 tablet by mouth daily as needed for mild constipation.)     simethicone (MYLICON) 80 MG chewable tablet Chew 1 tablet (80 mg total) by mouth 4 (four) times daily as needed for flatulence (Bloating). 30 tablet 0   Wheat Dextrin (BENEFIBER) POWD Take 1 Scoop by mouth 2 (two) times daily as needed (constipation). (Patient not taking: Reported on 11/22/2020)     No current facility-administered medications for this visit.    SURGICAL HISTORY:  Past Surgical History:  Procedure Laterality Date   ABDOMINAL HYSTERECTOMY  4010   APPLICATION OF CRANIAL NAVIGATION N/A 08/24/2020   Procedure: APPLICATION OF CRANIAL NAVIGATION;  Surgeon: Judith Part, MD;  Location: [facility];  Service: Neurosurgery;  Laterality: N/A;   COLONOSCOPY     COLONOSCOPY W/ POLYPECTOMY  2009   CRANIOTOMY Left 08/24/2020   Procedure: Left Craniotomy for Tumor Resection with Brainlab;  Surgeon: Judith Part, MD;  Location: [facility];  Service: Neurosurgery;  Laterality: Left;   HEMORRHOID SURGERY     INTERCOSTAL NERVE BLOCK Right 09/24/2020   Procedure: INTERCOSTAL NERVE BLOCK;  Surgeon: Melrose Nakayama, MD;  Location: [facility];  Service: Thoracic;  Laterality: Right;   JOINT REPLACEMENT     LYMPH NODE DISSECTION Right 09/24/2020   Procedure: LYMPH NODE DISSECTION;  Surgeon: Melrose Nakayama, MD;  Location: [facility];  Service: Thoracic;  Laterality: Right;   POLYPECTOMY     right total knee arthroplasty     Dr. Emeterio Reeve 06-04-18   [facility]  08/16/2009   TOTAL KNEE ARTHROPLASTY Left 02/13/2014   Procedure: TOTAL KNEE ARTHROPLASTY;  Surgeon: Alta Corning, MD;  Location: [facility];  Service: Orthopedics;  Laterality: Left;   TOTAL KNEE ARTHROPLASTY Right 06/04/2018   Procedure: RIGHT TOTAL KNEE ARTHROPLASTY;  Surgeon: Dorna Leitz, MD;  Location: [facility];  Service: Orthopedics;  Laterality: Right;  Adductor Block   TUBAL LIGATION      REVIEW OF SYSTEMS:  A comprehensive review of systems was negative except for: Constitutional: positive for fatigue   PHYSICAL EXAMINATION: General appearance: alert, cooperative, fatigued, and no distress Head:  Normocephalic, without obvious abnormality, atraumatic Neck: no adenopathy, no JVD, supple, symmetrical, trachea midline, and thyroid not enlarged, symmetric, no tenderness/mass/nodules Lymph nodes: Cervical, supraclavicular, and axillary nodes normal. Resp: clear to auscultation bilaterally Back: symmetric, no curvature. ROM normal. No CVA tenderness. Cardio: regular rate and rhythm, S1, S2 normal, no murmur, click, rub or gallop GI: soft, non-tender; bowel sounds normal; no masses,  no organomegaly Extremities: extremities normal, atraumatic, no cyanosis or edema  ECOG PERFORMANCE STATUS: 1 - Symptomatic but completely ambulatory  Blood pressure 135/75, pulse 70, temperature 97.9 F (36.6 C), temperature source Tympanic, resp. rate 18, height 5' (1.524 m), weight 113 lb 8 oz (51.5 kg), SpO2 100 %.  LABORATORY DATA: Lab Results  Component Value Date   WBC 4.1 12/26/2020   HGB 11.4 (L) 12/26/2020   HCT 35.8 (L) 12/26/2020   MCV 83.1 12/26/2020   PLT 358 12/26/2020      Chemistry      Component Value Date/Time   NA 141 12/20/2020 1600   K 4.0 12/20/2020 1600   CL 101 12/20/2020 1600   CO2 28 12/20/2020 1600   BUN 15 12/20/2020  1600   CREATININE 0.73 12/20/2020 1600   CREATININE 0.79 03/14/2020 1016      Component Value Date/Time   CALCIUM 10.0 12/20/2020 1600   ALKPHOS 86 12/20/2020 1600   AST 19 12/20/2020 1600   ALT 14 12/20/2020 1600   BILITOT <0.2 (L) 12/20/2020 1600       RADIOGRAPHIC STUDIES: No results found.   ASSESSMENT AND PLAN: This is a very pleasant 70 years old African-American female diagnosed with a stage IV (T3, N0, M1b) non-small cell lung cancer, adenocarcinoma presented with right upper lobe lung mass with solitary brain metastasis diagnosed in February 2022 status post SRS to the brain lesion followed by craniotomy and resection. The patient also has a solitary lung mass. S/p robotic assisted right upper lobectomy with en bloc wedge resection of the right middle lobe and lymph node dissection under the care of Dr. Roxan Hockey on September 24, 2020. She also underwent SRS treatment to a new subcentimeter brain lesions under the care of Dr. Lisbeth Renshaw She is currently undergoing systemic chemotherapy with carboplatin for AUC of 5, Alimta 500 Mg/M2 and Keytruda 200 Mg IV every 3 weeks status post 2 cycles. The patient has been tolerating this treatment well with no concerning adverse effect except for mild fatigue. I recommended for her to proceed with cycle #3 today as planned. I will see her back for follow-up visit in 3 weeks for evaluation with repeat CT scan of the chest, abdomen pelvis for restaging of her disease. The patient was advised to call immediately if she has any other concerning symptoms in the interval. The patient voices understanding of current disease status and treatment options and is in agreement with the current care plan.  All questions were answered. The patient knows to call the clinic with any problems, questions or concerns. We can certainly see the patient much sooner if necessary.  The total time spent in the appointment was 40 minutes.  Disclaimer: This note was  dictated with voice recognition software. Similar sounding words can inadvertently be transcribed and may not be corrected upon review.

## 2020-12-26 NOTE — Patient Instructions (Signed)
[facility]  Discharge Instructions: Thank you for choosing [facility] to provide your oncology and hematology care.   If you have a lab appointment with the [facility], please go directly to the [facility] and check in at the registration area.   Wear comfortable clothing and clothing appropriate for easy access to any Portacath or PICC line.   We strive to give you quality time with your provider. You may need to reschedule your appointment if you arrive late (15 or more minutes).  Arriving late affects you and other patients whose appointments are after yours.  Also, if you miss three or more appointments without notifying the office, you may be dismissed from the clinic at the provider's discretion.      For prescription refill requests, have your pharmacy contact our office and allow 72 hours for refills to be completed.    Today you received the following chemotherapy and/or immunotherapy agents: Keytruda, Alimta, Carboplatin      To help prevent nausea and vomiting after your treatment, we encourage you to take your nausea medication as directed.  BELOW ARE SYMPTOMS THAT SHOULD BE REPORTED IMMEDIATELY: *FEVER GREATER THAN 100.4 F (38 C) OR HIGHER *CHILLS OR SWEATING *NAUSEA AND VOMITING THAT IS NOT CONTROLLED WITH YOUR NAUSEA MEDICATION *UNUSUAL SHORTNESS OF BREATH *UNUSUAL BRUISING OR BLEEDING *URINARY PROBLEMS (pain or burning when urinating, or frequent urination) *BOWEL PROBLEMS (unusual diarrhea, constipation, pain near the anus) TENDERNESS IN MOUTH AND THROAT WITH OR WITHOUT PRESENCE OF ULCERS (sore throat, sores in mouth, or a toothache) UNUSUAL RASH, SWELLING OR PAIN  UNUSUAL VAGINAL DISCHARGE OR ITCHING   Items with * indicate a potential emergency and should be followed up as soon as possible or go to the Emergency Department if any problems should occur.  Please show the CHEMOTHERAPY ALERT CARD or IMMUNOTHERAPY ALERT  CARD at check-in to the Emergency Department and triage nurse.  Should you have questions after your visit or need to cancel or reschedule your appointment, please contact [facility]  Dept: [phone]  and follow the prompts.  Office hours are 8:00 a.m. to 4:30 p.m. Monday - Friday. Please note that voicemails left after 4:00 p.m. may not be returned until the following business day.  We are closed weekends and major holidays. You have access to a nurse at all times for urgent questions. Please call the main number to the clinic Dept: [phone] and follow the prompts.   For any non-urgent questions, you may also contact your provider using MyChart. We now offer e-Visits for anyone 68 and older to request care online for non-urgent symptoms. For details visit [email or web address].   Also download the MyChart app! Go to the app store, search "MyChart", open the app, select [facility], and log in with your MyChart username and password.  Due to Covid, a mask is required upon entering the hospital/clinic. If you do not have a mask, one will be given to you upon arrival. For doctor visits, patients may have 1 support person aged 5 or older with them. For treatment visits, patients cannot have anyone with them due to current Covid guidelines and our immunocompromised population.

## 2020-12-27 ENCOUNTER — Ambulatory Visit (INDEPENDENT_AMBULATORY_CARE_PROVIDER_SITE_OTHER): Payer: Medicare HMO | Admitting: *Deleted

## 2020-12-27 ENCOUNTER — Other Ambulatory Visit: Payer: Medicare HMO

## 2020-12-27 DIAGNOSIS — I10 Essential (primary) hypertension: Secondary | ICD-10-CM

## 2020-12-27 DIAGNOSIS — C3411 Malignant neoplasm of upper lobe, right bronchus or lung: Secondary | ICD-10-CM | POA: Diagnosis not present

## 2020-12-27 NOTE — Progress Notes (Signed)
Radiation Oncology         [phone] ________________________________  Name: Hannah [name] MRN: [id number]  Date: 12/28/2020  DOB: [birth year]  Post Treatment Note  CC: [name], MD  [name], MD  Diagnosis:   70 yo woman with brain metastasis from Stage IV non-small cell lung cancer (T3, N0, M1C) adenocarcinoma of the right upper lung.   Interval Since Last Radiation:  1 month 11/29/20//salvage SRS: The following brain targets were treated to a prescription dose of 20 Gy in a single fraction:    08/23/20//pre-op SRS:  PTV1: The Left frontal 2.5 cm target was treated to a prescription dose of 18 Gy in a single fraction (Pre-op).   Narrative:  I spoke with the patient and her daughter, [name], to conduct her routine scheduled 1 month follow up visit via telephone to spare the patient unnecessary potential exposure in the healthcare setting during the current COVID-19 pandemic.   She tolerated the recent salvage SRS treatment very well without any ill side effects and is currently without complaints.    In brief summary, she initially presented to her PCP in late Jan. 2022 with complaints of several days of rapidly progressive speech impairment and right sided weakness. She and her family described difficulty putting complete sentences together, impaired use of the right arm, and dragging of the right leg while walking.  Her PCP, Dr. Quay Burow ordered imaging, initially with a noncontrast brain MRI performed on 07/31/2020 which showed a 1.5 cm left frontal lobe lesion with prominent surrounding edema and mass-effect with a 3 mm rightward midline shift.   Brain MRI with contrast was performed for further evaluation on 08/02/20 and demonstrated a lobulated left frontal lesion with necrotic center and irregular peripheral contrast enhancement measuring approximately 2.2 x 1.8 x 1.4 cm, with surrounding edema and 3 to 4 mm rightward midline shift.  No additional brain lesions were noted.    She was started on Decadron at 76m twice per day and did notice improvement with regards to the right sided weakness and speech. She met with Dr. VMickeal Skinneron 08/06/20 and the Decadron dose was lowered to 442mdaily, starting on 08/08/20, which she tolerated well.   A CT C/A/P was obtained on 08/02/20 to assess for a potential primary source of the apparent brain metastasis since she had no prior history of cancer to her knowledge.  The chest imaging revealed an irregular, solid 5.4 x 4.4 cm peripheral right upper lobe lung mass, consistent with bronchogenic carcinoma.  There were no enlarged mediastinal nodes or other evidence of metastatic cancer in the chest, abdomen or pelvis.  A PET scan on 08/08/20 confirmed the hypermetabolic mass in the right upper lobe but no other sites of active disease.     We met with the patient and her daughter, ReLidia Collumon 08/09/20 to discuss radiation options for management of the brain disease. Her case was discussed in multidisciplinary brain conference and the consensus recommendation was to proceed with pre-op SRS followed by surgical resection which she was in agreement with. She had a single fraction of SRS to the solitary met in the left frontal lobe on 08/23/20 which was tolerated well and then proceeded to surgical resection, under the care of Dr. OsZada Findersn 08/24/20.  She did well with surgery and was able to go home the following day. Final surgical pathology showed a poorly differentiated carcinoma consistent with a lung primary.  She met with Dr. HeRoxan Hockeyn 09/04/20 to  discuss robotic assisted thorascopy-right upper lobectomy with en bloc wedge resection of the right middle lobe and node resection which was performed on 09/24/20. 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  She met back with Dr. Julien Nordmann on 10/23/20 and the recommendation was to proceed with  adjuvant systemic chemotherapy with immunotherapy which she started 11/07/20 with carboplatin, Alimta and Keytruda 200 mg IV every 3 weeks and has continued tolerating well.        On her follow up/surveillance MRI brain on 11/16/20 there were two new subcentimeter metastases in the bilateral cerebrum with a 65m right low parietal lesion and a 4 mm left parietal lesion. Evolving left frontal treatment site without findings worrisome for residual disease.  These images were reviewed in the multidisciplinary brain conference on 11/19/20 and consensus recommendation was to proceed with a single fraction of SRS to treat the two new lesions. She and her daughter, RLidia Collumwere in agreement to proceed and the SAndrewstreatment was performed on 11/29/20 and tolerated very well.  On review of systems, the patient states that she is doing well in general. She has continued struggling with some increased anxiety/depression related to her new diagnosis and is having occasional, intermittent headaches that are mild and resolve with Tylenol/Advil prn. She denies any changes in visual or auditory acuity, progressive focal weakness, paraesthesias, difficulty with speech, imbalance, tremor or seizure activity. She denies any fever, chills, night sweats, or weight loss. She denies any increased shortness of breath, cough, or chest pain. She sometimes has chest discomfort over her right ribs at the site of her prior surgery but this is mild and not progressive. She has seen some intermittent scant hemoptysis over the past week which she mentioned to Dr. MJulien Nordmannat her recent follow up visit and is scheduled for repeat imaging in the near future for further evaluation. She she denies any nausea, vomiting, diarrhea, or constipation and she is maintaining her weight. Her energy is improving and overall, she is pleased with her progress.  ALLERGIES:  is allergic to amlodipine, chantix [varenicline tartrate], clarithromycin, lisinopril,  simvastatin, wellbutrin [bupropion hcl], lipitor [atorvastatin], and sertraline.  Meds: Current Outpatient Medications  Medication Sig Dispense Refill   acetaminophen (TYLENOL) 500 MG tablet Take 2 tablets (1,000 mg total) by mouth every 6 (six) hours. 30 tablet 0   ALPRAZolam (XANAX) 0.5 MG tablet Take 1 tablet (0.5 mg total) by mouth 3 (three) times daily as needed for anxiety. 90 tablet 5   Ascorbic Acid (VITAMIN C) 1000 MG tablet Take 1,000 mg by mouth daily.     aspirin 81 MG tablet Take 1 tablet (81 mg total) by mouth daily. Restart on 08/31/20 30 tablet    carboxymethylcellulose (REFRESH PLUS) 0.5 % SOLN Place 1-2 drops into both eyes daily.     folic acid (FOLVITE) 1 MG tablet Take 1 tablet (1 mg total) by mouth daily. 30 tablet 4   hydrochlorothiazide (HYDRODIURIL) 12.5 MG tablet Take 1 tablet (12.5 mg total) by mouth daily. 90 tablet 1   ketoconazole (NIZORAL) 2 % cream Apply 1 application topically 2 (two) times daily. 15 g 2   loratadine (CLARITIN) 10 MG tablet Take 10 mg by mouth daily as needed for allergies.     mirtazapine (REMERON) 45 MG tablet Take 1 tablet (45 mg total) by mouth at bedtime. 30 tablet 3   neomycin-bacitracin-polymyxin (NEOSPORIN) ointment Apply 1 application  topically as needed for wound care.     omeprazole (PRILOSEC) 40 MG capsule Take 1 capsule (40 mg total) by mouth 2 (two) times daily before a meal. 180 capsule 1   polyethylene glycol (MIRALAX / GLYCOLAX) 17 g packet Take 17 g by mouth 2 (two) times daily. 72 each 0   pravastatin (PRAVACHOL) 40 MG tablet Take 1 tablet (40 mg total) by mouth every evening. 90 tablet 1   Probiotic Product (PROBIOTIC DAILY PO) Take 1 capsule by mouth daily.     prochlorperazine (COMPAZINE) 10 MG tablet Take 1 tablet (10 mg total) by mouth every 6 (six) hours as needed for nausea or vomiting. (Patient not taking: Reported on 11/22/2020) 30 tablet 0   senna-docusate (SENOKOT-S) 8.6-50 MG tablet Take 1 tablet by mouth 2 (two) times  daily. (Patient taking differently: Take 1 tablet by mouth daily as needed for mild constipation.)     simethicone (MYLICON) 80 MG chewable tablet Chew 1 tablet (80 mg total) by mouth 4 (four) times daily as needed for flatulence (Bloating). 30 tablet 0   Wheat Dextrin (BENEFIBER) POWD Take 1 Scoop by mouth 2 (two) times daily as needed (constipation). (Patient not taking: Reported on 11/22/2020)     No current facility-administered medications for this encounter.    Physical Findings:  vitals were not taken for this visit.   /Unable to assess due to telephone follow up visit format.  Lab Findings: Lab Results  Component Value Date   WBC 4.1 12/26/2020   HGB 11.4 (L) 12/26/2020   HCT 35.8 (L) 12/26/2020   MCV 83.1 12/26/2020   PLT 358 12/26/2020     Radiographic Findings: No results found.   Impression/Plan: 58.  70 yo woman with brain metastasis from Stage IV non-small cell lung cancer (T3, N0, M1C) adenocarcinoma of the right upper lung.   She appears to have recovered well from the effects of her recent [facility] and is currently without any neurologic complaints. She has continued on combination chemo-immunotherapy with carboplatin, Alimta and Keytruda 200 mg IV every 3 weeks and so far is tolerating well. The current plan is to continue this regimen and she will have repeat restaging systemic imaging in 12/2020, prior to her scheduled follow up with Dr. Julien Nordmann on 01/17/21.  We discussed the plan to obtain a follow-up MRI brain scan as scheduled on 02/18/21 to assess her response to treatment and pending this scan is stable, we will resume serial MRI brain scan every 3 months to continue to monitor for any disease recurrence or progression.  She and her daughter, [name], appear to have a good understanding of these recommendations and are comfortable and in agreement with the stated plan.  She will follow up with Dr. Mickeal Skinner following each scan to review results and recommendations from the  multidisciplinary brain conference and we will see her back on an as needed basis if there is any need for further radiotherapy in the future.  They know that they are welcome to call at anytime in interim with any questions or concerns related to previous radiotherapy.    Nicholos Johns, PA-C

## 2020-12-27 NOTE — Chronic Care Management (AMB) (Signed)
Chronic Care Management   CCM RN Visit Note  12/27/2020 Name: Hannah [name] MRN: [id number] DOB: May 08, 1951  Subjective: Hannah [name] is a 70 y.o. year old female who is a primary care patient of Burns, Claudina Lick, MD. The care management team was consulted for assistance with disease management and care coordination needs.    Engaged with patient and her daughter/ caregiver [name] by telephone for follow up visit in response to provider referral for case management and/or care coordination services.   Consent to Services:  The patient was given information about Chronic Care Management services, agreed to services, and gave verbal consent prior to initiation of services.  Please see initial visit note for detailed documentation.  Patient agreed to services and verbal consent obtained.   Assessment: Review of patient past medical history, allergies, medications, health status, including review of consultants reports, laboratory and other test data, was performed as part of comprehensive evaluation and provision of chronic care management services.    CCM Care Plan  Allergies  Allergen Reactions   Amlodipine Swelling and Rash    Rash, swelling   Chantix [Varenicline Tartrate] Shortness Of Breath, Swelling and Other (See Comments)    Tongue swell,sob   Clarithromycin Rash   Lisinopril Hives   Simvastatin Hives   Wellbutrin [Bupropion Hcl] Hives   Lipitor [Atorvastatin]     Dizziness per patient   Sertraline     Makes her feel like she is going to kill someone    Outpatient Encounter Medications as of 12/27/2020  Medication Sig Note   acetaminophen (TYLENOL) 500 MG tablet Take 2 tablets (1,000 mg total) by mouth every 6 (six) hours.    ALPRAZolam (XANAX) 0.5 MG tablet Take 1 tablet (0.5 mg total) by mouth 3 (three) times daily as needed for anxiety.    Ascorbic Acid (VITAMIN C) 1000 MG tablet Take 1,000 mg by mouth daily.    aspirin 81 MG tablet Take 1 tablet (81 mg total) by  mouth daily. Restart on 08/31/20    carboxymethylcellulose (REFRESH PLUS) 0.5 % SOLN Place 1-2 drops into both eyes daily.    folic acid (FOLVITE) 1 MG tablet Take 1 tablet (1 mg total) by mouth daily.    hydrochlorothiazide (HYDRODIURIL) 12.5 MG tablet Take 1 tablet (12.5 mg total) by mouth daily.    ketoconazole (NIZORAL) 2 % cream Apply 1 application topically 2 (two) times daily.    loratadine (CLARITIN) 10 MG tablet Take 10 mg by mouth daily as needed for allergies.    mirtazapine (REMERON) 45 MG tablet Take 1 tablet (45 mg total) by mouth at bedtime.    neomycin-bacitracin-polymyxin (NEOSPORIN) ointment Apply 1 application topically as needed for wound care.    omeprazole (PRILOSEC) 40 MG capsule Take 1 capsule (40 mg total) by mouth 2 (two) times daily before a meal.    polyethylene glycol (MIRALAX / GLYCOLAX) 17 g packet Take 17 g by mouth 2 (two) times daily.    pravastatin (PRAVACHOL) 40 MG tablet Take 1 tablet (40 mg total) by mouth every evening.    Probiotic Product (PROBIOTIC DAILY PO) Take 1 capsule by mouth daily.    prochlorperazine (COMPAZINE) 10 MG tablet Take 1 tablet (10 mg total) by mouth every 6 (six) hours as needed for nausea or vomiting. (Patient not taking: Reported on 11/22/2020) 10/30/2020: 10/30/20: reports has not needed recently   senna-docusate (SENOKOT-S) 8.6-50 MG tablet Take 1 tablet by mouth 2 (two) times daily. (Patient taking differently: Take 1  tablet by mouth daily as needed for mild constipation.)    simethicone (MYLICON) 80 MG chewable tablet Chew 1 tablet (80 mg total) by mouth 4 (four) times daily as needed for flatulence (Bloating).    Wheat Dextrin (BENEFIBER) POWD Take 1 Scoop by mouth 2 (two) times daily as needed (constipation). (Patient not taking: Reported on 11/22/2020) 10/30/2020: 10/30/20: patient reports has not needed recently    No facility-administered encounter medications on file as of 12/27/2020.    Patient Active Problem List   Diagnosis Date  Noted   Encounter for antineoplastic chemotherapy 10/23/2020   Encounter for antineoplastic immunotherapy 10/23/2020   Anemia 10/19/2020   Sleep difficulties 10/19/2020   S/P robot-assisted surgical procedure 09/24/2020   Aortic atherosclerosis ([place]) 09/12/2020   Lung mass 09/04/2020   Status post craniotomy 08/24/2020   Brain tumor ([place]) 08/24/2020   Hematochezia 08/19/2020   Acute blood loss anemia    Rectal bleeding 08/17/2020   Primary cancer of right upper lobe of lung ([place]) 08/07/2020   Brain metastasis ([place]) 08/06/2020   Memory changes 07/24/2020   Non-recurrent unilateral inguinal hernia without obstruction or gangrene 07/24/2020   Pain of right clavicle 09/12/2019   Nonspecific abnormal electrocardiogram (ECG) (EKG) 10/20/2018   Educated about COVID-19 virus infection 10/20/2018   Weight loss 07/17/2018   Depression 07/17/2018   Primary osteoarthritis of right knee 06/04/2018   Bilateral carotid artery stenosis 09/24/2017   Aortic valve sclerosis 09/24/2017   Vocal cord polyps 09/07/2017   Dysphagia 09/07/2017   Diabetes mellitus without complication ([place]) 62/22/9798   GERD (gastroesophageal reflux disease) 02/27/2016   Anxiety 02/27/2016   S/P total knee replacement 02/13/2014   RBBB 02/08/2014   HTN (hypertension) 02/08/2014   PVD - bilateral 60-79% carotid strenosis 02/08/2014   Mixed hyperlipidemia 11/29/2013   Carpal tunnel syndrome, bilateral 11/23/2013   Murmur- mild -mod AR, mild MR 11/10/2013   Constipation 12/16/2012   Conditions to be addressed/monitored:  HTN and cancer  Care Plan : Cancer Posttreatment Phase (Adult)  Updates made by Knox Royalty, RN since 12/27/2020 12:00 AM     Problem: Pain   Priority: Medium     Long-Range Goal: Pain and emotions managed   Start Date: 10/10/2020  Expected End Date: 12/27/2021  This Visit's Progress: On track  Recent Progress: On track  Priority: Medium  Note:   Current Barriers:  Recent surgery x 2 for  cancer treatment- ongoing recuperation/ pain  Clinical Goal(s):  Goal re-established and extended 12/27/20: Over the next 12 months, as evidenced by patient and caregiver reporting during CCM RN CM outreach, patient will: work with care management team and care providers to address care coordination and chronic disease management needs related to diagnosis and treatment plan of cancer with metastases to brain and pain control/ emotional lability after 2 recent surgeries Interventions:  Evaluation of current treatment plan related to cancer treatment, post-op course, pain control, emotional health self-management and patient's adherence to plan as established by provider. Collaboration with [name], MD regarding development and update of comprehensive plan of care as evidenced by provider attestation       and co-signature Inter-disciplinary care team collaboration (see longitudinal plan of care) Chart reviewed including relevant office notes, upcoming scheduled appointments, and lab results Reviewed with patient and caregiver patient's recent provider office visits: she has attended all as scheduled and is particularly happy that she is now established with Dr. Casimiro Needle psychiatry Discussed current clinical condition with patient and caregiver: patient  sounds like a different person this phone call: joy radiates from her voice and she reports feeling "so much better and happier," after having become established with Dr. Casimiro Needle: confirmed that patient has made recommended medication adjustments post-psychiatry provider office visit; confirmed patient's appetite and sleep hygiene has greatly improved; states "I have not had any crying spells in a long time." Reviewed/ discussed recent provider visits: oncology- radiation/ chemotherapy/ provider office visits; thoracic surgeon: reports all have gone "great;" reports tolerating chemotherapy "fine;" denies pain management concerns  Reviewed upcoming  provider appointments with patient and caregiver and confirmed that patient has plans to attend all as scheduled:  rotating chemotherapy appointments, labs; psychiatry 01/23/21; MRI 02/18/21; oncology 02/21/21; PCP, annual physical exam 03/20/21 Discussed plans with patient for ongoing care management follow up and provided patient with direct contact information for care management team Self Care Activities:  Patient verbalizes understanding of plan to continue keeping care providers updated on her progress/ and to call for any questions or concerns Attends all scheduled provider appointments Calls pharmacy for medication refills Performs ADL's and iADL's independently with minimal assistance as indicated from family members Patient Goals: Lesieli, I am so glad that you are feeling better and doing well since your medications were adjusted by Dr. Casimiro Needle Please continue to follow up with Dr. Casimiro Needle regularly Doristine Devoid job managing your medications at home; continue to call for prescription refill 3 days before needed I am glad your pain levels have improved- if you have changes in your pain levels, please call your care providers Keep up the great work attending all of your appointments  Keep up the great work staying active and maintaining your social interactions with your family Track what makes the pain and emotional distress worse and what makes it better Follow Up Plan:  Telephone follow up appointment with care management team member scheduled for: March 05, 2021 at 2:00 pm The patient has been provided with contact information for the care management team and has been advised to call with any health related questions or concerns.      Care Plan : Hypertension (Adult)  Updates made by Knox Royalty, RN since 12/27/2020 12:00 AM     Problem: Hypertension (Hypertension)   Priority: Medium     Long-Range Goal: Hypertension Monitored   Start Date: 10/10/2020  Expected End Date: 04/12/2021   This Visit's Progress: On track  Recent Progress: On track  Priority: Medium  Note:   Objective:  Last practice recorded BP readings:  BP Readings from Last 3 Encounters:  10/09/20 117/70  09/28/20 131/69  09/20/20 112/78  Most recent eGFR/CrCl: No results found for: EGFR  No components found for: CRCL Current Barriers:  Knowledge Deficits related to basic understanding of hypertension pathophysiology and self care management- patient/ caregiver could benefit form ongoing reinforcement of HTN management/ plan of care Recent surgeries for new diagnosis of cancer with metastases to brain; ongoing recuperation with physical and emotional aspects of dealing with new diagnosis while managing chronic conditions Case Manager Clinical Goal(s):  Over the next 6 months, patient will: Demonstrate ongoing adherence to prescribed treatment plan for hypertension in setting of new cancer diagnosis/ treatment plan, as evidenced by taking all medications as prescribed, monitoring and recording blood pressure as directed, adhering to low sodium/DASH diet Demonstrate improved health management independence as evidenced by checking blood pressure as directed and notifying care providers for concerns and /or questions Interventions:  Collaboration with [name], MD regarding development and update of  comprehensive plan of care as evidenced by provider attestation and co-signature Inter-disciplinary care team collaboration (see longitudinal plan of care) Chart reviewed including relevant office notes, upcoming scheduled appointments, and lab results Re-confirmed caregiver continues monitoring blood pressures at home in between provider appointments and chemotherapy appointments: reviewed with patient and caregiver recent blood pressures: no concerns identified; positive reinforcement provided- encouraged to continue monitoring blood pressures at home Confirmed patient continues to follow heart healthy, low  sodium diet; she reports gaining a little weight since last outreach- reports better appetite, weight today 113 lbs Confirmed patient continues to take medications as prescribed and confirmed no recent medication changes (aside from adjustments made by psychiatry provider), confirmed no medication concerns today Self-Care Activities: Self administers medications as prescribed- receives minimal assistance from caregivers with fillinig of weekly pill box and reminding to take when due Attends all scheduled provider appointments Calls provider office for new concerns, questions, or BP outside discussed parameters Checks BP and records as discussed Follows a low sodium diet/DASH diet Patient Goals: Doristine Devoid job keeping up with your blood pressure regularly- keep up the great work: glad to hear that recent blood pressures at home have been stable! Continue checking blood pressure 2- 3 times per week Write blood pressure results in a log or diary- we will review your blood pressures during our follow up calls Continue taking your medications as prescribed Continue following a low salt heart healthy diet- I am glad you have seen an improvement in your appetite and have gained more weight Great job increasing your activity; pace activity with periods of rest and don't over-do activity; take care to rest and hydrate during hot weather Contact your care providers/ doctors if you have changes or concerns about your blood pressure readings at home Follow Up Plan:  Telephone follow up appointment with care management team member scheduled for: March 05, 2021 at 2:00 pm The patient has been provided with contact information for the care management team and has been advised to call with any health related questions or concerns.      Plan: Telephone follow up appointment with care management team member scheduled for:  Tuesday March 05, 2021 at 2:00 pm The patient and her daughter have been provided with  contact information for the care management team and has been advised to call with any health related questions or concerns.   [name], RN, BSN, [facility] RN Care Coordination- [place] [phone]: direct office [phone]: mobile

## 2020-12-27 NOTE — Patient Instructions (Signed)
Visit Information  Lucindia and [name], it was nice talking with you today.   I look forward to talking to you again for an update on Tuesday March 05, 2021 at 2:00 pm- please be listening out for my call that day.  I will call as close to 2:00 pm as possible; I look forward to hearing about your progress.   Please don't hesitate to contact me if I can be of assistance to you before our next scheduled appointment.   [name], RN, BSN, [facility] RN Care Coordination- [place] [phone]: direct office [phone]: mobile     PATIENT GOALS:  Goals Addressed             This Visit's Progress    Keep Pain and emotions Under Control-Cancer   On track    Timeframe:  Long-Range Goal Priority:  Medium Start Date:           10/10/20                  Expected End Date:      12/27/21- goal re-established and extended today, 12/19/20                 Follow Up Date 03/05/21   Jaquia, I am so glad that you are feeling better and doing well since your medications were adjusted by Dr. Casimiro Needle Please continue to follow up with Dr. Casimiro Needle regularly Doristine Devoid job managing your medications at home; continue to call for prescription refill 3 days before needed I am glad your pain levels have improved- if you have changes in your pain levels, please call your care providers Keep up the great work attending all of your appointments  Keep up the great work staying active and maintaining your social interactions with your family Track what makes the pain and emotional distress worse and what makes it better  Why is this important?   Day-to-day life can be hard when you have pain.  Even a small change in emotion or a physical problem can make pain better or worse.  Coping with pain depends on how the mind and body react to pain.  Pain medicine is just one piece of the treatment puzzle.  There are many tools to help manage pain. A combination of them can be used to  best meet your needs.           Track and Manage My Blood Pressure-Hypertension   On track    Timeframe:  Long-Range Goal Priority:  Medium Start Date:       10/10/20                      Expected End Date:   04/12/21                    Follow Up Date 03/05/21   Doristine Devoid job keeping up with your blood pressure regularly- keep up the great work: glad to hear that recent blood pressures at home have been stable! Continue checking blood pressure 2- 3 times per week Write blood pressure results in a log or diary- we will review your blood pressures during our follow up calls Continue taking your medications as prescribed Continue following a low salt heart healthy diet- I am glad you have seen an improvement in your appetite and have gained more weight Great job increasing your activity; pace activity with periods of rest and don't over-do activity;  take care to rest and hydrate during hot weather Contact your care providers/ doctors if you have changes or concerns about your blood pressure readings at home   Why is this important?   You won't feel high blood pressure, but it can still hurt your blood vessels.  High blood pressure can cause heart or kidney problems. It can also cause a stroke.  Making lifestyle changes like losing a little weight or eating less salt will help.  Checking your blood pressure at home and at different times of the day can help to control blood pressure.  If the doctor prescribes medicine remember to take it the way the doctor ordered.  Call the office if you cannot afford the medicine or if there are questions about it.             Patient/ caregiver- daughter verbalizes understanding of instructions provided today and agrees to view in [place].  Telephone follow up appointment with care management team member scheduled for: Tuesday March 05, 2021 at 2:00 pm The patient/ caregiver- daughter have been provided with contact information for the care  management team and has been advised to call with any health related questions or concerns.  [name], RN, BSN, [facility] RN Care Coordination- [place] [phone]: direct office [phone]: mobile

## 2020-12-28 ENCOUNTER — Ambulatory Visit
Admission: RE | Admit: 2020-12-28 | Discharge: 2020-12-28 | Disposition: A | Discharge status: Home / Self Care | Payer: Medicare HMO | Source: Ambulatory Visit | Attending: Urology | Admitting: Urology

## 2020-12-28 DIAGNOSIS — C7931 Secondary malignant neoplasm of brain: Secondary | ICD-10-CM

## 2021-01-03 ENCOUNTER — Other Ambulatory Visit: Payer: Medicare HMO

## 2021-01-03 ENCOUNTER — Other Ambulatory Visit: Payer: Self-pay

## 2021-01-03 ENCOUNTER — Inpatient Hospital Stay: Payer: Medicare HMO | Attending: Internal Medicine

## 2021-01-03 DIAGNOSIS — Z87891 Personal history of nicotine dependence: Secondary | ICD-10-CM | POA: Diagnosis not present

## 2021-01-03 DIAGNOSIS — I10 Essential (primary) hypertension: Secondary | ICD-10-CM | POA: Diagnosis not present

## 2021-01-03 DIAGNOSIS — J439 Emphysema, unspecified: Secondary | ICD-10-CM | POA: Diagnosis not present

## 2021-01-03 DIAGNOSIS — Z5112 Encounter for antineoplastic immunotherapy: Secondary | ICD-10-CM | POA: Diagnosis not present

## 2021-01-03 DIAGNOSIS — R002 Palpitations: Secondary | ICD-10-CM | POA: Diagnosis not present

## 2021-01-03 DIAGNOSIS — C3411 Malignant neoplasm of upper lobe, right bronchus or lung: Secondary | ICD-10-CM

## 2021-01-03 DIAGNOSIS — J90 Pleural effusion, not elsewhere classified: Secondary | ICD-10-CM | POA: Diagnosis not present

## 2021-01-03 DIAGNOSIS — F419 Anxiety disorder, unspecified: Secondary | ICD-10-CM | POA: Diagnosis not present

## 2021-01-03 DIAGNOSIS — E785 Hyperlipidemia, unspecified: Secondary | ICD-10-CM | POA: Diagnosis not present

## 2021-01-03 DIAGNOSIS — Z79899 Other long term (current) drug therapy: Secondary | ICD-10-CM | POA: Diagnosis not present

## 2021-01-03 DIAGNOSIS — Z5111 Encounter for antineoplastic chemotherapy: Secondary | ICD-10-CM | POA: Insufficient documentation

## 2021-01-03 DIAGNOSIS — F32A Depression, unspecified: Secondary | ICD-10-CM | POA: Diagnosis not present

## 2021-01-03 DIAGNOSIS — Z8719 Personal history of other diseases of the digestive system: Secondary | ICD-10-CM | POA: Insufficient documentation

## 2021-01-03 DIAGNOSIS — C7931 Secondary malignant neoplasm of brain: Secondary | ICD-10-CM | POA: Insufficient documentation

## 2021-01-03 DIAGNOSIS — I700 Atherosclerosis of aorta: Secondary | ICD-10-CM | POA: Insufficient documentation

## 2021-01-03 DIAGNOSIS — K219 Gastro-esophageal reflux disease without esophagitis: Secondary | ICD-10-CM | POA: Diagnosis not present

## 2021-01-03 DIAGNOSIS — Z888 Allergy status to other drugs, medicaments and biological substances status: Secondary | ICD-10-CM | POA: Diagnosis not present

## 2021-01-03 DIAGNOSIS — R61 Generalized hyperhidrosis: Secondary | ICD-10-CM | POA: Diagnosis not present

## 2021-01-03 LAB — CMP (CANCER CENTER ONLY)
ALT: 15 U/L (ref 0–44)
AST: 21 U/L (ref 15–41)
Albumin: 3.8 g/dL (ref 3.5–5.0)
Alkaline Phosphatase: 85 U/L (ref 38–126)
Anion gap: 11 (ref 5–15)
BUN: 18 mg/dL (ref 8–23)
CO2: 29 mmol/L (ref 22–32)
Calcium: 10 mg/dL (ref 8.9–10.3)
Chloride: 98 mmol/L (ref 98–111)
Creatinine: 0.87 mg/dL (ref 0.44–1.00)
GFR, Estimated: >60 mL/min (ref >60)
Glucose, Bld: 122 mg/dL — ABNORMAL HIGH (ref 70–99)
Potassium: 3.8 mmol/L (ref 3.5–5.1)
Sodium: 138 mmol/L (ref 135–145)
Total Bilirubin: <0.2 mg/dL — ABNORMAL LOW (ref 0.3–1.2)
Total Protein: 7.3 g/dL (ref 6.5–8.1)

## 2021-01-03 LAB — CBC WITH DIFFERENTIAL (CANCER CENTER ONLY)
Abs Immature Granulocytes: 0.01 10*3/uL (ref 0.00–0.07)
Basophils Absolute: 0 10*3/uL (ref 0.0–0.1)
Basophils Relative: 1 %
Eosinophils Absolute: 0 10*3/uL (ref 0.0–0.5)
Eosinophils Relative: 1 %
HCT: 33 % — ABNORMAL LOW (ref 36.0–46.0)
Hemoglobin: 10.8 g/dL — ABNORMAL LOW (ref 12.0–15.0)
Immature Granulocytes: 0 %
Lymphocytes Relative: 49 %
Lymphs Abs: 2.1 10*3/uL (ref 0.7–4.0)
MCH: 26.9 pg (ref 26.0–34.0)
MCHC: 32.7 g/dL (ref 30.0–36.0)
MCV: 82.3 fL (ref 80.0–100.0)
Monocytes Absolute: 0.5 10*3/uL (ref 0.1–1.0)
Monocytes Relative: 13 %
Neutro Abs: 1.5 10*3/uL — ABNORMAL LOW (ref 1.7–7.7)
Neutrophils Relative %: 36 %
Platelet Count: 182 10*3/uL (ref 150–400)
RBC: 4.01 MIL/uL (ref 3.87–5.11)
RDW: 13.2 % (ref 11.5–15.5)
WBC Count: 4.2 10*3/uL (ref 4.0–10.5)
nRBC: 0 % (ref 0.0–0.2)

## 2021-01-10 ENCOUNTER — Inpatient Hospital Stay: Payer: Medicare HMO

## 2021-01-10 ENCOUNTER — Other Ambulatory Visit: Payer: Medicare HMO

## 2021-01-10 ENCOUNTER — Other Ambulatory Visit: Payer: Self-pay

## 2021-01-10 ENCOUNTER — Ambulatory Visit: Payer: Medicare HMO | Admitting: Internal Medicine

## 2021-01-10 ENCOUNTER — Telehealth (INDEPENDENT_AMBULATORY_CARE_PROVIDER_SITE_OTHER): Payer: Medicare HMO | Admitting: Family Medicine

## 2021-01-10 ENCOUNTER — Encounter: Payer: Self-pay | Admitting: Family Medicine

## 2021-01-10 ENCOUNTER — Ambulatory Visit: Payer: Medicare HMO

## 2021-01-10 DIAGNOSIS — R067 Sneezing: Secondary | ICD-10-CM | POA: Diagnosis not present

## 2021-01-10 DIAGNOSIS — C3411 Malignant neoplasm of upper lobe, right bronchus or lung: Secondary | ICD-10-CM | POA: Diagnosis not present

## 2021-01-10 DIAGNOSIS — I10 Essential (primary) hypertension: Secondary | ICD-10-CM | POA: Diagnosis not present

## 2021-01-10 DIAGNOSIS — F419 Anxiety disorder, unspecified: Secondary | ICD-10-CM | POA: Diagnosis not present

## 2021-01-10 DIAGNOSIS — R0981 Nasal congestion: Secondary | ICD-10-CM | POA: Diagnosis not present

## 2021-01-10 DIAGNOSIS — C7931 Secondary malignant neoplasm of brain: Secondary | ICD-10-CM | POA: Diagnosis not present

## 2021-01-10 DIAGNOSIS — J439 Emphysema, unspecified: Secondary | ICD-10-CM | POA: Diagnosis not present

## 2021-01-10 DIAGNOSIS — R61 Generalized hyperhidrosis: Secondary | ICD-10-CM | POA: Diagnosis not present

## 2021-01-10 DIAGNOSIS — Z5111 Encounter for antineoplastic chemotherapy: Secondary | ICD-10-CM | POA: Diagnosis not present

## 2021-01-10 DIAGNOSIS — Z5112 Encounter for antineoplastic immunotherapy: Secondary | ICD-10-CM | POA: Diagnosis not present

## 2021-01-10 DIAGNOSIS — E785 Hyperlipidemia, unspecified: Secondary | ICD-10-CM | POA: Diagnosis not present

## 2021-01-10 LAB — CBC WITH DIFFERENTIAL (CANCER CENTER ONLY)
Abs Immature Granulocytes: 0.02 10*3/uL (ref 0.00–0.07)
Basophils Absolute: 0 10*3/uL (ref 0.0–0.1)
Basophils Relative: 0 %
Eosinophils Absolute: 0.1 10*3/uL (ref 0.0–0.5)
Eosinophils Relative: 1 %
HCT: 35.4 % — ABNORMAL LOW (ref 36.0–46.0)
Hemoglobin: 11.3 g/dL — ABNORMAL LOW (ref 12.0–15.0)
Immature Granulocytes: 0 %
Lymphocytes Relative: 37 %
Lymphs Abs: 1.9 10*3/uL (ref 0.7–4.0)
MCH: 26.9 pg (ref 26.0–34.0)
MCHC: 31.9 g/dL (ref 30.0–36.0)
MCV: 84.3 fL (ref 80.0–100.0)
Monocytes Absolute: 0.7 10*3/uL (ref 0.1–1.0)
Monocytes Relative: 14 %
Neutro Abs: 2.5 10*3/uL (ref 1.7–7.7)
Neutrophils Relative %: 48 %
Platelet Count: 172 10*3/uL (ref 150–400)
RBC: 4.2 MIL/uL (ref 3.87–5.11)
RDW: 14 % (ref 11.5–15.5)
WBC Count: 5.2 10*3/uL (ref 4.0–10.5)
nRBC: 0 % (ref 0.0–0.2)

## 2021-01-10 LAB — CMP (CANCER CENTER ONLY)
ALT: 13 U/L (ref 0–44)
AST: 22 U/L (ref 15–41)
Albumin: 3.9 g/dL (ref 3.5–5.0)
Alkaline Phosphatase: 97 U/L (ref 38–126)
Anion gap: 8 (ref 5–15)
BUN: 11 mg/dL (ref 8–23)
CO2: 31 mmol/L (ref 22–32)
Calcium: 10.4 mg/dL — ABNORMAL HIGH (ref 8.9–10.3)
Chloride: 101 mmol/L (ref 98–111)
Creatinine: 0.77 mg/dL (ref 0.44–1.00)
GFR, Estimated: >60 mL/min (ref >60)
Glucose, Bld: 120 mg/dL — ABNORMAL HIGH (ref 70–99)
Potassium: 3.9 mmol/L (ref 3.5–5.1)
Sodium: 140 mmol/L (ref 135–145)
Total Bilirubin: <0.2 mg/dL — ABNORMAL LOW (ref 0.3–1.2)
Total Protein: 7.7 g/dL (ref 6.5–8.1)

## 2021-01-10 NOTE — Patient Instructions (Signed)
-  stop the claritin and start zyrtec OR allegra once daily  -start nasal saline twice daily  -try flonase 2 sprays each nostril daily for 2 weeks  -schedule follow up with your primary care doctor in about 2 weeks  I hope you are feeling better soon!  Seek in person care promptly sooner if your symptoms worsen, new concerns arise or you are not improving with treatment.  It was nice to meet you today. I help [place] out with telemedicine visits on Tuesdays and Thursdays and am available for visits on those days. If you have any concerns or questions following this visit please schedule a follow up visit with your Primary Care doctor or seek care at a local urgent care clinic to avoid delays in care.

## 2021-01-10 NOTE — Progress Notes (Signed)
Virtual Visit via Video Note  I connected with Hannah [name]  on 01/10/21 at  6:00 PM EDT by a video enabled telemedicine application and verified that I am speaking with the correct person using two identifiers.  Location patient: home, [place] Location provider:work or home office Persons participating in the virtual visit: patient, provider, daughter  I discussed the limitations of evaluation and management by telemedicine and the availability of in person appointments. The patient expressed understanding and agreed to proceed.   HPI:  Acute telemedicine visit for sinus issues: -Onset: 2 weeks -Symptoms include: clear nasal congestion, sneezing, eyes itchy and watery -Denies:fevers, CP, SOB, NVD, sinus pain, headaches, inability to eat/drink/get out of bed -Has tried:claritin -Pertinent past medical history: allergy -Pertinent medication allergies:  Allergies  Allergen Reactions   Amlodipine Swelling and Rash    Rash, swelling   Chantix [Varenicline Tartrate] Shortness Of Breath, Swelling and Other (See Comments)    Tongue swell,sob   Clarithromycin Rash   Lisinopril Hives   Simvastatin Hives   Wellbutrin [Bupropion Hcl] Hives   Lipitor [Atorvastatin]     Dizziness per patient   Sertraline     Makes her feel like she is going to kill someone  -COVID-19 vaccine status: vaccinated x 2 and had 2 boosters  ROS: See pertinent positives and negatives per HPI.  Past Medical History:  Diagnosis Date   Allergy    Anemia    Anxiety    Arthritis    Carpal tunnel syndrome    Constipation    senna C stool softeners help    Depression    Diverticulosis    Dyslipidemia    External hemorrhoids    GERD (gastroesophageal reflux disease)    Heart murmur    mild-moderate AR   Hiatal hernia    Hyperlipidemia    on meds    Hypertension    Internal hemorrhoids    Osteoarthritis    Pre-diabetes    PVD (peripheral vascular disease) (HCC)    moderate carotid disease   RBBB    Smoker     Vocal cord polyps     Past Surgical History:  Procedure Laterality Date   ABDOMINAL HYSTERECTOMY  4081   APPLICATION OF CRANIAL NAVIGATION N/A 08/24/2020   Procedure: APPLICATION OF CRANIAL NAVIGATION;  Surgeon: Judith Part, MD;  Location: [facility] [facility];  Service: Neurosurgery;  Laterality: N/A;   COLONOSCOPY     COLONOSCOPY W/ POLYPECTOMY  2009   CRANIOTOMY Left 08/24/2020   Procedure: Left Craniotomy for Tumor Resection with Brainlab;  Surgeon: Judith Part, MD;  Location: [place];  Service: Neurosurgery;  Laterality: Left;   HEMORRHOID SURGERY     INTERCOSTAL NERVE BLOCK Right 09/24/2020   Procedure: INTERCOSTAL NERVE BLOCK;  Surgeon: Melrose Nakayama, MD;  Location: [place];  Service: Thoracic;  Laterality: Right;   JOINT REPLACEMENT     LYMPH NODE DISSECTION Right 09/24/2020   Procedure: LYMPH NODE DISSECTION;  Surgeon: Melrose Nakayama, MD;  Location: [place];  Service: Thoracic;  Laterality: Right;   POLYPECTOMY     right total knee arthroplasty     Dr. Emeterio Reeve 06-04-18   [place]  08/16/2009   TOTAL KNEE ARTHROPLASTY Left 02/13/2014   Procedure: TOTAL KNEE ARTHROPLASTY;  Surgeon: Alta Corning, MD;  Location: [facility];  Service: Orthopedics;  Laterality: Left;   TOTAL KNEE ARTHROPLASTY Right 06/04/2018   Procedure: RIGHT TOTAL KNEE ARTHROPLASTY;  Surgeon: Dorna Leitz, MD;  Location: [facility];  Service: Orthopedics;  Laterality: Right;  Adductor Block   TUBAL LIGATION       Current Outpatient Medications:    acetaminophen (TYLENOL) 500 MG tablet, Take 2 tablets (1,000 mg total) by mouth every 6 (six) hours., Disp: 30 tablet, Rfl: 0   ALPRAZolam (XANAX) 0.5 MG tablet, Take 1 tablet (0.5 mg total) by mouth 3 (three) times daily as needed for anxiety., Disp: 90 tablet, Rfl: 5   Ascorbic Acid (VITAMIN C) 1000 MG tablet, Take 1,000 mg by mouth daily., Disp: , Rfl:    aspirin 81 MG tablet, Take 1 tablet (81 mg total) by mouth daily. Restart on 08/31/20, Disp: 30 tablet, Rfl:     carboxymethylcellulose (REFRESH PLUS) 0.5 % SOLN, Place 1-2 drops into both eyes daily., Disp: , Rfl:    folic acid (FOLVITE) 1 MG tablet, Take 1 tablet (1 mg total) by mouth daily., Disp: 30 tablet, Rfl: 4   hydrochlorothiazide (HYDRODIURIL) 12.5 MG tablet, Take 1 tablet (12.5 mg total) by mouth daily., Disp: 90 tablet, Rfl: 1   ketoconazole (NIZORAL) 2 % cream, Apply 1 application topically 2 (two) times daily., Disp: 15 g, Rfl: 2   loratadine (CLARITIN) 10 MG tablet, Take 10 mg by mouth daily as needed for allergies., Disp: , Rfl:    mirtazapine (REMERON) 45 MG tablet, Take 1 tablet (45 mg total) by mouth at bedtime., Disp: 30 tablet, Rfl: 3   neomycin-bacitracin-polymyxin (NEOSPORIN) ointment, Apply 1 application topically as needed for wound care., Disp: , Rfl:    omeprazole (PRILOSEC) 40 MG capsule, Take 1 capsule (40 mg total) by mouth 2 (two) times daily before a meal., Disp: 180 capsule, Rfl: 1   polyethylene glycol (MIRALAX / GLYCOLAX) 17 g packet, Take 17 g by mouth 2 (two) times daily., Disp: 72 each, Rfl: 0   pravastatin (PRAVACHOL) 40 MG tablet, Take 1 tablet (40 mg total) by mouth every evening., Disp: 90 tablet, Rfl: 1   Probiotic Product (PROBIOTIC DAILY PO), Take 1 capsule by mouth daily., Disp: , Rfl:    prochlorperazine (COMPAZINE) 10 MG tablet, Take 1 tablet (10 mg total) by mouth every 6 (six) hours as needed for nausea or vomiting., Disp: 30 tablet, Rfl: 0   senna-docusate (SENOKOT-S) 8.6-50 MG tablet, Take 1 tablet by mouth 2 (two) times daily. (Patient taking differently: Take 1 tablet by mouth daily as needed for mild constipation.), Disp: , Rfl:    simethicone (MYLICON) 80 MG chewable tablet, Chew 1 tablet (80 mg total) by mouth 4 (four) times daily as needed for flatulence (Bloating)., Disp: 30 tablet, Rfl: 0   Wheat Dextrin (BENEFIBER) POWD, Take 1 Scoop by mouth 2 (two) times daily as needed (constipation)., Disp: , Rfl:   EXAM:  VITALS per patient if  applicable:  GENERAL: alert, oriented, appears well and in no acute distress  HEENT: atraumatic, conjunttiva clear, no obvious abnormalities on inspection of external nose and ears  NECK: normal movements of the head and neck  LUNGS: on inspection no signs of respiratory distress, breathing rate appears normal, no obvious gross SOB, gasping or wheezing  CV: no obvious cyanosis  MS: moves all visible extremities without noticeable abnormality  PSYCH/NEURO: pleasant and cooperative, no obvious depression or anxiety, speech and thought processing grossly intact  ASSESSMENT AND PLAN:  Discussed the following assessment and plan:  Nasal congestion  Sneezing  -we discussed possible serious and likely etiologies, options for evaluation and workup, limitations of telemedicine visit vs in person visit, treatment, treatment risks and precautions. Pt  prefers to treat via telemedicine empirically rather than in person at this moment. Query allergic rhinitis vs other. Opted to try changing antihistamine, nasal saline, 2 week course of flonase and follow up with PCP in 2 weeks.  Scheduled follow up with PCP offered: they agree to schedule follow up with PCP Advised to seek prompt in person care if worsening, new symptoms arise, or if is not improving with treatment. Discussed options for inperson care if PCP office not available.    I discussed the assessment and treatment plan with the patient. The patient was provided an opportunity to ask questions and all were answered. The patient agreed with the plan and demonstrated an understanding of the instructions.     Lucretia Kern, DO

## 2021-01-14 NOTE — Progress Notes (Signed)
[facility] OFFICE PROGRESS NOTE  [name], MD [place] [address]  DIAGNOSIS: Stage IV non-small cell lung cancer (T, N0, M1C) adenocarcinoma.  The patient presented with a right upper lobe lung mass and solitary brain metastasis.  The patient was diagnosed in February 2022.   Biomarker Findings Microsatellite status - MS-Stable Tumor Mutational Burden - 8 Muts/Mb Genomic Findings For a complete list of the genes assayed, please refer to the Appendix. KRAS G12V KEAP1 S224F TP53 P151T 7 Disease relevant genes with no reportable alterations: ALK, BRAF, EGFR, ERBB2, MET, RET, ROS1   PDL1 Expression: 90%  PRIOR THERAPY: 1) SRS to the metastatic brain lesion on 08/23/2020 under the care of Dr. Tammi Klippel and craniotomy under the care of Dr. Zada Finders on 08/24/2020. 2) S/p robotic assisted right upper lobectomy with en bloc wedge resection of the right middle lobe and lymph node dissection under the care of Dr. Roxan Hockey on September 24, 2020 3) SRS to the two new subcentimeter metastases under the care of Dr. Tammi Klippel on 11/29/20.   CURRENT THERAPY: Systemic chemotherapy with carboplatin for AUC of 5, Alimta 500 Mg/M2 and Keytruda 200 mg IV every 3 weeks.  First dose Nov 08, 2020. Status post 1 cycle.   INTERVAL HISTORY: ROENA SASSAMAN 70 y.o. female returns to the clinic today for a follow-up visit accompanied by her family member. The patient is feeling fairly well today. Her only concern is sometimes she feels like her heart is racing. She checks her pulse and her pulse is 95 or less. She is established with cardiology and has an appointment next month. No associated lightheadedness or shortness of breath. Regarding her chemotherapy, she is status post 3 cycles and tolerated it well. She denies changes with her breathing. She denies chest pain, shortness of breath, cough, or hemoptysis.   She denies fevers or chills. Her weight is stable and her appetite is  good. She has some baseline night sweats. She she denies any nausea, vomiting, diarrhea, or constipation.  She denies any rashes or skin changes. She denies headaches or visual changes. She has a repeat MRI scheduled on 02/18/21 and a follow up scheduled with Dr. Mickeal Skinner. She has a follow up with her psychiatrist next week for her anxiety. She recently had a restaging CT scan performed. She is here for evaluation and to review her scan results before starting cycle #4.    MEDICAL HISTORY: Past Medical History:  Diagnosis Date   Allergy    Anemia    Anxiety    Arthritis    Carpal tunnel syndrome    Constipation    senna C stool softeners help    Depression    Diverticulosis    Dyslipidemia    External hemorrhoids    GERD (gastroesophageal reflux disease)    Heart murmur    mild-moderate AR   Hiatal hernia    Hyperlipidemia    on meds    Hypertension    Internal hemorrhoids    lung ca with brain mets 07/2020   Osteoarthritis    Pre-diabetes    PVD (peripheral vascular disease) (HCC)    moderate carotid disease   RBBB    Smoker    Vocal cord polyps     ALLERGIES:  is allergic to amlodipine, chantix [varenicline tartrate], clarithromycin, lisinopril, simvastatin, wellbutrin [bupropion hcl], lipitor [atorvastatin], and sertraline.  MEDICATIONS:  Current Outpatient Medications  Medication Sig Dispense Refill   acetaminophen (TYLENOL) 500 MG tablet  Take 2 tablets (1,000 mg total) by mouth every 6 (six) hours. 30 tablet 0   ALPRAZolam (XANAX) 0.5 MG tablet Take 1 tablet (0.5 mg total) by mouth 3 (three) times daily as needed for anxiety. 90 tablet 5   Ascorbic Acid (VITAMIN C) 1000 MG tablet Take 1,000 mg by mouth daily.     aspirin 81 MG tablet Take 1 tablet (81 mg total) by mouth daily. Restart on 08/31/20 30 tablet    carboxymethylcellulose (REFRESH PLUS) 0.5 % SOLN Place 1-2 drops into both eyes daily.     folic acid (FOLVITE) 1 MG tablet Take 1 tablet (1 mg total) by mouth  daily. 30 tablet 4   ketoconazole (NIZORAL) 2 % cream Apply 1 application topically 2 (two) times daily. 15 g 2   loratadine (CLARITIN) 10 MG tablet Take 10 mg by mouth daily as needed for allergies.     mirtazapine (REMERON) 45 MG tablet Take 1 tablet (45 mg total) by mouth at bedtime. 30 tablet 3   neomycin-bacitracin-polymyxin (NEOSPORIN) ointment Apply 1 application topically as needed for wound care.     omeprazole (PRILOSEC) 40 MG capsule Take 1 capsule (40 mg total) by mouth 2 (two) times daily before a meal. 180 capsule 1   polyethylene glycol (MIRALAX / GLYCOLAX) 17 g packet Take 17 g by mouth 2 (two) times daily. 72 each 0   pravastatin (PRAVACHOL) 40 MG tablet Take 1 tablet (40 mg total) by mouth every evening. 90 tablet 1   Probiotic Product (PROBIOTIC DAILY PO) Take 1 capsule by mouth daily.     senna-docusate (SENOKOT-S) 8.6-50 MG tablet Take 1 tablet by mouth 2 (two) times daily. (Patient taking differently: Take 1 tablet by mouth daily as needed for mild constipation.)     simethicone (MYLICON) 80 MG chewable tablet Chew 1 tablet (80 mg total) by mouth 4 (four) times daily as needed for flatulence (Bloating). 30 tablet 0   hydrochlorothiazide (HYDRODIURIL) 12.5 MG tablet Take 1 tablet (12.5 mg total) by mouth daily. (Patient not taking: Reported on 01/17/2021) 90 tablet 1   prochlorperazine (COMPAZINE) 10 MG tablet Take 1 tablet (10 mg total) by mouth every 6 (six) hours as needed for nausea or vomiting. (Patient not taking: Reported on 01/17/2021) 30 tablet 0   Wheat Dextrin (BENEFIBER) POWD Take 1 Scoop by mouth 2 (two) times daily as needed (constipation). (Patient not taking: Reported on 01/17/2021)     No current facility-administered medications for this visit.    SURGICAL HISTORY:  Past Surgical History:  Procedure Laterality Date   ABDOMINAL HYSTERECTOMY  2778   APPLICATION OF CRANIAL NAVIGATION N/A 08/24/2020   Procedure: APPLICATION OF CRANIAL NAVIGATION;  Surgeon:  Judith Part, MD;  Location: [facility];  Service: Neurosurgery;  Laterality: N/A;   COLONOSCOPY     COLONOSCOPY W/ POLYPECTOMY  2009   CRANIOTOMY Left 08/24/2020   Procedure: Left Craniotomy for Tumor Resection with Brainlab;  Surgeon: Judith Part, MD;  Location: [facility];  Service: Neurosurgery;  Laterality: Left;   HEMORRHOID SURGERY     INTERCOSTAL NERVE BLOCK Right 09/24/2020   Procedure: INTERCOSTAL NERVE BLOCK;  Surgeon: Melrose Nakayama, MD;  Location: [facility];  Service: Thoracic;  Laterality: Right;   JOINT REPLACEMENT     LYMPH NODE DISSECTION Right 09/24/2020   Procedure: LYMPH NODE DISSECTION;  Surgeon: Melrose Nakayama, MD;  Location: [facility];  Service: Thoracic;  Laterality: Right;   POLYPECTOMY     right total knee  arthroplasty     Dr. Emeterio Reeve 06-04-18   [facility]  08/16/2009   TOTAL KNEE ARTHROPLASTY Left 02/13/2014   Procedure: TOTAL KNEE ARTHROPLASTY;  Surgeon: Alta Corning, MD;  Location: [facility];  Service: Orthopedics;  Laterality: Left;   TOTAL KNEE ARTHROPLASTY Right 06/04/2018   Procedure: RIGHT TOTAL KNEE ARTHROPLASTY;  Surgeon: Dorna Leitz, MD;  Location: [facility];  Service: Orthopedics;  Laterality: Right;  Adductor Block   TUBAL LIGATION      REVIEW OF SYSTEMS:   Review of Systems Constitutional: Negative for appetite change, chills, fatigue, fever and unexpected weight change. HENT: Negative for mouth sores, nosebleeds, sore throat and trouble swallowing.   Eyes: Negative for eye problems and icterus. Respiratory: Negative for cough, hemoptysis, shortness of breath and wheezing.   Cardiovascular: Positive for palpitations. Negative for chest pain and leg swelling. Gastrointestinal: Negative for abdominal pain, constipation, diarrhea, nausea and vomiting. Genitourinary: Negative for bladder incontinence, difficulty urinating, dysuria, frequency and hematuria.   Musculoskeletal: Negative for back pain, gait problem, neck pain and neck stiffness. Skin:  Negative for itching and rash. Neurological: Negative for dizziness, extremity weakness, gait problem, headaches, light-headedness and seizures. Hematological: Negative for adenopathy. Does not bruise/bleed easily. Psychiatric/Behavioral: Negative for confusion, depression and sleep disturbance. The patient is not nervous/anxious.     PHYSICAL EXAMINATION:  Blood pressure 131/77, pulse 93, temperature (!) 97.3 F (36.3 C), temperature source Tympanic, resp. rate 18, height 5' (1.524 m), weight 117 lb 8 oz (53.3 kg), SpO2 100 %.  ECOG PERFORMANCE STATUS: 1  Physical Exam  Constitutional: Oriented to person, place, and time and thin appearing female and in no distress. HENT: Head: Normocephalic and atraumatic. Mouth/Throat: Oropharynx is clear and moist. No oropharyngeal exudate. Eyes: Conjunctivae are normal. Right eye exhibits no discharge. Left eye exhibits no discharge. No scleral icterus. Neck: Normal range of motion. Neck supple. Cardiovascular: Normal rate, regular rhythm, mild systolic murmur noted and intact distal pulses.   Pulmonary/Chest: Effort normal. Decreased breath sounds in right lung base. No respiratory distress. No wheezes. No rales. Abdominal: Soft. Bowel sounds are normal. Exhibits no distension and no mass. There is no tenderness.  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 No weakness in hands. No decreased sensation. No swelling or erythema. Some tenderness to palpation over MCP. Normal range of motion. Exhibits no edema.  Lymphadenopathy:    No cervical adenopathy.  Neurological: Alert and oriented to person, place, and time. Exhibits normal muscle tone. Gait normal. Coordination normal. Skin: Skin is warm and dry. No rash noted. Not diaphoretic. No erythema. No pallor.  Psychiatric: Mood, memory and judgment normal. Vitals reviewed  LABORATORY DATA: Lab Results  Component Value Date   WBC 4.6 01/16/2021   HGB 11.7 (L) 01/16/2021   HCT 37.8 01/16/2021    MCV 85.3 01/16/2021   PLT 266 01/16/2021      Chemistry      Component Value Date/Time   NA 142 01/16/2021 0758   K 4.1 01/16/2021 0758   CL 102 01/16/2021 0758   CO2 29 01/16/2021 0758   BUN 9 01/16/2021 0758   CREATININE 0.76 01/16/2021 0758   CREATININE 0.79 03/14/2020 1016      Component Value Date/Time   CALCIUM 10.7 (H) 01/16/2021 0758   ALKPHOS 83 01/16/2021 0758   AST 26 01/16/2021 0758   ALT 16 01/16/2021 0758   BILITOT <0.2 (L) 01/16/2021 0758       RADIOGRAPHIC STUDIES:  CT Chest W Contrast  Result Date:  01/16/2021 CLINICAL DATA:  Primary Cancer Type: Lung Imaging Indication: Assess response to therapy Interval therapy since last imaging? Yes Initial Cancer Diagnosis Date: 09/24/2020; Established by: Biopsy-proven Detailed Pathology: Stage IV non-small cell lung cancer, adenocarcinoma. Primary Tumor location: Right upper lobe.  Brain metastasis. Surgeries: Right upper lobectomy 09/24/2020. Left frontal craniotomy 08/24/2020. Hysterectomy. Chemotherapy: Yes; Ongoing? Yes; Most recent administration: 12/26/2020 Immunotherapy?  Yes; Type: Keytruda; Ongoing? Yes Radiation therapy? Yes; Date Range: 08/23/2020 and 11/29/2020; Target: Brain EXAM: CT CHEST, ABDOMEN, AND PELVIS WITH CONTRAST TECHNIQUE: Multidetector CT imaging of the chest, abdomen and pelvis was performed following the standard protocol during bolus administration of intravenous contrast. CONTRAST:  31m OMNIPAQUE IOHEXOL 350 MG/ML SOLN COMPARISON:  Most recent CT abdomen and pelvis 08/17/2020. 08/08/2020 PET-CT. 08/02/2020 CT chest, abdomen and pelvis. FINDINGS: CT CHEST FINDINGS Cardiovascular: The heart is normal in size. No pericardial effusion. There is mild tortuosity, ectasia and calcification of the thoracic aorta but no dissection. The branch vessels are patent. Stable scattered coronary artery calcifications. Mediastinum/Nodes: No mediastinal or hilar mass or lymphadenopathy. The esophagus is grossly  normal. Lungs/Pleura: Surgical changes from a right upper lobe lobectomy. No findings suspicious for residual or recurrent tumor. No pulmonary nodules to suggest pulmonary metastatic disease. Stable underlying emphysematous changes. Small right-sided pleural effusion. Musculoskeletal: No significant bony findings. CT ABDOMEN PELVIS FINDINGS Hepatobiliary: No hepatic lesions to suggest metastatic disease. No intrahepatic biliary dilatation. The gallbladder is unremarkable. No common bile duct dilatation. Pancreas: No mass, inflammation or ductal dilatation. Spleen: Normal size.  No focal lesions. Adrenals/Urinary Tract: Adrenal glands are normal. No renal lesions or hydronephrosis.  The bladder is unremarkable. Stomach/Bowel: Stomach, duodenum, small bowel and colon are unremarkable. No acute inflammatory changes, mass lesions or obstructive findings. The terminal ileum is normal. The appendix is normal. Vascular/Lymphatic: Advanced atherosclerotic calcifications involving the distal aorta and iliac arteries but no aneurysm or dissection. The branch vessels are patent. The major venous structures are patent. No mesenteric or retroperitoneal mass or adenopathy. Reproductive: Surgically absent. Other: No pelvic mass or adenopathy. No free pelvic fluid collections. No inguinal mass or adenopathy. No abdominal wall hernia or subcutaneous lesions. Musculoskeletal: No worrisome lytic or sclerotic bone lesions to suggest metastatic disease. Stable osteoporosis and degenerative changes involving the spine. Remote postsurgical changes involving the lumbar spine. Stable degenerative changes involving both hips. IMPRESSION: 1. Surgical changes from a right upper lobe lobectomy. No findings suspicious for residual or recurrent tumor. 2. No mediastinal or hilar mass or adenopathy. 3. Stable emphysematous changes. 4. Small right-sided pleural effusion. 5. No findings for abdominal/pelvic or osseous metastatic disease. 6. Advanced  atherosclerotic calcifications involving the thoracic and abdominal aorta and iliac arteries. 7. Emphysema and aortic atherosclerosis. Aortic Atherosclerosis (ICD10-I70.0) and Emphysema (ICD10-J43.9). Electronically Signed   By: PMarijo SanesM.D.   On: 01/16/2021 11:32   CT Abdomen Pelvis W Contrast  Result Date: 01/16/2021 CLINICAL DATA:  Primary Cancer Type: Lung Imaging Indication: Assess response to therapy Interval therapy since last imaging? Yes Initial Cancer Diagnosis Date: 09/24/2020; Established by: Biopsy-proven Detailed Pathology: Stage IV non-small cell lung cancer, adenocarcinoma. Primary Tumor location: Right upper lobe.  Brain metastasis. Surgeries: Right upper lobectomy 09/24/2020. Left frontal craniotomy 08/24/2020. Hysterectomy. Chemotherapy: Yes; Ongoing? Yes; Most recent administration: 12/26/2020 Immunotherapy?  Yes; Type: Keytruda; Ongoing? Yes Radiation therapy? Yes; Date Range: 08/23/2020 and 11/29/2020; Target: Brain EXAM: CT CHEST, ABDOMEN, AND PELVIS WITH CONTRAST TECHNIQUE: Multidetector CT imaging of the chest, abdomen and pelvis was performed following the standard protocol during  bolus administration of intravenous contrast. CONTRAST:  6m OMNIPAQUE IOHEXOL 350 MG/ML SOLN COMPARISON:  Most recent CT abdomen and pelvis 08/17/2020. 08/08/2020 PET-CT. 08/02/2020 CT chest, abdomen and pelvis. FINDINGS: CT CHEST FINDINGS Cardiovascular: The heart is normal in size. No pericardial effusion. There is mild tortuosity, ectasia and calcification of the thoracic aorta but no dissection. The branch vessels are patent. Stable scattered coronary artery calcifications. Mediastinum/Nodes: No mediastinal or hilar mass or lymphadenopathy. The esophagus is grossly normal. Lungs/Pleura: Surgical changes from a right upper lobe lobectomy. No findings suspicious for residual or recurrent tumor. No pulmonary nodules to suggest pulmonary metastatic disease. Stable underlying emphysematous changes.  Small right-sided pleural effusion. Musculoskeletal: No significant bony findings. CT ABDOMEN PELVIS FINDINGS Hepatobiliary: No hepatic lesions to suggest metastatic disease. No intrahepatic biliary dilatation. The gallbladder is unremarkable. No common bile duct dilatation. Pancreas: No mass, inflammation or ductal dilatation. Spleen: Normal size.  No focal lesions. Adrenals/Urinary Tract: Adrenal glands are normal. No renal lesions or hydronephrosis.  The bladder is unremarkable. Stomach/Bowel: Stomach, duodenum, small bowel and colon are unremarkable. No acute inflammatory changes, mass lesions or obstructive findings. The terminal ileum is normal. The appendix is normal. Vascular/Lymphatic: Advanced atherosclerotic calcifications involving the distal aorta and iliac arteries but no aneurysm or dissection. The branch vessels are patent. The major venous structures are patent. No mesenteric or retroperitoneal mass or adenopathy. Reproductive: Surgically absent. Other: No pelvic mass or adenopathy. No free pelvic fluid collections. No inguinal mass or adenopathy. No abdominal wall hernia or subcutaneous lesions. Musculoskeletal: No worrisome lytic or sclerotic bone lesions to suggest metastatic disease. Stable osteoporosis and degenerative changes involving the spine. Remote postsurgical changes involving the lumbar spine. Stable degenerative changes involving both hips. IMPRESSION: 1. Surgical changes from a right upper lobe lobectomy. No findings suspicious for residual or recurrent tumor. 2. No mediastinal or hilar mass or adenopathy. 3. Stable emphysematous changes. 4. Small right-sided pleural effusion. 5. No findings for abdominal/pelvic or osseous metastatic disease. 6. Advanced atherosclerotic calcifications involving the thoracic and abdominal aorta and iliac arteries. 7. Emphysema and aortic atherosclerosis. Aortic Atherosclerosis (ICD10-I70.0) and Emphysema (ICD10-J43.9). Electronically Signed   By: PMarijo SanesM.D.   On: 01/16/2021 11:32     ASSESSMENT/PLAN:  This is a very pleasant 70year old African-American female diagnosed with stage IV (T3, N0, M1 B) non-small cell lung cancer, adenocarcinoma.  She presented with a right upper lobe lung mass with a solitary brain metastasis.  She was diagnosed in February 2022.  Her PD-L1 expression is 90%.  She does not have any actionable mutations.   The patient completed SRS followed by craniotomy and resection on 08/23/2020 under the care of Dr. MTammi Klippeland craniotomy under the care of Dr. OZada Finderson 08/24/2020.   She then had a robot-assisted right upper lobectomy with en bloc wedge resection of the right middle lobe and lymph node dissection under the care of Dr. HRoxan Hockeyon September 24, 2020.  She underwent SRS to the two new subcentimeter brain metastases on 11/29/20.   The patient is currently undergoing systemic chemotherapy with 4-6 cycles of carboplatin for an AUC of 5, Alimta 500 mg per metered squared, Keytruda 200 mg IV every 3 weeks.  She is status post 3 cycles and tolerated it well without any adverse side effects.   The patient recently had a restaging CT scan performed.  Dr. MJulien Nordmannpersonally and independently reviewed the scan and discussed the results with the patient today.  Scan showed no evidence for disease.  She has a stable pleural effusion which she is not symptomatic from. Dr. Julien Nordmann recommends that she continue on the same treatment at the same dose.  Dr. Julien Nordmann discussed we will arrange for a follow up CT scan after cycle #6. At that time, we will discuss with her if she would like to continue on observation with close monitoring vs. Continue on maintenance Alimta and Keytruda for up to 2 years.   She will proceed with cycle #4 today scheduled.  We will see her back for follow-up visit in 3 weeks for evaluation before starting cycle #5.  Recommend they call her cardiologists office if she is requesting to be seen  sooner. Discussed that if she ever develops palpitations, tachycardia, lightheadedness, and/or shortness of breath to seek emergency evaluation.   The patient was advised to call immediately if she has any concerning symptoms in the interval. The patient voices understanding of current disease status and treatment options and is in agreement with the current care plan. All questions were answered. The patient knows to call the clinic with any problems, questions or concerns. We can certainly see the patient much sooner if necessary       Orders Placed This Encounter  Procedures   TSH    Standing Status:   Standing    Number of Occurrences:   10    Standing Expiration Date:   01/17/2022      Tobe Sos Marbeth Smedley, PA-C 01/17/21  ADDENDUM: Hematology/Oncology Attending: I had a face-to-face encounter with the patient today.  I reviewed her record, lab and scan and recommended her care plan.  This is a very pleasant 70 years old African-American female with stage IV (T3, N0, M1 B) non-small cell lung cancer presented with right upper lobe lung mass in addition to a solitary brain metastasis diagnosed in February 2022 with PD-L1 expression of 90%.  The patient has no actionable mutation.  She underwent craniotomy with resection of the brain lesion in addition to right upper lobectomy with en bloc wedge resection of the right middle lobe and lymph node dissection under the care of Dr. Roxan Hockey. The patient is currently undergoing systemic chemotherapy with carboplatin, Alimta and Keytruda status post 3 cycles. She has been tolerating her treatment with the systemic chemotherapy fairly well with no significant adverse effects. She had repeat CT scan of the chest, abdomen pelvis performed recently.  I personally and independently reviewed the scans and discussed the results with the patient and her daughter. Her scan showed no concerning findings for disease progression. I recommended  for the patient to continue her current treatment with the same regimen and she will proceed with cycle #4 today.  We will may consider discontinuing her treatment after cycle #6 but other option was to continue her maintenance treatment up to 2 years. The patient will come back for follow-up visit in 3 weeks for evaluation and management of her adverse effect before starting cycle #5. She was advised to call immediately if she has any concerning symptoms in the interval. The total time spent in the appointment was 35 minutes. Disclaimer: This note was dictated with voice recognition software. Similar sounding words can inadvertently be transcribed and may be missed upon review. Eilleen Kempf, MD 01/17/21

## 2021-01-16 ENCOUNTER — Ambulatory Visit (HOSPITAL_COMMUNITY)
Admission: RE | Admit: 2021-01-16 | Discharge: 2021-01-16 | Disposition: A | Discharge status: Home / Self Care | Payer: Medicare HMO | Source: Ambulatory Visit | Attending: Internal Medicine | Admitting: Internal Medicine

## 2021-01-16 ENCOUNTER — Inpatient Hospital Stay: Payer: Medicare HMO

## 2021-01-16 ENCOUNTER — Other Ambulatory Visit: Payer: Self-pay

## 2021-01-16 ENCOUNTER — Encounter (HOSPITAL_COMMUNITY): Payer: Self-pay

## 2021-01-16 DIAGNOSIS — J439 Emphysema, unspecified: Secondary | ICD-10-CM | POA: Diagnosis not present

## 2021-01-16 DIAGNOSIS — I700 Atherosclerosis of aorta: Secondary | ICD-10-CM | POA: Diagnosis not present

## 2021-01-16 DIAGNOSIS — J90 Pleural effusion, not elsewhere classified: Secondary | ICD-10-CM | POA: Diagnosis not present

## 2021-01-16 DIAGNOSIS — R61 Generalized hyperhidrosis: Secondary | ICD-10-CM | POA: Diagnosis not present

## 2021-01-16 DIAGNOSIS — Z5112 Encounter for antineoplastic immunotherapy: Secondary | ICD-10-CM | POA: Diagnosis not present

## 2021-01-16 DIAGNOSIS — E785 Hyperlipidemia, unspecified: Secondary | ICD-10-CM | POA: Diagnosis not present

## 2021-01-16 DIAGNOSIS — I10 Essential (primary) hypertension: Secondary | ICD-10-CM | POA: Diagnosis not present

## 2021-01-16 DIAGNOSIS — C7931 Secondary malignant neoplasm of brain: Secondary | ICD-10-CM | POA: Diagnosis not present

## 2021-01-16 DIAGNOSIS — C3490 Malignant neoplasm of unspecified part of unspecified bronchus or lung: Secondary | ICD-10-CM | POA: Insufficient documentation

## 2021-01-16 DIAGNOSIS — Z902 Acquired absence of lung [part of]: Secondary | ICD-10-CM | POA: Diagnosis not present

## 2021-01-16 DIAGNOSIS — F419 Anxiety disorder, unspecified: Secondary | ICD-10-CM | POA: Diagnosis not present

## 2021-01-16 DIAGNOSIS — C3411 Malignant neoplasm of upper lobe, right bronchus or lung: Secondary | ICD-10-CM | POA: Diagnosis not present

## 2021-01-16 DIAGNOSIS — Z5111 Encounter for antineoplastic chemotherapy: Secondary | ICD-10-CM | POA: Diagnosis not present

## 2021-01-16 LAB — CBC WITH DIFFERENTIAL (CANCER CENTER ONLY)
Abs Immature Granulocytes: 0.01 10*3/uL (ref 0.00–0.07)
Basophils Absolute: 0 10*3/uL (ref 0.0–0.1)
Basophils Relative: 1 %
Eosinophils Absolute: 0 10*3/uL (ref 0.0–0.5)
Eosinophils Relative: 1 %
HCT: 37.8 % (ref 36.0–46.0)
Hemoglobin: 11.7 g/dL — ABNORMAL LOW (ref 12.0–15.0)
Immature Granulocytes: 0 %
Lymphocytes Relative: 30 %
Lymphs Abs: 1.4 10*3/uL (ref 0.7–4.0)
MCH: 26.4 pg (ref 26.0–34.0)
MCHC: 31 g/dL (ref 30.0–36.0)
MCV: 85.3 fL (ref 80.0–100.0)
Monocytes Absolute: 0.7 10*3/uL (ref 0.1–1.0)
Monocytes Relative: 16 %
Neutro Abs: 2.4 10*3/uL (ref 1.7–7.7)
Neutrophils Relative %: 52 %
Platelet Count: 266 10*3/uL (ref 150–400)
RBC: 4.43 MIL/uL (ref 3.87–5.11)
RDW: 14.2 % (ref 11.5–15.5)
WBC Count: 4.6 10*3/uL (ref 4.0–10.5)
nRBC: 0 % (ref 0.0–0.2)

## 2021-01-16 LAB — CMP (CANCER CENTER ONLY)
ALT: 16 U/L (ref 0–44)
AST: 26 U/L (ref 15–41)
Albumin: 4 g/dL (ref 3.5–5.0)
Alkaline Phosphatase: 83 U/L (ref 38–126)
Anion gap: 11 (ref 5–15)
BUN: 9 mg/dL (ref 8–23)
CO2: 29 mmol/L (ref 22–32)
Calcium: 10.7 mg/dL — ABNORMAL HIGH (ref 8.9–10.3)
Chloride: 102 mmol/L (ref 98–111)
Creatinine: 0.76 mg/dL (ref 0.44–1.00)
GFR, Estimated: >60 mL/min (ref >60)
Glucose, Bld: 103 mg/dL — ABNORMAL HIGH (ref 70–99)
Potassium: 4.1 mmol/L (ref 3.5–5.1)
Sodium: 142 mmol/L (ref 135–145)
Total Bilirubin: <0.2 mg/dL — ABNORMAL LOW (ref 0.3–1.2)
Total Protein: 8 g/dL (ref 6.5–8.1)

## 2021-01-16 LAB — TSH: TSH: 0.71 u[IU]/mL (ref 0.308–3.960)

## 2021-01-16 MED ORDER — IOHEXOL 350 MG/ML SOLN
80.0000 mL | Freq: Once | INTRAVENOUS | Status: AC | PRN
  Administered 2021-01-16: 80 mL via INTRAVENOUS

## 2021-01-16 NOTE — Op Note (Signed)
  Name: Hannah [name]  MRN: [id number]  Date: 08/23/2020   DOB: 04-13-1951  Stereotactic Radiosurgery Operative Note  PRE-OPERATIVE DIAGNOSIS:  Solitary Brain Metastasis  POST-OPERATIVE DIAGNOSIS:  Same  PROCEDURE:  Stereotactic Radiosurgery  SURGEON:  Judith Part, MD  NARRATIVE: The patient underwent a radiation treatment planning session in the radiation oncology simulation suite under the care of the radiation oncology physician and physicist.  I participated closely in the radiation treatment planning afterwards. The patient underwent planning CT which was fused to 3T high resolution MRI with 1 mm axial slices.  These images were fused on the planning system.  We contoured the gross target volumes and subsequently expanded this to yield the Planning Target Volume. I actively participated in the planning process.  I helped to define and review the target contours and also the contours of the optic pathway, eyes, brainstem and selected nearby organs at risk.  All the dose constraints for critical structures were reviewed and compared to AAPM Task Group 101.  The prescription dose conformity was reviewed.  I approved the plan electronically.    Accordingly, Hannah [name] was brought to the TrueBeam stereotactic radiation treatment linac and placed in the custom immobilization mask.  The patient was aligned according to the IR fiducial markers with BrainLab Exactrac, then orthogonal x-rays were used in ExacTrac with the 6DOF robotic table and the shifts were made to align the patient  Hannah [name] received stereotactic radiosurgery uneventfully.    Lesions treated:  1   Complex lesions treated:  0 (>3.5 cm, <40mm of optic path, or within the brainstem)   The detailed description of the procedure is recorded in the radiation oncology procedure note.  I was present for the duration of the procedure.  DISPOSITION:  Following delivery, the patient was transported to nursing in stable  condition and monitored for possible acute effects to be discharged to home in stable condition with follow-up in one month.  Judith Part, MD 01/16/2021 3:25 PM

## 2021-01-16 NOTE — Addendum Note (Signed)
Encounter addended by: Judith Part, MD on: 01/16/2021 3:45 PM  Actions taken: Clinical Note Signed

## 2021-01-17 ENCOUNTER — Inpatient Hospital Stay (HOSPITAL_BASED_OUTPATIENT_CLINIC_OR_DEPARTMENT_OTHER): Payer: Medicare HMO | Admitting: Physician Assistant

## 2021-01-17 ENCOUNTER — Inpatient Hospital Stay: Payer: Medicare HMO

## 2021-01-17 ENCOUNTER — Other Ambulatory Visit: Payer: Medicare HMO

## 2021-01-17 ENCOUNTER — Ambulatory Visit: Payer: Medicare HMO

## 2021-01-17 ENCOUNTER — Ambulatory Visit: Payer: Medicare HMO | Admitting: Physician Assistant

## 2021-01-17 ENCOUNTER — Encounter: Payer: Self-pay | Admitting: Physician Assistant

## 2021-01-17 VITALS — BP 131/77 | HR 93 | Temp 97.3°F | Resp 18 | Ht 60.0 in | Wt 117.5 lb

## 2021-01-17 DIAGNOSIS — J439 Emphysema, unspecified: Secondary | ICD-10-CM | POA: Diagnosis not present

## 2021-01-17 DIAGNOSIS — C7931 Secondary malignant neoplasm of brain: Secondary | ICD-10-CM | POA: Diagnosis not present

## 2021-01-17 DIAGNOSIS — C3411 Malignant neoplasm of upper lobe, right bronchus or lung: Secondary | ICD-10-CM | POA: Diagnosis not present

## 2021-01-17 DIAGNOSIS — Z5111 Encounter for antineoplastic chemotherapy: Secondary | ICD-10-CM

## 2021-01-17 DIAGNOSIS — Z5112 Encounter for antineoplastic immunotherapy: Secondary | ICD-10-CM

## 2021-01-17 DIAGNOSIS — F419 Anxiety disorder, unspecified: Secondary | ICD-10-CM | POA: Diagnosis not present

## 2021-01-17 DIAGNOSIS — R61 Generalized hyperhidrosis: Secondary | ICD-10-CM | POA: Diagnosis not present

## 2021-01-17 DIAGNOSIS — E785 Hyperlipidemia, unspecified: Secondary | ICD-10-CM | POA: Diagnosis not present

## 2021-01-17 DIAGNOSIS — I10 Essential (primary) hypertension: Secondary | ICD-10-CM | POA: Diagnosis not present

## 2021-01-17 MED ORDER — SODIUM CHLORIDE 0.9 % IV SOLN
150.0000 mg | Freq: Once | INTRAVENOUS | Status: AC
  Administered 2021-01-17: 150 mg via INTRAVENOUS
  Filled 2021-01-17: qty 150

## 2021-01-17 MED ORDER — DIPHENHYDRAMINE HCL 25 MG PO CAPS
ORAL_CAPSULE | ORAL | Status: AC
  Filled 2021-01-17: qty 2

## 2021-01-17 MED ORDER — SODIUM CHLORIDE 0.9 % IV SOLN
Freq: Once | INTRAVENOUS | Status: AC
  Filled 2021-01-17: qty 250

## 2021-01-17 MED ORDER — PEMBROLIZUMAB CHEMO INJECTION 100 MG/4ML
200.0000 mg | Freq: Once | INTRAVENOUS | Status: AC
Start: 2021-01-17 — End: 2021-01-17
  Administered 2021-01-17: 200 mg via INTRAVENOUS
  Filled 2021-01-17: qty 8

## 2021-01-17 MED ORDER — ACETAMINOPHEN 325 MG PO TABS
ORAL_TABLET | ORAL | Status: AC
  Filled 2021-01-17: qty 2

## 2021-01-17 MED ORDER — SODIUM CHLORIDE 0.9 % IV SOLN
326.5000 mg | Freq: Once | INTRAVENOUS | Status: AC
  Administered 2021-01-17: 330 mg via INTRAVENOUS
  Filled 2021-01-17: qty 33

## 2021-01-17 MED ORDER — SODIUM CHLORIDE 0.9 % IV SOLN
10.0000 mg | Freq: Once | INTRAVENOUS | Status: AC
  Administered 2021-01-17: 10 mg via INTRAVENOUS
  Filled 2021-01-17: qty 10

## 2021-01-17 MED ORDER — PALONOSETRON HCL INJECTION 0.25 MG/5ML
INTRAVENOUS | Status: AC
  Filled 2021-01-17: qty 5

## 2021-01-17 MED ORDER — PALONOSETRON HCL INJECTION 0.25 MG/5ML
0.2500 mg | Freq: Once | INTRAVENOUS | Status: AC
  Administered 2021-01-17: 0.25 mg via INTRAVENOUS

## 2021-01-17 MED ORDER — SODIUM CHLORIDE 0.9 % IV SOLN
500.0000 mg/m2 | Freq: Once | INTRAVENOUS | Status: AC
  Administered 2021-01-17: 700 mg via INTRAVENOUS
  Filled 2021-01-17: qty 20

## 2021-01-17 NOTE — Patient Instructions (Signed)
[facility]   Discharge Instructions: Thank you for choosing [facility] to provide your oncology and hematology care.   If you have a lab appointment with the [facility], please go directly to the [facility] and check in at the registration area.   Wear comfortable clothing and clothing appropriate for easy access to any Portacath or PICC line.   We strive to give you quality time with your provider. You may need to reschedule your appointment if you arrive late (15 or more minutes).  Arriving late affects you and other patients whose appointments are after yours.  Also, if you miss three or more appointments without notifying the office, you may be dismissed from the clinic at the provider's discretion.      For prescription refill requests, have your pharmacy contact our office and allow 72 hours for refills to be completed.    Today you received the following chemotherapy and/or immunotherapy agents: Pembrolizumab (Keytruda), Pemetrexed (Alimta), and Carboplatin      To help prevent nausea and vomiting after your treatment, we encourage you to take your nausea medication as directed.  BELOW ARE SYMPTOMS THAT SHOULD BE REPORTED IMMEDIATELY: *FEVER GREATER THAN 100.4 F (38 C) OR HIGHER *CHILLS OR SWEATING *NAUSEA AND VOMITING THAT IS NOT CONTROLLED WITH YOUR NAUSEA MEDICATION *UNUSUAL SHORTNESS OF BREATH *UNUSUAL BRUISING OR BLEEDING *URINARY PROBLEMS (pain or burning when urinating, or frequent urination) *BOWEL PROBLEMS (unusual diarrhea, constipation, pain near the anus) TENDERNESS IN MOUTH AND THROAT WITH OR WITHOUT PRESENCE OF ULCERS (sore throat, sores in mouth, or a toothache) UNUSUAL RASH, SWELLING OR PAIN  UNUSUAL VAGINAL DISCHARGE OR ITCHING   Items with * indicate a potential emergency and should be followed up as soon as possible or go to the Emergency Department if any problems should occur.  Please show the CHEMOTHERAPY  ALERT CARD or IMMUNOTHERAPY ALERT CARD at check-in to the Emergency Department and triage nurse.  Should you have questions after your visit or need to cancel or reschedule your appointment, please contact [facility]  Dept: [phone]  and follow the prompts.  Office hours are 8:00 a.m. to 4:30 p.m. Monday - Friday. Please note that voicemails left after 4:00 p.m. may not be returned until the following business day.  We are closed weekends and major holidays. You have access to a nurse at all times for urgent questions. Please call the main number to the clinic Dept: [phone] and follow the prompts.   For any non-urgent questions, you may also contact your provider using MyChart. We now offer e-Visits for anyone 64 and older to request care online for non-urgent symptoms. For details visit [email or web address].   Also download the MyChart app! Go to the app store, search "MyChart", open the app, select [facility], and log in with your MyChart username and password.  Due to Covid, a mask is required upon entering the hospital/clinic. If you do not have a mask, one will be given to you upon arrival. For doctor visits, patients may have 1 support person aged 16 or older with them. For treatment visits, patients cannot have anyone with them due to current Covid guidelines and our immunocompromised population.

## 2021-01-18 ENCOUNTER — Telehealth: Payer: Self-pay | Admitting: Physician Assistant

## 2021-01-18 NOTE — Telephone Encounter (Signed)
Scheduled per los. Called, not able to leave msg. Mailed printout  

## 2021-01-20 ENCOUNTER — Encounter: Payer: Self-pay | Admitting: Internal Medicine

## 2021-01-20 NOTE — Progress Notes (Signed)
  Radiation Oncology         [phone] ________________________________  Name: Hannah [name] MRN: [id number]  Date: 11/29/2020  DOB: 07-29-1950  End of Treatment Note  Diagnosis:   70 yo woman with two subcentimeter brain metastases from adenocarcinoma of the right upper lung     Indication for treatment:  Palliation       Radiation treatment dates:   11/29/20  Site/dose/beams/energy:   Hannah [name] received stereotactic radiosurgery to the following targets: Right parietal 9 mm target was treated using 3 Dynamic Conformal Arcs to a prescription dose of 20 Gy.  ExacTrac registration was performed for each couch angle.  The 100% isodose line was prescribed.  6 MV X-rays were delivered in the flattening filter free beam mode. Left parietal 4 mm target was treated using 3 Dynamic Conformal Arcs to a prescription dose of 20 Gy.  ExacTrac registration was performed for each couch angle.  The 100% isodose line was prescribed.  6 MV X-rays were delivered in the flattening filter free beam mode.  Narrative: The patient tolerated radiation treatment relatively well.     Plan: The patient has completed radiation treatment. The patient will return to radiation oncology clinic for routine followup in one month. I advised her to call or return sooner if she has any questions or concerns related to her recovery or treatment. ________________________________  Sheral Apley. Tammi Klippel, M.D.

## 2021-01-21 ENCOUNTER — Telehealth: Payer: Self-pay | Admitting: Cardiology

## 2021-01-21 ENCOUNTER — Encounter: Payer: Self-pay | Admitting: Internal Medicine

## 2021-01-21 NOTE — Telephone Encounter (Signed)
STAT if HR is under 50 or over 120 (normal HR is 60-100 beats per minute)  What is your heart rate? Lowest 86 and around 104  Do you have a log of your heart rate readings (document readings)? Lowest 86 and around 104  Do you have any other symptoms? no    Patient's daughter states the patient has been complaining of a fast HR. She states the patient's HR has not been out of the normal range and has no other symptoms. She says her BP has been averaging around 104/61. She says she will also contact the patient's PCP.

## 2021-01-23 ENCOUNTER — Other Ambulatory Visit: Payer: Self-pay

## 2021-01-23 ENCOUNTER — Other Ambulatory Visit (HOSPITAL_COMMUNITY): Payer: Self-pay | Admitting: *Deleted

## 2021-01-23 ENCOUNTER — Ambulatory Visit (INDEPENDENT_AMBULATORY_CARE_PROVIDER_SITE_OTHER): Payer: Medicare HMO | Admitting: Psychiatry

## 2021-01-23 DIAGNOSIS — F4323 Adjustment disorder with mixed anxiety and depressed mood: Secondary | ICD-10-CM

## 2021-01-23 MED ORDER — ALPRAZOLAM 0.5 MG PO TABS: ORAL_TABLET | ORAL | 5 refills | Status: DC

## 2021-01-23 MED ORDER — SERTRALINE HCL 50 MG PO TABS: ORAL_TABLET | ORAL | 4 refills | Status: DC

## 2021-01-23 MED ORDER — ALPRAZOLAM 0.5 MG PO TABS: 0.5000 mg | ORAL_TABLET | ORAL | 0 refills | Status: DC | PRN

## 2021-01-23 MED ORDER — ALPRAZOLAM ER 1 MG PO TB24
ORAL_TABLET | ORAL | 4 refills | Status: DC
Start: 2021-01-23 — End: 2021-03-26

## 2021-01-23 MED ORDER — MIRTAZAPINE 45 MG PO TABS: 45.0000 mg | ORAL_TABLET | Freq: Every day | ORAL | 3 refills | Status: DC

## 2021-01-23 NOTE — Progress Notes (Signed)
Psychiatric Initial Adult Assessment   Patient Identification: Hannah [name] MRN:  [id number] Date of Evaluation:  01/23/2021 Referral Source: From the community Chief Complaint:   Visit Diagnosis: No diagnosis found.  History of Present Illness:    Today the patient is seen with her husband Juanda Crumble.  The patient is distinctly more nervous.  She describes herself as being more jittery.  She feels anxious all through the day and pursing in the morning when she wakes up she wakes up in a striking manner.  This craft resembles when her Xanax has dropped off.  The patient does describe daily persistent depression.  It is no better despite the maximum dose of Remeron.  Her sleep is clearly reduced.  She only sleeps about 5 hours a day.  Her appetite is clearly reduced that she has lost a little bit of weight.  It should be noted the patient is receiving chemotherapy at this time.  Her energy level is fair and according to her husband she can think and concentrate without problem.  The patient is still able to enjoy some things around her like the television and her family.  She denies a feeling of worthlessness and she certainly is not suicidal.  The patient says she has another chemotherapy coming up in a few weeks and then she suspects 1 more after that.  The patient is able to articulate that she has cancer and that she has had a lobectomy and removal of a mat on her frontal lobe.  She tells me that they have got it all but she still has to have chemotherapy.  Her clarification of her prognosis she says will be told to her at the end of her chemotherapy which should be in the next month or 2.  Patient does not know how to feel.  In some ways she appears to be anxious but she does not know what exactly to do at this point.  She says she is afraid to get close to people because she is afraid that she is going to lose them.  I suspect that she is really saying she does not want to get close to other people  because she will be dying.  I think the patient clearly needs to be in some form of therapy.  Her husband is very supportive.  The patient drinks no alcohol and uses no drugs.  Associated Signs/Symptoms: Depression Symptoms:  depressed mood, (Hypo) Manic Symptoms:   Anxiety Symptoms:   Psychotic Symptoms:   PTSD Symptoms:   Past Psychiatric History:   Previous Psychotropic Medications:   Substance Abuse History in the last 12 months:    Consequences of Substance Abuse:   Past Medical History:  Past Medical History:  Diagnosis Date   Allergy    Anemia    Anxiety    Arthritis    Carpal tunnel syndrome    Constipation    senna C stool softeners help    Depression    Diverticulosis    Dyslipidemia    External hemorrhoids    GERD (gastroesophageal reflux disease)    Heart murmur    mild-moderate AR   Hiatal hernia    Hyperlipidemia    on meds    Hypertension    Internal hemorrhoids    lung ca with brain mets 07/2020   Osteoarthritis    Pre-diabetes    PVD (peripheral vascular disease) (HCC)    moderate carotid disease   RBBB    Smoker  Vocal cord polyps     Past Surgical History:  Procedure Laterality Date   ABDOMINAL HYSTERECTOMY  3662   APPLICATION OF CRANIAL NAVIGATION N/A 08/24/2020   Procedure: APPLICATION OF CRANIAL NAVIGATION;  Surgeon: Judith Part, MD;  Location: [facility];  Service: Neurosurgery;  Laterality: N/A;   COLONOSCOPY     COLONOSCOPY W/ POLYPECTOMY  2009   CRANIOTOMY Left 08/24/2020   Procedure: Left Craniotomy for Tumor Resection with Brainlab;  Surgeon: Judith Part, MD;  Location: [facility];  Service: Neurosurgery;  Laterality: Left;   HEMORRHOID SURGERY     INTERCOSTAL NERVE BLOCK Right 09/24/2020   Procedure: INTERCOSTAL NERVE BLOCK;  Surgeon: Melrose Nakayama, MD;  Location: [facility];  Service: Thoracic;  Laterality: Right;   JOINT REPLACEMENT     LYMPH NODE DISSECTION Right 09/24/2020   Procedure: LYMPH NODE DISSECTION;   Surgeon: Melrose Nakayama, MD;  Location: [facility];  Service: Thoracic;  Laterality: Right;   POLYPECTOMY     right total knee arthroplasty     Dr. Emeterio Reeve 06-04-18   [facility]  08/16/2009   TOTAL KNEE ARTHROPLASTY Left 02/13/2014   Procedure: TOTAL KNEE ARTHROPLASTY;  Surgeon: Alta Corning, MD;  Location: [facility];  Service: Orthopedics;  Laterality: Left;   TOTAL KNEE ARTHROPLASTY Right 06/04/2018   Procedure: RIGHT TOTAL KNEE ARTHROPLASTY;  Surgeon: Dorna Leitz, MD;  Location: [facility];  Service: Orthopedics;  Laterality: Right;  Adductor Block   TUBAL LIGATION      Family Psychiatric History:   Family History:  Family History  Problem Relation Age of Onset   Cancer Father    Prostate cancer Father    Diabetes Sister    Cancer Sister    Stroke Maternal Grandfather    Colitis Maternal Aunt    Breast cancer Maternal Aunt    Colon cancer Neg Hx    Colon polyps Neg Hx    Rectal cancer Neg Hx    Stomach cancer Neg Hx    Esophageal cancer Neg Hx     Social History:   Social History   Socioeconomic History   Marital status: Married    Spouse name: Not on file   Number of children: 2   Years of education: Not on file   Highest education level: Not on file  Occupational History   Not on file  Tobacco Use   Smoking status: Former    Packs/day: 0.25    Years: 40.00    Pack years: 10.00    Types: Cigarettes    Quit date: 07/09/2020    Years since quitting: 0.5   Smokeless tobacco: Never   Tobacco comments:    PACK WILL LAST 3 days  Vaping Use   Vaping Use: Never used  Substance and Sexual Activity   Alcohol use: No   Drug use: No   Sexual activity: Not Currently  Other Topics Concern   Not on file  Social History Narrative   Not on file   Social Determinants of Health   Financial Resource Strain: Low Risk    Difficulty of Paying Living Expenses: Not hard at all  Food Insecurity: No Food Insecurity   Worried About Charity fundraiser in the Last Year: Never  true   Ran Out of Food in the Last Year: Never true  Transportation Needs: No Transportation Needs   Lack of Transportation (Medical): No   Lack of Transportation (Non-Medical): No  Physical Activity: Not on file  Stress: Not  on file  Social Connections: Not on file    Additional Social History:   Allergies:   Allergies  Allergen Reactions   Amlodipine Swelling and Rash    Rash, swelling   Chantix [Varenicline Tartrate] Shortness Of Breath, Swelling and Other (See Comments)    Tongue swell,sob   Clarithromycin Rash   Lisinopril Hives   Simvastatin Hives   Wellbutrin [Bupropion Hcl] Hives   Lipitor [Atorvastatin]     Dizziness per patient   Sertraline     Makes her feel like she is going to kill someone    Metabolic Disorder Labs: Lab Results  Component Value Date   HGBA1C 6.6 (H) 09/25/2020   MPG 142.72 09/25/2020   MPG 140 03/14/2020   No results found for: PROLACTIN Lab Results  Component Value Date   CHOL 132 03/14/2020   TRIG 104 03/14/2020   HDL 51 03/14/2020   CHOLHDL 2.6 03/14/2020   VLDL 34.8 09/12/2019   LDLCALC 62 03/14/2020   LDLCALC 63 09/12/2019   Lab Results  Component Value Date   TSH 0.710 01/16/2021    Therapeutic Level Labs: No results found for: LITHIUM No results found for: CBMZ No results found for: VALPROATE  Current Medications: Current Outpatient Medications  Medication Sig Dispense Refill   ALPRAZolam (XANAX XR) 1 MG 24 hr tablet 1  qhs 30 tablet 4   ALPRAZolam (XANAX) 0.5 MG tablet Take 1 tablet (0.5 mg total) by mouth every 3 (three) hours as needed for anxiety. 60 tablet 0   sertraline (ZOLOFT) 50 MG tablet 1   qam 30 tablet 4   acetaminophen (TYLENOL) 500 MG tablet Take 2 tablets (1,000 mg total) by mouth every 6 (six) hours. 30 tablet 0   ALPRAZolam (XANAX) 0.5 MG tablet 2  qam   2  @2 ;00 120 tablet 5   Ascorbic Acid (VITAMIN C) 1000 MG tablet Take 1,000 mg by mouth daily.     aspirin 81 MG tablet Take 1 tablet (81 mg  total) by mouth daily. Restart on 08/31/20 30 tablet    carboxymethylcellulose (REFRESH PLUS) 0.5 % SOLN Place 1-2 drops into both eyes daily.     folic acid (FOLVITE) 1 MG tablet Take 1 tablet (1 mg total) by mouth daily. 30 tablet 4   hydrochlorothiazide (HYDRODIURIL) 12.5 MG tablet Take 1 tablet (12.5 mg total) by mouth daily. (Patient not taking: Reported on 01/17/2021) 90 tablet 1   ketoconazole (NIZORAL) 2 % cream Apply 1 application topically 2 (two) times daily. 15 g 2   loratadine (CLARITIN) 10 MG tablet Take 10 mg by mouth daily as needed for allergies.     mirtazapine (REMERON) 45 MG tablet Take 1 tablet (45 mg total) by mouth at bedtime. 30 tablet 3   neomycin-bacitracin-polymyxin (NEOSPORIN) ointment Apply 1 application topically as needed for wound care.     omeprazole (PRILOSEC) 40 MG capsule Take 1 capsule (40 mg total) by mouth 2 (two) times daily before a meal. 180 capsule 1   polyethylene glycol (MIRALAX / GLYCOLAX) 17 g packet Take 17 g by mouth 2 (two) times daily. 72 each 0   pravastatin (PRAVACHOL) 40 MG tablet Take 1 tablet (40 mg total) by mouth every evening. 90 tablet 1   Probiotic Product (PROBIOTIC DAILY PO) Take 1 capsule by mouth daily.     prochlorperazine (COMPAZINE) 10 MG tablet Take 1 tablet (10 mg total) by mouth every 6 (six) hours as needed for nausea or vomiting. (Patient not taking: Reported  on 01/17/2021) 30 tablet 0   senna-docusate (SENOKOT-S) 8.6-50 MG tablet Take 1 tablet by mouth 2 (two) times daily. (Patient taking differently: Take 1 tablet by mouth daily as needed for mild constipation.)     simethicone (MYLICON) 80 MG chewable tablet Chew 1 tablet (80 mg total) by mouth 4 (four) times daily as needed for flatulence (Bloating). 30 tablet 0   Wheat Dextrin (BENEFIBER) POWD Take 1 Scoop by mouth 2 (two) times daily as needed (constipation). (Patient not taking: Reported on 01/17/2021)     No current facility-administered medications for this visit.     Musculoskeletal: Strength & Muscle Tone: within normal limits Gait & Station: normal Patient leans: N/A  Psychiatric Specialty Exam: Review of Systems  There were no vitals taken for this visit.There is no height or weight on file to calculate BMI.  General Appearance: Casual  Eye Contact:  Good  Speech:  Clear and Coherent  Volume:  Normal  Mood:  Depressed  Affect:  Appropriate  Thought Process:  Goal Directed  Orientation:  NA  Thought Content:  Logical  Suicidal Thoughts:  No  Homicidal Thoughts:  No  Memory:  NA  Judgement:  Good  Insight:  Good  Psychomotor Activity:  NA and Normal  Concentration:    Recall:  Good  Fund of Knowledge:Good  Language: Good  Akathisia:  No  Handed:  Right  AIMS (if indicated):  not done  Assets:  Desire for Improvement  ADL's:  Intact  Cognition: WNL  Sleep:  Good   Screenings: GAD-7    Flowsheet Row Office Visit from 09/12/2019 in [facility] at [facility]  Total GAD-7 Score 7      [facility] from 12/21/2017 in [facility]  Total Score (max 30 points ) 30      PHQ2-9    Flowsheet Row Chronic Care Management from 12/27/2020 in [facility] Management from 10/10/2020 in [facility] from 01/11/2020 in [facility] at [facility] Visit from 09/12/2019 in [facility] at [facility] from 12/23/2018 in [facility]  PHQ-2 Total Score 0 1 0 2 1  PHQ-9 Total Score -- -- -- 4 --      Flowsheet Row Admission (Discharged) from 09/24/2020 in [facility] from 09/20/2020 in [facility] PREADMISSION TESTING Admission (Discharged) from 08/24/2020 in [facility] No Risk No Risk No Risk       Assessment and Plan:   At this  time her diagnosis is that of an adjustment disorder with an anxious mood state as well as major depression.  She will continue taking Remeron 45 mg at night and today we will add 50 mg of Zoloft in the morning.  More importantly we will go ahead and change her Xanax.  We will increase it to 0.5 mg 2 in the morning, 2 of them at 2:00 in the afternoon and begin Xanax extended release 1 mg nightly.  In essence she will be moved up from taking 1.5 mg to a higher dose of 3 mg.  Most importantly she will take a long-acting form of Xanax at night hopefully will make her feel more comfortable when she wakes in the morning.  I have asked the patient to talk to the people giving her  chemotherapy about finding a supportive psychotherapist in the cancer chemotherapy setting.  The possibility of getting a psychotherapy in this setting is not out of the question but I think it be more appropriate to have a therapist who can help her deal specifically with her cancer treatment and her cancer disorder.  This patient is to return to see me in 2 months.   Jerral Ralph, MD 7/27/20224:23 PM

## 2021-01-24 ENCOUNTER — Other Ambulatory Visit: Payer: Self-pay

## 2021-01-24 ENCOUNTER — Inpatient Hospital Stay: Payer: Medicare HMO

## 2021-01-24 ENCOUNTER — Other Ambulatory Visit: Payer: Medicare HMO

## 2021-01-24 ENCOUNTER — Telehealth (HOSPITAL_COMMUNITY): Payer: Self-pay | Admitting: *Deleted

## 2021-01-24 ENCOUNTER — Encounter: Payer: Self-pay | Admitting: General Practice

## 2021-01-24 ENCOUNTER — Other Ambulatory Visit (HOSPITAL_COMMUNITY): Payer: Self-pay | Admitting: *Deleted

## 2021-01-24 ENCOUNTER — Telehealth: Payer: Self-pay | Admitting: *Deleted

## 2021-01-24 DIAGNOSIS — Z5111 Encounter for antineoplastic chemotherapy: Secondary | ICD-10-CM | POA: Diagnosis not present

## 2021-01-24 DIAGNOSIS — I10 Essential (primary) hypertension: Secondary | ICD-10-CM | POA: Diagnosis not present

## 2021-01-24 DIAGNOSIS — C3411 Malignant neoplasm of upper lobe, right bronchus or lung: Secondary | ICD-10-CM

## 2021-01-24 DIAGNOSIS — E785 Hyperlipidemia, unspecified: Secondary | ICD-10-CM | POA: Diagnosis not present

## 2021-01-24 DIAGNOSIS — Z5112 Encounter for antineoplastic immunotherapy: Secondary | ICD-10-CM | POA: Diagnosis not present

## 2021-01-24 DIAGNOSIS — J439 Emphysema, unspecified: Secondary | ICD-10-CM | POA: Diagnosis not present

## 2021-01-24 DIAGNOSIS — R61 Generalized hyperhidrosis: Secondary | ICD-10-CM | POA: Diagnosis not present

## 2021-01-24 DIAGNOSIS — C7931 Secondary malignant neoplasm of brain: Secondary | ICD-10-CM | POA: Diagnosis not present

## 2021-01-24 DIAGNOSIS — F419 Anxiety disorder, unspecified: Secondary | ICD-10-CM | POA: Diagnosis not present

## 2021-01-24 LAB — CBC WITH DIFFERENTIAL (CANCER CENTER ONLY)
Abs Immature Granulocytes: 0.01 10*3/uL (ref 0.00–0.07)
Basophils Absolute: 0 10*3/uL (ref 0.0–0.1)
Basophils Relative: 0 %
Eosinophils Absolute: 0 10*3/uL (ref 0.0–0.5)
Eosinophils Relative: 1 %
HCT: 31 % — ABNORMAL LOW (ref 36.0–46.0)
Hemoglobin: 9.9 g/dL — ABNORMAL LOW (ref 12.0–15.0)
Immature Granulocytes: 0 %
Lymphocytes Relative: 67 %
Lymphs Abs: 2 10*3/uL (ref 0.7–4.0)
MCH: 26.6 pg (ref 26.0–34.0)
MCHC: 31.9 g/dL (ref 30.0–36.0)
MCV: 83.3 fL (ref 80.0–100.0)
Monocytes Absolute: 0.4 10*3/uL (ref 0.1–1.0)
Monocytes Relative: 13 %
Neutro Abs: 0.6 10*3/uL — ABNORMAL LOW (ref 1.7–7.7)
Neutrophils Relative %: 19 %
Platelet Count: 143 10*3/uL — ABNORMAL LOW (ref 150–400)
RBC: 3.72 MIL/uL — ABNORMAL LOW (ref 3.87–5.11)
RDW: 13.6 % (ref 11.5–15.5)
WBC Count: 3.1 10*3/uL — ABNORMAL LOW (ref 4.0–10.5)
nRBC: 0 % (ref 0.0–0.2)

## 2021-01-24 LAB — CMP (CANCER CENTER ONLY)
ALT: 23 U/L (ref 0–44)
AST: 28 U/L (ref 15–41)
Albumin: 3.7 g/dL (ref 3.5–5.0)
Alkaline Phosphatase: 76 U/L (ref 38–126)
Anion gap: 9 (ref 5–15)
BUN: 10 mg/dL (ref 8–23)
CO2: 27 mmol/L (ref 22–32)
Calcium: 9.9 mg/dL (ref 8.9–10.3)
Chloride: 104 mmol/L (ref 98–111)
Creatinine: 0.74 mg/dL (ref 0.44–1.00)
GFR, Estimated: >60 mL/min (ref >60)
Glucose, Bld: 103 mg/dL — ABNORMAL HIGH (ref 70–99)
Potassium: 4.2 mmol/L (ref 3.5–5.1)
Sodium: 140 mmol/L (ref 135–145)
Total Bilirubin: <0.2 mg/dL — ABNORMAL LOW (ref 0.3–1.2)
Total Protein: 7.2 g/dL (ref 6.5–8.1)

## 2021-01-24 MED ORDER — CITALOPRAM HYDROBROMIDE 10 MG PO TABS: 10.0000 mg | ORAL_TABLET | Freq: Every day | ORAL | 1 refills | Status: DC

## 2021-01-24 NOTE — Telephone Encounter (Signed)
PA for Xanax ER 1 mg approved by [facility] via CoverMYMeds thru 12//31/22.

## 2021-01-24 NOTE — Progress Notes (Signed)
[facility] CSW Progress Notes  Call to daughter [name] at request of D Clinical research associate.  Patient is experiencing significant anxiety related to her cancer diagnosis and treatment. She is in current treatment w a psychiatrist who is prescribing medications for this.  CSW provided names of several community therapists who are experienced in working w patients with serious illness, daughter will investigate these options and call CSW back w choice of therapist so referral can be facilitated as needed.  Also encouraged participation in [facility] including support groups for caregivers and patients.   Edwyna Shell, LCSW Clinical Social Worker Phone:  [phone]

## 2021-01-24 NOTE — Telephone Encounter (Signed)
I received a vm message from patient's daughter. She talked about her mom having anxiety over her diagnosis and future.  I listened as she explained. We both feel like Hannah [name] had so much going on in the beginning of her disease and treatment that she may not of had time to process. I told daughter that I would reach out to CSW for see if they can talk to them.  She was thankful for the help. Hinton Dyer

## 2021-01-25 ENCOUNTER — Encounter: Payer: Self-pay | Admitting: Internal Medicine

## 2021-01-25 ENCOUNTER — Telehealth: Payer: Self-pay | Admitting: Internal Medicine

## 2021-01-25 LAB — TSH: TSH: 0.758 u[IU]/mL (ref 0.308–3.960)

## 2021-01-25 NOTE — Telephone Encounter (Signed)
Called patient regarding upcoming scheduled appointments, patient is notified.

## 2021-01-25 NOTE — Telephone Encounter (Signed)
Pt has appt w/Dr Lemmie Evens 04-19-21. [id number]

## 2021-01-30 NOTE — Telephone Encounter (Signed)
Spoke with pt daughter, she reports the patient is having occasional racing heart with heart rate 80-100 bpm. She reports the patient is feeling better and she is okay with the appointment in October. She will call back  with problems or concerns.

## 2021-01-31 ENCOUNTER — Other Ambulatory Visit: Payer: Self-pay | Admitting: Internal Medicine

## 2021-01-31 ENCOUNTER — Other Ambulatory Visit: Payer: Self-pay

## 2021-01-31 ENCOUNTER — Other Ambulatory Visit: Payer: Medicare HMO

## 2021-01-31 ENCOUNTER — Inpatient Hospital Stay: Payer: Medicare HMO | Attending: Internal Medicine

## 2021-01-31 DIAGNOSIS — F32A Depression, unspecified: Secondary | ICD-10-CM | POA: Insufficient documentation

## 2021-01-31 DIAGNOSIS — Z8379 Family history of other diseases of the digestive system: Secondary | ICD-10-CM | POA: Insufficient documentation

## 2021-01-31 DIAGNOSIS — J90 Pleural effusion, not elsewhere classified: Secondary | ICD-10-CM | POA: Insufficient documentation

## 2021-01-31 DIAGNOSIS — Z5112 Encounter for antineoplastic immunotherapy: Secondary | ICD-10-CM | POA: Insufficient documentation

## 2021-01-31 DIAGNOSIS — F419 Anxiety disorder, unspecified: Secondary | ICD-10-CM | POA: Insufficient documentation

## 2021-01-31 DIAGNOSIS — Z79899 Other long term (current) drug therapy: Secondary | ICD-10-CM | POA: Diagnosis not present

## 2021-01-31 DIAGNOSIS — C3411 Malignant neoplasm of upper lobe, right bronchus or lung: Secondary | ICD-10-CM

## 2021-01-31 DIAGNOSIS — C7931 Secondary malignant neoplasm of brain: Secondary | ICD-10-CM | POA: Diagnosis not present

## 2021-01-31 DIAGNOSIS — Z87891 Personal history of nicotine dependence: Secondary | ICD-10-CM | POA: Diagnosis not present

## 2021-01-31 DIAGNOSIS — Z8042 Family history of malignant neoplasm of prostate: Secondary | ICD-10-CM | POA: Diagnosis not present

## 2021-01-31 DIAGNOSIS — Z823 Family history of stroke: Secondary | ICD-10-CM | POA: Diagnosis not present

## 2021-01-31 DIAGNOSIS — R509 Fever, unspecified: Secondary | ICD-10-CM | POA: Diagnosis not present

## 2021-01-31 DIAGNOSIS — Z5111 Encounter for antineoplastic chemotherapy: Secondary | ICD-10-CM | POA: Insufficient documentation

## 2021-01-31 DIAGNOSIS — Z833 Family history of diabetes mellitus: Secondary | ICD-10-CM | POA: Insufficient documentation

## 2021-01-31 DIAGNOSIS — R197 Diarrhea, unspecified: Secondary | ICD-10-CM | POA: Insufficient documentation

## 2021-01-31 DIAGNOSIS — T50B95A Adverse effect of other viral vaccines, initial encounter: Secondary | ICD-10-CM | POA: Insufficient documentation

## 2021-01-31 DIAGNOSIS — E785 Hyperlipidemia, unspecified: Secondary | ICD-10-CM | POA: Insufficient documentation

## 2021-01-31 DIAGNOSIS — I700 Atherosclerosis of aorta: Secondary | ICD-10-CM | POA: Insufficient documentation

## 2021-01-31 DIAGNOSIS — J439 Emphysema, unspecified: Secondary | ICD-10-CM | POA: Insufficient documentation

## 2021-01-31 DIAGNOSIS — Z888 Allergy status to other drugs, medicaments and biological substances status: Secondary | ICD-10-CM | POA: Diagnosis not present

## 2021-01-31 DIAGNOSIS — Z809 Family history of malignant neoplasm, unspecified: Secondary | ICD-10-CM | POA: Insufficient documentation

## 2021-01-31 DIAGNOSIS — K219 Gastro-esophageal reflux disease without esophagitis: Secondary | ICD-10-CM | POA: Insufficient documentation

## 2021-01-31 DIAGNOSIS — I10 Essential (primary) hypertension: Secondary | ICD-10-CM | POA: Diagnosis not present

## 2021-01-31 DIAGNOSIS — Z8719 Personal history of other diseases of the digestive system: Secondary | ICD-10-CM | POA: Diagnosis not present

## 2021-01-31 DIAGNOSIS — Z803 Family history of malignant neoplasm of breast: Secondary | ICD-10-CM | POA: Insufficient documentation

## 2021-01-31 LAB — CMP (CANCER CENTER ONLY)
ALT: 29 U/L (ref 0–44)
AST: 26 U/L (ref 15–41)
Albumin: 3.9 g/dL (ref 3.5–5.0)
Alkaline Phosphatase: 88 U/L (ref 38–126)
Anion gap: 11 (ref 5–15)
BUN: 10 mg/dL (ref 8–23)
CO2: 29 mmol/L (ref 22–32)
Calcium: 10.5 mg/dL — ABNORMAL HIGH (ref 8.9–10.3)
Chloride: 105 mmol/L (ref 98–111)
Creatinine: 0.88 mg/dL (ref 0.44–1.00)
GFR, Estimated: >60 mL/min (ref >60)
Glucose, Bld: 110 mg/dL — ABNORMAL HIGH (ref 70–99)
Potassium: 4 mmol/L (ref 3.5–5.1)
Sodium: 145 mmol/L (ref 135–145)
Total Bilirubin: <0.2 mg/dL — ABNORMAL LOW (ref 0.3–1.2)
Total Protein: 7.8 g/dL (ref 6.5–8.1)

## 2021-01-31 LAB — CBC WITH DIFFERENTIAL (CANCER CENTER ONLY)
Abs Immature Granulocytes: 0.02 10*3/uL (ref 0.00–0.07)
Basophils Absolute: 0 10*3/uL (ref 0.0–0.1)
Basophils Relative: 0 %
Eosinophils Absolute: 0 10*3/uL (ref 0.0–0.5)
Eosinophils Relative: 1 %
HCT: 36 % (ref 36.0–46.0)
Hemoglobin: 11.3 g/dL — ABNORMAL LOW (ref 12.0–15.0)
Immature Granulocytes: 1 %
Lymphocytes Relative: 47 %
Lymphs Abs: 1.8 10*3/uL (ref 0.7–4.0)
MCH: 27.1 pg (ref 26.0–34.0)
MCHC: 31.4 g/dL (ref 30.0–36.0)
MCV: 86.3 fL (ref 80.0–100.0)
Monocytes Absolute: 0.6 10*3/uL (ref 0.1–1.0)
Monocytes Relative: 15 %
Neutro Abs: 1.3 10*3/uL — ABNORMAL LOW (ref 1.7–7.7)
Neutrophils Relative %: 36 %
Platelet Count: 254 10*3/uL (ref 150–400)
RBC: 4.17 MIL/uL (ref 3.87–5.11)
RDW: 14.1 % (ref 11.5–15.5)
WBC Count: 3.7 10*3/uL — ABNORMAL LOW (ref 4.0–10.5)
nRBC: 0 % (ref 0.0–0.2)

## 2021-02-01 LAB — TSH: TSH: 0.474 u[IU]/mL (ref 0.308–3.960)

## 2021-02-07 ENCOUNTER — Other Ambulatory Visit: Payer: Self-pay | Admitting: Medical Oncology

## 2021-02-07 ENCOUNTER — Inpatient Hospital Stay (HOSPITAL_BASED_OUTPATIENT_CLINIC_OR_DEPARTMENT_OTHER): Payer: Medicare HMO | Admitting: Internal Medicine

## 2021-02-07 ENCOUNTER — Inpatient Hospital Stay: Payer: Medicare HMO

## 2021-02-07 ENCOUNTER — Encounter: Payer: Self-pay | Admitting: Internal Medicine

## 2021-02-07 ENCOUNTER — Other Ambulatory Visit: Payer: Self-pay

## 2021-02-07 VITALS — BP 136/79 | HR 74 | Temp 97.8°F | Resp 18 | Ht 60.0 in | Wt 119.2 lb

## 2021-02-07 DIAGNOSIS — J439 Emphysema, unspecified: Secondary | ICD-10-CM | POA: Diagnosis not present

## 2021-02-07 DIAGNOSIS — C3411 Malignant neoplasm of upper lobe, right bronchus or lung: Secondary | ICD-10-CM | POA: Diagnosis not present

## 2021-02-07 DIAGNOSIS — E785 Hyperlipidemia, unspecified: Secondary | ICD-10-CM | POA: Diagnosis not present

## 2021-02-07 DIAGNOSIS — Z5112 Encounter for antineoplastic immunotherapy: Secondary | ICD-10-CM | POA: Diagnosis not present

## 2021-02-07 DIAGNOSIS — I700 Atherosclerosis of aorta: Secondary | ICD-10-CM | POA: Diagnosis not present

## 2021-02-07 DIAGNOSIS — C7931 Secondary malignant neoplasm of brain: Secondary | ICD-10-CM | POA: Diagnosis not present

## 2021-02-07 DIAGNOSIS — Z5111 Encounter for antineoplastic chemotherapy: Secondary | ICD-10-CM

## 2021-02-07 DIAGNOSIS — J90 Pleural effusion, not elsewhere classified: Secondary | ICD-10-CM | POA: Diagnosis not present

## 2021-02-07 DIAGNOSIS — I10 Essential (primary) hypertension: Secondary | ICD-10-CM | POA: Diagnosis not present

## 2021-02-07 LAB — CBC WITH DIFFERENTIAL (CANCER CENTER ONLY)
Abs Immature Granulocytes: 0.01 10*3/uL (ref 0.00–0.07)
Basophils Absolute: 0 10*3/uL (ref 0.0–0.1)
Basophils Relative: 1 %
Eosinophils Absolute: 0 10*3/uL (ref 0.0–0.5)
Eosinophils Relative: 1 %
HCT: 36.2 % (ref 36.0–46.0)
Hemoglobin: 11.4 g/dL — ABNORMAL LOW (ref 12.0–15.0)
Immature Granulocytes: 0 %
Lymphocytes Relative: 38 %
Lymphs Abs: 1.3 10*3/uL (ref 0.7–4.0)
MCH: 26.8 pg (ref 26.0–34.0)
MCHC: 31.5 g/dL (ref 30.0–36.0)
MCV: 85.2 fL (ref 80.0–100.0)
Monocytes Absolute: 0.4 10*3/uL (ref 0.1–1.0)
Monocytes Relative: 12 %
Neutro Abs: 1.6 10*3/uL — ABNORMAL LOW (ref 1.7–7.7)
Neutrophils Relative %: 48 %
Platelet Count: 253 10*3/uL (ref 150–400)
RBC: 4.25 MIL/uL (ref 3.87–5.11)
RDW: 14.2 % (ref 11.5–15.5)
WBC Count: 3.4 10*3/uL — ABNORMAL LOW (ref 4.0–10.5)
nRBC: 0 % (ref 0.0–0.2)

## 2021-02-07 LAB — CMP (CANCER CENTER ONLY)
ALT: 25 U/L (ref 0–44)
AST: 27 U/L (ref 15–41)
Albumin: 3.8 g/dL (ref 3.5–5.0)
Alkaline Phosphatase: 80 U/L (ref 38–126)
Anion gap: 10 (ref 5–15)
BUN: 10 mg/dL (ref 8–23)
CO2: 26 mmol/L (ref 22–32)
Calcium: 10.4 mg/dL — ABNORMAL HIGH (ref 8.9–10.3)
Chloride: 107 mmol/L (ref 98–111)
Creatinine: 0.71 mg/dL (ref 0.44–1.00)
GFR, Estimated: >60 mL/min (ref >60)
Glucose, Bld: 122 mg/dL — ABNORMAL HIGH (ref 70–99)
Potassium: 4.1 mmol/L (ref 3.5–5.1)
Sodium: 143 mmol/L (ref 135–145)
Total Bilirubin: 0.2 mg/dL — ABNORMAL LOW (ref 0.3–1.2)
Total Protein: 7.3 g/dL (ref 6.5–8.1)

## 2021-02-07 MED ORDER — SODIUM CHLORIDE 0.9 % IV SOLN
500.0000 mg/m2 | Freq: Once | INTRAVENOUS | Status: AC
  Administered 2021-02-07: 700 mg via INTRAVENOUS
  Filled 2021-02-07: qty 20

## 2021-02-07 MED ORDER — SODIUM CHLORIDE 0.9 % IV SOLN
Freq: Once | INTRAVENOUS | Status: AC
  Filled 2021-02-07: qty 250

## 2021-02-07 MED ORDER — SODIUM CHLORIDE 0.9 % IV SOLN
200.0000 mg | Freq: Once | INTRAVENOUS | Status: AC
  Administered 2021-02-07: 200 mg via INTRAVENOUS
  Filled 2021-02-07: qty 8

## 2021-02-07 MED ORDER — PROCHLORPERAZINE MALEATE 10 MG PO TABS
10.0000 mg | ORAL_TABLET | Freq: Once | ORAL | Status: AC
  Administered 2021-02-07: 10 mg via ORAL
  Filled 2021-02-07: qty 1

## 2021-02-07 NOTE — Progress Notes (Signed)
[facility] [phone]   Fax:([phone]  OFFICE PROGRESS NOTE  [name], MD [place] [address]  DIAGNOSIS: Stage IV non-small cell lung cancer (T, N0, M1C) adenocarcinoma.  The patient presented with a and solitary brain metastasis.  The patient was diagnosed in February 2022.  Biomarker Findings Microsatellite status - MS-Stable Tumor Mutational Burden - 8 Muts/Mb Genomic Findings For a complete list of the genes assayed, please refer to the Appendix. KRAS G12V KEAP1 S224F TP53 P151T 7 Disease relevant genes with no reportable alterations: ALK, BRAF, EGFR, ERBB2, MET, RET, ROS1  PDL1 Expression: 50%    PRIOR THERAPY: 1) SRS to the metastatic brain lesion on 08/23/2020 under the care of Dr. Tammi Klippel and craniotomy under the care of Dr. Zada Finders scheduled for 08/24/2020. 2) S/p robotic assisted right upper lobectomy with en bloc wedge resection of the right middle lobe and lymph node dissection under the care of Dr. Roxan Hockey on September 24, 2020. 3) status post SRS to a new subcentimeter brain lesions under the care of Dr. Lisbeth Renshaw.   CURRENT THERAPY: Systemic chemotherapy with carboplatin for AUC of 5, Alimta 500 Mg/M2 and Keytruda 200 mg IV every 3 weeks.  First dose Nov 07, 2020.  Status post 4 cycles.  Starting from cycle #5 the patient will be on maintenance treatment with Alimta and Keytruda every 3 weeks.   INTERVAL HISTORY: Hannah [name] 70 y.o. female returns to the clinic today for follow-up visit accompanied by her daughter.  The patient is feeling fine today with no concerning complaints except for occasional sore throat improved spontaneously.  She also has intermittent right neck pain.  She denied having any current chest pain, shortness of breath, cough or hemoptysis.  She denied having any fever or chills.  She has no nausea, vomiting, diarrhea or constipation.  She has no headache or visual changes.  She  has no recent weight loss or night sweats.  She is here today for evaluation before starting cycle #5 of her treatment.   MEDICAL HISTORY: Past Medical History:  Diagnosis Date   Allergy    Anemia    Anxiety    Arthritis    Carpal tunnel syndrome    Constipation    senna C stool softeners help    Depression    Diverticulosis    Dyslipidemia    External hemorrhoids    GERD (gastroesophageal reflux disease)    Heart murmur    mild-moderate AR   Hiatal hernia    Hyperlipidemia    on meds    Hypertension    Internal hemorrhoids    lung ca with brain mets 07/2020   Osteoarthritis    Pre-diabetes    PVD (peripheral vascular disease) (HCC)    moderate carotid disease   RBBB    Smoker    Vocal cord polyps     ALLERGIES:  is allergic to amlodipine, chantix [varenicline tartrate], clarithromycin, lisinopril, simvastatin, wellbutrin [bupropion hcl], lipitor [atorvastatin], and sertraline.  MEDICATIONS:  Current Outpatient Medications  Medication Sig Dispense Refill   acetaminophen (TYLENOL) 500 MG tablet Take 2 tablets (1,000 mg total) by mouth every 6 (six) hours. 30 tablet 0   ALPRAZolam (XANAX XR) 1 MG 24 hr tablet 1  qhs 30 tablet 4   ALPRAZolam (XANAX) 0.5 MG tablet 2  qam   2  _0 ;00 120 tablet 5   Ascorbic Acid (VITAMIN C) 1000 MG tablet Take 1,000 mg  by mouth daily.     aspirin 81 MG tablet Take 1 tablet (81 mg total) by mouth daily. Restart on 08/31/20 30 tablet    carboxymethylcellulose (REFRESH PLUS) 0.5 % SOLN Place 1-2 drops into both eyes daily.     citalopram (CELEXA) 10 MG tablet Take 1 tablet (10 mg total) by mouth daily. 30 tablet 1   folic acid (FOLVITE) 1 MG tablet Take 1 tablet (1 mg total) by mouth daily. 30 tablet 4   hydrochlorothiazide (HYDRODIURIL) 12.5 MG tablet Take 1 tablet (12.5 mg total) by mouth daily. (Patient not taking: Reported on 01/17/2021) 90 tablet 1   ketoconazole (NIZORAL) 2 % cream Apply 1 application topically 2 (two) times daily. 15 g 2    loratadine (CLARITIN) 10 MG tablet Take 10 mg by mouth daily as needed for allergies.     mirtazapine (REMERON) 45 MG tablet Take 1 tablet (45 mg total) by mouth at bedtime. 30 tablet 3   neomycin-bacitracin-polymyxin (NEOSPORIN) ointment Apply 1 application topically as needed for wound care.     omeprazole (PRILOSEC) 40 MG capsule TAKE 1 CAPSULE (40 MG TOTAL) BY MOUTH 2 (TWO) TIMES DAILY BEFORE A MEAL. 180 capsule 0   polyethylene glycol (MIRALAX / GLYCOLAX) 17 g packet Take 17 g by mouth 2 (two) times daily. 72 each 0   pravastatin (PRAVACHOL) 40 MG tablet TAKE 1 TABLET (40 MG TOTAL) BY MOUTH EVERY EVENING. 90 tablet 0   Probiotic Product (PROBIOTIC DAILY PO) Take 1 capsule by mouth daily.     prochlorperazine (COMPAZINE) 10 MG tablet Take 1 tablet (10 mg total) by mouth every 6 (six) hours as needed for nausea or vomiting. (Patient not taking: Reported on 01/17/2021) 30 tablet 0   senna-docusate (SENOKOT-S) 8.6-50 MG tablet Take 1 tablet by mouth 2 (two) times daily. (Patient taking differently: Take 1 tablet by mouth daily as needed for mild constipation.)     simethicone (MYLICON) 80 MG chewable tablet Chew 1 tablet (80 mg total) by mouth 4 (four) times daily as needed for flatulence (Bloating). 30 tablet 0   Wheat Dextrin (BENEFIBER) POWD Take 1 Scoop by mouth 2 (two) times daily as needed (constipation). (Patient not taking: Reported on 01/17/2021)     No current facility-administered medications for this visit.    SURGICAL HISTORY:  Past Surgical History:  Procedure Laterality Date   ABDOMINAL HYSTERECTOMY  6333   APPLICATION OF CRANIAL NAVIGATION N/A 08/24/2020   Procedure: APPLICATION OF CRANIAL NAVIGATION;  Surgeon: Judith Part, MD;  Location: [facility];  Service: Neurosurgery;  Laterality: N/A;   COLONOSCOPY     COLONOSCOPY W/ POLYPECTOMY  2009   CRANIOTOMY Left 08/24/2020   Procedure: Left Craniotomy for Tumor Resection with Brainlab;  Surgeon: Judith Part, MD;   Location: [facility];  Service: Neurosurgery;  Laterality: Left;   HEMORRHOID SURGERY     INTERCOSTAL NERVE BLOCK Right 09/24/2020   Procedure: INTERCOSTAL NERVE BLOCK;  Surgeon: Melrose Nakayama, MD;  Location: [facility];  Service: Thoracic;  Laterality: Right;   JOINT REPLACEMENT     LYMPH NODE DISSECTION Right 09/24/2020   Procedure: LYMPH NODE DISSECTION;  Surgeon: Melrose Nakayama, MD;  Location: [facility];  Service: Thoracic;  Laterality: Right;   POLYPECTOMY     right total knee arthroplasty     Dr. Emeterio Reeve 06-04-18   [facility]  08/16/2009   TOTAL KNEE ARTHROPLASTY Left 02/13/2014   Procedure: TOTAL KNEE ARTHROPLASTY;  Surgeon: Alta Corning, MD;  Location: [facility];  Service: Orthopedics;  Laterality: Left;   TOTAL KNEE ARTHROPLASTY Right 06/04/2018   Procedure: RIGHT TOTAL KNEE ARTHROPLASTY;  Surgeon: Dorna Leitz, MD;  Location: [facility];  Service: Orthopedics;  Laterality: Right;  Adductor Block   TUBAL LIGATION      REVIEW OF SYSTEMS:  A comprehensive review of systems was negative except for: Ears, nose, mouth, throat, and face: positive for sore throat Musculoskeletal: positive for neck pain   PHYSICAL EXAMINATION: General appearance: alert, cooperative, and no distress Head: Normocephalic, without obvious abnormality, atraumatic Neck: no adenopathy, no JVD, supple, symmetrical, trachea midline, and thyroid not enlarged, symmetric, no tenderness/mass/nodules Lymph nodes: Cervical, supraclavicular, and axillary nodes normal. Resp: clear to auscultation bilaterally Back: symmetric, no curvature. ROM normal. No CVA tenderness. Cardio: regular rate and rhythm, S1, S2 normal, no murmur, click, rub or gallop GI: soft, non-tender; bowel sounds normal; no masses,  no organomegaly Extremities: extremities normal, atraumatic, no cyanosis or edema  ECOG PERFORMANCE STATUS: 1 - Symptomatic but completely ambulatory  Blood pressure 136/79, pulse 74, temperature 97.8 F (36.6 C), temperature  source Tympanic, resp. rate 18, height 5' (1.524 m), weight 119 lb 3.2 oz (54.1 kg), SpO2 100 %.  LABORATORY DATA: Lab Results  Component Value Date   WBC 3.4 (L) 02/07/2021   HGB 11.4 (L) 02/07/2021   HCT 36.2 02/07/2021   MCV 85.2 02/07/2021   PLT 253 02/07/2021      Chemistry      Component Value Date/Time   NA 145 01/31/2021 1555   K 4.0 01/31/2021 1555   CL 105 01/31/2021 1555   CO2 29 01/31/2021 1555   BUN 10 01/31/2021 1555   CREATININE 0.88 01/31/2021 1555   CREATININE 0.79 03/14/2020 1016      Component Value Date/Time   CALCIUM 10.5 (H) 01/31/2021 1555   ALKPHOS 88 01/31/2021 1555   AST 26 01/31/2021 1555   ALT 29 01/31/2021 1555   BILITOT <0.2 (L) 01/31/2021 1555       RADIOGRAPHIC STUDIES: CT Chest W Contrast  Result Date: 01/16/2021 CLINICAL DATA:  Primary Cancer Type: Lung Imaging Indication: Assess response to therapy Interval therapy since last imaging? Yes Initial Cancer Diagnosis Date: 09/24/2020; Established by: Biopsy-proven Detailed Pathology: Stage IV non-small cell lung cancer, adenocarcinoma. Primary Tumor location: Right upper lobe.  Brain metastasis. Surgeries: Right upper lobectomy 09/24/2020. Left frontal craniotomy 08/24/2020. Hysterectomy. Chemotherapy: Yes; Ongoing? Yes; Most recent administration: 12/26/2020 Immunotherapy?  Yes; Type: Keytruda; Ongoing? Yes Radiation therapy? Yes; Date Range: 08/23/2020 and 11/29/2020; Target: Brain EXAM: CT CHEST, ABDOMEN, AND PELVIS WITH CONTRAST TECHNIQUE: Multidetector CT imaging of the chest, abdomen and pelvis was performed following the standard protocol during bolus administration of intravenous contrast. CONTRAST:  64m OMNIPAQUE IOHEXOL 350 MG/ML SOLN COMPARISON:  Most recent CT abdomen and pelvis 08/17/2020. 08/08/2020 PET-CT. 08/02/2020 CT chest, abdomen and pelvis. FINDINGS: CT CHEST FINDINGS Cardiovascular: The heart is normal in size. No pericardial effusion. There is mild tortuosity, ectasia and  calcification of the thoracic aorta but no dissection. The branch vessels are patent. Stable scattered coronary artery calcifications. Mediastinum/Nodes: No mediastinal or hilar mass or lymphadenopathy. The esophagus is grossly normal. Lungs/Pleura: Surgical changes from a right upper lobe lobectomy. No findings suspicious for residual or recurrent tumor. No pulmonary nodules to suggest pulmonary metastatic disease. Stable underlying emphysematous changes. Small right-sided pleural effusion. Musculoskeletal: No significant bony findings. CT ABDOMEN PELVIS FINDINGS Hepatobiliary: No hepatic lesions to suggest metastatic disease. No intrahepatic biliary dilatation. The gallbladder is  unremarkable. No common bile duct dilatation. Pancreas: No mass, inflammation or ductal dilatation. Spleen: Normal size.  No focal lesions. Adrenals/Urinary Tract: Adrenal glands are normal. No renal lesions or hydronephrosis.  The bladder is unremarkable. Stomach/Bowel: Stomach, duodenum, small bowel and colon are unremarkable. No acute inflammatory changes, mass lesions or obstructive findings. The terminal ileum is normal. The appendix is normal. Vascular/Lymphatic: Advanced atherosclerotic calcifications involving the distal aorta and iliac arteries but no aneurysm or dissection. The branch vessels are patent. The major venous structures are patent. No mesenteric or retroperitoneal mass or adenopathy. Reproductive: Surgically absent. Other: No pelvic mass or adenopathy. No free pelvic fluid collections. No inguinal mass or adenopathy. No abdominal wall hernia or subcutaneous lesions. Musculoskeletal: No worrisome lytic or sclerotic bone lesions to suggest metastatic disease. Stable osteoporosis and degenerative changes involving the spine. Remote postsurgical changes involving the lumbar spine. Stable degenerative changes involving both hips. IMPRESSION: 1. Surgical changes from a right upper lobe lobectomy. No findings suspicious for  residual or recurrent tumor. 2. No mediastinal or hilar mass or adenopathy. 3. Stable emphysematous changes. 4. Small right-sided pleural effusion. 5. No findings for abdominal/pelvic or osseous metastatic disease. 6. Advanced atherosclerotic calcifications involving the thoracic and abdominal aorta and iliac arteries. 7. Emphysema and aortic atherosclerosis. Aortic Atherosclerosis (ICD10-I70.0) and Emphysema (ICD10-J43.9). Electronically Signed   By: Marijo Sanes M.D.   On: 01/16/2021 11:32   CT Abdomen Pelvis W Contrast  Result Date: 01/16/2021 CLINICAL DATA:  Primary Cancer Type: Lung Imaging Indication: Assess response to therapy Interval therapy since last imaging? Yes Initial Cancer Diagnosis Date: 09/24/2020; Established by: Biopsy-proven Detailed Pathology: Stage IV non-small cell lung cancer, adenocarcinoma. Primary Tumor location: Right upper lobe.  Brain metastasis. Surgeries: Right upper lobectomy 09/24/2020. Left frontal craniotomy 08/24/2020. Hysterectomy. Chemotherapy: Yes; Ongoing? Yes; Most recent administration: 12/26/2020 Immunotherapy?  Yes; Type: Keytruda; Ongoing? Yes Radiation therapy? Yes; Date Range: 08/23/2020 and 11/29/2020; Target: Brain EXAM: CT CHEST, ABDOMEN, AND PELVIS WITH CONTRAST TECHNIQUE: Multidetector CT imaging of the chest, abdomen and pelvis was performed following the standard protocol during bolus administration of intravenous contrast. CONTRAST:  57mL OMNIPAQUE IOHEXOL 350 MG/ML SOLN COMPARISON:  Most recent CT abdomen and pelvis 08/17/2020. 08/08/2020 PET-CT. 08/02/2020 CT chest, abdomen and pelvis. FINDINGS: CT CHEST FINDINGS Cardiovascular: The heart is normal in size. No pericardial effusion. There is mild tortuosity, ectasia and calcification of the thoracic aorta but no dissection. The branch vessels are patent. Stable scattered coronary artery calcifications. Mediastinum/Nodes: No mediastinal or hilar mass or lymphadenopathy. The esophagus is grossly normal.  Lungs/Pleura: Surgical changes from a right upper lobe lobectomy. No findings suspicious for residual or recurrent tumor. No pulmonary nodules to suggest pulmonary metastatic disease. Stable underlying emphysematous changes. Small right-sided pleural effusion. Musculoskeletal: No significant bony findings. CT ABDOMEN PELVIS FINDINGS Hepatobiliary: No hepatic lesions to suggest metastatic disease. No intrahepatic biliary dilatation. The gallbladder is unremarkable. No common bile duct dilatation. Pancreas: No mass, inflammation or ductal dilatation. Spleen: Normal size.  No focal lesions. Adrenals/Urinary Tract: Adrenal glands are normal. No renal lesions or hydronephrosis.  The bladder is unremarkable. Stomach/Bowel: Stomach, duodenum, small bowel and colon are unremarkable. No acute inflammatory changes, mass lesions or obstructive findings. The terminal ileum is normal. The appendix is normal. Vascular/Lymphatic: Advanced atherosclerotic calcifications involving the distal aorta and iliac arteries but no aneurysm or dissection. The branch vessels are patent. The major venous structures are patent. No mesenteric or retroperitoneal mass or adenopathy. Reproductive: Surgically absent. Other: No pelvic mass or adenopathy.  No free pelvic fluid collections. No inguinal mass or adenopathy. No abdominal wall hernia or subcutaneous lesions. Musculoskeletal: No worrisome lytic or sclerotic bone lesions to suggest metastatic disease. Stable osteoporosis and degenerative changes involving the spine. Remote postsurgical changes involving the lumbar spine. Stable degenerative changes involving both hips. IMPRESSION: 1. Surgical changes from a right upper lobe lobectomy. No findings suspicious for residual or recurrent tumor. 2. No mediastinal or hilar mass or adenopathy. 3. Stable emphysematous changes. 4. Small right-sided pleural effusion. 5. No findings for abdominal/pelvic or osseous metastatic disease. 6. Advanced  atherosclerotic calcifications involving the thoracic and abdominal aorta and iliac arteries. 7. Emphysema and aortic atherosclerosis. Aortic Atherosclerosis (ICD10-I70.0) and Emphysema (ICD10-J43.9). Electronically Signed   By: Marijo Sanes M.D.   On: 01/16/2021 11:32     ASSESSMENT AND PLAN: This is a very pleasant 70 years old African-American female diagnosed with a stage IV (T3, N0, M1b) non-small cell lung cancer, adenocarcinoma presented with right upper lobe lung mass with solitary brain metastasis diagnosed in February 2022 status post SRS to the brain lesion followed by craniotomy and resection. The patient also has a solitary lung mass. S/p robotic assisted right upper lobectomy with en bloc wedge resection of the right middle lobe and lymph node dissection under the care of Dr. Roxan Hockey on September 24, 2020. She also underwent SRS treatment to a new subcentimeter brain lesions under the care of Dr. Lisbeth Renshaw She is currently undergoing systemic chemotherapy with carboplatin for AUC of 5, Alimta 500 Mg/M2 and Keytruda 200 Mg IV every 3 weeks status post 4 cycles.  Starting from cycle #5 the patient will be on maintenance treatment with Alimta and Keytruda every 3 weeks. She tolerated the last cycle of her treatment well with no concerning adverse effects. I recommended for her to proceed with cycle #5 today as planned. She will come back for follow-up visit in 3 weeks for evaluation before starting the next cycle of her treatment. The patient was advised to call immediately if she has any concerning symptoms in the interval. The patient voices understanding of current disease status and treatment options and is in agreement with the current care plan.  All questions were answered. The patient knows to call the clinic with any problems, questions or concerns. We can certainly see the patient much sooner if necessary.  Disclaimer: This note was dictated with voice recognition software. Similar  sounding words can inadvertently be transcribed and may not be corrected upon review.

## 2021-02-07 NOTE — Patient Instructions (Signed)
[facility]  Discharge Instructions: Thank you for choosing [facility] to provide your oncology and hematology care.   If you have a lab appointment with the [facility], please go directly to the [facility] and check in at the registration area.   Wear comfortable clothing and clothing appropriate for easy access to any Portacath or PICC line.   We strive to give you quality time with your provider. You may need to reschedule your appointment if you arrive late (15 or more minutes).  Arriving late affects you and other patients whose appointments are after yours.  Also, if you miss three or more appointments without notifying the office, you may be dismissed from the clinic at the provider's discretion.      For prescription refill requests, have your pharmacy contact our office and allow 72 hours for refills to be completed.    Today you received the following chemotherapy and/or immunotherapy agents: Keytruda, Alimta   To help prevent nausea and vomiting after your treatment, we encourage you to take your nausea medication as directed.  BELOW ARE SYMPTOMS THAT SHOULD BE REPORTED IMMEDIATELY: *FEVER GREATER THAN 100.4 F (38 C) OR HIGHER *CHILLS OR SWEATING *NAUSEA AND VOMITING THAT IS NOT CONTROLLED WITH YOUR NAUSEA MEDICATION *UNUSUAL SHORTNESS OF BREATH *UNUSUAL BRUISING OR BLEEDING *URINARY PROBLEMS (pain or burning when urinating, or frequent urination) *BOWEL PROBLEMS (unusual diarrhea, constipation, pain near the anus) TENDERNESS IN MOUTH AND THROAT WITH OR WITHOUT PRESENCE OF ULCERS (sore throat, sores in mouth, or a toothache) UNUSUAL RASH, SWELLING OR PAIN  UNUSUAL VAGINAL DISCHARGE OR ITCHING   Items with * indicate a potential emergency and should be followed up as soon as possible or go to the Emergency Department if any problems should occur.  Please show the CHEMOTHERAPY ALERT CARD or IMMUNOTHERAPY ALERT CARD at check-in  to the Emergency Department and triage nurse.  Should you have questions after your visit or need to cancel or reschedule your appointment, please contact [facility]  Dept: [phone]  and follow the prompts.  Office hours are 8:00 a.m. to 4:30 p.m. Monday - Friday. Please note that voicemails left after 4:00 p.m. may not be returned until the following business day.  We are closed weekends and major holidays. You have access to a nurse at all times for urgent questions. Please call the main number to the clinic Dept: [phone] and follow the prompts.   For any non-urgent questions, you may also contact your provider using MyChart. We now offer e-Visits for anyone 19 and older to request care online for non-urgent symptoms. For details visit [email or web address].   Also download the MyChart app! Go to the app store, search "MyChart", open the app, select [facility], and log in with your MyChart username and password.  Due to Covid, a mask is required upon entering the hospital/clinic. If you do not have a mask, one will be given to you upon arrival. For doctor visits, patients may have 1 support person aged 1 or older with them. For treatment visits, patients cannot have anyone with them due to current Covid guidelines and our immunocompromised population.

## 2021-02-12 ENCOUNTER — Emergency Department (HOSPITAL_COMMUNITY): Payer: Medicare HMO

## 2021-02-12 ENCOUNTER — Other Ambulatory Visit: Payer: Self-pay

## 2021-02-12 ENCOUNTER — Emergency Department (HOSPITAL_COMMUNITY)
Admission: EM | Admit: 2021-02-12 | Discharge: 2021-02-13 | Disposition: A | Discharge status: Home / Self Care | Payer: Medicare HMO | Attending: Emergency Medicine | Admitting: Emergency Medicine

## 2021-02-12 ENCOUNTER — Encounter (HOSPITAL_COMMUNITY): Payer: Self-pay

## 2021-02-12 DIAGNOSIS — R519 Headache, unspecified: Secondary | ICD-10-CM | POA: Insufficient documentation

## 2021-02-12 DIAGNOSIS — Z96653 Presence of artificial knee joint, bilateral: Secondary | ICD-10-CM | POA: Diagnosis not present

## 2021-02-12 DIAGNOSIS — Z20822 Contact with and (suspected) exposure to covid-19: Secondary | ICD-10-CM | POA: Diagnosis not present

## 2021-02-12 DIAGNOSIS — R0981 Nasal congestion: Secondary | ICD-10-CM | POA: Insufficient documentation

## 2021-02-12 DIAGNOSIS — R791 Abnormal coagulation profile: Secondary | ICD-10-CM | POA: Insufficient documentation

## 2021-02-12 DIAGNOSIS — I10 Essential (primary) hypertension: Secondary | ICD-10-CM | POA: Insufficient documentation

## 2021-02-12 DIAGNOSIS — C7931 Secondary malignant neoplasm of brain: Secondary | ICD-10-CM | POA: Diagnosis not present

## 2021-02-12 DIAGNOSIS — R059 Cough, unspecified: Secondary | ICD-10-CM | POA: Insufficient documentation

## 2021-02-12 DIAGNOSIS — R509 Fever, unspecified: Secondary | ICD-10-CM | POA: Diagnosis not present

## 2021-02-12 DIAGNOSIS — Z87891 Personal history of nicotine dependence: Secondary | ICD-10-CM | POA: Diagnosis not present

## 2021-02-12 LAB — COMPREHENSIVE METABOLIC PANEL
ALT: 27 U/L (ref 0–44)
AST: 32 U/L (ref 15–41)
Albumin: 4.1 g/dL (ref 3.5–5.0)
Alkaline Phosphatase: 68 U/L (ref 38–126)
Anion gap: 11 (ref 5–15)
BUN: 15 mg/dL (ref 8–23)
CO2: 25 mmol/L (ref 22–32)
Calcium: 9.8 mg/dL (ref 8.9–10.3)
Chloride: 98 mmol/L (ref 98–111)
Creatinine, Ser: 0.79 mg/dL (ref 0.44–1.00)
GFR, Estimated: >60 mL/min (ref >60)
Glucose, Bld: 114 mg/dL — ABNORMAL HIGH (ref 70–99)
Potassium: 3.8 mmol/L (ref 3.5–5.1)
Sodium: 134 mmol/L — ABNORMAL LOW (ref 135–145)
Total Bilirubin: 0.6 mg/dL (ref 0.3–1.2)
Total Protein: 7.9 g/dL (ref 6.5–8.1)

## 2021-02-12 LAB — URINALYSIS, ROUTINE W REFLEX MICROSCOPIC
Bacteria, UA: NONE SEEN
Bilirubin Urine: NEGATIVE
Glucose, UA: NEGATIVE mg/dL
Hgb urine dipstick: NEGATIVE
Ketones, ur: 5 mg/dL — AB
Nitrite: NEGATIVE
Protein, ur: NEGATIVE mg/dL
Specific Gravity, Urine: 1.018 (ref 1.005–1.030)
pH: 6 (ref 5.0–8.0)

## 2021-02-12 LAB — PROTIME-INR
INR: 1 (ref 0.8–1.2)
Prothrombin Time: 12.8 seconds (ref 11.4–15.2)

## 2021-02-12 LAB — CBC WITH DIFFERENTIAL/PLATELET
Abs Immature Granulocytes: 0.01 10*3/uL (ref 0.00–0.07)
Basophils Absolute: 0 10*3/uL (ref 0.0–0.1)
Basophils Relative: 1 %
Eosinophils Absolute: 0 10*3/uL (ref 0.0–0.5)
Eosinophils Relative: 1 %
HCT: 35 % — ABNORMAL LOW (ref 36.0–46.0)
Hemoglobin: 10.9 g/dL — ABNORMAL LOW (ref 12.0–15.0)
Immature Granulocytes: 0 %
Lymphocytes Relative: 29 %
Lymphs Abs: 1.1 10*3/uL (ref 0.7–4.0)
MCH: 27 pg (ref 26.0–34.0)
MCHC: 31.1 g/dL (ref 30.0–36.0)
MCV: 86.6 fL (ref 80.0–100.0)
Monocytes Absolute: 0.2 10*3/uL (ref 0.1–1.0)
Monocytes Relative: 5 %
Neutro Abs: 2.5 10*3/uL (ref 1.7–7.7)
Neutrophils Relative %: 64 %
Platelets: 163 10*3/uL (ref 150–400)
RBC: 4.04 MIL/uL (ref 3.87–5.11)
RDW: 13.9 % (ref 11.5–15.5)
WBC: 3.8 10*3/uL — ABNORMAL LOW (ref 4.0–10.5)
nRBC: 0 % (ref 0.0–0.2)

## 2021-02-12 LAB — APTT: aPTT: 38 seconds — ABNORMAL HIGH (ref 24–36)

## 2021-02-12 NOTE — ED Notes (Signed)
Pt ambulatory in ED lobby. 

## 2021-02-12 NOTE — ED Notes (Signed)
I have informed this pt and her daughter of the ED visitor policy.

## 2021-02-12 NOTE — ED Provider Notes (Signed)
Emergency Medicine Provider Triage Evaluation Note  Hannah [name] , a 70 y.o. female  was evaluated in triage.  Pt complains of fever tonight. Reports temp of 101 at home. No antipyretics PTA. Associated chills, congestion, sore throat. Receiving chemo for lung cancer- called office number and told to come to the ED. Has been feeling poorly since her shingles shot at the end of July.   Review of Systems  Positive: Fever, congestion, chills, sore throat Negative: Chest pain, dyspnea, N/V/D, dysuria.   Physical Exam  BP 117/73 (BP Location: Left Arm)   Pulse 94   Temp 99.7 F (37.6 C) (Oral)   Resp 18   Ht 5' (1.524 m)   Wt 52.2 kg   SpO2 95%   BMI 22.46 kg/m  Gen:   Awake, no distress   Resp:  Normal effort  MSK:   Moves extremities without difficulty   Medical Decision Making  Medically screening exam initiated at 8:49 PM.  Appropriate orders placed.  [name] was informed that the remainder of the evaluation will be completed by another provider, this initial triage assessment does not replace that evaluation, and the importance of remaining in the ED until their evaluation is complete.  Chills.    Leafy Kindle 02/12/21 2050    Charlesetta Shanks, MD 03/03/21 1705

## 2021-02-12 NOTE — ED Triage Notes (Signed)
Pt reports frontal headache for several days. Had a fever at home of 101 but denies taking any meds. Afebrile here. Hx lung cancer and last chemo tx was on Wednesday.

## 2021-02-13 ENCOUNTER — Emergency Department (HOSPITAL_COMMUNITY): Payer: Medicare HMO

## 2021-02-13 DIAGNOSIS — C7931 Secondary malignant neoplasm of brain: Secondary | ICD-10-CM | POA: Diagnosis not present

## 2021-02-13 LAB — RESP PANEL BY RT-PCR (FLU A&B, COVID) ARPGX2
Influenza A by PCR: NEGATIVE
Influenza B by PCR: NEGATIVE
SARS Coronavirus 2 by RT PCR: NEGATIVE

## 2021-02-13 NOTE — Discharge Instructions (Addendum)
You were evaluated in the Emergency Department and after careful evaluation, we did not find any emergent condition requiring admission or further testing in the hospital.  Your exam/testing today was overall reassuring.  Recommend close follow-up with your oncology team to discuss your symptoms.  Please return to the Emergency Department if you experience any worsening of your condition.  Thank you for allowing [place] to be a part of your care.

## 2021-02-13 NOTE — ED Provider Notes (Signed)
[facility] Emergency Department Provider Note MRN:  [id number]  Arrival date & time: 02/13/21     Chief Complaint   Headache   History of Present Illness   Hannah [name] is a 70 y.o. year-old female with a history of stage IV non-small cell lung cancer presenting to the ED with chief complaint of headache.  Patient is endorsing headache, nasal congestion, cough, today with fever up to 101 at home.  Denies chest pain or shortness of breath, no abdominal pain, no rash, no dysuria, no hematuria, no nausea vomiting or diarrhea.  On chemotherapy, advised to come to the hospital.  Symptoms are constant, mild, no exacerbating or alleviating factors.  Review of Systems  A complete 10 system review of systems was obtained and all systems are negative except as noted in the HPI and PMH.   Patient's Health History    Past Medical History:  Diagnosis Date   Allergy    Anemia    Anxiety    Arthritis    Carpal tunnel syndrome    Constipation    senna C stool softeners help    Depression    Diverticulosis    Dyslipidemia    External hemorrhoids    GERD (gastroesophageal reflux disease)    Heart murmur    mild-moderate AR   Hiatal hernia    Hyperlipidemia    on meds    Hypertension    Internal hemorrhoids    lung ca with brain mets 07/2020   Osteoarthritis    Pre-diabetes    PVD (peripheral vascular disease) (HCC)    moderate carotid disease   RBBB    Smoker    Vocal cord polyps     Past Surgical History:  Procedure Laterality Date   ABDOMINAL HYSTERECTOMY  7425   APPLICATION OF CRANIAL NAVIGATION N/A 08/24/2020   Procedure: APPLICATION OF CRANIAL NAVIGATION;  Surgeon: Judith Part, MD;  Location: [place];  Service: Neurosurgery;  Laterality: N/A;   COLONOSCOPY     COLONOSCOPY W/ POLYPECTOMY  2009   CRANIOTOMY Left 08/24/2020   Procedure: Left Craniotomy for Tumor Resection with Brainlab;  Surgeon: Judith Part, MD;  Location: [place];   Service: Neurosurgery;  Laterality: Left;   HEMORRHOID SURGERY     INTERCOSTAL NERVE BLOCK Right 09/24/2020   Procedure: INTERCOSTAL NERVE BLOCK;  Surgeon: Melrose Nakayama, MD;  Location: [place];  Service: Thoracic;  Laterality: Right;   JOINT REPLACEMENT     LYMPH NODE DISSECTION Right 09/24/2020   Procedure: LYMPH NODE DISSECTION;  Surgeon: Melrose Nakayama, MD;  Location: [place];  Service: Thoracic;  Laterality: Right;   POLYPECTOMY     right total knee arthroplasty     Dr. Emeterio Reeve 06-04-18   [place]  08/16/2009   TOTAL KNEE ARTHROPLASTY Left 02/13/2014   Procedure: TOTAL KNEE ARTHROPLASTY;  Surgeon: Alta Corning, MD;  Location: [place];  Service: Orthopedics;  Laterality: Left;   TOTAL KNEE ARTHROPLASTY Right 06/04/2018   Procedure: RIGHT TOTAL KNEE ARTHROPLASTY;  Surgeon: Dorna Leitz, MD;  Location: [facility];  Service: Orthopedics;  Laterality: Right;  Adductor Block   TUBAL LIGATION      Family History  Problem Relation Age of Onset   Cancer Father    Prostate cancer Father    Diabetes Sister    Cancer Sister    Stroke Maternal Grandfather    Colitis Maternal Aunt    Breast cancer Maternal Aunt    Colon cancer  Neg Hx    Colon polyps Neg Hx    Rectal cancer Neg Hx    Stomach cancer Neg Hx    Esophageal cancer Neg Hx     Social History   Socioeconomic History   Marital status: Married    Spouse name: Not on file   Number of children: 2   Years of education: Not on file   Highest education level: Not on file  Occupational History   Not on file  Tobacco Use   Smoking status: Former    Packs/day: 0.25    Years: 40.00    Pack years: 10.00    Types: Cigarettes    Quit date: 07/09/2020    Years since quitting: 0.6   Smokeless tobacco: Never   Tobacco comments:    PACK WILL LAST 3 days  Vaping Use   Vaping Use: Never used  Substance and Sexual Activity   Alcohol use: No   Drug use: No   Sexual activity: Not Currently  Other Topics Concern   Not on file   Social History Narrative   Not on file   Social Determinants of Health   Financial Resource Strain: Low Risk    Difficulty of Paying Living Expenses: Not hard at all  Food Insecurity: No Food Insecurity   Worried About Charity fundraiser in the Last Year: Never true   [place] in the Last Year: Never true  Transportation Needs: No Transportation Needs   Lack of Transportation (Medical): No   Lack of Transportation (Non-Medical): No  Physical Activity: Not on file  Stress: Not on file  Social Connections: Not on file  Intimate Partner Violence: Not on file     Physical Exam   Vitals:   02/13/21 0335 02/13/21 0355  BP: 112/66 112/66  Pulse: 89 88  Resp: 15 16  Temp:  99.5 F (37.5 C)  SpO2: 99% 99%    CONSTITUTIONAL: Well-appearing, NAD NEURO:  Alert and oriented x 3, no focal deficits, no meningismus EYES:  eyes equal and reactive ENT/NECK:  no LAD, no JVD CARDIO: Regular rate, well-perfused, normal S1 and S2 PULM:  CTAB no wheezing or rhonchi GI/GU:  normal bowel sounds, non-distended, non-tender MSK/SPINE:  No gross deformities, no edema SKIN:  no rash, atraumatic PSYCH:  Appropriate speech and behavior  *Additional and/or pertinent findings included in MDM below  Diagnostic and Interventional Summary    EKG Interpretation  Date/Time:    Ventricular Rate:    PR Interval:    QRS Duration:   QT Interval:    QTC Calculation:   R Axis:     Text Interpretation:         Labs Reviewed  COMPREHENSIVE METABOLIC PANEL - Abnormal; Notable for the following components:      Result Value   Sodium 134 (*)    Glucose, Bld 114 (*)    All other components within normal limits  CBC WITH DIFFERENTIAL/PLATELET - Abnormal; Notable for the following components:   WBC 3.8 (*)    Hemoglobin 10.9 (*)    HCT 35.0 (*)    All other components within normal limits  URINALYSIS, ROUTINE W REFLEX MICROSCOPIC - Abnormal; Notable for the following components:    APPearance HAZY (*)    Ketones, ur 5 (*)    Leukocytes,Ua SMALL (*)    All other components within normal limits  APTT - Abnormal; Notable for the following components:   aPTT 38 (*)    All  other components within normal limits  RESP PANEL BY RT-PCR (FLU A&B, COVID) ARPGX2  CULTURE, BLOOD (ROUTINE X 2)  CULTURE, BLOOD (ROUTINE X 2)  PROTIME-INR    CT HEAD WO CONTRAST (5MM)  Final Result    DG Chest 2 View  Final Result      Medications - No data to display   Procedures  /  Critical Care Procedures  ED Course and Medical Decision Making  I have reviewed the triage vital signs, the nursing notes, and pertinent available records from the EMR.  Listed above are laboratory and imaging tests that I personally ordered, reviewed, and interpreted and then considered in my medical decision making (see below for details).  Favoring viral illness, possibly COVID-19.  Also considering bacteremia given patient's immunocompromise state on chemotherapy however patient is very well-appearing, vital signs normal, nontoxic.  Abdomen soft and nontender, normal neurological exam, no meningismus.  Awaiting COVID test.  No signs of pneumonia or UTI on testing here in the emergency department.  Will obtain CT head given the headache and history of brain mets.  Patient is not severely neutropenic, not sure if she needs for this fever at home, may consult oncology for their recommendations.     Work-up is reassuring.  After multiple hours patient continues to be well-appearing, no fever here, normal vital signs.  Does not have neutropenia with regard to the absolute neutrophil count.  Appropriate for discharge with close oncology follow-up.  Barth Kirks. [place] Small, [place] [email or web address]  Final Clinical Impressions(s) / ED Diagnoses     ICD-10-CM   1. Acute nonintractable headache, unspecified headache type  R51.9     2. Nasal congestion  R09.81      3. Fever, unspecified fever cause  R50.9       ED Discharge Orders     None        Discharge Instructions Discussed with and Provided to Patient:     Discharge Instructions      You were evaluated in the Emergency Department and after careful evaluation, we did not find any emergent condition requiring admission or further testing in the hospital.  Your exam/testing today was overall reassuring.  Recommend close follow-up with your oncology team to discuss your symptoms.  Please return to the Emergency Department if you experience any worsening of your condition.  Thank you for allowing [place] to be a part of your care.         Maudie Flakes, MD 02/13/21 [phone]

## 2021-02-14 ENCOUNTER — Telehealth: Payer: Self-pay

## 2021-02-14 ENCOUNTER — Telehealth: Payer: Medicare HMO | Admitting: Family Medicine

## 2021-02-14 NOTE — Telephone Encounter (Signed)
Pt daughter LM requesting a return call.  I have returned the call and she advised pt was seen in the ER because of a headache and fever at home. She states the pt is experiencing a cough with mucus, HA and nasal congestion. She also states pt had one episode of about a quarter size of blood in some mucus she coughed up but this has not occurred again.  I reviewed the ER notes and during their workup they completed a CXR and head CT with no concerning findings. Also pt did not have a fever during her time in the ER.  Per pts daughter, she wanted to know if the pt should see her PCP for possible allergy issues and I have advised her this would be the recommendation for management of her allergies if there nothing she takes normally. She states the pt sometimes takes Zyrtec which has worked for in the past and will give her this and r/s her appt with the PCP. I have advised to call us back if she has any concerning sx. Ms. Hannah [name] expressed understanding of this information.

## 2021-02-17 LAB — CULTURE, BLOOD (ROUTINE X 2): Culture: NO GROWTH

## 2021-02-18 ENCOUNTER — Telehealth: Payer: Self-pay | Admitting: Internal Medicine

## 2021-02-18 ENCOUNTER — Telehealth (INDEPENDENT_AMBULATORY_CARE_PROVIDER_SITE_OTHER): Payer: Medicare HMO | Admitting: Internal Medicine

## 2021-02-18 ENCOUNTER — Ambulatory Visit
Admission: RE | Admit: 2021-02-18 | Discharge: 2021-02-18 | Disposition: A | Discharge status: Home / Self Care | Payer: Medicare HMO | Source: Ambulatory Visit | Attending: Internal Medicine | Admitting: Internal Medicine

## 2021-02-18 ENCOUNTER — Other Ambulatory Visit: Payer: Self-pay

## 2021-02-18 DIAGNOSIS — J329 Chronic sinusitis, unspecified: Secondary | ICD-10-CM

## 2021-02-18 DIAGNOSIS — J309 Allergic rhinitis, unspecified: Secondary | ICD-10-CM | POA: Diagnosis not present

## 2021-02-18 DIAGNOSIS — J3489 Other specified disorders of nose and nasal sinuses: Secondary | ICD-10-CM | POA: Diagnosis not present

## 2021-02-18 DIAGNOSIS — E119 Type 2 diabetes mellitus without complications: Secondary | ICD-10-CM | POA: Diagnosis not present

## 2021-02-18 DIAGNOSIS — C7931 Secondary malignant neoplasm of brain: Secondary | ICD-10-CM

## 2021-02-18 DIAGNOSIS — C3490 Malignant neoplasm of unspecified part of unspecified bronchus or lung: Secondary | ICD-10-CM | POA: Diagnosis not present

## 2021-02-18 MED ORDER — AMOXICILLIN-POT CLAVULANATE 875-125 MG PO TABS: 1.0000 | ORAL_TABLET | Freq: Two times a day (BID) | ORAL | 0 refills | Status: DC

## 2021-02-18 MED ORDER — GADOBENATE DIMEGLUMINE 529 MG/ML IV SOLN
10.0000 mL | Freq: Once | INTRAVENOUS | Status: AC | PRN
  Administered 2021-02-18: 10 mL via INTRAVENOUS

## 2021-02-18 NOTE — Telephone Encounter (Signed)
Pt's daughter called to request an order for the liquid version of Amoxicillin-Pot Clavulanate 875-125 MG . Pt is unable to swallow the pill.   Please advise.

## 2021-02-18 NOTE — Progress Notes (Signed)
Patient ID: Hannah [name], female   DOB: 1950/11/20, 70 y.o.   MRN: [id number]  Virtual Visit via Video Note  I connected with Hannah [name] on Feb 18, 2021  at 11:00 AM EDT by a video enabled telemedicine application and verified that I am speaking with the correct person using two identifiers.  Location of all participants today Patient: at home Provider: at office   I discussed the limitations of evaluation and management by telemedicine and the availability of in person appointments. The patient expressed understanding and agreed to proceed.  History of Present Illness:  Here after recent shingles episode, starting to feel poorly aug 11, with acute onset fever, facial pain, pressure, headache, general weakness and malaise, and greenish d/c, with mild ST and cough, but pt denies chest pain, wheezing, increased sob or doe, orthopnea, PND, increased LE swelling, palpitations, dizziness or syncope.  Also, Does have several wks ongoing nasal allergy symptoms with clearish congestion, itch and sneezing, without fever, pain, ST, cough, swelling or wheezing.   Pt denies polydipsia, polyuria, or new focal neuro s/s.     Past Medical History:  Diagnosis Date   Allergy    Anemia    Anxiety    Arthritis    Carpal tunnel syndrome    Constipation    senna C stool softeners help    Depression    Diverticulosis    Dyslipidemia    External hemorrhoids    GERD (gastroesophageal reflux disease)    Heart murmur    mild-moderate AR   Hiatal hernia    Hyperlipidemia    on meds    Hypertension    Internal hemorrhoids    lung ca with brain mets 07/2020   Osteoarthritis    Pre-diabetes    PVD (peripheral vascular disease) (HCC)    moderate carotid disease   RBBB    Smoker    Vocal cord polyps    Past Surgical History:  Procedure Laterality Date   ABDOMINAL HYSTERECTOMY  7829   APPLICATION OF CRANIAL NAVIGATION N/A 08/24/2020   Procedure: APPLICATION OF CRANIAL NAVIGATION;  Surgeon:  Judith Part, MD;  Location: [place];  Service: Neurosurgery;  Laterality: N/A;   COLONOSCOPY     COLONOSCOPY W/ POLYPECTOMY  2009   CRANIOTOMY Left 08/24/2020   Procedure: Left Craniotomy for Tumor Resection with Brainlab;  Surgeon: Judith Part, MD;  Location: [place];  Service: Neurosurgery;  Laterality: Left;   HEMORRHOID SURGERY     INTERCOSTAL NERVE BLOCK Right 09/24/2020   Procedure: INTERCOSTAL NERVE BLOCK;  Surgeon: Melrose Nakayama, MD;  Location: [place];  Service: Thoracic;  Laterality: Right;   JOINT REPLACEMENT     LYMPH NODE DISSECTION Right 09/24/2020   Procedure: LYMPH NODE DISSECTION;  Surgeon: Melrose Nakayama, MD;  Location: [place];  Service: Thoracic;  Laterality: Right;   POLYPECTOMY     right total knee arthroplasty     Dr. Emeterio Reeve 06-04-18   [place]  08/16/2009   TOTAL KNEE ARTHROPLASTY Left 02/13/2014   Procedure: TOTAL KNEE ARTHROPLASTY;  Surgeon: Alta Corning, MD;  Location: [place];  Service: Orthopedics;  Laterality: Left;   TOTAL KNEE ARTHROPLASTY Right 06/04/2018   Procedure: RIGHT TOTAL KNEE ARTHROPLASTY;  Surgeon: Dorna Leitz, MD;  Location: [facility];  Service: Orthopedics;  Laterality: Right;  Adductor Block   TUBAL LIGATION      reports that she quit smoking about 7 months ago. Her smoking use included cigarettes. She has  a 10.00 pack-year smoking history. She has never used smokeless tobacco. She reports that she does not drink alcohol and does not use drugs. family history includes Breast cancer in her maternal aunt; Cancer in her father and sister; Colitis in her maternal aunt; Diabetes in her sister; Prostate cancer in her father; Stroke in her maternal grandfather. Allergies  Allergen Reactions   Amlodipine Swelling and Rash    Rash, swelling   Chantix [Varenicline Tartrate] Shortness Of Breath, Swelling and Other (See Comments)    Tongue swell,sob   Clarithromycin Rash   Lisinopril Hives   Simvastatin Hives   Wellbutrin [Bupropion  Hcl] Hives   Lipitor [Atorvastatin]     Dizziness per patient   Sertraline     Makes her feel like she is going to kill someone   Current Outpatient Medications on File Prior to Visit  Medication Sig Dispense Refill   acetaminophen (TYLENOL) 500 MG tablet Take 2 tablets (1,000 mg total) by mouth every 6 (six) hours. 30 tablet 0   ALPRAZolam (XANAX XR) 1 MG 24 hr tablet 1  qhs 30 tablet 4   ALPRAZolam (XANAX) 0.5 MG tablet 2  qam   2  @2 ;00 120 tablet 5   Ascorbic Acid (VITAMIN C) 1000 MG tablet Take 1,000 mg by mouth daily.     aspirin 81 MG tablet Take 1 tablet (81 mg total) by mouth daily. Restart on 08/31/20 30 tablet    carboxymethylcellulose (REFRESH PLUS) 0.5 % SOLN Place 1-2 drops into both eyes daily.     citalopram (CELEXA) 10 MG tablet Take 1 tablet (10 mg total) by mouth daily. 30 tablet 1   folic acid (FOLVITE) 1 MG tablet Take 1 tablet (1 mg total) by mouth daily. 30 tablet 4   hydrochlorothiazide (HYDRODIURIL) 12.5 MG tablet Take 1 tablet (12.5 mg total) by mouth daily. (Patient not taking: No sig reported) 90 tablet 1   ketoconazole (NIZORAL) 2 % cream Apply 1 application topically 2 (two) times daily. 15 g 2   loratadine (CLARITIN) 10 MG tablet Take 10 mg by mouth daily as needed for allergies.     mirtazapine (REMERON) 45 MG tablet Take 1 tablet (45 mg total) by mouth at bedtime. 30 tablet 3   neomycin-bacitracin-polymyxin (NEOSPORIN) ointment Apply 1 application topically as needed for wound care. (Patient not taking: Reported on 02/21/2021)     omeprazole (PRILOSEC) 40 MG capsule TAKE 1 CAPSULE (40 MG TOTAL) BY MOUTH 2 (TWO) TIMES DAILY BEFORE A MEAL. 180 capsule 0   polyethylene glycol (MIRALAX / GLYCOLAX) 17 g packet Take 17 g by mouth 2 (two) times daily. 72 each 0   pravastatin (PRAVACHOL) 40 MG tablet TAKE 1 TABLET (40 MG TOTAL) BY MOUTH EVERY EVENING. 90 tablet 0   Probiotic Product (PROBIOTIC DAILY PO) Take 1 capsule by mouth daily.     prochlorperazine (COMPAZINE) 10  MG tablet Take 1 tablet (10 mg total) by mouth every 6 (six) hours as needed for nausea or vomiting. (Patient not taking: No sig reported) 30 tablet 0   senna-docusate (SENOKOT-S) 8.6-50 MG tablet Take 1 tablet by mouth 2 (two) times daily. (Patient taking differently: Take 1 tablet by mouth daily as needed for mild constipation.)     simethicone (MYLICON) 80 MG chewable tablet Chew 1 tablet (80 mg total) by mouth 4 (four) times daily as needed for flatulence (Bloating). 30 tablet 0   Wheat Dextrin (BENEFIBER) POWD Take 1 Scoop by mouth 2 (two) times daily as  needed (constipation).     No current facility-administered medications on file prior to visit.    Observations/Objective: Alert, NAD, appropriate mood and affect, resps normal, cn 2-12 intact, moves all 4s, no visible rash or swelling Lab Results  Component Value Date   WBC 3.8 (L) 02/12/2021   HGB 10.9 (L) 02/12/2021   HCT 35.0 (L) 02/12/2021   PLT 163 02/12/2021   GLUCOSE 114 (H) 02/12/2021   CHOL 132 03/14/2020   TRIG 104 03/14/2020   HDL 51 03/14/2020   LDLDIRECT 87.0 11/09/2014   LDLCALC 62 03/14/2020   ALT 27 02/12/2021   AST 32 02/12/2021   NA 134 (L) 02/12/2021   K 3.8 02/12/2021   CL 98 02/12/2021   CREATININE 0.79 02/12/2021   BUN 15 02/12/2021   CO2 25 02/12/2021   TSH 0.474 01/31/2021   INR 1.0 02/12/2021   HGBA1C 6.6 (H) 09/25/2020   MICROALBUR 0.6 03/14/2020   Assessment and Plan: See notes  Follow Up Instructions: See notes   I discussed the assessment and treatment plan with the patient. The patient was provided an opportunity to ask questions and all were answered. The patient agreed with the plan and demonstrated an understanding of the instructions.   The patient was advised to call back or seek an in-person evaluation if the symptoms worsen or if the condition fails to improve as anticipated.   Cathlean Cower, MD

## 2021-02-19 MED ORDER — AMOXICILLIN-POT CLAVULANATE 250-62.5 MG/5ML PO SUSR: 250.0000 mg | Freq: Three times a day (TID) | ORAL | 0 refills | Status: DC

## 2021-02-19 NOTE — Addendum Note (Signed)
Addended by: Biagio Borg on: 02/19/2021 01:12 PM   Modules accepted: Orders

## 2021-02-19 NOTE — Telephone Encounter (Signed)
Ok this is done 

## 2021-02-20 NOTE — Telephone Encounter (Signed)
Hannah [name] is aware that medication has been changed to liquid & will pick it up when the pharmacy has it in stock.

## 2021-02-21 ENCOUNTER — Other Ambulatory Visit: Payer: Self-pay

## 2021-02-21 ENCOUNTER — Inpatient Hospital Stay (HOSPITAL_BASED_OUTPATIENT_CLINIC_OR_DEPARTMENT_OTHER): Payer: Medicare HMO | Admitting: Internal Medicine

## 2021-02-21 ENCOUNTER — Encounter: Payer: Self-pay | Admitting: Internal Medicine

## 2021-02-21 VITALS — BP 106/67 | HR 80 | Temp 98.2°F | Resp 18 | Wt 114.8 lb

## 2021-02-21 DIAGNOSIS — C3411 Malignant neoplasm of upper lobe, right bronchus or lung: Secondary | ICD-10-CM | POA: Diagnosis not present

## 2021-02-21 DIAGNOSIS — J90 Pleural effusion, not elsewhere classified: Secondary | ICD-10-CM | POA: Diagnosis not present

## 2021-02-21 DIAGNOSIS — I10 Essential (primary) hypertension: Secondary | ICD-10-CM | POA: Diagnosis not present

## 2021-02-21 DIAGNOSIS — C7931 Secondary malignant neoplasm of brain: Secondary | ICD-10-CM

## 2021-02-21 DIAGNOSIS — Z5112 Encounter for antineoplastic immunotherapy: Secondary | ICD-10-CM | POA: Diagnosis not present

## 2021-02-21 DIAGNOSIS — J309 Allergic rhinitis, unspecified: Secondary | ICD-10-CM | POA: Insufficient documentation

## 2021-02-21 DIAGNOSIS — I700 Atherosclerosis of aorta: Secondary | ICD-10-CM | POA: Diagnosis not present

## 2021-02-21 DIAGNOSIS — Z5111 Encounter for antineoplastic chemotherapy: Secondary | ICD-10-CM | POA: Diagnosis not present

## 2021-02-21 DIAGNOSIS — E785 Hyperlipidemia, unspecified: Secondary | ICD-10-CM | POA: Diagnosis not present

## 2021-02-21 DIAGNOSIS — J329 Chronic sinusitis, unspecified: Secondary | ICD-10-CM | POA: Insufficient documentation

## 2021-02-21 DIAGNOSIS — J439 Emphysema, unspecified: Secondary | ICD-10-CM | POA: Diagnosis not present

## 2021-02-21 NOTE — Progress Notes (Signed)
[facility] [place], [place] [address] [phone]   Interval Evaluation  Date of Service: 02/21/21 Patient Name: Hannah [name] Patient MRN: [id number] Patient DOB: [birth year] Provider: Ventura Sellers, MD  Identifying Statement:  VINETTE [name] is a 69 y.o. female with brain metastasis   Primary Cancer:  Oncology History  Primary cancer of right upper lobe of lung ([place])  08/07/2020 Initial Diagnosis   Primary cancer of right upper lobe of lung ([place])   09/20/2020 Cancer Staging   Staging form: Lung, AJCC 8th Edition - Clinical: Stage IVB (cT3, cN0, cM1c) - Signed by Curt Bears, MD on 09/20/2020   11/08/2020 -  Chemotherapy    Patient is on Treatment Plan: LUNG CARBOPLATIN / PEMETREXED / PEMBROLIZUMAB Q21D INDUCTION X 4 CYCLES / MAINTENANCE PEMETREXED + PEMBROLIZUMAB        Oncologic History 08/23/20: Pre-op SRS to L frontal mass Tammi Klippel) 08/24/20: Craniotomy, resection ([place]) 11/29/20: Salvage SRS x2 Tammi Klippel)  Interval History:  Hannah [name] presents today for follow up after recent MRI brain.  She is now 3 months removed from salvage radiosurgery for 2 new mets distal to initial disease site and resection cavity.  She did experience fever, some systemic symptoms last week but is improving the past few days with antiobiotic. She is walking well, doesn't have new issues with memory or cognition.  She is tolerating chemotherapy well overall, Norma Fredrickson was dropped recently.  Denies new or progressive neurologic deficits, seizures, headaches.  H+P (08/02/20) Patient presented to medical attention this past week with several days rapidly progressive speech impairment and right sided weakness.  She and her family describe difficulty putting complete sentences together, impaired use of right arm (for using fork, zipper, brushing teeth), and dragging of the right leg while walking.  She never needed assistance to walk.   CNS imaging demonstrated left frontal mass, and decadron was started over the weekend 4mg  twice per day.  She feels improved with regards to the right sided weakness with the steroids.  Otherwise no seizures, headaches.  Medications: Current Outpatient Medications on File Prior to Visit  Medication Sig Dispense Refill   acetaminophen (TYLENOL) 500 MG tablet Take 2 tablets (1,000 mg total) by mouth every 6 (six) hours. 30 tablet 0   ALPRAZolam (XANAX XR) 1 MG 24 hr tablet 1  qhs 30 tablet 4   ALPRAZolam (XANAX) 0.5 MG tablet 2  qam   2  @2 ;00 120 tablet 5   amoxicillin-clavulanate (AUGMENTIN) 250-62.5 MG/5ML suspension Take 5 mLs (250 mg total) by mouth 3 (three) times daily. 150 mL 0   Ascorbic Acid (VITAMIN C) 1000 MG tablet Take 1,000 mg by mouth daily.     aspirin 81 MG tablet Take 1 tablet (81 mg total) by mouth daily. Restart on 08/31/20 30 tablet    carboxymethylcellulose (REFRESH PLUS) 0.5 % SOLN Place 1-2 drops into both eyes daily.     citalopram (CELEXA) 10 MG tablet Take 1 tablet (10 mg total) by mouth daily. 30 tablet 1   folic acid (FOLVITE) 1 MG tablet Take 1 tablet (1 mg total) by mouth daily. 30 tablet 4   hydrochlorothiazide (HYDRODIURIL) 12.5 MG tablet Take 1 tablet (12.5 mg total) by mouth daily. (Patient not taking: Reported on 01/17/2021) 90 tablet 1   ketoconazole (NIZORAL) 2 % cream Apply 1 application topically 2 (two) times daily. 15 g 2   loratadine (CLARITIN) 10 MG tablet Take 10 mg  by mouth daily as needed for allergies.     mirtazapine (REMERON) 45 MG tablet Take 1 tablet (45 mg total) by mouth at bedtime. 30 tablet 3   neomycin-bacitracin-polymyxin (NEOSPORIN) ointment Apply 1 application topically as needed for wound care.     omeprazole (PRILOSEC) 40 MG capsule TAKE 1 CAPSULE (40 MG TOTAL) BY MOUTH 2 (TWO) TIMES DAILY BEFORE A MEAL. 180 capsule 0   polyethylene glycol (MIRALAX / GLYCOLAX) 17 g packet Take 17 g by mouth 2 (two) times daily. 72 each 0   pravastatin  (PRAVACHOL) 40 MG tablet TAKE 1 TABLET (40 MG TOTAL) BY MOUTH EVERY EVENING. 90 tablet 0   Probiotic Product (PROBIOTIC DAILY PO) Take 1 capsule by mouth daily.     prochlorperazine (COMPAZINE) 10 MG tablet Take 1 tablet (10 mg total) by mouth every 6 (six) hours as needed for nausea or vomiting. (Patient not taking: No sig reported) 30 tablet 0   senna-docusate (SENOKOT-S) 8.6-50 MG tablet Take 1 tablet by mouth 2 (two) times daily. (Patient taking differently: Take 1 tablet by mouth daily as needed for mild constipation.)     simethicone (MYLICON) 80 MG chewable tablet Chew 1 tablet (80 mg total) by mouth 4 (four) times daily as needed for flatulence (Bloating). 30 tablet 0   Wheat Dextrin (BENEFIBER) POWD Take 1 Scoop by mouth 2 (two) times daily as needed (constipation). (Patient not taking: Reported on 01/17/2021)     No current facility-administered medications on file prior to visit.    Allergies:  Allergies  Allergen Reactions   Amlodipine Swelling and Rash    Rash, swelling   Chantix [Varenicline Tartrate] Shortness Of Breath, Swelling and Other (See Comments)    Tongue swell,sob   Clarithromycin Rash   Lisinopril Hives   Simvastatin Hives   Wellbutrin [Bupropion Hcl] Hives   Lipitor [Atorvastatin]     Dizziness per patient   Sertraline     Makes her feel like she is going to kill someone   Past Medical History:  Past Medical History:  Diagnosis Date   Allergy    Anemia    Anxiety    Arthritis    Carpal tunnel syndrome    Constipation    senna C stool softeners help    Depression    Diverticulosis    Dyslipidemia    External hemorrhoids    GERD (gastroesophageal reflux disease)    Heart murmur    mild-moderate AR   Hiatal hernia    Hyperlipidemia    on meds    Hypertension    Internal hemorrhoids    lung ca with brain mets 07/2020   Osteoarthritis    Pre-diabetes    PVD (peripheral vascular disease) (HCC)    moderate carotid disease   RBBB    Smoker     Vocal cord polyps    Past Surgical History:  Past Surgical History:  Procedure Laterality Date   ABDOMINAL HYSTERECTOMY  9242   APPLICATION OF CRANIAL NAVIGATION N/A 08/24/2020   Procedure: APPLICATION OF CRANIAL NAVIGATION;  Surgeon: Judith Part, MD;  Location: [facility];  Service: Neurosurgery;  Laterality: N/A;   COLONOSCOPY     COLONOSCOPY W/ POLYPECTOMY  2009   CRANIOTOMY Left 08/24/2020   Procedure: Left Craniotomy for Tumor Resection with Brainlab;  Surgeon: Judith Part, MD;  Location: [facility];  Service: Neurosurgery;  Laterality: Left;   HEMORRHOID SURGERY     INTERCOSTAL NERVE BLOCK Right 09/24/2020   Procedure: INTERCOSTAL NERVE  BLOCK;  Surgeon: Melrose Nakayama, MD;  Location[facility];  Service: Thoracic;  Laterality: Right;   JOINT REPLACEMENT     LYMPH NODE DISSECTION Right 09/24/2020   Procedure: LYMPH NODE DISSECTION;  Surgeon: Melrose Nakayama, MD;  Location: [facility];  Service: Thoracic;  Laterality: Right;   POLYPECTOMY     right total knee arthroplasty     Dr. Emeterio Reeve 06-04-18   [facility] [place]  08/16/2009   TOTAL KNEE ARTHROPLASTY Left 02/13/2014   Procedure: TOTAL KNEE ARTHROPLASTY;  Surgeon: Alta Corning, MD;  Location: [facility];  Service: Orthopedics;  Laterality: Left;   TOTAL KNEE ARTHROPLASTY Right 06/04/2018   Procedure: RIGHT TOTAL KNEE ARTHROPLASTY;  Surgeon: Dorna Leitz, MD;  Location: [facility];  Service: Orthopedics;  Laterality: Right;  Adductor Block   TUBAL LIGATION     Social History:  Social History   Socioeconomic History   Marital status: Married    Spouse name: Not on file   Number of children: 2   Years of education: Not on file   Highest education level: Not on file  Occupational History   Not on file  Tobacco Use   Smoking status: Former    Packs/day: 0.25    Years: 40.00    Pack years: 10.00    Types: Cigarettes    Quit date: 07/09/2020    Years since quitting: 0.6   Smokeless tobacco: Never   Tobacco comments:    PACK WILL  LAST 3 days  Vaping Use   Vaping Use: Never used  Substance and Sexual Activity   Alcohol use: No   Drug use: No   Sexual activity: Not Currently  Other Topics Concern   Not on file  Social History Narrative   Not on file   Social Determinants of Health   Financial Resource Strain: Low Risk    Difficulty of Paying Living Expenses: Not hard at all  Food Insecurity: No Food Insecurity   Worried About Charity fundraiser in the Last Year: Never true   [place] in the Last Year: Never true  Transportation Needs: No Transportation Needs   Lack of Transportation (Medical): No   Lack of Transportation (Non-Medical): No  Physical Activity: Not on file  Stress: Not on file  Social Connections: Not on file  Intimate Partner Violence: Not on file   Family History:  Family History  Problem Relation Age of Onset   Cancer Father    Prostate cancer Father    Diabetes Sister    Cancer Sister    Stroke Maternal Grandfather    Colitis Maternal Aunt    Breast cancer Maternal Aunt    Colon cancer Neg Hx    Colon polyps Neg Hx    Rectal cancer Neg Hx    Stomach cancer Neg Hx    Esophageal cancer Neg Hx     Review of Systems: Constitutional: Doesn't report fevers, chills or abnormal weight loss Eyes: Doesn't report blurriness of vision Ears, nose, mouth, throat, and face: Doesn't report sore throat Respiratory: Doesn't report cough, dyspnea or wheezes Cardiovascular: Doesn't report palpitation, chest discomfort  Gastrointestinal:  Doesn't report nausea, constipation, diarrhea GU: Doesn't report incontinence Skin: Doesn't report skin rashes Neurological: Per HPI Musculoskeletal: Doesn't report joint pain Behavioral/Psych: ++anxiety  Physical Exam: Vitals:   02/21/21 1238  BP: 106/67  Pulse: 80  Resp: 18  Temp: 98.2 F (36.8 C)  SpO2: 100%   KPS: 80. General: Alert, cooperative, pleasant, in  no acute distress Head: Normal EENT: No conjunctival injection or scleral  icterus.  Lungs: Resp effort normal Cardiac: Regular rate Abdomen: Non-distended abdomen Skin: No rashes cyanosis or petechiae. Extremities: No clubbing or edema  Neurologic Exam: Mental Status: Awake, alert, attentive to examiner. Oriented to self and environment. Language is intact with regards to fluency, comprehension.  Cranial Nerves: Visual acuity is grossly normal. Visual fields are full. Extra-ocular movements intact. No ptosis. Face is symmetric Motor: Tone and bulk are normal. Power is 5/5 throughout. Reflexes are symmetric, no pathologic reflexes present.  Sensory: Intact to light touch Gait: Independent  Labs: I have reviewed the data as listed    Component Value Date/Time   NA 134 (L) 02/12/2021 2048   K 3.8 02/12/2021 2048   CL 98 02/12/2021 2048   CO2 25 02/12/2021 2048   GLUCOSE 114 (H) 02/12/2021 2048   BUN 15 02/12/2021 2048   CREATININE 0.79 02/12/2021 2048   CREATININE 0.71 02/07/2021 0846   CREATININE 0.79 03/14/2020 1016   CALCIUM 9.8 02/12/2021 2048   PROT 7.9 02/12/2021 2048   ALBUMIN 4.1 02/12/2021 2048   AST 32 02/12/2021 2048   AST 27 02/07/2021 0846   ALT 27 02/12/2021 2048   ALT 25 02/07/2021 0846   ALKPHOS 68 02/12/2021 2048   BILITOT 0.6 02/12/2021 2048   BILITOT 0.2 (L) 02/07/2021 0846   GFRNONAA >60 02/12/2021 2048   GFRNONAA >60 02/07/2021 0846   GFRNONAA 75 03/14/2020 1016   GFRAA 87 03/14/2020 1016   Lab Results  Component Value Date   WBC 3.8 (L) 02/12/2021   NEUTROABS 2.5 02/12/2021   HGB 10.9 (L) 02/12/2021   HCT 35.0 (L) 02/12/2021   MCV 86.6 02/12/2021   PLT 163 02/12/2021    Imaging:  [facility] Clinician Interpretation: I have personally reviewed the CNS images as listed.  My interpretation, in the context of the patient's clinical presentation, is stable disease  DG Chest 2 View  Result Date: 02/12/2021 CLINICAL DATA:  Fever and headaches for 24 hours, initial encounter EXAM: CHEST - 2 VIEW COMPARISON:  11/20/2020, CT  from 01/16/2021 FINDINGS: Cardiac shadow is within normal limits. Left lung is clear. Mild volume loss with tenting of the diaphragm on the right is noted stable in appearance from the prior exam. Postsurgical changes in the right upper lobe are noted and stable. No acute bony abnormality is seen. Postsurgical changes in the lumbar spine are noted. IMPRESSION: Postsurgical changes on the right without acute intrathoracic abnormality. Electronically Signed   By: Inez Catalina M.D.   On: 02/12/2021 21:05   CT HEAD WO CONTRAST (5MM)  Result Date: 02/13/2021 CLINICAL DATA:  MRI 11/16/2020 EXAM: CT HEAD WITHOUT CONTRAST TECHNIQUE: Contiguous axial images were obtained from the base of the skull through the vertex without intravenous contrast. COMPARISON:  MRI 11/16/2020 FINDINGS: Brain: Previously seen bilateral parietal metastases not visible on this noncontrast CT. Postoperative changes in the left frontal lobe. No hemorrhage, hydrocephalus or acute infarction. Vascular: No hyperdense vessel or unexpected calcification. Skull: Prior left frontal craniotomy. No acute calvarial abnormality. Sinuses/Orbits: Visualized paranasal sinuses and mastoids clear. Orbital soft tissues unremarkable. Other: None IMPRESSION: Previously seen metastases on MRI not visible on this noncontrast head CT. No acute intracranial abnormality. Electronically Signed   By: Rolm Baptise M.D.   On: 02/13/2021 03:10   MR BRAIN W WO CONTRAST  Result Date: 02/18/2021 CLINICAL DATA:  Metastatic lung cancer. Restaging. Previous craniotomy and radiation. EXAM: MRI HEAD WITHOUT AND WITH  CONTRAST TECHNIQUE: Multiplanar, multiecho pulse sequences of the brain and surrounding structures were obtained without and with intravenous contrast. CONTRAST:  67mL MULTIHANCE GADOBENATE DIMEGLUMINE 529 MG/ML IV SOLN COMPARISON:  Head CT 5 days ago.  Brain MRI 11/16/2020. FINDINGS: Brain: Brainstem and cerebellum remain normal. Previously seen ring-enhancing  lesion in the peripheral right temporoparietal junction is markedly smaller, measuring 2 mm today compared with 8-9 mm on the previous study. Axial image 83. Second previously seen new metastasis in the left posteroinferior parietal lobe measures 3.4 mm compared with 3.9 mm previously. Axial image 80. Previous surgical region at the left frontal vertex continues to show contraction with expected marginal enhancement and regional T2 and FLAIR signal within the white matter. No progressive or worrisome finding. No hydrocephalus or extra-axial fluid collection. Vascular: Major vessels at the base of the brain show flow. Skull and upper cervical spine: Otherwise negative Sinuses/Orbits: Mild mucosal thickening of the paranasal sinuses. No advanced sinusitis. Orbits negative. Other: None IMPRESSION: Previously seen 9 mm metastasis at the right temporoparietal junction now measures only 2 mm. Previously seen 3.9 mm metastasis in the left posteroinferior parietal lobe now measures 3.4 mm. Progressive evolutionary changes of the surgical site at the resection in the left frontal vertex. Diminishing mass effect. No worsening or progressive findings. Electronically Signed   By: Nelson Chimes M.D.   On: 02/18/2021 13:48      Assessment/Plan Brain metastasis ([place])  Deandre N Blackshire is clinically and radiographically stable today, now s/p 3 months salvage SRS.  No new or progressive deficits.  She will con't on systemic therapy with Dr. Julien Nordmann.  We appreciate the opportunity to participate in the care of [name].    We ask that [name] return to clinic in 3 months following next brain MRI, or sooner as needed.  All questions were answered. The patient knows to call the clinic with any problems, questions or concerns. No barriers to learning were detected.  The total time spent in the encounter was 30 minutes and more than 50% was on counseling and review of test results   Ventura Sellers,  MD Medical Director of Neuro-Oncology [facility] [place] 02/21/21 12:51 PM

## 2021-02-21 NOTE — Patient Instructions (Signed)
Please take all new medication as prescribed 

## 2021-02-21 NOTE — Assessment & Plan Note (Signed)
Mild to mod, for antibx course,  to f/u any worsening symptoms or concerns 

## 2021-02-21 NOTE — Assessment & Plan Note (Signed)
Mild to mod, for allergra prn,  to f/u any worsening symptoms or concerns

## 2021-02-21 NOTE — Assessment & Plan Note (Signed)
Lab Results  Component Value Date   HGBA1C 6.6 (H) 09/25/2020   Stable, pt to continue current medical treatment  - diet

## 2021-02-25 ENCOUNTER — Inpatient Hospital Stay: Payer: Medicare HMO

## 2021-02-25 ENCOUNTER — Telehealth: Payer: Medicare HMO | Admitting: Internal Medicine

## 2021-02-25 ENCOUNTER — Other Ambulatory Visit: Payer: Self-pay

## 2021-02-25 NOTE — Progress Notes (Signed)
Hannah [name] OFFICE PROGRESS NOTE  Hannah Rail, [place] [id number]  DIAGNOSIS:  Stage IV non-small cell lung cancer (T, N0, M1C) adenocarcinoma.  The patient presented with a right upper lobe lung mass and solitary brain metastasis.  The patient was diagnosed in February 2022.   Biomarker Findings Microsatellite status - MS-Stable Tumor Mutational Burden - 8 Muts/Mb Genomic Findings For a complete list of the genes assayed, please refer to the Appendix. KRAS G12V KEAP1 S224F TP53 P151T 7 Disease relevant genes with no reportable alterations: ALK, BRAF, EGFR, ERBB2, MET, RET, ROS1   PDL1 Expression: 90%  PRIOR THERAPY: 1) SRS to the metastatic brain lesion on 08/23/2020 under the care of Dr. Tammi Klippel and craniotomy under the care of Dr. Zada Finders on 08/24/2020. 2) S/p robotic assisted right upper lobectomy with en bloc wedge resection of the right middle lobe and lymph node dissection under the care of Dr. Roxan Hockey on September 24, 2020 3) SRS to the two new subcentimeter metastases under the care of Dr. Tammi Klippel on 11/29/20.   CURRENT THERAPY:  Systemic chemotherapy with carboplatin for AUC of 5, Alimta 500 Mg/M2 and Keytruda 200 mg IV every 3 weeks.  First dose Nov 08, 2020. Status post 5 cycles.  Starting from cycle #5, the patient started maintenance Alimta and Keytruda.  INTERVAL HISTORY: Hannah [name] 70 y.o. female returns to the clinic today for a follow-up visit. The patient is feeling fairly well today without any concerning complaints.  In the interval since her last appointment, she presented to the emergency room on 02/12/2021 for the chief complaint of a fever.  The patient was treated with Augmentin.  Her symptoms have improved at this time. She denies any more fevers.   The patient also recently followed up with Dr. Mickeal Skinner from neuro oncology due to her history of metastatic disease to the brain.  Otherwise the patient tolerated her  last cycle of treatment fairly well without any concerning adverse side effects except for fatigue.  She denies any recent chills.She takes remeron for her appetite and depression. She denies any nausea, vomiting, or constipation. She is having mild diarrhea/looser stools from her antibiotic use. He denies any rashes or skin changes.  She denies any headaches. She denies any chest pain, shortness of breath, cough, or hemoptysis.  The patient is here today for evaluation before starting cycle #6.  MEDICAL HISTORY: Past Medical History:  Diagnosis Date   Allergy    Anemia    Anxiety    Arthritis    Carpal tunnel syndrome    Constipation    senna C stool softeners help    Depression    Diverticulosis    Dyslipidemia    External hemorrhoids    GERD (gastroesophageal reflux disease)    Heart murmur    mild-moderate AR   Hiatal hernia    Hyperlipidemia    on meds    Hypertension    Internal hemorrhoids    lung ca with brain mets 07/2020   Osteoarthritis    Pre-diabetes    PVD (peripheral vascular disease) (HCC)    moderate carotid disease   RBBB    Smoker    Vocal cord polyps     ALLERGIES:  is allergic to amlodipine, chantix [varenicline tartrate], clarithromycin, lisinopril, simvastatin, wellbutrin [bupropion hcl], lipitor [atorvastatin], and sertraline.  MEDICATIONS:  Current Outpatient Medications  Medication Sig Dispense Refill   acetaminophen (TYLENOL) 500 MG tablet Take 2 tablets (1,000  mg total) by mouth every 6 (six) hours. 30 tablet 0   ALPRAZolam (XANAX XR) 1 MG 24 hr tablet 1  qhs 30 tablet 4   ALPRAZolam (XANAX) 0.5 MG tablet 2  qam   2  $'@2'i$ ;00 120 tablet 5   amoxicillin-clavulanate (AUGMENTIN) 250-62.5 MG/5ML suspension Take 5 mLs (250 mg total) by mouth 3 (three) times daily. 150 mL 0   Ascorbic Acid (VITAMIN C) 1000 MG tablet Take 1,000 mg by mouth daily.     aspirin 81 MG tablet Take 1 tablet (81 mg total) by mouth daily. Restart on 08/31/20 30 tablet     carboxymethylcellulose (REFRESH PLUS) 0.5 % SOLN Place 1-2 drops into both eyes daily.     citalopram (CELEXA) 10 MG tablet Take 1 tablet (10 mg total) by mouth daily. 30 tablet 1   folic acid (FOLVITE) 1 MG tablet Take 1 tablet (1 mg total) by mouth daily. 30 tablet 4   hydrochlorothiazide (HYDRODIURIL) 12.5 MG tablet Take 1 tablet (12.5 mg total) by mouth daily. (Patient not taking: No sig reported) 90 tablet 1   ketoconazole (NIZORAL) 2 % cream Apply 1 application topically 2 (two) times daily. 15 g 2   loratadine (CLARITIN) 10 MG tablet Take 10 mg by mouth daily as needed for allergies.     mirtazapine (REMERON) 45 MG tablet Take 1 tablet (45 mg total) by mouth at bedtime. 30 tablet 3   neomycin-bacitracin-polymyxin (NEOSPORIN) ointment Apply 1 application topically as needed for wound care. (Patient not taking: Reported on 02/21/2021)     omeprazole (PRILOSEC) 40 MG capsule TAKE 1 CAPSULE (40 MG TOTAL) BY MOUTH 2 (TWO) TIMES DAILY BEFORE A MEAL. 180 capsule 0   polyethylene glycol (MIRALAX / GLYCOLAX) 17 g packet Take 17 g by mouth 2 (two) times daily. 72 each 0   pravastatin (PRAVACHOL) 40 MG tablet TAKE 1 TABLET (40 MG TOTAL) BY MOUTH EVERY EVENING. 90 tablet 0   Probiotic Product (PROBIOTIC DAILY PO) Take 1 capsule by mouth daily.     prochlorperazine (COMPAZINE) 10 MG tablet Take 1 tablet (10 mg total) by mouth every 6 (six) hours as needed for nausea or vomiting. (Patient not taking: No sig reported) 30 tablet 0   senna-docusate (SENOKOT-S) 8.6-50 MG tablet Take 1 tablet by mouth 2 (two) times daily. (Patient taking differently: Take 1 tablet by mouth daily as needed for mild constipation.)     simethicone (MYLICON) 80 MG chewable tablet Chew 1 tablet (80 mg total) by mouth 4 (four) times daily as needed for flatulence (Bloating). 30 tablet 0   Wheat Dextrin (BENEFIBER) POWD Take 1 Scoop by mouth 2 (two) times daily as needed (constipation).     No current facility-administered medications  for this visit.    SURGICAL HISTORY:  Past Surgical History:  Procedure Laterality Date   ABDOMINAL HYSTERECTOMY  5681   APPLICATION OF CRANIAL NAVIGATION N/A 08/24/2020   Procedure: APPLICATION OF CRANIAL NAVIGATION;  Surgeon: Judith Part, MD;  Location: [facility];  Service: Neurosurgery;  Laterality: N/A;   COLONOSCOPY     COLONOSCOPY W/ POLYPECTOMY  2009   CRANIOTOMY Left 08/24/2020   Procedure: Left Craniotomy for Tumor Resection with Brainlab;  Surgeon: Judith Part, MD;  Location: [facility];  Service: Neurosurgery;  Laterality: Left;   HEMORRHOID SURGERY     INTERCOSTAL NERVE BLOCK Right 09/24/2020   Procedure: INTERCOSTAL NERVE BLOCK;  Surgeon: Melrose Nakayama, MD;  Location: [facility];  Service: Thoracic;  Laterality: Right;  JOINT REPLACEMENT     LYMPH NODE DISSECTION Right 09/24/2020   Procedure: LYMPH NODE DISSECTION;  Surgeon: Loreli Slot, MD;  [facility];  Service: Thoracic;  Laterality: Right;   POLYPECTOMY     right total knee arthroplasty     Dr. Sherle Poe 06-04-18   SPINE SURGERY  08/16/2009   TOTAL KNEE ARTHROPLASTY Left 02/13/2014   Procedure: TOTAL KNEE ARTHROPLASTY;  Surgeon: Harvie Junior, MD;  Location: [facility] OR;  Service: Orthopedics;  Laterality: Left;   TOTAL KNEE ARTHROPLASTY Right 06/04/2018   Procedure: RIGHT TOTAL KNEE ARTHROPLASTY;  Surgeon: Jodi Geralds, MD;  Location: [facility];  Service: Orthopedics;  Laterality: Right;  Adductor Block   TUBAL LIGATION      REVIEW OF SYSTEMS:   Review of Systems  Constitutional: Positive for fatigue. Negative for appetite change, chills, fever and unexpected weight change. HENT: Negative for mouth sores, nosebleeds, sore throat and trouble swallowing.   Eyes: Negative for eye problems and icterus. Respiratory: Negative for cough, hemoptysis, shortness of breath and wheezing.   Cardiovascular: Negative for chest pain and leg swelling. Gastrointestinal: Negative for abdominal pain, constipation, diarrhea,  nausea and vomiting. Genitourinary: Negative for bladder incontinence, difficulty urinating, dysuria, frequency and hematuria.   Musculoskeletal: Negative for back pain, gait problem, neck pain and neck stiffness. Skin: Negative for itching and rash. Neurological: Negative for dizziness, extremity weakness, gait problem, headaches, light-headedness and seizures. Hematological: Negative for adenopathy. Does not bruise/bleed easily. Psychiatric/Behavioral: Negative for confusion, depression and sleep disturbance. The patient is not nervous/anxious.       PHYSICAL EXAMINATION:  Blood pressure 111/63, pulse 85, temperature 97.9 F (36.6 C), temperature source Oral, resp. rate 17, height 5' (1.524 m), weight 114 lb 8 oz (51.9 kg), SpO2 98 %.  ECOG PERFORMANCE STATUS: 1  Physical Exam  Constitutional: Oriented to person, place, and time and thin appearing female and in no distress. HENT: Head: Normocephalic and atraumatic. Mouth/Throat: Oropharynx is clear and moist. No oropharyngeal exudate. Eyes: Conjunctivae are normal. Right eye exhibits no discharge. Left eye exhibits no discharge. No scleral icterus. Neck: Normal range of motion. Neck supple. Cardiovascular: Normal rate, regular rhythm, mild systolic murmur noted and intact distal pulses.   Pulmonary/Chest: Effort normal. Decreased breath sounds in right lung base. No respiratory distress. No wheezes. No rales. Abdominal: Soft. Bowel sounds are normal. Exhibits no distension and no mass. There is no tenderness.  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 No weakness in hands. No decreased sensation. No swelling or erythema. Some tenderness to palpation over MCP. Normal range of motion. Exhibits no edema.  Lymphadenopathy:    No cervical adenopathy.  Neurological: Alert and oriented to person, place, and time. Exhibits normal muscle tone. Gait normal. Coordination normal. Skin: Skin is warm and dry. No rash noted. Not diaphoretic.  No erythema. No pallor.  Psychiatric: Mood, memory and judgment normal. Vitals reviewed  LABORATORY DATA: Lab Results  Component Value Date   WBC 4.1 02/27/2021   HGB 9.1 (L) 02/27/2021   HCT 28.9 (L) 02/27/2021   MCV 86.0 02/27/2021   PLT 415 (H) 02/27/2021      Chemistry      Component Value Date/Time   NA 137 02/27/2021 0935   K 4.2 02/27/2021 0935   CL 103 02/27/2021 0935   CO2 24 02/27/2021 0935   BUN 8 02/27/2021 0935   CREATININE 0.76 02/27/2021 0935   CREATININE 0.79 03/14/2020 1016      Component Value Date/Time  CALCIUM 10.0 02/27/2021 0935   ALKPHOS 69 02/27/2021 0935   AST 44 (H) 02/27/2021 0935   ALT 35 02/27/2021 0935   BILITOT 0.3 02/27/2021 0935       RADIOGRAPHIC STUDIES:  DG Chest 2 View  Result Date: 02/12/2021 CLINICAL DATA:  Fever and headaches for 24 hours, initial encounter EXAM: CHEST - 2 VIEW COMPARISON:  11/20/2020, CT from 01/16/2021 FINDINGS: Cardiac shadow is within normal limits. Left lung is clear. Mild volume loss with tenting of the diaphragm on the right is noted stable in appearance from the prior exam. Postsurgical changes in the right upper lobe are noted and stable. No acute bony abnormality is seen. Postsurgical changes in the lumbar spine are noted. IMPRESSION: Postsurgical changes on the right without acute intrathoracic abnormality. Electronically Signed   By: Inez Catalina M.D.   On: 02/12/2021 21:05   CT HEAD WO CONTRAST (5MM)  Result Date: 02/13/2021 CLINICAL DATA:  MRI 11/16/2020 EXAM: CT HEAD WITHOUT CONTRAST TECHNIQUE: Contiguous axial images were obtained from the base of the skull through the vertex without intravenous contrast. COMPARISON:  MRI 11/16/2020 FINDINGS: Brain: Previously seen bilateral parietal metastases not visible on this noncontrast CT. Postoperative changes in the left frontal lobe. No hemorrhage, hydrocephalus or acute infarction. Vascular: No hyperdense vessel or unexpected calcification. Skull: Prior  left frontal craniotomy. No acute calvarial abnormality. Sinuses/Orbits: Visualized paranasal sinuses and mastoids clear. Orbital soft tissues unremarkable. Other: None IMPRESSION: Previously seen metastases on MRI not visible on this noncontrast head CT. No acute intracranial abnormality. Electronically Signed   By: Rolm Baptise M.D.   On: 02/13/2021 03:10   MR BRAIN W WO CONTRAST  Result Date: 02/18/2021 CLINICAL DATA:  Metastatic lung cancer. Restaging. Previous craniotomy and radiation. EXAM: MRI HEAD WITHOUT AND WITH CONTRAST TECHNIQUE: Multiplanar, multiecho pulse sequences of the brain and surrounding structures were obtained without and with intravenous contrast. CONTRAST:  55mL MULTIHANCE GADOBENATE DIMEGLUMINE 529 MG/ML IV SOLN COMPARISON:  Head CT 5 days ago.  Brain MRI 11/16/2020. FINDINGS: Brain: Brainstem and cerebellum remain normal. Previously seen ring-enhancing lesion in the peripheral right temporoparietal junction is markedly smaller, measuring 2 mm today compared with 8-9 mm on the previous study. Axial image 83. Second previously seen new metastasis in the left posteroinferior parietal lobe measures 3.4 mm compared with 3.9 mm previously. Axial image 80. Previous surgical region at the left frontal vertex continues to show contraction with expected marginal enhancement and regional T2 and FLAIR signal within the white matter. No progressive or worrisome finding. No hydrocephalus or extra-axial fluid collection. Vascular: Major vessels at the base of the brain show flow. Skull and upper cervical spine: Otherwise negative Sinuses/Orbits: Mild mucosal thickening of the paranasal sinuses. No advanced sinusitis. Orbits negative. Other: None IMPRESSION: Previously seen 9 mm metastasis at the right temporoparietal junction now measures only 2 mm. Previously seen 3.9 mm metastasis in the left posteroinferior parietal lobe now measures 3.4 mm. Progressive evolutionary changes of the surgical site at  the resection in the left frontal vertex. Diminishing mass effect. No worsening or progressive findings. Electronically Signed   By: Nelson Chimes M.D.   On: 02/18/2021 13:48     ASSESSMENT/PLAN:  This is a very pleasant 70 year old African-American female diagnosed with stage IV (T3, N0, M1 B) non-small cell lung cancer, adenocarcinoma.  She presented with a right upper lobe lung mass with a solitary brain metastasis.  She was diagnosed in February 2022.  Her PD-L1 expression is 90%.  She does  not have any actionable mutations.   The patient completed SRS followed by craniotomy and resection on 08/23/2020 under the care of Dr. Tammi Klippel and craniotomy under the care of Dr. Zada Finders on 08/24/2020.   She then had a robot-assisted right upper lobectomy with en bloc wedge resection of the right middle lobe and lymph node dissection under the care of Dr. Roxan Hockey on September 24, 2020.  She underwent SRS to the two new subcentimeter brain metastases on 11/29/20.   The patient is currently undergoing systemic chemotherapy with 4-6 cycles of carboplatin for an AUC of 5, Alimta 500 mg per metered squared, Keytruda 200 mg IV every 3 weeks.  She is status post 5 cycles and tolerated it well without any adverse side effects.  Starting from cycle #5, the patient started maintenance Alimta and Keytruda.   Labs reviewed.  Recommend that she proceed with cycle #6 today scheduled.  We will see her back for follow-up visit in 3 weeks for evaluation and to review her scan results.  I will arrange for restaging CT scan the chest, abdomen, and pelvis prior to starting her next cycle of treatment.  She will continue to take remeron for her decreased appetite.   Encouraged her to increase her exercise. She has noticed some fatigue since getting her shingles vaccine. She used to go to [employer]. Encouraged her to slowly increase her activity to prevent deconditioning.   The patient was advised to call immediately if she  has any concerning symptoms in the interval. The patient voices understanding of current disease status and treatment options and is in agreement with the current care plan. All questions were answered. The patient knows to call the clinic with any problems, questions or concerns. We can certainly see the patient much sooner if necessary        Orders Placed This Encounter  Procedures   CT Chest W Contrast    Standing Status:   Future    Standing Expiration Date:   02/27/2022    Scheduling Instructions:     Call her daughter to schedule.    Order Specific Question:   If indicated for the ordered procedure, I authorize the administration of contrast media per Radiology protocol    Answer:   Yes    Order Specific Question:   Preferred imaging location?    Answer:   [facility]   CT Abdomen Pelvis W Contrast    Standing Status:   Future    Standing Expiration Date:   02/27/2022    Scheduling Instructions:     Call her daughter to schedule.    Order Specific Question:   If indicated for the ordered procedure, I authorize the administration of contrast media per Radiology protocol    Answer:   Yes    Order Specific Question:   Preferred imaging location?    Answer:   [facility]    Order Specific Question:   Is Oral Contrast requested for this exam?    Answer:   Yes, Per Radiology protocol   CBC with Differential ([place] Only)    Standing Status:   Standing    Number of Occurrences:   30    Standing Expiration Date:   02/27/2022   CMP ([place] only)    Standing Status:   Standing    Number of Occurrences:   30    Standing Expiration Date:   02/27/2022     The total time spent in the appointment  was 20-29 minutes.  Cartier Washko L Bianco Cange, PA-C 02/27/21

## 2021-02-26 ENCOUNTER — Other Ambulatory Visit: Payer: Self-pay | Admitting: *Deleted

## 2021-02-26 DIAGNOSIS — C7931 Secondary malignant neoplasm of brain: Secondary | ICD-10-CM

## 2021-02-27 ENCOUNTER — Inpatient Hospital Stay: Payer: Medicare HMO

## 2021-02-27 ENCOUNTER — Other Ambulatory Visit: Payer: Self-pay

## 2021-02-27 ENCOUNTER — Inpatient Hospital Stay (HOSPITAL_BASED_OUTPATIENT_CLINIC_OR_DEPARTMENT_OTHER): Payer: Medicare HMO | Admitting: Physician Assistant

## 2021-02-27 VITALS — BP 111/63 | HR 85 | Temp 97.9°F | Resp 17 | Ht 60.0 in | Wt 114.5 lb

## 2021-02-27 DIAGNOSIS — I10 Essential (primary) hypertension: Secondary | ICD-10-CM | POA: Diagnosis not present

## 2021-02-27 DIAGNOSIS — J439 Emphysema, unspecified: Secondary | ICD-10-CM | POA: Diagnosis not present

## 2021-02-27 DIAGNOSIS — C3411 Malignant neoplasm of upper lobe, right bronchus or lung: Secondary | ICD-10-CM

## 2021-02-27 DIAGNOSIS — E785 Hyperlipidemia, unspecified: Secondary | ICD-10-CM | POA: Diagnosis not present

## 2021-02-27 DIAGNOSIS — C7931 Secondary malignant neoplasm of brain: Secondary | ICD-10-CM

## 2021-02-27 DIAGNOSIS — Z5111 Encounter for antineoplastic chemotherapy: Secondary | ICD-10-CM

## 2021-02-27 DIAGNOSIS — Z5112 Encounter for antineoplastic immunotherapy: Secondary | ICD-10-CM | POA: Diagnosis not present

## 2021-02-27 DIAGNOSIS — I700 Atherosclerosis of aorta: Secondary | ICD-10-CM | POA: Diagnosis not present

## 2021-02-27 DIAGNOSIS — J90 Pleural effusion, not elsewhere classified: Secondary | ICD-10-CM | POA: Diagnosis not present

## 2021-02-27 LAB — CBC WITH DIFFERENTIAL (CANCER CENTER ONLY)
Abs Immature Granulocytes: 0.02 10*3/uL (ref 0.00–0.07)
Basophils Absolute: 0 10*3/uL (ref 0.0–0.1)
Basophils Relative: 1 %
Eosinophils Absolute: 0 10*3/uL (ref 0.0–0.5)
Eosinophils Relative: 0 %
HCT: 28.9 % — ABNORMAL LOW (ref 36.0–46.0)
Hemoglobin: 9.1 g/dL — ABNORMAL LOW (ref 12.0–15.0)
Immature Granulocytes: 1 %
Lymphocytes Relative: 34 %
Lymphs Abs: 1.4 10*3/uL (ref 0.7–4.0)
MCH: 27.1 pg (ref 26.0–34.0)
MCHC: 31.5 g/dL (ref 30.0–36.0)
MCV: 86 fL (ref 80.0–100.0)
Monocytes Absolute: 0.7 10*3/uL (ref 0.1–1.0)
Monocytes Relative: 17 %
Neutro Abs: 1.9 10*3/uL (ref 1.7–7.7)
Neutrophils Relative %: 47 %
Platelet Count: 415 10*3/uL — ABNORMAL HIGH (ref 150–400)
RBC: 3.36 MIL/uL — ABNORMAL LOW (ref 3.87–5.11)
RDW: 14.1 % (ref 11.5–15.5)
WBC Count: 4.1 10*3/uL (ref 4.0–10.5)
nRBC: 0 % (ref 0.0–0.2)

## 2021-02-27 LAB — CMP (CANCER CENTER ONLY)
ALT: 35 U/L (ref 0–44)
AST: 44 U/L — ABNORMAL HIGH (ref 15–41)
Albumin: 3.6 g/dL (ref 3.5–5.0)
Alkaline Phosphatase: 69 U/L (ref 38–126)
Anion gap: 10 (ref 5–15)
BUN: 8 mg/dL (ref 8–23)
CO2: 24 mmol/L (ref 22–32)
Calcium: 10 mg/dL (ref 8.9–10.3)
Chloride: 103 mmol/L (ref 98–111)
Creatinine: 0.76 mg/dL (ref 0.44–1.00)
GFR, Estimated: >60 mL/min (ref >60)
Glucose, Bld: 122 mg/dL — ABNORMAL HIGH (ref 70–99)
Potassium: 4.2 mmol/L (ref 3.5–5.1)
Sodium: 137 mmol/L (ref 135–145)
Total Bilirubin: 0.3 mg/dL (ref 0.3–1.2)
Total Protein: 7.2 g/dL (ref 6.5–8.1)

## 2021-02-27 LAB — TSH: TSH: 0.594 u[IU]/mL (ref 0.308–3.960)

## 2021-02-27 MED ORDER — PROCHLORPERAZINE MALEATE 10 MG PO TABS
10.0000 mg | ORAL_TABLET | Freq: Once | ORAL | Status: AC
  Administered 2021-02-27: 10 mg via ORAL
  Filled 2021-02-27: qty 1

## 2021-02-27 MED ORDER — SODIUM CHLORIDE 0.9 % IV SOLN
500.0000 mg/m2 | Freq: Once | INTRAVENOUS | Status: AC
  Administered 2021-02-27: 700 mg via INTRAVENOUS
  Filled 2021-02-27: qty 20

## 2021-02-27 MED ORDER — CYANOCOBALAMIN 1000 MCG/ML IJ SOLN
1000.0000 ug | Freq: Once | INTRAMUSCULAR | Status: AC
  Administered 2021-02-27: 1000 ug via INTRAMUSCULAR
  Filled 2021-02-27: qty 1

## 2021-02-27 MED ORDER — SODIUM CHLORIDE 0.9 % IV SOLN
Freq: Once | INTRAVENOUS | Status: AC
Start: 2021-02-27 — End: 2021-02-27

## 2021-02-27 MED ORDER — PEMBROLIZUMAB CHEMO INJECTION 100 MG/4ML
200.0000 mg | Freq: Once | INTRAVENOUS | Status: AC
  Administered 2021-02-27: 200 mg via INTRAVENOUS
  Filled 2021-02-27: qty 8

## 2021-02-27 NOTE — Patient Instructions (Signed)
[facility] MEDICAL ONCOLOGY  Discharge Instructions: Thank you for choosing [facility] to provide your oncology and hematology care.   If you have a lab appointment with the Cancer Center, please go directly to the Cancer Center and check in at the registration area.   Wear comfortable clothing and clothing appropriate for easy access to any Portacath or PICC line.   We strive to give you quality time with your provider. You may need to reschedule your appointment if you arrive late (15 or more minutes).  Arriving late affects you and other patients whose appointments are after yours.  Also, if you miss three or more appointments without notifying the office, you may be dismissed from the clinic at the provider's discretion.      For prescription refill requests, have your pharmacy contact our office and allow 72 hours for refills to be completed.    Today you received the following chemotherapy and/or immunotherapy agents: Keytruda/Alimta.      To help prevent nausea and vomiting after your treatment, we encourage you to take your nausea medication as directed.  BELOW ARE SYMPTOMS THAT SHOULD BE REPORTED IMMEDIATELY: *FEVER GREATER THAN 100.4 F (38 C) OR HIGHER *CHILLS OR SWEATING *NAUSEA AND VOMITING THAT IS NOT CONTROLLED WITH YOUR NAUSEA MEDICATION *UNUSUAL SHORTNESS OF BREATH *UNUSUAL BRUISING OR BLEEDING *URINARY PROBLEMS (pain or burning when urinating, or frequent urination) *BOWEL PROBLEMS (unusual diarrhea, constipation, pain near the anus) TENDERNESS IN MOUTH AND THROAT WITH OR WITHOUT PRESENCE OF ULCERS (sore throat, sores in mouth, or a toothache) UNUSUAL RASH, SWELLING OR PAIN  UNUSUAL VAGINAL DISCHARGE OR ITCHING   Items with * indicate a potential emergency and should be followed up as soon as possible or go to the Emergency Department if any problems should occur.  Please show the CHEMOTHERAPY ALERT CARD or IMMUNOTHERAPY ALERT CARD at  check-in to the Emergency Department and triage nurse.  Should you have questions after your visit or need to cancel or reschedule your appointment, please contact [facility]ICAL [facility]COL[facility]  Dept: [phone]  and follow the prompts.  Office hours are 8:00 a.m. to 4:30 p.m. Monday - Friday. Please note that voicemails left after 4:00 p.m. may not be returned until the following business day.  We are closed weekends and major holidays. You have access to a nurse at all times for urgent questions. Please call the main number to the clinic Dept: [phone] and follow the prompts.   For any non-urgent questions, you may also contact your provider using MyChart. We now offer e-Visits for anyone 18 and older to request care online for non-urgent symptoms. For details visit [email or web address].   Also download the MyChart app! Go to the app store, search "MyChart", open the app, select [facility], and log in with your MyChart username and password.  Due to Covid, a mask is required upon entering the hospital/clinic. If you do not have a mask, one will be given to you upon arrival. For doctor visits, patients may have 1 support person aged 18 or older with them. For treatment visits, patients cannot have anyone with them due to current Covid guidelines and our immunocompromised population.   

## 2021-02-28 ENCOUNTER — Telehealth: Payer: Self-pay | Admitting: Physician Assistant

## 2021-02-28 NOTE — Telephone Encounter (Signed)
Scheduled appt per 8/31 los - patient to get an updated schedule at next visit.

## 2021-03-05 ENCOUNTER — Ambulatory Visit (INDEPENDENT_AMBULATORY_CARE_PROVIDER_SITE_OTHER): Payer: Medicare HMO | Admitting: *Deleted

## 2021-03-05 DIAGNOSIS — C3411 Malignant neoplasm of upper lobe, right bronchus or lung: Secondary | ICD-10-CM

## 2021-03-05 DIAGNOSIS — I10 Essential (primary) hypertension: Secondary | ICD-10-CM

## 2021-03-05 DIAGNOSIS — C7931 Secondary malignant neoplasm of brain: Secondary | ICD-10-CM

## 2021-03-05 NOTE — Chronic Care Management (AMB) (Signed)
Chronic Care Management   CCM RN Visit Note  03/05/2021 Name: Hannah [name] MRN: [id number] DOB: Nov 23, 1950  Subjective: Hannah [name] is a 70 y.o. year old female who is a primary care patient of Burns, Claudina Lick, MD. The care management team was consulted for assistance with disease management and care coordination needs.    Engaged with patient and her daughter- caregiver [name], on [facility] D[facility] by telephone for follow up visit in response to provider referral for case management and/or care coordination services.   Consent to Services:  The patient was given information about Chronic Care Management services, agreed to services, and gave verbal consent prior to initiation of services.  Please see initial visit note for detailed documentation.  Patient agreed to services and verbal consent obtained.   Assessment: Review of patient past medical history, allergies, medications, health status, including review of consultants reports, laboratory and other test data, was performed as part of comprehensive evaluation and provision of chronic care management services.   SDOH (Social Determinants of Health) assessments and interventions performed:  SDOH Interventions    Flowsheet Row Most Recent Value  SDOH Interventions   Food Insecurity Interventions Intervention Not Indicated  Transportation Interventions Intervention Not Indicated  [daughter/ caregiver continues to provide transportation prn- patient has started driving self for short errands/ to the gym for exercise, etc]      CCM Care Plan Allergies  Allergen Reactions   Amlodipine Swelling and Rash    Rash, swelling   Chantix [Varenicline Tartrate] Shortness Of Breath, Swelling and Other (See Comments)    Tongue swell,sob   Clarithromycin Rash   Lisinopril Hives   Simvastatin Hives   Wellbutrin [Bupropion Hcl] Hives   Lipitor [Atorvastatin]     Dizziness per patient   Sertraline     Makes her feel like she is going to kill  someone   Outpatient Encounter Medications as of 03/05/2021  Medication Sig Note   acetaminophen (TYLENOL) 500 MG tablet Take 2 tablets (1,000 mg total) by mouth every 6 (six) hours.    ALPRAZolam (XANAX XR) 1 MG 24 hr tablet 1  qhs    ALPRAZolam (XANAX) 0.5 MG tablet 2  qam   2  $'@2'X$ ;00 02/07/2021: Takes at 8 am and 2pm sometimes.   amoxicillin-clavulanate (AUGMENTIN) 250-62.5 MG/5ML suspension Take 5 mLs (250 mg total) by mouth 3 (three) times daily.    Ascorbic Acid (VITAMIN C) 1000 MG tablet Take 1,000 mg by mouth daily.    aspirin 81 MG tablet Take 1 tablet (81 mg total) by mouth daily. Restart on 08/31/20    carboxymethylcellulose (REFRESH PLUS) 0.5 % SOLN Place 1-2 drops into both eyes daily.    citalopram (CELEXA) 10 MG tablet Take 1 tablet (10 mg total) by mouth daily.    folic acid (FOLVITE) 1 MG tablet Take 1 tablet (1 mg total) by mouth daily.    hydrochlorothiazide (HYDRODIURIL) 12.5 MG tablet Take 1 tablet (12.5 mg total) by mouth daily. (Patient not taking: No sig reported) 03/05/2021: 03/05/21- reports continuing to hold based on recent blood pressure readings at home   ketoconazole (NIZORAL) 2 % cream Apply 1 application topically 2 (two) times daily.    loratadine (CLARITIN) 10 MG tablet Take 10 mg by mouth daily as needed for allergies.    mirtazapine (REMERON) 45 MG tablet Take 1 tablet (45 mg total) by mouth at bedtime.    neomycin-bacitracin-polymyxin (NEOSPORIN) ointment Apply 1 application topically as needed for wound care. (Patient not  taking: Reported on 02/21/2021)    omeprazole (PRILOSEC) 40 MG capsule TAKE 1 CAPSULE (40 MG TOTAL) BY MOUTH 2 (TWO) TIMES DAILY BEFORE A MEAL.    polyethylene glycol (MIRALAX / GLYCOLAX) 17 g packet Take 17 g by mouth 2 (two) times daily.    pravastatin (PRAVACHOL) 40 MG tablet TAKE 1 TABLET (40 MG TOTAL) BY MOUTH EVERY EVENING.    Probiotic Product (PROBIOTIC DAILY PO) Take 1 capsule by mouth daily.    prochlorperazine (COMPAZINE) 10 MG tablet  Take 1 tablet (10 mg total) by mouth every 6 (six) hours as needed for nausea or vomiting. (Patient not taking: No sig reported) 10/30/2020: 10/30/20: reports has not needed recently   senna-docusate (SENOKOT-S) 8.6-50 MG tablet Take 1 tablet by mouth 2 (two) times daily. (Patient taking differently: Take 1 tablet by mouth daily as needed for mild constipation.)    simethicone (MYLICON) 80 MG chewable tablet Chew 1 tablet (80 mg total) by mouth 4 (four) times daily as needed for flatulence (Bloating).    Wheat Dextrin (BENEFIBER) POWD Take 1 Scoop by mouth 2 (two) times daily as needed (constipation). 10/30/2020: 10/30/20: patient reports has not needed recently    No facility-administered encounter medications on file as of 03/05/2021.   Patient Active Problem List   Diagnosis Date Noted   Sinusitis 02/21/2021   Allergic rhinitis 02/21/2021   Encounter for antineoplastic chemotherapy 10/23/2020   Encounter for antineoplastic immunotherapy 10/23/2020   Anemia 10/19/2020   Sleep difficulties 10/19/2020   S/P robot-assisted surgical procedure 09/24/2020   Aortic atherosclerosis ([place]) 09/12/2020   Lung mass 09/04/2020   Status post craniotomy 08/24/2020   Brain tumor ([place]) 08/24/2020   Hematochezia 08/19/2020   Acute blood loss anemia    Rectal bleeding 08/17/2020   Primary cancer of right upper lobe of lung ([place]) 08/07/2020   Brain metastasis ([place]) 08/06/2020   Memory changes 07/24/2020   Non-recurrent unilateral inguinal hernia without obstruction or gangrene 07/24/2020   Pain of right clavicle 09/12/2019   Nonspecific abnormal electrocardiogram (ECG) (EKG) 10/20/2018   Educated about COVID-19 virus infection 10/20/2018   Weight loss 07/17/2018   Depression 07/17/2018   Primary osteoarthritis of right knee 06/04/2018   Bilateral carotid artery stenosis 09/24/2017   Aortic valve sclerosis 09/24/2017   Vocal cord polyps 09/07/2017   Dysphagia 09/07/2017   Diabetes mellitus without  complication ([place]) 96/22/2979   GERD (gastroesophageal reflux disease) 02/27/2016   Anxiety 02/27/2016   S/P total knee replacement 02/13/2014   RBBB 02/08/2014   HTN (hypertension) 02/08/2014   PVD - bilateral 60-79% carotid strenosis 02/08/2014   Mixed hyperlipidemia 11/29/2013   Carpal tunnel syndrome, bilateral 11/23/2013   Murmur- mild -mod AR, mild MR 11/10/2013   Constipation 12/16/2012   Conditions to be addressed/monitored:  HTN and cancer  Care Plan : RN Care Manager Plan of Care  Updates made by Knox Royalty, RN since 03/05/2021 12:00 AM     Problem: Chronic Disease Management Needs   Priority: Medium     Long-Range Goal: Development of Plan of care for long-term chronic disease management   Start Date: 12/27/2020  Expected End Date: 12/27/2020  Priority: Medium  Note:   Current Barriers:  Chronic Disease Management support and education needs related to HTN and Cancer Recent surgery x 2 for cancer treatment- ongoing recuperation/ pain; recent metastases to brain   RNCM Clinical Goal(s):  Patient will demonstrate ongoing health management independence for HTN/ cancer  through collaboration with RN Care manager,  provider, and care team.   Interventions: 1:1 collaboration with primary care provider regarding development and update of comprehensive plan of care as evidenced by provider attestation and co-signature Inter-disciplinary care team collaboration (see longitudinal plan of care) Evaluation of current treatment plan related to  self management and patient's adherence to plan as established by provider  Hypertension and lung cancer with mets to brain: (Status: Goal on track: YES.) Last practice recorded BP readings:  BP Readings from Last 3 Encounters:  02/27/21 111/63  02/21/21 106/67  02/13/21 112/66  Most recent eGFR/CrCl: No results found for: EGFR  No components found for: CRCL  Evaluation of current treatment plan related to hypertension self  management and patient's adherence to plan as established by provider;   Reviewed prescribed diet low salt, heart healthy Counseled on the importance of exercise goals with target of 150 minutes per week Discussed plans with patient for ongoing care management follow up and provided patient with direct contact information for care management team; Reviewed scheduled/upcoming provider appointments including:  Screening for signs and symptoms of depression related to chronic disease state;  Assessed social determinant of health barriers; Updated SDOH today Chart reviewed including relevant office notes, upcoming scheduled appointments, and lab results Confirmed with patient and caregiver/ daughter that patient has continues monitoring/ recording blood pressures at home: they have remained "on lower side;" confirmed that patient continues to hold previously prescribed BP medication, as per PCP advice- daughter reports they will take recorded blood pressures from home to upcoming scheduled PCP appointment 03/20/21 Caregiver unable to review specific blood pressure from home today: she is at work and does not have the recorded values with her Confirmed patient has recuperated well from recent "bug" post- shingles vaccination: reports took antibiotics as prescribed; sinusitis/ "heart racing" symptoms have now resolved Confirmed patient tolerating round 6 of chemo "well;" she confirms she is able to drive some to short errands; is going to church; is going to the gym to walk-- walks over a mile on some days when she has plenty of energy Continues active with psychiatry provider: this was encouraged; she reports her anxiety "better" with psychiatry involvement- states "still not having crying spells" Confirmed with patient that appetite is good; weight currently reported at "114 lbs" Reviewed upcoming provider appointments with patient and caregiver and confirmed that patient has plans to attend all as  scheduled; daughter will transport and attend alongside patient:  rotating chemotherapy / oncology provider appointments: 03/21/21; 04/11/21; 05/02/21; 03/14/21: CT scan abdomen/ pelvis; 03/19/21: vascular/ carotid screening; 03/20/21: PCP office visit; 04/19/21: cardiology provider; 11/15- Dr. Hendrickson/ CVTS follow up; 05/21/21: Dr. Mickeal Skinner, oncology; 05/31/21: MRI- brain Discussed plans with patient for ongoing care management follow up and provided patient with direct contact information for care management team  Patient Goals/Self-Care Activities: Patient will self administer medications as prescribed Patient will attend all scheduled provider appointments Patient will call pharmacy for medication refills Patient will attend church or other social activities Patient will call provider office for new concerns or questions Keep up the great work staying active and following a low-salt, heart healthy diet Continue to monitor and write down on paper your blood pressures from home     Plan: Telephone follow up appointment with care management team member scheduled for:  Thursday June 06, 2021 at 2:00 pm The patient has been provided with contact information for the care management team and has been advised to call with any health related questions or concerns  [name], RN,  BSN, [place] [phone]: direct office [phone]: mobile

## 2021-03-05 NOTE — Patient Instructions (Signed)
Visit Information  Cybil and [name], it was nice talking with you today.    I look forward to talking to you again for an update on Thursday June 06, 2021 at 2:00 pm- please be listening out for my call that day.  I will call as close to 2:00 pm as possible.   If you need to cancel or re-schedule our telephone visit, please call [phone] and one of our care guides will be happy to assist you.   I look forward to hearing about your progress.   Please don't hesitate to contact me if I can be of assistance to you before our next scheduled telephone appointment.   [name], RN, BSN, [facility] RN Care Coordination- [place] [phone]: direct office [phone]: mobile   PATIENT GOALS:  Goals Addressed             This Visit's Progress               Patient Self-Care Activities   On track    Timeframe:  Long-Range Goal Priority:  Medium Start Date:      12/27/20                       Expected End Date:     12/27/21                  Patient will self administer medications as prescribed Patient will attend all scheduled provider appointments Patient will call pharmacy for medication refills Patient will attend church or other social activities Patient will call provider office for new concerns or questions Keep up the great work staying active and following a low-salt, heart healthy diet Continue to monitor and write down on paper your blood pressures from home                  Patient/ caregiver verbalizes understanding of instructions provided today and agrees to view in [place] Telephone follow up appointment with care management team member scheduled for:  Thursday June 06, 2021 at 2:00 pm The patient has been provided with contact information for the care management team and has been advised to call with any health related questions or concerns  [name], RN, BSN, [place] [phone]: direct office [phone]: mobile

## 2021-03-14 ENCOUNTER — Ambulatory Visit
Admission: RE | Admit: 2021-03-14 | Discharge: 2021-03-14 | Disposition: A | Discharge status: Home / Self Care | Payer: Medicare HMO | Source: Ambulatory Visit | Attending: Physician Assistant | Admitting: Physician Assistant

## 2021-03-14 ENCOUNTER — Other Ambulatory Visit (HOSPITAL_COMMUNITY): Payer: Self-pay | Admitting: Internal Medicine

## 2021-03-14 DIAGNOSIS — J90 Pleural effusion, not elsewhere classified: Secondary | ICD-10-CM | POA: Diagnosis not present

## 2021-03-14 DIAGNOSIS — C3411 Malignant neoplasm of upper lobe, right bronchus or lung: Secondary | ICD-10-CM

## 2021-03-14 DIAGNOSIS — N8189 Other female genital prolapse: Secondary | ICD-10-CM | POA: Diagnosis not present

## 2021-03-14 DIAGNOSIS — I2510 Atherosclerotic heart disease of native coronary artery without angina pectoris: Secondary | ICD-10-CM | POA: Diagnosis not present

## 2021-03-14 DIAGNOSIS — C7931 Secondary malignant neoplasm of brain: Secondary | ICD-10-CM | POA: Diagnosis not present

## 2021-03-14 DIAGNOSIS — I6523 Occlusion and stenosis of bilateral carotid arteries: Secondary | ICD-10-CM

## 2021-03-14 DIAGNOSIS — I700 Atherosclerosis of aorta: Secondary | ICD-10-CM | POA: Diagnosis not present

## 2021-03-14 MED ORDER — IOPAMIDOL (ISOVUE-370) INJECTION 76%
80.0000 mL | Freq: Once | INTRAVENOUS | Status: AC | PRN
  Administered 2021-03-14: 80 mL via INTRAVENOUS

## 2021-03-15 ENCOUNTER — Telehealth: Payer: Self-pay | Admitting: Cardiology

## 2021-03-15 NOTE — Telephone Encounter (Signed)
Spoke to daughter-aware no prep for carotid US scheduled 9/20

## 2021-03-15 NOTE — Telephone Encounter (Signed)
Pt have an appt next week she wants to know if there is anything special she needs to prepare for before the appt. Please advise pt further

## 2021-03-18 NOTE — Progress Notes (Signed)
[place] OFFICE PROGRESS NOTE  [name], MD [place] [address]  DIAGNOSIS:  Stage IV non-small cell lung cancer (T3, N0, M1C) adenocarcinoma.  The patient presented with a right upper lobe lung mass and solitary brain metastasis.  The patient was diagnosed in February 2022.   Biomarker Findings Microsatellite status - MS-Stable Tumor Mutational Burden - 8 Muts/Mb Genomic Findings For a complete list of the genes assayed, please refer to the Appendix. KRAS G12V KEAP1 S224F TP53 P151T 7 Disease relevant genes with no reportable alterations: ALK, BRAF, EGFR, ERBB2, MET, RET, ROS1   PDL1 Expression: 90%  PRIOR THERAPY: 1) SRS to the metastatic brain lesion on 08/23/2020 under the care of Dr. Tammi Klippel and craniotomy under the care of Dr. Zada Finders on 08/24/2020. 2) S/p robotic assisted right upper lobectomy with en bloc wedge resection of the right middle lobe and lymph node dissection under the care of Dr. Roxan Hockey on September 24, 2020 3) SRS to the two new subcentimeter metastases under the care of Dr. Tammi Klippel on 11/29/20.   CURRENT THERAPY:   Systemic chemotherapy with carboplatin for AUC of 5, Alimta 500 Mg/M2 and Keytruda 200 mg IV every 3 weeks.  First dose Nov 08, 2020. Status post 6 cycles.  Starting from cycle #5, the patient started maintenance Alimta and Keytruda.  INTERVAL HISTORY: Hannah [name] 70 y.o. female returns to the clinic today for a follow-up visit accompanied by her daughter. The patient is feeling fairly well today without any concerning complaints.The patient tolerated her last cycle of treatment fairly well without any concerning adverse side effects except some taste alterations. No evidence of thrush on exam. She also saw her PCP yesterday for allergies. She is going to start taking zyrtec. She denies any recent chills, fevers, or night sweats. She takes remeron for her appetite and depression. She reports she now has a  good appetite. Her weight is stable. She denies any nausea, vomiting, diarrhea, or constipation. He denies any rashes or skin changes.  She denies any headaches. She denies any chest pain, shortness of breath, cough, or hemoptysis. She recently had a restaging CT scan performed. She is here for evaluation and to review her scan results before starting cycle #7.     MEDICAL HISTORY: Past Medical History:  Diagnosis Date   Allergy    Anemia    Anxiety    Arthritis    Carpal tunnel syndrome    Constipation    senna C stool softeners help    Depression    Diverticulosis    Dyslipidemia    External hemorrhoids    GERD (gastroesophageal reflux disease)    Heart murmur    mild-moderate AR   Hiatal hernia    Hyperlipidemia    on meds    Hypertension    Internal hemorrhoids    lung ca with brain mets 07/2020   Osteoarthritis    Pre-diabetes    PVD (peripheral vascular disease) (HCC)    moderate carotid disease   RBBB    Smoker    Vocal cord polyps     ALLERGIES:  is allergic to amlodipine, chantix [varenicline tartrate], clarithromycin, lisinopril, simvastatin, wellbutrin [bupropion hcl], lipitor [atorvastatin], and sertraline.  MEDICATIONS:  Current Outpatient Medications  Medication Sig Dispense Refill   acetaminophen (TYLENOL) 500 MG tablet Take 2 tablets (1,000 mg total) by mouth every 6 (six) hours. 30 tablet 0   ALPRAZolam (XANAX XR) 1 MG 24 hr tablet 1  qhs 30 tablet 4   ALPRAZolam (XANAX) 0.5 MG tablet 2  qam   2  $'@2'H$ ;00 120 tablet 5   Ascorbic Acid (VITAMIN C) 1000 MG tablet Take 1,000 mg by mouth daily.     aspirin 81 MG tablet Take 1 tablet (81 mg total) by mouth daily. Restart on 08/31/20 30 tablet    carboxymethylcellulose (REFRESH PLUS) 0.5 % SOLN Place 1-2 drops into both eyes daily.     citalopram (CELEXA) 10 MG tablet Take 1 tablet (10 mg total) by mouth daily. 30 tablet 1   folic acid (FOLVITE) 1 MG tablet Take 1 tablet (1 mg total) by mouth daily. 30 tablet 4    loratadine (CLARITIN) 10 MG tablet Take 10 mg by mouth daily as needed for allergies.     mirtazapine (REMERON) 45 MG tablet Take 1 tablet (45 mg total) by mouth at bedtime. 30 tablet 3   omeprazole (PRILOSEC) 40 MG capsule TAKE 1 CAPSULE (40 MG TOTAL) BY MOUTH 2 (TWO) TIMES DAILY BEFORE A MEAL. 180 capsule 0   polyethylene glycol (MIRALAX / GLYCOLAX) 17 g packet Take 17 g by mouth 2 (two) times daily. 72 each 0   pravastatin (PRAVACHOL) 40 MG tablet TAKE 1 TABLET (40 MG TOTAL) BY MOUTH EVERY EVENING. 90 tablet 0   Probiotic Product (PROBIOTIC DAILY PO) Take 1 capsule by mouth daily.     prochlorperazine (COMPAZINE) 10 MG tablet Take 1 tablet (10 mg total) by mouth every 6 (six) hours as needed for nausea or vomiting. 30 tablet 0   senna-docusate (SENOKOT-S) 8.6-50 MG tablet Take 1 tablet by mouth 2 (two) times daily. (Patient taking differently: Take 1 tablet by mouth daily as needed for mild constipation.)     simethicone (MYLICON) 80 MG chewable tablet Chew 1 tablet (80 mg total) by mouth 4 (four) times daily as needed for flatulence (Bloating). 30 tablet 0   Wheat Dextrin (BENEFIBER) POWD Take 1 Scoop by mouth 2 (two) times daily as needed (constipation).     No current facility-administered medications for this visit.    SURGICAL HISTORY:  Past Surgical History:  Procedure Laterality Date   ABDOMINAL HYSTERECTOMY  2778   APPLICATION OF CRANIAL NAVIGATION N/A 08/24/2020   Procedure: APPLICATION OF CRANIAL NAVIGATION;  Surgeon: Judith Part, MD;  Location: [facility];  Service: Neurosurgery;  Laterality: N/A;   COLONOSCOPY     COLONOSCOPY W/ POLYPECTOMY  2009   CRANIOTOMY Left 08/24/2020   Procedure: Left Craniotomy for Tumor Resection with Brainlab;  Surgeon: Judith Part, MD;  Location: [facility];  Service: Neurosurgery;  Laterality: Left;   HEMORRHOID SURGERY     INTERCOSTAL NERVE BLOCK Right 09/24/2020   Procedure: INTERCOSTAL NERVE BLOCK;  Surgeon: Melrose Nakayama, MD;   Location: [facility];  Service: Thoracic;  Laterality: Right;   JOINT REPLACEMENT     LYMPH NODE DISSECTION Right 09/24/2020   Procedure: LYMPH NODE DISSECTION;  Surgeon: Melrose Nakayama, MD;  Location: [facility];  Service: Thoracic;  Laterality: Right;   POLYPECTOMY     right total knee arthroplasty     Dr. Emeterio Reeve 06-04-18   [place]  08/16/2009   TOTAL KNEE ARTHROPLASTY Left 02/13/2014   Procedure: TOTAL KNEE ARTHROPLASTY;  Surgeon: Alta Corning, MD;  Location: [facility];  Service: Orthopedics;  Laterality: Left;   TOTAL KNEE ARTHROPLASTY Right 06/04/2018   Procedure: RIGHT TOTAL KNEE ARTHROPLASTY;  Surgeon: Dorna Leitz, MD;  Location: [facility];  Service: Orthopedics;  Laterality:  Right;  Adductor Block   TUBAL LIGATION      REVIEW OF SYSTEMS:   Review of Systems  Constitutional: Positive for fatigue. Negative for appetite change, chills, fever and unexpected weight change. HENT: Positive for taste alterations. Negative for mouth sores, nosebleeds, sore throat and trouble swallowing.   Eyes: Negative for eye problems and icterus. Respiratory: Negative for cough, hemoptysis, shortness of breath and wheezing.   Cardiovascular: Negative for chest pain and leg swelling. Gastrointestinal: Negative for abdominal pain, constipation, diarrhea, nausea and vomiting. Genitourinary: Negative for bladder incontinence, difficulty urinating, dysuria, frequency and hematuria.   Musculoskeletal: Negative for back pain, gait problem, neck pain and neck stiffness. Skin: Negative for itching and rash. Neurological: Negative for dizziness, extremity weakness, gait problem, headaches, light-headedness and seizures. Hematological: Negative for adenopathy. Does not bruise/bleed easily. Psychiatric/Behavioral: Negative for confusion, depression and sleep disturbance. The patient is not nervous/anxious.   PHYSICAL EXAMINATION:  Blood pressure (!) 146/72, pulse 72, temperature 98.7 F (37.1 C), resp. rate 18,  weight 114 lb 12.8 oz (52.1 kg), SpO2 100 %.  ECOG PERFORMANCE STATUS: 1  Physical Exam  Constitutional: Oriented to person, place, and time and thin appearing female and in no distress. HENT: Head: Normocephalic and atraumatic. Mouth/Throat: Oropharynx is clear and moist. No oropharyngeal exudate. No thrush.  Eyes: Conjunctivae are normal. Right eye exhibits no discharge. Left eye exhibits no discharge. No scleral icterus. Neck: Normal range of motion. Neck supple. Cardiovascular: Normal rate, regular rhythm, systolic murmur noted and intact distal pulses.   Pulmonary/Chest: Effort normal. Decreased breath sounds in right lung base. No respiratory distress. No wheezes. No rales. Abdominal: Soft. Bowel sounds are normal. Exhibits no distension and no mass. There is no tenderness.  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 No weakness in hands. No decreased sensation. No swelling or erythema. Some tenderness to palpation over MCP. Normal range of motion. Exhibits no edema.  Lymphadenopathy:    No cervical adenopathy.  Neurological: Alert and oriented to person, place, and time. Exhibits normal muscle tone. Gait normal. Coordination normal. Skin: Skin is warm and dry. No rash noted. Not diaphoretic. No erythema. No pallor.  Psychiatric: Mood, memory and judgment normal. Vitals reviewed  LABORATORY DATA: Lab Results  Component Value Date   WBC 4.3 03/21/2021   HGB 9.6 (L) 03/21/2021   HCT 31.5 (L) 03/21/2021   MCV 88.2 03/21/2021   PLT 384 03/21/2021      Chemistry      Component Value Date/Time   NA 142 03/21/2021 0929   K 3.9 03/21/2021 0929   CL 106 03/21/2021 0929   CO2 27 03/21/2021 0929   BUN 6 (L) 03/21/2021 0929   CREATININE 0.75 03/21/2021 0929   CREATININE 0.79 03/14/2020 1016      Component Value Date/Time   CALCIUM 10.0 03/21/2021 0929   ALKPHOS 74 03/21/2021 0929   AST 22 03/21/2021 0929   ALT 11 03/21/2021 0929   BILITOT 0.3 03/21/2021 0929        RADIOGRAPHIC STUDIES:  CT CHEST ABDOMEN PELVIS W CONTRAST  Result Date: 03/14/2021 CLINICAL DATA:  Status post right upper lobectomy for lung cancer with chemotherapy. Hysterectomy. Ex-smoker. Known brain metastasis. EXAM: CT CHEST, ABDOMEN, AND PELVIS WITH CONTRAST TECHNIQUE: Multidetector CT imaging of the chest, abdomen and pelvis was performed following the standard protocol during bolus administration of intravenous contrast. CONTRAST:  23mL ISOVUE-370 IOPAMIDOL (ISOVUE-370) INJECTION 76% COMPARISON:  01/16/2021 FINDINGS: CT CHEST FINDINGS Cardiovascular: Aortic atherosclerosis. Tortuous thoracic aorta. Normal heart  size, without pericardial effusion. Lad coronary artery calcification. No central pulmonary embolism, on this non-dedicated study. Mediastinum/Nodes: No supraclavicular adenopathy. No mediastinal or hilar adenopathy. Lungs/Pleura: Small right pleural effusion, similar. Status post right upper lobectomy. Musculoskeletal: No acute osseous abnormality. CT ABDOMEN PELVIS FINDINGS Hepatobiliary: Normal liver. Normal gallbladder, without biliary ductal dilatation. Pancreas: Normal, without mass or ductal dilatation. Spleen: Normal in size, without focal abnormality. Adrenals/Urinary Tract: Normal adrenal glands. Normal kidneys, without hydronephrosis. Normal urinary bladder. Stomach/Bowel: Normal stomach, without wall thickening. Normal colon, appendix, and terminal ileum. Normal small bowel. Vascular/Lymphatic: Aortic atherosclerosis. No abdominopelvic adenopathy. Reproductive: Hysterectomy.  No adnexal mass. Other: Moderate pelvic floor laxity. No significant free fluid. No free intraperitoneal air. No evidence of omental or peritoneal disease. Musculoskeletal: Lipoma within the right thigh musculature including at 1.9 cm. L3-5 fixation. Advanced thoracolumbar spondylosis. IMPRESSION: 1. Status post right upper lobectomy. No findings of recurrent or metastatic disease. 2. Similar small right  pleural effusion. 3. Coronary artery atherosclerosis. Aortic Atherosclerosis (ICD10-I70.0). Electronically Signed   By: Abigail Miyamoto M.D.   On: 03/14/2021 20:45   VAS US CAROTID  Result Date: 03/20/2021 Carotid Arterial Duplex Study Patient Name:  Hannah [name]  Date of Exam:   03/19/2021 Medical Rec #: [id number]        Accession #:    [id number] Date of Birth: 23-Mar-1951         Patient Gender: F Patient Age:   39 years Exam Location:  [facility] Procedure:      VAS US CAROTID Referring Phys: Marzetta Board BURNS --------------------------------------------------------------------------------  Indications:       Carotid artery disease and patient reports occasional blurred                    vision. She denies any other cerebrovascular symptoms. Risk Factors:      Hypertension, hyperlipidemia, Diabetes, past history of                    smoking. Comparison Study:  In 02/2020, a carotid duplex showed velocities of 134/49 cm/s                    in the RICA and 412/87 cm/s in the LICA. Performing Technologist: Sharlett Iles RVT  Examination Guidelines: A complete evaluation includes B-mode imaging, spectral Doppler, color Doppler, and power Doppler as needed of all accessible portions of each vessel. Bilateral testing is considered an integral part of a complete examination. Limited examinations for reoccurring indications may be performed as noted.  Right Carotid Findings: +----------+--------+--------+--------+------------------+------------------+           PSV cm/sEDV cm/sStenosisPlaque DescriptionComments           +----------+--------+--------+--------+------------------+------------------+ CCA Prox  57      11                                                   +----------+--------+--------+--------+------------------+------------------+ CCA Distal45      12                                intimal thickening +----------+--------+--------+--------+------------------+------------------+ ICA Prox   131     48      40-59%  hyperechoic                          +----------+--------+--------+--------+------------------+------------------+  ICA Mid   181     50                                tortuous           +----------+--------+--------+--------+------------------+------------------+ ICA Distal47      20                                tortuous           +----------+--------+--------+--------+------------------+------------------+ ECA       95      7               heterogenous                         +----------+--------+--------+--------+------------------+------------------+ +----------+--------+-------+---------+-------------------+           PSV cm/sEDV cmsDescribe Arm Pressure (mmHG) +----------+--------+-------+---------+-------------------+ Subclavian101            Turbulent122                 +----------+--------+-------+---------+-------------------+ +---------+--------+--+--------+--+---------+ VertebralPSV cm/s41EDV cm/s14Antegrade +---------+--------+--+--------+--+---------+ Prominent right subclavian artery, measuring 1.7 cm. Left Carotid Findings: +----------+--------+--------+--------+-----------------+----------------------+           PSV cm/sEDV cm/sStenosisPlaque           Comments                                                 Description                             +----------+--------+--------+--------+-----------------+----------------------+ CCA Prox  54      8                                                       +----------+--------+--------+--------+-----------------+----------------------+ CCA Distal40      8                                intimal thickening     +----------+--------+--------+--------+-----------------+----------------------+ ICA Prox  232     81      60-79%  hyperechoic                             +----------+--------+--------+--------+-----------------+----------------------+ ICA Mid    101     35                               tortuous and turbulent +----------+--------+--------+--------+-----------------+----------------------+ ICA Distal91      31                               tortuous               +----------+--------+--------+--------+-----------------+----------------------+ ECA       99      8  heterogenous     turbulent              +----------+--------+--------+--------+-----------------+----------------------+ +----------+--------+--------+----------------+-------------------+           PSV cm/sEDV cm/sDescribe        Arm Pressure (mmHG) +----------+--------+--------+----------------+-------------------+ [id number]             Multiphasic, GOT157                 +----------+--------+--------+----------------+-------------------+ +---------+--------+--+--------+--+---------+ VertebralPSV cm/s46EDV cm/s19Antegrade +---------+--------+--+--------+--+---------+   Summary: Right Carotid: Velocities in the right ICA are consistent with a 40-59%                stenosis. Stable RICA velocities. Left Carotid: Velocities in the left ICA are consistent with a 60-79% stenosis.               Stable LICA velocities. Vertebrals:  Bilateral vertebral arteries demonstrate antegrade flow. Subclavians: Right subclavian artery flow was disturbed. Normal flow              hemodynamics were seen in the left subclavian artery. Prominent              right subclavian artery, measuring 1.7 cm. *See table(s) above for measurements and observations. Suggest follow up study in 12 months. Electronically signed by Kathlyn Sacramento MD on 03/20/2021 at 4:26:17 PM.    Final      ASSESSMENT/PLAN:  This is a very pleasant 70 year old African-American female diagnosed with stage IV (T3, N0, M1 B) non-small cell lung cancer, adenocarcinoma.  She presented with a right upper lobe lung mass with a solitary brain metastasis.  She was diagnosed in February 2022.  Her PD-L1  expression is 90%.  She does not have any actionable mutations.   The patient completed SRS followed by craniotomy and resection on 08/23/2020 under the care of Dr. Tammi Klippel and craniotomy under the care of Dr. Zada Finders on 08/24/2020.   She then had a robot-assisted right upper lobectomy with en bloc wedge resection of the right middle lobe and lymph node dissection under the care of Dr. Roxan Hockey on September 24, 2020.   She underwent SRS to the two new subcentimeter brain metastases on 11/29/20.   The patient is currently undergoing systemic chemotherapy with carboplatin for an AUC of 5, Alimta 500 mg per metered squared, Keytruda 200 mg IV every 3 weeks.  She is status post 6 cycles and tolerated it well without any adverse side effects.  Starting from cycle #5, the patient started maintenance Alimta and Keytruda.   The patient recently had a restaging CT scan performed.  Dr. Julien Nordmann personally and independently reviewed the scan and discussed results with the patient today.  The scan showed no evidence for disease progression recommend that she continue on the same treatment at the same dose.  She will proceed with cycle #7 today scheduled.  We will see her back for follow-up visit in 3 weeks for evaluation before starting cycle #9.  She will continue taking Remeron for her decreased appetite.  Advised to use salt water rinses and biotene for her taste alterations. No evidence of thrush on exam.   The patient was advised to call immediately if she has any concerning symptoms in the interval. The patient voices understanding of current disease status and treatment options and is in agreement with the current care plan. All questions were answered. The patient knows to call the clinic with any problems, questions or concerns. We can certainly see the patient much sooner if  necessary     No orders of the defined types were placed in this encounter.     Kayde Warehime L Kenric Ginger,  PA-C 03/21/21  ADDENDUM: Hematology/Oncology Attending: I had a face-to-face encounter with the patient.  I reviewed her records, lab and scan and recommended her care plan.  This is a very pleasant 70 years old African-American female with a stage IV (T3, N0, M1 B) non-small cell lung cancer, adenocarcinoma presented with large right upper lobe lung mass in addition to solitary brain metastasis initially in February 2022 with PD-L1 expression of 90% and no actionable mutations. The patient underwent SRS to solitary brain metastasis followed by surgical resection.  She also underwent right upper lobectomy with lymph node dissection under the care of Dr. Roxan Hockey.  The patient developed 2 new brain metastasis in June 2022 and she underwent SRS treatment to these lesions. She is currently on systemic chemotherapy initially with carboplatin for AUC of 5, Alimta 500 Mg/M2 and Keytruda 200 Mg IV every 3 weeks for 4 cycles and currently on maintenance treatment with Alimta and Keytruda for 2 more cycles. The patient has been tolerating her treatment well except for fatigue. She had repeat CT scan of the chest, abdomen pelvis performed recently.  I personally and independently reviewed the scans and discussed the results with the patient and her daughter. Her scan showed no concerning findings for disease progression. I recommended for the patient to continue her current maintenance treatment with Alimta and Keytruda and she will proceed with cycle #7 today. For the lack of appetite, she will continue on Remeron. She will come back for follow-up visit in 3 weeks for evaluation before the next cycle of her treatment. The patient was advised to call immediately if she has any other concerning symptoms in the interval. Disclaimer: This note was dictated with voice recognition software. Similar sounding words can inadvertently be transcribed and may be missed upon review. Eilleen Kempf, MD 03/22/21

## 2021-03-19 ENCOUNTER — Ambulatory Visit (HOSPITAL_COMMUNITY)
Admission: RE | Admit: 2021-03-19 | Discharge: 2021-03-19 | Disposition: A | Discharge status: Home / Self Care | Payer: Medicare HMO | Source: Ambulatory Visit | Attending: Cardiology | Admitting: Cardiology

## 2021-03-19 ENCOUNTER — Other Ambulatory Visit (HOSPITAL_COMMUNITY): Payer: Self-pay | Admitting: Internal Medicine

## 2021-03-19 ENCOUNTER — Other Ambulatory Visit: Payer: Self-pay

## 2021-03-19 DIAGNOSIS — I6523 Occlusion and stenosis of bilateral carotid arteries: Secondary | ICD-10-CM | POA: Insufficient documentation

## 2021-03-19 NOTE — Assessment & Plan Note (Signed)
Chronic Had [place] of carotids yesterday - stable R 40-59%, L 60-79% compared to last year Continue pravastatin

## 2021-03-19 NOTE — Progress Notes (Signed)
Subjective:    Patient ID: Hannah [name], female    DOB: 10-10-1950, 70 y.o.   MRN: [id number]   This visit occurred during the SARS-CoV-2 public health emergency.  Safety protocols were in place, including screening questions prior to the visit, additional usage of staff PPE, and extensive cleaning of exam room while observing appropriate contact time as indicated for disinfecting solutions.    HPI She is here for a physical exam.   Had carotid doppler yesterday - stable compared to one year ago.   BP well controlled off hctz.    Under eyes are puffy x at least 3 weeks.  Zyrtec makes her too drowsy but it did help.        Medications and allergies reviewed with patient and updated if appropriate.  Patient Active Problem List   Diagnosis Date Noted   Sinusitis 02/21/2021   Allergic rhinitis 02/21/2021   Encounter for antineoplastic chemotherapy 10/23/2020   Encounter for antineoplastic immunotherapy 10/23/2020   Anemia 10/19/2020   Sleep difficulties 10/19/2020   S/P robot-assisted surgical procedure 09/24/2020   Aortic atherosclerosis ([place]) 09/12/2020   Lung mass 09/04/2020   Status post craniotomy 08/24/2020   Brain tumor ([place]) 08/24/2020   Hematochezia 08/19/2020   Acute blood loss anemia    Rectal bleeding 08/17/2020   Primary cancer of right upper lobe of lung ([place]) 08/07/2020   Brain metastasis ([place]) 08/06/2020   Memory changes 07/24/2020   Non-recurrent unilateral inguinal hernia without obstruction or gangrene 07/24/2020   Pain of right clavicle 09/12/2019   Nonspecific abnormal electrocardiogram (ECG) (EKG) 10/20/2018   Educated about COVID-19 virus infection 10/20/2018   Weight loss 07/17/2018   Depression- Dr Casimiro Needle 07/17/2018   Primary osteoarthritis of right knee 06/04/2018   Bilateral carotid artery stenosis 09/24/2017   Aortic valve sclerosis 09/24/2017   Vocal cord polyps 09/07/2017   Dysphagia 09/07/2017   Diabetes mellitus without  complication ([place]) 56/38/9373   GERD (gastroesophageal reflux disease) 02/27/2016   Anxiety - Dr Casimiro Needle 02/27/2016   S/P total knee replacement 02/13/2014   RBBB 02/08/2014   HTN (hypertension) 02/08/2014   PVD - bilateral 60-79% carotid strenosis 02/08/2014   Mixed hyperlipidemia 11/29/2013   Carpal tunnel syndrome, bilateral 11/23/2013   Murmur- mild -mod AR, mild MR 11/10/2013   Constipation 12/16/2012    Current Outpatient Medications on File Prior to Visit  Medication Sig Dispense Refill   acetaminophen (TYLENOL) 500 MG tablet Take 2 tablets (1,000 mg total) by mouth every 6 (six) hours. 30 tablet 0   ALPRAZolam (XANAX XR) 1 MG 24 hr tablet 1  qhs 30 tablet 4   ALPRAZolam (XANAX) 0.5 MG tablet 2  qam   2  @2 ;00 120 tablet 5   Ascorbic Acid (VITAMIN C) 1000 MG tablet Take 1,000 mg by mouth daily.     aspirin 81 MG tablet Take 1 tablet (81 mg total) by mouth daily. Restart on 08/31/20 30 tablet    carboxymethylcellulose (REFRESH PLUS) 0.5 % SOLN Place 1-2 drops into both eyes daily.     citalopram (CELEXA) 10 MG tablet Take 1 tablet (10 mg total) by mouth daily. 30 tablet 1   folic acid (FOLVITE) 1 MG tablet Take 1 tablet (1 mg total) by mouth daily. 30 tablet 4   loratadine (CLARITIN) 10 MG tablet Take 10 mg by mouth daily as needed for allergies.     mirtazapine (REMERON) 45 MG tablet Take 1 tablet (45 mg total) by mouth at bedtime.  30 tablet 3   omeprazole (PRILOSEC) 40 MG capsule TAKE 1 CAPSULE (40 MG TOTAL) BY MOUTH 2 (TWO) TIMES DAILY BEFORE A MEAL. 180 capsule 0   polyethylene glycol (MIRALAX / GLYCOLAX) 17 g packet Take 17 g by mouth 2 (two) times daily. 72 each 0   pravastatin (PRAVACHOL) 40 MG tablet TAKE 1 TABLET (40 MG TOTAL) BY MOUTH EVERY EVENING. 90 tablet 0   Probiotic Product (PROBIOTIC DAILY PO) Take 1 capsule by mouth daily.     prochlorperazine (COMPAZINE) 10 MG tablet Take 1 tablet (10 mg total) by mouth every 6 (six) hours as needed for nausea or vomiting. 30  tablet 0   senna-docusate (SENOKOT-S) 8.6-50 MG tablet Take 1 tablet by mouth 2 (two) times daily. (Patient taking differently: Take 1 tablet by mouth daily as needed for mild constipation.)     simethicone (MYLICON) 80 MG chewable tablet Chew 1 tablet (80 mg total) by mouth 4 (four) times daily as needed for flatulence (Bloating). 30 tablet 0   Wheat Dextrin (BENEFIBER) POWD Take 1 Scoop by mouth 2 (two) times daily as needed (constipation).     No current facility-administered medications on file prior to visit.    Past Medical History:  Diagnosis Date   Allergy    Anemia    Anxiety    Arthritis    Carpal tunnel syndrome    Constipation    senna C stool softeners help    Depression    Diverticulosis    Dyslipidemia    External hemorrhoids    GERD (gastroesophageal reflux disease)    Heart murmur    mild-moderate AR   Hiatal hernia    Hyperlipidemia    on meds    Hypertension    Internal hemorrhoids    lung ca with brain mets 07/2020   Osteoarthritis    Pre-diabetes    PVD (peripheral vascular disease) ([facility])    moderate carotid disease   RBBB    Smoker    Vocal cord polyps     Past Surgical History:  Procedure Laterality Date   ABDOMINAL HYSTERECTOMY  1540   APPLICATION OF CRANIAL NAVIGATION N/A 08/24/2020   Procedure: APPLICATION OF CRANIAL NAVIGATION;  Surgeon: Judith Part, MD;  Location: [facility];  Service: Neurosurgery;  Laterality: N/A;   COLONOSCOPY     COLONOSCOPY W/ POLYPECTOMY  2009   CRANIOTOMY Left 08/24/2020   Procedure: Left Craniotomy for Tumor Resection with Brainlab;  Surgeon: Judith Part, MD;  Location: [facility];  Service: Neurosurgery;  Laterality: Left;   HEMORRHOID SURGERY     INTERCOSTAL NERVE BLOCK Right 09/24/2020   Procedure: INTERCOSTAL NERVE BLOCK;  Surgeon: Melrose Nakayama, MD;  Location: [facility];  Service: Thoracic;  Laterality: Right;   JOINT REPLACEMENT     LYMPH NODE DISSECTION Right 09/24/2020   Procedure: LYMPH NODE  DISSECTION;  Surgeon: Melrose Nakayama, MD;  Location: [facility];  Service: Thoracic;  Laterality: Right;   POLYPECTOMY     right total knee arthroplasty     Dr. Emeterio Reeve 06-04-18   [place]  08/16/2009   TOTAL KNEE ARTHROPLASTY Left 02/13/2014   Procedure: TOTAL KNEE ARTHROPLASTY;  Surgeon: Alta Corning, MD;  Location: [facility];  Service: Orthopedics;  Laterality: Left;   TOTAL KNEE ARTHROPLASTY Right 06/04/2018   Procedure: RIGHT TOTAL KNEE ARTHROPLASTY;  Surgeon: Dorna Leitz, MD;  Location: [facility];  Service: Orthopedics;  Laterality: Right;  Adductor Block   TUBAL LIGATION  Social History   Socioeconomic History   Marital status: Married    Spouse name: Not on file   Number of children: 2   Years of education: Not on file   Highest education level: Not on file  Occupational History   Not on file  Tobacco Use   Smoking status: Former    Packs/day: 0.25    Years: 40.00    Pack years: 10.00    Types: Cigarettes    Quit date: 07/09/2020    Years since quitting: 0.6   Smokeless tobacco: Never   Tobacco comments:    PACK WILL LAST 3 days  Vaping Use   Vaping Use: Never used  Substance and Sexual Activity   Alcohol use: No   Drug use: No   Sexual activity: Not Currently  Other Topics Concern   Not on file  Social History Narrative   Not on file   Social Determinants of Health   Financial Resource Strain: Low Risk    Difficulty of Paying Living Expenses: Not hard at all  Food Insecurity: No Food Insecurity   Worried About Charity fundraiser in the Last Year: Never true   [place] in the Last Year: Never true  Transportation Needs: No Transportation Needs   Lack of Transportation (Medical): No   Lack of Transportation (Non-Medical): No  Physical Activity: Not on file  Stress: Not on file  Social Connections: Not on file    Family History  Problem Relation Age of Onset   Cancer Father    Prostate cancer Father    Diabetes Sister    Cancer Sister     Stroke Maternal Grandfather    Colitis Maternal Aunt    Breast cancer Maternal Aunt    Colon cancer Neg Hx    Colon polyps Neg Hx    Rectal cancer Neg Hx    Stomach cancer Neg Hx    Esophageal cancer Neg Hx     Review of Systems  Constitutional:  Negative for appetite change, chills and fever.  Eyes:  Negative for visual disturbance.  Respiratory:  Negative for cough, shortness of breath and wheezing.   Cardiovascular:  Negative for chest pain, palpitations and leg swelling.  Gastrointestinal:  Positive for constipation (controlled with miralax). Negative for abdominal pain, blood in stool, diarrhea, nausea and vomiting.       No gerd  Genitourinary:  Negative for dysuria and hematuria.  Musculoskeletal:  Negative for arthralgias and back pain.  Skin:  Negative for rash.  Neurological:  Negative for light-headedness and headaches.  Psychiatric/Behavioral:  Positive for dysphoric mood. The patient is nervous/anxious.       Objective:   Vitals:   03/20/21 1315  BP: 108/78  Pulse: 81  Temp: 98.8 F (37.1 C)  SpO2: 98%   Filed Weights   03/20/21 1315  Weight: 114 lb (51.7 kg)   Body mass index is 22.26 kg/m.  BP Readings from Last 3 Encounters:  03/20/21 108/78  02/27/21 111/63  02/21/21 106/67    Wt Readings from Last 3 Encounters:  03/20/21 114 lb (51.7 kg)  02/27/21 114 lb 8 oz (51.9 kg)  02/21/21 114 lb 12.8 oz (52.1 kg)     Physical Exam Constitutional: She appears well-developed and well-nourished. No distress.  HENT:  Head: Normocephalic and atraumatic.  Right Ear: External ear normal. Normal ear canal and TM Left Ear: External ear normal.  Normal ear canal and TM Mouth/Throat: Oropharynx is clear and moist.  Eyes: Conjunctivae and EOM are normal. Mild puffiness under eyes Neck: Neck supple. No tracheal deviation present. No thyromegaly present.  No carotid bruit  Cardiovascular: Normal rate, regular rhythm and normal heart sounds.   No murmur heard.   No edema. Pulmonary/Chest: Effort normal and breath sounds normal. No respiratory distress. She has no wheezes. She has no rales.  Breast: deferred   Abdominal: Soft. She exhibits no distension. There is no tenderness.  Lymphadenopathy: She has no cervical adenopathy.  Skin: Skin is warm and dry. She is not diaphoretic.  Psychiatric: She has a normal mood and affect. Her behavior is normal.   Diabetic Foot Exam - Simple   Simple Foot Form Diabetic Foot exam was performed with the following findings: Yes 03/20/2021  1:51 PM  Visual Inspection No deformities, no ulcerations, no other skin breakdown bilaterally: Yes Sensation Testing Intact to touch and monofilament testing bilaterally: Yes Pulse Check Posterior Tibialis and Dorsalis pulse intact bilaterally: Yes Comments      Lab Results  Component Value Date   WBC 4.1 02/27/2021   HGB 9.1 (L) 02/27/2021   HCT 28.9 (L) 02/27/2021   PLT 415 (H) 02/27/2021   GLUCOSE 122 (H) 02/27/2021   CHOL 132 03/14/2020   TRIG 104 03/14/2020   HDL 51 03/14/2020   LDLDIRECT 87.0 11/09/2014   LDLCALC 62 03/14/2020   ALT 35 02/27/2021   AST 44 (H) 02/27/2021   NA 137 02/27/2021   K 4.2 02/27/2021   CL 103 02/27/2021   CREATININE 0.76 02/27/2021   BUN 8 02/27/2021   CO2 24 02/27/2021   TSH 0.594 02/27/2021   INR 1.0 02/12/2021   HGBA1C 6.6 (H) 09/25/2020   MICROALBUR 0.6 03/14/2020         Assessment & Plan:   Physical exam: Screening blood work  ordered [facility] Weight  stable  Substance abuse  none   Reviewed recommended immunizations.   Health Maintenance  Topic Date Due   COVID-19 Vaccine (4 - Booster for Moderna series) 07/24/2020   URINE MICROALBUMIN  03/14/2021   INFLUENZA VACCINE  04/06/2021 (Originally 01/28/2021)   HEMOGLOBIN A1C  03/28/2021   OPHTHALMOLOGY EXAM  05/30/2021   DEXA SCAN  09/02/2021   FOOT EXAM  03/20/2022   MAMMOGRAM  05/30/2022   COLONOSCOPY (Pts 45-11yrs  Insurance coverage will need to be confirmed)  05/19/2024   TETANUS/TDAP  05/30/2024   Hepatitis C Screening  Completed   HPV VACCINES  Aged Out   Zoster Vaccines- Shingrix  Discontinued          See Problem List for Assessment and Plan of chronic medical problems.

## 2021-03-19 NOTE — Patient Instructions (Addendum)
Blood work was ordered.     Medications changes include :   for your allergies you can try zyrtec at night or take claritin or allegra - they are non-drowsy.  Stop the hydrochlorothiazide.     Your prescription(s) have been submitted to your pharmacy. Please take as directed and contact our office if you believe you are having problem(s) with the medication(s).   Please followup in 6 months    Health Maintenance, Female Adopting a healthy lifestyle and getting preventive care are important in promoting health and wellness. Ask your health care provider about: The right schedule for you to have regular tests and exams. Things you can do on your own to prevent diseases and keep yourself healthy. What should I know about diet, weight, and exercise? Eat a healthy diet  Eat a diet that includes plenty of vegetables, fruits, low-fat dairy products, and lean protein. Do not eat a lot of foods that are high in solid fats, added sugars, or sodium. Maintain a healthy weight Body mass index (BMI) is used to identify weight problems. It estimates body fat based on height and weight. Your health care provider can help determine your BMI and help you achieve or maintain a healthy weight. Get regular exercise Get regular exercise. This is one of the most important things you can do for your health. Most adults should: Exercise for at least 150 minutes each week. The exercise should increase your heart rate and make you sweat (moderate-intensity exercise). Do strengthening exercises at least twice a week. This is in addition to the moderate-intensity exercise. Spend less time sitting. Even light physical activity can be beneficial. Watch cholesterol and blood lipids Have your blood tested for lipids and cholesterol at 72 years of age, then have this test every 5 years. Have your cholesterol levels checked more often if: Your lipid or cholesterol levels are high. You are older than 72 years of  age. You are at high risk for heart disease. What should I know about cancer screening? Depending on your health history and family history, you may need to have cancer screening at various ages. This may include screening for: Breast cancer. Cervical cancer. Colorectal cancer. Skin cancer. Lung cancer. What should I know about heart disease, diabetes, and high blood pressure? Blood pressure and heart disease High blood pressure causes heart disease and increases the risk of stroke. This is more likely to develop in people who have high blood pressure readings, are of African descent, or are overweight. Have your blood pressure checked: Every 3-5 years if you are 56-84 years of age. Every year if you are 20 years old or older. Diabetes Have regular diabetes screenings. This checks your fasting blood sugar level. Have the screening done: Once every three years after age 48 if you are at a normal weight and have a low risk for diabetes. More often and at a younger age if you are overweight or have a high risk for diabetes. What should I know about preventing infection? Hepatitis B If you have a higher risk for hepatitis B, you should be screened for this virus. Talk with your health care provider to find out if you are at risk for hepatitis B infection. Hepatitis C Testing is recommended for: Everyone born from [birth year] through 1967. Anyone with known risk factors for hepatitis C. Sexually transmitted infections (STIs) Get screened for STIs, including gonorrhea and chlamydia, if: You are sexually active and are younger than 72 years of age. You are  older than 72 years of age and your health care provider tells you that you are at risk for this type of infection. Your sexual activity has changed since you were last screened, and you are at increased risk for chlamydia or gonorrhea. Ask your health care provider if you are at risk. Ask your health care provider about whether you are at high  risk for HIV. Your health care provider may recommend a prescription medicine to help prevent HIV infection. If you choose to take medicine to prevent HIV, you should first get tested for HIV. You should then be tested every 3 months for as long as you are taking the medicine. Pregnancy If you are about to stop having your period (premenopausal) and you may become pregnant, seek counseling before you get pregnant. Take 400 to 800 micrograms (mcg) of folic acid every day if you become pregnant. Ask for birth control (contraception) if you want to prevent pregnancy. Osteoporosis and menopause Osteoporosis is a disease in which the bones lose minerals and strength with aging. This can result in bone fractures. If you are 66 years old or older, or if you are at risk for osteoporosis and fractures, ask your health care provider if you should: Be screened for bone loss. Take a calcium or vitamin D supplement to lower your risk of fractures. Be given hormone replacement therapy (HRT) to treat symptoms of menopause. Follow these instructions at home: Lifestyle Do not use any products that contain nicotine or tobacco, such as cigarettes, e-cigarettes, and chewing tobacco. If you need help quitting, ask your health care provider. Do not use street drugs. Do not share needles. Ask your health care provider for help if you need support or information about quitting drugs. Alcohol use Do not drink alcohol if: Your health care provider tells you not to drink. You are pregnant, may be pregnant, or are planning to become pregnant. If you drink alcohol: Limit how much you use to 0-1 drink a day. Limit intake if you are breastfeeding. Be aware of how much alcohol is in your drink. In the [place] one drink equals one 12 oz bottle of beer (355 mL), one 5 oz glass of wine (148 mL), or one 1 oz glass of hard liquor (44 mL). General instructions Schedule regular health, dental, and eye exams. Stay current with  your vaccines. Tell your health care provider if: You often feel depressed. You have ever been abused or do not feel safe at home. Summary Adopting a healthy lifestyle and getting preventive care are important in promoting health and wellness. Follow your health care provider's instructions about healthy diet, exercising, and getting tested or screened for diseases. Follow your health care provider's instructions on monitoring your cholesterol and blood pressure. This information is not intended to replace advice given to you by your health care provider. Make sure you discuss any questions you have with your health care provider. Document Revised: 08/24/2020 Document Reviewed: 06/09/2018 Elsevier Patient Education  2022 [employer].

## 2021-03-20 ENCOUNTER — Ambulatory Visit (INDEPENDENT_AMBULATORY_CARE_PROVIDER_SITE_OTHER): Payer: Medicare HMO | Admitting: Internal Medicine

## 2021-03-20 ENCOUNTER — Encounter: Payer: Medicare HMO | Admitting: Internal Medicine

## 2021-03-20 ENCOUNTER — Encounter: Payer: Self-pay | Admitting: Internal Medicine

## 2021-03-20 VITALS — BP 108/78 | HR 81 | Temp 98.8°F | Ht 60.0 in | Wt 114.0 lb

## 2021-03-20 DIAGNOSIS — I10 Essential (primary) hypertension: Secondary | ICD-10-CM | POA: Diagnosis not present

## 2021-03-20 DIAGNOSIS — E782 Mixed hyperlipidemia: Secondary | ICD-10-CM | POA: Diagnosis not present

## 2021-03-20 DIAGNOSIS — F419 Anxiety disorder, unspecified: Secondary | ICD-10-CM

## 2021-03-20 DIAGNOSIS — F3289 Other specified depressive episodes: Secondary | ICD-10-CM | POA: Diagnosis not present

## 2021-03-20 DIAGNOSIS — J309 Allergic rhinitis, unspecified: Secondary | ICD-10-CM

## 2021-03-20 DIAGNOSIS — I6523 Occlusion and stenosis of bilateral carotid arteries: Secondary | ICD-10-CM

## 2021-03-20 DIAGNOSIS — E119 Type 2 diabetes mellitus without complications: Secondary | ICD-10-CM | POA: Diagnosis not present

## 2021-03-20 DIAGNOSIS — K219 Gastro-esophageal reflux disease without esophagitis: Secondary | ICD-10-CM | POA: Diagnosis not present

## 2021-03-20 DIAGNOSIS — Z0000 Encounter for general adult medical examination without abnormal findings: Secondary | ICD-10-CM

## 2021-03-20 LAB — CBC WITH DIFFERENTIAL/PLATELET
Basophils Absolute: 0.1 10*3/uL (ref 0.0–0.1)
Basophils Relative: 1.1 % (ref 0.0–3.0)
Eosinophils Absolute: 0.1 10*3/uL (ref 0.0–0.7)
Eosinophils Relative: 1.1 % (ref 0.0–5.0)
HCT: 31.1 % — ABNORMAL LOW (ref 36.0–46.0)
Hemoglobin: 9.8 g/dL — ABNORMAL LOW (ref 12.0–15.0)
Lymphocytes Relative: 36.5 % (ref 12.0–46.0)
Lymphs Abs: 1.7 10*3/uL (ref 0.7–4.0)
MCHC: 31.5 g/dL (ref 30.0–36.0)
MCV: 85.4 fl (ref 78.0–100.0)
Monocytes Absolute: 0.5 10*3/uL (ref 0.1–1.0)
Monocytes Relative: 10.7 % (ref 3.0–12.0)
Neutro Abs: 2.4 10*3/uL (ref 1.4–7.7)
Neutrophils Relative %: 50.6 % (ref 43.0–77.0)
Platelets: 451 10*3/uL — ABNORMAL HIGH (ref 150.0–400.0)
RBC: 3.64 Mil/uL — ABNORMAL LOW (ref 3.87–5.11)
RDW: 15.9 % — ABNORMAL HIGH (ref 11.5–15.5)
WBC: 4.7 10*3/uL (ref 4.0–10.5)

## 2021-03-20 LAB — COMPREHENSIVE METABOLIC PANEL
ALT: 12 U/L (ref 0–35)
AST: 23 U/L (ref 0–37)
Albumin: 4 g/dL (ref 3.5–5.2)
Alkaline Phosphatase: 63 U/L (ref 39–117)
BUN: 6 mg/dL (ref 6–23)
CO2: 30 mEq/L (ref 19–32)
Calcium: 10 mg/dL (ref 8.4–10.5)
Chloride: 102 mEq/L (ref 96–112)
Creatinine, Ser: 0.71 mg/dL (ref 0.40–1.20)
GFR: 84.83 mL/min (ref >60.00)
Glucose, Bld: 78 mg/dL (ref 70–99)
Potassium: 3.9 mEq/L (ref 3.5–5.1)
Sodium: 139 mEq/L (ref 135–145)
Total Bilirubin: 0.2 mg/dL (ref 0.2–1.2)
Total Protein: 7.5 g/dL (ref 6.0–8.3)

## 2021-03-20 LAB — LIPID PANEL
Cholesterol: 171 mg/dL (ref 0–200)
HDL: 46.9 mg/dL (ref >39.00)
NonHDL: 124.14
Total CHOL/HDL Ratio: 4
Triglycerides: 286 mg/dL — ABNORMAL HIGH (ref 0.0–149.0)
VLDL: 57.2 mg/dL — ABNORMAL HIGH (ref 0.0–40.0)

## 2021-03-20 LAB — MICROALBUMIN / CREATININE URINE RATIO
Creatinine,U: 87.2 mg/dL
Microalb Creat Ratio: 0.8 mg/g (ref 0.0–30.0)
Microalb, Ur: <0.7 mg/dL (ref 0.0–1.9)

## 2021-03-20 LAB — HEMOGLOBIN A1C: Hgb A1c MFr Bld: 6 % (ref 4.6–6.5)

## 2021-03-20 NOTE — Assessment & Plan Note (Signed)
Chronic BP well controlled off hctz Stop hydrochlorothiazide Continue to monitor BP at home cmp

## 2021-03-20 NOTE — Assessment & Plan Note (Signed)
Chronic Check lipid panel  Continue pravastatin 40mg   Regular exercise and healthy diet encouraged

## 2021-03-20 NOTE — Assessment & Plan Note (Signed)
Chronic GERD controlled Continue omeprazole 40 mg bid

## 2021-03-20 NOTE — Assessment & Plan Note (Signed)
Acute on Chronic Increased eye puffiness zyrtec was causing too much drowsiness - can take at night or try Claritin or allegra

## 2021-03-20 NOTE — Assessment & Plan Note (Signed)
Chronic Lab Results  Component Value Date   HGBA1C 6.6 (H) 09/25/2020   Well controlled with diet Continue exercise, diabetic diet a1c today, urine microalbumin

## 2021-03-20 NOTE — Assessment & Plan Note (Signed)
Chronic Fairly controlled - Still with anxiety - some anxiety about being alone Management per Dr Casimiro Needle

## 2021-03-20 NOTE — Assessment & Plan Note (Signed)
Chronic Controlled Management per Dr Casimiro Needle

## 2021-03-21 ENCOUNTER — Inpatient Hospital Stay: Payer: Medicare HMO

## 2021-03-21 ENCOUNTER — Other Ambulatory Visit: Payer: Self-pay

## 2021-03-21 ENCOUNTER — Encounter: Payer: Self-pay | Admitting: Internal Medicine

## 2021-03-21 ENCOUNTER — Inpatient Hospital Stay: Payer: Medicare HMO | Attending: Internal Medicine | Admitting: Physician Assistant

## 2021-03-21 VITALS — BP 146/72 | HR 72 | Temp 98.7°F | Resp 18 | Wt 114.8 lb

## 2021-03-21 DIAGNOSIS — C3411 Malignant neoplasm of upper lobe, right bronchus or lung: Secondary | ICD-10-CM

## 2021-03-21 DIAGNOSIS — C7931 Secondary malignant neoplasm of brain: Secondary | ICD-10-CM | POA: Insufficient documentation

## 2021-03-21 DIAGNOSIS — F32A Depression, unspecified: Secondary | ICD-10-CM | POA: Insufficient documentation

## 2021-03-21 DIAGNOSIS — I2510 Atherosclerotic heart disease of native coronary artery without angina pectoris: Secondary | ICD-10-CM | POA: Insufficient documentation

## 2021-03-21 DIAGNOSIS — H538 Other visual disturbances: Secondary | ICD-10-CM | POA: Insufficient documentation

## 2021-03-21 DIAGNOSIS — F419 Anxiety disorder, unspecified: Secondary | ICD-10-CM | POA: Insufficient documentation

## 2021-03-21 DIAGNOSIS — I700 Atherosclerosis of aorta: Secondary | ICD-10-CM | POA: Insufficient documentation

## 2021-03-21 DIAGNOSIS — Z5112 Encounter for antineoplastic immunotherapy: Secondary | ICD-10-CM | POA: Diagnosis not present

## 2021-03-21 DIAGNOSIS — J90 Pleural effusion, not elsewhere classified: Secondary | ICD-10-CM | POA: Diagnosis not present

## 2021-03-21 DIAGNOSIS — Z79899 Other long term (current) drug therapy: Secondary | ICD-10-CM | POA: Diagnosis not present

## 2021-03-21 DIAGNOSIS — Z5111 Encounter for antineoplastic chemotherapy: Secondary | ICD-10-CM | POA: Diagnosis not present

## 2021-03-21 DIAGNOSIS — K219 Gastro-esophageal reflux disease without esophagitis: Secondary | ICD-10-CM | POA: Diagnosis not present

## 2021-03-21 DIAGNOSIS — M1990 Unspecified osteoarthritis, unspecified site: Secondary | ICD-10-CM | POA: Insufficient documentation

## 2021-03-21 DIAGNOSIS — I10 Essential (primary) hypertension: Secondary | ICD-10-CM | POA: Insufficient documentation

## 2021-03-21 DIAGNOSIS — Z8719 Personal history of other diseases of the digestive system: Secondary | ICD-10-CM | POA: Diagnosis not present

## 2021-03-21 DIAGNOSIS — E785 Hyperlipidemia, unspecified: Secondary | ICD-10-CM | POA: Diagnosis not present

## 2021-03-21 DIAGNOSIS — Z902 Acquired absence of lung [part of]: Secondary | ICD-10-CM | POA: Insufficient documentation

## 2021-03-21 DIAGNOSIS — Z888 Allergy status to other drugs, medicaments and biological substances status: Secondary | ICD-10-CM | POA: Diagnosis not present

## 2021-03-21 DIAGNOSIS — R5383 Other fatigue: Secondary | ICD-10-CM | POA: Diagnosis not present

## 2021-03-21 DIAGNOSIS — Z87891 Personal history of nicotine dependence: Secondary | ICD-10-CM | POA: Diagnosis not present

## 2021-03-21 LAB — CMP (CANCER CENTER ONLY)
ALT: 11 U/L (ref 0–44)
AST: 22 U/L (ref 15–41)
Albumin: 3.7 g/dL (ref 3.5–5.0)
Alkaline Phosphatase: 74 U/L (ref 38–126)
Anion gap: 9 (ref 5–15)
BUN: 6 mg/dL — ABNORMAL LOW (ref 8–23)
CO2: 27 mmol/L (ref 22–32)
Calcium: 10 mg/dL (ref 8.9–10.3)
Chloride: 106 mmol/L (ref 98–111)
Creatinine: 0.75 mg/dL (ref 0.44–1.00)
GFR, Estimated: >60 mL/min (ref >60)
Glucose, Bld: 94 mg/dL (ref 70–99)
Potassium: 3.9 mmol/L (ref 3.5–5.1)
Sodium: 142 mmol/L (ref 135–145)
Total Bilirubin: 0.3 mg/dL (ref 0.3–1.2)
Total Protein: 7.4 g/dL (ref 6.5–8.1)

## 2021-03-21 LAB — CBC WITH DIFFERENTIAL (CANCER CENTER ONLY)
Abs Immature Granulocytes: 0.01 10*3/uL (ref 0.00–0.07)
Basophils Absolute: 0.1 10*3/uL (ref 0.0–0.1)
Basophils Relative: 1 %
Eosinophils Absolute: 0.1 10*3/uL (ref 0.0–0.5)
Eosinophils Relative: 1 %
HCT: 31.5 % — ABNORMAL LOW (ref 36.0–46.0)
Hemoglobin: 9.6 g/dL — ABNORMAL LOW (ref 12.0–15.0)
Immature Granulocytes: 0 %
Lymphocytes Relative: 32 %
Lymphs Abs: 1.4 10*3/uL (ref 0.7–4.0)
MCH: 26.9 pg (ref 26.0–34.0)
MCHC: 30.5 g/dL (ref 30.0–36.0)
MCV: 88.2 fL (ref 80.0–100.0)
Monocytes Absolute: 0.5 10*3/uL (ref 0.1–1.0)
Monocytes Relative: 11 %
Neutro Abs: 2.3 10*3/uL (ref 1.7–7.7)
Neutrophils Relative %: 55 %
Platelet Count: 384 10*3/uL (ref 150–400)
RBC: 3.57 MIL/uL — ABNORMAL LOW (ref 3.87–5.11)
RDW: 14.4 % (ref 11.5–15.5)
WBC Count: 4.3 10*3/uL (ref 4.0–10.5)
nRBC: 0 % (ref 0.0–0.2)

## 2021-03-21 LAB — LDL CHOLESTEROL, DIRECT: Direct LDL: 73 mg/dL

## 2021-03-21 LAB — TSH: TSH: 0.744 u[IU]/mL (ref 0.308–3.960)

## 2021-03-21 MED ORDER — SODIUM CHLORIDE 0.9 % IV SOLN: Freq: Once | INTRAVENOUS | Status: AC

## 2021-03-21 MED ORDER — SODIUM CHLORIDE 0.9 % IV SOLN
500.0000 mg/m2 | Freq: Once | INTRAVENOUS | Status: AC
  Administered 2021-03-21: 700 mg via INTRAVENOUS
  Filled 2021-03-21: qty 20

## 2021-03-21 MED ORDER — PROCHLORPERAZINE MALEATE 10 MG PO TABS
ORAL_TABLET | ORAL | Status: AC
  Administered 2021-03-21: 10 mg via ORAL
  Filled 2021-03-21: qty 1

## 2021-03-21 MED ORDER — SODIUM CHLORIDE 0.9 % IV SOLN
200.0000 mg | Freq: Once | INTRAVENOUS | Status: AC
  Administered 2021-03-21: 200 mg via INTRAVENOUS
  Filled 2021-03-21: qty 8

## 2021-03-21 MED ORDER — PROCHLORPERAZINE MALEATE 10 MG PO TABS: 10.0000 mg | ORAL_TABLET | Freq: Once | ORAL | Status: AC

## 2021-03-21 NOTE — Patient Instructions (Signed)
[facility]  Discharge Instructions: Thank you for choosing [facility] to provide your oncology and hematology care.   If you have a lab appointment with the Cancer Center, please go directly to the Cancer Center and check in at the registration area.   Wear comfortable clothing and clothing appropriate for easy access to any Portacath or PICC line.   We strive to give you quality time with your provider. You may need to reschedule your appointment if you arrive late (15 or more minutes).  Arriving late affects you and other patients whose appointments are after yours.  Also, if you miss three or more appointments without notifying the office, you may be dismissed from the clinic at the provider's discretion.      For prescription refill requests, have your pharmacy contact our office and allow 72 hours for refills to be completed.    Today you received the following chemotherapy and/or immunotherapy agents Pembrolizumab (Keytruda) and Pemetrexed (Alimta)      To help prevent nausea and vomiting after your treatment, we encourage you to take your nausea medication as directed.  BELOW ARE SYMPTOMS THAT SHOULD BE REPORTED IMMEDIATELY: *FEVER GREATER THAN 100.4 F (38 C) OR HIGHER *CHILLS OR SWEATING *NAUSEA AND VOMITING THAT IS NOT CONTROLLED WITH YOUR NAUSEA MEDICATION *UNUSUAL SHORTNESS OF BREATH *UNUSUAL BRUISING OR BLEEDING *URINARY PROBLEMS (pain or burning when urinating, or frequent urination) *BOWEL PROBLEMS (unusual diarrhea, constipation, pain near the anus) TENDERNESS IN MOUTH AND THROAT WITH OR WITHOUT PRESENCE OF ULCERS (sore throat, sores in mouth, or a toothache) UNUSUAL RASH, SWELLING OR PAIN  UNUSUAL VAGINAL DISCHARGE OR ITCHING   Items with * indicate a potential emergency and should be followed up as soon as possible or go to the Emergency Department if any problems should occur.  Please show the CHEMOTHERAPY ALERT CARD or  IMMUNOTHERAPY ALERT CARD at check-in to the Emergency Department and triage nurse.  Should you have questions after your visit or need to cancel or reschedule your appointment, please contact [facility]  Dept: [phone]  and follow the prompts.  Office hours are 8:00 a.m. to 4:30 p.m. Monday - Friday. Please note that voicemails left after 4:00 p.m. may not be returned until the following business day.  We are closed weekends and major holidays. You have access to a nurse at all times for urgent questions. Please call the main number to the clinic Dept: [phone] and follow the prompts.   For any non-urgent questions, you may also contact your provider using MyChart. We now offer e-Visits for anyone 18 and older to request care online for non-urgent symptoms. For details visit [email or web address].   Also download the MyChart app! Go to the app store, search "MyChart", open the app, select , and log in with your MyChart username and password.  Due to Covid, a mask is required upon entering the hospital/clinic. If you do not have a mask, one will be given to you upon arrival. For doctor visits, patients may have 1 support person aged 18 or older with them. For treatment visits, patients cannot have anyone with them due to current Covid guidelines and our immunocompromised population.   

## 2021-03-22 ENCOUNTER — Encounter: Payer: Self-pay | Admitting: Internal Medicine

## 2021-03-26 ENCOUNTER — Other Ambulatory Visit: Payer: Self-pay

## 2021-03-26 ENCOUNTER — Telehealth (HOSPITAL_BASED_OUTPATIENT_CLINIC_OR_DEPARTMENT_OTHER): Payer: Medicare HMO | Admitting: Psychiatry

## 2021-03-26 VITALS — BP 117/69 | HR 90 | Temp 97.5°F | Resp 18 | Ht 60.0 in | Wt 114.6 lb

## 2021-03-26 DIAGNOSIS — F324 Major depressive disorder, single episode, in partial remission: Secondary | ICD-10-CM

## 2021-03-26 MED ORDER — MIRTAZAPINE 45 MG PO TABS: 45.0000 mg | ORAL_TABLET | Freq: Every day | ORAL | 3 refills | Status: DC

## 2021-03-26 MED ORDER — ALPRAZOLAM 0.5 MG PO TABS: ORAL_TABLET | ORAL | 5 refills | Status: DC

## 2021-03-26 MED ORDER — CITALOPRAM HYDROBROMIDE 10 MG PO TABS: 10.0000 mg | ORAL_TABLET | Freq: Every day | ORAL | 1 refills | Status: DC

## 2021-03-26 MED ORDER — ALPRAZOLAM ER 1 MG PO TB24: ORAL_TABLET | ORAL | 4 refills | Status: DC

## 2021-03-26 NOTE — Progress Notes (Signed)
Psychiatric Initial Adult Assessment   Patient Identification: Hannah [name] MRN:  [id number] Date of Evaluation:  03/26/2021 Referral Source: From the community Chief Complaint:   Visit Diagnosis: No diagnosis found.  History of Present Illness:    Today the patient is seen with her daughter, [name].  Today the patient is doing well.  She did not feel so great yesterday but is just isolated days where she feels depressed and anxious.  It is a minority of time.  She does not feel persistent daily depression and she is not paralyzed by it.  Overall her mood is improved.  She says it is even better since being placed on 10 mg of Celexa.  The patient is sleeping relatively well.  She was having problems with early morning anxiety.  This seems to be better since she is on the long-acting Xanax 1 mg XR every night.  She also continues taking Xanax 0.5 mg 2 in the morning and 2 midday.  Overall her anxiety and depression is improved.  The patient has a history of lung cancer.  She has metastatic disease to her frontal lobe and had surgery to remove the metastatic tumor.  She is actually doing very well.  Now she is on her regime where she gets chemotherapy every 3 weeks.  The patient is healthy overall doing pretty well.  Her weight is stable.  She is active and taking off her hypertensive medicines.  Again she is sleeping and eating well and has reasonably good energy.  She is more active these days.  She is reading now driving.  She goes out with her friends eating all the time.  She is able to think and concentrate well.  Overall her functioning has improved.  She drinks no alcohol and uses no drugs.  She has never been psychotic.  She is doing well.  She is not in any type of therapy at this time.  Associated Signs/Symptoms: Depression Symptoms:  depressed mood, (Hypo) Manic Symptoms:   Anxiety Symptoms:   Psychotic Symptoms:   PTSD Symptoms:   Past Psychiatric History:   Previous Psychotropic  Medications:   Substance Abuse History in the last 12 months:    Consequences of Substance Abuse:   Past Medical History:  Past Medical History:  Diagnosis Date   Allergy    Anemia    Anxiety    Arthritis    Carpal tunnel syndrome    Constipation    senna C stool softeners help    Depression    Diverticulosis    Dyslipidemia    External hemorrhoids    GERD (gastroesophageal reflux disease)    Heart murmur    mild-moderate AR   Hiatal hernia    Hyperlipidemia    on meds    Hypertension    Internal hemorrhoids    lung ca with brain mets 07/2020   Osteoarthritis    Pre-diabetes    PVD (peripheral vascular disease) (HCC)    moderate carotid disease   RBBB    Smoker    Vocal cord polyps     Past Surgical History:  Procedure Laterality Date   ABDOMINAL HYSTERECTOMY  9417   APPLICATION OF CRANIAL NAVIGATION N/A 08/24/2020   Procedure: APPLICATION OF CRANIAL NAVIGATION;  Surgeon: Judith Part, MD;  Location: [facility];  Service: Neurosurgery;  Laterality: N/A;   COLONOSCOPY     COLONOSCOPY W/ POLYPECTOMY  2009   CRANIOTOMY Left 08/24/2020   Procedure: Left Craniotomy for Tumor Resection with  Brainlab;  Surgeon: Judith Part, MD;  Location: [facility];  Service: Neurosurgery;  Laterality: Left;   HEMORRHOID SURGERY     INTERCOSTAL NERVE BLOCK Right 09/24/2020   Procedure: INTERCOSTAL NERVE BLOCK;  Surgeon: Melrose Nakayama, MD;  Location: [facility];  Service: Thoracic;  Laterality: Right;   JOINT REPLACEMENT     LYMPH NODE DISSECTION Right 09/24/2020   Procedure: LYMPH NODE DISSECTION;  Surgeon: Melrose Nakayama, MD;  Location: [facility];  Service: Thoracic;  Laterality: Right;   POLYPECTOMY     right total knee arthroplasty     Dr. Emeterio Reeve 06-04-18   [place]  08/16/2009   TOTAL KNEE ARTHROPLASTY Left 02/13/2014   Procedure: TOTAL KNEE ARTHROPLASTY;  Surgeon: Alta Corning, MD;  Location: [facility];  Service: Orthopedics;  Laterality: Left;   TOTAL KNEE  ARTHROPLASTY Right 06/04/2018   Procedure: RIGHT TOTAL KNEE ARTHROPLASTY;  Surgeon: Dorna Leitz, MD;  Location: [facility];  Service: Orthopedics;  Laterality: Right;  Adductor Block   TUBAL LIGATION      Family Psychiatric History:   Family History:  Family History  Problem Relation Age of Onset   Cancer Father    Prostate cancer Father    Diabetes Sister    Cancer Sister    Stroke Maternal Grandfather    Colitis Maternal Aunt    Breast cancer Maternal Aunt    Colon cancer Neg Hx    Colon polyps Neg Hx    Rectal cancer Neg Hx    Stomach cancer Neg Hx    Esophageal cancer Neg Hx     Social History:   Social History   Socioeconomic History   Marital status: Married    Spouse name: Not on file   Number of children: 2   Years of education: Not on file   Highest education level: Not on file  Occupational History   Not on file  Tobacco Use   Smoking status: Former    Packs/day: 0.25    Years: 40.00    Pack years: 10.00    Types: Cigarettes    Quit date: 07/09/2020    Years since quitting: 0.7   Smokeless tobacco: Never   Tobacco comments:    PACK WILL LAST 3 days  Vaping Use   Vaping Use: Never used  Substance and Sexual Activity   Alcohol use: No   Drug use: No   Sexual activity: Not Currently  Other Topics Concern   Not on file  Social History Narrative   Not on file   Social Determinants of Health   Financial Resource Strain: Low Risk    Difficulty of Paying Living Expenses: Not hard at all  Food Insecurity: No Food Insecurity   Worried About Charity fundraiser in the Last Year: Never true   Ran Out of Food in the Last Year: Never true  Transportation Needs: No Transportation Needs   Lack of Transportation (Medical): No   Lack of Transportation (Non-Medical): No  Physical Activity: Not on file  Stress: Not on file  Social Connections: Not on file    Additional Social History:   Allergies:   Allergies  Allergen Reactions   Amlodipine Swelling and  Rash    Rash, swelling   Chantix [Varenicline Tartrate] Shortness Of Breath, Swelling and Other (See Comments)    Tongue swell,sob   Clarithromycin Rash   Lisinopril Hives   Simvastatin Hives   Wellbutrin [Bupropion Hcl] Hives   Lipitor [Atorvastatin]  Dizziness per patient   Sertraline     Makes her feel like she is going to kill someone    Metabolic Disorder Labs: Lab Results  Component Value Date   HGBA1C 6.0 03/20/2021   MPG 142.72 09/25/2020   MPG 140 03/14/2020   No results found for: PROLACTIN Lab Results  Component Value Date   CHOL 171 03/20/2021   TRIG 286.0 (H) 03/20/2021   HDL 46.90 03/20/2021   CHOLHDL 4 03/20/2021   VLDL 57.2 (H) 03/20/2021   LDLCALC 62 03/14/2020   LDLCALC 63 09/12/2019   Lab Results  Component Value Date   TSH 0.744 03/21/2021    Therapeutic Level Labs: No results found for: LITHIUM No results found for: CBMZ No results found for: VALPROATE  Current Medications: Current Outpatient Medications  Medication Sig Dispense Refill   acetaminophen (TYLENOL) 500 MG tablet Take 2 tablets (1,000 mg total) by mouth every 6 (six) hours. 30 tablet 0   ALPRAZolam (XANAX XR) 1 MG 24 hr tablet 1  qhs 30 tablet 4   ALPRAZolam (XANAX) 0.5 MG tablet 2  qam   2  @2 ;00 120 tablet 5   Ascorbic Acid (VITAMIN C) 1000 MG tablet Take 1,000 mg by mouth daily.     aspirin 81 MG tablet Take 1 tablet (81 mg total) by mouth daily. Restart on 08/31/20 30 tablet    carboxymethylcellulose (REFRESH PLUS) 0.5 % SOLN Place 1-2 drops into both eyes daily.     citalopram (CELEXA) 10 MG tablet Take 1 tablet (10 mg total) by mouth daily. 30 tablet 1   folic acid (FOLVITE) 1 MG tablet Take 1 tablet (1 mg total) by mouth daily. 30 tablet 4   loratadine (CLARITIN) 10 MG tablet Take 10 mg by mouth daily as needed for allergies.     mirtazapine (REMERON) 45 MG tablet Take 1 tablet (45 mg total) by mouth at bedtime. 30 tablet 3   omeprazole (PRILOSEC) 40 MG capsule TAKE 1  CAPSULE (40 MG TOTAL) BY MOUTH 2 (TWO) TIMES DAILY BEFORE A MEAL. 180 capsule 0   polyethylene glycol (MIRALAX / GLYCOLAX) 17 g packet Take 17 g by mouth 2 (two) times daily. 72 each 0   pravastatin (PRAVACHOL) 40 MG tablet TAKE 1 TABLET (40 MG TOTAL) BY MOUTH EVERY EVENING. 90 tablet 0   Probiotic Product (PROBIOTIC DAILY PO) Take 1 capsule by mouth daily.     prochlorperazine (COMPAZINE) 10 MG tablet Take 1 tablet (10 mg total) by mouth every 6 (six) hours as needed for nausea or vomiting. 30 tablet 0   senna-docusate (SENOKOT-S) 8.6-50 MG tablet Take 1 tablet by mouth 2 (two) times daily. (Patient taking differently: Take 1 tablet by mouth daily as needed for mild constipation.)     simethicone (MYLICON) 80 MG chewable tablet Chew 1 tablet (80 mg total) by mouth 4 (four) times daily as needed for flatulence (Bloating). 30 tablet 0   Wheat Dextrin (BENEFIBER) POWD Take 1 Scoop by mouth 2 (two) times daily as needed (constipation).     No current facility-administered medications for this visit.    Musculoskeletal: Strength & Muscle Tone: within normal limits Gait & Station: normal Patient leans: N/A  Psychiatric Specialty Exam: Review of Systems  Blood pressure 117/69, pulse 90, temperature (!) 97.5 F (36.4 C), temperature source Temporal, resp. rate 18, height 5' (1.524 m), weight 114 lb 9.6 oz (52 kg), SpO2 98 %.Body mass index is 22.38 kg/m.  General Appearance: Casual  Eye Contact:  Good  Speech:  Clear and Coherent  Volume:  Normal  Mood:  Depressed  Affect:  Appropriate  Thought Process:  Goal Directed  Orientation:  NA  Thought Content:  Logical  Suicidal Thoughts:  No  Homicidal Thoughts:  No  Memory:  NA  Judgement:  Good  Insight:  Good  Psychomotor Activity:  NA and Normal  Concentration:    Recall:  Good  Fund of Knowledge:Good  Language: Good  Akathisia:  No  Handed:  Right  AIMS (if indicated):  not done  Assets:  Desire for Improvement  ADL's:  Intact   Cognition: WNL  Sleep:  Good   Screenings: GAD-7    Flowsheet Row Office Visit from 09/12/2019 in [place] at [facility]  Total GAD-7 Score 7      [place] from 12/21/2017 in [facility]  Total Score (max 30 points ) 30      PHQ2-9    Flowsheet Row Chronic Care Management from 03/05/2021 in [place] at [facility] Management from 12/27/2020 in [place] at [place] Management from 10/10/2020 in [facility] at [facility] from 01/11/2020 in [facility] at [facility] from 09/12/2019 in [place] at [facility]  PHQ-2 Total Score 1 0 1 0 2  PHQ-9 Total Score -- -- -- -- 4      Flowsheet Row ED from 02/12/2021 in [facility] DEPT Admission (Discharged) from 09/24/2020 in [facility] from 09/20/2020 in [facility] PREADMISSION TESTING  C-SSRS RISK CATEGORY No Risk No Risk No Risk       Assessment and Plan:  Major depression in remission.  At this time the patient will continue taking Remeron 45 mg together with Celexa 10 mg.  Her second problem is an adjustment disorder with an anxious mood state.  She will continue taking Xanax 0.5 mg 2 in the morning to midday and Xanax 1 mg XR at night.  Today they share with me that she sometimes in the middle of the day seems to be somewhat lethargic.  She is not sure if it is related to the chemotherapy with Xanax.  Today we will go ahead and say that it is okay to take 2 Xanax in the morning and to take 1 midday and continue the long-acting Xanax at night.  The other 1 that she does not take and the level that she can take on a as needed basis.  Overall she will be on a maximum of 3 mg.  Overall she is not oversedated.  She has had no falls.  She is functioning well.  Her mood state is  improved.   Jerral Ralph, MD 9/27/20223:28 PM

## 2021-03-29 DIAGNOSIS — C3411 Malignant neoplasm of upper lobe, right bronchus or lung: Secondary | ICD-10-CM | POA: Diagnosis not present

## 2021-03-29 DIAGNOSIS — I10 Essential (primary) hypertension: Secondary | ICD-10-CM | POA: Diagnosis not present

## 2021-04-03 ENCOUNTER — Other Ambulatory Visit: Payer: Self-pay | Admitting: Internal Medicine

## 2021-04-05 NOTE — Progress Notes (Signed)
[facility] OFFICE PROGRESS NOTE  [name], MD [place] [id number]  DIAGNOSIS: Stage IV non-small cell lung cancer (T3, N0, M1C) adenocarcinoma.  The patient presented with a right upper lobe lung mass and solitary brain metastasis.  The patient was diagnosed in February 2022.   Biomarker Findings Microsatellite status - MS-Stable Tumor Mutational Burden - 8 Muts/Mb Genomic Findings For a complete list of the genes assayed, please refer to the Appendix. KRAS G12V KEAP1 S224F TP53 P151T 7 Disease relevant genes with no reportable alterations: ALK, BRAF, EGFR, ERBB2, MET, RET, ROS1   PDL1 Expression: 90%   PRIOR THERAPY: 1) SRS to the metastatic brain lesion on 08/23/2020 under the care of Dr. Tammi Klippel and craniotomy under the care of Dr. Zada Finders on 08/24/2020. 2) S/p robotic assisted right upper lobectomy with en bloc wedge resection of the right middle lobe and lymph node dissection under the care of Dr. Roxan Hockey on September 24, 2020 3) SRS to the two new subcentimeter metastases under the care of Dr. Tammi Klippel on 11/29/20.   CURRENT THERAPY: Systemic chemotherapy with carboplatin for AUC of 5, Alimta 500 Mg/M2 and Keytruda 200 mg IV every 3 weeks.  First dose Nov 08, 2020. Status post 7 cycles.  Starting from cycle #5, the patient started maintenance Alimta and Keytruda.  INTERVAL HISTORY: Hannah [name] 70 y.o. female returns to the clinic today for a follow-up visit. The patient is feeling fairly well today without any concerning complaints.The patient tolerated her last cycle of treatment fairly well without any concerning adverse side effects.  She denies any recent chills, fevers, or night sweats. She takes remeron for her appetite and depression. Her weight is relatively stable. She denies any nausea, vomiting, diarrhea, or constipation. If she has constipation, she will take miralax. He denies any rashes or skin changes.  She denies any  headaches. She denies any chest pain, shortness of breath, cough, or hemoptysis.  She is here today for evaluation before starting cycle #8.  MEDICAL HISTORY: Past Medical History:  Diagnosis Date   Allergy    Anemia    Anxiety    Arthritis    Carpal tunnel syndrome    Constipation    senna C stool softeners help    Depression    Diverticulosis    Dyslipidemia    External hemorrhoids    GERD (gastroesophageal reflux disease)    Heart murmur    mild-moderate AR   Hiatal hernia    Hyperlipidemia    on meds    Hypertension    Internal hemorrhoids    lung ca with brain mets 07/2020   Osteoarthritis    Pre-diabetes    PVD (peripheral vascular disease) (HCC)    moderate carotid disease   RBBB    Smoker    Vocal cord polyps     ALLERGIES:  is allergic to amlodipine, chantix [varenicline tartrate], clarithromycin, lisinopril, simvastatin, wellbutrin [bupropion hcl], lipitor [atorvastatin], and sertraline.  MEDICATIONS:  Current Outpatient Medications  Medication Sig Dispense Refill   acetaminophen (TYLENOL) 500 MG tablet Take 2 tablets (1,000 mg total) by mouth every 6 (six) hours. 30 tablet 0   ALPRAZolam (XANAX XR) 1 MG 24 hr tablet 1  qhs 30 tablet 4   ALPRAZolam (XANAX) 0.5 MG tablet 2  qam   2  _0 ;00 120 tablet 5   Ascorbic Acid (VITAMIN C) 1000 MG tablet Take 1,000 mg by mouth daily.     aspirin 81  MG tablet Take 1 tablet (81 mg total) by mouth daily. Restart on 08/31/20 30 tablet    carboxymethylcellulose (REFRESH PLUS) 0.5 % SOLN Place 1-2 drops into both eyes daily.     citalopram (CELEXA) 10 MG tablet Take 1 tablet (10 mg total) by mouth daily. 30 tablet 1   folic acid (FOLVITE) 1 MG tablet Take 1 tablet by mouth once daily 30 tablet 0   loratadine (CLARITIN) 10 MG tablet Take 10 mg by mouth daily as needed for allergies.     mirtazapine (REMERON) 45 MG tablet Take 1 tablet (45 mg total) by mouth at bedtime. 30 tablet 3   omeprazole (PRILOSEC) 40 MG capsule TAKE 1  CAPSULE (40 MG TOTAL) BY MOUTH 2 (TWO) TIMES DAILY BEFORE A MEAL. 180 capsule 0   polyethylene glycol (MIRALAX / GLYCOLAX) 17 g packet Take 17 g by mouth 2 (two) times daily. 72 each 0   pravastatin (PRAVACHOL) 40 MG tablet TAKE 1 TABLET (40 MG TOTAL) BY MOUTH EVERY EVENING. 90 tablet 0   Probiotic Product (PROBIOTIC DAILY PO) Take 1 capsule by mouth daily.     prochlorperazine (COMPAZINE) 10 MG tablet Take 1 tablet (10 mg total) by mouth every 6 (six) hours as needed for nausea or vomiting. 30 tablet 0   senna-docusate (SENOKOT-S) 8.6-50 MG tablet Take 1 tablet by mouth 2 (two) times daily. (Patient taking differently: Take 1 tablet by mouth daily as needed for mild constipation.)     simethicone (MYLICON) 80 MG chewable tablet Chew 1 tablet (80 mg total) by mouth 4 (four) times daily as needed for flatulence (Bloating). 30 tablet 0   Wheat Dextrin (BENEFIBER) POWD Take 1 Scoop by mouth 2 (two) times daily as needed (constipation).     No current facility-administered medications for this visit.   Facility-Administered Medications Ordered in Other Visits  Medication Dose Route Frequency Provider Last Rate Last Admin   0.9 %  sodium chloride infusion   Intravenous Once Curt Bears, MD       pembrolizumab [facility]) 200 mg in sodium chloride 0.9 % 50 mL chemo infusion  200 mg Intravenous Once Curt Bears, MD       PEMEtrexed (ALIMTA) 700 mg in sodium chloride 0.9 % 100 mL chemo infusion  500 mg/m2 (Treatment Plan Recorded) Intravenous Once Curt Bears, MD       prochlorperazine (COMPAZINE) tablet 10 mg  10 mg Oral Once Curt Bears, MD        SURGICAL HISTORY:  Past Surgical History:  Procedure Laterality Date   ABDOMINAL HYSTERECTOMY  0947   APPLICATION OF CRANIAL NAVIGATION N/A 08/24/2020   Procedure: APPLICATION OF CRANIAL NAVIGATION;  Surgeon: Judith Part, MD;  Location: [facility];  Service: Neurosurgery;  Laterality: N/A;   COLONOSCOPY     COLONOSCOPY W/  POLYPECTOMY  2009   CRANIOTOMY Left 08/24/2020   Procedure: Left Craniotomy for Tumor Resection with Brainlab;  Surgeon: Judith Part, MD;  Location: [facility];  Service: Neurosurgery;  Laterality: Left;   HEMORRHOID SURGERY     INTERCOSTAL NERVE BLOCK Right 09/24/2020   Procedure: INTERCOSTAL NERVE BLOCK;  Surgeon: Melrose Nakayama, MD;  Location: [facility];  Service: Thoracic;  Laterality: Right;   JOINT REPLACEMENT     LYMPH NODE DISSECTION Right 09/24/2020   Procedure: LYMPH NODE DISSECTION;  Surgeon: Melrose Nakayama, MD;  Location: [facility];  Service: Thoracic;  Laterality: Right;   POLYPECTOMY     right total knee arthroplasty  Dr. Emeterio Reeve 06-04-18   [place] SURGERY  08/16/2009   TOTAL KNEE ARTHROPLASTY Left 02/13/2014   Procedure: TOTAL KNEE ARTHROPLASTY;  Surgeon: Alta Corning, MD;  Location: [facility];  Service: Orthopedics;  Laterality: Left;   TOTAL KNEE ARTHROPLASTY Right 06/04/2018   Procedure: RIGHT TOTAL KNEE ARTHROPLASTY;  Surgeon: Dorna Leitz, MD;  Location: [facility];  Service: Orthopedics;  Laterality: Right;  Adductor Block   TUBAL LIGATION      REVIEW OF SYSTEMS:   Review of Systems  Constitutional: Negative for appetite change, chills, fatigue, fever and unexpected weight change.  HENT:   Negative for mouth sores, nosebleeds, sore throat and trouble swallowing.   Eyes: Negative for eye problems and icterus.  Respiratory: Negative for cough, hemoptysis, shortness of breath and wheezing.   Cardiovascular: Negative for chest pain and leg swelling.  Gastrointestinal: Negative for abdominal pain, constipation, diarrhea, nausea and vomiting.  Genitourinary: Negative for bladder incontinence, difficulty urinating, dysuria, frequency and hematuria.   Musculoskeletal: Negative for back pain, gait problem, neck pain and neck stiffness.  Skin: Negative for itching and rash.  Neurological: Negative for dizziness, extremity weakness, gait problem, headaches, light-headedness and  seizures.  Hematological: Negative for adenopathy. Does not bruise/bleed easily.  Psychiatric/Behavioral: Negative for confusion, depression and sleep disturbance. The patient is not nervous/anxious.     PHYSICAL EXAMINATION:  Blood pressure (!) 144/86, pulse 81, temperature (!) 96.8 F (36 C), temperature source Tympanic, resp. rate 18, weight 113 lb 4 oz (51.4 kg), SpO2 100 %.  ECOG PERFORMANCE STATUS: 1  Physical Exam  Constitutional: Oriented to person, place, and time and thin appearing female and in no distress. HENT: Head: Normocephalic and atraumatic. Mouth/Throat: Oropharynx is clear and moist. No oropharyngeal exudate. No thrush.  Eyes: Conjunctivae are normal. Right eye exhibits no discharge. Left eye exhibits no discharge. No scleral icterus. Neck: Normal range of motion. Neck supple. Cardiovascular: Normal rate, regular rhythm, systolic murmur noted and intact distal pulses.   Pulmonary/Chest: Effort normal. Decreased breath sounds in right lung base. No respiratory distress. No wheezes. No rales. Abdominal: Soft. Bowel sounds are normal. Exhibits no distension and no mass. There is no tenderness.  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 No weakness in hands. No decreased sensation. No swelling or erythema. Some tenderness to palpation over MCP. Normal range of motion. Exhibits no edema.  Lymphadenopathy:    No cervical adenopathy.  Neurological: Alert and oriented to person, place, and time. Exhibits normal muscle tone. Gait normal. Coordination normal. Skin: Skin is warm and dry. No rash noted. Not diaphoretic. No erythema. No pallor.  Psychiatric: Mood, memory and judgment normal. Vitals reviewed  LABORATORY DATA: Lab Results  Component Value Date   WBC 4.7 04/11/2021   HGB 10.4 (L) 04/11/2021   HCT 33.9 (L) 04/11/2021   MCV 88.7 04/11/2021   PLT 386 04/11/2021      Chemistry      Component Value Date/Time   NA 139 04/11/2021 1011   K 3.7 04/11/2021  1011   CL 104 04/11/2021 1011   CO2 29 04/11/2021 1011   BUN 10 04/11/2021 1011   CREATININE 0.67 04/11/2021 1011   CREATININE 0.79 03/14/2020 1016      Component Value Date/Time   CALCIUM 9.9 04/11/2021 1011   ALKPHOS 78 04/11/2021 1011   AST 27 04/11/2021 1011   ALT 13 04/11/2021 1011   BILITOT 0.3 04/11/2021 1011       RADIOGRAPHIC STUDIES:  CT CHEST ABDOMEN PELVIS W CONTRAST  Result Date: 03/14/2021 CLINICAL DATA:  Status post right upper lobectomy for lung cancer with chemotherapy. Hysterectomy. Ex-smoker. Known brain metastasis. EXAM: CT CHEST, ABDOMEN, AND PELVIS WITH CONTRAST TECHNIQUE: Multidetector CT imaging of the chest, abdomen and pelvis was performed following the standard protocol during bolus administration of intravenous contrast. CONTRAST:  39m ISOVUE-370 IOPAMIDOL (ISOVUE-370) INJECTION 76% COMPARISON:  01/16/2021 FINDINGS: CT CHEST FINDINGS Cardiovascular: Aortic atherosclerosis. Tortuous thoracic aorta. Normal heart size, without pericardial effusion. Lad coronary artery calcification. No central pulmonary embolism, on this non-dedicated study. Mediastinum/Nodes: No supraclavicular adenopathy. No mediastinal or hilar adenopathy. Lungs/Pleura: Small right pleural effusion, similar. Status post right upper lobectomy. Musculoskeletal: No acute osseous abnormality. CT ABDOMEN PELVIS FINDINGS Hepatobiliary: Normal liver. Normal gallbladder, without biliary ductal dilatation. Pancreas: Normal, without mass or ductal dilatation. Spleen: Normal in size, without focal abnormality. Adrenals/Urinary Tract: Normal adrenal glands. Normal kidneys, without hydronephrosis. Normal urinary bladder. Stomach/Bowel: Normal stomach, without wall thickening. Normal colon, appendix, and terminal ileum. Normal small bowel. Vascular/Lymphatic: Aortic atherosclerosis. No abdominopelvic adenopathy. Reproductive: Hysterectomy.  No adnexal mass. Other: Moderate pelvic floor laxity. No significant free  fluid. No free intraperitoneal air. No evidence of omental or peritoneal disease. Musculoskeletal: Lipoma within the right thigh musculature including at 1.9 cm. L3-5 fixation. Advanced thoracolumbar spondylosis. IMPRESSION: 1. Status post right upper lobectomy. No findings of recurrent or metastatic disease. 2. Similar small right pleural effusion. 3. Coronary artery atherosclerosis. Aortic Atherosclerosis (ICD10-I70.0). Electronically Signed   By: KAbigail MiyamotoM.D.   On: 03/14/2021 20:45   VAS [place]  Result Date: 03/20/2021 Carotid Arterial Duplex Study Patient Name:  MSTEVEY STAPLETON Date of Exam:   03/19/2021 Medical Rec #: [id number]       Accession #:    [id number] of Birth: [birth year]        Patient Gender: F Patient Age:   89years Exam Location:  [facility] Procedure:      VAS [place] Referring Phys: SMarzetta BoardBURNS --------------------------------------------------------------------------------  Indications:       Carotid artery disease and patient reports occasional blurred                    vision. She denies any other cerebrovascular symptoms. Risk Factors:      Hypertension, hyperlipidemia, Diabetes, past history of                    smoking. Comparison Study:  In 02/2020, a carotid duplex showed velocities of 134/49 cm/s                    in the RICA and 2387/56cm/s in the LICA. Performing Technologist: KSharlett IlesRVT  Examination Guidelines: A complete evaluation includes B-mode imaging, spectral Doppler, color Doppler, and power Doppler as needed of all accessible portions of each vessel. Bilateral testing is considered an integral part of a complete examination. Limited examinations for reoccurring indications may be performed as noted.  Right Carotid Findings: +----------+--------+--------+--------+------------------+------------------+           PSV cm/sEDV cm/sStenosisPlaque DescriptionComments            +----------+--------+--------+--------+------------------+------------------+ CCA Prox  57      11                                                   +----------+--------+--------+--------+------------------+------------------+  CCA Distal45      12                                intimal thickening +----------+--------+--------+--------+------------------+------------------+ ICA Prox  131     48      40-59%  hyperechoic                          +----------+--------+--------+--------+------------------+------------------+ ICA Mid   181     50                                tortuous           +----------+--------+--------+--------+------------------+------------------+ ICA Distal47      20                                tortuous           +----------+--------+--------+--------+------------------+------------------+ ECA       95      7               heterogenous                         +----------+--------+--------+--------+------------------+------------------+ +----------+--------+-------+---------+-------------------+           PSV cm/sEDV cmsDescribe Arm Pressure (mmHG) +----------+--------+-------+---------+-------------------+ Subclavian101            Turbulent122                 +----------+--------+-------+---------+-------------------+ +---------+--------+--+--------+--+---------+ VertebralPSV cm/s41EDV cm/s14Antegrade +---------+--------+--+--------+--+---------+ Prominent right subclavian artery, measuring 1.7 cm. Left Carotid Findings: +----------+--------+--------+--------+-----------------+----------------------+           PSV cm/sEDV cm/sStenosisPlaque           Comments                                                 Description                             +----------+--------+--------+--------+-----------------+----------------------+ CCA Prox  54      8                                                        +----------+--------+--------+--------+-----------------+----------------------+ CCA Distal40      8                                intimal thickening     +----------+--------+--------+--------+-----------------+----------------------+ ICA Prox  232     81      60-79%  hyperechoic                             +----------+--------+--------+--------+-----------------+----------------------+ ICA Mid   101     35  tortuous and turbulent +----------+--------+--------+--------+-----------------+----------------------+ ICA Distal91      31                               tortuous               +----------+--------+--------+--------+-----------------+----------------------+ ECA       99      8               heterogenous     turbulent              +----------+--------+--------+--------+-----------------+----------------------+ +----------+--------+--------+----------------+-------------------+           PSV cm/sEDV cm/sDescribe        Arm Pressure (mmHG) +----------+--------+--------+----------------+-------------------+ [id number]             Multiphasic, VEH209                 +----------+--------+--------+----------------+-------------------+ +---------+--------+--+--------+--+---------+ VertebralPSV cm/s46EDV cm/s19Antegrade +---------+--------+--+--------+--+---------+   Summary: Right Carotid: Velocities in the right ICA are consistent with a 40-59%                stenosis. Stable RICA velocities. Left Carotid: Velocities in the left ICA are consistent with a 60-79% stenosis.               Stable LICA velocities. Vertebrals:  Bilateral vertebral arteries demonstrate antegrade flow. Subclavians: Right subclavian artery flow was disturbed. Normal flow              hemodynamics were seen in the left subclavian artery. Prominent              right subclavian artery, measuring 1.7 cm. *See table(s) above for measurements and observations.  Suggest follow up study in 12 months. Electronically signed by Kathlyn Sacramento MD on 03/20/2021 at 4:26:17 PM.    Final      ASSESSMENT/PLAN:  This is a very pleasant 70 year old African-American female diagnosed with stage IV (T3, N0, M1 B) non-small cell lung cancer, adenocarcinoma.  She presented with a right upper lobe lung mass with a solitary brain metastasis.  She was diagnosed in February 2022.  Her PD-L1 expression is 90%.  She does not have any actionable mutations.   The patient completed SRS followed by craniotomy and resection on 08/23/2020 under the care of Dr. Tammi Klippel and craniotomy under the care of Dr. Zada Finders on 08/24/2020.   She then had a robot-assisted right upper lobectomy with en bloc wedge resection of the right middle lobe and lymph node dissection under the care of Dr. Roxan Hockey on September 24, 2020.   She underwent SRS to the two new subcentimeter brain metastases on 11/29/20.   The patient is currently undergoing systemic chemotherapy with carboplatin for an AUC of 5, Alimta 500 mg per metered squared, Keytruda 200 mg IV every 3 weeks.  She is status post 7 cycles and tolerated it well without any adverse side effects.  Starting from cycle #5, the patient started maintenance Alimta and Keytruda.   Labs were reviewed. Her CMP is still pending. Her last CMP was acceptable for treatment. Recommend she proceed with cycle #8 today as scheduled. If any concerning findings on her CMP, we will stop her treatment.   We will see her back for follow-up visit in 3 weeks for evaluation before starting cycle #9.  She will continue taking Remeron for her decreased appetite.  The patient was advised to call immediately if she has any concerning symptoms in  the interval. The patient voices understanding of current disease status and treatment options and is in agreement with the current care plan. All questions were answered. The patient knows to call the clinic with any problems,  questions or concerns. We can certainly see the patient much sooner if necessary      No orders of the defined types were placed in this encounter.    The total time spent in the appointment was 20-29 minutes.   Ansley Stanwood L Khyrin Trevathan, PA-C 04/11/21

## 2021-04-09 DIAGNOSIS — F419 Anxiety disorder, unspecified: Secondary | ICD-10-CM | POA: Diagnosis not present

## 2021-04-11 ENCOUNTER — Inpatient Hospital Stay: Payer: Medicare HMO

## 2021-04-11 ENCOUNTER — Inpatient Hospital Stay: Payer: Medicare HMO | Attending: Internal Medicine

## 2021-04-11 ENCOUNTER — Inpatient Hospital Stay (HOSPITAL_BASED_OUTPATIENT_CLINIC_OR_DEPARTMENT_OTHER): Payer: Medicare HMO | Admitting: Physician Assistant

## 2021-04-11 ENCOUNTER — Other Ambulatory Visit: Payer: Self-pay

## 2021-04-11 VITALS — BP 144/86 | HR 81 | Temp 96.8°F | Resp 18 | Wt 113.2 lb

## 2021-04-11 DIAGNOSIS — Z5111 Encounter for antineoplastic chemotherapy: Secondary | ICD-10-CM

## 2021-04-11 DIAGNOSIS — E785 Hyperlipidemia, unspecified: Secondary | ICD-10-CM | POA: Insufficient documentation

## 2021-04-11 DIAGNOSIS — C7931 Secondary malignant neoplasm of brain: Secondary | ICD-10-CM | POA: Diagnosis not present

## 2021-04-11 DIAGNOSIS — Z902 Acquired absence of lung [part of]: Secondary | ICD-10-CM | POA: Insufficient documentation

## 2021-04-11 DIAGNOSIS — C3411 Malignant neoplasm of upper lobe, right bronchus or lung: Secondary | ICD-10-CM

## 2021-04-11 DIAGNOSIS — K219 Gastro-esophageal reflux disease without esophagitis: Secondary | ICD-10-CM | POA: Insufficient documentation

## 2021-04-11 DIAGNOSIS — F32A Depression, unspecified: Secondary | ICD-10-CM | POA: Insufficient documentation

## 2021-04-11 DIAGNOSIS — I2510 Atherosclerotic heart disease of native coronary artery without angina pectoris: Secondary | ICD-10-CM | POA: Insufficient documentation

## 2021-04-11 DIAGNOSIS — H538 Other visual disturbances: Secondary | ICD-10-CM | POA: Diagnosis not present

## 2021-04-11 DIAGNOSIS — Z5112 Encounter for antineoplastic immunotherapy: Secondary | ICD-10-CM | POA: Diagnosis not present

## 2021-04-11 DIAGNOSIS — Z79899 Other long term (current) drug therapy: Secondary | ICD-10-CM | POA: Insufficient documentation

## 2021-04-11 DIAGNOSIS — Z87891 Personal history of nicotine dependence: Secondary | ICD-10-CM | POA: Diagnosis not present

## 2021-04-11 DIAGNOSIS — Z888 Allergy status to other drugs, medicaments and biological substances status: Secondary | ICD-10-CM | POA: Insufficient documentation

## 2021-04-11 DIAGNOSIS — Z8719 Personal history of other diseases of the digestive system: Secondary | ICD-10-CM | POA: Insufficient documentation

## 2021-04-11 DIAGNOSIS — I10 Essential (primary) hypertension: Secondary | ICD-10-CM | POA: Insufficient documentation

## 2021-04-11 DIAGNOSIS — J90 Pleural effusion, not elsewhere classified: Secondary | ICD-10-CM | POA: Diagnosis not present

## 2021-04-11 DIAGNOSIS — F419 Anxiety disorder, unspecified: Secondary | ICD-10-CM | POA: Insufficient documentation

## 2021-04-11 DIAGNOSIS — I700 Atherosclerosis of aorta: Secondary | ICD-10-CM | POA: Insufficient documentation

## 2021-04-11 LAB — CBC WITH DIFFERENTIAL (CANCER CENTER ONLY)
Abs Immature Granulocytes: 0.01 10*3/uL (ref 0.00–0.07)
Basophils Absolute: 0.1 10*3/uL (ref 0.0–0.1)
Basophils Relative: 1 %
Eosinophils Absolute: 0.1 10*3/uL (ref 0.0–0.5)
Eosinophils Relative: 1 %
HCT: 33.9 % — ABNORMAL LOW (ref 36.0–46.0)
Hemoglobin: 10.4 g/dL — ABNORMAL LOW (ref 12.0–15.0)
Immature Granulocytes: 0 %
Lymphocytes Relative: 28 %
Lymphs Abs: 1.3 10*3/uL (ref 0.7–4.0)
MCH: 27.2 pg (ref 26.0–34.0)
MCHC: 30.7 g/dL (ref 30.0–36.0)
MCV: 88.7 fL (ref 80.0–100.0)
Monocytes Absolute: 0.5 10*3/uL (ref 0.1–1.0)
Monocytes Relative: 10 %
Neutro Abs: 2.8 10*3/uL (ref 1.7–7.7)
Neutrophils Relative %: 60 %
Platelet Count: 386 10*3/uL (ref 150–400)
RBC: 3.82 MIL/uL — ABNORMAL LOW (ref 3.87–5.11)
RDW: 14 % (ref 11.5–15.5)
WBC Count: 4.7 10*3/uL (ref 4.0–10.5)
nRBC: 0 % (ref 0.0–0.2)

## 2021-04-11 LAB — CMP (CANCER CENTER ONLY)
ALT: 13 U/L (ref 0–44)
AST: 27 U/L (ref 15–41)
Albumin: 4.1 g/dL (ref 3.5–5.0)
Alkaline Phosphatase: 78 U/L (ref 38–126)
Anion gap: 6 (ref 5–15)
BUN: 10 mg/dL (ref 8–23)
CO2: 29 mmol/L (ref 22–32)
Calcium: 9.9 mg/dL (ref 8.9–10.3)
Chloride: 104 mmol/L (ref 98–111)
Creatinine: 0.67 mg/dL (ref 0.44–1.00)
GFR, Estimated: >60 mL/min (ref >60)
Glucose, Bld: 114 mg/dL — ABNORMAL HIGH (ref 70–99)
Potassium: 3.7 mmol/L (ref 3.5–5.1)
Sodium: 139 mmol/L (ref 135–145)
Total Bilirubin: 0.3 mg/dL (ref 0.3–1.2)
Total Protein: 7.9 g/dL (ref 6.5–8.1)

## 2021-04-11 LAB — TSH: TSH: 0.699 u[IU]/mL (ref 0.308–3.960)

## 2021-04-11 MED ORDER — CYANOCOBALAMIN 1000 MCG/ML IJ SOLN: 1000.0000 ug | Freq: Once | INTRAMUSCULAR | Status: DC

## 2021-04-11 MED ORDER — SODIUM CHLORIDE 0.9 % IV SOLN
200.0000 mg | Freq: Once | INTRAVENOUS | Status: AC
  Administered 2021-04-11: 200 mg via INTRAVENOUS
  Filled 2021-04-11: qty 8

## 2021-04-11 MED ORDER — SODIUM CHLORIDE 0.9 % IV SOLN
500.0000 mg/m2 | Freq: Once | INTRAVENOUS | Status: AC
  Administered 2021-04-11: 700 mg via INTRAVENOUS
  Filled 2021-04-11: qty 20

## 2021-04-11 MED ORDER — PROCHLORPERAZINE MALEATE 10 MG PO TABS
10.0000 mg | ORAL_TABLET | Freq: Once | ORAL | Status: AC
  Administered 2021-04-11: 10 mg via ORAL
  Filled 2021-04-11: qty 1

## 2021-04-11 MED ORDER — SODIUM CHLORIDE 0.9 % IV SOLN: Freq: Once | INTRAVENOUS | Status: AC

## 2021-04-11 NOTE — Progress Notes (Signed)
Ok to proceed with treatment without CMP per Provider

## 2021-04-12 ENCOUNTER — Ambulatory Visit (INDEPENDENT_AMBULATORY_CARE_PROVIDER_SITE_OTHER): Payer: Medicare HMO

## 2021-04-12 DIAGNOSIS — Z0000 Encounter for general adult medical examination without abnormal findings: Secondary | ICD-10-CM | POA: Diagnosis not present

## 2021-04-12 NOTE — Patient Instructions (Signed)
Ms. Bjorn , Thank you for taking time to come for your Medicare Wellness Visit. I appreciate your ongoing commitment to your health goals. Please review the following plan we discussed and let me know if I can assist you in the future.   Screening recommendations/referrals: Colonoscopy: 05/20/2019; due every 5 years Mammogram: 05/30/2020; scheduled for 05/06/2021 Bone Density: 09/02/2016; due every 5 years Recommended yearly ophthalmology/optometry visit for glaucoma screening and checkup Recommended yearly dental visit for hygiene and checkup  Vaccinations: Influenza vaccine: due Fall 2022 Pneumococcal vaccine: 05/30/2014, 02/27/2016 Tdap vaccine: 05/30/2014; due every 10 years Shingles vaccine: need documentation   Covid-19: 08/22/2019, 09/20/2019, 05/01/2020  Advanced directives: Advance directive discussed with you today. Even though you declined this today please call our office should you change your mind and we can give you the proper paperwork for you to fill out.  Conditions/risks identified: Yes; My goal is to keep getting stronger and increase my physical exercise.  Next appointment: Please schedule your next Medicare Wellness Visit with your Nurse Health Advisor in 1 year by calling [phone].   Preventive Care 72 Years and Older, Female Preventive care refers to lifestyle choices and visits with your health care provider that can promote health and wellness. What does preventive care include? A yearly physical exam. This is also called an annual well check. Dental exams once or twice a year. Routine eye exams. Ask your health care provider how often you should have your eyes checked. Personal lifestyle choices, including: Daily care of your teeth and gums. Regular physical activity. Eating a healthy diet. Avoiding tobacco and drug use. Limiting alcohol use. Practicing safe sex. Taking low-dose aspirin every day. Taking vitamin and mineral supplements as recommended by  your health care provider. What happens during an annual well check? The services and screenings done by your health care provider during your annual well check will depend on your age, overall health, lifestyle risk factors, and family history of disease. Counseling  Your health care provider may ask you questions about your: Alcohol use. Tobacco use. Drug use. Emotional well-being. Home and relationship well-being. Sexual activity. Eating habits. History of falls. Memory and ability to understand (cognition). Work and work Statistician. Reproductive health. Screening  You may have the following tests or measurements: Height, weight, and BMI. Blood pressure. Lipid and cholesterol levels. These may be checked every 5 years, or more frequently if you are over 66 years old. Skin check. Lung cancer screening. You may have this screening every year starting at age 87 if you have a 30-pack-year history of smoking and currently smoke or have quit within the past 15 years. Fecal occult blood test (FOBT) of the stool. You may have this test every year starting at age 72. Flexible sigmoidoscopy or colonoscopy. You may have a sigmoidoscopy every 5 years or a colonoscopy every 10 years starting at age 72. Hepatitis C blood test. Hepatitis B blood test. Sexually transmitted disease (STD) testing. Diabetes screening. This is done by checking your blood sugar (glucose) after you have not eaten for a while (fasting). You may have this done every 1-3 years. Bone density scan. This is done to screen for osteoporosis. You may have this done starting at age 72. Mammogram. This may be done every 1-2 years. Talk to your health care provider about how often you should have regular mammograms. Talk with your health care provider about your test results, treatment options, and if necessary, the need for more tests. Vaccines  Your health care provider  may recommend certain vaccines, such as: Influenza  vaccine. This is recommended every year. Tetanus, diphtheria, and acellular pertussis (Tdap, Td) vaccine. You may need a Td booster every 10 years. Zoster vaccine. You may need this after age 78. Pneumococcal 13-valent conjugate (PCV13) vaccine. One dose is recommended after age 72. Pneumococcal polysaccharide (PPSV23) vaccine. One dose is recommended after age 72. Talk to your health care provider about which screenings and vaccines you need and how often you need them. This information is not intended to replace advice given to you by your health care provider. Make sure you discuss any questions you have with your health care provider. Document Released: 07/13/2015 Document Revised: 03/05/2016 Document Reviewed: 04/17/2015 Elsevier Interactive Patient Education  2017 [employer] Prevention in the Home Falls can cause injuries. They can happen to people of all ages. There are many things you can do to make your home safe and to help prevent falls. What can I do on the outside of my home? Regularly fix the edges of walkways and driveways and fix any cracks. Remove anything that might make you trip as you walk through a door, such as a raised step or threshold. Trim any bushes or trees on the path to your home. Use bright outdoor lighting. Clear any walking paths of anything that might make someone trip, such as rocks or tools. Regularly check to see if handrails are loose or broken. Make sure that both sides of any steps have handrails. Any raised decks and porches should have guardrails on the edges. Have any leaves, snow, or ice cleared regularly. Use sand or salt on walking paths during winter. Clean up any spills in your garage right away. This includes oil or grease spills. What can I do in the bathroom? Use night lights. Install grab bars by the toilet and in the tub and shower. Do not use towel bars as grab bars. Use non-skid mats or decals in the tub or shower. If you  need to sit down in the shower, use a plastic, non-slip stool. Keep the floor dry. Clean up any water that spills on the floor as soon as it happens. Remove soap buildup in the tub or shower regularly. Attach bath mats securely with double-sided non-slip rug tape. Do not have throw rugs and other things on the floor that can make you trip. What can I do in the bedroom? Use night lights. Make sure that you have a light by your bed that is easy to reach. Do not use any sheets or blankets that are too big for your bed. They should not hang down onto the floor. Have a firm chair that has side arms. You can use this for support while you get dressed. Do not have throw rugs and other things on the floor that can make you trip. What can I do in the kitchen? Clean up any spills right away. Avoid walking on wet floors. Keep items that you use a lot in easy-to-reach places. If you need to reach something above you, use a strong step stool that has a grab bar. Keep electrical cords out of the way. Do not use floor polish or wax that makes floors slippery. If you must use wax, use non-skid floor wax. Do not have throw rugs and other things on the floor that can make you trip. What can I do with my stairs? Do not leave any items on the stairs. Make sure that there are handrails on both sides  of the stairs and use them. Fix handrails that are broken or loose. Make sure that handrails are as long as the stairways. Check any carpeting to make sure that it is firmly attached to the stairs. Fix any carpet that is loose or worn. Avoid having throw rugs at the top or bottom of the stairs. If you do have throw rugs, attach them to the floor with carpet tape. Make sure that you have a light switch at the top of the stairs and the bottom of the stairs. If you do not have them, ask someone to add them for you. What else can I do to help prevent falls? Wear shoes that: Do not have high heels. Have rubber  bottoms. Are comfortable and fit you well. Are closed at the toe. Do not wear sandals. If you use a stepladder: Make sure that it is fully opened. Do not climb a closed stepladder. Make sure that both sides of the stepladder are locked into place. Ask someone to hold it for you, if possible. Clearly mark and make sure that you can see: Any grab bars or handrails. First and last steps. Where the edge of each step is. Use tools that help you move around (mobility aids) if they are needed. These include: Canes. Walkers. Scooters. Crutches. Turn on the lights when you go into a dark area. Replace any light bulbs as soon as they burn out. Set up your furniture so you have a clear path. Avoid moving your furniture around. If any of your floors are uneven, fix them. If there are any pets around you, be aware of where they are. Review your medicines with your doctor. Some medicines can make you feel dizzy. This can increase your chance of falling. Ask your doctor what other things that you can do to help prevent falls. This information is not intended to replace advice given to you by your health care provider. Make sure you discuss any questions you have with your health care provider. Document Released: 04/12/2009 Document Revised: 11/22/2015 Document Reviewed: 07/21/2014 Elsevier Interactive Patient Education  2017 [employer].

## 2021-04-12 NOTE — Progress Notes (Signed)
I connected with Hannah [name] today by telephone and verified that I am speaking with the correct person using two identifiers. Location patient: home Location provider: work Persons participating in the virtual visit: patient, provider.   I discussed the limitations, risks, security and privacy concerns of performing an evaluation and management service by telephone and the availability of in person appointments. I also discussed with the patient that there may be a patient responsible charge related to this service. The patient expressed understanding and verbally consented to this telephonic visit.    Interactive audio and video telecommunications were attempted between this provider and patient, however failed, due to patient having technical difficulties OR patient did not have access to video capability.  We continued and completed visit with audio only.  Some vital signs may be absent or patient reported.   Time Spent with patient on telephone encounter: 40 minutes  Subjective:   Hannah [name] is a 70 y.o. female who presents for Medicare Annual (Subsequent) preventive examination.  Review of Systems     Cardiac Risk Factors include: advanced age (>45men, >22 women);dyslipidemia;family history of premature cardiovascular disease     Objective:    Today's Vitals   04/12/21 0918  PainSc: 0-No pain   There is no height or weight on file to calculate BMI.  Advanced Directives 04/12/2021 02/12/2021 02/07/2021 01/17/2021 11/22/2020 11/22/2020 11/15/2020  Does Patient Have a Medical Advance Directive? No No No No No No No  Does patient want to make changes to medical advance directive? - - - - - - -  Would patient like information on creating a medical advance directive? No - Patient declined No - Patient declined No - Patient declined No - Patient declined No - Patient declined No - Patient declined No - Patient declined    Current Medications (verified) Outpatient Encounter  Medications as of 04/12/2021  Medication Sig   acetaminophen (TYLENOL) 500 MG tablet Take 2 tablets (1,000 mg total) by mouth every 6 (six) hours.   ALPRAZolam (XANAX XR) 1 MG 24 hr tablet 1  qhs   ALPRAZolam (XANAX) 0.5 MG tablet 2  qam   2  @2 ;00   Ascorbic Acid (VITAMIN C) 1000 MG tablet Take 1,000 mg by mouth daily.   aspirin 81 MG tablet Take 1 tablet (81 mg total) by mouth daily. Restart on 08/31/20   carboxymethylcellulose (REFRESH PLUS) 0.5 % SOLN Place 1-2 drops into both eyes daily.   citalopram (CELEXA) 10 MG tablet Take 1 tablet (10 mg total) by mouth daily.   folic acid (FOLVITE) 1 MG tablet Take 1 tablet by mouth once daily   loratadine (CLARITIN) 10 MG tablet Take 10 mg by mouth daily as needed for allergies.   mirtazapine (REMERON) 45 MG tablet Take 1 tablet (45 mg total) by mouth at bedtime.   omeprazole (PRILOSEC) 40 MG capsule TAKE 1 CAPSULE (40 MG TOTAL) BY MOUTH 2 (TWO) TIMES DAILY BEFORE A MEAL.   polyethylene glycol (MIRALAX / GLYCOLAX) 17 g packet Take 17 g by mouth 2 (two) times daily.   pravastatin (PRAVACHOL) 40 MG tablet TAKE 1 TABLET (40 MG TOTAL) BY MOUTH EVERY EVENING.   Probiotic Product (PROBIOTIC DAILY PO) Take 1 capsule by mouth daily.   prochlorperazine (COMPAZINE) 10 MG tablet Take 1 tablet (10 mg total) by mouth every 6 (six) hours as needed for nausea or vomiting.   senna-docusate (SENOKOT-S) 8.6-50 MG tablet Take 1 tablet by mouth 2 (two) times daily. (Patient taking differently: Take  1 tablet by mouth daily as needed for mild constipation.)   simethicone (MYLICON) 80 MG chewable tablet Chew 1 tablet (80 mg total) by mouth 4 (four) times daily as needed for flatulence (Bloating).   Wheat Dextrin (BENEFIBER) POWD Take 1 Scoop by mouth 2 (two) times daily as needed (constipation).   No facility-administered encounter medications on file as of 04/12/2021.    Allergies (verified) Amlodipine, Chantix [varenicline tartrate], Clarithromycin, Lisinopril,  Simvastatin, Wellbutrin [bupropion hcl], Lipitor [atorvastatin], and Sertraline   History: Past Medical History:  Diagnosis Date   Allergy    Anemia    Anxiety    Arthritis    Carpal tunnel syndrome    Constipation    senna C stool softeners help    Depression    Diverticulosis    Dyslipidemia    External hemorrhoids    GERD (gastroesophageal reflux disease)    Heart murmur    mild-moderate AR   Hiatal hernia    Hyperlipidemia    on meds    Hypertension    Internal hemorrhoids    lung ca with brain mets 07/2020   Osteoarthritis    Pre-diabetes    PVD (peripheral vascular disease) (HCC)    moderate carotid disease   RBBB    Smoker    Vocal cord polyps    Past Surgical History:  Procedure Laterality Date   ABDOMINAL HYSTERECTOMY  1610   APPLICATION OF CRANIAL NAVIGATION N/A 08/24/2020   Procedure: APPLICATION OF CRANIAL NAVIGATION;  Surgeon: Judith Part, MD;  Location: [facility];  Service: Neurosurgery;  Laterality: N/A;   COLONOSCOPY     COLONOSCOPY W/ POLYPECTOMY  2009   CRANIOTOMY Left 08/24/2020   Procedure: Left Craniotomy for Tumor Resection with Brainlab;  Surgeon: Judith Part, MD;  Location: [facility];  Service: Neurosurgery;  Laterality: Left;   HEMORRHOID SURGERY     INTERCOSTAL NERVE BLOCK Right 09/24/2020   Procedure: INTERCOSTAL NERVE BLOCK;  Surgeon: Melrose Nakayama, MD;  Location: [facility];  Service: Thoracic;  Laterality: Right;   JOINT REPLACEMENT     LYMPH NODE DISSECTION Right 09/24/2020   Procedure: LYMPH NODE DISSECTION;  Surgeon: Melrose Nakayama, MD;  Location: [facility];  Service: Thoracic;  Laterality: Right;   POLYPECTOMY     right total knee arthroplasty     Dr. Emeterio Reeve 06-04-18   [place]  08/16/2009   TOTAL KNEE ARTHROPLASTY Left 02/13/2014   Procedure: TOTAL KNEE ARTHROPLASTY;  Surgeon: Alta Corning, MD;  Location: [facility];  Service: Orthopedics;  Laterality: Left;   TOTAL KNEE ARTHROPLASTY Right 06/04/2018   Procedure: RIGHT  TOTAL KNEE ARTHROPLASTY;  Surgeon: Dorna Leitz, MD;  Location: [facility];  Service: Orthopedics;  Laterality: Right;  Adductor Block   TUBAL LIGATION     Family History  Problem Relation Age of Onset   Cancer Father    Prostate cancer Father    Diabetes Sister    Cancer Sister    Stroke Maternal Grandfather    Colitis Maternal Aunt    Breast cancer Maternal Aunt    Colon cancer Neg Hx    Colon polyps Neg Hx    Rectal cancer Neg Hx    Stomach cancer Neg Hx    Esophageal cancer Neg Hx    Social History   Socioeconomic History   Marital status: Married    Spouse name: Not on file   Number of children: 2   Years of education: Not on file   Highest  education level: Not on file  Occupational History   Not on file  Tobacco Use   Smoking status: Former    Packs/day: 0.25    Years: 40.00    Pack years: 10.00    Types: Cigarettes    Quit date: 07/09/2020    Years since quitting: 0.7   Smokeless tobacco: Never   Tobacco comments:    PACK WILL LAST 3 days  Vaping Use   Vaping Use: Never used  Substance and Sexual Activity   Alcohol use: No   Drug use: No   Sexual activity: Not Currently  Other Topics Concern   Not on file  Social History Narrative   Not on file   Social Determinants of Health   Financial Resource Strain: Low Risk    Difficulty of Paying Living Expenses: Not hard at all  Food Insecurity: No Food Insecurity   Worried About Charity fundraiser in the Last Year: Never true   [place] in the Last Year: Never true  Transportation Needs: No Transportation Needs   Lack of Transportation (Medical): No   Lack of Transportation (Non-Medical): No  Physical Activity: Sufficiently Active   Days of Exercise per Week: 3 days   Minutes of Exercise per Session: 60 min  Stress: No Stress Concern Present   Feeling of Stress : Not at all  Social Connections: Socially Integrated   Frequency of Communication with Friends and Family: More than three times a week    Frequency of Social Gatherings with Friends and Family: More than three times a week   Attends Religious Services: More than 4 times per year   Active Member of [employer] or Organizations: Yes   Attends Music therapist: More than 4 times per year   Marital Status: Married    Tobacco Counseling Counseling given: Not Answered Tobacco comments: PACK WILL LAST 3 days   Clinical Intake:  Pre-visit preparation completed: Yes  Pain : No/denies pain Pain Score: 0-No pain     Nutritional Risks: None Diabetes: No CBG done?: No Did pt. bring in CBG monitor from home?: No  How often do you need to have someone help you when you read instructions, pamphlets, or other written materials from your doctor or pharmacy?: 1 - Never What is the last grade level you completed in school?: HSG  Diabetic? yes  Interpreter Needed?: No  Information entered by :: Lisette Abu, LPN   Activities of Daily Living In your present state of health, do you have any difficulty performing the following activities: 04/12/2021  Hearing? N  Vision? N  Difficulty concentrating or making decisions? N  Walking or climbing stairs? N  Dressing or bathing? N  Doing errands, shopping? N  Preparing Food and eating ? N  Using the Toilet? N  In the past six months, have you accidently leaked urine? N  Do you have problems with loss of bowel control? N  Managing your Medications? N  Managing your Finances? N  Housekeeping or managing your Housekeeping? N  Some recent data might be hidden    Patient Care Team: [name], MD as PCP - General (Internal Medicine) Minus Breeding, MD as PCP - Cardiology (Cardiology) Knox Royalty, RN as Case Manager Charlton Haws, [facility] as Pharmacist (Pharmacist)  Indicate any recent Medical Services you may have received from other than [facility] providers in the past year (date may be approximate).     Assessment:   This is a  routine wellness  examination for [place].  Hearing/Vision screen Hearing Screening - Comments:: Patient denied any hearing difficulty.   No hearing aids.  Vision Screening - Comments:: Patient wears readers.  Eye exam done annually by: Clent Jacks, MD.  Dietary issues and exercise activities discussed: Current Exercise Habits: Structured exercise class, Type of exercise: walking;Other - see comments (stationary bike), Time (Minutes): 60, Frequency (Times/Week): 3, Weekly Exercise (Minutes/Week): 180, Intensity: Moderate, Exercise limited by: Other - see comments (Taking chemotherapy)   Goals Addressed               This Visit's Progress     Patient Stated (pt-stated)        My goal is to keep getting stronger and increase my physical exercise.      Depression Screen PHQ 2/9 Scores 04/12/2021 03/05/2021 12/27/2020 10/10/2020 01/11/2020 09/12/2019 12/23/2018  PHQ - 2 Score 0 1 0 1 0 2 1  PHQ- 9 Score - - - - - 4 -    Fall Risk Fall Risk  04/12/2021 12/27/2020 10/10/2020 01/11/2020 12/23/2018  Falls in the past year? 0 0 0 0 0  Comment - - - - -  Number falls in past yr: 0 0 0 0 0  Injury with Fall? 0 0 0 0 -  Comment - N/A- no falls reported - - -  Risk for fall due to : No Fall Risks Medication side effect Medication side effect No Fall Risks -  Follow up Falls evaluation completed Falls prevention discussed Falls prevention discussed Falls evaluation completed -    FALL RISK PREVENTION PERTAINING TO THE HOME:  Any stairs in or around the home? No  If so, are there any without handrails? No  Home free of loose throw rugs in walkways, pet beds, electrical cords, etc? Yes  Adequate lighting in your home to reduce risk of falls? Yes   ASSISTIVE DEVICES UTILIZED TO PREVENT FALLS:  Life alert? No  Use of a cane, walker or w/c? No  Grab bars in the bathroom? No  Shower chair or bench in shower? No  Elevated toilet seat or a handicapped toilet? No   TIMED UP AND GO:  Was the test performed? No .   Length of time to ambulate 10 feet: n/a sec.   Gait steady and fast without use of assistive device  Cognitive Function: Normal cognitive status assessed by direct observation by this Nurse Health Advisor. No abnormalities found.   MMSE - Mini Mental State Exam 12/21/2017  Orientation to time 5  Orientation to Place 5  Registration 3  Attention/ Calculation 5  Recall 3  Language- name 2 objects 2  Language- repeat 1  Language- follow 3 step command 3  Language- read & follow direction 1  Write a sentence 1  Copy design 1  Total score 30        Immunizations Immunization History  Administered Date(s) Administered   Fluad Quad(high Dose 65+) 03/14/2019, 03/14/2020   Moderna SARS-COV2 Booster Vaccination 05/01/2020   Moderna Sars-Covid-2 Vaccination 08/22/2019, 09/20/2019   Pneumococcal Conjugate-13 05/30/2014   Pneumococcal Polysaccharide-23 02/27/2016   Td 08/01/2004   Tdap 05/30/2014    TDAP status: Up to date  Flu Vaccine status: Due, Education has been provided regarding the importance of this vaccine. Advised may receive this vaccine at local pharmacy or Health Dept. Aware to provide a copy of the vaccination record if obtained from local pharmacy or Health Dept. Verbalized acceptance and understanding.  Pneumococcal vaccine status: Up  to date  Covid-19 vaccine status: Completed vaccines  Qualifies for Shingles Vaccine? Yes   Zostavax completed No   Shingrix Completed?: Yes (per patient; need documentation)  Screening Tests Health Maintenance  Topic Date Due   COVID-19 Vaccine (4 - Booster for Moderna series) 07/24/2020   INFLUENZA VACCINE  01/28/2021   OPHTHALMOLOGY EXAM  05/30/2021   DEXA SCAN  09/02/2021   HEMOGLOBIN A1C  09/17/2021   FOOT EXAM  03/20/2022   URINE MICROALBUMIN  03/20/2022   MAMMOGRAM  05/30/2022   COLONOSCOPY (Pts 45-29yrs Insurance coverage will need to be confirmed)  05/19/2024   TETANUS/TDAP  05/30/2024   Hepatitis C Screening   Completed   HPV VACCINES  Aged Out   Zoster Vaccines- Shingrix  Discontinued    Health Maintenance  Health Maintenance Due  Topic Date Due   COVID-19 Vaccine (4 - Booster for Moderna series) 07/24/2020   INFLUENZA VACCINE  01/28/2021    Colorectal cancer screening: Type of screening: Colonoscopy. Completed 05/20/2019. Repeat every 5 years  Mammogram status: Completed 05/30/2020. Repeat every year (scheduled for 05/06/2021)  Bone Density status: Completed 09/02/2016. Results reflect: Bone density results: NORMAL. Repeat every 5 years.  Lung Cancer Screening: (Low Dose CT Chest recommended if Age 101-80 years, 30 pack-year currently smoking OR have quit w/in 15years.) does qualify.   Lung Cancer Screening Referral: no  Additional Screening:  Hepatitis C Screening: does qualify; Completed: yes  Vision Screening: Recommended annual ophthalmology exams for early detection of glaucoma and other disorders of the eye. Is the patient up to date with their annual eye exam?  Yes  Who is the provider or what is the name of the office in which the patient attends annual eye exams? Clent Jacks, MD. If pt is not established with a provider, would they like to be referred to a provider to establish care? No .   Dental Screening: Recommended annual dental exams for proper oral hygiene  Community Resource Referral / Chronic Care Management: CRR required this visit?  No   CCM required this visit?  No      Plan:     I have personally reviewed and noted the following in the patient's chart:   Medical and social history Use of alcohol, tobacco or illicit drugs  Current medications and supplements including opioid prescriptions.  Functional ability and status Nutritional status Physical activity Advanced directives List of other physicians Hospitalizations, surgeries, and ER visits in previous 12 months Vitals Screenings to include cognitive, depression, and falls Referrals and  appointments  In addition, I have reviewed and discussed with patient certain preventive protocols, quality metrics, and best practice recommendations. A written personalized care plan for preventive services as well as general preventive health recommendations were provided to patient.     Sheral Flow, LPN   [id number]   Nurse Notes:  Patient is cogitatively intact. There were no vitals filed for this visit. There is no height or weight on file to calculate BMI. Patient stated that she has no issues with gait or balance; does not use any assistive devices.

## 2021-04-16 DIAGNOSIS — H04123 Dry eye syndrome of bilateral lacrimal glands: Secondary | ICD-10-CM | POA: Diagnosis not present

## 2021-04-16 DIAGNOSIS — H1045 Other chronic allergic conjunctivitis: Secondary | ICD-10-CM | POA: Diagnosis not present

## 2021-04-16 DIAGNOSIS — H25813 Combined forms of age-related cataract, bilateral: Secondary | ICD-10-CM | POA: Diagnosis not present

## 2021-04-18 DIAGNOSIS — I351 Nonrheumatic aortic (valve) insufficiency: Secondary | ICD-10-CM | POA: Insufficient documentation

## 2021-04-18 NOTE — Progress Notes (Signed)
Cardiology Office Note   Date:  04/19/2021   ID:  Hannah [name], DOB 01-04-1951, MRN [id number]  PCP:  [name], MD  Cardiologist:   Minus Breeding, MD   Chief Complaint  Patient presents with   Palpitations      History of Present Illness: Hannah [name] is a 70 y.o. female who presents for who presents for follow up of  PVD and a murmur.  She had mild sclerosis on her aortic valve with aortic insufficiency.    Since I last saw her she had lung cancer stage IV non-small cell.  She had metastasis to the brain.  She had resection and craniotomy.  She had robot-assisted right upper lobectomy.  She has been treated with carboplatinum, Alimta and Keytruda.  She had an echo with moderate AI.   She is only has 2 cardiac complaints.  Occasionally she has been getting some chest fluttering.  Her daughter thinks this is related to anxiety.  She says it skipping heartbeats that happen frequently but she has not had any presyncope or syncope.  She also has had some left calf pain when walking and she does go to the gym.  She otherwise denies shortness of breath, PND or orthopnea.  She had no palpitations, presyncope or syncope.  She has had no chest pain.  She has stopped smoking!   Past Medical History:  Diagnosis Date   Allergy    Anemia    Anxiety    Arthritis    Carpal tunnel syndrome    Constipation    senna C stool softeners help    Depression    Diverticulosis    Dyslipidemia    External hemorrhoids    GERD (gastroesophageal reflux disease)    Heart murmur    mild-moderate AR   Hiatal hernia    Hyperlipidemia    on meds    Hypertension    Internal hemorrhoids    lung ca with brain mets 07/2020   Osteoarthritis    Pre-diabetes    PVD (peripheral vascular disease) (HCC)    moderate carotid disease   RBBB    Smoker    Vocal cord polyps     Past Surgical History:  Procedure Laterality Date   ABDOMINAL HYSTERECTOMY  6629   APPLICATION OF CRANIAL NAVIGATION  N/A 08/24/2020   Procedure: APPLICATION OF CRANIAL NAVIGATION;  Surgeon: Judith Part, MD;  Location: [facility];  Service: Neurosurgery;  Laterality: N/A;   COLONOSCOPY     COLONOSCOPY W/ POLYPECTOMY  2009   CRANIOTOMY Left 08/24/2020   Procedure: Left Craniotomy for Tumor Resection with Brainlab;  Surgeon: Judith Part, MD;  Location: [facility];  Service: Neurosurgery;  Laterality: Left;   HEMORRHOID SURGERY     INTERCOSTAL NERVE BLOCK Right 09/24/2020   Procedure: INTERCOSTAL NERVE BLOCK;  Surgeon: Melrose Nakayama, MD;  Location: [facility];  Service: Thoracic;  Laterality: Right;   JOINT REPLACEMENT     LYMPH NODE DISSECTION Right 09/24/2020   Procedure: LYMPH NODE DISSECTION;  Surgeon: Melrose Nakayama, MD;  Location: [place];  Service: Thoracic;  Laterality: Right;   POLYPECTOMY     right total knee arthroplasty     Dr. Emeterio Reeve 06-04-18   [place]  08/16/2009   TOTAL KNEE ARTHROPLASTY Left 02/13/2014   Procedure: TOTAL KNEE ARTHROPLASTY;  Surgeon: Alta Corning, MD;  Location: [facility];  Service: Orthopedics;  Laterality: Left;   TOTAL KNEE ARTHROPLASTY Right 06/04/2018  Procedure: RIGHT TOTAL KNEE ARTHROPLASTY;  Surgeon: Dorna Leitz, MD;  Location: [facility];  Service: Orthopedics;  Laterality: Right;  Adductor Block   TUBAL LIGATION       Current Outpatient Medications  Medication Sig Dispense Refill   acetaminophen (TYLENOL) 500 MG tablet Take 2 tablets (1,000 mg total) by mouth every 6 (six) hours. 30 tablet 0   ALPRAZolam (XANAX XR) 1 MG 24 hr tablet 1  qhs 30 tablet 4   ALPRAZolam (XANAX) 0.5 MG tablet 2  qam   2  @2 ;00 120 tablet 5   Ascorbic Acid (VITAMIN C) 1000 MG tablet Take 1,000 mg by mouth daily.     aspirin 81 MG tablet Take 1 tablet (81 mg total) by mouth daily. Restart on 08/31/20 30 tablet    carboxymethylcellulose (REFRESH PLUS) 0.5 % SOLN Place 1-2 drops into both eyes daily.     citalopram (CELEXA) 10 MG tablet Take 1 tablet (10 mg total) by mouth daily. 30  tablet 1   folic acid (FOLVITE) 1 MG tablet Take 1 tablet by mouth once daily 30 tablet 0   loratadine (CLARITIN) 10 MG tablet Take 10 mg by mouth daily as needed for allergies.     mirtazapine (REMERON) 45 MG tablet Take 1 tablet (45 mg total) by mouth at bedtime. 30 tablet 3   omeprazole (PRILOSEC) 40 MG capsule TAKE 1 CAPSULE (40 MG TOTAL) BY MOUTH 2 (TWO) TIMES DAILY BEFORE A MEAL. 180 capsule 0   polyethylene glycol (MIRALAX / GLYCOLAX) 17 g packet Take 17 g by mouth 2 (two) times daily. 72 each 0   pravastatin (PRAVACHOL) 40 MG tablet TAKE 1 TABLET (40 MG TOTAL) BY MOUTH EVERY EVENING. 90 tablet 0   Probiotic Product (PROBIOTIC DAILY PO) Take 1 capsule by mouth daily.     prochlorperazine (COMPAZINE) 10 MG tablet Take 1 tablet (10 mg total) by mouth every 6 (six) hours as needed for nausea or vomiting. 30 tablet 0   senna-docusate (SENOKOT-S) 8.6-50 MG tablet Take 1 tablet by mouth 2 (two) times daily. (Patient taking differently: Take 1 tablet by mouth daily as needed for mild constipation.)     simethicone (MYLICON) 80 MG chewable tablet Chew 1 tablet (80 mg total) by mouth 4 (four) times daily as needed for flatulence (Bloating). 30 tablet 0   Wheat Dextrin (BENEFIBER) POWD Take 1 Scoop by mouth 2 (two) times daily as needed (constipation).     No current facility-administered medications for this visit.    Allergies:   Amlodipine, Chantix [varenicline tartrate], Clarithromycin, Lisinopril, Simvastatin, Wellbutrin [bupropion hcl], Lipitor [atorvastatin], and Sertraline    ROS:  Please see the history of present illness.   Otherwise, review of systems are positive for none.   All other systems are reviewed and negative.    PHYSICAL EXAM: VS:  BP 122/74   Pulse 83   Ht 5' (1.524 m)   Wt 113 lb (51.3 kg)   SpO2 98%   BMI 22.07 kg/m  , BMI Body mass index is 22.07 kg/m. GENERAL:  Well appearing NECK:  No jugular venous distention, waveform within normal limits, carotid upstroke  brisk and symmetric, no bruits, no thyromegaly LUNGS:  Clear to auscultation bilaterally CHEST:  Unremarkable HEART:  PMI not displaced or sustained,S1 and S2 within normal limits, no S3, no S4, no clicks, no rubs, 2 out of 6 brief apical systolic murmur radiating slightly at the aortic outflow tract, no diastolic murmurs ABD:  Flat, positive bowel sounds  normal in frequency in pitch, no bruits, no rebound, no guarding, no midline pulsatile mass, no hepatomegaly, no splenomegaly EXT:  2 plus pulses upper, absent dorsalis pedis and posterior tibialis bilateral lower, no edema, no cyanosis no clubbing   EKG:  EKG is  ordered today. The ekg ordered today demonstrates sinus rhythm, rate 85, right bundle branch block, left anterior fascicular block no change from previous.  No change from previous   Recent Labs: 08/19/2020: Magnesium 2.4 04/11/2021: ALT 13; BUN 10; Creatinine 0.67; Hemoglobin 10.4; Platelet Count 386; Potassium 3.7; Sodium 139; TSH 0.699    Lipid Panel    Component Value Date/Time   CHOL 171 03/20/2021 1402   TRIG 286.0 (H) 03/20/2021 1402   HDL 46.90 03/20/2021 1402   CHOLHDL 4 03/20/2021 1402   VLDL 57.2 (H) 03/20/2021 1402   LDLCALC 62 03/14/2020 1016   LDLDIRECT 73.0 03/20/2021 1402      Wt Readings from Last 3 Encounters:  04/19/21 113 lb (51.3 kg)  04/11/21 113 lb 4 oz (51.4 kg)  03/21/21 114 lb 12.8 oz (52.1 kg)      Other studies Reviewed: Additional studies/ records that were reviewed today include: Oncology records Review of the above records demonstrates:  Please see elsewhere in the note.     ASSESSMENT AND PLAN:  AS/AI:  This is mild to moderate on echo in March 2022.  I will follow this clinically and with repeat echo probably in a couple years aortic sclerosis and AI on echo in 2015.     RBBB/LAFB:     She has had no symptoms related to this.  In particular she has had no presyncope or syncope.  She is aware.   CAROTID STENOSIS:   She has 40 -  59% bilateral carotid stenosis in Sept. I will follow this in 1 year  DYSLIPIDEMIA:     LDL was excellent at 62.  No change in therapy.   DM: Her hemoglobin A1c is 6.0.    TOBACCO:   She stopped smoking  HYPERTENSION:  Her BP was down and she is now off of her antihypertensive.  No change in therapy.   CLAUDICATION: She does have some left calf discomfort with walking and reduced pulses and she will get ABIs   Current medicines are reviewed at length with the patient today.  The patient does not have concerns regarding medicines.  The following changes have been made:  As above  Labs/ tests ordered today include:   Orders Placed This Encounter  Procedures   EKG 12-Lead   VAS [place] ABI WITH/WO TBI   VAS [place] LOWER EXTREMITY ARTERIAL DUPLEX      Disposition:   FU with me in 12 months.     Signed, Minus Breeding, MD  04/19/2021 8:42 AM    [facility]

## 2021-04-19 ENCOUNTER — Ambulatory Visit (INDEPENDENT_AMBULATORY_CARE_PROVIDER_SITE_OTHER): Payer: Medicare HMO | Admitting: Cardiology

## 2021-04-19 ENCOUNTER — Encounter: Payer: Self-pay | Admitting: Cardiology

## 2021-04-19 ENCOUNTER — Other Ambulatory Visit: Payer: Self-pay | Admitting: Cardiology

## 2021-04-19 ENCOUNTER — Other Ambulatory Visit: Payer: Self-pay

## 2021-04-19 VITALS — BP 122/74 | HR 83 | Ht 60.0 in | Wt 113.0 lb

## 2021-04-19 DIAGNOSIS — I4510 Unspecified right bundle-branch block: Secondary | ICD-10-CM

## 2021-04-19 DIAGNOSIS — Z720 Tobacco use: Secondary | ICD-10-CM | POA: Diagnosis not present

## 2021-04-19 DIAGNOSIS — M79605 Pain in left leg: Secondary | ICD-10-CM | POA: Diagnosis not present

## 2021-04-19 DIAGNOSIS — I6523 Occlusion and stenosis of bilateral carotid arteries: Secondary | ICD-10-CM | POA: Diagnosis not present

## 2021-04-19 DIAGNOSIS — I739 Peripheral vascular disease, unspecified: Secondary | ICD-10-CM

## 2021-04-19 DIAGNOSIS — I10 Essential (primary) hypertension: Secondary | ICD-10-CM

## 2021-04-19 DIAGNOSIS — I351 Nonrheumatic aortic (valve) insufficiency: Secondary | ICD-10-CM | POA: Diagnosis not present

## 2021-04-19 DIAGNOSIS — E785 Hyperlipidemia, unspecified: Secondary | ICD-10-CM | POA: Diagnosis not present

## 2021-04-19 NOTE — Patient Instructions (Addendum)
Medication Instructions:  Your physician recommends that you continue on your current medications as directed. Please refer to the Current Medication list given to you today.   *If you need a refill on your cardiac medications before your next appointment, please call your pharmacy*  Lab Work: NONE   Testing/Procedures: Your physician has requested that you have an ankle brachial index (ABI). During this test an ultrasound and blood pressure cuff are used to evaluate the arteries that supply the arms and legs with blood. Allow thirty minutes for this exam. There are no restrictions or special instructions.  Your physician has requested that you have a lower or upper extremity arterial duplex. This test is an ultrasound of the arteries in the legs or arms. It looks at arterial blood flow in the legs and arms. Allow one hour for Lower and Upper Arterial scans. There are no restrictions or special instructions  Your physician has requested that you have a carotid duplex. This test is an ultrasound of the carotid arteries in your neck. It looks at blood flow through these arteries that supply the brain with blood. Allow one hour for this exam. There are no restrictions or special instructions. September 2023  Follow-Up: At [facility], you and your health needs are our priority.  As part of our continuing mission to provide you with exceptional heart care, we have created designated Provider Care Teams.  These Care Teams include your primary Cardiologist (physician) and Advanced Practice Providers (APPs -  Physician Assistants and Nurse Practitioners) who all work together to provide you with the care you need, when you need it.  We recommend signing up for the patient portal called "MyChart".  Sign up information is provided on this After Visit Summary.  MyChart is used to connect with patients for Virtual Visits (Telemedicine).  Patients are able to view lab/test results, encounter notes,  upcoming appointments, etc.  Non-urgent messages can be sent to your provider as well.   To learn more about what you can do with MyChart, go to [email or web address].    Your next appointment:   12 month(s)  The format for your next appointment:   In Person  Provider:   You may see Minus Breeding, MD or one of the following Advanced Practice Providers on your designated Care Team:   Rosaria Ferries, PA-C Caron Presume, PA-C Jory Sims, DNP, ANP

## 2021-04-21 ENCOUNTER — Encounter: Payer: Self-pay | Admitting: Internal Medicine

## 2021-04-22 ENCOUNTER — Emergency Department (HOSPITAL_BASED_OUTPATIENT_CLINIC_OR_DEPARTMENT_OTHER): Payer: Medicare HMO

## 2021-04-22 ENCOUNTER — Encounter (HOSPITAL_BASED_OUTPATIENT_CLINIC_OR_DEPARTMENT_OTHER): Payer: Self-pay

## 2021-04-22 ENCOUNTER — Encounter: Payer: Self-pay | Admitting: Internal Medicine

## 2021-04-22 ENCOUNTER — Inpatient Hospital Stay (HOSPITAL_BASED_OUTPATIENT_CLINIC_OR_DEPARTMENT_OTHER)
Admission: EM | Admit: 2021-04-22 | Discharge: 2021-04-28 | DRG: 054 | Disposition: A | Discharge status: Home / Self Care | Payer: Medicare HMO | Attending: Internal Medicine | Admitting: Internal Medicine

## 2021-04-22 ENCOUNTER — Other Ambulatory Visit: Payer: Self-pay

## 2021-04-22 DIAGNOSIS — Z9221 Personal history of antineoplastic chemotherapy: Secondary | ICD-10-CM | POA: Diagnosis not present

## 2021-04-22 DIAGNOSIS — C7931 Secondary malignant neoplasm of brain: Principal | ICD-10-CM | POA: Diagnosis present

## 2021-04-22 DIAGNOSIS — R4701 Aphasia: Secondary | ICD-10-CM | POA: Diagnosis present

## 2021-04-22 DIAGNOSIS — Z833 Family history of diabetes mellitus: Secondary | ICD-10-CM

## 2021-04-22 DIAGNOSIS — Z803 Family history of malignant neoplasm of breast: Secondary | ICD-10-CM

## 2021-04-22 DIAGNOSIS — Z888 Allergy status to other drugs, medicaments and biological substances status: Secondary | ICD-10-CM | POA: Diagnosis not present

## 2021-04-22 DIAGNOSIS — R410 Disorientation, unspecified: Secondary | ICD-10-CM

## 2021-04-22 DIAGNOSIS — M1990 Unspecified osteoarthritis, unspecified site: Secondary | ICD-10-CM | POA: Diagnosis present

## 2021-04-22 DIAGNOSIS — G936 Cerebral edema: Secondary | ICD-10-CM | POA: Diagnosis present

## 2021-04-22 DIAGNOSIS — R569 Unspecified convulsions: Secondary | ICD-10-CM

## 2021-04-22 DIAGNOSIS — G9340 Encephalopathy, unspecified: Secondary | ICD-10-CM | POA: Diagnosis present

## 2021-04-22 DIAGNOSIS — Z923 Personal history of irradiation: Secondary | ICD-10-CM | POA: Diagnosis not present

## 2021-04-22 DIAGNOSIS — E1169 Type 2 diabetes mellitus with other specified complication: Secondary | ICD-10-CM | POA: Diagnosis present

## 2021-04-22 DIAGNOSIS — Z79899 Other long term (current) drug therapy: Secondary | ICD-10-CM | POA: Diagnosis not present

## 2021-04-22 DIAGNOSIS — G9341 Metabolic encephalopathy: Secondary | ICD-10-CM | POA: Diagnosis present

## 2021-04-22 DIAGNOSIS — R000 Tachycardia, unspecified: Secondary | ICD-10-CM | POA: Diagnosis not present

## 2021-04-22 DIAGNOSIS — I700 Atherosclerosis of aorta: Secondary | ICD-10-CM | POA: Diagnosis present

## 2021-04-22 DIAGNOSIS — R5081 Fever presenting with conditions classified elsewhere: Secondary | ICD-10-CM | POA: Diagnosis present

## 2021-04-22 DIAGNOSIS — Z85118 Personal history of other malignant neoplasm of bronchus and lung: Secondary | ICD-10-CM

## 2021-04-22 DIAGNOSIS — Z8042 Family history of malignant neoplasm of prostate: Secondary | ICD-10-CM

## 2021-04-22 DIAGNOSIS — E782 Mixed hyperlipidemia: Secondary | ICD-10-CM | POA: Diagnosis present

## 2021-04-22 DIAGNOSIS — Z20822 Contact with and (suspected) exposure to covid-19: Secondary | ICD-10-CM | POA: Diagnosis present

## 2021-04-22 DIAGNOSIS — Z9889 Other specified postprocedural states: Secondary | ICD-10-CM | POA: Diagnosis not present

## 2021-04-22 DIAGNOSIS — E119 Type 2 diabetes mellitus without complications: Secondary | ICD-10-CM

## 2021-04-22 DIAGNOSIS — J90 Pleural effusion, not elsewhere classified: Secondary | ICD-10-CM | POA: Diagnosis not present

## 2021-04-22 DIAGNOSIS — Z96653 Presence of artificial knee joint, bilateral: Secondary | ICD-10-CM | POA: Diagnosis present

## 2021-04-22 DIAGNOSIS — D709 Neutropenia, unspecified: Secondary | ICD-10-CM | POA: Diagnosis present

## 2021-04-22 DIAGNOSIS — R531 Weakness: Secondary | ICD-10-CM | POA: Diagnosis not present

## 2021-04-22 DIAGNOSIS — I10 Essential (primary) hypertension: Secondary | ICD-10-CM | POA: Diagnosis not present

## 2021-04-22 DIAGNOSIS — C3411 Malignant neoplasm of upper lobe, right bronchus or lung: Secondary | ICD-10-CM | POA: Diagnosis present

## 2021-04-22 DIAGNOSIS — Z7982 Long term (current) use of aspirin: Secondary | ICD-10-CM | POA: Diagnosis not present

## 2021-04-22 DIAGNOSIS — Z823 Family history of stroke: Secondary | ICD-10-CM | POA: Diagnosis not present

## 2021-04-22 DIAGNOSIS — Z87891 Personal history of nicotine dependence: Secondary | ICD-10-CM | POA: Diagnosis not present

## 2021-04-22 DIAGNOSIS — W19XXXA Unspecified fall, initial encounter: Secondary | ICD-10-CM

## 2021-04-22 DIAGNOSIS — C3490 Malignant neoplasm of unspecified part of unspecified bronchus or lung: Secondary | ICD-10-CM | POA: Diagnosis present

## 2021-04-22 DIAGNOSIS — R4182 Altered mental status, unspecified: Secondary | ICD-10-CM | POA: Diagnosis not present

## 2021-04-22 LAB — CBC
HCT: 29.7 % — ABNORMAL LOW (ref 36.0–46.0)
Hemoglobin: 9.2 g/dL — ABNORMAL LOW (ref 12.0–15.0)
MCH: 27.5 pg (ref 26.0–34.0)
MCHC: 31 g/dL (ref 30.0–36.0)
MCV: 88.7 fL (ref 80.0–100.0)
Platelets: 206 10*3/uL (ref 150–400)
RBC: 3.35 MIL/uL — ABNORMAL LOW (ref 3.87–5.11)
RDW: 13.5 % (ref 11.5–15.5)
WBC: 3.1 10*3/uL — ABNORMAL LOW (ref 4.0–10.5)
nRBC: 0 % (ref 0.0–0.2)

## 2021-04-22 LAB — URINALYSIS, ROUTINE W REFLEX MICROSCOPIC
Bilirubin Urine: NEGATIVE
Glucose, UA: NEGATIVE mg/dL
Hgb urine dipstick: NEGATIVE
Ketones, ur: NEGATIVE mg/dL
Nitrite: NEGATIVE
Protein, ur: NEGATIVE mg/dL
Specific Gravity, Urine: 1.018 (ref 1.005–1.030)
pH: 5.5 (ref 5.0–8.0)

## 2021-04-22 LAB — CBG MONITORING, ED
Glucose-Capillary: 107 mg/dL — ABNORMAL HIGH (ref 70–99)
Glucose-Capillary: 56 mg/dL — ABNORMAL LOW (ref 70–99)
Glucose-Capillary: 105 mg/dL — ABNORMAL HIGH (ref 70–99)

## 2021-04-22 LAB — BASIC METABOLIC PANEL
Anion gap: 8 (ref 5–15)
BUN: 11 mg/dL (ref 8–23)
CO2: 28 mmol/L (ref 22–32)
Calcium: 9.7 mg/dL (ref 8.9–10.3)
Chloride: 100 mmol/L (ref 98–111)
Creatinine, Ser: 0.63 mg/dL (ref 0.44–1.00)
GFR, Estimated: >60 mL/min (ref >60)
Glucose, Bld: 108 mg/dL — ABNORMAL HIGH (ref 70–99)
Potassium: 4.2 mmol/L (ref 3.5–5.1)
Sodium: 136 mmol/L (ref 135–145)

## 2021-04-22 LAB — RESP PANEL BY RT-PCR (FLU A&B, COVID) ARPGX2
Influenza A by PCR: NEGATIVE
Influenza B by PCR: NEGATIVE
SARS Coronavirus 2 by RT PCR: NEGATIVE

## 2021-04-22 MED ORDER — SODIUM CHLORIDE 0.9 % IV SOLN
2.0000 g | Freq: Once | INTRAVENOUS | Status: AC
  Administered 2021-04-22: 2 g via INTRAVENOUS
  Filled 2021-04-22: qty 20

## 2021-04-22 MED ORDER — LABETALOL HCL 5 MG/ML IV SOLN: 5.0000 mg | INTRAVENOUS | Status: DC | PRN

## 2021-04-22 MED ORDER — DEXAMETHASONE SODIUM PHOSPHATE 10 MG/ML IJ SOLN: 10.0000 mg | INTRAMUSCULAR | Status: DC

## 2021-04-22 MED ORDER — LEVETIRACETAM IN NACL 1000 MG/100ML IV SOLN
1000.0000 mg | Freq: Two times a day (BID) | INTRAVENOUS | Status: DC
Start: 2021-04-22 — End: 2021-04-24
  Administered 2021-04-22 – 2021-04-24 (×4): 1000 mg via INTRAVENOUS
  Filled 2021-04-22 (×4): qty 100

## 2021-04-22 MED ORDER — VANCOMYCIN HCL IN DEXTROSE 1-5 GM/200ML-% IV SOLN
1000.0000 mg | Freq: Once | INTRAVENOUS | Status: AC
  Administered 2021-04-22: 1000 mg via INTRAVENOUS
  Filled 2021-04-22: qty 200

## 2021-04-22 MED ORDER — VANCOMYCIN HCL 500 MG/100ML IV SOLN
500.0000 mg | Freq: Two times a day (BID) | INTRAVENOUS | Status: DC
  Administered 2021-04-23: 500 mg via INTRAVENOUS
  Filled 2021-04-22 (×3): qty 100

## 2021-04-22 MED ORDER — DEXAMETHASONE SODIUM PHOSPHATE 10 MG/ML IJ SOLN
10.0000 mg | Freq: Once | INTRAMUSCULAR | Status: AC
  Administered 2021-04-22: 10 mg via INTRAVENOUS
  Filled 2021-04-22: qty 1

## 2021-04-22 MED ORDER — SODIUM CHLORIDE 0.9 % IV SOLN: 2.0000 g | INTRAVENOUS | Status: DC

## 2021-04-22 NOTE — ED Provider Notes (Signed)
[facility] EMERGENCY DEPT Provider Note   CSN: [id number] Arrival date & time: 04/22/21  1542     History Chief Complaint  Patient presents with   Weakness    Hannah [name] is a 70 y.o. female presenting with history of non-small cell lung carcinoma with brain metastases, presenting to [facility] apartment for confusion.  Patient is here with daughter provides supplemental history.  Her daughter reports the patient was doing well yesterday, was lucid and coherent.  Today she appeared much more confused.  Since her arrival in the ED the family's expressed concern that the patient speech has worsened, now she is having difficulty getting words out.  The patient herself can answer very simple questions.  It is unclear if she is having a headache.  She denies any neck pain.  Her daughter reports patient had similar symptoms when she was initially diagnosed with her brain metastasis.  She has had a craniectomy in the past.  Her daughter reports that the patient is on active chemo cycles at this time.  HPI     Past Medical History:  Diagnosis Date   Allergy    Anemia    Anxiety    Arthritis    Carpal tunnel syndrome    Constipation    senna C stool softeners help    Depression    Diverticulosis    Dyslipidemia    External hemorrhoids    GERD (gastroesophageal reflux disease)    Heart murmur    mild-moderate AR   Hiatal hernia    Hyperlipidemia    on meds    Hypertension    Internal hemorrhoids    lung ca with brain mets 07/2020   Osteoarthritis    Pre-diabetes    PVD (peripheral vascular disease) (HCC)    moderate carotid disease   RBBB    Smoker    Vocal cord polyps     Patient Active Problem List   Diagnosis Date Noted   Acute encephalopathy 04/22/2021   Nonrheumatic aortic valve insufficiency 04/18/2021   Sinusitis 02/21/2021   Allergic rhinitis 02/21/2021   Encounter for antineoplastic chemotherapy 10/23/2020   Encounter for antineoplastic  immunotherapy 10/23/2020   Anemia 10/19/2020   Sleep difficulties 10/19/2020   S/P robot-assisted surgical procedure 09/24/2020   Aortic atherosclerosis ([place]) 09/12/2020   Lung mass 09/04/2020   Status post craniotomy 08/24/2020   Brain tumor ([place]) 08/24/2020   Hematochezia 08/19/2020   Acute blood loss anemia    Rectal bleeding 08/17/2020   Primary cancer of right upper lobe of lung ([place]) 08/07/2020   Brain metastasis ([place]) 08/06/2020   Memory changes 07/24/2020   Non-recurrent unilateral inguinal hernia without obstruction or gangrene 07/24/2020   Pain of right clavicle 09/12/2019   Nonspecific abnormal electrocardiogram (ECG) (EKG) 10/20/2018   Educated about COVID-19 virus infection 10/20/2018   Depression- Dr Casimiro Needle 07/17/2018   Primary osteoarthritis of right knee 06/04/2018   Bilateral carotid artery stenosis 09/24/2017   Aortic valve sclerosis 09/24/2017   Vocal cord polyps 09/07/2017   Dysphagia 09/07/2017   Diabetes mellitus without complication ([place]) 07/62/2633   GERD (gastroesophageal reflux disease) 02/27/2016   Anxiety - Dr Casimiro Needle 02/27/2016   S/P total knee replacement 02/13/2014   RBBB 02/08/2014   HTN (hypertension) 02/08/2014   PVD - bilateral 60-79% carotid strenosis 02/08/2014   Mixed hyperlipidemia 11/29/2013   Carpal tunnel syndrome, bilateral 11/23/2013   Murmur- mild -mod AR, mild MR 11/10/2013   Constipation 12/16/2012    Past Surgical History:  Procedure Laterality Date   ABDOMINAL HYSTERECTOMY  0240   APPLICATION OF CRANIAL NAVIGATION N/A 08/24/2020   Procedure: APPLICATION OF CRANIAL NAVIGATION;  Surgeon: Judith Part, MD;  Location: [place];  Service: Neurosurgery;  Laterality: N/A;   COLONOSCOPY     COLONOSCOPY W/ POLYPECTOMY  2009   CRANIOTOMY Left 08/24/2020   Procedure: Left Craniotomy for Tumor Resection with Brainlab;  Surgeon: Judith Part, MD;  Location: [place];  Service: Neurosurgery;  Laterality: Left;   HEMORRHOID  SURGERY     INTERCOSTAL NERVE BLOCK Right 09/24/2020   Procedure: INTERCOSTAL NERVE BLOCK;  Surgeon: Melrose Nakayama, MD;  Location: [place];  Service: Thoracic;  Laterality: Right;   JOINT REPLACEMENT     LYMPH NODE DISSECTION Right 09/24/2020   Procedure: LYMPH NODE DISSECTION;  Surgeon: Melrose Nakayama, MD;  Location: [place];  Service: Thoracic;  Laterality: Right;   POLYPECTOMY     right total knee arthroplasty     Dr. Emeterio Reeve 06-04-18   [place]  08/16/2009   TOTAL KNEE ARTHROPLASTY Left 02/13/2014   Procedure: TOTAL KNEE ARTHROPLASTY;  Surgeon: Alta Corning, MD;  Location: [place];  Service: Orthopedics;  Laterality: Left;   TOTAL KNEE ARTHROPLASTY Right 06/04/2018   Procedure: RIGHT TOTAL KNEE ARTHROPLASTY;  Surgeon: Dorna Leitz, MD;  Location: [facility];  Service: Orthopedics;  Laterality: Right;  Adductor Block   TUBAL LIGATION       OB History   No obstetric history on file.     Family History  Problem Relation Age of Onset   Cancer Father    Prostate cancer Father    Diabetes Sister    Cancer Sister    Stroke Maternal Grandfather    Colitis Maternal Aunt    Breast cancer Maternal Aunt    Colon cancer Neg Hx    Colon polyps Neg Hx    Rectal cancer Neg Hx    Stomach cancer Neg Hx    Esophageal cancer Neg Hx     Social History   Tobacco Use   Smoking status: Former    Packs/day: 0.25    Years: 40.00    Pack years: 10.00    Types: Cigarettes    Quit date: 07/09/2020    Years since quitting: 0.7    Passive exposure: Never   Smokeless tobacco: Never   Tobacco comments:    PACK WILL LAST 3 days  Vaping Use   Vaping Use: Never used  Substance Use Topics   Alcohol use: No   Drug use: No    Home Medications Prior to Admission medications   Medication Sig Start Date End Date Taking? Authorizing Provider  acetaminophen (TYLENOL) 500 MG tablet Take 2 tablets (1,000 mg total) by mouth every 6 (six) hours. 09/28/20   Elgie Collard, PA-C  ALPRAZolam (XANAX  XR) 1 MG 24 hr tablet 1  qhs 03/26/21   Plovsky, Berneta Sages, MD  ALPRAZolam Duanne Moron) 0.5 MG tablet 2  qam   2  @2 ;00 03/26/21   Plovsky, Berneta Sages, MD  Ascorbic Acid (VITAMIN C) 1000 MG tablet Take 1,000 mg by mouth daily.    [provider]  aspirin 81 MG tablet Take 1 tablet (81 mg total) by mouth daily. Restart on 08/31/20 08/25/20   Judith Part, MD  carboxymethylcellulose (REFRESH PLUS) 0.5 % SOLN Place 1-2 drops into both eyes daily.    [provider]  citalopram (CELEXA) 10 MG tablet Take 1 tablet (10 mg total) by  mouth daily. 03/26/21 03/26/22  Plovsky, Berneta Sages, MD  folic acid (FOLVITE) 1 MG tablet Take 1 tablet by mouth once daily 04/03/21   Curt Bears, MD  loratadine (CLARITIN) 10 MG tablet Take 10 mg by mouth daily as needed for allergies.    [provider]  mirtazapine (REMERON) 45 MG tablet Take 1 tablet (45 mg total) by mouth at bedtime. 03/26/21   Plovsky, Berneta Sages, MD  omeprazole (PRILOSEC) 40 MG capsule TAKE 1 CAPSULE (40 MG TOTAL) BY MOUTH 2 (TWO) TIMES DAILY BEFORE A MEAL. 01/31/21   Burns, Claudina Lick, MD  polyethylene glycol (MIRALAX / GLYCOLAX) 17 g packet Take 17 g by mouth 2 (two) times daily. 08/19/20   Eugenie Filler, MD  pravastatin (PRAVACHOL) 40 MG tablet TAKE 1 TABLET (40 MG TOTAL) BY MOUTH EVERY EVENING. 01/31/21   [name], MD  Probiotic Product (PROBIOTIC DAILY PO) Take 1 capsule by mouth daily.    [provider]  prochlorperazine (COMPAZINE) 10 MG tablet Take 1 tablet (10 mg total) by mouth every 6 (six) hours as needed for nausea or vomiting. 10/23/20   Curt Bears, MD  senna-docusate (SENOKOT-S) 8.6-50 MG tablet Take 1 tablet by mouth 2 (two) times daily. Patient taking differently: Take 1 tablet by mouth daily as needed for mild constipation. 08/19/20   Eugenie Filler, MD  simethicone (MYLICON) 80 MG chewable tablet Chew 1 tablet (80 mg total) by mouth 4 (four) times daily as needed for flatulence (Bloating). 09/28/20   Elgie Collard, PA-C  Wheat Dextrin (BENEFIBER) POWD Take 1 Scoop by mouth 2 (two) times daily as needed (constipation).    [provider]    Allergies    Amlodipine, Chantix [varenicline tartrate], Clarithromycin, Lisinopril, Simvastatin, Wellbutrin [bupropion hcl], Lipitor [atorvastatin], and Sertraline  Review of Systems   Review of Systems  Unable to perform ROS: Mental status change (level 5 caveat)   Physical Exam Updated Vital Signs BP (!) 148/86   Pulse 87   Temp 98.7 F (37.1 C) (Oral)   Resp (!) 21   Ht 5' (1.524 m)   Wt 51.3 kg   SpO2 100%   BMI 22.07 kg/m   Physical Exam Constitutional:      Comments: Appears confused, mumbling  HENT:     Head: Normocephalic and atraumatic.  Eyes:     Conjunctiva/sclera: Conjunctivae normal.     Pupils: Pupils are equal, round, and reactive to light.  Cardiovascular:     Rate and Rhythm: Normal rate and regular rhythm.  Pulmonary:     Effort: Pulmonary effort is normal. No respiratory distress.  Abdominal:     General: There is no distension.     Tenderness: There is no abdominal tenderness.  Skin:    General: Skin is warm and dry.  Neurological:     General: No focal deficit present.     Mental Status: She is alert.     GCS: GCS eye subscore is 4. GCS verbal subscore is 3. GCS motor subscore is 6.     Comments: Expressive aphasia EOM intact Moving all extremities on command, normal strength    ED Results / Procedures / Treatments   Labs (all labs ordered are listed, but only abnormal results are displayed) Labs Reviewed  BASIC METABOLIC PANEL - Abnormal; Notable for the following components:      Result Value   Glucose, Bld 108 (*)    All other components within normal limits  CBC - Abnormal; Notable  for the following components:   WBC 3.1 (*)    RBC 3.35 (*)    Hemoglobin 9.2 (*)    HCT 29.7 (*)    All other components within normal limits  URINALYSIS, ROUTINE W REFLEX MICROSCOPIC - Abnormal; Notable  for the following components:   Leukocytes,Ua TRACE (*)    Bacteria, UA MANY (*)    All other components within normal limits  CBG MONITORING, ED - Abnormal; Notable for the following components:   Glucose-Capillary 56 (*)    All other components within normal limits  CBG MONITORING, ED - Abnormal; Notable for the following components:   Glucose-Capillary 107 (*)    All other components within normal limits  CBG MONITORING, ED - Abnormal; Notable for the following components:   Glucose-Capillary 105 (*)    All other components within normal limits  RESP PANEL BY RT-PCR (FLU A&B, COVID) [id number]    EKG None  Radiology CT Head Wo Contrast  Result Date: 04/22/2021 CLINICAL DATA:  Initial evaluation for acute mental status change, history of metastatic lung carcinoma with brain involvement. Status post chemotherapy. EXAM: CT HEAD WITHOUT CONTRAST TECHNIQUE: Contiguous axial images were obtained from the base of the skull through the vertex without intravenous contrast. COMPARISON:  Previous CT from 02/13/2021 and brain MRI from 02/18/2021. FINDINGS: Brain: Postoperative changes from previous left frontal craniotomy are seen. Vasogenic edema involving the anterior left frontal lobe about the surgical resection site has progressed and worsened from prior exam, concerning for progressive disease. Additionally, there is progressive vasogenic edema at the left parieto-occipital region, presumably related to a previously identified subcentimeter metastatic implant at this location, also concerning for progressive disease. No other significant vasogenic edema. No significant midline shift. No hydrocephalus or ventricular trapping. Basilar cisterns remain patent. No definite new lesions evident by CT. No acute intracranial hemorrhage. No other visible acute large vessel territory infarct. No extra-axial fluid collection. Vascular: No hyperdense vessel. Skull: Prior left frontal craniotomy. Calvarium  otherwise intact. No scalp soft tissue abnormality. Sinuses/Orbits: Globes and orbital soft tissues demonstrate no acute finding. Left sphenoid sinus retention cyst noted. Paranasal sinuses and mastoid air cells are otherwise clear. Other: None. IMPRESSION: 1. Postoperative changes from previous left frontal craniotomy. Progressive vasogenic edema involving the anterior left frontal lobe about the surgical resection site, concerning for progressive disease. If the patient has undergone recent XRT, possible post radiation changes could also be considered. 2. Progressive vasogenic edema at the left parieto-occipital region, presumably related to a previously identified subcentimeter metastatic implant at this location, also concerning for progressive disease versus post XRT changes. 3. No other new acute intracranial abnormality. Electronically Signed   By: Jeannine Boga M.D.   On: 04/22/2021 21:02   DG Chest Portable 1 View  Result Date: 04/22/2021 CLINICAL DATA:  Evaluate for infection. EXAM: PORTABLE CHEST 1 VIEW COMPARISON:  Chest radiograph dated 02/12/2021 and CT dated 03/14/2021. FINDINGS: Postsurgical changes of the right lung with persistent tenting of the right hemidiaphragm and small right pleural effusion similar to prior exams. The left lung is clear. No pneumothorax. Stable cardiac silhouette. No acute osseous pathology. IMPRESSION: Stable postsurgical changes of the right lung with small right pleural effusion. No new consolidation. Electronically Signed   By: Anner Crete M.D.   On: 04/22/2021 20:44    Procedures Procedures   Medications Ordered in ED Medications  vancomycin (VANCOCIN) IVPB 1000 mg/200 mL premix (1,000 mg Intravenous New Bag/Given 04/22/21 2126)  vancomycin (VANCOREADY) IVPB 500 mg/100 mL (  has no administration in time range)  cefTRIAXone (ROCEPHIN) 2 g in sodium chloride 0.9 % 100 mL IVPB (0 g Intravenous Stopped 04/22/21 2123)  dexamethasone (DECADRON)  injection 10 mg (10 mg Intravenous Given 04/22/21 2048)    ED Course  I have reviewed the triage vital signs and the nursing notes.  Pertinent labs & imaging results that were available during my care of the patient were reviewed by me and considered in my medical decision making (see chart for details).  Differential diagnosis for confusion, aphasia would include CVA versus cerebral edema (brain mets) vs infection vs other  CT scan of the head reviewed showing concern for brain metastasis with cerebral edema.  Neurology was consulted by phone to evaluate the patient upon her arrival at [facility].  No acute stroke noted on CT scan, and these findings appear more consistent with cerebral edema than CVA.   Patient did have a fever initially on arrival, afebrile on recheck.  However because of this confusion and possible fever, with mild leukopenia, I felt it would be reasonable to treat the patient empirically for bacterial meningitis with rocephin and vancomycin.  Patient was also given IV Decadron for cerebral edema and swelling.  LP contraindicated with brain mets.  I personally reviewed and interpreted her labs.  Hemoglobin her baseline.  COVID and flu are negative.  Glucose within normal limits.  No clear evidence of infection on UA or chest x-ray.  I also reviewed the patient's prior medical records.  Supplemental history is provided by her daughter.  I spoke to Dr Myna Hidalgo hospitalist who asks that the ED team contacts him after they've reassessed the patient's stability on arrival.  He will place progressive bed request.  Clinical Course as of 04/22/21 2143  Mon Apr 22, 2021  2046 Spoke to neuroradiologist who does not see evidence of acute infarct but does note significant edema in the brain likely involving the mass.  This may be the cause of the patient's symptoms.  I have paged neurology, anticipating transfer to [facility] for MRI [MT]  2106 Speaking to [facility] for ed to ed transfer to  [facility], accepting EDP Dr Glynda Jaeger while awaiting hospitalist callback  [MT]  2106 Spoke to Dr Leonel Ramsay who will consult on patient upon her arrival (neurology) and agreed with decadron and empiric antibiotics here in the ED [MT]  2116 Family and pt updated, transport reportedly en route [MT]  2137 I spoke to Dr Myna Hidalgo hospitalist who asks that the ED team contacts him after they've reassessed the patient's stability on arrival.  He will place progressive bed request. [MT]    Clinical Course User Index [MT] Goerge Mohr, Carola Rhine, MD    Final Clinical Impression(s) / ED Diagnoses Final diagnoses:  Confusion  Cerebral edema [facility])    Rx / DC Orders ED Discharge Orders     None        Wyvonnia Dusky, MD 04/22/21 2143

## 2021-04-22 NOTE — ED Notes (Signed)
Handoff report given to [facility].  Handoff report given to [facility]

## 2021-04-22 NOTE — ED Triage Notes (Signed)
Pt is in maintenance stage of Lung CA to metastasis to brain, last dose of chemo on Thu 04/11/21. Dau at bedside states pt weaker, with "more loss of words," and c/o RUE pain.

## 2021-04-22 NOTE — ED Notes (Signed)
Pt to ED via [facility] transfer from [place], HX lung cancer mets to the brain concern for cerebral edema.  Vancomycin currently infusing. aphasic , but able to follow commands .  Last VS: 171/106, 100%ra, pulse 80,

## 2021-04-22 NOTE — ED Notes (Signed)
MD made aware of Patients Condition at this Time.

## 2021-04-22 NOTE — ED Notes (Signed)
[facility] at bedside 

## 2021-04-22 NOTE — H&P (Signed)
History and Physical    Hannah [name] NIO:[id number] DOB: Jul 13, 1950 DOA: 04/22/2021  PCP: [name], MD  Patient coming from: [facility]  I have personally briefly reviewed patient's old medical records in [place]  Chief Complaint: Aphasia  HPI: Hannah [name] is a 70 y.o. 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  Patient presented to outside ED this evening with concerns of acute onset of aphasia.  Daughter at bedside provides most of the history.  She reports that for the past several days she has had increasing difficulty with word finding and stuttering.  Today she was almost nonverbal.  Also has noted increasing right upper extremity weakness and required more assistance from her husband to put on close.  Has new right foot jerking for the past few days as well. Has sharp frontal headache but no changes in vision. No fever or chills. No abdominal pain, dysuria, urgency or frequency.  CT head shows progressive brain metastatic disease with vasogenic edema but no midline shift and started on IV Decadron.  She also initially presented with a fever with mild leukopenia concerning for bacterial meningitis and was started on empiric Rocephin and vancomycin.  LP contraindicated with brain metastasis. Neurology was consulted and recommended transfer here to [facility].  ED to ED transfer was initiated by our hospitalist team to enable stability of patient prior to admission. Pt had episode of witnessed seizure at [facility] ED and by RN on arrival to ED  at [facility] and started on Keppra. Pt reports she was consciousness and aware of her seizures.   CBC shows leukopenia with WBC of 3.1 similar to prior.  Hemoglobin 9.2 over her baseline.  Platelet of 206.  No differential obtained with CBC. Sodium of 136, potassium of 4.2, creatinine of 0.63, BG of 108  Negative COVID/flu PCR UA shows trace leukocyte, negative nitrite, many bacteria, 11-20 WBCs  Daughter has noted drastic improvement in her speech since Decadron has been given.   Review of Systems: Unable to obtain for ROS given patient has some speech difficulties  Past Medical History:  Diagnosis Date   Allergy    Anemia    Anxiety    Arthritis    Carpal tunnel syndrome    Constipation    senna C stool softeners help    Depression    Diverticulosis    Dyslipidemia    External hemorrhoids    GERD (gastroesophageal reflux disease)    Heart murmur    mild-moderate AR   Hiatal hernia    Hyperlipidemia    on meds    Hypertension    Internal hemorrhoids    lung ca with brain mets 07/2020   Osteoarthritis    Pre-diabetes    PVD (peripheral vascular disease) (HCC)    moderate carotid disease   RBBB    Smoker    Vocal cord polyps     Past Surgical History:  Procedure Laterality Date   ABDOMINAL HYSTERECTOMY  8182   APPLICATION OF CRANIAL NAVIGATION N/A 08/24/2020   Procedure: APPLICATION OF CRANIAL NAVIGATION;  Surgeon: Judith Part, MD;  Location:  [facility];  Service: Neurosurgery;  Laterality: N/A;   COLONOSCOPY     COLONOSCOPY W/ POLYPECTOMY  2009   CRANIOTOMY Left 08/24/2020   Procedure: Left Craniotomy for Tumor Resection with Brainlab;  Surgeon: Judith Part, MD;  Location: [place];  Service: Neurosurgery;  Laterality: Left;   HEMORRHOID SURGERY     INTERCOSTAL NERVE BLOCK Right 09/24/2020   Procedure: INTERCOSTAL NERVE BLOCK;  Surgeon: Melrose Nakayama, MD;  Location: [place];  Service: Thoracic;  Laterality: Right;   JOINT REPLACEMENT     LYMPH NODE  DISSECTION Right 09/24/2020   Procedure: LYMPH NODE DISSECTION;  Surgeon: Melrose Nakayama, MD;  Location: [place];  Service: Thoracic;  Laterality: Right;   POLYPECTOMY     right total knee arthroplasty     Dr. Emeterio Reeve 06-04-18   [place]  08/16/2009   TOTAL KNEE ARTHROPLASTY Left 02/13/2014   Procedure: TOTAL KNEE ARTHROPLASTY;  Surgeon: Alta Corning, MD;  Location: [place];  Service: Orthopedics;  Laterality: Left;   TOTAL KNEE ARTHROPLASTY Right 06/04/2018   Procedure: RIGHT TOTAL KNEE ARTHROPLASTY;  Surgeon: Dorna Leitz, MD;  Location: [facility];  Service: Orthopedics;  Laterality: Right;  Adductor Block   TUBAL LIGATION       reports that she quit smoking about 9 months ago. Her smoking use included cigarettes. She has a 10.00 pack-year smoking history. She has never been exposed to tobacco smoke. She has never used smokeless tobacco. She reports that she does not drink alcohol and does not use drugs. Social History  Allergies  Allergen Reactions   Amlodipine Swelling and Rash    Rash, swelling   Chantix [Varenicline Tartrate] Shortness Of Breath, Swelling and Other (See Comments)    Tongue swell,sob   Clarithromycin Rash   Lisinopril Hives   Simvastatin Hives   Wellbutrin [Bupropion Hcl] Hives   Lipitor [Atorvastatin]     Dizziness per patient   Sertraline     Makes her feel like she is going to kill someone    Family History  Problem Relation Age of Onset   Cancer Father    Prostate cancer Father    Diabetes Sister    Cancer Sister    Stroke Maternal Grandfather    Colitis Maternal Aunt    Breast cancer Maternal Aunt    Colon cancer Neg Hx    Colon polyps Neg Hx    Rectal cancer Neg Hx    Stomach cancer Neg Hx    Esophageal cancer Neg Hx      Prior to Admission medications   Medication Sig Start Date End Date Taking? Authorizing Provider  acetaminophen (TYLENOL) 500 MG tablet Take 2 tablets (1,000 mg total) by mouth every 6 (six) hours. 09/28/20   Elgie Collard, PA-C  ALPRAZolam (XANAX XR) 1 MG 24 hr tablet 1  qhs 03/26/21   Plovsky, Berneta Sages, MD  ALPRAZolam Duanne Moron) 0.5 MG tablet 2  qam   2  @2 ;00 03/26/21   Plovsky, Berneta Sages, MD  Ascorbic Acid (VITAMIN C) 1000 MG tablet Take 1,000 mg by mouth daily.    [provider]  aspirin 81 MG tablet Take 1 tablet (81 mg total) by mouth daily. Restart on 08/31/20 08/25/20   Judith Part, MD  carboxymethylcellulose (REFRESH PLUS) 0.5 % SOLN Place 1-2 drops into both eyes daily.    [provider]  citalopram (CELEXA) 10 MG tablet Take 1 tablet (10 mg total) by mouth daily. 03/26/21 03/26/22  Plovsky, Berneta Sages,  MD  folic acid (FOLVITE) 1 MG tablet Take 1 tablet by mouth once daily 04/03/21   Curt Bears, MD  loratadine (CLARITIN) 10 MG tablet Take 10 mg by mouth daily as needed for allergies.    [provider]  mirtazapine (REMERON) 45 MG tablet Take 1 tablet (45 mg total) by mouth at bedtime. 03/26/21   Plovsky, Berneta Sages, MD  omeprazole (PRILOSEC) 40 MG capsule TAKE 1 CAPSULE (40 MG TOTAL) BY MOUTH 2 (TWO) TIMES DAILY BEFORE A MEAL. 01/31/21   Burns, Claudina Lick, MD  polyethylene glycol (MIRALAX / GLYCOLAX) 17 g packet Take 17 g by mouth 2 (two) times daily. 08/19/20   Eugenie Filler, MD  pravastatin (PRAVACHOL) 40 MG tablet TAKE 1 TABLET (40 MG TOTAL) BY MOUTH EVERY EVENING. 01/31/21   [name], MD  Probiotic Product (PROBIOTIC DAILY PO) Take 1 capsule by mouth daily.    [provider]  prochlorperazine (COMPAZINE) 10 MG tablet Take 1 tablet (10 mg total) by mouth every 6 (six) hours as needed for nausea or vomiting. 10/23/20   Curt Bears, MD  senna-docusate (SENOKOT-S) 8.6-50 MG tablet Take 1 tablet by mouth 2 (two) times daily. Patient taking differently: Take 1 tablet by mouth daily as needed for mild constipation. 08/19/20   Eugenie Filler, MD  simethicone (MYLICON) 80 MG chewable tablet Chew 1 tablet (80 mg total) by mouth 4 (four) times daily as needed for flatulence  (Bloating). 09/28/20   Elgie Collard, PA-C  Wheat Dextrin (BENEFIBER) POWD Take 1 Scoop by mouth 2 (two) times daily as needed (constipation).    [provider]    Physical Exam: Vitals:   04/22/21 2100 04/22/21 2202 04/22/21 2215 04/22/21 2245  BP: (!) 148/86 (!) 159/93 (!) 167/91 (!) 166/87  Pulse: 87 85    Resp: (!) 21 (!) 28 18 15   Temp:  98.4 F (36.9 C)    TempSrc:      SpO2: 100% 99%    Weight:      Height:        Constitutional: NAD, calm, comfortable, elderly female lying flat in bed Vitals:   04/22/21 2100 04/22/21 2202 04/22/21 2215 04/22/21 2245  BP: (!) 148/86 (!) 159/93 (!) 167/91 (!) 166/87  Pulse: 87 85    Resp: (!) 21 (!) 28 18 15   Temp:  98.4 F (36.9 C)    TempSrc:      SpO2: 100% 99%    Weight:      Height:       Eyes: PERRL, lids and conjunctivae normal ENMT: Mucous membranes are moist.  No laceration on tongue Neck: normal, supple Respiratory: clear to auscultation bilaterally, no wheezing, no crackles. Normal respiratory effort.   Cardiovascular: Regular rate and rhythm, no murmurs / rubs / gallops. No extremity edema.  Abdomen: no tenderness,  Bowel sounds positive.  Musculoskeletal: no clubbing / cyanosis. No joint deformity upper and lower extremities. Normal muscle tone.  Skin:  Neurologic: CN 2-12 grossly intact. Sensation intact.  Has trouble word finding and stuttering when attempting to speak in full sentences.  Strength 5/5 in all 4 with strong handgrip.  Had rhythmic jerking of the right foot with movement which improved at rest Psychiatric: Normal judgment and insight. Alert and oriented Normal mood.    Labs on Admission: I have personally reviewed following labs and imaging studies  CBC: Recent Labs  Lab 04/22/21 1700  WBC 3.1*  HGB 9.2*  HCT 29.7*  MCV 88.7  PLT 097   Basic Metabolic Panel: Recent Labs  Lab 04/22/21 1700  NA 136  K 4.2  CL 100  CO2 28  GLUCOSE 108*  BUN 11  CREATININE 0.63  CALCIUM 9.7    GFR: Estimated Creatinine Clearance: 45.7 mL/min (by C-G formula based on SCr of 0.63 mg/dL). Liver Function Tests: No results for input(s): AST, ALT, ALKPHOS, BILITOT, PROT, ALBUMIN in the last 168 hours. No results for input(s): LIPASE, AMYLASE in the last 168 hours. No results for input(s): AMMONIA in the last 168 hours. Coagulation Profile: No results for input(s): INR, PROTIME in the last 168 hours. Cardiac Enzymes: No results for input(s): CKTOTAL, CKMB, CKMBINDEX, TROPONINI in the last 168 hours. BNP (last 3 results) No results for input(s): PROBNP in the last 8760 hours. HbA1C: No results for input(s): HGBA1C in the last 72 hours. CBG: Recent Labs  Lab 04/22/21 1657 04/22/21 1659 04/22/21 1905  GLUCAP 56* 107* 105*   Lipid Profile: No results for input(s): CHOL, HDL, LDLCALC, TRIG, CHOLHDL, LDLDIRECT in the last 72 hours. Thyroid Function Tests: No results for input(s): TSH, T4TOTAL, FREET4, T3FREE, THYROIDAB in the last 72 hours. Anemia Panel: No results for input(s): VITAMINB12, FOLATE, FERRITIN, TIBC, IRON, RETICCTPCT in the last 72 hours. Urine analysis:    Component Value Date/Time   COLORURINE YELLOW 04/22/2021 [place] 04/22/2021 1650   LABSPEC 1.018 04/22/2021 1650   PHURINE 5.5 04/22/2021 1650   GLUCOSEU NEGATIVE 04/22/2021 1650   HGBUR NEGATIVE 04/22/2021 1650   BILIRUBINUR NEGATIVE 04/22/2021 1650   BILIRUBINUR negative 09/12/2019 1001   KETONESUR NEGATIVE 04/22/2021 1650   PROTEINUR NEGATIVE 04/22/2021 1650   UROBILINOGEN negative (A) 09/12/2019 1001   NITRITE NEGATIVE 04/22/2021 1650   LEUKOCYTESUR TRACE (A) 04/22/2021 1650    Radiological Exams on Admission: CT Head Wo Contrast  Result Date: 04/22/2021 CLINICAL DATA:  Initial evaluation for acute mental status change, history of metastatic lung carcinoma with brain involvement. Status post chemotherapy. EXAM: CT HEAD WITHOUT CONTRAST TECHNIQUE: Contiguous axial images were  obtained from the base of the skull through the vertex without intravenous contrast. COMPARISON:  Previous CT from 02/13/2021 and brain MRI from 02/18/2021. FINDINGS: Brain: Postoperative changes from previous left frontal craniotomy are seen. Vasogenic edema involving the anterior left frontal lobe about the surgical resection site has progressed and worsened from prior exam, concerning for progressive disease. Additionally, there is progressive vasogenic edema at the left parieto-occipital region, presumably related to a previously identified subcentimeter metastatic implant at this location, also concerning for progressive disease. No other significant vasogenic edema. No significant midline shift. No hydrocephalus or ventricular trapping. Basilar cisterns remain patent. No definite new lesions evident by CT. No acute intracranial hemorrhage. No other visible acute large vessel territory infarct. No extra-axial fluid collection. Vascular: No hyperdense vessel. Skull: Prior left frontal craniotomy. Calvarium otherwise intact. No scalp soft tissue abnormality. Sinuses/Orbits: Globes and orbital soft tissues demonstrate no acute finding. Left sphenoid sinus retention cyst noted. Paranasal sinuses and mastoid air cells are otherwise clear. Other: None. IMPRESSION: 1. Postoperative changes from previous left frontal craniotomy. Progressive vasogenic edema involving the anterior left frontal lobe about the surgical resection site, concerning for progressive disease. If the patient has undergone recent XRT, possible post radiation changes could also be considered. 2. Progressive vasogenic edema at the left parieto-occipital region, presumably related to a previously identified subcentimeter metastatic implant at this location, also concerning for progressive disease versus post XRT changes. 3. No other new acute  intracranial abnormality. Electronically Signed   By: Jeannine Boga M.D.   On: 04/22/2021 21:02   DG  Chest Portable 1 View  Result Date: 04/22/2021 CLINICAL DATA:  Evaluate for infection. EXAM: PORTABLE CHEST 1 VIEW COMPARISON:  Chest radiograph dated 02/12/2021 and CT dated 03/14/2021. FINDINGS: Postsurgical changes of the right lung with persistent tenting of the right hemidiaphragm and small right pleural effusion similar to prior exams. The left lung is clear. No pneumothorax. Stable cardiac silhouette. No acute osseous pathology. IMPRESSION: Stable postsurgical changes of the right lung with small right pleural effusion. No new consolidation. Electronically Signed   By: Anner Crete M.D.   On: 04/22/2021 20:44      Assessment/Plan  Aphasia/right UE weakness/Right foot jerking Non-small cell lung cancer with brain metastasis -CT head with vasogenic edema without midline shift concerning for progressive metastatic disease -Obtain stat MRI, routine EEG -Continue IV Decadron 6mg  q12hr per neuro rec  -frequent neurochecks -Neurology consulted.  Appreciate further recommendation  Seizure in the setting of vasogenic edema -had episode of witnessed tonic-clonic seizure at outside ED and here at [place]  Fever/Leukopenia -had one initally fever reading of 100.4 at outside ED. Given leukopenia and confusion, started on empiric Rocephin and vancomycin for concerns of bacterial meningitis.  LP contraindicated with brain metastasis. - UA also showed trace leukocyte, negative nitrite but many bacteria, 11-20 WBC however patient denies urinary symptoms - continue broad-spectrum antibiotics for now pending urine and blood culture.  Continue to follow fever curve  Non-small cell right upper lobe lung cancer with brain metastasis --diagnosed in February 2022  s/p SRS, craniotomy and resection on solitary lesion in 07/2020 repeat SRS of two new brain mets 11/29/20  robot assisted right upper lobectomy on active chemotherapy - Sees Dr. Billey Gosling of oncology - Last  chemotherapy session on 04/11/2021  HTN  -mildly elevated. Not on any hypertensives -add PRN IV labetalol for systolic greater than 287 and diastolic greater than 867  Aortic sclerosis with insufficiency -Mild to moderate seen on echo in March 2022.  Continue follow-up with cardiology outpatient  Hyperlipidemia Continue statin   Type 2 diabetes Controlled. Last hemoglobin A1c of 6 on 03/20/2021  DVT prophylaxis:SCD  Code Status: Full Family Communication: Plan discussed with patient and daughter at bedside  disposition Plan: Home with observation Consults called: Neurology Admission status: Observation   Level of care: Progressive  Status is: Observation  The patient remains OBS appropriate and will d/c before 2 midnights.        Orene Desanctis DO [facility]   If 7PM-7AM, please contact night-coverage [email or web address]   04/22/2021, 11:09 PM

## 2021-04-22 NOTE — ED Notes (Addendum)
Tonic clonic seizure like activity witnessed by RN, lasting approximately 1 minute. Pt held in protective position, no injury incurred. Daughter at bedside reported she has not had seizures prior to today, reports witnessing similar seizure like activity occurring earlier today.

## 2021-04-22 NOTE — Progress Notes (Signed)
Pharmacy Antibiotic Note  Hannah [name] is a 70 y.o. female admitted on 04/22/2021.  Pharmacy has been consulted for vancomycin dosing.  Patient with a history of frontal brain tumor secondary to NSCLC. Patient presenting with weakness. Patient with noted significant edema to brain on CT. Patient is at [facility] with likely transfer to [facility].  SCr 0.63 - at baseline WBC 3.1  Plan: Ceftriaxone per MD Vancomycin 1000 mg once then 500 mg q12hr (per nomogram dosing) unless change in renal function Trend WBC, Fever, Renal function, & Clinical course F/u cultures, neurosurgery recommendations Levels at steady state De-escalate when able  Height: 5' (152.4 cm) Weight: 51.3 kg (113 lb) IBW/kg (Calculated) : 45.5  Temp (24hrs), Avg:99.6 F (37.6 C), Min:98.7 F (37.1 C), Max:100.4 F (38 C)  Recent Labs  Lab 04/22/21 1700  WBC 3.1*  CREATININE 0.63    Estimated Creatinine Clearance: 45.7 mL/min (by C-G formula based on SCr of 0.63 mg/dL).    Allergies  Allergen Reactions   Amlodipine Swelling and Rash    Rash, swelling   Chantix [Varenicline Tartrate] Shortness Of Breath, Swelling and Other (See Comments)    Tongue swell,sob   Clarithromycin Rash   Lisinopril Hives   Simvastatin Hives   Wellbutrin [Bupropion Hcl] Hives   Lipitor [Atorvastatin]     Dizziness per patient   Sertraline     Makes her feel like she is going to kill someone   Antimicrobials this admission: rocephin 10/24 >>  vancomycin 10/24 >>   Microbiology results: Pending  Thank you for allowing pharmacy to be a part of this patient's care.  Lorelei Pont, PharmD, BCPS 04/22/2021 8:50 PM ED Clinical Pharmacist -  [phone]

## 2021-04-22 NOTE — Telephone Encounter (Signed)
Discussed pts concerns with Dr. Julien Nordmann who advised for pt to be seen in the ER for evaluation, particularly with her hx of brain mets.  I spoke with pts daughter (she is the person who sent this message, not the pt). Despite strong encouragement to follow through with the recommendation, pts daughter was adamant that pt isn't going. We discussed several reasons why the pt needs to but I was not able to convince her.

## 2021-04-23 ENCOUNTER — Other Ambulatory Visit: Payer: Self-pay | Admitting: Radiation Therapy

## 2021-04-23 ENCOUNTER — Observation Stay (HOSPITAL_COMMUNITY): Payer: Medicare HMO

## 2021-04-23 DIAGNOSIS — I700 Atherosclerosis of aorta: Secondary | ICD-10-CM | POA: Diagnosis present

## 2021-04-23 DIAGNOSIS — R569 Unspecified convulsions: Secondary | ICD-10-CM | POA: Diagnosis present

## 2021-04-23 DIAGNOSIS — I10 Essential (primary) hypertension: Secondary | ICD-10-CM | POA: Diagnosis not present

## 2021-04-23 DIAGNOSIS — R4701 Aphasia: Secondary | ICD-10-CM | POA: Diagnosis present

## 2021-04-23 DIAGNOSIS — Z803 Family history of malignant neoplasm of breast: Secondary | ICD-10-CM | POA: Diagnosis not present

## 2021-04-23 DIAGNOSIS — Z923 Personal history of irradiation: Secondary | ICD-10-CM | POA: Diagnosis not present

## 2021-04-23 DIAGNOSIS — D709 Neutropenia, unspecified: Secondary | ICD-10-CM | POA: Diagnosis present

## 2021-04-23 DIAGNOSIS — C7931 Secondary malignant neoplasm of brain: Principal | ICD-10-CM

## 2021-04-23 DIAGNOSIS — Z79899 Other long term (current) drug therapy: Secondary | ICD-10-CM | POA: Diagnosis not present

## 2021-04-23 DIAGNOSIS — Z7982 Long term (current) use of aspirin: Secondary | ICD-10-CM | POA: Diagnosis not present

## 2021-04-23 DIAGNOSIS — Z833 Family history of diabetes mellitus: Secondary | ICD-10-CM | POA: Diagnosis not present

## 2021-04-23 DIAGNOSIS — Z8042 Family history of malignant neoplasm of prostate: Secondary | ICD-10-CM | POA: Diagnosis not present

## 2021-04-23 DIAGNOSIS — Z823 Family history of stroke: Secondary | ICD-10-CM | POA: Diagnosis not present

## 2021-04-23 DIAGNOSIS — Z888 Allergy status to other drugs, medicaments and biological substances status: Secondary | ICD-10-CM | POA: Diagnosis not present

## 2021-04-23 DIAGNOSIS — R4182 Altered mental status, unspecified: Secondary | ICD-10-CM | POA: Diagnosis not present

## 2021-04-23 DIAGNOSIS — Z9221 Personal history of antineoplastic chemotherapy: Secondary | ICD-10-CM | POA: Diagnosis not present

## 2021-04-23 DIAGNOSIS — G936 Cerebral edema: Secondary | ICD-10-CM | POA: Diagnosis not present

## 2021-04-23 DIAGNOSIS — Z87891 Personal history of nicotine dependence: Secondary | ICD-10-CM | POA: Diagnosis not present

## 2021-04-23 DIAGNOSIS — Z20822 Contact with and (suspected) exposure to covid-19: Secondary | ICD-10-CM | POA: Diagnosis present

## 2021-04-23 DIAGNOSIS — G9341 Metabolic encephalopathy: Secondary | ICD-10-CM | POA: Diagnosis present

## 2021-04-23 DIAGNOSIS — Z85118 Personal history of other malignant neoplasm of bronchus and lung: Secondary | ICD-10-CM | POA: Diagnosis not present

## 2021-04-23 DIAGNOSIS — E782 Mixed hyperlipidemia: Secondary | ICD-10-CM | POA: Diagnosis present

## 2021-04-23 DIAGNOSIS — Z96653 Presence of artificial knee joint, bilateral: Secondary | ICD-10-CM | POA: Diagnosis present

## 2021-04-23 DIAGNOSIS — M1990 Unspecified osteoarthritis, unspecified site: Secondary | ICD-10-CM | POA: Diagnosis present

## 2021-04-23 DIAGNOSIS — R531 Weakness: Secondary | ICD-10-CM | POA: Diagnosis not present

## 2021-04-23 DIAGNOSIS — E119 Type 2 diabetes mellitus without complications: Secondary | ICD-10-CM | POA: Diagnosis present

## 2021-04-23 LAB — CBC
HCT: 30.4 % — ABNORMAL LOW (ref 36.0–46.0)
Hemoglobin: 9.6 g/dL — ABNORMAL LOW (ref 12.0–15.0)
MCH: 27.7 pg (ref 26.0–34.0)
MCHC: 31.6 g/dL (ref 30.0–36.0)
MCV: 87.9 fL (ref 80.0–100.0)
Platelets: 221 10*3/uL (ref 150–400)
RBC: 3.46 MIL/uL — ABNORMAL LOW (ref 3.87–5.11)
RDW: 13.5 % (ref 11.5–15.5)
WBC: 1.8 10*3/uL — ABNORMAL LOW (ref 4.0–10.5)
nRBC: 0 % (ref 0.0–0.2)

## 2021-04-23 LAB — URINE CULTURE: Culture: <10000 — AB

## 2021-04-23 LAB — BASIC METABOLIC PANEL
Anion gap: 8 (ref 5–15)
BUN: 7 mg/dL — ABNORMAL LOW (ref 8–23)
CO2: 26 mmol/L (ref 22–32)
Calcium: 9.8 mg/dL (ref 8.9–10.3)
Chloride: 102 mmol/L (ref 98–111)
Creatinine, Ser: 0.66 mg/dL (ref 0.44–1.00)
GFR, Estimated: >60 mL/min (ref >60)
Glucose, Bld: 160 mg/dL — ABNORMAL HIGH (ref 70–99)
Potassium: 4.1 mmol/L (ref 3.5–5.1)
Sodium: 136 mmol/L (ref 135–145)

## 2021-04-23 MED ORDER — POLYETHYLENE GLYCOL 3350 17 G PO PACK
17.0000 g | PACK | Freq: Two times a day (BID) | ORAL | Status: DC
  Administered 2021-04-23 – 2021-04-27 (×4): 17 g via ORAL
  Filled 2021-04-23 (×9): qty 1

## 2021-04-23 MED ORDER — MIRTAZAPINE 15 MG PO TABS
45.0000 mg | ORAL_TABLET | Freq: Every day | ORAL | Status: DC
  Administered 2021-04-23 – 2021-04-27 (×6): 45 mg via ORAL
  Filled 2021-04-23: qty 1
  Filled 2021-04-23 (×3): qty 3
  Filled 2021-04-23: qty 1
  Filled 2021-04-23 (×2): qty 3

## 2021-04-23 MED ORDER — SIMETHICONE 80 MG PO CHEW
80.0000 mg | CHEWABLE_TABLET | Freq: Four times a day (QID) | ORAL | Status: DC | PRN
  Administered 2021-04-23 – 2021-04-26 (×3): 80 mg via ORAL
  Filled 2021-04-23 (×5): qty 1

## 2021-04-23 MED ORDER — ACETAMINOPHEN 500 MG PO TABS
1000.0000 mg | ORAL_TABLET | Freq: Four times a day (QID) | ORAL | Status: DC
  Administered 2021-04-23 – 2021-04-25 (×9): 1000 mg via ORAL
  Filled 2021-04-23 (×11): qty 2

## 2021-04-23 MED ORDER — POLYVINYL ALCOHOL 1.4 % OP SOLN
1.0000 [drp] | Freq: Every day | OPHTHALMIC | Status: DC
Start: 2021-04-23 — End: 2021-04-28
  Administered 2021-04-28: 2 [drp] via OPHTHALMIC
  Filled 2021-04-23 (×3): qty 15

## 2021-04-23 MED ORDER — ASCORBIC ACID 500 MG PO TABS
1000.0000 mg | ORAL_TABLET | Freq: Every day | ORAL | Status: DC
  Administered 2021-04-23 – 2021-04-25 (×3): 1000 mg via ORAL
  Filled 2021-04-23 (×4): qty 2

## 2021-04-23 MED ORDER — PANTOPRAZOLE SODIUM 40 MG PO TBEC
40.0000 mg | DELAYED_RELEASE_TABLET | Freq: Every day | ORAL | Status: DC
  Administered 2021-04-23 – 2021-04-28 (×6): 40 mg via ORAL
  Filled 2021-04-23 (×6): qty 1

## 2021-04-23 MED ORDER — SODIUM CHLORIDE 0.9 % IV SOLN: 2.0000 g | Freq: Two times a day (BID) | INTRAVENOUS | Status: DC

## 2021-04-23 MED ORDER — DEXAMETHASONE 4 MG PO TABS
6.0000 mg | ORAL_TABLET | Freq: Two times a day (BID) | ORAL | Status: DC
  Administered 2021-04-23 – 2021-04-28 (×11): 6 mg via ORAL
  Filled 2021-04-23 (×5): qty 1
  Filled 2021-04-23: qty 2
  Filled 2021-04-23 (×5): qty 1

## 2021-04-23 MED ORDER — GADOBUTROL 1 MMOL/ML IV SOLN
4.5000 mL | Freq: Once | INTRAVENOUS | Status: AC | PRN
  Administered 2021-04-23: 4.5 mL via INTRAVENOUS

## 2021-04-23 MED ORDER — FOLIC ACID 1 MG PO TABS
1.0000 mg | ORAL_TABLET | Freq: Every day | ORAL | Status: DC
  Administered 2021-04-23 – 2021-04-28 (×6): 1 mg via ORAL
  Filled 2021-04-23 (×6): qty 1

## 2021-04-23 MED ORDER — CITALOPRAM HYDROBROMIDE 10 MG PO TABS
10.0000 mg | ORAL_TABLET | Freq: Every day | ORAL | Status: DC
Start: 2021-04-23 — End: 2021-04-28
  Administered 2021-04-23 – 2021-04-28 (×6): 10 mg via ORAL
  Filled 2021-04-23 (×6): qty 1

## 2021-04-23 MED ORDER — LORATADINE 10 MG PO TABS: 10.0000 mg | ORAL_TABLET | Freq: Every day | ORAL | Status: DC | PRN

## 2021-04-23 MED ORDER — SODIUM CHLORIDE 0.9 % IV SOLN
2.0000 g | Freq: Two times a day (BID) | INTRAVENOUS | Status: DC
  Administered 2021-04-23 – 2021-04-25 (×4): 2 g via INTRAVENOUS
  Filled 2021-04-23 (×4): qty 2

## 2021-04-23 MED ORDER — SENNOSIDES-DOCUSATE SODIUM 8.6-50 MG PO TABS: 1.0000 | ORAL_TABLET | Freq: Every day | ORAL | Status: DC | PRN

## 2021-04-23 MED ORDER — ALPRAZOLAM 0.5 MG PO TABS
0.5000 mg | ORAL_TABLET | Freq: Three times a day (TID) | ORAL | Status: DC | PRN
  Administered 2021-04-23 – 2021-04-28 (×11): 0.5 mg via ORAL
  Filled 2021-04-23 (×5): qty 1
  Filled 2021-04-23: qty 2
  Filled 2021-04-23 (×5): qty 1

## 2021-04-23 MED ORDER — PRAVASTATIN SODIUM 40 MG PO TABS
40.0000 mg | ORAL_TABLET | Freq: Every evening | ORAL | Status: DC
  Administered 2021-04-23 – 2021-04-27 (×5): 40 mg via ORAL
  Filled 2021-04-23 (×5): qty 1

## 2021-04-23 NOTE — Procedures (Addendum)
Patient Name: Hannah [name]  MRN: [id number]  Epilepsy Attending: Lora Havens  Referring Physician/Provider: Dr Ileene Musa Date: 04/23/2021 Duration: 23.58 mins  Patient history: 70 year old female with new onset seizures in the setting of worsening edema related to metastatic lesions.  EEG to evaluate for seizure.  Level of alertness: Awake  AEDs during EEG study: LEV  Technical aspects: This EEG study was done with scalp electrodes positioned according to the 10-20 International system of electrode placement. Electrical activity was acquired at a sampling rate of 500Hz  and reviewed with a high frequency filter of 70Hz  and a low frequency filter of 1Hz . EEG data were recorded continuously and digitally stored.   Description: The posterior dominant rhythm consists of 9 Hz activity of moderate voltage (25-35 uV) seen predominantly in posterior head regions, symmetric and reactive to eye opening and eye closing. EEG showed lateralized periodic discharges in right hemisphere, maximal right frontal region every 2 to 3 seconds as well as intermittent 2 to 3 Hz delta slowing in right hemisphere, maximal right frontal region. Photic driving was not seen during photic stimulation.  Hyperventilation was not performed.     ABNORMALITY - Lateralized periodic discharges, right hemisphere, maximal right frontal region. -Intermittent slow,right hemisphere, maximal right frontal region.  IMPRESSION: This study showed evidence of epileptogenicity as well as cortical dysfunction in right hemisphere, maximal right frontal region likely due to underlying structural abnormality/metastatic disease/edema.  No seizures were seen throughout the recording.  Kymorah Korf Barbra Sarks

## 2021-04-23 NOTE — ED Notes (Signed)
Helped patient to the bedside toilet patient did well patient is now back in bed on the monitor with fasmily at bedside and call bell in reach

## 2021-04-23 NOTE — Plan of Care (Signed)
  Problem: Activity: Goal: Risk for activity intolerance will decrease Outcome: Progressing   Problem: Nutrition: Goal: Adequate nutrition will be maintained Outcome: Progressing   Problem: Elimination: Goal: Will not experience complications related to bowel motility Outcome: Progressing Goal: Will not experience complications related to urinary retention Outcome: Progressing   Problem: Safety: Goal: Ability to remain free from injury will improve Outcome: Progressing

## 2021-04-23 NOTE — ED Notes (Signed)
Pt assisted to bedside commode, EEG at bedside at this time

## 2021-04-23 NOTE — Consult Note (Signed)
Neurology Consultation Reason for Consult: Seizures Referring Physician: Trifan, M  CC: Aphasia  History is obtained from: Patient, daughter  HPI: Hannah [name] is a 70 y.o. female with a history of lung cancer metastatic to left frontal region.  She had initially presented with right-sided weakness and aphasia and was started on steroids with improvement.  Over the past day, she has been complaining of right upper extremity pain and has needed some more help than she typically needs with putting on chlothes, etc.  Today, it was noticed that she was having more difficulty with getting her words out.  She was brought into the emergency department at [facility] where a CT scan revealed increasing edema in the left frontal region.  She had a seizure while at [place], and a repeat seizure here.  She was given Keppra 1 g as well as Decadron 10 mg with significant improvement in her speech.      ROS: A 14 point ROS was performed and is negative except as noted in the HPI.  Past Medical History:  Diagnosis Date   Allergy    Anemia    Anxiety    Arthritis    Carpal tunnel syndrome    Constipation    senna C stool softeners help    Depression    Diverticulosis    Dyslipidemia    External hemorrhoids    GERD (gastroesophageal reflux disease)    Heart murmur    mild-moderate AR   Hiatal hernia    Hyperlipidemia    on meds    Hypertension    Internal hemorrhoids    lung ca with brain mets 07/2020   Osteoarthritis    Pre-diabetes    PVD (peripheral vascular disease) (HCC)    moderate carotid disease   RBBB    Smoker    Vocal cord polyps      Family History  Problem Relation Age of Onset   Cancer Father    Prostate cancer Father    Diabetes Sister    Cancer Sister    Stroke Maternal Grandfather    Colitis Maternal Aunt    Breast cancer Maternal Aunt    Colon cancer Neg Hx    Colon polyps Neg Hx    Rectal cancer Neg Hx    Stomach cancer Neg Hx    Esophageal cancer  Neg Hx      Social History:  reports that she quit smoking about 9 months ago. Her smoking use included cigarettes. She has a 10.00 pack-year smoking history. She has never been exposed to tobacco smoke. She has never used smokeless tobacco. She reports that she does not drink alcohol and does not use drugs.   Exam: Current vital signs: BP (!) 166/87   Pulse 85   Temp 98.4 F (36.9 C)   Resp 15   Ht 5' (1.524 m)   Wt 51.3 kg   SpO2 99%   BMI 22.07 kg/m  Vital signs in last 24 hours: Temp:  [98.4 F (36.9 C)-100.4 F (38 C)] 98.4 F (36.9 C) (10/24 2202) Pulse Rate:  [76-88] 85 (10/24 2202) Resp:  [14-31] 15 (10/24 2245) BP: (148-167)/(75-93) 166/87 (10/24 2245) SpO2:  [97 %-100 %] 99 % (10/24 2202) Weight:  [51.3 kg] 51.3 kg (10/24 1644)   Physical Exam  Constitutional: Appears well-developed and well-nourished.  Psych: Affect appropriate to situation Eyes: No scleral injection HENT: No OP obstruction MSK: no joint deformities.  Cardiovascular: Normal rate and regular rhythm.  Respiratory:  Effort normal, non-labored breathing GI: Soft.  No distension. There is no tenderness.  Skin: WDI  Neuro: Mental Status: Patient is awake, alert, oriented to person, place, month, year, and situation. Patient is able to give a clear and coherent history. She has an increased latency of response, but is able to repeat, able to name simple objects, and her speech is relatively fluent when she begins responding. Cranial Nerves: II: Visual Fields are full. Pupils are equal, round, and reactive to light.   III,IV, VI: EOMI without ptosis or diploplia.  V: Facial sensation is symmetric to temperature VII: Facial movement is symmetric.  VIII: hearing is intact to voice X: Uvula elevates symmetrically XI: Shoulder shrug is symmetric. XII: tongue is midline without atrophy or fasciculations.  Motor: Tone is normal. Bulk is normal. 5/5 strength was present in the left arm and  bilateral legs, 4/5 in the right upper extremity Sensory: Sensation is symmetric to light touch and temperature in the arms and legs. Cerebellar: No clear ataxia     I have reviewed labs in epic and the results pertinent to this consultation are: Sodium 136 Calcium 9.7 Glucose 108  UA, 11-20 WBC with many bacteria, nitrate negative.  I have reviewed the images obtained: CT head-left frontal edema  Impression: 70 year old female with new onset seizures in the setting of worsening edema related to metastatic lesions.  This could represent postradiation changes versus progression of disease.  Either way, the short-term treatment consists of antiepileptic therapy and steroids.  She has already been given Decadron, she had a very rapid response, make me wonder if seizures were the primary culprit today, but I would continue steroids just the same.  Recommendations: 1) Decadron, 6 mg every 12 hours 2) Keppra 1 g twice daily 3) routine EEG 4) MRI brain with and without contrast   Roland Rack, MD [facility] [phone]  If 7pm- 7am, please page neurology on call as listed in [place].

## 2021-04-23 NOTE — Progress Notes (Addendum)
PROGRESS NOTE    Hannah [name]  GOT:[id number] DOB: 08/13/1950 DOA: 04/22/2021 PCP: [name], MD   Chief Complaint  Patient presents with   Weakness    Brief Narrative:   Hannah [name] is a 70 y.o. 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  She was noted to have seizures, which is new onset, as well she had an episode noted in ED as well, he was given Keppra 1 g, as well started on Decadron, and admitted to [facility] for further management.  Assessment & Plan:   Principal Problem:   Vasogenic brain edema (HCC) Active Problems:   Mixed hyperlipidemia   HTN (hypertension)   Diabetes mellitus without complication ([place])   Brain metastasis ([place])   Primary cancer of right upper lobe of lung (HCC)   S/P robot-assisted surgical procedure   Acute encephalopathy   Seizure (HCC)  Seizures/altered mental status/seizures/vasogenic brain edema -This in the setting of non-small lung cancer with brain metastasis, MRI of the brain showing increasing size of the lesions(possibly related post stereotactic necrosis) with surrounding edema. -Continue with Keppra, and Decadron, discussed with Dr. Mickeal Skinner, he will arrange for outpatient follow-up next week, but recommendation to discharge on Keppra and Decadron seen as an outpatient -Continue with seizures precaution. -Neurology on board -Consult PT/OT   Fever/Leukopenia -had one initally fever reading of 100.4 at outside ED. patient with worsening leukopenia, it is 1.8 today, empirically she is on vancomycin and  Rocephin, but low clinical suspicion for bacterial meningitis , she will be transitioned to cefepime for neutropenic fever, she has no Port-A-Cath, she has no mucositis, no indication for vancomycin currently . -Follow on septic work-up for now .   Non-small cell right upper lobe lung cancer with brain metastasis  -diagnosed in February 2022 - s/p SRS, craniotomy and resection on solitary lesion in 07/2020 repeat SRS of two new brain mets 11/29/20  robot assisted right upper lobectomy on active chemotherapy - Last chemotherapy session on 04/11/2021   HTN  -mildly elevated. Not on any hypertensives -add PRN IV labetalol for systolic greater than 597 and diastolic greater than 416   Aortic sclerosis with insufficiency -Mild to moderate seen on echo in March 2022.  Continue follow-up with cardiology outpatient   Hyperlipidemia Continue statin    Type 2 diabetes Controlled. Last hemoglobin A1c of 6 on 03/20/2021   DVT prophylaxis: SCD's Code Status: Full Family Communication: D/W daughter at bedside Disposition:   Status is: inpatient  The patient will require care spanning > 2 midnights and should be moved to inpatient because: Patient is with neutropenic fever, she is on IV antibiotics      Consultants:  Neurology With urology oncology, recommendation for Keppra and Decadron, and Dr. Mickeal Skinner will arrange outpatient follow-up next week.  Subjective: Reports she is feeling weak, tired, mentation still not back at baseline,  Objective: Vitals:   04/23/21 0800 04/23/21 0900 04/23/21 1000 04/23/21 1100  BP: 116/88 (!) 130/95 (!) 144/83 137/82  Pulse: 79 73 74 70  Resp: 14  (!) 22 14  Temp:      TempSrc:  SpO2: 99% 100% 99% 100%  Weight:      Height:        Intake/Output Summary (Last 24 hours) at 04/23/2021 1330 Last data filed at 04/23/2021 1118 Gross per 24 hour  Intake 100 ml  Output 475 ml  Net -375 ml   Filed Weights   04/22/21 1644  Weight: 51.3 kg     Examination:  Awake Alert, Oriented X 3, No new F.N deficits, Normal affect, frail, thin appearing, chronically ill-appearing. Symmetrical Chest wall movement, Good air movement bilaterally, CTAB RRR,No Gallops,Rubs or new Murmurs, No Parasternal Heave +ve B.Sounds, Abd Soft, No tenderness, No rebound - guarding or rigidity. No Cyanosis, Clubbing or edema, No new Rash or bruise      Data Reviewed: I have personally reviewed following labs and imaging studies  CBC: Recent Labs  Lab 04/22/21 1700 04/23/21 0410  WBC 3.1* 1.8*  HGB 9.2* 9.6*  HCT 29.7* 30.4*  MCV 88.7 87.9  PLT 206 009    Basic Metabolic Panel: Recent Labs  Lab 04/22/21 1700 04/23/21 0410  NA 136 136  K 4.2 4.1  CL 100 102  CO2 28 26  GLUCOSE 108* 160*  BUN 11 7*  CREATININE 0.63 0.66  CALCIUM 9.7 9.8    GFR: Estimated Creatinine Clearance: 45.7 mL/min (by C-G formula based on SCr of 0.66 mg/dL).  Liver Function Tests: No results for input(s): AST, ALT, ALKPHOS, BILITOT, PROT, ALBUMIN in the last 168 hours.  CBG: Recent Labs  Lab 04/22/21 1657 04/22/21 1659 04/22/21 1905  GLUCAP 56* 107* 105*     Recent Results (from the past 240 hour(s))  Resp Panel by RT-PCR (Flu A&B, Covid) Nasopharyngeal Swab     Status: None   Collection Time: 04/22/21  8:09 PM   Specimen: Nasopharyngeal Swab; Nasopharyngeal(NP) swabs in vial transport medium  Result Value Ref Range Status   SARS Coronavirus 2 by RT PCR NEGATIVE NEGATIVE Final    Comment: (NOTE) SARS-CoV-2 target nucleic acids are NOT DETECTED.  The SARS-CoV-2 RNA is generally detectable in upper respiratory specimens during the acute phase of infection. The lowest concentration of SARS-CoV-2 viral copies this assay can detect is 138 copies/mL. A negative result does not preclude SARS-Cov-2 infection and should not be used as the sole basis for treatment or other patient management decisions. A negative result may occur with  improper  specimen collection/handling, submission of specimen other than nasopharyngeal swab, presence of viral mutation(s) within the areas targeted by this assay, and inadequate number of viral copies(<138 copies/mL). A negative result must be combined with clinical observations, patient history, and epidemiological information. The expected result is Negative.  Fact Sheet for Patients:  EntrepreneurPulse.com.au  Fact Sheet for Healthcare Providers:  IncredibleEmployment.be  This test is no t yet approved or cleared by the [place] FDA and  has been authorized for detection and/or diagnosis of SARS-CoV-2 by FDA under an Emergency Use Authorization (EUA). This EUA will remain  in effect (meaning this test can be used) for the duration of the COVID-19 declaration under Section 564(b)(1) of the Act, 21 U.S.C.section 360bbb-3(b)(1), unless the authorization is terminated  or revoked sooner.       Influenza A by PCR NEGATIVE NEGATIVE Final   Influenza B by PCR NEGATIVE NEGATIVE Final    Comment: (NOTE) The Xpert Xpress SARS-CoV-2/FLU/RSV plus assay is intended as an aid in the diagnosis of influenza from Nasopharyngeal swab specimens and should not be used as a sole basis for treatment. Nasal  washings and aspirates are unacceptable for Xpert Xpress SARS-CoV-2/FLU/RSV testing.  Fact Sheet for Patients: EntrepreneurPulse.com.au  Fact Sheet for Healthcare Providers: IncredibleEmployment.be  This test is not yet approved or cleared by the [place] FDA and has been authorized for detection and/or diagnosis of SARS-CoV-2 by FDA under an Emergency Use Authorization (EUA). This EUA will remain in effect (meaning this test can be used) for the duration of the COVID-19 declaration under Section 564(b)(1) of the Act, 21 U.S.C. section 360bbb-3(b)(1), unless the authorization is terminated or revoked.  Performed at  [facility], [address] [place], [place] [address]   Culture, blood (routine x 2)     Status: None (Preliminary result)   Collection Time: 04/23/21  2:10 AM   Specimen: BLOOD RIGHT FOREARM  Result Value Ref Range Status   Specimen Description BLOOD RIGHT FOREARM  Final   Special Requests   Final    BOTTLES DRAWN AEROBIC AND ANAEROBIC BACTEROIDES CACCAE   Culture   Final    NO GROWTH < 12 HOURS Performed at [facility], [place] [address] [place], [place] [address]    Report Status PENDING  Incomplete  Culture, blood (routine x 2)     Status: None (Preliminary result)   Collection Time: 04/23/21  2:19 AM   Specimen: BLOOD  Result Value Ref Range Status   Specimen Description BLOOD LEFT ANTECUBITAL  Final   Special Requests   Final    BOTTLES DRAWN AEROBIC AND ANAEROBIC Blood Culture adequate volume   Culture   Final    NO GROWTH < 12 HOURS Performed at [facility], [place] [address] [place], [place] [address]    Report Status PENDING  Incomplete         Radiology Studies: CT Head Wo Contrast  Result Date: 04/22/2021 CLINICAL DATA:  Initial evaluation for acute mental status change, history of metastatic lung carcinoma with brain involvement. Status post chemotherapy. EXAM: CT HEAD WITHOUT CONTRAST TECHNIQUE: Contiguous axial images were obtained from the base of the skull through the vertex without intravenous contrast. COMPARISON:  Previous CT from 02/13/2021 and brain MRI from 02/18/2021. FINDINGS: Brain: Postoperative changes from previous left frontal craniotomy are seen. Vasogenic edema involving the anterior left frontal lobe about the surgical resection site has progressed and worsened from prior exam, concerning for progressive disease. Additionally, there is progressive vasogenic edema at the left parieto-occipital region, presumably related to a previously identified subcentimeter metastatic implant at this location, also concerning for  progressive disease. No other significant vasogenic edema. No significant midline shift. No hydrocephalus or ventricular trapping. Basilar cisterns remain patent. No definite new lesions evident by CT. No acute intracranial hemorrhage. No other visible acute large vessel territory infarct. No extra-axial fluid collection. Vascular: No hyperdense vessel. Skull: Prior left frontal craniotomy. Calvarium otherwise intact. No scalp soft tissue abnormality. Sinuses/Orbits: Globes and orbital soft tissues demonstrate no acute finding. Left sphenoid sinus retention cyst noted. Paranasal sinuses and mastoid air cells are otherwise clear. Other: None. IMPRESSION: 1. Postoperative changes from previous left frontal craniotomy. Progressive vasogenic edema involving the anterior left frontal lobe about the surgical resection site, concerning for progressive disease. If the patient has undergone recent XRT, possible post radiation changes could also be considered. 2. Progressive vasogenic edema at the left parieto-occipital region, presumably related to a previously identified subcentimeter metastatic implant at this location, also concerning for progressive disease versus post XRT changes. 3. No other new acute intracranial abnormality. Electronically Signed  By: Jeannine Boga M.D.   On: 04/22/2021 21:02   MR Brain W and Wo Contrast  Result Date: 04/23/2021 CLINICAL DATA:  Mental status change with unknown cause. History of brain metastases. EXAM: MRI HEAD WITHOUT AND WITH CONTRAST TECHNIQUE: Multiplanar, multiecho pulse sequences of the brain and surrounding structures were obtained without and with intravenous contrast. CONTRAST:  4.62mL GADAVIST GADOBUTROL 1 MMOL/ML IV SOLN COMPARISON:  Head CT from yesterday.  Brain MRI 02/18/2021 FINDINGS: Brain: Hazy, ill-defined masslike enhancement in the high left frontal lobe subjacent to the craniotomy, progressed to 19 mm in area on coronal postcontrast imaging.  Significant interval increase in brain edema. More mildly increased size of enhancing 7 mm metastasis in the left parietal lobe, also with attendant increase in edema. No infarct, hemorrhage, hydrocephalus, or collection. Vascular: Normal flow voids and vascular enhancement Skull and upper cervical spine: Normal marrow signal. Cervical facet spurring. Sinuses/Orbits: Negative IMPRESSION: Increased enhancement and vasogenic edema at the treated left frontal and left parietal metastases. Electronically Signed   By: Jorje Guild M.D.   On: 04/23/2021 04:45   DG Chest Portable 1 View  Result Date: 04/22/2021 CLINICAL DATA:  Evaluate for infection. EXAM: PORTABLE CHEST 1 VIEW COMPARISON:  Chest radiograph dated 02/12/2021 and CT dated 03/14/2021. FINDINGS: Postsurgical changes of the right lung with persistent tenting of the right hemidiaphragm and small right pleural effusion similar to prior exams. The left lung is clear. No pneumothorax. Stable cardiac silhouette. No acute osseous pathology. IMPRESSION: Stable postsurgical changes of the right lung with small right pleural effusion. No new consolidation. Electronically Signed   By: Anner Crete M.D.   On: 04/22/2021 20:44   EEG adult  Result Date: 04/23/2021 Lora Havens, MD     04/23/2021 11:33 AM Patient Name: ASPIN PALOMAREZ MRN: [id number] Epilepsy Attending: Lora Havens Referring Physician/Provider: Dr Ileene Musa Date: 04/23/2021 Duration: 23.58 mins Patient history: 70 year old female with new onset seizures in the setting of worsening edema related to metastatic lesions.  EEG to evaluate for seizure. Level of alertness: Awake AEDs during EEG study: LEV Technical aspects: This EEG study was done with scalp electrodes positioned according to the 10-20 International system of electrode placement. Electrical activity was acquired at a sampling rate of 500Hz  and reviewed with a high frequency filter of 70Hz  and a low frequency filter of 1Hz .  EEG data were recorded continuously and digitally stored. Description: The posterior dominant rhythm consists of 9 Hz activity of moderate voltage (25-35 uV) seen predominantly in posterior head regions, symmetric and reactive to eye opening and eye closing. EEG showed lateralized periodic discharges in right hemisphere, maximal right frontal region every 2 to 3 seconds as well as intermittent 2 to 3 Hz delta slowing in right hemisphere, maximal right frontal region. Photic driving was not seen during photic stimulation.  Hyperventilation was not performed.   ABNORMALITY - Lateralized periodic discharges, right hemisphere, maximal right frontal region. -Intermittent slow,right hemisphere, maximal right frontal region. IMPRESSION: This study showed evidence of epileptogenicity as well as cortical dysfunction in right hemisphere, maximal right frontal region likely due to underlying structural abnormality/metastatic disease/edema.  No seizures were seen throughout the recording. Priyanka Barbra Sarks        Scheduled Meds:  acetaminophen  1,000 mg Oral Q6H   vitamin C  1,000 mg Oral Daily   citalopram  10 mg Oral Daily   dexamethasone  6 mg Oral T51V   folic acid  1 mg Oral Daily  mirtazapine  45 mg Oral QHS   pantoprazole  40 mg Oral Daily   polyethylene glycol  17 g Oral BID   polyvinyl alcohol  1-2 drop Both Eyes Daily   pravastatin  40 mg Oral QPM   Continuous Infusions:  cefTRIAXone (ROCEPHIN)  IV     levETIRAcetam Stopped (04/23/21 1118)   vancomycin 500 mg (04/23/21 1154)     LOS: 0 days        Phillips Climes, MD [facility]   To contact the attending provider between 7A-7P or the covering provider during after hours 7P-7A, please log into the web site [email or web address] and access using universal [place] password for that web site. If you do not have the password, please call the hospital operator.  04/23/2021, 1:30 PM

## 2021-04-23 NOTE — ED Notes (Signed)
Patient transported to MRI by RN

## 2021-04-23 NOTE — Progress Notes (Signed)
EEG done at bedside. Results pending.  

## 2021-04-23 NOTE — Progress Notes (Signed)
Pharmacy Antibiotic Note  Hannah [name] is a 71 y.o. female admitted on 04/22/2021.  Pharmacy has been consulted for cefepime dosing.  Patient with a history of frontal brain tumor secondary to NSCLC. Patient presenting with weakness. Patient with noted significant edema to brain on CT. Patient is at [facility] with likely transfer to [facility].  SCr 0.63 - at baseline WBC 3.1  Plan: Ceftriaxone per MD - now discontinued Vancomycin 1000 mg once then 500 mg q12hr (per nomogram dosing) unless change in renal function - now discontinued Cefepime 2g IV q12h  Trend WBC, Fever, Renal function, & Clinical course F/u cultures, neurosurgery recommendations Levels at steady state De-escalate when able  Height: 5' (152.4 cm) Weight: 51.3 kg (113 lb) IBW/kg (Calculated) : 45.5  Temp (24hrs), Avg:98.7 F (37.1 C), Min:97.9 F (36.6 C), Max:100.4 F (38 C)  Recent Labs  Lab 04/22/21 1700 04/23/21 0410  WBC 3.1* 1.8*  CREATININE 0.63 0.66     Estimated Creatinine Clearance: 45.7 mL/min (by C-G formula based on SCr of 0.66 mg/dL).    Allergies  Allergen Reactions   Amlodipine Swelling and Rash    Rash, swelling   Chantix [Varenicline Tartrate] Shortness Of Breath, Swelling and Other (See Comments)    Tongue swell,sob   Clarithromycin Rash   Lisinopril Hives   Simvastatin Hives   Wellbutrin [Bupropion Hcl] Hives   Lipitor [Atorvastatin]     Dizziness per patient   Sertraline     Makes her feel like she is going to kill someone   Antimicrobials this admission: rocephin 10/24 >>  vancomycin 10/24 >>  Cefepime 10/15 >>  Microbiology results: Pending  Thank you for allowing pharmacy to be a part of this patient's care.  Joetta Manners, PharmD, [facility] Emergency Medicine Clinical Pharmacist ED RPh Phone: [place]: [phone]

## 2021-04-24 DIAGNOSIS — G936 Cerebral edema: Secondary | ICD-10-CM | POA: Diagnosis not present

## 2021-04-24 DIAGNOSIS — I10 Essential (primary) hypertension: Secondary | ICD-10-CM | POA: Diagnosis not present

## 2021-04-24 LAB — CBC
HCT: 28.7 % — ABNORMAL LOW (ref 36.0–46.0)
Hemoglobin: 8.8 g/dL — ABNORMAL LOW (ref 12.0–15.0)
MCH: 27.4 pg (ref 26.0–34.0)
MCHC: 30.7 g/dL (ref 30.0–36.0)
MCV: 89.4 fL (ref 80.0–100.0)
Platelets: 213 10*3/uL (ref 150–400)
RBC: 3.21 MIL/uL — ABNORMAL LOW (ref 3.87–5.11)
RDW: 13.7 % (ref 11.5–15.5)
WBC: 3 10*3/uL — ABNORMAL LOW (ref 4.0–10.5)
nRBC: 0 % (ref 0.0–0.2)

## 2021-04-24 LAB — COMPREHENSIVE METABOLIC PANEL
ALT: 14 U/L (ref 0–44)
AST: 22 U/L (ref 15–41)
Albumin: 2.8 g/dL — ABNORMAL LOW (ref 3.5–5.0)
Alkaline Phosphatase: 61 U/L (ref 38–126)
Anion gap: 8 (ref 5–15)
BUN: 10 mg/dL (ref 8–23)
CO2: 24 mmol/L (ref 22–32)
Calcium: 9.8 mg/dL (ref 8.9–10.3)
Chloride: 108 mmol/L (ref 98–111)
Creatinine, Ser: 0.66 mg/dL (ref 0.44–1.00)
GFR, Estimated: >60 mL/min (ref >60)
Glucose, Bld: 140 mg/dL — ABNORMAL HIGH (ref 70–99)
Potassium: 4.2 mmol/L (ref 3.5–5.1)
Sodium: 140 mmol/L (ref 135–145)
Total Bilirubin: 0.4 mg/dL (ref 0.3–1.2)
Total Protein: 6.4 g/dL — ABNORMAL LOW (ref 6.5–8.1)

## 2021-04-24 MED ORDER — LORAZEPAM 2 MG/ML IJ SOLN: 1.0000 mg | INTRAMUSCULAR | Status: DC | PRN

## 2021-04-24 MED ORDER — ENSURE ENLIVE PO LIQD
237.0000 mL | Freq: Two times a day (BID) | ORAL | Status: DC
  Administered 2021-04-24 – 2021-04-25 (×3): 237 mL via ORAL

## 2021-04-24 MED ORDER — ADULT MULTIVITAMIN W/MINERALS CH
1.0000 | ORAL_TABLET | Freq: Every day | ORAL | Status: DC
  Administered 2021-04-25: 1 via ORAL
  Filled 2021-04-24 (×2): qty 1

## 2021-04-24 MED ORDER — LEVETIRACETAM IN NACL 500 MG/100ML IV SOLN
500.0000 mg | Freq: Once | INTRAVENOUS | Status: AC
  Administered 2021-04-24: 500 mg via INTRAVENOUS
  Filled 2021-04-24: qty 100

## 2021-04-24 MED ORDER — LEVETIRACETAM 750 MG PO TABS
1500.0000 mg | ORAL_TABLET | Freq: Two times a day (BID) | ORAL | Status: DC
  Administered 2021-04-24 – 2021-04-28 (×8): 1500 mg via ORAL
  Filled 2021-04-24 (×8): qty 2

## 2021-04-24 NOTE — Progress Notes (Signed)
Initial Nutrition Assessment  DOCUMENTATION CODES:  Not applicable  INTERVENTION:  Continue current diet as ordered, encourage PO intake Ensure Enlive po BID, each supplement provides 350 kcal and 20 grams of protein MVI with minerals daily  NUTRITION DIAGNOSIS:  Increased nutrient needs related to cancer and cancer related treatments as evidenced by estimated needs.  GOAL:  Patient will meet greater than or equal to 90% of their needs  MONITOR:  PO intake, Labs  REASON FOR ASSESSMENT:  Malnutrition Screening Tool    ASSESSMENT:  70 y.o. female with history of non-small cell lung cancer with brain mets (undergoing chemo) diverticulosis, HLD, HTN, GERD, diabetes, PVD, osteoarthritis presented to ED with confusion.   Pt noted to have new onset seizures recently and has continued to experience them this admission. Discussed intake with RN. Reports pt did have a short seizure this AM but no residual deficits. Reports that appetite has been good this shift.   Imaging obtained did show an increasing size of brain lesions with surrounding edema.   Attempted to call pt on room phone, unable to obtain nutrition history at this time, busy signal at this time. 3.8% weight loss noted in the last 3 months which is not severe, but concerning due to pt's advanced age and actively undergoing cancer treatments. Will add nutrition supplements to encourage adequate nutrition intake and discourage weight loss during admission.  Average Meal Intake: 10/26: 50% intake x 1 recorded meal  Nutritionally Relevant Medications: Scheduled Meds:  vitamin C  1,000 mg Oral Daily   dexamethasone  6 mg Oral B14N   folic acid  1 mg Oral Daily   mirtazapine  45 mg Oral QHS   pantoprazole  40 mg Oral Daily   polyethylene glycol  17 g Oral BID   pravastatin  40 mg Oral QPM   Continuous Infusions:  ceFEPime (MAXIPIME) IV 2 g (04/24/21 0046)   levETIRAcetam 1,000 mg (04/24/21 0836)   PRN Meds:  senna-docusate, simethicone  Labs Reviewed HgbA1c 6.0% (9/21)  NUTRITION - FOCUSED PHYSICAL EXAM: Defer to in-person assessment  Diet Order:   Diet Order             Diet regular Room service appropriate? Yes; Fluid consistency: Thin  Diet effective now                  EDUCATION NEEDS:  Education needs have been addressed  Skin:  Skin Assessment: Reviewed RN Assessment  Last BM:  10/25  Height:  Ht Readings from Last 1 Encounters:  04/22/21 5' (1.524 m)   Weight:  Wt Readings from Last 1 Encounters:  04/22/21 51.3 kg   Ideal Body Weight:  45.5 kg  BMI:  Body mass index is 22.07 kg/m.  Estimated Nutritional Needs:  Kcal:  1500-1700 kcal/d Protein:  75-90 g/d Fluid:  1.5-1.8 L/d   Ranell Patrick, RD, LDN Clinical Dietitian RD pager # available in [facility]  After hours/weekend pager # available in [facility]

## 2021-04-24 NOTE — Progress Notes (Signed)
NEUROLOGY CONSULTATION PROGRESS NOTE   Date of service: April 24, 2021 Patient Name: Hannah [name] MRN:  [id number] DOB:  02/07/1951  Brief HPI  Hannah [name] is a 70 year old female with new onset seizures in the setting of worsening edema on MRI brain related to metastatic lesions.  On Keppra 1G BID and Decadron 6mg  Q6H. Routine EEG with right frontal epileptiform discharges but no seizures.   Interval Hx   Today in the morning, had a 30 secs episode of BL arms twitching and R Leg up with difficulty speaking. Patient reports that she did not completely lose her awareness. Reports felt weird prior to this. No tongue biting, no loss of bladder or bowel.  Vitals   Vitals:   04/24/21 0743 04/24/21 0745 04/24/21 1158 04/24/21 1609  BP:  134/73 129/76 126/68  Pulse:  69 (!) 49 (!) 54  Resp:  17 16 (!) 22  Temp: 97.8 F (36.6 C)  98 F (36.7 C) (!) 97.5 F (36.4 C)  TempSrc: Oral  Oral Oral  SpO2:  100% 100% 100%  Weight:      Height:         Body mass index is 22.07 kg/m.  Physical Exam   General: Laying comfortably in bed; in no acute distress.  HENT: Normal oropharynx and mucosa. Normal external appearance of ears and nose.  Neck: Supple, no pain or tenderness  CV: No JVD. No peripheral edema.  Pulmonary: Symmetric Chest rise. Normal respiratory effort.  Abdomen: Soft to touch, non-tender.  Ext: No cyanosis, edema, or deformity  Skin: No rash. Normal palpation of skin.   Musculoskeletal: Normal digits and nails by inspection. No clubbing.   Neurologic Examination  Mental status/Cognition: Alert, oriented to self, place, month and year, good attention.  Speech/language: Fluent, comprehension intact, object naming intact, repetition intact.  Cranial nerves:   CN II Pupils equal and reactive to light, no VF deficits    CN III,IV,VI EOM intact, no gaze preference or deviation, no nystagmus    CN V normal sensation in V1, V2, and V3 segments bilaterally    CN VII no  asymmetry, no nasolabial fold flattening    CN VIII normal hearing to speech    CN IX & X normal palatal elevation, no uvular deviation    CN XI 5/5 head turn and 5/5 shoulder shrug bilaterally    CN XII midline tongue protrusion    Motor:  Muscle bulk: normal, tone normal, pronator drift none tremor none Mvmt Root Nerve  Muscle Right Left Comments  SA C5/6 Ax Deltoid 5 5   EF C5/6 Mc Biceps 5 5   EE C6/7/8 Rad Triceps 4+ 5   WF C6/7 Med FCR     WE C7/8 PIN ECU     F Ab C8/T1 U ADM/FDI 4+ 5   HF L1/2/3 Fem Illopsoas 5 5   KE L2/3/4 Fem Quad 5 5   DF L4/5 D Peron Tib Ant 5 5   PF S1/2 Tibial Grc/Sol 5 5    Reflexes:  Right Left Comments  Pectoralis      Biceps (C5/6) 2 2   Brachioradialis (C5/6) 2 2    Triceps (C6/7) 2 2    Patellar (L3/4) 2 2    Achilles (S1)      Hoffman      Plantar     Jaw jerk    Sensation:  Light touch Intact throughout   Pin prick    Temperature  Vibration   Proprioception    Coordination/Complex Motor:  - Finger to Nose intact BL - Heel to shin intact BL - Rapid alternating movement are normal - Gait: Stride length short. Arm swing poor. Base width narrow  Labs   Basic Metabolic Panel:  Lab Results  Component Value Date   NA 140 04/24/2021   K 4.2 04/24/2021   CO2 24 04/24/2021   GLUCOSE 140 (H) 04/24/2021   BUN 10 04/24/2021   CREATININE 0.66 04/24/2021   CALCIUM 9.8 04/24/2021   GFRNONAA >60 04/24/2021   GFRAA 87 03/14/2020   HbA1c:  Lab Results  Component Value Date   HGBA1C 6.0 03/20/2021   LDL:  Lab Results  Component Value Date   LDLCALC 62 03/14/2020   Urine Drug Screen: No results found for: LABOPIA, COCAINSCRNUR, LABBENZ, AMPHETMU, THCU, LABBARB  Alcohol Level No results found for: ETH No results found for: PHENYTOIN, ZONISAMIDE, LAMOTRIGINE, LEVETIRACETA No results found for: PHENYTOIN, PHENOBARB, VALPROATE, CBMZ  Imaging and Diagnostic studies   MRI Brain w + w/o Contrast: Increased enhancement and  vasogenic edema at the treated left frontal and left parietal metastases.    Impression   Hannah [name] is a 70 y.o. female with with new onset seizures in the setting of worsening edema on MRI brain related to metastatic lesions. Started on Keppra and decadron. Had a brief seizure in AM. Will increase her Keppra dose and monitor overnight for further seizures.  Recommendations  - Keppra increased to 1500mg  BID. I changed it to PO from IV. - Monitor overnight for further seizures. Can be discharged tomorrow AM if no further seizures overnight. - continue decadron 6mg  Q12H. - No driving for 6 months and reiterated today - Follow up with Dr. Mickeal Skinner. _____________________________________________________________________  Plan discussed with patient and daughter at bedside and with dr. Broadus John over secure chat.   Thank you for the opportunity to take part in the care of this patient. If you have any further questions, please contact the neurology consultation attending.  Signed,  [place] Pager Number [phone]

## 2021-04-24 NOTE — Progress Notes (Signed)
PROGRESS NOTE    [name]  BJY:[id number] DOB: [birth year] DOA: 04/22/2021 PCP: [name], MD   Chief Complaint  Patient presents with   Weakness    Brief Narrative:   Hannah [name] is a 70 y.o. 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  She was noted to have seizures, which is new onset, as well she had an episode noted in ED as well, he was given Keppra 1 g, as well started on Decadron, and admitted to [facility] for further management. -MRI brain noted increase in size of lesions with surrounding edema, likely related to post XRT necrosis  Assessment & Plan:   Seizures/altered mental status/vasogenic brain edema Brain metastasis -Prior history of XRT, SRS in 07/2020 and 11/2020  -MRI of the brain showing increasing size of the lesions(possibly related post stereotactic necrosis) with surrounding edema. -Continue with Keppra, and Decadron, Dr. Waldron Labs discussed with Dr. Mickeal Skinner, recommended discharge on Keppra and Decadron, he will arrange outpatient follow-up -Seizure precautions, add IV Ativan as needed, neurology following -PT eval   Fever/Leukopenia -had one initally fever reading of 100.4 at outside ED. in the setting of worsening leukopenia she was treated with empiric vancomycin and cefepime. -Clinically do not suspect meningitis  -Currently on IV cefepime due to initial concern for febrile neutropenia  -Blood cultures are negative X1 day, urine culture <10K colonies, flu and COVID PCR negative  -Afebrile since, discontinue  antibiotics tomorrow   Stage IV non-small cell right upper lobe lung cancer with brain metastasis  -diagnosed in February 2022 - s/p SRS, craniotomy and resection on solitary lesion in 07/2020 repeat SRS of two new brain mets 11/29/20  robot assisted right upper lobectomy on active chemotherapy -Followed by Dr. Earlie Server - Last chemotherapy session on 04/11/2021   HTN  -mildly elevated. Not on any hypertensives -add PRN IV labetalol for systolic greater than 086 and diastolic greater than 578   Aortic sclerosis with insufficiency -Mild to moderate seen on echo in March 2022.  Continue follow-up with cardiology outpatient   Hyperlipidemia Continue statin    Type 2 diabetes Controlled. Last hemoglobin A1c of 6 on 03/20/2021   DVT prophylaxis: SCD's Code Status: Full Family Communication: D/W daughter at bedside Disposition:   Status is: inpatient Inpatient appropriate due to severity of illness      Consultants:  Neurology D/w Neuro-oncology, recommendation for Keppra and Decadron, and Dr. Mickeal Skinner will arrange outpatient follow-up next week.  Subjective: -Episode of blank stare, arm stiffness earlier this morning, now resolved  Objective: Vitals:   04/24/21 0730 04/24/21 0743 04/24/21 0745 04/24/21 1158  BP:   134/73 129/76  Pulse: 60  69 (!) 49  Resp: 17  17 16   Temp:  97.8 F (36.6 C)  98 F (36.7 C)  TempSrc:  Oral  Oral  SpO2: 100%  100% 100%  Weight:      Height:        Intake/Output Summary (Last 24 hours) at 04/24/2021 1248 Last data filed at 04/24/2021 0841 Gross per 24 hour  Intake 428.68 ml  Output --  Net 428.68 ml  Filed Weights   04/22/21 1644  Weight: 51.3 kg    Examination:  Gen: Awake, Alert, Oriented X 3, no distress HEENT: no JVD Lungs: Good air movement bilaterally, CTAB CVS: S1S2/RRR Abd: soft, Non tender, non distended, BS present Extremities: No edema Neuro: Moves all extremities, no localizing signs  Data Reviewed: I have  personally reviewed following labs and imaging studies  CBC: Recent Labs  Lab 04/22/21 1700 04/23/21 0410 04/24/21 0352  WBC 3.1* 1.8* 3.0*  HGB 9.2* 9.6* 8.8*  HCT 29.7* 30.4* 28.7*  MCV 88.7 87.9 89.4  PLT 206 221 387    Basic Metabolic Panel: Recent Labs  Lab 04/22/21 1700 04/23/21 0410 04/24/21 0352  NA 136 136 140  K 4.2 4.1 4.2  CL 100 102 108  CO2 28 26 24   GLUCOSE 108* 160* 140*  BUN 11 7* 10  CREATININE 0.63 0.66 0.66  CALCIUM 9.7 9.8 9.8    GFR: Estimated Creatinine Clearance: 45.7 mL/min (by C-G formula based on SCr of 0.66 mg/dL).  Liver Function Tests: Recent Labs  Lab 04/24/21 0352  AST 22  ALT 14  ALKPHOS 61  BILITOT 0.4  PROT 6.4*  ALBUMIN 2.8*    CBG: Recent Labs  Lab 04/22/21 1657 04/22/21 1659 04/22/21 1905  GLUCAP 56* 107* 105*     Recent Results (from the past 240 hour(s))  Resp Panel by RT-PCR (Flu A&B, Covid) Nasopharyngeal Swab     Status: None   Collection Time: 04/22/21  8:09 PM   Specimen: Nasopharyngeal Swab; Nasopharyngeal(NP) swabs in vial transport medium  Result Value Ref Range Status   SARS Coronavirus 2 by RT PCR NEGATIVE NEGATIVE Final    Comment: (NOTE) SARS-CoV-2 target nucleic acids are NOT DETECTED.  The SARS-CoV-2 RNA is generally detectable in upper respiratory specimens during the acute phase of infection. The lowest concentration of SARS-CoV-2 viral copies this assay can detect is 138 copies/mL. A negative result does not preclude SARS-Cov-2 infection and should not be used as the sole basis for treatment or other patient management decisions. A negative result may occur with  improper specimen collection/handling, submission of specimen other than nasopharyngeal swab, presence of viral mutation(s) within the areas targeted by this assay, and inadequate number of viral copies(<138 copies/mL). A negative result must be combined with clinical observations, patient history, and  epidemiological information. The expected result is Negative.  Fact Sheet for Patients:  EntrepreneurPulse.com.au  Fact Sheet for Healthcare Providers:  IncredibleEmployment.be  This test is no t yet approved or cleared by the [place] FDA and  has been authorized for detection and/or diagnosis of SARS-CoV-2 by FDA under an Emergency Use Authorization (EUA). This EUA will remain  in effect (meaning this test can be used) for the duration of the COVID-19 declaration under Section 564(b)(1) of the Act, 21 U.S.C.section 360bbb-3(b)(1), unless the authorization is terminated  or revoked sooner.       Influenza A by PCR NEGATIVE NEGATIVE Final   Influenza B by PCR NEGATIVE NEGATIVE Final    Comment: (NOTE) The Xpert Xpress SARS-CoV-2/FLU/RSV plus assay is intended as an aid in the diagnosis of influenza from Nasopharyngeal swab specimens and should not be used as a sole basis for treatment. Nasal washings and aspirates are unacceptable for Xpert Xpress SARS-CoV-2/FLU/RSV testing.  Fact Sheet for Patients: EntrepreneurPulse.com.au  Fact Sheet for Healthcare Providers: IncredibleEmployment.be  This test is not yet approved or cleared by the [place] FDA and has been authorized for detection and/or diagnosis of  SARS-CoV-2 by FDA under an Emergency Use Authorization (EUA). This EUA will remain in effect (meaning this test can be used) for the duration of the COVID-19 declaration under Section 564(b)(1) of the Act, 21 U.S.C. section 360bbb-3(b)(1), unless the authorization is terminated or revoked.  Performed at [facility], [address] [place], [place] [address]   Culture, blood (routine x 2)     Status: None (Preliminary result)   Collection Time: 04/23/21  2:10 AM   Specimen: BLOOD RIGHT FOREARM  Result Value Ref Range Status   Specimen Description BLOOD RIGHT FOREARM   Final   Special Requests   Final    BOTTLES DRAWN AEROBIC AND ANAEROBIC BACTEROIDES CACCAE   Culture   Final    NO GROWTH 1 DAY Performed at [facility], [place] [address], [place], [place] [address]    Report Status PENDING  Incomplete  Culture, blood (routine x 2)     Status: None (Preliminary result)   Collection Time: 04/23/21  2:19 AM   Specimen: BLOOD  Result Value Ref Range Status   Specimen Description BLOOD LEFT ANTECUBITAL  Final   Special Requests   Final    BOTTLES DRAWN AEROBIC AND ANAEROBIC Blood Culture adequate volume   Culture   Final    NO GROWTH 1 DAY Performed at [facility], [place] [address], [place], [place] [address]    Report Status PENDING  Incomplete  Urine Culture     Status: Abnormal   Collection Time: 04/23/21  2:38 AM   Specimen: Urine, Clean Catch  Result Value Ref Range Status   Specimen Description URINE, CLEAN CATCH  Final   Special Requests NONE  Final   Culture (A)  Final    <10,000 COLONIES/mL INSIGNIFICANT GROWTH Performed at [facility], [place] [address], [place],  [address]    Report Status 04/23/2021 FINAL  Final         Radiology Studies: CT Head Wo Contrast  Result Date: 04/22/2021 CLINICAL DATA:  Initial evaluation for acute mental status change, history of metastatic lung carcinoma with brain involvement. Status post chemotherapy. EXAM: CT HEAD WITHOUT CONTRAST TECHNIQUE: Contiguous axial images were obtained from the base of the skull through the vertex without intravenous contrast. COMPARISON:  Previous CT from 02/13/2021 and brain MRI from 02/18/2021. FINDINGS: Brain: Postoperative changes from previous left frontal craniotomy are seen. Vasogenic edema involving the anterior left frontal lobe about the surgical resection site has progressed and worsened from prior exam, concerning for progressive disease. Additionally, there is progressive vasogenic edema at the left parieto-occipital region, presumably  related to a previously identified subcentimeter metastatic implant at this location, also concerning for progressive disease. No other significant vasogenic edema. No significant midline shift. No hydrocephalus or ventricular trapping. Basilar cisterns remain patent. No definite new lesions evident by CT. No acute intracranial hemorrhage. No other visible acute large vessel territory infarct. No extra-axial fluid collection. Vascular: No hyperdense vessel. Skull: Prior left frontal craniotomy. Calvarium otherwise intact. No scalp soft tissue abnormality. Sinuses/Orbits: Globes and orbital soft tissues demonstrate no acute finding. Left sphenoid sinus retention cyst noted. Paranasal sinuses and mastoid air cells are otherwise clear. Other: None. IMPRESSION: 1. Postoperative changes from previous left frontal craniotomy. Progressive vasogenic edema involving the anterior left frontal lobe about the surgical resection site, concerning for progressive disease. If the patient has undergone recent XRT, possible post radiation changes could also be considered. 2. Progressive vasogenic edema at the left parieto-occipital  region, presumably related to a previously identified subcentimeter metastatic implant at this location, also concerning for progressive disease versus post XRT changes. 3. No other new acute intracranial abnormality. Electronically Signed   By: Jeannine Boga M.D.   On: 04/22/2021 21:02   MR Brain W and Wo Contrast  Result Date: 04/23/2021 CLINICAL DATA:  Mental status change with unknown cause. History of brain metastases. EXAM: MRI HEAD WITHOUT AND WITH CONTRAST TECHNIQUE: Multiplanar, multiecho pulse sequences of the brain and surrounding structures were obtained without and with intravenous contrast. CONTRAST:  4.32mL GADAVIST GADOBUTROL 1 MMOL/ML IV SOLN COMPARISON:  Head CT from yesterday.  Brain MRI 02/18/2021 FINDINGS: Brain: Hazy, ill-defined masslike enhancement in the high left  frontal lobe subjacent to the craniotomy, progressed to 19 mm in area on coronal postcontrast imaging. Significant interval increase in brain edema. More mildly increased size of enhancing 7 mm metastasis in the left parietal lobe, also with attendant increase in edema. No infarct, hemorrhage, hydrocephalus, or collection. Vascular: Normal flow voids and vascular enhancement Skull and upper cervical spine: Normal marrow signal. Cervical facet spurring. Sinuses/Orbits: Negative IMPRESSION: Increased enhancement and vasogenic edema at the treated left frontal and left parietal metastases. Electronically Signed   By: Jorje Guild M.D.   On: 04/23/2021 04:45   DG Chest Portable 1 View  Result Date: 04/22/2021 CLINICAL DATA:  Evaluate for infection. EXAM: PORTABLE CHEST 1 VIEW COMPARISON:  Chest radiograph dated 02/12/2021 and CT dated 03/14/2021. FINDINGS: Postsurgical changes of the right lung with persistent tenting of the right hemidiaphragm and small right pleural effusion similar to prior exams. The left lung is clear. No pneumothorax. Stable cardiac silhouette. No acute osseous pathology. IMPRESSION: Stable postsurgical changes of the right lung with small right pleural effusion. No new consolidation. Electronically Signed   By: Anner Crete M.D.   On: 04/22/2021 20:44   EEG adult  Result Date: 04/23/2021 Lora Havens, MD     04/23/2021 11:33 AM Patient Name: WANETTE ROBISON MRN: [id number] Epilepsy Attending: Lora Havens Referring Physician/Provider: Dr Ileene Musa Date: 04/23/2021 Duration: 23.58 mins Patient history: 70 year old female with new onset seizures in the setting of worsening edema related to metastatic lesions.  EEG to evaluate for seizure. Level of alertness: Awake AEDs during EEG study: LEV Technical aspects: This EEG study was done with scalp electrodes positioned according to the 10-20 International system of electrode placement. Electrical activity was acquired at a  sampling rate of 500Hz  and reviewed with a high frequency filter of 70Hz  and a low frequency filter of 1Hz . EEG data were recorded continuously and digitally stored. Description: The posterior dominant rhythm consists of 9 Hz activity of moderate voltage (25-35 uV) seen predominantly in posterior head regions, symmetric and reactive to eye opening and eye closing. EEG showed lateralized periodic discharges in right hemisphere, maximal right frontal region every 2 to 3 seconds as well as intermittent 2 to 3 Hz delta slowing in right hemisphere, maximal right frontal region. Photic driving was not seen during photic stimulation.  Hyperventilation was not performed.   ABNORMALITY - Lateralized periodic discharges, right hemisphere, maximal right frontal region. -Intermittent slow,right hemisphere, maximal right frontal region. IMPRESSION: This study showed evidence of epileptogenicity as well as cortical dysfunction in right hemisphere, maximal right frontal region likely due to underlying structural abnormality/metastatic disease/edema.  No seizures were seen throughout the recording. Priyanka Barbra Sarks    Scheduled Meds:  acetaminophen  1,000 mg Oral Q6H   vitamin C  1,000  mg Oral Daily   citalopram  10 mg Oral Daily   dexamethasone  6 mg Oral F58I   folic acid  1 mg Oral Daily   mirtazapine  45 mg Oral QHS   pantoprazole  40 mg Oral Daily   polyethylene glycol  17 g Oral BID   polyvinyl alcohol  1-2 drop Both Eyes Daily   pravastatin  40 mg Oral QPM   Continuous Infusions:  ceFEPime (MAXIPIME) IV 2 g (04/24/21 1241)   levETIRAcetam 1,000 mg (04/24/21 0836)     LOS: 1 day   Domenic Polite, MD [facility] .  04/24/2021, 12:48 PM

## 2021-04-24 NOTE — Evaluation (Signed)
Physical Therapy Evaluation and Discharge Patient Details Name: Hannah [name] MRN: [id number] DOB: 12/16/1950 Today's Date: 04/24/2021  History of Present Illness  Pt is a 70 y/o female who presents with aphasia and new onset seizures. 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   Clinical Impression  Patient evaluated by Physical Therapy with no further acute PT needs identified. All education has been completed and the patient has no further questions. At the time of PT eval pt was able to perform transfers and ambulation with gross independence to modified independence and no AD. Pt ambulated ~300' during session without difficulty or any noted dyspnea. VSS throughout OOB mobility. Mild coordination deficits noted with fine motor of the hands. Overall pt does not require further acute skilled PT services, and pt reports feeling comfortable and confident in return home with family support. See below for any follow-up Physical Therapy or equipment needs. PT is signing off. Thank you for this referral.        Recommendations for follow up therapy are one component of a multi-disciplinary discharge planning process, led by the attending physician.  Recommendations may be updated based on patient status, additional functional criteria and insurance authorization.  Follow Up Recommendations No PT follow up    Assistance Recommended at Discharge Intermittent Supervision/Assistance  Functional Status Assessment Patient has not had a recent decline in their functional status  Equipment Recommendations  None recommended by PT    Recommendations for Other Services       Precautions / Restrictions  Precautions Precautions: Other (comment) (seizure) Restrictions Weight Bearing Restrictions: No      Mobility  Bed Mobility Overal bed mobility: Independent                  Transfers Overall transfer level: Independent Equipment used: None                    Ambulation/Gait Ambulation/Gait assistance: Modified independent (Device/Increase time) Gait Distance (Feet): 300 Feet Assistive device: None Gait Pattern/deviations: WFL(Within Functional Limits) Gait velocity: Decreased Gait velocity interpretation: >2.62 ft/sec, indicative of community ambulatory General Gait Details: Mild decreased gait speed. Overall no unsteadiness or LOB noted.  Stairs            Wheelchair Mobility    Modified Rankin (Stroke Patients Only) Modified Rankin (Stroke Patients Only) Pre-Morbid Rankin Score: No significant disability Modified Rankin: No significant disability     Balance Overall balance assessment: Modified Independent                                           Pertinent Vitals/Pain Pain Assessment: No/denies pain    Home Living Family/patient expects to be discharged to:: Private residence Living Arrangements: Spouse/significant other Available Help at Discharge: Family;Available 24 hours/day Type of Home: House Home Access: Stairs to enter Entrance Stairs-Rails: Right;Left;Can reach both Entrance Stairs-Number of Steps: 3   Home Layout: One level Home Equipment: Passenger transport manager (2 wheels)      Prior Function Prior Level of Function : Independent/Modified Independent               ADLs Comments: Sit  down in the tub to bathe     Hand Dominance   Dominant Hand: Right    Extremity/Trunk Assessment   Upper Extremity Assessment Upper Extremity Assessment: RUE deficits/detail;LUE deficits/detail RUE Deficits / Details: Bilaterally, 5/5 strength with shoulder flexion, biceps/triceps. Pt reports sensation equal R and  L. Noted mild coordination deficit with opposition. LUE Sensation: WNL LUE Coordination: decreased fine motor    Lower Extremity Assessment Lower Extremity Assessment: RLE deficits/detail;LLE deficits/detail RLE Deficits / Details: Bilaterally, 5/5 strength in quads, hamstrings, hip flexors, ankle DF. No coordination difficulties noted. Pt reports no sensation changes. LLE Sensation: WNL LLE Coordination: WNL    Cervical / Trunk Assessment Cervical / Trunk Assessment: Other exceptions Cervical / Trunk Exceptions: Forward head posture with rounded shoulders  Communication   Communication: No difficulties  Cognition Arousal/Alertness: Awake/alert Behavior During Therapy: WFL for tasks assessed/performed Overall Cognitive Status: Within Functional Limits for tasks assessed                                          General Comments      Exercises     Assessment/Plan    PT Assessment Patient does not need any further PT services  PT Problem List         PT Treatment Interventions      PT Goals (Current goals can be found in the Care Plan section)  Acute Rehab PT Goals Patient Stated Goal: Return home - enjoys cleaning her house PT Goal Formulation: All assessment and education complete, DC therapy    Frequency     Barriers to discharge        Co-evaluation               AM-PAC PT "6 Clicks" Mobility  Outcome Measure Help needed turning from your back to your side while in a flat bed without using bedrails?: None Help needed moving from lying on your back to sitting on the side of a flat bed without using bedrails?: None Help needed moving to and from a bed to a chair (including a wheelchair)?: None Help needed standing up from a chair using your arms (e.g., wheelchair or bedside chair)?: None Help needed to walk in hospital room?: None Help needed climbing 3-5 steps with a railing? : None 6 Click Score: 24    End of Session   Activity  Tolerance: Patient tolerated treatment well Patient left: in bed;with call bell/phone within reach;with family/visitor present Nurse Communication: Mobility status PT Visit Diagnosis: Other symptoms and signs involving the nervous system (R29.898)    Time: 1400-1433 PT Time Calculation (min) (ACUTE ONLY): 33 min   Charges:   PT Evaluation $PT Eval Low Complexity: 1 Low PT Treatments $Gait Training: 8-22 mins        Rolinda Roan, PT, DPT Acute Rehabilitation Services Pager: [phone] Office: [phone]   Thelma Comp 04/24/2021, 2:49 PM

## 2021-04-24 NOTE — Evaluation (Signed)
Occupational Therapy Evaluation Patient Details Name: Hannah [name] MRN: [id number] DOB: 1950-07-21 Today's Date: 04/24/2021   History of Present Illness Pt is a 70 y/o female who presents with aphasia and new onset seizures. 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   Clinical Impression   Pt admitted for concerns listed above. PTA pt reported that she was independent with all ADL's and IADL's. At this time pt can complete all ADL's independently, however will benefit from supervision for bathing and IADL tasks due to cognitive concerns and seizure precautions. Pt presents with decreased safety awareness and awareness of her deficits, as well difficulty with perception/clock draw task. Her and her daughter were educated on compensatory strategies at home as well as safety measures, to insure safety with medication management, cooking, and bathing. OT will follow acutely.       Recommendations for follow up therapy are one component of a multi-disciplinary discharge planning process, led by the attending physician.  Recommendations may be updated based on patient status, additional functional criteria and insurance authorization.   Follow Up Recommendations  No OT follow up    Assistance Recommended at Discharge Intermittent Supervision/Assistance  Functional Status Assessment  Patient has had a recent decline in their functional status and demonstrates the ability to make significant improvements in function in a reasonable and predictable amount of time.  Equipment Recommendations  None recommended by OT    Recommendations for Other Services       Precautions / Restrictions Precautions Precautions: Other  (comment) (seizure) Restrictions Weight Bearing Restrictions: No      Mobility Bed Mobility Overal bed mobility: Independent                  Transfers Overall transfer level: Independent Equipment used: None                      Balance Overall balance assessment: Modified Independent                                         ADL either performed or assessed with clinical judgement   ADL Overall ADL's : At baseline                                       General ADL Comments: Pt has no difficulties completing ADL's, however due to some cognitive concerns and seizure precautions, pt will benefit from supervision, especially with bathing and IADL's.     Vision Baseline Vision/History: 1 Wears glasses Ability to See in Adequate Light: 0 Adequate Patient Visual Report: No change from baseline Vision Assessment?: No apparent visual deficits     Perception Perception Comments: Pt completed clock draw, placing the numbers backwards and counterclockwise, as well as spacially incorrect. Then when drawing the clock hands she was unable to differentiate between the short and long hand as well as placing them in the incorrect place.   Praxis      Pertinent Vitals/Pain Pain Assessment: No/denies pain     Hand Dominance Right   Extremity/Trunk Assessment  Upper Extremity Assessment Upper Extremity Assessment: Overall WFL for tasks assessed RUE Deficits / Details: Bilaterally, 5/5 strength with shoulder flexion, biceps/triceps. Pt reports sensation equal R and L. Noted mild coordination deficit with opposition. LUE Sensation: WNL LUE Coordination: decreased fine motor   Lower Extremity Assessment Lower Extremity Assessment: Defer to PT evaluation   Cervical / Trunk Assessment Cervical / Trunk Assessment: Other exceptions Cervical / Trunk Exceptions: Forward head posture with rounded shoulders   Communication  Communication Communication: No difficulties   Cognition Arousal/Alertness: Awake/alert Behavior During Therapy: WFL for tasks assessed/performed Overall Cognitive Status: Impaired/Different from baseline Area of Impairment: Safety/judgement;Awareness;Problem solving                         Safety/Judgement: Decreased awareness of safety;Decreased awareness of deficits Awareness: Intellectual Problem Solving: Slow processing General Comments: Pt completed clock draw this session and placed the numbers backwards and counterclockwise as well placed the clock hands in the wrong place.     General Comments       Exercises     Shoulder Instructions      Home Living Family/patient expects to be discharged to:: Private residence Living Arrangements: Spouse/significant other Available Help at Discharge: Family;Available 24 hours/day Type of Home: House Home Access: Stairs to enter [employer] of Steps: 3 Entrance Stairs-Rails: Right;Left;Can reach both Home Layout: One level     Bathroom Shower/Tub: Teacher, early years/pre: Standard Bathroom Accessibility: Yes   Home Equipment: Passenger transport manager (2 wheels)          Prior Functioning/Environment Prior Level of Function : Independent/Modified Independent               ADLs Comments: Sit down in the tub to bathe        OT Problem List: Decreased activity tolerance;Impaired balance (sitting and/or standing);Impaired vision/perception      OT Treatment/Interventions: Energy conservation;Therapeutic activities;Cognitive remediation/compensation;Visual/perceptual remediation/compensation;Patient/family education;Balance training    OT Goals(Current goals can be found in the care plan section) Acute Rehab OT Goals Patient Stated Goal: To go home OT Goal Formulation: With patient/family Time For Goal Achievement: 05/08/21 Potential to Achieve Goals: Good ADL Goals Additional ADL Goal  #1: Pt will complete pathfinding activity with minimal cuing. Additional ADL Goal #2: Pt will complete pill box task with no mistakes  OT Frequency: Min 2X/week   Barriers to D/C:            Co-evaluation              AM-PAC OT "6 Clicks" Daily Activity     Outcome Measure Help from another person eating meals?: None Help from another person taking care of personal grooming?: None Help from another person toileting, which includes using toliet, bedpan, or urinal?: None Help from another person bathing (including washing, rinsing, drying)?: None Help from another person to put on and taking off regular upper body clothing?: None Help from another person to put on and taking off regular lower body clothing?: None 6 Click Score: 24   End of Session Equipment Utilized During Treatment: Gait belt Nurse Communication: Mobility status  Activity Tolerance: Patient tolerated treatment well Patient left: in bed;with call bell/phone within reach;with family/visitor present  OT Visit Diagnosis: Other symptoms and signs involving cognitive function                Time: 7322-0254 OT Time Calculation (min): 32 min Charges:  OT General Charges $OT Visit: 1 Visit OT  Evaluation $OT Eval Moderate Complexity: 1 Mod OT Treatments $Self Care/Home Management : 8-22 mins  Daryana Whirley H., OTR/L Acute Rehabilitation  Karolyna Bianchini Elane Yolanda Bonine 04/24/2021, 6:11 PM

## 2021-04-25 DIAGNOSIS — I10 Essential (primary) hypertension: Secondary | ICD-10-CM | POA: Diagnosis not present

## 2021-04-25 LAB — BASIC METABOLIC PANEL
Anion gap: 6 (ref 5–15)
BUN: 12 mg/dL (ref 8–23)
CO2: 26 mmol/L (ref 22–32)
Calcium: 10 mg/dL (ref 8.9–10.3)
Chloride: 107 mmol/L (ref 98–111)
Creatinine, Ser: 0.69 mg/dL (ref 0.44–1.00)
GFR, Estimated: >60 mL/min (ref >60)
Glucose, Bld: 148 mg/dL — ABNORMAL HIGH (ref 70–99)
Potassium: 4.4 mmol/L (ref 3.5–5.1)
Sodium: 139 mmol/L (ref 135–145)

## 2021-04-25 LAB — CBC
HCT: 29.7 % — ABNORMAL LOW (ref 36.0–46.0)
Hemoglobin: 9.1 g/dL — ABNORMAL LOW (ref 12.0–15.0)
MCH: 27.6 pg (ref 26.0–34.0)
MCHC: 30.6 g/dL (ref 30.0–36.0)
MCV: 90 fL (ref 80.0–100.0)
Platelets: 228 10*3/uL (ref 150–400)
RBC: 3.3 MIL/uL — ABNORMAL LOW (ref 3.87–5.11)
RDW: 13.8 % (ref 11.5–15.5)
WBC: 5.9 10*3/uL (ref 4.0–10.5)
nRBC: 0 % (ref 0.0–0.2)

## 2021-04-25 MED ORDER — ACETAMINOPHEN 325 MG PO TABS
650.0000 mg | ORAL_TABLET | Freq: Four times a day (QID) | ORAL | Status: DC | PRN
  Administered 2021-04-26 – 2021-04-28 (×2): 650 mg via ORAL
  Filled 2021-04-25 (×2): qty 2

## 2021-04-25 NOTE — Progress Notes (Signed)
Pt stated that she had an unwitnessed seizure.felt as if " a bucket of cold water was thrown on her" DR Broadus John notified an went bedside

## 2021-04-25 NOTE — Progress Notes (Signed)
PROGRESS NOTE    Hannah [name]  NOB:[id number] DOB: 02-16-1951 DOA: 04/22/2021 PCP: [name], MD   Chief Complaint  Patient presents with   Weakness    Brief Narrative:   Hannah [name] is a 70 y.o. 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  She was noted to have seizures, which is new onset, as well she had an episode noted in ED as well, he was given Keppra 1 g, as well started on Decadron, and admitted to [facility] for further management. -MRI brain noted increase in size of lesions with surrounding edema, likely related to post XRT necrosis  Assessment & Plan:   Seizures/altered mental status/vasogenic brain edema Brain metastasis -Prior history of XRT, SRS in 07/2020 and 11/2020  -MRI of the brain showing increasing size of the lesions(possibly related post stereotactic necrosis) with surrounding edema. -Continue with Keppra, and Decadron, Dr. Waldron Labs discussed with Dr. Mickeal Skinner, recommended discharge on Keppra and Decadron, he will arrange outpatient follow-up -Neurology increased Keppra dose last night, continue current dosing -Another episode of transient seizure-like episode earlier this morning, continue to monitor on current dose of Keppra and Decadron   Fever/Leukopenia -had one initally fever reading of 100.4 at outside ED. in the setting of worsening leukopenia she was treated with empiric vancomycin and cefepime. -Clinically do not suspect meningitis  -Currently on IV cefepime due to initial concern for febrile neutropenia   -Blood cultures are negative X2 days, urine culture <10K colonies, flu and COVID PCR negative  -Afebrile since, discontinue antibiotics today  Stage IV non-small cell right upper lobe lung cancer with brain metastasis  -diagnosed in February 2022 - s/p SRS, craniotomy and resection on solitary lesion in 07/2020 repeat SRS of two new brain mets 11/29/20  robot assisted right upper lobectomy on active chemotherapy -Followed by Dr. Earlie Server - Last chemotherapy session on 04/11/2021   HTN  -mildly elevated. Not on any hypertensives -add PRN IV labetalol for systolic greater than 947 and diastolic greater than 654   Aortic sclerosis with insufficiency -Mild to moderate seen on echo in March 2022.  Continue follow-up with cardiology outpatient   Hyperlipidemia Continue statin    Type 2 diabetes Controlled. Last hemoglobin A1c of 6 on 03/20/2021   DVT prophylaxis: SCD's Code Status: Full Family Communication: D/W daughter at bedside yesterday Disposition:   Status is: inpatient Inpatient appropriate due to severity of illness      Consultants:  Neurology D/w Neuro-oncology, recommendation for Keppra and Decadron, and Dr. Mickeal Skinner will arrange outpatient follow-up next week.  Subjective: -Reports having a  seizure-like episode this morning which was transient  Objective: Vitals:   04/24/21 2349 04/25/21 0459 04/25/21 0822 04/25/21 1155  BP: 133/76 (!) 152/7 140/63 137/76  Pulse: (!) 47 (!) 51 64 62  Resp: 18 16 18 17   Temp: 98 F (36.7 C) 98.3 F (36.8 C) 97.6 F (36.4 C) 97.7 F (36.5 C)  TempSrc: Oral Oral Oral Oral  SpO2: 100% 100% 100% 100%  Weight:      Height:        Intake/Output Summary (Last 24  hours) at 04/25/2021 1205 Last data filed at 04/25/2021 1158 Gross per 24 hour  Intake 1340.33 ml  Output 0 ml  Net 1340.33 ml   Filed Weights   04/22/21 1644  Weight: 51.3 kg    Examination:  Gen: Awake, Alert, Oriented X 3,  HEENT: no JVD Lungs: Good  air movement bilaterally, CTAB CVS: S1S2/RRR Abd: soft, Non tender, non distended, BS present Extremities: No edema Skin: no new rashes on exposed skin  Data Reviewed: I have personally reviewed following labs and imaging studies  CBC: Recent Labs  Lab 04/22/21 1700 04/23/21 0410 04/24/21 0352 04/25/21 0325  WBC 3.1* 1.8* 3.0* 5.9  HGB 9.2* 9.6* 8.8* 9.1*  HCT 29.7* 30.4* 28.7* 29.7*  MCV 88.7 87.9 89.4 90.0  PLT 206 221 213 270    Basic Metabolic Panel: Recent Labs  Lab 04/22/21 1700 04/23/21 0410 04/24/21 0352 04/25/21 0325  NA 136 136 140 139  K 4.2 4.1 4.2 4.4  CL 100 102 108 107  CO2 28 26 24 26   GLUCOSE 108* 160* 140* 148*  BUN 11 7* 10 12  CREATININE 0.63 0.66 0.66 0.69  CALCIUM 9.7 9.8 9.8 10.0    GFR: Estimated Creatinine Clearance: 45.7 mL/min (by C-G formula based on SCr of 0.69 mg/dL).  Liver Function Tests: Recent Labs  Lab 04/24/21 0352  AST 22  ALT 14  ALKPHOS 61  BILITOT 0.4  PROT 6.4*  ALBUMIN 2.8*    CBG: Recent Labs  Lab 04/22/21 1657 04/22/21 1659 04/22/21 1905  GLUCAP 56* 107* 105*     Recent Results (from the past 240 hour(s))  Resp Panel by RT-PCR (Flu A&B, Covid) Nasopharyngeal Swab     Status: None   Collection Time: 04/22/21  8:09 PM   Specimen: Nasopharyngeal Swab; Nasopharyngeal(NP) swabs in vial transport medium  Result Value Ref Range Status   SARS Coronavirus 2 by RT PCR NEGATIVE NEGATIVE Final    Comment: (NOTE) SARS-CoV-2 target nucleic acids are NOT DETECTED.  The SARS-CoV-2 RNA is generally detectable in upper respiratory specimens during the acute phase of infection. The lowest concentration of SARS-CoV-2 viral copies this assay can detect is 138 copies/mL. A negative result does not preclude SARS-Cov-2 infection and should not be used as the sole basis for treatment or other patient management decisions. A negative result may occur with  improper specimen collection/handling, submission of specimen  other than nasopharyngeal swab, presence of viral mutation(s) within the areas targeted by this assay, and inadequate number of viral copies(<138 copies/mL). A negative result must be combined with clinical observations, patient history, and epidemiological information. The expected result is Negative.  Fact Sheet for Patients:  EntrepreneurPulse.com.au  Fact Sheet for Healthcare Providers:  IncredibleEmployment.be  This test is no t yet approved or cleared by the [place] FDA and  has been authorized for detection and/or diagnosis of SARS-CoV-2 by FDA under an Emergency Use Authorization (EUA). This EUA will remain  in effect (meaning this test can be used) for the duration of the COVID-19 declaration under Section 564(b)(1) of the Act, 21 U.S.C.section 360bbb-3(b)(1), unless the authorization is terminated  or revoked sooner.       Influenza A by PCR NEGATIVE NEGATIVE Final   Influenza B by PCR NEGATIVE NEGATIVE Final    Comment: (NOTE) The Xpert Xpress SARS-CoV-2/FLU/RSV plus assay is intended as an aid in the diagnosis of influenza from Nasopharyngeal swab specimens and should not be used as a sole basis for treatment. Nasal washings and  aspirates are unacceptable for Xpert Xpress SARS-CoV-2/FLU/RSV testing.  Fact Sheet for Patients: EntrepreneurPulse.com.au  Fact Sheet for Healthcare Providers: IncredibleEmployment.be  This test is not yet approved or cleared by the [place] FDA and has been authorized for detection and/or diagnosis of SARS-CoV-2 by FDA under an Emergency Use Authorization (EUA). This EUA will remain in effect (meaning this test can be used) for the duration of the COVID-19 declaration under Section 564(b)(1) of the Act, 21 U.S.C. section 360bbb-3(b)(1), unless the authorization is terminated or revoked.  Performed at [facility], [address] [place], [place] [address]   Culture, blood (routine x 2)     Status: None (Preliminary result)   Collection Time: 04/23/21  2:10 AM   Specimen: BLOOD RIGHT FOREARM  Result Value Ref Range Status   Specimen Description BLOOD RIGHT FOREARM  Final   Special Requests   Final    BOTTLES DRAWN AEROBIC AND ANAEROBIC BACTEROIDES CACCAE   Culture   Final    NO GROWTH 2 DAYS Performed at [facility], [place] [address], [place], [place] [address]    Report Status PENDING  Incomplete  Culture, blood (routine x 2)     Status: None (Preliminary result)   Collection Time: 04/23/21  2:19 AM   Specimen: BLOOD  Result Value Ref Range Status   Specimen Description BLOOD LEFT ANTECUBITAL  Final   Special Requests   Final    BOTTLES DRAWN AEROBIC AND ANAEROBIC Blood Culture adequate volume   Culture   Final    NO GROWTH 2 DAYS Performed at [facility], [place] [address] [place], [place] [address]    Report Status PENDING  Incomplete  Urine Culture     Status: Abnormal   Collection Time: 04/23/21  2:38 AM   Specimen: Urine, Clean Catch  Result Value Ref Range Status   Specimen Description URINE, CLEAN CATCH  Final   Special Requests NONE  Final   Culture (A)  Final    <10,000 COLONIES/mL INSIGNIFICANT GROWTH Performed at [facility], [place] [address], [place], [place] [address]    Report Status 04/23/2021 FINAL  Final    Radiology Studies: No results found.  Scheduled Meds:  acetaminophen  1,000 mg Oral Q6H   vitamin C  1,000 mg Oral Daily   citalopram  10 mg Oral Daily   dexamethasone  6 mg Oral Q12H   feeding supplement  237 mL Oral BID BM   folic acid  1 mg Oral Daily   levETIRAcetam  1,500 mg Oral BID   mirtazapine  45 mg Oral QHS   multivitamin with minerals  1 tablet Oral Daily   pantoprazole  40 mg Oral Daily   polyethylene glycol  17 g Oral BID   polyvinyl alcohol  1-2 drop Both Eyes Daily   pravastatin  40 mg Oral QPM   Continuous Infusions:     LOS:  2 days   Domenic Polite, MD [facility] .  04/25/2021, 12:05 PM

## 2021-04-25 NOTE — Progress Notes (Signed)
Occupational Therapy Treatment Patient Details Name: Hannah [name] MRN: [id number] DOB: [birth year] Today's Date: 04/25/2021   History of present illness Pt is a 70 y/o female who presents with aphasia and new onset seizures. 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   OT comments  Pt very limited by fatigue this session. She sat up and reported that she felt completely "give out". Pt and family educated on safety measures at home, potential options for additional caregivers, and overall pt's safety with mobility and ADL's at this time. OT will continue to follow up and address cognition as pt is able to tolerate further mobility and longer session.    Recommendations for follow up therapy are one component of a multi-disciplinary discharge planning process, led by the attending physician.  Recommendations may be updated based on patient status, additional functional criteria and insurance authorization.    Follow Up Recommendations  No OT follow up    Assistance Recommended at Discharge Intermittent Supervision/Assistance  Equipment Recommendations  None recommended by OT    Recommendations for Other Services      Precautions / Restrictions Precautions Precautions: Other (comment) Restrictions Weight Bearing Restrictions: No       Mobility Bed Mobility Overal bed mobility: Independent                  Transfers                   General transfer comment: Deferred this session due to increased fatigue     Balance                                           ADL either performed or assessed with clinical judgement   ADL Overall ADL's : At baseline                                        General ADL Comments: Pt contineus to be at baseline, needing supervision for safety due to cognition and seizure precautinos     Vision   Vision Assessment?: No apparent visual deficits   Perception     Praxis      Cognition Arousal/Alertness: Lethargic Behavior During Therapy: WFL for tasks assessed/performed Overall Cognitive Status: Impaired/Different from baseline Area of Impairment: Safety/judgement;Awareness;Problem solving                         Safety/Judgement: Decreased awareness of safety;Decreased awareness of deficits Awareness: Intellectual Problem Solving: Slow processing General Comments: Pt very fatigued this session due to 2 additional seizures and lack of rest, reporting that she feels very foggy this session          Exercises     Shoulder Instructions       General Comments Pt reports feeling "give out" and very fatigued this session    Pertinent Vitals/ Pain       Pain Assessment: No/denies pain  Home Living  Prior Functioning/Environment              Frequency  Min 2X/week        Progress Toward Goals  OT Goals(current goals can now be found in the care plan section)  Progress towards OT goals: Progressing toward goals  Acute Rehab OT Goals Patient Stated Goal: To go home OT Goal Formulation: With patient/family Time For Goal Achievement: 05/08/21 Potential to Achieve Goals: Good ADL Goals Additional ADL Goal #1: Pt will complete pathfinding activity with minimal cuing. Additional ADL Goal #2: Pt will complete pill box task with no mistakes  Plan Discharge plan remains appropriate;Frequency remains appropriate    Co-evaluation                 AM-PAC OT "6 Clicks" Daily Activity     Outcome Measure   Help from another person eating meals?: None Help from another person taking care of personal  grooming?: None Help from another person toileting, which includes using toliet, bedpan, or urinal?: None Help from another person bathing (including washing, rinsing, drying)?: None Help from another person to put on and taking off regular upper body clothing?: None Help from another person to put on and taking off regular lower body clothing?: None 6 Click Score: 24    End of Session    OT Visit Diagnosis: Other symptoms and signs involving cognitive function   Activity Tolerance Patient limited by fatigue   Patient Left in bed;with call bell/phone within reach;with family/visitor present   Nurse Communication Mobility status        Time: 7517-0017 OT Time Calculation (min): 8 min  Charges: OT General Charges $OT Visit: 1 Visit OT Treatments $Self Care/Home Management : 8-22 mins  Hannah Alper H., OTR/L Acute Rehabilitation  Hannah [name] 04/25/2021, 6:15 PM

## 2021-04-26 ENCOUNTER — Telehealth: Payer: Self-pay | Admitting: Medical Oncology

## 2021-04-26 ENCOUNTER — Inpatient Hospital Stay (HOSPITAL_COMMUNITY): Payer: Medicare HMO

## 2021-04-26 ENCOUNTER — Encounter (HOSPITAL_COMMUNITY): Payer: Medicare HMO

## 2021-04-26 DIAGNOSIS — G936 Cerebral edema: Secondary | ICD-10-CM | POA: Diagnosis not present

## 2021-04-26 DIAGNOSIS — I10 Essential (primary) hypertension: Secondary | ICD-10-CM | POA: Diagnosis not present

## 2021-04-26 NOTE — Progress Notes (Signed)
Occupational Therapy Treatment and Discharge Patient Details Name: Hannah [name] MRN: [id number] DOB: 1951-03-15 Today's Date: 04/26/2021   History of present illness Pt is a 70 y/o female who presents with aphasia and new onset seizures. 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   OT comments  Pt completed all goals this session and family further educated on cognitive deficits. Pt and family were reiterated to ensuring pt is assisted with medication management, financial management, cooking, and any other higher level cognitive task she may encounter. Seizure precautions and ADL planning were further discussed with pt's husband as as he had questions and concerns for once she goes home. Pt has no further OT needs and acute OT will sign off.    Recommendations for follow up therapy are one component of a multi-disciplinary discharge planning process, led by the attending physician.  Recommendations may be updated based on patient status, additional functional criteria and insurance authorization.    Follow Up Recommendations  No OT follow up    Assistance Recommended at Discharge Intermittent Supervision/Assistance  Equipment Recommendations  None recommended by OT    Recommendations for Other Services      Precautions / Restrictions Precautions Precautions: Other (comment) Precaution Comments: seizures Restrictions Weight Bearing Restrictions: No       Mobility Bed Mobility Overal bed mobility: Independent             General bed mobility comments: sup <>sit with no difficulties, line managment needed due to EEG lines    Transfers Overall transfer level: Independent Equipment used: None                General transfer comment: Deferred this session due to EEG.     Balance                                           ADL either performed or assessed with clinical judgement   ADL Overall ADL's : At baseline                                       General ADL Comments: Pt contineus to be at baseline, needing supervision for safety due to cognition and seizure precautinos     Vision   Vision Assessment?: No apparent visual deficits   Perception     Praxis Praxis Praxis: Intact    Cognition Arousal/Alertness: Awake/alert Behavior During Therapy: WFL for tasks assessed/performed Overall Cognitive Status: Impaired/Different from baseline Area of Impairment: Safety/judgement;Awareness;Problem solving                         Safety/Judgement: Decreased awareness of safety;Decreased awareness of deficits Awareness: Intellectual Problem Solving: Slow processing General Comments: Pt completed short blessed this session and weighted score of 9 errors which equates to questionable cognitive impairment.          Exercises     Shoulder Instructions       General Comments      Pertinent Vitals/ Pain       Pain  Assessment: No/denies pain  Home Living                                          Prior Functioning/Environment              Frequency  Min 2X/week        Progress Toward Goals  OT Goals(current goals can now be found in the care plan section)  Progress towards OT goals: Goals met/education completed, patient discharged from OT  Acute Rehab OT Goals Patient Stated Goal: To stop having seizures and go home OT Goal Formulation: All assessment and education complete, DC therapy Time For Goal Achievement: 04/26/21 Potential to Achieve Goals: Good ADL Goals Additional ADL Goal #1: Pt will complete pathfinding activity with minimal cuing. Additional ADL Goal #2: Pt will complete  pill box task with no mistakes  Plan All goals met and education completed, patient discharged from OT services    Co-evaluation                 AM-PAC OT "6 Clicks" Daily Activity     Outcome Measure   Help from another person eating meals?: None Help from another person taking care of personal grooming?: None Help from another person toileting, which includes using toliet, bedpan, or urinal?: None Help from another person bathing (including washing, rinsing, drying)?: None Help from another person to put on and taking off regular upper body clothing?: None Help from another person to put on and taking off regular lower body clothing?: None 6 Click Score: 24    End of Session    OT Visit Diagnosis: Other symptoms and signs involving cognitive function   Activity Tolerance Patient tolerated treatment well   Patient Left in bed;with call bell/phone within reach;with family/visitor present   Nurse Communication Mobility status        Time: 0929-5747 OT Time Calculation (min): 12 min  Charges: OT General Charges $OT Visit: 1 Visit OT Treatments $Therapeutic Activity: 8-22 mins  Hannah [name] H., OTR/L Acute Rehabilitation  Hannah [name] Hannah [name] 04/26/2021, 7:22 PM

## 2021-04-26 NOTE — Progress Notes (Signed)
LTM maint complete - no skin breakdown  maintenance A2 T8P4 Cz O1 P3 C3 F3  F7

## 2021-04-26 NOTE — Progress Notes (Signed)
Around 6 am this morning,  patient told RN that she could feel a seizure about to happen and she tried to stay calm and relax to keep it from happening.   Will continue to monitor

## 2021-04-26 NOTE — Telephone Encounter (Signed)
Daughter asking about plan of care. I instructed her to keep appt next week with [facility]  for further plan. She voiced understanding.

## 2021-04-26 NOTE — Progress Notes (Addendum)
PROGRESS NOTE    [name]  ZLD:[id number] DOB: 02-Jun-1951 DOA: 04/22/2021 PCP: [name], MD   Chief Complaint  Patient presents with   Weakness    Brief Narrative:   Hannah [name] is a 70 y.o. 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  She was noted to have seizures, which is new onset, as well she had an episode noted in ED as well, he was given Keppra 1 g, as well started on Decadron, and admitted to [facility] for further management. -MRI brain noted increase in size of lesions with surrounding edema, likely related to post XRT necrosis  Assessment & Plan:   Seizures/altered mental status/vasogenic brain edema Brain metastasis -Prior history of XRT, SRS in 07/2020 and 11/2020  -MRI of the brain showing increasing size of the lesions(possibly related post stereotactic necrosis) with surrounding edema. -Continue with Keppra, and Decadron, Dr. Waldron Labs discussed with Dr. Mickeal Skinner, recommended discharge on Keppra and Decadron, he will arrange outpatient follow-up -Neurology increased Keppra dose 10/16 night -2 transient seizure-like episodes yesterday morning, felt an aura this morning without any seizure, neurology has ordered continuous EEG monitoring today   Fever/Leukopenia -had one initally fever reading of 100.4 at outside ED. in the setting of worsening leukopenia she was treated with empiric vancomycin and cefepime. -Clinically do not suspect meningitis  -Blood cultures are negative X2 days, urine culture <10K colonies, flu and  COVID PCR negative  -Afebrile since, antibiotics discontinued  Stage IV non-small cell right upper lobe lung cancer with brain metastasis  -diagnosed in February 2022 - s/p SRS, craniotomy and resection on solitary lesion in 07/2020 repeat SRS of two new brain mets 11/29/20  robot assisted right upper lobectomy on active chemotherapy -Followed by Dr. Earlie Server - Last chemotherapy session on 04/11/2021   HTN  -mildly elevated. Not on any hypertensives -add PRN IV labetalol for systolic greater than 903 and diastolic greater than 009   Aortic sclerosis with insufficiency -Mild to moderate seen on echo in March 2022.   Hyperlipidemia Continue statin    Type 2 diabetes Controlled. Last hemoglobin A1c of 6 on 03/20/2021   DVT prophylaxis: SCD's Code Status: Full Family Communication: No family at bedside, discussed with daughter 10/26 Disposition: Home in 1 to 2 days  Status is: inpatient Inpatient appropriate due to severity of illness      Consultants:  Neurology D/w Neuro-oncology, recommendation for Keppra and Decadron, and Dr. Mickeal Skinner will arrange outpatient follow-up next week.  Subjective: -Reported having an aura this morning, no seizure-like episodes noted, last episodes before that were from yesterday am  Objective: Vitals:   04/25/21 2002 04/25/21 2342 04/26/21 0425 04/26/21 0813  BP: 119/63 136/73 125/66 (!) 146/78  Pulse: (!) 53 60 (!) 52 (!) 49  Resp: (!) 22 20 20 18   Temp: 98.1 F (36.7 C) 97.8 F (36.6 C) 98.6 F (37 C) 98 F (36.7 C)  TempSrc: Oral Oral Oral Oral  SpO2: 100% 99% 100% 100%  Weight:      Height:        Intake/Output Summary (Last 24 hours) at 04/26/2021  [place] filed at 04/25/2021 1618 Gross per 24 hour  Intake 240 ml  Output 700 ml  Net -460 ml   Filed Weights   04/22/21 1644  Weight: 51.3 kg    Examination:  Gen: Gen: Awake, Alert, Oriented X 3, pleasant, no distress HEENT: no JVD Lungs: Good air movement  bilaterally, CTAB CVS: S1S2/RRR Abd: soft, Non tender, non distended, BS present Extremities: No edema Skin: no new rashes on exposed skin  Neuro: Moves all extremities, no localizing signs  Data Reviewed: I have personally reviewed following labs and imaging studies  CBC: Recent Labs  Lab 04/22/21 1700 04/23/21 0410 04/24/21 0352 04/25/21 0325  WBC 3.1* 1.8* 3.0* 5.9  HGB 9.2* 9.6* 8.8* 9.1*  HCT 29.7* 30.4* 28.7* 29.7*  MCV 88.7 87.9 89.4 90.0  PLT 206 221 213 932    Basic Metabolic Panel: Recent Labs  Lab 04/22/21 1700 04/23/21 0410 04/24/21 0352 04/25/21 0325  NA 136 136 140 139  K 4.2 4.1 4.2 4.4  CL 100 102 108 107  CO2 28 26 24 26   GLUCOSE 108* 160* 140* 148*  BUN 11 7* 10 12  CREATININE 0.63 0.66 0.66 0.69  CALCIUM 9.7 9.8 9.8 10.0    GFR: Estimated Creatinine Clearance: 45.7 mL/min (by C-G formula based on SCr of 0.69 mg/dL).  Liver Function Tests: Recent Labs  Lab 04/24/21 0352  AST 22  ALT 14  ALKPHOS 61  BILITOT 0.4  PROT 6.4*  ALBUMIN 2.8*    CBG: Recent Labs  Lab 04/22/21 1657 04/22/21 1659 04/22/21 1905  GLUCAP 56* 107* 105*     Recent Results (from the past 240 hour(s))  Resp Panel by RT-PCR (Flu A&B, Covid) Nasopharyngeal Swab     Status: None   Collection Time: 04/22/21  8:09 PM   Specimen: Nasopharyngeal Swab; Nasopharyngeal(NP) swabs in vial transport medium  Result Value Ref Range Status   SARS Coronavirus 2 by RT PCR NEGATIVE NEGATIVE Final    Comment: (NOTE) SARS-CoV-2 target nucleic acids are NOT DETECTED.  The SARS-CoV-2 RNA is generally detectable in upper respiratory specimens during the acute phase of infection. The lowest concentration of SARS-CoV-2 viral copies this assay can detect is 138 copies/mL. A negative result does not preclude SARS-Cov-2 infection and should not be used as the sole basis for treatment or other patient management decisions. A negative result may occur with  improper specimen  collection/handling, submission of specimen other than nasopharyngeal swab, presence of viral mutation(s) within the areas targeted by this assay, and inadequate number of viral copies(<138 copies/mL). A negative result must be combined with clinical observations, patient history, and epidemiological information. The expected result is Negative.  Fact Sheet for Patients:  EntrepreneurPulse.com.au  Fact Sheet for Healthcare Providers:  IncredibleEmployment.be  This test is no t yet approved or cleared by the [place] FDA and  has been authorized for detection and/or diagnosis of SARS-CoV-2 by FDA under an Emergency Use Authorization (EUA). This EUA will remain  in effect (meaning this test can be used) for the duration of the COVID-19 declaration under Section 564(b)(1) of the Act, 21 U.S.C.section 360bbb-3(b)(1), unless the authorization is terminated  or revoked sooner.       Influenza A by PCR NEGATIVE NEGATIVE Final   Influenza B by PCR NEGATIVE NEGATIVE Final    Comment: (NOTE) The Xpert Xpress SARS-CoV-2/FLU/RSV plus assay is intended as an aid in the diagnosis of influenza from Nasopharyngeal swab specimens and should not be used as  a sole basis for treatment. Nasal washings and aspirates are unacceptable for Xpert Xpress SARS-CoV-2/FLU/RSV testing.  Fact Sheet for Patients: EntrepreneurPulse.com.au  Fact Sheet for Healthcare Providers: IncredibleEmployment.be  This test is not yet approved or cleared by the [place] FDA and has been authorized for detection and/or diagnosis of SARS-CoV-2 by FDA under an Emergency Use Authorization (EUA). This EUA will remain in effect (meaning this test can be used) for the duration of the COVID-19 declaration under Section 564(b)(1) of the Act, 21 U.S.C. section 360bbb-3(b)(1), unless the authorization is terminated or revoked.  Performed at [facility], [address] [place], [place] [address]   Culture, blood (routine x 2)     Status: None (Preliminary result)   Collection Time: 04/23/21  2:10 AM   Specimen: BLOOD RIGHT FOREARM  Result Value Ref Range Status   Specimen Description BLOOD RIGHT FOREARM  Final   Special Requests   Final    BOTTLES DRAWN AEROBIC AND ANAEROBIC BACTEROIDES CACCAE   Culture   Final    NO GROWTH 2 DAYS Performed at [facility], [place] [address], [place], [place] [address]    Report Status PENDING  Incomplete  Culture, blood (routine x 2)     Status: None (Preliminary result)   Collection Time: 04/23/21  2:19 AM   Specimen: BLOOD  Result Value Ref Range Status   Specimen Description BLOOD LEFT ANTECUBITAL  Final   Special Requests   Final    BOTTLES DRAWN AEROBIC AND ANAEROBIC Blood Culture adequate volume   Culture   Final    NO GROWTH 2 DAYS Performed at [facility], [place] [address], [place], [place] [address]    Report Status PENDING  Incomplete  Urine Culture     Status: Abnormal   Collection Time: 04/23/21  2:38 AM   Specimen: Urine, Clean Catch  Result Value Ref Range Status   Specimen Description URINE, CLEAN CATCH  Final   Special Requests NONE  Final   Culture (A)  Final    <10,000 COLONIES/mL INSIGNIFICANT GROWTH Performed at [facility], [place] [address] [place], [place] [address]    Report Status 04/23/2021 FINAL  Final    Radiology Studies: No results found.  Scheduled Meds:  vitamin C  1,000 mg Oral Daily   citalopram  10 mg Oral Daily   dexamethasone  6 mg Oral Q12H   feeding supplement  237 mL Oral BID BM   folic acid  1 mg Oral Daily   levETIRAcetam  1,500 mg Oral BID   mirtazapine  45 mg Oral QHS   multivitamin with minerals  1 tablet Oral Daily   pantoprazole  40 mg Oral Daily   polyethylene glycol  17 g Oral BID   polyvinyl alcohol  1-2 drop Both Eyes Daily   pravastatin  40 mg Oral QPM   Continuous Infusions:      LOS: 3 days   Domenic Polite, MD [facility] .  04/26/2021, 11:37 AM

## 2021-04-26 NOTE — Progress Notes (Signed)
Started cEEG study.  Notified [facility] monitoring.  Tested patient event button. 

## 2021-04-26 NOTE — Care Management Important Message (Signed)
Important Message  Patient Details  Name: Hannah [name] MRN: [id number] Date of Birth: [birth year]   Medicare Important Message Given:  Yes     Debby Clyne Montine Circle 04/26/2021, 3:49 PM

## 2021-04-26 NOTE — Progress Notes (Signed)
NEUROLOGY CONSULTATION PROGRESS NOTE   Date of service: April 26, 2021 Patient Name: GRISEL BLUMENSTOCK MRN:  [id number] DOB:  1951-04-06  Brief HPI  Verle JANNEL LYNNE is a 70 year old female with new onset seizures in the setting of worsening edema on MRI brain related to metastatic lesions.  On Keppra 1500mg  BID and Decadron 6mg  Q6H.   Interval Hx   Yesterday AM, had a 2 sec episode of dazed off. Today, was in the bathroom, felt anxiety, then could not talk to the female tech. This was only few to 10 secs only but also felt anxious due to feeling like she was being rushed. Do not seem to be consistent with the seizures that she presented with. Will put her up on cEEG to characterize if these events are seizures.  Vitals   Vitals:   04/25/21 2342 04/26/21 0425 04/26/21 0813 04/26/21 1236  BP: 136/73 125/66 (!) 146/78 (!) 142/80  Pulse: 60 (!) 52 (!) 49 (!) 58  Resp: 20 20 18 19   Temp: 97.8 F (36.6 C) 98.6 F (37 C) 98 F (36.7 C) 98.2 F (36.8 C)  TempSrc: Oral Oral Oral Oral  SpO2: 99% 100% 100% 100%  Weight:      Height:         Body mass index is 22.07 kg/m.  Physical Exam   General: Laying comfortably in bed; in no acute distress.  HENT: Normal oropharynx and mucosa. Normal external appearance of ears and nose.  Neck: Supple, no pain or tenderness  CV: No JVD. No peripheral edema.  Pulmonary: Symmetric Chest rise. Normal respiratory effort.  Abdomen: Soft to touch, non-tender.  Ext: No cyanosis, edema, or deformity  Skin: No rash. Normal palpation of skin.   Musculoskeletal: Normal digits and nails by inspection. No clubbing.   Neurologic Examination  Mental status/Cognition: Alert, oriented to self, place, month and year, good attention.  Speech/language: Fluent, comprehension intact, object naming intact, repetition intact.  Cranial nerves:   CN II Pupils equal and reactive to light, no VF deficits    CN III,IV,VI EOM intact, no gaze preference or deviation, no  nystagmus    CN V normal sensation in V1, V2, and V3 segments bilaterally    CN VII no asymmetry, no nasolabial fold flattening    CN VIII normal hearing to speech    CN IX & X normal palatal elevation, no uvular deviation    CN XI 5/5 head turn and 5/5 shoulder shrug bilaterally    CN XII midline tongue protrusion    Motor:  Muscle bulk: normal, tone normal, pronator drift none tremor none Mvmt Root Nerve  Muscle Right Left Comments  SA C5/6 Ax Deltoid 5 5   EF C5/6 Mc Biceps 5 5   EE C6/7/8 Rad Triceps 4+ 5   WF C6/7 Med FCR     WE C7/8 PIN ECU     F Ab C8/T1 U ADM/FDI 4+ 5   HF L1/2/3 Fem Illopsoas 5 5   KE L2/3/4 Fem Quad 5 5   DF L4/5 D Peron Tib Ant 5 5   PF S1/2 Tibial Grc/Sol 5 5    Reflexes:  Right Left Comments  Pectoralis      Biceps (C5/6) 2 2   Brachioradialis (C5/6) 2 2    Triceps (C6/7) 2 2    Patellar (L3/4) 2 2    Achilles (S1)      Hoffman      Plantar     Jaw  jerk    Sensation:  Light touch Intact throughout   Pin prick    Temperature    Vibration   Proprioception    Coordination/Complex Motor:  - Finger to Nose intact BL - Heel to shin intact BL - Rapid alternating movement are normal - Gait: Stride length short. Arm swing poor. Base width narrow  Labs   Basic Metabolic Panel:  Lab Results  Component Value Date   NA 139 04/25/2021   K 4.4 04/25/2021   CO2 26 04/25/2021   GLUCOSE 148 (H) 04/25/2021   BUN 12 04/25/2021   CREATININE 0.69 04/25/2021   CALCIUM 10.0 04/25/2021   GFRNONAA >60 04/25/2021   GFRAA 87 03/14/2020   HbA1c:  Lab Results  Component Value Date   HGBA1C 6.0 03/20/2021   LDL:  Lab Results  Component Value Date   LDLCALC 62 03/14/2020   Urine Drug Screen: No results found for: LABOPIA, COCAINSCRNUR, LABBENZ, AMPHETMU, THCU, LABBARB  Alcohol Level No results found for: ETH No results found for: PHENYTOIN, ZONISAMIDE, LAMOTRIGINE, LEVETIRACETA No results found for: PHENYTOIN, PHENOBARB, VALPROATE,  CBMZ  Imaging and Diagnostic studies   MRI Brain w + w/o Contrast: Increased enhancement and vasogenic edema at the treated left frontal and left parietal metastases.    Impression   DEANNIE RESETAR is a 70 y.o. female with with new onset seizures in the setting of worsening edema on MRI brain related to metastatic lesions. Started on Keppra and decadron. Increased after a seizure day before yesterday.  Had a 2 sec episode of dazed off. Today, was in the bathroom, felt anxiety, then could not talk to the female tech but reports that she felt being rushed and anxious when this was happening.  Recommendations  - Keppra 1500mg  BID. - Started on cEEG this AM. Will keep for 24 hours to see if we can characterize any events. - continue decadron 6mg  Q12H. - No driving for 6 months and reiterated today - Follow up with Dr. Mickeal Skinner. _____________________________________________________________________  Plan discussed with patient and daughter at bedside and with dr. Broadus John over secure chat.   Thank you for the opportunity to take part in the care of this patient. If you have any further questions, please contact the neurology consultation attending.  Signed,  [place] Pager Number [phone]

## 2021-04-27 DIAGNOSIS — I10 Essential (primary) hypertension: Secondary | ICD-10-CM | POA: Diagnosis not present

## 2021-04-27 DIAGNOSIS — C7931 Secondary malignant neoplasm of brain: Secondary | ICD-10-CM | POA: Diagnosis not present

## 2021-04-27 DIAGNOSIS — G936 Cerebral edema: Secondary | ICD-10-CM | POA: Diagnosis not present

## 2021-04-27 MED ORDER — LACOSAMIDE 50 MG PO TABS
100.0000 mg | ORAL_TABLET | Freq: Two times a day (BID) | ORAL | Status: DC
  Administered 2021-04-27 – 2021-04-28 (×2): 100 mg via ORAL
  Filled 2021-04-27 (×2): qty 2

## 2021-04-27 MED ORDER — SODIUM CHLORIDE 0.9 % IV SOLN
200.0000 mg | Freq: Once | INTRAVENOUS | Status: AC
  Administered 2021-04-27: 200 mg via INTRAVENOUS
  Filled 2021-04-27: qty 20

## 2021-04-27 NOTE — Procedures (Addendum)
Patient Name: Hannah [name]  MRN: [id number]  Epilepsy Attending: Lora Havens  Referring Physician/Provider: Dr Donnetta Simpers Duration: 04/26/2021 1856 to 04/27/2021 0934   Patient history: 70 year old female with new onset seizures in the setting of worsening edema related to metastatic lesions.  EEG to evaluate for seizure.   Level of alertness: Awake, asleep   AEDs during EEG study: LEV   Technical aspects: This EEG study was done with scalp electrodes positioned according to the 10-20 International system of electrode placement. Electrical activity was acquired at a sampling rate of 500Hz  and reviewed with a high frequency filter of 70Hz  and a low frequency filter of 1Hz . EEG data were recorded continuously and digitally stored.    Description: The posterior dominant rhythm consists of 9 Hz activity of moderate voltage (25-35 uV) seen predominantly in posterior head regions, symmetric and reactive to eye opening and eye closing. Sleep was characterized by vertex waves, sleep spindles (12-14Hz ), maximal frontocentral region. EEG showed intermittent 3-5hz  theta-delta slowing admixed with sharp waves in right fronto-central region consistent with breach artifact.   One seizure without clinical signs was noted on 04/26/2021 at 1445. During the seizure, eeg showed 13hz  beta activity in left fronto-central region which evolved into 7-8hz hz alpha activity admixed with sharp waves and eventually 2-3hz  delta slowing. Duration of seizure was about 2 minutes.    ABNORMALITY - Focal seizure without clinical signs, left fronto-central region - Breach artifact, left fronto-central region - Spike, left fronto-central region  IMPRESSION: This study showed one seizure without clinical signs arising from left fronto-central region on 04/26/2021 at 144, lasting about 2 minutes. There is also evidence of epileptogenicity and cortical dysfunction left frontocentral region consistent with history if  craniotomy.           Sheralee Qazi Barbra Sarks

## 2021-04-27 NOTE — Progress Notes (Signed)
NEUROLOGY CONSULTATION PROGRESS NOTE   Date of service: April 27, 2021 Patient Name: Hannah [name] MRN:  [id number] DOB:  [birth year]  Brief HPI  Hannah [name] is a 70 year old female with new onset seizures in the setting of worsening edema on MRI brain related to metastatic lesions.  On Keppra 1500mg  BID and Decadron 6mg  Q6H.  Interval Hx   Yesterday afternoon around 1445, she had a focal seizure without any clinical signs.  Originated from left frontal temporal central region.  Vitals   Vitals:   04/27/21 0029 04/27/21 0332 04/27/21 0800 04/27/21 1200  BP: 127/60 (!) 116/53 126/72 128/68  Pulse: (!) 43 (!) 44 62 (!) 59  Resp:  15 16 16   Temp: 98 F (36.7 C) 98.1 F (36.7 C) 98.2 F (36.8 C) 98.8 F (37.1 C)  TempSrc: Oral Oral Oral Oral  SpO2: 99%  99% 99%  Weight:      Height:         Body mass index is 22.07 kg/m.  Physical Exam   General: Laying comfortably in bed; in no acute distress.  HENT: Normal oropharynx and mucosa. Normal external appearance of ears and nose.  Neck: Supple, no pain or tenderness  CV: No JVD. No peripheral edema.  Pulmonary: Symmetric Chest rise. Normal respiratory effort.  Abdomen: Soft to touch, non-tender.  Ext: No cyanosis, edema, or deformity  Skin: No rash. Normal palpation of skin.   Musculoskeletal: Normal digits and nails by inspection. No clubbing.   Neurologic Examination  Mental status/Cognition: Alert, oriented to self, place, month and year, good attention.  Speech/language: Fluent, comprehension intact, object naming intact, repetition intact.  Cranial nerves:   CN II Pupils equal and reactive to light, no VF deficits    CN III,IV,VI EOM intact, no gaze preference or deviation, no nystagmus    CN V normal sensation in V1, V2, and V3 segments bilaterally    CN VII no asymmetry, no nasolabial fold flattening    CN VIII normal hearing to speech    CN IX & X normal palatal elevation, no uvular deviation    CN XI 5/5  head turn and 5/5 shoulder shrug bilaterally    CN XII midline tongue protrusion    Motor:  Muscle bulk: normal, tone normal, pronator drift none tremor none Mvmt Root Nerve  Muscle Right Left Comments  SA C5/6 Ax Deltoid 5 5   EF C5/6 Mc Biceps 5 5   EE C6/7/8 Rad Triceps 4+ 5   WF C6/7 Med FCR     WE C7/8 PIN ECU     F Ab C8/T1 U ADM/FDI 4+ 5   HF L1/2/3 Fem Illopsoas 5 5   KE L2/3/4 Fem Quad 5 5   DF L4/5 D Peron Tib Ant 5 5   PF S1/2 Tibial Grc/Sol 5 5    Reflexes:  Right Left Comments  Pectoralis      Biceps (C5/6) 2 2   Brachioradialis (C5/6) 2 2    Triceps (C6/7) 2 2    Patellar (L3/4) 2 2    Achilles (S1)      Hoffman      Plantar     Jaw jerk    Sensation:  Light touch Intact throughout   Pin prick    Temperature    Vibration   Proprioception    Coordination/Complex Motor:  - Finger to Nose intact BL - Heel to shin intact BL - Rapid alternating movement are normal - Gait: Stride  length short. Arm swing poor. Base width narrow  Labs   Basic Metabolic Panel:  Lab Results  Component Value Date   NA 139 04/25/2021   K 4.4 04/25/2021   CO2 26 04/25/2021   GLUCOSE 148 (H) 04/25/2021   BUN 12 04/25/2021   CREATININE 0.69 04/25/2021   CALCIUM 10.0 04/25/2021   GFRNONAA >60 04/25/2021   GFRAA 87 03/14/2020   HbA1c:  Lab Results  Component Value Date   HGBA1C 6.0 03/20/2021   LDL:  Lab Results  Component Value Date   LDLCALC 62 03/14/2020   Urine Drug Screen: No results found for: LABOPIA, COCAINSCRNUR, LABBENZ, AMPHETMU, THCU, LABBARB  Alcohol Level No results found for: ETH No results found for: PHENYTOIN, ZONISAMIDE, LAMOTRIGINE, LEVETIRACETA No results found for: PHENYTOIN, PHENOBARB, VALPROATE, CBMZ  Imaging and Diagnostic studies   MRI Brain w + w/o Contrast: Increased enhancement and vasogenic edema at the treated left frontal and left parietal metastases.    Impression   Hannah [name] is a 70 y.o. female with with new onset  seizures in the setting of worsening edema on MRI brain related to metastatic lesions.   She had a 2-minute left frontal central region focal seizure without any clinical signs on 04/26/2021 around 1445.  Recommendations  - Keppra 1500mg  BID. - Vimpat 200mg  IV load followed by Vimpat 100mg  BID PO - cEEG - continue decadron 6mg  Q12H. - No driving for 6 months. - Follow up with Dr. Mickeal Skinner. _____________________________________________________________________  Plan discussed with patient and son at bedside and with dr. Broadus John over secure chat.   Thank you for the opportunity to take part in the care of this patient. If you have any further questions, please contact the neurology consultation attending.  Signed,  [place] Pager Number [phone]

## 2021-04-27 NOTE — Progress Notes (Signed)
PROGRESS NOTE    DARCIE MELLONE  DVV:[id number] DOB: 1950-10-21 DOA: 04/22/2021 PCP: [name], MD   Chief Complaint  Patient presents with   Weakness    Brief Narrative:   SHEYLI HORWITZ is a 70 y.o. 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  She was noted to have seizures, which is new onset, as well she had an episode noted in ED as well, he was given Keppra 1 g, as well started on Decadron, and admitted to [facility] for further management. -MRI brain noted increase in size of lesions with surrounding edema, likely related to post XRT necrosis  Assessment & Plan:   Seizures/altered mental status/vasogenic brain edema Brain metastasis -Prior history of XRT, SRS in 07/2020 and 11/2020  -MRI of the brain showing increasing size of the lesions(possibly related post stereotactic necrosis) with surrounding edema. -Continue with Keppra, and Decadron, Dr. Waldron Labs discussed with Dr. Mickeal Skinner, recommended discharge on Keppra and Decadron, he will arrange outpatient follow-up -Neurology increased Keppra dose 10/16 night -on continuous EEG by neurology, patient denies any episodes yesterday, await read -DC later today if EEG is unremarkable   Fever/Leukopenia -had one initally fever reading of 100.4 at outside ED. in the setting of worsening leukopenia she was treated with empiric vancomycin and cefepime. -Clinically do not suspect meningitis  -Blood cultures are negative X2 days, urine culture <10K colonies, flu and COVID PCR negative  -Afebrile  since, antibiotics discontinued  Stage IV non-small cell right upper lobe lung cancer with brain metastasis  -diagnosed in February 2022 - s/p SRS, craniotomy and resection on solitary lesion in 07/2020 repeat SRS of two new brain mets 11/29/20  robot assisted right upper lobectomy on active chemotherapy -Followed by Dr. Earlie Server - Last chemotherapy session on 04/11/2021   HTN  -mildly elevated. Not on any hypertensives -Stable now, continue PRNs   Aortic sclerosis with insufficiency -Mild to moderate seen on echo in March 2022.   Hyperlipidemia Continue statin    Type 2 diabetes Controlled. Last hemoglobin A1c of 6 on 03/20/2021   DVT prophylaxis: SCD's Code Status: Full Family Communication: No family at bedside, discussed with daughter earlier Disposition: Home when cleared by Neurology  Status is: inpatient Inpatient appropriate due to severity of illness    Consultants:  Neurology D/w Neuro-oncology, recommendation for Keppra and Decadron, and Dr. Mickeal Skinner will arrange outpatient follow-up next week.  Subjective: -Feels okay today, denies any seizure-like episodes, anxious to go home  Objective: Vitals:   04/26/21 2010 04/27/21 0029 04/27/21 0332 04/27/21 0800  BP: 130/72 127/60 (!) 116/53 126/72  Pulse: 71 (!) 43 (!) 44 62  Resp: 16  15 16   Temp: 97.8 F (36.6 C) 98 F (36.7 C) 98.1 F (36.7 C) 98.2 F (36.8 C)  TempSrc: Oral Oral Oral Oral  SpO2: 100% 99%  99%  Weight:      Height:        Intake/Output Summary (Last 24 hours) at 04/27/2021 1145 Last data filed at 04/27/2021 0458 Gross per 24 hour  Intake 240 ml  Output 550 ml  Net -310 ml  Filed Weights   04/22/21 1644  Weight: 51.3 kg    Examination:  Gen: Gen: Awake, Alert, Oriented X 3,  HEENT: no JVD, EEG leads on Lungs: Good air movement bilaterally, CTAB CVS: S1S2/RRR Abd: soft, Non tender, non distended, BS present Extremities: No edema Skin: no new rashes on exposed skin  Neuro:  Moves all extremities, no localizing signs  Data Reviewed: I have personally reviewed following labs and imaging studies  CBC: Recent Labs  Lab 04/22/21 1700 04/23/21 0410 04/24/21 0352 04/25/21 0325  WBC 3.1* 1.8* 3.0* 5.9  HGB 9.2* 9.6* 8.8* 9.1*  HCT 29.7* 30.4* 28.7* 29.7*  MCV 88.7 87.9 89.4 90.0  PLT 206 221 213 440    Basic Metabolic Panel: Recent Labs  Lab 04/22/21 1700 04/23/21 0410 04/24/21 0352 04/25/21 0325  NA 136 136 140 139  K 4.2 4.1 4.2 4.4  CL 100 102 108 107  CO2 28 26 24 26   GLUCOSE 108* 160* 140* 148*  BUN 11 7* 10 12  CREATININE 0.63 0.66 0.66 0.69  CALCIUM 9.7 9.8 9.8 10.0    GFR: Estimated Creatinine Clearance: 45.7 mL/min (by C-G formula based on SCr of 0.69 mg/dL).  Liver Function Tests: Recent Labs  Lab 04/24/21 0352  AST 22  ALT 14  ALKPHOS 61  BILITOT 0.4  PROT 6.4*  ALBUMIN 2.8*    CBG: Recent Labs  Lab 04/22/21 1657 04/22/21 1659 04/22/21 1905  GLUCAP 56* 107* 105*     Recent Results (from the past 240 hour(s))  Resp Panel by RT-PCR (Flu A&B, Covid) Nasopharyngeal Swab     Status: None   Collection Time: 04/22/21  8:09 PM   Specimen: Nasopharyngeal Swab; Nasopharyngeal(NP) swabs in vial transport medium  Result Value Ref Range Status   SARS Coronavirus 2 by RT PCR NEGATIVE NEGATIVE Final    Comment: (NOTE) SARS-CoV-2 target nucleic acids are NOT DETECTED.  The SARS-CoV-2 RNA is generally detectable in upper respiratory specimens during the acute phase of infection. The lowest concentration of SARS-CoV-2 viral copies this assay can detect is 138 copies/mL. A negative result does not preclude SARS-Cov-2 infection and should not be used as the sole basis for treatment or other patient management decisions. A negative result may occur with  improper specimen collection/handling, submission of specimen other than nasopharyngeal swab, presence of viral mutation(s) within the areas targeted by this assay, and  inadequate number of viral copies(<138 copies/mL). A negative result must be combined with clinical observations, patient history, and epidemiological information. The expected result is Negative.  Fact Sheet for Patients:  EntrepreneurPulse.com.au  Fact Sheet for Healthcare Providers:  IncredibleEmployment.be  This test is no t yet approved or cleared by the [place] FDA and  has been authorized for detection and/or diagnosis of SARS-CoV-2 by FDA under an Emergency Use Authorization (EUA). This EUA will remain  in effect (meaning this test can be used) for the duration of the COVID-19 declaration under Section 564(b)(1) of the Act, 21 U.S.C.section 360bbb-3(b)(1), unless the authorization is terminated  or revoked sooner.       Influenza A by PCR NEGATIVE NEGATIVE Final   Influenza B by PCR NEGATIVE NEGATIVE Final    Comment: (NOTE) The Xpert Xpress SARS-CoV-2/FLU/RSV plus assay is intended as an aid in the diagnosis of influenza from Nasopharyngeal swab specimens and should not be used as a sole basis for treatment. Nasal washings and aspirates are unacceptable for Xpert Xpress SARS-CoV-2/FLU/RSV testing.  Fact Sheet for Patients: EntrepreneurPulse.com.au  Fact  Sheet for Healthcare Providers: IncredibleEmployment.be  This test is not yet approved or cleared by the [place] and has been authorized for detection and/or diagnosis of SARS-CoV-2 by FDA under an Emergency Use Authorization (EUA). This EUA will remain in effect (meaning this test can be used) for the duration of the COVID-19 declaration under Section 564(b)(1) of the Act, 21 U.S.C. section 360bbb-3(b)(1), unless the authorization is terminated or revoked.  Performed at [facility], [address], [place], [place] [address]   Culture, blood (routine x 2)     Status: None (Preliminary result)   Collection  Time: 04/23/21  2:10 AM   Specimen: BLOOD RIGHT FOREARM  Result Value Ref Range Status   Specimen Description BLOOD RIGHT FOREARM  Final   Special Requests   Final    BOTTLES DRAWN AEROBIC AND ANAEROBIC BACTEROIDES CACCAE   Culture   Final    NO GROWTH 4 DAYS Performed at [facility], [place] [address], [place], [place] [address]    Report Status PENDING  Incomplete  Culture, blood (routine x 2)     Status: None (Preliminary result)   Collection Time: 04/23/21  2:19 AM   Specimen: BLOOD  Result Value Ref Range Status   Specimen Description BLOOD LEFT ANTECUBITAL  Final   Special Requests   Final    BOTTLES DRAWN AEROBIC AND ANAEROBIC Blood Culture adequate volume   Culture   Final    NO GROWTH 4 DAYS Performed at [facility], [place] [address], [place], [place] [address]    Report Status PENDING  Incomplete  Urine Culture     Status: Abnormal   Collection Time: 04/23/21  2:38 AM   Specimen: Urine, Clean Catch  Result Value Ref Range Status   Specimen Description URINE, CLEAN CATCH  Final   Special Requests NONE  Final   Culture (A)  Final    <10,000 COLONIES/mL INSIGNIFICANT GROWTH Performed at [facility], [place] [address] [place],  [address]    Report Status 04/23/2021 FINAL  Final    Radiology Studies: Overnight EEG with video  Result Date: 04/27/2021 Lora Havens, MD     04/27/2021  9:11 AM Patient Name: PARA COSSEY MRN: [id number] Epilepsy Attending: Lora Havens Referring Physician/Provider: Dr Donnetta Simpers Duration: 04/26/2021 0934 to 04/27/2021 0908  Patient history: 70 year old female with new onset seizures in the setting of worsening edema related to metastatic lesions.  EEG to evaluate for seizure.  Level of alertness: Awake, asleep  AEDs during EEG study: LEV  Technical aspects: This EEG study was done with scalp electrodes positioned according to the 10-20 International system of electrode placement. Electrical activity was  acquired at a sampling rate of 500Hz  and reviewed with a high frequency filter of 70Hz  and a low frequency filter of 1Hz . EEG data were recorded continuously and digitally stored.  Description: The posterior dominant rhythm consists of 9 Hz activity of moderate voltage (25-35 uV) seen predominantly in posterior head regions, symmetric and reactive to eye opening and eye closing. Sleep was characterized by vertex waves, sleep spindles (12-14Hz ), maximal frontocentral region. EEG showed intermittent 3-5hz  theta-delta slowing admixed with sharp waves in right fronto-central region consistent with breach artifact. One seizure without clinical signs was noted on 04/26/2021 at 1445. During the seizure, eeg showed 13hz  beta activity in left fronto-central region which evolved into 7-8hz hz alpha activity admixed with sharp waves and eventually 2-3hz  delta slowing. Duration of seizure was  about 2 minutes.  ABNORMALITY - Focal seizure without clinical signs, left fronto-central region - Breach artifact, left fronto-central region - Spike, left fronto-central region IMPRESSION: This study showed one seizure without clinical signs arising from left fronto-central region on 04/26/2021 at 144, lasting about 2 minutes. There is also evidence of epileptogenicity and cortical dysfunction left frontocentral region consistent with history if craniotomy.   Priyanka Barbra Sarks    Scheduled Meds:  citalopram  10 mg Oral Daily   dexamethasone  6 mg Oral Q12H   feeding supplement  237 mL Oral BID BM   folic acid  1 mg Oral Daily   lacosamide  100 mg Oral BID   levETIRAcetam  1,500 mg Oral BID   mirtazapine  45 mg Oral QHS   pantoprazole  40 mg Oral Daily   polyethylene glycol  17 g Oral BID   polyvinyl alcohol  1-2 drop Both Eyes Daily   pravastatin  40 mg Oral QPM   Continuous Infusions:     LOS: 4 days   Domenic Polite, MD [facility] .  04/27/2021, 11:45 AM

## 2021-04-27 NOTE — Progress Notes (Signed)
LTM  checked. O1 and T8 reapplied. Impedance good. No skin breakdown noted. Results pending.

## 2021-04-28 ENCOUNTER — Inpatient Hospital Stay (HOSPITAL_COMMUNITY): Payer: Medicare HMO

## 2021-04-28 DIAGNOSIS — I10 Essential (primary) hypertension: Secondary | ICD-10-CM | POA: Diagnosis not present

## 2021-04-28 DIAGNOSIS — C7931 Secondary malignant neoplasm of brain: Secondary | ICD-10-CM | POA: Diagnosis not present

## 2021-04-28 LAB — CBC
HCT: 32.8 % — ABNORMAL LOW (ref 36.0–46.0)
Hemoglobin: 9.9 g/dL — ABNORMAL LOW (ref 12.0–15.0)
MCH: 27.4 pg (ref 26.0–34.0)
MCHC: 30.2 g/dL (ref 30.0–36.0)
MCV: 90.9 fL (ref 80.0–100.0)
Platelets: 339 10*3/uL (ref 150–400)
RBC: 3.61 MIL/uL — ABNORMAL LOW (ref 3.87–5.11)
RDW: 14.3 % (ref 11.5–15.5)
WBC: 7.7 10*3/uL (ref 4.0–10.5)
nRBC: 0 % (ref 0.0–0.2)

## 2021-04-28 LAB — CULTURE, BLOOD (ROUTINE X 2)
Culture: NO GROWTH
Culture: NO GROWTH
Special Requests: ADEQUATE

## 2021-04-28 LAB — BASIC METABOLIC PANEL
Anion gap: 7 (ref 5–15)
BUN: 15 mg/dL (ref 8–23)
CO2: 28 mmol/L (ref 22–32)
Calcium: 9.7 mg/dL (ref 8.9–10.3)
Chloride: 103 mmol/L (ref 98–111)
Creatinine, Ser: 0.75 mg/dL (ref 0.44–1.00)
GFR, Estimated: >60 mL/min (ref >60)
Glucose, Bld: 146 mg/dL — ABNORMAL HIGH (ref 70–99)
Potassium: 4.2 mmol/L (ref 3.5–5.1)
Sodium: 138 mmol/L (ref 135–145)

## 2021-04-28 MED ORDER — DEXAMETHASONE 4 MG PO TABS: 4.0000 mg | ORAL_TABLET | Freq: Two times a day (BID) | ORAL | 0 refills | Status: DC

## 2021-04-28 MED ORDER — LORATADINE 10 MG PO TABS
10.0000 mg | ORAL_TABLET | Freq: Every day | ORAL | Status: DC
  Administered 2021-04-28: 10 mg via ORAL
  Filled 2021-04-28: qty 1

## 2021-04-28 MED ORDER — LEVETIRACETAM 750 MG PO TABS: 1500.0000 mg | ORAL_TABLET | Freq: Two times a day (BID) | ORAL | 0 refills | Status: DC

## 2021-04-28 MED ORDER — LACOSAMIDE 100 MG PO TABS: 100.0000 mg | ORAL_TABLET | Freq: Two times a day (BID) | ORAL | 0 refills | Status: DC

## 2021-04-28 NOTE — Progress Notes (Addendum)
Patient ready for discharge to home; discharge instructions given and reviewed; Rx's sent electronically. Patient dressed with assistance and discharged out via wheelchair. Patient accompanied home by her daughter and son.

## 2021-04-28 NOTE — Progress Notes (Signed)
Ltm dced. No skin breakdown noted. Results pending.

## 2021-04-28 NOTE — Progress Notes (Signed)
04/28/21 0055  What Happened  Was fall witnessed? No  Was patient injured? Unsure  Patient found on floor  Found by Staff-comment  Stated prior activity other (comment) (using bathroom, did not wait for staff to assist, once walked to the BR)  Follow Up  MD notified Sheran Luz MD  Time MD notified [phone]  Family notified Yes - comment (Family in the room)  Time family notified 1 Marilu Favre)  Additional tests  (XRAY of hip and pelvis)  Simple treatment Other (comment) (tyenol for pre-existing pain in left hip per patient)  Progress note created (see row info) Yes  Adult Fall Risk Assessment  Risk Factor Category (scoring not indicated) Fall has occurred during this admission (document High fall risk);High fall risk per protocol (document High fall risk)  Patient Fall Risk Level High fall risk  Adult Fall Risk Interventions  Required Bundle Interventions *See Row Information* High fall risk - low, moderate, and high requirements implemented  Additional Interventions PT/OT need assessed if change in mobility from baseline;Family Supervision  Screening for Fall Injury Risk (To be completed on HIGH fall risk patients) - Assessing Need for Floor Mats  Risk For Fall Injury- Criteria for Floor Mats Previous fall this admission  Will Implement Floor Mats Yes  Vitals  Temp 98 F (36.7 C)  Temp Source Oral  BP (!) 145/69  MAP (mmHg) 90  BP Location Left Arm  BP Method Automatic  Patient Position (if appropriate) Lying  Pulse Rate (!) 57  Pulse Rate Source Monitor  Resp 14  Oxygen Therapy  SpO2 100 %  O2 Device Room Air  Pain Assessment  Pain Scale 0-10  Pain Score 3  Pain Type Chronic pain  Pain Location Hip  Pain Orientation Left;Lateral  Pain Frequency Intermittent  Pain Onset On-going  Patients Stated Pain Goal 0  Pain Intervention(s) Medication (See eMAR);RN made aware;Repositioned;MD notified (Comment)  Multiple Pain Sites No  Neurological  Neuro (WDL) X   Level of Consciousness Alert  Orientation Level Oriented X4  Cognition Appropriate at baseline;Follows commands  Speech Clear  Pupil Assessment  Yes  R Pupil Size (mm) 4  R Pupil Shape Round  R Pupil Reaction Brisk  L Pupil Size (mm) 4  L Pupil Shape Round  L Pupil Reaction Brisk  Facial Symmetry Symmetrical  R Hand Grip Moderate  L Hand Grip Moderate   Right Pronator Drift Absent  Left Pronator Drift Absent  R Foot Dorsiflexion Strong  L Foot Dorsiflexion Strong  R Foot Plantar Flexion Strong  L Foot Plantar Flexion Strong  RUE Motor Response Purposeful movement  RUE Sensation Full sensation  RUE Motor Strength 4  LUE Motor Response Purposeful movement  LUE Sensation Full sensation  LUE Motor Strength 4  RLE Motor Response Purposeful movement  RLE Sensation Full sensation  RLE Motor Strength 4  LLE Motor Response Purposeful movement  LLE Sensation Full sensation  LLE Motor Strength 4  Neuro Symptoms None  Seizure Activity  Psychomotor Symptoms None  Glasgow Coma Scale  Eye Opening 4  Best Verbal Response (NON-intubated) 5  Best Motor Response 6  Glasgow Coma Scale Score 15  NIH Stroke Scale ( + Modified Stroke Scale Criteria)   Interval Other (Comment) (post fall)  Level of Consciousness (1a.)    0  LOC Questions (1b. )   + 0  LOC Commands (1c. )   +  0  Best Gaze (2. )  + 0  Visual (3. )  +  0  Facial Palsy (4. )     0  Motor Arm, Left (5a. )   + 0  Motor Arm, Right (5b. )   + 0  Motor Leg, Left (6a. )   + 0  Motor Leg, Right (6b. )   + 0  Limb Ataxia (7. ) 0  Sensory (8. )   + 0  Best Language (9. )   + 0  Dysarthria (10. ) 0  Extinction/Inattention (11.)   + 0  Modified SS Total  + 0  Complete NIHSS TOTAL 0  Musculoskeletal  Musculoskeletal (WDL) WDL  Assistive Device Front wheel walker  Generalized Weakness Yes  Weight Bearing Restrictions No  Integumentary  Integumentary (WDL) WDL  Skin Color Appropriate for ethnicity  Skin Condition Dry   Skin Integrity Intact  Skin Turgor Non-tenting

## 2021-04-28 NOTE — Procedures (Addendum)
Patient Name: Hannah [name]  MRN: [id number]  Epilepsy Attending: Lora Havens  Referring Physician/Provider: Dr Donnetta Simpers Duration: 04/27/2021 0934 to 04/28/2021 1030   Patient history: 70 year old female with new onset seizures in the setting of worsening edema related to metastatic lesions.  EEG to evaluate for seizure.   Level of alertness: Awake, asleep   AEDs during EEG study: LEV, LCM   Technical aspects: This EEG study was done with scalp electrodes positioned according to the 10-20 International system of electrode placement. Electrical activity was acquired at a sampling rate of 500Hz  and reviewed with a high frequency filter of 70Hz  and a low frequency filter of 1Hz . EEG data were recorded continuously and digitally stored.    Description: The posterior dominant rhythm consists of 9 Hz activity of moderate voltage (25-35 uV) seen predominantly in posterior head regions, symmetric and reactive to eye opening and eye closing. Sleep was characterized by vertex waves, sleep spindles (12-14Hz ), maximal frontocentral region. EEG showed intermittent 3-5hz  theta-delta slowing admixed with sharp waves in right fronto-central region consistent with breach artifact.   Patient event button was pressed on 04/27/2021 at 1403. Patient described " a wave going across her head". Concomitant eeg before, during and after the event didn't show any eeg change to suggest seizure.    ABNORMALITY - Breach artifact, left fronto-central region - Spike, left fronto-central region   IMPRESSION: This study showed evidence of epileptogenicity and cortical dysfunction left frontocentral region consistent with history if craniotomy.  No seizures were seen during this study.   Patient event button was pressed on 04/27/2021 at 1403. Patient described " a wave going across her head". Concomitant eeg didn't show any eeg change. This was most likely NOT an epileptic event.

## 2021-04-28 NOTE — Progress Notes (Signed)
Brief Neuro Update:  No further seizures on LTM. Will continue Keppra 1500mg  BID and Vimpat 100mg  BID. We will signoff. Recommend follow up with Dr. Mickeal Skinner with Neuro-Oncology team outpatient.  Discussed with Dr. Broadus John over secure chat.

## 2021-04-29 ENCOUNTER — Inpatient Hospital Stay: Payer: Medicare HMO

## 2021-04-29 NOTE — Discharge Summary (Signed)
Physician Discharge Summary  Hannah [name] LFY:[id number] DOB: Jul 22, 1950 DOA: 04/22/2021  PCP: [name], MD  Admit date: 04/22/2021 Discharge date: 04/28/2021  Time spent: 35 minutes  Recommendations for Outpatient Follow-up:  Neurooncology Dr. Mickeal Skinner in [place] in 1 week, consider code status and goals of care discussions   Discharge Diagnoses:  Principal Problem:   Vasogenic brain edema ([place]) Active Problems:   Mixed hyperlipidemia   HTN (hypertension)   Diabetes mellitus without complication ([place])   Brain metastasis ([place])   Primary cancer of right upper lobe of lung ([place])   S/P robot-assisted surgical procedure   Acute encephalopathy   Seizure [facility])   Discharge Condition: stable  Diet recommendation: diabetic  Filed Weights   04/22/21 1644  Weight: 51.3 kg    History of present illness:  Hannah [name] is a 70 y.o. 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  She was noted to have seizures, which is new onset, as well she had an episode noted in ED as well, he was given Keppra 1 g, as well started on Decadron, and admitted to [facility] for further management. -MRI brain noted increase in size of lesions with surrounding edema, likely related to post XRT necrosis  Hospital Course: \  Seizures/altered mental status/vasogenic brain edema Brain metastasis -Prior history of XRT, SRS in 07/2020 and 11/2020  -MRI of the brain showing increasing size of the lesions(possibly related post stereotactic necrosis) with  surrounding edema. -Continue with Keppra, and Decadron, Dr. Waldron Labs discussed with Dr. Mickeal Skinner, recommended discharge on Keppra and Decadron, he will arrange outpatient follow-up -Neurology was consulted, they increased Keppra dose on admission, she was then placed on continuous EEG due to intermittent seizure-like episodes, this detected additional left frontal central focal seizures on 10/28, she was then started on Vimpat -Stable subsequently on this regimen, discharged home on oral Keppra, Vimpat and Decadron -Close follow-up with Dr. Mickeal Skinner in 7 to 10 days and Dr. Earlie Server in 1 week   Fever/Leukopenia -had one initally fever reading of 100.4 at outside ED. in the setting of worsening leukopenia she was treated with empiric vancomycin and cefepime. -Clinically do not suspect meningitis  -Blood cultures are negative X2 days, urine culture <10K colonies, flu and COVID PCR negative  -Afebrile since, antibiotics discontinued   Stage IV non-small cell right upper lobe lung cancer with brain metastasis  -diagnosed in February 2022 - s/p SRS, craniotomy and resection on solitary lesion in 07/2020 repeat SRS of two new brain mets 11/29/20  robot assisted right upper lobectomy on active chemotherapy -Followed by Dr. Earlie Server - Last chemotherapy session on 04/11/2021   HTN  -mildly elevated. Not on any hypertensives -Stable now, continue PRNs   Aortic sclerosis with insufficiency -Mild to moderate seen on echo in March 2022.   Hyperlipidemia Continue statin    Type 2 diabetes Controlled. Last hemoglobin A1c of 6 on 03/20/2021      Consultants:  Neurology D/w Neuro-oncology, Dr. Mickeal Skinner   Discharge Exam: Vitals:   04/28/21 0910 04/28/21 0912  BP: (!) 148/93 (!) 147/67  Pulse: 63 (!) 55  Resp: 18 18  Temp:  98.1 F (36.7 C)  SpO2:  100%   Gen: Gen: Awake, Alert, Oriented X 3,  HEENT: no JVD, EEG leads on Lungs: Good air movement bilaterally, CTAB CVS: S1S2/RRR Abd: soft, Non  tender, non distended, BS present Extremities: No edema Skin: no new rashes on exposed skin  Neuro: Moves all extremities, no localizing signs  Discharge Instructions   Discharge Instructions     Diet general   Complete by: As directed    Increase activity slowly   Complete by: As directed       Allergies as of 04/28/2021       Reactions   Amlodipine Swelling, Rash   Rash, swelling   Chantix [varenicline Tartrate] Shortness Of Breath, Swelling, Other (See Comments)   Tongue swell,sob   Clarithromycin Rash   Lisinopril Hives   Simvastatin Hives   Wellbutrin [bupropion Hcl] Hives   Lipitor [atorvastatin]    Dizziness per patient   Sertraline    Makes her feel like she is going to kill someone        Medication List     STOP taking these medications    senna-docusate 8.6-50 MG tablet Commonly known as: Senokot-S       TAKE these medications    acetaminophen 500 MG tablet Commonly known as: TYLENOL Take 2 tablets (1,000 mg total) by mouth every 6 (six) hours.   ALPRAZolam 0.5 MG tablet Commonly known as: XANAX 2  qam   2  @2 ;00 What changed:  how much to take how to take this when to take this additional instructions   ALPRAZolam 1 MG 24 hr tablet Commonly known as: XANAX XR 1  qhs What changed:  how much to take how to take this when to take this additional instructions   aspirin 81 MG tablet Take 1 tablet (81 mg total) by mouth daily. Restart on 08/31/20   BIOFREEZE EX Apply 1 application topically as needed (pain).   bisacodyl 5 MG EC tablet Commonly known as: DULCOLAX Take 5 mg by mouth daily as needed for moderate constipation.   carboxymethylcellulose 0.5 % Soln Commonly known as: REFRESH PLUS Place 1-2 drops into both eyes 3 (three) times daily as needed (dry eyes).   citalopram 10 MG tablet Commonly known as: CeleXA Take 1 tablet (10 mg total) by mouth daily.   D3 PO Take 1 capsule by mouth daily.   dexamethasone 4 MG  tablet Commonly known as: DECADRON Take 1 tablet (4 mg total) by mouth 2 (two) times daily.   folic acid 1 MG tablet Commonly known as: FOLVITE Take 1 tablet by mouth once daily   Lacosamide 100 MG Tabs Take 1 tablet (100 mg total) by mouth 2 (two) times daily.   levETIRAcetam 750 MG tablet Commonly known as: KEPPRA Take 2 tablets (1,500 mg total) by mouth 2 (two) times daily.   loratadine 10 MG tablet Commonly known as: CLARITIN Take 10 mg by mouth daily as needed for allergies.   mirtazapine 45 MG tablet Commonly known as: REMERON Take 1 tablet (45 mg total) by mouth at bedtime.   omeprazole 40 MG capsule Commonly known as: PRILOSEC TAKE 1 CAPSULE (40 MG TOTAL) BY MOUTH 2 (TWO) TIMES DAILY BEFORE A MEAL. What changed: when to take this   Pataday 0.1 % ophthalmic solution Generic drug: olopatadine Place 1 drop into both eyes 2 (two) times daily as needed for allergies.   polyethylene glycol 17 g packet Commonly known  as: MIRALAX / GLYCOLAX Take 17 g by mouth 2 (two) times daily.   pravastatin 40 MG tablet Commonly known as: PRAVACHOL TAKE 1 TABLET (40 MG TOTAL) BY MOUTH EVERY EVENING.   PROBIOTIC DAILY PO Take 1 capsule by mouth daily.   prochlorperazine 10 MG tablet Commonly known as: COMPAZINE Take 1 tablet (10 mg total) by mouth every 6 (six) hours as needed for nausea or vomiting.   simethicone 80 MG chewable tablet Commonly known as: MYLICON Chew 1 tablet (80 mg total) by mouth 4 (four) times daily as needed for flatulence (Bloating).   vitamin C 1000 MG tablet Take 1,000 mg by mouth daily.       Allergies  Allergen Reactions   Amlodipine Swelling and Rash    Rash, swelling   Chantix [Varenicline Tartrate] Shortness Of Breath, Swelling and Other (See Comments)    Tongue swell,sob   Clarithromycin Rash   Lisinopril Hives   Simvastatin Hives   Wellbutrin [Bupropion Hcl] Hives   Lipitor [Atorvastatin]     Dizziness per patient   Sertraline      Makes her feel like she is going to kill someone    Follow-up Information     Vaslow, Acey Lav, MD. Schedule an appointment as soon as possible for a visit in 10 day(s).   Specialties: Psychiatry, Neurology, Oncology Contact information: [place] [address] [phone]         Curt Bears, MD. Go in 2 week(s).   Specialty: Oncology Contact information: [place] [address] [phone]                  The results of significant diagnostics from this hospitalization (including imaging, microbiology, ancillary and laboratory) are listed below for reference.    Significant Diagnostic Studies: CT Head Wo Contrast  Result Date: 04/22/2021 CLINICAL DATA:  Initial evaluation for acute mental status change, history of metastatic lung carcinoma with brain involvement. Status post chemotherapy. EXAM: CT HEAD WITHOUT CONTRAST TECHNIQUE: Contiguous axial images were obtained from the base of the skull through the vertex without intravenous contrast. COMPARISON:  Previous CT from 02/13/2021 and brain MRI from 02/18/2021. FINDINGS: Brain: Postoperative changes from previous left frontal craniotomy are seen. Vasogenic edema involving the anterior left frontal lobe about the surgical resection site has progressed and worsened from prior exam, concerning for progressive disease. Additionally, there is progressive vasogenic edema at the left parieto-occipital region, presumably related to a previously identified subcentimeter metastatic implant at this location, also concerning for progressive disease. No other significant vasogenic edema. No significant midline shift. No hydrocephalus or ventricular trapping. Basilar cisterns remain patent. No definite new lesions evident by CT. No acute intracranial hemorrhage. No other visible acute large vessel territory infarct. No extra-axial fluid collection. Vascular: No hyperdense vessel. Skull: Prior  left frontal craniotomy. Calvarium otherwise intact. No scalp soft tissue abnormality. Sinuses/Orbits: Globes and orbital soft tissues demonstrate no acute finding. Left sphenoid sinus retention cyst noted. Paranasal sinuses and mastoid air cells are otherwise clear. Other: None. IMPRESSION: 1. Postoperative changes from previous left frontal craniotomy. Progressive vasogenic edema involving the anterior left frontal lobe about the surgical resection site, concerning for progressive disease. If the patient has undergone recent XRT, possible post radiation changes could also be considered. 2. Progressive vasogenic edema at the left parieto-occipital region, presumably related to a previously identified subcentimeter metastatic implant at this location, also concerning for progressive disease versus post XRT changes. 3. No other new  acute intracranial abnormality. Electronically Signed   By: Jeannine Boga M.D.   On: 04/22/2021 21:02   MR Brain W and Wo Contrast  Result Date: 04/23/2021 CLINICAL DATA:  Mental status change with unknown cause. History of brain metastases. EXAM: MRI HEAD WITHOUT AND WITH CONTRAST TECHNIQUE: Multiplanar, multiecho pulse sequences of the brain and surrounding structures were obtained without and with intravenous contrast. CONTRAST:  4.108mL GADAVIST GADOBUTROL 1 MMOL/ML IV SOLN COMPARISON:  Head CT from yesterday.  Brain MRI 02/18/2021 FINDINGS: Brain: Hazy, ill-defined masslike enhancement in the high left frontal lobe subjacent to the craniotomy, progressed to 19 mm in area on coronal postcontrast imaging. Significant interval increase in brain edema. More mildly increased size of enhancing 7 mm metastasis in the left parietal lobe, also with attendant increase in edema. No infarct, hemorrhage, hydrocephalus, or collection. Vascular: Normal flow voids and vascular enhancement Skull and upper cervical spine: Normal marrow signal. Cervical facet spurring. Sinuses/Orbits: Negative  IMPRESSION: Increased enhancement and vasogenic edema at the treated left frontal and left parietal metastases. Electronically Signed   By: Jorje Guild M.D.   On: 04/23/2021 04:45   DG Chest Portable 1 View  Result Date: 04/22/2021 CLINICAL DATA:  Evaluate for infection. EXAM: PORTABLE CHEST 1 VIEW COMPARISON:  Chest radiograph dated 02/12/2021 and CT dated 03/14/2021. FINDINGS: Postsurgical changes of the right lung with persistent tenting of the right hemidiaphragm and small right pleural effusion similar to prior exams. The left lung is clear. No pneumothorax. Stable cardiac silhouette. No acute osseous pathology. IMPRESSION: Stable postsurgical changes of the right lung with small right pleural effusion. No new consolidation. Electronically Signed   By: Anner Crete M.D.   On: 04/22/2021 20:44   EEG adult  Result Date: 04/23/2021 Lora Havens, MD     04/23/2021 11:33 AM Patient Name: Hannah [name] MRN: [id number] Epilepsy Attending: Lora Havens Referring Physician/Provider: Dr Ileene Musa Date: 04/23/2021 Duration: 23.58 mins Patient history: 70 year old female with new onset seizures in the setting of worsening edema related to metastatic lesions.  EEG to evaluate for seizure. Level of alertness: Awake AEDs during EEG study: LEV Technical aspects: This EEG study was done with scalp electrodes positioned according to the 10-20 International system of electrode placement. Electrical activity was acquired at a sampling rate of 500Hz  and reviewed with a high frequency filter of 70Hz  and a low frequency filter of 1Hz . EEG data were recorded continuously and digitally stored. Description: The posterior dominant rhythm consists of 9 Hz activity of moderate voltage (25-35 uV) seen predominantly in posterior head regions, symmetric and reactive to eye opening and eye closing. EEG showed lateralized periodic discharges in right hemisphere, maximal right frontal region every 2 to 3 seconds as  well as intermittent 2 to 3 Hz delta slowing in right hemisphere, maximal right frontal region. Photic driving was not seen during photic stimulation.  Hyperventilation was not performed.   ABNORMALITY - Lateralized periodic discharges, right hemisphere, maximal right frontal region. -Intermittent slow,right hemisphere, maximal right frontal region. IMPRESSION: This study showed evidence of epileptogenicity as well as cortical dysfunction in right hemisphere, maximal right frontal region likely due to underlying structural abnormality/metastatic disease/edema.  No seizures were seen throughout the recording. Priyanka Barbra Sarks   Overnight EEG with video  Result Date: 04/27/2021 Lora Havens, MD     04/28/2021  8:40 AM Patient Name: Hannah [name] MRN: [id number] Epilepsy Attending: Lora Havens Referring Physician/Provider: Dr Donnetta Simpers Duration: 04/26/2021 0934 to 04/27/2021  0934  Patient history: 70 year old female with new onset seizures in the setting of worsening edema related to metastatic lesions.  EEG to evaluate for seizure.  Level of alertness: Awake, asleep  AEDs during EEG study: LEV  Technical aspects: This EEG study was done with scalp electrodes positioned according to the 10-20 International system of electrode placement. Electrical activity was acquired at a sampling rate of 500Hz  and reviewed with a high frequency filter of 70Hz  and a low frequency filter of 1Hz . EEG data were recorded continuously and digitally stored.  Description: The posterior dominant rhythm consists of 9 Hz activity of moderate voltage (25-35 uV) seen predominantly in posterior head regions, symmetric and reactive to eye opening and eye closing. Sleep was characterized by vertex waves, sleep spindles (12-14Hz ), maximal frontocentral region. EEG showed intermittent 3-5hz  theta-delta slowing admixed with sharp waves in right fronto-central region consistent with breach artifact. One seizure without clinical  signs was noted on 04/26/2021 at 1445. During the seizure, eeg showed 13hz  beta activity in left fronto-central region which evolved into 7-8hz hz alpha activity admixed with sharp waves and eventually 2-3hz  delta slowing. Duration of seizure was about 2 minutes.  ABNORMALITY - Focal seizure without clinical signs, left fronto-central region - Breach artifact, left fronto-central region - Spike, left fronto-central region IMPRESSION: This study showed one seizure without clinical signs arising from left fronto-central region on 04/26/2021 at 144, lasting about 2 minutes. There is also evidence of epileptogenicity and cortical dysfunction left frontocentral region consistent with history if craniotomy.   [place]   DG HIP UNILAT WITH PELVIS 2-3 VIEWS LEFT  Result Date: 04/28/2021 CLINICAL DATA:  Weakness, fall EXAM: DG HIP (WITH OR WITHOUT PELVIS) 2-3V LEFT COMPARISON:  None. FINDINGS: Normal alignment. No fracture or dislocation. Hip joint spaces are preserved vascular calcifications are seen within the pelvis and medial thighs. Lumbar spinous process fixation plates are visualized. IMPRESSION: Negative. Electronically Signed   By: Fidela Salisbury M.D.   On: 04/28/2021 02:57    Microbiology: Recent Results (from the past 240 hour(s))  Resp Panel by RT-PCR (Flu A&B, Covid) Nasopharyngeal Swab     Status: None   Collection Time: 04/22/21  8:09 PM   Specimen: Nasopharyngeal Swab; Nasopharyngeal(NP) swabs in vial transport medium  Result Value Ref Range Status   SARS Coronavirus 2 by RT PCR NEGATIVE NEGATIVE Final    Comment: (NOTE) SARS-CoV-2 target nucleic acids are NOT DETECTED.  The SARS-CoV-2 RNA is generally detectable in upper respiratory specimens during the acute phase of infection. The lowest concentration of SARS-CoV-2 viral copies this assay can detect is 138 copies/mL. A negative result does not preclude SARS-Cov-2 infection and should not be used as the sole basis for treatment  or other patient management decisions. A negative result may occur with  improper specimen collection/handling, submission of specimen other than nasopharyngeal swab, presence of viral mutation(s) within the areas targeted by this assay, and inadequate number of viral copies(<138 copies/mL). A negative result must be combined with clinical observations, patient history, and epidemiological information. The expected result is Negative.  Fact Sheet for Patients:  EntrepreneurPulse.com.au  Fact Sheet for Healthcare Providers:  IncredibleEmployment.be  This test is no t yet approved or cleared by the [place] FDA and  has been authorized for detection and/or diagnosis of SARS-CoV-2 by FDA under an Emergency Use Authorization (EUA). This EUA will remain  in effect (meaning this test can be used) for the duration of the COVID-19 declaration under Section 564(b)(1) of  the Act, 21 U.S.C.section 360bbb-3(b)(1), unless the authorization is terminated  or revoked sooner.       Influenza A by PCR NEGATIVE NEGATIVE Final   Influenza B by PCR NEGATIVE NEGATIVE Final    Comment: (NOTE) The Xpert Xpress SARS-CoV-2/FLU/RSV plus assay is intended as an aid in the diagnosis of influenza from Nasopharyngeal swab specimens and should not be used as a sole basis for treatment. Nasal washings and aspirates are unacceptable for Xpert Xpress SARS-CoV-2/FLU/RSV testing.  Fact Sheet for Patients: EntrepreneurPulse.com.au  Fact Sheet for Healthcare Providers: IncredibleEmployment.be  This test is not yet approved or cleared by the [place] FDA and has been authorized for detection and/or diagnosis of SARS-CoV-2 by FDA under an Emergency Use Authorization (EUA). This EUA will remain in effect (meaning this test can be used) for the duration of the COVID-19 declaration under Section 564(b)(1) of the Act, 21 U.S.C. section  360bbb-3(b)(1), unless the authorization is terminated or revoked.  Performed at [facility], [address] [place], [place] [address]   Culture, blood (routine x 2)     Status: None   Collection Time: 04/23/21  2:10 AM   Specimen: BLOOD RIGHT FOREARM  Result Value Ref Range Status   Specimen Description BLOOD RIGHT FOREARM  Final   Special Requests   Final    BOTTLES DRAWN AEROBIC AND ANAEROBIC BACTEROIDES CACCAE   Culture   Final    NO GROWTH 5 DAYS Performed at [facility], [place] [address] [place], [place] [address]    Report Status 04/28/2021 FINAL  Final  Culture, blood (routine x 2)     Status: None   Collection Time: 04/23/21  2:19 AM   Specimen: BLOOD  Result Value Ref Range Status   Specimen Description BLOOD LEFT ANTECUBITAL  Final   Special Requests   Final    BOTTLES DRAWN AEROBIC AND ANAEROBIC Blood Culture adequate volume   Culture   Final    NO GROWTH 5 DAYS Performed at [facility], [place] [address], [place], [place] [address]    Report Status 04/28/2021 FINAL  Final  Urine Culture     Status: Abnormal   Collection Time: 04/23/21  2:38 AM   Specimen: Urine, Clean Catch  Result Value Ref Range Status   Specimen Description URINE, CLEAN CATCH  Final   Special Requests NONE  Final   Culture (A)  Final    <10,000 COLONIES/mL INSIGNIFICANT GROWTH Performed at [facility], [place] [address] [place],  [address]    Report Status 04/23/2021 FINAL  Final     Labs: Basic Metabolic Panel: Recent Labs  Lab 04/22/21 1700 04/23/21 0410 04/24/21 0352 04/25/21 0325 04/28/21 0338  NA 136 136 140 139 138  K 4.2 4.1 4.2 4.4 4.2  CL 100 102 108 107 103  CO2 28 26 24 26 28   GLUCOSE 108* 160* 140* 148* 146*  BUN 11 7* 10 12 15   CREATININE 0.63 0.66 0.66 0.69 0.75  CALCIUM 9.7 9.8 9.8 10.0 9.7   Liver Function Tests: Recent Labs  Lab 04/24/21 0352  AST 22  ALT 14  ALKPHOS 61  BILITOT 0.4  PROT 6.4*  ALBUMIN  2.8*   No results for input(s): LIPASE, AMYLASE in the last 168 hours. No results for input(s): AMMONIA in the last 168 hours. CBC: Recent Labs  Lab 04/22/21 1700 04/23/21 0410 04/24/21 0352 04/25/21 0325 04/28/21 0338  WBC 3.1* 1.8* 3.0* 5.9 7.7  HGB  9.2* 9.6* 8.8* 9.1* 9.9*  HCT 29.7* 30.4* 28.7* 29.7* 32.8*  MCV 88.7 87.9 89.4 90.0 90.9  PLT 206 221 213 228 339   Cardiac Enzymes: No results for input(s): CKTOTAL, CKMB, CKMBINDEX, TROPONINI in the last 168 hours. BNP: BNP (last 3 results) No results for input(s): BNP in the last 8760 hours.  ProBNP (last 3 results) No results for input(s): PROBNP in the last 8760 hours.  CBG: Recent Labs  Lab 04/22/21 1657 04/22/21 1659 04/22/21 1905  GLUCAP 56* 107* 105*       Signed:  Domenic Polite MD.  [facility] 04/29/2021, 2:38 PM

## 2021-04-30 ENCOUNTER — Inpatient Hospital Stay: Payer: Medicare HMO | Attending: Internal Medicine | Admitting: Internal Medicine

## 2021-04-30 ENCOUNTER — Other Ambulatory Visit: Payer: Self-pay

## 2021-04-30 VITALS — BP 144/77 | HR 54 | Temp 98.2°F | Resp 17 | Ht 60.0 in | Wt 112.2 lb

## 2021-04-30 DIAGNOSIS — I10 Essential (primary) hypertension: Secondary | ICD-10-CM | POA: Insufficient documentation

## 2021-04-30 DIAGNOSIS — K219 Gastro-esophageal reflux disease without esophagitis: Secondary | ICD-10-CM | POA: Insufficient documentation

## 2021-04-30 DIAGNOSIS — C3411 Malignant neoplasm of upper lobe, right bronchus or lung: Secondary | ICD-10-CM | POA: Diagnosis not present

## 2021-04-30 DIAGNOSIS — Z888 Allergy status to other drugs, medicaments and biological substances status: Secondary | ICD-10-CM | POA: Diagnosis not present

## 2021-04-30 DIAGNOSIS — C7931 Secondary malignant neoplasm of brain: Secondary | ICD-10-CM | POA: Insufficient documentation

## 2021-04-30 DIAGNOSIS — Z7952 Long term (current) use of systemic steroids: Secondary | ICD-10-CM | POA: Diagnosis not present

## 2021-04-30 DIAGNOSIS — R531 Weakness: Secondary | ICD-10-CM | POA: Insufficient documentation

## 2021-04-30 DIAGNOSIS — Z8719 Personal history of other diseases of the digestive system: Secondary | ICD-10-CM | POA: Insufficient documentation

## 2021-04-30 DIAGNOSIS — Z9221 Personal history of antineoplastic chemotherapy: Secondary | ICD-10-CM | POA: Insufficient documentation

## 2021-04-30 DIAGNOSIS — Z5111 Encounter for antineoplastic chemotherapy: Secondary | ICD-10-CM | POA: Insufficient documentation

## 2021-04-30 DIAGNOSIS — G936 Cerebral edema: Secondary | ICD-10-CM | POA: Diagnosis not present

## 2021-04-30 DIAGNOSIS — J341 Cyst and mucocele of nose and nasal sinus: Secondary | ICD-10-CM | POA: Insufficient documentation

## 2021-04-30 DIAGNOSIS — Z87891 Personal history of nicotine dependence: Secondary | ICD-10-CM | POA: Diagnosis not present

## 2021-04-30 DIAGNOSIS — Z5112 Encounter for antineoplastic immunotherapy: Secondary | ICD-10-CM | POA: Diagnosis not present

## 2021-04-30 DIAGNOSIS — E785 Hyperlipidemia, unspecified: Secondary | ICD-10-CM | POA: Diagnosis not present

## 2021-04-30 DIAGNOSIS — R5383 Other fatigue: Secondary | ICD-10-CM | POA: Insufficient documentation

## 2021-04-30 DIAGNOSIS — R569 Unspecified convulsions: Secondary | ICD-10-CM | POA: Diagnosis not present

## 2021-04-30 NOTE — Progress Notes (Signed)
[facility] [address] [place], [place] [address] [phone]   Interval Evaluation  Date of Service: 04/30/21 Patient Name: NASHIA REMUS Patient MRN: [id number] Patient DOB: [birth year] Provider: Ventura Sellers, MD  Identifying Statement:  SEVIN FARONE is a 70 y.o. female with brain metastasis   Primary Cancer:  Oncology History  Primary cancer of right upper lobe of lung ([facility])  08/07/2020 Initial Diagnosis   Primary cancer of right upper lobe of lung ([facility])   09/20/2020 Cancer Staging   Staging form: Lung, AJCC 8th Edition - Clinical: Stage IVB (cT3, cN0, cM1c) - Signed by Curt Bears, MD on 09/20/2020    11/08/2020 -  Chemotherapy   Patient is on Treatment Plan : LUNG CARBOplatin / Pemetrexed / Pembrolizumab q21d Induction x 4 cycles / Maintenance Pemetrexed + Pembrolizumab      Oncologic History 08/23/20: Pre-op SRS to L frontal mass Tammi Klippel) 08/24/20: Craniotomy, resection ([facility]) 11/29/20: Salvage SRS x2 Tammi Klippel)  Interval History:  CELESTER MORGAN presents today for follow up after recent hospitalization for seizures.  She and daughter describe episodes of right sided shaking, at least two, lasting several minutes.  Prior to this, she had complained of right sided weakness, gait difficulty and confusion for several days.  This may have been accompanied by a low grade fever.  She was admitted, placed on video EEG due to persistently poor mentation.  Upon discharge, she was on Keppra and Vimpat as well as decadron 4mg  BID.  Today she feels back to her normal baseline, functional and independent, though she is still very tired from the admission.    H+P (08/02/20) Patient presented to medical attention this past week with several days rapidly progressive speech impairment and right sided weakness.  She and her family describe difficulty putting complete sentences together, impaired use of right arm (for using fork, zipper,  brushing teeth), and dragging of the right leg while walking.  She never needed assistance to walk.  CNS imaging demonstrated left frontal mass, and decadron was started over the weekend 4mg  twice per day.  She feels improved with regards to the right sided weakness with the steroids.  Otherwise no seizures, headaches.  Medications: Current Outpatient Medications on File Prior to Visit  Medication Sig Dispense Refill   acetaminophen (TYLENOL) 500 MG tablet Take 2 tablets (1,000 mg total) by mouth every 6 (six) hours. 30 tablet 0   ALPRAZolam (XANAX XR) 1 MG 24 hr tablet 1  qhs (Patient taking differently: Take 1 mg by mouth at bedtime.) 30 tablet 4   ALPRAZolam (XANAX) 0.5 MG tablet 2  qam   2  @2 ;00 (Patient taking differently: Take 0.5-1 mg by mouth See admin instructions. 0.5mg  oral twice daily And 0.5mg  oral extra twice daily as needed for anxiety) 120 tablet 5   Ascorbic Acid (VITAMIN C) 1000 MG tablet Take 1,000 mg by mouth daily.     aspirin 81 MG tablet Take 1 tablet (81 mg total) by mouth daily. Restart on 08/31/20 30 tablet    bisacodyl (DULCOLAX) 5 MG EC tablet Take 5 mg by mouth daily as needed for moderate constipation.     carboxymethylcellulose (REFRESH PLUS) 0.5 % SOLN Place 1-2 drops into both eyes 3 (three) times daily as needed (dry eyes).     Cholecalciferol (D3 PO) Take 1 capsule by mouth daily.     citalopram (CELEXA) 10 MG tablet Take 1 tablet (10 mg total) by mouth  daily. 30 tablet 1   dexamethasone (DECADRON) 4 MG tablet Take 1 tablet (4 mg total) by mouth 2 (two) times daily. 60 tablet 0   folic acid (FOLVITE) 1 MG tablet Take 1 tablet by mouth once daily (Patient taking differently: Take 1 mg by mouth daily.) 30 tablet 0   lacosamide 100 MG TABS Take 1 tablet (100 mg total) by mouth 2 (two) times daily. 60 tablet 0   levETIRAcetam (KEPPRA) 750 MG tablet Take 2 tablets (1,500 mg total) by mouth 2 (two) times daily. 60 tablet 0   loratadine (CLARITIN) 10 MG tablet Take 10 mg  by mouth daily as needed for allergies. (Patient not taking: Reported on 04/24/2021)     Menthol, Topical Analgesic, (BIOFREEZE EX) Apply 1 application topically as needed (pain).     mirtazapine (REMERON) 45 MG tablet Take 1 tablet (45 mg total) by mouth at bedtime. 30 tablet 3   olopatadine (PATADAY) 0.1 % ophthalmic solution Place 1 drop into both eyes 2 (two) times daily as needed for allergies.     omeprazole (PRILOSEC) 40 MG capsule TAKE 1 CAPSULE (40 MG TOTAL) BY MOUTH 2 (TWO) TIMES DAILY BEFORE A MEAL. (Patient taking differently: Take 40 mg by mouth in the morning and at bedtime.) 180 capsule 0   polyethylene glycol (MIRALAX / GLYCOLAX) 17 g packet Take 17 g by mouth 2 (two) times daily. 72 each 0   pravastatin (PRAVACHOL) 40 MG tablet TAKE 1 TABLET (40 MG TOTAL) BY MOUTH EVERY EVENING. 90 tablet 0   Probiotic Product (PROBIOTIC DAILY PO) Take 1 capsule by mouth daily.     prochlorperazine (COMPAZINE) 10 MG tablet Take 1 tablet (10 mg total) by mouth every 6 (six) hours as needed for nausea or vomiting. 30 tablet 0   simethicone (MYLICON) 80 MG chewable tablet Chew 1 tablet (80 mg total) by mouth 4 (four) times daily as needed for flatulence (Bloating). 30 tablet 0   No current facility-administered medications on file prior to visit.    Allergies:  Allergies  Allergen Reactions   Amlodipine Swelling and Rash    Rash, swelling   Chantix [Varenicline Tartrate] Shortness Of Breath, Swelling and Other (See Comments)    Tongue swell,sob   Clarithromycin Rash   Lisinopril Hives   Simvastatin Hives   Wellbutrin [Bupropion Hcl] Hives   Lipitor [Atorvastatin]     Dizziness per patient   Sertraline     Makes her feel like she is going to kill someone   Past Medical History:  Past Medical History:  Diagnosis Date   Allergy    Anemia    Anxiety    Arthritis    Carpal tunnel syndrome    Constipation    senna C stool softeners help    Depression    Diverticulosis    Dyslipidemia     External hemorrhoids    GERD (gastroesophageal reflux disease)    Heart murmur    mild-moderate AR   Hiatal hernia    Hyperlipidemia    on meds    Hypertension    Internal hemorrhoids    lung ca with brain mets 07/2020   Osteoarthritis    Pre-diabetes    PVD (peripheral vascular disease) (HCC)    moderate carotid disease   RBBB    Smoker    Vocal cord polyps    Past Surgical History:  Past Surgical History:  Procedure Laterality Date   ABDOMINAL HYSTERECTOMY  9798   APPLICATION OF CRANIAL NAVIGATION  N/A 08/24/2020   Procedure: APPLICATION OF CRANIAL NAVIGATION;  Surgeon: Judith Part, MD;  Location: [facility];  Service: Neurosurgery;  Laterality: N/A;   COLONOSCOPY     COLONOSCOPY W/ POLYPECTOMY  2009   CRANIOTOMY Left 08/24/2020   Procedure: Left Craniotomy for Tumor Resection with Brainlab;  Surgeon: Judith Part, MD;  Location: [facility];  Service: Neurosurgery;  Laterality: Left;   HEMORRHOID SURGERY     INTERCOSTAL NERVE BLOCK Right 09/24/2020   Procedure: INTERCOSTAL NERVE BLOCK;  Surgeon: Melrose Nakayama, MD;  Location: [facility];  Service: Thoracic;  Laterality: Right;   JOINT REPLACEMENT     LYMPH NODE DISSECTION Right 09/24/2020   Procedure: LYMPH NODE DISSECTION;  Surgeon: Melrose Nakayama, MD;  Location: [facility];  Service: Thoracic;  Laterality: Right;   POLYPECTOMY     right total knee arthroplasty     Dr. Emeterio Reeve 06-04-18   [facility]  08/16/2009   TOTAL KNEE ARTHROPLASTY Left 02/13/2014   Procedure: TOTAL KNEE ARTHROPLASTY;  Surgeon: Alta Corning, MD;  Location: [facility];  Service: Orthopedics;  Laterality: Left;   TOTAL KNEE ARTHROPLASTY Right 06/04/2018   Procedure: RIGHT TOTAL KNEE ARTHROPLASTY;  Surgeon: Dorna Leitz, MD;  Location: [facility];  Service: Orthopedics;  Laterality: Right;  Adductor Block   TUBAL LIGATION     Social History:  Social History   Socioeconomic History   Marital status: Married    Spouse name: Not on file   Number of  children: 2   Years of education: Not on file   Highest education level: Not on file  Occupational History   Not on file  Tobacco Use   Smoking status: Former    Packs/day: 0.25    Years: 40.00    Pack years: 10.00    Types: Cigarettes    Quit date: 07/09/2020    Years since quitting: 0.8    Passive exposure: Never   Smokeless tobacco: Never   Tobacco comments:    PACK WILL LAST 3 days  Vaping Use   Vaping Use: Never used  Substance and Sexual Activity   Alcohol use: No   Drug use: No   Sexual activity: Not Currently  Other Topics Concern   Not on file  Social History Narrative   Not on file   Social Determinants of Health   Financial Resource Strain: Low Risk    Difficulty of Paying Living Expenses: Not hard at all  Food Insecurity: No Food Insecurity   Worried About Charity fundraiser in the Last Year: Never true   [place] in the Last Year: Never true  Transportation Needs: No Transportation Needs   Lack of Transportation (Medical): No   Lack of Transportation (Non-Medical): No  Physical Activity: Sufficiently Active   Days of Exercise per Week: 3 days   Minutes of Exercise per Session: 60 min  Stress: No Stress Concern Present   Feeling of Stress : Not at all  Social Connections: Socially Integrated   Frequency of Communication with Friends and Family: More than three times a week   Frequency of Social Gatherings with Friends and Family: More than three times a week   Attends Religious Services: More than 4 times per year   Active Member of [employer] or Organizations: Yes   Attends Music therapist: More than 4 times per year   Marital Status: Married  Human resources officer Violence: Not At Risk   Fear of Current or Ex-Partner:  No   Emotionally Abused: No   Physically Abused: No   Sexually Abused: No   Family History:  Family History  Problem Relation Age of Onset   Cancer Father    Prostate cancer Father    Diabetes Sister    Cancer Sister     Stroke Maternal Grandfather    Colitis Maternal Aunt    Breast cancer Maternal Aunt    Colon cancer Neg Hx    Colon polyps Neg Hx    Rectal cancer Neg Hx    Stomach cancer Neg Hx    Esophageal cancer Neg Hx     Review of Systems: Constitutional: Doesn't report fevers, chills or abnormal weight loss Eyes: Doesn't report blurriness of vision Ears, nose, mouth, throat, and face: Doesn't report sore throat Respiratory: Doesn't report cough, dyspnea or wheezes Cardiovascular: Doesn't report palpitation, chest discomfort  Gastrointestinal:  Doesn't report nausea, constipation, diarrhea GU: Doesn't report incontinence Skin: Doesn't report skin rashes Neurological: Per HPI Musculoskeletal: Doesn't report joint pain Behavioral/Psych: ++anxiety  Physical Exam: Vitals:   04/30/21 1156  BP: (!) 144/77  Pulse: (!) 54  Resp: 17  Temp: 98.2 F (36.8 C)  SpO2: 100%   KPS: 80. General: Alert, cooperative, pleasant, in no acute distress Head: Normal EENT: No conjunctival injection or scleral icterus.  Lungs: Resp effort normal Cardiac: Regular rate Abdomen: Non-distended abdomen Skin: No rashes cyanosis or petechiae. Extremities: No clubbing or edema  Neurologic Exam: Mental Status: Awake, alert, attentive to examiner. Oriented to self and environment. Language is intact with regards to fluency, comprehension.  Cranial Nerves: Visual acuity is grossly normal. Visual fields are full. Extra-ocular movements intact. No ptosis. Face is symmetric Motor: Tone and bulk are normal. Power is 5/5 throughout. Reflexes are symmetric, no pathologic reflexes present.  Sensory: Intact to light touch Gait: Independent  Labs: I have reviewed the data as listed    Component Value Date/Time   NA 138 04/28/2021 0338   K 4.2 04/28/2021 0338   CL 103 04/28/2021 0338   CO2 28 04/28/2021 0338   GLUCOSE 146 (H) 04/28/2021 0338   BUN 15 04/28/2021 0338   CREATININE 0.75 04/28/2021 0338    CREATININE 0.67 04/11/2021 1011   CREATININE 0.79 03/14/2020 1016   CALCIUM 9.7 04/28/2021 0338   PROT 6.4 (L) 04/24/2021 0352   ALBUMIN 2.8 (L) 04/24/2021 0352   AST 22 04/24/2021 0352   AST 27 04/11/2021 1011   ALT 14 04/24/2021 0352   ALT 13 04/11/2021 1011   ALKPHOS 61 04/24/2021 0352   BILITOT 0.4 04/24/2021 0352   BILITOT 0.3 04/11/2021 1011   GFRNONAA >60 04/28/2021 0338   GFRNONAA >60 04/11/2021 1011   GFRNONAA 75 03/14/2020 1016   GFRAA 87 03/14/2020 1016   Lab Results  Component Value Date   WBC 7.7 04/28/2021   NEUTROABS 2.8 04/11/2021   HGB 9.9 (L) 04/28/2021   HCT 32.8 (L) 04/28/2021   MCV 90.9 04/28/2021   PLT 339 04/28/2021    Imaging:  [facility] Clinician Interpretation: I have personally reviewed the CNS images as listed.  My interpretation, in the context of the patient's clinical presentation, is likely treatment effect  CT Head Wo Contrast  Result Date: 04/22/2021 CLINICAL DATA:  Initial evaluation for acute mental status change, history of metastatic lung carcinoma with brain involvement. Status post chemotherapy. EXAM: CT HEAD WITHOUT CONTRAST TECHNIQUE: Contiguous axial images were obtained from the base of the skull through the vertex without intravenous contrast. COMPARISON:  Previous  CT from 02/13/2021 and brain MRI from 02/18/2021. FINDINGS: Brain: Postoperative changes from previous left frontal craniotomy are seen. Vasogenic edema involving the anterior left frontal lobe about the surgical resection site has progressed and worsened from prior exam, concerning for progressive disease. Additionally, there is progressive vasogenic edema at the left parieto-occipital region, presumably related to a previously identified subcentimeter metastatic implant at this location, also concerning for progressive disease. No other significant vasogenic edema. No significant midline shift. No hydrocephalus or ventricular trapping. Basilar cisterns remain patent. No definite  new lesions evident by CT. No acute intracranial hemorrhage. No other visible acute large vessel territory infarct. No extra-axial fluid collection. Vascular: No hyperdense vessel. Skull: Prior left frontal craniotomy. Calvarium otherwise intact. No scalp soft tissue abnormality. Sinuses/Orbits: Globes and orbital soft tissues demonstrate no acute finding. Left sphenoid sinus retention cyst noted. Paranasal sinuses and mastoid air cells are otherwise clear. Other: None. IMPRESSION: 1. Postoperative changes from previous left frontal craniotomy. Progressive vasogenic edema involving the anterior left frontal lobe about the surgical resection site, concerning for progressive disease. If the patient has undergone recent XRT, possible post radiation changes could also be considered. 2. Progressive vasogenic edema at the left parieto-occipital region, presumably related to a previously identified subcentimeter metastatic implant at this location, also concerning for progressive disease versus post XRT changes. 3. No other new acute intracranial abnormality. Electronically Signed   By: Jeannine Boga M.D.   On: 04/22/2021 21:02   MR Brain W and Wo Contrast  Result Date: 04/23/2021 CLINICAL DATA:  Mental status change with unknown cause. History of brain metastases. EXAM: MRI HEAD WITHOUT AND WITH CONTRAST TECHNIQUE: Multiplanar, multiecho pulse sequences of the brain and surrounding structures were obtained without and with intravenous contrast. CONTRAST:  4.54mL GADAVIST GADOBUTROL 1 MMOL/ML IV SOLN COMPARISON:  Head CT from yesterday.  Brain MRI 02/18/2021 FINDINGS: Brain: Hazy, ill-defined masslike enhancement in the high left frontal lobe subjacent to the craniotomy, progressed to 19 mm in area on coronal postcontrast imaging. Significant interval increase in brain edema. More mildly increased size of enhancing 7 mm metastasis in the left parietal lobe, also with attendant increase in edema. No infarct,  hemorrhage, hydrocephalus, or collection. Vascular: Normal flow voids and vascular enhancement Skull and upper cervical spine: Normal marrow signal. Cervical facet spurring. Sinuses/Orbits: Negative IMPRESSION: Increased enhancement and vasogenic edema at the treated left frontal and left parietal metastases. Electronically Signed   By: Jorje Guild M.D.   On: 04/23/2021 04:45   DG Chest Portable 1 View  Result Date: 04/22/2021 CLINICAL DATA:  Evaluate for infection. EXAM: PORTABLE CHEST 1 VIEW COMPARISON:  Chest radiograph dated 02/12/2021 and CT dated 03/14/2021. FINDINGS: Postsurgical changes of the right lung with persistent tenting of the right hemidiaphragm and small right pleural effusion similar to prior exams. The left lung is clear. No pneumothorax. Stable cardiac silhouette. No acute osseous pathology. IMPRESSION: Stable postsurgical changes of the right lung with small right pleural effusion. No new consolidation. Electronically Signed   By: Anner Crete M.D.   On: 04/22/2021 20:44   EEG adult  Result Date: 04/23/2021 Lora Havens, MD     04/23/2021 11:33 AM Patient Name: LENNY BOUCHILLON MRN: [id number] Epilepsy Attending: Lora Havens Referring Physician/Provider: Dr Ileene Musa Date: 04/23/2021 Duration: 23.58 mins Patient history: 70 year old female with new onset seizures in the setting of worsening edema related to metastatic lesions.  EEG to evaluate for seizure. Level of alertness: Awake AEDs during EEG study: LEV  Technical aspects: This EEG study was done with scalp electrodes positioned according to the 10-20 International system of electrode placement. Electrical activity was acquired at a sampling rate of 500Hz  and reviewed with a high frequency filter of 70Hz  and a low frequency filter of 1Hz . EEG data were recorded continuously and digitally stored. Description: The posterior dominant rhythm consists of 9 Hz activity of moderate voltage (25-35 uV) seen predominantly in  posterior head regions, symmetric and reactive to eye opening and eye closing. EEG showed lateralized periodic discharges in right hemisphere, maximal right frontal region every 2 to 3 seconds as well as intermittent 2 to 3 Hz delta slowing in right hemisphere, maximal right frontal region. Photic driving was not seen during photic stimulation.  Hyperventilation was not performed.   ABNORMALITY - Lateralized periodic discharges, right hemisphere, maximal right frontal region. -Intermittent slow,right hemisphere, maximal right frontal region. IMPRESSION: This study showed evidence of epileptogenicity as well as cortical dysfunction in right hemisphere, maximal right frontal region likely due to underlying structural abnormality/metastatic disease/edema.  No seizures were seen throughout the recording. Priyanka Barbra Sarks   Overnight EEG with video  Result Date: 04/27/2021 Lora Havens, MD     04/28/2021  8:40 AM Patient Name: DANNICA BICKHAM MRN: [id number] Epilepsy Attending: Lora Havens Referring Physician/Provider: Dr Donnetta Simpers Duration: 04/26/2021 3545 to 04/27/2021 0934  Patient history: 70 year old female with new onset seizures in the setting of worsening edema related to metastatic lesions.  EEG to evaluate for seizure.  Level of alertness: Awake, asleep  AEDs during EEG study: LEV  Technical aspects: This EEG study was done with scalp electrodes positioned according to the 10-20 International system of electrode placement. Electrical activity was acquired at a sampling rate of 500Hz  and reviewed with a high frequency filter of 70Hz  and a low frequency filter of 1Hz . EEG data were recorded continuously and digitally stored.  Description: The posterior dominant rhythm consists of 9 Hz activity of moderate voltage (25-35 uV) seen predominantly in posterior head regions, symmetric and reactive to eye opening and eye closing. Sleep was characterized by vertex waves, sleep spindles (12-14Hz ),  maximal frontocentral region. EEG showed intermittent 3-5hz  theta-delta slowing admixed with sharp waves in right fronto-central region consistent with breach artifact. One seizure without clinical signs was noted on 04/26/2021 at 1445. During the seizure, eeg showed 13hz  beta activity in left fronto-central region which evolved into 7-8hz hz alpha activity admixed with sharp waves and eventually 2-3hz  delta slowing. Duration of seizure was about 2 minutes.  ABNORMALITY - Focal seizure without clinical signs, left fronto-central region - Breach artifact, left fronto-central region - Spike, left fronto-central region IMPRESSION: This study showed one seizure without clinical signs arising from left fronto-central region on 04/26/2021 at 144, lasting about 2 minutes. There is also evidence of epileptogenicity and cortical dysfunction left frontocentral region consistent with history if craniotomy.   [facility]   DG HIP UNILAT WITH PELVIS 2-3 VIEWS LEFT  Result Date: 04/28/2021 CLINICAL DATA:  Weakness, fall EXAM: DG HIP (WITH OR WITHOUT PELVIS) 2-3V LEFT COMPARISON:  None. FINDINGS: Normal alignment. No fracture or dislocation. Hip joint spaces are preserved vascular calcifications are seen within the pelvis and medial thighs. Lumbar spinous process fixation plates are visualized. IMPRESSION: Negative. Electronically Signed   By: Fidela Salisbury M.D.   On: 04/28/2021 02:57      Assessment/Plan Brain metastasis (HCC)  Mikael N Elko is clinically improved today following hospitalization for focal epilepsy and post-ictal encephalopathy.  She is doing well with the Keppra and Vimpat aside from some expected fatigue, no further seizure events.  Etiology is post-SRS inflammation, predominantly affecting the initially resected and treated lesions in the left frontal lobe.  Left parietal target is also inflamed, but to a lesser extent.   Recommended continuing Keppra 1000mg  BID and Vimpat 100mg   BID.  Decadron should decrease to 4mg /2mg  x7 days, then decrease to 4mg  daily.  She will con't on systemic therapy with Dr. Julien Nordmann, may resume chemo later this week  We appreciate the opportunity to participate in the care of [name].    We ask that [name] return to clinic in 1 months following next brain MRI, or sooner as needed.  All questions were answered. The patient knows to call the clinic with any problems, questions or concerns. No barriers to learning were detected.  The total time spent in the encounter was 40 minutes and more than 50% was on counseling and review of test results   Ventura Sellers, MD Medical Director of Neuro-Oncology [facility] 04/30/21 4:44 PM

## 2021-05-01 ENCOUNTER — Telehealth: Payer: Self-pay | Admitting: Medical Oncology

## 2021-05-01 NOTE — Telephone Encounter (Signed)
Should pt keep her appt tomorrow.   I told her yes.

## 2021-05-02 ENCOUNTER — Inpatient Hospital Stay (HOSPITAL_BASED_OUTPATIENT_CLINIC_OR_DEPARTMENT_OTHER): Payer: Medicare HMO | Admitting: Internal Medicine

## 2021-05-02 ENCOUNTER — Other Ambulatory Visit: Payer: Self-pay

## 2021-05-02 ENCOUNTER — Inpatient Hospital Stay: Payer: Medicare HMO

## 2021-05-02 ENCOUNTER — Encounter: Payer: Self-pay | Admitting: Internal Medicine

## 2021-05-02 VITALS — BP 150/73 | HR 54 | Temp 96.9°F | Resp 18 | Ht 60.0 in | Wt 108.8 lb

## 2021-05-02 DIAGNOSIS — G936 Cerebral edema: Secondary | ICD-10-CM | POA: Diagnosis not present

## 2021-05-02 DIAGNOSIS — K219 Gastro-esophageal reflux disease without esophagitis: Secondary | ICD-10-CM | POA: Diagnosis not present

## 2021-05-02 DIAGNOSIS — C7931 Secondary malignant neoplasm of brain: Secondary | ICD-10-CM

## 2021-05-02 DIAGNOSIS — Z5111 Encounter for antineoplastic chemotherapy: Secondary | ICD-10-CM

## 2021-05-02 DIAGNOSIS — R5383 Other fatigue: Secondary | ICD-10-CM | POA: Diagnosis not present

## 2021-05-02 DIAGNOSIS — Z5112 Encounter for antineoplastic immunotherapy: Secondary | ICD-10-CM

## 2021-05-02 DIAGNOSIS — C3411 Malignant neoplasm of upper lobe, right bronchus or lung: Secondary | ICD-10-CM

## 2021-05-02 DIAGNOSIS — R531 Weakness: Secondary | ICD-10-CM | POA: Diagnosis not present

## 2021-05-02 DIAGNOSIS — R569 Unspecified convulsions: Secondary | ICD-10-CM | POA: Diagnosis not present

## 2021-05-02 LAB — CMP (CANCER CENTER ONLY)
ALT: 36 U/L (ref 0–44)
AST: 23 U/L (ref 15–41)
Albumin: 3.7 g/dL (ref 3.5–5.0)
Alkaline Phosphatase: 64 U/L (ref 38–126)
Anion gap: 9 (ref 5–15)
BUN: 21 mg/dL (ref 8–23)
CO2: 29 mmol/L (ref 22–32)
Calcium: 9.5 mg/dL (ref 8.9–10.3)
Chloride: 100 mmol/L (ref 98–111)
Creatinine: 0.77 mg/dL (ref 0.44–1.00)
GFR, Estimated: >60 mL/min (ref >60)
Glucose, Bld: 122 mg/dL — ABNORMAL HIGH (ref 70–99)
Potassium: 4.6 mmol/L (ref 3.5–5.1)
Sodium: 138 mmol/L (ref 135–145)
Total Bilirubin: <0.2 mg/dL — ABNORMAL LOW (ref 0.3–1.2)
Total Protein: 7.4 g/dL (ref 6.5–8.1)

## 2021-05-02 LAB — CBC WITH DIFFERENTIAL (CANCER CENTER ONLY)
Abs Immature Granulocytes: 0.16 10*3/uL — ABNORMAL HIGH (ref 0.00–0.07)
Basophils Absolute: 0 10*3/uL (ref 0.0–0.1)
Basophils Relative: 0 %
Eosinophils Absolute: 0 10*3/uL (ref 0.0–0.5)
Eosinophils Relative: 0 %
HCT: 34.6 % — ABNORMAL LOW (ref 36.0–46.0)
Hemoglobin: 10.9 g/dL — ABNORMAL LOW (ref 12.0–15.0)
Immature Granulocytes: 2 %
Lymphocytes Relative: 13 %
Lymphs Abs: 1.3 10*3/uL (ref 0.7–4.0)
MCH: 27.6 pg (ref 26.0–34.0)
MCHC: 31.5 g/dL (ref 30.0–36.0)
MCV: 87.6 fL (ref 80.0–100.0)
Monocytes Absolute: 1.1 10*3/uL — ABNORMAL HIGH (ref 0.1–1.0)
Monocytes Relative: 11 %
Neutro Abs: 7.4 10*3/uL (ref 1.7–7.7)
Neutrophils Relative %: 74 %
Platelet Count: 370 10*3/uL (ref 150–400)
RBC: 3.95 MIL/uL (ref 3.87–5.11)
RDW: 14.3 % (ref 11.5–15.5)
WBC Count: 10 10*3/uL (ref 4.0–10.5)
nRBC: 0 % (ref 0.0–0.2)

## 2021-05-02 LAB — TSH: TSH: 0.722 u[IU]/mL (ref 0.308–3.960)

## 2021-05-02 NOTE — Progress Notes (Signed)
[place] [phone]   [phone]  OFFICE PROGRESS NOTE  [name], MD [name] [id number]  DIAGNOSIS: Stage IV non-small cell lung cancer (T, N0, M1C) adenocarcinoma.  The patient presented with a and solitary brain metastasis.  The patient was diagnosed in February 2022.  Biomarker Findings Microsatellite status - MS-Stable Tumor Mutational Burden - 8 Muts/Mb Genomic Findings For a complete list of the genes assayed, please refer to the Appendix. KRAS G12V KEAP1 S224F TP53 P151T 7 Disease relevant genes with no reportable alterations: ALK, BRAF, EGFR, ERBB2, MET, RET, ROS1  PDL1 Expression: 50%    PRIOR THERAPY: 1) SRS to the metastatic brain lesion on 08/23/2020 under the care of Dr. Tammi Klippel and craniotomy under the care of Dr. Zada Finders scheduled for 08/24/2020. 2) S/p robotic assisted right upper lobectomy with en bloc wedge resection of the right middle lobe and lymph node dissection under the care of Dr. Roxan Hockey on September 24, 2020. 3) status post SRS to a new subcentimeter brain lesions under the care of Dr. Lisbeth Renshaw.   CURRENT THERAPY: Systemic chemotherapy with carboplatin for AUC of 5, Alimta 500 Mg/M2 and Keytruda 200 mg IV every 3 weeks.  First dose Nov 07, 2020.  Status post 8 cycles.  Starting from cycle #5 the patient will be on maintenance treatment with Alimta and Keytruda every 3 weeks.   INTERVAL HISTORY: [name] 70 y.o. female returns to the clinic today for follow-up visit accompanied by her daughter.  The patient was admitted to [facility] few weeks ago with aphasia and also had some seizure activity.  She had MRI of the brain at that time that showed increase in the vasogenic edema around the previously treated tumor.  There was concern about disease progression and the patient was seen by Dr. Mickeal Skinner few days ago and started on treatment with Decadron 4 mg p.o. a.m. and 2 mg  p.o. p.m.  She is feeling a little bit better.  She denied having any current chest pain, shortness of breath, cough or hemoptysis.  She denied having any fever or chills.  She has no nausea, vomiting, diarrhea or constipation.  She has no headache or visual changes.  She was supposed to start cycle #9 of her chemotherapy today.  She is here for evaluation before her treatment.   MEDICAL HISTORY: Past Medical History:  Diagnosis Date   Allergy    Anemia    Anxiety    Arthritis    Carpal tunnel syndrome    Constipation    senna C stool softeners help    Depression    Diverticulosis    Dyslipidemia    External hemorrhoids    GERD (gastroesophageal reflux disease)    Heart murmur    mild-moderate AR   Hiatal hernia    Hyperlipidemia    on meds    Hypertension    Internal hemorrhoids    lung ca with brain mets 07/2020   Osteoarthritis    Pre-diabetes    PVD (peripheral vascular disease) (HCC)    moderate carotid disease   RBBB    Smoker    Vocal cord polyps     ALLERGIES:  is allergic to amlodipine, chantix [varenicline tartrate], clarithromycin, lisinopril, simvastatin, wellbutrin [bupropion hcl], lipitor [atorvastatin], and sertraline.  MEDICATIONS:  Current Outpatient Medications  Medication Sig Dispense Refill   acetaminophen (TYLENOL) 500 MG tablet Take 2 tablets (1,000 mg total) by  mouth every 6 (six) hours. 30 tablet 0   ALPRAZolam (XANAX XR) 1 MG 24 hr tablet 1  qhs (Patient taking differently: Take 1 mg by mouth at bedtime.) 30 tablet 4   ALPRAZolam (XANAX) 0.5 MG tablet 2  qam   2  _0 ;00 (Patient taking differently: Take 0.5-1 mg by mouth See admin instructions. 0.44m oral twice daily And 0.539moral extra twice daily as needed for anxiety) 120 tablet 5   Ascorbic Acid (VITAMIN C) 1000 MG tablet Take 1,000 mg by mouth daily.     aspirin 81 MG tablet Take 1 tablet (81 mg total) by mouth daily. Restart on 08/31/20 30 tablet    bisacodyl (DULCOLAX) 5 MG EC tablet Take 5  mg by mouth daily as needed for moderate constipation.     carboxymethylcellulose (REFRESH PLUS) 0.5 % SOLN Place 1-2 drops into both eyes 3 (three) times daily as needed (dry eyes).     Cholecalciferol (D3 PO) Take 1 capsule by mouth daily.     citalopram (CELEXA) 10 MG tablet Take 1 tablet (10 mg total) by mouth daily. 30 tablet 1   dexamethasone (DECADRON) 4 MG tablet Take 1 tablet (4 mg total) by mouth 2 (two) times daily. 60 tablet 0   folic acid (FOLVITE) 1 MG tablet Take 1 tablet by mouth once daily (Patient taking differently: Take 1 mg by mouth daily.) 30 tablet 0   lacosamide 100 MG TABS Take 1 tablet (100 mg total) by mouth 2 (two) times daily. 60 tablet 0   levETIRAcetam (KEPPRA) 750 MG tablet Take 2 tablets (1,500 mg total) by mouth 2 (two) times daily. 60 tablet 0   loratadine (CLARITIN) 10 MG tablet Take 10 mg by mouth daily as needed for allergies. (Patient not taking: Reported on 04/24/2021)     Menthol, Topical Analgesic, (BIOFREEZE EX) Apply 1 application topically as needed (pain).     mirtazapine (REMERON) 45 MG tablet Take 1 tablet (45 mg total) by mouth at bedtime. 30 tablet 3   olopatadine (PATADAY) 0.1 % ophthalmic solution Place 1 drop into both eyes 2 (two) times daily as needed for allergies.     omeprazole (PRILOSEC) 40 MG capsule TAKE 1 CAPSULE (40 MG TOTAL) BY MOUTH 2 (TWO) TIMES DAILY BEFORE A MEAL. (Patient taking differently: Take 40 mg by mouth in the morning and at bedtime.) 180 capsule 0   polyethylene glycol (MIRALAX / GLYCOLAX) 17 g packet Take 17 g by mouth 2 (two) times daily. 72 each 0   pravastatin (PRAVACHOL) 40 MG tablet TAKE 1 TABLET (40 MG TOTAL) BY MOUTH EVERY EVENING. 90 tablet 0   Probiotic Product (PROBIOTIC DAILY PO) Take 1 capsule by mouth daily.     prochlorperazine (COMPAZINE) 10 MG tablet Take 1 tablet (10 mg total) by mouth every 6 (six) hours as needed for nausea or vomiting. 30 tablet 0   simethicone (MYLICON) 80 MG chewable tablet Chew 1  tablet (80 mg total) by mouth 4 (four) times daily as needed for flatulence (Bloating). 30 tablet 0   No current facility-administered medications for this visit.    SURGICAL HISTORY:  Past Surgical History:  Procedure Laterality Date   ABDOMINAL HYSTERECTOMY  193300 APPLICATION OF CRANIAL NAVIGATION N/A 08/24/2020   Procedure: APPLICATION OF CRANIAL NAVIGATION;  Surgeon: OsJudith PartMD;  [facility] Service: Neurosurgery;  Laterality: N/A;   COLONOSCOPY     COLONOSCOPY W/ POLYPECTOMY  2009   CRANIOTOMY Left 08/24/2020  Procedure: Left Craniotomy for Tumor Resection with Brainlab;  Surgeon: Judith Part, MD;  Location: [facility];  Service: Neurosurgery;  Laterality: Left;   HEMORRHOID SURGERY     INTERCOSTAL NERVE BLOCK Right 09/24/2020   Procedure: INTERCOSTAL NERVE BLOCK;  Surgeon: Melrose Nakayama, MD;  Location: [facility];  Service: Thoracic;  Laterality: Right;   JOINT REPLACEMENT     LYMPH NODE DISSECTION Right 09/24/2020   Procedure: LYMPH NODE DISSECTION;  Surgeon: Melrose Nakayama, MD;  Location: [facility];  Service: Thoracic;  Laterality: Right;   POLYPECTOMY     right total knee arthroplasty     Dr. Emeterio Reeve 06-04-18   [place]  08/16/2009   TOTAL KNEE ARTHROPLASTY Left 02/13/2014   Procedure: TOTAL KNEE ARTHROPLASTY;  Surgeon: Alta Corning, MD;  Location: [facility];  Service: Orthopedics;  Laterality: Left;   TOTAL KNEE ARTHROPLASTY Right 06/04/2018   Procedure: RIGHT TOTAL KNEE ARTHROPLASTY;  Surgeon: Dorna Leitz, MD;  Location: [facility];  Service: Orthopedics;  Laterality: Right;  Adductor Block   TUBAL LIGATION      REVIEW OF SYSTEMS:  Constitutional: positive for fatigue Eyes: negative Ears, nose, mouth, throat, and face: negative Respiratory: negative Cardiovascular: negative Gastrointestinal: negative Genitourinary:negative Integument/breast: negative Hematologic/lymphatic: negative Musculoskeletal:negative Neurological: positive for  weakness Behavioral/Psych: negative Endocrine: negative Allergic/Immunologic: negative   PHYSICAL EXAMINATION: General appearance: alert, cooperative, fatigued, and no distress Head: Normocephalic, without obvious abnormality, atraumatic Neck: no adenopathy, no JVD, supple, symmetrical, trachea midline, and thyroid not enlarged, symmetric, no tenderness/mass/nodules Lymph nodes: Cervical, supraclavicular, and axillary nodes normal. Resp: clear to auscultation bilaterally Back: symmetric, no curvature. ROM normal. No CVA tenderness. Cardio: regular rate and rhythm, S1, S2 normal, no murmur, click, rub or gallop GI: soft, non-tender; bowel sounds normal; no masses,  no organomegaly Extremities: extremities normal, atraumatic, no cyanosis or edema Neurologic: Alert and oriented X 3, normal strength and tone. Normal symmetric reflexes. Normal coordination and gait  ECOG PERFORMANCE STATUS: 1 - Symptomatic but completely ambulatory  Blood pressure (!) 150/73, pulse (!) 54, temperature (!) 96.9 F (36.1 C), temperature source Tympanic, resp. rate 18, height 5' (1.524 m), weight 108 lb 12.8 oz (49.4 kg), SpO2 100 %.  LABORATORY DATA: Lab Results  Component Value Date   WBC 10.0 05/02/2021   HGB 10.9 (L) 05/02/2021   HCT 34.6 (L) 05/02/2021   MCV 87.6 05/02/2021   PLT 370 05/02/2021      Chemistry      Component Value Date/Time   NA 138 05/02/2021 0952   K 4.6 05/02/2021 0952   CL 100 05/02/2021 0952   CO2 29 05/02/2021 0952   BUN 21 05/02/2021 0952   CREATININE 0.77 05/02/2021 0952   CREATININE 0.79 03/14/2020 1016      Component Value Date/Time   CALCIUM 9.5 05/02/2021 0952   ALKPHOS 64 05/02/2021 0952   AST 23 05/02/2021 0952   ALT 36 05/02/2021 0952   BILITOT <0.2 (L) 05/02/2021 0952       RADIOGRAPHIC STUDIES: CT Head Wo Contrast  Result Date: 04/22/2021 CLINICAL DATA:  Initial evaluation for acute mental status change, history of metastatic lung carcinoma with  brain involvement. Status post chemotherapy. EXAM: CT HEAD WITHOUT CONTRAST TECHNIQUE: Contiguous axial images were obtained from the base of the skull through the vertex without intravenous contrast. COMPARISON:  Previous CT from 02/13/2021 and brain MRI from 02/18/2021. FINDINGS: Brain: Postoperative changes from previous left frontal craniotomy are seen. Vasogenic edema involving the anterior left frontal lobe  about the surgical resection site has progressed and worsened from prior exam, concerning for progressive disease. Additionally, there is progressive vasogenic edema at the left parieto-occipital region, presumably related to a previously identified subcentimeter metastatic implant at this location, also concerning for progressive disease. No other significant vasogenic edema. No significant midline shift. No hydrocephalus or ventricular trapping. Basilar cisterns remain patent. No definite new lesions evident by CT. No acute intracranial hemorrhage. No other visible acute large vessel territory infarct. No extra-axial fluid collection. Vascular: No hyperdense vessel. Skull: Prior left frontal craniotomy. Calvarium otherwise intact. No scalp soft tissue abnormality. Sinuses/Orbits: Globes and orbital soft tissues demonstrate no acute finding. Left sphenoid sinus retention cyst noted. Paranasal sinuses and mastoid air cells are otherwise clear. Other: None. IMPRESSION: 1. Postoperative changes from previous left frontal craniotomy. Progressive vasogenic edema involving the anterior left frontal lobe about the surgical resection site, concerning for progressive disease. If the patient has undergone recent XRT, possible post radiation changes could also be considered. 2. Progressive vasogenic edema at the left parieto-occipital region, presumably related to a previously identified subcentimeter metastatic implant at this location, also concerning for progressive disease versus post XRT changes. 3. No other new  acute intracranial abnormality. Electronically Signed   By: Jeannine Boga M.D.   On: 04/22/2021 21:02   MR Brain W and Wo Contrast  Result Date: 04/23/2021 CLINICAL DATA:  Mental status change with unknown cause. History of brain metastases. EXAM: MRI HEAD WITHOUT AND WITH CONTRAST TECHNIQUE: Multiplanar, multiecho pulse sequences of the brain and surrounding structures were obtained without and with intravenous contrast. CONTRAST:  4.77m GADAVIST GADOBUTROL 1 MMOL/ML IV SOLN COMPARISON:  Head CT from yesterday.  Brain MRI 02/18/2021 FINDINGS: Brain: Hazy, ill-defined masslike enhancement in the high left frontal lobe subjacent to the craniotomy, progressed to 19 mm in area on coronal postcontrast imaging. Significant interval increase in brain edema. More mildly increased size of enhancing 7 mm metastasis in the left parietal lobe, also with attendant increase in edema. No infarct, hemorrhage, hydrocephalus, or collection. Vascular: Normal flow voids and vascular enhancement Skull and upper cervical spine: Normal marrow signal. Cervical facet spurring. Sinuses/Orbits: Negative IMPRESSION: Increased enhancement and vasogenic edema at the treated left frontal and left parietal metastases. Electronically Signed   By: JJorje GuildM.D.   On: 04/23/2021 04:45   DG Chest Portable 1 View  Result Date: 04/22/2021 CLINICAL DATA:  Evaluate for infection. EXAM: PORTABLE CHEST 1 VIEW COMPARISON:  Chest radiograph dated 02/12/2021 and CT dated 03/14/2021. FINDINGS: Postsurgical changes of the right lung with persistent tenting of the right hemidiaphragm and small right pleural effusion similar to prior exams. The left lung is clear. No pneumothorax. Stable cardiac silhouette. No acute osseous pathology. IMPRESSION: Stable postsurgical changes of the right lung with small right pleural effusion. No new consolidation. Electronically Signed   By: AAnner CreteM.D.   On: 04/22/2021 20:44   EEG  adult  Result Date: 04/23/2021 YLora Havens MD     04/23/2021 11:33 AM Patient Name: Hannah [name]: [id number] Attending: PLora HavensReferring Physician/Provider: Dr CIleene MusaDate: 04/23/2021 Duration: 23.58 mins Patient history: 70year old female with new onset seizures in the setting of worsening edema related to metastatic lesions.  EEG to evaluate for seizure. Level of alertness: Awake AEDs during EEG study: LEV Technical aspects: This EEG study was done with scalp electrodes positioned according to the 10-20 International system of electrode placement. Electrical activity was acquired at a sampling  rate of _0  and reviewed with a high frequency filter of _1  and a low frequency filter of _2 . EEG data were recorded continuously and digitally stored. Description: The posterior dominant rhythm consists of 9 Hz activity of moderate voltage (25-35 uV) seen predominantly in posterior head regions, symmetric and reactive to eye opening and eye closing. EEG showed lateralized periodic discharges in right hemisphere, maximal right frontal region every 2 to 3 seconds as well as intermittent 2 to 3 Hz delta slowing in right hemisphere, maximal right frontal region. Photic driving was not seen during photic stimulation.  Hyperventilation was not performed.   ABNORMALITY - Lateralized periodic discharges, right hemisphere, maximal right frontal region. -Intermittent slow,right hemisphere, maximal right frontal region. IMPRESSION: This study showed evidence of epileptogenicity as well as cortical dysfunction in right hemisphere, maximal right frontal region likely due to underlying structural abnormality/metastatic disease/edema.  No seizures were seen throughout the recording. Priyanka Barbra Sarks   Overnight EEG with video  Result Date: 04/27/2021 Lora Havens, MD     04/28/2021  8:40 AM Patient Name: Hannah [name] MRN: [id number] Epilepsy Attending: Lora Havens Referring  Physician/Provider: Dr Donnetta Simpers Duration: 04/26/2021 2992 to 04/27/2021 0934  Patient history: 70 year old female with new onset seizures in the setting of worsening edema related to metastatic lesions.  EEG to evaluate for seizure.  Level of alertness: Awake, asleep  AEDs during EEG study: LEV  Technical aspects: This EEG study was done with scalp electrodes positioned according to the 10-20 International system of electrode placement. Electrical activity was acquired at a sampling rate of _3  and reviewed with a high frequency filter of _4  and a low frequency filter of _5 . EEG data were recorded continuously and digitally stored.  Description: The posterior dominant rhythm consists of 9 Hz activity of moderate voltage (25-35 uV) seen predominantly in posterior head regions, symmetric and reactive to eye opening and eye closing. Sleep was characterized by vertex waves, sleep spindles (12-_6 ), maximal frontocentral region. EEG showed intermittent 3-_7  theta-delta slowing admixed with sharp waves in right fronto-central region consistent with breach artifact. One seizure without clinical signs was noted on 04/26/2021 at 1445. During the seizure, eeg showed _8  beta activity in left fronto-central region which evolved into 7-_9 hz alpha activity admixed with sharp waves and eventually 2-_10  delta slowing. Duration of seizure was about 2 minutes.  ABNORMALITY - Focal seizure without clinical signs, left fronto-central region - Breach artifact, left fronto-central region - Spike, left fronto-central region IMPRESSION: This study showed one seizure without clinical signs arising from left fronto-central region on 04/26/2021 at 144, lasting about 2 minutes. There is also evidence of epileptogenicity and cortical dysfunction left frontocentral region consistent with history if craniotomy.   [place]   DG HIP UNILAT WITH PELVIS 2-3 VIEWS LEFT  Result Date: 04/28/2021 CLINICAL DATA:  Weakness,  fall EXAM: DG HIP (WITH OR WITHOUT PELVIS) 2-3V LEFT COMPARISON:  None. FINDINGS: Normal alignment. No fracture or dislocation. Hip joint spaces are preserved vascular calcifications are seen within the pelvis and medial thighs. Lumbar spinous process fixation plates are visualized. IMPRESSION: Negative. Electronically Signed   By: Fidela Salisbury M.D.   On: 04/28/2021 02:57     ASSESSMENT AND PLAN: This is a very pleasant 70 years old African-American female diagnosed with a stage IV (T3, N0, M1b) non-small cell lung cancer, adenocarcinoma presented with right upper lobe lung mass with solitary brain metastasis diagnosed in February 2022 status post SRS to the brain lesion  followed by craniotomy and resection. The patient also has a solitary lung mass. S/p robotic assisted right upper lobectomy with en bloc wedge resection of the right middle lobe and lymph node dissection under the care of Dr. Roxan Hockey on September 24, 2020. She also underwent SRS treatment to a new subcentimeter brain lesions under the care of Dr. Lisbeth Renshaw She is currently undergoing systemic chemotherapy with carboplatin for AUC of 5, Alimta 500 Mg/M2 and Keytruda 200 Mg IV every 3 weeks status post 8 cycles.  Starting from cycle #5 the patient will be on maintenance treatment with Alimta and Keytruda every 3 weeks. The patient continues to tolerate this treatment fairly well with no concerning complaints but recently she was found to have significant vasogenic edema around the previously treated tumor with some concern about disease progression. She was seen by Dr. Mickeal Skinner and started treatment with Decadron 4 mg p.o. every morning and 2 mg p.o. every afternoon for the vasogenic edema. She continues to have increasing fatigue and weakness. I recommended for the patient to hold her treatment with chemotherapy for the cycle until we taper her Decadron dose to around 2 mg p.o. daily. I will see her back for follow-up visit in 3 weeks for  evaluation before resuming her treatment. The patient was advised to call immediately if she has any other concerning symptoms in the interval. The patient voices understanding of current disease status and treatment options and is in agreement with the current care plan.  All questions were answered. The patient knows to call the clinic with any problems, questions or concerns. We can certainly see the patient much sooner if necessary.  Disclaimer: This note was dictated with voice recognition software. Similar sounding words can inadvertently be transcribed and may not be corrected upon review.

## 2021-05-03 ENCOUNTER — Encounter: Payer: Self-pay | Admitting: Internal Medicine

## 2021-05-03 ENCOUNTER — Ambulatory Visit (HOSPITAL_COMMUNITY)
Admission: RE | Admit: 2021-05-03 | Payer: Medicare HMO | Source: Ambulatory Visit | Attending: Cardiology | Admitting: Cardiology

## 2021-05-06 ENCOUNTER — Other Ambulatory Visit: Payer: Self-pay | Admitting: Thoracic Surgery (Cardiothoracic Vascular Surgery)

## 2021-05-06 ENCOUNTER — Other Ambulatory Visit (HOSPITAL_COMMUNITY): Payer: Self-pay | Admitting: Psychiatry

## 2021-05-06 ENCOUNTER — Other Ambulatory Visit: Payer: Self-pay | Admitting: Internal Medicine

## 2021-05-06 ENCOUNTER — Telehealth: Payer: Self-pay | Admitting: Internal Medicine

## 2021-05-06 DIAGNOSIS — Z9889 Other specified postprocedural states: Secondary | ICD-10-CM

## 2021-05-06 DIAGNOSIS — R918 Other nonspecific abnormal finding of lung field: Secondary | ICD-10-CM

## 2021-05-06 NOTE — Telephone Encounter (Signed)
Scheduled follow-up appointment per 11/3 los. Patient's daughter is aware.

## 2021-05-07 ENCOUNTER — Ambulatory Visit
Admission: RE | Admit: 2021-05-07 | Discharge: 2021-05-07 | Disposition: A | Discharge status: Home / Self Care | Payer: Medicare HMO | Source: Ambulatory Visit | Attending: Thoracic Surgery (Cardiothoracic Vascular Surgery) | Admitting: Thoracic Surgery (Cardiothoracic Vascular Surgery)

## 2021-05-07 ENCOUNTER — Ambulatory Visit (INDEPENDENT_AMBULATORY_CARE_PROVIDER_SITE_OTHER): Payer: Medicare HMO | Admitting: Thoracic Surgery (Cardiothoracic Vascular Surgery)

## 2021-05-07 ENCOUNTER — Other Ambulatory Visit: Payer: Self-pay

## 2021-05-07 VITALS — BP 122/61 | HR 70 | Resp 20 | Ht 60.0 in | Wt 112.0 lb

## 2021-05-07 DIAGNOSIS — M47814 Spondylosis without myelopathy or radiculopathy, thoracic region: Secondary | ICD-10-CM | POA: Diagnosis not present

## 2021-05-07 DIAGNOSIS — Z9889 Other specified postprocedural states: Secondary | ICD-10-CM

## 2021-05-07 DIAGNOSIS — R918 Other nonspecific abnormal finding of lung field: Secondary | ICD-10-CM

## 2021-05-07 DIAGNOSIS — C3411 Malignant neoplasm of upper lobe, right bronchus or lung: Secondary | ICD-10-CM

## 2021-05-07 NOTE — Progress Notes (Signed)
[address]       [place] [address]             [phone]     HPI: Hannah [name] returns for a scheduled follow-up visit  Hannah [name] is a 70 year old woman with a history of tobacco abuse who was diagnosed earlier this year with stage IV lung cancer.  She presented with expressive aphasia and right arm weakness.  She was found to have a brain metastasis.  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  She had a craniotomy and resection.  She had neurological recovery.  She then underwent a robotic right upper lobectomy in March.  She was treated with adjuvant chemotherapy with cisplatin, Alimta, and Keytruda.  She was found to have another small brain lesion which was treated with stereotactic radiation.  She was scheduled to have cycle 9 of chemotherapy last week but was unable to due to a seizure.  That also manifested as expressive aphasia.  She is feeling well.  Her weight is relatively stable.  Appetite is fair.  No surgical pain.  Respiratory status is stable.  Past Medical History:  Diagnosis Date   Allergy    Anemia    Anxiety    Arthritis    Carpal tunnel syndrome    Constipation    senna C stool softeners help    Depression    Diverticulosis    Dyslipidemia    External hemorrhoids    GERD (gastroesophageal reflux disease)    Heart murmur    mild-moderate AR   Hiatal hernia    Hyperlipidemia    on meds    Hypertension    Internal hemorrhoids    lung ca with brain mets 07/2020   Osteoarthritis    Pre-diabetes    PVD (peripheral vascular disease) (HCC)    moderate carotid disease   RBBB    Smoker    Vocal cord polyps     Current Outpatient Medications  Medication Sig Dispense Refill   acetaminophen (TYLENOL) 500 MG tablet Take 2 tablets (1,000 mg total) by mouth every 6 (six) hours. 30 tablet 0   ALPRAZolam (XANAX XR) 1 MG 24 hr tablet 1  qhs (Patient taking differently: Take 1 mg by mouth at bedtime.) 30 tablet 4    ALPRAZolam (XANAX) 0.5 MG tablet 2  qam   2  @2 ;00 (Patient taking differently: Take 0.5-1 mg by mouth See admin instructions. 0.5mg  oral twice daily And 0.5mg  oral extra twice daily as needed for anxiety) 120 tablet 5   Ascorbic Acid (VITAMIN C) 1000 MG tablet Take 1,000 mg by mouth daily.     aspirin 81 MG tablet Take 1 tablet (81 mg total) by mouth daily. Restart on 08/31/20 30 tablet    bisacodyl (DULCOLAX) 5 MG EC tablet Take 5 mg by mouth daily as needed for moderate constipation.     carboxymethylcellulose (REFRESH PLUS) 0.5 % SOLN Place 1-2 drops into both eyes 3 (three) times daily as needed (dry eyes).     Cholecalciferol (D3 PO) Take 1 capsule by mouth daily.     citalopram (CELEXA) 10 MG tablet Take 1 tablet (10 mg total) by mouth daily. 30 tablet 1   dexamethasone (DECADRON) 4 MG tablet Take 1 tablet (4 mg total) by mouth 2 (two) times daily. 60 tablet 0   folic acid (FOLVITE) 1 MG tablet Take 1 tablet by mouth once daily 30 tablet 0   lacosamide 100 MG  TABS Take 1 tablet (100 mg total) by mouth 2 (two) times daily. 60 tablet 0   levETIRAcetam (KEPPRA) 750 MG tablet Take 2 tablets (1,500 mg total) by mouth 2 (two) times daily. 60 tablet 0   loratadine (CLARITIN) 10 MG tablet Take 10 mg by mouth daily as needed for allergies.     Menthol, Topical Analgesic, (BIOFREEZE EX) Apply 1 application topically as needed (pain).     mirtazapine (REMERON) 45 MG tablet Take 1 tablet (45 mg total) by mouth at bedtime. 30 tablet 3   olopatadine (PATANOL) 0.1 % ophthalmic solution Place 1 drop into both eyes 2 (two) times daily as needed for allergies.     omeprazole (PRILOSEC) 40 MG capsule TAKE 1 CAPSULE (40 MG TOTAL) BY MOUTH 2 (TWO) TIMES DAILY BEFORE A MEAL. (Patient taking differently: Take 40 mg by mouth in the morning and at bedtime.) 180 capsule 0   polyethylene glycol (MIRALAX / GLYCOLAX) 17 g packet Take 17 g by mouth 2 (two) times daily. 72 each 0   pravastatin (PRAVACHOL) 40 MG tablet TAKE  1 TABLET (40 MG TOTAL) BY MOUTH EVERY EVENING. 90 tablet 0   Probiotic Product (PROBIOTIC DAILY PO) Take 1 capsule by mouth daily.     prochlorperazine (COMPAZINE) 10 MG tablet Take 1 tablet (10 mg total) by mouth every 6 (six) hours as needed for nausea or vomiting. 30 tablet 0   simethicone (MYLICON) 80 MG chewable tablet Chew 1 tablet (80 mg total) by mouth 4 (four) times daily as needed for flatulence (Bloating). 30 tablet 0   No current facility-administered medications for this visit.    Physical Exam BP 122/61   Pulse 70   Resp 20   Ht 5' (1.524 m)   Wt 112 lb (50.8 kg)   SpO2 95% Comment: RA  BMI 21.44 kg/m  70 year old woman in no acute distress Alert and oriented x3 with no focal deficits Lungs slightly diminished to right base but otherwise clear Cardiac regular rate and rhythm No cervical or supraclavicular adenopathy  Diagnostic Tests: CHEST - 2 VIEW   COMPARISON:  Radiographs 04/22/2021 and 02/12/2021. CT chest 03/14/2021.   FINDINGS: The heart size and mediastinal contours are stable. There are stable postsurgical changes related to right upper lobectomy. The left lung is clear. There is no pleural effusion or pneumothorax. Postsurgical changes are noted in the lumbar spine. There are degenerative changes throughout the thoracic spine. No acute osseous findings.   IMPRESSION: Stable postoperative chest status post right upper lobe resection. No acute findings or evidence of metastatic disease.     Electronically Signed   By: Richardean Sale M.D.   On: 05/07/2021 15:37 I personally reviewed the chest x-ray.  It shows postoperative changes.  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  She underwent craniotomy and resection of an isolated brain metastasis.  After she recovered from that she had a robotic assisted right upper lobectomy and node dissection.  She then was  treated with adjuvant chemotherapy and immune therapy.  In the interim she developed a small second brain lesion which was treated with stereotactic radiation.  Currently she is doing well from a surgical perspective.  She does not have any pain related to her lobectomy.  She is not having any respiratory issues.  Plan: Continue to follow-up with Dr. Julien Nordmann I will be happy to see Hannah [name] back anytime in the future  if I can be of any further assistance with her care  Melrose Nakayama, MD [facility] [phone]

## 2021-05-10 ENCOUNTER — Telehealth: Payer: Self-pay | Admitting: *Deleted

## 2021-05-10 NOTE — Telephone Encounter (Signed)
PC to patient's daughter [name], advised her of message below per Dr. Mickeal Skinner.  She verbalizes understanding.

## 2021-05-10 NOTE — Telephone Encounter (Signed)
-----   Message from Ventura Sellers, MD sent at 05/10/2021 11:48 AM EST ----- This is a very common side effect of decadron, not the seizure meds ----- Message ----- From: Rolene Course, RN Sent: 05/10/2021  10:44 AM EST To: Ventura Sellers, MD  This patient's daughter called & states she noticed that Ms Rennie's cheeks are "slightly puffy" and is asking if this could be a side effect of her seizure medication.  I see that she is on decadron which I know could possibly cause this.  What is your advice?  Thanks, Bethena Roys

## 2021-05-14 ENCOUNTER — Ambulatory Visit: Payer: Medicare HMO | Admitting: Thoracic Surgery (Cardiothoracic Vascular Surgery)

## 2021-05-21 ENCOUNTER — Ambulatory Visit: Payer: Medicare HMO | Admitting: Internal Medicine

## 2021-05-21 ENCOUNTER — Telehealth (HOSPITAL_COMMUNITY): Payer: Self-pay

## 2021-05-21 NOTE — Telephone Encounter (Signed)
Received a fax from [facility]  requesting a refill on patient's Citalopram 10mg . Follow up appointment scheduled for 07/24/21. Please review and advise. Thank you

## 2021-05-22 ENCOUNTER — Encounter: Payer: Self-pay | Admitting: Internal Medicine

## 2021-05-22 ENCOUNTER — Inpatient Hospital Stay: Payer: Medicare HMO | Admitting: Internal Medicine

## 2021-05-22 ENCOUNTER — Other Ambulatory Visit: Payer: Self-pay

## 2021-05-22 ENCOUNTER — Inpatient Hospital Stay: Payer: Medicare HMO

## 2021-05-22 ENCOUNTER — Other Ambulatory Visit (HOSPITAL_COMMUNITY): Payer: Self-pay

## 2021-05-22 VITALS — BP 131/70 | HR 86 | Temp 98.4°F | Resp 18 | Ht 60.0 in | Wt 117.6 lb

## 2021-05-22 DIAGNOSIS — M79605 Pain in left leg: Secondary | ICD-10-CM

## 2021-05-22 DIAGNOSIS — C3411 Malignant neoplasm of upper lobe, right bronchus or lung: Secondary | ICD-10-CM

## 2021-05-22 DIAGNOSIS — I739 Peripheral vascular disease, unspecified: Secondary | ICD-10-CM

## 2021-05-22 DIAGNOSIS — C7931 Secondary malignant neoplasm of brain: Secondary | ICD-10-CM | POA: Diagnosis not present

## 2021-05-22 DIAGNOSIS — K219 Gastro-esophageal reflux disease without esophagitis: Secondary | ICD-10-CM | POA: Diagnosis not present

## 2021-05-22 DIAGNOSIS — Z720 Tobacco use: Secondary | ICD-10-CM

## 2021-05-22 DIAGNOSIS — R5383 Other fatigue: Secondary | ICD-10-CM | POA: Diagnosis not present

## 2021-05-22 DIAGNOSIS — R531 Weakness: Secondary | ICD-10-CM | POA: Diagnosis not present

## 2021-05-22 DIAGNOSIS — I10 Essential (primary) hypertension: Secondary | ICD-10-CM

## 2021-05-22 DIAGNOSIS — I6523 Occlusion and stenosis of bilateral carotid arteries: Secondary | ICD-10-CM

## 2021-05-22 DIAGNOSIS — C3490 Malignant neoplasm of unspecified part of unspecified bronchus or lung: Secondary | ICD-10-CM | POA: Diagnosis not present

## 2021-05-22 DIAGNOSIS — Z5111 Encounter for antineoplastic chemotherapy: Secondary | ICD-10-CM

## 2021-05-22 DIAGNOSIS — Z5112 Encounter for antineoplastic immunotherapy: Secondary | ICD-10-CM | POA: Diagnosis not present

## 2021-05-22 DIAGNOSIS — G936 Cerebral edema: Secondary | ICD-10-CM | POA: Diagnosis not present

## 2021-05-22 DIAGNOSIS — R569 Unspecified convulsions: Secondary | ICD-10-CM | POA: Diagnosis not present

## 2021-05-22 LAB — CBC WITH DIFFERENTIAL (CANCER CENTER ONLY)
Abs Immature Granulocytes: 0.08 10*3/uL — ABNORMAL HIGH (ref 0.00–0.07)
Basophils Absolute: 0 10*3/uL (ref 0.0–0.1)
Basophils Relative: 0 %
Eosinophils Absolute: 0 10*3/uL (ref 0.0–0.5)
Eosinophils Relative: 1 %
HCT: 34.6 % — ABNORMAL LOW (ref 36.0–46.0)
Hemoglobin: 10.7 g/dL — ABNORMAL LOW (ref 12.0–15.0)
Immature Granulocytes: 1 %
Lymphocytes Relative: 13 %
Lymphs Abs: 0.8 10*3/uL (ref 0.7–4.0)
MCH: 28.2 pg (ref 26.0–34.0)
MCHC: 30.9 g/dL (ref 30.0–36.0)
MCV: 91.3 fL (ref 80.0–100.0)
Monocytes Absolute: 0.6 10*3/uL (ref 0.1–1.0)
Monocytes Relative: 10 %
Neutro Abs: 4.5 10*3/uL (ref 1.7–7.7)
Neutrophils Relative %: 75 %
Platelet Count: 163 10*3/uL (ref 150–400)
RBC: 3.79 MIL/uL — ABNORMAL LOW (ref 3.87–5.11)
RDW: 14.6 % (ref 11.5–15.5)
WBC Count: 6 10*3/uL (ref 4.0–10.5)
nRBC: 0 % (ref 0.0–0.2)

## 2021-05-22 LAB — CMP (CANCER CENTER ONLY)
ALT: 20 U/L (ref 0–44)
AST: 16 U/L (ref 15–41)
Albumin: 3.4 g/dL — ABNORMAL LOW (ref 3.5–5.0)
Alkaline Phosphatase: 70 U/L (ref 38–126)
Anion gap: 9 (ref 5–15)
BUN: 13 mg/dL (ref 8–23)
CO2: 24 mmol/L (ref 22–32)
Calcium: 9 mg/dL (ref 8.9–10.3)
Chloride: 104 mmol/L (ref 98–111)
Creatinine: 0.74 mg/dL (ref 0.44–1.00)
GFR, Estimated: >60 mL/min (ref >60)
Glucose, Bld: 205 mg/dL — ABNORMAL HIGH (ref 70–99)
Potassium: 4.3 mmol/L (ref 3.5–5.1)
Sodium: 137 mmol/L (ref 135–145)
Total Bilirubin: <0.2 mg/dL — ABNORMAL LOW (ref 0.3–1.2)
Total Protein: 6.9 g/dL (ref 6.5–8.1)

## 2021-05-22 LAB — TSH: TSH: 0.492 u[IU]/mL (ref 0.308–3.960)

## 2021-05-22 MED ORDER — SODIUM CHLORIDE 0.9 % IV SOLN
200.0000 mg | Freq: Once | INTRAVENOUS | Status: AC
  Administered 2021-05-22: 200 mg via INTRAVENOUS
  Filled 2021-05-22: qty 8

## 2021-05-22 MED ORDER — CYANOCOBALAMIN 1000 MCG/ML IJ SOLN
1000.0000 ug | Freq: Once | INTRAMUSCULAR | Status: AC
  Administered 2021-05-22: 1000 ug via INTRAMUSCULAR
  Filled 2021-05-22: qty 1

## 2021-05-22 MED ORDER — SODIUM CHLORIDE 0.9 % IV SOLN
500.0000 mg/m2 | Freq: Once | INTRAVENOUS | Status: AC
  Administered 2021-05-22: 700 mg via INTRAVENOUS
  Filled 2021-05-22: qty 8

## 2021-05-22 MED ORDER — CITALOPRAM HYDROBROMIDE 10 MG PO TABS: 10.0000 mg | ORAL_TABLET | Freq: Every day | ORAL | 1 refills | Status: DC

## 2021-05-22 MED ORDER — PROCHLORPERAZINE MALEATE 10 MG PO TABS
10.0000 mg | ORAL_TABLET | Freq: Once | ORAL | Status: AC
  Administered 2021-05-22: 10 mg via ORAL
  Filled 2021-05-22: qty 1

## 2021-05-22 MED ORDER — SODIUM CHLORIDE 0.9 % IV SOLN: Freq: Once | INTRAVENOUS | Status: AC

## 2021-05-22 NOTE — Progress Notes (Signed)
[place] [phone]   [phone]  OFFICE PROGRESS NOTE  [name], MD [place] [address]  DIAGNOSIS: Stage IV non-small cell lung cancer (T, N0, M1C) adenocarcinoma.  The patient presented with a and solitary brain metastasis.  The patient was diagnosed in February 2022.  Biomarker Findings Microsatellite status - MS-Stable Tumor Mutational Burden - 8 Muts/Mb Genomic Findings For a complete list of the genes assayed, please refer to the Appendix. KRAS G12V KEAP1 S224F TP53 P151T 7 Disease relevant genes with no reportable alterations: ALK, BRAF, EGFR, ERBB2, MET, RET, ROS1  PDL1 Expression: 50%    PRIOR THERAPY: 1) SRS to the metastatic brain lesion on 08/23/2020 under the care of Dr. Tammi Klippel and craniotomy under the care of Dr. Zada Finders scheduled for 08/24/2020. 2) S/p robotic assisted right upper lobectomy with en bloc wedge resection of the right middle lobe and lymph node dissection under the care of Dr. Roxan Hockey on September 24, 2020. 3) status post SRS to a new subcentimeter brain lesions under the care of Dr. Lisbeth Renshaw.   CURRENT THERAPY: Systemic chemotherapy with carboplatin for AUC of 5, Alimta 500 Mg/M2 and Keytruda 200 mg IV every 3 weeks.  First dose Nov 07, 2020.  Status post 8 cycles.  Starting from cycle #5 the patient will be on maintenance treatment with Alimta and Keytruda every 3 weeks.   INTERVAL HISTORY: Hannah [name] 70 y.o. female returns to the clinic today for follow-up visit accompanied by her daughter.  The patient is feeling fine today with no concerning complaints except for mild fatigue.  She is currently on a taper dose of Decadron 2 mg p.o. daily and tolerating it well.  She denied having any current chest pain, shortness of breath, cough or hemoptysis.  She denied having any fever or chills.  She has no nausea, vomiting, diarrhea or constipation.  She has no headache or visual  changes.  She is here today to resume her treatment with cycle #9.   MEDICAL HISTORY: Past Medical History:  Diagnosis Date   Allergy    Anemia    Anxiety    Arthritis    Carpal tunnel syndrome    Constipation    senna C stool softeners help    Depression    Diverticulosis    Dyslipidemia    External hemorrhoids    GERD (gastroesophageal reflux disease)    Heart murmur    mild-moderate AR   Hiatal hernia    Hyperlipidemia    on meds    Hypertension    Internal hemorrhoids    lung ca with brain mets 07/2020   Osteoarthritis    Pre-diabetes    PVD (peripheral vascular disease) (HCC)    moderate carotid disease   RBBB    Smoker    Vocal cord polyps     ALLERGIES:  is allergic to amlodipine, chantix [varenicline tartrate], clarithromycin, lisinopril, simvastatin, wellbutrin [bupropion hcl], lipitor [atorvastatin], and sertraline.  MEDICATIONS:  Current Outpatient Medications  Medication Sig Dispense Refill   acetaminophen (TYLENOL) 500 MG tablet Take 2 tablets (1,000 mg total) by mouth every 6 (six) hours. 30 tablet 0   ALPRAZolam (XANAX XR) 1 MG 24 hr tablet 1  qhs (Patient taking differently: Take 1 mg by mouth at bedtime.) 30 tablet 4   ALPRAZolam (XANAX) 0.5 MG tablet 2  qam   2  _0 ;00 (Patient taking differently: Take 0.5-1 mg by mouth See admin  instructions. 0.29m oral twice daily And 0.562moral extra twice daily as needed for anxiety) 120 tablet 5   Ascorbic Acid (VITAMIN C) 1000 MG tablet Take 1,000 mg by mouth daily.     aspirin 81 MG tablet Take 1 tablet (81 mg total) by mouth daily. Restart on 08/31/20 30 tablet    bisacodyl (DULCOLAX) 5 MG EC tablet Take 5 mg by mouth daily as needed for moderate constipation.     carboxymethylcellulose (REFRESH PLUS) 0.5 % SOLN Place 1-2 drops into both eyes 3 (three) times daily as needed (dry eyes).     Cholecalciferol (D3 PO) Take 1 capsule by mouth daily.     citalopram (CELEXA) 10 MG tablet Take 1 tablet (10 mg total) by  mouth daily. 30 tablet 1   dexamethasone (DECADRON) 4 MG tablet Take 1 tablet (4 mg total) by mouth 2 (two) times daily. 60 tablet 0   folic acid (FOLVITE) 1 MG tablet Take 1 tablet by mouth once daily 30 tablet 0   lacosamide 100 MG TABS Take 1 tablet (100 mg total) by mouth 2 (two) times daily. 60 tablet 0   levETIRAcetam (KEPPRA) 750 MG tablet Take 2 tablets (1,500 mg total) by mouth 2 (two) times daily. 60 tablet 0   loratadine (CLARITIN) 10 MG tablet Take 10 mg by mouth daily as needed for allergies.     Menthol, Topical Analgesic, (BIOFREEZE EX) Apply 1 application topically as needed (pain).     mirtazapine (REMERON) 45 MG tablet Take 1 tablet (45 mg total) by mouth at bedtime. 30 tablet 3   olopatadine (PATANOL) 0.1 % ophthalmic solution Place 1 drop into both eyes 2 (two) times daily as needed for allergies.     omeprazole (PRILOSEC) 40 MG capsule TAKE 1 CAPSULE (40 MG TOTAL) BY MOUTH 2 (TWO) TIMES DAILY BEFORE A MEAL. (Patient taking differently: Take 40 mg by mouth in the morning and at bedtime.) 180 capsule 0   polyethylene glycol (MIRALAX / GLYCOLAX) 17 g packet Take 17 g by mouth 2 (two) times daily. 72 each 0   pravastatin (PRAVACHOL) 40 MG tablet TAKE 1 TABLET (40 MG TOTAL) BY MOUTH EVERY EVENING. 90 tablet 0   Probiotic Product (PROBIOTIC DAILY PO) Take 1 capsule by mouth daily.     prochlorperazine (COMPAZINE) 10 MG tablet Take 1 tablet (10 mg total) by mouth every 6 (six) hours as needed for nausea or vomiting. 30 tablet 0   simethicone (MYLICON) 80 MG chewable tablet Chew 1 tablet (80 mg total) by mouth 4 (four) times daily as needed for flatulence (Bloating). 30 tablet 0   No current facility-administered medications for this visit.    SURGICAL HISTORY:  Past Surgical History:  Procedure Laterality Date   ABDOMINAL HYSTERECTOMY  195638 APPLICATION OF CRANIAL NAVIGATION N/A 08/24/2020   Procedure: APPLICATION OF CRANIAL NAVIGATION;  Surgeon: OsJudith PartMD;   [facility] Service: Neurosurgery;  Laterality: N/A;   COLONOSCOPY     COLONOSCOPY W/ POLYPECTOMY  2009   CRANIOTOMY Left 08/24/2020   Procedure: Left Craniotomy for Tumor Resection with Brainlab;  Surgeon: OsJudith PartMD;  [facility] Service: Neurosurgery;  Laterality: Left;   HEMORRHOID SURGERY     INTERCOSTAL NERVE BLOCK Right 09/24/2020   Procedure: INTERCOSTAL NERVE BLOCK;  Surgeon: HeMelrose NakayamaMD;  [facility] Service: Thoracic;  Laterality: Right;   JOINT REPLACEMENT     LYMPH NODE DISSECTION Right 09/24/2020  Procedure: LYMPH NODE DISSECTION;  Surgeon: Melrose Nakayama, MD;  Location: [facility] OR;  Service: Thoracic;  Laterality: Right;   POLYPECTOMY     right total knee arthroplasty     Dr. Emeterio Reeve 06-04-18   [place]  08/16/2009   TOTAL KNEE ARTHROPLASTY Left 02/13/2014   Procedure: TOTAL KNEE ARTHROPLASTY;  Surgeon: Alta Corning, MD;  Location: [facility];  Service: Orthopedics;  Laterality: Left;   TOTAL KNEE ARTHROPLASTY Right 06/04/2018   Procedure: RIGHT TOTAL KNEE ARTHROPLASTY;  Surgeon: Dorna Leitz, MD;  Location: [facility];  Service: Orthopedics;  Laterality: Right;  Adductor Block   TUBAL LIGATION      REVIEW OF SYSTEMS:  A comprehensive review of systems was negative except for: Constitutional: positive for fatigue   PHYSICAL EXAMINATION: General appearance: alert, cooperative, fatigued, and no distress Head: Normocephalic, without obvious abnormality, atraumatic Neck: no adenopathy, no JVD, supple, symmetrical, trachea midline, and thyroid not enlarged, symmetric, no tenderness/mass/nodules Lymph nodes: Cervical, supraclavicular, and axillary nodes normal. Resp: clear to auscultation bilaterally Back: symmetric, no curvature. ROM normal. No CVA tenderness. Cardio: regular rate and rhythm, S1, S2 normal, no murmur, click, rub or gallop GI: soft, non-tender; bowel sounds normal; no masses,  no organomegaly Extremities: extremities  normal, atraumatic, no cyanosis or edema  ECOG PERFORMANCE STATUS: 1 - Symptomatic but completely ambulatory  Blood pressure 131/70, pulse 86, temperature 98.4 F (36.9 C), temperature source Oral, resp. rate 18, height 5' (1.524 m), weight 117 lb 9.6 oz (53.3 kg), SpO2 100 %.  LABORATORY DATA: Lab Results  Component Value Date   WBC 6.0 05/22/2021   HGB 10.7 (L) 05/22/2021   HCT 34.6 (L) 05/22/2021   MCV 91.3 05/22/2021   PLT 163 05/22/2021      Chemistry      Component Value Date/Time   NA 138 05/02/2021 0952   K 4.6 05/02/2021 0952   CL 100 05/02/2021 0952   CO2 29 05/02/2021 0952   BUN 21 05/02/2021 0952   CREATININE 0.77 05/02/2021 0952   CREATININE 0.79 03/14/2020 1016      Component Value Date/Time   CALCIUM 9.5 05/02/2021 0952   ALKPHOS 64 05/02/2021 0952   AST 23 05/02/2021 0952   ALT 36 05/02/2021 0952   BILITOT <0.2 (L) 05/02/2021 0952       RADIOGRAPHIC STUDIES: DG Chest 2 View  Result Date: 05/07/2021 CLINICAL DATA:  History of lymph node dissection 09/18/2020. EXAM: CHEST - 2 VIEW COMPARISON:  Radiographs 04/22/2021 and 02/12/2021. CT chest 03/14/2021. FINDINGS: The heart size and mediastinal contours are stable. There are stable postsurgical changes related to right upper lobectomy. The left lung is clear. There is no pleural effusion or pneumothorax. Postsurgical changes are noted in the lumbar spine. There are degenerative changes throughout the thoracic spine. No acute osseous findings. IMPRESSION: Stable postoperative chest status post right upper lobe resection. No acute findings or evidence of metastatic disease. Electronically Signed   By: Richardean Sale M.D.   On: 05/07/2021 15:37   CT Head Wo Contrast  Result Date: 04/22/2021 CLINICAL DATA:  Initial evaluation for acute mental status change, history of metastatic lung carcinoma with brain involvement. Status post chemotherapy. EXAM: CT HEAD WITHOUT CONTRAST TECHNIQUE: Contiguous axial images were  obtained from the base of the skull through the vertex without intravenous contrast. COMPARISON:  Previous CT from 02/13/2021 and brain MRI from 02/18/2021. FINDINGS: Brain: Postoperative changes from previous left frontal craniotomy are seen. Vasogenic edema involving the anterior left frontal lobe  about the surgical resection site has progressed and worsened from prior exam, concerning for progressive disease. Additionally, there is progressive vasogenic edema at the left parieto-occipital region, presumably related to a previously identified subcentimeter metastatic implant at this location, also concerning for progressive disease. No other significant vasogenic edema. No significant midline shift. No hydrocephalus or ventricular trapping. Basilar cisterns remain patent. No definite new lesions evident by CT. No acute intracranial hemorrhage. No other visible acute large vessel territory infarct. No extra-axial fluid collection. Vascular: No hyperdense vessel. Skull: Prior left frontal craniotomy. Calvarium otherwise intact. No scalp soft tissue abnormality. Sinuses/Orbits: Globes and orbital soft tissues demonstrate no acute finding. Left sphenoid sinus retention cyst noted. Paranasal sinuses and mastoid air cells are otherwise clear. Other: None. IMPRESSION: 1. Postoperative changes from previous left frontal craniotomy. Progressive vasogenic edema involving the anterior left frontal lobe about the surgical resection site, concerning for progressive disease. If the patient has undergone recent XRT, possible post radiation changes could also be considered. 2. Progressive vasogenic edema at the left parieto-occipital region, presumably related to a previously identified subcentimeter metastatic implant at this location, also concerning for progressive disease versus post XRT changes. 3. No other new acute intracranial abnormality. Electronically Signed   By: Jeannine Boga M.D.   On: 04/22/2021 21:02   MR  Brain W and Wo Contrast  Result Date: 04/23/2021 CLINICAL DATA:  Mental status change with unknown cause. History of brain metastases. EXAM: MRI HEAD WITHOUT AND WITH CONTRAST TECHNIQUE: Multiplanar, multiecho pulse sequences of the brain and surrounding structures were obtained without and with intravenous contrast. CONTRAST:  4.62m GADAVIST GADOBUTROL 1 MMOL/ML IV SOLN COMPARISON:  Head CT from yesterday.  Brain MRI 02/18/2021 FINDINGS: Brain: Hazy, ill-defined masslike enhancement in the high left frontal lobe subjacent to the craniotomy, progressed to 19 mm in area on coronal postcontrast imaging. Significant interval increase in brain edema. More mildly increased size of enhancing 7 mm metastasis in the left parietal lobe, also with attendant increase in edema. No infarct, hemorrhage, hydrocephalus, or collection. Vascular: Normal flow voids and vascular enhancement Skull and upper cervical spine: Normal marrow signal. Cervical facet spurring. Sinuses/Orbits: Negative IMPRESSION: Increased enhancement and vasogenic edema at the treated left frontal and left parietal metastases. Electronically Signed   By: JJorje GuildM.D.   On: 04/23/2021 04:45   DG Chest Portable 1 View  Result Date: 04/22/2021 CLINICAL DATA:  Evaluate for infection. EXAM: PORTABLE CHEST 1 VIEW COMPARISON:  Chest radiograph dated 02/12/2021 and CT dated 03/14/2021. FINDINGS: Postsurgical changes of the right lung with persistent tenting of the right hemidiaphragm and small right pleural effusion similar to prior exams. The left lung is clear. No pneumothorax. Stable cardiac silhouette. No acute osseous pathology. IMPRESSION: Stable postsurgical changes of the right lung with small right pleural effusion. No new consolidation. Electronically Signed   By: AAnner CreteM.D.   On: 04/22/2021 20:44   EEG adult  Result Date: 04/23/2021 YLora Havens MD     04/23/2021 11:33 AM Patient Name: Hannah NOGUCHIMRN: [id number] Epilepsy Attending: PLora HavensReferring Physician/Provider: Dr CIleene MusaDate: 04/23/2021 Duration: 23.58 mins Patient history: 70year old female with new onset seizures in the setting of worsening edema related to metastatic lesions.  EEG to evaluate for seizure. Level of alertness: Awake AEDs during EEG study: LEV Technical aspects: This EEG study was done with scalp electrodes positioned according to the 10-20 International system of electrode placement. Electrical activity was acquired at a sampling  rate of 500Hz and reviewed with a high frequency filter of 70Hz and a low frequency filter of 1Hz. EEG data were recorded continuously and digitally stored. Description: The posterior dominant rhythm consists of 9 Hz activity of moderate voltage (25-35 uV) seen predominantly in posterior head regions, symmetric and reactive to eye opening and eye closing. EEG showed lateralized periodic discharges in right hemisphere, maximal right frontal region every 2 to 3 seconds as well as intermittent 2 to 3 Hz delta slowing in right hemisphere, maximal right frontal region. Photic driving was not seen during photic stimulation.  Hyperventilation was not performed.   ABNORMALITY - Lateralized periodic discharges, right hemisphere, maximal right frontal region. -Intermittent slow,right hemisphere, maximal right frontal region. IMPRESSION: This study showed evidence of epileptogenicity as well as cortical dysfunction in right hemisphere, maximal right frontal region likely due to underlying structural abnormality/metastatic disease/edema.  No seizures were seen throughout the recording. Priyanka Barbra Sarks   Overnight EEG with video  Result Date: 04/27/2021 Lora Havens, MD     04/28/2021  8:40 AM Patient Name: MALESHA SULIMAN MRN: [id number] Epilepsy Attending: Lora Havens Referring Physician/Provider: Dr Donnetta Simpers Duration: 04/26/2021 8295 to 04/27/2021 0934  Patient history: 70 year old female with  new onset seizures in the setting of worsening edema related to metastatic lesions.  EEG to evaluate for seizure.  Level of alertness: Awake, asleep  AEDs during EEG study: LEV  Technical aspects: This EEG study was done with scalp electrodes positioned according to the 10-20 International system of electrode placement. Electrical activity was acquired at a sampling rate of 500Hz and reviewed with a high frequency filter of 70Hz and a low frequency filter of 1Hz. EEG data were recorded continuously and digitally stored.  Description: The posterior dominant rhythm consists of 9 Hz activity of moderate voltage (25-35 uV) seen predominantly in posterior head regions, symmetric and reactive to eye opening and eye closing. Sleep was characterized by vertex waves, sleep spindles (12-14Hz), maximal frontocentral region. EEG showed intermittent 3-5hz theta-delta slowing admixed with sharp waves in right fronto-central region consistent with breach artifact. One seizure without clinical signs was noted on 04/26/2021 at 1445. During the seizure, eeg showed 13hz beta activity in left fronto-central region which evolved into 7-8hzhz alpha activity admixed with sharp waves and eventually 2-3hz delta slowing. Duration of seizure was about 2 minutes.  ABNORMALITY - Focal seizure without clinical signs, left fronto-central region - Breach artifact, left fronto-central region - Spike, left fronto-central region IMPRESSION: This study showed one seizure without clinical signs arising from left fronto-central region on 04/26/2021 at 144, lasting about 2 minutes. There is also evidence of epileptogenicity and cortical dysfunction left frontocentral region consistent with history if craniotomy.   [place]   DG HIP UNILAT WITH PELVIS 2-3 VIEWS LEFT  Result Date: 04/28/2021 CLINICAL DATA:  Weakness, fall EXAM: DG HIP (WITH OR WITHOUT PELVIS) 2-3V LEFT COMPARISON:  None. FINDINGS: Normal alignment. No fracture or dislocation.  Hip joint spaces are preserved vascular calcifications are seen within the pelvis and medial thighs. Lumbar spinous process fixation plates are visualized. IMPRESSION: Negative. Electronically Signed   By: Fidela Salisbury M.D.   On: 04/28/2021 02:57     ASSESSMENT AND PLAN: This is a very pleasant 70 years old African-American female diagnosed with a stage IV (T3, N0, M1b) non-small cell lung cancer, adenocarcinoma presented with right upper lobe lung mass with solitary brain metastasis diagnosed in February 2022 status post SRS to the brain lesion  followed by craniotomy and resection. The patient also has a solitary lung mass. S/p robotic assisted right upper lobectomy with en bloc wedge resection of the right middle lobe and lymph node dissection under the care of Dr. Roxan Hockey on September 24, 2020. She also underwent SRS treatment to a new subcentimeter brain lesions under the care of Dr. Lisbeth Renshaw She is currently undergoing systemic chemotherapy with carboplatin for AUC of 5, Alimta 500 Mg/M2 and Keytruda 200 Mg IV every 3 weeks status post 8 cycles.  Starting from cycle #5 the patient will be on maintenance treatment with Alimta and Keytruda every 3 weeks. The patient continues to tolerate this treatment fairly well with no concerning complaints but recently she was found to have significant vasogenic edema around the previously treated tumor with some concern about disease progression. She was seen by Dr. Mickeal Skinner and started treatment with Decadron 4 mg p.o. every morning and 2 mg p.o. every afternoon for the vasogenic edema. The patient continues to tolerate her treatment well.  The start of cycle #9 was delayed by 3 weeks because of her treatment with Decadron.  Her Decadron is down to 2 mg p.o. daily and the patient is feeling little bit better. I recommended for her to resume her treatment and she will start cycle #9 today. I will see her back for follow-up visit in 3 weeks for evaluation before the  next cycle of her treatment.  Have repeat CT scan of the chest, abdomen and pelvis before the next visit of her treatment. She was advised to call immediately if she has any concerning symptoms in the interval. The patient voices understanding of current disease status and treatment options and is in agreement with the current care plan.  All questions were answered. The patient knows to call the clinic with any problems, questions or concerns. We can certainly see the patient much sooner if necessary.  Disclaimer: This note was dictated with voice recognition software. Similar sounding words can inadvertently be transcribed and may not be corrected upon review.

## 2021-05-22 NOTE — Patient Instructions (Signed)
[facility] MEDICAL ONCOLOGY  Discharge Instructions: Thank you for choosing [facility] to provide your oncology and hematology care.   If you have a lab appointment with the Cancer Center, please go directly to the Cancer Center and check in at the registration area.   Wear comfortable clothing and clothing appropriate for easy access to any Portacath or PICC line.   We strive to give you quality time with your provider. You may need to reschedule your appointment if you arrive late (15 or more minutes).  Arriving late affects you and other patients whose appointments are after yours.  Also, if you miss three or more appointments without notifying the office, you may be dismissed from the clinic at the provider's discretion.      For prescription refill requests, have your pharmacy contact our office and allow 72 hours for refills to be completed.    Today you received the following chemotherapy and/or immunotherapy agents: Keytruda/Alimta.      To help prevent nausea and vomiting after your treatment, we encourage you to take your nausea medication as directed.  BELOW ARE SYMPTOMS THAT SHOULD BE REPORTED IMMEDIATELY: *FEVER GREATER THAN 100.4 F (38 C) OR HIGHER *CHILLS OR SWEATING *NAUSEA AND VOMITING THAT IS NOT CONTROLLED WITH YOUR NAUSEA MEDICATION *UNUSUAL SHORTNESS OF BREATH *UNUSUAL BRUISING OR BLEEDING *URINARY PROBLEMS (pain or burning when urinating, or frequent urination) *BOWEL PROBLEMS (unusual diarrhea, constipation, pain near the anus) TENDERNESS IN MOUTH AND THROAT WITH OR WITHOUT PRESENCE OF ULCERS (sore throat, sores in mouth, or a toothache) UNUSUAL RASH, SWELLING OR PAIN  UNUSUAL VAGINAL DISCHARGE OR ITCHING   Items with * indicate a potential emergency and should be followed up as soon as possible or go to the Emergency Department if any problems should occur.  Please show the CHEMOTHERAPY ALERT CARD or IMMUNOTHERAPY ALERT CARD at  check-in to the Emergency Department and triage nurse.  Should you have questions after your visit or need to cancel or reschedule your appointment, please contact [facility]COL[facility]  Dept: [phone]  and follow the prompts.  Office hours are 8:00 a.m. to 4:30 p.m. Monday - Friday. Please note that voicemails left after 4:00 p.m. may not be returned until the following business day.  We are closed weekends and major holidays. You have access to a nurse at all times for urgent questions. Please call the main number to the clinic Dept: [phone] and follow the prompts.   For any non-urgent questions, you may also contact your provider using MyChart. We now offer e-Visits for anyone 18 and older to request care online for non-urgent symptoms. For details visit [email or web address].   Also download the MyChart app! Go to the app store, search "MyChart", open the app, select [facility], and log in with your MyChart username and password.  Due to Covid, a mask is required upon entering the hospital/clinic. If you do not have a mask, one will be given to you upon arrival. For doctor visits, patients may have 1 support person aged 18 or older with them. For treatment visits, patients cannot have anyone with them due to current Covid guidelines and our immunocompromised population.   

## 2021-05-24 ENCOUNTER — Telehealth: Payer: Self-pay | Admitting: *Deleted

## 2021-05-24 ENCOUNTER — Other Ambulatory Visit: Payer: Self-pay | Admitting: Hematology and Oncology

## 2021-05-24 MED ORDER — LACOSAMIDE 100 MG PO TABS: 100.0000 mg | ORAL_TABLET | Freq: Two times a day (BID) | ORAL | 0 refills | Status: DC

## 2021-05-24 MED ORDER — LEVETIRACETAM 750 MG PO TABS: 1500.0000 mg | ORAL_TABLET | Freq: Two times a day (BID) | ORAL | 0 refills | Status: DC

## 2021-05-24 NOTE — Telephone Encounter (Signed)
Daughter called for a refill of Keppra (will run out before Sunday evening dose) and Vimpat (lacosamine)

## 2021-05-27 ENCOUNTER — Other Ambulatory Visit: Payer: Self-pay | Admitting: Internal Medicine

## 2021-05-27 ENCOUNTER — Telehealth: Payer: Self-pay | Admitting: *Deleted

## 2021-05-27 MED ORDER — LEVETIRACETAM 100 MG/ML PO SOLN: 1500.0000 mg | Freq: Two times a day (BID) | ORAL | 6 refills | Status: DC

## 2021-05-27 NOTE — Telephone Encounter (Signed)
Daughter called requesting a refill change.  The last Rx for Keppra 1500mg  BID was in tablet form and the patient is having a harder time swallowing those tablets and wanted to know if they could get a refill in liquid form.  Routing to MD

## 2021-05-28 ENCOUNTER — Telehealth: Payer: Self-pay | Admitting: Medical Oncology

## 2021-05-28 ENCOUNTER — Ambulatory Visit (INDEPENDENT_AMBULATORY_CARE_PROVIDER_SITE_OTHER): Payer: Medicare HMO | Admitting: Emergency Medicine

## 2021-05-28 ENCOUNTER — Emergency Department (HOSPITAL_COMMUNITY)
Admission: EM | Admit: 2021-05-28 | Discharge: 2021-05-28 | Disposition: A | Discharge status: Home / Self Care | Payer: Medicare HMO | Attending: Emergency Medicine | Admitting: Emergency Medicine

## 2021-05-28 ENCOUNTER — Encounter: Payer: Self-pay | Admitting: Emergency Medicine

## 2021-05-28 ENCOUNTER — Encounter: Payer: Self-pay | Admitting: *Deleted

## 2021-05-28 ENCOUNTER — Encounter: Payer: Self-pay | Admitting: Internal Medicine

## 2021-05-28 ENCOUNTER — Telehealth: Payer: Self-pay | Admitting: *Deleted

## 2021-05-28 ENCOUNTER — Other Ambulatory Visit: Payer: Self-pay

## 2021-05-28 ENCOUNTER — Encounter (HOSPITAL_COMMUNITY): Payer: Self-pay | Admitting: Emergency Medicine

## 2021-05-28 ENCOUNTER — Telehealth: Payer: Self-pay | Admitting: Internal Medicine

## 2021-05-28 VITALS — BP 122/62 | HR 95 | Ht 61.0 in | Wt 113.0 lb

## 2021-05-28 DIAGNOSIS — Z5111 Encounter for antineoplastic chemotherapy: Secondary | ICD-10-CM | POA: Diagnosis not present

## 2021-05-28 DIAGNOSIS — Z5321 Procedure and treatment not carried out due to patient leaving prior to being seen by health care provider: Secondary | ICD-10-CM | POA: Insufficient documentation

## 2021-05-28 DIAGNOSIS — C7931 Secondary malignant neoplasm of brain: Secondary | ICD-10-CM | POA: Diagnosis not present

## 2021-05-28 DIAGNOSIS — G936 Cerebral edema: Secondary | ICD-10-CM | POA: Diagnosis not present

## 2021-05-28 DIAGNOSIS — C3411 Malignant neoplasm of upper lobe, right bronchus or lung: Secondary | ICD-10-CM | POA: Diagnosis not present

## 2021-05-28 DIAGNOSIS — R55 Syncope and collapse: Secondary | ICD-10-CM | POA: Diagnosis not present

## 2021-05-28 DIAGNOSIS — R531 Weakness: Secondary | ICD-10-CM | POA: Diagnosis not present

## 2021-05-28 DIAGNOSIS — W19XXXA Unspecified fall, initial encounter: Secondary | ICD-10-CM | POA: Diagnosis not present

## 2021-05-28 DIAGNOSIS — W01198A Fall on same level from slipping, tripping and stumbling with subsequent striking against other object, initial encounter: Secondary | ICD-10-CM | POA: Insufficient documentation

## 2021-05-28 DIAGNOSIS — R5383 Other fatigue: Secondary | ICD-10-CM | POA: Diagnosis not present

## 2021-05-28 DIAGNOSIS — K219 Gastro-esophageal reflux disease without esophagitis: Secondary | ICD-10-CM | POA: Diagnosis not present

## 2021-05-28 DIAGNOSIS — R569 Unspecified convulsions: Secondary | ICD-10-CM | POA: Diagnosis not present

## 2021-05-28 DIAGNOSIS — Z5112 Encounter for antineoplastic immunotherapy: Secondary | ICD-10-CM | POA: Diagnosis not present

## 2021-05-28 LAB — CBG MONITORING, ED: Glucose-Capillary: 146 mg/dL — ABNORMAL HIGH (ref 70–99)

## 2021-05-28 LAB — BASIC METABOLIC PANEL
Anion gap: 9 (ref 5–15)
BUN: 14 mg/dL (ref 8–23)
CO2: 26 mmol/L (ref 22–32)
Calcium: 9.6 mg/dL (ref 8.9–10.3)
Chloride: 99 mmol/L (ref 98–111)
Creatinine, Ser: 0.7 mg/dL (ref 0.44–1.00)
GFR, Estimated: >60 mL/min (ref >60)
Glucose, Bld: 152 mg/dL — ABNORMAL HIGH (ref 70–99)
Potassium: 4 mmol/L (ref 3.5–5.1)
Sodium: 134 mmol/L — ABNORMAL LOW (ref 135–145)

## 2021-05-28 LAB — CBC
HCT: 37.9 % (ref 36.0–46.0)
Hemoglobin: 11.5 g/dL — ABNORMAL LOW (ref 12.0–15.0)
MCH: 27.6 pg (ref 26.0–34.0)
MCHC: 30.3 g/dL (ref 30.0–36.0)
MCV: 90.9 fL (ref 80.0–100.0)
Platelets: 241 10*3/uL (ref 150–400)
RBC: 4.17 MIL/uL (ref 3.87–5.11)
RDW: 13.8 % (ref 11.5–15.5)
WBC: 3.1 10*3/uL — ABNORMAL LOW (ref 4.0–10.5)
nRBC: 0 % (ref 0.0–0.2)

## 2021-05-28 NOTE — Telephone Encounter (Signed)
Pt. Daughter has called and is requesting a head scan of pt. States pt. Fell yesterday. Currently waiting in hospital. Concerned because she doesn't want pt. To get any sicker than she is now. Scheduled an office visit with another provider today, and caller wanted to make PCP aware.   Please advise

## 2021-05-28 NOTE — ED Notes (Signed)
Pts daughter states that she is taking the pt home. States that pt needs home meds and is unable to wait any longer. Advised to return if symptoms worsen.

## 2021-05-28 NOTE — Patient Instructions (Signed)

## 2021-05-28 NOTE — Telephone Encounter (Signed)
Daughter called this morning.  States they are in the Emergency Room with patient and expected wait time is lengthy >11 hrs.   She wanted to know if patient could be evaluated outpatient.   Timeline of events below:  11/1 Dr Mickeal Skinner tapered down to Decadron 4mg /2mg   11/15 Dr Mickeal Skinner tapered down to Decadron 4 mg daily  11/22 Dr Julien Nordmann tapered down Decadron to 2 mg daily.  11/27 daughter began to notice some slowness in patients speech and forgetfulness, insomnia  11/28 Patient failed to take Decadron that morning as instructed and  took later in the day.  Speech worsening and patient fell hit face/nose.  Patient alert and oriented.    11/29 took patient to Emergency room for fall and change in speech.   Patient hasn't taken Decadron, Vimpat and Keppra today as she is scheduled.  Daughter is concerned for the delay in taking those medications.  Is unsure if change is from steroid taper or something else.  Patient is scheduled to have MRI brain 05/31/21.  Should they remain in the ER or can this be addressed outpatient to avoid lengthy wait time and potential exposure to other patients who are waiting in the ER.

## 2021-05-28 NOTE — ED Triage Notes (Addendum)
Pt to triage via [facility] from home.  Syncopal episode 9 hours ago fell and hit her face.  No blood thinners.  C/o nose and facial pain. Abrasion to nose.  Mild dizziness after fall.

## 2021-05-28 NOTE — Progress Notes (Signed)
Hannah [name] 70 y.o.   Chief Complaint  Patient presents with   Fall    Last night, 1030. Pt states back, face, and head hurts. Pt laid on floor.    HISTORY OF PRESENT ILLNESS: This is a 70 y.o. female here with daughter stating patient fell last night and she found her on the floor. Patient states she remembers falling down, not sure why, but does not think she lost consciousness. Remembers details of being on the floor.  Unclear if there was a postictal period. They went to emergency room this morning and stayed for about 3 hours.  Patient was triaged and had blood work done but he elected to leave after a long wait. At present patient feels fine, tired because she did not sleep.  No significant head injuries.  Has no pain at present time. No injuries except for a scratch to her nose.  No other significant associated symptoms. No seizure activity but patient has been on antiseizure medication due to metastatic lung cancer with brain metastases. Patient is scheduled for brain MRI next Friday. No other complaints or medical concerns today.  HPI   Prior to Admission medications   Medication Sig Start Date End Date Taking? Authorizing Provider  acetaminophen (TYLENOL) 500 MG tablet Take 2 tablets (1,000 mg total) by mouth every 6 (six) hours. 09/28/20  Yes Conte, Tessa N, PA-C  ALPRAZolam (XANAX XR) 1 MG 24 hr tablet 1  qhs Patient taking differently: Take 1 mg by mouth at bedtime. 03/26/21  Yes Plovsky, Berneta Sages, MD  ALPRAZolam Duanne Moron) 0.5 MG tablet 2  qam   2  @2 ;00 Patient taking differently: Take 0.5-1 mg by mouth See admin instructions. 0.5mg  oral twice daily And 0.5mg  oral extra twice daily as needed for anxiety 03/26/21  Yes Plovsky, Berneta Sages, MD  Ascorbic Acid (VITAMIN C) 1000 MG tablet Take 1,000 mg by mouth daily.   Yes [provider]  aspirin 81 MG tablet Take 1 tablet (81 mg total) by mouth daily. Restart on 08/31/20 08/25/20  Yes Judith Part, MD  bisacodyl  (DULCOLAX) 5 MG EC tablet Take 5 mg by mouth daily as needed for moderate constipation.   Yes [provider]  carboxymethylcellulose (REFRESH PLUS) 0.5 % SOLN Place 1-2 drops into both eyes 3 (three) times daily as needed (dry eyes).   Yes [provider]  Cholecalciferol (D3 PO) Take 1 capsule by mouth daily.   Yes [provider]  citalopram (CELEXA) 10 MG tablet Take 1 tablet (10 mg total) by mouth daily. 05/22/21 05/22/22 Yes Plovsky, Berneta Sages, MD  dexamethasone (DECADRON) 4 MG tablet Take 1 tablet (4 mg total) by mouth 2 (two) times daily. 04/28/21  Yes Domenic Polite, MD  folic acid (FOLVITE) 1 MG tablet Take 1 tablet by mouth once daily 05/06/21  Yes Curt Bears, MD  Lacosamide 100 MG TABS Take 1 tablet (100 mg total) by mouth 2 (two) times daily. 05/24/21  Yes Orson Slick, MD  levETIRAcetam (KEPPRA) 100 MG/ML solution Take 15 mLs (1,500 mg total) by mouth 2 (two) times daily. 05/27/21  Yes Vaslow, Acey Lav, MD  loratadine (CLARITIN) 10 MG tablet Take 10 mg by mouth daily as needed for allergies.   Yes [provider]  Menthol, Topical Analgesic, (BIOFREEZE EX) Apply 1 application topically as needed (pain).   Yes [provider]  mirtazapine (REMERON) 45 MG tablet Take 1 tablet (45 mg total) by mouth at bedtime. 03/26/21  Yes Plovsky, Berneta Sages,  MD  olopatadine (PATANOL) 0.1 % ophthalmic solution Place 1 drop into both eyes 2 (two) times daily as needed for allergies.   Yes [provider]  omeprazole (PRILOSEC) 40 MG capsule TAKE 1 CAPSULE (40 MG TOTAL) BY MOUTH 2 (TWO) TIMES DAILY BEFORE A MEAL. Patient taking differently: Take 40 mg by mouth in the morning and at bedtime. 01/31/21  Yes Burns, Claudina Lick, MD  polyethylene glycol (MIRALAX / GLYCOLAX) 17 g packet Take 17 g by mouth 2 (two) times daily. 08/19/20  Yes Eugenie Filler, MD  pravastatin (PRAVACHOL) 40 MG tablet TAKE 1 TABLET (40 MG TOTAL) BY MOUTH EVERY EVENING. 01/31/21  Yes  Burns, Claudina Lick, MD  Probiotic Product (PROBIOTIC DAILY PO) Take 1 capsule by mouth daily.   Yes [provider]  prochlorperazine (COMPAZINE) 10 MG tablet Take 1 tablet (10 mg total) by mouth every 6 (six) hours as needed for nausea or vomiting. 10/23/20  Yes Curt Bears, MD  simethicone (MYLICON) 80 MG chewable tablet Chew 1 tablet (80 mg total) by mouth 4 (four) times daily as needed for flatulence (Bloating). 09/28/20  Yes Elgie Collard, PA-C    Allergies  Allergen Reactions   Amlodipine Swelling and Rash    Rash, swelling   Chantix [Varenicline Tartrate] Shortness Of Breath, Swelling and Other (See Comments)    Tongue swell,sob   Clarithromycin Rash   Lisinopril Hives   Simvastatin Hives   Wellbutrin [Bupropion Hcl] Hives   Lipitor [Atorvastatin]     Dizziness per patient   Sertraline     Makes her feel like she is going to kill someone    Patient Active Problem List   Diagnosis Date Noted   Acute encephalopathy 04/22/2021   Vasogenic brain edema ([place]) 04/22/2021   Seizure ([place]) 04/22/2021   Nonrheumatic aortic valve insufficiency 04/18/2021   Sinusitis 02/21/2021   Allergic rhinitis 02/21/2021   Encounter for antineoplastic chemotherapy 10/23/2020   Encounter for antineoplastic immunotherapy 10/23/2020   Anemia 10/19/2020   Sleep difficulties 10/19/2020   S/P robot-assisted surgical procedure 09/24/2020   Aortic atherosclerosis ([place]) 09/12/2020   Lung mass 09/04/2020   Status post craniotomy 08/24/2020   Brain tumor ([place]) 08/24/2020   Hematochezia 08/19/2020   Acute blood loss anemia    Rectal bleeding 08/17/2020   Primary cancer of right upper lobe of lung ([place]) 08/07/2020   Brain metastasis ([place]) 08/06/2020   Memory changes 07/24/2020   Non-recurrent unilateral inguinal hernia without obstruction or gangrene 07/24/2020   Pain of right clavicle 09/12/2019   Nonspecific abnormal electrocardiogram (ECG) (EKG) 10/20/2018   Educated about COVID-19 virus  infection 10/20/2018   Depression- Dr Casimiro Needle 07/17/2018   Primary osteoarthritis of right knee 06/04/2018   Bilateral carotid artery stenosis 09/24/2017   Aortic valve sclerosis 09/24/2017   Vocal cord polyps 09/07/2017   Dysphagia 09/07/2017   Diabetes mellitus without complication ([place]) 47/42/5956   GERD (gastroesophageal reflux disease) 02/27/2016   Anxiety - Dr Casimiro Needle 02/27/2016   S/P total knee replacement 02/13/2014   RBBB 02/08/2014   HTN (hypertension) 02/08/2014   PVD - bilateral 60-79% carotid strenosis 02/08/2014   Mixed hyperlipidemia 11/29/2013   Carpal tunnel syndrome, bilateral 11/23/2013   Murmur- mild -mod AR, mild MR 11/10/2013   Constipation 12/16/2012    Past Medical History:  Diagnosis Date   Allergy    Anemia    Anxiety    Arthritis    Carpal tunnel syndrome    Constipation    senna C  stool softeners help    Depression    Diverticulosis    Dyslipidemia    External hemorrhoids    GERD (gastroesophageal reflux disease)    Heart murmur    mild-moderate AR   Hiatal hernia    Hyperlipidemia    on meds    Hypertension    Internal hemorrhoids    lung ca with brain mets 07/2020   Osteoarthritis    Pre-diabetes    PVD (peripheral vascular disease) ([facility])    moderate carotid disease   RBBB    Smoker    Vocal cord polyps     Past Surgical History:  Procedure Laterality Date   ABDOMINAL HYSTERECTOMY  2482   APPLICATION OF CRANIAL NAVIGATION N/A 08/24/2020   Procedure: APPLICATION OF CRANIAL NAVIGATION;  Surgeon: Judith Part, MD;  Location: [facility];  Service: Neurosurgery;  Laterality: N/A;   COLONOSCOPY     COLONOSCOPY W/ POLYPECTOMY  2009   CRANIOTOMY Left 08/24/2020   Procedure: Left Craniotomy for Tumor Resection with Brainlab;  Surgeon: Judith Part, MD;  Location: [facility];  Service: Neurosurgery;  Laterality: Left;   HEMORRHOID SURGERY     INTERCOSTAL NERVE BLOCK Right 09/24/2020   Procedure: INTERCOSTAL NERVE BLOCK;  Surgeon:  Melrose Nakayama, MD;  Location: [facility];  Service: Thoracic;  Laterality: Right;   JOINT REPLACEMENT     LYMPH NODE DISSECTION Right 09/24/2020   Procedure: LYMPH NODE DISSECTION;  Surgeon: Melrose Nakayama, MD;  Location: [facility];  Service: Thoracic;  Laterality: Right;   POLYPECTOMY     right total knee arthroplasty     Dr. Emeterio Reeve 06-04-18   [place]  08/16/2009   TOTAL KNEE ARTHROPLASTY Left 02/13/2014   Procedure: TOTAL KNEE ARTHROPLASTY;  Surgeon: Alta Corning, MD;  Location: [facility];  Service: Orthopedics;  Laterality: Left;   TOTAL KNEE ARTHROPLASTY Right 06/04/2018   Procedure: RIGHT TOTAL KNEE ARTHROPLASTY;  Surgeon: Dorna Leitz, MD;  Location: [facility];  Service: Orthopedics;  Laterality: Right;  Adductor Block   TUBAL LIGATION      Social History   Socioeconomic History   Marital status: Married    Spouse name: Not on file   Number of children: 2   Years of education: Not on file   Highest education level: Not on file  Occupational History   Not on file  Tobacco Use   Smoking status: Former    Packs/day: 0.25    Years: 40.00    Pack years: 10.00    Types: Cigarettes    Quit date: 07/09/2020    Years since quitting: 0.8    Passive exposure: Never   Smokeless tobacco: Never   Tobacco comments:    PACK WILL LAST 3 days  Vaping Use   Vaping Use: Never used  Substance and Sexual Activity   Alcohol use: No   Drug use: No   Sexual activity: Not Currently  Other Topics Concern   Not on file  Social History Narrative   Not on file   Social Determinants of Health   Financial Resource Strain: Low Risk    Difficulty of Paying Living Expenses: Not hard at all  Food Insecurity: No Food Insecurity   Worried About Charity fundraiser in the Last Year: Never true   [place] in the Last Year: Never true  Transportation Needs: No Transportation Needs   Lack of Transportation (Medical): No   Lack of Transportation (Non-Medical): No  Physical Activity:  Sufficiently Active   Days of Exercise per Week: 3 days   Minutes of Exercise per Session: 60 min  Stress: No Stress Concern Present   Feeling of Stress : Not at all  Social Connections: Socially Integrated   Frequency of Communication with Friends and Family: More than three times a week   Frequency of Social Gatherings with Friends and Family: More than three times a week   Attends Religious Services: More than 4 times per year   Active Member of [employer] or Organizations: Yes   Attends Music therapist: More than 4 times per year   Marital Status: Married  Human resources officer Violence: Not At Risk   Fear of Current or Ex-Partner: No   Emotionally Abused: No   Physically Abused: No   Sexually Abused: No    Family History  Problem Relation Age of Onset   Cancer Father    Prostate cancer Father    Diabetes Sister    Cancer Sister    Stroke Maternal Grandfather    Colitis Maternal Aunt    Breast cancer Maternal Aunt    Colon cancer Neg Hx    Colon polyps Neg Hx    Rectal cancer Neg Hx    Stomach cancer Neg Hx    Esophageal cancer Neg Hx      Review of Systems  Constitutional: Negative.  Negative for chills and fever.  HENT: Negative.  Negative for congestion and sore throat.   Respiratory: Negative.  Negative for cough and shortness of breath.   Cardiovascular: Negative.  Negative for chest pain and palpitations.  Gastrointestinal:  Negative for abdominal pain, diarrhea, nausea and vomiting.  Skin: Negative.  Negative for rash.  Neurological:  Negative for dizziness and headaches.  All other systems reviewed and are negative.  Today's Vitals   05/28/21 1327  Weight: 113 lb (51.3 kg)  Height: 5\' 1"  (1.549 m)   Body mass index is 21.35 kg/m.  Physical Exam Vitals reviewed.  Constitutional:      Appearance: Normal appearance.  HENT:     Head: Normocephalic and atraumatic.     Nose:     Comments: Positive abrasion to bridge of nose Eyes:      Extraocular Movements: Extraocular movements intact.     Conjunctiva/sclera: Conjunctivae normal.     Pupils: Pupils are equal, round, and reactive to light.  Cardiovascular:     Rate and Rhythm: Normal rate and regular rhythm.     Pulses: Normal pulses.     Heart sounds: Normal heart sounds.  Pulmonary:     Effort: Pulmonary effort is normal.     Breath sounds: Normal breath sounds.  Musculoskeletal:     Cervical back: Neck supple. No tenderness.     Right lower leg: No edema.     Left lower leg: No edema.  Lymphadenopathy:     Cervical: No cervical adenopathy.  Skin:    General: Skin is warm and dry.     Capillary Refill: Capillary refill takes less than 2 seconds.  Neurological:     General: No focal deficit present.     Mental Status: She is alert and oriented to person, place, and time.     Sensory: No sensory deficit.     Motor: No weakness.     Gait: Gait normal.  Psychiatric:        Mood and Affect: Mood normal.        Behavior: Behavior normal.     ASSESSMENT &  PLAN: Clinically stable.  Suspected syncopal episode secondary to seizure activity. No significant injuries after the fall.  No signs of head trauma. Patient has a history of metastatic lung cancer with brain metastases. Scheduled for brain MRI next Friday.  A total of 47 minutes was spent with the patient and counseling/coordination of care regarding preparing for this visit, review of most recent office visit notes, review of recent emergency department visit, review of most recent blood work results, review of all medications and past medical history, comprehensive history and physical examination, differential diagnosis of syncopal episode, ED precautions, head injury precautions, prognosis, documentation, and need for follow-up.   Problem List Items Addressed This Visit       Respiratory   Primary cancer of right upper lobe of lung ([place])     Nervous and Auditory   Brain metastasis ([place])   Other  Visit Diagnoses     Accidental fall, initial encounter    -  Primary      Patient Instructions  Fall Prevention in the Home, Adult Falls can cause injuries and can happen to people of all ages. There are many things you can do to make your home safe and to help prevent falls. Ask for help when making these changes. What actions can I take to prevent falls? General Instructions Use good lighting in all rooms. Replace any light bulbs that burn out. Turn on the lights in dark areas. Use night-lights. Keep items that you use often in easy-to-reach places. Lower the shelves around your home if needed. Set up your furniture so you have a clear path. Avoid moving your furniture around. Do not have throw rugs or other things on the floor that can make you trip. Avoid walking on wet floors. If any of your floors are uneven, fix them. Add color or contrast paint or tape to clearly mark and help you see: Grab bars or handrails. First and last steps of staircases. Where the edge of each step is. If you use a stepladder: Make sure that it is fully opened. Do not climb a closed stepladder. Make sure the sides of the stepladder are locked in place. Ask someone to hold the stepladder while you use it. Know where your pets are when moving through your home. What can I do in the bathroom?   Keep the floor dry. Clean up any water on the floor right away. Remove soap buildup in the tub or shower. Use nonskid mats or decals on the floor of the tub or shower. Attach bath mats securely with double-sided, nonslip rug tape. If you need to sit down in the shower, use a plastic, nonslip stool. Install grab bars by the toilet and in the tub and shower. Do not use towel bars as grab bars. What can I do in the bedroom? Make sure that you have a light by your bed that is easy to reach. Do not use any sheets or blankets for your bed that hang to the floor. Have a firm chair with side arms that you can use for  support when you get dressed. What can I do in the kitchen? Clean up any spills right away. If you need to reach something above you, use a step stool with a grab bar. Keep electrical cords out of the way. Do not use floor polish or wax that makes floors slippery. What can I do with my stairs? Do not leave any items on the stairs. Make sure that you have a  light switch at the top and the bottom of the stairs. Make sure that there are handrails on both sides of the stairs. Fix handrails that are broken or loose. Install nonslip stair treads on all your stairs. Avoid having throw rugs at the top or bottom of the stairs. Choose a carpet that does not hide the edge of the steps on the stairs. Check carpeting to make sure that it is firmly attached to the stairs. Fix carpet that is loose or worn. What can I do on the outside of my home? Use bright outdoor lighting. Fix the edges of walkways and driveways and fix any cracks. Remove anything that might make you trip as you walk through a door, such as a raised step or threshold. Trim any bushes or trees on paths to your home. Check to see if handrails are loose or broken and that both sides of all steps have handrails. Install guardrails along the edges of any raised decks and porches. Clear paths of anything that can make you trip, such as tools or rocks. Have leaves, snow, or ice cleared regularly. Use sand or salt on paths during winter. Clean up any spills in your garage right away. This includes grease or oil spills. What other actions can I take? Wear shoes that: Have a low heel. Do not wear high heels. Have rubber bottoms. Feel good on your feet and fit well. Are closed at the toe. Do not wear open-toe sandals. Use tools that help you move around if needed. These include: Canes. Walkers. Scooters. Crutches. Review your medicines with your doctor. Some medicines can make you feel dizzy. This can increase your chance of  falling. Ask your doctor what else you can do to help prevent falls. Where to find more information Centers for Disease Control and Prevention, [employer]: http://www.wolf.info/ National Institute on Aging: http://kim-miller.com/ Contact a doctor if: You are afraid of falling at home. You feel weak, drowsy, or dizzy at home. You fall at home. Summary There are many simple things that you can do to make your home safe and to help prevent falls. Ways to make your home safe include removing things that can make you trip and installing grab bars in the bathroom. Ask for help when making these changes in your home. This information is not intended to replace advice given to you by your health care provider. Make sure you discuss any questions you have with your health care provider. Document Revised: 01/18/2020 Document Reviewed: 01/18/2020 Elsevier Patient Education  2022 [place], MD [facility]

## 2021-05-28 NOTE — Telephone Encounter (Signed)
Pt is in [facility] ED after a fall at home. [name] is concerned that Hannah [name] did not take her "seizure medications today". Message sent to Dr Reche Dixon nurse.

## 2021-05-29 ENCOUNTER — Encounter: Payer: Self-pay | Admitting: Internal Medicine

## 2021-05-31 ENCOUNTER — Telehealth: Payer: Self-pay

## 2021-05-31 ENCOUNTER — Ambulatory Visit
Admission: RE | Admit: 2021-05-31 | Discharge: 2021-05-31 | Disposition: A | Discharge status: Home / Self Care | Payer: Medicare HMO | Source: Ambulatory Visit | Attending: Internal Medicine | Admitting: Internal Medicine

## 2021-05-31 ENCOUNTER — Other Ambulatory Visit: Payer: Self-pay

## 2021-05-31 DIAGNOSIS — C729 Malignant neoplasm of central nervous system, unspecified: Secondary | ICD-10-CM | POA: Diagnosis not present

## 2021-05-31 DIAGNOSIS — C7931 Secondary malignant neoplasm of brain: Secondary | ICD-10-CM

## 2021-05-31 DIAGNOSIS — G936 Cerebral edema: Secondary | ICD-10-CM | POA: Diagnosis not present

## 2021-05-31 MED ORDER — GADOBENATE DIMEGLUMINE 529 MG/ML IV SOLN
11.0000 mL | Freq: Once | INTRAVENOUS | Status: AC | PRN
  Administered 2021-05-31: 11 mL via INTRAVENOUS

## 2021-05-31 NOTE — Telephone Encounter (Signed)
T/C from pt's daughter stating pt is having her MRI today and wants results as soon as they are read.  Pt has a 12/6 f/up appt and wants to see is she can get her steroid dose increased before her appt. The daughter feels like the pt is not communicating as well and she did have a fall this week.

## 2021-06-03 ENCOUNTER — Inpatient Hospital Stay: Payer: Medicare HMO | Attending: Internal Medicine

## 2021-06-03 DIAGNOSIS — Z888 Allergy status to other drugs, medicaments and biological substances status: Secondary | ICD-10-CM | POA: Insufficient documentation

## 2021-06-03 DIAGNOSIS — Z7952 Long term (current) use of systemic steroids: Secondary | ICD-10-CM | POA: Insufficient documentation

## 2021-06-03 DIAGNOSIS — F32A Depression, unspecified: Secondary | ICD-10-CM | POA: Insufficient documentation

## 2021-06-03 DIAGNOSIS — E785 Hyperlipidemia, unspecified: Secondary | ICD-10-CM | POA: Insufficient documentation

## 2021-06-03 DIAGNOSIS — R531 Weakness: Secondary | ICD-10-CM | POA: Insufficient documentation

## 2021-06-03 DIAGNOSIS — F419 Anxiety disorder, unspecified: Secondary | ICD-10-CM | POA: Insufficient documentation

## 2021-06-03 DIAGNOSIS — Z8719 Personal history of other diseases of the digestive system: Secondary | ICD-10-CM | POA: Insufficient documentation

## 2021-06-03 DIAGNOSIS — M47814 Spondylosis without myelopathy or radiculopathy, thoracic region: Secondary | ICD-10-CM | POA: Insufficient documentation

## 2021-06-03 DIAGNOSIS — R609 Edema, unspecified: Secondary | ICD-10-CM | POA: Insufficient documentation

## 2021-06-03 DIAGNOSIS — I700 Atherosclerosis of aorta: Secondary | ICD-10-CM | POA: Insufficient documentation

## 2021-06-03 DIAGNOSIS — K219 Gastro-esophageal reflux disease without esophagitis: Secondary | ICD-10-CM | POA: Insufficient documentation

## 2021-06-03 DIAGNOSIS — C3411 Malignant neoplasm of upper lobe, right bronchus or lung: Secondary | ICD-10-CM | POA: Insufficient documentation

## 2021-06-03 DIAGNOSIS — Z833 Family history of diabetes mellitus: Secondary | ICD-10-CM | POA: Insufficient documentation

## 2021-06-03 DIAGNOSIS — J90 Pleural effusion, not elsewhere classified: Secondary | ICD-10-CM | POA: Insufficient documentation

## 2021-06-03 DIAGNOSIS — Z87891 Personal history of nicotine dependence: Secondary | ICD-10-CM | POA: Insufficient documentation

## 2021-06-03 DIAGNOSIS — Z79899 Other long term (current) drug therapy: Secondary | ICD-10-CM | POA: Insufficient documentation

## 2021-06-03 DIAGNOSIS — Z5111 Encounter for antineoplastic chemotherapy: Secondary | ICD-10-CM | POA: Insufficient documentation

## 2021-06-03 DIAGNOSIS — Z8042 Family history of malignant neoplasm of prostate: Secondary | ICD-10-CM | POA: Insufficient documentation

## 2021-06-03 DIAGNOSIS — Z5112 Encounter for antineoplastic immunotherapy: Secondary | ICD-10-CM | POA: Insufficient documentation

## 2021-06-03 DIAGNOSIS — Z823 Family history of stroke: Secondary | ICD-10-CM | POA: Insufficient documentation

## 2021-06-03 DIAGNOSIS — R569 Unspecified convulsions: Secondary | ICD-10-CM | POA: Insufficient documentation

## 2021-06-03 DIAGNOSIS — K8020 Calculus of gallbladder without cholecystitis without obstruction: Secondary | ICD-10-CM | POA: Insufficient documentation

## 2021-06-03 DIAGNOSIS — C7931 Secondary malignant neoplasm of brain: Secondary | ICD-10-CM | POA: Insufficient documentation

## 2021-06-03 DIAGNOSIS — I10 Essential (primary) hypertension: Secondary | ICD-10-CM | POA: Insufficient documentation

## 2021-06-03 DIAGNOSIS — Z809 Family history of malignant neoplasm, unspecified: Secondary | ICD-10-CM | POA: Insufficient documentation

## 2021-06-03 DIAGNOSIS — Z8379 Family history of other diseases of the digestive system: Secondary | ICD-10-CM | POA: Insufficient documentation

## 2021-06-03 DIAGNOSIS — M1990 Unspecified osteoarthritis, unspecified site: Secondary | ICD-10-CM | POA: Insufficient documentation

## 2021-06-03 DIAGNOSIS — Z803 Family history of malignant neoplasm of breast: Secondary | ICD-10-CM | POA: Insufficient documentation

## 2021-06-04 ENCOUNTER — Other Ambulatory Visit: Payer: Self-pay

## 2021-06-04 ENCOUNTER — Inpatient Hospital Stay: Payer: Medicare HMO | Admitting: Internal Medicine

## 2021-06-04 VITALS — BP 116/75 | HR 71 | Temp 98.3°F | Resp 16 | Ht 61.0 in | Wt 117.5 lb

## 2021-06-04 DIAGNOSIS — R609 Edema, unspecified: Secondary | ICD-10-CM | POA: Diagnosis not present

## 2021-06-04 DIAGNOSIS — M47814 Spondylosis without myelopathy or radiculopathy, thoracic region: Secondary | ICD-10-CM | POA: Diagnosis not present

## 2021-06-04 DIAGNOSIS — Z833 Family history of diabetes mellitus: Secondary | ICD-10-CM | POA: Diagnosis not present

## 2021-06-04 DIAGNOSIS — Z7952 Long term (current) use of systemic steroids: Secondary | ICD-10-CM | POA: Diagnosis not present

## 2021-06-04 DIAGNOSIS — R531 Weakness: Secondary | ICD-10-CM | POA: Diagnosis not present

## 2021-06-04 DIAGNOSIS — Z5111 Encounter for antineoplastic chemotherapy: Secondary | ICD-10-CM | POA: Diagnosis not present

## 2021-06-04 DIAGNOSIS — C7931 Secondary malignant neoplasm of brain: Secondary | ICD-10-CM | POA: Diagnosis not present

## 2021-06-04 DIAGNOSIS — F419 Anxiety disorder, unspecified: Secondary | ICD-10-CM | POA: Diagnosis not present

## 2021-06-04 DIAGNOSIS — R569 Unspecified convulsions: Secondary | ICD-10-CM | POA: Diagnosis not present

## 2021-06-04 DIAGNOSIS — J90 Pleural effusion, not elsewhere classified: Secondary | ICD-10-CM | POA: Diagnosis not present

## 2021-06-04 DIAGNOSIS — K219 Gastro-esophageal reflux disease without esophagitis: Secondary | ICD-10-CM | POA: Diagnosis not present

## 2021-06-04 DIAGNOSIS — K8020 Calculus of gallbladder without cholecystitis without obstruction: Secondary | ICD-10-CM | POA: Diagnosis not present

## 2021-06-04 DIAGNOSIS — Z87891 Personal history of nicotine dependence: Secondary | ICD-10-CM | POA: Diagnosis not present

## 2021-06-04 DIAGNOSIS — I10 Essential (primary) hypertension: Secondary | ICD-10-CM | POA: Diagnosis not present

## 2021-06-04 DIAGNOSIS — Z5112 Encounter for antineoplastic immunotherapy: Secondary | ICD-10-CM | POA: Diagnosis not present

## 2021-06-04 DIAGNOSIS — C3411 Malignant neoplasm of upper lobe, right bronchus or lung: Secondary | ICD-10-CM | POA: Diagnosis not present

## 2021-06-04 DIAGNOSIS — Z803 Family history of malignant neoplasm of breast: Secondary | ICD-10-CM | POA: Diagnosis not present

## 2021-06-04 DIAGNOSIS — M1990 Unspecified osteoarthritis, unspecified site: Secondary | ICD-10-CM | POA: Diagnosis not present

## 2021-06-04 DIAGNOSIS — Z8719 Personal history of other diseases of the digestive system: Secondary | ICD-10-CM | POA: Diagnosis not present

## 2021-06-04 DIAGNOSIS — E785 Hyperlipidemia, unspecified: Secondary | ICD-10-CM | POA: Diagnosis not present

## 2021-06-04 DIAGNOSIS — Z888 Allergy status to other drugs, medicaments and biological substances status: Secondary | ICD-10-CM | POA: Diagnosis not present

## 2021-06-04 DIAGNOSIS — Z79899 Other long term (current) drug therapy: Secondary | ICD-10-CM | POA: Diagnosis not present

## 2021-06-04 DIAGNOSIS — I700 Atherosclerosis of aorta: Secondary | ICD-10-CM | POA: Diagnosis not present

## 2021-06-04 DIAGNOSIS — F32A Depression, unspecified: Secondary | ICD-10-CM | POA: Diagnosis not present

## 2021-06-04 NOTE — Progress Notes (Signed)
[facility] at [facility], [place] [address] [phone]   Interval Evaluation  Date of Service: 06/04/21 Patient Name: Hannah [name] Patient MRN: [id number] Patient DOB: [birth year] Provider: Ventura Sellers, MD  Identifying Statement:  Hannah [name] is a 70 y.o. female with brain metastasis   Primary Cancer:  Oncology History  Primary cancer of right upper lobe of lung ([facility])  08/07/2020 Initial Diagnosis   Primary cancer of right upper lobe of lung ([facility])   09/20/2020 Cancer Staging   Staging form: Lung, AJCC 8th Edition - Clinical: Stage IVB (cT3, cN0, cM1c) - Signed by Curt Bears, MD on 09/20/2020    11/08/2020 -  Chemotherapy   Patient is on Treatment Plan : LUNG CARBOplatin / Pemetrexed / Pembrolizumab q21d Induction x 4 cycles / Maintenance Pemetrexed + Pembrolizumab      Oncologic History 08/23/20: Pre-op SRS to L frontal mass Tammi Klippel) 08/24/20: Craniotomy, resection ([facility]) 11/29/20: Salvage SRS x2 Tammi Klippel)  Interval History:  Hannah [name] presents today for follow up after recent MRI brain.  She and her daughter report improvement in fluidity of speech, energy, memory since the ED visit last week.  Prior to this, she had complained of right sided weakness, gait difficulty and confusion for several days.  She continues on keppra and vimpat without frank seizures.  Decadron is currently at 2mg  daily. Recently resumed chemotherapy with Dr. Julien Nordmann.  H+P (08/02/20) Patient presented to medical attention this past week with several days rapidly progressive speech impairment and right sided weakness.  She and her family describe difficulty putting complete sentences together, impaired use of right arm (for using fork, zipper, brushing teeth), and dragging of the right leg while walking.  She never needed assistance to walk.  CNS imaging demonstrated left frontal mass, and decadron was started over the weekend 4mg   twice per day.  She feels improved with regards to the right sided weakness with the steroids.  Otherwise no seizures, headaches.  Medications: Current Outpatient Medications on File Prior to Visit  Medication Sig Dispense Refill   acetaminophen (TYLENOL) 500 MG tablet Take 2 tablets (1,000 mg total) by mouth every 6 (six) hours. 30 tablet 0   ALPRAZolam (XANAX XR) 1 MG 24 hr tablet 1  qhs (Patient taking differently: Take 1 mg by mouth at bedtime.) 30 tablet 4   ALPRAZolam (XANAX) 0.5 MG tablet 2  qam   2  @2 ;00 (Patient taking differently: Take 0.5-1 mg by mouth See admin instructions. 0.5mg  oral twice daily And 0.5mg  oral extra twice daily as needed for anxiety) 120 tablet 5   Ascorbic Acid (VITAMIN C) 1000 MG tablet Take 1,000 mg by mouth daily.     aspirin 81 MG tablet Take 1 tablet (81 mg total) by mouth daily. Restart on 08/31/20 30 tablet    bisacodyl (DULCOLAX) 5 MG EC tablet Take 5 mg by mouth daily as needed for moderate constipation.     carboxymethylcellulose (REFRESH PLUS) 0.5 % SOLN Place 1-2 drops into both eyes 3 (three) times daily as needed (dry eyes).     Cholecalciferol (D3 PO) Take 1 capsule by mouth daily.     citalopram (CELEXA) 10 MG tablet Take 1 tablet (10 mg total) by mouth daily. 30 tablet 1   dexamethasone (DECADRON) 4 MG tablet Take 1 tablet (4 mg total) by mouth 2 (two) times daily. 60 tablet 0   folic acid (FOLVITE) 1 MG tablet Take 1 tablet  by mouth once daily 30 tablet 0   Lacosamide 100 MG TABS Take 1 tablet (100 mg total) by mouth 2 (two) times daily. 60 tablet 0   levETIRAcetam (KEPPRA) 100 MG/ML solution Take 15 mLs (1,500 mg total) by mouth 2 (two) times daily. 473 mL 6   loratadine (CLARITIN) 10 MG tablet Take 10 mg by mouth daily as needed for allergies.     Menthol, Topical Analgesic, (BIOFREEZE EX) Apply 1 application topically as needed (pain).     mirtazapine (REMERON) 45 MG tablet Take 1 tablet (45 mg total) by mouth at bedtime. 30 tablet 3    olopatadine (PATANOL) 0.1 % ophthalmic solution Place 1 drop into both eyes 2 (two) times daily as needed for allergies.     omeprazole (PRILOSEC) 40 MG capsule TAKE 1 CAPSULE (40 MG TOTAL) BY MOUTH 2 (TWO) TIMES DAILY BEFORE A MEAL. (Patient taking differently: Take 40 mg by mouth in the morning and at bedtime.) 180 capsule 0   polyethylene glycol (MIRALAX / GLYCOLAX) 17 g packet Take 17 g by mouth 2 (two) times daily. 72 each 0   pravastatin (PRAVACHOL) 40 MG tablet TAKE 1 TABLET (40 MG TOTAL) BY MOUTH EVERY EVENING. 90 tablet 0   Probiotic Product (PROBIOTIC DAILY PO) Take 1 capsule by mouth daily.     prochlorperazine (COMPAZINE) 10 MG tablet Take 1 tablet (10 mg total) by mouth every 6 (six) hours as needed for nausea or vomiting. 30 tablet 0   simethicone (MYLICON) 80 MG chewable tablet Chew 1 tablet (80 mg total) by mouth 4 (four) times daily as needed for flatulence (Bloating). 30 tablet 0   No current facility-administered medications on file prior to visit.    Allergies:  Allergies  Allergen Reactions   Amlodipine Swelling and Rash    Rash, swelling   Chantix [Varenicline Tartrate] Shortness Of Breath, Swelling and Other (See Comments)    Tongue swell,sob   Clarithromycin Rash   Lisinopril Hives   Simvastatin Hives   Wellbutrin [Bupropion Hcl] Hives   Lipitor [Atorvastatin]     Dizziness per patient   Sertraline     Makes her feel like she is going to kill someone   Past Medical History:  Past Medical History:  Diagnosis Date   Allergy    Anemia    Anxiety    Arthritis    Carpal tunnel syndrome    Constipation    senna C stool softeners help    Depression    Diverticulosis    Dyslipidemia    External hemorrhoids    GERD (gastroesophageal reflux disease)    Heart murmur    mild-moderate AR   Hiatal hernia    Hyperlipidemia    on meds    Hypertension    Internal hemorrhoids    lung ca with brain mets 07/2020   Osteoarthritis    Pre-diabetes    PVD  (peripheral vascular disease) (HCC)    moderate carotid disease   RBBB    Smoker    Vocal cord polyps    Past Surgical History:  Past Surgical History:  Procedure Laterality Date   ABDOMINAL HYSTERECTOMY  8469   APPLICATION OF CRANIAL NAVIGATION N/A 08/24/2020   Procedure: APPLICATION OF CRANIAL NAVIGATION;  Surgeon: Judith Part, MD;  Location: [facility];  Service: Neurosurgery;  Laterality: N/A;   COLONOSCOPY     COLONOSCOPY W/ POLYPECTOMY  2009   CRANIOTOMY Left 08/24/2020   Procedure: Left Craniotomy for Tumor Resection with Brainlab;  Surgeon: Judith Part, MD;  Location: [facility];  Service: Neurosurgery;  Laterality: Left;   HEMORRHOID SURGERY     INTERCOSTAL NERVE BLOCK Right 09/24/2020   Procedure: INTERCOSTAL NERVE BLOCK;  Surgeon: Melrose Nakayama, MD;  Location: [facility];  Service: Thoracic;  Laterality: Right;   JOINT REPLACEMENT     LYMPH NODE DISSECTION Right 09/24/2020   Procedure: LYMPH NODE DISSECTION;  Surgeon: Melrose Nakayama, MD;  Location: [facility];  Service: Thoracic;  Laterality: Right;   POLYPECTOMY     right total knee arthroplasty     Dr. Emeterio Reeve 06-04-18   [place]  08/16/2009   TOTAL KNEE ARTHROPLASTY Left 02/13/2014   Procedure: TOTAL KNEE ARTHROPLASTY;  Surgeon: Alta Corning, MD;  Location: [facility];  Service: Orthopedics;  Laterality: Left;   TOTAL KNEE ARTHROPLASTY Right 06/04/2018   Procedure: RIGHT TOTAL KNEE ARTHROPLASTY;  Surgeon: Dorna Leitz, MD;  Location: [facility];  Service: Orthopedics;  Laterality: Right;  Adductor Block   TUBAL LIGATION     Social History:  Social History   Socioeconomic History   Marital status: Married    Spouse name: Not on file   Number of children: 2   Years of education: Not on file   Highest education level: Not on file  Occupational History   Not on file  Tobacco Use   Smoking status: Former    Packs/day: 0.25    Years: 40.00    Pack years: 10.00    Types: Cigarettes    Quit date: 07/09/2020     Years since quitting: 0.9    Passive exposure: Never   Smokeless tobacco: Never   Tobacco comments:    PACK WILL LAST 3 days  Vaping Use   Vaping Use: Never used  Substance and Sexual Activity   Alcohol use: No   Drug use: No   Sexual activity: Not Currently  Other Topics Concern   Not on file  Social History Narrative   Not on file   Social Determinants of Health   Financial Resource Strain: Low Risk    Difficulty of Paying Living Expenses: Not hard at all  Food Insecurity: No Food Insecurity   Worried About Charity fundraiser in the Last Year: Never true   [place] in the Last Year: Never true  Transportation Needs: No Transportation Needs   Lack of Transportation (Medical): No   Lack of Transportation (Non-Medical): No  Physical Activity: Sufficiently Active   Days of Exercise per Week: 3 days   Minutes of Exercise per Session: 60 min  Stress: No Stress Concern Present   Feeling of Stress : Not at all  Social Connections: Socially Integrated   Frequency of Communication with Friends and Family: More than three times a week   Frequency of Social Gatherings with Friends and Family: More than three times a week   Attends Religious Services: More than 4 times per year   Active Member of [employer] or Organizations: Yes   Attends Music therapist: More than 4 times per year   Marital Status: Married  Human resources officer Violence: Not At Risk   Fear of Current or Ex-Partner: No   Emotionally Abused: No   Physically Abused: No   Sexually Abused: No   Family History:  Family History  Problem Relation Age of Onset   Cancer Father    Prostate cancer Father    Diabetes Sister    Cancer Sister    Stroke  Maternal Grandfather    Colitis Maternal Aunt    Breast cancer Maternal Aunt    Colon cancer Neg Hx    Colon polyps Neg Hx    Rectal cancer Neg Hx    Stomach cancer Neg Hx    Esophageal cancer Neg Hx     Review of Systems: Constitutional: Doesn't  report fevers, chills or abnormal weight loss Eyes: Doesn't report blurriness of vision Ears, nose, mouth, throat, and face: Doesn't report sore throat Respiratory: Doesn't report cough, dyspnea or wheezes Cardiovascular: Doesn't report palpitation, chest discomfort  Gastrointestinal:  Doesn't report nausea, constipation, diarrhea GU: Doesn't report incontinence Skin: Doesn't report skin rashes Neurological: Per HPI Musculoskeletal: Doesn't report joint pain Behavioral/Psych: ++anxiety  Physical Exam: Vitals:   06/04/21 1150  BP: 116/75  Pulse: 71  Resp: 16  Temp: 98.3 F (36.8 C)  SpO2: 100%   KPS: 80. General: Alert, cooperative, pleasant, in no acute distress Head: Normal EENT: No conjunctival injection or scleral icterus.  Lungs: Resp effort normal Cardiac: Regular rate Abdomen: Non-distended abdomen Skin: No rashes cyanosis or petechiae. Extremities: No clubbing or edema  Neurologic Exam: Mental Status: Awake, alert, attentive to examiner. Oriented to self and environment. Language is intact with regards to fluency, comprehension.  Psychomotor slowing, impaired recall. Cranial Nerves: Visual acuity is grossly normal. Visual fields are full. Extra-ocular movements intact. No ptosis. Face is symmetric Motor: Tone and bulk are normal. Power is 5/5 throughout. Reflexes are symmetric, no pathologic reflexes present.  Sensory: Intact to light touch Gait: Independent  Labs: I have reviewed the data as listed    Component Value Date/Time   NA 134 (L) 05/28/2021 0730   K 4.0 05/28/2021 0730   CL 99 05/28/2021 0730   CO2 26 05/28/2021 0730   GLUCOSE 152 (H) 05/28/2021 0730   BUN 14 05/28/2021 0730   CREATININE 0.70 05/28/2021 0730   CREATININE 0.74 05/22/2021 1339   CREATININE 0.79 03/14/2020 1016   CALCIUM 9.6 05/28/2021 0730   PROT 6.9 05/22/2021 1339   ALBUMIN 3.4 (L) 05/22/2021 1339   AST 16 05/22/2021 1339   ALT 20 05/22/2021 1339   ALKPHOS 70 05/22/2021 1339    BILITOT <0.2 (L) 05/22/2021 1339   GFRNONAA >60 05/28/2021 0730   GFRNONAA >60 05/22/2021 1339   GFRNONAA 75 03/14/2020 1016   GFRAA 87 03/14/2020 1016   Lab Results  Component Value Date   WBC 3.1 (L) 05/28/2021   NEUTROABS 4.5 05/22/2021   HGB 11.5 (L) 05/28/2021   HCT 37.9 05/28/2021   MCV 90.9 05/28/2021   PLT 241 05/28/2021    Imaging:  [facility] Clinician Interpretation: I have personally reviewed the CNS images as listed.  My interpretation, in the context of the patient's clinical presentation, is likely treatment effect  DG Chest 2 View  Result Date: 05/07/2021 CLINICAL DATA:  History of lymph node dissection 09/18/2020. EXAM: CHEST - 2 VIEW COMPARISON:  Radiographs 04/22/2021 and 02/12/2021. CT chest 03/14/2021. FINDINGS: The heart size and mediastinal contours are stable. There are stable postsurgical changes related to right upper lobectomy. The left lung is clear. There is no pleural effusion or pneumothorax. Postsurgical changes are noted in the lumbar spine. There are degenerative changes throughout the thoracic spine. No acute osseous findings. IMPRESSION: Stable postoperative chest status post right upper lobe resection. No acute findings or evidence of metastatic disease. Electronically Signed   By: Richardean Sale M.D.   On: 05/07/2021 15:37   MR BRAIN W WO CONTRAST  Result  Date: 06/01/2021 CLINICAL DATA:  Brain/CNS neoplasm, assess treatment response; metastatic lung cancer post resection and SRS EXAM: MRI HEAD WITHOUT AND WITH CONTRAST TECHNIQUE: Multiplanar, multiecho pulse sequences of the brain and surrounding structures were obtained without and with intravenous contrast. CONTRAST:  65mL MULTIHANCE GADOBENATE DIMEGLUMINE 529 MG/ML IV SOLN COMPARISON:  04/23/2021 FINDINGS: Brain: Postoperative changes in the high left frontal lobe. Remains an irregular enhancing lesion in this region measuring 1.6 x 1.5 cm on coronal series 13, image 27 (previously 1.8 x 1.6 cm).  Surrounding T2 FLAIR hyperintensity has significantly decreased. Left occipitotemporal lesion currently measures 8 x 8 mm (previously 6 x 5 mm). Adjacent T2 FLAIR hyperintensity is similar. No new mass or abnormal enhancement. No acute infarction. There are chronic blood products associated with both above lesions. Vascular: Major vessel flow voids at the skull base are preserved. Skull and upper cervical spine: Normal marrow signal is preserved. Left craniotomy. Sinuses/Orbits: Minor mucosal thickening.  Orbits are unremarkable. Other: Sella is unremarkable.  Mastoid air cells are clear. IMPRESSION: Small decrease in size of treated left frontal metastasis with significant decrease in surrounding edema. Increased size of left occipitotemporal with stable surrounding edema. Electronically Signed   By: Macy Mis M.D.   On: 06/01/2021 15:13      Assessment/Plan Brain metastasis ([facility])  Seizure ([facility])  Hannah [name] is clinically stable today.  MRI brain demonstrates significant improvement in left frontal inflammatory burden, consistent with response to corticosteroids.  Left occipital lesion, more recently treated (now 6 months out) exhibits signs of post-SRS radioinflammatory change, though mild overall.  Recommended continuing Keppra 1000mg  BID and Vimpat 100mg  BID.  Decadron should decrease to 1mg  daily if tolerated.  Will con't systemic therapy with Dr. Julien Nordmann.  We appreciate the opportunity to participate in the care of Hannah [name].    We ask that Hannah [name] return to clinic in 3 months following next brain MRI, or sooner as needed.  All questions were answered. The patient knows to call the clinic with any problems, questions or concerns. No barriers to learning were detected.  The total time spent in the encounter was 40 minutes and more than 50% was on counseling and review of test results   Ventura Sellers, MD Medical Director of Neuro-Oncology [facility] 06/04/21 11:53 AM

## 2021-06-05 ENCOUNTER — Telehealth: Payer: Self-pay | Admitting: Internal Medicine

## 2021-06-05 ENCOUNTER — Other Ambulatory Visit: Payer: Self-pay | Admitting: Internal Medicine

## 2021-06-05 NOTE — Telephone Encounter (Signed)
Will mail calender to pt

## 2021-06-06 ENCOUNTER — Telehealth: Payer: Medicare HMO

## 2021-06-09 NOTE — Progress Notes (Signed)
[facility] OFFICE PROGRESS NOTE  [name], MD [place] [address]  DIAGNOSIS: Stage IV non-small cell lung cancer (T3, N0, M1C) adenocarcinoma.  The patient presented with a right upper lobe lung mass and solitary brain metastasis.  The patient was diagnosed in February 2022.   Biomarker Findings Microsatellite status - MS-Stable Tumor Mutational Burden - 8 Muts/Mb Genomic Findings For a complete list of the genes assayed, please refer to the Appendix. KRAS G12V KEAP1 S224F TP53 P151T 7 Disease relevant genes with no reportable alterations: ALK, BRAF, EGFR, ERBB2, MET, RET, ROS1   PDL1 Expression: 90%  PRIOR THERAPY: 1) SRS to the metastatic brain lesion on 08/23/2020 under the care of Dr. Tammi Klippel and craniotomy under the care of Dr. Zada Finders on 08/24/2020. 2) S/p robotic assisted right upper lobectomy with en bloc wedge resection of the right middle lobe and lymph node dissection under the care of Dr. Roxan Hockey on September 24, 2020 3) SRS to the two new subcentimeter metastases under the care of Dr. Tammi Klippel on 11/29/20.   CURRENT THERAPY:  Systemic chemotherapy with carboplatin for AUC of 5, Alimta 500 Mg/M2 and Keytruda 200 mg IV every 3 weeks.  First dose Nov 08, 2020. Status post 9 cycles.  Starting from cycle #5, the patient started maintenance Alimta and Keytruda.    INTERVAL HISTORY: Hannah [name] 70 y.o. female returns to the clinic today for a follow-up visit accompanied by her daughter.  The patient is feeling fairly well today without any concerning complaints.  Since last being seen in clinic, the patient presented to the emergency room on 05/27/2021 with a chief complaint of right-sided weakness and gait difficulty.  The patient is currently on Keppra and Vimpat.  The patient is following closely with Dr. Mickeal Skinner and neuro-oncology for history of metastatic disease to the brain.  She is currently on Decadron but is working towards  tapering off/discontinuing due to side effects.   Otherwise the patient is feeling "good" today without any concerning complaints.  She denies any recent fever, chills, night sweats.  She reports a good appetite and she gained a few pounds. She takes Remeron for her appetite and depression.  She denies any nausea, vomiting, diarrhea, or constipation.  She denies any chest pain, shortness of breath, cough, or hemoptysis.  She denies any rashes or skin changes.  She denies headaches or visual changes. She recently had a restaging CT scan performed.  She is here today for evaluation to review her scan before starting cycle #10.       MEDICAL HISTORY: Past Medical History:  Diagnosis Date   Allergy    Anemia    Anxiety    Arthritis    Carpal tunnel syndrome    Constipation    senna C stool softeners help    Depression    Diverticulosis    Dyslipidemia    External hemorrhoids    GERD (gastroesophageal reflux disease)    Heart murmur    mild-moderate AR   Hiatal hernia    Hyperlipidemia    on meds    Hypertension    Internal hemorrhoids    lung ca with brain mets 07/2020   Osteoarthritis    Pre-diabetes    PVD (peripheral vascular disease) (HCC)    moderate carotid disease   RBBB    Smoker    Vocal cord polyps     ALLERGIES:  is allergic to amlodipine, chantix [varenicline tartrate], clarithromycin, lisinopril, simvastatin,  wellbutrin [bupropion hcl], lipitor [atorvastatin], and sertraline.  MEDICATIONS:  Current Outpatient Medications  Medication Sig Dispense Refill   acetaminophen (TYLENOL) 500 MG tablet Take 2 tablets (1,000 mg total) by mouth every 6 (six) hours. 30 tablet 0   ALPRAZolam (XANAX XR) 1 MG 24 hr tablet 1  qhs (Patient taking differently: Take 1 mg by mouth at bedtime.) 30 tablet 4   ALPRAZolam (XANAX) 0.5 MG tablet 2  qam   2  _0 ;00 (Patient taking differently: Take 0.5-1 mg by mouth See admin instructions. 0.10m oral twice daily And 0.59moral extra twice  daily as needed for anxiety) 120 tablet 5   Ascorbic Acid (VITAMIN C) 1000 MG tablet Take 1,000 mg by mouth daily.     aspirin 81 MG tablet Take 1 tablet (81 mg total) by mouth daily. Restart on 08/31/20 30 tablet    bisacodyl (DULCOLAX) 5 MG EC tablet Take 5 mg by mouth daily as needed for moderate constipation.     carboxymethylcellulose (REFRESH PLUS) 0.5 % SOLN Place 1-2 drops into both eyes 3 (three) times daily as needed (dry eyes).     Cholecalciferol (D3 PO) Take 1 capsule by mouth daily.     citalopram (CELEXA) 10 MG tablet Take 1 tablet (10 mg total) by mouth daily. 30 tablet 1   dexamethasone (DECADRON) 4 MG tablet Take 1 tablet (4 mg total) by mouth 2 (two) times daily. 60 tablet 0   folic acid (FOLVITE) 1 MG tablet Take 1 tablet (1 mg total) by mouth daily. 30 tablet 2   Lacosamide 100 MG TABS Take 1 tablet (100 mg total) by mouth 2 (two) times daily. 60 tablet 0   levETIRAcetam (KEPPRA) 100 MG/ML solution Take 15 mLs (1,500 mg total) by mouth 2 (two) times daily. 473 mL 6   loratadine (CLARITIN) 10 MG tablet Take 10 mg by mouth daily as needed for allergies.     Menthol, Topical Analgesic, (BIOFREEZE EX) Apply 1 application topically as needed (pain).     mirtazapine (REMERON) 45 MG tablet Take 1 tablet (45 mg total) by mouth at bedtime. 30 tablet 3   olopatadine (PATANOL) 0.1 % ophthalmic solution Place 1 drop into both eyes 2 (two) times daily as needed for allergies.     omeprazole (PRILOSEC) 40 MG capsule TAKE 1 CAPSULE (40 MG TOTAL) BY MOUTH 2 (TWO) TIMES DAILY BEFORE A MEAL. (Patient taking differently: Take 40 mg by mouth in the morning and at bedtime.) 180 capsule 0   polyethylene glycol (MIRALAX / GLYCOLAX) 17 g packet Take 17 g by mouth 2 (two) times daily. 72 each 0   pravastatin (PRAVACHOL) 40 MG tablet TAKE 1 TABLET (40 MG TOTAL) BY MOUTH EVERY EVENING. 90 tablet 0   Probiotic Product (PROBIOTIC DAILY PO) Take 1 capsule by mouth daily.     prochlorperazine (COMPAZINE) 10  MG tablet Take 1 tablet (10 mg total) by mouth every 6 (six) hours as needed for nausea or vomiting. 30 tablet 0   simethicone (MYLICON) 80 MG chewable tablet Chew 1 tablet (80 mg total) by mouth 4 (four) times daily as needed for flatulence (Bloating). 30 tablet 0   No current facility-administered medications for this visit.    SURGICAL HISTORY:  Past Surgical History:  Procedure Laterality Date   ABDOMINAL HYSTERECTOMY  198527 APPLICATION OF CRANIAL NAVIGATION N/A 08/24/2020   Procedure: APPLICATION OF CRANIAL NAVIGATION;  Surgeon: OsJudith PartMD;  [facility] Service: Neurosurgery;  Laterality:  N/A;   COLONOSCOPY     COLONOSCOPY W/ POLYPECTOMY  2009   CRANIOTOMY Left 08/24/2020   Procedure: Left Craniotomy for Tumor Resection with Brainlab;  Surgeon: Judith Part, MD;  Location: [facility];  Service: Neurosurgery;  Laterality: Left;   HEMORRHOID SURGERY     INTERCOSTAL NERVE BLOCK Right 09/24/2020   Procedure: INTERCOSTAL NERVE BLOCK;  Surgeon: Melrose Nakayama, MD;  Location: [facility];  Service: Thoracic;  Laterality: Right;   JOINT REPLACEMENT     LYMPH NODE DISSECTION Right 09/24/2020   Procedure: LYMPH NODE DISSECTION;  Surgeon: Melrose Nakayama, MD;  Location: [facility];  Service: Thoracic;  Laterality: Right;   POLYPECTOMY     right total knee arthroplasty     Dr. Emeterio Reeve 06-04-18   [place]  08/16/2009   TOTAL KNEE ARTHROPLASTY Left 02/13/2014   Procedure: TOTAL KNEE ARTHROPLASTY;  Surgeon: Alta Corning, MD;  Location: [facility];  Service: Orthopedics;  Laterality: Left;   TOTAL KNEE ARTHROPLASTY Right 06/04/2018   Procedure: RIGHT TOTAL KNEE ARTHROPLASTY;  Surgeon: Dorna Leitz, MD;  Location: [facility];  Service: Orthopedics;  Laterality: Right;  Adductor Block   TUBAL LIGATION      REVIEW OF SYSTEMS:   Constitutional: Negative for appetite change, chills, fatigue, fever and unexpected weight change.  HENT: Negative for mouth sores, nosebleeds, sore throat  and trouble swallowing.   Eyes: Negative for eye problems and icterus.  Respiratory: Negative for cough, hemoptysis, shortness of breath and wheezing.   Cardiovascular: Negative for chest pain and leg swelling.  Gastrointestinal: Negative for abdominal pain, constipation, diarrhea, nausea and vomiting.  Genitourinary: Negative for bladder incontinence, difficulty urinating, dysuria, frequency and hematuria.   Musculoskeletal: Negative for back pain, gait problem, neck pain and neck stiffness.  Skin: Negative for itching and rash.  Neurological: Negative for dizziness, extremity weakness, gait problem, headaches, light-headedness and seizures.  Hematological: Negative for adenopathy. Does not bruise/bleed easily.  Psychiatric/Behavioral: Negative for confusion, depression and sleep disturbance. The patient is not nervous/anxious.     PHYSICAL EXAMINATION:  Blood pressure (!) 152/77, pulse 63, temperature (!) 97 F (36.1 C), temperature source Tympanic, resp. rate 17, weight 121 lb 4.8 oz (55 kg), SpO2 100 %.  ECOG PERFORMANCE STATUS: 1  Physical Exam  Constitutional: Oriented to person, place, and time and thin appearing female and in no distress. HENT: Head: Normocephalic and atraumatic. Mouth/Throat: Oropharynx is clear and moist. No oropharyngeal exudate. No thrush.  Eyes: Conjunctivae are normal. Right eye exhibits no discharge. Left eye exhibits no discharge. No scleral icterus. Neck: Normal range of motion. Neck supple. Cardiovascular: Normal rate, regular rhythm, systolic murmur noted and intact distal pulses.   Pulmonary/Chest: Effort normal. Decreased breath sounds in right lung base. No respiratory distress. No wheezes. No rales. Abdominal: Soft. Bowel sounds are normal. Exhibits no distension and no mass. There is no tenderness.  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 No weakness in hands. No decreased sensation. No swelling or erythema. Normal range of motion.  Exhibits no edema.  Lymphadenopathy:    No cervical adenopathy.  Neurological: Alert and oriented to person, place, and time. Exhibits normal muscle tone. Gait normal. Coordination normal. Skin: Skin is warm and dry. No rash noted. Not diaphoretic. No erythema. No pallor.  Psychiatric: Mood, memory and judgment normal. Vitals reviewed  LABORATORY DATA: Lab Results  Component Value Date   WBC 6.6 06/12/2021   HGB 10.7 (L) 06/12/2021   HCT 34.9 (L) 06/12/2021  MCV 90.9 06/12/2021   PLT 257 06/12/2021      Chemistry      Component Value Date/Time   NA 139 06/12/2021 1038   K 4.2 06/12/2021 1038   CL 103 06/12/2021 1038   CO2 28 06/12/2021 1038   BUN 14 06/12/2021 1038   CREATININE 0.71 06/12/2021 1038   CREATININE 0.79 03/14/2020 1016      Component Value Date/Time   CALCIUM 9.3 06/12/2021 1038   ALKPHOS 54 06/12/2021 1038   AST 24 06/12/2021 1038   ALT 25 06/12/2021 1038   BILITOT <0.2 (L) 06/12/2021 1038       RADIOGRAPHIC STUDIES:  CT Chest W Contrast  Result Date: 06/11/2021 CLINICAL DATA:  History of non-small cell lung cancer, staging evaluation in a 70 year old female. EXAM: CT CHEST, ABDOMEN, AND PELVIS WITH CONTRAST TECHNIQUE: Multidetector CT imaging of the chest, abdomen and pelvis was performed following the standard protocol during bolus administration of intravenous contrast. CONTRAST:  51m OMNIPAQUE IOHEXOL 350 MG/ML SOLN COMPARISON:  March 14, 2021. FINDINGS: CT CHEST FINDINGS Cardiovascular: Calcified and noncalcified atheromatous plaque in the thoracic aorta. Normal heart size. No substantial pericardial effusion. No aortic dilation. Normal caliber central pulmonary vessels. Post partial lung resection in the RIGHT chest with resultant distortion of the RIGHT hilum. Mediastinum/Nodes: No thoracic inlet, axillary, mediastinal or hilar adenopathy. Esophagus grossly normal. Lungs/Pleura: Diminished RIGHT-sided pleural effusion. Post RIGHT upper lobectomy.  Stable pleural and parenchymal scarring related to postoperative changes. Airways are patent. Tiny pulmonary nodules in the LEFT chest in the 2-3 mm range in the LEFT upper lobe (image 79/4 and in the LEFT lower lobe (image 104/4, these are stable. Musculoskeletal: See below for full musculoskeletal details. CT ABDOMEN PELVIS FINDINGS Hepatobiliary: No focal, suspicious hepatic lesion. No pericholecystic stranding. No biliary duct dilation. Portal vein is patent. Cholelithiasis with proximally 1 cm gallstones 2-3 gallstones in the gallbladder. Pancreas: Normal, without mass, inflammation or ductal dilatation. Spleen: Spleen normal size and contour. Adrenals/Urinary Tract: Adrenal glands are unremarkable. Symmetric renal enhancement. No sign of hydronephrosis. No suspicious renal lesion or perinephric stranding. Urinary bladder is grossly unremarkable. urinary bladder is under distended limiting assessment. Mild cortical scarring of bilateral kidneys as before. Stomach/Bowel: No acute gastrointestinal process. Appendix is normal. Stool fills much of the colon without signs of significant distension of colon or small bowel. Vascular/Lymphatic: Aortic atherosclerosis. No sign of aneurysm. Smooth contour of the IVC. There is no gastrohepatic or hepatoduodenal ligament lymphadenopathy. No retroperitoneal or mesenteric lymphadenopathy. No pelvic sidewall lymphadenopathy. Reproductive: Post hysterectomy.  No adnexal masses. Other: No ascites.  No free air. Musculoskeletal: No acute bone finding. No destructive bone process. Spinal degenerative changes. Signs of lumbar spinal fusion and interbody cage placement extending from L3 through L5. IMPRESSION: Post RIGHT upper lobectomy. Diminished RIGHT-sided pleural effusion now with minimal pleural fluid in the RIGHT chest. No signs of recurrent or metastatic disease. Cholelithiasis without evidence of acute cholecystitis. Aortic atherosclerosis. Aortic Atherosclerosis  (ICD10-I70.0). Electronically Signed   By: GZetta BillsM.D.   On: 06/11/2021 08:39   MR BRAIN W WO CONTRAST  Result Date: 06/01/2021 CLINICAL DATA:  Brain/CNS neoplasm, assess treatment response; metastatic lung cancer post resection and SRS EXAM: MRI HEAD WITHOUT AND WITH CONTRAST TECHNIQUE: Multiplanar, multiecho pulse sequences of the brain and surrounding structures were obtained without and with intravenous contrast. CONTRAST:  167mMULTIHANCE GADOBENATE DIMEGLUMINE 529 MG/ML IV SOLN COMPARISON:  04/23/2021 FINDINGS: Brain: Postoperative changes in the high left frontal lobe. Remains an irregular  enhancing lesion in this region measuring 1.6 x 1.5 cm on coronal series 13, image 27 (previously 1.8 x 1.6 cm). Surrounding T2 FLAIR hyperintensity has significantly decreased. Left occipitotemporal lesion currently measures 8 x 8 mm (previously 6 x 5 mm). Adjacent T2 FLAIR hyperintensity is similar. No new mass or abnormal enhancement. No acute infarction. There are chronic blood products associated with both above lesions. Vascular: Major vessel flow voids at the skull base are preserved. Skull and upper cervical spine: Normal marrow signal is preserved. Left craniotomy. Sinuses/Orbits: Minor mucosal thickening.  Orbits are unremarkable. Other: Sella is unremarkable.  Mastoid air cells are clear. IMPRESSION: Small decrease in size of treated left frontal metastasis with significant decrease in surrounding edema. Increased size of left occipitotemporal with stable surrounding edema. Electronically Signed   By: Macy Mis M.D.   On: 06/01/2021 15:13   CT Abdomen Pelvis W Contrast  Result Date: 06/11/2021 CLINICAL DATA:  History of non-small cell lung cancer, staging evaluation in a 70 year old female. EXAM: CT CHEST, ABDOMEN, AND PELVIS WITH CONTRAST TECHNIQUE: Multidetector CT imaging of the chest, abdomen and pelvis was performed following the standard protocol during bolus administration of  intravenous contrast. CONTRAST:  39m OMNIPAQUE IOHEXOL 350 MG/ML SOLN COMPARISON:  March 14, 2021. FINDINGS: CT CHEST FINDINGS Cardiovascular: Calcified and noncalcified atheromatous plaque in the thoracic aorta. Normal heart size. No substantial pericardial effusion. No aortic dilation. Normal caliber central pulmonary vessels. Post partial lung resection in the RIGHT chest with resultant distortion of the RIGHT hilum. Mediastinum/Nodes: No thoracic inlet, axillary, mediastinal or hilar adenopathy. Esophagus grossly normal. Lungs/Pleura: Diminished RIGHT-sided pleural effusion. Post RIGHT upper lobectomy. Stable pleural and parenchymal scarring related to postoperative changes. Airways are patent. Tiny pulmonary nodules in the LEFT chest in the 2-3 mm range in the LEFT upper lobe (image 79/4 and in the LEFT lower lobe (image 104/4, these are stable. Musculoskeletal: See below for full musculoskeletal details. CT ABDOMEN PELVIS FINDINGS Hepatobiliary: No focal, suspicious hepatic lesion. No pericholecystic stranding. No biliary duct dilation. Portal vein is patent. Cholelithiasis with proximally 1 cm gallstones 2-3 gallstones in the gallbladder. Pancreas: Normal, without mass, inflammation or ductal dilatation. Spleen: Spleen normal size and contour. Adrenals/Urinary Tract: Adrenal glands are unremarkable. Symmetric renal enhancement. No sign of hydronephrosis. No suspicious renal lesion or perinephric stranding. Urinary bladder is grossly unremarkable. urinary bladder is under distended limiting assessment. Mild cortical scarring of bilateral kidneys as before. Stomach/Bowel: No acute gastrointestinal process. Appendix is normal. Stool fills much of the colon without signs of significant distension of colon or small bowel. Vascular/Lymphatic: Aortic atherosclerosis. No sign of aneurysm. Smooth contour of the IVC. There is no gastrohepatic or hepatoduodenal ligament lymphadenopathy. No retroperitoneal or  mesenteric lymphadenopathy. No pelvic sidewall lymphadenopathy. Reproductive: Post hysterectomy.  No adnexal masses. Other: No ascites.  No free air. Musculoskeletal: No acute bone finding. No destructive bone process. Spinal degenerative changes. Signs of lumbar spinal fusion and interbody cage placement extending from L3 through L5. IMPRESSION: Post RIGHT upper lobectomy. Diminished RIGHT-sided pleural effusion now with minimal pleural fluid in the RIGHT chest. No signs of recurrent or metastatic disease. Cholelithiasis without evidence of acute cholecystitis. Aortic atherosclerosis. Aortic Atherosclerosis (ICD10-I70.0). Electronically Signed   By: GZetta BillsM.D.   On: 06/11/2021 08:39     ASSESSMENT/PLAN:  This is a very pleasant 70year old African-American female diagnosed with stage IV (T3, N0, M1 B) non-small cell lung cancer, adenocarcinoma.  She presented with a right upper lobe lung mass with a  solitary brain metastasis.  She was diagnosed in February 2022.  Her PD-L1 expression is 90%.  She does not have any actionable mutations.   The patient completed SRS followed by craniotomy and resection on 08/23/2020 under the care of Dr. Tammi Klippel and craniotomy under the care of Dr. Zada Finders on 08/24/2020.  She then had a robot-assisted right upper lobectomy with en bloc wedge resection of the right middle lobe and lymph node dissection under the care of Dr. Roxan Hockey on September 24, 2020.   She underwent SRS to the two new subcentimeter brain metastases on 11/29/20.   The patient is currently undergoing systemic chemotherapy with carboplatin for an AUC of 5, Alimta 500 mg per metered squared, Keytruda 200 mg IV every 3 weeks.  She is status post 9 cycles and tolerated it well without any adverse side effects.  Starting from cycle #5, the patient started maintenance Alimta and Keytruda.   The patient recently had a restaging CT scan performed.  Dr. Julien Nordmann personally and independently reviewed the  scan and discussed the results with the patient today.  The scan showed no evidence of progressive disease. Dr. Julien Nordmann recommends that she proceed with cycle #10 today as scheduled.    We will see her back for follow-up visit in 3 weeks for evaluation before starting cycle #11   She will continue taking Remeron for her decreased appetite.  She will continue to follow closely with neuro oncology for her history of metastatic disease to the brain.  She is currently on Keppra and vimpat  I have sent in a refill of folic acid to her pharmacy.   The patient was advised to call immediately if she has any concerning symptoms in the interval. The patient voices understanding of current disease status and treatment options and is in agreement with the current care plan. All questions were answered. The patient knows to call the clinic with any problems, questions or concerns. We can certainly see the patient much sooner if necessary    No orders of the defined types were placed in this encounter.    Jerry Haugen L Jhana Giarratano, PA-C 06/12/21  ADDENDUM: Hematology/Oncology Attending: I had a face-to-face encounter with the patient today.  I reviewed her record, lab, scan and recommended her care plan.  This is a very pleasant 70 years old African-American female with a stage IV non-small cell lung cancer diagnosed in February 2022 with PD-L1 expression of 90% and no actionable mutations.  She is s/p craniotomy with resection of the brain tumor in addition to right upper lobectomy with lymph node dissection.  She also had recurrent disease in the brain treated with SRS. The patient has been on treatment with systemic chemotherapy initially with carboplatin, Alimta and Keytruda and currently on maintenance treatment with Alimta and Keytruda status post 9 cycles. She has been tolerating her treatment fairly well with no concerning adverse effects. The patient had repeat CT scan of the chest, abdomen  pelvis performed recently.  I personally and independently reviewed the scans and discussed the results with the patient and her daughters today. Her scan showed no concerning findings for disease progression. I recommended for the patient to continue her current treatment with maintenance Alimta and Keytruda as planned.  She will proceed with cycle #10 today. She will come back for follow-up visit in 3 weeks for evaluation before the next cycle of her treatment. The patient was advised to call immediately if she has any other concerning symptoms in the interval. The  total time spent in the appointment was 30 minutes.  Disclaimer: This note was dictated with voice recognition software. Similar sounding words can inadvertently be transcribed and may be missed upon review. Eilleen Kempf, MD 06/12/21

## 2021-06-10 ENCOUNTER — Ambulatory Visit (HOSPITAL_COMMUNITY)
Admission: RE | Admit: 2021-06-10 | Discharge: 2021-06-10 | Disposition: A | Discharge status: Home / Self Care | Payer: Medicare HMO | Source: Ambulatory Visit | Attending: Internal Medicine | Admitting: Internal Medicine

## 2021-06-10 ENCOUNTER — Other Ambulatory Visit: Payer: Self-pay

## 2021-06-10 DIAGNOSIS — R918 Other nonspecific abnormal finding of lung field: Secondary | ICD-10-CM | POA: Diagnosis not present

## 2021-06-10 DIAGNOSIS — J90 Pleural effusion, not elsewhere classified: Secondary | ICD-10-CM | POA: Diagnosis not present

## 2021-06-10 DIAGNOSIS — N3289 Other specified disorders of bladder: Secondary | ICD-10-CM | POA: Diagnosis not present

## 2021-06-10 DIAGNOSIS — C3490 Malignant neoplasm of unspecified part of unspecified bronchus or lung: Secondary | ICD-10-CM

## 2021-06-10 DIAGNOSIS — I700 Atherosclerosis of aorta: Secondary | ICD-10-CM | POA: Diagnosis not present

## 2021-06-10 DIAGNOSIS — K8020 Calculus of gallbladder without cholecystitis without obstruction: Secondary | ICD-10-CM | POA: Diagnosis not present

## 2021-06-10 DIAGNOSIS — R911 Solitary pulmonary nodule: Secondary | ICD-10-CM | POA: Diagnosis not present

## 2021-06-10 MED ORDER — IOHEXOL 350 MG/ML SOLN
75.0000 mL | Freq: Once | INTRAVENOUS | Status: AC | PRN
  Administered 2021-06-10: 75 mL via INTRAVENOUS

## 2021-06-10 MED ORDER — SODIUM CHLORIDE (PF) 0.9 % IJ SOLN
INTRAMUSCULAR | Status: AC
  Filled 2021-06-10: qty 50

## 2021-06-12 ENCOUNTER — Inpatient Hospital Stay: Payer: Medicare HMO

## 2021-06-12 ENCOUNTER — Inpatient Hospital Stay (HOSPITAL_BASED_OUTPATIENT_CLINIC_OR_DEPARTMENT_OTHER): Payer: Medicare HMO | Admitting: Physician Assistant

## 2021-06-12 ENCOUNTER — Encounter: Payer: Self-pay | Admitting: Internal Medicine

## 2021-06-12 ENCOUNTER — Other Ambulatory Visit: Payer: Self-pay

## 2021-06-12 VITALS — BP 152/77 | HR 63 | Temp 97.0°F | Resp 17 | Wt 121.3 lb

## 2021-06-12 DIAGNOSIS — E785 Hyperlipidemia, unspecified: Secondary | ICD-10-CM | POA: Diagnosis not present

## 2021-06-12 DIAGNOSIS — C7931 Secondary malignant neoplasm of brain: Secondary | ICD-10-CM | POA: Diagnosis not present

## 2021-06-12 DIAGNOSIS — F419 Anxiety disorder, unspecified: Secondary | ICD-10-CM | POA: Diagnosis not present

## 2021-06-12 DIAGNOSIS — C3411 Malignant neoplasm of upper lobe, right bronchus or lung: Secondary | ICD-10-CM

## 2021-06-12 DIAGNOSIS — F32A Depression, unspecified: Secondary | ICD-10-CM | POA: Diagnosis not present

## 2021-06-12 DIAGNOSIS — I10 Essential (primary) hypertension: Secondary | ICD-10-CM | POA: Diagnosis not present

## 2021-06-12 DIAGNOSIS — Z5112 Encounter for antineoplastic immunotherapy: Secondary | ICD-10-CM | POA: Diagnosis not present

## 2021-06-12 DIAGNOSIS — Z5111 Encounter for antineoplastic chemotherapy: Secondary | ICD-10-CM | POA: Diagnosis not present

## 2021-06-12 DIAGNOSIS — R531 Weakness: Secondary | ICD-10-CM | POA: Diagnosis not present

## 2021-06-12 LAB — CBC WITH DIFFERENTIAL (CANCER CENTER ONLY)
Abs Immature Granulocytes: 0.12 10*3/uL — ABNORMAL HIGH (ref 0.00–0.07)
Basophils Absolute: 0.1 10*3/uL (ref 0.0–0.1)
Basophils Relative: 1 %
Eosinophils Absolute: 0 10*3/uL (ref 0.0–0.5)
Eosinophils Relative: 0 %
HCT: 34.9 % — ABNORMAL LOW (ref 36.0–46.0)
Hemoglobin: 10.7 g/dL — ABNORMAL LOW (ref 12.0–15.0)
Immature Granulocytes: 2 %
Lymphocytes Relative: 25 %
Lymphs Abs: 1.6 10*3/uL (ref 0.7–4.0)
MCH: 27.9 pg (ref 26.0–34.0)
MCHC: 30.7 g/dL (ref 30.0–36.0)
MCV: 90.9 fL (ref 80.0–100.0)
Monocytes Absolute: 0.8 10*3/uL (ref 0.1–1.0)
Monocytes Relative: 13 %
Neutro Abs: 3.9 10*3/uL (ref 1.7–7.7)
Neutrophils Relative %: 59 %
Platelet Count: 257 10*3/uL (ref 150–400)
RBC: 3.84 MIL/uL — ABNORMAL LOW (ref 3.87–5.11)
RDW: 13.8 % (ref 11.5–15.5)
WBC Count: 6.6 10*3/uL (ref 4.0–10.5)
nRBC: 0 % (ref 0.0–0.2)

## 2021-06-12 LAB — CMP (CANCER CENTER ONLY)
ALT: 25 U/L (ref 0–44)
AST: 24 U/L (ref 15–41)
Albumin: 3.4 g/dL — ABNORMAL LOW (ref 3.5–5.0)
Alkaline Phosphatase: 54 U/L (ref 38–126)
Anion gap: 8 (ref 5–15)
BUN: 14 mg/dL (ref 8–23)
CO2: 28 mmol/L (ref 22–32)
Calcium: 9.3 mg/dL (ref 8.9–10.3)
Chloride: 103 mmol/L (ref 98–111)
Creatinine: 0.71 mg/dL (ref 0.44–1.00)
GFR, Estimated: >60 mL/min (ref >60)
Glucose, Bld: 98 mg/dL (ref 70–99)
Potassium: 4.2 mmol/L (ref 3.5–5.1)
Sodium: 139 mmol/L (ref 135–145)
Total Bilirubin: <0.2 mg/dL — ABNORMAL LOW (ref 0.3–1.2)
Total Protein: 7 g/dL (ref 6.5–8.1)

## 2021-06-12 LAB — TSH: TSH: 1.166 u[IU]/mL (ref 0.308–3.960)

## 2021-06-12 MED ORDER — SODIUM CHLORIDE 0.9 % IV SOLN: Freq: Once | INTRAVENOUS | Status: AC

## 2021-06-12 MED ORDER — PROCHLORPERAZINE MALEATE 10 MG PO TABS
10.0000 mg | ORAL_TABLET | Freq: Once | ORAL | Status: AC
  Administered 2021-06-12: 12:00:00 10 mg via ORAL
  Filled 2021-06-12: qty 1

## 2021-06-12 MED ORDER — SODIUM CHLORIDE 0.9 % IV SOLN
500.0000 mg/m2 | Freq: Once | INTRAVENOUS | Status: AC
  Administered 2021-06-12: 13:00:00 700 mg via INTRAVENOUS
  Filled 2021-06-12: qty 20

## 2021-06-12 MED ORDER — SODIUM CHLORIDE 0.9 % IV SOLN
200.0000 mg | Freq: Once | INTRAVENOUS | Status: AC
  Administered 2021-06-12: 13:00:00 200 mg via INTRAVENOUS
  Filled 2021-06-12: qty 8

## 2021-06-12 MED ORDER — FOLIC ACID 1 MG PO TABS: 1.0000 mg | ORAL_TABLET | Freq: Every day | ORAL | 2 refills | Status: DC

## 2021-06-12 NOTE — Patient Instructions (Addendum)
[facility]  Discharge Instructions: Thank you for choosing [facility] to provide your oncology and hematology care.   If you have a lab appointment with the [facility], please go directly to the [facility] and check in at the registration area.   Wear comfortable clothing and clothing appropriate for easy access to any Portacath or PICC line.   We strive to give you quality time with your provider. You may need to reschedule your appointment if you arrive late (15 or more minutes).  Arriving late affects you and other patients whose appointments are after yours.  Also, if you miss three or more appointments without notifying the office, you may be dismissed from the clinic at the providers discretion.      For prescription refill requests, have your pharmacy contact our office and allow 72 hours for refills to be completed.    Today you received the following chemotherapy and/or immunotherapy agents: pembrolizumab and pemetrexed    To help prevent nausea and vomiting after your treatment, we encourage you to take your nausea medication as directed.  BELOW ARE SYMPTOMS THAT SHOULD BE REPORTED IMMEDIATELY: *FEVER GREATER THAN 100.4 F (38 C) OR HIGHER *CHILLS OR SWEATING *NAUSEA AND VOMITING THAT IS NOT CONTROLLED WITH YOUR NAUSEA MEDICATION *UNUSUAL SHORTNESS OF BREATH *UNUSUAL BRUISING OR BLEEDING *URINARY PROBLEMS (pain or burning when urinating, or frequent urination) *BOWEL PROBLEMS (unusual diarrhea, constipation, pain near the anus) TENDERNESS IN MOUTH AND THROAT WITH OR WITHOUT PRESENCE OF ULCERS (sore throat, sores in mouth, or a toothache) UNUSUAL RASH, SWELLING OR PAIN  UNUSUAL VAGINAL DISCHARGE OR ITCHING   Items with * indicate a potential emergency and should be followed up as soon as possible or go to the Emergency Department if any problems should occur.  Please show the CHEMOTHERAPY ALERT CARD or IMMUNOTHERAPY ALERT  CARD at check-in to the Emergency Department and triage nurse.  Should you have questions after your visit or need to cancel or reschedule your appointment, please contact [facility]  Dept: [phone]  and follow the prompts.  Office hours are 8:00 a.m. to 4:30 p.m. Monday - Friday. Please note that voicemails left after 4:00 p.m. may not be returned until the following business day.  We are closed weekends and major holidays. You have access to a nurse at all times for urgent questions. Please call the main number to the clinic Dept: [phone] and follow the prompts.   For any non-urgent questions, you may also contact your provider using MyChart. We now offer e-Visits for anyone 20 and older to request care online for non-urgent symptoms. For details visit [email or web address].   Also download the MyChart app! Go to the app store, search "MyChart", open the app, select [facility], and log in with your MyChart username and password.  Due to Covid, a mask is required upon entering the hospital/clinic. If you do not have a mask, one will be given to you upon arrival. For doctor visits, patients may have 1 support person aged 72 or older with them. For treatment visits, patients cannot have anyone with them due to current Covid guidelines and our immunocompromised population.

## 2021-06-15 ENCOUNTER — Other Ambulatory Visit: Payer: Self-pay | Admitting: Hematology and Oncology

## 2021-06-16 ENCOUNTER — Other Ambulatory Visit: Payer: Self-pay

## 2021-06-17 ENCOUNTER — Other Ambulatory Visit (HOSPITAL_COMMUNITY): Payer: Self-pay | Admitting: *Deleted

## 2021-06-17 ENCOUNTER — Telehealth (HOSPITAL_COMMUNITY): Payer: Self-pay | Admitting: *Deleted

## 2021-06-17 MED ORDER — ALPRAZOLAM 0.5 MG PO TABS: ORAL_TABLET | ORAL | 5 refills | Status: DC

## 2021-06-17 MED ORDER — ALPRAZOLAM ER 0.5 MG PO TB24: ORAL_TABLET | ORAL | 0 refills | Status: DC

## 2021-06-17 NOTE — Telephone Encounter (Signed)
Writer spoke with pt daughter who called to inform that pt has been started on medications for seizures and since has been sedated and sleeping more. Daughter asked about tapering the Xanax down due to sedation pt experiencing as pt cannot change any other meds. Writer spoke with Dr. Casimiro Needle who advised changing Xanax XR 1 mg QHS to Xanax 0.5mg  at HS and to take the Xanax IR 0.5mg  Qam and may take one more IR 0.5mg  QD PRN. [name] verbalizes understanding and agrees this should help. This nurse encouraged daughter to call the office/nurse with any further concerns. Pt has an appointment scheduled with Dr. Casimiro Needle on 07/24/21.

## 2021-06-18 ENCOUNTER — Telehealth (HOSPITAL_COMMUNITY): Payer: Self-pay | Admitting: *Deleted

## 2021-06-18 NOTE — Telephone Encounter (Signed)
PA for Xanax 0.5 mg ER submitted via CoverMyMeds. PA Case # [id number]. Awaiting determination.

## 2021-06-19 ENCOUNTER — Telehealth (HOSPITAL_COMMUNITY): Payer: Self-pay | Admitting: *Deleted

## 2021-06-19 NOTE — Telephone Encounter (Signed)
PA for Alprazolam ER 0.5 mg tablets has been Approved through 06/29/22 by [place] review.

## 2021-06-20 ENCOUNTER — Other Ambulatory Visit: Payer: Self-pay | Admitting: Hematology and Oncology

## 2021-06-20 ENCOUNTER — Telehealth: Payer: Self-pay | Admitting: *Deleted

## 2021-06-20 MED ORDER — DEXAMETHASONE 1 MG PO TABS: 1.0000 mg | ORAL_TABLET | Freq: Every day | ORAL | 3 refills | Status: DC

## 2021-06-20 MED ORDER — LACOSAMIDE 100 MG PO TABS
100.0000 mg | ORAL_TABLET | Freq: Two times a day (BID) | ORAL | 3 refills | Status: DC
Start: 2021-06-20 — End: 2021-10-21

## 2021-06-20 NOTE — Telephone Encounter (Signed)
Patients daughter [name] called to request refill under Dr Mickeal Skinner for Lacosamide to pharmacy on file.   She also wants to report that patient reported two different episodes yesterday  with sharp shooting pains in her head.  She has been slower in her thought processes and struggling to form her words and right arm stiffness. She has been off of the decadron for 1 week now.  Wants to know if she should return to a low dose of the Decadron or not.   Routing to provider.

## 2021-06-20 NOTE — Telephone Encounter (Signed)
Refill will be handled by Dr Mickeal Skinner.

## 2021-07-03 ENCOUNTER — Inpatient Hospital Stay (HOSPITAL_BASED_OUTPATIENT_CLINIC_OR_DEPARTMENT_OTHER): Payer: Medicare HMO | Admitting: Internal Medicine

## 2021-07-03 ENCOUNTER — Inpatient Hospital Stay: Payer: Medicare HMO | Attending: Internal Medicine

## 2021-07-03 ENCOUNTER — Inpatient Hospital Stay: Payer: Medicare HMO

## 2021-07-03 ENCOUNTER — Other Ambulatory Visit: Payer: Self-pay

## 2021-07-03 ENCOUNTER — Encounter: Payer: Self-pay | Admitting: Internal Medicine

## 2021-07-03 VITALS — BP 129/68 | HR 79 | Temp 97.0°F | Resp 19 | Ht 61.0 in | Wt 123.6 lb

## 2021-07-03 DIAGNOSIS — Z7952 Long term (current) use of systemic steroids: Secondary | ICD-10-CM | POA: Insufficient documentation

## 2021-07-03 DIAGNOSIS — Z5112 Encounter for antineoplastic immunotherapy: Secondary | ICD-10-CM

## 2021-07-03 DIAGNOSIS — R5383 Other fatigue: Secondary | ICD-10-CM | POA: Insufficient documentation

## 2021-07-03 DIAGNOSIS — Z8719 Personal history of other diseases of the digestive system: Secondary | ICD-10-CM | POA: Diagnosis not present

## 2021-07-03 DIAGNOSIS — C7931 Secondary malignant neoplasm of brain: Secondary | ICD-10-CM | POA: Insufficient documentation

## 2021-07-03 DIAGNOSIS — C3411 Malignant neoplasm of upper lobe, right bronchus or lung: Secondary | ICD-10-CM | POA: Insufficient documentation

## 2021-07-03 DIAGNOSIS — Z87891 Personal history of nicotine dependence: Secondary | ICD-10-CM | POA: Diagnosis not present

## 2021-07-03 DIAGNOSIS — K5900 Constipation, unspecified: Secondary | ICD-10-CM | POA: Insufficient documentation

## 2021-07-03 DIAGNOSIS — Z823 Family history of stroke: Secondary | ICD-10-CM | POA: Diagnosis not present

## 2021-07-03 DIAGNOSIS — Z5111 Encounter for antineoplastic chemotherapy: Secondary | ICD-10-CM | POA: Insufficient documentation

## 2021-07-03 DIAGNOSIS — J90 Pleural effusion, not elsewhere classified: Secondary | ICD-10-CM | POA: Insufficient documentation

## 2021-07-03 DIAGNOSIS — Z833 Family history of diabetes mellitus: Secondary | ICD-10-CM | POA: Diagnosis not present

## 2021-07-03 DIAGNOSIS — Z8379 Family history of other diseases of the digestive system: Secondary | ICD-10-CM | POA: Insufficient documentation

## 2021-07-03 DIAGNOSIS — Z79899 Other long term (current) drug therapy: Secondary | ICD-10-CM | POA: Insufficient documentation

## 2021-07-03 DIAGNOSIS — Z888 Allergy status to other drugs, medicaments and biological substances status: Secondary | ICD-10-CM | POA: Diagnosis not present

## 2021-07-03 DIAGNOSIS — K8020 Calculus of gallbladder without cholecystitis without obstruction: Secondary | ICD-10-CM | POA: Diagnosis not present

## 2021-07-03 DIAGNOSIS — I700 Atherosclerosis of aorta: Secondary | ICD-10-CM | POA: Insufficient documentation

## 2021-07-03 DIAGNOSIS — F419 Anxiety disorder, unspecified: Secondary | ICD-10-CM | POA: Insufficient documentation

## 2021-07-03 DIAGNOSIS — Z8042 Family history of malignant neoplasm of prostate: Secondary | ICD-10-CM | POA: Diagnosis not present

## 2021-07-03 DIAGNOSIS — D496 Neoplasm of unspecified behavior of brain: Secondary | ICD-10-CM

## 2021-07-03 DIAGNOSIS — F32A Depression, unspecified: Secondary | ICD-10-CM | POA: Insufficient documentation

## 2021-07-03 DIAGNOSIS — R531 Weakness: Secondary | ICD-10-CM | POA: Diagnosis not present

## 2021-07-03 DIAGNOSIS — Z803 Family history of malignant neoplasm of breast: Secondary | ICD-10-CM | POA: Insufficient documentation

## 2021-07-03 DIAGNOSIS — Z809 Family history of malignant neoplasm, unspecified: Secondary | ICD-10-CM | POA: Diagnosis not present

## 2021-07-03 DIAGNOSIS — K219 Gastro-esophageal reflux disease without esophagitis: Secondary | ICD-10-CM | POA: Insufficient documentation

## 2021-07-03 DIAGNOSIS — I10 Essential (primary) hypertension: Secondary | ICD-10-CM | POA: Diagnosis not present

## 2021-07-03 DIAGNOSIS — E785 Hyperlipidemia, unspecified: Secondary | ICD-10-CM | POA: Insufficient documentation

## 2021-07-03 LAB — CBC WITH DIFFERENTIAL (CANCER CENTER ONLY)
Abs Immature Granulocytes: 0.06 10*3/uL (ref 0.00–0.07)
Basophils Absolute: 0.1 10*3/uL (ref 0.0–0.1)
Basophils Relative: 1 %
Eosinophils Absolute: 0 10*3/uL (ref 0.0–0.5)
Eosinophils Relative: 0 %
HCT: 36.1 % (ref 36.0–46.0)
Hemoglobin: 11.2 g/dL — ABNORMAL LOW (ref 12.0–15.0)
Immature Granulocytes: 1 %
Lymphocytes Relative: 36 %
Lymphs Abs: 2.6 10*3/uL (ref 0.7–4.0)
MCH: 27.7 pg (ref 26.0–34.0)
MCHC: 31 g/dL (ref 30.0–36.0)
MCV: 89.1 fL (ref 80.0–100.0)
Monocytes Absolute: 0.8 10*3/uL (ref 0.1–1.0)
Monocytes Relative: 11 %
Neutro Abs: 3.7 10*3/uL (ref 1.7–7.7)
Neutrophils Relative %: 51 %
Platelet Count: 282 10*3/uL (ref 150–400)
RBC: 4.05 MIL/uL (ref 3.87–5.11)
RDW: 13.3 % (ref 11.5–15.5)
WBC Count: 7.2 10*3/uL (ref 4.0–10.5)
nRBC: 0 % (ref 0.0–0.2)

## 2021-07-03 LAB — CMP (CANCER CENTER ONLY)
ALT: 23 U/L (ref 0–44)
AST: 26 U/L (ref 15–41)
Albumin: 4 g/dL (ref 3.5–5.0)
Alkaline Phosphatase: 59 U/L (ref 38–126)
Anion gap: 7 (ref 5–15)
BUN: 15 mg/dL (ref 8–23)
CO2: 29 mmol/L (ref 22–32)
Calcium: 9.9 mg/dL (ref 8.9–10.3)
Chloride: 104 mmol/L (ref 98–111)
Creatinine: 0.78 mg/dL (ref 0.44–1.00)
GFR, Estimated: >60 mL/min (ref >60)
Glucose, Bld: 114 mg/dL — ABNORMAL HIGH (ref 70–99)
Potassium: 3.6 mmol/L (ref 3.5–5.1)
Sodium: 140 mmol/L (ref 135–145)
Total Bilirubin: 0.2 mg/dL — ABNORMAL LOW (ref 0.3–1.2)
Total Protein: 7.5 g/dL (ref 6.5–8.1)

## 2021-07-03 LAB — TSH: TSH: 1.228 u[IU]/mL (ref 0.308–3.960)

## 2021-07-03 MED ORDER — PROCHLORPERAZINE MALEATE 10 MG PO TABS
10.0000 mg | ORAL_TABLET | Freq: Once | ORAL | Status: AC
  Administered 2021-07-03: 10 mg via ORAL

## 2021-07-03 MED ORDER — SODIUM CHLORIDE 0.9 % IV SOLN: Freq: Once | INTRAVENOUS | Status: AC

## 2021-07-03 MED ORDER — CYANOCOBALAMIN 1000 MCG/ML IJ SOLN
1000.0000 ug | Freq: Once | INTRAMUSCULAR | Status: AC
  Administered 2021-07-03: 1000 ug via INTRAMUSCULAR
  Filled 2021-07-03: qty 1

## 2021-07-03 MED ORDER — SODIUM CHLORIDE 0.9 % IV SOLN
200.0000 mg | Freq: Once | INTRAVENOUS | Status: AC
  Administered 2021-07-03: 200 mg via INTRAVENOUS
  Filled 2021-07-03: qty 8

## 2021-07-03 MED ORDER — SODIUM CHLORIDE 0.9 % IV SOLN
500.0000 mg/m2 | Freq: Once | INTRAVENOUS | Status: AC
  Administered 2021-07-03: 700 mg via INTRAVENOUS
  Filled 2021-07-03: qty 20

## 2021-07-03 NOTE — Progress Notes (Signed)
[facility] [phone]   [phone]  OFFICE PROGRESS NOTE  [name], MD [place] [address]  DIAGNOSIS: Stage IV non-small cell lung cancer (T, N0, M1C) adenocarcinoma.  The patient presented with a and solitary brain metastasis.  The patient was diagnosed in February 2022.  Biomarker Findings Microsatellite status - MS-Stable Tumor Mutational Burden - 8 Muts/Mb Genomic Findings For a complete [name] of the genes assayed, please refer to the Appendix. KRAS G12V KEAP1 S224F TP53 P151T 7 Disease relevant genes with no reportable alterations: ALK, BRAF, EGFR, ERBB2, MET, RET, ROS1  PDL1 Expression: 50%    PRIOR THERAPY: 1) SRS to the metastatic brain lesion on 08/23/2020 under the care of Dr. Tammi Klippel and craniotomy under the care of Dr. Zada Finders scheduled for 08/24/2020. 2) S/p robotic assisted right upper lobectomy with en bloc wedge resection of the right middle lobe and lymph node dissection under the care of Dr. Roxan Hockey on September 24, 2020. 3) status post SRS to a new subcentimeter brain lesions under the care of Dr. Lisbeth Renshaw.   CURRENT THERAPY: Systemic chemotherapy with carboplatin for AUC of 5, Alimta 500 Mg/M2 and Keytruda 200 mg IV every 3 weeks.  First dose Nov 07, 2020.  Status post 10 cycles.  Starting from cycle #5 the patient will be on maintenance treatment with Alimta and Keytruda every 3 weeks.   INTERVAL HISTORY: Hannah [name] 71 y.o. female returns to the clinic today for follow-up visit.  The patient is feeling fine today with no concerning complaints except for mild fatigue.  She is currently on Decadron 1 mg p.o. daily because of the vasogenic edema of the brain.  The patient denied having any current chest pain, shortness of breath, cough or hemoptysis.  She denied having any fever or chills.  She has no nausea, vomiting, diarrhea but has constipation.  She has no recent weight loss or night sweats.   She continues to tolerate her treatment with maintenance Alimta and Keytruda fairly well.  She is here for evaluation before starting cycle #11.   MEDICAL HISTORY: Past Medical History:  Diagnosis Date   Allergy    Anemia    Anxiety    Arthritis    Carpal tunnel syndrome    Constipation    senna C stool softeners help    Depression    Diverticulosis    Dyslipidemia    External hemorrhoids    GERD (gastroesophageal reflux disease)    Heart murmur    mild-moderate AR   Hiatal hernia    Hyperlipidemia    on meds    Hypertension    Internal hemorrhoids    lung ca with brain mets 07/2020   Osteoarthritis    Pre-diabetes    PVD (peripheral vascular disease) (HCC)    moderate carotid disease   RBBB    Smoker    Vocal cord polyps     ALLERGIES:  is allergic to amlodipine, chantix [varenicline tartrate], clarithromycin, lisinopril, simvastatin, wellbutrin [bupropion hcl], lipitor [atorvastatin], and sertraline.  MEDICATIONS:  Current Outpatient Medications  Medication Sig Dispense Refill   acetaminophen (TYLENOL) 500 MG tablet Take 2 tablets (1,000 mg total) by mouth every 6 (six) hours. 30 tablet 0   ALPRAZolam (XANAX XR) 0.5 MG 24 hr tablet Take one tablet (0.5 mg total) by mouth every night at bedtime. 36 tablet 0   ALPRAZolam (XANAX) 0.5 MG tablet Take one tablet (0.5 mg total) by  mouth qam and may take one more tablet (0.5 mg total) qd prn for anxiety. 120 tablet 5   Ascorbic Acid (VITAMIN C) 1000 MG tablet Take 1,000 mg by mouth daily.     aspirin 81 MG tablet Take 1 tablet (81 mg total) by mouth daily. Restart on 08/31/20 30 tablet    bisacodyl (DULCOLAX) 5 MG EC tablet Take 5 mg by mouth daily as needed for moderate constipation.     carboxymethylcellulose (REFRESH PLUS) 0.5 % SOLN Place 1-2 drops into both eyes 3 (three) times daily as needed (dry eyes).     Cholecalciferol (D3 PO) Take 1 capsule by mouth daily.     citalopram (CELEXA) 10 MG tablet Take 1 tablet (10 mg  total) by mouth daily. 30 tablet 1   dexamethasone (DECADRON) 1 MG tablet Take 1 tablet (1 mg total) by mouth daily. 30 tablet 3   folic acid (FOLVITE) 1 MG tablet Take 1 tablet (1 mg total) by mouth daily. 30 tablet 2   Lacosamide 100 MG TABS Take 1 tablet (100 mg total) by mouth 2 (two) times daily. 60 tablet 3   levETIRAcetam (KEPPRA) 100 MG/ML solution Take 15 mLs (1,500 mg total) by mouth 2 (two) times daily. 473 mL 6   loratadine (CLARITIN) 10 MG tablet Take 10 mg by mouth daily as needed for allergies.     Menthol, Topical Analgesic, (BIOFREEZE EX) Apply 1 application topically as needed (pain).     mirtazapine (REMERON) 45 MG tablet Take 1 tablet (45 mg total) by mouth at bedtime. 30 tablet 3   olopatadine (PATANOL) 0.1 % ophthalmic solution Place 1 drop into both eyes 2 (two) times daily as needed for allergies.     omeprazole (PRILOSEC) 40 MG capsule TAKE 1 CAPSULE (40 MG TOTAL) BY MOUTH 2 (TWO) TIMES DAILY BEFORE A MEAL. (Patient taking differently: Take 40 mg by mouth in the morning and at bedtime.) 180 capsule 0   polyethylene glycol (MIRALAX / GLYCOLAX) 17 g packet Take 17 g by mouth 2 (two) times daily. 72 each 0   pravastatin (PRAVACHOL) 40 MG tablet TAKE 1 TABLET (40 MG TOTAL) BY MOUTH EVERY EVENING. 90 tablet 0   Probiotic Product (PROBIOTIC DAILY PO) Take 1 capsule by mouth daily.     prochlorperazine (COMPAZINE) 10 MG tablet Take 1 tablet (10 mg total) by mouth every 6 (six) hours as needed for nausea or vomiting. 30 tablet 0   simethicone (MYLICON) 80 MG chewable tablet Chew 1 tablet (80 mg total) by mouth 4 (four) times daily as needed for flatulence (Bloating). 30 tablet 0   No current facility-administered medications for this visit.    SURGICAL HISTORY:  Past Surgical History:  Procedure Laterality Date   ABDOMINAL HYSTERECTOMY  1994   APPLICATION OF CRANIAL NAVIGATION N/A 08/24/2020   Procedure: APPLICATION OF CRANIAL NAVIGATION;  Surgeon: Jadene Pierini, MD;   Location: [facility] OR;  Service: Neurosurgery;  Laterality: N/A;   COLONOSCOPY     COLONOSCOPY W/ POLYPECTOMY  2009   CRANIOTOMY Left 08/24/2020   Procedure: Left Craniotomy for Tumor Resection with Brainlab;  Surgeon: Jadene Pierini, MD;  Location: [facility] OR;  Service: Neurosurgery;  Laterality: Left;   HEMORRHOID SURGERY     INTERCOSTAL NERVE BLOCK Right 09/24/2020   Procedure: INTERCOSTAL NERVE BLOCK;  Surgeon: Loreli Slot, MD;  Location: [facility] OR;  Service: Thoracic;  Laterality: Right;   JOINT REPLACEMENT     LYMPH NODE DISSECTION Right 09/24/2020  Procedure: LYMPH NODE DISSECTION;  Surgeon: Melrose Nakayama, MD;  Location: [facility] OR;  Service: Thoracic;  Laterality: Right;   POLYPECTOMY     right total knee arthroplasty     Dr. Emeterio Reeve 06-04-18   [facility]  08/16/2009   TOTAL KNEE ARTHROPLASTY Left 02/13/2014   Procedure: TOTAL KNEE ARTHROPLASTY;  Surgeon: Alta Corning, MD;  Location: [facility];  Service: Orthopedics;  Laterality: Left;   TOTAL KNEE ARTHROPLASTY Right 06/04/2018   Procedure: RIGHT TOTAL KNEE ARTHROPLASTY;  Surgeon: Dorna Leitz, MD;  Location: [facility] ORS;  Service: Orthopedics;  Laterality: Right;  Adductor Block   TUBAL LIGATION      REVIEW OF SYSTEMS:  A comprehensive review of systems was negative except for: Constitutional: positive for fatigue   PHYSICAL EXAMINATION: General appearance: alert, cooperative, fatigued, and no distress Head: Normocephalic, without obvious abnormality, atraumatic Neck: no adenopathy, no JVD, supple, symmetrical, trachea midline, and thyroid not enlarged, symmetric, no tenderness/mass/nodules Lymph nodes: Cervical, supraclavicular, and axillary nodes normal. Resp: clear to auscultation bilaterally Back: symmetric, no curvature. ROM normal. No CVA tenderness. Cardio: regular rate and rhythm, S1, S2 normal, no murmur, click, rub or gallop GI: soft, non-tender; bowel sounds normal; no masses,  no organomegaly Extremities: extremities  normal, atraumatic, no cyanosis or edema  ECOG PERFORMANCE STATUS: 1 - Symptomatic but completely ambulatory  There were no vitals taken for this visit.  LABORATORY DATA: Lab Results  Component Value Date   WBC 7.2 07/03/2021   HGB 11.2 (L) 07/03/2021   HCT 36.1 07/03/2021   MCV 89.1 07/03/2021   PLT 282 07/03/2021      Chemistry      Component Value Date/Time   NA 139 06/12/2021 1038   K 4.2 06/12/2021 1038   CL 103 06/12/2021 1038   CO2 28 06/12/2021 1038   BUN 14 06/12/2021 1038   CREATININE 0.71 06/12/2021 1038   CREATININE 0.79 03/14/2020 1016      Component Value Date/Time   CALCIUM 9.3 06/12/2021 1038   ALKPHOS 54 06/12/2021 1038   AST 24 06/12/2021 1038   ALT 25 06/12/2021 1038   BILITOT <0.2 (L) 06/12/2021 1038       RADIOGRAPHIC STUDIES: CT Chest W Contrast  Result Date: 06/11/2021 CLINICAL DATA:  History of non-small cell lung cancer, staging evaluation in a 71 year old female. EXAM: CT CHEST, ABDOMEN, AND PELVIS WITH CONTRAST TECHNIQUE: Multidetector CT imaging of the chest, abdomen and pelvis was performed following the standard protocol during bolus administration of intravenous contrast. CONTRAST:  63mL OMNIPAQUE IOHEXOL 350 MG/ML SOLN COMPARISON:  March 14, 2021. FINDINGS: CT CHEST FINDINGS Cardiovascular: Calcified and noncalcified atheromatous plaque in the thoracic aorta. Normal heart size. No substantial pericardial effusion. No aortic dilation. Normal caliber central pulmonary vessels. Post partial lung resection in the RIGHT chest with resultant distortion of the RIGHT hilum. Mediastinum/Nodes: No thoracic inlet, axillary, mediastinal or hilar adenopathy. Esophagus grossly normal. Lungs/Pleura: Diminished RIGHT-sided pleural effusion. Post RIGHT upper lobectomy. Stable pleural and parenchymal scarring related to postoperative changes. Airways are patent. Tiny pulmonary nodules in the LEFT chest in the 2-3 mm range in the LEFT upper lobe (image 79/4  and in the LEFT lower lobe (image 104/4, these are stable. Musculoskeletal: See below for full musculoskeletal details. CT ABDOMEN PELVIS FINDINGS Hepatobiliary: No focal, suspicious hepatic lesion. No pericholecystic stranding. No biliary duct dilation. Portal vein is patent. Cholelithiasis with proximally 1 cm gallstones 2-3 gallstones in the gallbladder. Pancreas: Normal, without mass, inflammation or ductal dilatation. Spleen:  Spleen normal size and contour. Adrenals/Urinary Tract: Adrenal glands are unremarkable. Symmetric renal enhancement. No sign of hydronephrosis. No suspicious renal lesion or perinephric stranding. Urinary bladder is grossly unremarkable. urinary bladder is under distended limiting assessment. Mild cortical scarring of bilateral kidneys as before. Stomach/Bowel: No acute gastrointestinal process. Appendix is normal. Stool fills much of the colon without signs of significant distension of colon or small bowel. Vascular/Lymphatic: Aortic atherosclerosis. No sign of aneurysm. Smooth contour of the IVC. There is no gastrohepatic or hepatoduodenal ligament lymphadenopathy. No retroperitoneal or mesenteric lymphadenopathy. No pelvic sidewall lymphadenopathy. Reproductive: Post hysterectomy.  No adnexal masses. Other: No ascites.  No free air. Musculoskeletal: No acute bone finding. No destructive bone process. Spinal degenerative changes. Signs of lumbar spinal fusion and interbody cage placement extending from L3 through L5. IMPRESSION: Post RIGHT upper lobectomy. Diminished RIGHT-sided pleural effusion now with minimal pleural fluid in the RIGHT chest. No signs of recurrent or metastatic disease. Cholelithiasis without evidence of acute cholecystitis. Aortic atherosclerosis. Aortic Atherosclerosis (ICD10-I70.0). Electronically Signed   By: Zetta Bills M.D.   On: 06/11/2021 08:39   CT Abdomen Pelvis W Contrast  Result Date: 06/11/2021 CLINICAL DATA:  History of non-small cell lung  cancer, staging evaluation in a 71 year old female. EXAM: CT CHEST, ABDOMEN, AND PELVIS WITH CONTRAST TECHNIQUE: Multidetector CT imaging of the chest, abdomen and pelvis was performed following the standard protocol during bolus administration of intravenous contrast. CONTRAST:  79mL OMNIPAQUE IOHEXOL 350 MG/ML SOLN COMPARISON:  March 14, 2021. FINDINGS: CT CHEST FINDINGS Cardiovascular: Calcified and noncalcified atheromatous plaque in the thoracic aorta. Normal heart size. No substantial pericardial effusion. No aortic dilation. Normal caliber central pulmonary vessels. Post partial lung resection in the RIGHT chest with resultant distortion of the RIGHT hilum. Mediastinum/Nodes: No thoracic inlet, axillary, mediastinal or hilar adenopathy. Esophagus grossly normal. Lungs/Pleura: Diminished RIGHT-sided pleural effusion. Post RIGHT upper lobectomy. Stable pleural and parenchymal scarring related to postoperative changes. Airways are patent. Tiny pulmonary nodules in the LEFT chest in the 2-3 mm range in the LEFT upper lobe (image 79/4 and in the LEFT lower lobe (image 104/4, these are stable. Musculoskeletal: See below for full musculoskeletal details. CT ABDOMEN PELVIS FINDINGS Hepatobiliary: No focal, suspicious hepatic lesion. No pericholecystic stranding. No biliary duct dilation. Portal vein is patent. Cholelithiasis with proximally 1 cm gallstones 2-3 gallstones in the gallbladder. Pancreas: Normal, without mass, inflammation or ductal dilatation. Spleen: Spleen normal size and contour. Adrenals/Urinary Tract: Adrenal glands are unremarkable. Symmetric renal enhancement. No sign of hydronephrosis. No suspicious renal lesion or perinephric stranding. Urinary bladder is grossly unremarkable. urinary bladder is under distended limiting assessment. Mild cortical scarring of bilateral kidneys as before. Stomach/Bowel: No acute gastrointestinal process. Appendix is normal. Stool fills much of the colon without  signs of significant distension of colon or small bowel. Vascular/Lymphatic: Aortic atherosclerosis. No sign of aneurysm. Smooth contour of the IVC. There is no gastrohepatic or hepatoduodenal ligament lymphadenopathy. No retroperitoneal or mesenteric lymphadenopathy. No pelvic sidewall lymphadenopathy. Reproductive: Post hysterectomy.  No adnexal masses. Other: No ascites.  No free air. Musculoskeletal: No acute bone finding. No destructive bone process. Spinal degenerative changes. Signs of lumbar spinal fusion and interbody cage placement extending from L3 through L5. IMPRESSION: Post RIGHT upper lobectomy. Diminished RIGHT-sided pleural effusion now with minimal pleural fluid in the RIGHT chest. No signs of recurrent or metastatic disease. Cholelithiasis without evidence of acute cholecystitis. Aortic atherosclerosis. Aortic Atherosclerosis (ICD10-I70.0). Electronically Signed   By: Zetta Bills M.D.   On:  06/11/2021 08:39     ASSESSMENT AND PLAN: This is a very pleasant 71 years old African-American female diagnosed with a stage IV (T3, N0, M1b) non-small cell lung cancer, adenocarcinoma presented with right upper lobe lung mass with solitary brain metastasis diagnosed in February 2022 status post SRS to the brain lesion followed by craniotomy and resection. The patient also has a solitary lung mass. S/p robotic assisted right upper lobectomy with en bloc wedge resection of the right middle lobe and lymph node dissection under the care of Dr. Roxan Hockey on September 24, 2020. She also underwent SRS treatment to a new subcentimeter brain lesions under the care of Dr. Lisbeth Renshaw She is currently undergoing systemic chemotherapy with carboplatin for AUC of 5, Alimta 500 Mg/M2 and Keytruda 200 Mg IV every 3 weeks status post 10 cycles.  Starting from cycle #5 the patient will be on maintenance treatment with Alimta and Keytruda every 3 weeks. The patient continues to tolerate her systemic therapy fairly well with  no concerning adverse effects. I recommended for her to proceed with cycle #11 today as planned. For the vasogenic edema she is currently on Decadron 1 mg p.o. daily and she is followed by Dr. Mickeal Skinner. The patient will come back for follow-up visit in 3 weeks for evaluation before the next cycle of her treatment. She was advised to call immediately if she has any concerning symptoms in the interval.  The patient voices understanding of current disease status and treatment options and is in agreement with the current care plan.  All questions were answered. The patient knows to call the clinic with any problems, questions or concerns. We can certainly see the patient much sooner if necessary.  Disclaimer: This note was dictated with voice recognition software. Similar sounding words can inadvertently be transcribed and may not be corrected upon review.

## 2021-07-04 ENCOUNTER — Telehealth: Payer: Self-pay

## 2021-07-04 NOTE — Telephone Encounter (Signed)
Patient's daughter called to share that her mother is having increased trouble with "words not coming out as fast". Daughter  asking if Decadron 1 mg should be increased to 2 mg. Daughter stated that she has not observed any other issues,

## 2021-07-05 ENCOUNTER — Encounter: Payer: Self-pay | Admitting: Internal Medicine

## 2021-07-15 NOTE — Progress Notes (Signed)
Subjective:    Patient ID: Hannah [name], female    DOB: March 26, 1951, 71 y.o.   MRN: [id number]  This visit occurred during the SARS-CoV-2 public health emergency.  Safety protocols were in place, including screening questions prior to the visit, additional usage of staff PPE, and extensive cleaning of exam room while observing appropriate contact time as indicated for disinfecting solutions.    HPI The patient is here for an acute visit.  She is here with her daughter.   ? UTI:  Her symptoms started  several days ago.  She states dysuria, urinary frequency, urinary urgency. She did have some dizziness.     She denies hematuria, abdominal pain, back pain, nausea, fever.      Medications and allergies reviewed with patient and updated if appropriate.  Patient Active Problem List   Diagnosis Date Noted   Acute encephalopathy 04/22/2021   Vasogenic brain edema ([place]) 04/22/2021   Seizure ([place]) 04/22/2021   Nonrheumatic aortic valve insufficiency 04/18/2021   Sinusitis 02/21/2021   Allergic rhinitis 02/21/2021   Encounter for antineoplastic chemotherapy 10/23/2020   Encounter for antineoplastic immunotherapy 10/23/2020   Anemia 10/19/2020   Sleep difficulties 10/19/2020   S/P robot-assisted surgical procedure 09/24/2020   Aortic atherosclerosis ([place]) 09/12/2020   Lung mass 09/04/2020   Status post craniotomy 08/24/2020   Brain tumor ([place]) 08/24/2020   Hematochezia 08/19/2020   Acute blood loss anemia    Rectal bleeding 08/17/2020   Primary cancer of right upper lobe of lung ([place]) 08/07/2020   Brain metastasis ([place]) 08/06/2020   Memory changes 07/24/2020   Non-recurrent unilateral inguinal hernia without obstruction or gangrene 07/24/2020   Pain of right clavicle 09/12/2019   Nonspecific abnormal electrocardiogram (ECG) (EKG) 10/20/2018   Educated about COVID-19 virus infection 10/20/2018   Depression- Dr Casimiro Needle 07/17/2018   Primary osteoarthritis of right knee  06/04/2018   Bilateral carotid artery stenosis 09/24/2017   Aortic valve sclerosis 09/24/2017   Vocal cord polyps 09/07/2017   Dysphagia 09/07/2017   Diabetes mellitus without complication ([place]) 12/24/9483   GERD (gastroesophageal reflux disease) 02/27/2016   Anxiety - Dr Casimiro Needle 02/27/2016   S/P total knee replacement 02/13/2014   RBBB 02/08/2014   HTN (hypertension) 02/08/2014   PVD - bilateral 60-79% carotid strenosis 02/08/2014   Mixed hyperlipidemia 11/29/2013   Carpal tunnel syndrome, bilateral 11/23/2013   Murmur- mild -mod AR, mild MR 11/10/2013   Constipation 12/16/2012    Current Outpatient Medications on File Prior to Visit  Medication Sig Dispense Refill   acetaminophen (TYLENOL) 500 MG tablet Take 2 tablets (1,000 mg total) by mouth every 6 (six) hours. 30 tablet 0   ALPRAZolam (XANAX XR) 0.5 MG 24 hr tablet Take one tablet (0.5 mg total) by mouth every night at bedtime. 36 tablet 0   ALPRAZolam (XANAX) 0.5 MG tablet Take one tablet (0.5 mg total) by mouth qam and may take one more tablet (0.5 mg total) qd prn for anxiety. 120 tablet 5   Ascorbic Acid (VITAMIN C) 1000 MG tablet Take 1,000 mg by mouth daily.     aspirin 81 MG tablet Take 1 tablet (81 mg total) by mouth daily. Restart on 08/31/20 30 tablet    bisacodyl (DULCOLAX) 5 MG EC tablet Take 5 mg by mouth daily as needed for moderate constipation.     carboxymethylcellulose (REFRESH PLUS) 0.5 % SOLN Place 1-2 drops into both eyes 3 (three) times daily as needed (dry eyes).     Cholecalciferol (D3  PO) Take 1 capsule by mouth daily.     citalopram (CELEXA) 10 MG tablet Take 1 tablet (10 mg total) by mouth daily. 30 tablet 1   dexamethasone (DECADRON) 1 MG tablet Take 1 tablet (1 mg total) by mouth daily. 30 tablet 3   folic acid (FOLVITE) 1 MG tablet Take 1 tablet (1 mg total) by mouth daily. 30 tablet 2   Lacosamide 100 MG TABS Take 1 tablet (100 mg total) by mouth 2 (two) times daily. 60 tablet 3   levETIRAcetam  (KEPPRA) 100 MG/ML solution Take 15 mLs (1,500 mg total) by mouth 2 (two) times daily. 473 mL 6   loratadine (CLARITIN) 10 MG tablet Take 10 mg by mouth daily as needed for allergies.     Menthol, Topical Analgesic, (BIOFREEZE EX) Apply 1 application topically as needed (pain).     mirtazapine (REMERON) 45 MG tablet Take 1 tablet (45 mg total) by mouth at bedtime. 30 tablet 3   olopatadine (PATANOL) 0.1 % ophthalmic solution Place 1 drop into both eyes 2 (two) times daily as needed for allergies.     omeprazole (PRILOSEC) 40 MG capsule TAKE 1 CAPSULE (40 MG TOTAL) BY MOUTH 2 (TWO) TIMES DAILY BEFORE A MEAL. (Patient taking differently: Take 40 mg by mouth in the morning and at bedtime.) 180 capsule 0   polyethylene glycol (MIRALAX / GLYCOLAX) 17 g packet Take 17 g by mouth 2 (two) times daily. 72 each 0   pravastatin (PRAVACHOL) 40 MG tablet TAKE 1 TABLET (40 MG TOTAL) BY MOUTH EVERY EVENING. 90 tablet 0   Probiotic Product (PROBIOTIC DAILY PO) Take 1 capsule by mouth daily.     prochlorperazine (COMPAZINE) 10 MG tablet Take 1 tablet (10 mg total) by mouth every 6 (six) hours as needed for nausea or vomiting. 30 tablet 0   simethicone (MYLICON) 80 MG chewable tablet Chew 1 tablet (80 mg total) by mouth 4 (four) times daily as needed for flatulence (Bloating). 30 tablet 0   No current facility-administered medications on file prior to visit.    Past Medical History:  Diagnosis Date   Allergy    Anemia    Anxiety    Arthritis    Carpal tunnel syndrome    Constipation    senna C stool softeners help    Depression    Diverticulosis    Dyslipidemia    External hemorrhoids    GERD (gastroesophageal reflux disease)    Heart murmur    mild-moderate AR   Hiatal hernia    Hyperlipidemia    on meds    Hypertension    Internal hemorrhoids    lung ca with brain mets 07/2020   Osteoarthritis    Pre-diabetes    PVD (peripheral vascular disease) (HCC)    moderate carotid disease   RBBB     Smoker    Vocal cord polyps     Past Surgical History:  Procedure Laterality Date   ABDOMINAL HYSTERECTOMY  1950   APPLICATION OF CRANIAL NAVIGATION N/A 08/24/2020   Procedure: APPLICATION OF CRANIAL NAVIGATION;  Surgeon: Judith Part, MD;  Location: [facility];  Service: Neurosurgery;  Laterality: N/A;   COLONOSCOPY     COLONOSCOPY W/ POLYPECTOMY  2009   CRANIOTOMY Left 08/24/2020   Procedure: Left Craniotomy for Tumor Resection with Brainlab;  Surgeon: Judith Part, MD;  Location: [facility];  Service: Neurosurgery;  Laterality: Left;   HEMORRHOID SURGERY     INTERCOSTAL NERVE BLOCK Right 09/24/2020  Procedure: INTERCOSTAL NERVE BLOCK;  Surgeon: Melrose Nakayama, MD;  Location: [facility];  Service: Thoracic;  Laterality: Right;   JOINT REPLACEMENT     LYMPH NODE DISSECTION Right 09/24/2020   Procedure: LYMPH NODE DISSECTION;  Surgeon: Melrose Nakayama, MD;  Location: [facility];  Service: Thoracic;  Laterality: Right;   POLYPECTOMY     right total knee arthroplasty     Dr. Emeterio Reeve 06-04-18   [place]  08/16/2009   TOTAL KNEE ARTHROPLASTY Left 02/13/2014   Procedure: TOTAL KNEE ARTHROPLASTY;  Surgeon: Alta Corning, MD;  Location: [facility];  Service: Orthopedics;  Laterality: Left;   TOTAL KNEE ARTHROPLASTY Right 06/04/2018   Procedure: RIGHT TOTAL KNEE ARTHROPLASTY;  Surgeon: Dorna Leitz, MD;  Location: [facility];  Service: Orthopedics;  Laterality: Right;  Adductor Block   TUBAL LIGATION      Social History   Socioeconomic History   Marital status: Married    Spouse name: Not on file   Number of children: 2   Years of education: Not on file   Highest education level: Not on file  Occupational History   Not on file  Tobacco Use   Smoking status: Former    Packs/day: 0.25    Years: 40.00    Pack years: 10.00    Types: Cigarettes    Quit date: 07/09/2020    Years since quitting: 1.0    Passive exposure: Never   Smokeless tobacco: Never   Tobacco comments:    PACK WILL  LAST 3 days  Vaping Use   Vaping Use: Never used  Substance and Sexual Activity   Alcohol use: No   Drug use: No   Sexual activity: Not Currently  Other Topics Concern   Not on file  Social History Narrative   Not on file   Social Determinants of Health   Financial Resource Strain: Low Risk    Difficulty of Paying Living Expenses: Not hard at all  Food Insecurity: No Food Insecurity   Worried About Charity fundraiser in the Last Year: Never true   [place] in the Last Year: Never true  Transportation Needs: No Transportation Needs   Lack of Transportation (Medical): No   Lack of Transportation (Non-Medical): No  Physical Activity: Sufficiently Active   Days of Exercise per Week: 3 days   Minutes of Exercise per Session: 60 min  Stress: No Stress Concern Present   Feeling of Stress : Not at all  Social Connections: Socially Integrated   Frequency of Communication with Friends and Family: More than three times a week   Frequency of Social Gatherings with Friends and Family: More than three times a week   Attends Religious Services: More than 4 times per year   Active Member of [employer] or Organizations: Yes   Attends Music therapist: More than 4 times per year   Marital Status: Married    Family History  Problem Relation Age of Onset   Cancer Father    Prostate cancer Father    Diabetes Sister    Cancer Sister    Stroke Maternal Grandfather    Colitis Maternal Aunt    Breast cancer Maternal Aunt    Colon cancer Neg Hx    Colon polyps Neg Hx    Rectal cancer Neg Hx    Stomach cancer Neg Hx    Esophageal cancer Neg Hx     Review of Systems     Objective:  Vitals:   07/16/21 1550  BP: (!) 120/58  Pulse: 68  Temp: 98.6 F (37 C)  SpO2: 97%   BP Readings from Last 3 Encounters:  07/16/21 (!) 120/58  07/03/21 129/68  06/12/21 (!) 152/77   Wt Readings from Last 3 Encounters:  07/16/21 127 lb (57.6 kg)  07/03/21 123 lb 9.6 oz (56.1 kg)   06/12/21 121 lb 4.8 oz (55 kg)   Body mass index is 24 kg/m.   Physical Exam    Constitutional:      General: She is not in acute distress.    Appearance: Normal appearance. She is not ill-appearing.  HENT:     Head: Normocephalic and atraumatic.  Abdominal:     General: There is no distension.     Palpations: Abdomen is soft.     Tenderness: There is no abdominal tenderness. There is no right CVA tenderness, left CVA tenderness, guarding or rebound.  Skin:    General: Skin is warm and dry.  Neurological:     Mental Status: She is alert.       Assessment & Plan:    See Problem List for Assessment and Plan of chronic medical problems.

## 2021-07-16 ENCOUNTER — Ambulatory Visit (INDEPENDENT_AMBULATORY_CARE_PROVIDER_SITE_OTHER): Payer: Medicare HMO | Admitting: Internal Medicine

## 2021-07-16 ENCOUNTER — Other Ambulatory Visit: Payer: Self-pay | Admitting: Radiation Therapy

## 2021-07-16 ENCOUNTER — Encounter: Payer: Self-pay | Admitting: Internal Medicine

## 2021-07-16 ENCOUNTER — Other Ambulatory Visit: Payer: Self-pay

## 2021-07-16 VITALS — BP 120/58 | HR 68 | Temp 98.6°F | Ht 61.0 in | Wt 127.0 lb

## 2021-07-16 DIAGNOSIS — R300 Dysuria: Secondary | ICD-10-CM

## 2021-07-16 DIAGNOSIS — N3000 Acute cystitis without hematuria: Secondary | ICD-10-CM | POA: Insufficient documentation

## 2021-07-16 DIAGNOSIS — N3001 Acute cystitis with hematuria: Secondary | ICD-10-CM | POA: Diagnosis not present

## 2021-07-16 LAB — POC URINALSYSI DIPSTICK (AUTOMATED)
Bilirubin, UA: NEGATIVE
Glucose, UA: NEGATIVE
Ketones, UA: NEGATIVE
Nitrite, UA: POSITIVE
Protein, UA: POSITIVE — AB
Spec Grav, UA: 1.025 (ref 1.010–1.025)
Urobilinogen, UA: 0.2 E.U./dL
pH, UA: 6 (ref 5.0–8.0)

## 2021-07-16 MED ORDER — NITROFURANTOIN 25 MG/5ML PO SUSP: 100.0000 mg | Freq: Two times a day (BID) | ORAL | 0 refills | Status: DC

## 2021-07-16 MED ORDER — FOSFOMYCIN TROMETHAMINE 3 G PO PACK: 3.0000 g | PACK | Freq: Once | ORAL | 0 refills | Status: AC

## 2021-07-16 NOTE — Assessment & Plan Note (Signed)
Acute Urine dip consistent with UTI Will send urine for culture Take the antibiotic as prescribed.  Fosfomycin 3 g p.o. x1-she does not do well with taking pills Take tylenol if needed.   Increase your water intake. Advised that if her symptoms do not completely resolve we will need to recheck her urine Call if no improvement

## 2021-07-16 NOTE — Patient Instructions (Signed)
Take the antibiotic as prescribed.  Take tylenol if needed.     Increase your water intake.   Call if no improvement     Urinary Tract Infection, Adult A urinary tract infection (UTI) is an infection of any part of the urinary tract, which includes the kidneys, ureters, bladder, and urethra. These organs make, store, and get rid of urine in the body. UTI can be a bladder infection (cystitis) or kidney infection (pyelonephritis). What are the causes? This infection may be caused by fungi, viruses, or bacteria. Bacteria are the most common cause of UTIs. This condition can also be caused by repeated incomplete emptying of the bladder during urination. What increases the risk? This condition is more likely to develop if:  You ignore your need to urinate or hold urine for long periods of time.  You do not empty your bladder completely during urination.  You wipe back to front after urinating or having a bowel movement, if you are female.  You are uncircumcised, if you are female.  You are constipated.  You have a urinary catheter that stays in place (indwelling).  You have a weak defense (immune) system.  You have a medical condition that affects your bowels, kidneys, or bladder.  You have diabetes.  You take antibiotic medicines frequently or for long periods of time, and the antibiotics no longer work well against certain types of infections (antibiotic resistance).  You take medicines that irritate your urinary tract.  You are exposed to chemicals that irritate your urinary tract.  You are female.  What are the signs or symptoms? Symptoms of this condition include:  Fever.  Frequent urination or passing small amounts of urine frequently.  Needing to urinate urgently.  Pain or burning with urination.  Urine that smells bad or unusual.  Cloudy urine.  Pain in the lower abdomen or back.  Trouble urinating.  Blood in the urine.  Vomiting or being less hungry than  normal.  Diarrhea or abdominal pain.  Vaginal discharge, if you are female.  How is this diagnosed? This condition is diagnosed with a medical history and physical exam. You will also need to provide a urine sample to test your urine. Other tests may be done, including:  Blood tests.  Sexually transmitted disease (STD) testing.  If you have had more than one UTI, a cystoscopy or imaging studies may be done to determine the cause of the infections. How is this treated? Treatment for this condition often includes a combination of two or more of the following:  Antibiotic medicine.  Other medicines to treat less common causes of UTI.  Over-the-counter medicines to treat pain.  Drinking enough water to stay hydrated.  Follow these instructions at home:  Take over-the-counter and prescription medicines only as told by your health care provider.  If you were prescribed an antibiotic, take it as told by your health care provider. Do not stop taking the antibiotic even if you start to feel better.  Avoid alcohol, caffeine, tea, and carbonated beverages. They can irritate your bladder.  Drink enough fluid to keep your urine clear or pale yellow.  Keep all follow-up visits as told by your health care provider. This is important.  Make sure to: ? Empty your bladder often and completely. Do not hold urine for long periods of time. ? Empty your bladder before and after sex. ? Wipe from front to back after a bowel movement if you are female. Use each tissue one time when you   wipe. Contact a health care provider if:  You have back pain.  You have a fever.  You feel nauseous or vomit.  Your symptoms do not get better after 3 days.  Your symptoms go away and then return. Get help right away if:  You have severe back pain or lower abdominal pain.  You are vomiting and cannot keep down any medicines or water. This information is not intended to replace advice given to you by  your health care provider. Make sure you discuss any questions you have with your health care provider. Document Released: 03/26/2005 Document Revised: 11/28/2015 Document Reviewed: 05/07/2015 Elsevier Interactive Patient Education  2018 Elsevier Inc.   

## 2021-07-17 ENCOUNTER — Ambulatory Visit: Payer: Medicare HMO | Admitting: Internal Medicine

## 2021-07-17 ENCOUNTER — Encounter: Payer: Self-pay | Admitting: Internal Medicine

## 2021-07-17 ENCOUNTER — Telehealth: Payer: Self-pay

## 2021-07-17 MED ORDER — NITROFURANTOIN 25 MG/5ML PO SUSP: 100.0000 mg | Freq: Two times a day (BID) | ORAL | 0 refills | Status: DC

## 2021-07-17 NOTE — Telephone Encounter (Signed)
Hannah [name] (Key: [id number])  Your information has been sent to [facility].  Hannah [name] (Key: [id number])  Your information has been submitted to [facility]. [employer] will review the request and will issue a decision, typically within 3-7 days from your submission. You can check the updated outcome later by reopening this request.  If [employer] has not responded in 3-7 days or if you have any questions about your ePA request, please contact [employer] at [phone]. If you think there may be a problem with your PA request, use our live chat feature at the bottom right.  For [place] requests, please call [phone].

## 2021-07-17 NOTE — Telephone Encounter (Signed)
Spoke with daughter and script has been approved.  She will let me know if she has any problems picking it up.

## 2021-07-17 NOTE — Telephone Encounter (Signed)
[name] Key: [id number] - PA Case ID: [id number] - Rx #: [id number] Need help? Call us at [phone] Status Sent to [facility] Nitrofurantoin 25MG /5ML suspension Form [employer] Electronic [place] 569,[address] Health Plans Preferred [place] [phone] SULFAMETHOXAZOLE-TRIMETHOPRIM [phone]

## 2021-07-17 NOTE — Telephone Encounter (Signed)
The liquid medication we discussed first was sent to [facility].  Call if they do not have this.

## 2021-07-17 NOTE — Telephone Encounter (Signed)
[name] Key: [id number] - PA Case ID: [id number] - Rx #: [id number] Need help? Call us at [phone] Outcome [employer] PA Case: [id number], Status: Approved, Coverage Starts on: 06/30/2021 12:00:00 AM, Coverage Ends on: 06/29/2022 12:00:00 AM. Questions? Contact [phone]. Drug Nitrofurantoin 25MG /5ML suspension Form [employer] Electronic PA Form Original Claim Info 569,MR Health Plans Preferred [place] [phone] SULFAMETHOXAZOLE-TRIMETHOPR

## 2021-07-17 NOTE — Telephone Encounter (Signed)
Pt daughter calling to request a different medication from : fosfomycin (MONUROL) 3 g PACK as insurance will not cover this medication.  Please update [name] at: [phone] with any changes  Pharmacy: [facility], [place]

## 2021-07-18 ENCOUNTER — Other Ambulatory Visit (HOSPITAL_COMMUNITY): Payer: Self-pay | Admitting: *Deleted

## 2021-07-18 ENCOUNTER — Ambulatory Visit
Admission: RE | Admit: 2021-07-18 | Discharge: 2021-07-18 | Disposition: A | Discharge status: Home / Self Care | Payer: Medicare HMO | Source: Ambulatory Visit | Attending: Internal Medicine | Admitting: Internal Medicine

## 2021-07-18 ENCOUNTER — Other Ambulatory Visit: Payer: Self-pay

## 2021-07-18 ENCOUNTER — Telehealth: Payer: Medicare HMO

## 2021-07-18 DIAGNOSIS — G936 Cerebral edema: Secondary | ICD-10-CM | POA: Diagnosis not present

## 2021-07-18 DIAGNOSIS — G9389 Other specified disorders of brain: Secondary | ICD-10-CM | POA: Diagnosis not present

## 2021-07-18 DIAGNOSIS — C7931 Secondary malignant neoplasm of brain: Secondary | ICD-10-CM

## 2021-07-18 LAB — URINE CULTURE

## 2021-07-18 MED ORDER — ALPRAZOLAM ER 0.5 MG PO TB24: ORAL_TABLET | ORAL | 0 refills | Status: DC

## 2021-07-18 MED ORDER — CEPHALEXIN 500 MG PO CAPS: 500.0000 mg | ORAL_CAPSULE | Freq: Two times a day (BID) | ORAL | 0 refills | Status: DC

## 2021-07-18 MED ORDER — GADOBENATE DIMEGLUMINE 529 MG/ML IV SOLN
11.0000 mL | Freq: Once | INTRAVENOUS | Status: AC | PRN
  Administered 2021-07-18: 11 mL via INTRAVENOUS

## 2021-07-18 NOTE — Telephone Encounter (Signed)
Called and spoke with pharmacist. Script was approved but cost of medication was $95.00. However the Keflex was covered.  Daughter informed that new script was sent in and ready for pick up.

## 2021-07-18 NOTE — Addendum Note (Signed)
Addended by: [name] on: 07/18/2021 10:43 AM   Modules accepted: Orders

## 2021-07-18 NOTE — Telephone Encounter (Signed)
sent 

## 2021-07-18 NOTE — Telephone Encounter (Signed)
Pt daughter calling back in stating that the Medication nitrofurantoin (FURADANTIN) 25 MG/5ML suspension is still not covered once she went to pick it up.  Pt had been prescribed cephALEXin (KEFLEX) 500 MG capsule the last time this occur and it was covered by insurance is it a possiblilty that she can get this medicaiton again.  Please update Hannah [name] at [phone]

## 2021-07-19 ENCOUNTER — Telehealth: Payer: Self-pay

## 2021-07-19 NOTE — Telephone Encounter (Signed)
Pt's daughter called asking for results of MRI completed on 07/18/21. Encouraged patient that Dr. Mickeal Skinner will review results with pt/daughter at apt on 07/23/21 after Tumor Board meets on 07/22/21. Understanding voiced.

## 2021-07-22 ENCOUNTER — Inpatient Hospital Stay: Payer: Medicare HMO

## 2021-07-23 ENCOUNTER — Inpatient Hospital Stay: Payer: Medicare HMO | Admitting: Internal Medicine

## 2021-07-23 ENCOUNTER — Other Ambulatory Visit: Payer: Self-pay

## 2021-07-23 VITALS — BP 133/70 | HR 90 | Temp 98.4°F | Resp 18 | Wt 130.8 lb

## 2021-07-23 DIAGNOSIS — Z5112 Encounter for antineoplastic immunotherapy: Secondary | ICD-10-CM | POA: Diagnosis not present

## 2021-07-23 DIAGNOSIS — I10 Essential (primary) hypertension: Secondary | ICD-10-CM | POA: Diagnosis not present

## 2021-07-23 DIAGNOSIS — Z5111 Encounter for antineoplastic chemotherapy: Secondary | ICD-10-CM | POA: Diagnosis not present

## 2021-07-23 DIAGNOSIS — C7931 Secondary malignant neoplasm of brain: Secondary | ICD-10-CM | POA: Diagnosis not present

## 2021-07-23 DIAGNOSIS — K5900 Constipation, unspecified: Secondary | ICD-10-CM | POA: Diagnosis not present

## 2021-07-23 DIAGNOSIS — E785 Hyperlipidemia, unspecified: Secondary | ICD-10-CM | POA: Diagnosis not present

## 2021-07-23 DIAGNOSIS — R569 Unspecified convulsions: Secondary | ICD-10-CM

## 2021-07-23 DIAGNOSIS — F32A Depression, unspecified: Secondary | ICD-10-CM | POA: Diagnosis not present

## 2021-07-23 DIAGNOSIS — C3411 Malignant neoplasm of upper lobe, right bronchus or lung: Secondary | ICD-10-CM | POA: Diagnosis not present

## 2021-07-23 DIAGNOSIS — K219 Gastro-esophageal reflux disease without esophagitis: Secondary | ICD-10-CM | POA: Diagnosis not present

## 2021-07-23 MED ORDER — DEXAMETHASONE 1 MG PO TABS: 1.0000 mg | ORAL_TABLET | Freq: Every day | ORAL | 1 refills | Status: DC

## 2021-07-23 NOTE — Progress Notes (Signed)
[facility] [place], [place] [id number] [phone]   Interval Evaluation  Date of Service: 07/23/21 Patient Name: Hannah [name] Patient MRN: [id number] Patient DOB: [birth year] Provider: Ventura Sellers, MD  Identifying Statement:  SHAAKIRA BORRERO is a 71 y.o. female with brain metastasis   Primary Cancer:  Oncology History  Primary cancer of right upper lobe of lung ([place])  08/07/2020 Initial Diagnosis   Primary cancer of right upper lobe of lung ([place])   09/20/2020 Cancer Staging   Staging form: Lung, AJCC 8th Edition - Clinical: Stage IVB (cT3, cN0, cM1c) - Signed by Curt Bears, MD on 09/20/2020    11/08/2020 -  Chemotherapy   Patient is on Treatment Plan : LUNG CARBOplatin / Pemetrexed / Pembrolizumab q21d Induction x 4 cycles / Maintenance Pemetrexed + Pembrolizumab      Oncologic History 08/23/20: Pre-op SRS to L frontal mass Tammi Klippel) 08/24/20: Craniotomy, resection ([facility]) 11/29/20: Salvage SRS x2 Tammi Klippel)  Interval History:  Hannah [name] presents today for follow up after recent MRI brain.  Speech and memory have improved since antibiotic treatment for recent UTI.  Right sided weakness has not returned.  She continues on keppra and vimpat without frank seizures.  Decadron is again at 2mg  daily.  Continues on Pem/Pem with Dr. Julien Nordmann, next infusion tomorrow.  H+P (08/02/20) Patient presented to medical attention this past week with several days rapidly progressive speech impairment and right sided weakness.  She and her family describe difficulty putting complete sentences together, impaired use of right arm (for using fork, zipper, brushing teeth), and dragging of the right leg while walking.  She never needed assistance to walk.  CNS imaging demonstrated left frontal mass, and decadron was started over the weekend 4mg  twice per day.  She feels improved with regards to the right sided weakness with the  steroids.  Otherwise no seizures, headaches.  Medications: Current Outpatient Medications on File Prior to Visit  Medication Sig Dispense Refill   acetaminophen (TYLENOL) 500 MG tablet Take 2 tablets (1,000 mg total) by mouth every 6 (six) hours. 30 tablet 0   ALPRAZolam (XANAX XR) 0.5 MG 24 hr tablet Take one tablet (0.5 mg total) by mouth every night at bedtime. 10 tablet 0   ALPRAZolam (XANAX) 0.5 MG tablet Take one tablet (0.5 mg total) by mouth qam and may take one more tablet (0.5 mg total) qd prn for anxiety. 120 tablet 5   Ascorbic Acid (VITAMIN C) 1000 MG tablet Take 1,000 mg by mouth daily.     aspirin 81 MG tablet Take 1 tablet (81 mg total) by mouth daily. Restart on 08/31/20 30 tablet    carboxymethylcellulose (REFRESH PLUS) 0.5 % SOLN Place 1-2 drops into both eyes 3 (three) times daily as needed (dry eyes).     cephALEXin (KEFLEX) 500 MG capsule Take 1 capsule (500 mg total) by mouth 2 (two) times daily. 14 capsule 0   Cholecalciferol (D3 PO) Take 1 capsule by mouth daily.     citalopram (CELEXA) 10 MG tablet Take 1 tablet (10 mg total) by mouth daily. 30 tablet 1   folic acid (FOLVITE) 1 MG tablet Take 1 tablet (1 mg total) by mouth daily. 30 tablet 2   Lacosamide 100 MG TABS Take 1 tablet (100 mg total) by mouth 2 (two) times daily. 60 tablet 3   levETIRAcetam (KEPPRA) 100 MG/ML solution Take 15 mLs (1,500 mg total) by mouth 2 (two)  times daily. 473 mL 6   mirtazapine (REMERON) 45 MG tablet Take 1 tablet (45 mg total) by mouth at bedtime. 30 tablet 3   omeprazole (PRILOSEC) 40 MG capsule TAKE 1 CAPSULE (40 MG TOTAL) BY MOUTH 2 (TWO) TIMES DAILY BEFORE A MEAL. (Patient taking differently: Take 40 mg by mouth in the morning and at bedtime.) 180 capsule 0   polyethylene glycol (MIRALAX / GLYCOLAX) 17 g packet Take 17 g by mouth 2 (two) times daily. 72 each 0   pravastatin (PRAVACHOL) 40 MG tablet TAKE 1 TABLET (40 MG TOTAL) BY MOUTH EVERY EVENING. 90 tablet 0   Probiotic Product  (PROBIOTIC DAILY PO) Take 1 capsule by mouth daily.     simethicone (MYLICON) 80 MG chewable tablet Chew 1 tablet (80 mg total) by mouth 4 (four) times daily as needed for flatulence (Bloating). 30 tablet 0   bisacodyl (DULCOLAX) 5 MG EC tablet Take 5 mg by mouth daily as needed for moderate constipation. (Patient not taking: Reported on 07/23/2021)     loratadine (CLARITIN) 10 MG tablet Take 10 mg by mouth daily as needed for allergies. (Patient not taking: Reported on 07/23/2021)     Menthol, Topical Analgesic, (BIOFREEZE EX) Apply 1 application topically as needed (pain). (Patient not taking: Reported on 07/23/2021)     olopatadine (PATANOL) 0.1 % ophthalmic solution Place 1 drop into both eyes 2 (two) times daily as needed for allergies. (Patient not taking: Reported on 07/23/2021)     prochlorperazine (COMPAZINE) 10 MG tablet Take 1 tablet (10 mg total) by mouth every 6 (six) hours as needed for nausea or vomiting. (Patient not taking: Reported on 07/23/2021) 30 tablet 0   No current facility-administered medications on file prior to visit.    Allergies:  Allergies  Allergen Reactions   Amlodipine Swelling and Rash    Rash, swelling   Chantix [Varenicline Tartrate] Shortness Of Breath, Swelling and Other (See Comments)    Tongue swell,sob   Clarithromycin Rash   Lisinopril Hives   Simvastatin Hives   Wellbutrin [Bupropion Hcl] Hives   Lipitor [Atorvastatin]     Dizziness per patient   Sertraline     Makes her feel like she is going to kill someone   Past Medical History:  Past Medical History:  Diagnosis Date   Allergy    Anemia    Anxiety    Arthritis    Carpal tunnel syndrome    Constipation    senna C stool softeners help    Depression    Diverticulosis    Dyslipidemia    External hemorrhoids    GERD (gastroesophageal reflux disease)    Heart murmur    mild-moderate AR   Hiatal hernia    Hyperlipidemia    on meds    Hypertension    Internal hemorrhoids    lung ca  with brain mets 07/2020   Osteoarthritis    Pre-diabetes    PVD (peripheral vascular disease) (HCC)    moderate carotid disease   RBBB    Smoker    Vocal cord polyps    Past Surgical History:  Past Surgical History:  Procedure Laterality Date   ABDOMINAL HYSTERECTOMY  7829   APPLICATION OF CRANIAL NAVIGATION N/A 08/24/2020   Procedure: APPLICATION OF CRANIAL NAVIGATION;  Surgeon: Judith Part, MD;  Location: [facility];  Service: Neurosurgery;  Laterality: N/A;   COLONOSCOPY     COLONOSCOPY W/ POLYPECTOMY  2009   CRANIOTOMY Left 08/24/2020   Procedure: Left  Craniotomy for Tumor Resection with Brainlab;  Surgeon: Judith Part, MD;  Location: [facility];  Service: Neurosurgery;  Laterality: Left;   HEMORRHOID SURGERY     INTERCOSTAL NERVE BLOCK Right 09/24/2020   Procedure: INTERCOSTAL NERVE BLOCK;  Surgeon: Melrose Nakayama, MD;  Location: [facility];  Service: Thoracic;  Laterality: Right;   JOINT REPLACEMENT     LYMPH NODE DISSECTION Right 09/24/2020   Procedure: LYMPH NODE DISSECTION;  Surgeon: Melrose Nakayama, MD;  Location: [facility];  Service: Thoracic;  Laterality: Right;   POLYPECTOMY     right total knee arthroplasty     Dr. Emeterio Reeve 06-04-18   [facility]  08/16/2009   TOTAL KNEE ARTHROPLASTY Left 02/13/2014   Procedure: TOTAL KNEE ARTHROPLASTY;  Surgeon: Alta Corning, MD;  Location: [facility];  Service: Orthopedics;  Laterality: Left;   TOTAL KNEE ARTHROPLASTY Right 06/04/2018   Procedure: RIGHT TOTAL KNEE ARTHROPLASTY;  Surgeon: Dorna Leitz, MD;  Location: [facility];  Service: Orthopedics;  Laterality: Right;  Adductor Block   TUBAL LIGATION     Social History:  Social History   Socioeconomic History   Marital status: Married    Spouse name: Not on file   Number of children: 2   Years of education: Not on file   Highest education level: Not on file  Occupational History   Not on file  Tobacco Use   Smoking status: Former    Packs/day: 0.25    Years: 40.00     Pack years: 10.00    Types: Cigarettes    Quit date: 07/09/2020    Years since quitting: 1.0    Passive exposure: Never   Smokeless tobacco: Never   Tobacco comments:    PACK WILL LAST 3 days  Vaping Use   Vaping Use: Never used  Substance and Sexual Activity   Alcohol use: No   Drug use: No   Sexual activity: Not Currently  Other Topics Concern   Not on file  Social History Narrative   Not on file   Social Determinants of Health   Financial Resource Strain: Low Risk    Difficulty of Paying Living Expenses: Not hard at all  Food Insecurity: No Food Insecurity   Worried About Charity fundraiser in the Last Year: Never true   [place] in the Last Year: Never true  Transportation Needs: No Transportation Needs   Lack of Transportation (Medical): No   Lack of Transportation (Non-Medical): No  Physical Activity: Sufficiently Active   Days of Exercise per Week: 3 days   Minutes of Exercise per Session: 60 min  Stress: No Stress Concern Present   Feeling of Stress : Not at all  Social Connections: Socially Integrated   Frequency of Communication with Friends and Family: More than three times a week   Frequency of Social Gatherings with Friends and Family: More than three times a week   Attends Religious Services: More than 4 times per year   Active Member of [employer] or Organizations: Yes   Attends Music therapist: More than 4 times per year   Marital Status: Married  Human resources officer Violence: Not At Risk   Fear of Current or Ex-Partner: No   Emotionally Abused: No   Physically Abused: No   Sexually Abused: No   Family History:  Family History  Problem Relation Age of Onset   Cancer Father    Prostate cancer Father    Diabetes Sister  Cancer Sister    Stroke Maternal Grandfather    Colitis Maternal Aunt    Breast cancer Maternal Aunt    Colon cancer Neg Hx    Colon polyps Neg Hx    Rectal cancer Neg Hx    Stomach cancer Neg Hx    Esophageal  cancer Neg Hx     Review of Systems: Constitutional: Doesn't report fevers, chills or abnormal weight loss Eyes: Doesn't report blurriness of vision Ears, nose, mouth, throat, and face: Doesn't report sore throat Respiratory: Doesn't report cough, dyspnea or wheezes Cardiovascular: Doesn't report palpitation, chest discomfort  Gastrointestinal:  Doesn't report nausea, constipation, diarrhea GU: Doesn't report incontinence Skin: Doesn't report skin rashes Neurological: Per HPI Musculoskeletal: Doesn't report joint pain Behavioral/Psych: ++anxiety  Physical Exam: Vitals:   07/23/21 1441  BP: 133/70  Pulse: 90  Resp: 18  Temp: 98.4 F (36.9 C)  SpO2: 97%   KPS: 80. General: Alert, cooperative, pleasant, in no acute distress Head: Normal EENT: No conjunctival injection or scleral icterus.  Lungs: Resp effort normal Cardiac: Regular rate Abdomen: Non-distended abdomen Skin: No rashes cyanosis or petechiae. Extremities: No clubbing or edema  Neurologic Exam: Mental Status: Awake, alert, attentive to examiner. Oriented to self and environment. Language is intact with regards to fluency, comprehension.  Psychomotor slowing, impaired recall. Cranial Nerves: Visual acuity is grossly normal. Visual fields are full. Extra-ocular movements intact. No ptosis. Face is symmetric Motor: Tone and bulk are normal. Power is 5/5 throughout. Reflexes are symmetric, no pathologic reflexes present.  Sensory: Intact to light touch Gait: Independent  Labs: I have reviewed the data as listed    Component Value Date/Time   NA 140 07/03/2021 1015   K 3.6 07/03/2021 1015   CL 104 07/03/2021 1015   CO2 29 07/03/2021 1015   GLUCOSE 114 (H) 07/03/2021 1015   BUN 15 07/03/2021 1015   CREATININE 0.78 07/03/2021 1015   CREATININE 0.79 03/14/2020 1016   CALCIUM 9.9 07/03/2021 1015   PROT 7.5 07/03/2021 1015   ALBUMIN 4.0 07/03/2021 1015   AST 26 07/03/2021 1015   ALT 23 07/03/2021 1015    ALKPHOS 59 07/03/2021 1015   BILITOT 0.2 (L) 07/03/2021 1015   GFRNONAA >60 07/03/2021 1015   GFRNONAA 75 03/14/2020 1016   GFRAA 87 03/14/2020 1016   Lab Results  Component Value Date   WBC 7.2 07/03/2021   NEUTROABS 3.7 07/03/2021   HGB 11.2 (L) 07/03/2021   HCT 36.1 07/03/2021   MCV 89.1 07/03/2021   PLT 282 07/03/2021    Imaging:  [facility] Clinician Interpretation: I have personally reviewed the CNS images as listed.  My interpretation, in the context of the patient's clinical presentation, is likely treatment effect  MR BRAIN W WO CONTRAST  Result Date: 07/19/2021 CLINICAL DATA:  71 year old female with metastatic lung cancer status post resection in February 2022 and subsequent SRS to 2 additional small metastases. Increasing edema and abnormal enhancement in October. Restaging. EXAM: MRI HEAD WITHOUT AND WITH CONTRAST TECHNIQUE: Multiplanar, multiecho pulse sequences of the brain and surrounding structures were obtained without and with intravenous contrast. CONTRAST:  37mL MULTIHANCE GADOBENATE DIMEGLUMINE 529 MG/ML IV SOLN COMPARISON:  05/31/2021 and earlier. FINDINGS: Brain: Left superior frontal gyrus resection site patchy enhancement somewhat contiguous with the dura is mildly regressed since October, in an area of 14 x 18 mm now (previously 16 x 18). Regional FLAIR hyperintensity has also regressed with residual on series 7, image 45. This area now has a similar  appearance to August of last year. No significant regional mass effect. Abundant hemosiderin here is stable. Stellate enhancing left occipital lesion remains significantly enlarged compared to August, about 15 mm diameter now, versus 10-11 mm in December. Regional FLAIR hyperintensity continues to enlarge. See series 7, image 28. Only mild regional mass effect on the left occipital horn. No new areas of abnormal enhancement identified. No superimposed restricted diffusion to suggest acute infarction. No midline shift,  ventriculomegaly, scattered moderate for age nonspecific cerebral white matter T2 and FLAIR hyperintensity elsewhere is stable. Or acute intracranial hemorrhage. Cervicomedullary junction and pituitary are within normal limits. Vascular: Major intracranial vascular flow voids are stable. The major dural venous sinuses are enhancing and appear to be patent. Skull and upper cervical spine: Negative visible spinal cord. Stable visible bone marrow signal, vertex craniotomy on the left. Sinuses/Orbits: Stable.  Mild sphenoid sinus mucosal thickening. Other: Visible internal auditory structures appear normal. Stable scalp soft tissues. IMPRESSION: 1. Further progressed enhancement and vasogenic edema at the left occipital metastasis site. Treatment effect/pseudoprogression favored over true progression of disease at this time. 2. Regression of left superior frontal gyrus resection cavity enhancement and edema, with post treatment appearance there now resembling that in August 2022. 3. No new metastatic disease identified. Electronically Signed   By: Genevie Ann M.D.   On: 07/19/2021 08:02      Assessment/Plan Brain metastasis ([facility])  Seizure ([facility])  Eva N Gerstenberger is clinically stable today.  MRI brain demonstrates further progression of left parietal lesion, treated with SRS in June 2022.  Etiology is likely post-RT treatment effect, vs less likely neoplasm.  Will con't close imaging surveillance.  Recommended continuing Keppra 1000mg  BID and Vimpat 100mg  BID.  Decadron should decrease to 1mg  daily if tolerated, then discontinue in 1-2 weeks if able.  Will con't systemic therapy with Dr. Julien Nordmann.  We appreciate the opportunity to participate in the care of Hannah [name].    We ask that Hannah [name] return to clinic in 2 months following next brain MRI, or sooner as needed.  All questions were answered. The patient knows to call the clinic with any problems, questions or concerns. No barriers to  learning were detected.  The total time spent in the encounter was 30 minutes and more than 50% was on counseling and review of test results   Ventura Sellers, MD Medical Director of Neuro-Oncology [facility] [place] 07/23/21 3:58 PM

## 2021-07-24 ENCOUNTER — Encounter: Payer: Self-pay | Admitting: Internal Medicine

## 2021-07-24 ENCOUNTER — Inpatient Hospital Stay: Payer: Medicare HMO

## 2021-07-24 ENCOUNTER — Inpatient Hospital Stay: Payer: Medicare HMO | Admitting: Internal Medicine

## 2021-07-24 ENCOUNTER — Telehealth (HOSPITAL_BASED_OUTPATIENT_CLINIC_OR_DEPARTMENT_OTHER): Payer: Medicare HMO | Admitting: Psychiatry

## 2021-07-24 VITALS — BP 128/69 | HR 69 | Temp 97.8°F | Resp 18 | Ht 61.0 in | Wt 129.2 lb

## 2021-07-24 DIAGNOSIS — C3490 Malignant neoplasm of unspecified part of unspecified bronchus or lung: Secondary | ICD-10-CM

## 2021-07-24 DIAGNOSIS — C3411 Malignant neoplasm of upper lobe, right bronchus or lung: Secondary | ICD-10-CM

## 2021-07-24 DIAGNOSIS — Z5112 Encounter for antineoplastic immunotherapy: Secondary | ICD-10-CM | POA: Diagnosis not present

## 2021-07-24 DIAGNOSIS — Z5111 Encounter for antineoplastic chemotherapy: Secondary | ICD-10-CM | POA: Diagnosis not present

## 2021-07-24 DIAGNOSIS — F32A Depression, unspecified: Secondary | ICD-10-CM | POA: Diagnosis not present

## 2021-07-24 DIAGNOSIS — F324 Major depressive disorder, single episode, in partial remission: Secondary | ICD-10-CM

## 2021-07-24 DIAGNOSIS — K219 Gastro-esophageal reflux disease without esophagitis: Secondary | ICD-10-CM | POA: Diagnosis not present

## 2021-07-24 DIAGNOSIS — C7931 Secondary malignant neoplasm of brain: Secondary | ICD-10-CM | POA: Diagnosis not present

## 2021-07-24 DIAGNOSIS — E785 Hyperlipidemia, unspecified: Secondary | ICD-10-CM | POA: Diagnosis not present

## 2021-07-24 DIAGNOSIS — K5900 Constipation, unspecified: Secondary | ICD-10-CM | POA: Diagnosis not present

## 2021-07-24 DIAGNOSIS — I10 Essential (primary) hypertension: Secondary | ICD-10-CM | POA: Diagnosis not present

## 2021-07-24 LAB — CBC WITH DIFFERENTIAL (CANCER CENTER ONLY)
Abs Immature Granulocytes: 0.09 10*3/uL — ABNORMAL HIGH (ref 0.00–0.07)
Basophils Absolute: 0 10*3/uL (ref 0.0–0.1)
Basophils Relative: 1 %
Eosinophils Absolute: 0 10*3/uL (ref 0.0–0.5)
Eosinophils Relative: 0 %
HCT: 35.3 % — ABNORMAL LOW (ref 36.0–46.0)
Hemoglobin: 10.8 g/dL — ABNORMAL LOW (ref 12.0–15.0)
Immature Granulocytes: 1 %
Lymphocytes Relative: 32 %
Lymphs Abs: 2.4 10*3/uL (ref 0.7–4.0)
MCH: 28 pg (ref 26.0–34.0)
MCHC: 30.6 g/dL (ref 30.0–36.0)
MCV: 91.5 fL (ref 80.0–100.0)
Monocytes Absolute: 0.8 10*3/uL (ref 0.1–1.0)
Monocytes Relative: 11 %
Neutro Abs: 4.1 10*3/uL (ref 1.7–7.7)
Neutrophils Relative %: 55 %
Platelet Count: 234 10*3/uL (ref 150–400)
RBC: 3.86 MIL/uL — ABNORMAL LOW (ref 3.87–5.11)
RDW: 14.2 % (ref 11.5–15.5)
WBC Count: 7.4 10*3/uL (ref 4.0–10.5)
nRBC: 0 % (ref 0.0–0.2)

## 2021-07-24 LAB — CMP (CANCER CENTER ONLY)
ALT: 25 U/L (ref 0–44)
AST: 24 U/L (ref 15–41)
Albumin: 4 g/dL (ref 3.5–5.0)
Alkaline Phosphatase: 56 U/L (ref 38–126)
Anion gap: 6 (ref 5–15)
BUN: 19 mg/dL (ref 8–23)
CO2: 30 mmol/L (ref 22–32)
Calcium: 9.7 mg/dL (ref 8.9–10.3)
Chloride: 104 mmol/L (ref 98–111)
Creatinine: 0.7 mg/dL (ref 0.44–1.00)
GFR, Estimated: >60 mL/min (ref >60)
Glucose, Bld: 124 mg/dL — ABNORMAL HIGH (ref 70–99)
Potassium: 3.8 mmol/L (ref 3.5–5.1)
Sodium: 140 mmol/L (ref 135–145)
Total Bilirubin: 0.2 mg/dL — ABNORMAL LOW (ref 0.3–1.2)
Total Protein: 6.8 g/dL (ref 6.5–8.1)

## 2021-07-24 LAB — TSH: TSH: 1.153 u[IU]/mL (ref 0.308–3.960)

## 2021-07-24 MED ORDER — ALPRAZOLAM 0.5 MG PO TABS: ORAL_TABLET | ORAL | 5 refills | Status: DC

## 2021-07-24 MED ORDER — SODIUM CHLORIDE 0.9 % IV SOLN: Freq: Once | INTRAVENOUS | Status: AC

## 2021-07-24 MED ORDER — PROCHLORPERAZINE MALEATE 10 MG PO TABS
10.0000 mg | ORAL_TABLET | Freq: Once | ORAL | Status: AC
  Administered 2021-07-24: 11:00:00 10 mg via ORAL
  Filled 2021-07-24: qty 1

## 2021-07-24 MED ORDER — SODIUM CHLORIDE 0.9 % IV SOLN
500.0000 mg/m2 | Freq: Once | INTRAVENOUS | Status: AC
  Administered 2021-07-24: 13:00:00 700 mg via INTRAVENOUS
  Filled 2021-07-24: qty 20

## 2021-07-24 MED ORDER — ALPRAZOLAM ER 0.5 MG PO TB24: ORAL_TABLET | ORAL | 5 refills | Status: DC

## 2021-07-24 MED ORDER — MIRTAZAPINE 45 MG PO TABS: 45.0000 mg | ORAL_TABLET | Freq: Every day | ORAL | 3 refills | Status: DC

## 2021-07-24 MED ORDER — CITALOPRAM HYDROBROMIDE 10 MG PO TABS: 10.0000 mg | ORAL_TABLET | Freq: Every day | ORAL | 1 refills | Status: DC

## 2021-07-24 MED ORDER — SODIUM CHLORIDE 0.9 % IV SOLN
200.0000 mg | Freq: Once | INTRAVENOUS | Status: AC
  Administered 2021-07-24: 12:00:00 200 mg via INTRAVENOUS
  Filled 2021-07-24: qty 200

## 2021-07-24 NOTE — Progress Notes (Addendum)
Psychiatric Initial Adult Assessment   Patient Identification: Hannah [name] MRN:  [id number] Date of Evaluation:  07/24/2021 Referral Source: From the community Chief Complaint:   Visit Diagnosis: No diagnosis found.   Virtual Visit via Telephone Note  I connected with Hannah [name] on 03/17/23 at  2:00 PM EST by telephone and verified that I am speaking with the correct person using two identifiers.  Location: Patient: home Provider: office   I discussed the limitations, risks, security and privacy concerns of performing an evaluation and management service by telephone and the availability of in person appointments. I also discussed with the patient that there may be a patient responsible charge related to this service. The patient expressed understanding and agreed to proceed.     I discussed the assessment and treatment plan with the patient. The patient was provided an opportunity to ask questions and all were answered. The patient agreed with the plan and demonstrated an understanding of the instructions.   The patient was advised to call back or seek an in-person evaluation if the symptoms worsen or if the condition fails to improve as anticipated.  I provided 30 minutes of non-face-to-face time during this encounter.   Gypsy Balsam, MD   Today the patient is doing only fairly well.  It turns out they found an abnormality on her lower portion of her brain which is not sure if it relates to radiation treatment for recurrence of her malignancy.  This woman has had lung cancer with metastatic disease.  Recently over the last few months we reduced her Xanax.  She was taking 1 mg extended release every night but she felt oversedated.  Therefore we will reduce to.5 milligrams XR every night.  She was taking the regular Xanax through the day at different times.  Her recommendations were to taking 1 in the morning and 1 in the afternoon.  She actually is taking less than that.   Lately she has been much a lot more anxious because of what has been going on.  Therefore I assured her that there to be no problems she took Xanax 0.5 mg 1 or 2 during the day in addition to the extended release 0.5 she takes at night.  She will continue taking her other medications including Celexa and Remeron as ordered.  The patient stays active.  She likes music and reading.  She does crosswords.  She is exercising a little bit.  At this time she is slowly coming off steroids which are used to try to reduce the inflammation in her brain.  I suspect it is having an effect on her emotionally.  She will return to talk to me in about 2 to 3 months about this.  For now she will continue all the medications she is taking.  She actually is sleeping and eating well.  She is not suicidal.  She is thinking and concentrating pretty well and has a good sense of work.  Today we spoke to her and her daughter [name]. History of Present Illness:  Associated Signs/Symptoms: Depression Symptoms:  depressed mood, (Hypo) Manic Symptoms:   Anxiety Symptoms:   Psychotic Symptoms:   PTSD Symptoms:   Past Psychiatric History:   Previous Psychotropic Medications:   Substance Abuse History in the last 12 months:    Consequences of Substance Abuse:   Past Medical History:  Past Medical History:  Diagnosis Date   Allergy    Anemia    Anxiety    Arthritis  Carpal tunnel syndrome    Constipation    senna C stool softeners help    Depression    Diverticulosis    Dyslipidemia    External hemorrhoids    GERD (gastroesophageal reflux disease)    Heart murmur    mild-moderate AR   Hiatal hernia    Hyperlipidemia    on meds    Hypertension    Internal hemorrhoids    lung ca with brain mets 07/2020   Osteoarthritis    Pre-diabetes    PVD (peripheral vascular disease) (HCC)    moderate carotid disease   RBBB    Smoker    Vocal cord polyps     Past Surgical History:  Procedure Laterality Date    ABDOMINAL HYSTERECTOMY  1994   APPLICATION OF CRANIAL NAVIGATION N/A 08/24/2020   Procedure: APPLICATION OF CRANIAL NAVIGATION;  Surgeon: Jadene Pierini, MD;  Location: [facility] OR;  Service: Neurosurgery;  Laterality: N/A;   COLONOSCOPY     COLONOSCOPY W/ POLYPECTOMY  2009   CRANIOTOMY Left 08/24/2020   Procedure: Left Craniotomy for Tumor Resection with Brainlab;  Surgeon: Jadene Pierini, MD;  Location[facility];  Service: Neurosurgery;  Laterality: Left;   HEMORRHOID SURGERY     INTERCOSTAL NERVE BLOCK Right 09/24/2020   Procedure: INTERCOSTAL NERVE BLOCK;  Surgeon: Loreli Slot, MD;  Location: [facility] OR;  Service: Thoracic;  Laterality: Right;   JOINT REPLACEMENT     LYMPH NODE DISSECTION Right 09/24/2020   Procedure: LYMPH NODE DISSECTION;  Surgeon: Loreli Slot, MD;  Location: [facility] OR;  Service: Thoracic;  Laterality: Right;   POLYPECTOMY     right total knee arthroplasty     Dr. Sherle Poe 06-04-18   SPINE SURGERY  08/16/2009   TOTAL KNEE ARTHROPLASTY Left 02/13/2014   Procedure: TOTAL KNEE ARTHROPLASTY;  Surgeon: Harvie Junior, MD;  Location: [facility] OR;  Service: Orthopedics;  Laterality: Left;   TOTAL KNEE ARTHROPLASTY Right 06/04/2018   Procedure: RIGHT TOTAL KNEE ARTHROPLASTY;  Surgeon: Jodi Geralds, MD;  Location: [facility] ORS;  Service: Orthopedics;  Laterality: Right;  Adductor Block   TUBAL LIGATION      Family Psychiatric History:   Family History:  Family History  Problem Relation Age of Onset   Cancer Father    Prostate cancer Father    Diabetes Sister    Cancer Sister    Stroke Maternal Grandfather    Colitis Maternal Aunt    Breast cancer Maternal Aunt    Colon cancer Neg Hx    Colon polyps Neg Hx    Rectal cancer Neg Hx    Stomach cancer Neg Hx    Esophageal cancer Neg Hx     Social History:   Social History   Socioeconomic History   Marital status: Married    Spouse name: Not on file   Number of children: 2   Years of education: Not on file   Highest  education level: Not on file  Occupational History   Not on file  Tobacco Use   Smoking status: Former    Packs/day: 0.25    Years: 40.00    Pack years: 10.00    Types: Cigarettes    Quit date: 07/09/2020    Years since quitting: 1.0    Passive exposure: Never   Smokeless tobacco: Never   Tobacco comments:    PACK WILL LAST 3 days  Vaping Use   Vaping Use: Never used  Substance and Sexual Activity   Alcohol  use: No   Drug use: No   Sexual activity: Not Currently  Other Topics Concern   Not on file  Social History Narrative   Not on file   Social Determinants of Health   Financial Resource Strain: Low Risk    Difficulty of Paying Living Expenses: Not hard at all  Food Insecurity: No Food Insecurity   Worried About Programme researcher, broadcasting/film/video in the Last Year: Never true   Ran Out of Food in the Last Year: Never true  Transportation Needs: No Transportation Needs   Lack of Transportation (Medical): No   Lack of Transportation (Non-Medical): No  Physical Activity: Sufficiently Active   Days of Exercise per Week: 3 days   Minutes of Exercise per Session: 60 min  Stress: No Stress Concern Present   Feeling of Stress : Not at all  Social Connections: Socially Integrated   Frequency of Communication with Friends and Family: More than three times a week   Frequency of Social Gatherings with Friends and Family: More than three times a week   Attends Religious Services: More than 4 times per year   Active Member of [employer] or Organizations: Yes   Attends Engineer, structural: More than 4 times per year   Marital Status: Married    Additional Social History:   Allergies:   Allergies  Allergen Reactions   Amlodipine Swelling and Rash    Rash, swelling   Chantix [Varenicline Tartrate] Shortness Of Breath, Swelling and Other (See Comments)    Tongue swell,sob   Clarithromycin Rash   Lisinopril Hives   Simvastatin Hives   Wellbutrin [Bupropion Hcl] Hives   Lipitor  [Atorvastatin]     Dizziness per patient   Sertraline     Makes her feel like she is going to kill someone    Metabolic Disorder Labs: Lab Results  Component Value Date   HGBA1C 6.0 03/20/2021   MPG 142.72 09/25/2020   MPG 140 03/14/2020   No results found for: PROLACTIN Lab Results  Component Value Date   CHOL 171 03/20/2021   TRIG 286.0 (H) 03/20/2021   HDL 46.90 03/20/2021   CHOLHDL 4 03/20/2021   VLDL 57.2 (H) 03/20/2021   LDLCALC 62 03/14/2020   LDLCALC 63 09/12/2019   Lab Results  Component Value Date   TSH 1.153 07/24/2021    Therapeutic Level Labs: No results found for: LITHIUM No results found for: CBMZ No results found for: VALPROATE  Current Medications: Current Outpatient Medications  Medication Sig Dispense Refill   acetaminophen (TYLENOL) 500 MG tablet Take 2 tablets (1,000 mg total) by mouth every 6 (six) hours. 30 tablet 0   ALPRAZolam (XANAX XR) 0.5 MG 24 hr tablet Take one tablet (0.5 mg total) by mouth every night at bedtime. 30 tablet 5   ALPRAZolam (XANAX) 0.5 MG tablet Take one tablet (0.5 mg total) by mouth qam and may take one more tablet (0.5 mg total) qd prn for anxiety. 120 tablet 5   Ascorbic Acid (VITAMIN C) 1000 MG tablet Take 1,000 mg by mouth daily.     aspirin 81 MG tablet Take 1 tablet (81 mg total) by mouth daily. Restart on 08/31/20 30 tablet    bisacodyl (DULCOLAX) 5 MG EC tablet Take 5 mg by mouth daily as needed for moderate constipation. (Patient not taking: Reported on 07/23/2021)     carboxymethylcellulose (REFRESH PLUS) 0.5 % SOLN Place 1-2 drops into both eyes 3 (three) times daily as needed (dry eyes).  cephALEXin (KEFLEX) 500 MG capsule Take 1 capsule (500 mg total) by mouth 2 (two) times daily. 14 capsule 0   Cholecalciferol (D3 PO) Take 1 capsule by mouth daily.     citalopram (CELEXA) 10 MG tablet Take 1 tablet (10 mg total) by mouth daily. 30 tablet 1   dexamethasone (DECADRON) 1 MG tablet Take 1 tablet (1 mg total) by  mouth daily. 60 tablet 1   folic acid (FOLVITE) 1 MG tablet Take 1 tablet (1 mg total) by mouth daily. 30 tablet 2   Lacosamide 100 MG TABS Take 1 tablet (100 mg total) by mouth 2 (two) times daily. 60 tablet 3   levETIRAcetam (KEPPRA) 100 MG/ML solution Take 15 mLs (1,500 mg total) by mouth 2 (two) times daily. 473 mL 6   loratadine (CLARITIN) 10 MG tablet Take 10 mg by mouth daily as needed for allergies. (Patient not taking: Reported on 07/23/2021)     Menthol, Topical Analgesic, (BIOFREEZE EX) Apply 1 application topically as needed (pain). (Patient not taking: Reported on 07/23/2021)     mirtazapine (REMERON) 45 MG tablet Take 1 tablet (45 mg total) by mouth at bedtime. 30 tablet 3   olopatadine (PATANOL) 0.1 % ophthalmic solution Place 1 drop into both eyes 2 (two) times daily as needed for allergies. (Patient not taking: Reported on 07/23/2021)     omeprazole (PRILOSEC) 40 MG capsule TAKE 1 CAPSULE (40 MG TOTAL) BY MOUTH 2 (TWO) TIMES DAILY BEFORE A MEAL. (Patient taking differently: Take 40 mg by mouth in the morning and at bedtime.) 180 capsule 0   polyethylene glycol (MIRALAX / GLYCOLAX) 17 g packet Take 17 g by mouth 2 (two) times daily. 72 each 0   pravastatin (PRAVACHOL) 40 MG tablet TAKE 1 TABLET (40 MG TOTAL) BY MOUTH EVERY EVENING. 90 tablet 0   Probiotic Product (PROBIOTIC DAILY PO) Take 1 capsule by mouth daily.     prochlorperazine (COMPAZINE) 10 MG tablet Take 1 tablet (10 mg total) by mouth every 6 (six) hours as needed for nausea or vomiting. (Patient not taking: Reported on 07/23/2021) 30 tablet 0   simethicone (MYLICON) 80 MG chewable tablet Chew 1 tablet (80 mg total) by mouth 4 (four) times daily as needed for flatulence (Bloating). 30 tablet 0   No current facility-administered medications for this visit.    Musculoskeletal: Strength & Muscle Tone: within normal limits Gait & Station: normal Patient leans: N/A  Psychiatric Specialty Exam: Review of Systems  There were  no vitals taken for this visit.There is no height or weight on file to calculate BMI.  General Appearance: Casual  Eye Contact:  Good  Speech:  Clear and Coherent  Volume:  Normal  Mood:  Depressed  Affect:  Appropriate  Thought Process:  Goal Directed  Orientation:  NA  Thought Content:  Logical  Suicidal Thoughts:  No  Homicidal Thoughts:  No  Memory:  NA  Judgement:  Good  Insight:  Good  Psychomotor Activity:  NA and Normal  Concentration:    Recall:  Good  Fund of Knowledge:Good  Language: Good  Akathisia:  No  Handed:  Right  AIMS (if indicated):  not done  Assets:  Desire for Improvement  ADL's:  Intact  Cognition: WNL  Sleep:  Good   Screenings: GAD-7    Flowsheet Row Office Visit from 09/12/2019 in [facility] at [employer]  Total GAD-7 Score 7      Mini-Mental    Flowsheet Row Clinical Support from  12/21/2017 in [facility]  Total Score (max 30 points ) 30      PHQ2-9    Flowsheet Row Office Visit from 05/28/2021 in [facility] at [facility] Clinical Support from 04/12/2021 in [facility] at [facility] Chronic Care Management from 03/05/2021 in [facility] at [facility] Chronic Care Management from 12/27/2020 in [facility] at [facility] Chronic Care Management from 10/10/2020 in [facility] at [facility] Total Score 0 0 1 0 1      Flowsheet Row ED from 05/28/2021 in [facility] EMERGENCY DEPARTMENT ED to Hosp-Admission (Discharged) from 04/22/2021 in [facility] ED from 02/12/2021 in [facility]EM[facility]ENCY DE[facility]  C-SSRS RISK CATEGORY No Risk No Risk No Risk       Assessment and Plan:   This patient suffers from to psychiatric conditions.  The first is major depression.  She will continue taking Celexa 10 mg and Remeron as ordered.  Her second problem is an adjustment disorder with an anxious mood state.  She will  continue taking Xanax 0.5 mg ER every night and she will also take have availability of Xanax 0.5 mg standard to taking 1 or 2 during the day as needed.  This patient will be seen again in 3 months.  The patient is functioning really well.  She has a very supportive family.  Gypsy Balsam, MD 1/25/20232:48 PM

## 2021-07-24 NOTE — Patient Instructions (Signed)
[facility]  Discharge Instructions: Thank you for choosing [facility] to provide your oncology and hematology care.   If you have a lab appointment with the [facility], please go directly to the [facility] and check in at the registration area.   Wear comfortable clothing and clothing appropriate for easy access to any Portacath or PICC line.   We strive to give you quality time with your provider. You may need to reschedule your appointment if you arrive late (15 or more minutes).  Arriving late affects you and other patients whose appointments are after yours.  Also, if you miss three or more appointments without notifying the office, you may be dismissed from the clinic at the providers discretion.      For prescription refill requests, have your pharmacy contact our office and allow 72 hours for refills to be completed.    Today you received the following chemotherapy and/or immunotherapy agents:  Pemetrexed & Pembrolixumab.       To help prevent nausea and vomiting after your treatment, we encourage you to take your nausea medication as directed.  BELOW ARE SYMPTOMS THAT SHOULD BE REPORTED IMMEDIATELY: *FEVER GREATER THAN 100.4 F (38 C) OR HIGHER *CHILLS OR SWEATING *NAUSEA AND VOMITING THAT IS NOT CONTROLLED WITH YOUR NAUSEA MEDICATION *UNUSUAL SHORTNESS OF BREATH *UNUSUAL BRUISING OR BLEEDING *URINARY PROBLEMS (pain or burning when urinating, or frequent urination) *BOWEL PROBLEMS (unusual diarrhea, constipation, pain near the anus) TENDERNESS IN MOUTH AND THROAT WITH OR WITHOUT PRESENCE OF ULCERS (sore throat, sores in mouth, or a toothache) UNUSUAL RASH, SWELLING OR PAIN  UNUSUAL VAGINAL DISCHARGE OR ITCHING   Items with * indicate a potential emergency and should be followed up as soon as possible or go to the Emergency Department if any problems should occur.  Please show the CHEMOTHERAPY ALERT CARD or IMMUNOTHERAPY ALERT  CARD at check-in to the Emergency Department and triage nurse.  Should you have questions after your visit or need to cancel or reschedule your appointment, please contact [facility]  Dept: [phone]  and follow the prompts.  Office hours are 8:00 a.m. to 4:30 p.m. Monday - Friday. Please note that voicemails left after 4:00 p.m. may not be returned until the following business day.  We are closed weekends and major holidays. You have access to a nurse at all times for urgent questions. Please call the main number to the clinic Dept: [phone] and follow the prompts.   For any non-urgent questions, you may also contact your provider using MyChart. We now offer e-Visits for anyone 74 and older to request care online for non-urgent symptoms. For details visit [email or web address].   Also download the MyChart app! Go to the app store, search "MyChart", open the app, select [facility], and log in with your MyChart username and password.  Due to Covid, a mask is required upon entering the hospital/clinic. If you do not have a mask, one will be given to you upon arrival. For doctor visits, patients may have 1 support person aged 36 or older with them. For treatment visits, patients cannot have anyone with them due to current Covid guidelines and our immunocompromised population.

## 2021-07-24 NOTE — Progress Notes (Signed)
[facility] [phone]) [phone]   Fax:([phone]) [phone]  OFFICE PROGRESS NOTE  [name], MD [place] [address]  DIAGNOSIS: Stage IV non-small cell lung cancer (T, N0, M1C) adenocarcinoma.  The patient presented with a and solitary brain metastasis.  The patient was diagnosed in February 2022.  Biomarker Findings Microsatellite status - MS-Stable Tumor Mutational Burden - 8 Muts/Mb Genomic Findings For a complete list of the genes assayed, please refer to the Appendix. KRAS G12V KEAP1 S224F TP53 P151T 7 Disease relevant genes with no reportable alterations: ALK, BRAF, EGFR, ERBB2, MET, RET, ROS1  PDL1 Expression: 50%    PRIOR THERAPY: 1) SRS to the metastatic brain lesion on 08/23/2020 under the care of Dr. Tammi Klippel and craniotomy under the care of Dr. Zada Finders scheduled for 08/24/2020. 2) S/p robotic assisted right upper lobectomy with en bloc wedge resection of the right middle lobe and lymph node dissection under the care of Dr. Roxan Hockey on September 24, 2020. 3) status post SRS to a new subcentimeter brain lesions under the care of Dr. Lisbeth Renshaw.   CURRENT THERAPY: Systemic chemotherapy with carboplatin for AUC of 5, Alimta 500 Mg/M2 and Keytruda 200 mg IV every 3 weeks.  First dose Nov 07, 2020.  Status post 11 cycles.  Starting from cycle #5 the patient will be on maintenance treatment with Alimta and Keytruda every 3 weeks.   INTERVAL HISTORY: Hannah [name] 71 y.o. female returns to the clinic today for follow-up visit accompanied by her sister-in-law.  The patient is feeling fine today with no concerning complaints except for mild fatigue.  She has been tolerating her treatment fairly well with no concerning adverse effects.  She has no chest pain, shortness of breath, cough or hemoptysis.  She denied having any fever or chills.  She has no nausea, vomiting, diarrhea or constipation.  She had MRI of the brain performed recently that  showed regression of some of the lesion but mild increase in other and she is currently on observation by radiation oncology.  The patient is here today for evaluation before starting cycle #12 of her treatment.   MEDICAL HISTORY: Past Medical History:  Diagnosis Date   Allergy    Anemia    Anxiety    Arthritis    Carpal tunnel syndrome    Constipation    senna C stool softeners help    Depression    Diverticulosis    Dyslipidemia    External hemorrhoids    GERD (gastroesophageal reflux disease)    Heart murmur    mild-moderate AR   Hiatal hernia    Hyperlipidemia    on meds    Hypertension    Internal hemorrhoids    lung ca with brain mets 07/2020   Osteoarthritis    Pre-diabetes    PVD (peripheral vascular disease) (HCC)    moderate carotid disease   RBBB    Smoker    Vocal cord polyps     ALLERGIES:  is allergic to amlodipine, chantix [varenicline tartrate], clarithromycin, lisinopril, simvastatin, wellbutrin [bupropion hcl], lipitor [atorvastatin], and sertraline.  MEDICATIONS:  Current Outpatient Medications  Medication Sig Dispense Refill   acetaminophen (TYLENOL) 500 MG tablet Take 2 tablets (1,000 mg total) by mouth every 6 (six) hours. 30 tablet 0   ALPRAZolam (XANAX XR) 0.5 MG 24 hr tablet Take one tablet (0.5 mg total) by mouth every night at bedtime. 10 tablet 0   ALPRAZolam (XANAX) 0.5 MG  tablet Take one tablet (0.5 mg total) by mouth qam and may take one more tablet (0.5 mg total) qd prn for anxiety. 120 tablet 5   Ascorbic Acid (VITAMIN C) 1000 MG tablet Take 1,000 mg by mouth daily.     aspirin 81 MG tablet Take 1 tablet (81 mg total) by mouth daily. Restart on 08/31/20 30 tablet    bisacodyl (DULCOLAX) 5 MG EC tablet Take 5 mg by mouth daily as needed for moderate constipation. (Patient not taking: Reported on 07/23/2021)     carboxymethylcellulose (REFRESH PLUS) 0.5 % SOLN Place 1-2 drops into both eyes 3 (three) times daily as needed (dry eyes).      cephALEXin (KEFLEX) 500 MG capsule Take 1 capsule (500 mg total) by mouth 2 (two) times daily. 14 capsule 0   Cholecalciferol (D3 PO) Take 1 capsule by mouth daily.     citalopram (CELEXA) 10 MG tablet Take 1 tablet (10 mg total) by mouth daily. 30 tablet 1   dexamethasone (DECADRON) 1 MG tablet Take 1 tablet (1 mg total) by mouth daily. 60 tablet 1   folic acid (FOLVITE) 1 MG tablet Take 1 tablet (1 mg total) by mouth daily. 30 tablet 2   Lacosamide 100 MG TABS Take 1 tablet (100 mg total) by mouth 2 (two) times daily. 60 tablet 3   levETIRAcetam (KEPPRA) 100 MG/ML solution Take 15 mLs (1,500 mg total) by mouth 2 (two) times daily. 473 mL 6   loratadine (CLARITIN) 10 MG tablet Take 10 mg by mouth daily as needed for allergies. (Patient not taking: Reported on 07/23/2021)     Menthol, Topical Analgesic, (BIOFREEZE EX) Apply 1 application topically as needed (pain). (Patient not taking: Reported on 07/23/2021)     mirtazapine (REMERON) 45 MG tablet Take 1 tablet (45 mg total) by mouth at bedtime. 30 tablet 3   olopatadine (PATANOL) 0.1 % ophthalmic solution Place 1 drop into both eyes 2 (two) times daily as needed for allergies. (Patient not taking: Reported on 07/23/2021)     omeprazole (PRILOSEC) 40 MG capsule TAKE 1 CAPSULE (40 MG TOTAL) BY MOUTH 2 (TWO) TIMES DAILY BEFORE A MEAL. (Patient taking differently: Take 40 mg by mouth in the morning and at bedtime.) 180 capsule 0   polyethylene glycol (MIRALAX / GLYCOLAX) 17 g packet Take 17 g by mouth 2 (two) times daily. 72 each 0   pravastatin (PRAVACHOL) 40 MG tablet TAKE 1 TABLET (40 MG TOTAL) BY MOUTH EVERY EVENING. 90 tablet 0   Probiotic Product (PROBIOTIC DAILY PO) Take 1 capsule by mouth daily.     prochlorperazine (COMPAZINE) 10 MG tablet Take 1 tablet (10 mg total) by mouth every 6 (six) hours as needed for nausea or vomiting. (Patient not taking: Reported on 07/23/2021) 30 tablet 0   simethicone (MYLICON) 80 MG chewable tablet Chew 1 tablet (80  mg total) by mouth 4 (four) times daily as needed for flatulence (Bloating). 30 tablet 0   No current facility-administered medications for this visit.    SURGICAL HISTORY:  Past Surgical History:  Procedure Laterality Date   ABDOMINAL HYSTERECTOMY  6237   APPLICATION OF CRANIAL NAVIGATION N/A 08/24/2020   Procedure: APPLICATION OF CRANIAL NAVIGATION;  Surgeon: Judith Part, MD;  Location: [facility];  Service: Neurosurgery;  Laterality: N/A;   COLONOSCOPY     COLONOSCOPY W/ POLYPECTOMY  2009   CRANIOTOMY Left 08/24/2020   Procedure: Left Craniotomy for Tumor Resection with Brainlab;  Surgeon: Judith Part, MD;  Location: [facility];  Service: Neurosurgery;  Laterality: Left;   HEMORRHOID SURGERY     INTERCOSTAL NERVE BLOCK Right 09/24/2020   Procedure: INTERCOSTAL NERVE BLOCK;  Surgeon: Melrose Nakayama, MD;  Location: [facility];  Service: Thoracic;  Laterality: Right;   JOINT REPLACEMENT     LYMPH NODE DISSECTION Right 09/24/2020   Procedure: LYMPH NODE DISSECTION;  Surgeon: Melrose Nakayama, MD;  Location: [facility];  Service: Thoracic;  Laterality: Right;   POLYPECTOMY     right total knee arthroplasty     Dr. Emeterio Reeve 06-04-18   [place]  08/16/2009   TOTAL KNEE ARTHROPLASTY Left 02/13/2014   Procedure: TOTAL KNEE ARTHROPLASTY;  Surgeon: Alta Corning, MD;  Location: [facility];  Service: Orthopedics;  Laterality: Left;   TOTAL KNEE ARTHROPLASTY Right 06/04/2018   Procedure: RIGHT TOTAL KNEE ARTHROPLASTY;  Surgeon: Dorna Leitz, MD;  Location: [facility];  Service: Orthopedics;  Laterality: Right;  Adductor Block   TUBAL LIGATION      REVIEW OF SYSTEMS:  A comprehensive review of systems was negative except for: Constitutional: positive for fatigue   PHYSICAL EXAMINATION: General appearance: alert, cooperative, fatigued, and no distress Head: Normocephalic, without obvious abnormality, atraumatic Neck: no adenopathy, no JVD, supple, symmetrical, trachea midline, and thyroid not  enlarged, symmetric, no tenderness/mass/nodules Lymph nodes: Cervical, supraclavicular, and axillary nodes normal. Resp: clear to auscultation bilaterally Back: symmetric, no curvature. ROM normal. No CVA tenderness. Cardio: regular rate and rhythm, S1, S2 normal, no murmur, click, rub or gallop GI: soft, non-tender; bowel sounds normal; no masses,  no organomegaly Extremities: extremities normal, atraumatic, no cyanosis or edema  ECOG PERFORMANCE STATUS: 1 - Symptomatic but completely ambulatory  Blood pressure 128/69, pulse 69, temperature 97.8 F (36.6 C), temperature source Tympanic, resp. rate 18, height $RemoveBe'5\' 1"'JQiTVZetd$  (1.549 m), weight 129 lb 3.2 oz (58.6 kg), SpO2 100 %.  LABORATORY DATA: Lab Results  Component Value Date   WBC 7.4 07/24/2021   HGB 10.8 (L) 07/24/2021   HCT 35.3 (L) 07/24/2021   MCV 91.5 07/24/2021   PLT 234 07/24/2021      Chemistry      Component Value Date/Time   NA 140 07/24/2021 0937   K 3.8 07/24/2021 0937   CL 104 07/24/2021 0937   CO2 30 07/24/2021 0937   BUN 19 07/24/2021 0937   CREATININE 0.70 07/24/2021 0937   CREATININE 0.79 03/14/2020 1016      Component Value Date/Time   CALCIUM 9.7 07/24/2021 0937   ALKPHOS 56 07/24/2021 0937   AST 24 07/24/2021 0937   ALT 25 07/24/2021 0937   BILITOT 0.2 (L) 07/24/2021 0937       RADIOGRAPHIC STUDIES: MR BRAIN W WO CONTRAST  Result Date: 07/19/2021 CLINICAL DATA:  71 year old female with metastatic lung cancer status post resection in February 2022 and subsequent SRS to 2 additional small metastases. Increasing edema and abnormal enhancement in October. Restaging. EXAM: MRI HEAD WITHOUT AND WITH CONTRAST TECHNIQUE: Multiplanar, multiecho pulse sequences of the brain and surrounding structures were obtained without and with intravenous contrast. CONTRAST:  60mL MULTIHANCE GADOBENATE DIMEGLUMINE 529 MG/ML IV SOLN COMPARISON:  05/31/2021 and earlier. FINDINGS: Brain: Left superior frontal gyrus resection site  patchy enhancement somewhat contiguous with the dura is mildly regressed since October, in an area of 14 x 18 mm now (previously 16 x 18). Regional FLAIR hyperintensity has also regressed with residual on series 7, image 45. This area now has a similar appearance to August of last year.  No significant regional mass effect. Abundant hemosiderin here is stable. Stellate enhancing left occipital lesion remains significantly enlarged compared to August, about 15 mm diameter now, versus 10-11 mm in December. Regional FLAIR hyperintensity continues to enlarge. See series 7, image 28. Only mild regional mass effect on the left occipital horn. No new areas of abnormal enhancement identified. No superimposed restricted diffusion to suggest acute infarction. No midline shift, ventriculomegaly, scattered moderate for age nonspecific cerebral white matter T2 and FLAIR hyperintensity elsewhere is stable. Or acute intracranial hemorrhage. Cervicomedullary junction and pituitary are within normal limits. Vascular: Major intracranial vascular flow voids are stable. The major dural venous sinuses are enhancing and appear to be patent. Skull and upper cervical spine: Negative visible spinal cord. Stable visible bone marrow signal, vertex craniotomy on the left. Sinuses/Orbits: Stable.  Mild sphenoid sinus mucosal thickening. Other: Visible internal auditory structures appear normal. Stable scalp soft tissues. IMPRESSION: 1. Further progressed enhancement and vasogenic edema at the left occipital metastasis site. Treatment effect/pseudoprogression favored over true progression of disease at this time. 2. Regression of left superior frontal gyrus resection cavity enhancement and edema, with post treatment appearance there now resembling that in August 2022. 3. No new metastatic disease identified. Electronically Signed   By: Genevie Ann M.D.   On: 07/19/2021 08:02     ASSESSMENT AND PLAN: This is a very pleasant 71 years old  African-American female diagnosed with a stage IV (T3, N0, M1b) non-small cell lung cancer, adenocarcinoma presented with right upper lobe lung mass with solitary brain metastasis diagnosed in February 2022 status post SRS to the brain lesion followed by craniotomy and resection. The patient also has a solitary lung mass. S/p robotic assisted right upper lobectomy with en bloc wedge resection of the right middle lobe and lymph node dissection under the care of Dr. Roxan Hockey on September 24, 2020. She also underwent SRS treatment to a new subcentimeter brain lesions under the care of Dr. Lisbeth Renshaw She is currently undergoing systemic chemotherapy with carboplatin for AUC of 5, Alimta 500 Mg/M2 and Keytruda 200 Mg IV every 3 weeks status post 11 cycles.  Starting from cycle #5 the patient will be on maintenance treatment with Alimta and Keytruda every 3 weeks. The patient continues to tolerate her treatment with maintenance Alimta and Keytruda fairly well. I recommended for her to proceed with cycle #12 today as planned. I will see her back for follow-up visit in 3 weeks for evaluation with repeat CT scan of the chest for restaging of her disease. For the vasogenic edema she is currently on Decadron 1 mg p.o. daily and she is followed by Dr. Mickeal Skinner. The patient was advised to call immediately if she has any other concerning symptoms in the interval.  The patient voices understanding of current disease status and treatment options and is in agreement with the current care plan.  All questions were answered. The patient knows to call the clinic with any problems, questions or concerns. We can certainly see the patient much sooner if necessary.  Disclaimer: This note was dictated with voice recognition software. Similar sounding words can inadvertently be transcribed and may not be corrected upon review.

## 2021-07-24 NOTE — Progress Notes (Addendum)
Will continue Alimta 700 mg and will follow and adjust doses if needed based on weight changes.   Larene Beach, PharmD

## 2021-07-25 ENCOUNTER — Other Ambulatory Visit: Payer: Self-pay

## 2021-07-25 ENCOUNTER — Telehealth: Payer: Self-pay | Admitting: Internal Medicine

## 2021-07-25 DIAGNOSIS — R300 Dysuria: Secondary | ICD-10-CM

## 2021-07-25 NOTE — Telephone Encounter (Signed)
Patient daughter [name] calling in  Patient has completed round of antibiotics for previous UTI but [name] would like to have urine rechecked to make sure UTI has completely cleared up so lab orders need to be placed  Patient had round of chemo yesterday & [name] wants to know if this will affect urine sample  Please call [phone]

## 2021-07-25 NOTE — Telephone Encounter (Signed)
Chemo will not change urine.     Ok to recheck.  Please order

## 2021-07-25 NOTE — Telephone Encounter (Signed)
Spoke with daughter today.  Info given and orders placed.

## 2021-07-26 ENCOUNTER — Telehealth (HOSPITAL_COMMUNITY): Payer: Self-pay

## 2021-07-26 NOTE — Telephone Encounter (Signed)
Patient's daughter called regarding her mom's Alprazolam XR 0.5mg , Alprazolam 0.5mg , and her mom's Citalopram 10mg . Daughter stated they had trouble getting the medications. Writer called pharmacy and pharmacist stated that they're filling the Citalopram today (Friday,1/27) and that it will be ready after 2pm. Pharmacist also stated that the Alprazolams 0.5mg  need to be ordered from the manufacturer and should be in Monday, 1/30. Pharmacist stated to have the pt call on Monday afternoon to check on that medication.  WRITER CALLED PT'S DAUGHTER AND RELAYED MESSAGE

## 2021-07-29 ENCOUNTER — Other Ambulatory Visit: Payer: Self-pay

## 2021-07-29 ENCOUNTER — Emergency Department (HOSPITAL_BASED_OUTPATIENT_CLINIC_OR_DEPARTMENT_OTHER)
Admission: EM | Admit: 2021-07-29 | Discharge: 2021-07-29 | Disposition: A | Discharge status: Home / Self Care | Payer: Medicare HMO | Attending: Emergency Medicine | Admitting: Emergency Medicine

## 2021-07-29 ENCOUNTER — Ambulatory Visit: Payer: Medicare HMO | Admitting: Internal Medicine

## 2021-07-29 ENCOUNTER — Encounter (HOSPITAL_BASED_OUTPATIENT_CLINIC_OR_DEPARTMENT_OTHER): Payer: Self-pay | Admitting: Obstetrics and Gynecology

## 2021-07-29 ENCOUNTER — Emergency Department (HOSPITAL_BASED_OUTPATIENT_CLINIC_OR_DEPARTMENT_OTHER): Payer: Medicare HMO | Admitting: Radiology

## 2021-07-29 ENCOUNTER — Telehealth: Payer: Self-pay | Admitting: *Deleted

## 2021-07-29 DIAGNOSIS — R079 Chest pain, unspecified: Secondary | ICD-10-CM | POA: Insufficient documentation

## 2021-07-29 DIAGNOSIS — R059 Cough, unspecified: Secondary | ICD-10-CM | POA: Insufficient documentation

## 2021-07-29 DIAGNOSIS — J90 Pleural effusion, not elsewhere classified: Secondary | ICD-10-CM | POA: Diagnosis not present

## 2021-07-29 LAB — CBC
HCT: 36.4 % (ref 36.0–46.0)
Hemoglobin: 11.3 g/dL — ABNORMAL LOW (ref 12.0–15.0)
MCH: 28.4 pg (ref 26.0–34.0)
MCHC: 31 g/dL (ref 30.0–36.0)
MCV: 91.5 fL (ref 80.0–100.0)
Platelets: 219 10*3/uL (ref 150–400)
RBC: 3.98 MIL/uL (ref 3.87–5.11)
RDW: 13.4 % (ref 11.5–15.5)
WBC: 6.9 10*3/uL (ref 4.0–10.5)
nRBC: 0 % (ref 0.0–0.2)

## 2021-07-29 LAB — BASIC METABOLIC PANEL
Anion gap: 8 (ref 5–15)
BUN: 18 mg/dL (ref 8–23)
CO2: 31 mmol/L (ref 22–32)
Calcium: 9.9 mg/dL (ref 8.9–10.3)
Chloride: 100 mmol/L (ref 98–111)
Creatinine, Ser: 0.74 mg/dL (ref 0.44–1.00)
GFR, Estimated: >60 mL/min (ref >60)
Glucose, Bld: 117 mg/dL — ABNORMAL HIGH (ref 70–99)
Potassium: 3.8 mmol/L (ref 3.5–5.1)
Sodium: 139 mmol/L (ref 135–145)

## 2021-07-29 LAB — URINALYSIS, ROUTINE W REFLEX MICROSCOPIC
Bilirubin Urine: NEGATIVE
Glucose, UA: NEGATIVE mg/dL
Hgb urine dipstick: NEGATIVE
Ketones, ur: NEGATIVE mg/dL
Leukocytes,Ua: NEGATIVE
Nitrite: NEGATIVE
Protein, ur: NEGATIVE mg/dL
Specific Gravity, Urine: 1.019 (ref 1.005–1.030)
pH: 7.5 (ref 5.0–8.0)

## 2021-07-29 LAB — TROPONIN I (HIGH SENSITIVITY)
Troponin I (High Sensitivity): 4 ng/L
Troponin I (High Sensitivity): 4 ng/L (ref <18)

## 2021-07-29 MED ORDER — ACETAMINOPHEN 325 MG PO TABS
650.0000 mg | ORAL_TABLET | Freq: Once | ORAL | Status: AC
Start: 2021-07-29 — End: 2021-07-29
  Administered 2021-07-29: 650 mg via ORAL
  Filled 2021-07-29: qty 2

## 2021-07-29 NOTE — Discharge Instructions (Addendum)
Call your primary care doctor or specialist as discussed in the next 2-3 days.   Return immediately back to the ER if:  Your symptoms worsen within the next 12-24 hours. You develop new symptoms such as new fevers, persistent vomiting, new pain, shortness of breath, or new weakness or numbness, or if you have any other concerns.  

## 2021-07-29 NOTE — Telephone Encounter (Signed)
Patients daughter called to report that patient had taken her liquid Keppra yesterday and got choked on the medication.  She states that she has been rather raspy since that episode.  This morning she complained of chest pain and a sudden sharp pain shooting into her head.    She questioned if she should take her to the emergency room.  Advised yes due to complaint of chest pain.

## 2021-07-29 NOTE — ED Triage Notes (Signed)
Patient reports to the ER for chest pains that she reports felt sharp in nature at around 0700 this morning. Patient endorses aspirating her anti-seizure medication on Saturday and states she has had a significant headache. Hx of brain cancer and lung cancer.

## 2021-07-29 NOTE — ED Provider Notes (Signed)
[facility] EMERGENCY DEPT Provider Note   CSN: [id number] Arrival date & time: 07/29/21  3532     History  Chief Complaint  Patient presents with   Chest Pain    Hannah [name] is a 71 y.o. female.  Patient reports a chief complaint of chest pain.  She does woke her up around 7 AM describes a sharp mid chest pain last about 15 to 30 minutes and it has resolved.  She no longer has any chest discomfort.  No associated fevers or vomiting or diarrhea.  She did have an episode of a headache that lasted about 5 to 10 seconds and then resolved.  Family states that 2 days ago she was drinking her oral Keppra and she choked on a little bit of it and has had a mild cough since then.      Home Medications Prior to Admission medications   Medication Sig Start Date End Date Taking? Authorizing Provider  acetaminophen (TYLENOL) 500 MG tablet Take 2 tablets (1,000 mg total) by mouth every 6 (six) hours. 09/28/20   Elgie Collard, PA-C  ALPRAZolam (XANAX XR) 0.5 MG 24 hr tablet Take one tablet (0.5 mg total) by mouth every night at bedtime. 07/24/21   Plovsky, Berneta Sages, MD  ALPRAZolam Duanne Moron) 0.5 MG tablet Take one tablet (0.5 mg total) by mouth qam and may take one more tablet (0.5 mg total) qd prn for anxiety. 07/24/21   Plovsky, Berneta Sages, MD  Ascorbic Acid (VITAMIN C) 1000 MG tablet Take 1,000 mg by mouth daily.    [provider]  aspirin 81 MG tablet Take 1 tablet (81 mg total) by mouth daily. Restart on 08/31/20 08/25/20   Judith Part, MD  bisacodyl (DULCOLAX) 5 MG EC tablet Take 5 mg by mouth daily as needed for moderate constipation. Patient not taking: Reported on 07/23/2021    [provider]  carboxymethylcellulose (REFRESH PLUS) 0.5 % SOLN Place 1-2 drops into both eyes 3 (three) times daily as needed (dry eyes).    [provider]  cephALEXin (KEFLEX) 500 MG capsule Take 1 capsule (500 mg total) by mouth 2 (two) times daily. 07/18/21   [name], MD  Cholecalciferol (D3 PO) Take 1 capsule by mouth daily.    [provider]  citalopram (CELEXA) 10 MG tablet Take 1 tablet (10 mg total) by mouth daily. 07/24/21 07/24/22  Plovsky, Berneta Sages, MD  dexamethasone (DECADRON) 1 MG tablet Take 1 tablet (1 mg total) by mouth daily. 07/23/21   Ventura Sellers, MD  folic acid (FOLVITE) 1 MG tablet Take 1 tablet (1 mg total) by mouth daily. 06/12/21   Heilingoetter, Cassandra L, PA-C  Lacosamide 100 MG TABS Take 1 tablet (100 mg total) by mouth 2 (two) times daily. 06/20/21   Ventura Sellers, MD  levETIRAcetam (KEPPRA) 100 MG/ML solution Take 15 mLs (1,500 mg total) by mouth 2 (two) times daily. 05/27/21   Ventura Sellers, MD  loratadine (CLARITIN) 10 MG tablet Take 10 mg by mouth daily as needed for allergies. Patient not taking: Reported on 07/23/2021    [provider]  Menthol, Topical Analgesic, (BIOFREEZE EX) Apply 1 application topically as needed (pain). Patient not taking: Reported on 07/23/2021    [provider]  mirtazapine (REMERON) 45 MG tablet Take 1 tablet (45 mg total) by mouth at bedtime. 07/24/21   Plovsky, Berneta Sages, MD  olopatadine (PATANOL) 0.1 % ophthalmic solution Place 1 drop into both eyes 2 (two) times  daily as needed for allergies. Patient not taking: Reported on 07/23/2021    [provider]  omeprazole (PRILOSEC) 40 MG capsule TAKE 1 CAPSULE (40 MG TOTAL) BY MOUTH 2 (TWO) TIMES DAILY BEFORE A MEAL. Patient taking differently: Take 40 mg by mouth in the morning and at bedtime. 01/31/21   [name], MD  polyethylene glycol (MIRALAX / GLYCOLAX) 17 g packet Take 17 g by mouth 2 (two) times daily. 08/19/20   Eugenie Filler, MD  pravastatin (PRAVACHOL) 40 MG tablet TAKE 1 TABLET (40 MG TOTAL) BY MOUTH EVERY EVENING. 01/31/21   [name], MD  Probiotic Product (PROBIOTIC DAILY PO) Take 1 capsule by mouth daily.    [provider]  prochlorperazine (COMPAZINE) 10 MG tablet Take 1  tablet (10 mg total) by mouth every 6 (six) hours as needed for nausea or vomiting. Patient not taking: Reported on 07/23/2021 10/23/20   Curt Bears, MD  simethicone [facility]) 80 MG chewable tablet Chew 1 tablet (80 mg total) by mouth 4 (four) times daily as needed for flatulence (Bloating). 09/28/20   Elgie Collard, PA-C      Allergies    Amlodipine, Chantix [varenicline tartrate], Clarithromycin, Lisinopril, Simvastatin, Wellbutrin [bupropion hcl], Lipitor [atorvastatin], and Sertraline    Review of Systems   Review of Systems  Constitutional:  Negative for fever.  HENT:  Negative for ear pain.   Eyes:  Negative for pain.  Respiratory:  Positive for cough.   Cardiovascular:  Positive for chest pain.  Gastrointestinal:  Negative for abdominal pain.  Genitourinary:  Negative for flank pain.  Musculoskeletal:  Negative for back pain.  Skin:  Negative for rash.  Neurological:  Negative for headaches.   Physical Exam Updated Vital Signs BP 131/74    Pulse 67    Temp 98.7 F (37.1 C) (Oral)    Resp 18    Ht 5' (1.524 m)    Wt 59 kg    SpO2 100%    BMI 25.39 kg/m  Physical Exam Constitutional:      General: She is not in acute distress.    Appearance: Normal appearance.  HENT:     Head: Normocephalic.     Nose: Nose normal.  Eyes:     Extraocular Movements: Extraocular movements intact.  Cardiovascular:     Rate and Rhythm: Normal rate.  Pulmonary:     Effort: Pulmonary effort is normal.  Musculoskeletal:        General: Normal range of motion.     Cervical back: Normal range of motion.  Neurological:     General: No focal deficit present.     Mental Status: She is alert and oriented to person, place, and time. Mental status is at baseline.     Cranial Nerves: No cranial nerve deficit.     Motor: No weakness.    ED Results / Procedures / Treatments   Labs (all labs ordered are listed, but only abnormal results are displayed) Labs Reviewed  BASIC METABOLIC PANEL -  Abnormal; Notable for the following components:      Result Value   Glucose, Bld 117 (*)    All other components within normal limits  CBC - Abnormal; Notable for the following components:   Hemoglobin 11.3 (*)    All other components within normal limits  URINALYSIS, ROUTINE W REFLEX MICROSCOPIC - Abnormal; Notable for the following components:   APPearance HAZY (*)    All other components within normal limits  TROPONIN  I (HIGH SENSITIVITY)  TROPONIN I (HIGH SENSITIVITY)    EKG None  Radiology DG Chest 2 View  Result Date: 07/29/2021 CLINICAL DATA:  Provided history: Chest pain. EXAM: CHEST - 2 VIEW COMPARISON:  Prior chest radiographs 05/07/2021 and earlier. Chest CT 06/10/2021. FINDINGS: Heart size within normal limits. Stable postoperative changes from prior right upper lobe resection. Persistent trace right pleural effusion. No appreciable airspace consolidation. No evidence of pneumothorax. No acute bony abnormality identified. Levocurvature of the upper thoracic spine. Partially imaged spinal fusion hardware within the lumbar spine. IMPRESSION: Stable postoperative changes from prior right upper lobe resection. Persistent trace right pleural effusion. No appreciable airspace consolidation. Electronically Signed   By: Kellie Simmering D.O.   On: 07/29/2021 10:51    Procedures Procedures    Medications Ordered in ED Medications  acetaminophen (TYLENOL) tablet 650 mg (650 mg Oral Given 07/29/21 1322)    ED Course/ Medical Decision Making/ A&P                           Medical Decision Making Amount and/or Complexity of Data Reviewed Independent Historian: caregiver External Data Reviewed: notes.    Details: Review of records show patient saw her oncologist on July 22, 2021. Labs: ordered.    Details: Unremarkable white count normal chemistry normal. Radiology: ordered.    Details: Unchanged from prior imaging per radiology. ECG/medicine tests: ordered and independent  interpretation performed. Decision-making details documented in ED Course.  Risk OTC drugs. Risk Details: Patient with history of lung cancer with brain metastases, presents with chest pain.  Symptoms have resolved and she is symptom-free at this time and work-up is negative with 2 sets of serial troponins are negative.  EKG shows sinus rhythm no ST elevation depressions no T wave inversions noted.  Recommending outpatient follow-up with her doctor within the week.  Pulmonary embolism considered but I doubt this diagnosis given normal heart rate, resolution of symptoms and negative work-up thus far.  Given instructions for immediate return for worsening symptoms or any additional concerns.            Final Clinical Impression(s) / ED Diagnoses Final diagnoses:  Chest pain, unspecified type    Rx / DC Orders ED Discharge Orders     None         Luna Fuse, MD 07/29/21 1330

## 2021-07-30 ENCOUNTER — Other Ambulatory Visit: Payer: Self-pay | Admitting: Radiation Therapy

## 2021-07-31 ENCOUNTER — Other Ambulatory Visit: Payer: Medicare HMO

## 2021-08-01 ENCOUNTER — Telehealth: Payer: Self-pay | Admitting: Internal Medicine

## 2021-08-01 ENCOUNTER — Ambulatory Visit (INDEPENDENT_AMBULATORY_CARE_PROVIDER_SITE_OTHER): Payer: Medicare HMO | Admitting: *Deleted

## 2021-08-01 DIAGNOSIS — C3411 Malignant neoplasm of upper lobe, right bronchus or lung: Secondary | ICD-10-CM

## 2021-08-01 DIAGNOSIS — C7931 Secondary malignant neoplasm of brain: Secondary | ICD-10-CM

## 2021-08-01 DIAGNOSIS — W19XXXA Unspecified fall, initial encounter: Secondary | ICD-10-CM

## 2021-08-01 NOTE — Telephone Encounter (Signed)
Scheduled per los, spoke with patient's daughter. Patient will be notified of upcoming appointments.

## 2021-08-01 NOTE — Chronic Care Management (AMB) (Signed)
Chronic Care Management   CCM RN Visit Note  08/01/2021 Name: Hannah [name] MRN: [id number] DOB: [birth year]  Subjective: Hannah [name] is a 71 y.o. year old female who is a primary care patient of Burns, Claudina Lick, MD. The care management team was consulted for assistance with disease management and care coordination needs.    Engaged with patient's caregiver/ daughter Hannah [name], on [place] by telephone for follow up visit in response to provider referral for case management and/or care coordination services.   Consent to Services:  The patient was given information about Chronic Care Management services, agreed to services, and gave verbal consent prior to initiation of services.  Please see initial visit note for detailed documentation.  Patient agreed to services and verbal consent obtained.   Assessment: Review of patient past medical history, allergies, medications, health status, including review of consultants reports, laboratory and other test data, was performed as part of comprehensive evaluation and provision of chronic care management services.   SDOH (Social Determinants of Health) assessments and interventions performed:  SDOH Interventions    Flowsheet Row Most Recent Value  SDOH Interventions   Food Insecurity Interventions Intervention Not Indicated  [caregiver/ daughter denies food insecurity]  Transportation Interventions Intervention Not Indicated  [Family continues to provide transportation]      CCM Care Plan  Allergies  Allergen Reactions   Amlodipine Swelling and Rash    Rash, swelling   Chantix [Varenicline Tartrate] Shortness Of Breath, Swelling and Other (See Comments)    Tongue swell,sob   Clarithromycin Rash   Lisinopril Hives   Simvastatin Hives   Wellbutrin [Bupropion Hcl] Hives   Lipitor [Atorvastatin]     Dizziness per patient   Sertraline     Makes her feel like she is going to kill someone   Outpatient Encounter Medications as of 08/01/2021   Medication Sig   acetaminophen (TYLENOL) 500 MG tablet Take 2 tablets (1,000 mg total) by mouth every 6 (six) hours.   ALPRAZolam (XANAX XR) 0.5 MG 24 hr tablet Take one tablet (0.5 mg total) by mouth every night at bedtime.   ALPRAZolam (XANAX) 0.5 MG tablet Take one tablet (0.5 mg total) by mouth qam and may take one more tablet (0.5 mg total) qd prn for anxiety.   Ascorbic Acid (VITAMIN C) 1000 MG tablet Take 1,000 mg by mouth daily.   aspirin 81 MG tablet Take 1 tablet (81 mg total) by mouth daily. Restart on 08/31/20   bisacodyl (DULCOLAX) 5 MG EC tablet Take 5 mg by mouth daily as needed for moderate constipation. (Patient not taking: Reported on 07/23/2021)   carboxymethylcellulose (REFRESH PLUS) 0.5 % SOLN Place 1-2 drops into both eyes 3 (three) times daily as needed (dry eyes).   cephALEXin (KEFLEX) 500 MG capsule Take 1 capsule (500 mg total) by mouth 2 (two) times daily.   Cholecalciferol (D3 PO) Take 1 capsule by mouth daily.   citalopram (CELEXA) 10 MG tablet Take 1 tablet (10 mg total) by mouth daily.   dexamethasone (DECADRON) 1 MG tablet Take 1 tablet (1 mg total) by mouth daily. (Patient not taking: Reported on 10/05/8889)   folic acid (FOLVITE) 1 MG tablet Take 1 tablet (1 mg total) by mouth daily.   Lacosamide 100 MG TABS Take 1 tablet (100 mg total) by mouth 2 (two) times daily.   levETIRAcetam (KEPPRA) 100 MG/ML solution Take 15 mLs (1,500 mg total) by mouth 2 (two) times daily.   loratadine (CLARITIN) 10 MG tablet  Take 10 mg by mouth daily as needed for allergies. (Patient not taking: Reported on 07/23/2021)   Menthol, Topical Analgesic, (BIOFREEZE EX) Apply 1 application topically as needed (pain). (Patient not taking: Reported on 07/23/2021)   mirtazapine (REMERON) 45 MG tablet Take 1 tablet (45 mg total) by mouth at bedtime.   olopatadine (PATANOL) 0.1 % ophthalmic solution Place 1 drop into both eyes 2 (two) times daily as needed for allergies. (Patient not taking: Reported on  07/23/2021)   omeprazole (PRILOSEC) 40 MG capsule TAKE 1 CAPSULE (40 MG TOTAL) BY MOUTH 2 (TWO) TIMES DAILY BEFORE A MEAL. (Patient taking differently: Take 40 mg by mouth in the morning and at bedtime.)   polyethylene glycol (MIRALAX / GLYCOLAX) 17 g packet Take 17 g by mouth 2 (two) times daily.   pravastatin (PRAVACHOL) 40 MG tablet TAKE 1 TABLET (40 MG TOTAL) BY MOUTH EVERY EVENING.   Probiotic Product (PROBIOTIC DAILY PO) Take 1 capsule by mouth daily.   prochlorperazine (COMPAZINE) 10 MG tablet Take 1 tablet (10 mg total) by mouth every 6 (six) hours as needed for nausea or vomiting. (Patient not taking: Reported on 07/23/2021)   simethicone (MYLICON) 80 MG chewable tablet Chew 1 tablet (80 mg total) by mouth 4 (four) times daily as needed for flatulence (Bloating).   No facility-administered encounter medications on file as of 08/01/2021.   Patient Active Problem List   Diagnosis Date Noted   Acute cystitis 07/16/2021   Acute encephalopathy 04/22/2021   Vasogenic brain edema ([place]) 04/22/2021   Seizure ([place]) 04/22/2021   Nonrheumatic aortic valve insufficiency 04/18/2021   Sinusitis 02/21/2021   Allergic rhinitis 02/21/2021   Encounter for antineoplastic chemotherapy 10/23/2020   Encounter for antineoplastic immunotherapy 10/23/2020   Anemia 10/19/2020   Sleep difficulties 10/19/2020   S/P robot-assisted surgical procedure 09/24/2020   Aortic atherosclerosis ([place]) 09/12/2020   Lung mass 09/04/2020   Status post craniotomy 08/24/2020   Brain tumor ([place]) 08/24/2020   Hematochezia 08/19/2020   Acute blood loss anemia    Rectal bleeding 08/17/2020   Primary cancer of right upper lobe of lung ([place]) 08/07/2020   Brain metastasis ([place]) 08/06/2020   Memory changes 07/24/2020   Non-recurrent unilateral inguinal hernia without obstruction or gangrene 07/24/2020   Pain of right clavicle 09/12/2019   Nonspecific abnormal electrocardiogram (ECG) (EKG) 10/20/2018   Educated about COVID-19  virus infection 10/20/2018   Depression- Dr Casimiro Needle 07/17/2018   Primary osteoarthritis of right knee 06/04/2018   Bilateral carotid artery stenosis 09/24/2017   Aortic valve sclerosis 09/24/2017   Vocal cord polyps 09/07/2017   Dysphagia 09/07/2017   Diabetes mellitus without complication ([place]) 08/65/7846   GERD (gastroesophageal reflux disease) 02/27/2016   Anxiety - Dr Casimiro Needle 02/27/2016   S/P total knee replacement 02/13/2014   RBBB 02/08/2014   HTN (hypertension) 02/08/2014   PVD - bilateral 60-79% carotid strenosis 02/08/2014   Mixed hyperlipidemia 11/29/2013   Carpal tunnel syndrome, bilateral 11/23/2013   Murmur- mild -mod AR, mild MR 11/10/2013   Constipation 12/16/2012   Conditions to be addressed/monitored:  HTN and cancer  Care Plan : RN Care Manager Plan of Care  Updates made by Knox Royalty, RN since 08/01/2021 12:00 AM     Problem: Chronic Disease Management Needs   Priority: Medium     Long-Range Goal: Ongoing adherence to established plan of care for long-term chronic disease management   Start Date: 12/27/2020  Expected End Date: 12/27/2020  Priority: Medium  Note:   Current  Barriers:  Chronic Disease Management support and education needs related to HTN and Cancer Recent surgery x 2 for cancer treatment- ongoing recuperation/ pain; recent metastases to brain  Recent hospitalization October 24-30, 2022 for cerebral edema: discharged home to self-care Recent ED visit 07/29/21 for chest discomfort- discharged home 08/01/21: At least one new/ recent fall, without injury: caregiver reports patient does not require use of assistive devices  RNCM Clinical Goal(s):  Patient will demonstrate ongoing health management independence for HTN/ cancer  through collaboration with RN Care manager, provider, and care team.   Interventions: 1:1 collaboration with primary care provider regarding development and update of comprehensive plan of care as evidenced by provider  attestation and co-signature Inter-disciplinary care team collaboration (see longitudinal plan of care) Evaluation of current treatment plan related to  self management and patient's adherence to plan as established by provider Reviewed October 2022 inpatient hospital visit with caregiver Confirmed patient/ caregiver continue to receive excellent support from oncology team Reviewed recent fall episodes/ fall assessment updated: caregiver denies injury from patient fall, reports patient ambulating well without assistive devices: previously provided education around fall risk/ prevention education reinforced Pain assessment updated: caregiver denies that patient is experiencing acute/ chronic pain SDOH/ depression screening updated: no new/ unmet needs identified; confirmed caregiver believes patient's depression/ anxiety is well controlled; continues to be followed by established psychiatric provider, last visit 07/24/21- reviewed with caregiver Confirmed no new/ recent medication changes/ concerns: caregiver reports patient took antibiotics as prescribed for recent UTI; she completed decadron course on Tuesday1/31/23-- caregiver reports "feeling much better already" since decadron therapy has been completed Reviewed recent PCP office visit 07/16/21 for UTI: caregiver reports this has resolved; patient staying well hydrated; reinforced previously provided education around UTI signs/ symptoms along with corresponding action plan, need to stay hydrated Discussed caregiver stress/ coping with daughter-- she denies need for additional resources, states she is handling stress well; has recently began to involved additional family members in providing day-day care for patient and this is helping   Hypertension and lung cancer with mets to brain: (Status: 08/01/21: Goal on track: YES.) Long-Term goal Last practice recorded BP readings:  BP Readings from Last 3 Encounters:  07/29/21 116/76  07/24/21 128/69   07/23/21 133/70  Most recent eGFR/CrCl: No results found for: EGFR  No components found for: CRCL  Evaluation of current treatment plan related to hypertension self management and patient's adherence to plan as established by provider;   Reviewed prescribed diet low salt, heart healthy Counseled on the importance of exercise goals with target of 150 minutes per week Discussed plans with patient for ongoing care management follow up and provided patient with direct contact information for care management team; Chart reviewed including relevant office notes, upcoming scheduled appointments, and lab results Confirmed with caregiver/ daughter that patient has continues monitoring/ recording blood pressures at home: per report, "they have been just fine, no concerns" Confirmed patient remains OFF blood pressure medication Confirmed patient staying active/ mobile: this was encouraged; positive reinforcement provided with encouragement to continue efforts Confirmed patient appetite "very good;" caregiver reports patient continues to slowly gain weight: reports weights now up to 130 lbs (from last reported in September as 114 lbs) Reviewed upcoming provider appointments with caregiver and confirmed that patient has plans to attend all as scheduled; daughter or other family member will transport and attend alongside patient:  rotating chemotherapy / oncology provider appointments: 08/14/21; 09/04/21; 09/25/21; 10/16/21; 08/08/21- CT scan abdomen/ pelvis; 09/18/21- PCP office visit; 09/26/21-  Dr. Mickeal Skinner, oncology;  Discussed plans with patient for ongoing care management follow up and provided patient with direct contact information for care management team  Patient Goals/Self-Care Activities: As evidenced by review of EHR, collaboration with care team, and patient reporting during CCM RN CM outreach,  Patient [place] will: Take medications as prescribed Attend all scheduled provider appointments Call pharmacy  for medication refills Attend church or other social activities Call provider office for new concerns or questions Keep up the great work staying active and following a low-salt, heart healthy diet Continue to occasionally monitor and write down on paper your blood pressures from home Take precautions to prevent falls    Plan: Telephone follow up appointment with care management team member scheduled for:  Wednesday, October 02, 2021 at 10:00 am The patient has been provided with contact information for the care management team and has been advised to call with any health related questions or concerns  [name], RN, BSN, [place] [phone]: direct office

## 2021-08-01 NOTE — Patient Instructions (Signed)
Visit Information  [name] and [name], thank you for taking time to talk with me today. Please don't hesitate to contact me if I can be of assistance to you before our next scheduled telephone appointment.  Below are the goals we discussed today:  Patient Self-Care Activities: Patient Hannah [name] will: Take medications as prescribed Attend all scheduled provider appointments Call pharmacy for medication refills Attend church or other social activities Call provider office for new concerns or questions Keep up the great work staying active and following a low-salt, heart healthy diet Continue to occasionally monitor and write down on paper your blood pressures from home Take precautions to prevent falls  Our next scheduled telephone follow up visit/ appointment with care management team member is scheduled on:   Wednesday, October 02, 2021 at 10:00 am- This is a PHONE [place] appointment  If you need to cancel or re-schedule our visit, please call [phone] and our care guide team will be happy to assist you.   I look forward to hearing about your progress.   [name], RN, BSN, [facility] RN Care Coordination- [place] [phone]: direct office  If you are experiencing a Mental Health or [place] or need someone to talk to, please  call the Suicide and Crisis Lifeline: 988 call the [place] National Suicide Prevention Lifeline: 253-172-0857 or TTY: 450-866-1405 TTY 947-758-4985) to talk to a trained counselor call 1-800-273-TALK (toll free, 24 hour hotline) go to [facility] [place] [phone]) call 911   Patient's caregiver/ daughter verbalizes understanding of instructions and care plan provided today and agrees to view in [place]. Active MyChart status confirmed with caregiver  Urinary Tract Infection, Adult A urinary tract infection (UTI) is an infection of any part of the  urinary tract. The urinary tract includes the kidneys, ureters, bladder, and urethra. These organs make, store, and get rid of urine in the body. An upper UTI affects the ureters and kidneys. A lower UTI affects the bladder and urethra. What are the causes? Most urinary tract infections are caused by bacteria in your genital area around your urethra, where urine leaves your body. These bacteria grow and cause inflammation of your urinary tract. What increases the risk? You are more likely to develop this condition if: You have a urinary catheter that stays in place. You are not able to control when you urinate or have a bowel movement (incontinence). You are female and you: Use a spermicide or diaphragm for birth control. Have low estrogen levels. Are pregnant. You have certain genes that increase your risk. You are sexually active. You take antibiotic medicines. You have a condition that causes your flow of urine to slow down, such as: An enlarged prostate, if you are female. Blockage in your urethra. A kidney stone. A nerve condition that affects your bladder control (neurogenic bladder). Not getting enough to drink, or not urinating often. You have certain medical conditions, such as: Diabetes. A weak disease-fighting system (immunesystem). Sickle cell disease. Gout. Spinal cord injury. What are the signs or symptoms? Symptoms of this condition include: Needing to urinate right away (urgency). Frequent urination. This may include small amounts of urine each time you urinate. Pain or burning with urination. Blood in the urine. Urine that smells bad or unusual. Trouble urinating. Cloudy urine. Vaginal discharge, if you are female. Pain in the abdomen or the lower back. You may also have: Vomiting or a decreased appetite. Confusion. Irritability  or tiredness. A fever or chills. Diarrhea. The first symptom in older adults may be confusion. In some cases, they may not have any  symptoms until the infection has worsened. How is this diagnosed? This condition is diagnosed based on your medical history and a physical exam. You may also have other tests, including: Urine tests. Blood tests. Tests for STIs (sexually transmitted infections). If you have had more than one UTI, a cystoscopy or imaging studies may be done to determine the cause of the infections. How is this treated? Treatment for this condition includes: Antibiotic medicine. Over-the-counter medicines to treat discomfort. Drinking enough water to stay hydrated. If you have frequent infections or have other conditions such as a kidney stone, you may need to see a health care provider who specializes in the urinary tract (urologist). In rare cases, urinary tract infections can cause sepsis. Sepsis is a life-threatening condition that occurs when the body responds to an infection. Sepsis is treated in the hospital with IV antibiotics, fluids, and other medicines. Follow these instructions at home: Medicines Take over-the-counter and prescription medicines only as told by your health care provider. If you were prescribed an antibiotic medicine, take it as told by your health care provider. Do not stop using the antibiotic even if you start to feel better. General instructions Make sure you: Empty your bladder often and completely. Do not hold urine for long periods of time. Empty your bladder after sex. Wipe from front to back after urinating or having a bowel movement if you are female. Use each tissue only one time when you wipe. Drink enough fluid to keep your urine pale yellow. Keep all follow-up visits. This is important. Contact a health care provider if: Your symptoms do not get better after 1-2 days. Your symptoms go away and then return. Get help right away if: You have severe pain in your back or your lower abdomen. You have a fever or chills. You have nausea or vomiting. Summary A urinary  tract infection (UTI) is an infection of any part of the urinary tract, which includes the kidneys, ureters, bladder, and urethra. Most urinary tract infections are caused by bacteria in your genital area. Treatment for this condition often includes antibiotic medicines. If you were prescribed an antibiotic medicine, take it as told by your health care provider. Do not stop using the antibiotic even if you start to feel better. Keep all follow-up visits. This is important. This information is not intended to replace advice given to you by your health care provider. Make sure you discuss any questions you have with your health care provider. Document Revised: 01/27/2020 Document Reviewed: 01/27/2020 [employer] Patient Education  [place].

## 2021-08-08 ENCOUNTER — Ambulatory Visit
Admission: RE | Admit: 2021-08-08 | Discharge: 2021-08-08 | Disposition: A | Discharge status: Home / Self Care | Payer: Medicare HMO | Source: Ambulatory Visit | Attending: Internal Medicine | Admitting: Internal Medicine

## 2021-08-08 ENCOUNTER — Other Ambulatory Visit: Payer: Medicare HMO

## 2021-08-08 DIAGNOSIS — I700 Atherosclerosis of aorta: Secondary | ICD-10-CM | POA: Diagnosis not present

## 2021-08-08 DIAGNOSIS — I2510 Atherosclerotic heart disease of native coronary artery without angina pectoris: Secondary | ICD-10-CM | POA: Diagnosis not present

## 2021-08-08 DIAGNOSIS — C3491 Malignant neoplasm of unspecified part of right bronchus or lung: Secondary | ICD-10-CM | POA: Diagnosis not present

## 2021-08-08 DIAGNOSIS — J984 Other disorders of lung: Secondary | ICD-10-CM | POA: Diagnosis not present

## 2021-08-08 DIAGNOSIS — J90 Pleural effusion, not elsewhere classified: Secondary | ICD-10-CM | POA: Diagnosis not present

## 2021-08-08 DIAGNOSIS — C3490 Malignant neoplasm of unspecified part of unspecified bronchus or lung: Secondary | ICD-10-CM

## 2021-08-08 DIAGNOSIS — Z902 Acquired absence of lung [part of]: Secondary | ICD-10-CM | POA: Diagnosis not present

## 2021-08-08 MED ORDER — IOPAMIDOL (ISOVUE-300) INJECTION 61%
100.0000 mL | Freq: Once | INTRAVENOUS | Status: AC | PRN
  Administered 2021-08-08: 100 mL via INTRAVENOUS

## 2021-08-14 ENCOUNTER — Inpatient Hospital Stay: Payer: Medicare HMO | Attending: Internal Medicine

## 2021-08-14 ENCOUNTER — Inpatient Hospital Stay: Payer: Medicare HMO

## 2021-08-14 ENCOUNTER — Telehealth: Payer: Self-pay

## 2021-08-14 ENCOUNTER — Inpatient Hospital Stay (HOSPITAL_BASED_OUTPATIENT_CLINIC_OR_DEPARTMENT_OTHER): Payer: Medicare HMO | Admitting: Internal Medicine

## 2021-08-14 ENCOUNTER — Other Ambulatory Visit: Payer: Self-pay

## 2021-08-14 ENCOUNTER — Encounter: Payer: Self-pay | Admitting: Internal Medicine

## 2021-08-14 ENCOUNTER — Other Ambulatory Visit: Payer: Medicare HMO

## 2021-08-14 VITALS — BP 141/74 | HR 87 | Temp 97.8°F | Resp 19 | Ht 60.0 in | Wt 133.9 lb

## 2021-08-14 DIAGNOSIS — C7931 Secondary malignant neoplasm of brain: Secondary | ICD-10-CM | POA: Insufficient documentation

## 2021-08-14 DIAGNOSIS — Z79899 Other long term (current) drug therapy: Secondary | ICD-10-CM | POA: Diagnosis not present

## 2021-08-14 DIAGNOSIS — F419 Anxiety disorder, unspecified: Secondary | ICD-10-CM | POA: Insufficient documentation

## 2021-08-14 DIAGNOSIS — Z5112 Encounter for antineoplastic immunotherapy: Secondary | ICD-10-CM | POA: Diagnosis not present

## 2021-08-14 DIAGNOSIS — Z87891 Personal history of nicotine dependence: Secondary | ICD-10-CM | POA: Insufficient documentation

## 2021-08-14 DIAGNOSIS — I2510 Atherosclerotic heart disease of native coronary artery without angina pectoris: Secondary | ICD-10-CM | POA: Diagnosis not present

## 2021-08-14 DIAGNOSIS — C3411 Malignant neoplasm of upper lobe, right bronchus or lung: Secondary | ICD-10-CM | POA: Diagnosis not present

## 2021-08-14 DIAGNOSIS — Z5111 Encounter for antineoplastic chemotherapy: Secondary | ICD-10-CM | POA: Insufficient documentation

## 2021-08-14 DIAGNOSIS — I10 Essential (primary) hypertension: Secondary | ICD-10-CM | POA: Insufficient documentation

## 2021-08-14 DIAGNOSIS — Z8719 Personal history of other diseases of the digestive system: Secondary | ICD-10-CM | POA: Insufficient documentation

## 2021-08-14 DIAGNOSIS — Z981 Arthrodesis status: Secondary | ICD-10-CM | POA: Insufficient documentation

## 2021-08-14 DIAGNOSIS — Z888 Allergy status to other drugs, medicaments and biological substances status: Secondary | ICD-10-CM | POA: Insufficient documentation

## 2021-08-14 DIAGNOSIS — J984 Other disorders of lung: Secondary | ICD-10-CM | POA: Insufficient documentation

## 2021-08-14 DIAGNOSIS — F32A Depression, unspecified: Secondary | ICD-10-CM | POA: Diagnosis not present

## 2021-08-14 DIAGNOSIS — G936 Cerebral edema: Secondary | ICD-10-CM | POA: Insufficient documentation

## 2021-08-14 DIAGNOSIS — Z902 Acquired absence of lung [part of]: Secondary | ICD-10-CM | POA: Insufficient documentation

## 2021-08-14 DIAGNOSIS — E785 Hyperlipidemia, unspecified: Secondary | ICD-10-CM | POA: Insufficient documentation

## 2021-08-14 DIAGNOSIS — R300 Dysuria: Secondary | ICD-10-CM | POA: Diagnosis not present

## 2021-08-14 DIAGNOSIS — J90 Pleural effusion, not elsewhere classified: Secondary | ICD-10-CM | POA: Insufficient documentation

## 2021-08-14 DIAGNOSIS — I700 Atherosclerosis of aorta: Secondary | ICD-10-CM | POA: Insufficient documentation

## 2021-08-14 LAB — URINALYSIS, ROUTINE W REFLEX MICROSCOPIC
Bilirubin Urine: NEGATIVE
Hgb urine dipstick: NEGATIVE
Ketones, ur: NEGATIVE
Leukocytes,Ua: NEGATIVE
Nitrite: NEGATIVE
RBC / HPF: NONE SEEN (ref 0–?)
Specific Gravity, Urine: 1.01 (ref 1.000–1.030)
Total Protein, Urine: NEGATIVE
Urine Glucose: NEGATIVE
Urobilinogen, UA: 0.2 (ref 0.0–1.0)
pH: 6 (ref 5.0–8.0)

## 2021-08-14 LAB — CBC WITH DIFFERENTIAL (CANCER CENTER ONLY)
Abs Immature Granulocytes: 0.04 10*3/uL (ref 0.00–0.07)
Basophils Absolute: 0 10*3/uL (ref 0.0–0.1)
Basophils Relative: 1 %
Eosinophils Absolute: 0 10*3/uL (ref 0.0–0.5)
Eosinophils Relative: 1 %
HCT: 32.9 % — ABNORMAL LOW (ref 36.0–46.0)
Hemoglobin: 10.8 g/dL — ABNORMAL LOW (ref 12.0–15.0)
Immature Granulocytes: 1 %
Lymphocytes Relative: 42 %
Lymphs Abs: 1.7 10*3/uL (ref 0.7–4.0)
MCH: 28.6 pg (ref 26.0–34.0)
MCHC: 32.8 g/dL (ref 30.0–36.0)
MCV: 87.3 fL (ref 80.0–100.0)
Monocytes Absolute: 0.5 10*3/uL (ref 0.1–1.0)
Monocytes Relative: 12 %
Neutro Abs: 1.8 10*3/uL (ref 1.7–7.7)
Neutrophils Relative %: 43 %
Platelet Count: 286 10*3/uL (ref 150–400)
RBC: 3.77 MIL/uL — ABNORMAL LOW (ref 3.87–5.11)
RDW: 13.2 % (ref 11.5–15.5)
WBC Count: 4.2 10*3/uL (ref 4.0–10.5)
nRBC: 0 % (ref 0.0–0.2)

## 2021-08-14 LAB — CMP (CANCER CENTER ONLY)
ALT: 33 U/L (ref 0–44)
AST: 34 U/L (ref 15–41)
Albumin: 3.8 g/dL (ref 3.5–5.0)
Alkaline Phosphatase: 60 U/L (ref 38–126)
Anion gap: 6 (ref 5–15)
BUN: 12 mg/dL (ref 8–23)
CO2: 28 mmol/L (ref 22–32)
Calcium: 9.7 mg/dL (ref 8.9–10.3)
Chloride: 107 mmol/L (ref 98–111)
Creatinine: 0.71 mg/dL (ref 0.44–1.00)
GFR, Estimated: >60 mL/min (ref >60)
Glucose, Bld: 122 mg/dL — ABNORMAL HIGH (ref 70–99)
Potassium: 3.9 mmol/L (ref 3.5–5.1)
Sodium: 141 mmol/L (ref 135–145)
Total Bilirubin: 0.2 mg/dL — ABNORMAL LOW (ref 0.3–1.2)
Total Protein: 6.7 g/dL (ref 6.5–8.1)

## 2021-08-14 MED ORDER — SODIUM CHLORIDE 0.9 % IV SOLN
800.0000 mg | Freq: Once | INTRAVENOUS | Status: AC
  Administered 2021-08-14: 800 mg via INTRAVENOUS
  Filled 2021-08-14: qty 20

## 2021-08-14 MED ORDER — SODIUM CHLORIDE 0.9 % IV SOLN
200.0000 mg | Freq: Once | INTRAVENOUS | Status: AC
  Administered 2021-08-14: 200 mg via INTRAVENOUS
  Filled 2021-08-14: qty 200

## 2021-08-14 MED ORDER — SODIUM CHLORIDE 0.9 % IV SOLN: Freq: Once | INTRAVENOUS | Status: AC

## 2021-08-14 MED ORDER — PROCHLORPERAZINE MALEATE 10 MG PO TABS
10.0000 mg | ORAL_TABLET | Freq: Once | ORAL | Status: AC
  Administered 2021-08-14: 10 mg via ORAL
  Filled 2021-08-14: qty 1

## 2021-08-14 NOTE — Progress Notes (Signed)
[place] [phone]   [phone]  OFFICE PROGRESS NOTE  [name], MD [place] [address]  DIAGNOSIS: Stage IV non-small cell lung cancer (T, N0, M1C) adenocarcinoma.  The patient presented with a and solitary brain metastasis.  The patient was diagnosed in February 2022.  Biomarker Findings Microsatellite status - MS-Stable Tumor Mutational Burden - 8 Muts/Mb Genomic Findings For a complete list of the genes assayed, please refer to the Appendix. KRAS G12V KEAP1 S224F TP53 P151T 7 Disease relevant genes with no reportable alterations: ALK, BRAF, EGFR, ERBB2, MET, RET, ROS1  PDL1 Expression: 50%    PRIOR THERAPY: 1) SRS to the metastatic brain lesion on 08/23/2020 under the care of Dr. Tammi Klippel and craniotomy under the care of Dr. Zada Finders scheduled for 08/24/2020. 2) S/p robotic assisted right upper lobectomy with en bloc wedge resection of the right middle lobe and lymph node dissection under the care of Dr. Roxan Hockey on September 24, 2020. 3) status post SRS to a new subcentimeter brain lesions under the care of Dr. Lisbeth Renshaw.   CURRENT THERAPY: Systemic chemotherapy with carboplatin for AUC of 5, Alimta 500 Mg/M2 and Keytruda 200 mg IV every 3 weeks.  First dose Nov 07, 2020.  Status post 12 cycles.  Starting from cycle #5 the patient will be on maintenance treatment with Alimta and Keytruda every 3 weeks.   INTERVAL HISTORY: Hannah [name] 71 y.o. female returns to the clinic today for follow-up visit accompanied by family member.  Her daughter was available by FaceTime during the visit.  The patient is feeling fine today with no concerning complaints except for intermittent headache and blurred thoughts started few days ago.  She discontinued her tapered dose of Decadron few weeks ago as recommended by Dr. Mickeal Skinner.  She denied having any current chest pain, shortness of breath, cough or hemoptysis.  She denied having  any nausea, vomiting, diarrhea or constipation.  She has no fever or chills.  She continues to tolerate her treatment with maintenance Alimta and Keytruda fairly well.  She had repeat CT scan of the chest, abdomen pelvis performed recently and she is here for evaluation and discussion of her scan results.   MEDICAL HISTORY: Past Medical History:  Diagnosis Date   Allergy    Anemia    Anxiety    Arthritis    Carpal tunnel syndrome    Constipation    senna C stool softeners help    Depression    Diverticulosis    Dyslipidemia    External hemorrhoids    GERD (gastroesophageal reflux disease)    Heart murmur    mild-moderate AR   Hiatal hernia    Hyperlipidemia    on meds    Hypertension    Internal hemorrhoids    lung ca with brain mets 07/2020   Osteoarthritis    Pre-diabetes    PVD (peripheral vascular disease) (HCC)    moderate carotid disease   RBBB    Smoker    Vocal cord polyps     ALLERGIES:  is allergic to amlodipine, chantix [varenicline tartrate], clarithromycin, lisinopril, simvastatin, wellbutrin [bupropion hcl], lipitor [atorvastatin], and sertraline.  MEDICATIONS:  Current Outpatient Medications  Medication Sig Dispense Refill   acetaminophen (TYLENOL) 500 MG tablet Take 2 tablets (1,000 mg total) by mouth every 6 (six) hours. 30 tablet 0   ALPRAZolam (XANAX XR) 0.5 MG 24 hr tablet Take one tablet (0.5 mg total) by  mouth every night at bedtime. 30 tablet 5   ALPRAZolam (XANAX) 0.5 MG tablet Take one tablet (0.5 mg total) by mouth qam and may take one more tablet (0.5 mg total) qd prn for anxiety. 120 tablet 5   Ascorbic Acid (VITAMIN C) 1000 MG tablet Take 1,000 mg by mouth daily.     aspirin 81 MG tablet Take 1 tablet (81 mg total) by mouth daily. Restart on 08/31/20 30 tablet    bisacodyl (DULCOLAX) 5 MG EC tablet Take 5 mg by mouth daily as needed for moderate constipation. (Patient not taking: Reported on 07/23/2021)     carboxymethylcellulose (REFRESH PLUS)  0.5 % SOLN Place 1-2 drops into both eyes 3 (three) times daily as needed (dry eyes).     cephALEXin (KEFLEX) 500 MG capsule Take 1 capsule (500 mg total) by mouth 2 (two) times daily. 14 capsule 0   Cholecalciferol (D3 PO) Take 1 capsule by mouth daily.     citalopram (CELEXA) 10 MG tablet Take 1 tablet (10 mg total) by mouth daily. 30 tablet 1   dexamethasone (DECADRON) 1 MG tablet Take 1 tablet (1 mg total) by mouth daily. (Patient not taking: Reported on 08/01/2021) 60 tablet 1   folic acid (FOLVITE) 1 MG tablet Take 1 tablet (1 mg total) by mouth daily. 30 tablet 2   Lacosamide 100 MG TABS Take 1 tablet (100 mg total) by mouth 2 (two) times daily. 60 tablet 3   levETIRAcetam (KEPPRA) 100 MG/ML solution Take 15 mLs (1,500 mg total) by mouth 2 (two) times daily. 473 mL 6   loratadine (CLARITIN) 10 MG tablet Take 10 mg by mouth daily as needed for allergies. (Patient not taking: Reported on 07/23/2021)     Menthol, Topical Analgesic, (BIOFREEZE EX) Apply 1 application topically as needed (pain). (Patient not taking: Reported on 07/23/2021)     mirtazapine (REMERON) 45 MG tablet Take 1 tablet (45 mg total) by mouth at bedtime. 30 tablet 3   olopatadine (PATANOL) 0.1 % ophthalmic solution Place 1 drop into both eyes 2 (two) times daily as needed for allergies. (Patient not taking: Reported on 07/23/2021)     omeprazole (PRILOSEC) 40 MG capsule TAKE 1 CAPSULE (40 MG TOTAL) BY MOUTH 2 (TWO) TIMES DAILY BEFORE A MEAL. (Patient taking differently: Take 40 mg by mouth in the morning and at bedtime.) 180 capsule 0   polyethylene glycol (MIRALAX / GLYCOLAX) 17 g packet Take 17 g by mouth 2 (two) times daily. 72 each 0   pravastatin (PRAVACHOL) 40 MG tablet TAKE 1 TABLET (40 MG TOTAL) BY MOUTH EVERY EVENING. 90 tablet 0   Probiotic Product (PROBIOTIC DAILY PO) Take 1 capsule by mouth daily.     prochlorperazine (COMPAZINE) 10 MG tablet Take 1 tablet (10 mg total) by mouth every 6 (six) hours as needed for nausea  or vomiting. (Patient not taking: Reported on 07/23/2021) 30 tablet 0   simethicone (MYLICON) 80 MG chewable tablet Chew 1 tablet (80 mg total) by mouth 4 (four) times daily as needed for flatulence (Bloating). 30 tablet 0   No current facility-administered medications for this visit.    SURGICAL HISTORY:  Past Surgical History:  Procedure Laterality Date   ABDOMINAL HYSTERECTOMY  2703   APPLICATION OF CRANIAL NAVIGATION N/A 08/24/2020   Procedure: APPLICATION OF CRANIAL NAVIGATION;  Surgeon: Judith Part, MD;  Location: [facility];  Service: Neurosurgery;  Laterality: N/A;   COLONOSCOPY     COLONOSCOPY W/ POLYPECTOMY  2009  CRANIOTOMY Left 08/24/2020   Procedure: Left Craniotomy for Tumor Resection with Brainlab;  Surgeon: Judith Part, MD;  Location: [facility];  Service: Neurosurgery;  Laterality: Left;   HEMORRHOID SURGERY     INTERCOSTAL NERVE BLOCK Right 09/24/2020   Procedure: INTERCOSTAL NERVE BLOCK;  Surgeon: Melrose Nakayama, MD;  Location: [facility];  Service: Thoracic;  Laterality: Right;   JOINT REPLACEMENT     LYMPH NODE DISSECTION Right 09/24/2020   Procedure: LYMPH NODE DISSECTION;  Surgeon: Melrose Nakayama, MD;  Location: [facility];  Service: Thoracic;  Laterality: Right;   POLYPECTOMY     right total knee arthroplasty     Dr. Emeterio Reeve 06-04-18   [place]  08/16/2009   TOTAL KNEE ARTHROPLASTY Left 02/13/2014   Procedure: TOTAL KNEE ARTHROPLASTY;  Surgeon: Alta Corning, MD;  Location: [facility];  Service: Orthopedics;  Laterality: Left;   TOTAL KNEE ARTHROPLASTY Right 06/04/2018   Procedure: RIGHT TOTAL KNEE ARTHROPLASTY;  Surgeon: Dorna Leitz, MD;  Location: [facility];  Service: Orthopedics;  Laterality: Right;  Adductor Block   TUBAL LIGATION      REVIEW OF SYSTEMS:  Constitutional: positive for fatigue Eyes: negative Ears, nose, mouth, throat, and face: negative Respiratory: negative Cardiovascular: negative Gastrointestinal:  negative Genitourinary:negative Integument/breast: negative Hematologic/lymphatic: negative Musculoskeletal:negative Neurological: positive for headaches Behavioral/Psych: negative Endocrine: negative Allergic/Immunologic: negative   PHYSICAL EXAMINATION: General appearance: alert, cooperative, fatigued, and no distress Head: Normocephalic, without obvious abnormality, atraumatic Neck: no adenopathy, no JVD, supple, symmetrical, trachea midline, and thyroid not enlarged, symmetric, no tenderness/mass/nodules Lymph nodes: Cervical, supraclavicular, and axillary nodes normal. Resp: clear to auscultation bilaterally Back: symmetric, no curvature. ROM normal. No CVA tenderness. Cardio: regular rate and rhythm, S1, S2 normal, no murmur, click, rub or gallop GI: soft, non-tender; bowel sounds normal; no masses,  no organomegaly Extremities: extremities normal, atraumatic, no cyanosis or edema Neurologic: Alert and oriented X 3, normal strength and tone. Normal symmetric reflexes. Normal coordination and gait  ECOG PERFORMANCE STATUS: 1 - Symptomatic but completely ambulatory  Blood pressure (!) 141/74, pulse 87, temperature 97.8 F (36.6 C), temperature source Tympanic, resp. rate 19, height 5' (1.524 m), weight 133 lb 14.4 oz (60.7 kg), SpO2 99 %.  LABORATORY DATA: Lab Results  Component Value Date   WBC 4.2 08/14/2021   HGB 10.8 (L) 08/14/2021   HCT 32.9 (L) 08/14/2021   MCV 87.3 08/14/2021   PLT 286 08/14/2021      Chemistry      Component Value Date/Time   NA 141 08/14/2021 1057   K 3.9 08/14/2021 1057   CL 107 08/14/2021 1057   CO2 28 08/14/2021 1057   BUN 12 08/14/2021 1057   CREATININE 0.71 08/14/2021 1057   CREATININE 0.79 03/14/2020 1016      Component Value Date/Time   CALCIUM 9.7 08/14/2021 1057   ALKPHOS 60 08/14/2021 1057   AST 34 08/14/2021 1057   ALT 33 08/14/2021 1057   BILITOT 0.2 (L) 08/14/2021 1057       RADIOGRAPHIC STUDIES: DG Chest 2  View  Result Date: 07/29/2021 CLINICAL DATA:  Provided history: Chest pain. EXAM: CHEST - 2 VIEW COMPARISON:  Prior chest radiographs 05/07/2021 and earlier. Chest CT 06/10/2021. FINDINGS: Heart size within normal limits. Stable postoperative changes from prior right upper lobe resection. Persistent trace right pleural effusion. No appreciable airspace consolidation. No evidence of pneumothorax. No acute bony abnormality identified. Levocurvature of the upper thoracic spine. Partially imaged spinal fusion hardware within the lumbar spine.  IMPRESSION: Stable postoperative changes from prior right upper lobe resection. Persistent trace right pleural effusion. No appreciable airspace consolidation. Electronically Signed   By: Kellie Simmering D.O.   On: 07/29/2021 10:51   CT Chest W Contrast  Result Date: 08/08/2021 CLINICAL DATA:  Metastatic non-small cell lung cancer restaging, status post right upper lobectomy EXAM: CT CHEST, ABDOMEN, AND PELVIS WITH CONTRAST TECHNIQUE: Multidetector CT imaging of the chest, abdomen and pelvis was performed following the standard protocol during bolus administration of intravenous contrast. RADIATION DOSE REDUCTION: This exam was performed according to the departmental dose-optimization program which includes automated exposure control, adjustment of the mA and/or kV according to patient size and/or use of iterative reconstruction technique. CONTRAST:  137m ISOVUE-300 IOPAMIDOL (ISOVUE-300) INJECTION 61%, additional oral enteric contrast COMPARISON:  06/10/2021 FINDINGS: CT CHEST FINDINGS Cardiovascular: Aortic atherosclerosis. Normal heart size. Left coronary artery calcifications. No pericardial effusion. Mediastinum/Nodes: No enlarged mediastinal, hilar, or axillary lymph nodes. Thyroid gland, trachea, and esophagus demonstrate no significant findings. Lungs/Pleura: Status post right upper lobectomy. Unchanged scarring adjacent to a suture line of the right middle lobe (series  3, image 47). Unchanged, trace, loculated right pleural effusion. Occasional tiny pulmonary nodules are unchanged, for example a 0.2 cm nodule of the lingula (series 3, image 75) and a 0.3 cm nodule of the superior segment left lower lobe (series 3, image 69). Musculoskeletal: No chest wall mass or suspicious osseous lesions identified. CT ABDOMEN PELVIS FINDINGS Hepatobiliary: No solid liver abnormality is seen. No gallstones, gallbladder wall thickening, or biliary dilatation. Pancreas: Unremarkable. No pancreatic ductal dilatation or surrounding inflammatory changes. Spleen: Normal in size without significant abnormality. Adrenals/Urinary Tract: Adrenal glands are unremarkable. Kidneys are normal, without renal calculi, solid lesion, or hydronephrosis. Bladder is unremarkable. Stomach/Bowel: Stomach is within normal limits. Appendix appears normal. No evidence of bowel wall thickening, distention, or inflammatory changes. Moderate burden of stool throughout the colon. Vascular/Lymphatic: Aortic atherosclerosis. No enlarged abdominal or pelvic lymph nodes. Reproductive: Status post hysterectomy. Other: No abdominal wall hernia or abnormality. No ascites. Musculoskeletal: No acute osseous findings. IMPRESSION: 1. Status post right upper lobectomy. Unchanged scarring adjacent to a suture line of the right middle lobe. Unchanged, trace, loculated right pleural effusion. No evidence of malignant recurrence. 2. Occasional tiny pulmonary nodules are unchanged, measuring 0.3 cm and smaller. Attention on follow-up. 3. No evidence of lymphadenopathy or metastatic disease in the abdomen or pelvis. 4. Coronary artery disease. Aortic Atherosclerosis (ICD10-I70.0). Electronically Signed   By: ADelanna AhmadiM.D.   On: 08/08/2021 15:01   MR BRAIN W WO CONTRAST  Result Date: 07/19/2021 CLINICAL DATA:  71year old female with metastatic lung cancer status post resection in February 2022 and subsequent SRS to 2 additional small  metastases. Increasing edema and abnormal enhancement in October. Restaging. EXAM: MRI HEAD WITHOUT AND WITH CONTRAST TECHNIQUE: Multiplanar, multiecho pulse sequences of the brain and surrounding structures were obtained without and with intravenous contrast. CONTRAST:  169mMULTIHANCE GADOBENATE DIMEGLUMINE 529 MG/ML IV SOLN COMPARISON:  05/31/2021 and earlier. FINDINGS: Brain: Left superior frontal gyrus resection site patchy enhancement somewhat contiguous with the dura is mildly regressed since October, in an area of 14 x 18 mm now (previously 16 x 18). Regional FLAIR hyperintensity has also regressed with residual on series 7, image 45. This area now has a similar appearance to August of last year. No significant regional mass effect. Abundant hemosiderin here is stable. Stellate enhancing left occipital lesion remains significantly enlarged compared to August, about 15 mm diameter now,  versus 10-11 mm in December. Regional FLAIR hyperintensity continues to enlarge. See series 7, image 28. Only mild regional mass effect on the left occipital horn. No new areas of abnormal enhancement identified. No superimposed restricted diffusion to suggest acute infarction. No midline shift, ventriculomegaly, scattered moderate for age nonspecific cerebral white matter T2 and FLAIR hyperintensity elsewhere is stable. Or acute intracranial hemorrhage. Cervicomedullary junction and pituitary are within normal limits. Vascular: Major intracranial vascular flow voids are stable. The major dural venous sinuses are enhancing and appear to be patent. Skull and upper cervical spine: Negative visible spinal cord. Stable visible bone marrow signal, vertex craniotomy on the left. Sinuses/Orbits: Stable.  Mild sphenoid sinus mucosal thickening. Other: Visible internal auditory structures appear normal. Stable scalp soft tissues. IMPRESSION: 1. Further progressed enhancement and vasogenic edema at the left occipital metastasis site.  Treatment effect/pseudoprogression favored over true progression of disease at this time. 2. Regression of left superior frontal gyrus resection cavity enhancement and edema, with post treatment appearance there now resembling that in August 2022. 3. No new metastatic disease identified. Electronically Signed   By: Genevie Ann M.D.   On: 07/19/2021 08:02   CT Abdomen Pelvis W Contrast  Result Date: 08/08/2021 CLINICAL DATA:  Metastatic non-small cell lung cancer restaging, status post right upper lobectomy EXAM: CT CHEST, ABDOMEN, AND PELVIS WITH CONTRAST TECHNIQUE: Multidetector CT imaging of the chest, abdomen and pelvis was performed following the standard protocol during bolus administration of intravenous contrast. RADIATION DOSE REDUCTION: This exam was performed according to the departmental dose-optimization program which includes automated exposure control, adjustment of the mA and/or kV according to patient size and/or use of iterative reconstruction technique. CONTRAST:  134mL ISOVUE-300 IOPAMIDOL (ISOVUE-300) INJECTION 61%, additional oral enteric contrast COMPARISON:  06/10/2021 FINDINGS: CT CHEST FINDINGS Cardiovascular: Aortic atherosclerosis. Normal heart size. Left coronary artery calcifications. No pericardial effusion. Mediastinum/Nodes: No enlarged mediastinal, hilar, or axillary lymph nodes. Thyroid gland, trachea, and esophagus demonstrate no significant findings. Lungs/Pleura: Status post right upper lobectomy. Unchanged scarring adjacent to a suture line of the right middle lobe (series 3, image 47). Unchanged, trace, loculated right pleural effusion. Occasional tiny pulmonary nodules are unchanged, for example a 0.2 cm nodule of the lingula (series 3, image 75) and a 0.3 cm nodule of the superior segment left lower lobe (series 3, image 69). Musculoskeletal: No chest wall mass or suspicious osseous lesions identified. CT ABDOMEN PELVIS FINDINGS Hepatobiliary: No solid liver abnormality is  seen. No gallstones, gallbladder wall thickening, or biliary dilatation. Pancreas: Unremarkable. No pancreatic ductal dilatation or surrounding inflammatory changes. Spleen: Normal in size without significant abnormality. Adrenals/Urinary Tract: Adrenal glands are unremarkable. Kidneys are normal, without renal calculi, solid lesion, or hydronephrosis. Bladder is unremarkable. Stomach/Bowel: Stomach is within normal limits. Appendix appears normal. No evidence of bowel wall thickening, distention, or inflammatory changes. Moderate burden of stool throughout the colon. Vascular/Lymphatic: Aortic atherosclerosis. No enlarged abdominal or pelvic lymph nodes. Reproductive: Status post hysterectomy. Other: No abdominal wall hernia or abnormality. No ascites. Musculoskeletal: No acute osseous findings. IMPRESSION: 1. Status post right upper lobectomy. Unchanged scarring adjacent to a suture line of the right middle lobe. Unchanged, trace, loculated right pleural effusion. No evidence of malignant recurrence. 2. Occasional tiny pulmonary nodules are unchanged, measuring 0.3 cm and smaller. Attention on follow-up. 3. No evidence of lymphadenopathy or metastatic disease in the abdomen or pelvis. 4. Coronary artery disease. Aortic Atherosclerosis (ICD10-I70.0). Electronically Signed   By: Delanna Ahmadi M.D.   On: 08/08/2021 15:01  ASSESSMENT AND PLAN: This is a very pleasant 71 years old African-American female diagnosed with a stage IV (T3, N0, M1b) non-small cell lung cancer, adenocarcinoma presented with right upper lobe lung mass with solitary brain metastasis diagnosed in February 2022 status post SRS to the brain lesion followed by craniotomy and resection. The patient also has a solitary lung mass. S/p robotic assisted right upper lobectomy with en bloc wedge resection of the right middle lobe and lymph node dissection under the care of Dr. Roxan Hockey on September 24, 2020. She also underwent SRS treatment to a  new subcentimeter brain lesions under the care of Dr. Lisbeth Renshaw She is currently undergoing systemic chemotherapy with carboplatin for AUC of 5, Alimta 500 Mg/M2 and Keytruda 200 Mg IV every 3 weeks status post 12 cycles.  Starting from cycle #5 the patient will be on maintenance treatment with Alimta and Keytruda every 3 weeks. The patient continues to tolerate her treatment with maintenance Alimta and Keytruda fairly well. She had repeat CT scan of the chest, abdomen pelvis performed recently.  I personally and independently reviewed the scans and discussed the results with the patient and her family. Her scan showed no concerning findings for disease progression. I recommended for her to continue her current maintenance treatment with Alimta and Keytruda and she will proceed with cycle #13 today. For the brain metastasis and vasogenic edema, she is followed closely by Dr. Mickeal Skinner and her last MRI of the brain showed no worsening findings. The patient was advised to call immediately if she has any other concerning symptoms in the interval.  The patient voices understanding of current disease status and treatment options and is in agreement with the current care plan.  All questions were answered. The patient knows to call the clinic with any problems, questions or concerns. We can certainly see the patient much sooner if necessary.  Disclaimer: This note was dictated with voice recognition software. Similar sounding words can inadvertently be transcribed and may not be corrected upon review.

## 2021-08-14 NOTE — Patient Instructions (Signed)
[facility]  Discharge Instructions: Thank you for choosing [facility] to provide your oncology and hematology care.   If you have a lab appointment with the [facility], please go directly to the [facility] and check in at the registration area.   Wear comfortable clothing and clothing appropriate for easy access to any Portacath or PICC line.   We strive to give you quality time with your provider. You may need to reschedule your appointment if you arrive late (15 or more minutes).  Arriving late affects you and other patients whose appointments are after yours.  Also, if you miss three or more appointments without notifying the office, you may be dismissed from the clinic at the providers discretion.      For prescription refill requests, have your pharmacy contact our office and allow 72 hours for refills to be completed.    Today you received the following chemotherapy and/or immunotherapy agents: Keytruda and Alimta      To help prevent nausea and vomiting after your treatment, we encourage you to take your nausea medication as directed.  BELOW ARE SYMPTOMS THAT SHOULD BE REPORTED IMMEDIATELY: *FEVER GREATER THAN 100.4 F (38 C) OR HIGHER *CHILLS OR SWEATING *NAUSEA AND VOMITING THAT IS NOT CONTROLLED WITH YOUR NAUSEA MEDICATION *UNUSUAL SHORTNESS OF BREATH *UNUSUAL BRUISING OR BLEEDING *URINARY PROBLEMS (pain or burning when urinating, or frequent urination) *BOWEL PROBLEMS (unusual diarrhea, constipation, pain near the anus) TENDERNESS IN MOUTH AND THROAT WITH OR WITHOUT PRESENCE OF ULCERS (sore throat, sores in mouth, or a toothache) UNUSUAL RASH, SWELLING OR PAIN  UNUSUAL VAGINAL DISCHARGE OR ITCHING   Items with * indicate a potential emergency and should be followed up as soon as possible or go to the Emergency Department if any problems should occur.  Please show the CHEMOTHERAPY ALERT CARD or IMMUNOTHERAPY ALERT CARD at  check-in to the Emergency Department and triage nurse.  Should you have questions after your visit or need to cancel or reschedule your appointment, please contact [facility]  Dept: [phone]  and follow the prompts.  Office hours are 8:00 a.m. to 4:30 p.m. Monday - Friday. Please note that voicemails left after 4:00 p.m. may not be returned until the following business day.  We are closed weekends and major holidays. You have access to a nurse at all times for urgent questions. Please call the main number to the clinic Dept: [phone] and follow the prompts.   For any non-urgent questions, you may also contact your provider using MyChart. We now offer e-Visits for anyone 30 and older to request care online for non-urgent symptoms. For details visit [email or web address].   Also download the MyChart app! Go to the app store, search "MyChart", open the app, select [facility], and log in with your MyChart username and password.  Due to Covid, a mask is required upon entering the hospital/clinic. If you do not have a mask, one will be given to you upon arrival. For doctor visits, patients may have 1 support person aged 13 or older with them. For treatment visits, patients cannot have anyone with them due to current Covid guidelines and our immunocompromised population.

## 2021-08-14 NOTE — Telephone Encounter (Deleted)
Patient

## 2021-08-14 NOTE — Telephone Encounter (Signed)
Called patient's daughter to advise that Per Dr. Mickeal Skinner: Madaline Brilliant to resume decadron at 1mg  daily. Patient has supply of 1 mg Decadron and does not need refill at this point.

## 2021-08-14 NOTE — Telephone Encounter (Signed)
Patient' daughter called to report that patient has been having headache for last week accompanied by some memory issues. Described as having a hard time "getting things out".Daughter is dropping  a urine sample to PCP this morning to rule out UTI. Daughter is sending my chart message to Dr. Earlie Server.

## 2021-08-15 LAB — URINE CULTURE
MICRO NUMBER:: 13012400
SPECIMEN QUALITY:: ADEQUATE

## 2021-08-16 ENCOUNTER — Telehealth: Payer: Self-pay

## 2021-08-16 NOTE — Telephone Encounter (Signed)
T/C from pt's daughter, [name], stating pt has been having headaches  for over a week. She is now having sharp pains along with the headaches. She has been on steroids since Wednesday and is taking tylenol but that is not controlling the pain.  Pt's weight has increased to 133# and she is wondering if the keppra needs to be increased along with the weight gain.  Please advise

## 2021-08-19 ENCOUNTER — Encounter: Payer: Self-pay | Admitting: Internal Medicine

## 2021-08-21 ENCOUNTER — Encounter: Payer: Self-pay | Admitting: Internal Medicine

## 2021-08-27 DIAGNOSIS — C3411 Malignant neoplasm of upper lobe, right bronchus or lung: Secondary | ICD-10-CM

## 2021-08-27 DIAGNOSIS — I10 Essential (primary) hypertension: Secondary | ICD-10-CM

## 2021-08-29 ENCOUNTER — Telehealth: Payer: Self-pay | Admitting: Cardiology

## 2021-08-29 NOTE — Telephone Encounter (Signed)
Spoke with daughter who reports patient had pain under left breast and had sob. I then spoke with pt who stated she had pain under left breast that went away when she repositioned. There is no pain there now. She also denies chest pain/pressure/tightness. Denies back pain or nausea. She is eating breakfast. I didn't notice any sob while talking to her. Recommended to patient and daughter that if patient has any chest pain/pressure/tightness, she is to go to the ED. They voiced understanding. Daughter wants Dr. Percival Spanish to know that pt has RUL lung cancer with brain mets.  ?

## 2021-08-29 NOTE — Telephone Encounter (Signed)
? ?  Pt c/o of Chest Pain: STAT if CP now or developed within 24 hours ? ?1. Are you having CP right now? No  ? ?2. Are you experiencing any other symptoms (ex. SOB, nausea, vomiting, sweating)? Weak  ? ?3. How long have you been experiencing CP? This morning at 8 am ? ?4. Is your CP continuous or coming and going? coming and going ? ?5. Have you taken Nitroglycerin? No  ? ?Pt's daughter calling, she said, her mom called this morning stating she is feeling pain under her breast on the left side "waive and zap" feeling. Denied SOB but she can hear on her mom's voice that pt is weak and winded. They wanted to know what they need to do ??  ?

## 2021-08-30 ENCOUNTER — Other Ambulatory Visit: Payer: Self-pay

## 2021-08-30 ENCOUNTER — Ambulatory Visit (INDEPENDENT_AMBULATORY_CARE_PROVIDER_SITE_OTHER): Payer: Medicare HMO | Admitting: Internal Medicine

## 2021-08-30 ENCOUNTER — Ambulatory Visit (INDEPENDENT_AMBULATORY_CARE_PROVIDER_SITE_OTHER): Payer: Medicare HMO

## 2021-08-30 VITALS — BP 120/60 | HR 74 | Temp 98.3°F | Ht 60.0 in | Wt 133.0 lb

## 2021-08-30 DIAGNOSIS — R079 Chest pain, unspecified: Secondary | ICD-10-CM

## 2021-08-30 DIAGNOSIS — R350 Frequency of micturition: Secondary | ICD-10-CM | POA: Diagnosis not present

## 2021-08-30 DIAGNOSIS — J90 Pleural effusion, not elsewhere classified: Secondary | ICD-10-CM | POA: Diagnosis not present

## 2021-08-30 DIAGNOSIS — K219 Gastro-esophageal reflux disease without esophagitis: Secondary | ICD-10-CM | POA: Diagnosis not present

## 2021-08-30 NOTE — Patient Instructions (Signed)
Please continue all other medications as before, and refills have been done if requested. ? ?Please have the pharmacy call with any other refills you may need. ? ?Please keep your appointments with your specialists as you may have planned ? ?Please go to the XRAY Department in the first floor for the x-ray testing ? ?Please go to the LAB at the blood drawing area for the tests to be done ? ?You will be contacted by phone if any changes need to be made immediately.  Otherwise, you will receive a letter about your results with an explanation, but please check with MyChart first. ? ?Please remember to sign up for MyChart if you have not done so, as this will be important to you in the future with finding out test results, communicating by private email, and scheduling acute appointments online when needed. ? ? ?

## 2021-08-30 NOTE — Progress Notes (Addendum)
[facility] OFFICE PROGRESS NOTE  [name], MD [place] [id number]  DIAGNOSIS: Stage IV non-small cell lung cancer (T3, N0, M1C) adenocarcinoma.  The patient presented with a right upper lobe lung mass and solitary brain metastasis.  The patient was diagnosed in February 2022.   Biomarker Findings Microsatellite status - MS-Stable Tumor Mutational Burden - 8 Muts/Mb Genomic Findings For a complete list of the genes assayed, please refer to the Appendix. KRAS G12V KEAP1 S224F TP53 P151T 7 Disease relevant genes with no reportable alterations: ALK, BRAF, EGFR, ERBB2, MET, RET, ROS1   PDL1 Expression: 90%  PRIOR THERAPY: 1) SRS to the metastatic brain lesion on 08/23/2020 under the care of Dr. Tammi Klippel and craniotomy under the care of Dr. Zada Finders on 08/24/2020. 2) S/p robotic assisted right upper lobectomy with en bloc wedge resection of the right middle lobe and lymph node dissection under the care of Dr. Roxan Hockey on September 24, 2020 3) SRS to the two new subcentimeter metastases under the care of Dr. Tammi Klippel on 11/29/20.   CURRENT THERAPY: Systemic chemotherapy with carboplatin for AUC of 5, Alimta 500 Mg/M2 and Keytruda 200 mg IV every 3 weeks.  First dose Nov 08, 2020. Status post 13 cycles.  Starting from cycle #5, the patient started maintenance Alimta and Keytruda.   INTERVAL HISTORY: Hannah [name] 71 y.o. female returns to the clinic today for a follow-up visit accompanied by her daughter.  The patient is feeling fairly well today without any concerning complaints.  Last week, the patient saw her PCP for the chief complaint of anterior chest discomfort rash, and swelling approximately 1 week prior to the appointment which her symptoms lasted 2 days.  The patient had a chest x-ray performed on Friday which was negative for any abnormalities. She had recently started using new soap. Since stopping using the soap, her symptoms have resolved. She  also reports she is up to date on her mammogram. The patient also previously increased headaches for which she was restarted on Decadron.  She is scheduled for repeat brain MRI on 09/20/2021 and follow-up with Dr. Mickeal Skinner from neuro oncology few days later.   She also had an episode approximately 1 week ago where she had some sharp/soreness across her central to anterior left chest/left breast.  It occurred at rest.  She talked to her cardiologist regarding this.  Her symptoms have resolved and she has not had any recurrent symptoms.  She denies any traumas or injuries.  She notes that she has been active with cleaning her closet.  She denies any current overlying skin changes, breast masses, or skin dimpling.  Otherwise she is tolerating her treatment fairly well.  She denies any fever, chills, night sweats, or unexplained weight loss.  She denies any shortness of breath or cough.  Denies any chest discomfort today.  Denies any hemoptysis.  Denies any nausea, vomiting, diarrhea, or constipation.  Denies any visual changes.  Denies any rashes or skin changes.  She is here today for evaluation and repeat blood work before starting cycle #14.  MEDICAL HISTORY: Past Medical History:  Diagnosis Date   Allergy    Anemia    Anxiety    Arthritis    Carpal tunnel syndrome    Constipation    senna C stool softeners help    Depression    Diverticulosis    Dyslipidemia    External hemorrhoids    GERD (gastroesophageal reflux disease)  Heart murmur    mild-moderate AR   Hiatal hernia    Hyperlipidemia    on meds    Hypertension    Internal hemorrhoids    lung ca with brain mets 07/2020   Osteoarthritis    Pre-diabetes    PVD (peripheral vascular disease) (HCC)    moderate carotid disease   RBBB    Smoker    Vocal cord polyps     ALLERGIES:  is allergic to amlodipine, chantix [varenicline tartrate], clarithromycin, lisinopril, simvastatin, wellbutrin [bupropion hcl], lipitor [atorvastatin],  and sertraline.  MEDICATIONS:  Current Outpatient Medications  Medication Sig Dispense Refill   acetaminophen (TYLENOL) 500 MG tablet Take 2 tablets (1,000 mg total) by mouth every 6 (six) hours. 30 tablet 0   ALPRAZolam (XANAX XR) 0.5 MG 24 hr tablet Take one tablet (0.5 mg total) by mouth every night at bedtime. 30 tablet 5   ALPRAZolam (XANAX) 0.5 MG tablet Take one tablet (0.5 mg total) by mouth qam and may take one more tablet (0.5 mg total) qd prn for anxiety. 120 tablet 5   Ascorbic Acid (VITAMIN C) 1000 MG tablet Take 1,000 mg by mouth daily.     aspirin 81 MG tablet Take 1 tablet (81 mg total) by mouth daily. Restart on 08/31/20 30 tablet    bisacodyl (DULCOLAX) 5 MG EC tablet Take 5 mg by mouth daily as needed for moderate constipation.     carboxymethylcellulose (REFRESH PLUS) 0.5 % SOLN Place 1-2 drops into both eyes 3 (three) times daily as needed (dry eyes).     Cholecalciferol (D3 PO) Take 1 capsule by mouth daily.     citalopram (CELEXA) 10 MG tablet Take 1 tablet (10 mg total) by mouth daily. 30 tablet 1   dexamethasone (DECADRON) 1 MG tablet Take 1 tablet (1 mg total) by mouth daily. 60 tablet 1   folic acid (FOLVITE) 1 MG tablet Take 1 tablet (1 mg total) by mouth daily. 30 tablet 2   Lacosamide 100 MG TABS Take 1 tablet (100 mg total) by mouth 2 (two) times daily. 60 tablet 3   levETIRAcetam (KEPPRA) 100 MG/ML solution Take 15 mLs (1,500 mg total) by mouth 2 (two) times daily. 473 mL 6   loratadine (CLARITIN) 10 MG tablet Take 10 mg by mouth daily as needed for allergies.     Menthol, Topical Analgesic, (BIOFREEZE EX) Apply 1 application topically as needed (pain).     mirtazapine (REMERON) 45 MG tablet Take 1 tablet (45 mg total) by mouth at bedtime. 30 tablet 3   olopatadine (PATANOL) 0.1 % ophthalmic solution Place 1 drop into both eyes 2 (two) times daily as needed for allergies.     omeprazole (PRILOSEC) 40 MG capsule TAKE 1 CAPSULE (40 MG TOTAL) BY MOUTH 2 (TWO) TIMES  DAILY BEFORE A MEAL. (Patient taking differently: Take 40 mg by mouth in the morning and at bedtime.) 180 capsule 0   polyethylene glycol (MIRALAX / GLYCOLAX) 17 g packet Take 17 g by mouth 2 (two) times daily. 72 each 0   pravastatin (PRAVACHOL) 40 MG tablet TAKE 1 TABLET (40 MG TOTAL) BY MOUTH EVERY EVENING. 90 tablet 0   Probiotic Product (PROBIOTIC DAILY PO) Take 1 capsule by mouth daily.     prochlorperazine (COMPAZINE) 10 MG tablet Take 1 tablet (10 mg total) by mouth every 6 (six) hours as needed for nausea or vomiting. 30 tablet 0   simethicone (MYLICON) 80 MG chewable tablet Chew 1 tablet (80 mg  total) by mouth 4 (four) times daily as needed for flatulence (Bloating). 30 tablet 0   No current facility-administered medications for this visit.   Facility-Administered Medications Ordered in Other Visits  Medication Dose Route Frequency Provider Last Rate Last Admin   0.9 %  sodium chloride infusion   Intravenous Once Curt Bears, MD       cyanocobalamin ((VITAMIN B-12)) injection 1,000 mcg  1,000 mcg Intramuscular Once Curt Bears, MD       pembrolizumab [facility]) 200 mg in sodium chloride 0.9 % 50 mL chemo infusion  200 mg Intravenous Once Curt Bears, MD       PEMEtrexed (ALIMTA) 700 mg in sodium chloride 0.9 % 100 mL chemo infusion  500 mg/m2 (Treatment Plan Recorded) Intravenous Once Curt Bears, MD       prochlorperazine (COMPAZINE) tablet 10 mg  10 mg Oral Once Curt Bears, MD        SURGICAL HISTORY:  Past Surgical History:  Procedure Laterality Date   ABDOMINAL HYSTERECTOMY  6256   APPLICATION OF CRANIAL NAVIGATION N/A 08/24/2020   Procedure: APPLICATION OF CRANIAL NAVIGATION;  Surgeon: Judith Part, MD;  Location: [facility];  Service: Neurosurgery;  Laterality: N/A;   COLONOSCOPY     COLONOSCOPY W/ POLYPECTOMY  2009   CRANIOTOMY Left 08/24/2020   Procedure: Left Craniotomy for Tumor Resection with Brainlab;  Surgeon: Judith Part, MD;   Location: [facility];  Service: Neurosurgery;  Laterality: Left;   HEMORRHOID SURGERY     INTERCOSTAL NERVE BLOCK Right 09/24/2020   Procedure: INTERCOSTAL NERVE BLOCK;  Surgeon: Melrose Nakayama, MD;  Location: [facility];  Service: Thoracic;  Laterality: Right;   JOINT REPLACEMENT     LYMPH NODE DISSECTION Right 09/24/2020   Procedure: LYMPH NODE DISSECTION;  Surgeon: Melrose Nakayama, MD;  Location: [facility];  Service: Thoracic;  Laterality: Right;   POLYPECTOMY     right total knee arthroplasty     Dr. Emeterio Reeve 06-04-18   [place]  08/16/2009   TOTAL KNEE ARTHROPLASTY Left 02/13/2014   Procedure: TOTAL KNEE ARTHROPLASTY;  Surgeon: Alta Corning, MD;  Location: [facility];  Service: Orthopedics;  Laterality: Left;   TOTAL KNEE ARTHROPLASTY Right 06/04/2018   Procedure: RIGHT TOTAL KNEE ARTHROPLASTY;  Surgeon: Dorna Leitz, MD;  Location: [facility];  Service: Orthopedics;  Laterality: Right;  Adductor Block   TUBAL LIGATION      REVIEW OF SYSTEMS:   Review of Systems  Constitutional: Negative for appetite change, chills, fatigue, fever and unexpected weight change.  HENT:   Negative for mouth sores, nosebleeds, sore throat and trouble swallowing.   Eyes: Negative for eye problems and icterus.  Respiratory: Negative for cough, hemoptysis, shortness of breath and wheezing.   Cardiovascular: Negative for chest pain and leg swelling.  Gastrointestinal: Negative for abdominal pain, constipation, diarrhea, nausea and vomiting.  Genitourinary: Negative for bladder incontinence, difficulty urinating, dysuria, frequency and hematuria.   Musculoskeletal: Negative for back pain, gait problem, neck pain and neck stiffness.  Skin: Negative for itching and rash.  Neurological: Negative for dizziness, extremity weakness, gait problem, headaches, light-headedness and seizures.  Hematological: Negative for adenopathy. Does not bruise/bleed easily.  Psychiatric/Behavioral: Negative for confusion, depression and  sleep disturbance. The patient is not nervous/anxious.     PHYSICAL EXAMINATION:  Blood pressure (!) 142/69, pulse 71, temperature 97.7 F (36.5 C), temperature source Tympanic, resp. rate 18, height 5' (1.524 m), weight 134 lb 12.8 oz (61.1 kg), SpO2 99 %.  ECOG PERFORMANCE STATUS: 1  Physical Exam  Constitutional: Oriented to person, place, and time and thin appearing female and in no distress. HENT: Head: Normocephalic and atraumatic. Mouth/Throat: Oropharynx is clear and moist. No oropharyngeal exudate. No thrush.  Eyes: Conjunctivae are normal. Right eye exhibits no discharge. Left eye exhibits no discharge. No scleral icterus. Neck: Normal range of motion. Neck supple. Cardiovascular: Normal rate, regular rhythm, systolic murmur noted and intact distal pulses.   Pulmonary/Chest: Effort normal. Decreased breath sounds in right lung base. No respiratory distress. No wheezes. No rales. Abdominal: Soft. Bowel sounds are normal. Exhibits no distension and no mass. There is no tenderness.  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 No weakness in hands. No decreased sensation. No swelling or erythema. Normal range of motion. Exhibits no edema.  Lymphadenopathy:    No cervical adenopathy.  Neurological: Alert and oriented to person, place, and time. Exhibits normal muscle tone. Gait normal. Coordination normal. Skin: Skin is warm and dry. No rash noted. Not diaphoretic. No erythema. No pallor.  Psychiatric: Mood, memory and judgment normal. Vitals reviewed  LABORATORY DATA: Lab Results  Component Value Date   WBC 6.5 09/04/2021   HGB 11.0 (L) 09/04/2021   HCT 34.2 (L) 09/04/2021   MCV 89.8 09/04/2021   PLT 251 09/04/2021      Chemistry      Component Value Date/Time   NA 139 09/04/2021 1129   K 3.8 09/04/2021 1129   CL 102 09/04/2021 1129   CO2 31 09/04/2021 1129   BUN 15 09/04/2021 1129   CREATININE 0.72 09/04/2021 1129   CREATININE 0.79 03/14/2020 1016       Component Value Date/Time   CALCIUM 10.2 09/04/2021 1129   ALKPHOS 66 09/04/2021 1129   AST 20 09/04/2021 1129   ALT 18 09/04/2021 1129   BILITOT 0.2 (L) 09/04/2021 1129       RADIOGRAPHIC STUDIES:  DG Chest 2 View  Result Date: 08/31/2021 CLINICAL DATA:  Chest pain. EXAM: CHEST - 2 VIEW COMPARISON:  Chest x-ray 07/29/2021.  CT chest 08/08/2021. FINDINGS: There is some scarring in the right lung base, unchanged. The lungs are otherwise clear. Small right pleural effusion is unchanged. Cardiomediastinal silhouette is within normal limits. No acute fractures are seen. IMPRESSION: 1. Stable small right pleural effusion with right basilar scarring. Electronically Signed   By: Ronney Asters M.D.   On: 08/31/2021 22:24   CT Chest W Contrast  Result Date: 08/08/2021 CLINICAL DATA:  Metastatic non-small cell lung cancer restaging, status post right upper lobectomy EXAM: CT CHEST, ABDOMEN, AND PELVIS WITH CONTRAST TECHNIQUE: Multidetector CT imaging of the chest, abdomen and pelvis was performed following the standard protocol during bolus administration of intravenous contrast. RADIATION DOSE REDUCTION: This exam was performed according to the departmental dose-optimization program which includes automated exposure control, adjustment of the mA and/or kV according to patient size and/or use of iterative reconstruction technique. CONTRAST:  165mL ISOVUE-300 IOPAMIDOL (ISOVUE-300) INJECTION 61%, additional oral enteric contrast COMPARISON:  06/10/2021 FINDINGS: CT CHEST FINDINGS Cardiovascular: Aortic atherosclerosis. Normal heart size. Left coronary artery calcifications. No pericardial effusion. Mediastinum/Nodes: No enlarged mediastinal, hilar, or axillary lymph nodes. Thyroid gland, trachea, and esophagus demonstrate no significant findings. Lungs/Pleura: Status post right upper lobectomy. Unchanged scarring adjacent to a suture line of the right middle lobe (series 3, image 47). Unchanged, trace,  loculated right pleural effusion. Occasional tiny pulmonary nodules are unchanged, for example a 0.2 cm nodule of the lingula (series 3, image 75) and  a 0.3 cm nodule of the superior segment left lower lobe (series 3, image 69). Musculoskeletal: No chest wall mass or suspicious osseous lesions identified. CT ABDOMEN PELVIS FINDINGS Hepatobiliary: No solid liver abnormality is seen. No gallstones, gallbladder wall thickening, or biliary dilatation. Pancreas: Unremarkable. No pancreatic ductal dilatation or surrounding inflammatory changes. Spleen: Normal in size without significant abnormality. Adrenals/Urinary Tract: Adrenal glands are unremarkable. Kidneys are normal, without renal calculi, solid lesion, or hydronephrosis. Bladder is unremarkable. Stomach/Bowel: Stomach is within normal limits. Appendix appears normal. No evidence of bowel wall thickening, distention, or inflammatory changes. Moderate burden of stool throughout the colon. Vascular/Lymphatic: Aortic atherosclerosis. No enlarged abdominal or pelvic lymph nodes. Reproductive: Status post hysterectomy. Other: No abdominal wall hernia or abnormality. No ascites. Musculoskeletal: No acute osseous findings. IMPRESSION: 1. Status post right upper lobectomy. Unchanged scarring adjacent to a suture line of the right middle lobe. Unchanged, trace, loculated right pleural effusion. No evidence of malignant recurrence. 2. Occasional tiny pulmonary nodules are unchanged, measuring 0.3 cm and smaller. Attention on follow-up. 3. No evidence of lymphadenopathy or metastatic disease in the abdomen or pelvis. 4. Coronary artery disease. Aortic Atherosclerosis (ICD10-I70.0). Electronically Signed   By: Delanna Ahmadi M.D.   On: 08/08/2021 15:01   CT Abdomen Pelvis W Contrast  Result Date: 08/08/2021 CLINICAL DATA:  Metastatic non-small cell lung cancer restaging, status post right upper lobectomy EXAM: CT CHEST, ABDOMEN, AND PELVIS WITH CONTRAST TECHNIQUE:  Multidetector CT imaging of the chest, abdomen and pelvis was performed following the standard protocol during bolus administration of intravenous contrast. RADIATION DOSE REDUCTION: This exam was performed according to the departmental dose-optimization program which includes automated exposure control, adjustment of the mA and/or kV according to patient size and/or use of iterative reconstruction technique. CONTRAST:  192mL ISOVUE-300 IOPAMIDOL (ISOVUE-300) INJECTION 61%, additional oral enteric contrast COMPARISON:  06/10/2021 FINDINGS: CT CHEST FINDINGS Cardiovascular: Aortic atherosclerosis. Normal heart size. Left coronary artery calcifications. No pericardial effusion. Mediastinum/Nodes: No enlarged mediastinal, hilar, or axillary lymph nodes. Thyroid gland, trachea, and esophagus demonstrate no significant findings. Lungs/Pleura: Status post right upper lobectomy. Unchanged scarring adjacent to a suture line of the right middle lobe (series 3, image 47). Unchanged, trace, loculated right pleural effusion. Occasional tiny pulmonary nodules are unchanged, for example a 0.2 cm nodule of the lingula (series 3, image 75) and a 0.3 cm nodule of the superior segment left lower lobe (series 3, image 69). Musculoskeletal: No chest wall mass or suspicious osseous lesions identified. CT ABDOMEN PELVIS FINDINGS Hepatobiliary: No solid liver abnormality is seen. No gallstones, gallbladder wall thickening, or biliary dilatation. Pancreas: Unremarkable. No pancreatic ductal dilatation or surrounding inflammatory changes. Spleen: Normal in size without significant abnormality. Adrenals/Urinary Tract: Adrenal glands are unremarkable. Kidneys are normal, without renal calculi, solid lesion, or hydronephrosis. Bladder is unremarkable. Stomach/Bowel: Stomach is within normal limits. Appendix appears normal. No evidence of bowel wall thickening, distention, or inflammatory changes. Moderate burden of stool throughout the colon.  Vascular/Lymphatic: Aortic atherosclerosis. No enlarged abdominal or pelvic lymph nodes. Reproductive: Status post hysterectomy. Other: No abdominal wall hernia or abnormality. No ascites. Musculoskeletal: No acute osseous findings. IMPRESSION: 1. Status post right upper lobectomy. Unchanged scarring adjacent to a suture line of the right middle lobe. Unchanged, trace, loculated right pleural effusion. No evidence of malignant recurrence. 2. Occasional tiny pulmonary nodules are unchanged, measuring 0.3 cm and smaller. Attention on follow-up. 3. No evidence of lymphadenopathy or metastatic disease in the abdomen or pelvis. 4. Coronary artery disease. Aortic Atherosclerosis (  ICD10-I70.0). Electronically Signed   By: Delanna Ahmadi M.D.   On: 08/08/2021 15:01     ASSESSMENT/PLAN:  This is a very pleasant 71 year old African-American female diagnosed with stage IV (T3, N0, M1 B) non-small cell lung cancer, adenocarcinoma.  She presented with a right upper lobe lung mass with a solitary brain metastasis.  She was diagnosed in February 2022.  Her PD-L1 expression is 90%.  She does not have any actionable mutations.   The patient completed SRS followed by craniotomy and resection on 08/23/2020 under the care of Dr. Tammi Klippel and craniotomy under the care of Dr. Zada Finders on 08/24/2020.   She then had a robot-assisted right upper lobectomy with en bloc wedge resection of the right middle lobe and lymph node dissection under the care of Dr. Roxan Hockey on September 24, 2020.   She underwent SRS to the two new subcentimeter brain metastases on 11/29/20.  The patient is currently undergoing systemic chemotherapy with carboplatin for an AUC of 5, Alimta 500 mg per metered squared, Keytruda 200 mg IV every 3 weeks.  She is status post 13 cycles and tolerated it well without any adverse side effects.  Starting from cycle #5, the patient started maintenance Alimta and Keytruda.    Labs were reviewed.  Recommend that she  proceed cycle #14 today scheduled.  We will see her back for follow-up visit in 3 weeks for evaluation before undergoing cycle #15.  The patient is scheduled to have a repeat brain MRI and a follow-up visit with Dr. Mickeal Skinner on 09/20/2021. She is currently on Keppra and vimpat  Regarding her episode of anterior chest discomfort, it sounds like it may have been musculoskeletal in nature since most of her discomfort was in the anterior chest muscles and left breast.  Her symptoms were not provoked by exertion.  I reviewed concerning symptoms with the patient such as chest discomfort with associated lightheadedness, shortness of breath, nausea, vomiting, diaphoresis, and/or leg swelling with the patient.  These are symptoms that would warrant emergency room evaluation should these symptoms occur. She is not having chest discomfort today. Her vitals are WNL. No tachycardia, hypotension, or hypoxia. Denies shortness of breath or breathing concerns today.  The patient's daughter was asking how long she will be on her current treatment.  Discussed with her that we generally keep the patient on Alimta as maintenance as long as the treatment is working and she does not show any evidence of disease progression or unacceptable toxicity.  I discussed in lung cancer, usually Beryle Flock is given up to 2 years as long as there is no disease progression or unacceptable toxicity.  The patient was advised to call immediately if she has any concerning symptoms in the interval. The patient voices understanding of current disease status and treatment options and is in agreement with the current care plan. All questions were answered. The patient knows to call the clinic with any problems, questions or concerns. We can certainly see the patient much sooner if necessary      Orders Placed This Encounter  Procedures   TSH    Standing Status:   Standing    Number of Occurrences:   10    Standing Expiration Date:   09/05/2022      The total time spent in the appointment was 20-29 minutes.   Dontrell Stuck L Colbie Danner, PA-C 09/04/21

## 2021-08-30 NOTE — Progress Notes (Signed)
Patient ID: Hannah [name], female   DOB: [birth year], 71 y.o.   MRN: [id number]        Chief Complaint: follow up urinary frequency, and chest pain       HPI:  Hannah [name] is a 71 y.o. female here with c/o 2-3 days onset urinary freqency and mild lower abd discomfort, but Denies urinary symptoms such as dysuria, frequency, urgency, flank pain, hematuria or n/v, fever, chills.  Concern is for UTI.  Also c/o 2 wks fleeting sharp chest pains but Pt denies increased sob or doe, wheezing, orthopnea, PND, increased LE swelling, palpitations, dizziness or syncope. Nothing makes better or worse  No fever, cough. Denies worsening reflux, abd pain, dysphagia, n/v, bowel change or blood.         Wt Readings from Last 3 Encounters:  08/30/21 133 lb (60.3 kg)  08/14/21 133 lb 14.4 oz (60.7 kg)  07/29/21 130 lb (59 kg)   BP Readings from Last 3 Encounters:  08/30/21 120/60  08/14/21 (!) 141/74  07/29/21 116/76         Past Medical History:  Diagnosis Date   Allergy    Anemia    Anxiety    Arthritis    Carpal tunnel syndrome    Constipation    senna C stool softeners help    Depression    Diverticulosis    Dyslipidemia    External hemorrhoids    GERD (gastroesophageal reflux disease)    Heart murmur    mild-moderate AR   Hiatal hernia    Hyperlipidemia    on meds    Hypertension    Internal hemorrhoids    lung ca with brain mets 07/2020   Osteoarthritis    Pre-diabetes    PVD (peripheral vascular disease) (HCC)    moderate carotid disease   RBBB    Smoker    Vocal cord polyps    Past Surgical History:  Procedure Laterality Date   ABDOMINAL HYSTERECTOMY  7564   APPLICATION OF CRANIAL NAVIGATION N/A 08/24/2020   Procedure: APPLICATION OF CRANIAL NAVIGATION;  Surgeon: Judith Part, MD;  Location: [facility];  Service: Neurosurgery;  Laterality: N/A;   COLONOSCOPY     COLONOSCOPY W/ POLYPECTOMY  2009   CRANIOTOMY Left 08/24/2020   Procedure: Left Craniotomy for Tumor Resection  with Brain[facility];  Surgeon: Judith Part, MD;  Location: [facility];  Service: Neurosurgery;  Laterality: Left;   HEMORRHOID SURGERY     INTERCOSTAL NERVE BLOCK Right 09/24/2020   Procedure: INTERCOSTAL NERVE BLOCK;  Surgeon: Melrose Nakayama, MD;  Location: [facility];  Service: Thoracic;  Laterality: Right;   JOINT REPLACEMENT     LYMPH NODE DISSECTION Right 09/24/2020   Procedure: LYMPH NODE DISSECTION;  Surgeon: Melrose Nakayama, MD;  Location: [facility];  Service: Thoracic;  Laterality: Right;   POLYPECTOMY     right total knee arthroplasty     Dr. Emeterio Reeve 06-04-18   [place]  08/16/2009   TOTAL KNEE ARTHROPLASTY Left 02/13/2014   Procedure: TOTAL KNEE ARTHROPLASTY;  Surgeon: Alta Corning, MD;  Location: [facility];  Service: Orthopedics;  Laterality: Left;   TOTAL KNEE ARTHROPLASTY Right 06/04/2018   Procedure: RIGHT TOTAL KNEE ARTHROPLASTY;  Surgeon: Dorna Leitz, MD;  Location: [facility];  Service: Orthopedics;  Laterality: Right;  Adductor Block   TUBAL LIGATION      reports that she quit smoking about 13 months ago. Her smoking use included cigarettes. She has a 10.00 pack-year  smoking history. She has never been exposed to tobacco smoke. She has never used smokeless tobacco. She reports that she does not drink alcohol and does not use drugs. family history includes Breast cancer in her maternal aunt; Cancer in her father and sister; Colitis in her maternal aunt; Diabetes in her sister; Prostate cancer in her father; Stroke in her maternal grandfather. Allergies  Allergen Reactions   Amlodipine Swelling and Rash    Rash, swelling   Chantix [Varenicline Tartrate] Shortness Of Breath, Swelling and Other (See Comments)    Tongue swell,sob   Clarithromycin Rash   Lisinopril Hives   Simvastatin Hives   Wellbutrin [Bupropion Hcl] Hives   Lipitor [Atorvastatin]     Dizziness per patient   Sertraline     Makes her feel like she is going to kill someone   Current Outpatient Medications on  File Prior to Visit  Medication Sig Dispense Refill   acetaminophen (TYLENOL) 500 MG tablet Take 2 tablets (1,000 mg total) by mouth every 6 (six) hours. 30 tablet 0   ALPRAZolam (XANAX XR) 0.5 MG 24 hr tablet Take one tablet (0.5 mg total) by mouth every night at bedtime. 30 tablet 5   ALPRAZolam (XANAX) 0.5 MG tablet Take one tablet (0.5 mg total) by mouth qam and may take one more tablet (0.5 mg total) qd prn for anxiety. 120 tablet 5   Ascorbic Acid (VITAMIN C) 1000 MG tablet Take 1,000 mg by mouth daily.     aspirin 81 MG tablet Take 1 tablet (81 mg total) by mouth daily. Restart on 08/31/20 30 tablet    bisacodyl (DULCOLAX) 5 MG EC tablet Take 5 mg by mouth daily as needed for moderate constipation.     carboxymethylcellulose (REFRESH PLUS) 0.5 % SOLN Place 1-2 drops into both eyes 3 (three) times daily as needed (dry eyes).     cephALEXin (KEFLEX) 500 MG capsule Take 1 capsule (500 mg total) by mouth 2 (two) times daily. 14 capsule 0   Cholecalciferol (D3 PO) Take 1 capsule by mouth daily.     citalopram (CELEXA) 10 MG tablet Take 1 tablet (10 mg total) by mouth daily. 30 tablet 1   dexamethasone (DECADRON) 1 MG tablet Take 1 tablet (1 mg total) by mouth daily. 60 tablet 1   folic acid (FOLVITE) 1 MG tablet Take 1 tablet (1 mg total) by mouth daily. 30 tablet 2   Lacosamide 100 MG TABS Take 1 tablet (100 mg total) by mouth 2 (two) times daily. 60 tablet 3   levETIRAcetam (KEPPRA) 100 MG/ML solution Take 15 mLs (1,500 mg total) by mouth 2 (two) times daily. 473 mL 6   loratadine (CLARITIN) 10 MG tablet Take 10 mg by mouth daily as needed for allergies.     Menthol, Topical Analgesic, (BIOFREEZE EX) Apply 1 application topically as needed (pain).     mirtazapine (REMERON) 45 MG tablet Take 1 tablet (45 mg total) by mouth at bedtime. 30 tablet 3   olopatadine (PATANOL) 0.1 % ophthalmic solution Place 1 drop into both eyes 2 (two) times daily as needed for allergies.     omeprazole (PRILOSEC) 40  MG capsule TAKE 1 CAPSULE (40 MG TOTAL) BY MOUTH 2 (TWO) TIMES DAILY BEFORE A MEAL. (Patient taking differently: Take 40 mg by mouth in the morning and at bedtime.) 180 capsule 0   polyethylene glycol (MIRALAX / GLYCOLAX) 17 g packet Take 17 g by mouth 2 (two) times daily. 72 each 0   pravastatin (  PRAVACHOL) 40 MG tablet TAKE 1 TABLET (40 MG TOTAL) BY MOUTH EVERY EVENING. 90 tablet 0   Probiotic Product (PROBIOTIC DAILY PO) Take 1 capsule by mouth daily.     prochlorperazine (COMPAZINE) 10 MG tablet Take 1 tablet (10 mg total) by mouth every 6 (six) hours as needed for nausea or vomiting. 30 tablet 0   simethicone (MYLICON) 80 MG chewable tablet Chew 1 tablet (80 mg total) by mouth 4 (four) times daily as needed for flatulence (Bloating). 30 tablet 0   No current facility-administered medications on file prior to visit.        ROS:  All others reviewed and negative.  Objective        PE:  BP 120/60 (BP Location: Left Arm, Patient Position: Sitting, Cuff Size: Large)    Pulse 74    Temp 98.3 F (36.8 C) (Oral)    Ht 5' (1.524 m)    Wt 133 lb (60.3 kg)    SpO2 96%    BMI 25.97 kg/m                 Constitutional: Pt appears in NAD               HENT: Head: NCAT.                Right Ear: External ear normal.                 Left Ear: External ear normal.                Eyes: . Pupils are equal, round, and reactive to light. Conjunctivae and EOM are normal               Nose: without d/c or deformity               Neck: Neck supple. Gross normal ROM               Cardiovascular: Normal rate and regular rhythm.                 Pulmonary/Chest: Effort normal and breath sounds without rales or wheezing.                Abd:  Soft, NT, ND, + BS, no organomegaly               Neurological: Pt is alert. At baseline orientation, motor grossly intact               Skin: Skin is warm. No rashes, no other new lesions, LE edema - none               Psychiatric: Pt behavior is normal without agitation    Micro: none  Cardiac tracings I have personally interpreted today:  none  Pertinent Radiological findings (summarize): none   Lab Results  Component Value Date   WBC 4.2 08/14/2021   HGB 10.8 (L) 08/14/2021   HCT 32.9 (L) 08/14/2021   PLT 286 08/14/2021   GLUCOSE 122 (H) 08/14/2021   CHOL 171 03/20/2021   TRIG 286.0 (H) 03/20/2021   HDL 46.90 03/20/2021   LDLDIRECT 73.0 03/20/2021   LDLCALC 62 03/14/2020   ALT 33 08/14/2021   AST 34 08/14/2021   NA 141 08/14/2021   K 3.9 08/14/2021   CL 107 08/14/2021   CREATININE 0.71 08/14/2021   BUN 12 08/14/2021   CO2 28 08/14/2021   TSH 1.153 07/24/2021   INR 1.0 02/12/2021  HGBA1C 6.0 03/20/2021   MICROALBUR <0.7 03/20/2021   Assessment/Plan:  SHYLO ZAMOR is a 71 y.o. Black or African American [2] female with  has a past medical history of Allergy, Anemia, Anxiety, Arthritis, Carpal tunnel syndrome, Constipation, Depression, Diverticulosis, Dyslipidemia, External hemorrhoids, GERD (gastroesophageal reflux disease), Heart murmur, Hiatal hernia, Hyperlipidemia, Hypertension, Internal hemorrhoids, lung ca with brain mets (07/2020), Osteoarthritis, Pre-diabetes, PVD (peripheral vascular disease) ([place]), RBBB, Smoker, and Vocal cord polyps.  GERD (gastroesophageal reflux disease) Asympt, pt on prilosec 40 bid, to cotinue and  to f/u any worsening symptoms or concerns  Urinary frequency Mild to mod, for urine studies and consdier antibx course pending labs,  to f/u any worsening symptoms or concerns  Chest pain Atypical etiology unclear, VSS and non toxic, have very low suspicoin for cardiac, for CXR  Followup: No follow-ups on file.  Cathlean Cower, MD 09/01/2021 12:54 PM [facility]

## 2021-09-01 NOTE — Assessment & Plan Note (Signed)
Asympt, pt on prilosec 40 bid, to cotinue and  to f/u any worsening symptoms or concerns ?

## 2021-09-01 NOTE — Assessment & Plan Note (Signed)
Atypical etiology unclear, VSS and non toxic, have very low suspicoin for cardiac, for CXR ?

## 2021-09-01 NOTE — Assessment & Plan Note (Addendum)
Mild to mod, for urine studies and consdier antibx course pending labs,  to f/u any worsening symptoms or concerns ?

## 2021-09-02 ENCOUNTER — Encounter: Payer: Self-pay | Admitting: Internal Medicine

## 2021-09-03 ENCOUNTER — Ambulatory Visit: Payer: Medicare HMO | Admitting: Internal Medicine

## 2021-09-04 ENCOUNTER — Other Ambulatory Visit: Payer: Self-pay

## 2021-09-04 ENCOUNTER — Inpatient Hospital Stay: Payer: Medicare HMO | Attending: Internal Medicine

## 2021-09-04 ENCOUNTER — Inpatient Hospital Stay (HOSPITAL_BASED_OUTPATIENT_CLINIC_OR_DEPARTMENT_OTHER): Payer: Medicare HMO | Admitting: Physician Assistant

## 2021-09-04 ENCOUNTER — Encounter: Payer: Self-pay | Admitting: Physician Assistant

## 2021-09-04 ENCOUNTER — Other Ambulatory Visit: Payer: Self-pay | Admitting: Internal Medicine

## 2021-09-04 ENCOUNTER — Inpatient Hospital Stay: Payer: Medicare HMO

## 2021-09-04 VITALS — BP 142/69 | HR 71 | Temp 97.7°F | Resp 18 | Ht 60.0 in | Wt 134.8 lb

## 2021-09-04 DIAGNOSIS — R21 Rash and other nonspecific skin eruption: Secondary | ICD-10-CM | POA: Insufficient documentation

## 2021-09-04 DIAGNOSIS — J984 Other disorders of lung: Secondary | ICD-10-CM | POA: Insufficient documentation

## 2021-09-04 DIAGNOSIS — I2510 Atherosclerotic heart disease of native coronary artery without angina pectoris: Secondary | ICD-10-CM | POA: Diagnosis not present

## 2021-09-04 DIAGNOSIS — Z8719 Personal history of other diseases of the digestive system: Secondary | ICD-10-CM | POA: Insufficient documentation

## 2021-09-04 DIAGNOSIS — R519 Headache, unspecified: Secondary | ICD-10-CM | POA: Diagnosis not present

## 2021-09-04 DIAGNOSIS — Z888 Allergy status to other drugs, medicaments and biological substances status: Secondary | ICD-10-CM | POA: Diagnosis not present

## 2021-09-04 DIAGNOSIS — Z5111 Encounter for antineoplastic chemotherapy: Secondary | ICD-10-CM | POA: Diagnosis not present

## 2021-09-04 DIAGNOSIS — Z90710 Acquired absence of both cervix and uterus: Secondary | ICD-10-CM | POA: Insufficient documentation

## 2021-09-04 DIAGNOSIS — K219 Gastro-esophageal reflux disease without esophagitis: Secondary | ICD-10-CM | POA: Diagnosis not present

## 2021-09-04 DIAGNOSIS — J90 Pleural effusion, not elsewhere classified: Secondary | ICD-10-CM | POA: Insufficient documentation

## 2021-09-04 DIAGNOSIS — Z7952 Long term (current) use of systemic steroids: Secondary | ICD-10-CM | POA: Diagnosis not present

## 2021-09-04 DIAGNOSIS — E785 Hyperlipidemia, unspecified: Secondary | ICD-10-CM | POA: Diagnosis not present

## 2021-09-04 DIAGNOSIS — Z5112 Encounter for antineoplastic immunotherapy: Secondary | ICD-10-CM | POA: Diagnosis not present

## 2021-09-04 DIAGNOSIS — F32A Depression, unspecified: Secondary | ICD-10-CM | POA: Diagnosis not present

## 2021-09-04 DIAGNOSIS — C7931 Secondary malignant neoplasm of brain: Secondary | ICD-10-CM | POA: Diagnosis not present

## 2021-09-04 DIAGNOSIS — C3411 Malignant neoplasm of upper lobe, right bronchus or lung: Secondary | ICD-10-CM | POA: Diagnosis not present

## 2021-09-04 DIAGNOSIS — I10 Essential (primary) hypertension: Secondary | ICD-10-CM | POA: Diagnosis not present

## 2021-09-04 DIAGNOSIS — Z79899 Other long term (current) drug therapy: Secondary | ICD-10-CM | POA: Insufficient documentation

## 2021-09-04 DIAGNOSIS — Z87891 Personal history of nicotine dependence: Secondary | ICD-10-CM | POA: Diagnosis not present

## 2021-09-04 DIAGNOSIS — F419 Anxiety disorder, unspecified: Secondary | ICD-10-CM | POA: Diagnosis not present

## 2021-09-04 DIAGNOSIS — I700 Atherosclerosis of aorta: Secondary | ICD-10-CM | POA: Insufficient documentation

## 2021-09-04 DIAGNOSIS — Z902 Acquired absence of lung [part of]: Secondary | ICD-10-CM | POA: Insufficient documentation

## 2021-09-04 LAB — CBC WITH DIFFERENTIAL (CANCER CENTER ONLY)
Abs Immature Granulocytes: 0.07 10*3/uL (ref 0.00–0.07)
Basophils Absolute: 0.1 10*3/uL (ref 0.0–0.1)
Basophils Relative: 1 %
Eosinophils Absolute: 0 10*3/uL (ref 0.0–0.5)
Eosinophils Relative: 1 %
HCT: 34.2 % — ABNORMAL LOW (ref 36.0–46.0)
Hemoglobin: 11 g/dL — ABNORMAL LOW (ref 12.0–15.0)
Immature Granulocytes: 1 %
Lymphocytes Relative: 30 %
Lymphs Abs: 2 10*3/uL (ref 0.7–4.0)
MCH: 28.9 pg (ref 26.0–34.0)
MCHC: 32.2 g/dL (ref 30.0–36.0)
MCV: 89.8 fL (ref 80.0–100.0)
Monocytes Absolute: 0.7 10*3/uL (ref 0.1–1.0)
Monocytes Relative: 11 %
Neutro Abs: 3.7 10*3/uL (ref 1.7–7.7)
Neutrophils Relative %: 56 %
Platelet Count: 251 10*3/uL (ref 150–400)
RBC: 3.81 MIL/uL — ABNORMAL LOW (ref 3.87–5.11)
RDW: 13.3 % (ref 11.5–15.5)
WBC Count: 6.5 10*3/uL (ref 4.0–10.5)
nRBC: 0 % (ref 0.0–0.2)

## 2021-09-04 LAB — CMP (CANCER CENTER ONLY)
ALT: 18 U/L (ref 0–44)
AST: 20 U/L (ref 15–41)
Albumin: 4.2 g/dL (ref 3.5–5.0)
Alkaline Phosphatase: 66 U/L (ref 38–126)
Anion gap: 6 (ref 5–15)
BUN: 15 mg/dL (ref 8–23)
CO2: 31 mmol/L (ref 22–32)
Calcium: 10.2 mg/dL (ref 8.9–10.3)
Chloride: 102 mmol/L (ref 98–111)
Creatinine: 0.72 mg/dL (ref 0.44–1.00)
GFR, Estimated: >60 mL/min (ref >60)
Glucose, Bld: 112 mg/dL — ABNORMAL HIGH (ref 70–99)
Potassium: 3.8 mmol/L (ref 3.5–5.1)
Sodium: 139 mmol/L (ref 135–145)
Total Bilirubin: 0.2 mg/dL — ABNORMAL LOW (ref 0.3–1.2)
Total Protein: 7.2 g/dL (ref 6.5–8.1)

## 2021-09-04 MED ORDER — CYANOCOBALAMIN 1000 MCG/ML IJ SOLN
1000.0000 ug | Freq: Once | INTRAMUSCULAR | Status: AC
  Administered 2021-09-04: 1000 ug via INTRAMUSCULAR
  Filled 2021-09-04: qty 1

## 2021-09-04 MED ORDER — SODIUM CHLORIDE 0.9 % IV SOLN
200.0000 mg | Freq: Once | INTRAVENOUS | Status: AC
  Administered 2021-09-04: 200 mg via INTRAVENOUS
  Filled 2021-09-04: qty 200

## 2021-09-04 MED ORDER — SODIUM CHLORIDE 0.9 % IV SOLN
800.0000 mg | Freq: Once | INTRAVENOUS | Status: AC
  Administered 2021-09-04: 800 mg via INTRAVENOUS
  Filled 2021-09-04: qty 20

## 2021-09-04 MED ORDER — SODIUM CHLORIDE 0.9 % IV SOLN: Freq: Once | INTRAVENOUS | Status: AC

## 2021-09-04 MED ORDER — PROCHLORPERAZINE MALEATE 10 MG PO TABS
10.0000 mg | ORAL_TABLET | Freq: Once | ORAL | Status: AC
  Administered 2021-09-04: 10 mg via ORAL
  Filled 2021-09-04: qty 1

## 2021-09-04 NOTE — Patient Instructions (Signed)
[facility]  Discharge Instructions: Thank you for choosing [facility] to provide your oncology and hematology care.   If you have a lab appointment with the Cancer Center, please go directly to the Cancer Center and check in at the registration area.   Wear comfortable clothing and clothing appropriate for easy access to any Portacath or PICC line.   We strive to give you quality time with your provider. You may need to reschedule your appointment if you arrive late (15 or more minutes).  Arriving late affects you and other patients whose appointments are after yours.  Also, if you miss three or more appointments without notifying the office, you may be dismissed from the clinic at the provider's discretion.      For prescription refill requests, have your pharmacy contact our office and allow 72 hours for refills to be completed.    Today you received the following chemotherapy and/or immunotherapy agents Pembrolizumab (Keytruda) and Pemetrexed (Alimta)      To help prevent nausea and vomiting after your treatment, we encourage you to take your nausea medication as directed.  BELOW ARE SYMPTOMS THAT SHOULD BE REPORTED IMMEDIATELY: *FEVER GREATER THAN 100.4 F (38 C) OR HIGHER *CHILLS OR SWEATING *NAUSEA AND VOMITING THAT IS NOT CONTROLLED WITH YOUR NAUSEA MEDICATION *UNUSUAL SHORTNESS OF BREATH *UNUSUAL BRUISING OR BLEEDING *URINARY PROBLEMS (pain or burning when urinating, or frequent urination) *BOWEL PROBLEMS (unusual diarrhea, constipation, pain near the anus) TENDERNESS IN MOUTH AND THROAT WITH OR WITHOUT PRESENCE OF ULCERS (sore throat, sores in mouth, or a toothache) UNUSUAL RASH, SWELLING OR PAIN  UNUSUAL VAGINAL DISCHARGE OR ITCHING   Items with * indicate a potential emergency and should be followed up as soon as possible or go to the Emergency Department if any problems should occur.  Please show the CHEMOTHERAPY ALERT CARD or  IMMUNOTHERAPY ALERT CARD at check-in to the Emergency Department and triage nurse.  Should you have questions after your visit or need to cancel or reschedule your appointment, please contact [facility]  Dept: [phone]  and follow the prompts.  Office hours are 8:00 a.m. to 4:30 p.m. Monday - Friday. Please note that voicemails left after 4:00 p.m. may not be returned until the following business day.  We are closed weekends and major holidays. You have access to a nurse at all times for urgent questions. Please call the main number to the clinic Dept: [phone] and follow the prompts.   For any non-urgent questions, you may also contact your provider using MyChart. We now offer e-Visits for anyone 18 and older to request care online for non-urgent symptoms. For details visit [email or web address].   Also download the MyChart app! Go to the app store, search "MyChart", open the app, select , and log in with your MyChart username and password.  Due to Covid, a mask is required upon entering the hospital/clinic. If you do not have a mask, one will be given to you upon arrival. For doctor visits, patients may have 1 support person aged 18 or older with them. For treatment visits, patients cannot have anyone with them due to current Covid guidelines and our immunocompromised population.   

## 2021-09-07 ENCOUNTER — Other Ambulatory Visit: Payer: Self-pay | Admitting: Internal Medicine

## 2021-09-09 ENCOUNTER — Encounter: Payer: Self-pay | Admitting: Internal Medicine

## 2021-09-09 ENCOUNTER — Other Ambulatory Visit: Payer: Self-pay | Admitting: Internal Medicine

## 2021-09-09 NOTE — Telephone Encounter (Signed)
Patient was seen by her medical doctor. ?

## 2021-09-17 ENCOUNTER — Encounter: Payer: Self-pay | Admitting: Internal Medicine

## 2021-09-17 NOTE — Progress Notes (Signed)
? ? ? ? ?Subjective:  ? ? Patient ID: Hannah [name], female    DOB: May 28, 1951, 71 y.o.   MRN: [id number] ? ?This visit occurred during the SARS-CoV-2 public health emergency.  Safety protocols were in place, including screening questions prior to the visit, additional usage of staff PPE, and extensive cleaning of exam room while observing appropriate contact time as indicated for disinfecting solutions.   ? ? ?HPI ?Hannah [name] is here for follow up of her chronic medical problems, including hld, DM, GERD, depression, anxiety, metastatic lung cancer ? ?She is seeing Dr Hannah [name] for her depression and anxiety.  Her daughter would ideally like me to take over prescribing her medications if that is okay.  She is still anxious and uses the Xanax during the day.  She is sleeping well. ? ?She has been off her blood pressure medication and blood pressure has been stable/controlled. ? ?At times fluid retention.  She may have very mild swelling in her ankles.  At times she feels tightness in her legs, but no swelling there. ? ?Medications and allergies reviewed with patient and updated if appropriate. ? ?Current Outpatient Medications on File Prior to Visit  ?Medication Sig Dispense Refill  ? acetaminophen (TYLENOL) 500 MG tablet Take 2 tablets (1,000 mg total) by mouth every 6 (six) hours. 30 tablet 0  ? ALPRAZolam (XANAX XR) 0.5 MG 24 hr tablet Take one tablet (0.5 mg total) by mouth every night at bedtime. 30 tablet 5  ? ALPRAZolam (XANAX) 0.5 MG tablet Take one tablet (0.5 mg total) by mouth qam and may take one more tablet (0.5 mg total) qd prn for anxiety. 120 tablet 5  ? Ascorbic Acid (VITAMIN C) 1000 MG tablet Take 1,000 mg by mouth daily.    ? aspirin 81 MG tablet Take 1 tablet (81 mg total) by mouth daily. Restart on 08/31/20 30 tablet   ? bisacodyl (DULCOLAX) 5 MG EC tablet Take 5 mg by mouth daily as needed for moderate constipation.    ? carboxymethylcellulose (REFRESH PLUS) 0.5 % SOLN Place 1-2 drops into both eyes 3  (three) times daily as needed (dry eyes).    ? Cholecalciferol (D3 PO) Take 1 capsule by mouth daily.    ? citalopram (CELEXA) 10 MG tablet Take 1 tablet (10 mg total) by mouth daily. 30 tablet 1  ? dexamethasone (DECADRON) 1 MG tablet Take 1 tablet (1 mg total) by mouth daily. 60 tablet 1  ? fluticasone (FLONASE) 50 MCG/ACT nasal spray PLACE 2 SPRAYS INTO BOTH NOSTRILS DAILY. 48 g 1  ? folic acid (FOLVITE) 1 MG tablet Take 1 tablet (1 mg total) by mouth daily. 30 tablet 2  ? Lacosamide 100 MG TABS Take 1 tablet (100 mg total) by mouth 2 (two) times daily. 60 tablet 3  ? levETIRAcetam (KEPPRA) 100 MG/ML solution TAKE 15 MLS BY MOUTH 2 TIMES DAILY 473 mL 0  ? loratadine (CLARITIN) 10 MG tablet Take 10 mg by mouth daily as needed for allergies.    ? Menthol, Topical Analgesic, (BIOFREEZE EX) Apply 1 application topically as needed (pain).    ? mirtazapine (REMERON) 45 MG tablet Take 1 tablet (45 mg total) by mouth at bedtime. 30 tablet 3  ? olopatadine (PATANOL) 0.1 % ophthalmic solution Place 1 drop into both eyes 2 (two) times daily as needed for allergies.    ? omeprazole (PRILOSEC) 40 MG capsule TAKE 1 CAPSULE TWICE DAILY BEFORE MEALS 180 capsule 0  ? polyethylene glycol (MIRALAX /  GLYCOLAX) 17 g packet Take 17 g by mouth 2 (two) times daily. 72 each 0  ? pravastatin (PRAVACHOL) 40 MG tablet TAKE 1 TABLET (40 MG TOTAL) BY MOUTH EVERY EVENING. 90 tablet 0  ? Probiotic Product (PROBIOTIC DAILY PO) Take 1 capsule by mouth daily.    ? prochlorperazine (COMPAZINE) 10 MG tablet Take 1 tablet (10 mg total) by mouth every 6 (six) hours as needed for nausea or vomiting. 30 tablet 0  ? simethicone (MYLICON) 80 MG chewable tablet Chew 1 tablet (80 mg total) by mouth 4 (four) times daily as needed for flatulence (Bloating). 30 tablet 0  ? ?No current facility-administered medications on file prior to visit.  ? ? ? ?Review of Systems  ?Constitutional:  Negative for fever.  ?HENT:  Positive for congestion and postnasal drip.    ?Respiratory:  Positive for cough (? PND - allergy related) and shortness of breath. Negative for wheezing.   ?Cardiovascular:  Positive for leg swelling (occ - mild). Negative for chest pain and palpitations.  ?Gastrointestinal:  Positive for constipation. Negative for nausea.  ?Genitourinary:  Positive for frequency. Negative for dysuria and urgency.  ?     Urine not cloudy, no abd smell  ?Neurological:  Positive for light-headedness (occ). Negative for headaches.  ?Psychiatric/Behavioral:  Positive for confusion (at times this week) and dysphoric mood. The patient is nervous/anxious.   ? ?   ?Objective:  ? ?Vitals:  ? 09/18/21 0902  ?BP: (!) 142/70  ?Pulse: 74  ?Temp: 98.6 ?F (37 ?C)  ?SpO2: 98%  ? ?BP Readings from Last 3 Encounters:  ?09/18/21 (!) 142/70  ?09/04/21 (!) 142/69  ?08/30/21 120/60  ? ?Wt Readings from Last 3 Encounters:  ?09/18/21 137 lb 3.2 oz (62.2 kg)  ?09/04/21 134 lb 12.8 oz (61.1 kg)  ?08/30/21 133 lb (60.3 kg)  ? ?Body mass index is 26.8 kg/m?. ? ?  ?Physical Exam ?Constitutional:   ?   General: She is not in acute distress. ?   Appearance: Normal appearance.  ?HENT:  ?   Head: Normocephalic and atraumatic.  ?Eyes:  ?   Conjunctiva/sclera: Conjunctivae normal.  ?Cardiovascular:  ?   Rate and Rhythm: Normal rate and regular rhythm.  ?   Heart sounds: Normal heart sounds. No murmur heard. ?Pulmonary:  ?   Effort: Pulmonary effort is normal. No respiratory distress.  ?   Breath sounds: Normal breath sounds. No wheezing.  ?Musculoskeletal:  ?   Cervical back: Neck supple.  ?   Right lower leg: No edema.  ?   Left lower leg: No edema.  ?Lymphadenopathy:  ?   Cervical: No cervical adenopathy.  ?Skin: ?   Findings: No rash.  ?Neurological:  ?   Mental Status: She is alert. Mental status is at baseline.  ?Psychiatric:     ?   Mood and Affect: Mood normal.     ?   Behavior: Behavior normal.  ? ?   ? ?Lab Results  ?Component Value Date  ? WBC 6.5 09/04/2021  ? HGB 11.0 (L) 09/04/2021  ? HCT 34.2 (L)  09/04/2021  ? PLT 251 09/04/2021  ? GLUCOSE 112 (H) 09/04/2021  ? CHOL 171 03/20/2021  ? TRIG 286.0 (H) 03/20/2021  ? HDL 46.90 03/20/2021  ? LDLDIRECT 73.0 03/20/2021  ? [place] 62 03/14/2020  ? ALT 18 09/04/2021  ? AST 20 09/04/2021  ? NA 139 09/04/2021  ? K 3.8 09/04/2021  ? CL 102 09/04/2021  ? CREATININE 0.72 09/04/2021  ?  BUN 15 09/04/2021  ? CO2 31 09/04/2021  ? TSH 1.153 07/24/2021  ? INR 1.0 02/12/2021  ? HGBA1C 6.2 (A) 09/18/2021  ? HGBA1C 6.2 09/18/2021  ? HGBA1C 6.2 09/18/2021  ? HGBA1C 6.2 09/18/2021  ? MICROALBUR <0.7 03/20/2021  ? ? ? ?Assessment & Plan:  ? ? ?See Problem List for Assessment and Plan of chronic medical problems.  ? ? ?

## 2021-09-17 NOTE — Patient Instructions (Addendum)
? ? ?  Your a1c was checked today.   ? ? ? ? ? ?Medications changes include :  none  ? ? ? ? ? ?Return in about 6 months (around 03/21/2022) for follow up. ? ?

## 2021-09-18 ENCOUNTER — Telehealth: Payer: Self-pay

## 2021-09-18 ENCOUNTER — Ambulatory Visit: Payer: Medicare HMO | Admitting: Internal Medicine

## 2021-09-18 ENCOUNTER — Telehealth (HOSPITAL_COMMUNITY): Payer: Self-pay | Admitting: Psychiatry

## 2021-09-18 ENCOUNTER — Other Ambulatory Visit: Payer: Self-pay

## 2021-09-18 VITALS — BP 142/70 | HR 74 | Temp 98.6°F | Ht 60.0 in | Wt 137.2 lb

## 2021-09-18 DIAGNOSIS — R350 Frequency of micturition: Secondary | ICD-10-CM | POA: Diagnosis not present

## 2021-09-18 DIAGNOSIS — E782 Mixed hyperlipidemia: Secondary | ICD-10-CM | POA: Diagnosis not present

## 2021-09-18 DIAGNOSIS — C3490 Malignant neoplasm of unspecified part of unspecified bronchus or lung: Secondary | ICD-10-CM | POA: Insufficient documentation

## 2021-09-18 DIAGNOSIS — F3289 Other specified depressive episodes: Secondary | ICD-10-CM | POA: Diagnosis not present

## 2021-09-18 DIAGNOSIS — C3491 Malignant neoplasm of unspecified part of right bronchus or lung: Secondary | ICD-10-CM

## 2021-09-18 DIAGNOSIS — E119 Type 2 diabetes mellitus without complications: Secondary | ICD-10-CM | POA: Diagnosis not present

## 2021-09-18 DIAGNOSIS — G479 Sleep disorder, unspecified: Secondary | ICD-10-CM

## 2021-09-18 DIAGNOSIS — I10 Essential (primary) hypertension: Secondary | ICD-10-CM

## 2021-09-18 DIAGNOSIS — F419 Anxiety disorder, unspecified: Secondary | ICD-10-CM | POA: Diagnosis not present

## 2021-09-18 DIAGNOSIS — C7931 Secondary malignant neoplasm of brain: Secondary | ICD-10-CM | POA: Insufficient documentation

## 2021-09-18 LAB — URINALYSIS, ROUTINE W REFLEX MICROSCOPIC
Bilirubin Urine: NEGATIVE
Hgb urine dipstick: NEGATIVE
Ketones, ur: NEGATIVE
Nitrite: NEGATIVE
RBC / HPF: NONE SEEN (ref 0–?)
Specific Gravity, Urine: 1.025 (ref 1.000–1.030)
Total Protein, Urine: NEGATIVE
Urine Glucose: NEGATIVE
Urobilinogen, UA: 0.2 (ref 0.0–1.0)
pH: 6 (ref 5.0–8.0)

## 2021-09-18 LAB — POCT GLYCOSYLATED HEMOGLOBIN (HGB A1C)
HbA1c POC (<> result, manual entry): 6.2 % (ref 4.0–5.6)
HbA1c, POC (controlled diabetic range): 6.2 % (ref 0.0–7.0)
HbA1c, POC (prediabetic range): 6.2 % (ref 5.7–6.4)
Hemoglobin A1C: 6.2 % — AB (ref 4.0–5.6)

## 2021-09-18 NOTE — Assessment & Plan Note (Addendum)
Chronic ?Fairly controlled ?I will start prescribing her meds - her daughter does not feel she still needs to see him ?Continue celexa 10 mg daily, remeron 45 mg nightly ?

## 2021-09-18 NOTE — Assessment & Plan Note (Signed)
Chronic ?Regular exercise and healthy diet encouraged ?Lipids have been controlled ?Continue pravastatin 40 mg daily ?

## 2021-09-18 NOTE — Assessment & Plan Note (Signed)
Chronic ?Following with oncology, neuro-oncology ? ?

## 2021-09-18 NOTE — Assessment & Plan Note (Addendum)
Chronic ?Not ideally controlled ?I will start prescribing her medications ?Continue celexa 10 mg daily, remeron 45 mg nightly ?Continue xanax XR - taking it nightly - can try this during the day or try a steady dose during the day -- not ideal to control her anxiety, but she is dependent on it ?

## 2021-09-18 NOTE — Telephone Encounter (Signed)
Daughter has questions to ask for this appt today. She isn't sure that she will make it. She will be available to FT or call during the appt. ? ?Questions are: ?Can anything be given for fluid retention? ?Can she be tested for UTI? No burning but has confusion ?She takes Xanax XR at bedtime but wonder can she take that during the day and the Xanax on a PRN basis? ? ?

## 2021-09-18 NOTE — Assessment & Plan Note (Addendum)
Chronic ?Diet controlled ?We will check A1c today ? ?Lab Results  ?Component Value Date  ? HGBA1C 6.2 (A) 09/18/2021  ? HGBA1C 6.2 09/18/2021  ? HGBA1C 6.2 09/18/2021  ? HGBA1C 6.2 09/18/2021  ? ?Well-controlled ?

## 2021-09-18 NOTE — Assessment & Plan Note (Signed)
Acute ?Mild ?Check UA, Ucx ?

## 2021-09-18 NOTE — Assessment & Plan Note (Addendum)
Chronic ?Sleeping well ?Taking mirtazapine 45 mg nightly, continue ? ?

## 2021-09-20 ENCOUNTER — Ambulatory Visit
Admission: RE | Admit: 2021-09-20 | Discharge: 2021-09-20 | Disposition: A | Discharge status: Home / Self Care | Payer: Medicare HMO | Source: Ambulatory Visit | Attending: Internal Medicine | Admitting: Internal Medicine

## 2021-09-20 ENCOUNTER — Other Ambulatory Visit: Payer: Self-pay

## 2021-09-20 DIAGNOSIS — G936 Cerebral edema: Secondary | ICD-10-CM | POA: Diagnosis not present

## 2021-09-20 DIAGNOSIS — C7931 Secondary malignant neoplasm of brain: Secondary | ICD-10-CM | POA: Diagnosis not present

## 2021-09-20 LAB — URINE CULTURE

## 2021-09-20 MED ORDER — GADOBENATE DIMEGLUMINE 529 MG/ML IV SOLN
10.0000 mL | Freq: Once | INTRAVENOUS | Status: AC | PRN
  Administered 2021-09-20: 10 mL via INTRAVENOUS

## 2021-09-20 MED ORDER — CEPHALEXIN 500 MG PO CAPS: 500.0000 mg | ORAL_CAPSULE | Freq: Two times a day (BID) | ORAL | 0 refills | Status: DC

## 2021-09-20 NOTE — Addendum Note (Signed)
Addended by: [name] on: 09/20/2021 04:20 PM ? ? Modules accepted: Orders ? ?

## 2021-09-23 ENCOUNTER — Inpatient Hospital Stay: Payer: Medicare HMO

## 2021-09-24 ENCOUNTER — Ambulatory Visit: Payer: Medicare HMO | Admitting: Internal Medicine

## 2021-09-24 ENCOUNTER — Other Ambulatory Visit: Payer: Self-pay | Admitting: Internal Medicine

## 2021-09-24 NOTE — Progress Notes (Signed)
[facility] ?OFFICE PROGRESS NOTE ? ?[name], MD ?[place] [address] ? ?DIAGNOSIS: Stage IV non-small cell lung cancer (T3, N0, M1C) adenocarcinoma.  The patient presented with a right upper lobe lung mass and solitary brain metastasis.  The patient was diagnosed in February 2022. ?  ?Biomarker Findings ?Microsatellite status - MS-Stable ?Tumor Mutational Burden - 8 Muts/Mb ?Genomic Findings ?For a complete list of the genes assayed, please refer to the Appendix. ?KRAS G12V ?KEAP1 S224F ?TP53 P151T ?7 Disease relevant genes with no reportable ?alterations: ALK, BRAF, EGFR, ERBB2, MET, RET, ROS1 ?  ?PDL1 Expression: 90% ? ?PRIOR THERAPY: 1) SRS to the metastatic brain lesion on 08/23/2020 under the care of Dr. Tammi Klippel and craniotomy under the care of Dr. Zada Finders on 08/24/2020. ?2) S/p robotic assisted right upper lobectomy with en bloc wedge resection of the right middle lobe and lymph node dissection under the care of Dr. Roxan Hockey on September 24, 2020 ?3) SRS to the two new subcentimeter metastases under the care of Dr. Tammi Klippel on 11/29/20.  ? ?CURRENT THERAPY: Systemic chemotherapy with carboplatin for AUC of 5, Alimta 500 Mg/M2 and Keytruda 200 mg IV every 3 weeks.  First dose Nov 08, 2020. Status post 14 cycles.  Starting from cycle #5, the patient started maintenance Alimta and Keytruda. ? ?INTERVAL HISTORY: ?Hannah [name] 71 y.o. female returns to the clinic today for a follow-up visit.  The patient is accompanied by her daughter.  The patient is feeling well today without any concerning complaints except anxiety.  In the interval since last being seen, the patient had a follow-up visit with her primary care provider regarding anxiety, depression, GERD, hyperlipidemia, and mild lower extremity swelling. She reports her legs feel tight without any swelling, erythema, or pain. I also do not see substantial/significant swelling today. Her PCP recommended avoiding salty  foods.  ? ?The patient also recently restarted her Decadron due to increased headaches. She states she is on 1 mg of decadron daily. She had a repeat brain MRI on 09/23/2021 which showed some increase edema of the previously treated brain lesion.  She is scheduled to see Dr. Mickeal Skinner today. Her headaches have improved since starting decadron.   ? ?Otherwise she is tolerating her treatment with Keytruda and Alimta well without any concerning adverse side effects.  Denies any fever, chills, night sweats, unexplained weight loss.  Denies any shortness of breath or cough except when she previously aspirated on one of her medications prior. Denies any chest discomfort or hemoptysis.  Denies any nausea, vomiting, diarrhea, or constipation. She is presently taking an antibiotic for a UTI. She has been taking this for 3 days. She is wondering if her treatment can cause "popping" of her joints. The patient denies any rashes or skin changes.  She is here today for evaluation and repeat blood work before starting cycle #15. ? ?MEDICAL HISTORY: ?Past Medical History:  ?Diagnosis Date  ? Allergy   ? Anemia   ? Anxiety   ? Arthritis   ? Carpal tunnel syndrome   ? Constipation   ? senna C stool softeners help   ? Depression   ? Diverticulosis   ? Dyslipidemia   ? External hemorrhoids   ? GERD (gastroesophageal reflux disease)   ? Heart murmur   ? mild-moderate AR  ? Hiatal hernia   ? Hyperlipidemia   ? on meds   ? Hypertension   ? Internal hemorrhoids   ? lung ca  with brain mets 07/2020  ? Osteoarthritis   ? Pre-diabetes   ? PVD (peripheral vascular disease) ([place])   ? moderate carotid disease  ? RBBB   ? Smoker   ? Vocal cord polyps   ? ? ?ALLERGIES:  is allergic to amlodipine, chantix [varenicline tartrate], clarithromycin, lisinopril, simvastatin, wellbutrin [bupropion hcl], lipitor [atorvastatin], and sertraline. ? ?MEDICATIONS:  ?Current Outpatient Medications  ?Medication Sig Dispense Refill  ? acetaminophen (TYLENOL) 500 MG  tablet Take 2 tablets (1,000 mg total) by mouth every 6 (six) hours. 30 tablet 0  ? ALPRAZolam (XANAX XR) 0.5 MG 24 hr tablet Take one tablet (0.5 mg total) by mouth every night at bedtime. 30 tablet 5  ? ALPRAZolam (XANAX) 0.5 MG tablet Take one tablet (0.5 mg total) by mouth qam and may take one more tablet (0.5 mg total) qd prn for anxiety. 120 tablet 5  ? Ascorbic Acid (VITAMIN C) 1000 MG tablet Take 1,000 mg by mouth daily.    ? aspirin 81 MG tablet Take 1 tablet (81 mg total) by mouth daily. Restart on 08/31/20 30 tablet   ? bisacodyl (DULCOLAX) 5 MG EC tablet Take 5 mg by mouth daily as needed for moderate constipation.    ? carboxymethylcellulose (REFRESH PLUS) 0.5 % SOLN Place 1-2 drops into both eyes 3 (three) times daily as needed (dry eyes).    ? cephALEXin (KEFLEX) 500 MG capsule Take 1 capsule (500 mg total) by mouth 2 (two) times daily. 14 capsule 0  ? Cholecalciferol (D3 PO) Take 1 capsule by mouth daily.    ? citalopram (CELEXA) 10 MG tablet Take 1 tablet (10 mg total) by mouth daily. 30 tablet 1  ? dexamethasone (DECADRON) 1 MG tablet Take 1 tablet (1 mg total) by mouth daily. 60 tablet 1  ? fluticasone (FLONASE) 50 MCG/ACT nasal spray PLACE 2 SPRAYS INTO BOTH NOSTRILS DAILY. 48 g 1  ? folic acid (FOLVITE) 1 MG tablet Take 1 tablet (1 mg total) by mouth daily. 30 tablet 2  ? Lacosamide 100 MG TABS Take 1 tablet (100 mg total) by mouth 2 (two) times daily. 60 tablet 3  ? levETIRAcetam (KEPPRA) 100 MG/ML solution TAKE 15 ML BY MOUTH TWICE DAILY 473 mL 0  ? loratadine (CLARITIN) 10 MG tablet Take 10 mg by mouth daily as needed for allergies.    ? Menthol, Topical Analgesic, (BIOFREEZE EX) Apply 1 application topically as needed (pain).    ? mirtazapine (REMERON) 45 MG tablet Take 1 tablet (45 mg total) by mouth at bedtime. 30 tablet 3  ? olopatadine (PATANOL) 0.1 % ophthalmic solution Place 1 drop into both eyes 2 (two) times daily as needed for allergies.    ? omeprazole (PRILOSEC) 40 MG capsule TAKE 1  CAPSULE TWICE DAILY BEFORE MEALS 180 capsule 0  ? polyethylene glycol (MIRALAX / GLYCOLAX) 17 g packet Take 17 g by mouth 2 (two) times daily. 72 each 0  ? pravastatin (PRAVACHOL) 40 MG tablet TAKE 1 TABLET (40 MG TOTAL) BY MOUTH EVERY EVENING. 90 tablet 0  ? Probiotic Product (PROBIOTIC DAILY PO) Take 1 capsule by mouth daily.    ? prochlorperazine (COMPAZINE) 10 MG tablet Take 1 tablet (10 mg total) by mouth every 6 (six) hours as needed for nausea or vomiting. 30 tablet 0  ? simethicone (MYLICON) 80 MG chewable tablet Chew 1 tablet (80 mg total) by mouth 4 (four) times daily as needed for flatulence (Bloating). 30 tablet 0  ? ?No current facility-administered medications  for this visit.  ? ? ?SURGICAL HISTORY:  ?Past Surgical History:  ?Procedure Laterality Date  ? ABDOMINAL HYSTERECTOMY  1994  ? APPLICATION OF CRANIAL NAVIGATION N/A 08/24/2020  ? Procedure: APPLICATION OF CRANIAL NAVIGATION;  Surgeon: Judith Part, MD;  Location: [facility];  Service: Neurosurgery;  Laterality: N/A;  ? COLONOSCOPY    ? COLONOSCOPY W/ POLYPECTOMY  2009  ? CRANIOTOMY Left 08/24/2020  ? Procedure: Left Craniotomy for Tumor Resection with Brainlab;  Surgeon: Judith Part, MD;  Location: [facility];  Service: Neurosurgery;  Laterality: Left;  ? HEMORRHOID SURGERY    ? INTERCOSTAL NERVE BLOCK Right 09/24/2020  ? Procedure: INTERCOSTAL NERVE BLOCK;  Surgeon: Melrose Nakayama, MD;  Location: [facility];  Service: Thoracic;  Laterality: Right;  ? JOINT REPLACEMENT    ? LYMPH NODE DISSECTION Right 09/24/2020  ? Procedure: LYMPH NODE DISSECTION;  Surgeon: Melrose Nakayama, MD;  Location: [facility];  Service: Thoracic;  Laterality: Right;  ? POLYPECTOMY    ? right total knee arthroplasty    ? Dr. Emeterio Reeve 06-04-18  ? SPINE SURGERY  08/16/2009  ? TOTAL KNEE ARTHROPLASTY Left 02/13/2014  ? Procedure: TOTAL KNEE ARTHROPLASTY;  Surgeon: Alta Corning, MD;  Location: [facility];  Service: Orthopedics;  Laterality: Left;  ? TOTAL KNEE ARTHROPLASTY Right  06/04/2018  ? Procedure: RIGHT TOTAL KNEE ARTHROPLASTY;  Surgeon: Dorna Leitz, MD;  Location: [facility];  Service: Orthopedics;  Laterality: Right;  Adductor Block  ? TUBAL LIGATION    ? ? ?REVIEW OF SYSTEMS

## 2021-09-25 ENCOUNTER — Ambulatory Visit: Payer: Medicare HMO

## 2021-09-25 ENCOUNTER — Other Ambulatory Visit: Payer: Medicare HMO

## 2021-09-25 ENCOUNTER — Ambulatory Visit: Payer: Medicare HMO | Admitting: Internal Medicine

## 2021-09-26 ENCOUNTER — Inpatient Hospital Stay: Payer: Medicare HMO

## 2021-09-26 ENCOUNTER — Other Ambulatory Visit: Payer: Self-pay

## 2021-09-26 ENCOUNTER — Inpatient Hospital Stay: Payer: Medicare HMO | Admitting: Internal Medicine

## 2021-09-26 ENCOUNTER — Inpatient Hospital Stay: Payer: Medicare HMO | Admitting: Physician Assistant

## 2021-09-26 VITALS — BP 122/66 | HR 68 | Temp 97.7°F | Resp 20 | Wt 137.5 lb

## 2021-09-26 DIAGNOSIS — C3411 Malignant neoplasm of upper lobe, right bronchus or lung: Secondary | ICD-10-CM

## 2021-09-26 DIAGNOSIS — R21 Rash and other nonspecific skin eruption: Secondary | ICD-10-CM | POA: Diagnosis not present

## 2021-09-26 DIAGNOSIS — C3491 Malignant neoplasm of unspecified part of right bronchus or lung: Secondary | ICD-10-CM

## 2021-09-26 DIAGNOSIS — I700 Atherosclerosis of aorta: Secondary | ICD-10-CM | POA: Diagnosis not present

## 2021-09-26 DIAGNOSIS — I2510 Atherosclerotic heart disease of native coronary artery without angina pectoris: Secondary | ICD-10-CM | POA: Diagnosis not present

## 2021-09-26 DIAGNOSIS — Z5111 Encounter for antineoplastic chemotherapy: Secondary | ICD-10-CM | POA: Diagnosis not present

## 2021-09-26 DIAGNOSIS — J984 Other disorders of lung: Secondary | ICD-10-CM | POA: Diagnosis not present

## 2021-09-26 DIAGNOSIS — Z5112 Encounter for antineoplastic immunotherapy: Secondary | ICD-10-CM | POA: Diagnosis not present

## 2021-09-26 DIAGNOSIS — J90 Pleural effusion, not elsewhere classified: Secondary | ICD-10-CM | POA: Diagnosis not present

## 2021-09-26 DIAGNOSIS — C7931 Secondary malignant neoplasm of brain: Secondary | ICD-10-CM

## 2021-09-26 LAB — CMP (CANCER CENTER ONLY)
ALT: 26 U/L (ref 0–44)
AST: 24 U/L (ref 15–41)
Albumin: 3.9 g/dL (ref 3.5–5.0)
Alkaline Phosphatase: 55 U/L (ref 38–126)
Anion gap: 8 (ref 5–15)
BUN: 19 mg/dL (ref 8–23)
CO2: 28 mmol/L (ref 22–32)
Calcium: 9.5 mg/dL (ref 8.9–10.3)
Chloride: 104 mmol/L (ref 98–111)
Creatinine: 0.83 mg/dL (ref 0.44–1.00)
GFR, Estimated: >60 mL/min (ref >60)
Glucose, Bld: 122 mg/dL — ABNORMAL HIGH (ref 70–99)
Potassium: 3.9 mmol/L (ref 3.5–5.1)
Sodium: 140 mmol/L (ref 135–145)
Total Bilirubin: 0.3 mg/dL (ref 0.3–1.2)
Total Protein: 6.8 g/dL (ref 6.5–8.1)

## 2021-09-26 LAB — CBC WITH DIFFERENTIAL (CANCER CENTER ONLY)
Abs Immature Granulocytes: 0.04 10*3/uL (ref 0.00–0.07)
Basophils Absolute: 0 10*3/uL (ref 0.0–0.1)
Basophils Relative: 1 %
Eosinophils Absolute: 0 10*3/uL (ref 0.0–0.5)
Eosinophils Relative: 1 %
HCT: 36.8 % (ref 36.0–46.0)
Hemoglobin: 11.5 g/dL — ABNORMAL LOW (ref 12.0–15.0)
Immature Granulocytes: 1 %
Lymphocytes Relative: 42 %
Lymphs Abs: 2.5 10*3/uL (ref 0.7–4.0)
MCH: 28.5 pg (ref 26.0–34.0)
MCHC: 31.3 g/dL (ref 30.0–36.0)
MCV: 91.1 fL (ref 80.0–100.0)
Monocytes Absolute: 0.7 10*3/uL (ref 0.1–1.0)
Monocytes Relative: 12 %
Neutro Abs: 2.7 10*3/uL (ref 1.7–7.7)
Neutrophils Relative %: 43 %
Platelet Count: 241 10*3/uL (ref 150–400)
RBC: 4.04 MIL/uL (ref 3.87–5.11)
RDW: 13.3 % (ref 11.5–15.5)
WBC Count: 5.9 10*3/uL (ref 4.0–10.5)
nRBC: 0 % (ref 0.0–0.2)

## 2021-09-26 LAB — TSH: TSH: 1.245 u[IU]/mL (ref 0.308–3.960)

## 2021-09-26 MED ORDER — PROCHLORPERAZINE MALEATE 10 MG PO TABS
10.0000 mg | ORAL_TABLET | Freq: Once | ORAL | Status: AC
  Administered 2021-09-26: 10 mg via ORAL
  Filled 2021-09-26: qty 1

## 2021-09-26 MED ORDER — SODIUM CHLORIDE 0.9 % IV SOLN: Freq: Once | INTRAVENOUS | Status: AC

## 2021-09-26 MED ORDER — CYANOCOBALAMIN 1000 MCG/ML IJ SOLN: 1000.0000 ug | Freq: Once | INTRAMUSCULAR | Status: DC

## 2021-09-26 MED ORDER — CYANOCOBALAMIN 1000 MCG/ML IJ SOLN
INTRAMUSCULAR | Status: AC
  Filled 2021-09-26: qty 1

## 2021-09-26 MED ORDER — SODIUM CHLORIDE 0.9 % IV SOLN
520.0000 mg/m2 | Freq: Once | INTRAVENOUS | Status: AC
  Administered 2021-09-26: 800 mg via INTRAVENOUS
  Filled 2021-09-26: qty 12

## 2021-09-26 MED ORDER — FOLIC ACID 1 MG PO TABS: 1.0000 mg | ORAL_TABLET | Freq: Every day | ORAL | 2 refills | Status: DC

## 2021-09-26 MED ORDER — SODIUM CHLORIDE 0.9 % IV SOLN
200.0000 mg | Freq: Once | INTRAVENOUS | Status: AC
  Administered 2021-09-26: 200 mg via INTRAVENOUS
  Filled 2021-09-26: qty 200

## 2021-09-26 MED ORDER — DEXAMETHASONE 1 MG PO TABS: 1.0000 mg | ORAL_TABLET | Freq: Every day | ORAL | 1 refills | Status: DC

## 2021-09-26 NOTE — Patient Instructions (Signed)
[facility]  Discharge Instructions: ?Thank you for choosing [facility] to provide your oncology and hematology care.  ? ?If you have a lab appointment with the [facility], please go directly to the [facility] and check in at the registration area. ?  ?Wear comfortable clothing and clothing appropriate for easy access to any Portacath or PICC line.  ? ?We strive to give you quality time with your provider. You may need to reschedule your appointment if you arrive late (15 or more minutes).  Arriving late affects you and other patients whose appointments are after yours.  Also, if you miss three or more appointments without notifying the office, you may be dismissed from the clinic at the provider?s discretion.    ?  ?For prescription refill requests, have your pharmacy contact our office and allow 72 hours for refills to be completed.   ? ?Today you received the following chemotherapy and/or immunotherapy agents: Keytruda, Alimta, B12    ?  ?To help prevent nausea and vomiting after your treatment, we encourage you to take your nausea medication as directed. ? ?BELOW ARE SYMPTOMS THAT SHOULD BE REPORTED IMMEDIATELY: ?*FEVER GREATER THAN 100.4 F (38 ?C) OR HIGHER ?*CHILLS OR SWEATING ?*NAUSEA AND VOMITING THAT IS NOT CONTROLLED WITH YOUR NAUSEA MEDICATION ?*UNUSUAL SHORTNESS OF BREATH ?*UNUSUAL BRUISING OR BLEEDING ?*URINARY PROBLEMS (pain or burning when urinating, or frequent urination) ?*BOWEL PROBLEMS (unusual diarrhea, constipation, pain near the anus) ?TENDERNESS IN MOUTH AND THROAT WITH OR WITHOUT PRESENCE OF ULCERS (sore throat, sores in mouth, or a toothache) ?UNUSUAL RASH, SWELLING OR PAIN  ?UNUSUAL VAGINAL DISCHARGE OR ITCHING  ? ?Items with * indicate a potential emergency and should be followed up as soon as possible or go to the Emergency Department if any problems should occur. ? ?Please show the CHEMOTHERAPY ALERT CARD or IMMUNOTHERAPY ALERT CARD at  check-in to the Emergency Department and triage nurse. ? ?Should you have questions after your visit or need to cancel or reschedule your appointment, please contact [facility]  Dept: [phone]  and follow the prompts.  Office hours are 8:00 a.m. to 4:30 p.m. Monday - Friday. Please note that voicemails left after 4:00 p.m. may not be returned until the following business day.  We are closed weekends and major holidays. You have access to a nurse at all times for urgent questions. Please call the main number to the clinic Dept: [phone] and follow the prompts. ? ? ?For any non-urgent questions, you may also contact your provider using MyChart. We now offer e-Visits for anyone 78 and older to request care online for non-urgent symptoms. For details visit [email or web address]. ?  ?Also download the MyChart app! Go to the app store, search "MyChart", open the app, select [facility], and log in with your MyChart username and password. ? ?Due to Covid, a mask is required upon entering the hospital/clinic. If you do not have a mask, one will be given to you upon arrival. For doctor visits, patients may have 1 support person aged 65 or older with them. For treatment visits, patients cannot have anyone with them due to current Covid guidelines and our immunocompromised population.  ? ?

## 2021-09-26 NOTE — Progress Notes (Signed)
? ?[facility] at [address]  ?[place], [place] [address] ?[phone] ? ? ?Interval Evaluation ? ?Date of Service: 09/26/21 ?Patient Name: Hannah [name] ?Patient MRN: [id number] ?Patient DOB: May 02, 1951 ?Provider: Ventura Sellers, MD ? ?Identifying Statement:  ?Hannah [name] is a 71 y.o. female with brain metastasis  ? ?Primary Cancer:  ?Oncology History  ?Primary cancer of right upper lobe of lung ([place])  ?08/07/2020 Initial Diagnosis  ? Primary cancer of right upper lobe of lung [facility]) ?  ?09/20/2020 Cancer Staging  ? Staging form: Lung, AJCC 8th Edition ?- Clinical: Stage IVB (cT3, cN0, cM1c) - Signed by Curt Bears, MD on 09/20/2020 ? ?  ?11/08/2020 -  Chemotherapy  ? Patient is on Treatment Plan : LUNG CARBOplatin / Pemetrexed / Pembrolizumab q21d Induction x 4 cycles / Maintenance Pemetrexed + Pembrolizumab  ?   ? ?Oncologic History ?08/23/20: Pre-op SRS to L frontal mass Tammi Klippel) ?08/24/20: Craniotomy, resection ([place]) ?11/29/20: Salv[facility] SRS x2 Tammi Klippel) ? ?Interval History: ? Hannah [name] presents today for follow up after recent MRI brain.  No significant change in burden of neurologic symptoms today.  Right sided weakness still has not returned.  She continues on keppra and vimpat without frank seizures.  Decadron currently at 1mg  daily.  Continues on Pem/Pem with Dr. Julien Nordmann. ? ?H+P (08/02/20) Patient presented to medical attention this past week with several days rapidly progressive speech impairment and right sided weakness.  She and her family describe difficulty putting complete sentences together, impaired use of right arm (for using fork, zipper, brushing teeth), and dragging of the right leg while walking.  She never needed assistance to walk.  CNS imaging demonstrated left frontal mass, and decadron was started over the weekend 4mg  twice per day.  She feels improved with regards to the right sided weakness with the steroids.  Otherwise no seizures,  headaches. ? ?Medications: ?Current Outpatient Medications on File Prior to Visit  ?Medication Sig Dispense Refill  ? acetaminophen (TYLENOL) 500 MG tablet Take 2 tablets (1,000 mg total) by mouth every 6 (six) hours. 30 tablet 0  ? ALPRAZolam (XANAX XR) 0.5 MG 24 hr tablet Take one tablet (0.5 mg total) by mouth every night at bedtime. 30 tablet 5  ? ALPRAZolam (XANAX) 0.5 MG tablet Take one tablet (0.5 mg total) by mouth qam and may take one more tablet (0.5 mg total) qd prn for anxiety. 120 tablet 5  ? Ascorbic Acid (VITAMIN C) 1000 MG tablet Take 1,000 mg by mouth daily.    ? aspirin 81 MG tablet Take 1 tablet (81 mg total) by mouth daily. Restart on 08/31/20 30 tablet   ? bisacodyl (DULCOLAX) 5 MG EC tablet Take 5 mg by mouth daily as needed for moderate constipation.    ? carboxymethylcellulose (REFRESH PLUS) 0.5 % SOLN Place 1-2 drops into both eyes 3 (three) times daily as needed (dry eyes).    ? cephALEXin (KEFLEX) 500 MG capsule Take 1 capsule (500 mg total) by mouth 2 (two) times daily. 14 capsule 0  ? Cholecalciferol (D3 PO) Take 1 capsule by mouth daily.    ? citalopram (CELEXA) 10 MG tablet Take 1 tablet (10 mg total) by mouth daily. 30 tablet 1  ? dexamethasone (DECADRON) 1 MG tablet Take 1 tablet (1 mg total) by mouth daily. 60 tablet 1  ? fluticasone (FLONASE) 50 MCG/ACT nasal spray PLACE 2 SPRAYS INTO BOTH NOSTRILS DAILY. 48 g 1  ? folic acid (FOLVITE)  1 MG tablet Take 1 tablet (1 mg total) by mouth daily. 30 tablet 2  ? Lacosamide 100 MG TABS Take 1 tablet (100 mg total) by mouth 2 (two) times daily. 60 tablet 3  ? levETIRAcetam (KEPPRA) 100 MG/ML solution TAKE 15 ML BY MOUTH TWICE DAILY 473 mL 0  ? loratadine (CLARITIN) 10 MG tablet Take 10 mg by mouth daily as needed for allergies.    ? Menthol, Topical Analgesic, (BIOFREEZE EX) Apply 1 application topically as needed (pain).    ? mirtazapine (REMERON) 45 MG tablet Take 1 tablet (45 mg total) by mouth at bedtime. 30 tablet 3  ? olopatadine  (PATANOL) 0.1 % ophthalmic solution Place 1 drop into both eyes 2 (two) times daily as needed for allergies.    ? omeprazole (PRILOSEC) 40 MG capsule TAKE 1 CAPSULE TWICE DAILY BEFORE MEALS 180 capsule 0  ? polyethylene glycol (MIRALAX / GLYCOLAX) 17 g packet Take 17 g by mouth 2 (two) times daily. 72 each 0  ? pravastatin (PRAVACHOL) 40 MG tablet TAKE 1 TABLET (40 MG TOTAL) BY MOUTH EVERY EVENING. 90 tablet 0  ? Probiotic Product (PROBIOTIC DAILY PO) Take 1 capsule by mouth daily.    ? prochlorperazine (COMPAZINE) 10 MG tablet Take 1 tablet (10 mg total) by mouth every 6 (six) hours as needed for nausea or vomiting. 30 tablet 0  ? simethicone (MYLICON) 80 MG chewable tablet Chew 1 tablet (80 mg total) by mouth 4 (four) times daily as needed for flatulence (Bloating). 30 tablet 0  ? ?No current facility-administered medications on file prior to visit.  ? ? ?Allergies:  ?Allergies  ?Allergen Reactions  ? Amlodipine Swelling and Rash  ?  Rash, swelling  ? Chantix [Varenicline Tartrate] Shortness Of Breath, Swelling and Other (See Comments)  ?  Tongue swell,sob  ? Clarithromycin Rash  ? Lisinopril Hives  ? Simvastatin Hives  ? Wellbutrin [Bupropion Hcl] Hives  ? Lipitor [Atorvastatin]   ?  Dizziness per patient  ? Sertraline   ?  Makes her feel like she is going to kill someone  ? ?Past Medical History:  ?Past Medical History:  ?Diagnosis Date  ? Allergy   ? Anemia   ? Anxiety   ? Arthritis   ? Carpal tunnel syndrome   ? Constipation   ? senna C stool softeners help   ? Depression   ? Diverticulosis   ? Dyslipidemia   ? External hemorrhoids   ? GERD (gastroesophageal reflux disease)   ? Heart murmur   ? mild-moderate AR  ? Hiatal hernia   ? Hyperlipidemia   ? on meds   ? Hypertension   ? Internal hemorrhoids   ? lung ca with brain mets 07/2020  ? Osteoarthritis   ? Pre-diabetes   ? PVD (peripheral vascular disease) ([place])   ? moderate carotid disease  ? RBBB   ? Smoker   ? Vocal cord polyps   ? ?Past Surgical History:   ?Past Surgical History:  ?Procedure Laterality Date  ? ABDOMINAL HYSTERECTOMY  1994  ? APPLICATION OF CRANIAL NAVIGATION N/A 08/24/2020  ? Procedure: APPLICATION OF CRANIAL NAVIGATION;  Surgeon: Judith Part, MD;  Location: [facility];  Service: Neurosurgery;  Laterality: N/A;  ? COLONOSCOPY    ? COLONOSCOPY W/ POLYPECTOMY  2009  ? CRANIOTOMY Left 08/24/2020  ? Procedure: Left Craniotomy for Tumor Resection with Brainlab;  Surgeon: Judith Part, MD;  Location: [facility];  Service: Neurosurgery;  Laterality: Left;  ? HEMORRHOID  SURGERY    ? INTERCOSTAL NERVE BLOCK Right 09/24/2020  ? Procedure: INTERCOSTAL NERVE BLOCK;  Surgeon: Melrose Nakayama, MD;  Location: [facility];  Service: Thoracic;  Laterality: Right;  ? JOINT REPLACEMENT    ? LYMPH NODE DISSECTION Right 09/24/2020  ? Procedure: LYMPH NODE DISSECTION;  Surgeon: Melrose Nakayama, MD;  Location: [facility];  Service: Thoracic;  Laterality: Right;  ? POLYPECTOMY    ? right total knee arthroplasty    ? Dr. Emeterio Reeve 06-04-18  ? SPINE SURGERY  08/16/2009  ? TOTAL KNEE ARTHROPLASTY Left 02/13/2014  ? Procedure: TOTAL KNEE ARTHROPLASTY;  Surgeon: Alta Corning, MD;  Location: [facility];  Service: Orthopedics;  Laterality: Left;  ? TOTAL KNEE ARTHROPLASTY Right 06/04/2018  ? Procedure: RIGHT TOTAL KNEE ARTHROPLASTY;  Surgeon: Dorna Leitz, MD;  [facility];  Service: Orthopedics;  Laterality: Right;  Adductor Block  ? TUBAL LIGATION    ? ?Social History:  ?Social History  ? ?Socioeconomic History  ? Marital status: Married  ?  Spouse name: Not on file  ? Number of children: 2  ? Years of education: Not on file  ? Highest education level: Not on file  ?Occupational History  ? Not on file  ?Tobacco Use  ? Smoking status: Former  ?  Packs/day: 0.25  ?  Years: 40.00  ?  Pack years: 10.00  ?  Types: Cigarettes  ?  Quit date: 07/09/2020  ?  Years since quitting: 1.2  ?  Passive exposure: Never  ? Smokeless tobacco: Never  ? Tobacco comments:  ?  PACK WILL LAST 3 days   ?Vaping Use  ? Vaping Use: Never used  ?Substance and Sexual Activity  ? Alcohol use: No  ? Drug use: No  ? Sexual activity: Not Currently  ?Other Topics Concern  ? Not on file  ?Social History Narrative  ? Not on fi

## 2021-10-02 ENCOUNTER — Ambulatory Visit (INDEPENDENT_AMBULATORY_CARE_PROVIDER_SITE_OTHER): Payer: Medicare HMO | Admitting: *Deleted

## 2021-10-02 ENCOUNTER — Other Ambulatory Visit (HOSPITAL_COMMUNITY): Payer: Self-pay | Admitting: Psychiatry

## 2021-10-02 DIAGNOSIS — C3491 Malignant neoplasm of unspecified part of right bronchus or lung: Secondary | ICD-10-CM

## 2021-10-02 DIAGNOSIS — I10 Essential (primary) hypertension: Secondary | ICD-10-CM

## 2021-10-02 NOTE — Patient Instructions (Addendum)
Visit Information ? ?[name], thank you for taking time to talk with me today about Hannah [name]'s healthcare needs. Please don't hesitate to contact me if I can be of assistance to you before our next scheduled telephone appointment. ? ?Below are the goals we discussed today:  ?Patient Self-Care Activities: ?Patient Hannah [name] will: ?Take medications as prescribed ?Attend all scheduled provider appointments ?Call pharmacy for medication refills ?Attend church or other social activities ?Call provider office for new concerns or questions ?Keep up the great work staying as active as possible and following a low-salt, heart healthy diet ?Continue to occasionally monitor and write down on paper your blood pressures from home ?Take precautions to prevent falls ? ?Our next scheduled telephone follow up visit/ appointment is scheduled on: Wednesday, January 01, 2022 at 9:45 am- This is a PHONE CALL appointment ? ?If you need to cancel or re-schedule our visit, please call [phone] and our care guide team will be happy to assist you. ?  ?I look forward to hearing about your progress. ?  ?[name], RN, BSN, CCRN Alumnus ?[place] ?([phone]: direct office ? ?If you are experiencing a Mental Health or [place] or need someone to talk to, please  ?call the Suicide and Crisis Lifeline: 988 ?call the [place] National Suicide Prevention Lifeline: 947 747 2200 or TTY: 516 733 7002 TTY 4401007493) to talk to a trained counselor ?call 1-800-273-TALK (toll free, 24 hour hotline) ?go to [facility] [address] [place] [phone]) ?call 911  ? ?Caregiver verbalizes understanding of instructions and care plan provided today and agrees to view in [place]. Active MyChart status confirmed with caregiver ? ?Living With Chronic Cancer ?Chronic cancer refers to cancers that are not cured with treatment but can be controlled for  months or years with treatment. The goal of treatment with chronic cancer is to help you maintain the best quality of life possible for as long as possible. The unknowns of living with chronic cancer can be challenging. Finding healthy ways to manage your emotions will help you get through difficult times. ?How to manage lifestyle changes ?Managing stress ?Finding healthy ways to take care of yourself can reduce stress and anxiety. This can make a difficult situation feel easier to handle. Try some of these suggestions: ?Do a relaxing activity that you enjoy, such as drawing, painting, listening to music, reading, or meditating. ?Stay social and active, if you feel up to it. ?Get regular exercise. Consider trying a type of gentle mind-body exercise, such as yoga or tai chi. ? ?Relationships ?Stay connected to the people in your life. You could schedule a regular day to spend time together, or you could stay in touch by phone or online. Other ways to stay connected include: ?Planning a social activity each week, depending on how you feel. ?Making an effort to tell others about the good parts of your day, as well as the difficult parts. ?Exchanging entries in a common journal together. It may be easier to communicate about difficult topics by writing them down instead of saying them out loud. ?Texting, emailing, or video chatting with your friends and family members when you are not able to spend time together in person. ?Setting up a social media site where you can share news and where others can get updates and send you messages. ?General instructions ? ?You may need to try different things until you find what works for you. You can start by: ?Letting the  people in your life know how you feel. ?Keeping a journal to help you sort through your thoughts and emotions. Sometimes writing them down can make you feel better. ?Joining a support group for people who are going through the same thing as you. You may make new  friends and get good information and support. ?It is normal to have many feelings during this time. If you have feelings of overwhelming sadness for longer than 2 weeks, let your health care provider know. You may benefit from visiting with a mental health professional. ?How to recognize stress ?Everyone is likely to feel stress while living with cancer. Be aware of the following symptoms of stress. These may include: ?Emotional symptoms, such as: ?Depression. ?Anxiety. ?Extreme anger. ?Relationship and behavior symptoms, such as: ?Isolating yourself. ?Fighting with others over small things. ?Avoiding important relationships. ?Follow these instructions at home: ?Lifestyle ?Do not drink alcohol. ?Do not use any products that contain nicotine or tobacco, such as cigarettes, e-cigarettes, and chewing tobacco. If you need help quitting, ask your health care provider. ?Do your best to get enough sleep. ?Limit naps to short periods throughout the day. ?Try to get in regular, gentle exercise each day. ?General instructions ? ?Drink enough fluid to keep your urine pale yellow. ?Eat plenty of healthy foods, such as lean meats, whole grains, fruits, and vegetables. Consider meeting with a dietitian to talk about what you should eat and drink. ?Take over-the-counter and prescription medicines only as told by your health care provider. ?Keep all follow-up visits as told by your health care provider. This is important. ?Where to find support ?Talking to others ?Decide how much you want to share with others about what you are going through. Explain that sometimes you might want to talk about what you are going through, and other times you might want to focus on other things besides cancer. It is important to know that you are not alone. Consider talking with: ?A mental health professional or therapist who specializes in dealing with cancer. ?Family members. ?Close friends. ?A member of your church, faith, or community  group. ? ?Therapy and support groups ?Consider joining a support group. If you can, make friends who are going through a similar experience. Someone who is able to really understand what you are going through can be a good source of support and help you stay positive. If there is not a group near you, consider an online support group. ?Ask for support and resources from your cancer care team and caregivers. ?Other ways to find support ?Consider asking a friend or caregiver to help with errands such as grocery shopping, cleaning, and driving you to your appointments. If you are able, consider paying for these services if you do not have people to help you. ?Contact your local home care agencies, community agencies, and social agencies for help. ?Ask to meet with a palliative care specialist for help treating your pain and other symptoms. Palliative care can help you manage symptoms, promote comfort, improve quality of life, and maintain dignity. Palliative care may be offered during any phase of a cancer diagnosis. ?Where to find more information ?American Cancer Society: www.cancer.org ?[place]: www.cancer.gov ?Contact a health care provider if you: ?Are feeling overwhelmed with your emotions. ?Recognize that your stress is getting to be too much to handle on your own. ?Are feeling depressed. ?Get help right away if you: ?Have pain that is not controlled well with medicine. ?Are thinking about hurting yourself or someone else. ?Are  feeling hopeless about treatment for the cancer, and you just want to give up. ?If you ever feel like you may hurt yourself or others, or have thoughts about taking your own life, get help right away. Go to your nearest emergency department or: ?Call your local emergency services (911 in the [place] ?Call a suicide crisis helpline, such as the [place] at 740-207-5805 or 988 in the [place]. This is open 24 hours a day in the [place] ?Text the Crisis  Text Line at 660-159-0058 (in the [place].). ?Summary ?Chronic cancer refers to cancers that are not cured with treatment but can be controlled for months or years with treatment. ?The unknowns of living with chronic cancer ca

## 2021-10-02 NOTE — Chronic Care Management (AMB) (Signed)
?Chronic Care Management  ? ?CCM RN Visit Note ? ?10/02/2021 ?Name: Hannah [name] MRN: [id number] DOB: [birth year] ? ?Subjective: ?Hannah [name] is a 71 y.o. year old female who is a primary care patient of Burns, Claudina Lick, MD. The care management team was consulted for assistance with disease management and care coordination needs.   ? ?Engaged with patient's caregiver/ daughter [name], on [facility] by telephone for follow up visit in response to provider referral for case management and/or care coordination services.  ? ?Consent to Services:  ?The patient was given information about Chronic Care Management services, agreed to services, and gave verbal consent prior to initiation of services.  Please see initial visit note for detailed documentation.  ?Patient agreed to services and verbal consent obtained.  ? ?Assessment: Review of patient past medical history, allergies, medications, health status, including review of consultants reports, laboratory and other test data, was performed as part of comprehensive evaluation and provision of chronic care management services.  ? ?SDOH (Social Determinants of Health) assessments and interventions performed:  ?SDOH Interventions   ? ?Flowsheet Row Most Recent Value  ?SDOH Interventions   ?Food Insecurity Interventions Intervention Not Indicated  [daughter/ caregiver continues to deny food insecurity]  ?Housing Interventions Intervention Not Indicated  ?Transportation Interventions Intervention Not Indicated  [daughter and/ or other family members continue to provide transportation]  ? ?  ? CCM Care Plan ? ?Allergies  ?Allergen Reactions  ? Amlodipine Swelling and Rash  ?  Rash, swelling  ? Chantix [Varenicline Tartrate] Shortness Of Breath, Swelling and Other (See Comments)  ?  Tongue swell,sob  ? Clarithromycin Rash  ? Lisinopril Hives  ? Simvastatin Hives  ? Wellbutrin [Bupropion Hcl] Hives  ? Lipitor [Atorvastatin]   ?  Dizziness per patient  ? Sertraline   ?  Makes her  feel like she is going to kill someone  ? ?Outpatient Encounter Medications as of 10/02/2021  ?Medication Sig Note  ? acetaminophen (TYLENOL) 500 MG tablet Take 2 tablets (1,000 mg total) by mouth every 6 (six) hours.   ? ALPRAZolam (XANAX XR) 0.5 MG 24 hr tablet Take one tablet (0.5 mg total) by mouth every night at bedtime.   ? ALPRAZolam (XANAX) 0.5 MG tablet Take one tablet (0.5 mg total) by mouth qam and may take one more tablet (0.5 mg total) qd prn for anxiety.   ? Ascorbic Acid (VITAMIN C) 1000 MG tablet Take 1,000 mg by mouth daily.   ? aspirin 81 MG tablet Take 1 tablet (81 mg total) by mouth daily. Restart on 08/31/20   ? bisacodyl (DULCOLAX) 5 MG EC tablet Take 5 mg by mouth daily as needed for moderate constipation.   ? carboxymethylcellulose (REFRESH PLUS) 0.5 % SOLN Place 1-2 drops into both eyes 3 (three) times daily as needed (dry eyes).   ? cephALEXin (KEFLEX) 500 MG capsule Take 1 capsule (500 mg total) by mouth 2 (two) times daily. 09/26/2021: Taking for UTI. ?Wilmon Arms, RN ?09/26/21 11:43 AM ? ? ? ?  ? Cholecalciferol (D3 PO) Take 1 capsule by mouth daily.   ? citalopram (CELEXA) 10 MG tablet Take 1 tablet (10 mg total) by mouth daily.   ? dexamethasone (DECADRON) 1 MG tablet Take 1 tablet (1 mg total) by mouth daily.   ? fluticasone (FLONASE) 50 MCG/ACT nasal spray PLACE 2 SPRAYS INTO BOTH NOSTRILS DAILY.   ? folic acid (FOLVITE) 1 MG tablet Take 1 tablet (1 mg total) by mouth daily.   ?  Lacosamide 100 MG TABS Take 1 tablet (100 mg total) by mouth 2 (two) times daily.   ? levETIRAcetam (KEPPRA) 100 MG/ML solution TAKE 15 ML BY MOUTH TWICE DAILY   ? loratadine (CLARITIN) 10 MG tablet Take 10 mg by mouth daily as needed for allergies.   ? Menthol, Topical Analgesic, (BIOFREEZE EX) Apply 1 application topically as needed (pain).   ? mirtazapine (REMERON) 45 MG tablet Take 1 tablet (45 mg total) by mouth at bedtime.   ? olopatadine (PATANOL) 0.1 % ophthalmic solution Place 1 drop into both eyes 2  (two) times daily as needed for allergies.   ? omeprazole (PRILOSEC) 40 MG capsule TAKE 1 CAPSULE TWICE DAILY BEFORE MEALS   ? polyethylene glycol (MIRALAX / GLYCOLAX) 17 g packet Take 17 g by mouth 2 (two) times daily.   ? pravastatin (PRAVACHOL) 40 MG tablet TAKE 1 TABLET (40 MG TOTAL) BY MOUTH EVERY EVENING.   ? Probiotic Product (PROBIOTIC DAILY PO) Take 1 capsule by mouth daily.   ? prochlorperazine (COMPAZINE) 10 MG tablet Take 1 tablet (10 mg total) by mouth every 6 (six) hours as needed for nausea or vomiting.   ? simethicone (MYLICON) 80 MG chewable tablet Chew 1 tablet (80 mg total) by mouth 4 (four) times daily as needed for flatulence (Bloating).   ? ?No facility-administered encounter medications on file as of 10/02/2021.  ? ?Patient Active Problem List  ? Diagnosis Date Noted  ? Metastatic lung cancer (metastasis from lung to other site) [facility]) 09/18/2021  ? Chest pain 08/30/2021  ? Urine frequency 08/30/2021  ? Vasogenic brain edema ([place]) 04/22/2021  ? Seizure ([place]) 04/22/2021  ? Nonrheumatic aortic valve insufficiency 04/18/2021  ? Sinusitis 02/21/2021  ? Allergic rhinitis 02/21/2021  ? Encounter for antineoplastic chemotherapy 10/23/2020  ? Encounter for antineoplastic immunotherapy 10/23/2020  ? Anemia 10/19/2020  ? Sleep difficulties 10/19/2020  ? S/P robot-assisted surgical procedure 09/24/2020  ? Aortic atherosclerosis ([place]) 09/12/2020  ? Status post craniotomy 08/24/2020  ? Hematochezia 08/19/2020  ? Rectal bleeding 08/17/2020  ? Primary cancer of right upper lobe of lung ([place]) 08/07/2020  ? Brain metastasis 08/06/2020  ? Memory changes 07/24/2020  ? Non-recurrent unilateral inguinal hernia without obstruction or gangrene 07/24/2020  ? Pain of right clavicle 09/12/2019  ? Nonspecific abnormal electrocardiogram (ECG) (EKG) 10/20/2018  ? Depression 07/17/2018  ? Primary osteoarthritis of right knee 06/04/2018  ? Bilateral carotid artery stenosis 09/24/2017  ? Aortic valve sclerosis 09/24/2017  ?  Vocal cord polyps 09/07/2017  ? Dysphagia 09/07/2017  ? Diabetes mellitus without complication ([place]) 76/73/4193  ? GERD (gastroesophageal reflux disease) 02/27/2016  ? Anxiety 02/27/2016  ? S/P total knee replacement 02/13/2014  ? RBBB 02/08/2014  ? PVD - bilateral 60-79% carotid strenosis 02/08/2014  ? Mixed hyperlipidemia 11/29/2013  ? Carpal tunnel syndrome, bilateral 11/23/2013  ? Murmur- mild -mod AR, mild MR 11/10/2013  ? Constipation 12/16/2012  ? ?Conditions to be addressed/monitored:  HTN and cancer ? ?Care Plan : RN Care Manager Plan of Care  ?Updates made by Knox Royalty, RN since 10/02/2021 12:00 AM  ?  ? ?Problem: Chronic Disease Management Needs   ?Priority: Medium  ?  ? ?Long-Range Goal: Ongoing adherence to established plan of care for long-term chronic disease management   ?Start Date: 12/27/2020  ?Expected End Date: 12/27/2020  ?Priority: Medium  ?Note:   ?Current Barriers:  ?Chronic Disease Management support and education needs related to HTN and Cancer ?Recent surgery x 2 for cancer treatment-  ongoing recuperation/ pain; recent metastases to brain  ?Recent hospitalization October 24-30, 2022 for cerebral edema: discharged home to self-care ?Recent ED visit 07/29/21 for chest discomfort- discharged home ?08/01/21: At least one new/ recent fall, without injury: caregiver reports patient does not require use of assistive devices ?Caregiver denies new/ recent falls since last outreach 08/01/21 ? ?RNCM Clinical Goal(s):  ?Patient will demonstrate ongoing health management independence for HTN/ cancer  through collaboration with RN Care manager, provider, and care team.  ? ?Interventions: ?1:1 collaboration with primary care provider regarding development and update of comprehensive plan of care as evidenced by provider attestation and co-signature ?Inter-disciplinary care team collaboration (see longitudinal plan of care) ?Evaluation of current treatment plan related to  self management and patient's  adherence to plan as established by provider\ ?Review of patient status, including review of consultants reports, relevant laboratory and other test results, and medications completed ?SDOH updated: no new/

## 2021-10-03 ENCOUNTER — Other Ambulatory Visit (HOSPITAL_COMMUNITY): Payer: Self-pay | Admitting: *Deleted

## 2021-10-03 MED ORDER — CITALOPRAM HYDROBROMIDE 10 MG PO TABS: 10.0000 mg | ORAL_TABLET | Freq: Every day | ORAL | 0 refills | Status: DC

## 2021-10-04 ENCOUNTER — Encounter: Payer: Self-pay | Admitting: Internal Medicine

## 2021-10-04 DIAGNOSIS — C7931 Secondary malignant neoplasm of brain: Secondary | ICD-10-CM | POA: Diagnosis not present

## 2021-10-07 ENCOUNTER — Telehealth: Payer: Self-pay | Admitting: Radiation Therapy

## 2021-10-07 ENCOUNTER — Other Ambulatory Visit: Payer: Self-pay

## 2021-10-07 ENCOUNTER — Other Ambulatory Visit: Payer: Self-pay | Admitting: Radiation Therapy

## 2021-10-07 NOTE — Telephone Encounter (Signed)
Called Hannah [name] to see how the visit with Dr. Brett [name] on Friday 4/7 went, I spoke with the patient's daughter, Hannah [name]. Hannah [name], said the visit went well and they are planning to proceed with the LITT procedure on Monday 4/17. I will add Hannah [name] to the brain and spine conference list for Monday 4/24 to review post op imaging and path results. ? ?While we were on the phone, her daughter asked about the restaging scans and visit with Dr. Julien [name] scheduled next week. She is requesting that these visits be pushed out one week due to her mother's surgery on 4/17. I have sent an in-basket message to Dr. Worthy [name] scheduler, Hannah [name] based on this request.  ? ? ?Hannah [name] R.T.(R)(T) ?Radiation Special Procedures Navigator  ?

## 2021-10-11 ENCOUNTER — Telehealth: Payer: Self-pay | Admitting: Internal Medicine

## 2021-10-11 ENCOUNTER — Other Ambulatory Visit: Payer: Self-pay | Admitting: Internal Medicine

## 2021-10-11 DIAGNOSIS — C3411 Malignant neoplasm of upper lobe, right bronchus or lung: Secondary | ICD-10-CM | POA: Diagnosis not present

## 2021-10-11 DIAGNOSIS — F329 Major depressive disorder, single episode, unspecified: Secondary | ICD-10-CM | POA: Diagnosis not present

## 2021-10-11 DIAGNOSIS — I358 Other nonrheumatic aortic valve disorders: Secondary | ICD-10-CM | POA: Diagnosis not present

## 2021-10-11 DIAGNOSIS — K219 Gastro-esophageal reflux disease without esophagitis: Secondary | ICD-10-CM | POA: Diagnosis not present

## 2021-10-11 DIAGNOSIS — Z01818 Encounter for other preprocedural examination: Secondary | ICD-10-CM | POA: Diagnosis not present

## 2021-10-11 DIAGNOSIS — C713 Malignant neoplasm of parietal lobe: Secondary | ICD-10-CM | POA: Diagnosis not present

## 2021-10-11 DIAGNOSIS — I739 Peripheral vascular disease, unspecified: Secondary | ICD-10-CM | POA: Diagnosis not present

## 2021-10-11 DIAGNOSIS — I10 Essential (primary) hypertension: Secondary | ICD-10-CM | POA: Diagnosis not present

## 2021-10-11 DIAGNOSIS — D649 Anemia, unspecified: Secondary | ICD-10-CM | POA: Diagnosis not present

## 2021-10-11 DIAGNOSIS — R011 Cardiac murmur, unspecified: Secondary | ICD-10-CM | POA: Diagnosis not present

## 2021-10-11 DIAGNOSIS — Z96659 Presence of unspecified artificial knee joint: Secondary | ICD-10-CM | POA: Diagnosis not present

## 2021-10-11 DIAGNOSIS — G40909 Epilepsy, unspecified, not intractable, without status epilepticus: Secondary | ICD-10-CM | POA: Diagnosis not present

## 2021-10-11 DIAGNOSIS — F419 Anxiety disorder, unspecified: Secondary | ICD-10-CM | POA: Diagnosis not present

## 2021-10-11 DIAGNOSIS — I700 Atherosclerosis of aorta: Secondary | ICD-10-CM | POA: Diagnosis not present

## 2021-10-11 DIAGNOSIS — E119 Type 2 diabetes mellitus without complications: Secondary | ICD-10-CM | POA: Diagnosis not present

## 2021-10-11 DIAGNOSIS — D496 Neoplasm of unspecified behavior of brain: Secondary | ICD-10-CM | POA: Diagnosis not present

## 2021-10-11 DIAGNOSIS — I4510 Unspecified right bundle-branch block: Secondary | ICD-10-CM | POA: Diagnosis not present

## 2021-10-11 DIAGNOSIS — C7931 Secondary malignant neoplasm of brain: Secondary | ICD-10-CM | POA: Diagnosis not present

## 2021-10-11 DIAGNOSIS — E1151 Type 2 diabetes mellitus with diabetic peripheral angiopathy without gangrene: Secondary | ICD-10-CM | POA: Diagnosis not present

## 2021-10-11 NOTE — Telephone Encounter (Signed)
.  Called patient to schedule appointment per 4/11 inbasket, patient is aware of date and time.   ?

## 2021-10-15 ENCOUNTER — Ambulatory Visit
Admission: RE | Admit: 2021-10-15 | Discharge: 2021-10-15 | Disposition: A | Discharge status: Home / Self Care | Payer: Self-pay | Source: Ambulatory Visit | Attending: Internal Medicine | Admitting: Internal Medicine

## 2021-10-15 ENCOUNTER — Other Ambulatory Visit: Payer: Medicare HMO

## 2021-10-15 ENCOUNTER — Other Ambulatory Visit: Payer: Self-pay | Admitting: Radiation Therapy

## 2021-10-15 DIAGNOSIS — C7949 Secondary malignant neoplasm of other parts of nervous system: Secondary | ICD-10-CM

## 2021-10-16 ENCOUNTER — Ambulatory Visit
Admission: RE | Admit: 2021-10-16 | Discharge: 2021-10-16 | Disposition: A | Discharge status: Home / Self Care | Payer: Self-pay | Source: Ambulatory Visit | Attending: Internal Medicine | Admitting: Internal Medicine

## 2021-10-16 ENCOUNTER — Other Ambulatory Visit: Payer: Self-pay | Admitting: Internal Medicine

## 2021-10-16 ENCOUNTER — Ambulatory Visit: Payer: Medicare HMO | Admitting: Internal Medicine

## 2021-10-16 ENCOUNTER — Ambulatory Visit: Payer: Medicare HMO

## 2021-10-16 ENCOUNTER — Ambulatory Visit: Payer: Medicare HMO | Admitting: Physician Assistant

## 2021-10-16 ENCOUNTER — Other Ambulatory Visit: Payer: Medicare HMO

## 2021-10-16 DIAGNOSIS — C3411 Malignant neoplasm of upper lobe, right bronchus or lung: Secondary | ICD-10-CM

## 2021-10-17 ENCOUNTER — Other Ambulatory Visit: Payer: Medicare HMO

## 2021-10-17 ENCOUNTER — Ambulatory Visit: Payer: Medicare HMO | Admitting: Internal Medicine

## 2021-10-17 ENCOUNTER — Ambulatory Visit: Payer: Medicare HMO

## 2021-10-18 ENCOUNTER — Other Ambulatory Visit (HOSPITAL_COMMUNITY): Payer: Self-pay | Admitting: Psychiatry

## 2021-10-18 ENCOUNTER — Other Ambulatory Visit: Payer: Self-pay | Admitting: Internal Medicine

## 2021-10-21 ENCOUNTER — Encounter: Payer: Self-pay | Admitting: Internal Medicine

## 2021-10-21 ENCOUNTER — Inpatient Hospital Stay: Payer: Medicare HMO | Attending: Internal Medicine

## 2021-10-21 ENCOUNTER — Telehealth (HOSPITAL_COMMUNITY): Payer: Medicare HMO | Admitting: Psychiatry

## 2021-10-21 DIAGNOSIS — I700 Atherosclerosis of aorta: Secondary | ICD-10-CM | POA: Insufficient documentation

## 2021-10-21 DIAGNOSIS — R21 Rash and other nonspecific skin eruption: Secondary | ICD-10-CM | POA: Insufficient documentation

## 2021-10-21 DIAGNOSIS — C3411 Malignant neoplasm of upper lobe, right bronchus or lung: Secondary | ICD-10-CM | POA: Insufficient documentation

## 2021-10-21 DIAGNOSIS — C7931 Secondary malignant neoplasm of brain: Secondary | ICD-10-CM | POA: Insufficient documentation

## 2021-10-21 DIAGNOSIS — M47814 Spondylosis without myelopathy or radiculopathy, thoracic region: Secondary | ICD-10-CM | POA: Insufficient documentation

## 2021-10-21 DIAGNOSIS — R5383 Other fatigue: Secondary | ICD-10-CM | POA: Insufficient documentation

## 2021-10-21 DIAGNOSIS — Z87891 Personal history of nicotine dependence: Secondary | ICD-10-CM | POA: Insufficient documentation

## 2021-10-21 DIAGNOSIS — Z902 Acquired absence of lung [part of]: Secondary | ICD-10-CM | POA: Insufficient documentation

## 2021-10-21 DIAGNOSIS — E785 Hyperlipidemia, unspecified: Secondary | ICD-10-CM | POA: Insufficient documentation

## 2021-10-21 DIAGNOSIS — I739 Peripheral vascular disease, unspecified: Secondary | ICD-10-CM | POA: Insufficient documentation

## 2021-10-21 DIAGNOSIS — Z5112 Encounter for antineoplastic immunotherapy: Secondary | ICD-10-CM | POA: Insufficient documentation

## 2021-10-21 DIAGNOSIS — Z79899 Other long term (current) drug therapy: Secondary | ICD-10-CM | POA: Insufficient documentation

## 2021-10-21 DIAGNOSIS — Z888 Allergy status to other drugs, medicaments and biological substances status: Secondary | ICD-10-CM | POA: Insufficient documentation

## 2021-10-21 DIAGNOSIS — Z5111 Encounter for antineoplastic chemotherapy: Secondary | ICD-10-CM | POA: Insufficient documentation

## 2021-10-21 DIAGNOSIS — K219 Gastro-esophageal reflux disease without esophagitis: Secondary | ICD-10-CM | POA: Insufficient documentation

## 2021-10-21 DIAGNOSIS — F419 Anxiety disorder, unspecified: Secondary | ICD-10-CM | POA: Insufficient documentation

## 2021-10-21 DIAGNOSIS — K8020 Calculus of gallbladder without cholecystitis without obstruction: Secondary | ICD-10-CM | POA: Insufficient documentation

## 2021-10-21 DIAGNOSIS — Z8719 Personal history of other diseases of the digestive system: Secondary | ICD-10-CM | POA: Insufficient documentation

## 2021-10-21 DIAGNOSIS — R531 Weakness: Secondary | ICD-10-CM | POA: Insufficient documentation

## 2021-10-21 DIAGNOSIS — F32A Depression, unspecified: Secondary | ICD-10-CM | POA: Insufficient documentation

## 2021-10-21 DIAGNOSIS — J984 Other disorders of lung: Secondary | ICD-10-CM | POA: Insufficient documentation

## 2021-10-21 MED ORDER — CITALOPRAM HYDROBROMIDE 10 MG PO TABS: 10.0000 mg | ORAL_TABLET | Freq: Every day | ORAL | 1 refills | Status: DC

## 2021-10-22 ENCOUNTER — Ambulatory Visit
Admission: RE | Admit: 2021-10-22 | Discharge: 2021-10-22 | Disposition: A | Discharge status: Home / Self Care | Payer: Medicare HMO | Source: Ambulatory Visit | Attending: Physician Assistant | Admitting: Physician Assistant

## 2021-10-22 ENCOUNTER — Other Ambulatory Visit: Payer: Self-pay | Admitting: Radiation Therapy

## 2021-10-22 ENCOUNTER — Telehealth (HOSPITAL_COMMUNITY): Payer: Medicare HMO | Admitting: Psychiatry

## 2021-10-22 ENCOUNTER — Encounter: Payer: Self-pay | Admitting: Urology

## 2021-10-22 ENCOUNTER — Telehealth: Payer: Self-pay | Admitting: Radiation Therapy

## 2021-10-22 DIAGNOSIS — C7931 Secondary malignant neoplasm of brain: Secondary | ICD-10-CM

## 2021-10-22 DIAGNOSIS — I2510 Atherosclerotic heart disease of native coronary artery without angina pectoris: Secondary | ICD-10-CM | POA: Diagnosis not present

## 2021-10-22 DIAGNOSIS — K8020 Calculus of gallbladder without cholecystitis without obstruction: Secondary | ICD-10-CM | POA: Diagnosis not present

## 2021-10-22 DIAGNOSIS — D179 Benign lipomatous neoplasm, unspecified: Secondary | ICD-10-CM | POA: Diagnosis not present

## 2021-10-22 DIAGNOSIS — M160 Bilateral primary osteoarthritis of hip: Secondary | ICD-10-CM | POA: Diagnosis not present

## 2021-10-22 DIAGNOSIS — M47814 Spondylosis without myelopathy or radiculopathy, thoracic region: Secondary | ICD-10-CM | POA: Diagnosis not present

## 2021-10-22 DIAGNOSIS — C3411 Malignant neoplasm of upper lobe, right bronchus or lung: Secondary | ICD-10-CM

## 2021-10-22 DIAGNOSIS — C3490 Malignant neoplasm of unspecified part of unspecified bronchus or lung: Secondary | ICD-10-CM | POA: Diagnosis not present

## 2021-10-22 DIAGNOSIS — J984 Other disorders of lung: Secondary | ICD-10-CM | POA: Diagnosis not present

## 2021-10-22 MED ORDER — IOPAMIDOL (ISOVUE-300) INJECTION 61%
100.0000 mL | Freq: Once | INTRAVENOUS | Status: AC | PRN
  Administered 2021-10-22: 100 mL via INTRAVENOUS

## 2021-10-22 NOTE — Progress Notes (Signed)
Telephone appointment. I spoke w/ patient's daughter Ms. Hannah [name], verified her identity and began nursing interview. She reports that patient Hannah [name] is doing okay. No care related issues reported at this time. ? ?Meaningful use complete. ?Hysterectomy- NO chances of pregnancy. ? ?Reminded patient's daughter of her 8:30am-10/23/21 telephone appointment w/ Ashlyn Bruning PA-C. I left my extension [phone] in case patient needs anything. ? ?Patient's daughter Hannah [name] verbalized understanding of information. ? ?Patient contact [phone] -[name] Winona [place] ?

## 2021-10-22 NOTE — Telephone Encounter (Signed)
I called Mrs. [place] daughter about the recommendation to treat her LITT surgical cavity postoperatively with five fractions of SRT. She and the patient had already discussed the pathology results with Dr. Brett Albino, and were aware that there were malignant cells in that sample. [name] is understanding of and agreeable to the plans to treat this area.  ? ?Ashlyn will also call them for a formal follow-up tomorrow morning, 4/26, to offer a time for them to ask questions and to sign the consent.  ? ?[name] R.T.(R)(T) ?Radiation Special Procedures Navigator  ?

## 2021-10-23 ENCOUNTER — Telehealth: Payer: Self-pay | Admitting: Radiation Therapy

## 2021-10-23 ENCOUNTER — Ambulatory Visit
Admission: RE | Admit: 2021-10-23 | Discharge: 2021-10-23 | Disposition: A | Discharge status: Home / Self Care | Payer: Medicare HMO | Source: Ambulatory Visit | Attending: Urology | Admitting: Urology

## 2021-10-23 DIAGNOSIS — C7931 Secondary malignant neoplasm of brain: Secondary | ICD-10-CM | POA: Diagnosis not present

## 2021-10-23 DIAGNOSIS — C3490 Malignant neoplasm of unspecified part of unspecified bronchus or lung: Secondary | ICD-10-CM

## 2021-10-23 NOTE — Progress Notes (Signed)
?Radiation Oncology         [phone]) [phone] ?________________________________ ? ?Name: Hannah [name] MRN: [id number]  ?Date: 10/23/2021  DOB: 03/09/1951 ? ?Post Treatment Note ? ?CC: Burns, Hannah Lick, MD  [name], MD ? ?Diagnosis:   71 yo woman with brain metastasis from Stage IV non-small cell lung cancer (T3, N0, M1C) adenocarcinoma of the right upper lung.  ? ?Interval Since Last Radiation:  11 months ?11/29/20//salvage SRS: The following brain targets were treated to a prescription dose of 20 Gy in a single fraction: ? ? ? ?08/23/20//pre-op SRS:  PTV1: The Left frontal 2.5 cm target was treated to a prescription dose of 18 Gy in a single fraction (Pre-op).  ? ?Narrative:  I spoke with the patient and her daughter, Hannah [name], to conduct her routine scheduled follow up visit and review her recent pathology results via telephone to spare the patient unnecessary potential exposure in the healthcare setting during the current COVID-19 pandemic.    ? ?In brief summary, she initially presented to her PCP in late Jan. 2022 with complaints of several days of rapidly progressive speech impairment and right sided weakness. She and her family described difficulty putting complete sentences together, impaired use of the right arm, and dragging of the right leg while walking.  Her PCP, Dr. Quay Burow ordered imaging, initially with a noncontrast brain MRI performed on 07/31/2020 which showed a 1.5 cm left frontal lobe lesion with prominent surrounding edema and mass-effect with a 3 mm rightward midline shift. ?  ?Brain MRI with contrast on 08/02/20 demonstrated a lobulated left frontal lesion with necrotic center and irregular peripheral contrast enhancement measuring approximately 2.2 x 1.8 x 1.4 cm, with surrounding edema and 3 to 4 mm rightward midline shift.  No additional brain lesions were noted. ?  ?She was started on Decadron at $RemoveBef'4mg'GUyQMfztfe$  twice per day and did notice improvement with regards to the right sided weakness and speech. She  met with Dr. Mickeal Skinner on 08/06/20 and the Decadron dose was lowered to $RemoveBe'4mg'zJXkBEKAX$  daily, starting on 08/08/20, which she tolerated well. ?  ?A CT C/A/P was obtained on 08/02/20 to assess for a potential primary source of the apparent brain metastasis since she had no prior history of cancer to her knowledge.  The chest imaging revealed an irregular, solid 5.4 x 4.4 cm peripheral right upper lobe lung mass, consistent with bronchogenic carcinoma.  There were no enlarged mediastinal nodes or other evidence of metastatic cancer in the chest, abdomen or pelvis.  A PET scan on 08/08/20 confirmed the hypermetabolic mass in the right upper lobe but no other sites of active disease.    ? ?We met with the patient and her daughter, Hannah [name], on 08/09/20 to discuss radiation options for management of the brain disease. Her case was discussed in multidisciplinary brain conference and the consensus recommendation was to proceed with pre-op SRS followed by surgical resection which she was in agreement with. She had a single fraction of SRS to the solitary met in the left frontal lobe on 08/23/20 which was tolerated well and then proceeded to surgical resection, under the care of Dr. Zada Finders on 08/24/20.  She did well with surgery and was able to go home the following day. Final surgical pathology showed a poorly differentiated carcinoma consistent with a lung primary. ? ?She met with Dr. Roxan Hockey on 09/04/20 to discuss robotic assisted thorascopy-right upper lobectomy with en bloc wedge resection of the right middle lobe and node resection which was performed  on 09/24/20. 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  She met back with Dr. Julien Nordmann on 10/23/20 and the recommendation was to proceed with adjuvant systemic chemotherapy with combination chemo-immunotherapy which she started 11/07/20 with carboplatin, Alimta and Keytruda 200 mg IV  every 3 weeks. Starting from cycle #5, the carboplatin was discontinued and she continued on maintenance therapy with Alimta and Keytruda.       ? ?On her follow up/surveillance MRI brain on 11/16/20 there were two new subcentimeter metastases in the bilateral cerebrum with a 54mm right low parietal lesion and a 4 mm left parietal lesion. Evolving left frontal treatment site without findings worrisome for residual disease.  These images were reviewed in the multidisciplinary brain conference on 11/19/20 and consensus recommendation was to proceed with a single fraction of SRS to treat the two new lesions. She and her daughter, Hannah [name] were in agreement to proceed and the [place] treatment was performed on 11/29/20 and tolerated very well.  ? ?Interval history:  She has continued in surveillance with serial MRI brain scans under the care of Dr. Mickeal Skinner and without any concerning findings for disease progression or recurrence until her scan in 06/2021 showed some progressed enhancement and vasogenic edema at the previously treated left parietal metastasis site. This was reviewed in the mutlidisciplinary brain tumor conference and the consensus opinion was that this was most likely treatment effect/pseudoprogression versus true progression of disease. She had a repeat MRI brain scan on 09/20/21 as recommended and this showed significant progressive enhancement and edema at the treated left parietal metastasis with the enhancing area measuring up to 2 cm in size on this exam. She was not having any progressive neurologic symptoms fortunately.  After reviewing her case in the brain tumor board, the recommendation was for consideration of LITT versus surgical resection of the previously treated left parietal lesion and the patient preferred to pursue evaluation for LITT so she was referred to Dr. Brett Albino at [facility].  She met with Dr. Brett Albino in consultation on 10/04/2021 who recommended stereotactic biopsy with LITT.  This procedure was  performed on 10/14/2021 and final pathology did return confirming residual metastatic carcinoma, consistent with the patient's known lung carcinoma.  ? ?Her case and recent pathology results were again reviewed in the multidisciplinary brain tumor conference on Monday, 10/21/2021 with consensus recommendation for postoperative stereotactic radiotherapy to the LITT surgical cavity.  At today's visit, we reviewed her recent treatment and pathology as well as the recommendation for a 5 fraction course of stereotactic radiotherapy (SRS) to the left parietal LITT surgical cavity. ? ?On review of systems, the patient states that she is doing well in general.  She and her daughter feel like she tolerated her recent LITT procedure well and has recovered well from the procedure.  She has recently completely tapered off dexamethasone, at the instruction of Dr. Brett Albino following her procedure.  She has continued with occasional, intermittent headaches that have not worsened and are mild and resolve with Tylenol/Advil prn. She denies any changes in visual or auditory acuity, progressive focal weakness, paraesthesias, difficulty with speech, imbalance, tremor or seizure activity. She denies any fever, chills, night sweats, or weight loss. She denies any increased shortness of breath, cough, or chest pain. She sometimes has chest discomfort over her right ribs at the site of her prior surgery but this is mild and not progressive. She she denies any  nausea, vomiting, diarrhea, or constipation and she is maintaining her weight. Her energy is improving and overall, she is pleased with her progress. ? ?ALLERGIES:  is allergic to amlodipine, chantix [varenicline tartrate], clarithromycin, lisinopril, simvastatin, wellbutrin [bupropion hcl], lipitor [atorvastatin], and sertraline. ? ?Meds: ?Current Outpatient Medications  ?Medication Sig Dispense Refill  ? acetaminophen (TYLENOL) 500 MG tablet Take 2 tablets (1,000 mg total) by mouth every  6 (six) hours. 30 tablet 0  ? ALPRAZolam (XANAX XR) 0.5 MG 24 hr tablet Take one tablet (0.5 mg total) by mouth every night at bedtime. 30 tablet 5  ? ALPRAZolam (XANAX) 0.5 MG tablet Take one tablet (0.5

## 2021-10-23 NOTE — Telephone Encounter (Signed)
Mrs. Ishikawa and her daughter, [name], had a telehealth visit with Ashlyn this morning.  ?They are on board for the recommended post LITT SRS course to be delivered in 5 fractions. [name] had a question about her mother's steroids. She had been taking 1mg  daily prior to the LITT, and Dr. Brett Albino has tapered her off, she completed this taper yesterday. She asked if Dr. Mickeal Skinner would like her to resume the low dose steroids, 1mg  daily, until she completes the [facility] course? ? ?Per Dr. Mickeal Skinner, he would like to avoid steroids if possible. I called to let [name] know and instructed her to call back if she notices any new CNS symptoms or changes in her mother's behavior now that the taper is complete. [name] expressed understanding and was thankful for the return call.  ? ?[name] R.T.(R)(T) ?Radiation Special Procedures Navigator  ?

## 2021-10-24 ENCOUNTER — Encounter: Payer: Self-pay | Admitting: Internal Medicine

## 2021-10-24 ENCOUNTER — Other Ambulatory Visit: Payer: Self-pay

## 2021-10-24 ENCOUNTER — Inpatient Hospital Stay: Payer: Medicare HMO

## 2021-10-24 ENCOUNTER — Inpatient Hospital Stay: Payer: Medicare HMO | Admitting: Internal Medicine

## 2021-10-24 VITALS — BP 145/77 | HR 74 | Temp 97.8°F | Wt 138.2 lb

## 2021-10-24 DIAGNOSIS — Z5112 Encounter for antineoplastic immunotherapy: Secondary | ICD-10-CM

## 2021-10-24 DIAGNOSIS — J984 Other disorders of lung: Secondary | ICD-10-CM | POA: Diagnosis not present

## 2021-10-24 DIAGNOSIS — I739 Peripheral vascular disease, unspecified: Secondary | ICD-10-CM | POA: Diagnosis not present

## 2021-10-24 DIAGNOSIS — C7931 Secondary malignant neoplasm of brain: Secondary | ICD-10-CM

## 2021-10-24 DIAGNOSIS — I700 Atherosclerosis of aorta: Secondary | ICD-10-CM | POA: Diagnosis not present

## 2021-10-24 DIAGNOSIS — C3490 Malignant neoplasm of unspecified part of unspecified bronchus or lung: Secondary | ICD-10-CM

## 2021-10-24 DIAGNOSIS — Z5111 Encounter for antineoplastic chemotherapy: Secondary | ICD-10-CM

## 2021-10-24 DIAGNOSIS — Z902 Acquired absence of lung [part of]: Secondary | ICD-10-CM | POA: Diagnosis not present

## 2021-10-24 DIAGNOSIS — M47814 Spondylosis without myelopathy or radiculopathy, thoracic region: Secondary | ICD-10-CM | POA: Diagnosis not present

## 2021-10-24 DIAGNOSIS — C3411 Malignant neoplasm of upper lobe, right bronchus or lung: Secondary | ICD-10-CM

## 2021-10-24 DIAGNOSIS — F419 Anxiety disorder, unspecified: Secondary | ICD-10-CM | POA: Diagnosis not present

## 2021-10-24 DIAGNOSIS — Z888 Allergy status to other drugs, medicaments and biological substances status: Secondary | ICD-10-CM | POA: Diagnosis not present

## 2021-10-24 DIAGNOSIS — K219 Gastro-esophageal reflux disease without esophagitis: Secondary | ICD-10-CM | POA: Diagnosis not present

## 2021-10-24 DIAGNOSIS — F32A Depression, unspecified: Secondary | ICD-10-CM | POA: Diagnosis not present

## 2021-10-24 DIAGNOSIS — K8020 Calculus of gallbladder without cholecystitis without obstruction: Secondary | ICD-10-CM | POA: Diagnosis not present

## 2021-10-24 DIAGNOSIS — Z87891 Personal history of nicotine dependence: Secondary | ICD-10-CM | POA: Diagnosis not present

## 2021-10-24 DIAGNOSIS — Z8719 Personal history of other diseases of the digestive system: Secondary | ICD-10-CM | POA: Diagnosis not present

## 2021-10-24 DIAGNOSIS — R5383 Other fatigue: Secondary | ICD-10-CM | POA: Diagnosis not present

## 2021-10-24 DIAGNOSIS — Z79899 Other long term (current) drug therapy: Secondary | ICD-10-CM | POA: Diagnosis not present

## 2021-10-24 DIAGNOSIS — R531 Weakness: Secondary | ICD-10-CM | POA: Diagnosis not present

## 2021-10-24 DIAGNOSIS — R21 Rash and other nonspecific skin eruption: Secondary | ICD-10-CM | POA: Diagnosis not present

## 2021-10-24 DIAGNOSIS — E785 Hyperlipidemia, unspecified: Secondary | ICD-10-CM | POA: Diagnosis not present

## 2021-10-24 LAB — CMP (CANCER CENTER ONLY)
ALT: 31 U/L (ref 0–44)
AST: 21 U/L (ref 15–41)
Albumin: 3.9 g/dL (ref 3.5–5.0)
Alkaline Phosphatase: 60 U/L (ref 38–126)
Anion gap: 8 (ref 5–15)
BUN: 18 mg/dL (ref 8–23)
CO2: 30 mmol/L (ref 22–32)
Calcium: 9.4 mg/dL (ref 8.9–10.3)
Chloride: 100 mmol/L (ref 98–111)
Creatinine: 0.96 mg/dL (ref 0.44–1.00)
GFR, Estimated: >60 mL/min (ref >60)
Glucose, Bld: 164 mg/dL — ABNORMAL HIGH (ref 70–99)
Potassium: 3.9 mmol/L (ref 3.5–5.1)
Sodium: 138 mmol/L (ref 135–145)
Total Bilirubin: 0.2 mg/dL — ABNORMAL LOW (ref 0.3–1.2)
Total Protein: 6.7 g/dL (ref 6.5–8.1)

## 2021-10-24 LAB — CBC WITH DIFFERENTIAL (CANCER CENTER ONLY)
Abs Immature Granulocytes: 0.31 10*3/uL — ABNORMAL HIGH (ref 0.00–0.07)
Basophils Absolute: 0 10*3/uL (ref 0.0–0.1)
Basophils Relative: 0 %
Eosinophils Absolute: 0 10*3/uL (ref 0.0–0.5)
Eosinophils Relative: 0 %
HCT: 40.1 % (ref 36.0–46.0)
Hemoglobin: 12.4 g/dL (ref 12.0–15.0)
Immature Granulocytes: 4 %
Lymphocytes Relative: 38 %
Lymphs Abs: 3.1 10*3/uL (ref 0.7–4.0)
MCH: 28.5 pg (ref 26.0–34.0)
MCHC: 30.9 g/dL (ref 30.0–36.0)
MCV: 92.2 fL (ref 80.0–100.0)
Monocytes Absolute: 0.8 10*3/uL (ref 0.1–1.0)
Monocytes Relative: 10 %
Neutro Abs: 3.9 10*3/uL (ref 1.7–7.7)
Neutrophils Relative %: 48 %
Platelet Count: 215 10*3/uL (ref 150–400)
RBC: 4.35 MIL/uL (ref 3.87–5.11)
RDW: 13.2 % (ref 11.5–15.5)
WBC Count: 8.1 10*3/uL (ref 4.0–10.5)
nRBC: 0 % (ref 0.0–0.2)

## 2021-10-24 LAB — TSH: TSH: 2.088 u[IU]/mL (ref 0.350–4.500)

## 2021-10-24 MED ORDER — PROCHLORPERAZINE MALEATE 10 MG PO TABS
10.0000 mg | ORAL_TABLET | Freq: Once | ORAL | Status: AC
  Administered 2021-10-24: 10 mg via ORAL
  Filled 2021-10-24: qty 1

## 2021-10-24 MED ORDER — SODIUM CHLORIDE 0.9 % IV SOLN
200.0000 mg | Freq: Once | INTRAVENOUS | Status: AC
  Administered 2021-10-24: 200 mg via INTRAVENOUS
  Filled 2021-10-24: qty 200

## 2021-10-24 MED ORDER — SODIUM CHLORIDE 0.9 % IV SOLN
800.0000 mg | Freq: Once | INTRAVENOUS | Status: AC
  Administered 2021-10-24: 800 mg via INTRAVENOUS
  Filled 2021-10-24: qty 20

## 2021-10-24 MED ORDER — SODIUM CHLORIDE 0.9 % IV SOLN: Freq: Once | INTRAVENOUS | Status: AC

## 2021-10-24 NOTE — Progress Notes (Signed)
?    [facility] ?[phone]   [phone] ? ?OFFICE PROGRESS NOTE ? ?[name], MD ?[place] [address] ? ?DIAGNOSIS: Stage IV non-small cell lung cancer (T, N0, M1C) adenocarcinoma.  The patient presented with a and solitary brain metastasis.  The patient was diagnosed in February 2022. ? ?Biomarker Findings ?Microsatellite status - MS-Stable ?Tumor Mutational Burden - 8 Muts/Mb ?Genomic Findings ?For a complete list of the genes assayed, please refer to the Appendix. ?KRAS G12V ?KEAP1 S224F ?TP53 P151T ?7 Disease relevant genes with no reportable ?alterations: ALK, BRAF, EGFR, ERBB2, MET, RET, ROS1 ? ?PDL1 Expression: 50% ? ?  ?PRIOR THERAPY: 1) SRS to the metastatic brain lesion on 08/23/2020 under the care of Dr. Tammi Klippel and craniotomy under the care of Dr. Zada Finders scheduled for 08/24/2020. ?2) S/p robotic assisted right upper lobectomy with en bloc wedge resection of the right middle lobe and lymph node dissection under the care of Dr. Roxan Hockey on September 24, 2020. ?3) status post SRS to a new subcentimeter brain lesions under the care of Dr. Lisbeth Renshaw. ?4) SRS to brain metastasis. ?  ?CURRENT THERAPY: Systemic chemotherapy with carboplatin for AUC of 5, Alimta 500 Mg/M2 and Keytruda 200 mg IV every 3 weeks.  First dose Nov 07, 2020.  Status post 15 cycles.  Starting from cycle #5 the patient will be on maintenance treatment with Alimta and Keytruda every 3 weeks. ? ? ?INTERVAL HISTORY: ?Hannah [name] 71 y.o. female returns to the clinic today for follow-up visit accompanied by her daughter.  The patient is feeling fine today with no concerning complaints.  She has no nausea, vomiting, diarrhea or constipation.  She has no headache or visual changes.  She has occasional rash on the left breast after treatment.  She denied having any fever or chills.  She has no chest pain, shortness of breath, cough or hemoptysis.  The patient continues to tolerate  her treatment with Alimta and Keytruda fairly well.  She is here today for evaluation with repeat CT scan of the chest, abdomen and pelvis for restaging of her disease. ? ?MEDICAL HISTORY: ?Past Medical History:  ?Diagnosis Date  ? Allergy   ? Anemia   ? Anxiety   ? Arthritis   ? Carpal tunnel syndrome   ? Constipation   ? senna C stool softeners help   ? Depression   ? Diverticulosis   ? Dyslipidemia   ? External hemorrhoids   ? GERD (gastroesophageal reflux disease)   ? Heart murmur   ? mild-moderate AR  ? Hiatal hernia   ? Hyperlipidemia   ? on meds   ? Hypertension   ? Internal hemorrhoids   ? lung ca with brain mets 07/2020  ? Osteoarthritis   ? Pre-diabetes   ? PVD (peripheral vascular disease) ([place])   ? moderate carotid disease  ? RBBB   ? Smoker   ? Vocal cord polyps   ? ? ?ALLERGIES:  is allergic to amlodipine, chantix [varenicline tartrate], clarithromycin, lisinopril, simvastatin, wellbutrin [bupropion hcl], lipitor [atorvastatin], and sertraline. ? ?MEDICATIONS:  ?Current Outpatient Medications  ?Medication Sig Dispense Refill  ? acetaminophen (TYLENOL) 500 MG tablet Take 2 tablets (1,000 mg total) by mouth every 6 (six) hours. 30 tablet 0  ? ALPRAZolam (XANAX XR) 0.5 MG 24 hr tablet Take one tablet (0.5 mg total) by mouth every night at bedtime. 30 tablet 5  ? ALPRAZolam (XANAX) 0.5 MG tablet Take one tablet (0.5  mg total) by mouth qam and may take one more tablet (0.5 mg total) qd prn for anxiety. 120 tablet 5  ? Ascorbic Acid (VITAMIN C) 1000 MG tablet Take 1,000 mg by mouth daily.    ? aspirin 81 MG tablet Take 1 tablet (81 mg total) by mouth daily. Restart on 08/31/20 30 tablet   ? bisacodyl (DULCOLAX) 5 MG EC tablet Take 5 mg by mouth daily as needed for moderate constipation.    ? carboxymethylcellulose (REFRESH PLUS) 0.5 % SOLN Place 1-2 drops into both eyes 3 (three) times daily as needed (dry eyes).    ? cephALEXin (KEFLEX) 500 MG capsule Take 1 capsule (500 mg total) by mouth 2 (two) times  daily. 14 capsule 0  ? Cholecalciferol (D3 PO) Take 1 capsule by mouth daily.    ? citalopram (CELEXA) 10 MG tablet Take 1 tablet (10 mg total) by mouth daily. 90 tablet 1  ? dexamethasone (DECADRON) 1 MG tablet Take 1 tablet (1 mg total) by mouth daily. 60 tablet 1  ? fluticasone (FLONASE) 50 MCG/ACT nasal spray PLACE 2 SPRAYS INTO BOTH NOSTRILS DAILY. 48 g 1  ? folic acid (FOLVITE) 1 MG tablet Take 1 tablet (1 mg total) by mouth daily. 30 tablet 2  ? Lacosamide 100 MG TABS Take 1 tablet by mouth twice daily 60 tablet 0  ? levETIRAcetam (KEPPRA) 100 MG/ML solution TAKE 15 ML BY MOUTH  TWICE DAILY 473 mL 0  ? loratadine (CLARITIN) 10 MG tablet Take 10 mg by mouth daily as needed for allergies.    ? Menthol, Topical Analgesic, (BIOFREEZE EX) Apply 1 application topically as needed (pain).    ? mirtazapine (REMERON) 45 MG tablet Take 1 tablet (45 mg total) by mouth at bedtime. 30 tablet 3  ? olopatadine (PATANOL) 0.1 % ophthalmic solution Place 1 drop into both eyes 2 (two) times daily as needed for allergies.    ? omeprazole (PRILOSEC) 40 MG capsule TAKE 1 CAPSULE TWICE DAILY BEFORE MEALS 180 capsule 0  ? polyethylene glycol (MIRALAX / GLYCOLAX) 17 g packet Take 17 g by mouth 2 (two) times daily. 72 each 0  ? pravastatin (PRAVACHOL) 40 MG tablet TAKE 1 TABLET (40 MG TOTAL) BY MOUTH EVERY EVENING. 90 tablet 0  ? Probiotic Product (PROBIOTIC DAILY PO) Take 1 capsule by mouth daily.    ? prochlorperazine (COMPAZINE) 10 MG tablet Take 1 tablet (10 mg total) by mouth every 6 (six) hours as needed for nausea or vomiting. 30 tablet 0  ? simethicone (MYLICON) 80 MG chewable tablet Chew 1 tablet (80 mg total) by mouth 4 (four) times daily as needed for flatulence (Bloating). 30 tablet 0  ? ?No current facility-administered medications for this visit.  ? ? ?SURGICAL HISTORY:  ?Past Surgical History:  ?Procedure Laterality Date  ? ABDOMINAL HYSTERECTOMY  1994  ? APPLICATION OF CRANIAL NAVIGATION N/A 08/24/2020  ? Procedure:  APPLICATION OF CRANIAL NAVIGATION;  Surgeon: Judith Part, MD;  Location: [facility];  Service: Neurosurgery;  Laterality: N/A;  ? COLONOSCOPY    ? COLONOSCOPY W/ POLYPECTOMY  2009  ? CRANIOTOMY Left 08/24/2020  ? Procedure: Left Craniotomy for Tumor Resection with Brainlab;  Surgeon: Judith Part, MD;  Location: [facility];  Service: Neurosurgery;  Laterality: Left;  ? HEMORRHOID SURGERY    ? INTERCOSTAL NERVE BLOCK Right 09/24/2020  ? Procedure: INTERCOSTAL NERVE BLOCK;  Surgeon: Melrose Nakayama, MD;  Location: [facility];  Service: Thoracic;  Laterality: Right;  ? JOINT  REPLACEMENT    ? LYMPH NODE DISSECTION Right 09/24/2020  ? Procedure: LYMPH NODE DISSECTION;  Surgeon: Melrose Nakayama, MD;  Location: [facility];  Service: Thoracic;  Laterality: Right;  ? POLYPECTOMY    ? right total knee arthroplasty    ? Dr. Emeterio Reeve 06-04-18  ? SPINE SURGERY  08/16/2009  ? TOTAL KNEE ARTHROPLASTY Left 02/13/2014  ? Procedure: TOTAL KNEE ARTHROPLASTY;  Surgeon: Alta Corning, MD;  Location: [facility];  Service: Orthopedics;  Laterality: Left;  ? TOTAL KNEE ARTHROPLASTY Right 06/04/2018  ? Procedure: RIGHT TOTAL KNEE ARTHROPLASTY;  Surgeon: Dorna Leitz, MD;  Location: [facility];  Service: Orthopedics;  Laterality: Right;  Adductor Block  ? TUBAL LIGATION    ? ? ?REVIEW OF SYSTEMS:  Constitutional: positive for fatigue ?Eyes: negative ?Ears, nose, mouth, throat, and face: negative ?Respiratory: negative ?Cardiovascular: negative ?Gastrointestinal: negative ?Genitourinary:negative ?Integument/breast: positive for rash ?Hematologic/lymphatic: negative ?Musculoskeletal:negative ?Neurological: positive for weakness ?Behavioral/Psych: negative ?Endocrine: negative ?Allergic/Immunologic: negative  ? ?PHYSICAL EXAMINATION: General appearance: alert, cooperative, fatigued, and no distress ?Head: Normocephalic, without obvious abnormality, atraumatic ?Neck: no adenopathy, no JVD, supple, symmetrical, trachea midline, and thyroid not enlarged,  symmetric, no tenderness/mass/nodules ?Lymph nodes: Cervical, supraclavicular, and axillary nodes normal. ?Resp: clear to auscultation bilaterally ?Back: symmetric, no curvature. ROM normal. No CVA tenderness. ?C

## 2021-10-24 NOTE — Patient Instructions (Signed)
[facility]  Discharge Instructions: ?Thank you for choosing [facility] to provide your oncology and hematology care.  ? ?If you have a lab appointment with the [facility], please go directly to the [facility] and check in at the registration area. ?  ?Wear comfortable clothing and clothing appropriate for easy access to any Portacath or PICC line.  ? ?We strive to give you quality time with your provider. You may need to reschedule your appointment if you arrive late (15 or more minutes).  Arriving late affects you and other patients whose appointments are after yours.  Also, if you miss three or more appointments without notifying the office, you may be dismissed from the clinic at the provider?s discretion.    ?  ?For prescription refill requests, have your pharmacy contact our office and allow 72 hours for refills to be completed.   ? ?Today you received the following chemotherapy and/or immunotherapy agents: Keytruda, Alimta, B12    ?  ?To help prevent nausea and vomiting after your treatment, we encourage you to take your nausea medication as directed. ? ?BELOW ARE SYMPTOMS THAT SHOULD BE REPORTED IMMEDIATELY: ?*FEVER GREATER THAN 100.4 F (38 ?C) OR HIGHER ?*CHILLS OR SWEATING ?*NAUSEA AND VOMITING THAT IS NOT CONTROLLED WITH YOUR NAUSEA MEDICATION ?*UNUSUAL SHORTNESS OF BREATH ?*UNUSUAL BRUISING OR BLEEDING ?*URINARY PROBLEMS (pain or burning when urinating, or frequent urination) ?*BOWEL PROBLEMS (unusual diarrhea, constipation, pain near the anus) ?TENDERNESS IN MOUTH AND THROAT WITH OR WITHOUT PRESENCE OF ULCERS (sore throat, sores in mouth, or a toothache) ?UNUSUAL RASH, SWELLING OR PAIN  ?UNUSUAL VAGINAL DISCHARGE OR ITCHING  ? ?Items with * indicate a potential emergency and should be followed up as soon as possible or go to the Emergency Department if any problems should occur. ? ?Please show the CHEMOTHERAPY ALERT CARD or IMMUNOTHERAPY ALERT CARD at  check-in to the Emergency Department and triage nurse. ? ?Should you have questions after your visit or need to cancel or reschedule your appointment, please contact [facility]  Dept: [phone]  and follow the prompts.  Office hours are 8:00 a.m. to 4:30 p.m. Monday - Friday. Please note that voicemails left after 4:00 p.m. may not be returned until the following business day.  We are closed weekends and major holidays. You have access to a nurse at all times for urgent questions. Please call the main number to the clinic Dept: [phone] and follow the prompts. ? ? ?For any non-urgent questions, you may also contact your provider using MyChart. We now offer e-Visits for anyone 3 and older to request care online for non-urgent symptoms. For details visit [email or web address]. ?  ?Also download the MyChart app! Go to the app store, search "MyChart", open the app, select [facility], and log in with your MyChart username and password. ? ?Due to Covid, a mask is required upon entering the hospital/clinic. If you do not have a mask, one will be given to you upon arrival. For doctor visits, patients may have 1 support person aged 22 or older with them. For treatment visits, patients cannot have anyone with them due to current Covid guidelines and our immunocompromised population.  ? ?

## 2021-10-27 ENCOUNTER — Other Ambulatory Visit: Payer: Self-pay | Admitting: Internal Medicine

## 2021-10-27 ENCOUNTER — Encounter (HOSPITAL_COMMUNITY): Payer: Self-pay

## 2021-10-27 ENCOUNTER — Emergency Department (HOSPITAL_COMMUNITY): Payer: Medicare HMO

## 2021-10-27 ENCOUNTER — Emergency Department (HOSPITAL_COMMUNITY)
Admission: EM | Admit: 2021-10-27 | Discharge: 2021-10-27 | Disposition: A | Discharge status: Home / Self Care | Payer: Medicare HMO | Attending: Emergency Medicine | Admitting: Emergency Medicine

## 2021-10-27 DIAGNOSIS — Z96653 Presence of artificial knee joint, bilateral: Secondary | ICD-10-CM | POA: Diagnosis not present

## 2021-10-27 DIAGNOSIS — N3001 Acute cystitis with hematuria: Secondary | ICD-10-CM | POA: Diagnosis not present

## 2021-10-27 DIAGNOSIS — I10 Essential (primary) hypertension: Secondary | ICD-10-CM

## 2021-10-27 DIAGNOSIS — C800 Disseminated malignant neoplasm, unspecified: Secondary | ICD-10-CM | POA: Diagnosis not present

## 2021-10-27 DIAGNOSIS — C3491 Malignant neoplasm of unspecified part of right bronchus or lung: Secondary | ICD-10-CM | POA: Diagnosis not present

## 2021-10-27 DIAGNOSIS — Z87891 Personal history of nicotine dependence: Secondary | ICD-10-CM | POA: Insufficient documentation

## 2021-10-27 DIAGNOSIS — N390 Urinary tract infection, site not specified: Secondary | ICD-10-CM | POA: Diagnosis not present

## 2021-10-27 DIAGNOSIS — Z79899 Other long term (current) drug therapy: Secondary | ICD-10-CM | POA: Diagnosis not present

## 2021-10-27 DIAGNOSIS — R4182 Altered mental status, unspecified: Secondary | ICD-10-CM | POA: Diagnosis not present

## 2021-10-27 DIAGNOSIS — Z85118 Personal history of other malignant neoplasm of bronchus and lung: Secondary | ICD-10-CM | POA: Insufficient documentation

## 2021-10-27 DIAGNOSIS — R519 Headache, unspecified: Secondary | ICD-10-CM | POA: Diagnosis not present

## 2021-10-27 DIAGNOSIS — C719 Malignant neoplasm of brain, unspecified: Secondary | ICD-10-CM | POA: Diagnosis not present

## 2021-10-27 DIAGNOSIS — Z7982 Long term (current) use of aspirin: Secondary | ICD-10-CM | POA: Diagnosis not present

## 2021-10-27 DIAGNOSIS — R410 Disorientation, unspecified: Secondary | ICD-10-CM | POA: Diagnosis not present

## 2021-10-27 DIAGNOSIS — C799 Secondary malignant neoplasm of unspecified site: Secondary | ICD-10-CM

## 2021-10-27 LAB — URINALYSIS, ROUTINE W REFLEX MICROSCOPIC
Bilirubin Urine: NEGATIVE
Glucose, UA: NEGATIVE mg/dL
Hgb urine dipstick: NEGATIVE
Ketones, ur: NEGATIVE mg/dL
Nitrite: NEGATIVE
Protein, ur: 100 mg/dL — AB
Specific Gravity, Urine: 1.015 (ref 1.005–1.030)
WBC, UA: >50 WBC/hpf — ABNORMAL HIGH (ref 0–5)
pH: 9 — ABNORMAL HIGH (ref 5.0–8.0)

## 2021-10-27 LAB — COMPREHENSIVE METABOLIC PANEL
ALT: 30 U/L (ref 0–44)
AST: 26 U/L (ref 15–41)
Albumin: 3.5 g/dL (ref 3.5–5.0)
Alkaline Phosphatase: 50 U/L (ref 38–126)
Anion gap: 6 (ref 5–15)
BUN: 16 mg/dL (ref 8–23)
CO2: 27 mmol/L (ref 22–32)
Calcium: 9.4 mg/dL (ref 8.9–10.3)
Chloride: 104 mmol/L (ref 98–111)
Creatinine, Ser: 0.78 mg/dL (ref 0.44–1.00)
GFR, Estimated: >60 mL/min (ref >60)
Glucose, Bld: 168 mg/dL — ABNORMAL HIGH (ref 70–99)
Potassium: 4.2 mmol/L (ref 3.5–5.1)
Sodium: 137 mmol/L (ref 135–145)
Total Bilirubin: 0.8 mg/dL (ref 0.3–1.2)
Total Protein: 7.1 g/dL (ref 6.5–8.1)

## 2021-10-27 LAB — CBC
HCT: 38.4 % (ref 36.0–46.0)
Hemoglobin: 12.3 g/dL (ref 12.0–15.0)
MCH: 29.3 pg (ref 26.0–34.0)
MCHC: 32 g/dL (ref 30.0–36.0)
MCV: 91.4 fL (ref 80.0–100.0)
Platelets: 174 10*3/uL (ref 150–400)
RBC: 4.2 MIL/uL (ref 3.87–5.11)
RDW: 13.3 % (ref 11.5–15.5)
WBC: 8 10*3/uL (ref 4.0–10.5)
nRBC: 0 % (ref 0.0–0.2)

## 2021-10-27 LAB — AMMONIA: Ammonia: 22 umol/L (ref 9–35)

## 2021-10-27 LAB — CBG MONITORING, ED: Glucose-Capillary: 164 mg/dL — ABNORMAL HIGH (ref 70–99)

## 2021-10-27 MED ORDER — KETOROLAC TROMETHAMINE 15 MG/ML IJ SOLN
15.0000 mg | Freq: Once | INTRAMUSCULAR | Status: AC
  Administered 2021-10-27: 15 mg via INTRAVENOUS
  Filled 2021-10-27: qty 1

## 2021-10-27 MED ORDER — CEFPODOXIME PROXETIL 200 MG PO TABS
200.0000 mg | ORAL_TABLET | Freq: Two times a day (BID) | ORAL | 0 refills | Status: AC
Start: 2021-10-27 — End: 2021-11-06

## 2021-10-27 MED ORDER — CEFTRIAXONE SODIUM 1 G IJ SOLR
1.0000 g | Freq: Once | INTRAMUSCULAR | Status: AC
Start: 2021-10-27 — End: 2021-10-27
  Administered 2021-10-27: 1 g via INTRAVENOUS
  Filled 2021-10-27: qty 10

## 2021-10-27 NOTE — ED Triage Notes (Signed)
Pt BIB [facility] from home for confusion starting last night. Pt is A&Ox4, states she feels confused, light bothers eyes. Denies balance issues, denies chest pain and SHOB. Endorses HA in occipital area off and on since Litt procedure on 4/17. Hx lung cancer mets to brain.  ? ?138/64 ?98% ?94 HR ?20 RR ?97 temp ?

## 2021-10-27 NOTE — ED Provider Notes (Signed)
?[facility] DEPT ?Provider Note ? ? ?CSN: [id number] ?Arrival date & time: 10/27/21  1015 ? ?  ? ?History ? ?Chief Complaint  ?Patient presents with  ? Altered Mental Status  ? Headache  ? ? ?Hannah [name] is a 71 y.o. female. ? ? Patient as above with significant medical history as below, including anxiety, anemia, hyperlipidemia, lung cancer with metastatic disease to the brain.  Who presents to the ED with complaint of confusion, anxiousness, mild headache, blurry vision, fatigue.  Patient reports that she was feeling unwell all day yesterday, did not get out of bed.  Having some intermittent blurry vision, headaches, feeling confused and not herself.  Denies falls.  Denies any numbness or tingling.  No difficulty speaking or swallowing. ? ?Nausea or vomiting, no fevers chills, no chest pain or dyspnea, no URI symptoms.  No change in bowel function.  Some mild dysuria.  Compliant to medications. ? ? ?She is status post LITT procedure 2 weeks ago ? ? ?Past Medical History: ?No date: Allergy ?No date: Anemia ?No date: Anxiety ?No date: Arthritis ?No date: Carpal tunnel syndrome ?No date: Constipation ?    Comment:  senna C stool softeners help  ?No date: Depression ?No date: Diverticulosis ?No date: Dyslipidemia ?No date: External hemorrhoids ?No date: GERD (gastroesophageal reflux disease) ?No date: Heart murmur ?    Comment:  mild-moderate AR ?No date: Hiatal hernia ?No date: Hyperlipidemia ?    Comment:  on meds  ?No date: Hypertension ?No date: Internal hemorrhoids ?07/2020: lung ca with brain mets ?No date: Osteoarthritis ?No date: Pre-diabetes ?No date: PVD (peripheral vascular disease) ([place]) ?    Comment:  moderate carotid disease ?No date: RBBB ?No date: Smoker ?No date: Vocal cord polyps ? ?Past Surgical History: ?1994: ABDOMINAL HYSTERECTOMY ?02/26/5620: APPLICATION OF CRANIAL NAVIGATION; N/A ?    Comment:  Procedure: APPLICATION OF CRANIAL NAVIGATION;  Surgeon:  ?              Judith Part, MD;  Location: [place];  Service:  ?             Neurosurgery;  Laterality: N/A; ?No date: COLONOSCOPY ?2009: COLONOSCOPY W/ POLYPECTOMY ?08/24/2020: CRANIOTOMY; Left ?    Comment:  Procedure: Left Craniotomy for Tumor Resection with  ?             Brainlab;  Surgeon: Judith Part, MD;  Location:  ?             [place];  Service: Neurosurgery;  Laterality: Left; ?No date: HEMORRHOID SURGERY ?09/24/2020: INTERCOSTAL NERVE BLOCK; Right ?    Comment:  Procedure: INTERCOSTAL NERVE BLOCK;  Surgeon:  ?             Melrose Nakayama, MD;  Location: [place];  Service:  ?             Thoracic;  Laterality: Right; ?No date: JOINT REPLACEMENT ?09/24/2020: LYMPH NODE DISSECTION; Right ?    Comment:  Procedure: LYMPH NODE DISSECTION;  Surgeon: Roxan Hockey, ?             Revonda Standard, MD;  Location: [place];  Service: Thoracic;   ?             Laterality: Right; ?No date: POLYPECTOMY ?No date: right total knee arthroplasty ?    Comment:  Dr. Emeterio Reeve 06-04-18 ?08/16/2009: [place] ?02/13/2014: TOTAL KNEE ARTHROPLASTY; Left ?    Comment:  Procedure: TOTAL KNEE ARTHROPLASTY;  Surgeon: Karen Chafe  ?             Berenice Primas, MD;  Location: [place];  Service: Orthopedics;   ?             Laterality: Left; ?06/04/2018: TOTAL KNEE ARTHROPLASTY; Right ?    Comment:  Procedure: RIGHT TOTAL KNEE ARTHROPLASTY;  Surgeon:  ?             Dorna Leitz, MD;  Location: [facility];  Service:  ?             Orthopedics;  Laterality: Right;  Adductor Block ?No date: TUBAL LIGATION  ? ? ?The history is provided by the patient. No language interpreter was used.  ?Altered Mental Status ?Presenting symptoms: confusion   ?Associated symptoms: headaches   ?Associated symptoms: no abdominal pain, no agitation, no fever, no nausea, no palpitations, no rash and no vomiting   ?Headache ?Associated symptoms: fatigue   ?Associated symptoms: no abdominal pain, no cough, no fever, no nausea, no photophobia and no vomiting   ? ?  ? ?Home Medications ?Prior to  Admission medications   ?Medication Sig Start Date End Date Taking? Authorizing Provider  ?acetaminophen (TYLENOL) 325 MG tablet Take 325 mg by mouth every 6 (six) hours as needed for moderate pain.   Yes [provider]  ?ALPRAZolam (XANAX XR) 0.5 MG 24 hr tablet Take one tablet (0.5 mg total) by mouth every night at bedtime. 07/24/21  Yes Plovsky, Berneta Sages, MD  ?ALPRAZolam Duanne Moron) 0.5 MG tablet Take one tablet (0.5 mg total) by mouth qam and may take one more tablet (0.5 mg total) qd prn for anxiety. ?Patient taking differently: Take 0.5 mg by mouth at bedtime as needed for anxiety. Take one tablet (0.5 mg XR total) by mouth qam and may take one more tablet (0.5 mg total) qd prn for anxiety. 07/24/21  Yes Plovsky, Berneta Sages, MD  ?Ascorbic Acid (VITAMIN C) 1000 MG tablet Take 1,000 mg by mouth daily.   Yes [provider]  ?aspirin 81 MG tablet Take 1 tablet (81 mg total) by mouth daily. Restart on 08/31/20 08/25/20  Yes Judith Part, MD  ?bisacodyl (DULCOLAX) 5 MG EC tablet Take 5 mg by mouth daily as needed for moderate constipation.   Yes [provider]  ?carboxymethylcellulose (REFRESH PLUS) 0.5 % SOLN Place 1-2 drops into both eyes 3 (three) times daily as needed (dry eyes).   Yes [provider]  ?cefpodoxime (VANTIN) 200 MG tablet Take 1 tablet (200 mg total) by mouth 2 (two) times daily for 10 days. 10/27/21 11/06/21 Yes Jeanell Sparrow, DO  ?Cholecalciferol (D3 PO) Take 1 capsule by mouth daily.   Yes [provider]  ?citalopram (CELEXA) 10 MG tablet Take 1 tablet (10 mg total) by mouth daily. 10/21/21  Yes Burns, Claudina Lick, MD  ?fluticasone (FLONASE) 50 MCG/ACT nasal spray PLACE 2 SPRAYS INTO BOTH NOSTRILS DAILY. ?Patient taking differently: Place 2 sprays into both nostrils daily as needed for allergies. 09/05/21  Yes Burns, Claudina Lick, MD  ?folic acid (FOLVITE) 1 MG tablet Take 1 tablet (1 mg total) by mouth daily. 09/26/21  Yes Heilingoetter, Cassandra L, PA-C   ?Lacosamide 100 MG TABS Take 1 tablet by mouth twice daily 10/21/21  Yes Vaslow, Acey Lav, MD  ?levETIRAcetam (KEPPRA) 100 MG/ML solution TAKE 15 ML BY MOUTH  TWICE DAILY ?Patient taking differently: Take 1,500 mg by mouth 2 (two) times daily. 10/11/21  Yes Vaslow, Acey Lav, MD  ?  loratadine (CLARITIN) 10 MG tablet Take 10 mg by mouth daily as needed for allergies.   Yes [provider]  ?magnesium hydroxide (MILK OF MAGNESIA) 800 MG/5ML suspension Take 30 mLs by mouth daily as needed for constipation.   Yes [provider]  ?Menthol, Topical Analgesic, (BIOFREEZE EX) Apply 1 application topically as needed (pain).   Yes [provider]  ?mirtazapine (REMERON) 45 MG tablet Take 1 tablet (45 mg total) by mouth at bedtime. 07/24/21  Yes Plovsky, Berneta Sages, MD  ?olopatadine (PATANOL) 0.1 % ophthalmic solution Place 1 drop into both eyes 2 (two) times daily as needed for allergies.   Yes [provider]  ?omeprazole (PRILOSEC) 40 MG capsule TAKE 1 CAPSULE TWICE DAILY BEFORE MEALS ?Patient taking differently: Take 40 mg by mouth daily. 09/09/21  Yes Burns, Claudina Lick, MD  ?oxyCODONE (OXY IR/ROXICODONE) 5 MG immediate release tablet Take 5 mg by mouth every 8 (eight) hours as needed. 10/15/21  Yes [provider]  ?polyethylene glycol (MIRALAX / GLYCOLAX) 17 g packet Take 17 g by mouth 2 (two) times daily. 08/19/20  Yes Eugenie Filler, MD  ?pravastatin (PRAVACHOL) 40 MG tablet TAKE 1 TABLET (40 MG TOTAL) BY MOUTH EVERY EVENING. ?Patient taking differently: Take 40 mg by mouth daily. 09/09/21  Yes Burns, Claudina Lick, MD  ?Probiotic Product (PROBIOTIC DAILY PO) Take 1 capsule by mouth daily.   Yes [provider]  ?prochlorperazine (COMPAZINE) 10 MG tablet Take 1 tablet (10 mg total) by mouth every 6 (six) hours as needed for nausea or vomiting. 10/23/20  Yes Curt Bears, MD  ?simethicone (MYLICON) 80 MG chewable tablet Chew 1 tablet (80 mg total) by mouth 4 (four) times daily  as needed for flatulence (Bloating). 09/28/20  Yes Elgie Collard, PA-C  ?acetaminophen (TYLENOL) 500 MG tablet Take 2 tablets (1,000 mg total) by mouth every 6 (six) hours. ?Patient not taking: Reported on 4/30/

## 2021-10-27 NOTE — Discharge Instructions (Signed)
It was a pleasure caring for you today in the emergency department. ° °Please return to the emergency department for any worsening or worrisome symptoms. ° ° °

## 2021-10-28 ENCOUNTER — Encounter: Payer: Self-pay | Admitting: Internal Medicine

## 2021-10-29 LAB — URINE CULTURE: Culture: >=100000 — AB

## 2021-10-30 ENCOUNTER — Encounter: Payer: Self-pay | Admitting: Internal Medicine

## 2021-10-30 ENCOUNTER — Telehealth: Payer: Self-pay | Admitting: Emergency Medicine

## 2021-10-30 DIAGNOSIS — N3000 Acute cystitis without hematuria: Secondary | ICD-10-CM

## 2021-10-30 NOTE — Telephone Encounter (Signed)
Post ED Visit - Positive Culture Follow-up ? ?Culture report reviewed by antimicrobial stewardship pharmacist: ?[place] Team ?[x]  Elenor Quinones, [place].D. ?[]  Heide Guile, Pharm.D., BCPS AQ-ID ?[]  Parks Neptune, Pharm.D., BCPS ?[]  Alycia Rossetti, Pharm.D., BCPS ?[]  [place], Pharm.D., BCPS, AAHIVP ?[]  Legrand Como, Pharm.D., BCPS, AAHIVP ?[]  Salome Arnt, PharmD, BCPS ?[]  Johnnette Gourd, PharmD, BCPS ?[]  Hughes Better, PharmD, BCPS ?[]  Leeroy Cha, PharmD ?[]  Laqueta Linden, PharmD, BCPS ?[]  Albertina Parr, PharmD ? ?[place] Team ?[]  Leodis Sias, PharmD ?[]  Lindell Spar, PharmD ?[]  Royetta Asal, PharmD ?[]  Graylin Shiver, Rph ?[]  Rema Fendt) Glennon Mac, PharmD ?[]  Arlyn Dunning, PharmD ?[]  Netta Cedars, PharmD ?[]  Dia Sitter, PharmD ?[]  Leone Haven, PharmD ?[]  Gretta Arab, PharmD ?[]  Theodis Shove, PharmD ?[]  Peggyann Juba, PharmD ?[]  Reuel Boom, PharmD ? ? ?Positive urine culture ?Treated with cefpodoxime, organism sensitive to the same and no further patient follow-up is required at this time. ? ?Hazle Nordmann ?10/30/2021, 1:20 PM ?  ?

## 2021-10-31 ENCOUNTER — Other Ambulatory Visit (INDEPENDENT_AMBULATORY_CARE_PROVIDER_SITE_OTHER): Payer: Medicare HMO

## 2021-10-31 DIAGNOSIS — N3000 Acute cystitis without hematuria: Secondary | ICD-10-CM | POA: Diagnosis not present

## 2021-10-31 LAB — URINALYSIS, ROUTINE W REFLEX MICROSCOPIC
Bilirubin Urine: NEGATIVE
Hgb urine dipstick: NEGATIVE
Ketones, ur: NEGATIVE
Leukocytes,Ua: NEGATIVE
Nitrite: NEGATIVE
Specific Gravity, Urine: 1.02 (ref 1.000–1.030)
Total Protein, Urine: NEGATIVE
Urine Glucose: NEGATIVE
Urobilinogen, UA: 0.2 (ref 0.0–1.0)
pH: 5.5 (ref 5.0–8.0)

## 2021-11-01 LAB — URINE CULTURE
MICRO NUMBER:: 13351816
Result:: NO GROWTH
SPECIMEN QUALITY:: ADEQUATE

## 2021-11-05 ENCOUNTER — Ambulatory Visit
Admission: RE | Admit: 2021-11-05 | Discharge: 2021-11-05 | Disposition: A | Discharge status: Home / Self Care | Payer: Medicare HMO | Source: Ambulatory Visit | Attending: Radiation Oncology | Admitting: Radiation Oncology

## 2021-11-05 ENCOUNTER — Other Ambulatory Visit: Payer: Self-pay

## 2021-11-05 VITALS — BP 139/76 | HR 88 | Temp 97.6°F | Resp 20 | Ht 60.0 in | Wt 136.2 lb

## 2021-11-05 DIAGNOSIS — Z510 Encounter for antineoplastic radiation therapy: Secondary | ICD-10-CM | POA: Diagnosis not present

## 2021-11-05 DIAGNOSIS — C3411 Malignant neoplasm of upper lobe, right bronchus or lung: Secondary | ICD-10-CM | POA: Insufficient documentation

## 2021-11-05 DIAGNOSIS — C3490 Malignant neoplasm of unspecified part of unspecified bronchus or lung: Secondary | ICD-10-CM

## 2021-11-05 DIAGNOSIS — C7931 Secondary malignant neoplasm of brain: Secondary | ICD-10-CM | POA: Insufficient documentation

## 2021-11-05 MED ORDER — SODIUM CHLORIDE 0.9% FLUSH
10.0000 mL | Freq: Once | INTRAVENOUS | Status: AC
  Administered 2021-11-05: 10 mL via INTRAVENOUS

## 2021-11-05 MED ORDER — HEPARIN SOD (PORK) LOCK FLUSH 100 UNIT/ML IV SOLN
500.0000 [IU] | Freq: Once | INTRAVENOUS | Status: AC
  Administered 2021-11-05: 500 [IU] via INTRAVENOUS

## 2021-11-05 NOTE — Progress Notes (Signed)
Has armband been applied?  yes ? ?Does patient have an allergy to IV contrast dye?: no ?  ?Has patient ever received premedication for IV contrast dye?: no  ? ?Does patient take metformin?: no ? ?If patient does take metformin when was the last dose: NA ? ?Date of lab work: 10/24/2021 ? ?BUN: 18 ?CR: 0.96 ? ?IV site:  right anticubital ? ?Has IV site been added to flowsheet?  yes ? ?BP 139/76 (BP Location: Left Arm, Patient Position: Sitting, Cuff Size: Normal)   Pulse 88   Temp 97.6 ?F (36.4 ?C) (Oral)   Resp 20   Ht 5' (1.524 m)   Wt 136 lb 3.2 oz (61.8 kg)   SpO2 99%   BMI 26.60 kg/m?   ?

## 2021-11-06 ENCOUNTER — Ambulatory Visit
Admission: RE | Admit: 2021-11-06 | Discharge: 2021-11-06 | Disposition: A | Discharge status: Home / Self Care | Payer: Medicare HMO | Source: Ambulatory Visit | Attending: Radiation Oncology | Admitting: Radiation Oncology

## 2021-11-06 DIAGNOSIS — G936 Cerebral edema: Secondary | ICD-10-CM | POA: Diagnosis not present

## 2021-11-06 DIAGNOSIS — C7931 Secondary malignant neoplasm of brain: Secondary | ICD-10-CM | POA: Diagnosis not present

## 2021-11-06 DIAGNOSIS — J329 Chronic sinusitis, unspecified: Secondary | ICD-10-CM | POA: Diagnosis not present

## 2021-11-06 DIAGNOSIS — Z9889 Other specified postprocedural states: Secondary | ICD-10-CM | POA: Diagnosis not present

## 2021-11-06 MED ORDER — GADOBENATE DIMEGLUMINE 529 MG/ML IV SOLN
12.0000 mL | Freq: Once | INTRAVENOUS | Status: AC | PRN
  Administered 2021-11-06: 12 mL via INTRAVENOUS

## 2021-11-07 ENCOUNTER — Ambulatory Visit: Payer: Medicare HMO

## 2021-11-07 ENCOUNTER — Ambulatory Visit: Payer: Medicare HMO | Admitting: Internal Medicine

## 2021-11-07 ENCOUNTER — Ambulatory Visit
Admission: RE | Admit: 2021-11-07 | Discharge: 2021-11-07 | Disposition: A | Discharge status: Home / Self Care | Payer: Medicare HMO | Source: Ambulatory Visit | Attending: Radiation Oncology | Admitting: Radiation Oncology

## 2021-11-07 ENCOUNTER — Other Ambulatory Visit: Payer: Self-pay

## 2021-11-07 ENCOUNTER — Other Ambulatory Visit: Payer: Medicare HMO

## 2021-11-07 ENCOUNTER — Encounter: Payer: Self-pay | Admitting: Radiation Oncology

## 2021-11-07 VITALS — BP 130/87 | HR 97 | Temp 97.3°F

## 2021-11-07 DIAGNOSIS — C3490 Malignant neoplasm of unspecified part of unspecified bronchus or lung: Secondary | ICD-10-CM

## 2021-11-07 DIAGNOSIS — C7931 Secondary malignant neoplasm of brain: Secondary | ICD-10-CM | POA: Diagnosis not present

## 2021-11-07 DIAGNOSIS — C3411 Malignant neoplasm of upper lobe, right bronchus or lung: Secondary | ICD-10-CM | POA: Diagnosis not present

## 2021-11-07 DIAGNOSIS — Z510 Encounter for antineoplastic radiation therapy: Secondary | ICD-10-CM | POA: Diagnosis not present

## 2021-11-07 LAB — RAD ONC ARIA SESSION SUMMARY
Course Elapsed Days: 0
Plan Fractions Treated to Date: 1
Plan Prescribed Dose Per Fraction: 5.5 Gy
Plan Total Fractions Prescribed: 5
Plan Total Prescribed Dose: 27.5 Gy
Reference Point Dosage Given to Date: 5.5 Gy
Reference Point Session Dosage Given: 5.5 Gy
Session Number: 1

## 2021-11-07 NOTE — Progress Notes (Signed)
?  Radiation Oncology         (336) [phone] ?________________________________ ? ?Stereotactic Treatment Procedure Note ? ?Name: Hannah [name] MRN: [id number]  ?Date: 11/07/2021  DOB: 21-Dec-1950 ? ?SPECIAL TREATMENT PROCEDURE ? ?  ICD-10-CM   ?1. Primary malignant neoplasm of lung with metastasis to brain [facility])  C34.90   ? C79.31   ?  ? ? ?3D TREATMENT PLANNING AND DOSIMETRY:  The patient's radiation plan was reviewed and approved by neurosurgery and radiation oncology prior to treatment.  It showed 3-dimensional radiation distributions overlaid onto the planning CT/MRI image set.  The [facility] for the target structures as well as the organs at risk were reviewed. The documentation of the 3D plan and dosimetry are filed in the radiation oncology EMR. ? ?NARRATIVE:  Hannah [name] was brought to the TrueBeam stereotactic radiation treatment machine and placed supine on the CT couch. The head frame was applied, and the patient was set up for stereotactic radiosurgery.  Neurosurgery was present for the set-up and delivery ? ?SIMULATION VERIFICATION:  In the couch zero-angle position, the patient underwent Exactrac imaging using the Brainlab system with orthogonal KV images.  These were carefully aligned and repeated to confirm treatment position for each of the isocenters.  The Exactrac snap film verification was repeated at each couch angle. ? ?PROCEDURE: [name] received stereotactic radiosurgery to the following targets: ?Left parietal 26 mm target was treated using 5 Rapid Arc VMAT Beams to a fractional prescription dose of 5.5 Gy to be repeated to a total of 5 fractions for a cumulative prescription dose of 27.5 Gy.  ExacTrac registration was performed for each couch angle.  The 100% isodose line was prescribed.  6 MV X-rays were delivered in the flattening filter free beam mode. ? ? ?STEREOTACTIC TREATMENT MANAGEMENT:  Following delivery, the patient was transported to nursing in stable condition and monitored  for possible acute effects.  Vital signs were recorded . The patient tolerated treatment without significant acute effects, and was discharged to home in stable condition.   ? ?PLAN: Follow-up in one month. ? ?________________________________ ? ?Sheral Apley Tammi Klippel, M.D. ? ? ?

## 2021-11-07 NOTE — Progress Notes (Signed)
Nurse monitoring complete status post 1 of 5 SRS treatments. Hannah [name] rested w/ [place] for 32min without complaints, post SRS treatments. Patient denies new or worsening neurologic symptoms, including headache, dizziness, nausea, diplopia or ringing in the ears or fatigue. Vitals stable. Instructed patient to avoid strenuous activity for the next 24 hours. Instructed patient to call [phone] with needs related to treatment after hours or over the weekend. Patient verbalized understanding.  ? ? ?

## 2021-11-07 NOTE — Progress Notes (Signed)
?  Radiation Oncology         (336) [phone] ?________________________________ ? ?Name: Hannah [name] MRN: [id number]  ?Date: 11/05/2021  DOB: 24-Oct-1950 ? ?SIMULATION AND TREATMENT PLANNING NOTE ? ?  ICD-10-CM   ?1. Metastasis to brain [facility])  C79.31 sodium chloride flush (NS) 0.9 % injection 10 mL  ?  heparin lock flush 100 unit/mL  ?  ?2. Primary malignant neoplasm of lung metastatic to other site, unspecified laterality (HCC)  C34.90 sodium chloride flush (NS) 0.9 % injection 10 mL  ?  ?3. Primary malignant neoplasm of lung with metastasis to brain [facility])  C34.90   ? C79.31   ?  ?4. Primary cancer of right upper lobe of lung (HCC)  C34.11   ?  ? ? ?DIAGNOSIS:  71 yo woman with a solitary recurrent brain metastasis s/p previous SRS and salvage LITT from adenocarcinoma of the right upper lung    ? ?NARRATIVE:  The patient was brought to the [facility].  Identity was confirmed.  All relevant records and images related to the planned course of therapy were reviewed.  The patient freely provided informed written consent to proceed with treatment after reviewing the details related to the planned course of therapy. The consent form was witnessed and verified by the simulation staff. Intravenous access was established for contrast administration. Then, the patient was set-up in a stable reproducible supine position for radiation therapy.  A relocatable thermoplastic stereotactic head frame was fabricated for precise immobilization.  CT images were obtained.  Surface markings were placed.  The CT images were loaded into the planning software and fused with the patient's targeting MRI scan.  Then the target and avoidance structures were contoured.  Treatment planning then occurred.  The radiation prescription was entered and confirmed.  I have requested 3D planning  I have requested a DVH of the following structures: Brain stem, brain, left eye, right eye, lenses, optic chiasm, target volumes, uninvolved  brain, and normal tissue.   ? ?SPECIAL TREATMENT PROCEDURE:  The planned course of therapy using radiation constitutes a special treatment procedure. Special care is required in the management of this patient for the following reasons. This treatment constitutes a Special Treatment Procedure for the following reason: High dose per fraction requiring special monitoring for increased toxicities of treatment including daily imaging.  The special nature of the planned course of radiotherapy will require increased physician supervision and oversight to ensure patient's safety with optimal treatment outcomes.  This requires extended time and effort. ? ?PLAN:  The patient will receive 27.5 Gy in 5 fractions of 5.5 Gy. ? ?________________________________ ? ?Sheral Apley Tammi Klippel, M.D. ? ?

## 2021-11-07 NOTE — Addendum Note (Signed)
Encounter addended by: Mollie Germany, LPN on: 7/91/5041 3:64 PM ? Actions taken: Vitals modified

## 2021-11-08 ENCOUNTER — Telehealth: Payer: Self-pay

## 2021-11-08 NOTE — Telephone Encounter (Signed)
T/C from pt's daughter, [name], stating Ms Leamy has been having increased amount of pain in the forehead.  She has been taking the normal 325 mg tylenol PRN but did not know if there was a specific mg/dose she can or should be taking.  Please advise. ?

## 2021-11-08 NOTE — Addendum Note (Signed)
Encounter addended by: Judith Part, MD on: 11/08/2021 8:16 PM ? Actions taken: Clinical Note Signed

## 2021-11-08 NOTE — Op Note (Signed)
?  Name: Hannah [name]  MRN: [id number]  ?Date: 11/07/2021   DOB: 01/08/1951 ? ?Stereotactic Radiosurgery Operative Note ? ?PRE-OPERATIVE DIAGNOSIS:  Metastatic brain tumor  ? ?POST-OPERATIVE DIAGNOSIS:  Same ? ?PROCEDURE:  Stereotactic Radiosurgery ? ?SURGEON:  Judith Part, MD ? ?NARRATIVE: The patient underwent a radiation treatment planning session in the radiation oncology simulation suite under the care of the radiation oncology physician and physicist.  I participated closely in the radiation treatment planning afterwards. The patient underwent planning CT which was fused to 3T high resolution MRI with 1 mm axial slices.  These images were fused on the planning system.  We contoured the gross target volumes and subsequently expanded this to yield the Planning Target Volume. I actively participated in the planning process.  I helped to define and review the target contours and also the contours of the optic pathway, eyes, brainstem and selected nearby organs at risk.  All the dose constraints for critical structures were reviewed and compared to AAPM Task Group 101.  The prescription dose conformity was reviewed.  I approved the plan electronically.   ? ?Accordingly, Hannah [name] was brought to the TrueBeam stereotactic radiation treatment linac and placed in the custom immobilization mask.  The patient was aligned according to the IR fiducial markers with BrainLab Exactrac, then orthogonal x-rays were used in ExacTrac with the 6DOF robotic table and the shifts were made to align the patient ? ?Hannah [name] received stereotactic radiosurgery uneventfully.   ? ?Lesions treated:  1  ? ?Complex lesions treated:  0 (>3.5 cm, <29mm of optic path, or within the brainstem)  ? ?The detailed description of the procedure is recorded in the radiation oncology procedure note.  I was present for the duration of the procedure. ? ?DISPOSITION:  Following delivery, the patient was transported to nursing in stable  condition and monitored for possible acute effects to be discharged to home in stable condition with follow-up in one month. ? ?Judith Part, MD ?11/08/2021 8:15 PM ? ?

## 2021-11-09 ENCOUNTER — Other Ambulatory Visit: Payer: Self-pay | Admitting: Internal Medicine

## 2021-11-10 ENCOUNTER — Other Ambulatory Visit: Payer: Self-pay | Admitting: Internal Medicine

## 2021-11-11 ENCOUNTER — Other Ambulatory Visit: Payer: Self-pay

## 2021-11-11 ENCOUNTER — Encounter: Payer: Self-pay | Admitting: Internal Medicine

## 2021-11-11 ENCOUNTER — Ambulatory Visit
Admission: RE | Admit: 2021-11-11 | Discharge: 2021-11-11 | Disposition: A | Discharge status: Home / Self Care | Payer: Medicare HMO | Source: Ambulatory Visit | Attending: Radiation Oncology | Admitting: Radiation Oncology

## 2021-11-11 VITALS — BP 128/83 | HR 91 | Temp 98.5°F | Resp 17

## 2021-11-11 DIAGNOSIS — Z510 Encounter for antineoplastic radiation therapy: Secondary | ICD-10-CM | POA: Diagnosis not present

## 2021-11-11 DIAGNOSIS — C3490 Malignant neoplasm of unspecified part of unspecified bronchus or lung: Secondary | ICD-10-CM

## 2021-11-11 DIAGNOSIS — C3411 Malignant neoplasm of upper lobe, right bronchus or lung: Secondary | ICD-10-CM | POA: Diagnosis not present

## 2021-11-11 DIAGNOSIS — C7931 Secondary malignant neoplasm of brain: Secondary | ICD-10-CM | POA: Diagnosis not present

## 2021-11-11 LAB — RAD ONC ARIA SESSION SUMMARY
Course Elapsed Days: 4
Plan Fractions Treated to Date: 2
Plan Prescribed Dose Per Fraction: 5.5 Gy
Plan Total Fractions Prescribed: 5
Plan Total Prescribed Dose: 27.5 Gy
Reference Point Dosage Given to Date: 11 Gy
Reference Point Session Dosage Given: 5.5 Gy
Session Number: 2

## 2021-11-11 NOTE — Progress Notes (Signed)
Nurse monitoring complete status post 2 of 5 SRS treatments. Patient without complaints. Patient denies new or worsening neurologic symptoms. Vitals stable. Instructed patient to avoid strenuous activity for the next 24 hours.  Instructed patient to call [phone] with needs related to treatment after hours or over the weekend. Patient verbalized understanding. Patient ambulated out of clinic unassisted without incident ? ?Vitals:  ? 11/11/21 1148  ?BP: 128/83  ?Pulse: 91  ?Resp: 17  ?Temp: 98.5 ?F (36.9 ?C)  ?SpO2: 100%  ? ? ? ?

## 2021-11-13 ENCOUNTER — Other Ambulatory Visit: Payer: Self-pay

## 2021-11-13 ENCOUNTER — Ambulatory Visit
Admission: RE | Admit: 2021-11-13 | Discharge: 2021-11-13 | Disposition: A | Discharge status: Home / Self Care | Payer: Medicare HMO | Source: Ambulatory Visit | Attending: Radiation Oncology | Admitting: Radiation Oncology

## 2021-11-13 DIAGNOSIS — C3411 Malignant neoplasm of upper lobe, right bronchus or lung: Secondary | ICD-10-CM | POA: Diagnosis not present

## 2021-11-13 DIAGNOSIS — C3490 Malignant neoplasm of unspecified part of unspecified bronchus or lung: Secondary | ICD-10-CM

## 2021-11-13 DIAGNOSIS — Z510 Encounter for antineoplastic radiation therapy: Secondary | ICD-10-CM | POA: Diagnosis not present

## 2021-11-13 DIAGNOSIS — C7931 Secondary malignant neoplasm of brain: Secondary | ICD-10-CM

## 2021-11-13 LAB — RAD ONC ARIA SESSION SUMMARY
Course Elapsed Days: 6
Plan Fractions Treated to Date: 3
Plan Prescribed Dose Per Fraction: 5.5 Gy
Plan Total Fractions Prescribed: 5
Plan Total Prescribed Dose: 27.5 Gy
Reference Point Dosage Given to Date: 16.5 Gy
Reference Point Session Dosage Given: 5.5 Gy
Session Number: 3

## 2021-11-13 NOTE — Progress Notes (Signed)
Patient over to nursing after [facility] Brain 3 of 5 treatments for observation.  Alert and verbally responsive denies headache, blurred vision, and nausea.  Gait is good able to walk out of clinic without assistance.  ?

## 2021-11-13 NOTE — Addendum Note (Signed)
Encounter addended by: Tyler Pita, MD on: 11/13/2021 3:12 PM ? Actions taken: Medication List reviewed, Problem List reviewed, Allergies reviewed, Visit diagnoses modified

## 2021-11-14 ENCOUNTER — Inpatient Hospital Stay: Payer: Medicare HMO

## 2021-11-14 ENCOUNTER — Inpatient Hospital Stay (HOSPITAL_BASED_OUTPATIENT_CLINIC_OR_DEPARTMENT_OTHER): Payer: Medicare HMO | Admitting: Internal Medicine

## 2021-11-14 ENCOUNTER — Inpatient Hospital Stay: Payer: Medicare HMO | Attending: Internal Medicine

## 2021-11-14 VITALS — BP 136/80 | HR 93 | Temp 98.0°F | Resp 19 | Wt 137.4 lb

## 2021-11-14 DIAGNOSIS — F32A Depression, unspecified: Secondary | ICD-10-CM | POA: Diagnosis not present

## 2021-11-14 DIAGNOSIS — Z5112 Encounter for antineoplastic immunotherapy: Secondary | ICD-10-CM | POA: Insufficient documentation

## 2021-11-14 DIAGNOSIS — C3411 Malignant neoplasm of upper lobe, right bronchus or lung: Secondary | ICD-10-CM | POA: Insufficient documentation

## 2021-11-14 DIAGNOSIS — R609 Edema, unspecified: Secondary | ICD-10-CM | POA: Diagnosis not present

## 2021-11-14 DIAGNOSIS — C7931 Secondary malignant neoplasm of brain: Secondary | ICD-10-CM | POA: Insufficient documentation

## 2021-11-14 DIAGNOSIS — Z902 Acquired absence of lung [part of]: Secondary | ICD-10-CM | POA: Diagnosis not present

## 2021-11-14 DIAGNOSIS — Z87891 Personal history of nicotine dependence: Secondary | ICD-10-CM | POA: Diagnosis not present

## 2021-11-14 DIAGNOSIS — Z5111 Encounter for antineoplastic chemotherapy: Secondary | ICD-10-CM | POA: Insufficient documentation

## 2021-11-14 DIAGNOSIS — M47814 Spondylosis without myelopathy or radiculopathy, thoracic region: Secondary | ICD-10-CM | POA: Diagnosis not present

## 2021-11-14 DIAGNOSIS — I6782 Cerebral ischemia: Secondary | ICD-10-CM | POA: Diagnosis not present

## 2021-11-14 DIAGNOSIS — K8020 Calculus of gallbladder without cholecystitis without obstruction: Secondary | ICD-10-CM | POA: Diagnosis not present

## 2021-11-14 DIAGNOSIS — G939 Disorder of brain, unspecified: Secondary | ICD-10-CM | POA: Insufficient documentation

## 2021-11-14 DIAGNOSIS — Z8719 Personal history of other diseases of the digestive system: Secondary | ICD-10-CM | POA: Diagnosis not present

## 2021-11-14 DIAGNOSIS — M47812 Spondylosis without myelopathy or radiculopathy, cervical region: Secondary | ICD-10-CM | POA: Insufficient documentation

## 2021-11-14 DIAGNOSIS — E785 Hyperlipidemia, unspecified: Secondary | ICD-10-CM | POA: Insufficient documentation

## 2021-11-14 DIAGNOSIS — Z7952 Long term (current) use of systemic steroids: Secondary | ICD-10-CM | POA: Insufficient documentation

## 2021-11-14 DIAGNOSIS — F419 Anxiety disorder, unspecified: Secondary | ICD-10-CM | POA: Insufficient documentation

## 2021-11-14 DIAGNOSIS — Z888 Allergy status to other drugs, medicaments and biological substances status: Secondary | ICD-10-CM | POA: Diagnosis not present

## 2021-11-14 DIAGNOSIS — K219 Gastro-esophageal reflux disease without esophagitis: Secondary | ICD-10-CM | POA: Diagnosis not present

## 2021-11-14 DIAGNOSIS — Z79899 Other long term (current) drug therapy: Secondary | ICD-10-CM | POA: Diagnosis not present

## 2021-11-14 DIAGNOSIS — J984 Other disorders of lung: Secondary | ICD-10-CM | POA: Diagnosis not present

## 2021-11-14 LAB — CMP (CANCER CENTER ONLY)
ALT: 18 U/L (ref 0–44)
AST: 30 U/L (ref 15–41)
Albumin: 3.8 g/dL (ref 3.5–5.0)
Alkaline Phosphatase: 69 U/L (ref 38–126)
Anion gap: 8 (ref 5–15)
BUN: 10 mg/dL (ref 8–23)
CO2: 30 mmol/L (ref 22–32)
Calcium: 9.8 mg/dL (ref 8.9–10.3)
Chloride: 104 mmol/L (ref 98–111)
Creatinine: 0.76 mg/dL (ref 0.44–1.00)
GFR, Estimated: >60 mL/min (ref >60)
Glucose, Bld: 123 mg/dL — ABNORMAL HIGH (ref 70–99)
Potassium: 4 mmol/L (ref 3.5–5.1)
Sodium: 142 mmol/L (ref 135–145)
Total Bilirubin: 0.2 mg/dL — ABNORMAL LOW (ref 0.3–1.2)
Total Protein: 7.3 g/dL (ref 6.5–8.1)

## 2021-11-14 LAB — CBC WITH DIFFERENTIAL (CANCER CENTER ONLY)
Abs Immature Granulocytes: 0.05 10*3/uL (ref 0.00–0.07)
Basophils Absolute: 0.1 10*3/uL (ref 0.0–0.1)
Basophils Relative: 1 %
Eosinophils Absolute: 0 10*3/uL (ref 0.0–0.5)
Eosinophils Relative: 0 %
HCT: 35.5 % — ABNORMAL LOW (ref 36.0–46.0)
Hemoglobin: 11.3 g/dL — ABNORMAL LOW (ref 12.0–15.0)
Immature Granulocytes: 1 %
Lymphocytes Relative: 38 %
Lymphs Abs: 1.7 10*3/uL (ref 0.7–4.0)
MCH: 28.7 pg (ref 26.0–34.0)
MCHC: 31.8 g/dL (ref 30.0–36.0)
MCV: 90.1 fL (ref 80.0–100.0)
Monocytes Absolute: 0.6 10*3/uL (ref 0.1–1.0)
Monocytes Relative: 13 %
Neutro Abs: 2.2 10*3/uL (ref 1.7–7.7)
Neutrophils Relative %: 47 %
Platelet Count: 382 10*3/uL (ref 150–400)
RBC: 3.94 MIL/uL (ref 3.87–5.11)
RDW: 13 % (ref 11.5–15.5)
WBC Count: 4.6 10*3/uL (ref 4.0–10.5)
nRBC: 0 % (ref 0.0–0.2)

## 2021-11-14 LAB — TSH: TSH: 0.579 u[IU]/mL (ref 0.350–4.500)

## 2021-11-14 MED ORDER — SODIUM CHLORIDE 0.9 % IV SOLN
200.0000 mg | Freq: Once | INTRAVENOUS | Status: AC
  Administered 2021-11-14: 200 mg via INTRAVENOUS
  Filled 2021-11-14: qty 200

## 2021-11-14 MED ORDER — SODIUM CHLORIDE 0.9 % IV SOLN
800.0000 mg | Freq: Once | INTRAVENOUS | Status: AC
  Administered 2021-11-14: 800 mg via INTRAVENOUS
  Filled 2021-11-14: qty 20

## 2021-11-14 MED ORDER — PROCHLORPERAZINE MALEATE 10 MG PO TABS
10.0000 mg | ORAL_TABLET | Freq: Once | ORAL | Status: AC
  Administered 2021-11-14: 10 mg via ORAL
  Filled 2021-11-14: qty 1

## 2021-11-14 MED ORDER — SODIUM CHLORIDE 0.9 % IV SOLN: Freq: Once | INTRAVENOUS | Status: AC

## 2021-11-14 MED ORDER — CYANOCOBALAMIN 1000 MCG/ML IJ SOLN
1000.0000 ug | Freq: Once | INTRAMUSCULAR | Status: AC
  Administered 2021-11-14: 1000 ug via INTRAMUSCULAR
  Filled 2021-11-14: qty 1

## 2021-11-14 NOTE — Patient Instructions (Signed)
[facility]  Discharge Instructions: Thank you for choosing [facility] to provide your oncology and hematology care.   If you have a lab appointment with the Cancer Center, please go directly to the Cancer Center and check in at the registration area.   Wear comfortable clothing and clothing appropriate for easy access to any Portacath or PICC line.   We strive to give you quality time with your provider. You may need to reschedule your appointment if you arrive late (15 or more minutes).  Arriving late affects you and other patients whose appointments are after yours.  Also, if you miss three or more appointments without notifying the office, you may be dismissed from the clinic at the provider's discretion.      For prescription refill requests, have your pharmacy contact our office and allow 72 hours for refills to be completed.    Today you received the following chemotherapy and/or immunotherapy agents Pembrolizumab (Keytruda) and Pemetrexed (Alimta)      To help prevent nausea and vomiting after your treatment, we encourage you to take your nausea medication as directed.  BELOW ARE SYMPTOMS THAT SHOULD BE REPORTED IMMEDIATELY: *FEVER GREATER THAN 100.4 F (38 C) OR HIGHER *CHILLS OR SWEATING *NAUSEA AND VOMITING THAT IS NOT CONTROLLED WITH YOUR NAUSEA MEDICATION *UNUSUAL SHORTNESS OF BREATH *UNUSUAL BRUISING OR BLEEDING *URINARY PROBLEMS (pain or burning when urinating, or frequent urination) *BOWEL PROBLEMS (unusual diarrhea, constipation, pain near the anus) TENDERNESS IN MOUTH AND THROAT WITH OR WITHOUT PRESENCE OF ULCERS (sore throat, sores in mouth, or a toothache) UNUSUAL RASH, SWELLING OR PAIN  UNUSUAL VAGINAL DISCHARGE OR ITCHING   Items with * indicate a potential emergency and should be followed up as soon as possible or go to the Emergency Department if any problems should occur.  Please show the CHEMOTHERAPY ALERT CARD or  IMMUNOTHERAPY ALERT CARD at check-in to the Emergency Department and triage nurse.  Should you have questions after your visit or need to cancel or reschedule your appointment, please contact [facility]  Dept: [phone]  and follow the prompts.  Office hours are 8:00 a.m. to 4:30 p.m. Monday - Friday. Please note that voicemails left after 4:00 p.m. may not be returned until the following business day.  We are closed weekends and major holidays. You have access to a nurse at all times for urgent questions. Please call the main number to the clinic Dept: [phone] and follow the prompts.   For any non-urgent questions, you may also contact your provider using MyChart. We now offer e-Visits for anyone 18 and older to request care online for non-urgent symptoms. For details visit [email or web address].   Also download the MyChart app! Go to the app store, search "MyChart", open the app, select [facility], and log in with your MyChart username and password.  Due to Covid, a mask is required upon entering the hospital/clinic. If you do not have a mask, one will be given to you upon arrival. For doctor visits, patients may have 1 support person aged 18 or older with them. For treatment visits, patients cannot have anyone with them due to current Covid guidelines and our immunocompromised population.   

## 2021-11-14 NOTE — Progress Notes (Signed)
[place] [phone]   [phone]  OFFICE PROGRESS NOTE  [name], MD [place] [address]  DIAGNOSIS: Stage IV non-small cell lung cancer (T, N0, M1C) adenocarcinoma.  The patient presented with a and solitary brain metastasis.  The patient was diagnosed in February 2022.  Biomarker Findings Microsatellite status - MS-Stable Tumor Mutational Burden - 8 Muts/Mb Genomic Findings For a complete list of the genes assayed, please refer to the Appendix. KRAS G12V KEAP1 S224F TP53 P151T 7 Disease relevant genes with no reportable alterations: ALK, BRAF, EGFR, ERBB2, MET, RET, ROS1  PDL1 Expression: 50%    PRIOR THERAPY: 1) SRS to the metastatic brain lesion on 08/23/2020 under the care of Dr. Tammi Klippel and craniotomy under the care of Dr. Zada Finders scheduled for 08/24/2020. 2) S/p robotic assisted right upper lobectomy with en bloc wedge resection of the right middle lobe and lymph node dissection under the care of Dr. Roxan Hockey on September 24, 2020. 3) status post SRS to a new subcentimeter brain lesions under the care of Dr. Lisbeth Renshaw. 4) SRS to brain metastasis.   CURRENT THERAPY: Systemic chemotherapy with carboplatin for AUC of 5, Alimta 500 Mg/M2 and Keytruda 200 mg IV every 3 weeks.  First dose Nov 07, 2020.  Status post 16 cycles.  Starting from cycle #5 the patient will be on maintenance treatment with Alimta and Keytruda every 3 weeks.   INTERVAL HISTORY: Hannah [name] 71 y.o. female returns to the clinic today for follow-up visit accompanied by her daughter.  The patient is feeling fine today with no concerning complaints except for occasional pain and popping in her left arm that happened few times recently.  She denied having any current chest pain, shortness of breath, cough or hemoptysis.  She denied having any nausea, vomiting, diarrhea or constipation.  She has no headache or visual changes.  She is followed by  Dr. Mickeal Skinner and also neurosurgery at [facility] for her brain metastasis.  The patient is here today for evaluation before starting cycle #17 of her treatment with Alimta and Keytruda.  MEDICAL HISTORY: Past Medical History:  Diagnosis Date   Allergy    Anemia    Anxiety    Arthritis    Carpal tunnel syndrome    Constipation    senna C stool softeners help    Depression    Diverticulosis    Dyslipidemia    External hemorrhoids    GERD (gastroesophageal reflux disease)    Heart murmur    mild-moderate AR   Hiatal hernia    Hyperlipidemia    on meds    Hypertension    Internal hemorrhoids    lung ca with brain mets 07/2020   Osteoarthritis    Pre-diabetes    PVD (peripheral vascular disease) (HCC)    moderate carotid disease   RBBB    Smoker    Vocal cord polyps     ALLERGIES:  is allergic to amlodipine, chantix [varenicline tartrate], clarithromycin, lisinopril, simvastatin, wellbutrin [bupropion hcl], lipitor [atorvastatin], and sertraline.  MEDICATIONS:  Current Outpatient Medications  Medication Sig Dispense Refill   acetaminophen (TYLENOL) 325 MG tablet Take 325 mg by mouth every 6 (six) hours as needed for moderate pain.     acetaminophen (TYLENOL) 500 MG tablet Take 2 tablets (1,000 mg total) by mouth every 6 (six) hours. (Patient not taking: Reported on 10/27/2021) 30 tablet 0   ALPRAZolam (XANAX XR) 0.5 MG 24  hr tablet Take one tablet (0.5 mg total) by mouth every night at bedtime. 30 tablet 5   ALPRAZolam (XANAX) 0.5 MG tablet Take one tablet (0.5 mg total) by mouth qam and may take one more tablet (0.5 mg total) qd prn for anxiety. (Patient taking differently: Take 0.5 mg by mouth at bedtime as needed for anxiety. Take one tablet (0.5 mg XR total) by mouth qam and may take one more tablet (0.5 mg total) qd prn for anxiety.) 120 tablet 5   Ascorbic Acid (VITAMIN C) 1000 MG tablet Take 1,000 mg by mouth daily.     aspirin 81 MG tablet Take 1 tablet (81 mg total)  by mouth daily. Restart on 08/31/20 30 tablet    bisacodyl (DULCOLAX) 5 MG EC tablet Take 5 mg by mouth daily as needed for moderate constipation.     carboxymethylcellulose (REFRESH PLUS) 0.5 % SOLN Place 1-2 drops into both eyes 3 (three) times daily as needed (dry eyes).     Cholecalciferol (D3 PO) Take 1 capsule by mouth daily.     citalopram (CELEXA) 10 MG tablet Take 1 tablet (10 mg total) by mouth daily. 90 tablet 1   dexamethasone (DECADRON) 1 MG tablet Take 1 tablet (1 mg total) by mouth daily. (Patient not taking: Reported on 10/24/2021) 60 tablet 1   fluticasone (FLONASE) 50 MCG/ACT nasal spray PLACE 2 SPRAYS INTO BOTH NOSTRILS DAILY. (Patient taking differently: Place 2 sprays into both nostrils daily as needed for allergies.) 48 g 1   folic acid (FOLVITE) 1 MG tablet Take 1 tablet (1 mg total) by mouth daily. 30 tablet 2   Lacosamide 100 MG TABS Take 1 tablet by mouth twice daily 60 tablet 0   levETIRAcetam (KEPPRA) 100 MG/ML solution TAKE 15 MLS BY MOUTH 2 TIMES DAILY 473 mL 0   loratadine (CLARITIN) 10 MG tablet Take 10 mg by mouth daily as needed for allergies.     magnesium hydroxide (MILK OF MAGNESIA) 800 MG/5ML suspension Take 30 mLs by mouth daily as needed for constipation.     Menthol, Topical Analgesic, (BIOFREEZE EX) Apply 1 application topically as needed (pain).     mirtazapine (REMERON) 45 MG tablet Take 1 tablet (45 mg total) by mouth at bedtime. 30 tablet 3   olopatadine (PATANOL) 0.1 % ophthalmic solution Place 1 drop into both eyes 2 (two) times daily as needed for allergies.     omeprazole (PRILOSEC) 40 MG capsule TAKE 1 CAPSULE TWICE DAILY BEFORE MEALS (Patient taking differently: Take 40 mg by mouth daily.) 180 capsule 0   oxyCODONE (OXY IR/ROXICODONE) 5 MG immediate release tablet Take 5 mg by mouth every 8 (eight) hours as needed.     polyethylene glycol (MIRALAX / GLYCOLAX) 17 g packet Take 17 g by mouth 2 (two) times daily. 72 each 0   pravastatin (PRAVACHOL) 40  MG tablet TAKE 1 TABLET (40 MG TOTAL) BY MOUTH EVERY EVENING. (Patient taking differently: Take 40 mg by mouth daily.) 90 tablet 0   Probiotic Product (PROBIOTIC DAILY PO) Take 1 capsule by mouth daily.     prochlorperazine (COMPAZINE) 10 MG tablet TAKE 1 TABLET BY MOUTH EVERY 6 HOURS AS NEEDED FOR NAUSEA OR VOMITING 30 tablet 0   simethicone (MYLICON) 80 MG chewable tablet Chew 1 tablet (80 mg total) by mouth 4 (four) times daily as needed for flatulence (Bloating). 30 tablet 0   No current facility-administered medications for this visit.    SURGICAL HISTORY:  Past Surgical History:  Procedure Laterality Date   ABDOMINAL HYSTERECTOMY  9470   APPLICATION OF CRANIAL NAVIGATION N/A 08/24/2020   Procedure: APPLICATION OF CRANIAL NAVIGATION;  Surgeon: Judith Part, MD;  Location: [facility];  Service: Neurosurgery;  Laterality: N/A;   COLONOSCOPY     COLONOSCOPY W/ POLYPECTOMY  2009   CRANIOTOMY Left 08/24/2020   Procedure: Left Craniotomy for Tumor Resection with Brainlab;  Surgeon: Judith Part, MD;  Location: [facility];  Service: Neurosurgery;  Laterality: Left;   HEMORRHOID SURGERY     INTERCOSTAL NERVE BLOCK Right 09/24/2020   Procedure: INTERCOSTAL NERVE BLOCK;  Surgeon: Melrose Nakayama, MD;  Location: [facility];  Service: Thoracic;  Laterality: Right;   JOINT REPLACEMENT     LYMPH NODE DISSECTION Right 09/24/2020   Procedure: LYMPH NODE DISSECTION;  Surgeon: Melrose Nakayama, MD;  Location: [facility];  Service: Thoracic;  Laterality: Right;   POLYPECTOMY     right total knee arthroplasty     Dr. Emeterio Reeve 06-04-18   [place]  08/16/2009   TOTAL KNEE ARTHROPLASTY Left 02/13/2014   Procedure: TOTAL KNEE ARTHROPLASTY;  Surgeon: Alta Corning, MD;  Location: [facility];  Service: Orthopedics;  Laterality: Left;   TOTAL KNEE ARTHROPLASTY Right 06/04/2018   Procedure: RIGHT TOTAL KNEE ARTHROPLASTY;  Surgeon: Dorna Leitz, MD;  Location: [facility];  Service: Orthopedics;  Laterality: Right;   Adductor Block   TUBAL LIGATION      REVIEW OF SYSTEMS:  A comprehensive review of systems was negative except for: Constitutional: positive for fatigue Musculoskeletal: positive for arthralgias   PHYSICAL EXAMINATION: General appearance: alert, cooperative, fatigued, and no distress Head: Normocephalic, without obvious abnormality, atraumatic Neck: no adenopathy, no JVD, supple, symmetrical, trachea midline, and thyroid not enlarged, symmetric, no tenderness/mass/nodules Lymph nodes: Cervical, supraclavicular, and axillary nodes normal. Resp: clear to auscultation bilaterally Back: symmetric, no curvature. ROM normal. No CVA tenderness. Cardio: regular rate and rhythm, S1, S2 normal, no murmur, click, rub or gallop GI: soft, non-tender; bowel sounds normal; no masses,  no organomegaly Extremities: extremities normal, atraumatic, no cyanosis or edema  ECOG PERFORMANCE STATUS: 1 - Symptomatic but completely ambulatory  Blood pressure 136/80, pulse 93, temperature 98 F (36.7 C), temperature source Oral, resp. rate 19, weight 137 lb 7 oz (62.3 kg), SpO2 100 %.  LABORATORY DATA: Lab Results  Component Value Date   WBC 4.6 11/14/2021   HGB 11.3 (L) 11/14/2021   HCT 35.5 (L) 11/14/2021   MCV 90.1 11/14/2021   PLT 382 11/14/2021      Chemistry      Component Value Date/Time   NA 137 10/27/2021 1030   K 4.2 10/27/2021 1030   CL 104 10/27/2021 1030   CO2 27 10/27/2021 1030   BUN 16 10/27/2021 1030   CREATININE 0.78 10/27/2021 1030   CREATININE 0.96 10/24/2021 1042   CREATININE 0.79 03/14/2020 1016      Component Value Date/Time   CALCIUM 9.4 10/27/2021 1030   ALKPHOS 50 10/27/2021 1030   AST 26 10/27/2021 1030   AST 21 10/24/2021 1042   ALT 30 10/27/2021 1030   ALT 31 10/24/2021 1042   BILITOT 0.8 10/27/2021 1030   BILITOT 0.2 (L) 10/24/2021 1042       RADIOGRAPHIC STUDIES: CT Head Wo Contrast  Result Date: 10/27/2021 CLINICAL DATA:  Delirium.  Headache.  Brain  metastases. EXAM: CT HEAD WITHOUT CONTRAST TECHNIQUE: Contiguous axial images were obtained from the base of the skull through the vertex without intravenous contrast. RADIATION  DOSE REDUCTION: This exam was performed according to the departmental dose-optimization program which includes automated exposure control, adjustment of the mA and/or kV according to patient size and/or use of iterative reconstruction technique. COMPARISON:  10/14/2021 FINDINGS: Brain: Asymmetric low-attenuation edema is again noted involving the left posterior parietal and occipital lobe corresponding to known treated brain metastases. Since 10/14/2021 there has been interval resolution of pneumocephalus within the left posterior parietal lobe. The extent of edema within this area is stable to improved in the interval. Small ossific density within the left parieto-occipital lobe measuring 2 mm is favored to represent a small bone fragment status post burr hole placement. Subtle asymmetric area of low attenuation within the left frontal lobe corresponding to known brain lesion is also unchanged in the interval. No signs of acute intracranial hemorrhage or infarct. No signs of hydrocephalus. No significant midline shift identified. Vascular: No hyperdense vessel or unexpected calcification. Skull: Status post left frontal craniotomy. Left posterior parieto-occipital bone burr-hole defect identified. Sinuses/Orbits: No acute finding. Other: None IMPRESSION: 1. No acute intracranial abnormalities. 2. Stable to improved appearance of left parieto-occipital lobe vasogenic edema corresponding to known treated brain metastases. 3. Small area of low attenuation edema within the left frontal lobe corresponding to known frontal lobe lesion is also unchanged. 4. Status post left frontal craniotomy and left posterior parietal bone burr hole defect. Electronically Signed   By: Kerby Moors M.D.   On: 10/27/2021 11:59   CT Chest W Contrast  Result  Date: 10/23/2021 CLINICAL DATA:  Metastatic non-small cell lung cancer restaging. * Tracking Code: BO * EXAM: CT CHEST, ABDOMEN, AND PELVIS WITH CONTRAST TECHNIQUE: Multidetector CT imaging of the chest, abdomen and pelvis was performed following the standard protocol during bolus administration of intravenous contrast. RADIATION DOSE REDUCTION: This exam was performed according to the departmental dose-optimization program which includes automated exposure control, adjustment of the mA and/or kV according to patient size and/or use of iterative reconstruction technique. CONTRAST:  166mL ISOVUE-300 IOPAMIDOL (ISOVUE-300) INJECTION 61% COMPARISON:  Multiple exams, including 08/08/2021 FINDINGS: CT CHEST FINDINGS Cardiovascular: Coronary, aortic arch, and branch vessel atherosclerotic vascular disease. Borderline cardiomegaly. Mild mitral valve calcification. Mediastinum/Nodes: Unremarkable Lungs/Pleura: Right upper lobectomy. Stable scarring in the right middle lobe. 2 by 3 mm left upper lobe nodule on image 75 series 4, no change from earliest available comparison of 08/02/2020. Also on this same image there is a 3 mm left lower lobe nodule unchanged from 08/02/2020. Previous right pleural effusion has resolved. Musculoskeletal: Thoracic spondylosis. CT ABDOMEN PELVIS FINDINGS Hepatobiliary: Stable cholelithiasis. No biliary dilatation. No focal liver lesion identified. Pancreas: Unremarkable Spleen: Unremarkable Adrenals/Urinary Tract: Several small calcifications along the ureters appear to likely be outside of the ureters, there is no hydroureter. The kidneys and urinary bladder appear unremarkable. Stomach/Bowel: Mild swirling of the right lower quadrant mesentery on image 86 of series 2 without dilated bowel or substantial complicating feature to indicate overt volvulus. Prominent stool throughout the colon favors constipation. Normal appendix. Vascular/Lymphatic: Atherosclerosis is present, including  aortoiliac atherosclerotic disease. No pathologic adenopathy identified. Reproductive: Uterus absent.  Adnexa unremarkable. Other: No supplemental non-categorized findings. Musculoskeletal: Distal right iliopsoas lipoma. Degenerative spurring in both hips. Interbody and posterior element fusion at L3-L4-L5, with bilateral foraminal impingement at L5-S1 due to facet and intervertebral spurring. IMPRESSION: 1. No findings of active malignancy in the chest, abdomen, or pelvis. Prior right upper lobectomy. 2. Two tiny nodules in the left lung are stable from earliest available comparison of 08/02/2020 and probably  benign although merit surveillance given the clinical context. 3. Mild swirling of mesenteric vessels in the right lower quadrant but without dilated bowel or other complicating feature to indicate clinically significant volvulus. 4. Other imaging findings of potential clinical significance: Aortic Atherosclerosis (ICD10-I70.0). Coronary and systemic atherosclerosis. Borderline cardiomegaly. Mild mitral valve calcification. Prominent stool throughout the colon favors constipation. Bilateral foraminal impingement at L5-S1. Electronically Signed   By: Van Clines M.D.   On: 10/23/2021 09:49   MR Brain W Wo Contrast  Result Date: 11/06/2021 CLINICAL DATA:  Provided history: Metastasis to brain. Brain metastases, assess treatment response. Postop SRS treatment planning. Patient had LITT procedure on 04/17. EXAM: MRI HEAD WITHOUT AND WITH CONTRAST TECHNIQUE: Multiplanar, multiecho pulse sequences of the brain and surrounding structures were obtained without and with intravenous contrast. CONTRAST:  77mL MULTIHANCE GADOBENATE DIMEGLUMINE 529 MG/ML IV SOLN COMPARISON:  Prior head CT examinations 10/27/2021 and earlier. Prior brain MRI examinations 10/15/2021 and earlier. FINDINGS: Mild intermittent motion degradation. Brain: Mild generalized cerebral atrophy. Since the prior brain MRI of 09/20/2021, there  has been interval laser interstitial thermal therapy (LITT) of a metastasis within the left parietooccipital lobes. The lesion has increased in size, now measuring 2.4 x 2.3 x 2.6 cm, previously 1.7 x 2.1 x 2.0 cm (remeasured on prior). Additionally, there are progressive cystic/necrotic changes centrally. Restricted diffusion and non-acute blood products within the cystic/necrotic component. As before, the lesion demonstrates peripheral enhancement. Moderate surrounding edema is similar. Enhancement at site of the lesion within the left superior frontal lobe has somewhat changed in configuration, and there has been slight progressive collapse of the resection cavity at this site. However, enhancement at this site spans 2 cm in greatest dimension, not significantly changed from the prior MRI. Surrounding T2 FLAIR hyperintense signal abnormality is stable. Chronic blood products associated with this lesion. No new intracranial metastases are identified. Background mild multifocal T2 FLAIR hyperintense signal abnormality within the cerebral white matter and pons, nonspecific but compatible chronic small vessel disease. There is no acute infarct. No extra-axial fluid collection. No midline shift. Vascular: Maintained flow voids within the proximal large arterial vessels. Skull and upper cervical spine: No focal suspicious marrow lesion. Left parietal cranioplasty. Defect within the left parietooccipital calvarium from prior LITT. Incompletely assessed cervical spondylosis. Sinuses/Orbits: No mass or acute finding within the imaged orbits. Minimal mucosal thickening within the frontal sinuses. Mild-to-moderate mucosal thickening within the bilateral ethmoid sinuses. Mild mucosal thickening within the right sphenoid sinus. Multiple mucous retention cysts, and background mild mucosal thickening, within the left sphenoid sinus. Mild mucosal thickening within the bilateral maxillary sinuses. Other: Small right mastoid  effusion. IMPRESSION: Interval laser interstitial thermal therapy (LITT) for the left parietooccipital lobe metastasis. The lesion has increased in size, and there are progressive cystic/necrotic changes centrally. Moderate surrounding edema is stable. These findings are not unexpected approximately one month following therapy. Continued MR imaging follow-up recommended. Enhancement at site of a lesion within the left superior frontal lobe has slightly changed in configuration. However, the lesion has not significantly changed in size, again spanning 2 cm. Surrounding T2 FLAIR hyperintense signal abnormality is stable. No new intracranial metastasis is identified. Paranasal sinus disease, as described. Small right mastoid effusion. Electronically Signed   By: Kellie Simmering D.O.   On: 11/06/2021 14:40   CT Abdomen Pelvis W Contrast  Result Date: 10/23/2021 CLINICAL DATA:  Metastatic non-small cell lung cancer restaging. * Tracking Code: BO * EXAM: CT CHEST, ABDOMEN, AND PELVIS WITH CONTRAST TECHNIQUE:  Multidetector CT imaging of the chest, abdomen and pelvis was performed following the standard protocol during bolus administration of intravenous contrast. RADIATION DOSE REDUCTION: This exam was performed according to the departmental dose-optimization program which includes automated exposure control, adjustment of the mA and/or kV according to patient size and/or use of iterative reconstruction technique. CONTRAST:  145mL ISOVUE-300 IOPAMIDOL (ISOVUE-300) INJECTION 61% COMPARISON:  Multiple exams, including 08/08/2021 FINDINGS: CT CHEST FINDINGS Cardiovascular: Coronary, aortic arch, and branch vessel atherosclerotic vascular disease. Borderline cardiomegaly. Mild mitral valve calcification. Mediastinum/Nodes: Unremarkable Lungs/Pleura: Right upper lobectomy. Stable scarring in the right middle lobe. 2 by 3 mm left upper lobe nodule on image 75 series 4, no change from earliest available comparison of 08/02/2020.  Also on this same image there is a 3 mm left lower lobe nodule unchanged from 08/02/2020. Previous right pleural effusion has resolved. Musculoskeletal: Thoracic spondylosis. CT ABDOMEN PELVIS FINDINGS Hepatobiliary: Stable cholelithiasis. No biliary dilatation. No focal liver lesion identified. Pancreas: Unremarkable Spleen: Unremarkable Adrenals/Urinary Tract: Several small calcifications along the ureters appear to likely be outside of the ureters, there is no hydroureter. The kidneys and urinary bladder appear unremarkable. Stomach/Bowel: Mild swirling of the right lower quadrant mesentery on image 86 of series 2 without dilated bowel or substantial complicating feature to indicate overt volvulus. Prominent stool throughout the colon favors constipation. Normal appendix. Vascular/Lymphatic: Atherosclerosis is present, including aortoiliac atherosclerotic disease. No pathologic adenopathy identified. Reproductive: Uterus absent.  Adnexa unremarkable. Other: No supplemental non-categorized findings. Musculoskeletal: Distal right iliopsoas lipoma. Degenerative spurring in both hips. Interbody and posterior element fusion at L3-L4-L5, with bilateral foraminal impingement at L5-S1 due to facet and intervertebral spurring. IMPRESSION: 1. No findings of active malignancy in the chest, abdomen, or pelvis. Prior right upper lobectomy. 2. Two tiny nodules in the left lung are stable from earliest available comparison of 08/02/2020 and probably benign although merit surveillance given the clinical context. 3. Mild swirling of mesenteric vessels in the right lower quadrant but without dilated bowel or other complicating feature to indicate clinically significant volvulus. 4. Other imaging findings of potential clinical significance: Aortic Atherosclerosis (ICD10-I70.0). Coronary and systemic atherosclerosis. Borderline cardiomegaly. Mild mitral valve calcification. Prominent stool throughout the colon favors constipation.  Bilateral foraminal impingement at L5-S1. Electronically Signed   By: Van Clines M.D.   On: 10/23/2021 09:49   DG Chest Portable 1 View  Result Date: 10/27/2021 CLINICAL DATA:  Altered mental status. EXAM: PORTABLE CHEST 1 VIEW COMPARISON:  08/30/2021 FINDINGS: The cardiomediastinal silhouette is unremarkable. Mild elevation of the RIGHT hemidiaphragm again noted. There is no evidence of focal airspace disease, pulmonary edema, suspicious pulmonary nodule/mass, pleural effusion, or pneumothorax. No acute bony abnormalities are identified. IMPRESSION: No active disease. Electronically Signed   By: Margarette Canada M.D.   On: 10/27/2021 11:58     ASSESSMENT AND PLAN: This is a very pleasant 71 years old African-American female diagnosed with a stage IV (T3, N0, M1b) non-small cell lung cancer, adenocarcinoma presented with right upper lobe lung mass with solitary brain metastasis diagnosed in February 2022 status post SRS to the brain lesion followed by craniotomy and resection. The patient also has a solitary lung mass. S/p robotic assisted right upper lobectomy with en bloc wedge resection of the right middle lobe and lymph node dissection under the care of Dr. Roxan Hockey on September 24, 2020. She also underwent SRS treatment to a new subcentimeter brain lesions under the care of Dr. Lisbeth Renshaw She is currently undergoing systemic chemotherapy with carboplatin for AUC  of 5, Alimta 500 Mg/M2 and Keytruda 200 Mg IV every 3 weeks status post 16 cycles.  Starting from cycle #5 the patient will be on maintenance treatment with Alimta and Keytruda every 3 weeks. The patient has been tolerating her maintenance treatment with Alimta and Keytruda fairly well with no concerning adverse effects. I recommended for her to proceed with cycle #17 today as planned. The patient will come back for follow-up visit in 3 weeks for evaluation before the next cycle of her treatment. For the brain metastasis, she is followed by  Dr. Mickeal Skinner and Dr. Lisbeth Renshaw and scheduled to have MRI soon. She was advised to call immediately if she has any other concerning symptoms in the interval. The patient voices understanding of current disease status and treatment options and is in agreement with the current care plan.  All questions were answered. The patient knows to call the clinic with any problems, questions or concerns. We can certainly see the patient much sooner if necessary.  Disclaimer: This note was dictated with voice recognition software. Similar sounding words can inadvertently be transcribed and may not be corrected upon review.

## 2021-11-15 ENCOUNTER — Ambulatory Visit
Admission: RE | Admit: 2021-11-15 | Discharge: 2021-11-15 | Disposition: A | Discharge status: Home / Self Care | Payer: Medicare HMO | Source: Ambulatory Visit | Attending: Radiation Oncology | Admitting: Radiation Oncology

## 2021-11-15 ENCOUNTER — Other Ambulatory Visit: Payer: Self-pay

## 2021-11-15 VITALS — BP 158/85 | HR 86 | Temp 97.4°F | Resp 20

## 2021-11-15 DIAGNOSIS — C7931 Secondary malignant neoplasm of brain: Secondary | ICD-10-CM | POA: Diagnosis not present

## 2021-11-15 DIAGNOSIS — C3490 Malignant neoplasm of unspecified part of unspecified bronchus or lung: Secondary | ICD-10-CM

## 2021-11-15 DIAGNOSIS — C3411 Malignant neoplasm of upper lobe, right bronchus or lung: Secondary | ICD-10-CM | POA: Diagnosis not present

## 2021-11-15 DIAGNOSIS — Z510 Encounter for antineoplastic radiation therapy: Secondary | ICD-10-CM | POA: Diagnosis not present

## 2021-11-15 LAB — RAD ONC ARIA SESSION SUMMARY
Course Elapsed Days: 8
Plan Fractions Treated to Date: 4
Plan Prescribed Dose Per Fraction: 5.5 Gy
Plan Total Fractions Prescribed: 5
Plan Total Prescribed Dose: 27.5 Gy
Reference Point Dosage Given to Date: 22 Gy
Reference Point Session Dosage Given: 5.5 Gy
Session Number: 4

## 2021-11-15 NOTE — Progress Notes (Signed)
Nurse monitoring complete status post 4 of 5 SRS treatments. Patient without complaints. Patient denies new or worsening neurologic symptoms. Vitals stable. Instructed patient to avoid strenuous activity for the next 24 hours.  Instructed patient to call [phone] with needs related to treatment after hours or over the weekend. Patient verbalized understanding and agreement. Ambulated out of clinic unassisted with husband to personal vehicle without incident  Vitals:   11/15/21 1140  BP: (!) 158/85  Pulse: 86  Resp: 20  Temp: (!) 97.4 F (36.3 C)  SpO2: 100%

## 2021-11-18 ENCOUNTER — Other Ambulatory Visit: Payer: Self-pay

## 2021-11-18 ENCOUNTER — Encounter: Payer: Self-pay | Admitting: Radiation Oncology

## 2021-11-18 ENCOUNTER — Ambulatory Visit
Admission: RE | Admit: 2021-11-18 | Discharge: 2021-11-18 | Disposition: A | Discharge status: Home / Self Care | Payer: Medicare HMO | Source: Ambulatory Visit | Attending: Radiation Oncology | Admitting: Radiation Oncology

## 2021-11-18 DIAGNOSIS — C7931 Secondary malignant neoplasm of brain: Secondary | ICD-10-CM | POA: Diagnosis not present

## 2021-11-18 DIAGNOSIS — Z510 Encounter for antineoplastic radiation therapy: Secondary | ICD-10-CM | POA: Diagnosis not present

## 2021-11-18 DIAGNOSIS — C3490 Malignant neoplasm of unspecified part of unspecified bronchus or lung: Secondary | ICD-10-CM | POA: Diagnosis not present

## 2021-11-18 DIAGNOSIS — C3411 Malignant neoplasm of upper lobe, right bronchus or lung: Secondary | ICD-10-CM | POA: Diagnosis not present

## 2021-11-18 LAB — RAD ONC ARIA SESSION SUMMARY
Course Elapsed Days: 11
Plan Fractions Treated to Date: 5
Plan Prescribed Dose Per Fraction: 5.5 Gy
Plan Total Fractions Prescribed: 5
Plan Total Prescribed Dose: 27.5 Gy
Reference Point Dosage Given to Date: 27.5 Gy
Reference Point Session Dosage Given: 5.5 Gy
Session Number: 5

## 2021-11-18 NOTE — Addendum Note (Signed)
Encounter addended by: Tyler Pita, MD on: 11/18/2021 4:43 PM  Actions taken: Medication List reviewed, Problem List reviewed, Allergies reviewed

## 2021-11-18 NOTE — Progress Notes (Signed)
Patient to nursing for 15 minute observation, SRS Brain  5 of 5 fx completed.  Alert and verbally responsive denies nausea, blurred vision, and had mild headache but states it could be from mask.  Ambulates independently without difficulty or assist.  Vitals: Temp 97.2, pulse 97, respirations 18, BP 124/94, O2 100%.

## 2021-11-21 ENCOUNTER — Other Ambulatory Visit: Payer: Self-pay | Admitting: Internal Medicine

## 2021-11-22 DIAGNOSIS — C7931 Secondary malignant neoplasm of brain: Secondary | ICD-10-CM | POA: Diagnosis not present

## 2021-11-22 DIAGNOSIS — C801 Malignant (primary) neoplasm, unspecified: Secondary | ICD-10-CM | POA: Diagnosis not present

## 2021-11-28 ENCOUNTER — Other Ambulatory Visit: Payer: Self-pay | Admitting: Internal Medicine

## 2021-11-29 ENCOUNTER — Telehealth: Payer: Self-pay | Admitting: Internal Medicine

## 2021-11-29 ENCOUNTER — Encounter: Payer: Self-pay | Admitting: Internal Medicine

## 2021-11-29 NOTE — Telephone Encounter (Signed)
Called patient regarding upcoming appointments, left a voicemail. 

## 2021-11-30 NOTE — Progress Notes (Signed)
[facility] OFFICE PROGRESS NOTE  [name], MD [place] [address]  DIAGNOSIS:  Stage IV non-small cell lung cancer (T3, N0, M1C) adenocarcinoma.  The patient presented with a right upper lobe lung mass and solitary brain metastasis.  The patient was diagnosed in February 2022.   Biomarker Findings Microsatellite status - MS-Stable Tumor Mutational Burden - 8 Muts/Mb Genomic Findings For a complete list of the genes assayed, please refer to the Appendix. KRAS G12V KEAP1 S224F TP53 P151T 7 Disease relevant genes with no reportable alterations: ALK, BRAF, EGFR, ERBB2, MET, RET, ROS1   PDL1 Expression: 90%  PRIOR THERAPY:  1) SRS to the metastatic brain lesion on 08/23/2020 under the care of Dr. Tammi Klippel and craniotomy under the care of Dr. Zada Finders on 08/24/2020. 2) S/p robotic assisted right upper lobectomy with en bloc wedge resection of the right middle lobe and lymph node dissection under the care of Dr. Roxan Hockey on September 24, 2020 3) SRS to the two new subcentimeter metastases under the care of Dr. Tammi Klippel on 11/29/20.  4) SRS to brain metastasis, last dose on 11/18/21  CURRENT THERAPY: Systemic chemotherapy with carboplatin for AUC of 5, Alimta 500 Mg/M2 and Keytruda 200 mg IV every 3 weeks.  First dose Nov 08, 2020. Status post 17 cycles.  Starting from cycle #5, the patient started maintenance Alimta and Keytruda.  INTERVAL HISTORY: Hannah [name] 71 y.o. female returns to the clinic today for a follow-up visit.  The patient is accompanied by her sister in-law. The patient is feeling well today without any concerning complaints except for some fatigue. She recently underwent additional SRS for metastatic brain lesions. She is expected to have her 1 month telephone follow up on 12/19/21. Otherwise, she is tolerating her treatment with Keytruda and Alimta well without any concerning adverse side effects. She mentions that after infusions, she  has some mild superficial erythema of the left breast without any underlying lumps, swelling, heat, itching, nipple discharge, or itching. Denies any fever, chills, night sweats, or unexplained weight loss.  Denies any shortness of breath. Denies cough. Denies any chest discomfort or hemoptysis.  Denies any nausea, vomiting, diarrhea, or constipation.  She is here today for evaluation and repeat blood work before starting cycle #18  MEDICAL HISTORY: Past Medical History:  Diagnosis Date   Allergy    Anemia    Anxiety    Arthritis    Carpal tunnel syndrome    Constipation    senna C stool softeners help    Depression    Diverticulosis    Dyslipidemia    External hemorrhoids    GERD (gastroesophageal reflux disease)    Heart murmur    mild-moderate AR   Hiatal hernia    Hyperlipidemia    on meds    Hypertension    Internal hemorrhoids    lung ca with brain mets 07/2020   Osteoarthritis    Pre-diabetes    PVD (peripheral vascular disease) (HCC)    moderate carotid disease   RBBB    Smoker    Vocal cord polyps     ALLERGIES:  is allergic to amlodipine, chantix [varenicline tartrate], clarithromycin, lisinopril, simvastatin, wellbutrin [bupropion hcl], lipitor [atorvastatin], and sertraline.  MEDICATIONS:  Current Outpatient Medications  Medication Sig Dispense Refill   acetaminophen (TYLENOL) 325 MG tablet Take 325 mg by mouth every 6 (six) hours as needed for moderate pain.     ALPRAZolam (XANAX XR) 0.5 MG 24  hr tablet Take one tablet (0.5 mg total) by mouth every night at bedtime. 30 tablet 5   ALPRAZolam (XANAX) 0.5 MG tablet Take one tablet (0.5 mg total) by mouth qam and may take one more tablet (0.5 mg total) qd prn for anxiety. (Patient taking differently: Take 0.5 mg by mouth at bedtime as needed for anxiety. Take one tablet (0.5 mg XR total) by mouth qam and may take one more tablet (0.5 mg total) qd prn for anxiety.) 120 tablet 5   Ascorbic Acid (VITAMIN C) 1000 MG  tablet Take 1,000 mg by mouth daily.     aspirin 81 MG tablet Take 1 tablet (81 mg total) by mouth daily. Restart on 08/31/20 30 tablet    bisacodyl (DULCOLAX) 5 MG EC tablet Take 5 mg by mouth daily as needed for moderate constipation.     carboxymethylcellulose (REFRESH PLUS) 0.5 % SOLN Place 1-2 drops into both eyes 3 (three) times daily as needed (dry eyes).     Cholecalciferol (D3 PO) Take 1 capsule by mouth daily.     citalopram (CELEXA) 10 MG tablet Take 1 tablet (10 mg total) by mouth daily. 90 tablet 1   fluticasone (FLONASE) 50 MCG/ACT nasal spray PLACE 2 SPRAYS INTO BOTH NOSTRILS DAILY. (Patient taking differently: Place 2 sprays into both nostrils daily as needed for allergies.) 48 g 1   folic acid (FOLVITE) 1 MG tablet Take 1 tablet (1 mg total) by mouth daily. 30 tablet 2   Lacosamide 100 MG TABS Take 1 tablet by mouth twice daily 60 tablet 0   levETIRAcetam (KEPPRA) 100 MG/ML solution TAKE 15 ML BY MOUTH  TWICE DAILY 473 mL 0   loratadine (CLARITIN) 10 MG tablet Take 10 mg by mouth daily as needed for allergies.     magnesium hydroxide (MILK OF MAGNESIA) 800 MG/5ML suspension Take 30 mLs by mouth daily as needed for constipation.     Menthol, Topical Analgesic, (BIOFREEZE EX) Apply 1 application topically as needed (pain).     mirtazapine (REMERON) 45 MG tablet Take 1 tablet (45 mg total) by mouth at bedtime. 30 tablet 3   olopatadine (PATANOL) 0.1 % ophthalmic solution Place 1 drop into both eyes 2 (two) times daily as needed for allergies.     omeprazole (PRILOSEC) 40 MG capsule TAKE 1 CAPSULE TWICE DAILY BEFORE MEALS (Patient taking differently: Take 40 mg by mouth daily.) 180 capsule 0   oxyCODONE (OXY IR/ROXICODONE) 5 MG immediate release tablet Take 5 mg by mouth every 8 (eight) hours as needed.     polyethylene glycol (MIRALAX / GLYCOLAX) 17 g packet Take 17 g by mouth 2 (two) times daily. 72 each 0   pravastatin (PRAVACHOL) 40 MG tablet TAKE 1 TABLET (40 MG TOTAL) BY MOUTH  EVERY EVENING. (Patient taking differently: Take 40 mg by mouth daily.) 90 tablet 0   Probiotic Product (PROBIOTIC DAILY PO) Take 1 capsule by mouth daily.     prochlorperazine (COMPAZINE) 10 MG tablet TAKE 1 TABLET BY MOUTH EVERY 6 HOURS AS NEEDED FOR NAUSEA OR VOMITING 30 tablet 0   simethicone (MYLICON) 80 MG chewable tablet Chew 1 tablet (80 mg total) by mouth 4 (four) times daily as needed for flatulence (Bloating). 30 tablet 0   acetaminophen (TYLENOL) 500 MG tablet Take 2 tablets (1,000 mg total) by mouth every 6 (six) hours. (Patient not taking: Reported on 10/27/2021) 30 tablet 0   dexamethasone (DECADRON) 1 MG tablet Take 1 tablet (1 mg total) by mouth  daily. (Patient not taking: Reported on 10/24/2021) 60 tablet 1   No current facility-administered medications for this visit.   Facility-Administered Medications Ordered in Other Visits  Medication Dose Route Frequency Provider Last Rate Last Admin   PEMEtrexed (ALIMTA) 800 mg in sodium chloride 0.9 % 100 mL chemo infusion  800 mg Intravenous Once Curt Bears, MD        SURGICAL HISTORY:  Past Surgical History:  Procedure Laterality Date   ABDOMINAL HYSTERECTOMY  8916   APPLICATION OF CRANIAL NAVIGATION N/A 08/24/2020   Procedure: APPLICATION OF CRANIAL NAVIGATION;  Surgeon: Judith Part, MD;  Location: [facility];  Service: Neurosurgery;  Laterality: N/A;   COLONOSCOPY     COLONOSCOPY W/ POLYPECTOMY  2009   CRANIOTOMY Left 08/24/2020   Procedure: Left Craniotomy for Tumor Resection with Brainlab;  Surgeon: Judith Part, MD;  Location: [facility];  Service: Neurosurgery;  Laterality: Left;   HEMORRHOID SURGERY     INTERCOSTAL NERVE BLOCK Right 09/24/2020   Procedure: INTERCOSTAL NERVE BLOCK;  Surgeon: Melrose Nakayama, MD;  Location: [facility];  Service: Thoracic;  Laterality: Right;   JOINT REPLACEMENT     LYMPH NODE DISSECTION Right 09/24/2020   Procedure: LYMPH NODE DISSECTION;  Surgeon: Melrose Nakayama, MD;   Location: [facility];  Service: Thoracic;  Laterality: Right;   POLYPECTOMY     right total knee arthroplasty     Dr. Emeterio Reeve 06-04-18   [place]  08/16/2009   TOTAL KNEE ARTHROPLASTY Left 02/13/2014   Procedure: TOTAL KNEE ARTHROPLASTY;  Surgeon: Alta Corning, MD;  Location: [facility];  Service: Orthopedics;  Laterality: Left;   TOTAL KNEE ARTHROPLASTY Right 06/04/2018   Procedure: RIGHT TOTAL KNEE ARTHROPLASTY;  Surgeon: Dorna Leitz, MD;  Location: [facility];  Service: Orthopedics;  Laterality: Right;  Adductor Block   TUBAL LIGATION      REVIEW OF SYSTEMS:   Review of Systems  Constitutional: Negative for appetite change, chills, fatigue, fever and unexpected weight change.  HENT:   Negative for mouth sores, nosebleeds, sore throat and trouble swallowing.   Eyes: Negative for eye problems and icterus.  Respiratory: Negative for cough, hemoptysis, shortness of breath and wheezing.   Cardiovascular: Negative for chest pain and leg swelling.  Gastrointestinal: Negative for abdominal pain, constipation, diarrhea, nausea and vomiting.  Genitourinary: Negative for bladder incontinence, difficulty urinating, dysuria, frequency and hematuria.   Musculoskeletal: Negative for back pain, gait problem, neck pain and neck stiffness.  Skin: Positive for mild superficial erythema on left breast Neurological: Negative for dizziness, extremity weakness, gait problem, headaches, light-headedness and seizures.  Hematological: Negative for adenopathy. Does not bruise/bleed easily.  Psychiatric/Behavioral: Negative for confusion, depression and sleep disturbance. The patient is not nervous/anxious.     PHYSICAL EXAMINATION:  Blood pressure (!) 149/78, pulse 85, temperature (!) 96.8 F (36 C), temperature source Tympanic, resp. rate 18, weight 138 lb 4 oz (62.7 kg), SpO2 99 %.  ECOG PERFORMANCE STATUS: 1  Physical Exam  Constitutional: Oriented to person, place, and time and thin appearing female and in no  distress. HENT: Head: Normocephalic and atraumatic. Mouth/Throat: Oropharynx is clear and moist. No oropharyngeal exudate. No thrush.  Eyes: Conjunctivae are normal. Right eye exhibits no discharge. Left eye exhibits no discharge. No scleral icterus. Neck: Normal range of motion. Neck supple. Cardiovascular: Normal rate, regular rhythm, systolic murmur noted and intact distal pulses.   Pulmonary/Chest: Effort normal. Decreased breath sounds in right lung base. No respiratory distress. No wheezes. No  rales. Abdominal: Soft. Bowel sounds are normal. Exhibits no distension and no mass. There is no tenderness.  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 No weakness in hands. No decreased sensation. No swelling or erythema. Normal range of motion. Exhibits no edema.  Lymphadenopathy:    No cervical adenopathy.  Neurological: Alert and oriented to person, place, and time. Exhibits normal muscle tone. Gait normal. Coordination normal. Skin: Positive for mild erythema on left breast without heat, swelling, masses, or rash. Skin is warm and dry. Not diaphoretic. No pallor.  Psychiatric: Mood, memory and judgment normal. Vitals reviewed  LABORATORY DATA: Lab Results  Component Value Date   WBC 4.6 12/05/2021   HGB 11.4 (L) 12/05/2021   HCT 36.0 12/05/2021   MCV 90.5 12/05/2021   PLT 344 12/05/2021      Chemistry      Component Value Date/Time   NA 141 12/05/2021 0959   K 4.1 12/05/2021 0959   CL 105 12/05/2021 0959   CO2 30 12/05/2021 0959   BUN 8 12/05/2021 0959   CREATININE 0.72 12/05/2021 0959   CREATININE 0.79 03/14/2020 1016      Component Value Date/Time   CALCIUM 10.0 12/05/2021 0959   ALKPHOS 74 12/05/2021 0959   AST 28 12/05/2021 0959   ALT 18 12/05/2021 0959   BILITOT 0.2 (L) 12/05/2021 0959       RADIOGRAPHIC STUDIES:  MR Brain W Wo Contrast  Result Date: 11/06/2021 CLINICAL DATA:  Provided history: Metastasis to brain. Brain metastases, assess treatment  response. Postop SRS treatment planning. Patient had LITT procedure on 04/17. EXAM: MRI HEAD WITHOUT AND WITH CONTRAST TECHNIQUE: Multiplanar, multiecho pulse sequences of the brain and surrounding structures were obtained without and with intravenous contrast. CONTRAST:  17m MULTIHANCE GADOBENATE DIMEGLUMINE 529 MG/ML IV SOLN COMPARISON:  Prior head CT examinations 10/27/2021 and earlier. Prior brain MRI examinations 10/15/2021 and earlier. FINDINGS: Mild intermittent motion degradation. Brain: Mild generalized cerebral atrophy. Since the prior brain MRI of 09/20/2021, there has been interval laser interstitial thermal therapy (LITT) of a metastasis within the left parietooccipital lobes. The lesion has increased in size, now measuring 2.4 x 2.3 x 2.6 cm, previously 1.7 x 2.1 x 2.0 cm (remeasured on prior). Additionally, there are progressive cystic/necrotic changes centrally. Restricted diffusion and non-acute blood products within the cystic/necrotic component. As before, the lesion demonstrates peripheral enhancement. Moderate surrounding edema is similar. Enhancement at site of the lesion within the left superior frontal lobe has somewhat changed in configuration, and there has been slight progressive collapse of the resection cavity at this site. However, enhancement at this site spans 2 cm in greatest dimension, not significantly changed from the prior MRI. Surrounding T2 FLAIR hyperintense signal abnormality is stable. Chronic blood products associated with this lesion. No new intracranial metastases are identified. Background mild multifocal T2 FLAIR hyperintense signal abnormality within the cerebral white matter and pons, nonspecific but compatible chronic small vessel disease. There is no acute infarct. No extra-axial fluid collection. No midline shift. Vascular: Maintained flow voids within the proximal large arterial vessels. Skull and upper cervical spine: No focal suspicious marrow lesion. Left  parietal cranioplasty. Defect within the left parietooccipital calvarium from prior LITT. Incompletely assessed cervical spondylosis. Sinuses/Orbits: No mass or acute finding within the imaged orbits. Minimal mucosal thickening within the frontal sinuses. Mild-to-moderate mucosal thickening within the bilateral ethmoid sinuses. Mild mucosal thickening within the right sphenoid sinus. Multiple mucous retention cysts, and background mild mucosal thickening, within the left sphenoid sinus. Mild  mucosal thickening within the bilateral maxillary sinuses. Other: Small right mastoid effusion. IMPRESSION: Interval laser interstitial thermal therapy (LITT) for the left parietooccipital lobe metastasis. The lesion has increased in size, and there are progressive cystic/necrotic changes centrally. Moderate surrounding edema is stable. These findings are not unexpected approximately one month following therapy. Continued MR imaging follow-up recommended. Enhancement at site of a lesion within the left superior frontal lobe has slightly changed in configuration. However, the lesion has not significantly changed in size, again spanning 2 cm. Surrounding T2 FLAIR hyperintense signal abnormality is stable. No new intracranial metastasis is identified. Paranasal sinus disease, as described. Small right mastoid effusion. Electronically Signed   By: Kellie Simmering D.O.   On: 11/06/2021 14:40     ASSESSMENT/PLAN:  This is a very pleasant 71 year old African-American female diagnosed with stage IV (T3, N0, M1 B) non-small cell lung cancer, adenocarcinoma.  She presented with a right upper lobe lung mass with a solitary brain metastasis.  She was diagnosed in February 2022.  Her PD-L1 expression is 90%.  She does not have any actionable mutations.   The patient completed SRS followed by craniotomy and resection on 08/23/2020 under the care of Dr. Tammi Klippel and craniotomy under the care of Dr. Zada Finders on 08/24/2020.   She then had a  robot-assisted right upper lobectomy with en bloc wedge resection of the right middle lobe and lymph node dissection under the care of Dr. Roxan Hockey on September 24, 2020.  She underwent SRS to the two new subcentimeter brain metastases on 11/29/20.   The patient is currently undergoing systemic chemotherapy with carboplatin for an AUC of 5, Alimta 500 mg per metered squared, Keytruda 200 mg IV every 3 weeks.  She is status post 17 cycles and tolerated it well without any adverse side effects.  Starting from cycle #5, the patient started maintenance Alimta and Keytruda.   She recently had progessive metastatic disease to the brain and is status post SRS which was completed on 11/18/21.    Labs were reviewed. Recommend that she proceed with cycle #18 today as scheduled.   We will see her back for a follow up visit in 3 weeks for evaluation before starting her next cycle of treatment.   I will arrange for a restaging CT scan before starting her next cycle of treatment.   Reviewed the patient's mild breast erythema with Dr. Julien Nordmann.  Dr. Julien Nordmann recommended using hydrocortisone cream.  I also encouraged the patient to ensure that she schedules her routine mammograms.  She states she is due for mammogram this year.  The patient was advised to call immediately if she has any concerning symptoms in the interval. The patient voices understanding of current disease status and treatment options and is in agreement with the current care plan. All questions were answered. The patient knows to call the clinic with any problems, questions or concerns. We can certainly see the patient much sooner if necessary     Orders Placed This Encounter  Procedures   CT Chest W Contrast    Standing Status:   Future    Standing Expiration Date:   12/05/2022    Order Specific Question:   If indicated for the ordered procedure, I authorize the administration of contrast media per Radiology protocol    Answer:   Yes     Order Specific Question:   Preferred imaging location?    Answer:   [facility]   CT Abdomen Pelvis W Contrast  Standing Status:   Future    Standing Expiration Date:   12/05/2022    Order Specific Question:   If indicated for the ordered procedure, I authorize the administration of contrast media per Radiology protocol    Answer:   Yes    Order Specific Question:   Preferred imaging location?    Answer:   [facility]    Order Specific Question:   Is Oral Contrast requested for this exam?    Answer:   Yes, Per Radiology protocol     The total time spent in the appointment was 20-29 minutes.   Cassandra L Heilingoetter, PA-C 12/05/21

## 2021-12-02 ENCOUNTER — Encounter: Payer: Self-pay | Admitting: Internal Medicine

## 2021-12-02 ENCOUNTER — Ambulatory Visit
Admission: RE | Admit: 2021-12-02 | Discharge: 2021-12-02 | Disposition: A | Discharge status: Home / Self Care | Payer: Self-pay | Source: Ambulatory Visit | Attending: Radiation Oncology | Admitting: Radiation Oncology

## 2021-12-02 ENCOUNTER — Telehealth: Payer: Self-pay

## 2021-12-02 ENCOUNTER — Other Ambulatory Visit: Payer: Self-pay | Admitting: Radiation Therapy

## 2021-12-02 DIAGNOSIS — C7931 Secondary malignant neoplasm of brain: Secondary | ICD-10-CM

## 2021-12-02 NOTE — Telephone Encounter (Signed)
Pt experiencing drowsiness and the daughter [name] asked if her seizure medications could be decreased due to the side effect. This LPN told the pt's daughter it is too soon to decrease the seizure medications per Dr. Mickeal Skinner and that a follow up appt has been made for 6/22 at 11:00am after radiation appt. [name] verbalized understanding. No further questions or concerns.

## 2021-12-04 ENCOUNTER — Other Ambulatory Visit: Payer: Self-pay | Admitting: Radiation Therapy

## 2021-12-05 ENCOUNTER — Inpatient Hospital Stay: Payer: Medicare HMO

## 2021-12-05 ENCOUNTER — Encounter: Payer: Self-pay | Admitting: Internal Medicine

## 2021-12-05 ENCOUNTER — Other Ambulatory Visit: Payer: Self-pay

## 2021-12-05 ENCOUNTER — Inpatient Hospital Stay (HOSPITAL_BASED_OUTPATIENT_CLINIC_OR_DEPARTMENT_OTHER): Payer: Medicare HMO | Admitting: Physician Assistant

## 2021-12-05 ENCOUNTER — Inpatient Hospital Stay: Payer: Medicare HMO | Attending: Internal Medicine

## 2021-12-05 VITALS — BP 149/78 | HR 85 | Temp 96.8°F | Resp 18 | Wt 138.2 lb

## 2021-12-05 DIAGNOSIS — I6782 Cerebral ischemia: Secondary | ICD-10-CM | POA: Diagnosis not present

## 2021-12-05 DIAGNOSIS — M19012 Primary osteoarthritis, left shoulder: Secondary | ICD-10-CM | POA: Diagnosis not present

## 2021-12-05 DIAGNOSIS — Z5111 Encounter for antineoplastic chemotherapy: Secondary | ICD-10-CM | POA: Diagnosis not present

## 2021-12-05 DIAGNOSIS — Z5112 Encounter for antineoplastic immunotherapy: Secondary | ICD-10-CM | POA: Diagnosis not present

## 2021-12-05 DIAGNOSIS — Z888 Allergy status to other drugs, medicaments and biological substances status: Secondary | ICD-10-CM | POA: Insufficient documentation

## 2021-12-05 DIAGNOSIS — K219 Gastro-esophageal reflux disease without esophagitis: Secondary | ICD-10-CM | POA: Diagnosis not present

## 2021-12-05 DIAGNOSIS — Z809 Family history of malignant neoplasm, unspecified: Secondary | ICD-10-CM | POA: Insufficient documentation

## 2021-12-05 DIAGNOSIS — R609 Edema, unspecified: Secondary | ICD-10-CM | POA: Insufficient documentation

## 2021-12-05 DIAGNOSIS — I2510 Atherosclerotic heart disease of native coronary artery without angina pectoris: Secondary | ICD-10-CM | POA: Diagnosis not present

## 2021-12-05 DIAGNOSIS — C3411 Malignant neoplasm of upper lobe, right bronchus or lung: Secondary | ICD-10-CM | POA: Insufficient documentation

## 2021-12-05 DIAGNOSIS — C7931 Secondary malignant neoplasm of brain: Secondary | ICD-10-CM | POA: Insufficient documentation

## 2021-12-05 DIAGNOSIS — E785 Hyperlipidemia, unspecified: Secondary | ICD-10-CM | POA: Diagnosis not present

## 2021-12-05 DIAGNOSIS — Z79899 Other long term (current) drug therapy: Secondary | ICD-10-CM | POA: Diagnosis not present

## 2021-12-05 DIAGNOSIS — J432 Centrilobular emphysema: Secondary | ICD-10-CM | POA: Insufficient documentation

## 2021-12-05 DIAGNOSIS — Z8379 Family history of other diseases of the digestive system: Secondary | ICD-10-CM | POA: Insufficient documentation

## 2021-12-05 DIAGNOSIS — K8020 Calculus of gallbladder without cholecystitis without obstruction: Secondary | ICD-10-CM | POA: Insufficient documentation

## 2021-12-05 DIAGNOSIS — F32A Depression, unspecified: Secondary | ICD-10-CM | POA: Diagnosis not present

## 2021-12-05 DIAGNOSIS — Z8719 Personal history of other diseases of the digestive system: Secondary | ICD-10-CM | POA: Insufficient documentation

## 2021-12-05 DIAGNOSIS — Z8042 Family history of malignant neoplasm of prostate: Secondary | ICD-10-CM | POA: Insufficient documentation

## 2021-12-05 DIAGNOSIS — M1611 Unilateral primary osteoarthritis, right hip: Secondary | ICD-10-CM | POA: Insufficient documentation

## 2021-12-05 DIAGNOSIS — Z833 Family history of diabetes mellitus: Secondary | ICD-10-CM | POA: Insufficient documentation

## 2021-12-05 DIAGNOSIS — Z90710 Acquired absence of both cervix and uterus: Secondary | ICD-10-CM | POA: Insufficient documentation

## 2021-12-05 DIAGNOSIS — Z87891 Personal history of nicotine dependence: Secondary | ICD-10-CM | POA: Diagnosis not present

## 2021-12-05 DIAGNOSIS — M47814 Spondylosis without myelopathy or radiculopathy, thoracic region: Secondary | ICD-10-CM | POA: Insufficient documentation

## 2021-12-05 DIAGNOSIS — I700 Atherosclerosis of aorta: Secondary | ICD-10-CM | POA: Diagnosis not present

## 2021-12-05 DIAGNOSIS — Z803 Family history of malignant neoplasm of breast: Secondary | ICD-10-CM | POA: Insufficient documentation

## 2021-12-05 DIAGNOSIS — Z7952 Long term (current) use of systemic steroids: Secondary | ICD-10-CM | POA: Insufficient documentation

## 2021-12-05 DIAGNOSIS — Z823 Family history of stroke: Secondary | ICD-10-CM | POA: Insufficient documentation

## 2021-12-05 DIAGNOSIS — M47812 Spondylosis without myelopathy or radiculopathy, cervical region: Secondary | ICD-10-CM | POA: Diagnosis not present

## 2021-12-05 DIAGNOSIS — F419 Anxiety disorder, unspecified: Secondary | ICD-10-CM | POA: Insufficient documentation

## 2021-12-05 DIAGNOSIS — Z902 Acquired absence of lung [part of]: Secondary | ICD-10-CM | POA: Insufficient documentation

## 2021-12-05 DIAGNOSIS — M8580 Other specified disorders of bone density and structure, unspecified site: Secondary | ICD-10-CM | POA: Insufficient documentation

## 2021-12-05 LAB — CMP (CANCER CENTER ONLY)
ALT: 18 U/L (ref 0–44)
AST: 28 U/L (ref 15–41)
Albumin: 4 g/dL (ref 3.5–5.0)
Alkaline Phosphatase: 74 U/L (ref 38–126)
Anion gap: 6 (ref 5–15)
BUN: 8 mg/dL (ref 8–23)
CO2: 30 mmol/L (ref 22–32)
Calcium: 10 mg/dL (ref 8.9–10.3)
Chloride: 105 mmol/L (ref 98–111)
Creatinine: 0.72 mg/dL (ref 0.44–1.00)
GFR, Estimated: >60 mL/min (ref >60)
Glucose, Bld: 118 mg/dL — ABNORMAL HIGH (ref 70–99)
Potassium: 4.1 mmol/L (ref 3.5–5.1)
Sodium: 141 mmol/L (ref 135–145)
Total Bilirubin: 0.2 mg/dL — ABNORMAL LOW (ref 0.3–1.2)
Total Protein: 7 g/dL (ref 6.5–8.1)

## 2021-12-05 LAB — TSH: TSH: 0.53 u[IU]/mL (ref 0.350–4.500)

## 2021-12-05 LAB — CBC WITH DIFFERENTIAL (CANCER CENTER ONLY)
Abs Immature Granulocytes: 0.01 10*3/uL (ref 0.00–0.07)
Basophils Absolute: 0 10*3/uL (ref 0.0–0.1)
Basophils Relative: 1 %
Eosinophils Absolute: 0 10*3/uL (ref 0.0–0.5)
Eosinophils Relative: 0 %
HCT: 36 % (ref 36.0–46.0)
Hemoglobin: 11.4 g/dL — ABNORMAL LOW (ref 12.0–15.0)
Immature Granulocytes: 0 %
Lymphocytes Relative: 34 %
Lymphs Abs: 1.5 10*3/uL (ref 0.7–4.0)
MCH: 28.6 pg (ref 26.0–34.0)
MCHC: 31.7 g/dL (ref 30.0–36.0)
MCV: 90.5 fL (ref 80.0–100.0)
Monocytes Absolute: 0.6 10*3/uL (ref 0.1–1.0)
Monocytes Relative: 13 %
Neutro Abs: 2.4 10*3/uL (ref 1.7–7.7)
Neutrophils Relative %: 52 %
Platelet Count: 344 10*3/uL (ref 150–400)
RBC: 3.98 MIL/uL (ref 3.87–5.11)
RDW: 13.1 % (ref 11.5–15.5)
WBC Count: 4.6 10*3/uL (ref 4.0–10.5)
nRBC: 0 % (ref 0.0–0.2)

## 2021-12-05 MED ORDER — SODIUM CHLORIDE 0.9 % IV SOLN: Freq: Once | INTRAVENOUS | Status: AC

## 2021-12-05 MED ORDER — SODIUM CHLORIDE 0.9 % IV SOLN
200.0000 mg | Freq: Once | INTRAVENOUS | Status: AC
  Administered 2021-12-05: 200 mg via INTRAVENOUS
  Filled 2021-12-05: qty 200

## 2021-12-05 MED ORDER — SODIUM CHLORIDE 0.9 % IV SOLN
800.0000 mg | Freq: Once | INTRAVENOUS | Status: AC
  Administered 2021-12-05: 800 mg via INTRAVENOUS
  Filled 2021-12-05: qty 20

## 2021-12-05 MED ORDER — PROCHLORPERAZINE MALEATE 10 MG PO TABS
10.0000 mg | ORAL_TABLET | Freq: Once | ORAL | Status: AC
  Administered 2021-12-05: 10 mg via ORAL
  Filled 2021-12-05: qty 1

## 2021-12-05 NOTE — Patient Instructions (Signed)
[facility]ICAL [facility]COL[facility]  Discharge Instructions: Thank you for choosing [facility] to provide your oncology and hematology care.   If you have a lab appointment with the Cancer Center, please go directly to the Cancer Center and check in at the registration area.   Wear comfortable clothing and clothing appropriate for easy access to any Portacath or PICC line.   We strive to give you quality time with your provider. You may need to reschedule your appointment if you arrive late (15 or more minutes).  Arriving late affects you and other patients whose appointments are after yours.  Also, if you miss three or more appointments without notifying the office, you may be dismissed from the clinic at the provider's discretion.      For prescription refill requests, have your pharmacy contact our office and allow 72 hours for refills to be completed.    Today you received the following chemotherapy and/or immunotherapy agents keytruda, alimta      To help prevent nausea and vomiting after your treatment, we encourage you to take your nausea medication as directed.  BELOW ARE SYMPTOMS THAT SHOULD BE REPORTED IMMEDIATELY: *FEVER GREATER THAN 100.4 F (38 C) OR HIGHER *CHILLS OR SWEATING *NAUSEA AND VOMITING THAT IS NOT CONTROLLED WITH YOUR NAUSEA MEDICATION *UNUSUAL SHORTNESS OF BREATH *UNUSUAL BRUISING OR BLEEDING *URINARY PROBLEMS (pain or burning when urinating, or frequent urination) *BOWEL PROBLEMS (unusual diarrhea, constipation, pain near the anus) TENDERNESS IN MOUTH AND THROAT WITH OR WITHOUT PRESENCE OF ULCERS (sore throat, sores in mouth, or a toothache) UNUSUAL RASH, SWELLING OR PAIN  UNUSUAL VAGINAL DISCHARGE OR ITCHING   Items with * indicate a potential emergency and should be followed up as soon as possible or go to the Emergency Department if any problems should occur.  Please show the CHEMOTHERAPY ALERT CARD or IMMUNOTHERAPY ALERT CARD at  check-in to the Emergency Department and triage nurse.  Should you have questions after your visit or need to cancel or reschedule your appointment, please contact [facility]  Dept: [phone]  and follow the prompts.  Office hours are 8:00 a.m. to 4:30 p.m. Monday - Friday. Please note that voicemails left after 4:00 p.m. may not be returned until the following business day.  We are closed weekends and major holidays. You have access to a nurse at all times for urgent questions. Please call the main number to the clinic Dept: [phone] and follow the prompts.   For any non-urgent questions, you may also contact your provider using MyChart. We now offer e-Visits for anyone 18 and older to request care online for non-urgent symptoms. For details visit [email or web address].   Also download the MyChart app! Go to the app store, search "MyChart", open the app, select [facility], and log in with your MyChart username and password.  Due to Covid, a mask is required upon entering the hospital/clinic. If you do not have a mask, one will be given to you upon arrival. For doctor visits, patients may have 1 support person aged 18 or older with them. For treatment visits, patients cannot have anyone with them due to current Covid guidelines and our immunocompromised population.   

## 2021-12-10 ENCOUNTER — Encounter: Payer: Self-pay | Admitting: Internal Medicine

## 2021-12-10 NOTE — Progress Notes (Unsigned)
Subjective:    Patient ID: Hannah [name], female    DOB: [birth year], 71 y.o.   MRN: [id number]      HPI Hannah [name] is here for No chief complaint on file.   Arthritis pain -      Medications and allergies reviewed with patient and updated if appropriate.  Current Outpatient Medications on File Prior to Visit  Medication Sig Dispense Refill   acetaminophen (TYLENOL) 325 MG tablet Take 325 mg by mouth every 6 (six) hours as needed for moderate pain.     acetaminophen (TYLENOL) 500 MG tablet Take 2 tablets (1,000 mg total) by mouth every 6 (six) hours. (Patient not taking: Reported on 10/27/2021) 30 tablet 0   ALPRAZolam (XANAX XR) 0.5 MG 24 hr tablet Take one tablet (0.5 mg total) by mouth every night at bedtime. 30 tablet 5   ALPRAZolam (XANAX) 0.5 MG tablet Take one tablet (0.5 mg total) by mouth qam and may take one more tablet (0.5 mg total) qd prn for anxiety. (Patient taking differently: Take 0.5 mg by mouth at bedtime as needed for anxiety. Take one tablet (0.5 mg XR total) by mouth qam and may take one more tablet (0.5 mg total) qd prn for anxiety.) 120 tablet 5   Ascorbic Acid (VITAMIN C) 1000 MG tablet Take 1,000 mg by mouth daily.     aspirin 81 MG tablet Take 1 tablet (81 mg total) by mouth daily. Restart on 08/31/20 30 tablet    bisacodyl (DULCOLAX) 5 MG EC tablet Take 5 mg by mouth daily as needed for moderate constipation.     carboxymethylcellulose (REFRESH PLUS) 0.5 % SOLN Place 1-2 drops into both eyes 3 (three) times daily as needed (dry eyes).     Cholecalciferol (D3 PO) Take 1 capsule by mouth daily.     citalopram (CELEXA) 10 MG tablet Take 1 tablet (10 mg total) by mouth daily. 90 tablet 1   dexamethasone (DECADRON) 1 MG tablet Take 1 tablet (1 mg total) by mouth daily. (Patient not taking: Reported on 10/24/2021) 60 tablet 1   fluticasone (FLONASE) 50 MCG/ACT nasal spray PLACE 2 SPRAYS INTO BOTH NOSTRILS DAILY. (Patient taking differently: Place 2 sprays into both  nostrils daily as needed for allergies.) 48 g 1   folic acid (FOLVITE) 1 MG tablet Take 1 tablet (1 mg total) by mouth daily. 30 tablet 2   Lacosamide 100 MG TABS Take 1 tablet by mouth twice daily 60 tablet 0   levETIRAcetam (KEPPRA) 100 MG/ML solution TAKE 15 ML BY MOUTH  TWICE DAILY 473 mL 0   loratadine (CLARITIN) 10 MG tablet Take 10 mg by mouth daily as needed for allergies.     magnesium hydroxide (MILK OF MAGNESIA) 800 MG/5ML suspension Take 30 mLs by mouth daily as needed for constipation.     Menthol, Topical Analgesic, (BIOFREEZE EX) Apply 1 application topically as needed (pain).     mirtazapine (REMERON) 45 MG tablet Take 1 tablet (45 mg total) by mouth at bedtime. 30 tablet 3   olopatadine (PATANOL) 0.1 % ophthalmic solution Place 1 drop into both eyes 2 (two) times daily as needed for allergies.     omeprazole (PRILOSEC) 40 MG capsule TAKE 1 CAPSULE TWICE DAILY BEFORE MEALS (Patient taking differently: Take 40 mg by mouth daily.) 180 capsule 0   oxyCODONE (OXY IR/ROXICODONE) 5 MG immediate release tablet Take 5 mg by mouth every 8 (eight) hours as needed.     polyethylene glycol (MIRALAX /  GLYCOLAX) 17 g packet Take 17 g by mouth 2 (two) times daily. 72 each 0   pravastatin (PRAVACHOL) 40 MG tablet TAKE 1 TABLET (40 MG TOTAL) BY MOUTH EVERY EVENING. (Patient taking differently: Take 40 mg by mouth daily.) 90 tablet 0   Probiotic Product (PROBIOTIC DAILY PO) Take 1 capsule by mouth daily.     prochlorperazine (COMPAZINE) 10 MG tablet TAKE 1 TABLET BY MOUTH EVERY 6 HOURS AS NEEDED FOR NAUSEA OR VOMITING 30 tablet 0   simethicone (MYLICON) 80 MG chewable tablet Chew 1 tablet (80 mg total) by mouth 4 (four) times daily as needed for flatulence (Bloating). 30 tablet 0   No current facility-administered medications on file prior to visit.    Review of Systems     Objective:  There were no vitals filed for this visit. BP Readings from Last 3 Encounters:  12/05/21 (!) 149/78   11/15/21 (!) 158/85  11/14/21 136/80   Wt Readings from Last 3 Encounters:  12/05/21 138 lb 4 oz (62.7 kg)  11/14/21 137 lb 7 oz (62.3 kg)  11/05/21 136 lb 3.2 oz (61.8 kg)   There is no height or weight on file to calculate BMI.    Physical Exam         Assessment & Plan:    See Problem List for Assessment and Plan of chronic medical problems.

## 2021-12-11 ENCOUNTER — Ambulatory Visit: Payer: Self-pay

## 2021-12-11 ENCOUNTER — Ambulatory Visit: Payer: Medicare HMO | Admitting: Internal Medicine

## 2021-12-11 ENCOUNTER — Encounter: Payer: Self-pay | Admitting: Family Medicine

## 2021-12-11 ENCOUNTER — Ambulatory Visit: Payer: Medicare HMO | Admitting: Family Medicine

## 2021-12-11 ENCOUNTER — Ambulatory Visit (INDEPENDENT_AMBULATORY_CARE_PROVIDER_SITE_OTHER): Payer: Medicare HMO

## 2021-12-11 ENCOUNTER — Other Ambulatory Visit: Payer: Self-pay | Admitting: Internal Medicine

## 2021-12-11 VITALS — BP 120/78 | HR 105 | Temp 97.7°F | Ht 60.0 in | Wt 135.0 lb

## 2021-12-11 VITALS — BP 120/78 | HR 105 | Ht 60.0 in | Wt 135.0 lb

## 2021-12-11 DIAGNOSIS — G8929 Other chronic pain: Secondary | ICD-10-CM

## 2021-12-11 DIAGNOSIS — M25511 Pain in right shoulder: Secondary | ICD-10-CM | POA: Diagnosis not present

## 2021-12-11 DIAGNOSIS — R5383 Other fatigue: Secondary | ICD-10-CM | POA: Diagnosis not present

## 2021-12-11 DIAGNOSIS — M25512 Pain in left shoulder: Secondary | ICD-10-CM

## 2021-12-11 DIAGNOSIS — M19012 Primary osteoarthritis, left shoulder: Secondary | ICD-10-CM | POA: Diagnosis not present

## 2021-12-11 DIAGNOSIS — F419 Anxiety disorder, unspecified: Secondary | ICD-10-CM | POA: Diagnosis not present

## 2021-12-11 DIAGNOSIS — G479 Sleep disorder, unspecified: Secondary | ICD-10-CM | POA: Diagnosis not present

## 2021-12-11 DIAGNOSIS — R350 Frequency of micturition: Secondary | ICD-10-CM | POA: Diagnosis not present

## 2021-12-11 DIAGNOSIS — F3289 Other specified depressive episodes: Secondary | ICD-10-CM | POA: Diagnosis not present

## 2021-12-11 LAB — URINALYSIS, ROUTINE W REFLEX MICROSCOPIC
Bilirubin Urine: NEGATIVE
Hgb urine dipstick: NEGATIVE
Ketones, ur: NEGATIVE
Nitrite: NEGATIVE
RBC / HPF: NONE SEEN (ref 0–?)
Specific Gravity, Urine: 1.015 (ref 1.000–1.030)
Total Protein, Urine: NEGATIVE
Urine Glucose: NEGATIVE
Urobilinogen, UA: 0.2 (ref 0.0–1.0)
pH: 6 (ref 5.0–8.0)

## 2021-12-11 MED ORDER — MIRTAZAPINE 30 MG PO TABS: 30.0000 mg | ORAL_TABLET | Freq: Every day | ORAL | 5 refills | Status: DC

## 2021-12-11 NOTE — Patient Instructions (Addendum)
     Urine tests were ordered.     Medications changes include :   decrease remeron to 30 mg night.  You can try to stop the celexa if you want.     Your prescription(s) have been sent to your pharmacy.    A referral was ordered for sports medicine.     Someone from that office will call you to schedule an appointment.

## 2021-12-11 NOTE — Assessment & Plan Note (Signed)
Chronic Well-controlled She is having excessive drowsiness especially during the day The drowsiness is likely multifactorial We will try decreasing Remeron to see if that helps-currently taking 45 mg-decrease to 30 mg At some point in the near future can try holding Celexa to see if she truly needs it at this point-hard to say if that is contributing or not

## 2021-12-11 NOTE — Assessment & Plan Note (Addendum)
Chronic Anxiety is currently controlled We will continue Xanax XR 0.5 mg daily in the morning Has not been taking short acting Xanax as needed-Will remove from chart Continue Remeron at night, but decrease dose to 30 mg to see if that helps with her drowsiness Daughter wonders if the Celexa is causing any of the drowsiness-discussed she is on a low-dose and since her anxiety is well-controlled after changing the Remeron could consider holding that to see if she truly needs it

## 2021-12-11 NOTE — Assessment & Plan Note (Signed)
Chronic Bilateral shoulder pain with decreased range of motion, left shoulder pops ?  Arthritis, bursitis, rotator cuff disease We will refer to sports medicine for further evaluation and treatment

## 2021-12-11 NOTE — Assessment & Plan Note (Signed)
Acute Has some urinary frequency,Has also had a little bit of confusion and drowsiness Daughter wants to make sure she does not have a UTI Check UA, urine culture

## 2021-12-11 NOTE — Progress Notes (Signed)
Subjective:    CC: B shoulder pain  I, Molly Weber, LAT, ATC, am serving as scribe for Dr. Lynne Leader.  HPI: Pt is a 71 y/o female presenting w/ c/o B shoulder pain x 3 months that has been worsening over the last month.  She locates her pain to entire shoulder.  She has a hx of lung CA that metatastisized to her brain.    Radiating pain: yes into her entire arm to her fingers Neck pain: no Shoulder mechanical symptoms: yes, popping noted in B shoulder UE paresthesias: yes in her L Aggravating factors: attempts at B shoulder flexion AROM above 45-50 deg; laying on her side Treatments tried: Biofreeze; Tylenol   Pertinent review of Systems: No fevers.  Positive for chills..  Positive for fatigue.  Relevant historical information: Lung cancer metastatic to brain.   Objective:    Vitals:   12/11/21 1153  BP: 120/78  Pulse: (!) 105  SpO2: 98%   General: Well Developed, well nourished, and in no acute distress.   MSK: Right shoulder: Normal appearing Decreased range of motion pain with abduction and external rotation. Decreased strength abduction.  Left shoulder: Normal appearing. Decreased range of motion with pain with abduction and external rotation. Decreased strength in abduction.  Lab and Radiology Results  Procedure: Real-time Ultrasound Guided Injection of right shoulder glenohumeral joint posterior approach Device: Philips Affiniti 50G Images permanently stored and available for review in PACS Verbal informed consent obtained.  Discussed risks and benefits of procedure. Warned about infection, bleeding, hyperglycemia damage to structures among others. Patient expresses understanding and agreement Time-out conducted.   Noted no overlying erythema, induration, or other signs of local infection.   Skin prepped in a sterile fashion.   Local anesthesia: Topical Ethyl chloride.   With sterile technique and under real time ultrasound guidance: 40 mg of  Kenalog and 2 mL of Marcaine injected into glenohumeral joint. Fluid seen entering the joint capsule.   Completed without difficulty   Pain immediately resolved suggesting accurate placement of the medication.   Advised to call if fevers/chills, erythema, induration, drainage, or persistent bleeding.   Images permanently stored and available for review in the ultrasound unit.  Impression: Technically successful ultrasound guided injection.    Procedure: Real-time Ultrasound Guided Injection of left shoulder glenohumeral joint posterior approach Device: Philips Affiniti 50G Images permanently stored and available for review in PACS Verbal informed consent obtained.  Discussed risks and benefits of procedure. Warned about infection, bleeding, hyperglycemia damage to structures among others. Patient expresses understanding and agreement Time-out conducted.   Noted no overlying erythema, induration, or other signs of local infection.   Skin prepped in a sterile fashion.   Local anesthesia: Topical Ethyl chloride.   With sterile technique and under real time ultrasound guidance: 40 mg of Kenalog and 2 mL of Marcaine injected into the joint capsule. Fluid seen entering the shoulder joint.   Completed without difficulty   Pain immediately resolved suggesting accurate placement of the medication.   Advised to call if fevers/chills, erythema, induration, drainage, or persistent bleeding.   Images permanently stored and available for review in the ultrasound unit.  Impression: Technically successful ultrasound guided injection.    X-ray images bilateral shoulders obtained today personally and independently interpreted  Right shoulder: No aggressive appearing bony lesions right shoulder. No severe glenohumeral DJD.  Moderate AC DJD.  Left shoulder: Abnormal appearance of glenoid.  No severe glenohumeral DJD.  No aggressive appearing bony lesions.  Await  formal radiology  review    Impression and Recommendations:    Assessment and Plan: 71 y.o. female with bilateral shoulder pain.  Etiology is somewhat unclear.  Proceed with glenohumeral injections bilaterally which did provide some relief in clinic.  Suspect she may also have some other pain generators including potential subacromial impingement and bursitis.  If this is not sufficient would consider subacromial injection bilaterally.  Recheck back as needed.Marland Kitchen  PDMP not reviewed this encounter. Orders Placed This Encounter  Procedures   [place] LIMITED JOINT SPACE STRUCTURES UP BILAT(NO LINKED CHARGES)    Order Specific Question:   Reason for Exam (SYMPTOM  OR DIAGNOSIS REQUIRED)    Answer:   B shoulder pain    Order Specific Question:   Preferred imaging location?    Answer:   [facility]   DG Shoulder Left    Standing Status:   Future    Number of Occurrences:   1    Standing Expiration Date:   01/10/2022    Order Specific Question:   Reason for Exam (SYMPTOM  OR DIAGNOSIS REQUIRED)    Answer:   L shoulder pain    Order Specific Question:   Preferred imaging location?    Answer:   [facility]   DG Shoulder Right    Standing Status:   Future    Number of Occurrences:   1    Standing Expiration Date:   01/10/2022    Order Specific Question:   Reason for Exam (SYMPTOM  OR DIAGNOSIS REQUIRED)    Answer:   R shoulder pain    Order Specific Question:   Preferred imaging location?    Answer:   [facility]   No orders of the defined types were placed in this encounter.   Discussed warning signs or symptoms. Please see discharge instructions. Patient expresses understanding.   The above documentation has been reviewed and is accurate and complete Lynne Leader, M.D.

## 2021-12-11 NOTE — Patient Instructions (Addendum)
Nice to meet you today.  You had B shoulder injections.  Call or go to the ER if you develop a large red swollen joint with extreme pain or oozing puss.   Please get an Xray today before you leave.  Follow-up: as needed

## 2021-12-11 NOTE — Assessment & Plan Note (Signed)
Chronic Sleeping very well Currently taking Remeron 45 mg nightly-we will try decreasing to 30 mg to see if that improves her energy level during the day

## 2021-12-11 NOTE — Assessment & Plan Note (Signed)
Increased fatigue recently that is likely multifactorial Treatment for her cancer is likely contributing-did finish radiation for brain mets and mets likely contributing Medication may also be contributing-we will make some adjustments today Gust that we can make further medication adjustments in the near future to see if that helps Will rule out a UTI given mild confusion and urinary frequency

## 2021-12-12 ENCOUNTER — Telehealth: Payer: Self-pay

## 2021-12-12 ENCOUNTER — Encounter: Payer: Self-pay | Admitting: Internal Medicine

## 2021-12-12 LAB — URINE CULTURE

## 2021-12-12 NOTE — Telephone Encounter (Signed)
Called and talked to patient's daughter about a message she had sent regarding her mother's last appointment. The daughter wanted to know if there were going to be more treatments and what the next steps were. I explained to the daughter that a repeat scan was scheduled and that'll give the doctor's more information on if the treatment plan needs to be adjusted and what the next steps will be. The daughter verbalized understanding of this and had no further questions or concerns.

## 2021-12-12 NOTE — Progress Notes (Signed)
Left shoulder x-ray shows medium arthritis changes

## 2021-12-12 NOTE — Progress Notes (Signed)
Right shoulder x-ray shows medium arthritis changes.

## 2021-12-14 ENCOUNTER — Other Ambulatory Visit: Payer: Self-pay | Admitting: Physician Assistant

## 2021-12-14 DIAGNOSIS — C3411 Malignant neoplasm of upper lobe, right bronchus or lung: Secondary | ICD-10-CM

## 2021-12-16 ENCOUNTER — Inpatient Hospital Stay: Payer: Medicare HMO

## 2021-12-17 ENCOUNTER — Encounter: Payer: Self-pay | Admitting: Urology

## 2021-12-18 ENCOUNTER — Encounter: Payer: Self-pay | Admitting: Urology

## 2021-12-18 NOTE — Progress Notes (Signed)
Telephone appointment. I spoke w/ patient's daughter Ms. [name] Thronton, verified her identity and began nursing interview. Daughter reports occasional, sharp headache 4/10. No other issues reported at this time  Meaningful use complete.  Reminded patient's daughter of her mom's 10:00am-12/19/21 telephone appointment w/ Ashlyn Bruning PA-C. I left my extension [phone] in case patient needs anything. Daughter verbalized understanding.  Patient contact [phone]

## 2021-12-19 ENCOUNTER — Ambulatory Visit: Payer: Medicare HMO | Admitting: Internal Medicine

## 2021-12-19 ENCOUNTER — Other Ambulatory Visit: Payer: Self-pay | Admitting: Internal Medicine

## 2021-12-19 ENCOUNTER — Ambulatory Visit
Admission: RE | Admit: 2021-12-19 | Discharge: 2021-12-19 | Disposition: A | Discharge status: Home / Self Care | Payer: Medicare HMO | Source: Ambulatory Visit | Attending: Urology | Admitting: Urology

## 2021-12-19 DIAGNOSIS — C3490 Malignant neoplasm of unspecified part of unspecified bronchus or lung: Secondary | ICD-10-CM

## 2021-12-19 NOTE — Progress Notes (Signed)
Radiation Oncology         [phone] ________________________________  Name: Hannah [name] MRN: [id number]  Date: 12/19/2021  DOB: 1951/05/11  Post Treatment Note  CC: [name], MD  [name], MD  Diagnosis:   71 yo woman with brain metastasis from Stage IV non-small cell lung cancer (T3, N0, M1C) adenocarcinoma of the right upper lung.   Interval Since Last Radiation:  1 month 11/07/21 - 11/18/21: Fractionated SRS// The target was treated to  27.5 Gy in 5 fractions of 5.5 Gy.   11/29/20//salvage SRS: The following brain targets were treated to a prescription dose of 20 Gy in a single fraction:    08/23/20//pre-op SRS:  PTV1: The Left frontal 2.5 cm target was treated to a prescription dose of 18 Gy in a single fraction (Pre-op).   Narrative:  I spoke with the patient and her daughter, Hannah [name], to conduct her routine scheduled 1 month follow up visit via telephone to spare the patient unnecessary potential exposure in the healthcare setting during the current COVID-19 pandemic.     In brief summary, she initially presented to her PCP in late Jan. 2022 with complaints of several days of rapidly progressive speech impairment and right sided weakness. She and her family described difficulty putting complete sentences together, impaired use of the right arm, and dragging of the right leg while walking.  Her PCP, Dr. Quay Burow ordered imaging, initially with a noncontrast brain MRI performed on 07/31/2020 which showed a 1.5 cm left frontal lobe lesion with prominent surrounding edema and mass-effect with a 3 mm rightward midline shift.   Brain MRI with contrast on 08/02/20 demonstrated a lobulated left frontal lesion with necrotic center and irregular peripheral contrast enhancement measuring approximately 2.2 x 1.8 x 1.4 cm, with surrounding edema and 3 to 4 mm rightward midline shift.  No additional brain lesions were noted.   She was started on Decadron at $RemoveBef'4mg'TEOZcIZtBN$  twice per day and did notice  improvement with regards to the right sided weakness and speech. She met with Dr. Mickeal Skinner on 08/06/20 and the Decadron dose was lowered to $RemoveBe'4mg'MSZfRDRhc$  daily, starting on 08/08/20, which she tolerated well.   A CT C/A/P was obtained on 08/02/20 to assess for a potential primary source of the apparent brain metastasis since she had no prior history of cancer to her knowledge.  The chest imaging revealed an irregular, solid 5.4 x 4.4 cm peripheral right upper lobe lung mass, consistent with bronchogenic carcinoma.  There were no enlarged mediastinal nodes or other evidence of metastatic cancer in the chest, abdomen or pelvis.  A PET scan on 08/08/20 confirmed the hypermetabolic mass in the right upper lobe but no other sites of active disease.     We met with the patient and her daughter, Hannah [name], on 08/09/20 to discuss radiation options for management of the brain disease. Her case was discussed in multidisciplinary brain conference and the consensus recommendation was to proceed with pre-op SRS followed by surgical resection which she was in agreement with. She had a single fraction of SRS to the solitary met in the left frontal lobe on 08/23/20 which was tolerated well and then proceeded to surgical resection, under the care of Dr. Zada Finders on 08/24/20.  She did well with surgery and was able to go home the following day. Final surgical pathology showed a poorly differentiated carcinoma consistent with a lung primary.  She met with Dr. Roxan Hockey on 09/04/20 to discuss robotic assisted thorascopy-right upper  lobectomy with en bloc wedge resection of the right middle lobe and node resection which was performed on 09/24/20. 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  She met back with Dr. Julien Nordmann on 10/23/20 and the recommendation was to proceed with adjuvant systemic chemotherapy with combination chemo-immunotherapy  which she started 11/07/20 with carboplatin, Alimta and Keytruda 200 mg IV every 3 weeks. Starting from cycle #5, the carboplatin was discontinued and she continued on maintenance therapy with Alimta and Keytruda.        On her follow up/surveillance MRI brain on 11/16/20 there were two new subcentimeter metastases in the bilateral cerebrum with a 72mm right low parietal lesion and a 4 mm left parietal lesion. Evolving left frontal treatment site without findings worrisome for residual disease.  These images were reviewed in the multidisciplinary brain conference on 11/19/20 and consensus recommendation was to proceed with a single fraction of SRS to treat the two new lesions. She and her daughter, Hannah [name] were in agreement to proceed and the [place] treatment was performed on 11/29/20 and tolerated very well.   She continued in surveillance with serial MRI brain scans under the care of Dr. Mickeal Skinner and without any concerning findings for disease progression or recurrence until her scan in 06/2021 showed some progressive enhancement and vasogenic edema at the previously treated left parietal metastasis site. This was reviewed in the mutlidisciplinary brain tumor conference and the consensus opinion was that this was most likely treatment effect/pseudoprogression versus true progression of disease. She had a repeat MRI brain scan on 09/20/21 as recommended and this showed significant progressive enhancement and edema at the treated left parietal metastasis with the enhancing area measuring up to 2 cm in size on this exam. She was not having any progressive neurologic symptoms fortunately.  After reviewing her case in the brain tumor board, the recommendation was for consideration of LITT versus surgical resection of the previously treated left parietal lesion and the patient preferred to pursue evaluation for LITT so she was referred to Dr. Brett Albino at [facility].  She met with Dr. Brett Albino in consultation on 10/04/2021 who recommended  stereotactic biopsy with LITT.  This procedure was performed on 10/14/2021 and final pathology did return confirming residual metastatic carcinoma, consistent with the patient's known lung carcinoma.   Her case and recent pathology results were again reviewed in the multidisciplinary brain tumor conference on Monday, 10/21/2021 with consensus recommendation for a 5 fraction course of postoperative stereotactic radiotherapy to the LITT surgical cavity.  She was in agreement to proceed and these treatments were completed on 11/18/2021 and tolerated very well.  Interval history:   Since we saw her last, she has completed and tolerated very well, a 5 fraction course of postoperative stereotactic radiosurgery to the LITT surgical cavity.  She reports occasional headaches, rated 4 out of 10 on the pain scale, that respond to Tylenol as needed.  She denies any changes in her visual or auditory acuity, difficulty with speech, imbalance, focal weakness, tremor or seizure activity.  She has continued with generalized weakness/fatigue which has been persistent since the time of her LITT procedure.  She has continued on maintenance systemic therapy with Keytruda/Alimta and is scheduled for restaging CT C/A/P on 12/24/2021 prior to her next follow-up visit with Dr. Julien Nordmann on 12/26/2021.  She also had a follow-up visit scheduled with Dr. Mickeal Skinner on 12/26/2021.  On review of systems, the  patient states that she is doing well in general.  She and her daughter feel like she tolerated her recent postoperative fractionated SRS treatments well and has recovered well.  She has remained off dexamethasone, at the instruction of Dr. Brett Albino following her LITT procedure.  She has continued with occasional, intermittent headaches that have not worsened and are mild and resolve with Tylenol/Advil prn. She denies any changes in visual or auditory acuity, progressive focal weakness, paraesthesias, difficulty with speech, imbalance, tremor or  seizure activity. She denies any fever, chills, night sweats, or weight loss. She denies any increased shortness of breath, cough, or chest pain. She sometimes has chest discomfort over her right ribs at the site of her prior surgery but this is mild and not progressive. She she denies any nausea, vomiting, diarrhea, or constipation and she is maintaining her weight. Her energy is improving and overall, she is pleased with her progress.  ALLERGIES:  is allergic to amlodipine, chantix [varenicline tartrate], clarithromycin, lisinopril, simvastatin, wellbutrin [bupropion hcl], lipitor [atorvastatin], and sertraline.  Meds: Current Outpatient Medications  Medication Sig Dispense Refill   acetaminophen (TYLENOL) 325 MG tablet Take 325 mg by mouth every 6 (six) hours as needed for moderate pain.     ALPRAZolam (XANAX XR) 0.5 MG 24 hr tablet Take one tablet (0.5 mg total) by mouth every night at bedtime. 30 tablet 5   Ascorbic Acid (VITAMIN C) 1000 MG tablet Take 1,000 mg by mouth daily.     aspirin 81 MG tablet Take 1 tablet (81 mg total) by mouth daily. Restart on 08/31/20 30 tablet    bisacodyl (DULCOLAX) 5 MG EC tablet Take 5 mg by mouth daily as needed for moderate constipation.     carboxymethylcellulose (REFRESH PLUS) 0.5 % SOLN Place 1-2 drops into both eyes 3 (three) times daily as needed (dry eyes).     Cholecalciferol (D3 PO) Take 1 capsule by mouth daily.     citalopram (CELEXA) 10 MG tablet Take 1 tablet (10 mg total) by mouth daily. 90 tablet 1   dexamethasone (DECADRON) 1 MG tablet Take 1 tablet (1 mg total) by mouth daily. 60 tablet 1   fluticasone (FLONASE) 50 MCG/ACT nasal spray PLACE 2 SPRAYS INTO BOTH NOSTRILS DAILY. (Patient taking differently: Place 2 sprays into both nostrils daily as needed for allergies.) 48 g 1   folic acid (FOLVITE) 1 MG tablet Take 1 tablet by mouth once daily 30 tablet 0   Lacosamide 100 MG TABS Take 1 tablet by mouth twice daily 60 tablet 0   levETIRAcetam  (KEPPRA) 100 MG/ML solution TAKE 15 ML BY MOUTH  TWICE DAILY 473 mL 0   loratadine (CLARITIN) 10 MG tablet Take 10 mg by mouth daily as needed for allergies.     magnesium hydroxide (MILK OF MAGNESIA) 800 MG/5ML suspension Take 30 mLs by mouth daily as needed for constipation.     Menthol, Topical Analgesic, (BIOFREEZE EX) Apply 1 application topically as needed (pain).     mirtazapine (REMERON) 30 MG tablet Take 1 tablet (30 mg total) by mouth at bedtime. 30 tablet 5   olopatadine (PATANOL) 0.1 % ophthalmic solution Place 1 drop into both eyes 2 (two) times daily as needed for allergies.     omeprazole (PRILOSEC) 40 MG capsule TAKE 1 CAPSULE TWICE DAILY BEFORE MEALS (Patient taking differently: Take 40 mg by mouth daily.) 180 capsule 0   oxyCODONE (OXY IR/ROXICODONE) 5 MG immediate release tablet Take 5 mg by mouth every 8 (eight)  hours as needed.     polyethylene glycol (MIRALAX / GLYCOLAX) 17 g packet Take 17 g by mouth 2 (two) times daily. 72 each 0   pravastatin (PRAVACHOL) 40 MG tablet TAKE 1 TABLET (40 MG TOTAL) BY MOUTH EVERY EVENING. (Patient taking differently: Take 40 mg by mouth daily.) 90 tablet 0   Probiotic Product (PROBIOTIC DAILY PO) Take 1 capsule by mouth daily.     prochlorperazine (COMPAZINE) 10 MG tablet TAKE 1 TABLET BY MOUTH EVERY 6 HOURS AS NEEDED FOR NAUSEA OR VOMITING 30 tablet 0   simethicone (MYLICON) 80 MG chewable tablet Chew 1 tablet (80 mg total) by mouth 4 (four) times daily as needed for flatulence (Bloating). 30 tablet 0   No current facility-administered medications for this encounter.    Physical Findings:  vitals were not taken for this visit.  Pain Assessment Pain Score: 4  (Headache)/Unable to assess due to telephone follow up visit format.  Lab Findings: Lab Results  Component Value Date   WBC 4.6 12/05/2021   HGB 11.4 (L) 12/05/2021   HCT 36.0 12/05/2021   MCV 90.5 12/05/2021   PLT 344 12/05/2021     Radiographic Findings: DG Shoulder  Right  Result Date: 12/12/2021 CLINICAL DATA:  Right shoulder pain.  No reported injury. EXAM: RIGHT SHOULDER - 2+ VIEW COMPARISON:  Right clavicle radiographs dated 09/12/2019. FINDINGS: Mild-to-moderate right glenohumeral spur formation and right greater tuberosity hyperostosis. No fracture or dislocation. IMPRESSION: Mild-to-moderate degenerative spur formation and hyperostosis. Electronically Signed   By: Claudie Revering M.D.   On: 12/12/2021 11:43   DG Shoulder Left  Result Date: 12/12/2021 CLINICAL DATA:  Left shoulder pain.  No reported injury. EXAM: LEFT SHOULDER - 2+ VIEW COMPARISON:  Chest radiograph dated 10/27/2021 and chest CT dated 10/22/2021. FINDINGS: Mild-to-moderate left glenohumeral and acromioclavicular degenerative spur formation. No fracture or dislocation. Thoracic spine degenerative changes. IMPRESSION: Mild to moderate left shoulder degenerative changes. Electronically Signed   By: Claudie Revering M.D.   On: 12/12/2021 11:41     Impression/Plan: 82.  71 yo woman with brain metastasis from Stage IV non-small cell lung cancer (T3, N0, M1C) adenocarcinoma of the right upper lung.  She appears to have recovered well from the effects of her recent fractionated SRS treatments and is currently without complaints aside from persistent generalized fatigue/weakness.  She has continued taking Keppra daily as prescribed but wonders if this might potentially be contributing to her persistent fatigue/malaise.  She will discuss this further at her upcoming visit with Dr. Mickeal Skinner on 12/26/2021.  She continues to tolerate the systemic therapy well and will have disease restaging imaging with a repeat CT C/A/P on 12/24/2021, prior to her next follow-up visit with Dr. Earlie Server on 12/26/2021.  We will coordinate for a posttreatment MRI brain scan in August 2023, to assess her response to treatment and continue to monitor for any evidence of disease progression or recurrence.  She will likely continue her  follow-up regarding the brain disease, under the care and direction of Dr. Mickeal Skinner but I assured them that we will continue to participate in the review of her scans in the multidisciplinary brain conference and are more than happy to continue to participate in her care if clinically indicated in the future but at this time, we will plan to see her back on an as-needed basis.  She and her daughter appear to have a good understanding of these recommendations and are comfortable and in agreement with the stated plan.  They  know they are welcome to call at anytime with any questions or concerns related to her previous radiation.     Nicholos Johns, PA-C

## 2021-12-24 ENCOUNTER — Ambulatory Visit
Admission: RE | Admit: 2021-12-24 | Discharge: 2021-12-24 | Disposition: A | Discharge status: Home / Self Care | Payer: Medicare HMO | Source: Ambulatory Visit | Attending: Physician Assistant | Admitting: Physician Assistant

## 2021-12-24 DIAGNOSIS — C3411 Malignant neoplasm of upper lobe, right bronchus or lung: Secondary | ICD-10-CM

## 2021-12-24 DIAGNOSIS — J432 Centrilobular emphysema: Secondary | ICD-10-CM | POA: Diagnosis not present

## 2021-12-24 DIAGNOSIS — C3490 Malignant neoplasm of unspecified part of unspecified bronchus or lung: Secondary | ICD-10-CM | POA: Diagnosis not present

## 2021-12-24 DIAGNOSIS — J90 Pleural effusion, not elsewhere classified: Secondary | ICD-10-CM | POA: Diagnosis not present

## 2021-12-24 DIAGNOSIS — K8020 Calculus of gallbladder without cholecystitis without obstruction: Secondary | ICD-10-CM | POA: Diagnosis not present

## 2021-12-24 DIAGNOSIS — N8189 Other female genital prolapse: Secondary | ICD-10-CM | POA: Diagnosis not present

## 2021-12-24 DIAGNOSIS — I700 Atherosclerosis of aorta: Secondary | ICD-10-CM | POA: Diagnosis not present

## 2021-12-24 DIAGNOSIS — Z9889 Other specified postprocedural states: Secondary | ICD-10-CM | POA: Diagnosis not present

## 2021-12-24 MED ORDER — IOPAMIDOL (ISOVUE-300) INJECTION 61%
100.0000 mL | Freq: Once | INTRAVENOUS | Status: AC | PRN
  Administered 2021-12-24: 100 mL via INTRAVENOUS

## 2021-12-24 NOTE — Progress Notes (Signed)
I, Hannah [name], LAT, ATC, am serving as scribe for Hannah [name].  Hannah [name] is a 71 y.o. female who presents to [place] [facility] today for f/u of B shoulder pain.  She was last seen by Hannah [name] on 12/11/21 and had B GHJ steroid injections.  She has a hx of lung CA that metatastisized to her brain.  Today, pt reports that her B shoulder pain is better than it was prior to her GHJ injections but feels like the pain is returning.  She notes that both shoulders are evenly painful.  Diagnostic testing: R and L shoulder XR- 12/11/21  Pertinent review of systems: No fevers or chills  Relevant historical information: Lung cancer.   Exam:  BP 118/70 (BP Location: Right Arm, Patient Position: Sitting, Cuff Size: Normal)   Pulse 79   Ht 5' (1.524 m)   Wt 135 lb 9.6 oz (61.5 kg)   SpO2 96%   BMI 26.48 kg/m  General: Well Developed, well nourished, and in no acute distress.   MSK: Shoulders bilaterally normal. Decreased range of motion pain with abduction.    Lab and Radiology Results  Procedure: Real-time Ultrasound Guided Injection of left shoulder subacromial bursa Device: Philips Affiniti 50G Images permanently stored and available for review in PACS Verbal informed consent obtained.  Discussed risks and benefits of procedure. Warned about infection, bleeding, hyperglycemia damage to structures among others. Patient expresses understanding and agreement Time-out conducted.   Noted no overlying erythema, induration, or other signs of local infection.   Skin prepped in a sterile fashion.   Local anesthesia: Topical Ethyl chloride.   With sterile technique and under real time ultrasound guidance: 40 mg of Kenalog and 2 mL of Marcaine injected into subacromial bursa. Fluid seen entering the bursa.   Completed without difficulty   Pain immediately resolved suggesting accurate placement of the medication.   Advised to call if fevers/chills, erythema,  induration, drainage, or persistent bleeding.   Images permanently stored and available for review in the ultrasound unit.  Impression: Technically successful ultrasound guided injection.   Procedure: Real-time Ultrasound Guided Injection of the right shoulder subacromial bursa Device: Philips Affiniti 50G Images permanently stored and available for review in PACS Verbal informed consent obtained.  Discussed risks and benefits of procedure. Warned about infection, bleeding, hyperglycemia damage to structures among others. Patient expresses understanding and agreement Time-out conducted.   Noted no overlying erythema, induration, or other signs of local infection.   Skin prepped in a sterile fashion.   Local anesthesia: Topical Ethyl chloride.   With sterile technique and under real time ultrasound guidance: 40 mg of Kenalog and 2 mL of Marcaine injected into subacromial bursa. Fluid seen entering the bursa.   Completed without difficulty   Pain immediately resolved suggesting accurate placement of the medication.   Advised to call if fevers/chills, erythema, induration, drainage, or persistent bleeding.   Images permanently stored and available for review in the ultrasound unit.  Impression: Technically successful ultrasound guided injection.    : RIGHT SHOULDER - 2+ VIEW   COMPARISON:  Right clavicle radiographs dated 09/12/2019.   FINDINGS: Mild-to-moderate right glenohumeral spur formation and right greater tuberosity hyperostosis. No fracture or dislocation.   IMPRESSION: Mild-to-moderate degenerative spur formation and hyperostosis.     Electronically Signed   By: Hannah [name] M.D.   On: 12/12/2021 11:43    EXAM: LEFT SHOULDER - 2+ VIEW   COMPARISON:  Chest radiograph dated 10/27/2021  and chest CT dated 10/22/2021.   FINDINGS: Mild-to-moderate left glenohumeral and acromioclavicular degenerative spur formation. No fracture or dislocation. Thoracic spine  degenerative changes.   IMPRESSION: Mild to moderate left shoulder degenerative changes.     Electronically Signed   By: Hannah [name] M.D.   On: 12/12/2021 11:41  I, Hannah [name], personally (independently) visualized and performed the interpretation of the images attached in this note.    Assessment and Plan: 71 y.o. female with bilateral shoulder pain thought to be due to subacromial bursitis. Hannah [name] did not have great response to glenohumeral injections about 2 weeks ago.  Proceeded to subacromial injections today which worked a lot better immediately indicating this is probably her pain generator.  Check back with me as needed.  She is currently receiving active treatment for lung cancer and I would like to minimize this many encounter she has with healthcare in addition to her oncology burden if possible.  Recheck with me as needed   PDMP not reviewed this encounter. Orders Placed This Encounter  Procedures   [place] LIMITED JOINT SPACE STRUCTURES UP BILAT(NO LINKED CHARGES)    Order Specific Question:   Reason for Exam (SYMPTOM  OR DIAGNOSIS REQUIRED)    Answer:   B shoulder pain    Order Specific Question:   Preferred imaging location?    Answer:   [place]   No orders of the defined types were placed in this encounter.    Discussed warning signs or symptoms. Please see discharge instructions. Patient expresses understanding.   The above documentation has been reviewed and is accurate and complete Hannah [name], M.D.

## 2021-12-25 ENCOUNTER — Encounter: Payer: Self-pay | Admitting: Family Medicine

## 2021-12-25 ENCOUNTER — Ambulatory Visit: Payer: Self-pay

## 2021-12-25 ENCOUNTER — Ambulatory Visit (INDEPENDENT_AMBULATORY_CARE_PROVIDER_SITE_OTHER): Payer: Medicare HMO | Admitting: Family Medicine

## 2021-12-25 VITALS — BP 118/70 | HR 79 | Ht 60.0 in | Wt 135.6 lb

## 2021-12-25 DIAGNOSIS — M25512 Pain in left shoulder: Secondary | ICD-10-CM | POA: Diagnosis not present

## 2021-12-25 DIAGNOSIS — M25511 Pain in right shoulder: Secondary | ICD-10-CM | POA: Diagnosis not present

## 2021-12-25 DIAGNOSIS — G8929 Other chronic pain: Secondary | ICD-10-CM | POA: Diagnosis not present

## 2021-12-25 NOTE — Patient Instructions (Signed)
Good to see you today.  You had B shoulder (subacromial) injections.  Call or go to the ER if you develop a large red swollen joint with extreme pain or oozing puss.   Follow-up as needed.

## 2021-12-26 ENCOUNTER — Inpatient Hospital Stay: Payer: Medicare HMO | Admitting: Internal Medicine

## 2021-12-26 ENCOUNTER — Ambulatory Visit: Payer: Medicare HMO

## 2021-12-26 ENCOUNTER — Inpatient Hospital Stay: Payer: Medicare HMO

## 2021-12-26 ENCOUNTER — Other Ambulatory Visit: Payer: Self-pay

## 2021-12-26 VITALS — BP 140/77 | HR 75 | Temp 98.2°F | Resp 16 | Ht 60.0 in | Wt 137.2 lb

## 2021-12-26 DIAGNOSIS — C7931 Secondary malignant neoplasm of brain: Secondary | ICD-10-CM | POA: Diagnosis not present

## 2021-12-26 DIAGNOSIS — C3490 Malignant neoplasm of unspecified part of unspecified bronchus or lung: Secondary | ICD-10-CM | POA: Diagnosis not present

## 2021-12-26 DIAGNOSIS — F419 Anxiety disorder, unspecified: Secondary | ICD-10-CM | POA: Diagnosis not present

## 2021-12-26 DIAGNOSIS — E785 Hyperlipidemia, unspecified: Secondary | ICD-10-CM | POA: Diagnosis not present

## 2021-12-26 DIAGNOSIS — C3411 Malignant neoplasm of upper lobe, right bronchus or lung: Secondary | ICD-10-CM

## 2021-12-26 DIAGNOSIS — Z5111 Encounter for antineoplastic chemotherapy: Secondary | ICD-10-CM

## 2021-12-26 DIAGNOSIS — I6782 Cerebral ischemia: Secondary | ICD-10-CM | POA: Diagnosis not present

## 2021-12-26 DIAGNOSIS — Z5112 Encounter for antineoplastic immunotherapy: Secondary | ICD-10-CM

## 2021-12-26 DIAGNOSIS — F32A Depression, unspecified: Secondary | ICD-10-CM | POA: Diagnosis not present

## 2021-12-26 DIAGNOSIS — K219 Gastro-esophageal reflux disease without esophagitis: Secondary | ICD-10-CM | POA: Diagnosis not present

## 2021-12-26 LAB — CMP (CANCER CENTER ONLY)
ALT: 17 U/L (ref 0–44)
AST: 21 U/L (ref 15–41)
Albumin: 4 g/dL (ref 3.5–5.0)
Alkaline Phosphatase: 85 U/L (ref 38–126)
Anion gap: 7 (ref 5–15)
BUN: 12 mg/dL (ref 8–23)
CO2: 28 mmol/L (ref 22–32)
Calcium: 9.8 mg/dL (ref 8.9–10.3)
Chloride: 106 mmol/L (ref 98–111)
Creatinine: 0.69 mg/dL (ref 0.44–1.00)
GFR, Estimated: >60 mL/min (ref >60)
Glucose, Bld: 124 mg/dL — ABNORMAL HIGH (ref 70–99)
Potassium: 4 mmol/L (ref 3.5–5.1)
Sodium: 141 mmol/L (ref 135–145)
Total Bilirubin: 0.2 mg/dL — ABNORMAL LOW (ref 0.3–1.2)
Total Protein: 6.9 g/dL (ref 6.5–8.1)

## 2021-12-26 LAB — CBC WITH DIFFERENTIAL (CANCER CENTER ONLY)
Abs Immature Granulocytes: 0.03 10*3/uL (ref 0.00–0.07)
Basophils Absolute: 0 10*3/uL (ref 0.0–0.1)
Basophils Relative: 0 %
Eosinophils Absolute: 0 10*3/uL (ref 0.0–0.5)
Eosinophils Relative: 0 %
HCT: 35 % — ABNORMAL LOW (ref 36.0–46.0)
Hemoglobin: 11 g/dL — ABNORMAL LOW (ref 12.0–15.0)
Immature Granulocytes: 0 %
Lymphocytes Relative: 11 %
Lymphs Abs: 0.8 10*3/uL (ref 0.7–4.0)
MCH: 28.3 pg (ref 26.0–34.0)
MCHC: 31.4 g/dL (ref 30.0–36.0)
MCV: 90 fL (ref 80.0–100.0)
Monocytes Absolute: 0.5 10*3/uL (ref 0.1–1.0)
Monocytes Relative: 7 %
Neutro Abs: 6 10*3/uL (ref 1.7–7.7)
Neutrophils Relative %: 82 %
Platelet Count: 296 10*3/uL (ref 150–400)
RBC: 3.89 MIL/uL (ref 3.87–5.11)
RDW: 13.5 % (ref 11.5–15.5)
WBC Count: 7.4 10*3/uL (ref 4.0–10.5)
nRBC: 0 % (ref 0.0–0.2)

## 2021-12-26 LAB — TSH: TSH: 0.344 u[IU]/mL — ABNORMAL LOW (ref 0.350–4.500)

## 2021-12-26 MED ORDER — SODIUM CHLORIDE 0.9 % IV SOLN
200.0000 mg | Freq: Once | INTRAVENOUS | Status: AC
  Administered 2021-12-26: 200 mg via INTRAVENOUS
  Filled 2021-12-26: qty 200

## 2021-12-26 MED ORDER — SODIUM CHLORIDE 0.9 % IV SOLN: Freq: Once | INTRAVENOUS | Status: DC

## 2021-12-26 MED ORDER — SODIUM CHLORIDE 0.9 % IV SOLN: Freq: Once | INTRAVENOUS | Status: AC

## 2021-12-26 MED ORDER — LACOSAMIDE 100 MG PO TABS: 1.0000 | ORAL_TABLET | Freq: Two times a day (BID) | ORAL | 3 refills | Status: DC

## 2021-12-26 MED ORDER — PROCHLORPERAZINE MALEATE 10 MG PO TABS
10.0000 mg | ORAL_TABLET | Freq: Once | ORAL | Status: AC
  Administered 2021-12-26: 10 mg via ORAL
  Filled 2021-12-26: qty 1

## 2021-12-26 MED ORDER — FOLIC ACID 1 MG PO TABS: 1.0000 mg | ORAL_TABLET | Freq: Every day | ORAL | 3 refills | Status: DC

## 2021-12-26 MED ORDER — SODIUM CHLORIDE 0.9 % IV SOLN
800.0000 mg | Freq: Once | INTRAVENOUS | Status: AC
  Administered 2021-12-26: 800 mg via INTRAVENOUS
  Filled 2021-12-26: qty 20

## 2021-12-26 NOTE — Progress Notes (Signed)
Hannah [name] [phone]   [phone]  OFFICE PROGRESS NOTE  Hannah Rail, MD [place] [address]  DIAGNOSIS: Stage IV non-small cell lung cancer (T, N0, M1C) adenocarcinoma.  The patient presented with a and solitary brain metastasis.  The patient was diagnosed in February 2022.  Biomarker Findings Microsatellite status - MS-Stable Tumor Mutational Burden - 8 Muts/Mb Genomic Findings For a complete list of the genes assayed, please refer to the Appendix. KRAS G12V KEAP1 S224F TP53 P151T 7 Disease relevant genes with no reportable alterations: ALK, BRAF, EGFR, ERBB2, MET, RET, ROS1  PDL1 Expression: 50%    PRIOR THERAPY: 1) SRS to the metastatic brain lesion on 08/23/2020 under the care of Dr. Tammi Klippel and craniotomy under the care of Dr. Zada Finders scheduled for 08/24/2020. 2) S/p robotic assisted right upper lobectomy with en bloc wedge resection of the right middle lobe and lymph node dissection under the care of Dr. Roxan Hockey on September 24, 2020. 3) status post SRS to a new subcentimeter brain lesions under the care of Dr. Lisbeth Renshaw. 4) SRS to brain metastasis.   CURRENT THERAPY: Systemic chemotherapy with carboplatin for AUC of 5, Alimta 500 Mg/M2 and Keytruda 200 mg IV every 3 weeks.  First dose Nov 07, 2020.  Status post 18 cycles.  Starting from cycle #5 the patient will be on maintenance treatment with Alimta and Keytruda every 3 weeks.   INTERVAL HISTORY: Hannah [name] 71 y.o. female returns to the clinic today for follow-up visit accompanied by her daughter.  The patient is feeling fine today with no concerning complaints.  She denied having any chest pain, shortness of breath except with exertion with mild cough and no hemoptysis.  She has no nausea, vomiting, diarrhea or constipation.  She has no headache or visual changes.  She is a little bit sleepy from the treatment with Keppra.  She denied having any recent  weight loss or night sweats.  The patient has been tolerating her treatment with maintenance Alimta and Keytruda fairly well.  She had repeat CT scan of the chest, abdomen and pelvis performed recently and she is here for evaluation and discussion of her risk her results.  MEDICAL HISTORY: Past Medical History:  Diagnosis Date   Allergy    Anemia    Anxiety    Arthritis    Carpal tunnel syndrome    Constipation    senna C stool softeners help    Depression    Diverticulosis    Dyslipidemia    External hemorrhoids    GERD (gastroesophageal reflux disease)    Heart murmur    mild-moderate AR   Hiatal hernia    Hyperlipidemia    on meds    Hypertension    Internal hemorrhoids    lung ca with brain mets 07/2020   Osteoarthritis    Pre-diabetes    PVD (peripheral vascular disease) (HCC)    moderate carotid disease   RBBB    Smoker    Vocal cord polyps     ALLERGIES:  is allergic to amlodipine, chantix [varenicline tartrate], clarithromycin, lisinopril, simvastatin, wellbutrin [bupropion hcl], lipitor [atorvastatin], and sertraline.  MEDICATIONS:  Current Outpatient Medications  Medication Sig Dispense Refill   acetaminophen (TYLENOL) 325 MG tablet Take 325 mg by mouth every 6 (six) hours as needed for moderate pain.     ALPRAZolam (XANAX XR) 0.5 MG 24 hr tablet Take one tablet (0.5 mg total) by mouth  every night at bedtime. 30 tablet 5   Ascorbic Acid (VITAMIN C) 1000 MG tablet Take 1,000 mg by mouth daily.     aspirin 81 MG tablet Take 1 tablet (81 mg total) by mouth daily. Restart on 08/31/20 30 tablet    bisacodyl (DULCOLAX) 5 MG EC tablet Take 5 mg by mouth daily as needed for moderate constipation.     carboxymethylcellulose (REFRESH PLUS) 0.5 % SOLN Place 1-2 drops into both eyes 3 (three) times daily as needed (dry eyes).     Cholecalciferol (D3 PO) Take 1 capsule by mouth daily.     citalopram (CELEXA) 10 MG tablet Take 1 tablet (10 mg total) by mouth daily. 90 tablet 1    dexamethasone (DECADRON) 1 MG tablet Take 1 tablet (1 mg total) by mouth daily. 60 tablet 1   fluticasone (FLONASE) 50 MCG/ACT nasal spray PLACE 2 SPRAYS INTO BOTH NOSTRILS DAILY. (Patient taking differently: Place 2 sprays into both nostrils daily as needed for allergies.) 48 g 1   folic acid (FOLVITE) 1 MG tablet Take 1 tablet by mouth once daily 30 tablet 0   Lacosamide 100 MG TABS Take 1 tablet by mouth twice daily 60 tablet 0   levETIRAcetam (KEPPRA) 100 MG/ML solution TAKE 15 ML BY MOUTH  TWICE DAILY 473 mL 0   loratadine (CLARITIN) 10 MG tablet Take 10 mg by mouth daily as needed for allergies.     magnesium hydroxide (MILK OF MAGNESIA) 800 MG/5ML suspension Take 30 mLs by mouth daily as needed for constipation.     Menthol, Topical Analgesic, (BIOFREEZE EX) Apply 1 application topically as needed (pain).     mirtazapine (REMERON) 30 MG tablet Take 1 tablet (30 mg total) by mouth at bedtime. 30 tablet 5   olopatadine (PATANOL) 0.1 % ophthalmic solution Place 1 drop into both eyes 2 (two) times daily as needed for allergies.     omeprazole (PRILOSEC) 40 MG capsule TAKE 1 CAPSULE TWICE DAILY BEFORE MEALS (Patient taking differently: Take 40 mg by mouth daily.) 180 capsule 0   oxyCODONE (OXY IR/ROXICODONE) 5 MG immediate release tablet Take 5 mg by mouth every 8 (eight) hours as needed.     polyethylene glycol (MIRALAX / GLYCOLAX) 17 g packet Take 17 g by mouth 2 (two) times daily. 72 each 0   pravastatin (PRAVACHOL) 40 MG tablet TAKE 1 TABLET (40 MG TOTAL) BY MOUTH EVERY EVENING. (Patient taking differently: Take 40 mg by mouth daily.) 90 tablet 0   Probiotic Product (PROBIOTIC DAILY PO) Take 1 capsule by mouth daily.     prochlorperazine (COMPAZINE) 10 MG tablet TAKE 1 TABLET BY MOUTH EVERY 6 HOURS AS NEEDED FOR NAUSEA OR VOMITING 30 tablet 0   simethicone (MYLICON) 80 MG chewable tablet Chew 1 tablet (80 mg total) by mouth 4 (four) times daily as needed for flatulence (Bloating). 30 tablet  0   No current facility-administered medications for this visit.    SURGICAL HISTORY:  Past Surgical History:  Procedure Laterality Date   ABDOMINAL HYSTERECTOMY  2500   APPLICATION OF CRANIAL NAVIGATION N/A 08/24/2020   Procedure: APPLICATION OF CRANIAL NAVIGATION;  Surgeon: Judith Part, MD;  Location: [facility];  Service: Neurosurgery;  Laterality: N/A;   COLONOSCOPY     COLONOSCOPY W/ POLYPECTOMY  2009   CRANIOTOMY Left 08/24/2020   Procedure: Left Craniotomy for Tumor Resection with Brainlab;  Surgeon: Judith Part, MD;  Location: [facility];  Service: Neurosurgery;  Laterality: Left;   HEMORRHOID  SURGERY     INTERCOSTAL NERVE BLOCK Right 09/24/2020   Procedure: INTERCOSTAL NERVE BLOCK;  Surgeon: Melrose Nakayama, MD;  Location: [facility];  Service: Thoracic;  Laterality: Right;   JOINT REPLACEMENT     LYMPH NODE DISSECTION Right 09/24/2020   Procedure: LYMPH NODE DISSECTION;  Surgeon: Melrose Nakayama, MD;  Location: [facility];  Service: Thoracic;  Laterality: Right;   POLYPECTOMY     right total knee arthroplasty     Dr. Emeterio Reeve 06-04-18   [facility]  08/16/2009   TOTAL KNEE ARTHROPLASTY Left 02/13/2014   Procedure: TOTAL KNEE ARTHROPLASTY;  Surgeon: Alta Corning, MD;  Location: [facility];  Service: Orthopedics;  Laterality: Left;   TOTAL KNEE ARTHROPLASTY Right 06/04/2018   Procedure: RIGHT TOTAL KNEE ARTHROPLASTY;  Surgeon: Dorna Leitz, MD;  Location: [facility];  Service: Orthopedics;  Laterality: Right;  Adductor Block   TUBAL LIGATION      REVIEW OF SYSTEMS:  Constitutional: positive for fatigue Eyes: negative Ears, nose, mouth, throat, and face: negative Respiratory: positive for dyspnea on exertion Cardiovascular: negative Gastrointestinal: negative Genitourinary:negative Integument/breast: negative Hematologic/lymphatic: negative Musculoskeletal:negative Neurological: negative Behavioral/Psych: negative Endocrine: negative Allergic/Immunologic: negative    PHYSICAL EXAMINATION: General appearance: alert, cooperative, fatigued, and no distress Head: Normocephalic, without obvious abnormality, atraumatic Neck: no adenopathy, no JVD, supple, symmetrical, trachea midline, and thyroid not enlarged, symmetric, no tenderness/mass/nodules Lymph nodes: Cervical, supraclavicular, and axillary nodes normal. Resp: clear to auscultation bilaterally Back: symmetric, no curvature. ROM normal. No CVA tenderness. Cardio: regular rate and rhythm, S1, S2 normal, no murmur, click, rub or gallop GI: soft, non-tender; bowel sounds normal; no masses,  no organomegaly Extremities: extremities normal, atraumatic, no cyanosis or edema Neurologic: Alert and oriented X 3, normal strength and tone. Normal symmetric reflexes. Normal coordination and gait  ECOG PERFORMANCE STATUS: 1 - Symptomatic but completely ambulatory  Blood pressure 140/77, pulse 75, temperature 98.2 F (36.8 C), temperature source Oral, resp. rate 16, height 5' (1.524 m), weight 137 lb 3.2 oz (62.2 kg), SpO2 100 %.  LABORATORY DATA: Lab Results  Component Value Date   WBC 4.6 12/05/2021   HGB 11.4 (L) 12/05/2021   HCT 36.0 12/05/2021   MCV 90.5 12/05/2021   PLT 344 12/05/2021      Chemistry      Component Value Date/Time   NA 141 12/05/2021 0959   K 4.1 12/05/2021 0959   CL 105 12/05/2021 0959   CO2 30 12/05/2021 0959   BUN 8 12/05/2021 0959   CREATININE 0.72 12/05/2021 0959   CREATININE 0.79 03/14/2020 1016      Component Value Date/Time   CALCIUM 10.0 12/05/2021 0959   ALKPHOS 74 12/05/2021 0959   AST 28 12/05/2021 0959   ALT 18 12/05/2021 0959   BILITOT 0.2 (L) 12/05/2021 0959       RADIOGRAPHIC STUDIES: CT Chest W Contrast  Result Date: 12/24/2021 CLINICAL DATA:  Primary Cancer Type: Lung Imaging Indication: Assess response to therapy Interval therapy since last imaging? Yes Initial Cancer Diagnosis Date: 09/24/2020; Established by: Biopsy-proven Detailed Pathology:  Stage IV non-small cell lung cancer, adenocarcinoma. Primary Tumor location:   Right upper lobe. Brain metastasis. Surgeries: Right upper lobectomy 09/24/2020. Left frontal craniotomy 08/24/2020. Hysterectomy. Chemotherapy: Yes; Ongoing? Yes; Most recent administration: 12/05/2021 Immunotherapy?  Yes; Type: Keytruda; Ongoing? Yes Radiation therapy? Yes Date Range: 11/07/2021 - 11/18/2021; Target: Brain Date Range: 08/23/2020 and 11/29/2020; Target: Brain * Tracking Code: BO * EXAM: CT CHEST, ABDOMEN, AND PELVIS WITH CONTRAST TECHNIQUE: Multidetector  CT imaging of the chest, abdomen and pelvis was performed following the standard protocol during bolus administration of intravenous contrast. RADIATION DOSE REDUCTION: This exam was performed according to the departmental dose-optimization program which includes automated exposure control, adjustment of the mA and/or kV according to patient size and/or use of iterative reconstruction technique. CONTRAST:  178mL ISOVUE-300 IOPAMIDOL (ISOVUE-300) INJECTION 61% COMPARISON:  Most recent CT chest, abdomen and pelvis 10/22/2021. 08/08/2020 PET-CT. FINDINGS: CT CHEST FINDINGS Cardiovascular: Aortic atherosclerosis. Tortuous descending thoracic aorta. Normal heart size, without pericardial effusion. Lad and left circumflex coronary artery calcification. No central pulmonary embolism, on this non-dedicated study. Mediastinum/Nodes: No supraclavicular adenopathy. No mediastinal or hilar adenopathy. Lungs/Pleura: Trace right pleural fluid, increased. Right upper lobectomy. Mild centrilobular and paraseptal emphysema. Mucoid impaction in the anterior left lower lobe. 2-3 mm left upper lobe pulmonary nodule on 77/3 is unchanged. Left lower lobe 2-3 mm nodule on 69/3 is also unchanged. Musculoskeletal: No acute osseous abnormality. CT ABDOMEN PELVIS FINDINGS Hepatobiliary: Normal liver. 8 mm gallstone without acute cholecystitis or biliary duct dilatation. Pancreas: Mild pancreatic  atrophy is within normal variation for age. Spleen: Normal in size, without focal abnormality. Adrenals/Urinary Tract: Normal right adrenal gland. Mild left adrenal thickening is unchanged. Mild renal cortical scarring bilaterally. No hydronephrosis. Normal urinary bladder. Stomach/Bowel: Normal stomach, without wall thickening. Normal colon, appendix, and terminal ileum. Normal small bowel. Vascular/Lymphatic: Aortic and branch vessel atherosclerosis. No abdominopelvic adenopathy. Reproductive: Hysterectomy.  No adnexal mass. Other: Moderate pelvic floor laxity. No significant free fluid. No free intraperitoneal air. No evidence of omental or peritoneal disease. Musculoskeletal: Lipoma within the right iliacus tendon of 2.0 cm on 106/2. Mild right hip osteoarthritis. Probable bone island within the right ischium at 6 mm on 102/2, similar. Posterior element fixation at L3-5. IMPRESSION: 1. Status post right upper lobectomy. No findings of recurrent or metastatic disease in the chest, abdomen, or pelvis. 2. Slight increase in trace right pleural fluid. 3. Similar left-sided pulmonary nodules, favoring a benign etiology. 4. Cholelithiasis 5. Aortic atherosclerosis (ICD10-I70.0), coronary artery atherosclerosis and emphysema (ICD10-J43.9). Electronically Signed   By: Abigail Miyamoto M.D.   On: 12/24/2021 11:35   CT Abdomen Pelvis W Contrast  Result Date: 12/24/2021 CLINICAL DATA:  Primary Cancer Type: Lung Imaging Indication: Assess response to therapy Interval therapy since last imaging? Yes Initial Cancer Diagnosis Date: 09/24/2020; Established by: Biopsy-proven Detailed Pathology: Stage IV non-small cell lung cancer, adenocarcinoma. Primary Tumor location:   Right upper lobe. Brain metastasis. Surgeries: Right upper lobectomy 09/24/2020. Left frontal craniotomy 08/24/2020. Hysterectomy. Chemotherapy: Yes; Ongoing? Yes; Most recent administration: 12/05/2021 Immunotherapy?  Yes; Type: Keytruda; Ongoing? Yes  Radiation therapy? Yes Date Range: 11/07/2021 - 11/18/2021; Target: Brain Date Range: 08/23/2020 and 11/29/2020; Target: Brain * Tracking Code: BO * EXAM: CT CHEST, ABDOMEN, AND PELVIS WITH CONTRAST TECHNIQUE: Multidetector CT imaging of the chest, abdomen and pelvis was performed following the standard protocol during bolus administration of intravenous contrast. RADIATION DOSE REDUCTION: This exam was performed according to the departmental dose-optimization program which includes automated exposure control, adjustment of the mA and/or kV according to patient size and/or use of iterative reconstruction technique. CONTRAST:  111mL ISOVUE-300 IOPAMIDOL (ISOVUE-300) INJECTION 61% COMPARISON:  Most recent CT chest, abdomen and pelvis 10/22/2021. 08/08/2020 PET-CT. FINDINGS: CT CHEST FINDINGS Cardiovascular: Aortic atherosclerosis. Tortuous descending thoracic aorta. Normal heart size, without pericardial effusion. Lad and left circumflex coronary artery calcification. No central pulmonary embolism, on this non-dedicated study. Mediastinum/Nodes: No supraclavicular adenopathy. No mediastinal or hilar adenopathy. Lungs/Pleura: Trace  right pleural fluid, increased. Right upper lobectomy. Mild centrilobular and paraseptal emphysema. Mucoid impaction in the anterior left lower lobe. 2-3 mm left upper lobe pulmonary nodule on 77/3 is unchanged. Left lower lobe 2-3 mm nodule on 69/3 is also unchanged. Musculoskeletal: No acute osseous abnormality. CT ABDOMEN PELVIS FINDINGS Hepatobiliary: Normal liver. 8 mm gallstone without acute cholecystitis or biliary duct dilatation. Pancreas: Mild pancreatic atrophy is within normal variation for age. Spleen: Normal in size, without focal abnormality. Adrenals/Urinary Tract: Normal right adrenal gland. Mild left adrenal thickening is unchanged. Mild renal cortical scarring bilaterally. No hydronephrosis. Normal urinary bladder. Stomach/Bowel: Normal stomach, without wall thickening.  Normal colon, appendix, and terminal ileum. Normal small bowel. Vascular/Lymphatic: Aortic and branch vessel atherosclerosis. No abdominopelvic adenopathy. Reproductive: Hysterectomy.  No adnexal mass. Other: Moderate pelvic floor laxity. No significant free fluid. No free intraperitoneal air. No evidence of omental or peritoneal disease. Musculoskeletal: Lipoma within the right iliacus tendon of 2.0 cm on 106/2. Mild right hip osteoarthritis. Probable bone island within the right ischium at 6 mm on 102/2, similar. Posterior element fixation at L3-5. IMPRESSION: 1. Status post right upper lobectomy. No findings of recurrent or metastatic disease in the chest, abdomen, or pelvis. 2. Slight increase in trace right pleural fluid. 3. Similar left-sided pulmonary nodules, favoring a benign etiology. 4. Cholelithiasis 5. Aortic atherosclerosis (ICD10-I70.0), coronary artery atherosclerosis and emphysema (ICD10-J43.9). Electronically Signed   By: Abigail Miyamoto M.D.   On: 12/24/2021 11:35   DG Shoulder Right  Result Date: 12/12/2021 CLINICAL DATA:  Right shoulder pain.  No reported injury. EXAM: RIGHT SHOULDER - 2+ VIEW COMPARISON:  Right clavicle radiographs dated 09/12/2019. FINDINGS: Mild-to-moderate right glenohumeral spur formation and right greater tuberosity hyperostosis. No fracture or dislocation. IMPRESSION: Mild-to-moderate degenerative spur formation and hyperostosis. Electronically Signed   By: Claudie Revering M.D.   On: 12/12/2021 11:43   DG Shoulder Left  Result Date: 12/12/2021 CLINICAL DATA:  Left shoulder pain.  No reported injury. EXAM: LEFT SHOULDER - 2+ VIEW COMPARISON:  Chest radiograph dated 10/27/2021 and chest CT dated 10/22/2021. FINDINGS: Mild-to-moderate left glenohumeral and acromioclavicular degenerative spur formation. No fracture or dislocation. Thoracic spine degenerative changes. IMPRESSION: Mild to moderate left shoulder degenerative changes. Electronically Signed   By: Claudie Revering  M.D.   On: 12/12/2021 11:41     ASSESSMENT AND PLAN: This is a very pleasant 72 years old African-American female diagnosed with a stage IV (T3, N0, M1b) non-small cell lung cancer, adenocarcinoma presented with right upper lobe lung mass with solitary brain metastasis diagnosed in February 2022 status post SRS to the brain lesion followed by craniotomy and resection. The patient also has a solitary lung mass. S/p robotic assisted right upper lobectomy with en bloc wedge resection of the right middle lobe and lymph node dissection under the care of Dr. Roxan Hockey on September 24, 2020. She also underwent SRS treatment to a new subcentimeter brain lesions under the care of Dr. Lisbeth Renshaw She is currently undergoing systemic chemotherapy with carboplatin for AUC of 5, Alimta 500 Mg/M2 and Keytruda 200 Mg IV every 3 weeks status post 18 cycles.  Starting from cycle #5 the patient will be on maintenance treatment with Alimta and Keytruda every 3 weeks. The patient has been tolerating her maintenance treatment with Alimta and Keytruda fairly well with no concerning adverse effects. She had repeat CT scan of the chest, abdomen and pelvis performed recently.  I personally and independently reviewed the scans and discussed the results with the patient  and her daughter. Her scan showed no concerning findings for disease progression. I recommended for her to continue her current maintenance treatment with Alimta and Keytruda as planned and she will proceed with cycle #19 today. For the brain metastasis, she is followed by radiation oncology and neuro-oncology. The patient was advised to call immediately if she has any concerning symptoms in the interval.  The patient voices understanding of current disease status and treatment options and is in agreement with the current care plan.  All questions were answered. The patient knows to call the clinic with any problems, questions or concerns. We can certainly see the  patient much sooner if necessary.  Disclaimer: This note was dictated with voice recognition software. Similar sounding words can inadvertently be transcribed and may not be corrected upon review.

## 2021-12-26 NOTE — Progress Notes (Signed)
Hannah [name], Hannah [name] [id number] [phone]   Interval Evaluation  Date of Service: 12/26/21 Patient Name: Hannah [name] Patient MRN: [id number] Patient DOB: 19-Jan-1951 Provider: Ventura Sellers, MD  Identifying Statement:  Hannah [name] is a 71 y.o. female with brain metastasis   Primary Cancer:  Oncology History  Primary cancer of right upper lobe of lung ([name])  08/07/2020 Initial Diagnosis   Primary cancer of right upper lobe of lung ([place])   09/20/2020 Cancer Staging   Staging form: Lung, AJCC 8th Edition - Clinical: Stage IVB (cT3, cN0, cM1c) - Signed by Curt Bears, MD on 09/20/2020   11/08/2020 -  Chemotherapy   Patient is on Treatment Plan : LUNG CARBOplatin / Pemetrexed / Pembrolizumab q21d Induction x 4 cycles / Maintenance Pemetrexed + Pembrolizumab      Oncologic History 08/23/20: Pre-op SRS to L frontal mass Hannah [name]) 08/24/20: Craniotomy, resection (Hannah [name]) 11/29/20: Salvage SRS x2 Hannah [name]) 10/14/21: L frontoparietal progression, undergoes LITT at [facility]) 11/18/21: Path is adenocarcinoma, completes 25/5 SRS Hannah [name])  Interval History:  Hannah [name] presents today for follow up after having completed LITT and SRS.  Main complaint today is fatigue and increased sleep burden.  She does continue to dose remeron each night.  She continues on keppra and vimpat without frank seizures.  Now off decadron.  Continues on Pem/Pem with Dr. Julien [name].  H+P (08/02/20) Patient presented to medical attention this past week with several days rapidly progressive speech impairment and right sided weakness.  She and her family describe difficulty putting complete sentences together, impaired use of right arm (for using fork, zipper, brushing teeth), and dragging of the right leg while walking.  She never needed assistance to walk.  CNS imaging demonstrated left frontal mass, and decadron was started over the  weekend 4mg  twice per day.  She feels improved with regards to the right sided weakness with the steroids.  Otherwise no seizures, headaches.  Medications: Current Outpatient Medications on File Prior to Visit  Medication Sig Dispense Refill   acetaminophen (TYLENOL) 325 MG tablet Take 325 mg by mouth every 6 (six) hours as needed for moderate pain.     ALPRAZolam (XANAX XR) 0.5 MG 24 hr tablet Take one tablet (0.5 mg total) by mouth every night at bedtime. 30 tablet 5   Ascorbic Acid (VITAMIN C) 1000 MG tablet Take 1,000 mg by mouth daily.     aspirin 81 MG tablet Take 1 tablet (81 mg total) by mouth daily. Restart on 08/31/20 30 tablet    bisacodyl (DULCOLAX) 5 MG EC tablet Take 5 mg by mouth daily as needed for moderate constipation.     carboxymethylcellulose (REFRESH PLUS) 0.5 % SOLN Place 1-2 drops into both eyes 3 (three) times daily as needed (dry eyes).     Cholecalciferol (D3 PO) Take 1 capsule by mouth daily.     citalopram (CELEXA) 10 MG tablet Take 1 tablet (10 mg total) by mouth daily. 90 tablet 1   dexamethasone (DECADRON) 1 MG tablet Take 1 tablet (1 mg total) by mouth daily. 60 tablet 1   fluticasone (FLONASE) 50 MCG/ACT nasal spray PLACE 2 SPRAYS INTO BOTH NOSTRILS DAILY. (Patient taking differently: Place 2 sprays into both nostrils daily as needed for allergies.) 48 g 1   folic acid (FOLVITE) 1 MG tablet Take 1 tablet by mouth once daily 30 tablet 0   Lacosamide 100 MG TABS Take 1 tablet  by mouth twice daily 60 tablet 0   levETIRAcetam (KEPPRA) 100 MG/ML solution TAKE 15 ML BY MOUTH  TWICE DAILY 473 mL 0   loratadine (CLARITIN) 10 MG tablet Take 10 mg by mouth daily as needed for allergies.     magnesium hydroxide (MILK OF MAGNESIA) 800 MG/5ML suspension Take 30 mLs by mouth daily as needed for constipation.     Menthol, Topical Analgesic, (BIOFREEZE EX) Apply 1 application topically as needed (pain).     mirtazapine (REMERON) 30 MG tablet Take 1 tablet (30 mg total) by mouth  at bedtime. 30 tablet 5   olopatadine (PATANOL) 0.1 % ophthalmic solution Place 1 drop into both eyes 2 (two) times daily as needed for allergies.     omeprazole (PRILOSEC) 40 MG capsule TAKE 1 CAPSULE TWICE DAILY BEFORE MEALS (Patient taking differently: Take 40 mg by mouth daily.) 180 capsule 0   oxyCODONE (OXY IR/ROXICODONE) 5 MG immediate release tablet Take 5 mg by mouth every 8 (eight) hours as needed.     polyethylene glycol (MIRALAX / GLYCOLAX) 17 g packet Take 17 g by mouth 2 (two) times daily. 72 each 0   pravastatin (PRAVACHOL) 40 MG tablet TAKE 1 TABLET (40 MG TOTAL) BY MOUTH EVERY EVENING. (Patient taking differently: Take 40 mg by mouth daily.) 90 tablet 0   Probiotic Product (PROBIOTIC DAILY PO) Take 1 capsule by mouth daily.     prochlorperazine (COMPAZINE) 10 MG tablet TAKE 1 TABLET BY MOUTH EVERY 6 HOURS AS NEEDED FOR NAUSEA OR VOMITING 30 tablet 0   simethicone (MYLICON) 80 MG chewable tablet Chew 1 tablet (80 mg total) by mouth 4 (four) times daily as needed for flatulence (Bloating). 30 tablet 0   No current facility-administered medications on file prior to visit.    Allergies:  Allergies  Allergen Reactions   Amlodipine Swelling and Rash    Rash, swelling   Chantix [Varenicline Tartrate] Shortness Of Breath, Swelling and Other (See Comments)    Tongue swell,sob   Clarithromycin Rash   Lisinopril Hives   Simvastatin Hives   Wellbutrin [Bupropion Hcl] Hives   Lipitor [Atorvastatin]     Dizziness per patient   Sertraline     Makes her feel like she is going to kill someone   Past Medical History:  Past Medical History:  Diagnosis Date   Allergy    Anemia    Anxiety    Arthritis    Carpal tunnel syndrome    Constipation    senna C stool softeners help    Depression    Diverticulosis    Dyslipidemia    External hemorrhoids    GERD (gastroesophageal reflux disease)    Heart murmur    mild-moderate AR   Hiatal hernia    Hyperlipidemia    on meds     Hypertension    Internal hemorrhoids    lung ca with brain mets 07/2020   Osteoarthritis    Pre-diabetes    PVD (peripheral vascular disease) (HCC)    moderate carotid disease   RBBB    Smoker    Vocal cord polyps    Past Surgical History:  Past Surgical History:  Procedure Laterality Date   ABDOMINAL HYSTERECTOMY  8101   APPLICATION OF CRANIAL NAVIGATION N/A 08/24/2020   Procedure: APPLICATION OF CRANIAL NAVIGATION;  Surgeon: Judith Part, MD;  Location: [place];  Service: Neurosurgery;  Laterality: N/A;   COLONOSCOPY     COLONOSCOPY W/ POLYPECTOMY  2009   CRANIOTOMY  Left 08/24/2020   Procedure: Left Craniotomy for Tumor Resection with Brainlab;  Surgeon: Judith Part, MD;  Location: [place];  Service: Neurosurgery;  Laterality: Left;   HEMORRHOID SURGERY     INTERCOSTAL NERVE BLOCK Right 09/24/2020   Procedure: INTERCOSTAL NERVE BLOCK;  Surgeon: Melrose Nakayama, MD;  Location: [place];  Service: Thoracic;  Laterality: Right;   JOINT REPLACEMENT     LYMPH NODE DISSECTION Right 09/24/2020   Procedure: LYMPH NODE DISSECTION;  Surgeon: Melrose Nakayama, MD;  Location: [place];  Service: Thoracic;  Laterality: Right;   POLYPECTOMY     right total knee arthroplasty     Dr. Emeterio Reeve 06-04-18   [place]  08/16/2009   TOTAL KNEE ARTHROPLASTY Left 02/13/2014   Procedure: TOTAL KNEE ARTHROPLASTY;  Surgeon: Alta Corning, MD;  Location: [place];  Service: Orthopedics;  Laterality: Left;   TOTAL KNEE ARTHROPLASTY Right 06/04/2018   Procedure: RIGHT TOTAL KNEE ARTHROPLASTY;  Surgeon: Dorna Leitz, MD;  Location: [facility];  Service: Orthopedics;  Laterality: Right;  Adductor Block   TUBAL LIGATION     Social History:  Social History   Socioeconomic History   Marital status: Married    Spouse name: Not on file   Number of children: 2   Years of education: Not on file   Highest education level: Not on file  Occupational History   Not on file  Tobacco Use   Smoking status:  Former    Packs/day: 0.25    Years: 40.00    Total pack years: 10.00    Types: Cigarettes    Quit date: 07/09/2020    Years since quitting: 1.4    Passive exposure: Never   Smokeless tobacco: Never   Tobacco comments:    PACK WILL LAST 3 days  Vaping Use   Vaping Use: Never used  Substance and Sexual Activity   Alcohol use: No   Drug use: No   Sexual activity: Not Currently  Other Topics Concern   Not on file  Social History Narrative   Not on file   Social Determinants of Health   Financial Resource Strain: Low Risk  (10/10/2020)   Overall Financial Resource Strain (CARDIA)    Difficulty of Paying Living Expenses: Not hard at all  Food Insecurity: No Food Insecurity (10/02/2021)   Hunger Vital Sign    Worried About Running Out of Food in the Last Year: Never true    [place] in the Last Year: Never true  Transportation Needs: No Transportation Needs (10/02/2021)   PRAPARE - Hydrologist (Medical): No    Lack of Transportation (Non-Medical): No  Physical Activity: Sufficiently Active (04/12/2021)   Exercise Vital Sign    Days of Exercise per Week: 3 days    Minutes of Exercise per Session: 60 min  Stress: No Stress Concern Present (04/12/2021)   [place]    Feeling of Stress : Not at all  Social Connections: [place] (04/12/2021)   Social Connection and Isolation Panel [NHANES]    Frequency of Communication with Friends and Family: More than three times a week    Frequency of Social Gatherings with Friends and Family: More than three times a week    Attends Religious Services: More than 4 times per year    Active Member of [employer] or Organizations: Yes    Attends Archivist Meetings: More than 4 times  per year    Marital Status: Married  Human resources officer Violence: Not At Risk (04/12/2021)   Humiliation, Afraid, Rape, and Kick questionnaire    Fear of  Current or Ex-Partner: No    Emotionally Abused: No    Physically Abused: No    Sexually Abused: No   Family History:  Family History  Problem Relation Age of Onset   Cancer Father    Prostate cancer Father    Diabetes Sister    Cancer Sister    Stroke Maternal Grandfather    Colitis Maternal Aunt    Breast cancer Maternal Aunt    Colon cancer Neg Hx    Colon polyps Neg Hx    Rectal cancer Neg Hx    Stomach cancer Neg Hx    Esophageal cancer Neg Hx     Review of Systems: Constitutional: Doesn't report fevers, chills or abnormal weight loss Eyes: Doesn't report blurriness of vision Ears, nose, mouth, throat, and face: Doesn't report sore throat Respiratory: Doesn't report cough, dyspnea or wheezes Cardiovascular: Doesn't report palpitation, chest discomfort  Gastrointestinal:  Doesn't report nausea, constipation, diarrhea GU: Doesn't report incontinence Skin: Doesn't report skin rashes Neurological: Per HPI Musculoskeletal: Doesn't report joint pain Behavioral/Psych: ++anxiety  Physical Exam: Wt Readings from Last 3 Encounters:  12/26/21 137 lb 3.2 oz (62.2 kg)  12/25/21 135 lb 9.6 oz (61.5 kg)  12/11/21 135 lb (61.2 kg)   Temp Readings from Last 3 Encounters:  12/26/21 98.2 F (36.8 C) (Oral)  12/11/21 97.7 F (36.5 C) (Oral)  12/05/21 (!) 96.8 F (36 C) (Tympanic)   BP Readings from Last 3 Encounters:  12/26/21 140/77  12/25/21 118/70  12/11/21 120/78   Pulse Readings from Last 3 Encounters:  12/26/21 75  12/25/21 79  12/11/21 (!) 105     KPS: 80. General: Alert, cooperative, pleasant, in no acute distress Head: Normal EENT: No conjunctival injection or scleral icterus.  Lungs: Resp effort normal Cardiac: Regular rate Abdomen: Non-distended abdomen Skin: No rashes cyanosis or petechiae. Extremities: No clubbing or edema  Neurologic Exam: Mental Status: Awake, alert, attentive to examiner. Oriented to self and environment. Language is intact  with regards to fluency, comprehension.  Psychomotor slowing, impaired recall. Cranial Nerves: Visual acuity is grossly normal. Visual fields are full. Extra-ocular movements intact. No ptosis. Face is symmetric Motor: Tone and bulk are normal. Power is 5/5 throughout. Reflexes are symmetric, no pathologic reflexes present.  Sensory: Intact to light touch Gait: Independent  Labs: I have reviewed the data as listed    Component Value Date/Time   NA 141 12/26/2021 0929   K 4.0 12/26/2021 0929   CL 106 12/26/2021 0929   CO2 28 12/26/2021 0929   GLUCOSE 124 (H) 12/26/2021 0929   BUN 12 12/26/2021 0929   CREATININE 0.69 12/26/2021 0929   CREATININE 0.79 03/14/2020 1016   CALCIUM 9.8 12/26/2021 0929   PROT 6.9 12/26/2021 0929   ALBUMIN 4.0 12/26/2021 0929   AST 21 12/26/2021 0929   ALT 17 12/26/2021 0929   ALKPHOS 85 12/26/2021 0929   BILITOT 0.2 (L) 12/26/2021 0929   GFRNONAA >60 12/26/2021 0929   GFRNONAA 75 03/14/2020 1016   GFRAA 87 03/14/2020 1016   Lab Results  Component Value Date   WBC 7.4 12/26/2021   NEUTROABS 6.0 12/26/2021   HGB 11.0 (L) 12/26/2021   HCT 35.0 (L) 12/26/2021   MCV 90.0 12/26/2021   PLT 296 12/26/2021    Imaging:  [facility] [place] Clinician Interpretation: I have  personally reviewed the CNS images as listed.  My interpretation, in the context of the patient's clinical presentation, is treatment effect vs true progression  CT Chest W Contrast  Result Date: 12/24/2021 CLINICAL DATA:  Primary Cancer Type: Lung Imaging Indication: Assess response to therapy Interval therapy since last imaging? Yes Initial Cancer Diagnosis Date: 09/24/2020; Established by: Biopsy-proven Detailed Pathology: Stage IV non-small cell lung cancer, adenocarcinoma. Primary Tumor location:   Right upper lobe. Brain metastasis. Surgeries: Right upper lobectomy 09/24/2020. Left frontal craniotomy 08/24/2020. Hysterectomy. Chemotherapy: Yes; Ongoing? Yes; Most recent administration: 12/05/2021  Immunotherapy?  Yes; Type: Keytruda; Ongoing? Yes Radiation therapy? Yes Date Range: 11/07/2021 - 11/18/2021; Target: Brain Date Range: 08/23/2020 and 11/29/2020; Target: Brain * Tracking Code: BO * EXAM: CT CHEST, ABDOMEN, AND PELVIS WITH CONTRAST TECHNIQUE: Multidetector CT imaging of the chest, abdomen and pelvis was performed following the standard protocol during bolus administration of intravenous contrast. RADIATION DOSE REDUCTION: This exam was performed according to the departmental dose-optimization program which includes automated exposure control, adjustment of the mA and/or kV according to patient size and/or use of iterative reconstruction technique. CONTRAST:  14mL ISOVUE-300 IOPAMIDOL (ISOVUE-300) INJECTION 61% COMPARISON:  Most recent CT chest, abdomen and pelvis 10/22/2021. 08/08/2020 PET-CT. FINDINGS: CT CHEST FINDINGS Cardiovascular: Aortic atherosclerosis. Tortuous descending thoracic aorta. Normal heart size, without pericardial effusion. Lad and left circumflex coronary artery calcification. No central pulmonary embolism, on this non-dedicated study. Mediastinum/Nodes: No supraclavicular adenopathy. No mediastinal or hilar adenopathy. Lungs/Pleura: Trace right pleural fluid, increased. Right upper lobectomy. Mild centrilobular and paraseptal emphysema. Mucoid impaction in the anterior left lower lobe. 2-3 mm left upper lobe pulmonary nodule on 77/3 is unchanged. Left lower lobe 2-3 mm nodule on 69/3 is also unchanged. Musculoskeletal: No acute osseous abnormality. CT ABDOMEN PELVIS FINDINGS Hepatobiliary: Normal liver. 8 mm gallstone without acute cholecystitis or biliary duct dilatation. Pancreas: Mild pancreatic atrophy is within normal variation for age. Spleen: Normal in size, without focal abnormality. Adrenals/Urinary Tract: Normal right adrenal gland. Mild left adrenal thickening is unchanged. Mild renal cortical scarring bilaterally. No hydronephrosis. Normal urinary bladder.  Stomach/Bowel: Normal stomach, without wall thickening. Normal colon, appendix, and terminal ileum. Normal small bowel. Vascular/Lymphatic: Aortic and branch vessel atherosclerosis. No abdominopelvic adenopathy. Reproductive: Hysterectomy.  No adnexal mass. Other: Moderate pelvic floor laxity. No significant free fluid. No free intraperitoneal air. No evidence of omental or peritoneal disease. Musculoskeletal: Lipoma within the right iliacus tendon of 2.0 cm on 106/2. Mild right hip osteoarthritis. Probable bone island within the right ischium at 6 mm on 102/2, similar. Posterior element fixation at L3-5. IMPRESSION: 1. Status post right upper lobectomy. No findings of recurrent or metastatic disease in the chest, abdomen, or pelvis. 2. Slight increase in trace right pleural fluid. 3. Similar left-sided pulmonary nodules, favoring a benign etiology. 4. Cholelithiasis 5. Aortic atherosclerosis (ICD10-I70.0), coronary artery atherosclerosis and emphysema (ICD10-J43.9). Electronically Signed   By: Abigail Miyamoto M.D.   On: 12/24/2021 11:35   CT Abdomen Pelvis W Contrast  Result Date: 12/24/2021 CLINICAL DATA:  Primary Cancer Type: Lung Imaging Indication: Assess response to therapy Interval therapy since last imaging? Yes Initial Cancer Diagnosis Date: 09/24/2020; Established by: Biopsy-proven Detailed Pathology: Stage IV non-small cell lung cancer, adenocarcinoma. Primary Tumor location:   Right upper lobe. Brain metastasis. Surgeries: Right upper lobectomy 09/24/2020. Left frontal craniotomy 08/24/2020. Hysterectomy. Chemotherapy: Yes; Ongoing? Yes; Most recent administration: 12/05/2021 Immunotherapy?  Yes; Type: Keytruda; Ongoing? Yes Radiation therapy? Yes Date Range: 11/07/2021 - 11/18/2021; Target: Brain Date Range: 08/23/2020  and 11/29/2020; Target: Brain * Tracking Code: BO * EXAM: CT CHEST, ABDOMEN, AND PELVIS WITH CONTRAST TECHNIQUE: Multidetector CT imaging of the chest, abdomen and pelvis was performed  following the standard protocol during bolus administration of intravenous contrast. RADIATION DOSE REDUCTION: This exam was performed according to the departmental dose-optimization program which includes automated exposure control, adjustment of the mA and/or kV according to patient size and/or use of iterative reconstruction technique. CONTRAST:  120mL ISOVUE-300 IOPAMIDOL (ISOVUE-300) INJECTION 61% COMPARISON:  Most recent CT chest, abdomen and pelvis 10/22/2021. 08/08/2020 PET-CT. FINDINGS: CT CHEST FINDINGS Cardiovascular: Aortic atherosclerosis. Tortuous descending thoracic aorta. Normal heart size, without pericardial effusion. Lad and left circumflex coronary artery calcification. No central pulmonary embolism, on this non-dedicated study. Mediastinum/Nodes: No supraclavicular adenopathy. No mediastinal or hilar adenopathy. Lungs/Pleura: Trace right pleural fluid, increased. Right upper lobectomy. Mild centrilobular and paraseptal emphysema. Mucoid impaction in the anterior left lower lobe. 2-3 mm left upper lobe pulmonary nodule on 77/3 is unchanged. Left lower lobe 2-3 mm nodule on 69/3 is also unchanged. Musculoskeletal: No acute osseous abnormality. CT ABDOMEN PELVIS FINDINGS Hepatobiliary: Normal liver. 8 mm gallstone without acute cholecystitis or biliary duct dilatation. Pancreas: Mild pancreatic atrophy is within normal variation for age. Spleen: Normal in size, without focal abnormality. Adrenals/Urinary Tract: Normal right adrenal gland. Mild left adrenal thickening is unchanged. Mild renal cortical scarring bilaterally. No hydronephrosis. Normal urinary bladder. Stomach/Bowel: Normal stomach, without wall thickening. Normal colon, appendix, and terminal ileum. Normal small bowel. Vascular/Lymphatic: Aortic and branch vessel atherosclerosis. No abdominopelvic adenopathy. Reproductive: Hysterectomy.  No adnexal mass. Other: Moderate pelvic floor laxity. No significant free fluid. No free  intraperitoneal air. No evidence of omental or peritoneal disease. Musculoskeletal: Lipoma within the right iliacus tendon of 2.0 cm on 106/2. Mild right hip osteoarthritis. Probable bone island within the right ischium at 6 mm on 102/2, similar. Posterior element fixation at L3-5. IMPRESSION: 1. Status post right upper lobectomy. No findings of recurrent or metastatic disease in the chest, abdomen, or pelvis. 2. Slight increase in trace right pleural fluid. 3. Similar left-sided pulmonary nodules, favoring a benign etiology. 4. Cholelithiasis 5. Aortic atherosclerosis (ICD10-I70.0), coronary artery atherosclerosis and emphysema (ICD10-J43.9). Electronically Signed   By: Abigail Miyamoto M.D.   On: 12/24/2021 11:35   DG Shoulder Right  Result Date: 12/12/2021 CLINICAL DATA:  Right shoulder pain.  No reported injury. EXAM: RIGHT SHOULDER - 2+ VIEW COMPARISON:  Right clavicle radiographs dated 09/12/2019. FINDINGS: Mild-to-moderate right glenohumeral spur formation and right greater tuberosity hyperostosis. No fracture or dislocation. IMPRESSION: Mild-to-moderate degenerative spur formation and hyperostosis. Electronically Signed   By: Claudie Revering M.D.   On: 12/12/2021 11:43   DG Shoulder Left  Result Date: 12/12/2021 CLINICAL DATA:  Left shoulder pain.  No reported injury. EXAM: LEFT SHOULDER - 2+ VIEW COMPARISON:  Chest radiograph dated 10/27/2021 and chest CT dated 10/22/2021. FINDINGS: Mild-to-moderate left glenohumeral and acromioclavicular degenerative spur formation. No fracture or dislocation. Thoracic spine degenerative changes. IMPRESSION: Mild to moderate left shoulder degenerative changes. Electronically Signed   By: Claudie Revering M.D.   On: 12/12/2021 11:41      Assessment/Plan Primary malignant neoplasm of lung with metastasis to brain [facility])  Caylei N Wessler is clinically stable today, now having completed LITT and post-op SRS.  Brain [facility] done [facility] demonstrated modest inflammatory changes  only.  She remains off decadron and without meaningful focality.  She has scan and visit scheduled for next month with [facility].  We are happy to  take over imaging and surveillance following that, given patient preference.  Recommended discontinuing remeron.  Recommended continuing Keppra 1000mg  BID and Vimpat 100mg  BID.  Will con't systemic therapy with Dr. Julien [name].  We appreciate the opportunity to participate in the care of Hannah [name].    We ask that Hannah [name] return to clinic in 4 months following next brain MRI, or sooner as needed.  All questions were answered. The patient knows to call the clinic with any problems, questions or concerns. No barriers to learning were detected.  The total time spent in the encounter was 40 minutes and more than 50% was on counseling and review of test results   Ventura Sellers, MD Medical Director of Neuro-Oncology [facility] [place] 12/26/21 10:28 AM

## 2021-12-30 ENCOUNTER — Other Ambulatory Visit: Payer: Self-pay | Admitting: Internal Medicine

## 2022-01-01 ENCOUNTER — Ambulatory Visit (INDEPENDENT_AMBULATORY_CARE_PROVIDER_SITE_OTHER): Payer: Medicare HMO | Admitting: *Deleted

## 2022-01-01 ENCOUNTER — Encounter: Payer: Self-pay | Admitting: Internal Medicine

## 2022-01-01 ENCOUNTER — Other Ambulatory Visit: Payer: Self-pay

## 2022-01-01 DIAGNOSIS — C3491 Malignant neoplasm of unspecified part of right bronchus or lung: Secondary | ICD-10-CM

## 2022-01-01 DIAGNOSIS — I10 Essential (primary) hypertension: Secondary | ICD-10-CM

## 2022-01-01 NOTE — Chronic Care Management (AMB) (Signed)
Chronic Care Management   CCM RN Visit Note  01/01/2022 Name: Hannah [name] MRN: [id number] DOB: 31-May-1951  Subjective: Hannah [name] is a 71 y.o. year old female who is a primary care patient of Burns, Claudina Lick, MD. The care management team was consulted for assistance with disease management and care coordination needs.    Engaged with daughter/ caregiver Hannah [name], on [place] and with patient by telephone for follow up visit/ CCM RN CM case closure in response to provider referral for case management and/or care coordination services.   Consent to Services:  The patient was given information about Chronic Care Management services, agreed to services, and gave verbal consent prior to initiation of services.  Please see initial visit note for detailed documentation.  Patient agreed to services and verbal consent obtained.   Assessment: Review of patient past medical history, allergies, medications, health status, including review of consultants reports, laboratory and other test data, was performed as part of comprehensive evaluation and provision of chronic care management services.   SDOH (Social Determinants of Health) assessments and interventions performed:  SDOH Interventions    Flowsheet Row Most Recent Value  SDOH Interventions   Food Insecurity Interventions Intervention Not Indicated  [continues to deny food insecurity]  Transportation Interventions Intervention Not Indicated  [family members continue to provide transportation]     CCM Care Plan  Allergies  Allergen Reactions   Amlodipine Swelling and Rash    Rash, swelling   Chantix [Varenicline Tartrate] Shortness Of Breath, Swelling and Other (See Comments)    Tongue swell,sob   Clarithromycin Rash   Lisinopril Hives   Simvastatin Hives   Wellbutrin [Bupropion Hcl] Hives   Lipitor [Atorvastatin]     Dizziness per patient   Sertraline     Makes her feel like she is going to kill someone   Outpatient Encounter  Medications as of 01/01/2022  Medication Sig Note   acetaminophen (TYLENOL) 325 MG tablet Take 325 mg by mouth every 6 (six) hours as needed for moderate pain.    ALPRAZolam (XANAX XR) 0.5 MG 24 hr tablet Take one tablet (0.5 mg total) by mouth every night at bedtime. 10/24/2021: Takes in am   Ascorbic Acid (VITAMIN C) 1000 MG tablet Take 1,000 mg by mouth daily.    aspirin 81 MG tablet Take 1 tablet (81 mg total) by mouth daily. Restart on 08/31/20    bisacodyl (DULCOLAX) 5 MG EC tablet Take 5 mg by mouth daily as needed for moderate constipation.    carboxymethylcellulose (REFRESH PLUS) 0.5 % SOLN Place 1-2 drops into both eyes 3 (three) times daily as needed (dry eyes).    Cholecalciferol (D3 PO) Take 1 capsule by mouth daily.    citalopram (CELEXA) 10 MG tablet Take 1 tablet (10 mg total) by mouth daily. 12/26/2021: Takes 5 mg/day   dexamethasone (DECADRON) 1 MG tablet Take 1 tablet (1 mg total) by mouth daily. 10/24/2021: Not taking per Dr. Mickeal Skinner.   fluticasone (FLONASE) 50 MCG/ACT nasal spray PLACE 2 SPRAYS INTO BOTH NOSTRILS DAILY. (Patient taking differently: Place 2 sprays into both nostrils daily as needed for allergies.)    folic acid (FOLVITE) 1 MG tablet Take 1 tablet (1 mg total) by mouth daily.    Lacosamide 100 MG TABS Take 1 tablet (100 mg total) by mouth 2 (two) times daily.    levETIRAcetam (KEPPRA) 100 MG/ML solution TAKE 15 ML BY MOUTH  TWICE DAILY    loratadine (CLARITIN) 10 MG tablet Take  10 mg by mouth daily as needed for allergies.    magnesium hydroxide (MILK OF MAGNESIA) 800 MG/5ML suspension Take 30 mLs by mouth daily as needed for constipation.    Menthol, Topical Analgesic, (BIOFREEZE EX) Apply 1 application topically as needed (pain).    olopatadine (PATANOL) 0.1 % ophthalmic solution Place 1 drop into both eyes 2 (two) times daily as needed for allergies.    omeprazole (PRILOSEC) 40 MG capsule TAKE 1 CAPSULE TWICE DAILY BEFORE MEALS (Patient taking differently: Take 40 mg  by mouth daily.)    oxyCODONE (OXY IR/ROXICODONE) 5 MG immediate release tablet Take 5 mg by mouth every 8 (eight) hours as needed.    polyethylene glycol (MIRALAX / GLYCOLAX) 17 g packet Take 17 g by mouth 2 (two) times daily.    pravastatin (PRAVACHOL) 40 MG tablet TAKE 1 TABLET (40 MG TOTAL) BY MOUTH EVERY EVENING. (Patient taking differently: Take 40 mg by mouth daily.)    Probiotic Product (PROBIOTIC DAILY PO) Take 1 capsule by mouth daily.    prochlorperazine (COMPAZINE) 10 MG tablet TAKE 1 TABLET BY MOUTH EVERY 6 HOURS AS NEEDED FOR NAUSEA OR VOMITING    simethicone (MYLICON) 80 MG chewable tablet Chew 1 tablet (80 mg total) by mouth 4 (four) times daily as needed for flatulence (Bloating).    No facility-administered encounter medications on file as of 01/01/2022.   Patient Active Problem List   Diagnosis Date Noted   Bilateral shoulder pain 12/11/2021   Fatigue 12/11/2021   Metastatic lung cancer (metastasis from lung to other site) [facility]) 09/18/2021   Chest pain 08/30/2021   Urinary frequency 08/30/2021   Vasogenic brain edema ([place]) 04/22/2021   Seizure ([place]) 04/22/2021   Nonrheumatic aortic valve insufficiency 04/18/2021   Sinusitis 02/21/2021   Allergic rhinitis 02/21/2021   Encounter for antineoplastic chemotherapy 10/23/2020   Encounter for antineoplastic immunotherapy 10/23/2020   Anemia 10/19/2020   Sleep difficulties 10/19/2020   S/P robot-assisted surgical procedure 09/24/2020   Aortic atherosclerosis ([place]) 09/12/2020   Status post craniotomy 08/24/2020   Hematochezia 08/19/2020   Rectal bleeding 08/17/2020   Primary cancer of right upper lobe of lung ([place]) 08/07/2020   Primary malignant neoplasm of lung with metastasis to brain ([place]) 08/06/2020   Memory changes 07/24/2020   Non-recurrent unilateral inguinal hernia without obstruction or gangrene 07/24/2020   Pain of right clavicle 09/12/2019   Nonspecific abnormal electrocardiogram (ECG) (EKG) 10/20/2018    Depression 07/17/2018   Primary osteoarthritis of right knee 06/04/2018   Bilateral carotid artery stenosis 09/24/2017   Aortic valve sclerosis 09/24/2017   Vocal cord polyps 09/07/2017   Dysphagia 09/07/2017   Diabetes mellitus without complication ([place]) 99/35/7017   GERD (gastroesophageal reflux disease) 02/27/2016   Anxiety 02/27/2016   S/P total knee replacement 02/13/2014   RBBB 02/08/2014   PVD - bilateral 60-79% carotid strenosis 02/08/2014   Mixed hyperlipidemia 11/29/2013   Carpal tunnel syndrome, bilateral 11/23/2013   Murmur- mild -mod AR, mild MR 11/10/2013   Constipation 12/16/2012   Conditions to be addressed/monitored:  HTN and CA  Care Plan : RN Care Manager Plan of Care  Updates made by Hannah Royalty, RN since 01/01/2022 12:00 AM     Problem: Chronic Disease Management Needs   Priority: Medium     Long-Range Goal: Ongoing adherence to established plan of care for long-term chronic disease management   Start Date: 12/27/2020  Expected End Date: 12/27/2020  Priority: Medium  Note:   Current Barriers:  Chronic Disease  Management support and education needs related to HTN and Cancer Surgery x 2 for cancer treatment- ongoing recuperation/ pain; recent metastases to brain  Hospitalization October 24-30, 2022 for cerebral edema: discharged home to self-care ED visit 07/29/21 for chest discomfort- discharged home 08/01/21: At least one new/ recent fall, without injury: caregiver reports patient does not require use of assistive devices Caregiver denies new/ recent falls since last outreach 08/01/21 01/01/22: caregiver and patient continue to deny new/ recent falls since last fall in February 2023  RNCM Clinical Goal(s):  Patient will demonstrate ongoing health management independence for HTN/ cancer  through collaboration with RN Care manager, provider, and care team.   Interventions: 1:1 collaboration with primary care provider regarding development and update of  comprehensive plan of care as evidenced by provider attestation and co-signature Inter-disciplinary care team collaboration (see longitudinal plan of care) Evaluation of current treatment plan related to  self management and patient's adherence to plan as established by provider Review of patient status, including review of consultants reports, relevant laboratory and other test results, and medications completed SDOH updated: no new/ unmet concerns identified Pain assessment updated: recently evaluated by sports medicine provider for shoulder pain: reports this is "much better" since shoulder injections were given; denies pain today Falls assessment updated: continues to deny new/ recent falls since last fall in February 2023; not currently using assistive devices;  positive reinforcement provided with encouragement to continue efforts at fall prevention; previously provided education around fall risks/ prevention reinforced Medications discussed: caregiver reports that she continues to supervise/ manage patient medications and denies current concerns/ issues/ questions around medications; endorses adherence to taking all medications as prescribed Confirms she has stopped giving patient Remeron, on advice of oncology specialist; states "she is much more alert since we stopped" Reviewed upcoming scheduled provider appointments: 01/16/22, 02/06/22, 02/27/22- ongoing oncology provider/ treatment visits; caregiver/ patient confirms is aware of all and has plans to attend as scheduled Discussed plans with caregiver/ patient for ongoing care management follow up- they both deny current care coordination/ care management needs and is agreeable to CCM RN CM case closure today; verbalizes understanding to contact PCP or other care providers for any needs that arise in the future, and confirms they have contact information for all care providers     Hypertension and lung cancer with mets to brain: (Status: 01/01/22:  Goal Met.) Long-Term goal Last practice recorded BP readings:  BP Readings from Last 3 Encounters:  12/26/21 140/77  12/25/21 118/70  12/11/21 120/78  Most recent eGFR/CrCl: No results found for: EGFR  No components found for: CRCL  Evaluation of current treatment plan related to hypertension self management and patient's adherence to plan as established by provider;   Reviewed prescribed diet low salt, heart healthy Confirmed with caregiver/ daughter that patient has continued occasionally monitoring/ recording blood pressures at home: per report, "they have been just fine, no concerns;" caregiver is at work and does not have recorded blood values for our review today- we reviewed recent provider office visit notes/ blood pressures- all are WNL Confirmed patient appetite remains "very good;" caregiver reports patient  Reviewed with patient and caregiver recent office visit to oncology providers and PCP: they deny post-visit questions and verbalize a good understanding of post-visit instructions/ plan of care Confirmed no current or recent signs/ symptoms UTI: reinforced previously provided education around same, along with corresponding action plan    Plan: No further follow up required: caregiver/ patient denies current care coordination/ care management needs  and is agreeable to CCM RN CM case closure today; CCM RN CM case closure accordingly     [name], RN, BSN, [facility] [phone]: direct office

## 2022-01-02 ENCOUNTER — Other Ambulatory Visit: Payer: Self-pay | Admitting: Radiation Therapy

## 2022-01-13 NOTE — Progress Notes (Signed)
[facility] OFFICE PROGRESS NOTE  [name], MD [place] [address]  DIAGNOSIS: Stage IV non-small cell lung cancer (T3, N0, M1C) adenocarcinoma.  The patient presented with a right upper lobe lung mass and solitary brain metastasis.  The patient was diagnosed in February 2022.   Biomarker Findings Microsatellite status - MS-Stable Tumor Mutational Burden - 8 Muts/Mb Genomic Findings For a complete list of the genes assayed, please refer to the Appendix. KRAS G12V KEAP1 S224F TP53 P151T 7 Disease relevant genes with no reportable alterations: ALK, BRAF, EGFR, ERBB2, MET, RET, ROS1   PDL1 Expression: 90%  PRIOR THERAPY:  1) SRS to the metastatic brain lesion on 08/23/2020 under the care of Dr. Tammi Klippel and craniotomy under the care of Dr. Zada Finders on 08/24/2020. 2) S/p robotic assisted right upper lobectomy with en bloc wedge resection of the right middle lobe and lymph node dissection under the care of Dr. Roxan Hockey on September 24, 2020 3) SRS to the two new subcentimeter metastases under the care of Dr. Tammi Klippel on 11/29/20.  4) 10/14/21: L frontoparietal progression, undergoes L[facility] at [facility]) 5) SRS to brain metastasis, last dose on 11/18/21    CURRENT THERAPY:  Systemic chemotherapy with carboplatin for AUC of 5, Alimta 500 Mg/M2 and Keytruda 200 mg IV every 3 weeks.  First dose Nov 08, 2020. Status post 19 cycles.  Starting from cycle #5, the patient started maintenance Alimta and Keytruda.    INTERVAL HISTORY: Hannah [name] 71 y.o. female returns to the clinic today for a follow-up visit. The patient is feeling well today without any concerning complaints.  She had been reporting fatigue and some of her doses of her medications were cut back.  Therefore the patient has been staying awake more of the day compared to prior.  She lost approximately 6 pounds since her last appointment.  She states that she is just not eating as much because it  is hot outside.  She does drink protein drinks.  She typically only eats 1 big meal a day though.  The patient discontinued Remeron due to fatigue.   Otherwise, she is tolerating her treatment with Keytruda and Alimta well without any concerning adverse side effects. Denies any fever or chills.  She sometimes has night sweats but reports it is environmental to too many covers.  Denies any shortness of breath. Denies cough. Denies any chest discomfort or hemoptysis.  Denies any nausea, vomiting, diarrhea, or constipation. She denies any headache or visual changes.she sees her neurosurgeon from [facility] an upcoming appointment soon.  She follows closely with neurooncology and radiation oncology regarding her history of metastatic disease to the brain.  She is here today for evaluation and repeat blood work before starting cycle #20.    MEDICAL HISTORY: Past Medical History:  Diagnosis Date   Allergy    Anemia    Anxiety    Arthritis    Carpal tunnel syndrome    Constipation    senna C stool softeners help    Depression    Diverticulosis    Dyslipidemia    External hemorrhoids    GERD (gastroesophageal reflux disease)    Heart murmur    mild-moderate AR   Hiatal hernia    Hyperlipidemia    on meds    Hypertension    Internal hemorrhoids    lung ca with brain mets 07/2020   Osteoarthritis    Pre-diabetes    PVD (peripheral vascular disease) ([place])  moderate carotid disease   RBBB    Smoker    Vocal cord polyps     ALLERGIES:  is allergic to amlodipine, chantix [varenicline tartrate], clarithromycin, lisinopril, simvastatin, wellbutrin [bupropion hcl], lipitor [atorvastatin], and sertraline.  MEDICATIONS:  Current Outpatient Medications  Medication Sig Dispense Refill   acetaminophen (TYLENOL) 325 MG tablet Take 325 mg by mouth every 6 (six) hours as needed for moderate pain.     ALPRAZolam (XANAX XR) 0.5 MG 24 hr tablet Take one tablet (0.5 mg total) by mouth every night at  bedtime. 30 tablet 5   Ascorbic Acid (VITAMIN C) 1000 MG tablet Take 1,000 mg by mouth daily.     aspirin 81 MG tablet Take 1 tablet (81 mg total) by mouth daily. Restart on 08/31/20 30 tablet    bisacodyl (DULCOLAX) 5 MG EC tablet Take 5 mg by mouth daily as needed for moderate constipation.     carboxymethylcellulose (REFRESH PLUS) 0.5 % SOLN Place 1-2 drops into both eyes 3 (three) times daily as needed (dry eyes).     Cholecalciferol (D3 PO) Take 1 capsule by mouth daily.     citalopram (CELEXA) 10 MG tablet Take 1 tablet (10 mg total) by mouth daily. 90 tablet 1   dexamethasone (DECADRON) 1 MG tablet Take 1 tablet (1 mg total) by mouth daily. 60 tablet 1   fluticasone (FLONASE) 50 MCG/ACT nasal spray PLACE 2 SPRAYS INTO BOTH NOSTRILS DAILY. (Patient taking differently: Place 2 sprays into both nostrils daily as needed for allergies.) 48 g 1   folic acid (FOLVITE) 1 MG tablet Take 1 tablet (1 mg total) by mouth daily. 30 tablet 3   Lacosamide 100 MG TABS Take 1 tablet (100 mg total) by mouth 2 (two) times daily. 60 tablet 3   levETIRAcetam (KEPPRA) 100 MG/ML solution TAKE 15 ML BY MOUTH  TWICE DAILY 473 mL 0   loratadine (CLARITIN) 10 MG tablet Take 10 mg by mouth daily as needed for allergies.     magnesium hydroxide (MILK OF MAGNESIA) 800 MG/5ML suspension Take 30 mLs by mouth daily as needed for constipation.     Menthol, Topical Analgesic, (BIOFREEZE EX) Apply 1 application topically as needed (pain).     olopatadine (PATANOL) 0.1 % ophthalmic solution Place 1 drop into both eyes 2 (two) times daily as needed for allergies.     omeprazole (PRILOSEC) 40 MG capsule TAKE 1 CAPSULE TWICE DAILY BEFORE MEALS (Patient taking differently: Take 40 mg by mouth daily.) 180 capsule 0   oxyCODONE (OXY IR/ROXICODONE) 5 MG immediate release tablet Take 5 mg by mouth every 8 (eight) hours as needed.     polyethylene glycol (MIRALAX / GLYCOLAX) 17 g packet Take 17 g by mouth 2 (two) times daily. 72 each 0    pravastatin (PRAVACHOL) 40 MG tablet TAKE 1 TABLET (40 MG TOTAL) BY MOUTH EVERY EVENING. (Patient taking differently: Take 40 mg by mouth daily.) 90 tablet 0   Probiotic Product (PROBIOTIC DAILY PO) Take 1 capsule by mouth daily.     prochlorperazine (COMPAZINE) 10 MG tablet TAKE 1 TABLET BY MOUTH EVERY 6 HOURS AS NEEDED FOR NAUSEA OR VOMITING 30 tablet 0   simethicone (MYLICON) 80 MG chewable tablet Chew 1 tablet (80 mg total) by mouth 4 (four) times daily as needed for flatulence (Bloating). 30 tablet 0   No current facility-administered medications for this visit.    SURGICAL HISTORY:  Past Surgical History:  Procedure Laterality Date   ABDOMINAL HYSTERECTOMY  8299   APPLICATION OF CRANIAL NAVIGATION N/A 08/24/2020   Procedure: APPLICATION OF CRANIAL NAVIGATION;  Surgeon: Judith Part, MD;  Location: [facility];  Service: Neurosurgery;  Laterality: N/A;   COLONOSCOPY     COLONOSCOPY W/ POLYPECTOMY  2009   CRANIOTOMY Left 08/24/2020   Procedure: Left Craniotomy for Tumor Resection with Brainlab;  Surgeon: Judith Part, MD;  Location: [facility];  Service: Neurosurgery;  Laterality: Left;   HEMORRHOID SURGERY     INTERCOSTAL NERVE BLOCK Right 09/24/2020   Procedure: INTERCOSTAL NERVE BLOCK;  Surgeon: Melrose Nakayama, MD;  Location: [facility];  Service: Thoracic;  Laterality: Right;   JOINT REPLACEMENT     LYMPH NODE DISSECTION Right 09/24/2020   Procedure: LYMPH NODE DISSECTION;  Surgeon: Melrose Nakayama, MD;  Location: [facility];  Service: Thoracic;  Laterality: Right;   POLYPECTOMY     right total knee arthroplasty     Dr. Emeterio Reeve 06-04-18   [place]  08/16/2009   TOTAL KNEE ARTHROPLASTY Left 02/13/2014   Procedure: TOTAL KNEE ARTHROPLASTY;  Surgeon: Alta Corning, MD;  Location: [facility];  Service: Orthopedics;  Laterality: Left;   TOTAL KNEE ARTHROPLASTY Right 06/04/2018   Procedure: RIGHT TOTAL KNEE ARTHROPLASTY;  Surgeon: Dorna Leitz, MD;  Location: [facility];  Service:  Orthopedics;  Laterality: Right;  Adductor Block   TUBAL LIGATION      REVIEW OF SYSTEMS:   Review of Systems  Constitutional: Positive for improving fatigue.  Positive for weight loss.  Negative for appetite change, chills, and fever HENT:   Negative for mouth sores, nosebleeds, sore throat and trouble swallowing.   Eyes: Negative for eye problems and icterus.  Respiratory: Negative for cough, hemoptysis, shortness of breath and wheezing.   Cardiovascular: Negative for chest pain and leg swelling.  Gastrointestinal: Negative for abdominal pain, constipation, diarrhea, nausea and vomiting.  Genitourinary: Negative for bladder incontinence, difficulty urinating, dysuria, frequency and hematuria.   Musculoskeletal: Negative for back pain, gait problem, neck pain and neck stiffness.  Skin: Negative for itching and rash.  Neurological: Negative for dizziness, extremity weakness, gait problem, headaches, light-headedness and seizures.  Hematological: Negative for adenopathy. Does not bruise/bleed easily.  Psychiatric/Behavioral: Negative for confusion, depression and sleep disturbance. The patient is not nervous/anxious.     PHYSICAL EXAMINATION:  There were no vitals taken for this visit.  ECOG PERFORMANCE STATUS: 1  Physical Exam  Constitutional: Oriented to person, place, and time and thin appearing female and in no distress. HENT: Head: Normocephalic and atraumatic. Mouth/Throat: Oropharynx is clear and moist. No oropharyngeal exudate. No thrush.  Eyes: Conjunctivae are normal. Right eye exhibits no discharge. Left eye exhibits no discharge. No scleral icterus. Neck: Normal range of motion. Neck supple. Cardiovascular: Normal rate, regular rhythm, systolic murmur noted and intact distal pulses.   Abdominal: Soft. Bowel sounds are normal. Exhibits no distension and no mass. There is no tenderness.  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 No weakness in hands. No decreased  sensation. No swelling or erythema. Normal range of motion. Exhibits no edema.  Lymphadenopathy:    No cervical adenopathy.  Neurological: Alert and oriented to person, place, and time. Exhibits normal muscle tone. Gait normal. Coordination normal.  Skin: Skin is warm and dry. No rash noted. Not diaphoretic. No erythema. No pallor.  Psychiatric: Mood, memory and judgment normal.  Vitals reviewed.  LABORATORY DATA: Lab Results  Component Value Date   WBC 7.4 12/26/2021   HGB 11.0 (L) 12/26/2021  HCT 35.0 (L) 12/26/2021   MCV 90.0 12/26/2021   PLT 296 12/26/2021      Chemistry      Component Value Date/Time   NA 141 12/26/2021 0929   K 4.0 12/26/2021 0929   CL 106 12/26/2021 0929   CO2 28 12/26/2021 0929   BUN 12 12/26/2021 0929   CREATININE 0.69 12/26/2021 0929   CREATININE 0.79 03/14/2020 1016      Component Value Date/Time   CALCIUM 9.8 12/26/2021 0929   ALKPHOS 85 12/26/2021 0929   AST 21 12/26/2021 0929   ALT 17 12/26/2021 0929   BILITOT 0.2 (L) 12/26/2021 0929       RADIOGRAPHIC STUDIES:  CT Chest W Contrast  Result Date: 12/24/2021 CLINICAL DATA:  Primary Cancer Type: Lung Imaging Indication: Assess response to therapy Interval therapy since last imaging? Yes Initial Cancer Diagnosis Date: 09/24/2020; Established by: Biopsy-proven Detailed Pathology: Stage IV non-small cell lung cancer, adenocarcinoma. Primary Tumor location:   Right upper lobe. Brain metastasis. Surgeries: Right upper lobectomy 09/24/2020. Left frontal craniotomy 08/24/2020. Hysterectomy. Chemotherapy: Yes; Ongoing? Yes; Most recent administration: 12/05/2021 Immunotherapy?  Yes; Type: Keytruda; Ongoing? Yes Radiation therapy? Yes Date Range: 11/07/2021 - 11/18/2021; Target: Brain Date Range: 08/23/2020 and 11/29/2020; Target: Brain * Tracking Code: BO * EXAM: CT CHEST, ABDOMEN, AND PELVIS WITH CONTRAST TECHNIQUE: Multidetector CT imaging of the chest, abdomen and pelvis was performed following the  standard protocol during bolus administration of intravenous contrast. RADIATION DOSE REDUCTION: This exam was performed according to the departmental dose-optimization program which includes automated exposure control, adjustment of the mA and/or kV according to patient size and/or use of iterative reconstruction technique. CONTRAST:  166m ISOVUE-300 IOPAMIDOL (ISOVUE-300) INJECTION 61% COMPARISON:  Most recent CT chest, abdomen and pelvis 10/22/2021. 08/08/2020 PET-CT. FINDINGS: CT CHEST FINDINGS Cardiovascular: Aortic atherosclerosis. Tortuous descending thoracic aorta. Normal heart size, without pericardial effusion. Lad and left circumflex coronary artery calcification. No central pulmonary embolism, on this non-dedicated study. Mediastinum/Nodes: No supraclavicular adenopathy. No mediastinal or hilar adenopathy. Lungs/Pleura: Trace right pleural fluid, increased. Right upper lobectomy. Mild centrilobular and paraseptal emphysema. Mucoid impaction in the anterior left lower lobe. 2-3 mm left upper lobe pulmonary nodule on 77/3 is unchanged. Left lower lobe 2-3 mm nodule on 69/3 is also unchanged. Musculoskeletal: No acute osseous abnormality. CT ABDOMEN PELVIS FINDINGS Hepatobiliary: Normal liver. 8 mm gallstone without acute cholecystitis or biliary duct dilatation. Pancreas: Mild pancreatic atrophy is within normal variation for age. Spleen: Normal in size, without focal abnormality. Adrenals/Urinary Tract: Normal right adrenal gland. Mild left adrenal thickening is unchanged. Mild renal cortical scarring bilaterally. No hydronephrosis. Normal urinary bladder. Stomach/Bowel: Normal stomach, without wall thickening. Normal colon, appendix, and terminal ileum. Normal small bowel. Vascular/Lymphatic: Aortic and branch vessel atherosclerosis. No abdominopelvic adenopathy. Reproductive: Hysterectomy.  No adnexal mass. Other: Moderate pelvic floor laxity. No significant free fluid. No free intraperitoneal air. No  evidence of omental or peritoneal disease. Musculoskeletal: Lipoma within the right iliacus tendon of 2.0 cm on 106/2. Mild right hip osteoarthritis. Probable bone island within the right ischium at 6 mm on 102/2, similar. Posterior element fixation at L3-5. IMPRESSION: 1. Status post right upper lobectomy. No findings of recurrent or metastatic disease in the chest, abdomen, or pelvis. 2. Slight increase in trace right pleural fluid. 3. Similar left-sided pulmonary nodules, favoring a benign etiology. 4. Cholelithiasis 5. Aortic atherosclerosis (ICD10-I70.0), coronary artery atherosclerosis and emphysema (ICD10-J43.9). Electronically Signed   By: KAbigail MiyamotoM.D.   On: 12/24/2021 11:35  CT Abdomen Pelvis W Contrast  Result Date: 12/24/2021 CLINICAL DATA:  Primary Cancer Type: Lung Imaging Indication: Assess response to therapy Interval therapy since last imaging? Yes Initial Cancer Diagnosis Date: 09/24/2020; Established by: Biopsy-proven Detailed Pathology: Stage IV non-small cell lung cancer, adenocarcinoma. Primary Tumor location:   Right upper lobe. Brain metastasis. Surgeries: Right upper lobectomy 09/24/2020. Left frontal craniotomy 08/24/2020. Hysterectomy. Chemotherapy: Yes; Ongoing? Yes; Most recent administration: 12/05/2021 Immunotherapy?  Yes; Type: Keytruda; Ongoing? Yes Radiation therapy? Yes Date Range: 11/07/2021 - 11/18/2021; Target: Brain Date Range: 08/23/2020 and 11/29/2020; Target: Brain * Tracking Code: BO * EXAM: CT CHEST, ABDOMEN, AND PELVIS WITH CONTRAST TECHNIQUE: Multidetector CT imaging of the chest, abdomen and pelvis was performed following the standard protocol during bolus administration of intravenous contrast. RADIATION DOSE REDUCTION: This exam was performed according to the departmental dose-optimization program which includes automated exposure control, adjustment of the mA and/or kV according to patient size and/or use of iterative reconstruction technique. CONTRAST:   18m ISOVUE-300 IOPAMIDOL (ISOVUE-300) INJECTION 61% COMPARISON:  Most recent CT chest, abdomen and pelvis 10/22/2021. 08/08/2020 PET-CT. FINDINGS: CT CHEST FINDINGS Cardiovascular: Aortic atherosclerosis. Tortuous descending thoracic aorta. Normal heart size, without pericardial effusion. Lad and left circumflex coronary artery calcification. No central pulmonary embolism, on this non-dedicated study. Mediastinum/Nodes: No supraclavicular adenopathy. No mediastinal or hilar adenopathy. Lungs/Pleura: Trace right pleural fluid, increased. Right upper lobectomy. Mild centrilobular and paraseptal emphysema. Mucoid impaction in the anterior left lower lobe. 2-3 mm left upper lobe pulmonary nodule on 77/3 is unchanged. Left lower lobe 2-3 mm nodule on 69/3 is also unchanged. Musculoskeletal: No acute osseous abnormality. CT ABDOMEN PELVIS FINDINGS Hepatobiliary: Normal liver. 8 mm gallstone without acute cholecystitis or biliary duct dilatation. Pancreas: Mild pancreatic atrophy is within normal variation for age. Spleen: Normal in size, without focal abnormality. Adrenals/Urinary Tract: Normal right adrenal gland. Mild left adrenal thickening is unchanged. Mild renal cortical scarring bilaterally. No hydronephrosis. Normal urinary bladder. Stomach/Bowel: Normal stomach, without wall thickening. Normal colon, appendix, and terminal ileum. Normal small bowel. Vascular/Lymphatic: Aortic and branch vessel atherosclerosis. No abdominopelvic adenopathy. Reproductive: Hysterectomy.  No adnexal mass. Other: Moderate pelvic floor laxity. No significant free fluid. No free intraperitoneal air. No evidence of omental or peritoneal disease. Musculoskeletal: Lipoma within the right iliacus tendon of 2.0 cm on 106/2. Mild right hip osteoarthritis. Probable bone island within the right ischium at 6 mm on 102/2, similar. Posterior element fixation at L3-5. IMPRESSION: 1. Status post right upper lobectomy. No findings of recurrent or  metastatic disease in the chest, abdomen, or pelvis. 2. Slight increase in trace right pleural fluid. 3. Similar left-sided pulmonary nodules, favoring a benign etiology. 4. Cholelithiasis 5. Aortic atherosclerosis (ICD10-I70.0), coronary artery atherosclerosis and emphysema (ICD10-J43.9). Electronically Signed   By: KAbigail MiyamotoM.D.   On: 12/24/2021 11:35     ASSESSMENT/PLAN:  This is a very pleasant 71year old African-American female diagnosed with stage IV (T3, N0, M1 B) non-small cell lung cancer, adenocarcinoma.  She presented with a right upper lobe lung mass with a solitary brain metastasis.  She was diagnosed in February 2022.  Her PD-L1 expression is 90%.  She does not have any actionable mutations.   The patient completed SRS followed by craniotomy and resection on 08/23/2020 under the care of Dr. MTammi Klippeland craniotomy under the care of Dr. OZada Finderson 08/24/2020.   She then had a robot-assisted right upper lobectomy with en bloc wedge resection of the right middle lobe and lymph node dissection under the care  of Dr. Roxan Hockey on September 24, 2020.   She underwent SRS to the two new subcentimeter brain metastases on 11/29/20.   The patient is currently undergoing systemic chemotherapy with carboplatin for an AUC of 5, Alimta 500 mg per metered squared, Keytruda 200 mg IV every 3 weeks.  She is status post 19 cycles and tolerated it well without any adverse side effects.  Starting from cycle #5, the patient started maintenance Alimta and Keytruda.   She recently had progessive metastatic disease to the brain and is status post SRS which was completed on 11/18/21.  She also had the LITT brain procedure Dr. Brett Albino at [facility] in April 20[facility].   Labs were reviewed.  Recommend that she proceed cycle #20 today as scheduled.  We will see her back for follow-up visit in 3 weeks for evaluation and repeat blood work before undergoing cycle #21.  She will continue to follow closely with radiation oncology  neurooncology for her history of metastatic disease to the brain.  Encouraged to eat small frequent meals and supplemental drinks for more calories.   The patient was advised to call immediately if she has any concerning symptoms in the interval. The patient voices understanding of current disease status and treatment options and is in agreement with the current care plan. All questions were answered. The patient knows to call the clinic with any problems, questions or concerns. We can certainly see the patient much sooner if necessary     No orders of the defined types were placed in this encounter.    The total time spent in the appointment was 20-29 minutes  Cassandra L Heilingoetter, PA-C 01/13/22

## 2022-01-16 ENCOUNTER — Other Ambulatory Visit: Payer: Self-pay

## 2022-01-16 ENCOUNTER — Inpatient Hospital Stay: Payer: Medicare HMO

## 2022-01-16 ENCOUNTER — Inpatient Hospital Stay: Payer: Medicare HMO | Admitting: Physician Assistant

## 2022-01-16 ENCOUNTER — Other Ambulatory Visit: Payer: Self-pay | Admitting: Internal Medicine

## 2022-01-16 ENCOUNTER — Inpatient Hospital Stay: Payer: Medicare HMO | Attending: Internal Medicine

## 2022-01-16 VITALS — BP 141/79 | HR 78 | Temp 98.1°F | Resp 17 | Ht 60.0 in | Wt 131.2 lb

## 2022-01-16 DIAGNOSIS — C7931 Secondary malignant neoplasm of brain: Secondary | ICD-10-CM | POA: Diagnosis not present

## 2022-01-16 DIAGNOSIS — I2510 Atherosclerotic heart disease of native coronary artery without angina pectoris: Secondary | ICD-10-CM | POA: Diagnosis not present

## 2022-01-16 DIAGNOSIS — Z87891 Personal history of nicotine dependence: Secondary | ICD-10-CM | POA: Diagnosis not present

## 2022-01-16 DIAGNOSIS — Z888 Allergy status to other drugs, medicaments and biological substances status: Secondary | ICD-10-CM | POA: Insufficient documentation

## 2022-01-16 DIAGNOSIS — Z5112 Encounter for antineoplastic immunotherapy: Secondary | ICD-10-CM | POA: Diagnosis not present

## 2022-01-16 DIAGNOSIS — C3411 Malignant neoplasm of upper lobe, right bronchus or lung: Secondary | ICD-10-CM | POA: Diagnosis not present

## 2022-01-16 DIAGNOSIS — Z5111 Encounter for antineoplastic chemotherapy: Secondary | ICD-10-CM

## 2022-01-16 DIAGNOSIS — Z902 Acquired absence of lung [part of]: Secondary | ICD-10-CM | POA: Insufficient documentation

## 2022-01-16 DIAGNOSIS — J432 Centrilobular emphysema: Secondary | ICD-10-CM | POA: Insufficient documentation

## 2022-01-16 DIAGNOSIS — C3491 Malignant neoplasm of unspecified part of right bronchus or lung: Secondary | ICD-10-CM

## 2022-01-16 DIAGNOSIS — Z79899 Other long term (current) drug therapy: Secondary | ICD-10-CM | POA: Insufficient documentation

## 2022-01-16 DIAGNOSIS — F419 Anxiety disorder, unspecified: Secondary | ICD-10-CM | POA: Diagnosis not present

## 2022-01-16 DIAGNOSIS — K219 Gastro-esophageal reflux disease without esophagitis: Secondary | ICD-10-CM | POA: Insufficient documentation

## 2022-01-16 DIAGNOSIS — E119 Type 2 diabetes mellitus without complications: Secondary | ICD-10-CM | POA: Diagnosis not present

## 2022-01-16 DIAGNOSIS — M1611 Unilateral primary osteoarthritis, right hip: Secondary | ICD-10-CM | POA: Insufficient documentation

## 2022-01-16 DIAGNOSIS — F32A Depression, unspecified: Secondary | ICD-10-CM | POA: Insufficient documentation

## 2022-01-16 DIAGNOSIS — R61 Generalized hyperhidrosis: Secondary | ICD-10-CM | POA: Diagnosis not present

## 2022-01-16 DIAGNOSIS — K8020 Calculus of gallbladder without cholecystitis without obstruction: Secondary | ICD-10-CM | POA: Diagnosis not present

## 2022-01-16 DIAGNOSIS — Z8719 Personal history of other diseases of the digestive system: Secondary | ICD-10-CM | POA: Diagnosis not present

## 2022-01-16 DIAGNOSIS — Z7952 Long term (current) use of systemic steroids: Secondary | ICD-10-CM | POA: Insufficient documentation

## 2022-01-16 DIAGNOSIS — I700 Atherosclerosis of aorta: Secondary | ICD-10-CM | POA: Diagnosis not present

## 2022-01-16 DIAGNOSIS — E785 Hyperlipidemia, unspecified: Secondary | ICD-10-CM | POA: Insufficient documentation

## 2022-01-16 LAB — CBC WITH DIFFERENTIAL (CANCER CENTER ONLY)
Abs Immature Granulocytes: 0.02 10*3/uL (ref 0.00–0.07)
Basophils Absolute: 0 10*3/uL (ref 0.0–0.1)
Basophils Relative: 0 %
Eosinophils Absolute: 0 10*3/uL (ref 0.0–0.5)
Eosinophils Relative: 0 %
HCT: 34.9 % — ABNORMAL LOW (ref 36.0–46.0)
Hemoglobin: 11.2 g/dL — ABNORMAL LOW (ref 12.0–15.0)
Immature Granulocytes: 0 %
Lymphocytes Relative: 25 %
Lymphs Abs: 1.4 10*3/uL (ref 0.7–4.0)
MCH: 28.6 pg (ref 26.0–34.0)
MCHC: 32.1 g/dL (ref 30.0–36.0)
MCV: 89 fL (ref 80.0–100.0)
Monocytes Absolute: 0.6 10*3/uL (ref 0.1–1.0)
Monocytes Relative: 10 %
Neutro Abs: 3.5 10*3/uL (ref 1.7–7.7)
Neutrophils Relative %: 65 %
Platelet Count: 240 10*3/uL (ref 150–400)
RBC: 3.92 MIL/uL (ref 3.87–5.11)
RDW: 13.6 % (ref 11.5–15.5)
WBC Count: 5.6 10*3/uL (ref 4.0–10.5)
nRBC: 0 % (ref 0.0–0.2)

## 2022-01-16 LAB — CMP (CANCER CENTER ONLY)
ALT: 14 U/L (ref 0–44)
AST: 20 U/L (ref 15–41)
Albumin: 4.1 g/dL (ref 3.5–5.0)
Alkaline Phosphatase: 68 U/L (ref 38–126)
Anion gap: 6 (ref 5–15)
BUN: 12 mg/dL (ref 8–23)
CO2: 28 mmol/L (ref 22–32)
Calcium: 9.8 mg/dL (ref 8.9–10.3)
Chloride: 104 mmol/L (ref 98–111)
Creatinine: 0.73 mg/dL (ref 0.44–1.00)
GFR, Estimated: >60 mL/min (ref >60)
Glucose, Bld: 139 mg/dL — ABNORMAL HIGH (ref 70–99)
Potassium: 3.8 mmol/L (ref 3.5–5.1)
Sodium: 138 mmol/L (ref 135–145)
Total Bilirubin: 0.3 mg/dL (ref 0.3–1.2)
Total Protein: 6.8 g/dL (ref 6.5–8.1)

## 2022-01-16 LAB — TSH: TSH: 0.627 u[IU]/mL (ref 0.350–4.500)

## 2022-01-16 MED ORDER — PROCHLORPERAZINE MALEATE 10 MG PO TABS
10.0000 mg | ORAL_TABLET | Freq: Once | ORAL | Status: AC
  Administered 2022-01-16: 10 mg via ORAL
  Filled 2022-01-16: qty 1

## 2022-01-16 MED ORDER — SODIUM CHLORIDE 0.9 % IV SOLN: Freq: Once | INTRAVENOUS | Status: AC

## 2022-01-16 MED ORDER — CYANOCOBALAMIN 1000 MCG/ML IJ SOLN
1000.0000 ug | Freq: Once | INTRAMUSCULAR | Status: AC
  Administered 2022-01-16: 1000 ug via INTRAMUSCULAR
  Filled 2022-01-16: qty 1

## 2022-01-16 MED ORDER — SODIUM CHLORIDE 0.9 % IV SOLN
200.0000 mg | Freq: Once | INTRAVENOUS | Status: AC
  Administered 2022-01-16: 200 mg via INTRAVENOUS
  Filled 2022-01-16: qty 200

## 2022-01-16 MED ORDER — SODIUM CHLORIDE 0.9 % IV SOLN
800.0000 mg | Freq: Once | INTRAVENOUS | Status: AC
  Administered 2022-01-16: 800 mg via INTRAVENOUS
  Filled 2022-01-16: qty 20

## 2022-01-17 ENCOUNTER — Other Ambulatory Visit: Payer: Self-pay

## 2022-01-20 ENCOUNTER — Other Ambulatory Visit: Payer: Self-pay

## 2022-01-20 ENCOUNTER — Encounter: Payer: Self-pay | Admitting: Internal Medicine

## 2022-01-21 ENCOUNTER — Other Ambulatory Visit (HOSPITAL_COMMUNITY): Payer: Self-pay | Admitting: Psychiatry

## 2022-01-21 ENCOUNTER — Other Ambulatory Visit: Payer: Self-pay

## 2022-01-24 ENCOUNTER — Other Ambulatory Visit: Payer: Self-pay | Admitting: Internal Medicine

## 2022-01-24 DIAGNOSIS — C3490 Malignant neoplasm of unspecified part of unspecified bronchus or lung: Secondary | ICD-10-CM | POA: Diagnosis not present

## 2022-01-24 DIAGNOSIS — C7931 Secondary malignant neoplasm of brain: Secondary | ICD-10-CM | POA: Diagnosis not present

## 2022-01-27 ENCOUNTER — Encounter: Payer: Self-pay | Admitting: Internal Medicine

## 2022-01-27 DIAGNOSIS — C3491 Malignant neoplasm of unspecified part of right bronchus or lung: Secondary | ICD-10-CM

## 2022-01-27 DIAGNOSIS — I10 Essential (primary) hypertension: Secondary | ICD-10-CM

## 2022-01-27 MED ORDER — ALPRAZOLAM ER 0.5 MG PO TB24: 0.5000 mg | ORAL_TABLET | Freq: Every day | ORAL | 5 refills | Status: DC

## 2022-01-28 ENCOUNTER — Encounter: Payer: Self-pay | Admitting: Internal Medicine

## 2022-02-03 ENCOUNTER — Encounter: Payer: Self-pay | Admitting: Internal Medicine

## 2022-02-05 ENCOUNTER — Other Ambulatory Visit: Payer: Self-pay | Admitting: Internal Medicine

## 2022-02-06 ENCOUNTER — Encounter: Payer: Self-pay | Admitting: Internal Medicine

## 2022-02-06 ENCOUNTER — Inpatient Hospital Stay: Payer: Medicare HMO | Attending: Internal Medicine | Admitting: Internal Medicine

## 2022-02-06 ENCOUNTER — Other Ambulatory Visit: Payer: Self-pay

## 2022-02-06 ENCOUNTER — Inpatient Hospital Stay: Payer: Medicare HMO

## 2022-02-06 VITALS — BP 141/73 | HR 67 | Temp 98.7°F | Resp 16

## 2022-02-06 VITALS — BP 136/77 | HR 80 | Temp 98.6°F | Resp 17 | Wt 131.4 lb

## 2022-02-06 DIAGNOSIS — F32A Depression, unspecified: Secondary | ICD-10-CM | POA: Diagnosis not present

## 2022-02-06 DIAGNOSIS — Z79899 Other long term (current) drug therapy: Secondary | ICD-10-CM | POA: Insufficient documentation

## 2022-02-06 DIAGNOSIS — Z87891 Personal history of nicotine dependence: Secondary | ICD-10-CM | POA: Insufficient documentation

## 2022-02-06 DIAGNOSIS — F419 Anxiety disorder, unspecified: Secondary | ICD-10-CM | POA: Diagnosis not present

## 2022-02-06 DIAGNOSIS — K219 Gastro-esophageal reflux disease without esophagitis: Secondary | ICD-10-CM | POA: Diagnosis not present

## 2022-02-06 DIAGNOSIS — Z888 Allergy status to other drugs, medicaments and biological substances status: Secondary | ICD-10-CM | POA: Diagnosis not present

## 2022-02-06 DIAGNOSIS — Z5111 Encounter for antineoplastic chemotherapy: Secondary | ICD-10-CM | POA: Diagnosis not present

## 2022-02-06 DIAGNOSIS — C3411 Malignant neoplasm of upper lobe, right bronchus or lung: Secondary | ICD-10-CM

## 2022-02-06 DIAGNOSIS — R350 Frequency of micturition: Secondary | ICD-10-CM | POA: Insufficient documentation

## 2022-02-06 DIAGNOSIS — C7931 Secondary malignant neoplasm of brain: Secondary | ICD-10-CM | POA: Diagnosis not present

## 2022-02-06 DIAGNOSIS — Z5112 Encounter for antineoplastic immunotherapy: Secondary | ICD-10-CM | POA: Diagnosis not present

## 2022-02-06 DIAGNOSIS — Z90710 Acquired absence of both cervix and uterus: Secondary | ICD-10-CM | POA: Insufficient documentation

## 2022-02-06 DIAGNOSIS — E785 Hyperlipidemia, unspecified: Secondary | ICD-10-CM | POA: Diagnosis not present

## 2022-02-06 DIAGNOSIS — R569 Unspecified convulsions: Secondary | ICD-10-CM | POA: Insufficient documentation

## 2022-02-06 DIAGNOSIS — Z8719 Personal history of other diseases of the digestive system: Secondary | ICD-10-CM | POA: Diagnosis not present

## 2022-02-06 DIAGNOSIS — C3490 Malignant neoplasm of unspecified part of unspecified bronchus or lung: Secondary | ICD-10-CM

## 2022-02-06 LAB — CBC WITH DIFFERENTIAL (CANCER CENTER ONLY)
Abs Immature Granulocytes: 0.02 10*3/uL (ref 0.00–0.07)
Basophils Absolute: 0 10*3/uL (ref 0.0–0.1)
Basophils Relative: 1 %
Eosinophils Absolute: 0 10*3/uL (ref 0.0–0.5)
Eosinophils Relative: 0 %
HCT: 35.5 % — ABNORMAL LOW (ref 36.0–46.0)
Hemoglobin: 11.5 g/dL — ABNORMAL LOW (ref 12.0–15.0)
Immature Granulocytes: 0 %
Lymphocytes Relative: 30 %
Lymphs Abs: 1.5 10*3/uL (ref 0.7–4.0)
MCH: 28.5 pg (ref 26.0–34.0)
MCHC: 32.4 g/dL (ref 30.0–36.0)
MCV: 87.9 fL (ref 80.0–100.0)
Monocytes Absolute: 0.6 10*3/uL (ref 0.1–1.0)
Monocytes Relative: 13 %
Neutro Abs: 2.8 10*3/uL (ref 1.7–7.7)
Neutrophils Relative %: 56 %
Platelet Count: 270 10*3/uL (ref 150–400)
RBC: 4.04 MIL/uL (ref 3.87–5.11)
RDW: 12.8 % (ref 11.5–15.5)
WBC Count: 5 10*3/uL (ref 4.0–10.5)
nRBC: 0 % (ref 0.0–0.2)

## 2022-02-06 LAB — CMP (CANCER CENTER ONLY)
ALT: 16 U/L (ref 0–44)
AST: 22 U/L (ref 15–41)
Albumin: 4 g/dL (ref 3.5–5.0)
Alkaline Phosphatase: 74 U/L (ref 38–126)
Anion gap: 5 (ref 5–15)
BUN: 9 mg/dL (ref 8–23)
CO2: 31 mmol/L (ref 22–32)
Calcium: 9.5 mg/dL (ref 8.9–10.3)
Chloride: 105 mmol/L (ref 98–111)
Creatinine: 0.73 mg/dL (ref 0.44–1.00)
GFR, Estimated: >60 mL/min (ref >60)
Glucose, Bld: 129 mg/dL — ABNORMAL HIGH (ref 70–99)
Potassium: 3.9 mmol/L (ref 3.5–5.1)
Sodium: 141 mmol/L (ref 135–145)
Total Bilirubin: 0.2 mg/dL — ABNORMAL LOW (ref 0.3–1.2)
Total Protein: 7 g/dL (ref 6.5–8.1)

## 2022-02-06 LAB — TSH: TSH: 0.7 u[IU]/mL (ref 0.350–4.500)

## 2022-02-06 MED ORDER — CYANOCOBALAMIN 1000 MCG/ML IJ SOLN
1000.0000 ug | Freq: Once | INTRAMUSCULAR | Status: AC
  Administered 2022-02-06: 1000 ug via INTRAMUSCULAR

## 2022-02-06 MED ORDER — SODIUM CHLORIDE 0.9 % IV SOLN
200.0000 mg | Freq: Once | INTRAVENOUS | Status: AC
  Administered 2022-02-06: 200 mg via INTRAVENOUS
  Filled 2022-02-06: qty 200

## 2022-02-06 MED ORDER — SODIUM CHLORIDE 0.9 % IV SOLN: Freq: Once | INTRAVENOUS | Status: AC

## 2022-02-06 MED ORDER — SODIUM CHLORIDE 0.9 % IV SOLN
500.0000 mg/m2 | Freq: Once | INTRAVENOUS | Status: AC
  Administered 2022-02-06: 800 mg via INTRAVENOUS
  Filled 2022-02-06: qty 20

## 2022-02-06 MED ORDER — PROCHLORPERAZINE MALEATE 10 MG PO TABS
10.0000 mg | ORAL_TABLET | Freq: Once | ORAL | Status: AC
  Administered 2022-02-06: 10 mg via ORAL

## 2022-02-06 NOTE — Patient Instructions (Signed)
[name]   Discharge Instructions: Thank you for choosing [facility] to provide your oncology and hematology care.   If you have a lab appointment with the [facility], please go directly to the [facility] and check in at the registration area.   Wear comfortable clothing and clothing appropriate for easy access to any Portacath or PICC line.   We strive to give you quality time with your provider. You may need to reschedule your appointment if you arrive late (15 or more minutes).  Arriving late affects you and other patients whose appointments are after yours.  Also, if you miss three or more appointments without notifying the office, you may be dismissed from the clinic at the provider's discretion.      For prescription refill requests, have your pharmacy contact our office and allow 72 hours for refills to be completed.    Today you received the following chemotherapy and/or immunotherapy agents: Pembrolizumab (Keytruda) and Pemetrexed (Alimta)       To help prevent nausea and vomiting after your treatment, we encourage you to take your nausea medication as directed.  BELOW ARE SYMPTOMS THAT SHOULD BE REPORTED IMMEDIATELY: *FEVER GREATER THAN 100.4 F (38 C) OR HIGHER *CHILLS OR SWEATING *NAUSEA AND VOMITING THAT IS NOT CONTROLLED WITH YOUR NAUSEA MEDICATION *UNUSUAL SHORTNESS OF BREATH *UNUSUAL BRUISING OR BLEEDING *URINARY PROBLEMS (pain or burning when urinating, or frequent urination) *BOWEL PROBLEMS (unusual diarrhea, constipation, pain near the anus) TENDERNESS IN MOUTH AND THROAT WITH OR WITHOUT PRESENCE OF ULCERS (sore throat, sores in mouth, or a toothache) UNUSUAL RASH, SWELLING OR PAIN  UNUSUAL VAGINAL DISCHARGE OR ITCHING   Items with * indicate a potential emergency and should be followed up as soon as possible or go to the Emergency Department if any problems should occur.  Please show the CHEMOTHERAPY ALERT CARD  or IMMUNOTHERAPY ALERT CARD at check-in to the Emergency Department and triage nurse.  Should you have questions after your visit or need to cancel or reschedule your appointment, please contact [facility]  Dept: [phone]  and follow the prompts.  Office hours are 8:00 a.m. to 4:30 p.m. Monday - Friday. Please note that voicemails left after 4:00 p.m. may not be returned until the following business day.  We are closed weekends and major holidays. You have access to a nurse at all times for urgent questions. Please call the main number to the clinic Dept: [phone] and follow the prompts.   For any non-urgent questions, you may also contact your provider using MyChart. We now offer e-Visits for anyone 63 and older to request care online for non-urgent symptoms. For details visit [email or web address].   Also download the MyChart app! Go to the app store, search "MyChart", open the app, select [facility], and log in with your MyChart username and password.  Masks are optional in the cancer centers. If you would like for your care team to wear a mask while they are taking care of you, please let them know. You may have one support person who is at least 73 years old accompany you for your appointments.

## 2022-02-06 NOTE — Progress Notes (Signed)
[facility] Telephone:([phone]) [phone]   Fax:([phone]) [phone]  OFFICE PROGRESS NOTE  [name], MD [place] [address]  DIAGNOSIS: Stage IV non-small cell lung cancer (T, N0, M1C) adenocarcinoma.  The patient presented with a and solitary brain metastasis.  The patient was diagnosed in February 2022.  Biomarker Findings Microsatellite status - MS-Stable Tumor Mutational Burden - 8 Muts/Mb Genomic Findings For a complete list of the genes assayed, please refer to the Appendix. KRAS G12V KEAP1 S224F TP53 P151T 7 Disease relevant genes with no reportable alterations: ALK, BRAF, EGFR, ERBB2, MET, RET, ROS1  PDL1 Expression: 50%    PRIOR THERAPY: 1) SRS to the metastatic brain lesion on 08/23/2020 under the care of Dr. Tammi Klippel and craniotomy under the care of Dr. Zada Finders scheduled for 08/24/2020. 2) S/p robotic assisted right upper lobectomy with en bloc wedge resection of the right middle lobe and lymph node dissection under the care of Dr. Roxan Hockey on September 24, 2020. 3) status post SRS to a new subcentimeter brain lesions under the care of Dr. Lisbeth Renshaw. 4) SRS to brain metastasis.   CURRENT THERAPY: Systemic chemotherapy with carboplatin for AUC of 5, Alimta 500 Mg/M2 and Keytruda 200 mg IV every 3 weeks.  First dose Nov 07, 2020.  Status post 20 cycles.  Starting from cycle #5 the patient will be on maintenance treatment with Alimta and Keytruda every 3 weeks.   INTERVAL HISTORY: Hannah [name] 71 y.o. female returns to the clinic today for follow-up visit accompanied by her daughter.  The patient is feeling fine today with no concerning complaints except for increased urinary frequency especially at nighttime.  She denied having any dysuria or hematuria.  She has no chest pain, shortness of breath, cough or hemoptysis.  She denied having any fever or chills.  She has no nausea, vomiting, diarrhea or constipation.  She has no headache or  visual changes.  She has been tolerating her treatment fairly well.  The patient is here today for evaluation before starting cycle #21.  MEDICAL HISTORY: Past Medical History:  Diagnosis Date   Allergy    Anemia    Anxiety    Arthritis    Carpal tunnel syndrome    Constipation    senna C stool softeners help    Depression    Diverticulosis    Dyslipidemia    External hemorrhoids    GERD (gastroesophageal reflux disease)    Heart murmur    mild-moderate AR   Hiatal hernia    Hyperlipidemia    on meds    Hypertension    Internal hemorrhoids    lung ca with brain mets 07/2020   Osteoarthritis    Pre-diabetes    PVD (peripheral vascular disease) (HCC)    moderate carotid disease   RBBB    Smoker    Vocal cord polyps     ALLERGIES:  is allergic to amlodipine, chantix [varenicline tartrate], clarithromycin, lisinopril, simvastatin, wellbutrin [bupropion hcl], lipitor [atorvastatin], and sertraline.  MEDICATIONS:  Current Outpatient Medications  Medication Sig Dispense Refill   acetaminophen (TYLENOL) 325 MG tablet Take 325 mg by mouth every 6 (six) hours as needed for moderate pain.     ALPRAZolam (XANAX XR) 0.5 MG 24 hr tablet Take 1 tablet (0.5 mg total) by mouth daily. 30 tablet 5   ALPRAZolam (XANAX) 0.5 MG tablet TAKE 1 TABLET BY MOUTH THREE TIMES DAILY AS NEEDED FOR ANXIETY 90 tablet 0  Ascorbic Acid (VITAMIN C) 1000 MG tablet Take 1,000 mg by mouth daily.     aspirin 81 MG tablet Take 1 tablet (81 mg total) by mouth daily. Restart on 08/31/20 30 tablet    bisacodyl (DULCOLAX) 5 MG EC tablet Take 5 mg by mouth daily as needed for moderate constipation.     carboxymethylcellulose (REFRESH PLUS) 0.5 % SOLN Place 1-2 drops into both eyes 3 (three) times daily as needed (dry eyes).     Cholecalciferol (D3 PO) Take 1 capsule by mouth daily.     citalopram (CELEXA) 10 MG tablet Take 1 tablet (10 mg total) by mouth daily. 90 tablet 1   dexamethasone (DECADRON) 1 MG tablet  Take 1 tablet (1 mg total) by mouth daily. 60 tablet 1   fluticasone (FLONASE) 50 MCG/ACT nasal spray PLACE 2 SPRAYS INTO BOTH NOSTRILS DAILY. (Patient taking differently: Place 2 sprays into both nostrils daily as needed for allergies.) 48 g 1   folic acid (FOLVITE) 1 MG tablet Take 1 tablet (1 mg total) by mouth daily. 30 tablet 3   Lacosamide 100 MG TABS Take 1 tablet (100 mg total) by mouth 2 (two) times daily. 60 tablet 3   levETIRAcetam (KEPPRA) 100 MG/ML solution TAKE 15 ML BY MOUTH  TWICE DAILY 473 mL 0   loratadine (CLARITIN) 10 MG tablet Take 10 mg by mouth daily as needed for allergies.     magnesium hydroxide (MILK OF MAGNESIA) 800 MG/5ML suspension Take 30 mLs by mouth daily as needed for constipation.     Menthol, Topical Analgesic, (BIOFREEZE EX) Apply 1 application topically as needed (pain).     olopatadine (PATANOL) 0.1 % ophthalmic solution Place 1 drop into both eyes 2 (two) times daily as needed for allergies.     omeprazole (PRILOSEC) 40 MG capsule TAKE 1 CAPSULE TWICE DAILY BEFORE MEALS (Patient taking differently: Take 40 mg by mouth daily.) 180 capsule 0   oxyCODONE (OXY IR/ROXICODONE) 5 MG immediate release tablet Take 5 mg by mouth every 8 (eight) hours as needed.     polyethylene glycol (MIRALAX / GLYCOLAX) 17 g packet Take 17 g by mouth 2 (two) times daily. 72 each 0   pravastatin (PRAVACHOL) 40 MG tablet TAKE 1 TABLET (40 MG TOTAL) BY MOUTH EVERY EVENING. (Patient taking differently: Take 40 mg by mouth daily.) 90 tablet 0   Probiotic Product (PROBIOTIC DAILY PO) Take 1 capsule by mouth daily.     prochlorperazine (COMPAZINE) 10 MG tablet TAKE 1 TABLET BY MOUTH EVERY 6 HOURS AS NEEDED FOR NAUSEA OR VOMITING 30 tablet 0   simethicone (MYLICON) 80 MG chewable tablet Chew 1 tablet (80 mg total) by mouth 4 (four) times daily as needed for flatulence (Bloating). 30 tablet 0   No current facility-administered medications for this visit.    SURGICAL HISTORY:  Past Surgical  History:  Procedure Laterality Date   ABDOMINAL HYSTERECTOMY  4920   APPLICATION OF CRANIAL NAVIGATION N/A 08/24/2020   Procedure: APPLICATION OF CRANIAL NAVIGATION;  Surgeon: Judith Part, MD;  Location: [facility];  Service: Neurosurgery;  Laterality: N/A;   COLONOSCOPY     COLONOSCOPY W/ POLYPECTOMY  2009   CRANIOTOMY Left 08/24/2020   Procedure: Left Craniotomy for Tumor Resection with Brainlab;  Surgeon: Judith Part, MD;  Location: [facility];  Service: Neurosurgery;  Laterality: Left;   HEMORRHOID SURGERY     INTERCOSTAL NERVE BLOCK Right 09/24/2020   Procedure: INTERCOSTAL NERVE BLOCK;  Surgeon: Melrose Nakayama, MD;  Location: [facility];  Service: Thoracic;  Laterality: Right;   JOINT REPLACEMENT     LYMPH NODE DISSECTION Right 09/24/2020   Procedure: LYMPH NODE DISSECTION;  Surgeon: Melrose Nakayama, MD;  Location: [facility];  Service: Thoracic;  Laterality: Right;   POLYPECTOMY     right total knee arthroplasty     Dr. Emeterio Reeve 06-04-18   [facility]  08/16/2009   TOTAL KNEE ARTHROPLASTY Left 02/13/2014   Procedure: TOTAL KNEE ARTHROPLASTY;  Surgeon: Alta Corning, MD;  Location: [facility];  Service: Orthopedics;  Laterality: Left;   TOTAL KNEE ARTHROPLASTY Right 06/04/2018   Procedure: RIGHT TOTAL KNEE ARTHROPLASTY;  Surgeon: Dorna Leitz, MD;  Location: [facility];  Service: Orthopedics;  Laterality: Right;  Adductor Block   TUBAL LIGATION      REVIEW OF SYSTEMS:  A comprehensive review of systems was negative except for: Genitourinary: positive for frequency   PHYSICAL EXAMINATION: General appearance: alert, cooperative, fatigued, and no distress Head: Normocephalic, without obvious abnormality, atraumatic Neck: no adenopathy, no JVD, supple, symmetrical, trachea midline, and thyroid not enlarged, symmetric, no tenderness/mass/nodules Lymph nodes: Cervical, supraclavicular, and axillary nodes normal. Resp: clear to auscultation bilaterally Back: symmetric, no curvature. ROM  normal. No CVA tenderness. Cardio: regular rate and rhythm, S1, S2 normal, no murmur, click, rub or gallop GI: soft, non-tender; bowel sounds normal; no masses,  no organomegaly Extremities: extremities normal, atraumatic, no cyanosis or edema  ECOG PERFORMANCE STATUS: 1 - Symptomatic but completely ambulatory  Blood pressure 136/77, pulse 80, temperature 98.6 F (37 C), temperature source Oral, resp. rate 17, weight 131 lb 6 oz (59.6 kg), SpO2 98 %.  LABORATORY DATA: Lab Results  Component Value Date   WBC 5.0 02/06/2022   HGB 11.5 (L) 02/06/2022   HCT 35.5 (L) 02/06/2022   MCV 87.9 02/06/2022   PLT 270 02/06/2022      Chemistry      Component Value Date/Time   NA 141 02/06/2022 0842   K 3.9 02/06/2022 0842   CL 105 02/06/2022 0842   CO2 31 02/06/2022 0842   BUN 9 02/06/2022 0842   CREATININE 0.73 02/06/2022 0842   CREATININE 0.79 03/14/2020 1016      Component Value Date/Time   CALCIUM 9.5 02/06/2022 0842   ALKPHOS 74 02/06/2022 0842   AST 22 02/06/2022 0842   ALT 16 02/06/2022 0842   BILITOT 0.2 (L) 02/06/2022 0842       RADIOGRAPHIC STUDIES: No results found.   ASSESSMENT AND PLAN: This is a very pleasant 71 years old African-American female diagnosed with a stage IV (T3, N0, M1b) non-small cell lung cancer, adenocarcinoma presented with right upper lobe lung mass with solitary brain metastasis diagnosed in February 2022 status post SRS to the brain lesion followed by craniotomy and resection. The patient also has a solitary lung mass. S/p robotic assisted right upper lobectomy with en bloc wedge resection of the right middle lobe and lymph node dissection under the care of Dr. Roxan Hockey on September 24, 2020. She also underwent SRS treatment to a new subcentimeter brain lesions under the care of Dr. Lisbeth Renshaw She is currently undergoing systemic chemotherapy with carboplatin for AUC of 5, Alimta 500 Mg/M2 and Keytruda 200 Mg IV every 3 weeks status post 20 cycles.   Starting from cycle #5 the patient will be on maintenance treatment with Alimta and Keytruda every 3 weeks. The patient has been tolerating this treatment well with no concerning adverse effects. I recommended for her to proceed  with cycle #21 today as planned. I will see her back for follow-up visit in 3 weeks for evaluation before the next cycle of her treatment with repeat CT scan of the chest, abdomen and pelvis for restaging of her disease. For the brain metastasis, she is followed by radiation oncology and neuro-oncology. For the urine frequency, I advised the patient to call if she notices any significant dysuria, hematuria or any other significant change in her urine.  She may reach to [place] or her primary care physician for further evaluation. The patient was advised to call immediately if she has any concerning symptoms in the interval.  The patient voices understanding of current disease status and treatment options and is in agreement with the current care plan.  All questions were answered. The patient knows to call the clinic with any problems, questions or concerns. We can certainly see the patient much sooner if necessary.  Disclaimer: This note was dictated with voice recognition software. Similar sounding words can inadvertently be transcribed and may not be corrected upon review.

## 2022-02-07 ENCOUNTER — Other Ambulatory Visit: Payer: Self-pay

## 2022-02-10 ENCOUNTER — Other Ambulatory Visit: Payer: Self-pay

## 2022-02-10 ENCOUNTER — Inpatient Hospital Stay: Payer: Medicare HMO

## 2022-02-10 NOTE — Progress Notes (Signed)
[facility] Social Work  Patient presented to [facility]  to review and complete healthcare advance directives.  Clinical Social Worker met with patient and her daughter, Hannah.  The patient designated Hannah [name] as their primary healthcare agent and Hannah [name] as their secondary agent.  Patient also completed healthcare living will.    Documents were notarized and copies made for patient/family. Clinical Social Worker will send documents to medical records to be scanned into patient's chart. Clinical Social Worker encouraged patient/family to contact with any additional questions or concerns.   Margaree Mackintosh, LCSW Clinical Social Worker [facility]

## 2022-02-14 ENCOUNTER — Telehealth (INDEPENDENT_AMBULATORY_CARE_PROVIDER_SITE_OTHER): Payer: Medicare HMO | Admitting: Nurse Practitioner

## 2022-02-14 ENCOUNTER — Other Ambulatory Visit: Payer: Medicare HMO

## 2022-02-14 ENCOUNTER — Encounter: Payer: Self-pay | Admitting: Nurse Practitioner

## 2022-02-14 ENCOUNTER — Other Ambulatory Visit: Payer: Self-pay | Admitting: Nurse Practitioner

## 2022-02-14 ENCOUNTER — Encounter: Payer: Self-pay | Admitting: Internal Medicine

## 2022-02-14 ENCOUNTER — Other Ambulatory Visit (INDEPENDENT_AMBULATORY_CARE_PROVIDER_SITE_OTHER): Payer: Medicare HMO

## 2022-02-14 DIAGNOSIS — R350 Frequency of micturition: Secondary | ICD-10-CM

## 2022-02-14 DIAGNOSIS — R300 Dysuria: Secondary | ICD-10-CM | POA: Diagnosis not present

## 2022-02-14 LAB — URINALYSIS WITH CULTURE, IF INDICATED
Bilirubin Urine: NEGATIVE
Hgb urine dipstick: NEGATIVE
Ketones, ur: NEGATIVE
Leukocytes,Ua: NEGATIVE
Nitrite: POSITIVE — AB
Specific Gravity, Urine: 1.015 (ref 1.000–1.030)
Total Protein, Urine: NEGATIVE
Urine Glucose: NEGATIVE
Urobilinogen, UA: 0.2 (ref 0.0–1.0)
pH: 6 (ref 5.0–8.0)

## 2022-02-14 MED ORDER — SULFAMETHOXAZOLE-TRIMETHOPRIM 800-160 MG PO TABS: 1.0000 | ORAL_TABLET | Freq: Two times a day (BID) | ORAL | 0 refills | Status: DC

## 2022-02-14 MED ORDER — CEPHALEXIN 500 MG PO CAPS: 500.0000 mg | ORAL_CAPSULE | Freq: Four times a day (QID) | ORAL | 0 refills | Status: DC

## 2022-02-14 NOTE — Progress Notes (Signed)
Established Patient Office Visit  An audio-only tele-health visit was completed today for this patient. I connected with  Hannah [name] on 02/18/22 utilizing audio-only technology and verified that I am speaking with the correct person using two identifiers. The patient was located at their care, and I was located at home during the encounter. I discussed the limitations of evaluation and management by telemedicine. The patient expressed understanding and agreed to proceed.     Subjective   Patient ID: Hannah [name], female    DOB: 1951-03-17  Age: 71 y.o. MRN: [id number]  Chief Complaint  Patient presents with   Urinary Tract Infection     Patient arrives for virtual visit via telephone with assistance from her daughter [name] for the above.  They report symptoms started 2 days ago.  Patient is experiencing increased urinary frequency and nocturia.  She is also been having worsening word recall.  Patient and daughter tell me that these are similar symptoms that patient has had in the past when she had UTI.  Patient denies dysuria or hematuria    Review of Systems  Constitutional:  Positive for chills. Negative for fever.  Genitourinary:  Positive for frequency. Negative for dysuria and hematuria.       (+) nocturia      Objective:     There were no vitals taken for this visit.   Physical Exam Comprehensive physical exam not completed today as office visit was conducted remotely.  Patient sounded well over the phone.  Patient was alert and oriented, and appeared to have appropriate judgment.   Results for orders placed or performed in visit on 02/14/22  Urine culture   Specimen: Urine  Result Value Ref Range   MICRO NUMBER: [id number]    SPECIMEN QUALITY: Adequate    Sample Source URINE    STATUS: FINAL    Result:      Mixed genital flora isolated. These superficial bacteria are not indicative of a urinary tract infection. No further organism identification is  warranted on this specimen. If clinically indicated, recollect clean-catch, mid-stream urine and transfer  immediately to Urine Culture Transport Tube.   Results for orders placed or performed in visit on 02/14/22  Urinalysis with Culture, if indicated   Specimen: Urine  Result Value Ref Range   Color, Urine YELLOW Yellow;Lt. Yellow;Straw;Dark Yellow;Amber;Green;Red;Brown   APPearance CLEAR Clear;Turbid;Slightly Cloudy;Cloudy   Specific Gravity, Urine 1.015 1.000 - 1.030   pH 6.0 5.0 - 8.0   Total Protein, Urine NEGATIVE Negative   Urine Glucose NEGATIVE Negative   Ketones, ur NEGATIVE Negative   Bilirubin Urine NEGATIVE Negative   Hgb urine dipstick NEGATIVE Negative   Urobilinogen, UA 0.2 0.0 - 1.0   Leukocytes,Ua NEGATIVE Negative   Nitrite POSITIVE (A) Negative   WBC, UA 0-2/hpf 0-2/hpf   RBC / HPF 0-2/hpf 0-2/hpf   Squamous Epithelial / LPF Rare(0-4/hpf) Rare(0-4/hpf)   Bacteria, UA Many(>50/hpf) (A) None   Ca Oxalate Crys, UA Presence of (A) None      The 10-year ASCVD risk score (Arnett DK, et al., 2019) is: 25.7%    Assessment & Plan:   Problem List Items Addressed This Visit       Other   Urinary frequency - Primary    Acute, patient has dropped off urine sample to office, waiting for results of UA with reflex to culture.  As we are going into the weekend will initiate patient on Bactrim, 1 tablet by mouth twice a day for  5 days.  Patient will be notified if if necessary to change antibiotic based on pending urine test results.  Patient and daughter educated on possible side effects of Bactrim.       Return if symptoms worsen or fail to improve. Total time spent on the phone for this visit was 14 minutes.    Ailene Ards, NP

## 2022-02-14 NOTE — Assessment & Plan Note (Signed)
Acute, patient has dropped off urine sample to office, waiting for results of UA with reflex to culture.  As we are going into the weekend will initiate patient on Bactrim, 1 tablet by mouth twice a day for 5 days.  Patient will be notified if if necessary to change antibiotic based on pending urine test results.  Patient and daughter educated on possible side effects of Bactrim.

## 2022-02-14 NOTE — Progress Notes (Signed)
Urinalysis order

## 2022-02-17 ENCOUNTER — Inpatient Hospital Stay (HOSPITAL_BASED_OUTPATIENT_CLINIC_OR_DEPARTMENT_OTHER): Payer: Medicare HMO | Admitting: Internal Medicine

## 2022-02-17 ENCOUNTER — Encounter: Payer: Self-pay | Admitting: Internal Medicine

## 2022-02-17 DIAGNOSIS — C3490 Malignant neoplasm of unspecified part of unspecified bronchus or lung: Secondary | ICD-10-CM

## 2022-02-17 DIAGNOSIS — R569 Unspecified convulsions: Secondary | ICD-10-CM

## 2022-02-17 DIAGNOSIS — C7931 Secondary malignant neoplasm of brain: Secondary | ICD-10-CM | POA: Diagnosis not present

## 2022-02-17 LAB — URINE CULTURE
MICRO NUMBER:: 13800095
SPECIMEN QUALITY:: ADEQUATE

## 2022-02-17 MED ORDER — LEVETIRACETAM 100 MG/ML PO SOLN: 1000.0000 mg | Freq: Two times a day (BID) | ORAL | 0 refills | Status: DC

## 2022-02-17 NOTE — Progress Notes (Signed)
  Radiation Oncology         [phone] ________________________________  Name: Hannah [name] MRN: [id number]  Date: 11/18/2021  DOB: Jul 14, 1950  End of Treatment Note  Diagnosis:    71 yo woman with a solitary recurrent brain metastasis s/p previous SRS and salvage LITT from adenocarcinoma of the right upper lung        Indication for treatment:  Palliative       Radiation treatment dates:   5/11-5/22/23  Site/dose/beams/energy:   Left parietal 26 mm target was treated using 5 Rapid Arc VMAT Beams using a fractional prescription dose of 5.5 Gy repeated to a total of 5 fractions for a cumulative prescription dose of 27.5 Gy.  ExacTrac registration was performed for each couch angle.  The 100% isodose line was prescribed.  6 MV X-rays were delivered in the flattening filter free beam mode.  Narrative: The patient tolerated radiation treatment relatively well.     Plan: The patient has completed radiation treatment. The patient will return to radiation oncology clinic for routine followup in one month. I advised her to call or return sooner if she has any questions or concerns related to her recovery or treatment. ________________________________  Sheral Apley. Tammi Klippel, M.D.

## 2022-02-17 NOTE — Progress Notes (Signed)
I connected with Hannah [name] on 02/17/22 at  9:30 AM EDT by telephone visit and verified that I am speaking with the correct person using two identifiers.  I discussed the limitations, risks, security and privacy concerns of performing an evaluation and management service by telemedicine and the availability of in-person appointments. I also discussed with the patient that there may be a patient responsible charge related to this service. The patient expressed understanding and agreed to proceed.  Other persons participating in the visit and their role in the encounter:  daughter  Patient's location:  Home  Provider's location:  Office  Chief Complaint:  Primary malignant neoplasm of lung with metastasis to brain [facility])  Seizure ([place])  History of Present Ilness: Hannah [name] is feeling well, no change in symptom burden.  She does remain very fatigued as prior, no further seizures.  Currently on Keppra 1500mg  and Vimpat 100mg  both twice per day.  Observations: Language and cognition at baseline  Assessment and Plan: Primary malignant neoplasm of lung with metastasis to brain [facility])  Seizure ([place])  Recommended decreasing Keppra to 1000mg  BID given stability, fatigue concerns.  Will con't vimpat 100mg  BID for now.    MRI brain from [place] was reviewed, is stable.   Follow Up Instructions: RTC following MRI brain in October.  I discussed the assessment and treatment plan with the patient.  The patient was provided an opportunity to ask questions and all were answered.  The patient agreed with the plan and demonstrated understanding of the instructions.    The patient was advised to call back or seek an in-person evaluation if the symptoms worsen or if the condition fails to improve as anticipated.  I provided 5-10 minutes of non-face-to-face time during this enocunter.  Ventura Sellers, MD   I provided 22 minutes of non face-to-face telephone visit time during this encounter, and > 50%  was spent counseling as documented under my assessment & plan.

## 2022-02-18 NOTE — Addendum Note (Signed)
Addended by: Ailene Ards on: 02/18/2022 08:20 AM   Modules accepted: Level of Service

## 2022-02-21 ENCOUNTER — Other Ambulatory Visit: Payer: Self-pay | Admitting: Internal Medicine

## 2022-02-24 ENCOUNTER — Other Ambulatory Visit (HOSPITAL_COMMUNITY): Payer: Self-pay | Admitting: Internal Medicine

## 2022-02-24 DIAGNOSIS — I739 Peripheral vascular disease, unspecified: Secondary | ICD-10-CM

## 2022-02-25 ENCOUNTER — Ambulatory Visit
Admission: RE | Admit: 2022-02-25 | Discharge: 2022-02-25 | Disposition: A | Discharge status: Home / Self Care | Payer: Medicare HMO | Source: Ambulatory Visit | Attending: Internal Medicine | Admitting: Internal Medicine

## 2022-02-25 DIAGNOSIS — I700 Atherosclerosis of aorta: Secondary | ICD-10-CM | POA: Diagnosis not present

## 2022-02-25 DIAGNOSIS — J90 Pleural effusion, not elsewhere classified: Secondary | ICD-10-CM | POA: Diagnosis not present

## 2022-02-25 DIAGNOSIS — C3490 Malignant neoplasm of unspecified part of unspecified bronchus or lung: Secondary | ICD-10-CM | POA: Diagnosis not present

## 2022-02-25 DIAGNOSIS — D179 Benign lipomatous neoplasm, unspecified: Secondary | ICD-10-CM | POA: Diagnosis not present

## 2022-02-25 DIAGNOSIS — R918 Other nonspecific abnormal finding of lung field: Secondary | ICD-10-CM | POA: Diagnosis not present

## 2022-02-25 DIAGNOSIS — K8020 Calculus of gallbladder without cholecystitis without obstruction: Secondary | ICD-10-CM | POA: Diagnosis not present

## 2022-02-25 MED ORDER — IOPAMIDOL (ISOVUE-300) INJECTION 61%
100.0000 mL | Freq: Once | INTRAVENOUS | Status: AC | PRN
  Administered 2022-02-25: 100 mL via INTRAVENOUS

## 2022-02-27 ENCOUNTER — Other Ambulatory Visit: Payer: Medicare HMO

## 2022-02-27 ENCOUNTER — Other Ambulatory Visit: Payer: Self-pay

## 2022-02-27 ENCOUNTER — Inpatient Hospital Stay: Payer: Medicare HMO

## 2022-02-27 ENCOUNTER — Inpatient Hospital Stay (HOSPITAL_BASED_OUTPATIENT_CLINIC_OR_DEPARTMENT_OTHER): Payer: Medicare HMO | Admitting: Internal Medicine

## 2022-02-27 ENCOUNTER — Ambulatory Visit: Payer: Medicare HMO | Admitting: Physician Assistant

## 2022-02-27 ENCOUNTER — Ambulatory Visit: Payer: Medicare HMO

## 2022-02-27 DIAGNOSIS — F32A Depression, unspecified: Secondary | ICD-10-CM | POA: Diagnosis not present

## 2022-02-27 DIAGNOSIS — F419 Anxiety disorder, unspecified: Secondary | ICD-10-CM | POA: Diagnosis not present

## 2022-02-27 DIAGNOSIS — C3411 Malignant neoplasm of upper lobe, right bronchus or lung: Secondary | ICD-10-CM

## 2022-02-27 DIAGNOSIS — E785 Hyperlipidemia, unspecified: Secondary | ICD-10-CM | POA: Diagnosis not present

## 2022-02-27 DIAGNOSIS — C7931 Secondary malignant neoplasm of brain: Secondary | ICD-10-CM | POA: Diagnosis not present

## 2022-02-27 DIAGNOSIS — Z5112 Encounter for antineoplastic immunotherapy: Secondary | ICD-10-CM | POA: Diagnosis not present

## 2022-02-27 DIAGNOSIS — K219 Gastro-esophageal reflux disease without esophagitis: Secondary | ICD-10-CM | POA: Diagnosis not present

## 2022-02-27 DIAGNOSIS — R350 Frequency of micturition: Secondary | ICD-10-CM | POA: Diagnosis not present

## 2022-02-27 DIAGNOSIS — Z5111 Encounter for antineoplastic chemotherapy: Secondary | ICD-10-CM | POA: Diagnosis not present

## 2022-02-27 LAB — CBC WITH DIFFERENTIAL (CANCER CENTER ONLY)
Abs Immature Granulocytes: 0.02 10*3/uL (ref 0.00–0.07)
Basophils Absolute: 0 10*3/uL (ref 0.0–0.1)
Basophils Relative: 1 %
Eosinophils Absolute: 0 10*3/uL (ref 0.0–0.5)
Eosinophils Relative: 0 %
HCT: 36.8 % (ref 36.0–46.0)
Hemoglobin: 11.9 g/dL — ABNORMAL LOW (ref 12.0–15.0)
Immature Granulocytes: 1 %
Lymphocytes Relative: 32 %
Lymphs Abs: 1.4 10*3/uL (ref 0.7–4.0)
MCH: 29 pg (ref 26.0–34.0)
MCHC: 32.3 g/dL (ref 30.0–36.0)
MCV: 89.5 fL (ref 80.0–100.0)
Monocytes Absolute: 0.5 10*3/uL (ref 0.1–1.0)
Monocytes Relative: 11 %
Neutro Abs: 2.3 10*3/uL (ref 1.7–7.7)
Neutrophils Relative %: 55 %
Platelet Count: 310 10*3/uL (ref 150–400)
RBC: 4.11 MIL/uL (ref 3.87–5.11)
RDW: 13.2 % (ref 11.5–15.5)
WBC Count: 4.2 10*3/uL (ref 4.0–10.5)
nRBC: 0 % (ref 0.0–0.2)

## 2022-02-27 LAB — CMP (CANCER CENTER ONLY)
ALT: 21 U/L (ref 0–44)
AST: 30 U/L (ref 15–41)
Albumin: 4.1 g/dL (ref 3.5–5.0)
Alkaline Phosphatase: 70 U/L (ref 38–126)
Anion gap: 4 — ABNORMAL LOW (ref 5–15)
BUN: 9 mg/dL (ref 8–23)
CO2: 30 mmol/L (ref 22–32)
Calcium: 9.9 mg/dL (ref 8.9–10.3)
Chloride: 108 mmol/L (ref 98–111)
Creatinine: 0.73 mg/dL (ref 0.44–1.00)
GFR, Estimated: >60 mL/min (ref >60)
Glucose, Bld: 126 mg/dL — ABNORMAL HIGH (ref 70–99)
Potassium: 4.1 mmol/L (ref 3.5–5.1)
Sodium: 142 mmol/L (ref 135–145)
Total Bilirubin: 0.2 mg/dL — ABNORMAL LOW (ref 0.3–1.2)
Total Protein: 7 g/dL (ref 6.5–8.1)

## 2022-02-27 LAB — TSH: TSH: 0.603 u[IU]/mL (ref 0.350–4.500)

## 2022-02-27 MED ORDER — SODIUM CHLORIDE 0.9 % IV SOLN
500.0000 mg/m2 | Freq: Once | INTRAVENOUS | Status: AC
  Administered 2022-02-27: 800 mg via INTRAVENOUS
  Filled 2022-02-27: qty 20

## 2022-02-27 MED ORDER — SODIUM CHLORIDE 0.9 % IV SOLN
200.0000 mg | Freq: Once | INTRAVENOUS | Status: AC
  Administered 2022-02-27: 200 mg via INTRAVENOUS
  Filled 2022-02-27: qty 200

## 2022-02-27 MED ORDER — PROCHLORPERAZINE MALEATE 10 MG PO TABS
10.0000 mg | ORAL_TABLET | Freq: Once | ORAL | Status: AC
  Administered 2022-02-27: 10 mg via ORAL
  Filled 2022-02-27: qty 1

## 2022-02-27 MED ORDER — SODIUM CHLORIDE 0.9 % IV SOLN: Freq: Once | INTRAVENOUS | Status: AC

## 2022-02-27 NOTE — Addendum Note (Signed)
Addended by: Ardeen Garland on: 02/27/2022 11:35 AM   Modules accepted: Orders

## 2022-02-27 NOTE — Patient Instructions (Signed)
[facility] ONCOLOGY  Discharge Instructions: Thank you for choosing [facility] to provide your oncology and hematology care.   If you have a lab appointment with the [facility], please go directly to the [facility] and check in at the registration area.   Wear comfortable clothing and clothing appropriate for easy access to any Portacath or PICC line.   We strive to give you quality time with your provider. You may need to reschedule your appointment if you arrive late (15 or more minutes).  Arriving late affects you and other patients whose appointments are after yours.  Also, if you miss three or more appointments without notifying the office, you may be dismissed from the clinic at the provider's discretion.      For prescription refill requests, have your pharmacy contact our office and allow 72 hours for refills to be completed.    Today you received the following chemotherapy and/or immunotherapy agents keytruda alimta      To help prevent nausea and vomiting after your treatment, we encourage you to take your nausea medication as directed.  BELOW ARE SYMPTOMS THAT SHOULD BE REPORTED IMMEDIATELY: *FEVER GREATER THAN 100.4 F (38 C) OR HIGHER *CHILLS OR SWEATING *NAUSEA AND VOMITING THAT IS NOT CONTROLLED WITH YOUR NAUSEA MEDICATION *UNUSUAL SHORTNESS OF BREATH *UNUSUAL BRUISING OR BLEEDING *URINARY PROBLEMS (pain or burning when urinating, or frequent urination) *BOWEL PROBLEMS (unusual diarrhea, constipation, pain near the anus) TENDERNESS IN MOUTH AND THROAT WITH OR WITHOUT PRESENCE OF ULCERS (sore throat, sores in mouth, or a toothache) UNUSUAL RASH, SWELLING OR PAIN  UNUSUAL VAGINAL DISCHARGE OR ITCHING   Items with * indicate a potential emergency and should be followed up as soon as possible or go to the Emergency Department if any problems should occur.  Please show the CHEMOTHERAPY ALERT CARD or IMMUNOTHERAPY ALERT CARD at  check-in to the Emergency Department and triage nurse.  Should you have questions after your visit or need to cancel or reschedule your appointment, please contact [facility]  Dept: [phone]  and follow the prompts.  Office hours are 8:00 a.m. to 4:30 p.m. Monday - Friday. Please note that voicemails left after 4:00 p.m. may not be returned until the following business day.  We are closed weekends and major holidays. You have access to a nurse at all times for urgent questions. Please call the main number to the clinic Dept: [phone] and follow the prompts.   For any non-urgent questions, you may also contact your provider using MyChart. We now offer e-Visits for anyone 17 and older to request care online for non-urgent symptoms. For details visit [email or web address].   Also download the MyChart app! Go to the app store, search "MyChart", open the app, select [facility], and log in with your MyChart username and password.  Masks are optional in the cancer centers. If you would like for your care team to wear a mask while they are taking care of you, please let them know. You may have one support person who is at least 73 years old accompany you for your appointments.

## 2022-02-27 NOTE — Progress Notes (Signed)
[facility] [phone]   [phone]  OFFICE PROGRESS NOTE  [name], MD [place] [address]  DIAGNOSIS: Stage IV non-small cell lung cancer (T, N0, M1C) adenocarcinoma.  The patient presented with a and solitary brain metastasis.  The patient was diagnosed in February 2022.  Biomarker Findings Microsatellite status - MS-Stable Tumor Mutational Burden - 8 Muts/Mb Genomic Findings For a complete list of the genes assayed, please refer to the Appendix. KRAS G12V KEAP1 S224F TP53 P151T 7 Disease relevant genes with no reportable alterations: ALK, BRAF, EGFR, ERBB2, MET, RET, ROS1  PDL1 Expression: 50%    PRIOR THERAPY: 1) SRS to the metastatic brain lesion on 08/23/2020 under the care of Dr. Tammi Klippel and craniotomy under the care of Dr. Zada Finders scheduled for 08/24/2020. 2) S/p robotic assisted right upper lobectomy with en bloc wedge resection of the right middle lobe and lymph node dissection under the care of Dr. Roxan Hockey on September 24, 2020. 3) status post SRS to a new subcentimeter brain lesions under the care of Dr. Lisbeth Renshaw. 4) SRS to brain metastasis.   CURRENT THERAPY: Systemic chemotherapy with carboplatin for AUC of 5, Alimta 500 Mg/M2 and Keytruda 200 mg IV every 3 weeks.  First dose Nov 07, 2020.  Status post 21 cycles.  Starting from cycle #5 the patient will be on maintenance treatment with Alimta and Keytruda every 3 weeks.   INTERVAL HISTORY: Hannah [name] 71 y.o. female returns to the clinic today for follow-up visit accompanied by a family member.  The patient is feeling fine today with no concerning complaints except for fatigue.  She lost 2 pounds since her last visit.  She denied having any current chest pain, shortness of breath, cough or hemoptysis.  She has no nausea, vomiting, diarrhea or constipation.  She has no headache or visual changes.  She denied having any fever or chills.  She continues  to tolerate her treatment with maintenance Alimta and Keytruda fairly well.  The patient had repeat CT scan of the chest, abdomen and pelvis performed recently and she is here for evaluation and discussion of her scan results.  MEDICAL HISTORY: Past Medical History:  Diagnosis Date   Allergy    Anemia    Anxiety    Arthritis    Carpal tunnel syndrome    Constipation    senna C stool softeners help    Depression    Diverticulosis    Dyslipidemia    External hemorrhoids    GERD (gastroesophageal reflux disease)    Heart murmur    mild-moderate AR   Hiatal hernia    Hyperlipidemia    on meds    Hypertension    Internal hemorrhoids    lung ca with brain mets 07/2020   Osteoarthritis    Pre-diabetes    PVD (peripheral vascular disease) (HCC)    moderate carotid disease   RBBB    Smoker    Vocal cord polyps     ALLERGIES:  is allergic to amlodipine, chantix [varenicline tartrate], clarithromycin, lisinopril, simvastatin, wellbutrin [bupropion hcl], lipitor [atorvastatin], and sertraline.  MEDICATIONS:  Current Outpatient Medications  Medication Sig Dispense Refill   acetaminophen (TYLENOL) 325 MG tablet Take 325 mg by mouth every 6 (six) hours as needed for moderate pain.     ALPRAZolam (XANAX XR) 0.5 MG 24 hr tablet Take 1 tablet (0.5 mg total) by mouth daily. 30 tablet 5   ALPRAZolam Duanne Moron)  0.5 MG tablet TAKE 1 TABLET BY MOUTH THREE TIMES DAILY AS NEEDED FOR ANXIETY 90 tablet 0   Ascorbic Acid (VITAMIN C) 1000 MG tablet Take 1,000 mg by mouth daily.     aspirin 81 MG tablet Take 1 tablet (81 mg total) by mouth daily. Restart on 08/31/20 30 tablet    bisacodyl (DULCOLAX) 5 MG EC tablet Take 5 mg by mouth daily as needed for moderate constipation.     carboxymethylcellulose (REFRESH PLUS) 0.5 % SOLN Place 1-2 drops into both eyes 3 (three) times daily as needed (dry eyes).     cephALEXin (KEFLEX) 500 MG capsule Take 1 capsule (500 mg total) by mouth 4 (four) times daily. 20  capsule 0   Cholecalciferol (D3 PO) Take 1 capsule by mouth daily.     citalopram (CELEXA) 10 MG tablet Take 1 tablet (10 mg total) by mouth daily. 90 tablet 1   fluticasone (FLONASE) 50 MCG/ACT nasal spray PLACE 2 SPRAYS INTO BOTH NOSTRILS DAILY. (Patient taking differently: Place 2 sprays into both nostrils daily as needed for allergies.) 48 g 1   folic acid (FOLVITE) 1 MG tablet Take 1 tablet (1 mg total) by mouth daily. 30 tablet 3   Lacosamide 100 MG TABS Take 1 tablet (100 mg total) by mouth 2 (two) times daily. 60 tablet 3   levETIRAcetam (KEPPRA) 100 MG/ML solution TAKE 15 ML BY MOUTH TWICE DAILY 473 mL 0   loratadine (CLARITIN) 10 MG tablet Take 10 mg by mouth daily as needed for allergies.     magnesium hydroxide (MILK OF MAGNESIA) 800 MG/5ML suspension Take 30 mLs by mouth daily as needed for constipation.     Menthol, Topical Analgesic, (BIOFREEZE EX) Apply 1 application topically as needed (pain).     olopatadine (PATANOL) 0.1 % ophthalmic solution Place 1 drop into both eyes 2 (two) times daily as needed for allergies.     omeprazole (PRILOSEC) 40 MG capsule TAKE 1 CAPSULE TWICE DAILY BEFORE MEALS (Patient taking differently: Take 40 mg by mouth daily.) 180 capsule 0   oxyCODONE (OXY IR/ROXICODONE) 5 MG immediate release tablet Take 5 mg by mouth every 8 (eight) hours as needed.     polyethylene glycol (MIRALAX / GLYCOLAX) 17 g packet Take 17 g by mouth 2 (two) times daily. 72 each 0   pravastatin (PRAVACHOL) 40 MG tablet TAKE 1 TABLET (40 MG TOTAL) BY MOUTH EVERY EVENING. (Patient taking differently: Take 40 mg by mouth daily.) 90 tablet 0   Probiotic Product (PROBIOTIC DAILY PO) Take 1 capsule by mouth daily.     prochlorperazine (COMPAZINE) 10 MG tablet TAKE 1 TABLET BY MOUTH EVERY 6 HOURS AS NEEDED FOR NAUSEA OR VOMITING 30 tablet 0   simethicone (MYLICON) 80 MG chewable tablet Chew 1 tablet (80 mg total) by mouth 4 (four) times daily as needed for flatulence (Bloating). 30 tablet  0   No current facility-administered medications for this visit.    SURGICAL HISTORY:  Past Surgical History:  Procedure Laterality Date   ABDOMINAL HYSTERECTOMY  5465   APPLICATION OF CRANIAL NAVIGATION N/A 08/24/2020   Procedure: APPLICATION OF CRANIAL NAVIGATION;  Surgeon: Judith Part, MD;  Location: [facility];  Service: Neurosurgery;  Laterality: N/A;   COLONOSCOPY     COLONOSCOPY W/ POLYPECTOMY  2009   CRANIOTOMY Left 08/24/2020   Procedure: Left Craniotomy for Tumor Resection with Brainlab;  Surgeon: Judith Part, MD;  Location: [facility];  Service: Neurosurgery;  Laterality: Left;   HEMORRHOID  SURGERY     INTERCOSTAL NERVE BLOCK Right 09/24/2020   Procedure: INTERCOSTAL NERVE BLOCK;  Surgeon: [place]rose Nakayama, MD;  Location: [facility];  Service: Thoracic;  Laterality: Right;   JOINT REPLACEMENT     LYMPH NODE DISSECTION Right 09/24/2020   Procedure: LYMPH NODE DISSECTION;  Surgeon: [place] Nakayama, MD;  Location: [facility];  Service: Thoracic;  Laterality: Right;   POLYPECTOMY     right total knee arthroplasty     Dr. Emeterio Reeve 06-04-18   [place]  08/16/2009   TOTAL KNEE ARTHROPLASTY Left 02/13/2014   Procedure: TOTAL KNEE ARTHROPLASTY;  Surgeon: Alta Corning, MD;  Location: [facility];  Service: Orthopedics;  Laterality: Left;   TOTAL KNEE ARTHROPLASTY Right 06/04/2018   Procedure: RIGHT TOTAL KNEE ARTHROPLASTY;  Surgeon: Dorna Leitz, MD;  Location: [facility];  Service: Orthopedics;  Laterality: Right;  Adductor Block   TUBAL LIGATION      REVIEW OF SYSTEMS:  Constitutional: positive for fatigue and weight loss Eyes: negative Ears, nose, mouth, throat, and face: negative Respiratory: negative Cardiovascular: negative Gastrointestinal: negative Genitourinary:negative Integument/breast: negative Hematologic/lymphatic: negative Musculoskeletal:negative Neurological: negative Behavioral/Psych: negative Endocrine: negative Allergic/Immunologic: negative   PHYSICAL  EXAMINATION: General appearance: alert, cooperative, fatigued, and no distress Head: Normocephalic, without obvious abnormality, atraumatic Neck: no adenopathy, no JVD, supple, symmetrical, trachea midline, and thyroid not enlarged, symmetric, no tenderness/mass/nodules Lymph nodes: Cervical, supraclavicular, and axillary nodes normal. Resp: clear to auscultation bilaterally Back: symmetric, no curvature. ROM normal. No CVA tenderness. Cardio: regular rate and rhythm, S1, S2 normal, no murmur, click, rub or gallop GI: soft, non-tender; bowel sounds normal; no masses,  no organomegaly Extremities: extremities normal, atraumatic, no cyanosis or edema Neurologic: Alert and oriented X 3, normal strength and tone. Normal symmetric reflexes. Normal coordination and gait  ECOG PERFORMANCE STATUS: 1 - Symptomatic but completely ambulatory  Blood pressure (!) 157/81, pulse 64, temperature 98 F (36.7 C), temperature source Oral, resp. rate 15, weight 128 lb 9.6 oz (58.3 kg), SpO2 99 %.  LABORATORY DATA: Lab Results  Component Value Date   WBC 4.2 02/27/2022   HGB 11.9 (L) 02/27/2022   HCT 36.8 02/27/2022   MCV 89.5 02/27/2022   PLT 310 02/27/2022      Chemistry      Component Value Date/Time   NA 142 02/27/2022 0845   K 4.1 02/27/2022 0845   CL 108 02/27/2022 0845   CO2 30 02/27/2022 0845   BUN 9 02/27/2022 0845   CREATININE 0.73 02/27/2022 0845   CREATININE 0.79 03/14/2020 1016      Component Value Date/Time   CALCIUM 9.9 02/27/2022 0845   ALKPHOS 70 02/27/2022 0845   AST 30 02/27/2022 0845   ALT 21 02/27/2022 0845   BILITOT 0.2 (L) 02/27/2022 0845       RADIOGRAPHIC STUDIES: CT Abdomen Pelvis W Contrast  Result Date: 02/26/2022 CLINICAL DATA:  Non-small cell lung cancer restaging * Tracking Code: BO * EXAM: CT CHEST, ABDOMEN, AND PELVIS WITH CONTRAST TECHNIQUE: Multidetector CT imaging of the chest, abdomen and pelvis was performed following the standard protocol during  bolus administration of intravenous contrast. RADIATION DOSE REDUCTION: This exam was performed according to the departmental dose-optimization program which includes automated exposure control, adjustment of the mA and/or kV according to patient size and/or use of iterative reconstruction technique. CONTRAST:  131m ISOVUE-300 IOPAMIDOL (ISOVUE-300) INJECTION 61%, additional oral enteric contrast COMPARISON:  CT chest abdomen pelvis, 12/24/2021 FINDINGS: CT CHEST FINDINGS Cardiovascular: Aortic atherosclerosis. Normal heart size.  Left coronary artery calcifications. No pericardial effusion. Mediastinum/Nodes: No enlarged mediastinal, hilar, or axillary lymph nodes. Thyroid gland, trachea, and esophagus demonstrate no significant findings. Lungs/Pleura: Status post right upper lobectomy and additional wedge resection of the right middle lobe. Unchanged, trace right pleural effusion. Tiny left pulmonary nodules are unchanged, including a 0.2 cm nodule of the peripheral left upper lobe (series 4, image 81) and a 0.3 cm nodule of the superior segment left lower lobe (series 4, image 77). No new nodules. Musculoskeletal: No chest wall abnormality. No acute osseous findings. CT ABDOMEN PELVIS FINDINGS Hepatobiliary: No solid liver abnormality is seen. Faintly rim calcified gallstones. No gallbladder wall thickening, or biliary dilatation. Pancreas: Unremarkable. No pancreatic ductal dilatation or surrounding inflammatory changes. Spleen: Normal in size without significant abnormality. Adrenals/Urinary Tract: Adrenal glands are unremarkable. Kidneys are normal, without renal calculi, solid lesion, or hydronephrosis. Tiny focus of air in the urinary bladder. Stomach/Bowel: Stomach is within normal limits. Appendix appears normal. No evidence of bowel wall thickening, distention, or inflammatory changes. Occasional sigmoid diverticula. Vascular/Lymphatic: Aortic atherosclerosis. No enlarged abdominal or pelvic lymph nodes.  Reproductive: Status post hysterectomy. Other: No abdominal wall hernia or abnormality. No ascites. Musculoskeletal: No acute osseous findings. Unchanged, benign lipoma near the insertion of the iliacus on the lesser trochanter (series 2, image 108). IMPRESSION: 1. Status post right upper lobectomy and additional wedge resection of the right middle lobe. Unchanged, trace right pleural effusion. 2. Tiny left pulmonary nodules are unchanged, almost certainly benign and incidental. No new nodules. Attention on follow-up. 3. No evidence of lymphadenopathy or metastatic disease in the chest, abdomen or pelvis. 4. Cholelithiasis. 5. Tiny focus of air in the urinary bladder, likely due to recent catheterization. In the absence of catheterization, an alternate explanation for air within the urinary tract is bowel or other fistula, however there are no adjacent inflammatory findings of the bowel or other imaging findings to suggest fistula at this time. 6. Coronary artery disease. Aortic Atherosclerosis (ICD10-I70.0). Electronically Signed   By: Delanna Ahmadi M.D.   On: 02/26/2022 08:44   CT Chest W Contrast  Result Date: 02/26/2022 CLINICAL DATA:  Non-small cell lung cancer restaging * Tracking Code: BO * EXAM: CT CHEST, ABDOMEN, AND PELVIS WITH CONTRAST TECHNIQUE: Multidetector CT imaging of the chest, abdomen and pelvis was performed following the standard protocol during bolus administration of intravenous contrast. RADIATION DOSE REDUCTION: This exam was performed according to the departmental dose-optimization program which includes automated exposure control, adjustment of the mA and/or kV according to patient size and/or use of iterative reconstruction technique. CONTRAST:  169m ISOVUE-300 IOPAMIDOL (ISOVUE-300) INJECTION 61%, additional oral enteric contrast COMPARISON:  CT chest abdomen pelvis, 12/24/2021 FINDINGS: CT CHEST FINDINGS Cardiovascular: Aortic atherosclerosis. Normal heart size. Left coronary  artery calcifications. No pericardial effusion. Mediastinum/Nodes: No enlarged mediastinal, hilar, or axillary lymph nodes. Thyroid gland, trachea, and esophagus demonstrate no significant findings. Lungs/Pleura: Status post right upper lobectomy and additional wedge resection of the right middle lobe. Unchanged, trace right pleural effusion. Tiny left pulmonary nodules are unchanged, including a 0.2 cm nodule of the peripheral left upper lobe (series 4, image 81) and a 0.3 cm nodule of the superior segment left lower lobe (series 4, image 77). No new nodules. Musculoskeletal: No chest wall abnormality. No acute osseous findings. CT ABDOMEN PELVIS FINDINGS Hepatobiliary: No solid liver abnormality is seen. Faintly rim calcified gallstones. No gallbladder wall thickening, or biliary dilatation. Pancreas: Unremarkable. No pancreatic ductal dilatation or surrounding inflammatory changes. Spleen: Normal in  size without significant abnormality. Adrenals/Urinary Tract: Adrenal glands are unremarkable. Kidneys are normal, without renal calculi, solid lesion, or hydronephrosis. Tiny focus of air in the urinary bladder. Stomach/Bowel: Stomach is within normal limits. Appendix appears normal. No evidence of bowel wall thickening, distention, or inflammatory changes. Occasional sigmoid diverticula. Vascular/Lymphatic: Aortic atherosclerosis. No enlarged abdominal or pelvic lymph nodes. Reproductive: Status post hysterectomy. Other: No abdominal wall hernia or abnormality. No ascites. Musculoskeletal: No acute osseous findings. Unchanged, benign lipoma near the insertion of the iliacus on the lesser trochanter (series 2, image 108). IMPRESSION: 1. Status post right upper lobectomy and additional wedge resection of the right middle lobe. Unchanged, trace right pleural effusion. 2. Tiny left pulmonary nodules are unchanged, almost certainly benign and incidental. No new nodules. Attention on follow-up. 3. No evidence of  lymphadenopathy or metastatic disease in the chest, abdomen or pelvis. 4. Cholelithiasis. 5. Tiny focus of air in the urinary bladder, likely due to recent catheterization. In the absence of catheterization, an alternate explanation for air within the urinary tract is bowel or other fistula, however there are no adjacent inflammatory findings of the bowel or other imaging findings to suggest fistula at this time. 6. Coronary artery disease. Aortic Atherosclerosis (ICD10-I70.0). Electronically Signed   By: Delanna Ahmadi M.D.   On: 02/26/2022 08:44     ASSESSMENT AND PLAN: This is a very pleasant 71 years old African-American female diagnosed with a stage IV (T3, N0, M1b) non-small cell lung cancer, adenocarcinoma presented with right upper lobe lung mass with solitary brain metastasis diagnosed in February 2022 status post SRS to the brain lesion followed by craniotomy and resection. The patient also has a solitary lung mass. S/p robotic assisted right upper lobectomy with en bloc wedge resection of the right middle lobe and lymph node dissection under the care of Dr. Roxan Hockey on September 24, 2020. She also underwent SRS treatment to a new subcentimeter brain lesions under the care of Dr. Lisbeth Renshaw She is currently undergoing systemic chemotherapy with carboplatin for AUC of 5, Alimta 500 Mg/M2 and Keytruda 200 Mg IV every 3 weeks status post 21 cycles.  Starting from cycle #5 the patient will be on maintenance treatment with Alimta and Keytruda every 3 weeks. The patient has been tolerating her treatment fairly well with no concerning complaints except for mild fatigue. She had repeat CT scan of the chest, abdomen and pelvis performed recently.  I personally and independently reviewed the scan and discussed the result with the patient and her family member. Her scan showed no concerning findings for disease progression. I recommended for her to continue her current treatment with maintenance Alimta and  Keytruda and she will proceed with cycle #22 today. For the brain metastasis, she is followed by radiation oncology and neuro-oncology. The patient will come back for follow-up visit in 3 weeks for evaluation before the next cycle of her treatment. She was advised to call immediately if she has any other concerning symptoms in the interval. The patient voices understanding of current disease status and treatment options and is in agreement with the current care plan.  All questions were answered. The patient knows to call the clinic with any problems, questions or concerns. We can certainly see the patient much sooner if necessary.  Disclaimer: This note was dictated with voice recognition software. Similar sounding words can inadvertently be transcribed and may not be corrected upon review.

## 2022-02-28 ENCOUNTER — Other Ambulatory Visit: Payer: Self-pay

## 2022-03-05 ENCOUNTER — Ambulatory Visit: Payer: Medicare HMO | Admitting: Family Medicine

## 2022-03-05 NOTE — Progress Notes (Deleted)
   I, Peterson Lombard, LAT, ATC acting as a scribe for Lynne Leader, MD.  Hannah [name] is a 71 y.o. female who presents to [place] at [facility] today for cont'd chronic bilat shoulder pain.  She has a hx of lung cancer that metatastisized to her brain. Pt was last seen by Dr. Georgina Snell on 12/25/21 and was given bilat subacromial steroid injections. Pt had [place] steroid injections bilaterally on 12/11/21, w/o much benefit. Today, pt reports  Dx imaging: 12/11/21 R & L shoulder XR  Pertinent review of systems: ***  Relevant historical information: ***   Exam:  There were no vitals taken for this visit. General: Well Developed, well nourished, and in no acute distress.   MSK: ***    Lab and Radiology Results No results found for this or any previous visit (from the past 72 hour(s)). No results found.     Assessment and Plan: 71 y.o. female with ***   PDMP not reviewed this encounter. No orders of the defined types were placed in this encounter.  No orders of the defined types were placed in this encounter.    Discussed warning signs or symptoms. Please see discharge instructions. Patient expresses understanding.   ***

## 2022-03-06 ENCOUNTER — Encounter: Payer: Self-pay | Admitting: Emergency Medicine

## 2022-03-06 ENCOUNTER — Ambulatory Visit (INDEPENDENT_AMBULATORY_CARE_PROVIDER_SITE_OTHER): Payer: Medicare HMO | Admitting: Emergency Medicine

## 2022-03-06 VITALS — BP 120/70 | HR 94 | Temp 98.8°F | Ht 61.0 in | Wt 130.0 lb

## 2022-03-06 DIAGNOSIS — B349 Viral infection, unspecified: Secondary | ICD-10-CM | POA: Diagnosis not present

## 2022-03-06 DIAGNOSIS — R6889 Other general symptoms and signs: Secondary | ICD-10-CM

## 2022-03-06 LAB — CBC WITH DIFFERENTIAL/PLATELET
Basophils Absolute: 0 10*3/uL (ref 0.0–0.1)
Basophils Relative: 0.5 % (ref 0.0–3.0)
Eosinophils Absolute: 0 10*3/uL (ref 0.0–0.7)
Eosinophils Relative: 0.3 % (ref 0.0–5.0)
HCT: 34.3 % — ABNORMAL LOW (ref 36.0–46.0)
Hemoglobin: 11 g/dL — ABNORMAL LOW (ref 12.0–15.0)
Lymphocytes Relative: 42.6 % (ref 12.0–46.0)
Lymphs Abs: 1.5 10*3/uL (ref 0.7–4.0)
MCHC: 32.1 g/dL (ref 30.0–36.0)
MCV: 87.9 fl (ref 78.0–100.0)
Monocytes Absolute: 0.5 10*3/uL (ref 0.1–1.0)
Monocytes Relative: 15.1 % — ABNORMAL HIGH (ref 3.0–12.0)
Neutro Abs: 1.5 10*3/uL (ref 1.4–7.7)
Neutrophils Relative %: 41.5 % — ABNORMAL LOW (ref 43.0–77.0)
Platelets: 174 10*3/uL (ref 150.0–400.0)
RBC: 3.9 Mil/uL (ref 3.87–5.11)
RDW: 13.5 % (ref 11.5–15.5)
WBC: 3.6 10*3/uL — ABNORMAL LOW (ref 4.0–10.5)

## 2022-03-06 LAB — COMPREHENSIVE METABOLIC PANEL
ALT: 20 U/L (ref 0–35)
AST: 26 U/L (ref 0–37)
Albumin: 3.7 g/dL (ref 3.5–5.2)
Alkaline Phosphatase: 70 U/L (ref 39–117)
BUN: 11 mg/dL (ref 6–23)
CO2: 30 mEq/L (ref 19–32)
Calcium: 9.9 mg/dL (ref 8.4–10.5)
Chloride: 103 mEq/L (ref 96–112)
Creatinine, Ser: 0.79 mg/dL (ref 0.40–1.20)
GFR: 74.12 mL/min (ref >60.00)
Glucose, Bld: 88 mg/dL (ref 70–99)
Potassium: 4.3 mEq/L (ref 3.5–5.1)
Sodium: 140 mEq/L (ref 135–145)
Total Bilirubin: 0.3 mg/dL (ref 0.2–1.2)
Total Protein: 7.1 g/dL (ref 6.0–8.3)

## 2022-03-06 LAB — POCT INFLUENZA A/B
Influenza A, POC: NEGATIVE
Influenza B, POC: NEGATIVE

## 2022-03-06 LAB — POC COVID19 BINAXNOW: SARS Coronavirus 2 Ag: NEGATIVE

## 2022-03-06 NOTE — Assessment & Plan Note (Addendum)
Clinically stable.  No red flag signs or symptoms. Patient's immunocompromised status noticed. Negative COVID and influenza tests. Afebrile with stable vital signs. No respiratory symptoms. Able to eat and drink.  No nausea or vomiting. Advised to rest and stay well-hydrated. Airborne or emergen-C recommended daily for the next 7 to 10 days. Tylenol for headaches and achiness. ED precautions given. Advised to contact the office if no better or worse during the next several days.

## 2022-03-06 NOTE — Progress Notes (Signed)
Hannah [name] 71 y.o.   Chief Complaint  Patient presents with   Fever   Generalized Body Aches   chest congestion   Fatigue    HISTORY OF PRESENT ILLNESS: Acute problem visit today.  Patient of Dr. Billey Gosling. This is a 71 y.o. female complaining of flulike symptoms that started last Monday, 3 days ago.  Complaining of headache, fever, congestion, achiness and generalized weakness. Able to eat and drink.  Denies nausea or vomiting.  Denies abdominal pain or diarrhea. Denies difficulty breathing.  No coughing.  Fever  Associated symptoms include congestion and headaches. Pertinent negatives include no abdominal pain, chest pain, diarrhea, nausea, rash or vomiting.     Prior to Admission medications   Medication Sig Start Date End Date Taking? Authorizing Provider  acetaminophen (TYLENOL) 325 MG tablet Take 325 mg by mouth every 6 (six) hours as needed for moderate pain.   Yes [provider]  ALPRAZolam (XANAX XR) 0.5 MG 24 hr tablet Take 1 tablet (0.5 mg total) by mouth daily. 01/27/22  Yes Burns, Claudina Lick, MD  ALPRAZolam Duanne Moron) 0.5 MG tablet TAKE 1 TABLET BY MOUTH THREE TIMES DAILY AS NEEDED FOR ANXIETY 01/27/22  Yes Burns, Claudina Lick, MD  Ascorbic Acid (VITAMIN C) 1000 MG tablet Take 1,000 mg by mouth daily.   Yes [provider]  aspirin 81 MG tablet Take 1 tablet (81 mg total) by mouth daily. Restart on 08/31/20 08/25/20  Yes Judith Part, MD  bisacodyl (DULCOLAX) 5 MG EC tablet Take 5 mg by mouth daily as needed for moderate constipation.   Yes [provider]  carboxymethylcellulose (REFRESH PLUS) 0.5 % SOLN Place 1-2 drops into both eyes 3 (three) times daily as needed (dry eyes).   Yes [provider]  cephALEXin (KEFLEX) 500 MG capsule Take 1 capsule (500 mg total) by mouth 4 (four) times daily. 02/14/22  Yes Ailene Ards, NP  Cholecalciferol (D3 PO) Take 1 capsule by mouth daily.   Yes [provider]  citalopram (CELEXA) 10  MG tablet Take 1 tablet (10 mg total) by mouth daily. 10/21/21  Yes Burns, Claudina Lick, MD  fluticasone (FLONASE) 50 MCG/ACT nasal spray PLACE 2 SPRAYS INTO BOTH NOSTRILS DAILY. Patient taking differently: Place 2 sprays into both nostrils daily as needed for allergies. 09/05/21  Yes Burns, Claudina Lick, MD  folic acid (FOLVITE) 1 MG tablet Take 1 tablet (1 mg total) by mouth daily. 12/26/21  Yes Vaslow, Acey Lav, MD  Lacosamide 100 MG TABS Take 1 tablet (100 mg total) by mouth 2 (two) times daily. 12/26/21  Yes Vaslow, Acey Lav, MD  levETIRAcetam (KEPPRA) 100 MG/ML solution TAKE 15 ML BY MOUTH TWICE DAILY 02/21/22  Yes Vaslow, Acey Lav, MD  loratadine (CLARITIN) 10 MG tablet Take 10 mg by mouth daily as needed for allergies.   Yes [provider]  magnesium hydroxide (MILK OF MAGNESIA) 800 MG/5ML suspension Take 30 mLs by mouth daily as needed for constipation.   Yes [provider]  Menthol, Topical Analgesic, (BIOFREEZE EX) Apply 1 application topically as needed (pain).   Yes [provider]  mirtazapine (REMERON) 15 MG tablet Take 7.5 mg by mouth at bedtime.   Yes [provider]  olopatadine (PATANOL) 0.1 % ophthalmic solution Place 1 drop into both eyes 2 (two) times daily as needed for allergies.   Yes [provider]  omeprazole (PRILOSEC) 40 MG capsule TAKE 1 CAPSULE TWICE DAILY BEFORE MEALS Patient taking differently:  Take 40 mg by mouth daily. 09/09/21  Yes Burns, Claudina Lick, MD  polyethylene glycol (MIRALAX / GLYCOLAX) 17 g packet Take 17 g by mouth 2 (two) times daily. 08/19/20  Yes Eugenie Filler, MD  pravastatin (PRAVACHOL) 40 MG tablet TAKE 1 TABLET (40 MG TOTAL) BY MOUTH EVERY EVENING. Patient taking differently: Take 40 mg by mouth daily. 09/09/21  Yes Burns, Claudina Lick, MD  Probiotic Product (PROBIOTIC DAILY PO) Take 1 capsule by mouth daily.   Yes [provider]  prochlorperazine (COMPAZINE) 10 MG tablet TAKE 1 TABLET BY MOUTH EVERY 6 HOURS  AS NEEDED FOR NAUSEA OR VOMITING 11/09/21  Yes Curt Bears, MD  simethicone (MYLICON) 80 MG chewable tablet Chew 1 tablet (80 mg total) by mouth 4 (four) times daily as needed for flatulence (Bloating). 09/28/20  Yes Elgie Collard, PA-C    Allergies  Allergen Reactions   Amlodipine Swelling and Rash    Rash, swelling   Chantix [Varenicline Tartrate] Shortness Of Breath, Swelling and Other (See Comments)    Tongue swell,sob   Clarithromycin Rash   Lisinopril Hives   Simvastatin Hives   Wellbutrin [Bupropion Hcl] Hives   Lipitor [Atorvastatin]     Dizziness per patient   Sertraline     Makes her feel like she is going to kill someone    Patient Active Problem List   Diagnosis Date Noted   Bilateral shoulder pain 12/11/2021   Fatigue 12/11/2021   Metastatic lung cancer (metastasis from lung to other site) [facility]) 09/18/2021   Chest pain 08/30/2021   Urinary frequency 08/30/2021   Vasogenic brain edema ([place]) 04/22/2021   Seizure ([place]) 04/22/2021   Nonrheumatic aortic valve insufficiency 04/18/2021   Sinusitis 02/21/2021   Allergic rhinitis 02/21/2021   Encounter for antineoplastic chemotherapy 10/23/2020   Encounter for antineoplastic immunotherapy 10/23/2020   Anemia 10/19/2020   Sleep difficulties 10/19/2020   S/P robot-assisted surgical procedure 09/24/2020   Aortic atherosclerosis ([place]) 09/12/2020   Status post craniotomy 08/24/2020   Hematochezia 08/19/2020   Rectal bleeding 08/17/2020   Primary cancer of right upper lobe of lung ([place]) 08/07/2020   Primary malignant neoplasm of lung with metastasis to brain ([place]) 08/06/2020   Memory changes 07/24/2020   Non-recurrent unilateral inguinal hernia without obstruction or gangrene 07/24/2020   Pain of right clavicle 09/12/2019   Nonspecific abnormal electrocardiogram (ECG) (EKG) 10/20/2018   Depression 07/17/2018   Primary osteoarthritis of right knee 06/04/2018   Bilateral carotid artery stenosis 09/24/2017   Aortic  valve sclerosis 09/24/2017   Vocal cord polyps 09/07/2017   Dysphagia 09/07/2017   Diabetes mellitus without complication ([place]) 62/37/6283   GERD (gastroesophageal reflux disease) 02/27/2016   Anxiety 02/27/2016   S/P total knee replacement 02/13/2014   RBBB 02/08/2014   PVD - bilateral 60-79% carotid strenosis 02/08/2014   Mixed hyperlipidemia 11/29/2013   Carpal tunnel syndrome, bilateral 11/23/2013   Murmur- mild -mod AR, mild MR 11/10/2013   Constipation 12/16/2012    Past Medical History:  Diagnosis Date   Allergy    Anemia    Anxiety    Arthritis    Carpal tunnel syndrome    Constipation    senna C stool softeners help    Depression    Diverticulosis    Dyslipidemia    External hemorrhoids    GERD (gastroesophageal reflux disease)    Heart murmur    mild-moderate AR   Hiatal hernia    Hyperlipidemia    on meds  Hypertension    Internal hemorrhoids    lung ca with brain mets 07/2020   Osteoarthritis    Pre-diabetes    PVD (peripheral vascular disease) (HCC)    moderate carotid disease   RBBB    Smoker    Vocal cord polyps     Past Surgical History:  Procedure Laterality Date   ABDOMINAL HYSTERECTOMY  5188   APPLICATION OF CRANIAL NAVIGATION N/A 08/24/2020   Procedure: APPLICATION OF CRANIAL NAVIGATION;  Surgeon: Judith Part, MD;  Location: [place];  Service: Neurosurgery;  Laterality: N/A;   COLONOSCOPY     COLONOSCOPY W/ POLYPECTOMY  2009   CRANIOTOMY Left 08/24/2020   Procedure: Left Craniotomy for Tumor Resection with Brainlab;  Surgeon: Judith Part, MD;  Location: [place];  Service: Neurosurgery;  Laterality: Left;   HEMORRHOID SURGERY     INTERCOSTAL NERVE BLOCK Right 09/24/2020   Procedure: INTERCOSTAL NERVE BLOCK;  Surgeon: Melrose Nakayama, MD;  Location: [place];  Service: Thoracic;  Laterality: Right;   JOINT REPLACEMENT     LYMPH NODE DISSECTION Right 09/24/2020   Procedure: LYMPH NODE DISSECTION;  Surgeon: Melrose Nakayama, MD;  Location: [place];  Service: Thoracic;  Laterality: Right;   POLYPECTOMY     right total knee arthroplasty     Dr. Emeterio Reeve 06-04-18   [place]  08/16/2009   TOTAL KNEE ARTHROPLASTY Left 02/13/2014   Procedure: TOTAL KNEE ARTHROPLASTY;  Surgeon: Alta Corning, MD;  Location: [place];  Service: Orthopedics;  Laterality: Left;   TOTAL KNEE ARTHROPLASTY Right 06/04/2018   Procedure: RIGHT TOTAL KNEE ARTHROPLASTY;  Surgeon: Dorna Leitz, MD;  Location: [facility];  Service: Orthopedics;  Laterality: Right;  Adductor Block   TUBAL LIGATION      Social History   Socioeconomic History   Marital status: Married    Spouse name: Not on file   Number of children: 2   Years of education: Not on file   Highest education level: Not on file  Occupational History   Not on file  Tobacco Use   Smoking status: Former    Packs/day: 0.25    Years: 40.00    Total pack years: 10.00    Types: Cigarettes    Quit date: 07/09/2020    Years since quitting: 1.6    Passive exposure: Never   Smokeless tobacco: Never   Tobacco comments:    PACK WILL LAST 3 days  Vaping Use   Vaping Use: Never used  Substance and Sexual Activity   Alcohol use: No   Drug use: No   Sexual activity: Not Currently  Other Topics Concern   Not on file  Social History Narrative   Not on file   Social Determinants of Health   Financial Resource Strain: Low Risk  (10/10/2020)   Overall Financial Resource Strain (CARDIA)    Difficulty of Paying Living Expenses: Not hard at all  Food Insecurity: No Food Insecurity (01/01/2022)   Hunger Vital Sign    Worried About Running Out of Food in the Last Year: Never true    [place]line in the Last Year: Never true  Transportation Needs: No Transportation Needs (01/01/2022)   PRAPARE - Hydrologist (Medical): No    Lack of Transportation (Non-Medical): No  Physical Activity: Sufficiently Active (04/12/2021)   Exercise Vital Sign    Days of Exercise per  Week: 3 days    Minutes of Exercise per Session:  60 min  Stress: No Stress Concern Present (04/12/2021)   [place]    Feeling of Stress : Not at all  Social Connections: Socially Integrated (04/12/2021)   Social Connection and Isolation Panel [NHANES]    Frequency of Communication with Friends and Family: More than three times a week    Frequency of Social Gatherings with Friends and Family: More than three times a week    Attends Religious Services: More than 4 times per year    Active Member of [employer] or Organizations: Yes    Attends Music therapist: More than 4 times per year    Marital Status: Married  Human resources officer Violence: Not At Risk (04/12/2021)   Humiliation, Afraid, Rape, and Kick questionnaire    Fear of Current or Ex-Partner: No    Emotionally Abused: No    Physically Abused: No    Sexually Abused: No    Family History  Problem Relation Age of Onset   Cancer Father    Prostate cancer Father    Diabetes Sister    Cancer Sister    Stroke Maternal Grandfather    Colitis Maternal Aunt    Breast cancer Maternal Aunt    Colon cancer Neg Hx    Colon polyps Neg Hx    Rectal cancer Neg Hx    Stomach cancer Neg Hx    Esophageal cancer Neg Hx      Review of Systems  Constitutional:  Positive for fever and malaise/fatigue.  HENT:  Positive for congestion.   Respiratory:  Negative for shortness of breath.   Cardiovascular:  Negative for chest pain and palpitations.  Gastrointestinal:  Negative for abdominal pain, diarrhea, nausea and vomiting.  Genitourinary: Negative.   Skin: Negative.  Negative for rash.  Neurological:  Positive for weakness and headaches. Negative for dizziness.   Today's Vitals   03/06/22 1042  BP: 120/70  Pulse: 94  Temp: 98.8 F (37.1 C)  TempSrc: Oral  SpO2: 95%  Weight: 130 lb (59 kg)  Height: 5\' 1"  (1.549 m)   Body mass index is 24.56  kg/m.   Physical Exam Vitals reviewed.  Constitutional:      Appearance: Normal appearance.  HENT:     Head: Normocephalic.     Right Ear: Tympanic membrane and ear canal normal.     Mouth/Throat:     Mouth: Mucous membranes are moist.     Pharynx: Oropharynx is clear.  Eyes:     Extraocular Movements: Extraocular movements intact.     Conjunctiva/sclera: Conjunctivae normal.     Pupils: Pupils are equal, round, and reactive to light.  Cardiovascular:     Rate and Rhythm: Normal rate and regular rhythm.     Pulses: Normal pulses.     Heart sounds: Normal heart sounds.  Pulmonary:     Effort: Pulmonary effort is normal.     Breath sounds: Normal breath sounds.  Abdominal:     Palpations: Abdomen is soft.     Tenderness: There is no abdominal tenderness.  Musculoskeletal:     Cervical back: No tenderness.     Right lower leg: No edema.     Left lower leg: No edema.  Lymphadenopathy:     Cervical: No cervical adenopathy.  Skin:    General: Skin is warm and dry.     Capillary Refill: Capillary refill takes less than 2 seconds.  Neurological:     General: No focal deficit present.  Mental Status: She is alert and oriented to person, place, and time.  Psychiatric:        Mood and Affect: Mood normal.        Behavior: Behavior normal.    Results for orders placed or performed in visit on 03/06/22 (from the past 24 hour(s))  POC COVID-19     Status: Normal   Collection Time: 03/06/22 11:20 AM  Result Value Ref Range   SARS Coronavirus 2 Ag Negative Negative  POCT Influenza A/B     Status: Normal   Collection Time: 03/06/22 11:21 AM  Result Value Ref Range   Influenza A, POC Negative Negative   Influenza B, POC Negative Negative     ASSESSMENT & PLAN: A total of 45 minutes was spent with the patient and counseling/coordination of care regarding preparing for this visit, review of available medical records, review of multiple chronic medical problems and their  management, review of all medications, diagnosis of viral illness and management, prognosis, documentation, and need for follow-up if no better or worse during the next several days.  Problem List Items Addressed This Visit       Other   Flu-like symptoms   Relevant Orders   POC COVID-19 (Completed)   POCT Influenza A/B (Completed)   CBC with Differential/Platelet   Comprehensive metabolic panel   Viral illness - Primary    Clinically stable.  No red flag signs or symptoms. Patient's immunocompromised status noticed. Negative COVID and influenza tests. Afebrile with stable vital signs. No respiratory symptoms. Able to eat and drink.  No nausea or vomiting. Advised to rest and stay well-hydrated. Airborne or emergen-C recommended daily for the next 7 to 10 days. Tylenol for headaches and achiness. ED precautions given. Advised to contact the office if no better or worse during the next several days.      Relevant Orders   POC COVID-19 (Completed)   POCT Influenza A/B (Completed)   CBC with Differential/Platelet   Comprehensive metabolic panel   Patient Instructions  Viral Illness, Adult Viruses are tiny germs that can get into a person's body and cause illness. There are many different types of viruses, and they cause many types of illness. Viral illnesses can range from mild to severe. They can affect various parts of the body. Short-term conditions that are caused by a virus include colds and the flu (influenza). Long-term conditions that are caused by a virus include herpes, shingles, and HIV (human immunodeficiency virus) infection. A few viruses have been linked to certain cancers. What are the causes? Many types of viruses can cause illness. Viruses invade cells in your body, multiply, and cause the infected cells to work abnormally or die. When these cells die, they release more of the virus. When this happens, you develop symptoms of the illness, and the virus continues to  spread to other cells. If the virus takes over the function of the cell, it can cause the cell to divide and grow out of control. This happens when a virus causes cancer. Different viruses get into the body in different ways. You can get a virus by: Swallowing food or water that has come in contact with the virus (is contaminated). Breathing in droplets that have been coughed or sneezed into the air by an infected person. Touching a surface that has been contaminated with the virus and then touching your eyes, nose, or mouth. Being bitten by an insect or animal that carries the virus. Having sexual contact with a  person who is infected with the virus. Being exposed to blood or fluids that contain the virus, either through an open cut or during a transfusion. If a virus enters your body, your body's defense system (immune system) will try to fight the virus. You may be at higher risk for a viral illness if your immune system is weak. What are the signs or symptoms? You may have these symptoms, depending on the type of virus and the location of the cells that it invades: Cold and flu viruses: Fever. Headache. Sore throat. Muscle aches. Stuffy nose (nasal congestion). Cough. Digestive system (gastrointestinal) viruses: Fever. Pain in the abdomen. Nausea. Diarrhea. Liver viruses (hepatitis): Loss of appetite. Tiredness. Skin or the white parts of your eyes turning yellow (jaundice). Brain and spinal cord viruses: Fever. Headache. Stiff neck. Nausea and vomiting. Confusion or sleepiness. Skin viruses: Warts. Itching. Rash. Sexually transmitted viruses: Discharge. Swelling. Redness. Rash. How is this diagnosed? This condition may be diagnosed based on one or more of the following: Symptoms. Medical history. Physical exam. Blood test, sample of mucus from your lungs (sputum sample), stool sample, or a swab of body fluids or a skin sore (lesion). How is this treated? Viruses  can be hard to treat because they live within cells. Antibiotic medicines do not treat viruses because these medicines do not get inside cells. Treatment for a viral illness may include: Resting and drinking plenty of fluids. Medicines to relieve symptoms. These can include over-the-counter medicine for pain and fever, medicines for cough or congestion, and medicines to relieve diarrhea. Antiviral medicines. These medicines are available only for certain types of viruses. Some viral illnesses can be prevented with vaccinations. A common example is the flu shot. Follow these instructions at home: Medicines Take over-the-counter and prescription medicines only as told by your health care provider. If you were prescribed an antiviral medicine, take it as told by your health care provider. Do not stop taking the antiviral even if you start to feel better. Be aware of when antibiotics are needed and when they are not needed. Antibiotics do not treat viruses. You may get an antibiotic if your health care provider thinks that you may have, or are at risk for, a bacterial infection and you have a viral infection. Do not ask for an antibiotic prescription if you have been diagnosed with a viral illness. Antibiotics will not make your illness go away faster. Frequently taking antibiotics when they are not needed can lead to antibiotic resistance. When this develops, the medicine no longer works against the bacteria that it normally fights. General instructions  Drink enough fluids to keep your urine pale yellow. Rest as much as possible. Return to your normal activities as told by your health care provider. Ask your health care provider what activities are safe for you. Keep all follow-up visits as told by your health care provider. This is important. How is this prevented? To reduce your risk of viral illness: Wash your hands often with soap and water for at least 20 seconds. If soap and water are not  available, use hand sanitizer. Avoid touching your nose, eyes, and mouth, especially if you have not washed your hands recently. If anyone in your household has a viral infection, clean all household surfaces that may have been in contact with the virus. Use soap and hot water. You may also use bleach that you have added water to (diluted). Stay away from people who are sick with symptoms of a viral infection.  Do not share items such as toothbrushes and water bottles with other people. Keep your vaccinations up to date. This includes getting a yearly flu shot. Eat a healthy diet and get plenty of rest. Contact a health care provider if: You have symptoms of a viral illness that do not go away. Your symptoms come back after going away. Your symptoms get worse. Get help right away if you have: Trouble breathing. A severe headache or a stiff neck. Severe vomiting or pain in your abdomen. These symptoms may represent a serious problem that is an emergency. Do not wait to see if the symptoms will go away. Get medical help right away. Call your local emergency services (911 in the [place] Do not drive yourself to the hospital. Summary Viruses are types of germs that can get into a person's body and cause illness. Viral illnesses can range from mild to severe. They can affect various parts of the body. Viruses can be hard to treat. There are medicines to relieve symptoms, and there are some antiviral medicines. If you were prescribed an antiviral medicine, take it as told by your health care provider. Do not stop taking the antiviral even if you start to feel better. Contact a health care provider if you have symptoms of a viral illness that do not go away. This information is not intended to replace advice given to you by your health care provider. Make sure you discuss any questions you have with your health care provider. Document Revised: 10/31/2019 Document Reviewed: 04/26/2019 Elsevier Patient  Education  [place], MD [facility]

## 2022-03-06 NOTE — Patient Instructions (Signed)

## 2022-03-07 ENCOUNTER — Ambulatory Visit (HOSPITAL_BASED_OUTPATIENT_CLINIC_OR_DEPARTMENT_OTHER)
Admission: RE | Admit: 2022-03-07 | Discharge: 2022-03-07 | Disposition: A | Discharge status: Home / Self Care | Payer: Medicare HMO | Source: Ambulatory Visit | Attending: Cardiovascular Disease | Admitting: Cardiovascular Disease

## 2022-03-07 ENCOUNTER — Ambulatory Visit (HOSPITAL_COMMUNITY)
Admission: RE | Admit: 2022-03-07 | Discharge: 2022-03-07 | Disposition: A | Discharge status: Home / Self Care | Payer: Medicare HMO | Source: Ambulatory Visit | Attending: Cardiovascular Disease | Admitting: Cardiovascular Disease

## 2022-03-07 DIAGNOSIS — I2510 Atherosclerotic heart disease of native coronary artery without angina pectoris: Secondary | ICD-10-CM

## 2022-03-07 DIAGNOSIS — I739 Peripheral vascular disease, unspecified: Secondary | ICD-10-CM

## 2022-03-07 DIAGNOSIS — I6523 Occlusion and stenosis of bilateral carotid arteries: Secondary | ICD-10-CM | POA: Insufficient documentation

## 2022-03-08 ENCOUNTER — Other Ambulatory Visit: Payer: Self-pay | Admitting: Internal Medicine

## 2022-03-08 DIAGNOSIS — I739 Peripheral vascular disease, unspecified: Secondary | ICD-10-CM | POA: Insufficient documentation

## 2022-03-11 ENCOUNTER — Ambulatory Visit: Payer: Medicare HMO | Admitting: Vascular Surgery

## 2022-03-11 VITALS — BP 128/77 | HR 79 | Temp 97.4°F | Resp 14 | Ht 60.0 in | Wt 129.0 lb

## 2022-03-11 DIAGNOSIS — I739 Peripheral vascular disease, unspecified: Secondary | ICD-10-CM | POA: Diagnosis not present

## 2022-03-11 MED ORDER — CILOSTAZOL 100 MG PO TABS: 100.0000 mg | ORAL_TABLET | Freq: Two times a day (BID) | ORAL | 11 refills | Status: DC

## 2022-03-11 NOTE — Progress Notes (Signed)
Patient name: Hannah [name] MRN: [id number] DOB: 17-May-1951 Sex: female  REASON FOR CONSULT: Left leg PAD  HPI: Hannah [name] is a 71 y.o. female, with history of hypertension, hyperlipidemia, peripheral vascular disease, stage IV non-small cell lung cancer that presents for evaluation of PAD in the left lower extremity.  Patient describes about 4 months ago she started getting cramping in the left calf.  This is much worse with exertion.  She was previously going to the [facility] and walking 10 laps on the track.  Since her calf cramping started she has been unable to do this.  He is also concerned about her lower extremity swelling.  She is also getting chemotherapy for her lung cancer.  ABIs on 03/07/2022 were 1.08 on the right multiphasic and 0.87 left biphasic.  Lower extremity arterial duplex showed a 50 to 74% stenosis in the left SFA and a 75%-99% stenosis in the mid pop.  Also found to have a 60 to 79% left ICA stenosis.  No history of stroke or TIA.  Past Medical History:  Diagnosis Date   Allergy    Anemia    Anxiety    Arthritis    Carpal tunnel syndrome    Constipation    senna C stool softeners help    Depression    Diverticulosis    Dyslipidemia    External hemorrhoids    GERD (gastroesophageal reflux disease)    Heart murmur    mild-moderate AR   Hiatal hernia    Hyperlipidemia    on meds    Hypertension    Internal hemorrhoids    lung ca with brain mets 07/2020   Osteoarthritis    Pre-diabetes    PVD (peripheral vascular disease) (HCC)    moderate carotid disease   RBBB    Smoker    Vocal cord polyps     Past Surgical History:  Procedure Laterality Date   ABDOMINAL HYSTERECTOMY  0923   APPLICATION OF CRANIAL NAVIGATION N/A 08/24/2020   Procedure: APPLICATION OF CRANIAL NAVIGATION;  Surgeon: Judith Part, MD;  Location: [place];  Service: Neurosurgery;  Laterality: N/A;   COLONOSCOPY     COLONOSCOPY W/ POLYPECTOMY  2009   CRANIOTOMY Left 08/24/2020    Procedure: Left Craniotomy for Tumor Resection with Brainlab;  Surgeon: Judith Part, MD;  Location: [place];  Service: Neurosurgery;  Laterality: Left;   HEMORRHOID SURGERY     INTERCOSTAL NERVE BLOCK Right 09/24/2020   Procedure: INTERCOSTAL NERVE BLOCK;  Surgeon: Melrose Nakayama, MD;  Location: [place];  Service: Thoracic;  Laterality: Right;   JOINT REPLACEMENT     LYMPH NODE DISSECTION Right 09/24/2020   Procedure: LYMPH NODE DISSECTION;  Surgeon: Melrose Nakayama, MD;  Location: [place];  Service: Thoracic;  Laterality: Right;   POLYPECTOMY     right total knee arthroplasty     Dr. Emeterio Reeve 06-04-18   [place]  08/16/2009   TOTAL KNEE ARTHROPLASTY Left 02/13/2014   Procedure: TOTAL KNEE ARTHROPLASTY;  Surgeon: Alta Corning, MD;  Location: [place];  Service: Orthopedics;  Laterality: Left;   TOTAL KNEE ARTHROPLASTY Right 06/04/2018   Procedure: RIGHT TOTAL KNEE ARTHROPLASTY;  Surgeon: Dorna Leitz, MD;  Location: [facility];  Service: Orthopedics;  Laterality: Right;  Adductor Block   TUBAL LIGATION      Family History  Problem Relation Age of Onset   Cancer Father    Prostate cancer Father    Diabetes Sister  Cancer Sister    Stroke Maternal Grandfather    Colitis Maternal Aunt    Breast cancer Maternal Aunt    Colon cancer Neg Hx    Colon polyps Neg Hx    Rectal cancer Neg Hx    Stomach cancer Neg Hx    Esophageal cancer Neg Hx     SOCIAL HISTORY: Social History   Socioeconomic History   Marital status: Married    Spouse name: Not on file   Number of children: 2   Years of education: Not on file   Highest education level: Not on file  Occupational History   Not on file  Tobacco Use   Smoking status: Former    Packs/day: 0.25    Years: 40.00    Total pack years: 10.00    Types: Cigarettes    Quit date: 07/09/2020    Years since quitting: 1.6    Passive exposure: Never   Smokeless tobacco: Never   Tobacco comments:    PACK WILL LAST 3 days   Vaping Use   Vaping Use: Never used  Substance and Sexual Activity   Alcohol use: No   Drug use: No   Sexual activity: Not Currently  Other Topics Concern   Not on file  Social History Narrative   Not on file   Social Determinants of Health   Financial Resource Strain: Low Risk  (10/10/2020)   Overall Financial Resource Strain (CARDIA)    Difficulty of Paying Living Expenses: Not hard at all  Food Insecurity: No Food Insecurity (01/01/2022)   Hunger Vital Sign    Worried About Running Out of Food in the Last Year: Never true    [place] in the Last Year: Never true  Transportation Needs: No Transportation Needs (01/01/2022)   PRAPARE - Hydrologist (Medical): No    Lack of Transportation (Non-Medical): No  Physical Activity: Sufficiently Active (04/12/2021)   Exercise Vital Sign    Days of Exercise per Week: 3 days    Minutes of Exercise per Session: 60 min  Stress: No Stress Concern Present (04/12/2021)   [place]    Feeling of Stress : Not at all  Social Connections: [place] (04/12/2021)   Social Connection and Isolation Panel [NHANES]    Frequency of Communication with Friends and Family: More than three times a week    Frequency of Social Gatherings with Friends and Family: More than three times a week    Attends Religious Services: More than 4 times per year    Active Member of [employer] or Organizations: Yes    Attends Music therapist: More than 4 times per year    Marital Status: Married  Human resources officer Violence: Not At Risk (04/12/2021)   Humiliation, Afraid, Rape, and Kick questionnaire    Fear of Current or Ex-Partner: No    Emotionally Abused: No    Physically Abused: No    Sexually Abused: No    Allergies  Allergen Reactions   Amlodipine Swelling and Rash    Rash, swelling   Chantix [Varenicline Tartrate] Shortness Of Breath,  Swelling and Other (See Comments)    Tongue swell,sob   Clarithromycin Rash   Lisinopril Hives   Simvastatin Hives   Wellbutrin [Bupropion Hcl] Hives   Lipitor [Atorvastatin]     Dizziness per patient   Sertraline     Makes her feel like she is going to  kill someone    Current Outpatient Medications  Medication Sig Dispense Refill   acetaminophen (TYLENOL) 325 MG tablet Take 325 mg by mouth every 6 (six) hours as needed for moderate pain.     ALPRAZolam (XANAX XR) 0.5 MG 24 hr tablet Take 1 tablet (0.5 mg total) by mouth daily. 30 tablet 5   ALPRAZolam (XANAX) 0.5 MG tablet TAKE 1 TABLET BY MOUTH THREE TIMES DAILY AS NEEDED FOR ANXIETY 90 tablet 0   Ascorbic Acid (VITAMIN C) 1000 MG tablet Take 1,000 mg by mouth daily.     aspirin 81 MG tablet Take 1 tablet (81 mg total) by mouth daily. Restart on 08/31/20 30 tablet    bisacodyl (DULCOLAX) 5 MG EC tablet Take 5 mg by mouth daily as needed for moderate constipation.     carboxymethylcellulose (REFRESH PLUS) 0.5 % SOLN Place 1-2 drops into both eyes 3 (three) times daily as needed (dry eyes).     cephALEXin (KEFLEX) 500 MG capsule Take 1 capsule (500 mg total) by mouth 4 (four) times daily. 20 capsule 0   Cholecalciferol (D3 PO) Take 1 capsule by mouth daily.     citalopram (CELEXA) 10 MG tablet Take 1 tablet (10 mg total) by mouth daily. 90 tablet 1   fluticasone (FLONASE) 50 MCG/ACT nasal spray PLACE 2 SPRAYS INTO BOTH NOSTRILS DAILY. (Patient taking differently: Place 2 sprays into both nostrils daily as needed for allergies.) 48 g 1   folic acid (FOLVITE) 1 MG tablet Take 1 tablet (1 mg total) by mouth daily. 30 tablet 3   Lacosamide 100 MG TABS Take 1 tablet (100 mg total) by mouth 2 (two) times daily. 60 tablet 3   levETIRAcetam (KEPPRA) 100 MG/ML solution TAKE 15 ML BY MOUTH TWICE DAILY 473 mL 0   loratadine (CLARITIN) 10 MG tablet Take 10 mg by mouth daily as needed for allergies.     magnesium hydroxide (MILK OF MAGNESIA) 800 MG/5ML  suspension Take 30 mLs by mouth daily as needed for constipation.     Menthol, Topical Analgesic, (BIOFREEZE EX) Apply 1 application topically as needed (pain).     mirtazapine (REMERON) 15 MG tablet Take 7.5 mg by mouth at bedtime.     olopatadine (PATANOL) 0.1 % ophthalmic solution Place 1 drop into both eyes 2 (two) times daily as needed for allergies.     omeprazole (PRILOSEC) 40 MG capsule TAKE 1 CAPSULE TWICE DAILY BEFORE MEALS (Patient taking differently: Take 40 mg by mouth daily.) 180 capsule 0   polyethylene glycol (MIRALAX / GLYCOLAX) 17 g packet Take 17 g by mouth 2 (two) times daily. 72 each 0   pravastatin (PRAVACHOL) 40 MG tablet TAKE 1 TABLET (40 MG TOTAL) BY MOUTH EVERY EVENING. (Patient taking differently: Take 40 mg by mouth daily.) 90 tablet 0   Probiotic Product (PROBIOTIC DAILY PO) Take 1 capsule by mouth daily.     prochlorperazine (COMPAZINE) 10 MG tablet TAKE 1 TABLET BY MOUTH EVERY 6 HOURS AS NEEDED FOR NAUSEA OR VOMITING 30 tablet 0   simethicone (MYLICON) 80 MG chewable tablet Chew 1 tablet (80 mg total) by mouth 4 (four) times daily as needed for flatulence (Bloating). 30 tablet 0   No current facility-administered medications for this visit.    REVIEW OF SYSTEMS:  [X]  denotes positive finding, [ ]  denotes negative finding Cardiac  Comments:  Chest pain or chest pressure:    Shortness of breath upon exertion:    Short of breath when lying flat:  Irregular heart rhythm:        Vascular    Pain in calf, thigh, or hip brought on by ambulation: x Left  Pain in feet at night that wakes you up from your sleep:     Blood clot in your veins:    Leg swelling:         Pulmonary    Oxygen at home:    Productive cough:     Wheezing:         Neurologic    Sudden weakness in arms or legs:     Sudden numbness in arms or legs:     Sudden onset of difficulty speaking or slurred speech:    Temporary loss of vision in one eye:     Problems with dizziness:          Gastrointestinal    Blood in stool:     Vomited blood:         Genitourinary    Burning when urinating:     Blood in urine:        Psychiatric    Major depression:         Hematologic    Bleeding problems:    Problems with blood clotting too easily:        Skin    Rashes or ulcers:        Constitutional    Fever or chills:      PHYSICAL EXAM: Vitals:   03/11/22 1442  BP: 128/77  Pulse: 79  Resp: 14  Temp: (!) 97.4 F (36.3 C)  TempSrc: Temporal  SpO2: 96%  Weight: 129 lb (58.5 kg)  Height: 5' (1.524 m)    GENERAL: The patient is a well-nourished female, in no acute distress. The vital signs are documented above. CARDIAC: There is a regular rate and rhythm.  VASCULAR:  Bilateral femoral pulses palpable Bilateral DP pulses palpable PULMONARY: No respiratory distress. ABDOMEN: Soft and non-tender. MUSCULOSKELETAL: There are no major deformities or cyanosis. NEUROLOGIC: No focal weakness or paresthesias are detected. SKIN: There are no ulcers or rashes noted. PSYCHIATRIC: The patient has a normal affect.  DATA:   ABIs on 03/07/2022 were 1.08 on the right multiphasic and 0.87 left biphasic.     Lower extremity arterial duplex showed a 50 to 74% stenosis in the left SFA and a 75%-99% stenosis in the mid popliteal artery.  Assessment/Plan:  71 year old female presents for evaluation of PAD in her left lower extremity.  Her symptoms are consistent with claudication in the left lower extremity.  Discussed that claudication is typically managed conservatively.  Discussed that claudication is not limb threatening.  Her ABIs are mildly abnormal at 0.87 biphasic in the left leg.  She has a palpable DP pulse in the left foot.  Left leg arterial duplex shows a high-grade stenosis in the mid popliteal artery but again I do not think this requires intervention based on her symptoms with only mildly abnormal ABI's and a palpable pedal pulse.  No signs of critical limb ischemia  with rest pain or tissue loss.  We will start her on Pletal.  I will see her in 3 months with ABIs.   Marty Heck, MD [facility] Office: [phone]

## 2022-03-14 ENCOUNTER — Other Ambulatory Visit: Payer: Self-pay

## 2022-03-14 DIAGNOSIS — I739 Peripheral vascular disease, unspecified: Secondary | ICD-10-CM

## 2022-03-18 ENCOUNTER — Other Ambulatory Visit: Payer: Self-pay

## 2022-03-18 NOTE — Progress Notes (Unsigned)
[facility] OFFICE PROGRESS NOTE  [name], MD [place] [address]  DIAGNOSIS: Stage IV non-small cell lung cancer (T3, N0, M1C) adenocarcinoma.  The patient presented with a right upper lobe lung mass and solitary brain metastasis.  The patient was diagnosed in February 2022.   Biomarker Findings Microsatellite status - MS-Stable Tumor Mutational Burden - 8 Muts/Mb Genomic Findings For a complete list of the genes assayed, please refer to the Appendix. KRAS G12V KEAP1 S224F TP53 P151T 7 Disease relevant genes with no reportable alterations: ALK, BRAF, EGFR, ERBB2, MET, RET, ROS1   PDL1 Expression: 90%  PRIOR THERAPY: 1) SRS to the metastatic brain lesion on 08/23/2020 under the care of Dr. Tammi Klippel and craniotomy under the care of Dr. Zada Finders on 08/24/2020. 2) S/p robotic assisted right upper lobectomy with en bloc wedge resection of the right middle lobe and lymph node dissection under the care of Dr. Roxan Hockey on September 24, 2020 3) SRS to the two new subcentimeter metastases under the care of Dr. Tammi Klippel on 11/29/20.  4) 10/14/21: L frontoparietal progression, undergoes LITT at [facility]) 5) SRS to brain metastasis, last dose on 11/18/21  CURRENT THERAPY: Systemic chemotherapy with carboplatin for AUC of 5, Alimta 500 Mg/M2 and Keytruda 200 mg IV every 3 weeks.  First dose Nov 08, 2020. Status post 22 cycles.  Starting from cycle #5, the patient started maintenance Alimta and Keytruda.  INTERVAL HISTORY: Hannah [name] 70 y.o. female returns to the clinic today for a follow-up visit. The patient is feeling well today without any concerning complaints.  She had been reporting fatigue and some of her doses of her medications were cut back. Weight loss? She does drink protein drinks.  She typically only eats 1 big meal a day though.  The patient discontinued Remeron due to fatigue.    Otherwise, she is tolerating her treatment with Keytruda  and Alimta well without any concerning adverse side effects. Denies any fever or chills.  She sometimes has night sweats but reports it is environmental to too many covers.  Denies any shortness of breath. Denies cough. Denies any chest discomfort or hemoptysis.  Denies any nausea, vomiting, diarrhea, or constipation. She denies any headache or visual changes. Sees neurooncology, also at [facility] and [employer]. She is here today for evaluation and repeat blood work before starting cycle #22.   MEDICAL HISTORY: Past Medical History:  Diagnosis Date   Allergy    Anemia    Anxiety    Arthritis    Carpal tunnel syndrome    Constipation    senna C stool softeners help    Depression    Diverticulosis    Dyslipidemia    External hemorrhoids    GERD (gastroesophageal reflux disease)    Heart murmur    mild-moderate AR   Hiatal hernia    Hyperlipidemia    on meds    Hypertension    Internal hemorrhoids    lung ca with brain mets 07/2020   Osteoarthritis    Pre-diabetes    PVD (peripheral vascular disease) (HCC)    moderate carotid disease   RBBB    Smoker    Vocal cord polyps     ALLERGIES:  is allergic to amlodipine, chantix [varenicline tartrate], clarithromycin, lisinopril, simvastatin, wellbutrin [bupropion hcl], lipitor [atorvastatin], and sertraline.  MEDICATIONS:  Current Outpatient Medications  Medication Sig Dispense Refill   acetaminophen (TYLENOL) 325 MG tablet Take 325 mg by mouth every 6 (  six) hours as needed for moderate pain.     ALPRAZolam (XANAX XR) 0.5 MG 24 hr tablet Take 1 tablet (0.5 mg total) by mouth daily. 30 tablet 5   ALPRAZolam (XANAX) 0.5 MG tablet TAKE 1 TABLET BY MOUTH THREE TIMES DAILY AS NEEDED FOR ANXIETY 90 tablet 0   Ascorbic Acid (VITAMIN C) 1000 MG tablet Take 1,000 mg by mouth daily.     aspirin 81 MG tablet Take 1 tablet (81 mg total) by mouth daily. Restart on 08/31/20 30 tablet    bisacodyl (DULCOLAX) 5 MG EC tablet Take 5 mg by mouth daily as needed  for moderate constipation.     carboxymethylcellulose (REFRESH PLUS) 0.5 % SOLN Place 1-2 drops into both eyes 3 (three) times daily as needed (dry eyes).     cephALEXin (KEFLEX) 500 MG capsule Take 1 capsule (500 mg total) by mouth 4 (four) times daily. 20 capsule 0   Cholecalciferol (D3 PO) Take 1 capsule by mouth daily.     cilostazol (PLETAL) 100 MG tablet Take 1 tablet (100 mg total) by mouth 2 (two) times daily before a meal. 60 tablet 11   citalopram (CELEXA) 10 MG tablet Take 1 tablet (10 mg total) by mouth daily. 90 tablet 1   fluticasone (FLONASE) 50 MCG/ACT nasal spray PLACE 2 SPRAYS INTO BOTH NOSTRILS DAILY. (Patient taking differently: Place 2 sprays into both nostrils daily as needed for allergies.) 48 g 1   folic acid (FOLVITE) 1 MG tablet Take 1 tablet (1 mg total) by mouth daily. 30 tablet 3   Lacosamide 100 MG TABS Take 1 tablet (100 mg total) by mouth 2 (two) times daily. 60 tablet 3   levETIRAcetam (KEPPRA) 100 MG/ML solution TAKE 15 ML BY MOUTH TWICE DAILY 473 mL 0   loratadine (CLARITIN) 10 MG tablet Take 10 mg by mouth daily as needed for allergies.     magnesium hydroxide (MILK OF MAGNESIA) 800 MG/5ML suspension Take 30 mLs by mouth daily as needed for constipation.     Menthol, Topical Analgesic, (BIOFREEZE EX) Apply 1 application topically as needed (pain).     mirtazapine (REMERON) 15 MG tablet Take 7.5 mg by mouth at bedtime.     olopatadine (PATANOL) 0.1 % ophthalmic solution Place 1 drop into both eyes 2 (two) times daily as needed for allergies.     omeprazole (PRILOSEC) 40 MG capsule TAKE 1 CAPSULE TWICE DAILY BEFORE MEALS (Patient taking differently: Take 40 mg by mouth daily.) 180 capsule 0   polyethylene glycol (MIRALAX / GLYCOLAX) 17 g packet Take 17 g by mouth 2 (two) times daily. 72 each 0   pravastatin (PRAVACHOL) 40 MG tablet TAKE 1 TABLET (40 MG TOTAL) BY MOUTH EVERY EVENING. (Patient taking differently: Take 40 mg by mouth daily.) 90 tablet 0   Probiotic  Product (PROBIOTIC DAILY PO) Take 1 capsule by mouth daily.     prochlorperazine (COMPAZINE) 10 MG tablet TAKE 1 TABLET BY MOUTH EVERY 6 HOURS AS NEEDED FOR NAUSEA OR VOMITING 30 tablet 0   simethicone (MYLICON) 80 MG chewable tablet Chew 1 tablet (80 mg total) by mouth 4 (four) times daily as needed for flatulence (Bloating). 30 tablet 0   No current facility-administered medications for this visit.    SURGICAL HISTORY:  Past Surgical History:  Procedure Laterality Date   ABDOMINAL HYSTERECTOMY  4854   APPLICATION OF CRANIAL NAVIGATION N/A 08/24/2020   Procedure: APPLICATION OF CRANIAL NAVIGATION;  Surgeon: Judith Part, MD;  Location: [facility]  OR;  Service: Neurosurgery;  Laterality: N/A;   COLONOSCOPY     COLONOSCOPY W/ POLYPECTOMY  2009   CRANIOTOMY Left 08/24/2020   Procedure: Left Craniotomy for Tumor Resection with Brainlab;  Surgeon: Judith Part, MD;  Location: [facility];  Service: Neurosurgery;  Laterality: Left;   HEMORRHOID SURGERY     INTERCOSTAL NERVE BLOCK Right 09/24/2020   Procedure: INTERCOSTAL NERVE BLOCK;  Surgeon: Melrose Nakayama, MD;  Location: [facility];  Service: Thoracic;  Laterality: Right;   JOINT REPLACEMENT     LYMPH NODE DISSECTION Right 09/24/2020   Procedure: LYMPH NODE DISSECTION;  Surgeon: Melrose Nakayama, MD;  Location: [facility];  Service: Thoracic;  Laterality: Right;   POLYPECTOMY     right total knee arthroplasty     Dr. Emeterio Reeve 06-04-18   [place]  08/16/2009   TOTAL KNEE ARTHROPLASTY Left 02/13/2014   Procedure: TOTAL KNEE ARTHROPLASTY;  Surgeon: Alta Corning, MD;  Location: [facility];  Service: Orthopedics;  Laterality: Left;   TOTAL KNEE ARTHROPLASTY Right 06/04/2018   Procedure: RIGHT TOTAL KNEE ARTHROPLASTY;  Surgeon: Dorna Leitz, MD;  Location: [facility];  Service: Orthopedics;  Laterality: Right;  Adductor Block   TUBAL LIGATION      REVIEW OF SYSTEMS:   Review of Systems  Constitutional: Negative for appetite change, chills, fatigue,  fever and unexpected weight change.  HENT:   Negative for mouth sores, nosebleeds, sore throat and trouble swallowing.   Eyes: Negative for eye problems and icterus.  Respiratory: Negative for cough, hemoptysis, shortness of breath and wheezing.   Cardiovascular: Negative for chest pain and leg swelling.  Gastrointestinal: Negative for abdominal pain, constipation, diarrhea, nausea and vomiting.  Genitourinary: Negative for bladder incontinence, difficulty urinating, dysuria, frequency and hematuria.   Musculoskeletal: Negative for back pain, gait problem, neck pain and neck stiffness.  Skin: Negative for itching and rash.  Neurological: Negative for dizziness, extremity weakness, gait problem, headaches, light-headedness and seizures.  Hematological: Negative for adenopathy. Does not bruise/bleed easily.  Psychiatric/Behavioral: Negative for confusion, depression and sleep disturbance. The patient is not nervous/anxious.     PHYSICAL EXAMINATION:  There were no vitals taken for this visit.  ECOG PERFORMANCE STATUS: {CHL ONC ECOG [id number]  Physical Exam  Constitutional: Oriented to person, place, and time and well-developed, well-nourished, and in no distress. No distress.  HENT:  Head: Normocephalic and atraumatic.  Mouth/Throat: Oropharynx is clear and moist. No oropharyngeal exudate.  Eyes: Conjunctivae are normal. Right eye exhibits no discharge. Left eye exhibits no discharge. No scleral icterus.  Neck: Normal range of motion. Neck supple.  Cardiovascular: Normal rate, regular rhythm, normal heart sounds and intact distal pulses.   Pulmonary/Chest: Effort normal and breath sounds normal. No respiratory distress. No wheezes. No rales.  Abdominal: Soft. Bowel sounds are normal. Exhibits no distension and no mass. There is no tenderness.  Musculoskeletal: Normal range of motion. Exhibits no edema.  Lymphadenopathy:    No cervical adenopathy.  Neurological: Alert and oriented  to person, place, and time. Exhibits normal muscle tone. Gait normal. Coordination normal.  Skin: Skin is warm and dry. No rash noted. Not diaphoretic. No erythema. No pallor.  Psychiatric: Mood, memory and judgment normal.  Vitals reviewed.  LABORATORY DATA: Lab Results  Component Value Date   WBC 3.6 (L) 03/06/2022   HGB 11.0 (L) 03/06/2022   HCT 34.3 (L) 03/06/2022   MCV 87.9 03/06/2022   PLT 174.0 03/06/2022      Chemistry  Component Value Date/Time   NA 140 03/06/2022 1139   K 4.3 03/06/2022 1139   CL 103 03/06/2022 1139   CO2 30 03/06/2022 1139   BUN 11 03/06/2022 1139   CREATININE 0.79 03/06/2022 1139   CREATININE 0.73 02/27/2022 0845   CREATININE 0.79 03/14/2020 1016      Component Value Date/Time   CALCIUM 9.9 03/06/2022 1139   ALKPHOS 70 03/06/2022 1139   AST 26 03/06/2022 1139   AST 30 02/27/2022 0845   ALT 20 03/06/2022 1139   ALT 21 02/27/2022 0845   BILITOT 0.3 03/06/2022 1139   BILITOT 0.2 (L) 02/27/2022 0845       RADIOGRAPHIC STUDIES:  LE ART SEG MULTI (Segm & LE Reynauds)  Result Date: 03/07/2022  LOWER EXTREMITY DOPPLER STUDY Patient Name:  AMIRIA ORRISON  Date of Exam:   03/07/2022 Medical Rec #: [id number]        Accession #:    [id number] Date of Birth: 09/03/1950         Patient Gender: F Patient Age:   4 years Exam Location:  [facility] Procedure:      VAS [place] LOWER EXT ART SEG MULTI (SEGMENTALS & LE RAYNAUDS) Referring Phys: STACY BURNS --------------------------------------------------------------------------------  Indications: Claudication. High Risk Factors: Hypertension, hyperlipidemia, past history of smoking. Other Factors: Patient complains of calf pain when walking about 1/4 mile. The                left leg is worse than the right. She was able to do ten laps and                now can not make it to three laps before feeling pain. Her left                leg is worse than the right.  Performing Technologist: Wilkie Aye RVT  Examination  Guidelines: A complete evaluation includes at minimum, Doppler waveform signals and systolic blood pressure reading at the level of bilateral brachial, anterior tibial, and posterior tibial arteries, when vessel segments are accessible. Bilateral testing is considered an integral part of a complete examination. Photoelectric Plethysmograph (PPG) waveforms and toe systolic pressure readings are included as required and additional duplex testing as needed. Limited examinations for reoccurring indications may be performed as noted.  ABI Findings: +---------+------------------+-----+-----------+--------+ Right    Rt Pressure (mmHg)IndexWaveform   Comment  +---------+------------------+-----+-----------+--------+ Brachial 131                                        +---------+------------------+-----+-----------+--------+ CFA                             multiphasic         +---------+------------------+-----+-----------+--------+ Popliteal                       multiphasic         +---------+------------------+-----+-----------+--------+ PTA      135               1.03 multiphasic         +---------+------------------+-----+-----------+--------+ PERO     128               0.98 multiphasic         +---------+------------------+-----+-----------+--------+ DP       141  1.08 multiphasic         +---------+------------------+-----+-----------+--------+ Great Toe103               0.79 Normal              +---------+------------------+-----+-----------+--------+ +---------+------------------+-----+---------------+-------+ Left     Lt Pressure (mmHg)IndexWaveform       Comment +---------+------------------+-----+---------------+-------+ Brachial 125                                           +---------+------------------+-----+---------------+-------+ CFA                             multiphasic             +---------+------------------+-----+---------------+-------+ Popliteal                       multiphasic            +---------+------------------+-----+---------------+-------+ PTA      100               0.76 barely biphasic        +---------+------------------+-----+---------------+-------+ PERO     114               0.87 monophasic             +---------+------------------+-----+---------------+-------+ DP       107               0.82 monophasic             +---------+------------------+-----+---------------+-------+ Great Toe88                0.67 Normal                 +---------+------------------+-----+---------------+-------+   Summary: Right: Resting right ankle-brachial index is within normal range. The right toe-brachial index is normal. Left: Resting left ankle-brachial index indicates mild left lower extremity arterial disease. The left toe-brachial index is abnormal. *See table(s) above for measurements and observations.  Vascular consult recommended. Electronically signed by Jenkins Rouge MD on 03/07/2022 at 5:26:47 PM.    Final    VAS [place] LOWER EXTREMITY ARTERIAL DUPLEX  Result Date: 03/07/2022 LOWER EXTREMITY ARTERIAL DUPLEX STUDY Patient Name:  LOWELLA KINDLEY  Date of Exam:   03/07/2022 Medical Rec #: [id number]        Accession #:    [id number] Date of Birth: 11/20/1950         Patient Gender: F Patient Age:   77 years Exam Location:  [facility] Procedure:      VAS [place] LOWER EXTREMITY ARTERIAL DUPLEX Referring Phys: STACY BURNS --------------------------------------------------------------------------------  Indications: Claudication, and peripheral artery disease. High Risk Factors: Hypertension, hyperlipidemia, past history of smoking. Other Factors: Patient complains of calf pain when walking about 1/4 mile. The                left leg is worse than the right. She was able to do ten laps and                now can not make it to three laps before feeling pain. Her left                 leg is worse than the right.  Current ABI: Right 1.06; Left 0.87 Performing Technologist: Wilkie Aye RVT  Examination Guidelines:  A complete evaluation includes B-mode imaging, spectral Doppler, color Doppler, and power Doppler as needed of all accessible portions of each vessel. Bilateral testing is considered an integral part of a complete examination. Limited examinations for reoccurring indications may be performed as noted.  +----------+--------+-----+---------------+---------+--------+ RIGHT     PSV cm/sRatioStenosis       Waveform Comments +----------+--------+-----+---------------+---------+--------+ CFA Prox  137                         triphasic         +----------+--------+-----+---------------+---------+--------+ CFA Distal169          30-49% stenosisbiphasic          +----------+--------+-----+---------------+---------+--------+ DFA       46                          biphasic          +----------+--------+-----+---------------+---------+--------+ SFA Prox  137                         biphasic          +----------+--------+-----+---------------+---------+--------+ SFA Mid   101                         triphasic         +----------+--------+-----+---------------+---------+--------+ SFA Distal127                         triphasic         +----------+--------+-----+---------------+---------+--------+ POP Prox  85                          triphasic         +----------+--------+-----+---------------+---------+--------+ POP Distal68                          biphasic          +----------+--------+-----+---------------+---------+--------+ TP Trunk  88                          biphasic          +----------+--------+-----+---------------+---------+--------+ ATA Mid   62                          biphasic          +----------+--------+-----+---------------+---------+--------+ PTA Mid   59                          biphasic           +----------+--------+-----+---------------+---------+--------+ PERO Mid  83                          biphasic          +----------+--------+-----+---------------+---------+--------+  +----------+--------+-----+---------------+---------+--------+ LEFT      PSV cm/sRatioStenosis       Waveform Comments +----------+--------+-----+---------------+---------+--------+ CFA Prox  177                         biphasic          +----------+--------+-----+---------------+---------+--------+ CFA [id number]  biphasic          +----------+--------+-----+---------------+---------+--------+ DFA       117                         triphasic         +----------+--------+-----+---------------+---------+--------+ SFA Prox  289     2.7  50-74% stenosisbiphasic          +----------+--------+-----+---------------+---------+--------+ SFA Mid   101                         biphasic          +----------+--------+-----+---------------+---------+--------+ SFA Distal107                         biphasic          +----------+--------+-----+---------------+---------+--------+ POP Prox  71                          biphasic          +----------+--------+-----+---------------+---------+--------+ POP Mid   229     5.5  75-99% stenosis                  +----------+--------+-----+---------------+---------+--------+ POP Distal42                                            +----------+--------+-----+---------------+---------+--------+ TP Trunk  174          50-74% stenosis                  +----------+--------+-----+---------------+---------+--------+ ATA Prox  20                          biphasic          +----------+--------+-----+---------------+---------+--------+ PTA Prox  24                                            +----------+--------+-----+---------------+---------+--------+ PTA Mid                                biphasic          +----------+--------+-----+---------------+---------+--------+ PERO Prox 31                          biphasic          +----------+--------+-----+---------------+---------+--------+ A focal velocity elevation of 289 cm/s was obtained at Prox SFA with post stenotic turbulence with a VR of 2.7. Findings are characteristic of 50-74% stenosis. A 2nd focal velocity elevation was visualized, measuring 229 cm/s at Mid/distal Popliteal with  a VR of 5.45. Findings are characteristic of 75-99% stenosis. A 3rd focal velocity elevation was visualized, measuring 174 cm/s at TPT with a VR of 2.85. Findings are characteristic of 50-74% stenosis.  Summary: Right: 30-49% stenosis noted in the common femoral artery. Atherosclerosis in the common femoral, femoral and popliteal arteries. Three vessel run off. Left: 50-74% stenosis noted in the superficial femoral artery. 75-99% stenosis noted in the popliteal artery. Atherosclerosis in the common femoral, femoral and popliteal arteries. Three vessel run  off.  See table(s) above for measurements and observations. Vascular consult recommended. Electronically signed by Jenkins Rouge MD on 03/07/2022 at 1:38:47 PM.    Final    VAS US CAROTID  Result Date: 03/07/2022 Carotid Arterial Duplex Study Patient Name:  ZIYANNA TOLIN  Date of Exam:   03/07/2022 Medical Rec #: [id number]        Accession #:    [id number] Date of Birth: 02-18-1951         Patient Gender: F Patient Age:   14 years Exam Location:  [facility] Procedure:      VAS US CAROTID Referring Phys: Marzetta Board BURNS --------------------------------------------------------------------------------  Indications:       Carotid artery disease. Risk Factors:      Hypertension, hyperlipidemia, past history of smoking, PAD. Other Factors:     Patient denies any cerebrovascular symptoms. Comparison Study:  12/05/35-TGGY 694/85 cm/s; LICA 462/70 cm/s Performing Technologist: Wilkie Aye RVT  Examination Guidelines: A  complete evaluation includes B-mode imaging, spectral Doppler, color Doppler, and power Doppler as needed of all accessible portions of each vessel. Bilateral testing is considered an integral part of a complete examination. Limited examinations for reoccurring indications may be performed as noted.  Right Carotid Findings: +----------+--------+--------+--------+------------------+--------+           PSV cm/sEDV cm/sStenosisPlaque DescriptionComments +----------+--------+--------+--------+------------------+--------+ CCA Prox  50      13                                         +----------+--------+--------+--------+------------------+--------+ CCA Distal36      13                                         +----------+--------+--------+--------+------------------+--------+ ICA Prox  79      34      1-39%   heterogenous               +----------+--------+--------+--------+------------------+--------+ ICA Mid   66      21                                tortuous +----------+--------+--------+--------+------------------+--------+ ICA Distal76      27                                tortuous +----------+--------+--------+--------+------------------+--------+ ECA       96                      heterogenous               +----------+--------+--------+--------+------------------+--------+ +----------+--------+-------+----------------+-------------------+           PSV cm/sEDV cmsDescribe        Arm Pressure (mmHG) +----------+--------+-------+----------------+-------------------+ [id number]            Multiphasic, [id number]                 +----------+--------+-------+----------------+-------------------+ +---------+--------+--+--------+--+---------+ VertebralPSV cm/s49EDV cm/s16Antegrade +---------+--------+--+--------+--+---------+  Left Carotid Findings: +----------+--------+--------+--------+------------------+--------+           PSV cm/sEDV  cm/sStenosisPlaque DescriptionComments +----------+--------+--------+--------+------------------+--------+ CCA Prox  67      15                                         +----------+--------+--------+--------+------------------+--------+  CCA Distal46      14                                         +----------+--------+--------+--------+------------------+--------+ ICA Prox  201     69      60-79%  heterogenous               +----------+--------+--------+--------+------------------+--------+ ICA Mid   95      30                                tortuous +----------+--------+--------+--------+------------------+--------+ ICA Distal99      40                                tortuous +----------+--------+--------+--------+------------------+--------+ ECA       91                      heterogenous               +----------+--------+--------+--------+------------------+--------+ +----------+--------+--------+----------------+-------------------+           PSV cm/sEDV cm/sDescribe        Arm Pressure (mmHG) +----------+--------+--------+----------------+-------------------+ [id number]              Multiphasic, [id number]                 +----------+--------+--------+----------------+-------------------+ +---------+--------+--+--------+--+---------+ VertebralPSV cm/s54EDV cm/s19Antegrade +---------+--------+--+--------+--+---------+   Summary: Right Carotid: Velocities in the right ICA are consistent with a 1-39% stenosis. Left Carotid: Velocities in the left ICA are consistent with a 60-79% stenosis. Vertebrals:  Bilateral vertebral arteries demonstrate antegrade flow. Subclavians: Normal flow hemodynamics were seen in bilateral subclavian              arteries. *See table(s) above for measurements and observations. Suggest follow up study in 12 months. Electronically signed by Jenkins Rouge MD on 03/07/2022 at 11:24:09 AM.    Final    CT Abdomen Pelvis W  Contrast  Result Date: 02/26/2022 CLINICAL DATA:  Non-small cell lung cancer restaging * Tracking Code: BO * EXAM: CT CHEST, ABDOMEN, AND PELVIS WITH CONTRAST TECHNIQUE: Multidetector CT imaging of the chest, abdomen and pelvis was performed following the standard protocol during bolus administration of intravenous contrast. RADIATION DOSE REDUCTION: This exam was performed according to the departmental dose-optimization program which includes automated exposure control, adjustment of the mA and/or kV according to patient size and/or use of iterative reconstruction technique. CONTRAST:  187mL ISOVUE-300 IOPAMIDOL (ISOVUE-300) INJECTION 61%, additional oral enteric contrast COMPARISON:  CT chest abdomen pelvis, 12/24/2021 FINDINGS: CT CHEST FINDINGS Cardiovascular: Aortic atherosclerosis. Normal heart size. Left coronary artery calcifications. No pericardial effusion. Mediastinum/Nodes: No enlarged mediastinal, hilar, or axillary lymph nodes. Thyroid gland, trachea, and esophagus demonstrate no significant findings. Lungs/Pleura: Status post right upper lobectomy and additional wedge resection of the right middle lobe. Unchanged, trace right pleural effusion. Tiny left pulmonary nodules are unchanged, including a 0.2 cm nodule of the peripheral left upper lobe (series 4, image 81) and a 0.3 cm nodule of the superior segment left lower lobe (series 4, image 77). No new nodules. Musculoskeletal: No chest wall abnormality. No acute osseous findings. CT ABDOMEN PELVIS FINDINGS Hepatobiliary: No solid liver abnormality is seen. Faintly rim calcified gallstones. No gallbladder wall thickening, or biliary dilatation. Pancreas: Unremarkable.  No pancreatic ductal dilatation or surrounding inflammatory changes. Spleen: Normal in size without significant abnormality. Adrenals/Urinary Tract: Adrenal glands are unremarkable. Kidneys are normal, without renal calculi, solid lesion, or hydronephrosis. Tiny focus of air in the  urinary bladder. Stomach/Bowel: Stomach is within normal limits. Appendix appears normal. No evidence of bowel wall thickening, distention, or inflammatory changes. Occasional sigmoid diverticula. Vascular/Lymphatic: Aortic atherosclerosis. No enlarged abdominal or pelvic lymph nodes. Reproductive: Status post hysterectomy. Other: No abdominal wall hernia or abnormality. No ascites. Musculoskeletal: No acute osseous findings. Unchanged, benign lipoma near the insertion of the iliacus on the lesser trochanter (series 2, image 108). IMPRESSION: 1. Status post right upper lobectomy and additional wedge resection of the right middle lobe. Unchanged, trace right pleural effusion. 2. Tiny left pulmonary nodules are unchanged, almost certainly benign and incidental. No new nodules. Attention on follow-up. 3. No evidence of lymphadenopathy or metastatic disease in the chest, abdomen or pelvis. 4. Cholelithiasis. 5. Tiny focus of air in the urinary bladder, likely due to recent catheterization. In the absence of catheterization, an alternate explanation for air within the urinary tract is bowel or other fistula, however there are no adjacent inflammatory findings of the bowel or other imaging findings to suggest fistula at this time. 6. Coronary artery disease. Aortic Atherosclerosis (ICD10-I70.0). Electronically Signed   By: Delanna Ahmadi M.D.   On: 02/26/2022 08:44   CT Chest W Contrast  Result Date: 02/26/2022 CLINICAL DATA:  Non-small cell lung cancer restaging * Tracking Code: BO * EXAM: CT CHEST, ABDOMEN, AND PELVIS WITH CONTRAST TECHNIQUE: Multidetector CT imaging of the chest, abdomen and pelvis was performed following the standard protocol during bolus administration of intravenous contrast. RADIATION DOSE REDUCTION: This exam was performed according to the departmental dose-optimization program which includes automated exposure control, adjustment of the mA and/or kV according to patient size and/or use of  iterative reconstruction technique. CONTRAST:  122mL ISOVUE-300 IOPAMIDOL (ISOVUE-300) INJECTION 61%, additional oral enteric contrast COMPARISON:  CT chest abdomen pelvis, 12/24/2021 FINDINGS: CT CHEST FINDINGS Cardiovascular: Aortic atherosclerosis. Normal heart size. Left coronary artery calcifications. No pericardial effusion. Mediastinum/Nodes: No enlarged mediastinal, hilar, or axillary lymph nodes. Thyroid gland, trachea, and esophagus demonstrate no significant findings. Lungs/Pleura: Status post right upper lobectomy and additional wedge resection of the right middle lobe. Unchanged, trace right pleural effusion. Tiny left pulmonary nodules are unchanged, including a 0.2 cm nodule of the peripheral left upper lobe (series 4, image 81) and a 0.3 cm nodule of the superior segment left lower lobe (series 4, image 77). No new nodules. Musculoskeletal: No chest wall abnormality. No acute osseous findings. CT ABDOMEN PELVIS FINDINGS Hepatobiliary: No solid liver abnormality is seen. Faintly rim calcified gallstones. No gallbladder wall thickening, or biliary dilatation. Pancreas: Unremarkable. No pancreatic ductal dilatation or surrounding inflammatory changes. Spleen: Normal in size without significant abnormality. Adrenals/Urinary Tract: Adrenal glands are unremarkable. Kidneys are normal, without renal calculi, solid lesion, or hydronephrosis. Tiny focus of air in the urinary bladder. Stomach/Bowel: Stomach is within normal limits. Appendix appears normal. No evidence of bowel wall thickening, distention, or inflammatory changes. Occasional sigmoid diverticula. Vascular/Lymphatic: Aortic atherosclerosis. No enlarged abdominal or pelvic lymph nodes. Reproductive: Status post hysterectomy. Other: No abdominal wall hernia or abnormality. No ascites. Musculoskeletal: No acute osseous findings. Unchanged, benign lipoma near the insertion of the iliacus on the lesser trochanter (series 2, image 108). IMPRESSION: 1.  Status post right upper lobectomy and additional wedge resection of the right middle lobe. Unchanged, trace right pleural effusion. 2.  Tiny left pulmonary nodules are unchanged, almost certainly benign and incidental. No new nodules. Attention on follow-up. 3. No evidence of lymphadenopathy or metastatic disease in the chest, abdomen or pelvis. 4. Cholelithiasis. 5. Tiny focus of air in the urinary bladder, likely due to recent catheterization. In the absence of catheterization, an alternate explanation for air within the urinary tract is bowel or other fistula, however there are no adjacent inflammatory findings of the bowel or other imaging findings to suggest fistula at this time. 6. Coronary artery disease. Aortic Atherosclerosis (ICD10-I70.0). Electronically Signed   By: Delanna Ahmadi M.D.   On: 02/26/2022 08:44     ASSESSMENT/PLAN:  This is a very pleasant 71 year old African-American female diagnosed with stage IV (T3, N0, M1 B) non-small cell lung cancer, adenocarcinoma.  She presented with a right upper lobe lung mass with a solitary brain metastasis.  She was diagnosed in February 2022.  Her PD-L1 expression is 90%.  She does not have any actionable mutations.   The patient completed SRS followed by craniotomy and resection on 08/23/2020 under the care of Dr. Tammi Klippel and craniotomy under the care of Dr. Zada Finders on 08/24/2020.   She then had a robot-assisted right upper lobectomy with en bloc wedge resection of the right middle lobe and lymph node dissection under the care of Dr. Roxan Hockey on September 24, 2020.   She underwent SRS to the two new subcentimeter brain metastases on 11/29/20.   The patient is currently undergoing systemic chemotherapy with carboplatin for an AUC of 5, Alimta 500 mg per metered squared, Keytruda 200 mg IV every 3 weeks.  She is status post 22 cycles and tolerated it well without any adverse side effects.  Starting from cycle #5, the patient started maintenance Alimta  and Keytruda.    She recently had progessive metastatic disease to the brain and is status post SRS which was completed on 11/18/21.  She also had the LITT brain procedure Dr. Brett Albino at [facility] in April 2023.  Labs were reviewed.  Recommend that she proceed cycle #20 today as scheduled.  We will see her back for follow-up visit in 3 weeks for evaluation and repeat blood work before undergoing cycle #24.  She will continue to follow closely with radiation oncology neurooncology for her history of metastatic disease to the brain.   The patient was advised to call immediately if she has any concerning symptoms in the interval. The patient voices understanding of current disease status and treatment options and is in agreement with the current care plan. All questions were answered. The patient knows to call the clinic with any problems, questions or concerns. We can certainly see the patient much sooner if necessary  No orders of the defined types were placed in this encounter.    I spent {CHL ONC TIME VISIT - XBDZH:[id number]} counseling the patient face to face. The total time spent in the appointment was {CHL ONC TIME VISIT - HDQQI:[id number]}.  Ciela Mahajan L Jhordan Mckibben, PA-C 03/18/22

## 2022-03-20 ENCOUNTER — Other Ambulatory Visit: Payer: Self-pay

## 2022-03-20 ENCOUNTER — Inpatient Hospital Stay: Payer: Medicare HMO

## 2022-03-20 ENCOUNTER — Inpatient Hospital Stay: Payer: Medicare HMO | Attending: Internal Medicine

## 2022-03-20 ENCOUNTER — Inpatient Hospital Stay (HOSPITAL_BASED_OUTPATIENT_CLINIC_OR_DEPARTMENT_OTHER): Payer: Medicare HMO | Admitting: Physician Assistant

## 2022-03-20 ENCOUNTER — Encounter: Payer: Self-pay | Admitting: Internal Medicine

## 2022-03-20 ENCOUNTER — Encounter: Payer: Self-pay | Admitting: Physician Assistant

## 2022-03-20 VITALS — BP 138/76 | HR 85 | Temp 98.2°F | Resp 16

## 2022-03-20 VITALS — BP 141/83 | HR 95 | Temp 98.8°F | Resp 16 | Wt 130.6 lb

## 2022-03-20 DIAGNOSIS — K5730 Diverticulosis of large intestine without perforation or abscess without bleeding: Secondary | ICD-10-CM | POA: Diagnosis not present

## 2022-03-20 DIAGNOSIS — E119 Type 2 diabetes mellitus without complications: Secondary | ICD-10-CM | POA: Diagnosis not present

## 2022-03-20 DIAGNOSIS — Z79899 Other long term (current) drug therapy: Secondary | ICD-10-CM | POA: Insufficient documentation

## 2022-03-20 DIAGNOSIS — Z5111 Encounter for antineoplastic chemotherapy: Secondary | ICD-10-CM | POA: Diagnosis not present

## 2022-03-20 DIAGNOSIS — J90 Pleural effusion, not elsewhere classified: Secondary | ICD-10-CM | POA: Insufficient documentation

## 2022-03-20 DIAGNOSIS — E785 Hyperlipidemia, unspecified: Secondary | ICD-10-CM | POA: Insufficient documentation

## 2022-03-20 DIAGNOSIS — I739 Peripheral vascular disease, unspecified: Secondary | ICD-10-CM | POA: Insufficient documentation

## 2022-03-20 DIAGNOSIS — Z7902 Long term (current) use of antithrombotics/antiplatelets: Secondary | ICD-10-CM | POA: Diagnosis not present

## 2022-03-20 DIAGNOSIS — Z87891 Personal history of nicotine dependence: Secondary | ICD-10-CM | POA: Diagnosis not present

## 2022-03-20 DIAGNOSIS — Z8719 Personal history of other diseases of the digestive system: Secondary | ICD-10-CM | POA: Diagnosis not present

## 2022-03-20 DIAGNOSIS — F419 Anxiety disorder, unspecified: Secondary | ICD-10-CM | POA: Diagnosis not present

## 2022-03-20 DIAGNOSIS — C3411 Malignant neoplasm of upper lobe, right bronchus or lung: Secondary | ICD-10-CM

## 2022-03-20 DIAGNOSIS — R5383 Other fatigue: Secondary | ICD-10-CM | POA: Insufficient documentation

## 2022-03-20 DIAGNOSIS — K8020 Calculus of gallbladder without cholecystitis without obstruction: Secondary | ICD-10-CM | POA: Insufficient documentation

## 2022-03-20 DIAGNOSIS — R634 Abnormal weight loss: Secondary | ICD-10-CM | POA: Diagnosis not present

## 2022-03-20 DIAGNOSIS — K219 Gastro-esophageal reflux disease without esophagitis: Secondary | ICD-10-CM | POA: Diagnosis not present

## 2022-03-20 DIAGNOSIS — I2510 Atherosclerotic heart disease of native coronary artery without angina pectoris: Secondary | ICD-10-CM | POA: Diagnosis not present

## 2022-03-20 DIAGNOSIS — Z902 Acquired absence of lung [part of]: Secondary | ICD-10-CM | POA: Diagnosis not present

## 2022-03-20 DIAGNOSIS — C7931 Secondary malignant neoplasm of brain: Secondary | ICD-10-CM | POA: Insufficient documentation

## 2022-03-20 DIAGNOSIS — Z5112 Encounter for antineoplastic immunotherapy: Secondary | ICD-10-CM | POA: Insufficient documentation

## 2022-03-20 DIAGNOSIS — F32A Depression, unspecified: Secondary | ICD-10-CM | POA: Insufficient documentation

## 2022-03-20 DIAGNOSIS — Z888 Allergy status to other drugs, medicaments and biological substances status: Secondary | ICD-10-CM | POA: Insufficient documentation

## 2022-03-20 DIAGNOSIS — R519 Headache, unspecified: Secondary | ICD-10-CM

## 2022-03-20 DIAGNOSIS — C3491 Malignant neoplasm of unspecified part of right bronchus or lung: Secondary | ICD-10-CM

## 2022-03-20 DIAGNOSIS — I700 Atherosclerosis of aorta: Secondary | ICD-10-CM | POA: Insufficient documentation

## 2022-03-20 LAB — CBC WITH DIFFERENTIAL (CANCER CENTER ONLY)
Abs Immature Granulocytes: 0.03 10*3/uL (ref 0.00–0.07)
Basophils Absolute: 0 10*3/uL (ref 0.0–0.1)
Basophils Relative: 1 %
Eosinophils Absolute: 0 10*3/uL (ref 0.0–0.5)
Eosinophils Relative: 0 %
HCT: 34.7 % — ABNORMAL LOW (ref 36.0–46.0)
Hemoglobin: 11.1 g/dL — ABNORMAL LOW (ref 12.0–15.0)
Immature Granulocytes: 1 %
Lymphocytes Relative: 28 %
Lymphs Abs: 1.5 10*3/uL (ref 0.7–4.0)
MCH: 28.8 pg (ref 26.0–34.0)
MCHC: 32 g/dL (ref 30.0–36.0)
MCV: 89.9 fL (ref 80.0–100.0)
Monocytes Absolute: 0.6 10*3/uL (ref 0.1–1.0)
Monocytes Relative: 12 %
Neutro Abs: 3.1 10*3/uL (ref 1.7–7.7)
Neutrophils Relative %: 58 %
Platelet Count: 318 10*3/uL (ref 150–400)
RBC: 3.86 MIL/uL — ABNORMAL LOW (ref 3.87–5.11)
RDW: 13.2 % (ref 11.5–15.5)
WBC Count: 5.2 10*3/uL (ref 4.0–10.5)
nRBC: 0 % (ref 0.0–0.2)

## 2022-03-20 LAB — CMP (CANCER CENTER ONLY)
ALT: 21 U/L (ref 0–44)
AST: 33 U/L (ref 15–41)
Albumin: 3.7 g/dL (ref 3.5–5.0)
Alkaline Phosphatase: 72 U/L (ref 38–126)
Anion gap: 7 (ref 5–15)
BUN: 8 mg/dL (ref 8–23)
CO2: 29 mmol/L (ref 22–32)
Calcium: 9.5 mg/dL (ref 8.9–10.3)
Chloride: 107 mmol/L (ref 98–111)
Creatinine: 0.86 mg/dL (ref 0.44–1.00)
GFR, Estimated: >60 mL/min (ref >60)
Glucose, Bld: 121 mg/dL — ABNORMAL HIGH (ref 70–99)
Potassium: 4.1 mmol/L (ref 3.5–5.1)
Sodium: 143 mmol/L (ref 135–145)
Total Bilirubin: 0.3 mg/dL (ref 0.3–1.2)
Total Protein: 6.5 g/dL (ref 6.5–8.1)

## 2022-03-20 LAB — TSH: TSH: 0.721 u[IU]/mL (ref 0.350–4.500)

## 2022-03-20 MED ORDER — SODIUM CHLORIDE 0.9 % IV SOLN: Freq: Once | INTRAVENOUS | Status: AC

## 2022-03-20 MED ORDER — PROCHLORPERAZINE MALEATE 10 MG PO TABS
10.0000 mg | ORAL_TABLET | Freq: Once | ORAL | Status: AC
  Administered 2022-03-20: 10 mg via ORAL
  Filled 2022-03-20: qty 1

## 2022-03-20 MED ORDER — SODIUM CHLORIDE 0.9 % IV SOLN
500.0000 mg/m2 | Freq: Once | INTRAVENOUS | Status: AC
  Administered 2022-03-20: 800 mg via INTRAVENOUS
  Filled 2022-03-20: qty 20

## 2022-03-20 MED ORDER — SODIUM CHLORIDE 0.9 % IV SOLN
200.0000 mg | Freq: Once | INTRAVENOUS | Status: AC
  Administered 2022-03-20: 200 mg via INTRAVENOUS
  Filled 2022-03-20: qty 200

## 2022-03-20 MED ORDER — ACETAMINOPHEN 325 MG PO TABS
650.0000 mg | ORAL_TABLET | Freq: Once | ORAL | Status: AC
  Administered 2022-03-20: 650 mg via ORAL
  Filled 2022-03-20: qty 2

## 2022-03-20 NOTE — Progress Notes (Signed)
650mg  of acetaminophen given for a headache- per Cassie, H. PA.

## 2022-03-20 NOTE — Progress Notes (Signed)
Subjective:    Patient ID: Hannah [name], female    DOB: 04/13/1951, 71 y.o.   MRN: [id number]     HPI Hannah [name] is here for follow up of her chronic medical problems, including hld, DM, depression, anxiety, metastatic lung cancer.  She is here with her daughter.  Started back on the mirtazapine for appetite and sleep.  She is eating more and resting better.    Medications and allergies reviewed with patient and updated if appropriate.  Current Outpatient Medications on File Prior to Visit  Medication Sig Dispense Refill   acetaminophen (TYLENOL) 325 MG tablet Take 325 mg by mouth every 6 (six) hours as needed for moderate pain.     ALPRAZolam (XANAX XR) 0.5 MG 24 hr tablet Take 1 tablet (0.5 mg total) by mouth daily. 30 tablet 5   aspirin 81 MG tablet Take 1 tablet (81 mg total) by mouth daily. Restart on 08/31/20 30 tablet    bisacodyl (DULCOLAX) 5 MG EC tablet Take 5 mg by mouth daily as needed for moderate constipation.     carboxymethylcellulose (REFRESH PLUS) 0.5 % SOLN Place 1-2 drops into both eyes 3 (three) times daily as needed (dry eyes).     Cholecalciferol (D3 PO) Take 1 capsule by mouth daily.     cilostazol (PLETAL) 100 MG tablet Take 1 tablet (100 mg total) by mouth 2 (two) times daily before a meal. 60 tablet 11   citalopram (CELEXA) 10 MG tablet Take 1 tablet (10 mg total) by mouth daily. 90 tablet 1   fluticasone (FLONASE) 50 MCG/ACT nasal spray PLACE 2 SPRAYS INTO BOTH NOSTRILS DAILY. (Patient taking differently: Place 2 sprays into both nostrils daily as needed for allergies.) 48 g 1   folic acid (FOLVITE) 1 MG tablet Take 1 tablet (1 mg total) by mouth daily. 30 tablet 3   Lacosamide 100 MG TABS Take 1 tablet (100 mg total) by mouth 2 (two) times daily. 60 tablet 3   levETIRAcetam (KEPPRA) 100 MG/ML solution TAKE 15 ML BY MOUTH TWICE DAILY 473 mL 0   loratadine (CLARITIN) 10 MG tablet Take 10 mg by mouth daily as needed for allergies.     magnesium hydroxide  (MILK OF MAGNESIA) 800 MG/5ML suspension Take 30 mLs by mouth daily as needed for constipation.     Menthol, Topical Analgesic, (BIOFREEZE EX) Apply 1 application topically as needed (pain).     olopatadine (PATANOL) 0.1 % ophthalmic solution Place 1 drop into both eyes 2 (two) times daily as needed for allergies.     omeprazole (PRILOSEC) 40 MG capsule TAKE 1 CAPSULE TWICE DAILY BEFORE MEALS (Patient taking differently: Take 40 mg by mouth daily.) 180 capsule 0   polyethylene glycol (MIRALAX / GLYCOLAX) 17 g packet Take 17 g by mouth 2 (two) times daily. 72 each 0   Probiotic Product (PROBIOTIC DAILY PO) Take 1 capsule by mouth daily.     prochlorperazine (COMPAZINE) 10 MG tablet TAKE 1 TABLET BY MOUTH EVERY 6 HOURS AS NEEDED FOR NAUSEA OR VOMITING 30 tablet 0   simethicone (MYLICON) 80 MG chewable tablet Chew 1 tablet (80 mg total) by mouth 4 (four) times daily as needed for flatulence (Bloating). 30 tablet 0   No current facility-administered medications on file prior to visit.     Review of Systems  Constitutional:  Negative for chills and fever.  HENT:  Positive for congestion.   Respiratory:  Negative for cough, shortness of breath and wheezing.  Coughed up blood this morning - very small amount  Cardiovascular:  Positive for palpitations (occ) and leg swelling (mild). Negative for chest pain.  Neurological:  Positive for light-headedness (occ). Negative for headaches.       Objective:   Vitals:   03/21/22 0918  BP: 138/78  Pulse: 76  Temp: 98.2 F (36.8 C)  SpO2: 98%   BP Readings from Last 3 Encounters:  03/21/22 138/78  03/20/22 138/76  03/20/22 (!) 141/83   Wt Readings from Last 3 Encounters:  03/21/22 132 lb (59.9 kg)  03/20/22 130 lb 9.6 oz (59.2 kg)  03/11/22 129 lb (58.5 kg)   Body mass index is 25.78 kg/m.    Physical Exam Constitutional:      General: She is not in acute distress.    Appearance: Normal appearance.  HENT:     Head:  Normocephalic and atraumatic.  Eyes:     Conjunctiva/sclera: Conjunctivae normal.  Cardiovascular:     Rate and Rhythm: Normal rate and regular rhythm.     Heart sounds: Normal heart sounds. No murmur heard. Pulmonary:     Effort: Pulmonary effort is normal. No respiratory distress.     Breath sounds: Normal breath sounds. No wheezing.  Musculoskeletal:     Cervical back: Neck supple.     Right lower leg: Edema (trace) present.     Left lower leg: Edema (trace) present.  Lymphadenopathy:     Cervical: No cervical adenopathy.  Skin:    General: Skin is warm and dry.     Findings: No rash.  Neurological:     Mental Status: She is alert. Mental status is at baseline.  Psychiatric:        Mood and Affect: Mood normal.        Behavior: Behavior normal.        Lab Results  Component Value Date   WBC 5.2 03/20/2022   HGB 11.1 (L) 03/20/2022   HCT 34.7 (L) 03/20/2022   PLT 318 03/20/2022   GLUCOSE 121 (H) 03/20/2022   CHOL 171 03/20/2021   TRIG 286.0 (H) 03/20/2021   HDL 46.90 03/20/2021   LDLDIRECT 73.0 03/20/2021   LDLCALC 62 03/14/2020   ALT 21 03/20/2022   AST 33 03/20/2022   NA 143 03/20/2022   K 4.1 03/20/2022   CL 107 03/20/2022   CREATININE 0.86 03/20/2022   BUN 8 03/20/2022   CO2 29 03/20/2022   TSH 0.721 03/20/2022   INR 1.0 02/12/2021   HGBA1C 6.2 (A) 09/18/2021   HGBA1C 6.2 09/18/2021   HGBA1C 6.2 09/18/2021   HGBA1C 6.2 09/18/2021   MICROALBUR <0.7 03/20/2021     Assessment & Plan:    See Problem List for Assessment and Plan of chronic medical problems.

## 2022-03-20 NOTE — Patient Instructions (Addendum)
    Depo-medrol ( steroid injection) given today   Blood work was ordered.     Medications changes include :   none   Your prescription(s) have been sent to your pharmacy.     Return in about 6 months (around 09/19/2022) for follow up.

## 2022-03-21 ENCOUNTER — Encounter: Payer: Self-pay | Admitting: Internal Medicine

## 2022-03-21 ENCOUNTER — Ambulatory Visit (INDEPENDENT_AMBULATORY_CARE_PROVIDER_SITE_OTHER): Payer: Medicare HMO | Admitting: Internal Medicine

## 2022-03-21 ENCOUNTER — Other Ambulatory Visit: Payer: Self-pay

## 2022-03-21 VITALS — BP 138/78 | HR 76 | Temp 98.2°F | Wt 132.0 lb

## 2022-03-21 DIAGNOSIS — F419 Anxiety disorder, unspecified: Secondary | ICD-10-CM | POA: Diagnosis not present

## 2022-03-21 DIAGNOSIS — E119 Type 2 diabetes mellitus without complications: Secondary | ICD-10-CM | POA: Diagnosis not present

## 2022-03-21 DIAGNOSIS — E782 Mixed hyperlipidemia: Secondary | ICD-10-CM

## 2022-03-21 DIAGNOSIS — K219 Gastro-esophageal reflux disease without esophagitis: Secondary | ICD-10-CM

## 2022-03-21 DIAGNOSIS — R0981 Nasal congestion: Secondary | ICD-10-CM

## 2022-03-21 DIAGNOSIS — F3289 Other specified depressive episodes: Secondary | ICD-10-CM

## 2022-03-21 LAB — LIPID PANEL
Cholesterol: 185 mg/dL (ref 0–200)
HDL: 52.3 mg/dL (ref >39.00)
NonHDL: 132.51
Total CHOL/HDL Ratio: 4
Triglycerides: 212 mg/dL — ABNORMAL HIGH (ref 0.0–149.0)
VLDL: 42.4 mg/dL — ABNORMAL HIGH (ref 0.0–40.0)

## 2022-03-21 LAB — COMPREHENSIVE METABOLIC PANEL
ALT: 21 U/L (ref 0–35)
AST: 33 U/L (ref 0–37)
Albumin: 3.6 g/dL (ref 3.5–5.2)
Alkaline Phosphatase: 77 U/L (ref 39–117)
BUN: 10 mg/dL (ref 6–23)
CO2: 28 mEq/L (ref 19–32)
Calcium: 9.6 mg/dL (ref 8.4–10.5)
Chloride: 108 mEq/L (ref 96–112)
Creatinine, Ser: 0.76 mg/dL (ref 0.40–1.20)
GFR: 77.63 mL/min (ref >60.00)
Glucose, Bld: 108 mg/dL — ABNORMAL HIGH (ref 70–99)
Potassium: 3.8 mEq/L (ref 3.5–5.1)
Sodium: 142 mEq/L (ref 135–145)
Total Bilirubin: 0.2 mg/dL (ref 0.2–1.2)
Total Protein: 6.6 g/dL (ref 6.0–8.3)

## 2022-03-21 LAB — MICROALBUMIN / CREATININE URINE RATIO
Creatinine,U: 88.3 mg/dL
Microalb Creat Ratio: 0.8 mg/g (ref 0.0–30.0)
Microalb, Ur: <0.7 mg/dL (ref 0.0–1.9)

## 2022-03-21 LAB — LDL CHOLESTEROL, DIRECT: Direct LDL: 89 mg/dL

## 2022-03-21 LAB — T4: T4, Total: 7.4 ug/dL (ref 4.5–12.0)

## 2022-03-21 MED ORDER — ALPRAZOLAM 0.5 MG PO TABS: 0.5000 mg | ORAL_TABLET | Freq: Three times a day (TID) | ORAL | 0 refills | Status: DC | PRN

## 2022-03-21 MED ORDER — METHYLPREDNISOLONE ACETATE 40 MG/ML IJ SUSP
40.0000 mg | Freq: Once | INTRAMUSCULAR | Status: AC
  Administered 2022-03-21: 40 mg via INTRAMUSCULAR

## 2022-03-21 MED ORDER — MIRTAZAPINE 15 MG PO TABS: 7.5000 mg | ORAL_TABLET | Freq: Every day | ORAL | 2 refills | Status: DC

## 2022-03-21 MED ORDER — PRAVASTATIN SODIUM 40 MG PO TABS: 40.0000 mg | ORAL_TABLET | Freq: Every evening | ORAL | 3 refills | Status: DC

## 2022-03-21 NOTE — Assessment & Plan Note (Addendum)
Acute Related to recent viral illness  - not improving Discussed otc meds, things that can be used Depo-medrol 40 mg IM x 1

## 2022-03-21 NOTE — Assessment & Plan Note (Signed)
Chronic Regular exercise and healthy diet encouraged Check lipid panel  Continue pravastatin 40 mg daily

## 2022-03-21 NOTE — Assessment & Plan Note (Addendum)
Chronic Controlled, Stable Continue Celexa 10 mg daily, alprazolam xr 0.5 mg daily in am, alprazolam 0.5 mg daily as needed, remeron 7.5 mg HS

## 2022-03-21 NOTE — Assessment & Plan Note (Signed)
Chronic  Lab Results  Component Value Date   HGBA1C 6.2 (A) 09/18/2021   HGBA1C 6.2 09/18/2021   HGBA1C 6.2 09/18/2021   HGBA1C 6.2 09/18/2021   Sugars well controlled Check A1c, urine microalbumin today Continue lifestyle control Stressed regular exercise, diabetic diet

## 2022-03-21 NOTE — Assessment & Plan Note (Addendum)
Chronic Controlled, Stable Continue Celexa 10 mg daily, mirtazapine 7.5 mg HS

## 2022-03-22 ENCOUNTER — Other Ambulatory Visit: Payer: Self-pay

## 2022-03-24 LAB — HEMOGLOBIN A1C: Hgb A1c MFr Bld: 6.4 % (ref 4.6–6.5)

## 2022-03-26 ENCOUNTER — Other Ambulatory Visit: Payer: Self-pay | Admitting: Internal Medicine

## 2022-03-27 ENCOUNTER — Encounter: Payer: Self-pay | Admitting: Internal Medicine

## 2022-04-09 ENCOUNTER — Other Ambulatory Visit: Payer: Self-pay | Admitting: Internal Medicine

## 2022-04-10 ENCOUNTER — Inpatient Hospital Stay: Payer: Medicare HMO

## 2022-04-10 ENCOUNTER — Other Ambulatory Visit: Payer: Self-pay

## 2022-04-10 ENCOUNTER — Ambulatory Visit: Payer: Medicare HMO | Admitting: Internal Medicine

## 2022-04-10 ENCOUNTER — Ambulatory Visit: Payer: Medicare HMO

## 2022-04-10 ENCOUNTER — Encounter: Payer: Self-pay | Admitting: Internal Medicine

## 2022-04-10 ENCOUNTER — Inpatient Hospital Stay: Payer: Medicare HMO | Attending: Internal Medicine | Admitting: Internal Medicine

## 2022-04-10 ENCOUNTER — Other Ambulatory Visit: Payer: Medicare HMO

## 2022-04-10 VITALS — BP 135/66 | HR 68 | Temp 97.6°F | Resp 18 | Wt 130.2 lb

## 2022-04-10 VITALS — BP 135/75 | HR 71 | Temp 98.4°F | Resp 18

## 2022-04-10 DIAGNOSIS — C3411 Malignant neoplasm of upper lobe, right bronchus or lung: Secondary | ICD-10-CM

## 2022-04-10 DIAGNOSIS — Z7902 Long term (current) use of antithrombotics/antiplatelets: Secondary | ICD-10-CM | POA: Insufficient documentation

## 2022-04-10 DIAGNOSIS — Z5112 Encounter for antineoplastic immunotherapy: Secondary | ICD-10-CM | POA: Diagnosis not present

## 2022-04-10 DIAGNOSIS — K219 Gastro-esophageal reflux disease without esophagitis: Secondary | ICD-10-CM | POA: Insufficient documentation

## 2022-04-10 DIAGNOSIS — F419 Anxiety disorder, unspecified: Secondary | ICD-10-CM | POA: Insufficient documentation

## 2022-04-10 DIAGNOSIS — Z87891 Personal history of nicotine dependence: Secondary | ICD-10-CM | POA: Diagnosis not present

## 2022-04-10 DIAGNOSIS — Z888 Allergy status to other drugs, medicaments and biological substances status: Secondary | ICD-10-CM | POA: Diagnosis not present

## 2022-04-10 DIAGNOSIS — C7931 Secondary malignant neoplasm of brain: Secondary | ICD-10-CM | POA: Insufficient documentation

## 2022-04-10 DIAGNOSIS — Z5111 Encounter for antineoplastic chemotherapy: Secondary | ICD-10-CM | POA: Diagnosis not present

## 2022-04-10 DIAGNOSIS — Z8719 Personal history of other diseases of the digestive system: Secondary | ICD-10-CM | POA: Insufficient documentation

## 2022-04-10 DIAGNOSIS — Z79899 Other long term (current) drug therapy: Secondary | ICD-10-CM | POA: Diagnosis not present

## 2022-04-10 DIAGNOSIS — F32A Depression, unspecified: Secondary | ICD-10-CM | POA: Insufficient documentation

## 2022-04-10 DIAGNOSIS — K5900 Constipation, unspecified: Secondary | ICD-10-CM | POA: Insufficient documentation

## 2022-04-10 DIAGNOSIS — I739 Peripheral vascular disease, unspecified: Secondary | ICD-10-CM | POA: Insufficient documentation

## 2022-04-10 DIAGNOSIS — E785 Hyperlipidemia, unspecified: Secondary | ICD-10-CM | POA: Insufficient documentation

## 2022-04-10 DIAGNOSIS — C3490 Malignant neoplasm of unspecified part of unspecified bronchus or lung: Secondary | ICD-10-CM | POA: Diagnosis not present

## 2022-04-10 LAB — CMP (CANCER CENTER ONLY)
ALT: 19 U/L (ref 0–44)
AST: 27 U/L (ref 15–41)
Albumin: 3.7 g/dL (ref 3.5–5.0)
Alkaline Phosphatase: 80 U/L (ref 38–126)
Anion gap: 4 — ABNORMAL LOW (ref 5–15)
BUN: 12 mg/dL (ref 8–23)
CO2: 29 mmol/L (ref 22–32)
Calcium: 9.3 mg/dL (ref 8.9–10.3)
Chloride: 108 mmol/L (ref 98–111)
Creatinine: 0.75 mg/dL (ref 0.44–1.00)
GFR, Estimated: >60 mL/min (ref >60)
Glucose, Bld: 138 mg/dL — ABNORMAL HIGH (ref 70–99)
Potassium: 4 mmol/L (ref 3.5–5.1)
Sodium: 141 mmol/L (ref 135–145)
Total Bilirubin: 0.3 mg/dL (ref 0.3–1.2)
Total Protein: 6.6 g/dL (ref 6.5–8.1)

## 2022-04-10 LAB — CBC WITH DIFFERENTIAL (CANCER CENTER ONLY)
Abs Immature Granulocytes: 0.01 10*3/uL (ref 0.00–0.07)
Basophils Absolute: 0 10*3/uL (ref 0.0–0.1)
Basophils Relative: 1 %
Eosinophils Absolute: 0 10*3/uL (ref 0.0–0.5)
Eosinophils Relative: 1 %
HCT: 34 % — ABNORMAL LOW (ref 36.0–46.0)
Hemoglobin: 10.9 g/dL — ABNORMAL LOW (ref 12.0–15.0)
Immature Granulocytes: 0 %
Lymphocytes Relative: 35 %
Lymphs Abs: 1.5 10*3/uL (ref 0.7–4.0)
MCH: 28.8 pg (ref 26.0–34.0)
MCHC: 32.1 g/dL (ref 30.0–36.0)
MCV: 89.7 fL (ref 80.0–100.0)
Monocytes Absolute: 0.5 10*3/uL (ref 0.1–1.0)
Monocytes Relative: 12 %
Neutro Abs: 2.2 10*3/uL (ref 1.7–7.7)
Neutrophils Relative %: 51 %
Platelet Count: 259 10*3/uL (ref 150–400)
RBC: 3.79 MIL/uL — ABNORMAL LOW (ref 3.87–5.11)
RDW: 13.1 % (ref 11.5–15.5)
WBC Count: 4.2 10*3/uL (ref 4.0–10.5)
nRBC: 0 % (ref 0.0–0.2)

## 2022-04-10 LAB — TSH: TSH: 0.841 u[IU]/mL (ref 0.350–4.500)

## 2022-04-10 MED ORDER — CYANOCOBALAMIN 1000 MCG/ML IJ SOLN
1000.0000 ug | Freq: Once | INTRAMUSCULAR | Status: AC
  Administered 2022-04-10: 1000 ug via INTRAMUSCULAR
  Filled 2022-04-10: qty 1

## 2022-04-10 MED ORDER — PROCHLORPERAZINE MALEATE 10 MG PO TABS
10.0000 mg | ORAL_TABLET | Freq: Once | ORAL | Status: AC
  Administered 2022-04-10: 10 mg via ORAL
  Filled 2022-04-10: qty 1

## 2022-04-10 MED ORDER — SODIUM CHLORIDE 0.9 % IV SOLN
500.0000 mg/m2 | Freq: Once | INTRAVENOUS | Status: AC
  Administered 2022-04-10: 800 mg via INTRAVENOUS
  Filled 2022-04-10: qty 20

## 2022-04-10 MED ORDER — SODIUM CHLORIDE 0.9 % IV SOLN: Freq: Once | INTRAVENOUS | Status: AC

## 2022-04-10 MED ORDER — SODIUM CHLORIDE 0.9 % IV SOLN
200.0000 mg | Freq: Once | INTRAVENOUS | Status: AC
  Administered 2022-04-10: 200 mg via INTRAVENOUS
  Filled 2022-04-10: qty 200

## 2022-04-10 NOTE — Patient Instructions (Signed)
[facility]  Discharge Instructions: Thank you for choosing [facility] to provide your oncology and hematology care.   If you have a lab appointment with the [facility], please go directly to the [facility] and check in at the registration area.   Wear comfortable clothing and clothing appropriate for easy access to any Portacath or PICC line.   We strive to give you quality time with your provider. You may need to reschedule your appointment if you arrive late (15 or more minutes).  Arriving late affects you and other patients whose appointments are after yours.  Also, if you miss three or more appointments without notifying the office, you may be dismissed from the clinic at the provider's discretion.      For prescription refill requests, have your pharmacy contact our office and allow 72 hours for refills to be completed.    Today you received the following chemotherapy and/or immunotherapy agents: Keytruda/Alimta      To help prevent nausea and vomiting after your treatment, we encourage you to take your nausea medication as directed.  BELOW ARE SYMPTOMS THAT SHOULD BE REPORTED IMMEDIATELY: *FEVER GREATER THAN 100.4 F (38 C) OR HIGHER *CHILLS OR SWEATING *NAUSEA AND VOMITING THAT IS NOT CONTROLLED WITH YOUR NAUSEA MEDICATION *UNUSUAL SHORTNESS OF BREATH *UNUSUAL BRUISING OR BLEEDING *URINARY PROBLEMS (pain or burning when urinating, or frequent urination) *BOWEL PROBLEMS (unusual diarrhea, constipation, pain near the anus) TENDERNESS IN MOUTH AND THROAT WITH OR WITHOUT PRESENCE OF ULCERS (sore throat, sores in mouth, or a toothache) UNUSUAL RASH, SWELLING OR PAIN  UNUSUAL VAGINAL DISCHARGE OR ITCHING   Items with * indicate a potential emergency and should be followed up as soon as possible or go to the Emergency Department if any problems should occur.  Please show the CHEMOTHERAPY ALERT CARD or IMMUNOTHERAPY ALERT CARD at  check-in to the Emergency Department and triage nurse.  Should you have questions after your visit or need to cancel or reschedule your appointment, please contact [facility]  Dept: [phone]  and follow the prompts.  Office hours are 8:00 a.m. to 4:30 p.m. Monday - Friday. Please note that voicemails left after 4:00 p.m. may not be returned until the following business day.  We are closed weekends and major holidays. You have access to a nurse at all times for urgent questions. Please call the main number to the clinic Dept: [phone] and follow the prompts.   For any non-urgent questions, you may also contact your provider using MyChart. We now offer e-Visits for anyone 28 and older to request care online for non-urgent symptoms. For details visit [email or web address].   Also download the MyChart app! Go to the app store, search "MyChart", open the app, select [facility], and log in with your MyChart username and password.  Masks are optional in the cancer centers. If you would like for your care team to wear a mask while they are taking care of you, please let them know. You may have one support person who is at least 73 years old accompany you for your appointments.

## 2022-04-10 NOTE — Progress Notes (Signed)
[facility] [phone]   [phone]  OFFICE PROGRESS NOTE  [name], MD [place] [address]  DIAGNOSIS: Stage IV non-small cell lung cancer (T3, N0, M1C) adenocarcinoma.  The patient presented with a and solitary brain metastasis.  The patient was diagnosed in February 2022.  Biomarker Findings Microsatellite status - MS-Stable Tumor Mutational Burden - 8 Muts/Mb Genomic Findings For a complete list of the genes assayed, please refer to the Appendix. KRAS G12V KEAP1 S224F TP53 P151T 7 Disease relevant genes with no reportable alterations: ALK, BRAF, EGFR, ERBB2, MET, RET, ROS1  PDL1 Expression: 50%    PRIOR THERAPY: 1) SRS to the metastatic brain lesion on 08/23/2020 under the care of Dr. Tammi Klippel and craniotomy under the care of Dr. Zada Finders scheduled for 08/24/2020. 2) S/p robotic assisted right upper lobectomy with en bloc wedge resection of the right middle lobe and lymph node dissection under the care of Dr. Roxan Hockey on September 24, 2020. 3) status post SRS to a new subcentimeter brain lesions under the care of Dr. Lisbeth Renshaw. 4) SRS to brain metastasis.   CURRENT THERAPY: Systemic chemotherapy with carboplatin for AUC of 5, Alimta 500 Mg/M2 and Keytruda 200 mg IV every 3 weeks.  First dose Nov 07, 2020.  Status post 23 cycles.  Starting from cycle #5 the patient will be on maintenance treatment with Alimta and Keytruda every 3 weeks.   INTERVAL HISTORY: DAJUANA PALEN 71 y.o. female returns to the clinic today for follow-up visit accompanied by her sister-in-law.  Her daughter was available by FaceTime during the visit.  The patient is feeling fine today with no concerning complaints.  She denied having any current chest pain, shortness of breath, cough or hemoptysis.  She has no nausea, vomiting, diarrhea but has occasional constipation and uses MiraLAX and milk of magnesia on as-needed basis.  She has no recent  weight loss or night sweats.  She has no headache or visual changes.  She is here today for evaluation before starting cycle #24 of her treatment with maintenance Alimta and Keytruda.  MEDICAL HISTORY: Past Medical History:  Diagnosis Date   Allergy    Anemia    Anxiety    Arthritis    Carpal tunnel syndrome    Constipation    senna C stool softeners help    Depression    Diverticulosis    Dyslipidemia    External hemorrhoids    GERD (gastroesophageal reflux disease)    Heart murmur    mild-moderate AR   Hiatal hernia    Hyperlipidemia    on meds    Hypertension    Internal hemorrhoids    lung ca with brain mets 07/2020   Osteoarthritis    Pre-diabetes    PVD (peripheral vascular disease) (HCC)    moderate carotid disease   RBBB    Smoker    Vocal cord polyps     ALLERGIES:  is allergic to amlodipine, chantix [varenicline tartrate], clarithromycin, lisinopril, simvastatin, wellbutrin [bupropion hcl], lipitor [atorvastatin], and sertraline.  MEDICATIONS:  Current Outpatient Medications  Medication Sig Dispense Refill   acetaminophen (TYLENOL) 325 MG tablet Take 325 mg by mouth every 6 (six) hours as needed for moderate pain.     ALPRAZolam (XANAX XR) 0.5 MG 24 hr tablet Take 1 tablet (0.5 mg total) by mouth daily. 30 tablet 5   ALPRAZolam (XANAX) 0.5 MG tablet Take 1 tablet (0.5 mg total) by mouth  3 (three) times daily as needed. for anxiety 90 tablet 0   aspirin 81 MG tablet Take 1 tablet (81 mg total) by mouth daily. Restart on 08/31/20 30 tablet    bisacodyl (DULCOLAX) 5 MG EC tablet Take 5 mg by mouth daily as needed for moderate constipation.     carboxymethylcellulose (REFRESH PLUS) 0.5 % SOLN Place 1-2 drops into both eyes 3 (three) times daily as needed (dry eyes).     Cholecalciferol (D3 PO) Take 1 capsule by mouth daily.     cilostazol (PLETAL) 100 MG tablet Take 1 tablet (100 mg total) by mouth 2 (two) times daily before a meal. 60 tablet 11   citalopram  (CELEXA) 10 MG tablet Take 1 tablet by mouth once daily 90 tablet 0   fluticasone (FLONASE) 50 MCG/ACT nasal spray PLACE 2 SPRAYS INTO BOTH NOSTRILS DAILY. (Patient taking differently: Place 2 sprays into both nostrils daily as needed for allergies.) 48 g 1   folic acid (FOLVITE) 1 MG tablet Take 1 tablet (1 mg total) by mouth daily. 30 tablet 3   Lacosamide 100 MG TABS Take 1 tablet (100 mg total) by mouth 2 (two) times daily. 60 tablet 3   levETIRAcetam (KEPPRA) 100 MG/ML solution TAKE 15 ML BY MOUTH  TWICE DAILY 473 mL 0   loratadine (CLARITIN) 10 MG tablet Take 10 mg by mouth daily as needed for allergies.     magnesium hydroxide (MILK OF MAGNESIA) 800 MG/5ML suspension Take 30 mLs by mouth daily as needed for constipation.     Menthol, Topical Analgesic, (BIOFREEZE EX) Apply 1 application topically as needed (pain).     mirtazapine (REMERON) 15 MG tablet Take 0.5 tablets (7.5 mg total) by mouth at bedtime. 90 tablet 2   olopatadine (PATANOL) 0.1 % ophthalmic solution Place 1 drop into both eyes 2 (two) times daily as needed for allergies.     omeprazole (PRILOSEC) 40 MG capsule TAKE 1 CAPSULE TWICE DAILY BEFORE MEALS (Patient taking differently: Take 40 mg by mouth daily.) 180 capsule 0   polyethylene glycol (MIRALAX / GLYCOLAX) 17 g packet Take 17 g by mouth 2 (two) times daily. 72 each 0   pravastatin (PRAVACHOL) 40 MG tablet Take 1 tablet (40 mg total) by mouth every evening. 90 tablet 3   Probiotic Product (PROBIOTIC DAILY PO) Take 1 capsule by mouth daily.     prochlorperazine (COMPAZINE) 10 MG tablet TAKE 1 TABLET BY MOUTH EVERY 6 HOURS AS NEEDED FOR NAUSEA OR VOMITING 30 tablet 0   simethicone (MYLICON) 80 MG chewable tablet Chew 1 tablet (80 mg total) by mouth 4 (four) times daily as needed for flatulence (Bloating). 30 tablet 0   No current facility-administered medications for this visit.    SURGICAL HISTORY:  Past Surgical History:  Procedure Laterality Date   ABDOMINAL  HYSTERECTOMY  1994   APPLICATION OF CRANIAL NAVIGATION N/A 08/24/2020   Procedure: APPLICATION OF CRANIAL NAVIGATION;  Surgeon: Jadene Pierini, MD;  Location: [facility] OR;  Service: Neurosurgery;  Laterality: N/A;   COLONOSCOPY     COLONOSCOPY W/ POLYPECTOMY  2009   CRANIOTOMY Left 08/24/2020   Procedure: Left Craniotomy for Tumor Resection with Brainlab;  Surgeon: Jadene Pierini, MD;  Location: [facility] OR;  Service: Neurosurgery;  Laterality: Left;   HEMORRHOID SURGERY     INTERCOSTAL NERVE BLOCK Right 09/24/2020   Procedure: INTERCOSTAL NERVE BLOCK;  Surgeon: Loreli Slot, MD;  Location: [facility] OR;  Service: Thoracic;  Laterality: Right;  JOINT REPLACEMENT     LYMPH NODE DISSECTION Right 09/24/2020   Procedure: LYMPH NODE DISSECTION;  Surgeon: Melrose Nakayama, MD;  Location: [facility];  Service: Thoracic;  Laterality: Right;   POLYPECTOMY     right total knee arthroplasty     Dr. Emeterio Reeve 06-04-18   SPINE SURGERY  08/16/2009   TOTAL KNEE ARTHROPLASTY Left 02/13/2014   Procedure: TOTAL KNEE ARTHROPLASTY;  Surgeon: Alta Corning, MD;  Location: [facility];  Service: Orthopedics;  Laterality: Left;   TOTAL KNEE ARTHROPLASTY Right 06/04/2018   Procedure: RIGHT TOTAL KNEE ARTHROPLASTY;  Surgeon: Dorna Leitz, MD;  Location: [facility] ORS;  Service: Orthopedics;  Laterality: Right;  Adductor Block   TUBAL LIGATION      REVIEW OF SYSTEMS:  A comprehensive review of systems was negative except for: Gastrointestinal: positive for constipation   PHYSICAL EXAMINATION: General appearance: alert, cooperative, and no distress Head: Normocephalic, without obvious abnormality, atraumatic Neck: no adenopathy, no JVD, supple, symmetrical, trachea midline, and thyroid not enlarged, symmetric, no tenderness/mass/nodules Lymph nodes: Cervical, supraclavicular, and axillary nodes normal. Resp: clear to auscultation bilaterally Back: symmetric, no curvature. ROM normal. No CVA tenderness. Cardio: regular rate and  rhythm, S1, S2 normal, no murmur, click, rub or gallop GI: soft, non-tender; bowel sounds normal; no masses,  no organomegaly Extremities: extremities normal, atraumatic, no cyanosis or edema  ECOG PERFORMANCE STATUS: 1 - Symptomatic but completely ambulatory  Blood pressure 135/66, pulse 68, temperature 97.6 F (36.4 C), temperature source Tympanic, resp. rate 18, weight 130 lb 3 oz (59.1 kg), SpO2 100 %.  LABORATORY DATA: Lab Results  Component Value Date   WBC 4.2 04/10/2022   HGB 10.9 (L) 04/10/2022   HCT 34.0 (L) 04/10/2022   MCV 89.7 04/10/2022   PLT 259 04/10/2022      Chemistry      Component Value Date/Time   NA 142 03/21/2022 1000   K 3.8 03/21/2022 1000   CL 108 03/21/2022 1000   CO2 28 03/21/2022 1000   BUN 10 03/21/2022 1000   CREATININE 0.76 03/21/2022 1000   CREATININE 0.86 03/20/2022 0926   CREATININE 0.79 03/14/2020 1016      Component Value Date/Time   CALCIUM 9.6 03/21/2022 1000   ALKPHOS 77 03/21/2022 1000   AST 33 03/21/2022 1000   AST 33 03/20/2022 0926   ALT 21 03/21/2022 1000   ALT 21 03/20/2022 0926   BILITOT 0.2 03/21/2022 1000   BILITOT 0.3 03/20/2022 0926       RADIOGRAPHIC STUDIES: No results found.   ASSESSMENT AND PLAN: This is a very pleasant 71 years old African-American female diagnosed with stage IV (T3, N0, M1b) non-small cell lung cancer, adenocarcinoma presented with right upper lobe lung mass with solitary brain metastasis diagnosed in February 2022 status post SRS to the brain lesion followed by craniotomy and resection. The patient also has a solitary lung mass. S/p robotic assisted right upper lobectomy with en bloc wedge resection of the right middle lobe and lymph node dissection under the care of Dr. Roxan Hockey on September 24, 2020. She also underwent SRS treatment to a new subcentimeter brain lesions under the care of Dr. Lisbeth Renshaw She is currently undergoing systemic chemotherapy with carboplatin for AUC of 5, Alimta 500  Mg/M2 and Keytruda 200 Mg IV every 3 weeks status post 23 cycles.  Starting from cycle #5 the patient will be on maintenance treatment with Alimta and Keytruda every 3 weeks. The patient continues to tolerate this treatment well with  no concerning adverse effects. I recommended for her to proceed with cycle #24 today as planned. I will see her back for follow-up visit in 3 weeks for evaluation with repeat CT scan of the chest, abdomen and pelvis for restaging of her disease. For the brain metastasis, she is followed by radiation oncology and neuro-oncology. The patient was advised to call immediately if she has any other concerning symptoms in the interval. The patient voices understanding of current disease status and treatment options and is in agreement with the current care plan.  All questions were answered. The patient knows to call the clinic with any problems, questions or concerns. We can certainly see the patient much sooner if necessary.  Disclaimer: This note was dictated with voice recognition software. Similar sounding words can inadvertently be transcribed and may not be corrected upon review.

## 2022-04-11 LAB — T4: T4, Total: 7.7 ug/dL (ref 4.5–12.0)

## 2022-04-14 ENCOUNTER — Ambulatory Visit (INDEPENDENT_AMBULATORY_CARE_PROVIDER_SITE_OTHER): Payer: Medicare HMO

## 2022-04-14 VITALS — Ht 61.0 in | Wt 130.3 lb

## 2022-04-14 DIAGNOSIS — Z0000 Encounter for general adult medical examination without abnormal findings: Secondary | ICD-10-CM | POA: Diagnosis not present

## 2022-04-14 NOTE — Progress Notes (Signed)
Virtual Visit via Telephone Note  I connected with  Hannah [name] on 04/14/22 at  8:45 AM EDT by telephone and verified that I am speaking with the correct person using two identifiers.  Location: Patient: Home Provider: [facility] participating in the virtual visit: [place]   I discussed the limitations, risks, security and privacy concerns of performing an evaluation and management service by telephone and the availability of in person appointments. The patient expressed understanding and agreed to proceed.  Interactive audio and video telecommunications were attempted between this nurse and patient, however failed, due to patient having technical difficulties OR patient did not have access to video capability.  We continued and completed visit with audio only.  Some vital signs may be absent or patient reported.   Sheral Flow, LPN  Subjective:   Hannah [name] is a 71 y.o. female who presents for Medicare Annual (Subsequent) preventive examination.  Review of Systems     Cardiac Risk Factors include: advanced age (>25men, >20 women);dyslipidemia;family history of premature cardiovascular disease     Objective:    Today's Vitals   04/14/22 0850  Weight: 130 lb 4.7 oz (59.1 kg)  Height: 5\' 1"  (1.549 m)  PainSc: 0-No pain   Body mass index is 24.62 kg/m.     04/14/2022    8:51 AM 04/10/2022   10:24 AM 02/06/2022    9:40 AM 12/18/2021    4:40 PM 12/05/2021   10:24 AM 10/27/2021   10:35 AM 10/24/2021   11:23 AM  Advanced Directives  Does Patient Have a Medical Advance Directive? Yes No No No No No No  Type of Paramedic of [place];Living will        Does patient want to make changes to medical advance directive? No - Patient declined        Copy of [place] in Chart? Yes - validated most recent copy scanned in chart (See row information)        Would patient like  information on creating a medical advance directive?     No - Patient declined      Current Medications (verified) Outpatient Encounter Medications as of 04/14/2022  Medication Sig   acetaminophen (TYLENOL) 325 MG tablet Take 325 mg by mouth every 6 (six) hours as needed for moderate pain.   ALPRAZolam (XANAX XR) 0.5 MG 24 hr tablet Take 1 tablet (0.5 mg total) by mouth daily.   ALPRAZolam (XANAX) 0.5 MG tablet Take 1 tablet (0.5 mg total) by mouth 3 (three) times daily as needed. for anxiety   aspirin 81 MG tablet Take 1 tablet (81 mg total) by mouth daily. Restart on 08/31/20   bisacodyl (DULCOLAX) 5 MG EC tablet Take 5 mg by mouth daily as needed for moderate constipation.   carboxymethylcellulose (REFRESH PLUS) 0.5 % SOLN Place 1-2 drops into both eyes 3 (three) times daily as needed (dry eyes).   Cholecalciferol (D3 PO) Take 1 capsule by mouth daily.   cilostazol (PLETAL) 100 MG tablet Take 1 tablet (100 mg total) by mouth 2 (two) times daily before a meal.   citalopram (CELEXA) 10 MG tablet Take 1 tablet by mouth once daily   fluticasone (FLONASE) 50 MCG/ACT nasal spray PLACE 2 SPRAYS INTO BOTH NOSTRILS DAILY. (Patient taking differently: Place 2 sprays into both nostrils daily as needed for allergies.)   folic acid (FOLVITE) 1 MG tablet Take 1 tablet (1 mg total)  by mouth daily.   Lacosamide 100 MG TABS Take 1 tablet (100 mg total) by mouth 2 (two) times daily.   levETIRAcetam (KEPPRA) 100 MG/ML solution TAKE 15 ML BY MOUTH  TWICE DAILY   loratadine (CLARITIN) 10 MG tablet Take 10 mg by mouth daily as needed for allergies.   magnesium hydroxide (MILK OF MAGNESIA) 800 MG/5ML suspension Take 30 mLs by mouth daily as needed for constipation.   Menthol, Topical Analgesic, (BIOFREEZE EX) Apply 1 application topically as needed (pain).   mirtazapine (REMERON) 15 MG tablet Take 0.5 tablets (7.5 mg total) by mouth at bedtime.   olopatadine (PATANOL) 0.1 % ophthalmic solution Place 1 drop into  both eyes 2 (two) times daily as needed for allergies.   omeprazole (PRILOSEC) 40 MG capsule TAKE 1 CAPSULE TWICE DAILY BEFORE MEALS (Patient taking differently: Take 40 mg by mouth daily.)   polyethylene glycol (MIRALAX / GLYCOLAX) 17 g packet Take 17 g by mouth 2 (two) times daily.   pravastatin (PRAVACHOL) 40 MG tablet Take 1 tablet (40 mg total) by mouth every evening.   Probiotic Product (PROBIOTIC DAILY PO) Take 1 capsule by mouth daily.   prochlorperazine (COMPAZINE) 10 MG tablet TAKE 1 TABLET BY MOUTH EVERY 6 HOURS AS NEEDED FOR NAUSEA OR VOMITING   simethicone (MYLICON) 80 MG chewable tablet Chew 1 tablet (80 mg total) by mouth 4 (four) times daily as needed for flatulence (Bloating).   No facility-administered encounter medications on file as of 04/14/2022.    Allergies (verified) Amlodipine, Chantix [varenicline tartrate], Clarithromycin, Lisinopril, Simvastatin, Wellbutrin [bupropion hcl], Lipitor [atorvastatin], and Sertraline   History: Past Medical History:  Diagnosis Date   Allergy    Anemia    Anxiety    Arthritis    Carpal tunnel syndrome    Constipation    senna C stool softeners help    Depression    Diverticulosis    Dyslipidemia    External hemorrhoids    GERD (gastroesophageal reflux disease)    Heart murmur    mild-moderate AR   Hiatal hernia    Hyperlipidemia    on meds    Hypertension    Internal hemorrhoids    lung ca with brain mets 07/2020   Osteoarthritis    Pre-diabetes    PVD (peripheral vascular disease) (HCC)    moderate carotid disease   RBBB    Smoker    Vocal cord polyps    Past Surgical History:  Procedure Laterality Date   ABDOMINAL HYSTERECTOMY  2426   APPLICATION OF CRANIAL NAVIGATION N/A 08/24/2020   Procedure: APPLICATION OF CRANIAL NAVIGATION;  Surgeon: Judith Part, MD;  Location: [place];  Service: Neurosurgery;  Laterality: N/A;   COLONOSCOPY     COLONOSCOPY W/ POLYPECTOMY  2009   CRANIOTOMY Left 08/24/2020    Procedure: Left Craniotomy for Tumor Resection with Brainlab;  Surgeon: Judith Part, MD;  Location: [place];  Service: Neurosurgery;  Laterality: Left;   HEMORRHOID SURGERY     INTERCOSTAL NERVE BLOCK Right 09/24/2020   Procedure: INTERCOSTAL NERVE BLOCK;  Surgeon: Melrose Nakayama, MD;  Location: [place];  Service: Thoracic;  Laterality: Right;   JOINT REPLACEMENT     LYMPH NODE DISSECTION Right 09/24/2020   Procedure: LYMPH NODE DISSECTION;  Surgeon: Melrose Nakayama, MD;  Location: [place];  Service: Thoracic;  Laterality: Right;   POLYPECTOMY     right total knee arthroplasty     Dr. Emeterio Reeve 06-04-18   [place]  08/16/2009   TOTAL KNEE ARTHROPLASTY Left 02/13/2014   Procedure: TOTAL KNEE ARTHROPLASTY;  Surgeon: Alta Corning, MD;  Location: [place];  Service: Orthopedics;  Laterality: Left;   TOTAL KNEE ARTHROPLASTY Right 06/04/2018   Procedure: RIGHT TOTAL KNEE ARTHROPLASTY;  Surgeon: Dorna Leitz, MD;  Location: [facility];  Service: Orthopedics;  Laterality: Right;  Adductor Block   TUBAL LIGATION     Family History  Problem Relation Age of Onset   Cancer Father    Prostate cancer Father    Diabetes Sister    Cancer Sister    Stroke Maternal Grandfather    Colitis Maternal Aunt    Breast cancer Maternal Aunt    Colon cancer Neg Hx    Colon polyps Neg Hx    Rectal cancer Neg Hx    Stomach cancer Neg Hx    Esophageal cancer Neg Hx    Social History   Socioeconomic History   Marital status: Married    Spouse name: Not on file   Number of children: 2   Years of education: Not on file   Highest education level: Not on file  Occupational History   Not on file  Tobacco Use   Smoking status: Former    Packs/day: 0.25    Years: 40.00    Total pack years: 10.00    Types: Cigarettes    Quit date: 07/09/2020    Years since quitting: 1.7    Passive exposure: Never   Smokeless tobacco: Never   Tobacco comments:    PACK WILL LAST 3 days  Vaping Use   Vaping Use:  Never used  Substance and Sexual Activity   Alcohol use: No   Drug use: No   Sexual activity: Not Currently  Other Topics Concern   Not on file  Social History Narrative   Not on file   Social Determinants of Health   Financial Resource Strain: Low Risk  (04/14/2022)   Overall Financial Resource Strain (CARDIA)    Difficulty of Paying Living Expenses: Not hard at all  Food Insecurity: No Food Insecurity (04/14/2022)   Hunger Vital Sign    Worried About Running Out of Food in the Last Year: Never true    Ran Out of Food in the Last Year: Never true  Transportation Needs: No Transportation Needs (04/14/2022)   PRAPARE - Hydrologist (Medical): No    Lack of Transportation (Non-Medical): No  Physical Activity: Insufficiently Active (04/14/2022)   Exercise Vital Sign    Days of Exercise per Week: 2 days    Minutes of Exercise per Session: 60 min  Stress: No Stress Concern Present (04/14/2022)   [place]    Feeling of Stress : Not at all  Social Connections: [place] (04/14/2022)   Social Connection and Isolation Panel [NHANES]    Frequency of Communication with Friends and Family: More than three times a week    Frequency of Social Gatherings with Friends and Family: More than three times a week    Attends Religious Services: More than 4 times per year    Active Member of [employer] or Organizations: Yes    Attends Music therapist: More than 4 times per year    Marital Status: Married    Tobacco Counseling Counseling given: Not Answered Tobacco comments: PACK WILL LAST 3 days   Clinical Intake:  Pre-visit preparation completed: Yes  Pain : No/denies pain Pain  Score: 0-No pain     BMI - recorded: 24.62 (04/10/2022) Nutritional Status: BMI of 19-24  Normal Nutritional Risks: None Diabetes: No  How often do you need to have someone help you when you  read instructions, pamphlets, or other written materials from your doctor or pharmacy?: 1 - Never What is the last grade level you completed in school?: HSG  Diabetic? no  Interpreter Needed?: No  Information entered by :: Lisette Abu, LPN.   Activities of Daily Living    04/14/2022    9:07 AM 04/23/2021    3:11 PM  In your present state of health, do you have any difficulty performing the following activities:  Hearing? 0 0  Vision? 0 0  Difficulty concentrating or making decisions? 0 0  Walking or climbing stairs? 0 0  Dressing or bathing? 0 0  Doing errands, shopping? 1 0  Preparing Food and eating ? N   Using the Toilet? N   In the past six months, have you accidently leaked urine? Y   Do you have problems with loss of bowel control? N   Managing your Medications? N   Managing your Finances? N   Housekeeping or managing your Housekeeping? N     Patient Care Team: [name], MD as PCP - General (Internal Medicine) Minus Breeding, MD as PCP - Cardiology (Cardiology) Charlton Haws, [facility] as Pharmacist (Pharmacist) [place], P.A. as Consulting Physician (Ophthalmology)  Indicate any recent Medical Services you may have received from other than [facility] providers in the past year (date may be approximate).     Assessment:   This is a routine wellness examination for Hannah [name].  Hearing/Vision screen Hearing Screening - Comments:: Denies hearing difficulties   Vision Screening - Comments:: Wears rx glasses - up to date with routine eye exams with [facility]    Dietary issues and exercise activities discussed: Current Exercise Habits: Home exercise routine, Type of exercise: walking, Time (Minutes): 60, Frequency (Times/Week): 2, Weekly Exercise (Minutes/Week): 120, Intensity: Moderate, Exercise limited by: neurologic condition(s);psychological condition(s);respiratory conditions(s)   Goals Addressed             This Visit's Progress     My goal is to keep getting better and start back driving.        Depression Screen    04/14/2022    8:55 AM 03/21/2022    9:21 AM 03/06/2022   10:49 AM 12/11/2021    9:52 AM 10/02/2021   10:00 AM 08/01/2021   10:00 AM 05/28/2021    1:31 PM  PHQ 2/9 Scores  PHQ - 2 Score 0 0 0 2 1 0 0  PHQ- 9 Score 1 1  8        Fall Risk    04/14/2022    8:52 AM 03/06/2022   10:49 AM 01/01/2022    9:45 AM 12/11/2021    9:51 AM 10/02/2021   10:00 AM  Fall Risk   Falls in the past year? 0 0 1 1 1   Comment   denies new/ recent falls since last outreach 10/02/21; currently not using assistive devices  denies new/ recent falls since last outreach 08/01/21- does not currently use assistive devices  Number falls in past yr: 0 0 0 0 0  Injury with Fall? 0 0 1 0 0  Risk for fall due to : No Fall Risks No Fall Risks History of fall(s);Medication side effect;Other (Comment) No Fall Risks History of fall(s);Medication side effect  Risk  for fall due to: Comment   ongoing weakness, fatigue, tiredness    Follow up Falls prevention discussed Falls evaluation completed Falls prevention discussed Falls evaluation completed Falls prevention discussed    FALL RISK PREVENTION PERTAINING TO THE HOME:  Any stairs in or around the home? No  If so, are there any without handrails? No  Home free of loose throw rugs in walkways, pet beds, electrical cords, etc? Yes  Adequate lighting in your home to reduce risk of falls? Yes   ASSISTIVE DEVICES UTILIZED TO PREVENT FALLS:  Life alert? No  Use of a cane, walker or w/c? No  Grab bars in the bathroom? Yes  Shower chair or bench in shower? No  Elevated toilet seat or a handicapped toilet? No   TIMED UP AND GO:  Was the test performed? No . Phone Visit  Cognitive Function:    12/21/2017    8:43 AM  MMSE - Mini Mental State Exam  Orientation to time 5  Orientation to Place 5  Registration 3  Attention/ Calculation 5  Recall 3  Language- name 2 objects 2  Language-  repeat 1  Language- follow 3 step command 3  Language- read & follow direction 1  Write a sentence 1  Copy design 1  Total score 30        04/14/2022    9:08 AM  6CIT Screen  What Year? 0 points  What month? 0 points  What time? 0 points  Count back from 20 0 points  Months in reverse 4 points  Repeat phrase 2 points  Total Score 6 points    Immunizations Immunization History  Administered Date(s) Administered   Fluad Quad(high Dose 65+) 03/14/2019, 03/14/2020   Moderna SARS-COV2 Booster Vaccination 05/01/2020   Moderna Sars-Covid-2 Vaccination 08/22/2019, 09/20/2019   Pneumococcal Conjugate-13 05/30/2014   Pneumococcal Polysaccharide-23 02/27/2016   Td 08/01/2004   Tdap 05/30/2014    TDAP status: Up to date  Flu Vaccine status: Due, Education has been provided regarding the importance of this vaccine. Advised may receive this vaccine at local pharmacy or Health Dept. Aware to provide a copy of the vaccination record if obtained from local pharmacy or Health Dept. Verbalized acceptance and understanding.  Pneumococcal vaccine status: Up to date  Covid-19 vaccine status: Completed vaccines  Qualifies for Shingles Vaccine? Yes   Zostavax completed No   Shingrix Completed?: No.    Education has been provided regarding the importance of this vaccine. Patient has been advised to call insurance company to determine out of pocket expense if they have not yet received this vaccine. Advised may also receive vaccine at local pharmacy or Health Dept. Verbalized acceptance and understanding.  Screening Tests Health Maintenance  Topic Date Due   COVID-19 Vaccine (3 - Moderna risk series) 05/29/2020   OPHTHALMOLOGY EXAM  05/30/2021   DEXA SCAN  09/02/2021   FOOT EXAM  03/20/2022   INFLUENZA VACCINE  06/07/2022 (Originally 01/28/2022)   MAMMOGRAM  05/30/2022   HEMOGLOBIN A1C  09/19/2022   Diabetic kidney evaluation - Urine ACR  03/22/2023   Diabetic kidney evaluation - GFR  measurement  04/11/2023   COLONOSCOPY (Pts 45-80yrs Insurance coverage will need to be confirmed)  05/19/2024   TETANUS/TDAP  05/30/2024   Pneumonia Vaccine 28+ Years old  Completed   Hepatitis C Screening  Completed   HPV VACCINES  Aged Out   Zoster Vaccines- Shingrix  Discontinued    Health Maintenance  Health Maintenance Due  Topic Date Due  COVID-19 Vaccine (3 - Moderna risk series) 05/29/2020   OPHTHALMOLOGY EXAM  05/30/2021   DEXA SCAN  09/02/2021   FOOT EXAM  03/20/2022    Colorectal cancer screening: Type of screening: Colonoscopy. Completed 05/20/2019. Repeat every 5 years  Mammogram status: Completed 05/30/2020. Repeat every year (Due every 2 years)  Bone Density status: Completed 09/02/2016. Results reflect: Bone density results: NORMAL. Repeat every 5 years.  Lung Cancer Screening: (Low Dose CT Chest recommended if Age 62-80 years, 30 pack-year currently smoking OR have quit w/in 15years.) does not qualify.   Lung Cancer Screening Referral: no  Additional Screening:  Hepatitis C Screening: does qualify; Completed 08/28/2016  Vision Screening: Recommended annual ophthalmology exams for early detection of glaucoma and other disorders of the eye. Is the patient up to date with their annual eye exam?  Yes  Who is the provider or what is the name of the office in which the patient attends annual eye exams? [facility] If pt is not established with a provider, would they like to be referred to a provider to establish care? No .   Dental Screening: Recommended annual dental exams for proper oral hygiene  Community Resource Referral / Chronic Care Management: CRR required this visit?  No   CCM required this visit?  No      Plan:     I have personally reviewed and noted the following in the patient's chart:   Medical and social history Use of alcohol, tobacco or illicit drugs  Current medications and supplements including opioid prescriptions. Patient is not  currently taking opioid prescriptions. Functional ability and status Nutritional status Physical activity Advanced directives List of other physicians Hospitalizations, surgeries, and ER visits in previous 12 months Vitals Screenings to include cognitive, depression, and falls Referrals and appointments  In addition, I have reviewed and discussed with patient certain preventive protocols, quality metrics, and best practice recommendations. A written personalized care plan for preventive services as well as general preventive health recommendations were provided to patient.     Sheral Flow, LPN   [id number]   Nurse Notes: N/A

## 2022-04-14 NOTE — Patient Instructions (Addendum)
Hannah [name] , Thank you for taking time to come for your Medicare Wellness Visit. I appreciate your ongoing commitment to your health goals. Please review the following plan we discussed and let me know if I can assist you in the future.   These are the goals we discussed:  Goals      My goal is to keep getting better and start back driving.        This is a list of the screening recommended for you and due dates:  Health Maintenance  Topic Date Due   COVID-19 Vaccine (3 - Moderna risk series) 05/29/2020   Eye exam for diabetics  05/30/2021   DEXA scan (bone density measurement)  09/02/2021   Complete foot exam   03/20/2022   Flu Shot  06/07/2022*   Mammogram  05/30/2022   Hemoglobin A1C  09/19/2022   Yearly kidney health urinalysis for diabetes  03/22/2023   Yearly kidney function blood test for diabetes  04/11/2023   Colon Cancer Screening  05/19/2024   Tetanus Vaccine  05/30/2024   Pneumonia Vaccine  Completed   Hepatitis C Screening: USPSTF Recommendation to screen - Ages 50-79 yo.  Completed   HPV Vaccine  Aged Out   Zoster (Shingles) Vaccine  Discontinued  *Topic was postponed. The date shown is not the original due date.    Advanced directives: Yes; documents on file.  Conditions/risks identified: Yes  Next appointment: Follow up in one year for your annual wellness visit on 04/16/2023 at 8:45 a.m. telephone visit with Mignon Pine, Nurse Health Advisor.  If you need to reschedule or cancel, please call [phone].   Preventive Care 73 Years and Older, Female Preventive care refers to lifestyle choices and visits with your health care provider that can promote health and wellness. What does preventive care include? A yearly physical exam. This is also called an annual well check. Dental exams once or twice a year. Routine eye exams. Ask your health care provider how often you should have your eyes checked. Personal lifestyle choices, including: Daily care of your  teeth and gums. Regular physical activity. Eating a healthy diet. Avoiding tobacco and drug use. Limiting alcohol use. Practicing safe sex. Taking low-dose aspirin every day. Taking vitamin and mineral supplements as recommended by your health care provider. What happens during an annual well check? The services and screenings done by your health care provider during your annual well check will depend on your age, overall health, lifestyle risk factors, and family history of disease. Counseling  Your health care provider may ask you questions about your: Alcohol use. Tobacco use. Drug use. Emotional well-being. Home and relationship well-being. Sexual activity. Eating habits. History of falls. Memory and ability to understand (cognition). Work and work Statistician. Reproductive health. Screening  You may have the following tests or measurements: Height, weight, and BMI. Blood pressure. Lipid and cholesterol levels. These may be checked every 5 years, or more frequently if you are over 74 years old. Skin check. Lung cancer screening. You may have this screening every year starting at age 8 if you have a 30-pack-year history of smoking and currently smoke or have quit within the past 15 years. Fecal occult blood test (FOBT) of the stool. You may have this test every year starting at age 50. Flexible sigmoidoscopy or colonoscopy. You may have a sigmoidoscopy every 5 years or a colonoscopy every 10 years starting at age 59. Hepatitis C blood test. Hepatitis B blood test. Sexually transmitted disease (STD) testing.  Diabetes screening. This is done by checking your blood sugar (glucose) after you have not eaten for a while (fasting). You may have this done every 1-3 years. Bone density scan. This is done to screen for osteoporosis. You may have this done starting at age 26. Mammogram. This may be done every 1-2 years. Talk to your health care provider about how often you should have  regular mammograms. Talk with your health care provider about your test results, treatment options, and if necessary, the need for more tests. Vaccines  Your health care provider may recommend certain vaccines, such as: Influenza vaccine. This is recommended every year. Tetanus, diphtheria, and acellular pertussis (Tdap, Td) vaccine. You may need a Td booster every 10 years. Zoster vaccine. You may need this after age 82. Pneumococcal 13-valent conjugate (PCV13) vaccine. One dose is recommended after age 49. Pneumococcal polysaccharide (PPSV23) vaccine. One dose is recommended after age 1. Talk to your health care provider about which screenings and vaccines you need and how often you need them. This information is not intended to replace advice given to you by your health care provider. Make sure you discuss any questions you have with your health care provider. Document Released: 07/13/2015 Document Revised: 03/05/2016 Document Reviewed: 04/17/2015 Elsevier Interactive Patient Education  2017 [place] Prevention in the Home Falls can cause injuries. They can happen to people of all ages. There are many things you can do to make your home safe and to help prevent falls. What can I do on the outside of my home? Regularly fix the edges of walkways and driveways and fix any cracks. Remove anything that might make you trip as you walk through a door, such as a raised step or threshold. Trim any bushes or trees on the path to your home. Use bright outdoor lighting. Clear any walking paths of anything that might make someone trip, such as rocks or tools. Regularly check to see if handrails are loose or broken. Make sure that both sides of any steps have handrails. Any raised decks and porches should have guardrails on the edges. Have any leaves, snow, or ice cleared regularly. Use sand or salt on walking paths during winter. Clean up any spills in your garage right away. This  includes oil or grease spills. What can I do in the bathroom? Use night lights. Install grab bars by the toilet and in the tub and shower. Do not use towel bars as grab bars. Use non-skid mats or decals in the tub or shower. If you need to sit down in the shower, use a plastic, non-slip stool. Keep the floor dry. Clean up any water that spills on the floor as soon as it happens. Remove soap buildup in the tub or shower regularly. Attach bath mats securely with double-sided non-slip rug tape. Do not have throw rugs and other things on the floor that can make you trip. What can I do in the bedroom? Use night lights. Make sure that you have a light by your bed that is easy to reach. Do not use any sheets or blankets that are too big for your bed. They should not hang down onto the floor. Have a firm chair that has side arms. You can use this for support while you get dressed. Do not have throw rugs and other things on the floor that can make you trip. What can I do in the kitchen? Clean up any spills right away. Avoid walking on wet floors.  Keep items that you use a lot in easy-to-reach places. If you need to reach something above you, use a strong step stool that has a grab bar. Keep electrical cords out of the way. Do not use floor polish or wax that makes floors slippery. If you must use wax, use non-skid floor wax. Do not have throw rugs and other things on the floor that can make you trip. What can I do with my stairs? Do not leave any items on the stairs. Make sure that there are handrails on both sides of the stairs and use them. Fix handrails that are broken or loose. Make sure that handrails are as long as the stairways. Check any carpeting to make sure that it is firmly attached to the stairs. Fix any carpet that is loose or worn. Avoid having throw rugs at the top or bottom of the stairs. If you do have throw rugs, attach them to the floor with carpet tape. Make sure that you  have a light switch at the top of the stairs and the bottom of the stairs. If you do not have them, ask someone to add them for you. What else can I do to help prevent falls? Wear shoes that: Do not have high heels. Have rubber bottoms. Are comfortable and fit you well. Are closed at the toe. Do not wear sandals. If you use a stepladder: Make sure that it is fully opened. Do not climb a closed stepladder. Make sure that both sides of the stepladder are locked into place. Ask someone to hold it for you, if possible. Clearly mark and make sure that you can see: Any grab bars or handrails. First and last steps. Where the edge of each step is. Use tools that help you move around (mobility aids) if they are needed. These include: Canes. Walkers. Scooters. Crutches. Turn on the lights when you go into a dark area. Replace any light bulbs as soon as they burn out. Set up your furniture so you have a clear path. Avoid moving your furniture around. If any of your floors are uneven, fix them. If there are any pets around you, be aware of where they are. Review your medicines with your doctor. Some medicines can make you feel dizzy. This can increase your chance of falling. Ask your doctor what other things that you can do to help prevent falls. This information is not intended to replace advice given to you by your health care provider. Make sure you discuss any questions you have with your health care provider. Document Released: 04/12/2009 Document Revised: 11/22/2015 Document Reviewed: 07/21/2014 Elsevier Interactive Patient Education  2017 [employer].

## 2022-04-15 ENCOUNTER — Other Ambulatory Visit: Payer: Self-pay

## 2022-04-17 DIAGNOSIS — H25813 Combined forms of age-related cataract, bilateral: Secondary | ICD-10-CM | POA: Diagnosis not present

## 2022-04-17 DIAGNOSIS — H1045 Other chronic allergic conjunctivitis: Secondary | ICD-10-CM | POA: Diagnosis not present

## 2022-04-17 DIAGNOSIS — H04123 Dry eye syndrome of bilateral lacrimal glands: Secondary | ICD-10-CM | POA: Diagnosis not present

## 2022-04-19 IMAGING — PT NM PET TUM IMG INITIAL (PI) SKULL BASE T - THIGH
1 of 7 series · 1 of 25 positions shown · non-contrast
Comparison: Chest CT 08/02/2020.

CLINICAL DATA: Initial treatment strategy for recently diagnosed
right upper lobe lung cancer. Intracranial metastatic disease.

EXAM:
NUCLEAR MEDICINE PET SKULL BASE TO THIGH
TECHNIQUE: 5.2 mCi F-18 FDG was injected intravenously. Full-ring PET imaging
was performed from the skull base to thigh after the radiotracer. CT
data was obtained and used for attenuation correction and anatomic
localization.
Fasting blood glucose: 87 mg/dl

[Series 4: ct sk_thigh 5.0 bf37 · axial · 5.0mm · 0.98mm/px · 1 of 199 slices shown]
[im 199/199  brain]
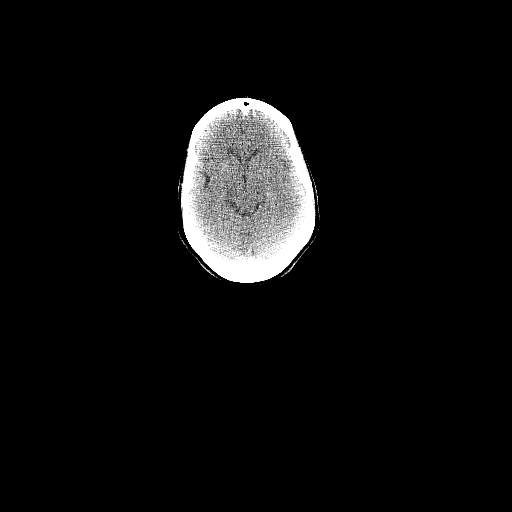

[1 of 25 positions shown; findings below may reference images not displayed]

FINDINGS: Mediastinal blood pool activity: SUV max

NECK:

No hypermetabolic cervical lymph nodes are identified.There is
asymmetric hypermetabolic activity in the region of the right
arytenoid cartilage (SUV max 6.4). No focal corresponding mucosal
abnormality identified on the CT images. No other lesions of the
pharyngeal mucosal space.

Incidental CT findings: Bilateral carotid atherosclerosis.

CHEST:

There are no hypermetabolic mediastinal, hilar or axillary lymph
nodes. The previously demonstrated large peripheral right upper lobe
mass demonstrates peripheral hypermetabolic activity with an SUV max
of 11.0. This lesion is centrally necrotic with areas of fluid
density and gas. It measures approximately 5.3 x 4.3 cm on image
[DATE]. The lesion extends to the visceral pleural surface, although
demonstrates no chest wall invasion. Tiny lingular nodule on image
38/8 is unchanged.

Incidental CT findings: Mild centrilobular and paraseptal emphysema.
Mild atherosclerosis of the aorta, great vessels and coronary
arteries.

ABDOMEN/PELVIS:

There is no hypermetabolic activity within the liver, adrenal
glands, spleen or pancreas. There is no hypermetabolic nodal
activity. There is nonspecific low-level and erectile activity (SUV
max 6.2). No other abnormal bowel activity.

Incidental CT findings: Calcified gallstones. Aortic and branch
vessel atherosclerosis. A degree of rectal prolapse is suspected.

SKELETON:

There is no hypermetabolic activity to suggest osseous metastatic
disease.

Incidental CT findings: none
IMPRESSION: 1. The previously demonstrated large right upper lobe mass is
peripherally hypermetabolic, consistent with bronchogenic carcinoma.
This lesion demonstrates progressive central necrosis.
2. No evidence of metastatic disease. By imaging, this corresponds
with stage II B disease (T3 N0 M0).
3. Nonspecific asymmetric activity associated with the arytenoid
cartilage, potentially physiologic. Correlate with direct
visualization if there are symptoms referable to this region.
4. Nonspecific anorectal activity, suspected to be related to a
degree of rectal prolapse. Correlate clinically.
5. Incidental findings as previously described, including
cholelithiasis and Aortic Atherosclerosis ([id number]).

## 2022-04-19 NOTE — Progress Notes (Unsigned)
Cardiology Office Note   Date:  04/21/2022   ID:  Hannah [name], DOB 1950/10/06, MRN [id number]  PCP:  [name], MD  Cardiologist:   Minus Breeding, MD   Chief Complaint  Patient presents with   Leg pain      History of Present Illness: Hannah [name] is a 71 y.o. female who presents for who presents for follow up of  PVD and a murmur.  She had mild sclerosis on her aortic valve with aortic insufficiency.    She had lung cancer stage IV non-small cell.  She had metastasis to the brain.  She had resection and craniotomy.  She had robot-assisted right upper lobectomy.  She has been treated with carboplatinum, Alimta and Keytruda.  She had an echo with moderate AI.  She underwent LITT to left parietal lesion on 10/14/21 at [facility]  She saw Dr. Carlis Abbott.  ABI on the right was 1.08 on the left 0.87.  Lower extremity duplex demonstrated 50 to 74% stenosis of the left SFA and 75 to 99% stenosis in the mid popliteal.  Pletal was suggested but she could not take this.  She said her biggest issue is being limited by left leg pain when she tries to ambulate.  She cannot make it less than 2 times around the track before she was able to do 10.  She is not getting any chest pressure, neck or arm discomfort.  She is not having any new palpitations, presyncope or syncope.   Past Medical History:  Diagnosis Date   Allergy    Anemia    Anxiety    Arthritis    Carpal tunnel syndrome    Constipation    senna C stool softeners help    Depression    Diverticulosis    Dyslipidemia    External hemorrhoids    GERD (gastroesophageal reflux disease)    Heart murmur    mild-moderate AR   Hiatal hernia    Hyperlipidemia    on meds    Hypertension    Internal hemorrhoids    lung ca with brain mets 07/2020   Osteoarthritis    Pre-diabetes    PVD (peripheral vascular disease) (HCC)    moderate carotid disease   RBBB    Smoker    Vocal cord polyps     Past Surgical History:  Procedure  Laterality Date   ABDOMINAL HYSTERECTOMY  6144   APPLICATION OF CRANIAL NAVIGATION N/A 08/24/2020   Procedure: APPLICATION OF CRANIAL NAVIGATION;  Surgeon: Judith Part, MD;  Location: [facility];  Service: Neurosurgery;  Laterality: N/A;   COLONOSCOPY     COLONOSCOPY W/ POLYPECTOMY  2009   CRANIOTOMY Left 08/24/2020   Procedure: Left Craniotomy for Tumor Resection with Brainlab;  Surgeon: Judith Part, MD;  Location: [facility];  Service: Neurosurgery;  Laterality: Left;   HEMORRHOID SURGERY     INTERCOSTAL NERVE BLOCK Right 09/24/2020   Procedure: INTERCOSTAL NERVE BLOCK;  Surgeon: Melrose Nakayama, MD;  Location: [facility];  Service: Thoracic;  Laterality: Right;   JOINT REPLACEMENT     LYMPH NODE DISSECTION Right 09/24/2020   Procedure: LYMPH NODE DISSECTION;  Surgeon: Melrose Nakayama, MD;  Location: [facility];  Service: Thoracic;  Laterality: Right;   POLYPECTOMY     right total knee arthroplasty     Dr. Emeterio Reeve 06-04-18   [place]  08/16/2009   TOTAL KNEE ARTHROPLASTY Left 02/13/2014   Procedure: TOTAL KNEE ARTHROPLASTY;  Surgeon:  Alta Corning, MD;  Location: [facility];  Service: Orthopedics;  Laterality: Left;   TOTAL KNEE ARTHROPLASTY Right 06/04/2018   Procedure: RIGHT TOTAL KNEE ARTHROPLASTY;  Surgeon: Dorna Leitz, MD;  Location: [facility];  Service: Orthopedics;  Laterality: Right;  Adductor Block   TUBAL LIGATION       Current Outpatient Medications  Medication Sig Dispense Refill   acetaminophen (TYLENOL) 325 MG tablet Take 325 mg by mouth every 6 (six) hours as needed for moderate pain.     ALPRAZolam (XANAX XR) 0.5 MG 24 hr tablet Take 1 tablet (0.5 mg total) by mouth daily. 30 tablet 5   ALPRAZolam (XANAX) 0.5 MG tablet Take 1 tablet (0.5 mg total) by mouth 3 (three) times daily as needed. for anxiety 90 tablet 0   aspirin 81 MG tablet Take 1 tablet (81 mg total) by mouth daily. Restart on 08/31/20 30 tablet    bisacodyl (DULCOLAX) 5 MG EC tablet Take 5 mg by mouth daily  as needed for moderate constipation.     carboxymethylcellulose (REFRESH PLUS) 0.5 % SOLN Place 1-2 drops into both eyes 3 (three) times daily as needed (dry eyes).     Cholecalciferol (D3 PO) Take 1 capsule by mouth daily.     citalopram (CELEXA) 10 MG tablet Take 1 tablet by mouth once daily 90 tablet 0   fluticasone (FLONASE) 50 MCG/ACT nasal spray PLACE 2 SPRAYS INTO BOTH NOSTRILS DAILY. (Patient taking differently: Place 2 sprays into both nostrils daily as needed for allergies.) 48 g 1   folic acid (FOLVITE) 1 MG tablet Take 1 tablet (1 mg total) by mouth daily. 30 tablet 3   Lacosamide 100 MG TABS Take 1 tablet (100 mg total) by mouth 2 (two) times daily. 60 tablet 3   levETIRAcetam (KEPPRA) 100 MG/ML solution TAKE 15 ML BY MOUTH  TWICE DAILY 473 mL 0   loratadine (CLARITIN) 10 MG tablet Take 10 mg by mouth daily as needed for allergies.     magnesium hydroxide (MILK OF MAGNESIA) 800 MG/5ML suspension Take 30 mLs by mouth daily as needed for constipation.     Menthol, Topical Analgesic, (BIOFREEZE EX) Apply 1 application topically as needed (pain).     mirtazapine (REMERON) 15 MG tablet Take 0.5 tablets (7.5 mg total) by mouth at bedtime. 90 tablet 2   olopatadine (PATANOL) 0.1 % ophthalmic solution Place 1 drop into both eyes 2 (two) times daily as needed for allergies.     omeprazole (PRILOSEC) 40 MG capsule TAKE 1 CAPSULE TWICE DAILY BEFORE MEALS (Patient taking differently: Take 40 mg by mouth daily.) 180 capsule 0   polyethylene glycol (MIRALAX / GLYCOLAX) 17 g packet Take 17 g by mouth 2 (two) times daily. 72 each 0   pravastatin (PRAVACHOL) 40 MG tablet Take 1 tablet (40 mg total) by mouth every evening. 90 tablet 3   Probiotic Product (PROBIOTIC DAILY PO) Take 1 capsule by mouth daily.     prochlorperazine (COMPAZINE) 10 MG tablet TAKE 1 TABLET BY MOUTH EVERY 6 HOURS AS NEEDED FOR NAUSEA OR VOMITING 30 tablet 0   simethicone (MYLICON) 80 MG chewable tablet Chew 1 tablet (80 mg total)  by mouth 4 (four) times daily as needed for flatulence (Bloating). 30 tablet 0   cilostazol (PLETAL) 100 MG tablet Take 1 tablet (100 mg total) by mouth 2 (two) times daily before a meal. (Patient not taking: Reported on 04/21/2022) 60 tablet 11   No current facility-administered medications for this visit.  Allergies:   Amlodipine, Chantix [varenicline tartrate], Clarithromycin, Lisinopril, Simvastatin, Wellbutrin [bupropion hcl], Lipitor [atorvastatin], and Sertraline    ROS:  Please see the history of present illness.   Otherwise, review of systems are positive for none.   All other systems are reviewed and negative.     PHYSICAL EXAM: VS:  BP 126/60   Pulse 79   Ht 5' (1.524 m)   Wt 130 lb 3.2 oz (59.1 kg)   SpO2 97%   BMI 25.43 kg/m  , BMI Body mass index is 25.43 kg/m. GENERAL:  Well appearing NECK:  No jugular venous distention, waveform within normal limits, carotid upstroke brisk and symmetric, no bruits, no thyromegaly LUNGS:  Clear to auscultation bilaterally CHEST:  Unremarkable HEART:  PMI not displaced or sustained,S1 and S2 within normal limits, no S3, no S4, no clicks, no rubs, 2 out of 6 brief apical systolic murmur radiating slightly at the aortic outflow tract, no diastolic murmurs ABD:  Flat, positive bowel sounds normal in frequency in pitch, no bruits, no rebound, no guarding, no midline pulsatile mass, no hepatomegaly, no splenomegaly EXT:  2 plus pulses upper and absent dorsalis pedis and posterior tibialis bilateral lower, no edema, no cyanosis no clubbing   EKG:  EKG is  ordered today. The ekg ordered today demonstrates sinus rhythm, rate 80, right bundle branch block, left anterior fascicular block no change from previous.  No change from previous   Recent Labs: 04/10/2022: ALT 19; BUN 12; Creatinine 0.75; Hemoglobin 10.9; Platelet Count 259; Potassium 4.0; Sodium 141; TSH 0.841    Lipid Panel    Component Value Date/Time   CHOL 185 03/21/2022  1000   TRIG 212.0 (H) 03/21/2022 1000   HDL 52.30 03/21/2022 1000   CHOLHDL 4 03/21/2022 1000   VLDL 42.4 (H) 03/21/2022 1000   LDLCALC 62 03/14/2020 1016   LDLDIRECT 89.0 03/21/2022 1000      Wt Readings from Last 3 Encounters:  04/21/22 130 lb 3.2 oz (59.1 kg)  04/14/22 130 lb 4.7 oz (59.1 kg)  04/10/22 130 lb 3 oz (59.1 kg)      Other studies Reviewed: Additional studies/ records that were reviewed today include: Oncology records Review of the above records demonstrates:  Please see elsewhere in the note.     ASSESSMENT AND PLAN:  AS/AI:  This is mild to moderate on echo in March 2022.  I will check another echocardiogram in March of next year.  RBBB/LAFB:     She has no symptoms related to this.  No change in therapy.  CAROTID STENOSIS:   She has 60 - 69% on the left in Sept. I can follow this again in September of next year.   DYSLIPIDEMIA:     LDL was 62 with an HDL 52.  No change in therapy.   DM: Her hemoglobin A1c is 6.4.  Per primary care   TOBACCO:   She did stop smoking.   HYPERTENSION:  Her BP was.  No change in therapy.   CLAUDICATION:   This is the most limiting issue and she would like a second opinion.  I will have her see Dr. Gwenlyn Found  Current medicines are reviewed at length with the patient today.  The patient does not have concerns regarding medicines.  The following changes have been made:  None   Labs/ tests ordered today include:   None    Orders Placed This Encounter  Procedures   EKG 12-Lead      Disposition:  FU with me in 6  months.     Signed, Minus Breeding, MD  04/21/2022 12:43 PM    [facility]

## 2022-04-21 ENCOUNTER — Encounter: Payer: Self-pay | Admitting: Cardiology

## 2022-04-21 ENCOUNTER — Ambulatory Visit: Payer: Medicare HMO | Attending: Cardiology | Admitting: Cardiology

## 2022-04-21 VITALS — BP 126/60 | HR 79 | Ht 60.0 in | Wt 130.2 lb

## 2022-04-21 DIAGNOSIS — E118 Type 2 diabetes mellitus with unspecified complications: Secondary | ICD-10-CM | POA: Diagnosis not present

## 2022-04-21 DIAGNOSIS — I10 Essential (primary) hypertension: Secondary | ICD-10-CM

## 2022-04-21 DIAGNOSIS — I350 Nonrheumatic aortic (valve) stenosis: Secondary | ICD-10-CM | POA: Diagnosis not present

## 2022-04-21 DIAGNOSIS — I6522 Occlusion and stenosis of left carotid artery: Secondary | ICD-10-CM | POA: Diagnosis not present

## 2022-04-21 DIAGNOSIS — I739 Peripheral vascular disease, unspecified: Secondary | ICD-10-CM | POA: Diagnosis not present

## 2022-04-21 DIAGNOSIS — I4510 Unspecified right bundle-branch block: Secondary | ICD-10-CM | POA: Diagnosis not present

## 2022-04-21 IMAGING — DX DG CHEST 1V PORT
1 series · 1 of 1 positions shown · non-contrast
Comparison: Chest radiograph May 18, 2018; PET-CT August 08, 2020

CLINICAL DATA: Status post lung biopsy

EXAM:
PORTABLE CHEST 1 VIEW

[chest ap]
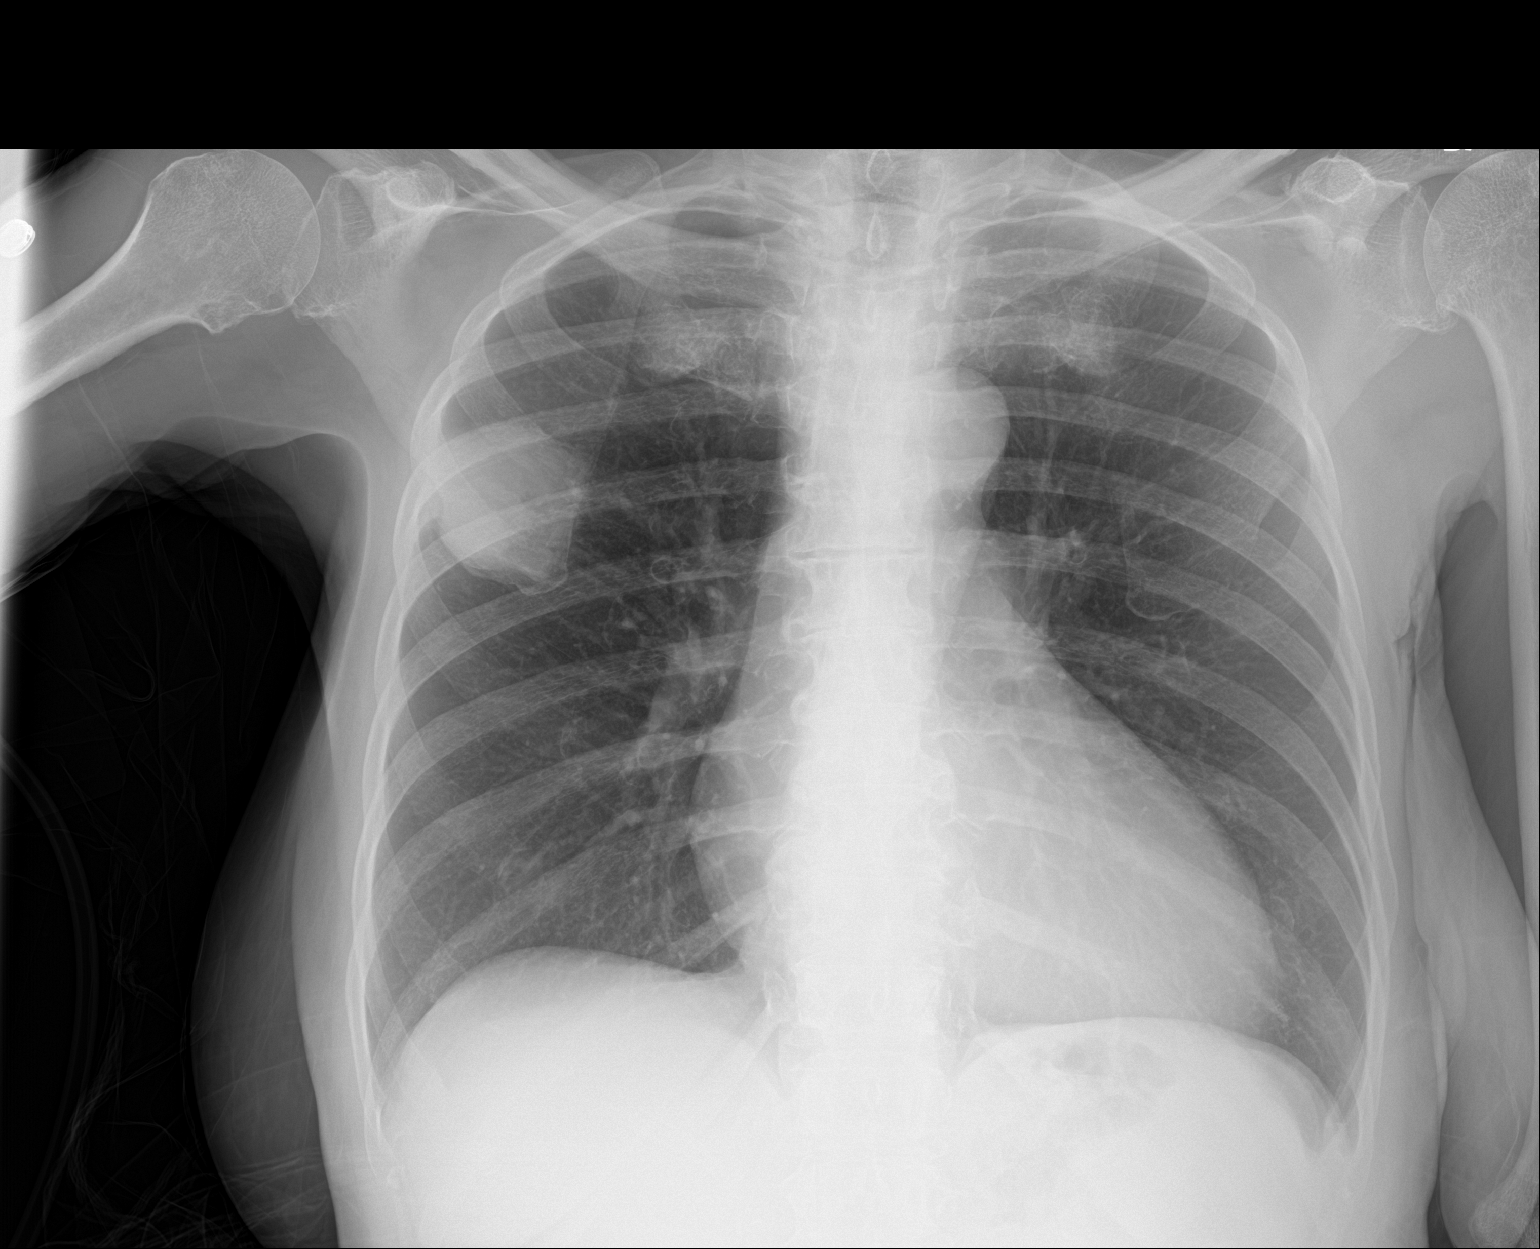

[1 of 1 positions shown; findings below may reference images not displayed]

FINDINGS: No pneumothorax. Right upper lobe mass again noted measuring 4.6 x
3.9 cm. Lungs elsewhere clear. Heart size and pulmonary vascularity
are normal. No adenopathy. No bone lesions.
IMPRESSION: No pneumothorax. Essentially stable mass right upper lobe. Lungs
elsewhere clear. Cardiac silhouette within normal limits.

## 2022-04-21 IMAGING — CT CT BIOPSY
1 of 2 series · 15 of 31 positions shown, 19 images · non-contrast
Comparison: none

INDICATION: Right upper lobe pulmonary mass with brain Mets concerning for
metastatic lung disease. She presents for CT-guided biopsy of the
lung mass.

[Series 4: i-spiral 5.0 br40 · axial · 0.86mm/px · z∈[-83,+25]mm · 15 of 37 slices shown, 19 images]
[im 3/37  mediastinal]
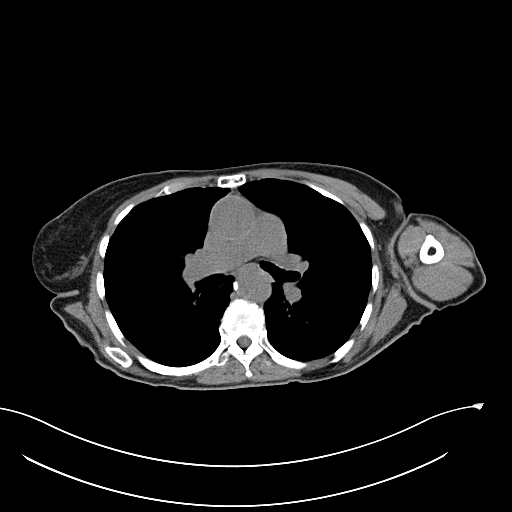
[im 3/37  lung]
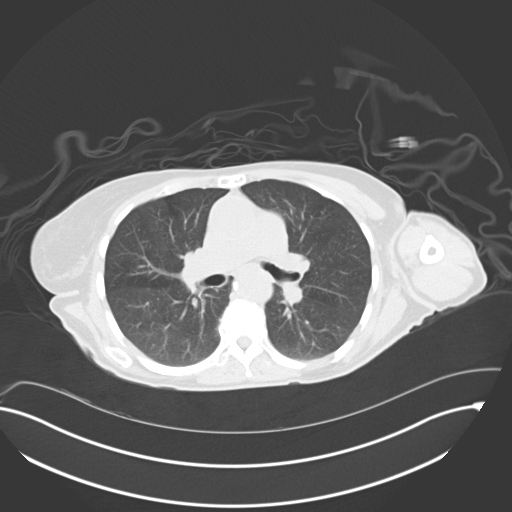
[im 5/37  lung]
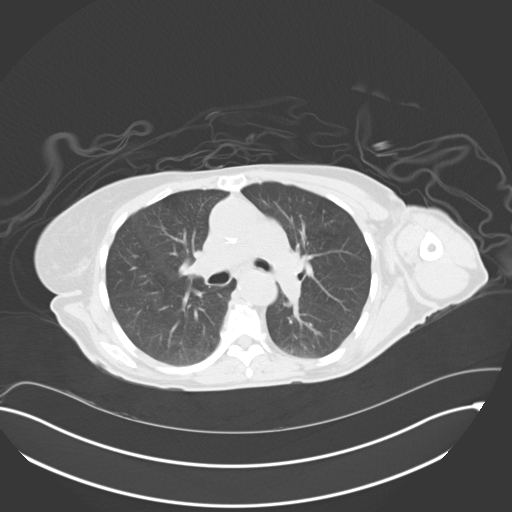
[im 7/37  lung]
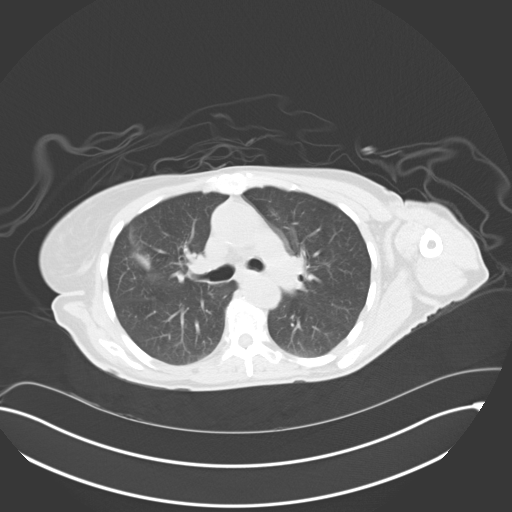
[im 10/37  lung]
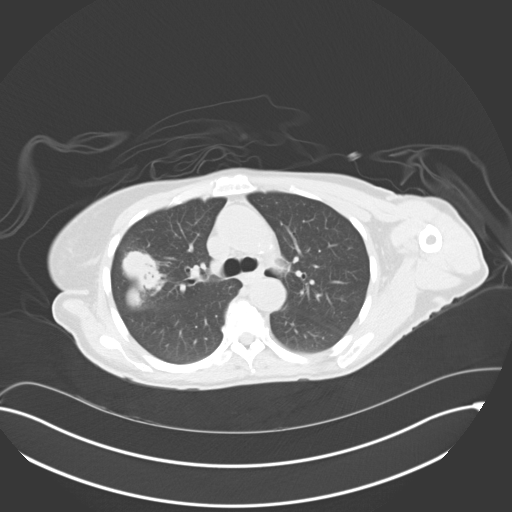
[im 13/37  mediastinal]
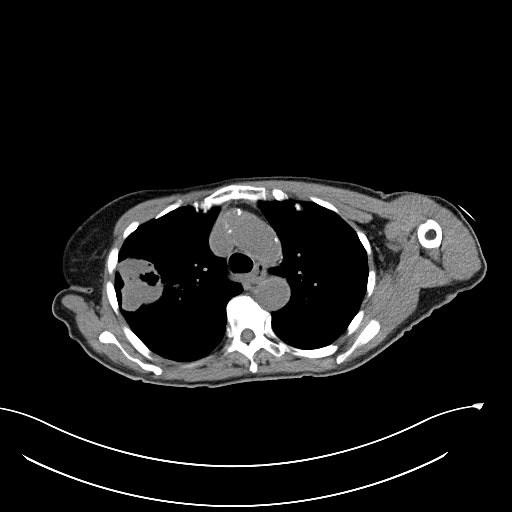
[im 13/37  lung]
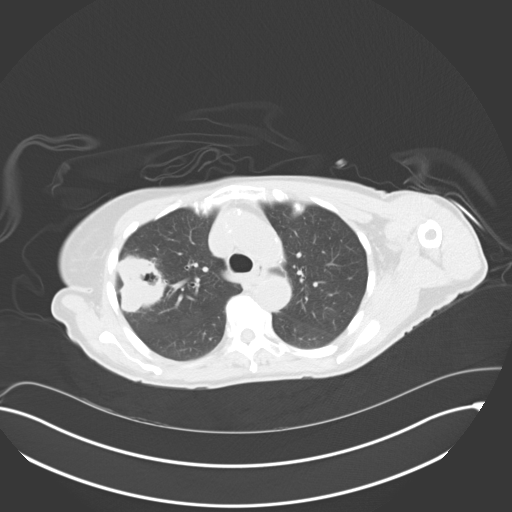
[im 14/37  lung]
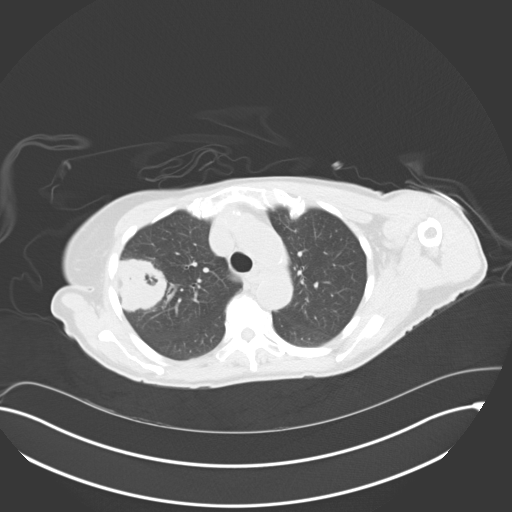
[im 16/37  lung]
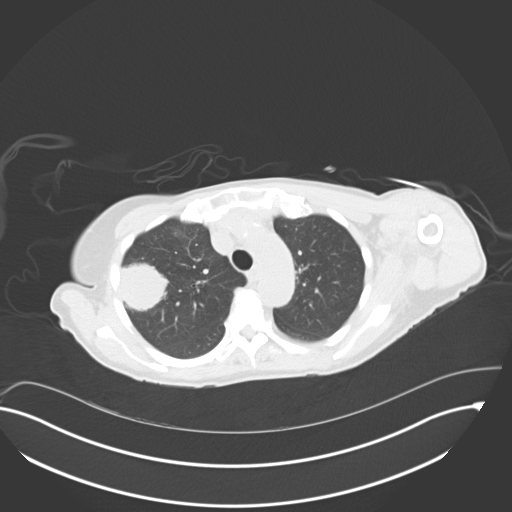
[im 17/37  lung]
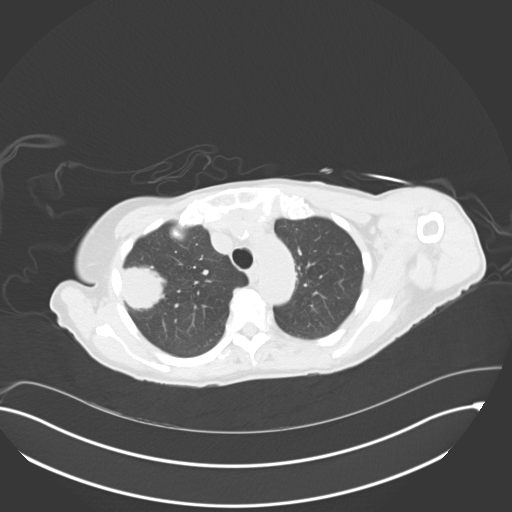
[im 19/37  mediastinal]
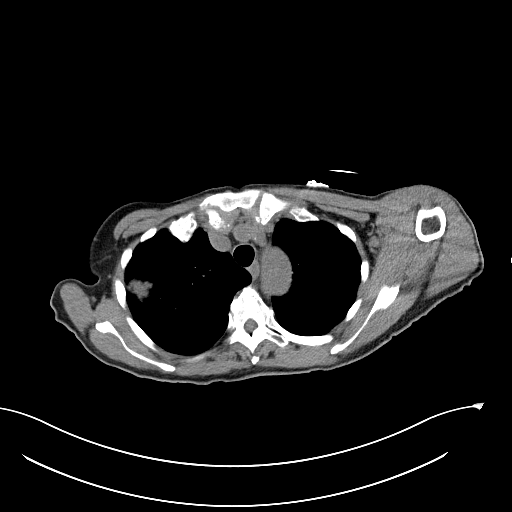
[im 19/37  lung]
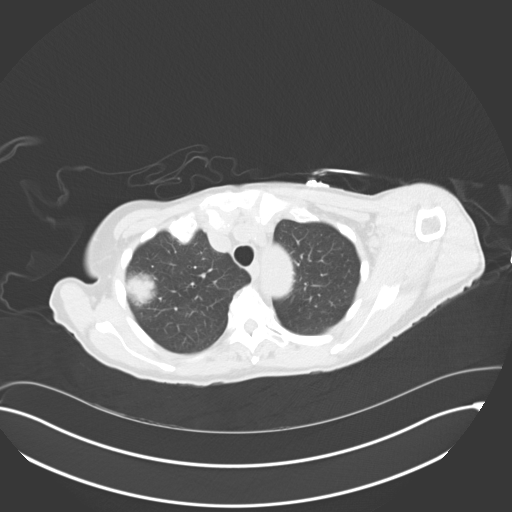
[im 21/37  lung]
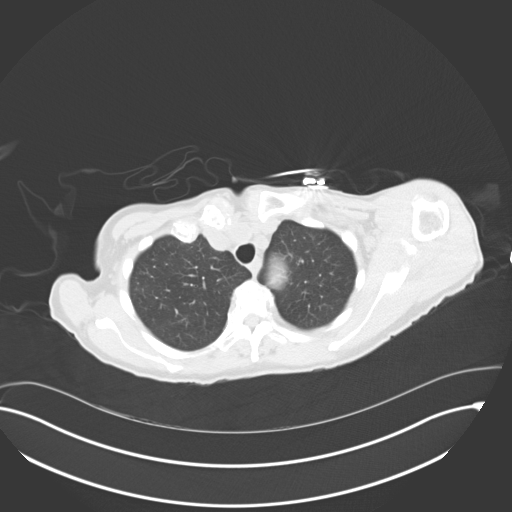
[im 23/37  lung]
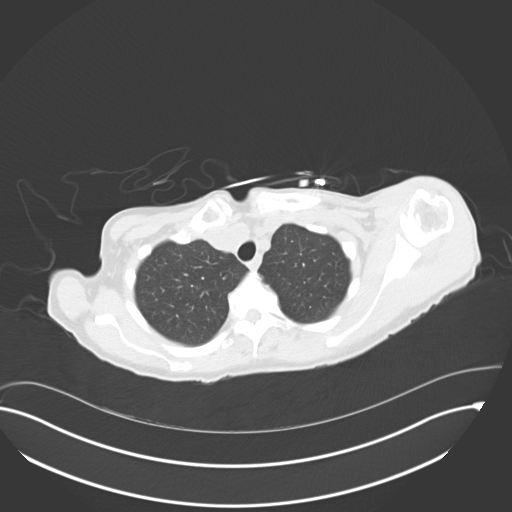
[im 28/37  lung]
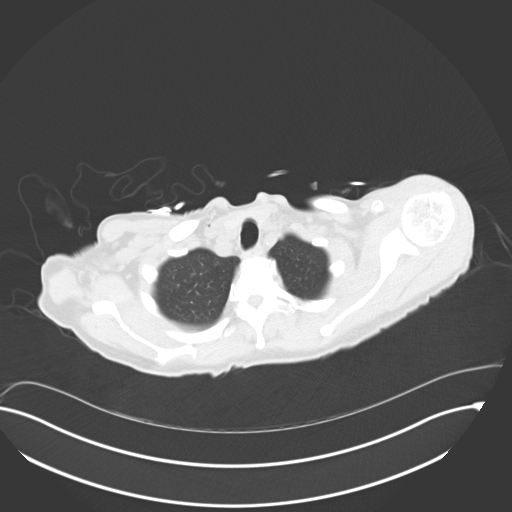
[im 30/37  mediastinal]
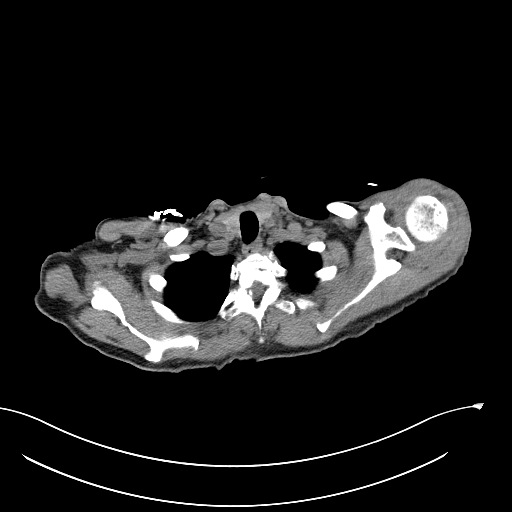
[im 30/37  lung]
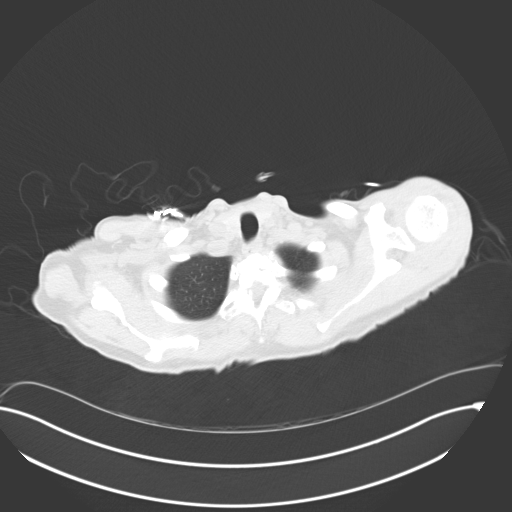
[im 32/37  lung]
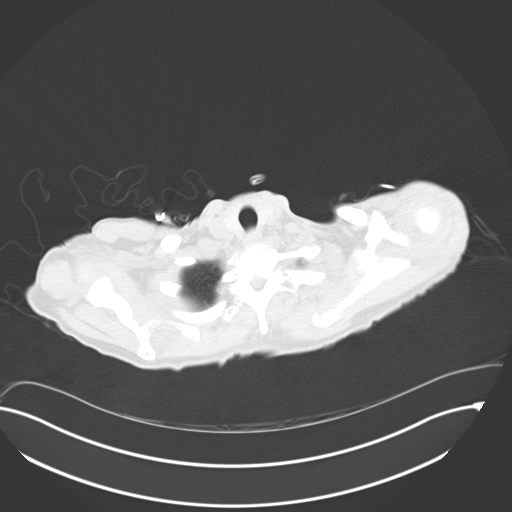
[im 34/37  lung]
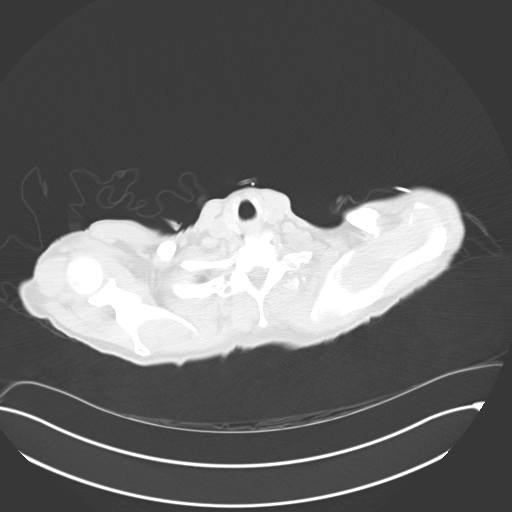

[15 of 31 positions shown; findings below may reference images not displayed]

EXAM:
CT-guided biopsy right upper lobe pulmonary nodule

MEDICATIONS:
None.

ANESTHESIA/SEDATION:
Fentanyl 50 mcg IV; Versed 1.5 mg IV

Moderate Sedation Time:  15 minutes

The patient was continuously monitored during the procedure by the
interventional radiology nurse under my direct supervision.

FLUOROSCOPY TIME:  None.

COMPLICATIONS:
None immediate.

Estimated blood loss:  0

PROCEDURE:
Informed written consent was obtained from the patient after a
thorough discussion of the procedural risks, benefits and
alternatives. All questions were addressed. Maximal Sterile Barrier
Technique was utilized including caps, mask, sterile gowns, sterile
gloves, sterile drape, hand hygiene and skin antiseptic. A timeout
was performed prior to the initiation of the procedure.

A planning axial CT scan was performed. The nodule in the right
upper lobe was successfully identified. A suitable skin entry site
was selected and marked. The region was then sterilely prepped and
draped in standard fashion using Betadine skin prep. Local
anesthesia was attained by infiltration with 1% lidocaine. A small
dermatotomy was made. Under intermittent CT fluoroscopic guidance, a
17 gauge trocar needle was advanced into the lung and positioned at
the margin of the nodule.

Multiple 18 gauge core biopsies were then coaxially obtained using
the BioPince automated biopsy device. Biopsy specimens were placed
in formalin and delivered to pathology for further analysis. The
biopsy device and introducer needle were removed. A bio sentry
device was deployed. Post biopsy axial CT imaging demonstrates no
evidence of immediate complication. There is no pneumothorax. The
patient tolerated the procedure well.
IMPRESSION: Technically successful CT-guided biopsy right upper lobe pulmonary
nodule.

## 2022-04-21 NOTE — Addendum Note (Signed)
Addended by: Kathyrn Lass on: 04/21/2022 12:45 PM   Modules accepted: Orders

## 2022-04-21 NOTE — Patient Instructions (Signed)
Medication Instructions:  Continue all medications *If you need a refill on your cardiac medications before your next appointment, please call your pharmacy*   Lab Work: None ordered   Testing/Procedures: Schedule Echo 08/2022   Follow-Up: At [facility], you and your health needs are our priority.  As part of our continuing mission to provide you with exceptional heart care, we have created designated Provider Care Teams.  These Care Teams include your primary Cardiologist (physician) and Advanced Practice Providers (APPs -  Physician Assistants and Nurse Practitioners) who all work together to provide you with the care you need, when you need it.  We recommend signing up for the patient portal called "MyChart".  Sign up information is provided on this After Visit Summary.  MyChart is used to connect with patients for Virtual Visits (Telemedicine).  Patients are able to view lab/test results, encounter notes, upcoming appointments, etc.  Non-urgent messages can be sent to your provider as well.   To learn more about what you can do with MyChart, go to [email or web address].    Your next appointment:  6 months   Call in Jan to schedule April appointment     The format for your next appointment: Office   Provider: Dr.Hochrein   Schedule appointment with Dr.Berry for  PAD   Important Information About Sugar

## 2022-04-24 ENCOUNTER — Other Ambulatory Visit: Payer: Self-pay | Admitting: Internal Medicine

## 2022-04-25 ENCOUNTER — Ambulatory Visit
Admission: RE | Admit: 2022-04-25 | Discharge: 2022-04-25 | Disposition: A | Discharge status: Home / Self Care | Payer: Medicare HMO | Source: Ambulatory Visit | Attending: Internal Medicine | Admitting: Internal Medicine

## 2022-04-25 DIAGNOSIS — C3490 Malignant neoplasm of unspecified part of unspecified bronchus or lung: Secondary | ICD-10-CM

## 2022-04-25 DIAGNOSIS — C711 Malignant neoplasm of frontal lobe: Secondary | ICD-10-CM | POA: Diagnosis not present

## 2022-04-25 MED ORDER — GADOPICLENOL 0.5 MMOL/ML IV SOLN
6.0000 mL | Freq: Once | INTRAVENOUS | Status: AC | PRN
  Administered 2022-04-25: 6 mL via INTRAVENOUS

## 2022-04-28 ENCOUNTER — Telehealth: Payer: Self-pay | Admitting: Internal Medicine

## 2022-04-28 ENCOUNTER — Inpatient Hospital Stay: Payer: Medicare HMO

## 2022-04-28 ENCOUNTER — Inpatient Hospital Stay (HOSPITAL_BASED_OUTPATIENT_CLINIC_OR_DEPARTMENT_OTHER): Payer: Medicare HMO | Admitting: Internal Medicine

## 2022-04-28 DIAGNOSIS — C7931 Secondary malignant neoplasm of brain: Secondary | ICD-10-CM | POA: Diagnosis not present

## 2022-04-28 DIAGNOSIS — C3490 Malignant neoplasm of unspecified part of unspecified bronchus or lung: Secondary | ICD-10-CM

## 2022-04-28 DIAGNOSIS — R569 Unspecified convulsions: Secondary | ICD-10-CM

## 2022-04-28 NOTE — Telephone Encounter (Signed)
Per 10/30 los called and spoke to pt about appointment

## 2022-04-28 NOTE — Progress Notes (Signed)
I connected with Hannah [name] on 04/28/22 at 11:00 AM EDT by telephone visit and verified that I am speaking with the correct person using two identifiers.  I discussed the limitations, risks, security and privacy concerns of performing an evaluation and management service by telemedicine and the availability of in-person appointments. I also discussed with the patient that there may be a patient responsible charge related to this service. The patient expressed understanding and agreed to proceed.  Other persons participating in the visit and their role in the encounter:  daughter  Patient's location:  Home  Provider's location:  Office  Chief Complaint:  Primary malignant neoplasm of lung with metastasis to brain [facility])  Seizure ([place])  History of Present Ilness: Hannah [name] is feeling well, no new or progressive symptoms.  Keppra is at 1000mg  twice per day, vimpat 100mg  twice per day.  No seizures.  Observations: Language and cognition at baseline  Imaging:  [place] Clinician Interpretation: I have personally reviewed the CNS images as listed.  My interpretation, in the context of the patient's clinical presentation, is treatment effect vs true progression  MR BRAIN W WO CONTRAST  Addendum Date: 04/28/2022   ADDENDUM REPORT: 04/28/2022 07:54 ADDENDUM: On further review at brain tumor conference on 04/28/2022, there was a previously treated lesion at the location of the new enhancement in the right temporal lobe on the current study, though the enhancement is still new since the most recent prior. Delayed treatment effect vs recurrent disease at this location are both possible. Continued follow up recommended. Electronically Signed   By: Valetta Mole M.D.   On: 04/28/2022 07:54   Result Date: 04/28/2022 CLINICAL DATA:  History of lung cancer with metastases to brain in 2022. Tumor removal in April 2023. EXAM: MRI HEAD WITHOUT AND WITH CONTRAST TECHNIQUE: Multiplanar, multiecho pulse  sequences of the brain and surrounding structures were obtained without and with intravenous contrast. CONTRAST:  6 cc Vueway COMPARISON:  Brain MRI most recently 11/22/2021 from an outside institution. FINDINGS: Brain: Postsurgical changes reflecting left parietal craniotomy are again seen. There is decreased though not entirely resolved enhancement in the posterior frontal lobe underlying the craniotomy site with corresponding decreased FLAIR signal abnormality. A small amount of postsurgical blood products in this location are similar to the prior studies. The previously treated peripherally enhancing lesion in the left occipital lobe is decreased in size but has persistent peripheral enhancement, currently measuring 1.5 cm x 1.4 cm in the axial plane on image 13-74 (previously measured 2.0 cm x 1.8 cm measured at a similar level in the axial plane). Surrounding edema has also decreased. There is a small amount chronic blood products at the periphery of this lesion. There is a 3 mm focus of ill-defined enhancement in the right temporal lobe with minimal corresponding FLAIR signal abnormality which is new since the prior study (13-77, 16-7). There is no acute intracranial hemorrhage, extra-axial fluid collection, or acute infarct Background parenchymal volume is stable. The ventricles are stable in size. Patchy FLAIR signal abnormality in the supratentorial white matter likely reflecting sequela of chronic small-vessel ischemic change is stable. There is no mass effect or midline shift. Vascular: Normal flow voids. Skull and upper cervical spine: Postsurgical changes reflecting left parietal craniotomy and treatment of the left occipital lobe metastasis as above. No suspicious marrow signal abnormality. Sinuses/Orbits: There is mild mucosal thickening in the paranasal sinuses. The globes and orbits are unremarkable. Other: There is a trace right mastoid effusion, unchanged. IMPRESSION:  1. Decreased size of the  previously treated left occipital lobe and posterior frontal lobe metastases with decreased surrounding edema. 2. New 3 mm focus of ill-defined enhancement in the right temporal lobe with minimal corresponding FLAIR signal abnormality suspicious for a new lesion. Electronically Signed: By: Valetta Mole M.D. On: 04/27/2022 17:33    Assessment and Plan: Primary malignant neoplasm of lung with metastasis to brain [facility])  Seizure ([place])  Clinically stable today.  MRI demonstrates recrudescence of right temporal metastasis previously treated in May 2022.  Etiology is treatment effect vs recurrent tumor.  Recommended continued surveillance.  Will con't Keppra 1000mg  BID, vimpat 100mg  BID  Follow Up Instructions:  We ask that Hannah [name] return to clinic in 3 months following next brain MRI, or sooner as needed.  I discussed the assessment and treatment plan with the patient.  The patient was provided an opportunity to ask questions and all were answered.  The patient agreed with the plan and demonstrated understanding of the instructions.    The patient was advised to call back or seek an in-person evaluation if the symptoms worsen or if the condition fails to improve as anticipated.  I provided 5-10 minutes of non-face-to-face time during this enocunter.  Ventura Sellers, MD   I provided 22 minutes of non face-to-face telephone visit time during this encounter, and > 50% was spent counseling as documented under my assessment & plan.

## 2022-04-29 ENCOUNTER — Other Ambulatory Visit: Payer: Medicare HMO

## 2022-04-29 ENCOUNTER — Ambulatory Visit
Admission: RE | Admit: 2022-04-29 | Discharge: 2022-04-29 | Disposition: A | Discharge status: Home / Self Care | Payer: Medicare HMO | Source: Ambulatory Visit | Attending: Internal Medicine | Admitting: Internal Medicine

## 2022-04-29 ENCOUNTER — Other Ambulatory Visit: Payer: Self-pay | Admitting: Radiation Therapy

## 2022-04-29 DIAGNOSIS — I700 Atherosclerosis of aorta: Secondary | ICD-10-CM | POA: Diagnosis not present

## 2022-04-29 DIAGNOSIS — C3491 Malignant neoplasm of unspecified part of right bronchus or lung: Secondary | ICD-10-CM | POA: Diagnosis not present

## 2022-04-29 DIAGNOSIS — C3490 Malignant neoplasm of unspecified part of unspecified bronchus or lung: Secondary | ICD-10-CM

## 2022-04-29 DIAGNOSIS — J90 Pleural effusion, not elsewhere classified: Secondary | ICD-10-CM | POA: Diagnosis not present

## 2022-04-29 DIAGNOSIS — K8020 Calculus of gallbladder without cholecystitis without obstruction: Secondary | ICD-10-CM | POA: Diagnosis not present

## 2022-04-29 DIAGNOSIS — C7931 Secondary malignant neoplasm of brain: Secondary | ICD-10-CM | POA: Diagnosis not present

## 2022-04-29 DIAGNOSIS — K5730 Diverticulosis of large intestine without perforation or abscess without bleeding: Secondary | ICD-10-CM | POA: Diagnosis not present

## 2022-04-29 MED ORDER — IOPAMIDOL (ISOVUE-300) INJECTION 61%
100.0000 mL | Freq: Once | INTRAVENOUS | Status: AC | PRN
  Administered 2022-04-29: 100 mL via INTRAVENOUS

## 2022-05-01 ENCOUNTER — Inpatient Hospital Stay: Payer: Medicare HMO

## 2022-05-01 ENCOUNTER — Other Ambulatory Visit: Payer: Self-pay | Admitting: Medical Oncology

## 2022-05-01 ENCOUNTER — Inpatient Hospital Stay: Payer: Medicare HMO | Attending: Internal Medicine

## 2022-05-01 ENCOUNTER — Other Ambulatory Visit: Payer: Self-pay | Admitting: Physician Assistant

## 2022-05-01 ENCOUNTER — Inpatient Hospital Stay (HOSPITAL_BASED_OUTPATIENT_CLINIC_OR_DEPARTMENT_OTHER): Payer: Medicare HMO | Admitting: Internal Medicine

## 2022-05-01 ENCOUNTER — Encounter: Payer: Self-pay | Admitting: Internal Medicine

## 2022-05-01 VITALS — BP 134/78 | HR 78 | Temp 98.5°F | Resp 16 | Ht 60.0 in | Wt 133.8 lb

## 2022-05-01 DIAGNOSIS — R042 Hemoptysis: Secondary | ICD-10-CM | POA: Insufficient documentation

## 2022-05-01 DIAGNOSIS — K5730 Diverticulosis of large intestine without perforation or abscess without bleeding: Secondary | ICD-10-CM | POA: Diagnosis not present

## 2022-05-01 DIAGNOSIS — Z5111 Encounter for antineoplastic chemotherapy: Secondary | ICD-10-CM | POA: Diagnosis not present

## 2022-05-01 DIAGNOSIS — Z902 Acquired absence of lung [part of]: Secondary | ICD-10-CM | POA: Insufficient documentation

## 2022-05-01 DIAGNOSIS — Z23 Encounter for immunization: Secondary | ICD-10-CM

## 2022-05-01 DIAGNOSIS — R9230 Dense breasts, unspecified: Secondary | ICD-10-CM | POA: Insufficient documentation

## 2022-05-01 DIAGNOSIS — R0981 Nasal congestion: Secondary | ICD-10-CM | POA: Insufficient documentation

## 2022-05-01 DIAGNOSIS — C3411 Malignant neoplasm of upper lobe, right bronchus or lung: Secondary | ICD-10-CM | POA: Insufficient documentation

## 2022-05-01 DIAGNOSIS — F32A Depression, unspecified: Secondary | ICD-10-CM | POA: Insufficient documentation

## 2022-05-01 DIAGNOSIS — M47814 Spondylosis without myelopathy or radiculopathy, thoracic region: Secondary | ICD-10-CM | POA: Diagnosis not present

## 2022-05-01 DIAGNOSIS — C7931 Secondary malignant neoplasm of brain: Secondary | ICD-10-CM | POA: Insufficient documentation

## 2022-05-01 DIAGNOSIS — C7951 Secondary malignant neoplasm of bone: Secondary | ICD-10-CM | POA: Insufficient documentation

## 2022-05-01 DIAGNOSIS — F419 Anxiety disorder, unspecified: Secondary | ICD-10-CM | POA: Insufficient documentation

## 2022-05-01 DIAGNOSIS — I700 Atherosclerosis of aorta: Secondary | ICD-10-CM | POA: Insufficient documentation

## 2022-05-01 DIAGNOSIS — E042 Nontoxic multinodular goiter: Secondary | ICD-10-CM | POA: Insufficient documentation

## 2022-05-01 DIAGNOSIS — I2510 Atherosclerotic heart disease of native coronary artery without angina pectoris: Secondary | ICD-10-CM | POA: Diagnosis not present

## 2022-05-01 DIAGNOSIS — G939 Disorder of brain, unspecified: Secondary | ICD-10-CM | POA: Diagnosis not present

## 2022-05-01 DIAGNOSIS — Z79899 Other long term (current) drug therapy: Secondary | ICD-10-CM | POA: Insufficient documentation

## 2022-05-01 DIAGNOSIS — E785 Hyperlipidemia, unspecified: Secondary | ICD-10-CM | POA: Insufficient documentation

## 2022-05-01 DIAGNOSIS — M5136 Other intervertebral disc degeneration, lumbar region: Secondary | ICD-10-CM | POA: Insufficient documentation

## 2022-05-01 DIAGNOSIS — K5900 Constipation, unspecified: Secondary | ICD-10-CM | POA: Diagnosis not present

## 2022-05-01 DIAGNOSIS — R059 Cough, unspecified: Secondary | ICD-10-CM | POA: Diagnosis not present

## 2022-05-01 DIAGNOSIS — Z5112 Encounter for antineoplastic immunotherapy: Secondary | ICD-10-CM | POA: Insufficient documentation

## 2022-05-01 DIAGNOSIS — J90 Pleural effusion, not elsewhere classified: Secondary | ICD-10-CM | POA: Insufficient documentation

## 2022-05-01 DIAGNOSIS — R609 Edema, unspecified: Secondary | ICD-10-CM | POA: Diagnosis not present

## 2022-05-01 DIAGNOSIS — Z8719 Personal history of other diseases of the digestive system: Secondary | ICD-10-CM | POA: Insufficient documentation

## 2022-05-01 DIAGNOSIS — K8020 Calculus of gallbladder without cholecystitis without obstruction: Secondary | ICD-10-CM | POA: Diagnosis not present

## 2022-05-01 DIAGNOSIS — R5383 Other fatigue: Secondary | ICD-10-CM | POA: Insufficient documentation

## 2022-05-01 DIAGNOSIS — Z888 Allergy status to other drugs, medicaments and biological substances status: Secondary | ICD-10-CM | POA: Insufficient documentation

## 2022-05-01 DIAGNOSIS — Z87891 Personal history of nicotine dependence: Secondary | ICD-10-CM | POA: Insufficient documentation

## 2022-05-01 DIAGNOSIS — R82998 Other abnormal findings in urine: Secondary | ICD-10-CM | POA: Insufficient documentation

## 2022-05-01 DIAGNOSIS — C3491 Malignant neoplasm of unspecified part of right bronchus or lung: Secondary | ICD-10-CM

## 2022-05-01 DIAGNOSIS — Z90710 Acquired absence of both cervix and uterus: Secondary | ICD-10-CM | POA: Insufficient documentation

## 2022-05-01 DIAGNOSIS — E860 Dehydration: Secondary | ICD-10-CM | POA: Insufficient documentation

## 2022-05-01 DIAGNOSIS — J984 Other disorders of lung: Secondary | ICD-10-CM | POA: Insufficient documentation

## 2022-05-01 LAB — CBC WITH DIFFERENTIAL (CANCER CENTER ONLY)
Abs Immature Granulocytes: 0.01 10*3/uL (ref 0.00–0.07)
Basophils Absolute: 0 10*3/uL (ref 0.0–0.1)
Basophils Relative: 1 %
Eosinophils Absolute: 0 10*3/uL (ref 0.0–0.5)
Eosinophils Relative: 1 %
HCT: 35.4 % — ABNORMAL LOW (ref 36.0–46.0)
Hemoglobin: 11.1 g/dL — ABNORMAL LOW (ref 12.0–15.0)
Immature Granulocytes: 0 %
Lymphocytes Relative: 33 %
Lymphs Abs: 1.3 10*3/uL (ref 0.7–4.0)
MCH: 28.8 pg (ref 26.0–34.0)
MCHC: 31.4 g/dL (ref 30.0–36.0)
MCV: 91.9 fL (ref 80.0–100.0)
Monocytes Absolute: 0.5 10*3/uL (ref 0.1–1.0)
Monocytes Relative: 12 %
Neutro Abs: 2.1 10*3/uL (ref 1.7–7.7)
Neutrophils Relative %: 53 %
Platelet Count: 282 10*3/uL (ref 150–400)
RBC: 3.85 MIL/uL — ABNORMAL LOW (ref 3.87–5.11)
RDW: 13.2 % (ref 11.5–15.5)
WBC Count: 4 10*3/uL (ref 4.0–10.5)
nRBC: 0 % (ref 0.0–0.2)

## 2022-05-01 LAB — CMP (CANCER CENTER ONLY)
ALT: 16 U/L (ref 0–44)
AST: 27 U/L (ref 15–41)
Albumin: 3.8 g/dL (ref 3.5–5.0)
Alkaline Phosphatase: 71 U/L (ref 38–126)
Anion gap: 4 — ABNORMAL LOW (ref 5–15)
BUN: 14 mg/dL (ref 8–23)
CO2: 31 mmol/L (ref 22–32)
Calcium: 9.5 mg/dL (ref 8.9–10.3)
Chloride: 107 mmol/L (ref 98–111)
Creatinine: 0.75 mg/dL (ref 0.44–1.00)
GFR, Estimated: >60 mL/min (ref >60)
Glucose, Bld: 95 mg/dL (ref 70–99)
Potassium: 4.6 mmol/L (ref 3.5–5.1)
Sodium: 142 mmol/L (ref 135–145)
Total Bilirubin: 0.3 mg/dL (ref 0.3–1.2)
Total Protein: 6.8 g/dL (ref 6.5–8.1)

## 2022-05-01 MED ORDER — PROCHLORPERAZINE MALEATE 10 MG PO TABS
10.0000 mg | ORAL_TABLET | Freq: Once | ORAL | Status: AC
  Administered 2022-05-01: 10 mg via ORAL
  Filled 2022-05-01: qty 1

## 2022-05-01 MED ORDER — INFLUENZA VAC A&B SA ADJ QUAD 0.5 ML IM PRSY
0.5000 mL | PREFILLED_SYRINGE | Freq: Once | INTRAMUSCULAR | Status: AC
  Administered 2022-05-01: 0.5 mL via INTRAMUSCULAR
  Filled 2022-05-01: qty 0.5

## 2022-05-01 MED ORDER — LIDOCAINE-PRILOCAINE 2.5-2.5 % EX CREA: 1.0000 | TOPICAL_CREAM | CUTANEOUS | 2 refills | Status: DC | PRN

## 2022-05-01 MED ORDER — SODIUM CHLORIDE 0.9 % IV SOLN
200.0000 mg | Freq: Once | INTRAVENOUS | Status: AC
  Administered 2022-05-01: 200 mg via INTRAVENOUS
  Filled 2022-05-01: qty 200

## 2022-05-01 MED ORDER — SODIUM CHLORIDE 0.9 % IV SOLN: Freq: Once | INTRAVENOUS | Status: AC

## 2022-05-01 MED ORDER — SODIUM CHLORIDE 0.9 % IV SOLN
500.0000 mg/m2 | Freq: Once | INTRAVENOUS | Status: AC
  Administered 2022-05-01: 800 mg via INTRAVENOUS
  Filled 2022-05-01: qty 20

## 2022-05-01 NOTE — Progress Notes (Signed)
[place] Telephone:([phone]) [phone]   Fax:([phone]) [phone]  OFFICE PROGRESS NOTE  [name], MD [place] [address]  DIAGNOSIS: Stage IV non-small cell lung cancer (T3, N0, M1C) adenocarcinoma.  The patient presented with a and solitary brain metastasis.  The patient was diagnosed in February 2022.  Biomarker Findings Microsatellite status - MS-Stable Tumor Mutational Burden - 8 Muts/Mb Genomic Findings For a complete list of the genes assayed, please refer to the Appendix. KRAS G12V KEAP1 S224F TP53 P151T 7 Disease relevant genes with no reportable alterations: ALK, BRAF, EGFR, ERBB2, MET, RET, ROS1  PDL1 Expression: 50%    PRIOR THERAPY: 1) SRS to the metastatic brain lesion on 08/23/2020 under the care of Dr. Tammi Klippel and craniotomy under the care of Dr. Zada Finders scheduled for 08/24/2020. 2) S/p robotic assisted right upper lobectomy with en bloc wedge resection of the right middle lobe and lymph node dissection under the care of Dr. Roxan Hockey on September 24, 2020. 3) status post SRS to a new subcentimeter brain lesions under the care of Dr. Lisbeth Renshaw. 4) SRS to brain metastasis.   CURRENT THERAPY: Systemic chemotherapy with carboplatin for AUC of 5, Alimta 500 Mg/M2 and Keytruda 200 mg IV every 3 weeks.  First dose Nov 07, 2020.  Status post 24 cycles.  Starting from cycle #5 the patient will be on maintenance treatment with Alimta and Keytruda every 3 weeks.  INTERVAL HISTORY: Hannah [name] 71 y.o. female returns to the clinic today for follow-up visit.  She was accompanied by her daughter.  The patient is feeling fine today with no concerning complaints.  She has no chest pain, shortness of breath, cough or hemoptysis.  She has no nausea, vomiting, diarrhea or constipation.  She has no headache or visual changes.  She denied having any recent weight loss or night sweats.  She has been tolerating her treatment with maintenance Alimta and  Keytruda fairly well.  She had repeat CT scan of the chest, abdomen and pelvis performed recently and she is here for evaluation and discussion of her risk her results.  MEDICAL HISTORY: Past Medical History:  Diagnosis Date   Allergy    Anemia    Anxiety    Arthritis    Carpal tunnel syndrome    Constipation    senna C stool softeners help    Depression    Diverticulosis    Dyslipidemia    External hemorrhoids    GERD (gastroesophageal reflux disease)    Heart murmur    mild-moderate AR   Hiatal hernia    Hyperlipidemia    on meds    Hypertension    Internal hemorrhoids    lung ca with brain mets 07/2020   Osteoarthritis    Pre-diabetes    PVD (peripheral vascular disease) (HCC)    moderate carotid disease   RBBB    Smoker    Vocal cord polyps     ALLERGIES:  is allergic to amlodipine, chantix [varenicline tartrate], clarithromycin, lisinopril, simvastatin, wellbutrin [bupropion hcl], lipitor [atorvastatin], and sertraline.  MEDICATIONS:  Current Outpatient Medications  Medication Sig Dispense Refill   acetaminophen (TYLENOL) 325 MG tablet Take 325 mg by mouth every 6 (six) hours as needed for moderate pain.     ALPRAZolam (XANAX XR) 0.5 MG 24 hr tablet Take 1 tablet (0.5 mg total) by mouth daily. 30 tablet 5   ALPRAZolam (XANAX) 0.5 MG tablet Take 1 tablet (0.5 mg total) by  mouth 3 (three) times daily as needed. for anxiety 90 tablet 0   aspirin 81 MG tablet Take 1 tablet (81 mg total) by mouth daily. Restart on 08/31/20 30 tablet    bisacodyl (DULCOLAX) 5 MG EC tablet Take 5 mg by mouth daily as needed for moderate constipation.     carboxymethylcellulose (REFRESH PLUS) 0.5 % SOLN Place 1-2 drops into both eyes 3 (three) times daily as needed (dry eyes).     Cholecalciferol (D3 PO) Take 1 capsule by mouth daily.     citalopram (CELEXA) 10 MG tablet Take 1 tablet by mouth once daily 90 tablet 0   fluticasone (FLONASE) 50 MCG/ACT nasal spray PLACE 2 SPRAYS INTO BOTH  NOSTRILS DAILY. (Patient taking differently: Place 2 sprays into both nostrils daily as needed for allergies.) 48 g 1   folic acid (FOLVITE) 1 MG tablet Take 1 tablet (1 mg total) by mouth daily. 30 tablet 3   Lacosamide 100 MG TABS Take 1 tablet (100 mg total) by mouth 2 (two) times daily. 60 tablet 3   levETIRAcetam (KEPPRA) 100 MG/ML solution TAKE 15 ML BY MOUTH  TWICE DAILY 473 mL 0   loratadine (CLARITIN) 10 MG tablet Take 10 mg by mouth daily as needed for allergies.     magnesium hydroxide (MILK OF MAGNESIA) 800 MG/5ML suspension Take 30 mLs by mouth daily as needed for constipation.     Menthol, Topical Analgesic, (BIOFREEZE EX) Apply 1 application topically as needed (pain).     mirtazapine (REMERON) 15 MG tablet Take 0.5 tablets (7.5 mg total) by mouth at bedtime. 90 tablet 2   olopatadine (PATANOL) 0.1 % ophthalmic solution Place 1 drop into both eyes 2 (two) times daily as needed for allergies.     omeprazole (PRILOSEC) 40 MG capsule TAKE 1 CAPSULE TWICE DAILY BEFORE MEALS (Patient taking differently: Take 40 mg by mouth daily.) 180 capsule 0   polyethylene glycol (MIRALAX / GLYCOLAX) 17 g packet Take 17 g by mouth 2 (two) times daily. 72 each 0   pravastatin (PRAVACHOL) 40 MG tablet Take 1 tablet (40 mg total) by mouth every evening. 90 tablet 3   Probiotic Product (PROBIOTIC DAILY PO) Take 1 capsule by mouth daily.     prochlorperazine (COMPAZINE) 10 MG tablet TAKE 1 TABLET BY MOUTH EVERY 6 HOURS AS NEEDED FOR NAUSEA OR VOMITING 30 tablet 0   simethicone (MYLICON) 80 MG chewable tablet Chew 1 tablet (80 mg total) by mouth 4 (four) times daily as needed for flatulence (Bloating). 30 tablet 0   No current facility-administered medications for this visit.    SURGICAL HISTORY:  Past Surgical History:  Procedure Laterality Date   ABDOMINAL HYSTERECTOMY  4734   APPLICATION OF CRANIAL NAVIGATION N/A 08/24/2020   Procedure: APPLICATION OF CRANIAL NAVIGATION;  Surgeon: Judith Part, MD;  Location: [facility];  Service: Neurosurgery;  Laterality: N/A;   COLONOSCOPY     COLONOSCOPY W/ POLYPECTOMY  2009   CRANIOTOMY Left 08/24/2020   Procedure: Left Craniotomy for Tumor Resection with Brainlab;  Surgeon: Judith Part, MD;  Location: [facility];  Service: Neurosurgery;  Laterality: Left;   HEMORRHOID SURGERY     INTERCOSTAL NERVE BLOCK Right 09/24/2020   Procedure: INTERCOSTAL NERVE BLOCK;  Surgeon: Melrose Nakayama, MD;  Location: [facility];  Service: Thoracic;  Laterality: Right;   JOINT REPLACEMENT     LYMPH NODE DISSECTION Right 09/24/2020   Procedure: LYMPH NODE DISSECTION;  Surgeon: Melrose Nakayama, MD;  Location: [facility];  Service: Thoracic;  Laterality: Right;   POLYPECTOMY     right total knee arthroplasty     Dr. Emeterio Reeve 06-04-18   [place]  08/16/2009   TOTAL KNEE ARTHROPLASTY Left 02/13/2014   Procedure: TOTAL KNEE ARTHROPLASTY;  Surgeon: Alta Corning, MD;  Location: [facility];  Service: Orthopedics;  Laterality: Left;   TOTAL KNEE ARTHROPLASTY Right 06/04/2018   Procedure: RIGHT TOTAL KNEE ARTHROPLASTY;  Surgeon: Dorna Leitz, MD;  Location: [facility];  Service: Orthopedics;  Laterality: Right;  Adductor Block   TUBAL LIGATION      REVIEW OF SYSTEMS:  Constitutional: negative Eyes: negative Ears, nose, mouth, throat, and face: negative Respiratory: negative Cardiovascular: negative Gastrointestinal: negative Genitourinary:negative Integument/breast: negative Hematologic/lymphatic: negative Musculoskeletal:negative Neurological: negative Behavioral/Psych: negative Endocrine: negative Allergic/Immunologic: negative   PHYSICAL EXAMINATION: General appearance: alert, cooperative, and no distress Head: Normocephalic, without obvious abnormality, atraumatic Neck: no adenopathy, no JVD, supple, symmetrical, trachea midline, and thyroid not enlarged, symmetric, no tenderness/mass/nodules Lymph nodes: Cervical, supraclavicular, and axillary nodes  normal. Resp: clear to auscultation bilaterally Back: symmetric, no curvature. ROM normal. No CVA tenderness. Cardio: regular rate and rhythm, S1, S2 normal, no murmur, click, rub or gallop GI: soft, non-tender; bowel sounds normal; no masses,  no organomegaly Extremities: extremities normal, atraumatic, no cyanosis or edema Neurologic: Alert and oriented X 3, normal strength and tone. Normal symmetric reflexes. Normal coordination and gait  ECOG PERFORMANCE STATUS: 1 - Symptomatic but completely ambulatory  Blood pressure 134/78, pulse 78, temperature 98.5 F (36.9 C), temperature source Oral, resp. rate 16, height 5' (1.524 m), weight 133 lb 12.8 oz (60.7 kg), SpO2 99 %.  LABORATORY DATA: Lab Results  Component Value Date   WBC 4.0 05/01/2022   HGB 11.1 (L) 05/01/2022   HCT 35.4 (L) 05/01/2022   MCV 91.9 05/01/2022   PLT 282 05/01/2022      Chemistry      Component Value Date/Time   NA 142 05/01/2022 0939   K 4.6 05/01/2022 0939   CL 107 05/01/2022 0939   CO2 31 05/01/2022 0939   BUN 14 05/01/2022 0939   CREATININE 0.75 05/01/2022 0939   CREATININE 0.79 03/14/2020 1016      Component Value Date/Time   CALCIUM 9.5 05/01/2022 0939   ALKPHOS 71 05/01/2022 0939   AST 27 05/01/2022 0939   ALT 16 05/01/2022 0939   BILITOT 0.3 05/01/2022 0939       RADIOGRAPHIC STUDIES: MR BRAIN W WO CONTRAST  Addendum Date: 04/28/2022   ADDENDUM REPORT: 04/28/2022 07:54 ADDENDUM: On further review at brain tumor conference on 04/28/2022, there was a previously treated lesion at the location of the new enhancement in the right temporal lobe on the current study, though the enhancement is still new since the most recent prior. Delayed treatment effect vs recurrent disease at this location are both possible. Continued follow up recommended. Electronically Signed   By: Valetta Mole M.D.   On: 04/28/2022 07:54   Result Date: 04/28/2022 CLINICAL DATA:  History of lung cancer with metastases  to brain in 2022. Tumor removal in April 2023. EXAM: MRI HEAD WITHOUT AND WITH CONTRAST TECHNIQUE: Multiplanar, multiecho pulse sequences of the brain and surrounding structures were obtained without and with intravenous contrast. CONTRAST:  6 cc Vueway COMPARISON:  Brain MRI most recently 11/22/2021 from an outside institution. FINDINGS: Brain: Postsurgical changes reflecting left parietal craniotomy are again seen. There is decreased though not entirely resolved enhancement in the posterior frontal lobe  underlying the craniotomy site with corresponding decreased FLAIR signal abnormality. A small amount of postsurgical blood products in this location are similar to the prior studies. The previously treated peripherally enhancing lesion in the left occipital lobe is decreased in size but has persistent peripheral enhancement, currently measuring 1.5 cm x 1.4 cm in the axial plane on image 13-74 (previously measured 2.0 cm x 1.8 cm measured at a similar level in the axial plane). Surrounding edema has also decreased. There is a small amount chronic blood products at the periphery of this lesion. There is a 3 mm focus of ill-defined enhancement in the right temporal lobe with minimal corresponding FLAIR signal abnormality which is new since the prior study (13-77, 16-7). There is no acute intracranial hemorrhage, extra-axial fluid collection, or acute infarct Background parenchymal volume is stable. The ventricles are stable in size. Patchy FLAIR signal abnormality in the supratentorial white matter likely reflecting sequela of chronic small-vessel ischemic change is stable. There is no mass effect or midline shift. Vascular: Normal flow voids. Skull and upper cervical spine: Postsurgical changes reflecting left parietal craniotomy and treatment of the left occipital lobe metastasis as above. No suspicious marrow signal abnormality. Sinuses/Orbits: There is mild mucosal thickening in the paranasal sinuses. The globes  and orbits are unremarkable. Other: There is a trace right mastoid effusion, unchanged. IMPRESSION: 1. Decreased size of the previously treated left occipital lobe and posterior frontal lobe metastases with decreased surrounding edema. 2. New 3 mm focus of ill-defined enhancement in the right temporal lobe with minimal corresponding FLAIR signal abnormality suspicious for a new lesion. Electronically Signed: By: Valetta Mole M.D. On: 04/27/2022 17:33     ASSESSMENT AND PLAN: This is a very pleasant 71 years old African-American female diagnosed with stage IV (T3, N0, M1b) non-small cell lung cancer, adenocarcinoma presented with right upper lobe lung mass with solitary brain metastasis diagnosed in February 2022 status post SRS to the brain lesion followed by craniotomy and resection. The patient also has a solitary lung mass. S/p robotic assisted right upper lobectomy with en bloc wedge resection of the right middle lobe and lymph node dissection under the care of Dr. Roxan Hockey on September 24, 2020. She also underwent SRS treatment to a new subcentimeter brain lesions under the care of Dr. Lisbeth Renshaw She is currently undergoing systemic chemotherapy with carboplatin for AUC of 5, Alimta 500 Mg/M2 and Keytruda 200 Mg IV every 3 weeks status post 24 cycles.  Starting from cycle #5 the patient will be on maintenance treatment with Alimta and Keytruda every 3 weeks. The patient has been tolerating her treatment fairly well. She had repeat CT scan of the chest, abdomen and pelvis performed recently.  I personally and independently reviewed the scan images and discussed the result with the patient and her daughter.  The final report is still pending.  I did not see any concerning finding for progression but will wait for the final report of radiology for confirmation. I recommended for her to proceed with cycle #25 of her treatment today as planned. For the brain metastasis, she has new 3 mm focus of enhancement in  the right temporal lobe that need to be monitored closely and she is followed by neurooncology and radiation oncology. She will come back for follow-up visit in 3 weeks before the next cycle of her treatment. The patient was advised to call immediately if she has any other concerning symptoms in the interval. The patient voices understanding of current disease status  and treatment options and is in agreement with the current care plan.  All questions were answered. The patient knows to call the clinic with any problems, questions or concerns. We can certainly see the patient much sooner if necessary.  Disclaimer: This note was dictated with voice recognition software. Similar sounding words can inadvertently be transcribed and may not be corrected upon review.

## 2022-05-02 ENCOUNTER — Other Ambulatory Visit: Payer: Self-pay | Admitting: Internal Medicine

## 2022-05-02 DIAGNOSIS — C3411 Malignant neoplasm of upper lobe, right bronchus or lung: Secondary | ICD-10-CM

## 2022-05-06 ENCOUNTER — Ambulatory Visit: Payer: Medicare HMO | Attending: Cardiovascular Disease | Admitting: Cardiovascular Disease

## 2022-05-06 ENCOUNTER — Encounter: Payer: Self-pay | Admitting: Cardiovascular Disease

## 2022-05-06 VITALS — BP 118/62 | HR 102 | Ht 60.0 in | Wt 131.6 lb

## 2022-05-06 DIAGNOSIS — I739 Peripheral vascular disease, unspecified: Secondary | ICD-10-CM | POA: Diagnosis not present

## 2022-05-06 NOTE — Assessment & Plan Note (Signed)
Hannah [name] was referred to me by Dr. Percival Spanish for evaluation of PAD.  She has seen Dr. Carlis Abbott as well, vascular surgeon on 03/11/2022 as a referral from her PCP.  Dr. Carlis Abbott recommended conservative therapy including Pletal which she did not tolerate.  She said claudication for the last several months in her left leg.  Doppler studies performed 03/07/2022 revealed a right ABI of 1.06 and a left of 0.87.  She did have a mildly elevated velocity lesion in her proximal left SFA and popliteal artery.  At this point, she does not wish an invasive approach.  We will follow her Doppler studies in 1 year and I will see her back after that.  Should she decide to pursue endovascular therapy I am happy to see her back sooner.

## 2022-05-06 NOTE — Progress Notes (Signed)
05/06/2022 Hannah [name]   03/29/1951  [id number]  Primary Physician [name], MD Primary Cardiologist: Lorretta Harp MD Lupe Carney, [place]  HPI:  Hannah [name] is a 71 y.o. thin-appearing married African-American female mother of 3 children, grandmother of 3 grandchildren referred by Dr. Percival Spanish for peripheral vascular valuation because of lifestyle limiting claudication in her left leg.  She is retired from being an Programmer, systems.  Risk factors include 120-pack-year tobacco abuse having quit 2 years ago when she was diagnosed with lung cancer with metastasis to her brain.  She had neurosurgery to remove this and is on chemotherapy.  She also has treated hyperlipidemia.  There is no family history for heart disease.  She is never had a heart attack or stroke.  She denies chest pain or shortness of breath.  She developed left lower extremity claudication over the last several months and saw Dr. Fortunato Curling who recommended conservative therapy including Pletal which unfortunately she did not tolerate.  Doppler studies performed 03/07/2022 revealed a right ABI of 1.06, left of 0.87 with moderately elevated velocity in her proximal left SFA and popliteal arteries.   Current Meds  Medication Sig   acetaminophen (TYLENOL) 325 MG tablet Take 325 mg by mouth every 6 (six) hours as needed for moderate pain.   ALPRAZolam (XANAX XR) 0.5 MG 24 hr tablet Take 1 tablet (0.5 mg total) by mouth daily.   ALPRAZolam (XANAX) 0.5 MG tablet Take 1 tablet (0.5 mg total) by mouth 3 (three) times daily as needed. for anxiety   aspirin 81 MG tablet Take 1 tablet (81 mg total) by mouth daily. Restart on 08/31/20   bisacodyl (DULCOLAX) 5 MG EC tablet Take 5 mg by mouth daily as needed for moderate constipation.   carboxymethylcellulose (REFRESH PLUS) 0.5 % SOLN Place 1-2 drops into both eyes 3 (three) times daily as needed (dry eyes).   Cholecalciferol (D3 PO) Take 1 capsule by mouth daily.   citalopram  (CELEXA) 10 MG tablet Take 1 tablet by mouth once daily   fluticasone (FLONASE) 50 MCG/ACT nasal spray PLACE 2 SPRAYS INTO BOTH NOSTRILS DAILY. (Patient taking differently: Place 2 sprays into both nostrils daily as needed for allergies.)   folic acid (FOLVITE) 1 MG tablet Take 1 tablet by mouth once daily   Lacosamide 100 MG TABS Take 1 tablet (100 mg total) by mouth 2 (two) times daily.   levETIRAcetam (KEPPRA) 100 MG/ML solution TAKE 15 ML BY MOUTH  TWICE DAILY   lidocaine-prilocaine (EMLA) cream Apply 1 Application topically as needed.   loratadine (CLARITIN) 10 MG tablet Take 10 mg by mouth daily as needed for allergies.   magnesium hydroxide (MILK OF MAGNESIA) 800 MG/5ML suspension Take 30 mLs by mouth daily as needed for constipation.   Menthol, Topical Analgesic, (BIOFREEZE EX) Apply 1 application topically as needed (pain).   mirtazapine (REMERON) 15 MG tablet Take 0.5 tablets (7.5 mg total) by mouth at bedtime.   olopatadine (PATANOL) 0.1 % ophthalmic solution Place 1 drop into both eyes 2 (two) times daily as needed for allergies.   omeprazole (PRILOSEC) 40 MG capsule TAKE 1 CAPSULE TWICE DAILY BEFORE MEALS (Patient taking differently: Take 40 mg by mouth daily.)   polyethylene glycol (MIRALAX / GLYCOLAX) 17 g packet Take 17 g by mouth 2 (two) times daily.   pravastatin (PRAVACHOL) 40 MG tablet Take 1 tablet (40 mg total) by mouth every evening.   Probiotic Product (PROBIOTIC DAILY PO)  Take 1 capsule by mouth daily.   prochlorperazine (COMPAZINE) 10 MG tablet TAKE 1 TABLET BY MOUTH EVERY 6 HOURS AS NEEDED FOR NAUSEA OR VOMITING   simethicone (MYLICON) 80 MG chewable tablet Chew 1 tablet (80 mg total) by mouth 4 (four) times daily as needed for flatulence (Bloating).     Allergies  Allergen Reactions   Amlodipine Swelling and Rash    Rash, swelling   Chantix [Varenicline Tartrate] Shortness Of Breath, Swelling and Other (See Comments)    Tongue swell,sob   Clarithromycin Rash    Lisinopril Hives   Simvastatin Hives   Wellbutrin [Bupropion Hcl] Hives   Lipitor [Atorvastatin]     Dizziness per patient   Sertraline     Makes her feel like she is going to kill someone    Social History   Socioeconomic History   Marital status: Married    Spouse name: Not on file   Number of children: 2   Years of education: Not on file   Highest education level: Not on file  Occupational History   Not on file  Tobacco Use   Smoking status: Former    Packs/day: 0.25    Years: 40.00    Total pack years: 10.00    Types: Cigarettes    Quit date: 07/09/2020    Years since quitting: 1.8    Passive exposure: Never   Smokeless tobacco: Never   Tobacco comments:    PACK WILL LAST 3 days  Vaping Use   Vaping Use: Never used  Substance and Sexual Activity   Alcohol use: No   Drug use: No   Sexual activity: Not Currently  Other Topics Concern   Not on file  Social History Narrative   Not on file   Social Determinants of Health   Financial Resource Strain: Low Risk  (04/14/2022)   Overall Financial Resource Strain (CARDIA)    Difficulty of Paying Living Expenses: Not hard at all  Food Insecurity: No Food Insecurity (04/14/2022)   Hunger Vital Sign    Worried About Running Out of Food in the Last Year: Never true    Ran Out of Food in the Last Year: Never true  Transportation Needs: No Transportation Needs (04/14/2022)   PRAPARE - Hydrologist (Medical): No    Lack of Transportation (Non-Medical): No  Physical Activity: Insufficiently Active (04/14/2022)   Exercise Vital Sign    Days of Exercise per Week: 2 days    Minutes of Exercise per Session: 60 min  Stress: No Stress Concern Present (04/14/2022)   [place]    Feeling of Stress : Not at all  Social Connections: [place] (04/14/2022)   Social Connection and Isolation Panel [NHANES]    Frequency of  Communication with Friends and Family: More than three times a week    Frequency of Social Gatherings with Friends and Family: More than three times a week    Attends Religious Services: More than 4 times per year    Active Member of [employer] or Organizations: Yes    Attends Archivist Meetings: More than 4 times per year    Marital Status: Married  Human resources officer Violence: Not At Risk (04/14/2022)   Humiliation, Afraid, Rape, and Kick questionnaire    Fear of Current or Ex-Partner: No    Emotionally Abused: No    Physically Abused: No    Sexually Abused: No  Review of Systems: General: negative for chills, fever, night sweats or weight changes.  Cardiovascular: negative for chest pain, dyspnea on exertion, edema, orthopnea, palpitations, paroxysmal nocturnal dyspnea or shortness of breath Dermatological: negative for rash Respiratory: negative for cough or wheezing Urologic: negative for hematuria Abdominal: negative for nausea, vomiting, diarrhea, bright red blood per rectum, melena, or hematemesis Neurologic: negative for visual changes, syncope, or dizziness All other systems reviewed and are otherwise negative except as noted above.    Blood pressure 118/62, pulse (!) 102, height 5' (1.524 m), weight 131 lb 9.6 oz (59.7 kg), SpO2 96 %.  General appearance: alert and no distress Neck: no adenopathy, no carotid bruit, no JVD, supple, symmetrical, trachea midline, and thyroid not enlarged, symmetric, no tenderness/mass/nodules Lungs: clear to auscultation bilaterally Heart: Soft outflow tract murmur Extremities: extremities normal, atraumatic, no cyanosis or edema Pulses: 2+ and symmetric Skin: Skin color, texture, turgor normal. No rashes or lesions Neurologic: Grossly normal  EKG not performed today  ASSESSMENT AND PLAN:   PVD - bilateral 60-79% carotid strenosis Ms. Driskill was referred to me by Dr. Percival Spanish for evaluation of PAD.  She has seen Dr. Carlis Abbott as  well, vascular surgeon on 03/11/2022 as a referral from her PCP.  Dr. Carlis Abbott recommended conservative therapy including Pletal which she did not tolerate.  She said claudication for the last several months in her left leg.  Doppler studies performed 03/07/2022 revealed a right ABI of 1.06 and a left of 0.87.  She did have a mildly elevated velocity lesion in her proximal left SFA and popliteal artery.  At this point, she does not wish an invasive approach.  We will follow her Doppler studies in 1 year and I will see her back after that.  Should she decide to pursue endovascular therapy I am happy to see her back sooner.     Lorretta Harp MD FACP,FACC,FAHA, [facility] 05/06/2022 2:56 PM

## 2022-05-06 NOTE — Patient Instructions (Signed)
Medication Instructions:  Your physician recommends that you continue on your current medications as directed. Please refer to the Current Medication list given to you today.  *If you need a refill on your cardiac medications before your next appointment, please call your pharmacy*   Testing/Procedures: Your physician has requested that you have a lower extremity arterial duplex. During this test, ultrasound is used to evaluate arterial blood flow in the legs. Allow one hour for this exam. There are no restrictions or special instructions. To be done in September 2024. This will take place at [address], [address].   Your physician has requested that you have an ankle brachial index (ABI). During this test an ultrasound and blood pressure cuff are used to evaluate the arteries that supply the arms and legs with blood. Allow thirty minutes for this exam. There are no restrictions or special instructions. To be done in September 2024. This will take place at [place], [address].      Follow-Up: At [facility], you and your health needs are our priority.  As part of our continuing mission to provide you with exceptional heart care, we have created designated Provider Care Teams.  These Care Teams include your primary Cardiologist (physician) and Advanced Practice Providers (APPs -  Physician Assistants and Nurse Practitioners) who all work together to provide you with the care you need, when you need it.  We recommend signing up for the patient portal called "MyChart".  Sign up information is provided on this After Visit Summary.  MyChart is used to connect with patients for Virtual Visits (Telemedicine).  Patients are able to view lab/test results, encounter notes, upcoming appointments, etc.  Non-urgent messages can be sent to your provider as well.   To learn more about what you can do with MyChart, go to [email or web address].    Your next appointment:   12  month(s)  The format for your next appointment:   In Person  Provider:   Quay Burow, MD

## 2022-05-12 ENCOUNTER — Telehealth: Payer: Self-pay | Admitting: Medical Oncology

## 2022-05-12 NOTE — Telephone Encounter (Signed)
Hemoptysis-Dtr reported pt had two episodes of "coughing up blood". She said Eduardo described one as blood clot the size of a quarter , the other was small amount on the tissue.  Dtr reported pt feels good and doing fine, denies chest pain , worsening sob, dizzy or lightheaded. Next appt 11/22.

## 2022-05-13 ENCOUNTER — Encounter: Payer: Self-pay | Admitting: Internal Medicine

## 2022-05-13 NOTE — Telephone Encounter (Signed)
Notified of message below

## 2022-05-13 NOTE — H&P (Signed)
Chief Complaint: Patient was seen in consultation today for metastatic lung cancer  at the request of Cassandra Heilingoetter, PA  Referring Physician(s): Dealer, PA  Supervising Physician: Sandi Mariscal  Patient Status: [facility] - Out-pt  History of Present Illness: Hannah [name] is a 71 y.o. female with PMH of non-small cell lung adenocarcinoma with metastasis to the brain initially diagnosed February 2022. Patient completed SRS followed by craniotomy and resection on 08/24/2020 and robot assisted right upper lobectomy and lymph node dissection 09/24/2020. She is followed and treated by Dr. Julien Nordmann.  Patient has been referred to IR for tunneled catheter with port placement due to poor peripheral access and need for continued treatment.  Past Medical History:  Diagnosis Date   Allergy    Anemia    Anxiety    Arthritis    Carpal tunnel syndrome    Constipation    senna C stool softeners help    Depression    Diverticulosis    Dyslipidemia    External hemorrhoids    GERD (gastroesophageal reflux disease)    Heart murmur    mild-moderate AR   Hiatal hernia    Hyperlipidemia    on meds    Hypertension    Internal hemorrhoids    lung ca with brain mets 07/2020   Osteoarthritis    Pre-diabetes    PVD (peripheral vascular disease) (HCC)    moderate carotid disease   RBBB    Smoker    Vocal cord polyps     Past Surgical History:  Procedure Laterality Date   ABDOMINAL HYSTERECTOMY  9323   APPLICATION OF CRANIAL NAVIGATION N/A 08/24/2020   Procedure: APPLICATION OF CRANIAL NAVIGATION;  Surgeon: Judith Part, MD;  Location: [place];  Service: Neurosurgery;  Laterality: N/A;   COLONOSCOPY     COLONOSCOPY W/ POLYPECTOMY  2009   CRANIOTOMY Left 08/24/2020   Procedure: Left Craniotomy for Tumor Resection with Brainlab;  Surgeon: Judith Part, MD;  Location: [place];  Service: Neurosurgery;  Laterality: Left;   HEMORRHOID SURGERY     INTERCOSTAL NERVE  BLOCK Right 09/24/2020   Procedure: INTERCOSTAL NERVE BLOCK;  Surgeon: Melrose Nakayama, MD;  Location: [place];  Service: Thoracic;  Laterality: Right;   JOINT REPLACEMENT     LYMPH NODE DISSECTION Right 09/24/2020   Procedure: LYMPH NODE DISSECTION;  Surgeon: Melrose Nakayama, MD;  Location: [place];  Service: Thoracic;  Laterality: Right;   POLYPECTOMY     right total knee arthroplasty     Dr. Emeterio Reeve 06-04-18   [place]  08/16/2009   TOTAL KNEE ARTHROPLASTY Left 02/13/2014   Procedure: TOTAL KNEE ARTHROPLASTY;  Surgeon: Alta Corning, MD;  Location: [place];  Service: Orthopedics;  Laterality: Left;   TOTAL KNEE ARTHROPLASTY Right 06/04/2018   Procedure: RIGHT TOTAL KNEE ARTHROPLASTY;  Surgeon: Dorna Leitz, MD;  Location: [facility];  Service: Orthopedics;  Laterality: Right;  Adductor Block   TUBAL LIGATION      Allergies: Amlodipine, Chantix [varenicline tartrate], Clarithromycin, Lisinopril, Simvastatin, Wellbutrin [bupropion hcl], Lipitor [atorvastatin], and Sertraline  Medications: Prior to Admission medications   Medication Sig Start Date End Date Taking? Authorizing Provider  acetaminophen (TYLENOL) 325 MG tablet Take 325 mg by mouth every 6 (six) hours as needed for moderate pain.    [provider]  ALPRAZolam (XANAX XR) 0.5 MG 24 hr tablet Take 1 tablet (0.5 mg total) by mouth daily. 01/27/22   [name], MD  ALPRAZolam Duanne Moron)  0.5 MG tablet Take 1 tablet (0.5 mg total) by mouth 3 (three) times daily as needed. for anxiety 03/21/22   [name], MD  aspirin 81 MG tablet Take 1 tablet (81 mg total) by mouth daily. Restart on 08/31/20 08/25/20   Judith Part, MD  bisacodyl (DULCOLAX) 5 MG EC tablet Take 5 mg by mouth daily as needed for moderate constipation.    [provider]  carboxymethylcellulose (REFRESH PLUS) 0.5 % SOLN Place 1-2 drops into both eyes 3 (three) times daily as needed (dry eyes).    [provider]  Cholecalciferol (D3  PO) Take 1 capsule by mouth daily.    [provider]  citalopram (CELEXA) 10 MG tablet Take 1 tablet by mouth once daily 04/09/22   [name], MD  fluticasone (FLONASE) 50 MCG/ACT nasal spray PLACE 2 SPRAYS INTO BOTH NOSTRILS DAILY. Patient taking differently: Place 2 sprays into both nostrils daily as needed for allergies. 09/05/21   [name], MD  folic acid (FOLVITE) 1 MG tablet Take 1 tablet by mouth once daily 05/02/22   Vaslow, Acey Lav, MD  Lacosamide 100 MG TABS Take 1 tablet (100 mg total) by mouth 2 (two) times daily. 12/26/21   Ventura Sellers, MD  levETIRAcetam (KEPPRA) 100 MG/ML solution TAKE 15 ML BY MOUTH  TWICE DAILY 04/24/22   Ventura Sellers, MD  lidocaine-prilocaine (EMLA) cream Apply 1 Application topically as needed. 05/01/22   Heilingoetter, Cassandra L, PA-C  loratadine (CLARITIN) 10 MG tablet Take 10 mg by mouth daily as needed for allergies.    [provider]  magnesium hydroxide (MILK OF MAGNESIA) 800 MG/5ML suspension Take 30 mLs by mouth daily as needed for constipation.    [provider]  Menthol, Topical Analgesic, (BIOFREEZE EX) Apply 1 application topically as needed (pain).    [provider]  mirtazapine (REMERON) 15 MG tablet Take 0.5 tablets (7.5 mg total) by mouth at bedtime. 03/21/22   [name], MD  olopatadine (PATANOL) 0.1 % ophthalmic solution Place 1 drop into both eyes 2 (two) times daily as needed for allergies.    [provider]  omeprazole (PRILOSEC) 40 MG capsule TAKE 1 CAPSULE TWICE DAILY BEFORE MEALS Patient taking differently: Take 40 mg by mouth daily. 09/09/21   [name], MD  polyethylene glycol (MIRALAX / GLYCOLAX) 17 g packet Take 17 g by mouth 2 (two) times daily. 08/19/20   Eugenie Filler, MD  pravastatin (PRAVACHOL) 40 MG tablet Take 1 tablet (40 mg total) by mouth every evening. 03/21/22   [name], MD  Probiotic Product (PROBIOTIC DAILY PO) Take 1 capsule by mouth  daily.    [provider]  prochlorperazine (COMPAZINE) 10 MG tablet TAKE 1 TABLET BY MOUTH EVERY 6 HOURS AS NEEDED FOR NAUSEA OR VOMITING 11/09/21   Curt Bears, MD  simethicone (MYLICON) 80 MG chewable tablet Chew 1 tablet (80 mg total) by mouth 4 (four) times daily as needed for flatulence (Bloating). 09/28/20   Elgie Collard, PA-C     Family History  Problem Relation Age of Onset   Cancer Father    Prostate cancer Father    Diabetes Sister    Cancer Sister    Stroke Maternal Grandfather    Colitis Maternal Aunt    Breast cancer Maternal Aunt    Colon cancer Neg Hx    Colon polyps Neg Hx    Rectal cancer Neg Hx  Stomach cancer Neg Hx    Esophageal cancer Neg Hx     Social History   Socioeconomic History   Marital status: Married    Spouse name: Not on file   Number of children: 2   Years of education: Not on file   Highest education level: Not on file  Occupational History   Not on file  Tobacco Use   Smoking status: Former    Packs/day: 0.25    Years: 40.00    Total pack years: 10.00    Types: Cigarettes    Quit date: 07/09/2020    Years since quitting: 1.8    Passive exposure: Never   Smokeless tobacco: Never   Tobacco comments:    PACK WILL LAST 3 days  Vaping Use   Vaping Use: Never used  Substance and Sexual Activity   Alcohol use: No   Drug use: No   Sexual activity: Not Currently  Other Topics Concern   Not on file  Social History Narrative   Not on file   Social Determinants of Health   Financial Resource Strain: Low Risk  (04/14/2022)   Overall Financial Resource Strain (CARDIA)    Difficulty of Paying Living Expenses: Not hard at all  Food Insecurity: No Food Insecurity (04/14/2022)   Hunger Vital Sign    Worried About Running Out of Food in the Last Year: Never true    [place] in the Last Year: Never true  Transportation Needs: No Transportation Needs (04/14/2022)   PRAPARE - Hydrologist  (Medical): No    Lack of Transportation (Non-Medical): No  Physical Activity: Insufficiently Active (04/14/2022)   Exercise Vital Sign    Days of Exercise per Week: 2 days    Minutes of Exercise per Session: 60 min  Stress: No Stress Concern Present (04/14/2022)   [place]    Feeling of Stress : Not at all  Social Connections: J[place] (04/14/2022)   Social Connection and Isolation Panel [NHANES]    Frequency of Communication with Friends and Family: More than three times a week    Frequency of Social Gatherings with Friends and Family: More than three times a week    Attends Religious Services: More than 4 times per year    Active Member of [employer] or Organizations: Yes    Attends Music therapist: More than 4 times per year    Marital Status: Married    Review of Systems: A 12 point ROS discussed and pertinent positives are indicated in the HPI above.  All other systems are negative.  Review of Systems  Vital Signs: There were no vitals taken for this visit.    Physical Exam  Imaging: CT Abdomen Pelvis W Contrast  Result Date: 05/01/2022 CLINICAL DATA:  Stage IV non-small cell right lung cancer with brain metastasis with ongoing maintenance chemotherapy. Restaging. * Tracking Code: BO * EXAM: CT CHEST, ABDOMEN, AND PELVIS WITH CONTRAST TECHNIQUE: Multidetector CT imaging of the chest, abdomen and pelvis was performed following the standard protocol during bolus administration of intravenous contrast. RADIATION DOSE REDUCTION: This exam was performed according to the departmental dose-optimization program which includes automated exposure control, adjustment of the mA and/or kV according to patient size and/or use of iterative reconstruction technique. CONTRAST:  112mL ISOVUE-300 IOPAMIDOL (ISOVUE-300) INJECTION 61% COMPARISON:  02/25/2022 CT chest, abdomen and pelvis. FINDINGS: CT CHEST  FINDINGS Cardiovascular: Normal heart size. No significant  pericardial effusion/thickening. Left anterior descending and left circumflex coronary atherosclerosis. Atherosclerotic nonaneurysmal thoracic aorta. Normal caliber pulmonary arteries. No central pulmonary emboli. Mediastinum/Nodes: Subcentimeter hypodense bilateral thyroid nodules are unchanged. Not clinically significant; no follow-up imaging recommended (ref: J Am Coll Radiol. 2015 Feb;12(2): 143-50). Unremarkable esophagus. No pathologically enlarged axillary, mediastinal or hilar lymph nodes. Lungs/Pleura: No pneumothorax. Status post right upper lobectomy and right middle lobe wedge resection. Trace dependent right pleural effusion is unchanged. No left pleural effusion. No acute consolidative airspace disease or lung masses. Tiny 0.2 cm left lower lobe pulmonary nodule (series 6/image 79), unchanged. No new significant pulmonary nodules. Musculoskeletal: No aggressive appearing focal osseous lesions. Moderate thoracic spondylosis. CT ABDOMEN PELVIS FINDINGS Hepatobiliary: Normal liver with no liver mass. Cholelithiasis. No biliary ductal dilatation. Pancreas: Normal, with no mass or duct dilation. Spleen: Normal size. No mass. Adrenals/Urinary Tract: No right adrenal nodules. Left adrenal 1.0 cm nodule with density 146 HU (series 2/image 57), stable size back to at least 01/16/2021 CT, most suggestive of an adenoma. Normal kidneys with no hydronephrosis and no renal mass. Normal bladder. Stomach/Bowel: Normal non-distended stomach. Normal caliber small bowel with no small bowel wall thickening. Normal appendix. Oral contrast transits to the rectum. Mild left colonic diverticulosis with no large bowel wall thickening or significant pericolonic fat stranding. Vascular/Lymphatic: Atherosclerotic abdominal aorta with dilated 2.9 cm infrarenal abdominal aorta. Patent portal, splenic, hepatic and renal veins. No pathologically enlarged lymph nodes in the  abdomen or pelvis. Reproductive: Status post hysterectomy, with no abnormal findings at the vaginal cuff. No adnexal mass. Other: No pneumoperitoneum, ascites or focal fluid collection. Musculoskeletal: No aggressive appearing focal osseous lesions. Moderate multilevel lumbar degenerative disc disease. Bone cages at L3-4 and L4-5 disc spaces. Hardware between L3-4 and L4-5 spinous processes. IMPRESSION: 1. No evidence of local tumor recurrence in the right lung status post right upper lobectomy and right middle lobe wedge resection. 2. No evidence of metastatic disease in the chest, abdomen or pelvis. 3. Infrarenal 2.9 cm dilated infrarenal abdominal aorta. Recommend follow-up every 5 years. This recommendation follows ACR consensus guidelines: White Paper of the ACR Incidental Findings Committee II on Vascular Findings. J Am Coll Radiol 2013; 10:789-794. 4. Chronic findings include: Stable small left adrenal nodule, most suggestive of an adenoma. Two-vessel coronary atherosclerosis. Cholelithiasis. Mild left colonic diverticulosis. Aortic Atherosclerosis (ICD10-I70.0). Electronically Signed   By: Ilona Sorrel M.D.   On: 05/01/2022 11:46   CT Chest W Contrast  Result Date: 05/01/2022 CLINICAL DATA:  Stage IV non-small cell right lung cancer with brain metastasis with ongoing maintenance chemotherapy. Restaging. * Tracking Code: BO * EXAM: CT CHEST, ABDOMEN, AND PELVIS WITH CONTRAST TECHNIQUE: Multidetector CT imaging of the chest, abdomen and pelvis was performed following the standard protocol during bolus administration of intravenous contrast. RADIATION DOSE REDUCTION: This exam was performed according to the departmental dose-optimization program which includes automated exposure control, adjustment of the mA and/or kV according to patient size and/or use of iterative reconstruction technique. CONTRAST:  124mL ISOVUE-300 IOPAMIDOL (ISOVUE-300) INJECTION 61% COMPARISON:  02/25/2022 CT chest, abdomen and  pelvis. FINDINGS: CT CHEST FINDINGS Cardiovascular: Normal heart size. No significant pericardial effusion/thickening. Left anterior descending and left circumflex coronary atherosclerosis. Atherosclerotic nonaneurysmal thoracic aorta. Normal caliber pulmonary arteries. No central pulmonary emboli. Mediastinum/Nodes: Subcentimeter hypodense bilateral thyroid nodules are unchanged. Not clinically significant; no follow-up imaging recommended (ref: J Am Coll Radiol. 2015 Feb;12(2): 143-50). Unremarkable esophagus. No pathologically enlarged axillary, mediastinal or hilar lymph nodes. Lungs/Pleura: No pneumothorax. Status post  right upper lobectomy and right middle lobe wedge resection. Trace dependent right pleural effusion is unchanged. No left pleural effusion. No acute consolidative airspace disease or lung masses. Tiny 0.2 cm left lower lobe pulmonary nodule (series 6/image 79), unchanged. No new significant pulmonary nodules. Musculoskeletal: No aggressive appearing focal osseous lesions. Moderate thoracic spondylosis. CT ABDOMEN PELVIS FINDINGS Hepatobiliary: Normal liver with no liver mass. Cholelithiasis. No biliary ductal dilatation. Pancreas: Normal, with no mass or duct dilation. Spleen: Normal size. No mass. Adrenals/Urinary Tract: No right adrenal nodules. Left adrenal 1.0 cm nodule with density 146 HU (series 2/image 57), stable size back to at least 01/16/2021 CT, most suggestive of an adenoma. Normal kidneys with no hydronephrosis and no renal mass. Normal bladder. Stomach/Bowel: Normal non-distended stomach. Normal caliber small bowel with no small bowel wall thickening. Normal appendix. Oral contrast transits to the rectum. Mild left colonic diverticulosis with no large bowel wall thickening or significant pericolonic fat stranding. Vascular/Lymphatic: Atherosclerotic abdominal aorta with dilated 2.9 cm infrarenal abdominal aorta. Patent portal, splenic, hepatic and renal veins. No pathologically  enlarged lymph nodes in the abdomen or pelvis. Reproductive: Status post hysterectomy, with no abnormal findings at the vaginal cuff. No adnexal mass. Other: No pneumoperitoneum, ascites or focal fluid collection. Musculoskeletal: No aggressive appearing focal osseous lesions. Moderate multilevel lumbar degenerative disc disease. Bone cages at L3-4 and L4-5 disc spaces. Hardware between L3-4 and L4-5 spinous processes. IMPRESSION: 1. No evidence of local tumor recurrence in the right lung status post right upper lobectomy and right middle lobe wedge resection. 2. No evidence of metastatic disease in the chest, abdomen or pelvis. 3. Infrarenal 2.9 cm dilated infrarenal abdominal aorta. Recommend follow-up every 5 years. This recommendation follows ACR consensus guidelines: White Paper of the ACR Incidental Findings Committee II on Vascular Findings. J Am Coll Radiol 2013; 10:789-794. 4. Chronic findings include: Stable small left adrenal nodule, most suggestive of an adenoma. Two-vessel coronary atherosclerosis. Cholelithiasis. Mild left colonic diverticulosis. Aortic Atherosclerosis (ICD10-I70.0). Electronically Signed   By: Ilona Sorrel M.D.   On: 05/01/2022 11:46   MR BRAIN W WO CONTRAST  Addendum Date: 04/28/2022   ADDENDUM REPORT: 04/28/2022 07:54 ADDENDUM: On further review at brain tumor conference on 04/28/2022, there was a previously treated lesion at the location of the new enhancement in the right temporal lobe on the current study, though the enhancement is still new since the most recent prior. Delayed treatment effect vs recurrent disease at this location are both possible. Continued follow up recommended. Electronically Signed   By: Valetta Mole M.D.   On: 04/28/2022 07:54   Result Date: 04/28/2022 CLINICAL DATA:  History of lung cancer with metastases to brain in 2022. Tumor removal in April 2023. EXAM: MRI HEAD WITHOUT AND WITH CONTRAST TECHNIQUE: Multiplanar, multiecho pulse sequences of the  brain and surrounding structures were obtained without and with intravenous contrast. CONTRAST:  6 cc Vueway COMPARISON:  Brain MRI most recently 11/22/2021 from an outside institution. FINDINGS: Brain: Postsurgical changes reflecting left parietal craniotomy are again seen. There is decreased though not entirely resolved enhancement in the posterior frontal lobe underlying the craniotomy site with corresponding decreased FLAIR signal abnormality. A small amount of postsurgical blood products in this location are similar to the prior studies. The previously treated peripherally enhancing lesion in the left occipital lobe is decreased in size but has persistent peripheral enhancement, currently measuring 1.5 cm x 1.4 cm in the axial plane on image 13-74 (previously measured 2.0 cm x 1.8 cm  measured at a similar level in the axial plane). Surrounding edema has also decreased. There is a small amount chronic blood products at the periphery of this lesion. There is a 3 mm focus of ill-defined enhancement in the right temporal lobe with minimal corresponding FLAIR signal abnormality which is new since the prior study (13-77, 16-7). There is no acute intracranial hemorrhage, extra-axial fluid collection, or acute infarct Background parenchymal volume is stable. The ventricles are stable in size. Patchy FLAIR signal abnormality in the supratentorial white matter likely reflecting sequela of chronic small-vessel ischemic change is stable. There is no mass effect or midline shift. Vascular: Normal flow voids. Skull and upper cervical spine: Postsurgical changes reflecting left parietal craniotomy and treatment of the left occipital lobe metastasis as above. No suspicious marrow signal abnormality. Sinuses/Orbits: There is mild mucosal thickening in the paranasal sinuses. The globes and orbits are unremarkable. Other: There is a trace right mastoid effusion, unchanged. IMPRESSION: 1. Decreased size of the previously treated  left occipital lobe and posterior frontal lobe metastases with decreased surrounding edema. 2. New 3 mm focus of ill-defined enhancement in the right temporal lobe with minimal corresponding FLAIR signal abnormality suspicious for a new lesion. Electronically Signed: By: Valetta Mole M.D. On: 04/27/2022 17:33    Labs:  CBC: Recent Labs    03/06/22 1139 03/20/22 0926 04/10/22 0936 05/01/22 0939  WBC 3.6* 5.2 4.2 4.0  HGB 11.0* 11.1* 10.9* 11.1*  HCT 34.3* 34.7* 34.0* 35.4*  PLT 174.0 318 259 282    COAGS: No results for input(s): "INR", "APTT" in the last 8760 hours.  BMP: Recent Labs    02/27/22 0845 03/06/22 1139 03/20/22 0926 03/21/22 1000 04/10/22 0936 05/01/22 0939  NA 142   < > 143 142 141 142  K 4.1   < > 4.1 3.8 4.0 4.6  CL 108   < > 107 108 108 107  CO2 30   < > 29 28 29 31   GLUCOSE 126*   < > 121* 108* 138* 95  BUN 9   < > 8 10 12 14   CALCIUM 9.9   < > 9.5 9.6 9.3 9.5  CREATININE 0.73   < > 0.86 0.76 0.75 0.75  GFRNONAA >60  --  >60  --  >60 >60   < > = values in this interval not displayed.    LIVER FUNCTION TESTS: Recent Labs    03/20/22 0926 03/21/22 1000 04/10/22 0936 05/01/22 0939  BILITOT 0.3 0.2 0.3 0.3  AST 33 33 27 27  ALT 21 21 19 16   ALKPHOS 72 77 80 71  PROT 6.5 6.6 6.6 6.8  ALBUMIN 3.7 3.6 3.7 3.8    TUMOR MARKERS: No results for input(s): "AFPTM", "CEA", "CA199", "CHROMGRNA" in the last 8760 hours.  Assessment and Plan: 71 year old female presents to IR for tunneled catheter with port placement to continue treatment for metastatic lung cancer.  Risks and benefits of image guided tunneled catheter with port placement with moderate sedation was discussed with the patient including, but not limited to bleeding, infection, pneumothorax, or fibrin sheath development and need for additional procedures.  All of the patient's questions were answered, patient is agreeable to proceed. Consent signed and in chart.  Thank you for this  interesting consult.  I greatly enjoyed meeting Hannah [name] and look forward to participating in their care.  A copy of this report was sent to the requesting provider on this date.  Electronically Signed: Tyson Alias,  NP 05/13/2022, 4:34 PM   I spent a total of {New INPT:304952001} {New Out-Pt:304952002}  {Established Out-Pt:304952003} in face to face in clinical consultation, greater than 50% of which was counseling/coordinating care for metastatic lung adenocarcinoma.

## 2022-05-14 ENCOUNTER — Other Ambulatory Visit (HOSPITAL_COMMUNITY): Payer: Medicare HMO

## 2022-05-14 ENCOUNTER — Ambulatory Visit (HOSPITAL_COMMUNITY): Payer: Medicare HMO

## 2022-05-14 ENCOUNTER — Other Ambulatory Visit (HOSPITAL_COMMUNITY): Payer: Self-pay | Admitting: Physician Assistant

## 2022-05-15 ENCOUNTER — Other Ambulatory Visit: Payer: Self-pay

## 2022-05-15 ENCOUNTER — Ambulatory Visit (HOSPITAL_COMMUNITY)
Admission: RE | Admit: 2022-05-15 | Discharge: 2022-05-15 | Disposition: A | Discharge status: Home / Self Care | Payer: Medicare HMO | Source: Ambulatory Visit | Attending: Physician Assistant | Admitting: Physician Assistant

## 2022-05-15 ENCOUNTER — Encounter (HOSPITAL_COMMUNITY): Payer: Self-pay

## 2022-05-15 ENCOUNTER — Ambulatory Visit (HOSPITAL_COMMUNITY)
Admission: RE | Admit: 2022-05-15 | Discharge: 2022-05-15 | Disposition: A | Discharge status: Home / Self Care | Payer: Medicare HMO | Source: Ambulatory Visit

## 2022-05-15 DIAGNOSIS — C3490 Malignant neoplasm of unspecified part of unspecified bronchus or lung: Secondary | ICD-10-CM | POA: Diagnosis not present

## 2022-05-15 DIAGNOSIS — C7931 Secondary malignant neoplasm of brain: Secondary | ICD-10-CM | POA: Insufficient documentation

## 2022-05-15 DIAGNOSIS — C3491 Malignant neoplasm of unspecified part of right bronchus or lung: Secondary | ICD-10-CM | POA: Insufficient documentation

## 2022-05-15 DIAGNOSIS — Z87891 Personal history of nicotine dependence: Secondary | ICD-10-CM | POA: Insufficient documentation

## 2022-05-15 DIAGNOSIS — Z452 Encounter for adjustment and management of vascular access device: Secondary | ICD-10-CM | POA: Diagnosis not present

## 2022-05-15 HISTORY — PX: IR IMAGING GUIDED PORT INSERTION: IMG5740

## 2022-05-15 LAB — GLUCOSE, CAPILLARY: Glucose-Capillary: 90 mg/dL (ref 70–99)

## 2022-05-15 MED ORDER — SODIUM CHLORIDE 0.9 % IV SOLN: INTRAVENOUS | Status: DC

## 2022-05-15 MED ORDER — MIDAZOLAM HCL 2 MG/2ML IJ SOLN
INTRAMUSCULAR | Status: AC
  Filled 2022-05-15: qty 4

## 2022-05-15 MED ORDER — MIDAZOLAM HCL 2 MG/2ML IJ SOLN
INTRAMUSCULAR | Status: AC | PRN
  Administered 2022-05-15: 1 mg via INTRAVENOUS
  Administered 2022-05-15: .5 mg via INTRAVENOUS

## 2022-05-15 MED ORDER — LIDOCAINE-EPINEPHRINE 1 %-1:100000 IJ SOLN
INTRAMUSCULAR | Status: AC | PRN
  Administered 2022-05-15: 10 mL

## 2022-05-15 MED ORDER — LIDOCAINE-EPINEPHRINE 1 %-1:100000 IJ SOLN
INTRAMUSCULAR | Status: AC
  Filled 2022-05-15: qty 1

## 2022-05-15 MED ORDER — FENTANYL CITRATE (PF) 100 MCG/2ML IJ SOLN
INTRAMUSCULAR | Status: AC | PRN
  Administered 2022-05-15: 25 ug via INTRAVENOUS
  Administered 2022-05-15: 50 ug via INTRAVENOUS

## 2022-05-15 MED ORDER — HEPARIN SOD (PORK) LOCK FLUSH 100 UNIT/ML IV SOLN
INTRAVENOUS | Status: AC
  Administered 2022-05-15: 500 [IU]
  Filled 2022-05-15: qty 5

## 2022-05-15 MED ORDER — FENTANYL CITRATE (PF) 100 MCG/2ML IJ SOLN
INTRAMUSCULAR | Status: AC
  Filled 2022-05-15: qty 4

## 2022-05-15 NOTE — Procedures (Signed)
Pre Procedure Dx: Poor venous access Post Procedural Dx: Same  Successful placement of right IJ approach port-a-cath with tip at the superior caval atrial junction. The catheter is ready for immediate use.  Estimated Blood Loss: Trace  Complications: None immediate.  Jay Drema Eddington, MD Pager #: [phone]   

## 2022-05-15 NOTE — Sedation Documentation (Signed)
Bandage applied.

## 2022-05-15 NOTE — Sedation Documentation (Signed)
Patient transported to IR suite via stretcher by [employer] technologists. Patient positioned supine on scanner with monitors and oxygen attached.

## 2022-05-15 NOTE — Discharge Instructions (Addendum)
Discharge Instructions:   Please call Interventional Radiology clinic [phone] with any questions or concerns.  You may remove your dressing and shower tomorrow.   Do not submerge into water.  Do not use EMLA / Lidocaine cream for 2 weeks post Port Insertion.   Implanted Port Insertion, Care After The following information offers guidance on how to care for yourself after your procedure. Your health care provider may also give you more specific instructions. If you have problems or questions, contact your health care provider. What can I expect after the procedure? After the procedure, it is common to have: Discomfort at the port insertion site. Bruising on the skin over the port. This should improve over 3-4 days. Follow these instructions at home: [facility] care After your port is placed, you will get a manufacturer's information card. The card has information about your port. Keep this card with you at all times. Take care of the port as told by your health care provider. Ask your health care provider if you or a family member can get training for taking care of the port at home. A home health care nurse will be be available to help care for the port. Make sure to remember what type of port you have. Incision care     Follow instructions from your health care provider about how to take care of your port insertion site. Make sure you: Wash your hands with soap and water for at least 20 seconds before and after you change your bandage (dressing). If soap and water are not available, use hand sanitizer. Change your dressing as told by your health care provider. Leave stitches (sutures), skin glue, or adhesive strips in place. These skin closures may need to stay in place for 2 weeks or longer. If adhesive strip edges start to loosen and curl up, you may trim the loose edges. Do not remove adhesive strips completely unless your health care provider tells you to do that. Check your port  insertion site every day for signs of infection. Check for: Redness, swelling, or pain. Fluid or blood. Warmth. Pus or a bad smell. Activity Return to your normal activities as told by your health care provider. Ask your health care provider what activities are safe for you. You may have to avoid lifting. Ask your health care provider how much you can safely lift. General instructions Take over-the-counter and prescription medicines only as told by your health care provider. Do not take baths, swim, or use a hot tub until your health care provider approves. Ask your health care provider if you may take showers. You may only be allowed to take sponge baths. If you were given a sedative during the procedure, it can affect you for several hours. Do not drive or operate machinery until your health care provider says that it is safe. Wear a medical alert bracelet in case of an emergency. This will tell any health care providers that you have a port. Keep all follow-up visits. This is important. Contact a health care provider if: You cannot flush your port with saline as directed, or you cannot draw blood from the port. You have a fever or chills. You have redness, swelling, or pain around your port insertion site. You have fluid or blood coming from your port insertion site. Your port insertion site feels warm to the touch. You have pus or a bad smell coming from the port insertion site. Get help right away if: You have chest pain or shortness  of breath. You have bleeding from your port that you cannot control. These symptoms may be an emergency. Get help right away. Call 911. Do not wait to see if the symptoms will go away. Do not drive yourself to the hospital. Summary Take care of the port as told by your health care provider. Keep the manufacturer's information card with you at all times. Change your dressing as told by your health care provider. Contact a health care provider if you  have a fever or chills or if you have redness, swelling, or pain around your port insertion site. Keep all follow-up visits. This information is not intended to replace advice given to you by your health care provider. Make sure you discuss any questions you have with your health care provider. Document Revised: 12/18/2020 Document Reviewed: 12/18/2020 [employer] Patient Education  [place].   Moderate Conscious Sedation, Adult, Care After This sheet gives you information about how to care for yourself after your procedure. Your health care provider may also give you more specific instructions. If you have problems or questions, contact your health care provider. What can I expect after the procedure? After the procedure, it is common to have: Sleepiness for several hours. Impaired judgment for several hours. Difficulty with balance. Vomiting if you eat too soon. Follow these instructions at home: For the time period you were told by your health care provider:     Rest. Do not participate in activities where you could fall or become injured. Do not drive or use machinery. Do not drink alcohol. Do not take sleeping pills or medicines that cause drowsiness. Do not make important decisions or sign legal documents. Do not take care of children on your own. Eating and drinking  Follow the diet recommended by your health care provider. Drink enough fluid to keep your urine pale yellow. If you vomit: Drink water, juice, or soup when you can drink without vomiting. Make sure you have little or no nausea before eating solid foods. General instructions Take over-the-counter and prescription medicines only as told by your health care provider. Have a responsible adult stay with you for the time you are told. It is important to have someone help care for you until you are awake and alert. Do not smoke. Keep all follow-up visits as told by your health care provider. This is  important. Contact a health care provider if: You are still sleepy or having trouble with balance after 24 hours. You feel light-headed. You keep feeling nauseous or you keep vomiting. You develop a rash. You have a fever. You have redness or swelling around the IV site. Get help right away if: You have trouble breathing. You have new-onset confusion at home. Summary After the procedure, it is common to feel sleepy, have impaired judgment, or feel nauseous if you eat too soon. Rest after you get home. Know the things you should not do after the procedure. Follow the diet recommended by your health care provider and drink enough fluid to keep your urine pale yellow. Get help right away if you have trouble breathing or new-onset confusion at home. This information is not intended to replace advice given to you by your health care provider. Make sure you discuss any questions you have with your health care provider. Document Revised: 10/14/2019 Document Reviewed: 05/12/2019 Elsevier Patient Education  [place].

## 2022-05-16 NOTE — Progress Notes (Unsigned)
[place] OFFICE PROGRESS NOTE  [name], MD [place] [address]  DIAGNOSIS: Stage IV non-small cell lung cancer (T3, N0, M1C) adenocarcinoma.  The patient presented with a right upper lobe lung mass and solitary brain metastasis.  The patient was diagnosed in February 2022.   Biomarker Findings Microsatellite status - MS-Stable Tumor Mutational Burden - 8 Muts/Mb Genomic Findings For a complete list of the genes assayed, please refer to the Appendix. KRAS G12V KEAP1 S224F TP53 P151T 7 Disease relevant genes with no reportable alterations: ALK, BRAF, EGFR, ERBB2, MET, RET, ROS1   PDL1 Expression: 90%  PRIOR THERAPY: 1) SRS to the metastatic brain lesion on 08/23/2020 under the care of Dr. Tammi Klippel and craniotomy under the care of Dr. Zada Finders on 08/24/2020. 2) S/p robotic assisted right upper lobectomy with en bloc wedge resection of the right middle lobe and lymph node dissection under the care of Dr. Roxan Hockey on September 24, 2020 3) SRS to the two new subcentimeter metastases under the care of Dr. Tammi Klippel on 11/29/20.  4) 10/14/21: L frontoparietal progression, undergoes LITT at [facility]) 5) SRS to brain metastasis, last dose on 11/18/21  CURRENT THERAPY: Systemic chemotherapy with carboplatin for AUC of 5, Alimta 500 Mg/M2 and Keytruda 200 mg IV every 3 weeks.  First dose Nov 08, 2020. Status post 25 cycles.  Starting from cycle #5, the patient started maintenance Alimta and Keytruda.   INTERVAL HISTORY: Hannah [name] 71 y.o. [name] returns to the clinic today for a follow-up visit accompanied by her daughter. The patient is feeling well today without any concerning complaints.  In the interval since last being seen, patient had Port-A-Cath placed which she tolerated well. Otherwise, she is tolerating her treatment with Keytruda and Alimta well without any concerning adverse side effects except fatigue. Denies any fever or chills.  He  denies any recent night sweats.  Denies any shortness of breath. Denies cough. Denies any chest discomfort. Did did have one episode of coughing up a quarter-sized clot of blood in the interval. She takes 81 mg aspirin but no other blood thinners. She does have sinus congestion but denies any known nasal bleeding or post-nasal bleeding. She had just had a restaging CT scan prior to her episode of hemoptysis which did not show signs of local tumor recurrence or acute abnormality. Denies any nausea, vomiting, or diarrhea. She has constipation at baseline. She sees neuro-oncology here with Dr. Mickeal Skinner. She also is seen at [facility].  She had a brain MRI performed last month and has a follow up brain MRI scheduled for 07/17/21. She is here today for evaluation and repeat blood work before starting cycle #25.     MEDICAL HISTORY: Past Medical History:  Diagnosis Date   Allergy    Anemia    Anxiety    Arthritis    Carpal tunnel syndrome    Constipation    senna C stool softeners help    Depression    Diverticulosis    Dyslipidemia    External hemorrhoids    GERD (gastroesophageal reflux disease)    Heart murmur    mild-moderate AR   Hiatal hernia    Hyperlipidemia    on meds    Hypertension    Internal hemorrhoids    lung ca with brain mets 07/2020   Osteoarthritis    Pre-diabetes    PVD (peripheral vascular disease) (HCC)    moderate carotid disease   RBBB  Smoker    Vocal cord polyps     ALLERGIES:  is allergic to amlodipine, chantix [varenicline tartrate], clarithromycin, lisinopril, simvastatin, wellbutrin [bupropion hcl], lipitor [atorvastatin], and sertraline.  MEDICATIONS:  Current Outpatient Medications  Medication Sig Dispense Refill   acetaminophen (TYLENOL) 325 MG tablet Take 325 mg by mouth every 6 (six) hours as needed for moderate pain.     ALPRAZolam (XANAX XR) 0.5 MG 24 hr tablet Take 1 tablet (0.5 mg total) by mouth daily. 30 tablet 5   ALPRAZolam (XANAX) 0.5 MG tablet  TAKE 1 TABLET BY MOUTH THREE TIMES DAILY AS NEEDED FOR ANXIETY 90 tablet 0   aspirin 81 MG tablet Take 1 tablet (81 mg total) by mouth daily. Restart on 08/31/20 30 tablet    bisacodyl (DULCOLAX) 5 MG EC tablet Take 5 mg by mouth daily as needed for moderate constipation.     carboxymethylcellulose (REFRESH PLUS) 0.5 % SOLN Place 1-2 drops into both eyes 3 (three) times daily as needed (dry eyes).     Cholecalciferol (D3 PO) Take 1 capsule by mouth daily.     citalopram (CELEXA) 10 MG tablet Take 1 tablet by mouth once daily 90 tablet 0   fluticasone (FLONASE) 50 MCG/ACT nasal spray PLACE 2 SPRAYS INTO BOTH NOSTRILS DAILY. (Patient taking differently: Place 2 sprays into both nostrils daily as needed for allergies.) 48 g 1   folic acid (FOLVITE) 1 MG tablet Take 1 tablet by mouth once daily 30 tablet 0   Lacosamide 100 MG TABS Take 1 tablet (100 mg total) by mouth 2 (two) times daily. 60 tablet 3   levETIRAcetam (KEPPRA) 100 MG/ML solution TAKE 15 ML BY MOUTH  TWICE DAILY 473 mL 0   lidocaine-prilocaine (EMLA) cream Apply 1 Application topically as needed. 30 g 2   loratadine (CLARITIN) 10 MG tablet Take 10 mg by mouth daily as needed for allergies.     magnesium hydroxide (MILK OF MAGNESIA) 800 MG/5ML suspension Take 30 mLs by mouth daily as needed for constipation.     Menthol, Topical Analgesic, (BIOFREEZE EX) Apply 1 application topically as needed (pain).     mirtazapine (REMERON) 15 MG tablet Take 0.5 tablets (7.5 mg total) by mouth at bedtime. 90 tablet 2   olopatadine (PATANOL) 0.1 % ophthalmic solution Place 1 drop into both eyes 2 (two) times daily as needed for allergies.     omeprazole (PRILOSEC) 40 MG capsule TAKE 1 CAPSULE TWICE DAILY BEFORE MEALS (Patient taking differently: Take 40 mg by mouth daily.) 180 capsule 0   polyethylene glycol (MIRALAX / GLYCOLAX) 17 g packet Take 17 g by mouth 2 (two) times daily. 72 each 0   pravastatin (PRAVACHOL) 40 MG tablet Take 1 tablet (40 mg total)  by mouth every evening. 90 tablet 3   Probiotic Product (PROBIOTIC DAILY PO) Take 1 capsule by mouth daily.     prochlorperazine (COMPAZINE) 10 MG tablet TAKE 1 TABLET BY MOUTH EVERY 6 HOURS AS NEEDED FOR NAUSEA OR VOMITING 30 tablet 0   simethicone (MYLICON) 80 MG chewable tablet Chew 1 tablet (80 mg total) by mouth 4 (four) times daily as needed for flatulence (Bloating). 30 tablet 0   No current facility-administered medications for this visit.    SURGICAL HISTORY:  Past Surgical History:  Procedure Laterality Date   ABDOMINAL HYSTERECTOMY  2683   APPLICATION OF CRANIAL NAVIGATION N/A 08/24/2020   Procedure: APPLICATION OF CRANIAL NAVIGATION;  Surgeon: Judith Part, MD;  Location: [facility];  Service:  Neurosurgery;  Laterality: N/A;   COLONOSCOPY     COLONOSCOPY W/ POLYPECTOMY  2009   CRANIOTOMY Left 08/24/2020   Procedure: Left Craniotomy for Tumor Resection with Brainlab;  Surgeon: Judith Part, MD;  Location: [facility] [place];  Service: Neurosurgery;  Laterality: Left;   HEMORRHOID SURGERY     INTERCOSTAL NERVE BLOCK Right 09/24/2020   Procedure: INTERCOSTAL NERVE BLOCK;  Surgeon: Melrose Nakayama, MD;  Location: [facility];  Service: Thoracic;  Laterality: Right;   IR IMAGING GUIDED PORT INSERTION  05/15/2022   JOINT REPLACEMENT     LYMPH NODE DISSECTION Right 09/24/2020   Procedure: LYMPH NODE DISSECTION;  Surgeon: Melrose Nakayama, MD;  Location: [facility];  Service: Thoracic;  Laterality: Right;   POLYPECTOMY     right total knee arthroplasty     Dr. Emeterio Reeve 06-04-18   [place]  08/16/2009   TOTAL KNEE ARTHROPLASTY Left 02/13/2014   Procedure: TOTAL KNEE ARTHROPLASTY;  Surgeon: Alta Corning, MD;  Location: [facility];  Service: Orthopedics;  Laterality: Left;   TOTAL KNEE ARTHROPLASTY Right 06/04/2018   Procedure: RIGHT TOTAL KNEE ARTHROPLASTY;  Surgeon: Dorna Leitz, MD;  Location: [facility];  Service: Orthopedics;  Laterality: Right;  Adductor Block   TUBAL LIGATION      REVIEW  OF SYSTEMS:   Review of Systems  Constitutional: Positive for fatigue. Negative for appetite change, chills, fever and unexpected weight change.  HENT: Negative for mouth sores, nosebleeds, sore throat and trouble swallowing.   Eyes: Negative for eye problems and icterus.  Respiratory: Positive for one episode of hemoptysis. Negative for cough, shortness of breath and wheezing.   Cardiovascular: Negative for chest pain and leg swelling.  Gastrointestinal: Negative for abdominal pain, constipation, diarrhea, nausea and vomiting.  Genitourinary: Negative for bladder incontinence, difficulty urinating, dysuria, frequency and hematuria.   Musculoskeletal: Negative for back pain, gait problem, neck pain and neck stiffness.  Skin: Negative for itching and rash.  Neurological: Negative for dizziness, extremity weakness, gait problem, headaches, light-headedness and seizures.  Hematological: Negative for adenopathy. Does not bruise/bleed easily.  Psychiatric/Behavioral: Negative for confusion, depression and sleep disturbance. The patient is not nervous/anxious.     PHYSICAL EXAMINATION:  Blood pressure 135/77, pulse 74, temperature 98.2 F (36.8 C), temperature source Oral, resp. rate 18, height 5' (1.524 m), weight 133 lb 4.8 oz (60.5 kg), SpO2 100 %.  ECOG PERFORMANCE STATUS: 1  Physical Exam  Constitutional: Oriented to person, place, and time and thin appearing [name] and in no distress. HENT: Head: Normocephalic and atraumatic. Mouth/Throat: Oropharynx is clear and moist. No oropharyngeal exudate. No thrush.  Eyes: Conjunctivae are normal. Right eye exhibits no discharge. Left eye exhibits no discharge. No scleral icterus. Neck: Normal range of motion. Neck supple. Cardiovascular: Normal rate, regular rhythm, systolic murmur noted and intact distal pulses.   Abdominal: Soft. Bowel sounds are normal. Exhibits no distension and no mass. There is no tenderness.  Musculoskeletal: Swan neck  deformity due to RA. No weakness in hands. No decreased sensation. No swelling or erythema. Normal range of motion. Exhibits no edema.  Lymphadenopathy:    No cervical adenopathy.  Neurological: Alert and oriented to person, place, and time. Exhibits normal muscle tone. Gait normal. Coordination normal.  Skin: Skin is warm and dry. No rash noted. Not diaphoretic. No erythema. No pallor.  Psychiatric: Mood, memory and judgment normal.  Vitals reviewed.  LABORATORY DATA: Lab Results  Component Value Date   WBC 4.1 05/21/2022   HGB 10.8 (  L) 05/21/2022   HCT 34.6 (L) 05/21/2022   MCV 91.3 05/21/2022   PLT 317 05/21/2022      Chemistry      Component Value Date/Time   NA 142 05/01/2022 0939   K 4.6 05/01/2022 0939   CL 107 05/01/2022 0939   CO2 31 05/01/2022 0939   BUN 14 05/01/2022 0939   CREATININE 0.75 05/01/2022 0939   CREATININE 0.79 03/14/2020 1016      Component Value Date/Time   CALCIUM 9.5 05/01/2022 0939   ALKPHOS 71 05/01/2022 0939   AST 27 05/01/2022 0939   ALT 16 05/01/2022 0939   BILITOT 0.3 05/01/2022 0939       RADIOGRAPHIC STUDIES:  IR IMAGING GUIDED PORT INSERTION  Result Date: 05/15/2022 INDICATION: Lung cancer. In need of durable intravenous access for chemotherapy administration. EXAM: IMPLANTED PORT A CATH PLACEMENT WITH ULTRASOUND AND FLUOROSCOPIC GUIDANCE COMPARISON:  Chest CT-04/29/2022 MEDICATIONS: None ANESTHESIA/SEDATION: Moderate (conscious) sedation was employed during this procedure as administered by the Interventional Radiology RN. A total of Versed 1.5 mg and Fentanyl 75 mcg was administered intravenously. Moderate Sedation Time: 25 minutes. The patient's level of consciousness and vital signs were monitored continuously by radiology nursing throughout the procedure under my direct supervision. CONTRAST:  None FLUOROSCOPY TIME:  30 seconds (2 mGy) COMPLICATIONS: None immediate. PROCEDURE: The procedure, risks, benefits, and alternatives were  explained to the patient. Questions regarding the procedure were encouraged and answered. The patient understands and consents to the procedure. The right neck and chest were prepped with chlorhexidine in a sterile fashion, and a sterile drape was applied covering the operative field. Maximum barrier sterile technique with sterile gowns and gloves were used for the procedure. A timeout was performed prior to the initiation of the procedure. Local anesthesia was provided with 1% lidocaine with epinephrine. After creating a small venotomy incision, a micropuncture kit was utilized to access the internal jugular vein. Real-time ultrasound guidance was utilized for vascular access including the acquisition of a permanent ultrasound image documenting patency of the accessed vessel. The microwire was utilized to measure appropriate catheter length. A subcutaneous port pocket was then created along the upper chest wall utilizing a combination of sharp and blunt dissection. The pocket was irrigated with sterile saline. A single lumen Slim sized power injectable port was chosen for placement. The 8 Fr catheter was tunneled from the port pocket site to the venotomy incision. The port was placed in the pocket. The external catheter was trimmed to appropriate length. At the venotomy, an 8 Fr peel-away sheath was placed over a guidewire under fluoroscopic guidance. The catheter was then placed through the sheath and the sheath was removed. Final catheter positioning was confirmed and documented with a fluoroscopic spot radiograph. The port was accessed with a Huber needle, aspirated and flushed with heparinized saline. The venotomy site was closed with an interrupted 4-0 Vicryl suture. The port pocket incision was closed with interrupted 2-0 Vicryl suture. Dermabond and Steri-strips were applied to both incisions. Dressings were applied. The patient tolerated the procedure well without immediate post procedural complication.  FINDINGS: After catheter placement, the tip lies within the superior cavoatrial junction. The catheter aspirates and flushes normally and is ready for immediate use. IMPRESSION: Successful placement of a right internal jugular approach power injectable Port-A-Cath. The catheter is ready for immediate use. Electronically Signed   By: Sandi Mariscal M.D.   On: 05/15/2022 15:46   CT Abdomen Pelvis W Contrast  Result Date: 05/01/2022 CLINICAL DATA:  Stage IV non-small cell right lung cancer with brain metastasis with ongoing maintenance chemotherapy. Restaging. * Tracking Code: BO * EXAM: CT CHEST, ABDOMEN, AND PELVIS WITH CONTRAST TECHNIQUE: Multidetector CT imaging of the chest, abdomen and pelvis was performed following the standard protocol during bolus administration of intravenous contrast. RADIATION DOSE REDUCTION: This exam was performed according to the departmental dose-optimization program which includes automated exposure control, adjustment of the mA and/or kV according to patient size and/or use of iterative reconstruction technique. CONTRAST:  115m ISOVUE-300 IOPAMIDOL (ISOVUE-300) INJECTION 61% COMPARISON:  02/25/2022 CT chest, abdomen and pelvis. FINDINGS: CT CHEST FINDINGS Cardiovascular: Normal heart size. No significant pericardial effusion/thickening. Left anterior descending and left circumflex coronary atherosclerosis. Atherosclerotic nonaneurysmal thoracic aorta. Normal caliber pulmonary arteries. No central pulmonary emboli. Mediastinum/Nodes: Subcentimeter hypodense bilateral thyroid nodules are unchanged. Not clinically significant; no follow-up imaging recommended (ref: J Am Coll Radiol. 2015 Feb;12(2): 143-50). Unremarkable esophagus. No pathologically enlarged axillary, mediastinal or hilar lymph nodes. Lungs/Pleura: No pneumothorax. Status post right upper lobectomy and right middle lobe wedge resection. Trace dependent right pleural effusion is unchanged. No left pleural effusion. No  acute consolidative airspace disease or lung masses. Tiny 0.2 cm left lower lobe pulmonary nodule (series 6/image 79), unchanged. No new significant pulmonary nodules. Musculoskeletal: No aggressive appearing focal osseous lesions. Moderate thoracic spondylosis. CT ABDOMEN PELVIS FINDINGS Hepatobiliary: Normal liver with no liver mass. Cholelithiasis. No biliary ductal dilatation. Pancreas: Normal, with no mass or duct dilation. Spleen: Normal size. No mass. Adrenals/Urinary Tract: No right adrenal nodules. Left adrenal 1.0 cm nodule with density 146 HU (series 2/image 57), stable size back to at least 01/16/2021 CT, most suggestive of an adenoma. Normal kidneys with no hydronephrosis and no renal mass. Normal bladder. Stomach/Bowel: Normal non-distended stomach. Normal caliber small bowel with no small bowel wall thickening. Normal appendix. Oral contrast transits to the rectum. Mild left colonic diverticulosis with no large bowel wall thickening or significant pericolonic fat stranding. Vascular/Lymphatic: Atherosclerotic abdominal aorta with dilated 2.9 cm infrarenal abdominal aorta. Patent portal, splenic, hepatic and renal veins. No pathologically enlarged lymph nodes in the abdomen or pelvis. Reproductive: Status post hysterectomy, with no abnormal findings at the vaginal cuff. No adnexal mass. Other: No pneumoperitoneum, ascites or focal fluid collection. Musculoskeletal: No aggressive appearing focal osseous lesions. Moderate multilevel lumbar degenerative disc disease. Bone cages at L3-4 and L4-5 disc spaces. Hardware between L3-4 and L4-5 spinous processes. IMPRESSION: 1. No evidence of local tumor recurrence in the right lung status post right upper lobectomy and right middle lobe wedge resection. 2. No evidence of metastatic disease in the chest, abdomen or pelvis. 3. Infrarenal 2.9 cm dilated infrarenal abdominal aorta. Recommend follow-up every 5 years. This recommendation follows ACR consensus  guidelines: White Paper of the ACR Incidental Findings Committee II on Vascular Findings. J Am Coll Radiol 2013; 10:789-794. 4. Chronic findings include: Stable small left adrenal nodule, most suggestive of an adenoma. Two-vessel coronary atherosclerosis. Cholelithiasis. Mild left colonic diverticulosis. Aortic Atherosclerosis (ICD10-I70.0). Electronically Signed   By: JIlona SorrelM.D.   On: 05/01/2022 11:46   CT Chest W Contrast  Result Date: 05/01/2022 CLINICAL DATA:  Stage IV non-small cell right lung cancer with brain metastasis with ongoing maintenance chemotherapy. Restaging. * Tracking Code: BO * EXAM: CT CHEST, ABDOMEN, AND PELVIS WITH CONTRAST TECHNIQUE: Multidetector CT imaging of the chest, abdomen and pelvis was performed following the standard protocol during bolus administration of intravenous contrast. RADIATION DOSE REDUCTION: This exam was performed according to the departmental  dose-optimization program which includes automated exposure control, adjustment of the mA and/or kV according to patient size and/or use of iterative reconstruction technique. CONTRAST:  130m ISOVUE-300 IOPAMIDOL (ISOVUE-300) INJECTION 61% COMPARISON:  02/25/2022 CT chest, abdomen and pelvis. FINDINGS: CT CHEST FINDINGS Cardiovascular: Normal heart size. No significant pericardial effusion/thickening. Left anterior descending and left circumflex coronary atherosclerosis. Atherosclerotic nonaneurysmal thoracic aorta. Normal caliber pulmonary arteries. No central pulmonary emboli. Mediastinum/Nodes: Subcentimeter hypodense bilateral thyroid nodules are unchanged. Not clinically significant; no follow-up imaging recommended (ref: J Am Coll Radiol. 2015 Feb;12(2): 143-50). Unremarkable esophagus. No pathologically enlarged axillary, mediastinal or hilar lymph nodes. Lungs/Pleura: No pneumothorax. Status post right upper lobectomy and right middle lobe wedge resection. Trace dependent right pleural effusion is unchanged. No  left pleural effusion. No acute consolidative airspace disease or lung masses. Tiny 0.2 cm left lower lobe pulmonary nodule (series 6/image 79), unchanged. No new significant pulmonary nodules. Musculoskeletal: No aggressive appearing focal osseous lesions. Moderate thoracic spondylosis. CT ABDOMEN PELVIS FINDINGS Hepatobiliary: Normal liver with no liver mass. Cholelithiasis. No biliary ductal dilatation. Pancreas: Normal, with no mass or duct dilation. Spleen: Normal size. No mass. Adrenals/Urinary Tract: No right adrenal nodules. Left adrenal 1.0 cm nodule with density 146 HU (series 2/image 57), stable size back to at least 01/16/2021 CT, most suggestive of an adenoma. Normal kidneys with no hydronephrosis and no renal mass. Normal bladder. Stomach/Bowel: Normal non-distended stomach. Normal caliber small bowel with no small bowel wall thickening. Normal appendix. Oral contrast transits to the rectum. Mild left colonic diverticulosis with no large bowel wall thickening or significant pericolonic fat stranding. Vascular/Lymphatic: Atherosclerotic abdominal aorta with dilated 2.9 cm infrarenal abdominal aorta. Patent portal, splenic, hepatic and renal veins. No pathologically enlarged lymph nodes in the abdomen or pelvis. Reproductive: Status post hysterectomy, with no abnormal findings at the vaginal cuff. No adnexal mass. Other: No pneumoperitoneum, ascites or focal fluid collection. Musculoskeletal: No aggressive appearing focal osseous lesions. Moderate multilevel lumbar degenerative disc disease. Bone cages at L3-4 and L4-5 disc spaces. Hardware between L3-4 and L4-5 spinous processes. IMPRESSION: 1. No evidence of local tumor recurrence in the right lung status post right upper lobectomy and right middle lobe wedge resection. 2. No evidence of metastatic disease in the chest, abdomen or pelvis. 3. Infrarenal 2.9 cm dilated infrarenal abdominal aorta. Recommend follow-up every 5 years. This recommendation  follows ACR consensus guidelines: White Paper of the ACR Incidental Findings Committee II on Vascular Findings. J Am Coll Radiol 2013; 10:789-794. 4. Chronic findings include: Stable small left adrenal nodule, most suggestive of an adenoma. Two-vessel coronary atherosclerosis. Cholelithiasis. Mild left colonic diverticulosis. Aortic Atherosclerosis (ICD10-I70.0). Electronically Signed   By: JIlona SorrelM.D.   On: 05/01/2022 11:46   MR BRAIN W WO CONTRAST  Addendum Date: 04/28/2022   ADDENDUM REPORT: 04/28/2022 07:54 ADDENDUM: On further review at brain tumor conference on 04/28/2022, there was a previously treated lesion at the location of the new enhancement in the right temporal lobe on the current study, though the enhancement is still new since the most recent prior. Delayed treatment effect vs recurrent disease at this location are both possible. Continued follow up recommended. Electronically Signed   By: PValetta MoleM.D.   On: 04/28/2022 07:54   Result Date: 04/28/2022 CLINICAL DATA:  History of lung cancer with metastases to brain in 2022. Tumor removal in April 2023. EXAM: MRI HEAD WITHOUT AND WITH CONTRAST TECHNIQUE: Multiplanar, multiecho pulse sequences of the brain and surrounding structures were obtained without and  with intravenous contrast. CONTRAST:  6 cc Vueway COMPARISON:  Brain MRI most recently 11/22/2021 from an outside institution. FINDINGS: Brain: Postsurgical changes reflecting left parietal craniotomy are again seen. There is decreased though not entirely resolved enhancement in the posterior frontal lobe underlying the craniotomy site with corresponding decreased FLAIR signal abnormality. A small amount of postsurgical blood products in this location are similar to the prior studies. The previously treated peripherally enhancing lesion in the left occipital lobe is decreased in size but has persistent peripheral enhancement, currently measuring 1.5 cm x 1.4 cm in the axial plane  on image 13-74 (previously measured 2.0 cm x 1.8 cm measured at a similar level in the axial plane). Surrounding edema has also decreased. There is a small amount chronic blood products at the periphery of this lesion. There is a 3 mm focus of ill-defined enhancement in the right temporal lobe with minimal corresponding FLAIR signal abnormality which is new since the prior study (13-77, 16-7). There is no acute intracranial hemorrhage, extra-axial fluid collection, or acute infarct Background parenchymal volume is stable. The ventricles are stable in size. Patchy FLAIR signal abnormality in the supratentorial white matter likely reflecting sequela of chronic small-vessel ischemic change is stable. There is no mass effect or midline shift. Vascular: Normal flow voids. Skull and upper cervical spine: Postsurgical changes reflecting left parietal craniotomy and treatment of the left occipital lobe metastasis as above. No suspicious marrow signal abnormality. Sinuses/Orbits: There is mild mucosal thickening in the paranasal sinuses. The globes and orbits are unremarkable. Other: There is a trace right mastoid effusion, unchanged. IMPRESSION: 1. Decreased size of the previously treated left occipital lobe and posterior frontal lobe metastases with decreased surrounding edema. 2. New 3 mm focus of ill-defined enhancement in the right temporal lobe with minimal corresponding FLAIR signal abnormality suspicious for a new lesion. Electronically Signed: By: Valetta Mole M.D. On: 04/27/2022 17:33     ASSESSMENT/PLAN:  This is a very pleasant 71 year old African-American [name] diagnosed with stage IV (T3, N0, M1 B) non-small cell lung cancer, adenocarcinoma.  She presented with a right upper lobe lung mass with a solitary brain metastasis.  She was diagnosed in February 2022.  Her PD-L1 expression is 90%.  She does not have any actionable mutations.   The patient completed SRS followed by craniotomy and resection on  08/23/2020 under the care of Dr. Tammi Klippel and craniotomy under the care of Dr. Zada Finders on 08/24/2020.   She then had a robot-assisted right upper lobectomy with en bloc wedge resection of the right middle lobe and lymph node dissection under the care of Dr. Roxan Hockey on September 24, 2020.   She underwent SRS to the two new subcentimeter brain metastases on 11/29/20.   The patient is currently undergoing systemic chemotherapy with carboplatin for an AUC of 5, Alimta 500 mg per metered squared, Keytruda 200 mg IV every 3 weeks.  She is status post 25 cycles and tolerated it well without any adverse side effects.  Starting from cycle #5, the patient started maintenance Alimta and Keytruda.   She recently had progessive metastatic disease to the brain and is status post SRS which was completed on 11/18/21.  She also had the LITT brain procedure Dr. Brett Albino at [facility] in April 2023.   Labs were reviewed.  Recommend that she proceed cycle #26 today as scheduled.  We will see her back for follow-up visit in 3 weeks for evaluation and repeat blood work before undergoing cycle #27.  She  will continue to follow closely with radiation oncology neurooncology for her history of metastatic disease to the brain   I will make sure her labs are performed through her port-a-cath moving forward.   Advised to monitor for hemoptysis. If she developed recurrent/significant hemoptysis in the future, I would recommend ER evaluation.   The patient was advised to call immediately if she has any concerning symptoms in the interval. The patient voices understanding of current disease status and treatment options and is in agreement with the current care plan. All questions were answered. The patient knows to call the clinic with any problems, questions or concerns. We can certainly see the patient much sooner if necessary    No orders of the defined types were placed in this encounter.    The total time spent in the  appointment was 20-29 minutes.   [place], PA-C 05/21/22\

## 2022-05-17 ENCOUNTER — Other Ambulatory Visit: Payer: Self-pay | Admitting: Internal Medicine

## 2022-05-17 ENCOUNTER — Telehealth: Payer: Self-pay | Admitting: Internal Medicine

## 2022-05-17 NOTE — Telephone Encounter (Signed)
Called patient regarding November, December, January, February and March appointments. Left a voicemail.

## 2022-05-19 ENCOUNTER — Telehealth: Payer: Self-pay | Admitting: Internal Medicine

## 2022-05-19 NOTE — Telephone Encounter (Signed)
Avdoul with [facility] calls today in regards to recent RX for PT. PT had received both prescriptions for both ALPRAZolam (XANAX XR) 0.5 MG 24 hr tablet  and ALPRAZolam (XANAX) 0.5 MG tablet . They are wanting confirmation on which one of these needs to be filled. (Stated one of these was extended release and the other was the regular [employer]).  CB: [phone]

## 2022-05-19 NOTE — Telephone Encounter (Signed)
She is currently taking both the extended release and the short release.

## 2022-05-20 NOTE — Telephone Encounter (Signed)
Spoke with pharmacist today and info given.

## 2022-05-21 ENCOUNTER — Other Ambulatory Visit: Payer: Self-pay | Admitting: Internal Medicine

## 2022-05-21 ENCOUNTER — Inpatient Hospital Stay: Payer: Medicare HMO

## 2022-05-21 ENCOUNTER — Inpatient Hospital Stay (HOSPITAL_BASED_OUTPATIENT_CLINIC_OR_DEPARTMENT_OTHER): Payer: Medicare HMO | Admitting: Physician Assistant

## 2022-05-21 ENCOUNTER — Other Ambulatory Visit: Payer: Self-pay

## 2022-05-21 VITALS — BP 135/77 | HR 74 | Temp 98.2°F | Resp 18 | Ht 60.0 in | Wt 133.3 lb

## 2022-05-21 DIAGNOSIS — Z23 Encounter for immunization: Secondary | ICD-10-CM | POA: Diagnosis not present

## 2022-05-21 DIAGNOSIS — F32A Depression, unspecified: Secondary | ICD-10-CM | POA: Diagnosis not present

## 2022-05-21 DIAGNOSIS — C7951 Secondary malignant neoplasm of bone: Secondary | ICD-10-CM | POA: Diagnosis not present

## 2022-05-21 DIAGNOSIS — C7931 Secondary malignant neoplasm of brain: Secondary | ICD-10-CM | POA: Diagnosis not present

## 2022-05-21 DIAGNOSIS — C3411 Malignant neoplasm of upper lobe, right bronchus or lung: Secondary | ICD-10-CM

## 2022-05-21 DIAGNOSIS — Z5112 Encounter for antineoplastic immunotherapy: Secondary | ICD-10-CM | POA: Diagnosis not present

## 2022-05-21 DIAGNOSIS — F419 Anxiety disorder, unspecified: Secondary | ICD-10-CM | POA: Diagnosis not present

## 2022-05-21 DIAGNOSIS — Z5111 Encounter for antineoplastic chemotherapy: Secondary | ICD-10-CM

## 2022-05-21 DIAGNOSIS — E785 Hyperlipidemia, unspecified: Secondary | ICD-10-CM | POA: Diagnosis not present

## 2022-05-21 LAB — CBC WITH DIFFERENTIAL (CANCER CENTER ONLY)
Abs Immature Granulocytes: 0.01 10*3/uL (ref 0.00–0.07)
Basophils Absolute: 0 10*3/uL (ref 0.0–0.1)
Basophils Relative: 1 %
Eosinophils Absolute: 0 10*3/uL (ref 0.0–0.5)
Eosinophils Relative: 1 %
HCT: 34.6 % — ABNORMAL LOW (ref 36.0–46.0)
Hemoglobin: 10.8 g/dL — ABNORMAL LOW (ref 12.0–15.0)
Immature Granulocytes: 0 %
Lymphocytes Relative: 37 %
Lymphs Abs: 1.5 10*3/uL (ref 0.7–4.0)
MCH: 28.5 pg (ref 26.0–34.0)
MCHC: 31.2 g/dL (ref 30.0–36.0)
MCV: 91.3 fL (ref 80.0–100.0)
Monocytes Absolute: 0.4 10*3/uL (ref 0.1–1.0)
Monocytes Relative: 10 %
Neutro Abs: 2.1 10*3/uL (ref 1.7–7.7)
Neutrophils Relative %: 51 %
Platelet Count: 317 10*3/uL (ref 150–400)
RBC: 3.79 MIL/uL — ABNORMAL LOW (ref 3.87–5.11)
RDW: 13.2 % (ref 11.5–15.5)
WBC Count: 4.1 10*3/uL (ref 4.0–10.5)
nRBC: 0 % (ref 0.0–0.2)

## 2022-05-21 LAB — CMP (CANCER CENTER ONLY)
ALT: 14 U/L (ref 0–44)
AST: 26 U/L (ref 15–41)
Albumin: 3.8 g/dL (ref 3.5–5.0)
Alkaline Phosphatase: 71 U/L (ref 38–126)
Anion gap: 6 (ref 5–15)
BUN: 12 mg/dL (ref 8–23)
CO2: 30 mmol/L (ref 22–32)
Calcium: 9.8 mg/dL (ref 8.9–10.3)
Chloride: 107 mmol/L (ref 98–111)
Creatinine: 0.76 mg/dL (ref 0.44–1.00)
GFR, Estimated: >60 mL/min (ref >60)
Glucose, Bld: 124 mg/dL — ABNORMAL HIGH (ref 70–99)
Potassium: 4.1 mmol/L (ref 3.5–5.1)
Sodium: 143 mmol/L (ref 135–145)
Total Bilirubin: 0.2 mg/dL — ABNORMAL LOW (ref 0.3–1.2)
Total Protein: 6.7 g/dL (ref 6.5–8.1)

## 2022-05-21 LAB — TSH: TSH: 0.747 u[IU]/mL (ref 0.350–4.500)

## 2022-05-21 MED ORDER — SODIUM CHLORIDE 0.9 % IV SOLN
200.0000 mg | Freq: Once | INTRAVENOUS | Status: AC
  Administered 2022-05-21: 200 mg via INTRAVENOUS
  Filled 2022-05-21: qty 200

## 2022-05-21 MED ORDER — SODIUM CHLORIDE 0.9% FLUSH
10.0000 mL | INTRAVENOUS | Status: DC | PRN
  Administered 2022-05-21: 10 mL

## 2022-05-21 MED ORDER — SODIUM CHLORIDE 0.9 % IV SOLN: Freq: Once | INTRAVENOUS | Status: AC

## 2022-05-21 MED ORDER — HEPARIN SOD (PORK) LOCK FLUSH 100 UNIT/ML IV SOLN
500.0000 [IU] | Freq: Once | INTRAVENOUS | Status: AC | PRN
  Administered 2022-05-21: 500 [IU]

## 2022-05-21 MED ORDER — SODIUM CHLORIDE 0.9 % IV SOLN
500.0000 mg/m2 | Freq: Once | INTRAVENOUS | Status: AC
  Administered 2022-05-21: 800 mg via INTRAVENOUS
  Filled 2022-05-21: qty 20

## 2022-05-21 MED ORDER — PROCHLORPERAZINE MALEATE 10 MG PO TABS
10.0000 mg | ORAL_TABLET | Freq: Once | ORAL | Status: AC
  Administered 2022-05-21: 10 mg via ORAL
  Filled 2022-05-21: qty 1

## 2022-05-21 NOTE — Patient Instructions (Signed)
[facility]  Discharge Instructions: Thank you for choosing [facility] to provide your oncology and hematology care.   If you have a lab appointment with the [facility], please go directly to the [facility] and check in at the registration area.   Wear comfortable clothing and clothing appropriate for easy access to any Portacath or PICC line.   We strive to give you quality time with your provider. You may need to reschedule your appointment if you arrive late (15 or more minutes).  Arriving late affects you and other patients whose appointments are after yours.  Also, if you miss three or more appointments without notifying the office, you may be dismissed from the clinic at the provider's discretion.      For prescription refill requests, have your pharmacy contact our office and allow 72 hours for refills to be completed.    Today you received the following chemotherapy and/or immunotherapy agents: Keytruda/Alimta      To help prevent nausea and vomiting after your treatment, we encourage you to take your nausea medication as directed.  BELOW ARE SYMPTOMS THAT SHOULD BE REPORTED IMMEDIATELY: *FEVER GREATER THAN 100.4 F (38 C) OR HIGHER *CHILLS OR SWEATING *NAUSEA AND VOMITING THAT IS NOT CONTROLLED WITH YOUR NAUSEA MEDICATION *UNUSUAL SHORTNESS OF BREATH *UNUSUAL BRUISING OR BLEEDING *URINARY PROBLEMS (pain or burning when urinating, or frequent urination) *BOWEL PROBLEMS (unusual diarrhea, constipation, pain near the anus) TENDERNESS IN MOUTH AND THROAT WITH OR WITHOUT PRESENCE OF ULCERS (sore throat, sores in mouth, or a toothache) UNUSUAL RASH, SWELLING OR PAIN  UNUSUAL VAGINAL DISCHARGE OR ITCHING   Items with * indicate a potential emergency and should be followed up as soon as possible or go to the Emergency Department if any problems should occur.  Please show the CHEMOTHERAPY ALERT CARD or IMMUNOTHERAPY ALERT CARD at  check-in to the Emergency Department and triage nurse.  Should you have questions after your visit or need to cancel or reschedule your appointment, please contact [facility]  Dept: [phone]  and follow the prompts.  Office hours are 8:00 a.m. to 4:30 p.m. Monday - Friday. Please note that voicemails left after 4:00 p.m. may not be returned until the following business day.  We are closed weekends and major holidays. You have access to a nurse at all times for urgent questions. Please call the main number to the clinic Dept: [phone] and follow the prompts.   For any non-urgent questions, you may also contact your provider using MyChart. We now offer e-Visits for anyone 79 and older to request care online for non-urgent symptoms. For details visit [email or web address].   Also download the MyChart app! Go to the app store, search "MyChart", open the app, select [facility], and log in with your MyChart username and password.  Masks are optional in the cancer centers. If you would like for your care team to wear a mask while they are taking care of you, please let them know. You may have one support person who is at least 73 years old accompany you for your appointments.

## 2022-05-26 ENCOUNTER — Other Ambulatory Visit: Payer: Self-pay | Admitting: *Deleted

## 2022-05-26 ENCOUNTER — Other Ambulatory Visit: Payer: Self-pay | Admitting: Internal Medicine

## 2022-05-26 MED ORDER — LACOSAMIDE 100 MG PO TABS: 1.0000 | ORAL_TABLET | Freq: Two times a day (BID) | ORAL | 3 refills | Status: DC

## 2022-05-26 MED ORDER — LEVETIRACETAM 100 MG/ML PO SOLN: ORAL | 0 refills | Status: DC

## 2022-05-28 ENCOUNTER — Telehealth: Payer: Self-pay

## 2022-05-28 ENCOUNTER — Encounter: Payer: Self-pay | Admitting: Internal Medicine

## 2022-05-28 ENCOUNTER — Other Ambulatory Visit: Payer: Self-pay

## 2022-05-28 ENCOUNTER — Ambulatory Visit (HOSPITAL_COMMUNITY)
Admission: RE | Admit: 2022-05-28 | Discharge: 2022-05-28 | Disposition: A | Discharge status: Home / Self Care | Payer: Medicare HMO | Source: Ambulatory Visit | Attending: Physician Assistant | Admitting: Physician Assistant

## 2022-05-28 ENCOUNTER — Inpatient Hospital Stay (HOSPITAL_BASED_OUTPATIENT_CLINIC_OR_DEPARTMENT_OTHER): Payer: Medicare HMO | Admitting: Physician Assistant

## 2022-05-28 ENCOUNTER — Inpatient Hospital Stay: Payer: Medicare HMO

## 2022-05-28 VITALS — BP 130/71 | HR 98 | Temp 98.4°F | Resp 15 | Wt 132.2 lb

## 2022-05-28 VITALS — BP 130/71 | HR 100 | Temp 98.4°F | Resp 15 | Wt 132.2 lb

## 2022-05-28 DIAGNOSIS — R234 Changes in skin texture: Secondary | ICD-10-CM | POA: Diagnosis not present

## 2022-05-28 DIAGNOSIS — R0789 Other chest pain: Secondary | ICD-10-CM

## 2022-05-28 DIAGNOSIS — Z5111 Encounter for antineoplastic chemotherapy: Secondary | ICD-10-CM | POA: Diagnosis not present

## 2022-05-28 DIAGNOSIS — C3411 Malignant neoplasm of upper lobe, right bronchus or lung: Secondary | ICD-10-CM

## 2022-05-28 DIAGNOSIS — Z95828 Presence of other vascular implants and grafts: Secondary | ICD-10-CM

## 2022-05-28 DIAGNOSIS — C3490 Malignant neoplasm of unspecified part of unspecified bronchus or lung: Secondary | ICD-10-CM | POA: Insufficient documentation

## 2022-05-28 DIAGNOSIS — F32A Depression, unspecified: Secondary | ICD-10-CM | POA: Diagnosis not present

## 2022-05-28 DIAGNOSIS — F419 Anxiety disorder, unspecified: Secondary | ICD-10-CM | POA: Diagnosis not present

## 2022-05-28 DIAGNOSIS — Z23 Encounter for immunization: Secondary | ICD-10-CM | POA: Diagnosis not present

## 2022-05-28 DIAGNOSIS — C7931 Secondary malignant neoplasm of brain: Secondary | ICD-10-CM | POA: Diagnosis not present

## 2022-05-28 DIAGNOSIS — C7951 Secondary malignant neoplasm of bone: Secondary | ICD-10-CM | POA: Diagnosis not present

## 2022-05-28 DIAGNOSIS — R8290 Unspecified abnormal findings in urine: Secondary | ICD-10-CM | POA: Diagnosis not present

## 2022-05-28 DIAGNOSIS — E785 Hyperlipidemia, unspecified: Secondary | ICD-10-CM | POA: Diagnosis not present

## 2022-05-28 DIAGNOSIS — R079 Chest pain, unspecified: Secondary | ICD-10-CM | POA: Diagnosis not present

## 2022-05-28 DIAGNOSIS — Z5112 Encounter for antineoplastic immunotherapy: Secondary | ICD-10-CM | POA: Diagnosis not present

## 2022-05-28 LAB — CBC WITH DIFFERENTIAL (CANCER CENTER ONLY)
Abs Immature Granulocytes: 0.01 10*3/uL (ref 0.00–0.07)
Basophils Absolute: 0 10*3/uL (ref 0.0–0.1)
Basophils Relative: 1 %
Eosinophils Absolute: 0 10*3/uL (ref 0.0–0.5)
Eosinophils Relative: 1 %
HCT: 29.7 % — ABNORMAL LOW (ref 36.0–46.0)
Hemoglobin: 9.5 g/dL — ABNORMAL LOW (ref 12.0–15.0)
Immature Granulocytes: 0 %
Lymphocytes Relative: 33 %
Lymphs Abs: 1.3 10*3/uL (ref 0.7–4.0)
MCH: 28.9 pg (ref 26.0–34.0)
MCHC: 32 g/dL (ref 30.0–36.0)
MCV: 90.3 fL (ref 80.0–100.0)
Monocytes Absolute: 0.5 10*3/uL (ref 0.1–1.0)
Monocytes Relative: 12 %
Neutro Abs: 2.1 10*3/uL (ref 1.7–7.7)
Neutrophils Relative %: 53 %
Platelet Count: 179 10*3/uL (ref 150–400)
RBC: 3.29 MIL/uL — ABNORMAL LOW (ref 3.87–5.11)
RDW: 12.5 % (ref 11.5–15.5)
WBC Count: 4 10*3/uL (ref 4.0–10.5)
nRBC: 0 % (ref 0.0–0.2)

## 2022-05-28 LAB — CMP (CANCER CENTER ONLY)
ALT: 17 U/L (ref 0–44)
AST: 31 U/L (ref 15–41)
Albumin: 2.9 g/dL — ABNORMAL LOW (ref 3.5–5.0)
Alkaline Phosphatase: 63 U/L (ref 38–126)
Anion gap: 8 (ref 5–15)
BUN: 11 mg/dL (ref 8–23)
CO2: 26 mmol/L (ref 22–32)
Calcium: 9.3 mg/dL (ref 8.9–10.3)
Chloride: 104 mmol/L (ref 98–111)
Creatinine: 0.73 mg/dL (ref 0.44–1.00)
GFR, Estimated: >60 mL/min (ref >60)
Glucose, Bld: 126 mg/dL — ABNORMAL HIGH (ref 70–99)
Potassium: 3.8 mmol/L (ref 3.5–5.1)
Sodium: 138 mmol/L (ref 135–145)
Total Bilirubin: <0.1 mg/dL — ABNORMAL LOW (ref 0.3–1.2)
Total Protein: 6.7 g/dL (ref 6.5–8.1)

## 2022-05-28 MED ORDER — HEPARIN SOD (PORK) LOCK FLUSH 100 UNIT/ML IV SOLN
500.0000 [IU] | Freq: Once | INTRAVENOUS | Status: AC
  Administered 2022-05-28: 500 [IU] via INTRAVENOUS

## 2022-05-28 MED ORDER — SODIUM CHLORIDE 0.9 % IV SOLN: Freq: Once | INTRAVENOUS | Status: AC

## 2022-05-28 MED ORDER — SODIUM CHLORIDE 0.9% FLUSH
10.0000 mL | Freq: Once | INTRAVENOUS | Status: AC
  Administered 2022-05-28: 10 mL via INTRAVENOUS

## 2022-05-28 NOTE — Telephone Encounter (Signed)
Spoke with pts daughter and she has stated the pt is complaining about have pains in the area where she had her port placed yesterday 05/28/2022. Pts daughter was wanting to know if provider can see her today to tomorrow for this chest pain? I was able to inform the pts daughter the provider does not have any availability at this time.  **I also informed the pts daughter to please call the cancer center where the port was placed to see if they can see her mom today as no provider in our office have availability. Pts daughter states she will call the caner center.  **Her daughter is asking that the nurse look into seeing if provider can see her mom ASAP for thr port/chest pain? [phone]

## 2022-05-28 NOTE — Telephone Encounter (Signed)
Spoke with [name] today and info given.

## 2022-05-28 NOTE — Progress Notes (Signed)
Orders entered for urine collection tomorrow, 11/30. Per lab tech, patient can drop off urine sample tomorrow. Urine cup, hat, wipes and bag provided for patient to provide urine sample from home.

## 2022-05-28 NOTE — Progress Notes (Signed)
Symptom Management Consult note [place]    Patient Care Team: [name], MD as PCP - General (Internal Medicine) Minus Breeding, MD as PCP - Cardiology (Cardiology) Charlton Haws, [facility] as Pharmacist (Pharmacist) [place], P.A. as Consulting Physician (Ophthalmology)    Name of the patient: Hannah [name]  [id number]  [birth year]   Date of visit: 05/28/2022   Chief Complaint/Reason for visit: chest pain   Current Therapy: Pembrolizumab and pemetrexed  Last treatment:  Day 1   Cycle 28 on 05/21/22   ASSESSMENT & PLAN: Patient is a 71 y.o. female  with oncologic history of stage IV non-small cell lunge cancer, adenocarcinoma followed by Dr. Julien Nordmann.  I have viewed most recent oncology note and lab work.    #) stage IV non-small cell lunge cancer, adenocarcinoma - Next appointment with oncologist is 06/12/22   #) Atypical chest pain -Patient is well-appearing, no acute distress.  She is afebrile and hemodynamically stable.  No hypoxia.  Clear lung exam.  Patient with reproducible pain located over sternum.  -CBC with hemoglobin 9.5, slightly lower than baseline 10-12 based on chart review.  No indication for transfusion. -CMP overall unremarkable. -Patient ambulated in clinic without hypoxia. -EKG performed and is similar compared to recent.  No STEMI. -Chest xray shows no acute infectious processes or cardiomegaly.  I viewed imaging and agree with radiologist impression. -Engaged in shared decision making with patient and her daughter.  HPI and exam are suggestive of MSK strain.  In order to rule out life-threatening causes of chest pain she would need ED evaluation.  They would like to try close observation and over-the-counter symptom management which I feel is reasonable based on work-up today.  Lengthy discussion had about ED precautions.  #) Breast erythema -Patient missed her mammogram last year because she was ill. She has  history of dense breast tissue. -Exam shows large erythematous macules on bilateral breast without skin dimpling and unremarkable nipples ongoing >1 yr. No signs of acute infection. Patient has dense breast tissue, no masses  or lymphadenopathy appreciated. -PCP is aware of this and recommended over-the-counter cortisone cream.  Patient does not think this is helped. -Encourage patient to have mammogram as soon as possible for further evaluation of this. Patient advised to follow up sooner if she develops any breast changes  #) Malodorous urine -Patient reported urine was malodorous over the last several days.  She attempted to give urine sample however lab had already closed.  She will provide urine sample tomorrow, the order has been placed for this.  If needed antibiotic therapy will be prescribed.  #) poor  p.o. intake -Clinically patient appears dehydrated with dry mucous membranes.  She was given Liter of IV fluids in clinic. -CMP reveals normal kidney function   Heme/Onc History: Oncology History  Primary cancer of right upper lobe of lung ([place])  08/07/2020 Initial Diagnosis   Primary cancer of right upper lobe of lung ([place])   09/20/2020 Cancer Staging   Staging form: Lung, AJCC 8th Edition - Clinical: Stage IVB (cT3, cN0, cM1c) - Signed by Curt Bears, MD on 09/20/2020   11/08/2020 - 01/16/2022 Chemotherapy   Patient is on Treatment Plan : LUNG CARBOplatin / Pemetrexed / Pembrolizumab q21d Induction x 4 cycles / Maintenance Pemetrexed + Pembrolizumab     11/16/2020 -  Chemotherapy   Patient is on Treatment Plan : LUNG Carboplatin (5) + Pemetrexed (500) + Pembrolizumab (200) D1 q21d Induction x  4 cycles / Maintenance Pemetrexed (500) + Pembrolizumab (200) D1 q21d         Interval history-: Hannah [name] is a 71 y.o. female with oncologic history as above presenting to [facility] today with chief complaint of pain x1 week.  She is accompanied to clinic today by her daughter who  provides additional history.  Patient states the pain has been ongoing x1 week.  Pain is intermittent and located in the center of her chest.  Pain is worse after taking multiple deep breaths.  Pain is currently 4 out of 10 in severity.  She has been taking Tylenol for the pain although none today.  Tylenol does give pain relief.  Patient admits the pain started after moving a large box of picture frames which she pulled and pushed.  Patient denies any shortness of breath, diaphoresis, palpitations or syncope.  Cardiac history includes right bundle branch block, carotid stenosis, PVD, PVD, PAD, nonrheumatic aortic valve insufficiency.  She is not on blood thinners and denies MI or CVA.  She is followed by cardiologist Dr. Percival Spanish.  Patient states because of the chest pain she has not wanted to move around as much this week which has caused her to have poor p.o. intake.  She denies any associated nausea or abdominal pain.  Patient also reports redness on both breasts.  She reports her PCP is aware of this and advised her to use cortisone cream.  This did not improve redness.  She has bilateral breast soreness occasionally, none now.  Her daughter is also aware of this.  She states patient missed her mammogram last year because she was ill and did not reschedule it yet.  Patient has a history of dense breast tissue.  The sides the overlying redness she denies any skin changes or dimpling, nipple pain or discharge.  Patient also mentioned malodorous urine over the last several days.  Denies any urinary frequency, dysuria or gross hematuria.  Denies history of frequent UTIs.  Urine is slightly darker in color. Denies fever or chills.   ROS  All other systems are reviewed and are negative for acute change except as noted in the HPI.    Allergies  Allergen Reactions   Amlodipine Swelling and Rash    Rash, swelling   Chantix [Varenicline Tartrate] Shortness Of Breath, Swelling and Other (See Comments)     Tongue swell,sob   Clarithromycin Rash   Lisinopril Hives   Simvastatin Hives   Wellbutrin [Bupropion Hcl] Hives   Lipitor [Atorvastatin]     Dizziness per patient   Sertraline     Makes her feel like she is going to kill someone     Past Medical History:  Diagnosis Date   Allergy    Anemia    Anxiety    Arthritis    Carpal tunnel syndrome    Constipation    senna C stool softeners help    Depression    Diverticulosis    Dyslipidemia    External hemorrhoids    GERD (gastroesophageal reflux disease)    Heart murmur    mild-moderate AR   Hiatal hernia    Hyperlipidemia    on meds    Hypertension    Internal hemorrhoids    lung ca with brain mets 07/2020   Osteoarthritis    Pre-diabetes    PVD (peripheral vascular disease) (HCC)    moderate carotid disease   RBBB    Smoker    Vocal cord polyps  Past Surgical History:  Procedure Laterality Date   ABDOMINAL HYSTERECTOMY  7672   APPLICATION OF CRANIAL NAVIGATION N/A 08/24/2020   Procedure: APPLICATION OF CRANIAL NAVIGATION;  Surgeon: Judith Part, MD;  Location: [place];  Service: Neurosurgery;  Laterality: N/A;   COLONOSCOPY     COLONOSCOPY W/ POLYPECTOMY  2009   CRANIOTOMY Left 08/24/2020   Procedure: Left Craniotomy for Tumor Resection with Brainlab;  Surgeon: Judith Part, MD;  Location: [place];  Service: Neurosurgery;  Laterality: Left;   HEMORRHOID SURGERY     INTERCOSTAL NERVE BLOCK Right 09/24/2020   Procedure: INTERCOSTAL NERVE BLOCK;  Surgeon: Melrose Nakayama, MD;  Location: [place];  Service: Thoracic;  Laterality: Right;   IR IMAGING GUIDED PORT INSERTION  05/15/2022   JOINT REPLACEMENT     LYMPH NODE DISSECTION Right 09/24/2020   Procedure: LYMPH NODE DISSECTION;  Surgeon: Melrose Nakayama, MD;  Location: [place];  Service: Thoracic;  Laterality: Right;   POLYPECTOMY     right total knee arthroplasty     Dr. Emeterio Reeve 06-04-18   [place]  08/16/2009   TOTAL KNEE ARTHROPLASTY  Left 02/13/2014   Procedure: TOTAL KNEE ARTHROPLASTY;  Surgeon: Alta Corning, MD;  Location: [place];  Service: Orthopedics;  Laterality: Left;   TOTAL KNEE ARTHROPLASTY Right 06/04/2018   Procedure: RIGHT TOTAL KNEE ARTHROPLASTY;  Surgeon: Dorna Leitz, MD;  Location: [facility];  Service: Orthopedics;  Laterality: Right;  Adductor Block   TUBAL LIGATION      Social History   Socioeconomic History   Marital status: Married    Spouse name: Not on file   Number of children: 2   Years of education: Not on file   Highest education level: Not on file  Occupational History   Not on file  Tobacco Use   Smoking status: Former    Packs/day: 0.25    Years: 40.00    Total pack years: 10.00    Types: Cigarettes    Quit date: 07/09/2020    Years since quitting: 1.8    Passive exposure: Never   Smokeless tobacco: Never   Tobacco comments:    PACK WILL LAST 3 days  Vaping Use   Vaping Use: Never used  Substance and Sexual Activity   Alcohol use: No   Drug use: No   Sexual activity: Not Currently  Other Topics Concern   Not on file  Social History Narrative   Not on file   Social Determinants of Health   Financial Resource Strain: Low Risk  (04/14/2022)   Overall Financial Resource Strain (CARDIA)    Difficulty of Paying Living Expenses: Not hard at all  Food Insecurity: No Food Insecurity (04/14/2022)   Hunger Vital Sign    Worried About Running Out of Food in the Last Year: Never true    [place] in the Last Year: Never true  Transportation Needs: No Transportation Needs (04/14/2022)   PRAPARE - Hydrologist (Medical): No    Lack of Transportation (Non-Medical): No  Physical Activity: Insufficiently Active (04/14/2022)   Exercise Vital Sign    Days of Exercise per Week: 2 days    Minutes of Exercise per Session: 60 min  Stress: No Stress Concern Present (04/14/2022)   [place]     Feeling of Stress : Not at all  Social Connections: [place] (04/14/2022)   Social Connection and Isolation Panel [NHANES]  Frequency of Communication with Friends and Family: More than three times a week    Frequency of Social Gatherings with Friends and Family: More than three times a week    Attends Religious Services: More than 4 times per year    Active Member of [employer] or Organizations: Yes    Attends Music therapist: More than 4 times per year    Marital Status: Married  Human resources officer Violence: Not At Risk (04/14/2022)   Humiliation, Afraid, Rape, and Kick questionnaire    Fear of Current or Ex-Partner: No    Emotionally Abused: No    Physically Abused: No    Sexually Abused: No    Family History  Problem Relation Age of Onset   Cancer Father    Prostate cancer Father    Diabetes Sister    Cancer Sister    Stroke Maternal Grandfather    Colitis Maternal Aunt    Breast cancer Maternal Aunt    Colon cancer Neg Hx    Colon polyps Neg Hx    Rectal cancer Neg Hx    Stomach cancer Neg Hx    Esophageal cancer Neg Hx      Current Outpatient Medications:    acetaminophen (TYLENOL) 325 MG tablet, Take 325 mg by mouth every 6 (six) hours as needed for moderate pain., Disp: , Rfl:    ALPRAZolam (XANAX XR) 0.5 MG 24 hr tablet, Take 1 tablet (0.5 mg total) by mouth daily., Disp: 30 tablet, Rfl: 5   ALPRAZolam (XANAX) 0.5 MG tablet, TAKE 1 TABLET BY MOUTH THREE TIMES DAILY AS NEEDED FOR ANXIETY, Disp: 90 tablet, Rfl: 0   aspirin 81 MG tablet, Take 1 tablet (81 mg total) by mouth daily. Restart on 08/31/20, Disp: 30 tablet, Rfl:    bisacodyl (DULCOLAX) 5 MG EC tablet, Take 5 mg by mouth daily as needed for moderate constipation., Disp: , Rfl:    carboxymethylcellulose (REFRESH PLUS) 0.5 % SOLN, Place 1-2 drops into both eyes 3 (three) times daily as needed (dry eyes)., Disp: , Rfl:    Cholecalciferol (D3 PO), Take 1 capsule by mouth daily., Disp: , Rfl:     citalopram (CELEXA) 10 MG tablet, Take 1 tablet by mouth once daily, Disp: 90 tablet, Rfl: 0   fluticasone (FLONASE) 50 MCG/ACT nasal spray, PLACE 2 SPRAYS INTO BOTH NOSTRILS DAILY. (Patient taking differently: Place 2 sprays into both nostrils daily as needed for allergies.), Disp: 48 g, Rfl: 1   folic acid (FOLVITE) 1 MG tablet, Take 1 tablet by mouth once daily, Disp: 30 tablet, Rfl: 0   Lacosamide 100 MG TABS, Take 1 tablet (100 mg total) by mouth 2 (two) times daily., Disp: 60 tablet, Rfl: 3   levETIRAcetam (KEPPRA) 100 MG/ML solution, TAKE 15 ML BY MOUTH  TWICE DAILY, Disp: 473 mL, Rfl: 0   lidocaine-prilocaine (EMLA) cream, Apply 1 Application topically as needed., Disp: 30 g, Rfl: 2   loratadine (CLARITIN) 10 MG tablet, Take 10 mg by mouth daily as needed for allergies., Disp: , Rfl:    magnesium hydroxide (MILK OF MAGNESIA) 800 MG/5ML suspension, Take 30 mLs by mouth daily as needed for constipation., Disp: , Rfl:    Menthol, Topical Analgesic, (BIOFREEZE EX), Apply 1 application topically as needed (pain)., Disp: , Rfl:    mirtazapine (REMERON) 15 MG tablet, Take 0.5 tablets (7.5 mg total) by mouth at bedtime., Disp: 90 tablet, Rfl: 2   olopatadine (PATANOL) 0.1 % ophthalmic solution, Place 1 drop into both  eyes 2 (two) times daily as needed for allergies., Disp: , Rfl:    omeprazole (PRILOSEC) 40 MG capsule, TAKE 1 CAPSULE TWICE DAILY BEFORE MEALS (Patient taking differently: Take 40 mg by mouth daily.), Disp: 180 capsule, Rfl: 0   polyethylene glycol (MIRALAX / GLYCOLAX) 17 g packet, Take 17 g by mouth 2 (two) times daily., Disp: 72 each, Rfl: 0   pravastatin (PRAVACHOL) 40 MG tablet, Take 1 tablet (40 mg total) by mouth every evening., Disp: 90 tablet, Rfl: 3   Probiotic Product (PROBIOTIC DAILY PO), Take 1 capsule by mouth daily., Disp: , Rfl:    prochlorperazine (COMPAZINE) 10 MG tablet, TAKE 1 TABLET BY MOUTH EVERY 6 HOURS AS NEEDED FOR NAUSEA OR VOMITING, Disp: 30 tablet, Rfl: 0    simethicone (MYLICON) 80 MG chewable tablet, Chew 1 tablet (80 mg total) by mouth 4 (four) times daily as needed for flatulence (Bloating)., Disp: 30 tablet, Rfl: 0  PHYSICAL EXAM: ECOG FS:1 - Symptomatic but completely ambulatory    Vitals:   05/28/22 1545  BP: 130/71  Pulse: 100  Resp: 15  Temp: 98.4 F (36.9 C)  SpO2: 98%  Weight: 132 lb 3.2 oz (60 kg)   Physical Exam Vitals and nursing note reviewed.  Constitutional:      Appearance: Normal appearance. She is well-developed. She is not ill-appearing, toxic-appearing or diaphoretic.     Comments: Thin appearing female  HENT:     Head: Normocephalic.     Nose: Nose normal.  Eyes:     Conjunctiva/sclera: Conjunctivae normal.  Neck:     Vascular: No JVD.  Cardiovascular:     Rate and Rhythm: Normal rate and regular rhythm.     Pulses: Normal pulses.          Radial pulses are 2+ on the right side and 2+ on the left side.     Heart sounds: Murmur heard.     Comments: Soft outflow tract murmur Pulmonary:     Effort: Pulmonary effort is normal. No respiratory distress.     Breath sounds: Normal breath sounds. No stridor. No wheezing, rhonchi or rales.  Chest:  Breasts:    Right: No swelling, inverted nipple, mass, nipple discharge or tenderness.     Left: No swelling, inverted nipple, mass, nipple discharge or tenderness.       Comments: PAC in right upper chest without signs of surrounding infection.    Abdominal:     General: There is no distension.  Musculoskeletal:     Cervical back: Normal range of motion.     Right lower leg: No edema.     Left lower leg: No edema.  Lymphadenopathy:     Cervical: No cervical adenopathy.     Upper Body:     Right upper body: No supraclavicular, axillary or pectoral adenopathy.     Left upper body: No supraclavicular, axillary or pectoral adenopathy.  Skin:    General: Skin is warm and dry.     Comments: Equal tactile temperatures in all extremities  Neurological:      Mental Status: She is oriented to person, place, and time.        LABORATORY DATA: I have reviewed the data as listed    Latest Ref Rng & Units 05/28/2022    3:31 PM 05/21/2022    7:58 AM 05/01/2022    9:39 AM  CBC  WBC 4.0 - 10.5 K/uL 4.0  4.1  4.0   Hemoglobin 12.0 - 15.0 g/dL 9.5  10.8  11.1   Hematocrit 36.0 - 46.0 % 29.7  34.6  35.4   Platelets 150 - 400 K/uL 179  317  282         Latest Ref Rng & Units 05/28/2022    3:21 PM 05/21/2022    7:58 AM 05/01/2022    9:39 AM  CMP  Glucose 70 - 99 mg/dL 126  124  95   BUN 8 - 23 mg/dL 11  12  14    Creatinine 0.44 - 1.00 mg/dL 0.73  0.76  0.75   Sodium 135 - 145 mmol/L 138  143  142   Potassium 3.5 - 5.1 mmol/L 3.8  4.1  4.6   Chloride 98 - 111 mmol/L 104  107  107   CO2 22 - 32 mmol/L 26  30  31    Calcium 8.9 - 10.3 mg/dL 9.3  9.8  9.5   Total Protein 6.5 - 8.1 g/dL 6.7  6.7  6.8   Total Bilirubin 0.3 - 1.2 mg/dL <0.1  0.2  0.3   Alkaline Phos 38 - 126 U/L 63  71  71   AST 15 - 41 U/L 31  26  27    ALT 0 - 44 U/L 17  14  16         RADIOGRAPHIC STUDIES (from last 24 hours if applicable) I have personally reviewed the radiological images as listed and agreed with the findings in the report. DG Chest 2 View  Result Date: 05/28/2022 CLINICAL DATA:  Chest pain EXAM: CHEST - 2 VIEW COMPARISON:  04/29/2022, 10/27/2021 FINDINGS: Interval placement of right IJ approach chest port with distal tip terminating at the superior cavoatrial junction. Normal heart size. Postsurgical changes to the right lung with volume loss and right basilar scarring. No focal airspace consolidation, pleural effusion, or pneumothorax. IMPRESSION: No active cardiopulmonary disease. Electronically Signed   By: Davina Poke D.O.   On: 05/28/2022 17:34        Visit Diagnosis: 1. Malignant neoplasm of unspecified part of unspecified bronchus or lung ([place])   2. Malodorous urine   3. Malignant neoplasm of upper lobe of right lung ([place])   4. Atypical  chest pain   5. Breast skin changes      Orders Placed This Encounter  Procedures   DG Chest 2 View    Standing Status:   Future    Number of Occurrences:   1    Standing Expiration Date:   05/28/2023    Order Specific Question:   Reason for Exam (SYMPTOM  OR DIAGNOSIS REQUIRED)    Answer:   cp    Order Specific Question:   Preferred imaging location?    Answer:   [facility]    All questions were answered. The patient knows to call the clinic with any problems, questions or concerns. No barriers to learning was detected.  I have spent a total of 30 minutes minutes of face-to-face and non-face-to-face time, preparing to see the patient, obtaining and/or reviewing separately obtained history, performing a medically appropriate examination, counseling and educating the patient, ordering tests, documenting clinical information in the electronic health record, and care coordination (communications with other health care professionals or caregivers).    Thank you for allowing me to participate in the care of this patient.    Barrie Folk, PA-C Department of Hematology/Oncology [facility] Phone: [phone]  [phone]    05/28/2022 6:21 PM

## 2022-05-28 NOTE — Patient Instructions (Signed)
Rehydration  Rehydration is the replacement of fluids, salts, and minerals in the body (electrolytes) that are lost during dehydration. Dehydration is when there is not enough water or other fluids in the body. This happens when you lose more fluids than you take in. People who are age 73 or older have a higher risk of dehydration than younger adults. This is because in older age, the body: Is less able to maintain the right amount of water. Does not respond to temperature changes as well. Does not get a sense of thirst as easily or quickly. Other causes include: Not drinking enough fluids. This can occur when you are ill, when you forget to drink, or when you are doing activities that require a lot of energy, especially in hot weather. Conditions that cause loss of water or other fluids. These include diarrhea, vomiting, sweating, or urinating a lot. Other illnesses, such as fever or infection. Certain medicines, such as those that remove excess fluid from the body (diuretics). Symptoms of mild or moderate dehydration may include thirst, dry lips and mouth, and dizziness. Symptoms of severe dehydration may include increased heart rate, confusion, fainting, and not urinating. In severe cases, you may need to get fluids through an IV at the hospital. For mild or moderate cases, you can usually rehydrate at home by drinking certain fluids as told by your health care provider. What are the risks? Rehydration is usually safe. Taking in too much fluid (overhydration) can be a problem but is rare. Overhydration can cause an imbalance of electrolytes in the body, kidney failure, fluid in the lungs, or a decrease in salt (sodium) levels in the body. Supplies needed: You will need an oral rehydration solution (ORS) if your health care provider tells you to use one. This is a drink to treat dehydration. It can be found in pharmacies and retail stores. How to rehydrate Fluids Follow instructions from your  health care provider about what to drink. The kind of fluid and the amount you should drink depend on your condition. In general, you should choose drinks that you prefer. If told by your health care provider, drink an ORS. Make an ORS by following instructions on the package. Start by drinking small amounts, about  cup (120 mL) every 5-10 minutes. Slowly increase how much you drink until you have taken in the amount recommended by your health care provider. Drink enough clear fluids to keep your urine pale yellow. If you were told to drink an ORS, finish it first, then start slowly drinking other clear fluids. Drink fluids such as: Water. This includes sparkling and flavored water. Drinking only water can lead to having too little sodium in your body (hyponatremia). Follow the advice of your health care provider. Water from ice chips you suck on. Fruit juice with water added to it(diluted). Sports drinks. Hot or cold herbal teas. Broth-based soups. Coffee. Milk or milk products. Food Follow instructions from your health care provider about what to eat while you rehydrate. Your health care provider may recommend that you slowly begin eating regular foods in small amounts. Eat foods that contain a healthy balance of electrolytes, such as bananas, oranges, potatoes, tomatoes, and spinach. Avoid foods that are greasy or contain a lot of sugar. In some cases, you may get nutrition through a feeding tube that is passed through your nose and into your stomach (nasogastric tube, or NG tube). This may be done if you have uncontrolled vomiting or diarrhea. Drinks to avoid  Certain  drinks may make dehydration worse. While you rehydrate, avoid drinking alcohol. How to tell if you are recovering from dehydration You may be getting better if: You are urinating more often than before you started rehydrating. Your urine is pale yellow. Your energy level improves. You vomit less often. You have diarrhea  less often. Your appetite improves or returns to normal. You feel less dizzy or light-headed. Your skin tone and color start to look more normal. Follow these instructions at home: Take over-the-counter and prescription medicines only as told by your health care provider. Do not take sodium tablets. Doing this can lead to having too much sodium in your body (hypernatremia). Contact a health care provider if: You continue to have symptoms of mild or moderate dehydration, such as: Thirst. Dry lips. Slightly dry mouth. Dizziness. Dark urine or less urine than usual. Muscle cramps. You continue to vomit or have diarrhea. Get help right away if: You have symptoms of dehydration that get worse. You have a fever. You have a severe headache. You have been vomiting and have problems, such as: Your vomiting gets worse. Your vomit includes blood or green matter (bile). You cannot eat or drink without vomiting. You have problems with urination or bowel movements, such as: Diarrhea that gets worse. Blood in your stool (feces). This may cause stool to look black and tarry. Not urinating, or urinating only a small amount of very dark urine, within 6-8 hours. You have trouble breathing. You have symptoms that get worse with treatment. These symptoms may be an emergency. Get help right away. Call 911. Do not wait to see if the symptoms will go away. Do not drive yourself to the hospital. This information is not intended to replace advice given to you by your health care provider. Make sure you discuss any questions you have with your health care provider. Document Revised: 10/30/2021 Document Reviewed: 10/28/2021 Elsevier Patient Education  [place].

## 2022-05-28 NOTE — Telephone Encounter (Signed)
Reached out to patient and spoke with daughter and offered appointment with Symptom Management today at 330pm.  They are in agreement with appointment. This nurse also verified that patients temperatures never went above 100.0 and today the reading is 98.9.   No further questions or concerns.

## 2022-05-28 NOTE — Telephone Encounter (Signed)
Ideally should see interventional radiology - they did the port to r/o a complication of the placement - they can evaluate port to see if it is clogged or other issue that I may not be able to evalute

## 2022-05-28 NOTE — Telephone Encounter (Signed)
This nurse received a message from this patient's daughter stating that the patient has not been feeling well since Thanksgiving.  She has been running a low grade temperature and just feeling a little weak.  She also states that the patient had a port placed two weeks ago and she is complaining of pain in the middle of her chest when she breathes in.  No further concerns noted at this time.  Forwarded to provider for recommendation.

## 2022-05-29 ENCOUNTER — Inpatient Hospital Stay: Payer: Medicare HMO

## 2022-05-29 DIAGNOSIS — C7951 Secondary malignant neoplasm of bone: Secondary | ICD-10-CM | POA: Diagnosis not present

## 2022-05-29 DIAGNOSIS — E785 Hyperlipidemia, unspecified: Secondary | ICD-10-CM | POA: Diagnosis not present

## 2022-05-29 DIAGNOSIS — C3411 Malignant neoplasm of upper lobe, right bronchus or lung: Secondary | ICD-10-CM

## 2022-05-29 DIAGNOSIS — Z23 Encounter for immunization: Secondary | ICD-10-CM | POA: Diagnosis not present

## 2022-05-29 DIAGNOSIS — Z5112 Encounter for antineoplastic immunotherapy: Secondary | ICD-10-CM | POA: Diagnosis not present

## 2022-05-29 DIAGNOSIS — Z5111 Encounter for antineoplastic chemotherapy: Secondary | ICD-10-CM | POA: Diagnosis not present

## 2022-05-29 DIAGNOSIS — C7931 Secondary malignant neoplasm of brain: Secondary | ICD-10-CM | POA: Diagnosis not present

## 2022-05-29 DIAGNOSIS — F32A Depression, unspecified: Secondary | ICD-10-CM | POA: Diagnosis not present

## 2022-05-29 DIAGNOSIS — F419 Anxiety disorder, unspecified: Secondary | ICD-10-CM | POA: Diagnosis not present

## 2022-05-29 LAB — URINALYSIS, COMPLETE (UACMP) WITH MICROSCOPIC
Bilirubin Urine: NEGATIVE
Glucose, UA: NEGATIVE mg/dL
Hgb urine dipstick: NEGATIVE
Ketones, ur: NEGATIVE mg/dL
Leukocytes,Ua: NEGATIVE
Nitrite: NEGATIVE
Protein, ur: NEGATIVE mg/dL
Specific Gravity, Urine: 1.006 (ref 1.005–1.030)
pH: 7 (ref 5.0–8.0)

## 2022-05-30 ENCOUNTER — Other Ambulatory Visit: Payer: Self-pay | Admitting: Internal Medicine

## 2022-05-30 ENCOUNTER — Telehealth: Payer: Self-pay | Admitting: *Deleted

## 2022-05-30 ENCOUNTER — Encounter: Payer: Self-pay | Admitting: *Deleted

## 2022-05-30 ENCOUNTER — Telehealth: Payer: Self-pay

## 2022-05-30 ENCOUNTER — Encounter: Payer: Self-pay | Admitting: Internal Medicine

## 2022-05-30 DIAGNOSIS — C3411 Malignant neoplasm of upper lobe, right bronchus or lung: Secondary | ICD-10-CM

## 2022-05-30 LAB — URINE CULTURE

## 2022-05-30 MED ORDER — DEXAMETHASONE 1 MG PO TABS: 1.0000 mg | ORAL_TABLET | Freq: Every day | ORAL | 1 refills | Status: DC

## 2022-05-30 NOTE — Telephone Encounter (Signed)
RN called patient and spoke with her daughter, [name], per Sherol Dade, PA-C to inform her of urinalysis results. [name] verbalized understanding that urinalysis did not show a UTI.

## 2022-05-30 NOTE — Telephone Encounter (Signed)
Patients daughter called to report that she has noticed some subtle changes in her mother's ability to find the appropriate word to complete a thought.  She was unsure if patient should restart her steroids.    Routed to Dr Mickeal Skinner to please advise.

## 2022-06-06 DIAGNOSIS — Z1231 Encounter for screening mammogram for malignant neoplasm of breast: Secondary | ICD-10-CM | POA: Diagnosis not present

## 2022-06-06 LAB — HM MAMMOGRAPHY

## 2022-06-12 ENCOUNTER — Inpatient Hospital Stay: Payer: Medicare HMO

## 2022-06-12 ENCOUNTER — Inpatient Hospital Stay: Payer: Medicare HMO | Attending: Internal Medicine | Admitting: Internal Medicine

## 2022-06-12 ENCOUNTER — Other Ambulatory Visit: Payer: Medicare HMO

## 2022-06-12 ENCOUNTER — Other Ambulatory Visit: Payer: Self-pay | Admitting: Internal Medicine

## 2022-06-12 ENCOUNTER — Other Ambulatory Visit: Payer: Self-pay

## 2022-06-12 VITALS — BP 144/69 | HR 76 | Temp 98.4°F | Resp 16

## 2022-06-12 DIAGNOSIS — R5383 Other fatigue: Secondary | ICD-10-CM | POA: Diagnosis not present

## 2022-06-12 DIAGNOSIS — F32A Depression, unspecified: Secondary | ICD-10-CM | POA: Insufficient documentation

## 2022-06-12 DIAGNOSIS — E785 Hyperlipidemia, unspecified: Secondary | ICD-10-CM | POA: Diagnosis not present

## 2022-06-12 DIAGNOSIS — Z87891 Personal history of nicotine dependence: Secondary | ICD-10-CM | POA: Diagnosis not present

## 2022-06-12 DIAGNOSIS — Z5111 Encounter for antineoplastic chemotherapy: Secondary | ICD-10-CM | POA: Diagnosis not present

## 2022-06-12 DIAGNOSIS — Z90710 Acquired absence of both cervix and uterus: Secondary | ICD-10-CM | POA: Diagnosis not present

## 2022-06-12 DIAGNOSIS — C3411 Malignant neoplasm of upper lobe, right bronchus or lung: Secondary | ICD-10-CM | POA: Insufficient documentation

## 2022-06-12 DIAGNOSIS — J984 Other disorders of lung: Secondary | ICD-10-CM | POA: Diagnosis not present

## 2022-06-12 DIAGNOSIS — I739 Peripheral vascular disease, unspecified: Secondary | ICD-10-CM | POA: Insufficient documentation

## 2022-06-12 DIAGNOSIS — C7931 Secondary malignant neoplasm of brain: Secondary | ICD-10-CM | POA: Diagnosis not present

## 2022-06-12 DIAGNOSIS — Z888 Allergy status to other drugs, medicaments and biological substances status: Secondary | ICD-10-CM | POA: Insufficient documentation

## 2022-06-12 DIAGNOSIS — Z5112 Encounter for antineoplastic immunotherapy: Secondary | ICD-10-CM | POA: Diagnosis not present

## 2022-06-12 DIAGNOSIS — F419 Anxiety disorder, unspecified: Secondary | ICD-10-CM | POA: Diagnosis not present

## 2022-06-12 DIAGNOSIS — R079 Chest pain, unspecified: Secondary | ICD-10-CM | POA: Diagnosis not present

## 2022-06-12 DIAGNOSIS — Z79899 Other long term (current) drug therapy: Secondary | ICD-10-CM | POA: Insufficient documentation

## 2022-06-12 DIAGNOSIS — Z8719 Personal history of other diseases of the digestive system: Secondary | ICD-10-CM | POA: Diagnosis not present

## 2022-06-12 LAB — CBC WITH DIFFERENTIAL (CANCER CENTER ONLY)
Abs Immature Granulocytes: 0.01 10*3/uL (ref 0.00–0.07)
Basophils Absolute: 0 10*3/uL (ref 0.0–0.1)
Basophils Relative: 1 %
Eosinophils Absolute: 0 10*3/uL (ref 0.0–0.5)
Eosinophils Relative: 1 %
HCT: 35.2 % — ABNORMAL LOW (ref 36.0–46.0)
Hemoglobin: 11.2 g/dL — ABNORMAL LOW (ref 12.0–15.0)
Immature Granulocytes: 0 %
Lymphocytes Relative: 33 %
Lymphs Abs: 1.4 10*3/uL (ref 0.7–4.0)
MCH: 28.8 pg (ref 26.0–34.0)
MCHC: 31.8 g/dL (ref 30.0–36.0)
MCV: 90.5 fL (ref 80.0–100.0)
Monocytes Absolute: 0.5 10*3/uL (ref 0.1–1.0)
Monocytes Relative: 13 %
Neutro Abs: 2.1 10*3/uL (ref 1.7–7.7)
Neutrophils Relative %: 52 %
Platelet Count: 319 10*3/uL (ref 150–400)
RBC: 3.89 MIL/uL (ref 3.87–5.11)
RDW: 12.9 % (ref 11.5–15.5)
WBC Count: 4.1 10*3/uL (ref 4.0–10.5)
nRBC: 0 % (ref 0.0–0.2)

## 2022-06-12 LAB — CMP (CANCER CENTER ONLY)
ALT: 10 U/L (ref 0–44)
AST: 22 U/L (ref 15–41)
Albumin: 3.7 g/dL (ref 3.5–5.0)
Alkaline Phosphatase: 81 U/L (ref 38–126)
Anion gap: 5 (ref 5–15)
BUN: 12 mg/dL (ref 8–23)
CO2: 32 mmol/L (ref 22–32)
Calcium: 10 mg/dL (ref 8.9–10.3)
Chloride: 106 mmol/L (ref 98–111)
Creatinine: 0.7 mg/dL (ref 0.44–1.00)
GFR, Estimated: >60 mL/min (ref >60)
Glucose, Bld: 113 mg/dL — ABNORMAL HIGH (ref 70–99)
Potassium: 4.2 mmol/L (ref 3.5–5.1)
Sodium: 143 mmol/L (ref 135–145)
Total Bilirubin: 0.2 mg/dL — ABNORMAL LOW (ref 0.3–1.2)
Total Protein: 6.4 g/dL — ABNORMAL LOW (ref 6.5–8.1)

## 2022-06-12 LAB — TSH: TSH: 0.653 u[IU]/mL (ref 0.350–4.500)

## 2022-06-12 MED ORDER — HEPARIN SOD (PORK) LOCK FLUSH 100 UNIT/ML IV SOLN
500.0000 [IU] | Freq: Once | INTRAVENOUS | Status: AC | PRN
  Administered 2022-06-12: 500 [IU]

## 2022-06-12 MED ORDER — CYANOCOBALAMIN 1000 MCG/ML IJ SOLN
1000.0000 ug | Freq: Once | INTRAMUSCULAR | Status: AC
  Administered 2022-06-12: 1000 ug via INTRAMUSCULAR
  Filled 2022-06-12: qty 1

## 2022-06-12 MED ORDER — SODIUM CHLORIDE 0.9 % IV SOLN: Freq: Once | INTRAVENOUS | Status: AC

## 2022-06-12 MED ORDER — PROCHLORPERAZINE MALEATE 10 MG PO TABS
10.0000 mg | ORAL_TABLET | Freq: Once | ORAL | Status: AC
  Administered 2022-06-12: 10 mg via ORAL
  Filled 2022-06-12: qty 1

## 2022-06-12 MED ORDER — SODIUM CHLORIDE 0.9 % IV SOLN
200.0000 mg | Freq: Once | INTRAVENOUS | Status: AC
  Administered 2022-06-12: 200 mg via INTRAVENOUS
  Filled 2022-06-12: qty 200

## 2022-06-12 MED ORDER — SODIUM CHLORIDE 0.9% FLUSH
10.0000 mL | INTRAVENOUS | Status: DC | PRN
  Administered 2022-06-12: 10 mL

## 2022-06-12 MED ORDER — SODIUM CHLORIDE 0.9 % IV SOLN
500.0000 mg/m2 | Freq: Once | INTRAVENOUS | Status: AC
  Administered 2022-06-12: 800 mg via INTRAVENOUS
  Filled 2022-06-12: qty 20

## 2022-06-12 MED ORDER — SODIUM CHLORIDE 0.9% FLUSH
10.0000 mL | INTRAVENOUS | Status: DC | PRN
  Administered 2022-06-12: 10 mL via INTRAVENOUS

## 2022-06-12 NOTE — Patient Instructions (Signed)
[facility]  Discharge Instructions: Thank you for choosing [facility] to provide your oncology and hematology care.   If you have a lab appointment with the [facility], please go directly to the [facility] and check in at the registration area.   Wear comfortable clothing and clothing appropriate for easy access to any Portacath or PICC line.   We strive to give you quality time with your provider. You may need to reschedule your appointment if you arrive late (15 or more minutes).  Arriving late affects you and other patients whose appointments are after yours.  Also, if you miss three or more appointments without notifying the office, you may be dismissed from the clinic at the provider's discretion.      For prescription refill requests, have your pharmacy contact our office and allow 72 hours for refills to be completed.    Today you received the following chemotherapy and/or immunotherapy agents: Keytruda/Alimta      To help prevent nausea and vomiting after your treatment, we encourage you to take your nausea medication as directed.  BELOW ARE SYMPTOMS THAT SHOULD BE REPORTED IMMEDIATELY: *FEVER GREATER THAN 100.4 F (38 C) OR HIGHER *CHILLS OR SWEATING *NAUSEA AND VOMITING THAT IS NOT CONTROLLED WITH YOUR NAUSEA MEDICATION *UNUSUAL SHORTNESS OF BREATH *UNUSUAL BRUISING OR BLEEDING *URINARY PROBLEMS (pain or burning when urinating, or frequent urination) *BOWEL PROBLEMS (unusual diarrhea, constipation, pain near the anus) TENDERNESS IN MOUTH AND THROAT WITH OR WITHOUT PRESENCE OF ULCERS (sore throat, sores in mouth, or a toothache) UNUSUAL RASH, SWELLING OR PAIN  UNUSUAL VAGINAL DISCHARGE OR ITCHING   Items with * indicate a potential emergency and should be followed up as soon as possible or go to the Emergency Department if any problems should occur.  Please show the CHEMOTHERAPY ALERT CARD or IMMUNOTHERAPY ALERT CARD at  check-in to the Emergency Department and triage nurse.  Should you have questions after your visit or need to cancel or reschedule your appointment, please contact [facility]  Dept: [phone]  and follow the prompts.  Office hours are 8:00 a.m. to 4:30 p.m. Monday - Friday. Please note that voicemails left after 4:00 p.m. may not be returned until the following business day.  We are closed weekends and major holidays. You have access to a nurse at all times for urgent questions. Please call the main number to the clinic Dept: [phone] and follow the prompts.   For any non-urgent questions, you may also contact your provider using MyChart. We now offer e-Visits for anyone 61 and older to request care online for non-urgent symptoms. For details visit [email or web address].   Also download the MyChart app! Go to the app store, search "MyChart", open the app, select [facility], and log in with your MyChart username and password.  Masks are optional in the cancer centers. If you would like for your care team to wear a mask while they are taking care of you, please let them know. You may have one support person who is at least 73 years old accompany you for your appointments.

## 2022-06-12 NOTE — Progress Notes (Signed)
[facility] [phone]   Fax:([phone]) [phone]  OFFICE PROGRESS NOTE  [name], MD [place] [address]  DIAGNOSIS: Stage IV non-small cell lung cancer (T3, N0, M1C) adenocarcinoma.  The patient presented with a and solitary brain metastasis.  The patient was diagnosed in February 2022.  Biomarker Findings Microsatellite status - MS-Stable Tumor Mutational Burden - 8 Muts/Mb Genomic Findings For a complete list of the genes assayed, please refer to the Appendix. KRAS G12V KEAP1 S224F TP53 P151T 7 Disease relevant genes with no reportable alterations: ALK, BRAF, EGFR, ERBB2, MET, RET, ROS1  PDL1 Expression: 50%    PRIOR THERAPY: 1) SRS to the metastatic brain lesion on 08/23/2020 under the care of Dr. Tammi Klippel and craniotomy under the care of Dr. Zada Finders scheduled for 08/24/2020. 2) S/p robotic assisted right upper lobectomy with en bloc wedge resection of the right middle lobe and lymph node dissection under the care of Dr. Roxan Hockey on September 24, 2020. 3) status post SRS to a new subcentimeter brain lesions under the care of Dr. Lisbeth Renshaw. 4) SRS to brain metastasis.   CURRENT THERAPY: Systemic chemotherapy with carboplatin for AUC of 5, Alimta 500 Mg/M2 and Keytruda 200 mg IV every 3 weeks.  First dose Nov 07, 2020.  Status post 26 cycles.  Starting from cycle #5 the patient will be on maintenance treatment with Alimta and Keytruda every 3 weeks.  INTERVAL HISTORY: Hannah [name] 71 y.o. female returns to the clinic today for follow-up visit accompanied by a family member.  Her daughter was also available by phone during the visit.  The patient is feeling fine today with no concerning complaints.  She denied having any fatigue or weakness.  She has no nausea, vomiting, diarrhea or constipation.  She has no headache or visual changes.  She denied having any recent weight loss or night sweats.  They have some difficulty accessing  her Port-A-Cath for the lab earlier today.  She is here today for evaluation before starting cycle #27.   MEDICAL HISTORY: Past Medical History:  Diagnosis Date   Allergy    Anemia    Anxiety    Arthritis    Carpal tunnel syndrome    Constipation    senna C stool softeners help    Depression    Diverticulosis    Dyslipidemia    External hemorrhoids    GERD (gastroesophageal reflux disease)    Heart murmur    mild-moderate AR   Hiatal hernia    Hyperlipidemia    on meds    Hypertension    Internal hemorrhoids    lung ca with brain mets 07/2020   Osteoarthritis    Pre-diabetes    PVD (peripheral vascular disease) (HCC)    moderate carotid disease   RBBB    Smoker    Vocal cord polyps     ALLERGIES:  is allergic to amlodipine, chantix [varenicline tartrate], clarithromycin, lisinopril, simvastatin, wellbutrin [bupropion hcl], lipitor [atorvastatin], and sertraline.  MEDICATIONS:  Current Outpatient Medications  Medication Sig Dispense Refill   acetaminophen (TYLENOL) 325 MG tablet Take 325 mg by mouth every 6 (six) hours as needed for moderate pain.     ALPRAZolam (XANAX XR) 0.5 MG 24 hr tablet Take 1 tablet (0.5 mg total) by mouth daily. 30 tablet 5   ALPRAZolam (XANAX) 0.5 MG tablet TAKE 1 TABLET BY MOUTH THREE TIMES DAILY AS NEEDED FOR ANXIETY 90 tablet 0   aspirin  81 MG tablet Take 1 tablet (81 mg total) by mouth daily. Restart on 08/31/20 30 tablet    bisacodyl (DULCOLAX) 5 MG EC tablet Take 5 mg by mouth daily as needed for moderate constipation.     carboxymethylcellulose (REFRESH PLUS) 0.5 % SOLN Place 1-2 drops into both eyes 3 (three) times daily as needed (dry eyes).     Cholecalciferol (D3 PO) Take 1 capsule by mouth daily.     citalopram (CELEXA) 10 MG tablet Take 1 tablet by mouth once daily 90 tablet 0   dexamethasone (DECADRON) 1 MG tablet Take 1 tablet (1 mg total) by mouth daily with breakfast. 30 tablet 1   fluticasone (FLONASE) 50 MCG/ACT nasal spray  PLACE 2 SPRAYS INTO BOTH NOSTRILS DAILY. (Patient taking differently: Place 2 sprays into both nostrils daily as needed for allergies.) 48 g 1   folic acid (FOLVITE) 1 MG tablet Take 1 tablet by mouth once daily 30 tablet 0   Lacosamide 100 MG TABS Take 1 tablet (100 mg total) by mouth 2 (two) times daily. 60 tablet 3   levETIRAcetam (KEPPRA) 100 MG/ML solution TAKE 15 ML BY MOUTH  TWICE DAILY 473 mL 0   lidocaine-prilocaine (EMLA) cream Apply 1 Application topically as needed. 30 g 2   loratadine (CLARITIN) 10 MG tablet Take 10 mg by mouth daily as needed for allergies.     magnesium hydroxide (MILK OF MAGNESIA) 800 MG/5ML suspension Take 30 mLs by mouth daily as needed for constipation.     Menthol, Topical Analgesic, (BIOFREEZE EX) Apply 1 application topically as needed (pain).     mirtazapine (REMERON) 15 MG tablet Take 0.5 tablets (7.5 mg total) by mouth at bedtime. 90 tablet 2   olopatadine (PATANOL) 0.1 % ophthalmic solution Place 1 drop into both eyes 2 (two) times daily as needed for allergies.     omeprazole (PRILOSEC) 40 MG capsule TAKE 1 CAPSULE TWICE DAILY BEFORE MEALS (Patient taking differently: Take 40 mg by mouth daily.) 180 capsule 0   polyethylene glycol (MIRALAX / GLYCOLAX) 17 g packet Take 17 g by mouth 2 (two) times daily. 72 each 0   pravastatin (PRAVACHOL) 40 MG tablet Take 1 tablet (40 mg total) by mouth every evening. 90 tablet 3   Probiotic Product (PROBIOTIC DAILY PO) Take 1 capsule by mouth daily.     prochlorperazine (COMPAZINE) 10 MG tablet TAKE 1 TABLET BY MOUTH EVERY 6 HOURS AS NEEDED FOR NAUSEA OR VOMITING 30 tablet 0   simethicone (MYLICON) 80 MG chewable tablet Chew 1 tablet (80 mg total) by mouth 4 (four) times daily as needed for flatulence (Bloating). 30 tablet 0   No current facility-administered medications for this visit.    SURGICAL HISTORY:  Past Surgical History:  Procedure Laterality Date   ABDOMINAL HYSTERECTOMY  7035   APPLICATION OF CRANIAL  NAVIGATION N/A 08/24/2020   Procedure: APPLICATION OF CRANIAL NAVIGATION;  Surgeon: Judith Part, MD;  Location: [facility];  Service: Neurosurgery;  Laterality: N/A;   COLONOSCOPY     COLONOSCOPY W/ POLYPECTOMY  2009   CRANIOTOMY Left 08/24/2020   Procedure: Left Craniotomy for Tumor Resection with Brainlab;  Surgeon: Judith Part, MD;  Location: [facility];  Service: Neurosurgery;  Laterality: Left;   HEMORRHOID SURGERY     INTERCOSTAL NERVE BLOCK Right 09/24/2020   Procedure: INTERCOSTAL NERVE BLOCK;  Surgeon: Melrose Nakayama, MD;  Location[facility] OR;  Service: Thoracic;  Laterality: Right;   IR IMAGING GUIDED PORT INSERTION  05/15/2022   JOINT REPLACEMENT     LYMPH NODE DISSECTION Right 09/24/2020   Procedure: LYMPH NODE DISSECTION;  Surgeon: Melrose Nakayama, MD;  Location: [facility] OR;  Service: Thoracic;  Laterality: Right;   POLYPECTOMY     right total knee arthroplasty     Dr. Emeterio Reeve 06-04-18   SPINE SURGERY  08/16/2009   TOTAL KNEE ARTHROPLASTY Left 02/13/2014   Procedure: TOTAL KNEE ARTHROPLASTY;  Surgeon: Alta Corning, MD;  Location: [facility];  Service: Orthopedics;  Laterality: Left;   TOTAL KNEE ARTHROPLASTY Right 06/04/2018   Procedure: RIGHT TOTAL KNEE ARTHROPLASTY;  Surgeon: Dorna Leitz, MD;  Location: [facility];  Service: Orthopedics;  Laterality: Right;  Adductor Block   TUBAL LIGATION      REVIEW OF SYSTEMS:  A comprehensive review of systems was negative except for: Constitutional: positive for fatigue   PHYSICAL EXAMINATION: General appearance: alert, cooperative, fatigued, and no distress Head: Normocephalic, without obvious abnormality, atraumatic Neck: no adenopathy, no JVD, supple, symmetrical, trachea midline, and thyroid not enlarged, symmetric, no tenderness/mass/nodules Lymph nodes: Cervical, supraclavicular, and axillary nodes normal. Resp: clear to auscultation bilaterally Back: symmetric, no curvature. ROM normal. No CVA tenderness. Cardio: regular rate and  rhythm, S1, S2 normal, no murmur, click, rub or gallop GI: soft, non-tender; bowel sounds normal; no masses,  no organomegaly Extremities: extremities normal, atraumatic, no cyanosis or edema  ECOG PERFORMANCE STATUS: 1 - Symptomatic but completely ambulatory  Blood pressure 129/79, pulse 79, temperature 98 F (36.7 C), temperature source Oral, resp. rate 16, height 5' (1.524 m), weight 127 lb 12.8 oz (58 kg), SpO2 100 %.  LABORATORY DATA: Lab Results  Component Value Date   WBC 4.0 05/28/2022   HGB 9.5 (L) 05/28/2022   HCT 29.7 (L) 05/28/2022   MCV 90.3 05/28/2022   PLT 179 05/28/2022      Chemistry      Component Value Date/Time   NA 138 05/28/2022 1521   K 3.8 05/28/2022 1521   CL 104 05/28/2022 1521   CO2 26 05/28/2022 1521   BUN 11 05/28/2022 1521   CREATININE 0.73 05/28/2022 1521   CREATININE 0.79 03/14/2020 1016      Component Value Date/Time   CALCIUM 9.3 05/28/2022 1521   ALKPHOS 63 05/28/2022 1521   AST 31 05/28/2022 1521   ALT 17 05/28/2022 1521   BILITOT <0.1 (L) 05/28/2022 1521       RADIOGRAPHIC STUDIES: DG Chest 2 View  Result Date: 05/28/2022 CLINICAL DATA:  Chest pain EXAM: CHEST - 2 VIEW COMPARISON:  04/29/2022, 10/27/2021 FINDINGS: Interval placement of right IJ approach chest port with distal tip terminating at the superior cavoatrial junction. Normal heart size. Postsurgical changes to the right lung with volume loss and right basilar scarring. No focal airspace consolidation, pleural effusion, or pneumothorax. IMPRESSION: No active cardiopulmonary disease. Electronically Signed   By: Davina Poke D.O.   On: 05/28/2022 17:34   IR IMAGING GUIDED PORT INSERTION  Result Date: 05/15/2022 INDICATION: Lung cancer. In need of durable intravenous access for chemotherapy administration. EXAM: IMPLANTED PORT A CATH PLACEMENT WITH ULTRASOUND AND FLUOROSCOPIC GUIDANCE COMPARISON:  Chest CT-04/29/2022 MEDICATIONS: None ANESTHESIA/SEDATION: Moderate  (conscious) sedation was employed during this procedure as administered by the Interventional Radiology RN. A total of Versed 1.5 mg and Fentanyl 75 mcg was administered intravenously. Moderate Sedation Time: 25 minutes. The patient's level of consciousness and vital signs were monitored continuously by radiology nursing throughout the procedure under my direct supervision. CONTRAST:  None FLUOROSCOPY  TIME:  30 seconds (2 mGy) COMPLICATIONS: None immediate. PROCEDURE: The procedure, risks, benefits, and alternatives were explained to the patient. Questions regarding the procedure were encouraged and answered. The patient understands and consents to the procedure. The right neck and chest were prepped with chlorhexidine in a sterile fashion, and a sterile drape was applied covering the operative field. Maximum barrier sterile technique with sterile gowns and gloves were used for the procedure. A timeout was performed prior to the initiation of the procedure. Local anesthesia was provided with 1% lidocaine with epinephrine. After creating a small venotomy incision, a micropuncture kit was utilized to access the internal jugular vein. Real-time ultrasound guidance was utilized for vascular access including the acquisition of a permanent ultrasound image documenting patency of the accessed vessel. The microwire was utilized to measure appropriate catheter length. A subcutaneous port pocket was then created along the upper chest wall utilizing a combination of sharp and blunt dissection. The pocket was irrigated with sterile saline. A single lumen Slim sized power injectable port was chosen for placement. The 8 Fr catheter was tunneled from the port pocket site to the venotomy incision. The port was placed in the pocket. The external catheter was trimmed to appropriate length. At the venotomy, an 8 Fr peel-away sheath was placed over a guidewire under fluoroscopic guidance. The catheter was then placed through the sheath  and the sheath was removed. Final catheter positioning was confirmed and documented with a fluoroscopic spot radiograph. The port was accessed with a Huber needle, aspirated and flushed with heparinized saline. The venotomy site was closed with an interrupted 4-0 Vicryl suture. The port pocket incision was closed with interrupted 2-0 Vicryl suture. Dermabond and Steri-strips were applied to both incisions. Dressings were applied. The patient tolerated the procedure well without immediate post procedural complication. FINDINGS: After catheter placement, the tip lies within the superior cavoatrial junction. The catheter aspirates and flushes normally and is ready for immediate use. IMPRESSION: Successful placement of a right internal jugular approach power injectable Port-A-Cath. The catheter is ready for immediate use. Electronically Signed   By: Sandi Mariscal M.D.   On: 05/15/2022 15:46     ASSESSMENT AND PLAN: This is a very pleasant 71 years old African-American female diagnosed with stage IV (T3, N0, M1b) non-small cell lung cancer, adenocarcinoma presented with right upper lobe lung mass with solitary brain metastasis diagnosed in February 2022 status post SRS to the brain lesion followed by craniotomy and resection. The patient also has a solitary lung mass. S/p robotic assisted right upper lobectomy with en bloc wedge resection of the right middle lobe and lymph node dissection under the care of Dr. Roxan Hockey on September 24, 2020. She also underwent SRS treatment to a new subcentimeter brain lesions under the care of Dr. Lisbeth Renshaw She is currently undergoing systemic chemotherapy with carboplatin for AUC of 5, Alimta 500 Mg/M2 and Keytruda 200 Mg IV every 3 weeks status post 26 cycles.  Starting from cycle #5 the patient will be on maintenance treatment with Alimta and Keytruda every 3 weeks. The patient continues to tolerate her treatment with maintenance Alimta and Keytruda fairly well. I recommended for  her to proceed with cycle #27 today as planned. I will see her back for follow-up visit in 3 weeks for evaluation before starting cycle #28. For the brain metastasis, she has new 3 mm focus of enhancement in the right temporal lobe that need to be monitored closely and she is followed by neurooncology and radiation  oncology. The patient was advised to call immediately if she has any other concerning symptoms in the interval. The patient voices understanding of current disease status and treatment options and is in agreement with the current care plan.  All questions were answered. The patient knows to call the clinic with any problems, questions or concerns. We can certainly see the patient much sooner if necessary.  Disclaimer: This note was dictated with voice recognition software. Similar sounding words can inadvertently be transcribed and may not be corrected upon review.

## 2022-06-14 LAB — T4: T4, Total: 9 ug/dL (ref 4.5–12.0)

## 2022-06-26 ENCOUNTER — Encounter: Payer: Self-pay | Admitting: Internal Medicine

## 2022-06-27 ENCOUNTER — Encounter: Payer: Self-pay | Admitting: Internal Medicine

## 2022-06-27 ENCOUNTER — Ambulatory Visit: Payer: Medicare HMO | Admitting: Internal Medicine

## 2022-06-27 ENCOUNTER — Other Ambulatory Visit: Payer: Self-pay | Admitting: Internal Medicine

## 2022-06-27 ENCOUNTER — Ambulatory Visit (INDEPENDENT_AMBULATORY_CARE_PROVIDER_SITE_OTHER): Payer: Medicare HMO

## 2022-06-27 VITALS — BP 128/70 | HR 91 | Temp 98.9°F | Ht 60.0 in | Wt 127.0 lb

## 2022-06-27 DIAGNOSIS — M25472 Effusion, left ankle: Secondary | ICD-10-CM | POA: Insufficient documentation

## 2022-06-27 DIAGNOSIS — M25572 Pain in left ankle and joints of left foot: Secondary | ICD-10-CM

## 2022-06-27 DIAGNOSIS — C3411 Malignant neoplasm of upper lobe, right bronchus or lung: Secondary | ICD-10-CM

## 2022-06-27 DIAGNOSIS — R21 Rash and other nonspecific skin eruption: Secondary | ICD-10-CM | POA: Insufficient documentation

## 2022-06-27 DIAGNOSIS — M7662 Achilles tendinitis, left leg: Secondary | ICD-10-CM

## 2022-06-27 DIAGNOSIS — M7660 Achilles tendinitis, unspecified leg: Secondary | ICD-10-CM | POA: Insufficient documentation

## 2022-06-27 MED ORDER — KETOCONAZOLE 2 % EX CREA: 1.0000 | TOPICAL_CREAM | Freq: Every day | CUTANEOUS | 1 refills | Status: DC

## 2022-06-27 MED ORDER — PREDNISONE 10 MG PO TABS: ORAL_TABLET | ORAL | 0 refills | Status: DC

## 2022-06-27 NOTE — Assessment & Plan Note (Signed)
Very dark nontender atrumatic c/w likely more chronic distal LLE achilles difficulty than pt able to recall today, no evidence of trauma or bruising, d/w daughter this is c/w rather large area of likely chronic post inflammatory  hyperpigmention, not likely to improve with any tx but should fade with time

## 2022-06-27 NOTE — Assessment & Plan Note (Signed)
Unclear if an effusion c/w djd or gout, or more superficial related to achilles tendonitis; for prednisone taper, pain control, for xray, and f/u sport med if not improving

## 2022-06-27 NOTE — Assessment & Plan Note (Signed)
Mild to mod but activity limiting given her age, for sport med self referral if not improving 1-2 wks

## 2022-06-27 NOTE — Progress Notes (Signed)
Patient ID: Hannah [name], female   DOB: 03-15-1951, 71 y.o.   MRN: [id number]        Chief Complaint: follow up left ankle and posterior distal leg pain with dark skin noted       HPI:  Hannah [name] is a 71 y.o. female here with daughter with c/o ? 1 -2 wks onset left ankle pain and swelling, as well as posterior distal leg pain and tenderness; pt appears to have ongoing cognitive difficulty or memory dysfunction and daughter gives most of hx, and pt appears uncertain about recent hx; No falls or trauma, but also has rather dark discoloration mostly to the lateral achiles insertion area, lateral heel and tarsal tunnell area lateral foot, much milder noted to medial aspect. Pt denies chest pain, increased sob or doe, wheezing, orthopnea, PND, increased LE swelling, palpitations, dizziness or syncope.   Pt denies polydipsia, polyuria, or new focal neuro s/s.  No recent trauma, fever, and no hx of gout.        Wt Readings from Last 3 Encounters:  06/27/22 127 lb (57.6 kg)  06/12/22 127 lb 12.8 oz (58 kg)  05/28/22 132 lb 3.2 oz (60 kg)   BP Readings from Last 3 Encounters:  06/27/22 128/70  06/12/22 (!) 144/69  06/12/22 129/79         Past Medical History:  Diagnosis Date   Allergy    Anemia    Anxiety    Arthritis    Carpal tunnel syndrome    Constipation    senna C stool softeners help    Depression    Diverticulosis    Dyslipidemia    External hemorrhoids    GERD (gastroesophageal reflux disease)    Heart murmur    mild-moderate AR   Hiatal hernia    Hyperlipidemia    on meds    Hypertension    Internal hemorrhoids    lung ca with brain mets 07/2020   Osteoarthritis    Pre-diabetes    PVD (peripheral vascular disease) (HCC)    moderate carotid disease   RBBB    Smoker    Vocal cord polyps    Past Surgical History:  Procedure Laterality Date   ABDOMINAL HYSTERECTOMY  2297   APPLICATION OF CRANIAL NAVIGATION N/A 08/24/2020   Procedure: APPLICATION OF CRANIAL  NAVIGATION;  Surgeon: Judith Part, MD;  Location: [facility];  Service: Neurosurgery;  Laterality: N/A;   COLONOSCOPY     COLONOSCOPY W/ POLYPECTOMY  2009   CRANIOTOMY Left 08/24/2020   Procedure: Left Craniotomy for Tumor Resection with Brainlab;  Surgeon: Judith Part, MD;  Location: [facility];  Service: Neurosurgery;  Laterality: Left;   HEMORRHOID SURGERY     INTERCOSTAL NERVE BLOCK Right 09/24/2020   Procedure: INTERCOSTAL NERVE BLOCK;  Surgeon: Melrose Nakayama, MD;  Location: [facility];  Service: Thoracic;  Laterality: Right;   IR IMAGING GUIDED PORT INSERTION  05/15/2022   JOINT REPLACEMENT     LYMPH NODE DISSECTION Right 09/24/2020   Procedure: LYMPH NODE DISSECTION;  Surgeon: Melrose Nakayama, MD;  Location: [facility];  Service: Thoracic;  Laterality: Right;   POLYPECTOMY     right total knee arthroplasty     Dr. Emeterio Reeve 06-04-18   [place]  08/16/2009   TOTAL KNEE ARTHROPLASTY Left 02/13/2014   Procedure: TOTAL KNEE ARTHROPLASTY;  Surgeon: Alta Corning, MD;  Location: [facility];  Service: Orthopedics;  Laterality: Left;   TOTAL KNEE ARTHROPLASTY Right 06/04/2018  Procedure: RIGHT TOTAL KNEE ARTHROPLASTY;  Surgeon: Dorna Leitz, MD;  Location: [facility];  Service: Orthopedics;  Laterality: Right;  Adductor Block   TUBAL LIGATION      reports that she quit smoking about 1 years ago. Her smoking use included cigarettes. She has a 10.00 pack-year smoking history. She has never been exposed to tobacco smoke. She has never used smokeless tobacco. She reports that she does not drink alcohol and does not use drugs. family history includes Breast cancer in her maternal aunt; Cancer in her father and sister; Colitis in her maternal aunt; Diabetes in her sister; Prostate cancer in her father; Stroke in her maternal grandfather. Allergies  Allergen Reactions   Amlodipine Swelling and Rash    Rash, swelling   Chantix [Varenicline Tartrate] Shortness Of Breath, Swelling and Other (See  Comments)    Tongue swell,sob   Clarithromycin Rash   Lisinopril Hives   Simvastatin Hives   Wellbutrin [Bupropion Hcl] Hives   Lipitor [Atorvastatin]     Dizziness per patient   Sertraline     Makes her feel like she is going to kill someone   Current Outpatient Medications on File Prior to Visit  Medication Sig Dispense Refill   acetaminophen (TYLENOL) 325 MG tablet Take 325 mg by mouth every 6 (six) hours as needed for moderate pain.     ALPRAZolam (XANAX XR) 0.5 MG 24 hr tablet Take 1 tablet (0.5 mg total) by mouth daily. 30 tablet 5   ALPRAZolam (XANAX) 0.5 MG tablet TAKE 1 TABLET BY MOUTH THREE TIMES DAILY AS NEEDED FOR ANXIETY 90 tablet 0   aspirin 81 MG tablet Take 1 tablet (81 mg total) by mouth daily. Restart on 08/31/20 30 tablet    bisacodyl (DULCOLAX) 5 MG EC tablet Take 5 mg by mouth daily as needed for moderate constipation.     carboxymethylcellulose (REFRESH PLUS) 0.5 % SOLN Place 1-2 drops into both eyes 3 (three) times daily as needed (dry eyes).     Cholecalciferol (D3 PO) Take 1 capsule by mouth daily.     citalopram (CELEXA) 10 MG tablet Take 1 tablet by mouth once daily 90 tablet 0   dexamethasone (DECADRON) 1 MG tablet Take 1 tablet (1 mg total) by mouth daily with breakfast. 30 tablet 1   fluticasone (FLONASE) 50 MCG/ACT nasal spray PLACE 2 SPRAYS INTO BOTH NOSTRILS DAILY. (Patient taking differently: Place 2 sprays into both nostrils daily as needed for allergies.) 48 g 1   Lacosamide 100 MG TABS Take 1 tablet (100 mg total) by mouth 2 (two) times daily. 60 tablet 3   lidocaine-prilocaine (EMLA) cream Apply 1 Application topically as needed. 30 g 2   loratadine (CLARITIN) 10 MG tablet Take 10 mg by mouth daily as needed for allergies.     magnesium hydroxide (MILK OF MAGNESIA) 800 MG/5ML suspension Take 30 mLs by mouth daily as needed for constipation.     Menthol, Topical Analgesic, (BIOFREEZE EX) Apply 1 application topically as needed (pain).     mirtazapine  (REMERON) 15 MG tablet Take 0.5 tablets (7.5 mg total) by mouth at bedtime. 90 tablet 2   olopatadine (PATANOL) 0.1 % ophthalmic solution Place 1 drop into both eyes 2 (two) times daily as needed for allergies.     omeprazole (PRILOSEC) 40 MG capsule TAKE 1 CAPSULE TWICE DAILY BEFORE MEALS (Patient taking differently: Take 40 mg by mouth daily.) 180 capsule 0   polyethylene glycol (MIRALAX / GLYCOLAX) 17 g packet Take 17  g by mouth 2 (two) times daily. 72 each 0   pravastatin (PRAVACHOL) 40 MG tablet TAKE 1 TABLET EVERY EVENING 90 tablet 3   Probiotic Product (PROBIOTIC DAILY PO) Take 1 capsule by mouth daily.     prochlorperazine (COMPAZINE) 10 MG tablet TAKE 1 TABLET BY MOUTH EVERY 6 HOURS AS NEEDED FOR NAUSEA OR VOMITING 30 tablet 0   simethicone (MYLICON) 80 MG chewable tablet Chew 1 tablet (80 mg total) by mouth 4 (four) times daily as needed for flatulence (Bloating). 30 tablet 0   No current facility-administered medications on file prior to visit.        ROS:  All others reviewed and negative.  Objective        PE:  BP 128/70 (BP Location: Right Arm, Patient Position: Sitting, Cuff Size: Large)   Pulse 91   Temp 98.9 F (37.2 C) (Oral)   Ht 5' (1.524 m)   Wt 127 lb (57.6 kg)   SpO2 94%   BMI 24.80 kg/m                 Constitutional: Pt appears in NAD               HENT: Head: NCAT.                Right Ear: External ear normal.                 Left Ear: External ear normal.                Eyes: . Pupils are equal, round, and reactive to light. Conjunctivae and EOM are normal               Nose: without d/c or deformity               Neck: Neck supple. Gross normal ROM               Cardiovascular: Normal rate and regular rhythm.                 Pulmonary/Chest: Effort normal and breath sounds without rales or wheezing.                Left calf nontender, + tender achilles worst at the heelleft ankle with possible mild effusion vs more superficial swelling, and severe but  nontender likely post inflammatory hyperpigmentation noted to achilles insertion area, lateral heel and tarsal tunnel area, with very mild noted to the medial aspect heel               Neurological: Pt is alert. At baseline orientation, motor grossly intact               Skin: Skin is warm. No rashes, no other new lesions, LE edema - none               Psychiatric: Pt behavior is normal without agitation   Micro: none  Cardiac tracings I have personally interpreted today:  none  Pertinent Radiological findings (summarize): none   Lab Results  Component Value Date   WBC 4.1 06/12/2022   HGB 11.2 (L) 06/12/2022   HCT 35.2 (L) 06/12/2022   PLT 319 06/12/2022   GLUCOSE 113 (H) 06/12/2022   CHOL 185 03/21/2022   TRIG 212.0 (H) 03/21/2022   HDL 52.30 03/21/2022   LDLDIRECT 89.0 03/21/2022   LDLCALC 62 03/14/2020   ALT 10 06/12/2022   AST 22 06/12/2022   NA 143  06/12/2022   K 4.2 06/12/2022   CL 106 06/12/2022   CREATININE 0.70 06/12/2022   BUN 12 06/12/2022   CO2 32 06/12/2022   TSH 0.653 06/12/2022   INR 1.0 02/12/2021   HGBA1C 6.4 03/21/2022   MICROALBUR <0.7 03/21/2022   Assessment/Plan:  CHENIKA NEVILS is a 71 y.o. Black or African American [2] female with  has a past medical history of Allergy, Anemia, Anxiety, Arthritis, Carpal tunnel syndrome, Constipation, Depression, Diverticulosis, Dyslipidemia, External hemorrhoids, GERD (gastroesophageal reflux disease), Heart murmur, Hiatal hernia, Hyperlipidemia, Hypertension, Internal hemorrhoids, lung ca with brain mets (07/2020), Osteoarthritis, Pre-diabetes, PVD (peripheral vascular disease) ([place]), RBBB, Smoker, and Vocal cord polyps.  Achilles tendonitis Mild to mod but activity limiting given her age, for sport med self referral if not improving 1-2 wks  Pain and swelling of left ankle Unclear if an effusion c/w djd or gout, or more superficial related to achilles tendonitis; for prednisone taper, pain control, for xray, and  f/u sport med if not improving  Rash Very dark nontender atrumatic c/w likely more chronic distal LLE achilles difficulty than pt able to recall today, no evidence of trauma or bruising, d/w daughter this is c/w rather large area of likely chronic post inflammatory  hyperpigmention, not likely to improve with any tx but should fade with time  Followup: Return if symptoms worsen or fail to improve.  Hannah Cower, MD 06/27/2022 8:35 PM [facility]

## 2022-06-27 NOTE — Progress Notes (Signed)
Outside notes received. Information abstracted. Notes sent to scan.  

## 2022-06-27 NOTE — Patient Instructions (Signed)
Please take all new medication as prescribed - the prednisone, and the cream  Please continue all other medications as before, and refills have been done if requested.  Please have the pharmacy call with any other refills you may need.  Please keep your appointments with your specialists as you may have planned  Please go to the XRAY Department in the first floor for the x-ray testing  You will be contacted by phone if any changes need to be made immediately.  Otherwise, you will receive a letter about your results with an explanation, but please check with MyChart first.  Please remember to sign up for MyChart if you have not done so, as this will be important to you in the future with finding out test results, communicating by private email, and scheduling acute appointments online when needed.

## 2022-06-30 ENCOUNTER — Other Ambulatory Visit: Payer: Self-pay | Admitting: Internal Medicine

## 2022-06-30 DIAGNOSIS — M25472 Effusion, left ankle: Secondary | ICD-10-CM

## 2022-06-30 DIAGNOSIS — M7662 Achilles tendinitis, left leg: Secondary | ICD-10-CM

## 2022-07-01 NOTE — Progress Notes (Unsigned)
Hannah [name] OFFICE PROGRESS NOTE  Hannah Rail, [place] [id number]  DIAGNOSIS: Stage IV non-small cell lung cancer (T3, N0, M1C) adenocarcinoma.  The patient presented with a right upper lobe lung mass and solitary brain metastasis.  The patient was diagnosed in February 2022.   Biomarker Findings Microsatellite status - MS-Stable Tumor Mutational Burden - 8 Muts/Mb Genomic Findings For a complete list of the genes assayed, please refer to the Appendix. KRAS G12V KEAP1 S224F TP53 P151T 7 Disease relevant genes with no reportable alterations: ALK, BRAF, EGFR, ERBB2, MET, RET, ROS1   PDL1 Expression: 90%  PRIOR THERAPY: 1) SRS to the metastatic brain lesion on 08/23/2020 under the care of Dr. Tammi Klippel and craniotomy under the care of Dr. Zada Finders on 08/24/2020. 2) S/p robotic assisted right upper lobectomy with en bloc wedge resection of the right middle lobe and lymph node dissection under the care of Dr. Roxan Hockey on September 24, 2020 3) SRS to the two new subcentimeter metastases under the care of Dr. Tammi Klippel on 11/29/20.  4) 10/14/21: L frontoparietal progression, undergoes LITT at [facility]) 5) SRS to brain metastasis, last dose on 11/18/21  CURRENT THERAPY: Systemic chemotherapy with carboplatin for AUC of 5, Alimta 500 Mg/M2 and Keytruda 200 mg IV every 3 weeks.  First dose Nov 08, 2020. Status post 27 cycles.  Starting from cycle #5, the patient started maintenance Alimta and Keytruda.    INTERVAL HISTORY: Hannah [name] 72 y.o. female returns to the clinic today for a follow-up visit accompanied by her daughter. The patient is feeling well today without any concerning complaints.  In the interval since last being seen the patient saw her PCP for ankle swelling.  The patient was started on a prednisone taper and had an x-ray and is being considered for referral to sports medicine for possible achilles tendonitis. She has not taken any  prednisone today. She is supposed to take 10 mg daily for the next 3 days before completing her taper. In early December 2023, they called the clinic for possible subtle change in word finding. Therefore, Dr. Renda Rolls team prescribed decadron. They never started this because her symptoms improved.   From exam today, there is no swelling. The area of concern is some skin darkening near her posterior foot/heel. No tenderness or traumas. No heat or erythema. This could be skin darkening/pigmentation which can occur secondary to Alimta. They are noticing some similar skin darkening on the right foot at well.   Otherwise, she is tolerating her treatment with Keytruda and Alimta well without any concerning adverse side effects except fatigue and she rests a lot; however, she has been more active. Denies any fever or chills.  He denies any recent night sweats. Denies any shortness of breath. Denies cough. Denies any chest discomfort. Denies hemoptysis. Denies any nausea, vomiting, or diarrhea. She has constipation at baseline. She sees neuro-oncology here with Dr. Mickeal Skinner. She also is seen at [facility].  She has a follow up brain MRI scheduled for 07/25/21.  She is here today for evaluation and repeat blood work before starting cycle #28.   Few pounds, more sleeping drainged.   MEDICAL HISTORY: Past Medical History:  Diagnosis Date   Allergy    Anemia    Anxiety    Arthritis    Carpal tunnel syndrome    Constipation    senna C stool softeners help    Depression    Diverticulosis    Dyslipidemia  External hemorrhoids    GERD (gastroesophageal reflux disease)    Heart murmur    mild-moderate AR   Hiatal hernia    Hyperlipidemia    on meds    Hypertension    Internal hemorrhoids    lung ca with brain mets 07/2020   Osteoarthritis    Pre-diabetes    PVD (peripheral vascular disease) (HCC)    moderate carotid disease   RBBB    Smoker    Vocal cord polyps     ALLERGIES:  is allergic to  amlodipine, chantix [varenicline tartrate], clarithromycin, lisinopril, simvastatin, wellbutrin [bupropion hcl], lipitor [atorvastatin], and sertraline.  MEDICATIONS:  Current Outpatient Medications  Medication Sig Dispense Refill   acetaminophen (TYLENOL) 325 MG tablet Take 325 mg by mouth every 6 (six) hours as needed for moderate pain.     ALPRAZolam (XANAX XR) 0.5 MG 24 hr tablet Take 1 tablet (0.5 mg total) by mouth daily. 30 tablet 5   ALPRAZolam (XANAX) 0.5 MG tablet TAKE 1 TABLET BY MOUTH THREE TIMES DAILY AS NEEDED FOR ANXIETY 90 tablet 0   aspirin 81 MG tablet Take 1 tablet (81 mg total) by mouth daily. Restart on 08/31/20 30 tablet    bisacodyl (DULCOLAX) 5 MG EC tablet Take 5 mg by mouth daily as needed for moderate constipation.     carboxymethylcellulose (REFRESH PLUS) 0.5 % SOLN Place 1-2 drops into both eyes 3 (three) times daily as needed (dry eyes).     Cholecalciferol (D3 PO) Take 1 capsule by mouth daily.     citalopram (CELEXA) 10 MG tablet Take 1 tablet by mouth once daily 90 tablet 0   dexamethasone (DECADRON) 1 MG tablet Take 1 tablet (1 mg total) by mouth daily with breakfast. 30 tablet 1   fluticasone (FLONASE) 50 MCG/ACT nasal spray PLACE 2 SPRAYS INTO BOTH NOSTRILS DAILY. (Patient taking differently: Place 2 sprays into both nostrils daily as needed for allergies.) 48 g 1   folic acid (FOLVITE) 1 MG tablet Take 1 tablet by mouth once daily 30 tablet 0   ketoconazole (NIZORAL) 2 % cream Apply 1 Application topically daily. 30 g 1   Lacosamide 100 MG TABS Take 1 tablet (100 mg total) by mouth 2 (two) times daily. 60 tablet 3   levETIRAcetam (KEPPRA) 100 MG/ML solution TAKE 15 ML BY MOUTH  TWICE DAILY 473 mL 0   lidocaine-prilocaine (EMLA) cream Apply 1 Application topically as needed. 30 g 2   loratadine (CLARITIN) 10 MG tablet Take 10 mg by mouth daily as needed for allergies.     magnesium hydroxide (MILK OF MAGNESIA) 800 MG/5ML suspension Take 30 mLs by mouth daily as  needed for constipation.     Menthol, Topical Analgesic, (BIOFREEZE EX) Apply 1 application topically as needed (pain).     mirtazapine (REMERON) 15 MG tablet Take 0.5 tablets (7.5 mg total) by mouth at bedtime. 90 tablet 2   olopatadine (PATANOL) 0.1 % ophthalmic solution Place 1 drop into both eyes 2 (two) times daily as needed for allergies.     omeprazole (PRILOSEC) 40 MG capsule TAKE 1 CAPSULE TWICE DAILY BEFORE MEALS (Patient taking differently: Take 40 mg by mouth daily.) 180 capsule 0   polyethylene glycol (MIRALAX / GLYCOLAX) 17 g packet Take 17 g by mouth 2 (two) times daily. 72 each 0   pravastatin (PRAVACHOL) 40 MG tablet TAKE 1 TABLET EVERY EVENING 90 tablet 3   predniSONE (DELTASONE) 10 MG tablet 3 tabs by mouth per day for  3 days,2tabs per day for 3 days,1tab per day for 3 days 18 tablet 0   Probiotic Product (PROBIOTIC DAILY PO) Take 1 capsule by mouth daily.     prochlorperazine (COMPAZINE) 10 MG tablet TAKE 1 TABLET BY MOUTH EVERY 6 HOURS AS NEEDED FOR NAUSEA OR VOMITING 30 tablet 0   simethicone (MYLICON) 80 MG chewable tablet Chew 1 tablet (80 mg total) by mouth 4 (four) times daily as needed for flatulence (Bloating). 30 tablet 0   No current facility-administered medications for this visit.    SURGICAL HISTORY:  Past Surgical History:  Procedure Laterality Date   ABDOMINAL HYSTERECTOMY  3716   APPLICATION OF CRANIAL NAVIGATION N/A 08/24/2020   Procedure: APPLICATION OF CRANIAL NAVIGATION;  Surgeon: Judith Part, MD;  Location: [place];  Service: Neurosurgery;  Laterality: N/A;   COLONOSCOPY     COLONOSCOPY W/ POLYPECTOMY  2009   CRANIOTOMY Left 08/24/2020   Procedure: Left Craniotomy for Tumor Resection with Brainlab;  Surgeon: Judith Part, MD;  Location: [place];  Service: Neurosurgery;  Laterality: Left;   HEMORRHOID SURGERY     INTERCOSTAL NERVE BLOCK Right 09/24/2020   Procedure: INTERCOSTAL NERVE BLOCK;  Surgeon: Melrose Nakayama, MD;  Location: [place];  Service: Thoracic;  Laterality: Right;   IR IMAGING GUIDED PORT INSERTION  05/15/2022   JOINT REPLACEMENT     LYMPH NODE DISSECTION Right 09/24/2020   Procedure: LYMPH NODE DISSECTION;  Surgeon: Melrose Nakayama, MD;  Location: [place];  Service: Thoracic;  Laterality: Right;   POLYPECTOMY     right total knee arthroplasty     Dr. Emeterio Reeve 06-04-18   [place]  08/16/2009   TOTAL KNEE ARTHROPLASTY Left 02/13/2014   Procedure: TOTAL KNEE ARTHROPLASTY;  Surgeon: Alta Corning, MD;  Location: [place];  Service: Orthopedics;  Laterality: Left;   TOTAL KNEE ARTHROPLASTY Right 06/04/2018   Procedure: RIGHT TOTAL KNEE ARTHROPLASTY;  Surgeon: Dorna Leitz, MD;  Location: [facility];  Service: Orthopedics;  Laterality: Right;  Adductor Block   TUBAL LIGATION      REVIEW OF SYSTEMS:   Review of Systems  Constitutional: Positive for fatigue. Negative for appetite change, chills, fever and unexpected weight change.  HENT: Negative for mouth sores, nosebleeds, sore throat and trouble swallowing.   Eyes: Negative for eye problems and icterus.  Respiratory: Negative for cough, hemoptysis, shortness of breath and wheezing.   Cardiovascular: Negative for chest pain and leg swelling.  Gastrointestinal: Negative for abdominal pain, constipation, diarrhea, nausea and vomiting.  Genitourinary: Negative for bladder incontinence, difficulty urinating, dysuria, frequency and hematuria.   Musculoskeletal: Negative for back pain, gait problem, neck pain and neck stiffness.  Skin: Positive for skin darkening. Negative for rash and pruritis.  Neurological: Negative for dizziness, extremity weakness, gait problem, headaches, light-headedness and seizures.  Hematological: Negative for adenopathy. Does not bruise/bleed easily.  Psychiatric/Behavioral: Negative for confusion, depression and sleep disturbance. The patient is not nervous/anxious   PHYSICAL EXAMINATION:  Blood pressure (!) 143/72, pulse 70,  temperature 97.9 F (36.6 C), temperature source Oral, resp. rate 16, height 5' (1.524 m), weight 124 lb 1.6 oz (56.3 kg), SpO2 100 %.  ECOG PERFORMANCE STATUS: 1  Physical Exam  Constitutional: Oriented to person, place, and time and thin appearing female and in no distress. HENT: Head: Normocephalic and atraumatic. Mouth/Throat: Oropharynx is clear and moist. No oropharyngeal exudate. No thrush.  Eyes: Conjunctivae are normal. Right eye exhibits no discharge. Left eye exhibits no discharge. No  scleral icterus. Neck: Normal range of motion. Neck supple. Cardiovascular: Normal rate, regular rhythm, systolic murmur noted and intact distal pulses.   Abdominal: Soft. Bowel sounds are normal. Exhibits no distension and no mass. There is no tenderness.  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 No weakness in hands. No decreased sensation. No swelling or erythema. Normal range of motion. Exhibits no edema.  Lymphadenopathy:    No cervical adenopathy.  Neurological: Alert and oriented to person, place, and time. Exhibits normal muscle tone. Gait normal. Coordination normal.  Skin: Skin is warm and dry. Positive for skin darkening around heel. No swelling, tenderness, or erythema. Not diaphoretic. No erythema. No pallor.  Psychiatric: Mood, memory and judgment normal.  Vitals reviewed.  LABORATORY DATA: Lab Results  Component Value Date   WBC 6.7 07/03/2022   HGB 10.9 (L) 07/03/2022   HCT 34.6 (L) 07/03/2022   MCV 89.6 07/03/2022   PLT 372 07/03/2022      Chemistry      Component Value Date/Time   NA 143 06/12/2022 0933   K 4.2 06/12/2022 0933   CL 106 06/12/2022 0933   CO2 32 06/12/2022 0933   BUN 12 06/12/2022 0933   CREATININE 0.70 06/12/2022 0933   CREATININE 0.79 03/14/2020 1016      Component Value Date/Time   CALCIUM 10.0 06/12/2022 0933   ALKPHOS 81 06/12/2022 0933   AST 22 06/12/2022 0933   ALT 10 06/12/2022 0933   BILITOT 0.2 (L) 06/12/2022 0933        RADIOGRAPHIC STUDIES:  DG Ankle Complete Left  Result Date: 06/29/2022 CLINICAL DATA:  Greater than week left ankle pain. Tender Achilles. Swelling and skin discoloration of heel. EXAM: LEFT ANKLE COMPLETE - 3+ VIEW COMPARISON:  None Available. FINDINGS: Mildly decreased bone mineralization. Minimal distal medial malleolar degenerative spurring. The ankle mortise is symmetric and intact. No acute fracture or dislocation. Small plantar calcaneal heel spur. Mild dorsal talonavicular degenerative spurring. IMPRESSION: 1. Small plantar calcaneal heel spur. 2. Minimal medial malleolar degenerative spurring. Electronically Signed   By: Yvonne Kendall M.D.   On: 06/29/2022 14:45     ASSESSMENT/PLAN:  This is a very pleasant 72 year old African-American female diagnosed with stage IV (T3, N0, M1 B) non-small cell lung cancer, adenocarcinoma.  She presented with a right upper lobe lung mass with a solitary brain metastasis.  She was diagnosed in February 2022.  Her PD-L1 expression is 90%.  She does not have any actionable mutations.   The patient completed SRS followed by craniotomy and resection on 08/23/2020 under the care of Dr. Tammi Klippel and craniotomy under the care of Dr. Zada Finders on 08/24/2020.  She then had a robot-assisted right upper lobectomy with en bloc wedge resection of the right middle lobe and lymph node dissection under the care of Dr. Roxan Hockey on September 24, 2020.   She underwent SRS to the two new subcentimeter brain metastases on 11/29/20.   The patient is currently undergoing systemic chemotherapy with carboplatin for an AUC of 5, Alimta 500 mg per metered squared, Keytruda 200 mg IV every 3 weeks.  She is status post 27 cycles and tolerated it well without any adverse side effects.  Starting from cycle #5, the patient started maintenance Alimta and Keytruda.   She recently had progessive metastatic disease to the brain and is status post SRS which was completed on 11/18/21.  She  also had the LITT brain procedure Dr. Brett Albino at [facility] in April 2023.   Labs were reviewed.  Recommend that she proceed cycle #28 today as scheduled.   We will see her back for follow-up visit in 3 weeks for evaluation and repeat blood work before undergoing cycle #29.  I will arrange for restaging CT scan prior to her next cycle of treatment.  She will continue to follow closely with radiation oncology neurooncology for her history of metastatic disease to the brain. She has an upcoming brain MRI on 1/26.   The skin darkening may be a side effect of alimta. Reviewed with the patient and her daughter.   She had been taking prednisone the last few days. Discussed we generally don't want patient's to exceed 10 mg with [place] unless absolutely needed. She did not take this today and was supposed to take this for 3 more days. Would recommend that she stop this early if possible.   The patient was advised to call immediately if she has any concerning symptoms in the interval. The patient voices understanding of current disease status and treatment options and is in agreement with the current care plan. All questions were answered. The patient knows to call the clinic with any problems, questions or concerns. We can certainly see the patient much sooner if necessary       Orders Placed This Encounter  Procedures   CT Chest W Contrast    Please schedule scan on 1/22    Standing Status:   Future    Standing Expiration Date:   07/03/2023    Scheduling Instructions:     Please schedule scan on 1/22    Order Specific Question:   If indicated for the ordered procedure, I authorize the administration of contrast media per Radiology protocol    Answer:   Yes    Order Specific Question:   Does the patient have a contrast media/X-ray dye allergy?    Answer:   No    Order Specific Question:   Preferred imaging location?    Answer:   [facility]   CT Abdomen Pelvis W Contrast    Standing  Status:   Future    Standing Expiration Date:   07/03/2023    Order Specific Question:   If indicated for the ordered procedure, I authorize the administration of contrast media per Radiology protocol    Answer:   Yes    Order Specific Question:   Does the patient have a contrast media/X-ray dye allergy?    Answer:   No    Order Specific Question:   Preferred imaging location?    Answer:   [facility]    Order Specific Question:   Is Oral Contrast requested for this exam?    Answer:   Yes, Per Radiology protocol      The total time spent in the appointment was 20-29 minutes.   Cassandra L Heilingoetter, PA-C 07/03/22

## 2022-07-03 ENCOUNTER — Other Ambulatory Visit: Payer: Medicare HMO

## 2022-07-03 ENCOUNTER — Inpatient Hospital Stay: Payer: Medicare HMO

## 2022-07-03 ENCOUNTER — Other Ambulatory Visit: Payer: Self-pay

## 2022-07-03 ENCOUNTER — Telehealth: Payer: Self-pay | Admitting: Internal Medicine

## 2022-07-03 ENCOUNTER — Inpatient Hospital Stay: Payer: Medicare HMO | Attending: Internal Medicine | Admitting: Physician Assistant

## 2022-07-03 VITALS — BP 143/72 | HR 70 | Temp 97.9°F | Resp 16 | Ht 60.0 in | Wt 124.1 lb

## 2022-07-03 DIAGNOSIS — Z5111 Encounter for antineoplastic chemotherapy: Secondary | ICD-10-CM

## 2022-07-03 DIAGNOSIS — Z87891 Personal history of nicotine dependence: Secondary | ICD-10-CM | POA: Diagnosis not present

## 2022-07-03 DIAGNOSIS — F32A Depression, unspecified: Secondary | ICD-10-CM | POA: Insufficient documentation

## 2022-07-03 DIAGNOSIS — M25473 Effusion, unspecified ankle: Secondary | ICD-10-CM | POA: Diagnosis not present

## 2022-07-03 DIAGNOSIS — E785 Hyperlipidemia, unspecified: Secondary | ICD-10-CM | POA: Diagnosis not present

## 2022-07-03 DIAGNOSIS — K5730 Diverticulosis of large intestine without perforation or abscess without bleeding: Secondary | ICD-10-CM | POA: Insufficient documentation

## 2022-07-03 DIAGNOSIS — R5383 Other fatigue: Secondary | ICD-10-CM | POA: Diagnosis not present

## 2022-07-03 DIAGNOSIS — K760 Fatty (change of) liver, not elsewhere classified: Secondary | ICD-10-CM | POA: Insufficient documentation

## 2022-07-03 DIAGNOSIS — M8580 Other specified disorders of bone density and structure, unspecified site: Secondary | ICD-10-CM | POA: Insufficient documentation

## 2022-07-03 DIAGNOSIS — M25572 Pain in left ankle and joints of left foot: Secondary | ICD-10-CM | POA: Diagnosis not present

## 2022-07-03 DIAGNOSIS — Z888 Allergy status to other drugs, medicaments and biological substances status: Secondary | ICD-10-CM | POA: Diagnosis not present

## 2022-07-03 DIAGNOSIS — Z803 Family history of malignant neoplasm of breast: Secondary | ICD-10-CM | POA: Insufficient documentation

## 2022-07-03 DIAGNOSIS — Z8719 Personal history of other diseases of the digestive system: Secondary | ICD-10-CM | POA: Diagnosis not present

## 2022-07-03 DIAGNOSIS — Z90710 Acquired absence of both cervix and uterus: Secondary | ICD-10-CM | POA: Diagnosis not present

## 2022-07-03 DIAGNOSIS — Z5112 Encounter for antineoplastic immunotherapy: Secondary | ICD-10-CM

## 2022-07-03 DIAGNOSIS — M4317 Spondylolisthesis, lumbosacral region: Secondary | ICD-10-CM | POA: Insufficient documentation

## 2022-07-03 DIAGNOSIS — C3411 Malignant neoplasm of upper lobe, right bronchus or lung: Secondary | ICD-10-CM

## 2022-07-03 DIAGNOSIS — Z7952 Long term (current) use of systemic steroids: Secondary | ICD-10-CM | POA: Insufficient documentation

## 2022-07-03 DIAGNOSIS — Z823 Family history of stroke: Secondary | ICD-10-CM | POA: Insufficient documentation

## 2022-07-03 DIAGNOSIS — Z8379 Family history of other diseases of the digestive system: Secondary | ICD-10-CM | POA: Insufficient documentation

## 2022-07-03 DIAGNOSIS — M4316 Spondylolisthesis, lumbar region: Secondary | ICD-10-CM | POA: Diagnosis not present

## 2022-07-03 DIAGNOSIS — J479 Bronchiectasis, uncomplicated: Secondary | ICD-10-CM | POA: Insufficient documentation

## 2022-07-03 DIAGNOSIS — C7931 Secondary malignant neoplasm of brain: Secondary | ICD-10-CM | POA: Insufficient documentation

## 2022-07-03 DIAGNOSIS — Z8042 Family history of malignant neoplasm of prostate: Secondary | ICD-10-CM | POA: Insufficient documentation

## 2022-07-03 DIAGNOSIS — F419 Anxiety disorder, unspecified: Secondary | ICD-10-CM | POA: Diagnosis not present

## 2022-07-03 DIAGNOSIS — K5900 Constipation, unspecified: Secondary | ICD-10-CM | POA: Insufficient documentation

## 2022-07-03 DIAGNOSIS — R4189 Other symptoms and signs involving cognitive functions and awareness: Secondary | ICD-10-CM | POA: Diagnosis not present

## 2022-07-03 DIAGNOSIS — Z95828 Presence of other vascular implants and grafts: Secondary | ICD-10-CM

## 2022-07-03 DIAGNOSIS — Z79899 Other long term (current) drug therapy: Secondary | ICD-10-CM | POA: Insufficient documentation

## 2022-07-03 DIAGNOSIS — Z809 Family history of malignant neoplasm, unspecified: Secondary | ICD-10-CM | POA: Insufficient documentation

## 2022-07-03 DIAGNOSIS — Z833 Family history of diabetes mellitus: Secondary | ICD-10-CM | POA: Insufficient documentation

## 2022-07-03 LAB — CBC WITH DIFFERENTIAL (CANCER CENTER ONLY)
Abs Immature Granulocytes: 0.03 10*3/uL (ref 0.00–0.07)
Basophils Absolute: 0 10*3/uL (ref 0.0–0.1)
Basophils Relative: 1 %
Eosinophils Absolute: 0 10*3/uL (ref 0.0–0.5)
Eosinophils Relative: 0 %
HCT: 34.6 % — ABNORMAL LOW (ref 36.0–46.0)
Hemoglobin: 10.9 g/dL — ABNORMAL LOW (ref 12.0–15.0)
Immature Granulocytes: 1 %
Lymphocytes Relative: 37 %
Lymphs Abs: 2.5 10*3/uL (ref 0.7–4.0)
MCH: 28.2 pg (ref 26.0–34.0)
MCHC: 31.5 g/dL (ref 30.0–36.0)
MCV: 89.6 fL (ref 80.0–100.0)
Monocytes Absolute: 0.8 10*3/uL (ref 0.1–1.0)
Monocytes Relative: 11 %
Neutro Abs: 3.3 10*3/uL (ref 1.7–7.7)
Neutrophils Relative %: 50 %
Platelet Count: 372 10*3/uL (ref 150–400)
RBC: 3.86 MIL/uL — ABNORMAL LOW (ref 3.87–5.11)
RDW: 12.8 % (ref 11.5–15.5)
WBC Count: 6.7 10*3/uL (ref 4.0–10.5)
nRBC: 0 % (ref 0.0–0.2)

## 2022-07-03 LAB — CMP (CANCER CENTER ONLY)
ALT: 18 U/L (ref 0–44)
AST: 23 U/L (ref 15–41)
Albumin: 3.7 g/dL (ref 3.5–5.0)
Alkaline Phosphatase: 79 U/L (ref 38–126)
Anion gap: 7 (ref 5–15)
BUN: 18 mg/dL (ref 8–23)
CO2: 30 mmol/L (ref 22–32)
Calcium: 9.7 mg/dL (ref 8.9–10.3)
Chloride: 103 mmol/L (ref 98–111)
Creatinine: 0.78 mg/dL (ref 0.44–1.00)
GFR, Estimated: >60 mL/min (ref >60)
Glucose, Bld: 87 mg/dL (ref 70–99)
Potassium: 3.5 mmol/L (ref 3.5–5.1)
Sodium: 140 mmol/L (ref 135–145)
Total Bilirubin: 0.2 mg/dL — ABNORMAL LOW (ref 0.3–1.2)
Total Protein: 6.4 g/dL — ABNORMAL LOW (ref 6.5–8.1)

## 2022-07-03 MED ORDER — SODIUM CHLORIDE 0.9% FLUSH
10.0000 mL | INTRAVENOUS | Status: DC | PRN
  Administered 2022-07-03: 10 mL

## 2022-07-03 MED ORDER — SODIUM CHLORIDE 0.9 % IV SOLN
500.0000 mg/m2 | Freq: Once | INTRAVENOUS | Status: AC
  Administered 2022-07-03: 800 mg via INTRAVENOUS
  Filled 2022-07-03: qty 20

## 2022-07-03 MED ORDER — SODIUM CHLORIDE 0.9% FLUSH
10.0000 mL | INTRAVENOUS | Status: AC | PRN
  Administered 2022-07-03: 10 mL

## 2022-07-03 MED ORDER — SODIUM CHLORIDE 0.9 % IV SOLN
200.0000 mg | Freq: Once | INTRAVENOUS | Status: AC
  Administered 2022-07-03: 200 mg via INTRAVENOUS
  Filled 2022-07-03: qty 200

## 2022-07-03 MED ORDER — PROCHLORPERAZINE MALEATE 10 MG PO TABS
10.0000 mg | ORAL_TABLET | Freq: Once | ORAL | Status: AC
  Administered 2022-07-03: 10 mg via ORAL
  Filled 2022-07-03: qty 1

## 2022-07-03 MED ORDER — HEPARIN SOD (PORK) LOCK FLUSH 100 UNIT/ML IV SOLN
500.0000 [IU] | Freq: Once | INTRAVENOUS | Status: AC | PRN
  Administered 2022-07-03: 500 [IU]

## 2022-07-03 MED ORDER — SODIUM CHLORIDE 0.9 % IV SOLN: Freq: Once | INTRAVENOUS | Status: AC

## 2022-07-03 NOTE — Telephone Encounter (Signed)
Called patient regarding upcoming appointments, patient is notified. 

## 2022-07-03 NOTE — Patient Instructions (Signed)
[facility] ONCOLOGY  Discharge Instructions: Thank you for choosing [facility] to provide your oncology and hematology care.   If you have a lab appointment with the [facility], please go directly to the [facility] and check in at the registration area.   Wear comfortable clothing and clothing appropriate for easy access to any Portacath or PICC line.   We strive to give you quality time with your provider. You may need to reschedule your appointment if you arrive late (15 or more minutes).  Arriving late affects you and other patients whose appointments are after yours.  Also, if you miss three or more appointments without notifying the office, you may be dismissed from the clinic at the provider's discretion.      For prescription refill requests, have your pharmacy contact our office and allow 72 hours for refills to be completed.    Today you received the following chemotherapy and/or immunotherapy agents: Keytruda/Alimta      To help prevent nausea and vomiting after your treatment, we encourage you to take your nausea medication as directed.  BELOW ARE SYMPTOMS THAT SHOULD BE REPORTED IMMEDIATELY: *FEVER GREATER THAN 100.4 F (38 C) OR HIGHER *CHILLS OR SWEATING *NAUSEA AND VOMITING THAT IS NOT CONTROLLED WITH YOUR NAUSEA MEDICATION *UNUSUAL SHORTNESS OF BREATH *UNUSUAL BRUISING OR BLEEDING *URINARY PROBLEMS (pain or burning when urinating, or frequent urination) *BOWEL PROBLEMS (unusual diarrhea, constipation, pain near the anus) TENDERNESS IN MOUTH AND THROAT WITH OR WITHOUT PRESENCE OF ULCERS (sore throat, sores in mouth, or a toothache) UNUSUAL RASH, SWELLING OR PAIN  UNUSUAL VAGINAL DISCHARGE OR ITCHING   Items with * indicate a potential emergency and should be followed up as soon as possible or go to the Emergency Department if any problems should occur.  Please show the CHEMOTHERAPY ALERT CARD or IMMUNOTHERAPY ALERT CARD at  check-in to the Emergency Department and triage nurse.  Should you have questions after your visit or need to cancel or reschedule your appointment, please contact [facility]  Dept: [phone]  and follow the prompts.  Office hours are 8:00 a.m. to 4:30 p.m. Monday - Friday. Please note that voicemails left after 4:00 p.m. may not be returned until the following business day.  We are closed weekends and major holidays. You have access to a nurse at all times for urgent questions. Please call the main number to the clinic Dept: [phone] and follow the prompts.   For any non-urgent questions, you may also contact your provider using MyChart. We now offer e-Visits for anyone 14 and older to request care online for non-urgent symptoms. For details visit [email or web address].   Also download the MyChart app! Go to the app store, search "MyChart", open the app, select [facility], and log in with your MyChart username and password.  Masks are optional in the cancer centers. If you would like for your care team to wear a mask while they are taking care of you, please let them know. You may have one support Falicity Sheets who is at least 74 years old accompany you for your appointments.

## 2022-07-05 ENCOUNTER — Other Ambulatory Visit: Payer: Self-pay | Admitting: Internal Medicine

## 2022-07-10 ENCOUNTER — Ambulatory Visit: Payer: Medicare HMO | Admitting: Family Medicine

## 2022-07-10 ENCOUNTER — Ambulatory Visit: Payer: Self-pay

## 2022-07-10 VITALS — BP 128/76 | HR 100 | Ht 60.0 in | Wt 123.0 lb

## 2022-07-10 DIAGNOSIS — M25511 Pain in right shoulder: Secondary | ICD-10-CM | POA: Diagnosis not present

## 2022-07-10 DIAGNOSIS — G8929 Other chronic pain: Secondary | ICD-10-CM

## 2022-07-10 DIAGNOSIS — M25512 Pain in left shoulder: Secondary | ICD-10-CM | POA: Diagnosis not present

## 2022-07-10 NOTE — Patient Instructions (Addendum)
Thank you for coming in today.   You received an injection today. Seek immediate medical attention if the joint becomes red, extremely painful, or is oozing fluid.   Check back as needed

## 2022-07-10 NOTE — Progress Notes (Signed)
I, Peterson Lombard, LAT, ATC acting as a scribe for Lynne Leader, MD.  Hannah [name] is a 72 y.o. female who presents to [place] [facility] today for cont'd chronic bilat arm pain.  She has a hx of lung cancer that metatastisized to her brain. Pt was last seen by Dr. Georgina Snell on 12/25/21 and was given bilat subacromial steroid injections. Pt had Clifford steroid injections bilaterally on 12/11/21, w/o much benefit. Today, pt reports pain is in the mid-upper arm bilat. Pt had pain w/ shoulder aBd and IR.  Dx imaging: 12/11/21 R & L shoulder XR   Pertinent review of systems: No fevers or chills  Relevant historical information: Metastatic lung cancer.   Exam:  BP 128/76   Pulse 100   Ht 5' (1.524 m)   Wt 123 lb (55.8 kg)   SpO2 99%   BMI 24.02 kg/m  General: Well Developed, well nourished, and in no acute distress.   MSK: Shoulders bilaterally decreased muscle bulk.  Decreased range of motion pain with abduction.  Strength generally intact.    Lab and Radiology Results  Procedure: Real-time Ultrasound Guided Injection of right shoulder subacromial bursa Device: Philips Affiniti 50G Images permanently stored and available for review in PACS Verbal informed consent obtained.  Discussed risks and benefits of procedure. Warned about infection, bleeding, hyperglycemia damage to structures among others. Patient expresses understanding and agreement Time-out conducted.   Noted no overlying erythema, induration, or other signs of local infection.   Skin prepped in a sterile fashion.   Local anesthesia: Topical Ethyl chloride.   With sterile technique and under real time ultrasound guidance: 40 mg of Kenalog and 2 mL of Marcaine injected into subacromial bursa. Fluid seen entering the bursa.   Completed without difficulty   Pain immediately resolved suggesting accurate placement of the medication.   Advised to call if fevers/chills, erythema, induration, drainage, or  persistent bleeding.   Images permanently stored and available for review in the ultrasound unit.  Impression: Technically successful ultrasound guided injection.    Procedure: Real-time Ultrasound Guided Injection of left shoulder subacromial bursa Device: Philips Affiniti 50G Images permanently stored and available for review in PACS Verbal informed consent obtained.  Discussed risks and benefits of procedure. Warned about infection, bleeding, hyperglycemia damage to structures among others. Patient expresses understanding and agreement Time-out conducted.   Noted no overlying erythema, induration, or other signs of local infection.   Skin prepped in a sterile fashion.   Local anesthesia: Topical Ethyl chloride.   With sterile technique and under real time ultrasound guidance: 40 mg of Kenalog and 2 mL of Marcaine injected into subacromial bursa. Fluid seen entering the bursa.   Completed without difficulty   Pain immediately resolved suggesting accurate placement of the medication.   Advised to call if fevers/chills, erythema, induration, drainage, or persistent bleeding.   Images permanently stored and available for review in the ultrasound unit.  Impression: Technically successful ultrasound guided injection.         Assessment and Plan: 72 y.o. female with chronic bilateral shoulder pain.  This is an acute exacerbation of a chronic issue.  She was last seen for this in June 2023 and did well following subacromial injections.  This worked until recently and the pain has returned.  Plan for repeat injection today bilaterally.  Ideally I would add physical therapy to this.  However she has metastatic lung cancer currently doing pretty well with treatment.  However I would  like to minimize her healthcare burden.  If she is able to get 6 months of benefit with subacromial injections I think that is pretty good.  If not we may want to add PT.   PDMP not reviewed this  encounter. Orders Placed This Encounter  Procedures   [place] LIMITED JOINT SPACE STRUCTURES UP BILAT(NO LINKED CHARGES)    Order Specific Question:   Reason for Exam (SYMPTOM  OR DIAGNOSIS REQUIRED)    Answer:   bilateral shoulder pain    Order Specific Question:   Preferred imaging location?    Answer:   [place]   No orders of the defined types were placed in this encounter.    Discussed warning signs or symptoms. Please see discharge instructions. Patient expresses understanding.   The above documentation has been reviewed and is accurate and complete Lynne Leader, M.D.

## 2022-07-11 ENCOUNTER — Telehealth: Payer: Self-pay

## 2022-07-11 NOTE — Telephone Encounter (Signed)
T/C from pt's daughter stating Hannah [name] has been sleeping more than usual and has been having difficulty with speaking.  She received another Covid vaccine recently. She had steroid injections in her left shoulder yesterday and she started her back on Decadron 1 mg today.  She asked to be seen in [facility] today but they are not seeing patients today.  Please advise

## 2022-07-11 NOTE — Telephone Encounter (Signed)
Pt's daughter advised with VU and agreed to this plan.

## 2022-07-16 ENCOUNTER — Other Ambulatory Visit: Payer: Self-pay | Admitting: Internal Medicine

## 2022-07-17 ENCOUNTER — Other Ambulatory Visit: Payer: Medicare HMO

## 2022-07-21 ENCOUNTER — Ambulatory Visit: Payer: Medicare HMO | Admitting: Internal Medicine

## 2022-07-21 ENCOUNTER — Telehealth: Payer: Self-pay | Admitting: Medical Oncology

## 2022-07-21 NOTE — Telephone Encounter (Signed)
Can they get portaacath orders to access, flush w saline then heparin and deaccess for her scan. I told her it was ok to use the smart set for portacath maintenance.

## 2022-07-22 ENCOUNTER — Ambulatory Visit
Admission: RE | Admit: 2022-07-22 | Discharge: 2022-07-22 | Disposition: A | Discharge status: Home / Self Care | Payer: Medicare HMO | Source: Ambulatory Visit | Attending: Physician Assistant | Admitting: Physician Assistant

## 2022-07-22 DIAGNOSIS — I700 Atherosclerosis of aorta: Secondary | ICD-10-CM | POA: Diagnosis not present

## 2022-07-22 DIAGNOSIS — K5730 Diverticulosis of large intestine without perforation or abscess without bleeding: Secondary | ICD-10-CM | POA: Diagnosis not present

## 2022-07-22 DIAGNOSIS — J929 Pleural plaque without asbestos: Secondary | ICD-10-CM | POA: Diagnosis not present

## 2022-07-22 DIAGNOSIS — C3411 Malignant neoplasm of upper lobe, right bronchus or lung: Secondary | ICD-10-CM

## 2022-07-22 DIAGNOSIS — K3189 Other diseases of stomach and duodenum: Secondary | ICD-10-CM | POA: Diagnosis not present

## 2022-07-22 DIAGNOSIS — K760 Fatty (change of) liver, not elsewhere classified: Secondary | ICD-10-CM | POA: Diagnosis not present

## 2022-07-22 DIAGNOSIS — K8689 Other specified diseases of pancreas: Secondary | ICD-10-CM | POA: Diagnosis not present

## 2022-07-22 DIAGNOSIS — J479 Bronchiectasis, uncomplicated: Secondary | ICD-10-CM | POA: Diagnosis not present

## 2022-07-22 DIAGNOSIS — C3490 Malignant neoplasm of unspecified part of unspecified bronchus or lung: Secondary | ICD-10-CM | POA: Diagnosis not present

## 2022-07-22 MED ORDER — HEPARIN SOD (PORK) LOCK FLUSH 100 UNIT/ML IV SOLN
500.0000 [IU] | Freq: Once | INTRAVENOUS | Status: AC
  Administered 2022-07-22: 500 [IU] via INTRAVENOUS

## 2022-07-22 MED ORDER — SODIUM CHLORIDE 0.9% FLUSH
10.0000 mL | INTRAVENOUS | Status: DC | PRN
  Administered 2022-07-22: 10 mL via INTRAVENOUS

## 2022-07-22 MED ORDER — IOPAMIDOL (ISOVUE-300) INJECTION 61%
100.0000 mL | Freq: Once | INTRAVENOUS | Status: AC | PRN
  Administered 2022-07-22: 100 mL via INTRAVENOUS

## 2022-07-22 NOTE — Progress Notes (Signed)
[facility] OFFICE PROGRESS NOTE  Pincus Sanes, MD [address] [place] [address]  DIAGNOSIS: Stage IV non-small cell lung cancer (T3, N0, M1C) adenocarcinoma.  The patient presented with a right upper lobe lung mass and solitary brain metastasis.  The patient was diagnosed in February 2022.   Biomarker Findings Microsatellite status - MS-Stable Tumor Mutational Burden - 8 Muts/Mb Genomic Findings For a complete list of the genes assayed, please refer to the Appendix. KRAS G12V KEAP1 S224F TP53 P151T 7 Disease relevant genes with no reportable alterations: ALK, BRAF, EGFR, ERBB2, MET, RET, ROS1   PDL1 Expression: 90%  PRIOR THERAPY: 1) SRS to the metastatic brain lesion on 08/23/2020 under the care of Dr. [name] and craniotomy under the care of Dr. Maurice Small on 08/24/2020. 2) S/p robotic assisted right upper lobectomy with en bloc wedge resection of the right middle lobe and lymph node dissection under the care of Dr. Dorris Fetch on September 24, 2020 3) SRS to the two new subcentimeter metastases under the care of Dr. [name] on 11/29/20.  4) 10/14/21: L frontoparietal progression, undergoes L[facility] at [facility]) 5) SRS to brain metastasis, last dose on 11/18/21  CURRENT THERAPY: Systemic chemotherapy with carboplatin for AUC of 5, Alimta 500 Mg/M2 and Keytruda 200 mg IV every 3 weeks.  First dose Nov 08, 2020. Status post 28 cycles.  Starting from cycle #5, the patient started maintenance Alimta and Keytruda.     INTERVAL HISTORY: Hannah [name] 72 y.o. female returns to the clinic today for a follow-up visit accompanied by her daughter.  The patient was last seen by myself 3 weeks ago.  The patient is feeling well without any concerning complaints.  Back on 05/30/2022, the patient called reporting speech alterations and Dr. Barbaraann Cao recommended the patient start taking 1 mg of Decadron due to her history of metastatic disease to the brain.  When I had seen the patient  07/03/2022, she had not started taking Decadron since her symptoms have improved.  However, since she was last seen, she did call the interval reporting recurrent speech changes for which she did finally restart Decadron.  Speech changes have resolved at that time. She also had a little bit of a headache that resolved.   The patient is currently undergoing treatment with Alimta and Keytruda.  She has some skin darkening on her lower extremity/heel which may be secondary to her Alimta. Otherwise, she is tolerating her treatment with Keytruda and Alimta well without any concerning adverse side effects except fatigue. Denies any fever or chills.  He denies any recent night sweats. Denies any shortness of breath. Denies cough. Denies any chest discomfort. Denies hemoptysis. Denies any nausea, vomiting, or diarrhea. She has constipation at baseline. She sees neuro-oncology here with Dr. Barbaraann Cao. She also is seen at [facility].  She has a follow up brain MRI scheduled for 07/25/21 and follow up with neuro oncology next week.  The patient recently had a restaging CT scan performed.  She is here today for evaluation and repeat blood work before starting cycle #29.   MEDICAL HISTORY: Past Medical History:  Diagnosis Date   Allergy    Anemia    Anxiety    Arthritis    Carpal tunnel syndrome    Constipation    senna C stool softeners help    Depression    Diverticulosis    Dyslipidemia    External hemorrhoids    GERD (gastroesophageal reflux disease)    Heart  murmur    mild-moderate AR   Hiatal hernia    Hyperlipidemia    on meds    Hypertension    Internal hemorrhoids    lung ca with brain mets 07/2020   Osteoarthritis    Pre-diabetes    PVD (peripheral vascular disease) (HCC)    moderate carotid disease   RBBB    Smoker    Vocal cord polyps     ALLERGIES:  is allergic to amlodipine, chantix [varenicline tartrate], clarithromycin, lisinopril, simvastatin, wellbutrin [bupropion hcl], lipitor  [atorvastatin], and sertraline.  MEDICATIONS:  Current Outpatient Medications  Medication Sig Dispense Refill   acetaminophen (TYLENOL) 325 MG tablet Take 325 mg by mouth every 6 (six) hours as needed for moderate pain.     ALPRAZolam (XANAX XR) 0.5 MG 24 hr tablet Take 1 tablet by mouth once daily 30 tablet 0   ALPRAZolam (XANAX) 0.5 MG tablet TAKE 1 TABLET BY MOUTH THREE TIMES DAILY AS NEEDED FOR ANXIETY 90 tablet 0   aspirin 81 MG tablet Take 1 tablet (81 mg total) by mouth daily. Restart on 08/31/20 30 tablet    bisacodyl (DULCOLAX) 5 MG EC tablet Take 5 mg by mouth daily as needed for moderate constipation.     carboxymethylcellulose (REFRESH PLUS) 0.5 % SOLN Place 1-2 drops into both eyes 3 (three) times daily as needed (dry eyes).     Cholecalciferol (D3 PO) Take 1 capsule by mouth daily.     citalopram (CELEXA) 10 MG tablet Take 1 tablet by mouth once daily 90 tablet 0   dexamethasone (DECADRON) 1 MG tablet Take 1 tablet (1 mg total) by mouth daily with breakfast. 30 tablet 1   fluticasone (FLONASE) 50 MCG/ACT nasal spray PLACE 2 SPRAYS INTO BOTH NOSTRILS DAILY. (Patient taking differently: Place 2 sprays into both nostrils daily as needed for allergies.) 48 g 1   folic acid (FOLVITE) 1 MG tablet Take 1 tablet by mouth once daily 30 tablet 0   ketoconazole (NIZORAL) 2 % cream Apply 1 Application topically daily. 30 g 1   Lacosamide 100 MG TABS Take 1 tablet (100 mg total) by mouth 2 (two) times daily. 60 tablet 3   levETIRAcetam (KEPPRA) 100 MG/ML solution TAKE 15 ML BY MOUTH  TWICE DAILY 473 mL 0   lidocaine-prilocaine (EMLA) cream Apply 1 Application topically as needed. 30 g 2   loratadine (CLARITIN) 10 MG tablet Take 10 mg by mouth daily as needed for allergies.     magnesium hydroxide (MILK OF MAGNESIA) 800 MG/5ML suspension Take 30 mLs by mouth daily as needed for constipation.     Menthol, Topical Analgesic, (BIOFREEZE EX) Apply 1 application topically as needed (pain).      mirtazapine (REMERON) 30 MG tablet Take 30 mg by mouth at bedtime.     olopatadine (PATANOL) 0.1 % ophthalmic solution Place 1 drop into both eyes 2 (two) times daily as needed for allergies.     omeprazole (PRILOSEC) 40 MG capsule TAKE 1 CAPSULE TWICE DAILY BEFORE MEALS (Patient taking differently: Take 40 mg by mouth daily.) 180 capsule 0   polyethylene glycol (MIRALAX / GLYCOLAX) 17 g packet Take 17 g by mouth 2 (two) times daily. 72 each 0   pravastatin (PRAVACHOL) 40 MG tablet TAKE 1 TABLET EVERY EVENING 90 tablet 3   Probiotic Product (PROBIOTIC DAILY PO) Take 1 capsule by mouth daily.     prochlorperazine (COMPAZINE) 10 MG tablet TAKE 1 TABLET BY MOUTH EVERY 6 HOURS AS NEEDED FOR  NAUSEA OR VOMITING 30 tablet 0   simethicone (MYLICON) 80 MG chewable tablet Chew 1 tablet (80 mg total) by mouth 4 (four) times daily as needed for flatulence (Bloating). 30 tablet 0   No current facility-administered medications for this visit.   Facility-Administered Medications Ordered in Other Visits  Medication Dose Route Frequency Provider Last Rate Last Admin   heparin lock flush 100 unit/mL  500 Units Intracatheter Once PRN Si Gaul, MD       pembrolizumab [facility]) 200 mg in sodium chloride 0.9 % 50 mL chemo infusion  200 mg Intravenous Once Si Gaul, MD       PEMEtrexed (ALIMTA) 800 mg in sodium chloride 0.9 % 100 mL chemo infusion  500 mg/m2 (Treatment Plan Recorded) Intravenous Once Si Gaul, MD       prochlorperazine (COMPAZINE) tablet 10 mg  10 mg Oral Once Si Gaul, MD       sodium chloride flush (NS) 0.9 % injection 10 mL  10 mL Intracatheter PRN Si Gaul, MD        SURGICAL HISTORY:  Past Surgical History:  Procedure Laterality Date   ABDOMINAL HYSTERECTOMY  1994   APPLICATION OF CRANIAL NAVIGATION N/A 08/24/2020   Procedure: APPLICATION OF CRANIAL NAVIGATION;  Surgeon: Jadene Pierini, MD;  Location: [facility] OR;  Service: Neurosurgery;  Laterality:  N/A;   COLONOSCOPY     COLONOSCOPY W/ POLYPECTOMY  2009   CRANIOTOMY Left 08/24/2020   Procedure: Left Craniotomy for Tumor Resection with Brainlab;  Surgeon: Jadene Pierini, MD;  Location: [facility] OR;  Service: Neurosurgery;  Laterality: Left;   HEMORRHOID SURGERY     INTERCOSTAL NERVE BLOCK Right 09/24/2020   Procedure: INTERCOSTAL NERVE BLOCK;  Surgeon: Loreli Slot, MD;  Location: [facility] OR;  Service: Thoracic;  Laterality: Right;   IR IMAGING GUIDED PORT INSERTION  05/15/2022   JOINT REPLACEMENT     LYMPH NODE DISSECTION Right 09/24/2020   Procedure: LYMPH NODE DISSECTION;  Surgeon: Loreli Slot, MD;  Location: [facility] OR;  Service: Thoracic;  Laterality: Right;   POLYPECTOMY     right total knee arthroplasty     Dr. Sherle Poe 06-04-18   SPINE SURGERY  08/16/2009   TOTAL KNEE ARTHROPLASTY Left 02/13/2014   Procedure: TOTAL KNEE ARTHROPLASTY;  Surgeon: Harvie Junior, MD;  Location: [facility] OR;  Service: Orthopedics;  Laterality: Left;   TOTAL KNEE ARTHROPLASTY Right 06/04/2018   Procedure: RIGHT TOTAL KNEE ARTHROPLASTY;  Surgeon: Jodi Geralds, MD;  Location: [facility] ORS;  Service: Orthopedics;  Laterality: Right;  Adductor Block   TUBAL LIGATION      REVIEW OF SYSTEMS:   Constitutional: Positive for fatigue. Negative for appetite change, chills, fever and unexpected weight change.  HENT: Negative for mouth sores, nosebleeds, sore throat and trouble swallowing.   Eyes: Negative for eye problems and icterus.  Respiratory: Negative for cough, hemoptysis, shortness of breath and wheezing.   Cardiovascular: Negative for chest pain and leg swelling.  Gastrointestinal: Negative for abdominal pain, constipation, diarrhea, nausea and vomiting.  Genitourinary: Negative for bladder incontinence, difficulty urinating, dysuria, frequency and hematuria.   Musculoskeletal: Negative for back pain, gait problem, neck pain and neck stiffness.  Skin: Positive for skin darkening. Negative for rash and  pruritis.  Neurological: Negative for dizziness, extremity weakness, gait problem, headaches, light-headedness and seizures.  Hematological: Negative for adenopathy. Does not bruise/bleed easily.  Psychiatric/Behavioral: Negative for confusion, depression and sleep disturbance. The patient is not nervous/anxious     PHYSICAL EXAMINATION:  Blood pressure 129/73, pulse (!) 58, temperature 98.3 F (36.8 C), resp. rate 18, weight 122 lb 14.4 oz (55.7 kg), SpO2 100 %.  ECOG PERFORMANCE STATUS: 1  Physical Exam  Constitutional: Oriented to person, place, and time and thin appearing female and in no distress. HENT: Head: Normocephalic and atraumatic. Mouth/Throat: Oropharynx is clear and moist. No oropharyngeal exudate. No thrush.  Eyes: Conjunctivae are normal. Right eye exhibits no discharge. Left eye exhibits no discharge. No scleral icterus. Neck: Normal range of motion. Neck supple. Cardiovascular: Normal rate, regular rhythm, systolic murmur noted and intact distal pulses.   Abdominal: Soft. Bowel sounds are normal. Exhibits no distension and no mass. There is no tenderness.  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 No weakness in hands. No decreased sensation. No swelling or erythema. Normal range of motion. Exhibits no edema.  Lymphadenopathy:    No cervical adenopathy.  Neurological: Alert and oriented to person, place, and time. Exhibits normal muscle tone. Gait normal. Coordination normal.  Skin: Skin is warm and dry. Positive for skin darkening around heel. No swelling, tenderness, or erythema. Not diaphoretic. No erythema. No pallor.  Psychiatric: Mood, memory and judgment normal.  Vitals reviewed.  LABORATORY DATA: Lab Results  Component Value Date   WBC 7.3 07/24/2022   HGB 11.4 (L) 07/24/2022   HCT 35.4 (L) 07/24/2022   MCV 89.8 07/24/2022   PLT 340 07/24/2022      Chemistry      Component Value Date/Time   NA 138 07/24/2022 1010   K 4.0 07/24/2022 1010    CL 103 07/24/2022 1010   CO2 31 07/24/2022 1010   BUN 18 07/24/2022 1010   CREATININE 0.75 07/24/2022 1010   CREATININE 0.79 03/14/2020 1016      Component Value Date/Time   CALCIUM 9.7 07/24/2022 1010   ALKPHOS 77 07/24/2022 1010   AST 18 07/24/2022 1010   ALT 17 07/24/2022 1010   BILITOT 0.2 (L) 07/24/2022 1010       RADIOGRAPHIC STUDIES:  CT Abdomen Pelvis W Contrast  Result Date: 07/22/2022 CLINICAL DATA:  Non-small-cell lung cancer EXAM: CT CHEST, ABDOMEN, AND PELVIS WITH CONTRAST TECHNIQUE: Multidetector CT imaging of the chest, abdomen and pelvis was performed following the standard protocol during bolus administration of intravenous contrast. RADIATION DOSE REDUCTION: This exam was performed according to the departmental dose-optimization program which includes automated exposure control, adjustment of the mA and/or kV according to patient size and/or use of iterative reconstruction technique. CONTRAST:  ISOVUE-300 IOPAMIDOL (ISOVUE-300) INJECTION 61% COMPARISON:  CT 04/29/2022 and older. Chest x-ray 05/28/2022 and older FINDINGS: CT CHEST FINDINGS Cardiovascular: Right upper chest port. Heart is nonenlarged. No significant pericardial fluid. The thoracic aorta has some scattered partially calcified atherosclerotic plaque. Some of the areas of plaque are regular. Coronary artery calcifications are seen. Normal caliber main pulmonary arteries. Mediastinum/Nodes: Small heterogeneous thyroid gland, unchanged. No specific abnormal lymph node enlargement present in the axillary regions, hilum or mediastinum. Persistent mild wall thickening diffusely along the thoracic esophagus. Please correlate with any symptoms. Lungs/Pleura: No consolidation, pneumothorax or effusion. Slight breathing motion identified. Stable 3 mm nodule left lower lobe on series 4, image 105. Mild left lower lobe bronchiectasis. There are some paraseptal changes medially in the left upper lobe. Stable 3 mm left  lower lobe nodule on image 70 of series 4. This measured 2 mm previously but the differences could relate to slice misregistration and breathing motion. Stable surgical changes in the anterior right upper lung with  volume loss. There is some persistent thickening with some calcifications along the margin of the fissure on image 52 which could be postsurgical. Recommend simple continued surveillance. Elevated right hemidiaphragm related to the volume loss. No new dominant right lung nodule. Musculoskeletal: Curvature and degenerative changes seen along the spine. CT ABDOMEN PELVIS FINDINGS Hepatobiliary: Fatty liver infiltration identified. No space-occupying liver lesion. The gallbladder is nondilated. Patent portal vein. Question gallstones. Pancreas: Preserved pancreatic parenchyma without obvious enhancing mass. Stable mild ectasia of the pancreatic duct. Spleen: Spleen is nonenlarged. Adrenals/Urinary Tract: Adrenal glands are preserved. Stable mild thickening of the left. Mild bilateral renal atrophy no collecting system dilatation. Ureters have normal course and caliber down to the bladder. Preserved contours of the urinary bladder. Stomach/Bowel: Large bowel is of normal course and caliber with scattered stool. Normal retrocecal appendix. There is some contrast and debris in the distended stomach. Slight atypical distribution of small bowel. The distal duodenal and proximal jejunal extends in the right mid mesentery. Slight swirling of mesenteric vessels in the pelvis but appearance is unchanged and no small bowel dilatation. Vascular/Lymphatic: Normal caliber IVC. Minimal ectasia of the infrarenal abdominal aorta with diameter approaching 2.9 cm. Scattered plaque. Significant atherosclerotic disease as well along the iliac vessels with some areas of stenosis. Please correlate with any particular symptomatology. No specific abnormal lymph node enlargement identified in the abdomen and pelvis. Reproductive:  Status post hysterectomy. No adnexal masses. Other: No abdominal or pelvic wall hernia.  No clear ascites. Musculoskeletal: Streak artifact related to the spinal fixation hardware in the lumbar region. Diffuse degenerative changes are noted. Trace retrolisthesis of L5 on S1 and anterolisthesis of L4 on L5. Osteopenia. There is some sclerosis along the right hemipelvis on image 102, unchanged from previous. Favor a Office manager. If there is concern of osseous metastatic disease bone scan could be considered. Focal lipoma identified anterior to the right hip, unchanged. IMPRESSION: Stable surgical changes along the right hemithorax. Stable thickening along the surgical margin but no new mass lesion, fluid collection or lymph node enlargement. Stable mild ectasia of the inferior abdominal aorta with scattered plaque. Recommend follow-up ultrasound every 5 years. This recommendation follows ACR consensus guidelines: White Paper of the ACR Incidental Findings Committee II on Vascular Findings. J Am Coll Radiol 2013; 10:789-794. Fatty liver infiltration. Sigmoid colon diverticula. Stable mild thickening of the left adrenal gland. Electronically Signed   By: Karen Kays M.D.   On: 07/22/2022 12:34   CT Chest W Contrast  Result Date: 07/22/2022 CLINICAL DATA:  Non-small-cell lung cancer EXAM: CT CHEST, ABDOMEN, AND PELVIS WITH CONTRAST TECHNIQUE: Multidetector CT imaging of the chest, abdomen and pelvis was performed following the standard protocol during bolus administration of intravenous contrast. RADIATION DOSE REDUCTION: This exam was performed according to the departmental dose-optimization program which includes automated exposure control, adjustment of the mA and/or kV according to patient size and/or use of iterative reconstruction technique. CONTRAST:  ISOVUE-300 IOPAMIDOL (ISOVUE-300) INJECTION 61% COMPARISON:  CT 04/29/2022 and older. Chest x-ray 05/28/2022 and older FINDINGS: CT CHEST FINDINGS  Cardiovascular: Right upper chest port. Heart is nonenlarged. No significant pericardial fluid. The thoracic aorta has some scattered partially calcified atherosclerotic plaque. Some of the areas of plaque are regular. Coronary artery calcifications are seen. Normal caliber main pulmonary arteries. Mediastinum/Nodes: Small heterogeneous thyroid gland, unchanged. No specific abnormal lymph node enlargement present in the axillary regions, hilum or mediastinum. Persistent mild wall thickening diffusely along the thoracic esophagus. Please correlate with any symptoms. Lungs/Pleura:  No consolidation, pneumothorax or effusion. Slight breathing motion identified. Stable 3 mm nodule left lower lobe on series 4, image 105. Mild left lower lobe bronchiectasis. There are some paraseptal changes medially in the left upper lobe. Stable 3 mm left lower lobe nodule on image 70 of series 4. This measured 2 mm previously but the differences could relate to slice misregistration and breathing motion. Stable surgical changes in the anterior right upper lung with volume loss. There is some persistent thickening with some calcifications along the margin of the fissure on image 52 which could be postsurgical. Recommend simple continued surveillance. Elevated right hemidiaphragm related to the volume loss. No new dominant right lung nodule. Musculoskeletal: Curvature and degenerative changes seen along the spine. CT ABDOMEN PELVIS FINDINGS Hepatobiliary: Fatty liver infiltration identified. No space-occupying liver lesion. The gallbladder is nondilated. Patent portal vein. Question gallstones. Pancreas: Preserved pancreatic parenchyma without obvious enhancing mass. Stable mild ectasia of the pancreatic duct. Spleen: Spleen is nonenlarged. Adrenals/Urinary Tract: Adrenal glands are preserved. Stable mild thickening of the left. Mild bilateral renal atrophy no collecting system dilatation. Ureters have normal course and caliber down to  the bladder. Preserved contours of the urinary bladder. Stomach/Bowel: Large bowel is of normal course and caliber with scattered stool. Normal retrocecal appendix. There is some contrast and debris in the distended stomach. Slight atypical distribution of small bowel. The distal duodenal and proximal jejunal extends in the right mid mesentery. Slight swirling of mesenteric vessels in the pelvis but appearance is unchanged and no small bowel dilatation. Vascular/Lymphatic: Normal caliber IVC. Minimal ectasia of the infrarenal abdominal aorta with diameter approaching 2.9 cm. Scattered plaque. Significant atherosclerotic disease as well along the iliac vessels with some areas of stenosis. Please correlate with any particular symptomatology. No specific abnormal lymph node enlargement identified in the abdomen and pelvis. Reproductive: Status post hysterectomy. No adnexal masses. Other: No abdominal or pelvic wall hernia.  No clear ascites. Musculoskeletal: Streak artifact related to the spinal fixation hardware in the lumbar region. Diffuse degenerative changes are noted. Trace retrolisthesis of L5 on S1 and anterolisthesis of L4 on L5. Osteopenia. There is some sclerosis along the right hemipelvis on image 102, unchanged from previous. Favor a Office manager. If there is concern of osseous metastatic disease bone scan could be considered. Focal lipoma identified anterior to the right hip, unchanged. IMPRESSION: Stable surgical changes along the right hemithorax. Stable thickening along the surgical margin but no new mass lesion, fluid collection or lymph node enlargement. Stable mild ectasia of the inferior abdominal aorta with scattered plaque. Recommend follow-up ultrasound every 5 years. This recommendation follows ACR consensus guidelines: White Paper of the ACR Incidental Findings Committee II on Vascular Findings. J Am Coll Radiol 2013; 10:789-794. Fatty liver infiltration. Sigmoid colon diverticula. Stable mild  thickening of the left adrenal gland. Electronically Signed   By: Karen Kays M.D.   On: 07/22/2022 12:34   DG Ankle Complete Left  Result Date: 06/29/2022 CLINICAL DATA:  Greater than week left ankle pain. Tender Achilles. Swelling and skin discoloration of heel. EXAM: LEFT ANKLE COMPLETE - 3+ VIEW COMPARISON:  None Available. FINDINGS: Mildly decreased bone mineralization. Minimal distal medial malleolar degenerative spurring. The ankle mortise is symmetric and intact. No acute fracture or dislocation. Small plantar calcaneal heel spur. Mild dorsal talonavicular degenerative spurring. IMPRESSION: 1. Small plantar calcaneal heel spur. 2. Minimal medial malleolar degenerative spurring. Electronically Signed   By: Neita Garnet M.D.   On: 06/29/2022 14:45  ASSESSMENT/PLAN:  This is a very pleasant 72 year old African-American female diagnosed with stage IV (T3, N0, M1 B) non-small cell lung cancer, adenocarcinoma.  She presented with a right upper lobe lung mass with a solitary brain metastasis.  She was diagnosed in February 2022.  Her PD-L1 expression is 90%.  She does not have any actionable mutations.   The patient completed SRS followed by craniotomy and resection on 08/23/2020 under the care of Dr. [name] and craniotomy under the care of Dr. Maurice Small on 08/24/2020.   She then had a robot-assisted right upper lobectomy with en bloc wedge resection of the right middle lobe and lymph node dissection under the care of Dr. Dorris Fetch on September 24, 2020.  She underwent SRS to the two new subcentimeter brain metastases on 11/29/20.   The patient is currently undergoing systemic chemotherapy with carboplatin for an AUC of 5, Alimta 500 mg per metered squared, Keytruda 200 mg IV every 3 weeks.  She is status post 28 cycles and tolerated it well without any adverse side effects.  Starting from cycle #5, the patient started maintenance Alimta and Keytruda.    She recently had progessive metastatic  disease to the brain and is status post SRS which was completed on 11/18/21.  She also had the LITT brain procedure Dr. Shaune Pollack at [place] in April 2023.   Patient recently had a restaging CT scan performed.  Dr. Arbutus Ped personally apparently reviewed the scan discussed results with the patient today.  Scan showed no evidence of disease progression.  Dr. Arbutus Ped recommended the patient continue on the current treatment at the same dose.  She will receive cycle #29 today as scheduled.  We will see her back for follow-up visit in 3 weeks for evaluation repeat blood work before starting cycle #30.  She is scheduled for a brain MRI 07/25/2022.  The patient will have a follow-up visit with neuro-oncology following that. She will continue her decadron for now until she sees them next week.   The patient was advised to call immediately if she has any concerning symptoms in the interval. The patient voices understanding of current disease status and treatment options and is in agreement with the current care plan. All questions were answered. The patient knows to call the clinic with any problems, questions or concerns. We can certainly see the patient much sooner if necessary     Orders Placed This Encounter  Procedures   CBC with Differential (Cancer Center Only)    Standing Status:   Future    Standing Expiration Date:   09/26/2023   CMP (Cancer Center only)    Standing Status:   Future    Standing Expiration Date:   09/26/2023   CBC with Differential (Cancer Center Only)    Standing Status:   Future    Standing Expiration Date:   10/17/2023   CMP (Cancer Center only)    Standing Status:   Future    Standing Expiration Date:   10/17/2023   T4    Standing Status:   Future    Standing Expiration Date:   10/17/2023   TSH    Standing Status:   Future    Standing Expiration Date:   10/17/2023   CBC with Differential (Cancer Center Only)    Standing Status:   Future    Standing Expiration Date:    11/07/2023   CMP (Cancer Center only)    Standing Status:   Future    Standing Expiration Date:  11/07/2023      Poetry Cerro L Deserea Bordley, PA-C 07/24/22  ADDENDUM: Hematology/Oncology Attending: I had a face-to-face encounter with the patient today.  I reviewed her records, lab, scan and recommended her care plan.  This is a very pleasant 72 years old African-American female who came to the clinic today accompanied by her daughter.  The patient has stage IV non-small cell lung cancer, adenocarcinoma presented with right upper lobe lung mass in addition to solitary brain metastasis in February 2022 with no actionable mutations.  PD-L1 expression of 90%.  She is status post SRS to the solitary brain metastasis followed by craniotomy and surgical resection followed by right upper lobectomy with en bloc wedge resection of the right middle lobe and lymph node dissection under the care of Dr. Dorris Fetch.  The patient also has SRS to 2 new subcentimeter brain metastasis and another SRS in May 2023.  She has been on treatment with systemic chemotherapy initially with carboplatin, Alimta and Keytruda for 4 cycles started Nov 08, 2020 followed by maintenance treatment with Alimta and Keytruda for 24 more cycles.  The patient has been tolerating this treatment well with no concerning adverse effects. She had repeat CT scan of the chest, abdomen and pelvis performed recently.  I personally and independently reviewed the scan and discussed the result with the patient and her daughter. Her scan showed no concerning findings for disease progression. I recommended for her to continue her current treatment with the maintenance therapy and she will proceed with cycle #29 today. She will come back for follow-up visit in 3 weeks for evaluation before the next cycle of her treatment. The patient will continue to taper her dose of Decadron as her neurological symptoms improved. She was advised to call immediately if  she has any other concerning symptoms in the interval. The total time spent in the appointment was 30 minutes. Disclaimer: This note was dictated with voice recognition software. Similar sounding words can inadvertently be transcribed and may be missed upon review.  Lajuana Matte, MD

## 2022-07-24 ENCOUNTER — Other Ambulatory Visit: Payer: Self-pay | Admitting: Radiation Therapy

## 2022-07-24 ENCOUNTER — Inpatient Hospital Stay: Payer: Medicare HMO

## 2022-07-24 ENCOUNTER — Other Ambulatory Visit: Payer: Medicare HMO

## 2022-07-24 ENCOUNTER — Ambulatory Visit: Payer: Medicare HMO

## 2022-07-24 ENCOUNTER — Other Ambulatory Visit: Payer: Self-pay | Admitting: Internal Medicine

## 2022-07-24 ENCOUNTER — Inpatient Hospital Stay (HOSPITAL_BASED_OUTPATIENT_CLINIC_OR_DEPARTMENT_OTHER): Payer: Medicare HMO | Admitting: Physician Assistant

## 2022-07-24 ENCOUNTER — Other Ambulatory Visit: Payer: Self-pay

## 2022-07-24 ENCOUNTER — Ambulatory Visit: Payer: Medicare HMO | Admitting: Internal Medicine

## 2022-07-24 VITALS — BP 129/73 | HR 58 | Temp 98.3°F | Resp 18 | Wt 122.9 lb

## 2022-07-24 VITALS — BP 133/62 | HR 57 | Resp 16

## 2022-07-24 DIAGNOSIS — C7931 Secondary malignant neoplasm of brain: Secondary | ICD-10-CM

## 2022-07-24 DIAGNOSIS — Z5112 Encounter for antineoplastic immunotherapy: Secondary | ICD-10-CM | POA: Diagnosis not present

## 2022-07-24 DIAGNOSIS — C3411 Malignant neoplasm of upper lobe, right bronchus or lung: Secondary | ICD-10-CM

## 2022-07-24 DIAGNOSIS — Z5111 Encounter for antineoplastic chemotherapy: Secondary | ICD-10-CM

## 2022-07-24 DIAGNOSIS — F32A Depression, unspecified: Secondary | ICD-10-CM | POA: Diagnosis not present

## 2022-07-24 DIAGNOSIS — M25572 Pain in left ankle and joints of left foot: Secondary | ICD-10-CM | POA: Diagnosis not present

## 2022-07-24 DIAGNOSIS — F419 Anxiety disorder, unspecified: Secondary | ICD-10-CM | POA: Diagnosis not present

## 2022-07-24 DIAGNOSIS — K5900 Constipation, unspecified: Secondary | ICD-10-CM | POA: Diagnosis not present

## 2022-07-24 DIAGNOSIS — M25473 Effusion, unspecified ankle: Secondary | ICD-10-CM | POA: Diagnosis not present

## 2022-07-24 DIAGNOSIS — Z95828 Presence of other vascular implants and grafts: Secondary | ICD-10-CM

## 2022-07-24 LAB — CMP (CANCER CENTER ONLY)
ALT: 17 U/L (ref 0–44)
AST: 18 U/L (ref 15–41)
Albumin: 3.6 g/dL (ref 3.5–5.0)
Alkaline Phosphatase: 77 U/L (ref 38–126)
Anion gap: 4 — ABNORMAL LOW (ref 5–15)
BUN: 18 mg/dL (ref 8–23)
CO2: 31 mmol/L (ref 22–32)
Calcium: 9.7 mg/dL (ref 8.9–10.3)
Chloride: 103 mmol/L (ref 98–111)
Creatinine: 0.75 mg/dL (ref 0.44–1.00)
GFR, Estimated: >60 mL/min (ref >60)
Glucose, Bld: 142 mg/dL — ABNORMAL HIGH (ref 70–99)
Potassium: 4 mmol/L (ref 3.5–5.1)
Sodium: 138 mmol/L (ref 135–145)
Total Bilirubin: 0.2 mg/dL — ABNORMAL LOW (ref 0.3–1.2)
Total Protein: 6.7 g/dL (ref 6.5–8.1)

## 2022-07-24 LAB — CBC WITH DIFFERENTIAL (CANCER CENTER ONLY)
Abs Immature Granulocytes: 0.07 10*3/uL (ref 0.00–0.07)
Basophils Absolute: 0 10*3/uL (ref 0.0–0.1)
Basophils Relative: 0 %
Eosinophils Absolute: 0 10*3/uL (ref 0.0–0.5)
Eosinophils Relative: 0 %
HCT: 35.4 % — ABNORMAL LOW (ref 36.0–46.0)
Hemoglobin: 11.4 g/dL — ABNORMAL LOW (ref 12.0–15.0)
Immature Granulocytes: 1 %
Lymphocytes Relative: 24 %
Lymphs Abs: 1.8 10*3/uL (ref 0.7–4.0)
MCH: 28.9 pg (ref 26.0–34.0)
MCHC: 32.2 g/dL (ref 30.0–36.0)
MCV: 89.8 fL (ref 80.0–100.0)
Monocytes Absolute: 0.8 10*3/uL (ref 0.1–1.0)
Monocytes Relative: 11 %
Neutro Abs: 4.7 10*3/uL (ref 1.7–7.7)
Neutrophils Relative %: 64 %
Platelet Count: 340 10*3/uL (ref 150–400)
RBC: 3.94 MIL/uL (ref 3.87–5.11)
RDW: 13.9 % (ref 11.5–15.5)
WBC Count: 7.3 10*3/uL (ref 4.0–10.5)
nRBC: 0 % (ref 0.0–0.2)

## 2022-07-24 MED ORDER — SODIUM CHLORIDE 0.9 % IV SOLN
200.0000 mg | Freq: Once | INTRAVENOUS | Status: AC
  Administered 2022-07-24: 200 mg via INTRAVENOUS
  Filled 2022-07-24: qty 200

## 2022-07-24 MED ORDER — SODIUM CHLORIDE 0.9% FLUSH
10.0000 mL | INTRAVENOUS | Status: DC | PRN
  Administered 2022-07-24: 10 mL

## 2022-07-24 MED ORDER — SODIUM CHLORIDE 0.9% FLUSH
10.0000 mL | INTRAVENOUS | Status: AC | PRN
  Administered 2022-07-24: 10 mL

## 2022-07-24 MED ORDER — SODIUM CHLORIDE 0.9 % IV SOLN: Freq: Once | INTRAVENOUS | Status: AC

## 2022-07-24 MED ORDER — HEPARIN SOD (PORK) LOCK FLUSH 100 UNIT/ML IV SOLN
500.0000 [IU] | Freq: Once | INTRAVENOUS | Status: AC | PRN
  Administered 2022-07-24: 500 [IU]

## 2022-07-24 MED ORDER — SODIUM CHLORIDE 0.9 % IV SOLN
500.0000 mg/m2 | Freq: Once | INTRAVENOUS | Status: AC
  Administered 2022-07-24: 800 mg via INTRAVENOUS
  Filled 2022-07-24: qty 20

## 2022-07-24 MED ORDER — PROCHLORPERAZINE MALEATE 10 MG PO TABS
10.0000 mg | ORAL_TABLET | Freq: Once | ORAL | Status: AC
  Administered 2022-07-24: 10 mg via ORAL
  Filled 2022-07-24: qty 1

## 2022-07-24 NOTE — Progress Notes (Signed)
Orders placed for port access and de-access the day of brain MRI at [facility].

## 2022-07-24 NOTE — Patient Instructions (Signed)
[facility]  Discharge Instructions: Thank you for choosing [facility] to provide your oncology and hematology care.   If you have a lab appointment with the [facility], please go directly to the [facility] and check in at the registration area.   Wear comfortable clothing and clothing appropriate for easy access to any Portacath or PICC line.   We strive to give you quality time with your provider. You may need to reschedule your appointment if you arrive late (15 or more minutes).  Arriving late affects you and other patients whose appointments are after yours.  Also, if you miss three or more appointments without notifying the office, you may be dismissed from the clinic at the provider's discretion.      For prescription refill requests, have your pharmacy contact our office and allow 72 hours for refills to be completed.    Today you received the following chemotherapy and/or immunotherapy agents: Keytruda/Alimta      To help prevent nausea and vomiting after your treatment, we encourage you to take your nausea medication as directed.  BELOW ARE SYMPTOMS THAT SHOULD BE REPORTED IMMEDIATELY: *FEVER GREATER THAN 100.4 F (38 C) OR HIGHER *CHILLS OR SWEATING *NAUSEA AND VOMITING THAT IS NOT CONTROLLED WITH YOUR NAUSEA MEDICATION *UNUSUAL SHORTNESS OF BREATH *UNUSUAL BRUISING OR BLEEDING *URINARY PROBLEMS (pain or burning when urinating, or frequent urination) *BOWEL PROBLEMS (unusual diarrhea, constipation, pain near the anus) TENDERNESS IN MOUTH AND THROAT WITH OR WITHOUT PRESENCE OF ULCERS (sore throat, sores in mouth, or a toothache) UNUSUAL RASH, SWELLING OR PAIN  UNUSUAL VAGINAL DISCHARGE OR ITCHING   Items with * indicate a potential emergency and should be followed up as soon as possible or go to the Emergency Department if any problems should occur.  Please show the CHEMOTHERAPY ALERT CARD or IMMUNOTHERAPY ALERT CARD at  check-in to the Emergency Department and triage nurse.  Should you have questions after your visit or need to cancel or reschedule your appointment, please contact [facility]  Dept: [phone]  and follow the prompts.  Office hours are 8:00 a.m. to 4:30 p.m. Monday - Friday. Please note that voicemails left after 4:00 p.m. may not be returned until the following business day.  We are closed weekends and major holidays. You have access to a nurse at all times for urgent questions. Please call the main number to the clinic Dept: [phone] and follow the prompts.   For any non-urgent questions, you may also contact your provider using MyChart. We now offer e-Visits for anyone 3 and older to request care online for non-urgent symptoms. For details visit [email or web address].   Also download the MyChart app! Go to the app store, search "MyChart", open the app, select [facility], and log in with your MyChart username and password.  Masks are optional in the cancer centers. If you would like for your care team to wear a mask while they are taking care of you, please let them know. You may have one support person who is at least 74 years old accompany you for your appointments.

## 2022-07-25 ENCOUNTER — Ambulatory Visit
Admission: RE | Admit: 2022-07-25 | Discharge: 2022-07-25 | Disposition: A | Discharge status: Home / Self Care | Payer: Medicare HMO | Source: Ambulatory Visit | Attending: Internal Medicine | Admitting: Internal Medicine

## 2022-07-25 ENCOUNTER — Other Ambulatory Visit: Payer: Medicare HMO

## 2022-07-25 DIAGNOSIS — C3490 Malignant neoplasm of unspecified part of unspecified bronchus or lung: Secondary | ICD-10-CM

## 2022-07-25 DIAGNOSIS — I63532 Cerebral infarction due to unspecified occlusion or stenosis of left posterior cerebral artery: Secondary | ICD-10-CM | POA: Diagnosis not present

## 2022-07-25 DIAGNOSIS — C7931 Secondary malignant neoplasm of brain: Secondary | ICD-10-CM

## 2022-07-25 MED ORDER — SODIUM CHLORIDE 0.9% FLUSH
10.0000 mL | INTRAVENOUS | Status: DC | PRN
  Administered 2022-07-25: 10 mL via INTRAVENOUS

## 2022-07-25 MED ORDER — HEPARIN SOD (PORK) LOCK FLUSH 100 UNIT/ML IV SOLN
500.0000 [IU] | Freq: Once | INTRAVENOUS | Status: AC
  Administered 2022-07-25: 500 [IU] via INTRAVENOUS

## 2022-07-25 MED ORDER — GADOPICLENOL 0.5 MMOL/ML IV SOLN
6.0000 mL | Freq: Once | INTRAVENOUS | Status: AC | PRN
  Administered 2022-07-25: 6 mL via INTRAVENOUS

## 2022-07-26 ENCOUNTER — Encounter: Payer: Self-pay | Admitting: Internal Medicine

## 2022-07-28 ENCOUNTER — Inpatient Hospital Stay (HOSPITAL_BASED_OUTPATIENT_CLINIC_OR_DEPARTMENT_OTHER): Payer: Medicare HMO | Admitting: Internal Medicine

## 2022-07-28 ENCOUNTER — Other Ambulatory Visit: Payer: Self-pay | Admitting: Radiation Therapy

## 2022-07-28 ENCOUNTER — Other Ambulatory Visit: Payer: Self-pay

## 2022-07-28 ENCOUNTER — Inpatient Hospital Stay: Payer: Medicare HMO

## 2022-07-28 VITALS — BP 112/67 | HR 63 | Temp 97.3°F | Resp 18 | Wt 122.1 lb

## 2022-07-28 DIAGNOSIS — Z5112 Encounter for antineoplastic immunotherapy: Secondary | ICD-10-CM | POA: Diagnosis not present

## 2022-07-28 DIAGNOSIS — C3490 Malignant neoplasm of unspecified part of unspecified bronchus or lung: Secondary | ICD-10-CM

## 2022-07-28 DIAGNOSIS — M25473 Effusion, unspecified ankle: Secondary | ICD-10-CM | POA: Diagnosis not present

## 2022-07-28 DIAGNOSIS — C7931 Secondary malignant neoplasm of brain: Secondary | ICD-10-CM

## 2022-07-28 DIAGNOSIS — F419 Anxiety disorder, unspecified: Secondary | ICD-10-CM | POA: Diagnosis not present

## 2022-07-28 DIAGNOSIS — C3411 Malignant neoplasm of upper lobe, right bronchus or lung: Secondary | ICD-10-CM | POA: Diagnosis not present

## 2022-07-28 DIAGNOSIS — F32A Depression, unspecified: Secondary | ICD-10-CM | POA: Diagnosis not present

## 2022-07-28 DIAGNOSIS — M25572 Pain in left ankle and joints of left foot: Secondary | ICD-10-CM | POA: Diagnosis not present

## 2022-07-28 DIAGNOSIS — Z5111 Encounter for antineoplastic chemotherapy: Secondary | ICD-10-CM | POA: Diagnosis not present

## 2022-07-28 DIAGNOSIS — K5900 Constipation, unspecified: Secondary | ICD-10-CM | POA: Diagnosis not present

## 2022-07-28 NOTE — Progress Notes (Signed)
[facility] [place], [place] [address] [phone]   Interval Evaluation  Date of Service: 07/28/22 Patient Name: Hannah [name] Patient MRN: [id number] Patient DOB: 02/13/1951 Provider: Ventura Sellers, MD  Identifying Statement:  SHRISTI SCHEIB is a 72 y.o. female with brain metastasis   Primary Cancer:  Oncology History  Primary cancer of right upper lobe of lung ([address])  08/07/2020 Initial Diagnosis   Primary cancer of right upper lobe of lung ([place])   09/20/2020 Cancer Staging   Staging form: Lung, AJCC 8th Edition - Clinical: Stage IVB (cT3, cN0, cM1c) - Signed by Curt Bears, MD on 09/20/2020   11/08/2020 - 01/16/2022 Chemotherapy   Patient is on Treatment Plan : LUNG CARBOplatin / Pemetrexed / Pembrolizumab q21d Induction x 4 cycles / Maintenance Pemetrexed + Pembrolizumab     11/16/2020 -  Chemotherapy   Patient is on Treatment Plan : LUNG Carboplatin (5) + Pemetrexed (500) + Pembrolizumab (200) D1 q21d Induction x 4 cycles / Maintenance Pemetrexed (500) + Pembrolizumab (200) D1 q21d      Oncologic History 08/23/20: Pre-op SRS to L frontal mass [facility]) 08/24/20: Craniotomy, resection ([place]) 11/29/20: Salvage SRS x2 [facility] Klippel) 10/14/21: L frontoparietal progression, undergoes LITT at [facility]) 11/18/21: Path is adenocarcinoma, completes 25/5 SRS [facility] Klippel)  Interval History:  Hannah [name] presents today for follow up after recent MRI brain.  Continues to descrive fatigue and increased sleep burden.  Decadron is at 2mg  daily. She continues on keppra and vimpat without frank seizures.  Continues on Pem/Pem with Dr. Julien Nordmann.  H+P (08/02/20) Patient presented to medical attention this past week with several days rapidly progressive speech impairment and right sided weakness.  She and her family describe difficulty putting complete sentences together, impaired use of right arm (for using fork, zipper, brushing  teeth), and dragging of the right leg while walking.  She never needed assistance to walk.  CNS imaging demonstrated left frontal mass, and decadron was started over the weekend 4mg  twice per day.  She feels improved with regards to the right sided weakness with the steroids.  Otherwise no seizures, headaches.  Medications: Current Outpatient Medications on File Prior to Visit  Medication Sig Dispense Refill   acetaminophen (TYLENOL) 325 MG tablet Take 325 mg by mouth every 6 (six) hours as needed for moderate pain.     ALPRAZolam (XANAX XR) 0.5 MG 24 hr tablet Take 1 tablet by mouth once daily 30 tablet 0   ALPRAZolam (XANAX) 0.5 MG tablet TAKE 1 TABLET BY MOUTH THREE TIMES DAILY AS NEEDED FOR ANXIETY 90 tablet 0   aspirin 81 MG tablet Take 1 tablet (81 mg total) by mouth daily. Restart on 08/31/20 30 tablet    bisacodyl (DULCOLAX) 5 MG EC tablet Take 5 mg by mouth daily as needed for moderate constipation.     carboxymethylcellulose (REFRESH PLUS) 0.5 % SOLN Place 1-2 drops into both eyes 3 (three) times daily as needed (dry eyes).     Cholecalciferol (D3 PO) Take 1 capsule by mouth daily.     citalopram (CELEXA) 10 MG tablet Take 1 tablet by mouth once daily 90 tablet 0   dexamethasone (DECADRON) 1 MG tablet Take 1 tablet (1 mg total) by mouth daily with breakfast. 30 tablet 1   fluticasone (FLONASE) 50 MCG/ACT nasal spray PLACE 2 SPRAYS INTO BOTH NOSTRILS DAILY. (Patient taking differently: Place 2 sprays into both nostrils daily as needed for allergies.) 48  g 1   folic acid (FOLVITE) 1 MG tablet Take 1 tablet by mouth once daily 30 tablet 0   ketoconazole (NIZORAL) 2 % cream Apply 1 Application topically daily. 30 g 1   Lacosamide 100 MG TABS Take 1 tablet (100 mg total) by mouth 2 (two) times daily. 60 tablet 3   levETIRAcetam (KEPPRA) 100 MG/ML solution TAKE 15 ML BY MOUTH  TWICE DAILY 473 mL 0   lidocaine-prilocaine (EMLA) cream Apply 1 Application topically as needed. 30 g 2   loratadine  (CLARITIN) 10 MG tablet Take 10 mg by mouth daily as needed for allergies.     magnesium hydroxide (MILK OF MAGNESIA) 800 MG/5ML suspension Take 30 mLs by mouth daily as needed for constipation.     Menthol, Topical Analgesic, (BIOFREEZE EX) Apply 1 application topically as needed (pain).     mirtazapine (REMERON) 30 MG tablet Take 30 mg by mouth at bedtime.     olopatadine (PATANOL) 0.1 % ophthalmic solution Place 1 drop into both eyes 2 (two) times daily as needed for allergies.     omeprazole (PRILOSEC) 40 MG capsule TAKE 1 CAPSULE TWICE DAILY BEFORE MEALS (Patient taking differently: Take 40 mg by mouth daily.) 180 capsule 0   polyethylene glycol (MIRALAX / GLYCOLAX) 17 g packet Take 17 g by mouth 2 (two) times daily. 72 each 0   pravastatin (PRAVACHOL) 40 MG tablet TAKE 1 TABLET EVERY EVENING 90 tablet 3   Probiotic Product (PROBIOTIC DAILY PO) Take 1 capsule by mouth daily.     prochlorperazine (COMPAZINE) 10 MG tablet TAKE 1 TABLET BY MOUTH EVERY 6 HOURS AS NEEDED FOR NAUSEA OR VOMITING 30 tablet 0   simethicone (MYLICON) 80 MG chewable tablet Chew 1 tablet (80 mg total) by mouth 4 (four) times daily as needed for flatulence (Bloating). 30 tablet 0   No current facility-administered medications on file prior to visit.    Allergies:  Allergies  Allergen Reactions   Amlodipine Swelling and Rash    Rash, swelling   Chantix [Varenicline Tartrate] Shortness Of Breath, Swelling and Other (See Comments)    Tongue swell,sob   Clarithromycin Rash   Lisinopril Hives   Simvastatin Hives   Wellbutrin [Bupropion Hcl] Hives   Lipitor [Atorvastatin]     Dizziness per patient   Sertraline     Makes her feel like she is going to kill someone   Past Medical History:  Past Medical History:  Diagnosis Date   Allergy    Anemia    Anxiety    Arthritis    Carpal tunnel syndrome    Constipation    senna C stool softeners help    Depression    Diverticulosis    Dyslipidemia    External  hemorrhoids    GERD (gastroesophageal reflux disease)    Heart murmur    mild-moderate AR   Hiatal hernia    Hyperlipidemia    on meds    Hypertension    Internal hemorrhoids    lung ca with brain mets 07/2020   Osteoarthritis    Pre-diabetes    PVD (peripheral vascular disease) (HCC)    moderate carotid disease   RBBB    Smoker    Vocal cord polyps    Past Surgical History:  Past Surgical History:  Procedure Laterality Date   ABDOMINAL HYSTERECTOMY  9628   APPLICATION OF CRANIAL NAVIGATION N/A 08/24/2020   Procedure: APPLICATION OF CRANIAL NAVIGATION;  Surgeon: Judith Part, MD;  [facility];  Service: Neurosurgery;  Laterality: N/A;   COLONOSCOPY     COLONOSCOPY W/ POLYPECTOMY  2009   CRANIOTOMY Left 08/24/2020   Procedure: Left Craniotomy for Tumor Resection with Brainlab;  Surgeon: Judith Part, MD;  Location: [facility];  Service: Neurosurgery;  Laterality: Left;   HEMORRHOID SURGERY     INTERCOSTAL NERVE BLOCK Right 09/24/2020   Procedure: INTERCOSTAL NERVE BLOCK;  Surgeon: Melrose Nakayama, MD;  Location: [facility];  Service: Thoracic;  Laterality: Right;   IR IMAGING GUIDED PORT INSERTION  05/15/2022   JOINT REPLACEMENT     LYMPH NODE DISSECTION Right 09/24/2020   Procedure: LYMPH NODE DISSECTION;  Surgeon: Melrose Nakayama, MD;  Location: [facility];  Service: Thoracic;  Laterality: Right;   POLYPECTOMY     right total knee arthroplasty     Dr. Emeterio Reeve 06-04-18   [place]  08/16/2009   TOTAL KNEE ARTHROPLASTY Left 02/13/2014   Procedure: TOTAL KNEE ARTHROPLASTY;  Surgeon: Alta Corning, MD;  Location: [facility];  Service: Orthopedics;  Laterality: Left;   TOTAL KNEE ARTHROPLASTY Right 06/04/2018   Procedure: RIGHT TOTAL KNEE ARTHROPLASTY;  Surgeon: Dorna Leitz, MD;  Location: [facility];  Service: Orthopedics;  Laterality: Right;  Adductor Block   TUBAL LIGATION     Social History:  Social History   Socioeconomic History   Marital status: Married     Spouse name: Not on file   Number of children: 2   Years of education: Not on file   Highest education level: Not on file  Occupational History   Not on file  Tobacco Use   Smoking status: Former    Packs/day: 0.25    Years: 40.00    Total pack years: 10.00    Types: Cigarettes    Quit date: 07/09/2020    Years since quitting: 2.0    Passive exposure: Never   Smokeless tobacco: Never   Tobacco comments:    PACK WILL LAST 3 days  Vaping Use   Vaping Use: Never used  Substance and Sexual Activity   Alcohol use: No   Drug use: No   Sexual activity: Not Currently  Other Topics Concern   Not on file  Social History Narrative   Not on file   Social Determinants of Health   Financial Resource Strain: Low Risk  (04/14/2022)   Overall Financial Resource Strain (CARDIA)    Difficulty of Paying Living Expenses: Not hard at all  Food Insecurity: No Food Insecurity (04/14/2022)   Hunger Vital Sign    Worried About Running Out of Food in the Last Year: Never true    [place] in the Last Year: Never true  Transportation Needs: No Transportation Needs (04/14/2022)   PRAPARE - Hydrologist (Medical): No    Lack of Transportation (Non-Medical): No  Physical Activity: Insufficiently Active (04/14/2022)   Exercise Vital Sign    Days of Exercise per Week: 2 days    Minutes of Exercise per Session: 60 min  Stress: No Stress Concern Present (04/14/2022)   [place]    Feeling of Stress : Not at all  Social Connections: [place] (04/14/2022)   Social Connection and Isolation Panel [NHANES]    Frequency of Communication with Friends and Family: More than three times a week    Frequency of Social Gatherings with Friends and Family: More than three times a week  Attends Religious Services: More than 4 times per year    Active Member of Clubs or Organizations: Yes    Attends  Archivist Meetings: More than 4 times per year    Marital Status: Married  Human resources officer Violence: Not At Risk (04/14/2022)   Humiliation, Afraid, Rape, and Kick questionnaire    Fear of Current or Ex-Partner: No    Emotionally Abused: No    Physically Abused: No    Sexually Abused: No   Family History:  Family History  Problem Relation Age of Onset   Cancer Father    Prostate cancer Father    Diabetes Sister    Cancer Sister    Stroke Maternal Grandfather    Colitis Maternal Aunt    Breast cancer Maternal Aunt    Colon cancer Neg Hx    Colon polyps Neg Hx    Rectal cancer Neg Hx    Stomach cancer Neg Hx    Esophageal cancer Neg Hx     Review of Systems: Constitutional: Doesn't report fevers, chills or abnormal weight loss Eyes: Doesn't report blurriness of vision Ears, nose, mouth, throat, and face: Doesn't report sore throat Respiratory: Doesn't report cough, dyspnea or wheezes Cardiovascular: Doesn't report palpitation, chest discomfort  Gastrointestinal:  Doesn't report nausea, constipation, diarrhea GU: Doesn't report incontinence Skin: Doesn't report skin rashes Neurological: Per HPI Musculoskeletal: Doesn't report joint pain Behavioral/Psych: ++anxiety  Physical Exam: Wt Readings from Last 3 Encounters:  07/24/22 122 lb 14.4 oz (55.7 kg)  07/10/22 123 lb (55.8 kg)  07/03/22 124 lb 1.6 oz (56.3 kg)   Temp Readings from Last 3 Encounters:  07/24/22 98.3 F (36.8 C)  07/03/22 97.9 F (36.6 C) (Oral)  06/27/22 98.9 F (37.2 C) (Oral)   BP Readings from Last 3 Encounters:  07/24/22 133/62  07/24/22 129/73  07/10/22 128/76   Pulse Readings from Last 3 Encounters:  07/24/22 (!) 57  07/24/22 (!) 58  07/10/22 100     KPS: 80. General: Alert, cooperative, pleasant, in no acute distress Head: Normal EENT: No conjunctival injection or scleral icterus.  Lungs: Resp effort normal Cardiac: Regular rate Abdomen: Non-distended abdomen Skin:  No rashes cyanosis or petechiae. Extremities: No clubbing or edema  Neurologic Exam: Mental Status: Awake, alert, attentive to examiner. Oriented to self and environment. Language is intact with regards to fluency, comprehension.  Psychomotor slowing, impaired recall. Cranial Nerves: Visual acuity is grossly normal. Visual fields are full. Extra-ocular movements intact. No ptosis. Face is symmetric Motor: Tone and bulk are normal. Power is 5/5 throughout. Reflexes are symmetric, no pathologic reflexes present.  Sensory: Intact to light touch Gait: Independent  Labs: I have reviewed the data as listed    Component Value Date/Time   NA 138 07/24/2022 1010   K 4.0 07/24/2022 1010   CL 103 07/24/2022 1010   CO2 31 07/24/2022 1010   GLUCOSE 142 (H) 07/24/2022 1010   BUN 18 07/24/2022 1010   CREATININE 0.75 07/24/2022 1010   CREATININE 0.79 03/14/2020 1016   CALCIUM 9.7 07/24/2022 1010   PROT 6.7 07/24/2022 1010   ALBUMIN 3.6 07/24/2022 1010   AST 18 07/24/2022 1010   ALT 17 07/24/2022 1010   ALKPHOS 77 07/24/2022 1010   BILITOT 0.2 (L) 07/24/2022 1010   GFRNONAA >60 07/24/2022 1010   GFRNONAA 75 03/14/2020 1016   GFRAA 87 03/14/2020 1016   Lab Results  Component Value Date   WBC 7.3 07/24/2022   NEUTROABS 4.7 07/24/2022  HGB 11.4 (L) 07/24/2022   HCT 35.4 (L) 07/24/2022   MCV 89.8 07/24/2022   PLT 340 07/24/2022    Imaging:  [facility] Clinician Interpretation: I have personally reviewed the CNS images as listed.  My interpretation, in the context of the patient's clinical presentation, is treatment effect vs true progression  MR BRAIN W WO CONTRAST  Result Date: 07/26/2022 CLINICAL DATA:  72 year old female with metastatic lung cancer. History of SRS in February 2022 two a solitary left frontal metastasis followed by resection. SRS treatment of 2 new lesions June of 2022. LITT therapy for progression/recurrence at the left parietal treatment area in April 2023. And subsequent  post treatment SRS to that area in May 2023. Recurrent enhancement of a 3 mm previously treated right temporal lesion in October 2023. Subsequent encounter. EXAM: MRI HEAD WITHOUT AND WITH CONTRAST TECHNIQUE: Multiplanar, multiecho pulse sequences of the brain and surrounding structures were obtained without and with intravenous contrast. CONTRAST:  6 mL Vueway COMPARISON:  Brain MRI 04/25/2022 and earlier. FINDINGS: Brain: Left posterior percutaneous treatment site which appears to be lateral left occipital lobe on series 14, image 22 appears stable in size and configuration with a 1.8 cm area of mixed enhancement and T1 intrinsic chronic blood products. Regional FLAIR hyperintensity here is stable. No significant mass effect. Left superior frontal gyrus treatment area, prior resection appears stable on series 14, image 138 with mild stellate residual enhancement. Regional FLAIR hyperintensity is stable. Stable regional hemosiderin. No significant regional mass effect. Stable small triangular focus of posterior right temporal lobe enhancement on series 14, image 76. Minimal T2 and FLAIR hyperintensity there appears stable without mass effect. No new areas of abnormal intracranial enhancement identified. No suspicious dural thickening. No superimposed restricted diffusion to suggest acute infarction. No midline shift, mass effect, ventriculomegaly, or acute intracranial hemorrhage. Cervicomedullary junction and pituitary are within normal limits. Stable gray and white matter signal elsewhere. No new signal abnormality identified. Vascular: Major intracranial vascular flow voids are stable. The major dural venous sinuses appear to be enhancing on series 15, patent. Skull and upper cervical spine: Chronic postoperative changes to the left skull in 2 places. Visible bone marrow signal is stable. Negative for age visible cervical spine. Negative visible spinal cord. Sinuses/Orbits: Stable. Other: Mastoid air cells  remain well aerated. Visible internal auditory structures appear normal. Chronic submandibular gland ductal ectasia. Stable scalp soft tissues. IMPRESSION: Stable post treatment appearance of the brain since 04/25/2022. No new or progressive lesions. Electronically Signed   By: Genevie Ann M.D.   On: 07/26/2022 13:36   CT Abdomen Pelvis W Contrast  Result Date: 07/22/2022 CLINICAL DATA:  Non-small-cell lung cancer EXAM: CT CHEST, ABDOMEN, AND PELVIS WITH CONTRAST TECHNIQUE: Multidetector CT imaging of the chest, abdomen and pelvis was performed following the standard protocol during bolus administration of intravenous contrast. RADIATION DOSE REDUCTION: This exam was performed according to the departmental dose-optimization program which includes automated exposure control, adjustment of the mA and/or kV according to patient size and/or use of iterative reconstruction technique. CONTRAST:  133mL ISOVUE-300 IOPAMIDOL (ISOVUE-300) INJECTION 61% COMPARISON:  CT 04/29/2022 and older. Chest x-ray 05/28/2022 and older FINDINGS: CT CHEST FINDINGS Cardiovascular: Right upper chest port. Heart is nonenlarged. No significant pericardial fluid. The thoracic aorta has some scattered partially calcified atherosclerotic plaque. Some of the areas of plaque are regular. Coronary artery calcifications are seen. Normal caliber main pulmonary arteries. Mediastinum/Nodes: Small heterogeneous thyroid gland, unchanged. No specific abnormal lymph node enlargement present in the axillary  regions, hilum or mediastinum. Persistent mild wall thickening diffusely along the thoracic esophagus. Please correlate with any symptoms. Lungs/Pleura: No consolidation, pneumothorax or effusion. Slight breathing motion identified. Stable 3 mm nodule left lower lobe on series 4, image 105. Mild left lower lobe bronchiectasis. There are some paraseptal changes medially in the left upper lobe. Stable 3 mm left lower lobe nodule on image 70 of series 4. This  measured 2 mm previously but the differences could relate to slice misregistration and breathing motion. Stable surgical changes in the anterior right upper lung with volume loss. There is some persistent thickening with some calcifications along the margin of the fissure on image 52 which could be postsurgical. Recommend simple continued surveillance. Elevated right hemidiaphragm related to the volume loss. No new dominant right lung nodule. Musculoskeletal: Curvature and degenerative changes seen along the spine. CT ABDOMEN PELVIS FINDINGS Hepatobiliary: Fatty liver infiltration identified. No space-occupying liver lesion. The gallbladder is nondilated. Patent portal vein. Question gallstones. Pancreas: Preserved pancreatic parenchyma without obvious enhancing mass. Stable mild ectasia of the pancreatic duct. Spleen: Spleen is nonenlarged. Adrenals/Urinary Tract: Adrenal glands are preserved. Stable mild thickening of the left. Mild bilateral renal atrophy no collecting system dilatation. Ureters have normal course and caliber down to the bladder. Preserved contours of the urinary bladder. Stomach/Bowel: Large bowel is of normal course and caliber with scattered stool. Normal retrocecal appendix. There is some contrast and debris in the distended stomach. Slight atypical distribution of small bowel. The distal duodenal and proximal jejunal extends in the right mid mesentery. Slight swirling of mesenteric vessels in the pelvis but appearance is unchanged and no small bowel dilatation. Vascular/Lymphatic: Normal caliber IVC. Minimal ectasia of the infrarenal abdominal aorta with diameter approaching 2.9 cm. Scattered plaque. Significant atherosclerotic disease as well along the iliac vessels with some areas of stenosis. Please correlate with any particular symptomatology. No specific abnormal lymph node enlargement identified in the abdomen and pelvis. Reproductive: Status post hysterectomy. No adnexal masses.  Other: No abdominal or pelvic wall hernia.  No clear ascites. Musculoskeletal: Streak artifact related to the spinal fixation hardware in the lumbar region. Diffuse degenerative changes are noted. Trace retrolisthesis of L5 on S1 and anterolisthesis of L4 on L5. Osteopenia. There is some sclerosis along the right hemipelvis on image 102, unchanged from previous. Favor a Office manager. If there is concern of osseous metastatic disease bone scan could be considered. Focal lipoma identified anterior to the right hip, unchanged. IMPRESSION: Stable surgical changes along the right hemithorax. Stable thickening along the surgical margin but no new mass lesion, fluid collection or lymph node enlargement. Stable mild ectasia of the inferior abdominal aorta with scattered plaque. Recommend follow-up ultrasound every 5 years. This recommendation follows ACR consensus guidelines: White Paper of the ACR Incidental Findings Committee II on Vascular Findings. J Am Coll Radiol 2013; 10:789-794. Fatty liver infiltration. Sigmoid colon diverticula. Stable mild thickening of the left adrenal gland. Electronically Signed   By: Jill Side M.D.   On: 07/22/2022 12:34   CT Chest W Contrast  Result Date: 07/22/2022 CLINICAL DATA:  Non-small-cell lung cancer EXAM: CT CHEST, ABDOMEN, AND PELVIS WITH CONTRAST TECHNIQUE: Multidetector CT imaging of the chest, abdomen and pelvis was performed following the standard protocol during bolus administration of intravenous contrast. RADIATION DOSE REDUCTION: This exam was performed according to the departmental dose-optimization program which includes automated exposure control, adjustment of the mA and/or kV according to patient size and/or use of iterative reconstruction technique. CONTRAST:  173mL  ISOVUE-300 IOPAMIDOL (ISOVUE-300) INJECTION 61% COMPARISON:  CT 04/29/2022 and older. Chest x-ray 05/28/2022 and older FINDINGS: CT CHEST FINDINGS Cardiovascular: Right upper chest port. Heart is  nonenlarged. No significant pericardial fluid. The thoracic aorta has some scattered partially calcified atherosclerotic plaque. Some of the areas of plaque are regular. Coronary artery calcifications are seen. Normal caliber main pulmonary arteries. Mediastinum/Nodes: Small heterogeneous thyroid gland, unchanged. No specific abnormal lymph node enlargement present in the axillary regions, hilum or mediastinum. Persistent mild wall thickening diffusely along the thoracic esophagus. Please correlate with any symptoms. Lungs/Pleura: No consolidation, pneumothorax or effusion. Slight breathing motion identified. Stable 3 mm nodule left lower lobe on series 4, image 105. Mild left lower lobe bronchiectasis. There are some paraseptal changes medially in the left upper lobe. Stable 3 mm left lower lobe nodule on image 70 of series 4. This measured 2 mm previously but the differences could relate to slice misregistration and breathing motion. Stable surgical changes in the anterior right upper lung with volume loss. There is some persistent thickening with some calcifications along the margin of the fissure on image 52 which could be postsurgical. Recommend simple continued surveillance. Elevated right hemidiaphragm related to the volume loss. No new dominant right lung nodule. Musculoskeletal: Curvature and degenerative changes seen along the spine. CT ABDOMEN PELVIS FINDINGS Hepatobiliary: Fatty liver infiltration identified. No space-occupying liver lesion. The gallbladder is nondilated. Patent portal vein. Question gallstones. Pancreas: Preserved pancreatic parenchyma without obvious enhancing mass. Stable mild ectasia of the pancreatic duct. Spleen: Spleen is nonenlarged. Adrenals/Urinary Tract: Adrenal glands are preserved. Stable mild thickening of the left. Mild bilateral renal atrophy no collecting system dilatation. Ureters have normal course and caliber down to the bladder. Preserved contours of the urinary  bladder. Stomach/Bowel: Large bowel is of normal course and caliber with scattered stool. Normal retrocecal appendix. There is some contrast and debris in the distended stomach. Slight atypical distribution of small bowel. The distal duodenal and proximal jejunal extends in the right mid mesentery. Slight swirling of mesenteric vessels in the pelvis but appearance is unchanged and no small bowel dilatation. Vascular/Lymphatic: Normal caliber IVC. Minimal ectasia of the infrarenal abdominal aorta with diameter approaching 2.9 cm. Scattered plaque. Significant atherosclerotic disease as well along the iliac vessels with some areas of stenosis. Please correlate with any particular symptomatology. No specific abnormal lymph node enlargement identified in the abdomen and pelvis. Reproductive: Status post hysterectomy. No adnexal masses. Other: No abdominal or pelvic wall hernia.  No clear ascites. Musculoskeletal: Streak artifact related to the spinal fixation hardware in the lumbar region. Diffuse degenerative changes are noted. Trace retrolisthesis of L5 on S1 and anterolisthesis of L4 on L5. Osteopenia. There is some sclerosis along the right hemipelvis on image 102, unchanged from previous. Favor a Office manager. If there is concern of osseous metastatic disease bone scan could be considered. Focal lipoma identified anterior to the right hip, unchanged. IMPRESSION: Stable surgical changes along the right hemithorax. Stable thickening along the surgical margin but no new mass lesion, fluid collection or lymph node enlargement. Stable mild ectasia of the inferior abdominal aorta with scattered plaque. Recommend follow-up ultrasound every 5 years. This recommendation follows ACR consensus guidelines: White Paper of the ACR Incidental Findings Committee II on Vascular Findings. J Am Coll Radiol 2013; 10:789-794. Fatty liver infiltration. Sigmoid colon diverticula. Stable mild thickening of the left adrenal gland.  Electronically Signed   By: Jill Side M.D.   On: 07/22/2022 12:34      Assessment/Plan  Primary malignant neoplasm of lung with metastasis to brain [facility])  [name] is clinically stable today.  MRI also demonstrates stable findings compared to prior study.  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  Decadron should decrease to 1mg  daily and discontinue in 2 weeks if able. Will con't systemic therapy with Dr. Julien Nordmann.  We appreciate the opportunity to participate in the care of [name].    We ask that [name] return to clinic in 4 months following next brain MRI, or sooner as needed.  All questions were answered. The patient knows to call the clinic with any problems, questions or concerns. No barriers to learning were detected.  The total time spent in the encounter was 30 minutes and more than 50% was on counseling and review of test results   Ventura Sellers, MD Medical Director of Neuro-Oncology [facility] [place] 07/28/22 9:01 AM

## 2022-07-29 ENCOUNTER — Telehealth: Payer: Self-pay | Admitting: Internal Medicine

## 2022-07-29 NOTE — Telephone Encounter (Signed)
Called patient to notify of new appointments. Phone rang and before voicemail hung up. Mailing reminder.

## 2022-08-10 NOTE — Progress Notes (Signed)
[facility] OFFICE PROGRESS NOTE  [name], MD [place] [address]  DIAGNOSIS: Stage IV non-small cell lung cancer (T3, N0, M1C) adenocarcinoma.  The patient presented with a right upper lobe lung mass and solitary brain metastasis.  The patient was diagnosed in February 2022.   Biomarker Findings Microsatellite status - MS-Stable Tumor Mutational Burden - 8 Muts/Mb Genomic Findings For a complete list of the genes assayed, please refer to the Appendix. KRAS G12V KEAP1 S224F TP53 P151T 7 Disease relevant genes with no reportable alterations: ALK, BRAF, EGFR, ERBB2, MET, RET, ROS1  PDL1 Expression: 90%   PRIOR THERAPY: 1) SRS to the metastatic brain lesion on 08/23/2020 under the care of Dr. Tammi Klippel and craniotomy under the care of Dr. Zada Finders on 08/24/2020. 2) S/p robotic assisted right upper lobectomy with en bloc wedge resection of the right middle lobe and lymph node dissection under the care of Dr. Roxan Hockey on September 24, 2020 3) SRS to the two new subcentimeter metastases under the care of Dr. Tammi Klippel on 11/29/20.  4) 10/14/21: L frontoparietal progression, undergoes LITT at [facility]) 5) SRS to brain metastasis, last dose on 11/18/21   CURRENT THERAPY:  Systemic chemotherapy with carboplatin for AUC of 5, Alimta 500 Mg/M2 and Keytruda 200 mg IV every 3 weeks.  First dose Nov 08, 2020. Status post 29 cycles.  Starting from cycle #5, the patient started maintenance Alimta and Keytruda.      INTERVAL HISTORY: Hannah [name] 72 y.o. female returns to the clinic today for a follow-up visit accompanied by her daughter.  The patient was last seen by myself 3 weeks ago.  The patient is feeling well without any concerning complaints. She did mention her husband has a cold and she has some mild nasal congestion and sore throat the last few days. She denies any unusual fatigue, weakness, fevers, cough, or shortness of breath. Overall she is feeling  good today. She has been using Claritin and flonase for her nasal congestion. She is still eating well.    Back on 05/30/2022, the patient called reporting speech alterations and Dr. Mickeal Skinner recommended the patient start taking 1 mg of Decadron due to her history of metastatic disease to the brain. She started decadron I January. She recently saw Dr. Mickeal Skinner and had a repeat brain MRI which did not show an new findings. Since she had not had any seizures in several years, her keppra was discontinued. She also tapered off her decadron and her last dose was Monday.  Her next brain MRI is scheduled for 11/20/22.   The patient is currently undergoing treatment with Alimta and Keytruda.  She has some skin darkening on her lower extremity/heel which may be secondary to her Alimta. She also mentioned her nails are more brittle. Otherwise, she is tolerating her treatment with Keytruda and Alimta well without any concerning adverse side effects except occasional fatigue. Denies any fever or chills.  He denies any recent night sweats. Denies any shortness of breath. Denies cough. Denies any chest discomfort. Denies hemoptysis. Denies any nausea, vomiting, or diarrhea. She has constipation at baseline.  She is here today for evaluation and repeat blood work before starting cycle #30.     MEDICAL HISTORY: Past Medical History:  Diagnosis Date   Allergy    Anemia    Anxiety    Arthritis    Carpal tunnel syndrome    Constipation    senna C stool softeners help  Depression    Diverticulosis    Dyslipidemia    External hemorrhoids    GERD (gastroesophageal reflux disease)    Heart murmur    mild-moderate AR   Hiatal hernia    Hyperlipidemia    on meds    Hypertension    Internal hemorrhoids    lung ca with brain mets 07/2020   Osteoarthritis    Pre-diabetes    PVD (peripheral vascular disease) (HCC)    moderate carotid disease   RBBB    Smoker    Vocal cord polyps     ALLERGIES:  is allergic to  amlodipine, chantix [varenicline tartrate], clarithromycin, lisinopril, simvastatin, wellbutrin [bupropion hcl], lipitor [atorvastatin], and sertraline.  MEDICATIONS:  Current Outpatient Medications  Medication Sig Dispense Refill   acetaminophen (TYLENOL) 325 MG tablet Take 325 mg by mouth every 6 (six) hours as needed for moderate pain.     ALPRAZolam (XANAX XR) 0.5 MG 24 hr tablet Take 1 tablet by mouth once daily 30 tablet 0   ALPRAZolam (XANAX) 0.5 MG tablet TAKE 1 TABLET BY MOUTH THREE TIMES DAILY AS NEEDED FOR ANXIETY 90 tablet 0   aspirin 81 MG tablet Take 1 tablet (81 mg total) by mouth daily. Restart on 08/31/20 30 tablet    bisacodyl (DULCOLAX) 5 MG EC tablet Take 5 mg by mouth daily as needed for moderate constipation.     carboxymethylcellulose (REFRESH PLUS) 0.5 % SOLN Place 1-2 drops into both eyes 3 (three) times daily as needed (dry eyes).     Cholecalciferol (D3 PO) Take 1 capsule by mouth daily.     citalopram (CELEXA) 10 MG tablet Take 1 tablet by mouth once daily 90 tablet 0   dexamethasone (DECADRON) 1 MG tablet Take 1 tablet (1 mg total) by mouth daily with breakfast. 30 tablet 1   fluticasone (FLONASE) 50 MCG/ACT nasal spray PLACE 2 SPRAYS INTO BOTH NOSTRILS DAILY. (Patient taking differently: Place 2 sprays into both nostrils daily as needed for allergies.) 48 g 1   folic acid (FOLVITE) 1 MG tablet Take 1 tablet by mouth once daily 30 tablet 0   ketoconazole (NIZORAL) 2 % cream Apply 1 Application topically daily. 30 g 1   Lacosamide 100 MG TABS Take 1 tablet (100 mg total) by mouth 2 (two) times daily. 60 tablet 3   lidocaine-prilocaine (EMLA) cream Apply 1 Application topically as needed. 30 g 2   loratadine (CLARITIN) 10 MG tablet Take 10 mg by mouth daily as needed for allergies.     magnesium hydroxide (MILK OF MAGNESIA) 800 MG/5ML suspension Take 30 mLs by mouth daily as needed for constipation.     Menthol, Topical Analgesic, (BIOFREEZE EX) Apply 1 application  topically as needed (pain).     mirtazapine (REMERON) 30 MG tablet Take 30 mg by mouth at bedtime.     olopatadine (PATANOL) 0.1 % ophthalmic solution Place 1 drop into both eyes 2 (two) times daily as needed for allergies.     omeprazole (PRILOSEC) 40 MG capsule TAKE 1 CAPSULE TWICE DAILY BEFORE MEALS (Patient taking differently: Take 40 mg by mouth daily.) 180 capsule 0   polyethylene glycol (MIRALAX / GLYCOLAX) 17 g packet Take 17 g by mouth 2 (two) times daily. 72 each 0   pravastatin (PRAVACHOL) 40 MG tablet TAKE 1 TABLET EVERY EVENING 90 tablet 3   Probiotic Product (PROBIOTIC DAILY PO) Take 1 capsule by mouth daily.     prochlorperazine (COMPAZINE) 10 MG tablet TAKE 1 TABLET BY  MOUTH EVERY 6 HOURS AS NEEDED FOR NAUSEA OR VOMITING 30 tablet 0   simethicone (MYLICON) 80 MG chewable tablet Chew 1 tablet (80 mg total) by mouth 4 (four) times daily as needed for flatulence (Bloating). 30 tablet 0   No current facility-administered medications for this visit.    SURGICAL HISTORY:  Past Surgical History:  Procedure Laterality Date   ABDOMINAL HYSTERECTOMY  7169   APPLICATION OF CRANIAL NAVIGATION N/A 08/24/2020   Procedure: APPLICATION OF CRANIAL NAVIGATION;  Surgeon: Judith Part, MD;  Location: [facility];  Service: Neurosurgery;  Laterality: N/A;   COLONOSCOPY     COLONOSCOPY W/ POLYPECTOMY  2009   CRANIOTOMY Left 08/24/2020   Procedure: Left Craniotomy for Tumor Resection with Brainlab;  Surgeon: Judith Part, MD;  Location: [facility];  Service: Neurosurgery;  Laterality: Left;   HEMORRHOID SURGERY     INTERCOSTAL NERVE BLOCK Right 09/24/2020   Procedure: INTERCOSTAL NERVE BLOCK;  Surgeon: Melrose Nakayama, MD;  Location: [facility];  Service: Thoracic;  Laterality: Right;   IR IMAGING GUIDED PORT INSERTION  05/15/2022   JOINT REPLACEMENT     LYMPH NODE DISSECTION Right 09/24/2020   Procedure: LYMPH NODE DISSECTION;  Surgeon: Melrose Nakayama, MD;  Location: [facility];  Service:  Thoracic;  Laterality: Right;   POLYPECTOMY     right total knee arthroplasty     Dr. Emeterio Reeve 06-04-18   [facility]  08/16/2009   TOTAL KNEE ARTHROPLASTY Left 02/13/2014   Procedure: TOTAL KNEE ARTHROPLASTY;  Surgeon: Alta Corning, MD;  Location: [facility];  Service: Orthopedics;  Laterality: Left;   TOTAL KNEE ARTHROPLASTY Right 06/04/2018   Procedure: RIGHT TOTAL KNEE ARTHROPLASTY;  Surgeon: Dorna Leitz, MD;  Location: [facility];  Service: Orthopedics;  Laterality: Right;  Adductor Block   TUBAL LIGATION      REVIEW OF SYSTEMS:   Constitutional: Positive for fatigue. Negative for appetite change, chills, fever and unexpected weight change.  HENT: Positive for mild nasal congestion and sore throat. Negative for mouth sores, nosebleeds, and trouble swallowing.  Eyes: Negative for eye problems and icterus.  Respiratory: Negative for cough, hemoptysis, shortness of breath and wheezing.   Cardiovascular: Negative for chest pain and leg swelling.  Gastrointestinal: Negative for abdominal pain, constipation, diarrhea, nausea and vomiting.  Genitourinary: Negative for bladder incontinence, difficulty urinating, dysuria, frequency and hematuria.   Musculoskeletal: Negative for back pain, gait problem, neck pain and neck stiffness.  Skin: Positive for skin darkening. Negative for rash and pruritis.  Neurological: Negative for dizziness, extremity weakness, gait problem, headaches, light-headedness and seizures.  Hematological: Negative for adenopathy. Does not bruise/bleed easily.  Psychiatric/Behavioral: Negative for confusion, depression and sleep disturbance. The patient is not nervous/anxious   PHYSICAL EXAMINATION:  Blood pressure (!) 143/75, pulse 74, temperature 98.4 F (36.9 C), temperature source Oral, resp. rate 16, weight 129 lb 8 oz (58.7 kg), SpO2 100 %.  ECOG PERFORMANCE STATUS: 1  Physical Exam  Constitutional: Oriented to person, place, and time and thin appearing female and in no  distress. HENT: Head: Normocephalic and atraumatic. Mouth/Throat: Oropharynx is clear and moist. No oropharyngeal exudate. No thrush.  Eyes: Conjunctivae are normal. Right eye exhibits no discharge. Left eye exhibits no discharge. No scleral icterus. Neck: Normal range of motion. Neck supple. Cardiovascular: Normal rate, regular rhythm, systolic murmur noted and intact distal pulses.   Abdominal: Soft. Bowel sounds are normal. Exhibits no distension and no mass. There is no tenderness.  Musculoskeletal: Swan neck deformity  due to RA. No weakness in hands. No decreased sensation. No swelling or erythema. Normal range of motion. Exhibits no edema.  Lymphadenopathy:    No cervical adenopathy.  Neurological: Alert and oriented to person, place, and time. Exhibits normal muscle tone. Gait normal. Coordination normal.  Skin: Skin is warm and dry. Positive for skin darkening around heel. No swelling, tenderness, or erythema. Not diaphoretic. No erythema. No pallor.  Psychiatric: Mood, memory and judgment normal.  Vitals reviewed.  LABORATORY DATA: Lab Results  Component Value Date   WBC 6.0 08/14/2022   HGB 12.0 08/14/2022   HCT 37.9 08/14/2022   MCV 90.5 08/14/2022   PLT 261 08/14/2022      Chemistry      Component Value Date/Time   NA 138 07/24/2022 1010   K 4.0 07/24/2022 1010   CL 103 07/24/2022 1010   CO2 31 07/24/2022 1010   BUN 18 07/24/2022 1010   CREATININE 0.75 07/24/2022 1010   CREATININE 0.79 03/14/2020 1016      Component Value Date/Time   CALCIUM 9.7 07/24/2022 1010   ALKPHOS 77 07/24/2022 1010   AST 18 07/24/2022 1010   ALT 17 07/24/2022 1010   BILITOT 0.2 (L) 07/24/2022 1010       RADIOGRAPHIC STUDIES:  MR BRAIN W WO CONTRAST  Result Date: 07/26/2022 CLINICAL DATA:  72 year old female with metastatic lung cancer. History of SRS in February 2022 two a solitary left frontal metastasis followed by resection. SRS treatment of 2 new lesions June of 2022. LITT  therapy for progression/recurrence at the left parietal treatment area in April 2023. And subsequent post treatment SRS to that area in May 2023. Recurrent enhancement of a 3 mm previously treated right temporal lesion in October 2023. Subsequent encounter. EXAM: MRI HEAD WITHOUT AND WITH CONTRAST TECHNIQUE: Multiplanar, multiecho pulse sequences of the brain and surrounding structures were obtained without and with intravenous contrast. CONTRAST:  6 mL Vueway COMPARISON:  Brain MRI 04/25/2022 and earlier. FINDINGS: Brain: Left posterior percutaneous treatment site which appears to be lateral left occipital lobe on series 14, image 22 appears stable in size and configuration with a 1.8 cm area of mixed enhancement and T1 intrinsic chronic blood products. Regional FLAIR hyperintensity here is stable. No significant mass effect. Left superior frontal gyrus treatment area, prior resection appears stable on series 14, image 138 with mild stellate residual enhancement. Regional FLAIR hyperintensity is stable. Stable regional hemosiderin. No significant regional mass effect. Stable small triangular focus of posterior right temporal lobe enhancement on series 14, image 76. Minimal T2 and FLAIR hyperintensity there appears stable without mass effect. No new areas of abnormal intracranial enhancement identified. No suspicious dural thickening. No superimposed restricted diffusion to suggest acute infarction. No midline shift, mass effect, ventriculomegaly, or acute intracranial hemorrhage. Cervicomedullary junction and pituitary are within normal limits. Stable gray and white matter signal elsewhere. No new signal abnormality identified. Vascular: Major intracranial vascular flow voids are stable. The major dural venous sinuses appear to be enhancing on series 15, patent. Skull and upper cervical spine: Chronic postoperative changes to the left skull in 2 places. Visible bone marrow signal is stable. Negative for age visible  cervical spine. Negative visible spinal cord. Sinuses/Orbits: Stable. Other: Mastoid air cells remain well aerated. Visible internal auditory structures appear normal. Chronic submandibular gland ductal ectasia. Stable scalp soft tissues. IMPRESSION: Stable post treatment appearance of the brain since 04/25/2022. No new or progressive lesions. Electronically Signed   By: Herminio Heads.D.  On: 07/26/2022 13:36   CT Abdomen Pelvis W Contrast  Result Date: 07/22/2022 CLINICAL DATA:  Non-small-cell lung cancer EXAM: CT CHEST, ABDOMEN, AND PELVIS WITH CONTRAST TECHNIQUE: Multidetector CT imaging of the chest, abdomen and pelvis was performed following the standard protocol during bolus administration of intravenous contrast. RADIATION DOSE REDUCTION: This exam was performed according to the departmental dose-optimization program which includes automated exposure control, adjustment of the mA and/or kV according to patient size and/or use of iterative reconstruction technique. CONTRAST:  120mL ISOVUE-300 IOPAMIDOL (ISOVUE-300) INJECTION 61% COMPARISON:  CT 04/29/2022 and older. Chest x-ray 05/28/2022 and older FINDINGS: CT CHEST FINDINGS Cardiovascular: Right upper chest port. Heart is nonenlarged. No significant pericardial fluid. The thoracic aorta has some scattered partially calcified atherosclerotic plaque. Some of the areas of plaque are regular. Coronary artery calcifications are seen. Normal caliber main pulmonary arteries. Mediastinum/Nodes: Small heterogeneous thyroid gland, unchanged. No specific abnormal lymph node enlargement present in the axillary regions, hilum or mediastinum. Persistent mild wall thickening diffusely along the thoracic esophagus. Please correlate with any symptoms. Lungs/Pleura: No consolidation, pneumothorax or effusion. Slight breathing motion identified. Stable 3 mm nodule left lower lobe on series 4, image 105. Mild left lower lobe bronchiectasis. There are some paraseptal changes  medially in the left upper lobe. Stable 3 mm left lower lobe nodule on image 70 of series 4. This measured 2 mm previously but the differences could relate to slice misregistration and breathing motion. Stable surgical changes in the anterior right upper lung with volume loss. There is some persistent thickening with some calcifications along the margin of the fissure on image 52 which could be postsurgical. Recommend simple continued surveillance. Elevated right hemidiaphragm related to the volume loss. No new dominant right lung nodule. Musculoskeletal: Curvature and degenerative changes seen along the spine. CT ABDOMEN PELVIS FINDINGS Hepatobiliary: Fatty liver infiltration identified. No space-occupying liver lesion. The gallbladder is nondilated. Patent portal vein. Question gallstones. Pancreas: Preserved pancreatic parenchyma without obvious enhancing mass. Stable mild ectasia of the pancreatic duct. Spleen: Spleen is nonenlarged. Adrenals/Urinary Tract: Adrenal glands are preserved. Stable mild thickening of the left. Mild bilateral renal atrophy no collecting system dilatation. Ureters have normal course and caliber down to the bladder. Preserved contours of the urinary bladder. Stomach/Bowel: Large bowel is of normal course and caliber with scattered stool. Normal retrocecal appendix. There is some contrast and debris in the distended stomach. Slight atypical distribution of small bowel. The distal duodenal and proximal jejunal extends in the right mid mesentery. Slight swirling of mesenteric vessels in the pelvis but appearance is unchanged and no small bowel dilatation. Vascular/Lymphatic: Normal caliber IVC. Minimal ectasia of the infrarenal abdominal aorta with diameter approaching 2.9 cm. Scattered plaque. Significant atherosclerotic disease as well along the iliac vessels with some areas of stenosis. Please correlate with any particular symptomatology. No specific abnormal lymph node enlargement  identified in the abdomen and pelvis. Reproductive: Status post hysterectomy. No adnexal masses. Other: No abdominal or pelvic wall hernia.  No clear ascites. Musculoskeletal: Streak artifact related to the spinal fixation hardware in the lumbar region. Diffuse degenerative changes are noted. Trace retrolisthesis of L5 on S1 and anterolisthesis of L4 on L5. Osteopenia. There is some sclerosis along the right hemipelvis on image 102, unchanged from previous. Favor a Office manager. If there is concern of osseous metastatic disease bone scan could be considered. Focal lipoma identified anterior to the right hip, unchanged. IMPRESSION: Stable surgical changes along the right hemithorax. Stable thickening along the surgical margin  but no new mass lesion, fluid collection or lymph node enlargement. Stable mild ectasia of the inferior abdominal aorta with scattered plaque. Recommend follow-up ultrasound every 5 years. This recommendation follows ACR consensus guidelines: White Paper of the ACR Incidental Findings Committee II on Vascular Findings. J Am Coll Radiol 2013; 10:789-794. Fatty liver infiltration. Sigmoid colon diverticula. Stable mild thickening of the left adrenal gland. Electronically Signed   By: Jill Side M.D.   On: 07/22/2022 12:34   CT Chest W Contrast  Result Date: 07/22/2022 CLINICAL DATA:  Non-small-cell lung cancer EXAM: CT CHEST, ABDOMEN, AND PELVIS WITH CONTRAST TECHNIQUE: Multidetector CT imaging of the chest, abdomen and pelvis was performed following the standard protocol during bolus administration of intravenous contrast. RADIATION DOSE REDUCTION: This exam was performed according to the departmental dose-optimization program which includes automated exposure control, adjustment of the mA and/or kV according to patient size and/or use of iterative reconstruction technique. CONTRAST:  19mL ISOVUE-300 IOPAMIDOL (ISOVUE-300) INJECTION 61% COMPARISON:  CT 04/29/2022 and older. Chest x-ray  05/28/2022 and older FINDINGS: CT CHEST FINDINGS Cardiovascular: Right upper chest port. Heart is nonenlarged. No significant pericardial fluid. The thoracic aorta has some scattered partially calcified atherosclerotic plaque. Some of the areas of plaque are regular. Coronary artery calcifications are seen. Normal caliber main pulmonary arteries. Mediastinum/Nodes: Small heterogeneous thyroid gland, unchanged. No specific abnormal lymph node enlargement present in the axillary regions, hilum or mediastinum. Persistent mild wall thickening diffusely along the thoracic esophagus. Please correlate with any symptoms. Lungs/Pleura: No consolidation, pneumothorax or effusion. Slight breathing motion identified. Stable 3 mm nodule left lower lobe on series 4, image 105. Mild left lower lobe bronchiectasis. There are some paraseptal changes medially in the left upper lobe. Stable 3 mm left lower lobe nodule on image 70 of series 4. This measured 2 mm previously but the differences could relate to slice misregistration and breathing motion. Stable surgical changes in the anterior right upper lung with volume loss. There is some persistent thickening with some calcifications along the margin of the fissure on image 52 which could be postsurgical. Recommend simple continued surveillance. Elevated right hemidiaphragm related to the volume loss. No new dominant right lung nodule. Musculoskeletal: Curvature and degenerative changes seen along the spine. CT ABDOMEN PELVIS FINDINGS Hepatobiliary: Fatty liver infiltration identified. No space-occupying liver lesion. The gallbladder is nondilated. Patent portal vein. Question gallstones. Pancreas: Preserved pancreatic parenchyma without obvious enhancing mass. Stable mild ectasia of the pancreatic duct. Spleen: Spleen is nonenlarged. Adrenals/Urinary Tract: Adrenal glands are preserved. Stable mild thickening of the left. Mild bilateral renal atrophy no collecting system dilatation.  Ureters have normal course and caliber down to the bladder. Preserved contours of the urinary bladder. Stomach/Bowel: Large bowel is of normal course and caliber with scattered stool. Normal retrocecal appendix. There is some contrast and debris in the distended stomach. Slight atypical distribution of small bowel. The distal duodenal and proximal jejunal extends in the right mid mesentery. Slight swirling of mesenteric vessels in the pelvis but appearance is unchanged and no small bowel dilatation. Vascular/Lymphatic: Normal caliber IVC. Minimal ectasia of the infrarenal abdominal aorta with diameter approaching 2.9 cm. Scattered plaque. Significant atherosclerotic disease as well along the iliac vessels with some areas of stenosis. Please correlate with any particular symptomatology. No specific abnormal lymph node enlargement identified in the abdomen and pelvis. Reproductive: Status post hysterectomy. No adnexal masses. Other: No abdominal or pelvic wall hernia.  No clear ascites. Musculoskeletal: Streak artifact related to the spinal fixation hardware  in the lumbar region. Diffuse degenerative changes are noted. Trace retrolisthesis of L5 on S1 and anterolisthesis of L4 on L5. Osteopenia. There is some sclerosis along the right hemipelvis on image 102, unchanged from previous. Favor a Office manager. If there is concern of osseous metastatic disease bone scan could be considered. Focal lipoma identified anterior to the right hip, unchanged. IMPRESSION: Stable surgical changes along the right hemithorax. Stable thickening along the surgical margin but no new mass lesion, fluid collection or lymph node enlargement. Stable mild ectasia of the inferior abdominal aorta with scattered plaque. Recommend follow-up ultrasound every 5 years. This recommendation follows ACR consensus guidelines: White Paper of the ACR Incidental Findings Committee II on Vascular Findings. J Am Coll Radiol 2013; 10:789-794. Fatty liver  infiltration. Sigmoid colon diverticula. Stable mild thickening of the left adrenal gland. Electronically Signed   By: Jill Side M.D.   On: 07/22/2022 12:34     ASSESSMENT/PLAN:  This is a very pleasant 72 year old African-American female diagnosed with stage IV (T3, N0, M1 B) non-small cell lung cancer, adenocarcinoma.  She presented with a right upper lobe lung mass with a solitary brain metastasis.  She was diagnosed in February 2022.  Her PD-L1 expression is 90%.  She does not have any actionable mutations.   The patient completed SRS followed by craniotomy and resection on 08/23/2020 under the care of Dr. Tammi Klippel and craniotomy under the care of Dr. Zada Finders on 08/24/2020.   She then had a robot-assisted right upper lobectomy with en bloc wedge resection of the right middle lobe and lymph node dissection under the care of Dr. Roxan Hockey on September 24, 2020.   She underwent SRS to the two new subcentimeter brain metastases on 11/29/20.   The patient is currently undergoing systemic chemotherapy with carboplatin for an AUC of 5, Alimta 500 mg per metered squared, Keytruda 200 mg IV every 3 weeks.  She is status post 29 cycles and tolerated it well without any adverse side effects.  Starting from cycle #5, the patient started maintenance Alimta and Keytruda.    She recently had progessive metastatic disease to the brain and is status post SRS which was completed on 11/18/21.  She also had the LITT brain procedure Dr. Brett Albino at [facility] in April 2023.    Labs were reviewed. Her CMP is pending. As long as this is in parameters, she is ok to proceed with treatment as scheduled. Recommend that she proceed with cycle #30 today as scheduled. She has a mild sore throat and nasal congestion. No leukocytosis on labs. Her vitals are WNL. She is non-toxic appearing and appears well today. Gave the patient the option of proceeding with treatment vs proceeding with single agent immunotherapy. She feels well to proceed  with both her [place] and alimta today. Of course, we discussed should she develop new or worsening symptoms such as cough, shortness of breath, fatigue, weakness, fevers, chills, worsening sore throat, etc to call to be re-evaluated.    We will see her back for follow-up visit in 3 weeks for evaluation repeat blood work before starting cycle #31   She will continue to follow with neuro-oncology.   The patient was advised to call immediately if she has any concerning symptoms in the interval. The patient voices understanding of current disease status and treatment options and is in agreement with the current care plan. All questions were answered. The patient knows to call the clinic with any problems, questions or concerns. We can certainly  see the patient much sooner if necessary    No orders of the defined types were placed in this encounter.    The total time spent in the appointment was 20-29 minutes.   Tyleek Smick L Stanly Si, PA-C 08/14/22

## 2022-08-14 ENCOUNTER — Inpatient Hospital Stay: Payer: Medicare HMO

## 2022-08-14 ENCOUNTER — Other Ambulatory Visit: Payer: Self-pay

## 2022-08-14 ENCOUNTER — Inpatient Hospital Stay: Payer: Medicare HMO | Attending: Internal Medicine | Admitting: Physician Assistant

## 2022-08-14 ENCOUNTER — Other Ambulatory Visit: Payer: Medicare HMO

## 2022-08-14 VITALS — BP 143/75 | HR 74 | Temp 98.4°F | Resp 16 | Wt 129.5 lb

## 2022-08-14 DIAGNOSIS — M4317 Spondylolisthesis, lumbosacral region: Secondary | ICD-10-CM | POA: Diagnosis not present

## 2022-08-14 DIAGNOSIS — Z90710 Acquired absence of both cervix and uterus: Secondary | ICD-10-CM | POA: Insufficient documentation

## 2022-08-14 DIAGNOSIS — Z5112 Encounter for antineoplastic immunotherapy: Secondary | ICD-10-CM | POA: Diagnosis not present

## 2022-08-14 DIAGNOSIS — C7931 Secondary malignant neoplasm of brain: Secondary | ICD-10-CM | POA: Insufficient documentation

## 2022-08-14 DIAGNOSIS — F32A Depression, unspecified: Secondary | ICD-10-CM | POA: Insufficient documentation

## 2022-08-14 DIAGNOSIS — K5730 Diverticulosis of large intestine without perforation or abscess without bleeding: Secondary | ICD-10-CM | POA: Insufficient documentation

## 2022-08-14 DIAGNOSIS — Z95828 Presence of other vascular implants and grafts: Secondary | ICD-10-CM | POA: Insufficient documentation

## 2022-08-14 DIAGNOSIS — C3411 Malignant neoplasm of upper lobe, right bronchus or lung: Secondary | ICD-10-CM

## 2022-08-14 DIAGNOSIS — F419 Anxiety disorder, unspecified: Secondary | ICD-10-CM | POA: Diagnosis not present

## 2022-08-14 DIAGNOSIS — M4316 Spondylolisthesis, lumbar region: Secondary | ICD-10-CM | POA: Diagnosis not present

## 2022-08-14 DIAGNOSIS — Z8719 Personal history of other diseases of the digestive system: Secondary | ICD-10-CM | POA: Insufficient documentation

## 2022-08-14 DIAGNOSIS — K219 Gastro-esophageal reflux disease without esophagitis: Secondary | ICD-10-CM | POA: Insufficient documentation

## 2022-08-14 DIAGNOSIS — C3490 Malignant neoplasm of unspecified part of unspecified bronchus or lung: Secondary | ICD-10-CM

## 2022-08-14 DIAGNOSIS — K5900 Constipation, unspecified: Secondary | ICD-10-CM | POA: Insufficient documentation

## 2022-08-14 DIAGNOSIS — Z87891 Personal history of nicotine dependence: Secondary | ICD-10-CM | POA: Insufficient documentation

## 2022-08-14 DIAGNOSIS — K760 Fatty (change of) liver, not elsewhere classified: Secondary | ICD-10-CM | POA: Diagnosis not present

## 2022-08-14 DIAGNOSIS — Z5111 Encounter for antineoplastic chemotherapy: Secondary | ICD-10-CM | POA: Insufficient documentation

## 2022-08-14 DIAGNOSIS — Z888 Allergy status to other drugs, medicaments and biological substances status: Secondary | ICD-10-CM | POA: Insufficient documentation

## 2022-08-14 DIAGNOSIS — M8580 Other specified disorders of bone density and structure, unspecified site: Secondary | ICD-10-CM | POA: Insufficient documentation

## 2022-08-14 DIAGNOSIS — Z79899 Other long term (current) drug therapy: Secondary | ICD-10-CM | POA: Diagnosis not present

## 2022-08-14 DIAGNOSIS — R0981 Nasal congestion: Secondary | ICD-10-CM | POA: Diagnosis not present

## 2022-08-14 DIAGNOSIS — I739 Peripheral vascular disease, unspecified: Secondary | ICD-10-CM | POA: Diagnosis not present

## 2022-08-14 DIAGNOSIS — J479 Bronchiectasis, uncomplicated: Secondary | ICD-10-CM | POA: Insufficient documentation

## 2022-08-14 DIAGNOSIS — C3491 Malignant neoplasm of unspecified part of right bronchus or lung: Secondary | ICD-10-CM

## 2022-08-14 DIAGNOSIS — E785 Hyperlipidemia, unspecified: Secondary | ICD-10-CM | POA: Diagnosis not present

## 2022-08-14 LAB — TSH: TSH: 1.164 u[IU]/mL (ref 0.350–4.500)

## 2022-08-14 LAB — CMP (CANCER CENTER ONLY)
ALT: 25 U/L (ref 0–44)
AST: 29 U/L (ref 15–41)
Albumin: 3.6 g/dL (ref 3.5–5.0)
Alkaline Phosphatase: 59 U/L (ref 38–126)
Anion gap: 11 (ref 5–15)
BUN: 15 mg/dL (ref 8–23)
CO2: 27 mmol/L (ref 22–32)
Calcium: 9.3 mg/dL (ref 8.9–10.3)
Chloride: 102 mmol/L (ref 98–111)
Creatinine: 0.84 mg/dL (ref 0.44–1.00)
GFR, Estimated: >60 mL/min (ref >60)
Glucose, Bld: 102 mg/dL — ABNORMAL HIGH (ref 70–99)
Potassium: 4 mmol/L (ref 3.5–5.1)
Sodium: 140 mmol/L (ref 135–145)
Total Bilirubin: 0.6 mg/dL (ref 0.3–1.2)
Total Protein: 6.9 g/dL (ref 6.5–8.1)

## 2022-08-14 LAB — CBC WITH DIFFERENTIAL (CANCER CENTER ONLY)
Abs Immature Granulocytes: 0.05 10*3/uL (ref 0.00–0.07)
Basophils Absolute: 0 10*3/uL (ref 0.0–0.1)
Basophils Relative: 1 %
Eosinophils Absolute: 0 10*3/uL (ref 0.0–0.5)
Eosinophils Relative: 1 %
HCT: 37.9 % (ref 36.0–46.0)
Hemoglobin: 12 g/dL (ref 12.0–15.0)
Immature Granulocytes: 1 %
Lymphocytes Relative: 27 %
Lymphs Abs: 1.6 10*3/uL (ref 0.7–4.0)
MCH: 28.6 pg (ref 26.0–34.0)
MCHC: 31.7 g/dL (ref 30.0–36.0)
MCV: 90.5 fL (ref 80.0–100.0)
Monocytes Absolute: 0.8 10*3/uL (ref 0.1–1.0)
Monocytes Relative: 13 %
Neutro Abs: 3.4 10*3/uL (ref 1.7–7.7)
Neutrophils Relative %: 57 %
Platelet Count: 261 10*3/uL (ref 150–400)
RBC: 4.19 MIL/uL (ref 3.87–5.11)
RDW: 14.6 % (ref 11.5–15.5)
WBC Count: 6 10*3/uL (ref 4.0–10.5)
nRBC: 0 % (ref 0.0–0.2)

## 2022-08-14 MED ORDER — PROCHLORPERAZINE MALEATE 10 MG PO TABS
10.0000 mg | ORAL_TABLET | Freq: Once | ORAL | Status: AC
  Administered 2022-08-14: 10 mg via ORAL
  Filled 2022-08-14: qty 1

## 2022-08-14 MED ORDER — SODIUM CHLORIDE 0.9 % IV SOLN
200.0000 mg | Freq: Once | INTRAVENOUS | Status: AC
  Administered 2022-08-14: 200 mg via INTRAVENOUS
  Filled 2022-08-14: qty 8

## 2022-08-14 MED ORDER — CYANOCOBALAMIN 1000 MCG/ML IJ SOLN
1000.0000 ug | Freq: Once | INTRAMUSCULAR | Status: AC
  Administered 2022-08-14: 1000 ug via INTRAMUSCULAR
  Filled 2022-08-14: qty 1

## 2022-08-14 MED ORDER — SODIUM CHLORIDE 0.9 % IV SOLN: Freq: Once | INTRAVENOUS | Status: AC

## 2022-08-14 MED ORDER — SODIUM CHLORIDE 0.9% FLUSH
10.0000 mL | Freq: Once | INTRAVENOUS | Status: AC
  Administered 2022-08-14: 10 mL

## 2022-08-14 MED ORDER — SODIUM CHLORIDE 0.9 % IV SOLN
500.0000 mg/m2 | Freq: Once | INTRAVENOUS | Status: AC
  Administered 2022-08-14: 800 mg via INTRAVENOUS
  Filled 2022-08-14: qty 20

## 2022-08-14 MED ORDER — HEPARIN SOD (PORK) LOCK FLUSH 100 UNIT/ML IV SOLN
500.0000 [IU] | Freq: Once | INTRAVENOUS | Status: AC | PRN
  Administered 2022-08-14: 500 [IU]

## 2022-08-14 MED ORDER — SODIUM CHLORIDE 0.9% FLUSH
10.0000 mL | INTRAVENOUS | Status: DC | PRN
  Administered 2022-08-14: 10 mL

## 2022-08-14 NOTE — Progress Notes (Signed)
Patient seen by PA today  Vitals are within treatment parameters.  Labs reviewed: and are within treatment parameters.  Per physician team, patient is ready for treatment and there are NO modifications to the treatment plan.

## 2022-08-14 NOTE — Progress Notes (Signed)
Ok to proceed with tx before CMP resulted per Cassie Heilingoeter PAC.

## 2022-08-15 ENCOUNTER — Other Ambulatory Visit: Payer: Self-pay | Admitting: Internal Medicine

## 2022-08-15 ENCOUNTER — Telehealth: Payer: Self-pay

## 2022-08-15 LAB — T4: T4, Total: 6.7 ug/dL (ref 4.5–12.0)

## 2022-08-15 NOTE — Telephone Encounter (Signed)
This nurse spoke with patients daughter, [name], who stated that patient was tapered off her liquid Keppra and patient just had a mild seizure.  The paramedics were called to the home and assessed her.  The patient is alert and vital signs are stable. Patient denied wanting to be transported to the hospital.  Daughter would like to know if patient should resume taking 82ml twice daily of the Keppra.  If so would need a prescription submitted to [facility] on [address].  This nurse sent request to provider for recommendation.

## 2022-08-19 ENCOUNTER — Encounter: Payer: Self-pay | Admitting: Internal Medicine

## 2022-09-01 ENCOUNTER — Encounter: Payer: Self-pay | Admitting: Internal Medicine

## 2022-09-01 ENCOUNTER — Other Ambulatory Visit: Payer: Self-pay | Admitting: Internal Medicine

## 2022-09-01 ENCOUNTER — Ambulatory Visit (INDEPENDENT_AMBULATORY_CARE_PROVIDER_SITE_OTHER): Payer: Medicare HMO | Admitting: Internal Medicine

## 2022-09-01 VITALS — BP 120/72 | HR 70 | Temp 98.2°F | Ht 60.0 in | Wt 129.0 lb

## 2022-09-01 DIAGNOSIS — R350 Frequency of micturition: Secondary | ICD-10-CM

## 2022-09-01 DIAGNOSIS — R0981 Nasal congestion: Secondary | ICD-10-CM | POA: Diagnosis not present

## 2022-09-01 LAB — POCT URINALYSIS DIPSTICK
Bilirubin, UA: NEGATIVE
Blood, UA: NEGATIVE
Glucose, UA: NEGATIVE
Ketones, UA: NEGATIVE
Leukocytes, UA: NEGATIVE
Nitrite, UA: NEGATIVE
Protein, UA: NEGATIVE
Spec Grav, UA: 1.015 (ref 1.010–1.025)
Urobilinogen, UA: 0.2 E.U./dL
pH, UA: 6 (ref 5.0–8.0)

## 2022-09-01 LAB — POCT INFLUENZA A/B
Influenza A, POC: NEGATIVE
Influenza B, POC: NEGATIVE

## 2022-09-01 LAB — POC COVID19 BINAXNOW: SARS Coronavirus 2 Ag: NEGATIVE

## 2022-09-01 NOTE — Assessment & Plan Note (Signed)
Acute States urine frequency and dysuria that started today.  She has a history of UTIs.  She does have some confusion, which is not new.  She is unsure if it is worse than usual. Urine dip here today negative for infection Will send to the lab for UA and urine culture to make sure there is no infection

## 2022-09-01 NOTE — Patient Instructions (Addendum)
     Your urine does not look infected.  We will send it for a culture.     Your flu and covid tests are negative    Medications changes include :   none     Return if symptoms worsen or fail to improve.

## 2022-09-01 NOTE — Progress Notes (Signed)
Subjective:    Patient ID: Hannah [name] Hannah [name], female    DOB: 05/29/1951, 72 y.o.   MRN: [id number]      HPI Hannah [name] is here for  Chief Complaint  Patient presents with   Urinary Tract Infection    Frequency started yesterday , also having some confusion     She is urinating more often and she felt a little burning that started today.  She is a little confused - which is not new.  She is not sure if it is worse.   She states some nasal congestion that started today.  She denies any fevers, sinus pain, sore throat, cough, wheeze or shortness of breath.  She was concerned about a possible UTI.   Medications and allergies reviewed with patient and updated if appropriate.  Current Outpatient Medications on File Prior to Visit  Medication Sig Dispense Refill   acetaminophen (TYLENOL) 325 MG tablet Take 325 mg by mouth every 6 (six) hours as needed for moderate pain.     ALPRAZolam (XANAX XR) 0.5 MG 24 hr tablet Take 1 tablet by mouth once daily 30 tablet 0   ALPRAZolam (XANAX) 0.5 MG tablet TAKE 1 TABLET BY MOUTH THREE TIMES DAILY AS NEEDED FOR ANXIETY 90 tablet 0   aspirin 81 MG tablet Take 1 tablet (81 mg total) by mouth daily. Restart on 08/31/20 30 tablet    bisacodyl (DULCOLAX) 5 MG EC tablet Take 5 mg by mouth daily as needed for moderate constipation.     carboxymethylcellulose (REFRESH PLUS) 0.5 % SOLN Place 1-2 drops into both eyes 3 (three) times daily as needed (dry eyes).     Cholecalciferol (D3 PO) Take 1 capsule by mouth daily.     citalopram (CELEXA) 10 MG tablet Take 1 tablet by mouth once daily 90 tablet 0   dexamethasone (DECADRON) 1 MG tablet Take 1 tablet (1 mg total) by mouth daily with breakfast. 30 tablet 1   fluticasone (FLONASE) 50 MCG/ACT nasal spray PLACE 2 SPRAYS INTO BOTH NOSTRILS DAILY. (Patient taking differently: Place 2 sprays into both nostrils daily as needed for allergies.) 48 g 1   folic acid (FOLVITE) 1 MG tablet Take 1 tablet by mouth once daily 30  tablet 0   ketoconazole (NIZORAL) 2 % cream Apply 1 Application topically daily. 30 g 1   Lacosamide 100 MG TABS Take 1 tablet (100 mg total) by mouth 2 (two) times daily. 60 tablet 3   levETIRAcetam (KEPPRA) 100 MG/ML solution TAKE 15 ML BY MOUTH  TWICE DAILY 473 mL 0   lidocaine-prilocaine (EMLA) cream Apply 1 Application topically as needed. 30 g 2   loratadine (CLARITIN) 10 MG tablet Take 10 mg by mouth daily as needed for allergies.     magnesium hydroxide (MILK OF MAGNESIA) 800 MG/5ML suspension Take 30 mLs by mouth daily as needed for constipation.     Menthol, Topical Analgesic, (BIOFREEZE EX) Apply 1 application topically as needed (pain).     mirtazapine (REMERON) 30 MG tablet Take 30 mg by mouth at bedtime.     olopatadine (PATANOL) 0.1 % ophthalmic solution Place 1 drop into both eyes 2 (two) times daily as needed for allergies.     omeprazole (PRILOSEC) 40 MG capsule TAKE 1 CAPSULE TWICE DAILY BEFORE MEALS (Patient taking differently: Take 40 mg by mouth daily.) 180 capsule 0   polyethylene glycol (MIRALAX / GLYCOLAX) 17 g packet Take 17 g by mouth 2 (two) times daily. 72 each 0  pravastatin (PRAVACHOL) 40 MG tablet TAKE 1 TABLET EVERY EVENING 90 tablet 3   Probiotic Product (PROBIOTIC DAILY PO) Take 1 capsule by mouth daily.     prochlorperazine (COMPAZINE) 10 MG tablet TAKE 1 TABLET BY MOUTH EVERY 6 HOURS AS NEEDED FOR NAUSEA OR VOMITING 30 tablet 0   simethicone (MYLICON) 80 MG chewable tablet Chew 1 tablet (80 mg total) by mouth 4 (four) times daily as needed for flatulence (Bloating). 30 tablet 0   No current facility-administered medications on file prior to visit.    Review of Systems  Constitutional:  Negative for fever.  HENT:  Positive for congestion. Negative for ear pain, sinus pressure and sore throat.   Respiratory:  Negative for cough, shortness of breath and wheezing.   Cardiovascular:  Negative for chest pain, palpitations and leg swelling.  Gastrointestinal:   Positive for constipation (not new). Negative for abdominal pain, diarrhea and nausea.  Genitourinary:  Positive for dysuria and frequency. Negative for hematuria.       No cloudy urine  Musculoskeletal:  Positive for myalgias (started today).  Neurological:  Negative for headaches.  Psychiatric/Behavioral:  Positive for confusion.        Objective:   Vitals:   09/01/22 1504  BP: 120/72  Pulse: 70  Temp: 98.2 F (36.8 C)  SpO2: 100%   BP Readings from Last 3 Encounters:  09/01/22 120/72  08/14/22 (!) 143/75  07/28/22 112/67   Wt Readings from Last 3 Encounters:  09/01/22 129 lb (58.5 kg)  08/14/22 129 lb 8 oz (58.7 kg)  07/28/22 122 lb 1.6 oz (55.4 kg)   Body mass index is 25.19 kg/m.    Physical Exam Constitutional:      General: She is not in acute distress.    Appearance: Normal appearance. She is not ill-appearing.  HENT:     Head: Normocephalic and atraumatic.     Right Ear: Tympanic membrane, ear canal and external ear normal.     Left Ear: Tympanic membrane, ear canal and external ear normal.     Mouth/Throat:     Mouth: Mucous membranes are moist.     Pharynx: No oropharyngeal exudate or posterior oropharyngeal erythema.  Eyes:     Conjunctiva/sclera: Conjunctivae normal.  Cardiovascular:     Rate and Rhythm: Normal rate and regular rhythm.  Pulmonary:     Effort: Pulmonary effort is normal. No respiratory distress.     Breath sounds: Normal breath sounds. No wheezing or rales.  Abdominal:     General: There is no distension.     Palpations: Abdomen is soft.     Tenderness: There is no abdominal tenderness.  Musculoskeletal:     Cervical back: Neck supple. No tenderness.     Right lower leg: No edema.     Left lower leg: No edema.  Lymphadenopathy:     Cervical: No cervical adenopathy.  Skin:    General: Skin is warm and dry.  Neurological:     Mental Status: She is alert.            Assessment & Plan:    See Problem List for  Assessment and Plan of chronic medical problems.

## 2022-09-01 NOTE — Assessment & Plan Note (Signed)
Acute Started today States nasal congestion ?  Coming down with URI Given medical history and chemotherapy scheduled for this Thursday will check for COVID and flu Both COVID and flu test are negative Symptomatic treatment She will call if symptoms worsen

## 2022-09-02 LAB — URINALYSIS
Bilirubin Urine: NEGATIVE
Hgb urine dipstick: NEGATIVE
Ketones, ur: NEGATIVE
Leukocytes,Ua: NEGATIVE
Nitrite: NEGATIVE
Specific Gravity, Urine: 1.015 (ref 1.000–1.030)
Total Protein, Urine: NEGATIVE
Urine Glucose: NEGATIVE
Urobilinogen, UA: 0.2 (ref 0.0–1.0)
pH: 6.5 (ref 5.0–8.0)

## 2022-09-02 MED ORDER — ALPRAZOLAM ER 0.5 MG PO TB24: 0.5000 mg | ORAL_TABLET | Freq: Every day | ORAL | 0 refills | Status: DC

## 2022-09-03 ENCOUNTER — Telehealth: Payer: Self-pay | Admitting: Internal Medicine

## 2022-09-03 LAB — CULTURE, URINE COMPREHENSIVE

## 2022-09-03 MED ORDER — CEPHALEXIN 500 MG PO CAPS: 500.0000 mg | ORAL_CAPSULE | Freq: Two times a day (BID) | ORAL | 0 refills | Status: DC

## 2022-09-03 NOTE — Telephone Encounter (Signed)
Please call her and see how she is feeling.  Her urine culture comes back with a small amount of urine that may be a contaminant from external skin, but if she is still not feeling well it could also possibly be an infection and we may want to treat her-let me know

## 2022-09-04 ENCOUNTER — Other Ambulatory Visit: Payer: Self-pay

## 2022-09-04 ENCOUNTER — Inpatient Hospital Stay: Payer: Medicare HMO

## 2022-09-04 ENCOUNTER — Ambulatory Visit: Payer: Medicare HMO

## 2022-09-04 ENCOUNTER — Ambulatory Visit: Payer: Medicare HMO | Admitting: Internal Medicine

## 2022-09-04 ENCOUNTER — Other Ambulatory Visit: Payer: Medicare HMO

## 2022-09-04 ENCOUNTER — Other Ambulatory Visit: Payer: Self-pay | Admitting: Internal Medicine

## 2022-09-04 ENCOUNTER — Inpatient Hospital Stay: Payer: Medicare HMO | Attending: Internal Medicine | Admitting: Internal Medicine

## 2022-09-04 VITALS — BP 145/71 | HR 87 | Temp 98.0°F | Resp 16 | Wt 128.4 lb

## 2022-09-04 DIAGNOSIS — Z8719 Personal history of other diseases of the digestive system: Secondary | ICD-10-CM | POA: Insufficient documentation

## 2022-09-04 DIAGNOSIS — E785 Hyperlipidemia, unspecified: Secondary | ICD-10-CM | POA: Insufficient documentation

## 2022-09-04 DIAGNOSIS — C3411 Malignant neoplasm of upper lobe, right bronchus or lung: Secondary | ICD-10-CM

## 2022-09-04 DIAGNOSIS — K5900 Constipation, unspecified: Secondary | ICD-10-CM | POA: Diagnosis not present

## 2022-09-04 DIAGNOSIS — Z79899 Other long term (current) drug therapy: Secondary | ICD-10-CM | POA: Diagnosis not present

## 2022-09-04 DIAGNOSIS — K219 Gastro-esophageal reflux disease without esophagitis: Secondary | ICD-10-CM | POA: Insufficient documentation

## 2022-09-04 DIAGNOSIS — Z79631 Long term (current) use of antimetabolite agent: Secondary | ICD-10-CM | POA: Diagnosis not present

## 2022-09-04 DIAGNOSIS — Z95828 Presence of other vascular implants and grafts: Secondary | ICD-10-CM

## 2022-09-04 DIAGNOSIS — R5383 Other fatigue: Secondary | ICD-10-CM | POA: Insufficient documentation

## 2022-09-04 DIAGNOSIS — C3491 Malignant neoplasm of unspecified part of right bronchus or lung: Secondary | ICD-10-CM

## 2022-09-04 DIAGNOSIS — Z79620 Long term (current) use of immunosuppressive biologic: Secondary | ICD-10-CM | POA: Diagnosis not present

## 2022-09-04 DIAGNOSIS — F419 Anxiety disorder, unspecified: Secondary | ICD-10-CM | POA: Diagnosis not present

## 2022-09-04 DIAGNOSIS — Z87891 Personal history of nicotine dependence: Secondary | ICD-10-CM | POA: Insufficient documentation

## 2022-09-04 DIAGNOSIS — I739 Peripheral vascular disease, unspecified: Secondary | ICD-10-CM | POA: Insufficient documentation

## 2022-09-04 DIAGNOSIS — Z5112 Encounter for antineoplastic immunotherapy: Secondary | ICD-10-CM | POA: Diagnosis not present

## 2022-09-04 DIAGNOSIS — F32A Depression, unspecified: Secondary | ICD-10-CM | POA: Insufficient documentation

## 2022-09-04 DIAGNOSIS — Z5111 Encounter for antineoplastic chemotherapy: Secondary | ICD-10-CM | POA: Diagnosis not present

## 2022-09-04 DIAGNOSIS — Z888 Allergy status to other drugs, medicaments and biological substances status: Secondary | ICD-10-CM | POA: Insufficient documentation

## 2022-09-04 DIAGNOSIS — C7931 Secondary malignant neoplasm of brain: Secondary | ICD-10-CM | POA: Insufficient documentation

## 2022-09-04 DIAGNOSIS — Z90710 Acquired absence of both cervix and uterus: Secondary | ICD-10-CM | POA: Diagnosis not present

## 2022-09-04 DIAGNOSIS — C3490 Malignant neoplasm of unspecified part of unspecified bronchus or lung: Secondary | ICD-10-CM

## 2022-09-04 LAB — CMP (CANCER CENTER ONLY)
ALT: 11 U/L (ref 0–44)
AST: 20 U/L (ref 15–41)
Albumin: 3.3 g/dL — ABNORMAL LOW (ref 3.5–5.0)
Alkaline Phosphatase: 56 U/L (ref 38–126)
Anion gap: 5 (ref 5–15)
BUN: 13 mg/dL (ref 8–23)
CO2: 27 mmol/L (ref 22–32)
Calcium: 8.5 mg/dL — ABNORMAL LOW (ref 8.9–10.3)
Chloride: 110 mmol/L (ref 98–111)
Creatinine: 0.63 mg/dL (ref 0.44–1.00)
GFR, Estimated: >60 mL/min (ref >60)
Glucose, Bld: 88 mg/dL (ref 70–99)
Potassium: 3.6 mmol/L (ref 3.5–5.1)
Sodium: 142 mmol/L (ref 135–145)
Total Bilirubin: 0.2 mg/dL — ABNORMAL LOW (ref 0.3–1.2)
Total Protein: 5.8 g/dL — ABNORMAL LOW (ref 6.5–8.1)

## 2022-09-04 LAB — CBC WITH DIFFERENTIAL (CANCER CENTER ONLY)
Abs Immature Granulocytes: 0.03 10*3/uL (ref 0.00–0.07)
Basophils Absolute: 0 10*3/uL (ref 0.0–0.1)
Basophils Relative: 1 %
Eosinophils Absolute: 0 10*3/uL (ref 0.0–0.5)
Eosinophils Relative: 0 %
HCT: 35.7 % — ABNORMAL LOW (ref 36.0–46.0)
Hemoglobin: 11.6 g/dL — ABNORMAL LOW (ref 12.0–15.0)
Immature Granulocytes: 1 %
Lymphocytes Relative: 31 %
Lymphs Abs: 1.6 10*3/uL (ref 0.7–4.0)
MCH: 29.2 pg (ref 26.0–34.0)
MCHC: 32.5 g/dL (ref 30.0–36.0)
MCV: 89.9 fL (ref 80.0–100.0)
Monocytes Absolute: 0.7 10*3/uL (ref 0.1–1.0)
Monocytes Relative: 13 %
Neutro Abs: 2.9 10*3/uL (ref 1.7–7.7)
Neutrophils Relative %: 54 %
Platelet Count: 296 10*3/uL (ref 150–400)
RBC: 3.97 MIL/uL (ref 3.87–5.11)
RDW: 13.7 % (ref 11.5–15.5)
WBC Count: 5.3 10*3/uL (ref 4.0–10.5)
nRBC: 0 % (ref 0.0–0.2)

## 2022-09-04 MED ORDER — SODIUM CHLORIDE 0.9% FLUSH
10.0000 mL | INTRAVENOUS | Status: DC | PRN
  Administered 2022-09-04: 10 mL

## 2022-09-04 MED ORDER — PROCHLORPERAZINE MALEATE 10 MG PO TABS
10.0000 mg | ORAL_TABLET | Freq: Once | ORAL | Status: AC
  Administered 2022-09-04: 10 mg via ORAL
  Filled 2022-09-04: qty 1

## 2022-09-04 MED ORDER — SODIUM CHLORIDE 0.9% FLUSH
10.0000 mL | Freq: Once | INTRAVENOUS | Status: AC
  Administered 2022-09-04: 10 mL

## 2022-09-04 MED ORDER — SODIUM CHLORIDE 0.9 % IV SOLN: Freq: Once | INTRAVENOUS | Status: AC

## 2022-09-04 MED ORDER — SODIUM CHLORIDE 0.9 % IV SOLN
200.0000 mg | Freq: Once | INTRAVENOUS | Status: AC
  Administered 2022-09-04: 200 mg via INTRAVENOUS
  Filled 2022-09-04: qty 8

## 2022-09-04 MED ORDER — HEPARIN SOD (PORK) LOCK FLUSH 100 UNIT/ML IV SOLN
500.0000 [IU] | Freq: Once | INTRAVENOUS | Status: AC | PRN
  Administered 2022-09-04: 500 [IU]

## 2022-09-04 MED ORDER — SODIUM CHLORIDE 0.9 % IV SOLN
500.0000 mg/m2 | Freq: Once | INTRAVENOUS | Status: AC
  Administered 2022-09-04: 800 mg via INTRAVENOUS
  Filled 2022-09-04: qty 20

## 2022-09-04 NOTE — Patient Instructions (Signed)
[facility]  Discharge Instructions: Thank you for choosing [facility] to provide your oncology and hematology care.   If you have a lab appointment with the [facility], please go directly to the [facility] and check in at the registration area.   Wear comfortable clothing and clothing appropriate for easy access to any Portacath or PICC line.   We strive to give you quality time with your provider. You may need to reschedule your appointment if you arrive late (15 or more minutes).  Arriving late affects you and other patients whose appointments are after yours.  Also, if you miss three or more appointments without notifying the office, you may be dismissed from the clinic at the provider's discretion.      For prescription refill requests, have your pharmacy contact our office and allow 72 hours for refills to be completed.    Today you received the following chemotherapy and/or immunotherapy agents keytruda , alimta      To help prevent nausea and vomiting after your treatment, we encourage you to take your nausea medication as directed.  BELOW ARE SYMPTOMS THAT SHOULD BE REPORTED IMMEDIATELY: *FEVER GREATER THAN 100.4 F (38 C) OR HIGHER *CHILLS OR SWEATING *NAUSEA AND VOMITING THAT IS NOT CONTROLLED WITH YOUR NAUSEA MEDICATION *UNUSUAL SHORTNESS OF BREATH *UNUSUAL BRUISING OR BLEEDING *URINARY PROBLEMS (pain or burning when urinating, or frequent urination) *BOWEL PROBLEMS (unusual diarrhea, constipation, pain near the anus) TENDERNESS IN MOUTH AND THROAT WITH OR WITHOUT PRESENCE OF ULCERS (sore throat, sores in mouth, or a toothache) UNUSUAL RASH, SWELLING OR PAIN  UNUSUAL VAGINAL DISCHARGE OR ITCHING   Items with * indicate a potential emergency and should be followed up as soon as possible or go to the Emergency Department if any problems should occur.  Please show the CHEMOTHERAPY ALERT CARD or IMMUNOTHERAPY ALERT CARD  at check-in to the Emergency Department and triage nurse.  Should you have questions after your visit or need to cancel or reschedule your appointment, please contact [facility]  Dept: [phone]  and follow the prompts.  Office hours are 8:00 a.m. to 4:30 p.m. Monday - Friday. Please note that voicemails left after 4:00 p.m. may not be returned until the following business day.  We are closed weekends and major holidays. You have access to a nurse at all times for urgent questions. Please call the main number to the clinic Dept: [phone] and follow the prompts.   For any non-urgent questions, you may also contact your provider using MyChart. We now offer e-Visits for anyone 33 and older to request care online for non-urgent symptoms. For details visit [email or web address].   Also download the MyChart app! Go to the app store, search "MyChart", open the app, select [facility], and log in with your MyChart username and password.

## 2022-09-04 NOTE — Progress Notes (Signed)
Hannah [name] [phone]   [phone]  OFFICE PROGRESS NOTE  [name], MD [place] [address]  DIAGNOSIS: Stage IV non-small cell lung cancer (T3, N0, M1C) adenocarcinoma.  The patient presented with a and solitary brain metastasis.  The patient was diagnosed in February 2022.  Biomarker Findings Microsatellite status - MS-Stable Tumor Mutational Burden - 8 Muts/Mb Genomic Findings For a complete list of the genes assayed, please refer to the Appendix. KRAS G12V KEAP1 S224F TP53 P151T 7 Disease relevant genes with no reportable alterations: ALK, BRAF, EGFR, ERBB2, MET, RET, ROS1  PDL1 Expression: 50%    PRIOR THERAPY: 1) SRS to the metastatic brain lesion on 08/23/2020 under the care of Dr. Tammi Klippel and craniotomy under the care of Dr. Zada Finders scheduled for 08/24/2020. 2) S/p robotic assisted right upper lobectomy with en bloc wedge resection of the right middle lobe and lymph node dissection under the care of Dr. Roxan Hockey on September 24, 2020. 3) status post SRS to a new subcentimeter brain lesions under the care of Dr. Lisbeth Renshaw. 4) SRS to brain metastasis.   CURRENT THERAPY: Systemic chemotherapy with carboplatin for AUC of 5, Alimta 500 Mg/M2 and Keytruda 200 mg IV every 3 weeks.  First dose Nov 07, 2020.  Status post 30 cycles.  Starting from cycle #5 the patient will be on maintenance treatment with Alimta and Keytruda every 3 weeks.  INTERVAL HISTORY: Hannah [name] 72 y.o. female returns to the clinic today accompanied by her ex-husband.  The patient is feeling fine today with no concerning complaints.  She has been tolerating her treatment with maintenance Alimta and Keytruda fairly well.  She denied having any current nausea, vomiting, diarrhea or constipation.  She has no headache or visual changes.  She denied having any significant weight loss or night sweats.  She is here today for evaluation before starting  cycle #31 of her treatment.    MEDICAL HISTORY: Past Medical History:  Diagnosis Date   Allergy    Anemia    Anxiety    Arthritis    Carpal tunnel syndrome    Constipation    senna C stool softeners help    Depression    Diverticulosis    Dyslipidemia    External hemorrhoids    GERD (gastroesophageal reflux disease)    Heart murmur    mild-moderate AR   Hiatal hernia    Hyperlipidemia    on meds    Hypertension    Internal hemorrhoids    lung ca with brain mets 07/2020   Osteoarthritis    Pre-diabetes    PVD (peripheral vascular disease) (HCC)    moderate carotid disease   RBBB    Smoker    Vocal cord polyps     ALLERGIES:  is allergic to amlodipine, chantix [varenicline tartrate], clarithromycin, lisinopril, simvastatin, wellbutrin [bupropion hcl], lipitor [atorvastatin], and sertraline.  MEDICATIONS:  Current Outpatient Medications  Medication Sig Dispense Refill   cephALEXin (KEFLEX) 500 MG capsule Take 1 capsule (500 mg total) by mouth 2 (two) times daily. 14 capsule 0   acetaminophen (TYLENOL) 325 MG tablet Take 325 mg by mouth every 6 (six) hours as needed for moderate pain.     ALPRAZolam (XANAX XR) 0.5 MG 24 hr tablet Take 1 tablet (0.5 mg total) by mouth daily. 30 tablet 0   ALPRAZolam (XANAX) 0.5 MG tablet TAKE 1 TABLET BY MOUTH THREE TIMES DAILY AS NEEDED FOR ANXIETY  90 tablet 0   aspirin 81 MG tablet Take 1 tablet (81 mg total) by mouth daily. Restart on 08/31/20 30 tablet    bisacodyl (DULCOLAX) 5 MG EC tablet Take 5 mg by mouth daily as needed for moderate constipation.     carboxymethylcellulose (REFRESH PLUS) 0.5 % SOLN Place 1-2 drops into both eyes 3 (three) times daily as needed (dry eyes).     Cholecalciferol (D3 PO) Take 1 capsule by mouth daily.     citalopram (CELEXA) 10 MG tablet Take 1 tablet by mouth once daily 90 tablet 0   dexamethasone (DECADRON) 1 MG tablet Take 1 tablet (1 mg total) by mouth daily with breakfast. 30 tablet 1    fluticasone (FLONASE) 50 MCG/ACT nasal spray PLACE 2 SPRAYS INTO BOTH NOSTRILS DAILY. (Patient taking differently: Place 2 sprays into both nostrils daily as needed for allergies.) 48 g 1   folic acid (FOLVITE) 1 MG tablet Take 1 tablet by mouth once daily 30 tablet 0   ketoconazole (NIZORAL) 2 % cream Apply 1 Application topically daily. 30 g 1   Lacosamide 100 MG TABS Take 1 tablet (100 mg total) by mouth 2 (two) times daily. 60 tablet 3   levETIRAcetam (KEPPRA) 100 MG/ML solution TAKE 15 ML BY MOUTH  TWICE DAILY 473 mL 0   lidocaine-prilocaine (EMLA) cream Apply 1 Application topically as needed. 30 g 2   loratadine (CLARITIN) 10 MG tablet Take 10 mg by mouth daily as needed for allergies.     magnesium hydroxide (MILK OF MAGNESIA) 800 MG/5ML suspension Take 30 mLs by mouth daily as needed for constipation.     Menthol, Topical Analgesic, (BIOFREEZE EX) Apply 1 application topically as needed (pain).     mirtazapine (REMERON) 30 MG tablet Take 30 mg by mouth at bedtime.     olopatadine (PATANOL) 0.1 % ophthalmic solution Place 1 drop into both eyes 2 (two) times daily as needed for allergies.     omeprazole (PRILOSEC) 40 MG capsule TAKE 1 CAPSULE TWICE DAILY BEFORE MEALS (Patient taking differently: Take 40 mg by mouth daily.) 180 capsule 0   polyethylene glycol (MIRALAX / GLYCOLAX) 17 g packet Take 17 g by mouth 2 (two) times daily. 72 each 0   pravastatin (PRAVACHOL) 40 MG tablet TAKE 1 TABLET EVERY EVENING 90 tablet 3   Probiotic Product (PROBIOTIC DAILY PO) Take 1 capsule by mouth daily.     prochlorperazine (COMPAZINE) 10 MG tablet TAKE 1 TABLET BY MOUTH EVERY 6 HOURS AS NEEDED FOR NAUSEA OR VOMITING 30 tablet 0   simethicone (MYLICON) 80 MG chewable tablet Chew 1 tablet (80 mg total) by mouth 4 (four) times daily as needed for flatulence (Bloating). 30 tablet 0   No current facility-administered medications for this visit.    SURGICAL HISTORY:  Past Surgical History:  Procedure  Laterality Date   ABDOMINAL HYSTERECTOMY  [id number]   APPLICATION OF CRANIAL NAVIGATION N/A 08/24/2020   Procedure: APPLICATION OF CRANIAL NAVIGATION;  Surgeon: Judith Part, MD;  Location: [facility];  Service: Neurosurgery;  Laterality: N/A;   COLONOSCOPY     COLONOSCOPY W/ POLYPECTOMY  2009   CRANIOTOMY Left 08/24/2020   Procedure: Left Craniotomy for Tumor Resection with Brainlab;  Surgeon: Judith Part, MD;  Location: [facility];  Service: Neurosurgery;  Laterality: Left;   HEMORRHOID SURGERY     INTERCOSTAL NERVE BLOCK Right 09/24/2020   Procedure: INTERCOSTAL NERVE BLOCK;  Surgeon: Melrose Nakayama, MD;  Location: [facility];  Service:  Thoracic;  Laterality: Right;   IR IMAGING GUIDED PORT INSERTION  05/15/2022   JOINT REPLACEMENT     LYMPH NODE DISSECTION Right 09/24/2020   Procedure: LYMPH NODE DISSECTION;  Surgeon: Melrose Nakayama, MD;  Location: [facility];  Service: Thoracic;  Laterality: Right;   POLYPECTOMY     right total knee arthroplasty     Dr. Emeterio Reeve 06-04-18   [facility]  08/16/2009   TOTAL KNEE ARTHROPLASTY Left 02/13/2014   Procedure: TOTAL KNEE ARTHROPLASTY;  Surgeon: Alta Corning, MD;  Location: [facility];  Service: Orthopedics;  Laterality: Left;   TOTAL KNEE ARTHROPLASTY Right 06/04/2018   Procedure: RIGHT TOTAL KNEE ARTHROPLASTY;  Surgeon: Dorna Leitz, MD;  Location: [facility];  Service: Orthopedics;  Laterality: Right;  Adductor Block   TUBAL LIGATION      REVIEW OF SYSTEMS:  A comprehensive review of systems was negative.   PHYSICAL EXAMINATION: General appearance: alert, cooperative, and no distress Head: Normocephalic, without obvious abnormality, atraumatic Neck: no adenopathy, no JVD, supple, symmetrical, trachea midline, and thyroid not enlarged, symmetric, no tenderness/mass/nodules Lymph nodes: Cervical, supraclavicular, and axillary nodes normal. Resp: clear to auscultation bilaterally Back: symmetric, no curvature. ROM normal. No CVA tenderness. Cardio:  regular rate and rhythm, S1, S2 normal, no murmur, click, rub or gallop GI: soft, non-tender; bowel sounds normal; no masses,  no organomegaly Extremities: extremities normal, atraumatic, no cyanosis or edema  ECOG PERFORMANCE STATUS: 1 - Symptomatic but completely ambulatory  Blood pressure (!) 145/71, pulse 87, temperature 98 F (36.7 C), temperature source Oral, resp. rate 16, weight 128 lb 7 oz (58.3 kg), SpO2 98 %.  LABORATORY DATA: Lab Results  Component Value Date   WBC 5.3 09/04/2022   HGB 11.6 (L) 09/04/2022   HCT 35.7 (L) 09/04/2022   MCV 89.9 09/04/2022   PLT 296 09/04/2022      Chemistry      Component Value Date/Time   NA 140 08/14/2022 0930   K 4.0 08/14/2022 0930   CL 102 08/14/2022 0930   CO2 27 08/14/2022 0930   BUN 15 08/14/2022 0930   CREATININE 0.84 08/14/2022 0930   CREATININE 0.79 03/14/2020 1016      Component Value Date/Time   CALCIUM 9.3 08/14/2022 0930   ALKPHOS 59 08/14/2022 0930   AST 29 08/14/2022 0930   ALT 25 08/14/2022 0930   BILITOT 0.6 08/14/2022 0930       RADIOGRAPHIC STUDIES: No results found.   ASSESSMENT AND PLAN: This is a very pleasant 72 years old African-American female diagnosed with stage IV (T3, N0, M1b) non-small cell lung cancer, adenocarcinoma presented with right upper lobe lung mass with solitary brain metastasis diagnosed in February 2022 status post SRS to the brain lesion followed by craniotomy and resection. The patient also has a solitary lung mass. S/p robotic assisted right upper lobectomy with en bloc wedge resection of the right middle lobe and lymph node dissection under the care of Dr. Roxan Hockey on September 24, 2020. She also underwent SRS treatment to a new subcentimeter brain lesions under the care of Dr. Lisbeth Renshaw She is currently undergoing systemic chemotherapy with carboplatin for AUC of 5, Alimta 500 Mg/M2 and Keytruda 200 Mg IV every 3 weeks status post 30 cycles.  Starting from cycle #5 the patient will  be on maintenance treatment with Alimta and Keytruda every 3 weeks.  The patient has been tolerating this treatment well with no concerning adverse effects. I recommended for her to proceed with cycle #31  today as planned.  I will see her back for follow-up visit in 3 weeks for evaluation before the next cycle of her treatment. For the brain metastasis, she is currently followed by radiation oncology and Dr. Mickeal Skinner. The patient was advised to call immediately if she has any other concerning symptoms in the interval. The patient voices understanding of current disease status and treatment options and is in agreement with the current care plan.  All questions were answered. The patient knows to call the clinic with any problems, questions or concerns. We can certainly see the patient much sooner if necessary.  Disclaimer: This note was dictated with voice recognition software. Similar sounding words can inadvertently be transcribed and may not be corrected upon review.

## 2022-09-06 ENCOUNTER — Encounter: Payer: Self-pay | Admitting: Internal Medicine

## 2022-09-10 ENCOUNTER — Telehealth: Payer: Self-pay | Admitting: *Deleted

## 2022-09-10 ENCOUNTER — Telehealth: Payer: Self-pay | Admitting: Internal Medicine

## 2022-09-10 NOTE — Progress Notes (Signed)
  Care Coordination  Outreach Note  09/10/2022 Name: Hannah [name] MRN: [id number] DOB: Mar 05, 1951   Care Coordination Outreach Attempts: An unsuccessful telephone outreach was attempted today to offer the patient information about available care coordination services as a benefit of their health plan.   Follow Up Plan:  Additional outreach attempts will be made to offer the patient care coordination information and services.   Encounter Outcome:  No Answer  Julian Hy, [place] Direct Dial: [phone]

## 2022-09-10 NOTE — Progress Notes (Unsigned)
Subjective:    Patient ID: Hannah [name], female    DOB: September 03, 1950, 72 y.o.   MRN: [id number]      HPI Hannah [name] is here for No chief complaint on file.    Boil -     Medications and allergies reviewed with patient and updated if appropriate.  Current Outpatient Medications on File Prior to Visit  Medication Sig Dispense Refill   cephALEXin (KEFLEX) 500 MG capsule Take 1 capsule (500 mg total) by mouth 2 (two) times daily. 14 capsule 0   acetaminophen (TYLENOL) 325 MG tablet Take 325 mg by mouth every 6 (six) hours as needed for moderate pain.     ALPRAZolam (XANAX XR) 0.5 MG 24 hr tablet Take 1 tablet (0.5 mg total) by mouth daily. 30 tablet 0   ALPRAZolam (XANAX) 0.5 MG tablet TAKE 1 TABLET BY MOUTH THREE TIMES DAILY AS NEEDED FOR ANXIETY 90 tablet 0   aspirin 81 MG tablet Take 1 tablet (81 mg total) by mouth daily. Restart on 08/31/20 30 tablet    bisacodyl (DULCOLAX) 5 MG EC tablet Take 5 mg by mouth daily as needed for moderate constipation.     carboxymethylcellulose (REFRESH PLUS) 0.5 % SOLN Place 1-2 drops into both eyes 3 (three) times daily as needed (dry eyes).     Cholecalciferol (D3 PO) Take 1 capsule by mouth daily.     citalopram (CELEXA) 10 MG tablet Take 1 tablet by mouth once daily 90 tablet 0   dexamethasone (DECADRON) 1 MG tablet Take 1 tablet (1 mg total) by mouth daily with breakfast. 30 tablet 1   fluticasone (FLONASE) 50 MCG/ACT nasal spray PLACE 2 SPRAYS INTO BOTH NOSTRILS DAILY. (Patient taking differently: Place 2 sprays into both nostrils daily as needed for allergies.) 48 g 1   folic acid (FOLVITE) 1 MG tablet Take 1 tablet by mouth once daily 30 tablet 0   ketoconazole (NIZORAL) 2 % cream Apply 1 Application topically daily. 30 g 1   Lacosamide 100 MG TABS Take 1 tablet (100 mg total) by mouth 2 (two) times daily. 60 tablet 3   levETIRAcetam (KEPPRA) 100 MG/ML solution TAKE 15 ML BY MOUTH  TWICE DAILY 473 mL 0   lidocaine-prilocaine (EMLA) cream Apply  1 Application topically as needed. 30 g 2   loratadine (CLARITIN) 10 MG tablet Take 10 mg by mouth daily as needed for allergies.     magnesium hydroxide (MILK OF MAGNESIA) 800 MG/5ML suspension Take 30 mLs by mouth daily as needed for constipation.     Menthol, Topical Analgesic, (BIOFREEZE EX) Apply 1 application topically as needed (pain).     mirtazapine (REMERON) 30 MG tablet Take 30 mg by mouth at bedtime.     olopatadine (PATANOL) 0.1 % ophthalmic solution Place 1 drop into both eyes 2 (two) times daily as needed for allergies.     omeprazole (PRILOSEC) 40 MG capsule TAKE 1 CAPSULE TWICE DAILY BEFORE MEALS (Patient taking differently: Take 40 mg by mouth daily.) 180 capsule 0   polyethylene glycol (MIRALAX / GLYCOLAX) 17 g packet Take 17 g by mouth 2 (two) times daily. 72 each 0   pravastatin (PRAVACHOL) 40 MG tablet TAKE 1 TABLET EVERY EVENING 90 tablet 3   Probiotic Product (PROBIOTIC DAILY PO) Take 1 capsule by mouth daily.     prochlorperazine (COMPAZINE) 10 MG tablet TAKE 1 TABLET BY MOUTH EVERY 6 HOURS AS NEEDED FOR NAUSEA OR VOMITING 30 tablet 0   simethicone (MYLICON) 80  MG chewable tablet Chew 1 tablet (80 mg total) by mouth 4 (four) times daily as needed for flatulence (Bloating). 30 tablet 0   No current facility-administered medications on file prior to visit.    Review of Systems     Objective:  There were no vitals filed for this visit. BP Readings from Last 3 Encounters:  09/04/22 (!) 145/71  09/01/22 120/72  08/14/22 (!) 143/75   Wt Readings from Last 3 Encounters:  09/04/22 128 lb 7 oz (58.3 kg)  09/01/22 129 lb (58.5 kg)  08/14/22 129 lb 8 oz (58.7 kg)   There is no height or weight on file to calculate BMI.    Physical Exam         Assessment & Plan:    See Problem List for Assessment and Plan of chronic medical problems.

## 2022-09-10 NOTE — Telephone Encounter (Signed)
Patient has a gynecological issue(was unable to go into detail because they were in a public are). They want to know if Dr. Quay Burow would like to see her or if she should get a referral for a gynecologist. They would like a call back at [phone].

## 2022-09-10 NOTE — Telephone Encounter (Signed)
Spoke with patient today.  Boil is located near buttcrack area so appointment made for Thursday to come in and be seen.  Patient notes not drainage but that area is tender and feels like a boil/bump.

## 2022-09-11 ENCOUNTER — Ambulatory Visit (INDEPENDENT_AMBULATORY_CARE_PROVIDER_SITE_OTHER): Payer: Medicare HMO | Admitting: Internal Medicine

## 2022-09-11 ENCOUNTER — Encounter: Payer: Self-pay | Admitting: Internal Medicine

## 2022-09-11 ENCOUNTER — Other Ambulatory Visit: Payer: Self-pay | Admitting: Internal Medicine

## 2022-09-11 VITALS — BP 120/76 | HR 90 | Temp 98.4°F | Ht 60.0 in | Wt 125.0 lb

## 2022-09-11 DIAGNOSIS — L089 Local infection of the skin and subcutaneous tissue, unspecified: Secondary | ICD-10-CM | POA: Insufficient documentation

## 2022-09-11 MED ORDER — AMOXICILLIN-POT CLAVULANATE 400-57 MG/5ML PO SUSR: 800.0000 mg | Freq: Two times a day (BID) | ORAL | 0 refills | Status: AC

## 2022-09-11 NOTE — Assessment & Plan Note (Signed)
Acute One week of possible boil - it looks like it has drained Still tender and concerns for infection Stop cephalexin - start augmentin bid x 5 days for broader coverage Monitor closely - call if no improvement

## 2022-09-11 NOTE — Patient Instructions (Addendum)
      Medications changes include :   Augmentin twice daily x 5 days    Return if symptoms worsen or fail to improve.

## 2022-09-12 ENCOUNTER — Encounter: Payer: Self-pay | Admitting: Internal Medicine

## 2022-09-12 ENCOUNTER — Ambulatory Visit (HOSPITAL_COMMUNITY): Payer: Medicare HMO | Attending: Internal Medicine

## 2022-09-12 DIAGNOSIS — E118 Type 2 diabetes mellitus with unspecified complications: Secondary | ICD-10-CM | POA: Insufficient documentation

## 2022-09-12 DIAGNOSIS — I350 Nonrheumatic aortic (valve) stenosis: Secondary | ICD-10-CM | POA: Diagnosis not present

## 2022-09-12 DIAGNOSIS — I739 Peripheral vascular disease, unspecified: Secondary | ICD-10-CM | POA: Diagnosis not present

## 2022-09-12 DIAGNOSIS — I10 Essential (primary) hypertension: Secondary | ICD-10-CM | POA: Diagnosis not present

## 2022-09-12 DIAGNOSIS — I6522 Occlusion and stenosis of left carotid artery: Secondary | ICD-10-CM | POA: Insufficient documentation

## 2022-09-12 DIAGNOSIS — I4510 Unspecified right bundle-branch block: Secondary | ICD-10-CM | POA: Diagnosis not present

## 2022-09-12 LAB — ECHOCARDIOGRAM COMPLETE
Area-P 1/2: 2.45 cm2
P 1/2 time: 611 msec
S' Lateral: 2.4 cm

## 2022-09-12 NOTE — Progress Notes (Signed)
  Care Coordination   Note   09/12/2022 Name: Hannah [name] MRN: [id number] DOB: October 20, 1950  Hannah [name] is a 72 y.o. year old female who sees Burns, Claudina Lick, MD for primary care. I reached out to Hannah [name] by phone today to offer care coordination services.  Ms. Sitler was given information about Care Coordination services today including:   The Care Coordination services include support from the care team which includes your Nurse Coordinator, Clinical Social Worker, or Pharmacist.  The Care Coordination team is here to help remove barriers to the health concerns and goals most important to you. Care Coordination services are voluntary, and the patient may decline or stop services at any time by request to their care team member.   Care Coordination Consent Status: Patient agreed to services and verbal consent obtained.   Follow up plan:  Telephone appointment with care coordination team member scheduled for:  09/24/2022  Encounter Outcome:  Pt. Scheduled  Julian Hy, [place] Direct Dial: [phone]

## 2022-09-16 ENCOUNTER — Ambulatory Visit: Payer: Medicare HMO | Admitting: Family Medicine

## 2022-09-16 ENCOUNTER — Other Ambulatory Visit: Payer: Self-pay

## 2022-09-16 VITALS — BP 122/72 | HR 78 | Ht 60.0 in | Wt 130.0 lb

## 2022-09-16 DIAGNOSIS — G8929 Other chronic pain: Secondary | ICD-10-CM

## 2022-09-16 DIAGNOSIS — M25512 Pain in left shoulder: Secondary | ICD-10-CM

## 2022-09-16 DIAGNOSIS — M25511 Pain in right shoulder: Secondary | ICD-10-CM | POA: Diagnosis not present

## 2022-09-16 NOTE — Progress Notes (Signed)
Hannah Goltz, PhD, LAT, ATC acting as a scribe for Lynne Leader, MD.  Hannah [name] is a 72 y.o. female who presents to [facility] at [facility] today for cont'd bilat shoulder pain. Pt was last seen by Dr. Georgina Snell on 07/10/22 for bilat shoulder pain and was given bilat subacromial steroid injections. Today, pt c/o bilat shoulder pain returning about a week ago, R>L. Pt notes increased pain w/ shoulder flexion and aBd.  Shoulder injections given about 2 months ago were quite helpful. Lasted about 2 months.   Pertinent review of systems: No fevers or chills.  Relevant historical information: History of lung cancer.   Exam:  BP 122/72   Pulse 78   Ht 5' (1.524 m)   Wt 130 lb (59 kg)   SpO2 97%   BMI 25.39 kg/m  General: Well Developed, well nourished, and in no acute distress.   MSK: Right shoulder: Normal-appearing normal motion pain with abduction.  Left shoulder: Normal-appearing normal motion pain with abduction.    Lab and Radiology Results  Procedure: Real-time Ultrasound Guided Injection of left shoulder glenohumeral joint posterior approach Device: Philips Affiniti 50G Images permanently stored and available for review in PACS Verbal informed consent obtained.  Discussed risks and benefits of procedure. Warned about infection, bleeding, hyperglycemia damage to structures among others. Patient expresses understanding and agreement Time-out conducted.   Noted no overlying erythema, induration, or other signs of local infection.   Skin prepped in a sterile fashion.   Local anesthesia: Topical Ethyl chloride.   With sterile technique and under real time ultrasound guidance: 40 mg of Kenalog and 2 mL Marcaine injected into joint capsule. Fluid seen entering the shoulder joint.   Completed without difficulty   Pain immediately resolved suggesting accurate placement of the medication.   Advised to call if fevers/chills, erythema, induration,  drainage, or persistent bleeding.   Images permanently stored and available for review in the ultrasound unit.  Impression: Technically successful ultrasound guided injection.    Procedure: Real-time Ultrasound Guided Injection of right shoulder glenohumeral joint posterior approach Device: Philips Affiniti 50G Images permanently stored and available for review in PACS Verbal informed consent obtained.  Discussed risks and benefits of procedure. Warned about infection, bleeding, hyperglycemia damage to structures among others. Patient expresses understanding and agreement Time-out conducted.   Noted no overlying erythema, induration, or other signs of local infection.   Skin prepped in a sterile fashion.   Local anesthesia: Topical Ethyl chloride.   With sterile technique and under real time ultrasound guidance: 40 mg of Kenalog and 2 mL Marcaine injected into shoulder joint. Fluid seen entering the joint capsule.   Completed without difficulty   Pain immediately resolved suggesting accurate placement of the medication.   Advised to call if fevers/chills, erythema, induration, drainage, or persistent bleeding.   Images permanently stored and available for review in the ultrasound unit.  Impression: Technically successful ultrasound guided injection.         Assessment and Plan: 72 y.o. female with bilateral shoulder pain.  Etiology is somewhat unclear.  She has evidence of shoulder impingement and some DJD.  She had good improvement immediately following subacromial injection about 2 months ago but the shots did not last.  Will try glenohumeral injections today.  Recommend schedule an appointment on or after April 11 which would be the 67-month mark from her subacromial injections.  If she is feeling better she can cancel the appointment but if today shots do  not work very well we can be ready to go with those repeat subacromial injections.   PDMP not reviewed this encounter. Orders  Placed This Encounter  Procedures   [place] LIMITED JOINT SPACE STRUCTURES UP BILAT(NO LINKED CHARGES)    Order Specific Question:   Reason for Exam (SYMPTOM  OR DIAGNOSIS REQUIRED)    Answer:   bilat shoulder pain    Order Specific Question:   Preferred imaging location?    Answer:   [facility]   No orders of the defined types were placed in this encounter.    Discussed warning signs or symptoms. Please see discharge instructions. Patient expresses understanding.   The above documentation has been reviewed and is accurate and complete Lynne Leader, M.D.

## 2022-09-16 NOTE — Patient Instructions (Addendum)
Thank you for coming in today.   You received an injection today. Seek immediate medical attention if the joint becomes red, extremely painful, or is oozing fluid.  Check back on or after April 11th

## 2022-09-18 ENCOUNTER — Encounter: Payer: Self-pay | Admitting: Internal Medicine

## 2022-09-18 NOTE — Progress Notes (Signed)
Subjective:    Patient ID: Hannah [name], female    DOB: 25-Sep-1950, 72 y.o.   MRN: [id number]     HPI [place] is here for follow up of her chronic medical problems.    Medications and allergies reviewed with patient and updated if appropriate.  Current Outpatient Medications on File Prior to Visit  Medication Sig Dispense Refill   acetaminophen (TYLENOL) 325 MG tablet Take 325 mg by mouth every 6 (six) hours as needed for moderate pain.     ALPRAZolam (XANAX XR) 0.5 MG 24 hr tablet Take 1 tablet (0.5 mg total) by mouth daily. 30 tablet 0   ALPRAZolam (XANAX) 0.5 MG tablet TAKE 1 TABLET BY MOUTH THREE TIMES DAILY AS NEEDED FOR ANXIETY 90 tablet 0   aspirin 81 MG tablet Take 1 tablet (81 mg total) by mouth daily. Restart on 08/31/20 30 tablet    bisacodyl (DULCOLAX) 5 MG EC tablet Take 5 mg by mouth daily as needed for moderate constipation.     carboxymethylcellulose (REFRESH PLUS) 0.5 % SOLN Place 1-2 drops into both eyes 3 (three) times daily as needed (dry eyes).     Cholecalciferol (D3 PO) Take 1 capsule by mouth daily.     citalopram (CELEXA) 10 MG tablet Take 1 tablet by mouth once daily 90 tablet 0   dexamethasone (DECADRON) 1 MG tablet Take 1 tablet (1 mg total) by mouth daily with breakfast. 30 tablet 1   fluticasone (FLONASE) 50 MCG/ACT nasal spray PLACE 2 SPRAYS INTO BOTH NOSTRILS DAILY. (Patient taking differently: Place 2 sprays into both nostrils daily as needed for allergies.) 48 g 1   folic acid (FOLVITE) 1 MG tablet Take 1 tablet by mouth once daily 30 tablet 0   ketoconazole (NIZORAL) 2 % cream Apply 1 Application topically daily. 30 g 1   Lacosamide 100 MG TABS Take 1 tablet by mouth twice daily 60 tablet 0   levETIRAcetam (KEPPRA) 100 MG/ML solution TAKE 15 ML BY MOUTH  TWICE DAILY 473 mL 0   lidocaine-prilocaine (EMLA) cream Apply 1 Application topically as needed. 30 g 2   loratadine (CLARITIN) 10 MG tablet Take 10 mg by mouth daily as needed for allergies.      magnesium hydroxide (MILK OF MAGNESIA) 800 MG/5ML suspension Take 30 mLs by mouth daily as needed for constipation.     Menthol, Topical Analgesic, (BIOFREEZE EX) Apply 1 application topically as needed (pain).     mirtazapine (REMERON) 30 MG tablet Take 30 mg by mouth at bedtime.     olopatadine (PATANOL) 0.1 % ophthalmic solution Place 1 drop into both eyes 2 (two) times daily as needed for allergies.     omeprazole (PRILOSEC) 40 MG capsule TAKE 1 CAPSULE TWICE DAILY BEFORE MEALS (Patient taking differently: Take 40 mg by mouth daily.) 180 capsule 0   polyethylene glycol (MIRALAX / GLYCOLAX) 17 g packet Take 17 g by mouth 2 (two) times daily. 72 each 0   pravastatin (PRAVACHOL) 40 MG tablet TAKE 1 TABLET EVERY EVENING 90 tablet 3   Probiotic Product (PROBIOTIC DAILY PO) Take 1 capsule by mouth daily.     prochlorperazine (COMPAZINE) 10 MG tablet TAKE 1 TABLET BY MOUTH EVERY 6 HOURS AS NEEDED FOR NAUSEA OR VOMITING 30 tablet 0   simethicone (MYLICON) 80 MG chewable tablet Chew 1 tablet (80 mg total) by mouth 4 (four) times daily as needed for flatulence (Bloating). 30 tablet 0   No current facility-administered medications on  file prior to visit.     Review of Systems  Constitutional:  Negative for fever.  Respiratory:  Negative for cough, shortness of breath and wheezing.   Cardiovascular:  Negative for chest pain, palpitations and leg swelling.  Neurological:  Negative for light-headedness and headaches.       Objective:  There were no vitals filed for this visit. BP Readings from Last 3 Encounters:  09/16/22 122/72  09/11/22 120/76  09/04/22 (!) 145/71   Wt Readings from Last 3 Encounters:  09/16/22 130 lb (59 kg)  09/11/22 125 lb (56.7 kg)  09/04/22 128 lb 7 oz (58.3 kg)   There is no height or weight on file to calculate BMI.    Physical Exam     Lab Results  Component Value Date   WBC 5.3 09/04/2022   HGB 11.6 (L) 09/04/2022   HCT 35.7 (L) 09/04/2022   PLT 296  09/04/2022   GLUCOSE 88 09/04/2022   CHOL 185 03/21/2022   TRIG 212.0 (H) 03/21/2022   HDL 52.30 03/21/2022   LDLDIRECT 89.0 03/21/2022   LDLCALC 62 03/14/2020   ALT 11 09/04/2022   AST 20 09/04/2022   NA 142 09/04/2022   K 3.6 09/04/2022   CL 110 09/04/2022   CREATININE 0.63 09/04/2022   BUN 13 09/04/2022   CO2 27 09/04/2022   TSH 1.164 08/14/2022   INR 1.0 02/12/2021   HGBA1C 6.4 03/21/2022   MICROALBUR <0.7 03/21/2022     Assessment & Plan:    See Problem List for Assessment and Plan of chronic medical problems.

## 2022-09-18 NOTE — Patient Instructions (Addendum)
      Your A1c was checked today.     Medications changes include :   none     Return in about 6 months (around 03/22/2023) for follow up.

## 2022-09-19 ENCOUNTER — Ambulatory Visit (INDEPENDENT_AMBULATORY_CARE_PROVIDER_SITE_OTHER): Payer: Medicare HMO | Admitting: Internal Medicine

## 2022-09-19 VITALS — BP 124/70 | HR 60 | Temp 98.0°F | Ht 60.0 in | Wt 127.6 lb

## 2022-09-19 DIAGNOSIS — F3289 Other specified depressive episodes: Secondary | ICD-10-CM | POA: Diagnosis not present

## 2022-09-19 DIAGNOSIS — K219 Gastro-esophageal reflux disease without esophagitis: Secondary | ICD-10-CM

## 2022-09-19 DIAGNOSIS — E119 Type 2 diabetes mellitus without complications: Secondary | ICD-10-CM | POA: Diagnosis not present

## 2022-09-19 DIAGNOSIS — L089 Local infection of the skin and subcutaneous tissue, unspecified: Secondary | ICD-10-CM | POA: Diagnosis not present

## 2022-09-19 DIAGNOSIS — F419 Anxiety disorder, unspecified: Secondary | ICD-10-CM | POA: Diagnosis not present

## 2022-09-19 DIAGNOSIS — G479 Sleep disorder, unspecified: Secondary | ICD-10-CM | POA: Diagnosis not present

## 2022-09-19 DIAGNOSIS — E782 Mixed hyperlipidemia: Secondary | ICD-10-CM | POA: Diagnosis not present

## 2022-09-19 LAB — POCT GLYCOSYLATED HEMOGLOBIN (HGB A1C)
HbA1c POC (<> result, manual entry): 6.2 % (ref 4.0–5.6)
HbA1c, POC (controlled diabetic range): 6.2 % (ref 0.0–7.0)
HbA1c, POC (prediabetic range): 6.2 % (ref 5.7–6.4)
Hemoglobin A1C: 6.2 % — AB (ref 4.0–5.6)

## 2022-09-19 MED ORDER — ALPRAZOLAM ER 0.5 MG PO TB24: 0.5000 mg | ORAL_TABLET | Freq: Every morning | ORAL | 5 refills | Status: DC

## 2022-09-19 MED ORDER — OMEPRAZOLE 40 MG PO CPDR: 40.0000 mg | DELAYED_RELEASE_CAPSULE | Freq: Every day | ORAL | 1 refills | Status: DC

## 2022-09-19 MED ORDER — ALPRAZOLAM 0.5 MG PO TABS: 0.5000 mg | ORAL_TABLET | Freq: Three times a day (TID) | ORAL | 0 refills | Status: DC | PRN

## 2022-09-19 MED ORDER — CITALOPRAM HYDROBROMIDE 10 MG PO TABS: 10.0000 mg | ORAL_TABLET | Freq: Every day | ORAL | 0 refills | Status: DC

## 2022-09-19 NOTE — Assessment & Plan Note (Signed)
She was here last week for probable boil-buttock region that had drained Treated with Augmentin for 5 days Resolved

## 2022-09-19 NOTE — Assessment & Plan Note (Addendum)
Chronic   Lab Results  Component Value Date   HGBA1C 6.4 03/21/2022   Sugars well controlled Check A1c today is 6.2% Continue lifestyle control Stressed diabetic diet

## 2022-09-19 NOTE — Assessment & Plan Note (Addendum)
Chronic Sleeping well most nights Continue taking Remeron 30 mg nightly

## 2022-09-19 NOTE — Assessment & Plan Note (Addendum)
Chronic Controlled, Stable No longer following with psychiatry - current regimen effective and other regimens have not been effective Continue Celexa 10 mg daily, alprazolam xr 0.5 mg daily in am, alprazolam 0.5 mg daily as needed only, remeron 30 mg HS

## 2022-09-19 NOTE — Assessment & Plan Note (Signed)
Chronic GERD controlled Continue omeprazole 40 mg daily 

## 2022-09-19 NOTE — Addendum Note (Signed)
Addended by: Marcina Millard on: 09/19/2022 04:19 PM   Modules accepted: Orders

## 2022-09-19 NOTE — Assessment & Plan Note (Signed)
Chronic Regular exercise and healthy diet encouraged Continue pravastatin 40 mg daily 

## 2022-09-19 NOTE — Assessment & Plan Note (Signed)
Chronic Controlled, Stable Continue Celexa 10 mg daily, mirtazapine 30 mg HS

## 2022-09-22 NOTE — Progress Notes (Unsigned)
[facility] OFFICE PROGRESS NOTE  [name], MD [place] [address]  DIAGNOSIS: Stage IV non-small cell lung cancer (T3, N0, M1C) adenocarcinoma.  The patient presented with a right upper lobe lung mass and solitary brain metastasis.  The patient was diagnosed in February 2022.   Biomarker Findings Microsatellite status - MS-Stable Tumor Mutational Burden - 8 Muts/Mb Genomic Findings For a complete list of the genes assayed, please refer to the Appendix. KRAS G12V KEAP1 S224F TP53 P151T 7 Disease relevant genes with no reportable alterations: ALK, BRAF, EGFR, ERBB2, MET, RET, ROS1   PDL1 Expression: 90%  PRIOR THERAPY: 1) SRS to the metastatic brain lesion on 08/23/2020 under the care of Dr. Tammi Klippel and craniotomy under the care of Dr. Zada Finders on 08/24/2020. 2) S/p robotic assisted right upper lobectomy with en bloc wedge resection of the right middle lobe and lymph node dissection under the care of Dr. Roxan Hockey on September 24, 2020 3) SRS to the two new subcentimeter metastases under the care of Dr. Tammi Klippel on 11/29/20.  4) 10/14/21: L frontoparietal progression, undergoes LITT at [facility]) 5) SRS to brain metastasis, last dose on 11/18/21  CURRENT THERAPY: Systemic chemotherapy with carboplatin for AUC of 5, Alimta 500 Mg/M2 and Keytruda 200 mg IV every 3 weeks.  First dose Nov 08, 2020. Status post 31 cycles.  Starting from cycle #5, the patient started maintenance Alimta and Keytruda.      INTERVAL HISTORY: DENEASE KEENEN 72 y.o. female returns to the clinic today for a follow-up visit accompanied by her husband.  The patient was last seen by myself 3 weeks ago.  The patient is feeling well without any concerning complaints.  The patient is currently undergoing treatment with Alimta and Keytruda.  She is tolerating her treatment with Keytruda and Alimta well without any concerning adverse side effects except occasional fatigue and some skin  darkening. Denies any fever or chills.  He denies any recent night sweats. Denies weight loss. Denies any shortness of breath. Denies cough. Denies any chest discomfort. Denies hemoptysis. Denies any nausea, vomiting, or diarrhea. She has constipation at baseline. She denies headaches or vision changes. Denies rashes or skin changes but she did recently have a boil removed from her back by her PCP. She is here today for evaluation and repeat blood work before starting cycle #32.       MEDICAL HISTORY: Past Medical History:  Diagnosis Date   Allergy    Anemia    Anxiety    Arthritis    Carpal tunnel syndrome    Constipation    senna C stool softeners help    Depression    Diverticulosis    Dyslipidemia    External hemorrhoids    GERD (gastroesophageal reflux disease)    Heart murmur    mild-moderate AR   Hiatal hernia    Hyperlipidemia    on meds    Hypertension    Internal hemorrhoids    lung ca with brain mets 07/2020   Osteoarthritis    Pre-diabetes    PVD (peripheral vascular disease) (HCC)    moderate carotid disease   RBBB    Smoker    Vocal cord polyps     ALLERGIES:  is allergic to amlodipine, chantix [varenicline tartrate], clarithromycin, lisinopril, simvastatin, wellbutrin [bupropion hcl], lipitor [atorvastatin], and sertraline.  MEDICATIONS:  Current Outpatient Medications  Medication Sig Dispense Refill   acetaminophen (TYLENOL) 325 MG tablet Take 325 mg by  mouth every 6 (six) hours as needed for moderate pain.     ALPRAZolam (XANAX XR) 0.5 MG 24 hr tablet Take 1 tablet (0.5 mg total) by mouth in the morning. 30 tablet 5   ALPRAZolam (XANAX) 0.5 MG tablet Take 1 tablet (0.5 mg total) by mouth 3 (three) times daily as needed for anxiety. 90 tablet 0   aspirin 81 MG tablet Take 1 tablet (81 mg total) by mouth daily. Restart on 08/31/20 30 tablet    bisacodyl (DULCOLAX) 5 MG EC tablet Take 5 mg by mouth daily as needed for moderate constipation.      carboxymethylcellulose (REFRESH PLUS) 0.5 % SOLN Place 1-2 drops into both eyes 3 (three) times daily as needed (dry eyes).     Cholecalciferol (D3 PO) Take 1 capsule by mouth daily.     citalopram (CELEXA) 10 MG tablet Take 1 tablet (10 mg total) by mouth daily. 90 tablet 0   dexamethasone (DECADRON) 1 MG tablet Take 1 tablet (1 mg total) by mouth daily with breakfast. 30 tablet 1   fluticasone (FLONASE) 50 MCG/ACT nasal spray PLACE 2 SPRAYS INTO BOTH NOSTRILS DAILY. (Patient taking differently: Place 2 sprays into both nostrils daily as needed for allergies.) 48 g 1   folic acid (FOLVITE) 1 MG tablet Take 1 tablet by mouth once daily 30 tablet 0   ketoconazole (NIZORAL) 2 % cream Apply 1 Application topically daily. 30 g 1   Lacosamide 100 MG TABS Take 1 tablet by mouth twice daily 60 tablet 0   levETIRAcetam (KEPPRA) 100 MG/ML solution TAKE 15 ML BY MOUTH  TWICE DAILY 473 mL 0   lidocaine-prilocaine (EMLA) cream Apply 1 Application topically as needed. 30 g 2   loratadine (CLARITIN) 10 MG tablet Take 10 mg by mouth daily as needed for allergies.     magnesium hydroxide (MILK OF MAGNESIA) 800 MG/5ML suspension Take 30 mLs by mouth daily as needed for constipation.     Menthol, Topical Analgesic, (BIOFREEZE EX) Apply 1 application topically as needed (pain).     mirtazapine (REMERON) 30 MG tablet Take 30 mg by mouth at bedtime.     olopatadine (PATANOL) 0.1 % ophthalmic solution Place 1 drop into both eyes 2 (two) times daily as needed for allergies.     omeprazole (PRILOSEC) 40 MG capsule Take 1 capsule (40 mg total) by mouth daily. 90 capsule 1   polyethylene glycol (MIRALAX / GLYCOLAX) 17 g packet Take 17 g by mouth 2 (two) times daily. 72 each 0   pravastatin (PRAVACHOL) 40 MG tablet TAKE 1 TABLET EVERY EVENING 90 tablet 3   Probiotic Product (PROBIOTIC DAILY PO) Take 1 capsule by mouth daily.     prochlorperazine (COMPAZINE) 10 MG tablet TAKE 1 TABLET BY MOUTH EVERY 6 HOURS AS NEEDED FOR  NAUSEA OR VOMITING 30 tablet 0   simethicone (MYLICON) 80 MG chewable tablet Chew 1 tablet (80 mg total) by mouth 4 (four) times daily as needed for flatulence (Bloating). 30 tablet 0   No current facility-administered medications for this visit.    SURGICAL HISTORY:  Past Surgical History:  Procedure Laterality Date   ABDOMINAL HYSTERECTOMY  [id number]   APPLICATION OF CRANIAL NAVIGATION N/A 08/24/2020   Procedure: APPLICATION OF CRANIAL NAVIGATION;  Surgeon: Judith Part, MD;  Location: [facility];  Service: Neurosurgery;  Laterality: N/A;   COLONOSCOPY     COLONOSCOPY W/ POLYPECTOMY  2009   CRANIOTOMY Left 08/24/2020   Procedure: Left Craniotomy for Tumor Resection  with Brainlab;  Surgeon: Judith Part, MD;  Location: [facility];  Service: Neurosurgery;  Laterality: Left;   HEMORRHOID SURGERY     INTERCOSTAL NERVE BLOCK Right 09/24/2020   Procedure: INTERCOSTAL NERVE BLOCK;  Surgeon: Melrose Nakayama, MD;  Location: [facility];  Service: Thoracic;  Laterality: Right;   IR IMAGING GUIDED PORT INSERTION  05/15/2022   JOINT REPLACEMENT     LYMPH NODE DISSECTION Right 09/24/2020   Procedure: LYMPH NODE DISSECTION;  Surgeon: Melrose Nakayama, MD;  Location: [facility];  Service: Thoracic;  Laterality: Right;   POLYPECTOMY     right total knee arthroplasty     Dr. Emeterio Reeve 06-04-18   [facility]  08/16/2009   TOTAL KNEE ARTHROPLASTY Left 02/13/2014   Procedure: TOTAL KNEE ARTHROPLASTY;  Surgeon: Alta Corning, MD;  Location: [facility];  Service: Orthopedics;  Laterality: Left;   TOTAL KNEE ARTHROPLASTY Right 06/04/2018   Procedure: RIGHT TOTAL KNEE ARTHROPLASTY;  Surgeon: Dorna Leitz, MD;  Location: [facility];  Service: Orthopedics;  Laterality: Right;  Adductor Block   TUBAL LIGATION      REVIEW OF SYSTEMS:   Constitutional: Positive for baseline fatigue. Negative for appetite change, chills, fever and unexpected weight change.  HENT: Negative for mouth sores, nosebleeds, and trouble swallowing.   Eyes: Negative for eye problems and icterus.  Respiratory: Negative for cough, hemoptysis, shortness of breath and wheezing.   Cardiovascular: Negative for chest pain and leg swelling.  Gastrointestinal: Negative for abdominal pain, constipation, diarrhea, nausea and vomiting.  Genitourinary: Negative for bladder incontinence, difficulty urinating, dysuria, frequency and hematuria.   Musculoskeletal: Negative for back pain, gait problem, neck pain and neck stiffness.  Skin: Positive for skin darkening. Negative for rash and pruritis.  Neurological: Negative for dizziness, extremity weakness, gait problem, headaches, light-headedness and seizures.  Hematological: Negative for adenopathy. Does not bruise/bleed easily.  Psychiatric/Behavioral: Negative for confusion, depression and sleep disturbance. The patient is not nervous/anxious   PHYSICAL EXAMINATION:  Blood pressure (!) 142/75, pulse 66, temperature 98.7 F (37.1 C), temperature source Oral, resp. rate 16, weight 130 lb 6.4 oz (59.1 kg), SpO2 99 %.  ECOG PERFORMANCE STATUS: 1  Physical Exam  Constitutional: Oriented to person, place, and time and thin appearing female and in no distress. HENT: Head: Normocephalic and atraumatic. Mouth/Throat: Oropharynx is clear and moist. No oropharyngeal exudate. No thrush.  Eyes: Conjunctivae are normal. Right eye exhibits no discharge. Left eye exhibits no discharge. No scleral icterus. Neck: Normal range of motion. Neck supple. Cardiovascular: Normal rate, regular rhythm, systolic murmur noted and intact distal pulses.   Abdominal: Soft. Bowel sounds are normal. Exhibits no distension and no mass. There is no tenderness.  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 No weakness in hands. No decreased sensation. No swelling or erythema. Normal range of motion. Exhibits no edema.  Lymphadenopathy:    No cervical adenopathy.  Neurological: Alert and oriented to person, place, and time.  Exhibits normal muscle tone. Gait normal. Coordination normal.  Skin: Skin is warm and dry. Positive for skin darkening around heel. No swelling, tenderness, or erythema. Not diaphoretic. No erythema. No pallor.  Psychiatric: Mood, memory and judgment normal.  Vitals reviewed.  LABORATORY DATA: Lab Results  Component Value Date   WBC 5.8 09/25/2022   HGB 11.5 (L) 09/25/2022   HCT 36.0 09/25/2022   MCV 90.7 09/25/2022   PLT 309 09/25/2022      Chemistry      Component Value Date/Time  NA 142 09/04/2022 1102   K 3.6 09/04/2022 1102   CL 110 09/04/2022 1102   CO2 27 09/04/2022 1102   BUN 13 09/04/2022 1102   CREATININE 0.63 09/04/2022 1102   CREATININE 0.79 03/14/2020 1016      Component Value Date/Time   CALCIUM 8.5 (L) 09/04/2022 1102   ALKPHOS 56 09/04/2022 1102   AST 20 09/04/2022 1102   ALT 11 09/04/2022 1102   BILITOT 0.2 (L) 09/04/2022 1102       RADIOGRAPHIC STUDIES:  ECHOCARDIOGRAM COMPLETE  Result Date: 09/12/2022    ECHOCARDIOGRAM REPORT   Patient Name:   AMONEE DORWART Date of Exam: 09/12/2022 Medical Rec #:  [id number]       Height:       60.0 in Accession #:    [id number]      Weight:       125.0 lb Date of Birth:  [birth year]        BSA:          1.529 m Patient Age:    58 years        BP:           126/60 mmHg Patient Gender: F               HR:           73 bpm. Exam Location:  [facility] Procedure: 2D Echo, 3D Echo, Cardiac Doppler and Color Doppler Indications:    I35.9 Aortic valve disease, unspecified  History:        Patient has prior history of Echocardiogram examinations, most                 recent 09/06/2020. Arrythmias:RBBB, Signs/Symptoms:Murmur; Risk                 Factors:Hypertension, Diabetes and Dyslipidemia. PVD. Left                 carotid artery stenosis.  Sonographer:    Diamond Nickel RCS Referring Phys: Minus Breeding IMPRESSIONS  1. Left ventricular ejection fraction, by estimation, is 60 to 65%. The left ventricle has normal function.  The left ventricle has no regional wall motion abnormalities. There is moderate concentric left ventricular hypertrophy. Left ventricular diastolic parameters are consistent with Grade I diastolic dysfunction (impaired relaxation).  2. Right ventricular systolic function is normal. The right ventricular size is normal.  3. No evidence of mitral valve regurgitation. Moderate mitral annular calcification.  4. The aortic valve is tricuspid. Aortic valve regurgitation is mild to moderate.  5. The inferior vena cava is normal in size with greater than 50% respiratory variability, suggesting right atrial pressure of 3 mmHg. Comparison(s): No significant change from prior study. FINDINGS  Left Ventricle: Left ventricular ejection fraction, by estimation, is 60 to 65%. The left ventricle has normal function. The left ventricle has no regional wall motion abnormalities. The left ventricular internal cavity size was normal in size. There is  moderate concentric left ventricular hypertrophy. Left ventricular diastolic parameters are consistent with Grade I diastolic dysfunction (impaired relaxation). Right Ventricle: The right ventricular size is normal. Right ventricular systolic function is normal. Left Atrium: Left atrial size was normal in size. Right Atrium: Right atrial size was normal in size. Pericardium: There is no evidence of pericardial effusion. Mitral Valve: Moderate mitral annular calcification. No evidence of mitral valve regurgitation. Tricuspid Valve: Tricuspid valve regurgitation is mild. Aortic Valve: The aortic valve is tricuspid. Aortic valve regurgitation is mild to  moderate. Aortic regurgitation PHT measures 611 msec. Pulmonic Valve: Pulmonic valve regurgitation is trivial. Aorta: The aortic root and ascending aorta are structurally normal, with no evidence of dilitation. Venous: The inferior vena cava is normal in size with greater than 50% respiratory variability, suggesting right atrial pressure of 3  mmHg. IAS/Shunts: No atrial level shunt detected by color flow Doppler.  LEFT VENTRICLE PLAX 2D LVIDd:         3.80 cm   Diastology LVIDs:         2.40 cm   LV e' medial:    6.16 cm/s LV PW:         1.40 cm   LV E/e' medial:  8.4 LV IVS:        1.20 cm   LV e' lateral:   7.38 cm/s LVOT diam:     2.00 cm   LV E/e' lateral: 7.0 LV SV:         88 LV SV Index:   58 LVOT Area:     3.14 cm                           3D Volume EF:                          3D EF:        61 %                          LV EDV:       114 ml                          LV ESV:       45 ml                          LV SV:        69 ml RIGHT VENTRICLE RV Basal diam:  2.70 cm RV S prime:     10.60 cm/s TAPSE (M-mode): 2.4 cm RVSP:           19.3 mmHg LEFT ATRIUM             Index        RIGHT ATRIUM           Index LA diam:        3.10 cm 2.03 cm/m   RA Pressure: 3.00 mmHg LA Vol (A2C):   54.1 ml 35.39 ml/m  RA Area:     11.90 cm LA Vol (A4C):   24.1 ml 15.77 ml/m  RA Volume:   23.80 ml  15.57 ml/m LA Biplane Vol: 36.3 ml 23.75 ml/m  AORTIC VALVE LVOT Vmax:   143.00 cm/s LVOT Vmean:  89.100 cm/s LVOT VTI:    0.281 m AI PHT:      611 msec  AORTA Ao Root diam: 3.20 cm Ao Asc diam:  3.70 cm MITRAL VALVE                TRICUSPID VALVE MV Area (PHT): 2.45 cm     TR Peak grad:   16.3 mmHg MV Decel Time: 310 msec     TR Vmax:        202.00 cm/s MV E velocity: 51.90 cm/s   Estimated RAP:  3.00 mmHg MV A velocity: 126.00 cm/s  RVSP:  19.3 mmHg MV E/A ratio:  0.41                             SHUNTS                             Systemic VTI:  0.28 m                             Systemic Diam: 2.00 cm Phineas Inches Electronically signed by Phineas Inches Signature Date/Time: 09/12/2022/10:45:15 AM    Final      ASSESSMENT/PLAN:  This is a very pleasant 72 year old African-American female diagnosed with stage IV (T3, N0, M1 B) non-small cell lung cancer, adenocarcinoma.  She presented with a right upper lobe lung mass with a solitary brain metastasis.   She was diagnosed in February 2022.  Her PD-L1 expression is 90%.  She does not have any actionable mutations.   The patient completed SRS followed by craniotomy and resection on 08/23/2020 under the care of Dr. Tammi Klippel and craniotomy under the care of Dr. Zada Finders on 08/24/2020.   She then had a robot-assisted right upper lobectomy with en bloc wedge resection of the right middle lobe and lymph node dissection under the care of Dr. Roxan Hockey on September 24, 2020.   She underwent SRS to the two new subcentimeter brain metastases on 11/29/20.   The patient is currently undergoing systemic chemotherapy with carboplatin for an AUC of 5, Alimta 500 mg per metered squared, Keytruda 200 mg IV every 3 weeks.  She is status post 31 cycles and tolerated it well without any adverse side effects.  Starting from cycle #5, the patient started maintenance Alimta and Keytruda.    She recently had progessive metastatic disease to the brain and is status post SRS which was completed on 11/18/21.  She also had the LITT brain procedure Dr. Brett Albino at [facility] in April 2023   Labs were reviewed. Recommend that she proceed with cycle #32.   I will arrange for a restaging CT scan before her next cycle of treatment.   We will see her back for follow-up visit in 3 weeks for evaluation repeat blood work before starting cycle #33   She will continue to follow with neuro-oncology.   The patient was advised to call immediately if she has any concerning symptoms in the interval. The patient voices understanding of current disease status and treatment options and is in agreement with the current care plan. All questions were answered. The patient knows to call the clinic with any problems, questions or concerns. We can certainly see the patient much sooner if necessary   Orders Placed This Encounter  Procedures   CT CHEST ABDOMEN PELVIS W CONTRAST    Standing Status:   Future    Standing Expiration Date:   09/25/2023    Order  Specific Question:   If indicated for the ordered procedure, I authorize the administration of contrast media per Radiology protocol    Answer:   Yes    Order Specific Question:   Does the patient have a contrast media/X-ray dye allergy?    Answer:   No    Order Specific Question:   Preferred imaging location?    Answer:   [facility]    Order Specific Question:   Is Oral Contrast requested for this exam?  Answer:   Yes, Per Radiology protocol     The total time spent in the appointment was 20-29 minutes.   Cameshia Cressman L Gretel Cantu, PA-C 09/25/22

## 2022-09-24 ENCOUNTER — Ambulatory Visit: Payer: Self-pay

## 2022-09-24 NOTE — Patient Outreach (Signed)
  Care Coordination   Initial Visit Note   09/24/2022 Name: OZIOMA MONELL MRN: [id number] DOB: 1951/05/11  [name] is a 72 y.o. year old female who sees Burns, Claudina Lick, MD for primary care. I spoke with  [name] by phone today.  What matters to the patients health and wellness today?  Patient being able to get medications and safe environment matters to her. And adds she does not have any difficulty obtaining medications, she also states she lives in a safe environment. She expresses Depends are expensive and would like to know if any resources available to assist with obtaining Depends.  Goals Addressed             This Visit's Progress    Community resource needs       Interventions Today    Flowsheet Row Most Recent Value  Chronic Disease   Chronic disease during today's visit Diabetes, Other  [RUL Ca]  General Interventions   General Interventions Discussed/Reviewed General Interventions Discussed, Annual Eye Exam, Annual Foot Exam, Doctor Visits, Communication with, [employer]  Doctor Visits Discussed/Reviewed Doctor Visits Discussed  Communication with --  Engineer, maintenance guide regarding resources for depends, RNCM contacted [employer] [facility] [place]-spoke with Chrystal who reports they have some supplies in at this time. Mrs. Moger notified of contact number to call( [phone])]  Education Interventions   Education Provided Provided Education  Provided Verbal Education On [employer], Other  [discussed care coordination program, Instructed on how to access OTC benefit from insurance company.]  Nutrition Interventions   Nutrition Discussed/Reviewed Nutrition Discussed  [encouraged to continue to eat healthy]  Pharmacy Interventions   Pharmacy Dicussed/Reviewed Pharmacy Topics Discussed            SDOH assessments and interventions completed:  Yes  SDOH Interventions Today    Flowsheet Row Most Recent Value  SDOH Interventions   Food  Insecurity Interventions Intervention Not Indicated  Housing Interventions Intervention Not Indicated  Transportation Interventions Intervention Not Indicated  Utilities Interventions Intervention Not Indicated     Care Coordination Interventions:  Yes, provided   Follow up plan: Follow up call scheduled for 10/21/22    Encounter Outcome:  Pt. Visit Completed   Thea Silversmith, RN, MSN, BSN, [place] Coordinator [phone]

## 2022-09-24 NOTE — Patient Instructions (Addendum)
Visit Information  Thank you for taking time to visit with me today. Please don't hesitate to contact me if I can be of assistance to you.   Following are the goals we discussed today:   Goals Addressed             This Visit's Progress    Community resource needs       Interventions Today    Flowsheet Row Most Recent Value  Chronic Disease   Chronic disease during today's visit Diabetes, Other  [RUL Ca]  General Interventions   General Interventions Discussed/Reviewed General Interventions Discussed, Annual Eye Exam, Annual Foot Exam, Doctor Visits, Communication with, [employer]  Doctor Visits Discussed/Reviewed Doctor Visits Discussed  Communication with --  Engineer, maintenance guide regarding resources for depends, RNCM contacted [employer] [facility] [place]-spoke with Chrystal who reports they have some supplies in at this time. Mrs. Tatsch notified of contact number to call( [phone])]  Education Interventions   Education Provided Provided Education  Provided Verbal Education On [employer], Other  [discussed care coordination program, Instructed on how to access OTC benefit from insurance company.]  Nutrition Interventions   Nutrition Discussed/Reviewed Nutrition Discussed  [encouraged to continue to eat healthy]  Pharmacy Interventions   Pharmacy Dicussed/Reviewed Pharmacy Topics Discussed            Our next appointment is by telephone on 10/21/22 at 10:30 am  Please call the care guide team at [phone] if you need to cancel or reschedule your appointment.   If you are experiencing a Mental Health or [place] or need someone to talk to, please call the Suicide and Crisis Lifeline: [place], RN, MSN, BSN, [place] [phone]

## 2022-09-25 ENCOUNTER — Other Ambulatory Visit: Payer: Self-pay

## 2022-09-25 ENCOUNTER — Inpatient Hospital Stay: Payer: Medicare HMO

## 2022-09-25 ENCOUNTER — Inpatient Hospital Stay: Payer: Medicare HMO | Admitting: Physician Assistant

## 2022-09-25 VITALS — BP 142/75 | HR 66 | Temp 98.7°F | Resp 16 | Wt 130.4 lb

## 2022-09-25 DIAGNOSIS — C3491 Malignant neoplasm of unspecified part of right bronchus or lung: Secondary | ICD-10-CM

## 2022-09-25 DIAGNOSIS — K219 Gastro-esophageal reflux disease without esophagitis: Secondary | ICD-10-CM | POA: Diagnosis not present

## 2022-09-25 DIAGNOSIS — K5900 Constipation, unspecified: Secondary | ICD-10-CM | POA: Diagnosis not present

## 2022-09-25 DIAGNOSIS — R5383 Other fatigue: Secondary | ICD-10-CM | POA: Diagnosis not present

## 2022-09-25 DIAGNOSIS — I739 Peripheral vascular disease, unspecified: Secondary | ICD-10-CM | POA: Diagnosis not present

## 2022-09-25 DIAGNOSIS — C3411 Malignant neoplasm of upper lobe, right bronchus or lung: Secondary | ICD-10-CM

## 2022-09-25 DIAGNOSIS — Z5111 Encounter for antineoplastic chemotherapy: Secondary | ICD-10-CM | POA: Diagnosis not present

## 2022-09-25 DIAGNOSIS — Z5112 Encounter for antineoplastic immunotherapy: Secondary | ICD-10-CM

## 2022-09-25 DIAGNOSIS — F419 Anxiety disorder, unspecified: Secondary | ICD-10-CM | POA: Diagnosis not present

## 2022-09-25 DIAGNOSIS — C7931 Secondary malignant neoplasm of brain: Secondary | ICD-10-CM | POA: Diagnosis not present

## 2022-09-25 DIAGNOSIS — C3490 Malignant neoplasm of unspecified part of unspecified bronchus or lung: Secondary | ICD-10-CM

## 2022-09-25 DIAGNOSIS — Z95828 Presence of other vascular implants and grafts: Secondary | ICD-10-CM

## 2022-09-25 LAB — CMP (CANCER CENTER ONLY)
ALT: 16 U/L (ref 0–44)
AST: 22 U/L (ref 15–41)
Albumin: 3.7 g/dL (ref 3.5–5.0)
Alkaline Phosphatase: 59 U/L (ref 38–126)
Anion gap: 5 (ref 5–15)
BUN: 13 mg/dL (ref 8–23)
CO2: 31 mmol/L (ref 22–32)
Calcium: 9.4 mg/dL (ref 8.9–10.3)
Chloride: 104 mmol/L (ref 98–111)
Creatinine: 0.78 mg/dL (ref 0.44–1.00)
GFR, Estimated: >60 mL/min (ref >60)
Glucose, Bld: 102 mg/dL — ABNORMAL HIGH (ref 70–99)
Potassium: 4 mmol/L (ref 3.5–5.1)
Sodium: 140 mmol/L (ref 135–145)
Total Bilirubin: 0.2 mg/dL — ABNORMAL LOW (ref 0.3–1.2)
Total Protein: 6.6 g/dL (ref 6.5–8.1)

## 2022-09-25 LAB — CBC WITH DIFFERENTIAL (CANCER CENTER ONLY)
Abs Immature Granulocytes: 0.06 10*3/uL (ref 0.00–0.07)
Basophils Absolute: 0 10*3/uL (ref 0.0–0.1)
Basophils Relative: 0 %
Eosinophils Absolute: 0 10*3/uL (ref 0.0–0.5)
Eosinophils Relative: 0 %
HCT: 36 % (ref 36.0–46.0)
Hemoglobin: 11.5 g/dL — ABNORMAL LOW (ref 12.0–15.0)
Immature Granulocytes: 1 %
Lymphocytes Relative: 30 %
Lymphs Abs: 1.7 10*3/uL (ref 0.7–4.0)
MCH: 29 pg (ref 26.0–34.0)
MCHC: 31.9 g/dL (ref 30.0–36.0)
MCV: 90.7 fL (ref 80.0–100.0)
Monocytes Absolute: 0.8 10*3/uL (ref 0.1–1.0)
Monocytes Relative: 13 %
Neutro Abs: 3.2 10*3/uL (ref 1.7–7.7)
Neutrophils Relative %: 56 %
Platelet Count: 309 10*3/uL (ref 150–400)
RBC: 3.97 MIL/uL (ref 3.87–5.11)
RDW: 13.5 % (ref 11.5–15.5)
WBC Count: 5.8 10*3/uL (ref 4.0–10.5)
nRBC: 0 % (ref 0.0–0.2)

## 2022-09-25 MED ORDER — HEPARIN SOD (PORK) LOCK FLUSH 100 UNIT/ML IV SOLN
500.0000 [IU] | Freq: Once | INTRAVENOUS | Status: AC | PRN
  Administered 2022-09-25: 500 [IU]

## 2022-09-25 MED ORDER — PROCHLORPERAZINE MALEATE 10 MG PO TABS
10.0000 mg | ORAL_TABLET | Freq: Once | ORAL | Status: AC
  Administered 2022-09-25: 10 mg via ORAL
  Filled 2022-09-25: qty 1

## 2022-09-25 MED ORDER — SODIUM CHLORIDE 0.9 % IV SOLN
500.0000 mg/m2 | Freq: Once | INTRAVENOUS | Status: AC
  Administered 2022-09-25: 800 mg via INTRAVENOUS
  Filled 2022-09-25: qty 20

## 2022-09-25 MED ORDER — SODIUM CHLORIDE 0.9% FLUSH
10.0000 mL | INTRAVENOUS | Status: DC | PRN
  Administered 2022-09-25: 10 mL

## 2022-09-25 MED ORDER — SODIUM CHLORIDE 0.9% FLUSH
10.0000 mL | Freq: Once | INTRAVENOUS | Status: AC
  Administered 2022-09-25: 10 mL

## 2022-09-25 MED ORDER — SODIUM CHLORIDE 0.9 % IV SOLN
200.0000 mg | Freq: Once | INTRAVENOUS | Status: AC
  Administered 2022-09-25: 200 mg via INTRAVENOUS
  Filled 2022-09-25: qty 200

## 2022-09-25 MED ORDER — SODIUM CHLORIDE 0.9 % IV SOLN: Freq: Once | INTRAVENOUS | Status: AC

## 2022-09-25 NOTE — Progress Notes (Signed)
Patient seen by [facility] Heilingoetter, PA-C  Vitals are within treatment parameters.  Labs reviewed: and are within treatment parameters.  Per physician team, patient is ready for treatment and there are NO modifications to the treatment plan.

## 2022-10-06 ENCOUNTER — Telehealth: Payer: Self-pay

## 2022-10-06 NOTE — Telephone Encounter (Signed)
This nurse returned call to this patient daughter Hannah [name], who had a question related to the oral contrast for patients CT scan. She states that she was told this time that patient does not need oral contrast and she was drinking it before and she wanted to know has something changed.  This nurse explained there is new protocol and Radiology makes the decision if the patient needs it or not. Unless it is specifically ordered by the physician.  Daughter acknowledged understanding.  No further questions or concerns noted at this time.

## 2022-10-11 ENCOUNTER — Ambulatory Visit (HOSPITAL_BASED_OUTPATIENT_CLINIC_OR_DEPARTMENT_OTHER)
Admission: RE | Admit: 2022-10-11 | Discharge: 2022-10-11 | Disposition: A | Discharge status: Home / Self Care | Payer: Medicare HMO | Source: Ambulatory Visit | Attending: Physician Assistant | Admitting: Physician Assistant

## 2022-10-11 DIAGNOSIS — C3411 Malignant neoplasm of upper lobe, right bronchus or lung: Secondary | ICD-10-CM | POA: Diagnosis not present

## 2022-10-11 DIAGNOSIS — J479 Bronchiectasis, uncomplicated: Secondary | ICD-10-CM | POA: Diagnosis not present

## 2022-10-11 DIAGNOSIS — K5730 Diverticulosis of large intestine without perforation or abscess without bleeding: Secondary | ICD-10-CM | POA: Diagnosis not present

## 2022-10-11 DIAGNOSIS — J90 Pleural effusion, not elsewhere classified: Secondary | ICD-10-CM | POA: Diagnosis not present

## 2022-10-11 DIAGNOSIS — K8020 Calculus of gallbladder without cholecystitis without obstruction: Secondary | ICD-10-CM | POA: Diagnosis not present

## 2022-10-11 MED ORDER — IOHEXOL 300 MG/ML  SOLN
100.0000 mL | Freq: Once | INTRAMUSCULAR | Status: AC | PRN
  Administered 2022-10-11: 70 mL via INTRAVENOUS

## 2022-10-12 ENCOUNTER — Other Ambulatory Visit: Payer: Self-pay | Admitting: Internal Medicine

## 2022-10-13 ENCOUNTER — Encounter: Payer: Self-pay | Admitting: Internal Medicine

## 2022-10-13 ENCOUNTER — Ambulatory Visit: Payer: Medicare HMO

## 2022-10-15 ENCOUNTER — Inpatient Hospital Stay: Payer: Medicare HMO | Attending: Internal Medicine

## 2022-10-15 DIAGNOSIS — J90 Pleural effusion, not elsewhere classified: Secondary | ICD-10-CM | POA: Insufficient documentation

## 2022-10-15 DIAGNOSIS — M545 Low back pain, unspecified: Secondary | ICD-10-CM | POA: Insufficient documentation

## 2022-10-15 DIAGNOSIS — Z8719 Personal history of other diseases of the digestive system: Secondary | ICD-10-CM | POA: Insufficient documentation

## 2022-10-15 DIAGNOSIS — Z79899 Other long term (current) drug therapy: Secondary | ICD-10-CM | POA: Insufficient documentation

## 2022-10-15 DIAGNOSIS — M47814 Spondylosis without myelopathy or radiculopathy, thoracic region: Secondary | ICD-10-CM | POA: Insufficient documentation

## 2022-10-15 DIAGNOSIS — Z87891 Personal history of nicotine dependence: Secondary | ICD-10-CM | POA: Insufficient documentation

## 2022-10-15 DIAGNOSIS — M4317 Spondylolisthesis, lumbosacral region: Secondary | ICD-10-CM | POA: Insufficient documentation

## 2022-10-15 DIAGNOSIS — Z87440 Personal history of urinary (tract) infections: Secondary | ICD-10-CM | POA: Insufficient documentation

## 2022-10-15 DIAGNOSIS — Z79620 Long term (current) use of immunosuppressive biologic: Secondary | ICD-10-CM | POA: Insufficient documentation

## 2022-10-15 DIAGNOSIS — K219 Gastro-esophageal reflux disease without esophagitis: Secondary | ICD-10-CM | POA: Insufficient documentation

## 2022-10-15 DIAGNOSIS — K8020 Calculus of gallbladder without cholecystitis without obstruction: Secondary | ICD-10-CM | POA: Insufficient documentation

## 2022-10-15 DIAGNOSIS — C3411 Malignant neoplasm of upper lobe, right bronchus or lung: Secondary | ICD-10-CM | POA: Insufficient documentation

## 2022-10-15 DIAGNOSIS — F419 Anxiety disorder, unspecified: Secondary | ICD-10-CM | POA: Insufficient documentation

## 2022-10-15 DIAGNOSIS — Z888 Allergy status to other drugs, medicaments and biological substances status: Secondary | ICD-10-CM | POA: Insufficient documentation

## 2022-10-15 DIAGNOSIS — M8580 Other specified disorders of bone density and structure, unspecified site: Secondary | ICD-10-CM | POA: Insufficient documentation

## 2022-10-15 DIAGNOSIS — Z5111 Encounter for antineoplastic chemotherapy: Secondary | ICD-10-CM | POA: Insufficient documentation

## 2022-10-15 DIAGNOSIS — E785 Hyperlipidemia, unspecified: Secondary | ICD-10-CM | POA: Insufficient documentation

## 2022-10-15 DIAGNOSIS — I3481 Nonrheumatic mitral (valve) annulus calcification: Secondary | ICD-10-CM | POA: Insufficient documentation

## 2022-10-15 DIAGNOSIS — Z5112 Encounter for antineoplastic immunotherapy: Secondary | ICD-10-CM | POA: Insufficient documentation

## 2022-10-15 DIAGNOSIS — Z90710 Acquired absence of both cervix and uterus: Secondary | ICD-10-CM | POA: Insufficient documentation

## 2022-10-15 DIAGNOSIS — J479 Bronchiectasis, uncomplicated: Secondary | ICD-10-CM | POA: Insufficient documentation

## 2022-10-15 DIAGNOSIS — C7931 Secondary malignant neoplasm of brain: Secondary | ICD-10-CM | POA: Insufficient documentation

## 2022-10-15 DIAGNOSIS — Z79631 Long term (current) use of antimetabolite agent: Secondary | ICD-10-CM | POA: Insufficient documentation

## 2022-10-16 ENCOUNTER — Encounter: Payer: Self-pay | Admitting: Medical Oncology

## 2022-10-16 ENCOUNTER — Inpatient Hospital Stay: Payer: Medicare HMO

## 2022-10-16 ENCOUNTER — Other Ambulatory Visit: Payer: Self-pay

## 2022-10-16 ENCOUNTER — Other Ambulatory Visit: Payer: Self-pay | Admitting: Internal Medicine

## 2022-10-16 ENCOUNTER — Telehealth: Payer: Self-pay | Admitting: Internal Medicine

## 2022-10-16 ENCOUNTER — Inpatient Hospital Stay (HOSPITAL_BASED_OUTPATIENT_CLINIC_OR_DEPARTMENT_OTHER): Payer: Medicare HMO | Admitting: Internal Medicine

## 2022-10-16 ENCOUNTER — Other Ambulatory Visit: Payer: Self-pay | Admitting: *Deleted

## 2022-10-16 ENCOUNTER — Encounter: Payer: Self-pay | Admitting: Internal Medicine

## 2022-10-16 VITALS — BP 156/81 | HR 70 | Temp 97.6°F | Resp 16

## 2022-10-16 DIAGNOSIS — J90 Pleural effusion, not elsewhere classified: Secondary | ICD-10-CM | POA: Diagnosis not present

## 2022-10-16 DIAGNOSIS — M47814 Spondylosis without myelopathy or radiculopathy, thoracic region: Secondary | ICD-10-CM | POA: Diagnosis not present

## 2022-10-16 DIAGNOSIS — F419 Anxiety disorder, unspecified: Secondary | ICD-10-CM | POA: Diagnosis not present

## 2022-10-16 DIAGNOSIS — I3481 Nonrheumatic mitral (valve) annulus calcification: Secondary | ICD-10-CM | POA: Diagnosis not present

## 2022-10-16 DIAGNOSIS — C3490 Malignant neoplasm of unspecified part of unspecified bronchus or lung: Secondary | ICD-10-CM

## 2022-10-16 DIAGNOSIS — Z90710 Acquired absence of both cervix and uterus: Secondary | ICD-10-CM | POA: Diagnosis not present

## 2022-10-16 DIAGNOSIS — C3411 Malignant neoplasm of upper lobe, right bronchus or lung: Secondary | ICD-10-CM

## 2022-10-16 DIAGNOSIS — M8580 Other specified disorders of bone density and structure, unspecified site: Secondary | ICD-10-CM | POA: Diagnosis not present

## 2022-10-16 DIAGNOSIS — K8020 Calculus of gallbladder without cholecystitis without obstruction: Secondary | ICD-10-CM | POA: Diagnosis not present

## 2022-10-16 DIAGNOSIS — Z87891 Personal history of nicotine dependence: Secondary | ICD-10-CM | POA: Diagnosis not present

## 2022-10-16 DIAGNOSIS — M4317 Spondylolisthesis, lumbosacral region: Secondary | ICD-10-CM | POA: Diagnosis not present

## 2022-10-16 DIAGNOSIS — Z87440 Personal history of urinary (tract) infections: Secondary | ICD-10-CM | POA: Diagnosis not present

## 2022-10-16 DIAGNOSIS — M545 Low back pain, unspecified: Secondary | ICD-10-CM | POA: Diagnosis not present

## 2022-10-16 DIAGNOSIS — Z5111 Encounter for antineoplastic chemotherapy: Secondary | ICD-10-CM | POA: Diagnosis not present

## 2022-10-16 DIAGNOSIS — C7931 Secondary malignant neoplasm of brain: Secondary | ICD-10-CM | POA: Diagnosis not present

## 2022-10-16 DIAGNOSIS — J479 Bronchiectasis, uncomplicated: Secondary | ICD-10-CM | POA: Diagnosis not present

## 2022-10-16 DIAGNOSIS — Z5112 Encounter for antineoplastic immunotherapy: Secondary | ICD-10-CM | POA: Diagnosis not present

## 2022-10-16 DIAGNOSIS — Z888 Allergy status to other drugs, medicaments and biological substances status: Secondary | ICD-10-CM | POA: Diagnosis not present

## 2022-10-16 DIAGNOSIS — Z79631 Long term (current) use of antimetabolite agent: Secondary | ICD-10-CM | POA: Diagnosis not present

## 2022-10-16 DIAGNOSIS — Z79620 Long term (current) use of immunosuppressive biologic: Secondary | ICD-10-CM | POA: Diagnosis not present

## 2022-10-16 DIAGNOSIS — Z95828 Presence of other vascular implants and grafts: Secondary | ICD-10-CM

## 2022-10-16 DIAGNOSIS — Z8719 Personal history of other diseases of the digestive system: Secondary | ICD-10-CM | POA: Diagnosis not present

## 2022-10-16 DIAGNOSIS — E785 Hyperlipidemia, unspecified: Secondary | ICD-10-CM | POA: Diagnosis not present

## 2022-10-16 DIAGNOSIS — K219 Gastro-esophageal reflux disease without esophagitis: Secondary | ICD-10-CM | POA: Diagnosis not present

## 2022-10-16 DIAGNOSIS — Z79899 Other long term (current) drug therapy: Secondary | ICD-10-CM | POA: Diagnosis not present

## 2022-10-16 DIAGNOSIS — C3491 Malignant neoplasm of unspecified part of right bronchus or lung: Secondary | ICD-10-CM

## 2022-10-16 LAB — CBC WITH DIFFERENTIAL (CANCER CENTER ONLY)
Abs Immature Granulocytes: 0.02 10*3/uL (ref 0.00–0.07)
Basophils Absolute: 0 10*3/uL (ref 0.0–0.1)
Basophils Relative: 1 %
Eosinophils Absolute: 0 10*3/uL (ref 0.0–0.5)
Eosinophils Relative: 1 %
HCT: 35.8 % — ABNORMAL LOW (ref 36.0–46.0)
Hemoglobin: 11.3 g/dL — ABNORMAL LOW (ref 12.0–15.0)
Immature Granulocytes: 0 %
Lymphocytes Relative: 27 %
Lymphs Abs: 1.5 10*3/uL (ref 0.7–4.0)
MCH: 28.8 pg (ref 26.0–34.0)
MCHC: 31.6 g/dL (ref 30.0–36.0)
MCV: 91.1 fL (ref 80.0–100.0)
Monocytes Absolute: 0.6 10*3/uL (ref 0.1–1.0)
Monocytes Relative: 11 %
Neutro Abs: 3.4 10*3/uL (ref 1.7–7.7)
Neutrophils Relative %: 60 %
Platelet Count: 316 10*3/uL (ref 150–400)
RBC: 3.93 MIL/uL (ref 3.87–5.11)
RDW: 13.3 % (ref 11.5–15.5)
WBC Count: 5.5 10*3/uL (ref 4.0–10.5)
nRBC: 0 % (ref 0.0–0.2)

## 2022-10-16 LAB — URINALYSIS, COMPLETE (UACMP) WITH MICROSCOPIC
Bilirubin Urine: NEGATIVE
Glucose, UA: NEGATIVE mg/dL
Hgb urine dipstick: NEGATIVE
Ketones, ur: NEGATIVE mg/dL
Nitrite: NEGATIVE
Protein, ur: NEGATIVE mg/dL
Specific Gravity, Urine: 1.003 — ABNORMAL LOW (ref 1.005–1.030)
WBC, UA: >50 WBC/hpf (ref 0–5)
pH: 6 (ref 5.0–8.0)

## 2022-10-16 LAB — CMP (CANCER CENTER ONLY)
ALT: 13 U/L (ref 0–44)
AST: 27 U/L (ref 15–41)
Albumin: 3.6 g/dL (ref 3.5–5.0)
Alkaline Phosphatase: 67 U/L (ref 38–126)
Anion gap: 5 (ref 5–15)
BUN: 9 mg/dL (ref 8–23)
CO2: 30 mmol/L (ref 22–32)
Calcium: 9.6 mg/dL (ref 8.9–10.3)
Chloride: 106 mmol/L (ref 98–111)
Creatinine: 0.82 mg/dL (ref 0.44–1.00)
GFR, Estimated: >60 mL/min (ref >60)
Glucose, Bld: 155 mg/dL — ABNORMAL HIGH (ref 70–99)
Potassium: 3.9 mmol/L (ref 3.5–5.1)
Sodium: 141 mmol/L (ref 135–145)
Total Bilirubin: 0.2 mg/dL — ABNORMAL LOW (ref 0.3–1.2)
Total Protein: 6.6 g/dL (ref 6.5–8.1)

## 2022-10-16 LAB — TSH: TSH: 1.09 u[IU]/mL (ref 0.350–4.500)

## 2022-10-16 MED ORDER — SODIUM CHLORIDE 0.9 % IV SOLN
500.0000 mg/m2 | Freq: Once | INTRAVENOUS | Status: AC
  Administered 2022-10-16: 800 mg via INTRAVENOUS
  Filled 2022-10-16: qty 20

## 2022-10-16 MED ORDER — HEPARIN SOD (PORK) LOCK FLUSH 100 UNIT/ML IV SOLN
500.0000 [IU] | Freq: Once | INTRAVENOUS | Status: AC | PRN
  Administered 2022-10-16: 500 [IU]

## 2022-10-16 MED ORDER — CYANOCOBALAMIN 1000 MCG/ML IJ SOLN
1000.0000 ug | Freq: Once | INTRAMUSCULAR | Status: AC
  Administered 2022-10-16: 1000 ug via INTRAMUSCULAR
  Filled 2022-10-16: qty 1

## 2022-10-16 MED ORDER — SODIUM CHLORIDE 0.9 % IV SOLN: Freq: Once | INTRAVENOUS | Status: AC

## 2022-10-16 MED ORDER — LACOSAMIDE 100 MG PO TABS
1.0000 | ORAL_TABLET | Freq: Two times a day (BID) | ORAL | 0 refills | Status: DC
Start: 2022-10-16 — End: 2022-10-16

## 2022-10-16 MED ORDER — LACOSAMIDE 100 MG PO TABS
1.0000 | ORAL_TABLET | Freq: Two times a day (BID) | ORAL | 3 refills | Status: DC
Start: 2022-10-16 — End: 2023-02-10

## 2022-10-16 MED ORDER — PROCHLORPERAZINE MALEATE 10 MG PO TABS
10.0000 mg | ORAL_TABLET | Freq: Once | ORAL | Status: AC
  Administered 2022-10-16: 10 mg via ORAL
  Filled 2022-10-16: qty 1

## 2022-10-16 MED ORDER — SODIUM CHLORIDE 0.9% FLUSH
10.0000 mL | INTRAVENOUS | Status: DC | PRN
  Administered 2022-10-16: 10 mL

## 2022-10-16 MED ORDER — SODIUM CHLORIDE 0.9 % IV SOLN
200.0000 mg | Freq: Once | INTRAVENOUS | Status: AC
  Administered 2022-10-16: 200 mg via INTRAVENOUS
  Filled 2022-10-16: qty 200

## 2022-10-16 MED ORDER — SODIUM CHLORIDE 0.9% FLUSH
10.0000 mL | Freq: Once | INTRAVENOUS | Status: AC
  Administered 2022-10-16: 10 mL

## 2022-10-16 MED ORDER — SULFAMETHOXAZOLE-TRIMETHOPRIM 800-160 MG PO TABS: 1.0000 | ORAL_TABLET | Freq: Two times a day (BID) | ORAL | 0 refills | Status: DC

## 2022-10-16 NOTE — Patient Instructions (Signed)
[facility]  Discharge Instructions: Thank you for choosing [facility] to provide your oncology and hematology care.   If you have a lab appointment with the Cancer Center, please go directly to the Cancer Center and check in at the registration area.   Wear comfortable clothing and clothing appropriate for easy access to any Portacath or PICC line.   We strive to give you quality time with your provider. You may need to reschedule your appointment if you arrive late (15 or more minutes).  Arriving late affects you and other patients whose appointments are after yours.  Also, if you miss three or more appointments without notifying the office, you may be dismissed from the clinic at the provider's discretion.      For prescription refill requests, have your pharmacy contact our office and allow 72 hours for refills to be completed.    Today you received the following chemotherapy and/or immunotherapy agents keytruda      To help prevent nausea and vomiting after your treatment, we encourage you to take your nausea medication as directed.  BELOW ARE SYMPTOMS THAT SHOULD BE REPORTED IMMEDIATELY: *FEVER GREATER THAN 100.4 F (38 C) OR HIGHER *CHILLS OR SWEATING *NAUSEA AND VOMITING THAT IS NOT CONTROLLED WITH YOUR NAUSEA MEDICATION *UNUSUAL SHORTNESS OF BREATH *UNUSUAL BRUISING OR BLEEDING *URINARY PROBLEMS (pain or burning when urinating, or frequent urination) *BOWEL PROBLEMS (unusual diarrhea, constipation, pain near the anus) TENDERNESS IN MOUTH AND THROAT WITH OR WITHOUT PRESENCE OF ULCERS (sore throat, sores in mouth, or a toothache) UNUSUAL RASH, SWELLING OR PAIN  UNUSUAL VAGINAL DISCHARGE OR ITCHING   Items with * indicate a potential emergency and should be followed up as soon as possible or go to the Emergency Department if any problems should occur.  Please show the CHEMOTHERAPY ALERT CARD or IMMUNOTHERAPY ALERT CARD at  check-in to the Emergency Department and triage nurse.  Should you have questions after your visit or need to cancel or reschedule your appointment, please contact [facility]  Dept: [phone]  and follow the prompts.  Office hours are 8:00 a.m. to 4:30 p.m. Monday - Friday. Please note that voicemails left after 4:00 p.m. may not be returned until the following business day.  We are closed weekends and major holidays. You have access to a nurse at all times for urgent questions. Please call the main number to the clinic Dept: [phone] and follow the prompts.   For any non-urgent questions, you may also contact your provider using MyChart. We now offer e-Visits for anyone 18 and older to request care online for non-urgent symptoms. For details visit [email or web address].   Also download the MyChart app! Go to the app store, search "MyChart", open the app, select , and log in with your MyChart username and password.   

## 2022-10-16 NOTE — Telephone Encounter (Signed)
Patients daughter called to check why port flush was removed from todays visits; reached out to nurse & added.

## 2022-10-16 NOTE — Progress Notes (Signed)
[facility] Telephone:([phone]) [phone]   Fax:([phone]) [phone]  OFFICE PROGRESS NOTE  Pincus Sanes, MD [address] [place] [address]  DIAGNOSIS: Stage IV non-small cell lung cancer (T3, N0, M1C) adenocarcinoma.  The patient presented with a and solitary brain metastasis.  The patient was diagnosed in February 2022.  Biomarker Findings Microsatellite status - MS-Stable Tumor Mutational Burden - 8 Muts/Mb Genomic Findings For a complete list of the genes assayed, please refer to the Appendix. KRAS G12V KEAP1 S224F TP53 P151T 7 Disease relevant genes with no reportable alterations: ALK, BRAF, EGFR, ERBB2, MET, RET, ROS1  PDL1 Expression: 50%    PRIOR THERAPY: 1) SRS to the metastatic brain lesion on 08/23/2020 under the care of Dr. [name] and craniotomy under the care of Dr. Maurice Small scheduled for 08/24/2020. 2) S/p robotic assisted right upper lobectomy with en bloc wedge resection of the right middle lobe and lymph node dissection under the care of Dr. Dorris Fetch on September 24, 2020. 3) status post SRS to a new subcentimeter brain lesions under the care of Dr. Mitzi Hansen. 4) SRS to brain metastasis.   CURRENT THERAPY: Systemic chemotherapy with carboplatin for AUC of 5, Alimta 500 Mg/M2 and Keytruda 200 mg IV every 3 weeks.  First dose Nov 07, 2020.  Status post 32 cycles.  Starting from cycle #5 the patient will be on maintenance treatment with Alimta and Keytruda every 3 weeks.  INTERVAL HISTORY: Hannah [name] 72 y.o. female returns to the clinic today for follow-up visit.  The patient is feeling fine today with no concerning complaints except for lower back pain with radiation to the left inguinal area.  She was treated for urinary tract infection 3 weeks ago.  She has some thickening seen in the bladder on the recent scan.  She denied having any current chest pain, shortness of breath except with exertion with no cough or hemoptysis.  She has no nausea,  vomiting, diarrhea or constipation.  She has no headache or visual changes.  She had repeat CT scan of the chest, abdomen and pelvis performed recently and she is here for evaluation and discussion of her scan results.  MEDICAL HISTORY: Past Medical History:  Diagnosis Date   Allergy    Anemia    Anxiety    Arthritis    Carpal tunnel syndrome    Constipation    senna C stool softeners help    Depression    Diverticulosis    Dyslipidemia    External hemorrhoids    GERD (gastroesophageal reflux disease)    Heart murmur    mild-moderate AR   Hiatal hernia    Hyperlipidemia    on meds    Hypertension    Internal hemorrhoids    lung ca with brain mets 07/2020   Osteoarthritis    Pre-diabetes    PVD (peripheral vascular disease) (HCC)    moderate carotid disease   RBBB    Smoker    Vocal cord polyps     ALLERGIES:  is allergic to amlodipine, chantix [varenicline tartrate], clarithromycin, lisinopril, simvastatin, wellbutrin [bupropion hcl], lipitor [atorvastatin], and sertraline.  MEDICATIONS:  Current Outpatient Medications  Medication Sig Dispense Refill   acetaminophen (TYLENOL) 325 MG tablet Take 325 mg by mouth every 6 (six) hours as needed for moderate pain.     ALPRAZolam (XANAX XR) 0.5 MG 24 hr tablet Take 1 tablet (0.5 mg total) by mouth in the morning. 30 tablet 5  ALPRAZolam (XANAX) 0.5 MG tablet Take 1 tablet (0.5 mg total) by mouth 3 (three) times daily as needed for anxiety. 90 tablet 0   aspirin 81 MG tablet Take 1 tablet (81 mg total) by mouth daily. Restart on 08/31/20 30 tablet    bisacodyl (DULCOLAX) 5 MG EC tablet Take 5 mg by mouth daily as needed for moderate constipation.     carboxymethylcellulose (REFRESH PLUS) 0.5 % SOLN Place 1-2 drops into both eyes 3 (three) times daily as needed (dry eyes).     Cholecalciferol (D3 PO) Take 1 capsule by mouth daily.     citalopram (CELEXA) 10 MG tablet Take 1 tablet (10 mg total) by mouth daily. 90 tablet 0    fluticasone (FLONASE) 50 MCG/ACT nasal spray PLACE 2 SPRAYS INTO BOTH NOSTRILS DAILY. (Patient taking differently: Place 2 sprays into both nostrils daily as needed for allergies.) 48 g 1   folic acid (FOLVITE) 1 MG tablet Take 1 tablet by mouth once daily 30 tablet 0   ketoconazole (NIZORAL) 2 % cream Apply 1 Application topically daily. 30 g 1   Lacosamide 100 MG TABS Take 1 tablet by mouth twice daily 60 tablet 0   levETIRAcetam (KEPPRA) 100 MG/ML solution TAKE 15 ML BY MOUTH  TWICE DAILY 473 mL 0   lidocaine-prilocaine (EMLA) cream Apply 1 Application topically as needed. 30 g 2   loratadine (CLARITIN) 10 MG tablet Take 10 mg by mouth daily as needed for allergies.     magnesium hydroxide (MILK OF MAGNESIA) 800 MG/5ML suspension Take 30 mLs by mouth daily as needed for constipation.     Menthol, Topical Analgesic, (BIOFREEZE EX) Apply 1 application topically as needed (pain).     mirtazapine (REMERON) 30 MG tablet Take 30 mg by mouth at bedtime.     olopatadine (PATANOL) 0.1 % ophthalmic solution Place 1 drop into both eyes 2 (two) times daily as needed for allergies.     omeprazole (PRILOSEC) 40 MG capsule Take 1 capsule (40 mg total) by mouth daily. 90 capsule 1   polyethylene glycol (MIRALAX / GLYCOLAX) 17 g packet Take 17 g by mouth 2 (two) times daily. 72 each 0   pravastatin (PRAVACHOL) 40 MG tablet TAKE 1 TABLET EVERY EVENING 90 tablet 3   Probiotic Product (PROBIOTIC DAILY PO) Take 1 capsule by mouth daily.     prochlorperazine (COMPAZINE) 10 MG tablet TAKE 1 TABLET BY MOUTH EVERY 6 HOURS AS NEEDED FOR NAUSEA OR VOMITING 30 tablet 0   simethicone (MYLICON) 80 MG chewable tablet Chew 1 tablet (80 mg total) by mouth 4 (four) times daily as needed for flatulence (Bloating). 30 tablet 0   No current facility-administered medications for this visit.    SURGICAL HISTORY:  Past Surgical History:  Procedure Laterality Date   ABDOMINAL HYSTERECTOMY  1994   APPLICATION OF CRANIAL  NAVIGATION N/A 08/24/2020   Procedure: APPLICATION OF CRANIAL NAVIGATION;  Surgeon: Jadene Pierini, MD;  Location: [facility] OR;  Service: Neurosurgery;  Laterality: N/A;   COLONOSCOPY     COLONOSCOPY W/ POLYPECTOMY  2009   CRANIOTOMY Left 08/24/2020   Procedure: Left Craniotomy for Tumor Resection with Brainlab;  Surgeon: Jadene Pierini, MD;  Location: [facility] OR;  Service: Neurosurgery;  Laterality: Left;   HEMORRHOID SURGERY     INTERCOSTAL NERVE BLOCK Right 09/24/2020   Procedure: INTERCOSTAL NERVE BLOCK;  Surgeon: Loreli Slot, MD;  Location: [facility] OR;  Service: Thoracic;  Laterality: Right;   IR IMAGING GUIDED PORT  INSERTION  05/15/2022   JOINT REPLACEMENT     LYMPH NODE DISSECTION Right 09/24/2020   Procedure: LYMPH NODE DISSECTION;  Surgeon: Loreli Slot, MD;  Location: [facility] OR;  Service: Thoracic;  Laterality: Right;   POLYPECTOMY     right total knee arthroplasty     Dr. Sherle Poe 06-04-18   SPINE SURGERY  08/16/2009   TOTAL KNEE ARTHROPLASTY Left 02/13/2014   Procedure: TOTAL KNEE ARTHROPLASTY;  Surgeon: Harvie Junior, MD;  Location: [facility] OR;  Service: Orthopedics;  Laterality: Left;   TOTAL KNEE ARTHROPLASTY Right 06/04/2018   Procedure: RIGHT TOTAL KNEE ARTHROPLASTY;  Surgeon: Jodi Geralds, MD;  Location: [facility] ORS;  Service: Orthopedics;  Laterality: Right;  Adductor Block   TUBAL LIGATION      REVIEW OF SYSTEMS:  Constitutional: positive for fatigue Eyes: positive for redness Ears, nose, mouth, throat, and face: negative Respiratory: positive for dyspnea on exertion Cardiovascular: negative Gastrointestinal: negative Genitourinary:negative Integument/breast: negative Hematologic/lymphatic: negative Musculoskeletal:negative Neurological: negative Behavioral/Psych: negative Endocrine: negative Allergic/Immunologic: negative   PHYSICAL EXAMINATION: General appearance: alert, cooperative, fatigued, and no distress Head: Normocephalic, without obvious abnormality,  atraumatic Neck: no adenopathy, no JVD, supple, symmetrical, trachea midline, and thyroid not enlarged, symmetric, no tenderness/mass/nodules Lymph nodes: Cervical, supraclavicular, and axillary nodes normal. Resp: clear to auscultation bilaterally Back: symmetric, no curvature. ROM normal. No CVA tenderness. Cardio: regular rate and rhythm, S1, S2 normal, no murmur, click, rub or gallop GI: soft, non-tender; bowel sounds normal; no masses,  no organomegaly Extremities: extremities normal, atraumatic, no cyanosis or edema Neurologic: Alert and oriented X 3, normal strength and tone. Normal symmetric reflexes. Normal coordination and gait  ECOG PERFORMANCE STATUS: 1 - Symptomatic but completely ambulatory  Blood pressure (!) 147/65, pulse 76, temperature 99.8 F (37.7 C), temperature source Oral, resp. rate 16, weight 133 lb 11.2 oz (60.6 kg), SpO2 99 %.  LABORATORY DATA: Lab Results  Component Value Date   WBC 5.8 09/25/2022   HGB 11.5 (L) 09/25/2022   HCT 36.0 09/25/2022   MCV 90.7 09/25/2022   PLT 309 09/25/2022      Chemistry      Component Value Date/Time   NA 140 09/25/2022 0928   K 4.0 09/25/2022 0928   CL 104 09/25/2022 0928   CO2 31 09/25/2022 0928   BUN 13 09/25/2022 0928   CREATININE 0.78 09/25/2022 0928   CREATININE 0.79 03/14/2020 1016      Component Value Date/Time   CALCIUM 9.4 09/25/2022 0928   ALKPHOS 59 09/25/2022 0928   AST 22 09/25/2022 0928   ALT 16 09/25/2022 0928   BILITOT 0.2 (L) 09/25/2022 0928       RADIOGRAPHIC STUDIES: CT CHEST ABDOMEN PELVIS W CONTRAST  Addendum Date: 10/14/2022   ADDENDUM REPORT: 10/14/2022 12:50 ADDENDUM: Additional impression. Wall thickening of the urinary bladder. Please correlate for any evidence of cystitis. Electronically Signed   By: Karen Kays M.D.   On: 10/14/2022 12:50   Result Date: 10/14/2022 CLINICAL DATA:  Primary cancer right upper lobe of the lung. On maintenance chemotherapy. Brain metastases. *  Tracking Code: BO * EXAM: CT CHEST, ABDOMEN, AND PELVIS WITH CONTRAST TECHNIQUE: Multidetector CT imaging of the chest, abdomen and pelvis was performed following the standard protocol during bolus administration of intravenous contrast. RADIATION DOSE REDUCTION: This exam was performed according to the departmental dose-optimization program which includes automated exposure control, adjustment of the mA and/or kV according to patient size and/or use of iterative reconstruction technique. CONTRAST:  70mL OMNIPAQUE IOHEXOL  300 MG/ML  SOLN COMPARISON:  CT 07/22/2022 and older FINDINGS: CT CHEST FINDINGS Cardiovascular: Right upper chest port. Heart is nonenlarged. Trace pericardial fluid. Normal caliber thoracic aorta with scattered atherosclerotic partially calcified plaque. Coronary artery calcifications are seen. Calcifications of the mitral valve annulus. Mediastinum/Nodes: No specific abnormal lymph node enlargement identified in the axillary regions, hilum or mediastinum. Normal caliber thoracic esophagus with slight wall thickening, nonspecific. Slightly heterogeneous thyroid gland, unchanged. Lungs/Pleura: Left lung is without consolidation, pneumothorax or effusion. There is an area of bronchiectasis in the anterior aspect of the left lower lobe some debris along this bronchus. Debris is increased from previous. Nonspecific. Stable 3 mm nodule left lower lobe on series 4, image 112. Mild paraseptal changes. Stable mild spiculated area in the right upper lobe with some dystrophic calcification. This is in the area of surgical change. Right basilar scar or atelectasis. No right-sided consolidation, pneumothorax. Decreasing right effusion with trace residual. No new dominant nodule. Musculoskeletal: Slight curvature of the spine. Diffuse degenerative changes of the thoracic spine. CT ABDOMEN PELVIS FINDINGS Hepatobiliary: Patent portal vein. No space-occupying liver lesion. Stones in the nondilated gallbladder.  Pancreas: Unremarkable. No pancreatic ductal dilatation or surrounding inflammatory changes. Spleen: Normal in size without focal abnormality. Adrenals/Urinary Tract: The right adrenal gland is unremarkable. The left has a small area of nodular thickening along the inferior aspect measuring 8 by 6 mm on series 2, image 58. This is very small but appears slightly increased from previous exam. Recommend attention on follow-up. No enhancing renal mass or collecting system dilatation diffuse wall thickening of the urinary bladder. Stomach/Bowel: Left-sided colonic diverticula. Scattered colonic stool. Large bowel is nondilated. Normal retrocecal appendix. The stomach and small bowel are nondilated. Again there are some loops of small bowel extending lateral to the descending colon, atypical distribution of bowel. Vascular/Lymphatic: Normal caliber aorta and IVC with scattered vascular calcifications. Stable mild ectasia of the infrarenal abdominal aorta measuring up to 2.8 cm today, similar to prior. No specific abnormal lymph node enlargement identified in the abdomen and pelvis. Reproductive: Status post hysterectomy. No adnexal masses. Other: No abdominal wall hernia or abnormality. No abdominopelvic ascites. Musculoskeletal: Degenerative changes are seen along the spine and pelvis. Fixation hardware along the lumbar spine. Osteopenia. Trace retrolisthesis of L5 on S1. Focal lipoma along the anterior right thigh musculature stable area of sclerosis along the right acetabulum, likely bone island. IMPRESSION: Stable surgical changes in the upper right hemithorax with some nodularity and calcification. Decreasing right pleural effusion. Slight increase in focal nodularity along the inferolateral inferior aspect of the lateral limb of the left adrenal gland. Attention on follow-up. Stable ectasia of the infrarenal abdominal aorta. Recommend follow-up ultrasound every 5 years. This recommendation follows ACR consensus  guidelines: White Paper of the ACR Incidental Findings Committee II on Vascular Findings. J Am Coll Radiol 2013; 10:789-794. Gallstones.  Colonic diverticula. Electronically Signed: By: Karen Kays M.D. On: 10/14/2022 12:08     ASSESSMENT AND PLAN: This is a very pleasant 72 years old African-American female diagnosed with stage IV (T3, N0, M1b) non-small cell lung cancer, adenocarcinoma presented with right upper lobe lung mass with solitary brain metastasis diagnosed in February 2022 status post SRS to the brain lesion followed by craniotomy and resection. The patient also has a solitary lung mass. S/p robotic assisted right upper lobectomy with en bloc wedge resection of the right middle lobe and lymph node dissection under the care of Dr. Dorris Fetch on September 24, 2020. She also underwent  SRS treatment to a new subcentimeter brain lesions under the care of Dr. Mitzi Hansen She is currently undergoing systemic chemotherapy with carboplatin for AUC of 5, Alimta 500 Mg/M2 and Keytruda 200 Mg IV every 3 weeks status post 32 cycles.  Starting from cycle #5 the patient will be on maintenance treatment with Alimta and Keytruda every 3 weeks.   The patient has been tolerating her treatment fairly well except for mild fatigue. She had repeat CT scan of the chest, abdomen and pelvis performed recently.  I personally and independently reviewed the scan and discussed the result with the patient and her daughter.  Her scan showed no concerning findings for disease progression.  She continues to have some thickening of the urinary bladder.  I will check urinalysis to rule out urinary tract infection. I recommended for the patient to continue her current treatment and she will proceed with cycle #33 today. For the brain metastasis, she is currently followed by radiation oncology and Dr. Barbaraann Cao. I will see her back for follow-up visit in 3 weeks for evaluation before starting cycle #34. The patient was advised to call  immediately if she has any other concerning symptoms in the interval. The patient voices understanding of current disease status and treatment options and is in agreement with the current care plan.  All questions were answered. The patient knows to call the clinic with any problems, questions or concerns. We can certainly see the patient much sooner if necessary.  Disclaimer: This note was dictated with voice recognition software. Similar sounding words can inadvertently be transcribed and may not be corrected upon review.

## 2022-10-16 NOTE — Progress Notes (Signed)
Patient seen by Dr. Gypsy Balsam are within treatment parameters.  Labs reviewed: and are within treatment parameters.  Per physician team, patient is ready for treatment and there are NO modifications to the treatment plan.   Pt requests refill for lacosamide. Dr Liana Gerold nurse notified.

## 2022-10-18 LAB — T4: T4, Total: 8.1 ug/dL (ref 4.5–12.0)

## 2022-10-21 ENCOUNTER — Ambulatory Visit: Payer: Self-pay

## 2022-10-21 NOTE — Patient Outreach (Signed)
  Care Coordination   Follow Up Visit Note   10/21/2022 Name: Hannah [name] MRN: [id number] DOB: February 08, 1951  Hannah [name] is a 72 y.o. year old female who sees Burns, Bobette Mo, MD for primary care. I spoke with  Hannah [name] and daughter Hannah [name]/designer) by phone today.  What matters to the patients health and wellness today?  Resources for depends. Hannah [name] states she called, but did not get a call back. Daughter reports she will call [employer] [facility] [place] for patient. She also states she is familiar with the OTC benefit through insurance provider. Both daughter and Hannah [name] deny any other questions or concerns at this time. Daughter/patient to contact RNCM if care coordination needs in the future.  Goals Addressed             This Visit's Progress    COMPLETED: Publishing rights manager needs       Interventions Today    Flowsheet Row Most Recent Value  General Interventions   General Interventions Discussed/Reviewed General Interventions Reviewed, Doctor Visits, [facility]  [provided contact number to [employer] [facility] [place] again [phone] and [phone].]  Doctor Visits Discussed/Reviewed Doctor Visits Reviewed  Education Interventions   Education Provided Provided Education  Provided Verbal Education On Other, Development worker, community  [encouraged to continue to eat healthy avoid saturated/transfat and processed foods, encouraged to continue to attend provider visits as schedule and take medications as prescribed. reiterated OTC benefits with [facility] insurance]  Pharmacy Interventions   Pharmacy Dicussed/Reviewed Pharmacy Topics Reviewed            SDOH assessments and interventions completed:  No  Care Coordination Interventions:  Yes, provided   Follow up plan: No further intervention required.   Encounter Outcome:  Pt. Visit Completed   Hannah Sheriff, RN, MSN, BSN, CCM [facility] Care Coordinator [phone]

## 2022-10-21 NOTE — Patient Instructions (Addendum)
Visit Information  Thank you for taking time to visit with me today. Please don't hesitate to contact me if I can be of assistance to you.   Following are the goals we discussed today:  Continue to eat balanced meals. Avoid processed food and saturated/trans fats. Continue to take medications as prescribed. Contact number for [employer] [facility] [place] [phone] and [phone]   If you are experiencing a Mental Health or Behavioral Health Crisis or need someone to talk to, please call the Suicide and Crisis Lifeline: 46  Kathyrn Sheriff, RN, MSN, BSN, CCM [facility] Care Coordinator [phone]

## 2022-10-22 ENCOUNTER — Other Ambulatory Visit: Payer: Self-pay

## 2022-10-22 ENCOUNTER — Ambulatory Visit: Payer: Medicare HMO | Admitting: Family Medicine

## 2022-10-22 ENCOUNTER — Encounter: Payer: Self-pay | Admitting: Family Medicine

## 2022-10-22 VITALS — BP 130/76 | HR 95 | Ht 60.0 in | Wt 130.0 lb

## 2022-10-22 DIAGNOSIS — M25511 Pain in right shoulder: Secondary | ICD-10-CM | POA: Diagnosis not present

## 2022-10-22 DIAGNOSIS — M25512 Pain in left shoulder: Secondary | ICD-10-CM

## 2022-10-22 DIAGNOSIS — G8929 Other chronic pain: Secondary | ICD-10-CM

## 2022-10-22 NOTE — Progress Notes (Signed)
I, Stevenson Clinch, CMA acting as a scribe for Clementeen Graham, MD.  Hannah [name] is a 72 y.o. female who presents to [facility] today for cont'd bilat arm pain. Pt was last seen by Dr. Denyse Amass on 09/16/22 and was given bilat GH steroid injections and was advised to schedule a f/u after April 11th for repeat subacromial steroid injection.   Today, pt reports flare up of bilat arm pain x 1 month. C/o decreased ROM, radiating pain into the arms, and n/t in the hands. Denies new injury.   Dx imaging: 12/11/21 R & L shoulder XR  Pertinent review of systems: No fevers or chills.  Relevant historical information: Lung cancer history.   Exam:  BP 130/76   Pulse 95   Ht 5' (1.524 m)   Wt 130 lb (59 kg)   SpO2 97%   BMI 25.39 kg/m  General: Well Developed, well nourished, and in no acute distress.   MSK: Shoulders bilaterally normal-appearing decreased motion pain with abduction.  Intact strength.    Lab and Radiology Results  Procedure: Real-time Ultrasound Guided Injection of left shoulder subacromial bursa Device: Philips Affiniti 50G Images permanently stored and available for review in PACS Verbal informed consent obtained.  Discussed risks and benefits of procedure. Warned about infection, bleeding, hyperglycemia damage to structures among others. Patient expresses understanding and agreement Time-out conducted.   Noted no overlying erythema, induration, or other signs of local infection.   Skin prepped in a sterile fashion.   Local anesthesia: Topical Ethyl chloride.   With sterile technique and under real time ultrasound guidance: 40 mg of Kenalog and 2 mL's of Marcaine injected into subacromial bursa. Fluid seen entering the bursa.   Completed without difficulty   Pain immediately resolved suggesting accurate placement of the medication.   Advised to call if fevers/chills, erythema, induration, drainage, or persistent bleeding.   Images  permanently stored and available for review in the ultrasound unit.  Impression: Technically successful ultrasound guided injection.    Procedure: Real-time Ultrasound Guided Injection of right shoulder subacromial bursa Device: Philips Affiniti 50G Images permanently stored and available for review in PACS Verbal informed consent obtained.  Discussed risks and benefits of procedure. Warned about infection, bleeding, hyperglycemia damage to structures among others. Patient expresses understanding and agreement Time-out conducted.   Noted no overlying erythema, induration, or other signs of local infection.   Skin prepped in a sterile fashion.   Local anesthesia: Topical Ethyl chloride.   With sterile technique and under real time ultrasound guidance: 40 mg of Kenalog and 2 mL's of Marcaine injected into subacromial bursa. Fluid seen entering the bursa.   Completed without difficulty   Pain immediately resolved suggesting accurate placement of the medication.   Advised to call if fevers/chills, erythema, induration, drainage, or persistent bleeding.   Images permanently stored and available for review in the ultrasound unit.  Impression: Technically successful ultrasound guided injection.         Assessment and Plan: 72 y.o. female with bilateral shoulder pain thought to be subacromial impingement and bursitis.  Injection occurring 3 months ago into bilateral subacromial bursa's worked quite well but did not last 3 months.  She had it intra-articular glenohumeral injection a month ago which was mediocre.  Plan for repeat subacromial bursa injection.  I am concerned that today's injection may not last the 3 months that needs to before we can repeat the injection.  I recommended physical therapy trial.  She declined for now.  Recheck in 3 months.   PDMP not reviewed this encounter. Orders Placed This Encounter  Procedures   [place] LIMITED JOINT SPACE STRUCTURES UP BILAT(NO LINKED CHARGES)     Order Specific Question:   Reason for Exam (SYMPTOM  OR DIAGNOSIS REQUIRED)    Answer:   bilat arm/shoulder pain    Order Specific Question:   Preferred imaging location?    Answer:   [facility]   No orders of the defined types were placed in this encounter.    Discussed warning signs or symptoms. Please see discharge instructions. Patient expresses understanding.   The above documentation has been reviewed and is accurate and complete Clementeen Graham, M.D.

## 2022-10-22 NOTE — Patient Instructions (Addendum)
Thank you for coming in today.   You received an injection today. Seek immediate medical attention if the joint becomes red, extremely painful, or is oozing fluid.   Check back in 3 months 

## 2022-10-24 ENCOUNTER — Telehealth: Payer: Self-pay | Admitting: *Deleted

## 2022-10-24 NOTE — Telephone Encounter (Signed)
Received call from pt's daughter, [name] Thorton. She states her mother has had some recent c/o pain on the top of her head and occasionally at the back right side of her head. No other changes noted. Pt has been very active this week-she has been to church 3 x this week.  No change in speech or comprehension-perhaps a bit of fatigue. She has taken tylenol with relief though this makes her sleepy. Daughter was wondering if she needed her brain scan done any sooner than its scheduled date of 11/19/48  Please advise

## 2022-10-24 NOTE — Telephone Encounter (Signed)
Received message back from Dr. Barbaraann Cao. He does not recommend an earlier scan with her current symptoms. Call made to daughter, [name] and advised of this. Advised to continue with OTC tylenol as needed for headache/pain  [name] voiced understanding.

## 2022-10-26 ENCOUNTER — Other Ambulatory Visit: Payer: Self-pay | Admitting: Internal Medicine

## 2022-11-06 ENCOUNTER — Inpatient Hospital Stay (HOSPITAL_BASED_OUTPATIENT_CLINIC_OR_DEPARTMENT_OTHER): Payer: Medicare HMO | Admitting: Adult Health

## 2022-11-06 ENCOUNTER — Ambulatory Visit: Payer: Medicare HMO | Admitting: Internal Medicine

## 2022-11-06 ENCOUNTER — Inpatient Hospital Stay: Payer: Medicare HMO | Attending: Internal Medicine

## 2022-11-06 ENCOUNTER — Other Ambulatory Visit: Payer: Medicare HMO

## 2022-11-06 ENCOUNTER — Inpatient Hospital Stay: Payer: Medicare HMO

## 2022-11-06 ENCOUNTER — Other Ambulatory Visit: Payer: Self-pay

## 2022-11-06 ENCOUNTER — Encounter: Payer: Self-pay | Admitting: Adult Health

## 2022-11-06 ENCOUNTER — Ambulatory Visit: Payer: Medicare HMO

## 2022-11-06 VITALS — BP 147/66 | HR 65 | Resp 16

## 2022-11-06 DIAGNOSIS — F32A Depression, unspecified: Secondary | ICD-10-CM | POA: Insufficient documentation

## 2022-11-06 DIAGNOSIS — Z888 Allergy status to other drugs, medicaments and biological substances status: Secondary | ICD-10-CM | POA: Insufficient documentation

## 2022-11-06 DIAGNOSIS — Z87891 Personal history of nicotine dependence: Secondary | ICD-10-CM | POA: Diagnosis not present

## 2022-11-06 DIAGNOSIS — Z833 Family history of diabetes mellitus: Secondary | ICD-10-CM | POA: Diagnosis not present

## 2022-11-06 DIAGNOSIS — C3411 Malignant neoplasm of upper lobe, right bronchus or lung: Secondary | ICD-10-CM | POA: Diagnosis not present

## 2022-11-06 DIAGNOSIS — Z79631 Long term (current) use of antimetabolite agent: Secondary | ICD-10-CM | POA: Diagnosis not present

## 2022-11-06 DIAGNOSIS — Z5112 Encounter for antineoplastic immunotherapy: Secondary | ICD-10-CM | POA: Insufficient documentation

## 2022-11-06 DIAGNOSIS — F419 Anxiety disorder, unspecified: Secondary | ICD-10-CM | POA: Insufficient documentation

## 2022-11-06 DIAGNOSIS — Z95828 Presence of other vascular implants and grafts: Secondary | ICD-10-CM

## 2022-11-06 DIAGNOSIS — Z8719 Personal history of other diseases of the digestive system: Secondary | ICD-10-CM | POA: Insufficient documentation

## 2022-11-06 DIAGNOSIS — Z8042 Family history of malignant neoplasm of prostate: Secondary | ICD-10-CM | POA: Diagnosis not present

## 2022-11-06 DIAGNOSIS — Z800 Family history of malignant neoplasm of digestive organs: Secondary | ICD-10-CM | POA: Diagnosis not present

## 2022-11-06 DIAGNOSIS — Z79899 Other long term (current) drug therapy: Secondary | ICD-10-CM | POA: Diagnosis not present

## 2022-11-06 DIAGNOSIS — E782 Mixed hyperlipidemia: Secondary | ICD-10-CM | POA: Diagnosis not present

## 2022-11-06 DIAGNOSIS — C7931 Secondary malignant neoplasm of brain: Secondary | ICD-10-CM | POA: Insufficient documentation

## 2022-11-06 DIAGNOSIS — Z803 Family history of malignant neoplasm of breast: Secondary | ICD-10-CM | POA: Insufficient documentation

## 2022-11-06 DIAGNOSIS — Z79620 Long term (current) use of immunosuppressive biologic: Secondary | ICD-10-CM | POA: Diagnosis not present

## 2022-11-06 DIAGNOSIS — Z823 Family history of stroke: Secondary | ICD-10-CM | POA: Diagnosis not present

## 2022-11-06 DIAGNOSIS — Z90710 Acquired absence of both cervix and uterus: Secondary | ICD-10-CM | POA: Insufficient documentation

## 2022-11-06 DIAGNOSIS — E119 Type 2 diabetes mellitus without complications: Secondary | ICD-10-CM | POA: Diagnosis not present

## 2022-11-06 DIAGNOSIS — Z5111 Encounter for antineoplastic chemotherapy: Secondary | ICD-10-CM | POA: Diagnosis not present

## 2022-11-06 DIAGNOSIS — Z8379 Family history of other diseases of the digestive system: Secondary | ICD-10-CM | POA: Diagnosis not present

## 2022-11-06 DIAGNOSIS — C3490 Malignant neoplasm of unspecified part of unspecified bronchus or lung: Secondary | ICD-10-CM

## 2022-11-06 DIAGNOSIS — K219 Gastro-esophageal reflux disease without esophagitis: Secondary | ICD-10-CM | POA: Insufficient documentation

## 2022-11-06 DIAGNOSIS — M25472 Effusion, left ankle: Secondary | ICD-10-CM | POA: Diagnosis not present

## 2022-11-06 DIAGNOSIS — C3491 Malignant neoplasm of unspecified part of right bronchus or lung: Secondary | ICD-10-CM

## 2022-11-06 LAB — CBC WITH DIFFERENTIAL (CANCER CENTER ONLY)
Abs Immature Granulocytes: 0.03 10*3/uL (ref 0.00–0.07)
Basophils Absolute: 0 10*3/uL (ref 0.0–0.1)
Basophils Relative: 1 %
Eosinophils Absolute: 0 10*3/uL (ref 0.0–0.5)
Eosinophils Relative: 0 %
HCT: 36 % (ref 36.0–46.0)
Hemoglobin: 11.6 g/dL — ABNORMAL LOW (ref 12.0–15.0)
Immature Granulocytes: 0 %
Lymphocytes Relative: 25 %
Lymphs Abs: 1.6 10*3/uL (ref 0.7–4.0)
MCH: 29.4 pg (ref 26.0–34.0)
MCHC: 32.2 g/dL (ref 30.0–36.0)
MCV: 91.4 fL (ref 80.0–100.0)
Monocytes Absolute: 0.7 10*3/uL (ref 0.1–1.0)
Monocytes Relative: 10 %
Neutro Abs: 4.3 10*3/uL (ref 1.7–7.7)
Neutrophils Relative %: 64 %
Platelet Count: 265 10*3/uL (ref 150–400)
RBC: 3.94 MIL/uL (ref 3.87–5.11)
RDW: 13.1 % (ref 11.5–15.5)
WBC Count: 6.7 10*3/uL (ref 4.0–10.5)
nRBC: 0 % (ref 0.0–0.2)

## 2022-11-06 LAB — CMP (CANCER CENTER ONLY)
ALT: 14 U/L (ref 0–44)
AST: 21 U/L (ref 15–41)
Albumin: 3.9 g/dL (ref 3.5–5.0)
Alkaline Phosphatase: 63 U/L (ref 38–126)
Anion gap: 7 (ref 5–15)
BUN: 15 mg/dL (ref 8–23)
CO2: 29 mmol/L (ref 22–32)
Calcium: 9.2 mg/dL (ref 8.9–10.3)
Chloride: 103 mmol/L (ref 98–111)
Creatinine: 0.78 mg/dL (ref 0.44–1.00)
GFR, Estimated: >60 mL/min (ref >60)
Glucose, Bld: 104 mg/dL — ABNORMAL HIGH (ref 70–99)
Potassium: 4 mmol/L (ref 3.5–5.1)
Sodium: 139 mmol/L (ref 135–145)
Total Bilirubin: 0.3 mg/dL (ref 0.3–1.2)
Total Protein: 6.9 g/dL (ref 6.5–8.1)

## 2022-11-06 MED ORDER — SODIUM CHLORIDE 0.9 % IV SOLN
500.0000 mg/m2 | Freq: Once | INTRAVENOUS | Status: AC
  Administered 2022-11-06: 800 mg via INTRAVENOUS
  Filled 2022-11-06: qty 20

## 2022-11-06 MED ORDER — SODIUM CHLORIDE 0.9 % IV SOLN: Freq: Once | INTRAVENOUS | Status: AC

## 2022-11-06 MED ORDER — SODIUM CHLORIDE 0.9% FLUSH
10.0000 mL | INTRAVENOUS | Status: DC | PRN
  Administered 2022-11-06: 10 mL

## 2022-11-06 MED ORDER — SODIUM CHLORIDE 0.9% FLUSH
10.0000 mL | Freq: Once | INTRAVENOUS | Status: AC
  Administered 2022-11-06: 10 mL

## 2022-11-06 MED ORDER — PROCHLORPERAZINE MALEATE 10 MG PO TABS
10.0000 mg | ORAL_TABLET | Freq: Once | ORAL | Status: AC
  Administered 2022-11-06: 10 mg via ORAL
  Filled 2022-11-06: qty 1

## 2022-11-06 MED ORDER — SODIUM CHLORIDE 0.9 % IV SOLN
200.0000 mg | Freq: Once | INTRAVENOUS | Status: AC
  Administered 2022-11-06: 200 mg via INTRAVENOUS
  Filled 2022-11-06: qty 200

## 2022-11-06 MED ORDER — HEPARIN SOD (PORK) LOCK FLUSH 100 UNIT/ML IV SOLN
500.0000 [IU] | Freq: Once | INTRAVENOUS | Status: AC | PRN
  Administered 2022-11-06: 500 [IU]

## 2022-11-06 NOTE — Assessment & Plan Note (Signed)
Hannah [name] is a 72 year old woman with stage IV non-small cell lung adenocarcinoma with brain metastasis on treatment with maintenance Keytruda and Alimta..  Stage IVb adenocarcinoma of the right upper lobe of lung: Has no clinical signs of lung cancer progression.  Her labs today are normal and I reviewed those with her in detail.  She is tolerating her maintenance therapy with Alimta and Keytruda well and will proceed with treatment today. Brain metastases: She will continue to follow-up with radiation oncology and Dr. Barbaraann Cao.  Her next MRI of the brain will occur on May 23 followed by her appointment with Dr. Barbaraann Cao on May 28. Recent UTI: This resolved after taking a round of antibiotics with Septra.  Hannah [name] will return in 3 weeks for labs, follow-up, and her next treatment.

## 2022-11-06 NOTE — Patient Instructions (Signed)
[facility]  Discharge Instructions: Thank you for choosing [facility] to provide your oncology and hematology care.   If you have a lab appointment with the Cancer Center, please go directly to the Cancer Center and check in at the registration area.   Wear comfortable clothing and clothing appropriate for easy access to any Portacath or PICC line.   We strive to give you quality time with your provider. You may need to reschedule your appointment if you arrive late (15 or more minutes).  Arriving late affects you and other patients whose appointments are after yours.  Also, if you miss three or more appointments without notifying the office, you may be dismissed from the clinic at the provider's discretion.      For prescription refill requests, have your pharmacy contact our office and allow 72 hours for refills to be completed.    Today you received the following chemotherapy and/or immunotherapy agents keytruda , alimta      To help prevent nausea and vomiting after your treatment, we encourage you to take your nausea medication as directed.  BELOW ARE SYMPTOMS THAT SHOULD BE REPORTED IMMEDIATELY: *FEVER GREATER THAN 100.4 F (38 C) OR HIGHER *CHILLS OR SWEATING *NAUSEA AND VOMITING THAT IS NOT CONTROLLED WITH YOUR NAUSEA MEDICATION *UNUSUAL SHORTNESS OF BREATH *UNUSUAL BRUISING OR BLEEDING *URINARY PROBLEMS (pain or burning when urinating, or frequent urination) *BOWEL PROBLEMS (unusual diarrhea, constipation, pain near the anus) TENDERNESS IN MOUTH AND THROAT WITH OR WITHOUT PRESENCE OF ULCERS (sore throat, sores in mouth, or a toothache) UNUSUAL RASH, SWELLING OR PAIN  UNUSUAL VAGINAL DISCHARGE OR ITCHING   Items with * indicate a potential emergency and should be followed up as soon as possible or go to the Emergency Department if any problems should occur.  Please show the CHEMOTHERAPY ALERT CARD or IMMUNOTHERAPY ALERT CARD  at check-in to the Emergency Department and triage nurse.  Should you have questions after your visit or need to cancel or reschedule your appointment, please contact [facility]  Dept: [phone]  and follow the prompts.  Office hours are 8:00 a.m. to 4:30 p.m. Monday - Friday. Please note that voicemails left after 4:00 p.m. may not be returned until the following business day.  We are closed weekends and major holidays. You have access to a nurse at all times for urgent questions. Please call the main number to the clinic Dept: [phone] and follow the prompts.   For any non-urgent questions, you may also contact your provider using MyChart. We now offer e-Visits for anyone 18 and older to request care online for non-urgent symptoms. For details visit [email or web address].   Also download the MyChart app! Go to the app store, search "MyChart", open the app, select [facility], and log in with your MyChart username and password.   

## 2022-11-06 NOTE — Progress Notes (Signed)
[facility] Cancer Follow up:    Pincus Sanes, MD [address] [place] [address]   DIAGNOSIS:  Cancer Staging  Primary cancer of right upper lobe of lung [facility]) Staging form: Lung, AJCC 8th Edition - Clinical: Stage IVB (cT3, cN0, cM1c) - Signed by Si Gaul, MD on 09/20/2020 Stage IV non-small cell lung cancer (T3, N0, M1C) adenocarcinoma.  The patient presented with a and solitary brain metastasis.  The patient was diagnosed in February 2022.   Biomarker Findings Microsatellite status - MS-Stable Tumor Mutational Burden - 8 Muts/Mb Genomic Findings For a complete list of the genes assayed, please refer to the Appendix. KRAS G12V KEAP1 S224F TP53 P151T 7 Disease relevant genes with no reportable alterations: ALK, BRAF, EGFR, ERBB2, MET, RET, ROS1   PDL1 Expression: 50%     PRIOR THERAPY: (per Dr. Asa Lente 10/16/2022 note) 1) SRS to the metastatic brain lesion on 08/23/2020 under the care of Dr. [name] and craniotomy under the care of Dr. Maurice Small scheduled for 08/24/2020. 2) S/p robotic assisted right upper lobectomy with en bloc wedge resection of the right middle lobe and lymph node dissection under the care of Dr. Dorris Fetch on September 24, 2020. 3) status post SRS to a new subcentimeter brain lesions under the care of Dr. Mitzi Hansen. 4) SRS to brain metastasis. 5) Systemic chemotherapy with carboplatin for AUC of 5, Alimta 500 Mg/M2 and Keytruda 200 mg IV every 3 weeks x 4 cycles.  11/07/2020-01/17/2021 6) Maintenance Alimta and Keytruda every 3 weeks beginning 02/07/2021  CURRENT THERAPY: Alimta/Keytruda  INTERVAL HISTORY: LYNESE FISCHEL 72 y.o. female returns for follow-up of her lung cancer prior to receiving maintenance treatment with Alimta and Keytruda.  Her most recent restaging scans with CT chest abdomen and pelvis occurred on October 14, 2022 which showed no concerning findings for disease progression.  At her last visit she had a urinary  tract infection.  She was treated with antibiotics and no longer has any discomfort in her lower back.  She tolerated the antibiotics well.  She continues to follow a low with Dr. Barbaraann Cao and radiation oncology regarding her brain metastasis.  Her next MRI of the brain will occur on Nov 20, 2022 followed by follow-up with Dr. Barbaraann Cao on Nov 25, 2022.  Patient Active Problem List   Diagnosis Date Noted   Infected skin lesion 09/11/2022   Port-A-Cath in place 08/14/2022   Pain and swelling of left ankle 06/27/2022   Achilles tendonitis 06/27/2022   Rash 06/27/2022   Nasal congestion 03/21/2022   PAD (peripheral artery disease) (HCC) 03/08/2022   Viral illness 03/06/2022   Bilateral shoulder pain 12/11/2021   Fatigue 12/11/2021   Metastatic lung cancer (metastasis from lung to other site) [facility]) 09/18/2021   Chest pain 08/30/2021   Frequency of urination 08/30/2021   Vasogenic brain edema (HCC) 04/22/2021   Seizure (HCC) 04/22/2021   Nonrheumatic aortic valve insufficiency 04/18/2021   Allergic rhinitis 02/21/2021   Encounter for antineoplastic chemotherapy 10/23/2020   Encounter for antineoplastic immunotherapy 10/23/2020   Anemia 10/19/2020   Sleep difficulties 10/19/2020   S/P robot-assisted surgical procedure 09/24/2020   Aortic atherosclerosis (HCC) 09/12/2020   Status post craniotomy 08/24/2020   Hematochezia 08/19/2020   Rectal bleeding 08/17/2020   Primary cancer of right upper lobe of lung (HCC) 08/07/2020   Primary malignant neoplasm of lung with metastasis to brain (HCC) 08/06/2020   Memory changes 07/24/2020   Non-recurrent unilateral inguinal hernia without obstruction or gangrene  07/24/2020   Pain of right clavicle 09/12/2019   Nonspecific abnormal electrocardiogram (ECG) (EKG) 10/20/2018   Depression 07/17/2018   Primary osteoarthritis of right knee 06/04/2018   Bilateral carotid artery stenosis 09/24/2017   Aortic valve sclerosis 09/24/2017   Vocal cord polyps  09/07/2017   Dysphagia 09/07/2017   Diabetes mellitus without complication (HCC) 03/09/2017   GERD (gastroesophageal reflux disease) 02/27/2016   Anxiety 02/27/2016   S/P total knee replacement 02/13/2014   RBBB 02/08/2014   PVD - bilateral 60-79% carotid strenosis 02/08/2014   Mixed hyperlipidemia 11/29/2013   Carpal tunnel syndrome, bilateral 11/23/2013   Murmur- mild -mod AR, mild MR 11/10/2013   Constipation 12/16/2012    is allergic to amlodipine, chantix [varenicline tartrate], clarithromycin, lisinopril, simvastatin, wellbutrin [bupropion hcl], lipitor [atorvastatin], and sertraline.  MEDICAL HISTORY: Past Medical History:  Diagnosis Date   Allergy    Anemia    Anxiety    Arthritis    Carpal tunnel syndrome    Constipation    senna C stool softeners help    Depression    Diverticulosis    Dyslipidemia    External hemorrhoids    GERD (gastroesophageal reflux disease)    Heart murmur    mild-moderate AR   Hiatal hernia    Hyperlipidemia    on meds    Hypertension    Internal hemorrhoids    lung ca with brain mets 07/2020   Osteoarthritis    Pre-diabetes    PVD (peripheral vascular disease) (HCC)    moderate carotid disease   RBBB    Smoker    Vocal cord polyps     SURGICAL HISTORY: Past Surgical History:  Procedure Laterality Date   ABDOMINAL HYSTERECTOMY  1994   APPLICATION OF CRANIAL NAVIGATION N/A 08/24/2020   Procedure: APPLICATION OF CRANIAL NAVIGATION;  Surgeon: Jadene Pierini, MD;  Location: [facility] OR;  Service: Neurosurgery;  Laterality: N/A;   COLONOSCOPY     COLONOSCOPY W/ POLYPECTOMY  2009   CRANIOTOMY Left 08/24/2020   Procedure: Left Craniotomy for Tumor Resection with Brainlab;  Surgeon: Jadene Pierini, MD;  Location: [facility] OR;  Service: Neurosurgery;  Laterality: Left;   HEMORRHOID SURGERY     INTERCOSTAL NERVE BLOCK Right 09/24/2020   Procedure: INTERCOSTAL NERVE BLOCK;  Surgeon: Loreli Slot, MD;  Location: [facility] OR;  Service:  Thoracic;  Laterality: Right;   IR IMAGING GUIDED PORT INSERTION  05/15/2022   JOINT REPLACEMENT     LYMPH NODE DISSECTION Right 09/24/2020   Procedure: LYMPH NODE DISSECTION;  Surgeon: Loreli Slot, MD;  Location: [facility] OR;  Service: Thoracic;  Laterality: Right;   POLYPECTOMY     right total knee arthroplasty     Dr. Sherle Poe 06-04-18   SPINE SURGERY  08/16/2009   TOTAL KNEE ARTHROPLASTY Left 02/13/2014   Procedure: TOTAL KNEE ARTHROPLASTY;  Surgeon: Harvie Junior, MD;  Location: [facility] OR;  Service: Orthopedics;  Laterality: Left;   TOTAL KNEE ARTHROPLASTY Right 06/04/2018   Procedure: RIGHT TOTAL KNEE ARTHROPLASTY;  Surgeon: Jodi Geralds, MD;  Location: [facility] ORS;  Service: Orthopedics;  Laterality: Right;  Adductor Block   TUBAL LIGATION      SOCIAL HISTORY: Social History   Socioeconomic History   Marital status: Married    Spouse name: Not on file   Number of children: 2   Years of education: Not on file   Highest education level: 12th grade  Occupational History   Not on file  Tobacco Use   Smoking  status: Former    Packs/day: 0.25    Years: 40.00    Additional pack years: 0.00    Total pack years: 10.00    Types: Cigarettes    Quit date: 07/09/2020    Years since quitting: 2.3    Passive exposure: Never   Smokeless tobacco: Never   Tobacco comments:    PACK WILL LAST 3 days  Vaping Use   Vaping Use: Never used  Substance and Sexual Activity   Alcohol use: No   Drug use: No   Sexual activity: Not Currently  Other Topics Concern   Not on file  Social History Narrative   Not on file   Social Determinants of Health   Financial Resource Strain: Low Risk  (09/19/2022)   Overall Financial Resource Strain (CARDIA)    Difficulty of Paying Living Expenses: Not hard at all  Food Insecurity: No Food Insecurity (09/24/2022)   Hunger Vital Sign    Worried About Running Out of Food in the Last Year: Never true    Ran Out of Food in the Last Year: Never true  Transportation  Needs: No Transportation Needs (09/24/2022)   PRAPARE - Administrator, Civil Service (Medical): No    Lack of Transportation (Non-Medical): No  Physical Activity: Insufficiently Active (09/19/2022)   Exercise Vital Sign    Days of Exercise per Week: 3 days    Minutes of Exercise per Session: 30 min  Stress: No Stress Concern Present (09/19/2022)   [employer] of Occupational Health - Occupational Stress Questionnaire    Feeling of Stress : Only a little  Social Connections: Socially Integrated (09/19/2022)   Social Connection and Isolation Panel [NHANES]    Frequency of Communication with Friends and Family: More than three times a week    Frequency of Social Gatherings with Friends and Family: More than three times a week    Attends Religious Services: More than 4 times per year    Active Member of [employer] or Organizations: Yes    Attends Banker Meetings: 1 to 4 times per year    Marital Status: Married  Catering manager Violence: Not At Risk (04/14/2022)   Humiliation, Afraid, Rape, and Kick questionnaire    Fear of Current or Ex-Partner: No    Emotionally Abused: No    Physically Abused: No    Sexually Abused: No    FAMILY HISTORY: Family History  Problem Relation Age of Onset   Cancer Father    Prostate cancer Father    Diabetes Sister    Cancer Sister    Stroke Maternal Grandfather    Colitis Maternal Aunt    Breast cancer Maternal Aunt    Colon cancer Neg Hx    Colon polyps Neg Hx    Rectal cancer Neg Hx    Stomach cancer Neg Hx    Esophageal cancer Neg Hx     Review of Systems  Constitutional:  Negative for appetite change, chills, fatigue, fever and unexpected weight change.  HENT:   Negative for hearing loss, lump/mass and trouble swallowing.   Eyes:  Negative for eye problems and icterus.  Respiratory:  Negative for chest tightness, cough and shortness of breath.   Cardiovascular:  Negative for chest pain, leg swelling and  palpitations.  Gastrointestinal:  Negative for abdominal distention, abdominal pain, constipation, diarrhea, nausea and vomiting.  Endocrine: Negative for hot flashes.  Genitourinary:  Negative for difficulty urinating.   Musculoskeletal:  Negative for arthralgias.  Skin:  Negative for itching and rash.  Neurological:  Negative for dizziness, extremity weakness, headaches and numbness.  Hematological:  Negative for adenopathy. Does not bruise/bleed easily.  Psychiatric/Behavioral:  Negative for depression. The patient is not nervous/anxious.       PHYSICAL EXAMINATION   Onc Performance Status - 11/06/22 1150       ECOG Perf Status   ECOG Perf Status Ambulatory and capable of all selfcare but unable to carry out any work activities.  Up and about more than 50% of waking hours      KPS SCALE   KPS % SCORE Normal activity with effort, some s/s of disease             Vitals:   11/06/22 1125  BP: (!) 153/67  Pulse: 64  Resp: 17  Temp: 98.1 F (36.7 C)  SpO2: 100%    Physical Exam Constitutional:      General: She is not in acute distress.    Appearance: Normal appearance. She is not toxic-appearing.  HENT:     Head: Normocephalic and atraumatic.  Eyes:     General: No scleral icterus. Cardiovascular:     Rate and Rhythm: Normal rate and regular rhythm.     Pulses: Normal pulses.     Heart sounds: Normal heart sounds.  Pulmonary:     Effort: Pulmonary effort is normal.     Breath sounds: Normal breath sounds.  Abdominal:     General: Abdomen is flat. Bowel sounds are normal. There is no distension.     Palpations: Abdomen is soft.     Tenderness: There is no abdominal tenderness.  Musculoskeletal:        General: No swelling.     Cervical back: Neck supple.  Lymphadenopathy:     Cervical: No cervical adenopathy.  Skin:    General: Skin is warm and dry.     Findings: No rash.  Neurological:     General: No focal deficit present.     Mental Status: She is  alert.  Psychiatric:        Mood and Affect: Mood normal.        Behavior: Behavior normal.     LABORATORY DATA:  CBC    Component Value Date/Time   WBC 6.7 11/06/2022 1105   WBC 3.6 (L) 03/06/2022 1139   RBC 3.94 11/06/2022 1105   HGB 11.6 (L) 11/06/2022 1105   HCT 36.0 11/06/2022 1105   PLT 265 11/06/2022 1105   MCV 91.4 11/06/2022 1105   MCV 80.8 09/16/2015 1138   MCH 29.4 11/06/2022 1105   MCHC 32.2 11/06/2022 1105   RDW 13.1 11/06/2022 1105   LYMPHSABS 1.6 11/06/2022 1105   MONOABS 0.7 11/06/2022 1105   EOSABS 0.0 11/06/2022 1105   BASOSABS 0.0 11/06/2022 1105    CMP     Component Value Date/Time   NA 139 11/06/2022 1105   K 4.0 11/06/2022 1105   CL 103 11/06/2022 1105   CO2 29 11/06/2022 1105   GLUCOSE 104 (H) 11/06/2022 1105   BUN 15 11/06/2022 1105   CREATININE 0.78 11/06/2022 1105   CREATININE 0.79 03/14/2020 1016   CALCIUM 9.2 11/06/2022 1105   PROT 6.9 11/06/2022 1105   ALBUMIN 3.9 11/06/2022 1105   AST 21 11/06/2022 1105   ALT 14 11/06/2022 1105   ALKPHOS 63 11/06/2022 1105   BILITOT 0.3 11/06/2022 1105   GFRNONAA >60 11/06/2022 1105   GFRNONAA 75 03/14/2020 1016   GFRAA 87 03/14/2020 1016  ASSESSMENT and THERAPY PLAN:   Primary cancer of right upper lobe of lung (HCC) Brilynn is a 72 year old woman with stage IV non-small cell lung adenocarcinoma with brain metastasis on treatment with maintenance Keytruda and Alimta..  Stage IVb adenocarcinoma of the right upper lobe of lung: Has no clinical signs of lung cancer progression.  Her labs today are normal and I reviewed those with her in detail.  She is tolerating her maintenance therapy with Alimta and Keytruda well and will proceed with treatment today. Brain metastases: She will continue to follow-up with radiation oncology and Dr. Barbaraann Cao.  Her next MRI of the brain will occur on May 23 followed by her appointment with Dr. Barbaraann Cao on May 28. Recent UTI: This resolved after taking a  round of antibiotics with Septra.  Inika will return in 3 weeks for labs, follow-up, and her next treatment.   All questions were answered. The patient knows to call the clinic with any problems, questions or concerns. We can certainly see the patient much sooner if necessary.  Total encounter time:20 minutes*in face-to-face visit time, chart review, lab review, care coordination, order entry, and documentation of the encounter time.    Lillard Anes, NP 11/06/22 12:25 PM Medical Oncology and Hematology [facility] [address] [place], [place] [address] Tel. [phone]    Fax. [phone]  *Total Encounter Time as defined by the Centers for Medicare and Medicaid Services includes, in addition to the face-to-face time of a patient visit (documented in the note above) non-face-to-face time: obtaining and reviewing outside history, ordering and reviewing medications, tests or procedures, care coordination (communications with other health care professionals or caregivers) and documentation in the medical record.

## 2022-11-07 ENCOUNTER — Other Ambulatory Visit: Payer: Self-pay

## 2022-11-10 ENCOUNTER — Other Ambulatory Visit: Payer: Self-pay | Admitting: Internal Medicine

## 2022-11-10 DIAGNOSIS — C3490 Malignant neoplasm of unspecified part of unspecified bronchus or lung: Secondary | ICD-10-CM

## 2022-11-15 ENCOUNTER — Other Ambulatory Visit: Payer: Self-pay | Admitting: Internal Medicine

## 2022-11-17 ENCOUNTER — Encounter: Payer: Self-pay | Admitting: Internal Medicine

## 2022-11-19 MED ORDER — HEPARIN SOD (PORK) LOCK FLUSH 100 UNIT/ML IV SOLN: 500.0000 [IU] | Freq: Once | INTRAVENOUS | Status: DC

## 2022-11-19 MED ORDER — SODIUM CHLORIDE 0.9% FLUSH: 10.0000 mL | INTRAVENOUS | Status: DC | PRN

## 2022-11-20 ENCOUNTER — Ambulatory Visit
Admission: RE | Admit: 2022-11-20 | Discharge: 2022-11-20 | Disposition: A | Discharge status: Home / Self Care | Payer: Medicare HMO | Source: Ambulatory Visit | Attending: Internal Medicine | Admitting: Internal Medicine

## 2022-11-20 ENCOUNTER — Other Ambulatory Visit: Payer: Medicare HMO

## 2022-11-20 DIAGNOSIS — C3490 Malignant neoplasm of unspecified part of unspecified bronchus or lung: Secondary | ICD-10-CM

## 2022-11-20 DIAGNOSIS — G9389 Other specified disorders of brain: Secondary | ICD-10-CM | POA: Diagnosis not present

## 2022-11-20 MED ORDER — GADOPICLENOL 0.5 MMOL/ML IV SOLN
6.0000 mL | Freq: Once | INTRAVENOUS | Status: AC | PRN
  Administered 2022-11-20: 6 mL via INTRAVENOUS

## 2022-11-25 ENCOUNTER — Inpatient Hospital Stay: Payer: Medicare HMO | Admitting: Internal Medicine

## 2022-11-25 VITALS — BP 140/75 | HR 80 | Temp 97.9°F | Resp 14 | Wt 130.4 lb

## 2022-11-25 DIAGNOSIS — K219 Gastro-esophageal reflux disease without esophagitis: Secondary | ICD-10-CM | POA: Diagnosis not present

## 2022-11-25 DIAGNOSIS — F419 Anxiety disorder, unspecified: Secondary | ICD-10-CM | POA: Diagnosis not present

## 2022-11-25 DIAGNOSIS — C3490 Malignant neoplasm of unspecified part of unspecified bronchus or lung: Secondary | ICD-10-CM | POA: Diagnosis not present

## 2022-11-25 DIAGNOSIS — Z5112 Encounter for antineoplastic immunotherapy: Secondary | ICD-10-CM | POA: Diagnosis not present

## 2022-11-25 DIAGNOSIS — M25472 Effusion, left ankle: Secondary | ICD-10-CM | POA: Diagnosis not present

## 2022-11-25 DIAGNOSIS — R569 Unspecified convulsions: Secondary | ICD-10-CM

## 2022-11-25 DIAGNOSIS — C7931 Secondary malignant neoplasm of brain: Secondary | ICD-10-CM

## 2022-11-25 DIAGNOSIS — E119 Type 2 diabetes mellitus without complications: Secondary | ICD-10-CM | POA: Diagnosis not present

## 2022-11-25 DIAGNOSIS — Z5111 Encounter for antineoplastic chemotherapy: Secondary | ICD-10-CM | POA: Diagnosis not present

## 2022-11-25 DIAGNOSIS — C3411 Malignant neoplasm of upper lobe, right bronchus or lung: Secondary | ICD-10-CM | POA: Diagnosis not present

## 2022-11-25 DIAGNOSIS — E782 Mixed hyperlipidemia: Secondary | ICD-10-CM | POA: Diagnosis not present

## 2022-11-25 NOTE — Progress Notes (Signed)
[facility] [address]  [place], [place] [address] [phone]   Interval Evaluation  Date of Service: 11/25/22 Patient Name: Hannah [name] Patient MRN: [id number] Patient DOB: [birth year] Provider: Henreitta Leber, MD  Identifying Statement:  Hannah [name] is a 72 y.o. female with brain metastasis   Primary Cancer:  Oncology History  Primary cancer of right upper lobe of lung (HCC)  08/07/2020 Initial Diagnosis   Primary cancer of right upper lobe of lung (HCC)   09/20/2020 Cancer Staging   Staging form: Lung, AJCC 8th Edition - Clinical: Stage IVB (cT3, cN0, cM1c) - Signed by Si Gaul, MD on 09/20/2020   11/08/2020 - 01/16/2022 Chemotherapy   Patient is on Treatment Plan : LUNG CARBOplatin / Pemetrexed / Pembrolizumab q21d Induction x 4 cycles / Maintenance Pemetrexed + Pembrolizumab     11/16/2020 -  Chemotherapy   Patient is on Treatment Plan : LUNG Carboplatin (5) + Pemetrexed (500) + Pembrolizumab (200) D1 q21d Induction x 4 cycles / Maintenance Pemetrexed (500) + Pembrolizumab (200) D1 q21d      Oncologic History 08/23/20: Pre-op SRS to L frontal mass Hannah [name]) 08/24/20: Craniotomy, resection (Ostergard) 11/29/20: Salvage SRS x2 Hannah [name]) 10/14/21: L frontoparietal progression, undergoes LITT at [facility]) 11/18/21: Path is adenocarcinoma, completes 25/5 SRS Hannah [name])  Interval History:  YAKELINE GRAJEWSKI presents today for follow up after recent MRI brain.  No new or progressive changes today.  Last seizure was on 1/30 per telephone note. She continues on keppra 500mg  BID and vimpat without further seizures.  Continues on Pem/Pem with Dr. Arbutus Ped.  H+P (08/02/20) Patient presented to medical attention this past week with several days rapidly progressive speech impairment and right sided weakness.  She and her family describe difficulty putting complete sentences together, impaired use of right arm (for using fork, zipper,  brushing teeth), and dragging of the right leg while walking.  She never needed assistance to walk.  CNS imaging demonstrated left frontal mass, and decadron was started over the weekend 4mg  twice per day.  She feels improved with regards to the right sided weakness with the steroids.  Otherwise no seizures, headaches.  Medications: Current Outpatient Medications on File Prior to Visit  Medication Sig Dispense Refill   acetaminophen (TYLENOL) 325 MG tablet Take 325 mg by mouth every 6 (six) hours as needed for moderate pain.     ALPRAZolam (XANAX XR) 0.5 MG 24 hr tablet Take 1 tablet (0.5 mg total) by mouth in the morning. 30 tablet 5   ALPRAZolam (XANAX) 0.5 MG tablet Take 1 tablet (0.5 mg total) by mouth 3 (three) times daily as needed for anxiety. 90 tablet 0   aspirin 81 MG tablet Take 1 tablet (81 mg total) by mouth daily. Restart on 08/31/20 30 tablet    bisacodyl (DULCOLAX) 5 MG EC tablet Take 5 mg by mouth daily as needed for moderate constipation.     carboxymethylcellulose (REFRESH PLUS) 0.5 % SOLN Place 1-2 drops into both eyes 3 (three) times daily as needed (dry eyes).     Cholecalciferol (D3 PO) Take 1 capsule by mouth daily.     citalopram (CELEXA) 10 MG tablet Take 1 tablet (10 mg total) by mouth daily. 90 tablet 0   fluticasone (FLONASE) 50 MCG/ACT nasal spray USE 2 SPRAYS IN EACH NOSTRIL EVERY DAY 48 g 3   folic acid (FOLVITE) 1 MG tablet Take 1 tablet by mouth once daily 30 tablet 0  ketoconazole (NIZORAL) 2 % cream APPLY 1 APPLICATION TOPICALLY DAILY 30 g 0   Lacosamide 100 MG TABS Take 1 tablet (100 mg total) by mouth 2 (two) times daily. 60 tablet 3   levETIRAcetam (KEPPRA) 100 MG/ML solution TAKE 15 ML BY MOUTH  TWICE DAILY 473 mL 0   lidocaine-prilocaine (EMLA) cream Apply 1 Application topically as needed. 30 g 2   loratadine (CLARITIN) 10 MG tablet Take 10 mg by mouth daily as needed for allergies.     magnesium hydroxide (MILK OF MAGNESIA) 800 MG/5ML suspension Take 30  mLs by mouth daily as needed for constipation.     Menthol, Topical Analgesic, (BIOFREEZE EX) Apply 1 application topically as needed (pain).     mirtazapine (REMERON) 30 MG tablet Take 30 mg by mouth at bedtime.     olopatadine (PATANOL) 0.1 % ophthalmic solution Place 1 drop into both eyes 2 (two) times daily as needed for allergies.     omeprazole (PRILOSEC) 40 MG capsule Take 1 capsule (40 mg total) by mouth daily. 90 capsule 1   polyethylene glycol (MIRALAX / GLYCOLAX) 17 g packet Take 17 g by mouth 2 (two) times daily. 72 each 0   pravastatin (PRAVACHOL) 40 MG tablet TAKE 1 TABLET EVERY EVENING 90 tablet 3   Probiotic Product (PROBIOTIC DAILY PO) Take 1 capsule by mouth daily.     prochlorperazine (COMPAZINE) 10 MG tablet TAKE 1 TABLET BY MOUTH EVERY 6 HOURS AS NEEDED FOR NAUSEA OR VOMITING 30 tablet 0   simethicone (MYLICON) 80 MG chewable tablet Chew 1 tablet (80 mg total) by mouth 4 (four) times daily as needed for flatulence (Bloating). 30 tablet 0   sulfamethoxazole-trimethoprim (BACTRIM DS) 800-160 MG tablet Take 1 tablet by mouth 2 (two) times daily. 6 tablet 0   No current facility-administered medications on file prior to visit.    Allergies:  Allergies  Allergen Reactions   Amlodipine Swelling and Rash    Rash, swelling   Chantix [Varenicline Tartrate] Shortness Of Breath, Swelling and Other (See Comments)    Tongue swell,sob   Clarithromycin Rash   Lisinopril Hives   Simvastatin Hives   Wellbutrin [Bupropion Hcl] Hives   Lipitor [Atorvastatin]     Dizziness per patient   Sertraline     Makes her feel like she is going to kill someone   Past Medical History:  Past Medical History:  Diagnosis Date   Allergy    Anemia    Anxiety    Arthritis    Carpal tunnel syndrome    Constipation    senna C stool softeners help    Depression    Diverticulosis    Dyslipidemia    External hemorrhoids    GERD (gastroesophageal reflux disease)    Heart murmur     mild-moderate AR   Hiatal hernia    Hyperlipidemia    on meds    Hypertension    Internal hemorrhoids    lung ca with brain mets 07/2020   Osteoarthritis    Pre-diabetes    PVD (peripheral vascular disease) (HCC)    moderate carotid disease   RBBB    Smoker    Vocal cord polyps    Past Surgical History:  Past Surgical History:  Procedure Laterality Date   ABDOMINAL HYSTERECTOMY  1994   APPLICATION OF CRANIAL NAVIGATION N/A 08/24/2020   Procedure: APPLICATION OF CRANIAL NAVIGATION;  Surgeon: Jadene Pierini, MD;  Location: [facility] OR;  Service: Neurosurgery;  Laterality: N/A;  COLONOSCOPY     COLONOSCOPY W/ POLYPECTOMY  2009   CRANIOTOMY Left 08/24/2020   Procedure: Left Craniotomy for Tumor Resection with Brainlab;  Surgeon: Jadene Pierini, MD;  Location: [facility] OR;  Service: Neurosurgery;  Laterality: Left;   HEMORRHOID SURGERY     INTERCOSTAL NERVE BLOCK Right 09/24/2020   Procedure: INTERCOSTAL NERVE BLOCK;  Surgeon: Loreli Slot, MD;  Location: [facility] OR;  Service: Thoracic;  Laterality: Right;   IR IMAGING GUIDED PORT INSERTION  05/15/2022   JOINT REPLACEMENT     LYMPH NODE DISSECTION Right 09/24/2020   Procedure: LYMPH NODE DISSECTION;  Surgeon: Loreli Slot, MD;  Location[facility] OR;  Service: Thoracic;  Laterality: Right;   POLYPECTOMY     right total knee arthroplasty     Dr. Sherle Poe 06-04-18   SPINE SURGERY  08/16/2009   TOTAL KNEE ARTHROPLASTY Left 02/13/2014   Procedure: TOTAL KNEE ARTHROPLASTY;  Surgeon: Harvie Junior, MD;  Location: [facility] OR;  Service: Orthopedics;  Laterality: Left;   TOTAL KNEE ARTHROPLASTY Right 06/04/2018   Procedure: RIGHT TOTAL KNEE ARTHROPLASTY;  Surgeon: Jodi Geralds, MD;  Location: [facility] ORS;  Service: Orthopedics;  Laterality: Right;  Adductor Block   TUBAL LIGATION     Social History:  Social History   Socioeconomic History   Marital status: Married    Spouse name: Not on file   Number of children: 2   Years of education: Not on  file   Highest education level: 12th grade  Occupational History   Not on file  Tobacco Use   Smoking status: Former    Packs/day: 0.25    Years: 40.00    Additional pack years: 0.00    Total pack years: 10.00    Types: Cigarettes    Quit date: 07/09/2020    Years since quitting: 2.3    Passive exposure: Never   Smokeless tobacco: Never   Tobacco comments:    PACK WILL LAST 3 days  Vaping Use   Vaping Use: Never used  Substance and Sexual Activity   Alcohol use: No   Drug use: No   Sexual activity: Not Currently  Other Topics Concern   Not on file  Social History Narrative   Not on file   Social Determinants of Health   Financial Resource Strain: Low Risk  (09/19/2022)   Overall Financial Resource Strain (CARDIA)    Difficulty of Paying Living Expenses: Not hard at all  Food Insecurity: No Food Insecurity (09/24/2022)   Hunger Vital Sign    Worried About [name] Out of Food in the Last Year: Never true    Ran Out of Food in the Last Year: Never true  Transportation Needs: No Transportation Needs (09/24/2022)   PRAPARE - Administrator, Civil Service (Medical): No    Lack of Transportation (Non-Medical): No  Physical Activity: Insufficiently Active (09/19/2022)   Exercise Vital Sign    Days of Exercise per Week: 3 days    Minutes of Exercise per Session: 30 min  Stress: No Stress Concern Present (09/19/2022)   [employer] of Occupational Health - Occupational Stress Questionnaire    Feeling of Stress : Only a little  Social Connections: Socially Integrated (09/19/2022)   Social Connection and Isolation Panel [NHANES]    Frequency of Communication with Friends and Family: More than three times a week    Frequency of Social Gatherings with Friends and Family: More than three times a week    Attends Religious Services:  More than 4 times per year    Active Member of Clubs or Organizations: Yes    Attends Club or Organization Meetings: 1 to 4 times per year     Marital Status: Married  Catering manager Violence: Not At Risk (04/14/2022)   Humiliation, Afraid, Rape, and Kick questionnaire    Fear of Current or Ex-Partner: No    Emotionally Abused: No    Physically Abused: No    Sexually Abused: No   Family History:  Family History  Problem Relation Age of Onset   Cancer Father    Prostate cancer Father    Diabetes Sister    Cancer Sister    Stroke Maternal Grandfather    Colitis Maternal Aunt    Breast cancer Maternal Aunt    Colon cancer Neg Hx    Colon polyps Neg Hx    Rectal cancer Neg Hx    Stomach cancer Neg Hx    Esophageal cancer Neg Hx     Review of Systems: Constitutional: Doesn't report fevers, chills or abnormal weight loss Eyes: Doesn't report blurriness of vision Ears, nose, mouth, throat, and face: Doesn't report sore throat Respiratory: Doesn't report cough, dyspnea or wheezes Cardiovascular: Doesn't report palpitation, chest discomfort  Gastrointestinal:  Doesn't report nausea, constipation, diarrhea GU: Doesn't report incontinence Skin: Doesn't report skin rashes Neurological: Per HPI Musculoskeletal: Doesn't report joint pain Behavioral/Psych: ++anxiety  Physical Exam: Wt Readings from Last 3 Encounters:  11/06/22 126 lb 12.8 oz (57.5 kg)  10/22/22 130 lb (59 kg)  10/16/22 133 lb 11.2 oz (60.6 kg)   Temp Readings from Last 3 Encounters:  11/06/22 98.1 F (36.7 C) (Temporal)  10/16/22 97.6 F (36.4 C) (Oral)  10/16/22 99.8 F (37.7 C) (Oral)   BP Readings from Last 3 Encounters:  11/06/22 (!) 147/66  11/06/22 (!) 153/67  10/22/22 130/76   Pulse Readings from Last 3 Encounters:  11/06/22 65  11/06/22 64  10/22/22 95     KPS: 80. General: Alert, cooperative, pleasant, in no acute distress Head: Normal EENT: No conjunctival injection or scleral icterus.  Lungs: Resp effort normal Cardiac: Regular rate Abdomen: Non-distended abdomen Skin: No rashes cyanosis or petechiae. Extremities: No  clubbing or edema  Neurologic Exam: Mental Status: Awake, alert, attentive to examiner. Oriented to self and environment. Language is intact with regards to fluency, comprehension.  Psychomotor slowing, impaired recall. Cranial Nerves: Visual acuity is grossly normal. Visual fields are full. Extra-ocular movements intact. No ptosis. Face is symmetric Motor: Tone and bulk are normal. Power is 5/5 throughout. Reflexes are symmetric, no pathologic reflexes present.  Sensory: Intact to light touch Gait: Independent  Labs: I have reviewed the data as listed    Component Value Date/Time   NA 139 11/06/2022 1105   K 4.0 11/06/2022 1105   CL 103 11/06/2022 1105   CO2 29 11/06/2022 1105   GLUCOSE 104 (H) 11/06/2022 1105   BUN 15 11/06/2022 1105   CREATININE 0.78 11/06/2022 1105   CREATININE 0.79 03/14/2020 1016   CALCIUM 9.2 11/06/2022 1105   PROT 6.9 11/06/2022 1105   ALBUMIN 3.9 11/06/2022 1105   AST 21 11/06/2022 1105   ALT 14 11/06/2022 1105   ALKPHOS 63 11/06/2022 1105   BILITOT 0.3 11/06/2022 1105   GFRNONAA >60 11/06/2022 1105   GFRNONAA 75 03/14/2020 1016   GFRAA 87 03/14/2020 1016   Lab Results  Component Value Date   WBC 6.7 11/06/2022   NEUTROABS 4.3 11/06/2022   HGB 11.6 (L)  11/06/2022   HCT 36.0 11/06/2022   MCV 91.4 11/06/2022   PLT 265 11/06/2022    Imaging:  [facility] Clinician Interpretation: I have personally reviewed the CNS images as listed.  My interpretation, in the context of the patient's clinical presentation, is treatment effect vs true progression  MR BRAIN W WO CONTRAST  Result Date: 11/21/2022 CLINICAL DATA:  72 year old female with metastatic lung cancer. Left frontal metastasis SRS and resection in 2022, two additional lesions treated with SRS that year. LITT therapy for progression/recurrence at the left parietal/occipital treatment area in April 2023. And subsequent post treatment SRS to that area in May 2023. Recurrent enhancement of a 3 mm  previously treated right temporal lesion in October 2023. Subsequent encounter. EXAM: MRI HEAD WITHOUT AND WITH CONTRAST TECHNIQUE: Multiplanar, multiecho pulse sequences of the brain and surrounding structures were obtained without and with intravenous contrast. CONTRAST:  6 mL Vueway COMPARISON:  07/25/2022, 04/25/2022 and earlier. FINDINGS: Brain: Enhancement along the posterior right temporal lobe appears mildly progressed since 04/25/2022 on series 13, image 88. The enhancement here appears larger on both black blood and MPRAGE postcontrast images. Likewise, a small area/subtle associated T2/FLAIR hyperintensity is mild but increased on series 10, image 34. There is no regional mass effect. Contralateral triangular area of enhancement in the left occipital lobe with overlying skull defect remains stable since 04/25/2022, up to about 18 mm long axis. Regional T2/FLAIR hyperintensity remains stable, no regional mass effect. Stable hemosiderin. Left superior frontal gyrus patchy and stellate enhancement remains stable with overlying craniotomy. Regional T2 and FLAIR hyperintensity is stable with no regional mass effect. Stable hemosiderin. No new enhancing lesion.  No suspicious dural thickening. No superimposed restricted diffusion to suggest acute infarction. No midline shift, acute intracranial hemorrhage. Cervicomedullary junction and pituitary are within normal limits. Stable gray and white matter signal elsewhere. Stable underlying cerebral volume. Vascular: Major intracranial vascular flow voids are stable. Skull and upper cervical spine: Postoperative skull defects are stable. Background bone marrow signal within normal limits. Negative for age visible cervical spine. Visible spinal cord appears negative. Sinuses/Orbits: And negative, mild paranasal sinus mucosal thickening. Stable Other: Stable and negative visible internal auditory structures. Stable mild right mastoid effusion. Stable visible scalp and  face. IMPRESSION: 1. The previously treated posterior right temporal cortical lesion shows mild but increasing enhancement and FLAIR hyperintensity. No associated mass effect. Attention directed on follow-up. 2. Continued stable treated left frontal and left parietal/occipital lesions. 3. No new metastatic disease, new intracranial abnormality. Electronically Signed   By: Odessa Fleming M.D.   On: 11/21/2022 09:35      Assessment/Plan Primary malignant neoplasm of lung with metastasis to brain [facility])  Seizure (HCC)  Lashunta N Emberton is clinically stable today.  MRI also demonstrates subtle and slow progression of treated right temporal metastasis, still only 3-53mm.  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  Will continue MRI surveillance at this time given small change and lack of symptoms.  Recommended continuing Vimpat 100mg  BID and Keppra 500mg  BID liquid.  Steroids should remain off.  Will con't systemic therapy with Dr. Arbutus Ped.  We appreciate the opportunity to participate in the care of [name].    We ask that [name] return to clinic in 4 months following next brain MRI, or sooner as needed.  All questions were answered. The patient knows to call the clinic with any problems, questions or concerns. No barriers to learning were detected.  The total time spent in the  encounter was 30 minutes and more than 50% was on counseling and review of test results   Henreitta Leber, MD Medical Director of Neuro-Oncology [facility] 11/25/22 11:10 AM

## 2022-11-27 ENCOUNTER — Inpatient Hospital Stay: Payer: Medicare HMO

## 2022-11-27 ENCOUNTER — Inpatient Hospital Stay: Payer: Medicare HMO | Admitting: Internal Medicine

## 2022-11-27 VITALS — BP 136/81 | HR 68 | Temp 99.1°F | Resp 17 | Ht 61.52 in | Wt 131.5 lb

## 2022-11-27 DIAGNOSIS — C3490 Malignant neoplasm of unspecified part of unspecified bronchus or lung: Secondary | ICD-10-CM

## 2022-11-27 DIAGNOSIS — M25472 Effusion, left ankle: Secondary | ICD-10-CM | POA: Diagnosis not present

## 2022-11-27 DIAGNOSIS — C3411 Malignant neoplasm of upper lobe, right bronchus or lung: Secondary | ICD-10-CM

## 2022-11-27 DIAGNOSIS — Z5111 Encounter for antineoplastic chemotherapy: Secondary | ICD-10-CM | POA: Diagnosis not present

## 2022-11-27 DIAGNOSIS — C7931 Secondary malignant neoplasm of brain: Secondary | ICD-10-CM | POA: Diagnosis not present

## 2022-11-27 DIAGNOSIS — C3491 Malignant neoplasm of unspecified part of right bronchus or lung: Secondary | ICD-10-CM

## 2022-11-27 DIAGNOSIS — F419 Anxiety disorder, unspecified: Secondary | ICD-10-CM | POA: Diagnosis not present

## 2022-11-27 DIAGNOSIS — E782 Mixed hyperlipidemia: Secondary | ICD-10-CM | POA: Diagnosis not present

## 2022-11-27 DIAGNOSIS — E119 Type 2 diabetes mellitus without complications: Secondary | ICD-10-CM | POA: Diagnosis not present

## 2022-11-27 DIAGNOSIS — Z5112 Encounter for antineoplastic immunotherapy: Secondary | ICD-10-CM | POA: Diagnosis not present

## 2022-11-27 DIAGNOSIS — Z95828 Presence of other vascular implants and grafts: Secondary | ICD-10-CM

## 2022-11-27 DIAGNOSIS — K219 Gastro-esophageal reflux disease without esophagitis: Secondary | ICD-10-CM | POA: Diagnosis not present

## 2022-11-27 LAB — CMP (CANCER CENTER ONLY)
ALT: 12 U/L (ref 0–44)
AST: 22 U/L (ref 15–41)
Albumin: 3.5 g/dL (ref 3.5–5.0)
Alkaline Phosphatase: 66 U/L (ref 38–126)
Anion gap: 3 — ABNORMAL LOW (ref 5–15)
BUN: 10 mg/dL (ref 8–23)
CO2: 30 mmol/L (ref 22–32)
Calcium: 9 mg/dL (ref 8.9–10.3)
Chloride: 107 mmol/L (ref 98–111)
Creatinine: 0.74 mg/dL (ref 0.44–1.00)
GFR, Estimated: >60 mL/min (ref >60)
Glucose, Bld: 114 mg/dL — ABNORMAL HIGH (ref 70–99)
Potassium: 3.9 mmol/L (ref 3.5–5.1)
Sodium: 140 mmol/L (ref 135–145)
Total Bilirubin: 0.2 mg/dL — ABNORMAL LOW (ref 0.3–1.2)
Total Protein: 6.2 g/dL — ABNORMAL LOW (ref 6.5–8.1)

## 2022-11-27 LAB — CBC WITH DIFFERENTIAL (CANCER CENTER ONLY)
Abs Immature Granulocytes: 0.01 10*3/uL (ref 0.00–0.07)
Basophils Absolute: 0 10*3/uL (ref 0.0–0.1)
Basophils Relative: 1 %
Eosinophils Absolute: 0 10*3/uL (ref 0.0–0.5)
Eosinophils Relative: 1 %
HCT: 34.7 % — ABNORMAL LOW (ref 36.0–46.0)
Hemoglobin: 10.9 g/dL — ABNORMAL LOW (ref 12.0–15.0)
Immature Granulocytes: 0 %
Lymphocytes Relative: 31 %
Lymphs Abs: 1.3 10*3/uL (ref 0.7–4.0)
MCH: 28.7 pg (ref 26.0–34.0)
MCHC: 31.4 g/dL (ref 30.0–36.0)
MCV: 91.3 fL (ref 80.0–100.0)
Monocytes Absolute: 0.5 10*3/uL (ref 0.1–1.0)
Monocytes Relative: 12 %
Neutro Abs: 2.3 10*3/uL (ref 1.7–7.7)
Neutrophils Relative %: 55 %
Platelet Count: 268 10*3/uL (ref 150–400)
RBC: 3.8 MIL/uL — ABNORMAL LOW (ref 3.87–5.11)
RDW: 13 % (ref 11.5–15.5)
WBC Count: 4.1 10*3/uL (ref 4.0–10.5)
nRBC: 0 % (ref 0.0–0.2)

## 2022-11-27 MED ORDER — SODIUM CHLORIDE 0.9% FLUSH
10.0000 mL | Freq: Once | INTRAVENOUS | Status: AC
  Administered 2022-11-27: 10 mL

## 2022-11-27 MED ORDER — SODIUM CHLORIDE 0.9% FLUSH
10.0000 mL | INTRAVENOUS | Status: DC | PRN
  Administered 2022-11-27: 10 mL

## 2022-11-27 MED ORDER — SODIUM CHLORIDE 0.9 % IV SOLN
500.0000 mg/m2 | Freq: Once | INTRAVENOUS | Status: AC
  Administered 2022-11-27: 800 mg via INTRAVENOUS
  Filled 2022-11-27: qty 20

## 2022-11-27 MED ORDER — HEPARIN SOD (PORK) LOCK FLUSH 100 UNIT/ML IV SOLN
500.0000 [IU] | Freq: Once | INTRAVENOUS | Status: AC | PRN
  Administered 2022-11-27: 500 [IU]

## 2022-11-27 MED ORDER — SODIUM CHLORIDE 0.9 % IV SOLN
200.0000 mg | Freq: Once | INTRAVENOUS | Status: AC
  Administered 2022-11-27: 200 mg via INTRAVENOUS
  Filled 2022-11-27: qty 200

## 2022-11-27 MED ORDER — PROCHLORPERAZINE MALEATE 10 MG PO TABS
10.0000 mg | ORAL_TABLET | Freq: Once | ORAL | Status: AC
  Administered 2022-11-27: 10 mg via ORAL
  Filled 2022-11-27: qty 1

## 2022-11-27 MED ORDER — SODIUM CHLORIDE 0.9 % IV SOLN: Freq: Once | INTRAVENOUS | Status: AC

## 2022-11-27 NOTE — Progress Notes (Signed)
[facility] [phone]   Fax:([phone]  OFFICE PROGRESS NOTE  Pincus Sanes, MD [address] [place] [address]  DIAGNOSIS: Stage IV non-small cell lung cancer (T3, N0, M1C) adenocarcinoma.  The patient presented with a and solitary brain metastasis.  The patient was diagnosed in February 2022.  Biomarker Findings Microsatellite status - MS-Stable Tumor Mutational Burden - 8 Muts/Mb Genomic Findings For a complete list of the genes assayed, please refer to the Appendix. KRAS G12V KEAP1 S224F TP53 P151T 7 Disease relevant genes with no reportable alterations: ALK, BRAF, EGFR, ERBB2, MET, RET, ROS1  PDL1 Expression: 50%    PRIOR THERAPY: 1) SRS to the metastatic brain lesion on 08/23/2020 under the care of Dr. [name] and craniotomy under the care of Dr. Maurice Small scheduled for 08/24/2020. 2) S/p robotic assisted right upper lobectomy with en bloc wedge resection of the right middle lobe and lymph node dissection under the care of Dr. Dorris Fetch on September 24, 2020. 3) status post SRS to a new subcentimeter brain lesions under the care of Dr. Mitzi Hansen. 4) SRS to brain metastasis.   CURRENT THERAPY: Systemic chemotherapy with carboplatin for AUC of 5, Alimta 500 Mg/M2 and Keytruda 200 mg IV every 3 weeks.  First dose Nov 07, 2020.  Status post 34 cycles.  Starting from cycle #5 the patient will be on maintenance treatment with Alimta and Keytruda every 3 weeks.  INTERVAL HISTORY: Hannah [name] 72 y.o. female returns to the clinic today for follow-up visit accompanied by family member.  The patient is feeling fine today with no concerning complaints.  She denied having any current chest pain, shortness of breath, cough or hemoptysis.  She has no nausea, vomiting, diarrhea or constipation.  She has no headache or visual changes.  She denied having any recent weight loss or night sweats.  She has been tolerating her maintenance treatment with  Alimta and Keytruda fairly well.  She is here for evaluation before starting cycle #35 of her treatment.  MEDICAL HISTORY: Past Medical History:  Diagnosis Date   Allergy    Anemia    Anxiety    Arthritis    Carpal tunnel syndrome    Constipation    senna C stool softeners help    Depression    Diverticulosis    Dyslipidemia    External hemorrhoids    GERD (gastroesophageal reflux disease)    Heart murmur    mild-moderate AR   Hiatal hernia    Hyperlipidemia    on meds    Hypertension    Internal hemorrhoids    lung ca with brain mets 07/2020   Osteoarthritis    Pre-diabetes    PVD (peripheral vascular disease) (HCC)    moderate carotid disease   RBBB    Smoker    Vocal cord polyps     ALLERGIES:  is allergic to amlodipine, chantix [varenicline tartrate], clarithromycin, lisinopril, simvastatin, wellbutrin [bupropion hcl], lipitor [atorvastatin], and sertraline.  MEDICATIONS:  Current Outpatient Medications  Medication Sig Dispense Refill   acetaminophen (TYLENOL) 325 MG tablet Take 325 mg by mouth every 6 (six) hours as needed for moderate pain.     ALPRAZolam (XANAX XR) 0.5 MG 24 hr tablet Take 1 tablet (0.5 mg total) by mouth in the morning. 30 tablet 5   ALPRAZolam (XANAX) 0.5 MG tablet Take 1 tablet (0.5 mg total) by mouth 3 (three) times daily as needed for anxiety. 90 tablet 0  aspirin 81 MG tablet Take 1 tablet (81 mg total) by mouth daily. Restart on 08/31/20 30 tablet    bisacodyl (DULCOLAX) 5 MG EC tablet Take 5 mg by mouth daily as needed for moderate constipation.     carboxymethylcellulose (REFRESH PLUS) 0.5 % SOLN Place 1-2 drops into both eyes 3 (three) times daily as needed (dry eyes).     Cholecalciferol (D3 PO) Take 1 capsule by mouth daily.     citalopram (CELEXA) 10 MG tablet Take 1 tablet (10 mg total) by mouth daily. 90 tablet 0   fluticasone (FLONASE) 50 MCG/ACT nasal spray USE 2 SPRAYS IN EACH NOSTRIL EVERY DAY 48 g 3   folic acid (FOLVITE) 1 MG  tablet Take 1 tablet by mouth once daily 30 tablet 0   ketoconazole (NIZORAL) 2 % cream APPLY 1 APPLICATION TOPICALLY DAILY 30 g 0   Lacosamide 100 MG TABS Take 1 tablet (100 mg total) by mouth 2 (two) times daily. 60 tablet 3   levETIRAcetam (KEPPRA) 100 MG/ML solution TAKE 15 ML BY MOUTH  TWICE DAILY 473 mL 0   lidocaine-prilocaine (EMLA) cream Apply 1 Application topically as needed. 30 g 2   loratadine (CLARITIN) 10 MG tablet Take 10 mg by mouth daily as needed for allergies.     magnesium hydroxide (MILK OF MAGNESIA) 800 MG/5ML suspension Take 30 mLs by mouth daily as needed for constipation.     Menthol, Topical Analgesic, (BIOFREEZE EX) Apply 1 application topically as needed (pain).     mirtazapine (REMERON) 30 MG tablet Take 30 mg by mouth at bedtime.     olopatadine (PATANOL) 0.1 % ophthalmic solution Place 1 drop into both eyes 2 (two) times daily as needed for allergies.     omeprazole (PRILOSEC) 40 MG capsule Take 1 capsule (40 mg total) by mouth daily. 90 capsule 1   polyethylene glycol (MIRALAX / GLYCOLAX) 17 g packet Take 17 g by mouth 2 (two) times daily. 72 each 0   pravastatin (PRAVACHOL) 40 MG tablet TAKE 1 TABLET EVERY EVENING 90 tablet 3   Probiotic Product (PROBIOTIC DAILY PO) Take 1 capsule by mouth daily.     prochlorperazine (COMPAZINE) 10 MG tablet TAKE 1 TABLET BY MOUTH EVERY 6 HOURS AS NEEDED FOR NAUSEA OR VOMITING 30 tablet 0   simethicone (MYLICON) 80 MG chewable tablet Chew 1 tablet (80 mg total) by mouth 4 (four) times daily as needed for flatulence (Bloating). 30 tablet 0   sulfamethoxazole-trimethoprim (BACTRIM DS) 800-160 MG tablet Take 1 tablet by mouth 2 (two) times daily. 6 tablet 0   No current facility-administered medications for this visit.    SURGICAL HISTORY:  Past Surgical History:  Procedure Laterality Date   ABDOMINAL HYSTERECTOMY  1994   APPLICATION OF CRANIAL NAVIGATION N/A 08/24/2020   Procedure: APPLICATION OF CRANIAL NAVIGATION;  Surgeon:  Jadene Pierini, MD;  Location: [facility] OR;  Service: Neurosurgery;  Laterality: N/A;   COLONOSCOPY     COLONOSCOPY W/ POLYPECTOMY  2009   CRANIOTOMY Left 08/24/2020   Procedure: Left Craniotomy for Tumor Resection with Brainlab;  Surgeon: Jadene Pierini, MD;  Location[facility] OR;  Service: Neurosurgery;  Laterality: Left;   HEMORRHOID SURGERY     INTERCOSTAL NERVE BLOCK Right 09/24/2020   Procedure: INTERCOSTAL NERVE BLOCK;  Surgeon: Loreli Slot, MD;  Location[facility] OR;  Service: Thoracic;  Laterality: Right;   IR IMAGING GUIDED PORT INSERTION  05/15/2022   JOINT REPLACEMENT     LYMPH NODE DISSECTION  Right 09/24/2020   Procedure: LYMPH NODE DISSECTION;  Surgeon: Loreli Slot, MD;  Location: [facility] OR;  Service: Thoracic;  Laterality: Right;   POLYPECTOMY     right total knee arthroplasty     Dr. Sherle Poe 06-04-18   SPINE SURGERY  08/16/2009   TOTAL KNEE ARTHROPLASTY Left 02/13/2014   Procedure: TOTAL KNEE ARTHROPLASTY;  Surgeon: Harvie Junior, MD;  Location: [facility] OR;  Service: Orthopedics;  Laterality: Left;   TOTAL KNEE ARTHROPLASTY Right 06/04/2018   Procedure: RIGHT TOTAL KNEE ARTHROPLASTY;  Surgeon: Jodi Geralds, MD;  Location: [facility] ORS;  Service: Orthopedics;  Laterality: Right;  Adductor Block   TUBAL LIGATION      REVIEW OF SYSTEMS:  A comprehensive review of systems was negative.   PHYSICAL EXAMINATION: General appearance: alert, cooperative, and no distress Head: Normocephalic, without obvious abnormality, atraumatic Neck: no adenopathy, no JVD, supple, symmetrical, trachea midline, and thyroid not enlarged, symmetric, no tenderness/mass/nodules Lymph nodes: Cervical, supraclavicular, and axillary nodes normal. Resp: clear to auscultation bilaterally Back: symmetric, no curvature. ROM normal. No CVA tenderness. Cardio: regular rate and rhythm, S1, S2 normal, no murmur, click, rub or gallop GI: soft, non-tender; bowel sounds normal; no masses,  no organomegaly Extremities:  extremities normal, atraumatic, no cyanosis or edema  ECOG PERFORMANCE STATUS: 1 - Symptomatic but completely ambulatory  Blood pressure 136/81, pulse 68, temperature 99.1 F (37.3 C), temperature source Oral, resp. rate 17, height 5' 1.52" (1.563 m), weight 131 lb 8 oz (59.6 kg), SpO2 99 %.  LABORATORY DATA: Lab Results  Component Value Date   WBC 4.1 11/27/2022   HGB 10.9 (L) 11/27/2022   HCT 34.7 (L) 11/27/2022   MCV 91.3 11/27/2022   PLT 268 11/27/2022      Chemistry      Component Value Date/Time   NA 139 11/06/2022 1105   K 4.0 11/06/2022 1105   CL 103 11/06/2022 1105   CO2 29 11/06/2022 1105   BUN 15 11/06/2022 1105   CREATININE 0.78 11/06/2022 1105   CREATININE 0.79 03/14/2020 1016      Component Value Date/Time   CALCIUM 9.2 11/06/2022 1105   ALKPHOS 63 11/06/2022 1105   AST 21 11/06/2022 1105   ALT 14 11/06/2022 1105   BILITOT 0.3 11/06/2022 1105       RADIOGRAPHIC STUDIES: MR BRAIN W WO CONTRAST  Result Date: 11/21/2022 CLINICAL DATA:  72 year old female with metastatic lung cancer. Left frontal metastasis SRS and resection in 2022, two additional lesions treated with SRS that year. LITT therapy for progression/recurrence at the left parietal/occipital treatment area in April 2023. And subsequent post treatment SRS to that area in May 2023. Recurrent enhancement of a 3 mm previously treated right temporal lesion in October 2023. Subsequent encounter. EXAM: MRI HEAD WITHOUT AND WITH CONTRAST TECHNIQUE: Multiplanar, multiecho pulse sequences of the brain and surrounding structures were obtained without and with intravenous contrast. CONTRAST:  6 mL Vueway COMPARISON:  07/25/2022, 04/25/2022 and earlier. FINDINGS: Brain: Enhancement along the posterior right temporal lobe appears mildly progressed since 04/25/2022 on series 13, image 88. The enhancement here appears larger on both black blood and MPRAGE postcontrast images. Likewise, a small area/subtle associated  T2/FLAIR hyperintensity is mild but increased on series 10, image 34. There is no regional mass effect. Contralateral triangular area of enhancement in the left occipital lobe with overlying skull defect remains stable since 04/25/2022, up to about 18 mm long axis. Regional T2/FLAIR hyperintensity remains stable, no regional mass effect. Stable hemosiderin. Left  superior frontal gyrus patchy and stellate enhancement remains stable with overlying craniotomy. Regional T2 and FLAIR hyperintensity is stable with no regional mass effect. Stable hemosiderin. No new enhancing lesion.  No suspicious dural thickening. No superimposed restricted diffusion to suggest acute infarction. No midline shift, acute intracranial hemorrhage. Cervicomedullary junction and pituitary are within normal limits. Stable gray and white matter signal elsewhere. Stable underlying cerebral volume. Vascular: Major intracranial vascular flow voids are stable. Skull and upper cervical spine: Postoperative skull defects are stable. Background bone marrow signal within normal limits. Negative for age visible cervical spine. Visible spinal cord appears negative. Sinuses/Orbits: And negative, mild paranasal sinus mucosal thickening. Stable Other: Stable and negative visible internal auditory structures. Stable mild right mastoid effusion. Stable visible scalp and face. IMPRESSION: 1. The previously treated posterior right temporal cortical lesion shows mild but increasing enhancement and FLAIR hyperintensity. No associated mass effect. Attention directed on follow-up. 2. Continued stable treated left frontal and left parietal/occipital lesions. 3. No new metastatic disease, new intracranial abnormality. Electronically Signed   By: Odessa Fleming M.D.   On: 11/21/2022 09:35     ASSESSMENT AND PLAN: This is a very pleasant 72 years old African-American female diagnosed with stage IV (T3, N0, M1b) non-small cell lung cancer, adenocarcinoma presented with right  upper lobe lung mass with solitary brain metastasis diagnosed in February 2022 status post SRS to the brain lesion followed by craniotomy and resection. The patient also has a solitary lung mass. S/p robotic assisted right upper lobectomy with en bloc wedge resection of the right middle lobe and lymph node dissection under the care of Dr. Dorris Fetch on September 24, 2020. She also underwent SRS treatment to a new subcentimeter brain lesions under the care of Dr. Mitzi Hansen She is currently undergoing systemic chemotherapy with carboplatin for AUC of 5, Alimta 500 Mg/M2 and Keytruda 200 Mg IV every 3 weeks status post 34 cycles.  Starting from cycle #5 the patient will be on maintenance treatment with Alimta and Keytruda every 3 weeks.   The patient continues to tolerate this treatment well with no concerning adverse effects. I recommended for her to proceed with cycle #35 today as planned. I will arrange for her to have repeat CT scan of the chest, abdomen and pelvis before the next cycle of her treatment. If the scan showed no concerning findings for disease progression, I may discontinue the treatment with immunotherapy and she will remain on maintenance Alimta every 3 weeks. For the brain metastasis, she is currently followed by radiation oncology and Dr. Barbaraann Cao. The patient was advised to call immediately if she has any other concerning symptoms in the interval. The patient voices understanding of current disease status and treatment options and is in agreement with the current care plan.  All questions were answered. The patient knows to call the clinic with any problems, questions or concerns. We can certainly see the patient much sooner if necessary.  Disclaimer: This note was dictated with voice recognition software. Similar sounding words can inadvertently be transcribed and may not be corrected upon review.

## 2022-11-27 NOTE — Patient Instructions (Signed)
[facility]  Discharge Instructions: Thank you for choosing [facility] to provide your oncology and hematology care.   If you have a lab appointment with the Cancer Center, please go directly to the Cancer Center and check in at the registration area.   Wear comfortable clothing and clothing appropriate for easy access to any Portacath or PICC line.   We strive to give you quality time with your provider. You may need to reschedule your appointment if you arrive late (15 or more minutes).  Arriving late affects you and other patients whose appointments are after yours.  Also, if you miss three or more appointments without notifying the office, you may be dismissed from the clinic at the provider's discretion.      For prescription refill requests, have your pharmacy contact our office and allow 72 hours for refills to be completed.    Today you received the following chemotherapy and/or immunotherapy agents keytruda , alimta      To help prevent nausea and vomiting after your treatment, we encourage you to take your nausea medication as directed.  BELOW ARE SYMPTOMS THAT SHOULD BE REPORTED IMMEDIATELY: *FEVER GREATER THAN 100.4 F (38 C) OR HIGHER *CHILLS OR SWEATING *NAUSEA AND VOMITING THAT IS NOT CONTROLLED WITH YOUR NAUSEA MEDICATION *UNUSUAL SHORTNESS OF BREATH *UNUSUAL BRUISING OR BLEEDING *URINARY PROBLEMS (pain or burning when urinating, or frequent urination) *BOWEL PROBLEMS (unusual diarrhea, constipation, pain near the anus) TENDERNESS IN MOUTH AND THROAT WITH OR WITHOUT PRESENCE OF ULCERS (sore throat, sores in mouth, or a toothache) UNUSUAL RASH, SWELLING OR PAIN  UNUSUAL VAGINAL DISCHARGE OR ITCHING   Items with * indicate a potential emergency and should be followed up as soon as possible or go to the Emergency Department if any problems should occur.  Please show the CHEMOTHERAPY ALERT CARD or IMMUNOTHERAPY ALERT CARD  at check-in to the Emergency Department and triage nurse.  Should you have questions after your visit or need to cancel or reschedule your appointment, please contact [facility]  Dept: [phone]  and follow the prompts.  Office hours are 8:00 a.m. to 4:30 p.m. Monday - Friday. Please note that voicemails left after 4:00 p.m. may not be returned until the following business day.  We are closed weekends and major holidays. You have access to a nurse at all times for urgent questions. Please call the main number to the clinic Dept: [phone] and follow the prompts.   For any non-urgent questions, you may also contact your provider using MyChart. We now offer e-Visits for anyone 18 and older to request care online for non-urgent symptoms. For details visit [email or web address].   Also download the MyChart app! Go to the app store, search "MyChart", open the app, select [facility], and log in with your MyChart username and password.   

## 2022-11-28 ENCOUNTER — Telehealth: Payer: Self-pay | Admitting: Internal Medicine

## 2022-11-28 NOTE — Telephone Encounter (Signed)
Called patient regarding upcoming appointments, patient is notified. 

## 2022-12-01 ENCOUNTER — Inpatient Hospital Stay: Payer: Medicare HMO

## 2022-12-11 ENCOUNTER — Other Ambulatory Visit: Payer: Self-pay | Admitting: Internal Medicine

## 2022-12-11 DIAGNOSIS — C7931 Secondary malignant neoplasm of brain: Secondary | ICD-10-CM

## 2022-12-16 ENCOUNTER — Ambulatory Visit (HOSPITAL_COMMUNITY)
Admission: RE | Admit: 2022-12-16 | Discharge: 2022-12-16 | Disposition: A | Discharge status: Home / Self Care | Payer: Medicare HMO | Source: Ambulatory Visit | Attending: Internal Medicine | Admitting: Internal Medicine

## 2022-12-16 DIAGNOSIS — C3491 Malignant neoplasm of unspecified part of right bronchus or lung: Secondary | ICD-10-CM | POA: Diagnosis not present

## 2022-12-16 DIAGNOSIS — C3490 Malignant neoplasm of unspecified part of unspecified bronchus or lung: Secondary | ICD-10-CM | POA: Insufficient documentation

## 2022-12-16 DIAGNOSIS — C7931 Secondary malignant neoplasm of brain: Secondary | ICD-10-CM | POA: Diagnosis not present

## 2022-12-16 MED ORDER — IOHEXOL 300 MG/ML  SOLN
100.0000 mL | Freq: Once | INTRAMUSCULAR | Status: AC | PRN
  Administered 2022-12-16: 100 mL via INTRAVENOUS

## 2022-12-18 ENCOUNTER — Other Ambulatory Visit: Payer: Self-pay | Admitting: Internal Medicine

## 2022-12-18 ENCOUNTER — Inpatient Hospital Stay: Payer: Medicare HMO | Attending: Internal Medicine | Admitting: Internal Medicine

## 2022-12-18 ENCOUNTER — Other Ambulatory Visit: Payer: Self-pay

## 2022-12-18 VITALS — BP 115/79 | HR 80 | Temp 97.6°F | Ht 61.0 in | Wt 129.3 lb

## 2022-12-18 DIAGNOSIS — I739 Peripheral vascular disease, unspecified: Secondary | ICD-10-CM | POA: Diagnosis not present

## 2022-12-18 DIAGNOSIS — K8020 Calculus of gallbladder without cholecystitis without obstruction: Secondary | ICD-10-CM | POA: Diagnosis not present

## 2022-12-18 DIAGNOSIS — M1990 Unspecified osteoarthritis, unspecified site: Secondary | ICD-10-CM | POA: Insufficient documentation

## 2022-12-18 DIAGNOSIS — Z87891 Personal history of nicotine dependence: Secondary | ICD-10-CM | POA: Insufficient documentation

## 2022-12-18 DIAGNOSIS — Z79899 Other long term (current) drug therapy: Secondary | ICD-10-CM | POA: Diagnosis not present

## 2022-12-18 DIAGNOSIS — Z792 Long term (current) use of antibiotics: Secondary | ICD-10-CM | POA: Diagnosis not present

## 2022-12-18 DIAGNOSIS — Z90710 Acquired absence of both cervix and uterus: Secondary | ICD-10-CM | POA: Diagnosis not present

## 2022-12-18 DIAGNOSIS — C3490 Malignant neoplasm of unspecified part of unspecified bronchus or lung: Secondary | ICD-10-CM | POA: Diagnosis not present

## 2022-12-18 DIAGNOSIS — F32A Depression, unspecified: Secondary | ICD-10-CM | POA: Diagnosis not present

## 2022-12-18 DIAGNOSIS — C7931 Secondary malignant neoplasm of brain: Secondary | ICD-10-CM | POA: Insufficient documentation

## 2022-12-18 DIAGNOSIS — K5730 Diverticulosis of large intestine without perforation or abscess without bleeding: Secondary | ICD-10-CM | POA: Insufficient documentation

## 2022-12-18 DIAGNOSIS — E785 Hyperlipidemia, unspecified: Secondary | ICD-10-CM | POA: Insufficient documentation

## 2022-12-18 DIAGNOSIS — Z888 Allergy status to other drugs, medicaments and biological substances status: Secondary | ICD-10-CM | POA: Diagnosis not present

## 2022-12-18 DIAGNOSIS — D496 Neoplasm of unspecified behavior of brain: Secondary | ICD-10-CM

## 2022-12-18 DIAGNOSIS — Z8719 Personal history of other diseases of the digestive system: Secondary | ICD-10-CM | POA: Insufficient documentation

## 2022-12-18 DIAGNOSIS — K219 Gastro-esophageal reflux disease without esophagitis: Secondary | ICD-10-CM | POA: Insufficient documentation

## 2022-12-18 DIAGNOSIS — F419 Anxiety disorder, unspecified: Secondary | ICD-10-CM | POA: Diagnosis not present

## 2022-12-18 DIAGNOSIS — I700 Atherosclerosis of aorta: Secondary | ICD-10-CM | POA: Diagnosis not present

## 2022-12-18 DIAGNOSIS — C3411 Malignant neoplasm of upper lobe, right bronchus or lung: Secondary | ICD-10-CM | POA: Insufficient documentation

## 2022-12-18 NOTE — Progress Notes (Signed)
[facility] [phone]   [phone]  OFFICE PROGRESS NOTE  Pincus Sanes, MD [address] [place] [address]  DIAGNOSIS: Stage IV non-small cell lung cancer (T3, N0, M1C) adenocarcinoma.  The patient presented with a and solitary brain metastasis.  The patient was diagnosed in February 2022.  Biomarker Findings Microsatellite status - MS-Stable Tumor Mutational Burden - 8 Muts/Mb Genomic Findings For a complete list of the genes assayed, please refer to the Appendix. KRAS G12V KEAP1 S224F TP53 P151T 7 Disease relevant genes with no reportable alterations: ALK, BRAF, EGFR, ERBB2, MET, RET, ROS1  PDL1 Expression: 50%    PRIOR THERAPY: 1) SRS to the metastatic brain lesion on 08/23/2020 under the care of Dr. [name] and craniotomy under the care of Dr. Maurice Small scheduled for 08/24/2020. 2) S/p robotic assisted right upper lobectomy with en bloc wedge resection of the right middle lobe and lymph node dissection under the care of Dr. Dorris Fetch on September 24, 2020. 3) status post SRS to a new subcentimeter brain lesions under the care of Dr. Mitzi Hansen. 4) SRS to brain metastasis. 5) Systemic chemotherapy with carboplatin for AUC of 5, Alimta 500 Mg/M2 and Keytruda 200 mg IV every 3 weeks.  First dose Nov 07, 2020.  Status post 35 cycles.  Starting from cycle #5 the patient will be on maintenance treatment with Alimta and Keytruda every 3 weeks.   CURRENT THERAPY: Observation.  INTERVAL HISTORY: Hannah [name] 72 y.o. female returns to the clinic today for follow-up visit accompanied by her daughter.  The patient is feeling fine today with no concerning complaints.  She denied having any current chest pain, shortness of breath, cough or hemoptysis.  She has no nausea, vomiting, diarrhea or constipation.  She has no headache or visual changes.  She has no recent weight loss or night sweats.  She continues to have mild fatigue.  She completed 2  years of treatment with immunotherapy.  She had repeat CT scan of the chest, abdomen and pelvis performed recently and she is here for evaluation and discussion of her scan results.  MEDICAL HISTORY: Past Medical History:  Diagnosis Date   Allergy    Anemia    Anxiety    Arthritis    Carpal tunnel syndrome    Constipation    senna C stool softeners help    Depression    Diverticulosis    Dyslipidemia    External hemorrhoids    GERD (gastroesophageal reflux disease)    Heart murmur    mild-moderate AR   Hiatal hernia    Hyperlipidemia    on meds    Hypertension    Internal hemorrhoids    lung ca with brain mets 07/2020   Osteoarthritis    Pre-diabetes    PVD (peripheral vascular disease) (HCC)    moderate carotid disease   RBBB    Smoker    Vocal cord polyps     ALLERGIES:  is allergic to amlodipine, chantix [varenicline tartrate], clarithromycin, lisinopril, simvastatin, wellbutrin [bupropion hcl], lipitor [atorvastatin], and sertraline.  MEDICATIONS:  Current Outpatient Medications  Medication Sig Dispense Refill   acetaminophen (TYLENOL) 325 MG tablet Take 325 mg by mouth every 6 (six) hours as needed for moderate pain.     ALPRAZolam (XANAX XR) 0.5 MG 24 hr tablet Take 1 tablet (0.5 mg total) by mouth in the morning. 30 tablet 5   ALPRAZolam (XANAX) 0.5 MG tablet Take 1 tablet (0.5  mg total) by mouth 3 (three) times daily as needed for anxiety. 90 tablet 0   aspirin 81 MG tablet Take 1 tablet (81 mg total) by mouth daily. Restart on 08/31/20 30 tablet    bisacodyl (DULCOLAX) 5 MG EC tablet Take 5 mg by mouth daily as needed for moderate constipation.     carboxymethylcellulose (REFRESH PLUS) 0.5 % SOLN Place 1-2 drops into both eyes 3 (three) times daily as needed (dry eyes).     Cholecalciferol (D3 PO) Take 1 capsule by mouth daily.     citalopram (CELEXA) 10 MG tablet Take 1 tablet (10 mg total) by mouth daily. 90 tablet 0   fluticasone (FLONASE) 50 MCG/ACT nasal  spray USE 2 SPRAYS IN EACH NOSTRIL EVERY DAY 48 g 3   folic acid (FOLVITE) 1 MG tablet Take 1 tablet by mouth once daily 30 tablet 0   ketoconazole (NIZORAL) 2 % cream APPLY 1 APPLICATION TOPICALLY DAILY 30 g 0   Lacosamide 100 MG TABS Take 1 tablet (100 mg total) by mouth 2 (two) times daily. 60 tablet 3   levETIRAcetam (KEPPRA) 100 MG/ML solution TAKE 15 ML BY MOUTH  TWICE DAILY 473 mL 0   lidocaine-prilocaine (EMLA) cream Apply 1 Application topically as needed. 30 g 2   loratadine (CLARITIN) 10 MG tablet Take 10 mg by mouth daily as needed for allergies.     magnesium hydroxide (MILK OF MAGNESIA) 800 MG/5ML suspension Take 30 mLs by mouth daily as needed for constipation.     Menthol, Topical Analgesic, (BIOFREEZE EX) Apply 1 application topically as needed (pain).     mirtazapine (REMERON) 30 MG tablet Take 30 mg by mouth at bedtime.     olopatadine (PATANOL) 0.1 % ophthalmic solution Place 1 drop into both eyes 2 (two) times daily as needed for allergies.     omeprazole (PRILOSEC) 40 MG capsule Take 1 capsule (40 mg total) by mouth daily. 90 capsule 1   polyethylene glycol (MIRALAX / GLYCOLAX) 17 g packet Take 17 g by mouth 2 (two) times daily. 72 each 0   pravastatin (PRAVACHOL) 40 MG tablet TAKE 1 TABLET EVERY EVENING 90 tablet 3   Probiotic Product (PROBIOTIC DAILY PO) Take 1 capsule by mouth daily.     prochlorperazine (COMPAZINE) 10 MG tablet TAKE 1 TABLET BY MOUTH EVERY 6 HOURS AS NEEDED FOR NAUSEA OR VOMITING 30 tablet 0   simethicone (MYLICON) 80 MG chewable tablet Chew 1 tablet (80 mg total) by mouth 4 (four) times daily as needed for flatulence (Bloating). 30 tablet 0   sulfamethoxazole-trimethoprim (BACTRIM DS) 800-160 MG tablet Take 1 tablet by mouth 2 (two) times daily. 6 tablet 0   No current facility-administered medications for this visit.    SURGICAL HISTORY:  Past Surgical History:  Procedure Laterality Date   ABDOMINAL HYSTERECTOMY  1994   APPLICATION OF CRANIAL  NAVIGATION N/A 08/24/2020   Procedure: APPLICATION OF CRANIAL NAVIGATION;  Surgeon: Jadene Pierini, MD;  Location: [facility] OR;  Service: Neurosurgery;  Laterality: N/A;   COLONOSCOPY     COLONOSCOPY W/ POLYPECTOMY  2009   CRANIOTOMY Left 08/24/2020   Procedure: Left Craniotomy for Tumor Resection with Brainlab;  Surgeon: Jadene Pierini, MD;  Location: [facility] OR;  Service: Neurosurgery;  Laterality: Left;   HEMORRHOID SURGERY     INTERCOSTAL NERVE BLOCK Right 09/24/2020   Procedure: INTERCOSTAL NERVE BLOCK;  Surgeon: Loreli Slot, MD;  Location: [facility] OR;  Service: Thoracic;  Laterality: Right;   IR  IMAGING GUIDED PORT INSERTION  05/15/2022   JOINT REPLACEMENT     LYMPH NODE DISSECTION Right 09/24/2020   Procedure: LYMPH NODE DISSECTION;  Surgeon: Loreli Slot, MD;  Location: [facility] OR;  Service: Thoracic;  Laterality: Right;   POLYPECTOMY     right total knee arthroplasty     Dr. Sherle Poe 06-04-18   SPINE SURGERY  08/16/2009   TOTAL KNEE ARTHROPLASTY Left 02/13/2014   Procedure: TOTAL KNEE ARTHROPLASTY;  Surgeon: Harvie Junior, MD;  Location: [facility] OR;  Service: Orthopedics;  Laterality: Left;   TOTAL KNEE ARTHROPLASTY Right 06/04/2018   Procedure: RIGHT TOTAL KNEE ARTHROPLASTY;  Surgeon: Jodi Geralds, MD;  Location: [facility] ORS;  Service: Orthopedics;  Laterality: Right;  Adductor Block   TUBAL LIGATION      REVIEW OF SYSTEMS:  Constitutional: positive for fatigue Eyes: negative Ears, nose, mouth, throat, and face: negative Respiratory: negative Cardiovascular: negative Gastrointestinal: negative Genitourinary:negative Integument/breast: negative Hematologic/lymphatic: negative Musculoskeletal:negative Neurological: negative Behavioral/Psych: negative Endocrine: negative Allergic/Immunologic: negative   PHYSICAL EXAMINATION: General appearance: alert, cooperative, fatigued, and no distress Head: Normocephalic, without obvious abnormality, atraumatic Neck: no adenopathy, no JVD,  supple, symmetrical, trachea midline, and thyroid not enlarged, symmetric, no tenderness/mass/nodules Lymph nodes: Cervical, supraclavicular, and axillary nodes normal. Resp: clear to auscultation bilaterally Back: symmetric, no curvature. ROM normal. No CVA tenderness. Cardio: regular rate and rhythm, S1, S2 normal, no murmur, click, rub or gallop GI: soft, non-tender; bowel sounds normal; no masses,  no organomegaly Extremities: extremities normal, atraumatic, no cyanosis or edema Neurologic: Alert and oriented X 3, normal strength and tone. Normal symmetric reflexes. Normal coordination and gait  ECOG PERFORMANCE STATUS: 1 - Symptomatic but completely ambulatory  Blood pressure 136/81, pulse 68, temperature 99.1 F (37.3 C), temperature source Oral, resp. rate 17, height 5' 1.52" (1.563 m), weight 131 lb 8 oz (59.6 kg), SpO2 99 %.  LABORATORY DATA: Lab Results  Component Value Date   WBC 4.1 11/27/2022   HGB 10.9 (L) 11/27/2022   HCT 34.7 (L) 11/27/2022   MCV 91.3 11/27/2022   PLT 268 11/27/2022      Chemistry      Component Value Date/Time   NA 139 11/06/2022 1105   K 4.0 11/06/2022 1105   CL 103 11/06/2022 1105   CO2 29 11/06/2022 1105   BUN 15 11/06/2022 1105   CREATININE 0.78 11/06/2022 1105   CREATININE 0.79 03/14/2020 1016      Component Value Date/Time   CALCIUM 9.2 11/06/2022 1105   ALKPHOS 63 11/06/2022 1105   AST 21 11/06/2022 1105   ALT 14 11/06/2022 1105   BILITOT 0.3 11/06/2022 1105       RADIOGRAPHIC STUDIES: CT Chest W Contrast  Result Date: 12/18/2022 CLINICAL DATA:  Right upper lobe non-small cell lung cancer. Brain metastases. Restaging. * Tracking Code: BO * EXAM: CT CHEST, ABDOMEN, AND PELVIS WITH CONTRAST TECHNIQUE: Multidetector CT imaging of the chest, abdomen and pelvis was performed following the standard protocol during bolus administration of intravenous contrast. RADIATION DOSE REDUCTION: This exam was performed according to the  departmental dose-optimization program which includes automated exposure control, adjustment of the mA and/or kV according to patient size and/or use of iterative reconstruction technique. CONTRAST:  OMNIPAQUE IOHEXOL 300 MG/ML  SOLN COMPARISON:  10/11/2022 FINDINGS: CT CHEST FINDINGS Cardiovascular: The heart size is normal. No substantial pericardial effusion. Coronary artery calcification is evident. Mild atherosclerotic calcification is noted in the wall of the thoracic aorta. Right-sided Port-A-Cath tip is positioned in the  right atrium. Mediastinum/Nodes: No mediastinal lymphadenopathy. There is no hilar lymphadenopathy. The esophagus has normal imaging features. There is no axillary lymphadenopathy. Lungs/Pleura: Surgical changes noted anterior right lung. Similar appearance of scarring along the staple line. 3 mm left lower lobe nodule on 79/4 is unchanged. There is no new suspicious pulmonary nodule or mass. No focal airspace consolidation. No pleural effusion. Musculoskeletal: No worrisome lytic or sclerotic osseous abnormality. CT ABDOMEN PELVIS FINDINGS Hepatobiliary: No suspicious focal abnormality within the liver parenchyma. Noncalcified gallstones evident. No intrahepatic or extrahepatic biliary dilation. Pancreas: No focal mass lesion. No dilatation of the main duct. No intraparenchymal cyst. No peripancreatic edema. Spleen: No splenomegaly. No suspicious focal mass lesion. Adrenals/Urinary Tract: No adrenal nodule or mass. Nodular thickening of the left adrenal gland is stable. Kidneys unremarkable. No evidence for hydroureter. Bladder is decompressed which likely accentuates the appearance of wall thickening. Stomach/Bowel: Stomach is unremarkable. No gastric wall thickening. No evidence of outlet obstruction. Duodenum is normally positioned as is the ligament of Treitz. No small bowel wall thickening. No small bowel dilatation. The terminal ileum is normal. The appendix is normal. No gross  colonic mass. No colonic wall thickening. Diverticular changes are noted in the left colon without evidence of diverticulitis. Vascular/Lymphatic: There is moderate atherosclerotic calcification of the abdominal aorta without aneurysm. There is no gastrohepatic or hepatoduodenal ligament lymphadenopathy. No retroperitoneal or mesenteric lymphadenopathy. No pelvic sidewall lymphadenopathy. Reproductive: Hysterectomy.  There is no adnexal mass. Other: No intraperitoneal free fluid. Musculoskeletal: Pelvic floor laxity evident. No worrisome lytic or sclerotic osseous abnormality. Posterior lower lumbar fusion hardware again noted. IMPRESSION: 1. Stable exam. No new or progressive findings to suggest recurrent or metastatic disease in the chest, abdomen, or pelvis. 2. Stable 3 mm left lower lobe pulmonary nodule. Most likely benign, continued attention on follow-up suggested. 3. Stable nodular thickening of the left adrenal gland. 4. Cholelithiasis. 5. Left colonic diverticulosis without diverticulitis. 6.  Aortic Atherosclerosis (ICD10-I70.0). Electronically Signed   By: Kennith Center M.D.   On: 12/18/2022 08:13   CT Abdomen Pelvis W Contrast  Result Date: 12/18/2022 CLINICAL DATA:  Right upper lobe non-small cell lung cancer. Brain metastases. Restaging. * Tracking Code: BO * EXAM: CT CHEST, ABDOMEN, AND PELVIS WITH CONTRAST TECHNIQUE: Multidetector CT imaging of the chest, abdomen and pelvis was performed following the standard protocol during bolus administration of intravenous contrast. RADIATION DOSE REDUCTION: This exam was performed according to the departmental dose-optimization program which includes automated exposure control, adjustment of the mA and/or kV according to patient size and/or use of iterative reconstruction technique. CONTRAST:  OMNIPAQUE IOHEXOL 300 MG/ML  SOLN COMPARISON:  10/11/2022 FINDINGS: CT CHEST FINDINGS Cardiovascular: The heart size is normal. No substantial pericardial  effusion. Coronary artery calcification is evident. Mild atherosclerotic calcification is noted in the wall of the thoracic aorta. Right-sided Port-A-Cath tip is positioned in the right atrium. Mediastinum/Nodes: No mediastinal lymphadenopathy. There is no hilar lymphadenopathy. The esophagus has normal imaging features. There is no axillary lymphadenopathy. Lungs/Pleura: Surgical changes noted anterior right lung. Similar appearance of scarring along the staple line. 3 mm left lower lobe nodule on 79/4 is unchanged. There is no new suspicious pulmonary nodule or mass. No focal airspace consolidation. No pleural effusion. Musculoskeletal: No worrisome lytic or sclerotic osseous abnormality. CT ABDOMEN PELVIS FINDINGS Hepatobiliary: No suspicious focal abnormality within the liver parenchyma. Noncalcified gallstones evident. No intrahepatic or extrahepatic biliary dilation. Pancreas: No focal mass lesion. No dilatation of the main duct. No intraparenchymal cyst. No  peripancreatic edema. Spleen: No splenomegaly. No suspicious focal mass lesion. Adrenals/Urinary Tract: No adrenal nodule or mass. Nodular thickening of the left adrenal gland is stable. Kidneys unremarkable. No evidence for hydroureter. Bladder is decompressed which likely accentuates the appearance of wall thickening. Stomach/Bowel: Stomach is unremarkable. No gastric wall thickening. No evidence of outlet obstruction. Duodenum is normally positioned as is the ligament of Treitz. No small bowel wall thickening. No small bowel dilatation. The terminal ileum is normal. The appendix is normal. No gross colonic mass. No colonic wall thickening. Diverticular changes are noted in the left colon without evidence of diverticulitis. Vascular/Lymphatic: There is moderate atherosclerotic calcification of the abdominal aorta without aneurysm. There is no gastrohepatic or hepatoduodenal ligament lymphadenopathy. No retroperitoneal or mesenteric lymphadenopathy. No  pelvic sidewall lymphadenopathy. Reproductive: Hysterectomy.  There is no adnexal mass. Other: No intraperitoneal free fluid. Musculoskeletal: Pelvic floor laxity evident. No worrisome lytic or sclerotic osseous abnormality. Posterior lower lumbar fusion hardware again noted. IMPRESSION: 1. Stable exam. No new or progressive findings to suggest recurrent or metastatic disease in the chest, abdomen, or pelvis. 2. Stable 3 mm left lower lobe pulmonary nodule. Most likely benign, continued attention on follow-up suggested. 3. Stable nodular thickening of the left adrenal gland. 4. Cholelithiasis. 5. Left colonic diverticulosis without diverticulitis. 6.  Aortic Atherosclerosis (ICD10-I70.0). Electronically Signed   By: Kennith Center M.D.   On: 12/18/2022 08:13   MR BRAIN W WO CONTRAST  Result Date: 11/21/2022 CLINICAL DATA:  72 year old female with metastatic lung cancer. Left frontal metastasis SRS and resection in 2022, two additional lesions treated with SRS that year. LITT therapy for progression/recurrence at the left parietal/occipital treatment area in April 2023. And subsequent post treatment SRS to that area in May 2023. Recurrent enhancement of a 3 mm previously treated right temporal lesion in October 2023. Subsequent encounter. EXAM: MRI HEAD WITHOUT AND WITH CONTRAST TECHNIQUE: Multiplanar, multiecho pulse sequences of the brain and surrounding structures were obtained without and with intravenous contrast. CONTRAST:  6 mL Vueway COMPARISON:  07/25/2022, 04/25/2022 and earlier. FINDINGS: Brain: Enhancement along the posterior right temporal lobe appears mildly progressed since 04/25/2022 on series 13, image 88. The enhancement here appears larger on both black blood and MPRAGE postcontrast images. Likewise, a small area/subtle associated T2/FLAIR hyperintensity is mild but increased on series 10, image 34. There is no regional mass effect. Contralateral triangular area of enhancement in the left  occipital lobe with overlying skull defect remains stable since 04/25/2022, up to about 18 mm long axis. Regional T2/FLAIR hyperintensity remains stable, no regional mass effect. Stable hemosiderin. Left superior frontal gyrus patchy and stellate enhancement remains stable with overlying craniotomy. Regional T2 and FLAIR hyperintensity is stable with no regional mass effect. Stable hemosiderin. No new enhancing lesion.  No suspicious dural thickening. No superimposed restricted diffusion to suggest acute infarction. No midline shift, acute intracranial hemorrhage. Cervicomedullary junction and pituitary are within normal limits. Stable gray and white matter signal elsewhere. Stable underlying cerebral volume. Vascular: Major intracranial vascular flow voids are stable. Skull and upper cervical spine: Postoperative skull defects are stable. Background bone marrow signal within normal limits. Negative for age visible cervical spine. Visible spinal cord appears negative. Sinuses/Orbits: And negative, mild paranasal sinus mucosal thickening. Stable Other: Stable and negative visible internal auditory structures. Stable mild right mastoid effusion. Stable visible scalp and face. IMPRESSION: 1. The previously treated posterior right temporal cortical lesion shows mild but increasing enhancement and FLAIR hyperintensity. No associated mass effect. Attention directed on  follow-up. 2. Continued stable treated left frontal and left parietal/occipital lesions. 3. No new metastatic disease, new intracranial abnormality. Electronically Signed   By: Odessa Fleming M.D.   On: 11/21/2022 09:35     ASSESSMENT AND PLAN: This is a very pleasant 72 years old African-American female diagnosed with stage IV (T3, N0, M1b) non-small cell lung cancer, adenocarcinoma presented with right upper lobe lung mass with solitary brain metastasis diagnosed in February 2022 status post SRS to the brain lesion followed by craniotomy and resection. The  patient also has a solitary lung mass. S/p robotic assisted right upper lobectomy with en bloc wedge resection of the right middle lobe and lymph node dissection under the care of Dr. Dorris Fetch on September 24, 2020. She also underwent SRS treatment to a new subcentimeter brain lesions under the care of Dr. Mitzi Hansen She underwent systemic chemotherapy with carboplatin for AUC of 5, Alimta 500 Mg/M2 and Keytruda 200 Mg IV every 3 weeks status post 35 cycles.  Starting from cycle #5 the patient will be on maintenance treatment with Alimta and Keytruda every 3 weeks.   The patient tolerated her previous treatment fairly well with no concerning adverse effect except for fatigue.  She completed 2 years of treatment with Keytruda. She had repeat CT scan of the chest, abdomen and pelvis performed recently.  I personally and independently reviewed the scan and discussed the result with the patient and her daughter. Her scan showed no concerning findings for disease progression. I recommended for the patient to continue on observation from now on with close monitoring and repeat imaging studies at least every 3 months for the first year. For the brain metastasis, she will continue to follow with Dr. Tildon Husky and Dr. Barbaraann Cao. I will see her back for follow-up visit in 3 months with repeat CT scan of the chest, abdomen and pelvis for restaging of her disease. She will continue to have Port-A-Cath flush every 6 weeks. The patient was advised to call immediately if she has any other concerning symptoms in the interval. The patient voices understanding of current disease status and treatment options and is in agreement with the current care plan.  All questions were answered. The patient knows to call the clinic with any problems, questions or concerns. We can certainly see the patient much sooner if necessary. The total time spent in the appointment was 30 minutes.  Disclaimer: This note was dictated with voice  recognition software. Similar sounding words can inadvertently be transcribed and may not be corrected upon review.

## 2022-12-23 ENCOUNTER — Ambulatory Visit (INDEPENDENT_AMBULATORY_CARE_PROVIDER_SITE_OTHER): Payer: Medicare HMO | Admitting: Internal Medicine

## 2022-12-23 ENCOUNTER — Encounter: Payer: Self-pay | Admitting: Internal Medicine

## 2022-12-23 VITALS — BP 124/64 | HR 72 | Temp 98.2°F | Ht 61.0 in | Wt 128.0 lb

## 2022-12-23 DIAGNOSIS — F419 Anxiety disorder, unspecified: Secondary | ICD-10-CM

## 2022-12-23 DIAGNOSIS — F3289 Other specified depressive episodes: Secondary | ICD-10-CM | POA: Diagnosis not present

## 2022-12-23 DIAGNOSIS — E119 Type 2 diabetes mellitus without complications: Secondary | ICD-10-CM | POA: Diagnosis not present

## 2022-12-23 DIAGNOSIS — N3001 Acute cystitis with hematuria: Secondary | ICD-10-CM | POA: Diagnosis not present

## 2022-12-23 DIAGNOSIS — J019 Acute sinusitis, unspecified: Secondary | ICD-10-CM | POA: Diagnosis not present

## 2022-12-23 DIAGNOSIS — R300 Dysuria: Secondary | ICD-10-CM | POA: Diagnosis not present

## 2022-12-23 LAB — POC URINALSYSI DIPSTICK (AUTOMATED)
Bilirubin, UA: NEGATIVE
Blood, UA: NEGATIVE
Glucose, UA: NEGATIVE
Ketones, UA: NEGATIVE
Nitrite, UA: NEGATIVE
Protein, UA: NEGATIVE
Spec Grav, UA: 1.025 (ref 1.010–1.025)
Urobilinogen, UA: 0.2 E.U./dL
pH, UA: 6 (ref 5.0–8.0)

## 2022-12-23 MED ORDER — CEPHALEXIN 500 MG PO CAPS: 500.0000 mg | ORAL_CAPSULE | Freq: Three times a day (TID) | ORAL | 0 refills | Status: AC

## 2022-12-23 NOTE — Assessment & Plan Note (Signed)
Chronic Controlled, Stable Continue Celexa 10 mg daily, alprazolam xr 0.5 mg daily in am, alprazolam 0.5 mg daily as needed only, remeron 30 mg HS

## 2022-12-23 NOTE — Assessment & Plan Note (Signed)
Acute Likely bacterial  Start Augmentin keflex 500 mg TID x 7 day otc cold medications Rest, fluid Call if no improvement

## 2022-12-23 NOTE — Progress Notes (Signed)
Subjective:    Patient ID: Hannah [name], female    DOB: 04/05/1951, 72 y.o.   MRN: [id number]      HPI Cathyann is here for No chief complaint on file.   Sinus symptoms-symptoms started 1 week ago-she states congestion, ear pain, PND, sinus pain and pressure, intermittent sore throat, cough, headaches and some lightheadedness/dizziness.  She has not had any shortness of breath or fevers.  Dysuria x 2 days, some odor to urine.  Some increased frquency of urination and urgency.     Medications and allergies reviewed with patient and updated if appropriate.  Current Outpatient Medications on File Prior to Visit  Medication Sig Dispense Refill   acetaminophen (TYLENOL) 325 MG tablet Take 325 mg by mouth every 6 (six) hours as needed for moderate pain.     ALPRAZolam (XANAX XR) 0.5 MG 24 hr tablet Take 1 tablet (0.5 mg total) by mouth in the morning. 30 tablet 5   ALPRAZolam (XANAX) 0.5 MG tablet TAKE 1 TABLET BY MOUTH THREE TIMES DAILY AS NEEDED FOR ANXIETY 90 tablet 0   aspirin 81 MG tablet Take 1 tablet (81 mg total) by mouth daily. Restart on 08/31/20 30 tablet    bisacodyl (DULCOLAX) 5 MG EC tablet Take 5 mg by mouth daily as needed for moderate constipation.     carboxymethylcellulose (REFRESH PLUS) 0.5 % SOLN Place 1-2 drops into both eyes 3 (three) times daily as needed (dry eyes).     Cholecalciferol (D3 PO) Take 1 capsule by mouth daily.     citalopram (CELEXA) 10 MG tablet Take 1 tablet (10 mg total) by mouth daily. 90 tablet 0   fluticasone (FLONASE) 50 MCG/ACT nasal spray USE 2 SPRAYS IN EACH NOSTRIL EVERY DAY 48 g 3   folic acid (FOLVITE) 1 MG tablet Take 1 tablet by mouth once daily 30 tablet 0   ketoconazole (NIZORAL) 2 % cream APPLY 1 APPLICATION TOPICALLY DAILY 30 g 0   Lacosamide 100 MG TABS Take 1 tablet (100 mg total) by mouth 2 (two) times daily. 60 tablet 3   levETIRAcetam (KEPPRA) 100 MG/ML solution TAKE 15 ML BY MOUTH  TWICE DAILY 473 mL 0    lidocaine-prilocaine (EMLA) cream Apply 1 Application topically as needed. 30 g 2   loratadine (CLARITIN) 10 MG tablet Take 10 mg by mouth daily as needed for allergies.     magnesium hydroxide (MILK OF MAGNESIA) 800 MG/5ML suspension Take 30 mLs by mouth daily as needed for constipation.     Menthol, Topical Analgesic, (BIOFREEZE EX) Apply 1 application topically as needed (pain).     mirtazapine (REMERON) 30 MG tablet Take 30 mg by mouth at bedtime.     olopatadine (PATANOL) 0.1 % ophthalmic solution Place 1 drop into both eyes 2 (two) times daily as needed for allergies.     omeprazole (PRILOSEC) 40 MG capsule Take 1 capsule (40 mg total) by mouth daily. 90 capsule 1   polyethylene glycol (MIRALAX / GLYCOLAX) 17 g packet Take 17 g by mouth 2 (two) times daily. 72 each 0   pravastatin (PRAVACHOL) 40 MG tablet TAKE 1 TABLET EVERY EVENING 90 tablet 3   Probiotic Product (PROBIOTIC DAILY PO) Take 1 capsule by mouth daily.     prochlorperazine (COMPAZINE) 10 MG tablet TAKE 1 TABLET BY MOUTH EVERY 6 HOURS AS NEEDED FOR NAUSEA OR VOMITING 30 tablet 0   simethicone (MYLICON) 80 MG chewable tablet Chew 1 tablet (80 mg total) by mouth  4 (four) times daily as needed for flatulence (Bloating). 30 tablet 0   sulfamethoxazole-trimethoprim (BACTRIM DS) 800-160 MG tablet Take 1 tablet by mouth 2 (two) times daily. 6 tablet 0   No current facility-administered medications on file prior to visit.    Review of Systems  Constitutional:  Negative for fever.  HENT:  Positive for congestion, ear pain, postnasal drip, sinus pressure, sinus pain and sore throat (intermittent).   Respiratory:  Positive for cough (at night). Negative for shortness of breath and wheezing.   Genitourinary:  Positive for dysuria, frequency and urgency. Negative for hematuria.       Urine odor  Neurological:  Positive for dizziness, light-headedness and headaches (sometimes).       Objective:   Vitals:   12/23/22 1412  BP: 124/64   Pulse: 72  Temp: 98.2 F (36.8 C)  SpO2: 97%   BP Readings from Last 3 Encounters:  12/23/22 124/64  12/18/22 115/79  11/27/22 136/81   Wt Readings from Last 3 Encounters:  12/23/22 128 lb (58.1 kg)  12/18/22 129 lb 4.8 oz (58.7 kg)  11/27/22 131 lb 8 oz (59.6 kg)   Body mass index is 24.19 kg/m.    Physical Exam Constitutional:      General: She is not in acute distress.    Appearance: Normal appearance. She is not ill-appearing.  HENT:     Head: Normocephalic and atraumatic.     Right Ear: Tympanic membrane, ear canal and external ear normal.     Left Ear: Tympanic membrane, ear canal and external ear normal.     Mouth/Throat:     Mouth: Mucous membranes are moist.     Pharynx: No oropharyngeal exudate or posterior oropharyngeal erythema.  Eyes:     Conjunctiva/sclera: Conjunctivae normal.  Cardiovascular:     Rate and Rhythm: Normal rate and regular rhythm.  Pulmonary:     Effort: Pulmonary effort is normal. No respiratory distress.     Breath sounds: Normal breath sounds. No wheezing or rales.  Abdominal:     General: There is no distension.     Tenderness: There is no abdominal tenderness. There is no right CVA tenderness, left CVA tenderness, guarding or rebound.  Musculoskeletal:     Cervical back: Neck supple. No tenderness.  Lymphadenopathy:     Cervical: No cervical adenopathy.  Skin:    General: Skin is warm and dry.  Neurological:     Mental Status: She is alert.            Assessment & Plan:    See Problem List for Assessment and Plan of chronic medical problems.

## 2022-12-23 NOTE — Patient Instructions (Addendum)
     Medications changes include :       A referral was ordered for XXX.     Someone will call you to schedule an appointment.    Return if symptoms worsen or fail to improve.  

## 2022-12-23 NOTE — Assessment & Plan Note (Signed)
Acute Urine dip consistent with UTI Will send urine for culture Take the antibiotic as prescribed.  Keflex 500 mg 3 times daily x 7 days-also being used to treat sinus infection Take tylenol if needed.   Increase your water intake.  Call if no improvement

## 2022-12-23 NOTE — Assessment & Plan Note (Signed)
Chronic   Lab Results  Component Value Date   HGBA1C 6.2 (A) 09/19/2022   HGBA1C 6.2 09/19/2022   HGBA1C 6.2 09/19/2022   HGBA1C 6.2 09/19/2022   Sugars well controlled Continue lifestyle control Stressed diabetic diet

## 2022-12-23 NOTE — Assessment & Plan Note (Signed)
Chronic Controlled, Stable Continue Celexa 10 mg daily, mirtazapine 30 mg HS 

## 2022-12-25 LAB — URINE CULTURE

## 2022-12-29 ENCOUNTER — Other Ambulatory Visit: Payer: Self-pay | Admitting: Internal Medicine

## 2023-01-13 ENCOUNTER — Other Ambulatory Visit: Payer: Self-pay | Admitting: Internal Medicine

## 2023-01-13 DIAGNOSIS — C3490 Malignant neoplasm of unspecified part of unspecified bronchus or lung: Secondary | ICD-10-CM

## 2023-01-16 ENCOUNTER — Other Ambulatory Visit: Payer: Self-pay | Admitting: Internal Medicine

## 2023-01-16 DIAGNOSIS — D496 Neoplasm of unspecified behavior of brain: Secondary | ICD-10-CM

## 2023-01-19 ENCOUNTER — Encounter: Payer: Self-pay | Admitting: Internal Medicine

## 2023-01-19 ENCOUNTER — Other Ambulatory Visit: Payer: Self-pay | Admitting: Internal Medicine

## 2023-01-26 ENCOUNTER — Telehealth: Payer: Self-pay | Admitting: *Deleted

## 2023-01-26 NOTE — Telephone Encounter (Signed)
Ms. Karcher daughter Elisha Headland [phone]) called.  Patient wants to ring bell when she comes in for a port flush on 01/29/23 at 1 pm.  She completed her 2 years of chemotherapy as of 12/18/2022 and daughter said patient forgot to ask when she saw Dr. Arbutus Ped on 12/18/2022 Message sent to Rush Barer, Chaplain requesting she to contact Ms. Talmadge Coventry and assist with scheduling.  Contacted Ms. Zeiger with this information. She verbalized understanding.

## 2023-01-29 ENCOUNTER — Inpatient Hospital Stay: Payer: Medicare HMO | Attending: Internal Medicine

## 2023-01-29 ENCOUNTER — Other Ambulatory Visit: Payer: Self-pay

## 2023-01-29 DIAGNOSIS — Z79899 Other long term (current) drug therapy: Secondary | ICD-10-CM | POA: Insufficient documentation

## 2023-01-29 DIAGNOSIS — Z452 Encounter for adjustment and management of vascular access device: Secondary | ICD-10-CM | POA: Insufficient documentation

## 2023-01-29 DIAGNOSIS — Z95828 Presence of other vascular implants and grafts: Secondary | ICD-10-CM

## 2023-01-29 DIAGNOSIS — C3411 Malignant neoplasm of upper lobe, right bronchus or lung: Secondary | ICD-10-CM | POA: Diagnosis not present

## 2023-01-29 DIAGNOSIS — C3490 Malignant neoplasm of unspecified part of unspecified bronchus or lung: Secondary | ICD-10-CM

## 2023-01-29 DIAGNOSIS — C3491 Malignant neoplasm of unspecified part of right bronchus or lung: Secondary | ICD-10-CM

## 2023-01-29 MED ORDER — HEPARIN SOD (PORK) LOCK FLUSH 100 UNIT/ML IV SOLN
500.0000 [IU] | Freq: Once | INTRAVENOUS | Status: AC
  Administered 2023-01-29: 500 [IU]

## 2023-01-29 MED ORDER — SODIUM CHLORIDE 0.9% FLUSH
10.0000 mL | Freq: Once | INTRAVENOUS | Status: AC
  Administered 2023-01-29: 10 mL

## 2023-02-10 ENCOUNTER — Other Ambulatory Visit: Payer: Self-pay | Admitting: Internal Medicine

## 2023-02-10 DIAGNOSIS — C3490 Malignant neoplasm of unspecified part of unspecified bronchus or lung: Secondary | ICD-10-CM

## 2023-02-10 DIAGNOSIS — D496 Neoplasm of unspecified behavior of brain: Secondary | ICD-10-CM

## 2023-02-12 ENCOUNTER — Encounter (INDEPENDENT_AMBULATORY_CARE_PROVIDER_SITE_OTHER): Payer: Self-pay

## 2023-03-04 ENCOUNTER — Telehealth: Payer: Self-pay | Admitting: *Deleted

## 2023-03-04 NOTE — Telephone Encounter (Signed)
Daughter Rayvon Char states she had been away from her mother for 2 weeks because she had covid. She has noticed changes in her mother's behavior. Keilin is having a hard time processing her thoughts, difficulty with name recall, trouble remembering code to her phone, is sleeping a lot and her left arm is drawing in especially her hand and wrist. Pinkey said yesterday she realized she had been clutching a paper towel in her left hand all day.  MRI is scheduled for 9/26, wants to know if Dr Barbaraann Cao thinks they should do scan earlier.

## 2023-03-04 NOTE — Telephone Encounter (Signed)
Notified [name] that it is OK to have scan moved earlier. Central scheduling number provided so she can change appt to work with her schedule

## 2023-03-06 ENCOUNTER — Ambulatory Visit (INDEPENDENT_AMBULATORY_CARE_PROVIDER_SITE_OTHER): Payer: Medicare HMO | Admitting: Nurse Practitioner

## 2023-03-06 ENCOUNTER — Encounter: Payer: Self-pay | Admitting: Nurse Practitioner

## 2023-03-06 VITALS — BP 128/68 | HR 68 | Temp 97.4°F | Wt 128.6 lb

## 2023-03-06 DIAGNOSIS — D649 Anemia, unspecified: Secondary | ICD-10-CM

## 2023-03-06 DIAGNOSIS — R5383 Other fatigue: Secondary | ICD-10-CM

## 2023-03-06 DIAGNOSIS — R7301 Impaired fasting glucose: Secondary | ICD-10-CM

## 2023-03-06 DIAGNOSIS — R399 Unspecified symptoms and signs involving the genitourinary system: Secondary | ICD-10-CM

## 2023-03-06 LAB — CBC WITH DIFFERENTIAL/PLATELET
Basophils Absolute: 0 K/uL (ref 0.0–0.1)
Basophils Relative: 0.7 % (ref 0.0–3.0)
Eosinophils Absolute: 0.1 K/uL (ref 0.0–0.7)
Eosinophils Relative: 1.5 % (ref 0.0–5.0)
HCT: 37.5 % (ref 36.0–46.0)
Hemoglobin: 11.9 g/dL — ABNORMAL LOW (ref 12.0–15.0)
Lymphocytes Relative: 40.1 % (ref 12.0–46.0)
Lymphs Abs: 2 K/uL (ref 0.7–4.0)
MCHC: 31.6 g/dL (ref 30.0–36.0)
MCV: 83.4 fl (ref 78.0–100.0)
Monocytes Absolute: 0.4 K/uL (ref 0.1–1.0)
Monocytes Relative: 9.1 % (ref 3.0–12.0)
Neutro Abs: 2.4 K/uL (ref 1.4–7.7)
Neutrophils Relative %: 48.6 % (ref 43.0–77.0)
Platelets: 236 K/uL (ref 150.0–400.0)
RBC: 4.5 Mil/uL (ref 3.87–5.11)
RDW: 13.2 % (ref 11.5–15.5)
WBC: 4.9 K/uL (ref 4.0–10.5)

## 2023-03-06 LAB — BASIC METABOLIC PANEL
BUN: 8 mg/dL (ref 6–23)
CO2: 31 meq/L (ref 19–32)
Calcium: 10.1 mg/dL (ref 8.4–10.5)
Chloride: 103 meq/L (ref 96–112)
Creatinine, Ser: 0.73 mg/dL (ref 0.40–1.20)
GFR: 80.92 mL/min (ref >60.00)
Glucose, Bld: 90 mg/dL (ref 70–99)
Potassium: 5 meq/L (ref 3.5–5.1)
Sodium: 141 meq/L (ref 135–145)

## 2023-03-06 LAB — CK: Total CK: 264 U/L — ABNORMAL HIGH (ref 7–177)

## 2023-03-06 LAB — IBC + FERRITIN
Ferritin: 17.3 ng/mL (ref 10.0–291.0)
Iron: 45 ug/dL (ref 42–145)
Saturation Ratios: 10.3 % — ABNORMAL LOW (ref 20.0–50.0)
TIBC: 438.2 ug/dL (ref 250.0–450.0)
Transferrin: 313 mg/dL (ref 212.0–360.0)

## 2023-03-06 LAB — POC URINALSYSI DIPSTICK (AUTOMATED)
Bilirubin, UA: NEGATIVE
Blood, UA: NEGATIVE
Glucose, UA: NEGATIVE
Ketones, UA: NEGATIVE
Leukocytes, UA: NEGATIVE
Nitrite, UA: NEGATIVE
Protein, UA: NEGATIVE
Spec Grav, UA: 1.01
Urobilinogen, UA: 0.2 U/dL
pH, UA: 6

## 2023-03-06 LAB — HEMOGLOBIN A1C: Hgb A1c MFr Bld: 6.1 % (ref 4.6–6.5)

## 2023-03-06 LAB — SEDIMENTATION RATE: Sed Rate: 54 mm/h — ABNORMAL HIGH (ref 0–30)

## 2023-03-06 LAB — TSH: TSH: 0.64 u[IU]/mL (ref 0.35–5.50)

## 2023-03-06 MED ORDER — SULFAMETHOXAZOLE-TRIMETHOPRIM 800-160 MG PO TABS
1.0000 | ORAL_TABLET | Freq: Two times a day (BID) | ORAL | 0 refills | Status: DC
Start: 2023-03-06 — End: 2023-03-11

## 2023-03-06 NOTE — Assessment & Plan Note (Signed)
Acute of chronic fatigue, worse in last 1week. Med side effect vs cancer treatment?  POCT UA is inconclusive, sent urine for culture Also check CBC, BMP, THYROID, A1c, CK

## 2023-03-06 NOTE — Assessment & Plan Note (Addendum)
Check cbc and iron panel No hematuria or blood in stool

## 2023-03-06 NOTE — Patient Instructions (Addendum)
Go to lab Start bactrim. Take with food Ok to use tylenol OVER THE COUNTER for pain as needed Maintain adequate oral hydration. Urine sent for culture

## 2023-03-06 NOTE — Progress Notes (Signed)
Acute Office Visit  Subjective:    Patient ID: Hannah [name], female    DOB: 12/29/1950, 72 y.o.   MRN: [id number]  Chief Complaint  Patient presents with   Urinary Frequency    Frequent urge to urinate, confusion, lower back pain for 1 week    Accompanied by daughter  Urinary Frequency  This is a new problem. The current episode started in the past 7 days. The problem occurs every urination. The problem has been unchanged. The pain is at a severity of 0/10. The patient is experiencing no pain. There has been no fever. She is Not sexually active. There is No history of pyelonephritis. Associated symptoms include frequency and hesitancy. Pertinent negatives include no chills, discharge, flank pain, hematuria, nausea, possible pregnancy, sweats, urgency or vomiting. She has tried nothing for the symptoms. Her past medical history is significant for urinary stasis. There is no history of catheterization, kidney stones, recurrent UTIs, a single kidney or a urological procedure.  Chronic constipation: use of miralax, Last BM this AM-soft , No diarrhea, no blood in stool or urine Hx of urinary hesitancy No incontinence, no retention, no pelvic surgery in past  Fatigue Acute of chronic fatigue, worse in last 1week. Med side effect vs cancer treatment?  POCT UA is inconclusive, sent urine for culture Also check CBC, BMP, THYROID, A1c, CK  Anemia Check cbc and iron panel No hematuria or blood in stool  Wt Readings from Last 3 Encounters:  03/06/23 128 lb 9.6 oz (58.3 kg)  12/23/22 128 lb (58.1 kg)  12/18/22 129 lb 4.8 oz (58.7 kg)    BP Readings from Last 3 Encounters:  03/06/23 128/68  12/23/22 124/64  12/18/22 115/79       03/06/2023    2:32 PM 12/21/2017    8:43 AM  MMSE - Mini Mental State Exam  Orientation to time 2 5  Orientation to Place 4 5  Registration 2 3  Attention/ Calculation 0 5  Recall 3 3  Language- name 2 objects 2 2  Language- repeat 1 1  Language- follow  3 step command 3 3  Language- read & follow direction 1 1  Write a sentence 1 1  Copy design 0 1  Total score 19 30    Outpatient Medications Prior to Visit  Medication Sig   acetaminophen (TYLENOL) 325 MG tablet Take 325 mg by mouth every 6 (six) hours as needed for moderate pain.   ALPRAZolam (XANAX XR) 0.5 MG 24 hr tablet Take 1 tablet (0.5 mg total) by mouth in the morning.   ALPRAZolam (XANAX) 0.5 MG tablet TAKE 1 TABLET BY MOUTH THREE TIMES DAILY AS NEEDED FOR ANXIETY   aspirin 81 MG tablet Take 1 tablet (81 mg total) by mouth daily. Restart on 08/31/20   bisacodyl (DULCOLAX) 5 MG EC tablet Take 5 mg by mouth daily as needed for moderate constipation.   carboxymethylcellulose (REFRESH PLUS) 0.5 % SOLN Place 1-2 drops into both eyes 3 (three) times daily as needed (dry eyes).   Cholecalciferol (D3 PO) Take 1 capsule by mouth daily.   citalopram (CELEXA) 10 MG tablet Take 1 tablet by mouth once daily   fluticasone (FLONASE) 50 MCG/ACT nasal spray USE 2 SPRAYS IN EACH NOSTRIL EVERY DAY   folic acid (FOLVITE) 1 MG tablet Take 1 tablet by mouth once daily   ketoconazole (NIZORAL) 2 % cream APPLY 1 APPLICATION TOPICALLY DAILY   Lacosamide 100 MG TABS Take 1 tablet by mouth twice  daily   levETIRAcetam (KEPPRA) 100 MG/ML solution TAKE 15 ML BY MOUTH  TWICE DAILY   lidocaine-prilocaine (EMLA) cream Apply 1 Application topically as needed.   loratadine (CLARITIN) 10 MG tablet Take 10 mg by mouth daily as needed for allergies.   magnesium hydroxide (MILK OF MAGNESIA) 800 MG/5ML suspension Take 30 mLs by mouth daily as needed for constipation.   Menthol, Topical Analgesic, (BIOFREEZE EX) Apply 1 application topically as needed (pain).   mirtazapine (REMERON) 30 MG tablet Take 30 mg by mouth at bedtime.   olopatadine (PATANOL) 0.1 % ophthalmic solution Place 1 drop into both eyes 2 (two) times daily as needed for allergies.   omeprazole (PRILOSEC) 40 MG capsule Take 1 capsule (40 mg total) by mouth  daily.   polyethylene glycol (MIRALAX / GLYCOLAX) 17 g packet Take 17 g by mouth 2 (two) times daily.   pravastatin (PRAVACHOL) 40 MG tablet TAKE 1 TABLET EVERY EVENING   Probiotic Product (PROBIOTIC DAILY PO) Take 1 capsule by mouth daily.   prochlorperazine (COMPAZINE) 10 MG tablet TAKE 1 TABLET BY MOUTH EVERY 6 HOURS AS NEEDED FOR NAUSEA OR VOMITING   simethicone (MYLICON) 80 MG chewable tablet Chew 1 tablet (80 mg total) by mouth 4 (four) times daily as needed for flatulence (Bloating).   [DISCONTINUED] sulfamethoxazole-trimethoprim (BACTRIM DS) 800-160 MG tablet Take 1 tablet by mouth 2 (two) times daily.   No facility-administered medications prior to visit.   Reviewed past medical and social history.  Review of Systems  Constitutional:  Positive for fatigue. Negative for chills, fever and unexpected weight change.  HENT: Negative.    Respiratory: Negative.    Cardiovascular: Negative.   Gastrointestinal: Negative.  Negative for nausea and vomiting.  Genitourinary:  Positive for frequency and hesitancy. Negative for flank pain, hematuria and urgency.  Psychiatric/Behavioral:  Positive for decreased concentration. Negative for agitation, behavioral problems, confusion, self-injury and sleep disturbance. The patient is not nervous/anxious and is not hyperactive.       Objective:    Physical Exam Vitals and nursing note reviewed.  Cardiovascular:     Rate and Rhythm: Normal rate.     Pulses: Normal pulses.  Pulmonary:     Effort: Pulmonary effort is normal.  Abdominal:     General: There is no distension.     Tenderness: There is no abdominal tenderness. There is no right CVA tenderness, left CVA tenderness or guarding.  Musculoskeletal:     Cervical back: Normal range of motion and neck supple.     Right lower leg: No edema.     Left lower leg: No edema.  Lymphadenopathy:     Cervical: No cervical adenopathy.  Neurological:     Mental Status: She is alert and oriented to  person, place, and time.  Psychiatric:        Attention and Perception: Attention normal.        Mood and Affect: Affect is flat.        Speech: Speech normal.        Behavior: Behavior is cooperative.        Cognition and Memory: Cognition normal.    BP 128/68   Pulse 68   Temp (!) 97.4 F (36.3 C) (Temporal)   Wt 128 lb 9.6 oz (58.3 kg)   SpO2 98%   BMI 24.30 kg/m    Results for orders placed or performed in visit on 03/06/23  POCT Urinalysis Dipstick (Automated)  Result Value Ref Range   Color, UA  Yellow    Clarity, UA Clear    Glucose, UA Negative Negative   Bilirubin, UA Negative    Ketones, UA Negative    Spec Grav, UA 1.010 1.010 - 1.025   Blood, UA Negative    pH, UA 6.0 5.0 - 8.0   Protein, UA Negative Negative   Urobilinogen, UA 0.2 0.2 or 1.0 E.U./dL   Nitrite, UA Negative    Leukocytes, UA Negative Negative      Assessment & Plan:   Problem List Items Addressed This Visit     Anemia    Check cbc and iron panel No hematuria or blood in stool      Relevant Orders   IBC + Ferritin   CBC with Differential/Platelet   Fatigue    Acute of chronic fatigue, worse in last 1week. Med side effect vs cancer treatment?  POCT UA is inconclusive, sent urine for culture Also check CBC, BMP, THYROID, A1c, CK      Relevant Orders   Basic metabolic panel   TSH   CK   Sedimentation rate   Other Visit Diagnoses     UTI symptoms    -  Primary   Relevant Medications   sulfamethoxazole-trimethoprim (BACTRIM DS) 800-160 MG tablet   Other Relevant Orders   POCT Urinalysis Dipstick (Automated) (Completed)   Urine Culture   CBC with Differential/Platelet   Impaired fasting glucose       Relevant Orders   Hemoglobin A1c   Basic metabolic panel      Meds ordered this encounter  Medications   sulfamethoxazole-trimethoprim (BACTRIM DS) 800-160 MG tablet    Sig: Take 1 tablet by mouth 2 (two) times daily.    Dispense:  6 tablet    Refill:  0    Order  Specific Question:   Supervising Provider    Answer:   Nadene Rubins ALFRED [[id number]]   No follow-ups on file.    Alysia Penna, NP

## 2023-03-07 LAB — URINE CULTURE
MICRO NUMBER:: 15432120
Result:: NO GROWTH
SPECIMEN QUALITY:: ADEQUATE

## 2023-03-11 ENCOUNTER — Other Ambulatory Visit: Payer: Self-pay | Admitting: Internal Medicine

## 2023-03-11 ENCOUNTER — Ambulatory Visit (HOSPITAL_COMMUNITY)
Admission: RE | Admit: 2023-03-11 | Discharge: 2023-03-11 | Disposition: A | Discharge status: Home / Self Care | Payer: Medicare HMO | Source: Ambulatory Visit | Attending: Internal Medicine | Admitting: Internal Medicine

## 2023-03-11 ENCOUNTER — Encounter: Payer: Self-pay | Admitting: Physician Assistant

## 2023-03-11 ENCOUNTER — Ambulatory Visit: Payer: Medicare HMO | Admitting: Physician Assistant

## 2023-03-11 VITALS — BP 104/60 | HR 73 | Ht 61.0 in | Wt 126.8 lb

## 2023-03-11 DIAGNOSIS — C3490 Malignant neoplasm of unspecified part of unspecified bronchus or lung: Secondary | ICD-10-CM | POA: Insufficient documentation

## 2023-03-11 DIAGNOSIS — C7931 Secondary malignant neoplasm of brain: Secondary | ICD-10-CM

## 2023-03-11 DIAGNOSIS — K5909 Other constipation: Secondary | ICD-10-CM | POA: Diagnosis not present

## 2023-03-11 DIAGNOSIS — C729 Malignant neoplasm of central nervous system, unspecified: Secondary | ICD-10-CM | POA: Diagnosis not present

## 2023-03-11 MED ORDER — GADOBUTROL 1 MMOL/ML IV SOLN
5.0000 mL | Freq: Once | INTRAVENOUS | Status: AC | PRN
  Administered 2023-03-11: 5 mL via INTRAVENOUS

## 2023-03-11 MED ORDER — LINACLOTIDE 72 MCG PO CAPS: 72.0000 ug | ORAL_CAPSULE | Freq: Every day | ORAL | 5 refills | Status: DC

## 2023-03-11 MED ORDER — HEPARIN SOD (PORK) LOCK FLUSH 100 UNIT/ML IV SOLN
500.0000 [IU] | Freq: Once | INTRAVENOUS | Status: AC
  Administered 2023-03-11: 500 [IU] via INTRAVENOUS

## 2023-03-11 NOTE — Patient Instructions (Signed)
Linzess works best when taken once a day every day, on an empty stomach, at least 30 minutes before your first meal of the day.  When Linzess is taken daily as directed:  *Constipation relief is typically felt in about a week *IBS-C patients may begin to experience relief from belly pain and overall abdominal symptoms (pain, discomfort, and bloating) in about 1 week,   with symptoms typically improving over 12 weeks.  Diarrhea may occur in the first 2 weeks -keep taking it.  The diarrhea should go away and you should start having normal, complete, full bowel movements. It may be helpful to start treatment when you can be near the comfort of your own bathroom, such as a weekend.   We have provided you with Linzess samples to try today and we are also sending in a Rx for this.   _______________________________________________________  If your blood pressure at your visit was 140/90 or greater, please contact your primary care physician to follow up on this.  _______________________________________________________  If you are age 36 or older, your body mass index should be between 23-30. Your Body mass index is 23.96 kg/m. If this is out of the aforementioned range listed, please consider follow up with your Primary Care Provider.  If you are age 27 or younger, your body mass index should be between 19-25. Your Body mass index is 23.96 kg/m. If this is out of the aformentioned range listed, please consider follow up with your Primary Care Provider.   ________________________________________________________  The [facility] providers would like to encourage you to use [facility] to communicate with providers for non-urgent requests or questions.  Due to long hold times on the telephone, sending your provider a message by [facility] may be a faster and more efficient way to get a response.  Please allow 48 business hours for a response.  Please remember that this is for non-urgent requests.   _______________________________________________________  I appreciate the opportunity to care for you.  Hyacinth Meeker, PA-C

## 2023-03-11 NOTE — Progress Notes (Signed)
Chief Complaint: Discuss repeat colonoscopy, iron deficiency anemia  HPI:    Hannah [name] is a 72 year old female with a past medical history as listed below including non-small cell lung cancer with brain mets, PVD, GERD and multiple others, known to Dr. Rhea Belton, who was referred to me by Pincus Sanes, MD for discussion of a repeat colonoscopy, iron deficiency anemia.    05/20/2019 colonoscopy with hemorrhoids, 4 polyps removed from the transverse, sigmoid and proximal rectum, 2 of the 4 adenomatous the other 2 hyperplastic.  An area of discrete congested and edematous mucosa in the distal rectum.  Biopsies and showed prolapse related changes.  Left-sided diverticulosis and small hemorrhoids.  Repeat recommended in 5 years (new recommendation 7 years)    05/31/2019 office visit with Dr. Rhea Belton for follow-up.  At that time continued with constipation on a very high dose of senna.  Recommended dynamic pelvic MRI versus anorectal manometry.  Also discussed possible pelvic floor therapy.  I would suggest that she try to decrease dose of senna.  Noted GERD was well-controlled on Meprazole 40 twice daily.    12/16/2022 PET scan was stable.  No new progression to suggest recurrent or metastatic disease in the chest abdomen or pelvis.  Stable 3 mm left lower lobe pulmonary nodule.    03/06/2023 CBC with a hemoglobin 11.9 (10.9 on 11/27/2022, normal on 08/14/2022).  Iron panel with a percent saturation low at 10.3.  2 years ago iron deficient.    Today, patient presents to clinic accompanied by her daughter.  She explains that she has been constipated for a long as she can remember.  Apparently she had an impaction years ago and never wanted to be impacted again so she has remained on a lot of different laxative products.  At 1 point when she saw Dr. Rhea Belton back in 2020 was on 14 Senokot a day.  Now takes either 60 mL of milk of magnesia or 2 doses of MiraLAX and this does seem to keep her bowels moving daily, but  she often feels bloated and like she is not fully emptying.  Apparently also feels something kind of floating around her back to her front which she associates with this constipation.  Reminds me she was tried on Linzess in the past but unsure of dose and it caused diarrhea, though she did not try this long because it was urgent per her.  She is asking if there is a better option for her.    Denies fever, chills, weight loss or blood in her stool.  Past Medical History:  Diagnosis Date   Allergy    Anemia    Anxiety    Arthritis    Carpal tunnel syndrome    Constipation    senna C stool softeners help    Depression    Diverticulosis    Dyslipidemia    External hemorrhoids    GERD (gastroesophageal reflux disease)    Heart murmur    mild-moderate AR   Hiatal hernia    Hyperlipidemia    on meds    Hypertension    Internal hemorrhoids    lung ca with brain mets 07/2020   Osteoarthritis    Pre-diabetes    PVD (peripheral vascular disease) (HCC)    moderate carotid disease   RBBB    Smoker    Vocal cord polyps     Past Surgical History:  Procedure Laterality Date   ABDOMINAL HYSTERECTOMY  1994   APPLICATION OF CRANIAL NAVIGATION  N/A 08/24/2020   Procedure: APPLICATION OF CRANIAL NAVIGATION;  Surgeon: Jadene Pierini, MD;  Location: [facility] OR;  Service: Neurosurgery;  Laterality: N/A;   COLONOSCOPY     COLONOSCOPY W/ POLYPECTOMY  2009   CRANIOTOMY Left 08/24/2020   Procedure: Left Craniotomy for Tumor Resection with Brainlab;  Surgeon: Jadene Pierini, MD;  Location: [facility] OR;  Service: Neurosurgery;  Laterality: Left;   HEMORRHOID SURGERY     INTERCOSTAL NERVE BLOCK Right 09/24/2020   Procedure: INTERCOSTAL NERVE BLOCK;  Surgeon: Loreli Slot, MD;  Location: [facility] OR;  Service: Thoracic;  Laterality: Right;   IR IMAGING GUIDED PORT INSERTION  05/15/2022   JOINT REPLACEMENT     LYMPH NODE DISSECTION Right 09/24/2020   Procedure: LYMPH NODE DISSECTION;  Surgeon:  Loreli Slot, MD;  Location: [facility] OR;  Service: Thoracic;  Laterality: Right;   POLYPECTOMY     right total knee arthroplasty     Dr. Sherle Poe 06-04-18   SPINE SURGERY  08/16/2009   TOTAL KNEE ARTHROPLASTY Left 02/13/2014   Procedure: TOTAL KNEE ARTHROPLASTY;  Surgeon: Harvie Junior, MD;  Location: [facility] OR;  Service: Orthopedics;  Laterality: Left;   TOTAL KNEE ARTHROPLASTY Right 06/04/2018   Procedure: RIGHT TOTAL KNEE ARTHROPLASTY;  Surgeon: Jodi Geralds, MD;  Location: [facility] ORS;  Service: Orthopedics;  Laterality: Right;  Adductor Block   TUBAL LIGATION      Current Outpatient Medications  Medication Sig Dispense Refill   acetaminophen (TYLENOL) 325 MG tablet Take 325 mg by mouth every 6 (six) hours as needed for moderate pain.     ALPRAZolam (XANAX XR) 0.5 MG 24 hr tablet Take 1 tablet (0.5 mg total) by mouth in the morning. 30 tablet 5   ALPRAZolam (XANAX) 0.5 MG tablet TAKE 1 TABLET BY MOUTH THREE TIMES DAILY AS NEEDED FOR ANXIETY 90 tablet 0   aspirin 81 MG tablet Take 1 tablet (81 mg total) by mouth daily. Restart on 08/31/20 30 tablet    bisacodyl (DULCOLAX) 5 MG EC tablet Take 5 mg by mouth daily as needed for moderate constipation.     carboxymethylcellulose (REFRESH PLUS) 0.5 % SOLN Place 1-2 drops into both eyes 3 (three) times daily as needed (dry eyes).     Cholecalciferol (D3 PO) Take 1 capsule by mouth daily.     citalopram (CELEXA) 10 MG tablet Take 1 tablet by mouth once daily 90 tablet 0   fluticasone (FLONASE) 50 MCG/ACT nasal spray USE 2 SPRAYS IN EACH NOSTRIL EVERY DAY 48 g 3   folic acid (FOLVITE) 1 MG tablet Take 1 tablet by mouth once daily 30 tablet 0   ketoconazole (NIZORAL) 2 % cream APPLY 1 APPLICATION TOPICALLY DAILY 30 g 0   Lacosamide 100 MG TABS Take 1 tablet by mouth twice daily 60 tablet 0   levETIRAcetam (KEPPRA) 100 MG/ML solution TAKE 15 ML BY MOUTH  TWICE DAILY 473 mL 0   lidocaine-prilocaine (EMLA) cream Apply 1 Application topically as needed. 30 g 2    loratadine (CLARITIN) 10 MG tablet Take 10 mg by mouth daily as needed for allergies.     magnesium hydroxide (MILK OF MAGNESIA) 800 MG/5ML suspension Take 30 mLs by mouth daily as needed for constipation.     Menthol, Topical Analgesic, (BIOFREEZE EX) Apply 1 application topically as needed (pain).     mirtazapine (REMERON) 30 MG tablet Take 30 mg by mouth at bedtime.     olopatadine (PATANOL) 0.1 % ophthalmic solution Place 1 drop  into both eyes 2 (two) times daily as needed for allergies.     omeprazole (PRILOSEC) 40 MG capsule Take 1 capsule (40 mg total) by mouth daily. 90 capsule 1   polyethylene glycol (MIRALAX / GLYCOLAX) 17 g packet Take 17 g by mouth 2 (two) times daily. 72 each 0   pravastatin (PRAVACHOL) 40 MG tablet TAKE 1 TABLET EVERY EVENING 90 tablet 3   Probiotic Product (PROBIOTIC DAILY PO) Take 1 capsule by mouth daily.     prochlorperazine (COMPAZINE) 10 MG tablet TAKE 1 TABLET BY MOUTH EVERY 6 HOURS AS NEEDED FOR NAUSEA OR VOMITING 30 tablet 0   simethicone (MYLICON) 80 MG chewable tablet Chew 1 tablet (80 mg total) by mouth 4 (four) times daily as needed for flatulence (Bloating). 30 tablet 0   sulfamethoxazole-trimethoprim (BACTRIM DS) 800-160 MG tablet Take 1 tablet by mouth 2 (two) times daily. 6 tablet 0   No current facility-administered medications for this visit.    Allergies as of 03/11/2023 - Review Complete 03/06/2023  Allergen Reaction Noted   Amlodipine Swelling and Rash 10/01/2011   Chantix [varenicline tartrate] Shortness Of Breath, Swelling, and Other (See Comments) 10/01/2011   Clarithromycin Rash 10/01/2011   Lisinopril Hives 10/01/2011   Simvastatin Hives 10/01/2011   Wellbutrin [bupropion hcl] Hives 10/01/2011   Lipitor [atorvastatin]  11/29/2013   Sertraline  07/24/2020    Family History  Problem Relation Age of Onset   Cancer Father    Prostate cancer Father    Diabetes Sister    Cancer Sister    Stroke Maternal Grandfather    Colitis  Maternal Aunt    Breast cancer Maternal Aunt    Colon cancer Neg Hx    Colon polyps Neg Hx    Rectal cancer Neg Hx    Stomach cancer Neg Hx    Esophageal cancer Neg Hx     Social History   Socioeconomic History   Marital status: Married    Spouse name: Not on file   Number of children: 2   Years of education: Not on file   Highest education level: 12th grade  Occupational History   Not on file  Tobacco Use   Smoking status: Former    Current packs/day: 0.00    Average packs/day: 0.3 packs/day for 40.0 years (10.0 ttl pk-yrs)    Types: Cigarettes    Start date: 07/09/1980    Quit date: 07/09/2020    Years since quitting: 2.6    Passive exposure: Never   Smokeless tobacco: Never   Tobacco comments:    PACK WILL LAST 3 days  Vaping Use   Vaping status: Never Used  Substance and Sexual Activity   Alcohol use: No   Drug use: No   Sexual activity: Not Currently  Other Topics Concern   Not on file  Social History Narrative   Not on file   Social Determinants of Health   Financial Resource Strain: Low Risk  (09/19/2022)   Overall Financial Resource Strain (CARDIA)    Difficulty of Paying Living Expenses: Not hard at all  Food Insecurity: No Food Insecurity (09/24/2022)   Hunger Vital Sign    Worried About Running Out of Food in the Last Year: Never true    Ran Out of Food in the Last Year: Never true  Transportation Needs: No Transportation Needs (09/24/2022)   PRAPARE - Administrator, Civil Service (Medical): No    Lack of Transportation (Non-Medical): No  Physical Activity: Insufficiently  Active (09/19/2022)   Exercise Vital Sign    Days of Exercise per Week: 3 days    Minutes of Exercise per Session: 30 min  Stress: No Stress Concern Present (09/19/2022)   [employer] of Occupational Health - Occupational Stress Questionnaire    Feeling of Stress : Only a little  Social Connections: Socially Integrated (09/19/2022)   Social Connection and Isolation  Panel [NHANES]    Frequency of Communication with Friends and Family: More than three times a week    Frequency of Social Gatherings with Friends and Family: More than three times a week    Attends Religious Services: More than 4 times per year    Active Member of [employer] or Organizations: Yes    Attends Banker Meetings: 1 to 4 times per year    Marital Status: Married  Catering manager Violence: Not At Risk (04/14/2022)   Humiliation, Afraid, Rape, and Kick questionnaire    Fear of Current or Ex-Partner: No    Emotionally Abused: No    Physically Abused: No    Sexually Abused: No    Review of Systems:    Constitutional: No weight loss, fever or chills Skin: No rash Cardiovascular: No chest pain Respiratory: No SOB Gastrointestinal: See HPI and otherwise negative Genitourinary: No dysuria or change in urinary frequency Neurological: No headache, dizziness or syncope Musculoskeletal: No new muscle or joint pain Hematologic: No bleeding Psychiatric: No history of depression or anxiety   Physical Exam:  Vital signs: BP 104/60   Pulse 73   Ht 5\' 1"  (1.549 m)   Wt 126 lb 12.8 oz (57.5 kg)   BMI 23.96 kg/m    Constitutional:   Pleasant Elderly AA female appears to be in NAD, Well developed, Well nourished, alert and cooperative Head:  Normocephalic and atraumatic. Eyes:   PEERL, EOMI. No icterus. Conjunctiva pink. Ears:  Normal auditory acuity. Neck:  Supple Throat: Oral cavity and pharynx without inflammation, swelling or lesion.  Respiratory: Respirations even and unlabored. Lungs clear to auscultation bilaterally.   No wheezes, crackles, or rhonchi.  Cardiovascular: Normal S1, S2. No MRG. Regular rate and rhythm. No peripheral edema, cyanosis or pallor.  Gastrointestinal:  Soft, nondistended, nontender. No rebound or guarding. Normal bowel sounds. No appreciable masses or hepatomegaly. Rectal:  Not performed.  Msk:  Symmetrical without gross deformities. Without  edema, no deformity or joint abnormality.  Neurologic:  Alert and  oriented x4;  grossly normal neurologically.  Skin:   Dry and intact without significant lesions or rashes. Psychiatric:Demonstrates good judgement and reason without abnormal affect or behaviors.  RELEVANT LABS AND IMAGING: CBC    Component Value Date/Time   WBC 4.9 03/06/2023 1212   RBC 4.50 03/06/2023 1212   HGB 11.9 (L) 03/06/2023 1212   HGB 10.9 (L) 11/27/2022 1026   HCT 37.5 03/06/2023 1212   PLT 236.0 03/06/2023 1212   PLT 268 11/27/2022 1026   MCV 83.4 03/06/2023 1212   MCV 80.8 09/16/2015 1138   MCH 28.7 11/27/2022 1026   MCHC 31.6 03/06/2023 1212   RDW 13.2 03/06/2023 1212   LYMPHSABS 2.0 03/06/2023 1212   MONOABS 0.4 03/06/2023 1212   EOSABS 0.1 03/06/2023 1212   BASOSABS 0.0 03/06/2023 1212    CMP     Component Value Date/Time   NA 141 03/06/2023 1212   K 5.0 03/06/2023 1212   CL 103 03/06/2023 1212   CO2 31 03/06/2023 1212   GLUCOSE 90 03/06/2023 1212   BUN 8  03/06/2023 1212   CREATININE 0.73 03/06/2023 1212   CREATININE 0.74 11/27/2022 1026   CREATININE 0.79 03/14/2020 1016   CALCIUM 10.1 03/06/2023 1212   PROT 6.2 (L) 11/27/2022 1026   ALBUMIN 3.5 11/27/2022 1026   AST 22 11/27/2022 1026   ALT 12 11/27/2022 1026   ALKPHOS 66 11/27/2022 1026   BILITOT 0.2 (L) 11/27/2022 1026   GFRNONAA >60 11/27/2022 1026   GFRNONAA 75 03/14/2020 1016   GFRAA 87 03/14/2020 1016    Assessment: 1.  Chronic constipation: With rectal prolapse and pressure/dysfunction defecation, previously discussed back in 2020 with Dr. Rhea Belton who discussed an MR defecography, but this was never done, also discussed possible pelvic floor therapy, currently patient is having a daily bowel movement with either milk of magnesia 30 to 60 mL or MiraLAX 2 doses 2.  IDA: In the setting of known lung cancer with mets to the brain, following with hematology oncology 3.  Adenomatous polyps: Repeat colonoscopy recommended December  2025  Plan: 1.  At this point patient would like something easy to manage her constipation.  We will trial Linzess 72 mcg daily 30 minutes before breakfast.  Patient given samples and prescribed #30 with 3 refills today.  She will take this consistently 30 minutes before breakfast for the next couple of weeks to let me know how it works for her.  We can increase as needed or change products if too expensive for her. 2.  Not sure at this point how much benefit we would get from doing an MR defecography or pelvic floor therapy, but these could be rediscussed in the future if needed. 3.  Patient to follow clinic with me in 2 months.  She will call before then and let me know how she is doing so we can manage things over the phone.  Hannah Meeker, PA-C [facility] 03/11/2023, 2:33 PM  Cc: Pincus Sanes, MD

## 2023-03-12 ENCOUNTER — Telehealth: Payer: Self-pay | Admitting: *Deleted

## 2023-03-12 ENCOUNTER — Encounter: Payer: Self-pay | Admitting: Internal Medicine

## 2023-03-12 NOTE — Telephone Encounter (Signed)
PC to patient's daughter, [name].  Informed a phone appointment with Dr Barbaraann Cao has been scheduled on 03/19/23 at 2:30.  She verbalizes understanding.

## 2023-03-12 NOTE — Telephone Encounter (Signed)
-----   Message from Henreitta Leber sent at 03/12/2023  1:51 PM EDT ----- Regarding: RE: Changes no ----- Message ----- From: Arville Care, RN Sent: 03/12/2023   1:47 PM EDT To: Henreitta Leber, MD Subject: RE: Changes                                    Her daughter prefers a phone visit which will be scheduled 03/19/23 at 2:00.  Do you still need to see her in person on 10/1? ----- Message ----- From: Henreitta Leber, MD Sent: 03/12/2023  11:56 AM EDT To: Arville Care, RN Subject: RE: Changes                                    We can just bring her in for an evaluation next week, otherwise phone visit is fine ----- Message ----- From: Arville Care, RN Sent: 03/12/2023  10:19 AM EDT To: Henreitta Leber, MD Subject: Changes                                        Ms Runnion's daughter, [name], called.  Patient had a brain MRI yesterday.  [name] says she can see some changes, patient is a little more confused, is "slow to process things."  She had some dexamethasone left over & [name] started giving her 0.5 mg today, hoping this might help.  Ms Boeing has an appointment with you on 10/1, [name] is asking if a phone visit might be possible to discuss MRI results before the 1st.

## 2023-03-13 ENCOUNTER — Inpatient Hospital Stay: Payer: Medicare HMO | Attending: Internal Medicine

## 2023-03-13 ENCOUNTER — Ambulatory Visit (HOSPITAL_COMMUNITY)
Admission: RE | Admit: 2023-03-13 | Discharge: 2023-03-13 | Disposition: A | Discharge status: Home / Self Care | Payer: Medicare HMO | Source: Ambulatory Visit | Attending: Internal Medicine | Admitting: Internal Medicine

## 2023-03-13 ENCOUNTER — Encounter (HOSPITAL_COMMUNITY): Payer: Self-pay

## 2023-03-13 DIAGNOSIS — C3490 Malignant neoplasm of unspecified part of unspecified bronchus or lung: Secondary | ICD-10-CM

## 2023-03-13 DIAGNOSIS — Z87891 Personal history of nicotine dependence: Secondary | ICD-10-CM | POA: Insufficient documentation

## 2023-03-13 DIAGNOSIS — I700 Atherosclerosis of aorta: Secondary | ICD-10-CM | POA: Diagnosis not present

## 2023-03-13 DIAGNOSIS — I739 Peripheral vascular disease, unspecified: Secondary | ICD-10-CM | POA: Insufficient documentation

## 2023-03-13 DIAGNOSIS — R4701 Aphasia: Secondary | ICD-10-CM | POA: Insufficient documentation

## 2023-03-13 DIAGNOSIS — F419 Anxiety disorder, unspecified: Secondary | ICD-10-CM | POA: Insufficient documentation

## 2023-03-13 DIAGNOSIS — K8020 Calculus of gallbladder without cholecystitis without obstruction: Secondary | ICD-10-CM | POA: Insufficient documentation

## 2023-03-13 DIAGNOSIS — E785 Hyperlipidemia, unspecified: Secondary | ICD-10-CM | POA: Insufficient documentation

## 2023-03-13 DIAGNOSIS — Z90710 Acquired absence of both cervix and uterus: Secondary | ICD-10-CM | POA: Insufficient documentation

## 2023-03-13 DIAGNOSIS — Z79899 Other long term (current) drug therapy: Secondary | ICD-10-CM | POA: Insufficient documentation

## 2023-03-13 DIAGNOSIS — Z888 Allergy status to other drugs, medicaments and biological substances status: Secondary | ICD-10-CM | POA: Insufficient documentation

## 2023-03-13 DIAGNOSIS — C3411 Malignant neoplasm of upper lobe, right bronchus or lung: Secondary | ICD-10-CM | POA: Diagnosis not present

## 2023-03-13 DIAGNOSIS — K219 Gastro-esophageal reflux disease without esophagitis: Secondary | ICD-10-CM | POA: Insufficient documentation

## 2023-03-13 DIAGNOSIS — Z8719 Personal history of other diseases of the digestive system: Secondary | ICD-10-CM | POA: Insufficient documentation

## 2023-03-13 DIAGNOSIS — C7931 Secondary malignant neoplasm of brain: Secondary | ICD-10-CM | POA: Insufficient documentation

## 2023-03-13 DIAGNOSIS — F32A Depression, unspecified: Secondary | ICD-10-CM | POA: Insufficient documentation

## 2023-03-13 LAB — CMP (CANCER CENTER ONLY)
ALT: 10 U/L (ref 0–44)
AST: 21 U/L (ref 15–41)
Albumin: 3.7 g/dL (ref 3.5–5.0)
Alkaline Phosphatase: 103 U/L (ref 38–126)
Anion gap: 7 (ref 5–15)
BUN: 11 mg/dL (ref 8–23)
CO2: 29 mmol/L (ref 22–32)
Calcium: 9.7 mg/dL (ref 8.9–10.3)
Chloride: 107 mmol/L (ref 98–111)
Creatinine: 0.79 mg/dL (ref 0.44–1.00)
GFR, Estimated: >60 mL/min (ref >60)
Glucose, Bld: 112 mg/dL — ABNORMAL HIGH (ref 70–99)
Potassium: 4 mmol/L (ref 3.5–5.1)
Sodium: 143 mmol/L (ref 135–145)
Total Bilirubin: 0.3 mg/dL (ref 0.3–1.2)
Total Protein: 6.6 g/dL (ref 6.5–8.1)

## 2023-03-13 LAB — CBC WITH DIFFERENTIAL (CANCER CENTER ONLY)
Abs Immature Granulocytes: 0 10*3/uL (ref 0.00–0.07)
Basophils Absolute: 0 10*3/uL (ref 0.0–0.1)
Basophils Relative: 1 %
Eosinophils Absolute: 0.1 10*3/uL (ref 0.0–0.5)
Eosinophils Relative: 2 %
HCT: 34.5 % — ABNORMAL LOW (ref 36.0–46.0)
Hemoglobin: 11 g/dL — ABNORMAL LOW (ref 12.0–15.0)
Immature Granulocytes: 0 %
Lymphocytes Relative: 43 %
Lymphs Abs: 1.8 10*3/uL (ref 0.7–4.0)
MCH: 27 pg (ref 26.0–34.0)
MCHC: 31.9 g/dL (ref 30.0–36.0)
MCV: 84.6 fL (ref 80.0–100.0)
Monocytes Absolute: 0.4 10*3/uL (ref 0.1–1.0)
Monocytes Relative: 10 %
Neutro Abs: 1.7 10*3/uL (ref 1.7–7.7)
Neutrophils Relative %: 44 %
Platelet Count: 198 10*3/uL (ref 150–400)
RBC: 4.08 MIL/uL (ref 3.87–5.11)
RDW: 12.3 % (ref 11.5–15.5)
WBC Count: 4 10*3/uL (ref 4.0–10.5)
nRBC: 0 % (ref 0.0–0.2)

## 2023-03-13 MED ORDER — SODIUM CHLORIDE (PF) 0.9 % IJ SOLN
INTRAMUSCULAR | Status: AC
  Filled 2023-03-13: qty 50

## 2023-03-13 MED ORDER — HEPARIN SOD (PORK) LOCK FLUSH 100 UNIT/ML IV SOLN
INTRAVENOUS | Status: AC
  Filled 2023-03-13: qty 5

## 2023-03-13 MED ORDER — HEPARIN SOD (PORK) LOCK FLUSH 100 UNIT/ML IV SOLN
500.0000 [IU] | Freq: Once | INTRAVENOUS | Status: AC
  Administered 2023-03-13: 500 [IU] via INTRAVENOUS

## 2023-03-13 MED ORDER — IOHEXOL 300 MG/ML  SOLN
100.0000 mL | Freq: Once | INTRAMUSCULAR | Status: AC | PRN
  Administered 2023-03-13: 100 mL via INTRAVENOUS

## 2023-03-15 ENCOUNTER — Encounter: Payer: Self-pay | Admitting: Internal Medicine

## 2023-03-15 NOTE — Patient Instructions (Addendum)
      Blood work was ordered.   The lab is on the first floor.    Medications changes include :   remeron decreased to 15 mg nightly      Return in about 6 months (around 09/21/2023) for Physical Exam.

## 2023-03-15 NOTE — Progress Notes (Signed)
Subjective:    Patient ID: Hannah [name], female    DOB: [birth year], 72 y.o.   MRN: [id number]     HPI Kiyara is here for follow up of her chronic medical problems.  Here with her sister in law.  She is currently on dexamethasone for swelling in her brain - it has helped and symptoms have improved, but she feels jittery.    She is sleeping ok.  Meds working well.    Medications and allergies reviewed with patient and updated if appropriate.  Current Outpatient Medications on File Prior to Visit  Medication Sig Dispense Refill   acetaminophen (TYLENOL) 325 MG tablet Take 325 mg by mouth every 6 (six) hours as needed for moderate pain.     aspirin 81 MG tablet Take 1 tablet (81 mg total) by mouth daily. Restart on 08/31/20 30 tablet    bisacodyl (DULCOLAX) 5 MG EC tablet Take 5 mg by mouth daily as needed for moderate constipation.     carboxymethylcellulose (REFRESH PLUS) 0.5 % SOLN Place 1-2 drops into both eyes 3 (three) times daily as needed (dry eyes).     Cholecalciferol (D3 PO) Take 1 capsule by mouth daily.     dexamethasone (DECADRON) 4 MG tablet Take 1 tablet (4 mg total) by mouth 2 (two) times daily with a meal. 60 tablet 1   fluticasone (FLONASE) 50 MCG/ACT nasal spray USE 2 SPRAYS IN EACH NOSTRIL EVERY DAY 48 g 3   folic acid (FOLVITE) 1 MG tablet Take 1 tablet by mouth once daily 30 tablet 0   ketoconazole (NIZORAL) 2 % cream APPLY 1 APPLICATION TOPICALLY DAILY 30 g 0   Lacosamide 100 MG TABS Take 1 tablet (100 mg total) by mouth 2 (two) times daily. 60 tablet 0   levETIRAcetam (KEPPRA) 100 MG/ML solution TAKE 15 ML BY MOUTH  TWICE DAILY 473 mL 0   lidocaine-prilocaine (EMLA) cream Apply 1 Application topically as needed. 30 g 2   linaclotide (LINZESS) 72 MCG capsule Take 1 capsule (72 mcg total) by mouth daily before breakfast. Take 30 minutes prior to breakfast 30 capsule 5   loratadine (CLARITIN) 10 MG tablet Take 10 mg by mouth daily as needed for allergies.      magnesium hydroxide (MILK OF MAGNESIA) 800 MG/5ML suspension Take 30 mLs by mouth daily as needed for constipation.     Menthol, Topical Analgesic, (BIOFREEZE EX) Apply 1 application topically as needed (pain).     olopatadine (PATANOL) 0.1 % ophthalmic solution Place 1 drop into both eyes 2 (two) times daily as needed for allergies.     polyethylene glycol (MIRALAX / GLYCOLAX) 17 g packet Take 17 g by mouth 2 (two) times daily. 72 each 0   pravastatin (PRAVACHOL) 40 MG tablet TAKE 1 TABLET EVERY EVENING 90 tablet 3   Probiotic Product (PROBIOTIC DAILY PO) Take 1 capsule by mouth daily.     prochlorperazine (COMPAZINE) 10 MG tablet TAKE 1 TABLET BY MOUTH EVERY 6 HOURS AS NEEDED FOR NAUSEA OR VOMITING 30 tablet 0   simethicone (MYLICON) 80 MG chewable tablet Chew 1 tablet (80 mg total) by mouth 4 (four) times daily as needed for flatulence (Bloating). 30 tablet 0   No current facility-administered medications on file prior to visit.     Review of Systems  Constitutional:  Negative for fever.       Feels jittery inside from steroids  Respiratory:  Positive for shortness of breath (sometimes). Negative for  cough and wheezing.   Cardiovascular:  Negative for chest pain, palpitations and leg swelling.  Gastrointestinal:  Positive for constipation. Negative for abdominal pain.       Gerd controlled  Neurological:  Positive for light-headedness and headaches.  Psychiatric/Behavioral:  Negative for sleep disturbance.        Objective:   Vitals:   03/24/23 0905  BP: 126/72  Pulse: 72  Temp: 98.2 F (36.8 C)  SpO2: 97%   BP Readings from Last 3 Encounters:  03/24/23 126/72  03/20/23 138/72  03/11/23 104/60   Wt Readings from Last 3 Encounters:  03/24/23 122 lb (55.3 kg)  03/20/23 127 lb 12.8 oz (58 kg)  03/11/23 126 lb 12.8 oz (57.5 kg)   Body mass index is 23.83 kg/m.    Physical Exam Constitutional:      General: She is not in acute distress.    Appearance: Normal  appearance.  HENT:     Head: Normocephalic and atraumatic.  Eyes:     Conjunctiva/sclera: Conjunctivae normal.  Cardiovascular:     Rate and Rhythm: Normal rate and regular rhythm.     Heart sounds: Normal heart sounds.  Pulmonary:     Effort: Pulmonary effort is normal. No respiratory distress.     Breath sounds: Normal breath sounds. No wheezing.  Musculoskeletal:     Cervical back: Neck supple.     Right lower leg: No edema.     Left lower leg: No edema.  Lymphadenopathy:     Cervical: No cervical adenopathy.  Skin:    General: Skin is warm and dry.     Findings: No rash.  Neurological:     Mental Status: She is alert. Mental status is at baseline.  Psychiatric:        Mood and Affect: Mood normal.        Behavior: Behavior normal.        Lab Results  Component Value Date   WBC 4.0 03/13/2023   HGB 11.0 (L) 03/13/2023   HCT 34.5 (L) 03/13/2023   PLT 198 03/13/2023   GLUCOSE 112 (H) 03/13/2023   CHOL 185 03/21/2022   TRIG 212.0 (H) 03/21/2022   HDL 52.30 03/21/2022   LDLDIRECT 89.0 03/21/2022   LDLCALC 62 03/14/2020   ALT 10 03/13/2023   AST 21 03/13/2023   NA 143 03/13/2023   K 4.0 03/13/2023   CL 107 03/13/2023   CREATININE 0.79 03/13/2023   BUN 11 03/13/2023   CO2 29 03/13/2023   TSH 0.64 03/06/2023   INR 1.0 02/12/2021   HGBA1C 6.1 03/06/2023   MICROALBUR <0.7 03/21/2022     Assessment & Plan:    See Problem List for Assessment and Plan of chronic medical problems.

## 2023-03-16 ENCOUNTER — Encounter: Payer: Self-pay | Admitting: Internal Medicine

## 2023-03-16 NOTE — Progress Notes (Signed)
Addendum: Reviewed and agree with assessment and management plan. Kadijah Shamoon M, MD  

## 2023-03-17 ENCOUNTER — Inpatient Hospital Stay: Payer: Medicare HMO | Admitting: Internal Medicine

## 2023-03-17 DIAGNOSIS — Z8719 Personal history of other diseases of the digestive system: Secondary | ICD-10-CM | POA: Diagnosis not present

## 2023-03-17 DIAGNOSIS — I739 Peripheral vascular disease, unspecified: Secondary | ICD-10-CM | POA: Diagnosis not present

## 2023-03-17 DIAGNOSIS — C3411 Malignant neoplasm of upper lobe, right bronchus or lung: Secondary | ICD-10-CM | POA: Diagnosis not present

## 2023-03-17 DIAGNOSIS — I700 Atherosclerosis of aorta: Secondary | ICD-10-CM | POA: Diagnosis not present

## 2023-03-17 DIAGNOSIS — F419 Anxiety disorder, unspecified: Secondary | ICD-10-CM | POA: Diagnosis not present

## 2023-03-17 DIAGNOSIS — F32A Depression, unspecified: Secondary | ICD-10-CM | POA: Diagnosis not present

## 2023-03-17 DIAGNOSIS — K219 Gastro-esophageal reflux disease without esophagitis: Secondary | ICD-10-CM | POA: Diagnosis not present

## 2023-03-17 DIAGNOSIS — R4701 Aphasia: Secondary | ICD-10-CM | POA: Diagnosis not present

## 2023-03-17 DIAGNOSIS — C3490 Malignant neoplasm of unspecified part of unspecified bronchus or lung: Secondary | ICD-10-CM

## 2023-03-17 DIAGNOSIS — K8020 Calculus of gallbladder without cholecystitis without obstruction: Secondary | ICD-10-CM | POA: Diagnosis not present

## 2023-03-17 DIAGNOSIS — Z87891 Personal history of nicotine dependence: Secondary | ICD-10-CM | POA: Diagnosis not present

## 2023-03-17 DIAGNOSIS — Z79899 Other long term (current) drug therapy: Secondary | ICD-10-CM | POA: Diagnosis not present

## 2023-03-17 DIAGNOSIS — C7931 Secondary malignant neoplasm of brain: Secondary | ICD-10-CM | POA: Diagnosis not present

## 2023-03-17 DIAGNOSIS — E785 Hyperlipidemia, unspecified: Secondary | ICD-10-CM | POA: Diagnosis not present

## 2023-03-17 DIAGNOSIS — Z888 Allergy status to other drugs, medicaments and biological substances status: Secondary | ICD-10-CM | POA: Diagnosis not present

## 2023-03-17 DIAGNOSIS — Z90710 Acquired absence of both cervix and uterus: Secondary | ICD-10-CM | POA: Diagnosis not present

## 2023-03-17 NOTE — Progress Notes (Signed)
[facility] [phone]   [phone]  PROGRESS NOTE FOR TELEMEDICINE VISITS  Hannah Sanes, MD [address] [place] [address]  I connected withNAME@ on 03/17/23 at  1:30 PM EDT by video enabled telemedicine visit and verified that I am speaking with the correct person using two identifiers.   I discussed the limitations, risks, security and privacy concerns of performing an evaluation and management service by telemedicine and the availability of in-person appointments. I also discussed with the patient that there may be a patient responsible charge related to this service. The patient expressed understanding and agreed to proceed.  Other persons participating in the visit and their role in the encounter: daughter  Patient's location: Home Provider's location: [facility]  DIAGNOSIS: Stage IV non-small cell lung cancer (T3, N0, M1C) adenocarcinoma.  The patient presented with a and solitary brain metastasis.  The patient was diagnosed in February 2022.   Biomarker Findings Microsatellite status - MS-Stable Tumor Mutational Burden - 8 Muts/Mb Genomic Findings For a complete list of the genes assayed, please refer to the Appendix. KRAS G12V KEAP1 S224F TP53 P151T 7 Disease relevant genes with no reportable alterations: ALK, BRAF, EGFR, ERBB2, MET, RET, ROS1   PDL1 Expression: 50%     PRIOR THERAPY: 1) SRS to the metastatic brain lesion on 08/23/2020 under the care of Dr. [name] and craniotomy under the care of Dr. Maurice Small scheduled for 08/24/2020. 2) S/p robotic assisted right upper lobectomy with en bloc wedge resection of the right middle lobe and lymph node dissection under the care of Dr. Dorris Fetch on September 24, 2020. 3) status post SRS to a new subcentimeter brain lesions under the care of Dr. Mitzi Hansen. 4) SRS to brain metastasis. 5) Systemic chemotherapy with carboplatin for AUC of 5, Alimta 500 Mg/M2 and Keytruda  200 mg IV every 3 weeks.  First dose Nov 07, 2020.  Status post 35 cycles.  Starting from cycle #5 the patient will be on maintenance treatment with Alimta and Keytruda every 3 weeks.   CURRENT THERAPY: Observation.  INTERVAL HISTORY: Hannah [name] 72 y.o. female has a MyChart video virtual visit with me today for evaluation and recommendation regarding her condition.  The patient is feeling fine today with no concerning complaints except for recent mild expressive aphasia and hard time finding the words.  She has MRI of the brain performed recently that showed some concerning finding for disease progression in the brain.  She is scheduled to see Dr. Barbaraann Cao in few days.  She resumed low-dose treatment with Decadron.  She denied having any current chest pain, shortness of breath, cough or hemoptysis.  She has no nausea, vomiting, diarrhea or constipation.  She has no headache or visual changes.  She has no recent weight loss or night sweats.  She had repeat CT scan of the chest, abdomen and pelvis performed recently and we are having the visit for discussion of her scan results.  MEDICAL HISTORY: Past Medical History:  Diagnosis Date   Allergy    Anemia    Anxiety    Arthritis    Carpal tunnel syndrome    Constipation    senna C stool softeners help    Depression    Diverticulosis    Dyslipidemia    External hemorrhoids    GERD (gastroesophageal reflux disease)    Heart murmur    mild-moderate AR   Hiatal hernia    Hyperlipidemia    on meds  Hypertension    Internal hemorrhoids    lung ca with brain mets 07/2020   Osteoarthritis    Pre-diabetes    PVD (peripheral vascular disease) (HCC)    moderate carotid disease   RBBB    Smoker    Vocal cord polyps     ALLERGIES:  is allergic to amlodipine, chantix [varenicline tartrate], clarithromycin, lisinopril, simvastatin, wellbutrin [bupropion hcl], lipitor [atorvastatin], and sertraline.  MEDICATIONS:  Current Outpatient  Medications  Medication Sig Dispense Refill   acetaminophen (TYLENOL) 325 MG tablet Take 325 mg by mouth every 6 (six) hours as needed for moderate pain.     ALPRAZolam (XANAX XR) 0.5 MG 24 hr tablet TAKE 1 TABLET BY MOUTH IN THE MORNING 30 tablet 0   ALPRAZolam (XANAX) 0.5 MG tablet TAKE 1 TABLET BY MOUTH THREE TIMES DAILY AS NEEDED FOR ANXIETY 90 tablet 0   aspirin 81 MG tablet Take 1 tablet (81 mg total) by mouth daily. Restart on 08/31/20 30 tablet    bisacodyl (DULCOLAX) 5 MG EC tablet Take 5 mg by mouth daily as needed for moderate constipation.     carboxymethylcellulose (REFRESH PLUS) 0.5 % SOLN Place 1-2 drops into both eyes 3 (three) times daily as needed (dry eyes).     Cholecalciferol (D3 PO) Take 1 capsule by mouth daily.     citalopram (CELEXA) 10 MG tablet Take 1 tablet by mouth once daily 90 tablet 0   fluticasone (FLONASE) 50 MCG/ACT nasal spray USE 2 SPRAYS IN EACH NOSTRIL EVERY DAY 48 g 3   folic acid (FOLVITE) 1 MG tablet Take 1 tablet by mouth once daily 30 tablet 0   ketoconazole (NIZORAL) 2 % cream APPLY 1 APPLICATION TOPICALLY DAILY 30 g 0   Lacosamide 100 MG TABS Take 1 tablet by mouth twice daily 60 tablet 0   levETIRAcetam (KEPPRA) 100 MG/ML solution TAKE 15 ML BY MOUTH  TWICE DAILY 473 mL 0   lidocaine-prilocaine (EMLA) cream Apply 1 Application topically as needed. 30 g 2   linaclotide (LINZESS) 72 MCG capsule Take 1 capsule (72 mcg total) by mouth daily before breakfast. Take 30 minutes prior to breakfast 30 capsule 5   loratadine (CLARITIN) 10 MG tablet Take 10 mg by mouth daily as needed for allergies.     magnesium hydroxide (MILK OF MAGNESIA) 800 MG/5ML suspension Take 30 mLs by mouth daily as needed for constipation.     Menthol, Topical Analgesic, (BIOFREEZE EX) Apply 1 application topically as needed (pain).     mirtazapine (REMERON) 30 MG tablet Take 30 mg by mouth at bedtime.     olopatadine (PATANOL) 0.1 % ophthalmic solution Place 1 drop into both eyes 2  (two) times daily as needed for allergies.     omeprazole (PRILOSEC) 40 MG capsule Take 1 capsule (40 mg total) by mouth daily. 90 capsule 1   polyethylene glycol (MIRALAX / GLYCOLAX) 17 g packet Take 17 g by mouth 2 (two) times daily. 72 each 0   pravastatin (PRAVACHOL) 40 MG tablet TAKE 1 TABLET EVERY EVENING 90 tablet 3   Probiotic Product (PROBIOTIC DAILY PO) Take 1 capsule by mouth daily.     prochlorperazine (COMPAZINE) 10 MG tablet TAKE 1 TABLET BY MOUTH EVERY 6 HOURS AS NEEDED FOR NAUSEA OR VOMITING 30 tablet 0   simethicone (MYLICON) 80 MG chewable tablet Chew 1 tablet (80 mg total) by mouth 4 (four) times daily as needed for flatulence (Bloating). 30 tablet 0   No current facility-administered medications  for this visit.    SURGICAL HISTORY:  Past Surgical History:  Procedure Laterality Date   ABDOMINAL HYSTERECTOMY  1994   APPLICATION OF CRANIAL NAVIGATION N/A 08/24/2020   Procedure: APPLICATION OF CRANIAL NAVIGATION;  Surgeon: Jadene Pierini, MD;  Location: [facility] OR;  Service: Neurosurgery;  Laterality: N/A;   COLONOSCOPY     COLONOSCOPY W/ POLYPECTOMY  2009   CRANIOTOMY Left 08/24/2020   Procedure: Left Craniotomy for Tumor Resection with Brainlab;  Surgeon: Jadene Pierini, MD;  Location: [facility] OR;  Service: Neurosurgery;  Laterality: Left;   HEMORRHOID SURGERY     INTERCOSTAL NERVE BLOCK Right 09/24/2020   Procedure: INTERCOSTAL NERVE BLOCK;  Surgeon: Loreli Slot, MD;  Location: [facility] OR;  Service: Thoracic;  Laterality: Right;   IR IMAGING GUIDED PORT INSERTION  05/15/2022   JOINT REPLACEMENT     LYMPH NODE DISSECTION Right 09/24/2020   Procedure: LYMPH NODE DISSECTION;  Surgeon: Loreli Slot, MD;  Location: [facility] OR;  Service: Thoracic;  Laterality: Right;   POLYPECTOMY     right total knee arthroplasty     Dr. Sherle Poe 06-04-18   SPINE SURGERY  08/16/2009   TOTAL KNEE ARTHROPLASTY Left 02/13/2014   Procedure: TOTAL KNEE ARTHROPLASTY;  Surgeon: Harvie Junior,  MD;  Location: [facility] OR;  Service: Orthopedics;  Laterality: Left;   TOTAL KNEE ARTHROPLASTY Right 06/04/2018   Procedure: RIGHT TOTAL KNEE ARTHROPLASTY;  Surgeon: Jodi Geralds, MD;  Location: [facility] ORS;  Service: Orthopedics;  Laterality: Right;  Adductor Block   TUBAL LIGATION      REVIEW OF SYSTEMS:  Constitutional: positive for fatigue Eyes: negative Ears, nose, mouth, throat, and face: negative Respiratory: negative Cardiovascular: negative Gastrointestinal: negative Genitourinary:negative Integument/breast: negative Hematologic/lymphatic: negative Musculoskeletal:negative Neurological: positive for speech problems Behavioral/Psych: negative Endocrine: negative Allergic/Immunologic: negative     LABORATORY DATA: Lab Results  Component Value Date   WBC 4.0 03/13/2023   HGB 11.0 (L) 03/13/2023   HCT 34.5 (L) 03/13/2023   MCV 84.6 03/13/2023   PLT 198 03/13/2023      Chemistry      Component Value Date/Time   NA 143 03/13/2023 1005   K 4.0 03/13/2023 1005   CL 107 03/13/2023 1005   CO2 29 03/13/2023 1005   BUN 11 03/13/2023 1005   CREATININE 0.79 03/13/2023 1005   CREATININE 0.79 03/14/2020 1016      Component Value Date/Time   CALCIUM 9.7 03/13/2023 1005   ALKPHOS 103 03/13/2023 1005   AST 21 03/13/2023 1005   ALT 10 03/13/2023 1005   BILITOT 0.3 03/13/2023 1005       RADIOGRAPHIC STUDIES: CT Chest W Contrast  Result Date: 03/16/2023 CLINICAL DATA:  Non-small cell lung cancer of the right upper lobe post chemoradiation; * Tracking Code: BO * EXAM: CT CHEST, ABDOMEN, AND PELVIS WITH CONTRAST TECHNIQUE: Multidetector CT imaging of the chest, abdomen and pelvis was performed following the standard protocol during bolus administration of intravenous contrast. RADIATION DOSE REDUCTION: This exam was performed according to the departmental dose-optimization program which includes automated exposure control, adjustment of the mA and/or kV according to patient size and/or  use of iterative reconstruction technique. CONTRAST:  OMNIPAQUE IOHEXOL 300 MG/ML  SOLN COMPARISON:  CT chest, abdomen and pelvis dated December 16, 2022 FINDINGS: CT CHEST FINDINGS Cardiovascular: Normal heart size. No pericardial effusion. Normal caliber thoracic aorta with moderate to severe atherosclerotic disease. Mild coronary artery calcifications. Right chest wall port with tip in the right atrium. Mediastinum/Nodes: Esophagus  and thyroid are unremarkable. No enlarged lymph nodes seen in the chest. Lungs/Pleura: Stable postsurgical findings of right upper lobectomy. Stable postsurgical findings of prior right middle lobe wedge resection. Stable solid pulmonary nodule of the left lower lobe measuring 3 mm on series 507, image 77. No new or enlarging pulmonary nodules. Stable mild right pleural thickening. No pleural effusion. Musculoskeletal: No aggressive appearing osseous lesions. CT ABDOMEN PELVIS FINDINGS Hepatobiliary: No focal liver abnormality is seen. Gallstones with no evidence of gallbladder wall thickening. No biliary ductal dilation. Pancreas: Unremarkable. No pancreatic ductal dilatation or surrounding inflammatory changes. Spleen: Normal in size without focal abnormality. Adrenals/Urinary Tract: Stable left adrenal gland nodule measuring 10 mm on series 504, image 58. normal appearance of the right adrenal gland. No hydronephrosis or nephrolithiasis. Bladder is mildly decompressed but appears mildly thick-walled, similar to prior exam. Stomach/Bowel: Stomach is within normal limits. Appendix appears normal. Diverticulosis. no evidence of bowel wall thickening, distention, or inflammatory changes. Vascular/Lymphatic: Severe atherosclerotic disease of the abdominal aorta. No enlarged lymph nodes seen in the abdomen or pelvis. Reproductive: Status post hysterectomy. No adnexal masses. Other: No abdominopelvic ascites.  No intra-abdominal free air. Musculoskeletal: Orthopedic hardware of the lower  lumbar spine. No aggressive appearing osseous lesions. IMPRESSION: 1. Stable postsurgical findings of right upper lobectomy and right middle lobe wedge resection. No evidence of local recurrence or metastatic disease. 2. Stable small solid pulmonary nodule of the left lower lobe. 3. Stable left adrenal gland nodule. 4. Aortic Atherosclerosis (ICD10-I70.0). Electronically Signed   By: Allegra Lai M.D.   On: 03/16/2023 10:37   CT Abdomen Pelvis W Contrast  Result Date: 03/16/2023 CLINICAL DATA:  Non-small cell lung cancer of the right upper lobe post chemoradiation; * Tracking Code: BO * EXAM: CT CHEST, ABDOMEN, AND PELVIS WITH CONTRAST TECHNIQUE: Multidetector CT imaging of the chest, abdomen and pelvis was performed following the standard protocol during bolus administration of intravenous contrast. RADIATION DOSE REDUCTION: This exam was performed according to the departmental dose-optimization program which includes automated exposure control, adjustment of the mA and/or kV according to patient size and/or use of iterative reconstruction technique. CONTRAST:  OMNIPAQUE IOHEXOL 300 MG/ML  SOLN COMPARISON:  CT chest, abdomen and pelvis dated December 16, 2022 FINDINGS: CT CHEST FINDINGS Cardiovascular: Normal heart size. No pericardial effusion. Normal caliber thoracic aorta with moderate to severe atherosclerotic disease. Mild coronary artery calcifications. Right chest wall port with tip in the right atrium. Mediastinum/Nodes: Esophagus and thyroid are unremarkable. No enlarged lymph nodes seen in the chest. Lungs/Pleura: Stable postsurgical findings of right upper lobectomy. Stable postsurgical findings of prior right middle lobe wedge resection. Stable solid pulmonary nodule of the left lower lobe measuring 3 mm on series 507, image 77. No new or enlarging pulmonary nodules. Stable mild right pleural thickening. No pleural effusion. Musculoskeletal: No aggressive appearing osseous lesions. CT ABDOMEN  PELVIS FINDINGS Hepatobiliary: No focal liver abnormality is seen. Gallstones with no evidence of gallbladder wall thickening. No biliary ductal dilation. Pancreas: Unremarkable. No pancreatic ductal dilatation or surrounding inflammatory changes. Spleen: Normal in size without focal abnormality. Adrenals/Urinary Tract: Stable left adrenal gland nodule measuring 10 mm on series 504, image 58. normal appearance of the right adrenal gland. No hydronephrosis or nephrolithiasis. Bladder is mildly decompressed but appears mildly thick-walled, similar to prior exam. Stomach/Bowel: Stomach is within normal limits. Appendix appears normal. Diverticulosis. no evidence of bowel wall thickening, distention, or inflammatory changes. Vascular/Lymphatic: Severe atherosclerotic disease of the abdominal aorta. No enlarged lymph nodes  seen in the abdomen or pelvis. Reproductive: Status post hysterectomy. No adnexal masses. Other: No abdominopelvic ascites.  No intra-abdominal free air. Musculoskeletal: Orthopedic hardware of the lower lumbar spine. No aggressive appearing osseous lesions. IMPRESSION: 1. Stable postsurgical findings of right upper lobectomy and right middle lobe wedge resection. No evidence of local recurrence or metastatic disease. 2. Stable small solid pulmonary nodule of the left lower lobe. 3. Stable left adrenal gland nodule. 4. Aortic Atherosclerosis (ICD10-I70.0). Electronically Signed   By: Allegra Lai M.D.   On: 03/16/2023 10:37   MR BRAIN W WO CONTRAST  Result Date: 03/16/2023 CLINICAL DATA:  Brain/CNS neoplasm, assess treatment response EXAM: MRI HEAD WITHOUT AND WITH CONTRAST TECHNIQUE: Multiplanar, multiecho pulse sequences of the brain and surrounding structures were obtained without and with intravenous contrast. CONTRAST:  5mL GADAVIST GADOBUTROL 1 MMOL/ML IV SOLN COMPARISON:  MRI Nov 20, 2022. FINDINGS: Brain: New focus of subcentimeter enhancement in the posterior right temporal lobe  (series 16 code 80), slightly more posterior and inferior to the focus of enhancement detailed below. Enhancement along the posterior right temporal lobe is mildly increased in size, now measuring 9 mm on 17, image 12, previously 7 mm when remeasured similarly. Increased extent of irregular peripheral enhancement in the left occipital lobe which extends from the prior skull defect to the ependymal margin of the left occipital horn (measuring approximately 4.9 x 2.4 cm on series 16, image 83). Surrounding edema is progressed. Left superior frontal gyrus patchy enhancement remains stable. No evidence of acute infarct, acute hemorrhage, or hydrocephalus. Vascular: Major arterial flow voids are maintained at the skull base. Skull and upper cervical spine: Normal marrow signal. Sinuses/Orbits: Mostly clear sinuses.  No acute orbital findings. IMPRESSION: 1. Interval progression of 4.9 cm irregular peripheral enhancement the left occipital lobe with extension to the occipital horn, detailed above and suspicious for progression. Increased surrounding edema without substantial mass effect. 2. Mild increase in size of posterior right temporal lobe enhancement, now 9 mm. New additional subcentimeter focus of enhancement slightly more inferiorly and posteriorly in the adjacent temporal lobe. 3. Similar left superior frontal gyrus patchy enhancement. Electronically Signed   By: Feliberto Harts M.D.   On: 03/16/2023 10:31    ASSESSMENT AND PLAN: This is a very pleasant 72 years old African-American female diagnosed with stage IV (T3, N0, M1b) non-small cell lung cancer, adenocarcinoma presented with right upper lobe lung mass with solitary brain metastasis diagnosed in February 2022 status post SRS to the brain lesion followed by craniotomy and resection. The patient also has a solitary lung mass. S/p robotic assisted right upper lobectomy with en bloc wedge resection of the right middle lobe and lymph node dissection  under the care of Dr. Dorris Fetch on September 24, 2020. She also underwent SRS treatment to a new subcentimeter brain lesions under the care of Dr. Mitzi Hansen She underwent systemic chemotherapy with carboplatin for AUC of 5, Alimta 500 Mg/M2 and Keytruda 200 Mg IV every 3 weeks status post 35 cycles.  Starting from cycle #5 the patient will be on maintenance treatment with Alimta and Keytruda every 3 weeks.   The patient tolerated her previous treatment fairly well with no concerning adverse effect except for fatigue.  She completed 2 years of treatment with Keytruda. The patient is currently on observation and has been doing fine with no concerning complaints except for the recent expressive aphasia. She had repeat CT scan of the chest, abdomen and pelvis performed recently.  I personally and  independently reviewed the scan and discussed the result with the patient and her daughter today. Her scan showed no concerning findings for disease progression. I recommended for her to continue on observation with repeat CT scan of the chest, abdomen and pelvis in 3 months. Regarding the expressive aphasia, her recent MRI of the brain showed evidence for disease progression in the brain.  The patient is scheduled to see Dr. Barbaraann Cao in few days for recommendation regarding her condition. She is currently off treatment with immunotherapy and it will be fine if she need higher dose of steroid for management of her vasogenic edema. The patient was advised to call immediately if she has any other concerning symptoms in the interval. I discussed the assessment and treatment plan with the patient. The patient was provided an opportunity to ask questions and all were answered. The patient agreed with the plan and demonstrated an understanding of the instructions.   The patient was advised to call back or seek an in-person evaluation if the symptoms worsen or if the condition fails to improve as anticipated.  Hannah Matte, MD 03/17/2023 2:05 PM  Disclaimer: This note was dictated with voice recognition software. Similar sounding words can inadvertently be transcribed and may not be corrected upon review.

## 2023-03-19 ENCOUNTER — Telehealth: Payer: Medicare HMO | Admitting: Internal Medicine

## 2023-03-19 ENCOUNTER — Inpatient Hospital Stay (HOSPITAL_BASED_OUTPATIENT_CLINIC_OR_DEPARTMENT_OTHER): Payer: Medicare HMO | Admitting: Internal Medicine

## 2023-03-19 ENCOUNTER — Other Ambulatory Visit: Payer: Self-pay | Admitting: Internal Medicine

## 2023-03-19 DIAGNOSIS — C3490 Malignant neoplasm of unspecified part of unspecified bronchus or lung: Secondary | ICD-10-CM | POA: Diagnosis not present

## 2023-03-19 DIAGNOSIS — D496 Neoplasm of unspecified behavior of brain: Secondary | ICD-10-CM

## 2023-03-19 DIAGNOSIS — C7931 Secondary malignant neoplasm of brain: Secondary | ICD-10-CM

## 2023-03-19 MED ORDER — LACOSAMIDE 100 MG PO TABS
1.0000 | ORAL_TABLET | Freq: Two times a day (BID) | ORAL | 0 refills | Status: DC
Start: 2023-03-19 — End: 2023-05-18

## 2023-03-19 MED ORDER — DEXAMETHASONE 4 MG PO TABS: 4.0000 mg | ORAL_TABLET | Freq: Two times a day (BID) | ORAL | 1 refills | Status: DC

## 2023-03-19 NOTE — Progress Notes (Signed)
Cardiology Office Note:   Date:  03/20/2023  ID:  Hannah [name], DOB [birth year], MRN [id number] PCP: Pincus Sanes, MD  [facility] Providers Cardiologist:  Rollene Rotunda, MD {  History of Present Illness:   Hannah [name] is a 71 y.o. female who presents for who presents for follow up of  PVD and a murmur.  She had mild sclerosis on her aortic valve with aortic insufficiency.     She had lung cancer stage IV non-small cell.  She had metastasis to the brain.  She had resection and craniotomy.  She had robot-assisted right upper lobectomy.  She has been treated with carboplatinum, Alimta and Keytruda.  She had an echo with moderate AI.  She underwent LITT to left parietal lesion on 10/14/21 at [facility]   She saw Dr. Chestine Spore.  ABI on the right was 1.08 on the left 0.87.  Lower extremity duplex demonstrated 50 to 74% stenosis of the left SFA and 75 to 99% stenosis in the mid popliteal.  She discussed this with Dr. Allyson Sabal in Nov 2023 but wanted to follow this with a conservative approach.    Since I last saw her she has followed with her oncologist and her neuro oncologist at [facility].  The patient denies any new symptoms such as chest discomfort, neck or arm discomfort. There has been no new shortness of breath, PND or orthopnea. There have been no reported palpitations, presyncope or syncope.   ROS: As stated in the HPI and negative for all other systems.  Studies Reviewed:    EKG:   EKG Interpretation Date/Time:  Friday March 20 2023 09:19:13 EDT Ventricular Rate:  65 PR Interval:  164 QRS Duration:  142 QT Interval:  462 QTC Calculation: 480 R Axis:   -65  Text Interpretation: Normal sinus rhythm Right bundle branch block Left anterior fascicular block Bifascicular block When compared with ECG of 29-Jul-2021 09:33, No significant change since last tracing Confirmed by Rollene Rotunda ([id number]) on 03/20/2023 9:41:11 AM     Risk Assessment/Calculations:              Physical  Exam:   VS:  BP 138/72 (BP Location: Left Arm, Patient Position: Sitting, Cuff Size: Normal)   Pulse 65   Ht 5' (1.524 m)   Wt 127 lb 12.8 oz (58 kg)   SpO2 99%   BMI 24.96 kg/m    Wt Readings from Last 3 Encounters:  03/20/23 127 lb 12.8 oz (58 kg)  03/11/23 126 lb 12.8 oz (57.5 kg)  03/06/23 128 lb 9.6 oz (58.3 kg)     GEN: Well nourished, well developed in no acute distress NECK: No JVD; bilateral carotid carotid bruits CARDIAC: RRR, 2 out of 6 apical systolic murmur heard at the apex slightly at aortic outflow tract murmurs, rubs, gallops RESPIRATORY:  Clear to auscultation without rales, wheezing or rhonchi  ABDOMEN: Soft, non-tender, non-distended, positive abdominal bruit EXTREMITIES:  No edema; No deformity, diminished dorsalis pedis and posterior tibialis, left femoral  ASSESSMENT AND PLAN:   AS/AI:  This is mild to moderate on echo in March 2024.  I will follow this clinically.  No change in therapy.   RBBB/LAFB:   She has no symptoms related to this.  She would let me know if she has any presyncope or syncope.  CAROTID STENOSIS:   She has 50 - 69% on the left in Sept and mild on the right in Sept 2023.  I will order follow up.  DYSLIPIDEMIA:     LDL was 62 previously.  She is going to get a lipid profile when she sees her primary provider.   DM: Her hemoglobin A1c was 6.1.  No change in therapy.   TOBACCO:  She is still not smoking.   HYPERTENSION:  Her BP was stable.  No change in therapy.   CLAUDICATION:     She is going to manage this medically.       Follow up with me in one year.   Signed, Rollene Rotunda, MD

## 2023-03-19 NOTE — Progress Notes (Signed)
I connected with [name] on 03/19/23 at  3:00 PM EDT by telephone visit and verified that I am speaking with the correct person using two identifiers.  I discussed the limitations, risks, security and privacy concerns of performing an evaluation and management service by telemedicine and the availability of in-person appointments. I also discussed with the patient that there may be a patient responsible charge related to this service. The patient expressed understanding and agreed to proceed.   Other persons participating in the visit and their role in the encounter:  Daughter, [name]   Patient's location:  Home Provider's location:  Actor Complaint:  Primary malignant neoplasm of lung with metastasis to brain [facility])  History of Present Ilness: Elle N Gellatly reports "slowing down" in day to day functioning, slower to get dressed.  She is having increased difficulty with writing as well.  Short term memory remains a concern.      Observations: Language and cognition at baseline  Imaging:  [facility] Clinician Interpretation: I have personally reviewed the CNS images as listed.  My interpretation, in the context of the patient's clinical presentation, is treatment effect vs true progression  CT Chest W Contrast  Result Date: 03/16/2023 CLINICAL DATA:  Non-small cell lung cancer of the right upper lobe post chemoradiation; * Tracking Code: BO * EXAM: CT CHEST, ABDOMEN, AND PELVIS WITH CONTRAST TECHNIQUE: Multidetector CT imaging of the chest, abdomen and pelvis was performed following the standard protocol during bolus administration of intravenous contrast. RADIATION DOSE REDUCTION: This exam was performed according to the departmental dose-optimization program which includes automated exposure control, adjustment of the mA and/or kV according to patient size and/or use of iterative reconstruction technique. CONTRAST:  OMNIPAQUE IOHEXOL 300 MG/ML  SOLN COMPARISON:  CT chest,  abdomen and pelvis dated December 16, 2022 FINDINGS: CT CHEST FINDINGS Cardiovascular: Normal heart size. No pericardial effusion. Normal caliber thoracic aorta with moderate to severe atherosclerotic disease. Mild coronary artery calcifications. Right chest wall port with tip in the right atrium. Mediastinum/Nodes: Esophagus and thyroid are unremarkable. No enlarged lymph nodes seen in the chest. Lungs/Pleura: Stable postsurgical findings of right upper lobectomy. Stable postsurgical findings of prior right middle lobe wedge resection. Stable solid pulmonary nodule of the left lower lobe measuring 3 mm on series 507, image 77. No new or enlarging pulmonary nodules. Stable mild right pleural thickening. No pleural effusion. Musculoskeletal: No aggressive appearing osseous lesions. CT ABDOMEN PELVIS FINDINGS Hepatobiliary: No focal liver abnormality is seen. Gallstones with no evidence of gallbladder wall thickening. No biliary ductal dilation. Pancreas: Unremarkable. No pancreatic ductal dilatation or surrounding inflammatory changes. Spleen: Normal in size without focal abnormality. Adrenals/Urinary Tract: Stable left adrenal gland nodule measuring 10 mm on series 504, image 58. normal appearance of the right adrenal gland. No hydronephrosis or nephrolithiasis. Bladder is mildly decompressed but appears mildly thick-walled, similar to prior exam. Stomach/Bowel: Stomach is within normal limits. Appendix appears normal. Diverticulosis. no evidence of bowel wall thickening, distention, or inflammatory changes. Vascular/Lymphatic: Severe atherosclerotic disease of the abdominal aorta. No enlarged lymph nodes seen in the abdomen or pelvis. Reproductive: Status post hysterectomy. No adnexal masses. Other: No abdominopelvic ascites.  No intra-abdominal free air. Musculoskeletal: Orthopedic hardware of the lower lumbar spine. No aggressive appearing osseous lesions. IMPRESSION: 1. Stable postsurgical findings of right upper  lobectomy and right middle lobe wedge resection. No evidence of local recurrence or metastatic disease. 2. Stable small solid pulmonary nodule of the left lower lobe. 3. Stable  left adrenal gland nodule. 4. Aortic Atherosclerosis (ICD10-I70.0). Electronically Signed   By: Allegra Lai M.D.   On: 03/16/2023 10:37   CT Abdomen Pelvis W Contrast  Result Date: 03/16/2023 CLINICAL DATA:  Non-small cell lung cancer of the right upper lobe post chemoradiation; * Tracking Code: BO * EXAM: CT CHEST, ABDOMEN, AND PELVIS WITH CONTRAST TECHNIQUE: Multidetector CT imaging of the chest, abdomen and pelvis was performed following the standard protocol during bolus administration of intravenous contrast. RADIATION DOSE REDUCTION: This exam was performed according to the departmental dose-optimization program which includes automated exposure control, adjustment of the mA and/or kV according to patient size and/or use of iterative reconstruction technique. CONTRAST:  OMNIPAQUE IOHEXOL 300 MG/ML  SOLN COMPARISON:  CT chest, abdomen and pelvis dated December 16, 2022 FINDINGS: CT CHEST FINDINGS Cardiovascular: Normal heart size. No pericardial effusion. Normal caliber thoracic aorta with moderate to severe atherosclerotic disease. Mild coronary artery calcifications. Right chest wall port with tip in the right atrium. Mediastinum/Nodes: Esophagus and thyroid are unremarkable. No enlarged lymph nodes seen in the chest. Lungs/Pleura: Stable postsurgical findings of right upper lobectomy. Stable postsurgical findings of prior right middle lobe wedge resection. Stable solid pulmonary nodule of the left lower lobe measuring 3 mm on series 507, image 77. No new or enlarging pulmonary nodules. Stable mild right pleural thickening. No pleural effusion. Musculoskeletal: No aggressive appearing osseous lesions. CT ABDOMEN PELVIS FINDINGS Hepatobiliary: No focal liver abnormality is seen. Gallstones with no evidence of gallbladder wall  thickening. No biliary ductal dilation. Pancreas: Unremarkable. No pancreatic ductal dilatation or surrounding inflammatory changes. Spleen: Normal in size without focal abnormality. Adrenals/Urinary Tract: Stable left adrenal gland nodule measuring 10 mm on series 504, image 58. normal appearance of the right adrenal gland. No hydronephrosis or nephrolithiasis. Bladder is mildly decompressed but appears mildly thick-walled, similar to prior exam. Stomach/Bowel: Stomach is within normal limits. Appendix appears normal. Diverticulosis. no evidence of bowel wall thickening, distention, or inflammatory changes. Vascular/Lymphatic: Severe atherosclerotic disease of the abdominal aorta. No enlarged lymph nodes seen in the abdomen or pelvis. Reproductive: Status post hysterectomy. No adnexal masses. Other: No abdominopelvic ascites.  No intra-abdominal free air. Musculoskeletal: Orthopedic hardware of the lower lumbar spine. No aggressive appearing osseous lesions. IMPRESSION: 1. Stable postsurgical findings of right upper lobectomy and right middle lobe wedge resection. No evidence of local recurrence or metastatic disease. 2. Stable small solid pulmonary nodule of the left lower lobe. 3. Stable left adrenal gland nodule. 4. Aortic Atherosclerosis (ICD10-I70.0). Electronically Signed   By: Allegra Lai M.D.   On: 03/16/2023 10:37   MR BRAIN W WO CONTRAST  Result Date: 03/16/2023 CLINICAL DATA:  Brain/CNS neoplasm, assess treatment response EXAM: MRI HEAD WITHOUT AND WITH CONTRAST TECHNIQUE: Multiplanar, multiecho pulse sequences of the brain and surrounding structures were obtained without and with intravenous contrast. CONTRAST:  5mL GADAVIST GADOBUTROL 1 MMOL/ML IV SOLN COMPARISON:  MRI Nov 20, 2022. FINDINGS: Brain: New focus of subcentimeter enhancement in the posterior right temporal lobe (series 16 code 80), slightly more posterior and inferior to the focus of enhancement detailed below. Enhancement along  the posterior right temporal lobe is mildly increased in size, now measuring 9 mm on 17, image 12, previously 7 mm when remeasured similarly. Increased extent of irregular peripheral enhancement in the left occipital lobe which extends from the prior skull defect to the ependymal margin of the left occipital horn (measuring approximately 4.9 x 2.4 cm on series 16, image 83). Surrounding edema is  progressed. Left superior frontal gyrus patchy enhancement remains stable. No evidence of acute infarct, acute hemorrhage, or hydrocephalus. Vascular: Major arterial flow voids are maintained at the skull base. Skull and upper cervical spine: Normal marrow signal. Sinuses/Orbits: Mostly clear sinuses.  No acute orbital findings. IMPRESSION: 1. Interval progression of 4.9 cm irregular peripheral enhancement the left occipital lobe with extension to the occipital horn, detailed above and suspicious for progression. Increased surrounding edema without substantial mass effect. 2. Mild increase in size of posterior right temporal lobe enhancement, now 9 mm. New additional subcentimeter focus of enhancement slightly more inferiorly and posteriorly in the adjacent temporal lobe. 3. Similar left superior frontal gyrus patchy enhancement. Electronically Signed   By: Feliberto Harts M.D.   On: 03/16/2023 10:31     Assessment and Plan: Primary malignant neoplasm of lung with metastasis to brain [facility])  Clinically progressive, with increased cognitive and motor impairment, modest.  MRI brain demonstrates progression of heavily treated left parietal lesion.    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  Follow Up Instructions: Will call early next week to continue adjusting steroids.  Next MRI brain in ~1 month.  I discussed the assessment and treatment plan with the patient.  The patient was provided an opportunity to ask questions and all were answered.  The patient  agreed with the plan and demonstrated understanding of the instructions.    The patient was advised to call back or seek an in-person evaluation if the symptoms worsen or if the condition fails to improve as anticipated.    Henreitta Leber, MD   I provided 22 minutes of non face-to-face telephone visit time during this encounter, and > 50% was spent counseling as documented under my assessment & plan.

## 2023-03-20 ENCOUNTER — Ambulatory Visit: Payer: Medicare HMO | Attending: Cardiology | Admitting: Cardiology

## 2023-03-20 ENCOUNTER — Encounter: Payer: Self-pay | Admitting: Cardiology

## 2023-03-20 VITALS — BP 138/72 | HR 65 | Ht 60.0 in | Wt 127.8 lb

## 2023-03-20 DIAGNOSIS — I10 Essential (primary) hypertension: Secondary | ICD-10-CM

## 2023-03-20 DIAGNOSIS — I739 Peripheral vascular disease, unspecified: Secondary | ICD-10-CM

## 2023-03-20 DIAGNOSIS — I6523 Occlusion and stenosis of bilateral carotid arteries: Secondary | ICD-10-CM

## 2023-03-20 DIAGNOSIS — I351 Nonrheumatic aortic (valve) insufficiency: Secondary | ICD-10-CM | POA: Diagnosis not present

## 2023-03-20 NOTE — Patient Instructions (Signed)
Testing/Procedures:  Your physician has requested that you have a carotid duplex. This test is an ultrasound of the carotid arteries in your neck. It looks at blood flow through these arteries that supply the brain with blood. Allow one hour for this exam. There are no restrictions or special instructions. [facility]   Follow-Up: At [facility], you and your health needs are our priority.  As part of our continuing mission to provide you with exceptional heart care, we have created designated Provider Care Teams.  These Care Teams include your primary Cardiologist (physician) and Advanced Practice Providers (APPs -  Physician Assistants and Nurse Practitioners) who all work together to provide you with the care you need, when you need it.  We recommend signing up for the patient portal called "MyChart".  Sign up information is provided on this After Visit Summary.  MyChart is used to connect with patients for Virtual Visits (Telemedicine).  Patients are able to view lab/test results, encounter notes, upcoming appointments, etc.  Non-urgent messages can be sent to your provider as well.   To learn more about what you can do with MyChart, go to [email or web address].    Your next appointment:   12 month(s)  Provider:   Rollene Rotunda, MD

## 2023-03-23 ENCOUNTER — Encounter: Payer: Self-pay | Admitting: Internal Medicine

## 2023-03-24 ENCOUNTER — Encounter: Payer: Self-pay | Admitting: Internal Medicine

## 2023-03-24 ENCOUNTER — Ambulatory Visit: Payer: Medicare HMO | Admitting: Internal Medicine

## 2023-03-24 ENCOUNTER — Ambulatory Visit (INDEPENDENT_AMBULATORY_CARE_PROVIDER_SITE_OTHER): Payer: Medicare HMO | Admitting: Internal Medicine

## 2023-03-24 ENCOUNTER — Inpatient Hospital Stay (HOSPITAL_BASED_OUTPATIENT_CLINIC_OR_DEPARTMENT_OTHER): Payer: Medicare HMO | Admitting: Internal Medicine

## 2023-03-24 VITALS — BP 126/72 | HR 72 | Temp 98.2°F | Ht 60.0 in | Wt 122.0 lb

## 2023-03-24 DIAGNOSIS — F3289 Other specified depressive episodes: Secondary | ICD-10-CM

## 2023-03-24 DIAGNOSIS — E782 Mixed hyperlipidemia: Secondary | ICD-10-CM | POA: Diagnosis not present

## 2023-03-24 DIAGNOSIS — C7931 Secondary malignant neoplasm of brain: Secondary | ICD-10-CM | POA: Diagnosis not present

## 2023-03-24 DIAGNOSIS — C3490 Malignant neoplasm of unspecified part of unspecified bronchus or lung: Secondary | ICD-10-CM

## 2023-03-24 DIAGNOSIS — E119 Type 2 diabetes mellitus without complications: Secondary | ICD-10-CM

## 2023-03-24 DIAGNOSIS — E1169 Type 2 diabetes mellitus with other specified complication: Secondary | ICD-10-CM | POA: Diagnosis not present

## 2023-03-24 DIAGNOSIS — G479 Sleep disorder, unspecified: Secondary | ICD-10-CM

## 2023-03-24 DIAGNOSIS — K219 Gastro-esophageal reflux disease without esophagitis: Secondary | ICD-10-CM | POA: Diagnosis not present

## 2023-03-24 DIAGNOSIS — F419 Anxiety disorder, unspecified: Secondary | ICD-10-CM

## 2023-03-24 LAB — COMPREHENSIVE METABOLIC PANEL
ALT: 16 U/L (ref 0–35)
AST: 20 U/L (ref 0–37)
Albumin: 3.8 g/dL (ref 3.5–5.2)
Alkaline Phosphatase: 106 U/L (ref 39–117)
BUN: 18 mg/dL (ref 6–23)
CO2: 33 mEq/L — ABNORMAL HIGH (ref 19–32)
Calcium: 9.5 mg/dL (ref 8.4–10.5)
Chloride: 100 mEq/L (ref 96–112)
Creatinine, Ser: 0.78 mg/dL (ref 0.40–1.20)
GFR: 74.71 mL/min (ref >60.00)
Glucose, Bld: 107 mg/dL — ABNORMAL HIGH (ref 70–99)
Potassium: 3.9 mEq/L (ref 3.5–5.1)
Sodium: 139 mEq/L (ref 135–145)
Total Bilirubin: 0.2 mg/dL (ref 0.2–1.2)
Total Protein: 6.7 g/dL (ref 6.0–8.3)

## 2023-03-24 LAB — LIPID PANEL
Cholesterol: 196 mg/dL (ref 0–200)
HDL: 84 mg/dL (ref >39.00)
LDL Cholesterol: 75 mg/dL (ref 0–99)
NonHDL: 111.98
Total CHOL/HDL Ratio: 2
Triglycerides: 183 mg/dL — ABNORMAL HIGH (ref 0.0–149.0)
VLDL: 36.6 mg/dL (ref 0.0–40.0)

## 2023-03-24 LAB — MICROALBUMIN / CREATININE URINE RATIO
Creatinine,U: 78.2 mg/dL
Microalb Creat Ratio: 0.9 mg/g (ref 0.0–30.0)
Microalb, Ur: <0.7 mg/dL (ref 0.0–1.9)

## 2023-03-24 LAB — HEMOGLOBIN A1C: Hgb A1c MFr Bld: 6.2 % (ref 4.6–6.5)

## 2023-03-24 MED ORDER — ALPRAZOLAM 0.5 MG PO TABS: 0.5000 mg | ORAL_TABLET | Freq: Three times a day (TID) | ORAL | 5 refills | Status: DC | PRN

## 2023-03-24 MED ORDER — ALPRAZOLAM ER 0.5 MG PO TB24: 0.5000 mg | ORAL_TABLET | Freq: Every morning | ORAL | 5 refills | Status: DC

## 2023-03-24 MED ORDER — MIRTAZAPINE 15 MG PO TBDP: 15.0000 mg | ORAL_TABLET | Freq: Every day | ORAL | 1 refills | Status: DC

## 2023-03-24 MED ORDER — OMEPRAZOLE 40 MG PO CPDR: 40.0000 mg | DELAYED_RELEASE_CAPSULE | Freq: Every day | ORAL | 1 refills | Status: DC

## 2023-03-24 MED ORDER — DEXAMETHASONE 1 MG PO TABS: ORAL_TABLET | ORAL | 0 refills | Status: AC

## 2023-03-24 MED ORDER — CITALOPRAM HYDROBROMIDE 10 MG PO TABS: 10.0000 mg | ORAL_TABLET | Freq: Every day | ORAL | 1 refills | Status: DC

## 2023-03-24 NOTE — Assessment & Plan Note (Addendum)
Chronic With hyperlipidemia  Lab Results  Component Value Date   HGBA1C 6.1 03/06/2023   Sugars well controlled Check A1c, urine microalbumin today Continue lifestyle control Stressed diabetic diet

## 2023-03-24 NOTE — Progress Notes (Signed)
I connected with [name] on 03/24/23 at 11:00 AM EDT by telephone visit and verified that I am speaking with the correct person using two identifiers.  I discussed the limitations, risks, security and privacy concerns of performing an evaluation and management service by telemedicine and the availability of in-person appointments. I also discussed with the patient that there may be a patient responsible charge related to this service. The patient expressed understanding and agreed to proceed.   Other persons participating in the visit and their role in the encounter:  Daughter, Hannah [name]   Patient's location:  Home Provider's location:  Actor Complaint:  Primary malignant neoplasm of lung with metastasis to brain [facility])  History of Present Ilness: Hannah [name] reports improvement in day to day functioning, cognitive sharpness, and language since starting the decadron 4mg  twice per day.  She is jittery and agitated at times with the medication.  Short term memory remains a concern.      Observations: Language and cognition at baseline  Imaging:  [facility] Clinician Interpretation: I have personally reviewed the CNS images as listed.  My interpretation, in the context of the patient's clinical presentation, is treatment effect vs true progression  CT Chest W Contrast  Result Date: 03/16/2023 CLINICAL DATA:  Non-small cell lung cancer of the right upper lobe post chemoradiation; * Tracking Code: BO * EXAM: CT CHEST, ABDOMEN, AND PELVIS WITH CONTRAST TECHNIQUE: Multidetector CT imaging of the chest, abdomen and pelvis was performed following the standard protocol during bolus administration of intravenous contrast. RADIATION DOSE REDUCTION: This exam was performed according to the departmental dose-optimization program which includes automated exposure control, adjustment of the mA and/or kV according to patient size and/or use of iterative reconstruction technique. CONTRAST:   OMNIPAQUE IOHEXOL 300 MG/ML  SOLN COMPARISON:  CT chest, abdomen and pelvis dated December 16, 2022 FINDINGS: CT CHEST FINDINGS Cardiovascular: Normal heart size. No pericardial effusion. Normal caliber thoracic aorta with moderate to severe atherosclerotic disease. Mild coronary artery calcifications. Right chest wall port with tip in the right atrium. Mediastinum/Nodes: Esophagus and thyroid are unremarkable. No enlarged lymph nodes seen in the chest. Lungs/Pleura: Stable postsurgical findings of right upper lobectomy. Stable postsurgical findings of prior right middle lobe wedge resection. Stable solid pulmonary nodule of the left lower lobe measuring 3 mm on series 507, image 77. No new or enlarging pulmonary nodules. Stable mild right pleural thickening. No pleural effusion. Musculoskeletal: No aggressive appearing osseous lesions. CT ABDOMEN PELVIS FINDINGS Hepatobiliary: No focal liver abnormality is seen. Gallstones with no evidence of gallbladder wall thickening. No biliary ductal dilation. Pancreas: Unremarkable. No pancreatic ductal dilatation or surrounding inflammatory changes. Spleen: Normal in size without focal abnormality. Adrenals/Urinary Tract: Stable left adrenal gland nodule measuring 10 mm on series 504, image 58. normal appearance of the right adrenal gland. No hydronephrosis or nephrolithiasis. Bladder is mildly decompressed but appears mildly thick-walled, similar to prior exam. Stomach/Bowel: Stomach is within normal limits. Appendix appears normal. Diverticulosis. no evidence of bowel wall thickening, distention, or inflammatory changes. Vascular/Lymphatic: Severe atherosclerotic disease of the abdominal aorta. No enlarged lymph nodes seen in the abdomen or pelvis. Reproductive: Status post hysterectomy. No adnexal masses. Other: No abdominopelvic ascites.  No intra-abdominal free air. Musculoskeletal: Orthopedic hardware of the lower lumbar spine. No aggressive appearing osseous lesions.  IMPRESSION: 1. Stable postsurgical findings of right upper lobectomy and right middle lobe wedge resection. No evidence of local recurrence or metastatic disease. 2. Stable small solid pulmonary nodule  of the left lower lobe. 3. Stable left adrenal gland nodule. 4. Aortic Atherosclerosis (ICD10-I70.0). Electronically Signed   By: Allegra Lai M.D.   On: 03/16/2023 10:37   CT Abdomen Pelvis W Contrast  Result Date: 03/16/2023 CLINICAL DATA:  Non-small cell lung cancer of the right upper lobe post chemoradiation; * Tracking Code: BO * EXAM: CT CHEST, ABDOMEN, AND PELVIS WITH CONTRAST TECHNIQUE: Multidetector CT imaging of the chest, abdomen and pelvis was performed following the standard protocol during bolus administration of intravenous contrast. RADIATION DOSE REDUCTION: This exam was performed according to the departmental dose-optimization program which includes automated exposure control, adjustment of the mA and/or kV according to patient size and/or use of iterative reconstruction technique. CONTRAST:  OMNIPAQUE IOHEXOL 300 MG/ML  SOLN COMPARISON:  CT chest, abdomen and pelvis dated December 16, 2022 FINDINGS: CT CHEST FINDINGS Cardiovascular: Normal heart size. No pericardial effusion. Normal caliber thoracic aorta with moderate to severe atherosclerotic disease. Mild coronary artery calcifications. Right chest wall port with tip in the right atrium. Mediastinum/Nodes: Esophagus and thyroid are unremarkable. No enlarged lymph nodes seen in the chest. Lungs/Pleura: Stable postsurgical findings of right upper lobectomy. Stable postsurgical findings of prior right middle lobe wedge resection. Stable solid pulmonary nodule of the left lower lobe measuring 3 mm on series 507, image 77. No new or enlarging pulmonary nodules. Stable mild right pleural thickening. No pleural effusion. Musculoskeletal: No aggressive appearing osseous lesions. CT ABDOMEN PELVIS FINDINGS Hepatobiliary: No focal liver  abnormality is seen. Gallstones with no evidence of gallbladder wall thickening. No biliary ductal dilation. Pancreas: Unremarkable. No pancreatic ductal dilatation or surrounding inflammatory changes. Spleen: Normal in size without focal abnormality. Adrenals/Urinary Tract: Stable left adrenal gland nodule measuring 10 mm on series 504, image 58. normal appearance of the right adrenal gland. No hydronephrosis or nephrolithiasis. Bladder is mildly decompressed but appears mildly thick-walled, similar to prior exam. Stomach/Bowel: Stomach is within normal limits. Appendix appears normal. Diverticulosis. no evidence of bowel wall thickening, distention, or inflammatory changes. Vascular/Lymphatic: Severe atherosclerotic disease of the abdominal aorta. No enlarged lymph nodes seen in the abdomen or pelvis. Reproductive: Status post hysterectomy. No adnexal masses. Other: No abdominopelvic ascites.  No intra-abdominal free air. Musculoskeletal: Orthopedic hardware of the lower lumbar spine. No aggressive appearing osseous lesions. IMPRESSION: 1. Stable postsurgical findings of right upper lobectomy and right middle lobe wedge resection. No evidence of local recurrence or metastatic disease. 2. Stable small solid pulmonary nodule of the left lower lobe. 3. Stable left adrenal gland nodule. 4. Aortic Atherosclerosis (ICD10-I70.0). Electronically Signed   By: Allegra Lai M.D.   On: 03/16/2023 10:37   MR BRAIN W WO CONTRAST  Result Date: 03/16/2023 CLINICAL DATA:  Brain/CNS neoplasm, assess treatment response EXAM: MRI HEAD WITHOUT AND WITH CONTRAST TECHNIQUE: Multiplanar, multiecho pulse sequences of the brain and surrounding structures were obtained without and with intravenous contrast. CONTRAST:  5mL GADAVIST GADOBUTROL 1 MMOL/ML IV SOLN COMPARISON:  MRI Nov 20, 2022. FINDINGS: Brain: New focus of subcentimeter enhancement in the posterior right temporal lobe (series 16 code 80), slightly more posterior and  inferior to the focus of enhancement detailed below. Enhancement along the posterior right temporal lobe is mildly increased in size, now measuring 9 mm on 17, image 12, previously 7 mm when remeasured similarly. Increased extent of irregular peripheral enhancement in the left occipital lobe which extends from the prior skull defect to the ependymal margin of the left occipital horn (measuring approximately 4.9 x 2.4 cm on  series 16, image 83). Surrounding edema is progressed. Left superior frontal gyrus patchy enhancement remains stable. No evidence of acute infarct, acute hemorrhage, or hydrocephalus. Vascular: Major arterial flow voids are maintained at the skull base. Skull and upper cervical spine: Normal marrow signal. Sinuses/Orbits: Mostly clear sinuses.  No acute orbital findings. IMPRESSION: 1. Interval progression of 4.9 cm irregular peripheral enhancement the left occipital lobe with extension to the occipital horn, detailed above and suspicious for progression. Increased surrounding edema without substantial mass effect. 2. Mild increase in size of posterior right temporal lobe enhancement, now 9 mm. New additional subcentimeter focus of enhancement slightly more inferiorly and posteriorly in the adjacent temporal lobe. 3. Similar left superior frontal gyrus patchy enhancement. Electronically Signed   By: Feliberto Harts M.D.   On: 03/16/2023 10:31     Assessment and Plan: Primary malignant neoplasm of lung with metastasis to brain [facility])  Clinically improved today.  Recommended decreasing decadron to 4mg  daily, then by 1mg  each week until tapered off, if tolerated.  Follow Up Instructions: F/u in person after next MRI brain in ~1 month.  I discussed the assessment and treatment plan with the patient.  The patient was provided an opportunity to ask questions and all were answered.  The patient agreed with the plan and demonstrated understanding of the instructions.    The patient was advised  to call back or seek an in-person evaluation if the symptoms worsen or if the condition fails to improve as anticipated.    Henreitta Leber, MD   I provided 15 minutes of non face-to-face telephone visit time during this encounter, and > 50% was spent counseling as documented under my assessment & plan.

## 2023-03-24 NOTE — Assessment & Plan Note (Addendum)
Chronic Sleeping well most nights Continue taking Remeron 15 mg nightly

## 2023-03-24 NOTE — Assessment & Plan Note (Signed)
Chronic Regular exercise and healthy diet encouraged Check lipid panel  Continue pravastatin 40 mg daily

## 2023-03-24 NOTE — Assessment & Plan Note (Addendum)
Chronic Controlled, Stable Continue Celexa 10 mg daily, alprazolam xr 0.5 mg daily in am, alprazolam 0.5 mg daily as needed only, remeron 15 mg HS

## 2023-03-24 NOTE — Assessment & Plan Note (Addendum)
Chronic Controlled, Stable Continue Celexa 10 mg daily, mirtazapine 15 mg HS

## 2023-03-24 NOTE — Assessment & Plan Note (Signed)
Chronic GERD controlled Continue omeprazole 40 mg daily

## 2023-03-26 ENCOUNTER — Other Ambulatory Visit: Payer: Self-pay | Admitting: *Deleted

## 2023-03-26 ENCOUNTER — Other Ambulatory Visit (HOSPITAL_COMMUNITY): Payer: Medicare HMO

## 2023-03-26 DIAGNOSIS — E782 Mixed hyperlipidemia: Secondary | ICD-10-CM

## 2023-03-26 MED ORDER — PRAVASTATIN SODIUM 80 MG PO TABS: 80.0000 mg | ORAL_TABLET | Freq: Every evening | ORAL | 3 refills | Status: DC

## 2023-03-27 ENCOUNTER — Other Ambulatory Visit: Payer: Self-pay | Admitting: *Deleted

## 2023-03-27 DIAGNOSIS — E782 Mixed hyperlipidemia: Secondary | ICD-10-CM

## 2023-03-30 ENCOUNTER — Inpatient Hospital Stay: Payer: Medicare HMO

## 2023-03-31 ENCOUNTER — Ambulatory Visit: Payer: Medicare HMO | Admitting: Internal Medicine

## 2023-04-03 ENCOUNTER — Inpatient Hospital Stay (HOSPITAL_COMMUNITY): Admission: RE | Admit: 2023-04-03 | Payer: Medicare HMO | Source: Ambulatory Visit

## 2023-04-16 ENCOUNTER — Ambulatory Visit: Payer: Medicare HMO

## 2023-04-16 VITALS — Ht 60.0 in | Wt 127.0 lb

## 2023-04-16 DIAGNOSIS — Z0000 Encounter for general adult medical examination without abnormal findings: Secondary | ICD-10-CM | POA: Diagnosis not present

## 2023-04-16 NOTE — Progress Notes (Addendum)
Subjective:   Hannah [name] is a 72 y.o. female who presents for Medicare Annual (Subsequent) preventive examination.  Visit Complete: Virtual I connected with  Hannah [name] on 04/16/23 by a audio enabled telemedicine application and verified that I am speaking with the correct person using two identifiers.  Patient Location: Home  Provider Location: Office/Clinic  I discussed the limitations of evaluation and management by telemedicine. The patient expressed understanding and agreed to proceed.  Vital Signs: Because this visit was a virtual/telehealth visit, some criteria may be missing or patient reported. Any vitals not documented were not able to be obtained and vitals that have been documented are patient reported.  Patient Medicare AWV questionnaire was completed by the patient on 04/16/2023; I have confirmed that all information answered by patient is correct and no changes since this date.  Cardiac Risk Factors include: advanced age (>31men, >21 women);dyslipidemia;family history of premature cardiovascular disease;hypertension     Objective:    Today's Vitals   04/16/23 0846 04/16/23 0856  Weight: 127 lb (57.6 kg)   Height: 5' (1.524 m)   PainSc: 0-No pain 0-No pain   Body mass index is 24.8 kg/m.     04/16/2023    8:47 AM 12/18/2022    8:22 AM 11/27/2022   11:37 AM 11/06/2022   11:49 AM 10/16/2022   10:01 AM 07/28/2022    9:08 AM 07/03/2022    9:36 AM  Advanced Directives  Does Patient Have a Medical Advance Directive? Yes Yes Yes Yes Yes Yes Yes  Type of Estate agent of [place];Living will Healthcare Power of [place];Living will Healthcare Power of [place];Living will Healthcare Power of [place];Living will Healthcare Power of [place];Living will Healthcare Power of [place];Living will Healthcare Power of [place];Living will  Does patient want to make changes to medical advance directive? No - Patient declined    No - Patient declined   No - Patient declined  Copy of Healthcare Power of Attorney in Chart? Yes - validated most recent copy scanned in chart (See row information)    Yes - validated most recent copy scanned in chart (See row information)  Yes - validated most recent copy scanned in chart (See row information)  Would patient like information on creating a medical advance directive?  No - Patient declined  No - Patient declined No - Patient declined  No - Patient declined    Current Medications (verified) Outpatient Encounter Medications as of 04/16/2023  Medication Sig   acetaminophen (TYLENOL) 325 MG tablet Take 325 mg by mouth every 6 (six) hours as needed for moderate pain.   ALPRAZolam (XANAX XR) 0.5 MG 24 hr tablet Take 1 tablet (0.5 mg total) by mouth every morning.   ALPRAZolam (XANAX) 0.5 MG tablet Take 1 tablet (0.5 mg total) by mouth 3 (three) times daily as needed. for anxiety   aspirin 81 MG tablet Take 1 tablet (81 mg total) by mouth daily. Restart on 08/31/20   bisacodyl (DULCOLAX) 5 MG EC tablet Take 5 mg by mouth daily as needed for moderate constipation.   carboxymethylcellulose (REFRESH PLUS) 0.5 % SOLN Place 1-2 drops into both eyes 3 (three) times daily as needed (dry eyes).   Cholecalciferol (D3 PO) Take 1 capsule by mouth daily.   citalopram (CELEXA) 10 MG tablet Take 1 tablet (10 mg total) by mouth daily.   dexamethasone (DECADRON) 1 MG tablet Take 3 tablets (3 mg total) by mouth daily with breakfast for 7 days, THEN 2 tablets (  2 mg total) daily with breakfast for 7 days, THEN 1 tablet (1 mg total) daily with breakfast for 7 days.   fluticasone (FLONASE) 50 MCG/ACT nasal spray USE 2 SPRAYS IN EACH NOSTRIL EVERY DAY   folic acid (FOLVITE) 1 MG tablet Take 1 tablet by mouth once daily   ketoconazole (NIZORAL) 2 % cream APPLY 1 APPLICATION TOPICALLY DAILY   Lacosamide 100 MG TABS Take 1 tablet (100 mg total) by mouth 2 (two) times daily.   levETIRAcetam (KEPPRA) 100 MG/ML solution TAKE 15 ML BY  MOUTH  TWICE DAILY   lidocaine-prilocaine (EMLA) cream Apply 1 Application topically as needed.   linaclotide (LINZESS) 72 MCG capsule Take 1 capsule (72 mcg total) by mouth daily before breakfast. Take 30 minutes prior to breakfast   loratadine (CLARITIN) 10 MG tablet Take 10 mg by mouth daily as needed for allergies.   magnesium hydroxide (MILK OF MAGNESIA) 800 MG/5ML suspension Take 30 mLs by mouth daily as needed for constipation.   Menthol, Topical Analgesic, (BIOFREEZE EX) Apply 1 application topically as needed (pain).   mirtazapine (REMERON SOL-TAB) 15 MG disintegrating tablet Take 1 tablet (15 mg total) by mouth at bedtime.   olopatadine (PATANOL) 0.1 % ophthalmic solution Place 1 drop into both eyes 2 (two) times daily as needed for allergies.   omeprazole (PRILOSEC) 40 MG capsule Take 1 capsule (40 mg total) by mouth daily.   polyethylene glycol (MIRALAX / GLYCOLAX) 17 g packet Take 17 g by mouth 2 (two) times daily.   pravastatin (PRAVACHOL) 80 MG tablet Take 1 tablet (80 mg total) by mouth every evening.   Probiotic Product (PROBIOTIC DAILY PO) Take 1 capsule by mouth daily.   prochlorperazine (COMPAZINE) 10 MG tablet TAKE 1 TABLET BY MOUTH EVERY 6 HOURS AS NEEDED FOR NAUSEA OR VOMITING   simethicone (MYLICON) 80 MG chewable tablet Chew 1 tablet (80 mg total) by mouth 4 (four) times daily as needed for flatulence (Bloating).   No facility-administered encounter medications on file as of 04/16/2023.    Allergies (verified) Amlodipine, Chantix [varenicline tartrate], Clarithromycin, Lisinopril, Simvastatin, Wellbutrin [bupropion hcl], Lipitor [atorvastatin], and Sertraline   History: Past Medical History:  Diagnosis Date   Allergy    Anemia    Anxiety    Arthritis    Carpal tunnel syndrome    Constipation    senna C stool softeners help    Depression    Diverticulosis    Dyslipidemia    External hemorrhoids    GERD (gastroesophageal reflux disease)    Heart murmur     mild-moderate AR   Hiatal hernia    Hyperlipidemia    on meds    Hypertension    Internal hemorrhoids    lung ca with brain mets 07/2020   Osteoarthritis    Pre-diabetes    PVD (peripheral vascular disease) (HCC)    moderate carotid disease   RBBB    Smoker    Vocal cord polyps    Past Surgical History:  Procedure Laterality Date   ABDOMINAL HYSTERECTOMY  1994   APPLICATION OF CRANIAL NAVIGATION N/A 08/24/2020   Procedure: APPLICATION OF CRANIAL NAVIGATION;  Surgeon: Jadene Pierini, MD;  Location: [facility] OR;  Service: Neurosurgery;  Laterality: N/A;   COLONOSCOPY     COLONOSCOPY W/ POLYPECTOMY  2009   CRANIOTOMY Left 08/24/2020   Procedure: Left Craniotomy for Tumor Resection with Brainlab;  Surgeon: Jadene Pierini, MD;  Location[facility] OR;  Service: Neurosurgery;  Laterality: Left;  HEMORRHOID SURGERY     INTERCOSTAL NERVE BLOCK Right 09/24/2020   Procedure: INTERCOSTAL NERVE BLOCK;  Surgeon: Loreli Slot, MD;  Location: [facility] OR;  Service: Thoracic;  Laterality: Right;   IR IMAGING GUIDED PORT INSERTION  05/15/2022   JOINT REPLACEMENT     LYMPH NODE DISSECTION Right 09/24/2020   Procedure: LYMPH NODE DISSECTION;  Surgeon: Loreli Slot, MD;  Location: [facility] OR;  Service: Thoracic;  Laterality: Right;   POLYPECTOMY     right total knee arthroplasty     Dr. Sherle Poe 06-04-18   SPINE SURGERY  08/16/2009   TOTAL KNEE ARTHROPLASTY Left 02/13/2014   Procedure: TOTAL KNEE ARTHROPLASTY;  Surgeon: Harvie Junior, MD;  Location: [facility] OR;  Service: Orthopedics;  Laterality: Left;   TOTAL KNEE ARTHROPLASTY Right 06/04/2018   Procedure: RIGHT TOTAL KNEE ARTHROPLASTY;  Surgeon: Jodi Geralds, MD;  Location: [facility] ORS;  Service: Orthopedics;  Laterality: Right;  Adductor Block   TUBAL LIGATION     Family History  Problem Relation Age of Onset   Cancer Father    Prostate cancer Father    Diabetes Sister    Cancer Sister    Stroke Maternal Grandfather    Colitis Maternal Aunt     Breast cancer Maternal Aunt    Colon cancer Neg Hx    Colon polyps Neg Hx    Rectal cancer Neg Hx    Stomach cancer Neg Hx    Esophageal cancer Neg Hx    Social History   Socioeconomic History   Marital status: Married    Spouse name: Not on file   Number of children: 2   Years of education: Not on file   Highest education level: 12th grade  Occupational History   Not on file  Tobacco Use   Smoking status: Former    Current packs/day: 0.00    Average packs/day: 0.3 packs/day for 40.0 years (10.0 ttl pk-yrs)    Types: Cigarettes    Start date: 07/09/1980    Quit date: 07/09/2020    Years since quitting: 2.7    Passive exposure: Never   Smokeless tobacco: Never   Tobacco comments:    PACK WILL LAST 3 days  Vaping Use   Vaping status: Never Used  Substance and Sexual Activity   Alcohol use: No   Drug use: No   Sexual activity: Not Currently  Other Topics Concern   Not on file  Social History Narrative   Not on file   Social Determinants of Health   Financial Resource Strain: Low Risk  (04/16/2023)   Overall Financial Resource Strain (CARDIA)    Difficulty of Paying Living Expenses: Not hard at all  Food Insecurity: No Food Insecurity (04/16/2023)   Hunger Vital Sign    Worried About Running Out of Food in the Last Year: Never true    Ran Out of Food in the Last Year: Never true  Transportation Needs: No Transportation Needs (04/16/2023)   PRAPARE - Administrator, Civil Service (Medical): No    Lack of Transportation (Non-Medical): No  Physical Activity: Insufficiently Active (04/16/2023)   Exercise Vital Sign    Days of Exercise per Week: 3 days    Minutes of Exercise per Session: 30 min  Stress: No Stress Concern Present (04/16/2023)   [employer] of Occupational Health - Occupational Stress Questionnaire    Feeling of Stress : Not at all  Social Connections: Unknown (04/16/2023)   Social Connection and Isolation Panel [NHANES]  Frequency  of Communication with Friends and Family: More than three times a week    Frequency of Social Gatherings with Friends and Family: Twice a week    Attends Religious Services: Patient unable to answer    Active Member of Clubs or Organizations: Yes    Attends Engineer, structural: More than 4 times per year    Marital Status: Married    Tobacco Counseling Counseling given: Not Answered Tobacco comments: PACK WILL LAST 3 days   Clinical Intake:  Pre-visit preparation completed: Yes  Pain : No/denies pain Pain Score: 0-No pain     BMI - recorded: 24.8 Nutritional Status: BMI of 19-24  Normal Nutritional Risks: None Diabetes: No (PREDIABETIC)  How often do you need to have someone help you when you read instructions, pamphlets, or other written materials from your doctor or pharmacy?: 1 - Never What is the last grade level you completed in school?: HSG  Interpreter Needed?: No  Information entered by :: Ramondo Dietze N. Remmie Bembenek, LPN.   Activities of Daily Living    04/16/2023    8:52 AM 04/16/2023    7:30 AM  In your present state of health, do you have any difficulty performing the following activities:  Hearing? 0 0  Vision? 1 1  Difficulty concentrating or making decisions? 0 0  Walking or climbing stairs? 0 0  Dressing or bathing? 0 0  Doing errands, shopping? 1 1  Preparing Food and eating ? N N  Using the Toilet? N N  In the past six months, have you accidently leaked urine? N N  Do you have problems with loss of bowel control? N N  Managing your Medications? N N  Managing your Finances? N N  Housekeeping or managing your Housekeeping? N N    Patient Care Team: Pincus Sanes, MD as PCP - General (Internal Medicine) Rollene Rotunda, MD as PCP - Cardiology (Cardiology) Kathyrn Sheriff, [facility] (Inactive) as Pharmacist (Pharmacist) [facility] as Consulting Physician (Ophthalmology)  Indicate any recent Medical Services you may have  received from other than [facility] providers in the past year (date may be approximate).     Assessment:   This is a routine wellness examination for Brigitta.  Hearing/Vision screen Hearing Screening - Comments:: Denies hearing difficulties; no hearing aids.  Vision Screening - Comments:: Wear readers only - changing optometrist.    Goals Addressed             This Visit's Progress    My goal is to keep exercising.        Depression Screen    04/16/2023    8:49 AM 12/23/2022    2:16 PM 09/19/2022    9:34 AM 09/11/2022   11:19 AM 09/01/2022    3:11 PM 04/14/2022    8:55 AM 03/21/2022    9:21 AM  PHQ 2/9 Scores  PHQ - 2 Score 0 0 0 0 2 0 0  PHQ- 9 Score 0 0 1 3 3 1 1     Fall Risk    04/16/2023    8:53 AM 04/16/2023    7:30 AM 12/23/2022    2:16 PM 09/19/2022    9:33 AM 09/11/2022   11:19 AM  Fall Risk   Falls in the past year? 0 0 0 0 0  Number falls in past yr: 0 0 0 0 0  Injury with Fall? 0 0 0 0 0  Risk for fall due to : No Fall Risks  No Fall Risks  No Fall Risks  Follow up Falls prevention discussed  Falls evaluation completed Falls evaluation completed Falls evaluation completed    MEDICARE RISK AT HOME: Medicare Risk at Home Any stairs in or around the home?: Yes If so, are there any without handrails?: No Home free of loose throw rugs in walkways, pet beds, electrical cords, etc?: Yes Adequate lighting in your home to reduce risk of falls?: Yes Life alert?: No Use of a cane, walker or w/c?: No Grab bars in the bathroom?: Yes Shower chair or bench in shower?: No Elevated toilet seat or a handicapped toilet?: No  TIMED UP AND GO:  Was the test performed?  No    Cognitive Function: Normal cognitive status assessed by direct observation via telephone conversation by this Nurse Health Advisor. No abnormalities found.     04/16/2023    8:52 AM 03/06/2023    2:32 PM 12/21/2017    8:43 AM  MMSE - Mini Mental State Exam  Not completed: Unable to complete     Orientation to time  2 5  Orientation to Place  4 5  Registration  2 3  Attention/ Calculation  0 5  Recall  3 3  Language- name 2 objects  2 2  Language- repeat  1 1  Language- follow 3 step command  3 3  Language- read & follow direction  1 1  Write a sentence  1 1  Copy design  0 1  Total score  19 30        04/16/2023    8:52 AM 04/14/2022    9:08 AM  6CIT Screen  What Year? 0 points 0 points  What month? 0 points 0 points  What time? 0 points 0 points  Count back from 20 0 points 0 points  Months in reverse 0 points 4 points  Repeat phrase 0 points 2 points  Total Score 0 points 6 points    Immunizations Immunization History  Administered Date(s) Administered   Fluad Quad(high Dose 65+) 03/14/2019, 03/14/2020, 05/01/2022   Moderna SARS-COV2 Booster Vaccination 05/01/2020   Moderna Sars-Covid-2 Vaccination 08/22/2019, 09/20/2019, 06/26/2022   Pneumococcal Conjugate-13 05/30/2014   Pneumococcal Polysaccharide-23 02/27/2016   Td 08/01/2004   Tdap 05/30/2014    TDAP status: Up to date  Flu Vaccine status: Due, Education has been provided regarding the importance of this vaccine. Advised may receive this vaccine at local pharmacy or Health Dept. Aware to provide a copy of the vaccination record if obtained from local pharmacy or Health Dept. Verbalized acceptance and understanding.  Pneumococcal vaccine status: Up to date  Covid-19 vaccine status: Completed vaccines  Qualifies for Shingles Vaccine? Yes   Zostavax completed No   Shingrix Completed?: No.    Education has been provided regarding the importance of this vaccine. Patient has been advised to call insurance company to determine out of pocket expense if they have not yet received this vaccine. Advised may also receive vaccine at local pharmacy or Health Dept. Verbalized acceptance and understanding.  Screening Tests Health Maintenance  Topic Date Due   OPHTHALMOLOGY EXAM  05/30/2021   DEXA SCAN   09/02/2021   FOOT EXAM  03/20/2022   COVID-19 Vaccine (4 - 2023-24 season) 03/01/2023   INFLUENZA VACCINE  09/28/2023 (Originally 01/29/2023)   HEMOGLOBIN A1C  09/21/2023   Diabetic kidney evaluation - eGFR measurement  03/23/2024   Diabetic kidney evaluation - Urine ACR  03/23/2024   Medicare Annual Wellness (AWV)  04/15/2024  Colonoscopy  05/19/2024   DTaP/Tdap/Td (3 - Td or Tdap) 05/30/2024   MAMMOGRAM  06/06/2024   Pneumonia Vaccine 47+ Years old  Completed   Hepatitis C Screening  Completed   HPV VACCINES  Aged Out   Zoster Vaccines- Shingrix  Discontinued    Health Maintenance  Health Maintenance Due  Topic Date Due   OPHTHALMOLOGY EXAM  05/30/2021   DEXA SCAN  09/02/2021   FOOT EXAM  03/20/2022   COVID-19 Vaccine (4 - 2023-24 season) 03/01/2023    Colorectal cancer screening: Type of screening: Colonoscopy. Completed 05/20/2019. Repeat every 5 years  Mammogram status: Completed 06/06/2022. Repeat every year  Bone Density status: Completed 09/02/2016. Results reflect: Bone density results: NORMAL. Repeat every 5 years.  Lung Cancer Screening: (Low Dose CT Chest recommended if Age 38-80 years, 20 pack-year currently smoking OR have quit w/in 15years.) does not qualify.   Lung Cancer Screening Referral: NO  Additional Screening:  Hepatitis C Screening: does qualify; Completed 08/28/2016  Vision Screening: Recommended annual ophthalmology exams for early detection of glaucoma and other disorders of the eye. Is the patient up to date with their annual eye exam?  Yes  Who is the provider or what is the name of the office in which the patient attends annual eye exams? Last seen by [facility]; changing optometrist. If pt is not established with a provider, would they like to be referred to a provider to establish care? No .   Dental Screening: Recommended annual dental exams for proper oral hygiene  Diabetic Foot Exam: N/A  Community Resource Referral / Chronic Care  Management: CRR required this visit?  No   CCM required this visit?  No     Plan:     I have personally reviewed and noted the following in the patient's chart:   Medical and social history Use of alcohol, tobacco or illicit drugs  Current medications and supplements including opioid prescriptions. Patient is not currently taking opioid prescriptions. Functional ability and status Nutritional status Physical activity Advanced directives List of other physicians Hospitalizations, surgeries, and ER visits in previous 12 months Vitals Screenings to include cognitive, depression, and falls Referrals and appointments  In addition, I have reviewed and discussed with patient certain preventive protocols, quality metrics, and best practice recommendations. A written personalized care plan for preventive services as well as general preventive health recommendations were provided to patient.     Mickeal Needy, LPN   09/60/4540   After Visit Summary: (MyChart) Due to this being a telephonic visit, the after visit summary with patients personalized plan was offered to patient via MyChart   Nurse Notes: N/A

## 2023-04-16 NOTE — Patient Instructions (Addendum)
Hannah [name] , Thank you for taking time to come for your Medicare Wellness Visit. I appreciate your ongoing commitment to your health goals. Please review the following plan we discussed and let me know if I can assist you in the future.   Referrals/Orders/Follow-Ups/Clinician Recommendations: NO  This is a list of the screening recommended for you and due dates:  Health Maintenance  Topic Date Due   Eye exam for diabetics  05/30/2021   DEXA scan (bone density measurement)  09/02/2021   Complete foot exam   03/20/2022   COVID-19 Vaccine (4 - 2023-24 season) 03/01/2023   Flu Shot  09/28/2023*   Hemoglobin A1C  09/21/2023   Yearly kidney function blood test for diabetes  03/23/2024   Yearly kidney health urinalysis for diabetes  03/23/2024   Medicare Annual Wellness Visit  04/15/2024   Colon Cancer Screening  05/19/2024   DTaP/Tdap/Td vaccine (3 - Td or Tdap) 05/30/2024   Mammogram  06/06/2024   Pneumonia Vaccine  Completed   Hepatitis C Screening  Completed   HPV Vaccine  Aged Out   Zoster (Shingles) Vaccine  Discontinued  *Topic was postponed. The date shown is not the original due date.    Advanced directives: (In Chart) A copy of your advanced directives are scanned into your chart should your provider ever need it.  Next Medicare Annual Wellness Visit scheduled for next year: Yes

## 2023-04-17 ENCOUNTER — Other Ambulatory Visit: Payer: Self-pay | Admitting: Internal Medicine

## 2023-04-17 DIAGNOSIS — D496 Neoplasm of unspecified behavior of brain: Secondary | ICD-10-CM

## 2023-04-21 ENCOUNTER — Telehealth: Payer: Self-pay | Admitting: Internal Medicine

## 2023-04-21 ENCOUNTER — Ambulatory Visit
Admission: RE | Admit: 2023-04-21 | Discharge: 2023-04-21 | Disposition: A | Discharge status: Home / Self Care | Payer: Medicare HMO | Source: Ambulatory Visit | Attending: Internal Medicine

## 2023-04-21 DIAGNOSIS — C3490 Malignant neoplasm of unspecified part of unspecified bronchus or lung: Secondary | ICD-10-CM

## 2023-04-21 DIAGNOSIS — C7931 Secondary malignant neoplasm of brain: Secondary | ICD-10-CM | POA: Diagnosis not present

## 2023-04-21 MED ORDER — SODIUM CHLORIDE 0.9% FLUSH
10.0000 mL | INTRAVENOUS | Status: DC | PRN
  Administered 2023-04-21: 10 mL via INTRAVENOUS

## 2023-04-21 MED ORDER — HEPARIN SOD (PORK) LOCK FLUSH 100 UNIT/ML IV SOLN
500.0000 [IU] | Freq: Once | INTRAVENOUS | Status: AC
  Administered 2023-04-21: 500 [IU] via INTRAVENOUS

## 2023-04-21 MED ORDER — GADOPICLENOL 0.5 MMOL/ML IV SOLN
6.0000 mL | Freq: Once | INTRAVENOUS | Status: AC | PRN
  Administered 2023-04-21: 6 mL via INTRAVENOUS

## 2023-04-22 ENCOUNTER — Encounter: Payer: Self-pay | Admitting: Internal Medicine

## 2023-04-22 ENCOUNTER — Other Ambulatory Visit: Payer: Self-pay | Admitting: *Deleted

## 2023-04-22 ENCOUNTER — Telehealth: Payer: Self-pay | Admitting: *Deleted

## 2023-04-22 MED ORDER — DEXAMETHASONE 2 MG PO TABS: 2.0000 mg | ORAL_TABLET | Freq: Every day | ORAL | 0 refills | Status: DC

## 2023-04-22 NOTE — Telephone Encounter (Signed)
[name] left message saying Ms Hannah [name] completed her decadron 1 mg on Monday. She now has a "very bad headache", has taken Tylenol with some relief. States she has been doing well until today. Is wondering if the headache is from stopping the steroids, do they need to restart?

## 2023-04-23 ENCOUNTER — Inpatient Hospital Stay: Payer: Medicare HMO

## 2023-04-27 ENCOUNTER — Inpatient Hospital Stay: Payer: Medicare HMO

## 2023-04-28 ENCOUNTER — Inpatient Hospital Stay: Payer: Medicare HMO | Attending: Internal Medicine | Admitting: Internal Medicine

## 2023-04-28 ENCOUNTER — Inpatient Hospital Stay: Payer: Medicare HMO

## 2023-04-28 VITALS — BP 120/67 | HR 72 | Temp 98.2°F | Resp 16 | Ht 60.0 in | Wt 134.6 lb

## 2023-04-28 DIAGNOSIS — K219 Gastro-esophageal reflux disease without esophagitis: Secondary | ICD-10-CM | POA: Insufficient documentation

## 2023-04-28 DIAGNOSIS — C3490 Malignant neoplasm of unspecified part of unspecified bronchus or lung: Secondary | ICD-10-CM

## 2023-04-28 DIAGNOSIS — Z8719 Personal history of other diseases of the digestive system: Secondary | ICD-10-CM | POA: Insufficient documentation

## 2023-04-28 DIAGNOSIS — Z803 Family history of malignant neoplasm of breast: Secondary | ICD-10-CM | POA: Diagnosis not present

## 2023-04-28 DIAGNOSIS — E785 Hyperlipidemia, unspecified: Secondary | ICD-10-CM | POA: Insufficient documentation

## 2023-04-28 DIAGNOSIS — Z79899 Other long term (current) drug therapy: Secondary | ICD-10-CM | POA: Insufficient documentation

## 2023-04-28 DIAGNOSIS — F419 Anxiety disorder, unspecified: Secondary | ICD-10-CM | POA: Diagnosis not present

## 2023-04-28 DIAGNOSIS — Z8042 Family history of malignant neoplasm of prostate: Secondary | ICD-10-CM | POA: Diagnosis not present

## 2023-04-28 DIAGNOSIS — Z87891 Personal history of nicotine dependence: Secondary | ICD-10-CM | POA: Insufficient documentation

## 2023-04-28 DIAGNOSIS — F32A Depression, unspecified: Secondary | ICD-10-CM | POA: Diagnosis not present

## 2023-04-28 DIAGNOSIS — Z7952 Long term (current) use of systemic steroids: Secondary | ICD-10-CM | POA: Insufficient documentation

## 2023-04-28 DIAGNOSIS — Z823 Family history of stroke: Secondary | ICD-10-CM | POA: Diagnosis not present

## 2023-04-28 DIAGNOSIS — C7931 Secondary malignant neoplasm of brain: Secondary | ICD-10-CM

## 2023-04-28 DIAGNOSIS — Z8379 Family history of other diseases of the digestive system: Secondary | ICD-10-CM | POA: Diagnosis not present

## 2023-04-28 DIAGNOSIS — Z833 Family history of diabetes mellitus: Secondary | ICD-10-CM | POA: Insufficient documentation

## 2023-04-28 DIAGNOSIS — Z90710 Acquired absence of both cervix and uterus: Secondary | ICD-10-CM | POA: Insufficient documentation

## 2023-04-28 DIAGNOSIS — R519 Headache, unspecified: Secondary | ICD-10-CM | POA: Diagnosis not present

## 2023-04-28 DIAGNOSIS — Z888 Allergy status to other drugs, medicaments and biological substances status: Secondary | ICD-10-CM | POA: Insufficient documentation

## 2023-04-28 DIAGNOSIS — Z809 Family history of malignant neoplasm, unspecified: Secondary | ICD-10-CM | POA: Diagnosis not present

## 2023-04-28 DIAGNOSIS — C3411 Malignant neoplasm of upper lobe, right bronchus or lung: Secondary | ICD-10-CM | POA: Insufficient documentation

## 2023-04-28 NOTE — Progress Notes (Signed)
[facility] at [facility] [address]  [place], [place] [address] [phone]   Interval Evaluation  Date of Service: 04/28/23 Patient Name: Hannah [name] Patient MRN: [id number] Patient DOB: 10-30-1950 Provider: Henreitta Leber, MD  Identifying Statement:  Hannah [name] is a 72 y.o. female with brain metastasis   Primary Cancer:  Oncology History  Primary cancer of right upper lobe of lung (HCC)  08/07/2020 Initial Diagnosis   Primary cancer of right upper lobe of lung (HCC)   09/20/2020 Cancer Staging   Staging form: Lung, AJCC 8th Edition - Clinical: Stage IVB (cT3, cN0, cM1c) - Signed by Si Gaul, MD on 09/20/2020   11/08/2020 - 01/16/2022 Chemotherapy   Patient is on Treatment Plan : LUNG CARBOplatin / Pemetrexed / Pembrolizumab q21d Induction x 4 cycles / Maintenance Pemetrexed + Pembrolizumab     11/16/2020 - 11/27/2022 Chemotherapy   Patient is on Treatment Plan : LUNG Carboplatin (5) + Pemetrexed (500) + Pembrolizumab (200) D1 q21d Induction x 4 cycles / Maintenance Pemetrexed (500) + Pembrolizumab (200) D1 q21d      Oncologic History 08/23/20: Pre-op SRS to L frontal mass Hannah [name]) 08/24/20: Craniotomy, resection (Ostergard) 11/29/20: Salvage SRS x2 Hannah [name]) 10/14/21: L frontoparietal progression, undergoes LITT at [facility]) 11/18/21: Path is adenocarcinoma, completes 25/5 SRS Hannah [name])  Interval History:  Hannah [name] presents today for follow up after recent MRI brain.  No new or progressive changes today.  Decadron was increased to 3mg  daily because of a few recent headaches.  She continues on keppra 500mg  BID and vimpat without further seizures.  Continues on observation with Dr. Arbutus Ped.  H+P (08/02/20) Patient presented to medical attention this past week with several days rapidly progressive speech impairment and right sided weakness.  She and her family describe difficulty putting complete sentences together, impaired  use of right arm (for using fork, zipper, brushing teeth), and dragging of the right leg while walking.  She never needed assistance to walk.  CNS imaging demonstrated left frontal mass, and decadron was started over the weekend 4mg  twice per day.  She feels improved with regards to the right sided weakness with the steroids.  Otherwise no seizures, headaches.  Medications: Current Outpatient Medications on File Prior to Visit  Medication Sig Dispense Refill   acetaminophen (TYLENOL) 325 MG tablet Take 325 mg by mouth every 6 (six) hours as needed for moderate pain.     ALPRAZolam (XANAX XR) 0.5 MG 24 hr tablet Take 1 tablet (0.5 mg total) by mouth every morning. 30 tablet 5   ALPRAZolam (XANAX) 0.5 MG tablet Take 1 tablet (0.5 mg total) by mouth 3 (three) times daily as needed. for anxiety 90 tablet 5   aspirin 81 MG tablet Take 1 tablet (81 mg total) by mouth daily. Restart on 08/31/20 30 tablet    bisacodyl (DULCOLAX) 5 MG EC tablet Take 5 mg by mouth daily as needed for moderate constipation.     carboxymethylcellulose (REFRESH PLUS) 0.5 % SOLN Place 1-2 drops into both eyes 3 (three) times daily as needed (dry eyes).     Cholecalciferol (D3 PO) Take 1 capsule by mouth daily.     citalopram (CELEXA) 10 MG tablet Take 1 tablet (10 mg total) by mouth daily. 90 tablet 1   dexamethasone (DECADRON) 2 MG tablet Take 1 tablet (2 mg total) by mouth daily. 30 tablet 0   fluticasone (FLONASE) 50 MCG/ACT nasal spray USE 2 SPRAYS IN EACH NOSTRIL EVERY  DAY 48 g 3   folic acid (FOLVITE) 1 MG tablet Take 1 tablet by mouth once daily 30 tablet 0   ketoconazole (NIZORAL) 2 % cream APPLY 1 APPLICATION TOPICALLY DAILY 30 g 0   Lacosamide 100 MG TABS Take 1 tablet (100 mg total) by mouth 2 (two) times daily. 60 tablet 0   levETIRAcetam (KEPPRA) 100 MG/ML solution TAKE 15 ML BY MOUTH  TWICE DAILY 473 mL 0   lidocaine-prilocaine (EMLA) cream Apply 1 Application topically as needed. 30 g 2   linaclotide (LINZESS) 72  MCG capsule Take 1 capsule (72 mcg total) by mouth daily before breakfast. Take 30 minutes prior to breakfast (Patient not taking: Reported on 04/28/2023) 30 capsule 5   loratadine (CLARITIN) 10 MG tablet Take 10 mg by mouth daily as needed for allergies.     magnesium hydroxide (MILK OF MAGNESIA) 800 MG/5ML suspension Take 30 mLs by mouth daily as needed for constipation.     Menthol, Topical Analgesic, (BIOFREEZE EX) Apply 1 application topically as needed (pain).     mirtazapine (REMERON SOL-TAB) 15 MG disintegrating tablet Take 1 tablet (15 mg total) by mouth at bedtime. 90 tablet 1   olopatadine (PATANOL) 0.1 % ophthalmic solution Place 1 drop into both eyes 2 (two) times daily as needed for allergies.     omeprazole (PRILOSEC) 40 MG capsule Take 1 capsule (40 mg total) by mouth daily. 90 capsule 1   polyethylene glycol (MIRALAX / GLYCOLAX) 17 g packet Take 17 g by mouth 2 (two) times daily. 72 each 0   pravastatin (PRAVACHOL) 80 MG tablet Take 1 tablet (80 mg total) by mouth every evening. 90 tablet 3   Probiotic Product (PROBIOTIC DAILY PO) Take 1 capsule by mouth daily.     prochlorperazine (COMPAZINE) 10 MG tablet TAKE 1 TABLET BY MOUTH EVERY 6 HOURS AS NEEDED FOR NAUSEA OR VOMITING 30 tablet 0   simethicone (MYLICON) 80 MG chewable tablet Chew 1 tablet (80 mg total) by mouth 4 (four) times daily as needed for flatulence (Bloating). 30 tablet 0   No current facility-administered medications on file prior to visit.    Allergies:  Allergies  Allergen Reactions   Amlodipine Swelling and Rash    Rash, swelling   Chantix [Varenicline Tartrate] Shortness Of Breath, Swelling and Other (See Comments)    Tongue swell,sob   Clarithromycin Rash   Lisinopril Hives   Simvastatin Hives   Wellbutrin [Bupropion Hcl] Hives   Lipitor [Atorvastatin]     Dizziness per patient   Sertraline     Makes her feel like she is going to kill someone   Past Medical History:  Past Medical History:   Diagnosis Date   Allergy    Anemia    Anxiety    Arthritis    Carpal tunnel syndrome    Constipation    senna C stool softeners help    Depression    Diverticulosis    Dyslipidemia    External hemorrhoids    GERD (gastroesophageal reflux disease)    Heart murmur    mild-moderate AR   Hiatal hernia    Hyperlipidemia    on meds    Hypertension    Internal hemorrhoids    lung ca with brain mets 07/2020   Osteoarthritis    Pre-diabetes    PVD (peripheral vascular disease) (HCC)    moderate carotid disease   RBBB    Smoker    Vocal cord polyps    Past Surgical  History:  Past Surgical History:  Procedure Laterality Date   ABDOMINAL HYSTERECTOMY  1994   APPLICATION OF CRANIAL NAVIGATION N/A 08/24/2020   Procedure: APPLICATION OF CRANIAL NAVIGATION;  Surgeon: Jadene Pierini, MD;  Location: [facility] OR;  Service: Neurosurgery;  Laterality: N/A;   COLONOSCOPY     COLONOSCOPY W/ POLYPECTOMY  2009   CRANIOTOMY Left 08/24/2020   Procedure: Left Craniotomy for Tumor Resection with Brainlab;  Surgeon: Jadene Pierini, MD;  Location: [facility] OR;  Service: Neurosurgery;  Laterality: Left;   HEMORRHOID SURGERY     INTERCOSTAL NERVE BLOCK Right 09/24/2020   Procedure: INTERCOSTAL NERVE BLOCK;  Surgeon: Loreli Slot, MD;  Location: [facility] OR;  Service: Thoracic;  Laterality: Right;   IR IMAGING GUIDED PORT INSERTION  05/15/2022   JOINT REPLACEMENT     LYMPH NODE DISSECTION Right 09/24/2020   Procedure: LYMPH NODE DISSECTION;  Surgeon: Loreli Slot, MD;  Location: [facility] OR;  Service: Thoracic;  Laterality: Right;   POLYPECTOMY     right total knee arthroplasty     Dr. Sherle Poe 06-04-18   SPINE SURGERY  08/16/2009   TOTAL KNEE ARTHROPLASTY Left 02/13/2014   Procedure: TOTAL KNEE ARTHROPLASTY;  Surgeon: Harvie Junior, MD;  Location: [facility] OR;  Service: Orthopedics;  Laterality: Left;   TOTAL KNEE ARTHROPLASTY Right 06/04/2018   Procedure: RIGHT TOTAL KNEE ARTHROPLASTY;  Surgeon: Jodi Geralds, MD;  Location: [facility] ORS;  Service: Orthopedics;  Laterality: Right;  Adductor Block   TUBAL LIGATION     Social History:  Social History   Socioeconomic History   Marital status: Married    Spouse name: Not on file   Number of children: 2   Years of education: Not on file   Highest education level: 12th grade  Occupational History   Not on file  Tobacco Use   Smoking status: Former    Current packs/day: 0.00    Average packs/day: 0.3 packs/day for 40.0 years (10.0 ttl pk-yrs)    Types: Cigarettes    Start date: 07/09/1980    Quit date: 07/09/2020    Years since quitting: 2.8    Passive exposure: Never   Smokeless tobacco: Never   Tobacco comments:    PACK WILL LAST 3 days  Vaping Use   Vaping status: Never Used  Substance and Sexual Activity   Alcohol use: No   Drug use: No   Sexual activity: Not Currently  Other Topics Concern   Not on file  Social History Narrative   Not on file   Social Determinants of Health   Financial Resource Strain: Low Risk  (04/16/2023)   Overall Financial Resource Strain (CARDIA)    Difficulty of Paying Living Expenses: Not hard at all  Food Insecurity: No Food Insecurity (04/16/2023)   Hunger Vital Sign    Worried About [name] Out of Food in the Last Year: Never true    Ran Out of Food in the Last Year: Never true  Transportation Needs: No Transportation Needs (04/16/2023)   PRAPARE - Administrator, Civil Service (Medical): No    Lack of Transportation (Non-Medical): No  Physical Activity: Insufficiently Active (04/16/2023)   Exercise Vital Sign    Days of Exercise per Week: 3 days    Minutes of Exercise per Session: 30 min  Stress: No Stress Concern Present (04/16/2023)   [employer] of Occupational Health - Occupational Stress Questionnaire    Feeling of Stress : Not at all  Social Connections: Unknown (  04/16/2023)   Social Connection and Isolation Panel [NHANES]    Frequency of Communication with Friends  and Family: More than three times a week    Frequency of Social Gatherings with Friends and Family: Twice a week    Attends Religious Services: Patient unable to answer    Active Member of Clubs or Organizations: Yes    Attends Banker Meetings: More than 4 times per year    Marital Status: Married  Catering manager Violence: Not At Risk (04/16/2023)   Humiliation, Afraid, Rape, and Kick questionnaire    Fear of Current or Ex-Partner: No    Emotionally Abused: No    Physically Abused: No    Sexually Abused: No   Family History:  Family History  Problem Relation Age of Onset   Cancer Father    Prostate cancer Father    Diabetes Sister    Cancer Sister    Stroke Maternal Grandfather    Colitis Maternal Aunt    Breast cancer Maternal Aunt    Colon cancer Neg Hx    Colon polyps Neg Hx    Rectal cancer Neg Hx    Stomach cancer Neg Hx    Esophageal cancer Neg Hx     Review of Systems: Constitutional: Doesn't report fevers, chills or abnormal weight loss Eyes: Doesn't report blurriness of vision Ears, nose, mouth, throat, and face: Doesn't report sore throat Respiratory: Doesn't report cough, dyspnea or wheezes Cardiovascular: Doesn't report palpitation, chest discomfort  Gastrointestinal:  Doesn't report nausea, constipation, diarrhea GU: Doesn't report incontinence Skin: Doesn't report skin rashes Neurological: Per HPI Musculoskeletal: Doesn't report joint pain Behavioral/Psych: ++anxiety  Physical Exam: Wt Readings from Last 3 Encounters:  04/28/23 134 lb 9.6 oz (61.1 kg)  04/16/23 127 lb (57.6 kg)  03/24/23 122 lb (55.3 kg)   Temp Readings from Last 3 Encounters:  04/28/23 98.2 F (36.8 C) (Oral)  03/24/23 98.2 F (36.8 C) (Oral)  03/06/23 (!) 97.4 F (36.3 C) (Temporal)   BP Readings from Last 3 Encounters:  04/28/23 120/67  03/24/23 126/72  03/20/23 138/72   Pulse Readings from Last 3 Encounters:  04/28/23 72  03/24/23 72  03/20/23 65      KPS: 80. General: Alert, cooperative, pleasant, in no acute distress Head: Normal EENT: No conjunctival injection or scleral icterus.  Lungs: Resp effort normal Cardiac: Regular rate Abdomen: Non-distended abdomen Skin: No rashes cyanosis or petechiae. Extremities: No clubbing or edema  Neurologic Exam: Mental Status: Awake, alert, attentive to examiner. Oriented to self and environment. Language is intact with regards to fluency, comprehension.  Psychomotor slowing, impaired recall. Cranial Nerves: Visual acuity is grossly normal. Visual fields are full. Extra-ocular movements intact. No ptosis. Face is symmetric Motor: Tone and bulk are normal. Power is 5/5 throughout. Reflexes are symmetric, no pathologic reflexes present.  Sensory: Intact to light touch Gait: Independent  Labs: I have reviewed the data as listed    Component Value Date/Time   NA 139 03/24/2023 0944   K 3.9 03/24/2023 0944   CL 100 03/24/2023 0944   CO2 33 (H) 03/24/2023 0944   GLUCOSE 107 (H) 03/24/2023 0944   BUN 18 03/24/2023 0944   CREATININE 0.78 03/24/2023 0944   CREATININE 0.79 03/13/2023 1005   CREATININE 0.79 03/14/2020 1016   CALCIUM 9.5 03/24/2023 0944   PROT 6.7 03/24/2023 0944   ALBUMIN 3.8 03/24/2023 0944   AST 20 03/24/2023 0944   AST 21 03/13/2023 1005   ALT 16 03/24/2023 0944  ALT 10 03/13/2023 1005   ALKPHOS 106 03/24/2023 0944   BILITOT 0.2 03/24/2023 0944   BILITOT 0.3 03/13/2023 1005   GFRNONAA >60 03/13/2023 1005   GFRNONAA 75 03/14/2020 1016   GFRAA 87 03/14/2020 1016   Lab Results  Component Value Date   WBC 4.0 03/13/2023   NEUTROABS 1.7 03/13/2023   HGB 11.0 (L) 03/13/2023   HCT 34.5 (L) 03/13/2023   MCV 84.6 03/13/2023   PLT 198 03/13/2023    Imaging:  [facility] Clinician Interpretation: I have personally reviewed the CNS images as listed.  My interpretation, in the context of the patient's clinical presentation, is treatment effect vs true progression  MR  BRAIN W WO CONTRAST  Result Date: 04/21/2023 CLINICAL DATA:  Brain/CNS neoplasm, assess treatment response. Metastatic lung cancer. EXAM: MRI HEAD WITHOUT AND WITH CONTRAST TECHNIQUE: Multiplanar, multiecho pulse sequences of the brain and surrounding structures were obtained without and with intravenous contrast. CONTRAST:  6 cc Vueway COMPARISON:  03/11/2023 FINDINGS: Brain: Patchy restricted diffusion in the left posterior temporal lobe and left frontal vertex is stable. Slight reduction in vasogenic edema and mass effect in the left parietal region. 3 x 4 mm focus of enhancement in the right lateral posterior temporal lobe series 13 axial image 60 is unchanged. 6 mm focus of enhancement above that within the right posterior temporal lobe axial image 69 is the same or 1 mm larger. This could be progression or pseudoprogression. Irregular enhancement in the left posterior temporal/occipital junction region underlying the calvarial defect appears similar, best evaluated in the coronal plane. Trapped tip of the occipital horn of the left lateral ventricle with ependymal enhancement appears similar. This could be progression or pseudoprogression, when compared to the study of May. No evidence of generalized hydrocephalus, leptomeningeal enhancement or other new enhancement. Chronic treated lesion at the left frontal vertex is stable without worrisome finding. Vascular: Major vessels at the base of the brain show flow. Skull and upper cervical spine: Otherwise negative Sinuses/Orbits: Clear/normal Other: None IMPRESSION: 1. Slight reduction in vasogenic edema and mass effect in the left parietal region. 2. 6 mm focus of enhancement in the right posterior temporal lobe axial image 69 is the same or 1 mm larger. This could be progression or pseudoprogression. 3. 3 x 4 mm focus of enhancement in the right temporal lobe axial image 60 is unchanged. 4. Irregular enhancement in the left posterior temporal/occipital  junction region underlying the calvarial defect appears similar, best evaluated in the coronal plane. As previously, this could represent progression or pseudoprogression when compared to the study of May. 5. Trapped tip of the occipital horn of the left lateral ventricle with ependymal enhancement appears similar. 6. Chronic treated lesion at the left frontal vertex is stable without worrisome finding. Electronically Signed   By: Paulina Fusi M.D.   On: 04/21/2023 15:09      Assessment/Plan Primary malignant neoplasm of lung with metastasis to brain [facility])  Talishia N Breese is clinically stable today.  MRI continues to demonstrate subtle and slow progression of treated right temporal metastasis.  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  Will continue MRI surveillance at this time given small change and lack of symptoms.  May dose 800mg  ibuprofen for acute headaches.  Recommended continuing Vimpat 100mg  BID and Keppra 500mg  BID liquid.  Decadron may continue 2mg  daily, may decrease if tolerated.    We appreciate the opportunity to participate in the care of [name].    We ask that  [name] return to clinic in 4 months following next brain MRI, or sooner as needed.  All questions were answered. The patient knows to call the clinic with any problems, questions or concerns. No barriers to learning were detected.  The total time spent in the encounter was 30 minutes and more than 50% was on counseling and review of test results   Henreitta Leber, MD Medical Director of Neuro-Oncology [facility] 04/28/23 10:58 AM

## 2023-04-29 ENCOUNTER — Telehealth: Payer: Self-pay | Admitting: Internal Medicine

## 2023-04-29 ENCOUNTER — Other Ambulatory Visit: Payer: Medicare HMO

## 2023-04-29 DIAGNOSIS — H04123 Dry eye syndrome of bilateral lacrimal glands: Secondary | ICD-10-CM | POA: Diagnosis not present

## 2023-04-29 DIAGNOSIS — H25811 Combined forms of age-related cataract, right eye: Secondary | ICD-10-CM | POA: Diagnosis not present

## 2023-04-29 DIAGNOSIS — H35371 Puckering of macula, right eye: Secondary | ICD-10-CM | POA: Diagnosis not present

## 2023-04-29 DIAGNOSIS — H1045 Other chronic allergic conjunctivitis: Secondary | ICD-10-CM | POA: Diagnosis not present

## 2023-04-29 NOTE — Telephone Encounter (Signed)
Spoke with patient confirming upcoming appointment  

## 2023-05-16 ENCOUNTER — Other Ambulatory Visit: Payer: Self-pay | Admitting: Internal Medicine

## 2023-05-16 DIAGNOSIS — C3490 Malignant neoplasm of unspecified part of unspecified bronchus or lung: Secondary | ICD-10-CM

## 2023-05-16 DIAGNOSIS — D496 Neoplasm of unspecified behavior of brain: Secondary | ICD-10-CM

## 2023-05-18 ENCOUNTER — Other Ambulatory Visit: Payer: Self-pay | Admitting: Internal Medicine

## 2023-05-18 ENCOUNTER — Encounter: Payer: Self-pay | Admitting: Internal Medicine

## 2023-05-18 DIAGNOSIS — D496 Neoplasm of unspecified behavior of brain: Secondary | ICD-10-CM

## 2023-05-18 DIAGNOSIS — C3490 Malignant neoplasm of unspecified part of unspecified bronchus or lung: Secondary | ICD-10-CM

## 2023-05-18 MED ORDER — LEVETIRACETAM 100 MG/ML PO SOLN: ORAL | 2 refills | Status: DC

## 2023-05-18 MED ORDER — LACOSAMIDE 100 MG PO TABS: 1.0000 | ORAL_TABLET | Freq: Two times a day (BID) | ORAL | 2 refills | Status: DC

## 2023-05-18 NOTE — Telephone Encounter (Signed)
Duplicate request

## 2023-05-22 ENCOUNTER — Encounter (HOSPITAL_COMMUNITY): Payer: Medicare HMO

## 2023-05-22 DIAGNOSIS — H25812 Combined forms of age-related cataract, left eye: Secondary | ICD-10-CM | POA: Diagnosis not present

## 2023-05-27 ENCOUNTER — Telehealth: Payer: Self-pay | Admitting: *Deleted

## 2023-05-27 NOTE — Telephone Encounter (Signed)
Spoke with patients daughter and requested move of port flush appt on 12/5 from 1p to 3pm.  Daughter agreed and confirmed that patient will be there.

## 2023-06-02 ENCOUNTER — Encounter: Payer: Self-pay | Admitting: Internal Medicine

## 2023-06-03 ENCOUNTER — Other Ambulatory Visit: Payer: Self-pay | Admitting: Medical Oncology

## 2023-06-04 ENCOUNTER — Inpatient Hospital Stay: Payer: Medicare HMO

## 2023-06-09 ENCOUNTER — Inpatient Hospital Stay: Payer: Medicare HMO | Attending: Internal Medicine

## 2023-06-09 ENCOUNTER — Other Ambulatory Visit: Payer: Self-pay | Admitting: Internal Medicine

## 2023-06-09 ENCOUNTER — Ambulatory Visit (HOSPITAL_COMMUNITY)
Admission: RE | Admit: 2023-06-09 | Discharge: 2023-06-09 | Disposition: A | Discharge status: Home / Self Care | Payer: Medicare HMO | Source: Ambulatory Visit | Attending: Internal Medicine | Admitting: Internal Medicine

## 2023-06-09 DIAGNOSIS — N3020 Other chronic cystitis without hematuria: Secondary | ICD-10-CM | POA: Insufficient documentation

## 2023-06-09 DIAGNOSIS — C3411 Malignant neoplasm of upper lobe, right bronchus or lung: Secondary | ICD-10-CM | POA: Diagnosis not present

## 2023-06-09 DIAGNOSIS — F419 Anxiety disorder, unspecified: Secondary | ICD-10-CM | POA: Insufficient documentation

## 2023-06-09 DIAGNOSIS — I739 Peripheral vascular disease, unspecified: Secondary | ICD-10-CM | POA: Diagnosis not present

## 2023-06-09 DIAGNOSIS — Z8719 Personal history of other diseases of the digestive system: Secondary | ICD-10-CM | POA: Insufficient documentation

## 2023-06-09 DIAGNOSIS — E785 Hyperlipidemia, unspecified: Secondary | ICD-10-CM | POA: Insufficient documentation

## 2023-06-09 DIAGNOSIS — I2510 Atherosclerotic heart disease of native coronary artery without angina pectoris: Secondary | ICD-10-CM | POA: Insufficient documentation

## 2023-06-09 DIAGNOSIS — Z888 Allergy status to other drugs, medicaments and biological substances status: Secondary | ICD-10-CM | POA: Insufficient documentation

## 2023-06-09 DIAGNOSIS — J984 Other disorders of lung: Secondary | ICD-10-CM | POA: Diagnosis not present

## 2023-06-09 DIAGNOSIS — F32A Depression, unspecified: Secondary | ICD-10-CM | POA: Diagnosis not present

## 2023-06-09 DIAGNOSIS — K8020 Calculus of gallbladder without cholecystitis without obstruction: Secondary | ICD-10-CM | POA: Diagnosis not present

## 2023-06-09 DIAGNOSIS — Z90710 Acquired absence of both cervix and uterus: Secondary | ICD-10-CM | POA: Insufficient documentation

## 2023-06-09 DIAGNOSIS — Z9226 Personal history of immune checkpoint inhibitor therapy: Secondary | ICD-10-CM | POA: Diagnosis not present

## 2023-06-09 DIAGNOSIS — Z902 Acquired absence of lung [part of]: Secondary | ICD-10-CM | POA: Insufficient documentation

## 2023-06-09 DIAGNOSIS — C3490 Malignant neoplasm of unspecified part of unspecified bronchus or lung: Secondary | ICD-10-CM | POA: Insufficient documentation

## 2023-06-09 DIAGNOSIS — Z95828 Presence of other vascular implants and grafts: Secondary | ICD-10-CM

## 2023-06-09 DIAGNOSIS — K529 Noninfective gastroenteritis and colitis, unspecified: Secondary | ICD-10-CM | POA: Insufficient documentation

## 2023-06-09 DIAGNOSIS — Z79899 Other long term (current) drug therapy: Secondary | ICD-10-CM | POA: Insufficient documentation

## 2023-06-09 DIAGNOSIS — Z87891 Personal history of nicotine dependence: Secondary | ICD-10-CM | POA: Insufficient documentation

## 2023-06-09 DIAGNOSIS — K219 Gastro-esophageal reflux disease without esophagitis: Secondary | ICD-10-CM | POA: Insufficient documentation

## 2023-06-09 DIAGNOSIS — I700 Atherosclerosis of aorta: Secondary | ICD-10-CM | POA: Insufficient documentation

## 2023-06-09 DIAGNOSIS — K5730 Diverticulosis of large intestine without perforation or abscess without bleeding: Secondary | ICD-10-CM | POA: Insufficient documentation

## 2023-06-09 DIAGNOSIS — C3491 Malignant neoplasm of unspecified part of right bronchus or lung: Secondary | ICD-10-CM

## 2023-06-09 DIAGNOSIS — C7931 Secondary malignant neoplasm of brain: Secondary | ICD-10-CM | POA: Insufficient documentation

## 2023-06-09 LAB — CMP (CANCER CENTER ONLY)
ALT: 20 U/L (ref 0–44)
AST: 41 U/L (ref 15–41)
Albumin: 3.5 g/dL (ref 3.5–5.0)
Alkaline Phosphatase: 59 U/L (ref 38–126)
Anion gap: 5 (ref 5–15)
BUN: 9 mg/dL (ref 8–23)
CO2: 29 mmol/L (ref 22–32)
Calcium: 9.2 mg/dL (ref 8.9–10.3)
Chloride: 108 mmol/L (ref 98–111)
Creatinine: 0.72 mg/dL (ref 0.44–1.00)
GFR, Estimated: >60 mL/min (ref >60)
Glucose, Bld: 102 mg/dL — ABNORMAL HIGH (ref 70–99)
Potassium: 3.5 mmol/L (ref 3.5–5.1)
Sodium: 142 mmol/L (ref 135–145)
Total Bilirubin: 0.3 mg/dL (ref <1.2)
Total Protein: 6.1 g/dL — ABNORMAL LOW (ref 6.5–8.1)

## 2023-06-09 LAB — CBC WITH DIFFERENTIAL (CANCER CENTER ONLY)
Abs Immature Granulocytes: 0.03 10*3/uL (ref 0.00–0.07)
Basophils Absolute: 0 10*3/uL (ref 0.0–0.1)
Basophils Relative: 1 %
Eosinophils Absolute: 0.1 10*3/uL (ref 0.0–0.5)
Eosinophils Relative: 2 %
HCT: 33.1 % — ABNORMAL LOW (ref 36.0–46.0)
Hemoglobin: 10.1 g/dL — ABNORMAL LOW (ref 12.0–15.0)
Immature Granulocytes: 1 %
Lymphocytes Relative: 35 %
Lymphs Abs: 2 10*3/uL (ref 0.7–4.0)
MCH: 26 pg (ref 26.0–34.0)
MCHC: 30.5 g/dL (ref 30.0–36.0)
MCV: 85.1 fL (ref 80.0–100.0)
Monocytes Absolute: 0.6 10*3/uL (ref 0.1–1.0)
Monocytes Relative: 11 %
Neutro Abs: 2.9 10*3/uL (ref 1.7–7.7)
Neutrophils Relative %: 50 %
Platelet Count: 233 10*3/uL (ref 150–400)
RBC: 3.89 MIL/uL (ref 3.87–5.11)
RDW: 13.7 % (ref 11.5–15.5)
WBC Count: 5.6 10*3/uL (ref 4.0–10.5)
nRBC: 0 % (ref 0.0–0.2)

## 2023-06-09 MED ORDER — HEPARIN SOD (PORK) LOCK FLUSH 100 UNIT/ML IV SOLN
500.0000 [IU] | Freq: Once | INTRAVENOUS | Status: AC
  Administered 2023-06-09: 500 [IU] via INTRAVENOUS

## 2023-06-09 MED ORDER — SODIUM CHLORIDE 0.9% FLUSH
10.0000 mL | Freq: Once | INTRAVENOUS | Status: AC
  Administered 2023-06-09: 10 mL

## 2023-06-09 MED ORDER — IOHEXOL 300 MG/ML  SOLN
100.0000 mL | Freq: Once | INTRAMUSCULAR | Status: AC | PRN
  Administered 2023-06-09: 100 mL via INTRAVENOUS

## 2023-06-09 MED ORDER — HEPARIN SOD (PORK) LOCK FLUSH 100 UNIT/ML IV SOLN
INTRAVENOUS | Status: AC
Start: 2023-06-09 — End: ?
  Filled 2023-06-09: qty 5

## 2023-06-09 NOTE — Progress Notes (Unsigned)
Rubin Payor, PhD, LAT, ATC acting as a scribe for Clementeen Graham, MD.  Hannah [name] is a 72 y.o. female who presents to [facility] today for exacerbation of her bilat shoulder pain. Pt was last seen by Dr. Denyse Amass on 10/22/22 bilat subacromial steroid injections and she declined a referral to PT. Last bilat GH steroid injections were on 09/16/22.  Today, pt report ***  Dx imaging: 12/11/21 R & L shoulder XR   Pertinent review of systems: ***  Relevant historical information: ***   Exam:  There were no vitals taken for this visit. General: Well Developed, well nourished, and in no acute distress.   MSK: ***    Lab and Radiology Results Results for orders placed or performed in visit on 06/09/23 (from the past 72 hour(s))  CMP (Cancer Center only)     Status: Abnormal   Collection Time: 06/09/23  2:32 PM  Result Value Ref Range   Sodium 142 135 - 145 mmol/L   Potassium 3.5 3.5 - 5.1 mmol/L   Chloride 108 98 - 111 mmol/L   CO2 29 22 - 32 mmol/L   Glucose, Bld 102 (H) 70 - 99 mg/dL    Comment: Glucose reference range applies only to samples taken after fasting for at least 8 hours.   BUN 9 8 - 23 mg/dL   Creatinine 1.61 0.96 - 1.00 mg/dL   Calcium 9.2 8.9 - 04.5 mg/dL   Total Protein 6.1 (L) 6.5 - 8.1 g/dL   Albumin 3.5 3.5 - 5.0 g/dL   AST 41 15 - 41 U/L   ALT 20 0 - 44 U/L   Alkaline Phosphatase 59 38 - 126 U/L   Total Bilirubin 0.3 <1.2 mg/dL   GFR, Estimated >40 >98 mL/min    Comment: (NOTE) Calculated using the CKD-EPI Creatinine Equation (2021)    Anion gap 5 5 - 15    Comment: Performed at [facility], [address] [place], [place] [address]  CBC with Differential (Cancer Center Only)     Status: Abnormal   Collection Time: 06/09/23  2:32 PM  Result Value Ref Range   WBC Count 5.6 4.0 - 10.5 K/uL   RBC 3.89 3.87 - 5.11 MIL/uL   Hemoglobin 10.1 (L) 12.0 - 15.0 g/dL   HCT 78.2 (L) 95.6 - 21.3 %    MCV 85.1 80.0 - 100.0 fL   MCH 26.0 26.0 - 34.0 pg   MCHC 30.5 30.0 - 36.0 g/dL   RDW 08.6 57.8 - 46.9 %   Platelet Count 233 150 - 400 K/uL   nRBC 0.0 0.0 - 0.2 %   Neutrophils Relative % 50 %   Neutro Abs 2.9 1.7 - 7.7 K/uL   Lymphocytes Relative 35 %   Lymphs Abs 2.0 0.7 - 4.0 K/uL   Monocytes Relative 11 %   Monocytes Absolute 0.6 0.1 - 1.0 K/uL   Eosinophils Relative 2 %   Eosinophils Absolute 0.1 0.0 - 0.5 K/uL   Basophils Relative 1 %   Basophils Absolute 0.0 0.0 - 0.1 K/uL   Immature Granulocytes 1 %   Abs Immature Granulocytes 0.03 0.00 - 0.07 K/uL    Comment: Performed at [facility], [address] [place], [place] [address]   *Note: Due to a large number of results and/or encounters for the requested time period, some results have not been displayed. A complete set of results can be found  in Results Review.   No results found.     Assessment and Plan: 73 y.o. female with ***   PDMP not reviewed this encounter. No orders of the defined types were placed in this encounter.  No orders of the defined types were placed in this encounter.    Discussed warning signs or symptoms. Please see discharge instructions. Patient expresses understanding.   ***

## 2023-06-11 ENCOUNTER — Ambulatory Visit: Payer: Medicare HMO | Admitting: Family Medicine

## 2023-06-12 DIAGNOSIS — H25811 Combined forms of age-related cataract, right eye: Secondary | ICD-10-CM | POA: Diagnosis not present

## 2023-06-14 ENCOUNTER — Emergency Department (HOSPITAL_BASED_OUTPATIENT_CLINIC_OR_DEPARTMENT_OTHER)
Admission: EM | Admit: 2023-06-14 | Discharge: 2023-06-14 | Disposition: A | Discharge status: Home / Self Care | Payer: Medicare HMO | Attending: Emergency Medicine | Admitting: Emergency Medicine

## 2023-06-14 ENCOUNTER — Emergency Department (HOSPITAL_BASED_OUTPATIENT_CLINIC_OR_DEPARTMENT_OTHER): Payer: Medicare HMO

## 2023-06-14 ENCOUNTER — Encounter (HOSPITAL_BASED_OUTPATIENT_CLINIC_OR_DEPARTMENT_OTHER): Payer: Self-pay | Admitting: Emergency Medicine

## 2023-06-14 ENCOUNTER — Other Ambulatory Visit: Payer: Self-pay

## 2023-06-14 DIAGNOSIS — K5900 Constipation, unspecified: Secondary | ICD-10-CM | POA: Insufficient documentation

## 2023-06-14 DIAGNOSIS — K5730 Diverticulosis of large intestine without perforation or abscess without bleeding: Secondary | ICD-10-CM | POA: Diagnosis not present

## 2023-06-14 DIAGNOSIS — K529 Noninfective gastroenteritis and colitis, unspecified: Secondary | ICD-10-CM | POA: Insufficient documentation

## 2023-06-14 DIAGNOSIS — N3090 Cystitis, unspecified without hematuria: Secondary | ICD-10-CM | POA: Diagnosis not present

## 2023-06-14 DIAGNOSIS — K8020 Calculus of gallbladder without cholecystitis without obstruction: Secondary | ICD-10-CM | POA: Diagnosis not present

## 2023-06-14 DIAGNOSIS — R1032 Left lower quadrant pain: Secondary | ICD-10-CM | POA: Diagnosis present

## 2023-06-14 LAB — COMPREHENSIVE METABOLIC PANEL
ALT: 20 U/L (ref 0–44)
AST: 29 U/L (ref 15–41)
Albumin: 3.7 g/dL (ref 3.5–5.0)
Alkaline Phosphatase: 55 U/L (ref 38–126)
Anion gap: 4 — ABNORMAL LOW (ref 5–15)
BUN: 11 mg/dL (ref 8–23)
CO2: 32 mmol/L (ref 22–32)
Calcium: 9.5 mg/dL (ref 8.9–10.3)
Chloride: 105 mmol/L (ref 98–111)
Creatinine, Ser: 0.71 mg/dL (ref 0.44–1.00)
GFR, Estimated: >60 mL/min (ref >60)
Glucose, Bld: 93 mg/dL (ref 70–99)
Potassium: 3.6 mmol/L (ref 3.5–5.1)
Sodium: 141 mmol/L (ref 135–145)
Total Bilirubin: 0.3 mg/dL (ref <1.2)
Total Protein: 6.3 g/dL — ABNORMAL LOW (ref 6.5–8.1)

## 2023-06-14 LAB — CBC
HCT: 34.8 % — ABNORMAL LOW (ref 36.0–46.0)
Hemoglobin: 10.8 g/dL — ABNORMAL LOW (ref 12.0–15.0)
MCH: 26.2 pg (ref 26.0–34.0)
MCHC: 31 g/dL (ref 30.0–36.0)
MCV: 84.3 fL (ref 80.0–100.0)
Platelets: 203 10*3/uL (ref 150–400)
RBC: 4.13 MIL/uL (ref 3.87–5.11)
RDW: 13.8 % (ref 11.5–15.5)
WBC: 6.8 10*3/uL (ref 4.0–10.5)
nRBC: 0 % (ref 0.0–0.2)

## 2023-06-14 LAB — URINALYSIS, ROUTINE W REFLEX MICROSCOPIC
Bilirubin Urine: NEGATIVE
Glucose, UA: NEGATIVE mg/dL
Hgb urine dipstick: NEGATIVE
Ketones, ur: NEGATIVE mg/dL
Nitrite: NEGATIVE
Protein, ur: NEGATIVE mg/dL
Specific Gravity, Urine: 1.018 (ref 1.005–1.030)
pH: 7 (ref 5.0–8.0)

## 2023-06-14 LAB — LIPASE, BLOOD: Lipase: 24 U/L (ref 11–51)

## 2023-06-14 MED ORDER — AMOXICILLIN-POT CLAVULANATE 875-125 MG PO TABS
1.0000 | ORAL_TABLET | Freq: Once | ORAL | Status: AC
  Administered 2023-06-14: 1 via ORAL
  Filled 2023-06-14: qty 1

## 2023-06-14 MED ORDER — AMOXICILLIN-POT CLAVULANATE 400-57 MG/5ML PO SUSR: 500.0000 mg | Freq: Three times a day (TID) | ORAL | 0 refills | Status: DC

## 2023-06-14 MED ORDER — IOHEXOL 300 MG/ML  SOLN
80.0000 mL | Freq: Once | INTRAMUSCULAR | Status: AC | PRN
  Administered 2023-06-14: 80 mL via INTRAVENOUS

## 2023-06-14 MED ORDER — HYDROCODONE-ACETAMINOPHEN 7.5-325 MG/15ML PO SOLN: ORAL | 0 refills | Status: DC

## 2023-06-14 NOTE — Discharge Instructions (Signed)
1.  Start taking Augmentin as prescribed.  You may take Hycet 15 mL every 6 hours if needed for severe pain.  This is a combination of hydrocodone and acetaminophen in a liquid form.  As soon as your pain is adequately controlled, start taking regular Tylenol and discontinue the Hycet which has a hydrocodone narcotic in it. 2.  Schedule an appointment with your gastroenterologist for follow-up with soon as possible. 3.  Return to the emergency department if you have fever, worsening pain, vomiting and cannot take your medication or other concerning changes.

## 2023-06-14 NOTE — ED Notes (Signed)
Patient transported to CT via WC

## 2023-06-14 NOTE — ED Triage Notes (Signed)
Left lower back and lower left abdominal pain for about 4 days. Pt noticed small amount of blood in stool earlier this week, today she felt like she had a blood clot drop in the toilet.bright red blood. Pt has had hemorrhoid surgery. Chronic constipation,some straining and recently started linzess

## 2023-06-14 NOTE — ED Provider Notes (Signed)
[place] EMERGENCY DEPARTMENT AT [facility] Provider Note   CSN: [id number] Arrival date & time: 06/14/23  1009     History {Add pertinent medical, surgical, social history, OB history to HPI:1} Chief Complaint  Patient presents with   Abdominal Pain    Hannah [name] is a 72 y.o. female.  Patient is a 72 year old female presenting for abdominal pain.  Patient admits to left-sided flank pain that radiates to the left abdomen and left lower abdomen x 4 days.  She admits to history of intermittent constipation.  She admits to nausea with 1 episode of self-induced vomiting yesterday.  She denies fevers or chills.  Had very small bowel movement this morning.  Had some blood streaking on toilet paper today.  The history is provided by the patient. No language interpreter was used.  Abdominal Pain Associated symptoms: constipation, nausea and vomiting   Associated symptoms: no chest pain, no chills, no cough, no dysuria, no fever, no hematuria, no shortness of breath and no sore throat        Home Medications Prior to Admission medications   Medication Sig Start Date End Date Taking? Authorizing Provider  acetaminophen (TYLENOL) 325 MG tablet Take 325 mg by mouth every 6 (six) hours as needed for moderate pain.    [provider]  ALPRAZolam (XANAX XR) 0.5 MG 24 hr tablet Take 1 tablet (0.5 mg total) by mouth every morning. 03/24/23   Pincus Sanes, MD  ALPRAZolam Prudy Feeler) 0.5 MG tablet Take 1 tablet (0.5 mg total) by mouth 3 (three) times daily as needed. for anxiety 03/24/23   Pincus Sanes, MD  aspirin 81 MG tablet Take 1 tablet (81 mg total) by mouth daily. Restart on 08/31/20 08/25/20   Jadene Pierini, MD  bisacodyl (DULCOLAX) 5 MG EC tablet Take 5 mg by mouth daily as needed for moderate constipation.    [provider]  carboxymethylcellulose (REFRESH PLUS) 0.5 % SOLN Place 1-2 drops into both eyes 3 (three) times daily as needed (dry eyes).     [provider]  Cholecalciferol (D3 PO) Take 1 capsule by mouth daily.    [provider]  citalopram (CELEXA) 10 MG tablet Take 1 tablet (10 mg total) by mouth daily. 03/24/23   Pincus Sanes, MD  dexamethasone (DECADRON) 2 MG tablet Take 1 tablet (2 mg total) by mouth daily. 04/22/23   Henreitta Leber, MD  fluticasone (FLONASE) 50 MCG/ACT nasal spray USE 2 SPRAYS IN EACH NOSTRIL EVERY DAY 10/17/22   Pincus Sanes, MD  folic acid (FOLVITE) 1 MG tablet Take 1 tablet by mouth once daily 01/13/23   Henreitta Leber, MD  ketoconazole (NIZORAL) 2 % cream APPLY 1 APPLICATION TOPICALLY DAILY 11/07/22   Corwin Levins, MD  Lacosamide 100 MG TABS Take 1 tablet (100 mg total) by mouth 2 (two) times daily. 05/18/23   Henreitta Leber, MD  levETIRAcetam (KEPPRA) 100 MG/ML solution TAKE 15 ML BY MOUTH  TWICE DAILY 05/18/23   Henreitta Leber, MD  lidocaine-prilocaine (EMLA) cream Apply 1 Application topically as needed. 05/01/22   Heilingoetter, Cassandra L, PA-C  linaclotide (LINZESS) 72 MCG capsule Take 1 capsule (72 mcg total) by mouth daily before breakfast. Take 30 minutes prior to breakfast Patient not taking: Reported on 04/28/2023 03/11/23   Unk Lightning, PA  loratadine (CLARITIN) 10 MG tablet Take 10 mg by mouth daily as needed for allergies.    [provider]  magnesium  hydroxide (MILK OF MAGNESIA) 800 MG/5ML suspension Take 30 mLs by mouth daily as needed for constipation.    [provider]  Menthol, Topical Analgesic, (BIOFREEZE EX) Apply 1 application topically as needed (pain).    [provider]  mirtazapine (REMERON SOL-TAB) 15 MG disintegrating tablet Take 1 tablet (15 mg total) by mouth at bedtime. 03/24/23   Pincus Sanes, MD  olopatadine (PATANOL) 0.1 % ophthalmic solution Place 1 drop into both eyes 2 (two) times daily as needed for allergies.    [provider]  omeprazole (PRILOSEC) 40 MG capsule Take 1 capsule (40 mg  total) by mouth daily. 03/24/23   Pincus Sanes, MD  polyethylene glycol (MIRALAX / GLYCOLAX) 17 g packet Take 17 g by mouth 2 (two) times daily. 08/19/20   Rodolph Bong, MD  pravastatin (PRAVACHOL) 80 MG tablet Take 1 tablet (80 mg total) by mouth every evening. 03/26/23   Rollene Rotunda, MD  Probiotic Product (PROBIOTIC DAILY PO) Take 1 capsule by mouth daily.    [provider]  prochlorperazine (COMPAZINE) 10 MG tablet TAKE 1 TABLET BY MOUTH EVERY 6 HOURS AS NEEDED FOR NAUSEA OR VOMITING 11/09/21   Si Gaul, MD  simethicone (MYLICON) 80 MG chewable tablet Chew 1 tablet (80 mg total) by mouth 4 (four) times daily as needed for flatulence (Bloating). 09/28/20   [name], PA-C      Allergies    Amlodipine, Chantix [varenicline tartrate], Clarithromycin, Lisinopril, Simvastatin, Wellbutrin [bupropion hcl], Lipitor [atorvastatin], and Sertraline    Review of Systems   Review of Systems  Constitutional:  Negative for chills and fever.  HENT:  Negative for ear pain and sore throat.   Eyes:  Negative for pain and visual disturbance.  Respiratory:  Negative for cough and shortness of breath.   Cardiovascular:  Negative for chest pain and palpitations.  Gastrointestinal:  Positive for abdominal pain, constipation, nausea and vomiting.  Genitourinary:  Negative for dysuria and hematuria.  Musculoskeletal:  Negative for arthralgias and back pain.  Skin:  Negative for color change and rash.  Neurological:  Negative for seizures and syncope.  All other systems reviewed and are negative.   Physical Exam Updated Vital Signs BP (!) 158/79 (BP Location: Right Arm)   Pulse 68   Temp 98.2 F (36.8 C) (Oral)   Resp 17   Ht 5' (1.524 m)   Wt 63.5 kg   SpO2 96%   BMI 27.34 kg/m  Physical Exam Vitals and nursing note reviewed.  Constitutional:      General: She is not in acute distress.    Appearance: She is well-developed.  HENT:     Head: Normocephalic and  atraumatic.  Eyes:     Conjunctiva/sclera: Conjunctivae normal.  Cardiovascular:     Rate and Rhythm: Normal rate and regular rhythm.     Heart sounds: No murmur heard. Pulmonary:     Effort: Pulmonary effort is normal. No respiratory distress.     Breath sounds: Normal breath sounds.  Abdominal:     Palpations: Abdomen is soft.     Tenderness: There is abdominal tenderness in the epigastric area and left lower quadrant. There is no guarding or rebound.  Musculoskeletal:        General: No swelling.     Cervical back: Neck supple.  Skin:    General: Skin is warm and dry.     Capillary Refill: Capillary refill takes less than 2 seconds.  Neurological:  Mental Status: She is alert.  Psychiatric:        Mood and Affect: Mood normal.     ED Results / Procedures / Treatments   Labs (all labs ordered are listed, but only abnormal results are displayed) Labs Reviewed  COMPREHENSIVE METABOLIC PANEL - Abnormal; Notable for the following components:      Result Value   Total Protein 6.3 (*)    Anion gap 4 (*)    All other components within normal limits  CBC - Abnormal; Notable for the following components:   Hemoglobin 10.8 (*)    HCT 34.8 (*)    All other components within normal limits  URINALYSIS, ROUTINE W REFLEX MICROSCOPIC - Abnormal; Notable for the following components:   Leukocytes,Ua TRACE (*)    Bacteria, UA RARE (*)    All other components within normal limits  LIPASE, BLOOD    EKG None  Radiology No results found.  Procedures Procedures  {Document cardiac monitor, telemetry assessment procedure when appropriate:1}  Medications Ordered in ED Medications  iohexol (OMNIPAQUE) 300 MG/ML solution 80 mL (has no administration in time range)    ED Course/ Medical Decision Making/ A&P   {   Click here for ABCD2, HEART and other calculatorsREFRESH Note before signing :1}                              Medical Decision Making Amount and/or Complexity of  Data Reviewed Labs: ordered. Radiology: ordered.  Risk Prescription drug management.   72 year old female presenting for left-sided flank pain with radiation to the left lower quadrant.  Patient is alert and oriented x 3, no acute distress, afebrile, stable to signs.    Differential diagnosis includes but is not limited to diverticulitis, colitis, masses/malignancy, constipation, bowel obstruction.  Other etiologies of flank pain could include pyelonephritis and/or ureterolithiasis.  Physical exam demonstrates tenderness palpation of the left lower quadrant only.  No current nausea or vomiting.  Laboratory studies demonstrate no leukocytosis.  Stable liver profile, lipase, and renal function.  These demonstrate no hematuria.  Less likely to be Ortho lithiasis.  No urinary tract infection.  So no pyelonephritis.  CT abdomen and pelvis pending.    {Document critical care time when appropriate:1} {Document review of labs and clinical decision tools ie heart score, Chads2Vasc2 etc:1}  {Document your independent review of radiology images, and any outside records:1} {Document your discussion with family members, caretakers, and with consultants:1} {Document social determinants of health affecting pt's care:1} {Document your decision making why or why not admission, treatments were needed:1} Final Clinical Impression(s) / ED Diagnoses Final diagnoses:  None    Rx / DC Orders ED Discharge Orders     None

## 2023-06-14 NOTE — ED Provider Notes (Signed)
CT pending. If negative, anticipate d/c. Declining pain or nausea meds. Physical Exam  BP (!) 141/81 (BP Location: Right Arm)   Pulse 67   Temp 98.2 F (36.8 C) (Oral)   Resp 18   Ht 5' (1.524 m)   Wt 63.5 kg   SpO2 96%   BMI 27.34 kg/m   Physical Exam  Procedures  Procedures  ED Course / MDM    Medical Decision Making Amount and/or Complexity of Data Reviewed Labs: ordered. Radiology: ordered.  Risk Prescription drug management.    CT scan shows descending colitis.  I have reviewed this with the patient and her daughter.  Patient's daughter reports that the patient has difficulty with taking pills.  Plan will be for Augmentin 3 times a day and as needed Hycet for pain control.  Patient has a gastroenterologist with whom they can follow-up.  Plan reviewed with the patient and her daughter and return precautions reviewed.      Arby Barrette, MD 06/14/23 [phone]

## 2023-06-15 ENCOUNTER — Encounter: Payer: Self-pay | Admitting: Internal Medicine

## 2023-06-15 NOTE — Telephone Encounter (Signed)
 Care team updated and letter sent for eye exam notes.

## 2023-06-17 ENCOUNTER — Ambulatory Visit: Payer: Medicare HMO | Admitting: Family Medicine

## 2023-06-17 ENCOUNTER — Other Ambulatory Visit: Payer: Self-pay

## 2023-06-17 ENCOUNTER — Encounter: Payer: Self-pay | Admitting: Family Medicine

## 2023-06-17 VITALS — BP 126/80 | HR 77 | Ht 60.0 in | Wt 132.0 lb

## 2023-06-17 DIAGNOSIS — C3411 Malignant neoplasm of upper lobe, right bronchus or lung: Secondary | ICD-10-CM

## 2023-06-17 DIAGNOSIS — M25511 Pain in right shoulder: Secondary | ICD-10-CM

## 2023-06-17 DIAGNOSIS — M25512 Pain in left shoulder: Secondary | ICD-10-CM | POA: Diagnosis not present

## 2023-06-17 DIAGNOSIS — G8929 Other chronic pain: Secondary | ICD-10-CM | POA: Diagnosis not present

## 2023-06-17 NOTE — Progress Notes (Signed)
[facility] OFFICE PROGRESS NOTE  Hannah Sanes, Hannah [name] [address] [place] [address]  DIAGNOSIS:  Stage IV non-small cell lung cancer (T3, N0, M1C) adenocarcinoma.  The patient presented with a and solitary brain metastasis.  The patient was diagnosed in February 2022.   Biomarker Findings Microsatellite status - MS-Stable Tumor Mutational Burden - 8 Muts/Mb Genomic Findings For a complete list of the genes assayed, please refer to the Appendix. KRAS G12V KEAP1 S224F TP53 P151T 7 Disease relevant genes with no reportable alterations: ALK, BRAF, EGFR, ERBB2, MET, RET, ROS1   PDL1 Expression: 50%  PRIOR THERAPY:  1) SRS to the metastatic brain lesion on 08/23/2020 under the care of Dr. [name] and craniotomy under the care of Dr. Maurice Small on 08/24/2020. 2) S/p robotic assisted right upper lobectomy with en bloc wedge resection of the right middle lobe and lymph node dissection under the care of Dr. Dorris Fetch on September 24, 2020 3) SRS to the two new subcentimeter metastases under the care of Dr. [name] on 11/29/20.  4) 10/14/21: L frontoparietal progression, undergoes LITT at [facility]) 5) SRS to brain metastasis, last dose on 11/18/21 6) Systemic chemotherapy with carboplatin for AUC of 5, Alimta 500 Mg/M2 and Keytruda 200 mg IV every 3 weeks.  First dose Nov 07, 2020.  Status post 35 cycles.  Starting from cycle #5 the patient will be on maintenance treatment with Alimta and Keytruda every 3 weeks.   CURRENT THERAPY: Observation   INTERVAL HISTORY: Hannah [name] 72 y.o. female returns to the clinic today for a follow up visit accompanied by her daughter. She was last seen in the clinic on 03/17/23. She is on observation. She completed 2 years of treatment.   She is currently on observation and feeling well except for stable fatigue. She had some radiation necrosis vs neoplasm and neurological symptoms in September 2024. She is followed closely by Dr. Barbaraann Cao.  She had short term interval follow up scan and steroids. She is also on keppra and Vimpat. Her next brain MRI is scheduled for 08/19/22.   The patient was seen in the ER on 12/15 for the chief complaint of abdominal pain. She had a CT scan which showed mild to moderate colitis involving the descending colon. They will reach out to her GI office to follow up after she completes her Augmentin. Interestingly, her restaging CT from 12/10 did now show any colitis. However, her 12/10 mentioned some bladder changes. The patient had a UA in the Er which did not show any infection. She denies any dysuria or malodorous urine.   Today, she is feeling "good". She has a good appetite  She denies any fever or chills.  Her breathing is fine. She denies significant dyspnea on exertion.  Denies any cough, chest pain, or hemoptysis.  Denies any nausea, vomiting, or diarrhea. She is planning on restarting her linzess soon. Denies any rashes or skin changes. She denies headaches or vision changes. She recently had a restaging CT scan.  She is here today for evaluation and to review her scan results and discuss next steps in her care.  MEDICAL HISTORY: Past Medical History:  Diagnosis Date   Allergy    Anemia    Anxiety    Arthritis    Carpal tunnel syndrome    Constipation    senna C stool softeners help    Depression    Diverticulosis    Dyslipidemia    External hemorrhoids  GERD (gastroesophageal reflux disease)    Heart murmur    mild-moderate AR   Hiatal hernia    Hyperlipidemia    on meds    Hypertension    Internal hemorrhoids    lung ca with brain mets 07/2020   Osteoarthritis    Pre-diabetes    PVD (peripheral vascular disease) (HCC)    moderate carotid disease   RBBB    Smoker    Vocal cord polyps     ALLERGIES:  is allergic to amlodipine, chantix [varenicline tartrate], clarithromycin, lisinopril, simvastatin, wellbutrin [bupropion hcl], lipitor [atorvastatin], and  sertraline.  MEDICATIONS:  Current Outpatient Medications  Medication Sig Dispense Refill   acetaminophen (TYLENOL) 325 MG tablet Take 325 mg by mouth every 6 (six) hours as needed for moderate pain.     ALPRAZolam (XANAX XR) 0.5 MG 24 hr tablet Take 1 tablet (0.5 mg total) by mouth every morning. 30 tablet 5   ALPRAZolam (XANAX) 0.5 MG tablet Take 1 tablet (0.5 mg total) by mouth 3 (three) times daily as needed. for anxiety 90 tablet 5   amoxicillin-clavulanate (AUGMENTIN) 400-57 MG/5ML suspension Take 6.3 mLs (504 mg total) by mouth in the morning, at noon, and at bedtime. 140 mL 0   aspirin 81 MG tablet Take 1 tablet (81 mg total) by mouth daily. Restart on 08/31/20 30 tablet    bisacodyl (DULCOLAX) 5 MG EC tablet Take 5 mg by mouth daily as needed for moderate constipation.     carboxymethylcellulose (REFRESH PLUS) 0.5 % SOLN Place 1-2 drops into both eyes 3 (three) times daily as needed (dry eyes).     Cholecalciferol (D3 PO) Take 1 capsule by mouth daily.     citalopram (CELEXA) 10 MG tablet Take 1 tablet (10 mg total) by mouth daily. 90 tablet 1   dexamethasone (DECADRON) 2 MG tablet Take 1 tablet (2 mg total) by mouth daily. 30 tablet 0   fluticasone (FLONASE) 50 MCG/ACT nasal spray USE 2 SPRAYS IN EACH NOSTRIL EVERY DAY 48 g 3   folic acid (FOLVITE) 1 MG tablet Take 1 tablet by mouth once daily 30 tablet 0   HYDROcodone-acetaminophen (HYCET) 7.5-325 mg/15 ml solution 15 ml every 6 hours as needed for moderate to severe pain 150 mL 0   ketoconazole (NIZORAL) 2 % cream APPLY 1 APPLICATION TOPICALLY DAILY 30 g 0   Lacosamide 100 MG TABS Take 1 tablet (100 mg total) by mouth 2 (two) times daily. 60 tablet 2   levETIRAcetam (KEPPRA) 100 MG/ML solution TAKE 15 ML BY MOUTH  TWICE DAILY 473 mL 2   lidocaine-prilocaine (EMLA) cream Apply 1 Application topically as needed. 30 g 2   linaclotide (LINZESS) 72 MCG capsule Take 1 capsule (72 mcg total) by mouth daily before breakfast. Take 30 minutes  prior to breakfast 30 capsule 5   loratadine (CLARITIN) 10 MG tablet Take 10 mg by mouth daily as needed for allergies.     magnesium hydroxide (MILK OF MAGNESIA) 800 MG/5ML suspension Take 30 mLs by mouth daily as needed for constipation.     Menthol, Topical Analgesic, (BIOFREEZE EX) Apply 1 application topically as needed (pain).     mirtazapine (REMERON SOL-TAB) 15 MG disintegrating tablet Take 1 tablet (15 mg total) by mouth at bedtime. 90 tablet 1   olopatadine (PATANOL) 0.1 % ophthalmic solution Place 1 drop into both eyes 2 (two) times daily as needed for allergies.     omeprazole (PRILOSEC) 40 MG capsule Take 1 capsule (40 mg  total) by mouth daily. 90 capsule 1   polyethylene glycol (MIRALAX / GLYCOLAX) 17 g packet Take 17 g by mouth 2 (two) times daily. 72 each 0   pravastatin (PRAVACHOL) 80 MG tablet Take 1 tablet (80 mg total) by mouth every evening. 90 tablet 3   Probiotic Product (PROBIOTIC DAILY PO) Take 1 capsule by mouth daily.     prochlorperazine (COMPAZINE) 10 MG tablet TAKE 1 TABLET BY MOUTH EVERY 6 HOURS AS NEEDED FOR NAUSEA OR VOMITING 30 tablet 0   simethicone (MYLICON) 80 MG chewable tablet Chew 1 tablet (80 mg total) by mouth 4 (four) times daily as needed for flatulence (Bloating). 30 tablet 0   No current facility-administered medications for this visit.    SURGICAL HISTORY:  Past Surgical History:  Procedure Laterality Date   ABDOMINAL HYSTERECTOMY  1994   APPLICATION OF CRANIAL NAVIGATION N/A 08/24/2020   Procedure: APPLICATION OF CRANIAL NAVIGATION;  Surgeon: Jadene Pierini, Hannah [name];  Location: [facility] OR;  Service: Neurosurgery;  Laterality: N/A;   COLONOSCOPY     COLONOSCOPY W/ POLYPECTOMY  2009   CRANIOTOMY Left 08/24/2020   Procedure: Left Craniotomy for Tumor Resection with Brainlab;  Surgeon: Jadene Pierini, Hannah [name];  Location: [facility] OR;  Service: Neurosurgery;  Laterality: Left;   HEMORRHOID SURGERY     INTERCOSTAL NERVE BLOCK Right 09/24/2020   Procedure:  INTERCOSTAL NERVE BLOCK;  Surgeon: Loreli Slot, Hannah [name];  Location: [facility] OR;  Service: Thoracic;  Laterality: Right;   IR IMAGING GUIDED PORT INSERTION  05/15/2022   JOINT REPLACEMENT     LYMPH NODE DISSECTION Right 09/24/2020   Procedure: LYMPH NODE DISSECTION;  Surgeon: Loreli Slot, Hannah [name];  Location: [facility] OR;  Service: Thoracic;  Laterality: Right;   POLYPECTOMY     right total knee arthroplasty     Dr. Sherle Poe 06-04-18   SPINE SURGERY  08/16/2009   TOTAL KNEE ARTHROPLASTY Left 02/13/2014   Procedure: TOTAL KNEE ARTHROPLASTY;  Surgeon: Harvie Junior, Hannah [name];  Location: [facility] OR;  Service: Orthopedics;  Laterality: Left;   TOTAL KNEE ARTHROPLASTY Right 06/04/2018   Procedure: RIGHT TOTAL KNEE ARTHROPLASTY;  Surgeon: Jodi Geralds, Hannah [name];  Location: [facility] ORS;  Service: Orthopedics;  Laterality: Right;  Adductor Block   TUBAL LIGATION      REVIEW OF SYSTEMS:   Review of Systems  Constitutional: Positive for stable fatigue. Negative for appetite change, chills,  fever and unexpected weight change.  HENT: Negative for mouth sores, nosebleeds, sore throat and trouble swallowing.   Eyes: Negative for eye problems and icterus.  Respiratory: Negative for cough, hemoptysis, shortness of breath and wheezing.   Cardiovascular: Negative for chest pain and leg swelling.  Gastrointestinal: Improved LLQ pain. Negative for constipation, diarrhea, nausea and vomiting.  Genitourinary: Negative for bladder incontinence, difficulty urinating, dysuria, frequency and hematuria.   Musculoskeletal: Negative for back pain, gait problem, neck pain and neck stiffness.  Skin: Negative for itching and rash.  Neurological: Negative for dizziness, extremity weakness, gait problem, headaches, light-headedness and seizures.  Hematological: Negative for adenopathy. Does not bruise/bleed easily.  Psychiatric/Behavioral: Negative for confusion, depression and sleep disturbance. The patient is not nervous/anxious.     PHYSICAL  EXAMINATION:  Blood pressure 138/72, pulse 70, temperature 97.9 F (36.6 C), temperature source Temporal, resp. rate 14, weight 137 lb 1.6 oz (62.2 kg), SpO2 99%.  ECOG PERFORMANCE STATUS: 1  Physical Exam  Head: Normocephalic and atraumatic. Mouth/Throat: Oropharynx is clear and moist. No oropharyngeal exudate. No thrush.  Eyes: Conjunctivae  are normal. Right eye exhibits no discharge. Left eye exhibits no discharge. No scleral icterus. Neck: Normal range of motion. Neck supple. Cardiovascular: Normal rate, regular rhythm, systolic murmur noted and intact distal pulses.   Abdominal: Soft. Bowel sounds are normal. Exhibits no distension and no mass. There is no tenderness.  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 No weakness in hands. No decreased sensation. No swelling or erythema. Normal range of motion. Exhibits no edema.  Lymphadenopathy:    No cervical adenopathy.  Neurological: Alert and oriented to person, place, and time. Exhibits normal muscle tone. Gait normal. Coordination normal.  Skin: Skin is warm and dry. Positive for skin darkening around heel. No swelling, tenderness, or erythema. Not diaphoretic. No erythema. No pallor.  Psychiatric: Mood, memory and judgment normal.  Vitals reviewed.  LABORATORY DATA: Lab Results  Component Value Date   WBC 6.8 06/14/2023   HGB 10.8 (L) 06/14/2023   HCT 34.8 (L) 06/14/2023   MCV 84.3 06/14/2023   PLT 203 06/14/2023      Chemistry      Component Value Date/Time   NA 141 06/14/2023 1041   K 3.6 06/14/2023 1041   CL 105 06/14/2023 1041   CO2 32 06/14/2023 1041   BUN 11 06/14/2023 1041   CREATININE 0.71 06/14/2023 1041   CREATININE 0.72 06/09/2023 1432   CREATININE 0.79 03/14/2020 1016      Component Value Date/Time   CALCIUM 9.5 06/14/2023 1041   ALKPHOS 55 06/14/2023 1041   AST 29 06/14/2023 1041   AST 41 06/09/2023 1432   ALT 20 06/14/2023 1041   ALT 20 06/09/2023 1432   BILITOT 0.3 06/14/2023 1041    BILITOT 0.3 06/09/2023 1432       RADIOGRAPHIC STUDIES:  CT ABDOMEN PELVIS W CONTRAST Result Date: 06/14/2023 CLINICAL DATA:  Left lower quadrant pain for 4 days. Blood in stool. EXAM: CT ABDOMEN AND PELVIS WITH CONTRAST TECHNIQUE: Multidetector CT imaging of the abdomen and pelvis was performed using the standard protocol following bolus administration of intravenous contrast. RADIATION DOSE REDUCTION: This exam was performed according to the departmental dose-optimization program which includes automated exposure control, adjustment of the mA and/or kV according to patient size and/or use of iterative reconstruction technique. CONTRAST:  80mL OMNIPAQUE IOHEXOL 300 MG/ML  SOLN COMPARISON:  06/09/2023 FINDINGS: Lower Chest: No acute findings. Hepatobiliary: No suspicious hepatic masses identified. Several small gallstones again noted, without signs of cholecystitis or biliary ductal dilatation. Pancreas:  No mass or inflammatory changes. Spleen: Within normal limits in size and appearance. Adrenals/Urinary Tract: No suspicious masses identified. No evidence of ureteral calculi or hydronephrosis. Mild diffuse bladder wall thickening is unchanged, consistent with chronic cystitis Stomach/Bowel: Normal appendix visualized. Mild-to-moderate wall thickening is seen involving the descending colon, consistent with colitis. No evidence of abscess or bowel obstruction. Mild diverticulosis disease is seen involving the left colon. Vascular/Lymphatic: No pathologically enlarged lymph nodes. No acute vascular findings. Reproductive: Prior hysterectomy noted. Adnexal regions are unremarkable in appearance. Other:  None. Musculoskeletal:  No suspicious bone lesions identified. IMPRESSION: Mild-to-moderate colitis involving the descending colon. Mild colonic diverticulosis. Cholelithiasis. No radiographic evidence of cholecystitis. Stable chronic cystitis.  Suggest correlation with urinalysis. Electronically Signed   By:  Danae Orleans M.D.   On: 06/14/2023 15:56   CT CHEST ABDOMEN PELVIS W CONTRAST Result Date: 06/11/2023 CLINICAL DATA:  Non-small cell lung cancer restaging * Tracking Code: BO * EXAM: CT CHEST, ABDOMEN, AND PELVIS WITH CONTRAST TECHNIQUE: Multidetector CT imaging of the  chest, abdomen and pelvis was performed following the standard protocol during bolus administration of intravenous contrast. RADIATION DOSE REDUCTION: This exam was performed according to the departmental dose-optimization program which includes automated exposure control, adjustment of the mA and/or kV according to patient size and/or use of iterative reconstruction technique. CONTRAST:  OMNIPAQUE IOHEXOL 300 MG/ML  SOLN COMPARISON:  03/13/2023 FINDINGS: CT CHEST FINDINGS Cardiovascular: Right chest port catheter. Aortic atherosclerosis. Aortic valve calcifications. Normal heart size. Left coronary artery calcifications no pericardial effusion. Mediastinum/Nodes: No enlarged mediastinal, hilar, or axillary lymph nodes. Thyroid gland, trachea, and esophagus demonstrate no significant findings. Lungs/Pleura: Status post right upper lobectomy. Unchanged right middle lobe wedge resection with somewhat nodular scarring in the lateral segment (series 4, image 50). Unchanged 0.3 cm nodule of the dependent left lower lobe (series 4, image 72). No pleural effusion or pneumothorax. Musculoskeletal: No chest wall abnormality. No acute osseous findings. CT ABDOMEN PELVIS FINDINGS Hepatobiliary: No solid liver abnormality is seen. Rim calcified gallstone. Gallbladder wall thickening, or biliary dilatation. Pancreas: Unremarkable. No pancreatic ductal dilatation or surrounding inflammatory changes. Spleen: Normal in size without significant abnormality. Adrenals/Urinary Tract: Unchanged small nodule of the lateral limb of the left adrenal gland measuring 0.9 x 0.6 cm (series 2, image 66). Normal right adrenal. Kidneys are normal, without renal calculi,  solid lesion, or hydronephrosis. Mild, diffuse urinary bladder wall thickening and mucosal hyperenhancement (series 6, image 101) Stomach/Bowel: Stomach is within normal limits. Appendix appears normal. No evidence of bowel wall thickening, distention, or inflammatory changes. Descending and sigmoid diverticulosis. Vascular/Lymphatic: Severe aortic atherosclerosis. No enlarged abdominal or pelvic lymph nodes. Reproductive: Status post hysterectomy. Other: No abdominal wall hernia or abnormality. No ascites. Musculoskeletal: No acute osseous findings. IMPRESSION: 1. Status post right upper lobectomy. 2. Unchanged right middle lobe wedge resection with somewhat nodular scarring in the lateral segment. Attention on follow-up. 3. Unchanged 0.3 cm nodule of the dependent left lower lobe, nonspecific but likely benign and incidental. Attention on follow-up. 4. No evidence of lymphadenopathy or metastatic disease in the chest, abdomen, or pelvis. 5. Unchanged small nodule of the lateral limb of the left adrenal gland, nonspecific but most likely a benign incidental adenoma. Attention on follow-up. 6. Mild, diffuse urinary bladder wall thickening and mucosal hyperenhancement, suggestive of cystitis. Correlate with urinalysis. 7. Cholelithiasis. 8. Coronary artery disease. Aortic Atherosclerosis (ICD10-I70.0). Electronically Signed   By: Jearld Lesch M.D.   On: 06/11/2023 16:43     ASSESSMENT/PLAN:  This is a very pleasant 72 year old African-American female diagnosed with stage IV (T3, N0, M1 B) non-small cell lung cancer, adenocarcinoma.  She presented with a right upper lobe lung mass with a solitary brain metastasis.  She was diagnosed in February 2022.  Her PD-L1 expression is 90%.  She does not have any actionable mutations.   The patient completed SRS followed by craniotomy and resection on 08/23/2020 under the care of Dr. [name] and craniotomy under the care of Dr. Maurice Small on 08/24/2020.  She then had a  robot-assisted right upper lobectomy with en bloc wedge resection of the right middle lobe and lymph node dissection under the care of Dr. Dorris Fetch on September 24, 2020.   She underwent SRS to the two new subcentimeter brain metastases on 11/29/20.  She then underwent treatment with systemic chemotherapy with carboplatin for an AUC of 5, Alimta 500 mg per metered squared, Keytruda 200 mg IV every 3 weeks. She is status post 35 cycles (2 years of treatment). Starting from cycle #5, the  patient was on maintenance Alimta and Keytruda.   She recently had progessive metastatic disease to the brain and is status post SRS which was completed on 11/18/21.  She also had the LITT brain procedure Dr. Shaune Pollack at [facility] in April 2023    The patient was seen with Dr. Arbutus Ped today.  Dr. Arbutus Ped personally and independently reviewed the scan and discussed results with the patient today.  The scan showed no evidence of disease progression.  Dr. Arbutus Ped recommends she continue on observation with a restaging CT scan in 3 months.   She will come in to have her port-a-cath flushed every 6-8 weeks.   She will continue to follow closely with neuro-oncology for close monitoring with her history of metastatic disease to the brain. Her next follow up in is February 2025.   She will follow with GI for her recent colitis. She is on Augmentin.   The patient was advised to call immediately if she has any concerning symptoms in the interval. The patient voices understanding of current disease status and treatment options and is in agreement with the current care plan. All questions were answered. The patient knows to call the clinic with any problems, questions or concerns. We can certainly see the patient much sooner if necessary     Orders Placed This Encounter  Procedures   CT CHEST ABDOMEN PELVIS W CONTRAST    Standing Status:   Future    Expected Date:   09/21/2023    Expiration Date:   06/22/2024    If indicated for  the ordered procedure, I authorize the administration of contrast media per Radiology protocol:   Yes    Does the patient have a contrast media/X-ray dye allergy?:   No    Preferred imaging location?:   [facility]    If indicated for the ordered procedure, I authorize the administration of oral contrast media per Radiology protocol:   Yes   CBC with Differential (Cancer Center Only)    Standing Status:   Future    Expected Date:   09/21/2023    Expiration Date:   06/22/2024   CMP (Cancer Center only)    Standing Status:   Future    Expected Date:   09/21/2023    Expiration Date:   06/22/2024     Johnette Abraham Netra Postlethwait, PA-C 06/23/23 ADDENDUM: Hematology/Oncology Attending: I had a face-to-face encounter with the patient today.  I reviewed her records, lab, scan and recommended her care plan.  This is a very pleasant 72 years old African-American female with a stage IV non-small cell lung cancer, adenocarcinoma diagnosed in February 2022 with no actionable mutations and PD-L1 expression of 50%.  The patient status post SRS to brain metastasis.  She underwent systemic chemotherapy with carboplatin, Alimta and Keytruda for 4 cycles followed by maintenance treatment with Alimta and Keytruda for a total of 2 years.  She has been on observation for more than 6 months.  The patient is here today accompanied by her daughter with repeat CT scan of the chest, abdomen and pelvis for restaging of her disease. I personally and independently reviewed the scan and discussed the result with the patient today. Her scan showed no concerning findings for disease progression. I recommended for her to continue on observation with repeat CT scan of the chest, abdomen and pelvis in 3 months. She was advised to call immediately if she has any other concerning symptoms in the interval. The total time spent in the  appointment was 30 minutes. Disclaimer: This note was dictated with voice recognition  software. Similar sounding words can inadvertently be transcribed and may be missed upon review. Lajuana Matte, Hannah [name]

## 2023-06-17 NOTE — Progress Notes (Signed)
I, Stevenson Clinch, CMA acting as a scribe for Clementeen Graham, MD.  Hannah [name] is a 72 y.o. female who presents to [facility] today for exacerbation of her bilat shoulder pain. Pt was last seen by Dr. Denyse Amass on 10/22/22 bilat subacromial steroid injections and she declined a referral to PT. Last bilat GH steroid injections were on 09/16/22.  Today, pt report worsening pain after stopping oral steroid about 1 month ago. C/o radiating pain into the arms, n/t in the hands, and decreased ROM. Denies weakness or decreased grip strength. Has tried tylenol with minimal relief. Denies neck pain.   She had a glenohumeral injection in March and then a month later in April had subacromial injections.  Since then she had a brain metastasis from her cancer and had been on dexamethasone until recently.  While on dexamethasone her shoulder pain was a lot better.  Off of the dexamethasone now her shoulder pain has returned.  Dx imaging: 12/11/21 R & L shoulder XR    Pertinent review of systems: No fevers or chills  Relevant historical information: Lung cancer with metastasis to the brain   Exam:  BP 126/80   Pulse 77   Ht 5' (1.524 m)   Wt 132 lb (59.9 kg)   SpO2 97%   BMI 25.78 kg/m  General: Well Developed, well nourished, and in no acute distress.   MSK: Shoulders bilaterally normal appearing decreased abduction range of motion.    Lab and Radiology Results  Procedure: Real-time Ultrasound Guided Injection of left shoulder glenohumeral joint posterior approach Device: Philips Affiniti 50G/GE Logiq Images permanently stored and available for review in PACS Verbal informed consent obtained.  Discussed risks and benefits of procedure. Warned about infection, bleeding, hyperglycemia damage to structures among others. Patient expresses understanding and agreement Time-out conducted.   Noted no overlying erythema, induration, or other signs of local infection.    Skin prepped in a sterile fashion.   Local anesthesia: Topical Ethyl chloride.   With sterile technique and under real time ultrasound guidance: 40 mg of Kenalog and 2 mL of Marcaine injected into glenohumeral joint. Fluid seen entering the joint capsule.   Completed without difficulty   Pain immediately resolved suggesting accurate placement of the medication.   Advised to call if fevers/chills, erythema, induration, drainage, or persistent bleeding.   Images permanently stored and available for review in the ultrasound unit.  Impression: Technically successful ultrasound guided injection.    Procedure: Real-time Ultrasound Guided Injection of right shoulder glenohumeral joint posterior approach Device: Philips Affiniti 50G/GE Logiq Images permanently stored and available for review in PACS Verbal informed consent obtained.  Discussed risks and benefits of procedure. Warned about infection, bleeding, hyperglycemia damage to structures among others. Patient expresses understanding and agreement Time-out conducted.   Noted no overlying erythema, induration, or other signs of local infection.   Skin prepped in a sterile fashion.   Local anesthesia: Topical Ethyl chloride.   With sterile technique and under real time ultrasound guidance: 40 mg of Kenalog and 2 mL of Marcaine injected into glenohumeral joint. Fluid seen entering the joint capsule.   Completed without difficulty   Pain immediately resolved suggesting accurate placement of the medication.   Advised to call if fevers/chills, erythema, induration, drainage, or persistent bleeding.   Images permanently stored and available for review in the ultrasound unit.  Impression: Technically successful ultrasound guided injection.        Assessment and Plan: 72 y.o.  female with bilateral shoulder pain.  Pain multifactorial.  This is a chronic problem with an acute recurrence.  Plan today for bilateral glenohumeral joint  injections.  If this is not effective enough next step could be a subacromial injection bilaterally as soon as a week or 2 from now.  Her pain got worse because she stopped the dexamethasone.  Hopefully steroid injection will provide some pain relief with less systemic symptoms.  Recheck back as needed   PDMP not reviewed this encounter. Orders Placed This Encounter  Procedures   [place] LIMITED JOINT SPACE STRUCTURES UP BILAT(NO LINKED CHARGES)    Reason for Exam (SYMPTOM  OR DIAGNOSIS REQUIRED):   bilat shoulder pain    Preferred imaging location?:   [facility]   No orders of the defined types were placed in this encounter.    Discussed warning signs or symptoms. Please see discharge instructions. Patient expresses understanding.   The above documentation has been reviewed and is accurate and complete Clementeen Graham, M.D.

## 2023-06-17 NOTE — Patient Instructions (Signed)
Thank you for coming in today.   You received an injection today. Seek immediate medical attention if the joint becomes red, extremely painful, or is oozing fluid.   Check back as needed  If needed, we can do an injection in a different location after the holidays

## 2023-06-17 NOTE — Telephone Encounter (Signed)
[place], [place]  Avanell Shackleton, RN If patient is having diarrhea with this colitis then she can certainly hold the Linzess for now or use every other day.  After she finishes antibiotics for the colitis she can go back on the Linzess as well as working well for her.  Thanks, JL L Daughter states the patient did not have diarrhea and was having abdominal pain prior to going to the ED. Informed the daughter after the antibiotics are complete, she can go back to taking the Linzess. Also stressed if the patient has diarrhea to stop the Linzess or decrease to every other day. Daughter states she was concerned if the medication could have caused the colitis?

## 2023-06-18 ENCOUNTER — Ambulatory Visit: Payer: Medicare HMO | Admitting: Physician Assistant

## 2023-06-18 NOTE — Telephone Encounter (Signed)
Called the patient's daughter to inform her the medication, could not have caused the patient's colitis. Daughter understood.

## 2023-06-23 ENCOUNTER — Inpatient Hospital Stay (HOSPITAL_BASED_OUTPATIENT_CLINIC_OR_DEPARTMENT_OTHER): Payer: Medicare HMO | Admitting: Physician Assistant

## 2023-06-23 VITALS — BP 138/72 | HR 70 | Temp 97.9°F | Resp 14 | Wt 137.1 lb

## 2023-06-23 DIAGNOSIS — K219 Gastro-esophageal reflux disease without esophagitis: Secondary | ICD-10-CM | POA: Diagnosis not present

## 2023-06-23 DIAGNOSIS — I700 Atherosclerosis of aorta: Secondary | ICD-10-CM | POA: Diagnosis not present

## 2023-06-23 DIAGNOSIS — K529 Noninfective gastroenteritis and colitis, unspecified: Secondary | ICD-10-CM | POA: Diagnosis not present

## 2023-06-23 DIAGNOSIS — C3490 Malignant neoplasm of unspecified part of unspecified bronchus or lung: Secondary | ICD-10-CM | POA: Diagnosis not present

## 2023-06-23 DIAGNOSIS — I2510 Atherosclerotic heart disease of native coronary artery without angina pectoris: Secondary | ICD-10-CM | POA: Diagnosis not present

## 2023-06-23 DIAGNOSIS — C3411 Malignant neoplasm of upper lobe, right bronchus or lung: Secondary | ICD-10-CM | POA: Diagnosis not present

## 2023-06-23 DIAGNOSIS — I739 Peripheral vascular disease, unspecified: Secondary | ICD-10-CM | POA: Diagnosis not present

## 2023-06-23 DIAGNOSIS — C7931 Secondary malignant neoplasm of brain: Secondary | ICD-10-CM | POA: Diagnosis not present

## 2023-06-23 DIAGNOSIS — K5730 Diverticulosis of large intestine without perforation or abscess without bleeding: Secondary | ICD-10-CM | POA: Diagnosis not present

## 2023-06-23 DIAGNOSIS — J984 Other disorders of lung: Secondary | ICD-10-CM | POA: Diagnosis not present

## 2023-07-06 ENCOUNTER — Encounter: Payer: Self-pay | Admitting: *Deleted

## 2023-07-09 ENCOUNTER — Encounter: Payer: Self-pay | Admitting: Internal Medicine

## 2023-07-15 ENCOUNTER — Other Ambulatory Visit: Payer: Self-pay | Admitting: Internal Medicine

## 2023-07-16 ENCOUNTER — Inpatient Hospital Stay: Payer: PPO | Attending: Internal Medicine

## 2023-07-16 ENCOUNTER — Encounter: Payer: Self-pay | Admitting: Internal Medicine

## 2023-07-16 DIAGNOSIS — Z452 Encounter for adjustment and management of vascular access device: Secondary | ICD-10-CM | POA: Insufficient documentation

## 2023-07-16 DIAGNOSIS — C3491 Malignant neoplasm of unspecified part of right bronchus or lung: Secondary | ICD-10-CM

## 2023-07-16 DIAGNOSIS — C3411 Malignant neoplasm of upper lobe, right bronchus or lung: Secondary | ICD-10-CM | POA: Diagnosis not present

## 2023-07-16 DIAGNOSIS — Z95828 Presence of other vascular implants and grafts: Secondary | ICD-10-CM

## 2023-07-16 DIAGNOSIS — C3490 Malignant neoplasm of unspecified part of unspecified bronchus or lung: Secondary | ICD-10-CM

## 2023-07-16 MED ORDER — HEPARIN SOD (PORK) LOCK FLUSH 100 UNIT/ML IV SOLN
500.0000 [IU] | Freq: Once | INTRAVENOUS | Status: AC
  Administered 2023-07-16: 500 [IU]

## 2023-07-16 MED ORDER — SODIUM CHLORIDE 0.9% FLUSH
10.0000 mL | Freq: Once | INTRAVENOUS | Status: AC
Start: 2023-07-16 — End: 2023-07-16
  Administered 2023-07-16: 10 mL

## 2023-07-22 DIAGNOSIS — Z961 Presence of intraocular lens: Secondary | ICD-10-CM | POA: Diagnosis not present

## 2023-07-22 DIAGNOSIS — H35371 Puckering of macula, right eye: Secondary | ICD-10-CM | POA: Diagnosis not present

## 2023-07-27 ENCOUNTER — Encounter: Payer: Self-pay | Admitting: Internal Medicine

## 2023-07-27 ENCOUNTER — Ambulatory Visit (INDEPENDENT_AMBULATORY_CARE_PROVIDER_SITE_OTHER): Payer: PPO | Admitting: Internal Medicine

## 2023-07-27 VITALS — BP 130/62 | HR 55 | Temp 98.0°F | Ht 60.0 in | Wt 137.0 lb

## 2023-07-27 DIAGNOSIS — R000 Tachycardia, unspecified: Secondary | ICD-10-CM | POA: Insufficient documentation

## 2023-07-27 DIAGNOSIS — R350 Frequency of micturition: Secondary | ICD-10-CM

## 2023-07-27 DIAGNOSIS — F419 Anxiety disorder, unspecified: Secondary | ICD-10-CM

## 2023-07-27 DIAGNOSIS — F3289 Other specified depressive episodes: Secondary | ICD-10-CM

## 2023-07-27 LAB — COMPREHENSIVE METABOLIC PANEL
ALT: 12 U/L (ref 0–35)
AST: 21 U/L (ref 0–37)
Albumin: 4 g/dL (ref 3.5–5.2)
Alkaline Phosphatase: 75 U/L (ref 39–117)
BUN: 8 mg/dL (ref 6–23)
CO2: 30 meq/L (ref 19–32)
Calcium: 9.8 mg/dL (ref 8.4–10.5)
Chloride: 105 meq/L (ref 96–112)
Creatinine, Ser: 0.75 mg/dL (ref 0.40–1.20)
GFR: 78.13 mL/min (ref >60.00)
Glucose, Bld: 103 mg/dL — ABNORMAL HIGH (ref 70–99)
Potassium: 4.4 meq/L (ref 3.5–5.1)
Sodium: 142 meq/L (ref 135–145)
Total Bilirubin: 0.3 mg/dL (ref 0.2–1.2)
Total Protein: 6.8 g/dL (ref 6.0–8.3)

## 2023-07-27 LAB — CBC WITH DIFFERENTIAL/PLATELET
Basophils Absolute: 0 10*3/uL (ref 0.0–0.1)
Basophils Relative: 0.8 % (ref 0.0–3.0)
Eosinophils Absolute: 0.1 10*3/uL (ref 0.0–0.7)
Eosinophils Relative: 1.6 % (ref 0.0–5.0)
HCT: 36.3 % (ref 36.0–46.0)
Hemoglobin: 11.7 g/dL — ABNORMAL LOW (ref 12.0–15.0)
Lymphocytes Relative: 38.2 % (ref 12.0–46.0)
Lymphs Abs: 1.8 10*3/uL (ref 0.7–4.0)
MCHC: 32.1 g/dL (ref 30.0–36.0)
MCV: 82.8 fL (ref 78.0–100.0)
Monocytes Absolute: 0.5 10*3/uL (ref 0.1–1.0)
Monocytes Relative: 10.5 % (ref 3.0–12.0)
Neutro Abs: 2.4 10*3/uL (ref 1.4–7.7)
Neutrophils Relative %: 48.9 % (ref 43.0–77.0)
Platelets: 224 10*3/uL (ref 150.0–400.0)
RBC: 4.38 Mil/uL (ref 3.87–5.11)
RDW: 14 % (ref 11.5–15.5)
WBC: 4.8 10*3/uL (ref 4.0–10.5)

## 2023-07-27 LAB — URINALYSIS, ROUTINE W REFLEX MICROSCOPIC
Bilirubin Urine: NEGATIVE
Hgb urine dipstick: NEGATIVE
Ketones, ur: NEGATIVE
Leukocytes,Ua: NEGATIVE
Nitrite: NEGATIVE
RBC / HPF: NONE SEEN (ref 0–?)
Specific Gravity, Urine: <=1.005 — AB (ref 1.000–1.030)
Total Protein, Urine: NEGATIVE
Urine Glucose: NEGATIVE
Urobilinogen, UA: 0.2 (ref 0.0–1.0)
pH: 6.5 (ref 5.0–8.0)

## 2023-07-27 LAB — TSH: TSH: 0.79 u[IU]/mL (ref 0.35–5.50)

## 2023-07-27 MED ORDER — MIRTAZAPINE 15 MG PO TABS: 15.0000 mg | ORAL_TABLET | Freq: Every day | ORAL | 1 refills | Status: DC

## 2023-07-27 MED ORDER — HYDROCORTISONE ACETATE 25 MG RE SUPP: 25.0000 mg | Freq: Two times a day (BID) | RECTAL | 0 refills | Status: DC

## 2023-07-27 NOTE — Assessment & Plan Note (Signed)
Acute on chronic Anxiety is increased recently which is likely multifactorial We decided not to change her medication Continue mirtazapine 7.5 mg nightly-only takes half a pill Continue extended release alprazolam and quick acting alprazolam She will try to get back to her regular exercise Discussed journaling Discussed breathing exercises throughout the day

## 2023-07-27 NOTE — Assessment & Plan Note (Signed)
Chronic Does have a history of UTIs and given her current symptoms we will check UA, urine culture to rule out infection that could be contributing to her symptoms

## 2023-07-27 NOTE — Assessment & Plan Note (Signed)
Chronic She does have some depression and increased anxiety Some of this is related to being lonely, not being able to drive and since her current medical problems Her daughter feels her current dose of medication is good and she agrees Continue mirtazapine 7.5 mg nightly, citalopram 10 mg daily Discussed that we can increase the citalopram slightly if needed in the future

## 2023-07-27 NOTE — Progress Notes (Signed)
Subjective:    Patient ID: Hannah [name], female    DOB: 02/12/1951, 73 y.o.   MRN: [id number]      HPI Evelen is here for  Chief Complaint  Patient presents with   Palpitations  She is here today with her daughter.   Rapid heartbeat-  has feeling of heart racing daily - some days are worse than others.  It is intermittent. It is more in the morning.  Feels better after she eats.    Has increased anxiety -   she has chronic anxiety, but her daughter states her anxiety has been increased recently.  Last month she had an episode of colitis and she is experiencing constipation which is making her a little anxious.  Her son was also in an accident last month and that may have contributed to some to the anxiety.  She is not exercising regularly and is concerned about her weight which may also be contributing.   Not eating as much.  Not hungry since stopping the steroids.  Not happy with her weight.  Worried about getting impacted.  Strength and constipation.    Medications and allergies reviewed with patient and updated if appropriate.  Current Outpatient Medications on File Prior to Visit  Medication Sig Dispense Refill   acetaminophen (TYLENOL) 325 MG tablet Take 325 mg by mouth every 6 (six) hours as needed for moderate pain.     ALPRAZolam (XANAX XR) 0.5 MG 24 hr tablet Take 1 tablet (0.5 mg total) by mouth every morning. 30 tablet 5   ALPRAZolam (XANAX) 0.5 MG tablet Take 1 tablet (0.5 mg total) by mouth 3 (three) times daily as needed. for anxiety 90 tablet 5   aspirin 81 MG tablet Take 1 tablet (81 mg total) by mouth daily. Restart on 08/31/20 30 tablet    bisacodyl (DULCOLAX) 5 MG EC tablet Take 5 mg by mouth daily as needed for moderate constipation.     carboxymethylcellulose (REFRESH PLUS) 0.5 % SOLN Place 1-2 drops into both eyes 3 (three) times daily as needed (dry eyes).     Cholecalciferol (D3 PO) Take 1 capsule by mouth daily.     citalopram (CELEXA) 10 MG tablet Take  1 tablet (10 mg total) by mouth daily. 90 tablet 1   dexamethasone (DECADRON) 2 MG tablet Take 1 tablet (2 mg total) by mouth daily. 30 tablet 0   fluticasone (FLONASE) 50 MCG/ACT nasal spray USE 2 SPRAYS IN EACH NOSTRIL EVERY DAY 48 g 3   folic acid (FOLVITE) 1 MG tablet Take 1 tablet by mouth once daily 30 tablet 0   HYDROcodone-acetaminophen (HYCET) 7.5-325 mg/15 ml solution 15 ml every 6 hours as needed for moderate to severe pain 150 mL 0   ketoconazole (NIZORAL) 2 % cream APPLY 1 APPLICATION TOPICALLY DAILY 30 g 0   Lacosamide 100 MG TABS Take 1 tablet (100 mg total) by mouth 2 (two) times daily. 60 tablet 2   levETIRAcetam (KEPPRA) 100 MG/ML solution TAKE 15 ML BY MOUTH  TWICE DAILY 473 mL 2   lidocaine-prilocaine (EMLA) cream Apply 1 Application topically as needed. 30 g 2   linaclotide (LINZESS) 72 MCG capsule Take 1 capsule (72 mcg total) by mouth daily before breakfast. Take 30 minutes prior to breakfast 30 capsule 5   loratadine (CLARITIN) 10 MG tablet Take 10 mg by mouth daily as needed for allergies.     magnesium hydroxide (MILK OF MAGNESIA) 800 MG/5ML suspension Take 30 mLs by mouth  daily as needed for constipation.     Menthol, Topical Analgesic, (BIOFREEZE EX) Apply 1 application topically as needed (pain).     olopatadine (PATANOL) 0.1 % ophthalmic solution Place 1 drop into both eyes 2 (two) times daily as needed for allergies.     omeprazole (PRILOSEC) 40 MG capsule Take 1 capsule (40 mg total) by mouth daily. 90 capsule 1   polyethylene glycol (MIRALAX / GLYCOLAX) 17 g packet Take 17 g by mouth 2 (two) times daily. 72 each 0   pravastatin (PRAVACHOL) 80 MG tablet Take 1 tablet (80 mg total) by mouth every evening. 90 tablet 3   Probiotic Product (PROBIOTIC DAILY PO) Take 1 capsule by mouth daily.     prochlorperazine (COMPAZINE) 10 MG tablet TAKE 1 TABLET BY MOUTH EVERY 6 HOURS AS NEEDED FOR NAUSEA OR VOMITING 30 tablet 0   simethicone (MYLICON) 80 MG chewable tablet Chew 1  tablet (80 mg total) by mouth 4 (four) times daily as needed for flatulence (Bloating). 30 tablet 0   No current facility-administered medications on file prior to visit.    Review of Systems  Constitutional:  Positive for appetite change (dec). Negative for fever.  Respiratory:  Negative for cough, shortness of breath and wheezing.   Cardiovascular:  Positive for palpitations (fast heart rate). Negative for chest pain and leg swelling.  Gastrointestinal:  Positive for abdominal distention (bloating - discomfort), anal bleeding and constipation. Negative for abdominal pain.  Genitourinary:  Positive for frequency. Negative for dysuria.  Neurological:  Positive for dizziness (yesterday). Negative for light-headedness and headaches.  Psychiatric/Behavioral:  The patient is nervous/anxious.        Objective:   Vitals:   07/27/23 0806  BP: 130/62  Pulse: (!) 55  Temp: 98 F (36.7 C)   BP Readings from Last 3 Encounters:  07/27/23 130/62  06/23/23 138/72  06/17/23 126/80   Wt Readings from Last 3 Encounters:  07/27/23 137 lb (62.1 kg)  06/23/23 137 lb 1.6 oz (62.2 kg)  06/17/23 132 lb (59.9 kg)   Body mass index is 26.76 kg/m.    Physical Exam Constitutional:      General: She is not in acute distress.    Appearance: Normal appearance.  HENT:     Head: Normocephalic and atraumatic.  Eyes:     Conjunctiva/sclera: Conjunctivae normal.  Cardiovascular:     Rate and Rhythm: Normal rate and regular rhythm.     Heart sounds: Normal heart sounds.  Pulmonary:     Effort: Pulmonary effort is normal. No respiratory distress.     Breath sounds: Normal breath sounds. No wheezing.  Abdominal:     General: There is no distension.     Palpations: Abdomen is soft.     Tenderness: There is abdominal tenderness (Mild diffuse tenderness). There is no guarding or rebound.  Musculoskeletal:     Cervical back: Neck supple.     Right lower leg: No edema.     Left lower leg: No edema.   Lymphadenopathy:     Cervical: No cervical adenopathy.  Skin:    General: Skin is warm and dry.     Findings: No rash.  Neurological:     Mental Status: She is alert. Mental status is at baseline.  Psychiatric:        Mood and Affect: Mood normal.        Behavior: Behavior normal.            Assessment & Plan:  See Problem List for Assessment and Plan of chronic medical problems.

## 2023-07-27 NOTE — Patient Instructions (Addendum)
      Blood work was ordered.       Medications changes include :   suppositories      Return for follow up as scheduled.

## 2023-07-27 NOTE — Assessment & Plan Note (Signed)
New Has been experiencing intermittent fast heart rate-mostly in the morning Possibly related to anxiety Will check CBC, CMP, TSH EKG today: NSR at 65 bpm, RBBB, LAFB, possible LVH.  Compared to last EKG from September 2024 there are no changes. Discussed Holter monitor to evaluate further-she deferred for now-we discussed that her symptoms continue we can consider doing a Holter at that time-they will let me know

## 2023-07-28 LAB — URINE CULTURE: Result:: NO GROWTH

## 2023-08-12 ENCOUNTER — Other Ambulatory Visit: Payer: Self-pay | Admitting: Internal Medicine

## 2023-08-12 DIAGNOSIS — C3490 Malignant neoplasm of unspecified part of unspecified bronchus or lung: Secondary | ICD-10-CM

## 2023-08-13 ENCOUNTER — Encounter: Payer: Self-pay | Admitting: Internal Medicine

## 2023-08-20 ENCOUNTER — Inpatient Hospital Stay: Admission: RE | Admit: 2023-08-20 | Payer: Medicare HMO | Source: Ambulatory Visit

## 2023-08-25 ENCOUNTER — Ambulatory Visit
Admission: RE | Admit: 2023-08-25 | Discharge: 2023-08-25 | Disposition: A | Discharge status: Home / Self Care | Payer: PPO | Source: Ambulatory Visit | Attending: Internal Medicine

## 2023-08-25 DIAGNOSIS — C3490 Malignant neoplasm of unspecified part of unspecified bronchus or lung: Secondary | ICD-10-CM

## 2023-08-25 DIAGNOSIS — C7931 Secondary malignant neoplasm of brain: Secondary | ICD-10-CM | POA: Diagnosis not present

## 2023-08-25 MED ORDER — GADOPICLENOL 0.5 MMOL/ML IV SOLN
6.5000 mL | Freq: Once | INTRAVENOUS | Status: AC | PRN
Start: 2023-08-25 — End: 2023-08-25
  Administered 2023-08-25: 6.5 mL via INTRAVENOUS

## 2023-08-27 ENCOUNTER — Inpatient Hospital Stay: Payer: PPO | Admitting: Internal Medicine

## 2023-08-27 ENCOUNTER — Inpatient Hospital Stay: Payer: PPO | Attending: Internal Medicine

## 2023-08-27 DIAGNOSIS — Z8379 Family history of other diseases of the digestive system: Secondary | ICD-10-CM | POA: Insufficient documentation

## 2023-08-27 DIAGNOSIS — C7931 Secondary malignant neoplasm of brain: Secondary | ICD-10-CM | POA: Insufficient documentation

## 2023-08-27 DIAGNOSIS — D496 Neoplasm of unspecified behavior of brain: Secondary | ICD-10-CM

## 2023-08-27 DIAGNOSIS — Z803 Family history of malignant neoplasm of breast: Secondary | ICD-10-CM | POA: Insufficient documentation

## 2023-08-27 DIAGNOSIS — Z7952 Long term (current) use of systemic steroids: Secondary | ICD-10-CM | POA: Insufficient documentation

## 2023-08-27 DIAGNOSIS — R609 Edema, unspecified: Secondary | ICD-10-CM | POA: Diagnosis not present

## 2023-08-27 DIAGNOSIS — Z79899 Other long term (current) drug therapy: Secondary | ICD-10-CM | POA: Diagnosis not present

## 2023-08-27 DIAGNOSIS — C3491 Malignant neoplasm of unspecified part of right bronchus or lung: Secondary | ICD-10-CM

## 2023-08-27 DIAGNOSIS — Z823 Family history of stroke: Secondary | ICD-10-CM | POA: Insufficient documentation

## 2023-08-27 DIAGNOSIS — Z888 Allergy status to other drugs, medicaments and biological substances status: Secondary | ICD-10-CM | POA: Diagnosis not present

## 2023-08-27 DIAGNOSIS — Z809 Family history of malignant neoplasm, unspecified: Secondary | ICD-10-CM | POA: Insufficient documentation

## 2023-08-27 DIAGNOSIS — Z95828 Presence of other vascular implants and grafts: Secondary | ICD-10-CM

## 2023-08-27 DIAGNOSIS — F32A Depression, unspecified: Secondary | ICD-10-CM | POA: Insufficient documentation

## 2023-08-27 DIAGNOSIS — Z833 Family history of diabetes mellitus: Secondary | ICD-10-CM | POA: Insufficient documentation

## 2023-08-27 DIAGNOSIS — K219 Gastro-esophageal reflux disease without esophagitis: Secondary | ICD-10-CM | POA: Diagnosis not present

## 2023-08-27 DIAGNOSIS — Z87891 Personal history of nicotine dependence: Secondary | ICD-10-CM | POA: Diagnosis not present

## 2023-08-27 DIAGNOSIS — F419 Anxiety disorder, unspecified: Secondary | ICD-10-CM | POA: Diagnosis not present

## 2023-08-27 DIAGNOSIS — Z8719 Personal history of other diseases of the digestive system: Secondary | ICD-10-CM | POA: Insufficient documentation

## 2023-08-27 DIAGNOSIS — C3490 Malignant neoplasm of unspecified part of unspecified bronchus or lung: Secondary | ICD-10-CM | POA: Diagnosis not present

## 2023-08-27 DIAGNOSIS — M1990 Unspecified osteoarthritis, unspecified site: Secondary | ICD-10-CM | POA: Diagnosis not present

## 2023-08-27 DIAGNOSIS — Z8042 Family history of malignant neoplasm of prostate: Secondary | ICD-10-CM | POA: Insufficient documentation

## 2023-08-27 DIAGNOSIS — C3411 Malignant neoplasm of upper lobe, right bronchus or lung: Secondary | ICD-10-CM | POA: Insufficient documentation

## 2023-08-27 DIAGNOSIS — Z90710 Acquired absence of both cervix and uterus: Secondary | ICD-10-CM | POA: Diagnosis not present

## 2023-08-27 DIAGNOSIS — E785 Hyperlipidemia, unspecified: Secondary | ICD-10-CM | POA: Insufficient documentation

## 2023-08-27 DIAGNOSIS — I6782 Cerebral ischemia: Secondary | ICD-10-CM | POA: Insufficient documentation

## 2023-08-27 MED ORDER — HEPARIN SOD (PORK) LOCK FLUSH 100 UNIT/ML IV SOLN
500.0000 [IU] | Freq: Once | INTRAVENOUS | Status: AC
  Administered 2023-08-27: 500 [IU]

## 2023-08-27 MED ORDER — LEVETIRACETAM 100 MG/ML PO SOLN: ORAL | Status: DC

## 2023-08-27 MED ORDER — SODIUM CHLORIDE 0.9% FLUSH
10.0000 mL | Freq: Once | INTRAVENOUS | Status: AC
  Administered 2023-08-27: 10 mL

## 2023-08-27 MED ORDER — LACOSAMIDE 100 MG PO TABS: 1.0000 | ORAL_TABLET | Freq: Two times a day (BID) | ORAL | 3 refills | Status: DC

## 2023-08-27 MED ORDER — LEVETIRACETAM 100 MG/ML PO SOLN: ORAL | 2 refills | Status: DC

## 2023-08-27 NOTE — Progress Notes (Signed)
 [facility] [address]  [place], [place] [address] [phone]   Interval Evaluation  Date of Service: 08/27/23 Patient Name: Hannah [name] Patient MRN: [id number] Patient DOB: 03/02/1951 Provider: Henreitta Leber, MD  Identifying Statement:  Hannah [name] is a 73 y.o. female with brain metastasis   Primary Cancer:  Oncology History  Primary cancer of right upper lobe of lung (HCC)  08/07/2020 Initial Diagnosis   Primary cancer of right upper lobe of lung (HCC)   09/20/2020 Cancer Staging   Staging form: Lung, AJCC 8th Edition - Clinical: Stage IVB (cT3, cN0, cM1c) - Signed by Si Gaul, MD on 09/20/2020   11/08/2020 - 01/16/2022 Chemotherapy   Patient is on Treatment Plan : LUNG CARBOplatin / Pemetrexed / Pembrolizumab q21d Induction x 4 cycles / Maintenance Pemetrexed + Pembrolizumab     11/16/2020 - 11/27/2022 Chemotherapy   Patient is on Treatment Plan : LUNG Carboplatin (5) + Pemetrexed (500) + Pembrolizumab (200) D1 q21d Induction x 4 cycles / Maintenance Pemetrexed (500) + Pembrolizumab (200) D1 q21d      Oncologic History 08/23/20: Pre-op SRS to L frontal mass Hannah [name]) 08/24/20: Craniotomy, resection (Ostergard) 11/29/20: Salvage SRS x2 Hannah [name]) 10/14/21: L frontoparietal progression, undergoes LITT at [facility]) 11/18/21: Path is adenocarcinoma, completes 25/5 SRS Hannah [name])  Interval History:  Hannah [name] presents today for follow up after recent MRI brain. Denies new or progressive changes, neurologic symptoms.  She continues on keppra 500mg  BID and vimpat without further seizures.  Decadron is at 2mg  daily.  Continues on observation with Dr. Arbutus Ped.  H+P (08/02/20) Patient presented to medical attention this past week with several days rapidly progressive speech impairment and right sided weakness.  She and her family describe difficulty putting complete sentences together, impaired use of right arm (for using  fork, zipper, brushing teeth), and dragging of the right leg while walking.  She never needed assistance to walk.  CNS imaging demonstrated left frontal mass, and decadron was started over the weekend 4mg  twice per day.  She feels improved with regards to the right sided weakness with the steroids.  Otherwise no seizures, headaches.  Medications: Current Outpatient Medications on File Prior to Visit  Medication Sig Dispense Refill   acetaminophen (TYLENOL) 325 MG tablet Take 325 mg by mouth every 6 (six) hours as needed for moderate pain.     ALPRAZolam (XANAX XR) 0.5 MG 24 hr tablet Take 1 tablet (0.5 mg total) by mouth every morning. 30 tablet 5   ALPRAZolam (XANAX) 0.5 MG tablet Take 1 tablet (0.5 mg total) by mouth 3 (three) times daily as needed. for anxiety 90 tablet 5   aspirin 81 MG tablet Take 1 tablet (81 mg total) by mouth daily. Restart on 08/31/20 30 tablet    bisacodyl (DULCOLAX) 5 MG EC tablet Take 5 mg by mouth daily as needed for moderate constipation.     carboxymethylcellulose (REFRESH PLUS) 0.5 % SOLN Place 1-2 drops into both eyes 3 (three) times daily as needed (dry eyes).     Cholecalciferol (D3 PO) Take 1 capsule by mouth daily.     citalopram (CELEXA) 10 MG tablet Take 1 tablet (10 mg total) by mouth daily. 90 tablet 1   fluticasone (FLONASE) 50 MCG/ACT nasal spray USE 2 SPRAYS IN EACH NOSTRIL EVERY DAY 48 g 3   folic acid (FOLVITE) 1 MG tablet Take 1 tablet by mouth once daily 30 tablet 0   HYDROcodone-acetaminophen (HYCET)  7.5-325 mg/15 ml solution 15 ml every 6 hours as needed for moderate to severe pain 150 mL 0   hydrocortisone (ANUSOL-HC) 25 MG suppository Place 1 suppository (25 mg total) rectally 2 (two) times daily. 12 suppository 0   ketoconazole (NIZORAL) 2 % cream APPLY 1 APPLICATION TOPICALLY DAILY 30 g 0   lidocaine-prilocaine (EMLA) cream Apply 1 Application topically as needed. 30 g 2   linaclotide (LINZESS) 72 MCG capsule Take 1 capsule (72 mcg total) by  mouth daily before breakfast. Take 30 minutes prior to breakfast 30 capsule 5   loratadine (CLARITIN) 10 MG tablet Take 10 mg by mouth daily as needed for allergies.     magnesium hydroxide (MILK OF MAGNESIA) 800 MG/5ML suspension Take 30 mLs by mouth daily as needed for constipation.     Menthol, Topical Analgesic, (BIOFREEZE EX) Apply 1 application topically as needed (pain).     mirtazapine (REMERON) 15 MG tablet Take 1 tablet (15 mg total) by mouth at bedtime. 90 tablet 1   olopatadine (PATANOL) 0.1 % ophthalmic solution Place 1 drop into both eyes 2 (two) times daily as needed for allergies.     omeprazole (PRILOSEC) 40 MG capsule Take 1 capsule (40 mg total) by mouth daily. 90 capsule 1   polyethylene glycol (MIRALAX / GLYCOLAX) 17 g packet Take 17 g by mouth 2 (two) times daily. 72 each 0   pravastatin (PRAVACHOL) 80 MG tablet Take 1 tablet (80 mg total) by mouth every evening. 90 tablet 3   Probiotic Product (PROBIOTIC DAILY PO) Take 1 capsule by mouth daily.     prochlorperazine (COMPAZINE) 10 MG tablet TAKE 1 TABLET BY MOUTH EVERY 6 HOURS AS NEEDED FOR NAUSEA OR VOMITING 30 tablet 0   simethicone (MYLICON) 80 MG chewable tablet Chew 1 tablet (80 mg total) by mouth 4 (four) times daily as needed for flatulence (Bloating). 30 tablet 0   dexamethasone (DECADRON) 2 MG tablet Take 1 tablet (2 mg total) by mouth daily. (Patient not taking: Reported on 08/27/2023) 30 tablet 0   No current facility-administered medications on file prior to visit.    Allergies:  Allergies  Allergen Reactions   Amlodipine Swelling and Rash    Rash, swelling   Chantix [Varenicline Tartrate] Shortness Of Breath, Swelling and Other (See Comments)    Tongue swell,sob   Clarithromycin Rash   Lisinopril Hives   Simvastatin Hives   Wellbutrin [Bupropion Hcl] Hives   Lipitor [Atorvastatin]     Dizziness per patient   Sertraline     Makes her feel like she is going to kill someone   Past Medical History:   Past Medical History:  Diagnosis Date   Allergy    Anemia    Anxiety    Arthritis    Carpal tunnel syndrome    Constipation    senna C stool softeners help    Depression    Diverticulosis    Dyslipidemia    External hemorrhoids    GERD (gastroesophageal reflux disease)    Heart murmur    mild-moderate AR   Hiatal hernia    Hyperlipidemia    on meds    Hypertension    Internal hemorrhoids    lung ca with brain mets 07/2020   Osteoarthritis    Pre-diabetes    PVD (peripheral vascular disease) (HCC)    moderate carotid disease   RBBB    Smoker    Vocal cord polyps    Past Surgical History:  Past Surgical  History:  Procedure Laterality Date   ABDOMINAL HYSTERECTOMY  1994   APPLICATION OF CRANIAL NAVIGATION N/A 08/24/2020   Procedure: APPLICATION OF CRANIAL NAVIGATION;  Surgeon: Jadene Pierini, MD;  Location: [facility] OR;  Service: Neurosurgery;  Laterality: N/A;   COLONOSCOPY     COLONOSCOPY W/ POLYPECTOMY  2009   CRANIOTOMY Left 08/24/2020   Procedure: Left Craniotomy for Tumor Resection with Brainlab;  Surgeon: Jadene Pierini, MD;  Location: [facility] OR;  Service: Neurosurgery;  Laterality: Left;   HEMORRHOID SURGERY     INTERCOSTAL NERVE BLOCK Right 09/24/2020   Procedure: INTERCOSTAL NERVE BLOCK;  Surgeon: Loreli Slot, MD;  Location: [facility] OR;  Service: Thoracic;  Laterality: Right;   IR IMAGING GUIDED PORT INSERTION  05/15/2022   JOINT REPLACEMENT     LYMPH NODE DISSECTION Right 09/24/2020   Procedure: LYMPH NODE DISSECTION;  Surgeon: Loreli Slot, MD;  Location: [facility] OR;  Service: Thoracic;  Laterality: Right;   POLYPECTOMY     right total knee arthroplasty     Dr. Sherle Poe 06-04-18   SPINE SURGERY  08/16/2009   TOTAL KNEE ARTHROPLASTY Left 02/13/2014   Procedure: TOTAL KNEE ARTHROPLASTY;  Surgeon: Harvie Junior, MD;  Location: [facility] OR;  Service: Orthopedics;  Laterality: Left;   TOTAL KNEE ARTHROPLASTY Right 06/04/2018   Procedure: RIGHT TOTAL KNEE  ARTHROPLASTY;  Surgeon: Jodi Geralds, MD;  Location: [facility] ORS;  Service: Orthopedics;  Laterality: Right;  Adductor Block   TUBAL LIGATION     Social History:  Social History   Socioeconomic History   Marital status: Married    Spouse name: Not on file   Number of children: 2   Years of education: Not on file   Highest education level: 12th grade  Occupational History   Not on file  Tobacco Use   Smoking status: Former    Current packs/day: 0.00    Average packs/day: 0.3 packs/day for 40.0 years (10.0 ttl pk-yrs)    Types: Cigarettes    Start date: 07/09/1980    Quit date: 07/09/2020    Years since quitting: 3.1    Passive exposure: Never   Smokeless tobacco: Never   Tobacco comments:    PACK WILL LAST 3 days  Vaping Use   Vaping status: Never Used  Substance and Sexual Activity   Alcohol use: No   Drug use: No   Sexual activity: Not Currently  Other Topics Concern   Not on file  Social History Narrative   Not on file   Social Drivers of Health   Financial Resource Strain: Low Risk  (07/26/2023)   Overall Financial Resource Strain (CARDIA)    Difficulty of Paying Living Expenses: Not hard at all  Food Insecurity: No Food Insecurity (07/26/2023)   Hunger Vital Sign    Worried About [name] Out of Food in the Last Year: Never true    Ran Out of Food in the Last Year: Never true  Transportation Needs: No Transportation Needs (07/26/2023)   PRAPARE - Administrator, Civil Service (Medical): No    Lack of Transportation (Non-Medical): No  Physical Activity: Insufficiently Active (07/26/2023)   Exercise Vital Sign    Days of Exercise per Week: 1 day    Minutes of Exercise per Session: 20 min  Stress: No Stress Concern Present (07/26/2023)   [employer] of Occupational Health - Occupational Stress Questionnaire    Feeling of Stress : Only a little  Social Connections: Socially Integrated (07/26/2023)  Social Connection and Isolation Panel [NHANES]     Frequency of Communication with Friends and Family: More than three times a week    Frequency of Social Gatherings with Friends and Family: Once a week    Attends Religious Services: More than 4 times per year    Active Member of Clubs or Organizations: Yes    Attends Banker Meetings: More than 4 times per year    Marital Status: Married  Catering manager Violence: Not At Risk (04/16/2023)   Humiliation, Afraid, Rape, and Kick questionnaire    Fear of Current or Ex-Partner: No    Emotionally Abused: No    Physically Abused: No    Sexually Abused: No   Family History:  Family History  Problem Relation Age of Onset   Cancer Father    Prostate cancer Father    Diabetes Sister    Cancer Sister    Stroke Maternal Grandfather    Colitis Maternal Aunt    Breast cancer Maternal Aunt    Colon cancer Neg Hx    Colon polyps Neg Hx    Rectal cancer Neg Hx    Stomach cancer Neg Hx    Esophageal cancer Neg Hx     Review of Systems: Constitutional: Doesn't report fevers, chills or abnormal weight loss Eyes: Doesn't report blurriness of vision Ears, nose, mouth, throat, and face: Doesn't report sore throat Respiratory: Doesn't report cough, dyspnea or wheezes Cardiovascular: Doesn't report palpitation, chest discomfort  Gastrointestinal:  Doesn't report nausea, constipation, diarrhea GU: Doesn't report incontinence Skin: Doesn't report skin rashes Neurological: Per HPI Musculoskeletal: Doesn't report joint pain Behavioral/Psych: ++anxiety  Physical Exam: Wt Readings from Last 3 Encounters:  08/27/23 135 lb 14.4 oz (61.6 kg)  07/27/23 137 lb (62.1 kg)  06/23/23 137 lb 1.6 oz (62.2 kg)   Temp Readings from Last 3 Encounters:  08/27/23 97.7 F (36.5 C)  07/27/23 98 F (36.7 C) (Oral)  06/23/23 97.9 F (36.6 C) (Temporal)   BP Readings from Last 3 Encounters:  08/27/23 129/65  07/27/23 130/62  06/23/23 138/72   Pulse Readings from Last 3 Encounters:  08/27/23  68  07/27/23 (!) 55  06/23/23 70     KPS: 80. General: Alert, cooperative, pleasant, in no acute distress Head: Normal EENT: No conjunctival injection or scleral icterus.  Lungs: Resp effort normal Cardiac: Regular rate Abdomen: Non-distended abdomen Skin: No rashes cyanosis or petechiae. Extremities: No clubbing or edema  Neurologic Exam: Mental Status: Awake, alert, attentive to examiner. Oriented to self and environment. Language is intact with regards to fluency, comprehension.  Psychomotor slowing, impaired recall. Cranial Nerves: Visual acuity is grossly normal. Visual fields are full. Extra-ocular movements intact. No ptosis. Face is symmetric Motor: Tone and bulk are normal. Power is 5/5 throughout. Reflexes are symmetric, no pathologic reflexes present.  Sensory: Intact to light touch Gait: Independent  Labs: I have reviewed the data as listed    Component Value Date/Time   NA 142 07/27/2023 0909   K 4.4 07/27/2023 0909   CL 105 07/27/2023 0909   CO2 30 07/27/2023 0909   GLUCOSE 103 (H) 07/27/2023 0909   BUN 8 07/27/2023 0909   CREATININE 0.75 07/27/2023 0909   CREATININE 0.72 06/09/2023 1432   CREATININE 0.79 03/14/2020 1016   CALCIUM 9.8 07/27/2023 0909   PROT 6.8 07/27/2023 0909   ALBUMIN 4.0 07/27/2023 0909   AST 21 07/27/2023 0909   AST 41 06/09/2023 1432   ALT 12 07/27/2023 0909  ALT 20 06/09/2023 1432   ALKPHOS 75 07/27/2023 0909   BILITOT 0.3 07/27/2023 0909   BILITOT 0.3 06/09/2023 1432   GFRNONAA >60 06/14/2023 1041   GFRNONAA >60 06/09/2023 1432   GFRNONAA 75 03/14/2020 1016   GFRAA 87 03/14/2020 1016   Lab Results  Component Value Date   WBC 4.8 07/27/2023   NEUTROABS 2.4 07/27/2023   HGB 11.7 (L) 07/27/2023   HCT 36.3 07/27/2023   MCV 82.8 07/27/2023   PLT 224.0 07/27/2023    Imaging:  [facility] Clinician Interpretation: I have personally reviewed the CNS images as listed.  My interpretation, in the context of the patient's clinical  presentation, is stable disease  MR BRAIN W WO CONTRAST Result Date: 08/25/2023 CLINICAL DATA:  CNS neoplasm, assessment of treatment response. Primary lung malignancy with metastasis. History of SRS and craniotomy in February 2022, additional [facility] in June 2022. LITT of left frontoparietal progression in April 2023 and additional [facility] in May 2023. Status post 35 cycles of chemotherapy now on maintenance treatment. EXAM: MRI HEAD WITHOUT AND WITH CONTRAST TECHNIQUE: Multiplanar, multiecho pulse sequences of the brain and surrounding structures were obtained without and with intravenous contrast. CONTRAST:  6.5 mL of Vueway intravenous contrast, 1 mL wasted COMPARISON:  MRI brain 04/21/2023 FINDINGS: Brain: Sequelae of prior left occipital craniotomy. Underlying focus of parenchymal enhancement measures up to 2.3 cm in craniocaudal dimension, previously 2.8 cm (series 15, image 9). Near complete interval resolution of scattered diffusion signal abnormality in this region. Small amount of residual edema in the left occipital and inferior left parietal lobes decreased compared to prior. Nodular focus of enhancement in the posterior right temporal lobe measures 8 mm in diameter, not significantly changed from 03/2023 when remeasured in a similar manner. Slightly increased associated edema. Additional focus of enhancement more inferiorly within the posterior right temporal lobe is significantly diminished compared to prior (series 13, image 68). Postsurgical changes of left frontoparietal craniotomy with similar enhancement of chronic treated lesion. Previously noted diffusion signal abnormality in the posterior left frontal lobe near the vertex is significantly decreased. No new foci of intracranial enhancement. No acute infarct. No evidence of acute intracranial hemorrhage. Chronic microvascular ischemic changes. Mild parenchymal volume loss. No midline shift. Slightly decreased caliber of the left occipital horn.  Ependymal enhancement is decreased from prior. Basilar cisterns are patent. Vascular: Skull base flow voids are visualized. Skull and upper cervical spine: No focal abnormality. Sinuses/Orbits: Bilateral lens replacement. Mild mucosal thickening in the paranasal sinuses. Other: Small right mastoid effusion. IMPRESSION: 1. Decreased enhancement in the left occipital lobe and decreased associated edema in the left parieto-occipital lobes. Recommend continuing imaging follow-up. 2. Unchanged focal enhancement in the posterior right temporal lobe. There is slightly increased edema in this region which may reflect post treatment changes. 3. Near complete interval resolution of enhancement more inferiorly within the posterior right temporal lobe. 4. Stable appearance of chronic treated lesion in the left frontal lobe near the vertex. 5. Decreased caliber of the left occipital horn with near interval resolution of ependymal enhancement. 6. No new foci of intracranial enhancement. Electronically Signed   By: Emily Filbert M.D.   On: 08/25/2023 14:42      Assessment/Plan Brain tumor (HCC) - Plan: MR BRAIN W WO CONTRAST, levETIRAcetam (KEPPRA) 100 MG/ML solution, DISCONTINUED: levETIRAcetam (KEPPRA) 100 MG/ML solution  Primary malignant neoplasm of lung with metastasis to brain (HCC) - Plan: Lacosamide 100 MG TABS  Aubrianna N Tracey is clinically stable today.  MRI  demonstrates stable findings today.   Will continue MRI surveillance at this time.  May dose 800mg  ibuprofen for acute headaches.  Recommended continuing Vimpat 100mg  BID and Keppra 500mg  BID liquid.  Decadron may continue 2mg  daily, may decrease to 1mg  daily if tolerated.    We appreciate the opportunity to participate in the care of [name].    We ask that [name] return to clinic in 4 months following next brain MRI, or sooner as needed.  All questions were answered. The patient knows to call the clinic with any problems,  questions or concerns. No barriers to learning were detected.  The total time spent in the encounter was 40 minutes and more than 50% was on counseling and review of test results   Henreitta Leber, MD Medical Director of Neuro-Oncology [facility] 08/27/23 2:22 PM

## 2023-08-28 ENCOUNTER — Other Ambulatory Visit: Payer: Self-pay | Admitting: Radiation Therapy

## 2023-08-28 ENCOUNTER — Telehealth: Payer: Self-pay | Admitting: Internal Medicine

## 2023-08-28 NOTE — Telephone Encounter (Signed)
 Patient Scheduled appts. Patient is aware of all appt details.

## 2023-08-31 ENCOUNTER — Telehealth: Payer: Self-pay | Admitting: Physician Assistant

## 2023-08-31 NOTE — Telephone Encounter (Signed)
 PT is calling to discuss her Linzess dosage. She feels it is not working as expected any longer and she is concerned. Please advise.

## 2023-09-01 MED ORDER — LINACLOTIDE 145 MCG PO CAPS: 145.0000 ug | ORAL_CAPSULE | Freq: Every day | ORAL | 0 refills | Status: DC

## 2023-09-01 NOTE — Telephone Encounter (Signed)
 Returned call to patient's daughter, [name]. [name] informed me that patient mentioned that she does not feel like the Linzess 72 mcg daily is working as well as it used to. Pt is having to supplement with Milk Of Magnesia 2-3 x a week. Pt is having daily bowel movements, but not as easily as she used to. I informed [name] that patient is on the lowest dose of Linzess, she may benefit from trying Linzess 145 mcg for a couple of weeks and if it works we can send in [employer]. Please advise, thanks.

## 2023-09-01 NOTE — Telephone Encounter (Signed)
 Called and spoke with [name] regarding Jennifer's recommendations. [name] would like for patient to try samples of Linzess 145 mcg first since medication is quite expensive. [name] will stop by 2nd floor receptionist desk to pick up samples for patient. If Linzess 145 mcg works well we will send in RX. [name] verbalized understanding and had no concerns at the end of the call.

## 2023-09-07 ENCOUNTER — Other Ambulatory Visit: Payer: Self-pay | Admitting: Internal Medicine

## 2023-09-07 DIAGNOSIS — C3490 Malignant neoplasm of unspecified part of unspecified bronchus or lung: Secondary | ICD-10-CM

## 2023-09-13 ENCOUNTER — Other Ambulatory Visit: Payer: Self-pay | Admitting: Internal Medicine

## 2023-09-13 DIAGNOSIS — C3490 Malignant neoplasm of unspecified part of unspecified bronchus or lung: Secondary | ICD-10-CM

## 2023-09-14 ENCOUNTER — Encounter: Payer: Self-pay | Admitting: Internal Medicine

## 2023-09-17 ENCOUNTER — Other Ambulatory Visit: Payer: Self-pay

## 2023-09-17 MED ORDER — LINACLOTIDE 145 MCG PO CAPS: 145.0000 ug | ORAL_CAPSULE | Freq: Every day | ORAL | 6 refills | Status: AC

## 2023-09-20 ENCOUNTER — Encounter: Payer: Self-pay | Admitting: Internal Medicine

## 2023-09-20 NOTE — Patient Instructions (Addendum)
      Your A1c was checked    Medications changes include :   None    A referral was ordered and someone will call you to schedule an appointment.     Return in about 6 months (around 03/23/2024) for Physical Exam.

## 2023-09-20 NOTE — Progress Notes (Signed)
 Subjective:    Patient ID: Hannah [name], female    DOB: 05/02/1951, 73 y.o.   MRN: [id number]     HPI Hannah [name] is here for follow up of her chronic medical problems.  Left breast swelling for a while.  No pain or discomfort.  She thinks it might be getting better.  She is not sure why it swollen.  She denies any pain.Marland Kitchen  Her last screening mammogram was just over a year ago.  Overall feels well.  No other concerns.  Medications and allergies reviewed with patient and updated if appropriate.  Current Outpatient Medications on File Prior to Visit  Medication Sig Dispense Refill   acetaminophen (TYLENOL) 325 MG tablet Take 325 mg by mouth every 6 (six) hours as needed for moderate pain.     ALPRAZolam (XANAX XR) 0.5 MG 24 hr tablet Take 1 tablet (0.5 mg total) by mouth every morning. 30 tablet 5   ALPRAZolam (XANAX) 0.5 MG tablet Take 1 tablet (0.5 mg total) by mouth 3 (three) times daily as needed. for anxiety 90 tablet 5   aspirin 81 MG tablet Take 1 tablet (81 mg total) by mouth daily. Restart on 08/31/20 30 tablet    bisacodyl (DULCOLAX) 5 MG EC tablet Take 5 mg by mouth daily as needed for moderate constipation.     carboxymethylcellulose (REFRESH PLUS) 0.5 % SOLN Place 1-2 drops into both eyes 3 (three) times daily as needed (dry eyes).     Cholecalciferol (D3 PO) Take 1 capsule by mouth daily.     citalopram (CELEXA) 10 MG tablet Take 1 tablet (10 mg total) by mouth daily. 90 tablet 1   dexamethasone (DECADRON) 2 MG tablet Take 1 tablet (2 mg total) by mouth daily. (Patient not taking: Reported on 08/27/2023) 30 tablet 0   fluticasone (FLONASE) 50 MCG/ACT nasal spray USE 2 SPRAYS IN EACH NOSTRIL EVERY DAY 48 g 3   folic acid (FOLVITE) 1 MG tablet Take 1 tablet by mouth once daily 30 tablet 0   HYDROcodone-acetaminophen (HYCET) 7.5-325 mg/15 ml solution 15 ml every 6 hours as needed for moderate to severe pain 150 mL 0   hydrocortisone (ANUSOL-HC) 25 MG suppository Place 1  suppository (25 mg total) rectally 2 (two) times daily. 12 suppository 0   ketoconazole (NIZORAL) 2 % cream APPLY 1 APPLICATION TOPICALLY DAILY 30 g 0   Lacosamide 100 MG TABS Take 1 tablet (100 mg total) by mouth 2 (two) times daily. 60 tablet 3   levETIRAcetam (KEPPRA) 100 MG/ML solution TAKE 5 ML BY MOUTH  TWICE DAILY 473 mL 2   lidocaine-prilocaine (EMLA) cream Apply 1 Application topically as needed. 30 g 2   linaclotide (LINZESS) 145 MCG CAPS capsule Take 1 capsule (145 mcg total) by mouth daily before breakfast. 30 capsule 6   loratadine (CLARITIN) 10 MG tablet Take 10 mg by mouth daily as needed for allergies.     magnesium hydroxide (MILK OF MAGNESIA) 800 MG/5ML suspension Take 30 mLs by mouth daily as needed for constipation.     Menthol, Topical Analgesic, (BIOFREEZE EX) Apply 1 application topically as needed (pain).     mirtazapine (REMERON) 15 MG tablet Take 1 tablet (15 mg total) by mouth at bedtime. 90 tablet 1   olopatadine (PATANOL) 0.1 % ophthalmic solution Place 1 drop into both eyes 2 (two) times daily as needed for allergies.     omeprazole (PRILOSEC) 40 MG capsule Take 1 capsule (40 mg total) by  mouth daily. 90 capsule 1   polyethylene glycol (MIRALAX / GLYCOLAX) 17 g packet Take 17 g by mouth 2 (two) times daily. 72 each 0   pravastatin (PRAVACHOL) 80 MG tablet Take 1 tablet (80 mg total) by mouth every evening. 90 tablet 3   Probiotic Product (PROBIOTIC DAILY PO) Take 1 capsule by mouth daily.     prochlorperazine (COMPAZINE) 10 MG tablet TAKE 1 TABLET BY MOUTH EVERY 6 HOURS AS NEEDED FOR NAUSEA OR VOMITING 30 tablet 0   simethicone (MYLICON) 80 MG chewable tablet Chew 1 tablet (80 mg total) by mouth 4 (four) times daily as needed for flatulence (Bloating). 30 tablet 0   No current facility-administered medications on file prior to visit.     Review of Systems  Constitutional:  Negative for fever.  Respiratory:  Negative for cough, shortness of breath and wheezing.    Cardiovascular:  Negative for chest pain, palpitations and leg swelling.  Neurological:  Negative for light-headedness and headaches.       Objective:  There were no vitals filed for this visit. BP Readings from Last 3 Encounters:  08/27/23 129/65  07/27/23 130/62  06/23/23 138/72   Wt Readings from Last 3 Encounters:  08/27/23 135 lb 14.4 oz (61.6 kg)  07/27/23 137 lb (62.1 kg)  06/23/23 137 lb 1.6 oz (62.2 kg)   There is no height or weight on file to calculate BMI.    Physical Exam Constitutional:      General: She is not in acute distress.    Appearance: Normal appearance.  HENT:     Head: Normocephalic and atraumatic.  Eyes:     Conjunctiva/sclera: Conjunctivae normal.  Cardiovascular:     Rate and Rhythm: Normal rate and regular rhythm.     Heart sounds: Normal heart sounds.  Pulmonary:     Effort: Pulmonary effort is normal. No respiratory distress.     Breath sounds: Normal breath sounds. No wheezing.  Chest:  Breasts:    Left: Swelling (Left breast larger than right breast-no obvious edema) present. No mass, skin change or tenderness.  Musculoskeletal:     Cervical back: Neck supple.     Right lower leg: No edema.     Left lower leg: No edema.  Lymphadenopathy:     Cervical: No cervical adenopathy.  Skin:    General: Skin is warm and dry.     Findings: No rash.  Neurological:     Mental Status: She is alert. Mental status is at baseline.  Psychiatric:        Mood and Affect: Mood normal.        Behavior: Behavior normal.        Lab Results  Component Value Date   WBC 4.8 07/27/2023   HGB 11.7 (L) 07/27/2023   HCT 36.3 07/27/2023   PLT 224.0 07/27/2023   GLUCOSE 103 (H) 07/27/2023   CHOL 196 03/24/2023   TRIG 183.0 (H) 03/24/2023   HDL 84.00 03/24/2023   LDLDIRECT 89.0 03/21/2022   LDLCALC 75 03/24/2023   ALT 12 07/27/2023   AST 21 07/27/2023   NA 142 07/27/2023   K 4.4 07/27/2023   CL 105 07/27/2023   CREATININE 0.75 07/27/2023   BUN  8 07/27/2023   CO2 30 07/27/2023   TSH 0.79 07/27/2023   INR 1.0 02/12/2021   HGBA1C 6.2 03/24/2023   MICROALBUR <0.7 03/24/2023     Assessment & Plan:    See Problem List for Assessment and Plan of  chronic medical problems.

## 2023-09-21 ENCOUNTER — Inpatient Hospital Stay: Payer: Medicare HMO | Attending: Internal Medicine

## 2023-09-21 ENCOUNTER — Ambulatory Visit (INDEPENDENT_AMBULATORY_CARE_PROVIDER_SITE_OTHER): Payer: Medicare HMO | Admitting: Internal Medicine

## 2023-09-21 ENCOUNTER — Ambulatory Visit (HOSPITAL_COMMUNITY)
Admission: RE | Admit: 2023-09-21 | Discharge: 2023-09-21 | Disposition: A | Discharge status: Home / Self Care | Payer: PPO | Source: Ambulatory Visit | Attending: Physician Assistant

## 2023-09-21 VITALS — BP 120/74 | HR 68 | Temp 98.1°F | Ht 60.0 in | Wt 135.0 lb

## 2023-09-21 DIAGNOSIS — C7951 Secondary malignant neoplasm of bone: Secondary | ICD-10-CM | POA: Diagnosis not present

## 2023-09-21 DIAGNOSIS — C7931 Secondary malignant neoplasm of brain: Secondary | ICD-10-CM

## 2023-09-21 DIAGNOSIS — C3411 Malignant neoplasm of upper lobe, right bronchus or lung: Secondary | ICD-10-CM

## 2023-09-21 DIAGNOSIS — Z79899 Other long term (current) drug therapy: Secondary | ICD-10-CM | POA: Insufficient documentation

## 2023-09-21 DIAGNOSIS — F3289 Other specified depressive episodes: Secondary | ICD-10-CM | POA: Diagnosis not present

## 2023-09-21 DIAGNOSIS — I700 Atherosclerosis of aorta: Secondary | ICD-10-CM | POA: Diagnosis not present

## 2023-09-21 DIAGNOSIS — N63 Unspecified lump in unspecified breast: Secondary | ICD-10-CM

## 2023-09-21 DIAGNOSIS — G479 Sleep disorder, unspecified: Secondary | ICD-10-CM | POA: Diagnosis not present

## 2023-09-21 DIAGNOSIS — F419 Anxiety disorder, unspecified: Secondary | ICD-10-CM | POA: Diagnosis not present

## 2023-09-21 DIAGNOSIS — Z90710 Acquired absence of both cervix and uterus: Secondary | ICD-10-CM | POA: Diagnosis not present

## 2023-09-21 DIAGNOSIS — C3491 Malignant neoplasm of unspecified part of right bronchus or lung: Secondary | ICD-10-CM

## 2023-09-21 DIAGNOSIS — C3490 Malignant neoplasm of unspecified part of unspecified bronchus or lung: Secondary | ICD-10-CM | POA: Insufficient documentation

## 2023-09-21 DIAGNOSIS — E1169 Type 2 diabetes mellitus with other specified complication: Secondary | ICD-10-CM

## 2023-09-21 DIAGNOSIS — K5730 Diverticulosis of large intestine without perforation or abscess without bleeding: Secondary | ICD-10-CM | POA: Diagnosis not present

## 2023-09-21 DIAGNOSIS — E782 Mixed hyperlipidemia: Secondary | ICD-10-CM

## 2023-09-21 DIAGNOSIS — K219 Gastro-esophageal reflux disease without esophagitis: Secondary | ICD-10-CM | POA: Diagnosis not present

## 2023-09-21 DIAGNOSIS — Z95828 Presence of other vascular implants and grafts: Secondary | ICD-10-CM

## 2023-09-21 LAB — CBC WITH DIFFERENTIAL (CANCER CENTER ONLY)
Abs Immature Granulocytes: 0.01 10*3/uL (ref 0.00–0.07)
Basophils Absolute: 0 10*3/uL (ref 0.0–0.1)
Basophils Relative: 1 %
Eosinophils Absolute: 0.1 10*3/uL (ref 0.0–0.5)
Eosinophils Relative: 1 %
HCT: 34.7 % — ABNORMAL LOW (ref 36.0–46.0)
Hemoglobin: 10.8 g/dL — ABNORMAL LOW (ref 12.0–15.0)
Immature Granulocytes: 0 %
Lymphocytes Relative: 40 %
Lymphs Abs: 1.8 10*3/uL (ref 0.7–4.0)
MCH: 25.9 pg — ABNORMAL LOW (ref 26.0–34.0)
MCHC: 31.1 g/dL (ref 30.0–36.0)
MCV: 83.2 fL (ref 80.0–100.0)
Monocytes Absolute: 0.6 10*3/uL (ref 0.1–1.0)
Monocytes Relative: 13 %
Neutro Abs: 2 10*3/uL (ref 1.7–7.7)
Neutrophils Relative %: 45 %
Platelet Count: 203 10*3/uL (ref 150–400)
RBC: 4.17 MIL/uL (ref 3.87–5.11)
RDW: 12.6 % (ref 11.5–15.5)
WBC Count: 4.5 10*3/uL (ref 4.0–10.5)
nRBC: 0 % (ref 0.0–0.2)

## 2023-09-21 LAB — CMP (CANCER CENTER ONLY)
ALT: 9 U/L (ref 0–44)
AST: 18 U/L (ref 15–41)
Albumin: 3.8 g/dL (ref 3.5–5.0)
Alkaline Phosphatase: 83 U/L (ref 38–126)
Anion gap: 3 — ABNORMAL LOW (ref 5–15)
BUN: 10 mg/dL (ref 8–23)
CO2: 31 mmol/L (ref 22–32)
Calcium: 9.5 mg/dL (ref 8.9–10.3)
Chloride: 106 mmol/L (ref 98–111)
Creatinine: 0.6 mg/dL (ref 0.44–1.00)
GFR, Estimated: >60 mL/min (ref >60)
Glucose, Bld: 109 mg/dL — ABNORMAL HIGH (ref 70–99)
Potassium: 4 mmol/L (ref 3.5–5.1)
Sodium: 140 mmol/L (ref 135–145)
Total Bilirubin: 0.2 mg/dL (ref 0.0–1.2)
Total Protein: 6.5 g/dL (ref 6.5–8.1)

## 2023-09-21 LAB — POCT GLYCOSYLATED HEMOGLOBIN (HGB A1C)
HbA1c POC (<> result, manual entry): 6.1 % (ref 4.0–5.6)
HbA1c, POC (controlled diabetic range): 6.1 % (ref 0.0–7.0)
HbA1c, POC (prediabetic range): 6.1 % (ref 5.7–6.4)
Hemoglobin A1C: 6.1 % — AB (ref 4.0–5.6)

## 2023-09-21 MED ORDER — FLUTICASONE PROPIONATE 50 MCG/ACT NA SUSP: 2.0000 | Freq: Every day | NASAL | 3 refills | Status: AC

## 2023-09-21 MED ORDER — HYDROCORTISONE ACETATE 25 MG RE SUPP: 25.0000 mg | Freq: Two times a day (BID) | RECTAL | 0 refills | Status: AC

## 2023-09-21 MED ORDER — IOHEXOL 300 MG/ML  SOLN
100.0000 mL | Freq: Once | INTRAMUSCULAR | Status: AC | PRN
  Administered 2023-09-21: 100 mL via INTRAVENOUS

## 2023-09-21 MED ORDER — ALPRAZOLAM 0.5 MG PO TABS: 0.5000 mg | ORAL_TABLET | Freq: Three times a day (TID) | ORAL | 5 refills | Status: DC | PRN

## 2023-09-21 MED ORDER — HEPARIN SOD (PORK) LOCK FLUSH 100 UNIT/ML IV SOLN
INTRAVENOUS | Status: AC
  Filled 2023-09-21: qty 5

## 2023-09-21 MED ORDER — HEPARIN SOD (PORK) LOCK FLUSH 100 UNIT/ML IV SOLN
500.0000 [IU] | Freq: Once | INTRAVENOUS | Status: AC
  Administered 2023-09-21: 500 [IU] via INTRAVENOUS

## 2023-09-21 MED ORDER — SODIUM CHLORIDE 0.9% FLUSH
10.0000 mL | Freq: Once | INTRAVENOUS | Status: AC
  Administered 2023-09-21: 10 mL

## 2023-09-21 MED ORDER — ALPRAZOLAM ER 0.5 MG PO TB24: 0.5000 mg | ORAL_TABLET | Freq: Every morning | ORAL | 5 refills | Status: DC

## 2023-09-21 MED ORDER — OMEPRAZOLE 40 MG PO CPDR: 40.0000 mg | DELAYED_RELEASE_CAPSULE | Freq: Every day | ORAL | 1 refills | Status: DC

## 2023-09-21 NOTE — Assessment & Plan Note (Signed)
 Chronic Regular exercise and healthy diet encouraged Lab Results  Component Value Date   LDLCALC 75 03/24/2023   Continue pravastatin 80 mg daily.

## 2023-09-21 NOTE — Assessment & Plan Note (Signed)
 Chronic Sleeping well most nights Continue taking Remeron 7.5 mg nightly

## 2023-09-21 NOTE — Assessment & Plan Note (Addendum)
 Acute on chronic Controlled Continue mirtazapine 7.5 mg nightly-only takes half a pill Continue extended release alprazolam and quick acting alprazolam Encouraged regular exercise

## 2023-09-21 NOTE — Assessment & Plan Note (Addendum)
 Chronic With hyperlipidemia  Lab Results  Component Value Date   HGBA1C 6.2 03/24/2023   Sugars well controlled Check A1c Continue lifestyle control Stressed diabetic diet

## 2023-09-21 NOTE — Assessment & Plan Note (Signed)
 Chronic GERD controlled Continue omeprazole 40 mg daily

## 2023-09-21 NOTE — Assessment & Plan Note (Addendum)
 Chronic Controlled Continue mirtazapine 7.5 mg nightly, citalopram 10 mg daily

## 2023-09-25 DIAGNOSIS — N6489 Other specified disorders of breast: Secondary | ICD-10-CM | POA: Diagnosis not present

## 2023-09-25 LAB — HM MAMMOGRAPHY

## 2023-10-05 ENCOUNTER — Ambulatory Visit: Payer: Medicare HMO | Admitting: Internal Medicine

## 2023-10-07 ENCOUNTER — Inpatient Hospital Stay: Payer: Self-pay | Attending: Internal Medicine | Admitting: Internal Medicine

## 2023-10-07 VITALS — BP 122/70 | HR 74 | Temp 97.7°F | Resp 16 | Ht 60.0 in | Wt 132.2 lb

## 2023-10-07 DIAGNOSIS — C7931 Secondary malignant neoplasm of brain: Secondary | ICD-10-CM | POA: Diagnosis not present

## 2023-10-07 DIAGNOSIS — Z902 Acquired absence of lung [part of]: Secondary | ICD-10-CM | POA: Insufficient documentation

## 2023-10-07 DIAGNOSIS — M47816 Spondylosis without myelopathy or radiculopathy, lumbar region: Secondary | ICD-10-CM | POA: Diagnosis not present

## 2023-10-07 DIAGNOSIS — Z888 Allergy status to other drugs, medicaments and biological substances status: Secondary | ICD-10-CM | POA: Diagnosis not present

## 2023-10-07 DIAGNOSIS — I2510 Atherosclerotic heart disease of native coronary artery without angina pectoris: Secondary | ICD-10-CM | POA: Diagnosis not present

## 2023-10-07 DIAGNOSIS — Z90710 Acquired absence of both cervix and uterus: Secondary | ICD-10-CM | POA: Diagnosis not present

## 2023-10-07 DIAGNOSIS — C3490 Malignant neoplasm of unspecified part of unspecified bronchus or lung: Secondary | ICD-10-CM

## 2023-10-07 DIAGNOSIS — I739 Peripheral vascular disease, unspecified: Secondary | ICD-10-CM | POA: Insufficient documentation

## 2023-10-07 DIAGNOSIS — C3411 Malignant neoplasm of upper lobe, right bronchus or lung: Secondary | ICD-10-CM | POA: Insufficient documentation

## 2023-10-07 DIAGNOSIS — Z87891 Personal history of nicotine dependence: Secondary | ICD-10-CM | POA: Insufficient documentation

## 2023-10-07 DIAGNOSIS — R232 Flushing: Secondary | ICD-10-CM | POA: Diagnosis not present

## 2023-10-07 DIAGNOSIS — I700 Atherosclerosis of aorta: Secondary | ICD-10-CM | POA: Insufficient documentation

## 2023-10-07 DIAGNOSIS — K5730 Diverticulosis of large intestine without perforation or abscess without bleeding: Secondary | ICD-10-CM | POA: Diagnosis not present

## 2023-10-07 DIAGNOSIS — M47814 Spondylosis without myelopathy or radiculopathy, thoracic region: Secondary | ICD-10-CM | POA: Diagnosis not present

## 2023-10-07 DIAGNOSIS — F419 Anxiety disorder, unspecified: Secondary | ICD-10-CM | POA: Insufficient documentation

## 2023-10-07 DIAGNOSIS — Z8719 Personal history of other diseases of the digestive system: Secondary | ICD-10-CM | POA: Insufficient documentation

## 2023-10-07 DIAGNOSIS — Z79899 Other long term (current) drug therapy: Secondary | ICD-10-CM | POA: Insufficient documentation

## 2023-10-07 DIAGNOSIS — E785 Hyperlipidemia, unspecified: Secondary | ICD-10-CM | POA: Diagnosis not present

## 2023-10-07 NOTE — Progress Notes (Signed)
 [facility] [phone]   [phone]  OFFICE PROGRESS NOTE  Pincus Sanes, MD [address] [place] [address]  DIAGNOSIS: Stage IV non-small cell lung cancer (T3, N0, M1C) adenocarcinoma.  The patient presented with a and solitary brain metastasis.  The patient was diagnosed in February 2022.  Biomarker Findings Microsatellite status - MS-Stable Tumor Mutational Burden - 8 Muts/Mb Genomic Findings For a complete list of the genes assayed, please refer to the Appendix. KRAS G12V KEAP1 S224F TP53 P151T 7 Disease relevant genes with no reportable alterations: ALK, BRAF, EGFR, ERBB2, MET, RET, ROS1  PDL1 Expression: 50%    PRIOR THERAPY: 1) SRS to the metastatic brain lesion on 08/23/2020 under the care of Dr. [name] and craniotomy under the care of Dr. Maurice Small scheduled for 08/24/2020. 2) S/p robotic assisted right upper lobectomy with en bloc wedge resection of the right middle lobe and lymph node dissection under the care of Dr. Dorris Fetch on September 24, 2020. 3) status post SRS to a new subcentimeter brain lesions under the care of Dr. Mitzi Hansen. 4) SRS to brain metastasis. 5) Systemic chemotherapy with carboplatin for AUC of 5, Alimta 500 Mg/M2 and Keytruda 200 mg IV every 3 weeks.  First dose Nov 07, 2020.  Status post 35 cycles.  Starting from cycle #5 the patient will be on maintenance treatment with Alimta and Keytruda every 3 weeks.   CURRENT THERAPY: Observation.  INTERVAL HISTORY: Hannah [name] 73 y.o. female returns to the clinic today for follow-up visit accompanied by her husband. Discussed the use of AI scribe software for clinical note transcription with the patient, who gave verbal consent to proceed.  History of Present Illness   Hannah [name] is a 73 year old female with carcinoma who presents for evaluation and repeat imaging studies for staging of her disease. She is accompanied by her boyfriend of almost fifty  years.  She has a history of carcinoma diagnosed in February 2022. Treatment included carboplatin, Alimta, and Keytruda for four cycles, followed by maintenance therapy with Alimta and Keytruda for a total of thirty-five cycles. Since May 2024, she has been under observation.  She is followed by a neuro-oncologist for brain metastasis and undergoes regular MRI scans.  No new complaints since her last visit. No chest pain, shortness of breath, cough, hemoptysis, nausea, vomiting, diarrhea, or significant weight loss, although there is a minor weight change of a few pounds. She mentions experiencing two episodes of very sharp pain, but does not specify the location or duration.  There is concern about soreness at the site of her port, which was used recently, resulting in some bruising.       MEDICAL HISTORY: Past Medical History:  Diagnosis Date   Allergy    Anemia    Anxiety    Arthritis    Carpal tunnel syndrome    Constipation    senna C stool softeners help    Depression    Diverticulosis    Dyslipidemia    External hemorrhoids    GERD (gastroesophageal reflux disease)    Heart murmur    mild-moderate AR   Hiatal hernia    Hyperlipidemia    on meds    Hypertension    Internal hemorrhoids    lung ca with brain mets 07/2020   Osteoarthritis    Pre-diabetes    PVD (peripheral vascular disease) (HCC)    moderate carotid disease   RBBB  Smoker    Vocal cord polyps     ALLERGIES:  is allergic to amlodipine, chantix [varenicline tartrate], clarithromycin, lisinopril, simvastatin, wellbutrin [bupropion hcl], lipitor [atorvastatin], and sertraline.  MEDICATIONS:  Current Outpatient Medications  Medication Sig Dispense Refill   acetaminophen (TYLENOL) 325 MG tablet Take 325 mg by mouth every 6 (six) hours as needed for moderate pain.     ALPRAZolam (XANAX XR) 0.5 MG 24 hr tablet Take 1 tablet (0.5 mg total) by mouth every morning. 30 tablet 5   ALPRAZolam (XANAX) 0.5 MG  tablet Take 1 tablet (0.5 mg total) by mouth 3 (three) times daily as needed. for anxiety 90 tablet 5   aspirin 81 MG tablet Take 1 tablet (81 mg total) by mouth daily. Restart on 08/31/20 30 tablet    bisacodyl (DULCOLAX) 5 MG EC tablet Take 5 mg by mouth daily as needed for moderate constipation.     carboxymethylcellulose (REFRESH PLUS) 0.5 % SOLN Place 1-2 drops into both eyes 3 (three) times daily as needed (dry eyes).     Cholecalciferol (D3 PO) Take 1 capsule by mouth daily.     citalopram (CELEXA) 10 MG tablet Take 1 tablet (10 mg total) by mouth daily. 90 tablet 1   fluticasone (FLONASE) 50 MCG/ACT nasal spray Place 2 sprays into both nostrils daily. 48 g 3   folic acid (FOLVITE) 1 MG tablet Take 1 tablet by mouth once daily 30 tablet 0   HYDROcodone-acetaminophen (HYCET) 7.5-325 mg/15 ml solution 15 ml every 6 hours as needed for moderate to severe pain 150 mL 0   hydrocortisone (ANUSOL-HC) 25 MG suppository Place 1 suppository (25 mg total) rectally 2 (two) times daily. 12 suppository 0   ketoconazole (NIZORAL) 2 % cream APPLY 1 APPLICATION TOPICALLY DAILY 30 g 0   Lacosamide 100 MG TABS Take 1 tablet (100 mg total) by mouth 2 (two) times daily. 60 tablet 3   levETIRAcetam (KEPPRA) 100 MG/ML solution TAKE 5 ML BY MOUTH  TWICE DAILY 473 mL 2   lidocaine-prilocaine (EMLA) cream Apply 1 Application topically as needed. 30 g 2   linaclotide (LINZESS) 145 MCG CAPS capsule Take 1 capsule (145 mcg total) by mouth daily before breakfast. 30 capsule 6   loratadine (CLARITIN) 10 MG tablet Take 10 mg by mouth daily as needed for allergies.     magnesium hydroxide (MILK OF MAGNESIA) 800 MG/5ML suspension Take 30 mLs by mouth daily as needed for constipation.     Menthol, Topical Analgesic, (BIOFREEZE EX) Apply 1 application topically as needed (pain).     mirtazapine (REMERON) 15 MG tablet Take 1 tablet (15 mg total) by mouth at bedtime. 90 tablet 1   olopatadine (PATANOL) 0.1 % ophthalmic solution  Place 1 drop into both eyes 2 (two) times daily as needed for allergies.     omeprazole (PRILOSEC) 40 MG capsule Take 1 capsule (40 mg total) by mouth daily. 90 capsule 1   polyethylene glycol (MIRALAX / GLYCOLAX) 17 g packet Take 17 g by mouth 2 (two) times daily. 72 each 0   pravastatin (PRAVACHOL) 80 MG tablet Take 1 tablet (80 mg total) by mouth every evening. 90 tablet 3   Probiotic Product (PROBIOTIC DAILY PO) Take 1 capsule by mouth daily.     prochlorperazine (COMPAZINE) 10 MG tablet TAKE 1 TABLET BY MOUTH EVERY 6 HOURS AS NEEDED FOR NAUSEA OR VOMITING 30 tablet 0   simethicone (MYLICON) 80 MG chewable tablet Chew 1 tablet (80 mg total) by mouth  4 (four) times daily as needed for flatulence (Bloating). 30 tablet 0   No current facility-administered medications for this visit.    SURGICAL HISTORY:  Past Surgical History:  Procedure Laterality Date   ABDOMINAL HYSTERECTOMY  1994   APPLICATION OF CRANIAL NAVIGATION N/A 08/24/2020   Procedure: APPLICATION OF CRANIAL NAVIGATION;  Surgeon: Jadene Pierini, MD;  Location: [facility] OR;  Service: Neurosurgery;  Laterality: N/A;   COLONOSCOPY     COLONOSCOPY W/ POLYPECTOMY  2009   CRANIOTOMY Left 08/24/2020   Procedure: Left Craniotomy for Tumor Resection with Brainlab;  Surgeon: Jadene Pierini, MD;  Location: [facility] OR;  Service: Neurosurgery;  Laterality: Left;   HEMORRHOID SURGERY     INTERCOSTAL NERVE BLOCK Right 09/24/2020   Procedure: INTERCOSTAL NERVE BLOCK;  Surgeon: Loreli Slot, MD;  Location: [facility] OR;  Service: Thoracic;  Laterality: Right;   IR IMAGING GUIDED PORT INSERTION  05/15/2022   JOINT REPLACEMENT     LYMPH NODE DISSECTION Right 09/24/2020   Procedure: LYMPH NODE DISSECTION;  Surgeon: Loreli Slot, MD;  Location: [facility] OR;  Service: Thoracic;  Laterality: Right;   POLYPECTOMY     right total knee arthroplasty     Dr. Sherle Poe 06-04-18   SPINE SURGERY  08/16/2009   TOTAL KNEE ARTHROPLASTY Left 02/13/2014    Procedure: TOTAL KNEE ARTHROPLASTY;  Surgeon: Harvie Junior, MD;  Location: [facility] OR;  Service: Orthopedics;  Laterality: Left;   TOTAL KNEE ARTHROPLASTY Right 06/04/2018   Procedure: RIGHT TOTAL KNEE ARTHROPLASTY;  Surgeon: Jodi Geralds, MD;  Location: [facility] ORS;  Service: Orthopedics;  Laterality: Right;  Adductor Block   TUBAL LIGATION      REVIEW OF SYSTEMS:  Constitutional: negative Eyes: negative Ears, nose, mouth, throat, and face: negative Respiratory: negative Cardiovascular: negative Gastrointestinal: negative Genitourinary:negative Integument/breast: negative Hematologic/lymphatic: negative Musculoskeletal:negative Neurological: negative Behavioral/Psych: negative Endocrine: negative Allergic/Immunologic: negative   PHYSICAL EXAMINATION: General appearance: alert, cooperative, fatigued, and no distress Head: Normocephalic, without obvious abnormality, atraumatic Neck: no adenopathy, no JVD, supple, symmetrical, trachea midline, and thyroid not enlarged, symmetric, no tenderness/mass/nodules Lymph nodes: Cervical, supraclavicular, and axillary nodes normal. Resp: clear to auscultation bilaterally Back: symmetric, no curvature. ROM normal. No CVA tenderness. Cardio: regular rate and rhythm, S1, S2 normal, no murmur, click, rub or gallop GI: soft, non-tender; bowel sounds normal; no masses,  no organomegaly Extremities: extremities normal, atraumatic, no cyanosis or edema Neurologic: Alert and oriented X 3, normal strength and tone. Normal symmetric reflexes. Normal coordination and gait  ECOG PERFORMANCE STATUS: 1 - Symptomatic but completely ambulatory  Blood pressure 122/70, pulse 74, temperature 97.7 F (36.5 C), temperature source Temporal, resp. rate 16, height 5' (1.524 m), weight 132 lb 3.2 oz (60 kg), SpO2 100%.  LABORATORY DATA: Lab Results  Component Value Date   WBC 4.5 09/21/2023   HGB 10.8 (L) 09/21/2023   HCT 34.7 (L) 09/21/2023   MCV 83.2 09/21/2023   PLT  203 09/21/2023      Chemistry      Component Value Date/Time   NA 140 09/21/2023 1024   K 4.0 09/21/2023 1024   CL 106 09/21/2023 1024   CO2 31 09/21/2023 1024   BUN 10 09/21/2023 1024   CREATININE 0.60 09/21/2023 1024   CREATININE 0.79 03/14/2020 1016      Component Value Date/Time   CALCIUM 9.5 09/21/2023 1024   ALKPHOS 83 09/21/2023 1024   AST 18 09/21/2023 1024   ALT 9 09/21/2023 1024   BILITOT 0.2 09/21/2023 1024  RADIOGRAPHIC STUDIES: CT CHEST ABDOMEN PELVIS W CONTRAST Result Date: 10/01/2023 CLINICAL DATA:  Follow-up non-small cell lung cancer EXAM: CT CHEST, ABDOMEN, AND PELVIS WITH CONTRAST TECHNIQUE: Multidetector CT imaging of the chest, abdomen and pelvis was performed following the standard protocol during bolus administration of intravenous contrast. RADIATION DOSE REDUCTION: This exam was performed according to the departmental dose-optimization program which includes automated exposure control, adjustment of the mA and/or kV according to patient size and/or use of iterative reconstruction technique. CONTRAST:  OMNIPAQUE IOHEXOL 300 MG/ML  SOLN COMPARISON:  CT abdomen/pelvis dated 06/14/2023. CT chest abdomen pelvis dated 06/09/2023. FINDINGS: CT CHEST FINDINGS Cardiovascular: The heart is normal in size. No pericardial effusion. No evidence of thoracic aneurysm. Atherosclerotic calcifications of the aortic arch. Mild coronary atherosclerosis of the LAD. Right chest port terminates in the upper right atrium. Mediastinum/Nodes: No suspicious mediastinal lymphadenopathy. Visualized thyroid is unremarkable. Lungs/Pleura: Status post right upper lobectomy. Status post right middle lobe wedge resection. Stable scarring along the suture line. Three left lung nodules measuring up to 3 mm (series 4/images 74, 78, and 113), unchanged across multiple priors, benign. No focal consolidation. No pleural effusion or pneumothorax. Musculoskeletal: Degenerative changes of the  thoracic spine. CT ABDOMEN PELVIS FINDINGS Hepatobiliary: Liver is within normal limits. Gallbladder is unremarkable. No intrahepatic or extrahepatic ductal dilatation. Pancreas: Within normal limits. Spleen: Within normal limits. Adrenals/Urinary Tract: Adrenal glands are within normal limits. Kidneys are within normal limits.  No hydronephrosis. Bladder is mildly thick-walled although underdistended. Stomach/Bowel: Stomach is within normal limits. No evidence of bowel obstruction. Normal appendix (series 2/image 82). Mild left colonic diverticulosis, without evidence of diverticulitis. Vascular/Lymphatic: No evidence of abdominal aortic aneurysm. Atherosclerotic calcifications of the abdominal aorta and branch vessels, although vessels remain patent. No suspicious abdominopelvic lymphadenopathy. Reproductive: Status post hysterectomy. No adnexal masses. Other: No abdominopelvic ascites. Musculoskeletal: Degenerative changes the lumbar spine. Postsurgical changes at L3-5. IMPRESSION: Status post right upper lobectomy and right middle lobe wedge resection. No evidence of recurrent or metastatic disease. Additional stable ancillary findings as above. Electronically Signed   By: Charline Bills M.D.   On: 10/01/2023 22:36     ASSESSMENT AND PLAN: This is a very pleasant 73 years old African-American female diagnosed with stage IV (T3, N0, M1b) non-small cell lung cancer, adenocarcinoma presented with right upper lobe lung mass with solitary brain metastasis diagnosed in February 2022 status post SRS to the brain lesion followed by craniotomy and resection. The patient also has a solitary lung mass. S/p robotic assisted right upper lobectomy with en bloc wedge resection of the right middle lobe and lymph node dissection under the care of Dr. Dorris Fetch on September 24, 2020. She also underwent SRS treatment to a new subcentimeter brain lesions under the care of Dr. Mitzi Hansen She underwent systemic chemotherapy with  carboplatin for AUC of 5, Alimta 500 Mg/M2 and Keytruda 200 Mg IV every 3 weeks status post 35 cycles.  Starting from cycle #5 the patient will be on maintenance treatment with Alimta and Keytruda every 3 weeks.   The patient tolerated her previous treatment fairly well with no concerning adverse effect except for fatigue.  She completed 2 years of treatment with Keytruda. The patient is currently on observation and she is feeling fine. She had repeat CT scan of the chest, abdomen and pelvis that showed no concerning findings for disease progression.     Stage IV Non-Small Cell Lung Cancer (NSCLC) Stage IV NSCLC, initially diagnosed in February 2022. Treated with carboplatin,  Alimta, and Keytruda for four cycles, followed by maintenance therapy with Alimta and Keytruda for thirty-five cycles. On observation since May 2024. Current imaging of the chest, abdomen, and pelvis shows no disease progression or metastasis. Reports no new symptoms such as chest pain, dyspnea, or cough. Blood work remains stable. - Schedule repeat imaging of the chest, abdomen, and pelvis in four months. - Perform blood work in four months.  Brain Metastasis Under neuro-oncologist Dr. Barbaraann Cao care for brain metastasis. Regular MRI scans performed. No new neurological symptoms reported. - Continue follow-up with neuro-oncologist Dr. Barbaraann Cao for brain metastasis monitoring.  Port Management Reports soreness at the port site post-use, but no concerning signs. Port to be flushed every two months to maintain patency. - Arrange for port flushing every two months. - Monitor the port site for signs of infection or complications.   The patient was advised to call immediately if she has any concerning symptoms in the interval.  The patient voices understanding of current disease status and treatment options and is in agreement with the current care plan.  All questions were answered. The patient knows to call the clinic with any  problems, questions or concerns. We can certainly see the patient much sooner if necessary. The total time spent in the appointment was 30 minutes.  Disclaimer: This note was dictated with voice recognition software. Similar sounding words can inadvertently be transcribed and may not be corrected upon review.

## 2023-10-12 ENCOUNTER — Telehealth: Payer: Self-pay | Admitting: Internal Medicine

## 2023-10-12 ENCOUNTER — Encounter: Payer: Self-pay | Admitting: Internal Medicine

## 2023-10-12 ENCOUNTER — Other Ambulatory Visit (HOSPITAL_COMMUNITY): Payer: Self-pay

## 2023-10-12 NOTE — Telephone Encounter (Signed)
 Copied from CRM [id number]. Topic: General - Other >> Oct 12, 2023  1:47 PM Adrionna Y wrote: Reason for CRM: Patient is calling in because insurance will not cover ALPRAZolam (XANAX XR) 0.5 MG 24 hr tablet without proof of why the extended release is needed

## 2023-10-13 ENCOUNTER — Telehealth: Payer: Self-pay

## 2023-10-13 ENCOUNTER — Other Ambulatory Visit (HOSPITAL_COMMUNITY): Payer: Self-pay

## 2023-10-13 NOTE — Telephone Encounter (Signed)
 PA request has been Submitted. New Encounter has been or will be created for follow up. For additional info see Pharmacy Prior Auth telephone encounter from 10/13/2023.

## 2023-10-13 NOTE — Telephone Encounter (Signed)
 Pharmacy Patient Advocate Encounter   Received notification from Onbase that prior authorization for ALPRAZolam ER 0.5MG  er tablets is required/requested.   Insurance verification completed.   The patient is insured through [facility] ADVANTAGE/RX ADVANCE .   Per test claim: PA required; PA submitted to above mentioned insurance via CoverMyMeds Key/confirmation #/EOC [id number] Status is pending

## 2023-10-13 NOTE — Telephone Encounter (Signed)
 Pharmacy Patient Advocate Encounter  Received notification from [facility] [employer] that Prior Authorization for ALPRAZolam ER 0.5MG  er tablets has been DENIED.  Full denial letter will be uploaded to the media tab. See denial reason below.   PA #/Case ID/Reference #: [id number]

## 2023-10-14 ENCOUNTER — Encounter: Payer: Self-pay | Admitting: Internal Medicine

## 2023-10-14 NOTE — Telephone Encounter (Signed)
 Lets see if we can appeal.  She has been very stable on the combination of the extended release Xanax and the short acting Xanax.  Psychiatry put her on this several years ago and this has worked well for her

## 2023-10-15 ENCOUNTER — Other Ambulatory Visit: Payer: Self-pay

## 2023-10-15 MED ORDER — PRAVASTATIN SODIUM 80 MG PO TABS: 80.0000 mg | ORAL_TABLET | Freq: Every evening | ORAL | 0 refills | Status: DC

## 2023-10-15 NOTE — Telephone Encounter (Signed)
 Letter completed and my-chart sent to [name] letting her know process.

## 2023-10-21 NOTE — Telephone Encounter (Signed)
 Can you call her daughter and let her know this.  1 option is to replace the extended release alprazolam  with a very low-dose clonazepam  which is the same type of medication and long-acting.  Ideally we just need to be very careful because I do not want her taking too much of the alprazolam  or short acting Xanax  on top of the clonazepam  which can cause drowsiness and cognitive issues.  In the ideal world would be better on clonazepam  twice a day and no alprazolam , but I am not sure if she would go for that.

## 2023-10-21 NOTE — Telephone Encounter (Signed)
 My-chart sent to [name].  Waiting for response on how she would like to proceed.

## 2023-10-21 NOTE — Telephone Encounter (Signed)
 Insurance redetermination of denial

## 2023-10-21 NOTE — Telephone Encounter (Signed)
 Letter for appeals faxed on yesterday and conformation received.

## 2023-10-22 NOTE — Telephone Encounter (Signed)
 Last read by Dyann Glaser at 9:15AM on 10/22/2023.

## 2023-11-11 ENCOUNTER — Other Ambulatory Visit: Payer: Self-pay | Admitting: Internal Medicine

## 2023-11-11 DIAGNOSIS — C7931 Secondary malignant neoplasm of brain: Secondary | ICD-10-CM

## 2023-11-13 ENCOUNTER — Ambulatory Visit: Payer: Self-pay

## 2023-11-13 NOTE — Telephone Encounter (Signed)
 Chief Complaint:  Low blood pressure   Symptoms:  -Dizziness in the morning and at night. Dissipates later.   Frequency:  Onset: This week   Patient denies  Disposition: [ ] ED /[ ] Urgent Care (no appt availability in office) /  [ X]Appointment(In office/virtual)/ [ ]  [facility]/ [ ] Home Care/ [ ] Refused Recommended Disposition /[ ]  Mobile Bus/ [ ]  Follow-up with PCP   Additional Notes:  Patient referred to PCP appt. Scheduled for Monday 5/19 appropriately. Patient educated appropriately of home care and s/s that need them to go to [facility].   Complete triage note below:   Copied from CRM [id number]. Topic: Clinical - Red Word Triage >> Nov 13, 2023  4:42 PM Alyse July wrote: Red Word that prompted transfer to Nurse Triage: Low Blood Pressure Reason for Disposition  Brief (now gone) dizziness or lightheadedness after standing up or eating  Answer Assessment - Initial Assessment Questions 1. BLOOD PRESSURE: "What is the blood pressure?" "Did you take at least two measurements 5 minutes apart?"      ---- (3:50pm 11/13/23),123/51 HR 61, 129/50 HR: 60  2. ONSET: "When did you take your blood pressure?"     ---- This week .    3. HOW: "How did you obtain the blood pressure?" (e.g., visiting nurse, automatic home BP monitor)     -- Blood pressure monitor ( at home)    4. HISTORY: "Do you have a history of low blood pressure?" "What is your blood pressure normally?"     ---- Secondary HTN, d/t smoking. Once she stopped smoking, BP meds were discontinued.    5. MEDICINES: "Are you taking any medications for blood pressure?" If Yes, ask: "Have they been changed recently?"  -------- No    6. PULSE RATE: "Do you know what your pulse rate is?"     See question 1. Unable to see it currently.    7. OTHER SYMPTOMS: "Have you been sick recently?" "Have you had a recent injury?" ----This morning: "Feels her heart beating"-- Too fast  ----Slightly lightheaded this  morning ---Dizzy: In the morning and at night  Pravastain has been increased Linzest: Dose was increased  Protocols used: Blood Pressure - Low-A-AH

## 2023-11-14 ENCOUNTER — Ambulatory Visit
Admission: RE | Admit: 2023-11-14 | Discharge: 2023-11-14 | Disposition: A | Discharge status: Home / Self Care | Source: Ambulatory Visit

## 2023-11-14 VITALS — BP 151/78 | HR 65 | Temp 98.1°F | Resp 18

## 2023-11-14 DIAGNOSIS — H9203 Otalgia, bilateral: Secondary | ICD-10-CM | POA: Diagnosis not present

## 2023-11-14 DIAGNOSIS — R42 Dizziness and giddiness: Secondary | ICD-10-CM

## 2023-11-14 MED ORDER — DOXYCYCLINE HYCLATE 100 MG PO CAPS: 100.0000 mg | ORAL_CAPSULE | Freq: Two times a day (BID) | ORAL | 0 refills | Status: DC

## 2023-11-14 MED ORDER — DOXYCYCLINE CALCIUM 50 MG/5ML PO SYRP: 100.0000 mg | ORAL_SOLUTION | Freq: Two times a day (BID) | ORAL | 0 refills | Status: AC

## 2023-11-14 NOTE — ED Provider Notes (Signed)
 Hannah [name]    CSN: [id number] Arrival date & time: 11/14/23  1316      History   Chief Complaint Chief Complaint  Patient presents with   Dizziness    Heart seems to be beating fast, Low Blood Pressure  readings (bottom number129/50 60 Pulse - Entered by patient    HPI Hannah [name] is a 73 y.o. female.   Pt reports episodes of her heart beating fast and feeling jittery that have been going on for about a week.  Reports these episodes occur when she is at rest and only last for a few minutes.  Denies chest pain or exertional sx. She reports low blood pressures at home, unsure of readings but states the diastolic's have been 70-80.  Reports heart rate at home in the 60s.  States she sometimes feels lightheaded, no associated with palpitations.  She does follow with cardio.  She has an appointment with PCP on Monday.  She also complains of facial pain and pressure, congestion, and bilateral ear pain that started about one week ago.  Has tried otc sinus and cold medications with minimal relief.  Denies fever, chills, cough, shortness of breath or wheezing.     Past Medical History:  Diagnosis Date   Allergy    Anemia    Anxiety    Arthritis    Carpal tunnel syndrome    Constipation    senna C stool softeners help    Depression    Diverticulosis    Dyslipidemia    External hemorrhoids    GERD (gastroesophageal reflux disease)    Heart murmur    mild-moderate AR   Hiatal hernia    Hyperlipidemia    on meds    Hypertension    Internal hemorrhoids    lung ca with brain mets 07/2020   Osteoarthritis    Pre-diabetes    PVD (peripheral vascular disease) (HCC)    moderate carotid disease   RBBB    Smoker    Vocal cord polyps     Patient Active Problem List   Diagnosis Date Noted   Rapid heart beat 07/27/2023   Port-A-Cath in place 08/14/2022   Achilles tendonitis 06/27/2022   Rash 06/27/2022   Nasal congestion 03/21/2022   PAD (peripheral artery  disease) (HCC) 03/08/2022   Bilateral shoulder pain 12/11/2021   Fatigue 12/11/2021   Metastatic lung cancer (metastasis from lung to other site) [facility]) 09/18/2021   Chest pain 08/30/2021   Urinary frequency 08/30/2021   Acute cystitis 07/16/2021   Vasogenic brain edema (HCC) 04/22/2021   Seizure (HCC) 04/22/2021   Nonrheumatic aortic valve insufficiency 04/18/2021   Allergic rhinitis 02/21/2021   Encounter for antineoplastic chemotherapy 10/23/2020   Encounter for antineoplastic immunotherapy 10/23/2020   Anemia 10/19/2020   Sleep difficulties 10/19/2020   S/P robot-assisted surgical procedure 09/24/2020   Aortic atherosclerosis (HCC) 09/12/2020   Status post craniotomy 08/24/2020   Rectal bleeding 08/17/2020   Primary cancer of right upper lobe of lung (HCC) 08/07/2020   Primary malignant neoplasm of lung with metastasis to brain (HCC) 08/06/2020   Memory changes 07/24/2020   Non-recurrent unilateral inguinal hernia without obstruction or gangrene 07/24/2020   Pain of right clavicle 09/12/2019   Nonspecific abnormal electrocardiogram (ECG) (EKG) 10/20/2018   Depression 07/17/2018   Primary osteoarthritis of right knee 06/04/2018   Bilateral carotid artery stenosis 09/24/2017   Aortic valve sclerosis 09/24/2017   Vocal cord polyps 09/07/2017   Diabetes (HCC) 03/09/2017   GERD (  gastroesophageal reflux disease) 02/27/2016   Anxiety 02/27/2016   S/P total knee replacement 02/13/2014   RBBB 02/08/2014   PVD - bilateral 60-79% carotid strenosis 02/08/2014   Mixed hyperlipidemia 11/29/2013   Carpal tunnel syndrome, bilateral 11/23/2013   Murmur- mild -mod AR, mild MR 11/10/2013   Constipation 12/16/2012    Past Surgical History:  Procedure Laterality Date   ABDOMINAL HYSTERECTOMY  1994   APPLICATION OF CRANIAL NAVIGATION N/A 08/24/2020   Procedure: APPLICATION OF CRANIAL NAVIGATION;  Surgeon: Cannon Champion, MD;  Location: [facility] OR;  Service: Neurosurgery;  Laterality: N/A;    COLONOSCOPY     COLONOSCOPY W/ POLYPECTOMY  2009   CRANIOTOMY Left 08/24/2020   Procedure: Left Craniotomy for Tumor Resection with Brainlab;  Surgeon: Cannon Champion, MD;  Location: [facility] OR;  Service: Neurosurgery;  Laterality: Left;   HEMORRHOID SURGERY     INTERCOSTAL NERVE BLOCK Right 09/24/2020   Procedure: INTERCOSTAL NERVE BLOCK;  Surgeon: Zelphia Higashi, MD;  Location: [facility] OR;  Service: Thoracic;  Laterality: Right;   IR IMAGING GUIDED PORT INSERTION  05/15/2022   JOINT REPLACEMENT     LYMPH NODE DISSECTION Right 09/24/2020   Procedure: LYMPH NODE DISSECTION;  Surgeon: Zelphia Higashi, MD;  Location: [facility] OR;  Service: Thoracic;  Laterality: Right;   POLYPECTOMY     right total knee arthroplasty     Dr. Dara Ear 06-04-18   SPINE SURGERY  08/16/2009   TOTAL KNEE ARTHROPLASTY Left 02/13/2014   Procedure: TOTAL KNEE ARTHROPLASTY;  Surgeon: Boston Byers, MD;  Location: [facility] OR;  Service: Orthopedics;  Laterality: Left;   TOTAL KNEE ARTHROPLASTY Right 06/04/2018   Procedure: RIGHT TOTAL KNEE ARTHROPLASTY;  Surgeon: Neil Balls, MD;  Location: [facility] ORS;  Service: Orthopedics;  Laterality: Right;  Adductor Block   TUBAL LIGATION      OB History   No obstetric history on file.      Home Medications    Prior to Admission medications   Medication Sig Start Date End Date Taking? Authorizing Provider  doxycycline  (VIBRAMYCIN ) 50 MG/5ML SYRP Take 10 mLs (100 mg total) by mouth 2 (two) times daily for 5 days. 11/14/23 11/19/23 Yes Ward, Char Common, PA-C  FLUZONE HIGH-DOSE 0.5 ML injection  08/13/23  Yes [provider]  acetaminophen  (TYLENOL ) 325 MG tablet Take 325 mg by mouth every 6 (six) hours as needed for moderate pain.    [provider]  ALPRAZolam  (XANAX  XR) 0.5 MG 24 hr tablet Take 1 tablet (0.5 mg total) by mouth every morning. 09/21/23   Burns, Beckey Bourgeois, MD  ALPRAZolam  (XANAX ) 0.5 MG tablet Take 1 tablet (0.5 mg total) by mouth 3 (three) times daily as  needed. for anxiety 09/21/23   Colene Dauphin, MD  aspirin  81 MG tablet Take 1 tablet (81 mg total) by mouth daily. Restart on 08/31/20 08/25/20   Cannon Champion, MD  bisacodyl  (DULCOLAX) 5 MG EC tablet Take 5 mg by mouth daily as needed for moderate constipation.    [provider]  carboxymethylcellulose (REFRESH PLUS) 0.5 % SOLN Place 1-2 drops into both eyes 3 (three) times daily as needed (dry eyes).    [provider]  Cholecalciferol (D3 PO) Take 1 capsule by mouth daily.    [provider]  citalopram  (CELEXA ) 10 MG tablet Take 1 tablet (10 mg total) by mouth daily. 03/24/23   Colene Dauphin, MD  fluticasone  (FLONASE ) 50 MCG/ACT nasal spray Place 2 sprays into both nostrils  daily. 09/21/23   Colene Dauphin, MD  folic acid  (FOLVITE ) 1 MG tablet Take 1 tablet by mouth once daily 11/11/23   Vaslow, Zachary K, MD  HYDROcodone -acetaminophen  (HYCET) 7.5-325 mg/15 ml solution 15 ml every 6 hours as needed for moderate to severe pain 06/14/23   Wynetta Heckle, MD  hydrocortisone  (ANUSOL -HC) 25 MG suppository Place 1 suppository (25 mg total) rectally 2 (two) times daily. 09/21/23   Colene Dauphin, MD  ketoconazole  (NIZORAL ) 2 % cream APPLY 1 APPLICATION TOPICALLY DAILY 11/07/22   Roslyn Coombe, MD  Lacosamide  100 MG TABS Take 1 tablet (100 mg total) by mouth 2 (two) times daily. 08/27/23   Vaslow, Zachary K, MD  levETIRAcetam  (KEPPRA ) 100 MG/ML solution TAKE 5 ML BY MOUTH  TWICE DAILY 08/27/23   Vaslow, Zachary K, MD  lidocaine -prilocaine  (EMLA ) cream Apply 1 Application topically as needed. 05/01/22   Heilingoetter, Cassandra L, PA-C  linaclotide  (LINZESS ) 145 MCG CAPS capsule Take 1 capsule (145 mcg total) by mouth daily before breakfast. 09/17/23   Graciella Lavender, PA  loratadine  (CLARITIN ) 10 MG tablet Take 10 mg by mouth daily as needed for allergies.    [provider]  magnesium  hydroxide (MILK OF MAGNESIA) 800 MG/5ML suspension Take 30 mLs by mouth daily as  needed for constipation.    [provider]  Menthol , Topical Analgesic, (BIOFREEZE EX) Apply 1 application topically as needed (pain).    [provider]  mirtazapine  (REMERON ) 15 MG tablet Take 1 tablet (15 mg total) by mouth at bedtime. 07/27/23   Colene Dauphin, MD  olopatadine  (PATANOL) 0.1 % ophthalmic solution Place 1 drop into both eyes 2 (two) times daily as needed for allergies.    [provider]  omeprazole  (PRILOSEC) 40 MG capsule Take 1 capsule (40 mg total) by mouth daily. 09/21/23   Colene Dauphin, MD  polyethylene glycol (MIRALAX  / GLYCOLAX ) 17 g packet Take 17 g by mouth 2 (two) times daily. 08/19/20   Armenta Landau, MD  pravastatin  (PRAVACHOL ) 80 MG tablet Take 1 tablet (80 mg total) by mouth every evening. 10/15/23   Colene Dauphin, MD  Probiotic Product (PROBIOTIC DAILY PO) Take 1 capsule by mouth daily.    [provider]  prochlorperazine  (COMPAZINE ) 10 MG tablet TAKE 1 TABLET BY MOUTH EVERY 6 HOURS AS NEEDED FOR NAUSEA OR VOMITING 11/09/21   Marlene Simas, MD  simethicone  (MYLICON) 80 MG chewable tablet Chew 1 tablet (80 mg total) by mouth 4 (four) times daily as needed for flatulence (Bloating). 09/28/20   Von Grumbling, PA-C    Family History Family History  Problem Relation Age of Onset   Cancer Father    Prostate cancer Father    Diabetes Sister    Cancer Sister    Stroke Maternal Grandfather    Colitis Maternal Aunt    Breast cancer Maternal Aunt    Colon cancer Neg Hx    Colon polyps Neg Hx    Rectal cancer Neg Hx    Stomach cancer Neg Hx    Esophageal cancer Neg Hx     Social History Social History   Tobacco Use   Smoking status: Former    Current packs/day: 0.00    Average packs/day: 0.3 packs/day for 40.0 years (10.0 ttl pk-yrs)    Types: Cigarettes    Start date: 07/09/1980    Quit date: 07/09/2020    Years since quitting: 3.3    Passive exposure: Never  Smokeless tobacco: Never   Tobacco comments:     PACK WILL LAST 3 days  Vaping Use   Vaping status: Never Used  Substance Use Topics   Alcohol  use: No   Drug use: No     Allergies   Amlodipine, Chantix [varenicline tartrate], Clarithromycin, Lisinopril, Simvastatin, Wellbutrin  [bupropion  hcl], Lipitor [atorvastatin], and Sertraline    Review of Systems Review of Systems  Constitutional:  Negative for chills and fever.  HENT:  Positive for congestion and ear pain. Negative for sore throat.   Eyes:  Negative for pain and visual disturbance.  Respiratory:  Negative for cough and shortness of breath.   Cardiovascular:  Positive for palpitations. Negative for chest pain.  Gastrointestinal:  Negative for abdominal pain and vomiting.  Genitourinary:  Negative for dysuria and hematuria.  Musculoskeletal:  Negative for arthralgias and back pain.  Skin:  Negative for color change and rash.  Neurological:  Positive for light-headedness. Negative for seizures and syncope.  All other systems reviewed and are negative.    Physical Exam Triage Vital Signs ED Triage Vitals  Encounter Vitals Group     BP 11/14/23 1331 (!) 151/78     Systolic BP Percentile --      Diastolic BP Percentile --      Pulse Rate 11/14/23 1331 65     Resp 11/14/23 1331 18     Temp 11/14/23 1331 98.1 F (36.7 C)     Temp Source 11/14/23 1331 Oral     SpO2 11/14/23 1331 96 %     Weight --      Height --      Head Circumference --      Peak Flow --      Pain Score 11/14/23 1332 0     Pain Loc --      Pain Education --      Exclude from Growth Chart --    No data found.  Updated Vital Signs BP (!) 151/78 (BP Location: Right Arm)   Pulse 65   Temp 98.1 F (36.7 C) (Oral)   Resp 18   SpO2 96%   Visual Acuity Right Eye Distance:   Left Eye Distance:   Bilateral Distance:    Right Eye Near:   Left Eye Near:    Bilateral Near:     Physical Exam Vitals and nursing note reviewed.  Constitutional:      General: She is not in acute distress.     Appearance: She is well-developed.  HENT:     Head: Normocephalic and atraumatic.  Eyes:     Conjunctiva/sclera: Conjunctivae normal.  Cardiovascular:     Rate and Rhythm: Normal rate and regular rhythm.     Heart sounds: No murmur heard. Pulmonary:     Effort: Pulmonary effort is normal. No respiratory distress.     Breath sounds: Normal breath sounds.  Abdominal:     Palpations: Abdomen is soft.     Tenderness: There is no abdominal tenderness.  Musculoskeletal:        General: No swelling.     Cervical back: Neck supple.  Skin:    General: Skin is warm and dry.     Capillary Refill: Capillary refill takes less than 2 seconds.  Neurological:     Mental Status: She is alert.  Psychiatric:        Mood and Affect: Mood normal.      [name] Treatments / Results  Labs (all labs ordered are listed, but only abnormal  results are displayed) Labs Reviewed - No data to display  EKG   Radiology No results found.  Procedures Procedures (including critical care time)  Medications Ordered in [name] Medications - No data to display  Initial Impression / Assessment and Plan / [name] Course  I have reviewed the triage vital signs and the nursing notes.  Pertinent labs & imaging results that were available during my care of the patient were reviewed by me and considered in my medical decision making (see chart for details).     Given bilateral ear pain and sinus pain and pressure will treat for sinusitis.  This may be contributing to dizziness.   Pt overall well appearing in no acute distress. Vitals wnl today in clinic.  Denies chest pain or exertional sx.  EKG unchanged.  Advised follow up with cardiology and to keep a log of her blood pressure readings.   Final Clinical Impressions(s) / [name] Diagnoses   Final diagnoses:  Ear pain, bilateral  Dizziness and giddiness   Discharge Instructions      Take antibiotic as prescribed Recommend flonase  Drink plenty of fluids  Your EKG is  normal today, your blood pressure and heart rate are normal.   Please keep a log of your blood pressure and follow up with cardiology  ED Prescriptions     Medication Sig Dispense Auth. Provider   doxycycline  (VIBRAMYCIN ) 100 MG capsule  (Status: Discontinued) Take 1 capsule (100 mg total) by mouth 2 (two) times daily for 5 days. 10 capsule Ward, Phoebe Marter Z, PA-C   doxycycline  (VIBRAMYCIN ) 50 MG/5ML SYRP Take 10 mLs (100 mg total) by mouth 2 (two) times daily for 5 days. 200 mL Ward, Char Common, PA-C      PDMP not reviewed this encounter.   Ward, Char Common, PA-C 11/14/23 [phone]

## 2023-11-14 NOTE — Discharge Instructions (Addendum)
 Take antibiotic as prescribed Recommend flonase  Drink plenty of fluids  Your EKG is normal today, your blood pressure and heart rate are normal.   Please keep a log of your blood pressure and follow up with cardiology

## 2023-11-14 NOTE — ED Triage Notes (Addendum)
 Patient presents to UC for low BP readings and feeling "jittery." States her daughter is concerned with low HR of 60 bpm. Spoke with triage nurse, has appt scheduled with PCP Monday. No pain.

## 2023-11-16 ENCOUNTER — Telehealth: Payer: Self-pay

## 2023-11-16 ENCOUNTER — Ambulatory Visit: Admitting: Family Medicine

## 2023-11-16 NOTE — Telephone Encounter (Signed)
 Spoke with [name] today (daughter)

## 2023-11-16 NOTE — Telephone Encounter (Signed)
 Copied from CRM [id number]. Topic: Appointments - Scheduling Inquiry for Clinic >> Nov 13, 2023  2:53 PM Hannah [name] wrote: Reason for CRM: Pt called to see if there was any availability for today ( pt wanted to be seen for low heart rate/jittery)- confirm there was not, offer to NT pt to speak to nurse, pt decline and state she would call back

## 2023-11-20 ENCOUNTER — Telehealth: Payer: Self-pay

## 2023-11-20 ENCOUNTER — Other Ambulatory Visit: Payer: Self-pay

## 2023-11-20 ENCOUNTER — Inpatient Hospital Stay: Attending: Internal Medicine

## 2023-11-20 VITALS — BP 133/66 | HR 73 | Temp 98.1°F

## 2023-11-20 DIAGNOSIS — C3411 Malignant neoplasm of upper lobe, right bronchus or lung: Secondary | ICD-10-CM | POA: Insufficient documentation

## 2023-11-20 DIAGNOSIS — Z95828 Presence of other vascular implants and grafts: Secondary | ICD-10-CM

## 2023-11-20 DIAGNOSIS — C3491 Malignant neoplasm of unspecified part of right bronchus or lung: Secondary | ICD-10-CM

## 2023-11-20 DIAGNOSIS — C3490 Malignant neoplasm of unspecified part of unspecified bronchus or lung: Secondary | ICD-10-CM

## 2023-11-20 MED ORDER — HEPARIN SOD (PORK) LOCK FLUSH 100 UNIT/ML IV SOLN
500.0000 [IU] | Freq: Once | INTRAVENOUS | Status: AC
  Administered 2023-11-20: 500 [IU]

## 2023-11-20 MED ORDER — SODIUM CHLORIDE 0.9% FLUSH
10.0000 mL | Freq: Once | INTRAVENOUS | Status: AC
  Administered 2023-11-20: 10 mL

## 2023-11-20 NOTE — Telephone Encounter (Signed)
 Received telephone call from the patient's daughter inquiring appointment for port flush. Patient is due for port flush appt. Transferred call to scheduler.

## 2023-12-01 ENCOUNTER — Encounter: Payer: Self-pay | Admitting: Internal Medicine

## 2023-12-01 ENCOUNTER — Ambulatory Visit (INDEPENDENT_AMBULATORY_CARE_PROVIDER_SITE_OTHER): Admitting: Internal Medicine

## 2023-12-01 VITALS — BP 130/70 | HR 62 | Temp 98.0°F | Ht 60.0 in | Wt 128.0 lb

## 2023-12-01 DIAGNOSIS — R202 Paresthesia of skin: Secondary | ICD-10-CM | POA: Insufficient documentation

## 2023-12-01 DIAGNOSIS — M159 Polyosteoarthritis, unspecified: Secondary | ICD-10-CM | POA: Insufficient documentation

## 2023-12-01 MED ORDER — METHYLPREDNISOLONE ACETATE 40 MG/ML IJ SUSP
40.0000 mg | Freq: Once | INTRAMUSCULAR | Status: AC
  Administered 2023-12-01: 40 mg via INTRAMUSCULAR

## 2023-12-01 NOTE — Progress Notes (Signed)
 Subjective:    Patient ID: Hannah [name], female    DOB: [birth year], 73 y.o.   MRN: [id number]      HPI Hannah [name] is here for  Chief Complaint  Patient presents with   Toe Pain    Left big toe pain   Hand Pain    Bilateral hand pain   B/l hand swollen x 3 days .  The swelling improved last night.  She does use Aspercreme daily and that helps.  She does have some left thumb pain at the MCP joint  Tingling, needlesticking her feeling x 1 month in left toes.  Intemitttent x 1 month.  She denies any numbness or tingling or pain in her left leg.  She denies back pain.  She denies any of the sensation in her right foot.   Soreness all over.  All of her joints hurt and her muscles hurt.   Medications and allergies reviewed with patient and updated if appropriate.  Current Outpatient Medications on File Prior to Visit  Medication Sig Dispense Refill   acetaminophen  (TYLENOL ) 325 MG tablet Take 325 mg by mouth every 6 (six) hours as needed for moderate pain.     ALPRAZolam  (XANAX  XR) 0.5 MG 24 hr tablet Take 1 tablet (0.5 mg total) by mouth every morning. 30 tablet 5   ALPRAZolam  (XANAX ) 0.5 MG tablet Take 1 tablet (0.5 mg total) by mouth 3 (three) times daily as needed. for anxiety 90 tablet 5   aspirin  81 MG tablet Take 1 tablet (81 mg total) by mouth daily. Restart on 08/31/20 30 tablet    bisacodyl  (DULCOLAX) 5 MG EC tablet Take 5 mg by mouth daily as needed for moderate constipation.     carboxymethylcellulose (REFRESH PLUS) 0.5 % SOLN Place 1-2 drops into both eyes 3 (three) times daily as needed (dry eyes).     Cholecalciferol (D3 PO) Take 1 capsule by mouth daily.     citalopram  (CELEXA ) 10 MG tablet Take 1 tablet (10 mg total) by mouth daily. 90 tablet 1   fluticasone  (FLONASE ) 50 MCG/ACT nasal spray Place 2 sprays into both nostrils daily. 48 g 3   FLUZONE HIGH-DOSE 0.5 ML injection      folic acid  (FOLVITE ) 1 MG tablet Take 1 tablet by mouth once daily 30 tablet 0    HYDROcodone -acetaminophen  (HYCET) 7.5-325 mg/15 ml solution 15 ml every 6 hours as needed for moderate to severe pain 150 mL 0   hydrocortisone  (ANUSOL -HC) 25 MG suppository Place 1 suppository (25 mg total) rectally 2 (two) times daily. 12 suppository 0   ketoconazole  (NIZORAL ) 2 % cream APPLY 1 APPLICATION TOPICALLY DAILY 30 g 0   Lacosamide  100 MG TABS Take 1 tablet (100 mg total) by mouth 2 (two) times daily. 60 tablet 3   levETIRAcetam  (KEPPRA ) 100 MG/ML solution TAKE 5 ML BY MOUTH  TWICE DAILY 473 mL 2   lidocaine -prilocaine  (EMLA ) cream Apply 1 Application topically as needed. 30 g 2   linaclotide  (LINZESS ) 145 MCG CAPS capsule Take 1 capsule (145 mcg total) by mouth daily before breakfast. 30 capsule 6   loratadine  (CLARITIN ) 10 MG tablet Take 10 mg by mouth daily as needed for allergies.     magnesium  hydroxide (MILK OF MAGNESIA) 800 MG/5ML suspension Take 30 mLs by mouth daily as needed for constipation.     Menthol , Topical Analgesic, (BIOFREEZE EX) Apply 1 application topically as needed (pain).     mirtazapine  (REMERON ) 15 MG tablet Take 1  tablet (15 mg total) by mouth at bedtime. 90 tablet 1   olopatadine  (PATANOL) 0.1 % ophthalmic solution Place 1 drop into both eyes 2 (two) times daily as needed for allergies.     omeprazole  (PRILOSEC) 40 MG capsule Take 1 capsule (40 mg total) by mouth daily. 90 capsule 1   polyethylene glycol (MIRALAX  / GLYCOLAX ) 17 g packet Take 17 g by mouth 2 (two) times daily. 72 each 0   pravastatin  (PRAVACHOL ) 80 MG tablet Take 1 tablet (80 mg total) by mouth every evening. 90 tablet 0   Probiotic Product (PROBIOTIC DAILY PO) Take 1 capsule by mouth daily.     prochlorperazine  (COMPAZINE ) 10 MG tablet TAKE 1 TABLET BY MOUTH EVERY 6 HOURS AS NEEDED FOR NAUSEA OR VOMITING 30 tablet 0   simethicone  (MYLICON) 80 MG chewable tablet Chew 1 tablet (80 mg total) by mouth 4 (four) times daily as needed for flatulence (Bloating). 30 tablet 0   No current  facility-administered medications on file prior to visit.    Review of Systems  Musculoskeletal:  Positive for arthralgias and myalgias. Negative for back pain.  Neurological:  Positive for numbness.       Objective:   Vitals:   12/01/23 1523  BP: 130/70  Pulse: 62  Temp: 98 F (36.7 C)  SpO2: 97%   BP Readings from Last 3 Encounters:  12/01/23 130/70  11/20/23 133/66  11/14/23 (!) 151/78   Wt Readings from Last 3 Encounters:  12/01/23 128 lb (58.1 kg)  10/07/23 132 lb 3.2 oz (60 kg)  09/21/23 135 lb (61.2 kg)   Body mass index is 25 kg/m.    Physical Exam Constitutional:      General: She is not in acute distress.    Appearance: Normal appearance. She is normal weight. She is not ill-appearing.  HENT:     Head: Normocephalic and atraumatic.  Musculoskeletal:        General: Tenderness (Mild tenderness finger joints, left MCP joint of thumb) and deformity (Some of her DIP joints have arthritic changes) present. No swelling.     Right lower leg: No edema.     Left lower leg: No edema.  Skin:    General: Skin is warm and dry.     Findings: No erythema or rash.  Neurological:     Mental Status: She is alert.     Sensory: No sensory deficit (Left toes/foot).            Assessment & Plan:    See Problem List for Assessment and Plan of chronic medical problems.

## 2023-12-01 NOTE — Patient Instructions (Addendum)
   Steroid injection given today    Start taking tylenol  arthritis twice a day   For your hand pain - it is likely osteoarthritis --  start tylenol , use the Aspercreme.  You can take advil on occasion for the pain but do not take it regularly.

## 2023-12-01 NOTE — Assessment & Plan Note (Signed)
 Acute Having painful tingling, pinprick sensation in her left toes and ball of foot Discomfort is intermittent and started about 1 month ago No back pain, no left leg pain, no similar sensation in her right foot Discussed that this was nerve pain Discussed that we could do further evaluation with neurology if she wishes-she will monitor for now and let me know if her symptoms persist I can refer

## 2023-12-01 NOTE — Assessment & Plan Note (Signed)
 Chronic Diffuse arthritis of bilateral hands Recent flare-both hands were swollen, but this has improved Aspercreme helps-continue Advised that she can take Tylenol  as needed or regularly for the pain Given degree of pain that she is not needed is diffuse we will give her Depo-Medrol  40 mg IM today per her request

## 2023-12-06 ENCOUNTER — Other Ambulatory Visit: Payer: Self-pay | Admitting: Internal Medicine

## 2023-12-06 DIAGNOSIS — C7931 Secondary malignant neoplasm of brain: Secondary | ICD-10-CM

## 2023-12-09 ENCOUNTER — Encounter: Payer: Self-pay | Admitting: Internal Medicine

## 2023-12-14 ENCOUNTER — Other Ambulatory Visit: Payer: Self-pay | Admitting: Internal Medicine

## 2023-12-14 DIAGNOSIS — D496 Neoplasm of unspecified behavior of brain: Secondary | ICD-10-CM

## 2023-12-17 ENCOUNTER — Ambulatory Visit
Admission: RE | Admit: 2023-12-17 | Discharge: 2023-12-17 | Discharge status: Home / Self Care | Payer: PPO | Source: Ambulatory Visit | Attending: Internal Medicine

## 2023-12-17 DIAGNOSIS — D496 Neoplasm of unspecified behavior of brain: Secondary | ICD-10-CM

## 2023-12-17 DIAGNOSIS — G9389 Other specified disorders of brain: Secondary | ICD-10-CM | POA: Diagnosis not present

## 2023-12-17 DIAGNOSIS — Z9221 Personal history of antineoplastic chemotherapy: Secondary | ICD-10-CM | POA: Diagnosis not present

## 2023-12-17 MED ORDER — GADOPICLENOL 0.5 MMOL/ML IV SOLN
6.0000 mL | Freq: Once | INTRAVENOUS | Status: AC | PRN
  Administered 2023-12-17: 6 mL via INTRAVENOUS

## 2023-12-21 ENCOUNTER — Encounter: Payer: Self-pay | Admitting: Internal Medicine

## 2023-12-22 ENCOUNTER — Other Ambulatory Visit: Payer: Self-pay

## 2023-12-22 DIAGNOSIS — R300 Dysuria: Secondary | ICD-10-CM

## 2023-12-23 ENCOUNTER — Other Ambulatory Visit: Payer: Self-pay | Admitting: Internal Medicine

## 2023-12-23 ENCOUNTER — Other Ambulatory Visit (INDEPENDENT_AMBULATORY_CARE_PROVIDER_SITE_OTHER)

## 2023-12-23 DIAGNOSIS — R300 Dysuria: Secondary | ICD-10-CM | POA: Diagnosis not present

## 2023-12-23 LAB — URINALYSIS, ROUTINE W REFLEX MICROSCOPIC
Bilirubin Urine: NEGATIVE
Ketones, ur: NEGATIVE
Nitrite: NEGATIVE
Specific Gravity, Urine: <=1.005 — AB (ref 1.000–1.030)
Total Protein, Urine: NEGATIVE
Urine Glucose: NEGATIVE
Urobilinogen, UA: 0.2 (ref 0.0–1.0)
pH: 6.5 (ref 5.0–8.0)

## 2023-12-24 ENCOUNTER — Ambulatory Visit: Payer: Self-pay | Admitting: Internal Medicine

## 2023-12-24 ENCOUNTER — Inpatient Hospital Stay: Payer: PPO | Attending: Internal Medicine | Admitting: Internal Medicine

## 2023-12-24 DIAGNOSIS — C7931 Secondary malignant neoplasm of brain: Secondary | ICD-10-CM | POA: Diagnosis not present

## 2023-12-24 DIAGNOSIS — I10 Essential (primary) hypertension: Secondary | ICD-10-CM | POA: Diagnosis not present

## 2023-12-24 DIAGNOSIS — F32A Depression, unspecified: Secondary | ICD-10-CM | POA: Diagnosis not present

## 2023-12-24 DIAGNOSIS — E785 Hyperlipidemia, unspecified: Secondary | ICD-10-CM | POA: Insufficient documentation

## 2023-12-24 DIAGNOSIS — F419 Anxiety disorder, unspecified: Secondary | ICD-10-CM | POA: Diagnosis not present

## 2023-12-24 DIAGNOSIS — I739 Peripheral vascular disease, unspecified: Secondary | ICD-10-CM | POA: Diagnosis not present

## 2023-12-24 DIAGNOSIS — Z8719 Personal history of other diseases of the digestive system: Secondary | ICD-10-CM | POA: Diagnosis not present

## 2023-12-24 DIAGNOSIS — Z90710 Acquired absence of both cervix and uterus: Secondary | ICD-10-CM | POA: Insufficient documentation

## 2023-12-24 DIAGNOSIS — Z79899 Other long term (current) drug therapy: Secondary | ICD-10-CM | POA: Diagnosis not present

## 2023-12-24 DIAGNOSIS — C3411 Malignant neoplasm of upper lobe, right bronchus or lung: Secondary | ICD-10-CM | POA: Insufficient documentation

## 2023-12-24 DIAGNOSIS — C3490 Malignant neoplasm of unspecified part of unspecified bronchus or lung: Secondary | ICD-10-CM | POA: Diagnosis not present

## 2023-12-24 DIAGNOSIS — M1990 Unspecified osteoarthritis, unspecified site: Secondary | ICD-10-CM | POA: Insufficient documentation

## 2023-12-24 DIAGNOSIS — R531 Weakness: Secondary | ICD-10-CM | POA: Insufficient documentation

## 2023-12-24 DIAGNOSIS — Z888 Allergy status to other drugs, medicaments and biological substances status: Secondary | ICD-10-CM | POA: Diagnosis not present

## 2023-12-24 DIAGNOSIS — Z87891 Personal history of nicotine dependence: Secondary | ICD-10-CM | POA: Diagnosis not present

## 2023-12-24 MED ORDER — CEPHALEXIN 500 MG PO CAPS: 500.0000 mg | ORAL_CAPSULE | Freq: Two times a day (BID) | ORAL | 0 refills | Status: DC

## 2023-12-24 NOTE — Progress Notes (Signed)
 [facility] at [facility] [address]  [place], [place] [address] [phone]   Interval Evaluation  Date of Service: 12/24/23 Patient Name: Hannah [name] Patient MRN: [id number] Patient DOB: 04/17/1951 Provider: Arthea MARLA Manns, MD  Identifying Statement:  Hannah [name] is a 73 y.o. female with brain metastasis   Primary Cancer:  Oncology History  Primary cancer of right upper lobe of lung (HCC)  08/07/2020 Initial Diagnosis   Primary cancer of right upper lobe of lung (HCC)   09/20/2020 Cancer Staging   Staging form: Lung, AJCC 8th Edition - Clinical: Stage IVB (cT3, cN0, cM1c) - Signed by Sherrod Sherrod, MD on 09/20/2020   11/08/2020 - 01/16/2022 Chemotherapy   Patient is on Treatment Plan : LUNG CARBOplatin  / Pemetrexed  / Pembrolizumab  q21d Induction x 4 cycles / Maintenance Pemetrexed  + Pembrolizumab      11/16/2020 - 11/27/2022 Chemotherapy   Patient is on Treatment Plan : LUNG Carboplatin  (5) + Pemetrexed  (500) + Pembrolizumab  (200) D1 q21d Induction x 4 cycles / Maintenance Pemetrexed  (500) + Pembrolizumab  (200) D1 q21d      Oncologic History 08/23/20: Pre-op SRS to L frontal mass [facility]) 08/24/20: Craniotomy, resection (Ostergard) 11/29/20: Salvage SRS x2 [facility]) 10/14/21: L frontoparietal progression, undergoes LITT at [facility]) 11/18/21: Path is adenocarcinoma, completes 25/5 SRS [facility])  Interval History:  Hannah [name] presents today for follow up after recent MRI brain. No clinical changes reported today.  She continues on keppra  500mg  BID and vimpat  100mg  BID without further seizures.  No further decadron .  Continues on observation with Dr. Sherrod.  H+P (08/02/20) Patient presented to medical attention this past week with several days rapidly progressive speech impairment and right sided weakness.  She and her family describe difficulty putting complete sentences together, impaired use of right arm (for using fork, zipper,  brushing teeth), and dragging of the right leg while walking.  She never needed assistance to walk.  CNS imaging demonstrated left frontal mass, and decadron  was started over the weekend 4mg  twice per day.  She feels improved with regards to the right sided weakness with the steroids.  Otherwise no seizures, headaches.  Medications: Current Outpatient Medications on File Prior to Visit  Medication Sig Dispense Refill   acetaminophen  (TYLENOL ) 325 MG tablet Take 325 mg by mouth every 6 (six) hours as needed for moderate pain.     ALPRAZolam  (XANAX  XR) 0.5 MG 24 hr tablet Take 1 tablet (0.5 mg total) by mouth every morning. 30 tablet 5   ALPRAZolam  (XANAX ) 0.5 MG tablet Take 1 tablet (0.5 mg total) by mouth 3 (three) times daily as needed. for anxiety 90 tablet 5   aspirin  81 MG tablet Take 1 tablet (81 mg total) by mouth daily. Restart on 08/31/20 30 tablet    bisacodyl  (DULCOLAX) 5 MG EC tablet Take 5 mg by mouth daily as needed for moderate constipation.     carboxymethylcellulose (REFRESH PLUS) 0.5 % SOLN Place 1-2 drops into both eyes 3 (three) times daily as needed (dry eyes).     Cholecalciferol (D3 PO) Take 1 capsule by mouth daily.     citalopram  (CELEXA ) 10 MG tablet Take 1 tablet (10 mg total) by mouth daily. 90 tablet 1   fluticasone  (FLONASE ) 50 MCG/ACT nasal spray Place 2 sprays into both nostrils daily. 48 g 3   folic acid  (FOLVITE ) 1 MG tablet Take 1 tablet by mouth once daily 30 tablet 0   HYDROcodone -acetaminophen  (HYCET) 7.5-325 mg/15 ml  solution 15 ml every 6 hours as needed for moderate to severe pain 150 mL 0   hydrocortisone  (ANUSOL -HC) 25 MG suppository Place 1 suppository (25 mg total) rectally 2 (two) times daily. 12 suppository 0   ketoconazole  (NIZORAL ) 2 % cream APPLY 1 APPLICATION TOPICALLY DAILY 30 g 0   Lacosamide  100 MG TABS Take 1 tablet (100 mg total) by mouth 2 (two) times daily. 60 tablet 3   levETIRAcetam  (KEPPRA ) 100 MG/ML solution TAKE 5 ML BY MOUTH  TWICE DAILY  473 mL 0   lidocaine -prilocaine  (EMLA ) cream Apply 1 Application topically as needed. 30 g 2   linaclotide  (LINZESS ) 145 MCG CAPS capsule Take 1 capsule (145 mcg total) by mouth daily before breakfast. 30 capsule 6   loratadine  (CLARITIN ) 10 MG tablet Take 10 mg by mouth daily as needed for allergies.     magnesium  hydroxide (MILK OF MAGNESIA) 800 MG/5ML suspension Take 30 mLs by mouth daily as needed for constipation.     Menthol , Topical Analgesic, (BIOFREEZE EX) Apply 1 application topically as needed (pain).     mirtazapine  (REMERON ) 15 MG tablet Take 1 tablet (15 mg total) by mouth at bedtime. 90 tablet 1   olopatadine  (PATANOL) 0.1 % ophthalmic solution Place 1 drop into both eyes 2 (two) times daily as needed for allergies.     omeprazole  (PRILOSEC) 40 MG capsule Take 1 capsule (40 mg total) by mouth daily. 90 capsule 1   polyethylene glycol (MIRALAX  / GLYCOLAX ) 17 g packet Take 17 g by mouth 2 (two) times daily. 72 each 0   pravastatin  (PRAVACHOL ) 80 MG tablet Take 1 tablet (80 mg total) by mouth every evening. 90 tablet 0   Probiotic Product (PROBIOTIC DAILY PO) Take 1 capsule by mouth daily.     prochlorperazine  (COMPAZINE ) 10 MG tablet TAKE 1 TABLET BY MOUTH EVERY 6 HOURS AS NEEDED FOR NAUSEA OR VOMITING 30 tablet 0   simethicone  (MYLICON) 80 MG chewable tablet Chew 1 tablet (80 mg total) by mouth 4 (four) times daily as needed for flatulence (Bloating). 30 tablet 0   No current facility-administered medications on file prior to visit.    Allergies:  Allergies  Allergen Reactions   Amlodipine Swelling and Rash    Rash, swelling   Chantix [Varenicline Tartrate] Shortness Of Breath, Swelling and Other (See Comments)    Tongue swell,sob   Clarithromycin Rash   Lisinopril Hives   Simvastatin Hives   Wellbutrin  [Bupropion  Hcl] Hives   Lipitor [Atorvastatin]     Dizziness per patient   Sertraline      Makes her feel like she is going to kill someone   Past Medical History:   Past Medical History:  Diagnosis Date   Allergy    Anemia    Anxiety    Arthritis    Carpal tunnel syndrome    Constipation    senna C stool softeners help    Depression    Diverticulosis    Dyslipidemia    External hemorrhoids    GERD (gastroesophageal reflux disease)    Heart murmur    mild-moderate AR   Hiatal hernia    Hyperlipidemia    on meds    Hypertension    Internal hemorrhoids    lung ca with brain mets 07/2020   Osteoarthritis    Pre-diabetes    PVD (peripheral vascular disease) (HCC)    moderate carotid disease   RBBB    Smoker    Vocal cord polyps  Past Surgical History:  Past Surgical History:  Procedure Laterality Date   ABDOMINAL HYSTERECTOMY  1994   APPLICATION OF CRANIAL NAVIGATION N/A 08/24/2020   Procedure: APPLICATION OF CRANIAL NAVIGATION;  Surgeon: Cheryle Debby LABOR, MD;  Location: [facility] OR;  Service: Neurosurgery;  Laterality: N/A;   COLONOSCOPY     COLONOSCOPY W/ POLYPECTOMY  2009   CRANIOTOMY Left 08/24/2020   Procedure: Left Craniotomy for Tumor Resection with Brainlab;  Surgeon: Cheryle Debby LABOR, MD;  Location[facility] OR;  Service: Neurosurgery;  Laterality: Left;   HEMORRHOID SURGERY     INTERCOSTAL NERVE BLOCK Right 09/24/2020   Procedure: INTERCOSTAL NERVE BLOCK;  Surgeon: Kerrin Elspeth BROCKS, MD;  Location: [facility] OR;  Service: Thoracic;  Laterality: Right;   IR IMAGING GUIDED PORT INSERTION  05/15/2022   JOINT REPLACEMENT     LYMPH NODE DISSECTION Right 09/24/2020   Procedure: LYMPH NODE DISSECTION;  Surgeon: Kerrin Elspeth BROCKS, MD;  Location: [facility] OR;  Service: Thoracic;  Laterality: Right;   POLYPECTOMY     right total knee arthroplasty     Dr. Johann 06-04-18   SPINE SURGERY  08/16/2009   TOTAL KNEE ARTHROPLASTY Left 02/13/2014   Procedure: TOTAL KNEE ARTHROPLASTY;  Surgeon: Norleen LITTIE Gavel, MD;  Location: [facility] OR;  Service: Orthopedics;  Laterality: Left;   TOTAL KNEE ARTHROPLASTY Right 06/04/2018   Procedure: RIGHT TOTAL KNEE  ARTHROPLASTY;  Surgeon: Gavel Norleen, MD;  Location: [facility] ORS;  Service: Orthopedics;  Laterality: Right;  Adductor Block   TUBAL LIGATION     Social History:  Social History   Socioeconomic History   Marital status: Married    Spouse name: Not on file   Number of children: 2   Years of education: Not on file   Highest education level: 12th grade  Occupational History   Not on file  Tobacco Use   Smoking status: Former    Current packs/day: 0.00    Average packs/day: 0.3 packs/day for 40.0 years (10.0 ttl pk-yrs)    Types: Cigarettes    Start date: 07/09/1980    Quit date: 07/09/2020    Years since quitting: 3.4    Passive exposure: Never   Smokeless tobacco: Never   Tobacco comments:    PACK WILL LAST 3 days  Vaping Use   Vaping status: Never Used  Substance and Sexual Activity   Alcohol  use: No   Drug use: No   Sexual activity: Not Currently  Other Topics Concern   Not on file  Social History Narrative   Not on file   Social Drivers of Health   Financial Resource Strain: Low Risk  (07/26/2023)   Overall Financial Resource Strain (CARDIA)    Difficulty of Paying Living Expenses: Not hard at all  Food Insecurity: No Food Insecurity (07/26/2023)   Hunger Vital Sign    Worried About Running Out of Food in the Last Year: Never true    Ran Out of Food in the Last Year: Never true  Transportation Needs: No Transportation Needs (07/26/2023)   PRAPARE - Administrator, Civil Service (Medical): No    Lack of Transportation (Non-Medical): No  Physical Activity: Insufficiently Active (07/26/2023)   Exercise Vital Sign    Days of Exercise per Week: 1 day    Minutes of Exercise per Session: 20 min  Stress: No Stress Concern Present (07/26/2023)   [employer] of Occupational Health - Occupational Stress Questionnaire    Feeling of Stress : Only a little  Social  Connections: Socially Integrated (07/26/2023)   Social Connection and Isolation Panel    Frequency of  Communication with Friends and Family: More than three times a week    Frequency of Social Gatherings with Friends and Family: Once a week    Attends Religious Services: More than 4 times per year    Active Member of Clubs or Organizations: Yes    Attends Banker Meetings: More than 4 times per year    Marital Status: Married  Catering manager Violence: Not At Risk (04/16/2023)   Humiliation, Afraid, Rape, and Kick questionnaire    Fear of Current or Ex-Partner: No    Emotionally Abused: No    Physically Abused: No    Sexually Abused: No   Family History:  Family History  Problem Relation Age of Onset   Cancer Father    Prostate cancer Father    Diabetes Sister    Cancer Sister    Stroke Maternal Grandfather    Colitis Maternal Aunt    Breast cancer Maternal Aunt    Colon cancer Neg Hx    Colon polyps Neg Hx    Rectal cancer Neg Hx    Stomach cancer Neg Hx    Esophageal cancer Neg Hx     Review of Systems: Constitutional: Doesn't report fevers, chills or abnormal weight loss Eyes: Doesn't report blurriness of vision Ears, nose, mouth, throat, and face: Doesn't report sore throat Respiratory: Doesn't report cough, dyspnea or wheezes Cardiovascular: Doesn't report palpitation, chest discomfort  Gastrointestinal:  Doesn't report nausea, constipation, diarrhea GU: Doesn't report incontinence Skin: Doesn't report skin rashes Neurological: Per HPI Musculoskeletal: Doesn't report joint pain Behavioral/Psych: ++anxiety  Physical Exam: Wt Readings from Last 3 Encounters:  12/01/23 128 lb (58.1 kg)  10/07/23 132 lb 3.2 oz (60 kg)  09/21/23 135 lb (61.2 kg)   Temp Readings from Last 3 Encounters:  12/01/23 98 F (36.7 C) (Oral)  11/20/23 98.1 F (36.7 C) (Oral)  11/14/23 98.1 F (36.7 C) (Oral)   BP Readings from Last 3 Encounters:  12/01/23 130/70  11/20/23 133/66  11/14/23 (!) 151/78   Pulse Readings from Last 3 Encounters:  12/01/23 62  11/20/23 73   11/14/23 65     KPS: 80. General: Alert, cooperative, pleasant, in no acute distress Head: Normal EENT: No conjunctival injection or scleral icterus.  Lungs: Resp effort normal Cardiac: Regular rate Abdomen: Non-distended abdomen Skin: No rashes cyanosis or petechiae. Extremities: No clubbing or edema  Neurologic Exam: Mental Status: Awake, alert, attentive to examiner. Oriented to self and environment. Language is intact with regards to fluency, comprehension.  Psychomotor slowing, impaired recall. Cranial Nerves: Visual acuity is grossly normal. Visual fields are full. Extra-ocular movements intact. No ptosis. Face is symmetric Motor: Tone and bulk are normal. Power is 5/5 throughout. Reflexes are symmetric, no pathologic reflexes present.  Sensory: Intact to light touch Gait: Independent  Labs: I have reviewed the data as listed    Component Value Date/Time   NA 140 09/21/2023 1024   K 4.0 09/21/2023 1024   CL 106 09/21/2023 1024   CO2 31 09/21/2023 1024   GLUCOSE 109 (H) 09/21/2023 1024   BUN 10 09/21/2023 1024   CREATININE 0.60 09/21/2023 1024   CREATININE 0.79 03/14/2020 1016   CALCIUM  9.5 09/21/2023 1024   PROT 6.5 09/21/2023 1024   ALBUMIN 3.8 09/21/2023 1024   AST 18 09/21/2023 1024   ALT 9 09/21/2023 1024   ALKPHOS 83 09/21/2023 1024   BILITOT  0.2 09/21/2023 1024   GFRNONAA >60 09/21/2023 1024   GFRNONAA 75 03/14/2020 1016   GFRAA 87 03/14/2020 1016   Lab Results  Component Value Date   WBC 4.5 09/21/2023   NEUTROABS 2.0 09/21/2023   HGB 10.8 (L) 09/21/2023   HCT 34.7 (L) 09/21/2023   MCV 83.2 09/21/2023   PLT 203 09/21/2023    Imaging:  [facility] Clinician Interpretation: I have personally reviewed the CNS images as listed.  My interpretation, in the context of the patient's clinical presentation, is stable disease  MR BRAIN W WO CONTRAST Result Date: 12/17/2023 CLINICAL DATA:  Primary lung cancer with metastasis. Craniotomy and initial [facility] February  2022. Follow-up [facility] June 2022. LITT left frontal parietal lobe April 2023. [facility] May 2023. Multiple cycles of chemotherapy. EXAM: MRI HEAD WITHOUT AND WITH CONTRAST TECHNIQUE: Multiplanar, multiecho pulse sequences of the brain and surrounding structures were obtained without and with intravenous contrast. CONTRAST:  6 mL Vueway  COMPARISON:  MR head without and with contrast 08/25/2023, 04/21/2023. FINDINGS: Brain: The patient is status post left frontal craniotomy. The lesion in the anterior left middle frontal gyrus is stable, measuring 15 mm maximally on axial images. Surrounding vasogenic edema is stable to slightly decreased. A lesion in the right temporal lobe measures 7 mm maximally on image 90 of series 14. It measured 8 mm previously. The surrounding vasogenic edema has increased. A previously noted more inferior posterior right temporal lobe lesion is no longer visible. The inferior scratched at the left parietooccipital lobe lesion on image 86 of series 14 continues to decrease in size, now measuring 15 x 16 mm. The lesion measured 18 x 20 mm previously. Surrounding vasogenic edema continues to decrease. Periventricular white matter changes are otherwise stable. Generalized atrophy is similar the prior study. Midline structures are within normal limits. The brainstem and cerebellum are within normal limits. Vascular: Flow is present in the major intracranial arteries. Skull and upper cervical spine: The craniocervical junction is normal. Upper cervical spine is within normal limits. Marrow signal is unremarkable. Sinuses/Orbits: Bilateral lens replacements are noted. Globes and orbits are otherwise unremarkable. Mild mucosal thickening is present the maxillary sinuses, inferior ethmoid air cells bilaterally. No fluid levels are present. Small mastoid effusions are present. No obstructing nasopharyngeal lesion is present. IMPRESSION: 1. Continued decrease in size of the left parietooccipital lobe lesion with  decreasing surrounding vasogenic edema. 2. Stable lesion in the anterior left middle frontal gyrus with stable to slightly decreased surrounding vasogenic edema. 3. Stable to slight decrease in size of the right temporal lobe lesion with increasing surrounding vasogenic edema. 4. A previously noted more inferior posterior right temporal lobe lesion is no longer visible. 5. Stable generalized atrophy and white matter disease. This likely reflects the sequela of chronic microvascular ischemia. 6. Mild paranasal sinus disease. 7. Small mastoid effusions bilaterally. No obstructing nasopharyngeal lesion is present. Electronically Signed   By: Lonni Necessary M.D.   On: 12/17/2023 11:34      Assessment/Plan Primary malignant neoplasm of lung with metastasis to brain [facility])  Hannah [name] is clinically stable today.  MRI demonstrates stable findings today.   Will continue MRI surveillance at this time.  May dose 800mg  ibuprofen for acute headaches.  Recommended continuing Vimpat  100mg  BID.  May reduce Keppra  to 250mg  BID liquid.  We appreciate the opportunity to participate in the care of Hannah [name].    We ask that Hannah [name] return to clinic in 6 months following next brain  MRI, or sooner as needed.  All questions were answered. The patient knows to call the clinic with any problems, questions or concerns. No barriers to learning were detected.  The total time spent in the encounter was 40 minutes and more than 50% was on counseling and review of test results   Arthea MARLA Manns, MD Medical Director of Neuro-Oncology [facility] [place] [facility] 12/24/23 9:23 AM

## 2023-12-25 LAB — CULTURE, URINE COMPREHENSIVE

## 2024-01-08 ENCOUNTER — Inpatient Hospital Stay: Attending: Internal Medicine

## 2024-01-08 VITALS — BP 132/75 | HR 65 | Temp 98.0°F

## 2024-01-08 DIAGNOSIS — Z79899 Other long term (current) drug therapy: Secondary | ICD-10-CM | POA: Insufficient documentation

## 2024-01-08 DIAGNOSIS — C7931 Secondary malignant neoplasm of brain: Secondary | ICD-10-CM | POA: Insufficient documentation

## 2024-01-08 DIAGNOSIS — Z452 Encounter for adjustment and management of vascular access device: Secondary | ICD-10-CM | POA: Insufficient documentation

## 2024-01-08 DIAGNOSIS — C3411 Malignant neoplasm of upper lobe, right bronchus or lung: Secondary | ICD-10-CM | POA: Insufficient documentation

## 2024-01-08 DIAGNOSIS — C3490 Malignant neoplasm of unspecified part of unspecified bronchus or lung: Secondary | ICD-10-CM

## 2024-01-08 DIAGNOSIS — C3491 Malignant neoplasm of unspecified part of right bronchus or lung: Secondary | ICD-10-CM

## 2024-01-08 DIAGNOSIS — Z95828 Presence of other vascular implants and grafts: Secondary | ICD-10-CM

## 2024-01-08 MED ORDER — SODIUM CHLORIDE 0.9% FLUSH
10.0000 mL | Freq: Once | INTRAVENOUS | Status: AC
Start: 2024-01-08 — End: 2024-01-08
  Administered 2024-01-08: 10 mL

## 2024-01-08 MED ORDER — HEPARIN SOD (PORK) LOCK FLUSH 100 UNIT/ML IV SOLN
500.0000 [IU] | Freq: Once | INTRAVENOUS | Status: AC
  Administered 2024-01-08: 500 [IU]

## 2024-01-10 ENCOUNTER — Other Ambulatory Visit: Payer: Self-pay | Admitting: Internal Medicine

## 2024-01-10 DIAGNOSIS — C3490 Malignant neoplasm of unspecified part of unspecified bronchus or lung: Secondary | ICD-10-CM

## 2024-01-11 ENCOUNTER — Encounter: Payer: Self-pay | Admitting: Internal Medicine

## 2024-01-11 ENCOUNTER — Other Ambulatory Visit: Payer: Self-pay | Admitting: Internal Medicine

## 2024-01-11 DIAGNOSIS — C3490 Malignant neoplasm of unspecified part of unspecified bronchus or lung: Secondary | ICD-10-CM

## 2024-01-12 ENCOUNTER — Encounter: Payer: Self-pay | Admitting: Internal Medicine

## 2024-01-17 ENCOUNTER — Other Ambulatory Visit: Payer: Self-pay | Admitting: Internal Medicine

## 2024-01-20 DIAGNOSIS — Z961 Presence of intraocular lens: Secondary | ICD-10-CM | POA: Diagnosis not present

## 2024-01-20 DIAGNOSIS — H538 Other visual disturbances: Secondary | ICD-10-CM | POA: Diagnosis not present

## 2024-01-25 ENCOUNTER — Inpatient Hospital Stay

## 2024-01-25 ENCOUNTER — Ambulatory Visit (HOSPITAL_COMMUNITY)
Admission: RE | Admit: 2024-01-25 | Discharge: 2024-01-25 | Disposition: A | Discharge status: Home / Self Care | Source: Ambulatory Visit | Attending: Internal Medicine | Admitting: Internal Medicine

## 2024-01-25 DIAGNOSIS — K760 Fatty (change of) liver, not elsewhere classified: Secondary | ICD-10-CM | POA: Diagnosis not present

## 2024-01-25 DIAGNOSIS — C3491 Malignant neoplasm of unspecified part of right bronchus or lung: Secondary | ICD-10-CM

## 2024-01-25 DIAGNOSIS — C3411 Malignant neoplasm of upper lobe, right bronchus or lung: Secondary | ICD-10-CM | POA: Diagnosis not present

## 2024-01-25 DIAGNOSIS — C3490 Malignant neoplasm of unspecified part of unspecified bronchus or lung: Secondary | ICD-10-CM | POA: Insufficient documentation

## 2024-01-25 DIAGNOSIS — K5730 Diverticulosis of large intestine without perforation or abscess without bleeding: Secondary | ICD-10-CM | POA: Diagnosis not present

## 2024-01-25 DIAGNOSIS — C3412 Malignant neoplasm of upper lobe, left bronchus or lung: Secondary | ICD-10-CM | POA: Diagnosis not present

## 2024-01-25 DIAGNOSIS — Z95828 Presence of other vascular implants and grafts: Secondary | ICD-10-CM

## 2024-01-25 LAB — CMP (CANCER CENTER ONLY)
ALT: 9 U/L (ref 0–44)
AST: 17 U/L (ref 15–41)
Albumin: 3.8 g/dL (ref 3.5–5.0)
Alkaline Phosphatase: 99 U/L (ref 38–126)
Anion gap: 5 (ref 5–15)
BUN: 9 mg/dL (ref 8–23)
CO2: 30 mmol/L (ref 22–32)
Calcium: 9.5 mg/dL (ref 8.9–10.3)
Chloride: 105 mmol/L (ref 98–111)
Creatinine: 0.8 mg/dL (ref 0.44–1.00)
GFR, Estimated: >60 mL/min (ref >60)
Glucose, Bld: 109 mg/dL — ABNORMAL HIGH (ref 70–99)
Potassium: 4 mmol/L (ref 3.5–5.1)
Sodium: 140 mmol/L (ref 135–145)
Total Bilirubin: 0.3 mg/dL (ref 0.0–1.2)
Total Protein: 6.8 g/dL (ref 6.5–8.1)

## 2024-01-25 LAB — CBC WITH DIFFERENTIAL (CANCER CENTER ONLY)
Abs Immature Granulocytes: 0.01 K/uL (ref 0.00–0.07)
Basophils Absolute: 0 K/uL (ref 0.0–0.1)
Basophils Relative: 1 %
Eosinophils Absolute: 0.1 K/uL (ref 0.0–0.5)
Eosinophils Relative: 2 %
HCT: 33.9 % — ABNORMAL LOW (ref 36.0–46.0)
Hemoglobin: 10.6 g/dL — ABNORMAL LOW (ref 12.0–15.0)
Immature Granulocytes: 0 %
Lymphocytes Relative: 38 %
Lymphs Abs: 1.7 K/uL (ref 0.7–4.0)
MCH: 25.4 pg — ABNORMAL LOW (ref 26.0–34.0)
MCHC: 31.3 g/dL (ref 30.0–36.0)
MCV: 81.3 fL (ref 80.0–100.0)
Monocytes Absolute: 0.4 K/uL (ref 0.1–1.0)
Monocytes Relative: 10 %
Neutro Abs: 2.3 K/uL (ref 1.7–7.7)
Neutrophils Relative %: 49 %
Platelet Count: 211 K/uL (ref 150–400)
RBC: 4.17 MIL/uL (ref 3.87–5.11)
RDW: 13.5 % (ref 11.5–15.5)
WBC Count: 4.6 K/uL (ref 4.0–10.5)
nRBC: 0 % (ref 0.0–0.2)

## 2024-01-25 MED ORDER — IOHEXOL 300 MG/ML  SOLN
100.0000 mL | Freq: Once | INTRAMUSCULAR | Status: AC | PRN
  Administered 2024-01-25: 100 mL via INTRAVENOUS

## 2024-01-25 MED ORDER — SODIUM CHLORIDE 0.9% FLUSH
10.0000 mL | Freq: Once | INTRAVENOUS | Status: AC
Start: 2024-01-25 — End: 2024-01-25
  Administered 2024-01-25: 10 mL

## 2024-01-25 MED ORDER — HEPARIN SOD (PORK) LOCK FLUSH 100 UNIT/ML IV SOLN
500.0000 [IU] | Freq: Once | INTRAVENOUS | Status: AC
  Administered 2024-01-25: 500 [IU] via INTRAVENOUS

## 2024-01-29 ENCOUNTER — Other Ambulatory Visit: Payer: Self-pay | Admitting: Physician Assistant

## 2024-01-29 ENCOUNTER — Telehealth: Payer: Self-pay | Admitting: Medical Oncology

## 2024-01-29 DIAGNOSIS — C3490 Malignant neoplasm of unspecified part of unspecified bronchus or lung: Secondary | ICD-10-CM

## 2024-01-29 NOTE — Telephone Encounter (Signed)
Confirmed appt for next week.

## 2024-02-01 ENCOUNTER — Telehealth: Payer: Self-pay | Admitting: Internal Medicine

## 2024-02-01 ENCOUNTER — Inpatient Hospital Stay: Attending: Internal Medicine | Admitting: Internal Medicine

## 2024-02-01 VITALS — BP 139/69 | HR 71 | Temp 97.8°F | Resp 16 | Ht 60.0 in | Wt 125.0 lb

## 2024-02-01 DIAGNOSIS — M8580 Other specified disorders of bone density and structure, unspecified site: Secondary | ICD-10-CM | POA: Diagnosis not present

## 2024-02-01 DIAGNOSIS — F32A Depression, unspecified: Secondary | ICD-10-CM | POA: Insufficient documentation

## 2024-02-01 DIAGNOSIS — C3490 Malignant neoplasm of unspecified part of unspecified bronchus or lung: Secondary | ICD-10-CM

## 2024-02-01 DIAGNOSIS — K5730 Diverticulosis of large intestine without perforation or abscess without bleeding: Secondary | ICD-10-CM | POA: Diagnosis not present

## 2024-02-01 DIAGNOSIS — R5383 Other fatigue: Secondary | ICD-10-CM | POA: Diagnosis not present

## 2024-02-01 DIAGNOSIS — Z79899 Other long term (current) drug therapy: Secondary | ICD-10-CM | POA: Insufficient documentation

## 2024-02-01 DIAGNOSIS — R0789 Other chest pain: Secondary | ICD-10-CM | POA: Insufficient documentation

## 2024-02-01 DIAGNOSIS — K760 Fatty (change of) liver, not elsewhere classified: Secondary | ICD-10-CM | POA: Insufficient documentation

## 2024-02-01 DIAGNOSIS — F419 Anxiety disorder, unspecified: Secondary | ICD-10-CM | POA: Insufficient documentation

## 2024-02-01 DIAGNOSIS — Z9226 Personal history of immune checkpoint inhibitor therapy: Secondary | ICD-10-CM | POA: Insufficient documentation

## 2024-02-01 DIAGNOSIS — Z90710 Acquired absence of both cervix and uterus: Secondary | ICD-10-CM | POA: Diagnosis not present

## 2024-02-01 DIAGNOSIS — Z87891 Personal history of nicotine dependence: Secondary | ICD-10-CM | POA: Insufficient documentation

## 2024-02-01 DIAGNOSIS — E785 Hyperlipidemia, unspecified: Secondary | ICD-10-CM | POA: Diagnosis not present

## 2024-02-01 DIAGNOSIS — C3411 Malignant neoplasm of upper lobe, right bronchus or lung: Secondary | ICD-10-CM | POA: Insufficient documentation

## 2024-02-01 DIAGNOSIS — Z888 Allergy status to other drugs, medicaments and biological substances status: Secondary | ICD-10-CM | POA: Diagnosis not present

## 2024-02-01 DIAGNOSIS — M4317 Spondylolisthesis, lumbosacral region: Secondary | ICD-10-CM | POA: Insufficient documentation

## 2024-02-01 DIAGNOSIS — N261 Atrophy of kidney (terminal): Secondary | ICD-10-CM | POA: Insufficient documentation

## 2024-02-01 DIAGNOSIS — C7931 Secondary malignant neoplasm of brain: Secondary | ICD-10-CM | POA: Insufficient documentation

## 2024-02-01 DIAGNOSIS — I739 Peripheral vascular disease, unspecified: Secondary | ICD-10-CM | POA: Diagnosis not present

## 2024-02-01 DIAGNOSIS — I700 Atherosclerosis of aorta: Secondary | ICD-10-CM | POA: Insufficient documentation

## 2024-02-01 DIAGNOSIS — Z8719 Personal history of other diseases of the digestive system: Secondary | ICD-10-CM | POA: Diagnosis not present

## 2024-02-01 NOTE — Progress Notes (Signed)
 [facility] [phone]   [phone]  OFFICE PROGRESS NOTE  Hannah Glade PARAS, MD [address] [place] [address]  DIAGNOSIS: Stage IV non-small cell lung cancer (T3, N0, M1C) adenocarcinoma.  The patient presented with a and solitary brain metastasis.  The patient was diagnosed in February 2022.  Biomarker Findings Microsatellite status - MS-Stable Tumor Mutational Burden - 8 Muts/Mb Genomic Findings For a complete list of the genes assayed, please refer to the Appendix. KRAS G12V KEAP1 S224F TP53 P151T 7 Disease relevant genes with no reportable alterations: ALK, BRAF, EGFR, ERBB2, MET, RET, ROS1  PDL1 Expression: 50%    PRIOR THERAPY: 1) SRS to the metastatic brain lesion on 08/23/2020 under the care of Dr. Patrcia and craniotomy under the care of Dr. Cheryle scheduled for 08/24/2020. 2) S/p robotic assisted right upper lobectomy with en bloc wedge resection of the right middle lobe and lymph node dissection under the care of Dr. Kerrin on September 24, 2020. 3) status post SRS to a new subcentimeter brain lesions under the care of Dr. Dewey. 4) SRS to brain metastasis. 5) Systemic chemotherapy with carboplatin  for AUC of 5, Alimta  500 Mg/M2 and Keytruda  200 mg IV every 3 weeks.  First dose Nov 07, 2020.  Status post 35 cycles.  Starting from cycle #5 the patient will be on maintenance treatment with Alimta  and Keytruda  every 3 weeks.   CURRENT THERAPY: Observation.  INTERVAL HISTORY: Hannah [name] 73 y.o. female returns to the clinic today for follow-up visit accompanied by her daughter. Discussed the use of AI scribe software for clinical note transcription with the patient, who gave verbal consent to proceed.  History of Present Illness Hannah [name] is a 73 year old female with stage four non-small cell lung cancer who presents for evaluation with repeat CT scan for restaging of her disease. She is accompanied by her  daughter.  She was diagnosed with stage four non-small cell lung cancer, adenocarcinoma, in February 2022. The cancer has no actionable mutations and a PD-L1 expression of 50%. Initially, she underwent systemic chemotherapy with carboplatin , Alimta , and Keytruda  for four cycles, followed by 35 cycles of maintenance treatment with Alimta  and Keytruda . This treatment was discontinued a year ago, and she is currently under observation.  She experiences occasional central chest pain, described as 'far and few'. No nausea, vomiting, diarrhea, or headaches are present, but she reports significant fatigue.  She continues to take seizure medication, which has been reduced, and remains alert most of the time. She also takes folic acid  regularly.  No new complaints, chest pain, breathing issues, or cough. Occasional central chest pain and significant fatigue are noted. No nausea, vomiting, diarrhea, or headaches.    MEDICAL HISTORY: Past Medical History:  Diagnosis Date   Allergy    Anemia    Anxiety    Arthritis    Carpal tunnel syndrome    Constipation    senna C stool softeners help    Depression    Diverticulosis    Dyslipidemia    External hemorrhoids    GERD (gastroesophageal reflux disease)    Heart murmur    mild-moderate AR   Hiatal hernia    Hyperlipidemia    on meds    Hypertension    Internal hemorrhoids    lung ca with brain mets 07/2020   Osteoarthritis    Pre-diabetes    PVD (peripheral vascular disease) (HCC)    moderate carotid  disease   RBBB    Smoker    Vocal cord polyps     ALLERGIES:  is allergic to amlodipine, chantix [varenicline tartrate], clarithromycin, lisinopril, simvastatin, wellbutrin  [bupropion  hcl], lipitor [atorvastatin], and sertraline .  MEDICATIONS:  Current Outpatient Medications  Medication Sig Dispense Refill   acetaminophen  (TYLENOL ) 325 MG tablet Take 325 mg by mouth every 6 (six) hours as needed for moderate pain.     ALPRAZolam  (XANAX   XR) 0.5 MG 24 hr tablet Take 1 tablet (0.5 mg total) by mouth every morning. 30 tablet 5   ALPRAZolam  (XANAX ) 0.5 MG tablet Take 1 tablet (0.5 mg total) by mouth 3 (three) times daily as needed. for anxiety 90 tablet 5   aspirin  81 MG tablet Take 1 tablet (81 mg total) by mouth daily. Restart on 08/31/20 30 tablet    bisacodyl  (DULCOLAX) 5 MG EC tablet Take 5 mg by mouth daily as needed for moderate constipation.     carboxymethylcellulose (REFRESH PLUS) 0.5 % SOLN Place 1-2 drops into both eyes 3 (three) times daily as needed (dry eyes).     cephALEXin  (KEFLEX ) 500 MG capsule Take 1 capsule (500 mg total) by mouth 2 (two) times daily. 14 capsule 0   Cholecalciferol (D3 PO) Take 1 capsule by mouth daily.     citalopram  (CELEXA ) 10 MG tablet Take 1 tablet by mouth once daily 90 tablet 0   fluticasone  (FLONASE ) 50 MCG/ACT nasal spray Place 2 sprays into both nostrils daily. 48 g 3   folic acid  (FOLVITE ) 1 MG tablet Take 1 tablet by mouth once daily 30 tablet 5   HYDROcodone -acetaminophen  (HYCET) 7.5-325 mg/15 ml solution 15 ml every 6 hours as needed for moderate to severe pain 150 mL 0   hydrocortisone  (ANUSOL -HC) 25 MG suppository Place 1 suppository (25 mg total) rectally 2 (two) times daily. 12 suppository 0   ketoconazole  (NIZORAL ) 2 % cream APPLY 1 APPLICATION TOPICALLY DAILY 30 g 0   Lacosamide  100 MG TABS Take 1 tablet by mouth twice daily 60 tablet 0   levETIRAcetam  (KEPPRA ) 100 MG/ML solution TAKE 5 ML BY MOUTH  TWICE DAILY 473 mL 0   lidocaine -prilocaine  (EMLA ) cream Apply 1 Application topically as needed. 30 g 2   linaclotide  (LINZESS ) 145 MCG CAPS capsule Take 1 capsule (145 mcg total) by mouth daily before breakfast. 30 capsule 6   loratadine  (CLARITIN ) 10 MG tablet Take 10 mg by mouth daily as needed for allergies.     magnesium  hydroxide (MILK OF MAGNESIA) 800 MG/5ML suspension Take 30 mLs by mouth daily as needed for constipation.     Menthol , Topical Analgesic, (BIOFREEZE EX) Apply  1 application topically as needed (pain).     mirtazapine  (REMERON ) 15 MG tablet Take 1 tablet (15 mg total) by mouth at bedtime. 90 tablet 1   olopatadine  (PATANOL) 0.1 % ophthalmic solution Place 1 drop into both eyes 2 (two) times daily as needed for allergies.     omeprazole  (PRILOSEC) 40 MG capsule Take 1 capsule (40 mg total) by mouth daily. 90 capsule 1   polyethylene glycol (MIRALAX  / GLYCOLAX ) 17 g packet Take 17 g by mouth 2 (two) times daily. 72 each 0   pravastatin  (PRAVACHOL ) 80 MG tablet Take 1 tablet by mouth in the evening 90 tablet 0   Probiotic Product (PROBIOTIC DAILY PO) Take 1 capsule by mouth daily.     prochlorperazine  (COMPAZINE ) 10 MG tablet TAKE 1 TABLET BY MOUTH EVERY 6 HOURS AS NEEDED FOR NAUSEA OR  VOMITING 30 tablet 0   simethicone  (MYLICON) 80 MG chewable tablet Chew 1 tablet (80 mg total) by mouth 4 (four) times daily as needed for flatulence (Bloating). 30 tablet 0   No current facility-administered medications for this visit.    SURGICAL HISTORY:  Past Surgical History:  Procedure Laterality Date   ABDOMINAL HYSTERECTOMY  1994   APPLICATION OF CRANIAL NAVIGATION N/A 08/24/2020   Procedure: APPLICATION OF CRANIAL NAVIGATION;  Surgeon: Cheryle Debby LABOR, MD;  Location: [facility] OR;  Service: Neurosurgery;  Laterality: N/A;   COLONOSCOPY     COLONOSCOPY W/ POLYPECTOMY  2009   CRANIOTOMY Left 08/24/2020   Procedure: Left Craniotomy for Tumor Resection with Brainlab;  Surgeon: Cheryle Debby LABOR, MD;  Location: [facility] OR;  Service: Neurosurgery;  Laterality: Left;   HEMORRHOID SURGERY     INTERCOSTAL NERVE BLOCK Right 09/24/2020   Procedure: INTERCOSTAL NERVE BLOCK;  Surgeon: Kerrin Elspeth BROCKS, MD;  Location: [facility] OR;  Service: Thoracic;  Laterality: Right;   IR IMAGING GUIDED PORT INSERTION  05/15/2022   JOINT REPLACEMENT     LYMPH NODE DISSECTION Right 09/24/2020   Procedure: LYMPH NODE DISSECTION;  Surgeon: Kerrin Elspeth BROCKS, MD;  Location: [facility] OR;  Service:  Thoracic;  Laterality: Right;   POLYPECTOMY     right total knee arthroplasty     Dr. Johann 06-04-18   SPINE SURGERY  08/16/2009   TOTAL KNEE ARTHROPLASTY Left 02/13/2014   Procedure: TOTAL KNEE ARTHROPLASTY;  Surgeon: Norleen LITTIE Gavel, MD;  Location: [facility] OR;  Service: Orthopedics;  Laterality: Left;   TOTAL KNEE ARTHROPLASTY Right 06/04/2018   Procedure: RIGHT TOTAL KNEE ARTHROPLASTY;  Surgeon: Gavel Norleen, MD;  Location: [facility] ORS;  Service: Orthopedics;  Laterality: Right;  Adductor Block   TUBAL LIGATION      REVIEW OF SYSTEMS:  Constitutional: positive for fatigue Eyes: negative Ears, nose, mouth, throat, and face: negative Respiratory: positive for pleurisy/chest pain Cardiovascular: negative Gastrointestinal: negative Genitourinary:negative Integument/breast: negative Hematologic/lymphatic: negative Musculoskeletal:negative Neurological: negative Behavioral/Psych: negative Endocrine: negative Allergic/Immunologic: negative   PHYSICAL EXAMINATION: General appearance: alert, cooperative, fatigued, and no distress Head: Normocephalic, without obvious abnormality, atraumatic Neck: no adenopathy, no JVD, supple, symmetrical, trachea midline, and thyroid  not enlarged, symmetric, no tenderness/mass/nodules Lymph nodes: Cervical, supraclavicular, and axillary nodes normal. Resp: clear to auscultation bilaterally Back: symmetric, no curvature. ROM normal. No CVA tenderness. Cardio: regular rate and rhythm, S1, S2 normal, no murmur, click, rub or gallop GI: soft, non-tender; bowel sounds normal; no masses,  no organomegaly Extremities: extremities normal, atraumatic, no cyanosis or edema Neurologic: Alert and oriented X 3, normal strength and tone. Normal symmetric reflexes. Normal coordination and gait  ECOG PERFORMANCE STATUS: 1 - Symptomatic but completely ambulatory  Blood pressure (!) 141/70, pulse 71, temperature 97.8 F (36.6 C), temperature source Temporal, resp. rate 16, height  5' (1.524 m), weight 125 lb (56.7 kg), SpO2 100%.  LABORATORY DATA: Lab Results  Component Value Date   WBC 4.6 01/25/2024   HGB 10.6 (L) 01/25/2024   HCT 33.9 (L) 01/25/2024   MCV 81.3 01/25/2024   PLT 211 01/25/2024      Chemistry      Component Value Date/Time   NA 140 01/25/2024 0742   K 4.0 01/25/2024 0742   CL 105 01/25/2024 0742   CO2 30 01/25/2024 0742   BUN 9 01/25/2024 0742   CREATININE 0.80 01/25/2024 0742   CREATININE 0.79 03/14/2020 1016      Component Value Date/Time   CALCIUM  9.5 01/25/2024 0742  ALKPHOS 99 01/25/2024 0742   AST 17 01/25/2024 0742   ALT 9 01/25/2024 0742   BILITOT 0.3 01/25/2024 0742       RADIOGRAPHIC STUDIES: CT Chest W Contrast Addendum Date: 01/25/2024 ADDENDUM REPORT: 01/25/2024 17:29 CLINICAL DATA:  Staging non-small-cell lung cancer. * Tracking Code: BO * EXAM: CT CHEST, ABDOMEN, AND PELVIS WITH CONTRAST TECHNIQUE: Multidetector CT imaging of the chest, abdomen and pelvis was performed following the standard protocol during bolus administration of intravenous contrast. RADIATION DOSE REDUCTION: This exam was performed according to the departmental dose-optimization program which includes automated exposure control, adjustment of the mA and/or kV according to patient size and/or use of iterative reconstruction technique. CONTRAST:  OMNIPAQUE  IOHEXOL  300 MG/ML  SOLN COMPARISON:  CT 09/21/2023 and older FINDINGS: CT CHEST FINDINGS Cardiovascular: Heart is nonenlarged. No significant pericardial effusion. Coronary calcifications are seen. There are calcifications along the ascending aorta as well as the great vessels. Some calcifications towards the aortic valve. Right IJ chest port is accessed. Tip of the catheter extends to the right atrium. Mediastinum/Nodes: Heterogeneous thyroid  gland is stable. Normal caliber thoracic esophagus. No specific abnormal lymph node enlargement seen in the axillary regions, hilum or mediastinum.  Lungs/Pleura: Surgical changes again seen from right upper lobectomy. Associated scarring and fibrotic changes identified./of the mediastinum from left-to-right. Elevation right hemidiaphragm. Otherwise no consolidation, pneumothorax or effusion. Some calcified nodule seen at the left lung base are stable. There are areas of opacity along bronchi in the anterior left lower lobe as well which are stable. Few small nodules identified elsewhere along the lungs including 2-3 mm focus in the lingula on series 6, image 75, 3 mm focus left lower lobe series 6, image 109, not significantly changed from previous. Similar left lower lobe focus measuring 3 mm on image 73 of series 6 is stable. No new dominant lung nodule. Musculoskeletal: Moderate degenerative changes along the spine. Multilevel areas of bony sclerosis again seen diffusely along the skeleton. Extent distribution is similar to previous examination. Please correlate clinical history. Underlying osteopenia as well scattered. CT ABDOMEN PELVIS FINDINGS Hepatobiliary: Fatty liver infiltration. No space-occupying liver lesion. Gallbladder is present. Patent portal vein. Pancreas: Unremarkable. No pancreatic ductal dilatation or surrounding inflammatory changes. Spleen: Normal in size without focal abnormality. Adrenals/Urinary Tract: Stable nodularity to left adrenal gland. Right adrenal gland is preserved. Mild bilateral renal atrophy. No enhancing renal mass. No collecting system dilatation. The ureters have a normal course and caliber down to the bladder. Preserved contour to the urinary bladder. Stomach/Bowel: Large bowel has a normal course and caliber with scattered colonic stool. Sigmoid colon diverticula. Normal retrocecal appendix. High attenuation debris along the course of the appendix. The stomach is underdistended. Question some fold thickening. Please correlate with any symptoms. Small bowel is nondilated. There is some distal small bowel stool  appearance identified. Vascular/Lymphatic: Extensive atherosclerotic changes identified along the aorta and branch vessels. No abnormal dilatation of the abdominal aorta. Normal caliber IVC. Areas of significant stenosis suggested along the iliac vessels. No discrete abnormal lymph node enlargement identified in the abdomen and pelvis. Reproductive: Status post hysterectomy. No adnexal masses. Other: No definite free air or free fluid. Trace fat containing hernia along the umbilicus. Musculoskeletal: Once again there are diffuse scattered areas of sclerosis along the skeleton. Surgical changes along the lumbar spine some streak artifact. Trace retrolisthesis of L5 on S1 with associated stenosis. Other areas of stenosis also again suggested. IMPRESSION: No significant interval change. Stable surgical changes from prior right-sided  upper lobectomy and middle lobe wedge resection. No developing new mass lesion, fluid collection or lymph node enlargement. No new aggressive lung nodule. Few tiny left-sided lung nodules are stable. Fatty liver infiltration. Colonic diverticula. Electronically Signed   By: Ranell Bring M.D.   On: 01/25/2024 17:29   Result Date: 01/25/2024 CLINICAL DATA:  Staging non-small-cell lung cancer. EXAM: CT CHEST, ABDOMEN, AND PELVIS WITH CONTRAST TECHNIQUE: Multidetector CT imaging of the chest, abdomen and pelvis was performed following the standard protocol during bolus administration of intravenous contrast. RADIATION DOSE REDUCTION: This exam was performed according to the departmental dose-optimization program which includes automated exposure control, adjustment of the mA and/or kV according to patient size and/or use of iterative reconstruction technique. CONTRAST:  OMNIPAQUE  IOHEXOL  300 MG/ML  SOLN COMPARISON:  CT 09/21/2023 and older FINDINGS: CT CHEST FINDINGS Cardiovascular: Heart is nonenlarged. No significant pericardial effusion. Coronary calcifications are seen. There are  calcifications along the ascending aorta as well as the great vessels. Some calcifications towards the aortic valve. Right IJ chest port is accessed. Tip of the catheter extends to the right atrium. Mediastinum/Nodes: Heterogeneous thyroid  gland is stable. Normal caliber thoracic esophagus. No specific abnormal lymph node enlargement seen in the axillary regions, hilum or mediastinum. Lungs/Pleura: Surgical changes again seen from right upper lobectomy. Associated scarring and fibrotic changes identified./of the mediastinum from left-to-right. Elevation right hemidiaphragm. Otherwise no consolidation, pneumothorax or effusion. Some calcified nodule seen at the left lung base are stable. There are areas of opacity along bronchi in the anterior left lower lobe as well which are stable. Few small nodules identified elsewhere along the lungs including 2-3 mm focus in the lingula on series 6, image 75, 3 mm focus left lower lobe series 6, image 109, not significantly changed from previous. Similar left lower lobe focus measuring 3 mm on image 73 of series 6 is stable. No new dominant lung nodule. Musculoskeletal: Moderate degenerative changes along the spine. Multilevel areas of bony sclerosis again seen diffusely along the skeleton. Extent distribution is similar to previous examination. Please correlate clinical history. Underlying osteopenia as well scattered. CT ABDOMEN PELVIS FINDINGS Hepatobiliary: Fatty liver infiltration. No space-occupying liver lesion. Gallbladder is present. Patent portal vein. Pancreas: Unremarkable. No pancreatic ductal dilatation or surrounding inflammatory changes. Spleen: Normal in size without focal abnormality. Adrenals/Urinary Tract: Stable nodularity to left adrenal gland. Right adrenal gland is preserved. Mild bilateral renal atrophy. No enhancing renal mass. No collecting system dilatation. The ureters have a normal course and caliber down to the bladder. Preserved contour to the  urinary bladder. Stomach/Bowel: Large bowel has a normal course and caliber with scattered colonic stool. Sigmoid colon diverticula. Normal retrocecal appendix. High attenuation debris along the course of the appendix. The stomach is underdistended. Question some fold thickening. Please correlate with any symptoms. Small bowel is nondilated. There is some distal small bowel stool appearance identified. Vascular/Lymphatic: Extensive atherosclerotic changes identified along the aorta and branch vessels. No abnormal dilatation of the abdominal aorta. Normal caliber IVC. Areas of significant stenosis suggested along the iliac vessels. No discrete abnormal lymph node enlargement identified in the abdomen and pelvis. Reproductive: Status post hysterectomy. No adnexal masses. Other: No definite free air or free fluid. Trace fat containing hernia along the umbilicus. Musculoskeletal: Once again there are diffuse scattered areas of sclerosis along the skeleton. Surgical changes along the lumbar spine some streak artifact. Trace retrolisthesis of L5 on S1 with associated stenosis. Other areas of stenosis also again suggested. IMPRESSION: No significant  interval change. Stable surgical changes from prior right-sided upper lobectomy and middle lobe wedge resection. No developing new mass lesion, fluid collection or lymph node enlargement. No new aggressive lung nodule. Few tiny left-sided lung nodules are stable. Fatty liver infiltration.  Colonic diverticula. Electronically Signed: By: Ranell Bring M.D. On: 01/25/2024 17:26   CT ABDOMEN PELVIS W CONTRAST Addendum Date: 01/25/2024 ADDENDUM REPORT: 01/25/2024 17:29 CLINICAL DATA:  Staging non-small-cell lung cancer. * Tracking Code: BO * EXAM: CT CHEST, ABDOMEN, AND PELVIS WITH CONTRAST TECHNIQUE: Multidetector CT imaging of the chest, abdomen and pelvis was performed following the standard protocol during bolus administration of intravenous contrast. RADIATION DOSE  REDUCTION: This exam was performed according to the departmental dose-optimization program which includes automated exposure control, adjustment of the mA and/or kV according to patient size and/or use of iterative reconstruction technique. CONTRAST:  OMNIPAQUE  IOHEXOL  300 MG/ML  SOLN COMPARISON:  CT 09/21/2023 and older FINDINGS: CT CHEST FINDINGS Cardiovascular: Heart is nonenlarged. No significant pericardial effusion. Coronary calcifications are seen. There are calcifications along the ascending aorta as well as the great vessels. Some calcifications towards the aortic valve. Right IJ chest port is accessed. Tip of the catheter extends to the right atrium. Mediastinum/Nodes: Heterogeneous thyroid  gland is stable. Normal caliber thoracic esophagus. No specific abnormal lymph node enlargement seen in the axillary regions, hilum or mediastinum. Lungs/Pleura: Surgical changes again seen from right upper lobectomy. Associated scarring and fibrotic changes identified./of the mediastinum from left-to-right. Elevation right hemidiaphragm. Otherwise no consolidation, pneumothorax or effusion. Some calcified nodule seen at the left lung base are stable. There are areas of opacity along bronchi in the anterior left lower lobe as well which are stable. Few small nodules identified elsewhere along the lungs including 2-3 mm focus in the lingula on series 6, image 75, 3 mm focus left lower lobe series 6, image 109, not significantly changed from previous. Similar left lower lobe focus measuring 3 mm on image 73 of series 6 is stable. No new dominant lung nodule. Musculoskeletal: Moderate degenerative changes along the spine. Multilevel areas of bony sclerosis again seen diffusely along the skeleton. Extent distribution is similar to previous examination. Please correlate clinical history. Underlying osteopenia as well scattered. CT ABDOMEN PELVIS FINDINGS Hepatobiliary: Fatty liver infiltration. No space-occupying liver  lesion. Gallbladder is present. Patent portal vein. Pancreas: Unremarkable. No pancreatic ductal dilatation or surrounding inflammatory changes. Spleen: Normal in size without focal abnormality. Adrenals/Urinary Tract: Stable nodularity to left adrenal gland. Right adrenal gland is preserved. Mild bilateral renal atrophy. No enhancing renal mass. No collecting system dilatation. The ureters have a normal course and caliber down to the bladder. Preserved contour to the urinary bladder. Stomach/Bowel: Large bowel has a normal course and caliber with scattered colonic stool. Sigmoid colon diverticula. Normal retrocecal appendix. High attenuation debris along the course of the appendix. The stomach is underdistended. Question some fold thickening. Please correlate with any symptoms. Small bowel is nondilated. There is some distal small bowel stool appearance identified. Vascular/Lymphatic: Extensive atherosclerotic changes identified along the aorta and branch vessels. No abnormal dilatation of the abdominal aorta. Normal caliber IVC. Areas of significant stenosis suggested along the iliac vessels. No discrete abnormal lymph node enlargement identified in the abdomen and pelvis. Reproductive: Status post hysterectomy. No adnexal masses. Other: No definite free air or free fluid. Trace fat containing hernia along the umbilicus. Musculoskeletal: Once again there are diffuse scattered areas of sclerosis along the skeleton. Surgical changes along the lumbar spine some streak artifact.  Trace retrolisthesis of L5 on S1 with associated stenosis. Other areas of stenosis also again suggested. IMPRESSION: No significant interval change. Stable surgical changes from prior right-sided upper lobectomy and middle lobe wedge resection. No developing new mass lesion, fluid collection or lymph node enlargement. No new aggressive lung nodule. Few tiny left-sided lung nodules are stable. Fatty liver infiltration. Colonic diverticula.  Electronically Signed   By: Ranell Bring M.D.   On: 01/25/2024 17:29   Result Date: 01/25/2024 CLINICAL DATA:  Staging non-small-cell lung cancer. EXAM: CT CHEST, ABDOMEN, AND PELVIS WITH CONTRAST TECHNIQUE: Multidetector CT imaging of the chest, abdomen and pelvis was performed following the standard protocol during bolus administration of intravenous contrast. RADIATION DOSE REDUCTION: This exam was performed according to the departmental dose-optimization program which includes automated exposure control, adjustment of the mA and/or kV according to patient size and/or use of iterative reconstruction technique. CONTRAST:  OMNIPAQUE  IOHEXOL  300 MG/ML  SOLN COMPARISON:  CT 09/21/2023 and older FINDINGS: CT CHEST FINDINGS Cardiovascular: Heart is nonenlarged. No significant pericardial effusion. Coronary calcifications are seen. There are calcifications along the ascending aorta as well as the great vessels. Some calcifications towards the aortic valve. Right IJ chest port is accessed. Tip of the catheter extends to the right atrium. Mediastinum/Nodes: Heterogeneous thyroid  gland is stable. Normal caliber thoracic esophagus. No specific abnormal lymph node enlargement seen in the axillary regions, hilum or mediastinum. Lungs/Pleura: Surgical changes again seen from right upper lobectomy. Associated scarring and fibrotic changes identified./of the mediastinum from left-to-right. Elevation right hemidiaphragm. Otherwise no consolidation, pneumothorax or effusion. Some calcified nodule seen at the left lung base are stable. There are areas of opacity along bronchi in the anterior left lower lobe as well which are stable. Few small nodules identified elsewhere along the lungs including 2-3 mm focus in the lingula on series 6, image 75, 3 mm focus left lower lobe series 6, image 109, not significantly changed from previous. Similar left lower lobe focus measuring 3 mm on image 73 of series 6 is stable. No new  dominant lung nodule. Musculoskeletal: Moderate degenerative changes along the spine. Multilevel areas of bony sclerosis again seen diffusely along the skeleton. Extent distribution is similar to previous examination. Please correlate clinical history. Underlying osteopenia as well scattered. CT ABDOMEN PELVIS FINDINGS Hepatobiliary: Fatty liver infiltration. No space-occupying liver lesion. Gallbladder is present. Patent portal vein. Pancreas: Unremarkable. No pancreatic ductal dilatation or surrounding inflammatory changes. Spleen: Normal in size without focal abnormality. Adrenals/Urinary Tract: Stable nodularity to left adrenal gland. Right adrenal gland is preserved. Mild bilateral renal atrophy. No enhancing renal mass. No collecting system dilatation. The ureters have a normal course and caliber down to the bladder. Preserved contour to the urinary bladder. Stomach/Bowel: Large bowel has a normal course and caliber with scattered colonic stool. Sigmoid colon diverticula. Normal retrocecal appendix. High attenuation debris along the course of the appendix. The stomach is underdistended. Question some fold thickening. Please correlate with any symptoms. Small bowel is nondilated. There is some distal small bowel stool appearance identified. Vascular/Lymphatic: Extensive atherosclerotic changes identified along the aorta and branch vessels. No abnormal dilatation of the abdominal aorta. Normal caliber IVC. Areas of significant stenosis suggested along the iliac vessels. No discrete abnormal lymph node enlargement identified in the abdomen and pelvis. Reproductive: Status post hysterectomy. No adnexal masses. Other: No definite free air or free fluid. Trace fat containing hernia along the umbilicus. Musculoskeletal: Once again there are diffuse scattered areas of sclerosis along the skeleton. Surgical  changes along the lumbar spine some streak artifact. Trace retrolisthesis of L5 on S1 with associated stenosis.  Other areas of stenosis also again suggested. IMPRESSION: No significant interval change. Stable surgical changes from prior right-sided upper lobectomy and middle lobe wedge resection. No developing new mass lesion, fluid collection or lymph node enlargement. No new aggressive lung nodule. Few tiny left-sided lung nodules are stable. Fatty liver infiltration.  Colonic diverticula. Electronically Signed: By: Ranell Bring M.D. On: 01/25/2024 17:26     ASSESSMENT AND PLAN: This is a very pleasant 73 years old African-American female diagnosed with stage IV (T3, N0, M1b) non-small cell lung cancer, adenocarcinoma presented with right upper lobe lung mass with solitary brain metastasis diagnosed in February 2022 status post SRS to the brain lesion followed by craniotomy and resection. The patient also has a solitary lung mass. S/p robotic assisted right upper lobectomy with en bloc wedge resection of the right middle lobe and lymph node dissection under the care of Dr. Kerrin on September 24, 2020. She also underwent SRS treatment to a new subcentimeter brain lesions under the care of Dr. Dewey She underwent systemic chemotherapy with carboplatin  for AUC of 5, Alimta  500 Mg/M2 and Keytruda  200 Mg IV every 3 weeks status post 35 cycles.  Starting from cycle #5 the patient will be on maintenance treatment with Alimta  and Keytruda  every 3 weeks.   The patient tolerated her previous treatment fairly well with no concerning adverse effect except for fatigue.  She completed 2 years of treatment with Keytruda . The patient is currently on observation and she is feeling fine. She had repeat CT scan of the chest, abdomen and pelvis performed recently.  I personally and independently reviewed the scan and discussed the result with the patient and her daughter. Her scan showed no concerning findings for disease progression. Assessment and Plan Assessment & Plan Stage IV non-small cell lung cancer,  adenocarcinoma Stage IV non-small cell lung cancer, adenocarcinoma, diagnosed in February 2022. No actionable mutations and PD-L1 expression of 50%. Previously treated with carboplatin , Alimta , and Keytruda , followed by maintenance with Alimta  and Keytruda , which was discontinued a year ago. Currently on observation. Recent CT scan of the chest, abdomen, and pelvis shows no evidence of disease progression or metastasis. Continues to be asymptomatic with no new complaints. Port remains in place for potential future use due to the risk of cancer progression. - Continue observation. - Order repeat CT scan of the chest, abdomen, and pelvis in four months. - Continue port maintenance with regular flushing.  Fatigue Reports significant fatigue. No new symptoms such as nausea, vomiting, diarrhea, or headaches. Continues to take seizure medication under the care of Dr. Vaslo, with a recent reduction in dosage. Alert most of the time. - Continue current management of seizure medication as per Dr. Eward guidance. She was advised to call immediately if she has any other concerning symptoms in the interval.  The patient voices understanding of current disease status and treatment options and is in agreement with the current care plan.  All questions were answered. The patient knows to call the clinic with any problems, questions or concerns. We can certainly see the patient much sooner if necessary. The total time spent in the appointment was 30 minutes.  Disclaimer: This note was dictated with voice recognition software. Similar sounding words can inadvertently be transcribed and may not be corrected upon review.

## 2024-02-01 NOTE — Telephone Encounter (Signed)
 Scheduled appointments with the patients daughter.

## 2024-02-03 ENCOUNTER — Other Ambulatory Visit: Payer: Self-pay | Admitting: Internal Medicine

## 2024-02-03 ENCOUNTER — Ambulatory Visit: Payer: Self-pay

## 2024-02-03 ENCOUNTER — Ambulatory Visit (INDEPENDENT_AMBULATORY_CARE_PROVIDER_SITE_OTHER): Admitting: Internal Medicine

## 2024-02-03 ENCOUNTER — Encounter: Payer: Self-pay | Admitting: Internal Medicine

## 2024-02-03 ENCOUNTER — Telehealth: Payer: Self-pay | Admitting: Internal Medicine

## 2024-02-03 VITALS — BP 120/68 | HR 62 | Temp 98.0°F | Ht 60.0 in | Wt 122.0 lb

## 2024-02-03 DIAGNOSIS — M5442 Lumbago with sciatica, left side: Secondary | ICD-10-CM

## 2024-02-03 DIAGNOSIS — K5900 Constipation, unspecified: Secondary | ICD-10-CM

## 2024-02-03 DIAGNOSIS — M549 Dorsalgia, unspecified: Secondary | ICD-10-CM | POA: Insufficient documentation

## 2024-02-03 DIAGNOSIS — D496 Neoplasm of unspecified behavior of brain: Secondary | ICD-10-CM

## 2024-02-03 DIAGNOSIS — M5441 Lumbago with sciatica, right side: Secondary | ICD-10-CM

## 2024-02-03 DIAGNOSIS — K219 Gastro-esophageal reflux disease without esophagitis: Secondary | ICD-10-CM | POA: Diagnosis not present

## 2024-02-03 MED ORDER — LINACLOTIDE 72 MCG PO CAPS: 72.0000 ug | ORAL_CAPSULE | Freq: Every day | ORAL | Status: DC

## 2024-02-03 NOTE — Telephone Encounter (Signed)
 Inbound call from patient daughter states patient has been complaining of lower back pain. Also wanting to speak about a recent ct scan.   Patient scheduled for 8/18 with PA. Requesting sooner apt.   Please advise.

## 2024-02-03 NOTE — Telephone Encounter (Signed)
 FYI Only or Action Required?: Action required by provider: request for appointment.  Patient was last seen in primary care on 12/01/2023 by [name], MD.  Called Nurse Triage reporting Tailbone Pain and Back Pain.  Symptoms began several days ago.  Interventions attempted: OTC medications: tylenol .  Symptoms are: unchanged.  Triage Disposition: See HCP Within 4 Hours (Or PCP Triage)  Patient/caregiver understands and will follow disposition?: YesCopied from CRM #[id number]. Topic: Clinical - Red Word Triage >> Feb 03, 2024  9:43 AM Harlene ORN wrote: Red Word that prompted transfer to Nurse Triage: sharp pain in her back and stomach (familiar to when she had colitis) Started this past Sunday. Reason for Disposition  [1] SEVERE back pain (e.g., excruciating, unable to do any normal activities) AND [2] not improved 2 hours after pain medicine  Answer Assessment - Initial Assessment Questions Daughter [name] called with concerns of back and stomach pain. Pt stated it is similar to when pt had colitis. Pt has tried tylenol  for pain. Pain is present while moving. All foods seem to upset stomach and cause back pain.     1. ONSET: When did the pain begin? (e.g., minutes, hours, days)     sunday 2. LOCATION: Where does it hurt? (upper, mid or lower back)     Lower back  3. SEVERITY: How bad is the pain?  (e.g., Scale 1-10; mild, moderate, or severe)     unsure 4. PATTERN: Is the pain constant? (e.g., yes, no; constant, intermittent)      Comes and goes 5. RADIATION: Does the pain shoot into your legs or somewhere else?     Down spine 6. CAUSE:  What do you think is causing the back pain?      Colitis flare-up 7. BACK OVERUSE:  Any recent lifting of heavy objects, strenuous work or exercise?     na 8. MEDICINES: What have you taken so far for the pain? (e.g., nothing, acetaminophen , NSAIDS)     laxative 9. NEUROLOGIC SYMPTOMS: Do you have any weakness, numbness, or  problems with bowel/bladder control?     denies 10. OTHER SYMPTOMS: Do you have any other symptoms? (e.g., fever, abdomen pain, burning with urination, blood in urine)       Stomach pain  Protocols used: Back Pain-A-AH

## 2024-02-03 NOTE — Progress Notes (Signed)
 Subjective:    Patient ID: Hannah [name], female    DOB: 1950/11/28, 73 y.o.   MRN: [id number]      HPI Kerrilyn is here for  Chief Complaint  Patient presents with   Abdominal Pain    Started on Saturday; Pain in left lower back that wraps around side and down into left hip area; Bloating noted also   Back pain-3 days ago her back pain got worse, but has had back pain prior to that.  She states pain that started primarily in the left lower back, but has gone across the lower back and down bilateral lateral legs. The pain in her legs is intermittent.  The pain seems to be worse with movement-especially turning in bed and getting in and out of bed.  She still has pain with rest.  Tylenol  and ibuprofen have helped a little.   Abdominal bloating-every time she eats she gets bloated.  It this has gotten worse.  It started a while ago.  No matter what she eats she will feel abdominal bloating in her upper abdomen.  Her abdomen feels heavy.  She does have chronic constipation.  She takes milk of magnesia daily-sometimes will take MiraLAX  2 capsules fulls daily instead of the MOM.  She drinks prune juice as needed.  She feels like her bowel movements are regular.  She was taking Linzess  145 mcg daily, but that caused diarrhea.  She just had a CT chest, abdomen and pelvis for oncology on 7/28.   Medications and allergies reviewed with patient and updated if appropriate.  Current Outpatient Medications on File Prior to Visit  Medication Sig Dispense Refill   acetaminophen  (TYLENOL ) 325 MG tablet Take 325 mg by mouth every 6 (six) hours as needed for moderate pain.     ALPRAZolam  (XANAX  XR) 0.5 MG 24 hr tablet Take 1 tablet (0.5 mg total) by mouth every morning. 30 tablet 5   ALPRAZolam  (XANAX ) 0.5 MG tablet Take 1 tablet (0.5 mg total) by mouth 3 (three) times daily as needed. for anxiety 90 tablet 5   aspirin  81 MG tablet Take 1 tablet (81 mg total) by mouth daily. Restart on 08/31/20 30  tablet    bisacodyl  (DULCOLAX) 5 MG EC tablet Take 5 mg by mouth daily as needed for moderate constipation.     carboxymethylcellulose (REFRESH PLUS) 0.5 % SOLN Place 1-2 drops into both eyes 3 (three) times daily as needed (dry eyes).     cephALEXin  (KEFLEX ) 500 MG capsule Take 1 capsule (500 mg total) by mouth 2 (two) times daily. 14 capsule 0   Cholecalciferol (D3 PO) Take 1 capsule by mouth daily.     citalopram  (CELEXA ) 10 MG tablet Take 1 tablet by mouth once daily 90 tablet 0   fluticasone  (FLONASE ) 50 MCG/ACT nasal spray Place 2 sprays into both nostrils daily. 48 g 3   folic acid  (FOLVITE ) 1 MG tablet Take 1 tablet by mouth once daily 30 tablet 5   HYDROcodone -acetaminophen  (HYCET) 7.5-325 mg/15 ml solution 15 ml every 6 hours as needed for moderate to severe pain 150 mL 0   hydrocortisone  (ANUSOL -HC) 25 MG suppository Place 1 suppository (25 mg total) rectally 2 (two) times daily. 12 suppository 0   ketoconazole  (NIZORAL ) 2 % cream APPLY 1 APPLICATION TOPICALLY DAILY 30 g 0   Lacosamide  100 MG TABS Take 1 tablet by mouth twice daily 60 tablet 0   levETIRAcetam  (KEPPRA ) 100 MG/ML solution TAKE 5 ML BY MOUTH  TWICE DAILY 473 mL 0   lidocaine -prilocaine  (EMLA ) cream Apply 1 Application topically as needed. 30 g 2   linaclotide  (LINZESS ) 145 MCG CAPS capsule Take 1 capsule (145 mcg total) by mouth daily before breakfast. 30 capsule 6   loratadine  (CLARITIN ) 10 MG tablet Take 10 mg by mouth daily as needed for allergies.     magnesium  hydroxide (MILK OF MAGNESIA) 800 MG/5ML suspension Take 30 mLs by mouth daily as needed for constipation.     Menthol , Topical Analgesic, (BIOFREEZE EX) Apply 1 application topically as needed (pain).     mirtazapine  (REMERON ) 15 MG tablet Take 1 tablet (15 mg total) by mouth at bedtime. 90 tablet 1   olopatadine  (PATANOL) 0.1 % ophthalmic solution Place 1 drop into both eyes 2 (two) times daily as needed for allergies.     omeprazole  (PRILOSEC) 40 MG capsule  Take 1 capsule (40 mg total) by mouth daily. 90 capsule 1   polyethylene glycol (MIRALAX  / GLYCOLAX ) 17 g packet Take 17 g by mouth 2 (two) times daily. 72 each 0   pravastatin  (PRAVACHOL ) 80 MG tablet Take 1 tablet by mouth in the evening 90 tablet 0   Probiotic Product (PROBIOTIC DAILY PO) Take 1 capsule by mouth daily.     prochlorperazine  (COMPAZINE ) 10 MG tablet TAKE 1 TABLET BY MOUTH EVERY 6 HOURS AS NEEDED FOR NAUSEA OR VOMITING 30 tablet 0   simethicone  (MYLICON) 80 MG chewable tablet Chew 1 tablet (80 mg total) by mouth 4 (four) times daily as needed for flatulence (Bloating). 30 tablet 0   No current facility-administered medications on file prior to visit.    Review of Systems  Constitutional:  Negative for fever.  Gastrointestinal:  Positive for abdominal distention (after eating) and constipation (controlled - miralax , MOM). Negative for abdominal pain and nausea.       No gerd  Genitourinary:  Positive for frequency. Negative for dysuria, hematuria and urgency.  Musculoskeletal:  Positive for back pain (lower back pain - pain down lateral legs).  Neurological:  Negative for numbness.       Objective:   Vitals:   02/03/24 1335  BP: 120/68  Pulse: 62  Temp: 98 F (36.7 C)  SpO2: 97%   BP Readings from Last 3 Encounters:  02/03/24 120/68  02/01/24 139/69  01/08/24 132/75   Wt Readings from Last 3 Encounters:  02/03/24 122 lb (55.3 kg)  02/01/24 125 lb (56.7 kg)  12/01/23 128 lb (58.1 kg)   Body mass index is 23.83 kg/m.    Physical Exam Constitutional:      General: She is not in acute distress.    Appearance: Normal appearance. She is well-developed. She is not ill-appearing.  HENT:     Head: Normocephalic and atraumatic.  Abdominal:     General: There is no distension.     Palpations: Abdomen is soft. There is no mass.     Tenderness: There is abdominal tenderness (Left lower quadrant and periumbilical region). There is no guarding or rebound.   Musculoskeletal:        General: Tenderness (Across the lower back) present.  Skin:    General: Skin is warm and dry.  Neurological:     Mental Status: She is alert.     Sensory: No sensory deficit.     Motor: No weakness.            Assessment & Plan:    See Problem List for Assessment and Plan of chronic medical  problems.

## 2024-02-03 NOTE — Assessment & Plan Note (Signed)
 Chronic GERD controlled Continue omeprazole 40 mg daily

## 2024-02-03 NOTE — Assessment & Plan Note (Addendum)
 Chronic I believe her abdominal bloating, discomfort are likely related to constipation She does feel like she is having regular bowel movements but I do not think she is having complete bowel movements Trial of Linzess  72 mcg daily-the 145 mcg was a little too strong for her-samples given Advised her to drink prune juice daily Take MiraLAX  2 capsules daily Advise she will need to adjust this depending on how her stools are Has an appoint with GI later this month

## 2024-02-03 NOTE — Patient Instructions (Addendum)
        Medications changes include :     For the constipation/ abdominal pain -- take miralax  daily - twice daily and drink the prune juice  daily  For the back - possible spinal stenosis -- can try short course of steroids or an anti-inflammatory  Try linzess  72 mcg  for the constipation   Ibuprofen 800 mg twice a day with food.    Return if symptoms worsen or fail to improve.

## 2024-02-03 NOTE — Telephone Encounter (Signed)
 Daughter states pt is having some back pain like she did when she had colitis. Pt scheduled to see Alan Coombs PA 02/12/24@8 :40. Daughter aware of appt.

## 2024-02-03 NOTE — Assessment & Plan Note (Signed)
 Acute on chronic Has chronic lower back pain, her pain recently is worse and radiating down bilateral lateral legs History of back surgery Concern for spinal stenosis She would prefer not to have any interventions come see a specialist or have surgery Will treat conservatively and if no improvement will need further evaluation She would like to avoid steroids Take ibuprofen 600 mg twice daily-take with food-these are small enough for her to swallow If symptoms are not improving will need to see a specialist or consider stronger medication

## 2024-02-09 ENCOUNTER — Other Ambulatory Visit: Payer: Self-pay | Admitting: Internal Medicine

## 2024-02-09 DIAGNOSIS — C3490 Malignant neoplasm of unspecified part of unspecified bronchus or lung: Secondary | ICD-10-CM

## 2024-02-10 ENCOUNTER — Ambulatory Visit: Admitting: Internal Medicine

## 2024-02-11 ENCOUNTER — Encounter: Payer: Self-pay | Admitting: Internal Medicine

## 2024-02-11 NOTE — Progress Notes (Addendum)
 02/12/2024 Hannah [name] [id number] [birth year]  Referring provider: [name], MD Primary GI doctor: Dr. Albertus  ASSESSMENT AND PLAN:   Chronic constipation 01/25/2024 CTAP W for staging stable surgical changes right sided upper lobectomy middle lobe wedge resection, fatty infiltration, colonic diverticula, otherwise unremarkable With rectal prolapse and pressure/dysfunction defecation on MRI 2020 currently patient is having a daily bowel movement with either milk of magnesia 30 to 60 mL or MiraLAX  2 doses Linzess  was helping but then she had diarrhea at the 145 mcg, was on MOM, then now on miralax  BID. Liquid stools States she feels a flutter/discomfort in LLQ and can feel side to side, can have lower back pain/flank.  Pelvic floor dysfunction with rectocele and rectal prolapse, contributing to difficulty in stool evacuation. Previous MRI in December 2020 showed pelvic floor laxity and rectocele. - Check for C. difficile infection via rectal exam. - Restart Linzess  with fiber supplementation. - Add fiber supplement such as Benefiber or Citrucel two to three times daily. - Consider Senokot or Dulcolax every other evening for bowel movement stimulation. - Discontinue Miralax  if Linzess  and fiber are effective. - Order MRI of the pelvis to evaluate pelvic floor. - consider pelvic floor physical therapy at [facility], patient declines - follow up 2 months - discuss repeat colonoscopy at that time.  Back pain/left hip pain Possibly from pelvic floor Some pain on manipulation Suggest follow up with PCP  IDA 01/25/2024  HGB 10.6 MCV 81.3 Platelets 211 03/06/2023 Iron 45 Ferritin 17.3  Recent Labs    03/06/23 1212 03/13/23 1005 06/09/23 1432 06/14/23 1041 07/27/23 0909 09/21/23 1024 01/25/24 0742  HGB 11.9* 11.0* 10.1* 10.8* 11.7* 10.8* 10.6*  In the setting of known lung cancer with mets to the brain, following with hematology oncology Negative FOBT on  exam  Adenomatous polyps 05/20/2019 colonoscopy with hemorrhoids, 4 polyps removed from the transverse, sigmoid and proximal rectum, 2 of the 4 adenomatous the other 2 hyperplastic. An area of discrete congested and edematous mucosa in the distal rectum. Biopsies and showed prolapse related changes. Left-sided diverticulosis and small hemorrhoids. Repeat recommended in 5 years (new recommendation 7 years)   Repeat colonoscopy recommended December 2025  Stage IV non-small cell lung cancer (T3, N0, M1C) adenocarcinoma  With solitary brain metastasis, diagnosed February 2022 Status post right upper lobectomy and middle wedge resection, status post SRS to brain mets and systemic chemotherapy maintenance discontinued 2024  Patient Care Team: [name], MD as PCP - General (Internal Medicine) Lavona Agent, MD as PCP - Cardiology (Cardiology) Fate Morna LOISE, [facility] (Inactive) as Pharmacist (Pharmacist) [facility] as Consulting Physician (Ophthalmology)  HISTORY OF PRESENT ILLNESS: 73 y.o. female with a past medical history of  non-small cell lung cancer with brain mets, PVD, GERD and multiple others listed below presents for evaluation of abdominal pain/back pain.   Patient last seen in the office 03/11/2023 for by Delon Failing, PA for chronic constipation and iron deficiency anemia.  Discussed the use of AI scribe software for clinical note transcription with the patient, who gave verbal consent to proceed.  History of Present Illness   Hannah [name] is a 73 year old female who presents with chronic constipation and bowel movement irregularities. She is accompanied by her daughter, Hannah [name].  She has been experiencing chronic constipation and irregular bowel movements. Initially, Linzess  was prescribed, which helped but later caused diarrhea. She switched to Miralax  twice daily, resulting in liquid stools but  did not alleviate the sensation of incomplete  evacuation. She describes a persistent sensation of stool that 'just won't move or pass.'  She experiences a fluttering spasm in the left lower abdomen and occasional back pain, which she associates with her bowel issues. A bout of colitis in December 2024 was treated with antibiotics, with no recurrence since. Recent imaging has shown scattered stool.  She has two liquid bowel movements per day on Miralax , with occasional nighttime symptoms. No fever, chills, blood in the stool, nausea, or abdominal pain. A urinary tract infection was treated with antibiotics about three weeks ago.  She is anxious about having a bowel movement every day due to a past impaction that required hospitalization. She takes Senna C pills regularly and has tried various laxatives, including Linzess , Miralax , and milk of magnesia, to maintain regularity. She reports that she does not eat much and is not a big eater.      She  reports that she quit smoking about 3 years ago. Her smoking use included cigarettes. She started smoking about 43 years ago. She has a 10 pack-year smoking history. She has never been exposed to tobacco smoke. She has never used smokeless tobacco. She reports that she does not drink alcohol  and does not use drugs.  RELEVANT GI HISTORY, IMAGING AND LABS: Results   RADIOLOGY CT abdomen: Mild to moderate colitis in descending colon, mild colonic diverticulosis (05/2023) CT abdomen: Scattered stool, no colitis, no thickening (01/25/2024) MRI pelvis: Baseline pelvic floor laxity, large rectocele, rectal prolapse upon defecation (06/18/2019)      CBC    Component Value Date/Time   WBC 4.6 01/25/2024 0742   WBC 4.8 07/27/2023 0909   RBC 4.17 01/25/2024 0742   HGB 10.6 (L) 01/25/2024 0742   HCT 33.9 (L) 01/25/2024 0742   PLT 211 01/25/2024 0742   MCV 81.3 01/25/2024 0742   MCV 80.8 09/16/2015 1138   MCH 25.4 (L) 01/25/2024 0742   MCHC 31.3 01/25/2024 0742   RDW 13.5 01/25/2024 0742   LYMPHSABS  1.7 01/25/2024 0742   MONOABS 0.4 01/25/2024 0742   EOSABS 0.1 01/25/2024 0742   BASOSABS 0.0 01/25/2024 0742   Recent Labs    03/06/23 1212 03/13/23 1005 06/09/23 1432 06/14/23 1041 07/27/23 0909 09/21/23 1024 01/25/24 0742  HGB 11.9* 11.0* 10.1* 10.8* 11.7* 10.8* 10.6*    CMP     Component Value Date/Time   NA 140 01/25/2024 0742   K 4.0 01/25/2024 0742   CL 105 01/25/2024 0742   CO2 30 01/25/2024 0742   GLUCOSE 109 (H) 01/25/2024 0742   BUN 9 01/25/2024 0742   CREATININE 0.80 01/25/2024 0742   CREATININE 0.79 03/14/2020 1016   CALCIUM  9.5 01/25/2024 0742   PROT 6.8 01/25/2024 0742   ALBUMIN 3.8 01/25/2024 0742   AST 17 01/25/2024 0742   ALT 9 01/25/2024 0742   ALKPHOS 99 01/25/2024 0742   BILITOT 0.3 01/25/2024 0742   GFRNONAA >60 01/25/2024 0742   GFRNONAA 75 03/14/2020 1016   GFRAA 87 03/14/2020 1016      Latest Ref Rng & Units 01/25/2024    7:42 AM 09/21/2023   10:24 AM 07/27/2023    9:09 AM  Hepatic Function  Total Protein 6.5 - 8.1 g/dL 6.8  6.5  6.8   Albumin 3.5 - 5.0 g/dL 3.8  3.8  4.0   AST 15 - 41 U/L 17  18  21    ALT 0 - 44 U/L 9  9  12  Alk Phosphatase 38 - 126 U/L 99  83  75   Total Bilirubin 0.0 - 1.2 mg/dL 0.3  0.2  0.3       Current Medications:    Current Outpatient Medications (Cardiovascular):    pravastatin  (PRAVACHOL ) 80 MG tablet, Take 1 tablet by mouth in the evening  Current Outpatient Medications (Respiratory):    fluticasone  (FLONASE ) 50 MCG/ACT nasal spray, Place 2 sprays into both nostrils daily.   loratadine  (CLARITIN ) 10 MG tablet, Take 10 mg by mouth daily as needed for allergies.  Current Outpatient Medications (Analgesics):    acetaminophen  (TYLENOL ) 325 MG tablet, Take 325 mg by mouth every 6 (six) hours as needed for moderate pain.   aspirin  81 MG tablet, Take 1 tablet (81 mg total) by mouth daily. Restart on 08/31/20  Current Outpatient Medications (Hematological):    folic acid  (FOLVITE ) 1 MG tablet, Take 1  tablet by mouth once daily  Current Outpatient Medications (Other):    ALPRAZolam  (XANAX  XR) 0.5 MG 24 hr tablet, Take 1 tablet (0.5 mg total) by mouth every morning.   ALPRAZolam  (XANAX ) 0.5 MG tablet, Take 1 tablet (0.5 mg total) by mouth 3 (three) times daily as needed. for anxiety   bisacodyl  (DULCOLAX) 5 MG EC tablet, Take 5 mg by mouth daily as needed for moderate constipation.   carboxymethylcellulose (REFRESH PLUS) 0.5 % SOLN, Place 1-2 drops into both eyes 3 (three) times daily as needed (dry eyes).   Cholecalciferol (D3 PO), Take 1 capsule by mouth daily.   citalopram  (CELEXA ) 10 MG tablet, Take 1 tablet by mouth once daily   hydrocortisone  (ANUSOL -HC) 25 MG suppository, Place 1 suppository (25 mg total) rectally 2 (two) times daily.   ketoconazole  (NIZORAL ) 2 % cream, APPLY 1 APPLICATION TOPICALLY DAILY   Lacosamide  100 MG TABS, Take 1 tablet by mouth twice daily   levETIRAcetam  (KEPPRA ) 100 MG/ML solution, TAKE 5 ML BY MOUTH  TWICE DAILY   lidocaine -prilocaine  (EMLA ) cream, Apply 1 Application topically as needed.   magnesium  hydroxide (MILK OF MAGNESIA) 800 MG/5ML suspension, Take 30 mLs by mouth daily as needed for constipation.   Menthol , Topical Analgesic, (BIOFREEZE EX), Apply 1 application topically as needed (pain).   mirtazapine  (REMERON ) 15 MG tablet, Take 1 tablet (15 mg total) by mouth at bedtime.   olopatadine  (PATANOL) 0.1 % ophthalmic solution, Place 1 drop into both eyes 2 (two) times daily as needed for allergies.   omeprazole  (PRILOSEC) 40 MG capsule, Take 1 capsule (40 mg total) by mouth daily.   polyethylene glycol (MIRALAX  / GLYCOLAX ) 17 g packet, Take 17 g by mouth 2 (two) times daily.   Probiotic Product (PROBIOTIC DAILY PO), Take 1 capsule by mouth daily.   prochlorperazine  (COMPAZINE ) 10 MG tablet, TAKE 1 TABLET BY MOUTH EVERY 6 HOURS AS NEEDED FOR NAUSEA OR VOMITING   simethicone  (MYLICON) 80 MG chewable tablet, Chew 1 tablet (80 mg total) by mouth 4 (four)  times daily as needed for flatulence (Bloating).   linaclotide  (LINZESS ) 145 MCG CAPS capsule, Take 1 capsule (145 mcg total) by mouth daily before breakfast. (Patient not taking: Reported on 02/12/2024)  Medical History:  Past Medical History:  Diagnosis Date   Allergy    Anemia    Anxiety    Arthritis    Carpal tunnel syndrome    Constipation    senna C stool softeners help    Depression    Diverticulosis    Dyslipidemia    External hemorrhoids  GERD (gastroesophageal reflux disease)    Heart murmur    mild-moderate AR   Hiatal hernia    Hyperlipidemia    on meds    Hypertension    Internal hemorrhoids    lung ca with brain mets 07/2020   Osteoarthritis    Pre-diabetes    PVD (peripheral vascular disease) (HCC)    moderate carotid disease   RBBB    Smoker    Vocal cord polyps    Allergies:  Allergies  Allergen Reactions   Amlodipine Swelling and Rash    Rash, swelling   Chantix [Varenicline Tartrate] Shortness Of Breath, Swelling and Other (See Comments)    Tongue swell,sob   Clarithromycin Rash   Lisinopril Hives   Simvastatin Hives   Wellbutrin  [Bupropion  Hcl] Hives   Lipitor [Atorvastatin]     Dizziness per patient   Sertraline      Makes her feel like she is going to kill someone     Surgical History:  She  has a past surgical history that includes Spine surgery (08/16/2009); Tubal ligation; Colonoscopy w/ polypectomy (2009); Hemorrhoid surgery; Total knee arthroplasty (Left, 02/13/2014); Abdominal hysterectomy (1994); Colonoscopy; right total knee arthroplasty; Total knee arthroplasty (Right, 06/04/2018); Joint replacement; Polypectomy; Craniotomy (Left, 08/24/2020); Application of cranial navigation (N/A, 08/24/2020); Intercostal nerve block (Right, 09/24/2020); Lymph node dissection (Right, 09/24/2020); and IR IMAGING GUIDED PORT INSERTION (05/15/2022). Family History:  Her family history includes Breast cancer in her maternal aunt; Cancer in her father and  sister; Colitis in her maternal aunt; Diabetes in her sister; Prostate cancer in her father; Stroke in her maternal grandfather.  REVIEW OF SYSTEMS  : All other systems reviewed and negative except where noted in the History of Present Illness.  PHYSICAL EXAM: BP 130/82   Ht 5' (1.524 m)   Wt 125 lb 12.8 oz (57.1 kg)   BMI 24.57 kg/m  Physical Exam   GENERAL APPEARANCE: Well nourished, in no apparent distress. HEENT: No cervical lymphadenopathy, unremarkable thyroid , sclerae anicteric, conjunctiva pink. RESPIRATORY: Respiratory effort normal, breath sounds equal bilaterally without rales, rhonchi, or wheezing. CARDIO: Regular rate and rhythm with no murmurs, rubs, or gallops, peripheral pulses intact. ABDOMEN: Soft, non-distended, active bowel sounds in all four quadrants, no tenderness to palpation, no rebound, no mass appreciated. RECTAL: Hemorrhoidal skin tags present. Rectum cavernous, no masses, minimal stool. Rectal exam negative for blood. MUSCULOSKELETAL: Full range of motion, normal gait, without edema. SKIN: Dry, intact without rashes or lesions. No jaundice. NEURO: Alert, oriented, no focal deficits. PSYCH: Cooperative, normal mood and affect.      Alan JONELLE Coombs, PA-C 9:21 AM

## 2024-02-12 ENCOUNTER — Other Ambulatory Visit: Payer: Self-pay | Admitting: *Deleted

## 2024-02-12 ENCOUNTER — Encounter: Payer: Self-pay | Admitting: Physician Assistant

## 2024-02-12 ENCOUNTER — Ambulatory Visit: Admitting: Physician Assistant

## 2024-02-12 VITALS — BP 130/82 | Ht 60.0 in | Wt 125.8 lb

## 2024-02-12 DIAGNOSIS — D509 Iron deficiency anemia, unspecified: Secondary | ICD-10-CM | POA: Diagnosis not present

## 2024-02-12 DIAGNOSIS — K5900 Constipation, unspecified: Secondary | ICD-10-CM

## 2024-02-12 DIAGNOSIS — M25559 Pain in unspecified hip: Secondary | ICD-10-CM | POA: Diagnosis not present

## 2024-02-12 DIAGNOSIS — M6289 Other specified disorders of muscle: Secondary | ICD-10-CM

## 2024-02-12 DIAGNOSIS — Z85118 Personal history of other malignant neoplasm of bronchus and lung: Secondary | ICD-10-CM | POA: Diagnosis not present

## 2024-02-12 DIAGNOSIS — Z860101 Personal history of adenomatous and serrated colon polyps: Secondary | ICD-10-CM

## 2024-02-12 DIAGNOSIS — Z87891 Personal history of nicotine dependence: Secondary | ICD-10-CM

## 2024-02-12 DIAGNOSIS — K5909 Other constipation: Secondary | ICD-10-CM | POA: Diagnosis not present

## 2024-02-12 DIAGNOSIS — R197 Diarrhea, unspecified: Secondary | ICD-10-CM

## 2024-02-12 DIAGNOSIS — M549 Dorsalgia, unspecified: Secondary | ICD-10-CM

## 2024-02-12 NOTE — Patient Instructions (Signed)
 Add on fiber twice a day Add back on linzess  Consider senokot every other night  Here some information about pelvic floor dysfunction. This may be contributing to some of your symptoms. We will continue with our evaluation but I do want you to consider adding on fiber supplement with low-dose MiraLAX  daily. We could also refer to pelvic floor physical therapy.   Pelvic Floor Dysfunction, Female Pelvic floor dysfunction (PFD) is a condition that results when the group of muscles and connective tissues that support the organs in the pelvis (pelvic floor muscles) do not work well. These muscles and their connections form a sling that supports the colon and bladder. In women, they also support the uterus. PFD causes pelvic floor muscles to be too weak, too tight, or both. In PFD, muscle movements are not coordinated. This may cause bowel or bladder problems. It may also cause pain. What are the causes? This condition may be caused by an injury to the pelvic area or by a weakening of pelvic muscles. This often results from pregnancy and childbirth or other types of strain. In many cases, the exact cause is not known. What increases the risk? The following factors may make you more likely to develop this condition: Having chronic bladder tissue inflammation (interstitial cystitis). Being an older person. Being overweight. History of radiation treatment for cancer in the pelvic region. Previous pelvic surgery, such as removal of the uterus (hysterectomy). What are the signs or symptoms? Symptoms of this condition vary and may include: Bladder symptoms, such as: Trouble starting urination and emptying the bladder. Frequent urinary tract infections. Leaking urine when coughing, laughing, or exercising (stress incontinence). Having to pass urine urgently or frequently. Pain when passing urine. Bowel symptoms, such as: Constipation. Urgent or frequent bowel movements. Incomplete bowel  movements. Painful bowel movements. Leaking stool or gas. Unexplained genital or rectal pain. Genital or rectal muscle spasms. Low back pain. Other symptoms may include: A heavy, full, or aching feeling in the vagina. A bulge that protrudes into the vagina. Pain during or after sex. How is this diagnosed? This condition may be diagnosed based on: Your symptoms and medical history. A physical exam. During the exam, your health care provider may check your pelvic muscles for tightness, spasm, pain, or weakness. This may include a rectal exam and a pelvic exam. In some cases, you may have diagnostic tests, such as: Electrical muscle function tests. Urine flow testing. X-ray tests of bowel function. Ultrasound of the pelvic organs. How is this treated? Treatment for this condition depends on the symptoms. Treatment options include: Physical therapy. This may include Kegel exercises to help relax or strengthen the pelvic floor muscles. Biofeedback. This type of therapy provides feedback on how tight your pelvic floor muscles are so that you can learn to control them. Internal or external massage therapy. A treatment that involves electrical stimulation of the pelvic floor muscles to help control pain (transcutaneous electrical nerve stimulation, or TENS). Sound wave therapy (ultrasound) to reduce muscle spasms. Medicines, such as: Muscle relaxants. Bladder control medicines. Surgery to reconstruct or support pelvic floor muscles may be an option if other treatments do not help. Follow these instructions at home: Activity Do your usual activities as told by your health care provider. Ask your health care provider if you should modify any activities. Do pelvic floor strengthening or relaxing exercises at home as told by your physical therapist. Lifestyle Maintain a healthy weight. Eat foods that are high in fiber, such as beans, whole  grains, and fresh fruits and vegetables. Limit foods  that are high in fat and processed sugars, such as fried or sweet foods. Manage stress with relaxation techniques such as yoga or meditation. General instructions If you have problems with leakage: Use absorbable pads or wear padded underwear. Wash frequently with mild soap. Keep your genital and anal area as clean and dry as possible. Ask your health care provider if you should try a barrier cream to prevent skin irritation. Take warm baths to relieve pelvic muscle tension or spasms. Take over-the-counter and prescription medicines only as told by your health care provider. Keep all follow-up visits. How is this prevented? The cause of PFD is not always known, but there are a few things you can do to reduce the risk of developing this condition, including: Staying at a healthy weight. Getting regular exercise. Managing stress. Contact a health care provider if: Your symptoms are not improving with home care. You have signs or symptoms of PFD that get worse at home. You develop new signs or symptoms. You have signs of a urinary tract infection, such as: Fever. Chills. Increased urinary frequency. A burning feeling when urinating. You have not had a bowel movement in 3 days (constipation). Summary Pelvic floor dysfunction results when the muscles and connective tissues in your pelvic floor do not work well. These muscles and their connections form a sling that supports your colon and bladder. In women, they also support the uterus. PFD may be caused by an injury to the pelvic area or by a weakening of pelvic muscles. PFD causes pelvic floor muscles to be too weak, too tight, or a combination of both. Symptoms may vary from person to person. In most cases, PFD can be treated with physical therapies and medicines. Surgery may be an option if other treatments do not help. This information is not intended to replace advice given to you by your health care provider. Make sure you discuss  any questions you have with your health care provider. Document Revised: 10/24/2020 Document Reviewed: 10/24/2020 Elsevier Patient Education  2022 [employer].

## 2024-02-15 ENCOUNTER — Ambulatory Visit: Admitting: Physician Assistant

## 2024-02-18 ENCOUNTER — Telehealth: Payer: Self-pay | Admitting: Physician Assistant

## 2024-02-18 NOTE — Telephone Encounter (Signed)
 Cdiff negative diatherix

## 2024-02-19 ENCOUNTER — Ambulatory Visit: Payer: Self-pay | Admitting: Cardiology

## 2024-02-19 ENCOUNTER — Ambulatory Visit (HOSPITAL_COMMUNITY)
Admission: RE | Admit: 2024-02-19 | Discharge: 2024-02-19 | Disposition: A | Discharge status: Home / Self Care | Source: Ambulatory Visit | Attending: Cardiology | Admitting: Cardiology

## 2024-02-19 DIAGNOSIS — I6523 Occlusion and stenosis of bilateral carotid arteries: Secondary | ICD-10-CM | POA: Insufficient documentation

## 2024-02-25 ENCOUNTER — Encounter: Payer: Self-pay | Admitting: Physician Assistant

## 2024-02-27 ENCOUNTER — Ambulatory Visit
Admission: RE | Admit: 2024-02-27 | Discharge: 2024-02-27 | Disposition: A | Discharge status: Home / Self Care | Source: Ambulatory Visit | Attending: Physician Assistant | Admitting: Physician Assistant

## 2024-02-27 DIAGNOSIS — M6289 Other specified disorders of muscle: Secondary | ICD-10-CM

## 2024-02-27 DIAGNOSIS — N8110 Cystocele, unspecified: Secondary | ICD-10-CM | POA: Diagnosis not present

## 2024-02-27 DIAGNOSIS — K5900 Constipation, unspecified: Secondary | ICD-10-CM

## 2024-03-01 ENCOUNTER — Ambulatory Visit: Payer: Self-pay | Admitting: Physician Assistant

## 2024-03-01 DIAGNOSIS — M6289 Other specified disorders of muscle: Secondary | ICD-10-CM

## 2024-03-03 ENCOUNTER — Encounter: Payer: Self-pay | Admitting: Cardiology

## 2024-03-14 ENCOUNTER — Inpatient Hospital Stay: Attending: Internal Medicine

## 2024-03-14 DIAGNOSIS — C3411 Malignant neoplasm of upper lobe, right bronchus or lung: Secondary | ICD-10-CM | POA: Diagnosis not present

## 2024-03-14 DIAGNOSIS — C3491 Malignant neoplasm of unspecified part of right bronchus or lung: Secondary | ICD-10-CM

## 2024-03-14 DIAGNOSIS — Z95828 Presence of other vascular implants and grafts: Secondary | ICD-10-CM

## 2024-03-14 DIAGNOSIS — Z79899 Other long term (current) drug therapy: Secondary | ICD-10-CM | POA: Diagnosis not present

## 2024-03-14 DIAGNOSIS — C3490 Malignant neoplasm of unspecified part of unspecified bronchus or lung: Secondary | ICD-10-CM

## 2024-03-14 MED ORDER — SODIUM CHLORIDE 0.9% FLUSH
10.0000 mL | Freq: Once | INTRAVENOUS | Status: AC
  Administered 2024-03-14: 10 mL

## 2024-03-15 ENCOUNTER — Other Ambulatory Visit: Payer: Self-pay | Admitting: Internal Medicine

## 2024-03-15 DIAGNOSIS — C3490 Malignant neoplasm of unspecified part of unspecified bronchus or lung: Secondary | ICD-10-CM

## 2024-03-16 ENCOUNTER — Encounter: Payer: Self-pay | Admitting: Internal Medicine

## 2024-03-17 ENCOUNTER — Encounter: Payer: Self-pay | Admitting: Internal Medicine

## 2024-03-28 ENCOUNTER — Other Ambulatory Visit: Payer: Self-pay | Admitting: Internal Medicine

## 2024-03-28 DIAGNOSIS — D496 Neoplasm of unspecified behavior of brain: Secondary | ICD-10-CM

## 2024-03-29 ENCOUNTER — Other Ambulatory Visit: Payer: Self-pay

## 2024-03-29 ENCOUNTER — Ambulatory Visit: Attending: Physician Assistant | Admitting: Physical Therapy

## 2024-03-29 ENCOUNTER — Encounter: Payer: Self-pay | Admitting: Physical Therapy

## 2024-03-29 DIAGNOSIS — M6281 Muscle weakness (generalized): Secondary | ICD-10-CM | POA: Insufficient documentation

## 2024-03-29 DIAGNOSIS — M6289 Other specified disorders of muscle: Secondary | ICD-10-CM | POA: Diagnosis not present

## 2024-03-29 DIAGNOSIS — R269 Unspecified abnormalities of gait and mobility: Secondary | ICD-10-CM | POA: Diagnosis not present

## 2024-03-29 NOTE — Therapy (Signed)
 OUTPATIENT PHYSICAL THERAPY FEMALE PELVIC EVALUATION   Patient Name: Hannah [name] MRN: [id number] DOB:July 21, 1950, 73 y.o., female Today's Date: 03/29/2024  END OF SESSION:  PT End of Session - 03/29/24 1025     Visit Number 1    Date for Recertification  06/28/24    Authorization Type Health team MCR no auth req    Progress Note Due on Visit 10    PT Start Time 0845    PT Stop Time 0930    PT Time Calculation (min) 45 min    Activity Tolerance Patient tolerated treatment well;Patient limited by pain    Behavior During Therapy Anxious          Past Medical History:  Diagnosis Date   Allergy    Anemia    Anxiety    Arthritis    Carpal tunnel syndrome    Constipation    senna C stool softeners help    Depression    Diverticulosis    Dyslipidemia    External hemorrhoids    GERD (gastroesophageal reflux disease)    Heart murmur    mild-moderate AR   Hiatal hernia    Hyperlipidemia    on meds    Hypertension    Internal hemorrhoids    lung ca with brain mets 07/2020   Osteoarthritis    Pre-diabetes    PVD (peripheral vascular disease)    moderate carotid disease   RBBB    Smoker    Vocal cord polyps    Past Surgical History:  Procedure Laterality Date   ABDOMINAL HYSTERECTOMY  1994   APPLICATION OF CRANIAL NAVIGATION N/A 08/24/2020   Procedure: APPLICATION OF CRANIAL NAVIGATION;  Surgeon: Cheryle Debby LABOR, MD;  Location: [facility] OR;  Service: Neurosurgery;  Laterality: N/A;   COLONOSCOPY     COLONOSCOPY W/ POLYPECTOMY  2009   CRANIOTOMY Left 08/24/2020   Procedure: Left Craniotomy for Tumor Resection with Brainlab;  Surgeon: Cheryle Debby LABOR, MD;  Location: [facility] OR;  Service: Neurosurgery;  Laterality: Left;   HEMORRHOID SURGERY     INTERCOSTAL NERVE BLOCK Right 09/24/2020   Procedure: INTERCOSTAL NERVE BLOCK;  Surgeon: Kerrin Elspeth BROCKS, MD;  Location: [facility] OR;  Service: Thoracic;  Laterality: Right;   IR IMAGING GUIDED PORT INSERTION  05/15/2022   JOINT  REPLACEMENT     LYMPH NODE DISSECTION Right 09/24/2020   Procedure: LYMPH NODE DISSECTION;  Surgeon: Kerrin Elspeth BROCKS, MD;  Location: [facility] OR;  Service: Thoracic;  Laterality: Right;   POLYPECTOMY     right total knee arthroplasty     Dr. Johann 06-04-18   SPINE SURGERY  08/16/2009   TOTAL KNEE ARTHROPLASTY Left 02/13/2014   Procedure: TOTAL KNEE ARTHROPLASTY;  Surgeon: Norleen LITTIE Gavel, MD;  Location: [facility] OR;  Service: Orthopedics;  Laterality: Left;   TOTAL KNEE ARTHROPLASTY Right 06/04/2018   Procedure: RIGHT TOTAL KNEE ARTHROPLASTY;  Surgeon: Gavel Norleen, MD;  Location: [facility] ORS;  Service: Orthopedics;  Laterality: Right;  Adductor Block   TUBAL LIGATION     Patient Active Problem List   Diagnosis Date Noted   Back pain 02/03/2024   Generalized osteoarthritis of hand 12/01/2023   Paresthesia of left foot 12/01/2023   Rapid heart beat 07/27/2023   Port-A-Cath in place 08/14/2022   Achilles tendonitis 06/27/2022   Rash 06/27/2022   Nasal congestion 03/21/2022   PAD (peripheral artery disease) 03/08/2022   Bilateral shoulder pain 12/11/2021   Fatigue 12/11/2021   Metastatic lung cancer (metastasis from lung to  other site) [facility]) 09/18/2021   Chest pain 08/30/2021   Urinary frequency 08/30/2021   Acute cystitis 07/16/2021   Vasogenic brain edema (HCC) 04/22/2021   Seizure (HCC) 04/22/2021   Nonrheumatic aortic valve insufficiency 04/18/2021   Allergic rhinitis 02/21/2021   Encounter for antineoplastic chemotherapy 10/23/2020   Encounter for antineoplastic immunotherapy 10/23/2020   Anemia 10/19/2020   Sleep difficulties 10/19/2020   S/P robot-assisted surgical procedure 09/24/2020   Aortic atherosclerosis 09/12/2020   Status post craniotomy 08/24/2020   Rectal bleeding 08/17/2020   Primary cancer of right upper lobe of lung (HCC) 08/07/2020   Primary malignant neoplasm of lung with metastasis to brain (HCC) 08/06/2020   Memory changes 07/24/2020   Non-recurrent unilateral  inguinal hernia without obstruction or gangrene 07/24/2020   Pain of right clavicle 09/12/2019   Nonspecific abnormal electrocardiogram (ECG) (EKG) 10/20/2018   Depression 07/17/2018   Primary osteoarthritis of right knee 06/04/2018   Bilateral carotid artery stenosis 09/24/2017   Aortic valve sclerosis 09/24/2017   Vocal cord polyps 09/07/2017   Diabetes (HCC) 03/09/2017   GERD (gastroesophageal reflux disease) 02/27/2016   Anxiety 02/27/2016   S/P total knee replacement 02/13/2014   RBBB 02/08/2014   PVD - bilateral 60-79% carotid strenosis 02/08/2014   Mixed hyperlipidemia 11/29/2013   Carpal tunnel syndrome, bilateral 11/23/2013   Murmur- mild -mod AR, mild MR 11/10/2013   Constipation 12/16/2012    PCP: [name], MD   REFERRING PROVIDER: Craig Alan SAUNDERS, PA-C  REFERRING DIAG: [phone] (ICD-10-CM) - Pelvic floor dysfunction in female   THERAPY DIAG:  Muscle weakness (generalized)  Abnormal gait  Rationale for Evaluation and Treatment: Rehabilitation  ONSET DATE: always constipated but worse since 2024  SUBJECTIVE:                                                                                                                                                                                           SUBJECTIVE STATEMENT: Patient reports that she is constipated, has been taking Miralax , Linzess , Milk of magnesia. Linzess  does not work though [employer] a capful of Miralax  in the morning and a capful in the evening and fiber.  Daughter reports that there is always something that is not emptied. Feels like LLQ and RLQ always feels like there is something there. It hurts.  Pain moves around. Her back hurts as well with walking.   Fluid intake: drinks a lot water  and juice  FUNCTIONAL LIMITATIONS: walks but has constant pain in her thighs and back  PERTINENT HISTORY:  Medications for current condition: laxatives and fiber Surgeries:  cancer treatment in 07/2020,  2023 Other: - Sexual abuse:  No  DIAGNOSTIC FINDINGS:  Post-void residual: Voiding Cystourethrogram (VCUG):  Ultrasound: PAIN:  Are you having pain? Yes NPRS scale: 8/10 Pain location: low back, radiates to her thighs, hard to walk - at least 6-8 months  Pain type: throbbing Pain description: constant   Aggravating factors: walking- back pain, LLQ when she is constipated Relieving factors: back medicine- Ibuprofen  PRECAUTIONS: None  RED FLAGS: None   WEIGHT BEARING RESTRICTIONS: No  FALLS:  Has patient fallen in last 6 months? No  OCCUPATION: retired  ACTIVITY LEVEL : walks in the house- and to appointments, lives with her husband  PLOF: Independent  PATIENT GOALS: patient does not know,    BOWEL MOVEMENT: Pain with bowel movement: Yes Type of bowel movement:Type (Bristol Stool Scale) 1-7, Frequency every day, Strain yes, and Splinting no Fully empty rectum: Yes: sometimes with more laxative Leakage: No                                                      Pads: Yes: fear of leaking Fiber supplement/laxative Yes   URINATION: Pain with urination: No Fully empty bladder: No                                Stream: Strong, it stops sometimes, then she completes it Urgency: No Frequency:during the day yes                                                         Nocturia: Yes: at least 3    Leakage: none Pads/briefs: Yes:    INTERCOURSE: not active   PREGNANCY: Vaginal deliveries 3 Tearing No Episiotomy No C-section deliveries 0 Currently pregnant No  PROLAPSE: None   OBJECTIVE:  Note: Objective measures were completed at Evaluation unless otherwise noted.  DIAGNOSTIC FINDINGS:    PATIENT SURVEYS:    PFIQ-7: 100  COGNITION: Overall cognitive status: Within functional limits for tasks assessed     SENSATION: Light touch: Appears intact  LUMBAR SPECIAL TESTS:    FUNCTIONAL TESTS:   Single leg stance: NT   Sit-up test: very difficult,  uses arms Squat:difficult Bed mobility: difficult  GAIT: Assistive device utilized: None Comments: slow and wide steps  POSTURE: rounded shoulders, forward head, and decreased lumbar lordosis   LUMBARAROM/PROM:   A/PROM A/PROM  Eval (% available)  Flexion 50  Extension 50  Right lateral flexion 50  Left lateral flexion 50  Right rotation 50  Left rotation 50   (Blank rows = not tested)  LOWER EXTREMITY ROM: tightness present throughout  Passive ROM Right eval Left eval  Hip flexion    Hip extension    Hip abduction    Hip adduction    Hip internal rotation Irritating    Hip external rotation    Knee flexion    Knee extension    Ankle dorsiflexion    Ankle plantarflexion    Ankle inversion    Ankle eversion     (Blank rows = not tested)  LOWER EXTREMITY MMT: 4/5 bilateral hips, 4+/5 bilateral knees  PALPATION:  General: upper chest breathing  Pelvic Alignment: even  Abdominal: restrictions throughout abdomen  Diastasis: Yes: 1 finger  Distortion: Yes   Breathing: upper chest Scar tissue: Yes: abdominal and lumbar spinetight and restricted                External Perineal Exam: to be assessed                             Internal Pelvic Floor: to be assessed  Patient confirms identification and approves PT to assess internal pelvic floor and treatment No  PELVIC MMT:   MMT eval  Vaginal   Internal Anal Sphincter   External Anal Sphincter   Puborectalis   Diastasis Recti 1 finger at and above umbilicus with coning  (Blank rows = not tested)        TONE: to be assessed  PROLAPSE: to be assessed  TODAY'S TREATMENT:                                                                                                                              DATE: 03/29/2024  EVAL  Examination completed, findings reviewed, pt educated on POC, HEP, and female pelvic floor anatomy, reasoning with pelvic floor assessment internally. Pt motivated to participate in PT  and agreeable to attempt recommendations.     PATIENT EDUCATION:  Education details: Pt was educated on relevant anatomy, exam findings, home exercise program, plan of care, expectations of PT  Person educated: Patient and Child(ren) Education method: Explanation, Demonstration, Tactile cues, Verbal cues, and Handouts Education comprehension: verbalized understanding, returned demonstration, verbal cues required, tactile cues required, and needs further education  HOME EXERCISE PROGRAM: Bowel routine handout provided HEP will be provided next visit    ASSESSMENT:  CLINICAL IMPRESSION: Patient is a 73 y.o. F who was seen today for physical therapy evaluation and treatment for chronic constipation. Patient also mentioned significant low back pain that radiates down to her thighs 8/10 mostly with walking.  Patient post recent cancer surgeries and treatment.  Patient with chronic constipation, frustrated, decreased spine mobility with tight and tender lumbar scar ( fusion 2011), tight and tender abdominal scar, bilateral hip weakness present, decreased pelvic floor awareness, patient is a poor historian, has a lengthy health history,  daughter present. Recommended education on establishing a bowel routine since patient has never had one, using a squatty potty for now . Patient does not drive. Patient somewhat anxious today about pelvic exam, pelvic floor muscle assessment will be performed in the future. She will benefit from PT to address deficits.   OBJECTIVE IMPAIRMENTS: Abnormal gait, decreased balance, decreased coordination, decreased endurance, decreased knowledge of condition, decreased mobility, difficulty walking, decreased ROM, decreased strength, hypomobility, increased fascial restrictions, increased muscle spasms, impaired sensation, impaired tone, and pain.   ACTIVITY LIMITATIONS: carrying, lifting, transfers, bed mobility, continence, toileting, and locomotion level  PARTICIPATION  LIMITATIONS: community activity and church  PERSONAL FACTORS: Age,  Behavior pattern, Time since onset of injury/illness/exacerbation, and Transportation are also affecting patient's functional outcome.   REHAB POTENTIAL: Good  CLINICAL DECISION MAKING: Evolving/moderate complexity  EVALUATION COMPLEXITY: Moderate   GOALS: Goals reviewed with patient? Yes  SHORT TERM GOALS: Target date: 04/26/2024    Pt will be independent with HEP.   Baseline: Goal status: INITIAL  2.  Pt will be independent with use of squatty potty, relaxed toileting mechanics, and improved bowel movement techniques in order to increase ease of bowel movements and complete evacuation.   Baseline:  Goal status: INITIAL  3.   Pt will demonstrate appropriate lateral rib cage excursion with inhale to ensure better abdominal pressure management and pelvic floor/abdominal muscle relaxation.   Baseline:  Goal status: INITIAL  4.  Patient will be educated on abdominal massage Baseline:  Goal status: INITIAL  5.  Pt will have reduced low back pain to max 1/10 in order to be able to do functional activities such as bending, lifting, twisting and walking as needed to be able to take care of her family and participate in church and community activities Baseline:  Goal status: INITIAL   LONG TERM GOALS: Target date: 06/21/2024    Pt will demonstrate 20 point improvement in PFIQ-7 score in order to show functional improvement in bowel symptoms.   Baseline:  Goal status: INITIAL  2.   Pt will be I and consistent with her HEP and demonstrate all exercises correctly  Baseline:  Goal status: INITIAL  3.  Pt will be able to have a bowel movement within 2-3 minutes so she is not straining and adding pressure on the pelvic floor due to the ability to lengthen the tissue and generate  pressure to push the stool out  Baseline:  Goal status: INITIAL  4.  Patient will report bristol stool scale 3-4  Baseline:  Goal  status: INITIAL  5.  Patient will be I with PT healthy Bowel PT recommendations Baseline:  Goal status: INITIAL  6.  Patient will report regular bowel movements - at least 5 times/ week Baseline:  Goal status: INITIAL  PLAN:  PT FREQUENCY: 1-2x/week  PT DURATION: 12 weeks  PLANNED INTERVENTIONS: 97110-Therapeutic exercises, 97530- Therapeutic activity, 97112- Neuromuscular re-education, 97535- Self Care, 02859- Manual therapy, [phone]- Electrical stimulation (manual), [phone]- Ionotophoresis 4mg /ml Dexamethasone , 79439 (1-2 muscles), 20561 (3+ muscles)- Dry Needling, Patient/Family education, Balance training, Taping, Joint mobilization, Joint manipulation, Spinal manipulation, Spinal mobilization, Manual lymph drainage, Scar mobilization, Cryotherapy, Moist heat, and Biofeedback  PLAN FOR NEXT SESSION: internal pelvic floor muscle assessment, back mobility and core coordination exercises, teach abdominal massage   Devarion Mcclanahan, PT 03/29/2024, 10:26 AM

## 2024-03-30 ENCOUNTER — Encounter: Payer: Self-pay | Admitting: Internal Medicine

## 2024-03-30 MED ORDER — PRAVASTATIN SODIUM 40 MG PO TABS: 80.0000 mg | ORAL_TABLET | Freq: Every day | ORAL | 1 refills | Status: AC

## 2024-04-09 ENCOUNTER — Other Ambulatory Visit: Payer: Self-pay | Admitting: Internal Medicine

## 2024-04-09 DIAGNOSIS — C3490 Malignant neoplasm of unspecified part of unspecified bronchus or lung: Secondary | ICD-10-CM

## 2024-04-11 ENCOUNTER — Encounter: Payer: Self-pay | Admitting: Internal Medicine

## 2024-04-13 ENCOUNTER — Ambulatory Visit: Admitting: Internal Medicine

## 2024-04-13 ENCOUNTER — Encounter: Payer: Self-pay | Admitting: Internal Medicine

## 2024-04-13 VITALS — BP 122/60 | HR 68 | Ht 60.0 in | Wt 125.0 lb

## 2024-04-13 DIAGNOSIS — K5909 Other constipation: Secondary | ICD-10-CM | POA: Diagnosis not present

## 2024-04-13 DIAGNOSIS — Z860101 Personal history of adenomatous and serrated colon polyps: Secondary | ICD-10-CM | POA: Diagnosis not present

## 2024-04-13 DIAGNOSIS — D509 Iron deficiency anemia, unspecified: Secondary | ICD-10-CM | POA: Diagnosis not present

## 2024-04-13 DIAGNOSIS — R1030 Lower abdominal pain, unspecified: Secondary | ICD-10-CM | POA: Diagnosis not present

## 2024-04-13 DIAGNOSIS — M6289 Other specified disorders of muscle: Secondary | ICD-10-CM

## 2024-04-13 DIAGNOSIS — N816 Rectocele: Secondary | ICD-10-CM | POA: Diagnosis not present

## 2024-04-13 MED ORDER — HYOSCYAMINE SULFATE SL 0.125 MG SL SUBL: SUBLINGUAL_TABLET | SUBLINGUAL | 11 refills | Status: AC

## 2024-04-13 NOTE — Patient Instructions (Signed)
 Continue Miralax  twice daily and milk of magnesia as needed.   We have sent the following medications to your pharmacy for you to pick up at your convenience: levsin.  We have referred you to pelvic floor therapy. They will contact with an appointment.  _______________________________________________________  If your blood pressure at your visit was 140/90 or greater, please contact your primary care physician to follow up on this.  _______________________________________________________  If you are age 76 or older, your body mass index should be between 23-30. Your Body mass index is 24.41 kg/m. If this is out of the aforementioned range listed, please consider follow up with your Primary Care Provider.  If you are age 10 or younger, your body mass index should be between 19-25. Your Body mass index is 24.41 kg/m. If this is out of the aformentioned range listed, please consider follow up with your Primary Care Provider.   ________________________________________________________  The [facility] providers would like to encourage you to use MYCHART to communicate with providers for non-urgent requests or questions.  Due to long hold times on the telephone, sending your provider a message by [facility] may be a faster and more efficient way to get a response.  Please allow 48 business hours for a response.  Please remember that this is for non-urgent requests.  _______________________________________________________  [facility] is using a team-based approach to care.  Your team is made up of your doctor and two to three APPS. Our APPS (Nurse Practitioners and Physician Assistants) work with your physician to ensure care continuity for you. They are fully qualified to address your health concerns and develop a treatment plan. They communicate directly with your gastroenterologist to care for you. Seeing the Advanced Practice Practitioners on your physician's team can help you by  facilitating care more promptly, often allowing for earlier appointments, access to diagnostic testing, procedures, and other specialty referrals.

## 2024-04-13 NOTE — Progress Notes (Signed)
 Subjective:    Patient ID: Hannah [name], female    DOB: 1950-12-10, 73 y.o.   MRN: [id number]  HPI Hannah [name] is a 73 year old female with chronic constipation and pelvic floor dysfunction who presents for follow-up.  She is here today with her daughter..  She experiences chronic constipation and pelvic floor dysfunction, requiring medication to manage bowel movements. She takes Miralax  twice daily and milk of magnesia as needed, particularly when bloated. The frequency of milk of magnesia use varies with her diet. Linzess  was previously tried but caused diarrhea without effectively addressing her main complaint of feeling like stool is 'floating around' and causing pain, especially on the left side.  An MRI pelvis from February 27, 2024, revealed pelvic floor laxity, a small cystocele, a small peritoneocele, a medium-sized rectocele, and small interrectal mucosal intussusception during defecation. A CT scan of the abdomen and pelvis with contrast on January 25, 2024, showed no significant interval change, stable post-surgical findings, and colonic diverticulosis without diverticulitis. Her last colonoscopy in November 2020 revealed multiple adenomas, hyperplastic polyps, and mucosal prolapse changes in the rectum.  She experiences intermittent abdominal pain and a sensation of a 'knot' in her lower abdomen. This pain is not constant and is often relieved by laxatives. She describes the sensation as 'fluttering' and notes that it is not present all the time. No current sensation of a 'knot' in her abdomen during the visit.  She still follows closely with oncology for her lung cancer metastatic to brain.   Review of Systems As per HPI, otherwise negative  Current Medications, Allergies, Past Medical History, Past Surgical History, Family History and Social History were reviewed in [employer] record.    Objective:   Physical Exam BP 122/60   Pulse 68   Ht 5' (1.524  m)   Wt 125 lb (56.7 kg)   BMI 24.41 kg/m  Gen: awake, alert, NAD HEENT: anicteric  Abd: soft, NT/ND, +BS throughout Ext: no c/c/e Neuro: nonfocal  RADIOLOGY MRI pelvis: Pelvic floor laxity, small cystocele, small peritoneocele, medium-sized rectocele, small interrectal mucosal intussusception during defecation (02/27/2024) CT abdomen pelvis with contrast: No significant interval change, stable prior right-sided upper lobectomy and middle lobe wedge resection, no new mass lesion, fluid collection or lymph node enlargement, few tiny lung nodules, fatty infiltration of the liver, colonic diverticulosis without diverticulitis (01/25/2024)  DIAGNOSTIC Colonoscopy: Four subcentimeter polyps removed, congested rectal mucosa biopsied, sigmoid and descending colon diverticulosis (05/2019)  PATHOLOGY Rectal biopsy: Mucosal prolapse changes, two adenomatous polyps, two hyperplastic polyps (05/2019)     Latest Ref Rng & Units 01/25/2024    7:42 AM 09/21/2023   10:24 AM 07/27/2023    9:09 AM  CBC  WBC 4.0 - 10.5 K/uL 4.6  4.5  4.8   Hemoglobin 12.0 - 15.0 g/dL 89.3  89.1  88.2   Hematocrit 36.0 - 46.0 % 33.9  34.7  36.3   Platelets 150 - 400 K/uL 211  203  224.0    CMP     Component Value Date/Time   NA 140 01/25/2024 0742   K 4.0 01/25/2024 0742   CL 105 01/25/2024 0742   CO2 30 01/25/2024 0742   GLUCOSE 109 (H) 01/25/2024 0742   BUN 9 01/25/2024 0742   CREATININE 0.80 01/25/2024 0742   CREATININE 0.79 03/14/2020 1016   CALCIUM  9.5 01/25/2024 0742   PROT 6.8 01/25/2024 0742   ALBUMIN 3.8 01/25/2024 0742   AST 17 01/25/2024 0742  ALT 9 01/25/2024 0742   ALKPHOS 99 01/25/2024 0742   BILITOT 0.3 01/25/2024 0742   GFR 78.13 07/27/2023 0909   GFRNONAA >60 01/25/2024 0742   GFRNONAA 75 03/14/2020 1016         Assessment & Plan:  Chronic constipation with pelvic floor dysfunction, rectocele, and rectal mucosal prolapse MRI pelvis showed pelvic floor laxity, small cystocele,  small peritoneocele, medium-sized rectocele, and small interrectal mucosal intussusception. Last colonoscopy in November 2020 showed congested rectal mucosa with changes of mucosal prolapse. Linzess  caused diarrhea without effective stool evacuation. Surgical intervention discussed but deemed higher risk. - Continue Miralax  twice daily. - Use milk of magnesia as needed. - Refer to [facility] Pereyra for pelvic floor physical therapy. - Consider Linzess  as a backup option. - Discuss surgical options with a colorectal and pelvic surgeon if conservative measures fail.  Lower abdominal pain and intestinal spasm Intermittent lower abdominal pain and intestinal spasm likely related to intestinal contractions and constipation. CT scan showed no obstruction. - Prescribe Levsin 0.125 mg, one tablet every four to six hours as needed for crampy lower abdominal pain. - Okay to use over-the-counter simethicone   IDA Heme-negative.  In the setting of lung cancer with brain metastasis. Followed by hematology and oncology  History of adenomatous polyps New recommendation with support repeat colonoscopy at the 7-year mark which will be November 2027 -Discussed at that time based on overall medical condition and from oncology perspective  30 minutes total spent today including patient facing time, coordination of care, reviewing medical history/procedures/pertinent radiology studies, and documentation of the encounter.

## 2024-04-18 ENCOUNTER — Ambulatory Visit (INDEPENDENT_AMBULATORY_CARE_PROVIDER_SITE_OTHER): Payer: Medicare HMO

## 2024-04-18 VITALS — Ht 60.0 in | Wt 125.0 lb

## 2024-04-18 DIAGNOSIS — Z0000 Encounter for general adult medical examination without abnormal findings: Secondary | ICD-10-CM

## 2024-04-18 DIAGNOSIS — Z780 Asymptomatic menopausal state: Secondary | ICD-10-CM | POA: Diagnosis not present

## 2024-04-18 NOTE — Progress Notes (Signed)
 Subjective:   Hannah [name] is a 73 y.o. who presents for a Medicare Wellness preventive visit.  As a reminder, Annual Wellness Visits don't include a physical exam, and some assessments may be limited, especially if this visit is performed virtually. We may recommend an in-person follow-up visit with your provider if needed.  Visit Complete: Virtual I connected with  Hannah [name] on 04/18/24 by a audio enabled telemedicine application and verified that I am speaking with the correct person using two identifiers.  Patient Location: Home  Provider Location: Office/Clinic  I discussed the limitations of evaluation and management by telemedicine. The patient expressed understanding and agreed to proceed.  Vital Signs: Because this visit was a virtual/telehealth visit, some criteria may be missing or patient reported. Any vitals not documented were not able to be obtained and vitals that have been documented are patient reported.  VideoDeclined- This patient declined Librarian, academic. Therefore the visit was completed with audio only.  Persons Participating in Visit: Patient.  AWV Questionnaire: Yes: Patient Medicare AWV questionnaire was completed by the patient on 04/17/2024; I have confirmed that all information answered by patient is correct and no changes since this date.  Cardiac Risk Factors include: advanced age (>6men, >18 women);dyslipidemia     Objective:    Today's Vitals   04/18/24 0855  Weight: 125 lb (56.7 kg)  Height: 5' (1.524 m)   Body mass index is 24.41 kg/m.     04/18/2024    8:56 AM 03/29/2024   10:21 AM 06/14/2023   10:28 AM 04/16/2023    8:47 AM 12/18/2022    8:22 AM 11/27/2022   11:37 AM 11/06/2022   11:49 AM  Advanced Directives  Does Patient Have a Medical Advance Directive? Yes No No Yes Yes Yes Yes  Type of Estate agent of [place];Living will   Healthcare Power of [place];Living will  Healthcare Power of [place];Living will Healthcare Power of [place];Living will Healthcare Power of [place];Living will  Does patient want to make changes to medical advance directive?    No - Patient declined     Copy of Healthcare Power of Attorney in Chart? No - copy requested   Yes - validated most recent copy scanned in chart (See row information)     Would patient like information on creating a medical advance directive? No - Patient declined No - Patient declined No - Patient declined  No - Patient declined  No - Patient declined    Current Medications (verified) Outpatient Encounter Medications as of 04/18/2024  Medication Sig   acetaminophen  (TYLENOL ) 325 MG tablet Take 325 mg by mouth every 6 (six) hours as needed for moderate pain.   ALPRAZolam  (XANAX  XR) 0.5 MG 24 hr tablet TAKE 1 TABLET BY MOUTH IN THE MORNING   ALPRAZolam  (XANAX ) 0.5 MG tablet Take 1 tablet (0.5 mg total) by mouth 3 (three) times daily as needed. for anxiety   aspirin  81 MG tablet Take 1 tablet (81 mg total) by mouth daily. Restart on 08/31/20   bisacodyl  (DULCOLAX) 5 MG EC tablet Take 5 mg by mouth daily as needed for moderate constipation.   carboxymethylcellulose (REFRESH PLUS) 0.5 % SOLN Place 1-2 drops into both eyes 3 (three) times daily as needed (dry eyes).   Cholecalciferol (D3 PO) Take 1 capsule by mouth daily.   citalopram  (CELEXA ) 10 MG tablet Take 1 tablet by mouth once daily   fluticasone  (FLONASE ) 50 MCG/ACT nasal spray Place 2 sprays  into both nostrils daily.   folic acid  (FOLVITE ) 1 MG tablet Take 1 tablet by mouth once daily   hydrocortisone  (ANUSOL -HC) 25 MG suppository Place 1 suppository (25 mg total) rectally 2 (two) times daily.   Hyoscyamine Sulfate SL (LEVSIN/SL) 0.125 MG SUBL Take 1 tablet by mouth every 4-6 hours as needed for abdominal cramping abdominal pain   ketoconazole  (NIZORAL ) 2 % cream APPLY 1 APPLICATION TOPICALLY DAILY   Lacosamide  100 MG TABS Take 1 tablet by mouth twice  daily   levETIRAcetam  (KEPPRA ) 100 MG/ML solution TAKE 5 ML BY MOUTH  TWICE DAILY   lidocaine -prilocaine  (EMLA ) cream Apply 1 Application topically as needed.   loratadine  (CLARITIN ) 10 MG tablet Take 10 mg by mouth daily as needed for allergies.   magnesium  hydroxide (MILK OF MAGNESIA) 800 MG/5ML suspension Take 30 mLs by mouth daily as needed for constipation.   Menthol , Topical Analgesic, (BIOFREEZE EX) Apply 1 application topically as needed (pain).   mirtazapine  (REMERON ) 15 MG tablet TAKE 1 TABLET BY MOUTH AT BEDTIME   olopatadine  (PATANOL) 0.1 % ophthalmic solution Place 1 drop into both eyes 2 (two) times daily as needed for allergies.   omeprazole  (PRILOSEC) 40 MG capsule Take 1 capsule (40 mg total) by mouth daily.   polyethylene glycol (MIRALAX  / GLYCOLAX ) 17 g packet Take 17 g by mouth 2 (two) times daily.   pravastatin  (PRAVACHOL ) 40 MG tablet Take 2 tablets (80 mg total) by mouth daily.   Probiotic Product (PROBIOTIC DAILY PO) Take 1 capsule by mouth daily.   prochlorperazine  (COMPAZINE ) 10 MG tablet TAKE 1 TABLET BY MOUTH EVERY 6 HOURS AS NEEDED FOR NAUSEA OR VOMITING   simethicone  (MYLICON) 80 MG chewable tablet Chew 1 tablet (80 mg total) by mouth 4 (four) times daily as needed for flatulence (Bloating).   linaclotide  (LINZESS ) 145 MCG CAPS capsule Take 1 capsule (145 mcg total) by mouth daily before breakfast. (Patient not taking: Reported on 04/18/2024)   No facility-administered encounter medications on file as of 04/18/2024.    Allergies (verified) Amlodipine, Chantix [varenicline tartrate], Clarithromycin, Lisinopril, Simvastatin, Wellbutrin  [bupropion  hcl], Lipitor [atorvastatin], and Sertraline    History: Past Medical History:  Diagnosis Date   Allergy    Anemia    Anxiety    Arthritis    Carpal tunnel syndrome    Constipation    senna C stool softeners help    Depression    Diverticulosis    Dyslipidemia    External hemorrhoids    GERD (gastroesophageal  reflux disease)    Heart murmur    mild-moderate AR   Hiatal hernia    Hyperlipidemia    on meds    Hypertension    Internal hemorrhoids    lung ca with brain mets 07/2020   Osteoarthritis    Pre-diabetes    PVD (peripheral vascular disease)    moderate carotid disease   RBBB    Smoker    Vocal cord polyps    Past Surgical History:  Procedure Laterality Date   ABDOMINAL HYSTERECTOMY  1994   APPLICATION OF CRANIAL NAVIGATION N/A 08/24/2020   Procedure: APPLICATION OF CRANIAL NAVIGATION;  Surgeon: Cheryle Debby LABOR, MD;  Location: [facility] OR;  Service: Neurosurgery;  Laterality: N/A;   COLONOSCOPY     COLONOSCOPY W/ POLYPECTOMY  2009   CRANIOTOMY Left 08/24/2020   Procedure: Left Craniotomy for Tumor Resection with Brainlab;  Surgeon: Cheryle Debby LABOR, MD;  Location: [facility] OR;  Service: Neurosurgery;  Laterality: Left;   HEMORRHOID SURGERY  INTERCOSTAL NERVE BLOCK Right 09/24/2020   Procedure: INTERCOSTAL NERVE BLOCK;  Surgeon: Kerrin Elspeth BROCKS, MD;  Location: [facility] OR;  Service: Thoracic;  Laterality: Right;   IR IMAGING GUIDED PORT INSERTION  05/15/2022   JOINT REPLACEMENT     LYMPH NODE DISSECTION Right 09/24/2020   Procedure: LYMPH NODE DISSECTION;  Surgeon: Kerrin Elspeth BROCKS, MD;  Location: [facility] OR;  Service: Thoracic;  Laterality: Right;   POLYPECTOMY     right total knee arthroplasty     Dr. Johann 06-04-18   SPINE SURGERY  08/16/2009   TOTAL KNEE ARTHROPLASTY Left 02/13/2014   Procedure: TOTAL KNEE ARTHROPLASTY;  Surgeon: Norleen LITTIE Gavel, MD;  Location: [facility] OR;  Service: Orthopedics;  Laterality: Left;   TOTAL KNEE ARTHROPLASTY Right 06/04/2018   Procedure: RIGHT TOTAL KNEE ARTHROPLASTY;  Surgeon: Gavel Norleen, MD;  Location: [facility] ORS;  Service: Orthopedics;  Laterality: Right;  Adductor Block   TUBAL LIGATION     Family History  Problem Relation Age of Onset   Dementia Mother    Prostate cancer Father    Diabetes Sister    Cancer Sister    Stroke Maternal Grandfather     Colitis Maternal Aunt    Breast cancer Maternal Aunt    Colon cancer Neg Hx    Colon polyps Neg Hx    Rectal cancer Neg Hx    Stomach cancer Neg Hx    Esophageal cancer Neg Hx    Social History   Socioeconomic History   Marital status: Married    Spouse name: Not on file   Number of children: 3   Years of education: Not on file   Highest education level: 12th grade  Occupational History   Not on file  Tobacco Use   Smoking status: Former    Current packs/day: 0.00    Average packs/day: 0.3 packs/day for 40.0 years (10.0 ttl pk-yrs)    Types: Cigarettes    Start date: 07/09/1980    Quit date: 07/09/2020    Years since quitting: 3.7    Passive exposure: Never   Smokeless tobacco: Never   Tobacco comments:    PACK WILL LAST 3 days  Vaping Use   Vaping status: Never Used  Substance and Sexual Activity   Alcohol  use: No   Drug use: No   Sexual activity: Not Currently  Other Topics Concern   Not on file  Social History Narrative   Married   Social Drivers of Health   Financial Resource Strain: Low Risk  (04/18/2024)   Overall Financial Resource Strain (CARDIA)    Difficulty of Paying Living Expenses: Not hard at all  Food Insecurity: No Food Insecurity (04/18/2024)   Hunger Vital Sign    Worried About Running Out of Food in the Last Year: Never true    Ran Out of Food in the Last Year: Never true  Transportation Needs: No Transportation Needs (04/18/2024)   PRAPARE - Administrator, Civil Service (Medical): No    Lack of Transportation (Non-Medical): No  Physical Activity: Insufficiently Active (04/18/2024)   Exercise Vital Sign    Days of Exercise per Week: 3 days    Minutes of Exercise per Session: 30 min  Stress: No Stress Concern Present (04/18/2024)   [employer] of Occupational Health - Occupational Stress Questionnaire    Feeling of Stress: Not at all  Social Connections: Socially Integrated (04/18/2024)   Social Connection and  Isolation Panel    Frequency of Communication with Friends  and Family: More than three times a week    Frequency of Social Gatherings with Friends and Family: More than three times a week    Attends Religious Services: More than 4 times per year    Active Member of [employer] or Organizations: Yes    Attends Engineer, structural: More than 4 times per year    Marital Status: Married    Tobacco Counseling Counseling given: Not Answered Tobacco comments: PACK WILL LAST 3 days    Clinical Intake:  Pre-visit preparation completed: Yes  Pain : No/denies pain     BMI - recorded: 24.41 Nutritional Status: BMI of 19-24  Normal Nutritional Risks: None Diabetes: Yes CBG done?: No Did pt. bring in CBG monitor from home?: No  Lab Results  Component Value Date   HGBA1C 6.1 (A) 09/21/2023   HGBA1C 6.1 09/21/2023   HGBA1C 6.1 09/21/2023   HGBA1C 6.1 09/21/2023     How often do you need to have someone help you when you read instructions, pamphlets, or other written materials from your doctor or pharmacy?: 5 - Always (Dtr helps)  Interpreter Needed?: No  Information entered by :: Hannah [name], Hannah [name]   Activities of Daily Living     04/18/2024    8:58 AM  In your present state of health, do you have any difficulty performing the following activities:  Hearing? 0  Vision? 0  Difficulty concentrating or making decisions? 0  Walking or climbing stairs? 0  Dressing or bathing? 0  Doing errands, shopping? 0  Preparing Food and eating ? N  Using the Toilet? N  In the past six months, have you accidently leaked urine? Y  Comment wears depends  Do you have problems with loss of bowel control? N  Managing your Medications? Y  Comment Dtr handles  Managing your Finances? Y  Comment Dtr Hotel manager your Housekeeping? N    Patient Care Team: [name], MD as PCP - General (Internal Medicine) Lavona Agent, MD as PCP - Cardiology  (Cardiology) Fate Morna SAILOR, [facility] (Inactive) as Pharmacist (Pharmacist) [facility] as Consulting Physician (Ophthalmology)  I have updated your Care Teams any recent Medical Services you may have received from other providers in the past year.     Assessment:   This is a routine wellness examination for Hannah [name].  Hearing/Vision screen Hearing Screening - Comments:: Denies hearing difficulties   Vision Screening - Comments:: Wears eyeglasses for reading - up to date with routine eye exams with [facility]   Goals Addressed               This Visit's Progress     Patient Stated (pt-stated)        Patient stated she plans to continue to watch her diet and exercising       Depression Screen     04/18/2024    9:00 AM 02/03/2024    1:53 PM 04/16/2023    8:49 AM 12/23/2022    2:16 PM 09/19/2022    9:34 AM 09/11/2022   11:19 AM 09/01/2022    3:11 PM  PHQ 2/9 Scores  PHQ - 2 Score 0 2 0 0 0 0 2  PHQ- 9 Score 0 4 0 0 1 3 3     Fall Risk     04/18/2024    8:59 AM 02/03/2024    1:53 PM 07/27/2023    8:09 AM 04/16/2023    8:53 AM 04/16/2023  7:30 AM  Fall Risk   Falls in the past year? 0 0 1 0 0  Number falls in past yr: 0 0 0 0 0  Injury with Fall? 0 0 0 0 0  Risk for fall due to : No Fall Risks No Fall Risks No Fall Risks No Fall Risks   Follow up Falls evaluation completed;Falls prevention discussed Falls evaluation completed Falls evaluation completed Falls prevention discussed     MEDICARE RISK AT HOME:  Medicare Risk at Home Any stairs in or around the home?: No If so, are there any without handrails?: No Home free of loose throw rugs in walkways, pet beds, electrical cords, etc?: Yes Adequate lighting in your home to reduce risk of falls?: Yes Life alert?: No Use of a cane, walker or w/c?: No Grab bars in the bathroom?: No Shower chair or bench in shower?: No Elevated toilet seat or a handicapped toilet?: No  TIMED UP AND GO:  Was the  test performed?  No  Cognitive Function: 6CIT completed    04/16/2023    8:52 AM 03/06/2023    2:32 PM 12/21/2017    8:43 AM  MMSE - Mini Mental State Exam  Not completed: Unable to complete    Orientation to time  2 5  Orientation to Place  4 5  Registration  2 3  Attention/ Calculation  0 5  Recall  3 3  Language- name 2 objects  2 2  Language- repeat  1 1  Language- follow 3 step command  3 3  Language- read & follow direction  1 1  Write a sentence  1 1  Copy design  0 1  Total score  19 30        04/18/2024    9:03 AM 04/16/2023    8:52 AM 04/14/2022    9:08 AM  6CIT Screen  What Year? 0 points 0 points 0 points  What month? 0 points 0 points 0 points  What time? 0 points 0 points 0 points  Count back from 20 0 points 0 points 0 points  Months in reverse 0 points 0 points 4 points  Repeat phrase 2 points 0 points 2 points  Total Score 2 points 0 points 6 points    Immunizations Immunization History  Administered Date(s) Administered   Fluad Quad(high Dose 65+) 03/14/2019, 03/14/2020, 05/01/2022   Moderna SARS-COV2 Booster Vaccination 05/01/2020   Moderna Sars-Covid-2 Vaccination 08/22/2019, 09/20/2019, 06/26/2022   Pneumococcal Conjugate-13 05/30/2014   Pneumococcal Polysaccharide-23 02/27/2016   Td 08/01/2004   Tdap 05/30/2014    Screening Tests Health Maintenance  Topic Date Due   Diabetic kidney evaluation - Urine ACR  03/14/2021   DEXA SCAN  09/02/2021   FOOT EXAM  03/20/2022   Influenza Vaccine  01/29/2024   COVID-19 Vaccine (4 - 2025-26 season) 02/29/2024   HEMOGLOBIN A1C  03/23/2024   Colonoscopy  05/19/2024   DTaP/Tdap/Td (3 - Td or Tdap) 05/30/2024   OPHTHALMOLOGY EXAM  01/19/2025   Diabetic kidney evaluation - eGFR measurement  01/24/2025   Medicare Annual Wellness (AWV)  04/18/2025   Pneumococcal Vaccine: 50+ Years  Completed   Hepatitis C Screening  Completed   Meningococcal B Vaccine  Aged Out   Mammogram  Discontinued   Zoster  Vaccines- Shingrix  Discontinued    Health Maintenance Items Addressed: DEXA ordered today.    Colonoscopy status: Pt stated but unclear - Dr Albertus stated may not need a repeat procedure.  Additional  Screening:  Vision Screening: Recommended annual ophthalmology exams for early detection of glaucoma and other disorders of the eye. Is the patient up to date with their annual eye exam?  Yes  Who is the provider or what is the name of the office in which the patient attends annual eye exams? [facility]  Dental Screening: Recommended annual dental exams for proper oral hygiene  Community Resource Referral / Chronic Care Management: CRR required this visit?  No   CCM required this visit?  No   Plan:    I have personally reviewed and noted the following in the patient's chart:   Medical and social history Use of alcohol , tobacco or illicit drugs  Current medications and supplements including opioid prescriptions. Patient is not currently taking opioid prescriptions. Functional ability and status Nutritional status Physical activity Advanced directives List of other physicians Hospitalizations, surgeries, and ER visits in previous 12 months Vitals Screenings to include cognitive, depression, and falls Referrals and appointments  In addition, I have reviewed and discussed with patient certain preventive protocols, quality metrics, and best practice recommendations. A written personalized care plan for preventive services as well as general preventive health recommendations were provided to patient.   Hannah CHRISTELLA [name], Hannah [name]   04/18/2024   After Visit Summary: (MyChart) Due to this being a telephonic visit, the after visit summary with patients personalized plan was offered to patient via MyChart   Notes: Nothing significant to report at this time.

## 2024-04-18 NOTE — Patient Instructions (Addendum)
 Hannah [name],  Thank you for taking the time for your Medicare Wellness Visit. I appreciate your continued commitment to your health goals. Please review the care plan we discussed, and feel free to reach out if I can assist you further.  Medicare recommends these wellness visits once per year to help you and your care team stay ahead of potential health issues. These visits are designed to focus on prevention, allowing your provider to concentrate on managing your acute and chronic conditions during your regular appointments.  Please note that Annual Wellness Visits do not include a physical exam. Some assessments may be limited, especially if the visit was conducted virtually. If needed, we may recommend a separate in-person follow-up with your provider.  Ongoing Care Seeing your primary care provider every 3 to 6 months helps us  monitor your health and provide consistent, personalized care.   Referrals If a referral was made during today's visit and you haven't received any updates within two weeks, please contact the referred provider directly to check on the status.  Recommended Screenings:  Health Maintenance  Topic Date Due   Yearly kidney health urinalysis for diabetes  03/14/2021   DEXA scan (bone density measurement)  09/02/2021   Complete foot exam   03/20/2022   Flu Shot  01/29/2024   COVID-19 Vaccine (4 - 2025-26 season) 02/29/2024   Hemoglobin A1C  03/23/2024   Colon Cancer Screening  05/19/2024   DTaP/Tdap/Td vaccine (3 - Td or Tdap) 05/30/2024   Eye exam for diabetics  01/19/2025   Yearly kidney function blood test for diabetes  01/24/2025   Medicare Annual Wellness Visit  04/18/2025   Pneumococcal Vaccine for age over 53  Completed   Hepatitis C Screening  Completed   Meningitis B Vaccine  Aged Out   Breast Cancer Screening  Discontinued   Zoster (Shingles) Vaccine  Discontinued       04/18/2024    8:56 AM  Advanced Directives  Does Patient Have a Medical  Advance Directive? Yes  Type of Estate agent of [place];Living will  Copy of Healthcare Power of Attorney in Chart? No - copy requested  Would patient like information on creating a medical advance directive? No - Patient declined   Advance Care Planning is important because it: Ensures you receive medical care that aligns with your values, goals, and preferences. Provides guidance to your family and loved ones, reducing the emotional burden of decision-making during critical moments.  Vision: Annual vision screenings are recommended for early detection of glaucoma, cataracts, and diabetic retinopathy. These exams can also reveal signs of chronic conditions such as diabetes and high blood pressure.  Dental: Annual dental screenings help detect early signs of oral cancer, gum disease, and other conditions linked to overall health, including heart disease and diabetes.  Please see the attached documents for additional preventive care recommendations.

## 2024-04-19 ENCOUNTER — Encounter: Payer: Self-pay | Admitting: Internal Medicine

## 2024-04-19 NOTE — Patient Instructions (Addendum)
 Flu immunization administered today.      Blood work was ordered.       Medications changes include :   None     Return in about 6 months (around 10/19/2024) for follow up, Schedule DEXA-[place].     Health Maintenance, Female Adopting a healthy lifestyle and getting preventive care are important in promoting health and wellness. Ask your health care provider about: The right schedule for you to have regular tests and exams. Things you can do on your own to prevent diseases and keep yourself healthy. What should I know about diet, weight, and exercise? Eat a healthy diet  Eat a diet that includes plenty of vegetables, fruits, low-fat dairy products, and lean protein. Do not eat a lot of foods that are high in solid fats, added sugars, or sodium. Maintain a healthy weight Body mass index (BMI) is used to identify weight problems. It estimates body fat based on height and weight. Your health care provider can help determine your BMI and help you achieve or maintain a healthy weight. Get regular exercise Get regular exercise. This is one of the most important things you can do for your health. Most adults should: Exercise for at least 150 minutes each week. The exercise should increase your heart rate and make you sweat (moderate-intensity exercise). Do strengthening exercises at least twice a week. This is in addition to the moderate-intensity exercise. Spend less time sitting. Even light physical activity can be beneficial. Watch cholesterol and blood lipids Have your blood tested for lipids and cholesterol at 74 years of age, then have this test every 5 years. Have your cholesterol levels checked more often if: Your lipid or cholesterol levels are high. You are older than 75 years of age. You are at high risk for heart disease. What should I know about cancer screening? Depending on your health history and family history, you may need to have cancer screening at various  ages. This may include screening for: Breast cancer. Cervical cancer. Colorectal cancer. Skin cancer. Lung cancer. What should I know about heart disease, diabetes, and high blood pressure? Blood pressure and heart disease High blood pressure causes heart disease and increases the risk of stroke. This is more likely to develop in people who have high blood pressure readings or are overweight. Have your blood pressure checked: Every 3-5 years if you are 8-44 years of age. Every year if you are 59 years old or older. Diabetes Have regular diabetes screenings. This checks your fasting blood sugar level. Have the screening done: Once every three years after age 25 if you are at a normal weight and have a low risk for diabetes. More often and at a younger age if you are overweight or have a high risk for diabetes. What should I know about preventing infection? Hepatitis B If you have a higher risk for hepatitis B, you should be screened for this virus. Talk with your health care provider to find out if you are at risk for hepatitis B infection. Hepatitis C Testing is recommended for: Everyone born from [birth year] through 1967. Anyone with known risk factors for hepatitis C. Sexually transmitted infections (STIs) Get screened for STIs, including gonorrhea and chlamydia, if: You are sexually active and are younger than 75 years of age. You are older than 75 years of age and your health care provider tells you that you are at risk for this type of infection. Your sexual activity has changed since you were last  screened, and you are at increased risk for chlamydia or gonorrhea. Ask your health care provider if you are at risk. Ask your health care provider about whether you are at high risk for HIV. Your health care provider may recommend a prescription medicine to help prevent HIV infection. If you choose to take medicine to prevent HIV, you should first get tested for HIV. You should then be tested  every 3 months for as long as you are taking the medicine. Pregnancy If you are about to stop having your period (premenopausal) and you may become pregnant, seek counseling before you get pregnant. Take 400 to 800 micrograms (mcg) of folic acid  every day if you become pregnant. Ask for birth control (contraception) if you want to prevent pregnancy. Osteoporosis and menopause Osteoporosis is a disease in which the bones lose minerals and strength with aging. This can result in bone fractures. If you are 43 years old or older, or if you are at risk for osteoporosis and fractures, ask your health care provider if you should: Be screened for bone loss. Take a calcium  or vitamin D  supplement to lower your risk of fractures. Be given hormone replacement therapy (HRT) to treat symptoms of menopause. Follow these instructions at home: Alcohol  use Do not drink alcohol  if: Your health care provider tells you not to drink. You are pregnant, may be pregnant, or are planning to become pregnant. If you drink alcohol : Limit how much you have to: 0-1 drink a day. Know how much alcohol  is in your drink. In the [place] one drink equals one 12 oz bottle of beer (355 mL), one 5 oz glass of wine (148 mL), or one 1 oz glass of hard liquor (44 mL). Lifestyle Do not use any products that contain nicotine  or tobacco. These products include cigarettes, chewing tobacco, and vaping devices, such as e-cigarettes. If you need help quitting, ask your health care provider. Do not use street drugs. Do not share needles. Ask your health care provider for help if you need support or information about quitting drugs. General instructions Schedule regular health, dental, and eye exams. Stay current with your vaccines. Tell your health care provider if: You often feel depressed. You have ever been abused or do not feel safe at home. Summary Adopting a healthy lifestyle and getting preventive care are important in promoting  health and wellness. Follow your health care provider's instructions about healthy diet, exercising, and getting tested or screened for diseases. Follow your health care provider's instructions on monitoring your cholesterol and blood pressure. This information is not intended to replace advice given to you by your health care provider. Make sure you discuss any questions you have with your health care provider. Document Revised: 11/05/2020 Document Reviewed: 11/05/2020 Elsevier Patient Education  2024 [employer].

## 2024-04-19 NOTE — Progress Notes (Unsigned)
 Subjective:    Patient ID: Hannah [name], female    DOB: 11/19/1950, 73 y.o.   MRN: [id number]      HPI Hannah [name] is here for a Physical exam and her chronic medical problems.      Medications and allergies reviewed with patient and updated if appropriate.  Current Outpatient Medications on File Prior to Visit  Medication Sig Dispense Refill   acetaminophen  (TYLENOL ) 325 MG tablet Take 325 mg by mouth every 6 (six) hours as needed for moderate pain.     ALPRAZolam  (XANAX  XR) 0.5 MG 24 hr tablet TAKE 1 TABLET BY MOUTH IN THE MORNING 30 tablet 0   ALPRAZolam  (XANAX ) 0.5 MG tablet Take 1 tablet (0.5 mg total) by mouth 3 (three) times daily as needed. for anxiety 90 tablet 5   aspirin  81 MG tablet Take 1 tablet (81 mg total) by mouth daily. Restart on 08/31/20 30 tablet    bisacodyl  (DULCOLAX) 5 MG EC tablet Take 5 mg by mouth daily as needed for moderate constipation.     carboxymethylcellulose (REFRESH PLUS) 0.5 % SOLN Place 1-2 drops into both eyes 3 (three) times daily as needed (dry eyes).     Cholecalciferol (D3 PO) Take 1 capsule by mouth daily.     citalopram  (CELEXA ) 10 MG tablet Take 1 tablet by mouth once daily 90 tablet 0   fluticasone  (FLONASE ) 50 MCG/ACT nasal spray Place 2 sprays into both nostrils daily. 48 g 3   folic acid  (FOLVITE ) 1 MG tablet Take 1 tablet by mouth once daily 30 tablet 5   hydrocortisone  (ANUSOL -HC) 25 MG suppository Place 1 suppository (25 mg total) rectally 2 (two) times daily. 12 suppository 0   Hyoscyamine Sulfate SL (LEVSIN/SL) 0.125 MG SUBL Take 1 tablet by mouth every 4-6 hours as needed for abdominal cramping abdominal pain 120 tablet 11   ketoconazole  (NIZORAL ) 2 % cream APPLY 1 APPLICATION TOPICALLY DAILY 30 g 0   Lacosamide  100 MG TABS Take 1 tablet by mouth twice daily 60 tablet 0   levETIRAcetam  (KEPPRA ) 100 MG/ML solution TAKE 5 ML BY MOUTH  TWICE DAILY 473 mL 0   lidocaine -prilocaine  (EMLA ) cream Apply 1 Application topically as needed.  30 g 2   linaclotide  (LINZESS ) 145 MCG CAPS capsule Take 1 capsule (145 mcg total) by mouth daily before breakfast. (Patient not taking: Reported on 04/18/2024) 30 capsule 6   loratadine  (CLARITIN ) 10 MG tablet Take 10 mg by mouth daily as needed for allergies.     magnesium  hydroxide (MILK OF MAGNESIA) 800 MG/5ML suspension Take 30 mLs by mouth daily as needed for constipation.     Menthol , Topical Analgesic, (BIOFREEZE EX) Apply 1 application topically as needed (pain).     mirtazapine  (REMERON ) 15 MG tablet TAKE 1 TABLET BY MOUTH AT BEDTIME 90 tablet 1   olopatadine  (PATANOL) 0.1 % ophthalmic solution Place 1 drop into both eyes 2 (two) times daily as needed for allergies.     omeprazole  (PRILOSEC) 40 MG capsule Take 1 capsule (40 mg total) by mouth daily. 90 capsule 1   polyethylene glycol (MIRALAX  / GLYCOLAX ) 17 g packet Take 17 g by mouth 2 (two) times daily. 72 each 0   pravastatin  (PRAVACHOL ) 40 MG tablet Take 2 tablets (80 mg total) by mouth daily. 180 tablet 1   Probiotic Product (PROBIOTIC DAILY PO) Take 1 capsule by mouth daily.     prochlorperazine  (COMPAZINE ) 10 MG tablet TAKE 1 TABLET BY MOUTH EVERY 6 HOURS  AS NEEDED FOR NAUSEA OR VOMITING 30 tablet 0   simethicone  (MYLICON) 80 MG chewable tablet Chew 1 tablet (80 mg total) by mouth 4 (four) times daily as needed for flatulence (Bloating). 30 tablet 0   No current facility-administered medications on file prior to visit.    Review of Systems     Objective:  There were no vitals filed for this visit. There were no vitals filed for this visit. There is no height or weight on file to calculate BMI.  BP Readings from Last 3 Encounters:  04/13/24 122/60  02/12/24 130/82  02/03/24 120/68    Wt Readings from Last 3 Encounters:  04/18/24 125 lb (56.7 kg)  04/13/24 125 lb (56.7 kg)  02/12/24 125 lb 12.8 oz (57.1 kg)       Physical Exam Constitutional: She appears well-developed and well-nourished. No distress.  HENT:   Head: Normocephalic and atraumatic.  Right Ear: External ear normal. Normal ear canal and TM Left Ear: External ear normal.  Normal ear canal and TM Mouth/Throat: Oropharynx is clear and moist.  Eyes: Conjunctivae normal.  Neck: Neck supple. No tracheal deviation present. No thyromegaly present.  No carotid bruit  Cardiovascular: Normal rate, regular rhythm and normal heart sounds.   No murmur heard.  No edema. Pulmonary/Chest: Effort normal and breath sounds normal. No respiratory distress. She has no wheezes. She has no rales.  Breast: deferred   Abdominal: Soft. She exhibits no distension. There is no tenderness.  Lymphadenopathy: She has no cervical adenopathy.  Skin: Skin is warm and dry. She is not diaphoretic.  Psychiatric: She has a normal mood and affect. Her behavior is normal.     Lab Results  Component Value Date   WBC 4.6 01/25/2024   HGB 10.6 (L) 01/25/2024   HCT 33.9 (L) 01/25/2024   PLT 211 01/25/2024   GLUCOSE 109 (H) 01/25/2024   CHOL 196 03/24/2023   TRIG 183.0 (H) 03/24/2023   HDL 84.00 03/24/2023   LDLDIRECT 89.0 03/21/2022   LDLCALC 75 03/24/2023   ALT 9 01/25/2024   AST 17 01/25/2024   NA 140 01/25/2024   K 4.0 01/25/2024   CL 105 01/25/2024   CREATININE 0.80 01/25/2024   BUN 9 01/25/2024   CO2 30 01/25/2024   TSH 0.79 07/27/2023   INR 1.0 02/12/2021   HGBA1C 6.1 (A) 09/21/2023   HGBA1C 6.1 09/21/2023   HGBA1C 6.1 09/21/2023   HGBA1C 6.1 09/21/2023   MICROALBUR 0.6 03/14/2020         Assessment & Plan:   Physical exam: Screening blood work  ordered Exercise   Weight   Substance abuse  none   Reviewed recommended immunizations.   Health Maintenance  Topic Date Due   Diabetic kidney evaluation - Urine ACR  03/14/2021   DEXA SCAN  09/02/2021   FOOT EXAM  03/20/2022   Influenza Vaccine  01/29/2024   COVID-19 Vaccine (4 - 2025-26 season) 02/29/2024   HEMOGLOBIN A1C  03/23/2024   Colonoscopy  05/19/2024   DTaP/Tdap/Td (3 - Td  or Tdap) 05/30/2024   OPHTHALMOLOGY EXAM  01/19/2025   Diabetic kidney evaluation - eGFR measurement  01/24/2025   Medicare Annual Wellness (AWV)  04/18/2025   Pneumococcal Vaccine: 50+ Years  Completed   Hepatitis C Screening  Completed   Meningococcal B Vaccine  Aged Out   Mammogram  Discontinued   Zoster Vaccines- Shingrix  Discontinued          See Problem List for Assessment and  Plan of chronic medical problems.

## 2024-04-20 ENCOUNTER — Ambulatory Visit: Payer: Self-pay | Admitting: Internal Medicine

## 2024-04-20 ENCOUNTER — Ambulatory Visit (INDEPENDENT_AMBULATORY_CARE_PROVIDER_SITE_OTHER): Admitting: Internal Medicine

## 2024-04-20 VITALS — BP 130/78 | HR 55 | Temp 98.2°F | Ht 60.0 in | Wt 126.0 lb

## 2024-04-20 DIAGNOSIS — Z0001 Encounter for general adult medical examination with abnormal findings: Secondary | ICD-10-CM | POA: Diagnosis not present

## 2024-04-20 DIAGNOSIS — C7931 Secondary malignant neoplasm of brain: Secondary | ICD-10-CM

## 2024-04-20 DIAGNOSIS — F3289 Other specified depressive episodes: Secondary | ICD-10-CM

## 2024-04-20 DIAGNOSIS — R011 Cardiac murmur, unspecified: Secondary | ICD-10-CM

## 2024-04-20 DIAGNOSIS — G479 Sleep disorder, unspecified: Secondary | ICD-10-CM | POA: Diagnosis not present

## 2024-04-20 DIAGNOSIS — C3490 Malignant neoplasm of unspecified part of unspecified bronchus or lung: Secondary | ICD-10-CM

## 2024-04-20 DIAGNOSIS — H6123 Impacted cerumen, bilateral: Secondary | ICD-10-CM

## 2024-04-20 DIAGNOSIS — E1169 Type 2 diabetes mellitus with other specified complication: Secondary | ICD-10-CM

## 2024-04-20 DIAGNOSIS — E785 Hyperlipidemia, unspecified: Secondary | ICD-10-CM

## 2024-04-20 DIAGNOSIS — R569 Unspecified convulsions: Secondary | ICD-10-CM

## 2024-04-20 DIAGNOSIS — E2839 Other primary ovarian failure: Secondary | ICD-10-CM

## 2024-04-20 DIAGNOSIS — Z23 Encounter for immunization: Secondary | ICD-10-CM

## 2024-04-20 DIAGNOSIS — F419 Anxiety disorder, unspecified: Secondary | ICD-10-CM

## 2024-04-20 DIAGNOSIS — Z0000 Encounter for general adult medical examination without abnormal findings: Secondary | ICD-10-CM

## 2024-04-20 DIAGNOSIS — K219 Gastro-esophageal reflux disease without esophagitis: Secondary | ICD-10-CM

## 2024-04-20 DIAGNOSIS — Z780 Asymptomatic menopausal state: Secondary | ICD-10-CM

## 2024-04-20 LAB — COMPREHENSIVE METABOLIC PANEL WITH GFR
ALT: 10 U/L (ref 0–35)
AST: 18 U/L (ref 0–37)
Albumin: 4.2 g/dL (ref 3.5–5.2)
Alkaline Phosphatase: 95 U/L (ref 39–117)
BUN: 12 mg/dL (ref 6–23)
CO2: 31 meq/L (ref 19–32)
Calcium: 9.8 mg/dL (ref 8.4–10.5)
Chloride: 103 meq/L (ref 96–112)
Creatinine, Ser: 0.82 mg/dL (ref 0.40–1.20)
GFR: 69.83 mL/min (ref >60.00)
Glucose, Bld: 103 mg/dL — ABNORMAL HIGH (ref 70–99)
Potassium: 3.9 meq/L (ref 3.5–5.1)
Sodium: 142 meq/L (ref 135–145)
Total Bilirubin: 0.2 mg/dL (ref 0.2–1.2)
Total Protein: 6.9 g/dL (ref 6.0–8.3)

## 2024-04-20 LAB — CBC
HCT: 33.9 % — ABNORMAL LOW (ref 36.0–46.0)
Hemoglobin: 10.8 g/dL — ABNORMAL LOW (ref 12.0–15.0)
MCHC: 31.8 g/dL (ref 30.0–36.0)
MCV: 78.6 fl (ref 78.0–100.0)
Platelets: 229 K/uL (ref 150.0–400.0)
RBC: 4.31 Mil/uL (ref 3.87–5.11)
RDW: 15.1 % (ref 11.5–15.5)
WBC: 4.4 K/uL (ref 4.0–10.5)

## 2024-04-20 LAB — MICROALBUMIN / CREATININE URINE RATIO
Creatinine,U: 36.9 mg/dL
Microalb Creat Ratio: UNDETERMINED mg/g (ref 0.0–30.0)
Microalb, Ur: <0.7 mg/dL

## 2024-04-20 LAB — LIPID PANEL
Cholesterol: 169 mg/dL (ref 0–200)
HDL: 41.5 mg/dL (ref >39.00)
LDL Cholesterol: 80 mg/dL (ref 0–99)
NonHDL: 127.54
Total CHOL/HDL Ratio: 4
Triglycerides: 237 mg/dL — ABNORMAL HIGH (ref 0.0–149.0)
VLDL: 47.4 mg/dL — ABNORMAL HIGH (ref 0.0–40.0)

## 2024-04-20 LAB — TSH: TSH: 1.16 u[IU]/mL (ref 0.35–5.50)

## 2024-04-20 LAB — HEMOGLOBIN A1C: Hgb A1c MFr Bld: 6.3 % (ref 4.6–6.5)

## 2024-04-20 MED ORDER — KETOCONAZOLE 2 % EX CREA: 1.0000 | TOPICAL_CREAM | Freq: Every day | CUTANEOUS | 0 refills | Status: AC

## 2024-04-20 MED ORDER — ALPRAZOLAM 0.5 MG PO TABS: 0.5000 mg | ORAL_TABLET | Freq: Three times a day (TID) | ORAL | 5 refills | Status: AC | PRN

## 2024-04-20 NOTE — Assessment & Plan Note (Addendum)
 Chronic Controlled Continue mirtazapine  15 mg nightly, citalopram  10 mg daily Continue alprazolam  XR 0.5 mg every evening and quick acting alprazolam  0.5 mg 3 times daily as needed Encouraged regular exercise

## 2024-04-20 NOTE — Assessment & Plan Note (Signed)
 Chronic GERD controlled Continue omeprazole 40 mg daily

## 2024-04-20 NOTE — Assessment & Plan Note (Signed)
 Chronic Regular exercise and healthy diet encouraged Lab Results  Component Value Date   LDLCALC 75 03/24/2023   Check lipid panel, CMP, TSH Continue pravastatin  80 mg daily.

## 2024-04-20 NOTE — Assessment & Plan Note (Signed)
 Chronic Heart murmur sounds stable Following with cardiology and they are monitoring AR, MR

## 2024-04-20 NOTE — Assessment & Plan Note (Addendum)
Chronic Sleeping well most nights Continue taking Remeron 15 mg nightly

## 2024-04-20 NOTE — Assessment & Plan Note (Signed)
 Acute Has a feeling of something wet in her ears They itch at times Has excessive cerumen bilaterally and some dryness which is likely the cause of her symptoms Discussed symptomatic treatment for the itchiness Can refer her to ENT for earwax removal-she deferred for now will let me know if she would like to be referred

## 2024-04-20 NOTE — Assessment & Plan Note (Signed)
 Chronic Following with oncology and neuro-oncology Observation-no evidence of disease progression

## 2024-04-20 NOTE — Addendum Note (Signed)
 Addended by: CLAUDENE TOBIAS PARAS on: 04/20/2024 08:44 AM   Modules accepted: Orders

## 2024-04-20 NOTE — Assessment & Plan Note (Addendum)
 Chronic Fairly controlled Continue mirtazapine  15 mg nightly, citalopram  10 mg daily

## 2024-04-20 NOTE — Assessment & Plan Note (Addendum)
 Yeah chronic Associated with hyperlipidemia  Lab Results  Component Value Date   HGBA1C 6.1 (A) 09/21/2023   HGBA1C 6.1 09/21/2023   HGBA1C 6.1 09/21/2023   HGBA1C 6.1 09/21/2023   Sugars well controlled Check A1c, urine albumin/creatinine ratio Continue lifestyle control Stressed diabetic diet

## 2024-04-20 NOTE — Assessment & Plan Note (Signed)
 Chronic Management per Dr. Buckley On Keppra  500 mg twice daily, lacosamide  100 mg twice daily

## 2024-04-20 NOTE — Assessment & Plan Note (Addendum)
 Chronic Following with oncology and neuro-oncology Currently in observation without evidence of disease progression

## 2024-04-22 ENCOUNTER — Inpatient Hospital Stay: Attending: Internal Medicine

## 2024-04-22 DIAGNOSIS — Z79899 Other long term (current) drug therapy: Secondary | ICD-10-CM | POA: Insufficient documentation

## 2024-04-22 DIAGNOSIS — C7931 Secondary malignant neoplasm of brain: Secondary | ICD-10-CM | POA: Diagnosis not present

## 2024-04-22 DIAGNOSIS — C3411 Malignant neoplasm of upper lobe, right bronchus or lung: Secondary | ICD-10-CM | POA: Insufficient documentation

## 2024-05-02 ENCOUNTER — Other Ambulatory Visit: Payer: Self-pay | Admitting: Internal Medicine

## 2024-05-04 ENCOUNTER — Telehealth: Payer: Self-pay | Admitting: Internal Medicine

## 2024-05-04 NOTE — Telephone Encounter (Signed)
 Patient dropped off document Handicap Placard, to be filled out by provider. Patient requested to send it back via Call Patient to pick up within 5-days. Document is located in providers tray at front office.Please advise at Mobile [phone] (mobile)

## 2024-05-06 ENCOUNTER — Inpatient Hospital Stay: Admission: RE | Admit: 2024-05-06 | Source: Ambulatory Visit

## 2024-05-09 NOTE — Telephone Encounter (Signed)
 Form ready for pick up and daughter notified.  Rita to pick up for this week.

## 2024-05-10 ENCOUNTER — Other Ambulatory Visit: Payer: Self-pay

## 2024-05-10 ENCOUNTER — Other Ambulatory Visit: Payer: Self-pay | Admitting: Internal Medicine

## 2024-05-10 DIAGNOSIS — C3490 Malignant neoplasm of unspecified part of unspecified bronchus or lung: Secondary | ICD-10-CM

## 2024-05-10 DIAGNOSIS — C3491 Malignant neoplasm of unspecified part of right bronchus or lung: Secondary | ICD-10-CM

## 2024-05-10 DIAGNOSIS — D496 Neoplasm of unspecified behavior of brain: Secondary | ICD-10-CM

## 2024-05-10 MED ORDER — LIDOCAINE-PRILOCAINE 2.5-2.5 % EX CREA: 1.0000 | TOPICAL_CREAM | CUTANEOUS | 2 refills | Status: AC | PRN

## 2024-05-16 ENCOUNTER — Telehealth: Payer: Self-pay

## 2024-05-16 NOTE — Telephone Encounter (Signed)
 Oral Oncology Patient Advocate Encounter   Received notification that prior authorization for emla  cream is required.   PA submitted on 11/17/225 Key [id number] Status is pending     Lucie Lamer, CPhT [place]  [facility] Oncology Pharmacy Patient Advocate Specialist [facility] [place] Phone: [phone]  Fax: [phone] [email or web address]

## 2024-05-16 NOTE — Telephone Encounter (Signed)
 Oral Oncology Patient Advocate Encounter  Prior Authorization for emla  cream has been approved.    PA# [id number] Effective dates: 05/16/24 through 08/15/24   Lucie Lamer, CPhT [place]  [facility] Oncology Pharmacy Patient Advocate Specialist [facility] [place] Phone: [phone]  Fax: [phone] [email or web address]

## 2024-05-23 ENCOUNTER — Encounter (HOSPITAL_COMMUNITY): Payer: Self-pay

## 2024-05-23 ENCOUNTER — Ambulatory Visit (HOSPITAL_COMMUNITY)
Admission: RE | Admit: 2024-05-23 | Discharge: 2024-05-23 | Disposition: A | Discharge status: Home / Self Care | Source: Ambulatory Visit | Attending: Internal Medicine | Admitting: Internal Medicine

## 2024-05-23 DIAGNOSIS — I700 Atherosclerosis of aorta: Secondary | ICD-10-CM | POA: Diagnosis not present

## 2024-05-23 DIAGNOSIS — I719 Aortic aneurysm of unspecified site, without rupture: Secondary | ICD-10-CM | POA: Diagnosis not present

## 2024-05-23 DIAGNOSIS — C3490 Malignant neoplasm of unspecified part of unspecified bronchus or lung: Secondary | ICD-10-CM | POA: Diagnosis not present

## 2024-05-23 DIAGNOSIS — J439 Emphysema, unspecified: Secondary | ICD-10-CM | POA: Diagnosis not present

## 2024-05-23 MED ORDER — IOHEXOL 300 MG/ML  SOLN
100.0000 mL | Freq: Once | INTRAMUSCULAR | Status: AC | PRN
  Administered 2024-05-23: 100 mL via INTRAVENOUS

## 2024-05-23 MED ORDER — HEPARIN SOD (PORK) LOCK FLUSH 100 UNIT/ML IV SOLN
INTRAVENOUS | Status: AC
  Filled 2024-05-23: qty 5

## 2024-05-23 MED ORDER — SODIUM CHLORIDE (PF) 0.9 % IJ SOLN
INTRAMUSCULAR | Status: AC
  Filled 2024-05-23: qty 50

## 2024-05-23 MED ORDER — HEPARIN SOD (PORK) LOCK FLUSH 100 UNIT/ML IV SOLN
500.0000 [IU] | Freq: Once | INTRAVENOUS | Status: AC
  Administered 2024-05-23: 500 [IU] via INTRAVENOUS

## 2024-05-24 ENCOUNTER — Encounter: Admitting: Physical Therapy

## 2024-06-01 ENCOUNTER — Telehealth: Payer: Self-pay | Admitting: Internal Medicine

## 2024-06-01 NOTE — Telephone Encounter (Signed)
 Called and left a voicemail for the patient needing to schedule an appointment to go over MRI results.

## 2024-06-02 ENCOUNTER — Other Ambulatory Visit

## 2024-06-02 ENCOUNTER — Inpatient Hospital Stay: Attending: Internal Medicine

## 2024-06-02 DIAGNOSIS — M6281 Muscle weakness (generalized): Secondary | ICD-10-CM | POA: Insufficient documentation

## 2024-06-02 DIAGNOSIS — Z833 Family history of diabetes mellitus: Secondary | ICD-10-CM | POA: Insufficient documentation

## 2024-06-02 DIAGNOSIS — E785 Hyperlipidemia, unspecified: Secondary | ICD-10-CM | POA: Insufficient documentation

## 2024-06-02 DIAGNOSIS — Z8719 Personal history of other diseases of the digestive system: Secondary | ICD-10-CM | POA: Diagnosis not present

## 2024-06-02 DIAGNOSIS — Z96653 Presence of artificial knee joint, bilateral: Secondary | ICD-10-CM | POA: Insufficient documentation

## 2024-06-02 DIAGNOSIS — Z90710 Acquired absence of both cervix and uterus: Secondary | ICD-10-CM | POA: Insufficient documentation

## 2024-06-02 DIAGNOSIS — C3411 Malignant neoplasm of upper lobe, right bronchus or lung: Secondary | ICD-10-CM | POA: Insufficient documentation

## 2024-06-02 DIAGNOSIS — R269 Unspecified abnormalities of gait and mobility: Secondary | ICD-10-CM | POA: Diagnosis not present

## 2024-06-02 DIAGNOSIS — Z8042 Family history of malignant neoplasm of prostate: Secondary | ICD-10-CM | POA: Diagnosis not present

## 2024-06-02 DIAGNOSIS — Z809 Family history of malignant neoplasm, unspecified: Secondary | ICD-10-CM | POA: Insufficient documentation

## 2024-06-02 DIAGNOSIS — Z87891 Personal history of nicotine dependence: Secondary | ICD-10-CM | POA: Insufficient documentation

## 2024-06-02 DIAGNOSIS — R569 Unspecified convulsions: Secondary | ICD-10-CM | POA: Insufficient documentation

## 2024-06-02 DIAGNOSIS — C7931 Secondary malignant neoplasm of brain: Secondary | ICD-10-CM | POA: Diagnosis not present

## 2024-06-02 DIAGNOSIS — G939 Disorder of brain, unspecified: Secondary | ICD-10-CM | POA: Diagnosis not present

## 2024-06-02 DIAGNOSIS — Z923 Personal history of irradiation: Secondary | ICD-10-CM | POA: Diagnosis not present

## 2024-06-02 DIAGNOSIS — Z818 Family history of other mental and behavioral disorders: Secondary | ICD-10-CM | POA: Diagnosis not present

## 2024-06-02 DIAGNOSIS — Z881 Allergy status to other antibiotic agents status: Secondary | ICD-10-CM | POA: Diagnosis not present

## 2024-06-02 DIAGNOSIS — Z823 Family history of stroke: Secondary | ICD-10-CM | POA: Insufficient documentation

## 2024-06-02 DIAGNOSIS — Z79899 Other long term (current) drug therapy: Secondary | ICD-10-CM | POA: Diagnosis not present

## 2024-06-02 DIAGNOSIS — Z803 Family history of malignant neoplasm of breast: Secondary | ICD-10-CM | POA: Insufficient documentation

## 2024-06-02 DIAGNOSIS — Z888 Allergy status to other drugs, medicaments and biological substances status: Secondary | ICD-10-CM | POA: Diagnosis not present

## 2024-06-02 DIAGNOSIS — C3490 Malignant neoplasm of unspecified part of unspecified bronchus or lung: Secondary | ICD-10-CM

## 2024-06-02 LAB — CMP (CANCER CENTER ONLY)
ALT: 12 U/L (ref 0–44)
AST: 25 U/L (ref 15–41)
Albumin: 4.1 g/dL (ref 3.5–5.0)
Alkaline Phosphatase: 106 U/L (ref 38–126)
Anion gap: 7 (ref 5–15)
BUN: 13 mg/dL (ref 8–23)
CO2: 30 mmol/L (ref 22–32)
Calcium: 9.6 mg/dL (ref 8.9–10.3)
Chloride: 103 mmol/L (ref 98–111)
Creatinine: 0.79 mg/dL (ref 0.44–1.00)
GFR, Estimated: >60 mL/min (ref >60)
Glucose, Bld: 126 mg/dL — ABNORMAL HIGH (ref 70–99)
Potassium: 4.1 mmol/L (ref 3.5–5.1)
Sodium: 140 mmol/L (ref 135–145)
Total Bilirubin: 0.3 mg/dL (ref 0.0–1.2)
Total Protein: 6.9 g/dL (ref 6.5–8.1)

## 2024-06-02 LAB — CBC WITH DIFFERENTIAL (CANCER CENTER ONLY)
Abs Immature Granulocytes: 0.01 K/uL (ref 0.00–0.07)
Basophils Absolute: 0 K/uL (ref 0.0–0.1)
Basophils Relative: 1 %
Eosinophils Absolute: 0.1 K/uL (ref 0.0–0.5)
Eosinophils Relative: 2 %
HCT: 32.5 % — ABNORMAL LOW (ref 36.0–46.0)
Hemoglobin: 10.1 g/dL — ABNORMAL LOW (ref 12.0–15.0)
Immature Granulocytes: 0 %
Lymphocytes Relative: 33 %
Lymphs Abs: 1.3 K/uL (ref 0.7–4.0)
MCH: 24.8 pg — ABNORMAL LOW (ref 26.0–34.0)
MCHC: 31.1 g/dL (ref 30.0–36.0)
MCV: 79.7 fL — ABNORMAL LOW (ref 80.0–100.0)
Monocytes Absolute: 0.4 K/uL (ref 0.1–1.0)
Monocytes Relative: 9 %
Neutro Abs: 2.2 K/uL (ref 1.7–7.7)
Neutrophils Relative %: 55 %
Platelet Count: 241 K/uL (ref 150–400)
RBC: 4.08 MIL/uL (ref 3.87–5.11)
RDW: 13.8 % (ref 11.5–15.5)
WBC Count: 4 K/uL (ref 4.0–10.5)
nRBC: 0 % (ref 0.0–0.2)

## 2024-06-04 ENCOUNTER — Other Ambulatory Visit: Payer: Self-pay | Admitting: Internal Medicine

## 2024-06-04 DIAGNOSIS — C3490 Malignant neoplasm of unspecified part of unspecified bronchus or lung: Secondary | ICD-10-CM

## 2024-06-06 ENCOUNTER — Encounter: Payer: Self-pay | Admitting: Internal Medicine

## 2024-06-09 ENCOUNTER — Inpatient Hospital Stay: Admitting: Internal Medicine

## 2024-06-09 ENCOUNTER — Encounter: Admitting: Physical Therapy

## 2024-06-09 ENCOUNTER — Encounter: Payer: Self-pay | Admitting: Physical Therapy

## 2024-06-09 ENCOUNTER — Ambulatory Visit: Admitting: Physical Therapy

## 2024-06-09 VITALS — BP 134/75 | HR 83 | Temp 97.7°F | Resp 17 | Ht 60.0 in | Wt 126.0 lb

## 2024-06-09 DIAGNOSIS — M6281 Muscle weakness (generalized): Secondary | ICD-10-CM | POA: Insufficient documentation

## 2024-06-09 DIAGNOSIS — C3490 Malignant neoplasm of unspecified part of unspecified bronchus or lung: Secondary | ICD-10-CM | POA: Diagnosis not present

## 2024-06-09 DIAGNOSIS — R269 Unspecified abnormalities of gait and mobility: Secondary | ICD-10-CM | POA: Insufficient documentation

## 2024-06-09 DIAGNOSIS — C3411 Malignant neoplasm of upper lobe, right bronchus or lung: Secondary | ICD-10-CM | POA: Diagnosis not present

## 2024-06-09 NOTE — Therapy (Signed)
 OUTPATIENT PHYSICAL THERAPY FEMALE PELVIC EVALUATION   Patient Name: Hannah [name] MRN: [id number] DOB:Dec 09, 1950, 73 y.o., female Today's Date: 06/09/2024  END OF SESSION:  PT End of Session - 06/09/24 1502     Visit Number 2    Date for Recertification  06/28/24    Authorization Type Health team MCR no auth req    Authorization - Number of Visits 2    Progress Note Due on Visit 10    PT Start Time 1500    PT Stop Time 1545    PT Time Calculation (min) 45 min    Activity Tolerance Patient tolerated treatment well;Patient limited by pain    Behavior During Therapy [facility] for tasks assessed/performed          Past Medical History:  Diagnosis Date   Allergy    Anemia    Anxiety    Arthritis    Carpal tunnel syndrome    Constipation    senna C stool softeners help    Depression    Diverticulosis    Dyslipidemia    External hemorrhoids    GERD (gastroesophageal reflux disease)    Heart murmur    mild-moderate AR   Hiatal hernia    Hyperlipidemia    on meds    Hypertension    Internal hemorrhoids    lung ca with brain mets 07/2020   Osteoarthritis    Pre-diabetes    PVD (peripheral vascular disease)    moderate carotid disease   RBBB    Smoker    Vocal cord polyps    Past Surgical History:  Procedure Laterality Date   ABDOMINAL HYSTERECTOMY  1994   APPLICATION OF CRANIAL NAVIGATION N/A 08/24/2020   Procedure: APPLICATION OF CRANIAL NAVIGATION;  Surgeon: Cheryle Debby LABOR, MD;  Location: [facility] OR;  Service: Neurosurgery;  Laterality: N/A;   COLONOSCOPY     COLONOSCOPY W/ POLYPECTOMY  2009   CRANIOTOMY Left 08/24/2020   Procedure: Left Craniotomy for Tumor Resection with Brainlab;  Surgeon: Cheryle Debby LABOR, MD;  Location: [facility] OR;  Service: Neurosurgery;  Laterality: Left;   HEMORRHOID SURGERY     INTERCOSTAL NERVE BLOCK Right 09/24/2020   Procedure: INTERCOSTAL NERVE BLOCK;  Surgeon: Kerrin Elspeth BROCKS, MD;  Location: [facility] OR;  Service: Thoracic;  Laterality:  Right;   IR IMAGING GUIDED PORT INSERTION  05/15/2022   JOINT REPLACEMENT     LYMPH NODE DISSECTION Right 09/24/2020   Procedure: LYMPH NODE DISSECTION;  Surgeon: Kerrin Elspeth BROCKS, MD;  Location: [facility] OR;  Service: Thoracic;  Laterality: Right;   POLYPECTOMY     right total knee arthroplasty     Dr. Johann 06-04-18   SPINE SURGERY  08/16/2009   TOTAL KNEE ARTHROPLASTY Left 02/13/2014   Procedure: TOTAL KNEE ARTHROPLASTY;  Surgeon: Norleen LITTIE Gavel, MD;  Location: [facility] OR;  Service: Orthopedics;  Laterality: Left;   TOTAL KNEE ARTHROPLASTY Right 06/04/2018   Procedure: RIGHT TOTAL KNEE ARTHROPLASTY;  Surgeon: Gavel Norleen, MD;  Location: [facility] ORS;  Service: Orthopedics;  Laterality: Right;  Adductor Block   TUBAL LIGATION     Patient Active Problem List   Diagnosis Date Noted   Excessive cerumen in ear canal, bilateral 04/20/2024   Back pain 02/03/2024   Generalized osteoarthritis of hand 12/01/2023   Paresthesia of left foot 12/01/2023   Port-A-Cath in place 08/14/2022   Achilles tendonitis 06/27/2022   Nasal congestion 03/21/2022   PAD (peripheral artery disease) 03/08/2022   Bilateral shoulder pain 12/11/2021  Fatigue 12/11/2021   Non-small cell lung cancer metastatic to brain (HCC) 09/18/2021   Chest pain 08/30/2021   Urinary frequency 08/30/2021   Acute cystitis 07/16/2021   Vasogenic brain edema (HCC) 04/22/2021   Seizure (HCC) 04/22/2021   Nonrheumatic aortic valve insufficiency 04/18/2021   Allergic rhinitis 02/21/2021   Encounter for antineoplastic chemotherapy 10/23/2020   Encounter for antineoplastic immunotherapy 10/23/2020   Anemia 10/19/2020   Sleep difficulties 10/19/2020   S/P robot-assisted surgical procedure 09/24/2020   Aortic atherosclerosis 09/12/2020   Status post craniotomy 08/24/2020   Rectal bleeding 08/17/2020   Primary cancer of right upper lobe of lung (HCC) 08/07/2020   Primary malignant neoplasm of lung with metastasis to brain (HCC) 08/06/2020    Memory changes 07/24/2020   Non-recurrent unilateral inguinal hernia without obstruction or gangrene 07/24/2020   Pain of right clavicle 09/12/2019   Nonspecific abnormal electrocardiogram (ECG) (EKG) 10/20/2018   Depression 07/17/2018   Primary osteoarthritis of right knee 06/04/2018   Bilateral carotid artery stenosis 09/24/2017   Aortic valve sclerosis 09/24/2017   Vocal cord polyps 09/07/2017   Type 2 diabetes mellitus with hyperlipidemia (HCC) 03/09/2017   GERD (gastroesophageal reflux disease) 02/27/2016   Anxiety 02/27/2016   S/P total knee replacement 02/13/2014   RBBB 02/08/2014   PVD - bilateral 60-79% carotid strenosis 02/08/2014   Hyperlipidemia associated with type 2 diabetes mellitus (HCC) 11/29/2013   Carpal tunnel syndrome, bilateral 11/23/2013   Murmur- mild -mod AR, mild MR 11/10/2013   Constipation 12/16/2012    PCP: [name], MD   REFERRING PROVIDER: Craig Alan SAUNDERS, PA-C  REFERRING DIAG: [phone] (ICD-10-CM) - Pelvic floor dysfunction in female   THERAPY DIAG:  Muscle weakness (generalized)  Abnormal gait  Rationale for Evaluation and Treatment: Rehabilitation  ONSET DATE: always constipated but worse since 2024  SUBJECTIVE:                                                                                                                                                                                           SUBJECTIVE STATEMENT: Patient had an impaction 2 years ago and now stays on a regimen for fiber. Patient will take her laxative at 4 AM so she can get out of the house.   Eval: Patient reports that she is constipated, has been taking Miralax , Linzess , Milk of magnesia. Linzess  does not work though [employer] a capful of Miralax  in the morning and a capful in the evening and fiber.  Daughter reports that there is always something that is not emptied. Feels like LLQ and RLQ always feels like there is something there. It hurts.  Pain moves  around.  Her back hurts as well with walking.   Fluid intake: drinks a lot water  and juice  FUNCTIONAL LIMITATIONS: walks but has constant pain in her thighs and back  PERTINENT HISTORY:  Medications for current condition: laxatives and fiber Surgeries:  Lung and brain cancer treatment in 07/2020, 2023 Sexual abuse: No  PAIN:  Are you having pain? Yes NPRS scale: 8/10 Pain location: low back, radiates to her thighs, hard to walk - at least 6-8 months  Pain type: throbbing Pain description: constant   Aggravating factors: walking- back pain, LLQ when she is constipated Relieving factors: back medicine- Ibuprofen  PRECAUTIONS: None  RED FLAGS: None   WEIGHT BEARING RESTRICTIONS: No  FALLS:  Has patient fallen in last 6 months? No  OCCUPATION: retired  ACTIVITY LEVEL : walks in the house- and to appointments, lives with her husband  PLOF: Independent  PATIENT GOALS: patient does not know,    BOWEL MOVEMENT: Pain with bowel movement: Yes Type of bowel movement:Type (Bristol Stool Scale) 1-7, Frequency every day, Strain yes, and Splinting no Fully empty rectum: Yes: sometimes with more laxative 06/09/24: Patient reports her stool that is formed will go to the left.  Leakage: No       06/09/24: patient reports she will leak stool when she takes a laxative as she gets the urge.                                                 Pads: Yes: fear of leaking Fiber supplement/laxative Yes   URINATION: Pain with urination: No Fully empty bladder: No                                Stream: Strong, it stops sometimes, then she completes it Urgency: No Frequency:during the day yes                                                         Nocturia: Yes: at least 3    Leakage: none 06/09/24: patient reports she leaks urine primarily at night. She will urinate 4 times at night Pads/briefs: Yes:    INTERCOURSE: not active   PREGNANCY: Vaginal deliveries 3 Tearing No Episiotomy  No C-section deliveries 0 Currently pregnant No  PROLAPSE: None   OBJECTIVE:  Note: Objective measures were completed at Evaluation unless otherwise noted.  DIAGNOSTIC FINDINGS:    PATIENT SURVEYS:  PFIQ-7: 15  COGNITION: Overall cognitive status: Within functional limits for tasks assessed     SENSATION: Light touch: Appears intact  LUMBAR SPECIAL TESTS:    FUNCTIONAL TESTS:   Single leg stance: NT   Sit-up test: very difficult, uses arms Squat:difficult Bed mobility: difficult  GAIT: Assistive device utilized: None Comments: slow and wide steps  POSTURE: rounded shoulders, forward head, and decreased lumbar lordosis   LUMBARAROM/PROM:   A/PROM A/PROM  Eval (% available)  Flexion 50  Extension 50  Right lateral flexion 50  Left lateral flexion 50  Right rotation 50  Left rotation 50   (Blank rows = not tested)  LOWER EXTREMITY ROM: tightness present throughout   LOWER  EXTREMITY MMT: 4/5 bilateral hips, 4+/5 bilateral knees  PALPATION:  General: upper chest breathing  Pelvic Alignment: even  Abdominal: restrictions throughout abdomen  Diastasis: Yes: 1 finger  Distortion: Yes   Breathing: upper chest Scar tissue: Yes: abdominal and lumbar spinetight and restricted                External Perineal Exam: to be assessed                             Internal Pelvic Floor: to be assessed  Patient confirms identification and approves PT to assess internal pelvic floor and treatment No  PELVIC MMT:   MMT eval  Vaginal   Internal Anal Sphincter   External Anal Sphincter   Puborectalis   Diastasis Recti 1 finger at and above umbilicus with coning  (Blank rows = not tested)        TONE: to be assessed  PROLAPSE: to be assessed  TODAY'S TREATMENT:     06/09/24 Manual: Soft tissue mobilization: Circular massage tot he abdomen to promote peristalic motion of the intestines to assist with bowel movement Manual work to the diaphragm to  assist with diaphragmatic breathing Scar tissue mobilization: Manual work to the lower abdominal scar Myofascial release: Facial release around the abdomen working on restrictions, along the area of the bladder and colon, along the area of the mesenteric root Neuromuscular re-education: Down training: Diaphragmatic breathing in supine to assist with relaxation of the pelvic floor. She needs tactile cues  to not lift the upper rib cage and practice pushing air into the lower abdomen Self-care: Educated patient on the pelvic floor, how the muscles work with bowel continence, how they relax to push the stool out, how the area of the rectum does stool sampling Educated patient on abdominal massage to assist with bowel movement                                                                                                                              DATE: 03/29/2024  EVAL  Examination completed, findings reviewed, pt educated on POC, HEP, and female pelvic floor anatomy, reasoning with pelvic floor assessment internally. Pt motivated to participate in PT and agreeable to attempt recommendations.     PATIENT EDUCATION:  06/09/24 Education details:  Access Code: [email or web address] Person educated: Patient Education method: Explanation, Demonstration, Actor cues, Verbal cues, and Handouts Education comprehension: verbalized understanding, returned demonstration, verbal cues required, tactile cues required, and needs further education  HOME EXERCISE PROGRAM: 06/09/24 Access Code: [email or web address] URL: [email or web address] Date: 06/09/2024 Prepared by: Channing Pereyra  Exercises - Supine Diaphragmatic Breathing  - 3 x daily - 7 x weekly - 1 sets - 10 reps  Patient Education - Abdominal Massage for Constipation    ASSESSMENT:  CLINICAL IMPRESSION: Patient is a 73 y.o. F who was seen today for physical therapy  and treatment for chronic constipation. Patient  will not leave her home often  due to the loose stools from taking the fiber supplements. She has stool leakage and has to wear depends due to this issue. She will leak urine at night and has to go to the bathroom 4 times at night. She had significant tightness in the lower abdomen along the scar area. She had increased mobility of the abdominal tissue after the manual work and abdomen felt softer.  She will benefit from PT to address deficits.   OBJECTIVE IMPAIRMENTS: Abnormal gait, decreased balance, decreased coordination, decreased endurance, decreased knowledge of condition, decreased mobility, difficulty walking, decreased ROM, decreased strength, hypomobility, increased fascial restrictions, increased muscle spasms, impaired sensation, impaired tone, and pain.   ACTIVITY LIMITATIONS: carrying, lifting, transfers, bed mobility, continence, toileting, and locomotion level  PARTICIPATION LIMITATIONS: community activity and church  PERSONAL FACTORS: Age, Behavior pattern, Time since onset of injury/illness/exacerbation, and Transportation are also affecting patient's functional outcome.   REHAB POTENTIAL: Good  CLINICAL DECISION MAKING: Evolving/moderate complexity  EVALUATION COMPLEXITY: Moderate   GOALS: Goals reviewed with patient? Yes  SHORT TERM GOALS: Target date: 04/26/2024    Pt will be independent with HEP.   Baseline: Goal status: INITIAL  2.  Pt will be independent with use of squatty potty, relaxed toileting mechanics, and improved bowel movement techniques in order to increase ease of bowel movements and complete evacuation.   Baseline:  Goal status: INITIAL  3.   Pt will demonstrate appropriate lateral rib cage excursion with inhale to ensure better abdominal pressure management and pelvic floor/abdominal muscle relaxation.   Baseline:  Goal status: INITIAL  4.  Patient will be educated on abdominal massage Baseline:  Goal status: Met 06/09/24  5.  Pt will have reduced low back pain to max  1/10 in order to be able to do functional activities such as bending, lifting, twisting and walking as needed to be able to take care of her family and participate in church and community activities Baseline:  Goal status: INITIAL   LONG TERM GOALS: Target date: 06/21/2024    Pt will demonstrate 20 point improvement in PFIQ-7 score in order to show functional improvement in bowel symptoms.   Baseline:  Goal status: INITIAL  2.   Pt will be I and consistent with her HEP and demonstrate all exercises correctly  Baseline:  Goal status: INITIAL  3.  Pt will be able to have a bowel movement within 2-3 minutes so she is not straining and adding pressure on the pelvic floor due to the ability to lengthen the tissue and generate  pressure to push the stool out  Baseline:  Goal status: INITIAL  4.  Patient will report bristol stool scale 3-4  Baseline:  Goal status: INITIAL  5.  Patient will be I with PT healthy Bowel PT recommendations Baseline:  Goal status: INITIAL  6.  Patient will report regular bowel movements - at least 5 times/ week Baseline:  Goal status: INITIAL  PLAN:  PT FREQUENCY: 1-2x/week  PT DURATION: 12 weeks  PLANNED INTERVENTIONS: 97110-Therapeutic exercises, 97530- Therapeutic activity, 97112- Neuromuscular re-education, 97535- Self Care, [id number]- Manual therapy, [phone]- Electrical stimulation (manual), [phone]- Ionotophoresis 4mg /ml Dexamethasone , 79439 (1-2 muscles), 20561 (3+ muscles)- Dry Needling, Patient/Family education, Balance training, Taping, Joint mobilization, Joint manipulation, Spinal manipulation, Spinal mobilization, Manual lymph drainage, Scar mobilization, Cryotherapy, Moist heat, and Biofeedback  PLAN FOR NEXT SESSION: internal pelvic floor muscle assessment, core coordination exercises, diaphragmatic breathing  Channing Pereyra, PT 06/09/2024 4:00 PM

## 2024-06-09 NOTE — Progress Notes (Signed)
 [facility] [phone]   Fax:([phone]  OFFICE PROGRESS NOTE  [name], MD [address] [place] [address]  DIAGNOSIS: Stage IV non-small cell lung cancer (T3, N0, M1C) adenocarcinoma.  The patient presented with a and solitary brain metastasis.  The patient was diagnosed in February 2022.  Biomarker Findings Microsatellite status - MS-Stable Tumor Mutational Burden - 8 Muts/Mb Genomic Findings For a complete list of the genes assayed, please refer to the Appendix. KRAS G12V KEAP1 S224F TP53 P151T 7 Disease relevant genes with no reportable alterations: ALK, BRAF, EGFR, ERBB2, MET, RET, ROS1  PDL1 Expression: 50%    PRIOR THERAPY: 1) SRS to the metastatic brain lesion on 08/23/2020 under the care of Dr. Patrcia and craniotomy under the care of Dr. Cheryle scheduled for 08/24/2020. 2) S/p robotic assisted right upper lobectomy with en bloc wedge resection of the right middle lobe and lymph node dissection under the care of Dr. Kerrin on September 24, 2020. 3) status post SRS to a new subcentimeter brain lesions under the care of Dr. Dewey. 4) SRS to brain metastasis. 5) Systemic chemotherapy with carboplatin  for AUC of 5, Alimta  500 Mg/M2 and Keytruda  200 mg IV every 3 weeks.  First dose Nov 07, 2020.  Status post 35 cycles.  Starting from cycle #5 the patient will be on maintenance treatment with Alimta  and Keytruda  every 3 weeks.   CURRENT THERAPY: Observation.  INTERVAL HISTORY: Hannah [name] 73 y.o. female returns to the clinic today for follow-up visit. Discussed the use of AI scribe software for clinical note transcription with the patient, who gave verbal consent to proceed.  History of Present Illness Hannah [name] is a 73 year old female with stage IV small cell lung cancer and adenocarcinoma, status post stereotactic radiosurgery to brain metastasis, presenting for routine follow-up and restaging imaging.  She  was diagnosed with stage IV small cell lung cancer and adenocarcinoma in February 2022, with brain metastasis managed by stereotactic radiosurgery. She completed two years of trastuzumab therapy and has been under observation for over one year without active systemic therapy.  She remains asymptomatic, denying chest pain, dyspnea, nausea, vomiting, diarrhea, or headaches. She specifically notes absence of gastrointestinal symptoms. There have been no new symptoms or disease-related complaints since her last visit.  Her vascular access port remains in place and functional.    MEDICAL HISTORY: Past Medical History:  Diagnosis Date   Allergy    Anemia    Anxiety    Arthritis    Carpal tunnel syndrome    Constipation    senna C stool softeners help    Depression    Diverticulosis    Dyslipidemia    External hemorrhoids    GERD (gastroesophageal reflux disease)    Heart murmur    mild-moderate AR   Hiatal hernia    Hyperlipidemia    on meds    Hypertension    Internal hemorrhoids    lung ca with brain mets 07/2020   Osteoarthritis    Pre-diabetes    PVD (peripheral vascular disease)    moderate carotid disease   RBBB    Smoker    Vocal cord polyps     ALLERGIES:  is allergic to amlodipine, chantix [varenicline tartrate], clarithromycin, lisinopril, simvastatin, wellbutrin  [bupropion  hcl], lipitor [atorvastatin], and sertraline .  MEDICATIONS:  Current Outpatient Medications  Medication Sig Dispense Refill   acetaminophen  (TYLENOL ) 325 MG tablet Take 325 mg by mouth every 6 (  six) hours as needed for moderate pain.     ALPRAZolam  (XANAX  XR) 0.5 MG 24 hr tablet TAKE 1 TABLET BY MOUTH IN THE MORNING 30 tablet 0   ALPRAZolam  (XANAX ) 0.5 MG tablet Take 1 tablet (0.5 mg total) by mouth 3 (three) times daily as needed. for anxiety 90 tablet 5   aspirin  81 MG tablet Take 1 tablet (81 mg total) by mouth daily. Restart on 08/31/20 30 tablet    bisacodyl  (DULCOLAX) 5 MG EC tablet Take 5  mg by mouth daily as needed for moderate constipation.     carboxymethylcellulose (REFRESH PLUS) 0.5 % SOLN Place 1-2 drops into both eyes 3 (three) times daily as needed (dry eyes).     Cholecalciferol (D3 PO) Take 1 capsule by mouth daily.     citalopram  (CELEXA ) 10 MG tablet Take 1 tablet by mouth once daily 90 tablet 0   fluticasone  (FLONASE ) 50 MCG/ACT nasal spray Place 2 sprays into both nostrils daily. 48 g 3   folic acid  (FOLVITE ) 1 MG tablet Take 1 tablet by mouth once daily 30 tablet 5   hydrocortisone  (ANUSOL -HC) 25 MG suppository Place 1 suppository (25 mg total) rectally 2 (two) times daily. 12 suppository 0   Hyoscyamine  Sulfate SL (LEVSIN /SL) 0.125 MG SUBL Take 1 tablet by mouth every 4-6 hours as needed for abdominal cramping abdominal pain 120 tablet 11   ketoconazole  (NIZORAL ) 2 % cream Apply 1 Application topically daily. 30 g 0   Lacosamide  100 MG TABS Take 1 tablet by mouth twice daily 60 tablet 0   levETIRAcetam  (KEPPRA ) 100 MG/ML solution TAKE 5 ML BY MOUTH  TWICE DAILY 473 mL 0   lidocaine -prilocaine  (EMLA ) cream Apply 1 Application topically as needed. 30 g 2   linaclotide  (LINZESS ) 145 MCG CAPS capsule Take 1 capsule (145 mcg total) by mouth daily before breakfast. 30 capsule 6   loratadine  (CLARITIN ) 10 MG tablet Take 10 mg by mouth daily as needed for allergies.     magnesium  hydroxide (MILK OF MAGNESIA) 800 MG/5ML suspension Take 30 mLs by mouth daily as needed for constipation.     Menthol , Topical Analgesic, (BIOFREEZE EX) Apply 1 application topically as needed (pain).     mirtazapine  (REMERON ) 15 MG tablet TAKE 1 TABLET BY MOUTH AT BEDTIME 90 tablet 1   olopatadine  (PATANOL) 0.1 % ophthalmic solution Place 1 drop into both eyes 2 (two) times daily as needed for allergies.     omeprazole  (PRILOSEC) 40 MG capsule Take 1 capsule by mouth once daily 90 capsule 0   polyethylene glycol (MIRALAX  / GLYCOLAX ) 17 g packet Take 17 g by mouth 2 (two) times daily. 72 each 0    pravastatin  (PRAVACHOL ) 40 MG tablet Take 2 tablets (80 mg total) by mouth daily. 180 tablet 1   Probiotic Product (PROBIOTIC DAILY PO) Take 1 capsule by mouth daily.     prochlorperazine  (COMPAZINE ) 10 MG tablet TAKE 1 TABLET BY MOUTH EVERY 6 HOURS AS NEEDED FOR NAUSEA OR VOMITING 30 tablet 0   simethicone  (MYLICON) 80 MG chewable tablet Chew 1 tablet (80 mg total) by mouth 4 (four) times daily as needed for flatulence (Bloating). 30 tablet 0   No current facility-administered medications for this visit.    SURGICAL HISTORY:  Past Surgical History:  Procedure Laterality Date   ABDOMINAL HYSTERECTOMY  1994   APPLICATION OF CRANIAL NAVIGATION N/A 08/24/2020   Procedure: APPLICATION OF CRANIAL NAVIGATION;  Surgeon: Cheryle Debby LABOR, MD;  Location: [facility] OR;  Service: Neurosurgery;  Laterality: N/A;   COLONOSCOPY     COLONOSCOPY W/ POLYPECTOMY  2009   CRANIOTOMY Left 08/24/2020   Procedure: Left Craniotomy for Tumor Resection with Brainlab;  Surgeon: Cheryle Debby LABOR, MD;  Location: [facility] OR;  Service: Neurosurgery;  Laterality: Left;   HEMORRHOID SURGERY     INTERCOSTAL NERVE BLOCK Right 09/24/2020   Procedure: INTERCOSTAL NERVE BLOCK;  Surgeon: Kerrin Elspeth BROCKS, MD;  Location: [facility] OR;  Service: Thoracic;  Laterality: Right;   IR IMAGING GUIDED PORT INSERTION  05/15/2022   JOINT REPLACEMENT     LYMPH NODE DISSECTION Right 09/24/2020   Procedure: LYMPH NODE DISSECTION;  Surgeon: Kerrin Elspeth BROCKS, MD;  Location: [facility] OR;  Service: Thoracic;  Laterality: Right;   POLYPECTOMY     right total knee arthroplasty     Dr. Johann 06-04-18   SPINE SURGERY  08/16/2009   TOTAL KNEE ARTHROPLASTY Left 02/13/2014   Procedure: TOTAL KNEE ARTHROPLASTY;  Surgeon: Norleen LITTIE Gavel, MD;  Location: [facility] OR;  Service: Orthopedics;  Laterality: Left;   TOTAL KNEE ARTHROPLASTY Right 06/04/2018   Procedure: RIGHT TOTAL KNEE ARTHROPLASTY;  Surgeon: Gavel Norleen, MD;  Location: [facility] ORS;  Service: Orthopedics;   Laterality: Right;  Adductor Block   TUBAL LIGATION      REVIEW OF SYSTEMS:  Constitutional: negative Eyes: negative Ears, nose, mouth, throat, and face: negative Respiratory: negative Cardiovascular: negative Gastrointestinal: negative Genitourinary:negative Integument/breast: negative Hematologic/lymphatic: negative Musculoskeletal:negative Neurological: negative Behavioral/Psych: negative Endocrine: negative Allergic/Immunologic: negative   PHYSICAL EXAMINATION: General appearance: alert, cooperative, fatigued, and no distress Head: Normocephalic, without obvious abnormality, atraumatic Neck: no adenopathy, no JVD, supple, symmetrical, trachea midline, and thyroid  not enlarged, symmetric, no tenderness/mass/nodules Lymph nodes: Cervical, supraclavicular, and axillary nodes normal. Resp: clear to auscultation bilaterally Back: symmetric, no curvature. ROM normal. No CVA tenderness. Cardio: regular rate and rhythm, S1, S2 normal, no murmur, click, rub or gallop GI: soft, non-tender; bowel sounds normal; no masses,  no organomegaly Extremities: extremities normal, atraumatic, no cyanosis or edema Neurologic: Alert and oriented X 3, normal strength and tone. Normal symmetric reflexes. Normal coordination and gait  ECOG PERFORMANCE STATUS: 1 - Symptomatic but completely ambulatory  Blood pressure 134/75, pulse 83, temperature 97.7 F (36.5 C), temperature source Temporal, resp. rate 17, height 5' (1.524 m), weight 126 lb (57.2 kg), SpO2 100%.  LABORATORY DATA: Lab Results  Component Value Date   WBC 4.0 06/02/2024   HGB 10.1 (L) 06/02/2024   HCT 32.5 (L) 06/02/2024   MCV 79.7 (L) 06/02/2024   PLT 241 06/02/2024      Chemistry      Component Value Date/Time   NA 140 06/02/2024 0732   K 4.1 06/02/2024 0732   CL 103 06/02/2024 0732   CO2 30 06/02/2024 0732   BUN 13 06/02/2024 0732   CREATININE 0.79 06/02/2024 0732   CREATININE 0.79 03/14/2020 1016      Component  Value Date/Time   CALCIUM  9.6 06/02/2024 0732   ALKPHOS 106 06/02/2024 0732   AST 25 06/02/2024 0732   ALT 12 06/02/2024 0732   BILITOT 0.3 06/02/2024 0732       RADIOGRAPHIC STUDIES: CT CHEST ABDOMEN PELVIS W CONTRAST Result Date: 05/27/2024 EXAM: CT CHEST, ABDOMEN AND PELVIS WITH CONTRAST 05/23/2024 10:07:23 AM TECHNIQUE: CT of the chest, abdomen and pelvis was performed with the administration of 100 mL iohexol  (OMNIPAQUE ) 300 MG/ML solution. Multiplanar reformatted images are provided for review. Automated exposure control, iterative reconstruction, and/or weight based adjustment of the  mA/kV was utilized to reduce the radiation dose to as low as reasonably achievable. COMPARISON: CT chest, abdomen and pelvis dated 01/25/2024. CLINICAL HISTORY: Non-small cell lung cancer (NSCLC), staging. * Tracking Code: BO * FINDINGS: CHEST: MEDIASTINUM AND LYMPH NODES: Right internal jugular port a cath terminates in the right atrium, unchanged. Left anterior descending and left circumflex coronary atherosclerosis. Atherosclerotic nonaneurysmal thoracic aorta. Normal caliber main pulmonary artery. Heart and pericardium are otherwise unremarkable. The central airways are clear. No mediastinal, hilar or axillary lymphadenopathy. LUNGS AND PLEURA: Mild paraseptal emphysema. Status post right upper lobectomy and right middle lobe wedge resection. No acute consolidative airspace disease. Tiny 0.3 cm solid left upper lobe (series 4, image 75) and left lower lobe (image 70) pulmonary nodules are stable. No new significant pulmonary nodules. No pleural effusion or pneumothorax. ABDOMEN AND PELVIS: LIVER: The liver is unremarkable. GALLBLADDER AND BILE DUCTS: Cholelithiasis. No biliary ductal dilatation. SPLEEN: No acute abnormality. PANCREAS: No acute abnormality. ADRENAL GLANDS: Left adrenal 1.2 cm nodule with density 148 HU, stable back to at least 06/10/2021 CT, compatible with a benign adenoma. Normal right adrenal.  KIDNEYS, URETERS AND BLADDER: No stones in the kidneys or ureters. No hydronephrosis. No perinephric or periureteral stranding. Urinary bladder is unremarkable. GI AND BOWEL: Stomach demonstrates no acute abnormality. No dilated or thick walled small bowel loops. Calcified appendicoliths in the distal appendix, unchanged. Otherwise normal appendix. Mild left colonic diverticulosis with no large bowel wall thickening. REPRODUCTIVE ORGANS: Hysterectomy. No adnexal masses. PERITONEUM AND RETROPERITONEUM: No ascites. No free air. VASCULATURE: Atherosclerotic abdominal aorta with dilated 2.7 cm infrarenal abdominal aorta, unchanged. ABDOMINAL AND PELVIS LYMPH NODES: No lymphadenopathy. BONES AND SOFT TISSUES: Multilevel degenerative disc disease throughout the thoracic and lumbar spine. Disc spacers at L3-L4 and L4-L5 with spinous process fixation hardware in the lower lumbar spine. No acute osseous abnormality. No focal soft tissue abnormality. IMPRESSION: 1. No evidence of metastatic disease in the chest, abdomen or pelvis. 2. Status post right upper lobectomy and right middle lobe wedge resection with no evidence of local tumor recurrence. 3. Stable dilated 2.7 cm infrarenal abdominal aorta, for which attention is suggested on routine follow-up oncologic imaging studies. 4. Aortic Atherosclerosis (ICD10-I70.0). 5. Emphysema (ICD10-J43.9). 6. Aortic aneurysm NOS (ICD10-I71.9). Electronically signed by: Selinda Blue MD 05/27/2024 01:19 PM EST RP Workstation: [id number]     ASSESSMENT AND PLAN: This is a very pleasant 73 years old African-American female diagnosed with stage IV (T3, N0, M1b) non-small cell lung cancer, adenocarcinoma presented with right upper lobe lung mass with solitary brain metastasis diagnosed in February 2022 status post SRS to the brain lesion followed by craniotomy and resection. The patient also has a solitary lung mass. S/p robotic assisted right upper lobectomy with en bloc wedge resection  of the right middle lobe and lymph node dissection under the care of Dr. Kerrin on September 24, 2020. She also underwent SRS treatment to a new subcentimeter brain lesions under the care of Dr. Dewey She underwent systemic chemotherapy with carboplatin  for AUC of 5, Alimta  500 Mg/M2 and Keytruda  200 Mg IV every 3 weeks status post 35 cycles.  Starting from cycle #5 the patient will be on maintenance treatment with Alimta  and Keytruda  every 3 weeks.   The patient tolerated her previous treatment fairly well with no concerning adverse effect except for fatigue.  She completed 2 years of treatment with Keytruda . The patient is currently on observation and she is feeling fine. The patient had repeat CT scan of the  chest, abdomen and pelvis performed recently.  I personally independently reviewed the scan and discussed the results with the patient today.  Her scan showed no concerning findings for disease progression. Assessment and Plan Assessment & Plan Stage IV small cell lung cancer with brain metastases She has advanced small cell lung cancer with brain metastases, previously treated with stereotactic radiosurgery and trastuzumab. Restaging CT scans of the chest, abdomen, and pelvis showed no evidence of disease progression or new metastases. She remains asymptomatic and clinically well-managed. - Reviewed restaging CT scan results of chest, abdomen, and pelvis. - Provided copy of imaging results to her. - Scheduled follow-up in four months with repeat imaging for surveillance.  Maintenance of vascular access device Her vascular access port remains functional without complications. - Continued flushing of vascular access port every two months. She was advised to call immediately if she has any other concerning symptoms in the interval. The patient voices understanding of current disease status and treatment options and is in agreement with the current care plan.  All questions were answered.  The patient knows to call the clinic with any problems, questions or concerns. We can certainly see the patient much sooner if necessary. The total time spent in the appointment was 30 minutes.  Disclaimer: This note was dictated with voice recognition software. Similar sounding words can inadvertently be transcribed and may not be corrected upon review.

## 2024-06-10 ENCOUNTER — Inpatient Hospital Stay: Admission: RE | Admit: 2024-06-10 | Discharge: 2024-06-10 | Attending: Internal Medicine

## 2024-06-10 ENCOUNTER — Telehealth: Payer: Self-pay

## 2024-06-10 DIAGNOSIS — C3490 Malignant neoplasm of unspecified part of unspecified bronchus or lung: Secondary | ICD-10-CM

## 2024-06-10 MED ORDER — GADOPICLENOL 0.5 MMOL/ML IV SOLN
5.5000 mL | Freq: Once | INTRAVENOUS | Status: AC | PRN
  Administered 2024-06-10: 10:00:00 5.5 mL via INTRAVENOUS

## 2024-06-10 NOTE — Telephone Encounter (Signed)
 Copied from CRM #[id number]. Topic: Clinical - Medical Advice >> Jun 10, 2024  3:49 PM Franky GRADE wrote: Reason for CRM: Patient had the RSV shot today and wanted to know if she is due for the Hepatitis vaccines or pneumococcal vaccine.  Please call patient to home phone [phone].

## 2024-06-10 NOTE — Telephone Encounter (Signed)
 She is up-to-date with pneumonia vaccine so she does not need any more pneumonia vaccines.  Hepatitis B is recommended in elderly sugar at a higher risk.  I would consider her low risk so she does not have to get it.  She can get it if she wants it.

## 2024-06-13 ENCOUNTER — Inpatient Hospital Stay: Admitting: Internal Medicine

## 2024-06-13 ENCOUNTER — Inpatient Hospital Stay

## 2024-06-13 VITALS — BP 139/71 | HR 77 | Temp 98.2°F | Resp 17 | Ht 60.0 in | Wt 127.0 lb

## 2024-06-13 DIAGNOSIS — C3490 Malignant neoplasm of unspecified part of unspecified bronchus or lung: Secondary | ICD-10-CM | POA: Diagnosis not present

## 2024-06-13 DIAGNOSIS — R569 Unspecified convulsions: Secondary | ICD-10-CM

## 2024-06-13 DIAGNOSIS — D496 Neoplasm of unspecified behavior of brain: Secondary | ICD-10-CM | POA: Diagnosis not present

## 2024-06-13 DIAGNOSIS — C7931 Secondary malignant neoplasm of brain: Secondary | ICD-10-CM | POA: Diagnosis not present

## 2024-06-13 DIAGNOSIS — C3411 Malignant neoplasm of upper lobe, right bronchus or lung: Secondary | ICD-10-CM | POA: Diagnosis not present

## 2024-06-13 MED ORDER — LEVETIRACETAM 100 MG/ML PO SOLN: ORAL | Status: DC

## 2024-06-13 NOTE — Progress Notes (Signed)
 [facility] at [facility] [address]  [place], [place] [address] [phone]   Interval Evaluation  Date of Service: 06/13/2024 Patient Name: Hannah [name] Patient MRN: [id number] Patient DOB: 03/27/1951 Provider: Arthea MARLA Manns, MD  Identifying Statement:  Hannah [name] is a 73 y.o. female with brain metastasis   Primary Cancer:  Oncology History  Primary cancer of right upper lobe of lung (HCC)  08/07/2020 Initial Diagnosis   Primary cancer of right upper lobe of lung (HCC)   09/20/2020 Cancer Staging   Staging form: Lung, AJCC 8th Edition - Clinical: Stage IVB (cT3, cN0, cM1c) - Signed by Sherrod Sherrod, MD on 09/20/2020   11/08/2020 - 01/16/2022 Chemotherapy   Patient is on Treatment Plan : LUNG CARBOplatin  / Pemetrexed  / Pembrolizumab  q21d Induction x 4 cycles / Maintenance Pemetrexed  + Pembrolizumab      11/16/2020 - 11/27/2022 Chemotherapy   Patient is on Treatment Plan : LUNG Carboplatin  (5) + Pemetrexed  (500) + Pembrolizumab  (200) D1 q21d Induction x 4 cycles / Maintenance Pemetrexed  (500) + Pembrolizumab  (200) D1 q21d      Oncologic History 08/23/20: Pre-op SRS to L frontal mass Cinda) 08/24/20: Craniotomy, resection (Ostergard) 11/29/20: Salvage SRS x2 Cinda) 10/14/21: L frontoparietal progression, undergoes LITT at [facility]) 11/18/21: Path is adenocarcinoma, completes 25/5 SRS Cinda)  Interval History:  MARLY SCHULD presents today for follow up after recent MRI brain. She doesn't report any new or progressive changes.  She continues on keppra  500mg  BID and vimpat  100mg  BID without further seizures.  No longer needing any steroids.  Continues on observation with Dr. Sherrod.  H+P (08/02/20) Patient presented to medical attention this past week with several days rapidly progressive speech impairment and right sided weakness.  She and her family describe difficulty putting complete sentences together, impaired use of right arm  (for using fork, zipper, brushing teeth), and dragging of the right leg while walking.  She never needed assistance to walk.  CNS imaging demonstrated left frontal mass, and decadron  was started over the weekend 4mg  twice per day.  She feels improved with regards to the right sided weakness with the steroids.  Otherwise no seizures, headaches.  Medications: Current Outpatient Medications on File Prior to Visit  Medication Sig Dispense Refill   acetaminophen  (TYLENOL ) 325 MG tablet Take 325 mg by mouth every 6 (six) hours as needed for moderate pain.     ALPRAZolam  (XANAX  XR) 0.5 MG 24 hr tablet TAKE 1 TABLET BY MOUTH IN THE MORNING 30 tablet 0   ALPRAZolam  (XANAX ) 0.5 MG tablet Take 1 tablet (0.5 mg total) by mouth 3 (three) times daily as needed. for anxiety 90 tablet 5   aspirin  81 MG tablet Take 1 tablet (81 mg total) by mouth daily. Restart on 08/31/20 30 tablet    bisacodyl  (DULCOLAX) 5 MG EC tablet Take 5 mg by mouth daily as needed for moderate constipation.     carboxymethylcellulose (REFRESH PLUS) 0.5 % SOLN Place 1-2 drops into both eyes 3 (three) times daily as needed (dry eyes).     Cholecalciferol (D3 PO) Take 1 capsule by mouth daily.     citalopram  (CELEXA ) 10 MG tablet Take 1 tablet by mouth once daily 90 tablet 0   fluticasone  (FLONASE ) 50 MCG/ACT nasal spray Place 2 sprays into both nostrils daily. 48 g 3   folic acid  (FOLVITE ) 1 MG tablet Take 1 tablet by mouth once daily 30 tablet 5   hydrocortisone  (ANUSOL -HC) 25 MG  suppository Place 1 suppository (25 mg total) rectally 2 (two) times daily. 12 suppository 0   Hyoscyamine  Sulfate SL (LEVSIN /SL) 0.125 MG SUBL Take 1 tablet by mouth every 4-6 hours as needed for abdominal cramping abdominal pain 120 tablet 11   ketoconazole  (NIZORAL ) 2 % cream Apply 1 Application topically daily. 30 g 0   Lacosamide  100 MG TABS Take 1 tablet by mouth twice daily 60 tablet 0   levETIRAcetam  (KEPPRA ) 100 MG/ML solution TAKE 5 ML BY MOUTH  TWICE DAILY  473 mL 0   lidocaine -prilocaine  (EMLA ) cream Apply 1 Application topically as needed. 30 g 2   linaclotide  (LINZESS ) 145 MCG CAPS capsule Take 1 capsule (145 mcg total) by mouth daily before breakfast. 30 capsule 6   loratadine  (CLARITIN ) 10 MG tablet Take 10 mg by mouth daily as needed for allergies.     magnesium  hydroxide (MILK OF MAGNESIA) 800 MG/5ML suspension Take 30 mLs by mouth daily as needed for constipation.     Menthol , Topical Analgesic, (BIOFREEZE EX) Apply 1 application topically as needed (pain).     mirtazapine  (REMERON ) 15 MG tablet TAKE 1 TABLET BY MOUTH AT BEDTIME 90 tablet 1   olopatadine  (PATANOL) 0.1 % ophthalmic solution Place 1 drop into both eyes 2 (two) times daily as needed for allergies.     omeprazole  (PRILOSEC) 40 MG capsule Take 1 capsule by mouth once daily 90 capsule 0   polyethylene glycol (MIRALAX  / GLYCOLAX ) 17 g packet Take 17 g by mouth 2 (two) times daily. 72 each 0   pravastatin  (PRAVACHOL ) 40 MG tablet Take 2 tablets (80 mg total) by mouth daily. 180 tablet 1   Probiotic Product (PROBIOTIC DAILY PO) Take 1 capsule by mouth daily.     prochlorperazine  (COMPAZINE ) 10 MG tablet TAKE 1 TABLET BY MOUTH EVERY 6 HOURS AS NEEDED FOR NAUSEA OR VOMITING 30 tablet 0   simethicone  (MYLICON) 80 MG chewable tablet Chew 1 tablet (80 mg total) by mouth 4 (four) times daily as needed for flatulence (Bloating). 30 tablet 0   No current facility-administered medications on file prior to visit.    Allergies:  Allergies  Allergen Reactions   Amlodipine Swelling and Rash    Rash, swelling   Chantix [Varenicline Tartrate] Shortness Of Breath, Swelling and Other (See Comments)    Tongue swell,sob   Clarithromycin Rash   Lisinopril Hives   Simvastatin Hives   Wellbutrin  [Bupropion  Hcl] Hives   Lipitor [Atorvastatin]     Dizziness per patient   Sertraline      Makes her feel like she is going to kill someone   Past Medical History:  Past Medical History:  Diagnosis  Date   Allergy    Anemia    Anxiety    Arthritis    Carpal tunnel syndrome    Constipation    senna C stool softeners help    Depression    Diverticulosis    Dyslipidemia    External hemorrhoids    GERD (gastroesophageal reflux disease)    Heart murmur    mild-moderate AR   Hiatal hernia    Hyperlipidemia    on meds    Hypertension    Internal hemorrhoids    lung ca with brain mets 07/2020   Osteoarthritis    Pre-diabetes    PVD (peripheral vascular disease)    moderate carotid disease   RBBB    Smoker    Vocal cord polyps    Past Surgical History:  Past Surgical History:  Procedure Laterality Date   ABDOMINAL HYSTERECTOMY  1994   APPLICATION OF CRANIAL NAVIGATION N/A 08/24/2020   Procedure: APPLICATION OF CRANIAL NAVIGATION;  Surgeon: Cheryle Debby LABOR, MD;  Location: [facility] OR;  Service: Neurosurgery;  Laterality: N/A;   COLONOSCOPY     COLONOSCOPY W/ POLYPECTOMY  2009   CRANIOTOMY Left 08/24/2020   Procedure: Left Craniotomy for Tumor Resection with Brainlab;  Surgeon: Cheryle Debby LABOR, MD;  Location: [facility] OR;  Service: Neurosurgery;  Laterality: Left;   HEMORRHOID SURGERY     INTERCOSTAL NERVE BLOCK Right 09/24/2020   Procedure: INTERCOSTAL NERVE BLOCK;  Surgeon: Kerrin Elspeth BROCKS, MD;  Location: [facility] OR;  Service: Thoracic;  Laterality: Right;   IR IMAGING GUIDED PORT INSERTION  05/15/2022   JOINT REPLACEMENT     LYMPH NODE DISSECTION Right 09/24/2020   Procedure: LYMPH NODE DISSECTION;  Surgeon: Kerrin Elspeth BROCKS, MD;  Location: [facility] OR;  Service: Thoracic;  Laterality: Right;   POLYPECTOMY     right total knee arthroplasty     Dr. Johann 06-04-18   SPINE SURGERY  08/16/2009   TOTAL KNEE ARTHROPLASTY Left 02/13/2014   Procedure: TOTAL KNEE ARTHROPLASTY;  Surgeon: Norleen LITTIE Gavel, MD;  Location: [facility] OR;  Service: Orthopedics;  Laterality: Left;   TOTAL KNEE ARTHROPLASTY Right 06/04/2018   Procedure: RIGHT TOTAL KNEE ARTHROPLASTY;  Surgeon: Gavel Norleen, MD;   Location: [facility] ORS;  Service: Orthopedics;  Laterality: Right;  Adductor Block   TUBAL LIGATION     Social History:  Social History   Socioeconomic History   Marital status: Married    Spouse name: Not on file   Number of children: 3   Years of education: Not on file   Highest education level: 12th grade  Occupational History   Not on file  Tobacco Use   Smoking status: Former    Current packs/day: 0.00    Average packs/day: 0.3 packs/day for 40.0 years (10.0 ttl pk-yrs)    Types: Cigarettes    Start date: 07/09/1980    Quit date: 07/09/2020    Years since quitting: 3.9    Passive exposure: Never   Smokeless tobacco: Never   Tobacco comments:    PACK WILL LAST 3 days  Vaping Use   Vaping status: Never Used  Substance and Sexual Activity   Alcohol  use: No   Drug use: No   Sexual activity: Not Currently  Other Topics Concern   Not on file  Social History Narrative   Married   Social Drivers of Health   Tobacco Use: Medium Risk (06/09/2024)   Patient History    Smoking Tobacco Use: Former    Smokeless Tobacco Use: Never    Passive Exposure: Never  Physicist, Medical Strain: Low Risk (04/18/2024)   Overall Financial Resource Strain (CARDIA)    Difficulty of Paying Living Expenses: Not hard at all  Food Insecurity: No Food Insecurity (04/18/2024)   Epic    Worried About Programme Researcher, Broadcasting/film/video in the Last Year: Never true    Ran Out of Food in the Last Year: Never true  Transportation Needs: No Transportation Needs (04/18/2024)   Epic    Lack of Transportation (Medical): No    Lack of Transportation (Non-Medical): No  Physical Activity: Insufficiently Active (04/18/2024)   Exercise Vital Sign    Days of Exercise per Week: 3 days    Minutes of Exercise per Session: 30 min  Stress: No Stress Concern Present (04/18/2024)   [employer] of Occupational Health -  Occupational Stress Questionnaire    Feeling of Stress: Not at all  Social Connections: Socially Integrated  (04/18/2024)   Social Connection and Isolation Panel    Frequency of Communication with Friends and Family: More than three times a week    Frequency of Social Gatherings with Friends and Family: More than three times a week    Attends Religious Services: More than 4 times per year    Active Member of Clubs or Organizations: Yes    Attends Banker Meetings: More than 4 times per year    Marital Status: Married  Catering Manager Violence: Not At Risk (04/18/2024)   Epic    Fear of Current or Ex-Partner: No    Emotionally Abused: No    Physically Abused: No    Sexually Abused: No  Depression (PHQ2-9): Low Risk (04/20/2024)   Depression (PHQ2-9)    PHQ-2 Score: 0  Alcohol  Screen: Low Risk (04/18/2024)   Alcohol  Screen    Last Alcohol  Screening Score (AUDIT): 0  Housing: Unknown (04/18/2024)   Epic    Unable to Pay for Housing in the Last Year: No    Number of Times Moved in the Last Year: Not on file    Homeless in the Last Year: No  Utilities: Not At Risk (04/18/2024)   Epic    Threatened with loss of utilities: No  Health Literacy: Inadequate Health Literacy (04/18/2024)   B1300 Health Literacy    Frequency of need for help with medical instructions: Always   Family History:  Family History  Problem Relation Age of Onset   Dementia Mother    Prostate cancer Father    Diabetes Sister    Cancer Sister    Stroke Maternal Grandfather    Colitis Maternal Aunt    Breast cancer Maternal Aunt    Colon cancer Neg Hx    Colon polyps Neg Hx    Rectal cancer Neg Hx    Stomach cancer Neg Hx    Esophageal cancer Neg Hx     Review of Systems: Constitutional: Doesn't report fevers, chills or abnormal weight loss Eyes: Doesn't report blurriness of vision Ears, nose, mouth, throat, and face: Doesn't report sore throat Respiratory: Doesn't report cough, dyspnea or wheezes Cardiovascular: Doesn't report palpitation, chest discomfort  Gastrointestinal:  Doesn't report  nausea, constipation, diarrhea GU: Doesn't report incontinence Skin: Doesn't report skin rashes Neurological: Per HPI Musculoskeletal: Doesn't report joint pain Behavioral/Psych: ++anxiety  Physical Exam: Wt Readings from Last 3 Encounters:  06/09/24 126 lb (57.2 kg)  04/20/24 126 lb (57.2 kg)  04/18/24 125 lb (56.7 kg)   Temp Readings from Last 3 Encounters:  06/09/24 97.7 F (36.5 C) (Temporal)  04/20/24 98.2 F (36.8 C) (Oral)  02/03/24 98 F (36.7 C) (Oral)   BP Readings from Last 3 Encounters:  06/09/24 134/75  04/20/24 130/78  04/13/24 122/60   Pulse Readings from Last 3 Encounters:  06/09/24 83  04/20/24 (!) 55  04/13/24 68     KPS: 80. General: Alert, cooperative, pleasant, in no acute distress Head: Normal EENT: No conjunctival injection or scleral icterus.  Lungs: Resp effort normal Cardiac: Regular rate Abdomen: Non-distended abdomen Skin: No rashes cyanosis or petechiae. Extremities: No clubbing or edema  Neurologic Exam: Mental Status: Awake, alert, attentive to examiner. Oriented to self and environment. Language is intact with regards to fluency, comprehension.  Psychomotor slowing, impaired recall. Cranial Nerves: Visual acuity is grossly normal. Visual fields are full. Extra-ocular movements intact. No ptosis. Face  is symmetric Motor: Tone and bulk are normal. Power is 5/5 throughout. Reflexes are symmetric, no pathologic reflexes present.  Sensory: Intact to light touch Gait: Independent  Labs: I have reviewed the data as listed    Component Value Date/Time   NA 140 06/02/2024 0732   K 4.1 06/02/2024 0732   CL 103 06/02/2024 0732   CO2 30 06/02/2024 0732   GLUCOSE 126 (H) 06/02/2024 0732   BUN 13 06/02/2024 0732   CREATININE 0.79 06/02/2024 0732   CREATININE 0.79 03/14/2020 1016   CALCIUM  9.6 06/02/2024 0732   PROT 6.9 06/02/2024 0732   ALBUMIN 4.1 06/02/2024 0732   AST 25 06/02/2024 0732   ALT 12 06/02/2024 0732   ALKPHOS 106  06/02/2024 0732   BILITOT 0.3 06/02/2024 0732   GFRNONAA >60 06/02/2024 0732   GFRNONAA 75 03/14/2020 1016   GFRAA 87 03/14/2020 1016   Lab Results  Component Value Date   WBC 4.0 06/02/2024   NEUTROABS 2.2 06/02/2024   HGB 10.1 (L) 06/02/2024   HCT 32.5 (L) 06/02/2024   MCV 79.7 (L) 06/02/2024   PLT 241 06/02/2024    Imaging:  [facility] Clinician Interpretation: I have personally reviewed the CNS images as listed.  My interpretation, in the context of the patient's clinical presentation, is stable disease  MR BRAIN W WO CONTRAST Result Date: 06/10/2024 EXAM: MRI BRAIN WITH AND WITHOUT CONTRAST 06/10/2024 10:11:09 AM TECHNIQUE: Multiplanar multisequence MRI of the head/brain was performed with and without the administration of intravenous contrast. COMPARISON: Brain MRI 12/17/2023 and earlier. CLINICAL HISTORY: 73 year old female with treated metastatic lung cancer, including surgical resection, stereotactic radiosurgery, and including LITT therapy for progression/recurrence (left parietal/occipital treatment area in April 2023). Restaging. FINDINGS: BRAIN AND VENTRICLES: No acute infarct. No acute intracranial hemorrhage. No mass effect or midline shift. No hydrocephalus. The sella is unremarkable. Normal flow voids. Major dural venous sinuses are enhancing and appear to be patent. Stable brain volume. Stable left vertex craniotomy changes. Stable background gray and white matter signal. Stable appearance of post treatment hemosiderin in the left superior frontal gyrus, left occipital lobe. Smooth postoperative dural thickening and enhancement along the left superior convexity is stable. 1. Stellate enhancement in the treatment area of the lateral left occipital lobe (series 14 image 92) is stable, encompassing roughly 15 mm. Regional T2 and FLAIR hyperintensity is stable with no regional mass effect. 2. Nodular 8 mm solid enhancing lesion at the posterior right temporal lobe (series 14 image 96)  is stable (also series 16 image 15). Regional T2 and FLAIR hyperintensity is stable with no mass effect evident. 3. Left superior frontal gyrus mildly nodular and stellate enhancement in an area of about 1.5 cm (15 mm), is stable (series 14 image 152) with stable patchy regional T2 and FLAIR hyperintensity, no regional mass effect. No new or progressive intracranial enhancement. Stable post treatment appearance of the brain. ORBITS: No acute abnormality. SINUSES: Increased but generally mild paranasal sinus mucosal thickening. Mastoids remain clear. Negative visible internal auditory structures. BONES AND SOFT TISSUES: Stable left vertex craniotomy changes. Stable and negative for age visible cervical spine, spinal cord. Stable visible bone marrow signal. No acute soft tissue abnormality. IMPRESSION: 1. Stable post-treatment appearance of the brain. Three enhancing lesions in total. 2. No new intracranial abnormality. Electronically signed by: Helayne Hurst MD 06/10/2024 10:38 AM EST RP Workstation: [id number]   CT CHEST ABDOMEN PELVIS W CONTRAST Result Date: 05/27/2024 EXAM: CT CHEST, ABDOMEN AND PELVIS WITH CONTRAST 05/23/2024 10:07:23 AM TECHNIQUE:  CT of the chest, abdomen and pelvis was performed with the administration of 100 mL iohexol  (OMNIPAQUE ) 300 MG/ML solution. Multiplanar reformatted images are provided for review. Automated exposure control, iterative reconstruction, and/or weight based adjustment of the mA/kV was utilized to reduce the radiation dose to as low as reasonably achievable. COMPARISON: CT chest, abdomen and pelvis dated 01/25/2024. CLINICAL HISTORY: Non-small cell lung cancer (NSCLC), staging. * Tracking Code: BO * FINDINGS: CHEST: MEDIASTINUM AND LYMPH NODES: Right internal jugular port a cath terminates in the right atrium, unchanged. Left anterior descending and left circumflex coronary atherosclerosis. Atherosclerotic nonaneurysmal thoracic aorta. Normal caliber main pulmonary  artery. Heart and pericardium are otherwise unremarkable. The central airways are clear. No mediastinal, hilar or axillary lymphadenopathy. LUNGS AND PLEURA: Mild paraseptal emphysema. Status post right upper lobectomy and right middle lobe wedge resection. No acute consolidative airspace disease. Tiny 0.3 cm solid left upper lobe (series 4, image 75) and left lower lobe (image 70) pulmonary nodules are stable. No new significant pulmonary nodules. No pleural effusion or pneumothorax. ABDOMEN AND PELVIS: LIVER: The liver is unremarkable. GALLBLADDER AND BILE DUCTS: Cholelithiasis. No biliary ductal dilatation. SPLEEN: No acute abnormality. PANCREAS: No acute abnormality. ADRENAL GLANDS: Left adrenal 1.2 cm nodule with density 148 HU, stable back to at least 06/10/2021 CT, compatible with a benign adenoma. Normal right adrenal. KIDNEYS, URETERS AND BLADDER: No stones in the kidneys or ureters. No hydronephrosis. No perinephric or periureteral stranding. Urinary bladder is unremarkable. GI AND BOWEL: Stomach demonstrates no acute abnormality. No dilated or thick walled small bowel loops. Calcified appendicoliths in the distal appendix, unchanged. Otherwise normal appendix. Mild left colonic diverticulosis with no large bowel wall thickening. REPRODUCTIVE ORGANS: Hysterectomy. No adnexal masses. PERITONEUM AND RETROPERITONEUM: No ascites. No free air. VASCULATURE: Atherosclerotic abdominal aorta with dilated 2.7 cm infrarenal abdominal aorta, unchanged. ABDOMINAL AND PELVIS LYMPH NODES: No lymphadenopathy. BONES AND SOFT TISSUES: Multilevel degenerative disc disease throughout the thoracic and lumbar spine. Disc spacers at L3-L4 and L4-L5 with spinous process fixation hardware in the lower lumbar spine. No acute osseous abnormality. No focal soft tissue abnormality. IMPRESSION: 1. No evidence of metastatic disease in the chest, abdomen or pelvis. 2. Status post right upper lobectomy and right middle lobe wedge  resection with no evidence of local tumor recurrence. 3. Stable dilated 2.7 cm infrarenal abdominal aorta, for which attention is suggested on routine follow-up oncologic imaging studies. 4. Aortic Atherosclerosis (ICD10-I70.0). 5. Emphysema (ICD10-J43.9). 6. Aortic aneurysm NOS (ICD10-I71.9). Electronically signed by: Selinda Blue MD 05/27/2024 01:19 PM EST RP Workstation: [id number]      Assessment/Plan Non-small cell lung cancer metastatic to brain [facility])  Seizure (HCC)  Kyler N Asbury is clinically stable today.  MRI again demonstrates stable findings today.   Will recommend continuing MRI surveillance only.  May dose 800mg  ibuprofen for acute headaches.  Recommended continuing Vimpat  100mg  BID.  May reduce Keppra  to 250mg  BID liquid.  We appreciate the opportunity to participate in the care of Antoine LOISE Hoyle.    We ask that Antoine LOISE Hoyle return to clinic in 6 months following next brain MRI, or sooner as needed.  All questions were answered. The patient knows to call the clinic with any problems, questions or concerns. No barriers to learning were detected.  The total time spent in the encounter was 40 minutes and more than 50% was on counseling and review of test results   Arthea MARLA Manns, MD Medical Director of Neuro-Oncology [facility]  06/13/2024 9:26 AM

## 2024-06-13 NOTE — Telephone Encounter (Signed)
 Message left for patient and spoke with patient's daughter [name] today.

## 2024-06-14 ENCOUNTER — Telehealth: Payer: Self-pay | Admitting: Internal Medicine

## 2024-06-14 NOTE — Telephone Encounter (Signed)
 Scheduled patient for 6 month follow-up. Called and spoke with the patients daughter, she is aware.

## 2024-06-15 ENCOUNTER — Other Ambulatory Visit: Payer: Self-pay | Admitting: Internal Medicine

## 2024-06-16 ENCOUNTER — Encounter: Payer: Self-pay | Admitting: Physical Therapy

## 2024-06-16 ENCOUNTER — Encounter: Admitting: Physical Therapy

## 2024-06-16 DIAGNOSIS — M6281 Muscle weakness (generalized): Secondary | ICD-10-CM

## 2024-06-16 DIAGNOSIS — R269 Unspecified abnormalities of gait and mobility: Secondary | ICD-10-CM

## 2024-06-16 DIAGNOSIS — C3411 Malignant neoplasm of upper lobe, right bronchus or lung: Secondary | ICD-10-CM | POA: Diagnosis not present

## 2024-06-16 NOTE — Therapy (Signed)
 OUTPATIENT PHYSICAL THERAPY FEMALE PELVIC EVALUATION   Patient Name: Hannah [name] MRN: [id number] DOB:1951/04/30, 73 y.o., female Today's Date: 06/16/2024  END OF SESSION:  PT End of Session - 06/16/24 1038     Visit Number 3    Date for Recertification  06/28/24    Authorization Type Health team MCR no auth req    Authorization - Number of Visits 3    PT Start Time 1030    PT Stop Time 1120    PT Time Calculation (min) 50 min    Activity Tolerance Patient tolerated treatment well;Patient limited by pain    Behavior During Therapy WFL for tasks assessed/performed          Past Medical History:  Diagnosis Date   Allergy    Anemia    Anxiety    Arthritis    Carpal tunnel syndrome    Constipation    senna C stool softeners help    Depression    Diverticulosis    Dyslipidemia    External hemorrhoids    GERD (gastroesophageal reflux disease)    Heart murmur    mild-moderate AR   Hiatal hernia    Hyperlipidemia    on meds    Hypertension    Internal hemorrhoids    lung ca with brain mets 07/2020   Osteoarthritis    Pre-diabetes    PVD (peripheral vascular disease)    moderate carotid disease   RBBB    Smoker    Vocal cord polyps    Past Surgical History:  Procedure Laterality Date   ABDOMINAL HYSTERECTOMY  1994   APPLICATION OF CRANIAL NAVIGATION N/A 08/24/2020   Procedure: APPLICATION OF CRANIAL NAVIGATION;  Surgeon: Cheryle Debby LABOR, MD;  Location: [facility] OR;  Service: Neurosurgery;  Laterality: N/A;   COLONOSCOPY     COLONOSCOPY W/ POLYPECTOMY  2009   CRANIOTOMY Left 08/24/2020   Procedure: Left Craniotomy for Tumor Resection with Brainlab;  Surgeon: Cheryle Debby LABOR, MD;  Location: [facility] OR;  Service: Neurosurgery;  Laterality: Left;   HEMORRHOID SURGERY     INTERCOSTAL NERVE BLOCK Right 09/24/2020   Procedure: INTERCOSTAL NERVE BLOCK;  Surgeon: Kerrin Elspeth BROCKS, MD;  Location: [facility] OR;  Service: Thoracic;  Laterality: Right;   IR IMAGING GUIDED PORT  INSERTION  05/15/2022   JOINT REPLACEMENT     LYMPH NODE DISSECTION Right 09/24/2020   Procedure: LYMPH NODE DISSECTION;  Surgeon: Kerrin Elspeth BROCKS, MD;  Location: [facility] OR;  Service: Thoracic;  Laterality: Right;   POLYPECTOMY     right total knee arthroplasty     Dr. Johann 06-04-18   SPINE SURGERY  08/16/2009   TOTAL KNEE ARTHROPLASTY Left 02/13/2014   Procedure: TOTAL KNEE ARTHROPLASTY;  Surgeon: Norleen LITTIE Gavel, MD;  Location: [facility] OR;  Service: Orthopedics;  Laterality: Left;   TOTAL KNEE ARTHROPLASTY Right 06/04/2018   Procedure: RIGHT TOTAL KNEE ARTHROPLASTY;  Surgeon: Gavel Norleen, MD;  Location: [facility] ORS;  Service: Orthopedics;  Laterality: Right;  Adductor Block   TUBAL LIGATION     Patient Active Problem List   Diagnosis Date Noted   Excessive cerumen in ear canal, bilateral 04/20/2024   Back pain 02/03/2024   Generalized osteoarthritis of hand 12/01/2023   Paresthesia of left foot 12/01/2023   Port-A-Cath in place 08/14/2022   Achilles tendonitis 06/27/2022   Nasal congestion 03/21/2022   PAD (peripheral artery disease) 03/08/2022   Bilateral shoulder pain 12/11/2021   Fatigue 12/11/2021   Non-small cell lung cancer metastatic  to brain [facility]) 09/18/2021   Chest pain 08/30/2021   Urinary frequency 08/30/2021   Acute cystitis 07/16/2021   Vasogenic brain edema (HCC) 04/22/2021   Seizure (HCC) 04/22/2021   Nonrheumatic aortic valve insufficiency 04/18/2021   Allergic rhinitis 02/21/2021   Encounter for antineoplastic chemotherapy 10/23/2020   Encounter for antineoplastic immunotherapy 10/23/2020   Anemia 10/19/2020   Sleep difficulties 10/19/2020   S/P robot-assisted surgical procedure 09/24/2020   Aortic atherosclerosis 09/12/2020   Status post craniotomy 08/24/2020   Rectal bleeding 08/17/2020   Primary cancer of right upper lobe of lung (HCC) 08/07/2020   Primary malignant neoplasm of lung with metastasis to brain (HCC) 08/06/2020   Memory changes 07/24/2020    Non-recurrent unilateral inguinal hernia without obstruction or gangrene 07/24/2020   Pain of right clavicle 09/12/2019   Nonspecific abnormal electrocardiogram (ECG) (EKG) 10/20/2018   Depression 07/17/2018   Primary osteoarthritis of right knee 06/04/2018   Bilateral carotid artery stenosis 09/24/2017   Aortic valve sclerosis 09/24/2017   Vocal cord polyps 09/07/2017   Type 2 diabetes mellitus with hyperlipidemia (HCC) 03/09/2017   GERD (gastroesophageal reflux disease) 02/27/2016   Anxiety 02/27/2016   S/P total knee replacement 02/13/2014   RBBB 02/08/2014   PVD - bilateral 60-79% carotid strenosis 02/08/2014   Hyperlipidemia associated with type 2 diabetes mellitus (HCC) 11/29/2013   Carpal tunnel syndrome, bilateral 11/23/2013   Murmur- mild -mod AR, mild MR 11/10/2013   Constipation 12/16/2012    PCP: [name], MD   REFERRING PROVIDER: Craig Alan SAUNDERS, PA-C  REFERRING DIAG: [phone] (ICD-10-CM) - Pelvic floor dysfunction in female   THERAPY DIAG:  Muscle weakness (generalized) - Plan: PT plan of care cert/re-cert  Abnormal gait - Plan: PT plan of care cert/re-cert  Rationale for Evaluation and Treatment: Rehabilitation  ONSET DATE: always constipated but worse since 2024  SUBJECTIVE:                                                                                                                                                                                           SUBJECTIVE STATEMENT: Therapy helped. I have done the abdominal massage and was able to expel gas. Patient is taking laxative every other day instead of daily.  Patient had an impaction 2 years ago and now stays on a regimen for fiber. Patient will take her laxative at 4 AM so she can get out of the house.   Eval: Patient reports that she is constipated, has been taking Miralax , Linzess , Milk of magnesia. Linzess  does not work though [employer] a capful of Miralax  in the morning and a capful in the  evening  and fiber.  Daughter reports that there is always something that is not emptied. Feels like LLQ and RLQ always feels like there is something there. It hurts.  Pain moves around. Her back hurts as well with walking.   Fluid intake: drinks a lot water  and juice  FUNCTIONAL LIMITATIONS: walks but has constant pain in her thighs and back  PERTINENT HISTORY:  Medications for current condition: laxatives and fiber Surgeries:  Lung and brain cancer treatment in 07/2020, 2023 Sexual abuse: No  PAIN:  Are you having pain? Yes NPRS scale: 8/10 06/16/24: pain level 8/10 Pain location: low back, radiates to her thighs, hard to walk - at least 6-8 months  Pain type: throbbing Pain description: constant  06/16/24: pain is intermittent now  Aggravating factors: walking- back pain, LLQ when she is constipated Relieving factors: back medicine- Ibuprofen  PRECAUTIONS: None  RED FLAGS: None   WEIGHT BEARING RESTRICTIONS: No  FALLS:  Has patient fallen in last 6 months? No  OCCUPATION: retired  ACTIVITY LEVEL : walks in the house- and to appointments, lives with her husband  PLOF: Independent  PATIENT GOALS: patient does not know,    BOWEL MOVEMENT: Pain with bowel movement: Yes Type of bowel movement:Type (Bristol Stool Scale) 1-7, Frequency every day, Strain yes, and Splinting no Fully empty rectum: Yes: sometimes with more laxative 06/09/24: Patient reports her stool that is formed will go to the left.  Leakage: No       06/09/24: patient reports she will leak stool when she takes a laxative as she gets the urge.                                                 Pads: Yes: fear of leaking Fiber supplement/laxative Yes   URINATION: Pain with urination: No Fully empty bladder: No                                Stream: Strong, it stops sometimes, then she completes it Urgency: No Frequency:during the day yes                                                          Nocturia: Yes: at least 3    Leakage: none 06/09/24: patient reports she leaks urine primarily at night. She will urinate 4 times at night 06/16/24: leaks urine at night and wakes up 4 times at night.  Pads/briefs: Yes:    INTERCOURSE: not active   PREGNANCY: Vaginal deliveries 3 Tearing No Episiotomy No C-section deliveries 0 Currently pregnant No  PROLAPSE: None   OBJECTIVE:  Note: Objective measures were completed at Evaluation unless otherwise noted.  DIAGNOSTIC FINDINGS:    PATIENT SURVEYS:  PFIQ-7: 61  COGNITION: Overall cognitive status: Within functional limits for tasks assessed     SENSATION: Light touch: Appears intact  LUMBAR SPECIAL TESTS:    FUNCTIONAL TESTS:   Single leg stance: NT   Sit-up test: very difficult, uses arms Squat:difficult Bed mobility: difficult  GAIT: Assistive device utilized: None Comments: slow and wide steps  POSTURE: rounded shoulders, forward head, and decreased lumbar lordosis  LUMBARAROM/PROM:   A/PROM A/PROM  Eval (% available)  Flexion 50  Extension 50  Right lateral flexion 50  Left lateral flexion 50  Right rotation 50  Left rotation 50   (Blank rows = not tested)  LOWER EXTREMITY ROM: tightness present throughout   LOWER EXTREMITY MMT: 4/5 bilateral hips, 4+/5 bilateral knees  PALPATION:  General: upper chest breathing  Pelvic Alignment: even  Abdominal: restrictions throughout abdomen  Diastasis: Yes: 1 finger  Distortion: Yes   Breathing: upper chest Scar tissue: Yes: abdominal and lumbar spinetight and restricted                External Perineal Exam: to be assessed                             Internal Pelvic Floor: to be assessed  Patient confirms identification and approves PT to assess internal pelvic floor and treatment No  PELVIC MMT:   MMT eval  Vaginal   Internal Anal Sphincter   External Anal Sphincter   Puborectalis   Diastasis Recti 1 finger at and above umbilicus  with coning  (Blank rows = not tested)        TONE: to be assessed  PROLAPSE: to be assessed  TODAY'S TREATMENT:     06/16/24:  Manual: Soft tissue mobilization: Manual work to the hip adductors, quadriceps, hamstring to elongate the muscles and relax the pelvic floor Manual work through patients pant to work on the pelvic floor to elongate and assist with contraction Manual work to the left lower quadrant and left lumbar to reduce trigger points Neuromuscular re-education: Pelvic floor contraction training: Therapist hand on the vaginal area through her clothes working on pelvic floor contraction with lift and not bulging down.  Bridges 15 x Supine hip flexion isometrics 10 x each leg to feel the lower abdominal contraction Down training: Butterfly stretch to open up the pelvic floor Knee to chest to open up the pelvic floor    06/09/24 Manual: Soft tissue mobilization: Circular massage tot he abdomen to promote peristalic motion of the intestines to assist with bowel movement Manual work to the diaphragm to assist with diaphragmatic breathing Scar tissue mobilization: Manual work to the lower abdominal scar Myofascial release: Facial release around the abdomen working on restrictions, along the area of the bladder and colon, along the area of the mesenteric root Neuromuscular re-education: Down training: Diaphragmatic breathing in supine to assist with relaxation of the pelvic floor. She needs tactile cues  to not lift the upper rib cage and practice pushing air into the lower abdomen Self-care: Educated patient on the pelvic floor, how the muscles work with bowel continence, how they relax to push the stool out, how the area of the rectum does stool sampling Educated patient on abdominal massage to assist with bowel movement  DATE: 03/29/2024   EVAL  Examination completed, findings reviewed, pt educated on POC, HEP, and female pelvic floor anatomy, reasoning with pelvic floor assessment internally. Pt motivated to participate in PT and agreeable to attempt recommendations.     PATIENT EDUCATION:  06/16/24 Education details:  Access Code: [email or web address] Person educated: Patient Education method: Explanation, Demonstration, Actor cues, Verbal cues, and Handouts Education comprehension: verbalized understanding, returned demonstration, verbal cues required, tactile cues required, and needs further education  HOME EXERCISE PROGRAM: 06/16/24 Access Code: [email or web address] URL: [email or web address] Date: 06/16/2024 Prepared by: Channing Pereyra  Exercises - Supine Diaphragmatic Breathing  - 3 x daily - 7 x weekly - 1 sets - 10 reps - Supine Pelvic Floor Contraction  - 1 x daily - 7 x weekly - 1 sets - 10 reps - 5 sec hold - Supine Bridge  - 1 x daily - 7 x weekly - 1 sets - 10 reps - Supine Single Knee to Chest  - 1 x daily - 7 x weekly - 1 sets - 2 reps - 30 sec hold - Supine Butterfly Groin Stretch  - 1 x daily - 7 x weekly - 1 sets - 2 reps - 30 sec hold  Patient Education - Abdominal Massage for Constipation    ASSESSMENT:  CLINICAL IMPRESSION: Patient is a 73 y.o. F who was seen today for physical therapy  and treatment for chronic constipation.  Patient is taking laxative every other day instead of daily. She is able to contract the pelvic floor but needs cues to not bulge the pelvic floor. She is learning to contract the lower abdominals. She still has urinary leakage and will have to urinate 4 times in the middle of the night. She continues to strain to have a bowel movement. Patient is not comfortable with therapist performing an internal assessment so therapist does the pelvic floor work externally.  She will benefit from PT to address deficits.   OBJECTIVE IMPAIRMENTS: Abnormal gait, decreased balance, decreased  coordination, decreased endurance, decreased knowledge of condition, decreased mobility, difficulty walking, decreased ROM, decreased strength, hypomobility, increased fascial restrictions, increased muscle spasms, impaired sensation, impaired tone, and pain.   ACTIVITY LIMITATIONS: carrying, lifting, transfers, bed mobility, continence, toileting, and locomotion level  PARTICIPATION LIMITATIONS: community activity and church  PERSONAL FACTORS: Age, Behavior pattern, Time since onset of injury/illness/exacerbation, and Transportation are also affecting patient's functional outcome.   REHAB POTENTIAL: Good  CLINICAL DECISION MAKING: Evolving/moderate complexity  EVALUATION COMPLEXITY: Moderate   GOALS: Goals reviewed with patient? Yes  SHORT TERM GOALS: Target date: 04/26/2024    Pt will be independent with HEP.   Baseline: Goal status: Met 06/16/24  2.  Pt will be independent with use of squatty potty, relaxed toileting mechanics, and improved bowel movement techniques in order to increase ease of bowel movements and complete evacuation.   Baseline:  Goal status: ongoing 06/16/24  3.   Pt will demonstrate appropriate lateral rib cage excursion with inhale to ensure better abdominal pressure management and pelvic floor/abdominal muscle relaxation.   Baseline:  Goal status: ongoing 06/16/24  4.  Patient will be educated on abdominal massage Baseline:  Goal status: Met 06/09/24  5.  Pt will have reduced low back pain to max 1/10 in order to be able to do functional activities such as bending, lifting, twisting and walking as needed to be able to take care of her family and participate in church and community activities Baseline:  Goal status: ongoing 06/16/24   LONG  TERM GOALS: Target date: 09/13/24    Pt will demonstrate 20 point improvement in PFIQ-7 score in order to show functional improvement in bowel symptoms.   Baseline:  Goal status: INITIAL  2.   Pt will be I and  consistent with her HEP and demonstrate all exercises correctly  Baseline:  Goal status: INITIAL  3.  Pt will be able to have a bowel movement within 2-3 minutes so she is not straining and adding pressure on the pelvic floor due to the ability to lengthen the tissue and generate  pressure to push the stool out  Baseline:  Goal status: INITIAL  4.  Patient will report bristol stool scale 3-4  Baseline:  Goal status: INITIAL  5.  Patient will be I with PT healthy Bowel PT recommendations Baseline:  Goal status: INITIAL  6.  Patient will report regular bowel movements - at least 5 times/ week Baseline:  Goal status: INITIAL  PLAN:  PT FREQUENCY: 1-2x/week  PT DURATION: 12 weeks  PLANNED INTERVENTIONS: 97110-Therapeutic exercises, 97530- Therapeutic activity, 97112- Neuromuscular re-education, 97535- Self Care, 02859- Manual therapy, [phone]- Electrical stimulation (manual), [phone]- Ionotophoresis 4mg /ml Dexamethasone , 79439 (1-2 muscles), 20561 (3+ muscles)- Dry Needling, Patient/Family education, Balance training, Taping, Joint mobilization, Joint manipulation, Spinal manipulation, Spinal mobilization, Manual lymph drainage, Scar mobilization, Cryotherapy, Moist heat, and Biofeedback  PLAN FOR NEXT SESSION:  core coordination exercises, diaphragmatic breathing, opening of the rib cage, how to have a bowel movement without straining,   Channing Pereyra, PT 06/16/2024 11:32 AM

## 2024-06-29 ENCOUNTER — Other Ambulatory Visit: Payer: Self-pay | Admitting: Internal Medicine

## 2024-06-29 ENCOUNTER — Other Ambulatory Visit: Payer: Self-pay | Admitting: *Deleted

## 2024-06-29 DIAGNOSIS — C3490 Malignant neoplasm of unspecified part of unspecified bronchus or lung: Secondary | ICD-10-CM

## 2024-06-29 DIAGNOSIS — D496 Neoplasm of unspecified behavior of brain: Secondary | ICD-10-CM

## 2024-06-29 NOTE — Telephone Encounter (Signed)
 Ms. Sammie (pt's daughter) called and requested refill of Vimpat  and Keppra . She said pt has a small amount of each med, but not sure if it will last through weekend.

## 2024-07-01 ENCOUNTER — Encounter: Payer: Self-pay | Admitting: Internal Medicine

## 2024-07-01 MED ORDER — LACOSAMIDE 100 MG PO TABS: 1.0000 | ORAL_TABLET | Freq: Two times a day (BID) | ORAL | 0 refills | Status: AC

## 2024-07-07 ENCOUNTER — Ambulatory Visit: Payer: Self-pay

## 2024-07-07 ENCOUNTER — Telehealth: Admitting: Nurse Practitioner

## 2024-07-07 DIAGNOSIS — R059 Cough, unspecified: Secondary | ICD-10-CM

## 2024-07-07 MED ORDER — PROMETHAZINE-DM 6.25-15 MG/5ML PO SYRP: 5.0000 mL | ORAL_SOLUTION | Freq: Two times a day (BID) | ORAL | 0 refills | Status: AC | PRN

## 2024-07-07 NOTE — Telephone Encounter (Signed)
 Attempted to contact patient's daughter [name] x 1 to discuss symptoms in patient; LVM to return call, Will attempt  contact at a later time to further discuss concerns.         Copied from CRM [id number]. Topic: Clinical - Medical Advice >> Jul 07, 2024 11:29 AM Alfonso ORN wrote: Reason for CRM: pt daughter called regarding pt who swallowed liquid medicine the wrong way and has been raspy and hoarse no other symptoms . Pt daughter would like to keep pcp updated to see if she should do anything besides virtual visit scheduled . Please advise

## 2024-07-07 NOTE — Telephone Encounter (Signed)
" °  FYI Only or Action Required?: Action required by provider: request for appointment.  Patient was last seen in primary care on 04/20/2024 by [name], MD.  Called Nurse Triage reporting swallowed wrong.  Symptoms began yesterday.  Interventions attempted: Rest, hydration, or home remedies.  Symptoms are: stable. Per daughter, pt. Swallowed wrong yesterday, choked. Has had raspy voice since then. Eating and drinking well now. No difficulty swallowing or SOB.  Triage Disposition: See PCP When Office is Open (Within 3 Days)  Patient/caregiver understands and will follow disposition?: Yes     Reason for Disposition  [1] Sore throat is the only symptom AND [2] present > 48 hours  Answer Assessment - Initial Assessment Questions 1. ONSET: When did the throat start hurting? (Hours or days ago)      yesterday 2. SEVERITY: How bad is the sore throat? (Scale 1-10; mild, moderate or severe)     mild 3. STREP EXPOSURE: Has there been any exposure to strep within the past week? If Yes, ask: What type of contact occurred?      no 4.  VIRAL SYMPTOMS: Are there any symptoms of a cold, such as a runny nose, cough, hoarse voice or red eyes?      N/a 5. FEVER: Do you have a fever? If Yes, ask: What is your temperature, how was it measured, and when did it start?     no 6. PUS ON THE TONSILS: Is there pus on the tonsils in the back of your throat?     no 7. OTHER SYMPTOMS: Do you have any other symptoms? (e.g., difficulty breathing, headache, rash)     no 8. PREGNANCY: Is there any chance you are pregnant? When was your last menstrual period?     no  Protocols used: Sore Throat-A-AH  "

## 2024-07-07 NOTE — Progress Notes (Signed)
" ° °  Established Patient Office Visit  An audio/visual tele-health visit was completed today for this patient. I connected with  Hannah [name] on 07/07/2024 utilizing audio/visual technology and verified that I am speaking with the correct person using two identifiers. The patient was located at their home, and I was located at the office of [facility] at [facility] during the encounter. I discussed the limitations of evaluation and management by telemedicine. The patient expressed understanding and agreed to proceed.      Subjective   Patient ID: Hannah [name], female    DOB: 29-Apr-1951  Age: 74 y.o. MRN: [id number]  Chief Complaint  Patient presents with   Cough    Discussed the use of AI scribe software for clinical note transcription with the patient, who gave verbal consent to proceed.  History of Present Illness Hannah [name] is a 74 year old female who presents with symptoms after swallowing liquid medication the wrong way.  Cough - Onset yesterday after swallowing liquid medication incorrectly - Persistent and bothersome - Worse in the evening - No associated shortness of breath - No chest pain - No fever or chills - Symptoms improving since yesterday        Review of Systems  Respiratory:  Negative for shortness of breath.   Cardiovascular:  Negative for chest pain.      Objective:     There were no vitals taken for this visit.   Physical Exam Comprehensive physical exam not completed today as office visit was conducted remotely.  Patient appears well on video and has no evidence of respiratory distress.  Patient was alert and oriented, and appeared to have appropriate judgment.   No results found for any visits on 07/07/24.    The 10-year ASCVD risk score (Arnett DK, et al., 2019) is: 52.9%    Assessment & Plan:   Problem List Items Addressed This Visit   None Visit Diagnoses       Cough, unspecified type    -  Primary   Relevant  Medications   promethazine -dextromethorphan (PROMETHAZINE -DM) 6.25-15 MG/5ML syrup      Assessment and Plan Assessment & Plan Acute cough Likely due to bronchial irritation from liquid medication aspiration. Symptoms improved, low risk of aspiration pneumonia. - Prescribed dextromethorphan and promethazine  for nighttime cough. - Advised against combining with evening Xanax  due to sedation risk. - Instructed to monitor for aspiration pneumonia symptoms: chills, fever, productive cough. - Recommended honey and cough drops for relief.   Return if symptoms worsen or fail to improve.    Lauraine FORBES Pereyra, NP  "

## 2024-07-12 ENCOUNTER — Other Ambulatory Visit: Payer: Self-pay | Admitting: Radiation Therapy

## 2024-07-13 ENCOUNTER — Encounter: Payer: Self-pay | Admitting: Internal Medicine

## 2024-07-13 ENCOUNTER — Other Ambulatory Visit: Payer: Self-pay | Admitting: Internal Medicine

## 2024-07-14 ENCOUNTER — Other Ambulatory Visit: Payer: Self-pay

## 2024-07-14 ENCOUNTER — Inpatient Hospital Stay: Attending: Internal Medicine

## 2024-07-14 MED ORDER — PRAVASTATIN SODIUM 80 MG PO TABS: 80.0000 mg | ORAL_TABLET | Freq: Every evening | ORAL | 1 refills | Status: AC

## 2024-07-21 ENCOUNTER — Encounter: Payer: Self-pay | Admitting: Physical Therapy

## 2024-07-21 ENCOUNTER — Encounter: Payer: Self-pay | Attending: Physician Assistant | Admitting: Physical Therapy

## 2024-07-21 DIAGNOSIS — M6289 Other specified disorders of muscle: Secondary | ICD-10-CM | POA: Diagnosis not present

## 2024-07-21 DIAGNOSIS — M6281 Muscle weakness (generalized): Secondary | ICD-10-CM | POA: Insufficient documentation

## 2024-07-21 DIAGNOSIS — R269 Unspecified abnormalities of gait and mobility: Secondary | ICD-10-CM | POA: Diagnosis not present

## 2024-07-21 NOTE — Therapy (Signed)
 " OUTPATIENT PHYSICAL THERAPY FEMALE PELVIC EVALUATION   Patient Name: Hannah [name] MRN: [id number] DOB:11/14/1950, 74 y.o., female Today's Date: 07/21/2024  END OF SESSION:  PT End of Session - 07/21/24 0837     Visit Number 4    Date for Recertification  09/20/24    Authorization Type Health team MCR no auth req    Authorization - Number of Visits 4    Progress Note Due on Visit 10    PT Start Time 0830    PT Stop Time 0920    PT Time Calculation (min) 50 min    Activity Tolerance Patient tolerated treatment well    Behavior During Therapy WFL for tasks assessed/performed          Past Medical History:  Diagnosis Date   Allergy    Anemia    Anxiety    Arthritis    Carpal tunnel syndrome    Constipation    senna C stool softeners help    Depression    Diverticulosis    Dyslipidemia    External hemorrhoids    GERD (gastroesophageal reflux disease)    Heart murmur    mild-moderate AR   Hiatal hernia    Hyperlipidemia    on meds    Hypertension    Internal hemorrhoids    lung ca with brain mets 07/2020   Osteoarthritis    Pre-diabetes    PVD (peripheral vascular disease)    moderate carotid disease   RBBB    Smoker    Vocal cord polyps    Past Surgical History:  Procedure Laterality Date   ABDOMINAL HYSTERECTOMY  1994   APPLICATION OF CRANIAL NAVIGATION N/A 08/24/2020   Procedure: APPLICATION OF CRANIAL NAVIGATION;  Surgeon: Cheryle Debby LABOR, MD;  Location: [facility] OR;  Service: Neurosurgery;  Laterality: N/A;   COLONOSCOPY     COLONOSCOPY W/ POLYPECTOMY  2009   CRANIOTOMY Left 08/24/2020   Procedure: Left Craniotomy for Tumor Resection with Brainlab;  Surgeon: Cheryle Debby LABOR, MD;  Location: [facility] OR;  Service: Neurosurgery;  Laterality: Left;   HEMORRHOID SURGERY     INTERCOSTAL NERVE BLOCK Right 09/24/2020   Procedure: INTERCOSTAL NERVE BLOCK;  Surgeon: Kerrin Elspeth BROCKS, MD;  Location: [facility] OR;  Service: Thoracic;  Laterality: Right;   IR IMAGING  GUIDED PORT INSERTION  05/15/2022   JOINT REPLACEMENT     LYMPH NODE DISSECTION Right 09/24/2020   Procedure: LYMPH NODE DISSECTION;  Surgeon: Kerrin Elspeth BROCKS, MD;  Location: [facility] OR;  Service: Thoracic;  Laterality: Right;   POLYPECTOMY     right total knee arthroplasty     Dr. Johann 06-04-18   SPINE SURGERY  08/16/2009   TOTAL KNEE ARTHROPLASTY Left 02/13/2014   Procedure: TOTAL KNEE ARTHROPLASTY;  Surgeon: Norleen LITTIE Gavel, MD;  Location: [facility] OR;  Service: Orthopedics;  Laterality: Left;   TOTAL KNEE ARTHROPLASTY Right 06/04/2018   Procedure: RIGHT TOTAL KNEE ARTHROPLASTY;  Surgeon: Gavel Norleen, MD;  Location: [facility] ORS;  Service: Orthopedics;  Laterality: Right;  Adductor Block   TUBAL LIGATION     Patient Active Problem List   Diagnosis Date Noted   Excessive cerumen in ear canal, bilateral 04/20/2024   Back pain 02/03/2024   Generalized osteoarthritis of hand 12/01/2023   Paresthesia of left foot 12/01/2023   Port-A-Cath in place 08/14/2022   Achilles tendonitis 06/27/2022   Nasal congestion 03/21/2022   PAD (peripheral artery disease) 03/08/2022   Bilateral shoulder pain 12/11/2021   Fatigue 12/11/2021  Non-small cell lung cancer metastatic to brain (HCC) 09/18/2021   Chest pain 08/30/2021   Urinary frequency 08/30/2021   Acute cystitis 07/16/2021   Vasogenic brain edema (HCC) 04/22/2021   Seizure (HCC) 04/22/2021   Nonrheumatic aortic valve insufficiency 04/18/2021   Allergic rhinitis 02/21/2021   Encounter for antineoplastic chemotherapy 10/23/2020   Encounter for antineoplastic immunotherapy 10/23/2020   Anemia 10/19/2020   Sleep difficulties 10/19/2020   S/P robot-assisted surgical procedure 09/24/2020   Aortic atherosclerosis 09/12/2020   Status post craniotomy 08/24/2020   Rectal bleeding 08/17/2020   Primary cancer of right upper lobe of lung (HCC) 08/07/2020   Primary malignant neoplasm of lung with metastasis to brain (HCC) 08/06/2020   Memory changes  07/24/2020   Non-recurrent unilateral inguinal hernia without obstruction or gangrene 07/24/2020   Pain of right clavicle 09/12/2019   Nonspecific abnormal electrocardiogram (ECG) (EKG) 10/20/2018   Depression 07/17/2018   Primary osteoarthritis of right knee 06/04/2018   Bilateral carotid artery stenosis 09/24/2017   Aortic valve sclerosis 09/24/2017   Vocal cord polyps 09/07/2017   Type 2 diabetes mellitus with hyperlipidemia (HCC) 03/09/2017   GERD (gastroesophageal reflux disease) 02/27/2016   Anxiety 02/27/2016   S/P total knee replacement 02/13/2014   RBBB 02/08/2014   PVD - bilateral 60-79% carotid strenosis 02/08/2014   Hyperlipidemia associated with type 2 diabetes mellitus (HCC) 11/29/2013   Carpal tunnel syndrome, bilateral 11/23/2013   Murmur- mild -mod AR, mild MR 11/10/2013   Constipation 12/16/2012    PCP: [name], MD   REFERRING PROVIDER: Craig Alan SAUNDERS, PA-C  REFERRING DIAG: [phone] (ICD-10-CM) - Pelvic floor dysfunction in female   THERAPY DIAG:  Muscle weakness (generalized) - Plan: PT plan of care cert/re-cert  Abnormal gait - Plan: PT plan of care cert/re-cert  Rationale for Evaluation and Treatment: Rehabilitation  ONSET DATE: always constipated but worse since 2024  SUBJECTIVE:                                                                                                                                                                                           SUBJECTIVE STATEMENT: I am doing the exercise program. Patient takes her laxative daily unless she has an appoinmtne.    Eval: Patient reports that she is constipated, has been taking Miralax , Linzess , Milk of magnesia. Linzess  does not work though [employer] a capful of Miralax  in the morning and a capful in the evening and fiber.  Daughter reports that there is always something that is not emptied. Feels like LLQ and RLQ always feels like there is something there. It hurts.  Pain moves  around. Her back  hurts as well with walking.   Fluid intake: drinks a lot water  and juice  FUNCTIONAL LIMITATIONS: walks but has constant pain in her thighs and back  PERTINENT HISTORY:  Medications for current condition: laxatives and fiber Surgeries:  Lung and brain cancer treatment in 07/2020, 2023 Sexual abuse: No  PAIN:  Are you having pain? Yes NPRS scale: 8/10 06/16/24: pain level 8/10 Pain location: low back, radiates to her thighs, hard to walk - at least 6-8 months  Pain type: throbbing Pain description: constant  06/16/24: pain is intermittent now  Aggravating factors: walking- back pain, LLQ when she is constipated Relieving factors: back medicine- Ibuprofen  PRECAUTIONS: None  RED FLAGS: None   WEIGHT BEARING RESTRICTIONS: No  FALLS:  Has patient fallen in last 6 months? No  OCCUPATION: retired  ACTIVITY LEVEL : walks in the house- and to appointments, lives with her husband  PLOF: Independent  PATIENT GOALS: patient does not know,    BOWEL MOVEMENT: Pain with bowel movement: Yes Type of bowel movement:Type (Bristol Stool Scale) 1-7, Frequency every day, Strain yes, and Splinting no Fully empty rectum: Yes: sometimes with more laxative 06/09/24: Patient reports her stool that is formed will go to the left.  Leakage: No       06/09/24: patient reports she will leak stool when she takes a laxative as she gets the urge.                                                 Pads: Yes: fear of leaking Fiber supplement/laxative Yes   URINATION: Pain with urination: No Fully empty bladder: No                                Stream: Strong, it stops sometimes, then she completes it Urgency: No Frequency:during the day yes                                                         Nocturia: Yes: at least 3    Leakage: none 06/09/24: patient reports she leaks urine primarily at night. She will urinate 4 times at night 06/16/24: leaks urine at night and wakes up 4  times at night.  Pads/briefs: Yes:    INTERCOURSE: not active   PREGNANCY: Vaginal deliveries 3 Tearing No Episiotomy No C-section deliveries 0 Currently pregnant No  PROLAPSE: None   OBJECTIVE:  Note: Objective measures were completed at Evaluation unless otherwise noted.  DIAGNOSTIC FINDINGS:    PATIENT SURVEYS:  PFIQ-7: 64  COGNITION: Overall cognitive status: Within functional limits for tasks assessed     SENSATION: Light touch: Appears intact  LUMBAR SPECIAL TESTS:    FUNCTIONAL TESTS:   Single leg stance: NT   Sit-up test: very difficult, uses arms Squat:difficult Bed mobility: difficult  GAIT: Assistive device utilized: None Comments: slow and wide steps  POSTURE: rounded shoulders, forward head, and decreased lumbar lordosis   LUMBARAROM/PROM:   A/PROM A/PROM  Eval (% available)  Flexion 50  Extension 50  Right lateral flexion 50  Left lateral flexion 50  Right rotation 50  Left rotation  50   (Blank rows = not tested)  LOWER EXTREMITY ROM: tightness present throughout   LOWER EXTREMITY MMT: 4/5 bilateral hips, 4+/5 bilateral knees  PALPATION:  General: upper chest breathing  Pelvic Alignment: even  Abdominal: restrictions throughout abdomen  Diastasis: Yes: 1 finger  Distortion: Yes   Breathing: upper chest Scar tissue: Yes: abdominal and lumbar spinetight and restricted                External Perineal Exam: to be assessed                             Internal Pelvic Floor: to be assessed  Patient confirms identification and approves PT to assess internal pelvic floor and treatment No  PELVIC MMT:   MMT eval 07/21/24  Vaginal    Internal Anal Sphincter  3/5  External Anal Sphincter  3/5  Puborectalis  3/5  Diastasis Recti 1 finger at and above umbilicus with coning   (Blank rows = not tested)        TONE: to be assessed  PROLAPSE: to be assessed  TODAY'S TREATMENT:     07/21/24 Manual: Soft tissue  mobilization: Abdominal circular massage to improve peristalic motion of the intestines Scar tissue mobilization: Manual work to the scar on the lower abdomen to improve tissue mobility Myofascial release: Fascial release of the lower abdomen, along the mesenteric root, along the right lower quadrant, and along the left upper quadrant Internal pelvic floor techniques: No emotional/communication barriers or cognitive limitation. Patient is motivated to learn. Patient understands and agrees with treatment goals and plan. PT explains patient will be examined in standing, sitting, and lying down to see how their muscles and joints work. When they are ready, they will be asked to remove their underwear so PT can examine their perineum. The patient is also given the option of providing their own chaperone as one is not provided in our facility. The patient also has the right and is explained the right to defer or refuse any part of the evaluation or treatment including the internal exam. With the patient's consent, PT will use one gloved finger to gently assess the muscles of the pelvic floor, seeing how well it contracts and relaxes and if there is muscle symmetry. After, the patient will get dressed and PT and patient will discuss exam findings and plan of care. PT and patient discuss plan of care, schedule, attendance policy and HEP activities.  Therapist gloved finger in the rectum working on the anococcygeal ligament, puborectalis, external anal sphincter, and obturator internist.  Neuromuscular re-education: Pelvic floor contraction training: Therapist gloved finger in the rectum working on pelvic floor contraction and diaphragmatic breathing to lengthen the pelvic floor Therapeutic activities: Functional strengthening activities: Educated patient on how to sit on the commode, how to breath into the abdomen and push the stool out then knees above hips.  Self-care: Educated patient on the large  intestines and the curve on the lower left side that could cause pain when stool going through.  Educated patient on how to perform manual work to the lower abdominal area for the left lower quadrant pain    06/16/24:  Manual: Soft tissue mobilization: Manual work to the hip adductors, quadriceps, hamstring to elongate the muscles and relax the pelvic floor Manual work through patients pant to work on the pelvic floor to elongate and assist with contraction Manual work to the left lower quadrant and left  lumbar to reduce trigger points Neuromuscular re-education: Pelvic floor contraction training: Therapist hand on the vaginal area through her clothes working on pelvic floor contraction with lift and not bulging down.  Bridges 15 x Supine hip flexion isometrics 10 x each leg to feel the lower abdominal contraction Down training: Butterfly stretch to open up the pelvic floor Knee to chest to open up the pelvic floor    06/09/24 Manual: Soft tissue mobilization: Circular massage tot he abdomen to promote peristalic motion of the intestines to assist with bowel movement Manual work to the diaphragm to assist with diaphragmatic breathing Scar tissue mobilization: Manual work to the lower abdominal scar Myofascial release: Facial release around the abdomen working on restrictions, along the area of the bladder and colon, along the area of the mesenteric root Neuromuscular re-education: Down training: Diaphragmatic breathing in supine to assist with relaxation of the pelvic floor. She needs tactile cues  to not lift the upper rib cage and practice pushing air into the lower abdomen Self-care: Educated patient on the pelvic floor, how the muscles work with bowel continence, how they relax to push the stool out, how the area of the rectum does stool sampling Educated patient on abdominal massage to assist with bowel movement                                                                                                                               DATE: 03/29/2024  EVAL  Examination completed, findings reviewed, pt educated on POC, HEP, and female pelvic floor anatomy, reasoning with pelvic floor assessment internally. Pt motivated to participate in PT and agreeable to attempt recommendations.     PATIENT EDUCATION:  06/16/24 Education details:  Access Code: [email or web address] Person educated: Patient Education method: Explanation, Demonstration, Actor cues, Verbal cues, and Handouts Education comprehension: verbalized understanding, returned demonstration, verbal cues required, tactile cues required, and needs further education  HOME EXERCISE PROGRAM: 06/16/24 Access Code: [email or web address] URL: [email or web address] Date: 06/16/2024 Prepared by: Channing Pereyra  Exercises - Supine Diaphragmatic Breathing  - 3 x daily - 7 x weekly - 1 sets - 10 reps - Supine Pelvic Floor Contraction  - 1 x daily - 7 x weekly - 1 sets - 10 reps - 5 sec hold - Supine Bridge  - 1 x daily - 7 x weekly - 1 sets - 10 reps - Supine Single Knee to Chest  - 1 x daily - 7 x weekly - 1 sets - 2 reps - 30 sec hold - Supine Butterfly Groin Stretch  - 1 x daily - 7 x weekly - 1 sets - 2 reps - 30 sec hold  Patient Education - Abdominal Massage for Constipation    ASSESSMENT:  CLINICAL IMPRESSION: Patient is a 74 y.o. F who was seen today for physical therapy  and treatment for chronic constipation.  Pelvic floor strength is 3/5 after manual work. She had a tight external anal sphincter, anococcygeal ligament,  and puborectalis. She initially would contract the anus with pushing the therapist finger out but after verbal cues was able to push the therapist finger out with correct breath. She understands ways to work on the left lower quadrant pain. She will benefit from PT to address deficits.   OBJECTIVE IMPAIRMENTS: Abnormal gait, decreased balance, decreased coordination, decreased endurance,  decreased knowledge of condition, decreased mobility, difficulty walking, decreased ROM, decreased strength, hypomobility, increased fascial restrictions, increased muscle spasms, impaired sensation, impaired tone, and pain.   ACTIVITY LIMITATIONS: carrying, lifting, transfers, bed mobility, continence, toileting, and locomotion level  PARTICIPATION LIMITATIONS: community activity and church  PERSONAL FACTORS: Age, Behavior pattern, Time since onset of injury/illness/exacerbation, and Transportation are also affecting patient's functional outcome.   REHAB POTENTIAL: Good  CLINICAL DECISION MAKING: Evolving/moderate complexity  EVALUATION COMPLEXITY: Moderate   GOALS: Goals reviewed with patient? Yes  SHORT TERM GOALS: Target date: 04/26/2024    Pt will be independent with HEP.   Baseline: Goal status: Met 06/16/24  2.  Pt will be independent with use of squatty potty, relaxed toileting mechanics, and improved bowel movement techniques in order to increase ease of bowel movements and complete evacuation.   Baseline:  Goal status: ongoing 07/21/24  3.   Pt will demonstrate appropriate lateral rib cage excursion with inhale to ensure better abdominal pressure management and pelvic floor/abdominal muscle relaxation.   Baseline:  Goal status: Met 07/21/24  4.  Patient will be educated on abdominal massage Baseline:  Goal status: Met 06/09/24  5.  Pt will have reduced low back pain to max 1/10 in order to be able to do functional activities such as bending, lifting, twisting and walking as needed to be able to take care of her family and participate in church and community activities Baseline:  Goal status: ongoing 07/21/24   LONG TERM GOALS: Target date: 09/20/24    Pt will demonstrate 20 point improvement in PFIQ-7 score in order to show functional improvement in bowel symptoms.   Baseline:  Goal status: ongoing 07/21/24  2.   Pt will be I and consistent with her HEP and  demonstrate all exercises correctly  Baseline:  Goal status: ongoing 07/21/24  3.  Pt will be able to have a bowel movement within 2-3 minutes so she is not straining and adding pressure on the pelvic floor due to the ability to lengthen the tissue and generate  pressure to push the stool out  Baseline:  Goal status: ongoing 07/21/24  4.  Patient will report bristol stool scale 3-4  Baseline:  Goal status: ongoing 07/21/24  5.  Patient will be I with PT healthy Bowel PT recommendations Baseline:  Goal status: ongoing 07/21/24  6.  Patient will report regular bowel movements - at least 5 times/ week Baseline:  Goal status: ongoing 07/21/24  PLAN:  PT FREQUENCY: 1-2x/week  PT DURATION: 12 weeks  PLANNED INTERVENTIONS: 97110-Therapeutic exercises, 97530- Therapeutic activity, 97112- Neuromuscular re-education, 97535- Self Care, [id number]- Manual therapy, [phone]- Electrical stimulation (manual), [phone]- Ionotophoresis 4mg /ml Dexamethasone , 79439 (1-2 muscles), 20561 (3+ muscles)- Dry Needling, Patient/Family education, Balance training, Taping, Joint mobilization, Joint manipulation, Spinal manipulation, Spinal mobilization, Manual lymph drainage, Scar mobilization, Cryotherapy, Moist heat, and Biofeedback  PLAN FOR NEXT SESSION:  core coordination exercises, diaphragmatic breathing,  how to have a bowel movement without straining,   Channing Pereyra, PT 07/21/24 9:32 AM  "

## 2024-07-21 NOTE — Patient Instructions (Signed)
 How To Poop Better:  What are Good Poops? There is no one exact normal, but they should be REGULAR.  This varies from person to person and ranges from up to 3x/day or as little as 3-4/week.  This should stay consistent for you.  They should be formed and ideally one solid mass that doesn't fall apart or dissolve in the water and is brown in color.  Lifestyle Tips:  Fiber: Eat 25-31 grams per day Do not hold it.  If you need to go, GO! Try to go every day around the same time Walk and move more Probiotics for more healthy gut bacteria Water and fluids: half of your healthy body weight in ounces  Toileting Tips:  Posture: knees above hips, back flat, look straight ahead, RELAX Relax all the muscles from your face down to your toes Breathe: slow deep breaths into your belly and pelvic floor is RELAXED Blow: Tighten belly and blow like blowing up a balloon, make "SH" sound, make a vowel sound with a deep voice Do NOT sit more than 10 minutes After you are finished, tighten the muscles to reset pelvic floor back to normal      Eulis Foster, PT [facility] [address], [address] [place], [place] [address] W: [phone] [email or web address]

## 2024-07-28 ENCOUNTER — Other Ambulatory Visit: Payer: Self-pay | Admitting: Internal Medicine

## 2024-07-28 ENCOUNTER — Encounter: Payer: Self-pay | Admitting: Physical Therapy

## 2024-07-28 DIAGNOSIS — M6281 Muscle weakness (generalized): Secondary | ICD-10-CM

## 2024-07-28 DIAGNOSIS — M6289 Other specified disorders of muscle: Secondary | ICD-10-CM | POA: Diagnosis not present

## 2024-07-28 DIAGNOSIS — R269 Unspecified abnormalities of gait and mobility: Secondary | ICD-10-CM

## 2024-07-28 DIAGNOSIS — C3490 Malignant neoplasm of unspecified part of unspecified bronchus or lung: Secondary | ICD-10-CM

## 2024-07-28 NOTE — Therapy (Signed)
 " OUTPATIENT PHYSICAL THERAPY FEMALE PELVIC EVALUATION   Patient Name: Hannah [name] MRN: [id number] DOB:1950-09-25, 74 y.o., female Today's Date: 07/28/2024  END OF SESSION:  PT End of Session - 07/28/24 1501     Visit Number 5    Date for Recertification  09/20/24    Authorization Type Health team MCR no auth req    Authorization - Number of Visits 5    Progress Note Due on Visit 10    PT Start Time 1600    PT Stop Time 1645    PT Time Calculation (min) 45 min    Activity Tolerance Patient tolerated treatment well    Behavior During Therapy WFL for tasks assessed/performed          Past Medical History:  Diagnosis Date   Allergy    Anemia    Anxiety    Arthritis    Carpal tunnel syndrome    Constipation    senna C stool softeners help    Depression    Diverticulosis    Dyslipidemia    External hemorrhoids    GERD (gastroesophageal reflux disease)    Heart murmur    mild-moderate AR   Hiatal hernia    Hyperlipidemia    on meds    Hypertension    Internal hemorrhoids    lung ca with brain mets 07/2020   Osteoarthritis    Pre-diabetes    PVD (peripheral vascular disease)    moderate carotid disease   RBBB    Smoker    Vocal cord polyps    Past Surgical History:  Procedure Laterality Date   ABDOMINAL HYSTERECTOMY  1994   APPLICATION OF CRANIAL NAVIGATION N/A 08/24/2020   Procedure: APPLICATION OF CRANIAL NAVIGATION;  Surgeon: Cheryle Debby LABOR, MD;  Location: [facility] OR;  Service: Neurosurgery;  Laterality: N/A;   COLONOSCOPY     COLONOSCOPY W/ POLYPECTOMY  2009   CRANIOTOMY Left 08/24/2020   Procedure: Left Craniotomy for Tumor Resection with Brainlab;  Surgeon: Cheryle Debby LABOR, MD;  Location: [facility] OR;  Service: Neurosurgery;  Laterality: Left;   HEMORRHOID SURGERY     INTERCOSTAL NERVE BLOCK Right 09/24/2020   Procedure: INTERCOSTAL NERVE BLOCK;  Surgeon: Kerrin Elspeth BROCKS, MD;  Location: [facility] OR;  Service: Thoracic;  Laterality: Right;   IR IMAGING  GUIDED PORT INSERTION  05/15/2022   JOINT REPLACEMENT     LYMPH NODE DISSECTION Right 09/24/2020   Procedure: LYMPH NODE DISSECTION;  Surgeon: Kerrin Elspeth BROCKS, MD;  Location: [facility] OR;  Service: Thoracic;  Laterality: Right;   POLYPECTOMY     right total knee arthroplasty     Dr. Johann 06-04-18   SPINE SURGERY  08/16/2009   TOTAL KNEE ARTHROPLASTY Left 02/13/2014   Procedure: TOTAL KNEE ARTHROPLASTY;  Surgeon: Norleen LITTIE Gavel, MD;  Location: [facility] OR;  Service: Orthopedics;  Laterality: Left;   TOTAL KNEE ARTHROPLASTY Right 06/04/2018   Procedure: RIGHT TOTAL KNEE ARTHROPLASTY;  Surgeon: Gavel Norleen, MD;  Location: [facility] ORS;  Service: Orthopedics;  Laterality: Right;  Adductor Block   TUBAL LIGATION     Patient Active Problem List   Diagnosis Date Noted   Excessive cerumen in ear canal, bilateral 04/20/2024   Back pain 02/03/2024   Generalized osteoarthritis of hand 12/01/2023   Paresthesia of left foot 12/01/2023   Port-A-Cath in place 08/14/2022   Achilles tendonitis 06/27/2022   Nasal congestion 03/21/2022   PAD (peripheral artery disease) 03/08/2022   Bilateral shoulder pain 12/11/2021   Fatigue 12/11/2021  Non-small cell lung cancer metastatic to brain (HCC) 09/18/2021   Chest pain 08/30/2021   Urinary frequency 08/30/2021   Acute cystitis 07/16/2021   Vasogenic brain edema (HCC) 04/22/2021   Seizure (HCC) 04/22/2021   Nonrheumatic aortic valve insufficiency 04/18/2021   Allergic rhinitis 02/21/2021   Encounter for antineoplastic chemotherapy 10/23/2020   Encounter for antineoplastic immunotherapy 10/23/2020   Anemia 10/19/2020   Sleep difficulties 10/19/2020   S/P robot-assisted surgical procedure 09/24/2020   Aortic atherosclerosis 09/12/2020   Status post craniotomy 08/24/2020   Rectal bleeding 08/17/2020   Primary cancer of right upper lobe of lung (HCC) 08/07/2020   Primary malignant neoplasm of lung with metastasis to brain (HCC) 08/06/2020   Memory changes  07/24/2020   Non-recurrent unilateral inguinal hernia without obstruction or gangrene 07/24/2020   Pain of right clavicle 09/12/2019   Nonspecific abnormal electrocardiogram (ECG) (EKG) 10/20/2018   Depression 07/17/2018   Primary osteoarthritis of right knee 06/04/2018   Bilateral carotid artery stenosis 09/24/2017   Aortic valve sclerosis 09/24/2017   Vocal cord polyps 09/07/2017   Type 2 diabetes mellitus with hyperlipidemia (HCC) 03/09/2017   GERD (gastroesophageal reflux disease) 02/27/2016   Anxiety 02/27/2016   S/P total knee replacement 02/13/2014   RBBB 02/08/2014   PVD - bilateral 60-79% carotid strenosis 02/08/2014   Hyperlipidemia associated with type 2 diabetes mellitus (HCC) 11/29/2013   Carpal tunnel syndrome, bilateral 11/23/2013   Murmur- mild -mod AR, mild MR 11/10/2013   Constipation 12/16/2012    PCP: [name], MD   REFERRING PROVIDER: Craig Alan SAUNDERS, PA-C  REFERRING DIAG: [phone] (ICD-10-CM) - Pelvic floor dysfunction in female   THERAPY DIAG:  Muscle weakness (generalized)  Abnormal gait  Rationale for Evaluation and Treatment: Rehabilitation  ONSET DATE: always constipated but worse since 2024  SUBJECTIVE:                                                                                                                                                                                           SUBJECTIVE STATEMENT: I am doing the exercise program. Patient takes her laxative daily unless she has an appoinmtne.    Eval: Patient reports that she is constipated, has been taking Miralax , Linzess , Milk of magnesia. Linzess  does not work though [employer] a capful of Miralax  in the morning and a capful in the evening and fiber.  Daughter reports that there is always something that is not emptied. Feels like LLQ and RLQ always feels like there is something there. It hurts.  Pain moves around. Her back hurts as well with walking.   Fluid intake: drinks a  lot water  and  juice  FUNCTIONAL LIMITATIONS: walks but has constant pain in her thighs and back  PERTINENT HISTORY:  Medications for current condition: laxatives and fiber Surgeries:  Lung and brain cancer treatment in 07/2020, 2023 Sexual abuse: No  PAIN:  Are you having pain? Yes NPRS scale: 8/10 06/16/24: pain level 8/10 Pain location: low back, radiates to her thighs, hard to walk - at least 6-8 months  Pain type: throbbing Pain description: constant  06/16/24: pain is intermittent now  Aggravating factors: walking- back pain, LLQ when she is constipated Relieving factors: back medicine- Ibuprofen  PRECAUTIONS: None  RED FLAGS: None   WEIGHT BEARING RESTRICTIONS: No  FALLS:  Has patient fallen in last 6 months? No  OCCUPATION: retired  ACTIVITY LEVEL : walks in the house- and to appointments, lives with her husband  PLOF: Independent  PATIENT GOALS: patient does not know,    BOWEL MOVEMENT: Pain with bowel movement: Yes Type of bowel movement:Type (Bristol Stool Scale) 1-7, Frequency every day, Strain yes, and Splinting no Fully empty rectum: Yes: sometimes with more laxative 06/09/24: Patient reports her stool that is formed will go to the left.  Leakage: No       06/09/24: patient reports she will leak stool when she takes a laxative as she gets the urge.                                                 Pads: Yes: fear of leaking Fiber supplement/laxative Yes   URINATION: Pain with urination: No Fully empty bladder: No                                Stream: Strong, it stops sometimes, then she completes it Urgency: No Frequency:during the day yes                                                         Nocturia: Yes: at least 3    Leakage: none 06/09/24: patient reports she leaks urine primarily at night. She will urinate 4 times at night 07/28/24: patient leaks urine at night and is not aware of it 06/16/24: leaks urine at night and wakes up 4 times at  night.  Pads/briefs: Yes:    INTERCOURSE: not active   PREGNANCY: Vaginal deliveries 3 Tearing No Episiotomy No C-section deliveries 0 Currently pregnant No  PROLAPSE: None   OBJECTIVE:  Note: Objective measures were completed at Evaluation unless otherwise noted.  DIAGNOSTIC FINDINGS:    PATIENT SURVEYS:  PFIQ-7: 37  COGNITION: Overall cognitive status: Within functional limits for tasks assessed     SENSATION: Light touch: Appears intact  LUMBAR SPECIAL TESTS:    FUNCTIONAL TESTS:   Single leg stance: NT   Sit-up test: very difficult, uses arms Squat:difficult Bed mobility: difficult  GAIT: Assistive device utilized: None Comments: slow and wide steps  POSTURE: rounded shoulders, forward head, and decreased lumbar lordosis   LUMBARAROM/PROM:   A/PROM A/PROM  Eval (% available)  Flexion 50  Extension 50  Right lateral flexion 50  Left lateral flexion 50  Right rotation 50  Left rotation 50   (  Blank rows = not tested)  LOWER EXTREMITY ROM: tightness present throughout   LOWER EXTREMITY MMT: 4/5 bilateral hips, 4+/5 bilateral knees  PALPATION:  General: upper chest breathing  Pelvic Alignment: even  Abdominal: restrictions throughout abdomen  Diastasis: Yes: 1 finger  Distortion: Yes   Breathing: upper chest Scar tissue: Yes: abdominal and lumbar spinetight and restricted                External Perineal Exam: to be assessed                             Internal Pelvic Floor: to be assessed  Patient confirms identification and approves PT to assess internal pelvic floor and treatment No  PELVIC MMT:   MMT eval 07/21/24  Vaginal    Internal Anal Sphincter  3/5  External Anal Sphincter  3/5  Puborectalis  3/5  Diastasis Recti 1 finger at and above umbilicus with coning   (Blank rows = not tested)        TONE: to be assessed  PROLAPSE: to be assessed  TODAY'S TREATMENT:     07/28/24 Neuromuscular re-education: Core  facilitation: Standing hip extension with core engaged 2 x 10 Standing hip abduction with core engaged 2 x 10  Squatting holding onto the counter 15 times with cues to not bring knees inward Form correction: Bilateral shoulder extension with yellow tube 10 # 15 x with verbal cues to squeeze scapulas Standing anti rotation with 10# tube 10 x each side Pelvic floor contraction training: Sitting on ball and contract her rectum with her eyes closed 10 x 1 second 10 x 5 seconds Sitting on the ball and contract the vaginal working on 10 x 1 second 10 x 5 seconds Down training: Diaphragmatic breathing with feet on squatty potty, tactile cues to not raise chest and keep shoulders relaxed Therapeutic activities: Functional strengthening activities: Sitting with knees above hips with breathing into the stomach and breathing out to generate force to push stool out. Needs many verbal cues to breath out a certain way and not move head forward.     07/21/24 Manual: Soft tissue mobilization: Abdominal circular massage to improve peristalic motion of the intestines Scar tissue mobilization: Manual work to the scar on the lower abdomen to improve tissue mobility Myofascial release: Fascial release of the lower abdomen, along the mesenteric root, along the right lower quadrant, and along the left upper quadrant Internal pelvic floor techniques: No emotional/communication barriers or cognitive limitation. Patient is motivated to learn. Patient understands and agrees with treatment goals and plan. PT explains patient will be examined in standing, sitting, and lying down to see how their muscles and joints work. When they are ready, they will be asked to remove their underwear so PT can examine their perineum. The patient is also given the option of providing their own chaperone as one is not provided in our facility. The patient also has the right and is explained the right to defer or refuse any part of the  evaluation or treatment including the internal exam. With the patient's consent, PT will use one gloved finger to gently assess the muscles of the pelvic floor, seeing how well it contracts and relaxes and if there is muscle symmetry. After, the patient will get dressed and PT and patient will discuss exam findings and plan of care. PT and patient discuss plan of care, schedule, attendance policy and HEP activities.  Therapist gloved  finger in the rectum working on the anococcygeal ligament, puborectalis, external anal sphincter, and obturator internist.  Neuromuscular re-education: Pelvic floor contraction training: Therapist gloved finger in the rectum working on pelvic floor contraction and diaphragmatic breathing to lengthen the pelvic floor Therapeutic activities: Functional strengthening activities: Educated patient on how to sit on the commode, how to breath into the abdomen and push the stool out then knees above hips.  Self-care: Educated patient on the large intestines and the curve on the lower left side that could cause pain when stool going through.  Educated patient on how to perform manual work to the lower abdominal area for the left lower quadrant pain    06/16/24:  Manual: Soft tissue mobilization: Manual work to the hip adductors, quadriceps, hamstring to elongate the muscles and relax the pelvic floor Manual work through patients pant to work on the pelvic floor to elongate and assist with contraction Manual work to the left lower quadrant and left lumbar to reduce trigger points Neuromuscular re-education: Pelvic floor contraction training: Therapist hand on the vaginal area through her clothes working on pelvic floor contraction with lift and not bulging down.  Bridges 15 x Supine hip flexion isometrics 10 x each leg to feel the lower abdominal contraction Down training: Butterfly stretch to open up the pelvic floor Knee to chest to open up the pelvic floor          PATIENT EDUCATION:  06/16/24 Education details:  Access Code: [email or web address] Person educated: Patient Education method: Programmer, Multimedia, Demonstration, Actor cues, Verbal cues, and Handouts Education comprehension: verbalized understanding, returned demonstration, verbal cues required, tactile cues required, and needs further education  HOME EXERCISE PROGRAM: 06/16/24 Access Code: [email or web address] URL: [email or web address] Date: 06/16/2024 Prepared by: Channing Pereyra  Exercises - Supine Diaphragmatic Breathing  - 3 x daily - 7 x weekly - 1 sets - 10 reps - Supine Pelvic Floor Contraction  - 1 x daily - 7 x weekly - 1 sets - 10 reps - 5 sec hold - Supine Bridge  - 1 x daily - 7 x weekly - 1 sets - 10 reps - Supine Single Knee to Chest  - 1 x daily - 7 x weekly - 1 sets - 2 reps - 30 sec hold - Supine Butterfly Groin Stretch  - 1 x daily - 7 x weekly - 1 sets - 2 reps - 30 sec hold  Patient Education - Abdominal Massage for Constipation    ASSESSMENT:  CLINICAL IMPRESSION: Patient is a 74 y.o. F who was seen today for physical therapy  and treatment for chronic constipation.  Patient is straining to have a bowel movement 50% of the time.  She is learning how to contract her pelvic floor and isolate the rectum from the vaginal. She has difficulty with breathing out to generate enough force to push the stool out of the rectum. She needs manya verbal cues for this. She will benefit from PT to address deficits.   OBJECTIVE IMPAIRMENTS: Abnormal gait, decreased balance, decreased coordination, decreased endurance, decreased knowledge of condition, decreased mobility, difficulty walking, decreased ROM, decreased strength, hypomobility, increased fascial restrictions, increased muscle spasms, impaired sensation, impaired tone, and pain.   ACTIVITY LIMITATIONS: carrying, lifting, transfers, bed mobility, continence, toileting, and locomotion level  PARTICIPATION LIMITATIONS: community  activity and church  PERSONAL FACTORS: Age, Behavior pattern, Time since onset of injury/illness/exacerbation, and Transportation are also affecting patient's functional outcome.   REHAB POTENTIAL: Good  CLINICAL DECISION MAKING: Evolving/moderate complexity  EVALUATION COMPLEXITY:  Moderate   GOALS: Goals reviewed with patient? Yes  SHORT TERM GOALS: Target date: 04/26/2024    Pt will be independent with HEP.   Baseline: Goal status: Met 06/16/24  2.  Pt will be independent with use of squatty potty, relaxed toileting mechanics, and improved bowel movement techniques in order to increase ease of bowel movements and complete evacuation.   Baseline:  Goal status: ongoing 07/21/24  3.   Pt will demonstrate appropriate lateral rib cage excursion with inhale to ensure better abdominal pressure management and pelvic floor/abdominal muscle relaxation.   Baseline:  Goal status: Met 07/21/24  4.  Patient will be educated on abdominal massage Baseline:  Goal status: Met 06/09/24  5.  Pt will have reduced low back pain to max 1/10 in order to be able to do functional activities such as bending, lifting, twisting and walking as needed to be able to take care of her family and participate in church and community activities Baseline:  Goal status: ongoing 07/21/24   LONG TERM GOALS: Target date: 09/20/24    Pt will demonstrate 20 point improvement in PFIQ-7 score in order to show functional improvement in bowel symptoms.   Baseline:  Goal status: ongoing 07/21/24  2.   Pt will be I and consistent with her HEP and demonstrate all exercises correctly  Baseline:  Goal status: ongoing 07/21/24  3.  Pt will be able to have a bowel movement within 2-3 minutes so she is not straining and adding pressure on the pelvic floor due to the ability to lengthen the tissue and generate  pressure to push the stool out  Baseline:  Goal status: ongoing 07/21/24  4.  Patient will report bristol stool  scale 3-4  Baseline:  Goal status: ongoing 07/21/24  5.  Patient will be I with PT healthy Bowel PT recommendations Baseline:  Goal status: ongoing 07/21/24  6.  Patient will report regular bowel movements - at least 5 times/ week Baseline:  Goal status: ongoing 07/21/24  PLAN:  PT FREQUENCY: 1-2x/week  PT DURATION: 12 weeks  PLANNED INTERVENTIONS: 97110-Therapeutic exercises, 97530- Therapeutic activity, 97112- Neuromuscular re-education, 97535- Self Care, [id number]- Manual therapy, [phone]- Electrical stimulation (manual), [phone]- Ionotophoresis 4mg /ml Dexamethasone , 79439 (1-2 muscles), 20561 (3+ muscles)- Dry Needling, Patient/Family education, Balance training, Taping, Joint mobilization, Joint manipulation, Spinal manipulation, Spinal mobilization, Manual lymph drainage, Scar mobilization, Cryotherapy, Moist heat, and Biofeedback  PLAN FOR NEXT SESSION:  core coordination exercises, diaphragmatic breathing,  how to have a bowel movement without straining, go over healthy bowel regimen  Channing Pereyra, PT 07/28/24 3:44 PM  "

## 2024-07-29 ENCOUNTER — Encounter: Payer: Self-pay | Admitting: Internal Medicine

## 2024-07-29 MED ORDER — FOLIC ACID 1 MG PO TABS: 1.0000 mg | ORAL_TABLET | Freq: Every day | ORAL | 5 refills | Status: AC

## 2024-08-04 ENCOUNTER — Encounter: Payer: Self-pay | Attending: Physician Assistant | Admitting: Physical Therapy

## 2024-08-11 ENCOUNTER — Encounter: Attending: Physician Assistant | Admitting: Physical Therapy

## 2024-08-25 ENCOUNTER — Inpatient Hospital Stay: Attending: Internal Medicine

## 2024-10-19 ENCOUNTER — Ambulatory Visit: Admitting: Internal Medicine

## 2024-11-30 ENCOUNTER — Other Ambulatory Visit

## 2024-12-05 ENCOUNTER — Inpatient Hospital Stay: Attending: Internal Medicine

## 2024-12-15 ENCOUNTER — Inpatient Hospital Stay: Admitting: Internal Medicine

## 2025-04-25 ENCOUNTER — Ambulatory Visit

## 2025-04-25 ENCOUNTER — Encounter: Admitting: Internal Medicine
